# Patient Record
Sex: Female | Born: 1955
Health system: Southern US, Community
[De-identification: ages and names within clinical notes are randomized; demographics above are authoritative.]

## PROBLEM LIST (undated history)

## (undated) DIAGNOSIS — E119 Type 2 diabetes mellitus without complications: Secondary | ICD-10-CM

## (undated) DIAGNOSIS — Z992 Dependence on renal dialysis: Secondary | ICD-10-CM

## (undated) DIAGNOSIS — F329 Major depressive disorder, single episode, unspecified: Secondary | ICD-10-CM

## (undated) DIAGNOSIS — F32A Depression, unspecified: Secondary | ICD-10-CM

## (undated) DIAGNOSIS — M1990 Unspecified osteoarthritis, unspecified site: Secondary | ICD-10-CM

## (undated) DIAGNOSIS — N186 End stage renal disease: Secondary | ICD-10-CM

## (undated) DIAGNOSIS — E669 Obesity, unspecified: Secondary | ICD-10-CM

## (undated) DIAGNOSIS — N189 Chronic kidney disease, unspecified: Secondary | ICD-10-CM

## (undated) DIAGNOSIS — Z9889 Other specified postprocedural states: Secondary | ICD-10-CM

## (undated) DIAGNOSIS — Z789 Other specified health status: Secondary | ICD-10-CM

## (undated) DIAGNOSIS — F419 Anxiety disorder, unspecified: Secondary | ICD-10-CM

## (undated) DIAGNOSIS — R519 Headache, unspecified: Secondary | ICD-10-CM

## (undated) DIAGNOSIS — Z9981 Dependence on supplemental oxygen: Secondary | ICD-10-CM

## (undated) DIAGNOSIS — C50912 Malignant neoplasm of unspecified site of left female breast: Secondary | ICD-10-CM

## (undated) DIAGNOSIS — J189 Pneumonia, unspecified organism: Secondary | ICD-10-CM

## (undated) DIAGNOSIS — J449 Chronic obstructive pulmonary disease, unspecified: Secondary | ICD-10-CM

## (undated) DIAGNOSIS — I428 Other cardiomyopathies: Secondary | ICD-10-CM

## (undated) DIAGNOSIS — I10 Essential (primary) hypertension: Secondary | ICD-10-CM

## (undated) DIAGNOSIS — Z7902 Long term (current) use of antithrombotics/antiplatelets: Secondary | ICD-10-CM

## (undated) DIAGNOSIS — R0600 Dyspnea, unspecified: Secondary | ICD-10-CM

## (undated) DIAGNOSIS — Z7901 Long term (current) use of anticoagulants: Secondary | ICD-10-CM

## (undated) DIAGNOSIS — R112 Nausea with vomiting, unspecified: Secondary | ICD-10-CM

## (undated) DIAGNOSIS — I452 Bifascicular block: Secondary | ICD-10-CM

## (undated) DIAGNOSIS — D649 Anemia, unspecified: Secondary | ICD-10-CM

## (undated) DIAGNOSIS — I700 Atherosclerosis of aorta: Secondary | ICD-10-CM

## (undated) DIAGNOSIS — I509 Heart failure, unspecified: Secondary | ICD-10-CM

## (undated) DIAGNOSIS — I639 Cerebral infarction, unspecified: Secondary | ICD-10-CM

## (undated) DIAGNOSIS — Z794 Long term (current) use of insulin: Secondary | ICD-10-CM

## (undated) DIAGNOSIS — T7840XA Allergy, unspecified, initial encounter: Secondary | ICD-10-CM

## (undated) DIAGNOSIS — D631 Anemia in chronic kidney disease: Secondary | ICD-10-CM

## (undated) DIAGNOSIS — T8859XA Other complications of anesthesia, initial encounter: Secondary | ICD-10-CM

## (undated) DIAGNOSIS — M810 Age-related osteoporosis without current pathological fracture: Secondary | ICD-10-CM

## (undated) DIAGNOSIS — I5020 Unspecified systolic (congestive) heart failure: Secondary | ICD-10-CM

## (undated) DIAGNOSIS — I429 Cardiomyopathy, unspecified: Secondary | ICD-10-CM

## (undated) DIAGNOSIS — I739 Peripheral vascular disease, unspecified: Secondary | ICD-10-CM

## (undated) DIAGNOSIS — D72829 Elevated white blood cell count, unspecified: Secondary | ICD-10-CM

## (undated) HISTORY — DX: Chronic kidney disease, unspecified: N18.9

## (undated) HISTORY — DX: Depression, unspecified: F32.A

## (undated) HISTORY — DX: Cardiomyopathy, unspecified: I42.9

## (undated) HISTORY — PX: ABDOMINAL HYSTERECTOMY: SHX81

## (undated) HISTORY — DX: Essential (primary) hypertension: I10

## (undated) HISTORY — DX: Age-related osteoporosis without current pathological fracture: M81.0

## (undated) HISTORY — DX: Chronic obstructive pulmonary disease, unspecified: J44.9

## (undated) HISTORY — DX: Anemia, unspecified: D64.9

## (undated) HISTORY — DX: Heart failure, unspecified: I50.9

## (undated) HISTORY — DX: Type 2 diabetes mellitus without complications: E11.9

## (undated) HISTORY — DX: Obesity, unspecified: E66.9

## (undated) HISTORY — DX: Elevated white blood cell count, unspecified: D72.829

## (undated) HISTORY — DX: Anxiety disorder, unspecified: F41.9

## (undated) HISTORY — DX: Major depressive disorder, single episode, unspecified: F32.9

## (undated) HISTORY — PX: FRACTURE SURGERY: SHX138

## (undated) HISTORY — PX: US LT BREAST BX W LOC DEV 1ST LESION IMG BX SPEC US GUIDE: IMG5431

## (undated) HISTORY — DX: Allergy, unspecified, initial encounter: T78.40XA

## (undated) HISTORY — DX: Cerebral infarction, unspecified: I63.9

## (undated) MED FILL — Ferric Derisomaltose (One Dose) IV Sol 1000 MG/10ML (Fe Eq): INTRAVENOUS | Qty: 10 | Status: AC

---

## 1976-02-09 HISTORY — PX: TUBAL LIGATION: SHX77

## 1998-02-08 HISTORY — PX: BILATERAL SALPINGOOPHORECTOMY: SHX1223

## 2000-02-09 HISTORY — PX: ANKLE FRACTURE SURGERY: SHX122

## 2003-02-09 HISTORY — PX: KNEE ARTHROSCOPY: SUR90

## 2005-10-22 DIAGNOSIS — E785 Hyperlipidemia, unspecified: Secondary | ICD-10-CM | POA: Insufficient documentation

## 2005-10-22 DIAGNOSIS — M1990 Unspecified osteoarthritis, unspecified site: Secondary | ICD-10-CM | POA: Insufficient documentation

## 2005-10-22 DIAGNOSIS — E1169 Type 2 diabetes mellitus with other specified complication: Secondary | ICD-10-CM | POA: Insufficient documentation

## 2005-10-22 DIAGNOSIS — IMO0002 Reserved for concepts with insufficient information to code with codable children: Secondary | ICD-10-CM | POA: Insufficient documentation

## 2005-10-22 DIAGNOSIS — E1165 Type 2 diabetes mellitus with hyperglycemia: Secondary | ICD-10-CM | POA: Insufficient documentation

## 2005-10-22 DIAGNOSIS — F329 Major depressive disorder, single episode, unspecified: Secondary | ICD-10-CM | POA: Insufficient documentation

## 2005-10-22 DIAGNOSIS — F32A Depression, unspecified: Secondary | ICD-10-CM | POA: Insufficient documentation

## 2005-10-22 DIAGNOSIS — Z720 Tobacco use: Secondary | ICD-10-CM | POA: Insufficient documentation

## 2006-02-08 HISTORY — PX: ULNAR NERVE TRANSPOSITION: SHX2595

## 2006-05-27 DIAGNOSIS — R519 Headache, unspecified: Secondary | ICD-10-CM | POA: Insufficient documentation

## 2006-06-27 DIAGNOSIS — G43109 Migraine with aura, not intractable, without status migrainosus: Secondary | ICD-10-CM | POA: Insufficient documentation

## 2006-07-25 DIAGNOSIS — H9190 Unspecified hearing loss, unspecified ear: Secondary | ICD-10-CM | POA: Insufficient documentation

## 2006-08-04 ENCOUNTER — Emergency Department: Payer: Self-pay | Admitting: Unknown Physician Specialty

## 2006-09-02 DIAGNOSIS — R209 Unspecified disturbances of skin sensation: Secondary | ICD-10-CM | POA: Insufficient documentation

## 2006-11-18 ENCOUNTER — Ambulatory Visit: Payer: Self-pay | Admitting: Unknown Physician Specialty

## 2006-11-18 ENCOUNTER — Other Ambulatory Visit: Payer: Self-pay

## 2006-11-23 ENCOUNTER — Ambulatory Visit: Payer: Self-pay | Admitting: Unknown Physician Specialty

## 2007-04-18 ENCOUNTER — Emergency Department: Payer: Self-pay | Admitting: Emergency Medicine

## 2007-06-01 DIAGNOSIS — R197 Diarrhea, unspecified: Secondary | ICD-10-CM | POA: Insufficient documentation

## 2007-06-01 DIAGNOSIS — K921 Melena: Secondary | ICD-10-CM | POA: Insufficient documentation

## 2007-06-05 ENCOUNTER — Ambulatory Visit: Payer: Self-pay | Admitting: Family Medicine

## 2007-06-08 DIAGNOSIS — IMO0002 Reserved for concepts with insufficient information to code with codable children: Secondary | ICD-10-CM | POA: Insufficient documentation

## 2008-08-29 ENCOUNTER — Ambulatory Visit: Payer: Self-pay | Admitting: Family Medicine

## 2008-12-03 ENCOUNTER — Ambulatory Visit: Payer: Self-pay | Admitting: Family Medicine

## 2009-02-05 ENCOUNTER — Ambulatory Visit: Payer: Self-pay | Admitting: Family Medicine

## 2009-03-31 DIAGNOSIS — S0083XA Contusion of other part of head, initial encounter: Secondary | ICD-10-CM | POA: Insufficient documentation

## 2009-11-07 ENCOUNTER — Ambulatory Visit: Payer: Self-pay

## 2010-03-09 ENCOUNTER — Ambulatory Visit: Payer: Self-pay

## 2010-04-16 ENCOUNTER — Ambulatory Visit: Payer: Self-pay

## 2011-04-21 ENCOUNTER — Ambulatory Visit: Payer: Self-pay | Admitting: Sports Medicine

## 2011-04-28 ENCOUNTER — Ambulatory Visit: Payer: Self-pay | Admitting: Family Medicine

## 2012-03-27 ENCOUNTER — Ambulatory Visit: Payer: Self-pay | Admitting: Family Medicine

## 2012-05-23 ENCOUNTER — Emergency Department: Payer: Self-pay | Admitting: Emergency Medicine

## 2012-11-23 ENCOUNTER — Ambulatory Visit: Payer: Self-pay | Admitting: Family Medicine

## 2013-06-22 LAB — HEMOGLOBIN A1C: Hgb A1c MFr Bld: 13.3 % — AB (ref 4.0–6.0)

## 2013-10-11 DIAGNOSIS — E1165 Type 2 diabetes mellitus with hyperglycemia: Secondary | ICD-10-CM | POA: Diagnosis not present

## 2013-10-11 DIAGNOSIS — E11319 Type 2 diabetes mellitus with unspecified diabetic retinopathy without macular edema: Secondary | ICD-10-CM | POA: Diagnosis not present

## 2013-10-11 DIAGNOSIS — R809 Proteinuria, unspecified: Secondary | ICD-10-CM | POA: Diagnosis not present

## 2013-10-11 DIAGNOSIS — E1139 Type 2 diabetes mellitus with other diabetic ophthalmic complication: Secondary | ICD-10-CM | POA: Diagnosis not present

## 2013-10-11 DIAGNOSIS — F17200 Nicotine dependence, unspecified, uncomplicated: Secondary | ICD-10-CM | POA: Diagnosis not present

## 2013-12-04 DIAGNOSIS — F5109 Other insomnia not due to a substance or known physiological condition: Secondary | ICD-10-CM | POA: Diagnosis not present

## 2013-12-04 DIAGNOSIS — F331 Major depressive disorder, recurrent, moderate: Secondary | ICD-10-CM | POA: Diagnosis not present

## 2013-12-04 DIAGNOSIS — F411 Generalized anxiety disorder: Secondary | ICD-10-CM | POA: Diagnosis not present

## 2013-12-17 DIAGNOSIS — L304 Erythema intertrigo: Secondary | ICD-10-CM | POA: Diagnosis not present

## 2013-12-17 DIAGNOSIS — K130 Diseases of lips: Secondary | ICD-10-CM | POA: Diagnosis not present

## 2013-12-17 DIAGNOSIS — L400 Psoriasis vulgaris: Secondary | ICD-10-CM | POA: Diagnosis not present

## 2013-12-17 DIAGNOSIS — L4052 Psoriatic arthritis mutilans: Secondary | ICD-10-CM | POA: Diagnosis not present

## 2013-12-19 DIAGNOSIS — R809 Proteinuria, unspecified: Secondary | ICD-10-CM | POA: Diagnosis not present

## 2013-12-19 DIAGNOSIS — Z794 Long term (current) use of insulin: Secondary | ICD-10-CM | POA: Diagnosis not present

## 2013-12-19 DIAGNOSIS — E1129 Type 2 diabetes mellitus with other diabetic kidney complication: Secondary | ICD-10-CM | POA: Diagnosis not present

## 2013-12-19 DIAGNOSIS — Z23 Encounter for immunization: Secondary | ICD-10-CM | POA: Diagnosis not present

## 2013-12-19 DIAGNOSIS — F17200 Nicotine dependence, unspecified, uncomplicated: Secondary | ICD-10-CM | POA: Diagnosis not present

## 2013-12-23 DIAGNOSIS — F17200 Nicotine dependence, unspecified, uncomplicated: Secondary | ICD-10-CM | POA: Insufficient documentation

## 2013-12-23 DIAGNOSIS — IMO0002 Reserved for concepts with insufficient information to code with codable children: Secondary | ICD-10-CM | POA: Insufficient documentation

## 2013-12-23 DIAGNOSIS — R809 Proteinuria, unspecified: Secondary | ICD-10-CM | POA: Insufficient documentation

## 2013-12-23 DIAGNOSIS — E1129 Type 2 diabetes mellitus with other diabetic kidney complication: Secondary | ICD-10-CM | POA: Insufficient documentation

## 2013-12-23 DIAGNOSIS — E1165 Type 2 diabetes mellitus with hyperglycemia: Principal | ICD-10-CM

## 2013-12-23 DIAGNOSIS — Z794 Long term (current) use of insulin: Secondary | ICD-10-CM | POA: Insufficient documentation

## 2014-03-06 DIAGNOSIS — L400 Psoriasis vulgaris: Secondary | ICD-10-CM | POA: Diagnosis not present

## 2014-03-06 DIAGNOSIS — K130 Diseases of lips: Secondary | ICD-10-CM | POA: Diagnosis not present

## 2014-03-07 DIAGNOSIS — F331 Major depressive disorder, recurrent, moderate: Secondary | ICD-10-CM | POA: Diagnosis not present

## 2014-03-22 DIAGNOSIS — R197 Diarrhea, unspecified: Secondary | ICD-10-CM | POA: Diagnosis not present

## 2014-03-22 DIAGNOSIS — J0100 Acute maxillary sinusitis, unspecified: Secondary | ICD-10-CM | POA: Diagnosis not present

## 2014-03-22 LAB — CBC AND DIFFERENTIAL
HCT: 38 % (ref 36–46)
Hemoglobin: 12.4 g/dL (ref 12.0–16.0)
Neutrophils Absolute: 71 /uL
PLATELETS: 151 10*3/uL (ref 150–399)
WBC: 11.4 10*3/mL

## 2014-03-22 LAB — BASIC METABOLIC PANEL
BUN: 11 mg/dL (ref 4–21)
Creatinine: 1 mg/dL (ref 0.5–1.1)
Glucose: 348 mg/dL
Potassium: 4.2 mmol/L (ref 3.4–5.3)
SODIUM: 137 mmol/L (ref 137–147)

## 2014-04-10 DIAGNOSIS — L400 Psoriasis vulgaris: Secondary | ICD-10-CM | POA: Diagnosis not present

## 2014-04-23 DIAGNOSIS — L400 Psoriasis vulgaris: Secondary | ICD-10-CM | POA: Diagnosis not present

## 2014-04-23 DIAGNOSIS — L4052 Psoriatic arthritis mutilans: Secondary | ICD-10-CM | POA: Diagnosis not present

## 2014-04-25 DIAGNOSIS — L400 Psoriasis vulgaris: Secondary | ICD-10-CM | POA: Diagnosis not present

## 2014-05-02 DIAGNOSIS — L400 Psoriasis vulgaris: Secondary | ICD-10-CM | POA: Diagnosis not present

## 2014-05-07 DIAGNOSIS — F331 Major depressive disorder, recurrent, moderate: Secondary | ICD-10-CM | POA: Diagnosis not present

## 2014-05-09 DIAGNOSIS — L400 Psoriasis vulgaris: Secondary | ICD-10-CM | POA: Diagnosis not present

## 2014-05-14 DIAGNOSIS — L400 Psoriasis vulgaris: Secondary | ICD-10-CM | POA: Diagnosis not present

## 2014-05-16 DIAGNOSIS — L400 Psoriasis vulgaris: Secondary | ICD-10-CM | POA: Diagnosis not present

## 2014-05-21 DIAGNOSIS — L400 Psoriasis vulgaris: Secondary | ICD-10-CM | POA: Diagnosis not present

## 2014-05-23 DIAGNOSIS — L400 Psoriasis vulgaris: Secondary | ICD-10-CM | POA: Diagnosis not present

## 2014-05-28 DIAGNOSIS — L400 Psoriasis vulgaris: Secondary | ICD-10-CM | POA: Diagnosis not present

## 2014-05-30 DIAGNOSIS — L400 Psoriasis vulgaris: Secondary | ICD-10-CM | POA: Diagnosis not present

## 2014-06-05 DIAGNOSIS — Z8679 Personal history of other diseases of the circulatory system: Secondary | ICD-10-CM | POA: Insufficient documentation

## 2014-06-05 DIAGNOSIS — J449 Chronic obstructive pulmonary disease, unspecified: Secondary | ICD-10-CM | POA: Insufficient documentation

## 2014-06-05 DIAGNOSIS — L4052 Psoriatic arthritis mutilans: Secondary | ICD-10-CM | POA: Diagnosis not present

## 2014-06-05 DIAGNOSIS — G4709 Other insomnia: Secondary | ICD-10-CM | POA: Insufficient documentation

## 2014-06-05 DIAGNOSIS — Z8669 Personal history of other diseases of the nervous system and sense organs: Secondary | ICD-10-CM | POA: Insufficient documentation

## 2014-06-05 DIAGNOSIS — F331 Major depressive disorder, recurrent, moderate: Secondary | ICD-10-CM | POA: Insufficient documentation

## 2014-06-05 DIAGNOSIS — F411 Generalized anxiety disorder: Secondary | ICD-10-CM | POA: Insufficient documentation

## 2014-06-05 DIAGNOSIS — Z8739 Personal history of other diseases of the musculoskeletal system and connective tissue: Secondary | ICD-10-CM | POA: Insufficient documentation

## 2014-06-05 DIAGNOSIS — Z8639 Personal history of other endocrine, nutritional and metabolic disease: Secondary | ICD-10-CM | POA: Insufficient documentation

## 2014-06-05 DIAGNOSIS — Z79899 Other long term (current) drug therapy: Secondary | ICD-10-CM | POA: Diagnosis not present

## 2014-06-05 DIAGNOSIS — L400 Psoriasis vulgaris: Secondary | ICD-10-CM | POA: Diagnosis not present

## 2014-06-10 DIAGNOSIS — E1129 Type 2 diabetes mellitus with other diabetic kidney complication: Secondary | ICD-10-CM | POA: Diagnosis not present

## 2014-06-10 DIAGNOSIS — E1165 Type 2 diabetes mellitus with hyperglycemia: Secondary | ICD-10-CM | POA: Diagnosis not present

## 2014-06-11 DIAGNOSIS — L400 Psoriasis vulgaris: Secondary | ICD-10-CM | POA: Diagnosis not present

## 2014-06-17 DIAGNOSIS — Z794 Long term (current) use of insulin: Secondary | ICD-10-CM | POA: Diagnosis not present

## 2014-06-17 DIAGNOSIS — E1129 Type 2 diabetes mellitus with other diabetic kidney complication: Secondary | ICD-10-CM | POA: Diagnosis not present

## 2014-06-17 DIAGNOSIS — F17200 Nicotine dependence, unspecified, uncomplicated: Secondary | ICD-10-CM | POA: Diagnosis not present

## 2014-06-17 DIAGNOSIS — E11319 Type 2 diabetes mellitus with unspecified diabetic retinopathy without macular edema: Secondary | ICD-10-CM | POA: Diagnosis not present

## 2014-06-18 DIAGNOSIS — L400 Psoriasis vulgaris: Secondary | ICD-10-CM | POA: Diagnosis not present

## 2014-06-20 DIAGNOSIS — L400 Psoriasis vulgaris: Secondary | ICD-10-CM | POA: Diagnosis not present

## 2014-06-27 DIAGNOSIS — R197 Diarrhea, unspecified: Secondary | ICD-10-CM | POA: Diagnosis not present

## 2014-06-27 DIAGNOSIS — L400 Psoriasis vulgaris: Secondary | ICD-10-CM | POA: Diagnosis not present

## 2014-06-27 DIAGNOSIS — J0100 Acute maxillary sinusitis, unspecified: Secondary | ICD-10-CM | POA: Diagnosis not present

## 2014-07-02 DIAGNOSIS — L400 Psoriasis vulgaris: Secondary | ICD-10-CM | POA: Diagnosis not present

## 2014-07-05 DIAGNOSIS — I152 Hypertension secondary to endocrine disorders: Secondary | ICD-10-CM | POA: Insufficient documentation

## 2014-07-05 DIAGNOSIS — I10 Essential (primary) hypertension: Secondary | ICD-10-CM | POA: Insufficient documentation

## 2014-07-05 DIAGNOSIS — R030 Elevated blood-pressure reading, without diagnosis of hypertension: Secondary | ICD-10-CM

## 2014-07-05 DIAGNOSIS — E1159 Type 2 diabetes mellitus with other circulatory complications: Secondary | ICD-10-CM | POA: Insufficient documentation

## 2014-07-05 DIAGNOSIS — L0292 Furuncle, unspecified: Secondary | ICD-10-CM | POA: Insufficient documentation

## 2014-07-05 DIAGNOSIS — R6889 Other general symptoms and signs: Secondary | ICD-10-CM | POA: Insufficient documentation

## 2014-07-05 DIAGNOSIS — L0293 Carbuncle, unspecified: Secondary | ICD-10-CM

## 2014-07-05 DIAGNOSIS — K6289 Other specified diseases of anus and rectum: Secondary | ICD-10-CM | POA: Insufficient documentation

## 2014-07-05 DIAGNOSIS — R42 Dizziness and giddiness: Secondary | ICD-10-CM | POA: Insufficient documentation

## 2014-07-05 DIAGNOSIS — L404 Guttate psoriasis: Secondary | ICD-10-CM | POA: Insufficient documentation

## 2014-07-05 DIAGNOSIS — M25569 Pain in unspecified knee: Secondary | ICD-10-CM | POA: Insufficient documentation

## 2014-07-09 DIAGNOSIS — Z794 Long term (current) use of insulin: Secondary | ICD-10-CM | POA: Diagnosis not present

## 2014-07-09 DIAGNOSIS — R809 Proteinuria, unspecified: Secondary | ICD-10-CM | POA: Diagnosis not present

## 2014-07-09 DIAGNOSIS — E1129 Type 2 diabetes mellitus with other diabetic kidney complication: Secondary | ICD-10-CM | POA: Diagnosis not present

## 2014-07-09 DIAGNOSIS — F17200 Nicotine dependence, unspecified, uncomplicated: Secondary | ICD-10-CM | POA: Diagnosis not present

## 2014-07-10 DIAGNOSIS — L400 Psoriasis vulgaris: Secondary | ICD-10-CM | POA: Diagnosis not present

## 2014-07-10 DIAGNOSIS — L4052 Psoriatic arthritis mutilans: Secondary | ICD-10-CM | POA: Diagnosis not present

## 2014-07-16 DIAGNOSIS — L400 Psoriasis vulgaris: Secondary | ICD-10-CM | POA: Diagnosis not present

## 2014-07-23 DIAGNOSIS — L400 Psoriasis vulgaris: Secondary | ICD-10-CM | POA: Diagnosis not present

## 2014-08-02 ENCOUNTER — Other Ambulatory Visit: Payer: Self-pay | Admitting: Family Medicine

## 2014-08-03 ENCOUNTER — Other Ambulatory Visit: Payer: Self-pay | Admitting: Family Medicine

## 2014-08-06 ENCOUNTER — Other Ambulatory Visit: Payer: Self-pay

## 2014-08-06 ENCOUNTER — Encounter: Payer: Self-pay | Admitting: Psychiatry

## 2014-08-06 ENCOUNTER — Ambulatory Visit (INDEPENDENT_AMBULATORY_CARE_PROVIDER_SITE_OTHER): Payer: Medicare Other | Admitting: Psychiatry

## 2014-08-06 VITALS — BP 140/92 | HR 102 | Temp 97.4°F | Ht 66.0 in | Wt 216.8 lb

## 2014-08-06 DIAGNOSIS — F331 Major depressive disorder, recurrent, moderate: Secondary | ICD-10-CM

## 2014-08-06 DIAGNOSIS — L400 Psoriasis vulgaris: Secondary | ICD-10-CM | POA: Diagnosis not present

## 2014-08-06 MED ORDER — PAROXETINE HCL 40 MG PO TABS: 40.0000 mg | ORAL_TABLET | Freq: Every day | ORAL | Status: DC

## 2014-08-06 MED ORDER — CLONAZEPAM 0.5 MG PO TABS: 0.5000 mg | ORAL_TABLET | Freq: Every day | ORAL | Status: DC

## 2014-08-06 NOTE — Progress Notes (Signed)
BH MD/PA/NP OP Progress Note  08/06/2014 1:22 PM Jocelyn Wells  MRN:  [id number]  Subjective:   Pt is  a 59 year old female who presented for follow-up appointment.her blood pressure was slightly elevated at the time of this interview. She reported that she has been having severe headache for the past couple of days since she came back from the cruise trip. She reported that she has been trying to get some rest and has not been eating well for the past couple of days. She is compliant with her blood pressure medications. She reported that she has never been diagnosed with migraine headaches in the past. Patient reported that she is not sleeping well and takes Klonopin on a when necessary basis. She also takes Paxil to help with her depression and anxiety and is not having any adverse effects related to the medication. Patient reported that she will follow-up with her primary care physician regarding her elevated blood pressure.   Chief Complaint:  Chief Complaint    Follow-up; Depression     Visit Diagnosis:     ICD-9-CM ICD-10-CM   1. MDD (major depressive disorder), recurrent episode, moderate 296.32 F33.1     Past Medical History:  Past Medical History  Diagnosis Date  . Anxiety   . Depression   . Diabetes mellitus, type II     Past Surgical History  Procedure Laterality Date  . Abdominal hysterectomy    . Tubal ligation  1978  . Cesarean section  1978  . Knee arthroscopy Left 2005  . Ulnar nerve transposition  2008  . Bilateral salpingoophorectomy  2000  . Ankle fracture surgery Left 2002   Family History:  Family History  Problem Relation Age of Onset  . Heart disease Mother     died from CHF  . Asthma Mother   . Diabetes Mother   . Heart disease Father   . Aneurysm Father   . Diabetes Brother   . Alcohol abuse Paternal Aunt    Social History:  History   Social History  . Marital Status: Married    Spouse Name: N/A  . Number of Children: N/A  . Years of  Education: N/A   Social History Main Topics  . Smoking status: Current Every Day Smoker -- 1.00 packs/day for 35 years    Types: Cigarettes    Start date: 02/08/1974  . Smokeless tobacco: Never Used  . Alcohol Use: No  . Drug Use: No  . Sexual Activity: Yes    Birth Control/ Protection: None   Other Topics Concern  . None   Social History Narrative   Additional History:  Married x 18 years. Has 2 children and Her husband has 2 children as well.she went to the cruise trip with her grandchildren.    Assessment:   Musculoskeletal: Strength & Muscle Tone: within normal limits Gait & Station: normal Patient leans: N/A  Psychiatric Specialty Exam: HPI  Review of Systems  Constitutional: Negative for chills.  HENT: Negative for nosebleeds and tinnitus.   Eyes: Negative for double vision and discharge.  Respiratory: Positive for cough.   Cardiovascular: Negative for orthopnea.  Gastrointestinal: Positive for nausea, vomiting and diarrhea. Negative for blood in stool.  Genitourinary: Negative for urgency.  Musculoskeletal: Positive for joint pain. Negative for back pain.  Skin: Negative for rash.  Neurological: Positive for headaches. Negative for tingling, sensory change and focal weakness.  Endo/Heme/Allergies: Negative for environmental allergies.  Psychiatric/Behavioral: Positive for depression. The patient is nervous/anxious.  Blood pressure 140/92, pulse 102, temperature 97.4 F (36.3 C), temperature source Tympanic, height 5\' 6"  (1.676 m), weight 216 lb 12.8 oz (98.34 kg), SpO2 91 %.Body mass index is 35.01 kg/(m^2).  General Appearance: Casual  Eye Contact:  Fair  Speech:  Clear and Coherent  Volume:  Normal  Mood:  Depressed  Affect:  Depressed  Thought Process:  Coherent  Orientation:  Full (Time, Place, and Person)  Thought Content:  WDL  Suicidal Thoughts:  No  Homicidal Thoughts:  No  Memory:  Immediate;   Fair  Judgement:  Fair  Insight:  Fair   Psychomotor Activity:  Normal  Concentration:  Fair  Recall:  [employer] of Knowledge: Fair  Language: Fair  Akathisia:  No  Handed:  Right  AIMS (if indicated):  none  Assets:  Communication Skills Desire for Improvement Physical Health Social Support  ADL's:  Intact  Cognition: WNL  Sleep:  6-7   Is the patient at risk to self?  No. Has the patient been a risk to self in the past 6 months?  No. Has the patient been a risk to self within the distant past?  No. Is the patient a risk to others?  No. Has the patient been a risk to others in the past 6 months?  No. Has the patient been a risk to others within the distant past?  No.  Current Medications: Current Outpatient Prescriptions  Medication Sig Dispense Refill  . atorvastatin (LIPITOR) 10 MG tablet Take 1 tablet by mouth daily.    . benzonatate (TESSALON) 100 MG capsule TAKE 1 CAPSULE BY MOUTH AT BEDTIME 30 capsule 1  . clonazePAM (KLONOPIN) 0.5 MG tablet Take 1 tablet by mouth at bedtime.    . fluticasone (FLONASE) 50 MCG/ACT nasal spray Place 2 sprays into the nose daily.    Marland Kitchen glyBURIDE-metformin (GLUCOVANCE) 5-500 MG per tablet Take 1 tablet by mouth 2 (two) times daily.    . Insulin Glargine (LANTUS) 100 UNIT/ML Solostar Pen Inject 52 Units into the skin daily.     . Insulin Glargine 300 UNIT/ML SOPN Inject 300 Units into the skin daily.     Marland Kitchen ketoconazole (NIZORAL) 2 % cream     . loratadine (CLARITIN) 10 MG tablet Take 1 tablet by mouth daily.    Marland Kitchen losartan (COZAAR) 50 MG tablet Take 1 tablet by mouth daily.    . ONE TOUCH ULTRA TEST test strip     . ONETOUCH DELICA LANCETS FINE MISC     . PARoxetine (PAXIL) 40 MG tablet Take 1 tablet by mouth daily.    . simvastatin (ZOCOR) 40 MG tablet Take 1 tablet by mouth daily.    Marland Kitchen amoxicillin-clavulanate (AUGMENTIN) 875-125 MG per tablet     . Apremilast 30 MG TABS Take 2 tablets by mouth daily.    . benzonatate (TESSALON) 100 MG capsule TAKE 1 CAPSULE BY MOUTH AT BEDTIME  FOR COUGH (Patient not taking: Reported on 08/06/2014) 30 capsule 1  . benzonatate (TESSALON) 100 MG capsule TAKE ONE CAPSULE BY MOUTH AT BEDTIME (Patient not taking: Reported on 08/06/2014) 30 capsule 1  . benzonatate (TESSALON) 100 MG capsule TAKE ONE CAPSULE BY MOUTH EVERY NIGHT AT BEDTIME (Patient not taking: Reported on 08/06/2014) 30 capsule 1  . benzonatate (TESSALON) 100 MG capsule TAKE ONE CAPSULE BY MOUTH AT BEDTIME (Patient not taking: Reported on 08/06/2014) 30 capsule 1  . diclofenac (VOLTAREN) 75 MG EC tablet Take 1 tablet by mouth 2 (two) times  daily as needed.     No current facility-administered medications for this visit.    Medical Decision Making:  Established Problem, Stable/Improving (1), Review of Psycho-Social Stressors (1) and Review of Last Therapy Session (1)  Treatment Plan Summary:Medication management  Discussed with patient about her medications and advised her to follow up with the primary care physician regarding elevated blood pressure and she demonstrated understanding. She will continue on Paxil 40 mg by mouth daily Will continue on Klonopin 0.5 mg half to one pill daily Follow-up in 2 months or earlier depending on her symptoms   More than 50% of the time spent in psychoeducation, counseling and coordination of care.    This note was generated in part or whole with voice recognition software. Voice regonition is usually quite accurate but there are transcription errors that can and very often do occur. I apologize for any typographical errors that were not detected and corrected.   Rainey Pines, MD

## 2014-08-13 DIAGNOSIS — L400 Psoriasis vulgaris: Secondary | ICD-10-CM | POA: Diagnosis not present

## 2014-08-26 DIAGNOSIS — L400 Psoriasis vulgaris: Secondary | ICD-10-CM | POA: Diagnosis not present

## 2014-09-09 DIAGNOSIS — M25562 Pain in left knee: Secondary | ICD-10-CM | POA: Diagnosis not present

## 2014-09-09 DIAGNOSIS — M25561 Pain in right knee: Secondary | ICD-10-CM | POA: Diagnosis not present

## 2014-09-09 DIAGNOSIS — M25571 Pain in right ankle and joints of right foot: Secondary | ICD-10-CM | POA: Diagnosis not present

## 2014-09-09 DIAGNOSIS — M1990 Unspecified osteoarthritis, unspecified site: Secondary | ICD-10-CM | POA: Diagnosis not present

## 2014-09-09 DIAGNOSIS — L400 Psoriasis vulgaris: Secondary | ICD-10-CM | POA: Diagnosis not present

## 2014-09-18 DIAGNOSIS — E1129 Type 2 diabetes mellitus with other diabetic kidney complication: Secondary | ICD-10-CM | POA: Diagnosis not present

## 2014-09-18 DIAGNOSIS — M25571 Pain in right ankle and joints of right foot: Secondary | ICD-10-CM | POA: Diagnosis not present

## 2014-09-18 DIAGNOSIS — M1990 Unspecified osteoarthritis, unspecified site: Secondary | ICD-10-CM | POA: Diagnosis not present

## 2014-09-23 DIAGNOSIS — E11319 Type 2 diabetes mellitus with unspecified diabetic retinopathy without macular edema: Secondary | ICD-10-CM | POA: Diagnosis not present

## 2014-09-23 DIAGNOSIS — Z794 Long term (current) use of insulin: Secondary | ICD-10-CM | POA: Diagnosis not present

## 2014-09-23 DIAGNOSIS — E1129 Type 2 diabetes mellitus with other diabetic kidney complication: Secondary | ICD-10-CM | POA: Diagnosis not present

## 2014-09-23 DIAGNOSIS — R809 Proteinuria, unspecified: Secondary | ICD-10-CM | POA: Diagnosis not present

## 2014-09-24 DIAGNOSIS — L400 Psoriasis vulgaris: Secondary | ICD-10-CM | POA: Diagnosis not present

## 2014-09-25 DIAGNOSIS — G8929 Other chronic pain: Secondary | ICD-10-CM | POA: Diagnosis not present

## 2014-09-25 DIAGNOSIS — M25562 Pain in left knee: Secondary | ICD-10-CM | POA: Diagnosis not present

## 2014-09-25 DIAGNOSIS — M25561 Pain in right knee: Secondary | ICD-10-CM | POA: Diagnosis not present

## 2014-09-25 DIAGNOSIS — M25571 Pain in right ankle and joints of right foot: Secondary | ICD-10-CM | POA: Diagnosis not present

## 2014-09-27 ENCOUNTER — Telehealth: Payer: Self-pay | Admitting: Family Medicine

## 2014-09-27 NOTE — Telephone Encounter (Signed)
Jocelyn Wells with [facility] call for pt stating pt is needing a colonoscopy.  CB# for pt [phone]/MW

## 2014-09-27 NOTE — Telephone Encounter (Signed)
Is it okay to order a colonoscopy? Please advise.

## 2014-10-03 ENCOUNTER — Ambulatory Visit: Payer: Medicare Other | Admitting: Psychiatry

## 2014-10-03 ENCOUNTER — Other Ambulatory Visit: Payer: Self-pay

## 2014-10-03 MED ORDER — CLONAZEPAM 0.5 MG PO TABS: 0.5000 mg | ORAL_TABLET | Freq: Every day | ORAL | Status: DC

## 2014-10-03 MED ORDER — PAROXETINE HCL 40 MG PO TABS: 40.0000 mg | ORAL_TABLET | Freq: Every day | ORAL | Status: DC

## 2014-10-03 NOTE — Telephone Encounter (Signed)
pt needs refill on paxil and klonoplin [facility] [address]

## 2014-10-05 ENCOUNTER — Other Ambulatory Visit: Payer: Self-pay | Admitting: Family Medicine

## 2014-10-07 NOTE — Telephone Encounter (Signed)
This has been reorder, not discontinued.

## 2014-10-08 NOTE — Telephone Encounter (Signed)
Advise patient she is due for follow up appointment for diabetes and discuss need for colonoscopy.

## 2014-10-08 NOTE — Telephone Encounter (Signed)
LMTCB

## 2014-10-08 NOTE — Telephone Encounter (Signed)
Patient advised as directed below. Patient scheduled for a follow up appointment on 10/11/14. Patient states she does not know Caryl Pina with [employer] [facility] which is the person that requested a order for a colonoscopy.

## 2014-10-09 ENCOUNTER — Other Ambulatory Visit: Payer: Self-pay

## 2014-10-10 DIAGNOSIS — L400 Psoriasis vulgaris: Secondary | ICD-10-CM | POA: Diagnosis not present

## 2014-10-10 DIAGNOSIS — L4052 Psoriatic arthritis mutilans: Secondary | ICD-10-CM | POA: Diagnosis not present

## 2014-10-11 ENCOUNTER — Other Ambulatory Visit: Payer: Self-pay

## 2014-10-11 ENCOUNTER — Encounter: Payer: Self-pay | Admitting: Family Medicine

## 2014-10-11 ENCOUNTER — Ambulatory Visit (INDEPENDENT_AMBULATORY_CARE_PROVIDER_SITE_OTHER): Payer: Medicare Other | Admitting: Family Medicine

## 2014-10-11 VITALS — BP 142/90 | HR 68 | Temp 97.6°F | Resp 16 | Wt 212.2 lb

## 2014-10-11 DIAGNOSIS — Z8639 Personal history of other endocrine, nutritional and metabolic disease: Secondary | ICD-10-CM | POA: Diagnosis not present

## 2014-10-11 DIAGNOSIS — J069 Acute upper respiratory infection, unspecified: Secondary | ICD-10-CM

## 2014-10-11 DIAGNOSIS — Z9889 Other specified postprocedural states: Secondary | ICD-10-CM

## 2014-10-11 NOTE — Progress Notes (Signed)
Patient ID: Jocelyn Wells, female   DOB: 1955/12/16, 59 y.o.   MRN: [id number]   Chief Complaint  Patient presents with  . Follow-up    discuss need for colonoscopy  . URI    X 1 week   Subjective:  HPI  This 59 year old female had contact from [place] at [facility] and was told she may need a colonoscopy. States she had normal colonoscopies in 2005 in [place] and in 2007 in [place]. Declines colonoscopy now. No symptoms of GI upset, hematemesis, hematochezia or melena. No abdominal pain. Onset of ticklish cough and nasal congestion with sneezing. Slightly yellow nasal mucus and sputum in the mornings. Denies fever, earache or sore throat. Ears feel a little stopped up.   Family History  Problem Relation Age of Onset  . Heart disease Mother     died from CHF  . Asthma Mother   . Diabetes Mother   . Heart disease Father   . Aneurysm Father   . Diabetes Brother   . Alcohol abuse Paternal Aunt      Prior to Admission medications   Medication Sig Start Date End Date Taking? Authorizing Provider  APIDRA SOLOSTAR 100 UNIT/ML Solostar Pen  09/15/14  Yes Historical Provider, MD  Apremilast 30 MG TABS Take 2 tablets by mouth daily.   Yes Historical Provider, MD  atorvastatin (LIPITOR) 10 MG tablet Take 1 tablet by mouth daily.   Yes Historical Provider, MD  benzonatate (TESSALON) 100 MG capsule  09/28/14  Yes Historical Provider, MD  clonazePAM (KLONOPIN) 0.5 MG tablet Take 1 tablet (0.5 mg total) by mouth at bedtime. 10/03/14  Yes Rainey Pines, MD  diclofenac (VOLTAREN) 75 MG EC tablet TAKE 1 TABLET BY MOUTH TWICE A DAY AS NEEDED 10/08/14  Yes Antoinett Dorman E Tanush Drees, PA  fluticasone (FLONASE) 50 MCG/ACT nasal spray Place 2 sprays into the nose daily. 06/27/14  Yes Historical Provider, MD  glyBURIDE-metformin (GLUCOVANCE) 5-500 MG per tablet Take 1 tablet by mouth 2 (two) times daily.   Yes Historical Provider, MD  2       Insulin Glargine 300 UNIT/ML SOPN Inject 60 Units into the skin  daily.    Yes Historical Provider, MD  ketoconazole (NIZORAL) 2 % cream  12/17/13  Yes Historical Provider, MD  loratadine (CLARITIN) 10 MG tablet Take 1 tablet by mouth daily.   Yes Historical Provider, MD  losartan (COZAAR) 50 MG tablet Take 1 tablet by mouth daily.   Yes Historical Provider, MD  ONE TOUCH ULTRA TEST test strip  06/19/14  Yes Historical Provider, MD  [place]  06/19/14  Yes Historical Provider, MD  PARoxetine (PAXIL) 40 MG tablet Take 1 tablet (40 mg total) by mouth daily. 10/03/14  Yes Rainey Pines, MD  simvastatin (ZOCOR) 40 MG tablet Take 1 tablet by mouth daily.   Yes Historical Provider, MD    Patient Active Problem List   Diagnosis Date Noted  . Dizziness and giddiness 07/05/2014  . Blood pressure elevated 07/05/2014  . Carbuncle and furuncle 07/05/2014  . BP (high blood pressure) 07/05/2014  . Does not feel right 07/05/2014  . Pain of perianal area 07/05/2014  . Guttate psoriasis 07/05/2014  . Gonalgia 07/05/2014  . H/O diabetes mellitus 06/05/2014  . CAFL (chronic airflow limitation) 06/05/2014  . H/O visual disturbance 06/05/2014  . H/O elevated lipids 06/05/2014  . H/O: HTN (hypertension) 06/05/2014  . H/O: osteoarthritis 06/05/2014  . Initial insomnia 06/05/2014  . Anxiety, generalized  06/05/2014  . Depression, major, recurrent, moderate 06/05/2014  . Microalbuminuria 12/23/2013  . Long term current use of insulin 12/23/2013  . Compulsive tobacco user syndrome 12/23/2013  . Contusion of cheek 03/31/2009  . Change in blood platelet count 06/08/2007  . Blood in feces 06/01/2007  . D (diarrhea) 06/01/2007  . Disturbance of skin sensation 09/02/2006  . Difficulty hearing 07/25/2006  . Acute onset aura migraine 06/27/2006  . Cephalalgia 05/27/2006  . Clinical depression 10/22/2005  . Diabetes mellitus type 2, uncontrolled 10/22/2005  . HLD (hyperlipidemia) 10/22/2005  . Arthritis, degenerative 10/22/2005  . Current tobacco use  10/22/2005    Past Medical History  Diagnosis Date  . Anxiety   . Depression   . Diabetes mellitus, type II     Social History   Social History  . Marital Status: Married    Spouse Name: N/A  . Number of Children: N/A  . Years of Education: N/A   Occupational History  . Not on file.   Social History Main Topics  . Smoking status: Current Every Day Smoker -- 1.00 packs/day for 35 years    Types: Cigarettes    Start date: 02/08/1974  . Smokeless tobacco: Never Used  . Alcohol Use: No  . Drug Use: No  . Sexual Activity: Yes    Birth Control/ Protection: None   Other Topics Concern  . Not on file   Social History Narrative   Allergies  Allergen Reactions  . Codeine Nausea And Vomiting    Review of Systems  Constitutional: Negative.   HENT: Positive for congestion and sore throat.   Eyes: Negative.   Respiratory: Positive for cough.   Cardiovascular: Negative.   Gastrointestinal: Negative.   Genitourinary: Negative.   Musculoskeletal: Negative.   Skin: Negative.   Neurological: Negative.   Endo/Heme/Allergies: Negative.   Psychiatric/Behavioral: Negative.     Immunization History  Administered Date(s) Administered  . Pneumococcal Polysaccharide-23 12/23/2010   Objective:  BP 142/90 mmHg  Pulse 68  Temp(Src) 97.6 F (36.4 C) (Oral)  Resp 16  Wt 212 lb 3.2 oz (96.253 kg)  Physical Exam  Constitutional: She is oriented to person, place, and time and well-developed, well-nourished, and in no distress.  HENT:  Head: Normocephalic and atraumatic.  Right Ear: External ear normal.  Slightly swollen turbinates and cobblestoning of posterior pharynx. No exudates or purulent nasal discharge.  Eyes: Conjunctivae and EOM are normal.  Neck: Normal range of motion. Neck supple.  Cardiovascular: Normal rate.   Pulmonary/Chest: Effort normal and breath sounds normal.  Abdominal: Soft. Bowel sounds are normal.  Lymphadenopathy:    She has no cervical  adenopathy.  Neurological: She is alert and oriented to person, place, and time.  Skin: No rash noted.  Psychiatric: Memory, affect and judgment normal.    Lab Results  Component Value Date   WBC 11.4 03/22/2014   HGB 12.4 03/22/2014   HCT 38 03/22/2014   PLT 151 03/22/2014   HGBA1C 13.3* 06/22/2013    CMP     Component Value Date/Time   NA 137 03/22/2014   K 4.2 03/22/2014   BUN 11 03/22/2014   CREATININE 1.0 03/22/2014    Assessment and Plan :   1. Upper respiratory infection Onset over the past week. Denies fever or purulent sputum production. Having nasal congestion with scratchy sore throat from post nasal drip. Recommend she restart Claritin and Flonase nasal spray. Will add the Benzonatate for cough control and recheck if no better in a  week.  2. H/O diabetes mellitus Followed by Dr. Gabriel Carina and had Hgb 9.4 on 09-18-14 (was 13.3 on 06-22-13). Presently on Toujeo 60 units q pm with Glyburide-Metformin 5/500 mg BID. Has ophthalmology exam scheduled next week for retinal exam. Urinary microalbumin was 74 mg.L per Dr. Gabriel Carina on 09-18-14.  3. History of colonoscopy Has had colonoscopy in [place] in 2005 and in [place] in 2007 without malignant findings. Denies any GI symptoms or concerns. No need for another colonoscopy this year.   [facility] 10/11/2014 11:56 AM

## 2014-10-17 ENCOUNTER — Ambulatory Visit: Payer: Medicare Other | Admitting: Psychiatry

## 2014-10-19 ENCOUNTER — Other Ambulatory Visit: Payer: Self-pay | Admitting: Family Medicine

## 2014-10-22 ENCOUNTER — Ambulatory Visit: Payer: Medicare Other | Admitting: Psychiatry

## 2014-11-04 DIAGNOSIS — M542 Cervicalgia: Secondary | ICD-10-CM | POA: Diagnosis not present

## 2014-11-04 DIAGNOSIS — M25562 Pain in left knee: Secondary | ICD-10-CM | POA: Diagnosis not present

## 2014-11-04 DIAGNOSIS — M25561 Pain in right knee: Secondary | ICD-10-CM | POA: Diagnosis not present

## 2014-11-04 DIAGNOSIS — G8929 Other chronic pain: Secondary | ICD-10-CM | POA: Diagnosis not present

## 2014-11-04 DIAGNOSIS — R5383 Other fatigue: Secondary | ICD-10-CM | POA: Diagnosis not present

## 2014-11-04 DIAGNOSIS — L400 Psoriasis vulgaris: Secondary | ICD-10-CM | POA: Diagnosis not present

## 2014-11-10 ENCOUNTER — Other Ambulatory Visit: Payer: Self-pay | Admitting: Psychiatry

## 2014-11-12 ENCOUNTER — Ambulatory Visit: Payer: Medicare Other | Admitting: Psychiatry

## 2014-11-19 ENCOUNTER — Ambulatory Visit (INDEPENDENT_AMBULATORY_CARE_PROVIDER_SITE_OTHER): Payer: Medicare Other | Admitting: Psychiatry

## 2014-11-19 ENCOUNTER — Encounter: Payer: Self-pay | Admitting: Psychiatry

## 2014-11-19 VITALS — BP 142/84 | HR 104 | Temp 98.0°F | Ht 66.0 in | Wt 211.4 lb

## 2014-11-19 DIAGNOSIS — F331 Major depressive disorder, recurrent, moderate: Secondary | ICD-10-CM

## 2014-11-19 MED ORDER — CLONAZEPAM 0.5 MG PO TABS: 0.5000 mg | ORAL_TABLET | Freq: Every day | ORAL | Status: DC

## 2014-11-19 MED ORDER — PAROXETINE HCL 40 MG PO TABS: 40.0000 mg | ORAL_TABLET | Freq: Every day | ORAL | Status: DC

## 2014-11-19 NOTE — Progress Notes (Signed)
BH MD/PA/NP OP Progress Note  11/19/2014 4:10 PM Jocelyn Wells  MRN:  [id number]  Subjective:   Pt is  a 59 year old female who presented for follow-up appointment. She stated that she is currently smoking one pack of cigarettes on a daily basis but is interested in smoking cessation classes. Patient smells strongly of cigarettes at this time. She also mentioned that she drinks iced tea for the whole day and has lost some weight. She feels that she is doing well and her blood sugar is under control. Patient reported that she is improving physically and is excited about the same. Patient currently denied having any more swings anger anxiety or paranoia. Patient reported that Paxil is helping with her depressive and anxiety symptoms. She takes Klonopin on a when necessary basis. She appeared calm and collective during the interview.   Chief Complaint:  Chief Complaint    Follow-up; Medication Refill     Visit Diagnosis:     ICD-9-CM ICD-10-CM   1. MDD (major depressive disorder), recurrent episode, moderate ([place]) 296.32 F33.1     Past Medical History:  Past Medical History  Diagnosis Date  . Anxiety   . Depression   . Diabetes mellitus, type II [facility])     Past Surgical History  Procedure Laterality Date  . Abdominal hysterectomy    . Tubal ligation  1978  . Cesarean section  1978  . Knee arthroscopy Left 2005  . Ulnar nerve transposition  2008  . Bilateral salpingoophorectomy  2000  . Ankle fracture surgery Left 2002   Family History:  Family History  Problem Relation Age of Onset  . Heart disease Mother     died from CHF  . Asthma Mother   . Diabetes Mother   . Heart disease Father   . Aneurysm Father   . Diabetes Brother   . Alcohol abuse Paternal Aunt    Social History:  Social History   Social History  . Marital Status: Married    Spouse Name: N/A  . Number of Children: N/A  . Years of Education: N/A   Social History Main Topics  . Smoking status: Current  Every Day Smoker -- 1.00 packs/day for 35 years    Types: Cigarettes    Start date: 02/08/1974  . Smokeless tobacco: Never Used  . Alcohol Use: No  . Drug Use: No  . Sexual Activity: Yes    Birth Control/ Protection: None   Other Topics Concern  . None   Social History Narrative   Additional History:  Married x 18 years. Has 2 children and Her husband has 2 children as well.she went to the cruise trip with her grandchildren.    Assessment:   Musculoskeletal: Strength & Muscle Tone: within normal limits Gait & Station: normal Patient leans: N/A  Psychiatric Specialty Exam: HPI   Review of Systems  Constitutional: Negative for chills and weight loss.  HENT: Negative for nosebleeds and tinnitus.   Eyes: Negative for double vision and discharge.  Respiratory: Positive for cough.   Cardiovascular: Negative for orthopnea.  Gastrointestinal: Positive for nausea and vomiting. Negative for diarrhea and blood in stool.  Genitourinary: Negative for urgency.  Musculoskeletal: Positive for back pain and joint pain.  Skin: Negative for rash.  Neurological: Negative for tingling, sensory change, focal weakness and headaches.  Endo/Heme/Allergies: Negative for environmental allergies.  Psychiatric/Behavioral: Positive for depression. The patient has insomnia.   All other systems reviewed and are negative.   Blood pressure 142/84, pulse 104,  temperature 98 F (36.7 C), temperature source Tympanic, height 5\' 6"  (1.676 m), weight 211 lb 6.4 oz (95.89 kg), SpO2 95 %.Body mass index is 34.14 kg/(m^2).  General Appearance: Casual  Eye Contact:  Fair  Speech:  Clear and Coherent  Volume:  Normal  Mood:  Depressed  Affect:  Depressed  Thought Process:  Coherent  Orientation:  Full (Time, Place, and Person)  Thought Content:  WDL  Suicidal Thoughts:  No  Homicidal Thoughts:  No  Memory:  Immediate;   Fair  Judgement:  Fair  Insight:  Fair  Psychomotor Activity:  Normal   Concentration:  Fair  Recall:  [employer] of Knowledge: Fair  Language: Fair  Akathisia:  No  Handed:  Right  AIMS (if indicated):  none  Assets:  Communication Skills Desire for Improvement Physical Health Social Support  ADL's:  Intact  Cognition: WNL  Sleep:  6-7   Is the patient at risk to self?  No. Has the patient been a risk to self in the past 6 months?  No. Has the patient been a risk to self within the distant past?  No. Is the patient a risk to others?  No. Has the patient been a risk to others in the past 6 months?  No. Has the patient been a risk to others within the distant past?  No.  Current Medications: Current Outpatient Prescriptions  Medication Sig Dispense Refill  . APIDRA SOLOSTAR 100 UNIT/ML Solostar Pen     . Apremilast 30 MG TABS Take 2 tablets by mouth daily.    Marland Kitchen atorvastatin (LIPITOR) 10 MG tablet Take 1 tablet by mouth daily.    . benzonatate (TESSALON) 100 MG capsule     . clonazePAM (KLONOPIN) 0.5 MG tablet Take 1 tablet (0.5 mg total) by mouth at bedtime. 30 tablet 0  . diclofenac (VOLTAREN) 75 MG EC tablet TAKE 1 TABLET BY MOUTH TWICE A DAY AS NEEDED 60 tablet 3  . fluticasone (FLONASE) 50 MCG/ACT nasal spray Place 2 sprays into the nose daily.    Marland Kitchen glyBURIDE-metformin (GLUCOVANCE) 5-500 MG per tablet Take 1 tablet by mouth 2 (two) times daily.    . Insulin Glargine 300 UNIT/ML SOPN Inject 60 Units into the skin daily.    Marland Kitchen ketoconazole (NIZORAL) 2 % cream     . loratadine (CLARITIN) 10 MG tablet Take 1 tablet by mouth daily.    Marland Kitchen losartan (COZAAR) 50 MG tablet TAKE 1 TABLET BY MOUTH EVERY DAY 30 tablet 6  . ONE TOUCH ULTRA TEST test strip     . ONETOUCH DELICA LANCETS FINE MISC     . PARoxetine (PAXIL) 40 MG tablet TAKE 1 TABLET BY MOUTH DAILY 30 tablet 0  . simvastatin (ZOCOR) 40 MG tablet Take 1 tablet by mouth daily.     No current facility-administered medications for this visit.    Medical Decision Making:  Established Problem,  Stable/Improving (1), Review of Psycho-Social Stressors (1) and Review of Last Therapy Session (1)  Treatment Plan Summary:Medication management   Depression and anxiety She is currently taking Paxil 40 mg by mouth daily Patient takes Klonopin on a when necessary basis  Follow-up Follow-up in 2 months or earlier depending on her symptoms   More than 50% of the time spent in psychoeducation, counseling and coordination of care.  Time spent with the patient 25 minutes   This note was generated in part or whole with voice recognition software. Voice regonition is usually quite accurate  but there are transcription errors that can and very often do occur. I apologize for any typographical errors that were not detected and corrected.   Rainey Pines, MD

## 2014-11-30 ENCOUNTER — Other Ambulatory Visit: Payer: Self-pay | Admitting: Family Medicine

## 2014-12-02 DIAGNOSIS — L400 Psoriasis vulgaris: Secondary | ICD-10-CM | POA: Diagnosis not present

## 2014-12-23 DIAGNOSIS — L400 Psoriasis vulgaris: Secondary | ICD-10-CM | POA: Diagnosis not present

## 2014-12-26 DIAGNOSIS — E1165 Type 2 diabetes mellitus with hyperglycemia: Secondary | ICD-10-CM | POA: Diagnosis not present

## 2014-12-26 DIAGNOSIS — E1129 Type 2 diabetes mellitus with other diabetic kidney complication: Secondary | ICD-10-CM | POA: Diagnosis not present

## 2014-12-30 DIAGNOSIS — E1129 Type 2 diabetes mellitus with other diabetic kidney complication: Secondary | ICD-10-CM | POA: Diagnosis not present

## 2014-12-30 DIAGNOSIS — E1165 Type 2 diabetes mellitus with hyperglycemia: Secondary | ICD-10-CM | POA: Diagnosis not present

## 2014-12-30 DIAGNOSIS — Z794 Long term (current) use of insulin: Secondary | ICD-10-CM | POA: Diagnosis not present

## 2014-12-30 DIAGNOSIS — N183 Chronic kidney disease, stage 3 (moderate): Secondary | ICD-10-CM | POA: Diagnosis not present

## 2014-12-30 DIAGNOSIS — E1122 Type 2 diabetes mellitus with diabetic chronic kidney disease: Secondary | ICD-10-CM | POA: Diagnosis not present

## 2014-12-30 DIAGNOSIS — Z716 Tobacco abuse counseling: Secondary | ICD-10-CM | POA: Diagnosis not present

## 2014-12-30 DIAGNOSIS — B9789 Other viral agents as the cause of diseases classified elsewhere: Secondary | ICD-10-CM | POA: Diagnosis not present

## 2014-12-30 DIAGNOSIS — R809 Proteinuria, unspecified: Secondary | ICD-10-CM | POA: Diagnosis not present

## 2014-12-30 DIAGNOSIS — J069 Acute upper respiratory infection, unspecified: Secondary | ICD-10-CM | POA: Diagnosis not present

## 2014-12-30 DIAGNOSIS — F17200 Nicotine dependence, unspecified, uncomplicated: Secondary | ICD-10-CM | POA: Diagnosis not present

## 2015-01-06 DIAGNOSIS — L409 Psoriasis, unspecified: Secondary | ICD-10-CM | POA: Diagnosis not present

## 2015-01-06 DIAGNOSIS — M25561 Pain in right knee: Secondary | ICD-10-CM | POA: Diagnosis not present

## 2015-01-06 DIAGNOSIS — M1990 Unspecified osteoarthritis, unspecified site: Secondary | ICD-10-CM | POA: Diagnosis not present

## 2015-01-06 DIAGNOSIS — G8929 Other chronic pain: Secondary | ICD-10-CM | POA: Diagnosis not present

## 2015-01-13 DIAGNOSIS — L400 Psoriasis vulgaris: Secondary | ICD-10-CM | POA: Diagnosis not present

## 2015-01-16 ENCOUNTER — Ambulatory Visit: Payer: Medicare Other | Admitting: Psychiatry

## 2015-01-17 ENCOUNTER — Other Ambulatory Visit: Payer: Self-pay

## 2015-01-17 NOTE — Telephone Encounter (Signed)
Refill requested from [facility] [address] requesting a 90 day supply of Atorvastatin 10 mg

## 2015-01-19 MED ORDER — ATORVASTATIN CALCIUM 10 MG PO TABS: 10.0000 mg | ORAL_TABLET | Freq: Every day | ORAL | Status: DC

## 2015-01-19 NOTE — Telephone Encounter (Signed)
Advise patient a 90 day refill will be sent to [facility] [address] and she is due for follow up appointment with lab tests in the next 4-6 weeks before another refill is needed.

## 2015-01-20 ENCOUNTER — Other Ambulatory Visit: Payer: Self-pay

## 2015-01-20 NOTE — Telephone Encounter (Signed)
Left voicemail on home and cell number advising patient that refill has been sent to pharmacy, and to call back to schedule a follow up appointment.

## 2015-01-21 ENCOUNTER — Encounter: Payer: Self-pay | Admitting: Family Medicine

## 2015-01-21 ENCOUNTER — Ambulatory Visit (INDEPENDENT_AMBULATORY_CARE_PROVIDER_SITE_OTHER): Payer: Medicare Other | Admitting: Psychiatry

## 2015-01-21 ENCOUNTER — Ambulatory Visit (INDEPENDENT_AMBULATORY_CARE_PROVIDER_SITE_OTHER): Payer: Medicare Other | Admitting: Family Medicine

## 2015-01-21 ENCOUNTER — Encounter: Payer: Self-pay | Admitting: Psychiatry

## 2015-01-21 VITALS — BP 142/102 | HR 72 | Temp 97.5°F | Resp 16 | Wt 208.2 lb

## 2015-01-21 VITALS — BP 138/88 | HR 94 | Temp 97.6°F | Ht 66.0 in | Wt 207.0 lb

## 2015-01-21 DIAGNOSIS — F331 Major depressive disorder, recurrent, moderate: Secondary | ICD-10-CM

## 2015-01-21 DIAGNOSIS — J0100 Acute maxillary sinusitis, unspecified: Secondary | ICD-10-CM

## 2015-01-21 MED ORDER — CLONAZEPAM 0.5 MG PO TABS: 0.5000 mg | ORAL_TABLET | Freq: Every day | ORAL | Status: DC

## 2015-01-21 MED ORDER — PAROXETINE HCL 40 MG PO TABS: 40.0000 mg | ORAL_TABLET | Freq: Every day | ORAL | Status: DC

## 2015-01-21 MED ORDER — AMOXICILLIN-POT CLAVULANATE 875-125 MG PO TABS: 1.0000 | ORAL_TABLET | Freq: Two times a day (BID) | ORAL | Status: DC

## 2015-01-21 NOTE — Progress Notes (Signed)
Patient ID: Jocelyn Wells, female   DOB: [birth year], 59 y.o.   MRN: [id number]   Patient: Jocelyn Wells Female    DOB: [birth year]   59 y.o.   MRN: [id number] Visit Date: 01/21/2015  Today's Provider: Vernie Murders, PA   Chief Complaint  Patient presents with  . Sinusitis   Subjective:    Sinusitis This is a new problem. The current episode started 1 to 4 weeks ago. The problem has been gradually worsening since onset. There has been no fever. Associated symptoms include chills, congestion, coughing, headaches, sinus pressure and sneezing. Pertinent negatives include no sore throat. (Post nasal drip.) Past treatments include oral decongestants (Mucinex as suggested by Dr. Gabriel Carina). The treatment provided mild relief.   Past Surgical History  Procedure Laterality Date  . Abdominal hysterectomy    . Tubal ligation  1978  . Cesarean section  1978  . Knee arthroscopy Left 2005  . Ulnar nerve transposition  2008  . Bilateral salpingoophorectomy  2000  . Ankle fracture surgery Left 2002   Patient Active Problem List   Diagnosis Date Noted  . Dizziness and giddiness 07/05/2014  . Blood pressure elevated 07/05/2014  . Carbuncle and furuncle 07/05/2014  . BP (high blood pressure) 07/05/2014  . Does not feel right 07/05/2014  . Pain of perianal area 07/05/2014  . Guttate psoriasis 07/05/2014  . Gonalgia 07/05/2014  . H/O diabetes mellitus 06/05/2014  . CAFL (chronic airflow limitation) ([place]) 06/05/2014  . H/O visual disturbance 06/05/2014  . H/O elevated lipids 06/05/2014  . H/O: HTN (hypertension) 06/05/2014  . H/O: osteoarthritis 06/05/2014  . Initial insomnia 06/05/2014  . Anxiety, generalized 06/05/2014  . Depression, major, recurrent, moderate ([place]) 06/05/2014  . Microalbuminuria 12/23/2013  . Long term current use of insulin ([place]) 12/23/2013  . Compulsive tobacco user syndrome 12/23/2013  . Contusion of cheek 03/31/2009  . Change in blood platelet count 06/08/2007  . Blood  in feces 06/01/2007  . D (diarrhea) 06/01/2007  . Disturbance of skin sensation 09/02/2006  . Difficulty hearing 07/25/2006  . Acute onset aura migraine 06/27/2006  . Cephalalgia 05/27/2006  . Clinical depression 10/22/2005  . Diabetes mellitus type 2, uncontrolled ([place]) 10/22/2005  . HLD (hyperlipidemia) 10/22/2005  . Arthritis, degenerative 10/22/2005  . Current tobacco use 10/22/2005   Family History  Problem Relation Age of Onset  . Heart disease Mother     died from CHF  . Asthma Mother   . Diabetes Mother   . Heart disease Father   . Aneurysm Father   . Diabetes Brother   . Alcohol abuse Paternal Aunt       Allergies  Allergen Reactions  . Codeine Nausea And Vomiting    Previous Medications   APIDRA SOLOSTAR 100 UNIT/ML SOLOSTAR PEN       APREMILAST 30 MG TABS    Take 2 tablets by mouth daily.   ATORVASTATIN (LIPITOR) 10 MG TABLET    Take 1 tablet (10 mg total) by mouth daily.   CLONAZEPAM (KLONOPIN) 0.5 MG TABLET    Take 1 tablet (0.5 mg total) by mouth at bedtime.   DICLOFENAC (VOLTAREN) 75 MG EC TABLET    TAKE 1 TABLET BY MOUTH TWICE A DAY AS NEEDED   FLUTICASONE (FLONASE) 50 MCG/ACT NASAL SPRAY    Place 2 sprays into the nose daily.   GLYBURIDE-METFORMIN (GLUCOVANCE) 5-500 MG PER TABLET    Take 1 tablet by mouth 2 (two) times daily.   INSULIN GLARGINE 300 UNIT/ML SOPN  Inject 60 Units into the skin daily.   KETOCONAZOLE (NIZORAL) 2 % CREAM       LORATADINE (CLARITIN) 10 MG TABLET    Take 1 tablet by mouth daily.   LOSARTAN (COZAAR) 50 MG TABLET    TAKE 1 TABLET BY MOUTH EVERY DAY   ONE TOUCH ULTRA TEST TEST STRIP       ONETOUCH DELICA LANCETS FINE MISC       PAROXETINE (PAXIL) 40 MG TABLET    Take 1 tablet (40 mg total) by mouth daily.   SIMVASTATIN (ZOCOR) 40 MG TABLET    Take 1 tablet by mouth daily.    Review of Systems  Constitutional: Positive for chills.  HENT: Positive for congestion, sinus pressure and sneezing. Negative for sore throat.   Eyes:  Negative.   Respiratory: Positive for cough.   Cardiovascular: Negative.   Gastrointestinal: Negative.   Endocrine: Negative.   Genitourinary: Negative.   Musculoskeletal: Negative.   Skin: Negative.   Allergic/Immunologic: Negative.   Neurological: Positive for headaches.  Hematological: Negative.   Psychiatric/Behavioral: Negative.     Social History  Substance Use Topics  . Smoking status: Current Every Day Smoker -- 1.00 packs/day for 35 years    Types: Cigarettes    Start date: 02/08/1974  . Smokeless tobacco: Never Used  . Alcohol Use: No   Objective:   There were no vitals taken for this visit.  Physical Exam  Constitutional: She is oriented to person, place, and time. She appears well-developed and well-nourished.  HENT:  Head: Normocephalic.  Right Ear: External ear normal.  Left Ear: External ear normal.  Mouth/Throat: Oropharynx is clear and moist.  Slightly red nasal mucus membranes with diminished transillumination and tenderness of maxillary sinuses.  Eyes: Conjunctivae and EOM are normal.  Neck: Normal range of motion. Neck supple.  Cardiovascular: Normal rate and regular rhythm.   Pulmonary/Chest: Effort normal and breath sounds normal.  Abdominal: Soft. Bowel sounds are normal.  Neurological: She is alert and oriented to person, place, and time.  Psychiatric: She has a normal mood and affect. Her behavior is normal.      Assessment & Plan:     1. Acute maxillary sinusitis, recurrence not specified Gradual onset over the past 2 weeks. May use Claritin, Mucinex-DM and saline nasal spray prn. Add Augmentin and recheck if no better in 5-7 days. - amoxicillin-clavulanate (AUGMENTIN) 875-125 MG tablet; Take 1 tablet by mouth 2 (two) times daily.  Dispense: 20 tablet; Refill: 0

## 2015-01-21 NOTE — Progress Notes (Signed)
[place] MD/PA/NP OP Progress Note  01/21/2015 4:08 PM Jocelyn Wells  MRN:  [id number]  Subjective:   Pt is  a 59 year old female who presented for follow-up appointment. She stated that she is feeling very tired and sick and she has been going to the sinus infection. She reported that she is coming from her primary care physician's office and he has started her on the antibiotics. Patient reported that she also fell last week at [employer] [place] when she went with her grandson. She injured her knee but she has recently received the cortisone injection in her knee and she reported that it helped. She has been taking the medications to help with her sinus infection. Patient reported that she feels very tired and she just wanted to go back home and lay in the bed. She reported that she has been compliant with her medications. She reported that she is planning to spend the Christmas with her family members. She currently denied having any worsening depression or anxiety symptoms. She denied having any suicidal ideations or plans.   She appeared anxious and apprehensive during the interview due to her sinus infection    Chief Complaint:  Chief Complaint    Follow-up; Medication Refill     Visit Diagnosis:     ICD-9-CM ICD-10-CM   1. MDD (major depressive disorder), recurrent episode, moderate ([place]) 296.32 F33.1     Past Medical History:  Past Medical History  Diagnosis Date  . Anxiety   . Depression   . Diabetes mellitus, type II [facility])     Past Surgical History  Procedure Laterality Date  . Abdominal hysterectomy    . Tubal ligation  1978  . Cesarean section  1978  . Knee arthroscopy Left 2005  . Ulnar nerve transposition  2008  . Bilateral salpingoophorectomy  2000  . Ankle fracture surgery Left 2002   Family History:  Family History  Problem Relation Age of Onset  . Heart disease Mother     died from CHF  . Asthma Mother   . Diabetes Mother   . Heart disease Father   . Aneurysm Father    . Diabetes Brother   . Alcohol abuse Paternal Aunt    Social History:  Social History   Social History  . Marital Status: Married    Spouse Name: N/A  . Number of Children: N/A  . Years of Education: N/A   Social History Main Topics  . Smoking status: Current Every Day Smoker -- 1.00 packs/day for 35 years    Types: Cigarettes    Start date: 02/08/1974  . Smokeless tobacco: Never Used  . Alcohol Use: No  . Drug Use: No  . Sexual Activity: Yes    Birth Control/ Protection: None   Other Topics Concern  . None   Social History Narrative   Additional History:  Married x 18 years. Has 2 children and Her husband has 2 children as well Assessment:   Musculoskeletal: Strength & Muscle Tone: within normal limits Gait & Station: normal Patient leans: N/A  Psychiatric Specialty Exam: HPI   Review of Systems  Constitutional: Negative for chills and weight loss.  HENT: Negative for nosebleeds and tinnitus.   Eyes: Negative for double vision and discharge.  Respiratory: Positive for cough.   Cardiovascular: Negative for orthopnea.  Gastrointestinal: Positive for nausea and vomiting. Negative for diarrhea and blood in stool.  Genitourinary: Negative for urgency.  Musculoskeletal: Positive for back pain and joint pain.  Skin: Negative for  rash.  Neurological: Negative for tingling, sensory change, focal weakness and headaches.  Endo/Heme/Allergies: Negative for environmental allergies.  Psychiatric/Behavioral: Positive for depression. The patient has insomnia.   All other systems reviewed and are negative.   Blood pressure 138/88, pulse 94, temperature 97.6 F (36.4 C), temperature source Tympanic, height 5\' 6"  (1.676 m), weight 207 lb (93.895 kg), SpO2 94 %.Body mass index is 33.43 kg/(m^2).  General Appearance: Casual  Eye Contact:  Fair  Speech:  Clear and Coherent  Volume:  Normal  Mood:  Depressed  Affect:  Depressed  Thought Process:  Coherent  Orientation:   Full (Time, Place, and Person)  Thought Content:  WDL  Suicidal Thoughts:  No  Homicidal Thoughts:  No  Memory:  Immediate;   Fair  Judgement:  Fair  Insight:  Fair  Psychomotor Activity:  Normal  Concentration:  Fair  Recall:  [employer] of Knowledge: Fair  Language: Fair  Akathisia:  No  Handed:  Right  AIMS (if indicated):  none  Assets:  Communication Skills Desire for Improvement Physical Health Social Support  ADL's:  Intact  Cognition: WNL  Sleep:  6-7   Is the patient at risk to self?  No. Has the patient been a risk to self in the past 6 months?  No. Has the patient been a risk to self within the distant past?  No. Is the patient a risk to others?  No. Has the patient been a risk to others in the past 6 months?  No. Has the patient been a risk to others within the distant past?  No.  Current Medications: Current Outpatient Prescriptions  Medication Sig Dispense Refill  . amoxicillin-clavulanate (AUGMENTIN) 875-125 MG tablet Take 1 tablet by mouth 2 (two) times daily. 20 tablet 0  . APIDRA SOLOSTAR 100 UNIT/ML Solostar Pen     . Apremilast 30 MG TABS Take 2 tablets by mouth daily.    Marland Kitchen atorvastatin (LIPITOR) 10 MG tablet Take 1 tablet (10 mg total) by mouth daily. 90 tablet 0  . clonazePAM (KLONOPIN) 0.5 MG tablet Take 1 tablet (0.5 mg total) by mouth at bedtime. 30 tablet 2  . diclofenac (VOLTAREN) 75 MG EC tablet TAKE 1 TABLET BY MOUTH TWICE A DAY AS NEEDED 60 tablet 3  . fluticasone (FLONASE) 50 MCG/ACT nasal spray Place 2 sprays into the nose daily.    Marland Kitchen glyBURIDE-metformin (GLUCOVANCE) 5-500 MG per tablet Take 1 tablet by mouth 2 (two) times daily.    . Insulin Glargine 300 UNIT/ML SOPN Inject 60 Units into the skin daily.    Marland Kitchen ketoconazole (NIZORAL) 2 % cream     . loratadine (CLARITIN) 10 MG tablet Take 1 tablet by mouth daily.    Marland Kitchen losartan (COZAAR) 50 MG tablet TAKE 1 TABLET BY MOUTH EVERY DAY 30 tablet 6  . ONE TOUCH ULTRA TEST test strip     . ONETOUCH  DELICA LANCETS FINE MISC     . PARoxetine (PAXIL) 40 MG tablet Take 1 tablet (40 mg total) by mouth daily. 30 tablet 3  . simvastatin (ZOCOR) 40 MG tablet Take 1 tablet by mouth daily.     No current facility-administered medications for this visit.    Medical Decision Making:  Established Problem, Stable/Improving (1), Review of Psycho-Social Stressors (1) and Review of Last Therapy Session (1)  Treatment Plan Summary:Medication management   Depression and anxiety She is currently taking Paxil 40 mg by mouth daily- 90 day supply given  Patient takes  Klonopin on a when necessary basis- 3 month supply given  Follow-up Follow-up in 3 months or earlier depending on her symptoms   More than 50% of the time spent in psychoeducation, counseling and coordination of care.     This note was generated in part or whole with voice recognition software. Voice regonition is usually quite accurate but there are transcription errors that can and very often do occur. I apologize for any typographical errors that were not detected and corrected.   Rainey Pines, MD

## 2015-01-21 NOTE — Patient Instructions (Signed)

## 2015-01-23 ENCOUNTER — Other Ambulatory Visit: Payer: Self-pay | Admitting: Family Medicine

## 2015-01-29 ENCOUNTER — Emergency Department (HOSPITAL_COMMUNITY): Payer: Medicare Other

## 2015-01-29 ENCOUNTER — Inpatient Hospital Stay (HOSPITAL_COMMUNITY)
Admission: EM | Admit: 2015-01-29 | Discharge: 2015-01-31 | DRG: 065 | Disposition: A | Discharge status: Home / Self Care | Payer: Medicare Other | Attending: Internal Medicine | Admitting: Internal Medicine

## 2015-01-29 ENCOUNTER — Observation Stay (HOSPITAL_COMMUNITY): Payer: Medicare Other

## 2015-01-29 ENCOUNTER — Encounter (HOSPITAL_COMMUNITY): Payer: Self-pay | Admitting: Vascular Surgery

## 2015-01-29 DIAGNOSIS — M4802 Spinal stenosis, cervical region: Secondary | ICD-10-CM | POA: Diagnosis not present

## 2015-01-29 DIAGNOSIS — N179 Acute kidney failure, unspecified: Secondary | ICD-10-CM | POA: Diagnosis not present

## 2015-01-29 DIAGNOSIS — F419 Anxiety disorder, unspecified: Secondary | ICD-10-CM | POA: Diagnosis present

## 2015-01-29 DIAGNOSIS — Z8639 Personal history of other endocrine, nutritional and metabolic disease: Secondary | ICD-10-CM

## 2015-01-29 DIAGNOSIS — R1114 Bilious vomiting: Secondary | ICD-10-CM

## 2015-01-29 DIAGNOSIS — F17210 Nicotine dependence, cigarettes, uncomplicated: Secondary | ICD-10-CM | POA: Diagnosis present

## 2015-01-29 DIAGNOSIS — Z794 Long term (current) use of insulin: Secondary | ICD-10-CM

## 2015-01-29 DIAGNOSIS — Z79899 Other long term (current) drug therapy: Secondary | ICD-10-CM

## 2015-01-29 DIAGNOSIS — I63512 Cerebral infarction due to unspecified occlusion or stenosis of left middle cerebral artery: Secondary | ICD-10-CM

## 2015-01-29 DIAGNOSIS — I429 Cardiomyopathy, unspecified: Secondary | ICD-10-CM | POA: Diagnosis not present

## 2015-01-29 DIAGNOSIS — J329 Chronic sinusitis, unspecified: Secondary | ICD-10-CM | POA: Diagnosis present

## 2015-01-29 DIAGNOSIS — Z7982 Long term (current) use of aspirin: Secondary | ICD-10-CM

## 2015-01-29 DIAGNOSIS — R112 Nausea with vomiting, unspecified: Secondary | ICD-10-CM

## 2015-01-29 DIAGNOSIS — I10 Essential (primary) hypertension: Secondary | ICD-10-CM | POA: Diagnosis present

## 2015-01-29 DIAGNOSIS — R200 Anesthesia of skin: Secondary | ICD-10-CM | POA: Diagnosis not present

## 2015-01-29 DIAGNOSIS — N184 Chronic kidney disease, stage 4 (severe): Secondary | ICD-10-CM

## 2015-01-29 DIAGNOSIS — R197 Diarrhea, unspecified: Secondary | ICD-10-CM | POA: Diagnosis present

## 2015-01-29 DIAGNOSIS — G8191 Hemiplegia, unspecified affecting right dominant side: Secondary | ICD-10-CM | POA: Diagnosis present

## 2015-01-29 DIAGNOSIS — F411 Generalized anxiety disorder: Secondary | ICD-10-CM | POA: Diagnosis present

## 2015-01-29 DIAGNOSIS — E1165 Type 2 diabetes mellitus with hyperglycemia: Secondary | ICD-10-CM | POA: Diagnosis not present

## 2015-01-29 DIAGNOSIS — R4701 Aphasia: Secondary | ICD-10-CM | POA: Diagnosis not present

## 2015-01-29 DIAGNOSIS — R4781 Slurred speech: Secondary | ICD-10-CM | POA: Diagnosis not present

## 2015-01-29 DIAGNOSIS — IMO0002 Reserved for concepts with insufficient information to code with codable children: Secondary | ICD-10-CM | POA: Diagnosis present

## 2015-01-29 DIAGNOSIS — F329 Major depressive disorder, single episode, unspecified: Secondary | ICD-10-CM | POA: Diagnosis present

## 2015-01-29 DIAGNOSIS — I081 Rheumatic disorders of both mitral and tricuspid valves: Secondary | ICD-10-CM | POA: Diagnosis present

## 2015-01-29 DIAGNOSIS — Z8679 Personal history of other diseases of the circulatory system: Secondary | ICD-10-CM

## 2015-01-29 DIAGNOSIS — I6622 Occlusion and stenosis of left posterior cerebral artery: Secondary | ICD-10-CM | POA: Diagnosis not present

## 2015-01-29 DIAGNOSIS — I639 Cerebral infarction, unspecified: Secondary | ICD-10-CM | POA: Diagnosis not present

## 2015-01-29 DIAGNOSIS — E785 Hyperlipidemia, unspecified: Secondary | ICD-10-CM | POA: Diagnosis present

## 2015-01-29 HISTORY — DX: Chronic kidney disease, stage 4 (severe): N18.4

## 2015-01-29 HISTORY — DX: Nausea with vomiting, unspecified: R11.2

## 2015-01-29 HISTORY — DX: Other specified postprocedural states: Z98.890

## 2015-01-29 HISTORY — DX: Acute kidney failure, unspecified: N17.9

## 2015-01-29 HISTORY — DX: Cerebral infarction due to unspecified occlusion or stenosis of left middle cerebral artery: I63.512

## 2015-01-29 LAB — CBC
HCT: 37.3 % (ref 36.0–46.0)
Hemoglobin: 11.9 g/dL — ABNORMAL LOW (ref 12.0–15.0)
MCH: 27.9 pg (ref 26.0–34.0)
MCHC: 31.9 g/dL (ref 30.0–36.0)
MCV: 87.4 fL (ref 78.0–100.0)
PLATELETS: 191 10*3/uL (ref 150–400)
RBC: 4.27 MIL/uL (ref 3.87–5.11)
RDW: 13.7 % (ref 11.5–15.5)
WBC: 10.6 10*3/uL — AB (ref 4.0–10.5)

## 2015-01-29 LAB — GLUCOSE, CAPILLARY: GLUCOSE-CAPILLARY: 111 mg/dL — AB (ref 65–99)

## 2015-01-29 LAB — APTT: APTT: 27 s (ref 24–37)

## 2015-01-29 LAB — COMPREHENSIVE METABOLIC PANEL
ALBUMIN: 3.5 g/dL (ref 3.5–5.0)
ALT: 17 U/L (ref 14–54)
AST: 17 U/L (ref 15–41)
Alkaline Phosphatase: 82 U/L (ref 38–126)
Anion gap: 10 (ref 5–15)
BUN: 22 mg/dL — AB (ref 6–20)
CHLORIDE: 105 mmol/L (ref 101–111)
CO2: 24 mmol/L (ref 22–32)
Calcium: 9.2 mg/dL (ref 8.9–10.3)
Creatinine, Ser: 1.33 mg/dL — ABNORMAL HIGH (ref 0.44–1.00)
GFR calc Af Amer: 49 mL/min — ABNORMAL LOW (ref >60)
GFR, EST NON AFRICAN AMERICAN: 42 mL/min — AB (ref >60)
GLUCOSE: 211 mg/dL — AB (ref 65–99)
POTASSIUM: 4.8 mmol/L (ref 3.5–5.1)
SODIUM: 139 mmol/L (ref 135–145)
Total Bilirubin: 0.5 mg/dL (ref 0.3–1.2)
Total Protein: 6.2 g/dL — ABNORMAL LOW (ref 6.5–8.1)

## 2015-01-29 LAB — I-STAT CHEM 8, ED
BUN: 28 mg/dL — ABNORMAL HIGH (ref 6–20)
CHLORIDE: 105 mmol/L (ref 101–111)
CREATININE: 1.1 mg/dL — AB (ref 0.44–1.00)
Calcium, Ion: 1.08 mmol/L — ABNORMAL LOW (ref 1.13–1.30)
Glucose, Bld: 205 mg/dL — ABNORMAL HIGH (ref 65–99)
HEMATOCRIT: 37 % (ref 36.0–46.0)
HEMOGLOBIN: 12.6 g/dL (ref 12.0–15.0)
POTASSIUM: 4.8 mmol/L (ref 3.5–5.1)
Sodium: 139 mmol/L (ref 135–145)
TCO2: 23 mmol/L (ref 0–100)

## 2015-01-29 LAB — I-STAT TROPONIN, ED: TROPONIN I, POC: 0.01 ng/mL (ref 0.00–0.08)

## 2015-01-29 LAB — PROTIME-INR
INR: 1.05 (ref 0.00–1.49)
Prothrombin Time: 13.9 seconds (ref 11.6–15.2)

## 2015-01-29 LAB — DIFFERENTIAL
BASOS ABS: 0 10*3/uL (ref 0.0–0.1)
BASOS PCT: 0 %
EOS ABS: 0.1 10*3/uL (ref 0.0–0.7)
Eosinophils Relative: 1 %
Lymphocytes Relative: 24 %
Lymphs Abs: 2.6 10*3/uL (ref 0.7–4.0)
Monocytes Absolute: 0.5 10*3/uL (ref 0.1–1.0)
Monocytes Relative: 4 %
NEUTROS ABS: 7.4 10*3/uL (ref 1.7–7.7)
NEUTROS PCT: 71 %

## 2015-01-29 MED ORDER — CLONAZEPAM 0.5 MG PO TABS
0.5000 mg | ORAL_TABLET | Freq: Every day | ORAL | Status: DC
  Administered 2015-01-29 – 2015-01-30 (×2): 0.5 mg via ORAL
  Filled 2015-01-29 (×2): qty 1

## 2015-01-29 MED ORDER — ONDANSETRON HCL 4 MG/2ML IJ SOLN: 4.0000 mg | Freq: Three times a day (TID) | INTRAMUSCULAR | Status: AC | PRN

## 2015-01-29 MED ORDER — SODIUM CHLORIDE 0.9 % IV SOLN
INTRAVENOUS | Status: DC
  Administered 2015-01-29: 18:00:00 via INTRAVENOUS

## 2015-01-29 MED ORDER — ATORVASTATIN CALCIUM 10 MG PO TABS
10.0000 mg | ORAL_TABLET | Freq: Every day | ORAL | Status: DC
  Administered 2015-01-30 – 2015-01-31 (×2): 10 mg via ORAL
  Filled 2015-01-29 (×2): qty 1

## 2015-01-29 MED ORDER — PAROXETINE HCL 20 MG PO TABS
40.0000 mg | ORAL_TABLET | Freq: Every day | ORAL | Status: DC
  Administered 2015-01-29 – 2015-01-31 (×3): 40 mg via ORAL
  Filled 2015-01-29 (×3): qty 2

## 2015-01-29 MED ORDER — FLUTICASONE PROPIONATE 50 MCG/ACT NA SUSP
2.0000 | Freq: Every day | NASAL | Status: DC
Start: 2015-01-29 — End: 2015-01-29

## 2015-01-29 MED ORDER — ASPIRIN EC 325 MG PO TBEC
325.0000 mg | DELAYED_RELEASE_TABLET | ORAL | Status: AC
  Administered 2015-01-29: 325 mg via ORAL
  Filled 2015-01-29: qty 1

## 2015-01-29 MED ORDER — ENOXAPARIN SODIUM 40 MG/0.4ML ~~LOC~~ SOLN
40.0000 mg | SUBCUTANEOUS | Status: DC
  Administered 2015-01-29 – 2015-01-30 (×2): 40 mg via SUBCUTANEOUS
  Filled 2015-01-29 (×2): qty 0.4

## 2015-01-29 MED ORDER — STROKE: EARLY STAGES OF RECOVERY BOOK
Freq: Once | Status: AC
  Administered 2015-01-29: 23:00:00

## 2015-01-29 MED ORDER — IOHEXOL 350 MG/ML SOLN
80.0000 mL | Freq: Once | INTRAVENOUS | Status: AC | PRN
  Administered 2015-01-29: 80 mL via INTRAVENOUS

## 2015-01-29 MED ORDER — SIMVASTATIN 40 MG PO TABS: 40.0000 mg | ORAL_TABLET | Freq: Every day | ORAL | Status: DC

## 2015-01-29 MED ORDER — ONDANSETRON HCL 4 MG/2ML IJ SOLN
4.0000 mg | Freq: Once | INTRAMUSCULAR | Status: AC
  Administered 2015-01-29: 4 mg via INTRAVENOUS

## 2015-01-29 MED ORDER — INSULIN ASPART 100 UNIT/ML ~~LOC~~ SOLN
0.0000 [IU] | Freq: Three times a day (TID) | SUBCUTANEOUS | Status: DC
  Administered 2015-01-30: 1 [IU] via SUBCUTANEOUS
  Administered 2015-01-30 – 2015-01-31 (×2): 3 [IU] via SUBCUTANEOUS
  Administered 2015-01-31: 5 [IU] via SUBCUTANEOUS
  Administered 2015-01-31: 2 [IU] via SUBCUTANEOUS

## 2015-01-29 MED ORDER — ONDANSETRON HCL 4 MG/2ML IJ SOLN
INTRAMUSCULAR | Status: AC
  Filled 2015-01-29: qty 2

## 2015-01-29 MED ORDER — SODIUM CHLORIDE 0.9 % IV BOLUS (SEPSIS)
1000.0000 mL | INTRAVENOUS | Status: AC
  Administered 2015-01-29: 1000 mL via INTRAVENOUS

## 2015-01-29 MED ORDER — SENNOSIDES-DOCUSATE SODIUM 8.6-50 MG PO TABS: 1.0000 | ORAL_TABLET | Freq: Every evening | ORAL | Status: DC | PRN

## 2015-01-29 NOTE — H&P (Signed)
[facility] History and Physical  Jocelyn Wells [id number] DOB: 10/27/1955 DOA: 01/29/2015  Referring physician: Dr Audie Pinto PCP: Vernie Murders, PA   Chief Complaint: right side weakness, slurred speech.   HPI: Jocelyn Wells is a 59 y.o. female with PMH significant for Depression, Diabetes, HTN, MDD, who presents with sudden onset of right hand weakness, right side Numbness, and mild world finding difficulty, symptoms started at 2 PM. She presents to ED as CODE stroke. She was evaluated by neurology. TPA was not administered due to mild symptoms. She relates that her symptoms are stable, symptoms are not worse. She was able to repeat multiples words. She continue to have some difficulty finding words and completing sentences. She report mild loose stool. She is on antibiotics  For sinusitis. She denies abdominal pain. She vomited twice in the ED, food content.    Evaluation in the ED: CT head negative, Cr at 1.3, troponin 0.01, CBG 205.    Review of Systems:  Negative, except as per HPI  Past Medical History  Diagnosis Date  . Anxiety   . Depression   . Diabetes mellitus, type II [facility])    Past Surgical History  Procedure Laterality Date  . Abdominal hysterectomy    . Tubal ligation  1978  . Cesarean section  1978  . Knee arthroscopy Left 2005  . Ulnar nerve transposition  2008  . Bilateral salpingoophorectomy  2000  . Ankle fracture surgery Left 2002   Social History:  reports that she has been smoking Cigarettes.  She started smoking about 41 years ago. She has a 35 pack-year smoking history. She has never used smokeless tobacco. She reports that she does not drink alcohol or use illicit drugs.  Allergies  Allergen Reactions  . Codeine Nausea And Vomiting    Family History  Problem Relation Age of Onset  . Heart disease Mother     died from CHF  . Asthma Mother   . Diabetes Mother   . Heart disease Father   . Aneurysm Father   . Diabetes Brother   .  Alcohol abuse Paternal Aunt     Prior to Admission medications   Medication Sig Start Date End Date Taking? Authorizing Provider  amoxicillin-clavulanate (AUGMENTIN) 875-125 MG tablet Take 1 tablet by mouth 2 (two) times daily. 01/21/15  Yes Dennis E Chrismon, PA  Apremilast (OTEZLA) 30 MG TABS Take 30 mg by mouth 2 (two) times daily.   Yes Historical Provider, MD  atorvastatin (LIPITOR) 10 MG tablet Take 1 tablet (10 mg total) by mouth daily. 01/19/15  Yes Dennis E Chrismon, PA  benzonatate (TESSALON) 100 MG capsule TAKE ONE CAPSULE BY MOUTH EVERY NIGHT AT BEDTIME 01/23/15  Yes Dennis E Chrismon, PA  clonazePAM (KLONOPIN) 0.5 MG tablet Take 1 tablet (0.5 mg total) by mouth at bedtime. 01/21/15  Yes Rainey Pines, MD  diclofenac (VOLTAREN) 75 MG EC tablet TAKE 1 TABLET BY MOUTH TWICE A DAY AS NEEDED 10/08/14  Yes Dennis E Chrismon, PA  glyBURIDE-metformin (GLUCOVANCE) 5-500 MG per tablet Take 1 tablet by mouth 2 (two) times daily.   Yes Historical Provider, MD  loratadine (CLARITIN) 10 MG tablet Take 1 tablet by mouth daily.   Yes Historical Provider, MD  losartan (COZAAR) 50 MG tablet TAKE 1 TABLET BY MOUTH EVERY DAY 10/21/14  Yes Vickki Muff Chrismon, PA  ONE TOUCH ULTRA TEST test strip  06/19/14  Yes Historical Provider, MD  Jonetta Speak LANCETS FINE [place]  06/19/14  Yes Historical  Provider, MD  PARoxetine (PAXIL) 40 MG tablet Take 1 tablet (40 mg total) by mouth daily. 01/21/15  Yes Rainey Pines, MD  zolpidem (AMBIEN) 5 MG tablet Take 5 mg by mouth at bedtime.   Yes Historical Provider, MD  APIDRA SOLOSTAR 100 UNIT/ML Solostar Pen  09/15/14   Historical Provider, MD  Apremilast 30 MG TABS Take 2 tablets by mouth daily.    Historical Provider, MD  fluticasone (FLONASE) 50 MCG/ACT nasal spray Place 2 sprays into the nose daily. 06/27/14   Historical Provider, MD  Insulin Glargine 300 UNIT/ML SOPN Inject 60 Units into the skin daily.    Historical Provider, MD  ketoconazole (NIZORAL) 2 % cream  12/17/13    Historical Provider, MD  simvastatin (ZOCOR) 40 MG tablet Take 1 tablet by mouth daily.    Historical Provider, MD   Physical Exam: Filed Vitals:   01/29/15 1633 01/29/15 1700 01/29/15 1724 01/29/15 1730  BP:  115/91 154/66 141/91  Pulse:  95 88 86  Resp:  17 23 21   SpO2: 96% 100% 93% 95%    Wt Readings from Last 3 Encounters:  01/21/15 93.895 kg (207 lb)  01/21/15 94.439 kg (208 lb 3.2 oz)  11/19/14 95.89 kg (211 lb 6.4 oz)    General:  Appears calm and comfortable Eyes: PERRL, normal lids, irises & conjunctiva ENT: grossly normal hearing, lips & tongue Neck: no LAD, masses or thyromegaly Cardiovascular: RRR, no m/r/g. No LE edema. Telemetry: SR, no arrhythmias  Respiratory: CTA bilaterally, no w/r/r. Normal respiratory effort. Abdomen: soft, ntnd Skin: no rash or induration seen on limited exam Musculoskeletal: grossly normal tone BUE/BLE Neurologic: speech mildly dysarthric, mild word finding difficulty. Motor strength 5/5.           Labs on Admission:  Basic Metabolic Panel:  Recent Labs Lab 01/29/15 1635 01/29/15 1641  NA 139 139  K 4.8 4.8  CL 105 105  CO2 24  --   GLUCOSE 211* 205*  BUN 22* 28*  CREATININE 1.33* 1.10*  CALCIUM 9.2  --    Liver Function Tests:  Recent Labs Lab 01/29/15 1635  AST 17  ALT 17  ALKPHOS 82  BILITOT 0.5  PROT 6.2*  ALBUMIN 3.5   No results for input(s): LIPASE, AMYLASE in the last 168 hours. No results for input(s): AMMONIA in the last 168 hours. CBC:  Recent Labs Lab 01/29/15 1635 01/29/15 1641  WBC 10.6*  --   NEUTROABS 7.4  --   HGB 11.9* 12.6  HCT 37.3 37.0  MCV 87.4  --   PLT 191  --    Cardiac Enzymes: No results for input(s): CKTOTAL, CKMB, CKMBINDEX, TROPONINI in the last 168 hours.  BNP (last 3 results) No results for input(s): BNP in the last 8760 hours.  ProBNP (last 3 results) No results for input(s): PROBNP in the last 8760 hours.  CBG: No results for input(s): GLUCAP in the last 168  hours.  Radiological Exams on Admission: Ct Head Wo Contrast  01/29/2015  ADDENDUM REPORT: 01/29/2015 16:59 ADDENDUM: These results were called by telephone at the time of interpretation on 01/29/2015 at 4:55 pm to Dr. Leonel Ramsay, who verbally acknowledged these results. Electronically Signed   By: Nelson Chimes M.D.   On: 01/29/2015 16:59  01/29/2015  CLINICAL DATA:  Code stroke. Slurred speech. Numbness of the right fingers. Last seen normal 3 hours ago. EXAM: CT HEAD WITHOUT CONTRAST TECHNIQUE: Contiguous axial images were obtained from the base of the skull through  the vertex without intravenous contrast. COMPARISON:  None. FINDINGS: The brain has a normal appearance without evidence of malformation, atrophy, old or acute infarction, mass lesion, hemorrhage, hydrocephalus or extra-axial collection. The calvarium is unremarkable. The paranasal sinuses, middle ears and mastoids are clear. IMPRESSION: Normal head CT I am in the process of calling this report at this time. Electronically Signed: By: Nelson Chimes M.D. On: 01/29/2015 16:56    EKG: Independently reviewed. RBBB, anterior fascicular block  Assessment/Plan Active Problems:   H/O diabetes mellitus   H/O: HTN (hypertension)   Anxiety, generalized   Diabetes mellitus type 2, uncontrolled ([facility])   Nausea & vomiting   AKI (acute kidney injury) ([place])   1-Right side numbness, hand weakness, slurred speech: Probably stroke, TIA>  Patient presents with neuro deficit. Evaluated by neurology concern for stroke.  No TPA administered, due to mild symptoms.  Permissive HTN.  Stroke work up, MRI,  ECHO carotid doppler. CTA pending.   2-Diabetes; Hold oral hypoglycemic agents.  SSI. Hold toujeo.   3-Nausea, Vomiting mild Diarrhea.  IV fluids, PRN Zofran.  Hold Augmentin, Check C diff.   4-HTN; Permissive HTN in setting of possible stroke.  Hold anti hypertensive medications.   5-AKI; Mildly elevated cr at 1.3. Prior records cr at 1.0  ten months ago.  IV fluids. Hold Cozaar, Voltaren.   6-Sinusitis;  Hold Augmentin.      Code Status: suspect Full Code.  DVT Prophylaxis: lovenox.  Family Communication: Care discussed with patient and family at bedside.  Disposition Plan: Expect 2 to 3 days depending on result;ts.   Time spent: 75 minutes.   Niel Hummer A [facility] Pager [phone]

## 2015-01-29 NOTE — ED Notes (Signed)
Pt had several episodes of emesis. Large amount of food noted in emesis.

## 2015-01-29 NOTE — Progress Notes (Signed)
Arrived from Ed. Alert and oriented. Denies any pain. Oriented to room. Call light within reach. Will continue to monitor.

## 2015-01-29 NOTE — ED Notes (Signed)
[facility] notified.   

## 2015-01-29 NOTE — Code Documentation (Signed)
59 year old female presents to ED via [facility] with difficulty word finding,slurred speech.  Patient states she was out shopping and returned home around 2pm.  She had sudden onset of numbness and heaviness of right arm and noticed that her speech was slurred.  She arrived at the ED at 1638 via EMS.  EDP saw patient and code stroke was called at 1638.  See flow sheet for further times.  Her NIHHS is 3 - slurred speech, mild word finding and right arm, leg and face decreased sensation.  She feels like she is getting better.  Dr. Leonel Ramsay at bedside.  Discussed treatment options.  Patient became nauseated - large amount emesis with food particles.  Denies headache or chest pain. Zofran 4 mg IV given by [place].  She is on antibiotics for a sinus infection per her report.  Her blood sugar is 205.  Remains in tPA window until 1830 - handoff to [place].  Patient taken to CT scan for CTA head and neck.  Family updated by Dr. Leonel Ramsay.

## 2015-01-29 NOTE — Consult Note (Signed)
Neurology Consultation Reason for Consult: Stroke Referring Physician: Audie Pinto, R  CC: Stroke  History is obtained from:patient  HPI: Jocelyn Wells is a 59 y.o. female with a history of DM, HTN who presents with sudden onset right hand weakness, right sided numbness and mild word finding difficulty that started at 2pm. She states that she noticed that she was slurring her speech. She was brought into the emergency room where a code stroke was activated.  Extensive discussion was had with the patient and her son. With an NIH of 3, it is not typical to offer TPA and therefore I discussed this with the son and patient who agreed holding off on TPA at this time.   LKW: 2pm tpa given?: no, mild symptoms    ROS: A 14 point ROS was performed and is negative except as noted in the HPI.   Past Medical History  Diagnosis Date  . Anxiety   . Depression   . Diabetes mellitus, type II ([place])      Family History  Problem Relation Age of Onset  . Heart disease Mother     died from CHF  . Asthma Mother   . Diabetes Mother   . Heart disease Father   . Aneurysm Father   . Diabetes Brother   . Alcohol abuse Paternal Aunt      Social History:  reports that she has been smoking Cigarettes.  She started smoking about 41 years ago. She has a 35 pack-year smoking history. She has never used smokeless tobacco. She reports that she does not drink alcohol or use illicit drugs.   Exam: Current vital signs: BP 154/66 mmHg  Pulse 88  Resp 23  SpO2 93% Vital signs in last 24 hours: Pulse Rate:  [88-95] 88 (12/21 1724) Resp:  [17-23] 23 (12/21 1724) BP: (115-154)/(66-91) 154/66 mmHg (12/21 1724) SpO2:  [93 %-100 %] 93 % (12/21 1724)  Physical Exam  Constitutional: Appears well-developed and well-nourished.  Psych: Affect appropriate to situation Eyes: No scleral injection HENT: No OP obstrucion Head: Normocephalic.  Cardiovascular: Normal rate and regular rhythm.  Respiratory: Effort  normal and breath sounds normal to anterior ascultation GI: Soft.  No distension. There is no tenderness.  Skin: WDI  Neuro: Mental Status: Patient is awake, alert, oriented to person, place, month,  Patient is able to give a clear and coherent history. No signs of neglect, she does have mild coated with word finding but is able to repeat, name common objects, and read. She is mildly dysarthric Cranial Nerves: II: Visual Fields are full. Pupils are equal, round, and reactive to light.   III,IV, VI: EOMI without ptosis or diploplia.  V: Facial sensation is symmetric to temperature VII: Facial movement is symmetric VIII: hearing is intact to voice X: Uvula elevates symmetrically XI: Shoulder shrug is symmetric. XII: tongue is midline without atrophy or fasciculations.  Motor: Tone is normal. Bulk is normal. 5/5 strength was present in all four extremities. She does not have drift, however she does have mild hand weakness on the right. Sensory: Sensation is diminished throughout the right side Cerebellar: FNF  intact bilaterally    I have reviewed labs in epic and the results pertinent to this consultation are: CMP-relatively unremarkable  I have reviewed the images obtained: CT head-negative  Impression: 59 year old female with what I suspect be small right MCA distribution stroke. Her symptoms are mild, and therefore have not proceed IV TPA. She will need to be admitted for secondary prevention  and further evaluation.  Recommendations: 1. HgbA1c, fasting lipid panel 2. MRI of the brain without contrast 3. Frequent neuro checks 4. Echocardiogram 5. CT angiogram of the head and neck 6. Prophylactic therapy-Antiplatelet med: Aspirin - dose 325mg  PO or 300mg  PR 7. Risk factor modification 8. Telemetry monitoring 9. PT consult, OT consult, Speech consult    Roland Rack, MD [facility] [phone]  If 7pm- 7am, please page neurology on call as listed  in [place].

## 2015-01-29 NOTE — ED Notes (Signed)
Pt reports to the ED via [facility]. Pt developed sudden onset of generalized weakness and slurred speech at 1400. 12 lead en route showed RBBB. Pt denies any HA, CP, or SOB. Symptom onset witnessed by family. EDP at bedside called code stroke. Pt A&Ox4, resp e/u, and skin warm and dry.

## 2015-01-29 NOTE — ED Provider Notes (Signed)
CSN: [id number]     Arrival date & time 01/29/15  1633 History   First MD Initiated Contact with Patient 01/29/15 1640     Chief Complaint  Patient presents with  . Code Stroke     (Consider location/radiation/quality/duration/timing/severity/associated sxs/prior Treatment) Patient is a 59 y.o. female presenting with neurologic complaint. The history is provided by the patient.  Neurologic Problem This is a new problem. The current episode started today. The problem occurs constantly. The problem has been unchanged. Associated symptoms include numbness. Pertinent negatives include no abdominal pain, anorexia, arthralgias, change in bowel habit, chest pain, chills, congestion, coughing, diaphoresis, fatigue, fever, headaches, joint swelling, myalgias, nausea, neck pain, rash, sore throat, swollen glands, urinary symptoms, vertigo, visual change, vomiting or weakness. Nothing aggravates the symptoms. She has tried nothing for the symptoms. The treatment provided no relief.    Past Medical History  Diagnosis Date  . Anxiety   . Depression   . Diabetes mellitus, type II [facility])    Past Surgical History  Procedure Laterality Date  . Abdominal hysterectomy    . Tubal ligation  1978  . Cesarean section  1978  . Knee arthroscopy Left 2005  . Ulnar nerve transposition  2008  . Bilateral salpingoophorectomy  2000  . Ankle fracture surgery Left 2002   Family History  Problem Relation Age of Onset  . Heart disease Mother     died from CHF  . Asthma Mother   . Diabetes Mother   . Heart disease Father   . Aneurysm Father   . Diabetes Brother   . Alcohol abuse Paternal Aunt    Social History  Substance Use Topics  . Smoking status: Current Every Day Smoker -- 1.00 packs/day for 35 years    Types: Cigarettes    Start date: 02/08/1974  . Smokeless tobacco: Never Used  . Alcohol Use: No   OB History    No data available     Review of Systems  Constitutional: Negative for fever,  chills, diaphoresis and fatigue.  HENT: Positive for voice change. Negative for congestion, drooling, facial swelling, hearing loss, sore throat and trouble swallowing.   Eyes: Negative for pain and visual disturbance.  Respiratory: Negative for cough.   Cardiovascular: Negative for chest pain.  Gastrointestinal: Negative for nausea, vomiting, abdominal pain, anorexia and change in bowel habit.  Genitourinary: Negative for dysuria.  Musculoskeletal: Negative for myalgias, joint swelling, arthralgias and neck pain.  Skin: Negative for rash.  Neurological: Positive for speech difficulty and numbness. Negative for dizziness, vertigo, tremors, seizures, syncope, facial asymmetry, weakness, light-headedness and headaches.  Psychiatric/Behavioral: Negative for confusion.      Allergies  Codeine  Home Medications   Prior to Admission medications   Medication Sig Start Date End Date Taking? Authorizing Provider  amoxicillin-clavulanate (AUGMENTIN) 875-125 MG tablet Take 1 tablet by mouth 2 (two) times daily. 01/21/15   Vickki Muff Chrismon, PA  APIDRA SOLOSTAR 100 UNIT/ML Solostar Pen  09/15/14   Historical Provider, MD  Apremilast 30 MG TABS Take 2 tablets by mouth daily.    Historical Provider, MD  atorvastatin (LIPITOR) 10 MG tablet Take 1 tablet (10 mg total) by mouth daily. 01/19/15   Vickki Muff Chrismon, PA  benzonatate (TESSALON) 100 MG capsule TAKE ONE CAPSULE BY MOUTH EVERY NIGHT AT BEDTIME 01/23/15   Vickki Muff Chrismon, PA  clonazePAM (KLONOPIN) 0.5 MG tablet Take 1 tablet (0.5 mg total) by mouth at bedtime. 01/21/15   Rainey Pines, MD  diclofenac (VOLTAREN)  75 MG EC tablet TAKE 1 TABLET BY MOUTH TWICE A DAY AS NEEDED 10/08/14   Vickki Muff Chrismon, PA  fluticasone (FLONASE) 50 MCG/ACT nasal spray Place 2 sprays into the nose daily. 06/27/14   Historical Provider, MD  glyBURIDE-metformin (GLUCOVANCE) 5-500 MG per tablet Take 1 tablet by mouth 2 (two) times daily.    Historical Provider, MD   Insulin Glargine 300 UNIT/ML SOPN Inject 60 Units into the skin daily.    Historical Provider, MD  ketoconazole (NIZORAL) 2 % cream  12/17/13   Historical Provider, MD  loratadine (CLARITIN) 10 MG tablet Take 1 tablet by mouth daily.    Historical Provider, MD  losartan (COZAAR) 50 MG tablet TAKE 1 TABLET BY MOUTH EVERY DAY 10/21/14   Vickki Muff Chrismon, PA  ONE TOUCH ULTRA TEST test strip  06/19/14   Historical Provider, MD  [place]  06/19/14   Historical Provider, MD  PARoxetine (PAXIL) 40 MG tablet Take 1 tablet (40 mg total) by mouth daily. 01/21/15   Rainey Pines, MD  simvastatin (ZOCOR) 40 MG tablet Take 1 tablet by mouth daily.    Historical Provider, MD   SpO2 96% Physical Exam  Constitutional: She is oriented to person, place, and time. She appears well-developed and well-nourished. No distress.  HENT:  Head: Normocephalic and atraumatic.  Mouth/Throat: No oropharyngeal exudate.  Eyes: Conjunctivae and EOM are normal. Pupils are equal, round, and reactive to light. Right eye exhibits no discharge. Left eye exhibits no discharge.  Neck: Normal range of motion. Neck supple.  Cardiovascular: Normal rate and regular rhythm.  Exam reveals no gallop and no friction rub.   No murmur heard. Pulmonary/Chest: Effort normal and breath sounds normal. No respiratory distress. She has no wheezes. She has no rales. She exhibits no tenderness.  Abdominal: Soft. Bowel sounds are normal. She exhibits no distension and no mass. There is no tenderness. There is no rebound and no guarding.  Musculoskeletal: Normal range of motion.  Neurological: She is alert and oriented to person, place, and time. She has normal strength. A sensory deficit is present. No cranial nerve deficit. Coordination normal. GCS eye subscore is 4. GCS verbal subscore is 5. GCS motor subscore is 6.  Pt reports decreased sensation in LUE.  Pt has slurred speech on examination.  Normal finger to nose and heel to  shin.  Skin: Skin is warm and dry. No rash noted. She is not diaphoretic. No erythema.  Psychiatric: She has a normal mood and affect. Her behavior is normal. Her speech is slurred.    ED Course  Procedures (including critical care time) Labs Review Labs Reviewed  PROTIME-INR  APTT  CBC  DIFFERENTIAL  COMPREHENSIVE METABOLIC PANEL  I-STAT [place], ED  I-STAT CHEM 8, ED  CBG MONITORING, ED    Imaging Review No results found. I have personally reviewed and evaluated these images and lab results as part of my medical decision-making.   EKG Interpretation None      MDM   Final diagnoses:  None    59 year old Caucasian female with past medical history of hypertension, hyperlipidemia, diabetes presents in the setting of concern for stroke. Per patient approximately 2 hours prior to arrival she started having slurred speech and right upper extremity numbness. Patient reports she was doing normal daily activity and denies any fevers, headaches, vision change, trauma, chest pain, shortness of breath, drug or alcohol use. Patient reported generalized weakness at initial onset which is now resolved.  On arrival patient was hemodynamically stable and quickly assessed. Patient had slurred speech and subjective numbness and right upper extremity. Otherwise neurovascularly intact. In setting of time course and symptoms code stroke was initiated. Patient had CT head performed which revealed no acute intracranial abdomen rounded. Additionally standard code stroke laboratory analysis performed which revealed elevated glucose and mild elevation in creatinine but no significant abnormality which would explain neurologic changes.  Neurology has seen and evaluate patient. At this time they do not believe TPA is indicated. They do have significant concern for likely CVA. Considering these findings patient will be admitted to medicine for further management of possible CVA. Patient stable at time of  admission to medicine.  Attending has seen and evaluated patient and Dr. Audie Pinto is in agreement with plan.    Esaw Grandchild, MD 01/29/15 2016  Leonard Schwartz, MD 01/29/15 [phone]

## 2015-01-29 NOTE — ED Notes (Signed)
Dr. Tobias Alexander and [place] stroke team at bedside.

## 2015-01-29 NOTE — ED Notes (Signed)
Neurologist returned page and reviewed CTA results, states to give pt a bolus of NS to maintain BP and to give ASA STAT.

## 2015-01-30 ENCOUNTER — Observation Stay (HOSPITAL_BASED_OUTPATIENT_CLINIC_OR_DEPARTMENT_OTHER): Payer: Medicare Other

## 2015-01-30 ENCOUNTER — Encounter (HOSPITAL_COMMUNITY): Payer: Medicare Other

## 2015-01-30 ENCOUNTER — Other Ambulatory Visit (HOSPITAL_COMMUNITY): Payer: Medicare Other

## 2015-01-30 DIAGNOSIS — Z794 Long term (current) use of insulin: Secondary | ICD-10-CM

## 2015-01-30 DIAGNOSIS — E1165 Type 2 diabetes mellitus with hyperglycemia: Secondary | ICD-10-CM | POA: Diagnosis not present

## 2015-01-30 DIAGNOSIS — I6789 Other cerebrovascular disease: Secondary | ICD-10-CM | POA: Diagnosis not present

## 2015-01-30 DIAGNOSIS — I63132 Cerebral infarction due to embolism of left carotid artery: Secondary | ICD-10-CM | POA: Diagnosis not present

## 2015-01-30 DIAGNOSIS — I639 Cerebral infarction, unspecified: Secondary | ICD-10-CM | POA: Diagnosis not present

## 2015-01-30 LAB — GLUCOSE, CAPILLARY
GLUCOSE-CAPILLARY: 218 mg/dL — AB (ref 65–99)
Glucose-Capillary: 75 mg/dL (ref 65–99)
Glucose-Capillary: 138 mg/dL — ABNORMAL HIGH (ref 65–99)
Glucose-Capillary: 206 mg/dL — ABNORMAL HIGH (ref 65–99)

## 2015-01-30 LAB — LIPID PANEL
CHOLESTEROL: 158 mg/dL (ref 0–200)
HDL: 33 mg/dL — ABNORMAL LOW (ref >40)
LDL CALC: 89 mg/dL (ref 0–99)
TRIGLYCERIDES: 182 mg/dL — AB (ref <150)
Total CHOL/HDL Ratio: 4.8 RATIO
VLDL: 36 mg/dL (ref 0–40)

## 2015-01-30 MED ORDER — SODIUM CHLORIDE 0.9 % IV SOLN
INTRAVENOUS | Status: DC
  Administered 2015-01-31: 500 mL via INTRAVENOUS

## 2015-01-30 NOTE — Progress Notes (Signed)
STROKE TEAM PROGRESS NOTE   HISTORY Jocelyn Wells is a 59 y.o. female with a history of DM, HTN who presents with sudden onset right hand weakness, right sided numbness and mild word finding difficulty that started at 2pm 01/29/2015 (LKW). She states that she noticed that she was slurring her speech. She was brought into the emergency room where a code stroke was activated. Extensive discussion was had with the patient and her son. With an NIH of 3, it is not typical to offer TPA and therefore I discussed this with the son and patient who agreed holding off on TPA at this time. Patient was not administered TPA secondary to mild symptoms. She was admitted for further evaluation and treatment.   SUBJECTIVE (INTERVAL HISTORY) Her son Merry Proud and husband Richardson Landry are at the bedside.  Overall she feels her condition is stable. Patient recounted her history with Dr. Leonie Man.    OBJECTIVE Temp:  [97.6 F (36.4 C)-98.6 F (37 C)] 98.6 F (37 C) (12/22 0938) Pulse Rate:  [80-95] 85 (12/22 0938) Cardiac Rhythm:  [-] Normal sinus rhythm (12/22 0821) Resp:  [15-24] 15 (12/22 0938) BP: (115-157)/(66-91) 139/73 mmHg (12/22 0938) SpO2:  [91 %-100 %] 93 % (12/22 0938) Weight:  [93.985 kg (207 lb 3.2 oz)] 93.985 kg (207 lb 3.2 oz) (12/21 2010)  CBC:   Recent Labs Lab 01/29/15 1635 01/29/15 1641  WBC 10.6*  --   NEUTROABS 7.4  --   HGB 11.9* 12.6  HCT 37.3 37.0  MCV 87.4  --   PLT 191  --     Basic Metabolic Panel:   Recent Labs Lab 01/29/15 1635 01/29/15 1641  NA 139 139  K 4.8 4.8  CL 105 105  CO2 24  --   GLUCOSE 211* 205*  BUN 22* 28*  CREATININE 1.33* 1.10*  CALCIUM 9.2  --     Lipid Panel:     Component Value Date/Time   CHOL 158 01/30/2015 0355   TRIG 182* 01/30/2015 0355   HDL 33* 01/30/2015 0355   CHOLHDL 4.8 01/30/2015 0355   VLDL 36 01/30/2015 0355   LDLCALC 89 01/30/2015 0355   HgbA1c:  Lab Results  Component Value Date   HGBA1C 13.3* 06/22/2013   Urine Drug  Screen: No results found for: LABOPIA, COCAINSCRNUR, LABBENZ, AMPHETMU, THCU, LABBARB    IMAGING  Dg Chest 2 View 01/29/2015  No acute cardiopulmonary findings.  Borderline cardiac enlargement. Electronically Signed   By: Marijo Sanes M.D.   On: 01/29/2015 21:50   Ct Head Wo Contrast 01/29/2015  Normal head CT   Ct Angio Head & Neck W/cm &/or Wo/cm 01/29/2015   1. High-grade stenosis of the posterior left MCA in three-vessel potentially corresponding with the patient's symptoms. 2. No focal cortical infarct is evident. 3. Mild diffuse small vessel disease. 4. Moderate tortuosity of the cervical internal carotid arteries bilaterally without significant stenosis in the neck.   Mr Brain Wo Contrast 01/29/2015   1. Patchy small volume acute left MCA territory infarcts involving the posterior left frontal lobe and left insular cortex. No associated hemorrhage or mass effect. 2. Mild atrophy with chronic small vessel ischemic disease.    PHYSICAL EXAM Pleasant elderly Caucasian lady not in distress. . Afebrile. Head is nontraumatic. Neck is supple without bruit.    Cardiac exam no murmur or gallop. Lungs are clear to auscultation. Distal pulses are well felt. Neurological Exam :  Awake alert oriented to time place and person. Speech appears  normal with only slight disfluency but no paraphasic errors. Able to name and repeat quite well. Fundi were not visualized. Vision acuity and fields seem adequate. Face is symmetric without weakness. Tongue is midline. Motor system exam reveals no upper or lower extremity drift. Symmetric and equal strength in all 4 extremities. Diminished fine finger movements on the right. Orbits left over right upper extremity. Sensation is intact bilaterally. Deep tendon reflexes are symmetric. Plantars are downgoing. Gait was not tested. ASSESSMENT/PLAN Ms. CASSEE THORNES is a 59 y.o. female with history of diabetes and hypertension presenting with right hand weakness,  right-sided numbness and mild word finding difficulty. She did not receive IV t-PA due to mild symptoms.   Stroke:  Dominant left MCA territory infarcts embolic secondary to unknown source  Resultant  Slight word hesitancy and non-fluent speech  MRI  Left MCA territory infarcts (posterior left frontal lobe, left insular cortex). small vessel disease   CTA head and neck high-grade stenosis left MCA. No significant neck stenosis.  2D Echo  pending  TEE to look for embolic source. Arranged with [place] for tomorrow.  If positive for PFO (patent foramen ovale), check bilateral lower extremity venous dopplers to rule out DVT as possible source of stroke. (I have made patient NPO after midnight tonight). If TEE negative, a [place] electrophysiologist will consult and consider placement of an implantable loop recorder to evaluate for atrial fibrillation as etiology of stroke. This has been explained to patient/family by Dr. Leonie Man and they are agreeable.   LDL 89  HgbA1c pending  Lovenox 40 mg sq daily for VTE prophylaxis Diet heart healthy/carb modified Room service appropriate?: Yes; Fluid consistency:: Thin  No antithrombotic prior to admission, now on aspirin 325 mg daily  Patient counseled to be compliant with her antithrombotic medications  Ongoing aggressive stroke risk factor management  Therapy recommendations:  pending   Disposition:  pending   Hypertension  Stable Permissive hypertension (OK if < 220/120) but gradually normalize in 5-7 days  Hyperlipidemia  Home meds:  Lipitor 10, resumed in hospital  LDL 89, goal < 70  Continue statin at discharge  Diabetes type II  HgbA1c pending, goal < 7.0  Other Stroke Risk Factors  Cigarette smoker, advised to stop smoking  Obesity, Body mass index is 33.46 kg/(m^2).   Other Active Problems  Nausea vomiting and mild diarrhea  Acute kidney  injury  Sinusitis  Hx anxiety and depression  Hospital day # [place] for Pager information 01/30/2015 11:03 AM   I have personally examined this patient, reviewed notes, independently viewed imaging studies, participated in medical decision making and plan of care. I have made any additions or clarifications directly to the above note. Agree with note above. She presented with speech difficulties, right hand weakness and numbness secondary to a left MCA branch infarct of likely embolic etiology. She remains at risk for neurological worsening, recurrent stroke, TIA needs ongoing stroke evaluation and aggressive risk factor control. I had a long discussion with the bedside with patient's husband and son regarding her presentation, prognosis, plan for treatment and evaluation and answered questions coming TEE and loop recorder tomorrow  Antony Contras, [place] Pager: [phone] 01/30/2015 2:33 PM   To contact Stroke Continuity provider, please refer to [email or web address] After hours, contact General Neurology

## 2015-01-30 NOTE — Progress Notes (Signed)
  Echocardiogram 2D Echocardiogram has been performed.  Jocelyn Wells 01/30/2015, 5:51 PM

## 2015-01-30 NOTE — Evaluation (Signed)
Physical Therapy Evaluation and Discharge Patient Details Name: Jocelyn Wells MRN: [id number] DOB: 1955/06/05 Today's Date: 01/30/2015   History of Present Illness  Adm with Rt weakness with MRI + left MCA infarcts (posterior frontal and temporal/insular) PMHx-anxiety, DM, Lt ankle fx and ORIF, recent trip/fall at [facility] with Lt knee sore/bruised    Clinical Impression  Patient evaluated by Physical Therapy with no further acute PT needs identified. PT is signing off. Thank you for this referral.     Follow Up Recommendations No PT follow up    Equipment Recommendations  None recommended by PT    Recommendations for Other Services       Precautions / Restrictions Precautions Precautions: None      Mobility  Bed Mobility Overal bed mobility: Modified Independent                Transfers Overall transfer level: Independent Equipment used: None             General transfer comment: x 2  Ambulation/Gait Ambulation/Gait assistance: Independent Ambulation Distance (Feet): 230 Feet Assistive device: None Gait Pattern/deviations: WFL(Within Functional Limits)   Gait velocity interpretation: Below normal speed for age/gender    Stairs Stairs: Yes Stairs assistance: Modified independent (Device/Increase time) Stair Management: One rail Right;Alternating pattern;Forwards Number of Stairs: 2 General stair comments: limited by IV line  Wheelchair Mobility    Modified Rankin (Stroke Patients Only) Modified Rankin (Stroke Patients Only) Pre-Morbid Rankin Score: No symptoms Modified Rankin: No significant disability (decr communication)     Balance Overall balance assessment: Independent                           High level balance activites: Side stepping;Backward walking;Direction changes;Sudden stops;Turns;Head turns High Level Balance Comments: no imbalance             Pertinent Vitals/Pain Pain Assessment: Faces Faces Pain  Scale: Hurts little more Pain Location: Rt knee Pain Descriptors / Indicators: Sore Pain Intervention(s): Limited activity within patient's tolerance;Monitored during session    Home Living Family/patient expects to be discharged to:: Private residence Living Arrangements: Spouse/significant other Available Help at Discharge: Family Type of Home: House Home Access: Stairs to enter Entrance Stairs-Rails: Can reach both Entrance Stairs-Number of Steps: 4 Home Layout: One level Home Equipment: Cane - single point      Prior Function Level of Independence: Independent with assistive device(s)         Comments: when Rt knee hurting (due to OA) uses cane for longer distances     Hand Dominance   Dominant Hand: Right    Extremity/Trunk Assessment   Upper Extremity Assessment: Defer to OT evaluation           Lower Extremity Assessment: Overall WFL for tasks assessed (Rt DF 5/5)      Cervical / Trunk Assessment: Normal  Communication   Communication: Expressive difficulties  Cognition Arousal/Alertness: Awake/alert Behavior During Therapy: WFL for tasks assessed/performed Overall Cognitive Status: Within Functional Limits for tasks assessed                      General Comments      Exercises        Assessment/Plan    PT Assessment Patent does not need any further PT services  PT Diagnosis Hemiplegia dominant side   PT Problem List    PT Treatment Interventions     PT Goals (Current goals can be  found in the Care Plan section) Acute Rehab PT Goals PT Goal Formulation: All assessment and education complete, DC therapy    Frequency     Barriers to discharge        Co-evaluation               End of Session Equipment Utilized During Treatment: Gait belt Activity Tolerance: Patient tolerated treatment well Patient left: in bed;with call bell/phone within reach;with family/visitor present Nurse Communication: Mobility status (no  further PT needed)    Functional Assessment Tool Used: clinical judgment Functional Limitation: Mobility: Walking and moving around Mobility: Walking and Moving Around Current Status [phone]): 0 percent impaired, limited or restricted Mobility: Walking and Moving Around Goal Status [phone]): 0 percent impaired, limited or restricted Mobility: Walking and Moving Around Discharge Status [phone]): 0 percent impaired, limited or restricted    Time: [id number] PT Time Calculation (min) (ACUTE ONLY): 30 min   Charges:   PT Evaluation $Initial PT Evaluation Tier I: 1 Procedure PT Treatments $Gait Training: 8-22 mins   PT G Codes:   PT G-Codes **NOT FOR INPATIENT CLASS** Functional Assessment Tool Used: clinical judgment Functional Limitation: Mobility: Walking and moving around Mobility: Walking and Moving Around Current Status [id number]): 0 percent impaired, limited or restricted Mobility: Walking and Moving Around Goal Status [id number]): 0 percent impaired, limited or restricted Mobility: Walking and Moving Around Discharge Status [id number]): 0 percent impaired, limited or restricted    Ladasha Schnackenberg 01/30/2015, 1:37 PM Pager [phone]

## 2015-01-30 NOTE — Evaluation (Signed)
Occupational Therapy Evaluation and Discharge  Patient Details Name: Jocelyn Wells MRN: [id number] DOB: 1955/07/09 Today's Date: 01/30/2015    History of Present Illness Adm with Rt weakness with MRI + left MCA infarcts (posterior frontal and temporal/insular) PMHx-anxiety, DM, Lt ankle fx and ORIF, recent trip/fall at [facility] with Lt knee sore/bruised   Clinical Impression   Pt able to perform ADL and ADL transfers at a modified independent to independent level.  Family reports significant improvement since admission. Son concerned with pt's poor attention to her health as far as what she eats and her smoking. Educated pt and family in s/s of stroke and encouraged them to read stroke education booklet. No further OT needs.    Follow Up Recommendations  No OT follow up    Equipment Recommendations  None recommended by OT    Recommendations for Other Services       Precautions / Restrictions Precautions Precautions: None Restrictions Weight Bearing Restrictions: No      Mobility Bed Mobility Overal bed mobility: Independent                Transfers Overall transfer level: Independent Equipment used: None             General transfer comment: toilet, shower, bed    Balance Overall balance assessment: Independent                                       ADL Overall ADL's : At baseline                                       General ADL Comments: Pt demonstrated ability to perform toilet transfer and shower transfer at a modified independent level.     Vision     Perception     Praxis      Pertinent Vitals/Pain Pain Assessment: Faces Faces Pain Scale: Hurts little more Pain Location: R knee Pain Descriptors / Indicators: Sore Pain Intervention(s): Limited activity within patient's tolerance;Monitored during session;Repositioned     Hand Dominance Right   Extremity/Trunk Assessment Upper Extremity  Assessment Upper Extremity Assessment: RUE deficits/detail RUE Deficits / Details: good strength, mild drift RUE Sensation: decreased proprioception (very slight) RUE Coordination:  (handwriting appears at baseline, managing eating utensils)   Lower Extremity Assessment Lower Extremity Assessment: Defer to PT evaluation   Cervical / Trunk Assessment Cervical / Trunk Assessment: Normal   Communication Communication Communication: Expressive difficulties   Cognition Arousal/Alertness: Awake/alert Behavior During Therapy: WFL for tasks assessed/performed Overall Cognitive Status: Within Functional Limits for tasks assessed                     General Comments       Exercises       Shoulder Instructions      Home Living Family/patient expects to be discharged to:: Private residence Living Arrangements: Spouse/significant other;Other relatives (cousin) Available Help at Discharge: Family;Available 24 hours/day Type of Home: House Home Access: Stairs to enter [employer] of Steps: 4 Entrance Stairs-Rails: Can reach both Home Layout: One level     Bathroom Shower/Tub: Occupational psychologist: Handicapped height     Home Equipment: Cane - single point          Prior Functioning/Environment Level of Independence: Independent with  assistive device(s)        Comments: when Rt knee hurting (due to OA) uses cane for longer distances    OT Diagnosis: Generalized weakness   OT Problem List:     OT Treatment/Interventions:      OT Goals(Current goals can be found in the care plan section)    OT Frequency:     Barriers to D/C:            Co-evaluation              End of Session    Activity Tolerance: Patient tolerated treatment well Patient left: in bed;with call bell/phone within reach;with family/visitor present   Time: [id number] OT Time Calculation (min): 15 min Charges:  OT General Charges $OT Visit: 1  Procedure OT Evaluation $Initial OT Evaluation Tier I: 1 Procedure G-Codes: OT G-codes **NOT FOR INPATIENT CLASS** Functional Assessment Tool Used: clinical judgement Functional Limitation: Self care Self Care Current Status [id number]): 0 percent impaired, limited or restricted Self Care Goal Status [id number]): 0 percent impaired, limited or restricted Self Care Discharge Status [id number]): 0 percent impaired, limited or restricted  Malka So 01/30/2015, 3:32 PM  [phone]

## 2015-01-30 NOTE — Progress Notes (Signed)
    [facility] has been requested to perform a transesophageal echocardiogram on 01/31/15 for CVA.  After careful review of history and examination, the risks and benefits of transesophageal echocardiogram have been explained including risks of esophageal damage, perforation (1:10,000 risk), bleeding, pharyngeal hematoma as well as other potential complications associated with conscious sedation including aspiration, arrhythmia, respiratory failure and death. Alternatives to treatment were discussed, questions were answered. Patient is willing to proceed. Her oldest son and another son were with her at time of explanation.  They agreed to the procedure.  Cortlandt Capuano R,  01/30/2015 2:53 PM

## 2015-01-30 NOTE — Progress Notes (Addendum)
Patient Demographics  Jocelyn Wells, is a 59 y.o. female, DOB - 11-May-1955, [id number]  Admit date - 01/29/2015   Admitting Physician Elmarie Shiley, MD  Outpatient Primary MD for the patient is Vernie Murders, PA  LOS - 1   Chief Complaint  Patient presents with  . Code Stroke       Admission HPI/Brief narrative: 59 y.o. female with PMH significant for Depression, Diabetes, HTN, MDD, who presents with sudden onset of right hand weakness, right side Numbness, and aphasia, MRI significant for left MCA or territory infarcts.  Subjective:   Jocelyn Wells today has, No headache, No chest pain, No abdominal pain , No Cough - SOB.   Assessment & Plan    Active Problems:   H/O diabetes mellitus   H/O: HTN (hypertension)   Anxiety, generalized   Diabetes mellitus type 2, uncontrolled (HCC)   Nausea & vomiting   AKI (acute kidney injury) ([place])   CVA (cerebral infarction)   Stroke (cerebrum) (HCC)  Acute ischemic CVA - MRI brain: Left MCA territory infarct(posterior left frontal lobe, left insular cortex) - CTA head and neck: High-grade stenosis left MCA, no significant neck stenosis - 2-D echo: Pending - Most likely embolic source, neurology consult greatly appreciated, they arranged for TEE in a.m. with [facility], / possible loop recorder if no embolic source could be identified with TEE - LDL is 89, Lipitor at home - Glycohemoglobin A1c pending - Started on aspirin 325 mg oral daily - PT/OT/SLP consulted  Hypertension - Allow for permissive hypertension  Diabetes mellitus - Hold oral hypoglycemic medication - Continue with insulin sliding scale - Follow on hemoglobin A1c  Hyperlipidemia - LDL is 89, patient is on Lipitor 10 mg oral at home   Code Status: Full  Family Communication: Discussed with family at bedside  Disposition Plan: Pending further workup   Procedures    None    Consults   Neurology   Medications  Scheduled Meds: . atorvastatin  10 mg Oral q1800  . clonazePAM  0.5 mg Oral QHS  . enoxaparin (LOVENOX) injection  40 mg Subcutaneous Q24H  . insulin aspart  0-9 Units Subcutaneous TID WC  . PARoxetine  40 mg Oral Daily   Continuous Infusions: . sodium chloride 100 mL/hr at 01/30/15 0800   PRN Meds:.senna-docusate  DVT Prophylaxis  Lovenox   Lab Results  Component Value Date   PLT 191 01/29/2015    Antibiotics    Anti-infectives    None          Objective:   Filed Vitals:   01/30/15 0430 01/30/15 0630 01/30/15 0938 01/30/15 1355  BP: 157/85 146/88 139/73 137/75  Pulse: 84 85 85 77  Temp: 98 F (36.7 C) 97.8 F (36.6 C) 98.6 F (37 C) 98.3 F (36.8 C)  TempSrc: Axillary Oral Oral Oral  Resp: 18 18 15 17   Height:      Weight:      SpO2: 92% 95% 93% 93%    Wt Readings from Last 3 Encounters:  01/29/15 93.985 kg (207 lb 3.2 oz)  01/21/15 93.895 kg (207 lb)  01/21/15 94.439 kg (208 lb 3.2 oz)     Intake/Output Summary (Last 24 hours) at 01/30/15 1546 Last data filed  at 01/30/15 1200  Gross per 24 hour  Intake    960 ml  Output      0 ml  Net    960 ml     Physical Exam  Awake Alert, Oriented X 3,  Supple Neck,No JVD, No cervical lymphadenopathy appriciated.  Symmetrical Chest wall movement, Good air movement bilaterally, CTAB RRR,No Gallops,Rubs or new Murmurs, No Parasternal Heave +ve B.Sounds, Abd Soft, No tenderness, No organomegaly appriciated, No rebound - guarding or rigidity. No Cyanosis, Clubbing or edema, No new Rash or bruise     Data Review   Micro Results No results found for this or any previous visit (from the past 240 hour(s)).  Radiology Reports Ct Angio Head W/cm &/or Wo Cm  01/29/2015  CLINICAL DATA:  Slurred speech.  Right sensory deficit. EXAM: CT ANGIOGRAPHY HEAD AND NECK TECHNIQUE: Multidetector CT imaging of the head and neck was performed using the standard  protocol during bolus administration of intravenous contrast. Multiplanar CT image reconstructions and MIPs were obtained to evaluate the vascular anatomy. Carotid stenosis measurements (when applicable) are obtained utilizing NASCET criteria, using the distal internal carotid diameter as the denominator. CONTRAST:  71mL OMNIPAQUE IOHEXOL 350 MG/ML SOLN COMPARISON:  CT head without contrast 01/29/2015. FINDINGS: CT HEAD Brain: The source images demonstrate no acute infarct. The basal ganglia are intact. The insular ribbon is intact. The brainstem and cerebellum is unremarkable. The ventricles are of normal size. No significant extra-axial fluid collection is present. Calvarium and skull base: Within normal limits. Paranasal sinuses: Clear Orbits: The globes and orbits are intact. CTA NECK Aortic arch: A 3 vessel arch configuration is present. Minimal calcification is present at the origin of the left subclavian artery relative to the more distal vessel. Right carotid system: The right common carotid artery is within normal limits. Calcifications are present at the right carotid bifurcation without a significant stenosis. There is moderate tortuosity in the midcervical right ICA without a significant stenosis of greater than 50% relative to the more distal vessel. Left carotid system: The left common carotid artery is within normal limits. Atherosclerotic changes are present at the carotid bifurcation without a significant stenosis. Moderate tortuosity is present in the mid cervical left ICA without a significant stenosis of greater than 50% relative to the more distal vessel. Vertebral arteries:The vertebral arteries are codominant. Both vertebral arteries originate from the subclavian arteries without significant stenoses. There are no significant stenoses or focal injuries within the cervical vertebral arteries. Skeleton: No focal lytic or blastic lesions are present. The patient is edentulous. The cervical spine  is unremarkable. Other neck: No focal mucosal or submucosal lesions present. The vocal cords are midline and symmetric. Thyroid is within normal limits. The salivary glands are unremarkable. There is no significant adenopathy. Minimal ground-glass attenuation at the lung apices likely reflects atelectasis. CTA HEAD Anterior circulation: Minimal atherosclerotic calcifications are present in the cavernous internal carotid arteries bilaterally without a significant stenosis. The A1 and M1 segments are normal. The anterior communicating artery is patent. The MCA bifurcations are intact. There is a high-grade stenosis in the posterior inferior left M3 branch vessel. Other distal small vessel disease is present bilaterally. There is moderate attenuation of distal ACA branch vessels bilaterally as well. Posterior circulation: The vertebral arteries are codominant. The PICA origins are visualized and normal. The basilar artery is within normal limits. Both posterior cerebral arteries originate from the basilar tip. The left posterior communicating artery contributes. There is some attenuation of distal PCA  branch vessels. Venous sinuses: The dural sinuses are patent. The left transverse sinus is dominant. The straight sinus and deep cerebral veins are intact. Cortical veins are within normal limits. Anatomic variants: Non Delayed phase: The postcontrast images demonstrate no pathologic enhancement. IMPRESSION: 1. High-grade stenosis of the posterior left MCA in three-vessel potentially corresponding with the patient's symptoms. 2. No focal cortical infarct is evident. 3. Mild diffuse small vessel disease. 4. Moderate tortuosity of the cervical internal carotid arteries bilaterally without significant stenosis in the neck. Electronically Signed   By: San Morelle M.D.   On: 01/29/2015 18:28   Dg Chest 2 View  01/29/2015  CLINICAL DATA:  CVA. EXAM: CHEST  2 VIEW COMPARISON:  03/27/2012 FINDINGS: The heart  borderline enlarged but stable. The mediastinal and hilar contours are within normal limits and unchanged. The lungs are clear of acute process. No pleural effusion. The bony thorax is intact. IMPRESSION: No acute cardiopulmonary findings.  Borderline cardiac enlargement. Electronically Signed   By: Marijo Sanes M.D.   On: 01/29/2015 21:50   Ct Head Wo Contrast  01/29/2015  ADDENDUM REPORT: 01/29/2015 16:59 ADDENDUM: These results were called by telephone at the time of interpretation on 01/29/2015 at 4:55 pm to Dr. Leonel Ramsay, who verbally acknowledged these results. Electronically Signed   By: Nelson Chimes M.D.   On: 01/29/2015 16:59  01/29/2015  CLINICAL DATA:  Code stroke. Slurred speech. Numbness of the right fingers. Last seen normal 3 hours ago. EXAM: CT HEAD WITHOUT CONTRAST TECHNIQUE: Contiguous axial images were obtained from the base of the skull through the vertex without intravenous contrast. COMPARISON:  None. FINDINGS: The brain has a normal appearance without evidence of malformation, atrophy, old or acute infarction, mass lesion, hemorrhage, hydrocephalus or extra-axial collection. The calvarium is unremarkable. The paranasal sinuses, middle ears and mastoids are clear. IMPRESSION: Normal head CT I am in the process of calling this report at this time. Electronically Signed: By: Nelson Chimes M.D. On: 01/29/2015 16:56   Ct Angio Neck W/cm &/or Wo/cm  01/29/2015  CLINICAL DATA:  Slurred speech.  Right sensory deficit. EXAM: CT ANGIOGRAPHY HEAD AND NECK TECHNIQUE: Multidetector CT imaging of the head and neck was performed using the standard protocol during bolus administration of intravenous contrast. Multiplanar CT image reconstructions and MIPs were obtained to evaluate the vascular anatomy. Carotid stenosis measurements (when applicable) are obtained utilizing NASCET criteria, using the distal internal carotid diameter as the denominator. CONTRAST:  70mL OMNIPAQUE IOHEXOL 350 MG/ML SOLN  COMPARISON:  CT head without contrast 01/29/2015. FINDINGS: CT HEAD Brain: The source images demonstrate no acute infarct. The basal ganglia are intact. The insular ribbon is intact. The brainstem and cerebellum is unremarkable. The ventricles are of normal size. No significant extra-axial fluid collection is present. Calvarium and skull base: Within normal limits. Paranasal sinuses: Clear Orbits: The globes and orbits are intact. CTA NECK Aortic arch: A 3 vessel arch configuration is present. Minimal calcification is present at the origin of the left subclavian artery relative to the more distal vessel. Right carotid system: The right common carotid artery is within normal limits. Calcifications are present at the right carotid bifurcation without a significant stenosis. There is moderate tortuosity in the midcervical right ICA without a significant stenosis of greater than 50% relative to the more distal vessel. Left carotid system: The left common carotid artery is within normal limits. Atherosclerotic changes are present at the carotid bifurcation without a significant stenosis. Moderate tortuosity is present in the mid  cervical left ICA without a significant stenosis of greater than 50% relative to the more distal vessel. Vertebral arteries:The vertebral arteries are codominant. Both vertebral arteries originate from the subclavian arteries without significant stenoses. There are no significant stenoses or focal injuries within the cervical vertebral arteries. Skeleton: No focal lytic or blastic lesions are present. The patient is edentulous. The cervical spine is unremarkable. Other neck: No focal mucosal or submucosal lesions present. The vocal cords are midline and symmetric. Thyroid is within normal limits. The salivary glands are unremarkable. There is no significant adenopathy. Minimal ground-glass attenuation at the lung apices likely reflects atelectasis. CTA HEAD Anterior circulation: Minimal  atherosclerotic calcifications are present in the cavernous internal carotid arteries bilaterally without a significant stenosis. The A1 and M1 segments are normal. The anterior communicating artery is patent. The MCA bifurcations are intact. There is a high-grade stenosis in the posterior inferior left M3 branch vessel. Other distal small vessel disease is present bilaterally. There is moderate attenuation of distal ACA branch vessels bilaterally as well. Posterior circulation: The vertebral arteries are codominant. The PICA origins are visualized and normal. The basilar artery is within normal limits. Both posterior cerebral arteries originate from the basilar tip. The left posterior communicating artery contributes. There is some attenuation of distal PCA branch vessels. Venous sinuses: The dural sinuses are patent. The left transverse sinus is dominant. The straight sinus and deep cerebral veins are intact. Cortical veins are within normal limits. Anatomic variants: Non Delayed phase: The postcontrast images demonstrate no pathologic enhancement. IMPRESSION: 1. High-grade stenosis of the posterior left MCA in three-vessel potentially corresponding with the patient's symptoms. 2. No focal cortical infarct is evident. 3. Mild diffuse small vessel disease. 4. Moderate tortuosity of the cervical internal carotid arteries bilaterally without significant stenosis in the neck. Electronically Signed   By: San Morelle M.D.   On: 01/29/2015 18:28   Mr Brain Wo Contrast  01/29/2015  CLINICAL DATA:  Initial evaluation for acute right-sided weakness, slurred speech. EXAM: MRI HEAD WITHOUT CONTRAST TECHNIQUE: Multiplanar, multiecho pulse sequences of the brain and surrounding structures were obtained without intravenous contrast. COMPARISON:  Prior study from earlier the same day. FINDINGS: Study degraded by motion artifact. Patchy serpiginous areas of acute infarction involving the periventricular and deep white  matter of the posterior left frontal lobe (series 3, image 34) as well as the overlying cortical gray matter within the operculum (series 3, image 36). Corresponding signal loss seen on ADC map. Mild involvement of the posterior left insular cortex (series 3, image 30). No associated hemorrhage or mass effect. No other infarct. Intracranial vascular flow voids preserved. Mild atrophy with chronic small vessel ischemic changes. No mass lesion, midline shift, or mass effect. No hydrocephalus. No extra-axial fluid collection. Craniocervical junction within normal limits. The pituitary gland normal. No acute abnormality about the orbits. Paranasal sinuses are grossly clear. No mastoid effusion. Inner ear structures grossly normal. Bone marrow signal intensity within normal limits. No scalp soft tissue abnormality. IMPRESSION: 1. Patchy small volume acute left MCA territory infarcts involving the posterior left frontal lobe and left insular cortex. No associated hemorrhage or mass effect. 2. Mild atrophy with chronic small vessel ischemic disease. Electronically Signed   By: Jeannine Boga M.D.   On: 01/29/2015 23:05     CBC  Recent Labs Lab 01/29/15 1635 01/29/15 1641  WBC 10.6*  --   HGB 11.9* 12.6  HCT 37.3 37.0  PLT 191  --   MCV 87.4  --  MCH 27.9  --   MCHC 31.9  --   RDW 13.7  --   LYMPHSABS 2.6  --   MONOABS 0.5  --   EOSABS 0.1  --   BASOSABS 0.0  --     Chemistries   Recent Labs Lab 01/29/15 1635 01/29/15 1641  NA 139 139  K 4.8 4.8  CL 105 105  CO2 24  --   GLUCOSE 211* 205*  BUN 22* 28*  CREATININE 1.33* 1.10*  CALCIUM 9.2  --   AST 17  --   ALT 17  --   ALKPHOS 82  --   BILITOT 0.5  --    ------------------------------------------------------------------------------------------------------------------ estimated creatinine clearance is 62.8 mL/min (by C-G formula based on Cr of  1.1). ------------------------------------------------------------------------------------------------------------------ No results for input(s): HGBA1C in the last 72 hours. ------------------------------------------------------------------------------------------------------------------  Recent Labs  01/30/15 0355  CHOL 158  HDL 33*  LDLCALC 89  TRIG 182*  CHOLHDL 4.8   ------------------------------------------------------------------------------------------------------------------ No results for input(s): TSH, T4TOTAL, T3FREE, THYROIDAB in the last 72 hours.  Invalid input(s): FREET3 ------------------------------------------------------------------------------------------------------------------ No results for input(s): VITAMINB12, FOLATE, FERRITIN, TIBC, IRON, RETICCTPCT in the last 72 hours.  Coagulation profile  Recent Labs Lab 01/29/15 1635  INR 1.05    No results for input(s): DDIMER in the last 72 hours.  Cardiac Enzymes No results for input(s): CKMB, TROPONINI, MYOGLOBIN in the last 168 hours.  Invalid input(s): CK ------------------------------------------------------------------------------------------------------------------ Invalid input(s): POCBNP     Time Spent in minutes   30 minutes   Tasnia Spegal M.D on 01/30/2015 at 3:46 PM  Between 7am to 7pm - Pager - [phone]  After 7pm go to [email or web address] - password [facility]  [facility]   Office  [phone]

## 2015-01-30 NOTE — Evaluation (Signed)
Speech Language Pathology Evaluation Patient Details Name: Jocelyn Wells MRN: [id number] DOB: December 24, 1955 Today's Date: 01/30/2015 Time: [id number] SLP Time Calculation (min) (ACUTE ONLY): 22 min  Problem List:  Patient Active Problem List   Diagnosis Date Noted  . Nausea & vomiting 01/29/2015  . AKI (acute kidney injury) ([place]) 01/29/2015  . CVA (cerebral infarction) 01/29/2015  . Stroke (cerebrum) ([place]) 01/29/2015  . Dizziness and giddiness 07/05/2014  . Blood pressure elevated 07/05/2014  . Carbuncle and furuncle 07/05/2014  . BP (high blood pressure) 07/05/2014  . Does not feel right 07/05/2014  . Pain of perianal area 07/05/2014  . Guttate psoriasis 07/05/2014  . Gonalgia 07/05/2014  . H/O diabetes mellitus 06/05/2014  . CAFL (chronic airflow limitation) ([place]) 06/05/2014  . H/O visual disturbance 06/05/2014  . H/O elevated lipids 06/05/2014  . H/O: HTN (hypertension) 06/05/2014  . H/O: osteoarthritis 06/05/2014  . Initial insomnia 06/05/2014  . Anxiety, generalized 06/05/2014  . Depression, major, recurrent, moderate ([place]) 06/05/2014  . Microalbuminuria 12/23/2013  . Long term current use of insulin ([place]) 12/23/2013  . Compulsive tobacco user syndrome 12/23/2013  . Contusion of cheek 03/31/2009  . Change in blood platelet count 06/08/2007  . Blood in feces 06/01/2007  . D (diarrhea) 06/01/2007  . Disturbance of skin sensation 09/02/2006  . Difficulty hearing 07/25/2006  . Acute onset aura migraine 06/27/2006  . Cephalalgia 05/27/2006  . Clinical depression 10/22/2005  . Diabetes mellitus type 2, uncontrolled ([place]) 10/22/2005  . HLD (hyperlipidemia) 10/22/2005  . Arthritis, degenerative 10/22/2005  . Current tobacco use 10/22/2005   Past Medical History:  Past Medical History  Diagnosis Date  . Anxiety   . Depression   . Diabetes mellitus, type II [facility])    Past Surgical History:  Past Surgical History  Procedure Laterality Date  . Abdominal hysterectomy     . Tubal ligation  1978  . Cesarean section  1978  . Knee arthroscopy Left 2005  . Ulnar nerve transposition  2008  . Bilateral salpingoophorectomy  2000  . Ankle fracture surgery Left [id number]   HPI:  59 year old female admitted with Rt weakness with MRI + left MCA infarcts (posterior frontal and temporal/insular) with slight word finding difficulty reported. PMHx-anxiety, DM, Lt ankle fx and ORIF, recent trip/fall  with Lt knee sore/bruised.     Assessment / Plan / Recommendation Clinical Impression  Cognitive-linguistic evaluation complete. Cognitive skills intact. 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 No word finding deficits noted during today's exam. Recommend OP SLP f/u for dysarthria after d/c. Provided patient and family with basic speech intelligibility strategies to utilize. Verbalizes understanding.       SLP Assessment  All further Speech Lanaguage Pathology  needs can be addressed in the next venue of care    Follow Up Recommendations  Outpatient SLP          SLP Evaluation Prior Functioning  Type of Home: House Available Help at Discharge: Family;Available 24 hours/day   Cognition  Overall Cognitive Status: Within Functional Limits for tasks assessed Orientation Level: Oriented X4    Comprehension  Auditory Comprehension Overall Auditory Comprehension: Appears within functional limits for tasks assessed Visual Recognition/Discrimination Discrimination: Within Function Limits Reading Comprehension Reading Status: Within funtional limits    Expression Expression Primary Mode of Expression: Verbal Verbal Expression Overall Verbal Expression: Appears within functional limits for tasks assessed Written Expression Dominant Hand: Right   Oral / Motor Motor Speech Overall  Motor Speech: Impaired Respiration: Within functional limits Phonation: Normal Resonance: Within functional  limits Articulation: Impaired Level of Impairment: Word Intelligibility: Intelligible Motor Planning: Witnin functional limits Effective Techniques: Over-articulate   Gabriel Rainwater MA, CCC-SLP [phone]  Gabriel Rainwater Meryl 01/30/2015, 3:33 PM

## 2015-01-30 NOTE — Consult Note (Signed)
ELECTROPHYSIOLOGY CONSULT NOTE  Patient ID: Jocelyn Wells MRN: [id number], DOB/AGE: 60/18/1957   Admit date: 01/29/2015 Date of Consult: 01/30/2015  Primary Physician: Vernie Murders, PA Primary Cardiologist:  Reason for Consultation: Cryptogenic stroke - 01/29/15; recommendations regarding Implantable Loop Recorder  History of Present Illness Jocelyn Wells was admitted on 01/29/2015 with acute CVA.  They first developed symptoms of difficulty finding words, slurred speech and some degree of R sided weakness.  She reports these symptoms have essentially resolved.   Imaging demonstrated Dominant left MCA territory infarcts embolic secondary to unknown source.  she has undergone workup for stroke including echocardiogram and carotid angio.  The patient has been monitored on telemetry which has demonstrated sinus rhythm with no arrhythmias.  Inpatient stroke work-up is to be completed with a TEE. She has PMHx of HTN, DM, and COPD, she is an active smoker and counseled, she reports she will work at this.  Echocardiogram this admission demonstrated  Study Conclusions - Left ventricle: The cavity size was normal. Wall thickness was increased in a pattern of mild LVH. Systolic function was moderately to severely reduced. The estimated ejection fraction was in the range of 30% to 35%. Diffuse hypokinesis. Features are consistent with a pseudonormal left ventricular filling pattern, with concomitant abnormal relaxation and increased filling pressure (grade 2 diastolic dysfunction). - Left atrium: The atrium was mildly dilated. - Pericardium, extracardiac: A small pericardial effusion was identified. Impressions: - Moderate to severe global reduction in LV systolic function; mild LVH; grade 2 diastolic dysfunction; mild LAE; trace MR and TR; small pericardial effusion.  Lab work is reviewed.  Prior to admission, the patient denies any kind of chest pain, dizziness,  palpitations, or syncope.  She denies any known cardiac history, has never been evaluated by a cardiologist in the past or has an echo before.  She reports baseline SOB with her COPD, not escalating or changed in the last couple years.  No dizziness, near syncope or syncope history.  No symptoms of orthopnea or PND.  They are recovering from their stroke with plans to home at discharge.  EP has been asked to evaluate for placement of an implantable loop recorder to monitor for atrial fibrillation.     Past Medical History  Diagnosis Date  . Anxiety   . Depression   . Diabetes mellitus, type II [facility])      Surgical History:  Past Surgical History  Procedure Laterality Date  . Abdominal hysterectomy    . Tubal ligation  1978  . Cesarean section  1978  . Knee arthroscopy Left 2005  . Ulnar nerve transposition  2008  . Bilateral salpingoophorectomy  2000  . Ankle fracture surgery Left 2002     Prescriptions prior to admission  Medication Sig Dispense Refill Last Dose  . amoxicillin-clavulanate (AUGMENTIN) 875-125 MG tablet Take 1 tablet by mouth 2 (two) times daily. 20 tablet 0 01/29/2015 at Unknown time  . Apremilast (OTEZLA) 30 MG TABS Take 30 mg by mouth 2 (two) times daily.   01/29/2015 at Unknown time  . atorvastatin (LIPITOR) 10 MG tablet Take 1 tablet (10 mg total) by mouth daily. 90 tablet 0 01/29/2015 at Unknown time  . benzonatate (TESSALON) 100 MG capsule TAKE ONE CAPSULE BY MOUTH EVERY NIGHT AT BEDTIME 30 capsule 1 01/28/2015 at Unknown time  . clonazePAM (KLONOPIN) 0.5 MG tablet Take 1 tablet (0.5 mg total) by mouth at bedtime. 30 tablet 2 01/28/2015 at Unknown time  . diclofenac (VOLTAREN) 75  MG EC tablet TAKE 1 TABLET BY MOUTH TWICE A DAY AS NEEDED 60 tablet 3 Past Week at Unknown time  . glyBURIDE-metformin (GLUCOVANCE) 5-500 MG per tablet Take 1 tablet by mouth 2 (two) times daily.   01/29/2015 at Unknown time  . Insulin Glargine (TOUJEO SOLOSTAR) 300 UNIT/ML SOPN  Inject 60 Units into the skin every evening.   01/28/2015 at Unknown time  . loratadine (CLARITIN) 10 MG tablet Take 1 tablet by mouth daily.   Past Week at Unknown time  . losartan (COZAAR) 50 MG tablet TAKE 1 TABLET BY MOUTH EVERY DAY 30 tablet 6 01/29/2015 at Unknown time  . ONE TOUCH ULTRA TEST test strip    Taking  . [place] LANCETS FINE MISC    Taking  . PARoxetine (PAXIL) 40 MG tablet Take 1 tablet (40 mg total) by mouth daily. 90 tablet 3 01/28/2015 at Unknown time  . zolpidem (AMBIEN) 5 MG tablet Take 5 mg by mouth at bedtime.   01/28/2015 at Unknown time    Inpatient Medications:  . atorvastatin  10 mg Oral q1800  . clonazePAM  0.5 mg Oral QHS  . enoxaparin (LOVENOX) injection  40 mg Subcutaneous Q24H  . insulin aspart  0-9 Units Subcutaneous TID WC  . PARoxetine  40 mg Oral Daily    Allergies:  Allergies  Allergen Reactions  . Codeine Nausea And Vomiting    Social History   Social History  . Marital Status: Married    Spouse Name: N/A  . Number of Children: N/A  . Years of Education: N/A   Occupational History  . Not on file.   Social History Main Topics  . Smoking status: Current Every Day Smoker -- 1.00 packs/day for 35 years    Types: Cigarettes    Start date: 02/08/1974  . Smokeless tobacco: Never Used  . Alcohol Use: No  . Drug Use: No  . Sexual Activity: Yes    Birth Control/ Protection: None   Other Topics Concern  . Not on file   Social History Narrative     Family History  Problem Relation Age of Onset  . Heart disease Mother     died from CHF  . Asthma Mother   . Diabetes Mother   . Heart disease Father   . Aneurysm Father   . Diabetes Brother   . Alcohol abuse Paternal Aunt       Review of Systems: All other systems reviewed and are otherwise negative except as noted above.  Physical Exam: Filed Vitals:   01/30/15 0230 01/30/15 0430 01/30/15 0630 01/30/15 0938  BP: 151/90 157/85 146/88 139/73  Pulse: 85 84 85 85    Temp: 97.9 F (36.6 C) 98 F (36.7 C) 97.8 F (36.6 C) 98.6 F (37 C)  TempSrc: Axillary Axillary Oral Oral  Resp: 18 18 18 15   Height:      Weight:      SpO2: 95% 92% 95% 93%    GEN- The patient is well appearing, obese, alert and oriented x 3 today.   Head- normocephalic, atraumatic Eyes-  Sclera clear, conjunctiva pink Ears- hearing intact Oropharynx- clear Neck- supple Lungs- Clear to ausculation bilaterally, normal work of breathing Heart- Regular rate and rhythm, no murmurs, rubs or gallops  GI- soft, NT, ND Extremities- no clubbing, cyanosis, or edema MS- no significant deformity or atrophy Skin- no rash or lesion Psych- euthymic mood, full affect   Labs:   Lab Results  Component Value Date  WBC 10.6* 01/29/2015   HGB 12.6 01/29/2015   HCT 37.0 01/29/2015   MCV 87.4 01/29/2015   PLT 191 01/29/2015    Recent Labs Lab 01/29/15 1635 01/29/15 1641  NA 139 139  K 4.8 4.8  CL 105 105  CO2 24  --   BUN 22* 28*  CREATININE 1.33* 1.10*  CALCIUM 9.2  --   PROT 6.2*  --   BILITOT 0.5  --   ALKPHOS 82  --   ALT 17  --   AST 17  --   GLUCOSE 211* 205*   No results found for: CKTOTAL, CKMB, CKMBINDEX, TROPONINI Lab Results  Component Value Date   CHOL 158 01/30/2015   Lab Results  Component Value Date   HDL 33* 01/30/2015   Lab Results  Component Value Date   LDLCALC 89 01/30/2015   Lab Results  Component Value Date   TRIG 182* 01/30/2015   Lab Results  Component Value Date   CHOLHDL 4.8 01/30/2015    Radiology/Studies:   Dg Chest 2 View 01/29/2015  CLINICAL DATA:  CVA. EXAM: CHEST  2 VIEW COMPARISON:  03/27/2012 FINDINGS: The heart borderline enlarged but stable. The mediastinal and hilar contours are within normal limits and unchanged. The lungs are clear of acute process. No pleural effusion. The bony thorax is intact. IMPRESSION: No acute cardiopulmonary findings.  Borderline cardiac enlargement. Electronically Signed   By: Marijo Sanes  M.D.   On: 01/29/2015 21:50   Ct Head Wo Contrast 01/29/2015  ADDENDUM REPORT: 01/29/2015 16:59 ADDENDUM: These results were called by telephone at the time of interpretation on 01/29/2015 at 4:55 pm to Dr. Leonel Ramsay, who verbally acknowledged these results. Electronically Signed   By: Nelson Chimes M.D.   On: 01/29/2015 16:59  01/29/2015  CLINICAL DATA:  Code stroke. Slurred speech. Numbness of the right fingers. Last seen normal 3 hours ago. EXAM: CT HEAD WITHOUT CONTRAST TECHNIQUE: Contiguous axial images were obtained from the base of the skull through the vertex without intravenous contrast. COMPARISON:  None. FINDINGS: The brain has a normal appearance without evidence of malformation, atrophy, old or acute infarction, mass lesion, hemorrhage, hydrocephalus or extra-axial collection. The calvarium is unremarkable. The paranasal sinuses, middle ears and mastoids are clear. IMPRESSION: Normal head CT I am in the process of calling this report at this time. Electronically Signed: By: Nelson Chimes M.D. On: 01/29/2015 16:56   Ct Angio Neck W/cm &/or Wo/cm 01/29/2015  CLINICAL DATA:  Slurred speech.  Right sensory deficit. EXAM: CT ANGIOGRAPHY HEAD AND NECK TECHNIQUE: Multidetector CT imaging of the head and neck was performed using the standard protocol during bolus administration of intravenous contrast. Multiplanar CT image reconstructions and MIPs were obtained to evaluate the vascular anatomy. Carotid stenosis measurements (when applicable) are obtained utilizing NASCET criteria, using the distal internal carotid diameter as the denominator. CONTRAST:  27mL OMNIPAQUE IOHEXOL 350 MG/ML SOLN COMPARISON:  CT head without contrast 01/29/2015. FINDINGS: CT HEAD Brain: The source images demonstrate no acute infarct. The basal ganglia are intact. The insular ribbon is intact. The brainstem and cerebellum is unremarkable. The ventricles are of normal size. No significant extra-axial fluid collection is  present. Calvarium and skull base: Within normal limits. Paranasal sinuses: Clear Orbits: The globes and orbits are intact. CTA NECK Aortic arch: A 3 vessel arch configuration is present. Minimal calcification is present at the origin of the left subclavian artery relative to the more distal vessel. Right carotid system: The right common carotid  artery is within normal limits. Calcifications are present at the right carotid bifurcation without a significant stenosis. There is moderate tortuosity in the midcervical right ICA without a significant stenosis of greater than 50% relative to the more distal vessel. Left carotid system: The left common carotid artery is within normal limits. Atherosclerotic changes are present at the carotid bifurcation without a significant stenosis. Moderate tortuosity is present in the mid cervical left ICA without a significant stenosis of greater than 50% relative to the more distal vessel. Vertebral arteries:The vertebral arteries are codominant. Both vertebral arteries originate from the subclavian arteries without significant stenoses. There are no significant stenoses or focal injuries within the cervical vertebral arteries. Skeleton: No focal lytic or blastic lesions are present. The patient is edentulous. The cervical spine is unremarkable. Other neck: No focal mucosal or submucosal lesions present. The vocal cords are midline and symmetric. Thyroid is within normal limits. The salivary glands are unremarkable. There is no significant adenopathy. Minimal ground-glass attenuation at the lung apices likely reflects atelectasis. CTA HEAD Anterior circulation: Minimal atherosclerotic calcifications are present in the cavernous internal carotid arteries bilaterally without a significant stenosis. The A1 and M1 segments are normal. The anterior communicating artery is patent. The MCA bifurcations are intact. There is a high-grade stenosis in the posterior inferior left M3 branch  vessel. Other distal small vessel disease is present bilaterally. There is moderate attenuation of distal ACA branch vessels bilaterally as well. Posterior circulation: The vertebral arteries are codominant. The PICA origins are visualized and normal. The basilar artery is within normal limits. Both posterior cerebral arteries originate from the basilar tip. The left posterior communicating artery contributes. There is some attenuation of distal PCA branch vessels. Venous sinuses: The dural sinuses are patent. The left transverse sinus is dominant. The straight sinus and deep cerebral veins are intact. Cortical veins are within normal limits. Anatomic variants: Non Delayed phase: The postcontrast images demonstrate no pathologic enhancement. IMPRESSION: 1. High-grade stenosis of the posterior left MCA in three-vessel potentially corresponding with the patient's symptoms. 2. No focal cortical infarct is evident. 3. Mild diffuse small vessel disease. 4. Moderate tortuosity of the cervical internal carotid arteries bilaterally without significant stenosis in the neck. Electronically Signed   By: San Morelle M.D.   On: 01/29/2015 18:28   Mr Brain Wo Contrast 01/29/2015  CLINICAL DATA:  Initial evaluation for acute right-sided weakness, slurred speech. EXAM: MRI HEAD WITHOUT CONTRAST TECHNIQUE: Multiplanar, multiecho pulse sequences of the brain and surrounding structures were obtained without intravenous contrast. COMPARISON:  Prior study from earlier the same day. FINDINGS: Study degraded by motion artifact. Patchy serpiginous areas of acute infarction involving the periventricular and deep white matter of the posterior left frontal lobe (series 3, image 34) as well as the overlying cortical gray matter within the operculum (series 3, image 36). Corresponding signal loss seen on ADC map. Mild involvement of the posterior left insular cortex (series 3, image 30). No associated hemorrhage or mass effect. No  other infarct. Intracranial vascular flow voids preserved. Mild atrophy with chronic small vessel ischemic changes. No mass lesion, midline shift, or mass effect. No hydrocephalus. No extra-axial fluid collection. Craniocervical junction within normal limits. The pituitary gland normal. No acute abnormality about the orbits. Paranasal sinuses are grossly clear. No mastoid effusion. Inner ear structures grossly normal. Bone marrow signal intensity within normal limits. No scalp soft tissue abnormality. IMPRESSION: 1. Patchy small volume acute left MCA territory infarcts involving the posterior left frontal lobe and  left insular cortex. No associated hemorrhage or mass effect. 2. Mild atrophy with chronic small vessel ischemic disease. Electronically Signed   By: Jeannine Boga M.D.   On: 01/29/2015 23:05    12-lead ECG SR, RBBB, LAD All prior EKG's in EPIC reviewed with no documented atrial fibrillation  Telemetry SR  Assessment and Plan:  1. Cryptogenic stroke The patient presents with cryptogenic stroke.  The patient has a TEE planned for this AM.   Given the patient has cardiomyopathy we will prefer to start with a 30 day event monitor and cardiology f/u out patient for further work-up and evaluation.  Discussed with neurology, likely plan at discharge to resume her home ARB, and would be OK to see her in the office and we can get her started on BB therapy in a week or two after discharge. F/U with our service has been arranged for the patient.  Discussed with the patient as well, she is agreeable to this plan.    Renee Dyane Dustman, PA-C 01/30/2015  EP Attending  Agree with above. I did not see the patient prior to discharge. Plan will be as noted above.  Mikle Bosworth.D.

## 2015-01-31 ENCOUNTER — Inpatient Hospital Stay (HOSPITAL_COMMUNITY): Payer: Medicare Other

## 2015-01-31 ENCOUNTER — Encounter (HOSPITAL_COMMUNITY)
Admission: EM | Disposition: A | Discharge status: Home / Self Care | Payer: Self-pay | Source: Home / Self Care | Attending: Internal Medicine

## 2015-01-31 ENCOUNTER — Encounter (HOSPITAL_COMMUNITY): Payer: Self-pay

## 2015-01-31 DIAGNOSIS — M4802 Spinal stenosis, cervical region: Secondary | ICD-10-CM | POA: Diagnosis present

## 2015-01-31 DIAGNOSIS — G8191 Hemiplegia, unspecified affecting right dominant side: Secondary | ICD-10-CM | POA: Diagnosis present

## 2015-01-31 DIAGNOSIS — I081 Rheumatic disorders of both mitral and tricuspid valves: Secondary | ICD-10-CM | POA: Diagnosis present

## 2015-01-31 DIAGNOSIS — I340 Nonrheumatic mitral (valve) insufficiency: Secondary | ICD-10-CM | POA: Diagnosis not present

## 2015-01-31 DIAGNOSIS — R29898 Other symptoms and signs involving the musculoskeletal system: Secondary | ICD-10-CM | POA: Diagnosis not present

## 2015-01-31 DIAGNOSIS — E1165 Type 2 diabetes mellitus with hyperglycemia: Secondary | ICD-10-CM | POA: Diagnosis not present

## 2015-01-31 DIAGNOSIS — I313 Pericardial effusion (noninflammatory): Secondary | ICD-10-CM | POA: Diagnosis not present

## 2015-01-31 DIAGNOSIS — Z7982 Long term (current) use of aspirin: Secondary | ICD-10-CM | POA: Diagnosis not present

## 2015-01-31 DIAGNOSIS — F419 Anxiety disorder, unspecified: Secondary | ICD-10-CM | POA: Diagnosis present

## 2015-01-31 DIAGNOSIS — Z794 Long term (current) use of insulin: Secondary | ICD-10-CM | POA: Diagnosis not present

## 2015-01-31 DIAGNOSIS — F17210 Nicotine dependence, cigarettes, uncomplicated: Secondary | ICD-10-CM | POA: Diagnosis present

## 2015-01-31 DIAGNOSIS — F411 Generalized anxiety disorder: Secondary | ICD-10-CM | POA: Diagnosis present

## 2015-01-31 DIAGNOSIS — E785 Hyperlipidemia, unspecified: Secondary | ICD-10-CM | POA: Diagnosis present

## 2015-01-31 DIAGNOSIS — I639 Cerebral infarction, unspecified: Secondary | ICD-10-CM | POA: Diagnosis present

## 2015-01-31 DIAGNOSIS — R4701 Aphasia: Secondary | ICD-10-CM | POA: Diagnosis present

## 2015-01-31 DIAGNOSIS — I10 Essential (primary) hypertension: Secondary | ICD-10-CM | POA: Diagnosis present

## 2015-01-31 DIAGNOSIS — I429 Cardiomyopathy, unspecified: Secondary | ICD-10-CM | POA: Diagnosis present

## 2015-01-31 DIAGNOSIS — N179 Acute kidney failure, unspecified: Secondary | ICD-10-CM | POA: Diagnosis present

## 2015-01-31 DIAGNOSIS — F329 Major depressive disorder, single episode, unspecified: Secondary | ICD-10-CM | POA: Diagnosis present

## 2015-01-31 DIAGNOSIS — Z79899 Other long term (current) drug therapy: Secondary | ICD-10-CM | POA: Diagnosis not present

## 2015-01-31 DIAGNOSIS — R197 Diarrhea, unspecified: Secondary | ICD-10-CM | POA: Diagnosis present

## 2015-01-31 DIAGNOSIS — J329 Chronic sinusitis, unspecified: Secondary | ICD-10-CM | POA: Diagnosis present

## 2015-01-31 HISTORY — PX: TEE WITHOUT CARDIOVERSION: SHX5443

## 2015-01-31 LAB — BASIC METABOLIC PANEL
ANION GAP: 8 (ref 5–15)
BUN: 20 mg/dL (ref 6–20)
CALCIUM: 8.9 mg/dL (ref 8.9–10.3)
CHLORIDE: 105 mmol/L (ref 101–111)
CO2: 26 mmol/L (ref 22–32)
Creatinine, Ser: 1.24 mg/dL — ABNORMAL HIGH (ref 0.44–1.00)
GFR calc non Af Amer: 46 mL/min — ABNORMAL LOW (ref >60)
GFR, EST AFRICAN AMERICAN: 54 mL/min — AB (ref >60)
Glucose, Bld: 243 mg/dL — ABNORMAL HIGH (ref 65–99)
Potassium: 4.5 mmol/L (ref 3.5–5.1)
Sodium: 139 mmol/L (ref 135–145)

## 2015-01-31 LAB — GLUCOSE, CAPILLARY
GLUCOSE-CAPILLARY: 87 mg/dL (ref 65–99)
GLUCOSE-CAPILLARY: 204 mg/dL — AB (ref 65–99)
GLUCOSE-CAPILLARY: 197 mg/dL — AB (ref 65–99)

## 2015-01-31 LAB — PROTIME-INR
INR: 1.13 (ref 0.00–1.49)
PROTHROMBIN TIME: 14.7 s (ref 11.6–15.2)

## 2015-01-31 LAB — HEMOGLOBIN A1C
HEMOGLOBIN A1C: 8.4 % — AB (ref 4.8–5.6)
Mean Plasma Glucose: 194 mg/dL

## 2015-01-31 SURGERY — ECHOCARDIOGRAM, TRANSESOPHAGEAL
Anesthesia: Moderate Sedation

## 2015-01-31 MED ORDER — METOPROLOL TARTRATE 12.5 MG HALF TABLET
12.5000 mg | ORAL_TABLET | Freq: Two times a day (BID) | ORAL | Status: DC
  Administered 2015-01-31: 12.5 mg via ORAL
  Filled 2015-01-31: qty 1

## 2015-01-31 MED ORDER — METOPROLOL TARTRATE 25 MG PO TABS: 12.5000 mg | ORAL_TABLET | Freq: Two times a day (BID) | ORAL | Status: DC

## 2015-01-31 MED ORDER — ASPIRIN 325 MG PO TABS
325.0000 mg | ORAL_TABLET | Freq: Every day | ORAL | Status: DC
Start: 2015-01-31 — End: 2015-01-31
  Administered 2015-01-31: 325 mg via ORAL
  Filled 2015-01-31: qty 1

## 2015-01-31 MED ORDER — FENTANYL CITRATE (PF) 100 MCG/2ML IJ SOLN
INTRAMUSCULAR | Status: DC | PRN
  Administered 2015-01-31: 25 ug via INTRAVENOUS

## 2015-01-31 MED ORDER — BUTAMBEN-TETRACAINE-BENZOCAINE 2-2-14 % EX AERO
INHALATION_SPRAY | CUTANEOUS | Status: DC | PRN
  Administered 2015-01-31: 2 via TOPICAL

## 2015-01-31 MED ORDER — MIDAZOLAM HCL 5 MG/ML IJ SOLN
INTRAMUSCULAR | Status: AC
  Filled 2015-01-31: qty 2

## 2015-01-31 MED ORDER — MIDAZOLAM HCL 10 MG/2ML IJ SOLN
INTRAMUSCULAR | Status: DC | PRN
  Administered 2015-01-31: 2 mg via INTRAVENOUS
  Administered 2015-01-31: 1 mg via INTRAVENOUS

## 2015-01-31 MED ORDER — FENTANYL CITRATE (PF) 100 MCG/2ML IJ SOLN
INTRAMUSCULAR | Status: AC
  Filled 2015-01-31: qty 2

## 2015-01-31 MED ORDER — LISINOPRIL 2.5 MG PO TABS: 2.5000 mg | ORAL_TABLET | Freq: Every day | ORAL | Status: DC

## 2015-01-31 MED ORDER — INSULIN GLARGINE 300 UNIT/ML ~~LOC~~ SOPN: 45.0000 [IU] | PEN_INJECTOR | Freq: Every evening | SUBCUTANEOUS | Status: DC

## 2015-01-31 MED ORDER — ASPIRIN 325 MG PO TABS: 325.0000 mg | ORAL_TABLET | Freq: Every day | ORAL | Status: DC

## 2015-01-31 MED ORDER — INSULIN GLARGINE 300 UNIT/ML ~~LOC~~ SOPN: 40.0000 [IU] | PEN_INJECTOR | Freq: Every evening | SUBCUTANEOUS | Status: DC

## 2015-01-31 NOTE — CV Procedure (Signed)
See full TEE report in [employer]; severe LV dysfunction (EF 25-30); mild to moderate MR; mild TR; trace AI;  negative saline microcavitation study. Kirk Ruths

## 2015-01-31 NOTE — Progress Notes (Signed)
  Echocardiogram Echocardiogram Transesophageal has been performed.  Jocelyn Wells 01/31/2015, 10:00 AM

## 2015-01-31 NOTE — Progress Notes (Signed)
Inpatient Diabetes Program Recommendations  AACE/ADA: New Consensus Statement on Inpatient Glycemic Control (2015)  Target Ranges:  Prepandial:   less than 140 mg/dL      Peak postprandial:   less than 180 mg/dL (1-2 hours)      Critically ill patients:  140 - 180 mg/dL   Review of Glycemic Control  Outpatient Diabetes medications: Toujeo 60 units Current orders for Inpatient glycemic control: Novolog sens TID  Inpatient Diabetes Program Recommendations:  Insulin - Basal: Add Lantus 20 units  Thank you  Raoul Pitch BSN, RN,CDE Inpatient Diabetes Coordinator [phone] (team pager)

## 2015-01-31 NOTE — Progress Notes (Signed)
Discharged home via wheelchair, EMMI information given. Pt with family, to go home accompanied by RN

## 2015-01-31 NOTE — Care Management Note (Signed)
Case Management Note  Patient Details  Name: Jocelyn Wells MRN: [id number] Date of Birth: Oct 26, 1955  Subjective/Objective:                    Action/Plan: Plan at discharge is to have outpatient speech therapy. CM spoke to the patient and she is interested in outpatient speech therapy at [facility]. CM spoke with [place] and patient will need to call next week and set up an appointment. Dr Waldron [facility] to give her a prescription for outpatient speech at discharge.   Expected Discharge Date:                  Expected Discharge Plan:  Home/Self Care  In-House Referral:     Discharge planning Services  CM Consult  Post Acute Care Choice:    Choice offered to:     DME Arranged:    DME Agency:     HH Arranged:    [place] Agency:     Status of Service:  Completed, signed off  Medicare Important Message Given:    Date Medicare IM Given:    Medicare IM give by:    Date Additional Medicare IM Given:    Additional Medicare Important Message give by:     If discussed at [place] of Stay Meetings, dates discussed:    Additional Comments:  Pollie Friar, RN 01/31/2015, 3:36 PM

## 2015-01-31 NOTE — Interval H&P Note (Signed)
History and Physical Interval Note:  01/31/2015 8:48 AM  Jocelyn Wells  has presented today for surgery, with the diagnosis of STROKE  The various methods of treatment have been discussed with the patient and family. After consideration of risks, benefits and other options for treatment, the patient has consented to  Procedure(s): TRANSESOPHAGEAL ECHOCARDIOGRAM (TEE) (N/A) as a surgical intervention .  The patient's history has been reviewed, patient examined, no change in status, stable for surgery.  I have reviewed the patient's chart and labs.  Questions were answered to the patient's satisfaction.     Kirk Ruths

## 2015-01-31 NOTE — H&P (View-Only) (Signed)
Patient Demographics  Jocelyn Wells, is a 59 y.o. female, DOB - [birth year], ZC:[id number]  Admit date - 01/29/2015   Admitting Physician Elmarie Shiley, MD  Outpatient Primary MD for the patient is Vernie Murders, PA  LOS - 1   Chief Complaint  Patient presents with  . Code Stroke       Admission HPI/Brief narrative: 59 y.o. female with PMH significant for Depression, Diabetes, HTN, MDD, who presents with sudden onset of right hand weakness, right side Numbness, and aphasia, MRI significant for left MCA or territory infarcts.  Subjective:   Jocelyn Wells today has, No headache, No chest pain, No abdominal pain , No Cough - SOB.   Assessment & Plan    Active Problems:   H/O diabetes mellitus   H/O: HTN (hypertension)   Anxiety, generalized   Diabetes mellitus type 2, uncontrolled (HCC)   Nausea & vomiting   AKI (acute kidney injury) ([place])   CVA (cerebral infarction)   Stroke (cerebrum) (HCC)  Acute ischemic CVA - MRI brain: Left MCA territory infarct(posterior left frontal lobe, left insular cortex) - CTA head and neck: High-grade stenosis left MCA, no significant neck stenosis - 2-D echo: Pending - Most likely embolic source, neurology consult greatly appreciated, they arranged for TEE in a.m. with [facility], / possible loop recorder if no embolic source could be identified with TEE - LDL is 89, Lipitor at home - Glycohemoglobin A1c pending - Started on aspirin 325 mg oral daily - PT/OT/SLP consulted  Hypertension - Allow for permissive hypertension  Diabetes mellitus - Hold oral hypoglycemic medication - Continue with insulin sliding scale - Follow on hemoglobin A1c  Hyperlipidemia - LDL is 89, patient is on Lipitor 10 mg oral at home   Code Status: Full  Family Communication: Discussed with family at bedside  Disposition Plan: Pending further workup   Procedures    None    Consults   Neurology   Medications  Scheduled Meds: . atorvastatin  10 mg Oral q1800  . clonazePAM  0.5 mg Oral QHS  . enoxaparin (LOVENOX) injection  40 mg Subcutaneous Q24H  . insulin aspart  0-9 Units Subcutaneous TID WC  . PARoxetine  40 mg Oral Daily   Continuous Infusions: . sodium chloride 100 mL/hr at 01/30/15 0800   PRN Meds:.senna-docusate  DVT Prophylaxis  Lovenox   Lab Results  Component Value Date   PLT 191 01/29/2015    Antibiotics    Anti-infectives    None          Objective:   Filed Vitals:   01/30/15 0430 01/30/15 0630 01/30/15 0938 01/30/15 1355  BP: 157/85 146/88 139/73 137/75  Pulse: 84 85 85 77  Temp: 98 F (36.7 C) 97.8 F (36.6 C) 98.6 F (37 C) 98.3 F (36.8 C)  TempSrc: Axillary Oral Oral Oral  Resp: 18 18 15 17   Height:      Weight:      SpO2: 92% 95% 93% 93%    Wt Readings from Last 3 Encounters:  01/29/15 93.985 kg (207 lb 3.2 oz)  01/21/15 93.895 kg (207 lb)  01/21/15 94.439 kg (208 lb 3.2 oz)     Intake/Output Summary (Last 24 hours) at 01/30/15 1546 Last data filed  at 01/30/15 1200  Gross per 24 hour  Intake    960 ml  Output      0 ml  Net    960 ml     Physical Exam  Awake Alert, Oriented X 3,  Supple Neck,No JVD, No cervical lymphadenopathy appriciated.  Symmetrical Chest wall movement, Good air movement bilaterally, CTAB RRR,No Gallops,Rubs or new Murmurs, No Parasternal Heave +ve B.Sounds, Abd Soft, No tenderness, No organomegaly appriciated, No rebound - guarding or rigidity. No Cyanosis, Clubbing or edema, No new Rash or bruise     Data Review   Micro Results No results found for this or any previous visit (from the past 240 hour(s)).  Radiology Reports Ct Angio Head W/cm &/or Wo Cm  01/29/2015  CLINICAL DATA:  Slurred speech.  Right sensory deficit. EXAM: CT ANGIOGRAPHY HEAD AND NECK TECHNIQUE: Multidetector CT imaging of the head and neck was performed using the standard  protocol during bolus administration of intravenous contrast. Multiplanar CT image reconstructions and MIPs were obtained to evaluate the vascular anatomy. Carotid stenosis measurements (when applicable) are obtained utilizing NASCET criteria, using the distal internal carotid diameter as the denominator. CONTRAST:  27mL OMNIPAQUE IOHEXOL 350 MG/ML SOLN COMPARISON:  CT head without contrast 01/29/2015. FINDINGS: CT HEAD Brain: The source images demonstrate no acute infarct. The basal ganglia are intact. The insular ribbon is intact. The brainstem and cerebellum is unremarkable. The ventricles are of normal size. No significant extra-axial fluid collection is present. Calvarium and skull base: Within normal limits. Paranasal sinuses: Clear Orbits: The globes and orbits are intact. CTA NECK Aortic arch: A 3 vessel arch configuration is present. Minimal calcification is present at the origin of the left subclavian artery relative to the more distal vessel. Right carotid system: The right common carotid artery is within normal limits. Calcifications are present at the right carotid bifurcation without a significant stenosis. There is moderate tortuosity in the midcervical right ICA without a significant stenosis of greater than 50% relative to the more distal vessel. Left carotid system: The left common carotid artery is within normal limits. Atherosclerotic changes are present at the carotid bifurcation without a significant stenosis. Moderate tortuosity is present in the mid cervical left ICA without a significant stenosis of greater than 50% relative to the more distal vessel. Vertebral arteries:The vertebral arteries are codominant. Both vertebral arteries originate from the subclavian arteries without significant stenoses. There are no significant stenoses or focal injuries within the cervical vertebral arteries. Skeleton: No focal lytic or blastic lesions are present. The patient is edentulous. The cervical spine  is unremarkable. Other neck: No focal mucosal or submucosal lesions present. The vocal cords are midline and symmetric. Thyroid is within normal limits. The salivary glands are unremarkable. There is no significant adenopathy. Minimal ground-glass attenuation at the lung apices likely reflects atelectasis. CTA HEAD Anterior circulation: Minimal atherosclerotic calcifications are present in the cavernous internal carotid arteries bilaterally without a significant stenosis. The A1 and M1 segments are normal. The anterior communicating artery is patent. The MCA bifurcations are intact. There is a high-grade stenosis in the posterior inferior left M3 branch vessel. Other distal small vessel disease is present bilaterally. There is moderate attenuation of distal ACA branch vessels bilaterally as well. Posterior circulation: The vertebral arteries are codominant. The PICA origins are visualized and normal. The basilar artery is within normal limits. Both posterior cerebral arteries originate from the basilar tip. The left posterior communicating artery contributes. There is some attenuation of distal PCA  branch vessels. Venous sinuses: The dural sinuses are patent. The left transverse sinus is dominant. The straight sinus and deep cerebral veins are intact. Cortical veins are within normal limits. Anatomic variants: Non Delayed phase: The postcontrast images demonstrate no pathologic enhancement. IMPRESSION: 1. High-grade stenosis of the posterior left MCA in three-vessel potentially corresponding with the patient's symptoms. 2. No focal cortical infarct is evident. 3. Mild diffuse small vessel disease. 4. Moderate tortuosity of the cervical internal carotid arteries bilaterally without significant stenosis in the neck. Electronically Signed   By: San Morelle M.D.   On: 01/29/2015 18:28   Dg Chest 2 View  01/29/2015  CLINICAL DATA:  CVA. EXAM: CHEST  2 VIEW COMPARISON:  03/27/2012 FINDINGS: The heart  borderline enlarged but stable. The mediastinal and hilar contours are within normal limits and unchanged. The lungs are clear of acute process. No pleural effusion. The bony thorax is intact. IMPRESSION: No acute cardiopulmonary findings.  Borderline cardiac enlargement. Electronically Signed   By: Marijo Sanes M.D.   On: 01/29/2015 21:50   Ct Head Wo Contrast  01/29/2015  ADDENDUM REPORT: 01/29/2015 16:59 ADDENDUM: These results were called by telephone at the time of interpretation on 01/29/2015 at 4:55 pm to Dr. Leonel Ramsay, who verbally acknowledged these results. Electronically Signed   By: Nelson Chimes M.D.   On: 01/29/2015 16:59  01/29/2015  CLINICAL DATA:  Code stroke. Slurred speech. Numbness of the right fingers. Last seen normal 3 hours ago. EXAM: CT HEAD WITHOUT CONTRAST TECHNIQUE: Contiguous axial images were obtained from the base of the skull through the vertex without intravenous contrast. COMPARISON:  None. FINDINGS: The brain has a normal appearance without evidence of malformation, atrophy, old or acute infarction, mass lesion, hemorrhage, hydrocephalus or extra-axial collection. The calvarium is unremarkable. The paranasal sinuses, middle ears and mastoids are clear. IMPRESSION: Normal head CT I am in the process of calling this report at this time. Electronically Signed: By: Nelson Chimes M.D. On: 01/29/2015 16:56   Ct Angio Neck W/cm &/or Wo/cm  01/29/2015  CLINICAL DATA:  Slurred speech.  Right sensory deficit. EXAM: CT ANGIOGRAPHY HEAD AND NECK TECHNIQUE: Multidetector CT imaging of the head and neck was performed using the standard protocol during bolus administration of intravenous contrast. Multiplanar CT image reconstructions and MIPs were obtained to evaluate the vascular anatomy. Carotid stenosis measurements (when applicable) are obtained utilizing NASCET criteria, using the distal internal carotid diameter as the denominator. CONTRAST:  20mL OMNIPAQUE IOHEXOL 350 MG/ML SOLN  COMPARISON:  CT head without contrast 01/29/2015. FINDINGS: CT HEAD Brain: The source images demonstrate no acute infarct. The basal ganglia are intact. The insular ribbon is intact. The brainstem and cerebellum is unremarkable. The ventricles are of normal size. No significant extra-axial fluid collection is present. Calvarium and skull base: Within normal limits. Paranasal sinuses: Clear Orbits: The globes and orbits are intact. CTA NECK Aortic arch: A 3 vessel arch configuration is present. Minimal calcification is present at the origin of the left subclavian artery relative to the more distal vessel. Right carotid system: The right common carotid artery is within normal limits. Calcifications are present at the right carotid bifurcation without a significant stenosis. There is moderate tortuosity in the midcervical right ICA without a significant stenosis of greater than 50% relative to the more distal vessel. Left carotid system: The left common carotid artery is within normal limits. Atherosclerotic changes are present at the carotid bifurcation without a significant stenosis. Moderate tortuosity is present in the mid  cervical left ICA without a significant stenosis of greater than 50% relative to the more distal vessel. Vertebral arteries:The vertebral arteries are codominant. Both vertebral arteries originate from the subclavian arteries without significant stenoses. There are no significant stenoses or focal injuries within the cervical vertebral arteries. Skeleton: No focal lytic or blastic lesions are present. The patient is edentulous. The cervical spine is unremarkable. Other neck: No focal mucosal or submucosal lesions present. The vocal cords are midline and symmetric. Thyroid is within normal limits. The salivary glands are unremarkable. There is no significant adenopathy. Minimal ground-glass attenuation at the lung apices likely reflects atelectasis. CTA HEAD Anterior circulation: Minimal  atherosclerotic calcifications are present in the cavernous internal carotid arteries bilaterally without a significant stenosis. The A1 and M1 segments are normal. The anterior communicating artery is patent. The MCA bifurcations are intact. There is a high-grade stenosis in the posterior inferior left M3 branch vessel. Other distal small vessel disease is present bilaterally. There is moderate attenuation of distal ACA branch vessels bilaterally as well. Posterior circulation: The vertebral arteries are codominant. The PICA origins are visualized and normal. The basilar artery is within normal limits. Both posterior cerebral arteries originate from the basilar tip. The left posterior communicating artery contributes. There is some attenuation of distal PCA branch vessels. Venous sinuses: The dural sinuses are patent. The left transverse sinus is dominant. The straight sinus and deep cerebral veins are intact. Cortical veins are within normal limits. Anatomic variants: Non Delayed phase: The postcontrast images demonstrate no pathologic enhancement. IMPRESSION: 1. High-grade stenosis of the posterior left MCA in three-vessel potentially corresponding with the patient's symptoms. 2. No focal cortical infarct is evident. 3. Mild diffuse small vessel disease. 4. Moderate tortuosity of the cervical internal carotid arteries bilaterally without significant stenosis in the neck. Electronically Signed   By: San Morelle M.D.   On: 01/29/2015 18:28   Mr Brain Wo Contrast  01/29/2015  CLINICAL DATA:  Initial evaluation for acute right-sided weakness, slurred speech. EXAM: MRI HEAD WITHOUT CONTRAST TECHNIQUE: Multiplanar, multiecho pulse sequences of the brain and surrounding structures were obtained without intravenous contrast. COMPARISON:  Prior study from earlier the same day. FINDINGS: Study degraded by motion artifact. Patchy serpiginous areas of acute infarction involving the periventricular and deep white  matter of the posterior left frontal lobe (series 3, image 34) as well as the overlying cortical gray matter within the operculum (series 3, image 36). Corresponding signal loss seen on ADC map. Mild involvement of the posterior left insular cortex (series 3, image 30). No associated hemorrhage or mass effect. No other infarct. Intracranial vascular flow voids preserved. Mild atrophy with chronic small vessel ischemic changes. No mass lesion, midline shift, or mass effect. No hydrocephalus. No extra-axial fluid collection. Craniocervical junction within normal limits. The pituitary gland normal. No acute abnormality about the orbits. Paranasal sinuses are grossly clear. No mastoid effusion. Inner ear structures grossly normal. Bone marrow signal intensity within normal limits. No scalp soft tissue abnormality. IMPRESSION: 1. Patchy small volume acute left MCA territory infarcts involving the posterior left frontal lobe and left insular cortex. No associated hemorrhage or mass effect. 2. Mild atrophy with chronic small vessel ischemic disease. Electronically Signed   By: Jeannine Boga M.D.   On: 01/29/2015 23:05     CBC  Recent Labs Lab 01/29/15 1635 01/29/15 1641  WBC 10.6*  --   HGB 11.9* 12.6  HCT 37.3 37.0  PLT 191  --   MCV 87.4  --  MCH 27.9  --   MCHC 31.9  --   RDW 13.7  --   LYMPHSABS 2.6  --   MONOABS 0.5  --   EOSABS 0.1  --   BASOSABS 0.0  --     Chemistries   Recent Labs Lab 01/29/15 1635 01/29/15 1641  NA 139 139  K 4.8 4.8  CL 105 105  CO2 24  --   GLUCOSE 211* 205*  BUN 22* 28*  CREATININE 1.33* 1.10*  CALCIUM 9.2  --   AST 17  --   ALT 17  --   ALKPHOS 82  --   BILITOT 0.5  --    ------------------------------------------------------------------------------------------------------------------ estimated creatinine clearance is 62.8 mL/min (by C-G formula based on Cr of  1.1). ------------------------------------------------------------------------------------------------------------------ No results for input(s): HGBA1C in the last 72 hours. ------------------------------------------------------------------------------------------------------------------  Recent Labs  01/30/15 0355  CHOL 158  HDL 33*  LDLCALC 89  TRIG 182*  CHOLHDL 4.8   ------------------------------------------------------------------------------------------------------------------ No results for input(s): TSH, T4TOTAL, T3FREE, THYROIDAB in the last 72 hours.  Invalid input(s): FREET3 ------------------------------------------------------------------------------------------------------------------ No results for input(s): VITAMINB12, FOLATE, FERRITIN, TIBC, IRON, RETICCTPCT in the last 72 hours.  Coagulation profile  Recent Labs Lab 01/29/15 1635  INR 1.05    No results for input(s): DDIMER in the last 72 hours.  Cardiac Enzymes No results for input(s): CKMB, TROPONINI, MYOGLOBIN in the last 168 hours.  Invalid input(s): CK ------------------------------------------------------------------------------------------------------------------ Invalid input(s): POCBNP     Time Spent in minutes   30 minutes   Courtnie Brenes M.D on 01/30/2015 at 3:46 PM  Between 7am to 7pm - Pager - [phone]  After 7pm go to [email or web address] - password [email or web address]  [facility]   Office  [phone]

## 2015-01-31 NOTE — Discharge Summary (Signed)
Jocelyn Wells, is a 59 y.o. female  DOB January 11, 1956  MRN [id number].  Admission date:  01/29/2015  Admitting Physician  Elmarie Shiley, MD  Discharge Date:  01/31/2015   Primary MD  Vernie Murders, PA  Recommendations for primary care physician for things to follow:  - Patient to follow-up as an outpatient with cardiology regarding cardiomyopathy with EF 25%. - Check CBC, BMP during next visit. - Follow with neurology as an outpatient in 2 month.  Admission Diagnosis  Aphasia [R47.01] Stroke (cerebrum) ([place]) [I63.9] Cerebral infarction due to unspecified mechanism [I63.9]   Discharge Diagnosis  Aphasia [R47.01] Stroke (cerebrum) ([place]) [I63.9] Cerebral infarction due to unspecified mechanism [I63.9]   Active Problems:   H/O diabetes mellitus   H/O: HTN (hypertension)   Anxiety, generalized   Diabetes mellitus type 2, uncontrolled (H[facility])   Nausea & vomiting   AKI (acute kidney injury) ([place])   CVA (cerebral infarction)   Stroke (cerebrum) ([place])   Stroke [facility])      Past Medical History  Diagnosis Date  . Anxiety   . Depression   . Diabetes mellitus, type II ([place])   . PONV (postoperative nausea and vomiting)     Past Surgical History  Procedure Laterality Date  . Abdominal hysterectomy    . Tubal ligation  1978  . Cesarean section  1978  . Knee arthroscopy Left 2005  . Ulnar nerve transposition  2008  . Bilateral salpingoophorectomy  2000  . Ankle fracture surgery Left 2002       History of present illness and  Hospital Course:     Kindly see H&P for history of present illness and admission details, please review complete Labs, Consult reports and Test reports for all details in brief  HPI  from the history and physical done on the day of admission 01/29/2015  Jocelyn Wells is a 59 y.o. female with PMH significant for Depression, Diabetes, HTN, MDD, who presents with  sudden onset of right hand weakness, right side Numbness, and mild world finding difficulty, symptoms started at 2 PM. She presents to ED as CODE stroke. She was evaluated by neurology. TPA was not administered due to mild symptoms. She relates that her symptoms are stable, symptoms are not worse. She was able to repeat multiples words. She continue to have some difficulty finding words and completing sentences. She report mild loose stool. She is on antibiotics For sinusitis. She denies abdominal pain. She vomited twice in the ED, food content.    Evaluation in the ED: CT head negative, Cr at 1.3, troponin 0.01, CBG 205.   Hospital Course  59 y.o. female with PMH significant for Depression, Diabetes, HTN, MDD, who presents with sudden onset of right hand weakness, right side Numbness, and aphasia, MRI significant for left MCA or territory infarcts.  Acute ischemic CVA - MRI brain: Left MCA territory infarct(posterior left frontal lobe, left insular cortex) - CTA head and neck: High-grade stenosis left MCA, no significant neck stenosis - 2-D echo: - Carotid  Doppler:Acute ischemic CVA - MRI brain: Left MCA territory infarct(posterior left frontal lobe, left insular cortex) - CTA head and neck: High-grade stenosis left MCA, no significant neck stenosis - 2-D echo: Pending - High suspicion ion for embolic source, a TEE was performed no evidence of embolic source, but TEE significant for cardiac neuropathy was EF 25%, cardiology will arrange for 30 days heart monitor as an outpatient. - LDL is 89, Lipitor at home - Glycohemoglobin A1c pending - Started on aspirin 325 mg oral daily - Patient SLP  Hypertension - Resume home meds including losartan, started on low dose metoprolol giving her cardiomyopathy with EF 25%.  Cardiomyopathy with EF 25-30%. - Follow with cardiology as an outpatient, continue with losartan, started on metoprolol prior to discharge, and consider Aldactone as an  outpatient.  Diabetes mellitus - Follow on hemoglobin A1c 8.4, last year 13.3. - Resume oral hypoglycemic agents on discharge, large and dose has been decreased from 60 units to 40 units on discharge, given her CBGs were generated acceptable during hospital stay.  Hyperlipidemia - LDL is 89, patient is on Lipitor 10 mg oral at home        Discharge Condition:  stable   Follow UP  Follow-up Information    Follow up with Baldwin [place], PA-C On 02/18/2015.   Specialty:  Cardiology   Why:  9:00AM   Contact information:   [address] [place] [address] [phone]       Follow up with Antony Contras, MD.   Specialties:  Neurology, Radiology   Why:  Stroke Clinic, Office will call you with appointment date & time   Contact information:   [address] [place] [address] [phone]       Follow up with Vernie Murders, [place]. Schedule an appointment as soon as possible for a visit in 1 week.   Specialty:  Family Medicine   Why:  Posthospitalization follow-up   Contact information:   [address] [place] [address] [phone]         Discharge Instructions  and  Discharge Medications     Discharge Instructions    Ambulatory referral to Neurology    Complete by:  As directed   Please schedule post stroke follow up in 2 months.            Medication List    STOP taking these medications        amoxicillin-clavulanate 875-125 MG tablet  Commonly known as:  AUGMENTIN     diclofenac 75 MG EC tablet  Commonly known as:  VOLTAREN      TAKE these medications        aspirin 325 MG tablet  Take 1 tablet (325 mg total) by mouth daily.     atorvastatin 10 MG tablet  Commonly known as:  LIPITOR  Take 1 tablet (10 mg total) by mouth daily.     benzonatate 100 MG capsule  Commonly known as:  TESSALON  TAKE ONE CAPSULE BY MOUTH EVERY NIGHT AT BEDTIME     clonazePAM 0.5 MG tablet  Commonly known as:  KLONOPIN    Take 1 tablet (0.5 mg total) by mouth at bedtime.     glyBURIDE-metformin 5-500 MG tablet  Commonly known as:  GLUCOVANCE  Take 1 tablet by mouth 2 (two) times daily.     Insulin Glargine 300 UNIT/ML Sopn  Commonly known as:  TOUJEO SOLOSTAR  Inject 45 Units into the skin every evening.  loratadine 10 MG tablet  Commonly known as:  CLARITIN  Take 1 tablet by mouth daily.     losartan 50 MG tablet  Commonly known as:  COZAAR  TAKE 1 TABLET BY MOUTH EVERY DAY     metoprolol tartrate 25 MG tablet  Commonly known as:  LOPRESSOR  Take 0.5 tablets (12.5 mg total) by mouth 2 (two) times daily.     ONE TOUCH ULTRA TEST test strip  Generic drug:  glucose blood     ONETOUCH DELICA LANCETS FINE Misc     OTEZLA 30 MG Tabs  Generic drug:  Apremilast  Take 30 mg by mouth 2 (two) times daily.     PARoxetine 40 MG tablet  Commonly known as:  PAXIL  Take 1 tablet (40 mg total) by mouth daily.     zolpidem 5 MG tablet  Commonly known as:  AMBIEN  Take 5 mg by mouth at bedtime.          Diet and Activity recommendation: See Discharge Instructions above   Consults obtained -  Neurology Cardiology   Major procedures and Radiology Reports - PLEASE review detailed and final reports for all details, in brief -   TEE 12/23   Ct Angio Head W/cm &/or Wo Cm  01/29/2015  CLINICAL DATA:  Slurred speech.  Right sensory deficit. EXAM: CT ANGIOGRAPHY HEAD AND NECK TECHNIQUE: Multidetector CT imaging of the head and neck was performed using the standard protocol during bolus administration of intravenous contrast. Multiplanar CT image reconstructions and MIPs were obtained to evaluate the vascular anatomy. Carotid stenosis measurements (when applicable) are obtained utilizing NASCET criteria, using the distal internal carotid diameter as the denominator. CONTRAST:  36mL OMNIPAQUE IOHEXOL 350 MG/ML SOLN COMPARISON:  CT head without contrast 01/29/2015. FINDINGS: CT HEAD Brain: The source  images demonstrate no acute infarct. The basal ganglia are intact. The insular ribbon is intact. The brainstem and cerebellum is unremarkable. The ventricles are of normal size. No significant extra-axial fluid collection is present. Calvarium and skull base: Within normal limits. Paranasal sinuses: Clear Orbits: The globes and orbits are intact. CTA NECK Aortic arch: A 3 vessel arch configuration is present. Minimal calcification is present at the origin of the left subclavian artery relative to the more distal vessel. Right carotid system: The right common carotid artery is within normal limits. Calcifications are present at the right carotid bifurcation without a significant stenosis. There is moderate tortuosity in the midcervical right ICA without a significant stenosis of greater than 50% relative to the more distal vessel. Left carotid system: The left common carotid artery is within normal limits. Atherosclerotic changes are present at the carotid bifurcation without a significant stenosis. Moderate tortuosity is present in the mid cervical left ICA without a significant stenosis of greater than 50% relative to the more distal vessel. Vertebral arteries:The vertebral arteries are codominant. Both vertebral arteries originate from the subclavian arteries without significant stenoses. There are no significant stenoses or focal injuries within the cervical vertebral arteries. Skeleton: No focal lytic or blastic lesions are present. The patient is edentulous. The cervical spine is unremarkable. Other neck: No focal mucosal or submucosal lesions present. The vocal cords are midline and symmetric. Thyroid is within normal limits. The salivary glands are unremarkable. There is no significant adenopathy. Minimal ground-glass attenuation at the lung apices likely reflects atelectasis. CTA HEAD Anterior circulation: Minimal atherosclerotic calcifications are present in the cavernous internal carotid arteries  bilaterally without a significant stenosis. The A1 and  M1 segments are normal. The anterior communicating artery is patent. The MCA bifurcations are intact. There is a high-grade stenosis in the posterior inferior left M3 branch vessel. Other distal small vessel disease is present bilaterally. There is moderate attenuation of distal ACA branch vessels bilaterally as well. Posterior circulation: The vertebral arteries are codominant. The PICA origins are visualized and normal. The basilar artery is within normal limits. Both posterior cerebral arteries originate from the basilar tip. The left posterior communicating artery contributes. There is some attenuation of distal PCA branch vessels. Venous sinuses: The dural sinuses are patent. The left transverse sinus is dominant. The straight sinus and deep cerebral veins are intact. Cortical veins are within normal limits. Anatomic variants: Non Delayed phase: The postcontrast images demonstrate no pathologic enhancement. IMPRESSION: 1. High-grade stenosis of the posterior left MCA in three-vessel potentially corresponding with the patient's symptoms. 2. No focal cortical infarct is evident. 3. Mild diffuse small vessel disease. 4. Moderate tortuosity of the cervical internal carotid arteries bilaterally without significant stenosis in the neck. Electronically Signed   By: San Morelle M.D.   On: 01/29/2015 18:28   Dg Chest 2 View  01/29/2015  CLINICAL DATA:  CVA. EXAM: CHEST  2 VIEW COMPARISON:  03/27/2012 FINDINGS: The heart borderline enlarged but stable. The mediastinal and hilar contours are within normal limits and unchanged. The lungs are clear of acute process. No pleural effusion. The bony thorax is intact. IMPRESSION: No acute cardiopulmonary findings.  Borderline cardiac enlargement. Electronically Signed   By: Marijo Sanes M.D.   On: 01/29/2015 21:50   Ct Head Wo Contrast  01/29/2015  ADDENDUM REPORT: 01/29/2015 16:59 ADDENDUM: These results  were called by telephone at the time of interpretation on 01/29/2015 at 4:55 pm to Dr. Leonel Ramsay, who verbally acknowledged these results. Electronically Signed   By: Nelson Chimes M.D.   On: 01/29/2015 16:59  01/29/2015  CLINICAL DATA:  Code stroke. Slurred speech. Numbness of the right fingers. Last seen normal 3 hours ago. EXAM: CT HEAD WITHOUT CONTRAST TECHNIQUE: Contiguous axial images were obtained from the base of the skull through the vertex without intravenous contrast. COMPARISON:  None. FINDINGS: The brain has a normal appearance without evidence of malformation, atrophy, old or acute infarction, mass lesion, hemorrhage, hydrocephalus or extra-axial collection. The calvarium is unremarkable. The paranasal sinuses, middle ears and mastoids are clear. IMPRESSION: Normal head CT I am in the process of calling this report at this time. Electronically Signed: By: Nelson Chimes M.D. On: 01/29/2015 16:56   Ct Angio Neck W/cm &/or Wo/cm  01/29/2015  CLINICAL DATA:  Slurred speech.  Right sensory deficit. EXAM: CT ANGIOGRAPHY HEAD AND NECK TECHNIQUE: Multidetector CT imaging of the head and neck was performed using the standard protocol during bolus administration of intravenous contrast. Multiplanar CT image reconstructions and MIPs were obtained to evaluate the vascular anatomy. Carotid stenosis measurements (when applicable) are obtained utilizing NASCET criteria, using the distal internal carotid diameter as the denominator. CONTRAST:  29mL OMNIPAQUE IOHEXOL 350 MG/ML SOLN COMPARISON:  CT head without contrast 01/29/2015. FINDINGS: CT HEAD Brain: The source images demonstrate no acute infarct. The basal ganglia are intact. The insular ribbon is intact. The brainstem and cerebellum is unremarkable. The ventricles are of normal size. No significant extra-axial fluid collection is present. Calvarium and skull base: Within normal limits. Paranasal sinuses: Clear Orbits: The globes and orbits are intact. CTA  NECK Aortic arch: A 3 vessel arch configuration is present. Minimal calcification is present at  the origin of the left subclavian artery relative to the more distal vessel. Right carotid system: The right common carotid artery is within normal limits. Calcifications are present at the right carotid bifurcation without a significant stenosis. There is moderate tortuosity in the midcervical right ICA without a significant stenosis of greater than 50% relative to the more distal vessel. Left carotid system: The left common carotid artery is within normal limits. Atherosclerotic changes are present at the carotid bifurcation without a significant stenosis. Moderate tortuosity is present in the mid cervical left ICA without a significant stenosis of greater than 50% relative to the more distal vessel. Vertebral arteries:The vertebral arteries are codominant. Both vertebral arteries originate from the subclavian arteries without significant stenoses. There are no significant stenoses or focal injuries within the cervical vertebral arteries. Skeleton: No focal lytic or blastic lesions are present. The patient is edentulous. The cervical spine is unremarkable. Other neck: No focal mucosal or submucosal lesions present. The vocal cords are midline and symmetric. Thyroid is within normal limits. The salivary glands are unremarkable. There is no significant adenopathy. Minimal ground-glass attenuation at the lung apices likely reflects atelectasis. CTA HEAD Anterior circulation: Minimal atherosclerotic calcifications are present in the cavernous internal carotid arteries bilaterally without a significant stenosis. The A1 and M1 segments are normal. The anterior communicating artery is patent. The MCA bifurcations are intact. There is a high-grade stenosis in the posterior inferior left M3 branch vessel. Other distal small vessel disease is present bilaterally. There is moderate attenuation of distal ACA branch vessels  bilaterally as well. Posterior circulation: The vertebral arteries are codominant. The PICA origins are visualized and normal. The basilar artery is within normal limits. Both posterior cerebral arteries originate from the basilar tip. The left posterior communicating artery contributes. There is some attenuation of distal PCA branch vessels. Venous sinuses: The dural sinuses are patent. The left transverse sinus is dominant. The straight sinus and deep cerebral veins are intact. Cortical veins are within normal limits. Anatomic variants: Non Delayed phase: The postcontrast images demonstrate no pathologic enhancement. IMPRESSION: 1. High-grade stenosis of the posterior left MCA in three-vessel potentially corresponding with the patient's symptoms. 2. No focal cortical infarct is evident. 3. Mild diffuse small vessel disease. 4. Moderate tortuosity of the cervical internal carotid arteries bilaterally without significant stenosis in the neck. Electronically Signed   By: San Morelle M.D.   On: 01/29/2015 18:28   Mr Brain Wo Contrast  01/29/2015  CLINICAL DATA:  Initial evaluation for acute right-sided weakness, slurred speech. EXAM: MRI HEAD WITHOUT CONTRAST TECHNIQUE: Multiplanar, multiecho pulse sequences of the brain and surrounding structures were obtained without intravenous contrast. COMPARISON:  Prior study from earlier the same day. FINDINGS: Study degraded by motion artifact. Patchy serpiginous areas of acute infarction involving the periventricular and deep white matter of the posterior left frontal lobe (series 3, image 34) as well as the overlying cortical gray matter within the operculum (series 3, image 36). Corresponding signal loss seen on ADC map. Mild involvement of the posterior left insular cortex (series 3, image 30). No associated hemorrhage or mass effect. No other infarct. Intracranial vascular flow voids preserved. Mild atrophy with chronic small vessel ischemic changes. No mass  lesion, midline shift, or mass effect. No hydrocephalus. No extra-axial fluid collection. Craniocervical junction within normal limits. The pituitary gland normal. No acute abnormality about the orbits. Paranasal sinuses are grossly clear. No mastoid effusion. Inner ear structures grossly normal. Bone marrow signal intensity within normal limits. No  scalp soft tissue abnormality. IMPRESSION: 1. Patchy small volume acute left MCA territory infarcts involving the posterior left frontal lobe and left insular cortex. No associated hemorrhage or mass effect. 2. Mild atrophy with chronic small vessel ischemic disease. Electronically Signed   By: Jeannine Boga M.D.   On: 01/29/2015 23:05    Micro Results    No results found for this or any previous visit (from the past 240 hour(s)).     Today   Subjective:   Jocelyn Wells today has no headache,no chest abdominal pain,no new weakness tingling or numbness, feels much better wants to go home today.   Objective:   Blood pressure 152/77, pulse 71, temperature 98.4 F (36.9 C), temperature source Oral, resp. rate 16, height 5\' 6"  (1.676 m), weight 93.985 kg (207 lb 3.2 oz), SpO2 91 %.  No intake or output data in the 24 hours ending 01/31/15 1519  Exam Awake Alert, Oriented x 3,  [place].AT,PERRAL Supple Neck,No JVD, No cervical lymphadenopathy appriciated.  Symmetrical Chest wall movement, Good air movement bilaterally, CTAB RRR,No Gallops,Rubs or new Murmurs, No Parasternal Heave +ve B.Sounds, Abd Soft, Non tender, No organomegaly appriciated, No rebound -guarding or rigidity. No Cyanosis, Clubbing or edema, No new Rash or bruise  Data Review   CBC w Diff:  Lab Results  Component Value Date   WBC 10.6* 01/29/2015   WBC 11.4 03/22/2014   HGB 12.6 01/29/2015   HCT 37.0 01/29/2015   PLT 191 01/29/2015   LYMPHOPCT 24 01/29/2015   MONOPCT 4 01/29/2015   EOSPCT 1 01/29/2015   BASOPCT 0 01/29/2015    CMP:  Lab Results  Component  Value Date   NA 139 01/31/2015   NA 137 03/22/2014   K 4.5 01/31/2015   CL 105 01/31/2015   CO2 26 01/31/2015   BUN 20 01/31/2015   BUN 11 03/22/2014   CREATININE 1.24* 01/31/2015   CREATININE 1.0 03/22/2014   GLU 348 03/22/2014   PROT 6.2* 01/29/2015   ALBUMIN 3.5 01/29/2015   BILITOT 0.5 01/29/2015   ALKPHOS 82 01/29/2015   AST 17 01/29/2015   ALT 17 01/29/2015  .   Total Time in preparing paper work, data evaluation and todays exam - 35 minutes  Gleb Mcguire M.D on 01/31/2015 at 3:19 PM  [facility]   Office  [phone]

## 2015-01-31 NOTE — Progress Notes (Signed)
VASCULAR LAB PRELIMINARY  PRELIMINARY  PRELIMINARY  PRELIMINARY    Carotid duplex completed. Bilateral:  1-39% ICA stenosis.  Vertebral artery flow is antegrade.     Gilles Trimpe, RVT, RDMS 01/31/2015, 3:11 PM

## 2015-01-31 NOTE — Discharge Instructions (Signed)
Follow with Primary MD Jocelyn Murders, PA in 7 days   Get CBC, CMP,checked  by Primary MD next visit.    Activity: As tolerated with Full fall precautions use walker/cane & assistance as needed   Disposition Home    Diet: Heart Healthy, low-salt, carbohydrate modified , with feeding assistance and aspiration precautions.  For Heart failure patients - Check your Weight same time everyday, if you gain over 2 pounds, or you develop in leg swelling, experience more shortness of breath or chest pain, call your Primary MD immediately. Follow Cardiac Low Salt Diet and 1.5 lit/day fluid restriction.   On your next visit with your primary care physician please Get Medicines reviewed and adjusted.   Please request your Prim.MD to go over all Hospital Tests and Procedure/Radiological results at the follow up, please get all Hospital records sent to your Prim MD by signing hospital release before you go home.   If you experience worsening of your admission symptoms, develop shortness of breath, life threatening emergency, suicidal or homicidal thoughts you must seek medical attention immediately by calling 911 or calling your MD immediately  if symptoms less severe.  You Must read complete instructions/literature along with all the possible adverse reactions/side effects for all the Medicines you take and that have been prescribed to you. Take any new Medicines after you have completely understood and accpet all the possible adverse reactions/side effects.   Do not drive, operating heavy machinery, perform activities at heights, swimming or participation in water activities or provide baby sitting services if your were admitted for syncope or siezures until you have seen by Primary MD or a Neurologist and advised to do so again.  Do not drive when taking Pain medications.    Do not take more than prescribed Pain, Sleep and Anxiety Medications  Special Instructions: If you have smoked or chewed  Tobacco  in the last 2 yrs please stop smoking, stop any regular Alcohol  and or any Recreational drug use.  Wear Seat belts while driving.   Please note  You were cared for by a hospitalist during your hospital stay. If you have any questions about your discharge medications or the care you received while you were in the hospital after you are discharged, you can call the unit and asked to speak with the hospitalist on call if the hospitalist that took care of you is not available. Once you are discharged, your primary care physician will handle any further medical issues. Please note that NO REFILLS for any discharge medications will be authorized once you are discharged, as it is imperative that you return to your primary care physician (or establish a relationship with a primary care physician if you do not have one) for your aftercare needs so that they can reassess your need for medications and monitor your lab values.

## 2015-02-01 LAB — GLUCOSE, CAPILLARY: GLUCOSE-CAPILLARY: 192 mg/dL — AB (ref 65–99)

## 2015-02-03 ENCOUNTER — Encounter (HOSPITAL_COMMUNITY): Payer: Self-pay | Admitting: Cardiology

## 2015-02-06 ENCOUNTER — Other Ambulatory Visit: Payer: Self-pay | Admitting: Family Medicine

## 2015-02-06 NOTE — Telephone Encounter (Signed)
Jocelyn Wells patient

## 2015-02-11 ENCOUNTER — Ambulatory Visit (INDEPENDENT_AMBULATORY_CARE_PROVIDER_SITE_OTHER): Payer: Medicare Other | Admitting: Family Medicine

## 2015-02-11 ENCOUNTER — Encounter: Payer: Self-pay | Admitting: Family Medicine

## 2015-02-11 ENCOUNTER — Other Ambulatory Visit: Payer: Self-pay

## 2015-02-11 VITALS — BP 136/82 | HR 62 | Temp 97.8°F | Resp 16 | Wt 208.4 lb

## 2015-02-11 DIAGNOSIS — I639 Cerebral infarction, unspecified: Secondary | ICD-10-CM

## 2015-02-11 NOTE — Progress Notes (Signed)
Patient: Jocelyn Wells Female    DOB: 1955/08/23   60 y.o.   MRN: [id number] Visit Date: 02/11/2015  Today's Provider: Vernie Murders, PA   Chief Complaint  Patient presents with  . Hospitalization Follow-up   Subjective:    HPI  Follow up Hospitalization  Patient was admitted to [facility] on 01/29/2015 and discharged on 01/31/2015. She was treated for stroke. MRI brain: Left MCA territory infarct(posterior left frontal lobe, left insular cortex). CTA head and neck: High-grade stenosis left MCA, no significant neck stenosis.  2-D echo: Severe global reduction in LV function; mild to moderate MR; mildTR; trace AI; small pericardial effusion; negative saline microcavitation study on 01-31-15. Major symptoms were right hand weakness with numbness and aphasia but has cleared. Still feels energy level is low. Treatment for this included starting Aspirin 325 mg and Metoprolol 25 mg. She reports excellent compliance with treatment. She reports this condition is Improved. Patient Active Problem List   Diagnosis Date Noted  . Stroke ([place]) 01/31/2015  . Nausea & vomiting 01/29/2015  . AKI (acute kidney injury) ([place]) 01/29/2015  . CVA (cerebral infarction) 01/29/2015  . Stroke (cerebrum) ([place]) 01/29/2015  . Dizziness and giddiness 07/05/2014  . Blood pressure elevated 07/05/2014  . Carbuncle and furuncle 07/05/2014  . BP (high blood pressure) 07/05/2014  . Does not feel right 07/05/2014  . Pain of perianal area 07/05/2014  . Guttate psoriasis 07/05/2014  . Gonalgia 07/05/2014  . H/O diabetes mellitus 06/05/2014  . CAFL (chronic airflow limitation) ([place]) 06/05/2014  . H/O visual disturbance 06/05/2014  . H/O elevated lipids 06/05/2014  . H/O: HTN (hypertension) 06/05/2014  . H/O: osteoarthritis 06/05/2014  . Initial insomnia 06/05/2014  . Anxiety, generalized 06/05/2014  . Depression, major, recurrent, moderate ([place]) 06/05/2014  . Microalbuminuria 12/23/2013  . Long term current use  of insulin ([place]) 12/23/2013  . Compulsive tobacco user syndrome 12/23/2013  . Contusion of cheek 03/31/2009  . Change in blood platelet count 06/08/2007  . Blood in feces 06/01/2007  . D (diarrhea) 06/01/2007  . Disturbance of skin sensation 09/02/2006  . Difficulty hearing 07/25/2006  . Acute onset aura migraine 06/27/2006  . Cephalalgia 05/27/2006  . Clinical depression 10/22/2005  . Diabetes mellitus type 2, uncontrolled ([place]) 10/22/2005  . HLD (hyperlipidemia) 10/22/2005  . Arthritis, degenerative 10/22/2005  . Current tobacco use 10/22/2005   Past Surgical History  Procedure Laterality Date  . Abdominal hysterectomy    . Tubal ligation  1978  . Cesarean section  1978  . Knee arthroscopy Left 2005  . Ulnar nerve transposition  2008  . Bilateral salpingoophorectomy  2000  . Ankle fracture surgery Left 2002  . Tee without cardioversion N/A 01/31/2015    Procedure: TRANSESOPHAGEAL ECHOCARDIOGRAM (TEE);  Surgeon: Lelon Perla, MD;  Location: [facility] ENDOSCOPY;  Service: Cardiovascular;  Laterality: N/A;   Family History  Problem Relation Age of Onset  . Heart disease Mother     died from CHF  . Asthma Mother   . Diabetes Mother   . Heart disease Father   . Aneurysm Father   . Diabetes Brother   . Alcohol abuse Paternal Aunt    Allergies  Allergen Reactions  . Codeine Nausea And Vomiting     Previous Medications   APREMILAST (OTEZLA) 30 MG TABS    Take 30 mg by mouth 2 (two) times daily.   ASPIRIN 325 MG TABLET    Take 1 tablet (325 mg total) by mouth daily.   ATORVASTATIN (LIPITOR)  10 MG TABLET    Take 1 tablet (10 mg total) by mouth daily.   BENZONATATE (TESSALON) 100 MG CAPSULE    TAKE ONE CAPSULE BY MOUTH EVERY NIGHT AT BEDTIME   CLONAZEPAM (KLONOPIN) 0.5 MG TABLET    Take 1 tablet (0.5 mg total) by mouth at bedtime.   DICLOFENAC (VOLTAREN) 75 MG EC TABLET       GLYBURIDE-METFORMIN (GLUCOVANCE) 5-500 MG PER TABLET    Take 1 tablet by mouth 2 (two) times daily.    INSULIN GLARGINE (TOUJEO SOLOSTAR) 300 UNIT/ML SOPN    Inject 40 Units into the skin every evening.   LORATADINE (CLARITIN) 10 MG TABLET    Take 1 tablet by mouth daily.   LOSARTAN (COZAAR) 50 MG TABLET    TAKE 1 TABLET BY MOUTH EVERY DAY   METOPROLOL TARTRATE (LOPRESSOR) 25 MG TABLET    Take 0.5 tablets (12.5 mg total) by mouth 2 (two) times daily.   ONE TOUCH ULTRA TEST TEST STRIP       ONETOUCH DELICA LANCETS FINE MISC       PAROXETINE (PAXIL) 40 MG TABLET    Take 1 tablet (40 mg total) by mouth daily.   ZOLPIDEM (AMBIEN) 5 MG TABLET    Take 5 mg by mouth at bedtime.    Review of Systems  Constitutional: Positive for fatigue.  HENT: Negative.   Eyes: Negative.   Respiratory: Negative.   Cardiovascular: Negative.   Gastrointestinal: Negative.   Endocrine: Negative.   Genitourinary: Negative.   Musculoskeletal: Negative.   Skin: Negative.   Allergic/Immunologic: Negative.   Neurological: Negative.   Hematological: Negative.   Psychiatric/Behavioral: Negative.     Social History  Substance Use Topics  . Smoking status: Current Every Day Smoker -- 1.00 packs/day for 35 years    Types: Cigarettes    Start date: 02/08/1974  . Smokeless tobacco: Never Used  . Alcohol Use: No   Objective:   BP 136/82 mmHg  Pulse 62  Temp(Src) 97.8 F (36.6 C) (Oral)  Resp 16  Wt 208 lb 6.4 oz (94.53 kg)  Physical Exam  Constitutional: She is oriented to person, place, and time. She appears well-developed and well-nourished.  HENT:  Head: Normocephalic.  Right Ear: External ear normal.  Left Ear: External ear normal.  Nose: Nose normal.  Mouth/Throat: Oropharynx is clear and moist.  Eyes: Conjunctivae and EOM are normal. Pupils are equal, round, and reactive to light.  Neck: Neck supple. No JVD present.  Slight weakness to test right cervical muscle strength.  Cardiovascular: Normal rate, regular rhythm and normal heart sounds.   Pulmonary/Chest: Breath sounds normal.  Abdominal:  Soft. Bowel sounds are normal.  Musculoskeletal:  Slightly diminished strength to test right upper arm.   Lymphadenopathy:    She has no cervical adenopathy.  Neurological: She is alert and oriented to person, place, and time. No cranial nerve deficit.  DTR's diminished bilaterally throughout.  Skin: No rash noted.  Psychiatric: She has a normal mood and affect. Her behavior is normal. Judgment and thought content normal.      Assessment & Plan:     1. Cerebral infarction due to unspecified mechanism Recent onset and feeling better but still not full strength yet. Continue follow up with cardiologist to go over TEE. Follow up with neurologist March 2017 as scheduled. Recommend she not drive until neurology release. Continue ASA 325 mg qd and Metoprolol 25 mg BID as recommended by cardiologist. Will get follow up labs and  recheck pending reports.. Encouraged to stop all smoking and plans to contact a smoking cessation program recommended during her hospitalization. - CBC with Differential/Platelet - COMPLETE METABOLIC PANEL WITH GFR

## 2015-02-12 LAB — CBC WITH DIFFERENTIAL/PLATELET
BASOS ABS: 0.1 10*3/uL (ref 0.0–0.2)
Basos: 1 %
EOS (ABSOLUTE): 0.2 10*3/uL (ref 0.0–0.4)
Eos: 2 %
Hematocrit: 37.8 % (ref 34.0–46.6)
Hemoglobin: 12.3 g/dL (ref 11.1–15.9)
Immature Grans (Abs): 0 10*3/uL (ref 0.0–0.1)
Immature Granulocytes: 0 %
LYMPHS ABS: 3 10*3/uL (ref 0.7–3.1)
Lymphs: 28 %
MCH: 27.6 pg (ref 26.6–33.0)
MCHC: 32.5 g/dL (ref 31.5–35.7)
MCV: 85 fL (ref 79–97)
MONOS ABS: 0.6 10*3/uL (ref 0.1–0.9)
Monocytes: 6 %
NEUTROS ABS: 6.6 10*3/uL (ref 1.4–7.0)
Neutrophils: 63 %
Platelets: 229 10*3/uL (ref 150–379)
RBC: 4.45 x10E6/uL (ref 3.77–5.28)
RDW: 13.8 % (ref 12.3–15.4)
WBC: 10.5 10*3/uL (ref 3.4–10.8)

## 2015-02-12 LAB — COMPREHENSIVE METABOLIC PANEL
ALBUMIN: 4.1 g/dL (ref 3.6–4.8)
ALK PHOS: 89 IU/L (ref 39–117)
ALT: 20 IU/L (ref 0–32)
AST: 15 IU/L (ref 0–40)
Albumin/Globulin Ratio: 1.8 (ref 1.1–2.5)
BUN / CREAT RATIO: 20 (ref 11–26)
BUN: 24 mg/dL (ref 8–27)
Bilirubin Total: <0.2 mg/dL (ref 0.0–1.2)
CO2: 20 mmol/L (ref 18–29)
CREATININE: 1.19 mg/dL — AB (ref 0.57–1.00)
Calcium: 9.1 mg/dL (ref 8.7–10.3)
Chloride: 105 mmol/L (ref 96–106)
GFR calc Af Amer: 57 mL/min/{1.73_m2} — ABNORMAL LOW (ref >59)
GFR calc non Af Amer: 50 mL/min/{1.73_m2} — ABNORMAL LOW (ref >59)
GLUCOSE: 158 mg/dL — AB (ref 65–99)
Globulin, Total: 2.3 g/dL (ref 1.5–4.5)
Potassium: 4.8 mmol/L (ref 3.5–5.2)
Sodium: 142 mmol/L (ref 134–144)
Total Protein: 6.4 g/dL (ref 6.0–8.5)

## 2015-02-13 ENCOUNTER — Telehealth: Payer: Self-pay

## 2015-02-13 ENCOUNTER — Other Ambulatory Visit: Payer: Self-pay

## 2015-02-13 ENCOUNTER — Ambulatory Visit: Payer: Medicare Other

## 2015-02-13 NOTE — Telephone Encounter (Signed)
Patient is requesting a refill on Atorvastatin 10 mg and Diclofenac 75 mg be sent to [facility] [address].

## 2015-02-13 NOTE — Telephone Encounter (Signed)
Patient advised as directed below. Patient verbalized understanding.  

## 2015-02-13 NOTE — Telephone Encounter (Signed)
LMTCB

## 2015-02-13 NOTE — Telephone Encounter (Signed)
Pt returned your call.  

## 2015-02-13 NOTE — Telephone Encounter (Signed)
-----   Message from Margo Common, [place] sent at 02/13/2015  1:27 AM EST ----- Blood sugar and kidney function improving. No anemia or sign of infections. Keep appointments with neurologist and cardiologist as planned. Will recheck diabetes blood tests in 3 months.

## 2015-02-14 ENCOUNTER — Encounter: Payer: Self-pay | Admitting: *Deleted

## 2015-02-14 ENCOUNTER — Other Ambulatory Visit: Payer: Self-pay | Admitting: Family Medicine

## 2015-02-14 ENCOUNTER — Other Ambulatory Visit: Payer: Self-pay | Admitting: *Deleted

## 2015-02-14 MED ORDER — ATORVASTATIN CALCIUM 10 MG PO TABS: 10.0000 mg | ORAL_TABLET | Freq: Every day | ORAL | Status: DC

## 2015-02-14 NOTE — Patient Outreach (Signed)
[facility]) Care Management  02/14/2015  Jocelyn Wells 09/10/1955 [id number]   EMMI-Stroke referral: red on EMMI-stroke dashboard for feeling worse overall (patient answered yes) 02/13/15.  Per Epic review -inpatient hospital admission 12/21-12/23/2017 with stroke diagnosis.  Subjective: Telephone call to patient who was advised of reason for call.  HIPPA verification received. Patient stated she had consented to program while she was in hospital and  received electronic phone calls since discharge. States she had responded to question in that manner because she is not 100% back to her old self. States she has right sided weakness and is currently not as strong as she use to be since recent stroke. States she is using cane as she needs it. Currently not getting home therapy. States she can let MD know if she feels she needs speech therapy. States admitting symptoms were trouble with speaking and weakness and trouble moving hand. States has none of theses symptoms now.   Patient voices that she has support from a cousin who lives with she & her husband. States husband works and is out of town currently. Voices that she has been to primary care appointment since hospital discharge. States also has appointment set for follow up with cardiologist next week and neurologist in march. States cousin or other family member take her to doctors appointments.  Patient states she manages her own medications and has no problems getting prescriptions filled. States she is taking medications as prescribed by her doctors and voices understanding of importance of taking medications as prescribed.   Patient states she has not had any of the previous stroke symptoms since previous admission. Stroke symptoms reviewed with patient as well as action plan if symptoms occur.   Objective See health assessment as noted. See listed medications in EPIC.  Assessment: Patient is independent with personal  care. Has assistance from cousin if needed. No problems managing medications or obtaining medications. Patient interested in learning more about strokes. Consents to sending EMMI educational information to her email -[email or web address] Agrees to follow up phone calls.  Plan Care plan developed as noted. Will follow up to complete health assessments.  Patient will have list of medications and medications will be reviewed at  next telephone appointment. Patient agrees with set appointment. Patient understands that appointment will be telephonic. Sherrin Daisy, RN BSN [place] Management Coordinator [facility] Care Management  [phone]

## 2015-02-17 NOTE — Progress Notes (Signed)
Cardiology Office Note Date:  02/17/2015  Patient ID:  Amand, Baysinger 1955/08/23, MRN [id number] PCP:  Vernie Murders, PA  Electrophysilogist: Dr. Lovena Le  **refresh   Chief Complaint: hospital f/u, new CM  History of Present Illness: Jocelyn Wells is a 60 y.o. female with history of cryptogenic stroke discharged from [facility] 01/30/15, where during her stroke evaluation she was noted to have a new finding of CM.  EP was asked to evaluate for loop implant though event monitor given her CM was preferred and to f/u out patient for her CM as well.  At the time of her hospital stay, discussed with neurology service, and was OK to allow normalization of her BP post hospital stay with plan to start BB with her ARB at this time.  She has hx of DM, HTN, COPD, + smoker  **  She comes today feeling **   Past Medical History  Diagnosis Date  . Anxiety   . Depression   . Diabetes mellitus, type II ([place])   . PONV (postoperative nausea and vomiting)     Past Surgical History  Procedure Laterality Date  . Abdominal hysterectomy    . Tubal ligation  1978  . Cesarean section  1978  . Knee arthroscopy Left 2005  . Ulnar nerve transposition  2008  . Bilateral salpingoophorectomy  2000  . Ankle fracture surgery Left 2002  . Tee without cardioversion N/A 01/31/2015    Procedure: TRANSESOPHAGEAL ECHOCARDIOGRAM (TEE);  Surgeon: Lelon Perla, MD;  Location: [facility] ENDOSCOPY;  Service: Cardiovascular;  Laterality: N/A;    Current Outpatient Prescriptions  Medication Sig Dispense Refill  . Apremilast (OTEZLA) 30 MG TABS Take 30 mg by mouth 2 (two) times daily.    Marland Kitchen aspirin 325 MG tablet Take 1 tablet (325 mg total) by mouth daily. 30 tablet 0  . atorvastatin (LIPITOR) 10 MG tablet Take 1 tablet (10 mg total) by mouth daily. 90 tablet 3  . benzonatate (TESSALON) 100 MG capsule TAKE ONE CAPSULE BY MOUTH EVERY NIGHT AT BEDTIME 30 capsule 1  . clonazePAM (KLONOPIN) 0.5 MG tablet Take 1 tablet (0.5 mg  total) by mouth at bedtime. 30 tablet 2  . diclofenac (VOLTAREN) 75 MG EC tablet TAKE 1 TABLET BY MOUTH TWICE A DAY AS NEEDED 60 tablet 3  . glyBURIDE-metformin (GLUCOVANCE) 5-500 MG per tablet Take 1 tablet by mouth 2 (two) times daily.    . Insulin Glargine (TOUJEO SOLOSTAR) 300 UNIT/ML SOPN Inject 40 Units into the skin every evening.    . loratadine (CLARITIN) 10 MG tablet Take 1 tablet by mouth daily.    Marland Kitchen losartan (COZAAR) 50 MG tablet TAKE 1 TABLET BY MOUTH EVERY DAY 30 tablet 6  . metoprolol tartrate (LOPRESSOR) 25 MG tablet Take 0.5 tablets (12.5 mg total) by mouth 2 (two) times daily. 30 tablet 0  . ONE TOUCH ULTRA TEST test strip     . ONETOUCH DELICA LANCETS FINE MISC     . PARoxetine (PAXIL) 40 MG tablet Take 1 tablet (40 mg total) by mouth daily. 90 tablet 3  . zolpidem (AMBIEN) 5 MG tablet Take 5 mg by mouth at bedtime.     No current facility-administered medications for this visit.    Allergies:   Codeine   Social History:  The patient  reports that she has been smoking Cigarettes.  She started smoking about 41 years ago. She has a 35 pack-year smoking history. She has never used smokeless tobacco. She reports  that she does not drink alcohol or use illicit drugs.   Family History:  The patient's family history includes Alcohol abuse in her paternal aunt; Aneurysm in her father; Asthma in her mother; Diabetes in her brother and mother; Heart disease in her father and mother.  ROS:  Please see the history of present illness. Otherwise, review of systems is positive for  All other systems are reviewed and otherwise negative.   PHYSICAL EXAM: ** VS:  There were no vitals taken for this visit. BMI: There is no weight on file to calculate BMI. Well nourished, well developed, in no acute distress HEENT: normocephalic, atraumatic Neck: no JVD, carotid bruits or masses Cardiac:  normal S1, S2; RRR; no significant murmurs, no rubs, or gallops Lungs:  clear to auscultation  bilaterally, no wheezing, rhonchi or rales Abd: soft, nontender,  + BS MS: no deformity or atrophy Ext: no edema Skin: warm and dry, no rash Neuro:  No gross deficits appreciated Psych: euthymic mood, full affect   EKG:  Done today shows **  01/31/15: TEE Impressions: - Severe global reduction in LV function; mild to moderate MR; mild TR; trace AI; small pericardial effusion; negative saline microcavitation study.  01/30/15 Echocardiogram Study Conclusions - Left ventricle: The cavity size was normal. Wall thickness was increased in a pattern of mild LVH. Systolic function was moderately to severely reduced. The estimated ejection fraction was in the range of 30% to 35%. Diffuse hypokinesis. Features are consistent with a pseudonormal left ventricular filling pattern, with concomitant abnormal relaxation and increased filling pressure (grade 2 diastolic dysfunction). - Left atrium: The atrium was mildly dilated. - Pericardium, extracardiac: A small pericardial effusion was identified. Impressions: - Moderate to severe global reduction in LV systolic function; mild LVH; grade 2 diastolic dysfunction; mild LAE; trace MR and TR; small pericardial effusion.  Recent Labs: 01/29/2015: Hemoglobin 12.6 02/11/2015: ALT 20; BUN 24; Creatinine, Ser 1.19*; Platelets 229; Potassium 4.8; Sodium 142  01/30/2015: Cholesterol 158; HDL 33*; LDL Cholesterol 89; Total CHOL/HDL Ratio 4.8; Triglycerides 182*; VLDL 36   Estimated Creatinine Clearance: 58.3 mL/min (by C-G formula based on Cr of 1.19).   Wt Readings from Last 3 Encounters:  02/11/15 208 lb 6.4 oz (94.53 kg)  01/29/15 207 lb 3.2 oz (93.985 kg)  01/21/15 207 lb (93.895 kg)     Other studies reviewed: Additional studies/records reviewed today include: summarized above**  ASSESSMENT AND PLAN:  1. New CM No known hx of CHF On ARB **Needs stress test, add BB  2. HTN  3. CVA 01/29/15 Plan 30 day  EM  4. COPD Smoker, ** counseled  Disposition: F/u with **  Current medicines are reviewed at length with the patient today.  The patient did not have any concerns regarding medicines.Haywood Lasso, PA-C 02/17/2015 12:28 PM     [facility] [address] [place] [address] [phone] (office)  [phone] (fax)    This encounter was created in error - please disregard.

## 2015-02-18 ENCOUNTER — Encounter: Payer: Medicare Other | Admitting: Physician Assistant

## 2015-02-20 ENCOUNTER — Other Ambulatory Visit: Payer: Self-pay | Admitting: *Deleted

## 2015-02-20 NOTE — Patient Outreach (Signed)
[place] [facility]) Care Management  02/20/2015  Jocelyn Wells 1956-01-23 [id number]  EMMI-Stoke follow up call. Subjective: Telephone call to patient who advised that she has not had any stroke symptoms since discharged to home. States no emergency room visits or hospital admissions since our last telephonic contact. States she has had followup with primary care physician and has appointment scheduled for cardiology and neurology follow ups . Has transportation to doctors' appointments. States 1st appointment this week with cardiologist was cancelled due to snow.  Taking all medications as prescribed consistently and has had no difficulty getting medications.  States she has some right sided weakness and uses cane if needed. Patient advised of falls prevention strategies. Patient states she is smoking about 10 cigarettes daily and has cut down. No ready to stop completley but willing to accept materials on smoking cessation.     Objective; Medication review as noted. See updated care plan as noted. See assessments as noted.  Assessment: Patient able to state symptoms of stroke and action pan if symptoms occur. Taking medications as prescribed. Continues to smoke but cutting back. At risk for falls.  Plan:  Update care plan as noted. Send Scientist, clinical (histocompatibility and immunogenetics) on fall prevention strategies and smoking . Follow up next week with patient agreement to set appointment.     Sherrin Daisy, RN BSN [place] Management Coordinator [facility] Care Management  [phone]

## 2015-02-27 ENCOUNTER — Other Ambulatory Visit: Payer: Self-pay | Admitting: *Deleted

## 2015-02-27 NOTE — Patient Outreach (Signed)
[facility]) Care Management  02/27/2015  Jocelyn Wells March 14, 1955 [id number]  EMMI-Stroke follow up call: Telephone call to patient who advises that she is doing well but is unable to talk at this time.  Plan: Will set appointment for another time with patient agreement. Sherrin Daisy, RN BSN [place] Management Coordinator [facility] Care Management  [phone]

## 2015-03-03 ENCOUNTER — Other Ambulatory Visit: Payer: Self-pay | Admitting: *Deleted

## 2015-03-03 NOTE — Patient Outreach (Signed)
[place] [facility]) Care Management  03/03/2015  LOURDES TEACHOUT 1955-08-01 [id number]  Follow up EMMI-stroke call:  Subjective: Telephone call to patient. Patient voices that she has not been to emergency room or been admitted to hospital since our last contact.  States she has not experienced any stroke symptoms that required calling for emergency medical services. Patient was able to verbalize 3 symptoms of stroke and states she would call 911 if they occurred. States she has followed up with primary care doctor and appointment with neurologist in March.  Objective: See completed assessments as noted. Medication review done as noted.  Assessment: No readmissions or emergency room visits since recent admission to hospital. Patient able to identify stroke symptoms and action plan if symptoms occur.  Plan: Update care plan as noted. Follow up with patient next week . Patient agrees with set appointment.

## 2015-03-10 ENCOUNTER — Other Ambulatory Visit: Payer: Self-pay | Admitting: *Deleted

## 2015-03-10 NOTE — Patient Outreach (Signed)
[facility] [facility]) Care Management   03/10/2015  Jocelyn Wells [birth year] [id number]  Jocelyn Wells is an 60 y.o. female   Telephone call to patient for EMMI-stroke follow up call 5.  Subjective:  Patient voices that she has not had any stroke symptoms. Has not has any readmissions since previous admission for stroke symptoms. Voices that she has had follow up appointment with primary care and has appointment scheduled with neurologist for March 2017.   Objective: see medication list as noted; states taking medications as prescribed consistently.   ROS n/a  Physical Exam n/a  Current Medications:   Current Outpatient Prescriptions  Medication Sig Dispense Refill  . Apremilast (OTEZLA) 30 MG TABS Take 30 mg by mouth 2 (two) times daily.    Marland Kitchen aspirin 325 MG tablet Take 1 tablet (325 mg total) by mouth daily. 30 tablet 0  . atorvastatin (LIPITOR) 10 MG tablet Take 1 tablet (10 mg total) by mouth daily. 90 tablet 3  . benzonatate (TESSALON) 100 MG capsule TAKE ONE CAPSULE BY MOUTH EVERY NIGHT AT BEDTIME 30 capsule 1  . clonazePAM (KLONOPIN) 0.5 MG tablet Take 1 tablet (0.5 mg total) by mouth at bedtime. 30 tablet 2  . diclofenac (VOLTAREN) 75 MG EC tablet TAKE 1 TABLET BY MOUTH TWICE A DAY AS NEEDED 60 tablet 3  . glyBURIDE-metformin (GLUCOVANCE) 5-500 MG per tablet Take 1 tablet by mouth 2 (two) times daily.    . Insulin Glargine (TOUJEO SOLOSTAR) 300 UNIT/ML SOPN Inject 40 Units into the skin every evening.    . loratadine (CLARITIN) 10 MG tablet Take 1 tablet by mouth daily.    Marland Kitchen losartan (COZAAR) 50 MG tablet TAKE 1 TABLET BY MOUTH EVERY DAY 30 tablet 6  . metoprolol tartrate (LOPRESSOR) 25 MG tablet Take 0.5 tablets (12.5 mg total) by mouth 2 (two) times daily. 30 tablet 0  . ONE TOUCH ULTRA TEST test strip     . ONETOUCH DELICA LANCETS FINE MISC     . PARoxetine (PAXIL) 40 MG tablet Take 1 tablet (40 mg total) by mouth daily. 90 tablet 3  . zolpidem (AMBIEN) 5 MG  tablet Take 5 mg by mouth at bedtime. Reported on 02/20/2015     No current facility-administered medications for this visit.    Functional Status:   In your present state of health, do you have any difficulty performing the following activities: 02/20/2015 01/30/2015  Hearing? N N  Vision? N N  Difficulty concentrating or making decisions? N N  Walking or climbing stairs? Y N  Dressing or bathing? Y N  Doing errands, shopping? Y N  Preparing Food and eating ? N -  Using the Toilet? N -  In the past six months, have you accidently leaked urine? N -  Do you have problems with loss of bowel control? N -  Managing your Medications? Y -  Managing your Finances? (No Data) -  Housekeeping or managing your Housekeeping? Y -    Fall/Depression Screening:    PHQ 2/9 Scores 02/20/2015  PHQ - 2 Score 0       Plan: Update care plan as noted. Follow up with patient to continue health assessments.  Patient agrees with set appointment.    Sherrin Daisy, RN BSN [place] Management Coordinator [facility] Care Management  [phone]

## 2015-03-14 ENCOUNTER — Encounter: Payer: Self-pay | Admitting: *Deleted

## 2015-03-16 NOTE — Progress Notes (Signed)
Cardiology Office Note Date:  03/17/2015  Patient ID:  Jocelyn Wells, Jocelyn Wells [birth year], MRN [id number] PCP:  Vernie Murders, PA  Cardiologist: Reola Calkins, to [facility], Dr. Lovena Le in hospital   Chief Complaint: hospital f/u  History of Present Illness: Jocelyn Wells is a 60 y.o. female with history of cryptogenic stroke 01/29/15, she also has PMHx of HTN, DM, and COPD.  She was found to have a cardiomyopathy in her CVA work-up and EP was asked to see for ILR, though given the new finding of CM, felt EM was best first evaluation.  The patient comes in today feeling well, mentions generally tired, but not a new complaint, no CP, palpitations or SOB, no dizziness, near syncope or syncope.  She feels at times she has some word finding difficulty but feels she is recovering from her stroke well.  She has seen her PMD, has f/u with neurology next month.  She has never seen a cardiologist before her stroke, has never had any cardiac testing historically.  She hasn't yet worn the EM.  She is still smoking, but actively trying to quit.  Past Medical History  Diagnosis Date  . Anxiety   . Depression   . Diabetes mellitus, type II ([place])   . PONV (postoperative nausea and vomiting)     Past Surgical History  Procedure Laterality Date  . Abdominal hysterectomy    . Tubal ligation  1978  . Cesarean section  1978  . Knee arthroscopy Left 2005  . Ulnar nerve transposition  2008  . Bilateral salpingoophorectomy  2000  . Ankle fracture surgery Left 2002  . Tee without cardioversion N/A 01/31/2015    Procedure: TRANSESOPHAGEAL ECHOCARDIOGRAM (TEE);  Surgeon: Lelon Perla, MD;  Location: [facility] ENDOSCOPY;  Service: Cardiovascular;  Laterality: N/A;    Current Outpatient Prescriptions  Medication Sig Dispense Refill  . Apremilast (OTEZLA) 30 MG TABS Take 30 mg by mouth 2 (two) times daily.    Marland Kitchen aspirin 325 MG tablet Take 1 tablet (325 mg total) by mouth daily. 30 tablet 0  . atorvastatin (LIPITOR) 10 MG  tablet Take 1 tablet (10 mg total) by mouth daily. 90 tablet 3  . benzonatate (TESSALON) 100 MG capsule TAKE ONE CAPSULE BY MOUTH EVERY NIGHT AT BEDTIME 30 capsule 1  . clonazePAM (KLONOPIN) 0.5 MG tablet Take 1 tablet (0.5 mg total) by mouth at bedtime. 30 tablet 2  . diclofenac (VOLTAREN) 75 MG EC tablet TAKE 1 TABLET BY MOUTH TWICE A DAY AS NEEDED 60 tablet 3  . glyBURIDE-metformin (GLUCOVANCE) 5-500 MG per tablet Take 1 tablet by mouth 2 (two) times daily.    . Insulin Glargine (TOUJEO SOLOSTAR) 300 UNIT/ML SOPN Inject 40 Units into the skin every evening.    . loratadine (CLARITIN) 10 MG tablet Take 1 tablet by mouth daily.    Marland Kitchen losartan (COZAAR) 50 MG tablet TAKE 1 TABLET BY MOUTH EVERY DAY 30 tablet 6  . metoprolol tartrate (LOPRESSOR) 25 MG tablet Take 0.5 tablets (12.5 mg total) by mouth 2 (two) times daily. 30 tablet 0  . ONE TOUCH ULTRA TEST test strip     . ONETOUCH DELICA LANCETS FINE MISC     . PARoxetine (PAXIL) 40 MG tablet Take 1 tablet (40 mg total) by mouth daily. 90 tablet 3  . zolpidem (AMBIEN) 5 MG tablet Take 5 mg by mouth at bedtime. Reported on 02/20/2015     No current facility-administered medications for this visit.    Allergies:  Codeine   Social History:  The patient  reports that she has been smoking Cigarettes.  She started smoking about 41 years ago. She has a 35 pack-year smoking history. She has never used smokeless tobacco. She reports that she does not drink alcohol or use illicit drugs.   Family History:  The patient's family history includes Alcohol abuse in her paternal aunt; Aneurysm in her father; Asthma in her mother; Diabetes in her brother and mother; Heart disease in her father and mother.  ROS:  Please see the history of present illness.    All other systems are reviewed and otherwise negative.   PHYSICAL EXAM:  VS:  BP 118/78 mmHg  Pulse 87  Ht 5\' 6"  (1.676 m)  Wt 208 lb (94.348 kg)  BMI 33.59 kg/m2 BMI: Body mass index is 33.59  kg/(m^2). Very pleasent, obese WF in no acute distress HEENT: normocephalic, atraumatic Neck: no JVD, carotid bruits or masses Cardiac:  normal S1, S2; RRR; no significant murmurs, no rubs, or gallops Lungs:  clear to auscultation bilaterally, no wheezing, rhonchi or rales Abd: soft, nontender MS: no deformity or atrophy Ext: no edema Skin: warm and dry, no rash Neuro:  No gross deficits appreciated Psych: euthymic mood, full affect   EKG:  Done today shows SR, RBBB, LAD  01/31/15: TEE Study Conclusions - Left ventricle: Systolic function was severely reduced. The estimated ejection fraction was in the range of 25% to 30%. Diffuse hypokinesis. - Aortic valve: No evidence of vegetation. There was trivial regurgitation. - Mitral valve: No evidence of vegetation. There was mild to moderate regurgitation. - Left atrium: The atrium was mildly dilated. No evidence of thrombus in the atrial cavity or appendage. - Right atrium: No evidence of thrombus in the atrial cavity or appendage. - Atrial septum: No defect or patent foramen ovale was identified. - Tricuspid valve: No evidence of vegetation. - Pulmonic valve: No evidence of vegetation. - Pericardium, extracardiac: A small pericardial effusion was identified. Impressions: - Severe global reduction in LV function; mild to moderate MR; mild TR; trace AI; small pericardial effusion; negative saline microcavitation study.  01/30/15: Echocardiogram Study Conclusions - Left ventricle: The cavity size was normal. Wall thickness was increased in a pattern of mild LVH. Systolic function was moderately to severely reduced. The estimated ejection fraction was in the range of 30% to 35%. Diffuse hypokinesis. Features are consistent with a pseudonormal left ventricular filling pattern, with concomitant abnormal relaxation and increased filling pressure (grade 2 diastolic dysfunction). - Left atrium: The  atrium was mildly dilated. - Pericardium, extracardiac: A small pericardial effusion was identified. Impressions: - Moderate to severe global reduction in LV systolic function; mild LVH; grade 2 diastolic dysfunction; mild LAE; trace MR and TR; small pericardial effusion.  Recent Labs: 01/29/2015: Hemoglobin 12.6 02/11/2015: ALT 20; BUN 24; Creatinine, Ser 1.19*; Platelets 229; Potassium 4.8; Sodium 142  01/30/2015: Cholesterol 158; HDL 33*; LDL Cholesterol 89; Total CHOL/HDL Ratio 4.8; Triglycerides 182*; VLDL 36   CrCl cannot be calculated (Patient has no serum creatinine result on file.).   Wt Readings from Last 3 Encounters:  03/17/15 208 lb (94.348 kg)  02/20/15 207 lb (93.895 kg)  02/11/15 208 lb 6.4 oz (94.53 kg)     Other studies reviewed: Additional studies/records reviewed today include: summarized above  ASSESSMENT AND PLAN:  1. Incidental finding of CM during CVA evaluation     Appears well compensated, no exam evidence of volume OL     on BB/ARB x  <  60month     She has numerous risk factors for CAD, given her CM, discussed with Dr. Lovena Le, recommend R/L cardiac cath     Risk/benefit of cath procedure discussed with the patient and her daughter in law who is with her, she is agreeable to proceed.  2. Cryptogenic stroke     has not worn EM yet     No known hx of arrhythmias  3. HTN     Appears well controlled  4. HLD  5. COPD     Still smoking, actively trying to quit, started a stop smoking program  Disposition: Schedule for R/L heart cath, 30 day event monitor and f/u office visit in 2 months in [place] at her request.  Current medicines are reviewed at length with the patient today.  The patient did not have any concerns regarding medicines.  Haywood Lasso, PA-C 03/17/2015 11:18 AM     [facility] [address] [place]  [address] [phone] (office)  [phone] (fax)

## 2015-03-17 ENCOUNTER — Encounter: Payer: Self-pay | Admitting: *Deleted

## 2015-03-17 ENCOUNTER — Encounter: Payer: Self-pay | Admitting: Physician Assistant

## 2015-03-17 ENCOUNTER — Ambulatory Visit (INDEPENDENT_AMBULATORY_CARE_PROVIDER_SITE_OTHER): Payer: Medicare Other | Admitting: Physician Assistant

## 2015-03-17 VITALS — BP 118/78 | HR 87 | Ht 66.0 in | Wt 208.0 lb

## 2015-03-17 DIAGNOSIS — I429 Cardiomyopathy, unspecified: Secondary | ICD-10-CM | POA: Diagnosis not present

## 2015-03-17 DIAGNOSIS — I10 Essential (primary) hypertension: Secondary | ICD-10-CM

## 2015-03-17 DIAGNOSIS — I639 Cerebral infarction, unspecified: Secondary | ICD-10-CM

## 2015-03-17 MED ORDER — METOPROLOL TARTRATE 25 MG PO TABS: 12.5000 mg | ORAL_TABLET | Freq: Two times a day (BID) | ORAL | Status: DC

## 2015-03-17 NOTE — Patient Instructions (Addendum)
Medication Instructions:    Your physician recommends that you continue on your current medications as directed. Please refer to the Current Medication list given to you today.   If you need a refill on your cardiac medications before your next appointment, please call your pharmacy.  Labwork:  RETURN FOR BMET CBC AND PT/INR   Testing/Procedures:  SEE LETTER FOR CATH 03/25/15   Your physician has recommended that you wear an event monitor.IN [place] IF POSSIBLE  Event monitors are medical devices that record the heart's electrical activity. Doctors most often [place] these monitors to diagnose arrhythmias. Arrhythmias are problems with the speed or rhythm of the heartbeat. The monitor is a small, portable device. You can wear one while you do your normal daily activities. This is usually used to diagnose what is causing palpitations/syncope (passing out).    Follow-Up:  IN 2 MONTHS WITH DR Caryl Comes    Any Other Special Instructions Will Be Listed Below (If Applicable).

## 2015-03-18 ENCOUNTER — Other Ambulatory Visit: Payer: Self-pay | Admitting: Physician Assistant

## 2015-03-19 ENCOUNTER — Other Ambulatory Visit: Payer: Self-pay | Admitting: *Deleted

## 2015-03-19 NOTE — Patient Outreach (Signed)
[facility]) Care Management  03/19/2015  DALAYLA ARLINGHAUS 1955/04/29 [id number]   EMMI-Stroke follow up call. Patient verified per HIPPA verification and agrees to take call from [facility] care coordinator.  Patient states she has not had readmission since recent admission for stroke. States she has not had stroke symptoms but knows to call 911 if symptoms occur. Had recent appointment with cardiologist and plans are for cardiac cath next week 02./14- States she will have family support.   States she has also decided to quit smoking and has signed up for smoking cessation classes at [facility]  which starts in March.  Plan. Update care plan as noted. Send EMMI information on cardiac catheterization via e-mail. Patient agrees to receive information. Follow up with patient. Patient agrees with set appointment.   Sherrin Daisy, RN BSN [place] Management Coordinator [facility] Care Management  [phone]

## 2015-03-21 ENCOUNTER — Ambulatory Visit (INDEPENDENT_AMBULATORY_CARE_PROVIDER_SITE_OTHER): Payer: Medicare Other

## 2015-03-21 ENCOUNTER — Other Ambulatory Visit (INDEPENDENT_AMBULATORY_CARE_PROVIDER_SITE_OTHER): Payer: Medicare Other | Admitting: *Deleted

## 2015-03-21 DIAGNOSIS — I639 Cerebral infarction, unspecified: Secondary | ICD-10-CM | POA: Diagnosis not present

## 2015-03-21 DIAGNOSIS — I4891 Unspecified atrial fibrillation: Secondary | ICD-10-CM | POA: Diagnosis not present

## 2015-03-21 DIAGNOSIS — I429 Cardiomyopathy, unspecified: Secondary | ICD-10-CM | POA: Diagnosis not present

## 2015-03-21 DIAGNOSIS — I6350 Cerebral infarction due to unspecified occlusion or stenosis of unspecified cerebral artery: Secondary | ICD-10-CM | POA: Diagnosis not present

## 2015-03-21 DIAGNOSIS — I428 Other cardiomyopathies: Secondary | ICD-10-CM | POA: Diagnosis not present

## 2015-03-21 LAB — CBC
HCT: 37 % (ref 36.0–46.0)
Hemoglobin: 11.8 g/dL — ABNORMAL LOW (ref 12.0–15.0)
MCH: 27.3 pg (ref 26.0–34.0)
MCHC: 31.9 g/dL (ref 30.0–36.0)
MCV: 85.6 fL (ref 78.0–100.0)
PLATELETS: 177 10*3/uL (ref 150–400)
RBC: 4.32 MIL/uL (ref 3.87–5.11)
RDW: 14.4 % (ref 11.5–15.5)
WBC: 10.4 10*3/uL (ref 4.0–10.5)

## 2015-03-21 LAB — BASIC METABOLIC PANEL
BUN: 31 mg/dL — AB (ref 7–25)
CALCIUM: 8.8 mg/dL (ref 8.6–10.4)
CO2: 24 mmol/L (ref 20–31)
CREATININE: 1.37 mg/dL — AB (ref 0.50–0.99)
Chloride: 104 mmol/L (ref 98–110)
Glucose, Bld: 189 mg/dL — ABNORMAL HIGH (ref 65–99)
Potassium: 4.4 mmol/L (ref 3.5–5.3)
Sodium: 137 mmol/L (ref 135–146)

## 2015-03-22 LAB — PROTIME-INR
INR: 1.05 (ref <1.50)
Prothrombin Time: 13.8 seconds (ref 11.6–15.2)

## 2015-03-24 ENCOUNTER — Other Ambulatory Visit: Payer: Self-pay | Admitting: Physician Assistant

## 2015-03-24 ENCOUNTER — Telehealth: Payer: Self-pay | Admitting: Physician Assistant

## 2015-03-24 DIAGNOSIS — I4891 Unspecified atrial fibrillation: Secondary | ICD-10-CM

## 2015-03-24 DIAGNOSIS — I639 Cerebral infarction, unspecified: Secondary | ICD-10-CM

## 2015-03-24 DIAGNOSIS — I429 Cardiomyopathy, unspecified: Secondary | ICD-10-CM

## 2015-03-24 NOTE — Telephone Encounter (Signed)
Called the patient regarding her labs for cath tomorrow.  Discussed with Dr. Tamala Julian, she takes her losartan in the evenings, she will hold her losartan tonight secondary to her renal function.  She did not have questions regarding her other medicines, she states she was given the instructions already regarding her other medicines, including her DM medication.  She had no other questions or concerns.  Tommye Standard, PA-C

## 2015-03-25 ENCOUNTER — Encounter (HOSPITAL_COMMUNITY): Payer: Self-pay | Admitting: Interventional Cardiology

## 2015-03-25 ENCOUNTER — Ambulatory Visit (HOSPITAL_COMMUNITY)
Admission: RE | Admit: 2015-03-25 | Discharge: 2015-03-25 | Disposition: A | Discharge status: Home / Self Care | Payer: Medicare Other | Source: Ambulatory Visit | Attending: Interventional Cardiology | Admitting: Interventional Cardiology

## 2015-03-25 ENCOUNTER — Encounter (HOSPITAL_COMMUNITY)
Admission: RE | Disposition: A | Discharge status: Home / Self Care | Payer: Self-pay | Source: Ambulatory Visit | Attending: Interventional Cardiology

## 2015-03-25 ENCOUNTER — Other Ambulatory Visit: Payer: Self-pay | Admitting: Physician Assistant

## 2015-03-25 DIAGNOSIS — I110 Hypertensive heart disease with heart failure: Secondary | ICD-10-CM | POA: Diagnosis not present

## 2015-03-25 DIAGNOSIS — Z8249 Family history of ischemic heart disease and other diseases of the circulatory system: Secondary | ICD-10-CM | POA: Insufficient documentation

## 2015-03-25 DIAGNOSIS — F329 Major depressive disorder, single episode, unspecified: Secondary | ICD-10-CM | POA: Insufficient documentation

## 2015-03-25 DIAGNOSIS — Z794 Long term (current) use of insulin: Secondary | ICD-10-CM | POA: Insufficient documentation

## 2015-03-25 DIAGNOSIS — F17210 Nicotine dependence, cigarettes, uncomplicated: Secondary | ICD-10-CM | POA: Diagnosis not present

## 2015-03-25 DIAGNOSIS — I5022 Chronic systolic (congestive) heart failure: Secondary | ICD-10-CM | POA: Insufficient documentation

## 2015-03-25 DIAGNOSIS — E669 Obesity, unspecified: Secondary | ICD-10-CM | POA: Insufficient documentation

## 2015-03-25 DIAGNOSIS — Z8673 Personal history of transient ischemic attack (TIA), and cerebral infarction without residual deficits: Secondary | ICD-10-CM | POA: Diagnosis not present

## 2015-03-25 DIAGNOSIS — Z7982 Long term (current) use of aspirin: Secondary | ICD-10-CM | POA: Diagnosis not present

## 2015-03-25 DIAGNOSIS — E785 Hyperlipidemia, unspecified: Secondary | ICD-10-CM | POA: Diagnosis not present

## 2015-03-25 DIAGNOSIS — E1169 Type 2 diabetes mellitus with other specified complication: Secondary | ICD-10-CM | POA: Diagnosis present

## 2015-03-25 DIAGNOSIS — E119 Type 2 diabetes mellitus without complications: Secondary | ICD-10-CM | POA: Diagnosis not present

## 2015-03-25 DIAGNOSIS — J449 Chronic obstructive pulmonary disease, unspecified: Secondary | ICD-10-CM | POA: Insufficient documentation

## 2015-03-25 DIAGNOSIS — F419 Anxiety disorder, unspecified: Secondary | ICD-10-CM | POA: Insufficient documentation

## 2015-03-25 DIAGNOSIS — I429 Cardiomyopathy, unspecified: Secondary | ICD-10-CM | POA: Insufficient documentation

## 2015-03-25 DIAGNOSIS — I639 Cerebral infarction, unspecified: Secondary | ICD-10-CM | POA: Diagnosis present

## 2015-03-25 DIAGNOSIS — Z6833 Body mass index (BMI) 33.0-33.9, adult: Secondary | ICD-10-CM | POA: Diagnosis not present

## 2015-03-25 DIAGNOSIS — Z8639 Personal history of other endocrine, nutritional and metabolic disease: Secondary | ICD-10-CM

## 2015-03-25 DIAGNOSIS — N179 Acute kidney failure, unspecified: Secondary | ICD-10-CM | POA: Diagnosis present

## 2015-03-25 HISTORY — PX: CARDIAC CATHETERIZATION: SHX172

## 2015-03-25 LAB — POCT I-STAT 3, VENOUS BLOOD GAS (G3P V)
Acid-base deficit: 6 mmol/L — ABNORMAL HIGH (ref 0.0–2.0)
Bicarbonate: 20 mEq/L (ref 20.0–24.0)
O2 SAT: 52 %
PCO2 VEN: 41.5 mmHg — AB (ref 45.0–50.0)
PH VEN: 7.292 (ref 7.250–7.300)
PO2 VEN: 31 mmHg (ref 30.0–45.0)
TCO2: 21 mmol/L (ref 0–100)

## 2015-03-25 LAB — POCT I-STAT 3, ART BLOOD GAS (G3+)
Acid-base deficit: 5 mmol/L — ABNORMAL HIGH (ref 0.0–2.0)
BICARBONATE: 18.9 meq/L — AB (ref 20.0–24.0)
O2 Saturation: 98 %
PCO2 ART: 31.7 mmHg — AB (ref 35.0–45.0)
PH ART: 7.382 (ref 7.350–7.450)
PO2 ART: 103 mmHg — AB (ref 80.0–100.0)
TCO2: 20 mmol/L (ref 0–100)

## 2015-03-25 LAB — GLUCOSE, CAPILLARY
GLUCOSE-CAPILLARY: 82 mg/dL (ref 65–99)
Glucose-Capillary: 73 mg/dL (ref 65–99)

## 2015-03-25 SURGERY — RIGHT/LEFT HEART CATH AND CORONARY ANGIOGRAPHY

## 2015-03-25 MED ORDER — HEPARIN SODIUM (PORCINE) 1000 UNIT/ML IJ SOLN
INTRAMUSCULAR | Status: AC
  Filled 2015-03-25: qty 1

## 2015-03-25 MED ORDER — ASPIRIN 81 MG PO CHEW: 81.0000 mg | CHEWABLE_TABLET | Freq: Once | ORAL | Status: DC

## 2015-03-25 MED ORDER — SODIUM CHLORIDE 0.9 % IV SOLN
INTRAVENOUS | Status: DC
  Administered 2015-03-25: 08:00:00 via INTRAVENOUS

## 2015-03-25 MED ORDER — HEPARIN (PORCINE) IN NACL 2-0.9 UNIT/ML-% IJ SOLN
INTRAMUSCULAR | Status: AC
  Filled 2015-03-25: qty 1000

## 2015-03-25 MED ORDER — SODIUM CHLORIDE 0.9% FLUSH: 3.0000 mL | INTRAVENOUS | Status: DC | PRN

## 2015-03-25 MED ORDER — MIDAZOLAM HCL 2 MG/2ML IJ SOLN
INTRAMUSCULAR | Status: DC | PRN
  Administered 2015-03-25 (×2): 1 mg via INTRAVENOUS

## 2015-03-25 MED ORDER — VERAPAMIL HCL 2.5 MG/ML IV SOLN
INTRAVENOUS | Status: AC
  Filled 2015-03-25: qty 2

## 2015-03-25 MED ORDER — ASPIRIN 81 MG PO CHEW
CHEWABLE_TABLET | ORAL | Status: AC
  Filled 2015-03-25: qty 1

## 2015-03-25 MED ORDER — LIDOCAINE HCL (PF) 1 % IJ SOLN
INTRAMUSCULAR | Status: AC
  Filled 2015-03-25: qty 30

## 2015-03-25 MED ORDER — SODIUM CHLORIDE 0.9 % IV SOLN: 250.0000 mL | INTRAVENOUS | Status: DC | PRN

## 2015-03-25 MED ORDER — VERAPAMIL HCL 2.5 MG/ML IV SOLN
INTRAVENOUS | Status: DC | PRN
  Administered 2015-03-25: 10 mL via INTRA_ARTERIAL

## 2015-03-25 MED ORDER — SODIUM CHLORIDE 0.9% FLUSH: 3.0000 mL | Freq: Two times a day (BID) | INTRAVENOUS | Status: DC

## 2015-03-25 MED ORDER — HEPARIN SODIUM (PORCINE) 1000 UNIT/ML IJ SOLN
INTRAMUSCULAR | Status: DC | PRN
  Administered 2015-03-25: 4500 [IU] via INTRAVENOUS

## 2015-03-25 MED ORDER — IOHEXOL 350 MG/ML SOLN
INTRAVENOUS | Status: DC | PRN
  Administered 2015-03-25: 55 mL via INTRA_ARTERIAL

## 2015-03-25 MED ORDER — FENTANYL CITRATE (PF) 100 MCG/2ML IJ SOLN
INTRAMUSCULAR | Status: AC
  Filled 2015-03-25: qty 2

## 2015-03-25 MED ORDER — FENTANYL CITRATE (PF) 100 MCG/2ML IJ SOLN
INTRAMUSCULAR | Status: DC | PRN
  Administered 2015-03-25: 50 ug via INTRAVENOUS

## 2015-03-25 MED ORDER — HEPARIN (PORCINE) IN NACL 2-0.9 UNIT/ML-% IJ SOLN
INTRAMUSCULAR | Status: DC | PRN
  Administered 2015-03-25: 1500 mL

## 2015-03-25 MED ORDER — LIDOCAINE HCL (PF) 1 % IJ SOLN
INTRAMUSCULAR | Status: DC | PRN
  Administered 2015-03-25 (×2): 5 mL via INTRADERMAL

## 2015-03-25 MED ORDER — MIDAZOLAM HCL 2 MG/2ML IJ SOLN
INTRAMUSCULAR | Status: AC
  Filled 2015-03-25: qty 2

## 2015-03-25 SURGICAL SUPPLY — 13 items
CATH BALLN WEDGE 5F 110CM (CATHETERS) ×2 IMPLANT
CATH INFINITI 5 FR JL3.5 (CATHETERS) ×2 IMPLANT
CATH INFINITI JR4 5F (CATHETERS) ×2 IMPLANT
DEVICE RAD COMP TR BAND LRG (VASCULAR PRODUCTS) ×2 IMPLANT
GLIDESHEATH SLEND A-KIT 6F 22G (SHEATH) ×2 IMPLANT
KIT HEART LEFT (KITS) ×2 IMPLANT
KIT HEART RIGHT NAMIC (KITS) ×2 IMPLANT
PACK CARDIAC CATHETERIZATION (CUSTOM PROCEDURE TRAY) ×2 IMPLANT
SHEATH FAST CATH BRACH 5F 5CM (SHEATH) ×2 IMPLANT
TRANSDUCER W/STOPCOCK (MISCELLANEOUS) ×2 IMPLANT
TUBING CIL FLEX 10 FLL-RA (TUBING) ×2 IMPLANT
WIRE HI TORQ VERSACORE-J 145CM (WIRE) ×2 IMPLANT
WIRE SAFE-T 1.5MM-J .035X260CM (WIRE) ×2 IMPLANT

## 2015-03-25 NOTE — Research (Signed)
CADLAD Informed Consent   Subject Name: Jocelyn Wells  Subject met inclusion and exclusion criteria.  The informed consent form, study requirements and expectations were reviewed with the subject and questions and concerns were addressed prior to the signing of the consent form.  The subject verbalized understanding of the trail requirements.  The subject agreed to participate in the CADLAD trial and signed the informed consent.  The informed consent was obtained prior to performance of any protocol-specific procedures for the subject.  A copy of the signed informed consent was given to the subject and a copy was placed in the subject's medical record.  Hedrick,Jozeph Persing W 03/25/2015, [id number]

## 2015-03-25 NOTE — Progress Notes (Signed)
Site area: right brachial  Site Prior to Removal:  Level 0  Pressure Applied For 15 MINUTES    Minutes Beginning at 0945  Manual:   Yes.    Patient Status During Pull:  unremarkable   Post Pull brachial Site:  Level 0  Post Pull Instructions Given:  Yes.    Post Pull Pulses Present:  Yes.    Dressing Applied:  Yes.    Pt tolerated well

## 2015-03-25 NOTE — H&P (View-Only) (Signed)
Cardiology Office Note Date:  03/17/2015  Patient ID:  Jocelyn Wells, Jocelyn Wells 13-Oct-1955, MRN [id number] PCP:  Vernie Murders, PA  Cardiologist: Reola Calkins, to [facility], Dr. Lovena Le in hospital   Chief Complaint: hospital f/u  History of Present Illness: Jocelyn Wells is a 60 y.o. female with history of cryptogenic stroke 01/29/15, she also has PMHx of HTN, DM, and COPD.  She was found to have a cardiomyopathy in her CVA work-up and EP was asked to see for ILR, though given the new finding of CM, felt EM was best first evaluation.  The patient comes in today feeling well, mentions generally tired, but not a new complaint, no CP, palpitations or SOB, no dizziness, near syncope or syncope.  She feels at times she has some word finding difficulty but feels she is recovering from her stroke well.  She has seen her PMD, has f/u with neurology next month.  She has never seen a cardiologist before her stroke, has never had any cardiac testing historically.  She hasn't yet worn the EM.  She is still smoking, but actively trying to quit.  Past Medical History  Diagnosis Date  . Anxiety   . Depression   . Diabetes mellitus, type II ([place])   . PONV (postoperative nausea and vomiting)     Past Surgical History  Procedure Laterality Date  . Abdominal hysterectomy    . Tubal ligation  1978  . Cesarean section  1978  . Knee arthroscopy Left 2005  . Ulnar nerve transposition  2008  . Bilateral salpingoophorectomy  2000  . Ankle fracture surgery Left 2002  . Tee without cardioversion N/A 01/31/2015    Procedure: TRANSESOPHAGEAL ECHOCARDIOGRAM (TEE);  Surgeon: Lelon Perla, MD;  Location: [facility] ENDOSCOPY;  Service: Cardiovascular;  Laterality: N/A;    Current Outpatient Prescriptions  Medication Sig Dispense Refill  . Apremilast (OTEZLA) 30 MG TABS Take 30 mg by mouth 2 (two) times daily.    Marland Kitchen aspirin 325 MG tablet Take 1 tablet (325 mg total) by mouth daily. 30 tablet 0  . atorvastatin (LIPITOR) 10 MG  tablet Take 1 tablet (10 mg total) by mouth daily. 90 tablet 3  . benzonatate (TESSALON) 100 MG capsule TAKE ONE CAPSULE BY MOUTH EVERY NIGHT AT BEDTIME 30 capsule 1  . clonazePAM (KLONOPIN) 0.5 MG tablet Take 1 tablet (0.5 mg total) by mouth at bedtime. 30 tablet 2  . diclofenac (VOLTAREN) 75 MG EC tablet TAKE 1 TABLET BY MOUTH TWICE A DAY AS NEEDED 60 tablet 3  . glyBURIDE-metformin (GLUCOVANCE) 5-500 MG per tablet Take 1 tablet by mouth 2 (two) times daily.    . Insulin Glargine (TOUJEO SOLOSTAR) 300 UNIT/ML SOPN Inject 40 Units into the skin every evening.    . loratadine (CLARITIN) 10 MG tablet Take 1 tablet by mouth daily.    Marland Kitchen losartan (COZAAR) 50 MG tablet TAKE 1 TABLET BY MOUTH EVERY DAY 30 tablet 6  . metoprolol tartrate (LOPRESSOR) 25 MG tablet Take 0.5 tablets (12.5 mg total) by mouth 2 (two) times daily. 30 tablet 0  . ONE TOUCH ULTRA TEST test strip     . ONETOUCH DELICA LANCETS FINE MISC     . PARoxetine (PAXIL) 40 MG tablet Take 1 tablet (40 mg total) by mouth daily. 90 tablet 3  . zolpidem (AMBIEN) 5 MG tablet Take 5 mg by mouth at bedtime. Reported on 02/20/2015     No current facility-administered medications for this visit.    Allergies:  Codeine   Social History:  The patient  reports that she has been smoking Cigarettes.  She started smoking about 41 years ago. She has a 35 pack-year smoking history. She has never used smokeless tobacco. She reports that she does not drink alcohol or use illicit drugs.   Family History:  The patient's family history includes Alcohol abuse in her paternal aunt; Aneurysm in her father; Asthma in her mother; Diabetes in her brother and mother; Heart disease in her father and mother.  ROS:  Please see the history of present illness.    All other systems are reviewed and otherwise negative.   PHYSICAL EXAM:  VS:  BP 118/78 mmHg  Pulse 87  Ht 5\' 6"  (1.676 m)  Wt 208 lb (94.348 kg)  BMI 33.59 kg/m2 BMI: Body mass index is 33.59  kg/(m^2). Very pleasent, obese WF in no acute distress HEENT: normocephalic, atraumatic Neck: no JVD, carotid bruits or masses Cardiac:  normal S1, S2; RRR; no significant murmurs, no rubs, or gallops Lungs:  clear to auscultation bilaterally, no wheezing, rhonchi or rales Abd: soft, nontender MS: no deformity or atrophy Ext: no edema Skin: warm and dry, no rash Neuro:  No gross deficits appreciated Psych: euthymic mood, full affect   EKG:  Done today shows SR, RBBB, LAD  01/31/15: TEE Study Conclusions - Left ventricle: Systolic function was severely reduced. The estimated ejection fraction was in the range of 25% to 30%. Diffuse hypokinesis. - Aortic valve: No evidence of vegetation. There was trivial regurgitation. - Mitral valve: No evidence of vegetation. There was mild to moderate regurgitation. - Left atrium: The atrium was mildly dilated. No evidence of thrombus in the atrial cavity or appendage. - Right atrium: No evidence of thrombus in the atrial cavity or appendage. - Atrial septum: No defect or patent foramen ovale was identified. - Tricuspid valve: No evidence of vegetation. - Pulmonic valve: No evidence of vegetation. - Pericardium, extracardiac: A small pericardial effusion was identified. Impressions: - Severe global reduction in LV function; mild to moderate MR; mild TR; trace AI; small pericardial effusion; negative saline microcavitation study.  01/30/15: Echocardiogram Study Conclusions - Left ventricle: The cavity size was normal. Wall thickness was increased in a pattern of mild LVH. Systolic function was moderately to severely reduced. The estimated ejection fraction was in the range of 30% to 35%. Diffuse hypokinesis. Features are consistent with a pseudonormal left ventricular filling pattern, with concomitant abnormal relaxation and increased filling pressure (grade 2 diastolic dysfunction). - Left atrium: The  atrium was mildly dilated. - Pericardium, extracardiac: A small pericardial effusion was identified. Impressions: - Moderate to severe global reduction in LV systolic function; mild LVH; grade 2 diastolic dysfunction; mild LAE; trace MR and TR; small pericardial effusion.  Recent Labs: 01/29/2015: Hemoglobin 12.6 02/11/2015: ALT 20; BUN 24; Creatinine, Ser 1.19*; Platelets 229; Potassium 4.8; Sodium 142  01/30/2015: Cholesterol 158; HDL 33*; LDL Cholesterol 89; Total CHOL/HDL Ratio 4.8; Triglycerides 182*; VLDL 36   CrCl cannot be calculated (Patient has no serum creatinine result on file.).   Wt Readings from Last 3 Encounters:  03/17/15 208 lb (94.348 kg)  02/20/15 207 lb (93.895 kg)  02/11/15 208 lb 6.4 oz (94.53 kg)     Other studies reviewed: Additional studies/records reviewed today include: summarized above  ASSESSMENT AND PLAN:  1. Incidental finding of CM during CVA evaluation     Appears well compensated, no exam evidence of volume OL     on BB/ARB x  <  42month     She has numerous risk factors for CAD, given her CM, discussed with Dr. Lovena Le, recommend R/L cardiac cath     Risk/benefit of cath procedure discussed with the patient and her daughter in law who is with her, she is agreeable to proceed.  2. Cryptogenic stroke     has not worn EM yet     No known hx of arrhythmias  3. HTN     Appears well controlled  4. HLD  5. COPD     Still smoking, actively trying to quit, started a stop smoking program  Disposition: Schedule for R/L heart cath, 30 day event monitor and f/u office visit in 2 months in [place] at her request.  Current medicines are reviewed at length with the patient today.  The patient did not have any concerns regarding medicines.  Haywood Lasso, PA-C 03/17/2015 11:18 AM     [facility] [address] [place]  [address] [phone] (office)  [phone] (fax)

## 2015-03-25 NOTE — Interval H&P Note (Signed)
Cath Lab Visit (complete for each Cath Lab visit)  Clinical Evaluation Leading to the Procedure:   ACS: No.  Non-ACS:    Anginal Classification: CCS III  Anti-ischemic medical therapy: Maximal Therapy (2 or more classes of medications)  Non-Invasive Test Results: No non-invasive testing performed  Prior CABG: No previous CABG      History and Physical Interval Note:  03/25/2015 8:43 AM  Jocelyn Wells  has presented today for surgery, with the diagnosis of cm  The various methods of treatment have been discussed with the patient and family. After consideration of risks, benefits and other options for treatment, the patient has consented to  Procedure(s): Right/Left Heart Cath and Coronary Angiography (N/A) as a surgical intervention .  The patient's history has been reviewed, patient examined, no change in status, stable for surgery.  I have reviewed the patient's chart and labs.  Questions were answered to the patient's satisfaction.     Sinclair Grooms

## 2015-03-25 NOTE — Discharge Instructions (Signed)
Do not restart Glucovance until 03-28-15  Radial Site Care Refer to this sheet in the next few weeks. These instructions provide you with information about caring for yourself after your procedure. Your health care provider may also give you more specific instructions. Your treatment has been planned according to current medical practices, but problems sometimes occur. Call your health care provider if you have any problems or questions after your procedure. WHAT TO EXPECT AFTER THE PROCEDURE After your procedure, it is typical to have the following:  Bruising at the radial site that usually fades within 1-2 weeks.  Blood collecting in the tissue (hematoma) that may be painful to the touch. It should usually decrease in size and tenderness within 1-2 weeks. HOME CARE INSTRUCTIONS  Take medicines only as directed by your health care provider.  You may shower 24-48 hours after the procedure or as directed by your health care provider. Remove the bandage (dressing) and gently wash the site with plain soap and water. Pat the area dry with a clean towel. Do not rub the site, because this may cause bleeding.  Do not take baths, swim, or use a hot tub until your health care provider approves.  Check your insertion site every day for redness, swelling, or drainage.  Do not apply powder or lotion to the site.  Do not flex or bend the affected arm for 24 hours or as directed by your health care provider.  Do not push or pull heavy objects with the affected arm for 24 hours or as directed by your health care provider.  Do not lift over 10 lb (4.5 kg) for 5 days after your procedure or as directed by your health care provider.  Ask your health care provider when it is okay to:  Return to work or school.  Resume usual physical activities or sports.  Resume sexual activity.  Do not drive home if you are discharged the same day as the procedure. Have someone else drive you.  You may drive 24  hours after the procedure unless otherwise instructed by your health care provider.  Do not operate machinery or power tools for 24 hours after the procedure.  If your procedure was done as an outpatient procedure, which means that you went home the same day as your procedure, a responsible adult should be with you for the first 24 hours after you arrive home.  Keep all follow-up visits as directed by your health care provider. This is important. SEEK MEDICAL CARE IF:  You have a fever.  You have chills.  You have increased bleeding from the radial site. Hold pressure on the site. SEEK IMMEDIATE MEDICAL CARE IF:  You have unusual pain at the radial site.  You have redness, warmth, or swelling at the radial site.  You have drainage (other than a small amount of blood on the dressing) from the radial site.  The radial site is bleeding, and the bleeding does not stop after 30 minutes of holding steady pressure on the site.  Your arm or hand becomes pale, cool, tingly, or numb.   This information is not intended to replace advice given to you by your health care provider. Make sure you discuss any questions you have with your health care provider.   Document Released: 02/27/2010 Document Revised: 02/15/2014 Document Reviewed: 08/13/2013 Elsevier Interactive Patient Education [employer].

## 2015-03-27 ENCOUNTER — Other Ambulatory Visit: Payer: Self-pay | Admitting: *Deleted

## 2015-03-27 NOTE — Patient Outreach (Signed)
   [place] [facility]) Care Management  03/27/2015  Jocelyn Wells 1955/07/12 [id number]   EMMI-stroke follow up.   Sherrin Daisy, RN BSN [place] Management Coordinator [facility] Care Management  [phone]

## 2015-03-28 NOTE — Patient Outreach (Signed)
[facility]) Care Management  03/28/2015  NIKALA NGHIEM [birth year] [id number]   EMMI-Stroke follow up. Telephone call to patient who voices that she has no had hospital admission since previous admission for stroke. States has not experienced stroke symptoms.  States she had planned outpatient cardiac cath  02/14 and states no blockages were found. States her arm is sore where they entered but has had no other problems. States she has no bleeding or swelling in arm .Marland KitchenStates she knows to notify MD if any problems or signs of infection.  States her cousin has been with her and se has already taken a shower. States she has follow up appointment and has transportation.   Plan; Update care plan as noted. Follow up with patient next week. Patient agrees with set appointment.   Sherrin Daisy, RN BSN [place] Management Coordinator [facility] Care Management  [phone]

## 2015-04-01 DIAGNOSIS — N183 Chronic kidney disease, stage 3 (moderate): Secondary | ICD-10-CM | POA: Diagnosis not present

## 2015-04-01 DIAGNOSIS — Z794 Long term (current) use of insulin: Secondary | ICD-10-CM | POA: Diagnosis not present

## 2015-04-01 DIAGNOSIS — E1165 Type 2 diabetes mellitus with hyperglycemia: Secondary | ICD-10-CM | POA: Diagnosis not present

## 2015-04-01 DIAGNOSIS — E1122 Type 2 diabetes mellitus with diabetic chronic kidney disease: Secondary | ICD-10-CM | POA: Diagnosis not present

## 2015-04-04 DIAGNOSIS — E113393 Type 2 diabetes mellitus with moderate nonproliferative diabetic retinopathy without macular edema, bilateral: Secondary | ICD-10-CM | POA: Diagnosis not present

## 2015-04-04 DIAGNOSIS — Z794 Long term (current) use of insulin: Secondary | ICD-10-CM | POA: Diagnosis not present

## 2015-04-04 DIAGNOSIS — E1129 Type 2 diabetes mellitus with other diabetic kidney complication: Secondary | ICD-10-CM | POA: Diagnosis not present

## 2015-04-04 DIAGNOSIS — R809 Proteinuria, unspecified: Secondary | ICD-10-CM | POA: Diagnosis not present

## 2015-04-04 DIAGNOSIS — E1122 Type 2 diabetes mellitus with diabetic chronic kidney disease: Secondary | ICD-10-CM | POA: Diagnosis not present

## 2015-04-04 DIAGNOSIS — Z9114 Patient's other noncompliance with medication regimen: Secondary | ICD-10-CM | POA: Diagnosis not present

## 2015-04-04 DIAGNOSIS — F17200 Nicotine dependence, unspecified, uncomplicated: Secondary | ICD-10-CM | POA: Diagnosis not present

## 2015-04-04 DIAGNOSIS — N183 Chronic kidney disease, stage 3 (moderate): Secondary | ICD-10-CM | POA: Diagnosis not present

## 2015-04-07 DIAGNOSIS — E119 Type 2 diabetes mellitus without complications: Secondary | ICD-10-CM | POA: Insufficient documentation

## 2015-04-10 ENCOUNTER — Other Ambulatory Visit: Payer: Self-pay | Admitting: *Deleted

## 2015-04-10 NOTE — Patient Outreach (Signed)
[facility]) Care Management  04/10/2015  Jocelyn Wells 10-23-1955 [id number]   EMMI-Stroke follow up/case out:  Telephone call to patient who voices that she has had no problems since outpatient procedure 2 weeks ago. States she has follow up appointment scheduled with cardiologist and plans to attend appointment. States family member will provide transportation.   Patient has had no hospital admissions or emergency room visits since out last telephonic contact. Patient states she has not fallen. States no stroke symptoms experienced.  Advised patient that [name] has been completed and she would receive no more calls.  Plan: Update care plan. Goals met. Send to care management assistant to close out.   Sherrin Daisy, RN BSN [place] Management Coordinator [facility] Care Management  [phone]

## 2015-04-17 ENCOUNTER — Ambulatory Visit (INDEPENDENT_AMBULATORY_CARE_PROVIDER_SITE_OTHER): Payer: Medicare Other | Admitting: Internal Medicine

## 2015-04-17 ENCOUNTER — Encounter: Payer: Self-pay | Admitting: Internal Medicine

## 2015-04-17 VITALS — BP 140/82 | HR 68 | Ht 66.0 in | Wt 214.5 lb

## 2015-04-17 DIAGNOSIS — I639 Cerebral infarction, unspecified: Secondary | ICD-10-CM

## 2015-04-17 DIAGNOSIS — R0602 Shortness of breath: Secondary | ICD-10-CM

## 2015-04-17 DIAGNOSIS — I429 Cardiomyopathy, unspecified: Secondary | ICD-10-CM

## 2015-04-17 DIAGNOSIS — I519 Heart disease, unspecified: Secondary | ICD-10-CM

## 2015-04-17 DIAGNOSIS — I428 Other cardiomyopathies: Secondary | ICD-10-CM

## 2015-04-17 DIAGNOSIS — R079 Chest pain, unspecified: Secondary | ICD-10-CM | POA: Diagnosis not present

## 2015-04-17 MED ORDER — CARVEDILOL 6.25 MG PO TABS: 6.2500 mg | ORAL_TABLET | Freq: Two times a day (BID) | ORAL | Status: DC

## 2015-04-17 MED ORDER — SPIRONOLACTONE 25 MG PO TABS: 12.5000 mg | ORAL_TABLET | Freq: Every day | ORAL | Status: DC

## 2015-04-17 NOTE — Progress Notes (Signed)
ELECTROPHYSIOLOGY CONSULT NOTE  Patient ID: Jocelyn Wells, MRN: [id number], DOB/AGE: [birth year] 60 y.o. Admit date: (Not on file) Date of Consult: 04/17/2015  Primary Physician: Vernie Murders, PA Primary Cardiologist: new n Consulting Physician new  Chief Complaint: ICD/AFib   HPI Jocelyn Wells is a 60 y.o. female  Referred for consideration of a loop recorder implantation for cryptogenic stroke   she was admitted to [place] 12/16 with an acute ischemic stroke. It was thought to be highly likely  For an embolic event. TEE was unrevealing and she underwent a 30 day outpatient monitor. This was related  Event recorder applied and apparently preliminarily demonstrates no atrial fibrillation.   During the evaluation of her stroke, she was noted to have a cardiomyopathy. Ejection fraction of 25-30%. posthospitalization reevaluation by  RU AP with  Consultation with Dr. Elliot Cousin  Recommended catheterization. This demonstrated normal coronary arteries and confirmed ejection fraction.   She was treated with metoprolol tartrate ; she is not on Aldactone. She is on losartan.  She has moderate shortness of breath. She has peripheral edema occasionally. She has stable two-pillow orthopnea. She has not had tachypalpitations. She has not had syncope.   Past Medical History  Diagnosis Date  . Anxiety   . Depression   . Diabetes mellitus, type II ([place])   . PONV (postoperative nausea and vomiting)   . Cardiomyopathy [facility])     new to her Jan 2017  . HTN (hypertension)   . Obesity   . COPD (chronic obstructive pulmonary disease) ([place])   . Stroke [facility])     Jan 2017      Surgical History:  Past Surgical History  Procedure Laterality Date  . Abdominal hysterectomy    . Tubal ligation  1978  . Cesarean section  1978  . Knee arthroscopy Left 2005  . Ulnar nerve transposition  2008  . Bilateral salpingoophorectomy  2000  . Ankle fracture surgery Left 2002  . Tee without cardioversion  N/A 01/31/2015    Procedure: TRANSESOPHAGEAL ECHOCARDIOGRAM (TEE);  Surgeon: Lelon Perla, MD;  Location: [facility] ENDOSCOPY;  Service: Cardiovascular;  Laterality: N/A;  . Cardiac catheterization N/A 03/25/2015    Procedure: Right/Left Heart Cath and Coronary Angiography;  Surgeon: Belva Crome, MD;  Location: [place] CV LAB;  Service: Cardiovascular;  Laterality: N/A;     Home Meds: Prior to Admission medications   Medication Sig Start Date End Date Taking? Authorizing Provider  Apremilast (OTEZLA) 30 MG TABS Take 30 mg by mouth 2 (two) times daily.   Yes Historical Provider, MD  aspirin 325 MG tablet Take 1 tablet (325 mg total) by mouth daily. 01/31/15  Yes Albertine Patricia, MD  atorvastatin (LIPITOR) 10 MG tablet Take 1 tablet (10 mg total) by mouth daily. 02/14/15  Yes Dennis E Chrismon, PA  clonazePAM (KLONOPIN) 0.5 MG tablet Take 1 tablet (0.5 mg total) by mouth at bedtime. Patient taking differently: Take 0.5 mg by mouth at bedtime as needed for anxiety.  01/21/15  Yes Rainey Pines, MD  diclofenac (VOLTAREN) 75 MG EC tablet TAKE 1 TABLET BY MOUTH TWICE A DAY AS NEEDED 02/14/15  Yes Dennis E Chrismon, PA  glyBURIDE-metformin (GLUCOVANCE) 5-500 MG per tablet Take 1 tablet by mouth 2 (two) times daily.   Yes Historical Provider, MD  Insulin Glargine (TOUJEO SOLOSTAR) 300 UNIT/ML SOPN Inject 40 Units into the skin every evening. Patient taking differently: Inject 60 Units into the skin every evening.  01/31/15  Yes Silver Huguenin Elgergawy, MD  loratadine (CLARITIN) 10 MG tablet Take 1 tablet by mouth daily.   Yes Historical Provider, MD  losartan (COZAAR) 50 MG tablet TAKE 1 TABLET BY MOUTH EVERY DAY 10/21/14  Yes Dennis E Chrismon, PA  metoprolol tartrate (LOPRESSOR) 25 MG tablet Take 0.5 tablets (12.5 mg total) by mouth 2 (two) times daily. 03/17/15  Yes Renee Dyane Dustman, PA-C  ONE TOUCH ULTRA TEST test strip  06/19/14  Yes Historical Provider, MD  [place]  06/19/14  Yes  Historical Provider, MD  PARoxetine (PAXIL) 40 MG tablet Take 1 tablet (40 mg total) by mouth daily. 01/21/15  Yes Rainey Pines, MD    Allergies:  Allergies  Allergen Reactions  . Codeine Nausea And Vomiting    Social History   Social History  . Marital Status: Married    Spouse Name: N/A  . Number of Children: N/A  . Years of Education: N/A   Occupational History  . Not on file.   Social History Main Topics  . Smoking status: Current Every Day Smoker -- 1.00 packs/day for 35 years    Types: Cigarettes    Start date: 02/08/1974  . Smokeless tobacco: Never Used  . Alcohol Use: No  . Drug Use: No  . Sexual Activity: Yes    Birth Control/ Protection: None   Other Topics Concern  . Not on file   Social History Narrative     Family History  Problem Relation Age of Onset  . Heart disease Mother     died from CHF  . Asthma Mother   . Diabetes Mother   . Heart disease Father   . Aneurysm Father   . Diabetes Brother   . Alcohol abuse Paternal Aunt      ROS:  Please see the history of present illness.     All other systems reviewed and negative.    Physical Exam: Blood pressure 140/82, pulse 68, height 5\' 6"  (1.676 m), weight 214 lb 8 oz (97.297 kg). General: Well developed, well nourished female in no acute distress. Head: Normocephalic, atraumatic, sclera non-icteric, no xanthomas, nares are without discharge. EENT: normal  Lymph Nodes:  none Neck: Negative for carotid bruits. JVD not elevated. Back:without scoliosis kyphosis  Lungs: Clear bilaterally to auscultation without wheezes, rales, or rhonchi. Breathing is unlabored. Heart: RRR with S1 S2.  2/6 systolic  murmur . No rubs, or gallops appreciated. Abdomen: Soft, non-tender, non-distended with normoactive bowel sounds. No hepatomegaly. No rebound/guarding. No obvious abdominal masses. Msk:  Strength and tone appear normal for age. Extremities: No clubbing or cyanosis. No edema.  Distal pedal pulses are 2+  and equal bilaterally. Skin: Warm and Dry Neuro: Alert and oriented X 3. CN III-XII intact Grossly normal sensory and motor function . Speech is a little bit of  dysarthric Psych:  Responds to questions appropriately with a normal affect.      Labs: Cardiac Enzymes No results for input(s): CKTOTAL, CKMB, TROPONINI in the last 72 hours. CBC Lab Results  Component Value Date   WBC 10.4 03/21/2015   HGB 11.8* 03/21/2015   HCT 37.0 03/21/2015   MCV 85.6 03/21/2015   PLT 177 03/21/2015   PROTIME: No results for input(s): LABPROT, INR in the last 72 hours. Chemistry No results for input(s): NA, K, CL, CO2, BUN, CREATININE, CALCIUM, PROT, BILITOT, ALKPHOS, ALT, AST, GLUCOSE in the last 168 hours.  Invalid input(s): LABALBU Lipids Lab Results  Component Value  Date   CHOL 158 01/30/2015   HDL 33* 01/30/2015   LDLCALC 89 01/30/2015   TRIG 182* 01/30/2015   BNP No results found for: PROBNP Thyroid Function Tests: No results for input(s): TSH, T4TOTAL, T3FREE, THYROIDAB in the last 72 hours.  Invalid input(s): FREET3 Miscellaneous No results found for: DDIMER  Radiology/Studies:  No results found.  EKG:  Sinus rhythm 68 Intervals 19/14/44 RSR prime Left ventricular hypertrophy   Assessment and Plan:  Nonischemic cardiomyopathy  Sleep disordered breathing  Cryptogenic stroke   The patient has persistent left ventricular dysfunction2 months following initial evaluation. Guideline directed recommendations for medical therapy would include a beta blocker which we will do to carvedilol 6.25 twice daily. In addition we'll add L sprironolactone. I reviewed side effects we'll plan to check a metabolic profile next week  We'll anticipate review of the LV function in about 2 months. At that juncture, if LV function remains depressed would consider the implantation of a defibrillator for [employer] using the Lorentz plot algorithm for atrial fibrillation detection  wiil ever  seen with the PAs in a couple of weeks to see if any further medication adjustments can be accomplished.   We'll ask Signe Colt BFP to pursue a sleep study      Virl Axe

## 2015-04-17 NOTE — Patient Instructions (Addendum)
Medication Instructions: 1) Stop Metoprolol tartrate 2) Start Coreg (carvedilol) 6.25 mg one tablet by mouth twice daily 3) Start Aldactone (spironolactone) 25 mg 1/2 tablet (12.5 mg) by mouth once daily  Labwork: - Your physician recommends that you return for lab work in: 4 days- BMP  Procedures/Testing: - Your physician has requested that you have an echocardiogram in 2 months. Echocardiography is a painless test that uses sound waves to create images of your heart. It provides your doctor with information about the size and shape of your heart and how well your heart's chambers and valves are working. This procedure takes approximately one hour. There are no restrictions for this procedure.  Follow-Up: - Your physician recommends that you schedule a follow-up appointment in: 4 weeks with the PA  - Your physician wants you to follow-up in: 3 months with Dr. Caryl Comes. You will receive a reminder letter in the mail two months in advance. If you don't receive a letter, please call our office to schedule the follow-up appointment.  Any Additional Special Instructions Will Be Listed Below (If Applicable).     If you need a refill on your cardiac medications before your next appointment, please call your pharmacy.

## 2015-04-22 ENCOUNTER — Other Ambulatory Visit
Admission: RE | Admit: 2015-04-22 | Discharge: 2015-04-22 | Disposition: A | Discharge status: Home / Self Care | Payer: Medicare Other | Source: Ambulatory Visit | Attending: Internal Medicine | Admitting: Internal Medicine

## 2015-04-22 ENCOUNTER — Ambulatory Visit (INDEPENDENT_AMBULATORY_CARE_PROVIDER_SITE_OTHER): Payer: Medicare Other | Admitting: Psychiatry

## 2015-04-22 ENCOUNTER — Encounter: Payer: Self-pay | Admitting: Psychiatry

## 2015-04-22 VITALS — BP 142/82 | HR 82 | Temp 97.4°F | Ht 66.0 in | Wt 212.4 lb

## 2015-04-22 DIAGNOSIS — F331 Major depressive disorder, recurrent, moderate: Secondary | ICD-10-CM

## 2015-04-22 DIAGNOSIS — I639 Cerebral infarction, unspecified: Secondary | ICD-10-CM

## 2015-04-22 DIAGNOSIS — I428 Other cardiomyopathies: Secondary | ICD-10-CM

## 2015-04-22 DIAGNOSIS — I429 Cardiomyopathy, unspecified: Secondary | ICD-10-CM | POA: Insufficient documentation

## 2015-04-22 LAB — BASIC METABOLIC PANEL
Anion gap: 4 — ABNORMAL LOW (ref 5–15)
BUN: 27 mg/dL — AB (ref 6–20)
CALCIUM: 8.7 mg/dL — AB (ref 8.9–10.3)
CO2: 23 mmol/L (ref 22–32)
CREATININE: 1.3 mg/dL — AB (ref 0.44–1.00)
Chloride: 108 mmol/L (ref 101–111)
GFR calc non Af Amer: 43 mL/min — ABNORMAL LOW (ref >60)
GFR, EST AFRICAN AMERICAN: 50 mL/min — AB (ref >60)
Glucose, Bld: 273 mg/dL — ABNORMAL HIGH (ref 65–99)
Potassium: 4.2 mmol/L (ref 3.5–5.1)
SODIUM: 135 mmol/L (ref 135–145)

## 2015-04-22 MED ORDER — PAROXETINE HCL 40 MG PO TABS: 40.0000 mg | ORAL_TABLET | Freq: Every day | ORAL | Status: DC

## 2015-04-22 NOTE — Progress Notes (Signed)
BH MD/PA/NP OP Progress Note  04/22/2015 3:27 PM Jocelyn Wells  MRN:  [id number]  Subjective:   Pt is  a 60 year old female who presented for follow-up appointment. She reported that she had a stroke before Christmas and she was taking to [facility] in [place]. Patient reported that she came back after doing some shopping and was sitting in the sofa and when she experienced numbness in her hands. She reported that her family members were very supportive. She reports that she continues to have some difficulty with speech as well as the thought processing. She has a neurology appointment tomorrow. Patient reported that she also went to the cardiologist that they have noticed that she has some cardiac issues. She had the catheterization  done in February no blockage was found. Patient reported that she takes Klonopin on a when necessary basis. She is compliant with her medications. She currently denied having any acute mood symptoms anger anxiety or paranoia.  She is currently not driving and her stepson is helping her and is driving her around. Patient reported that she feels that her skills are not as strong and she feels not comfortable driving the car again after her stroke.  She is also using the walker for ambulation.    Chief Complaint:  Chief Complaint    Follow-up; Medication Refill     Visit Diagnosis:     ICD-9-CM ICD-10-CM   1. MDD (major depressive disorder), recurrent episode, moderate ([place]) 296.32 F33.1     Past Medical History:  Past Medical History  Diagnosis Date  . Anxiety   . Depression   . Diabetes mellitus, type II ([place])   . PONV (postoperative nausea and vomiting)   . Cardiomyopathy [facility])     new to her Jan 2017  . HTN (hypertension)   . Obesity   . COPD (chronic obstructive pulmonary disease) ([place])   . Stroke [facility])     Jan 2017    Past Surgical History  Procedure Laterality Date  . Abdominal hysterectomy    . Tubal ligation  1978  . Cesarean section   1978  . Knee arthroscopy Left 2005  . Ulnar nerve transposition  2008  . Bilateral salpingoophorectomy  2000  . Ankle fracture surgery Left 2002  . Tee without cardioversion N/A 01/31/2015    Procedure: TRANSESOPHAGEAL ECHOCARDIOGRAM (TEE);  Surgeon: Lelon Perla, MD;  Location: [facility] ENDOSCOPY;  Service: Cardiovascular;  Laterality: N/A;  . Cardiac catheterization N/A 03/25/2015    Procedure: Right/Left Heart Cath and Coronary Angiography;  Surgeon: Belva Crome, MD;  Location: [place] CV LAB;  Service: Cardiovascular;  Laterality: N/A;   Family History:  Family History  Problem Relation Age of Onset  . Heart disease Mother     died from CHF  . Asthma Mother   . Diabetes Mother   . Heart disease Father   . Aneurysm Father   . Diabetes Brother   . Alcohol abuse Paternal Aunt    Social History:  Social History   Social History  . Marital Status: Married    Spouse Name: N/A  . Number of Children: N/A  . Years of Education: N/A   Social History Main Topics  . Smoking status: Current Every Day Smoker -- 1.00 packs/day for 35 years    Types: Cigarettes    Start date: 02/08/1974  . Smokeless tobacco: Never Used  . Alcohol Use: No  . Drug Use: No  . Sexual Activity: Yes    Birth  Control/ Protection: None   Other Topics Concern  . None   Social History Narrative   Additional History:  Married x 18 years. Has 2 children and Her husband has 2 children as well Assessment:   Musculoskeletal: Strength & Muscle Tone: within normal limits Gait & Station: normal Patient leans: N/A  Psychiatric Specialty Exam: HPI   Review of Systems  Constitutional: Negative for chills and weight loss.  HENT: Negative for nosebleeds and tinnitus.   Eyes: Negative for double vision and discharge.  Respiratory: Positive for cough.   Cardiovascular: Negative for orthopnea.  Gastrointestinal: Positive for nausea and vomiting. Negative for diarrhea and blood in stool.  Genitourinary:  Negative for urgency.  Skin: Negative for rash.  Neurological: Positive for sensory change, speech change and focal weakness. Negative for tingling and headaches.  Endo/Heme/Allergies: Negative for environmental allergies.  Psychiatric/Behavioral: Positive for depression.  All other systems reviewed and are negative.   Blood pressure 142/82, pulse 82, temperature 97.4 F (36.3 C), temperature source Tympanic, height 5\' 6"  (1.676 m), weight 212 lb 6.4 oz (96.344 kg), SpO2 95 %.Body mass index is 34.3 kg/(m^2).  General Appearance: Casual  Eye Contact:  Fair  Speech:  Slow  Volume:  Normal  Mood:  Depressed  Affect:  Congruent  Thought Process:  Coherent  Orientation:  Full (Time, Place, and Person)  Thought Content:  WDL  Suicidal Thoughts:  No  Homicidal Thoughts:  No  Memory:  Immediate;   Fair  Judgement:  Fair  Insight:  Fair  Psychomotor Activity:  Normal  Concentration:  Fair  Recall:  [employer] of Knowledge: Fair  Language: Fair  Akathisia:  No  Handed:  Right  AIMS (if indicated):  none  Assets:  Communication Skills Desire for Improvement Physical Health Social Support  ADL's:  Intact  Cognition: WNL  Sleep:  6-7   Is the patient at risk to self?  No. Has the patient been a risk to self in the past 6 months?  No. Has the patient been a risk to self within the distant past?  No. Is the patient a risk to others?  No. Has the patient been a risk to others in the past 6 months?  No. Has the patient been a risk to others within the distant past?  No.  Current Medications: Current Outpatient Prescriptions  Medication Sig Dispense Refill  . Apremilast (OTEZLA) 30 MG TABS Take 30 mg by mouth 2 (two) times daily.    Marland Kitchen aspirin 325 MG tablet Take 1 tablet (325 mg total) by mouth daily. 30 tablet 0  . atorvastatin (LIPITOR) 10 MG tablet Take 1 tablet (10 mg total) by mouth daily. 90 tablet 3  . carvedilol (COREG) 6.25 MG tablet Take 1 tablet (6.25 mg total) by mouth 2  (two) times daily. 180 tablet 3  . clonazePAM (KLONOPIN) 0.5 MG tablet Take 1 tablet (0.5 mg total) by mouth at bedtime. (Patient taking differently: Take 0.5 mg by mouth at bedtime as needed for anxiety. ) 30 tablet 2  . diclofenac (VOLTAREN) 75 MG EC tablet TAKE 1 TABLET BY MOUTH TWICE A DAY AS NEEDED 60 tablet 3  . glyBURIDE-metformin (GLUCOVANCE) 5-500 MG per tablet Take 1 tablet by mouth 2 (two) times daily.    . Insulin Glargine (TOUJEO SOLOSTAR) 300 UNIT/ML SOPN Inject 40 Units into the skin every evening. (Patient taking differently: Inject 60 Units into the skin every evening. )    . loratadine (CLARITIN) 10 MG tablet  Take 1 tablet by mouth daily.    Marland Kitchen losartan (COZAAR) 50 MG tablet TAKE 1 TABLET BY MOUTH EVERY DAY 30 tablet 6  . ONE TOUCH ULTRA TEST test strip     . ONETOUCH DELICA LANCETS FINE MISC     . PARoxetine (PAXIL) 40 MG tablet Take 1 tablet (40 mg total) by mouth daily. 90 tablet 3  . spironolactone (ALDACTONE) 25 MG tablet Take 0.5 tablets (12.5 mg total) by mouth daily. 45 tablet 3   No current facility-administered medications for this visit.    Medical Decision Making:  Established Problem, Stable/Improving (1), Review of Psycho-Social Stressors (1) and Review of Last Therapy Session (1)  Treatment Plan Summary:Medication management   Depression and anxiety She is currently taking Paxil 40 mg by mouth daily- 90 day supply given  Patient takes Klonopin on a when necessary basis- 3 month supply given  Follow-up Follow-up in 2 months or earlier depending on her symptoms   More than 50% of the time spent in psychoeducation, counseling and coordination of care.     This note was generated in part or whole with voice recognition software. Voice regonition is usually quite accurate but there are transcription errors that can and very often do occur. I apologize for any typographical errors that were not detected and corrected.   Rainey Pines, MD

## 2015-04-23 ENCOUNTER — Encounter: Payer: Self-pay | Admitting: Neurology

## 2015-04-23 ENCOUNTER — Ambulatory Visit (INDEPENDENT_AMBULATORY_CARE_PROVIDER_SITE_OTHER): Payer: Medicare Other | Admitting: Neurology

## 2015-04-23 VITALS — BP 127/76 | HR 74 | Ht 66.0 in | Wt 212.0 lb

## 2015-04-23 DIAGNOSIS — I63132 Cerebral infarction due to embolism of left carotid artery: Secondary | ICD-10-CM | POA: Diagnosis not present

## 2015-04-23 DIAGNOSIS — G4710 Hypersomnia, unspecified: Secondary | ICD-10-CM | POA: Diagnosis not present

## 2015-04-23 DIAGNOSIS — R5383 Other fatigue: Secondary | ICD-10-CM | POA: Diagnosis not present

## 2015-04-23 DIAGNOSIS — I639 Cerebral infarction, unspecified: Secondary | ICD-10-CM

## 2015-04-23 DIAGNOSIS — Z8673 Personal history of transient ischemic attack (TIA), and cerebral infarction without residual deficits: Secondary | ICD-10-CM | POA: Insufficient documentation

## 2015-04-23 NOTE — Patient Instructions (Signed)
I had a long d/w patient and daughter in law about her recent stroke, risk for recurrent stroke/TIAs, personally independently reviewed imaging studies and stroke evaluation results and answered questions.Continue aspirin 325 mg daily  for secondary stroke prevention and maintain strict control of hypertension with blood pressure goal below 130/90, diabetes with hemoglobin A1c goal below 6.5% and lipids with LDL cholesterol goal below 70 mg/dL. I also advised the patient to eat a healthy diet with plenty of whole grains, cereals, fruits and vegetables, exercise regularly and maintain ideal body weight . Check 4 week external heart monitor results and follow-up echocardiogram with her cardiologist. Check polysomnogram for sleep apnea. Followup in the future with stroke nurse practitioner in 6 months or call earlier if necessary. Stroke Prevention Some medical conditions and behaviors are associated with an increased chance of having a stroke. You may prevent a stroke by making healthy choices and managing medical conditions. HOW CAN I REDUCE MY RISK OF HAVING A STROKE?   Stay physically active. Get at least 30 minutes of activity on most or all days.  Do not smoke. It may also be helpful to avoid exposure to secondhand smoke.  Limit alcohol use. Moderate alcohol use is considered to be:  No more than 2 drinks per day for men.  No more than 1 drink per day for nonpregnant women.  Eat healthy foods. This involves:  Eating 5 or more servings of fruits and vegetables a day.  Making dietary changes that address high blood pressure (hypertension), high cholesterol, diabetes, or obesity.  Manage your cholesterol levels.  Making food choices that are high in fiber and low in saturated fat, trans fat, and cholesterol may control cholesterol levels.  Take any prescribed medicines to control cholesterol as directed by your health care provider.  Manage your diabetes.  Controlling your carbohydrate  and sugar intake is recommended to manage diabetes.  Take any prescribed medicines to control diabetes as directed by your health care provider.  Control your hypertension.  Making food choices that are low in salt (sodium), saturated fat, trans fat, and cholesterol is recommended to manage hypertension.  Ask your health care provider if you need treatment to lower your blood pressure. Take any prescribed medicines to control hypertension as directed by your health care provider.  If you are 54-98 years of age, have your blood pressure checked every 3-5 years. If you are 5 years of age or older, have your blood pressure checked every year.  Maintain a healthy weight.  Reducing calorie intake and making food choices that are low in sodium, saturated fat, trans fat, and cholesterol are recommended to manage weight.  Stop drug abuse.  Avoid taking birth control pills.  Talk to your health care provider about the risks of taking birth control pills if you are over 44 years old, smoke, get migraines, or have ever had a blood clot.  Get evaluated for sleep disorders (sleep apnea).  Talk to your health care provider about getting a sleep evaluation if you snore a lot or have excessive sleepiness.  Take medicines only as directed by your health care provider.  For some people, aspirin or blood thinners (anticoagulants) are helpful in reducing the risk of forming abnormal blood clots that can lead to stroke. If you have the irregular heart rhythm of atrial fibrillation, you should be on a blood thinner unless there is a good reason you cannot take them.  Understand all your medicine instructions.  Make sure that other conditions (  such as anemia or atherosclerosis) are addressed. SEEK IMMEDIATE MEDICAL CARE IF:   You have sudden weakness or numbness of the face, arm, or leg, especially on one side of the body.  Your face or eyelid droops to one side.  You have sudden confusion.  You  have trouble speaking (aphasia) or understanding.  You have sudden trouble seeing in one or both eyes.  You have sudden trouble walking.  You have dizziness.  You have a loss of balance or coordination.  You have a sudden, severe headache with no known cause.  You have new chest pain or an irregular heartbeat. Any of these symptoms may represent a serious problem that is an emergency. Do not wait to see if the symptoms will go away. Get medical help at once. Call your local emergency services (911 in [place] Do not drive yourself to the hospital.   This information is not intended to replace advice given to you by your health care provider. Make sure you discuss any questions you have with your health care provider.   Document Released: 03/04/2004 Document Revised: 02/15/2014 Document Reviewed: 07/28/2012 Elsevier Interactive Patient Education [employer].

## 2015-04-23 NOTE — Progress Notes (Signed)
[facility] [address] [place]. [place] [address] [phone]       OFFICE FOLLOW-UP NOTE  [name] Date of Birth:  December 25, 1955 Medical Record Number:  [id number]   HPI: 3 year Caucasian lady seen today for first office follow-up visit following hospital admission for stroke in December 2016. Jocelyn Wells is a 60 y.o. female with a history of DM, HTN who presented with sudden onset right hand weakness, right sided numbness and mild word finding difficulty that started at 2pm 01/29/2015 ([facility]). She states that she noticed that she was slurring her speech. She was brought into the emergency room where a code stroke was activated. Extensive discussion was had with the patient and her son. With an NIH of 3, it is not typical to offer TPA and therefore I discussed this with the son and patient who agreed holding off on TPA at this time. Patient was not administered TPA secondary to mild symptoms. She was admitted for further evaluation and treatment. MRI scan of the brain showed a patchy small left MCA branch infarct involving posterior left frontal lobe and insular cortex. CT angiogram of the brain showed a high-grade stenosis of the left M3 branch of the middle cerebral artery corresponding to the patient's symptoms. Carotid ultrasound was unremarkable. Transthoracic echo showed diminished ejection fraction of 30-35% without definite clot noted. Transesophageal echocardiogram showed no definite clot but showed severe global reduction in the left ventricular function. Patient underwent outpatient cardiac catheterization on 03/25/15 which confirmed low ejection fraction but did not show significant coronary artery stenosis. Hemoglobin A1c was elevated at 8.4. LDL cholesterol was 89 mg percent. Patient for 30 day outpatient cardiac monitor but the results of it are yet elevated. She was seen by Dr. Caryl Comes for consideration for loop recorder insertion but he plans to wait 2 months  and repeat her echocardiogram. Patient also states that she snores a lot and tires easily and has excessive daytime sleepiness and polysomnogram for detection of sleep apnea is being considered but not yet ordered. She states she's done well speech has recovered quite well. She still has occasional dizzy spells and feels her mental processing skills are slightly diminished. She has no other complaints. ROS:   14 system review of systems is positive for activity and appetite change, chills, fatigue, and drooling, shortness of breath, apnea, frequent waking, daytime sleepiness, snoring, itching, walking difficulty, joint pain, memory loss, weakness, confusion, decreased concentration, depression, nervousness and anxiety. PMH:  Past Medical History  Diagnosis Date  . Anxiety   . Depression   . Diabetes mellitus, type II ([place])   . PONV (postoperative nausea and vomiting)   . Cardiomyopathy [facility])     new to her Jan 2017  . HTN (hypertension)   . Obesity   . COPD (chronic obstructive pulmonary disease) ([place])   . Stroke [facility])     Jan 2017    Social History:  Social History   Social History  . Marital Status: Married    Spouse Name: N/A  . Number of Children: N/A  . Years of Education: N/A   Occupational History  . Not on file.   Social History Main Topics  . Smoking status: Current Every Day Smoker -- 1.00 packs/day for 35 years    Types: Cigarettes    Start date: 02/08/1974  . Smokeless tobacco: Never Used  . Alcohol Use: No  . Drug Use: No  . Sexual Activity: Yes    Birth Control/  Protection: None   Other Topics Concern  . Not on file   Social History Narrative    Medications:   Current Outpatient Prescriptions on File Prior to Visit  Medication Sig Dispense Refill  . Apremilast (OTEZLA) 30 MG TABS Take 30 mg by mouth 2 (two) times daily.    Marland Kitchen aspirin 325 MG tablet Take 1 tablet (325 mg total) by mouth daily. 30 tablet 0  . atorvastatin (LIPITOR) 10 MG tablet Take 1  tablet (10 mg total) by mouth daily. 90 tablet 3  . carvedilol (COREG) 6.25 MG tablet Take 1 tablet (6.25 mg total) by mouth 2 (two) times daily. 180 tablet 3  . clonazePAM (KLONOPIN) 0.5 MG tablet Take 1 tablet (0.5 mg total) by mouth at bedtime. (Patient taking differently: Take 0.5 mg by mouth at bedtime as needed for anxiety. ) 30 tablet 2  . diclofenac (VOLTAREN) 75 MG EC tablet TAKE 1 TABLET BY MOUTH TWICE A DAY AS NEEDED 60 tablet 3  . glyBURIDE-metformin (GLUCOVANCE) 5-500 MG per tablet Take 1 tablet by mouth 2 (two) times daily.    . Insulin Glargine (TOUJEO SOLOSTAR) 300 UNIT/ML SOPN Inject 40 Units into the skin every evening. (Patient taking differently: Inject 60 Units into the skin every evening. )    . loratadine (CLARITIN) 10 MG tablet Take 1 tablet by mouth daily.    Marland Kitchen losartan (COZAAR) 50 MG tablet TAKE 1 TABLET BY MOUTH EVERY DAY 30 tablet 6  . ONE TOUCH ULTRA TEST test strip     . ONETOUCH DELICA LANCETS FINE MISC     . PARoxetine (PAXIL) 40 MG tablet Take 1 tablet (40 mg total) by mouth daily. 90 tablet 3  . spironolactone (ALDACTONE) 25 MG tablet Take 0.5 tablets (12.5 mg total) by mouth daily. 45 tablet 3   No current facility-administered medications on file prior to visit.    Allergies:   Allergies  Allergen Reactions  . Codeine Nausea And Vomiting    Physical Exam General: well developed, well nourished, seated, in no evident distress Head: head normocephalic and atraumatic.  Neck: supple with no carotid or supraclavicular bruits Cardiovascular: regular rate and rhythm, no murmurs Musculoskeletal: no deformity Skin:  no rash/petichiae Vascular:  Normal pulses all extremities Filed Vitals:   04/23/15 1301  BP: 127/76  Pulse: 74   Neurologic Exam Mental Status: Awake and fully alert. Oriented to place and time. Recent and remote memory intact. Attention span, concentration and fund of knowledge appropriate. Mood and affect appropriate.  Cranial Nerves:  Fundoscopic exam reveals sharp disc margins. Pupils equal, briskly reactive to light. Extraocular movements full without nystagmus. Visual fields full to confrontation. Hearing intact. Facial sensation intact. Face, tongue, palate moves normally and symmetrically.  Motor: Normal bulk and tone. Normal strength in all tested extremity muscles.Diminished fine finger movements on the right. Orbits left over right upper extremity. Sensory.: intact to touch ,pinprick .position and vibratory sensation.  Coordination: Rapid alternating movements normal in all extremities. Finger-to-nose and heel-to-shin performed accurately bilaterally. Gait and Station: Arises from chair without difficulty. Stance is normal. Gait demonstrates normal stride length and balance . Able to heel, toe and tandem walk with moderate difficulty.  Reflexes: 1+ and symmetric. Toes downgoing.   NIHSS 0 Modified Rankin  1   ASSESSMENT: 33 year Caucasian lady with embolic left MCA branch infarct in December 2016 who is doing white well with significant improvement. Vascular risk factors of hypertension, hyperlipidemia, diabetes , low ejection fraction and suspected sleep  apnea    PLAN: I had a long d/w patient and daughter in law about her recent stroke, risk for recurrent stroke/TIAs, personally independently reviewed imaging studies and stroke evaluation results and answered questions.Continue aspirin 325 mg daily  for secondary stroke prevention and maintain strict control of hypertension with blood pressure goal below 130/90, diabetes with hemoglobin A1c goal below 6.5% and lipids with LDL cholesterol goal below 70 mg/dL. I also advised the patient to eat a healthy diet with plenty of whole grains, cereals, fruits and vegetables, exercise regularly and maintain ideal body weight . Check 4 week external heart monitor results and follow-up echocardiogram with her cardiologist. Check polysomnogram for sleep apnea. Greater than 50% of  time during this 25 minute visit was spent on counseling,explanation of diagnosis, planning of further management, discussion with patient and family and coordination of careFollowup in the future with stroke nurse practitioner in 6 months or call earlier if necessary.  Antony Contras, MD Note: This document was prepared with digital dictation and possible smart phrase technology. Any transcriptional errors that result from this process are unintentional

## 2015-04-24 ENCOUNTER — Ambulatory Visit (INDEPENDENT_AMBULATORY_CARE_PROVIDER_SITE_OTHER): Payer: Medicare Other | Admitting: Neurology

## 2015-04-24 ENCOUNTER — Encounter: Payer: Self-pay | Admitting: Neurology

## 2015-04-24 VITALS — BP 132/82 | HR 82 | Resp 20 | Ht 66.0 in | Wt 213.0 lb

## 2015-04-24 DIAGNOSIS — I639 Cerebral infarction, unspecified: Secondary | ICD-10-CM

## 2015-04-24 DIAGNOSIS — G4730 Sleep apnea, unspecified: Secondary | ICD-10-CM | POA: Diagnosis not present

## 2015-04-24 DIAGNOSIS — G47411 Narcolepsy with cataplexy: Secondary | ICD-10-CM

## 2015-04-24 DIAGNOSIS — I63312 Cerebral infarction due to thrombosis of left middle cerebral artery: Secondary | ICD-10-CM | POA: Diagnosis not present

## 2015-04-24 DIAGNOSIS — J441 Chronic obstructive pulmonary disease with (acute) exacerbation: Secondary | ICD-10-CM | POA: Diagnosis not present

## 2015-04-24 DIAGNOSIS — G4753 Recurrent isolated sleep paralysis: Secondary | ICD-10-CM | POA: Diagnosis not present

## 2015-04-24 DIAGNOSIS — R0683 Snoring: Secondary | ICD-10-CM | POA: Diagnosis not present

## 2015-04-24 DIAGNOSIS — G4710 Hypersomnia, unspecified: Secondary | ICD-10-CM

## 2015-04-24 DIAGNOSIS — J449 Chronic obstructive pulmonary disease, unspecified: Secondary | ICD-10-CM | POA: Insufficient documentation

## 2015-04-24 DIAGNOSIS — F17203 Nicotine dependence unspecified, with withdrawal: Secondary | ICD-10-CM

## 2015-04-24 DIAGNOSIS — F17200 Nicotine dependence, unspecified, uncomplicated: Secondary | ICD-10-CM | POA: Insufficient documentation

## 2015-04-24 NOTE — Progress Notes (Signed)
SLEEP MEDICINE CLINIC   Provider:  Larey Seat, M D  Referring Provider:  Dr Leonie Man, MD  Primary Care Physician:  Vernie Murders, PA  Chief Complaint  Patient presents with  . New Patient (Initial Visit)    dr. Leonie Man referral, snores, never had sleep study    HPI:  Jocelyn Wells is a 60 y.o. female , seen here as a referral from Dr. Leonie Man ,  This 60 year old Caucasian right-handed female is seen here after she suffered a stroke. She was seen on 01/29/2015 when she arrived at the emergency room for a stroke code was activated. She has a history of diabetes mellitus, obesity, hypertension and presented with sudden onset of right hand weakness clumsiness and numbness and some word finding difficulties. The NIH score was only 3 points and her symptoms were considered to mild to risk the side effects of potential TPA therapy and MRI of the brain documented a small left-sided infarction in the middle cerebral artery branch at the M3 branch.  This corresponded well with the patient's symptoms, carotid ultrasound was unremarkable, transthoracic echo showed a diminished ejection fraction of 30% without definite clotting. She then underwent a transesophageal echocardiogram which revealed again a very restricted ejection fraction and a severe global reduction in the left ventricular pump function. A cardiac catheter on 03-25-15: Frontal ejection fraction but ruled out the presence of significant coronary artery disease. She was seen by Dr. Virl Axe and [facility] who will see her again in about 2 months. When she saw Dr. Leonie Man she had mentioned that she snores a lot and he arranged in coordination with her cardiologist that her sleep study should be performed. She has signed up for smoking cessation and is interested in a weight loss program. She already lost 40 pounds over the last 7 month!.   Chief complaint according to patient : " I snore and gained weight, and I smoke and live with a smoker"   "I am sleepy ".   Sleep habits are as follows: The patient states that a lot of nights she falls asleep already watching TV on the sofa. In the late afternoon and early evening hours she is significantly more tired than prior to lunchtime. A lot of nights she feels that she cannot get good rest.. This has changed her sleep-wake rhythm. Most evening as she is watching TV or works on her computer. She is often up until midnight sometimes longer. If she hasn't falling asleep watching TV she will usually go to the bedroom at about midnight or later. Once and that she still may have trouble falling asleep and some nights she believes the bedroom again. If she falls asleep in bed she usually feels a asleep. She has bathroom breaks at night about 3 times. The first nocturia it at 4 and later 5 and 6 AM. The bedroom is cool, quiet and dark. She sleeps elevated on 2 pillows, she does not feel that she sleeps better in a reclined position but actually in bed. She prefers to sleep on her side. She shares no longer a bedroom with her husband, and goes to the guestroom. She portals excessive daytime sleepiness alternating with nocturnal insomnia problems. She reports also vivid dreams. She has had isolated sleep paralysis, she had isolated cataplectic attacks when emotionally upset angry.  She often takes her dog into the bedroom. She tries to get 6 hours of sleep. She feels better after 8 hours. She is daytime fatigued, short of breath, some  days she feels an irresistible urge to nap. This arose with the stroke and her cardiomyopathy. She is less exercise tolerant. She noted to be a sleepy child and student and having fallen asleep in the school classroom.    Sleep medical history and family sleep history:  She has full dentures , no natural teeth preserved.  Sinusitis , bronchitis, COPD - cardiomyotpathy , still had tonsils.  Social history:  Married smoker- 1 ppd for over 40 years. : iced tea 3- 4  a day, no  coffee, no sodas. No ETOH, but all day caffeine   Review of Systems: Out of a complete 14 system review, the patient complains of only the following symptoms, and all other reviewed systems are negative.  Irresistible urge to go to sleep in daytime, alternating with poor sleep at night, vivid dreams, sleep paralysis, cataplectic attacks, Epworth sleepiness score of 19 fatigue severity score 59 and geriatric depression score of 11 out of 15 possible points. Further endorsed depression, anxiety, decreased energy fatigability decreased exercise tolerance with shortness of breath, sleepiness, snoring memory loss confusion headaches blurred vision easy bruising, aching muscles, allergies. Surgical history for hysterectomy in the year 2000 for fractures of the ankle in 2002 and 2003, and all non-nerve transposition in 2008. Positive further for diabetes, hypercholesterolemia, heart disease, cardiomyopathy, reduced ejection fraction, migraine, depression, anxiety  Epworth score 19 .   Social History   Social History  . Marital Status: Married    Spouse Name: N/A  . Number of Children: N/A  . Years of Education: N/A   Occupational History  . Not on file.   Social History Main Topics  . Smoking status: Current Every Day Smoker -- 1.00 packs/day for 35 years    Types: Cigarettes    Start date: 02/08/1974  . Smokeless tobacco: Never Used  . Alcohol Use: No  . Drug Use: No  . Sexual Activity: Yes    Birth Control/ Protection: None   Other Topics Concern  . Not on file   Social History Narrative    Family History  Problem Relation Age of Onset  . Heart disease Mother     died from CHF  . Asthma Mother   . Diabetes Mother   . Heart disease Father   . Aneurysm Father   . Diabetes Brother   . Alcohol abuse Paternal Aunt     Past Medical History  Diagnosis Date  . Anxiety   . Depression   . Diabetes mellitus, type II ([place])   . PONV (postoperative nausea and vomiting)   .  Cardiomyopathy [facility])     new to her Jan 2017  . HTN (hypertension)   . Obesity   . COPD (chronic obstructive pulmonary disease) ([place])   . Stroke [facility])     Jan 2017    Past Surgical History  Procedure Laterality Date  . Abdominal hysterectomy    . Tubal ligation  1978  . Cesarean section  1978  . Knee arthroscopy Left 2005  . Ulnar nerve transposition  2008  . Bilateral salpingoophorectomy  2000  . Ankle fracture surgery Left 2002  . Tee without cardioversion N/A 01/31/2015    Procedure: TRANSESOPHAGEAL ECHOCARDIOGRAM (TEE);  Surgeon: Lelon Perla, MD;  Location: [facility] ENDOSCOPY;  Service: Cardiovascular;  Laterality: N/A;  . Cardiac catheterization N/A 03/25/2015    Procedure: Right/Left Heart Cath and Coronary Angiography;  Surgeon: Belva Crome, MD;  Location: [place] CV LAB;  Service: Cardiovascular;  Laterality: N/A;  Current Outpatient Prescriptions  Medication Sig Dispense Refill  . Apremilast (OTEZLA) 30 MG TABS Take 30 mg by mouth 2 (two) times daily.    Marland Kitchen aspirin 325 MG tablet Take 1 tablet (325 mg total) by mouth daily. 30 tablet 0  . atorvastatin (LIPITOR) 10 MG tablet Take 1 tablet (10 mg total) by mouth daily. 90 tablet 3  . carvedilol (COREG) 6.25 MG tablet Take 1 tablet (6.25 mg total) by mouth 2 (two) times daily. 180 tablet 3  . clonazePAM (KLONOPIN) 0.5 MG tablet Take 1 tablet (0.5 mg total) by mouth at bedtime. (Patient taking differently: Take 0.5 mg by mouth at bedtime as needed for anxiety. ) 30 tablet 2  . diclofenac (VOLTAREN) 75 MG EC tablet TAKE 1 TABLET BY MOUTH TWICE A DAY AS NEEDED 60 tablet 3  . glyBURIDE-metformin (GLUCOVANCE) 5-500 MG per tablet Take 1 tablet by mouth 2 (two) times daily.    . Insulin Glargine (TOUJEO SOLOSTAR) 300 UNIT/ML SOPN Inject 40 Units into the skin every evening. (Patient taking differently: Inject 60 Units into the skin every evening. )    . loratadine (CLARITIN) 10 MG tablet Take 1 tablet by mouth daily.    Marland Kitchen  losartan (COZAAR) 50 MG tablet TAKE 1 TABLET BY MOUTH EVERY DAY 30 tablet 6  . ONE TOUCH ULTRA TEST test strip     . ONETOUCH DELICA LANCETS FINE MISC     . PARoxetine (PAXIL) 40 MG tablet Take 1 tablet (40 mg total) by mouth daily. 90 tablet 3  . spironolactone (ALDACTONE) 25 MG tablet Take 0.5 tablets (12.5 mg total) by mouth daily. 45 tablet 3   No current facility-administered medications for this visit.    Allergies as of 04/24/2015 - Review Complete 04/24/2015  Allergen Reaction Noted  . Codeine Nausea And Vomiting 07/05/2014    Vitals: BP 132/82 mmHg  Pulse 82  Resp 20  Ht 5\' 6"  (1.676 m)  Wt 213 lb (96.616 kg)  BMI 34.40 kg/m2 Last Weight:  Wt Readings from Last 1 Encounters:  04/24/15 213 lb (96.616 kg)   [id number] mass index is 34.4 kg/(m^2).     Last Height:   Ht Readings from Last 1 Encounters:  04/24/15 5\' 6"  (1.676 m)    Physical exam:  General: The patient is awake, alert and appears not in acute distress. The patient is well groomed. Head: Normocephalic, atraumatic. Neck is supple. Mallampati 4,  neck circumference: 16 . edentulous . Retrognathia is seen.  Cardiovascular:  Regular rate and rhythm, without  murmurs or carotid bruit, and without distended neck veins. Respiratory: Lungs are clear to auscultation. Skin:  Without evidence of edema, or rash Trunk: BMI is elevated Neurologic exam : The patient is awake and alert, oriented to place and time.   Memory subjective  described as intact     Attention span & concentration ability appears normal.  Speech is fluent,  without  dysarthria, dysphonia and I noted no  aphasia.  Mood and affect are appropriate.  Cranial nerves: Pupils are equal and briskly reactive to light.  Funduscopic exam without evidence of pallor or edema. Extraocular movements  in vertical and horizontal planes intact and without nystagmus. Visual fields by finger perimetry are intact. Hearing to finger rub intact.   Facial sensation  intact to fine touch.  Facial motor strength is symmetric and tongue and uvula move midline. Shoulder shrug was symmetrical.   Motor exam:  Normal tone, muscle bulk and symmetric strength in all  extremities. Arthritis in Knee, ankle and back.   Sensory:  Fine touch, pinprick and vibration were tested in all extremities. Proprioception tested in the upper extremities was normal.  Coordination: Rapid alternating movements in the fingers/hands was normal. Finger-to-nose maneuver  normal without evidence of ataxia, dysmetria or tremor.  Gait and station: Patient walks with a cane for  assistive device and is able unassisted to climb up to the exam table. Strength within normal limits.  Stance is stable and normal.  Turns with 4 Steps. Romberg testing is  negative.  Deep tendon reflexes: in the  upper and lower extremities are symmetric and intact. The patient was advised of the nature of the diagnosed sleep disorder , the treatment options and risks for general a health and wellness arising from not treating the condition.  I spent more than  55 minutes of face to face time with the patient. Greater than 50% of time was spent in counseling and coordination of care. We have discussed the diagnosis and differential and I answered the patient's questions.     Assessment:  After physical and neurologic examination, review of laboratory studies,  Personal review of imaging studies, reports of other /same  Imaging studies ,  Results of polysomnography/ neurophysiology testing and pre-existing records as far as provided in visit., my assessment is   1) Jocelyn Wells presents as a smoker, with obesity, cardiomyopathy and a low ejection fraction, hypertension and diabetes after a very recent stroke. She has high risk factors for obstructive sleep apnea including body mass index, neck circumference and Mallampati.   She suffers from excessive daytime sleepiness likely related to sleep apnea but also high  degree of fatigue that could be attributed to her cardiomyopathy.  2) JocelynS. was also described visit nocturnal dreams, sleep paralysis in the morning, and has at times suffered cataplectic attacks in response to emotional upset. Looking at her Epworth score of 19 points we will have to make sure that she may not suffer from narcolepsy with cataplexy. She herself has already wondered if she may have narcolepsy. There is irresistible urge to sleep in daytime and this fragmentation of sleep but visit dreams.  3) poor sleep hygiene with not defined bedtimes and routines. I would like for the patient to try to declare 12:00 the latest she will go to bed. That will help her to get more hours of sleep, it will also help her heart to have a routine and rhythm for daytime sleep time. If she needs to take a daytime nap but should not exceed 30 minutes and be more of a power nap.  I'm planning to invite the patient for a split night polysomnography with capnography. If the patient remains hypoxemic during the sleep study without significant apnea or at least not significant enough to be split, I would like to sleep attacks to try to apply oxygen and documented reaction. In addition to make a follow-up visit within 30 days after her sleep study. [place] copy will be sent to Dr. Virl Axe, and to Dr. Antony Contras ,   CD.        Asencion Partridge Ohn Bostic MD  04/24/2015   CC: [address], [address] [place], [place] [address]

## 2015-04-24 NOTE — Patient Instructions (Signed)
Sleep Apnea  Sleep apnea is a sleep disorder characterized by abnormal pauses in breathing while you sleep. When your breathing pauses, the level of oxygen in your blood decreases. This causes you to move out of deep sleep and into light sleep. As a result, your quality of sleep is poor, and the system that carries your blood throughout your body (cardiovascular system) experiences stress. If sleep apnea remains untreated, the following conditions can develop:  High blood pressure (hypertension).  Coronary artery disease.  Inability to achieve or maintain an erection (impotence).  Impairment of your thought process (cognitive dysfunction). There are three types of sleep apnea: 1. Obstructive sleep apnea--Pauses in breathing during sleep because of a blocked airway. 2. Central sleep apnea--Pauses in breathing during sleep because the area of the brain that controls your breathing does not send the correct signals to the muscles that control breathing. 3. Mixed sleep apnea--A combination of both obstructive and central sleep apnea. RISK FACTORS The following risk factors can increase your risk of developing sleep apnea:  Being overweight.  Smoking.  Having narrow passages in your nose and throat.  Being of older age.  Being female.  Alcohol use.  Sedative and tranquilizer use.  Ethnicity. Among individuals younger than 35 years, African Americans are at increased risk of sleep apnea. SYMPTOMS   Difficulty staying asleep.  Daytime sleepiness and fatigue.  Loss of energy.  Irritability.  Loud, heavy snoring.  Morning headaches.  Trouble concentrating.  Forgetfulness.  Decreased interest in sex.  Unexplained sleepiness. DIAGNOSIS  In order to diagnose sleep apnea, your caregiver will perform a physical examination. A sleep study done in the comfort of your own home may be appropriate if you are otherwise healthy. Your caregiver may also recommend that you spend the  night in a sleep lab. In the sleep lab, several monitors record information about your heart, lungs, and brain while you sleep. Your leg and arm movements and blood oxygen level are also recorded. TREATMENT The following actions may help to resolve mild sleep apnea:  Sleeping on your side.   Using a decongestant if you have nasal congestion.   Avoiding the use of depressants, including alcohol, sedatives, and narcotics.   Losing weight and modifying your diet if you are overweight. There also are devices and treatments to help open your airway:  Oral appliances. These are custom-made mouthpieces that shift your lower jaw forward and slightly open your bite. This opens your airway.  Devices that create positive airway pressure. This positive pressure "splints" your airway open to help you breathe better during sleep. The following devices create positive airway pressure:  Continuous positive airway pressure (CPAP) device. The CPAP device creates a continuous level of air pressure with an air pump. The air is delivered to your airway through a mask while you sleep. This continuous pressure keeps your airway open.  Nasal expiratory positive airway pressure (EPAP) device. The EPAP device creates positive air pressure as you exhale. The device consists of single-use valves, which are inserted into each nostril and held in place by adhesive. The valves create very little resistance when you inhale but create much more resistance when you exhale. That increased resistance creates the positive airway pressure. This positive pressure while you exhale keeps your airway open, making it easier to breath when you inhale again.  Bilevel positive airway pressure (BPAP) device. The BPAP device is used mainly in patients with central sleep apnea. This device is similar to the CPAP device because   it also uses an air pump to deliver continuous air pressure through a mask. However, with the BPAP machine, the  pressure is set at two different levels. The pressure when you exhale is lower than the pressure when you inhale.  Surgery. Typically, surgery is only done if you cannot comply with less invasive treatments or if the less invasive treatments do not improve your condition. Surgery involves removing excess tissue in your airway to create a wider passage way.   This information is not intended to replace advice given to you by your health care provider. Make sure you discuss any questions you have with your health care provider.   Document Released: 01/15/2002 Document Revised: 02/15/2014 Document Reviewed: 06/03/2011 Elsevier Interactive Patient Education 2016 Elsevier Inc.   

## 2015-04-29 ENCOUNTER — Encounter: Payer: Self-pay | Admitting: *Deleted

## 2015-05-08 ENCOUNTER — Ambulatory Visit (INDEPENDENT_AMBULATORY_CARE_PROVIDER_SITE_OTHER): Payer: Medicare Other | Admitting: Physician Assistant

## 2015-05-08 ENCOUNTER — Encounter: Payer: Self-pay | Admitting: Physician Assistant

## 2015-05-08 ENCOUNTER — Ambulatory Visit (INDEPENDENT_AMBULATORY_CARE_PROVIDER_SITE_OTHER): Payer: Medicare Other | Admitting: Neurology

## 2015-05-08 VITALS — BP 113/71 | HR 81 | Ht 66.0 in | Wt 207.8 lb

## 2015-05-08 DIAGNOSIS — I639 Cerebral infarction, unspecified: Secondary | ICD-10-CM

## 2015-05-08 DIAGNOSIS — G4753 Recurrent isolated sleep paralysis: Secondary | ICD-10-CM

## 2015-05-08 DIAGNOSIS — I509 Heart failure, unspecified: Secondary | ICD-10-CM | POA: Diagnosis not present

## 2015-05-08 DIAGNOSIS — I63312 Cerebral infarction due to thrombosis of left middle cerebral artery: Secondary | ICD-10-CM

## 2015-05-08 DIAGNOSIS — R0683 Snoring: Secondary | ICD-10-CM

## 2015-05-08 DIAGNOSIS — J42 Unspecified chronic bronchitis: Secondary | ICD-10-CM

## 2015-05-08 DIAGNOSIS — G4710 Hypersomnia, unspecified: Secondary | ICD-10-CM

## 2015-05-08 DIAGNOSIS — I10 Essential (primary) hypertension: Secondary | ICD-10-CM

## 2015-05-08 DIAGNOSIS — F17203 Nicotine dependence unspecified, with withdrawal: Secondary | ICD-10-CM

## 2015-05-08 DIAGNOSIS — I429 Cardiomyopathy, unspecified: Secondary | ICD-10-CM | POA: Diagnosis not present

## 2015-05-08 DIAGNOSIS — G47411 Narcolepsy with cataplexy: Secondary | ICD-10-CM | POA: Diagnosis not present

## 2015-05-08 DIAGNOSIS — I5022 Chronic systolic (congestive) heart failure: Secondary | ICD-10-CM | POA: Diagnosis not present

## 2015-05-08 DIAGNOSIS — I428 Other cardiomyopathies: Secondary | ICD-10-CM

## 2015-05-08 DIAGNOSIS — G4730 Sleep apnea, unspecified: Secondary | ICD-10-CM | POA: Diagnosis not present

## 2015-05-08 DIAGNOSIS — J441 Chronic obstructive pulmonary disease with (acute) exacerbation: Secondary | ICD-10-CM

## 2015-05-08 MED ORDER — CARVEDILOL 12.5 MG PO TABS: 12.5000 mg | ORAL_TABLET | Freq: Two times a day (BID) | ORAL | Status: DC

## 2015-05-08 NOTE — Patient Instructions (Signed)
Medication Instructions:  Your physician has recommended you make the following change in your medication:  INCREASE coreg to 12.5mg  twice daily   Labwork: none  Testing/Procedures: Your physician has requested that you have an echocardiogram. Echocardiography is a painless test that uses sound waves to create images of your heart. It provides your doctor with information about the size and shape of your heart and how well your heart's chambers and valves are working. This procedure takes approximately one hour. There are no restrictions for this procedure.    Follow-Up: Your physician recommends that you schedule a follow-up appointment with Dr. Caryl Comes  after echo in May .   Any Other Special Instructions Will Be Listed Below (If Applicable).     If you need a refill on your cardiac medications before your next appointment, please call your pharmacy.  Echocardiogram An echocardiogram, or echocardiography, uses sound waves (ultrasound) to produce an image of your heart. The echocardiogram is simple, painless, obtained within a short period of time, and offers valuable information to your health care provider. The images from an echocardiogram can provide information such as:  Evidence of coronary artery disease (CAD).  Heart size.  Heart muscle function.  Heart valve function.  Aneurysm detection.  Evidence of a past heart attack.  Fluid buildup around the heart.  Heart muscle thickening.  Assess heart valve function. LET [facility] CARE PROVIDER KNOW ABOUT:  Any allergies you have.  All medicines you are taking, including vitamins, herbs, eye drops, creams, and over-the-counter medicines.  Previous problems you or members of your family have had with the use of anesthetics.  Any blood disorders you have.  Previous surgeries you have had.  Medical conditions you have.  Possibility of pregnancy, if this applies. BEFORE THE PROCEDURE  No special preparation  is needed. Eat and drink normally.  PROCEDURE   In order to produce an image of your heart, gel will be applied to your chest and a wand-like tool (transducer) will be moved over your chest. The gel will help transmit the sound waves from the transducer. The sound waves will harmlessly bounce off your heart to allow the heart images to be captured in real-time motion. These images will then be recorded.  You may need an IV to receive a medicine that improves the quality of the pictures. AFTER THE PROCEDURE You may return to your normal schedule including diet, activities, and medicines, unless your health care provider tells you otherwise.   This information is not intended to replace advice given to you by your health care provider. Make sure you discuss any questions you have with your health care provider.   Document Released: 01/23/2000 Document Revised: 02/15/2014 Document Reviewed: 10/02/2012 Elsevier Interactive Patient Education [employer].

## 2015-05-08 NOTE — Progress Notes (Signed)
Cardiology Office Note Date:  05/08/2015  Patient ID:  Jocelyn Wells, Jocelyn Wells 12/01/1955, MRN [id number] PCP:  Vernie Murders, PA  Cardiologist:  Dr. Caryl Comes, MD    Chief Complaint: Follow up  History of Present Illness: Jocelyn Wells is a 60 y.o. female with history of nonischemic cardiomyopathy by cardiac cath 03/2015, cryptogenic stroke 01/29/2015, HTN, DM, obesity, tobacco abuse, and COPD who presents for routine follow up of her CM.   She was found to have a cardiomyopathy in her stroke work-up. EP was asked to evaluate her for possible ILR. TTE (01/30/2015) showed EF 30-35%, mild LVH, diffuse HK, GR2DD, left atrium mildly dilated, trace MR/TR, small pericardial effusion. Though, given her cardiomyopathy her new finding of cardiomyopathy event monitor was felt to be her best initial evaluation. Event monitor showed NSR, rare PVC's, noise artifact, no sustained atrial or ventricular arrhythmias. Carotid dopper showed bilateral 1-39% bilateral ICA stenosis. TEE (01/31/2015) showed EF 25-30%, no valvular vegetations, trival AI, mild MR, LA mildly dilated without evidence of thrombus in the atrial cavity or appendage, right atrium without evidence of clot in the cavity or appendage, no atrial septal defect or patent foramen ovale, small pericardial effusion. She underwent right and left cardiac catheterization on 03/25/2015 that showed normal coronary arteries, severe systolic LV dysfunction with EF 25-30%, mildly elevated LVEDP and moderate pulmonary capillary wedge elevation of 26 mm Hg. She has since started optimization of her heart failure medications in an effort in improve her EF with planned repeat echo. Sleep study is scheduled.  Today, she reports feeling well. She is SOB with ambulation, though this is improving. She is tolerating all of her medications without issues. Her weight is down 6 pounds from her last office visit. No lower extremity edema, stable 2-pillow orthopnea, no early satiety,  PND, palpitations, or chest pain. She is scheduled for her sleep study this evening.    Past Medical History  Diagnosis Date  . Anxiety   . Depression   . Diabetes mellitus, type II ([place])   . PONV (postoperative nausea and vomiting)   . Cardiomyopathy [facility])     new to her Jan 2017  . HTN (hypertension)   . Obesity   . COPD (chronic obstructive pulmonary disease) ([place])   . Stroke [facility])     Jan 2017    Past Surgical History  Procedure Laterality Date  . Abdominal hysterectomy    . Tubal ligation  1978  . Cesarean section  1978  . Knee arthroscopy Left 2005  . Ulnar nerve transposition  2008  . Bilateral salpingoophorectomy  2000  . Ankle fracture surgery Left 2002  . Tee without cardioversion N/A 01/31/2015    Procedure: TRANSESOPHAGEAL ECHOCARDIOGRAM (TEE);  Surgeon: Lelon Perla, MD;  Location: [facility] ENDOSCOPY;  Service: Cardiovascular;  Laterality: N/A;  . Cardiac catheterization N/A 03/25/2015    Procedure: Right/Left Heart Cath and Coronary Angiography;  Surgeon: Belva Crome, MD;  Location: [place] CV LAB;  Service: Cardiovascular;  Laterality: N/A;    Current Outpatient Prescriptions  Medication Sig Dispense Refill  . Apremilast (OTEZLA) 30 MG TABS Take 30 mg by mouth 2 (two) times daily.    Marland Kitchen aspirin 325 MG tablet Take 1 tablet (325 mg total) by mouth daily. 30 tablet 0  . atorvastatin (LIPITOR) 10 MG tablet Take 1 tablet (10 mg total) by mouth daily. 90 tablet 3  . carvedilol (COREG) 12.5 MG tablet Take 1 tablet (12.5 mg total) by mouth 2 (two)  times daily. 60 tablet 6  . clonazePAM (KLONOPIN) 0.5 MG tablet Take 1 tablet (0.5 mg total) by mouth at bedtime. (Patient taking differently: Take 0.5 mg by mouth at bedtime as needed for anxiety. ) 30 tablet 2  . diclofenac (VOLTAREN) 75 MG EC tablet TAKE 1 TABLET BY MOUTH TWICE A DAY AS NEEDED 60 tablet 3  . glyBURIDE-metformin (GLUCOVANCE) 5-500 MG per tablet Take 2 tablets by mouth 2 (two) times daily.     . Insulin  Glargine (TOUJEO SOLOSTAR) 300 UNIT/ML SOPN Inject 40 Units into the skin every evening. (Patient taking differently: Inject 60 Units into the skin every evening. )    . loratadine (CLARITIN) 10 MG tablet Take 1 tablet by mouth daily.    Marland Kitchen losartan (COZAAR) 50 MG tablet TAKE 1 TABLET BY MOUTH EVERY DAY 30 tablet 6  . ONE TOUCH ULTRA TEST test strip     . ONETOUCH DELICA LANCETS FINE MISC     . PARoxetine (PAXIL) 40 MG tablet Take 1 tablet (40 mg total) by mouth daily. 90 tablet 3  . spironolactone (ALDACTONE) 25 MG tablet Take 0.5 tablets (12.5 mg total) by mouth daily. 45 tablet 3   No current facility-administered medications for this visit.    Allergies:   Codeine   Social History:  The patient  reports that she has been smoking Cigarettes.  She started smoking about 41 years ago. She has a 35 pack-year smoking history. She has never used smokeless tobacco. She reports that she does not drink alcohol or use illicit drugs.   Family History:  The patient's family history includes Alcohol abuse in her paternal aunt; Aneurysm in her father; Asthma in her mother; Diabetes in her brother and mother; Heart disease in her father and mother.  ROS:   Review of Systems  Constitutional: Positive for weight loss and malaise/fatigue. Negative for fever, chills and diaphoresis.  HENT: Negative for congestion.   Eyes: Negative for discharge and redness.  Respiratory: Positive for shortness of breath. Negative for cough, hemoptysis, sputum production and wheezing.   Cardiovascular: Negative for chest pain, palpitations, orthopnea, claudication, leg swelling and PND.  Gastrointestinal: Negative for nausea, vomiting and abdominal pain.  Musculoskeletal: Negative for myalgias and falls.  Skin: Negative for rash.  Neurological: Negative for sensory change, speech change, focal weakness, loss of consciousness and weakness.  Endo/Heme/Allergies: Does not bruise/bleed easily.  Psychiatric/Behavioral: The  patient is not nervous/anxious.   All other systems reviewed and are negative.     PHYSICAL EXAM:  VS:  BP 113/71 mmHg  Pulse 81  Ht 5\' 6"  (1.676 m)  Wt 207 lb 12 oz (94.235 kg)  BMI 33.55 kg/m2 BMI: Body mass index is 33.55 kg/(m^2). Well nourished, well developed, in no acute distress HEENT: normocephalic, atraumatic Neck: no JVD, carotid bruits or masses Cardiac:  normal S1, S2; RRR; no murmurs, rubs, or gallops Lungs:  clear to auscultation bilaterally, no wheezing, rhonchi or rales Abd: soft, nontender, no hepatomegaly, + BS MS: no deformity or atrophy Ext: no edema Skin: warm and dry, no rash Neuro:  moves all extremities spontaneously, no focal abnormalities noted, follows commands Psych: euthymic mood, full affect   EKG:  Was not ordered today.  Recent Labs: 02/11/2015: ALT 20 03/21/2015: Hemoglobin 11.8*; Platelets 177 04/22/2015: BUN 27*; Creatinine, Ser 1.30*; Potassium 4.2; Sodium 135  01/30/2015: Cholesterol 158; HDL 33*; LDL Cholesterol 89; Total CHOL/HDL Ratio 4.8; Triglycerides 182*; VLDL 36   Estimated Creatinine Clearance: 52.6 mL/min (by C-G  formula based on Cr of 1.3).   Wt Readings from Last 3 Encounters:  05/08/15 207 lb 12 oz (94.235 kg)  04/24/15 213 lb (96.616 kg)  04/23/15 212 lb (96.163 kg)     Other studies reviewed: Additional studies/records reviewed today include: summarized above  ASSESSMENT AND PLAN:  1. NICM/chronic systolic CHF: Not volume overloaded on exam today. Not applying salt or eating salty foods. Limiting her PO fluid consumption. Titrate Coreg to 12.5 mg bid. Continue losartan and spironolactone. Schedule echo for first week of May to assess LV systolic function. If this remains below 35% she will need ICD.    2. Cryptogenic stroke: Followed by neurology. Event monitor without arrhythmia. Follow up with EP. On aspirin.   3. HTN: Stable. Continue current medications.   4. COPD: Stable. No acute exacerbation. Per PCP.    5. DM: Per PCP  Disposition: F/u with Dr. Caryl Comes after the above echo in May  Current medicines are reviewed at length with the patient today.  The patient did not have any concerns regarding medicines.  Melvern Banker PA-C 05/08/2015 2:48 PM     [place] [address] [place], [place] [address] [phone]

## 2015-05-09 NOTE — Sleep Study (Signed)
Please see the scanned sleep study interpretation located in the procedure tab in the chart view section.  

## 2015-05-13 ENCOUNTER — Telehealth: Payer: Self-pay

## 2015-05-13 NOTE — Telephone Encounter (Signed)
Spoke to pt regarding her sleep study results. I advised her that her sleep study revealed normal sleep and no evidence of insomnia. There were constant desaturations of oxygen below 90 %. Dr. Brett Fairy recommends HLA testing for narcolepsy. Pt asked for an office visit to discuss further treatment and work up for daytime sleepiness. An appt was made for 5/4 at 1:30. Pt verbalized understanding.

## 2015-05-13 NOTE — Telephone Encounter (Signed)
I called pt to discuss sleep study results. No answer, left a message asking her to call me back. 

## 2015-05-29 ENCOUNTER — Institutional Professional Consult (permissible substitution): Payer: Medicare Other | Admitting: Internal Medicine

## 2015-06-01 ENCOUNTER — Other Ambulatory Visit: Payer: Self-pay | Admitting: Family Medicine

## 2015-06-02 ENCOUNTER — Emergency Department: Payer: Medicare Other

## 2015-06-02 ENCOUNTER — Encounter: Payer: Self-pay | Admitting: *Deleted

## 2015-06-02 ENCOUNTER — Emergency Department
Admission: EM | Admit: 2015-06-02 | Discharge: 2015-06-02 | Disposition: A | Discharge status: Home / Self Care | Payer: Medicare Other | Attending: Emergency Medicine | Admitting: Emergency Medicine

## 2015-06-02 DIAGNOSIS — Z794 Long term (current) use of insulin: Secondary | ICD-10-CM | POA: Diagnosis not present

## 2015-06-02 DIAGNOSIS — E119 Type 2 diabetes mellitus without complications: Secondary | ICD-10-CM | POA: Diagnosis not present

## 2015-06-02 DIAGNOSIS — M1990 Unspecified osteoarthritis, unspecified site: Secondary | ICD-10-CM | POA: Insufficient documentation

## 2015-06-02 DIAGNOSIS — Z7982 Long term (current) use of aspirin: Secondary | ICD-10-CM | POA: Insufficient documentation

## 2015-06-02 DIAGNOSIS — Z7984 Long term (current) use of oral hypoglycemic drugs: Secondary | ICD-10-CM | POA: Insufficient documentation

## 2015-06-02 DIAGNOSIS — F17210 Nicotine dependence, cigarettes, uncomplicated: Secondary | ICD-10-CM | POA: Diagnosis not present

## 2015-06-02 DIAGNOSIS — I110 Hypertensive heart disease with heart failure: Secondary | ICD-10-CM | POA: Insufficient documentation

## 2015-06-02 DIAGNOSIS — I429 Cardiomyopathy, unspecified: Secondary | ICD-10-CM | POA: Diagnosis not present

## 2015-06-02 DIAGNOSIS — I5022 Chronic systolic (congestive) heart failure: Secondary | ICD-10-CM | POA: Diagnosis not present

## 2015-06-02 DIAGNOSIS — Z8673 Personal history of transient ischemic attack (TIA), and cerebral infarction without residual deficits: Secondary | ICD-10-CM | POA: Insufficient documentation

## 2015-06-02 DIAGNOSIS — R079 Chest pain, unspecified: Secondary | ICD-10-CM | POA: Diagnosis not present

## 2015-06-02 DIAGNOSIS — E785 Hyperlipidemia, unspecified: Secondary | ICD-10-CM | POA: Diagnosis not present

## 2015-06-02 DIAGNOSIS — J449 Chronic obstructive pulmonary disease, unspecified: Secondary | ICD-10-CM | POA: Insufficient documentation

## 2015-06-02 DIAGNOSIS — R0789 Other chest pain: Secondary | ICD-10-CM | POA: Diagnosis not present

## 2015-06-02 DIAGNOSIS — Z79899 Other long term (current) drug therapy: Secondary | ICD-10-CM | POA: Insufficient documentation

## 2015-06-02 DIAGNOSIS — F329 Major depressive disorder, single episode, unspecified: Secondary | ICD-10-CM | POA: Insufficient documentation

## 2015-06-02 LAB — BASIC METABOLIC PANEL
ANION GAP: 10 (ref 5–15)
BUN: 38 mg/dL — ABNORMAL HIGH (ref 6–20)
CHLORIDE: 104 mmol/L (ref 101–111)
CO2: 23 mmol/L (ref 22–32)
Calcium: 8.9 mg/dL (ref 8.9–10.3)
Creatinine, Ser: 1.68 mg/dL — ABNORMAL HIGH (ref 0.44–1.00)
GFR calc Af Amer: 37 mL/min — ABNORMAL LOW (ref >60)
GFR, EST NON AFRICAN AMERICAN: 32 mL/min — AB (ref >60)
Glucose, Bld: 273 mg/dL — ABNORMAL HIGH (ref 65–99)
POTASSIUM: 4.7 mmol/L (ref 3.5–5.1)
SODIUM: 137 mmol/L (ref 135–145)

## 2015-06-02 LAB — CBC
HEMATOCRIT: 36.4 % (ref 35.0–47.0)
HEMOGLOBIN: 12.2 g/dL (ref 12.0–16.0)
MCH: 28.5 pg (ref 26.0–34.0)
MCHC: 33.7 g/dL (ref 32.0–36.0)
MCV: 84.8 fL (ref 80.0–100.0)
Platelets: 172 10*3/uL (ref 150–440)
RBC: 4.29 MIL/uL (ref 3.80–5.20)
RDW: 14.9 % — ABNORMAL HIGH (ref 11.5–14.5)
WBC: 12.1 10*3/uL — AB (ref 3.6–11.0)

## 2015-06-02 LAB — TROPONIN I: Troponin I: <0.03 ng/mL (ref <0.031)

## 2015-06-02 LAB — BRAIN NATRIURETIC PEPTIDE: B NATRIURETIC PEPTIDE 5: 177 pg/mL — AB (ref 0.0–100.0)

## 2015-06-02 MED ORDER — ASPIRIN 81 MG PO CHEW
324.0000 mg | CHEWABLE_TABLET | Freq: Once | ORAL | Status: AC
  Administered 2015-06-02: 324 mg via ORAL
  Filled 2015-06-02: qty 4

## 2015-06-02 MED ORDER — SODIUM CHLORIDE 0.9 % IV BOLUS (SEPSIS)
500.0000 mL | Freq: Once | INTRAVENOUS | Status: AC
  Administered 2015-06-02: 250 mL via INTRAVENOUS

## 2015-06-02 MED ORDER — GI COCKTAIL ~~LOC~~
30.0000 mL | Freq: Once | ORAL | Status: AC
  Administered 2015-06-02: 30 mL via ORAL
  Filled 2015-06-02: qty 30

## 2015-06-02 NOTE — ED Notes (Signed)
Dr Burlene Arnt in with pt.

## 2015-06-02 NOTE — ED Notes (Signed)
Report to rebecca, rn.  

## 2015-06-02 NOTE — ED Notes (Signed)
Pt c/o chest pain, palpitations, dyspnea, weakness and dizziness. Pt repeatedly states that she "just didn't feel like herself". Pt has stroke hx and cardiac cath.

## 2015-06-02 NOTE — Discharge Instructions (Signed)

## 2015-06-02 NOTE — ED Provider Notes (Signed)
[facility] Emergency Department Provider Note  ____________________________________________  Time seen: Approximately [id number] AM  I have reviewed the triage vital signs and the nursing notes.   HISTORY  Chief Complaint Chest Pain    HPI Jocelyn Wells is a 60 y.o. female comes into the hospital today with chest pain. She reports that she just didn't feel right today. She felt as though her heart was racing. The patient was laying down when this pain started. It started around midnight and she reports that the pain was dull. She has been told that she has a weak heart. She did not take anything for pain as she had a ready taken her medications. The patient had some shortness of breath with dizziness but denies nausea or vomiting or radiation of the pain. She rates her pain a 5 out of 10 in intensity at this time and reports it does come and go. Nothing seems to make the pain worse or better and the patient reports that she has had this pain before. The patient sees Dr. Caryl Comes with cardiology and reports that she's had a catheter before as well. The patient is here for evaluation and treatment of her chest pain.   Past Medical History  Diagnosis Date  . Anxiety   . Depression   . Diabetes mellitus, type II ([place])   . PONV (postoperative nausea and vomiting)   . Cardiomyopathy [facility])     new to her Jan 2017  . HTN (hypertension)   . Obesity   . COPD (chronic obstructive pulmonary disease) ([place])   . Stroke [facility])     Jan 2017    Patient Active Problem List   Diagnosis Date Noted  . Nicotine dependence 04/24/2015  . Snoring 04/24/2015  . Sleep paralysis, recurrent isolated 04/24/2015  . Hypersomnia with sleep apnea 04/24/2015  . Cataplexy 04/24/2015  . Morbid obesity due to excess calories ([place]) 04/24/2015  . COPD exacerbation ([place]) 04/24/2015  . Cerebrovascular accident (CVA) due to thrombosis of left middle cerebral artery ([place]) 04/24/2015  . Embolic stroke  ([place]) 99991111  . Fatigue 04/23/2015  . Type 2 diabetes mellitus ([place]) 04/07/2015  . Chronic systolic heart failure ([place]) 03/25/2015  . Stroke ([place]) 01/31/2015  . Nausea & vomiting 01/29/2015  . AKI (acute kidney injury) ([place]) 01/29/2015  . CVA (cerebral infarction) 01/29/2015  . Stroke (cerebrum) ([place]) 01/29/2015  . Dizziness and giddiness 07/05/2014  . Blood pressure elevated 07/05/2014  . Carbuncle and furuncle 07/05/2014  . BP (high blood pressure) 07/05/2014  . Does not feel right 07/05/2014  . Pain of perianal area 07/05/2014  . Guttate psoriasis 07/05/2014  . Gonalgia 07/05/2014  . H/O diabetes mellitus 06/05/2014  . CAFL (chronic airflow limitation) ([place]) 06/05/2014  . H/O visual disturbance 06/05/2014  . H/O elevated lipids 06/05/2014  . H/O: HTN (hypertension) 06/05/2014  . H/O: osteoarthritis 06/05/2014  . Initial insomnia 06/05/2014  . Anxiety, generalized 06/05/2014  . Depression, major, recurrent, moderate ([place]) 06/05/2014  . Microalbuminuria 12/23/2013  . Long term current use of insulin ([place]) 12/23/2013  . Compulsive tobacco user syndrome 12/23/2013  . Type 2 diabetes mellitus with other diabetic kidney complication ([place]) [id number]  . Contusion of cheek 03/31/2009  . Change in blood platelet count 06/08/2007  . Blood in feces 06/01/2007  . D (diarrhea) 06/01/2007  . Disturbance of skin sensation 09/02/2006  . Difficulty hearing 07/25/2006  . Acute onset aura migraine 06/27/2006  . Cephalalgia 05/27/2006  . Clinical depression 10/22/2005  .  Diabetes mellitus type 2, uncontrolled ([place]) 10/22/2005  . HLD (hyperlipidemia) 10/22/2005  . Arthritis, degenerative 10/22/2005  . Current tobacco use 10/22/2005    Past Surgical History  Procedure Laterality Date  . Abdominal hysterectomy    . Tubal ligation  1978  . Cesarean section  1978  . Knee arthroscopy Left 2005  . Ulnar nerve transposition  2008  . Bilateral salpingoophorectomy  2000  . Ankle  fracture surgery Left 2002  . Tee without cardioversion N/A 01/31/2015    Procedure: TRANSESOPHAGEAL ECHOCARDIOGRAM (TEE);  Surgeon: Lelon Perla, MD;  Location: [facility] ENDOSCOPY;  Service: Cardiovascular;  Laterality: N/A;  . Cardiac catheterization N/A 03/25/2015    Procedure: Right/Left Heart Cath and Coronary Angiography;  Surgeon: Belva Crome, MD;  Location: [facility];  Service: Cardiovascular;  Laterality: N/A;    Current Outpatient Rx  Name  Route  Sig  Dispense  Refill  . Apremilast (OTEZLA) 30 MG TABS   Oral   Take 30 mg by mouth 2 (two) times daily.         Marland Kitchen aspirin 325 MG tablet   Oral   Take 1 tablet (325 mg total) by mouth daily.   30 tablet   0   . atorvastatin (LIPITOR) 10 MG tablet   Oral   Take 1 tablet (10 mg total) by mouth daily.   90 tablet   3   . carvedilol (COREG) 6.25 MG tablet   Oral   Take 12.5 mg by mouth 2 (two) times daily.          . clonazePAM (KLONOPIN) 0.5 MG tablet   Oral   Take 1 tablet (0.5 mg total) by mouth at bedtime. Patient taking differently: Take 0.5 mg by mouth at bedtime as needed for anxiety.    30 tablet   2   . diclofenac (VOLTAREN) 75 MG EC tablet      TAKE 1 TABLET BY MOUTH TWICE A DAY AS NEEDED   60 tablet   3   . glyBURIDE-metformin (GLUCOVANCE) 5-500 MG per tablet   Oral   Take 2 tablets by mouth 2 (two) times daily.          . Insulin Glargine (TOUJEO SOLOSTAR) 300 UNIT/ML SOPN   Subcutaneous   Inject 40 Units into the skin every evening. Patient taking differently: Inject 60 Units into the skin every evening.          . loratadine (CLARITIN) 10 MG tablet   Oral   Take 1 tablet by mouth daily.         Marland Kitchen losartan (COZAAR) 50 MG tablet      TAKE 1 TABLET BY MOUTH EVERY DAY   30 tablet   6   . ONE TOUCH ULTRA TEST test strip   Other   1 each by Other route daily.            Dispense as written.   Glory Rosebush DELICA LANCETS FINE MISC   Other   1 each by Other route daily.           Marland Kitchen PARoxetine (PAXIL) 40 MG tablet   Oral   Take 1 tablet (40 mg total) by mouth daily.   90 tablet   3   . spironolactone (ALDACTONE) 25 MG tablet   Oral   Take 0.5 tablets (12.5 mg total) by mouth daily.   45 tablet   3     Allergies Codeine  Family History  Problem  Relation Age of Onset  . Heart disease Mother     died from CHF  . Asthma Mother   . Diabetes Mother   . Heart disease Father   . Aneurysm Father   . Diabetes Brother   . Alcohol abuse Paternal Aunt     Social History Social History  Substance Use Topics  . Smoking status: Current Every Day Smoker -- 1.00 packs/day for 35 years    Types: Cigarettes    Start date: 02/08/1974  . Smokeless tobacco: Never Used  . Alcohol Use: No    Review of Systems Constitutional: No fever/chills Eyes: No visual changes. ENT: No sore throat. Cardiovascular: chest pain. Respiratory: shortness of breath. Gastrointestinal: No abdominal pain.  No nausea, no vomiting.  No diarrhea.  No constipation. Genitourinary: Negative for dysuria. Musculoskeletal: Negative for back pain. Skin: Negative for rash. Neurological: dizziness  10-point ROS otherwise negative.  ____________________________________________   PHYSICAL EXAM:  VITAL SIGNS: ED Triage Vitals  Enc Vitals Group     BP 06/02/15 0500 150/82 mmHg     Pulse Rate 06/02/15 0443 74     Resp 06/02/15 0443 19     Temp 06/02/15 0443 98 F (36.7 C)     Temp Source 06/02/15 0443 Oral     SpO2 06/02/15 0443 98 %     Weight 06/02/15 0443 206 lb (93.441 kg)     Height 06/02/15 0443 5\' 5"  (1.651 m)     Head Cir --      Peak Flow --      Pain Score 06/02/15 0445 5     Pain Loc --      Pain Edu? --      Excl. in [place]? --     Constitutional: Alert and oriented. Well appearing and in mild distress. Eyes: Conjunctivae are normal. PERRL. EOMI. Head: Atraumatic. Nose: No congestion/rhinnorhea. Mouth/Throat: Mucous membranes are moist.  Oropharynx  non-erythematous. Cardiovascular: Normal rate, regular rhythm. Grossly normal heart sounds.  Good peripheral circulation. Respiratory: Normal respiratory effort.  No retractions. Lungs CTAB. Gastrointestinal: Soft and nontender. No distention. Positive bowel sounds Musculoskeletal: No lower extremity tenderness nor edema.  Neurologic:  Normal speech and language.  Skin:  Skin is warm, dry and intact.  Psychiatric: Mood and affect are normal.   ____________________________________________   LABS (all labs ordered are listed, but only abnormal results are displayed)  Labs Reviewed  BASIC METABOLIC PANEL - Abnormal; Notable for the following:    Glucose, Bld 273 (*)    BUN 38 (*)    Creatinine, Ser 1.68 (*)    GFR calc non Af Amer 32 (*)    GFR calc Af Amer 37 (*)    All other components within normal limits  CBC - Abnormal; Notable for the following:    WBC 12.1 (*)    RDW 14.9 (*)    All other components within normal limits  BRAIN NATRIURETIC PEPTIDE - Abnormal; Notable for the following:    B Natriuretic Peptide 177.0 (*)    All other components within normal limits  TROPONIN I   ____________________________________________  EKG  ED ECG REPORT I, Loney Hering, the attending physician, personally viewed and interpreted this ECG.   Date: 06/02/2015  EKG Time: 442  Rate: 74  Rhythm: normal sinus rhythm  Axis: normal  Intervals:none  ST&T Change: none  ____________________________________________  RADIOLOGY  Chest x-ray: No evidence of active disease. ____________________________________________   PROCEDURES  Procedure(s) performed: None  Critical Care  performed: No  ____________________________________________   INITIAL IMPRESSION / ASSESSMENT AND PLAN / ED COURSE  Pertinent labs & imaging results that were available during my care of the patient were reviewed by me and considered in my medical decision making (see chart for details).  This is  a 60 year old female who comes into the hospital today with chest pain. She also has some shortness of breath and lightheaded and dizziness. She reports that she hadn't felt well today. The patient's initial blood work is unremarkable. Her chest x-ray is also negative. I did give the patient some aspirin as well as a GI cocktail. The patient will be reassessed and I will also check orthostatics on her.  The patient's care will be signed out to Dr. Burlene Arnt will follow-up the results of the troponin and reassess the patient prior to her disposition. ____________________________________________   FINAL CLINICAL IMPRESSION(S) / ED DIAGNOSES  Final diagnoses:  Chest pain, unspecified chest pain type      Loney Hering, MD 06/02/15 [phone]

## 2015-06-02 NOTE — ED Provider Notes (Signed)
-----------------------------------------   7:44 AM on 06/02/2015 -----------------------------------------  Patient with multiple medical problems who woke up this morning "not feeling quite right". She denies any focal numbness or weakness. She denies any chest pain at this time. She states she had a little bit of discomfort in her chest apparently earlier. It went completely away with a GI cocktail. Patient had a heart catheterization within the last 2 months that showed no coronary disease. She does have a slightly low EF. Her blood work is reassuring although she looks mildly dehydrated given her BUN/creatinine we'll give her gentle hydration and reassess. Patient has no symptoms at this time. She is not complaining of any focal neurologic deficit and her neurologic exam is normal at this time. Cranial nerves II through XII are grossly intact 5 out of 5 strength bilateral upper and lower extremity. Finger to nose within normal limits heel to shin within normal limits, speech is normal with no word finding difficulty or dysarthria, reflexes symmetric, pupils are equally round and reactive to light, there is no pronator drift, sensation is normal, vision is intact to confrontation, gait is deferred, there is no nystagmus, normal neurologic exam, she was breathing somewhat quickly but seemed to be somewhat anxious and at this time she is calming down now that she knows her results of this far reassuring. We will continue to observe her closely in the emergency department . At this time, I have low suspicion that she has a PE, she has no pulmonary complaints, and I think any investigation into that would much more likely harm her kidneys then find a blood clot.  Schuyler Amor, MD 06/02/15 [phone]

## 2015-06-12 ENCOUNTER — Encounter: Payer: Self-pay | Admitting: Neurology

## 2015-06-12 ENCOUNTER — Ambulatory Visit (INDEPENDENT_AMBULATORY_CARE_PROVIDER_SITE_OTHER): Payer: Medicare Other | Admitting: Neurology

## 2015-06-12 VITALS — BP 122/82 | HR 76 | Resp 20 | Ht 66.0 in | Wt 208.0 lb

## 2015-06-12 DIAGNOSIS — F329 Major depressive disorder, single episode, unspecified: Secondary | ICD-10-CM

## 2015-06-12 DIAGNOSIS — I639 Cerebral infarction, unspecified: Secondary | ICD-10-CM

## 2015-06-12 DIAGNOSIS — G4719 Other hypersomnia: Secondary | ICD-10-CM | POA: Diagnosis not present

## 2015-06-12 DIAGNOSIS — G4753 Recurrent isolated sleep paralysis: Secondary | ICD-10-CM | POA: Diagnosis not present

## 2015-06-12 DIAGNOSIS — G4710 Hypersomnia, unspecified: Secondary | ICD-10-CM | POA: Diagnosis not present

## 2015-06-12 DIAGNOSIS — I63033 Cerebral infarction due to thrombosis of bilateral carotid arteries: Secondary | ICD-10-CM | POA: Diagnosis not present

## 2015-06-12 DIAGNOSIS — G47411 Narcolepsy with cataplexy: Secondary | ICD-10-CM | POA: Diagnosis not present

## 2015-06-12 DIAGNOSIS — F32A Depression, unspecified: Secondary | ICD-10-CM | POA: Insufficient documentation

## 2015-06-12 DIAGNOSIS — G4730 Sleep apnea, unspecified: Secondary | ICD-10-CM | POA: Diagnosis not present

## 2015-06-12 NOTE — Progress Notes (Signed)
SLEEP MEDICINE CLINIC   Provider:  Larey Seat, M D  Referring Provider:  Dr Leonie Man, MD  Primary Care Physician:  Vernie Murders, PA  Chief Complaint  Patient presents with  . Follow-up    sleep study results, possible narcolepsy testing, rm 10, alone    HPI:  Jocelyn Wells is a 60 y.o. female , seen here as a referral from Dr. Antony Contras , as a  stroke clinic patient. Chief complaint according to patient : " I snore and gained weight, and I smoke and live with a smoker"  "I am sleepy ".   This 60 year old Caucasian right-handed female is seen here after she suffered a stroke. She was seen on 01/29/2015 when she arrived at the emergency room for a stroke code was activated. She has a history of diabetes mellitus, obesity, hypertension and presented with sudden onset of right hand weakness clumsiness and numbness and some word finding difficulties. The NIH score was only 3 points and her symptoms were considered to mild to risk the side effects of potential TPA therapy and MRI of the brain documented a small left-sided infarction in the middle cerebral artery branch at the M3 branch.  This corresponded well with the patient's symptoms, carotid ultrasound was unremarkable, transthoracic echo showed a diminished ejection fraction of 30% without definite clotting. She then underwent a transesophageal echocardiogram which revealed again a very restricted ejection fraction and a severe global reduction in the left ventricular pump function. A cardiac catheter on 03-25-15: Frontal ejection fraction but ruled out the presence of significant coronary artery disease. She was seen by Dr. Virl Axe and [facility] who will see her again in about 2 months. When she saw Dr. Leonie Man she had mentioned that she snores a lot and he arranged in coordination with her cardiologist that her sleep study should be performed. She has signed up for smoking cessation and is interested in a weight loss program. She  already lost 40 pounds over the last 7 month!.     Sleep habits are as follows: The patient states that a lot of nights she falls asleep already watching TV on the sofa. In the late afternoon and early evening hours she is significantly more tired than prior to lunchtime. A lot of nights she feels that she cannot get good rest.. This has changed her sleep-wake rhythm. Most evening as she is watching TV or works on her computer. She is often up until midnight sometimes longer. If she hasn't falling asleep watching TV she will usually go to the bedroom at about midnight or later. Once and that she still may have trouble falling asleep and some nights she believes the bedroom again. If she falls asleep in bed she usually feels a asleep. She has bathroom breaks at night about 3 times. The first nocturia it at 4 and later 5 and 6 AM. The bedroom is cool, quiet and dark. She sleeps elevated on 2 pillows, she does not feel that she sleeps better in a reclined position but actually in bed. She prefers to sleep on her side. She shares no longer a bedroom with her husband, and goes to the guestroom.  She portals excessive daytime sleepiness alternating with nocturnal insomnia problems. She reports also vivid dreams. She has had isolated sleep paralysis, she had isolated cataplectic attacks when emotionally upset angry.  She often takes her dog into the bedroom. She tries to get 6 hours of sleep. She feels better after 8 hours. She  is daytime fatigued, short of breath, some days she feels an irresistible urge to nap. This arose with the stroke and her cardiomyopathy. She is less exercise tolerant.  She noted to be a sleepy child and student and having fallen asleep in the school classroom!    Interval history for Dr. Clydene Fake patient, Jocelyn Wells. Dragon, after he ordered a sleep study. She underwent a sleep study, a diagnostic polysomnography on 05/08/2015. Her comorbidities at the time were a recent stroke,  excessive daytime sleepiness, cardiomyopathy, hypertension, COPD and diabetes mellitus. We also discussed her sleep hygiene routines and rituals. She endorsed the Epworth Sleepiness Scale at 19 out of 24 possible points.  The patient's AHI was 0.0 which greatly surprised me there were indeed no apneas, she had some periodic limb movements but not enough to fragment her sleep. She did have a prolonged period of hypoxia with a nadir at 78 and a total desaturation of over 35 minutes at night- could be  considered able to receive oxygen supplement ? Marland Kitchen This was ordered and not applied during her sleep study.. There was no cardiac distress noted in response. There was also no early REM onset. I recommended HLA testing for narcolepsy because her hypersomnia remained unexplained. There was no other evidence of a physiologic or organic sleep disorder.   Sleep medical history and family sleep history:  She has full dentures , no natural teeth preserved.  Sinusitis , bronchitis, COPD - cardiomyotpathy , still had tonsils.  Social history:  Married smoker- 1 ppd for over 40 years. : iced tea 3- 4  a day, no coffee, no sodas. No ETOH, but all day caffeine   Review of Systems: Out of a complete 14 system review, the patient complains of only the following symptoms, and all other reviewed systems are negative.  Irresistible urge to go to sleep in daytime, alternating with poor sleep at night, vivid dreams, sleep paralysis, cataplectic attacks, Epworth sleepiness score of 19 fatigue severity score 59 and geriatric depression score of 11 out of 15 possible points. Further endorsed depression, anxiety, decreased energy fatigability decreased exercise tolerance with shortness of breath, sleepiness, snoring memory loss confusion headaches blurred vision easy bruising, aching muscles, allergies. Surgical history for hysterectomy in the year 2000 for fractures of the ankle in 2002 and 2003, and all non-nerve transposition in  2008. Positive further for diabetes, hypercholesterolemia, heart disease, cardiomyopathy, reduced ejection fraction, migraine, depression, anxiety  Epworth score 19 .   Social History   Social History  . Marital Status: Married    Spouse Name: N/A  . Number of Children: N/A  . Years of Education: N/A   Occupational History  . Not on file.   Social History Main Topics  . Smoking status: Current Every Day Smoker -- 1.00 packs/day for 35 years    Types: Cigarettes    Start date: 02/08/1974  . Smokeless tobacco: Never Used  . Alcohol Use: No  . Drug Use: No  . Sexual Activity: Yes    Birth Control/ Protection: None   Other Topics Concern  . Not on file   Social History Narrative    Family History  Problem Relation Age of Onset  . Heart disease Mother     died from CHF  . Asthma Mother   . Diabetes Mother   . Heart disease Father   . Aneurysm Father   . Diabetes Brother   . Alcohol abuse Paternal Aunt     Past Medical History  Diagnosis Date  . Anxiety   . Depression   . Diabetes mellitus, type II ([place])   . PONV (postoperative nausea and vomiting)   . Cardiomyopathy [facility])     new to her Jan 2017  . HTN (hypertension)   . Obesity   . COPD (chronic obstructive pulmonary disease) ([place])   . Stroke [facility])     Jan 2017    Past Surgical History  Procedure Laterality Date  . Abdominal hysterectomy    . Tubal ligation  1978  . Cesarean section  1978  . Knee arthroscopy Left 2005  . Ulnar nerve transposition  2008  . Bilateral salpingoophorectomy  2000  . Ankle fracture surgery Left 2002  . Tee without cardioversion N/A 01/31/2015    Procedure: TRANSESOPHAGEAL ECHOCARDIOGRAM (TEE);  Surgeon: Lelon Perla, MD;  Location: [facility] ENDOSCOPY;  Service: Cardiovascular;  Laterality: N/A;  . Cardiac catheterization N/A 03/25/2015    Procedure: Right/Left Heart Cath and Coronary Angiography;  Surgeon: Belva Crome, MD;  Location: [place] CV LAB;  Service:  Cardiovascular;  Laterality: N/A;    Current Outpatient Prescriptions  Medication Sig Dispense Refill  . Apremilast (OTEZLA) 30 MG TABS Take 30 mg by mouth 2 (two) times daily.    Marland Kitchen aspirin 325 MG tablet Take 1 tablet (325 mg total) by mouth daily. 30 tablet 0  . atorvastatin (LIPITOR) 10 MG tablet Take 1 tablet (10 mg total) by mouth daily. 90 tablet 3  . carvedilol (COREG) 6.25 MG tablet Take 12.5 mg by mouth 2 (two) times daily.     . clonazePAM (KLONOPIN) 0.5 MG tablet Take 1 tablet (0.5 mg total) by mouth at bedtime. (Patient taking differently: Take 0.5 mg by mouth at bedtime as needed for anxiety. ) 30 tablet 2  . diclofenac (VOLTAREN) 75 MG EC tablet TAKE 1 TABLET BY MOUTH TWICE A DAY AS NEEDED 60 tablet 3  . glyBURIDE-metformin (GLUCOVANCE) 5-500 MG per tablet Take 2 tablets by mouth 2 (two) times daily.     . Insulin Glargine (TOUJEO SOLOSTAR) 300 UNIT/ML SOPN Inject 40 Units into the skin every evening. (Patient taking differently: Inject 60 Units into the skin every evening. )    . loratadine (CLARITIN) 10 MG tablet Take 1 tablet by mouth daily.    Marland Kitchen losartan (COZAAR) 50 MG tablet TAKE 1 TABLET BY MOUTH EVERY DAY 30 tablet 6  . ONE TOUCH ULTRA TEST test strip 1 each by Other route daily.     Glory Rosebush DELICA LANCETS FINE MISC 1 each by Other route daily.     Marland Kitchen PARoxetine (PAXIL) 40 MG tablet Take 1 tablet (40 mg total) by mouth daily. 90 tablet 3   No current facility-administered medications for this visit.    Allergies as of 06/12/2015 - Review Complete 06/12/2015  Allergen Reaction Noted  . Codeine Nausea And Vomiting 07/05/2014    Vitals: BP 122/82 mmHg  Pulse 76  Resp 20  Ht 5' 6"  (1.676 m)  Wt 208 lb (94.348 kg)  BMI 33.59 kg/m2 Last Weight:  Wt Readings from Last 1 Encounters:  06/12/15 208 lb (94.348 kg)   CHY:IFOY mass index is 33.59 kg/(m^2).     Last Height:   Ht Readings from Last 1 Encounters:  06/12/15 5' 6"  (1.676 m)    Physical exam:  General:  The patient is awake, alert and appears not in acute distress. The patient is well groomed. Head: Normocephalic, atraumatic. Neck is supple. Mallampati 4,  neck circumference:  16 . edentulous . Retrognathia is seen.  Cardiovascular:  Regular rate and rhythm, without  murmurs or carotid bruit, and without distended neck veins. Respiratory: Lungs are clear to auscultation. Skin:  Without evidence of edema, or rash Trunk: BMI is elevated Neurologic exam : The patient is awake and alert, oriented to place and time.   Memory subjective  described as intact   Attention span & concentration ability appears normal.  Speech is fluent,  without  dysarthria, dysphonia and I noted no  aphasia.  Mood and affect are depressed.  Cranial nerves:  Her sense of smell was reported as blunted , Pupils are equal and briskly reactive to light.  Funduscopic exam without evidence of pallor or edema. Extraocular movements  in vertical and horizontal planes intact and without nystagmus. Visual fields by finger perimetry are intact. Hearing to finger rub intact. Facial sensation intact to fine touch.Facial motor strength is symmetric and tongue and uvula move midline. Shoulder shrug was symmetrical.   Deep tendon reflexes: in the  upper and lower extremities are symmetric and intact. The patient was advised of the nature of the diagnosed sleep disorder , the treatment options and risks for general a health and wellness arising from not treating the condition.  I spent more than  55 minutes of face to face time with the patient. Greater than 50% of time was spent in counseling and coordination of care. We have discussed the diagnosis and differential and I answered the patient's questions.     Assessment:  After physical and neurologic examination, review of laboratory studies,  Personal review of imaging studies, reports of other /same  Imaging studies ,  Results of polysomnography/ neurophysiology testing and  pre-existing records as far as provided in visit., my assessment is   1) Jocelyn Wells presents as a smoker, with loss of olfactory sense , morbid obesity, cardiomyopathy and a low ejection fraction, hypertension and diabetes after a very recent stroke. She has high risk factors for obstructive sleep apnea including body mass index, neck circumference and Mallampati.  She suffers from excessive daytime sleepiness , but her polysomnography did not reveal apnea, significant retention of carbon dioxide, a moderate period of hypoxia no significant PLMS. Given that she endorsed 19 points on the Epworth score the differential includes narcolepsy which can only be tested by HLA,  And depression related fatigue and sleepiness, psychopathology.   I will send today for a blood test, should this returned negative, she will need to to retrun to Dr Leonie Man and continue with visits at her psychiatrist (Dr. Gretel Acre at [facility] ).    Asencion Partridge Ranie Chinchilla MD  06/12/2015   CC:  Antony Contras , MD    [address] [place], [place] [address]

## 2015-06-12 NOTE — Patient Instructions (Signed)
Smoking Cessation, Tips for Success If you are ready to quit smoking, congratulations! You have chosen to help yourself be healthier. Cigarettes bring nicotine, tar, carbon monoxide, and other irritants into your body. Your lungs, heart, and blood vessels will be able to work better without these poisons. There are many different ways to quit smoking. Nicotine gum, nicotine patches, a nicotine inhaler, or nicotine nasal spray can help with physical craving. Hypnosis, support groups, and medicines help break the habit of smoking. WHAT THINGS CAN I DO TO MAKE QUITTING EASIER?  Here are some tips to help you quit for good:  Pick a date when you will quit smoking completely. Tell all of your friends and family about your plan to quit on that date.  Do not try to slowly cut down on the number of cigarettes you are smoking. Pick a quit date and quit smoking completely starting on that day.  Throw away all cigarettes.   Clean and remove all ashtrays from your home, work, and car.  On a card, write down your reasons for quitting. Carry the card with you and read it when you get the urge to smoke.  Cleanse your body of nicotine. Drink enough water and fluids to keep your urine clear or pale yellow. Do this after quitting to flush the nicotine from your body.  Learn to predict your moods. Do not let a bad situation be your excuse to have a cigarette. Some situations in your life might tempt you into wanting a cigarette.  Never have "just one" cigarette. It leads to wanting another and another. Remind yourself of your decision to quit.  Change habits associated with smoking. If you smoked while driving or when feeling stressed, try other activities to replace smoking. Stand up when drinking your coffee. Brush your teeth after eating. Sit in a different chair when you read the paper. Avoid alcohol while trying to quit, and try to drink fewer caffeinated beverages. Alcohol and caffeine may urge you to  smoke.  Avoid foods and drinks that can trigger a desire to smoke, such as sugary or spicy foods and alcohol.  Ask people who smoke not to smoke around you.  Have something planned to do right after eating or having a cup of coffee. For example, plan to take a walk or exercise.  Try a relaxation exercise to calm you down and decrease your stress. Remember, you may be tense and nervous for the first 2 weeks after you quit, but this will pass.  Find new activities to keep your hands busy. Play with a pen, coin, or rubber band. Doodle or draw things on paper.  Brush your teeth right after eating. This will help cut down on the craving for the taste of tobacco after meals. You can also try mouthwash.   Use oral substitutes in place of cigarettes. Try using lemon drops, carrots, cinnamon sticks, or chewing gum. Keep them handy so they are available when you have the urge to smoke.  When you have the urge to smoke, try deep breathing.  Designate your home as a nonsmoking area.  If you are a heavy smoker, ask your health care provider about a prescription for nicotine chewing gum. It can ease your withdrawal from nicotine.  Reward yourself. Set aside the cigarette money you save and buy yourself something nice.  Look for support from others. Join a support group or smoking cessation program. Ask someone at home or at work to help you with your plan   to quit smoking.  Always ask yourself, "Do I need this cigarette or is this just a reflex?" Tell yourself, "Today, I choose not to smoke," or "I do not want to smoke." You are reminding yourself of your decision to quit.  Do not replace cigarette smoking with electronic cigarettes (commonly called e-cigarettes). The safety of e-cigarettes is unknown, and some may contain harmful chemicals.  If you relapse, do not give up! Plan ahead and think about what you will do the next time you get the urge to smoke. HOW WILL I FEEL WHEN I QUIT SMOKING? You  may have symptoms of withdrawal because your body is used to nicotine (the addictive substance in cigarettes). You may crave cigarettes, be irritable, feel very hungry, cough often, get headaches, or have difficulty concentrating. The withdrawal symptoms are only temporary. They are strongest when you first quit but will go away within 10-14 days. When withdrawal symptoms occur, stay in control. Think about your reasons for quitting. Remind yourself that these are signs that your body is healing and getting used to being without cigarettes. Remember that withdrawal symptoms are easier to treat than the major diseases that smoking can cause.  Even after the withdrawal is over, expect periodic urges to smoke. However, these cravings are generally short lived and will go away whether you smoke or not. Do not smoke! WHAT RESOURCES ARE AVAILABLE TO HELP ME QUIT SMOKING? Your health care provider can direct you to community resources or hospitals for support, which may include:  Group support.  Education.  Hypnosis.  Therapy.   This information is not intended to replace advice given to you by your health care provider. Make sure you discuss any questions you have with your health care provider.   Document Released: 10/24/2003 Document Revised: 02/15/2014 Document Reviewed: 07/13/2012 Elsevier Interactive Patient Education 2016 [employer]  

## 2015-06-13 ENCOUNTER — Ambulatory Visit (INDEPENDENT_AMBULATORY_CARE_PROVIDER_SITE_OTHER): Payer: Medicare Other

## 2015-06-13 ENCOUNTER — Other Ambulatory Visit: Payer: Self-pay

## 2015-06-13 DIAGNOSIS — I509 Heart failure, unspecified: Secondary | ICD-10-CM

## 2015-06-19 ENCOUNTER — Ambulatory Visit: Payer: Medicare Other | Admitting: Psychiatry

## 2015-06-22 ENCOUNTER — Other Ambulatory Visit: Payer: Self-pay | Admitting: Family Medicine

## 2015-06-23 ENCOUNTER — Telehealth: Payer: Self-pay

## 2015-06-23 NOTE — Telephone Encounter (Signed)
Spoke with pt and advised her that her HLA test was negative (normal) and therefore Dr. Brett Fairy recommends following up with Dr. Leonie Man and her psychiatrist. Pt verbalized understanding. Pt had no questions at this time but was encouraged to call back if questions arise.

## 2015-07-01 ENCOUNTER — Ambulatory Visit: Payer: Medicare Other | Admitting: Psychiatry

## 2015-07-08 DIAGNOSIS — L409 Psoriasis, unspecified: Secondary | ICD-10-CM | POA: Diagnosis not present

## 2015-07-08 DIAGNOSIS — M1990 Unspecified osteoarthritis, unspecified site: Secondary | ICD-10-CM | POA: Diagnosis not present

## 2015-07-08 DIAGNOSIS — G8929 Other chronic pain: Secondary | ICD-10-CM | POA: Diagnosis not present

## 2015-07-08 DIAGNOSIS — M25561 Pain in right knee: Secondary | ICD-10-CM | POA: Diagnosis not present

## 2015-07-11 ENCOUNTER — Ambulatory Visit (INDEPENDENT_AMBULATORY_CARE_PROVIDER_SITE_OTHER): Payer: Medicare Other | Admitting: Psychiatry

## 2015-07-11 ENCOUNTER — Encounter: Payer: Self-pay | Admitting: Psychiatry

## 2015-07-11 VITALS — BP 130/84 | HR 82 | Temp 97.4°F | Ht 66.0 in | Wt 214.0 lb

## 2015-07-11 DIAGNOSIS — F331 Major depressive disorder, recurrent, moderate: Secondary | ICD-10-CM | POA: Diagnosis not present

## 2015-07-11 DIAGNOSIS — I639 Cerebral infarction, unspecified: Secondary | ICD-10-CM | POA: Diagnosis not present

## 2015-07-11 MED ORDER — ARIPIPRAZOLE 2 MG PO TABS: 2.0000 mg | ORAL_TABLET | Freq: Every day | ORAL | Status: DC

## 2015-07-11 MED ORDER — PAROXETINE HCL 20 MG PO TABS: 20.0000 mg | ORAL_TABLET | Freq: Every day | ORAL | Status: DC

## 2015-07-11 NOTE — Progress Notes (Signed)
BH MD/PA/NP OP Progress Note  07/11/2015 11:17 AM Jocelyn Wells  MRN:  [id number]  Subjective:   Pt is  a 60 year old female who presented for follow-up appointment. She reported that she continues to feel depressed and has low energy. She reported that she went for sleep study and they did not diagnose her with sleep apnea or narcolepsy. Patient reported that she has been compliant with her medications. She does not take any Klonopin and has been using it only on a when necessary basis. She has been taking Paxil on a regular basis. Patient reported that she is interested in taking medications to help with her mood symptoms. She stated that her energy level is low. They are planning a wedding for her son. Patient appeared calm during the interview. She continues to have problems with her speech and has word finding difficulty. She reported that they are still looking for cardiac causes of her tiredness. She appeared receptive to medication changes at this time. She denied having any suicidal ideations or plans.     Chief Complaint:  Chief Complaint    Follow-up; Medication Refill     Visit Diagnosis:     ICD-9-CM ICD-10-CM   1. MDD (major depressive disorder), recurrent episode, moderate ([place]) 296.32 F33.1     Past Medical History:  Past Medical History  Diagnosis Date  . Anxiety   . Depression   . Diabetes mellitus, type II ([place])   . PONV (postoperative nausea and vomiting)   . Cardiomyopathy [facility])     new to her Jan 2017  . HTN (hypertension)   . Obesity   . COPD (chronic obstructive pulmonary disease) ([place])   . Stroke [facility])     Jan 2017    Past Surgical History  Procedure Laterality Date  . Abdominal hysterectomy    . Tubal ligation  1978  . Cesarean section  1978  . Knee arthroscopy Left 2005  . Ulnar nerve transposition  2008  . Bilateral salpingoophorectomy  2000  . Ankle fracture surgery Left 2002  . Tee without cardioversion N/A 01/31/2015    Procedure:  TRANSESOPHAGEAL ECHOCARDIOGRAM (TEE);  Surgeon: Lelon Perla, MD;  Location: [facility] ENDOSCOPY;  Service: Cardiovascular;  Laterality: N/A;  . Cardiac catheterization N/A 03/25/2015    Procedure: Right/Left Heart Cath and Coronary Angiography;  Surgeon: Belva Crome, MD;  Location: [place] CV LAB;  Service: Cardiovascular;  Laterality: N/A;   Family History:  Family History  Problem Relation Age of Onset  . Heart disease Mother     died from CHF  . Asthma Mother   . Diabetes Mother   . Heart disease Father   . Aneurysm Father   . Diabetes Brother   . Alcohol abuse Paternal Aunt    Social History:  Social History   Social History  . Marital Status: Married    Spouse Name: N/A  . Number of Children: N/A  . Years of Education: N/A   Social History Main Topics  . Smoking status: Current Every Day Smoker -- 1.00 packs/day for 35 years    Types: Cigarettes    Start date: 02/08/1974  . Smokeless tobacco: Never Used  . Alcohol Use: No  . Drug Use: No  . Sexual Activity: Yes    Birth Control/ Protection: None   Other Topics Concern  . None   Social History Narrative   Additional History:  Married x 18 years. Has 2 children and Her husband has 2 children as well  Assessment:   Musculoskeletal: Strength & Muscle Tone: within normal limits Gait & Station: normal Patient leans: N/A  Psychiatric Specialty Exam: HPI  Review of Systems  Constitutional: Negative for chills and weight loss.  HENT: Negative for nosebleeds and tinnitus.   Eyes: Negative for double vision and discharge.  Respiratory: Positive for cough.   Cardiovascular: Negative for orthopnea.  Gastrointestinal: Positive for nausea and vomiting. Negative for diarrhea and blood in stool.  Genitourinary: Negative for urgency.  Skin: Negative for rash.  Neurological: Positive for sensory change, speech change and focal weakness. Negative for tingling and headaches.  Endo/Heme/Allergies: Negative for  environmental allergies.  Psychiatric/Behavioral: Positive for depression.  All other systems reviewed and are negative.   Blood pressure 130/84, pulse 82, temperature 97.4 F (36.3 C), temperature source Tympanic, height 5\' 6"  (1.676 m), weight 214 lb (97.07 kg), SpO2 94 %.Body mass index is 34.56 kg/(m^2).  General Appearance: Casual  Eye Contact:  Fair  Speech:  Slow  Volume:  Normal  Mood:  Depressed  Affect:  Congruent  Thought Process:  Coherent  Orientation:  Full (Time, Place, and Person)  Thought Content:  WDL  Suicidal Thoughts:  No  Homicidal Thoughts:  No  Memory:  Immediate;   Fair  Judgement:  Fair  Insight:  Fair  Psychomotor Activity:  Normal  Concentration:  Fair  Recall:  [employer] of Knowledge: Fair  Language: Fair  Akathisia:  No  Handed:  Right  AIMS (if indicated):  none  Assets:  Communication Skills Desire for Improvement Physical Health Social Support  ADL's:  Intact  Cognition: WNL  Sleep:  6-7   Is the patient at risk to self?  No. Has the patient been a risk to self in the past 6 months?  No. Has the patient been a risk to self within the distant past?  No. Is the patient a risk to others?  No. Has the patient been a risk to others in the past 6 months?  No. Has the patient been a risk to others within the distant past?  No.  Current Medications: Current Outpatient Prescriptions  Medication Sig Dispense Refill  . Apremilast (OTEZLA) 30 MG TABS Take 30 mg by mouth 2 (two) times daily.    Marland Kitchen aspirin 325 MG tablet Take 1 tablet (325 mg total) by mouth daily. 30 tablet 0  . atorvastatin (LIPITOR) 10 MG tablet Take 1 tablet (10 mg total) by mouth daily. 90 tablet 3  . carvedilol (COREG) 6.25 MG tablet Take 12.5 mg by mouth 2 (two) times daily.     . clonazePAM (KLONOPIN) 0.5 MG tablet Take 1 tablet (0.5 mg total) by mouth at bedtime. (Patient taking differently: Take 0.5 mg by mouth at bedtime as needed for anxiety. ) 30 tablet 2  . diclofenac  (VOLTAREN) 75 MG EC tablet TAKE 1 TABLET BY MOUTH TWICE A DAY AS NEEDED 60 tablet 3  . glyBURIDE-metformin (GLUCOVANCE) 5-500 MG per tablet Take 2 tablets by mouth 2 (two) times daily.     . Insulin Glargine (TOUJEO SOLOSTAR) 300 UNIT/ML SOPN Inject 40 Units into the skin every evening. (Patient taking differently: Inject 60 Units into the skin every evening. )    . loratadine (CLARITIN) 10 MG tablet Take 1 tablet by mouth daily.    Marland Kitchen losartan (COZAAR) 50 MG tablet TAKE 1 TABLET BY MOUTH EVERY DAY 30 tablet 6  . ONE TOUCH ULTRA TEST test strip 1 each by Other route daily.     Marland Kitchen  ONETOUCH DELICA LANCETS FINE MISC 1 each by Other route daily.     Marland Kitchen PARoxetine (PAXIL) 40 MG tablet Take 1 tablet (40 mg total) by mouth daily. 90 tablet 3  . spironolactone (ALDACTONE) 25 MG tablet      No current facility-administered medications for this visit.    Medical Decision Making:  Established Problem, Stable/Improving (1), Review of Psycho-Social Stressors (1) and Review of Last Therapy Session (1)  Treatment Plan Summary:Medication management   Depression and anxiety I will start her on Paxil 20 mg by mouth daily at bedtime to help with the depression. I will start her on Abilify 2 mg by mouth daily to help with the added onto depressive symptoms. She will continue on Klonopin when necessary basis  Follow-up Follow-up in 1 months or earlier depending on her symptoms   More than 50% of the time spent in psychoeducation, counseling and coordination of care.     This note was generated in part or whole with voice recognition software. Voice regonition is usually quite accurate but there are transcription errors that can and very often do occur. I apologize for any typographical errors that were not detected and corrected.   Rainey Pines, MD

## 2015-07-14 ENCOUNTER — Telehealth: Payer: Self-pay

## 2015-07-14 DIAGNOSIS — N183 Chronic kidney disease, stage 3 (moderate): Secondary | ICD-10-CM | POA: Diagnosis not present

## 2015-07-14 DIAGNOSIS — Z9114 Patient's other noncompliance with medication regimen: Secondary | ICD-10-CM | POA: Diagnosis not present

## 2015-07-14 DIAGNOSIS — E1122 Type 2 diabetes mellitus with diabetic chronic kidney disease: Secondary | ICD-10-CM | POA: Diagnosis not present

## 2015-07-14 DIAGNOSIS — R809 Proteinuria, unspecified: Secondary | ICD-10-CM | POA: Diagnosis not present

## 2015-07-14 DIAGNOSIS — E1165 Type 2 diabetes mellitus with hyperglycemia: Secondary | ICD-10-CM | POA: Diagnosis not present

## 2015-07-14 DIAGNOSIS — E113393 Type 2 diabetes mellitus with moderate nonproliferative diabetic retinopathy without macular edema, bilateral: Secondary | ICD-10-CM | POA: Diagnosis not present

## 2015-07-14 DIAGNOSIS — Z794 Long term (current) use of insulin: Secondary | ICD-10-CM | POA: Diagnosis not present

## 2015-07-14 DIAGNOSIS — E1129 Type 2 diabetes mellitus with other diabetic kidney complication: Secondary | ICD-10-CM | POA: Diagnosis not present

## 2015-07-14 DIAGNOSIS — F17200 Nicotine dependence, unspecified, uncomplicated: Secondary | ICD-10-CM | POA: Diagnosis not present

## 2015-07-14 NOTE — Telephone Encounter (Signed)
per lea, (front desk) pt left a message on voice mail that the medication is making her sick.

## 2015-07-14 NOTE — Telephone Encounter (Signed)
called patient back to see what was going on, but pt son answered the phone and stated that she was in the shower.  left message for patient to call us back

## 2015-07-14 NOTE — Telephone Encounter (Signed)
left message to please call office back about what medication and symptons

## 2015-07-15 NOTE — Telephone Encounter (Signed)
Pt was called and given the instruction per dr. Gretel Acre.  Pt agreed

## 2015-07-15 NOTE — Telephone Encounter (Signed)
pt called stated that the abilify was making her very dizzy and out of it. please advise

## 2015-07-15 NOTE — Telephone Encounter (Signed)
Ask her to take 1/2 pill daily and follow up in 1 week.

## 2015-07-24 ENCOUNTER — Ambulatory Visit: Payer: Medicare Other | Admitting: Internal Medicine

## 2015-07-24 ENCOUNTER — Ambulatory Visit (INDEPENDENT_AMBULATORY_CARE_PROVIDER_SITE_OTHER): Payer: Medicare Other | Admitting: Internal Medicine

## 2015-07-24 ENCOUNTER — Encounter: Payer: Self-pay | Admitting: Internal Medicine

## 2015-07-24 VITALS — BP 118/78 | HR 73 | Ht 66.0 in | Wt 213.5 lb

## 2015-07-24 DIAGNOSIS — I429 Cardiomyopathy, unspecified: Secondary | ICD-10-CM | POA: Diagnosis not present

## 2015-07-24 DIAGNOSIS — I639 Cerebral infarction, unspecified: Secondary | ICD-10-CM

## 2015-07-24 DIAGNOSIS — I10 Essential (primary) hypertension: Secondary | ICD-10-CM | POA: Diagnosis not present

## 2015-07-24 DIAGNOSIS — I428 Other cardiomyopathies: Secondary | ICD-10-CM

## 2015-07-24 NOTE — Progress Notes (Signed)
Patient Care Team: Margo Common, PA as PCP - General (Physician Assistant)   HPI  Jocelyn Wells is a 60 y.o. female Seen in follow-up for was a recently diagnosed nonischemic cardiomyopathy for which guideline medical therapy had been introduced to shortly before the visit and 3/17.  She also has a history of cryptogenic stroke 12/16 She underwent repeat echocardiogram 5/17 which demonstrated ejection fraction of 40-45%  Catheterization 2/17 that demonstrated no obstructive coronary disease  Records and Results Reviewed As above   3/17   creatinine 1.30 4/17 creatinine 1.6  Potassium 4.7 6/17  creatinine 1.3 Potassium 4.7  Past Medical History  Diagnosis Date  . Anxiety   . Depression   . Diabetes mellitus, type II ([place])   . PONV (postoperative nausea and vomiting)   . Cardiomyopathy [facility])     new to her Jan 2017  . HTN (hypertension)   . Obesity   . COPD (chronic obstructive pulmonary disease) ([place])   . Stroke [facility])     Jan 2017    Past Surgical History  Procedure Laterality Date  . Abdominal hysterectomy    . Tubal ligation  1978  . Cesarean section  1978  . Knee arthroscopy Left 2005  . Ulnar nerve transposition  2008  . Bilateral salpingoophorectomy  2000  . Ankle fracture surgery Left 2002  . Tee without cardioversion N/A 01/31/2015    Procedure: TRANSESOPHAGEAL ECHOCARDIOGRAM (TEE);  Surgeon: Lelon Perla, MD;  Location: [facility] ENDOSCOPY;  Service: Cardiovascular;  Laterality: N/A;  . Cardiac catheterization N/A 03/25/2015    Procedure: Right/Left Heart Cath and Coronary Angiography;  Surgeon: Belva Crome, MD;  Location: [place] CV LAB;  Service: Cardiovascular;  Laterality: N/A;    Current Outpatient Prescriptions  Medication Sig Dispense Refill  . Apremilast (OTEZLA) 30 MG TABS Take 30 mg by mouth 2 (two) times daily.    Marland Kitchen aspirin 325 MG tablet Take 1 tablet (325 mg total) by mouth daily. 30 tablet 0  . atorvastatin (LIPITOR) 10 MG  tablet Take 1 tablet (10 mg total) by mouth daily. 90 tablet 3  . carvedilol (COREG) 6.25 MG tablet Take 12.5 mg by mouth 2 (two) times daily.     . clonazePAM (KLONOPIN) 0.5 MG tablet Take 1 tablet (0.5 mg total) by mouth at bedtime. (Patient taking differently: Take 0.5 mg by mouth at bedtime as needed for anxiety. ) 30 tablet 2  . diclofenac (VOLTAREN) 75 MG EC tablet TAKE 1 TABLET BY MOUTH TWICE A DAY AS NEEDED 60 tablet 3  . glyBURIDE-metformin (GLUCOVANCE) 5-500 MG per tablet Take 2 tablets by mouth 2 (two) times daily.     . Insulin Glargine (TOUJEO SOLOSTAR) 300 UNIT/ML SOPN Inject 40 Units into the skin every evening. (Patient taking differently: Inject 60 Units into the skin every evening. )    . loratadine (CLARITIN) 10 MG tablet Take 1 tablet by mouth daily.    Marland Kitchen losartan (COZAAR) 50 MG tablet TAKE 1 TABLET BY MOUTH EVERY DAY 30 tablet 6  . ONE TOUCH ULTRA TEST test strip 1 each by Other route daily.     Glory Rosebush DELICA LANCETS FINE MISC 1 each by Other route daily.     Marland Kitchen PARoxetine (PAXIL) 20 MG tablet Take 1 tablet (20 mg total) by mouth daily. (Patient taking differently: Take 40 mg by mouth daily. ) 90 tablet 0  . spironolactone (ALDACTONE) 25 MG tablet Take 25 mg by mouth 2 (  two) times daily.      No current facility-administered medications for this visit.    Allergies  Allergen Reactions  . Codeine Nausea And Vomiting      Review of Systems negative except from HPI and PMH  Physical Exam BP 118/78 mmHg  Pulse 73  Ht 5\' 6"  (1.676 m)  Wt 213 lb 8 oz (96.843 kg)  BMI 34.48 kg/m2 Well developed and well nourished in no acute distress HENT normal E scleral and icterus clear Neck Supple JVP flat; carotids brisk and full Clear to ausculation  *Regular rate and rhythm, no murmurs gallops or rub Soft with active bowel sounds No clubbing cyanosis  Edema Alert and oriented, grossly normal motor and sensory function Skin Warm and Dry    Assessment and  Plan     Nonischemic cardiomyopathy  Sleep disordered breathing  Cryptogenic stroke  High Risk Medication Surveillance  Grade 3 renal insufficiency   She is on guideline directed medical therapy for her nonischemic cardiomyopathy. There is been interval improvement so that her most recent ejection fraction was 40-45 %. We will continue her medications potassium levels are stable as noted above  With her history of cryptogenic stroke, we have discussed the role of the implantable loop recorder for the identification of atrial fibrillation. She is agreeable.  For now she will continue on aspirin .

## 2015-07-28 DIAGNOSIS — G8929 Other chronic pain: Secondary | ICD-10-CM | POA: Diagnosis not present

## 2015-07-28 DIAGNOSIS — M25561 Pain in right knee: Secondary | ICD-10-CM | POA: Diagnosis not present

## 2015-07-28 DIAGNOSIS — M1711 Unilateral primary osteoarthritis, right knee: Secondary | ICD-10-CM | POA: Diagnosis not present

## 2015-07-29 ENCOUNTER — Telehealth: Payer: Self-pay | Admitting: Internal Medicine

## 2015-07-29 NOTE — Telephone Encounter (Signed)
Attempted to call [facility] scheduling to set up [facility] implant. I left a message to please call today if possible, or I will call back tomorrow.

## 2015-07-30 ENCOUNTER — Encounter: Payer: Self-pay | Admitting: *Deleted

## 2015-07-30 NOTE — Telephone Encounter (Signed)
I left a message for [facility] scheduling to call me back to schedule LINQ implant for 6/27.

## 2015-07-30 NOTE — Telephone Encounter (Signed)
I spoke with Jocelyn Wells in scheduling at [facility]. The patient is scheduled for her LINQ implant on 08/05/15 at 8:00 am with Dr. Caryl Comes.  The patient has been notified of the date/ time of her procedure. She verbalizes understanding. Written copy of instructions will be mailed to the patient as well.

## 2015-07-31 ENCOUNTER — Ambulatory Visit (INDEPENDENT_AMBULATORY_CARE_PROVIDER_SITE_OTHER): Payer: Medicare Other | Admitting: Psychiatry

## 2015-07-31 ENCOUNTER — Encounter: Payer: Self-pay | Admitting: Psychiatry

## 2015-07-31 VITALS — BP 120/78 | HR 71 | Temp 97.1°F | Ht 66.0 in | Wt 214.2 lb

## 2015-07-31 DIAGNOSIS — F331 Major depressive disorder, recurrent, moderate: Secondary | ICD-10-CM | POA: Diagnosis not present

## 2015-07-31 DIAGNOSIS — I639 Cerebral infarction, unspecified: Secondary | ICD-10-CM

## 2015-07-31 MED ORDER — PAROXETINE HCL 20 MG PO TABS: 40.0000 mg | ORAL_TABLET | Freq: Every day | ORAL | Status: DC

## 2015-07-31 NOTE — Progress Notes (Signed)
BH MD/PA/NP OP Progress Note  07/31/2015 1:12 PM Jocelyn Wells  MRN:  [id number]  Subjective:   Pt is  a 60 year old female who presented for follow-up appointment. She reported that she was having withdrawal symptoms related to the decrease in dose of Paxil. She called couple of times. Patient reported that she thought it was related to Abilify. She reported that when they saw her, she was so sick that her brother and her cleaning lady has to keep an eye on her. She was having dizziness sweating shakes and was unable to stand up. Patient reported that she stopped taking the Abilify and starting the Paxil again at  40 mg. She felt better. She reported that she does not want to continue with the Abilify again. She is going for her heart and knee issues and is energetic and optimistic about the same. She currently denied having any mood symptoms anger anxiety or paranoia. She appeared calm and alert during the interview. She is walking with the help of cane.    Chief Complaint:   Visit Diagnosis:     ICD-9-CM ICD-10-CM   1. MDD (major depressive disorder), recurrent episode, moderate ([address]) 296.32 F33.1     Past Medical History:  Past Medical History  Diagnosis Date  . Anxiety   . Depression   . Diabetes mellitus, type II ([place])   . PONV (postoperative nausea and vomiting)   . Cardiomyopathy [facility])     new to her Jan 2017  . HTN (hypertension)   . Obesity   . COPD (chronic obstructive pulmonary disease) ([place])   . Stroke [facility])     Jan 2017    Past Surgical History  Procedure Laterality Date  . Abdominal hysterectomy    . Tubal ligation  1978  . Cesarean section  1978  . Knee arthroscopy Left 2005  . Ulnar nerve transposition  2008  . Bilateral salpingoophorectomy  2000  . Ankle fracture surgery Left 2002  . Tee without cardioversion N/A 01/31/2015    Procedure: TRANSESOPHAGEAL ECHOCARDIOGRAM (TEE);  Surgeon: Lelon Perla, MD;  Location: [facility] ENDOSCOPY;  Service: Cardiovascular;   Laterality: N/A;  . Cardiac catheterization N/A 03/25/2015    Procedure: Right/Left Heart Cath and Coronary Angiography;  Surgeon: Belva Crome, MD;  Location: [place] CV LAB;  Service: Cardiovascular;  Laterality: N/A;   Family History:  Family History  Problem Relation Age of Onset  . Heart disease Mother     died from CHF  . Asthma Mother   . Diabetes Mother   . Heart disease Father   . Aneurysm Father   . Diabetes Brother   . Alcohol abuse Paternal Aunt   . Anemia Neg Hx   . Arrhythmia Neg Hx   . Clotting disorder Neg Hx   . Fainting Neg Hx   . Heart attack Neg Hx   . Heart failure Neg Hx   . Hyperlipidemia Neg Hx   . Hypertension Neg Hx    Social History:  Social History   Social History  . Marital Status: Married    Spouse Name: N/A  . Number of Children: N/A  . Years of Education: N/A   Social History Main Topics  . Smoking status: Current Every Day Smoker -- 1.00 packs/day for 35 years    Types: Cigarettes    Start date: 02/08/1974  . Smokeless tobacco: Never Used  . Alcohol Use: No  . Drug Use: No  . Sexual Activity: Yes  Birth Control/ Protection: None   Other Topics Concern  . Not on file   Social History Narrative   Additional History:  Married x 18 years. Has 2 children and Her husband has 2 children as well Assessment:   Musculoskeletal: Strength & Muscle Tone: within normal limits Gait & Station: normal Patient leans: N/A  Psychiatric Specialty Exam: HPI  Review of Systems  Constitutional: Negative for chills and weight loss.  HENT: Negative for nosebleeds and tinnitus.   Eyes: Negative for double vision and discharge.  Respiratory: Positive for cough.   Cardiovascular: Negative for orthopnea.  Gastrointestinal: Positive for nausea and vomiting. Negative for diarrhea and blood in stool.  Genitourinary: Negative for urgency.  Skin: Negative for rash.  Neurological: Positive for sensory change, speech change and focal weakness.  Negative for tingling and headaches.  Endo/Heme/Allergies: Negative for environmental allergies.  Psychiatric/Behavioral: Positive for depression.  All other systems reviewed and are negative.   There were no vitals taken for this visit.There is no weight on file to calculate BMI.  General Appearance: Casual  Eye Contact:  Fair  Speech:  Slow  Volume:  Normal  Mood:  Anxious  Affect:  Congruent  Thought Process:  Coherent  Orientation:  Full (Time, Place, and Person)  Thought Content:  WDL  Suicidal Thoughts:  No  Homicidal Thoughts:  No  Memory:  Immediate;   Fair  Judgement:  Fair  Insight:  Fair  Psychomotor Activity:  Normal  Concentration:  Fair  Recall:  [employer] of Knowledge: Fair  Language: Fair  Akathisia:  No  Handed:  Right  AIMS (if indicated):  none  Assets:  Communication Skills Desire for Improvement Physical Health Social Support  ADL's:  Intact  Cognition: WNL  Sleep:  6-7   Is the patient at risk to self?  No. Has the patient been a risk to self in the past 6 months?  No. Has the patient been a risk to self within the distant past?  No. Is the patient a risk to others?  No. Has the patient been a risk to others in the past 6 months?  No. Has the patient been a risk to others within the distant past?  No.  Current Medications: Current Outpatient Prescriptions  Medication Sig Dispense Refill  . Apremilast (OTEZLA) 30 MG TABS Take 30 mg by mouth 2 (two) times daily.    Marland Kitchen aspirin 325 MG tablet Take 1 tablet (325 mg total) by mouth daily. 30 tablet 0  . atorvastatin (LIPITOR) 10 MG tablet Take 1 tablet (10 mg total) by mouth daily. 90 tablet 3  . carvedilol (COREG) 12.5 MG tablet Take 12.5 mg by mouth 2 (two) times daily.    . clonazePAM (KLONOPIN) 0.5 MG tablet Take 1 tablet (0.5 mg total) by mouth at bedtime. (Patient taking differently: Take 0.5 mg by mouth at bedtime as needed for anxiety. ) 30 tablet 2  . diclofenac (VOLTAREN) 75 MG EC tablet  TAKE 1 TABLET BY MOUTH TWICE A DAY AS NEEDED (Patient taking differently: TAKE 1 TABLET BY MOUTH TWICE A DAY AS NEEDED pain) 60 tablet 3  . glyBURIDE-metformin (GLUCOVANCE) 5-500 MG per tablet Take 2 tablets by mouth 2 (two) times daily.     . Insulin Glargine (TOUJEO SOLOSTAR) 300 UNIT/ML SOPN Inject 40 Units into the skin every evening. (Patient taking differently: Inject 60 Units into the skin every evening. )    . loratadine (CLARITIN) 10 MG tablet Take 1 tablet by  mouth daily.    Marland Kitchen losartan (COZAAR) 50 MG tablet TAKE 1 TABLET BY MOUTH EVERY DAY 30 tablet 6  . PARoxetine (PAXIL) 20 MG tablet Take 1 tablet (20 mg total) by mouth daily. (Patient taking differently: Take 40 mg by mouth daily. ) 90 tablet 0  . spironolactone (ALDACTONE) 25 MG tablet Take 12.5 mg by mouth daily.      No current facility-administered medications for this visit.    Medical Decision Making:  Established Problem, Stable/Improving (1), Review of Psycho-Social Stressors (1) and Review of Last Therapy Session (1)  Treatment Plan Summary:Medication management   Depression and anxiety I will start her on Paxil 40 mg by mouth daily at bedtime to help with the depression. D/c Abilify  She will continue on Klonopin when necessary basis  Follow-up Follow-up in 1 months or earlier depending on her symptoms   More than 50% of the time spent in psychoeducation, counseling and coordination of care.     This note was generated in part or whole with voice recognition software. Voice regonition is usually quite accurate but there are transcription errors that can and very often do occur. I apologize for any typographical errors that were not detected and corrected.   Rainey Pines, MD

## 2015-08-05 ENCOUNTER — Encounter: Payer: Self-pay | Admitting: *Deleted

## 2015-08-05 ENCOUNTER — Ambulatory Visit
Admission: RE | Admit: 2015-08-05 | Discharge: 2015-08-05 | Disposition: A | Discharge status: Home / Self Care | Payer: Medicare Other | Source: Ambulatory Visit | Attending: Internal Medicine | Admitting: Internal Medicine

## 2015-08-05 ENCOUNTER — Encounter
Admission: RE | Disposition: A | Discharge status: Home / Self Care | Payer: Self-pay | Source: Ambulatory Visit | Attending: Internal Medicine

## 2015-08-05 DIAGNOSIS — J449 Chronic obstructive pulmonary disease, unspecified: Secondary | ICD-10-CM | POA: Insufficient documentation

## 2015-08-05 DIAGNOSIS — Z885 Allergy status to narcotic agent status: Secondary | ICD-10-CM | POA: Insufficient documentation

## 2015-08-05 DIAGNOSIS — Z8673 Personal history of transient ischemic attack (TIA), and cerebral infarction without residual deficits: Secondary | ICD-10-CM | POA: Insufficient documentation

## 2015-08-05 DIAGNOSIS — E119 Type 2 diabetes mellitus without complications: Secondary | ICD-10-CM | POA: Diagnosis not present

## 2015-08-05 DIAGNOSIS — Z794 Long term (current) use of insulin: Secondary | ICD-10-CM | POA: Insufficient documentation

## 2015-08-05 DIAGNOSIS — Z7982 Long term (current) use of aspirin: Secondary | ICD-10-CM | POA: Insufficient documentation

## 2015-08-05 DIAGNOSIS — Z90710 Acquired absence of both cervix and uterus: Secondary | ICD-10-CM | POA: Insufficient documentation

## 2015-08-05 DIAGNOSIS — I639 Cerebral infarction, unspecified: Secondary | ICD-10-CM | POA: Diagnosis not present

## 2015-08-05 DIAGNOSIS — E669 Obesity, unspecified: Secondary | ICD-10-CM | POA: Insufficient documentation

## 2015-08-05 DIAGNOSIS — N289 Disorder of kidney and ureter, unspecified: Secondary | ICD-10-CM | POA: Insufficient documentation

## 2015-08-05 DIAGNOSIS — F418 Other specified anxiety disorders: Secondary | ICD-10-CM | POA: Insufficient documentation

## 2015-08-05 DIAGNOSIS — I428 Other cardiomyopathies: Secondary | ICD-10-CM | POA: Insufficient documentation

## 2015-08-05 DIAGNOSIS — I10 Essential (primary) hypertension: Secondary | ICD-10-CM | POA: Diagnosis not present

## 2015-08-05 DIAGNOSIS — Z79899 Other long term (current) drug therapy: Secondary | ICD-10-CM | POA: Insufficient documentation

## 2015-08-05 HISTORY — PX: EP IMPLANTABLE DEVICE: SHX172B

## 2015-08-05 SURGERY — LOOP RECORDER INSERTION
Anesthesia: LOCAL

## 2015-08-05 MED ORDER — LIDOCAINE-EPINEPHRINE (PF) 1 %-1:200000 IJ SOLN
INTRAMUSCULAR | Status: AC
  Filled 2015-08-05: qty 60

## 2015-08-05 SURGICAL SUPPLY — 2 items
LOOP REVEAL LINQSYS (Prosthesis & Implant Heart) ×2 IMPLANT
PACK LOOP INSERTION (CUSTOM PROCEDURE TRAY) ×2 IMPLANT

## 2015-08-07 ENCOUNTER — Encounter: Payer: Medicare Other | Admitting: Internal Medicine

## 2015-08-14 ENCOUNTER — Ambulatory Visit (INDEPENDENT_AMBULATORY_CARE_PROVIDER_SITE_OTHER): Payer: Medicare Other | Admitting: Internal Medicine

## 2015-08-14 ENCOUNTER — Encounter: Payer: Self-pay | Admitting: Internal Medicine

## 2015-08-14 VITALS — Ht 66.0 in

## 2015-08-14 DIAGNOSIS — I5022 Chronic systolic (congestive) heart failure: Secondary | ICD-10-CM

## 2015-08-14 NOTE — Progress Notes (Signed)
Wound looks good steristrips removed

## 2015-08-18 ENCOUNTER — Ambulatory Visit: Payer: Medicare Other

## 2015-08-20 ENCOUNTER — Other Ambulatory Visit: Payer: Self-pay | Admitting: Psychiatry

## 2015-08-22 ENCOUNTER — Telehealth: Payer: Self-pay | Admitting: Cardiology

## 2015-08-22 NOTE — Telephone Encounter (Signed)
LMOVM requesting that pt send manual transmission b/c home monitor has not updated in at least 14 days.    

## 2015-08-25 ENCOUNTER — Telehealth: Payer: Self-pay | Admitting: Cardiology

## 2015-08-25 NOTE — Telephone Encounter (Signed)
Pt returning my call. Spoke w/ pt and informed her that her home monitor has not updated. Instructed pt to send a remote transmission. Transmission received and pt is aware.

## 2015-08-28 ENCOUNTER — Ambulatory Visit (INDEPENDENT_AMBULATORY_CARE_PROVIDER_SITE_OTHER): Payer: Medicare Other | Admitting: Psychiatry

## 2015-08-28 ENCOUNTER — Encounter: Payer: Self-pay | Admitting: Psychiatry

## 2015-08-28 VITALS — BP 125/75 | HR 71 | Temp 97.3°F | Ht 66.0 in | Wt 212.2 lb

## 2015-08-28 DIAGNOSIS — I639 Cerebral infarction, unspecified: Secondary | ICD-10-CM

## 2015-08-28 DIAGNOSIS — F331 Major depressive disorder, recurrent, moderate: Secondary | ICD-10-CM

## 2015-08-28 MED ORDER — PAROXETINE HCL 40 MG PO TABS: 40.0000 mg | ORAL_TABLET | ORAL | Status: DC

## 2015-08-28 NOTE — Progress Notes (Signed)
BH MD/PA/NP OP Progress Note  08/28/2015 1:35 PM Jocelyn Wells  MRN:  [id number]  Subjective:   Pt is  a 60 year old female who presented for follow-up appointment. She reported that she just returned from a trip to [place] where she went with her family. She reported that she enjoyed her trip with her family on a cruise trip. She appeared calm during the interview. She reported that she has been compliant with her medications. She also has a pacemaker placement in the late June and she has been doing well. She reported that she does not have any side effects of the medication. Her mood is improving and she is feeling that her depressive symptoms are getting better. She currently denied having any suicidal homicidal ideations or plans. She reported that she takes them as prescribed.     Chief Complaint:  Chief Complaint    Follow-up; Medication Refill     Visit Diagnosis:     ICD-9-CM ICD-10-CM   1. MDD (major depressive disorder), recurrent episode, moderate ([place]) 296.32 F33.1     Past Medical History:  Past Medical History  Diagnosis Date  . Anxiety   . Depression   . Diabetes mellitus, type II ([place])   . PONV (postoperative nausea and vomiting)   . Cardiomyopathy [facility])     new to her Jan 2017  . HTN (hypertension)   . Obesity   . COPD (chronic obstructive pulmonary disease) ([place])   . Stroke [facility])     Jan 2017    Past Surgical History  Procedure Laterality Date  . Abdominal hysterectomy    . Tubal ligation  1978  . Cesarean section  1978  . Knee arthroscopy Left 2005  . Ulnar nerve transposition  2008  . Bilateral salpingoophorectomy  2000  . Ankle fracture surgery Left 2002  . Tee without cardioversion N/A 01/31/2015    Procedure: TRANSESOPHAGEAL ECHOCARDIOGRAM (TEE);  Surgeon: Lelon Perla, MD;  Location: [facility] ENDOSCOPY;  Service: Cardiovascular;  Laterality: N/A;  . Cardiac catheterization N/A 03/25/2015    Procedure: Right/Left Heart Cath and Coronary Angiography;   Surgeon: Belva Crome, MD;  Location: [place] CV LAB;  Service: Cardiovascular;  Laterality: N/A;  . Ep implantable device N/A 08/05/2015    Procedure: Loop Recorder Insertion;  Surgeon: Deboraha Sprang, MD;  Location: [place] [facility];  Service: Cardiovascular;  Laterality: N/A;   Family History:  Family History  Problem Relation Age of Onset  . Heart disease Mother     died from CHF  . Asthma Mother   . Diabetes Mother   . Heart disease Father   . Aneurysm Father   . Diabetes Brother   . Alcohol abuse Paternal Aunt   . Anemia Neg Hx   . Arrhythmia Neg Hx   . Clotting disorder Neg Hx   . Fainting Neg Hx   . Heart attack Neg Hx   . Heart failure Neg Hx   . Hyperlipidemia Neg Hx   . Hypertension Neg Hx    Social History:  Social History   Social History  . Marital Status: Married    Spouse Name: N/A  . Number of Children: N/A  . Years of Education: N/A   Social History Main Topics  . Smoking status: Current Every Day Smoker -- 1.00 packs/day for 35 years    Types: Cigarettes    Start date: 02/08/1974  . Smokeless tobacco: Never Used  . Alcohol Use: No  . Drug Use: No  .  Sexual Activity: Yes    Birth Control/ Protection: None   Other Topics Concern  . None   Social History Narrative   Additional History:  Married x 18 years. Has 2 children and Her husband has 2 children as well Assessment:   Musculoskeletal: Strength & Muscle Tone: within normal limits Gait & Station: normal Patient leans: N/A  Psychiatric Specialty Exam: HPI  Review of Systems  Constitutional: Negative for chills and weight loss.  HENT: Negative for nosebleeds and tinnitus.   Eyes: Negative for double vision and discharge.  Respiratory: Positive for cough.   Cardiovascular: Negative for orthopnea.  Gastrointestinal: Positive for nausea and vomiting. Negative for diarrhea and blood in stool.  Genitourinary: Negative for urgency.  Skin: Negative for rash.  Neurological:  Positive for sensory change, speech change and focal weakness. Negative for tingling and headaches.  Endo/Heme/Allergies: Negative for environmental allergies.  Psychiatric/Behavioral: Positive for depression.  All other systems reviewed and are negative.   Blood pressure 125/75, pulse 71, temperature 97.3 F (36.3 C), temperature source Oral, height 5\' 6"  (1.676 m), weight 212 lb 3.2 oz (96.253 kg).Body mass index is 34.27 kg/(m^2).  General Appearance: Casual  Eye Contact:  Fair  Speech:  Slow  Volume:  Normal  Mood:  Anxious  Affect:  Congruent  Thought Process:  Coherent  Orientation:  Full (Time, Place, and Person)  Thought Content:  WDL  Suicidal Thoughts:  No  Homicidal Thoughts:  No  Memory:  Immediate;   Fair  Judgement:  Fair  Insight:  Fair  Psychomotor Activity:  Normal  Concentration:  Fair  Recall:  [employer] of Knowledge: Fair  Language: Fair  Akathisia:  No  Handed:  Right  AIMS (if indicated):  none  Assets:  Communication Skills Desire for Improvement Physical Health Social Support  ADL's:  Intact  Cognition: WNL  Sleep:  6-7   Is the patient at risk to self?  No. Has the patient been a risk to self in the past 6 months?  No. Has the patient been a risk to self within the distant past?  No. Is the patient a risk to others?  No. Has the patient been a risk to others in the past 6 months?  No. Has the patient been a risk to others within the distant past?  No.  Current Medications: Current Outpatient Prescriptions  Medication Sig Dispense Refill  . Apremilast (OTEZLA) 30 MG TABS Take 30 mg by mouth 2 (two) times daily.    Marland Kitchen aspirin 325 MG tablet Take 1 tablet (325 mg total) by mouth daily. 30 tablet 0  . atorvastatin (LIPITOR) 10 MG tablet Take 1 tablet (10 mg total) by mouth daily. 90 tablet 3  . BYDUREON 2 MG SRER     . carvedilol (COREG) 12.5 MG tablet Take 12.5 mg by mouth 2 (two) times daily.    . clonazePAM (KLONOPIN) 0.5 MG tablet Take one  tablet (0.5 mg) by mouth once daily at bedtime as needed    . diclofenac (VOLTAREN) 75 MG EC tablet Take one tablet (75 mg) by mouth twice daily as needed    . glyBURIDE-metformin (GLUCOVANCE) 5-500 MG per tablet Take 2 tablets by mouth 2 (two) times daily.     Marland Kitchen loratadine (CLARITIN) 10 MG tablet Take 1 tablet by mouth daily.    Marland Kitchen losartan (COZAAR) 50 MG tablet TAKE 1 TABLET BY MOUTH EVERY DAY 30 tablet 6  . PARoxetine (PAXIL) 40 MG tablet Take 40 mg  by mouth every morning.    Marland Kitchen spironolactone (ALDACTONE) 25 MG tablet Take 1/2 tablet (12.5 mg) by mouth once daily    . TOUJEO SOLOSTAR 300 UNIT/ML SOPN   1   No current facility-administered medications for this visit.    Medical Decision Making:  Established Problem, Stable/Improving (1), Review of Psycho-Social Stressors (1) and Review of Last Therapy Session (1)  Treatment Plan Summary:Medication management   Depression and anxiety I will start her on Paxil 40 mg by mouth daily at bedtime to help with the depression.  She will continue on Klonopin when necessary basis  Follow-up Follow-up in 3 months or earlier depending on her symptoms   More than 50% of the time spent in psychoeducation, counseling and coordination of care.     This note was generated in part or whole with voice recognition software. Voice regonition is usually quite accurate but there are transcription errors that can and very often do occur. I apologize for any typographical errors that were not detected and corrected.   Rainey Pines, MD

## 2015-09-04 ENCOUNTER — Ambulatory Visit (INDEPENDENT_AMBULATORY_CARE_PROVIDER_SITE_OTHER): Payer: Medicare Other | Admitting: *Deleted

## 2015-09-04 DIAGNOSIS — I639 Cerebral infarction, unspecified: Secondary | ICD-10-CM | POA: Diagnosis not present

## 2015-09-04 NOTE — Progress Notes (Signed)
Carelink Summary Report / Loop Recorder 

## 2015-09-17 DIAGNOSIS — M1711 Unilateral primary osteoarthritis, right knee: Secondary | ICD-10-CM | POA: Diagnosis not present

## 2015-09-17 DIAGNOSIS — G8929 Other chronic pain: Secondary | ICD-10-CM | POA: Diagnosis not present

## 2015-09-17 DIAGNOSIS — M25561 Pain in right knee: Secondary | ICD-10-CM | POA: Diagnosis not present

## 2015-09-19 LAB — CUP PACEART REMOTE DEVICE CHECK: Date Time Interrogation Session: 20170727120547

## 2015-09-24 DIAGNOSIS — M25561 Pain in right knee: Secondary | ICD-10-CM | POA: Diagnosis not present

## 2015-09-24 DIAGNOSIS — G8929 Other chronic pain: Secondary | ICD-10-CM | POA: Diagnosis not present

## 2015-09-24 DIAGNOSIS — M1711 Unilateral primary osteoarthritis, right knee: Secondary | ICD-10-CM | POA: Diagnosis not present

## 2015-09-29 ENCOUNTER — Encounter: Payer: Self-pay | Admitting: *Deleted

## 2015-10-01 DIAGNOSIS — M25561 Pain in right knee: Secondary | ICD-10-CM | POA: Diagnosis not present

## 2015-10-01 DIAGNOSIS — G8929 Other chronic pain: Secondary | ICD-10-CM | POA: Diagnosis not present

## 2015-10-01 DIAGNOSIS — M1711 Unilateral primary osteoarthritis, right knee: Secondary | ICD-10-CM | POA: Diagnosis not present

## 2015-10-06 ENCOUNTER — Ambulatory Visit (INDEPENDENT_AMBULATORY_CARE_PROVIDER_SITE_OTHER): Payer: Medicare Other | Admitting: *Deleted

## 2015-10-06 DIAGNOSIS — I639 Cerebral infarction, unspecified: Secondary | ICD-10-CM

## 2015-10-07 DIAGNOSIS — E1165 Type 2 diabetes mellitus with hyperglycemia: Secondary | ICD-10-CM | POA: Diagnosis not present

## 2015-10-07 DIAGNOSIS — Z794 Long term (current) use of insulin: Secondary | ICD-10-CM | POA: Diagnosis not present

## 2015-10-07 DIAGNOSIS — E1122 Type 2 diabetes mellitus with diabetic chronic kidney disease: Secondary | ICD-10-CM | POA: Diagnosis not present

## 2015-10-07 DIAGNOSIS — N183 Chronic kidney disease, stage 3 (moderate): Secondary | ICD-10-CM | POA: Diagnosis not present

## 2015-10-07 NOTE — Progress Notes (Signed)
Carelink Summary Report / Loop Recorder 

## 2015-10-14 DIAGNOSIS — E1129 Type 2 diabetes mellitus with other diabetic kidney complication: Secondary | ICD-10-CM | POA: Diagnosis not present

## 2015-10-14 DIAGNOSIS — E1122 Type 2 diabetes mellitus with diabetic chronic kidney disease: Secondary | ICD-10-CM | POA: Diagnosis not present

## 2015-10-14 DIAGNOSIS — R809 Proteinuria, unspecified: Secondary | ICD-10-CM | POA: Diagnosis not present

## 2015-10-14 DIAGNOSIS — N183 Chronic kidney disease, stage 3 (moderate): Secondary | ICD-10-CM | POA: Diagnosis not present

## 2015-10-14 DIAGNOSIS — E113393 Type 2 diabetes mellitus with moderate nonproliferative diabetic retinopathy without macular edema, bilateral: Secondary | ICD-10-CM | POA: Diagnosis not present

## 2015-10-14 DIAGNOSIS — Z794 Long term (current) use of insulin: Secondary | ICD-10-CM | POA: Diagnosis not present

## 2015-10-14 DIAGNOSIS — F17200 Nicotine dependence, unspecified, uncomplicated: Secondary | ICD-10-CM | POA: Diagnosis not present

## 2015-10-14 DIAGNOSIS — E1165 Type 2 diabetes mellitus with hyperglycemia: Secondary | ICD-10-CM | POA: Diagnosis not present

## 2015-10-20 ENCOUNTER — Other Ambulatory Visit: Payer: Self-pay

## 2015-10-20 NOTE — Telephone Encounter (Signed)
Refill request received from [facility] requesting a 90 day supply of Diclofenac

## 2015-10-21 ENCOUNTER — Other Ambulatory Visit: Payer: Self-pay

## 2015-10-21 NOTE — Telephone Encounter (Signed)
[facility] from [facility] requesting a 90 days supply of Diclofenac 75 mg

## 2015-10-21 NOTE — Telephone Encounter (Signed)
Hospital discharge note from Dr. Caryl Comes (cardiologist) shows he stopped the Diclofenac due to CHF. Patient will need to check with him to see if another drug is safer to use.

## 2015-10-23 ENCOUNTER — Telehealth: Payer: Self-pay | Admitting: Internal Medicine

## 2015-10-23 NOTE — Telephone Encounter (Signed)
I called and spoke with the patient. She states that her PCP office won't fill her voltaren as we had taken her off of this. I advised the patient that I do not see anywhere in her chart that Dr. Caryl Comes had stopped this. She request that I call [name]'s office at [facility] [phone] and discuss this with them. I tried to call at 4:57pm, but the office recording stated they were already closed. I will call back in the morning.

## 2015-10-23 NOTE — Telephone Encounter (Signed)
Patient advised as stated below. Patient said she was not aware that the Diclofenac had been discontinued. Patient will call Dr. Olin Pia office to see if she should stop taking the medication or continue. Patient states she will call us back to let us know what Dr. Caryl Comes says.

## 2015-10-23 NOTE — Telephone Encounter (Signed)
Patient called and wants to know if she is supposed to keep taking diclofenac (VOLTAREN) 75 MG EC tablet. Please call patient.

## 2015-10-24 NOTE — Telephone Encounter (Signed)
Heather with Dr. Olin Pia office stated that pt called about the Diclofenac. Heather advised that Dr. Caryl Comes would be back in the office first part of next week and they will look into this and call back. Thanks TNP

## 2015-10-24 NOTE — Telephone Encounter (Signed)
I called and spoke with [facility] today. They state that they saw on the patient's medication list that this had been discontinued by our office- this was entered under my name on 08/14/15. After investigating this further, the patient's initial voltaren RX had multiple directions on it, therefore, when verified with the patient, directions were clarified and re-entered to reflect how the patient was taking this. I advised the staff at [facility] I would still review Voltaren with Dr. Caryl Comes and call them back.

## 2015-10-27 NOTE — Telephone Encounter (Signed)
Thanks for sorting all that out.  I have no problems with her being on voltaren

## 2015-10-28 ENCOUNTER — Encounter: Payer: Self-pay | Admitting: Adult Health

## 2015-10-28 ENCOUNTER — Ambulatory Visit (INDEPENDENT_AMBULATORY_CARE_PROVIDER_SITE_OTHER): Payer: Medicare Other | Admitting: Adult Health

## 2015-10-28 VITALS — BP 127/81 | HR 80 | Ht 66.0 in | Wt 222.2 lb

## 2015-10-28 DIAGNOSIS — I639 Cerebral infarction, unspecified: Secondary | ICD-10-CM | POA: Diagnosis not present

## 2015-10-28 DIAGNOSIS — Z8673 Personal history of transient ischemic attack (TIA), and cerebral infarction without residual deficits: Secondary | ICD-10-CM | POA: Diagnosis not present

## 2015-10-28 NOTE — Patient Instructions (Signed)
Continue aspirin 325 mg daily  for secondary stroke prevention and maintain strict control of hypertension with blood pressure goal below 130/90, diabetes with hemoglobin A1c goal below 6.5% and lipids with LDL cholesterol goal below 70 mg/dL

## 2015-10-28 NOTE — Progress Notes (Addendum)
PATIENT: Jocelyn Wells DOB: 09/05/1955  REASON FOR VISIT: follow up- history of stroke HISTORY FROM: patient  HISTORY OF PRESENT ILLNESS: Jocelyn Wells is a 60 year old female with a history of stroke. She returns today for follow-up. She is currently on aspirin and tolerating it well. Today her blood pressure is in normal range. She states that she has not had a recent follow-up with her primary care. She does have an endocrinologist who she saw in August. Her last hemoglobin A1c was 10.3. She states that since her stroke she has not had any residual symptoms. She has also not developed any new neurological symptoms. She does report being forgetful at times but she feels this is related to her age. She lives at home with her spouse and stepson. She is able to complete all ADLs independently. She prepared meals without difficulty. She states that she does not sleep well at night. She does have a psychiatrist who is managing her depression. She returns today for an evaluation.  HISTORY 04/23/15: 55 year Caucasian lady seen today for first office follow-up visit following hospital admission for stroke in December 2016. Jocelyn Wells is a 60 y.o. female with a history of DM, HTN who presented with sudden onset right hand weakness, right sided numbness and mild word finding difficulty that started at 2pm 01/29/2015 ([facility]). She states that she noticed that she was slurring her speech. She was brought into the emergency room where a code stroke was activated. Extensive discussion was had with the patient and her son. With an NIH of 3, it is not typical to offer TPA and therefore I discussed this with the son and patient who agreed holding off on TPA at this time. Patient was not administered TPA secondary to mild symptoms. She was admitted for further evaluation and treatment. MRI scan of the brain showed a patchy small left MCA branch infarct involving posterior left frontal lobe and insular cortex. CT  angiogram of the brain showed a high-grade stenosis of the left M3 branch of the middle cerebral artery corresponding to the patient's symptoms. Carotid ultrasound was unremarkable. Transthoracic echo showed diminished ejection fraction of 30-35% without definite clot noted. Transesophageal echocardiogram showed no definite clot but showed severe global reduction in the left ventricular function. Patient underwent outpatient cardiac catheterization on 03/25/15 which confirmed low ejection fraction but did not show significant coronary artery stenosis. Hemoglobin A1c was elevated at 8.4. LDL cholesterol was 89 mg percent. Patient for 30 day outpatient cardiac monitor but the results of it are yet elevated. She was seen by Dr. Caryl Comes for consideration for loop recorder insertion but he plans to wait 2 months and repeat her echocardiogram. Patient also states that she snores a lot and tires easily and has excessive daytime sleepiness and polysomnogram for detection of sleep apnea is being considered but not yet ordered. She states she's done well speech has recovered quite well. She still has occasional dizzy spells and feels her mental processing skills are slightly diminished. She has no other complaints.  REVIEW OF SYSTEMS: Out of a complete 14 system review of symptoms, the patient complains only of the following symptoms, and all other reviewed systems are negative.  Walking difficulty, joint pain, depression, nervous/anxious, daytime sleepiness, cough, runny nose  ALLERGIES: Allergies  Allergen Reactions  . Codeine Nausea And Vomiting    HOME MEDICATIONS: Outpatient Medications Prior to Visit  Medication Sig Dispense Refill  . aspirin 325 MG tablet Take 1 tablet (325 mg  total) by mouth daily. 30 tablet 0  . atorvastatin (LIPITOR) 10 MG tablet Take 1 tablet (10 mg total) by mouth daily. 90 tablet 3  . BYDUREON 2 MG SRER     . carvedilol (COREG) 12.5 MG tablet Take 12.5 mg by mouth 2 (two) times  daily.    . clonazePAM (KLONOPIN) 0.5 MG tablet Take one tablet (0.5 mg) by mouth once daily at bedtime as needed    . diclofenac (VOLTAREN) 75 MG EC tablet Take one tablet (75 mg) by mouth twice daily as needed    . glyBURIDE-metformin (GLUCOVANCE) 5-500 MG per tablet Take 2 tablets by mouth 2 (two) times daily.     Marland Kitchen loratadine (CLARITIN) 10 MG tablet Take 1 tablet by mouth daily.    Marland Kitchen losartan (COZAAR) 50 MG tablet TAKE 1 TABLET BY MOUTH EVERY DAY 30 tablet 6  . PARoxetine (PAXIL) 40 MG tablet Take 1 tablet (40 mg total) by mouth every morning. 90 tablet 1  . spironolactone (ALDACTONE) 25 MG tablet Take 1/2 tablet (12.5 mg) by mouth once daily    . Apremilast (OTEZLA) 30 MG TABS Take 30 mg by mouth 2 (two) times daily.    Nelva Nay SOLOSTAR 300 UNIT/ML SOPN   1   No facility-administered medications prior to visit.     PAST MEDICAL HISTORY: Past Medical History:  Diagnosis Date  . Anxiety   . Cardiomyopathy [facility])    new to her Jan 2017  . COPD (chronic obstructive pulmonary disease) ([place])   . Depression   . Diabetes mellitus, type II ([place])   . HTN (hypertension)   . Obesity   . PONV (postoperative nausea and vomiting)   . Stroke [facility])    Jan 2017    PAST SURGICAL HISTORY: Past Surgical History:  Procedure Laterality Date  . ABDOMINAL HYSTERECTOMY    . ANKLE FRACTURE SURGERY Left 2002  . BILATERAL SALPINGOOPHORECTOMY  2000  . CARDIAC CATHETERIZATION N/A 03/25/2015   Procedure: Right/Left Heart Cath and Coronary Angiography;  Surgeon: Belva Crome, MD;  Location: [place] CV LAB;  Service: Cardiovascular;  Laterality: N/A;  . [place]  . EP IMPLANTABLE DEVICE N/A 08/05/2015   Procedure: Loop Recorder Insertion;  Surgeon: Deboraha Sprang, MD;  Location: [place] CV LAB;  Service: Cardiovascular;  Laterality: N/A;  . KNEE ARTHROSCOPY Left 2005  . TEE WITHOUT CARDIOVERSION N/A 01/31/2015   Procedure: TRANSESOPHAGEAL ECHOCARDIOGRAM (TEE);  Surgeon: Lelon Perla, MD;  Location: [place];  Service: Cardiovascular;  Laterality: N/A;  . [place]  . ULNAR NERVE TRANSPOSITION  2008    FAMILY HISTORY: Family History  Problem Relation Age of Onset  . Heart disease Mother     died from CHF  . Asthma Mother   . Diabetes Mother   . Heart disease Father   . Aneurysm Father   . Diabetes Brother   . Alcohol abuse Paternal Aunt   . Anemia Neg Hx   . Arrhythmia Neg Hx   . Clotting disorder Neg Hx   . Fainting Neg Hx   . Heart attack Neg Hx   . Heart failure Neg Hx   . Hyperlipidemia Neg Hx   . Hypertension Neg Hx     SOCIAL HISTORY: Social History   Social History  . Marital status: Married    Spouse name: N/A  . Number of children: N/A  . Years of education: N/A   Occupational History  . Not  on file.   Social History Main Topics  . Smoking status: Current Every Day Smoker    Packs/day: 1.00    Years: 35.00    Types: Cigarettes    Start date: 02/08/1974  . Smokeless tobacco: Never Used  . Alcohol use No  . Drug use: No  . Sexual activity: Yes    Birth control/ protection: None   Other Topics Concern  . Not on file   Social History Narrative   Lives at home with stepson and husband, dogs and cats   Caffeine  Drinks sweet tea.   Right handed.       PHYSICAL EXAM  Vitals:   10/28/15 1054  BP: 127/81  Pulse: 80  Weight: 222 lb 3.2 oz (100.8 kg)  Height: 5\' 6"  (1.676 m)   Body mass index is 35.86 kg/m.  Generalized: Well developed, in no acute distress   Neurological examination  Mentation: Alert oriented to time, place, history taking. Follows all commands speech and language fluent Cranial nerve II-XII: Pupils were equal round reactive to light. Extraocular movements were full, visual field were full on confrontational test. Facial sensation and strength were normal. Uvula tongue midline. Head turning and shoulder shrug  were normal and symmetric. Motor: The motor testing reveals 5 over 5  strength of all 4 extremities. Good symmetric motor tone is noted throughout.  Sensory: Sensory testing is intact to soft touch on all 4 extremities. No evidence of extinction is noted.  Coordination: Cerebellar testing reveals good finger-nose-finger and heel-to-shin bilaterally.  Gait and station: Gait is normal. Tandem gait is unsteady. Romberg is negative. Reflexes: Deep tendon reflexes are symmetric and normal bilaterally.   DIAGNOSTIC DATA (LABS, IMAGING, TESTING) - I reviewed patient records, labs, notes, testing and imaging myself where available.  Lab Results  Component Value Date   WBC 12.1 (H) 06/02/2015   HGB 12.2 06/02/2015   HCT 36.4 06/02/2015   MCV 84.8 06/02/2015   PLT 172 06/02/2015      Component Value Date/Time   NA 137 06/02/2015 0450   NA 142 02/11/2015 1247   K 4.7 06/02/2015 0450   CL 104 06/02/2015 0450   CO2 23 06/02/2015 0450   GLUCOSE 273 (H) 06/02/2015 0450   BUN 38 (H) 06/02/2015 0450   BUN 24 02/11/2015 1247   CREATININE 1.68 (H) 06/02/2015 0450   CREATININE 1.37 (H) 03/21/2015 1451   CALCIUM 8.9 06/02/2015 0450   PROT 6.4 02/11/2015 1247   ALBUMIN 4.1 02/11/2015 1247   AST 15 02/11/2015 1247   ALT 20 02/11/2015 1247   ALKPHOS 89 02/11/2015 1247   BILITOT <0.2 02/11/2015 1247   GFRNONAA 32 (L) 06/02/2015 0450   GFRAA 37 (L) 06/02/2015 0450        ASSESSMENT AND PLAN 61 y.o. year old female  has a past medical history of Anxiety; Cardiomyopathy ([place]); COPD (chronic obstructive pulmonary disease) ([place]); Depression; Diabetes mellitus, type II ([place]); HTN (hypertension); Obesity; PONV (postoperative nausea and vomiting); and Stroke ([place]). here with:  1. History of CVA  Overall the patient has done well. Continue aspirin 325 mg daily  for secondary stroke prevention and maintain strict control of hypertension with blood pressure goal below 130/90, diabetes with hemoglobin A1c goal below 6.5% and lipids with LDL cholesterol goal below 70 mg/dL.  Patient is advised to monitor her diet and exercise daily. She is advised that if she develops any strokelike symptoms she should go to the emergency room. She will follow-up in 6  months or sooner if needed.    Ward Givens, MSN, NP-C 10/28/2015, 11:14 AM [facility] [address] [place], [place] [address] [phone]  I reviewed the above note and documentation by the Nurse Practitioner and agree with the history, physical exam, assessment and plan as outlined above. I was immediately available for face-to-face consultation. Star Age, MD, PhD [facility])

## 2015-10-29 NOTE — Telephone Encounter (Signed)
I attempted to call [facility] back today.  I was advised that Vernie Murders, PA was out of the office on Wednesday. I advised staff I would forward this information to him through [employer] with Dr. Olin Pia recommendations on her Voltaren.

## 2015-10-30 ENCOUNTER — Other Ambulatory Visit: Payer: Self-pay | Admitting: Family Medicine

## 2015-10-30 DIAGNOSIS — Z8739 Personal history of other diseases of the musculoskeletal system and connective tissue: Secondary | ICD-10-CM

## 2015-10-30 MED ORDER — DICLOFENAC SODIUM 75 MG PO TBEC: 75.0000 mg | DELAYED_RELEASE_TABLET | Freq: Two times a day (BID) | ORAL | 2 refills | Status: DC

## 2015-11-01 LAB — CUP PACEART REMOTE DEVICE CHECK: MDC IDC SESS DTM: 20170826120535

## 2015-11-01 NOTE — Progress Notes (Signed)
Carelink summary report received. Battery status OK. Normal device function. No new symptom episodes, tachy episodes, brady, or pause episodes. No new AF episodes. Monthly summary reports and ROV/PRN 

## 2015-11-03 ENCOUNTER — Ambulatory Visit (INDEPENDENT_AMBULATORY_CARE_PROVIDER_SITE_OTHER): Payer: Medicare Other | Admitting: *Deleted

## 2015-11-03 DIAGNOSIS — I639 Cerebral infarction, unspecified: Secondary | ICD-10-CM | POA: Diagnosis not present

## 2015-11-03 NOTE — Progress Notes (Signed)
Carelink Summary Report / Loop Recorder 

## 2015-11-07 ENCOUNTER — Ambulatory Visit (INDEPENDENT_AMBULATORY_CARE_PROVIDER_SITE_OTHER): Payer: Medicare Other | Admitting: Family Medicine

## 2015-11-07 ENCOUNTER — Encounter: Payer: Self-pay | Admitting: Family Medicine

## 2015-11-07 VITALS — BP 132/84 | HR 84 | Temp 97.8°F | Resp 16 | Wt 223.4 lb

## 2015-11-07 DIAGNOSIS — I639 Cerebral infarction, unspecified: Secondary | ICD-10-CM

## 2015-11-07 DIAGNOSIS — Z23 Encounter for immunization: Secondary | ICD-10-CM

## 2015-11-07 DIAGNOSIS — L03011 Cellulitis of right finger: Secondary | ICD-10-CM | POA: Diagnosis not present

## 2015-11-07 MED ORDER — DOXYCYCLINE HYCLATE 100 MG PO TABS: 100.0000 mg | ORAL_TABLET | Freq: Two times a day (BID) | ORAL | 0 refills | Status: DC

## 2015-11-07 NOTE — Patient Instructions (Signed)
Paronychia  °Paronychia is an infection of the skin. It happens near a fingernail or toenail. It may cause pain and swelling around the nail. Usually, it is not serious and it clears up with treatment. °HOME CARE °· Soak the fingers or toes in warm water as told by your doctor. You may be told to do this for 20 minutes, 2-3 times a day. °· Keep the area dry when you are not soaking it. °· Take medicines only as told by your doctor. °· If you were given an antibiotic medicine, finish all of it even if you start to feel better. °· Keep the affected area clean. °· Do not try to drain a fluid-filled bump yourself. °· Wear rubber gloves when putting your hands in water. °· Wear gloves if your hands might touch cleaners or chemicals. °· Follow your doctor's instructions about: °¨ Wound care. °¨ Bandage (dressing) changes and removal. °GET HELP IF: °· Your symptoms get worse or do not improve. °· You have a fever or chills. °· You have redness spreading from the affected area. °· You have more fluid, blood, or pus coming from the affected area. °· Your finger or knuckle is swollen or is hard to move. °  °This information is not intended to replace advice given to you by your health care provider. Make sure you discuss any questions you have with your health care provider. °  °Document Released: 01/13/2009 Document Revised: 06/11/2014 Document Reviewed: 01/02/2014 °Elsevier Interactive Patient Education ©2016 [employer] ° °

## 2015-11-07 NOTE — Progress Notes (Signed)
Patient: Jocelyn Wells Female    DOB: [birth year]   60 y.o.   MRN: [id number] Visit Date: 11/07/2015  Today's Provider: Vernie Murders, PA   Chief Complaint  Patient presents with  . Hand Pain   Subjective:    Hand Pain   The incident occurred 3 to 5 days ago. There was no injury mechanism. Pain location: right thumb. The quality of the pain is described as aching. The pain has been constant since the incident. Associated symptoms comments: Swelling, warm to the touch, and discoloration of thumb. She has tried nothing for the symptoms.   Past Medical History:  Diagnosis Date  . Anxiety   . Cardiomyopathy [facility])    new to her Jan 2017  . COPD (chronic obstructive pulmonary disease) ([place])   . Depression   . Diabetes mellitus, type II ([place])   . HTN (hypertension)   . Obesity   . PONV (postoperative nausea and vomiting)   . Stroke [facility])    Jan 2017   Past Surgical History:  Procedure Laterality Date  . ABDOMINAL HYSTERECTOMY    . ANKLE FRACTURE SURGERY Left 2002  . BILATERAL SALPINGOOPHORECTOMY  2000  . CARDIAC CATHETERIZATION N/A 03/25/2015   Procedure: Right/Left Heart Cath and Coronary Angiography;  Surgeon: Belva Crome, MD;  Location: [place] CV [facility];  Service: Cardiovascular;  Laterality: N/A;  . [place]  . EP IMPLANTABLE DEVICE N/A 08/05/2015   Procedure: Loop Recorder Insertion;  Surgeon: Deboraha Sprang, MD;  Location: [place] [facility];  Service: Cardiovascular;  Laterality: N/A;  . KNEE ARTHROSCOPY Left 2005  . TEE WITHOUT CARDIOVERSION N/A 01/31/2015   Procedure: TRANSESOPHAGEAL ECHOCARDIOGRAM (TEE);  Surgeon: Lelon Perla, MD;  Location: [place];  Service: Cardiovascular;  Laterality: N/A;  . [place]  . ULNAR NERVE TRANSPOSITION  2008   Family History  Problem Relation Age of Onset  . Heart disease Mother     died from CHF  . Asthma Mother   . Diabetes Mother   . Heart disease Father   . Aneurysm Father   .  Diabetes Brother   . Alcohol abuse Paternal Aunt   . Anemia Neg Hx   . Arrhythmia Neg Hx   . Clotting disorder Neg Hx   . Fainting Neg Hx   . Heart attack Neg Hx   . Heart failure Neg Hx   . Hyperlipidemia Neg Hx   . Hypertension Neg Hx    Allergies  Allergen Reactions  . Codeine Nausea And Vomiting     Previous Medications   APREMILAST (OTEZLA) 30 MG TABS    Take 30 mg by mouth 2 (two) times daily.   ASPIRIN 325 MG TABLET    Take 1 tablet (325 mg total) by mouth daily.   ATORVASTATIN (LIPITOR) 10 MG TABLET    Take 1 tablet (10 mg total) by mouth daily.   BYDUREON 2 MG SRER       CARVEDILOL (COREG) 12.5 MG TABLET    Take 12.5 mg by mouth 2 (two) times daily.   CLONAZEPAM (KLONOPIN) 0.5 MG TABLET    Take one tablet (0.5 mg) by mouth once daily at bedtime as needed   DICLOFENAC (VOLTAREN) 75 MG EC TABLET    Take 1 tablet (75 mg total) by mouth 2 (two) times daily. Take one tablet (75 mg) by mouth twice daily as needed   GLYBURIDE-METFORMIN (GLUCOVANCE) 5-500 MG PER TABLET    Take 2  tablets by mouth 2 (two) times daily.    LORATADINE (CLARITIN) 10 MG TABLET    Take 1 tablet by mouth daily.   LOSARTAN (COZAAR) 50 MG TABLET    TAKE 1 TABLET BY MOUTH EVERY DAY   NOVOLIN 70/30 RELION (70-30) 100 UNIT/ML INJECTION    Inject 35 Units into the skin 2 (two) times daily with a meal.    PAROXETINE (PAXIL) 40 MG TABLET    Take 1 tablet (40 mg total) by mouth every morning.   SPIRONOLACTONE (ALDACTONE) 25 MG TABLET    Take 1/2 tablet (12.5 mg) by mouth once daily    Review of Systems  Constitutional: Negative.   Respiratory: Negative.   Cardiovascular: Negative.     Social History  Substance Use Topics  . Smoking status: Current Every Day Smoker    Packs/day: 1.00    Years: 35.00    Types: Cigarettes    Start date: 02/08/1974  . Smokeless tobacco: Never Used  . Alcohol use No   Objective:   BP 132/84 (BP Location: Right Arm, Patient Position: Sitting, Cuff Size: Large)   Pulse 84    Temp 97.8 F (36.6 C) (Oral)   Resp 16   Wt 223 lb 6.4 oz (101.3 kg)   BMI 36.06 kg/m   Physical Exam  Constitutional: She is oriented to person, place, and time. She appears well-developed and well-nourished. No distress.  HENT:  Head: Normocephalic and atraumatic.  Right Ear: Hearing normal.  Left Ear: Hearing normal.  Nose: Nose normal.  Eyes: Conjunctivae and lids are normal. Right eye exhibits no discharge. Left eye exhibits no discharge. No scleral icterus.  Pulmonary/Chest: Effort normal. No respiratory distress.  Musculoskeletal: Normal range of motion.  Neurological: She is alert and oriented to person, place, and time.  Skin: Skin is intact. No lesion and no rash noted. There is erythema.  Redness ulnar corner of right thumb nail with pustule. No drainage.  Psychiatric: She has a normal mood and affect. Her speech is normal and behavior is normal. Thought content normal.      Assessment & Plan:     1. Acute paronychia of right thumb Onset over the past week with soreness and swelling with redness around right thumb nail. Will treat with hot epsom saltwater soaks and antibiotic. Recheck prn. - doxycycline (VIBRA-TABS) 100 MG tablet; Take 1 tablet (100 mg total) by mouth 2 (two) times daily.  Dispense: 14 tablet; Refill: 0  2. Need for influenza vaccination - Flu Vaccine QUAD 36+ mos PF IM (Fluarix & Fluzone Quad PF)

## 2015-12-01 ENCOUNTER — Telehealth: Payer: Self-pay | Admitting: Family Medicine

## 2015-12-01 NOTE — Telephone Encounter (Signed)
Called Pt to schedule AWV with [facility] - knb °

## 2015-12-03 ENCOUNTER — Ambulatory Visit (INDEPENDENT_AMBULATORY_CARE_PROVIDER_SITE_OTHER): Payer: Medicare Other | Admitting: *Deleted

## 2015-12-03 DIAGNOSIS — I639 Cerebral infarction, unspecified: Secondary | ICD-10-CM

## 2015-12-03 NOTE — Progress Notes (Signed)
Carelink Summary Report / Loop Recorder 

## 2015-12-06 LAB — CUP PACEART REMOTE DEVICE CHECK: MDC IDC SESS DTM: 20170925123756

## 2015-12-06 NOTE — Progress Notes (Signed)
Carelink summary report received. Battery status OK. Normal device function. No new symptom episodes, tachy episodes, brady, or pause episodes. No new AF episodes. Monthly summary reports and ROV/PRN 

## 2015-12-27 ENCOUNTER — Other Ambulatory Visit: Payer: Self-pay | Admitting: Physician Assistant

## 2015-12-27 ENCOUNTER — Other Ambulatory Visit: Payer: Self-pay | Admitting: Family Medicine

## 2016-01-03 LAB — CUP PACEART REMOTE DEVICE CHECK
Implantable Pulse Generator Implant Date: 20170627
MDC IDC SESS DTM: 20171025123650

## 2016-01-03 NOTE — Progress Notes (Signed)
Carelink summary report received. Battery status OK. Normal device function. No new symptom episodes, tachy episodes, brady, or pause episodes. No new AF episodes. Monthly summary reports and ROV/PRN 

## 2016-01-05 ENCOUNTER — Ambulatory Visit (INDEPENDENT_AMBULATORY_CARE_PROVIDER_SITE_OTHER): Payer: Medicare Other | Admitting: *Deleted

## 2016-01-05 DIAGNOSIS — I639 Cerebral infarction, unspecified: Secondary | ICD-10-CM

## 2016-01-07 NOTE — Progress Notes (Signed)
Carelink Summary Report / Loop Recorder 

## 2016-01-21 ENCOUNTER — Telehealth: Payer: Self-pay | Admitting: Family Medicine

## 2016-01-21 NOTE — Telephone Encounter (Signed)
Called Pt to schedule AWV with [facility] - knb °

## 2016-01-30 ENCOUNTER — Ambulatory Visit (INDEPENDENT_AMBULATORY_CARE_PROVIDER_SITE_OTHER): Payer: Federal, State, Local not specified - PPO | Admitting: Physician Assistant

## 2016-01-30 ENCOUNTER — Ambulatory Visit
Admission: RE | Admit: 2016-01-30 | Discharge: 2016-01-30 | Disposition: A | Discharge status: Home / Self Care | Payer: Medicare Other | Source: Ambulatory Visit | Attending: Physician Assistant | Admitting: Physician Assistant

## 2016-01-30 ENCOUNTER — Telehealth: Payer: Self-pay

## 2016-01-30 ENCOUNTER — Encounter: Payer: Self-pay | Admitting: Physician Assistant

## 2016-01-30 VITALS — BP 122/64 | HR 88 | Temp 98.0°F | Resp 16 | Wt 217.0 lb

## 2016-01-30 DIAGNOSIS — W19XXXA Unspecified fall, initial encounter: Secondary | ICD-10-CM

## 2016-01-30 DIAGNOSIS — M79605 Pain in left leg: Secondary | ICD-10-CM | POA: Diagnosis not present

## 2016-01-30 DIAGNOSIS — M8580 Other specified disorders of bone density and structure, unspecified site: Secondary | ICD-10-CM | POA: Insufficient documentation

## 2016-01-30 DIAGNOSIS — S76011S Strain of muscle, fascia and tendon of right hip, sequela: Secondary | ICD-10-CM | POA: Diagnosis not present

## 2016-01-30 DIAGNOSIS — X58XXXA Exposure to other specified factors, initial encounter: Secondary | ICD-10-CM | POA: Diagnosis not present

## 2016-01-30 DIAGNOSIS — S99912A Unspecified injury of left ankle, initial encounter: Secondary | ICD-10-CM | POA: Diagnosis not present

## 2016-01-30 DIAGNOSIS — M25552 Pain in left hip: Secondary | ICD-10-CM | POA: Diagnosis not present

## 2016-01-30 DIAGNOSIS — S79912A Unspecified injury of left hip, initial encounter: Secondary | ICD-10-CM | POA: Diagnosis not present

## 2016-01-30 DIAGNOSIS — S8992XA Unspecified injury of left lower leg, initial encounter: Secondary | ICD-10-CM | POA: Diagnosis not present

## 2016-01-30 NOTE — Progress Notes (Signed)
Patient: Jocelyn Wells Female    DOB: 05/10/1955   60 y.o.   MRN: [id number] Visit Date: 01/30/2016  Today's Provider: Trinna Post, PA-C   Chief Complaint  Patient presents with  . Fall   Subjective:    Fall  Incident onset: Pt fell yesterday. The fall occurred while walking (Pt reports she tripped over her dogs stairs. ). She landed on carpet. There was no blood loss. The pain is present in the left upper leg. The pain is at a severity of 4/10 (Has been as high as 10/10). The symptoms are aggravated by ambulation, use of injured limb and movement. Pertinent negatives include no headaches. She has tried ice for the symptoms. The treatment provided no relief.   Patient is a 60 y/o female with history of left ankle fracture in 2002 treated with surgery with subsequent removal of hardware, 35 pack year smoking history, as well as osteoarthritis and baseline gait difficulties. She fell over a set of dog stairs yesterday and landed on carpet. She says she fell and her leg experience a twisting motion. She locates her pain as located on her medial left thigh, worsened with walking. Did not hit her head, no LOC. Pt ambulate with difficulty and with use of cane at baseline, as she is doing in the office today. She is reporting she is having trouble lifting her left leg which is new for her.      Allergies  Allergen Reactions  . Codeine Nausea And Vomiting     Current Outpatient Prescriptions:  .  aspirin 325 MG tablet, Take 1 tablet (325 mg total) by mouth daily., Disp: 30 tablet, Rfl: 0 .  atorvastatin (LIPITOR) 10 MG tablet, Take 1 tablet (10 mg total) by mouth daily., Disp: 90 tablet, Rfl: 3 .  BYDUREON 2 MG SRER, , Disp: , Rfl:  .  carvedilol (COREG) 12.5 MG tablet, TAKE 1 TABLET (12.5 MG TOTAL) BY MOUTH 2 (TWO) TIMES DAILY., Disp: 60 tablet, Rfl: 3 .  clonazePAM (KLONOPIN) 0.5 MG tablet, Take one tablet (0.5 mg) by mouth once daily at bedtime as needed, Disp: , Rfl:  .   diclofenac (VOLTAREN) 75 MG EC tablet, Take 1 tablet (75 mg total) by mouth 2 (two) times daily. Take one tablet (75 mg) by mouth twice daily as needed, Disp: 60 tablet, Rfl: 2 .  glyBURIDE-metformin (GLUCOVANCE) 5-500 MG per tablet, Take 2 tablets by mouth 2 (two) times daily. , Disp: , Rfl:  .  loratadine (CLARITIN) 10 MG tablet, Take 1 tablet by mouth daily., Disp: , Rfl:  .  losartan (COZAAR) 50 MG tablet, TAKE 1 TABLET BY MOUTH EVERY DAY, Disp: 30 tablet, Rfl: 6 .  NOVOLIN 70/30 RELION (70-30) 100 UNIT/ML injection, Inject 35 Units into the skin 2 (two) times daily with a meal. , Disp: , Rfl:  .  PARoxetine (PAXIL) 40 MG tablet, Take 1 tablet (40 mg total) by mouth every morning., Disp: 90 tablet, Rfl: 1 .  spironolactone (ALDACTONE) 25 MG tablet, Take 1/2 tablet (12.5 mg) by mouth once daily, Disp: , Rfl:  .  Apremilast (OTEZLA) 30 MG TABS, Take 30 mg by mouth 2 (two) times daily., Disp: , Rfl:   Review of Systems  Constitutional: Negative.   Gastrointestinal: Negative.   Musculoskeletal: Negative.   Neurological: Negative for dizziness, light-headedness and headaches.    Social History  Substance Use Topics  . Smoking status: Current Every Day Smoker  Packs/day: 1.00    Years: 35.00    Types: Cigarettes    Start date: 02/08/1974  . Smokeless tobacco: Never Used  . Alcohol use No   Objective:   BP 122/64 (BP Location: Left Arm, Patient Position: Sitting, Cuff Size: Large)   Pulse 88   Temp 98 F (36.7 C) (Oral)   Resp 16   Wt 217 lb (98.4 kg)   BMI 35.02 kg/m   Physical Exam  Constitutional: She is oriented to person, place, and time. She appears well-developed and well-nourished.  Musculoskeletal: She exhibits edema. She exhibits no tenderness or deformity.  Right lower extremity: some crepitus of the knee and swelling, which pt reports is baseline. 5/5 strength. Sensation grossly in tact.  Left lower extremity: there is a patch of erythema, ecchymosis, and edema  across the inferior anterior portion of the tibia that is tender to palpation. Otherwise, leg appears normal with no open wounds. Some weakness, 4/5 with left hip flexion. Sensation grossly in tact. No tenderness to palpation in medial thigh where patient locates pain. No knee or ankle instability, joint line tenderness.   Neurological: She is alert and oriented to person, place, and time. She displays normal reflexes. She exhibits normal muscle tone. Gait abnormal. Coordination normal.  Patient has unsteady gait, walks with cane in right hand as mobility aid  Skin: Skin is warm and dry. There is erythema.  Psychiatric: She has a normal mood and affect. Her behavior is normal.        Assessment & Plan:     1. Fall, initial encounter  Suspect hip adductor strain. However, with patient's orthopedic surgery history in her ankle and smoking history likely lowering bone density, will assess as below. Pt may use heat/ice as tolerate. Takes diclofenac, so no more NSAIDs on top of that. May take Tylenol if still having pain. Patient asked about muscle relaxer, but I don't feel spasms on exam and worry about muscle relaxer exacerbating pt's baseline balance issues.  - DG HIP UNILAT WITH PELVIS 2-3 VIEWS LEFT; Future - DG Tibia/Fibula Left; Future - DG Ankle Complete Left; Future  Return if symptoms worsen or fail to improve.  Patient Instructions  Groin Strain A groin strain (also called a groin pull) is an injury to the muscles or tendon on the upper inner part of the thigh. These muscles are called the adductor muscles or groin muscles. They are responsible for moving the leg across the body. A muscle strain occurs when a muscle is overstretched and some muscle fibers are torn. A groin strain can range from mild to severe depending on how many muscle fibers are affected and whether the muscle fibers are partially or completely torn.  Groin strains usually occur during exercise or participation in  sports. The injury often happens when a sudden, violent force is placed on a muscle, stretching the muscle too far. A strain is more likely to occur when your muscles are not warmed up or if you are not properly conditioned. Depending on the severity of the groin strain, recovery time may vary from a few weeks to several weeks. Severe injuries often require 4-6 weeks for recovery. In these cases, complete healing can take 4-5 months.  CAUSES   Stretching the groin muscles too far or too suddenly, often during side-to-side motion with an abrupt change in direction.  Putting repeated stress on the groin muscles over a long period of time.  Performing vigorous activity without properly stretching the groin muscles  beforehand. SYMPTOMS   Pain and tenderness in the groin area. This begins as sharp pain and persists as a dull ache.  Popping or snapping feeling when the injury occurs (for severe strains).  Swelling or bruising.  Muscle spasms.  Weakness in the leg.  Stiffness in the groin area with decreased ability to move the affected muscles. DIAGNOSIS  Your caregiver will perform a physical exam to diagnose a groin strain. You will be asked about your symptoms and how the injury occurred. X-rays are sometimes needed to rule out a broken bone or cartilage problems. Your caregiver may order a CT scan or MRI if a complete muscle tear is suspected. TREATMENT  A groin strain will often heal on its own. Your caregiver may prescribe medicines to help manage pain and swelling (anti-inflammatory medicine). You may be told to use crutches for the first few days to minimize your pain. HOME CARE INSTRUCTIONS   Rest. Do not use the strained muscle if it causes pain.  Put ice on the injured area.  Put ice in a plastic bag.  Place a towel between your skin and the bag.  Leave the ice on for 15-20 minutes, every 2-3 hours. Do this for the first 2 days after the injury.  Only take over-the-counter  or prescription medicines as directed by your caregiver.  Wrap the injured area with an elastic bandage as directed by your caregiver.  Keep the injured leg raised (elevated).  Walk, stretch, and perform range-of-motion exercises to improve blood flow to the injured area. Only perform these activities if you can do so without any pain. To prevent muscle strains:  Warm up before exercise.  Develop proper conditioning and strength in the groin muscles. SEEK IMMEDIATE MEDICAL CARE IF:   You have increased pain or swelling in the affected area.   Your symptoms are not improving or are getting worse. MAKE SURE YOU:   Understand these instructions.  Will watch your condition.  Will get help right away if you are not doing well or get worse. This information is not intended to replace advice given to you by your health care provider. Make sure you discuss any questions you have with your health care provider. Document Released: 09/23/2003 Document Revised: 01/12/2012 Document Reviewed: 10/29/2014 Elsevier Interactive Patient Education  2017 [employer].    The entirety of the information documented in the History of Present Illness, Review of Systems and Physical Exam were personally obtained by me. Portions of this information were initially documented by Ashley Royalty, CMA and reviewed by me for thoroughness and accuracy.         Trinna Post, PA-C  [facility]

## 2016-01-30 NOTE — Telephone Encounter (Signed)
Pt advised.   Thanks,   -[place]  

## 2016-01-30 NOTE — Patient Instructions (Signed)
Groin Strain A groin strain (also called a groin pull) is an injury to the muscles or tendon on the upper inner part of the thigh. These muscles are called the adductor muscles or groin muscles. They are responsible for moving the leg across the body. A muscle strain occurs when a muscle is overstretched and some muscle fibers are torn. A groin strain can range from mild to severe depending on how many muscle fibers are affected and whether the muscle fibers are partially or completely torn.  Groin strains usually occur during exercise or participation in sports. The injury often happens when a sudden, violent force is placed on a muscle, stretching the muscle too far. A strain is more likely to occur when your muscles are not warmed up or if you are not properly conditioned. Depending on the severity of the groin strain, recovery time may vary from a few weeks to several weeks. Severe injuries often require 4-6 weeks for recovery. In these cases, complete healing can take 4-5 months.  CAUSES   Stretching the groin muscles too far or too suddenly, often during side-to-side motion with an abrupt change in direction.  Putting repeated stress on the groin muscles over a long period of time.  Performing vigorous activity without properly stretching the groin muscles beforehand. SYMPTOMS   Pain and tenderness in the groin area. This begins as sharp pain and persists as a dull ache.  Popping or snapping feeling when the injury occurs (for severe strains).  Swelling or bruising.  Muscle spasms.  Weakness in the leg.  Stiffness in the groin area with decreased ability to move the affected muscles. DIAGNOSIS  Your caregiver will perform a physical exam to diagnose a groin strain. You will be asked about your symptoms and how the injury occurred. X-rays are sometimes needed to rule out a broken bone or cartilage problems. Your caregiver may order a CT scan or MRI if a complete muscle tear is  suspected. TREATMENT  A groin strain will often heal on its own. Your caregiver may prescribe medicines to help manage pain and swelling (anti-inflammatory medicine). You may be told to use crutches for the first few days to minimize your pain. HOME CARE INSTRUCTIONS   Rest. Do not use the strained muscle if it causes pain.  Put ice on the injured area.  Put ice in a plastic bag.  Place a towel between your skin and the bag.  Leave the ice on for 15-20 minutes, every 2-3 hours. Do this for the first 2 days after the injury.  Only take over-the-counter or prescription medicines as directed by your caregiver.  Wrap the injured area with an elastic bandage as directed by your caregiver.  Keep the injured leg raised (elevated).  Walk, stretch, and perform range-of-motion exercises to improve blood flow to the injured area. Only perform these activities if you can do so without any pain. To prevent muscle strains:  Warm up before exercise.  Develop proper conditioning and strength in the groin muscles. SEEK IMMEDIATE MEDICAL CARE IF:   You have increased pain or swelling in the affected area.   Your symptoms are not improving or are getting worse. MAKE SURE YOU:   Understand these instructions.  Will watch your condition.  Will get help right away if you are not doing well or get worse. This information is not intended to replace advice given to you by your health care provider. Make sure you discuss any questions you have with your  health care provider. Document Released: 09/23/2003 Document Revised: 01/12/2012 Document Reviewed: 10/29/2014 Elsevier Interactive Patient Education  2017 [employer].

## 2016-01-30 NOTE — Telephone Encounter (Signed)
-----   Message from Trinna Post, [place] sent at 01/30/2016  2:58 PM EST ----- All xrays are negative for fractures. May do pain relief and activity as tolerated.

## 2016-02-03 ENCOUNTER — Ambulatory Visit (INDEPENDENT_AMBULATORY_CARE_PROVIDER_SITE_OTHER): Payer: Medicare Other | Admitting: *Deleted

## 2016-02-03 DIAGNOSIS — I639 Cerebral infarction, unspecified: Secondary | ICD-10-CM

## 2016-02-04 NOTE — Progress Notes (Signed)
Carelink Summary Report / Loop Recorder 

## 2016-02-11 ENCOUNTER — Other Ambulatory Visit: Payer: Self-pay | Admitting: Family Medicine

## 2016-02-13 DIAGNOSIS — Z794 Long term (current) use of insulin: Secondary | ICD-10-CM | POA: Diagnosis not present

## 2016-02-13 DIAGNOSIS — E119 Type 2 diabetes mellitus without complications: Secondary | ICD-10-CM | POA: Diagnosis not present

## 2016-02-13 LAB — HEMOGLOBIN A1C
HEMOGLOBIN A1C: 9.1
Hemoglobin A1C: 9.1

## 2016-02-14 LAB — CUP PACEART REMOTE DEVICE CHECK
Date Time Interrogation Session: 20171124130626
Implantable Pulse Generator Implant Date: 20170627

## 2016-02-14 NOTE — Progress Notes (Signed)
Carelink summary report received. Battery status OK. Normal device function. No new symptom episodes, tachy episodes, brady, or pause episodes. No new AF episodes. Monthly summary reports and ROV/PRN 

## 2016-02-16 DIAGNOSIS — E119 Type 2 diabetes mellitus without complications: Secondary | ICD-10-CM | POA: Diagnosis not present

## 2016-02-16 DIAGNOSIS — Z794 Long term (current) use of insulin: Secondary | ICD-10-CM | POA: Diagnosis not present

## 2016-02-19 DIAGNOSIS — N183 Chronic kidney disease, stage 3 (moderate): Secondary | ICD-10-CM | POA: Diagnosis not present

## 2016-02-19 DIAGNOSIS — E1122 Type 2 diabetes mellitus with diabetic chronic kidney disease: Secondary | ICD-10-CM | POA: Diagnosis not present

## 2016-02-19 DIAGNOSIS — E113393 Type 2 diabetes mellitus with moderate nonproliferative diabetic retinopathy without macular edema, bilateral: Secondary | ICD-10-CM | POA: Diagnosis not present

## 2016-02-19 DIAGNOSIS — B9789 Other viral agents as the cause of diseases classified elsewhere: Secondary | ICD-10-CM | POA: Diagnosis not present

## 2016-02-19 DIAGNOSIS — J069 Acute upper respiratory infection, unspecified: Secondary | ICD-10-CM | POA: Diagnosis not present

## 2016-02-19 DIAGNOSIS — F17200 Nicotine dependence, unspecified, uncomplicated: Secondary | ICD-10-CM | POA: Diagnosis not present

## 2016-02-19 DIAGNOSIS — Z794 Long term (current) use of insulin: Secondary | ICD-10-CM | POA: Diagnosis not present

## 2016-02-19 DIAGNOSIS — E1165 Type 2 diabetes mellitus with hyperglycemia: Secondary | ICD-10-CM | POA: Diagnosis not present

## 2016-02-20 NOTE — Progress Notes (Signed)
Acknowledged.

## 2016-02-25 ENCOUNTER — Other Ambulatory Visit: Payer: Self-pay | Admitting: Psychiatry

## 2016-02-27 ENCOUNTER — Ambulatory Visit: Payer: Self-pay

## 2016-02-27 ENCOUNTER — Ambulatory Visit: Payer: Medicare Other | Admitting: Psychiatry

## 2016-03-02 ENCOUNTER — Ambulatory Visit (INDEPENDENT_AMBULATORY_CARE_PROVIDER_SITE_OTHER): Payer: Medicare Other | Admitting: *Deleted

## 2016-03-02 DIAGNOSIS — I639 Cerebral infarction, unspecified: Secondary | ICD-10-CM | POA: Diagnosis not present

## 2016-03-03 NOTE — Progress Notes (Signed)
Carelink Summary Report / Loop Recorder 

## 2016-03-11 ENCOUNTER — Ambulatory Visit (INDEPENDENT_AMBULATORY_CARE_PROVIDER_SITE_OTHER): Payer: Medicare Other

## 2016-03-11 VITALS — BP 129/78 | HR 80 | Temp 97.7°F | Ht 66.0 in | Wt 223.2 lb

## 2016-03-11 DIAGNOSIS — Z114 Encounter for screening for human immunodeficiency virus [HIV]: Secondary | ICD-10-CM

## 2016-03-11 DIAGNOSIS — Z0000 Encounter for general adult medical examination without abnormal findings: Secondary | ICD-10-CM | POA: Diagnosis not present

## 2016-03-11 DIAGNOSIS — Z1211 Encounter for screening for malignant neoplasm of colon: Secondary | ICD-10-CM

## 2016-03-11 DIAGNOSIS — Z1231 Encounter for screening mammogram for malignant neoplasm of breast: Secondary | ICD-10-CM

## 2016-03-11 DIAGNOSIS — Z1239 Encounter for other screening for malignant neoplasm of breast: Secondary | ICD-10-CM

## 2016-03-11 DIAGNOSIS — Z1159 Encounter for screening for other viral diseases: Secondary | ICD-10-CM

## 2016-03-11 NOTE — Patient Instructions (Signed)
Health Maintenance, Female Introduction Adopting a healthy lifestyle and getting preventive care can go a long way to promote health and wellness. Talk with your health care provider about what schedule of regular examinations is right for you. This is a good chance for you to check in with your provider about disease prevention and staying healthy. In between checkups, there are plenty of things you can do on your own. Experts have done a lot of research about which lifestyle changes and preventive measures are most likely to keep you healthy. Ask your health care provider for more information. Weight and diet Eat a healthy diet  Be sure to include plenty of vegetables, fruits, low-fat dairy products, and lean protein.  Do not eat a lot of foods high in solid fats, added sugars, or salt.  Get regular exercise. This is one of the most important things you can do for your health.  Most adults should exercise for at least 150 minutes each week. The exercise should increase your heart rate and make you sweat (moderate-intensity exercise).  Most adults should also do strengthening exercises at least twice a week. This is in addition to the moderate-intensity exercise. Maintain a healthy weight  Body mass index (BMI) is a measurement that can be used to identify possible weight problems. It estimates body fat based on height and weight. Your health care provider can help determine your BMI and help you achieve or maintain a healthy weight.  For females 4 years of age and older:  A BMI below 18.5 is considered underweight.  A BMI of 18.5 to 24.9 is normal.  A BMI of 25 to 29.9 is considered overweight.  A BMI of 30 and above is considered obese. Watch levels of cholesterol and blood lipids  You should start having your blood tested for lipids and cholesterol at 62 years of age, then have this test every 5 years.  You may need to have your cholesterol levels checked more often  if:  Your lipid or cholesterol levels are high.  You are older than 62 years of age.  You are at high risk for heart disease. Cancer screening Lung Cancer  Lung cancer screening is recommended for adults 12-31 years old who are at high risk for lung cancer because of a history of smoking.  A yearly low-dose CT scan of the lungs is recommended for people who:  Currently smoke.  Have quit within the past 15 years.  Have at least a 30-pack-year history of smoking. A pack year is smoking an average of one pack of cigarettes a day for 1 year.  Yearly screening should continue until it has been 15 years since you quit.  Yearly screening should stop if you develop a health problem that would prevent you from having lung cancer treatment. Breast Cancer  Practice breast self-awareness. This means understanding how your breasts normally appear and feel.  It also means doing regular breast self-exams. Let your health care provider know about any changes, no matter how small.  If you are in your 20s or 30s, you should have a clinical breast exam (CBE) by a health care provider every 1-3 years as part of a regular health exam.  If you are 74 or older, have a CBE every year. Also consider having a breast X-ray (mammogram) every year.  If you have a family history of breast cancer, talk to your health care provider about genetic screening.  If you are at high risk for breast cancer,  talk to your health care provider about having an MRI and a mammogram every year.  Breast cancer gene (BRCA) assessment is recommended for women who have family members with BRCA-related cancers. BRCA-related cancers include:  Breast.  Ovarian.  Tubal.  Peritoneal cancers.  Results of the assessment will determine the need for genetic counseling and BRCA1 and BRCA2 testing. Colorectal Cancer  This type of cancer can be detected and often prevented.  Routine colorectal cancer screening usually begins  at 62 years of age and continues through 62 years of age.  Your health care provider may recommend screening at an earlier age if you have risk factors for colon cancer.  Your health care provider may also recommend using home test kits to check for hidden blood in the stool.  A small camera at the end of a tube can be used to examine your colon directly (sigmoidoscopy or colonoscopy). This is done to check for the earliest forms of colorectal cancer.  Routine screening usually begins at age 50.  Direct examination of the colon should be repeated every 5-10 years through 62 years of age. However, you may need to be screened more often if early forms of precancerous polyps or small growths are found. Skin Cancer  Check your skin from head to toe regularly.  Tell your health care provider about any new moles or changes in moles, especially if there is a change in a mole's shape or color.  Also tell your health care provider if you have a mole that is larger than the size of a pencil eraser.  Always use sunscreen. Apply sunscreen liberally and repeatedly throughout the day.  Protect yourself by wearing long sleeves, pants, a wide-brimmed hat, and sunglasses whenever you are outside. Heart disease, diabetes, and high blood pressure  High blood pressure causes heart disease and increases the risk of stroke. High blood pressure is more likely to develop in:  People who have blood pressure in the high end of the normal range (130-139/85-89 mm Hg).  People who are overweight or obese.  People who are African American.  If you are 18-39 years of age, have your blood pressure checked every 3-5 years. If you are 40 years of age or older, have your blood pressure checked every year. You should have your blood pressure measured twice-once when you are at a hospital or clinic, and once when you are not at a hospital or clinic. Record the average of the two measurements. To check your blood pressure  when you are not at a hospital or clinic, you can use:  An automated blood pressure machine at a pharmacy.  A home blood pressure monitor.  If you are between 55 years and 79 years old, ask your health care provider if you should take aspirin to prevent strokes.  Have regular diabetes screenings. This involves taking a blood sample to check your fasting blood sugar level.  If you are at a normal weight and have a low risk for diabetes, have this test once every three years after 62 years of age.  If you are overweight and have a high risk for diabetes, consider being tested at a younger age or more often. Preventing infection Hepatitis B  If you have a higher risk for hepatitis B, you should be screened for this virus. You are considered at high risk for hepatitis B if:  You were born in a country where hepatitis B is common. Ask your health care provider which countries are   considered high risk.  Your parents were born in a high-risk country, and you have not been immunized against hepatitis B (hepatitis B vaccine).  You have HIV or AIDS.  You use needles to inject street drugs.  You live with someone who has hepatitis B.  You have had sex with someone who has hepatitis B.  You get hemodialysis treatment.  You take certain medicines for conditions, including cancer, organ transplantation, and autoimmune conditions. Hepatitis C  Blood testing is recommended for:  Everyone born from 1945 through 1965.  Anyone with known risk factors for hepatitis C. Osteoporosis and menopause  Osteoporosis is a disease in which the bones lose minerals and strength with aging. This can result in serious bone fractures. Your risk for osteoporosis can be identified using a bone density scan.  If you are 65 years of age or older, or if you are at risk for osteoporosis and fractures, ask your health care provider if you should be screened.  Ask your health care provider whether you should take  a calcium or vitamin D supplement to lower your risk for osteoporosis.  Menopause may have certain physical symptoms and risks.  Hormone replacement therapy may reduce some of these symptoms and risks. Talk to your health care provider about whether hormone replacement therapy is right for you. Follow these instructions at home:  Schedule regular health, dental, and eye exams.  Stay current with your immunizations.  Do not use any tobacco products including cigarettes, chewing tobacco, or electronic cigarettes.  If you are pregnant, do not drink alcohol.  If you are breastfeeding, limit how much and how often you drink alcohol.  Limit alcohol intake to no more than 1 drink per day for nonpregnant women. One drink equals 12 ounces of beer, 5 ounces of wine, or 1 ounces of hard liquor.  Do not use street drugs.  Do not share needles.  Ask your health care provider for help if you need support or information about quitting drugs.  Tell your health care provider if you often feel depressed.  Tell your health care provider if you have ever been abused or do not feel safe at home. This information is not intended to replace advice given to you by your health care provider. Make sure you discuss any questions you have with your health care provider. Document Released: 08/10/2010 Document Revised: 07/03/2015 Document Reviewed: 10/29/2014  2017 Elsevier  

## 2016-03-11 NOTE — Progress Notes (Signed)
Subjective:   Jocelyn Wells is a 61 y.o. female who presents for an Initial Medicare Annual Wellness Visit.  Review of Systems    N/A  Cardiac Risk Factors include: advanced age (>20men, >43 women);diabetes mellitus;obesity (BMI >30kg/m2)     Objective:    Today's Vitals   03/11/16 1508 03/11/16 1516  BP: 129/78   Pulse: 80   Temp: 97.7 F (36.5 C)   TempSrc: Oral   Weight: 223 lb 3.2 oz (101.2 kg)   Height: 5\' 6"  (1.676 m)   PainSc: 0-No pain 0-No pain   Body mass index is 36.03 kg/m.   Current Medications (verified) Outpatient Encounter Prescriptions as of 03/11/2016  Medication Sig  . aspirin 325 MG tablet Take 1 tablet (325 mg total) by mouth daily.  Marland Kitchen atorvastatin (LIPITOR) 10 MG tablet Take 1 tablet (10 mg total) by mouth daily.  Marland Kitchen BYDUREON 2 MG SRER   . carvedilol (COREG) 12.5 MG tablet TAKE 1 TABLET (12.5 MG TOTAL) BY MOUTH 2 (TWO) TIMES DAILY.  . clonazePAM (KLONOPIN) 0.5 MG tablet Take one tablet (0.5 mg) by mouth once daily at bedtime as needed  . diclofenac (VOLTAREN) 75 MG EC tablet TAKE 1 TABLET BY MOUTH TWICE A DAY AS NEEDED  . glyBURIDE-metformin (GLUCOVANCE) 5-500 MG per tablet Take 2 tablets by mouth 2 (two) times daily.   Marland Kitchen loratadine (CLARITIN) 10 MG tablet Take 1 tablet by mouth daily.  Marland Kitchen losartan (COZAAR) 50 MG tablet TAKE 1 TABLET BY MOUTH EVERY DAY  . NOVOLIN 70/30 RELION (70-30) 100 UNIT/ML injection Inject 45 Units into the skin 2 (two) times daily with a meal.   . PARoxetine (PAXIL) 40 MG tablet Take 1 tablet (40 mg total) by mouth every morning.  Marland Kitchen spironolactone (ALDACTONE) 25 MG tablet Take 1/2 tablet (12.5 mg) by mouth once daily  . Apremilast (OTEZLA) 30 MG TABS Take 30 mg by mouth 2 (two) times daily.   No facility-administered encounter medications on file as of 03/11/2016.     Allergies (verified) Codeine   History: Past Medical History:  Diagnosis Date  . Anxiety   . Cardiomyopathy [facility])    new to her Jan 2017  . COPD  (chronic obstructive pulmonary disease) ([place])   . Depression   . Diabetes mellitus, type II ([place])   . HTN (hypertension)   . Obesity   . PONV (postoperative nausea and vomiting)   . Stroke [facility])    Jan 2017   Past Surgical History:  Procedure Laterality Date  . ABDOMINAL HYSTERECTOMY    . ANKLE FRACTURE SURGERY Left 2002  . BILATERAL SALPINGOOPHORECTOMY  2000  . CARDIAC CATHETERIZATION N/A 03/25/2015   Procedure: Right/Left Heart Cath and Coronary Angiography;  Surgeon: Belva Crome, MD;  Location: [place] CV LAB;  Service: Cardiovascular;  Laterality: N/A;  . [place]  . EP IMPLANTABLE DEVICE N/A 08/05/2015   Procedure: Loop Recorder Insertion;  Surgeon: Deboraha Sprang, MD;  Location: [place] CV LAB;  Service: Cardiovascular;  Laterality: N/A;  . KNEE ARTHROSCOPY Left 2005  . TEE WITHOUT CARDIOVERSION N/A 01/31/2015   Procedure: TRANSESOPHAGEAL ECHOCARDIOGRAM (TEE);  Surgeon: Lelon Perla, MD;  Location: [place];  Service: Cardiovascular;  Laterality: N/A;  . [place]  . ULNAR NERVE TRANSPOSITION  2008   Family History  Problem Relation Age of Onset  . Heart disease Mother     died from CHF  . Asthma Mother   . Diabetes  Mother   . Heart disease Father   . Aneurysm Father   . Diabetes Brother   . Alcohol abuse Paternal Aunt   . Anemia Neg Hx   . Arrhythmia Neg Hx   . Clotting disorder Neg Hx   . Fainting Neg Hx   . Heart attack Neg Hx   . Heart failure Neg Hx   . Hyperlipidemia Neg Hx   . Hypertension Neg Hx    Social History   Occupational History  . Not on file.   Social History Main Topics  . Smoking status: Current Every Day Smoker    Packs/day: 1.00    Years: 35.00    Types: Cigarettes    Start date: 02/08/1974  . Smokeless tobacco: Never Used  . Alcohol use No  . Drug use: No  . Sexual activity: Yes    Birth control/ protection: None    Tobacco Counseling Ready to quit: Not Answered Counseling given: Not  Answered   Activities of Daily Living In your present state of health, do you have any difficulty performing the following activities: 03/11/2016 03/25/2015  Hearing? Y N  Vision? N N  Difficulty concentrating or making decisions? Y N  Walking or climbing stairs? Y Y  Dressing or bathing? N N  Doing errands, shopping? N -  Preparing Food and eating ? N -  Using the Toilet? N -  In the past six months, have you accidently leaked urine? Y -  Do you have problems with loss of bowel control? - -  Managing your Medications? N -  Managing your Finances? N -  Housekeeping or managing your Housekeeping? N -  Some recent data might be hidden    Immunizations and Health Maintenance Immunization History  Administered Date(s) Administered  . Influenza,inj,Quad PF,36+ Mos 11/07/2015  . Pneumococcal Polysaccharide-23 12/23/2010   There are no preventive care reminders to display for this patient.  Patient Care Team: Margo Common, PA as PCP - General (Physician Assistant) Judi Cong, MD as Physician Assistant (Endocrinology) Deboraha Sprang, MD as Consulting Physician (Cardiology) Rainey Pines, MD as Referring Physician (Psychiatry) Lattie Corns, PA-C as Physician Assistant (Physician Assistant) Ralene Bathe, MD as Consulting Physician (Dermatology)  Indicate any recent Medical Services you may have received from other than [facility] providers in the past year (date may be approximate).     Assessment:   This is a routine wellness examination for [facility].   Hearing/Vision screen Vision Screening Comments: Pt [facility] checks [facility].   Dietary issues and exercise activities discussed: Current Exercise Habits: The patient does not participate in regular exercise at present  Goals    . Increase water intake          Starting 03/11/16, I will increase my water intake to 3 glasses a day.      Depression Screen PHQ 2/9 Scores 03/11/2016 04/24/2015  03/19/2015 02/20/2015  PHQ - 2 Score 1 1 0 0    Fall Risk Fall Risk  03/11/2016 04/24/2015 03/19/2015 02/20/2015  Falls in the past year? Yes Yes Yes Yes  Number falls in past yr: 1 - 2 or more 2 or more  Injury with Fall? No - No No  Risk Factor Category  - - High Fall Risk -  Risk for fall due to : - - Medication side effect;Impaired balance/gait Medication side effect;Impaired balance/gait  Follow up Falls prevention discussed - Education provided;Falls prevention discussed Education provided  Cognitive Function:     6CIT Screen 03/11/2016  What Year? 0 points  What month? 0 points  What time? 0 points  Count back from 20 0 points  Months in reverse 0 points  Repeat phrase 0 points  Total Score 0    Screening Tests Health Maintenance  Topic Date Due  . MAMMOGRAM  07/09/2016 (Originally 02/28/2004)  . COLONOSCOPY  07/09/2016 (Originally 02/28/2004)  . ZOSTAVAX  07/09/2016 (Originally 02/27/2014)  . TETANUS/TDAP  02/08/2017 (Originally 02/27/1973)  . PNEUMOCOCCAL POLYSACCHARIDE VACCINE (2) 02/28/2019 (Originally 12/23/2015)  . PAP SMEAR  02/08/2026 (Originally 02/28/1975)  . HEMOGLOBIN A1C  08/18/2016  . OPHTHALMOLOGY EXAM  11/08/2016  . FOOT EXAM  02/18/2017  . INFLUENZA VACCINE  Completed  . Hepatitis C Screening  Completed  . HIV Screening  Completed      Plan:  I have personally reviewed and addressed the Medicare Annual Wellness questionnaire and have noted the following in the patient's chart:  A. Medical and social history B. Use of alcohol, tobacco or illicit drugs  C. Current medications and supplements D. Functional ability and status E.  Nutritional status F.  Physical activity G. Advance directives H. List of other physicians I.  Hospitalizations, surgeries, and ER visits in previous 12 months J.  [place] such as hearing and vision if needed, cognitive and depression L. Referrals and appointments - none  In addition, I have reviewed and discussed  with patient certain preventive protocols, quality metrics, and best practice recommendations. A written personalized care plan for preventive services as well as general preventive health recommendations were provided to patient.  See attached scanned questionnaire for additional information.   Signed,  Fabio Neighbors, LPN Nurse Health Advisor   MD Recommendations: Follow up on tetanus vaccine- pt declined today.   I have reviewed the health advisor's note, was available for consultation and agree with documentation and plan.

## 2016-03-12 ENCOUNTER — Other Ambulatory Visit: Payer: Self-pay | Admitting: *Deleted

## 2016-03-12 ENCOUNTER — Telehealth: Payer: Self-pay

## 2016-03-12 LAB — HEPATITIS C ANTIBODY: Hep C Virus Ab: <0.1 s/co ratio (ref 0.0–0.9)

## 2016-03-12 LAB — HIV ANTIBODY (ROUTINE TESTING W REFLEX): HIV SCREEN 4TH GENERATION: NONREACTIVE

## 2016-03-12 MED ORDER — CARVEDILOL 12.5 MG PO TABS: 12.5000 mg | ORAL_TABLET | Freq: Two times a day (BID) | ORAL | 3 refills | Status: DC

## 2016-03-12 NOTE — Telephone Encounter (Signed)
Patient advised.

## 2016-03-12 NOTE — Telephone Encounter (Signed)
-----   Message from Margo Common, [place] sent at 03/12/2016  8:16 AM EST ----- HIV and Hep C negative.

## 2016-03-13 ENCOUNTER — Other Ambulatory Visit: Payer: Self-pay | Admitting: Psychiatry

## 2016-03-15 NOTE — Telephone Encounter (Signed)
Pt need appointment

## 2016-03-19 ENCOUNTER — Encounter: Payer: Self-pay | Admitting: Family Medicine

## 2016-03-19 NOTE — Progress Notes (Deleted)
Patient: Jocelyn Wells Female    DOB: Apr 25, 1955   61 y.o.   MRN: [id number] Visit Date: 03/19/2016  Today's Provider: Vernie Murders, PA   No chief complaint on file.  Subjective:    HPI  Patient is here to follow up from [facility] done on 03/11/2016     Diabetes Mellitus Type II, Follow-up:   Lab Results  Component Value Date   HGBA1C 8.4 (H) 01/30/2015   HGBA1C 13.3 (A) 06/22/2013   Last seen for diabetes more than 1 years ago.  Management since then includes continue medication. She reports good compliance with treatment. She is not having side effects.  Current symptoms include none and have been stable. Home blood sugar records: {diabetes glucometry results:16657}  Episodes of hypoglycemia? {yes***/no:17258}   Current Insulin Regimen: Novolin Weight trend: stable Current diet: {diet habits:16563} Current exercise: {exercise types:16438}  ------------------------------------------------------------------------   Hypertension, follow-up:  BP Readings from Last 3 Encounters:  03/11/16 129/78  01/30/16 122/64  11/07/15 132/84    She was last seen for hypertension more than 1 years ago.  Management since that visit includes continue medication.She reports good compliance with treatment. She is not having side effects.  She {is/is not:9024} exercising. She {is/is not:9024} adherent to low salt diet.   Outside blood pressures are ***. She is experiencing none.  Patient denies chest pain, chest pressure/discomfort, irregular heart beat and palpitations.   Cardiovascular risk factors include diabetes mellitus, dyslipidemia, obesity (BMI >= 30 kg/m2) and smoking/ tobacco exposure.  Use of agents associated with hypertension: none.   ------------------------------------------------------------------------    Lipid/Cholesterol, Follow-up:   Last seen for this more than  1 years ago.  Management since that visit includes continue medication.  Last Lipid Panel:   Component Value Date/Time   CHOL 158 01/30/2015 0355   TRIG 182 (H) 01/30/2015 0355   HDL 33 (L) 01/30/2015 0355   CHOLHDL 4.8 01/30/2015 0355   VLDL 36 01/30/2015 0355   LDLCALC 89 01/30/2015 0355    She reports good compliance with treatment. She is not having side effects.   Wt Readings from Last 3 Encounters:  03/11/16 223 lb 3.2 oz (101.2 kg)  01/30/16 217 lb (98.4 kg)  11/07/15 223 lb 6.4 oz (101.3 kg)    ------------------------------------------------------------------------    Previous Medications   APREMILAST (OTEZLA) 30 MG TABS    Take 30 mg by mouth 2 (two) times daily.   ASPIRIN 325 MG TABLET    Take 1 tablet (325 mg total) by mouth daily.   ATORVASTATIN (LIPITOR) 10 MG TABLET    Take 1 tablet (10 mg total) by mouth daily.   BYDUREON 2 MG SRER       CARVEDILOL (COREG) 12.5 MG TABLET    Take 1 tablet (12.5 mg total) by mouth 2 (two) times daily.   CLONAZEPAM (KLONOPIN) 0.5 MG TABLET    Take one tablet (0.5 mg) by mouth once daily at bedtime as needed   DICLOFENAC (VOLTAREN) 75 MG EC TABLET    TAKE 1 TABLET BY MOUTH TWICE A DAY AS NEEDED   GLYBURIDE-METFORMIN (GLUCOVANCE) 5-500 MG PER TABLET    Take 2 tablets by mouth 2 (two) times daily.    LORATADINE (CLARITIN) 10 MG TABLET    Take 1 tablet by mouth daily.   LOSARTAN (COZAAR) 50 MG TABLET    TAKE 1 TABLET BY MOUTH EVERY DAY   NOVOLIN 70/30 RELION (70-30) 100 UNIT/ML INJECTION    Inject 45 Units into the skin  2 (two) times daily with a meal.    PAROXETINE (PAXIL) 40 MG TABLET    Take 1 tablet (40 mg total) by mouth every morning.   SPIRONOLACTONE (ALDACTONE) 25 MG TABLET    Take 1/2 tablet (12.5 mg) by mouth once daily    Review of Systems  Social History  Substance Use Topics  . Smoking status: Current Every Day Smoker    Packs/day: 1.00    Years: 35.00    Types: Cigarettes    Start date: 02/08/1974  . Smokeless tobacco: Never Used  . Alcohol use No   Objective:   There were no vitals taken for this  visit.  Physical Exam      Assessment & Plan:       Follow up: No Follow-up on file.

## 2016-03-20 ENCOUNTER — Other Ambulatory Visit: Payer: Self-pay | Admitting: Psychiatry

## 2016-03-22 LAB — CUP PACEART REMOTE DEVICE CHECK
Implantable Pulse Generator Implant Date: 20170627
MDC IDC SESS DTM: 20171224133525

## 2016-03-23 ENCOUNTER — Other Ambulatory Visit: Payer: Self-pay

## 2016-03-23 ENCOUNTER — Telehealth: Payer: Self-pay | Admitting: Internal Medicine

## 2016-03-23 ENCOUNTER — Ambulatory Visit (INDEPENDENT_AMBULATORY_CARE_PROVIDER_SITE_OTHER): Payer: Medicare Other | Admitting: Family Medicine

## 2016-03-23 ENCOUNTER — Encounter: Payer: Self-pay | Admitting: Family Medicine

## 2016-03-23 VITALS — BP 148/100 | HR 69 | Temp 97.7°F | Resp 16 | Ht 66.0 in | Wt 227.0 lb

## 2016-03-23 DIAGNOSIS — I10 Essential (primary) hypertension: Secondary | ICD-10-CM | POA: Diagnosis not present

## 2016-03-23 DIAGNOSIS — E113319 Type 2 diabetes mellitus with moderate nonproliferative diabetic retinopathy with macular edema, unspecified eye: Secondary | ICD-10-CM | POA: Diagnosis not present

## 2016-03-23 DIAGNOSIS — Z794 Long term (current) use of insulin: Secondary | ICD-10-CM | POA: Diagnosis not present

## 2016-03-23 DIAGNOSIS — I63312 Cerebral infarction due to thrombosis of left middle cerebral artery: Secondary | ICD-10-CM

## 2016-03-23 DIAGNOSIS — E785 Hyperlipidemia, unspecified: Secondary | ICD-10-CM | POA: Diagnosis not present

## 2016-03-23 DIAGNOSIS — F17203 Nicotine dependence unspecified, with withdrawal: Secondary | ICD-10-CM | POA: Diagnosis not present

## 2016-03-23 DIAGNOSIS — I5022 Chronic systolic (congestive) heart failure: Secondary | ICD-10-CM

## 2016-03-23 DIAGNOSIS — I639 Cerebral infarction, unspecified: Secondary | ICD-10-CM

## 2016-03-23 DIAGNOSIS — F331 Major depressive disorder, recurrent, moderate: Secondary | ICD-10-CM

## 2016-03-23 NOTE — Telephone Encounter (Signed)
New message    Pt calling about how to do a home remote pacer check.

## 2016-03-23 NOTE — Progress Notes (Signed)
Patient: Jocelyn Wells Female    DOB: Nov 04, 1955   61 y.o.   MRN: [id number] Visit Date: 03/23/2016  Today's Provider: Vernie Murders, PA   Chief Complaint  Patient presents with  . Hyperlipidemia  . Hypertension  . Diabetes  . Follow-up   Subjective:    HPI  Patient is here to follow up from [facility] done on 03/11/2016.    Diabetes Mellitus Type II, Follow-up:   Lab Results  Component Value Date   HGBA1C 8.4 (H) 01/30/2015   HGBA1C 13.3 (A) 06/22/2013   Last seen for diabetes  More than 1 years ago.  Management since then includes continue medications. She reports good compliance with treatment. She is not having side effects.  Current symptoms include none and have been stable. Home blood sugar records: fasting range: 120-130's  Episodes of hypoglycemia? no   Current Insulin Regimen: Novolin- 45 Units  2 (two) times daily with a meal Weight trend: stable Current diet: in general, a "healthy" diet   Current exercise: none  ------------------------------------------------------------------------   Hypertension, follow-up:  BP Readings from Last 3 Encounters:  03/23/16 (!) 148/100  03/11/16 129/78  01/30/16 122/64    She was last seen for hypertension more than 1 years ago.  Management since that visit includes continue medication .She reports good compliance with treatment. She is not having side effects.  She is not exercising. She is adherent to low salt diet.   Outside blood pressures are not being checked. She is experiencing none.  Patient denies chest pain, chest pressure/discomfort, irregular heart beat and palpitations.   Cardiovascular risk factors include advanced age (older than 70 for men, 74 for women), diabetes mellitus, dyslipidemia, hypertension, obesity (BMI >= 30 kg/m2) and smoking/ tobacco exposure.  Use of agents associated with hypertension: none.    ------------------------------------------------------------------------    Lipid/Cholesterol, Follow-up:   Last seen for this more than 1 years ago.  Management since that visit includes continue medication.  Last Lipid Panel:    Component Value Date/Time   CHOL 158 01/30/2015 0355   TRIG 182 (H) 01/30/2015 0355   HDL 33 (L) 01/30/2015 0355   CHOLHDL 4.8 01/30/2015 0355   VLDL 36 01/30/2015 0355   LDLCALC 89 01/30/2015 0355    She reports good compliance with treatment. She is not having side effects.   Wt Readings from Last 3 Encounters:  03/23/16 227 lb (103 kg)  03/11/16 223 lb 3.2 oz (101.2 kg)  01/30/16 217 lb (98.4 kg)    Past Medical History:  Diagnosis Date  . Anxiety   . Cardiomyopathy [facility])    new to her Jan 2017  . COPD (chronic obstructive pulmonary disease) ([place])   . Depression   . Diabetes mellitus, type II ([place])   . HTN (hypertension)   . Obesity   . PONV (postoperative nausea and vomiting)   . Stroke [facility])    Jan 2017   Past Surgical History:  Procedure Laterality Date  . ABDOMINAL HYSTERECTOMY    . ANKLE FRACTURE SURGERY Left 2002  . BILATERAL SALPINGOOPHORECTOMY  2000  . CARDIAC CATHETERIZATION N/A 03/25/2015   Procedure: Right/Left Heart Cath and Coronary Angiography;  Surgeon: Belva Crome, MD;  Location: [place] CV [facility];  Service: Cardiovascular;  Laterality: N/A;  . [place]  . EP IMPLANTABLE DEVICE N/A 08/05/2015   Procedure: Loop Recorder Insertion;  Surgeon: Deboraha Sprang, MD;  Location: [place] [facility];  Service: Cardiovascular;  Laterality: N/A;  .  KNEE ARTHROSCOPY Left 2005  . TEE WITHOUT CARDIOVERSION N/A 01/31/2015   Procedure: TRANSESOPHAGEAL ECHOCARDIOGRAM (TEE);  Surgeon: Lelon Perla, MD;  Location: [place];  Service: Cardiovascular;  Laterality: N/A;  . [place]  . ULNAR NERVE TRANSPOSITION  2008     ------------------------------------------------------------------------ Cognitive Testing - 6-CIT  Correct? Score   What year is it? yes 0 0 or 4  What month is it? yes 0 0 or 3  Memorize:    [name],  41,  [address],  [place],      What time is it? (within 1 hour) yes 0 0 or 3  Count backwards from 20 yes 0 0, 2, or 4  Name the months of the year yes 0 0, 2, or 4  Repeat name & address above yes 0 0, 2, 4, 6, 8, or 10       TOTAL SCORE  0/28   Interpretation:  Normal  Normal (0-7) Abnormal (8-28)      Previous Medications   ASPIRIN 325 MG TABLET    Take 1 tablet (325 mg total) by mouth daily.   ATORVASTATIN (LIPITOR) 10 MG TABLET    Take 1 tablet (10 mg total) by mouth daily.   B-D INS SYRINGE 0.5CC/31GX5/16 31G X 5/16" 0.5 ML MISC       BYDUREON 2 MG SRER       CARVEDILOL (COREG) 12.5 MG TABLET    Take 1 tablet (12.5 mg total) by mouth 2 (two) times daily.   CLONAZEPAM (KLONOPIN) 0.5 MG TABLET    Take one tablet (0.5 mg) by mouth once daily at bedtime as needed   DICLOFENAC (VOLTAREN) 75 MG EC TABLET    TAKE 1 TABLET BY MOUTH TWICE A DAY AS NEEDED   GLYBURIDE-METFORMIN (GLUCOVANCE) 5-500 MG PER TABLET    Take 2 tablets by mouth 2 (two) times daily.    LORATADINE (CLARITIN) 10 MG TABLET    Take 1 tablet by mouth daily.   LOSARTAN (COZAAR) 50 MG TABLET    TAKE 1 TABLET BY MOUTH EVERY DAY   NOVOLIN 70/30 RELION (70-30) 100 UNIT/ML INJECTION    Inject 45 Units into the skin 2 (two) times daily with a meal.    PAROXETINE (PAXIL) 40 MG TABLET    Take 1 tablet (40 mg total) by mouth every morning.   SPIRONOLACTONE (ALDACTONE) 25 MG TABLET    Take 1/2 tablet (12.5 mg) by mouth once daily    Review of Systems  Constitutional: Positive for fatigue.  HENT: Positive for congestion, postnasal drip, rhinorrhea and sinus pressure.   Respiratory: Positive for cough and shortness of breath.   Cardiovascular: Negative.   Endocrine: Negative.   Musculoskeletal: Positive for  arthralgias and myalgias.  Neurological: Positive for dizziness and numbness.  Hematological: Bruises/bleeds easily.  Psychiatric/Behavioral: Positive for confusion, decreased concentration and sleep disturbance. The patient is nervous/anxious.     Social History  Substance Use Topics  . Smoking status: Current Every Day Smoker    Packs/day: 1.00    Years: 35.00    Types: Cigarettes    Start date: 02/08/1974  . Smokeless tobacco: Never Used  . Alcohol use No   Objective:   BP (!) 148/100 (BP Location: Right Arm, Patient Position: Sitting, Cuff Size: Normal)   Pulse 69   Temp 97.7 F (36.5 C) (Oral)   Resp 16   Ht 5\' 6"  (1.676 m)   Wt 227 lb (103 kg)   SpO2  96%   BMI 36.64 kg/m   Physical Exam  Constitutional: She is oriented to person, place, and time. She appears well-developed and well-nourished.  Obesity.  HENT:  Head: Normocephalic.  Right Ear: External ear normal.  Left Ear: External ear normal.  Nose: Nose normal.  Mouth/Throat: Oropharynx is clear and moist.  Eyes: Conjunctivae and EOM are normal.  "Night blindness" with history of diabetic retinopathy.  Neck: Neck supple. No JVD present.  Cardiovascular: Normal rate, regular rhythm and normal heart sounds.   Pulmonary/Chest: Effort normal and breath sounds normal.  Abdominal: Soft. Bowel sounds are normal.  Genitourinary: Vagina normal. Rectal exam shows guaiac negative stool.  Genitourinary Comments: Hysterectomy in 2000. No masses on bimanual exam. Some vaginal atrophy without discharge.  Musculoskeletal:  Crepitus in left knee. Decrease ROM of spine. Scar over left elbow from ulnar transposition in 2008. Good grip strength and extension.  Lymphadenopathy:    She has no cervical adenopathy.  Neurological: She is alert and oriented to person, place, and time.  Some residual dizziness from cryptogenic stroke when lying down. Resolves in a few seconds. Poor balance. More stable when using cane to ambulate.   Skin: Skin is warm and dry.  Psychiatric: Thought content normal. Her affect is blunt. Her speech is delayed. She is slowed. She exhibits a depressed mood.      Assessment & Plan:     1. Hyperlipidemia, unspecified hyperlipidemia type Tolerating Lipitor 10 mg qd and trying to restrict fats in diet. Needs to lose weight and follow up with cardiologist.  2. Essential hypertension Still taking Spironolactone 25 mg qd and Losartan 50 mg qd. BP elevated today. Will follow up with cardiologist. May need to increase Losartan.  3. Type 2 diabetes mellitus with moderate nonproliferative retinopathy and macular edema, with long-term current use of insulin, unspecified laterality (HCC) Still on Glucovance 5-500 mg 2 tablets BID and Novolin 70/30 45 units BID. Has regular follow up with Dr. Gabriel Carina (endocrinologist) - last exam 02-19-16 with Hgb A1C of 9.1%. Known stage III chronic kidney disease with bilateral retinopathy with macular edema. Continue regular follow up and recheck of labs..  4. Chronic systolic heart failure (HCC) Tolerating Coreg 12.5 mg BID. Has a loop recorder in the right chest to monitor for arrhythmias. Followed by Dr. Caryl Comes (cardiologist).  5. Cerebrovascular accident (CVA) due to thrombosis of left middle cerebral artery (HCC) Poor balance and persistent dizziness when she first lies down after stroke in Dec. 2016. Followed by Dr. Leonie Man (neurologist).  6. Nicotine dependence with withdrawal, unspecified nicotine product type Was counseled during hospitalization regarding need to stop all smoking after cryptogenic stroke Dec. 2016.   7. Depression, major, recurrent, moderate (HCC) Still on Paxil 40 mg qd and followed by Dr. Gretel Acre (psychiatrist). Has been categorized as disabled for years due to depression, poor vision and arthritis.

## 2016-03-23 NOTE — Telephone Encounter (Signed)
Spoke w/ pt and informed her that her remote appt on 04-01-16 will be done from and it is automatic. Pt verbalized understanding.

## 2016-03-26 ENCOUNTER — Encounter: Payer: Self-pay | Admitting: Family Medicine

## 2016-04-01 ENCOUNTER — Ambulatory Visit (INDEPENDENT_AMBULATORY_CARE_PROVIDER_SITE_OTHER): Payer: Medicare Other | Admitting: *Deleted

## 2016-04-01 DIAGNOSIS — I639 Cerebral infarction, unspecified: Secondary | ICD-10-CM | POA: Diagnosis not present

## 2016-04-01 NOTE — Progress Notes (Signed)
Carelink Summary Report / Loop Recorder 

## 2016-04-04 LAB — CUP PACEART REMOTE DEVICE CHECK
Date Time Interrogation Session: 20180123134252
MDC IDC PG IMPLANT DT: 20170627

## 2016-04-05 ENCOUNTER — Ambulatory Visit (INDEPENDENT_AMBULATORY_CARE_PROVIDER_SITE_OTHER): Payer: Medicare Other | Admitting: Psychiatry

## 2016-04-05 ENCOUNTER — Encounter: Payer: Self-pay | Admitting: Psychiatry

## 2016-04-05 VITALS — BP 114/68 | HR 84 | Temp 97.5°F | Wt 225.4 lb

## 2016-04-05 DIAGNOSIS — I639 Cerebral infarction, unspecified: Secondary | ICD-10-CM | POA: Diagnosis not present

## 2016-04-05 DIAGNOSIS — F331 Major depressive disorder, recurrent, moderate: Secondary | ICD-10-CM

## 2016-04-05 MED ORDER — CLONAZEPAM 0.5 MG PO TABS: 0.2500 mg | ORAL_TABLET | Freq: Every day | ORAL | 1 refills | Status: DC

## 2016-04-05 MED ORDER — PAROXETINE HCL 40 MG PO TABS: 40.0000 mg | ORAL_TABLET | ORAL | 1 refills | Status: DC

## 2016-04-05 NOTE — Progress Notes (Signed)
BH MD/PA/NP OP Progress Note  04/05/2016 1:22 PM Jocelyn Wells  MRN:  [id number]  Subjective:   Pt is  a 61 -year-old female who presented for follow-up appointment. She reported that she not been seen since July. Patient reported that she missed her appointments and was able to continue her medications. Patient reported that she continues to have memory issues due to her history of stroke. She is going to have a neurological examination . She has already made an appointment with [facility] neurology. Patient reported that she fell in October and injured her muscles. She did not have any other injuries. Patient currently lives with her husband. Patient reported that she is also planning to have a colonoscopy next month. We discussed about her medications. She reported that she takes Klonopin on a when necessary basis. We discussed about decreasing the dose of Klonopin to decrease her confusion and she agreed with the plan. She is planning to come back for a follow-up appointment after her neurology appointment so we can discuss about her medications. She appeared calm and alert during the interview. She denied having any suicidal homicidal ideations or plans.      Chief Complaint:  Chief Complaint    Follow-up; Medication Refill     Visit Diagnosis:     ICD-9-CM ICD-10-CM   1. MDD (major depressive disorder), recurrent episode, moderate (HCC) 296.32 F33.1     Past Medical History:  Past Medical History:  Diagnosis Date  . Anxiety   . Cardiomyopathy [facility])    new to her Jan 2017  . COPD (chronic obstructive pulmonary disease) ([place])   . Depression   . Diabetes mellitus, type II ([place])   . HTN (hypertension)   . Obesity   . PONV (postoperative nausea and vomiting)   . Stroke [facility])    Jan 2017    Past Surgical History:  Procedure Laterality Date  . ABDOMINAL HYSTERECTOMY    . ANKLE FRACTURE SURGERY Left 2002  . BILATERAL SALPINGOOPHORECTOMY  2000  . CARDIAC CATHETERIZATION N/A  03/25/2015   Procedure: Right/Left Heart Cath and Coronary Angiography;  Surgeon: Belva Crome, MD;  Location: [place] [facility];  Service: Cardiovascular;  Laterality: N/A;  . [place]  . EP IMPLANTABLE DEVICE N/A 08/05/2015   Procedure: Loop Recorder Insertion;  Surgeon: Deboraha Sprang, MD;  Location: [place] [facility];  Service: Cardiovascular;  Laterality: N/A;  . KNEE ARTHROSCOPY Left 2005  . TEE WITHOUT CARDIOVERSION N/A 01/31/2015   Procedure: TRANSESOPHAGEAL ECHOCARDIOGRAM (TEE);  Surgeon: Lelon Perla, MD;  Location: [place];  Service: Cardiovascular;  Laterality: N/A;  . [place]  . ULNAR NERVE TRANSPOSITION  2008   Family History:  Family History  Problem Relation Age of Onset  . Heart disease Mother     died from CHF  . Asthma Mother   . Diabetes Mother   . Heart disease Father   . Aneurysm Father   . Diabetes Brother   . Alcohol abuse Paternal Aunt   . Anemia Neg Hx   . Arrhythmia Neg Hx   . Clotting disorder Neg Hx   . Fainting Neg Hx   . Heart attack Neg Hx   . Heart failure Neg Hx   . Hyperlipidemia Neg Hx   . Hypertension Neg Hx    Social History:  Social History   Social History  . Marital status: Married    Spouse name: N/A  . Number of children: N/A  .  Years of education: N/A   Social History Main Topics  . Smoking status: Current Every Day Smoker    Packs/day: 1.00    Years: 35.00    Types: Cigarettes    Start date: 02/08/1974  . Smokeless tobacco: Never Used  . Alcohol use No  . Drug use: No  . Sexual activity: Yes    Birth control/ protection: None   Other Topics Concern  . None   Social History Narrative   Lives at home with stepson and husband, dogs and cats   Caffeine  Drinks sweet tea.   Right handed.    Additional History:  Married x 18 years. Has 2 children and Her husband has 2 children as well Assessment:   Musculoskeletal: Strength & Muscle Tone: within normal limits Gait & Station:  normal Patient leans: N/A  Psychiatric Specialty Exam: Medication Refill  Associated symptoms include coughing, nausea and vomiting. Pertinent negatives include no chills, headaches or rash.    Review of Systems  Constitutional: Negative for chills and weight loss.  HENT: Negative for nosebleeds and tinnitus.   Eyes: Negative for double vision and discharge.  Respiratory: Positive for cough.   Cardiovascular: Negative for orthopnea.  Gastrointestinal: Positive for nausea and vomiting. Negative for blood in stool and diarrhea.  Genitourinary: Negative for urgency.  Skin: Negative for rash.  Neurological: Positive for sensory change, speech change and focal weakness. Negative for tingling and headaches.  Endo/Heme/Allergies: Negative for environmental allergies.  Psychiatric/Behavioral: Positive for depression.  All other systems reviewed and are negative.   Blood pressure 114/68, pulse 84, temperature 97.5 F (36.4 C), temperature source Oral, weight 225 lb 6.4 oz (102.2 kg).Body mass index is 36.38 kg/m.  General Appearance: Casual  Eye Contact:  Fair  Speech:  Slow  Volume:  Normal  Mood:  Anxious  Affect:  Congruent  Thought Process:  Coherent  Orientation:  Full (Time, Place, and Person)  Thought Content:  WDL  Suicidal Thoughts:  No  Homicidal Thoughts:  No  Memory:  Immediate;   Fair  Judgement:  Fair  Insight:  Fair  Psychomotor Activity:  Normal  Concentration:  Fair  Recall:  [employer] of Knowledge: Fair  Language: Fair  Akathisia:  No  Handed:  Right  AIMS (if indicated):  none  Assets:  Communication Skills Desire for Improvement Physical Health Social Support  ADL's:  Intact  Cognition: WNL  Sleep:  6-7   Is the patient at risk to self?  No. Has the patient been a risk to self in the past 6 months?  No. Has the patient been a risk to self within the distant past?  No. Is the patient a risk to others?  No. Has the patient been a risk to others in  the past 6 months?  No. Has the patient been a risk to others within the distant past?  No.  Current Medications: Current Outpatient Prescriptions  Medication Sig Dispense Refill  . aspirin 325 MG tablet Take 1 tablet (325 mg total) by mouth daily. 30 tablet 0  . atorvastatin (LIPITOR) 10 MG tablet Take 1 tablet (10 mg total) by mouth daily. 90 tablet 3  . B-D INS SYRINGE 0.5CC/31GX5/16 31G X 5/16" 0.5 ML MISC     . BYDUREON 2 MG SRER     . carvedilol (COREG) 12.5 MG tablet Take 1 tablet (12.5 mg total) by mouth 2 (two) times daily. 180 tablet 3  . clonazePAM (KLONOPIN) 0.5 MG tablet Take 0.5  tablets (0.25 mg total) by mouth at bedtime. Take one tablet (0.5 mg) by mouth once daily at bedtime as needed 15 tablet 1  . diclofenac (VOLTAREN) 75 MG EC tablet TAKE 1 TABLET BY MOUTH TWICE A DAY AS NEEDED 60 tablet 2  . glyBURIDE-metformin (GLUCOVANCE) 5-500 MG per tablet Take 2 tablets by mouth 2 (two) times daily.     Marland Kitchen loratadine (CLARITIN) 10 MG tablet Take 1 tablet by mouth daily.    Marland Kitchen losartan (COZAAR) 50 MG tablet TAKE 1 TABLET BY MOUTH EVERY DAY 30 tablet 6  . NOVOLIN 70/30 RELION (70-30) 100 UNIT/ML injection Inject 45 Units into the skin 2 (two) times daily with a meal.     . PARoxetine (PAXIL) 40 MG tablet Take 1 tablet (40 mg total) by mouth every morning. 90 tablet 1  . spironolactone (ALDACTONE) 25 MG tablet Take 1/2 tablet (12.5 mg) by mouth once daily     No current facility-administered medications for this visit.     Medical Decision Making:  Established Problem, Stable/Improving (1), Review of Psycho-Social Stressors (1) and Review of Last Therapy Session (1)  Treatment Plan Summary:Medication management   Depression and anxiety I will start her on Paxil 40 mg by mouth daily at bedtime to help with the depression.  She will continue on Klonopin when necessary basis  Follow-up Follow-up in 1 months or earlier depending on her symptoms   More than 50% of the time spent in  psychoeducation, counseling and coordination of care.     This note was generated in part or whole with voice recognition software. Voice regonition is usually quite accurate but there are transcription errors that can and very often do occur. I apologize for any typographical errors that were not detected and corrected.   Rainey Pines, MD

## 2016-04-08 ENCOUNTER — Other Ambulatory Visit: Payer: Self-pay | Admitting: Internal Medicine

## 2016-04-08 NOTE — Telephone Encounter (Signed)
Ok to fill spironolactone 25 mg- take 1/2 tablet (12.5 mg) by mouth once daily daily- this was started by Dr. Caryl Comes in March- I am not sure where the BID dose came from.   She is currently PRN follow up, but since we have seen her in the last year, ok to refill through July 2018.

## 2016-04-08 NOTE — Telephone Encounter (Signed)
Please review refill request. Med list states 25mg  BID. Pt states she takes 1/2tab every morning per Dr.Klein's instructions. Last OV shows med has been D/C. Please advise. Also, please let me know when pt needs to f/u. Thanks!

## 2016-04-21 LAB — CUP PACEART REMOTE DEVICE CHECK
Date Time Interrogation Session: 20180222133806
Implantable Pulse Generator Implant Date: 20170627

## 2016-04-27 ENCOUNTER — Telehealth: Payer: Self-pay

## 2016-04-27 NOTE — Telephone Encounter (Signed)
I spoke to patient and moved her appt to a different time due to provider is in a meeting.

## 2016-04-28 ENCOUNTER — Ambulatory Visit: Payer: Self-pay | Admitting: Adult Health

## 2016-04-28 ENCOUNTER — Ambulatory Visit: Payer: Medicare Other | Admitting: Adult Health

## 2016-04-30 DIAGNOSIS — R1310 Dysphagia, unspecified: Secondary | ICD-10-CM | POA: Diagnosis not present

## 2016-04-30 DIAGNOSIS — Z86010 Personal history of colonic polyps: Secondary | ICD-10-CM | POA: Diagnosis not present

## 2016-05-03 ENCOUNTER — Ambulatory Visit (INDEPENDENT_AMBULATORY_CARE_PROVIDER_SITE_OTHER): Payer: Medicare Other | Admitting: *Deleted

## 2016-05-03 DIAGNOSIS — I639 Cerebral infarction, unspecified: Secondary | ICD-10-CM | POA: Diagnosis not present

## 2016-05-04 NOTE — Progress Notes (Signed)
Carelink Summary Report / Loop Recorder 

## 2016-05-05 ENCOUNTER — Ambulatory Visit: Payer: Medicare Other | Admitting: Psychiatry

## 2016-05-05 DIAGNOSIS — E113393 Type 2 diabetes mellitus with moderate nonproliferative diabetic retinopathy without macular edema, bilateral: Secondary | ICD-10-CM | POA: Diagnosis not present

## 2016-05-09 ENCOUNTER — Other Ambulatory Visit: Payer: Self-pay | Admitting: Family Medicine

## 2016-05-12 LAB — CUP PACEART REMOTE DEVICE CHECK
Date Time Interrogation Session: 20180324140626
MDC IDC PG IMPLANT DT: 20170627

## 2016-05-19 DIAGNOSIS — H40053 Ocular hypertension, bilateral: Secondary | ICD-10-CM | POA: Diagnosis not present

## 2016-05-19 DIAGNOSIS — Z794 Long term (current) use of insulin: Secondary | ICD-10-CM | POA: Diagnosis not present

## 2016-05-19 DIAGNOSIS — E1165 Type 2 diabetes mellitus with hyperglycemia: Secondary | ICD-10-CM | POA: Diagnosis not present

## 2016-05-19 DIAGNOSIS — E113393 Type 2 diabetes mellitus with moderate nonproliferative diabetic retinopathy without macular edema, bilateral: Secondary | ICD-10-CM | POA: Diagnosis not present

## 2016-05-19 DIAGNOSIS — E1122 Type 2 diabetes mellitus with diabetic chronic kidney disease: Secondary | ICD-10-CM | POA: Diagnosis not present

## 2016-05-19 DIAGNOSIS — N183 Chronic kidney disease, stage 3 (moderate): Secondary | ICD-10-CM | POA: Diagnosis not present

## 2016-05-19 LAB — MICROALBUMIN, URINE: Microalb, Ur: 34

## 2016-05-19 LAB — HEMOGLOBIN A1C: Hemoglobin A1C: 12.3

## 2016-05-26 DIAGNOSIS — N183 Chronic kidney disease, stage 3 (moderate): Secondary | ICD-10-CM | POA: Diagnosis not present

## 2016-05-26 DIAGNOSIS — E1165 Type 2 diabetes mellitus with hyperglycemia: Secondary | ICD-10-CM | POA: Diagnosis not present

## 2016-05-26 DIAGNOSIS — Z9114 Patient's other noncompliance with medication regimen: Secondary | ICD-10-CM | POA: Diagnosis not present

## 2016-05-26 DIAGNOSIS — E669 Obesity, unspecified: Secondary | ICD-10-CM | POA: Diagnosis not present

## 2016-05-26 DIAGNOSIS — Z794 Long term (current) use of insulin: Secondary | ICD-10-CM | POA: Diagnosis not present

## 2016-05-26 DIAGNOSIS — F17200 Nicotine dependence, unspecified, uncomplicated: Secondary | ICD-10-CM | POA: Diagnosis not present

## 2016-05-26 DIAGNOSIS — E1122 Type 2 diabetes mellitus with diabetic chronic kidney disease: Secondary | ICD-10-CM | POA: Diagnosis not present

## 2016-05-26 DIAGNOSIS — E113393 Type 2 diabetes mellitus with moderate nonproliferative diabetic retinopathy without macular edema, bilateral: Secondary | ICD-10-CM | POA: Diagnosis not present

## 2016-05-27 ENCOUNTER — Other Ambulatory Visit: Payer: Self-pay | Admitting: Family Medicine

## 2016-05-31 ENCOUNTER — Ambulatory Visit (INDEPENDENT_AMBULATORY_CARE_PROVIDER_SITE_OTHER): Payer: Medicare Other | Admitting: *Deleted

## 2016-05-31 DIAGNOSIS — I639 Cerebral infarction, unspecified: Secondary | ICD-10-CM | POA: Diagnosis not present

## 2016-06-01 NOTE — Progress Notes (Signed)
Carelink Summary Report / Loop Recorder 

## 2016-06-15 ENCOUNTER — Telehealth: Payer: Self-pay

## 2016-06-15 LAB — CUP PACEART REMOTE DEVICE CHECK
Implantable Pulse Generator Implant Date: 20170627
MDC IDC SESS DTM: 20180423150133

## 2016-06-15 NOTE — Telephone Encounter (Signed)
I called pt, informed her that Jinny Blossom, NP will be out of the office on 06/17/16 and her appt for that day will need to be r/s. Pt is agreeable to coming in on 06/16/2016 at 1:00pm. Pt verbalized understanding of new appt, date and time.

## 2016-06-16 ENCOUNTER — Encounter: Payer: Self-pay | Admitting: Adult Health

## 2016-06-16 ENCOUNTER — Ambulatory Visit (INDEPENDENT_AMBULATORY_CARE_PROVIDER_SITE_OTHER): Payer: Medicare Other | Admitting: Adult Health

## 2016-06-16 VITALS — BP 118/73 | HR 77 | Wt 230.8 lb

## 2016-06-16 DIAGNOSIS — Z8673 Personal history of transient ischemic attack (TIA), and cerebral infarction without residual deficits: Secondary | ICD-10-CM

## 2016-06-16 DIAGNOSIS — R42 Dizziness and giddiness: Secondary | ICD-10-CM | POA: Diagnosis not present

## 2016-06-16 DIAGNOSIS — I639 Cerebral infarction, unspecified: Secondary | ICD-10-CM | POA: Diagnosis not present

## 2016-06-16 NOTE — Progress Notes (Signed)
PATIENT: Jocelyn Wells DOB: 1955/07/15  REASON FOR VISIT: follow up- history of CVA HISTORY FROM: patient  HISTORY OF PRESENT ILLNESS: Jocelyn Wells is a 61 year old female with a history of stroke. She returns today for follow-up. She remains on aspirin and is tolerating it well. She denies any additional stroke like symptoms. Her primary care is managing her blood pressure and cholesterol. She sees an endocrinologist for her diabetes. She reports that her hemoglobin A1c has remained high at approximately 10. She states that she is trying to monitor her diet. She continues to notice memory deficits since her stroke however she does not feel that it's gotten worse. She is able to complete all ADLs independently. She  Operates a Teacher, music without difficulty. She is able to prepare her own meals. Her husband now handles the finances since she had her stroke. She does state that she's been having episodes of dizziness. She states this been going on for several months. She is unsure if it occurs with positional changes. Initially she said it felt as if the room was spinning but then described as an off-balance sensation. She states that during these episodes she has checked her blood sugar and sometimes it has been really low or really high. She has not followed up with her primary care for the dizziness. She returns today for an evaluation.   HISTORY Jocelyn Wells is a 61 year old female with a history of stroke. She returns today for follow-up. She is currently on aspirin and tolerating it well. Today her blood pressure is in normal range. She states that she has not had a recent follow-up with her primary care. She does have an endocrinologist who she saw in August. Her last hemoglobin A1c was 10.3. She states that since her stroke she has not had any residual symptoms. She has also not developed any new neurological symptoms. She does report being forgetful at times but she feels this is related to  her age. She lives at home with her spouse and stepson. She is able to complete all ADLs independently. She prepared meals without difficulty. She states that she does not sleep well at night. She does have a psychiatrist who is managing her depression. She returns today for an evaluation.  HISTORY 04/23/15: 34 year Caucasian lady seen today for first office follow-up visit following hospital admission for stroke in December 2016. Jocelyn Wells is a 61 y.o. female with a history of DM, HTN who presented with sudden onset right hand weakness, right sided numbness and mild word finding difficulty that started at 2pm 01/29/2015 ([facility]). She states that she noticed that she was slurring her speech. She was brought into the emergency room where a code stroke was activated. Extensive discussion was had with the patient and her son. With an NIH of 3, it is not typical to offer TPA and therefore I discussed this with the son and patient who agreed holding off on TPA at this time. Patient was not administered TPA secondary to mild symptoms. She was admitted for further evaluation and treatment. MRI scan of the brain showed a patchy small left MCA branch infarct involving posterior left frontal lobe and insular cortex. CT angiogram of the brain showed a high-grade stenosis of the left M3 branch of the middle cerebral artery corresponding to the patient's symptoms. Carotid ultrasound was unremarkable. Transthoracic echo showed diminished ejection fraction of 30-35% without definite clot noted. Transesophageal echocardiogram showed no definite clot but showed severe global reduction  in the left ventricular function. Patient underwent outpatient cardiac catheterization on 03/25/15 which confirmed low ejection fraction but did not show significant coronary artery stenosis. Hemoglobin A1c was elevated at 8.4. LDL cholesterol was 89 mg percent. Patient for 30 day outpatient cardiac monitor but the results of it are yet elevated.  She was seen by Dr. Caryl Comes for consideration for loop recorder insertion but he plans to wait 2 months and repeat her echocardiogram. Patient also states that she snores a lot and tires easily and has excessive daytime sleepiness and polysomnogram for detection of sleep apnea is being considered but not yet ordered. She states she's done well speech has recovered quite well. She still has occasional dizzy spells and feels her mental processing skills are slightly diminished. She has no other complaints.   REVIEW OF SYSTEMS: Out of a complete 14 system review of symptoms, the patient complains only of the following symptoms, and all other reviewed systems are negative.  Cough, shortness of breath, ringing in ears, runny nose, drooling, daytime sleepiness, walking difficulty, joint pain, confusion, weakness, dizziness, bruise easily  ALLERGIES: Allergies  Allergen Reactions  . Codeine Nausea And Vomiting    HOME MEDICATIONS: Outpatient Medications Prior to Visit  Medication Sig Dispense Refill  . aspirin 325 MG tablet Take 1 tablet (325 mg total) by mouth daily. 30 tablet 0  . atorvastatin (LIPITOR) 10 MG tablet TAKE 1 TABLET (10 MG TOTAL) BY MOUTH DAILY. 90 tablet 3  . B-D INS SYRINGE 0.5CC/31GX5/16 31G X 5/16" 0.5 ML MISC     . BYDUREON 2 MG SRER     . carvedilol (COREG) 12.5 MG tablet Take 1 tablet (12.5 mg total) by mouth 2 (two) times daily. 180 tablet 3  . clonazePAM (KLONOPIN) 0.5 MG tablet Take 0.5 tablets (0.25 mg total) by mouth at bedtime. Take one tablet (0.5 mg) by mouth once daily at bedtime as needed 15 tablet 1  . diclofenac (VOLTAREN) 75 MG EC tablet TAKE 1 TABLET BY MOUTH TWICE A DAY AS NEEDED 60 tablet 2  . glyBURIDE-metformin (GLUCOVANCE) 5-500 MG per tablet Take 2 tablets by mouth 2 (two) times daily.     Marland Kitchen loratadine (CLARITIN) 10 MG tablet Take 1 tablet by mouth daily.    Marland Kitchen losartan (COZAAR) 50 MG tablet TAKE 1 TABLET BY MOUTH EVERY DAY 30 tablet 6  . NOVOLIN 70/30  RELION (70-30) 100 UNIT/ML injection Inject 45 Units into the skin 2 (two) times daily with a meal.     . PARoxetine (PAXIL) 40 MG tablet Take 1 tablet (40 mg total) by mouth every morning. 90 tablet 1  . spironolactone (ALDACTONE) 25 MG tablet Take 1/2 tablet (12.5 mg) by mouth once daily    . spironolactone (ALDACTONE) 25 MG tablet TAKE 1/2 OF A TABLET BY MOUTH EVERY DAY 45 tablet 0   No facility-administered medications prior to visit.     PAST MEDICAL HISTORY: Past Medical History:  Diagnosis Date  . Anxiety   . Cardiomyopathy [facility])    new to her Jan 2017  . COPD (chronic obstructive pulmonary disease) ([place])   . Depression   . Diabetes mellitus, type II ([place])   . HTN (hypertension)   . Obesity   . PONV (postoperative nausea and vomiting)   . Stroke [facility])    Jan 2017    PAST SURGICAL HISTORY: Past Surgical History:  Procedure Laterality Date  . ABDOMINAL HYSTERECTOMY    . ANKLE FRACTURE SURGERY Left 2002  . BILATERAL SALPINGOOPHORECTOMY  2000  . CARDIAC CATHETERIZATION N/A 03/25/2015   Procedure: Right/Left Heart Cath and Coronary Angiography;  Surgeon: Belva Crome, MD;  Location: [facility];  Service: Cardiovascular;  Laterality: N/A;  . [place]  . EP IMPLANTABLE DEVICE N/A 08/05/2015   Procedure: Loop Recorder Insertion;  Surgeon: Deboraha Sprang, MD;  Location: [place] [facility];  Service: Cardiovascular;  Laterality: N/A;  . KNEE ARTHROSCOPY Left 2005  . TEE WITHOUT CARDIOVERSION N/A 01/31/2015   Procedure: TRANSESOPHAGEAL ECHOCARDIOGRAM (TEE);  Surgeon: Lelon Perla, MD;  Location: [facility];  Service: Cardiovascular;  Laterality: N/A;  . [place]  . ULNAR NERVE TRANSPOSITION  2008    FAMILY HISTORY: Family History  Problem Relation Age of Onset  . Heart disease Mother     died from CHF  . Asthma Mother   . Diabetes Mother   . Heart disease Father   . Aneurysm Father   . Diabetes Brother   . Alcohol abuse Paternal  Aunt   . Anemia Neg Hx   . Arrhythmia Neg Hx   . Clotting disorder Neg Hx   . Fainting Neg Hx   . Heart attack Neg Hx   . Heart failure Neg Hx   . Hyperlipidemia Neg Hx   . Hypertension Neg Hx     SOCIAL HISTORY: Social History   Social History  . Marital status: Married    Spouse name: N/A  . Number of children: N/A  . Years of education: N/A   Occupational History  . Not on file.   Social History Main Topics  . Smoking status: Current Every Day Smoker    Packs/day: 1.00    Years: 35.00    Types: Cigarettes    Start date: 02/08/1974  . Smokeless tobacco: Never Used  . Alcohol use No  . Drug use: No  . Sexual activity: Yes    Birth control/ protection: None   Other Topics Concern  . Not on file   Social History Narrative   Lives at home with stepson and husband, dogs and cats   Caffeine  Drinks sweet tea.   Right handed.       PHYSICAL EXAM  Vitals:   06/16/16 1248  BP: 118/73  Pulse: 77  Weight: 230 lb 12.8 oz (104.7 kg)   Body mass index is 37.25 kg/m.  Generalized: Well developed, in no acute distress   Neurological examination  Mentation: Alert oriented to time, place, history taking. Follows all commands speech and language fluent Cranial nerve II-XII: Pupils were equal round reactive to light. Extraocular movements were full, visual field were full on confrontational test. Facial sensation and strength were normal. Uvula tongue midline. Head turning and shoulder shrug  were normal and symmetric. Motor: The motor testing reveals 5 over 5 strength of all 4 extremities. Good symmetric motor tone is noted throughout.  Sensory: Sensory testing is intact to soft touch on all 4 extremities. No evidence of extinction is noted.  Coordination: Cerebellar testing reveals good finger-nose-finger and heel-to-shin bilaterally.  Gait and station: Gait is normal. Tandem gait is unsteady. Romberg is negative. No drift is seen.  Reflexes: Deep tendon reflexes are  symmetric and normal bilaterally.   DIAGNOSTIC DATA (LABS, IMAGING, TESTING) - I reviewed patient records, labs, notes, testing and imaging myself where available.  Lab Results  Component Value Date   WBC 12.1 (H) 06/02/2015   HGB 12.2 06/02/2015   HCT 36.4 06/02/2015  MCV 84.8 06/02/2015   PLT 172 06/02/2015      Component Value Date/Time   NA 137 06/02/2015 0450   NA 142 02/11/2015 1247   K 4.7 06/02/2015 0450   CL 104 06/02/2015 0450   CO2 23 06/02/2015 0450   GLUCOSE 273 (H) 06/02/2015 0450   BUN 38 (H) 06/02/2015 0450   BUN 24 02/11/2015 1247   CREATININE 1.68 (H) 06/02/2015 0450   CREATININE 1.37 (H) 03/21/2015 1451   CALCIUM 8.9 06/02/2015 0450   PROT 6.4 02/11/2015 1247   ALBUMIN 4.1 02/11/2015 1247   AST 15 02/11/2015 1247   ALT 20 02/11/2015 1247   ALKPHOS 89 02/11/2015 1247   BILITOT <0.2 02/11/2015 1247   GFRNONAA 32 (L) 06/02/2015 0450   GFRAA 37 (L) 06/02/2015 0450   Lab Results  Component Value Date   CHOL 158 01/30/2015   HDL 33 (L) 01/30/2015   LDLCALC 89 01/30/2015   TRIG 182 (H) 01/30/2015   CHOLHDL 4.8 01/30/2015   Lab Results  Component Value Date   HGBA1C 9.1 02/13/2016      ASSESSMENT AND PLAN 61 y.o. year old female  has a past medical history of Anxiety; Cardiomyopathy ([place]); COPD (chronic obstructive pulmonary disease) ([place]); Depression; Diabetes mellitus, type II ([place]); HTN (hypertension); Obesity; PONV (postoperative nausea and vomiting); and Stroke ([place]). here with :  1. History of stroke 2. Dizziness  The patient has remained stable since her stroke in 2016. She should remain on aspirin for stroke prevention. She should maintain strict control of her blood pressure goal less than 130/90, cholesterol LDL less than 70 and hemoglobin A1c less than 6.5%. The patient dizziness may be related to her blood sugar. I advised the patient that she should check her blood sugar during every episode. If she notices a correlation she should  follow-up with her endocrinologist. She is advised that if the dizziness persists she should see her primary care provider for a workup. Patient is amenable to this plan. Patient has been stable since her stroke in 2016. For that reason she will follow-up with our office on an as-needed basis.    Ward Givens, MSN, NP-C 06/16/2016, 1:02 PM [facility] [address] [place], [place] [address] [phone]

## 2016-06-16 NOTE — Patient Instructions (Signed)
Continue Aspirin  BP goal <130/90 Cholesterol LDL <70 HgbA1c <70 Monitor Blood sugar during dizzy episodes to see if it correlates. If dizziness continues then follow-up with PCP.  If you develop stroke like symptoms please go to the ED

## 2016-06-17 ENCOUNTER — Ambulatory Visit: Payer: Medicare Other | Admitting: Adult Health

## 2016-06-21 NOTE — Progress Notes (Signed)
I agree with the above plan 

## 2016-06-22 ENCOUNTER — Telehealth: Payer: Self-pay | Admitting: Internal Medicine

## 2016-06-22 NOTE — Telephone Encounter (Signed)
Patient states that she is going on a cruise for 10 days. She wanted to know if she should take her [employer] monitor with her. I told patient that she could take her monitor with her, but that she may not have cell signal while on the cruise. I assured her that if she had any episodes they would be stored on her ILR and be sent to [place] once she comes into contact with her monitor. Patient verbalized understanding and states that she will probably leave it home. Noted in [place].

## 2016-06-22 NOTE — Telephone Encounter (Signed)
Pt is going out of the country 07/03/16 until 07/16/17  She has a device in her and does her remotes check  She is wanting to know if she is to take the device reader with her or not Please advise.

## 2016-06-23 ENCOUNTER — Encounter: Payer: Self-pay | Admitting: Psychiatry

## 2016-06-23 ENCOUNTER — Ambulatory Visit (INDEPENDENT_AMBULATORY_CARE_PROVIDER_SITE_OTHER): Payer: Medicare Other | Admitting: Psychiatry

## 2016-06-23 VITALS — BP 137/82 | HR 79 | Temp 97.6°F | Wt 227.6 lb

## 2016-06-23 DIAGNOSIS — F331 Major depressive disorder, recurrent, moderate: Secondary | ICD-10-CM

## 2016-06-23 DIAGNOSIS — I639 Cerebral infarction, unspecified: Secondary | ICD-10-CM

## 2016-06-23 MED ORDER — PAROXETINE HCL 40 MG PO TABS: 40.0000 mg | ORAL_TABLET | Freq: Every day | ORAL | 1 refills | Status: DC

## 2016-06-23 MED ORDER — CLONAZEPAM 0.5 MG PO TABS: 0.2500 mg | ORAL_TABLET | Freq: Every day | ORAL | 1 refills | Status: DC

## 2016-06-23 NOTE — Progress Notes (Signed)
BH MD/PA/NP OP Progress Note  06/23/2016 9:22 AM Jocelyn Wells  MRN:  [id number]  Subjective:   Pt is  a 61 -year-old female who presented for follow-up appointment. She reported that she not been seen since July. Patient reported that she missed her appointments and was able to continue her medications. Patient reported that she continues to have memory issues due to her history of stroke. She is going to have a neurological examination . She has already made an appointment with [facility] neurology. Patient reported that she fell in October and injured her muscles. She did not have any other injuries. Patient currently lives with her husband. Patient reported that she is also planning to have a colonoscopy next month. We discussed about her medications. She reported that she takes Klonopin on a when necessary basis. We discussed about decreasing the dose of Klonopin to decrease her confusion and she agreed with the plan. She is planning to come back for a follow-up appointment after her neurology appointment so we can discuss about her medications. She appeared calm and alert during the interview. She denied having any suicidal homicidal ideations or plans.      Chief Complaint:  Chief Complaint    Follow-up; Medication Refill     Visit Diagnosis:   No diagnosis found.  Past Medical History:  Past Medical History:  Diagnosis Date  . Anxiety   . Cardiomyopathy [facility])    new to her Jan 2017  . COPD (chronic obstructive pulmonary disease) ([employer])   . Depression   . Diabetes mellitus, type II ([place])   . HTN (hypertension)   . Obesity   . PONV (postoperative nausea and vomiting)   . Stroke [facility])    Jan 2017    Past Surgical History:  Procedure Laterality Date  . ABDOMINAL HYSTERECTOMY    . ANKLE FRACTURE SURGERY Left 2002  . BILATERAL SALPINGOOPHORECTOMY  2000  . CARDIAC CATHETERIZATION N/A 03/25/2015   Procedure: Right/Left Heart Cath and Coronary Angiography;  Surgeon: Belva Crome, MD;  Location: [place] [facility];  Service: Cardiovascular;  Laterality: N/A;  . [place]  . EP IMPLANTABLE DEVICE N/A 08/05/2015   Procedure: Loop Recorder Insertion;  Surgeon: Deboraha Sprang, MD;  Location: [place] [facility];  Service: Cardiovascular;  Laterality: N/A;  . KNEE ARTHROSCOPY Left 2005  . TEE WITHOUT CARDIOVERSION N/A 01/31/2015   Procedure: TRANSESOPHAGEAL ECHOCARDIOGRAM (TEE);  Surgeon: Lelon Perla, MD;  Location: [facility];  Service: Cardiovascular;  Laterality: N/A;  . [place]  . ULNAR NERVE TRANSPOSITION  2008   Family History:  Family History  Problem Relation Age of Onset  . Heart disease Mother        died from CHF  . Asthma Mother   . Diabetes Mother   . Heart disease Father   . Aneurysm Father   . Diabetes Brother   . Alcohol abuse Paternal Aunt   . Anemia Neg Hx   . Arrhythmia Neg Hx   . Clotting disorder Neg Hx   . Fainting Neg Hx   . Heart attack Neg Hx   . Heart failure Neg Hx   . Hyperlipidemia Neg Hx   . Hypertension Neg Hx    Social History:  Social History   Social History  . Marital status: Married    Spouse name: N/A  . Number of children: N/A  . Years of education: N/A   Social History Main Topics  .  Smoking status: Current Every Day Smoker    Packs/day: 1.00    Years: 35.00    Types: Cigarettes    Start date: 02/08/1974  . Smokeless tobacco: Never Used  . Alcohol use No  . Drug use: No  . Sexual activity: Yes    Birth control/ protection: None   Other Topics Concern  . None   Social History Narrative   Lives at home with stepson and husband, dogs and cats   Caffeine  Drinks sweet tea.   Right handed.    Additional History:  Married x 18 years. Has 2 children and Her husband has 2 children as well Assessment:   Musculoskeletal: Strength & Muscle Tone: within normal limits Gait & Station: normal Patient leans: N/A  Psychiatric Specialty Exam: Medication Refill   Associated symptoms include coughing, nausea and vomiting. Pertinent negatives include no chills, headaches or rash.    Review of Systems  Constitutional: Negative for chills and weight loss.  HENT: Negative for nosebleeds and tinnitus.   Eyes: Negative for double vision and discharge.  Respiratory: Positive for cough.   Cardiovascular: Negative for orthopnea.  Gastrointestinal: Positive for nausea and vomiting. Negative for blood in stool and diarrhea.  Genitourinary: Negative for urgency.  Skin: Negative for rash.  Neurological: Positive for sensory change, speech change and focal weakness. Negative for tingling and headaches.  Endo/Heme/Allergies: Negative for environmental allergies.  Psychiatric/Behavioral: Positive for depression.  All other systems reviewed and are negative.   Blood pressure 137/82, pulse 79, temperature 97.6 F (36.4 C), temperature source Oral, weight 227 lb 9.6 oz (103.2 kg).Body mass index is 36.74 kg/m.  General Appearance: Casual  Eye Contact:  Fair  Speech:  Slow  Volume:  Normal  Mood:  Anxious  Affect:  Congruent  Thought Process:  Coherent  Orientation:  Full (Time, Place, and Person)  Thought Content:  WDL  Suicidal Thoughts:  No  Homicidal Thoughts:  No  Memory:  Immediate;   Fair  Judgement:  Fair  Insight:  Fair  Psychomotor Activity:  Normal  Concentration:  Fair  Recall:  [employer] of Knowledge: Fair  Language: Fair  Akathisia:  No  Handed:  Right  AIMS (if indicated):  none  Assets:  Communication Skills Desire for Improvement Physical Health Social Support  ADL's:  Intact  Cognition: WNL  Sleep:  6-7   Is the patient at risk to self?  No. Has the patient been a risk to self in the past 6 months?  No. Has the patient been a risk to self within the distant past?  No. Is the patient a risk to others?  No. Has the patient been a risk to others in the past 6 months?  No. Has the patient been a risk to others within the  distant past?  No.  Current Medications: Current Outpatient Prescriptions  Medication Sig Dispense Refill  . aspirin 325 MG tablet Take 1 tablet (325 mg total) by mouth daily. 30 tablet 0  . atorvastatin (LIPITOR) 10 MG tablet TAKE 1 TABLET (10 MG TOTAL) BY MOUTH DAILY. 90 tablet 3  . B-D INS SYRINGE 0.5CC/31GX5/16 31G X 5/16" 0.5 ML MISC     . BYDUREON 2 MG SRER     . carvedilol (COREG) 12.5 MG tablet Take 1 tablet (12.5 mg total) by mouth 2 (two) times daily. 180 tablet 3  . clonazePAM (KLONOPIN) 0.5 MG tablet Take 0.5 tablets (0.25 mg total) by mouth at bedtime. Take one tablet (0.5  mg) by mouth once daily at bedtime as needed 15 tablet 1  . diclofenac (VOLTAREN) 75 MG EC tablet TAKE 1 TABLET BY MOUTH TWICE A DAY AS NEEDED 60 tablet 2  . glyBURIDE-metformin (GLUCOVANCE) 5-500 MG per tablet Take 2 tablets by mouth 2 (two) times daily.     Marland Kitchen loratadine (CLARITIN) 10 MG tablet Take 1 tablet by mouth daily.    Marland Kitchen losartan (COZAAR) 50 MG tablet TAKE 1 TABLET BY MOUTH EVERY DAY 30 tablet 6  . NOVOLIN 70/30 RELION (70-30) 100 UNIT/ML injection Inject 45 Units into the skin 2 (two) times daily with a meal.     . PARoxetine (PAXIL) 40 MG tablet Take 1 tablet (40 mg total) by mouth every morning. 90 tablet 1  . spironolactone (ALDACTONE) 25 MG tablet Take 1/2 tablet (12.5 mg) by mouth once daily     No current facility-administered medications for this visit.     Medical Decision Making:  Established Problem, Stable/Improving (1), Review of Psycho-Social Stressors (1) and Review of Last Therapy Session (1)  Treatment Plan Summary:Medication management   Depression and anxiety I will start her on Paxil 40 mg by mouth daily at bedtime to help with the depression.  She will continue on Klonopin when necessary basis  Follow-up Follow-up in 1 months or earlier depending on her symptoms   More than 50% of the time spent in psychoeducation, counseling and coordination of care.     This note  was generated in part or whole with voice recognition software. Voice regonition is usually quite accurate but there are transcription errors that can and very often do occur. I apologize for any typographical errors that were not detected and corrected.   Rainey Pines, MD

## 2016-06-30 ENCOUNTER — Ambulatory Visit (INDEPENDENT_AMBULATORY_CARE_PROVIDER_SITE_OTHER): Payer: Medicare Other | Admitting: *Deleted

## 2016-06-30 DIAGNOSIS — I639 Cerebral infarction, unspecified: Secondary | ICD-10-CM | POA: Diagnosis not present

## 2016-06-30 NOTE — Progress Notes (Signed)
Carelink Summary Report / Loop Recorder 

## 2016-07-01 ENCOUNTER — Other Ambulatory Visit: Payer: Self-pay | Admitting: Family Medicine

## 2016-07-02 LAB — CUP PACEART REMOTE DEVICE CHECK
MDC IDC PG IMPLANT DT: 20170627
MDC IDC SESS DTM: 20180523153621

## 2016-07-26 DIAGNOSIS — Z794 Long term (current) use of insulin: Secondary | ICD-10-CM | POA: Diagnosis not present

## 2016-07-26 DIAGNOSIS — N183 Chronic kidney disease, stage 3 (moderate): Secondary | ICD-10-CM | POA: Diagnosis not present

## 2016-07-26 DIAGNOSIS — F17200 Nicotine dependence, unspecified, uncomplicated: Secondary | ICD-10-CM | POA: Diagnosis not present

## 2016-07-26 DIAGNOSIS — E1165 Type 2 diabetes mellitus with hyperglycemia: Secondary | ICD-10-CM | POA: Diagnosis not present

## 2016-07-26 DIAGNOSIS — E1122 Type 2 diabetes mellitus with diabetic chronic kidney disease: Secondary | ICD-10-CM | POA: Diagnosis not present

## 2016-07-26 DIAGNOSIS — J069 Acute upper respiratory infection, unspecified: Secondary | ICD-10-CM | POA: Diagnosis not present

## 2016-07-26 DIAGNOSIS — E113393 Type 2 diabetes mellitus with moderate nonproliferative diabetic retinopathy without macular edema, bilateral: Secondary | ICD-10-CM | POA: Diagnosis not present

## 2016-07-30 ENCOUNTER — Ambulatory Visit (INDEPENDENT_AMBULATORY_CARE_PROVIDER_SITE_OTHER): Payer: Medicare Other | Admitting: *Deleted

## 2016-07-30 DIAGNOSIS — I639 Cerebral infarction, unspecified: Secondary | ICD-10-CM | POA: Diagnosis not present

## 2016-08-02 NOTE — Progress Notes (Signed)
Carelink Summary Report / Loop Recorder 

## 2016-08-05 ENCOUNTER — Ambulatory Visit
Admission: RE | Admit: 2016-08-05 | Discharge: 2016-08-05 | Disposition: A | Discharge status: Home / Self Care | Payer: Medicare Other | Source: Ambulatory Visit | Attending: Physician Assistant | Admitting: Physician Assistant

## 2016-08-05 ENCOUNTER — Ambulatory Visit (INDEPENDENT_AMBULATORY_CARE_PROVIDER_SITE_OTHER): Payer: Medicare Other | Admitting: Physician Assistant

## 2016-08-05 ENCOUNTER — Encounter: Payer: Self-pay | Admitting: Physician Assistant

## 2016-08-05 VITALS — BP 118/76 | HR 80 | Temp 98.5°F | Resp 16 | Wt 225.0 lb

## 2016-08-05 DIAGNOSIS — R0602 Shortness of breath: Secondary | ICD-10-CM | POA: Diagnosis not present

## 2016-08-05 DIAGNOSIS — R062 Wheezing: Secondary | ICD-10-CM | POA: Insufficient documentation

## 2016-08-05 DIAGNOSIS — Z794 Long term (current) use of insulin: Secondary | ICD-10-CM | POA: Diagnosis not present

## 2016-08-05 DIAGNOSIS — E113319 Type 2 diabetes mellitus with moderate nonproliferative diabetic retinopathy with macular edema, unspecified eye: Secondary | ICD-10-CM

## 2016-08-05 DIAGNOSIS — R110 Nausea: Secondary | ICD-10-CM | POA: Diagnosis not present

## 2016-08-05 DIAGNOSIS — J449 Chronic obstructive pulmonary disease, unspecified: Secondary | ICD-10-CM

## 2016-08-05 DIAGNOSIS — R05 Cough: Secondary | ICD-10-CM | POA: Diagnosis not present

## 2016-08-05 DIAGNOSIS — R058 Other specified cough: Secondary | ICD-10-CM

## 2016-08-05 MED ORDER — DOXYCYCLINE HYCLATE 100 MG PO TABS: 100.0000 mg | ORAL_TABLET | Freq: Two times a day (BID) | ORAL | 0 refills | Status: AC

## 2016-08-05 MED ORDER — ONDANSETRON HCL 4 MG PO TABS: 4.0000 mg | ORAL_TABLET | Freq: Three times a day (TID) | ORAL | 0 refills | Status: DC | PRN

## 2016-08-05 NOTE — Patient Instructions (Signed)

## 2016-08-05 NOTE — Progress Notes (Signed)
[place]  No chief complaint on file.   Subjective:    Patient ID: Jocelyn Wells, female    DOB: [birth year], 61 y.o.   MRN: [id number]  Upper Respiratory Infection: Jocelyn Wells is a 61 y.o. female with a past medical history significant for current smoking, pneumonia, DM, and COPD complaining of symptoms of a URI, possible sinusitis. Symptoms include congestion, cough and sore throat. Onset of symptoms was 2 weeks ago, unchanged since that time. She also c/o congestion, cough described as productive, lightheadedness, nasal congestion and shortness of breath for the past 2 weeks . She reports mucous comes from PND rather than up from her lungs. She is SOB at baseline, no wheezing today.  She is drinking plenty of fluids. Evaluation to date: none. Treatment to date: decongestants. The treatment has provided no relief.   Review of Systems  Constitutional: Positive for chills, diaphoresis and fatigue. Negative for activity change, appetite change, fever and unexpected weight change.  HENT: Positive for congestion, postnasal drip, rhinorrhea, sinus pain, sinus pressure, sneezing, sore throat and voice change. Negative for ear discharge, ear pain, facial swelling, hearing loss, nosebleeds, tinnitus and trouble swallowing.   Eyes: Positive for discharge and itching. Negative for photophobia, pain, redness and visual disturbance.  Respiratory: Positive for cough, chest tightness, shortness of breath and wheezing. Negative for apnea, choking and stridor.   Gastrointestinal: Positive for nausea. Negative for abdominal distention, abdominal pain, anal bleeding, blood in stool, constipation, diarrhea, rectal pain and vomiting.  Neurological: Positive for dizziness, light-headedness and headaches.       Objective:   BP 118/76 (BP Location: Left Arm, Patient Position: Sitting, Cuff Size: Large)   Pulse 80   Temp 98.5 F (36.9 C) (Oral)   Resp 16   Wt 225  lb (102.1 kg)   SpO2 94%   BMI 36.32 kg/m   Patient Active Problem List   Diagnosis Date Noted  . Depression (emotion) 06/12/2015  . Cerebrovascular accident (CVA) due to bilateral thrombosis of carotid arteries ([place]) 06/12/2015  . Excessive daytime sleepiness 06/12/2015  . Nicotine dependence 04/24/2015  . Snoring 04/24/2015  . Sleep paralysis, recurrent isolated 04/24/2015  . Hypersomnia with sleep apnea 04/24/2015  . Cataplexy 04/24/2015  . Morbid obesity due to excess calories ([place]) 04/24/2015  . COPD exacerbation ([place]) 04/24/2015  . Cerebrovascular accident (CVA) due to thrombosis of left middle cerebral artery ([place]) 04/24/2015  . Embolic stroke ([place]) 12/87/8676  . Fatigue 04/23/2015  . Type 2 diabetes mellitus ([place]) 04/07/2015  . Chronic systolic heart failure ([place]) 03/25/2015  . Stroke ([place]) 01/31/2015  . Nausea & vomiting 01/29/2015  . AKI (acute kidney injury) ([place]) 01/29/2015  . CVA (cerebral infarction) 01/29/2015  . Stroke (cerebrum) ([place]) 01/29/2015  . Dizziness and giddiness 07/05/2014  . Blood pressure elevated 07/05/2014  . Carbuncle and furuncle 07/05/2014  . BP (high blood pressure) 07/05/2014  . Does not feel right 07/05/2014  . Pain of perianal area 07/05/2014  . Guttate psoriasis 07/05/2014  . Gonalgia 07/05/2014  . H/O diabetes mellitus 06/05/2014  . CAFL (chronic airflow limitation) ([place]) 06/05/2014  . H/O visual disturbance 06/05/2014  . H/O elevated lipids 06/05/2014  . H/O: HTN (hypertension) 06/05/2014  . H/O: osteoarthritis 06/05/2014  . Initial insomnia 06/05/2014  . Anxiety, generalized 06/05/2014  . Depression, major, recurrent, moderate ([place]) 06/05/2014  . Microalbuminuria 12/23/2013  . Long term current use of insulin ([place]) 12/23/2013  . Compulsive tobacco user syndrome 12/23/2013  .  Type 2 diabetes mellitus with other diabetic kidney complication ([place]) 96/05/5407  . Contusion of cheek 03/31/2009  . Change in blood platelet count  06/08/2007  . Blood in feces 06/01/2007  . D (diarrhea) 06/01/2007  . Disturbance of skin sensation 09/02/2006  . Difficulty hearing 07/25/2006  . Acute onset aura migraine 06/27/2006  . Cephalalgia 05/27/2006  . Clinical depression 10/22/2005  . Diabetes mellitus type 2, uncontrolled ([place]) 10/22/2005  . HLD (hyperlipidemia) 10/22/2005  . Arthritis, degenerative 10/22/2005  . Current tobacco use 10/22/2005    Outpatient Encounter Prescriptions as of 08/05/2016  Medication Sig Note  . aspirin 325 MG tablet Take 1 tablet (325 mg total) by mouth daily.   Marland Kitchen atorvastatin (LIPITOR) 10 MG tablet TAKE 1 TABLET (10 MG TOTAL) BY MOUTH DAILY.   Marland Kitchen B-D INS SYRINGE 0.5CC/31GX5/16 31G X 5/16" 0.5 ML MISC    . BYDUREON 2 MG SRER  08/28/2015: Received from: External Pharmacy  . carvedilol (COREG) 12.5 MG tablet Take 1 tablet (12.5 mg total) by mouth 2 (two) times daily.   . clonazePAM (KLONOPIN) 0.5 MG tablet Take 0.5 tablets (0.25 mg total) by mouth at bedtime. Take one tablet (0.5 mg) by mouth once daily at bedtime as needed   . diclofenac (VOLTAREN) 75 MG EC tablet TAKE 1 TABLET BY MOUTH TWICE A DAY AS NEEDED   . glyBURIDE-metformin (GLUCOVANCE) 5-500 MG per tablet Take 2 tablets by mouth 2 (two) times daily.    Marland Kitchen loratadine (CLARITIN) 10 MG tablet Take 1 tablet by mouth daily.   Marland Kitchen losartan (COZAAR) 50 MG tablet TAKE 1 TABLET BY MOUTH EVERY DAY   . NOVOLIN 70/30 RELION (70-30) 100 UNIT/ML injection Inject 45 Units into the skin 2 (two) times daily with a meal.  10/28/2015: Received from: External Pharmacy  . PARoxetine (PAXIL) 40 MG tablet Take 1 tablet (40 mg total) by mouth daily.   Marland Kitchen spironolactone (ALDACTONE) 25 MG tablet Take 1/2 tablet (12.5 mg) by mouth once daily   . doxycycline (VIBRA-TABS) 100 MG tablet Take 1 tablet (100 mg total) by mouth 2 (two) times daily.   . ondansetron (ZOFRAN) 4 MG tablet Take 1 tablet (4 mg total) by mouth every 8 (eight) hours as needed for nausea or vomiting.     No facility-administered encounter medications on file as of 08/05/2016.     Allergies  Allergen Reactions  . Codeine Nausea And Vomiting       Physical Exam  Constitutional: She is oriented to person, place, and time. She appears well-developed and well-nourished.  HENT:  Mouth/Throat: Posterior oropharyngeal erythema present. No oropharyngeal exudate.  Cardiovascular: Normal rate and regular rhythm.   Pulmonary/Chest: Effort normal. No respiratory distress. She has wheezes. She has no rales.  Mild expiratory wheeze in LLF.  Lymphadenopathy:    She has no cervical adenopathy.  Neurological: She is alert and oriented to person, place, and time.  Skin: Skin is warm and dry.  Psychiatric: She has a normal mood and affect. Her behavior is normal.       Assessment & Plan:  1. Respiratory tract congestion with cough  Eval and treat as below. Suspect more sinus in nature as lungs sound pretty clear with occasional expiratory wheeze and patient's SOB at baseline. Will defer prednisone right now 2/2 diabetes. CXR to r/o pneumonia.   - DG Chest 2 View; Future - doxycycline (VIBRA-TABS) 100 MG tablet; Take 1 tablet (100 mg total) by mouth 2 (two) times daily.  Dispense: 20 tablet;  Refill: 0  2. Wheeze  Mild wheezing on exam in left lower field.  - DG Chest 2 View; Future - doxycycline (VIBRA-TABS) 100 MG tablet; Take 1 tablet (100 mg total) by mouth 2 (two) times daily.  Dispense: 20 tablet; Refill: 0  3. Nausea  - ondansetron (ZOFRAN) 4 MG tablet; Take 1 tablet (4 mg total) by mouth every 8 (eight) hours as needed for nausea or vomiting.  Dispense: 20 tablet; Refill: 0  4. CAFL (chronic airflow limitation) ([facility])  Currently smoking. Trying to quit.  5. Type 2 diabetes mellitus with moderate nonproliferative retinopathy and macular edema, with long-term current use of insulin, unspecified laterality ([facility])  Uncontrolled.  Return if symptoms worsen or fail to improve.  The  entirety of the information documented in the History of Present Illness, Review of Systems and Physical Exam were personally obtained by me. Portions of this information were initially documented by Ashley Royalty, CMA and reviewed by me for thoroughness and accuracy.     Patient Instructions  Community-Acquired Pneumonia, Adult Pneumonia is an infection of the lungs. One type of pneumonia can happen while a person is in a hospital. A different type can happen when a person is not in a hospital (community-acquired pneumonia). It is easy for this kind to spread from person to person. It can spread to you if you breathe near an infected person who coughs or sneezes. Some symptoms include:  A dry cough.  A wet (productive) cough.  Fever.  Sweating.  Chest pain.  Follow these instructions at home:  Take over-the-counter and prescription medicines only as told by your doctor. ? Only take cough medicine if you are losing sleep. ? If you were prescribed an antibiotic medicine, take it as told by your doctor. Do not stop taking the antibiotic even if you start to feel better.  Sleep with your head and neck raised (elevated). You can do this by putting a few pillows under your head, or you can sleep in a recliner.  Do not use tobacco products. These include cigarettes, chewing tobacco, and e-cigarettes. If you need help quitting, ask your doctor.  Drink enough water to keep your pee (urine) clear or pale yellow. A shot (vaccine) can help prevent pneumonia. Shots are often suggested for:  People older than 62 years of age.  People older than 62 years of age: ? Who are having cancer treatment. ? Who have long-term (chronic) lung disease. ? Who have problems with their body's defense system (immune system).  You may also prevent pneumonia if you take these actions:  Get the flu (influenza) shot every year.  Go to the dentist as often as told.  Wash your hands often. If soap and water  are not available, use hand sanitizer.  Contact a doctor if:  You have a fever.  You lose sleep because your cough medicine does not help. Get help right away if:  You are short of breath and it gets worse.  You have more chest pain.  Your sickness gets worse. This is very serious if: ? You are an older adult. ? Your body's defense system is weak.  You cough up blood. This information is not intended to replace advice given to you by your health care provider. Make sure you discuss any questions you have with your health care provider. Document Released: 07/14/2007 Document Revised: 07/03/2015 Document Reviewed: 05/22/2014 Elsevier Interactive Patient Education  [employer].     The entirety of the information  documented in the History of Present Illness, Review of Systems and Physical Exam were personally obtained by me. Portions of this information were initially documented by Ashley Royalty, CMA and reviewed by me for thoroughness and accuracy.

## 2016-08-06 ENCOUNTER — Telehealth: Payer: Self-pay

## 2016-08-06 LAB — CUP PACEART REMOTE DEVICE CHECK
Implantable Pulse Generator Implant Date: 20170627
MDC IDC SESS DTM: 20180622160813

## 2016-08-06 NOTE — Telephone Encounter (Signed)
Pt advised.   Thanks,   -Laura  

## 2016-08-06 NOTE — Telephone Encounter (Signed)
-----   Message from Trinna Post, [place] sent at 08/06/2016  8:18 AM EDT ----- No evidence of pneumonia or other cardiopulmonary disease. Please complete course of antibiotics. Do have half day clinic from 9-12 on Saturday if she is worsening, thank you.

## 2016-08-23 ENCOUNTER — Ambulatory Visit: Payer: Medicare Other | Admitting: Psychiatry

## 2016-08-30 ENCOUNTER — Ambulatory Visit (INDEPENDENT_AMBULATORY_CARE_PROVIDER_SITE_OTHER): Payer: Medicare Other | Admitting: *Deleted

## 2016-08-30 DIAGNOSIS — I639 Cerebral infarction, unspecified: Secondary | ICD-10-CM | POA: Diagnosis not present

## 2016-08-30 DIAGNOSIS — E113393 Type 2 diabetes mellitus with moderate nonproliferative diabetic retinopathy without macular edema, bilateral: Secondary | ICD-10-CM | POA: Diagnosis not present

## 2016-09-01 ENCOUNTER — Telehealth: Payer: Self-pay | Admitting: Cardiology

## 2016-09-01 NOTE — Telephone Encounter (Signed)
Spoke w/ pt and requested that she send a manual transmission b/c her home monitor has not updated in at least 14 days.   

## 2016-09-01 NOTE — Progress Notes (Signed)
Carelink Summary Report / Loop Recorder 

## 2016-09-06 DIAGNOSIS — E113213 Type 2 diabetes mellitus with mild nonproliferative diabetic retinopathy with macular edema, bilateral: Secondary | ICD-10-CM | POA: Diagnosis not present

## 2016-09-06 LAB — HM DIABETES EYE EXAM

## 2016-09-08 ENCOUNTER — Encounter: Payer: Self-pay | Admitting: Family Medicine

## 2016-09-08 DIAGNOSIS — N183 Chronic kidney disease, stage 3 (moderate): Secondary | ICD-10-CM | POA: Diagnosis not present

## 2016-09-08 DIAGNOSIS — E1165 Type 2 diabetes mellitus with hyperglycemia: Secondary | ICD-10-CM | POA: Diagnosis not present

## 2016-09-08 DIAGNOSIS — Z794 Long term (current) use of insulin: Secondary | ICD-10-CM | POA: Diagnosis not present

## 2016-09-08 DIAGNOSIS — E1122 Type 2 diabetes mellitus with diabetic chronic kidney disease: Secondary | ICD-10-CM | POA: Diagnosis not present

## 2016-09-08 LAB — HEMOGLOBIN A1C: Hemoglobin A1C: 12.9

## 2016-09-10 ENCOUNTER — Encounter: Payer: Self-pay | Admitting: Cardiology

## 2016-09-12 ENCOUNTER — Other Ambulatory Visit: Payer: Self-pay | Admitting: Family Medicine

## 2016-09-13 LAB — CUP PACEART REMOTE DEVICE CHECK
Date Time Interrogation Session: 20180722163709
MDC IDC PG IMPLANT DT: 20170627

## 2016-09-13 NOTE — Progress Notes (Signed)
Carelink summary report received. Battery status OK. Normal device function. No new symptom episodes, tachy episodes, brady, or pause episodes. No new AF episodes. Monthly summary reports and ROV/PRN 

## 2016-09-15 ENCOUNTER — Other Ambulatory Visit: Payer: Self-pay | Admitting: Family Medicine

## 2016-09-15 DIAGNOSIS — E1129 Type 2 diabetes mellitus with other diabetic kidney complication: Secondary | ICD-10-CM | POA: Diagnosis not present

## 2016-09-15 DIAGNOSIS — E1165 Type 2 diabetes mellitus with hyperglycemia: Secondary | ICD-10-CM | POA: Diagnosis not present

## 2016-09-15 DIAGNOSIS — R809 Proteinuria, unspecified: Secondary | ICD-10-CM | POA: Diagnosis not present

## 2016-09-15 DIAGNOSIS — E1122 Type 2 diabetes mellitus with diabetic chronic kidney disease: Secondary | ICD-10-CM | POA: Diagnosis not present

## 2016-09-15 DIAGNOSIS — F17200 Nicotine dependence, unspecified, uncomplicated: Secondary | ICD-10-CM | POA: Diagnosis not present

## 2016-09-15 DIAGNOSIS — R6889 Other general symptoms and signs: Secondary | ICD-10-CM | POA: Diagnosis not present

## 2016-09-15 DIAGNOSIS — N183 Chronic kidney disease, stage 3 (moderate): Secondary | ICD-10-CM | POA: Diagnosis not present

## 2016-09-15 DIAGNOSIS — Z9119 Patient's noncompliance with other medical treatment and regimen: Secondary | ICD-10-CM | POA: Diagnosis not present

## 2016-09-15 DIAGNOSIS — E113393 Type 2 diabetes mellitus with moderate nonproliferative diabetic retinopathy without macular edema, bilateral: Secondary | ICD-10-CM | POA: Diagnosis not present

## 2016-09-15 DIAGNOSIS — Z794 Long term (current) use of insulin: Secondary | ICD-10-CM | POA: Diagnosis not present

## 2016-09-15 NOTE — Telephone Encounter (Signed)
Refill Request  Medication: Diclofenac 75 MG EC tablet

## 2016-09-22 ENCOUNTER — Encounter
Admission: RE | Disposition: A | Discharge status: Home / Self Care | Payer: Self-pay | Source: Ambulatory Visit | Attending: Unknown Physician Specialty

## 2016-09-22 ENCOUNTER — Ambulatory Visit: Payer: Medicare Other | Admitting: Anesthesiology

## 2016-09-22 ENCOUNTER — Encounter: Payer: Self-pay | Admitting: *Deleted

## 2016-09-22 ENCOUNTER — Ambulatory Visit
Admission: RE | Admit: 2016-09-22 | Discharge: 2016-09-22 | Disposition: A | Discharge status: Home / Self Care | Payer: Medicare Other | Source: Ambulatory Visit | Attending: Unknown Physician Specialty | Admitting: Unknown Physician Specialty

## 2016-09-22 DIAGNOSIS — Z7982 Long term (current) use of aspirin: Secondary | ICD-10-CM | POA: Insufficient documentation

## 2016-09-22 DIAGNOSIS — K2950 Unspecified chronic gastritis without bleeding: Secondary | ICD-10-CM | POA: Diagnosis not present

## 2016-09-22 DIAGNOSIS — Z8673 Personal history of transient ischemic attack (TIA), and cerebral infarction without residual deficits: Secondary | ICD-10-CM | POA: Insufficient documentation

## 2016-09-22 DIAGNOSIS — I10 Essential (primary) hypertension: Secondary | ICD-10-CM | POA: Diagnosis not present

## 2016-09-22 DIAGNOSIS — K648 Other hemorrhoids: Secondary | ICD-10-CM | POA: Diagnosis not present

## 2016-09-22 DIAGNOSIS — Z79899 Other long term (current) drug therapy: Secondary | ICD-10-CM | POA: Diagnosis not present

## 2016-09-22 DIAGNOSIS — Z86010 Personal history of colonic polyps: Secondary | ICD-10-CM | POA: Diagnosis not present

## 2016-09-22 DIAGNOSIS — F419 Anxiety disorder, unspecified: Secondary | ICD-10-CM | POA: Insufficient documentation

## 2016-09-22 DIAGNOSIS — J449 Chronic obstructive pulmonary disease, unspecified: Secondary | ICD-10-CM | POA: Diagnosis not present

## 2016-09-22 DIAGNOSIS — Z794 Long term (current) use of insulin: Secondary | ICD-10-CM | POA: Diagnosis not present

## 2016-09-22 DIAGNOSIS — Z8719 Personal history of other diseases of the digestive system: Secondary | ICD-10-CM | POA: Diagnosis not present

## 2016-09-22 DIAGNOSIS — E669 Obesity, unspecified: Secondary | ICD-10-CM | POA: Diagnosis not present

## 2016-09-22 DIAGNOSIS — I429 Cardiomyopathy, unspecified: Secondary | ICD-10-CM | POA: Diagnosis not present

## 2016-09-22 DIAGNOSIS — R1310 Dysphagia, unspecified: Secondary | ICD-10-CM | POA: Diagnosis not present

## 2016-09-22 DIAGNOSIS — F329 Major depressive disorder, single episode, unspecified: Secondary | ICD-10-CM | POA: Insufficient documentation

## 2016-09-22 DIAGNOSIS — F17210 Nicotine dependence, cigarettes, uncomplicated: Secondary | ICD-10-CM | POA: Insufficient documentation

## 2016-09-22 DIAGNOSIS — K2970 Gastritis, unspecified, without bleeding: Secondary | ICD-10-CM | POA: Diagnosis not present

## 2016-09-22 DIAGNOSIS — E119 Type 2 diabetes mellitus without complications: Secondary | ICD-10-CM | POA: Diagnosis not present

## 2016-09-22 DIAGNOSIS — K640 First degree hemorrhoids: Secondary | ICD-10-CM | POA: Diagnosis not present

## 2016-09-22 DIAGNOSIS — Z09 Encounter for follow-up examination after completed treatment for conditions other than malignant neoplasm: Secondary | ICD-10-CM | POA: Diagnosis not present

## 2016-09-22 DIAGNOSIS — K259 Gastric ulcer, unspecified as acute or chronic, without hemorrhage or perforation: Secondary | ICD-10-CM | POA: Diagnosis not present

## 2016-09-22 DIAGNOSIS — K2960 Other gastritis without bleeding: Secondary | ICD-10-CM | POA: Diagnosis not present

## 2016-09-22 HISTORY — PX: ESOPHAGOGASTRODUODENOSCOPY (EGD) WITH PROPOFOL: SHX5813

## 2016-09-22 HISTORY — PX: COLONOSCOPY WITH PROPOFOL: SHX5780

## 2016-09-22 LAB — GLUCOSE, CAPILLARY: Glucose-Capillary: 129 mg/dL — ABNORMAL HIGH (ref 65–99)

## 2016-09-22 SURGERY — ESOPHAGOGASTRODUODENOSCOPY (EGD) WITH PROPOFOL
Anesthesia: General

## 2016-09-22 MED ORDER — CARVEDILOL 12.5 MG PO TABS
12.5000 mg | ORAL_TABLET | ORAL | Status: AC
  Administered 2016-09-22: 12.5 mg via ORAL

## 2016-09-22 MED ORDER — SODIUM CHLORIDE 0.9 % IV SOLN
INTRAVENOUS | Status: DC
  Administered 2016-09-22: 08:00:00 via INTRAVENOUS

## 2016-09-22 MED ORDER — FENTANYL CITRATE (PF) 100 MCG/2ML IJ SOLN
INTRAMUSCULAR | Status: AC
  Filled 2016-09-22: qty 2

## 2016-09-22 MED ORDER — EPHEDRINE SULFATE 50 MG/ML IJ SOLN
INTRAMUSCULAR | Status: DC | PRN
  Administered 2016-09-22 (×2): 10 mg via INTRAVENOUS
  Administered 2016-09-22: 5 mg via INTRAVENOUS

## 2016-09-22 MED ORDER — PROPOFOL 10 MG/ML IV BOLUS
INTRAVENOUS | Status: DC | PRN
  Administered 2016-09-22: 30 mg via INTRAVENOUS
  Administered 2016-09-22: 20 mg via INTRAVENOUS

## 2016-09-22 MED ORDER — SODIUM CHLORIDE 0.9 % IV SOLN: INTRAVENOUS | Status: DC

## 2016-09-22 MED ORDER — PROPOFOL 500 MG/50ML IV EMUL
INTRAVENOUS | Status: AC
  Filled 2016-09-22: qty 50

## 2016-09-22 MED ORDER — MIDAZOLAM HCL 5 MG/5ML IJ SOLN
INTRAMUSCULAR | Status: DC | PRN
  Administered 2016-09-22: 2 mg via INTRAVENOUS

## 2016-09-22 MED ORDER — CARVEDILOL 12.5 MG PO TABS
ORAL_TABLET | ORAL | Status: AC
  Filled 2016-09-22: qty 1

## 2016-09-22 MED ORDER — LIDOCAINE HCL (PF) 2 % IJ SOLN
INTRAMUSCULAR | Status: DC | PRN
  Administered 2016-09-22: 50 mg

## 2016-09-22 MED ORDER — PROPOFOL 500 MG/50ML IV EMUL
INTRAVENOUS | Status: DC | PRN
  Administered 2016-09-22: 75 ug/kg/min via INTRAVENOUS

## 2016-09-22 MED ORDER — PROPOFOL 10 MG/ML IV BOLUS
INTRAVENOUS | Status: AC
  Filled 2016-09-22: qty 20

## 2016-09-22 MED ORDER — MIDAZOLAM HCL 2 MG/2ML IJ SOLN
INTRAMUSCULAR | Status: AC
  Filled 2016-09-22: qty 2

## 2016-09-22 MED ORDER — FENTANYL CITRATE (PF) 100 MCG/2ML IJ SOLN
INTRAMUSCULAR | Status: DC | PRN
  Administered 2016-09-22: 50 ug via INTRAVENOUS
  Administered 2016-09-22 (×2): 25 ug via INTRAVENOUS

## 2016-09-22 NOTE — Anesthesia Preprocedure Evaluation (Signed)
Anesthesia Evaluation  Patient identified by MRN, date of birth, ID band Patient awake    Reviewed: Allergy & Precautions, NPO status , Patient's Chart, lab work & pertinent test results  History of Anesthesia Complications (+) PONV and history of anesthetic complications  Airway Mallampati: II       Dental   Pulmonary COPD, Current Smoker,           Cardiovascular hypertension, Pt. on medications + Peripheral Vascular Disease       Neuro/Psych Anxiety Depression CVA (affected speech and R hand), No Residual Symptoms    GI/Hepatic Neg liver ROS, GERD  Medicated and Controlled,  Endo/Other  diabetes, Type 2, Oral Hypoglycemic Agents  Renal/GU Renal InsufficiencyRenal disease     Musculoskeletal   Abdominal   Peds  Hematology   Anesthesia Other Findings   Reproductive/Obstetrics                             Anesthesia Physical Anesthesia Plan  ASA: III  Anesthesia Plan: General   Post-op Pain Management:    Induction:   PONV Risk Score and Plan:   Airway Management Planned:   Additional Equipment:   Intra-op Plan:   Post-operative Plan:   Informed Consent: I have reviewed the patients History and Physical, chart, labs and discussed the procedure including the risks, benefits and alternatives for the proposed anesthesia with the patient or authorized representative who has indicated his/her understanding and acceptance.     Plan Discussed with:   Anesthesia Plan Comments:         Anesthesia Quick Evaluation

## 2016-09-22 NOTE — Anesthesia Postprocedure Evaluation (Signed)
Anesthesia Post Note  Patient: Jocelyn Wells  Procedure(s) Performed: Procedure(s) (LRB): ESOPHAGOGASTRODUODENOSCOPY (EGD) WITH PROPOFOL (N/A) COLONOSCOPY WITH PROPOFOL (N/A)  Patient location during evaluation: Endoscopy Anesthesia Type: General Level of consciousness: awake and alert Pain management: pain level controlled Vital Signs Assessment: post-procedure vital signs reviewed and stable Respiratory status: spontaneous breathing and respiratory function stable Cardiovascular status: stable Anesthetic complications: no     Last Vitals:  Vitals:   09/22/16 0823 09/22/16 0833  BP: (!) 119/55 133/65  Pulse: 84 77  Resp: 20 18  Temp: (!) 36.1 C   SpO2: 95% 100%    Last Pain:  Vitals:   09/22/16 0833  TempSrc:   PainSc: 0-No pain                 Shakeitha Umbaugh K

## 2016-09-22 NOTE — H&P (Signed)
Primary Care Physician:  Margo Common, PA Primary Gastroenterologist:  Dr. Vira Agar  Pre-Procedure History & Physical: HPI:  Jocelyn Wells is a 61 y.o. female is here for an endoscopy and colonoscopy.   Past Medical History:  Diagnosis Date  . Anxiety   . Cardiomyopathy [facility])    new to her Jan 2017  . COPD (chronic obstructive pulmonary disease) ([place])   . Depression   . Diabetes mellitus, type II ([place])   . HTN (hypertension)   . Obesity   . PONV (postoperative nausea and vomiting)   . Stroke [facility])    Jan 2017    Past Surgical History:  Procedure Laterality Date  . ABDOMINAL HYSTERECTOMY    . ANKLE FRACTURE SURGERY Left 2002  . BILATERAL SALPINGOOPHORECTOMY  2000  . CARDIAC CATHETERIZATION N/A 03/25/2015   Procedure: Right/Left Heart Cath and Coronary Angiography;  Surgeon: Belva Crome, MD;  Location: [facility] CV LAB;  Service: Cardiovascular;  Laterality: N/A;  . [place]  . EP IMPLANTABLE DEVICE N/A 08/05/2015   Procedure: Loop Recorder Insertion;  Surgeon: Deboraha Sprang, MD;  Location: [place] CV LAB;  Service: Cardiovascular;  Laterality: N/A;  . KNEE ARTHROSCOPY Left 2005  . TEE WITHOUT CARDIOVERSION N/A 01/31/2015   Procedure: TRANSESOPHAGEAL ECHOCARDIOGRAM (TEE);  Surgeon: Lelon Perla, MD;  Location: [facility];  Service: Cardiovascular;  Laterality: N/A;  . [place]  . ULNAR NERVE TRANSPOSITION  2008    Prior to Admission medications   Medication Sig Start Date End Date Taking? Authorizing Provider  aspirin 325 MG tablet Take 1 tablet (325 mg total) by mouth daily. 01/31/15  Yes Elgergawy, Silver Huguenin, MD  atorvastatin (LIPITOR) 10 MG tablet TAKE 1 TABLET (10 MG TOTAL) BY MOUTH DAILY. 05/10/16  Yes Chrismon, Vickki Muff, PA  BYDUREON 2 MG SRER  08/26/15  Yes [provider]  carvedilol (COREG) 12.5 MG tablet Take 1 tablet (12.5 mg total) by mouth 2 (two) times daily. 03/12/16  Yes Deboraha Sprang, MD  clonazePAM  (KLONOPIN) 0.5 MG tablet Take 0.5 tablets (0.25 mg total) by mouth at bedtime. Take one tablet (0.5 mg) by mouth once daily at bedtime as needed 06/23/16  Yes Rainey Pines, MD  diclofenac (VOLTAREN) 75 MG EC tablet TAKE 1 TABLET BY MOUTH TWICE A DAY AS NEEDED 09/16/16  Yes Chrismon, Vickki Muff, PA  glyBURIDE-metformin (GLUCOVANCE) 5-500 MG per tablet Take 2 tablets by mouth 2 (two) times daily.    Yes [provider]  ketoconazole (NIZORAL) 2 % cream Apply 1 application topically daily.   Yes [provider]  losartan (COZAAR) 50 MG tablet TAKE 1 TABLET BY MOUTH EVERY DAY 09/13/16  Yes Chrismon, Vickki Muff, PA  NOVOLIN 70/30 RELION (70-30) 100 UNIT/ML injection Inject 45 Units into the skin 2 (two) times daily with a meal.  10/06/15  Yes [provider]  PARoxetine (PAXIL) 40 MG tablet Take 1 tablet (40 mg total) by mouth daily. 06/23/16  Yes Rainey Pines, MD  spironolactone (ALDACTONE) 25 MG tablet Take 1/2 tablet (12.5 mg) by mouth once daily   Yes [provider]  Apremilast (OTEZLA) 30 MG TABS Take by mouth.    [provider]  B-D INS SYRINGE 0.5CC/31GX5/16 31G X 5/16" 0.5 ML MISC  02/23/16   [provider]  fluticasone (FLONASE) 50 MCG/ACT nasal spray Place into both nostrils daily.    [provider]  loratadine (CLARITIN) 10 MG tablet Take  1 tablet by mouth daily.    [provider]  ondansetron (ZOFRAN) 4 MG tablet Take 1 tablet (4 mg total) by mouth every 8 (eight) hours as needed for nausea or vomiting. Patient not taking: Reported on 09/22/2016 08/05/16   Trinna Post, PA-C    Allergies as of 05/03/2016 - Review Complete 04/05/2016  Allergen Reaction Noted  . Codeine Nausea And Vomiting 07/05/2014    Family History  Problem Relation Age of Onset  . Heart disease Mother        died from CHF  . Asthma Mother   . Diabetes Mother   . Heart disease Father   . Aneurysm Father   . Diabetes Brother   . Alcohol abuse  Paternal Aunt   . Anemia Neg Hx   . Arrhythmia Neg Hx   . Clotting disorder Neg Hx   . Fainting Neg Hx   . Heart attack Neg Hx   . Heart failure Neg Hx   . Hyperlipidemia Neg Hx   . Hypertension Neg Hx     Social History   Social History  . Marital status: Married    Spouse name: N/A  . Number of children: N/A  . Years of education: N/A   Occupational History  . Not on file.   Social History Main Topics  . Smoking status: Current Every Day Smoker    Packs/day: 1.00    Years: 35.00    Types: Cigarettes    Start date: 02/08/1974  . Smokeless tobacco: Never Used  . Alcohol use No  . Drug use: No  . Sexual activity: Yes    Birth control/ protection: None   Other Topics Concern  . Not on file   Social History Narrative   Lives at home with stepson and husband, dogs and cats   Caffeine  Drinks sweet tea.   Right handed.     Review of Systems: See HPI, otherwise negative ROS  Physical Exam: BP (!) 167/78   Pulse 67   Temp (!) 96.3 F (35.7 C) (Tympanic)   Resp 18   Ht 5\' 6"  (1.676 m)   Wt 104.3 kg (230 lb)   SpO2 100%   BMI 37.12 kg/m  General:   Alert,  pleasant and cooperative in NAD Head:  Normocephalic and atraumatic. Neck:  Supple; no masses or thyromegaly. Lungs:  Clear throughout to auscultation.    Heart:  Regular rate and rhythm. Abdomen:  Soft, nontender and nondistended. Normal bowel sounds, without guarding, and without rebound.   Neurologic:  Alert and  oriented x4;  grossly normal neurologically.  Impression/Plan: Jocelyn Wells is here for an endoscopy and colonoscopy to be performed for dysphagia and PH colon polyps.  Risks, benefits, limitations, and alternatives regarding  endoscopy and colonoscopy have been reviewed with the patient.  Questions have been answered.  All parties agreeable.   Gaylyn Cheers, MD  09/22/2016, 7:31 AM

## 2016-09-22 NOTE — Op Note (Signed)
[facility] Gastroenterology Patient Name: Jocelyn Wells Procedure Date: 09/22/2016 7:34 AM MRN: [id number] Account #: [id number] Date of Birth: [birth year] Admit Type: Outpatient Age: 61 Room: [facility] ENDO ROOM 3 Gender: Female Note Status: Finalized Procedure:            Colonoscopy Indications:          High risk colon cancer surveillance: Personal history                        of colonic polyps Providers:            Manya Silvas, MD Referring MD:         Vickki Muff. Chrismon, MD (Referring MD) Medicines:            Propofol per Anesthesia Complications:        No immediate complications. Procedure:            Pre-Anesthesia Assessment:                       - After reviewing the risks and benefits, the patient                        was deemed in satisfactory condition to undergo the                        procedure.                       After obtaining informed consent, the colonoscope was                        passed under direct vision. Throughout the procedure,                        the patient's blood pressure, pulse, and oxygen                        saturations were monitored continuously. The                        Colonoscope was introduced through the anus and                        advanced to the the cecum, identified by appendiceal                        orifice and ileocecal valve. The colonoscopy was                        somewhat difficult due to restricted mobility of the                        colon. Successful completion of the procedure was aided                        by applying abdominal pressure. The patient tolerated                        the procedure well. The quality of the bowel  preparation was good. Findings:      Internal hemorrhoids were found during endoscopy. The hemorrhoids were       small and Grade I (internal hemorrhoids that do not prolapse).      The exam was otherwise without  abnormality. Impression:           - Internal hemorrhoids.                       - The examination was otherwise normal.                       - No specimens collected. Recommendation:       - Repeat colonoscopy in 5 years for surveillance. Manya Silvas, MD 09/22/2016 8:20:43 AM This report has been signed electronically. Number of Addenda: 0 Note Initiated On: 09/22/2016 7:34 AM Scope Withdrawal Time: 0 hours 7 minutes 6 seconds  Total Procedure Duration: 0 hours 23 minutes 19 seconds       [facility]

## 2016-09-22 NOTE — Anesthesia Post-op Follow-up Note (Signed)
Anesthesia QCDR form completed.        

## 2016-09-22 NOTE — Transfer of Care (Signed)
Immediate Anesthesia Transfer of Care Note  Patient: Jocelyn Wells  Procedure(s) Performed: Procedure(s): ESOPHAGOGASTRODUODENOSCOPY (EGD) WITH PROPOFOL (N/A) COLONOSCOPY WITH PROPOFOL (N/A)  Patient Location: PACU  Anesthesia Type:General  Level of Consciousness: sedated  Airway & Oxygen Therapy: Patient Spontanous Breathing and Patient connected to nasal cannula oxygen  Post-op Assessment: Report given to RN and Post -op Vital signs reviewed and stable  Post vital signs: Reviewed and stable  Last Vitals:  Vitals:   09/22/16 0705  BP: (!) 167/78  Pulse: 67  Resp: 18  Temp: (!) 35.7 C  SpO2: 100%    Last Pain:  Vitals:   09/22/16 0705  TempSrc: Tympanic  PainSc: 0-No pain         Complications: No apparent anesthesia complications

## 2016-09-22 NOTE — Op Note (Signed)
[facility] Gastroenterology Patient Name: Jocelyn Wells Procedure Date: 09/22/2016 7:34 AM MRN: [id number] Account #: [id number] Date of Birth: 07/04/1955 Admit Type: Outpatient Age: 61 Room: [facility] ENDO ROOM 3 Gender: Female Note Status: Finalized Procedure:            Upper GI endoscopy Indications:          Dysphagia Providers:            Manya Silvas, MD Referring MD:         Vickki Muff. Chrismon, MD (Referring MD) Medicines:            Propofol per Anesthesia Complications:        No immediate complications. Procedure:            Pre-Anesthesia Assessment:                       - After reviewing the risks and benefits, the patient                        was deemed in satisfactory condition to undergo the                        procedure.                       After obtaining informed consent, the endoscope was                        passed under direct vision. Throughout the procedure,                        the patient's blood pressure, pulse, and oxygen                        saturations were monitored continuously. The Endoscope                        was introduced through the mouth, and advanced to the                        second part of duodenum. The upper GI endoscopy was                        accomplished without difficulty. The patient tolerated                        the procedure well. Findings:      The examined esophagus was normal. GEJ 42cm. At the end of the procedure       A guidewire was placed and the scope was withdrawn. Dilation was       performed with a Savary dilator with mild resistance at 17 mm.      One non-bleeding cratered gastric ulcer with no stigmata of bleeding was       found in the gastric body and on the posterior wall of the stomach. The       lesion was 5 mm in largest dimension. Biopsies were taken with a cold       forceps for histology.      Diffuse and patchy mild inflammation characterized by erythema and   granularity was found in the gastric body. Biopsies  were taken with a       cold forceps for histology. Biopsies were taken with a cold forceps for       Helicobacter pylori testing.      The examined duodenum was normal. Impression:           - Normal esophagus. Dilated.                       - Non-bleeding gastric ulcer with no stigmata of                        bleeding. Biopsied.                       - Gastritis. Biopsied.                       - Normal examined duodenum. Recommendation:       - Await pathology results. Manya Silvas, MD 09/22/2016 7:52:45 AM This report has been signed electronically. Number of Addenda: 0 Note Initiated On: 09/22/2016 7:34 AM      [facility]

## 2016-09-23 ENCOUNTER — Encounter: Payer: Self-pay | Admitting: Unknown Physician Specialty

## 2016-09-28 ENCOUNTER — Ambulatory Visit (INDEPENDENT_AMBULATORY_CARE_PROVIDER_SITE_OTHER): Payer: Medicare Other | Admitting: *Deleted

## 2016-09-28 DIAGNOSIS — I639 Cerebral infarction, unspecified: Secondary | ICD-10-CM | POA: Diagnosis not present

## 2016-09-28 LAB — SURGICAL PATHOLOGY

## 2016-09-29 NOTE — Progress Notes (Signed)
Carelink Summary Report / Loop Recorder 

## 2016-10-03 LAB — CUP PACEART REMOTE DEVICE CHECK
Implantable Pulse Generator Implant Date: 20170627
MDC IDC SESS DTM: 20180821173719

## 2016-10-13 ENCOUNTER — Telehealth: Payer: Self-pay | Admitting: Cardiology

## 2016-10-13 NOTE — Telephone Encounter (Signed)
Spoke w/ pt and requested that she send a manual transmission b/c her home monitor has not updated in at least 14 days.   

## 2016-10-14 ENCOUNTER — Encounter: Payer: Self-pay | Admitting: Family Medicine

## 2016-10-15 ENCOUNTER — Encounter: Payer: Self-pay | Admitting: Family Medicine

## 2016-10-18 ENCOUNTER — Encounter: Payer: Self-pay | Admitting: Psychiatry

## 2016-10-18 ENCOUNTER — Ambulatory Visit (INDEPENDENT_AMBULATORY_CARE_PROVIDER_SITE_OTHER): Payer: Medicare Other | Admitting: Psychiatry

## 2016-10-18 VITALS — BP 129/76 | HR 76 | Temp 98.1°F | Wt 227.6 lb

## 2016-10-18 DIAGNOSIS — I639 Cerebral infarction, unspecified: Secondary | ICD-10-CM

## 2016-10-18 DIAGNOSIS — F331 Major depressive disorder, recurrent, moderate: Secondary | ICD-10-CM

## 2016-10-18 MED ORDER — CLONAZEPAM 0.5 MG PO TABS: 0.2500 mg | ORAL_TABLET | Freq: Every day | ORAL | 1 refills | Status: DC

## 2016-10-18 MED ORDER — PAROXETINE HCL 40 MG PO TABS: 40.0000 mg | ORAL_TABLET | Freq: Every day | ORAL | 1 refills | Status: DC

## 2016-10-18 NOTE — Progress Notes (Signed)
BH MD/PA/NP OP Progress Note  10/18/2016 10:37 AM Jocelyn Wells  MRN:  [id number]  Subjective:   Pt is  a 61 -year-old female who presented for follow-up appointment. Patient reported that she has been compliant with her medications. She went to a trip with her husband and left her cousin behind to look after her pets. She reported that she has issues with her cousin who was living with her in the past. Now she is trying to move in with them. Patient was discussing in detail about the family issues. She was upset with the behavior of her cousin. She does not want her to stay with them in their house. Patient reported that she feels that the Paxil is helping her and her mood is stable. She sleeps well at night. She does not take Klonopin on a regular basis. Her memory is  improving and she has occasional episodes of memory problems. She denied having any suicidal ideations or plans. Patient reported that she is preparing for the weather coming weekend. She denied having any perceptual disturbances. She appeared calm and alert during this examination.       Chief Complaint:  Chief Complaint    Follow-up; Medication Refill     Visit Diagnosis:     ICD-10-CM   1. MDD (major depressive disorder), recurrent episode, moderate ([place]) F33.1     Past Medical History:  Past Medical History:  Diagnosis Date  . Anxiety   . Cardiomyopathy [facility])    new to her Jan 2017  . COPD (chronic obstructive pulmonary disease) ([place])   . Depression   . Diabetes mellitus, type II ([place])   . HTN (hypertension)   . Obesity   . PONV (postoperative nausea and vomiting)   . Stroke [facility])    Jan 2017    Past Surgical History:  Procedure Laterality Date  . ABDOMINAL HYSTERECTOMY    . ANKLE FRACTURE SURGERY Left 2002  . BILATERAL SALPINGOOPHORECTOMY  2000  . CARDIAC CATHETERIZATION N/A 03/25/2015   Procedure: Right/Left Heart Cath and Coronary Angiography;  Surgeon: Belva Crome, MD;  Location: [place] CV  LAB;  Service: Cardiovascular;  Laterality: N/A;  . [place]  . COLONOSCOPY WITH PROPOFOL N/A 09/22/2016   Procedure: COLONOSCOPY WITH PROPOFOL;  Surgeon: Manya Silvas, MD;  Location: [facility] ENDOSCOPY;  Service: Endoscopy;  Laterality: N/A;  . EP IMPLANTABLE DEVICE N/A 08/05/2015   Procedure: Loop Recorder Insertion;  Surgeon: Deboraha Sprang, MD;  Location: [place] CV LAB;  Service: Cardiovascular;  Laterality: N/A;  . ESOPHAGOGASTRODUODENOSCOPY (EGD) WITH PROPOFOL N/A 09/22/2016   Procedure: ESOPHAGOGASTRODUODENOSCOPY (EGD) WITH PROPOFOL;  Surgeon: Manya Silvas, MD;  Location: [facility] ENDOSCOPY;  Service: Endoscopy;  Laterality: N/A;  . KNEE ARTHROSCOPY Left 2005  . TEE WITHOUT CARDIOVERSION N/A 01/31/2015   Procedure: TRANSESOPHAGEAL ECHOCARDIOGRAM (TEE);  Surgeon: Lelon Perla, MD;  Location: [place];  Service: Cardiovascular;  Laterality: N/A;  . [place]  . ULNAR NERVE TRANSPOSITION  2008   Family History:  Family History  Problem Relation Age of Onset  . Heart disease Mother        died from CHF  . Asthma Mother   . Diabetes Mother   . Heart disease Father   . Aneurysm Father   . Diabetes Brother   . Alcohol abuse Paternal Aunt   . Anemia Neg Hx   . Arrhythmia Neg Hx   . Clotting disorder Neg Hx   . Fainting  Neg Hx   . Heart attack Neg Hx   . Heart failure Neg Hx   . Hyperlipidemia Neg Hx   . Hypertension Neg Hx    Social History:  Social History   Social History  . Marital status: Married    Spouse name: N/A  . Number of children: N/A  . Years of education: N/A   Social History Main Topics  . Smoking status: Current Every Day Smoker    Packs/day: 1.00    Years: 35.00    Types: Cigarettes    Start date: 02/08/1974  . Smokeless tobacco: Never Used  . Alcohol use No  . Drug use: No  . Sexual activity: Yes    Birth control/ protection: None   Other Topics Concern  . None   Social History Narrative   Lives at home  with stepson and husband, dogs and cats   Caffeine  Drinks sweet tea.   Right handed.    Additional History:  Married x 18 years. Has 2 children and Her husband has 2 children as well Assessment:   Musculoskeletal: Strength & Muscle Tone: within normal limits Gait & Station: normal Patient leans: N/A  Psychiatric Specialty Exam: Medication Refill  Associated symptoms include coughing, nausea and vomiting. Pertinent negatives include no chills, headaches or rash.    Review of Systems  Constitutional: Negative for chills and weight loss.  HENT: Negative for nosebleeds and tinnitus.   Eyes: Negative for double vision and discharge.  Respiratory: Positive for cough.   Cardiovascular: Negative for orthopnea.  Gastrointestinal: Positive for nausea and vomiting. Negative for blood in stool and diarrhea.  Genitourinary: Negative for urgency.  Skin: Negative for rash.  Neurological: Positive for sensory change, speech change and focal weakness. Negative for tingling and headaches.  Endo/Heme/Allergies: Negative for environmental allergies.  Psychiatric/Behavioral: Positive for depression.  All other systems reviewed and are negative.   There were no vitals taken for this visit.There is no height or weight on file to calculate BMI.  General Appearance: Casual  Eye Contact:  Fair  Speech:  Slow  Volume:  Normal  Mood:  Anxious  Affect:  Congruent  Thought Process:  Coherent  Orientation:  Full (Time, Place, and Person)  Thought Content:  WDL  Suicidal Thoughts:  No  Homicidal Thoughts:  No  Memory:  Immediate;   Fair  Judgement:  Fair  Insight:  Fair  Psychomotor Activity:  Normal  Concentration:  Fair  Recall:  [employer] of Knowledge: Fair  Language: Fair  Akathisia:  No  Handed:  Right  AIMS (if indicated):  none  Assets:  Communication Skills Desire for Improvement Physical Health Social Support  ADL's:  Intact  Cognition: WNL  Sleep:  6-7   Is the patient at  risk to self?  No. Has the patient been a risk to self in the past 6 months?  No. Has the patient been a risk to self within the distant past?  No. Is the patient a risk to others?  No. Has the patient been a risk to others in the past 6 months?  No. Has the patient been a risk to others within the distant past?  No.  Current Medications: Current Outpatient Prescriptions  Medication Sig Dispense Refill  . Apremilast (OTEZLA) 30 MG TABS Take by mouth.    Marland Kitchen aspirin 325 MG tablet Take 1 tablet (325 mg total) by mouth daily. 30 tablet 0  . atorvastatin (LIPITOR) 10 MG tablet TAKE 1 TABLET (  10 MG TOTAL) BY MOUTH DAILY. 90 tablet 3  . B-D INS SYRINGE 0.5CC/31GX5/16 31G X 5/16" 0.5 ML MISC     . BYDUREON 2 MG SRER     . carvedilol (COREG) 12.5 MG tablet Take 1 tablet (12.5 mg total) by mouth 2 (two) times daily. 180 tablet 3  . clonazePAM (KLONOPIN) 0.5 MG tablet Take 0.5 tablets (0.25 mg total) by mouth at bedtime. Take one tablet (0.5 mg) by mouth once daily at bedtime as needed 15 tablet 1  . diclofenac (VOLTAREN) 75 MG EC tablet TAKE 1 TABLET BY MOUTH TWICE A DAY AS NEEDED 60 tablet 2  . fluticasone (FLONASE) 50 MCG/ACT nasal spray Place into both nostrils daily.    Marland Kitchen glyBURIDE-metformin (GLUCOVANCE) 5-500 MG per tablet Take 2 tablets by mouth 2 (two) times daily.     Marland Kitchen ketoconazole (NIZORAL) 2 % cream Apply 1 application topically daily.    Marland Kitchen loratadine (CLARITIN) 10 MG tablet Take 1 tablet by mouth daily.    Marland Kitchen losartan (COZAAR) 50 MG tablet TAKE 1 TABLET BY MOUTH EVERY DAY 30 tablet 1  . NOVOLIN 70/30 RELION (70-30) 100 UNIT/ML injection Inject 45 Units into the skin 2 (two) times daily with a meal.     . omeprazole (PRILOSEC) 40 MG capsule     . PARoxetine (PAXIL) 40 MG tablet Take 1 tablet (40 mg total) by mouth daily. 90 tablet 1  . spironolactone (ALDACTONE) 25 MG tablet Take 1/2 tablet (12.5 mg) by mouth once daily    . sucralfate (CARAFATE) 1 g tablet      No current  facility-administered medications for this visit.     Medical Decision Making:  Established Problem, Stable/Improving (1), Review of Psycho-Social Stressors (1) and Review of Last Therapy Session (1)  Treatment Plan Summary:Medication management   Depression and anxiety  Paxil 40 mg by mouth daily at bedtime to help with the depression.  She will continue on Klonopin when necessary basis  Follow-up Follow-up in 2 months or earlier depending on her symptoms   More than 50% of the time spent in psychoeducation, counseling and coordination of care.     This note was generated in part or whole with voice recognition software. Voice regonition is usually quite accurate but there are transcription errors that can and very often do occur. I apologize for any typographical errors that were not detected and corrected.   Rainey Pines, MD

## 2016-10-28 ENCOUNTER — Encounter: Payer: Self-pay | Admitting: Family Medicine

## 2016-10-28 ENCOUNTER — Ambulatory Visit (INDEPENDENT_AMBULATORY_CARE_PROVIDER_SITE_OTHER): Payer: Medicare Other | Admitting: *Deleted

## 2016-10-28 DIAGNOSIS — I639 Cerebral infarction, unspecified: Secondary | ICD-10-CM | POA: Diagnosis not present

## 2016-10-29 LAB — CUP PACEART REMOTE DEVICE CHECK
Date Time Interrogation Session: 20180920174031
MDC IDC PG IMPLANT DT: 20170627

## 2016-10-29 NOTE — Progress Notes (Signed)
Carelink Summary Report / Loop Recorder 

## 2016-11-03 DIAGNOSIS — K259 Gastric ulcer, unspecified as acute or chronic, without hemorrhage or perforation: Secondary | ICD-10-CM | POA: Diagnosis not present

## 2016-11-15 ENCOUNTER — Other Ambulatory Visit: Payer: Self-pay | Admitting: Family Medicine

## 2016-11-29 ENCOUNTER — Ambulatory Visit (INDEPENDENT_AMBULATORY_CARE_PROVIDER_SITE_OTHER): Payer: Medicare Other | Admitting: *Deleted

## 2016-11-29 DIAGNOSIS — I639 Cerebral infarction, unspecified: Secondary | ICD-10-CM

## 2016-11-29 NOTE — Progress Notes (Signed)
Carelink Summary Report / Loop Recorder 

## 2016-12-02 LAB — CUP PACEART REMOTE DEVICE CHECK
Date Time Interrogation Session: 20181020181024
Implantable Pulse Generator Implant Date: 20170627

## 2016-12-08 ENCOUNTER — Encounter
Admission: RE | Disposition: A | Discharge status: Home / Self Care | Payer: Self-pay | Source: Ambulatory Visit | Attending: Unknown Physician Specialty

## 2016-12-08 ENCOUNTER — Ambulatory Visit: Payer: Medicare Other | Admitting: Anesthesiology

## 2016-12-08 ENCOUNTER — Ambulatory Visit
Admission: RE | Admit: 2016-12-08 | Discharge: 2016-12-08 | Disposition: A | Discharge status: Home / Self Care | Payer: Medicare Other | Source: Ambulatory Visit | Attending: Unknown Physician Specialty | Admitting: Unknown Physician Specialty

## 2016-12-08 ENCOUNTER — Encounter: Payer: Self-pay | Admitting: *Deleted

## 2016-12-08 DIAGNOSIS — Z09 Encounter for follow-up examination after completed treatment for conditions other than malignant neoplasm: Secondary | ICD-10-CM | POA: Insufficient documentation

## 2016-12-08 DIAGNOSIS — M1990 Unspecified osteoarthritis, unspecified site: Secondary | ICD-10-CM | POA: Insufficient documentation

## 2016-12-08 DIAGNOSIS — I429 Cardiomyopathy, unspecified: Secondary | ICD-10-CM | POA: Diagnosis not present

## 2016-12-08 DIAGNOSIS — K259 Gastric ulcer, unspecified as acute or chronic, without hemorrhage or perforation: Secondary | ICD-10-CM | POA: Diagnosis not present

## 2016-12-08 DIAGNOSIS — Z8249 Family history of ischemic heart disease and other diseases of the circulatory system: Secondary | ICD-10-CM | POA: Insufficient documentation

## 2016-12-08 DIAGNOSIS — J449 Chronic obstructive pulmonary disease, unspecified: Secondary | ICD-10-CM | POA: Diagnosis not present

## 2016-12-08 DIAGNOSIS — Z8719 Personal history of other diseases of the digestive system: Secondary | ICD-10-CM | POA: Diagnosis not present

## 2016-12-08 DIAGNOSIS — Z8673 Personal history of transient ischemic attack (TIA), and cerebral infarction without residual deficits: Secondary | ICD-10-CM | POA: Insufficient documentation

## 2016-12-08 DIAGNOSIS — Z7982 Long term (current) use of aspirin: Secondary | ICD-10-CM | POA: Insufficient documentation

## 2016-12-08 DIAGNOSIS — F17210 Nicotine dependence, cigarettes, uncomplicated: Secondary | ICD-10-CM | POA: Insufficient documentation

## 2016-12-08 DIAGNOSIS — I10 Essential (primary) hypertension: Secondary | ICD-10-CM | POA: Diagnosis not present

## 2016-12-08 DIAGNOSIS — Z90710 Acquired absence of both cervix and uterus: Secondary | ICD-10-CM | POA: Insufficient documentation

## 2016-12-08 DIAGNOSIS — K253 Acute gastric ulcer without hemorrhage or perforation: Secondary | ICD-10-CM | POA: Diagnosis not present

## 2016-12-08 DIAGNOSIS — Z7984 Long term (current) use of oral hypoglycemic drugs: Secondary | ICD-10-CM | POA: Insufficient documentation

## 2016-12-08 DIAGNOSIS — F329 Major depressive disorder, single episode, unspecified: Secondary | ICD-10-CM | POA: Diagnosis not present

## 2016-12-08 DIAGNOSIS — E1151 Type 2 diabetes mellitus with diabetic peripheral angiopathy without gangrene: Secondary | ICD-10-CM | POA: Diagnosis not present

## 2016-12-08 DIAGNOSIS — Z6836 Body mass index (BMI) 36.0-36.9, adult: Secondary | ICD-10-CM | POA: Insufficient documentation

## 2016-12-08 DIAGNOSIS — E119 Type 2 diabetes mellitus without complications: Secondary | ICD-10-CM | POA: Diagnosis not present

## 2016-12-08 DIAGNOSIS — Z794 Long term (current) use of insulin: Secondary | ICD-10-CM | POA: Diagnosis not present

## 2016-12-08 DIAGNOSIS — Z885 Allergy status to narcotic agent status: Secondary | ICD-10-CM | POA: Diagnosis not present

## 2016-12-08 DIAGNOSIS — E669 Obesity, unspecified: Secondary | ICD-10-CM | POA: Insufficient documentation

## 2016-12-08 DIAGNOSIS — F419 Anxiety disorder, unspecified: Secondary | ICD-10-CM | POA: Insufficient documentation

## 2016-12-08 DIAGNOSIS — I739 Peripheral vascular disease, unspecified: Secondary | ICD-10-CM | POA: Diagnosis not present

## 2016-12-08 DIAGNOSIS — Z79899 Other long term (current) drug therapy: Secondary | ICD-10-CM | POA: Insufficient documentation

## 2016-12-08 HISTORY — PX: ESOPHAGOGASTRODUODENOSCOPY (EGD) WITH PROPOFOL: SHX5813

## 2016-12-08 LAB — GLUCOSE, CAPILLARY
GLUCOSE-CAPILLARY: 387 mg/dL — AB (ref 65–99)
GLUCOSE-CAPILLARY: 364 mg/dL — AB (ref 65–99)

## 2016-12-08 SURGERY — ESOPHAGOGASTRODUODENOSCOPY (EGD) WITH PROPOFOL
Anesthesia: General

## 2016-12-08 MED ORDER — IPRATROPIUM-ALBUTEROL 0.5-2.5 (3) MG/3ML IN SOLN
RESPIRATORY_TRACT | Status: AC
  Administered 2016-12-08: 3 mL
  Filled 2016-12-08: qty 3

## 2016-12-08 MED ORDER — MIDAZOLAM HCL 2 MG/2ML IJ SOLN
INTRAMUSCULAR | Status: AC
  Filled 2016-12-08: qty 2

## 2016-12-08 MED ORDER — IPRATROPIUM-ALBUTEROL 0.5-2.5 (3) MG/3ML IN SOLN
3.0000 mL | Freq: Once | RESPIRATORY_TRACT | Status: AC
  Administered 2016-12-08: 3 mL

## 2016-12-08 MED ORDER — INSULIN REGULAR HUMAN 100 UNIT/ML IJ SOLN: 10.0000 [IU] | Freq: Once | INTRAMUSCULAR | Status: DC

## 2016-12-08 MED ORDER — ONDANSETRON HCL 4 MG/2ML IJ SOLN
INTRAMUSCULAR | Status: DC | PRN
  Administered 2016-12-08: 4 mg via INTRAVENOUS

## 2016-12-08 MED ORDER — BUTAMBEN-TETRACAINE-BENZOCAINE 2-2-14 % EX AERO
INHALATION_SPRAY | CUTANEOUS | Status: DC | PRN
  Administered 2016-12-08: 2 via TOPICAL

## 2016-12-08 MED ORDER — FENTANYL CITRATE (PF) 100 MCG/2ML IJ SOLN
INTRAMUSCULAR | Status: DC | PRN
  Administered 2016-12-08 (×2): 25 ug via INTRAVENOUS
  Administered 2016-12-08: 50 ug via INTRAVENOUS

## 2016-12-08 MED ORDER — SODIUM CHLORIDE 0.9 % IV SOLN
INTRAVENOUS | Status: DC
  Administered 2016-12-08: 15:00:00 via INTRAVENOUS

## 2016-12-08 MED ORDER — LIDOCAINE HCL (CARDIAC) 20 MG/ML IV SOLN
INTRAVENOUS | Status: DC | PRN
  Administered 2016-12-08: 30 mg via INTRAVENOUS

## 2016-12-08 MED ORDER — LIDOCAINE HCL (PF) 2 % IJ SOLN
INTRAMUSCULAR | Status: AC
  Filled 2016-12-08: qty 10

## 2016-12-08 MED ORDER — FENTANYL CITRATE (PF) 100 MCG/2ML IJ SOLN
INTRAMUSCULAR | Status: AC
  Filled 2016-12-08: qty 2

## 2016-12-08 MED ORDER — PROPOFOL 500 MG/50ML IV EMUL
INTRAVENOUS | Status: DC | PRN
  Administered 2016-12-08: 120 ug/kg/min via INTRAVENOUS

## 2016-12-08 MED ORDER — ONDANSETRON HCL 4 MG/2ML IJ SOLN
INTRAMUSCULAR | Status: AC
  Filled 2016-12-08: qty 2

## 2016-12-08 MED ORDER — PROPOFOL 500 MG/50ML IV EMUL
INTRAVENOUS | Status: AC
  Filled 2016-12-08: qty 50

## 2016-12-08 MED ORDER — MIDAZOLAM HCL 2 MG/2ML IJ SOLN
INTRAMUSCULAR | Status: DC | PRN
  Administered 2016-12-08: 2 mg via INTRAVENOUS

## 2016-12-08 MED ORDER — INSULIN ASPART 100 UNIT/ML ~~LOC~~ SOLN
10.0000 [IU] | Freq: Once | SUBCUTANEOUS | Status: AC
  Administered 2016-12-08: 10 [IU] via SUBCUTANEOUS

## 2016-12-08 MED ORDER — INSULIN ASPART 100 UNIT/ML ~~LOC~~ SOLN
SUBCUTANEOUS | Status: AC
  Administered 2016-12-08: 10 [IU] via SUBCUTANEOUS
  Filled 2016-12-08: qty 1

## 2016-12-08 NOTE — Anesthesia Post-op Follow-up Note (Signed)
Anesthesia QCDR form completed.        

## 2016-12-08 NOTE — Anesthesia Procedure Notes (Signed)
Performed by: Vaughan Sine Pre-anesthesia Checklist: Patient identified, Emergency Drugs available, Suction available and Patient being monitored Patient Re-evaluated:Patient Re-evaluated prior to induction Oxygen Delivery Method: Nasal cannula Preoxygenation: Pre-oxygenation with 100% oxygen Induction Type: IV induction Placement Confirmation: positive ETCO2 and CO2 detector

## 2016-12-08 NOTE — Anesthesia Postprocedure Evaluation (Signed)
Anesthesia Post Note  Patient: Jocelyn Wells  Procedure(s) Performed: ESOPHAGOGASTRODUODENOSCOPY (EGD) WITH PROPOFOL (N/A )  Patient location during evaluation: Endoscopy Anesthesia Type: General Level of consciousness: awake and alert Pain management: pain level controlled Vital Signs Assessment: post-procedure vital signs reviewed and stable Respiratory status: spontaneous breathing and respiratory function stable Cardiovascular status: stable Anesthetic complications: no     Last Vitals:  Vitals:   12/08/16 1632 12/08/16 1634  BP:  (!) 108/53  Pulse: 70 71  Resp: (!) 22 19  Temp:    SpO2: 92% 92%    Last Pain:  Vitals:   12/08/16 1444  TempSrc: Tympanic                 Noeh Sparacino K

## 2016-12-08 NOTE — Op Note (Signed)
[facility] Gastroenterology Patient Name: Jocelyn Wells Procedure Date: 12/08/2016 3:41 PM MRN: [id number] Account #: [id number] Date of Birth: 1955-03-20 Admit Type: Outpatient Age: 61 Room: [facility] ENDO ROOM 3 Gender: Female Note Status: Finalized Procedure:            Upper GI endoscopy Indications:          Follow-up of acute gastric ulcer Providers:            Manya Silvas, MD Referring MD:         Vickki Muff. Chrismon, MD (Referring MD) Medicines:            Propofol per Anesthesia Complications:        No immediate complications. Procedure:            Pre-Anesthesia Assessment:                       - After reviewing the risks and benefits, the patient                        was deemed in satisfactory condition to undergo the                        procedure.                       After obtaining informed consent, the endoscope was                        passed under direct vision. Throughout the procedure,                        the patient's blood pressure, pulse, and oxygen                        saturations were monitored continuously. The Endoscope                        was introduced through the mouth, and advanced to the                        second part of duodenum. The upper GI endoscopy was                        accomplished without difficulty. The patient tolerated                        the procedure well. Findings:      The examined esophagus was normal. GEJ 40cm.      The entire examined stomach showed no ulcer. Minimal erythematous       streaks in the antrum.      The examined duodenum was normal. Impression:           - Normal esophagus.                       - Normal stomach.                       - Normal examined duodenum.                       - No specimens  collected. Recommendation:       - The findings and recommendations were discussed with                        the patient's family. Take ulcer medicine once a day to                    keep ulcer from coming back. Manya Silvas, MD 12/08/2016 3:56:32 PM This report has been signed electronically. Number of Addenda: 0 Note Initiated On: 12/08/2016 3:41 PM      [facility]

## 2016-12-08 NOTE — Transfer of Care (Signed)
Immediate Anesthesia Transfer of Care Note  Patient: Jocelyn Wells  Procedure(s) Performed: ESOPHAGOGASTRODUODENOSCOPY (EGD) WITH PROPOFOL (N/A )  Patient Location: PACU  Anesthesia Type:General  Level of Consciousness: awake and sedated  Airway & Oxygen Therapy: Patient Spontanous Breathing and Patient connected to nasal cannula oxygen  Post-op Assessment: Report given to RN and Post -op Vital signs reviewed and stable  Post vital signs: Reviewed and stable  Last Vitals:  Vitals:   12/08/16 1444  BP: 108/67  Pulse: 77  Resp: 20  Temp: (!) 36.1 C    Last Pain:  Vitals:   12/08/16 1444  TempSrc: Tympanic      Patients Stated Pain Goal: 0 (20/80/22 3361)  Complications: No apparent anesthesia complications

## 2016-12-08 NOTE — Anesthesia Preprocedure Evaluation (Addendum)
Anesthesia Evaluation  Patient identified by MRN, date of birth, ID band Patient awake    Reviewed: Allergy & Precautions, H&P , NPO status , Patient's Chart, lab work & pertinent test results  History of Anesthesia Complications (+) PONV and history of anesthetic complications  Airway Mallampati: III  TM Distance: <3 FB Neck ROM: limited    Dental  (+) Chipped, Missing, Edentulous Upper, Edentulous Lower   Pulmonary shortness of breath and with exertion, COPD, Current Smoker,           Cardiovascular Exercise Tolerance: Poor hypertension, + Peripheral Vascular Disease  (-) Past MI + dysrhythmias (w loop recorder)      Neuro/Psych  Headaches, PSYCHIATRIC DISORDERS Anxiety Depression CVA    GI/Hepatic negative GI ROS, Neg liver ROS,   Endo/Other  diabetes, Type 2  Renal/GU Renal disease  negative genitourinary   Musculoskeletal  (+) Arthritis ,   Abdominal   Peds  Hematology negative hematology ROS (+)   Anesthesia Other Findings Past Medical History: No date: Anxiety No date: Cardiomyopathy [facility])     Comment:  new to her Jan 2017 No date: COPD (chronic obstructive pulmonary disease) ([facility]) No date: Depression No date: Diabetes mellitus, type II ([place]) No date: HTN (hypertension) No date: Obesity No date: PONV (postoperative nausea and vomiting) No date: Stroke [facility])     Comment:  Jan 2017  Past Surgical History: No date: ABDOMINAL HYSTERECTOMY 2002: ANKLE FRACTURE SURGERY; Left 2000: BILATERAL SALPINGOOPHORECTOMY 03/25/2015: CARDIAC CATHETERIZATION; N/A     Comment:  Procedure: Right/Left Heart Cath and Coronary               Angiography;  Surgeon: Belva Crome, MD;  Location: [place] CV LAB;  Service: Cardiovascular;  Laterality:               N/A; 1978: CESAREAN SECTION 09/22/2016: COLONOSCOPY WITH PROPOFOL; N/A     Comment:  Procedure: COLONOSCOPY WITH PROPOFOL;  Surgeon: Manya Silvas, MD;  Location: [facility] ENDOSCOPY;  Service:               Endoscopy;  Laterality: N/A; 08/05/2015: EP IMPLANTABLE DEVICE; N/A     Comment:  Procedure: Loop Recorder Insertion;  Surgeon: Deboraha Sprang, MD;  Location: [place] CV LAB;  Service:               Cardiovascular;  Laterality: N/A; 09/22/2016: ESOPHAGOGASTRODUODENOSCOPY (EGD) WITH PROPOFOL; N/A     Comment:  Procedure: ESOPHAGOGASTRODUODENOSCOPY (EGD) WITH               PROPOFOL;  Surgeon: Manya Silvas, MD;  Location:               [facility] ENDOSCOPY;  Service: Endoscopy;  Laterality: N/A; 2005: KNEE ARTHROSCOPY; Left 01/31/2015: TEE WITHOUT CARDIOVERSION; N/A     Comment:  Procedure: TRANSESOPHAGEAL ECHOCARDIOGRAM (TEE);                Surgeon: Lelon Perla, MD;  Location: [facility] ENDOSCOPY;                Service: Cardiovascular;  Laterality: N/A; 1978: TUBAL LIGATION 2008: ULNAR NERVE TRANSPOSITION  BMI    Body Mass Index:  36.32 kg/m      Reproductive/Obstetrics negative OB  ROS                            Anesthesia Physical Anesthesia Plan  ASA: III  Anesthesia Plan: General   Post-op Pain Management:    Induction: Intravenous  PONV Risk Score and Plan: Propofol infusion  Airway Management Planned: Natural Airway and Nasal Cannula  Additional Equipment:   Intra-op Plan:   Post-operative Plan:   Informed Consent: I have reviewed the patients History and Physical, chart, labs and discussed the procedure including the risks, benefits and alternatives for the proposed anesthesia with the patient or authorized representative who has indicated his/her understanding and acceptance.   Dental Advisory Given  Plan Discussed with: Anesthesiologist, CRNA and Surgeon  Anesthesia Plan Comments: (Patient consented for risks of anesthesia including but not limited to:  - adverse reactions to medications - risk of intubation if required - damage to teeth,  lips or other oral mucosa - sore throat or hoarseness - Damage to heart, brain, lungs or loss of life  Patient voiced understanding.)        Anesthesia Quick Evaluation

## 2016-12-08 NOTE — H&P (Signed)
Primary Care Physician:  Margo Common, PA Primary Gastroenterologist:  Dr. Vira Agar  Pre-Procedure History & Physical: HPI:  Jocelyn Wells is a 61 y.o. female is here for an endoscopy.   Past Medical History:  Diagnosis Date  . Anxiety   . Cardiomyopathy [facility])    new to her Jan 2017  . COPD (chronic obstructive pulmonary disease) ([place])   . Depression   . Diabetes mellitus, type II ([place])   . HTN (hypertension)   . Obesity   . PONV (postoperative nausea and vomiting)   . Stroke [facility])    Jan 2017    Past Surgical History:  Procedure Laterality Date  . ABDOMINAL HYSTERECTOMY    . ANKLE FRACTURE SURGERY Left 2002  . BILATERAL SALPINGOOPHORECTOMY  2000  . CARDIAC CATHETERIZATION N/A 03/25/2015   Procedure: Right/Left Heart Cath and Coronary Angiography;  Surgeon: Belva Crome, MD;  Location: [place] CV LAB;  Service: Cardiovascular;  Laterality: N/A;  . [place]  . COLONOSCOPY WITH PROPOFOL N/A 09/22/2016   Procedure: COLONOSCOPY WITH PROPOFOL;  Surgeon: Manya Silvas, MD;  Location: [facility] ENDOSCOPY;  Service: Endoscopy;  Laterality: N/A;  . EP IMPLANTABLE DEVICE N/A 08/05/2015   Procedure: Loop Recorder Insertion;  Surgeon: Deboraha Sprang, MD;  Location: [place] CV LAB;  Service: Cardiovascular;  Laterality: N/A;  . ESOPHAGOGASTRODUODENOSCOPY (EGD) WITH PROPOFOL N/A 09/22/2016   Procedure: ESOPHAGOGASTRODUODENOSCOPY (EGD) WITH PROPOFOL;  Surgeon: Manya Silvas, MD;  Location: [facility] ENDOSCOPY;  Service: Endoscopy;  Laterality: N/A;  . KNEE ARTHROSCOPY Left 2005  . TEE WITHOUT CARDIOVERSION N/A 01/31/2015   Procedure: TRANSESOPHAGEAL ECHOCARDIOGRAM (TEE);  Surgeon: Lelon Perla, MD;  Location: [place];  Service: Cardiovascular;  Laterality: N/A;  . [place]  . ULNAR NERVE TRANSPOSITION  2008    Prior to Admission medications   Medication Sig Start Date End Date Taking? Authorizing Provider  Apremilast (OTEZLA) 30 MG TABS  Take by mouth.   Yes [provider]  aspirin 325 MG tablet Take 1 tablet (325 mg total) by mouth daily. 01/31/15  Yes Elgergawy, Silver Huguenin, MD  atorvastatin (LIPITOR) 10 MG tablet TAKE 1 TABLET (10 MG TOTAL) BY MOUTH DAILY. 05/10/16  Yes Chrismon, Vickki Muff, PA  B-D INS SYRINGE 0.5CC/31GX5/16 31G X 5/16" 0.5 ML MISC  02/23/16  Yes [provider]  BYDUREON 2 MG SRER  08/26/15  Yes [provider]  carvedilol (COREG) 12.5 MG tablet Take 1 tablet (12.5 mg total) by mouth 2 (two) times daily. 03/12/16  Yes Deboraha Sprang, MD  clonazePAM (KLONOPIN) 0.5 MG tablet Take 0.5 tablets (0.25 mg total) by mouth at bedtime. Take one tablet (0.5 mg) by mouth once daily at bedtime as needed 10/18/16  Yes Rainey Pines, MD  diclofenac (VOLTAREN) 75 MG EC tablet TAKE 1 TABLET BY MOUTH TWICE A DAY AS NEEDED 09/16/16  Yes Chrismon, Vickki Muff, PA  glyBURIDE-metformin (GLUCOVANCE) 5-500 MG per tablet Take 2 tablets by mouth 2 (two) times daily.    Yes [provider]  loratadine (CLARITIN) 10 MG tablet Take 1 tablet by mouth daily.   Yes [provider]  losartan (COZAAR) 50 MG tablet TAKE 1 TABLET BY MOUTH EVERY DAY 11/15/16  Yes Chrismon, Vickki Muff, PA  NOVOLIN 70/30 RELION (70-30) 100 UNIT/ML injection Inject 45 Units into the skin 2 (two) times daily with a meal.  10/06/15  Yes [provider]  omeprazole (PRILOSEC) 40 MG capsule  09/23/16  Yes [provider]  PARoxetine (PAXIL) 40 MG tablet Take 1 tablet (40 mg total) by mouth daily. 10/18/16  Yes Rainey Pines, MD  spironolactone (ALDACTONE) 25 MG tablet Take 1/2 tablet (12.5 mg) by mouth once daily   Yes [provider]  sucralfate (CARAFATE) 1 g tablet  09/23/16  Yes [provider]  fluticasone (FLONASE) 50 MCG/ACT nasal spray Place into both nostrils daily.    [provider]  ketoconazole (NIZORAL) 2 % cream Apply 1 application topically daily.    [provider]    Allergies  as of 11/22/2016 - Review Complete 10/18/2016  Allergen Reaction Noted  . Codeine Nausea And Vomiting 07/05/2014    Family History  Problem Relation Age of Onset  . Heart disease Mother        died from CHF  . Asthma Mother   . Diabetes Mother   . Heart disease Father   . Aneurysm Father   . Diabetes Brother   . Alcohol abuse Paternal Aunt   . Anemia Neg Hx   . Arrhythmia Neg Hx   . Clotting disorder Neg Hx   . Fainting Neg Hx   . Heart attack Neg Hx   . Heart failure Neg Hx   . Hyperlipidemia Neg Hx   . Hypertension Neg Hx     Social History   Social History  . Marital status: Married    Spouse name: N/A  . Number of children: N/A  . Years of education: N/A   Occupational History  . Not on file.   Social History Main Topics  . Smoking status: Current Every Day Smoker    Packs/day: 1.00    Years: 35.00    Types: Cigarettes    Start date: 02/08/1974  . Smokeless tobacco: Never Used  . Alcohol use No  . Drug use: No  . Sexual activity: Yes    Birth control/ protection: None   Other Topics Concern  . Not on file   Social History Narrative   Lives at home with stepson and husband, dogs and cats   Caffeine  Drinks sweet tea.   Right handed.     Review of Systems: See HPI, otherwise negative ROS  Physical Exam: BP 108/67   Pulse 77   Temp (!) 97 F (36.1 C) (Tympanic)   Resp 20   Ht 5\' 6"  (1.676 m)   Wt 102.1 kg (225 lb)   BMI 36.32 kg/m  General:   Alert,  pleasant and cooperative in NAD Head:  Normocephalic and atraumatic. Neck:  Supple; no masses or thyromegaly. Lungs:  Clear throughout to auscultation.    Heart:  Regular rate and rhythm. Abdomen:  Soft, nontender and nondistended. Normal bowel sounds, without guarding, and without rebound.   Neurologic:  Alert and  oriented x4;  grossly normal neurologically.  Impression/Plan: Jocelyn Wells is here for an endoscopy to be performed for follow up gastric ulcer.  Risks, benefits, limitations,  and alternatives regarding  endoscopy have been reviewed with the patient.  Questions have been answered.  All parties agreeable.   Gaylyn Cheers, MD  12/08/2016, 3:38 PM

## 2016-12-10 ENCOUNTER — Encounter: Payer: Self-pay | Admitting: Unknown Physician Specialty

## 2016-12-13 ENCOUNTER — Encounter: Payer: Self-pay | Admitting: Psychiatry

## 2016-12-13 ENCOUNTER — Other Ambulatory Visit: Payer: Self-pay

## 2016-12-13 ENCOUNTER — Ambulatory Visit (INDEPENDENT_AMBULATORY_CARE_PROVIDER_SITE_OTHER): Payer: Medicare Other | Admitting: Psychiatry

## 2016-12-13 VITALS — BP 127/69 | HR 78 | Temp 97.8°F | Wt 231.8 lb

## 2016-12-13 DIAGNOSIS — F331 Major depressive disorder, recurrent, moderate: Secondary | ICD-10-CM

## 2016-12-13 DIAGNOSIS — I639 Cerebral infarction, unspecified: Secondary | ICD-10-CM | POA: Diagnosis not present

## 2016-12-13 MED ORDER — PAROXETINE HCL 40 MG PO TABS: 40.0000 mg | ORAL_TABLET | Freq: Every day | ORAL | 1 refills | Status: DC
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
## 2016-12-16 ENCOUNTER — Other Ambulatory Visit: Payer: Self-pay | Admitting: Family Medicine

## 2016-12-20 NOTE — Progress Notes (Signed)
Acknowledged.

## 2016-12-27 ENCOUNTER — Ambulatory Visit (INDEPENDENT_AMBULATORY_CARE_PROVIDER_SITE_OTHER): Payer: Medicare Other | Admitting: *Deleted

## 2016-12-27 DIAGNOSIS — I639 Cerebral infarction, unspecified: Secondary | ICD-10-CM | POA: Diagnosis not present

## 2016-12-28 NOTE — Progress Notes (Signed)
Carelink Summary Report / Loop Recorder 

## 2017-01-11 ENCOUNTER — Encounter: Payer: Self-pay | Admitting: Pediatrics

## 2017-01-13 LAB — CUP PACEART REMOTE DEVICE CHECK
Date Time Interrogation Session: 20181119184649
Implantable Pulse Generator Implant Date: 20170627

## 2017-01-22 ENCOUNTER — Other Ambulatory Visit: Payer: Self-pay | Admitting: Family Medicine

## 2017-01-26 ENCOUNTER — Ambulatory Visit (INDEPENDENT_AMBULATORY_CARE_PROVIDER_SITE_OTHER): Payer: Medicare Other | Admitting: *Deleted

## 2017-01-26 DIAGNOSIS — I639 Cerebral infarction, unspecified: Secondary | ICD-10-CM | POA: Diagnosis not present

## 2017-01-27 NOTE — Progress Notes (Signed)
Carelink Summary Report / Loop Recorder 

## 2017-02-07 DIAGNOSIS — W19XXXA Unspecified fall, initial encounter: Secondary | ICD-10-CM | POA: Diagnosis not present

## 2017-02-07 DIAGNOSIS — S2231XA Fracture of one rib, right side, initial encounter for closed fracture: Secondary | ICD-10-CM | POA: Diagnosis not present

## 2017-02-07 DIAGNOSIS — R0789 Other chest pain: Secondary | ICD-10-CM | POA: Diagnosis not present

## 2017-02-07 DIAGNOSIS — Y92009 Unspecified place in unspecified non-institutional (private) residence as the place of occurrence of the external cause: Secondary | ICD-10-CM | POA: Diagnosis not present

## 2017-02-09 LAB — CUP PACEART REMOTE DEVICE CHECK
Implantable Pulse Generator Implant Date: 20170627
MDC IDC SESS DTM: 20181219193940

## 2017-02-25 ENCOUNTER — Ambulatory Visit (INDEPENDENT_AMBULATORY_CARE_PROVIDER_SITE_OTHER): Payer: Medicare Other | Admitting: *Deleted

## 2017-02-25 DIAGNOSIS — I639 Cerebral infarction, unspecified: Secondary | ICD-10-CM | POA: Diagnosis not present

## 2017-02-28 ENCOUNTER — Ambulatory Visit: Payer: Medicare Other | Admitting: Psychiatry

## 2017-02-28 NOTE — Progress Notes (Signed)
Carelink Summary Report / Loop Recorder 

## 2017-03-01 LAB — CUP PACEART REMOTE DEVICE CHECK
Implantable Pulse Generator Implant Date: 20170627
MDC IDC SESS DTM: 20190118204109

## 2017-03-02 DIAGNOSIS — Z794 Long term (current) use of insulin: Secondary | ICD-10-CM | POA: Diagnosis not present

## 2017-03-02 DIAGNOSIS — E1165 Type 2 diabetes mellitus with hyperglycemia: Secondary | ICD-10-CM | POA: Diagnosis not present

## 2017-03-02 DIAGNOSIS — E1122 Type 2 diabetes mellitus with diabetic chronic kidney disease: Secondary | ICD-10-CM | POA: Diagnosis not present

## 2017-03-02 DIAGNOSIS — N183 Chronic kidney disease, stage 3 (moderate): Secondary | ICD-10-CM | POA: Diagnosis not present

## 2017-03-02 LAB — HEMOGLOBIN A1C: HEMOGLOBIN A1C: 15.1

## 2017-03-06 NOTE — Progress Notes (Signed)
Acknowledged. Thanks

## 2017-03-08 DIAGNOSIS — E1165 Type 2 diabetes mellitus with hyperglycemia: Secondary | ICD-10-CM | POA: Diagnosis not present

## 2017-03-08 DIAGNOSIS — N183 Chronic kidney disease, stage 3 (moderate): Secondary | ICD-10-CM | POA: Diagnosis not present

## 2017-03-08 DIAGNOSIS — E113393 Type 2 diabetes mellitus with moderate nonproliferative diabetic retinopathy without macular edema, bilateral: Secondary | ICD-10-CM | POA: Diagnosis not present

## 2017-03-08 DIAGNOSIS — R809 Proteinuria, unspecified: Secondary | ICD-10-CM | POA: Diagnosis not present

## 2017-03-08 DIAGNOSIS — E1129 Type 2 diabetes mellitus with other diabetic kidney complication: Secondary | ICD-10-CM | POA: Diagnosis not present

## 2017-03-08 DIAGNOSIS — Z794 Long term (current) use of insulin: Secondary | ICD-10-CM | POA: Diagnosis not present

## 2017-03-08 DIAGNOSIS — E1122 Type 2 diabetes mellitus with diabetic chronic kidney disease: Secondary | ICD-10-CM | POA: Diagnosis not present

## 2017-03-08 DIAGNOSIS — Z8673 Personal history of transient ischemic attack (TIA), and cerebral infarction without residual deficits: Secondary | ICD-10-CM | POA: Diagnosis not present

## 2017-03-08 DIAGNOSIS — F17200 Nicotine dependence, unspecified, uncomplicated: Secondary | ICD-10-CM | POA: Diagnosis not present

## 2017-03-14 ENCOUNTER — Ambulatory Visit: Payer: Self-pay

## 2017-03-14 DIAGNOSIS — E113213 Type 2 diabetes mellitus with mild nonproliferative diabetic retinopathy with macular edema, bilateral: Secondary | ICD-10-CM | POA: Diagnosis not present

## 2017-03-14 LAB — HM DIABETES EYE EXAM

## 2017-03-17 ENCOUNTER — Ambulatory Visit (INDEPENDENT_AMBULATORY_CARE_PROVIDER_SITE_OTHER): Payer: Medicare Other

## 2017-03-17 VITALS — BP 132/78 | HR 68 | Temp 98.7°F | Ht 66.0 in | Wt 231.2 lb

## 2017-03-17 DIAGNOSIS — Z0000 Encounter for general adult medical examination without abnormal findings: Secondary | ICD-10-CM

## 2017-03-17 NOTE — Progress Notes (Signed)
Subjective:   Jocelyn Wells is a 62 y.o. female who presents for Medicare Annual (Subsequent) preventive examination.  Review of Systems:  N/A   Cardiac Risk Factors include: advanced age (>46men, >72 women);diabetes mellitus;dyslipidemia;smoking/ tobacco exposure;hypertension;obesity (BMI >30kg/m2)     Objective:     Vitals: BP 132/78 (BP Location: Right Arm)   Pulse 68   Temp 98.7 F (37.1 C) (Oral)   Ht 5\' 6"  (1.676 m)   Wt 231 lb 3.2 oz (104.9 kg)   BMI 37.32 kg/m   Body mass index is 37.32 kg/m.  Advanced Directives 03/17/2017 12/08/2016 09/22/2016 03/11/2016 03/25/2015 01/31/2015  Does Patient Have a Medical Advance Directive? No No No No No No  Does patient want to make changes to medical advance directive? - - - Yes (ED - Information included in AVS) - -  Would patient like information on creating a medical advance directive? Yes (MAU/Ambulatory/Procedural Areas - Information given) No - Patient declined - - Yes - Educational materials given No - patient declined information  Some encounter information is confidential and restricted. Go to Review Flowsheets activity to see all data.    Tobacco Social History   Tobacco Use  Smoking Status Current Every Day Smoker  . Packs/day: 1.00  . Years: 35.00  . Pack years: 35.00  . Types: Cigarettes  . Start date: 02/08/1974  Smokeless Tobacco Never Used     Ready to quit: Yes Counseling given: Yes   Clinical Intake:  Pre-visit preparation completed: Yes  Pain : No/denies pain Pain Score: 0-No pain     Nutritional Status: BMI > 30  Obese Nutritional Risks: None Diabetes: Yes(type 2) CBG done?: No Did pt. bring in CBG monitor from home?: No  How often do you need to have someone help you when you read instructions, pamphlets, or other written materials from your doctor or pharmacy?: 3 - Sometimes  Interpreter Needed?: No  Information entered by :: [facility], LPN  Past Medical History:  Diagnosis Date  .  Anxiety   . Cardiomyopathy [facility])    new to her Jan 2017  . COPD (chronic obstructive pulmonary disease) ([place])   . Depression   . Diabetes mellitus, type II ([place])   . HTN (hypertension)   . Obesity   . PONV (postoperative nausea and vomiting)   . Stroke [facility])    Jan 2017   Past Surgical History:  Procedure Laterality Date  . ABDOMINAL HYSTERECTOMY    . ANKLE FRACTURE SURGERY Left 2002  . BILATERAL SALPINGOOPHORECTOMY  2000  . CARDIAC CATHETERIZATION N/A 03/25/2015   Procedure: Right/Left Heart Cath and Coronary Angiography;  Surgeon: Belva Crome, MD;  Location: [place] CV LAB;  Service: Cardiovascular;  Laterality: N/A;  . [place]  . COLONOSCOPY WITH PROPOFOL N/A 09/22/2016   Procedure: COLONOSCOPY WITH PROPOFOL;  Surgeon: Manya Silvas, MD;  Location[facility] ENDOSCOPY;  Service: Endoscopy;  Laterality: N/A;  . EP IMPLANTABLE DEVICE N/A 08/05/2015   Procedure: Loop Recorder Insertion;  Surgeon: Deboraha Sprang, MD;  Location: [place] CV LAB;  Service: Cardiovascular;  Laterality: N/A;  . ESOPHAGOGASTRODUODENOSCOPY (EGD) WITH PROPOFOL N/A 09/22/2016   Procedure: ESOPHAGOGASTRODUODENOSCOPY (EGD) WITH PROPOFOL;  Surgeon: Manya Silvas, MD;  Location: [facility] ENDOSCOPY;  Service: Endoscopy;  Laterality: N/A;  . ESOPHAGOGASTRODUODENOSCOPY (EGD) WITH PROPOFOL N/A 12/08/2016   Procedure: ESOPHAGOGASTRODUODENOSCOPY (EGD) WITH PROPOFOL;  Surgeon: Manya Silvas, MD;  Location: [facility] ENDOSCOPY;  Service: Endoscopy;  Laterality: N/A;  . KNEE  ARTHROSCOPY Left 2005  . TEE WITHOUT CARDIOVERSION N/A 01/31/2015   Procedure: TRANSESOPHAGEAL ECHOCARDIOGRAM (TEE);  Surgeon: Lelon Perla, MD;  Location: [place];  Service: Cardiovascular;  Laterality: N/A;  . [place]  . ULNAR NERVE TRANSPOSITION  2008   Family History  Problem Relation Age of Onset  . Heart disease Mother        died from CHF  . Asthma Mother   . Diabetes Mother   . Heart disease  Father   . Aneurysm Father   . COPD Brother   . Diabetes Brother   . Alcohol abuse Paternal Aunt   . Anemia Neg Hx   . Arrhythmia Neg Hx   . Clotting disorder Neg Hx   . Fainting Neg Hx   . Heart attack Neg Hx   . Heart failure Neg Hx   . Hyperlipidemia Neg Hx   . Hypertension Neg Hx    Social History   Socioeconomic History  . Marital status: Married    Spouse name: None  . Number of children: 2  . Years of education: None  . Highest education level: Bachelor's degree (e.g., BA, AB, BS)  Social Needs  . Financial resource strain: Not hard at all  . Food insecurity - worry: Never true  . Food insecurity - inability: Never true  . Transportation needs - medical: No  . Transportation needs - non-medical: No  Occupational History  . Occupation: disabled    Comment: retired  Tobacco Use  . Smoking status: Current Every Day Smoker    Packs/day: 1.00    Years: 35.00    Pack years: 35.00    Types: Cigarettes    Start date: 02/08/1974  . Smokeless tobacco: Never Used  Substance and Sexual Activity  . Alcohol use: No    Alcohol/week: 0.0 oz  . Drug use: No  . Sexual activity: Yes    Birth control/protection: None  Other Topics Concern  . None  Social History Narrative   Lives at home with stepson and husband, dogs and cats   Caffeine  Drinks sweet tea.   Right handed.     Outpatient Encounter Medications as of 03/17/2017  Medication Sig  . aspirin 325 MG tablet Take 1 tablet (325 mg total) by mouth daily.  Marland Kitchen atorvastatin (LIPITOR) 10 MG tablet TAKE 1 TABLET (10 MG TOTAL) BY MOUTH DAILY.  Marland Kitchen B-D INS SYRINGE 0.5CC/31GX5/16 31G X 5/16" 0.5 ML MISC   . BYDUREON 2 MG SRER   . carvedilol (COREG) 12.5 MG tablet Take 1 tablet (12.5 mg total) by mouth 2 (two) times daily.  . diclofenac (VOLTAREN) 75 MG EC tablet TAKE 1 TABLET BY MOUTH TWICE A DAY AS NEEDED  . glyBURIDE-metformin (GLUCOVANCE) 5-500 MG per tablet Take 2 tablets by mouth 2 (two) times daily.   Marland Kitchen loratadine  (CLARITIN) 10 MG tablet Take 1 tablet by mouth daily.  Marland Kitchen losartan (COZAAR) 50 MG tablet TAKE 1 TABLET BY MOUTH EVERY DAY  . NOVOLIN 70/30 RELION (70-30) 100 UNIT/ML injection Inject 45 Units into the skin 2 (two) times daily with a meal.   . omeprazole (PRILOSEC) 40 MG capsule Take 40 mg by mouth daily.   Marland Kitchen PARoxetine (PAXIL) 40 MG tablet Take 1 tablet (40 mg total) daily by mouth.  . spironolactone (ALDACTONE) 25 MG tablet Take 1/2 tablet (12.5 mg) by mouth once daily  . [DISCONTINUED] Apremilast (OTEZLA) 30 MG TABS Take by mouth.  . [DISCONTINUED] fluticasone (FLONASE) 50 MCG/ACT  nasal spray Place into both nostrils daily.  . [DISCONTINUED] ketoconazole (NIZORAL) 2 % cream Apply 1 application topically daily.   No facility-administered encounter medications on file as of 03/17/2017.     Activities of Daily Living In your present state of health, do you have any difficulty performing the following activities: 03/17/2017  Hearing? Y  Comment Occasionally, does not feel she needs to see an audiologist yet.   Vision? Y  Comment Currently seeing a retina specialist to monitor glaucoma. Unable to see at night.   Difficulty concentrating or making decisions? Y  Walking or climbing stairs? Y  Comment Due to arthritis pain.  Dressing or bathing? N  Doing errands, shopping? N  Preparing Food and eating ? N  Using the Toilet? N  In the past six months, have you accidently leaked urine? Y  Comment Occasionally with laughing or coughing. Not at the point of wearing protection.  Do you have problems with loss of bowel control? N  Managing your Medications? N  Managing your Finances? N  Housekeeping or managing your Housekeeping? N  Some recent data might be hidden    Patient Care Team: Chrismon, Vickki Muff, PA as PCP - General (Physician Assistant) Gabriel Carina Betsey Holiday, MD as Physician Assistant (Endocrinology) Deboraha Sprang, MD as Consulting Physician (Cardiology) Rainey Pines, MD as Referring  Physician (Psychiatry) Ralene Bathe, MD as Consulting Physician (Dermatology) Manya Silvas, MD as Consulting Physician (Gastroenterology)    Assessment:   This is a routine wellness examination for [facility].  Exercise Activities and Dietary recommendations Current Exercise Habits: The patient does not participate in regular exercise at present, Exercise limited by: Other - see comments(Due to arthritis pains.)  Goals    None      Fall Risk Fall Risk  03/17/2017 06/16/2016 03/11/2016 04/24/2015 03/19/2015  Falls in the past year? Yes Yes Yes Yes Yes  Number falls in past yr: 1 1 1  - 2 or more  Comment tripped over dog - - - -  Injury with Fall? Yes Yes No - No  Comment broken rib leg/knee injury - - -  Risk Factor Category  - - - - High Fall Risk  Risk for fall due to : - Other (Comment) - - Medication side effect;Impaired balance/gait  Risk for fall due to: Comment - fell over dog steps at end of her bed - - -  Follow up Falls prevention discussed - Falls prevention discussed - Education provided;Falls prevention discussed   Is the patient's home free of loose throw rugs in walkways, pet beds, electrical cords, etc?   yes      Grab bars in the bathroom? no      Handrails on the stairs?   yes      Adequate lighting?   yes  Timed Get Up and Go performed: N/A  Depression Screen PHQ 2/9 Scores 03/17/2017 03/11/2016 04/24/2015 03/19/2015  PHQ - 2 Score 2 1 1  0  PHQ- 9 Score 9 - - -     Cognitive Function     6CIT Screen 03/17/2017 03/11/2016  What Year? 0 points 0 points  What month? 0 points 0 points  What time? 0 points 0 points  Count back from 20 0 points 0 points  Months in reverse 0 points 0 points  Repeat phrase 0 points 0 points  Total Score 0 0    Immunization History  Administered Date(s) Administered  . Influenza Split 01/05/2006, 10/31/2007, 03/03/2009, 12/16/2009, 12/23/2010, 12/24/2011, 12/19/2013  .  Influenza,inj,Quad PF,6+ Mos 11/23/2012, 11/07/2015  .  Influenza-Unspecified 11/07/2015, 11/03/2016  . Pneumococcal Polysaccharide-23 12/23/2010  . Zoster 12/11/2016    Qualifies for Shingles Vaccine? No, up to date on Shingrix. Received at [facility].   Screening Tests Health Maintenance  Topic Date Due  . TETANUS/TDAP  02/27/1973  . MAMMOGRAM  02/28/2004  . PNEUMOCOCCAL POLYSACCHARIDE VACCINE (2) 02/28/2019 (Originally 12/23/2015)  . PAP SMEAR  02/08/2026 (Originally 02/28/1975)  . HEMOGLOBIN A1C  08/30/2017  . OPHTHALMOLOGY EXAM  09/06/2017  . FOOT EXAM  03/08/2018  . COLONOSCOPY  09/23/2026  . INFLUENZA VACCINE  Completed  . Hepatitis C Screening  Completed  . HIV Screening  Completed    Cancer Screenings: Lung: Low Dose CT Chest recommended if Age 59-80 years, 30 pack-year currently smoking OR have quit w/in 15years. Patient does qualify. An Epic message has been sent to Burgess Estelle, RN (Oncology Nurse Navigator) regarding the possible need for this exam. Raquel Sarna will review the patient's chart to determine if the patient truly qualifies for the exam. If the patient qualifies, Raquel Sarna will order the Low Dose CT of the chest to facilitate the scheduling of this exam.  Breast:  Up to date on Mammogram? No, order on file. Pt to have this completed this year.  Up to date of Bone Density/Dexa? N/A Colorectal: Up to date  Additional Screenings:  Hepatitis B/HIV/Syphillis: Up to date on HIV, pt declined the other labs. Hepatitis C Screening: Up to date     Plan:  I have personally reviewed and addressed the Medicare Annual Wellness questionnaire and have noted the following in the patient's chart:  A. Medical and social history B. Use of alcohol, tobacco or illicit drugs  C. Current medications and supplements D. Functional ability and status E.  Nutritional status F.  Physical activity G. Advance directives H. List of other physicians I.  Hospitalizations, surgeries, and ER visits in previous 12 months J.   [place] such as hearing and vision if needed, cognitive and depression L. Referrals and appointments - none  In addition, I have reviewed and discussed with patient certain preventive protocols, quality metrics, and best practice recommendations. A written personalized care plan for preventive services as well as general preventive health recommendations were provided to patient.  See attached scanned questionnaire for additional information.   Signed,  Fabio Neighbors, LPN Nurse Health Advisor   Nurse Recommendations: Pt declined the tetanus vaccine today. Pt advised mammogram order is still on file. Pt to set this up this year. Awaiting response from [facility] regarding order for low dose CT scan.   Reviewed Nurse Health Advisor's documentation note and recommendations. Available for consultation and agree with recommendations/plans.

## 2017-03-17 NOTE — Patient Instructions (Signed)
Jocelyn Wells , Thank you for taking time to come for your Medicare Wellness Visit. I appreciate your ongoing commitment to your health goals. Please review the following plan we discussed and let me know if I can assist you in the future.   Screening recommendations/referrals: Colonoscopy: Up to date Recommended yearly ophthalmology/optometry visit for glaucoma screening and checkup Recommended yearly dental visit for hygiene and checkup  Vaccinations: Influenza vaccine: Up to date Pneumococcal vaccine: N/A Tdap vaccine: Pt declines today.  Shingles vaccine: Up to date    Advanced directives: Advance directive discussed with you today. I have provided a copy for you to complete at home and have notarized. Once this is complete please bring a copy in to our office so we can scan it into your chart.  Conditions/risks identified: Smoking cessation; Fall risk prevention; Obesity- recommend eating 3 small meals a day with 2 healthy snack in between to aid in weight loss.   Next appointment: 03/24/17 @ 4:00 PM  Preventive Care 65 Years and Older, Female Preventive care refers to lifestyle choices and visits with your health care provider that can promote health and wellness. What does preventive care include?  A yearly physical exam. This is also called an annual well check.  Dental exams once or twice a year.  Routine eye exams. Ask your health care provider how often you should have your eyes checked.  Personal lifestyle choices, including:  Daily care of your teeth and gums.  Regular physical activity.  Eating a healthy diet.  Avoiding tobacco and drug use.  Limiting alcohol use.  Practicing safe sex.  Taking low doses of aspirin every day.  Taking vitamin and mineral supplements as recommended by your health care provider. What happens during an annual well check? The services and screenings done by your health care provider during your annual well check will depend on your  age, overall health, lifestyle risk factors, and family history of disease. Counseling  Your health care provider may ask you questions about your:  Alcohol use.  Tobacco use.  Drug use.  Emotional well-being.  Home and relationship well-being.  Sexual activity.  Eating habits.  History of falls.  Memory and ability to understand (cognition).  Work and work Statistician. Screening  You may have the following tests or measurements:  Height, weight, and BMI.  Blood pressure.  Lipid and cholesterol levels. These may be checked every 5 years, or more frequently if you are over 86 years old.  Skin check.  Lung cancer screening. You may have this screening every year starting at age 24 if you have a 30-pack-year history of smoking and currently smoke or have quit within the past 15 years.  Fecal occult blood test (FOBT) of the stool. You may have this test every year starting at age 18.  Flexible sigmoidoscopy or colonoscopy. You may have a sigmoidoscopy every 5 years or a colonoscopy every 10 years starting at age 87.  Prostate cancer screening. Recommendations will vary depending on your family history and other risks.  Hepatitis C blood test.  Hepatitis B blood test.  Sexually transmitted disease (STD) testing.  Diabetes screening. This is done by checking your blood sugar (glucose) after you have not eaten for a while (fasting). You may have this done every 1-3 years.  Abdominal aortic aneurysm (AAA) screening. You may need this if you are a current or former smoker.  Osteoporosis. You may be screened starting at age 58 if you are at high risk. Talk  with your health care provider about your test results, treatment options, and if necessary, the need for more tests. Vaccines  Your health care provider may recommend certain vaccines, such as:  Influenza vaccine. This is recommended every year.  Tetanus, diphtheria, and acellular pertussis (Tdap, Td) vaccine. You  may need a Td booster every 10 years.  Zoster vaccine. You may need this after age 28.  Pneumococcal 13-valent conjugate (PCV13) vaccine. One dose is recommended after age 63.  Pneumococcal polysaccharide (PPSV23) vaccine. One dose is recommended after age 36. Talk to your health care provider about which screenings and vaccines you need and how often you need them. This information is not intended to replace advice given to you by your health care provider. Make sure you discuss any questions you have with your health care provider. Document Released: 02/21/2015 Document Revised: 10/15/2015 Document Reviewed: 11/26/2014 Elsevier Interactive Patient Education  2017 [place] Prevention in the Home Falls can cause injuries. They can happen to people of all ages. There are many things you can do to make your home safe and to help prevent falls. What can I do on the outside of my home?  Regularly fix the edges of walkways and driveways and fix any cracks.  Remove anything that might make you trip as you walk through a door, such as a raised step or threshold.  Trim any bushes or trees on the path to your home.  Use bright outdoor lighting.  Clear any walking paths of anything that might make someone trip, such as rocks or tools.  Regularly check to see if handrails are loose or broken. Make sure that both sides of any steps have handrails.  Any raised decks and porches should have guardrails on the edges.  Have any leaves, snow, or ice cleared regularly.  Use sand or salt on walking paths during winter.  Clean up any spills in your garage right away. This includes oil or grease spills. What can I do in the bathroom?  Use night lights.  Install grab bars by the toilet and in the tub and shower. Do not use towel bars as grab bars.  Use non-skid mats or decals in the tub or shower.  If you need to sit down in the shower, use a plastic, non-slip stool.  Keep the floor  dry. Clean up any water that spills on the floor as soon as it happens.  Remove soap buildup in the tub or shower regularly.  Attach bath mats securely with double-sided non-slip rug tape.  Do not have throw rugs and other things on the floor that can make you trip. What can I do in the bedroom?  Use night lights.  Make sure that you have a light by your bed that is easy to reach.  Do not use any sheets or blankets that are too big for your bed. They should not hang down onto the floor.  Have a firm chair that has side arms. You can use this for support while you get dressed.  Do not have throw rugs and other things on the floor that can make you trip. What can I do in the kitchen?  Clean up any spills right away.  Avoid walking on wet floors.  Keep items that you use a lot in easy-to-reach places.  If you need to reach something above you, use a strong step stool that has a grab bar.  Keep electrical cords out of the way.  Do  not use floor polish or wax that makes floors slippery. If you must use wax, use non-skid floor wax.  Do not have throw rugs and other things on the floor that can make you trip. What can I do with my stairs?  Do not leave any items on the stairs.  Make sure that there are handrails on both sides of the stairs and use them. Fix handrails that are broken or loose. Make sure that handrails are as long as the stairways.  Check any carpeting to make sure that it is firmly attached to the stairs. Fix any carpet that is loose or worn.  Avoid having throw rugs at the top or bottom of the stairs. If you do have throw rugs, attach them to the floor with carpet tape.  Make sure that you have a light switch at the top of the stairs and the bottom of the stairs. If you do not have them, ask someone to add them for you. What else can I do to help prevent falls?  Wear shoes that:  Do not have high heels.  Have rubber bottoms.  Are comfortable and fit you  well.  Are closed at the toe. Do not wear sandals.  If you use a stepladder:  Make sure that it is fully opened. Do not climb a closed stepladder.  Make sure that both sides of the stepladder are locked into place.  Ask someone to hold it for you, if possible.  Clearly mark and make sure that you can see:  Any grab bars or handrails.  First and last steps.  Where the edge of each step is.  Use tools that help you move around (mobility aids) if they are needed. These include:  Canes.  Walkers.  Scooters.  Crutches.  Turn on the lights when you go into a dark area. Replace any light bulbs as soon as they burn out.  Set up your furniture so you have a clear path. Avoid moving your furniture around.  If any of your floors are uneven, fix them.  If there are any pets around you, be aware of where they are.  Review your medicines with your doctor. Some medicines can make you feel dizzy. This can increase your chance of falling. Ask your doctor what other things that you can do to help prevent falls. This information is not intended to replace advice given to you by your health care provider. Make sure you discuss any questions you have with your health care provider. Document Released: 11/21/2008 Document Revised: 07/03/2015 Document Reviewed: 03/01/2014 Elsevier Interactive Patient Education  2017 [employer].

## 2017-03-18 ENCOUNTER — Telehealth: Payer: Self-pay

## 2017-03-18 NOTE — Telephone Encounter (Signed)
l mom to call back and schedule past due follow up appt

## 2017-03-18 NOTE — Telephone Encounter (Signed)
-----   Message from Janan Ridge, [place] sent at 03/18/2017 11:00 AM EST ----- Regarding: Patient needs an appointment [facility] Carved[facility] 12.5MG  BID. Patient was last seen by Dr. Caryl Comes 08/14/2015. Can you please try to schedule an appointment with Dr. Caryl Comes, Thank you!

## 2017-03-21 ENCOUNTER — Telehealth: Payer: Self-pay | Admitting: *Deleted

## 2017-03-21 NOTE — Telephone Encounter (Signed)
Received referral for low dose lung cancer screening CT scan. Message left at phone number listed in EMR for patient to call me back to facilitate scheduling scan.  

## 2017-03-22 ENCOUNTER — Telehealth: Payer: Self-pay | Admitting: *Deleted

## 2017-03-22 ENCOUNTER — Encounter: Payer: Self-pay | Admitting: *Deleted

## 2017-03-22 DIAGNOSIS — Z87891 Personal history of nicotine dependence: Secondary | ICD-10-CM

## 2017-03-22 DIAGNOSIS — Z122 Encounter for screening for malignant neoplasm of respiratory organs: Secondary | ICD-10-CM

## 2017-03-22 NOTE — Telephone Encounter (Signed)
Received referral for initial lung cancer screening scan. Contacted patient and obtained smoking history,(current, 35 pack year) as well as answering questions related to screening process. Patient denies signs of lung cancer such as weight loss or hemoptysis. Patient denies comorbidity that would prevent curative treatment if lung cancer were found. Patient is scheduled for shared decision making visit and CT scan on 04/12/17.

## 2017-03-24 ENCOUNTER — Ambulatory Visit (INDEPENDENT_AMBULATORY_CARE_PROVIDER_SITE_OTHER): Payer: Medicare Other | Admitting: Family Medicine

## 2017-03-24 ENCOUNTER — Encounter: Payer: Self-pay | Admitting: Family Medicine

## 2017-03-24 VITALS — BP 140/88 | HR 78 | Temp 97.9°F | Wt 230.8 lb

## 2017-03-24 DIAGNOSIS — I639 Cerebral infarction, unspecified: Secondary | ICD-10-CM

## 2017-03-24 DIAGNOSIS — L404 Guttate psoriasis: Secondary | ICD-10-CM | POA: Diagnosis not present

## 2017-03-24 DIAGNOSIS — I63429 Cerebral infarction due to embolism of unspecified anterior cerebral artery: Secondary | ICD-10-CM

## 2017-03-24 DIAGNOSIS — E1129 Type 2 diabetes mellitus with other diabetic kidney complication: Secondary | ICD-10-CM | POA: Diagnosis not present

## 2017-03-24 DIAGNOSIS — E1165 Type 2 diabetes mellitus with hyperglycemia: Secondary | ICD-10-CM

## 2017-03-24 DIAGNOSIS — IMO0002 Reserved for concepts with insufficient information to code with codable children: Secondary | ICD-10-CM

## 2017-03-24 DIAGNOSIS — E785 Hyperlipidemia, unspecified: Secondary | ICD-10-CM

## 2017-03-24 DIAGNOSIS — I10 Essential (primary) hypertension: Secondary | ICD-10-CM

## 2017-03-24 NOTE — Progress Notes (Signed)
Patient: Jocelyn Wells Female    DOB: December 07, 1955   62 y.o.   MRN: [id number] Visit Date: 03/24/2017  Today's Provider: Vernie Murders, PA   Chief Complaint  Patient presents with  . Diabetes  . Hyperlipidemia  . Hypertension  . Follow-up   Subjective:    HPI  Patient is here to follow up from [facility] done on 03/17/2017.    Diabetes Mellitus Type II, Follow-up:   RecentLabs   Lab Results  Component Value Date   HGBA1C 15.1 03/02/2017   Last seen for diabetes 1 years ago.  Management since then includes continue medications. Patient is followed by Dr. Gabriel Carina She reports good compliance with treatment. She is not having side effects.  Current symptoms include none and have been stable. Home blood sugar records: 420's this morning  Episodes of hypoglycemia? no              Current Insulin Regimen: Novolin- 45 Units  2 (two) times daily with a meal Weight trend: stable Current diet: no sweets Current exercise: none  ------------------------------------------------------------------------   Hypertension, follow-up:  BP Readings from Last 3 Encounters:  03/24/17 140/88  03/17/17 132/78  12/08/16 (!) 108/53   She was last seen for hypertension  1 year ago.  Management since that visit includes continue medication, diet and lose weight. She reports fair compliance with treatment. She is not having side effects.  She is not exercising. She is adherent to low salt diet.   Outside blood pressures are not being checked. She is experiencing none.  Patient denies chest pain, chest pressure/discomfort, irregular heart beat and palpitations.   Cardiovascular risk factors include advanced age (older than 43 for men, 48 for women), diabetes mellitus, dyslipidemia, hypertension, obesity (BMI >= 30 kg/m2) and smoking/ tobacco exposure.  Use of agents associated with hypertension: none.    ------------------------------------------------------------------------   Lipid/Cholesterol, Follow-up:   Last seen for this 1 year ago.  Management since that visit includes continue medication, restrict fats in diet, and lose weight  Last Lipid Panel: Labs(Brief)   Lab Results  Component Value Date   CHOL 158 01/30/2015   HDL 33 (L) 01/30/2015   LDLCALC 89 01/30/2015   TRIG 182 (H) 01/30/2015   CHOLHDL 4.8 01/30/2015   She reports fair compliance with treatment. She is not having side effects.   Wt Readings from Last 3 Encounters:  03/24/17 230 lb 12.8 oz (104.7 kg)  03/17/17 231 lb 3.2 oz (104.9 kg)  12/08/16 225 lb (102.1 kg)    Allergies  Allergen Reactions  . Codeine Nausea And Vomiting    Current Outpatient Medications:  .  aspirin 325 MG tablet, Take 1 tablet (325 mg total) by mouth daily., Disp: 30 tablet, Rfl: 0 .  atorvastatin (LIPITOR) 10 MG tablet, TAKE 1 TABLET (10 MG TOTAL) BY MOUTH DAILY., Disp: 90 tablet, Rfl: 3 .  B-D INS SYRINGE 0.5CC/31GX5/16 31G X 5/16" 0.5 ML MISC, , Disp: , Rfl:  .  BYDUREON 2 MG SRER, , Disp: , Rfl:  .  carvedilol (COREG) 12.5 MG tablet, Take 1 tablet (12.5 mg total) by mouth 2 (two) times daily., Disp: 180 tablet, Rfl: 3 .  diclofenac (VOLTAREN) 75 MG EC tablet, TAKE 1 TABLET BY MOUTH TWICE A DAY AS NEEDED, Disp: 60 tablet, Rfl: 2 .  glyBURIDE-metformin (GLUCOVANCE) 5-500 MG per tablet, Take 2 tablets by mouth 2 (two) times daily. , Disp: , Rfl:  .  loratadine (CLARITIN) 10 MG  tablet, Take 1 tablet by mouth daily., Disp: , Rfl:  .  losartan (COZAAR) 50 MG tablet, TAKE 1 TABLET BY MOUTH EVERY DAY, Disp: 30 tablet, Rfl: 1 .  NOVOLIN 70/30 RELION (70-30) 100 UNIT/ML injection, Inject 45 Units into the skin 2 (two) times daily with a meal. , Disp: , Rfl:  .  omeprazole (PRILOSEC) 40 MG capsule, Take 40 mg by mouth daily. , Disp: , Rfl:  .  PARoxetine (PAXIL) 40 MG tablet, Take 1 tablet (40 mg total) daily by mouth., Disp: 90  tablet, Rfl: 1 .  spironolactone (ALDACTONE) 25 MG tablet, Take 1/2 tablet (12.5 mg) by mouth once daily, Disp: , Rfl:   Review of Systems  Social History   Tobacco Use  . Smoking status: Current Every Day Smoker    Packs/day: 1.00    Years: 35.00    Pack years: 35.00    Types: Cigarettes    Start date: 02/08/1974  . Smokeless tobacco: Never Used  Substance Use Topics  . Alcohol use: No    Alcohol/week: 0.0 oz   Objective:   BP 140/88 (BP Location: Right Arm, Patient Position: Sitting, Cuff Size: Normal)   Pulse 78   Temp 97.9 F (36.6 C) (Oral)   Wt 230 lb 12.8 oz (104.7 kg)   SpO2 97%   BMI 37.25 kg/m   Physical Exam  Constitutional: She is oriented to person, place, and time. She appears well-developed and well-nourished. No distress.  HENT:  Head: Normocephalic and atraumatic.  Right Ear: Hearing and external ear normal.  Left Ear: Hearing and external ear normal.  Nose: Nose normal.  Mouth/Throat: Oropharynx is clear and moist.  Eyes: Conjunctivae and lids are normal. Right eye exhibits no discharge. Left eye exhibits no discharge. No scleral icterus.  Neck: Neck supple. No thyromegaly present.  Cardiovascular: Normal rate.  Pulmonary/Chest: Effort normal and breath sounds normal. No respiratory distress.  Abdominal: Soft. Bowel sounds are normal.  Musculoskeletal: Normal range of motion.  Lymphadenopathy:    She has no cervical adenopathy.  Neurological: She is alert and oriented to person, place, and time.  Skin: Skin is intact. Rash noted. No lesion noted.  Hx guttate psoriasis with recent flare on elbows and knees.  Psychiatric: She has a normal mood and affect. Her speech is normal and behavior is normal. Thought content normal.      Assessment & Plan:     1. Type II diabetes mellitus with renal manifestations, uncontrolled ([place]) Followed by Dr. Gabriel Carina (endocrinologist) with history of Hgb A1C 15% on 03-08-17. Presently on Bydureon 2 mg IM once a week,  Glyburide-Metformin 5-500 mg 2 tablets BID with Novolin 70/30 45 units BID. FBS was 420 this morning but she was surprised the Novolin 70/30 was supposed to be taken BID (only take it in the evening). Has an appointment to follow up with Dr. Gabriel Carina on Monday 03-28-17. Encouraged to get annual eye exam and take the Novolin BID. Recheck labs to follow up CKD stage 3. - CBC with Differential/Platelet - Comprehensive metabolic panel - TSH  2. Hyperlipidemia, unspecified hyperlipidemia type Tolerating Atorvastatin 10 mg qd without side effects. Recheck lipids, CMP and TSH. Continue low fat diet and work on some weight loss. - Lipid panel - Comprehensive metabolic panel - TSH  3. Essential hypertension BP borderline with use of the Losartan 50 mg qd and Spironolactone 25 mg 1/2 tablet qd. States Dr. Caryl Comes (cardiologist) had her on Carvedilol 12.5 mg BID but she did not  realize he had refilled the prescription and has been without it for a couple weeks. Recheck labs and recommend she recheck with pharmacy regarding refill waiting for her. - CBC with Differential/Platelet - Lipid panel - Comprehensive metabolic panel - TSH  4. Cerebrovascular accident (CVA) due to embolism of anterior cerebral artery, unspecified blood vessel laterality (HCC) No headaches or dizziness. No unilateral weakness or numbness. Continues to have very poor balance but walks without a cane or walker now. Check routine labs. - CBC with Differential/Platelet - Lipid panel  5. Guttate psoriasis Having recurrence of rough, thick, silvery scale on knees and elbows. Few scattered lesions. Requests referral back to Dr. Nehemiah Massed (dermatologist) to re-establish care. - Ambulatory referral to Dermatology       Vernie Murders, [facility]

## 2017-03-25 ENCOUNTER — Other Ambulatory Visit: Payer: Self-pay

## 2017-03-25 MED ORDER — CARVEDILOL 12.5 MG PO TABS: 12.5000 mg | ORAL_TABLET | Freq: Two times a day (BID) | ORAL | 0 refills | Status: DC

## 2017-03-28 ENCOUNTER — Encounter: Payer: Self-pay | Admitting: Psychiatry

## 2017-03-28 ENCOUNTER — Ambulatory Visit (INDEPENDENT_AMBULATORY_CARE_PROVIDER_SITE_OTHER): Payer: Medicare Other | Admitting: Psychiatry

## 2017-03-28 ENCOUNTER — Other Ambulatory Visit: Payer: Self-pay

## 2017-03-28 ENCOUNTER — Ambulatory Visit (INDEPENDENT_AMBULATORY_CARE_PROVIDER_SITE_OTHER): Payer: Medicare Other | Admitting: *Deleted

## 2017-03-28 VITALS — BP 130/78 | HR 77 | Temp 97.5°F | Wt 228.0 lb

## 2017-03-28 DIAGNOSIS — N183 Chronic kidney disease, stage 3 (moderate): Secondary | ICD-10-CM | POA: Diagnosis not present

## 2017-03-28 DIAGNOSIS — E1122 Type 2 diabetes mellitus with diabetic chronic kidney disease: Secondary | ICD-10-CM | POA: Diagnosis not present

## 2017-03-28 DIAGNOSIS — F17200 Nicotine dependence, unspecified, uncomplicated: Secondary | ICD-10-CM | POA: Diagnosis not present

## 2017-03-28 DIAGNOSIS — I639 Cerebral infarction, unspecified: Secondary | ICD-10-CM

## 2017-03-28 DIAGNOSIS — E1165 Type 2 diabetes mellitus with hyperglycemia: Secondary | ICD-10-CM | POA: Diagnosis not present

## 2017-03-28 DIAGNOSIS — F331 Major depressive disorder, recurrent, moderate: Secondary | ICD-10-CM

## 2017-03-28 DIAGNOSIS — E1129 Type 2 diabetes mellitus with other diabetic kidney complication: Secondary | ICD-10-CM | POA: Diagnosis not present

## 2017-03-28 DIAGNOSIS — E113393 Type 2 diabetes mellitus with moderate nonproliferative diabetic retinopathy without macular edema, bilateral: Secondary | ICD-10-CM | POA: Diagnosis not present

## 2017-03-28 DIAGNOSIS — J069 Acute upper respiratory infection, unspecified: Secondary | ICD-10-CM | POA: Diagnosis not present

## 2017-03-28 DIAGNOSIS — Z9119 Patient's noncompliance with other medical treatment and regimen: Secondary | ICD-10-CM | POA: Diagnosis not present

## 2017-03-28 DIAGNOSIS — R809 Proteinuria, unspecified: Secondary | ICD-10-CM | POA: Diagnosis not present

## 2017-03-28 DIAGNOSIS — Z794 Long term (current) use of insulin: Secondary | ICD-10-CM | POA: Diagnosis not present

## 2017-03-28 MED ORDER — PAROXETINE HCL 40 MG PO TABS: 40.0000 mg | ORAL_TABLET | Freq: Every day | ORAL | 1 refills | Status: DC

## 2017-03-28 NOTE — Progress Notes (Signed)
[place] MD/PA/NP OP Progress Note  03/28/2017 1:10 PM Jocelyn Wells  MRN:  [id number]  Subjective:   Pt is  a 62 -year-old female who presented for follow-up appointment. Patient reported that she fell on christmas eve and broken rib. She was taken to ER on new year eve by her family as she continued to 5 pain and was started on tramadol. Patient reported that she currently lives with her husband and her son has recently moved in as he is currently separated with his wife. She is concerned about her son and his children. She reported that his wife was getting treatment for mental health but now she has to stop taking her medications. Patient reported that she was not taking her insulin regularly and her blood sugar is elevated. She is going to manage her medications in a timely fashion. She stated that she sleeps well with the help of her medications. She is compliant with her medications. She denied having any suicidal homicidal ideations or plans. We discussed about medication management as well as healthy eating and diet and exercise and she agreed with the plan. She denied having any perceptual disturbances at this time.   Chief Complaint:  Chief Complaint    Follow-up; Medication Refill     Visit Diagnosis:     ICD-10-CM   1. MDD (major depressive disorder), recurrent episode, moderate ([place]) F33.1     Past Medical History:  Past Medical History:  Diagnosis Date  . Anxiety   . Cardiomyopathy [facility])    new to her Jan 2017  . COPD (chronic obstructive pulmonary disease) ([place])   . Depression   . Diabetes mellitus, type II ([place])   . HTN (hypertension)   . Obesity   . PONV (postoperative nausea and vomiting)   . Stroke [facility])    Jan 2017    Past Surgical History:  Procedure Laterality Date  . ABDOMINAL HYSTERECTOMY    . ANKLE FRACTURE SURGERY Left 2002  . BILATERAL SALPINGOOPHORECTOMY  2000  . CARDIAC CATHETERIZATION N/A 03/25/2015   Procedure: Right/Left Heart Cath and Coronary  Angiography;  Surgeon: Belva Crome, MD;  Location: [place] CV LAB;  Service: Cardiovascular;  Laterality: N/A;  . [place]  . COLONOSCOPY WITH PROPOFOL N/A 09/22/2016   Procedure: COLONOSCOPY WITH PROPOFOL;  Surgeon: Manya Silvas, MD;  Location: [facility] ENDOSCOPY;  Service: Endoscopy;  Laterality: N/A;  . EP IMPLANTABLE DEVICE N/A 08/05/2015   Procedure: Loop Recorder Insertion;  Surgeon: Deboraha Sprang, MD;  Location: [place] CV LAB;  Service: Cardiovascular;  Laterality: N/A;  . ESOPHAGOGASTRODUODENOSCOPY (EGD) WITH PROPOFOL N/A 09/22/2016   Procedure: ESOPHAGOGASTRODUODENOSCOPY (EGD) WITH PROPOFOL;  Surgeon: Manya Silvas, MD;  Location: [facility] ENDOSCOPY;  Service: Endoscopy;  Laterality: N/A;  . ESOPHAGOGASTRODUODENOSCOPY (EGD) WITH PROPOFOL N/A 12/08/2016   Procedure: ESOPHAGOGASTRODUODENOSCOPY (EGD) WITH PROPOFOL;  Surgeon: Manya Silvas, MD;  Location: [facility] ENDOSCOPY;  Service: Endoscopy;  Laterality: N/A;  . KNEE ARTHROSCOPY Left 2005  . TEE WITHOUT CARDIOVERSION N/A 01/31/2015   Procedure: TRANSESOPHAGEAL ECHOCARDIOGRAM (TEE);  Surgeon: Lelon Perla, MD;  Location: [place];  Service: Cardiovascular;  Laterality: N/A;  . [place]  . ULNAR NERVE TRANSPOSITION  2008   Family History:  Family History  Problem Relation Age of Onset  . Heart disease Mother        died from CHF  . Asthma Mother   . Diabetes Mother   . Heart disease Father   .  Aneurysm Father   . COPD Brother   . Diabetes Brother   . Alcohol abuse Paternal Aunt   . Anemia Neg Hx   . Arrhythmia Neg Hx   . Clotting disorder Neg Hx   . Fainting Neg Hx   . Heart attack Neg Hx   . Heart failure Neg Hx   . Hyperlipidemia Neg Hx   . Hypertension Neg Hx    Social History:  Social History   Socioeconomic History  . Marital status: Married    Spouse name: None  . Number of children: 2  . Years of education: None  . Highest education level: Bachelor's degree  (e.g., BA, AB, BS)  Social Needs  . Financial resource strain: Not hard at all  . Food insecurity - worry: Never true  . Food insecurity - inability: Never true  . Transportation needs - medical: No  . Transportation needs - non-medical: No  Occupational History  . Occupation: disabled    Comment: retired  Tobacco Use  . Smoking status: Current Every Day Smoker    Packs/day: 1.00    Years: 35.00    Pack years: 35.00    Types: Cigarettes    Start date: 02/08/1974  . Smokeless tobacco: Never Used  Substance and Sexual Activity  . Alcohol use: No    Alcohol/week: 0.0 oz  . Drug use: No  . Sexual activity: Yes    Birth control/protection: None  Other Topics Concern  . None  Social History Narrative   Lives at home with stepson and husband, dogs and cats   Caffeine  Drinks sweet tea.   Right handed.    Additional History:  Married x 18 years. Has 2 children and Her husband has 2 children as well Assessment:   Musculoskeletal: Strength & Muscle Tone: within normal limits Gait & Station: normal Patient leans: N/A  Psychiatric Specialty Exam: Medication Refill  Associated symptoms include coughing, nausea and vomiting. Pertinent negatives include no chills, headaches or rash.    Review of Systems  Constitutional: Negative for chills and weight loss.  HENT: Negative for nosebleeds and tinnitus.   Eyes: Negative for double vision and discharge.  Respiratory: Positive for cough.   Cardiovascular: Negative for orthopnea.  Gastrointestinal: Positive for nausea and vomiting. Negative for blood in stool and diarrhea.  Genitourinary: Negative for urgency.  Skin: Negative for rash.  Neurological: Positive for sensory change, speech change and focal weakness. Negative for tingling and headaches.  Endo/Heme/Allergies: Negative for environmental allergies.  Psychiatric/Behavioral: Positive for depression.  All other systems reviewed and are negative.   Blood pressure 130/78, pulse  77, temperature (!) 97.5 F (36.4 C), temperature source Oral, weight 228 lb (103.4 kg).Body mass index is 36.8 kg/m.  General Appearance: Casual  Eye Contact:  Fair  Speech:  Slow  Volume:  Normal  Mood:  Anxious  Affect:  Congruent  Thought Process:  Coherent  Orientation:  Full (Time, Place, and Person)  Thought Content:  WDL  Suicidal Thoughts:  No  Homicidal Thoughts:  No  Memory:  Immediate;   Fair  Judgement:  Fair  Insight:  Fair  Psychomotor Activity:  Normal  Concentration:  Fair  Recall:  [employer] of Knowledge: Fair  Language: Fair  Akathisia:  No  Handed:  Right  AIMS (if indicated):  none  Assets:  Communication Skills Desire for Improvement Physical Health Social Support  ADL's:  Intact  Cognition: WNL  Sleep:  6-7   Is the patient  at risk to self?  No. Has the patient been a risk to self in the past 6 months?  No. Has the patient been a risk to self within the distant past?  No. Is the patient a risk to others?  No. Has the patient been a risk to others in the past 6 months?  No. Has the patient been a risk to others within the distant past?  No.  Current Medications: Current Outpatient Medications  Medication Sig Dispense Refill  . aspirin 325 MG tablet Take 1 tablet (325 mg total) by mouth daily. 30 tablet 0  . atorvastatin (LIPITOR) 10 MG tablet TAKE 1 TABLET (10 MG TOTAL) BY MOUTH DAILY. 90 tablet 3  . B-D INS SYRINGE 0.5CC/31GX5/16 31G X 5/16" 0.5 ML MISC     . BYDUREON 2 MG SRER     . carvedilol (COREG) 12.5 MG tablet Take 1 tablet (12.5 mg total) by mouth 2 (two) times daily. 60 tablet 0  . diclofenac (VOLTAREN) 75 MG EC tablet TAKE 1 TABLET BY MOUTH TWICE A DAY AS NEEDED 60 tablet 2  . glyBURIDE-metformin (GLUCOVANCE) 5-500 MG per tablet Take 2 tablets by mouth 2 (two) times daily.     Marland Kitchen loratadine (CLARITIN) 10 MG tablet Take 1 tablet by mouth daily.    Marland Kitchen losartan (COZAAR) 50 MG tablet TAKE 1 TABLET BY MOUTH EVERY DAY 30 tablet 1  .  NOVOLIN 70/30 RELION (70-30) 100 UNIT/ML injection Inject 45 Units into the skin 2 (two) times daily with a meal.     . omeprazole (PRILOSEC) 40 MG capsule Take 40 mg by mouth daily.     Marland Kitchen PARoxetine (PAXIL) 40 MG tablet Take 1 tablet (40 mg total) by mouth daily. 90 tablet 1  . spironolactone (ALDACTONE) 25 MG tablet Take 1/2 tablet (12.5 mg) by mouth once daily     No current facility-administered medications for this visit.     Medical Decision Making:  Established Problem, Stable/Improving (1), Review of Psycho-Social Stressors (1) and Review of Last Therapy Session (1)  Treatment Plan Summary:Medication management    Paxil 40 mg by mouth daily at bedtime to help with the depression.     Follow-up in 3 months or earlier depending on her symptoms   More than 50% of the time spent in psychoeducation, counseling and coordination of care.     This note was generated in part or whole with voice recognition software. Voice regonition is usually quite accurate but there are transcription errors that can and very often do occur. I apologize for any typographical errors that were not detected and corrected.   Rainey Pines, MD

## 2017-03-29 NOTE — Progress Notes (Signed)
Carelink Summary Report / Loop Recorder 

## 2017-04-01 ENCOUNTER — Other Ambulatory Visit: Payer: Self-pay | Admitting: Family Medicine

## 2017-04-12 ENCOUNTER — Inpatient Hospital Stay: Payer: Medicare Other | Admitting: Oncology

## 2017-04-12 ENCOUNTER — Ambulatory Visit: Admission: RE | Admit: 2017-04-12 | Payer: Medicare Other | Source: Ambulatory Visit

## 2017-04-12 ENCOUNTER — Telehealth: Payer: Self-pay | Admitting: *Deleted

## 2017-04-12 NOTE — Telephone Encounter (Signed)
Patient called to reschedule the lung screening scan due to being sick. Rescheduled for 04/26/17.

## 2017-04-12 NOTE — Progress Notes (Deleted)
In accordance with CMS guidelines, patient has met eligibility criteria including age, absence of signs or symptoms of lung cancer.  Social History   Tobacco Use  . Smoking status: Current Every Day Smoker    Packs/day: 1.00    Years: 35.00    Pack years: 35.00    Types: Cigarettes    Start date: 02/08/1974  . Smokeless tobacco: Never Used  Substance Use Topics  . Alcohol use: No    Alcohol/week: 0.0 oz  . Drug use: No     A shared decision-making session was conducted prior to the performance of CT scan. This includes one or more decision aids, includes benefits and harms of screening, follow-up diagnostic testing, over-diagnosis, false positive rate, and total radiation exposure.  Counseling on the importance of adherence to annual lung cancer LDCT screening, impact of co-morbidities, and ability or willingness to undergo diagnosis and treatment is imperative for compliance of the program.  Counseling on the importance of continued smoking cessation for former smokers; the importance of smoking cessation for current smokers, and information about tobacco cessation interventions have been given to patient including [place] and [facility] quit [place] programs.  Written order for lung cancer screening with LDCT has been given to the patient and any and all questions have been answered to the best of my abilities.   Yearly follow up will be coordinated by Burgess Estelle, Thoracic Navigator.  Faythe Casa, NP 04/12/2017 11:36 AM

## 2017-04-19 ENCOUNTER — Ambulatory Visit (INDEPENDENT_AMBULATORY_CARE_PROVIDER_SITE_OTHER): Payer: Medicare Other | Admitting: Family Medicine

## 2017-04-19 ENCOUNTER — Ambulatory Visit
Admission: RE | Admit: 2017-04-19 | Discharge: 2017-04-19 | Disposition: A | Discharge status: Home / Self Care | Payer: Medicare Other | Source: Ambulatory Visit | Attending: Family Medicine | Admitting: Family Medicine

## 2017-04-19 ENCOUNTER — Ambulatory Visit (INDEPENDENT_AMBULATORY_CARE_PROVIDER_SITE_OTHER): Payer: Medicare Other | Admitting: Internal Medicine

## 2017-04-19 ENCOUNTER — Encounter: Payer: Self-pay | Admitting: Internal Medicine

## 2017-04-19 ENCOUNTER — Encounter: Payer: Self-pay | Admitting: Family Medicine

## 2017-04-19 VITALS — BP 128/74 | HR 76 | Temp 97.9°F | Resp 18 | Wt 230.0 lb

## 2017-04-19 VITALS — BP 138/80 | HR 76 | Ht 66.0 in | Wt 230.2 lb

## 2017-04-19 DIAGNOSIS — R05 Cough: Secondary | ICD-10-CM | POA: Diagnosis not present

## 2017-04-19 DIAGNOSIS — I639 Cerebral infarction, unspecified: Secondary | ICD-10-CM | POA: Diagnosis not present

## 2017-04-19 DIAGNOSIS — Z959 Presence of cardiac and vascular implant and graft, unspecified: Secondary | ICD-10-CM | POA: Diagnosis not present

## 2017-04-19 DIAGNOSIS — I428 Other cardiomyopathies: Secondary | ICD-10-CM | POA: Diagnosis not present

## 2017-04-19 DIAGNOSIS — J0140 Acute pansinusitis, unspecified: Secondary | ICD-10-CM

## 2017-04-19 DIAGNOSIS — R059 Cough, unspecified: Secondary | ICD-10-CM

## 2017-04-19 DIAGNOSIS — J984 Other disorders of lung: Secondary | ICD-10-CM | POA: Insufficient documentation

## 2017-04-19 MED ORDER — AZITHROMYCIN 250 MG PO TABS: ORAL_TABLET | ORAL | 0 refills | Status: DC

## 2017-04-19 MED ORDER — BENZONATATE 200 MG PO CAPS: 200.0000 mg | ORAL_CAPSULE | Freq: Two times a day (BID) | ORAL | 0 refills | Status: DC | PRN

## 2017-04-19 NOTE — Progress Notes (Signed)
Patient: Jocelyn Wells Female    DOB: 22-Mar-1955   62 y.o.   MRN: [id number] Visit Date: 04/19/2017  Today's Provider: Vernie Murders, PA   Chief Complaint  Patient presents with  . Cough   Subjective:    Cough  This is a new problem. The current episode started in the past 7 days (4 days). The problem has been gradually worsening. The cough is productive of sputum. Associated symptoms include a fever, headaches, nasal congestion, postnasal drip and wheezing. She has tried OTC cough suppressant (tried mucinex ) for the symptoms. The treatment provided no relief. Her past medical history is significant for COPD and environmental allergies.   Past Medical History:  Diagnosis Date  . Anxiety   . Cardiomyopathy [facility])    new to her Jan 2017  . COPD (chronic obstructive pulmonary disease) ([place])   . Depression   . Diabetes mellitus, type II ([place])   . HTN (hypertension)   . Obesity   . PONV (postoperative nausea and vomiting)   . Stroke [facility])    Jan 2017   Past Surgical History:  Procedure Laterality Date  . ABDOMINAL HYSTERECTOMY    . ANKLE FRACTURE SURGERY Left 2002  . BILATERAL SALPINGOOPHORECTOMY  2000  . CARDIAC CATHETERIZATION N/A 03/25/2015   Procedure: Right/Left Heart Cath and Coronary Angiography;  Surgeon: Belva Crome, MD;  Location: [place] CV LAB;  Service: Cardiovascular;  Laterality: N/A;  . [place]  . COLONOSCOPY WITH PROPOFOL N/A 09/22/2016   Procedure: COLONOSCOPY WITH PROPOFOL;  Surgeon: Manya Silvas, MD;  Location: [facility] ENDOSCOPY;  Service: Endoscopy;  Laterality: N/A;  . EP IMPLANTABLE DEVICE N/A 08/05/2015   Procedure: Loop Recorder Insertion;  Surgeon: Deboraha Sprang, MD;  Location: [place] [facility];  Service: Cardiovascular;  Laterality: N/A;  . ESOPHAGOGASTRODUODENOSCOPY (EGD) WITH PROPOFOL N/A 09/22/2016   Procedure: ESOPHAGOGASTRODUODENOSCOPY (EGD) WITH PROPOFOL;  Surgeon: Manya Silvas, MD;  Location: [facility]  ENDOSCOPY;  Service: Endoscopy;  Laterality: N/A;  . ESOPHAGOGASTRODUODENOSCOPY (EGD) WITH PROPOFOL N/A 12/08/2016   Procedure: ESOPHAGOGASTRODUODENOSCOPY (EGD) WITH PROPOFOL;  Surgeon: Manya Silvas, MD;  Location: [facility] ENDOSCOPY;  Service: Endoscopy;  Laterality: N/A;  . KNEE ARTHROSCOPY Left 2005  . TEE WITHOUT CARDIOVERSION N/A 01/31/2015   Procedure: TRANSESOPHAGEAL ECHOCARDIOGRAM (TEE);  Surgeon: Lelon Perla, MD;  Location: [place];  Service: Cardiovascular;  Laterality: N/A;  . [place]  . ULNAR NERVE TRANSPOSITION  2008   Family History  Problem Relation Age of Onset  . Heart disease Mother        died from CHF  . Asthma Mother   . Diabetes Mother   . Heart disease Father   . Aneurysm Father   . COPD Brother   . Diabetes Brother   . Alcohol abuse Paternal Aunt   . Anemia Neg Hx   . Arrhythmia Neg Hx   . Clotting disorder Neg Hx   . Fainting Neg Hx   . Heart attack Neg Hx   . Heart failure Neg Hx   . Hyperlipidemia Neg Hx   . Hypertension Neg Hx    Allergies  Allergen Reactions  . Codeine Nausea And Vomiting    Current Outpatient Medications:  .  aspirin 325 MG tablet, Take 1 tablet (325 mg total) by mouth daily., Disp: 30 tablet, Rfl: 0 .  atorvastatin (LIPITOR) 10 MG tablet, TAKE 1 TABLET (10 MG TOTAL) BY MOUTH DAILY., Disp: 90 tablet,  Rfl: 3 .  B-D INS SYRINGE 0.5CC/31GX5/16 31G X 5/16" 0.5 ML MISC, , Disp: , Rfl:  .  BYDUREON 2 MG SRER, , Disp: , Rfl:  .  carvedilol (COREG) 12.5 MG tablet, Take 1 tablet (12.5 mg total) by mouth 2 (two) times daily., Disp: 60 tablet, Rfl: 0 .  diclofenac (VOLTAREN) 75 MG EC tablet, TAKE 1 TABLET BY MOUTH TWICE A DAY AS NEEDED, Disp: 60 tablet, Rfl: 2 .  glyBURIDE-metformin (GLUCOVANCE) 5-500 MG per tablet, Take 2 tablets by mouth 2 (two) times daily. , Disp: , Rfl:  .  loratadine (CLARITIN) 10 MG tablet, Take 1 tablet by mouth daily., Disp: , Rfl:  .  losartan (COZAAR) 50 MG tablet, TAKE 1 TABLET BY MOUTH  EVERY DAY, Disp: 30 tablet, Rfl: 6 .  NOVOLIN 70/30 RELION (70-30) 100 UNIT/ML injection, Inject 45 Units into the skin 2 (two) times daily with a meal. , Disp: , Rfl:  .  omeprazole (PRILOSEC) 40 MG capsule, Take 40 mg by mouth daily. , Disp: , Rfl:  .  PARoxetine (PAXIL) 40 MG tablet, Take 1 tablet (40 mg total) by mouth daily., Disp: 90 tablet, Rfl: 1 .  spironolactone (ALDACTONE) 25 MG tablet, Take 12.5 mg by mouth once., Disp: , Rfl:   Review of Systems  Constitutional: Positive for fever.  HENT: Positive for postnasal drip.   Respiratory: Positive for cough and wheezing.   Allergic/Immunologic: Positive for environmental allergies.  Neurological: Positive for headaches.   Social History   Tobacco Use  . Smoking status: Current Every Day Smoker    Packs/day: 1.00    Years: 35.00    Pack years: 35.00    Types: Cigarettes    Start date: 02/08/1974  . Smokeless tobacco: Never Used  . Tobacco comment: "I can not quit smoking, I have tried"  Substance Use Topics  . Alcohol use: No    Alcohol/week: 0.0 oz   Objective:   BP 128/74 (BP Location: Right Arm, Patient Position: Sitting, Cuff Size: Normal)   Pulse 76   Temp 97.9 F (36.6 C)   Resp 18   Wt 230 lb (104.3 kg) Comment: per patient  SpO2 98%   BMI 37.12 kg/m  Vitals:   04/19/17 1333  BP: 128/74  Pulse: 76  Resp: 18  Temp: 97.9 F (36.6 C)  SpO2: 98%  Weight: 230 lb (104.3 kg)   Physical Exam  Constitutional: She is oriented to person, place, and time. She appears well-developed and well-nourished. No distress.  HENT:  Head: Normocephalic and atraumatic.  Right Ear: Hearing and external ear normal.  Left Ear: Hearing and external ear normal.  Nose: Nose normal.  Slightly reddened posterior pharynx with tenderness in all sinus regions.   Eyes: Conjunctivae and lids are normal. Right eye exhibits no discharge. Left eye exhibits no discharge. No scleral icterus.  Neck: Neck supple.  Cardiovascular: Normal rate.   Pulmonary/Chest: Effort normal and breath sounds normal. No respiratory distress.  Abdominal: Soft. Bowel sounds are normal.  Musculoskeletal: Normal range of motion.  Lymphadenopathy:    She has no cervical adenopathy.  Neurological: She is alert and oriented to person, place, and time.  Skin: Skin is intact. No lesion and no rash noted.  Psychiatric: She has a normal mood and affect. Her speech is normal and behavior is normal. Thought content normal.      Assessment & Plan:     1. Subacute pansinusitis Tenderness and PND with fever and cough over the  past week. Will treat with Z-pak and Benzonatate. May use Tylenol or Advil prn aches and pains. Check CBC and follow up pending reports. - CBC with Differential/Platelet  2. Cough Onset with fever and PND over the past week. Is a chronic smoker and getting some purulent sputum out occasionally. Will start Z-pak, continue Mucinex and add Tessalon Perles for cough. Get CXR and CBC to rule out pneumonia. Recheck pending reports.  - CBC with Differential/Platelet - DG Chest 2 View - benzonatate (TESSALON) 200 MG capsule; Take 1 capsule (200 mg total) by mouth 2 (two) times daily as needed for cough.  Dispense: 20 capsule; Refill: 0 - azithromycin (ZITHROMAX) 250 MG tablet; Take 2 tablets by mouth today then one daily for 4 days.  Dispense: 6 tablet; Refill: [place], PA  [facility]

## 2017-04-19 NOTE — Patient Instructions (Addendum)
Medication Instructions:  Your physician recommends that you continue on your current medications as directed. Please refer to the Current Medication list given to you today.   Labwork: none  Testing/Procedures: none  Follow-Up: Your physician wants you to follow-up in: [place]. You will receive a reminder letter in the mail two months in advance. If you don't receive a letter, please call our office to schedule the follow-up appointment.  If you need a refill on your cardiac medications before your next appointment, please call your pharmacy.

## 2017-04-19 NOTE — Progress Notes (Signed)
Patient Care Team: Chrismon, Vickki Muff, PA as PCP - General (Physician Assistant) Gabriel Carina Betsey Holiday, MD as Physician Assistant (Endocrinology) Deboraha Sprang, MD as Consulting Physician (Cardiology) Rainey Pines, MD as Referring Physician (Psychiatry) Ralene Bathe, MD as Consulting Physician (Dermatology) Manya Silvas, MD as Consulting Physician (Gastroenterology)   HPI  Jocelyn Wells is a 62 y.o. female Seen in follow-up for [facility] loop recorder implantation (DOI- 1/17 )for cryptogenic stroke in the setting of a nonischemic cardiomyopathy  DATE TEST EF   2/17 Cath EF 20-25% CA nl  5/17 Echo   40-45 %         Date Cr K Hgb  4/17 1.68 4.7 12.2  1/19 1.4 4.1     No palpitations  Continues to smoke;  Is sob  No edema  No chest pain    Records and Results Reviewed notes from endocrine/internal medicine  Past Medical History:  Diagnosis Date  . Anxiety   . Cardiomyopathy [facility])    new to her Jan 2017  . COPD (chronic obstructive pulmonary disease) ([place])   . Depression   . Diabetes mellitus, type II ([place])   . HTN (hypertension)   . Obesity   . PONV (postoperative nausea and vomiting)   . Stroke [facility])    Jan 2017    Past Surgical History:  Procedure Laterality Date  . ABDOMINAL HYSTERECTOMY    . ANKLE FRACTURE SURGERY Left 2002  . BILATERAL SALPINGOOPHORECTOMY  2000  . CARDIAC CATHETERIZATION N/A 03/25/2015   Procedure: Right/Left Heart Cath and Coronary Angiography;  Surgeon: Belva Crome, MD;  Location: [place] [facility];  Service: Cardiovascular;  Laterality: N/A;  . [place]  . COLONOSCOPY WITH PROPOFOL N/A 09/22/2016   Procedure: COLONOSCOPY WITH PROPOFOL;  Surgeon: Manya Silvas, MD;  Location: [facility] ENDOSCOPY;  Service: Endoscopy;  Laterality: N/A;  . EP IMPLANTABLE DEVICE N/A 08/05/2015   Procedure: Loop Recorder Insertion;  Surgeon: Deboraha Sprang, MD;  Location: [place] CV LAB;  Service: Cardiovascular;  Laterality: N/A;    . ESOPHAGOGASTRODUODENOSCOPY (EGD) WITH PROPOFOL N/A 09/22/2016   Procedure: ESOPHAGOGASTRODUODENOSCOPY (EGD) WITH PROPOFOL;  Surgeon: Manya Silvas, MD;  Location: [facility] ENDOSCOPY;  Service: Endoscopy;  Laterality: N/A;  . ESOPHAGOGASTRODUODENOSCOPY (EGD) WITH PROPOFOL N/A 12/08/2016   Procedure: ESOPHAGOGASTRODUODENOSCOPY (EGD) WITH PROPOFOL;  Surgeon: Manya Silvas, MD;  Location: [facility] ENDOSCOPY;  Service: Endoscopy;  Laterality: N/A;  . KNEE ARTHROSCOPY Left 2005  . TEE WITHOUT CARDIOVERSION N/A 01/31/2015   Procedure: TRANSESOPHAGEAL ECHOCARDIOGRAM (TEE);  Surgeon: Lelon Perla, MD;  Location: [place];  Service: Cardiovascular;  Laterality: N/A;  . [place]  . ULNAR NERVE TRANSPOSITION  2008    Current Outpatient Medications  Medication Sig Dispense Refill  . aspirin 325 MG tablet Take 1 tablet (325 mg total) by mouth daily. 30 tablet 0  . atorvastatin (LIPITOR) 10 MG tablet TAKE 1 TABLET (10 MG TOTAL) BY MOUTH DAILY. 90 tablet 3  . B-D INS SYRINGE 0.5CC/31GX5/16 31G X 5/16" 0.5 ML MISC     . BYDUREON 2 MG SRER     . carvedilol (COREG) 12.5 MG tablet Take 1 tablet (12.5 mg total) by mouth 2 (two) times daily. 60 tablet 0  . diclofenac (VOLTAREN) 75 MG EC tablet TAKE 1 TABLET BY MOUTH TWICE A DAY AS NEEDED 60 tablet 2  . glyBURIDE-metformin (GLUCOVANCE) 5-500 MG per tablet Take 2 tablets by mouth 2 (two) times  daily.     . loratadine (CLARITIN) 10 MG tablet Take 1 tablet by mouth daily.    Marland Kitchen losartan (COZAAR) 50 MG tablet TAKE 1 TABLET BY MOUTH EVERY DAY 30 tablet 6  . NOVOLIN 70/30 RELION (70-30) 100 UNIT/ML injection Inject 45 Units into the skin 2 (two) times daily with a meal.     . omeprazole (PRILOSEC) 40 MG capsule Take 40 mg by mouth daily.     Marland Kitchen PARoxetine (PAXIL) 40 MG tablet Take 1 tablet (40 mg total) by mouth daily. 90 tablet 1  . spironolactone (ALDACTONE) 25 MG tablet Take 12.5 mg by mouth once.     No current facility-administered  medications for this visit.     Allergies  Allergen Reactions  . Codeine Nausea And Vomiting      Review of Systems negative except from HPI and PMH  Physical Exam BP 138/80 (BP Location: Left Arm, Patient Position: Sitting, Cuff Size: Large)   Pulse 76   Ht 5\' 6"  (1.676 m)   Wt 230 lb 4 oz (104.4 kg)   BMI 37.16 kg/m  Well developed and nourished in no acute distress HENT normal Neck supple with JVP-flat Clear Regular rate and rhythm, no murmurs or gallops Abd-soft with active BS No Clubbing cyanosis edema Skin-warm and dry A & Oriented  Grossly normal sensory and motor function     Assessment and  Plan  Nonischemic cardiomyopathy  Sleep disordered breathing  Cryptogenic stroke  High Risk Medication Surveillance  Grade 3 renal insufficiency   She continues to smoke  Euvolemic continue current meds  Device without atrial fib  Continue on ASA   Current medicines are reviewed at length with the patient today .  The patient does not  have concerns regarding medicines.

## 2017-04-20 LAB — CBC WITH DIFFERENTIAL/PLATELET
BASOS: 0 %
Basophils Absolute: 0 10*3/uL (ref 0.0–0.2)
EOS (ABSOLUTE): 0.3 10*3/uL (ref 0.0–0.4)
EOS: 3 %
HEMATOCRIT: 35.2 % (ref 34.0–46.6)
Hemoglobin: 11.2 g/dL (ref 11.1–15.9)
Immature Grans (Abs): 0 10*3/uL (ref 0.0–0.1)
Immature Granulocytes: 0 %
LYMPHS ABS: 3 10*3/uL (ref 0.7–3.1)
Lymphs: 36 %
MCH: 27.5 pg (ref 26.6–33.0)
MCHC: 31.8 g/dL (ref 31.5–35.7)
MCV: 87 fL (ref 79–97)
MONOS ABS: 0.4 10*3/uL (ref 0.1–0.9)
Monocytes: 5 %
Neutrophils Absolute: 4.6 10*3/uL (ref 1.4–7.0)
Neutrophils: 56 %
Platelets: 250 10*3/uL (ref 150–379)
RBC: 4.07 x10E6/uL (ref 3.77–5.28)
RDW: 14.5 % (ref 12.3–15.4)
WBC: 8.3 10*3/uL (ref 3.4–10.8)

## 2017-04-21 ENCOUNTER — Telehealth: Payer: Self-pay | Admitting: *Deleted

## 2017-04-21 NOTE — Telephone Encounter (Signed)
Patient advised and verbally voiced understanding.  

## 2017-04-21 NOTE — Telephone Encounter (Signed)
LMOVM for pt to return call 

## 2017-04-21 NOTE — Telephone Encounter (Signed)
-----   Message from Margo Common, [place] sent at 04/21/2017 12:34 PM EDT ----- CBC and chest x-ray negative for pneumonia. Only signs of some bronchitis on x-ray. Finish all the antibiotics and recheck prn.

## 2017-04-25 LAB — CUP PACEART REMOTE DEVICE CHECK
Implantable Pulse Generator Implant Date: 20170627
MDC IDC SESS DTM: 20190218202143

## 2017-04-26 ENCOUNTER — Inpatient Hospital Stay: Payer: Medicare Other | Attending: Oncology | Admitting: Oncology

## 2017-04-26 ENCOUNTER — Ambulatory Visit
Admission: RE | Admit: 2017-04-26 | Discharge: 2017-04-26 | Disposition: A | Discharge status: Home / Self Care | Payer: Medicare Other | Source: Ambulatory Visit | Attending: Oncology | Admitting: Oncology

## 2017-04-26 DIAGNOSIS — K8020 Calculus of gallbladder without cholecystitis without obstruction: Secondary | ICD-10-CM | POA: Diagnosis not present

## 2017-04-26 DIAGNOSIS — Z794 Long term (current) use of insulin: Secondary | ICD-10-CM | POA: Diagnosis not present

## 2017-04-26 DIAGNOSIS — Z87891 Personal history of nicotine dependence: Secondary | ICD-10-CM

## 2017-04-26 DIAGNOSIS — N183 Chronic kidney disease, stage 3 (moderate): Secondary | ICD-10-CM | POA: Diagnosis not present

## 2017-04-26 DIAGNOSIS — I700 Atherosclerosis of aorta: Secondary | ICD-10-CM | POA: Diagnosis not present

## 2017-04-26 DIAGNOSIS — Z122 Encounter for screening for malignant neoplasm of respiratory organs: Secondary | ICD-10-CM | POA: Insufficient documentation

## 2017-04-26 DIAGNOSIS — J439 Emphysema, unspecified: Secondary | ICD-10-CM | POA: Insufficient documentation

## 2017-04-26 DIAGNOSIS — F17200 Nicotine dependence, unspecified, uncomplicated: Secondary | ICD-10-CM | POA: Diagnosis not present

## 2017-04-26 DIAGNOSIS — E113393 Type 2 diabetes mellitus with moderate nonproliferative diabetic retinopathy without macular edema, bilateral: Secondary | ICD-10-CM | POA: Diagnosis not present

## 2017-04-26 DIAGNOSIS — E1165 Type 2 diabetes mellitus with hyperglycemia: Secondary | ICD-10-CM | POA: Diagnosis not present

## 2017-04-26 DIAGNOSIS — E1129 Type 2 diabetes mellitus with other diabetic kidney complication: Secondary | ICD-10-CM | POA: Diagnosis not present

## 2017-04-26 DIAGNOSIS — R809 Proteinuria, unspecified: Secondary | ICD-10-CM | POA: Diagnosis not present

## 2017-04-26 DIAGNOSIS — E1122 Type 2 diabetes mellitus with diabetic chronic kidney disease: Secondary | ICD-10-CM | POA: Diagnosis not present

## 2017-04-26 NOTE — Progress Notes (Signed)
In accordance with CMS guidelines, patient has met eligibility criteria including age, absence of signs or symptoms of lung cancer.  Social History   Tobacco Use  . Smoking status: Current Every Day Smoker    Packs/day: 1.00    Years: 35.00    Pack years: 35.00    Types: Cigarettes    Start date: 02/08/1974  . Smokeless tobacco: Never Used  . Tobacco comment: "I can not quit smoking, I have tried"  Substance Use Topics  . Alcohol use: No    Alcohol/week: 0.0 oz  . Drug use: No     A shared decision-making session was conducted prior to the performance of CT scan. This includes one or more decision aids, includes benefits and harms of screening, follow-up diagnostic testing, over-diagnosis, false positive rate, and total radiation exposure.  Counseling on the importance of adherence to annual lung cancer LDCT screening, impact of co-morbidities, and ability or willingness to undergo diagnosis and treatment is imperative for compliance of the program.  Counseling on the importance of continued smoking cessation for former smokers; the importance of smoking cessation for current smokers, and information about tobacco cessation interventions have been given to patient including [place] and [address] quit [place] programs.  Written order for lung cancer screening with LDCT has been given to the patient and any and all questions have been answered to the best of my abilities.   Yearly follow up will be coordinated by Burgess Estelle, Thoracic Navigator.  Faythe Casa, NP 04/26/2017 9:46 AM

## 2017-04-28 DIAGNOSIS — L821 Other seborrheic keratosis: Secondary | ICD-10-CM | POA: Diagnosis not present

## 2017-04-28 DIAGNOSIS — L400 Psoriasis vulgaris: Secondary | ICD-10-CM | POA: Diagnosis not present

## 2017-04-28 DIAGNOSIS — D1801 Hemangioma of skin and subcutaneous tissue: Secondary | ICD-10-CM | POA: Diagnosis not present

## 2017-04-29 ENCOUNTER — Other Ambulatory Visit: Payer: Self-pay | Admitting: Family Medicine

## 2017-05-02 ENCOUNTER — Ambulatory Visit (INDEPENDENT_AMBULATORY_CARE_PROVIDER_SITE_OTHER): Payer: Medicare Other | Admitting: *Deleted

## 2017-05-02 ENCOUNTER — Telehealth: Payer: Self-pay | Admitting: *Deleted

## 2017-05-02 DIAGNOSIS — I639 Cerebral infarction, unspecified: Secondary | ICD-10-CM

## 2017-05-02 NOTE — Progress Notes (Signed)
Carelink Summary Report / Loop Recorder 

## 2017-05-02 NOTE — Telephone Encounter (Signed)
Notified patient of LDCT lung cancer screening program results with recommendation for 12 month follow up imaging. Also notified of incidental findings noted below and is encouraged to discuss further with PCP who will receive a copy of this note and/or the CT report. Patient verbalizes understanding.   IMPRESSION: 1. Lung-RADS 2, benign appearance or behavior. Continue annual screening with low-dose chest CT without contrast in 12 months. 2.  Aortic Atherosclerois (ICD10-170.0) 3.  Emphysema. (ICD10-J43.9) 4. Cholelithiasis the

## 2017-05-02 NOTE — Telephone Encounter (Signed)
Schedule follow up appointment with fasting labs in 2-3 weeks.

## 2017-05-10 DIAGNOSIS — L400 Psoriasis vulgaris: Secondary | ICD-10-CM | POA: Diagnosis not present

## 2017-05-12 DIAGNOSIS — L400 Psoriasis vulgaris: Secondary | ICD-10-CM | POA: Diagnosis not present

## 2017-05-23 ENCOUNTER — Ambulatory Visit: Payer: Federal, State, Local not specified - PPO | Admitting: Psychiatry

## 2017-05-23 ENCOUNTER — Other Ambulatory Visit: Payer: Self-pay | Admitting: Internal Medicine

## 2017-05-24 DIAGNOSIS — L400 Psoriasis vulgaris: Secondary | ICD-10-CM | POA: Diagnosis not present

## 2017-05-26 DIAGNOSIS — L400 Psoriasis vulgaris: Secondary | ICD-10-CM | POA: Diagnosis not present

## 2017-05-31 DIAGNOSIS — L400 Psoriasis vulgaris: Secondary | ICD-10-CM | POA: Diagnosis not present

## 2017-06-02 ENCOUNTER — Ambulatory Visit (INDEPENDENT_AMBULATORY_CARE_PROVIDER_SITE_OTHER): Payer: Medicare Other | Admitting: *Deleted

## 2017-06-02 DIAGNOSIS — I639 Cerebral infarction, unspecified: Secondary | ICD-10-CM | POA: Diagnosis not present

## 2017-06-03 NOTE — Progress Notes (Signed)
Carelink Summary Report / Loop Recorder 

## 2017-06-07 DIAGNOSIS — L400 Psoriasis vulgaris: Secondary | ICD-10-CM | POA: Diagnosis not present

## 2017-06-10 LAB — CUP PACEART REMOTE DEVICE CHECK
Date Time Interrogation Session: 20190323200921
MDC IDC PG IMPLANT DT: 20170627

## 2017-06-14 DIAGNOSIS — L400 Psoriasis vulgaris: Secondary | ICD-10-CM | POA: Diagnosis not present

## 2017-06-16 DIAGNOSIS — L400 Psoriasis vulgaris: Secondary | ICD-10-CM | POA: Diagnosis not present

## 2017-06-21 DIAGNOSIS — L400 Psoriasis vulgaris: Secondary | ICD-10-CM | POA: Diagnosis not present

## 2017-06-23 DIAGNOSIS — L400 Psoriasis vulgaris: Secondary | ICD-10-CM | POA: Diagnosis not present

## 2017-06-28 LAB — CUP PACEART REMOTE DEVICE CHECK
Date Time Interrogation Session: 20190425203902
Implantable Pulse Generator Implant Date: 20170627

## 2017-07-05 ENCOUNTER — Ambulatory Visit (INDEPENDENT_AMBULATORY_CARE_PROVIDER_SITE_OTHER): Payer: Medicare Other | Admitting: *Deleted

## 2017-07-05 DIAGNOSIS — I639 Cerebral infarction, unspecified: Secondary | ICD-10-CM | POA: Diagnosis not present

## 2017-07-06 NOTE — Progress Notes (Signed)
Carelink Summary Report / Loop Recorder 

## 2017-07-11 ENCOUNTER — Encounter: Payer: Self-pay | Admitting: Psychiatry

## 2017-07-11 ENCOUNTER — Ambulatory Visit (INDEPENDENT_AMBULATORY_CARE_PROVIDER_SITE_OTHER): Payer: Medicare Other | Admitting: Psychiatry

## 2017-07-11 ENCOUNTER — Other Ambulatory Visit: Payer: Self-pay

## 2017-07-11 VITALS — BP 142/82 | HR 73 | Temp 97.8°F | Wt 229.2 lb

## 2017-07-11 DIAGNOSIS — F331 Major depressive disorder, recurrent, moderate: Secondary | ICD-10-CM

## 2017-07-11 DIAGNOSIS — Z794 Long term (current) use of insulin: Secondary | ICD-10-CM | POA: Diagnosis not present

## 2017-07-11 DIAGNOSIS — E1165 Type 2 diabetes mellitus with hyperglycemia: Secondary | ICD-10-CM | POA: Diagnosis not present

## 2017-07-11 DIAGNOSIS — E1122 Type 2 diabetes mellitus with diabetic chronic kidney disease: Secondary | ICD-10-CM | POA: Diagnosis not present

## 2017-07-11 DIAGNOSIS — I639 Cerebral infarction, unspecified: Secondary | ICD-10-CM

## 2017-07-11 DIAGNOSIS — N183 Chronic kidney disease, stage 3 (moderate): Secondary | ICD-10-CM | POA: Diagnosis not present

## 2017-07-11 MED ORDER — PAROXETINE HCL 40 MG PO TABS: 40.0000 mg | ORAL_TABLET | Freq: Every day | ORAL | 1 refills | Status: DC

## 2017-07-11 NOTE — Progress Notes (Signed)
BH MD/PA/NP OP Progress Note  07/11/2017 2:43 PM Jocelyn Wells  MRN:  [id number]  Subjective:   Pt is  a 62 -year-old female who presented for follow-up appointment. Patient reported that she went to her trip in the [place] and has been doing well.  She reported that she enjoyed her a [place] long trip to the [place] with her [place] and was talking about that in detail.  She just came back last week.  Patient reported that she did not get a chance to see the room.  She stated that her husband bought her a scooter and it was helpful as she does not have to walk long during the trip.  She stated that she has been compliant with her medications and they have been helpful.  She does not feel tired.  She has been sleeping well.  Her mood remains stable.   She denied having any suicidal or homicidal ideations or plans.  She denied having any perceptual disturbances.  She has been trying to walk on a daily basis to lose weight and to remain active.   . We discussed about medication management as well as healthy eating and diet and exercise and she agreed with the plan. She denied having any perceptual disturbances at this time.   Chief Complaint:  Chief Complaint    Follow-up; Medication Refill     Visit Diagnosis:     ICD-10-CM   1. MDD (major depressive disorder), recurrent episode, moderate ([place]) F33.1     Past Medical History:  Past Medical History:  Diagnosis Date  . Anxiety   . Cardiomyopathy  [facility])    new to her Jan 2017  . COPD (chronic obstructive pulmonary disease) ([place])   . Depression   . Diabetes mellitus, type II ([place])   . HTN (hypertension)   . Obesity   . PONV (postoperative nausea and vomiting)   . Stroke [facility])    Jan 2017    Past Surgical History:  Procedure Laterality Date  . ABDOMINAL HYSTERECTOMY    . ANKLE FRACTURE SURGERY Left 2002  . BILATERAL SALPINGOOPHORECTOMY  2000  . CARDIAC CATHETERIZATION N/A 03/25/2015   Procedure: Right/Left Heart Cath and  Coronary Angiography;  Surgeon: Belva Crome, MD;  Location: [place] CV LAB;  Service: Cardiovascular;  Laterality: N/A;  . [place]  . COLONOSCOPY WITH PROPOFOL N/A 09/22/2016   Procedure: COLONOSCOPY WITH PROPOFOL;  Surgeon: Manya Silvas, MD;  Location: [facility] ENDOSCOPY;  Service: Endoscopy;  Laterality: N/A;  . EP IMPLANTABLE DEVICE N/A 08/05/2015   Procedure: Loop Recorder Insertion;  Surgeon: Deboraha Sprang, MD;  Location: [place] CV LAB;  Service: Cardiovascular;  Laterality: N/A;  . ESOPHAGOGASTRODUODENOSCOPY (EGD) WITH PROPOFOL N/A 09/22/2016   Procedure: ESOPHAGOGASTRODUODENOSCOPY (EGD) WITH PROPOFOL;  Surgeon: Manya Silvas, MD;  Location[facility] ENDOSCOPY;  Service: Endoscopy;  Laterality: N/A;  . ESOPHAGOGASTRODUODENOSCOPY (EGD) WITH PROPOFOL N/A 12/08/2016   Procedure: ESOPHAGOGASTRODUODENOSCOPY (EGD) WITH PROPOFOL;  Surgeon: Manya Silvas, MD;  Location: [facility] ENDOSCOPY;  Service: Endoscopy;  Laterality: N/A;  . KNEE ARTHROSCOPY Left 2005  . TEE WITHOUT CARDIOVERSION N/A 01/31/2015   Procedure: TRANSESOPHAGEAL ECHOCARDIOGRAM (TEE);  Surgeon: Lelon Perla, MD;  Location: [facility];  Service: Cardiovascular;  Laterality: N/A;  . [place]  . ULNAR NERVE TRANSPOSITION  2008   Family History:  Family History  Problem Relation Age of Onset  . Heart disease Mother        died from  CHF  . Asthma Mother   . Diabetes Mother   . Heart disease Father   . Aneurysm Father   . COPD Brother   . Diabetes Brother   . Alcohol abuse Paternal Aunt   . Anemia Neg Hx   . Arrhythmia Neg Hx   . Clotting disorder Neg Hx   . Fainting Neg Hx   . Heart attack Neg Hx   . Heart failure Neg Hx   . Hyperlipidemia Neg Hx   . Hypertension Neg Hx    Social History:  Social History   Socioeconomic History  . Marital status: Married    Spouse name: Not on file  . Number of children: 2  . Years of education: Not on file  . Highest education level:  Bachelor's degree (e.g., BA, AB, BS)  Occupational History  . Occupation: disabled    Comment: retired  Scientific laboratory technician  . Financial resource strain: Not hard at all  . Food insecurity:    Worry: Never true    Inability: Never true  . Transportation needs:    Medical: No    Non-medical: No  Tobacco Use  . Smoking status: Current Every Day Smoker    Packs/day: 1.00    Years: 35.00    Pack years: 35.00    Types: Cigarettes    Start date: 02/08/1974  . Smokeless tobacco: Never Used  . Tobacco comment: "I can not quit smoking, I have tried"  Substance and Sexual Activity  . Alcohol use: No    Alcohol/week: 0.0 oz  . Drug use: No  . Sexual activity: Yes    Birth control/protection: None  Lifestyle  . Physical activity:    Days per week: 0 days    Minutes per session: 0 min  . Stress: Only a little  Relationships  . Social connections:    Talks on phone: More than three times a week    Gets together: Once a week    Attends religious service: Never    Active member of club or organization: Yes    Attends meetings of clubs or organizations: Never    Relationship status: Married  Other Topics Concern  . Not on file  Social History Narrative   Lives at home with stepson and husband, dogs and cats   Caffeine  Drinks sweet tea.   Right handed.    Additional History:  Married x 18 years. Has 2 children and Her husband has 2 children as well Assessment:   Musculoskeletal: Strength & Muscle Tone: within normal limits Gait & Station: normal Patient leans: N/A  Psychiatric Specialty Exam: Medication Refill  Pertinent negatives include no chills, coughing, headaches, nausea, rash or vomiting.    Review of Systems  Constitutional: Negative for chills and weight loss.  HENT: Negative for nosebleeds and tinnitus.   Eyes: Negative for double vision and discharge.  Respiratory: Negative for cough.   Cardiovascular: Negative for orthopnea.  Gastrointestinal: Negative for blood in  stool, diarrhea, nausea and vomiting.  Genitourinary: Negative for urgency.  Skin: Negative for rash.  Neurological: Negative for tingling, sensory change, speech change, focal weakness and headaches.  Endo/Heme/Allergies: Negative for environmental allergies.  Psychiatric/Behavioral: Negative for depression.  All other systems reviewed and are negative.   Blood pressure (!) 142/82, pulse 73, temperature 97.8 F (36.6 C), temperature source Oral, weight 229 lb 3.2 oz (104 kg).Body mass index is 36.99 kg/m.  General Appearance: Casual  Eye Contact:  Fair  Speech:  Slow  Volume:  Normal  Mood:  Anxious  Affect:  Congruent  Thought Process:  Coherent  Orientation:  Full (Time, Place, and Person)  Thought Content:  WDL  Suicidal Thoughts:  No  Homicidal Thoughts:  No  Memory:  Immediate;   Fair  Judgement:  Fair  Insight:  Fair  Psychomotor Activity:  Normal  Concentration:  Fair  Recall:  [employer] of Knowledge: Fair  Language: Fair  Akathisia:  No  Handed:  Right  AIMS (if indicated):  none  Assets:  Communication Skills Desire for Improvement Physical Health Social Support  ADL's:  Intact  Cognition: WNL  Sleep:  6-7   Is the patient at risk to self?  No. Has the patient been a risk to self in the past 6 months?  No. Has the patient been a risk to self within the distant past?  No. Is the patient a risk to others?  No. Has the patient been a risk to others in the past 6 months?  No. Has the patient been a risk to others within the distant past?  No.  Current Medications: Current Outpatient Medications  Medication Sig Dispense Refill  . aspirin 325 MG tablet Take 1 tablet (325 mg total) by mouth daily. 30 tablet 0  . atorvastatin (LIPITOR) 10 MG tablet TAKE 1 TABLET (10 MG TOTAL) BY MOUTH DAILY. 90 tablet 3  . B-D INS SYRINGE 0.5CC/31GX5/16 31G X 5/16" 0.5 ML MISC     . BYDUREON 2 MG SRER     . carvedilol (COREG) 12.5 MG tablet TAKE 1 TABLET (12.5 MG TOTAL) BY  MOUTH 2 (TWO) TIMES DAILY. 60 tablet 11  . diclofenac (VOLTAREN) 75 MG EC tablet TAKE 1 TABLET BY MOUTH TWICE A DAY AS NEEDED 60 tablet 2  . glyBURIDE-metformin (GLUCOVANCE) 5-500 MG per tablet Take 2 tablets by mouth 2 (two) times daily.     Marland Kitchen loratadine (CLARITIN) 10 MG tablet Take 1 tablet by mouth daily.    Marland Kitchen losartan (COZAAR) 50 MG tablet TAKE 1 TABLET BY MOUTH EVERY DAY 30 tablet 6  . NOVOLIN 70/30 RELION (70-30) 100 UNIT/ML injection Inject 45 Units into the skin 2 (two) times daily with a meal.     . omeprazole (PRILOSEC) 40 MG capsule Take 40 mg by mouth daily.     Marland Kitchen PARoxetine (PAXIL) 40 MG tablet Take 1 tablet (40 mg total) by mouth daily. 90 tablet 1  . spironolactone (ALDACTONE) 25 MG tablet Take 12.5 mg by mouth once.     No current facility-administered medications for this visit.     Medical Decision Making:  Established Problem, Stable/Improving (1), Review of Psycho-Social Stressors (1) and Review of Last Therapy Session (1)  Treatment Plan Summary:Medication management    Paxil 40 mg by mouth daily at bedtime to help with the depression.  Follow-up in 3 months or earlier depending on her symptoms   More than 50% of the time spent in psychoeducation, counseling and coordination of care.     This note was generated in part or whole with voice recognition software. Voice regonition is usually quite accurate but there are transcription errors that can and very often do occur. I apologize for any typographical errors that were not detected and corrected.   Rainey Pines, MD

## 2017-07-12 DIAGNOSIS — L400 Psoriasis vulgaris: Secondary | ICD-10-CM | POA: Diagnosis not present

## 2017-07-27 DIAGNOSIS — L400 Psoriasis vulgaris: Secondary | ICD-10-CM | POA: Diagnosis not present

## 2017-07-27 LAB — CUP PACEART REMOTE DEVICE CHECK
Date Time Interrogation Session: 20190528204050
Implantable Pulse Generator Implant Date: 20170627

## 2017-08-04 DIAGNOSIS — L400 Psoriasis vulgaris: Secondary | ICD-10-CM | POA: Diagnosis not present

## 2017-08-08 ENCOUNTER — Ambulatory Visit (INDEPENDENT_AMBULATORY_CARE_PROVIDER_SITE_OTHER): Payer: Medicare Other | Admitting: *Deleted

## 2017-08-08 DIAGNOSIS — I639 Cerebral infarction, unspecified: Secondary | ICD-10-CM | POA: Diagnosis not present

## 2017-08-08 NOTE — Progress Notes (Signed)
Carelink Summary Report / Loop Recorder 

## 2017-08-09 DIAGNOSIS — L400 Psoriasis vulgaris: Secondary | ICD-10-CM | POA: Diagnosis not present

## 2017-08-10 DIAGNOSIS — E1129 Type 2 diabetes mellitus with other diabetic kidney complication: Secondary | ICD-10-CM | POA: Diagnosis not present

## 2017-08-10 DIAGNOSIS — N183 Chronic kidney disease, stage 3 (moderate): Secondary | ICD-10-CM | POA: Diagnosis not present

## 2017-08-10 DIAGNOSIS — Z794 Long term (current) use of insulin: Secondary | ICD-10-CM | POA: Diagnosis not present

## 2017-08-10 DIAGNOSIS — F17200 Nicotine dependence, unspecified, uncomplicated: Secondary | ICD-10-CM | POA: Diagnosis not present

## 2017-08-10 DIAGNOSIS — E1122 Type 2 diabetes mellitus with diabetic chronic kidney disease: Secondary | ICD-10-CM | POA: Diagnosis not present

## 2017-08-10 DIAGNOSIS — E113393 Type 2 diabetes mellitus with moderate nonproliferative diabetic retinopathy without macular edema, bilateral: Secondary | ICD-10-CM | POA: Diagnosis not present

## 2017-08-10 DIAGNOSIS — E1165 Type 2 diabetes mellitus with hyperglycemia: Secondary | ICD-10-CM | POA: Diagnosis not present

## 2017-08-10 DIAGNOSIS — E1142 Type 2 diabetes mellitus with diabetic polyneuropathy: Secondary | ICD-10-CM | POA: Diagnosis not present

## 2017-08-10 DIAGNOSIS — R809 Proteinuria, unspecified: Secondary | ICD-10-CM | POA: Diagnosis not present

## 2017-08-17 DIAGNOSIS — L400 Psoriasis vulgaris: Secondary | ICD-10-CM | POA: Diagnosis not present

## 2017-08-29 DIAGNOSIS — L400 Psoriasis vulgaris: Secondary | ICD-10-CM | POA: Diagnosis not present

## 2017-09-06 LAB — CUP PACEART REMOTE DEVICE CHECK
Date Time Interrogation Session: 20190630213518
Implantable Pulse Generator Implant Date: 20170627

## 2017-09-09 ENCOUNTER — Ambulatory Visit (INDEPENDENT_AMBULATORY_CARE_PROVIDER_SITE_OTHER): Payer: Medicare Other | Admitting: *Deleted

## 2017-09-09 DIAGNOSIS — I639 Cerebral infarction, unspecified: Secondary | ICD-10-CM

## 2017-09-12 ENCOUNTER — Ambulatory Visit (INDEPENDENT_AMBULATORY_CARE_PROVIDER_SITE_OTHER): Payer: Medicare Other | Admitting: Family Medicine

## 2017-09-12 ENCOUNTER — Encounter: Payer: Self-pay | Admitting: Family Medicine

## 2017-09-12 VITALS — BP 150/78 | HR 74 | Temp 97.5°F | Resp 16 | Wt 235.0 lb

## 2017-09-12 DIAGNOSIS — R404 Transient alteration of awareness: Secondary | ICD-10-CM

## 2017-09-12 DIAGNOSIS — M109 Gout, unspecified: Secondary | ICD-10-CM

## 2017-09-12 DIAGNOSIS — Z8673 Personal history of transient ischemic attack (TIA), and cerebral infarction without residual deficits: Secondary | ICD-10-CM | POA: Diagnosis not present

## 2017-09-12 DIAGNOSIS — L400 Psoriasis vulgaris: Secondary | ICD-10-CM | POA: Diagnosis not present

## 2017-09-12 MED ORDER — PREDNISONE 20 MG PO TABS: 40.0000 mg | ORAL_TABLET | Freq: Every day | ORAL | 0 refills | Status: AC

## 2017-09-12 NOTE — Patient Instructions (Signed)

## 2017-09-12 NOTE — Progress Notes (Signed)
Patient: Jocelyn Wells Female    DOB: [birth year]   62 y.o.   MRN: [id number] Visit Date: 09/12/2017  Today's Provider: Lavon Paganini, MD   I, Martha Clan, CMA, am acting as scribe for Lavon Paganini, MD.  Chief Complaint  Patient presents with  . Foot Pain   Subjective:    Foot Pain  This is a new problem. Episode onset: sudden while on a crusie between 09/01/2017- 09/10/2017. The problem has been gradually worsening. Associated symptoms include abdominal pain, a change in bowel habit (diarrhea), chills, congestion, coughing, diaphoresis, fatigue, joint swelling (ankle), myalgias (RLE), numbness (feet and hands; taking Gabpentin for this) and weakness. Pertinent negatives include no anorexia, chest pain, fever, headaches or neck pain.  Her PMH includes gout.  She doesn't have any cuts/open sores  Pt also mentions she had altered mental status while on the cruise. This was associated with fatigue, (chronic) dizziness, weakness, and slurred speech.  She is worried that she could have had another stroke.  She has a loop recorder (per chart review).     Allergies  Allergen Reactions  . Codeine Nausea And Vomiting     Current Outpatient Medications:  .  aspirin 325 MG tablet, Take 1 tablet (325 mg total) by mouth daily., Disp: 30 tablet, Rfl: 0 .  atorvastatin (LIPITOR) 10 MG tablet, TAKE 1 TABLET (10 MG TOTAL) BY MOUTH DAILY., Disp: 90 tablet, Rfl: 3 .  B-D INS SYRINGE 0.5CC/31GX5/16 31G X 5/16" 0.5 ML MISC, , Disp: , Rfl:  .  BYDUREON 2 MG SRER, , Disp: , Rfl:  .  carvedilol (COREG) 12.5 MG tablet, TAKE 1 TABLET (12.5 MG TOTAL) BY MOUTH 2 (TWO) TIMES DAILY., Disp: 60 tablet, Rfl: 11 .  diclofenac (VOLTAREN) 75 MG EC tablet, TAKE 1 TABLET BY MOUTH TWICE A DAY AS NEEDED, Disp: 60 tablet, Rfl: 2 .  gabapentin (NEURONTIN) 300 MG capsule, Take by mouth., Disp: , Rfl:  .  glyBURIDE-metformin (GLUCOVANCE) 5-500 MG per tablet, Take 2 tablets by mouth 2 (two) times daily. ,  Disp: , Rfl:  .  loratadine (CLARITIN) 10 MG tablet, Take 1 tablet by mouth daily., Disp: , Rfl:  .  losartan (COZAAR) 50 MG tablet, TAKE 1 TABLET BY MOUTH EVERY DAY, Disp: 30 tablet, Rfl: 6 .  NOVOLIN 70/30 RELION (70-30) 100 UNIT/ML injection, Inject 45 Units into the skin 2 (two) times daily with a meal. , Disp: , Rfl:  .  PARoxetine (PAXIL) 40 MG tablet, Take 1 tablet (40 mg total) by mouth daily., Disp: 90 tablet, Rfl: 1 .  spironolactone (ALDACTONE) 25 MG tablet, Take 12.5 mg by mouth once., Disp: , Rfl:   Review of Systems  Constitutional: Positive for chills, diaphoresis and fatigue. Negative for fever.  HENT: Positive for congestion.   Respiratory: Positive for cough.   Cardiovascular: Negative for chest pain.  Gastrointestinal: Positive for abdominal pain and change in bowel habit (diarrhea). Negative for anorexia.  Musculoskeletal: Positive for joint swelling (ankle) and myalgias (RLE). Negative for neck pain.  Neurological: Positive for dizziness, speech difficulty, weakness and numbness (feet and hands; taking Gabpentin for this). Negative for tremors, syncope and headaches.    Social History   Tobacco Use  . Smoking status: Current Every Day Smoker    Packs/day: 1.00    Years: 35.00    Pack years: 35.00    Types: Cigarettes    Start date: 02/08/1974  . Smokeless tobacco: Never Used  . Tobacco  comment: "I can not quit smoking, I have tried". 1 PPD  Substance Use Topics  . Alcohol use: No    Alcohol/week: 0.0 oz   Objective:   BP (!) 150/78 (BP Location: Left Arm, Patient Position: Sitting, Cuff Size: Large)   Pulse 74   Temp (!) 97.5 F (36.4 C) (Oral)   Resp 16   Wt 235 lb (106.6 kg)   SpO2 90%   BMI 37.93 kg/m  Vitals:   09/12/17 1336  BP: (!) 150/78  Pulse: 74  Resp: 16  Temp: (!) 97.5 F (36.4 C)  TempSrc: Oral  SpO2: 90%  Weight: 235 lb (106.6 kg)     Physical Exam  Constitutional: She is oriented to person, place, and time. She appears  well-developed and well-nourished. No distress.  HENT:  Head: Normocephalic and atraumatic.  Right Ear: External ear normal.  Left Ear: External ear normal.  Nose: Nose normal.  Mouth/Throat: Oropharynx is clear and moist.  Eyes: Pupils are equal, round, and reactive to light. Conjunctivae and EOM are normal. Right eye exhibits no discharge. Left eye exhibits no discharge. No scleral icterus.  Neck: Neck supple. No thyromegaly present.  Cardiovascular: Normal rate, regular rhythm, normal heart sounds and intact distal pulses.  No murmur heard. Pulmonary/Chest: Effort normal and breath sounds normal. No respiratory distress. She has no wheezes. She has no rales.  Abdominal: Soft. She exhibits no distension. There is no tenderness.  Musculoskeletal: She exhibits edema (1+ of LLE, 2+ of RLE) and tenderness (around R ankle, especially medial malleolous).  Lymphadenopathy:    She has no cervical adenopathy.  Neurological: She is alert and oriented to person, place, and time. A sensory deficit (b/l foot neuropathy, otherwise intact to light touch) is present. No cranial nerve deficit. She exhibits normal muscle tone.  Skin: Skin is warm and dry. Capillary refill takes less than 2 seconds. No rash noted. There is erythema (around R ankle).  Psychiatric: She has a normal mood and affect. Her behavior is normal.  Vitals reviewed.      Assessment & Plan:   1. Acute gout of right ankle, unspecified cause - R ankle appears to have acute gout flare - no skin breakdown to suggest cellulitis - will check uric acid and CBC - will treat with 7d burst of prednisone - return precautions discussed - Uric acid - CBC  2. Transient alteration of awareness 3. H/O: CVA (cerebrovascular accident) - neuro exam benign today - I do not know patient's baseline, but she feels "off" still - given that this occcured >1 week ago, no acute management necessary - continue statin, aspirin and antihypertensives -  will obtain CT head (no MRI given loop recorder) - advised f/u with Neuro - CT Head Wo Contrast; Future    Meds ordered this encounter  Medications  . predniSONE (DELTASONE) 20 MG tablet    Sig: Take 2 tablets (40 mg total) by mouth daily with breakfast for 7 days.    Dispense:  14 tablet    Refill:  0     Return in about 1 month (around 10/10/2017) for PCP f/u gout.   The entirety of the information documented in the History of Present Illness, Review of Systems and Physical Exam were personally obtained by me. Portions of this information were initially documented by Raquel Sarna Ratchford, CMA and reviewed by me for thoroughness and accuracy.    Virginia Crews, MD, MPH [facility] 09/12/2017 2:15 PM

## 2017-09-12 NOTE — Progress Notes (Signed)
Carelink Summary Report / Loop Recorder 

## 2017-09-13 ENCOUNTER — Telehealth: Payer: Self-pay

## 2017-09-13 LAB — CBC
HEMATOCRIT: 34.2 % (ref 34.0–46.6)
Hemoglobin: 10.2 g/dL — ABNORMAL LOW (ref 11.1–15.9)
MCH: 26 pg — ABNORMAL LOW (ref 26.6–33.0)
MCHC: 29.8 g/dL — AB (ref 31.5–35.7)
MCV: 87 fL (ref 79–97)
Platelets: 238 10*3/uL (ref 150–450)
RBC: 3.93 x10E6/uL (ref 3.77–5.28)
RDW: 16.1 % — AB (ref 12.3–15.4)
WBC: 10.5 10*3/uL (ref 3.4–10.8)

## 2017-09-13 LAB — URIC ACID: Uric Acid: 6.2 mg/dL (ref 2.5–7.1)

## 2017-09-13 NOTE — Telephone Encounter (Signed)
Pt advised.

## 2017-09-13 NOTE — Telephone Encounter (Signed)
-----   Message from Virginia Crews, MD sent at 09/13/2017  8:04 AM EDT ----- Uric acid is slightly elevated.  Hemoglobin is low (anemia).  Has patient noticed any bleeding?  Recommend rechecking with iron panel and other anemia labs at f/u visit.  Virginia Crews, MD, MPH [facility] 09/13/2017 8:04 AM

## 2017-09-19 ENCOUNTER — Ambulatory Visit
Admission: RE | Admit: 2017-09-19 | Discharge: 2017-09-19 | Disposition: A | Discharge status: Home / Self Care | Payer: Medicare Other | Source: Ambulatory Visit | Attending: Family Medicine | Admitting: Family Medicine

## 2017-09-19 DIAGNOSIS — R404 Transient alteration of awareness: Secondary | ICD-10-CM | POA: Insufficient documentation

## 2017-09-19 DIAGNOSIS — Z8673 Personal history of transient ischemic attack (TIA), and cerebral infarction without residual deficits: Secondary | ICD-10-CM | POA: Insufficient documentation

## 2017-09-19 DIAGNOSIS — R4781 Slurred speech: Secondary | ICD-10-CM | POA: Diagnosis not present

## 2017-09-19 DIAGNOSIS — E113393 Type 2 diabetes mellitus with moderate nonproliferative diabetic retinopathy without macular edema, bilateral: Secondary | ICD-10-CM | POA: Diagnosis not present

## 2017-09-20 ENCOUNTER — Telehealth: Payer: Self-pay | Admitting: Cardiology

## 2017-09-20 ENCOUNTER — Telehealth: Payer: Self-pay

## 2017-09-20 NOTE — Telephone Encounter (Signed)
She is supposed to be having a f/u appt soon with PCP.  Sounds like she needs to be re-evaluated.  Virginia Crews, MD, MPH [facility] 09/20/2017 12:02 PM

## 2017-09-20 NOTE — Telephone Encounter (Signed)
Patient advised as below. Patient still having problems with speech being slurred and sleeping a lot. Patient would like to know if you have any other recommendations to help with her symptoms.  [phone]

## 2017-09-20 NOTE — Telephone Encounter (Signed)
Spoke w/ pt and requested that she send a manual transmission b/c her home monitor has not updated in at least 14 days.   

## 2017-09-20 NOTE — Telephone Encounter (Signed)
-----   Message from Virginia Crews, MD sent at 09/19/2017  2:13 PM EDT ----- CT head is normal.  Does not appear that you had another stroke.  Of course MRI is more accurate than CT for this, but we cannot get an MRI due to your loop recorder.  Virginia Crews, MD, MPH [facility] 09/19/2017 2:13 PM

## 2017-09-20 NOTE — Telephone Encounter (Signed)
Her FU is on 10/11/2017. Should she be reevaluated sooner?

## 2017-09-20 NOTE — Telephone Encounter (Signed)
LMTCB

## 2017-09-21 NOTE — Telephone Encounter (Signed)
Pt advised to FU with PCP on 10/11/2017. She asked if she needed to contact her neurologist. Pt advised to call them, and to FU with them if necessary.

## 2017-09-21 NOTE — Telephone Encounter (Signed)
Only if symptoms change.  Sounds like she is stable (she reported slurred speech at last visit, though I did not appreciate this when talking to her)..  Early September is ok otherwise.  She was also planning to schedule a follow-up with her Neurologist.  [facility] remind her to do that.  Virginia Crews, MD, MPH [facility] 09/21/2017 9:27 AM

## 2017-09-23 ENCOUNTER — Other Ambulatory Visit: Payer: Self-pay | Admitting: Family Medicine

## 2017-09-23 ENCOUNTER — Telehealth: Payer: Self-pay | Admitting: Family Medicine

## 2017-09-23 MED ORDER — PREDNISONE 20 MG PO TABS: 40.0000 mg | ORAL_TABLET | Freq: Every day | ORAL | 0 refills | Status: DC

## 2017-09-23 NOTE — Telephone Encounter (Signed)
Pt was in on the 5th and seen Dr. B for gout in her foot.  She was given prednisone and finished a couple days ago.  Her foot is still hurting  Please advise  [phone]  Thanks teri

## 2017-09-23 NOTE — Telephone Encounter (Signed)
Please review

## 2017-09-23 NOTE — Telephone Encounter (Signed)
She can try another course of prednisone to see if this helps.  If she does not get any improvement with this, it may be something else other than gout.  Virginia Crews, MD, MPH [facility] 09/23/2017 11:58 AM

## 2017-09-23 NOTE — Telephone Encounter (Signed)
Patient advised.

## 2017-09-26 DIAGNOSIS — L404 Guttate psoriasis: Secondary | ICD-10-CM | POA: Diagnosis not present

## 2017-09-26 DIAGNOSIS — L720 Epidermal cyst: Secondary | ICD-10-CM | POA: Diagnosis not present

## 2017-09-26 DIAGNOSIS — L400 Psoriasis vulgaris: Secondary | ICD-10-CM | POA: Diagnosis not present

## 2017-09-28 ENCOUNTER — Encounter: Payer: Self-pay | Admitting: Cardiology

## 2017-10-01 ENCOUNTER — Other Ambulatory Visit: Payer: Self-pay | Admitting: Family Medicine

## 2017-10-04 ENCOUNTER — Telehealth: Payer: Self-pay | Admitting: *Deleted

## 2017-10-04 DIAGNOSIS — L400 Psoriasis vulgaris: Secondary | ICD-10-CM | POA: Diagnosis not present

## 2017-10-04 NOTE — Telephone Encounter (Signed)
Spoke with patient to assist her with sending a manual [facility] transmission for review. Received alert for 1 "pause" episode but no ECG received. Advised patient that I will review transmission when received and call her back if any abnormalities. Patient verbalizes agreement with plan.  Transmission successful. "Pause" episode is false, ECG shows SR w/undersensing.

## 2017-10-05 ENCOUNTER — Emergency Department: Payer: Medicare Other

## 2017-10-05 ENCOUNTER — Encounter: Payer: Self-pay | Admitting: Emergency Medicine

## 2017-10-05 ENCOUNTER — Other Ambulatory Visit: Payer: Self-pay

## 2017-10-05 ENCOUNTER — Inpatient Hospital Stay
Admission: EM | Admit: 2017-10-05 | Discharge: 2017-10-06 | DRG: 069 | Disposition: A | Discharge status: Home / Self Care | Payer: Medicare Other | Attending: Internal Medicine | Admitting: Internal Medicine

## 2017-10-05 DIAGNOSIS — G3184 Mild cognitive impairment, so stated: Secondary | ICD-10-CM | POA: Diagnosis not present

## 2017-10-05 DIAGNOSIS — Z8673 Personal history of transient ischemic attack (TIA), and cerebral infarction without residual deficits: Secondary | ICD-10-CM

## 2017-10-05 DIAGNOSIS — E669 Obesity, unspecified: Secondary | ICD-10-CM | POA: Diagnosis present

## 2017-10-05 DIAGNOSIS — Z8249 Family history of ischemic heart disease and other diseases of the circulatory system: Secondary | ICD-10-CM | POA: Diagnosis not present

## 2017-10-05 DIAGNOSIS — I255 Ischemic cardiomyopathy: Secondary | ICD-10-CM | POA: Diagnosis present

## 2017-10-05 DIAGNOSIS — F329 Major depressive disorder, single episode, unspecified: Secondary | ICD-10-CM | POA: Diagnosis present

## 2017-10-05 DIAGNOSIS — Z833 Family history of diabetes mellitus: Secondary | ICD-10-CM

## 2017-10-05 DIAGNOSIS — Z885 Allergy status to narcotic agent status: Secondary | ICD-10-CM

## 2017-10-05 DIAGNOSIS — Z825 Family history of asthma and other chronic lower respiratory diseases: Secondary | ICD-10-CM | POA: Diagnosis not present

## 2017-10-05 DIAGNOSIS — R4781 Slurred speech: Secondary | ICD-10-CM | POA: Diagnosis not present

## 2017-10-05 DIAGNOSIS — Z79899 Other long term (current) drug therapy: Secondary | ICD-10-CM | POA: Diagnosis not present

## 2017-10-05 DIAGNOSIS — I6523 Occlusion and stenosis of bilateral carotid arteries: Secondary | ICD-10-CM | POA: Diagnosis not present

## 2017-10-05 DIAGNOSIS — Z811 Family history of alcohol abuse and dependence: Secondary | ICD-10-CM

## 2017-10-05 DIAGNOSIS — R42 Dizziness and giddiness: Secondary | ICD-10-CM | POA: Diagnosis not present

## 2017-10-05 DIAGNOSIS — Z7982 Long term (current) use of aspirin: Secondary | ICD-10-CM | POA: Diagnosis not present

## 2017-10-05 DIAGNOSIS — J449 Chronic obstructive pulmonary disease, unspecified: Secondary | ICD-10-CM | POA: Diagnosis present

## 2017-10-05 DIAGNOSIS — R413 Other amnesia: Secondary | ICD-10-CM

## 2017-10-05 DIAGNOSIS — I10 Essential (primary) hypertension: Secondary | ICD-10-CM | POA: Diagnosis not present

## 2017-10-05 DIAGNOSIS — E785 Hyperlipidemia, unspecified: Secondary | ICD-10-CM | POA: Diagnosis present

## 2017-10-05 DIAGNOSIS — F17210 Nicotine dependence, cigarettes, uncomplicated: Secondary | ICD-10-CM | POA: Diagnosis present

## 2017-10-05 DIAGNOSIS — Z6837 Body mass index (BMI) 37.0-37.9, adult: Secondary | ICD-10-CM

## 2017-10-05 DIAGNOSIS — I639 Cerebral infarction, unspecified: Secondary | ICD-10-CM | POA: Diagnosis not present

## 2017-10-05 DIAGNOSIS — E1165 Type 2 diabetes mellitus with hyperglycemia: Secondary | ICD-10-CM | POA: Diagnosis not present

## 2017-10-05 DIAGNOSIS — Z716 Tobacco abuse counseling: Secondary | ICD-10-CM | POA: Diagnosis not present

## 2017-10-05 DIAGNOSIS — G459 Transient cerebral ischemic attack, unspecified: Principal | ICD-10-CM | POA: Diagnosis present

## 2017-10-05 DIAGNOSIS — Z7902 Long term (current) use of antithrombotics/antiplatelets: Secondary | ICD-10-CM

## 2017-10-05 DIAGNOSIS — Z820 Family history of epilepsy and other diseases of the nervous system: Secondary | ICD-10-CM

## 2017-10-05 DIAGNOSIS — I071 Rheumatic tricuspid insufficiency: Secondary | ICD-10-CM | POA: Diagnosis present

## 2017-10-05 DIAGNOSIS — I361 Nonrheumatic tricuspid (valve) insufficiency: Secondary | ICD-10-CM | POA: Diagnosis not present

## 2017-10-05 DIAGNOSIS — M109 Gout, unspecified: Secondary | ICD-10-CM | POA: Diagnosis present

## 2017-10-05 DIAGNOSIS — Z794 Long term (current) use of insulin: Secondary | ICD-10-CM

## 2017-10-05 DIAGNOSIS — Z9119 Patient's noncompliance with other medical treatment and regimen: Secondary | ICD-10-CM

## 2017-10-05 DIAGNOSIS — F419 Anxiety disorder, unspecified: Secondary | ICD-10-CM | POA: Diagnosis present

## 2017-10-05 DIAGNOSIS — Z90710 Acquired absence of both cervix and uterus: Secondary | ICD-10-CM | POA: Diagnosis not present

## 2017-10-05 DIAGNOSIS — R739 Hyperglycemia, unspecified: Secondary | ICD-10-CM

## 2017-10-05 LAB — COMPREHENSIVE METABOLIC PANEL
ALBUMIN: 3.2 g/dL — AB (ref 3.5–5.0)
ALT: 15 U/L (ref 0–44)
ANION GAP: 9 (ref 5–15)
AST: 18 U/L (ref 15–41)
Alkaline Phosphatase: 133 U/L — ABNORMAL HIGH (ref 38–126)
BUN: 22 mg/dL (ref 8–23)
CHLORIDE: 100 mmol/L (ref 98–111)
CO2: 27 mmol/L (ref 22–32)
Calcium: 8.4 mg/dL — ABNORMAL LOW (ref 8.9–10.3)
Creatinine, Ser: 1.7 mg/dL — ABNORMAL HIGH (ref 0.44–1.00)
GFR calc Af Amer: 36 mL/min — ABNORMAL LOW (ref >60)
GFR calc non Af Amer: 31 mL/min — ABNORMAL LOW (ref >60)
GLUCOSE: 317 mg/dL — AB (ref 70–99)
POTASSIUM: 4.8 mmol/L (ref 3.5–5.1)
Sodium: 136 mmol/L (ref 135–145)
TOTAL PROTEIN: 6.5 g/dL (ref 6.5–8.1)
Total Bilirubin: 0.6 mg/dL (ref 0.3–1.2)

## 2017-10-05 LAB — TROPONIN I

## 2017-10-05 LAB — CBC
HCT: 34.4 % — ABNORMAL LOW (ref 35.0–47.0)
Hemoglobin: 11.2 g/dL — ABNORMAL LOW (ref 12.0–16.0)
MCH: 27.5 pg (ref 26.0–34.0)
MCHC: 32.6 g/dL (ref 32.0–36.0)
MCV: 84.5 fL (ref 80.0–100.0)
PLATELETS: 181 10*3/uL (ref 150–440)
RBC: 4.07 MIL/uL (ref 3.80–5.20)
RDW: 16.5 % — AB (ref 11.5–14.5)
WBC: 9.5 10*3/uL (ref 3.6–11.0)

## 2017-10-05 LAB — GLUCOSE, CAPILLARY
Glucose-Capillary: 357 mg/dL — ABNORMAL HIGH (ref 70–99)
Glucose-Capillary: 277 mg/dL — ABNORMAL HIGH (ref 70–99)

## 2017-10-05 LAB — APTT

## 2017-10-05 LAB — PROTIME-INR
INR: 0.93
Prothrombin Time: 12.4 seconds (ref 11.4–15.2)

## 2017-10-05 MED ORDER — ASPIRIN 81 MG PO CHEW
324.0000 mg | CHEWABLE_TABLET | Freq: Once | ORAL | Status: AC
  Administered 2017-10-05: 324 mg via ORAL
  Filled 2017-10-05: qty 4

## 2017-10-05 MED ORDER — PAROXETINE HCL 20 MG PO TABS
40.0000 mg | ORAL_TABLET | Freq: Every day | ORAL | Status: DC
  Administered 2017-10-06: 09:00:00 40 mg via ORAL
  Filled 2017-10-05 (×2): qty 2

## 2017-10-05 MED ORDER — INSULIN ASPART PROT & ASPART (70-30 MIX) 100 UNIT/ML ~~LOC~~ SUSP
40.0000 [IU] | Freq: Two times a day (BID) | SUBCUTANEOUS | Status: DC
  Administered 2017-10-06: 40 [IU] via SUBCUTANEOUS
  Filled 2017-10-05: qty 10

## 2017-10-05 MED ORDER — SENNOSIDES-DOCUSATE SODIUM 8.6-50 MG PO TABS: 1.0000 | ORAL_TABLET | Freq: Every evening | ORAL | Status: DC | PRN

## 2017-10-05 MED ORDER — SPIRONOLACTONE 25 MG PO TABS
12.5000 mg | ORAL_TABLET | Freq: Every day | ORAL | Status: DC
  Administered 2017-10-06: 09:00:00 12.5 mg via ORAL
  Filled 2017-10-05: qty 0.5
  Filled 2017-10-05: qty 1
  Filled 2017-10-05: qty 0.5

## 2017-10-05 MED ORDER — ATORVASTATIN CALCIUM 20 MG PO TABS
10.0000 mg | ORAL_TABLET | Freq: Every day | ORAL | Status: DC
  Administered 2017-10-06: 09:00:00 10 mg via ORAL
  Filled 2017-10-05: qty 1

## 2017-10-05 MED ORDER — ENOXAPARIN SODIUM 40 MG/0.4ML ~~LOC~~ SOLN
40.0000 mg | SUBCUTANEOUS | Status: DC
  Administered 2017-10-05: 40 mg via SUBCUTANEOUS
  Filled 2017-10-05: qty 0.4

## 2017-10-05 MED ORDER — LOSARTAN POTASSIUM 50 MG PO TABS
50.0000 mg | ORAL_TABLET | Freq: Every day | ORAL | Status: DC
  Administered 2017-10-06: 09:00:00 50 mg via ORAL
  Filled 2017-10-05: qty 1

## 2017-10-05 MED ORDER — ACETAMINOPHEN 325 MG PO TABS: 650.0000 mg | ORAL_TABLET | ORAL | Status: DC | PRN

## 2017-10-05 MED ORDER — ACETAMINOPHEN 160 MG/5ML PO SOLN
650.0000 mg | ORAL | Status: DC | PRN
  Filled 2017-10-05: qty 20.3

## 2017-10-05 MED ORDER — CLOPIDOGREL BISULFATE 75 MG PO TABS
75.0000 mg | ORAL_TABLET | Freq: Every day | ORAL | Status: DC
  Administered 2017-10-06: 75 mg via ORAL
  Filled 2017-10-05: qty 1

## 2017-10-05 MED ORDER — INSULIN ASPART 100 UNIT/ML ~~LOC~~ SOLN
10.0000 [IU] | Freq: Once | SUBCUTANEOUS | Status: AC
  Administered 2017-10-05: 10 [IU] via SUBCUTANEOUS
  Filled 2017-10-05: qty 1

## 2017-10-05 MED ORDER — INSULIN ASPART 100 UNIT/ML ~~LOC~~ SOLN
0.0000 [IU] | Freq: Every day | SUBCUTANEOUS | Status: DC
  Administered 2017-10-05: 3 [IU] via SUBCUTANEOUS
  Filled 2017-10-05: qty 1

## 2017-10-05 MED ORDER — LORATADINE 10 MG PO TABS
10.0000 mg | ORAL_TABLET | Freq: Every day | ORAL | Status: DC
  Administered 2017-10-06: 10 mg via ORAL
  Filled 2017-10-05: qty 1

## 2017-10-05 MED ORDER — INSULIN ASPART 100 UNIT/ML ~~LOC~~ SOLN
0.0000 [IU] | Freq: Three times a day (TID) | SUBCUTANEOUS | Status: DC
  Administered 2017-10-06: 12:00:00 11 [IU] via SUBCUTANEOUS
  Administered 2017-10-06: 09:00:00 5 [IU] via SUBCUTANEOUS
  Filled 2017-10-05 (×2): qty 1

## 2017-10-05 MED ORDER — ACETAMINOPHEN 650 MG RE SUPP: 650.0000 mg | RECTAL | Status: DC | PRN

## 2017-10-05 MED ORDER — SODIUM CHLORIDE 0.9 % IV BOLUS
1000.0000 mL | Freq: Once | INTRAVENOUS | Status: AC
  Administered 2017-10-05: 1000 mL via INTRAVENOUS

## 2017-10-05 MED ORDER — STROKE: EARLY STAGES OF RECOVERY BOOK
Freq: Once | Status: AC
  Administered 2017-10-05: 20:00:00

## 2017-10-05 MED ORDER — ASPIRIN 81 MG PO CHEW
81.0000 mg | CHEWABLE_TABLET | Freq: Every day | ORAL | Status: DC
  Administered 2017-10-06: 09:00:00 81 mg via ORAL
  Filled 2017-10-05: qty 1

## 2017-10-05 MED ORDER — GABAPENTIN 300 MG PO CAPS
300.0000 mg | ORAL_CAPSULE | Freq: Every day | ORAL | Status: DC
  Administered 2017-10-06: 300 mg via ORAL
  Filled 2017-10-05: qty 1

## 2017-10-05 MED ORDER — CARVEDILOL 3.125 MG PO TABS
12.5000 mg | ORAL_TABLET | Freq: Two times a day (BID) | ORAL | Status: DC
  Administered 2017-10-06: 09:00:00 12.5 mg via ORAL
  Filled 2017-10-05: qty 4

## 2017-10-05 NOTE — Progress Notes (Signed)
Family Meeting Note  Advance Directive:no  Today a meeting took place with the Patient.  The following clinical team members were present during this meeting:MD  The following were discussed:Patient's diagnosis CVA Slurred speech Uncontrolled Diabetes: , Patient's progosis: > 12 months and Goals for treatment: Full Code  Additional follow-up to be provided: chaplain consult requested by family to create advanced directives  Time spent during discussion:16 minutes  Jocelyn Wells, Ulice Bold, MD

## 2017-10-05 NOTE — ED Provider Notes (Signed)
[facility] Emergency Department Provider Note  ____________________________________________  Time seen: Approximately 4:46 PM  I have reviewed the triage vital signs and the nursing notes.   HISTORY  Chief Complaint Weakness    HPI Jocelyn Wells is a 62 y.o. female history of CVA on full dose aspirin, COPD, cardiomyopathy, DM, HTN, presenting for slurred speech, dizziness.  The patient states that 3 weeks ago she was on a cruise, and developed slurred speech and dizziness which made it difficult for her to walk.  She also reported generalized weakness.  The symptoms were improving until this morning when she woke up and her slurred speech had worsened.  She has recently been treated for gout but denies any other infectious process.  She denies any dysuria or urinary frequency, no nausea vomiting or diarrhea, no other changes in her medications.  She has not been having headaches, visual changes, speech changes, numbness tingling.  However, she has been more "forgetful."  Past Medical History:  Diagnosis Date  . Anxiety   . Cardiomyopathy [facility])    new to her Jan 2017  . COPD (chronic obstructive pulmonary disease) ([place])   . Depression   . Diabetes mellitus, type II ([place])   . HTN (hypertension)   . Obesity   . PONV (postoperative nausea and vomiting)   . Stroke [facility])    Jan 2017    Patient Active Problem List   Diagnosis Date Noted  . Depression (emotion) 06/12/2015  . Cerebrovascular accident (CVA) due to bilateral thrombosis of carotid arteries ([place]) 06/12/2015  . Excessive daytime sleepiness 06/12/2015  . Nicotine dependence 04/24/2015  . Snoring 04/24/2015  . Sleep paralysis, recurrent isolated 04/24/2015  . Hypersomnia with sleep apnea 04/24/2015  . Cataplexy 04/24/2015  . Morbid obesity due to excess calories ([place]) 04/24/2015  . COPD exacerbation ([place]) 04/24/2015  . Cerebrovascular accident (CVA) due to thrombosis of left middle cerebral  artery ([place]) 04/24/2015  . Embolic stroke ([place]) 93/23/5573  . Fatigue 04/23/2015  . Type 2 diabetes mellitus ([place]) 04/07/2015  . Chronic systolic heart failure ([place]) 03/25/2015  . Stroke ([place]) 01/31/2015  . Nausea & vomiting 01/29/2015  . AKI (acute kidney injury) ([place]) 01/29/2015  . CVA (cerebral infarction) 01/29/2015  . Stroke (cerebrum) ([place]) 01/29/2015  . Dizziness and giddiness 07/05/2014  . Blood pressure elevated 07/05/2014  . Carbuncle and furuncle 07/05/2014  . BP (high blood pressure) 07/05/2014  . Does not feel right 07/05/2014  . Pain of perianal area 07/05/2014  . Guttate psoriasis 07/05/2014  . Gonalgia 07/05/2014  . H/O diabetes mellitus 06/05/2014  . CAFL (chronic airflow limitation) ([place]) 06/05/2014  . H/O visual disturbance 06/05/2014  . H/O elevated lipids 06/05/2014  . H/O: HTN (hypertension) 06/05/2014  . H/O: osteoarthritis 06/05/2014  . Initial insomnia 06/05/2014  . Anxiety, generalized 06/05/2014  . Depression, major, recurrent, moderate ([place]) 06/05/2014  . Microalbuminuria 12/23/2013  . Long term current use of insulin ([place]) 12/23/2013  . Compulsive tobacco user syndrome 12/23/2013  . Type 2 diabetes mellitus with other diabetic kidney complication ([place]) 22/03/5425  . Type II diabetes mellitus with renal manifestations, uncontrolled ([place]) 12/23/2013  . Contusion of cheek 03/31/2009  . Change in blood platelet count 06/08/2007  . Blood in feces 06/01/2007  . D (diarrhea) 06/01/2007  . Disturbance of skin sensation 09/02/2006  . Difficulty hearing 07/25/2006  . Acute onset aura migraine 06/27/2006  . Cephalalgia 05/27/2006  . Clinical depression 10/22/2005  . Diabetes mellitus type 2, uncontrolled ([place])  10/22/2005  . HLD (hyperlipidemia) 10/22/2005  . Arthritis, degenerative 10/22/2005  . Current tobacco use 10/22/2005    Past Surgical History:  Procedure Laterality Date  . ABDOMINAL HYSTERECTOMY    . ANKLE FRACTURE SURGERY Left 2002  .  BILATERAL SALPINGOOPHORECTOMY  2000  . CARDIAC CATHETERIZATION N/A 03/25/2015   Procedure: Right/Left Heart Cath and Coronary Angiography;  Surgeon: Belva Crome, MD;  Location: [place] CV LAB;  Service: Cardiovascular;  Laterality: N/A;  . [place]  . COLONOSCOPY WITH PROPOFOL N/A 09/22/2016   Procedure: COLONOSCOPY WITH PROPOFOL;  Surgeon: Manya Silvas, MD;  Location: [facility] ENDOSCOPY;  Service: Endoscopy;  Laterality: N/A;  . EP IMPLANTABLE DEVICE N/A 08/05/2015   Procedure: Loop Recorder Insertion;  Surgeon: Deboraha Sprang, MD;  Location: [place] CV LAB;  Service: Cardiovascular;  Laterality: N/A;  . ESOPHAGOGASTRODUODENOSCOPY (EGD) WITH PROPOFOL N/A 09/22/2016   Procedure: ESOPHAGOGASTRODUODENOSCOPY (EGD) WITH PROPOFOL;  Surgeon: Manya Silvas, MD;  Location: [facility] ENDOSCOPY;  Service: Endoscopy;  Laterality: N/A;  . ESOPHAGOGASTRODUODENOSCOPY (EGD) WITH PROPOFOL N/A 12/08/2016   Procedure: ESOPHAGOGASTRODUODENOSCOPY (EGD) WITH PROPOFOL;  Surgeon: Manya Silvas, MD;  Location: [facility] ENDOSCOPY;  Service: Endoscopy;  Laterality: N/A;  . KNEE ARTHROSCOPY Left 2005  . TEE WITHOUT CARDIOVERSION N/A 01/31/2015   Procedure: TRANSESOPHAGEAL ECHOCARDIOGRAM (TEE);  Surgeon: Lelon Perla, MD;  Location: [place];  Service: Cardiovascular;  Laterality: N/A;  . [place]  . ULNAR NERVE TRANSPOSITION  2008    Current Outpatient Rx  . Order #: [id number] Class: Print  . Order #: [id number] Class: Normal  . Order #: [id number] Class: Historical Med  . Order #: [id number] Class: Historical Med  . Order #: [id number] Class: Normal  . Order #: [id number] Class: Normal  . Order #: [id number] Class: Historical Med  . Order #: [id number] Class: Historical Med  . Order #: [id number] Class: Historical Med  . Order #: [id number] Class: Normal  . Order #: [id number] Class: Historical Med  . Order #: [id number] Class: Normal  . Order #: [id number] Class: Historical Med     Allergies Codeine  Family History  Problem Relation Age of Onset  . Heart disease Mother        died from CHF  . Asthma Mother   . Diabetes Mother   . Heart disease Father   . Aneurysm Father   . COPD Brother   . Diabetes Brother   . Alcohol abuse Paternal Aunt   . Anemia Neg Hx   . Arrhythmia Neg Hx   . Clotting disorder Neg Hx   . Fainting Neg Hx   . Heart attack Neg Hx   . Heart failure Neg Hx   . Hyperlipidemia Neg Hx   . Hypertension Neg Hx     Social History Social History   Tobacco Use  . Smoking status: Current Every Day Smoker    Packs/day: 1.00    Years: 35.00    Pack years: 35.00    Types: Cigarettes    Start date: 02/08/1974  . Smokeless tobacco: Never Used  . Tobacco comment: "I can not quit smoking, I have tried". 1 PPD  Substance Use Topics  . Alcohol use: No    Alcohol/week: 0.0 standard drinks  . Drug use: No    Review of Systems Constitutional: No fever/chills.  No syncope.  Positive dizziness. Eyes: No visual changes.  No blurred or double vision. ENT: No sore throat. No congestion or rhinorrhea. Cardiovascular: Denies chest pain. Denies palpitations. Respiratory: Denies shortness of breath.  No cough.  Gastrointestinal: No abdominal pain.  No nausea, no vomiting.  No diarrhea.  No constipation. Genitourinary: Negative for dysuria.  No urinary frequency. Musculoskeletal: Negative for back pain. Skin: Negative for rash. Neurological: Negative for headaches. No focal numbness, tingling.  Positive dizziness.  Positive generalized weakness.  No changes in mental status.  Positive slurred speech.  No visual changes.    ____________________________________________   PHYSICAL EXAM:  VITAL SIGNS: ED Triage Vitals  Enc Vitals Group     BP 10/05/17 1312 115/69     Pulse Rate 10/05/17 1312 75     Resp 10/05/17 1312 18     Temp 10/05/17 1312 97.9 F (36.6 C)     Temp Source 10/05/17 1312 Oral     SpO2 10/05/17 1312 95 %     Weight  10/05/17 1313 240 lb (108.9 kg)     Height 10/05/17 1313 5\' 6"  (1.676 m)     Head Circumference --      Peak Flow --      Pain Score 10/05/17 1312 0     Pain Loc --      Pain Edu? --      Excl. in [place]? --     Constitutional: Alert and oriented.  Answers questions appropriately.  Tonically ill-appearing. Eyes: Conjunctivae are normal.  EOMI. PERRLA no scleral icterus. Head: Atraumatic. Nose: No congestion/rhinnorhea. Mouth/Throat: Mucous membranes are moist.  Neck: No stridor.  Supple.  No JVD.  No meningismus. Cardiovascular: Normal rate, regular rhythm. No murmurs, rubs or gallops.  Respiratory: Normal respiratory effort.  No accessory muscle use or retractions. Lungs CTAB.  No wheezes, rales or ronchi. Gastrointestinal: Obese.  Soft, nontender and nondistended.  No guarding or rebound.  No peritoneal signs. Musculoskeletal: No LE edema. No ttp in the calves or palpable cords.  Negative Homan's sign. Neurologic: Alert and oriented 3. Speech is hard. Naming and repetition are intact. Face and smile symmetric. Tongue is midline.  EOMI.  PERRLA.  No horizontal or vertical nystagmus.  No pronator drift. 5 out of 5 grip, biceps, triceps, hip flexors, plantar flexion and dorsiflexion. Normal sensation to light touch in the bilateral upper and lower extremities, and face. Skin:  Skin is warm, dry and intact.  There is erythema noted to the right great toe.  No rash noted. Psychiatric: Mood and affect are normal.   ____________________________________________   LABS (all labs ordered are listed, but only abnormal results are displayed)  Labs Reviewed  CBC  COMPREHENSIVE METABOLIC PANEL  PROTIME-INR  APTT  URINALYSIS, COMPLETE (UACMP) WITH MICROSCOPIC  TROPONIN I   ____________________________________________  EKG  ED ECG REPORT I, Anne-Caroline Mariea Clonts, the attending physician, personally viewed and interpreted this ECG.   Date: 10/05/2017  EKG Time: 1318  Rate: 76  Rhythm:  normal sinus rhythm  Axis: leftward  Intervals:none and right bundle branch block  ST&T Change: Specific T wave inversion in V1.  No STEMI.  ____________________________________________  RADIOLOGY  Ct Head Wo Contrast  Result Date: 10/05/2017 CLINICAL DATA:  Concern for TIA. Slurred speech. Increased weakness and confusion. History of stroke. EXAM: CT HEAD WITHOUT CONTRAST TECHNIQUE: Contiguous axial images were obtained from the base of the skull through the vertex without intravenous contrast. COMPARISON:  09/19/2017; 01/29/2015; brain MRI-01/29/2015 FINDINGS: Brain: Similar findings of atrophy with sulcal prominence. Minimal periventricular hypodensities compatible with microvascular ischemic disease. No CT evidence of acute large territory infarct. No intraparenchymal or extra-axial mass or hemorrhage. Unchanged size and configuration of the ventricles and the  basilar cisterns. No midline shift. Vascular: No hyperdense vessel or unexpected calcification. Skull: No displaced calvarial fracture. Sinuses/Orbits: Limited visualization of the paranasal sinuses and mastoid air cells is normal. No air-fluid levels. Other: Regional soft tissues appear normal. IMPRESSION: Similar findings of atrophy and mild microvascular ischemic disease without superimposed acute intracranial process. Electronically Signed   By: Sandi Mariscal M.D.   On: 10/05/2017 13:38    ____________________________________________   PROCEDURES  Procedure(s) performed: None  Procedures  Critical Care performed: No ____________________________________________   INITIAL IMPRESSION / ASSESSMENT AND PLAN / ED COURSE  Pertinent labs & imaging results that were available during my care of the patient were reviewed by me and considered in my medical decision making (see chart for details).  62 y.o. female with a history of prior CVA presenting with 3 weeks of intermittent and now worsening slurred speech, dizziness, and  forgetfulness.  Overall, the patient is hemodynamically stable.  On my examination, she does have slurred speech without any other focal findings.  Her CT scan shows her old prior stroke but no evidence of new intracranial abnormality.  Fortunately, the patient is a loop monitor and is unable to undergo MRI imaging at this time.  I have spoken with Dr. Doy Mince, the neurologist on-call, and we will plan to admit the patient for stroke evaluation and continued monitoring of symptoms.  Laboratory studies are pending.  Other possible etiologies include UTI, or electrolyte abnormality, medication causes for exacerbation of her baseline stroke symptoms.  Prednisone could be a culprit however she did start this medication after the onset of slurred speech.  Infection or inflammation, including gout, may have exacerbated her symptoms. Plan admission.  ____________________________________________  FINAL CLINICAL IMPRESSION(S) / ED DIAGNOSES  Final diagnoses:  Slurred speech  Dizziness  Memory impairment         NEW MEDICATIONS STARTED DURING THIS VISIT:  New Prescriptions   No medications on file      Eula Listen, MD 10/05/17 1701

## 2017-10-05 NOTE — ED Triage Notes (Signed)
PT arrives with family with concerns over "a mini stroke." Family consulted PCP who wanted pt to be evaluated in the ED first. Pt arrives ambulatory and is alert and oriented x 4. Pt's voice appears clear in triage but per family is slurred. Pt reports increased weakness and confusion. Symptoms started the end of July and has progressively worsened. Face symmetrical, grips strong and equal. Pt denies any acute pain

## 2017-10-05 NOTE — ED Notes (Signed)
Spoke with MD Archie Balboa, verbal order for CT HEAD

## 2017-10-05 NOTE — H&P (Signed)
[place] [facility] [place] NAME: Jocelyn Wells    MR#:  [id number]  DATE OF BIRTH:  [birth year]  DATE OF ADMISSION:  10/05/2017  PRIMARY CARE PHYSICIAN: Chrismon, Vickki Muff, PA   REQUESTING/REFERRING PHYSICIAN: dr Mariea Clonts  CHIEF COMPLAINT:   Slurred speech HISTORY OF PRESENT ILLNESS:  Jocelyn Wells  is a 62 y.o. female with a known history of diabetes, COPD, cryptogenic stroke and anxiety and depression who presents to the emergency room due to slurred speech and dizziness.   Patient reports that approximately 3 weeks ago while she was increased she developed sudden onset of slurred speech and right-sided weakness.  Since that time she continues to have slurred speech, confusion and right-sided weakness. She saw her primary care physician after she came back from her cruise and they ordered a CT of the head which essentially was unremarkable.   She was brought to the hospital today because her symptoms have progressively worsened over the course of the past 2 days.  In the emergency room she had a repeat CT scan of the head which was essentially unremarkable.  She continues to have right-sided weakness and mild slurred speech. Her thought process is very slow which the husband reports is unusual for her.  PAST MEDICAL HISTORY:   Past Medical History:  Diagnosis Date  . Anxiety   . Cardiomyopathy [facility])    new to her Jan 2017  . COPD (chronic obstructive pulmonary disease) ([place])   . Depression   . Diabetes mellitus, type II ([place])   . HTN (hypertension)   . Obesity   . PONV (postoperative nausea and vomiting)   . Stroke [facility])    Jan 2017    PAST SURGICAL HISTORY:   Past Surgical History:  Procedure Laterality Date  . ABDOMINAL HYSTERECTOMY    . ANKLE FRACTURE SURGERY Left 2002  . BILATERAL SALPINGOOPHORECTOMY  2000  . CARDIAC CATHETERIZATION N/A 03/25/2015   Procedure: Right/Left Heart Cath and Coronary Angiography;  Surgeon: Belva Crome,  MD;  Location: [place] [facility];  Service: Cardiovascular;  Laterality: N/A;  . [place]  . COLONOSCOPY WITH PROPOFOL N/A 09/22/2016   Procedure: COLONOSCOPY WITH PROPOFOL;  Surgeon: Manya Silvas, MD;  Location: [facility] ENDOSCOPY;  Service: Endoscopy;  Laterality: N/A;  . EP IMPLANTABLE DEVICE N/A 08/05/2015   Procedure: Loop Recorder Insertion;  Surgeon: Deboraha Sprang, MD;  Location: [place] [facility];  Service: Cardiovascular;  Laterality: N/A;  . ESOPHAGOGASTRODUODENOSCOPY (EGD) WITH PROPOFOL N/A 09/22/2016   Procedure: ESOPHAGOGASTRODUODENOSCOPY (EGD) WITH PROPOFOL;  Surgeon: Manya Silvas, MD;  Location: [facility] ENDOSCOPY;  Service: Endoscopy;  Laterality: N/A;  . ESOPHAGOGASTRODUODENOSCOPY (EGD) WITH PROPOFOL N/A 12/08/2016   Procedure: ESOPHAGOGASTRODUODENOSCOPY (EGD) WITH PROPOFOL;  Surgeon: Manya Silvas, MD;  Location: [facility] ENDOSCOPY;  Service: Endoscopy;  Laterality: N/A;  . KNEE ARTHROSCOPY Left 2005  . TEE WITHOUT CARDIOVERSION N/A 01/31/2015   Procedure: TRANSESOPHAGEAL ECHOCARDIOGRAM (TEE);  Surgeon: Lelon Perla, MD;  Location: [place];  Service: Cardiovascular;  Laterality: N/A;  . [place]  . ULNAR NERVE TRANSPOSITION  2008    SOCIAL HISTORY:   Social History   Tobacco Use  . Smoking status: Current Every Day Smoker    Packs/day: 1.00    Years: 35.00    Pack years: 35.00    Types: Cigarettes    Start date: 02/08/1974  . Smokeless tobacco: Never Used  . Tobacco comment: "I can not quit  smoking, I have tried". 1 PPD  Substance Use Topics  . Alcohol use: No    Alcohol/week: 0.0 standard drinks    FAMILY HISTORY:   Family History  Problem Relation Age of Onset  . Heart disease Mother        died from CHF  . Asthma Mother   . Diabetes Mother   . Heart disease Father   . Aneurysm Father   . COPD Brother   . Diabetes Brother   . Alcohol abuse Paternal Aunt   . Anemia Neg Hx   . Arrhythmia Neg Hx   . Clotting  disorder Neg Hx   . Fainting Neg Hx   . Heart attack Neg Hx   . Heart failure Neg Hx   . Hyperlipidemia Neg Hx   . Hypertension Neg Hx     DRUG ALLERGIES:   Allergies  Allergen Reactions  . Codeine Nausea And Vomiting    REVIEW OF SYSTEMS:   Review of Systems  Constitutional: Negative for chills, fever and malaise/fatigue.  HENT: Negative.  Negative for ear discharge, ear pain, hearing loss, nosebleeds and sore throat.   Eyes: Negative.  Negative for blurred vision and pain.  Respiratory: Negative.  Negative for cough, hemoptysis, shortness of breath and wheezing.   Cardiovascular: Negative.  Negative for chest pain, palpitations and leg swelling.  Gastrointestinal: Negative.  Negative for abdominal pain, blood in stool, diarrhea, nausea and vomiting.  Genitourinary: Negative.  Negative for dysuria.  Musculoskeletal: Negative.  Negative for back pain.  Skin: Negative.   Neurological: Positive for dizziness, speech change and weakness. Negative for tremors, focal weakness, seizures, loss of consciousness and headaches.       Increased confusion  Endo/Heme/Allergies: Negative.  Does not bruise/bleed easily.  Psychiatric/Behavioral: Negative.  Negative for depression, hallucinations and suicidal ideas.    MEDICATIONS AT HOME:   Prior to Admission medications   Medication Sig Start Date End Date Taking? Authorizing Provider  aspirin 325 MG tablet Take 1 tablet (325 mg total) by mouth daily. 01/31/15  Yes Elgergawy, Silver Huguenin, MD  atorvastatin (LIPITOR) 10 MG tablet TAKE 1 TABLET (10 MG TOTAL) BY MOUTH DAILY. 04/29/17  Yes Chrismon, Vickki Muff, PA  BYDUREON 2 MG PEN Inject 2 mg into the skin once a week. SATURDAY 08/26/15  Yes [provider]  carvedilol (COREG) 12.5 MG tablet TAKE 1 TABLET (12.5 MG TOTAL) BY MOUTH 2 (TWO) TIMES DAILY. 05/23/17  Yes Deboraha Sprang, MD  diclofenac (VOLTAREN) 75 MG EC tablet TAKE 1 TABLET BY MOUTH TWICE A DAY AS NEEDED 04/01/17  Yes Chrismon,  Vickki Muff, PA  gabapentin (NEURONTIN) 300 MG capsule Take 300 mg by mouth daily.  08/10/17 08/10/18 Yes [provider]  glyBURIDE-metformin (GLUCOVANCE) 5-500 MG per tablet Take 2 tablets by mouth 2 (two) times daily.    Yes [provider]  loratadine (CLARITIN) 10 MG tablet Take 1 tablet by mouth daily.   Yes [provider]  losartan (COZAAR) 50 MG tablet TAKE 1 TABLET BY MOUTH EVERY DAY 10/03/17  Yes Chrismon, Vickki Muff, PA  NOVOLIN 70/30 RELION (70-30) 100 UNIT/ML injection Inject 40-70 Units into the skin 2 (two) times daily with a meal. 40units-AM,  70UNITS-PM 10/06/15  Yes [provider]  PARoxetine (PAXIL) 40 MG tablet Take 1 tablet (40 mg total) by mouth daily. 07/11/17  Yes Rainey Pines, MD  spironolactone (ALDACTONE) 25 MG tablet Take 12.5 mg by mouth daily.    Yes [provider]  B-D INS SYRINGE 0.5CC/31GX5/16 31G X 5/16" 0.5 ML MISC  02/23/16   [provider]      VITAL SIGNS:  Blood pressure 115/69, pulse 75, temperature 97.9 F (36.6 C), temperature source Oral, resp. rate 18, height 5\' 6"  (1.676 m), weight 108.9 kg, SpO2 95 %.  PHYSICAL EXAMINATION:   Physical Exam  Constitutional: She is oriented to person, place, and time. No distress.  HENT:  Head: Normocephalic.  Eyes: No scleral icterus.  Neck: Normal range of motion. Neck supple. No JVD present. No tracheal deviation present.  Cardiovascular: Normal rate, regular rhythm and normal heart sounds. Exam reveals no gallop and no friction rub.  No murmur heard. Pulmonary/Chest: Effort normal and breath sounds normal. No respiratory distress. She has no wheezes. She has no rales. She exhibits no tenderness.  Abdominal: Soft. Bowel sounds are normal. She exhibits no distension and no mass. There is no tenderness. There is no rebound and no guarding.  Musculoskeletal: Normal range of motion. She exhibits no edema.  Neurological: She is alert and oriented to person, place, and  time. She displays normal reflexes. No cranial nerve deficit or sensory deficit. She exhibits normal muscle tone. Coordination normal.  Some mild slurred speech   Skin: Skin is warm. No rash noted. No erythema.  Psychiatric: Judgment normal.      LABORATORY PANEL:   CBC Recent Labs  Lab 10/05/17 1738  WBC 9.5  HGB 11.2*  HCT 34.4*  PLT 181   ------------------------------------------------------------------------------------------------------------------  Chemistries  Recent Labs  Lab 10/05/17 1738  NA 136  K 4.8  CL 100  CO2 27  GLUCOSE 317*  BUN 22  CREATININE 1.70*  CALCIUM 8.4*  AST 18  ALT 15  ALKPHOS 133*  BILITOT 0.6   ------------------------------------------------------------------------------------------------------------------  Cardiac Enzymes Recent Labs  Lab 10/05/17 1738  TROPONINI <0.03   ------------------------------------------------------------------------------------------------------------------  RADIOLOGY:  Ct Head Wo Contrast  Result Date: 10/05/2017 CLINICAL DATA:  Concern for TIA. Slurred speech. Increased weakness and confusion. History of stroke. EXAM: CT HEAD WITHOUT CONTRAST TECHNIQUE: Contiguous axial images were obtained from the base of the skull through the vertex without intravenous contrast. COMPARISON:  09/19/2017; 01/29/2015; brain MRI-01/29/2015 FINDINGS: Brain: Similar findings of atrophy with sulcal prominence. Minimal periventricular hypodensities compatible with microvascular ischemic disease. No CT evidence of acute large territory infarct. No intraparenchymal or extra-axial mass or hemorrhage. Unchanged size and configuration of the ventricles and the basilar cisterns. No midline shift. Vascular: No hyperdense vessel or unexpected calcification. Skull: No displaced calvarial fracture. Sinuses/Orbits: Limited visualization of the paranasal sinuses and mastoid air cells is normal. No air-fluid levels. Other: Regional soft  tissues appear normal. IMPRESSION: Similar findings of atrophy and mild microvascular ischemic disease without superimposed acute intracranial process. Electronically Signed   By: Sandi Mariscal M.D.   On: 10/05/2017 13:38    EKG:   Normal sinus rhythm no ST elevation or depression IMPRESSION AND PLAN:   62 year old female with history of uncontrolled diabetes and CVA in the past who presents with slurred speech  1.  Acute CVA with slurred speech which is resolving: Continue telemetry monitoring CVA work-up including MRI and MRA of the head, carotid ultrasound and echocardiogram Start aspirin 81 mg and Plavix 75 mg daily Continue statin Check lipid panel and A1c Neurology consultation requested PT, OT and speech consultation requested  2.  Uncontrolled diabetes: Sliding scale and ADA diet Continue long-acting insulin Hold oral medications for now Diabetes nurse consultation requested  3.  Hyperlipidemia: Continue  statin and check lipid panel  4.  Essential hypertension: Continue Cozaar, Coreg and Aldactone since patient symptoms are resolving  5.  Depression/anxiety: Continue Paxil  6. Tobacco dependence: Patient is encouraged to quit smoking. Counseling was provided for 4 minutes.   All the records are reviewed and case discussed with ED provider. Management plans discussed with the patient and she is in agreement  CODE STATUS: FULL  TOTAL TIME TAKING CARE OF THIS PATIENT: 45 minutes.    Anglia Blakley M.D on 10/05/2017 at 6:32 PM  Between 7am to 6pm - Pager - [phone]  After 6pm go to [email or web address] - password [email or web address] [facility]  Office  [phone]  CC: Primary care physician; Chrismon, Vickki Muff, PA

## 2017-10-06 ENCOUNTER — Inpatient Hospital Stay: Payer: Medicare Other

## 2017-10-06 ENCOUNTER — Inpatient Hospital Stay (HOSPITAL_BASED_OUTPATIENT_CLINIC_OR_DEPARTMENT_OTHER)
Admission: EM | Admit: 2017-10-06 | Discharge: 2017-10-06 | Disposition: A | Discharge status: Home / Self Care | Payer: Medicare Other | Source: Home / Self Care | Attending: Internal Medicine | Admitting: Internal Medicine

## 2017-10-06 DIAGNOSIS — F419 Anxiety disorder, unspecified: Secondary | ICD-10-CM | POA: Diagnosis not present

## 2017-10-06 DIAGNOSIS — I6523 Occlusion and stenosis of bilateral carotid arteries: Secondary | ICD-10-CM | POA: Diagnosis not present

## 2017-10-06 DIAGNOSIS — F329 Major depressive disorder, single episode, unspecified: Secondary | ICD-10-CM | POA: Diagnosis not present

## 2017-10-06 DIAGNOSIS — J449 Chronic obstructive pulmonary disease, unspecified: Secondary | ICD-10-CM | POA: Diagnosis not present

## 2017-10-06 DIAGNOSIS — G459 Transient cerebral ischemic attack, unspecified: Secondary | ICD-10-CM | POA: Diagnosis not present

## 2017-10-06 DIAGNOSIS — E1165 Type 2 diabetes mellitus with hyperglycemia: Secondary | ICD-10-CM | POA: Diagnosis not present

## 2017-10-06 DIAGNOSIS — Z716 Tobacco abuse counseling: Secondary | ICD-10-CM | POA: Diagnosis not present

## 2017-10-06 DIAGNOSIS — I10 Essential (primary) hypertension: Secondary | ICD-10-CM | POA: Diagnosis not present

## 2017-10-06 DIAGNOSIS — I361 Nonrheumatic tricuspid (valve) insufficiency: Secondary | ICD-10-CM | POA: Diagnosis not present

## 2017-10-06 DIAGNOSIS — R4781 Slurred speech: Secondary | ICD-10-CM | POA: Diagnosis not present

## 2017-10-06 DIAGNOSIS — R42 Dizziness and giddiness: Secondary | ICD-10-CM | POA: Diagnosis not present

## 2017-10-06 DIAGNOSIS — Z8673 Personal history of transient ischemic attack (TIA), and cerebral infarction without residual deficits: Secondary | ICD-10-CM | POA: Diagnosis not present

## 2017-10-06 LAB — URINALYSIS, COMPLETE (UACMP) WITH MICROSCOPIC
BACTERIA UA: NONE SEEN
BILIRUBIN URINE: NEGATIVE
Glucose, UA: >=500 mg/dL — AB
Hgb urine dipstick: NEGATIVE
Ketones, ur: NEGATIVE mg/dL
Nitrite: NEGATIVE
Protein, ur: 30 mg/dL — AB
Specific Gravity, Urine: 1.014 (ref 1.005–1.030)
pH: 5 (ref 5.0–8.0)

## 2017-10-06 LAB — HEMOGLOBIN A1C
HEMOGLOBIN A1C: 12.2 % — AB (ref 4.8–5.6)
MEAN PLASMA GLUCOSE: 303.44 mg/dL

## 2017-10-06 LAB — GLUCOSE, CAPILLARY
GLUCOSE-CAPILLARY: 239 mg/dL — AB (ref 70–99)
Glucose-Capillary: 305 mg/dL — ABNORMAL HIGH (ref 70–99)

## 2017-10-06 LAB — LIPID PANEL
CHOL/HDL RATIO: 5 ratio
Cholesterol: 190 mg/dL (ref 0–200)
HDL: 38 mg/dL — AB (ref >40)
LDL CALC: 88 mg/dL (ref 0–99)
Triglycerides: 322 mg/dL — ABNORMAL HIGH (ref <150)
VLDL: 64 mg/dL — ABNORMAL HIGH (ref 0–40)

## 2017-10-06 MED ORDER — ATORVASTATIN CALCIUM 40 MG PO TABS: 40.0000 mg | ORAL_TABLET | Freq: Every day | ORAL | 2 refills | Status: DC

## 2017-10-06 MED ORDER — CLOPIDOGREL BISULFATE 75 MG PO TABS: 75.0000 mg | ORAL_TABLET | Freq: Every day | ORAL | 2 refills | Status: DC

## 2017-10-06 MED ORDER — ENOXAPARIN SODIUM 40 MG/0.4ML ~~LOC~~ SOLN: 40.0000 mg | SUBCUTANEOUS | Status: DC

## 2017-10-06 NOTE — Consult Note (Signed)
Referring Physician: Bettey Costa, MD    Chief Complaint: Dizziness, slurred speech, and gait difficulties  HPI: Jocelyn Wells is an 62 y.o. female with pertinent history of uncontrolled DM, strokes, post stroke cognitive difficulties, HTN, major depressive disorder, and  HLD who present to the ED with worsening symptoms of slurred speech, dizziness and generalized weakness causing gait difficulty. She report that they went on a cruise end of July to beginning of August and that is when her husband noticed that she was not acting her normal self. Per husband, her speech was slurred and she was having word finding difficulty and right-sided weakness which has persisted. Husband brought her to the ED yesterday because her symptoms seem to have worsened especially in the last 3 weeks. She was also complaining of dizziness without associated symptoms of altered sensorium, cranial nerve deficit, seizures, focal motor or sensory deficits, diplopia, syncope or LOC. She endorses paresthesia (numbness, tingling, pins-and-needles sensation) in her bilateral lower extremities at baseline likely diabetes induced peripheral neuropathy. She has had similar presentation with right side weakness and numbness in 2016 and was found to have small left MCA branch infarct involving posterior left frontal lobe and insular cortex on MRI brain. Due to her prior history and significant stroke risk factors, she was admitted for further stroke work up and management. Patient state she has seen a Neurologist in the past for her symptoms. She also follows with her cardiologist Dr. Caryl Comes who has implanted a loop recorder device. She has gait difficulty at baseline and uses a cane. She report that husband bought a rolling scooter as well. She denies  Past Medical History:  Diagnosis Date  . Anxiety   . Cardiomyopathy [facility])    new to her Jan 2017  . COPD (chronic obstructive pulmonary disease) ([place])   . Depression   . Diabetes  mellitus, type II ([place])   . HTN (hypertension)   . Obesity   . PONV (postoperative nausea and vomiting)   . Stroke [facility])    Jan 2017    Past Surgical History:  Procedure Laterality Date  . ABDOMINAL HYSTERECTOMY    . ANKLE FRACTURE SURGERY Left 2002  . BILATERAL SALPINGOOPHORECTOMY  2000  . CARDIAC CATHETERIZATION N/A 03/25/2015   Procedure: Right/Left Heart Cath and Coronary Angiography;  Surgeon: Belva Crome, MD;  Location: [place] CV LAB;  Service: Cardiovascular;  Laterality: N/A;  . [place]  . COLONOSCOPY WITH PROPOFOL N/A 09/22/2016   Procedure: COLONOSCOPY WITH PROPOFOL;  Surgeon: Manya Silvas, MD;  Location: [facility] ENDOSCOPY;  Service: Endoscopy;  Laterality: N/A;  . EP IMPLANTABLE DEVICE N/A 08/05/2015   Procedure: Loop Recorder Insertion;  Surgeon: Deboraha Sprang, MD;  Location: [place] CV LAB;  Service: Cardiovascular;  Laterality: N/A;  . ESOPHAGOGASTRODUODENOSCOPY (EGD) WITH PROPOFOL N/A 09/22/2016   Procedure: ESOPHAGOGASTRODUODENOSCOPY (EGD) WITH PROPOFOL;  Surgeon: Manya Silvas, MD;  Location: [facility] ENDOSCOPY;  Service: Endoscopy;  Laterality: N/A;  . ESOPHAGOGASTRODUODENOSCOPY (EGD) WITH PROPOFOL N/A 12/08/2016   Procedure: ESOPHAGOGASTRODUODENOSCOPY (EGD) WITH PROPOFOL;  Surgeon: Manya Silvas, MD;  Location: [facility] ENDOSCOPY;  Service: Endoscopy;  Laterality: N/A;  . KNEE ARTHROSCOPY Left 2005  . TEE WITHOUT CARDIOVERSION N/A 01/31/2015   Procedure: TRANSESOPHAGEAL ECHOCARDIOGRAM (TEE);  Surgeon: Lelon Perla, MD;  Location: [place];  Service: Cardiovascular;  Laterality: N/A;  . [place]  . ULNAR NERVE TRANSPOSITION  2008    Family History  Problem Relation Age of Onset  .  Heart disease Mother        died from CHF  . Asthma Mother   . Diabetes Mother   . Heart disease Father   . Aneurysm Father   . COPD Brother   . Diabetes Brother   . Alcohol abuse Paternal Aunt   . Anemia Neg Hx   . Arrhythmia Neg Hx    . Clotting disorder Neg Hx   . Fainting Neg Hx   . Heart attack Neg Hx   . Heart failure Neg Hx   . Hyperlipidemia Neg Hx   . Hypertension Neg Hx    Social History:  reports that she has been smoking cigarettes. She started smoking about 43 years ago. She has a 35.00 pack-year smoking history. She has never used smokeless tobacco. She reports that she does not drink alcohol or use drugs.  Allergies:  Allergies  Allergen Reactions  . Codeine Nausea And Vomiting    Medications:  I have reviewed the patient's current medications. Scheduled: . aspirin  81 mg Oral Daily  . atorvastatin  10 mg Oral Daily  . carvedilol  12.5 mg Oral BID WC  . clopidogrel  75 mg Oral Daily  . enoxaparin (LOVENOX) injection  40 mg Subcutaneous Q24H  . gabapentin  300 mg Oral Daily  . insulin aspart  0-15 Units Subcutaneous TID WC  . insulin aspart  0-5 Units Subcutaneous QHS  . insulin aspart protamine- aspart  40 Units Subcutaneous BID WC  . loratadine  10 mg Oral Daily  . losartan  50 mg Oral Daily  . PARoxetine  40 mg Oral Daily  . spironolactone  12.5 mg Oral Daily   Prior to Admission medications   Medication Sig Start Date End Date Taking? Authorizing Provider  aspirin 325 MG tablet Take 1 tablet (325 mg total) by mouth daily. 01/31/15  Yes Elgergawy, Silver Huguenin, MD  BYDUREON 2 MG PEN Inject 2 mg into the skin once a week. SATURDAY 08/26/15  Yes [provider]  carvedilol (COREG) 12.5 MG tablet TAKE 1 TABLET (12.5 MG TOTAL) BY MOUTH 2 (TWO) TIMES DAILY. 05/23/17  Yes Deboraha Sprang, MD  diclofenac (VOLTAREN) 75 MG EC tablet TAKE 1 TABLET BY MOUTH TWICE A DAY AS NEEDED 04/01/17  Yes Chrismon, Vickki Muff, PA  gabapentin (NEURONTIN) 300 MG capsule Take 300 mg by mouth daily.  08/10/17 08/10/18 Yes [provider]  glyBURIDE-metformin (GLUCOVANCE) 5-500 MG per tablet Take 2 tablets by mouth 2 (two) times daily.    Yes [provider]  loratadine (CLARITIN) 10 MG tablet Take 1  tablet by mouth daily.   Yes [provider]  losartan (COZAAR) 50 MG tablet TAKE 1 TABLET BY MOUTH EVERY DAY 10/03/17  Yes Chrismon, Vickki Muff, PA  NOVOLIN 70/30 RELION (70-30) 100 UNIT/ML injection Inject 40-70 Units into the skin 2 (two) times daily with a meal. 40units-AM,  70UNITS-PM 10/06/15  Yes [provider]  PARoxetine (PAXIL) 40 MG tablet Take 1 tablet (40 mg total) by mouth daily. 07/11/17  Yes Rainey Pines, MD  spironolactone (ALDACTONE) 25 MG tablet Take 12.5 mg by mouth daily.    Yes [provider]  atorvastatin (LIPITOR) 40 MG tablet Take 1 tablet (40 mg total) by mouth daily. 10/06/17   Gladstone Lighter, MD  B-D INS SYRINGE 0.5CC/31GX5/16 31G X 5/16" 0.5 ML MISC  02/23/16   [provider]  clopidogrel (PLAVIX) 75 MG tablet Take 1 tablet (75 mg total) by mouth daily. 10/07/17  Gladstone Lighter, MD    ROS: History obtained from the patient   General ROS: negative for - chills, fatigue, fever, night sweats, weight gain or weight loss Psychological ROS: negative for - behavioral disorder, hallucinations, or suicidal ideation. Positive for memory difficulties, mood disorder, anxiety Ophthalmic ROS: negative for - blurry vision, double vision, eye pain or loss of vision ENT ROS: negative for - epistaxis, nasal discharge, oral lesions, sore throat, tinnitus or vertigo Allergy and Immunology ROS: negative for - hives or itchy/watery eyes Hematological and Lymphatic ROS: negative for - bleeding problems, bruising or swollen lymph nodes Endocrine ROS: negative for - galactorrhea, hair pattern changes, polydipsia/polyuria or temperature intolerance Respiratory ROS: negative for - cough, hemoptysis, shortness of breath or wheezing Cardiovascular ROS: negative for - chest pain, dyspnea on exertion, edema or irregular heartbeat Gastrointestinal ROS: negative for - abdominal pain, diarrhea, hematemesis, nausea/vomiting or stool incontinence Genito-Urinary  ROS: negative for - dysuria, hematuria, incontinence or urinary frequency/urgency Musculoskeletal ROS: negative for - joint swelling or muscular weakness Neurological ROS: as noted in HPI Dermatological ROS: negative for rash and skin lesion changes  Physical Examination: Blood pressure (!) 141/69, pulse 67, temperature 98 F (36.7 C), temperature source Oral, resp. rate 18, height 5\' 6"  (1.676 m), weight 106.1 kg, SpO2 92 %.  ? Neurological Exam  Mental Status: Alert, Oriented to time, place, person and situation Attention span and concentration seemed appropriate Memory seemed OK. Intact naming, repetition, comprehension.  Followed 2 step commands - mild dysarthria Fund of knowledge seemed appropriate for age and health status.  Cranial Nerves: I. Olfactory not examined II: Visual fields were full. Pupils were equal, round and reactive to light and accommodation III,IV, VI: ptosis not present, extra-ocular motions intact bilaterally V,VII: smile symmetric, facial light touch sensation normal bilaterally VIII: Finger rub was heard symmetric in both ears IX, X: Palate and uvular movements are normal and oral sensations are OK, gag reflex deffered XI: Neck muscle strength and shoulder shrug is normal XII: midline tongue extension  Motor Exam: Tone is normal in all extremities Muscle strength in all extremities is 5/5. No abnormal movements, fasciculations or atrophy seen  Deep Tendon Reflexes: Right Biceps is 2+, Left Biceps is 2+ Right Triceps is 2+, Left Triceps is 2+ Right Brachioradialis is 2+, Left Brachioradialis is 2+ Right Knee Jerk is 2+, Left Knee Jerk is 2+ Right Ankle Jerk is 2+, Left Ankle Jerk is 2+ Right Toes are down going, Left Toes are down going  Sensory Exam: Sensations were decreased to light touch in all extremities lowers>uppers Vibration and proprioception are also decreased   Co-ordination: Finger to nose is abnormal on the left  Gait: Gait  and station are slow and unsteady when examined in small exam room, using a walker Romberg's test not performed, she is very unsteady on her feet.   Data Reviewed  Laboratory Studies:  Basic Metabolic Panel: Recent Labs  Lab 10/05/17 1738  NA 136  K 4.8  CL 100  CO2 27  GLUCOSE 317*  BUN 22  CREATININE 1.70*  CALCIUM 8.4*    Liver Function Tests: Recent Labs  Lab 10/05/17 1738  AST 18  ALT 15  ALKPHOS 133*  BILITOT 0.6  PROT 6.5  ALBUMIN 3.2*   No results for input(s): LIPASE, AMYLASE in the last 168 hours. No results for input(s): AMMONIA in the last 168 hours.  CBC: Recent Labs  Lab 10/05/17 1738  WBC 9.5  HGB 11.2*  HCT 34.4*  MCV  84.5  PLT 181    Cardiac Enzymes: Recent Labs  Lab 10/05/17 1738  TROPONINI <0.03    BNP: Invalid input(s): POCBNP  CBG: Recent Labs  Lab 10/05/17 1916 10/05/17 2134 10/06/17 0749  GLUCAP 357* 277* 239*    Microbiology: No results found for this or any previous visit.  Coagulation Studies: Recent Labs    10/05/17 1828  LABPROT 12.4  INR 0.93    Urinalysis:  Recent Labs  Lab 10/06/17 0425  COLORURINE YELLOW*  LABSPEC 1.014  PHURINE 5.0  GLUCOSEU >=500*  HGBUR NEGATIVE  BILIRUBINUR NEGATIVE  KETONESUR NEGATIVE  PROTEINUR 30*  NITRITE NEGATIVE  LEUKOCYTESUR TRACE*    Lipid Panel:    Component Value Date/Time   CHOL 190 10/06/2017 0330   TRIG 322 (H) 10/06/2017 0330   HDL 38 (L) 10/06/2017 0330   CHOLHDL 5.0 10/06/2017 0330   VLDL 64 (H) 10/06/2017 0330   LDLCALC 88 10/06/2017 0330    HgbA1C:  Lab Results  Component Value Date   HGBA1C 15.1 03/02/2017    Urine Drug Screen:  No results found for: LABOPIA, COCAINSCRNUR, LABBENZ, AMPHETMU, THCU, LABBARB  Alcohol Level: No results for input(s): ETH in the last 168 hours.  Other results: EKG: normal EKG, normal sinus rhythm, unchanged from previous tracings.  Imaging: Ct Head Wo Contrast  Result Date: 10/05/2017 CLINICAL DATA:   Concern for TIA. Slurred speech. Increased weakness and confusion. History of stroke. EXAM: CT HEAD WITHOUT CONTRAST TECHNIQUE: Contiguous axial images were obtained from the base of the skull through the vertex without intravenous contrast. COMPARISON:  09/19/2017; 01/29/2015; brain MRI-01/29/2015 FINDINGS: Brain: Similar findings of atrophy with sulcal prominence. Minimal periventricular hypodensities compatible with microvascular ischemic disease. No CT evidence of acute large territory infarct. No intraparenchymal or extra-axial mass or hemorrhage. Unchanged size and configuration of the ventricles and the basilar cisterns. No midline shift. Vascular: No hyperdense vessel or unexpected calcification. Skull: No displaced calvarial fracture. Sinuses/Orbits: Limited visualization of the paranasal sinuses and mastoid air cells is normal. No air-fluid levels. Other: Regional soft tissues appear normal. IMPRESSION: Similar findings of atrophy and mild microvascular ischemic disease without superimposed acute intracranial process. Electronically Signed   By: Sandi Mariscal M.D.   On: 10/05/2017 13:38   Mr Brain Wo Contrast  Result Date: 10/06/2017 CLINICAL DATA:  62 y/o F; slurred speech, dizziness, right-sided weakness. EXAM: MRI HEAD WITHOUT CONTRAST MRA HEAD WITHOUT CONTRAST TECHNIQUE: Multiplanar, multiecho pulse sequences of the brain and surrounding structures were obtained without intravenous contrast. Angiographic images of the head were obtained using MRA technique without contrast. COMPARISON:  01/28/2018 CTA, CT, and MRI of the head. 10/05/2017 CT head. FINDINGS: MRI HEAD FINDINGS Brain: No acute infarction, hemorrhage, hydrocephalus, extra-axial collection or mass lesion. Several nonspecific T2 FLAIR hyperintensities in subcortical and periventricular white matter are compatible with mild chronic microvascular ischemic progressed from 2016. Mild volume loss of the brain. Vascular: As below. Skull and upper  cervical spine: Normal marrow signal. Sinuses/Orbits: Moderate left maxillary and anterior ethmoid air cell mucosal thickening. No significant abnormal signal of the mastoid air cells. Orbits are unremarkable. Other: None. MRA HEAD FINDINGS Internal carotid arteries:  Patent. Anterior cerebral arteries:  Patent. Middle cerebral arteries: Patent. Anterior communicating artery: Patent. Posterior communicating arteries:  Patent. Posterior cerebral arteries:  Patent. Basilar artery:  Patent. Vertebral arteries:  Patent. No evidence of high-grade stenosis, large vessel occlusion, or aneurysm. IMPRESSION: 1. No acute intracranial abnormality. 2. Patent anterior and posterior intracranial circulation. No large vessel  occlusion, aneurysm, or significant stenosis is identified. 3. Mild chronic microvascular ischemic changes and volume loss of the brain is progressed from 2016. 4. Moderate left maxillary and left anterior ethmoid mucosal thickening. Electronically Signed   By: Kristine Garbe M.D.   On: 10/06/2017 06:07   Mr Jodene Nam Head/brain WN Cm  Result Date: 10/06/2017 CLINICAL DATA:  62 y/o F; slurred speech, dizziness, right-sided weakness. EXAM: MRI HEAD WITHOUT CONTRAST MRA HEAD WITHOUT CONTRAST TECHNIQUE: Multiplanar, multiecho pulse sequences of the brain and surrounding structures were obtained without intravenous contrast. Angiographic images of the head were obtained using MRA technique without contrast. COMPARISON:  01/28/2018 CTA, CT, and MRI of the head. 10/05/2017 CT head. FINDINGS: MRI HEAD FINDINGS Brain: No acute infarction, hemorrhage, hydrocephalus, extra-axial collection or mass lesion. Several nonspecific T2 FLAIR hyperintensities in subcortical and periventricular white matter are compatible with mild chronic microvascular ischemic progressed from 2016. Mild volume loss of the brain. Vascular: As below. Skull and upper cervical spine: Normal marrow signal. Sinuses/Orbits: Moderate left  maxillary and anterior ethmoid air cell mucosal thickening. No significant abnormal signal of the mastoid air cells. Orbits are unremarkable. Other: None. MRA HEAD FINDINGS Internal carotid arteries:  Patent. Anterior cerebral arteries:  Patent. Middle cerebral arteries: Patent. Anterior communicating artery: Patent. Posterior communicating arteries:  Patent. Posterior cerebral arteries:  Patent. Basilar artery:  Patent. Vertebral arteries:  Patent. No evidence of high-grade stenosis, large vessel occlusion, or aneurysm. IMPRESSION: 1. No acute intracranial abnormality. 2. Patent anterior and posterior intracranial circulation. No large vessel occlusion, aneurysm, or significant stenosis is identified. 3. Mild chronic microvascular ischemic changes and volume loss of the brain is progressed from 2016. 4. Moderate left maxillary and left anterior ethmoid mucosal thickening. Electronically Signed   By: Kristine Garbe M.D.   On: 10/06/2017 06:07    Patient seen and examined.  Clinical course and management discussed.  Necessary edits performed.  I agree with the above.  Assessment and plan of care developed and discussed below.     Assessment: 62 y.o. female presenting with worsening slurred speech, dizziness, right side weakness and gait difficulty.  Patient may very well have had a TIA leading to presentation but has had a progressive decline over the past 6 weeks or so that may be due to her poor risk factor management. CT head, MRI/MRA head personally reviewed and shows no acute intracranial abnormality or large vessel occlusion/stenosis.  Prior transesophageal echocardiogram done on 01/31/2015 showed no definite thrombosis, PFO but showed severe global reduction in the left ventricular function.  She has a loop recorder interrogated last on 08/07/2017  and did not show occult arrhthymias or new AF. Hemoglobin A1c 12.2.  LDL cholesterol 88 mg/dl. She was taking Aspirin 325 mg and Atorvastatin 10 mg  prior to the event.  Stroke Risk Factors - diabetes mellitus, hyperlipidemia, hypertension and smoking  Plan: 1. PT consult, OT consult, Speech consult 2. Echocardiogram with bubble study pending, will need her loop recorder interrogated for AF 3.Change medical management to Plavix 75 mg /day with intensive management of vascular risk factor to keep systolic BP (SBP) <462 mm Hg (130 mm Hg if diabetic) and low density lipoprotein (LDL) <70 mg/dl, and lifestyle modification. Continue high potency statin. 4. NPO until RN stroke swallow screen 5. Telemetry monitoring 6. Frequent neuro checks   This patient was staffed with Dr. Magda Paganini, Doy Mince who personally evaluated patient, reviewed documentation and agreed with assessment and plan of care as above.  Rufina Falco, DNP, FNP-BC  Board certified Nurse Practitioner Neurology Department  10/06/2017, 8:20 AM   Alexis Goodell, MD Neurology [phone]  10/06/2017  1:42 PM

## 2017-10-06 NOTE — Plan of Care (Signed)
  Problem: Education: Goal: Knowledge of disease or condition will improve Outcome: Progressing Goal: Knowledge of secondary prevention will improve Outcome: Progressing Goal: Knowledge of patient specific risk factors addressed and post discharge goals established will improve Outcome: Progressing   Problem: Coping: Goal: Will verbalize positive feelings about self Outcome: Progressing   Problem: Health Behavior/Discharge Planning: Goal: Ability to manage health-related needs will improve Outcome: Progressing   Problem: Self-Care: Goal: Ability to participate in self-care as condition permits will improve Outcome: Progressing   Problem: Nutrition: Goal: Risk of aspiration will decrease Outcome: Progressing   Problem: Ischemic Stroke/TIA Tissue Perfusion: Goal: Complications of ischemic stroke/TIA will be minimized Outcome: Progressing   Problem: Health Behavior/Discharge Planning: Goal: Ability to manage health-related needs will improve Outcome: Progressing   Problem: Clinical Measurements: Goal: Ability to maintain clinical measurements within normal limits will improve Outcome: Progressing Goal: Will remain free from infection Outcome: Progressing Goal: Respiratory complications will improve Outcome: Progressing   Problem: Safety: Goal: Ability to remain free from injury will improve Outcome: Progressing

## 2017-10-06 NOTE — Progress Notes (Signed)
OT Cancellation Note  Patient Details Name: Jocelyn Wells MRN: [id number] DOB: 1955/11/06   Cancelled Treatment:    Reason Eval/Treat Not Completed: Patient at procedure or test/ unavailable. Order received, chart reviewed. Pt out of room for testing. Will re-attempt OT evaluation at later date/time as pt is available and medically appropriate.  Jeni Salles, MPH, MS, OTR/L ascom [phone] 10/06/17, 10:55 AM

## 2017-10-06 NOTE — Progress Notes (Signed)
Discharge instructions given and went over with patient at bedside. All questions answered. Patient discharged home with family via wheelchair by volunteer services. Madlyn Frankel, RN

## 2017-10-06 NOTE — Progress Notes (Signed)
*  PRELIMINARY RESULTS* Echocardiogram 2D Echocardiogram has been performed.  Jocelyn Wells 10/06/2017, 1:43 PM

## 2017-10-06 NOTE — Evaluation (Signed)
Physical Therapy Evaluation Patient Details Name: Jocelyn Wells MRN: [id number] DOB: 10-08-1955 Today's Date: 10/06/2017   History of Present Illness  Jocelyn Wells  is a 62 y.o. female with a known history of diabetes, COPD, cryptogenic stroke and anxiety and depression who presents to the emergency room due to slurred speech and dizziness. Patient reports that approximately 3 weeks ago while she was increased she developed sudden onset of slurred speech and right-sided weakness. Has had slow cognition and continued slurred speech during hospital stay. CT scan negative for any acute infarcts  Clinical Impression  Pt responded very well to therapy. Pt demonstrated some slurred speech and difficulty with recall of recent life events. Pt family says slurred speech and memory issues have been going on for last few weeks but have intensified over last 3 days. Pt eyes tracked normally, and pt reported no visual disturbances. Pt did have some R sided weakness, UE> LE, and decreased coordination/proprioception in RUE. No sensation loss noted.  At baseline pt has been using cane outside of home. Pt reports 5+falls in last 6 mo at home, all while not using cane. Pt husband reports falls happen at night typically. Pt balance was severely limited when therapist tested rhomberg eyes closed, indicating high reliance on visual system for balance. Pt dynamic balance is good when using SPC but very limited w/o AD. Pt HR and SpO2 remained WNL during all activities and pt had not complaints of dizziness or lightheadedness. Pt had no facial droop or cognition changes during session. Pt ould benefit from skilled PT to address above deficits and promote optimal return to PLOF. Recommend transition to outpatient PT upon discharge from acute hospitalization, for continued improvements with pt balance, gait, and R sided weakness.     Follow Up Recommendations Outpatient PT    Equipment Recommendations       Recommendations  for Other Services OT consult     Precautions / Restrictions Precautions Precautions: Fall Precaution Comments: Loop recorder      Mobility  Bed Mobility Overal bed mobility: Modified Independent Bed Mobility: Supine to Sit     Supine to sit: Modified independent (Device/Increase time)     General bed mobility comments: Pt moved supine to sit with increased effort but in normal time and with no assist from therapist.   Transfers Overall transfer level: Needs assistance Equipment used: Straight cane Transfers: Sit to/from Stand Sit to Stand: Min guard;Min assist         General transfer comment: Pt reports sit to stands are hard for her at home. On first attempt therapist gave min assist, but pt reported she did not need help, and activity was repeated and coompleted CGA.    Ambulation/Gait   Gait Distance (Feet): 200 Feet Assistive device: Straight cane Gait Pattern/deviations: Step-through pattern;Decreased step length - left;Decreased stance time - right;Decreased weight shift to right Gait velocity: 1.66 ft/sec Gait velocity interpretation: <1.8 ft/sec, indicate of risk for recurrent falls General Gait Details: Pt has decreased shift to RLE, and less push off w/ RLE. Pt reports she feels weak in her RLE, and has difficulty with shifting to her LLE because of previous patella fx/TKA  Stairs            Wheelchair Mobility    Modified Rankin (Stroke Patients Only)       Balance Overall balance assessment: Needs assistance Sitting-balance support: Feet supported;No upper extremity supported Sitting balance-Leahy Scale: Good     Standing balance support: No upper extremity  supported Standing balance-Leahy Scale: Fair Standing balance comment: Pt has LOB with min to mod perturbations at hips/shoulders in static standing.  Single Leg Stance - Right Leg: 0 Single Leg Stance - Left Leg: 2     Rhomberg - Eyes Opened: 30 Rhomberg - Eyes Closed: 12 High  level balance activites: Turns;Sudden stops;Head turns;Direction changes;Other (comment)(pick up item from floor.) High Level Balance Comments: Pt did not lose balance with Direction changes, turns, sudden stops, head turns, or pickig up an item off of the floor as long as she had SPC. Pt balance without SPC was poor. Side to side weight shifting with no SPC led to multiple LOB requiring therapist assistance.              Pertinent Vitals/Pain Pain Assessment: No/denies pain    Home Living Family/patient expects to be discharged to:: Private residence Living Arrangements: Spouse/significant other;Children   Type of Home: House Home Access: Stairs to enter Entrance Stairs-Rails: Can reach both Entrance Stairs-Number of Steps: 4 Home Layout: One level Home Equipment: Cane - single point;Walker - 2 wheels      Prior Function Level of Independence: Needs assistance   Gait / Transfers Assistance Needed: Ind w/ AD- SPC in home, power scooter in community.   ADL's / Homemaking Assistance Needed: Does some cooking. Husband does all other house activities. Able to bathe and dress ind.  Reports 5 or more falls in last 6 mo.        Hand Dominance   Dominant Hand: Right    Extremity/Trunk Assessment   Upper Extremity Assessment Upper Extremity Assessment: Generalized weakness;RUE deficits/detail RUE Deficits / Details: Pt is slightly weaker tin RUE than LUE. 4-/5 MMT in allmajor muscle groups. Has decreased cooridnation with fine motor tests, as well as with finger to nose test--requires increased time and focus, able to complete activity accurately however  RUE Sensation: decreased proprioception RUE Coordination: decreased fine motor;decreased gross motor    Lower Extremity Assessment Lower Extremity Assessment: Generalized weakness;RLE deficits/detail RLE Deficits / Details: Per chart review and pt rperots R side is weaker than L. Hoewever if this is the case very minimally.  BLE are 4-/5 MMT in all major muscle groups.        Communication   Communication: Expressive difficulties(Slurred speech--family reprots worse in AM)  Cognition Arousal/Alertness: Awake/alert Behavior During Therapy: WFL for tasks assessed/performed Overall Cognitive Status: Impaired/Different from baseline Area of Impairment: Memory                     Memory: (Pt and pt fmaily reprots he is having memery issues. )         General Comments: Pt unable to properly recall locations she went to during cruise 3 weeks ago. Pt reports to PT she is aware of her memory difficulties and knows when she is remembering things wrong.       General Comments      Exercises Other Exercises Other Exercises: Bed mobility: Supine to sit mod ind; Transfers: Sit to stand CGA w/SPC; Ambulation: 200 ft w/ SPC; High level balance activities.    Assessment/Plan    PT Assessment Patient needs continued PT services  PT Problem List Decreased strength;Decreased mobility;Decreased safety awareness;Decreased coordination;Decreased balance       PT Treatment Interventions DME instruction;Therapeutic activities;Gait training;Therapeutic exercise;Stair training;Balance training;Functional mobility training;Patient/family education;Neuromuscular re-education    PT Goals (Current goals can be found in the Care Plan section)  Acute Rehab PT Goals Patient Stated  Goal: To home and get stronger PT Goal Formulation: With patient/family Time For Goal Achievement: 10/13/17 Potential to Achieve Goals: Good    Frequency Min 2X/week   Barriers to discharge        Co-evaluation               AM-PAC PT "6 Clicks" Daily Activity  Outcome Measure Difficulty turning over in bed (including adjusting bedclothes, sheets and blankets)?: None Difficulty moving from lying on back to sitting on the side of the bed? : A Little Difficulty sitting down on and standing up from a chair with arms (e.g.,  wheelchair, bedside commode, etc,.)?: A Little Help needed moving to and from a bed to chair (including a wheelchair)?: A Little Help needed walking in hospital room?: A Little Help needed climbing 3-5 steps with a railing? : A Lot 6 Click Score: 18    End of Session Equipment Utilized During Treatment: Gait belt Activity Tolerance: Patient tolerated treatment well Patient left: in chair;with chair alarm set;with SCD's reapplied;with call bell/phone within reach;with family/visitor present Nurse Communication: Mobility status PT Visit Diagnosis: History of falling (Z91.81);Muscle weakness (generalized) (M62.81);Hemiplegia and hemiparesis Hemiplegia - Right/Left: Right Hemiplegia - dominant/non-dominant: Dominant Hemiplegia - caused by: Cerebral infarction    Time: 0930-1010 PT Time Calculation (min) (ACUTE ONLY): 40 min   Charges:              Hortencia Conradi, SPT 10/06/17,12:28 PM

## 2017-10-06 NOTE — Care Management Note (Addendum)
Case Management Note  Patient Details  Name: Jocelyn Wells MRN: [id number] Date of Birth: 03-05-1955  Subjective/Objective:  Admitted with a CVA.   PT recommending outpatient PT/OT. Spoke with patient and she is agreeable. Offered locations and she chose [facility] outpatient PT/OT. [place] faxed to [facility] OP PT/OT dept at [phone]. Patient lives at home with her spouse who will drive her. She uses no DME but has a walker if needed.                 Action/Plan: OP PT/OT  Expected Discharge Date:  10/06/17               Expected Discharge Plan:  OP Rehab  In-House Referral:     Discharge planning Services  CM Consult  Post Acute Care Choice:    Choice offered to:  Patient  DME Arranged:    DME Agency:     HH Arranged:    [place] Agency:     Status of Service:  Completed, signed off  If discussed at [employer] of Stay Meetings, dates discussed:    Additional Comments:  Jolly Mango, RN 10/06/2017, 1:51 PM

## 2017-10-06 NOTE — Evaluation (Signed)
Occupational Therapy Evaluation Patient Details Name: Jocelyn Wells MRN: [id number] DOB: 12/15/1955 Today's Date: 10/06/2017    History of Present Illness Jocelyn Wells  is a 62 y.o. female with a known history of diabetes, COPD, cryptogenic stroke and anxiety and depression who presents to the emergency room due to slurred speech and dizziness. Patient reports that approximately 3 weeks ago while she was increased she developed sudden onset of slurred speech and right-sided weakness. Has had slow cognition and continued slurred speech during hospital stay. CT scan negative for any acute infarcts   Clinical Impression   Pt seen for OT evaluation this date. Prior to hospital admission, pt was using a SPC outside and "furniture walking" inside the home, with a history of >5 falls in past 6 months, most occurring overnight. Pt lives in the home with her spouse (separate bedrooms 2/2 pt's very different sleep schedule and routines). Pt reports staying awake until about 6am daily and sleeping during the day. Pt also endorses "night blindness" which is why she no longer drives. Currently pt demonstrates impairments in balance, weakness R>L, mild impaired R side coordination requiring PRN assist for LB ADL and CGA for mobility with SPC. Strongly encouraged pt to use SPC in the home and not rely on furniture or walls for support (to help reinforce recommendations made by PT). Pt/family educated in energy conservation strategies, falls prevention strategies including lighting and home/routines modifications.  Pt would benefit from skilled OT to address noted impairments and functional limitations (see below for any additional details) in order to maximize safety and independence while minimizing falls risk and caregiver burden.  Upon hospital discharge, recommend pt discharge home with OP OT to address vision/lighting, home safety, and energy conservation strategies. RNCM notified of OT's recommendations.      Follow Up Recommendations  Outpatient OT    Equipment Recommendations  Other (comment)(reacher)    Recommendations for Other Services       Precautions / Restrictions Precautions Precautions: Fall Precaution Comments: Loop recorder Restrictions Weight Bearing Restrictions: No      Mobility Bed Mobility Overal bed mobility: Modified Independent Bed Mobility: Supine to Sit     Supine to sit: Modified independent (Device/Increase time)     General bed mobility comments: Pt moved supine to sit with increased effort but in normal time and with no assist from therapist.   Transfers Overall transfer level: Needs assistance Equipment used: Straight cane Transfers: Sit to/from Stand Sit to Stand: Min guard;Min assist         General transfer comment: Pt reports sit to stands are hard for her at home. On first attempt therapist gave min assist, but pt reported she did not need help, and activity was repeated and coompleted CGA.      Balance Overall balance assessment: Needs assistance Sitting-balance support: Feet supported;No upper extremity supported Sitting balance-Leahy Scale: Good     Standing balance support: No upper extremity supported Standing balance-Leahy Scale: Fair Standing balance comment: Pt has LOB with min to mod perturbations at hips/shoulders in static standing.            ADL either performed or assessed with clinical judgement   ADL Overall ADL's : Needs assistance/impaired                                       General ADL Comments: Generally supervision level to PRN min assist level  for LB ADL tasks and CGA for mobility with SPC.     Vision Baseline Vision/History: Wears glasses Wears Glasses: Distance only Patient Visual Report: No change from baseline Vision Assessment?: No apparent visual deficits     Perception     Praxis      Pertinent Vitals/Pain Pain Assessment: No/denies pain     Hand Dominance Right    Extremity/Trunk Assessment Upper Extremity Assessment Upper Extremity Assessment: Generalized weakness;RUE deficits/detail RUE Deficits / Details: Pt is slightly weaker tin RUE than LUE. 4-/5 MMT in all major muscle groups. Has decreased coordination with fine motor tests, as well as with finger to nose test and thumb opposition testing --requires increased time and focus, able to complete activity accurately however  RUE Sensation: decreased proprioception RUE Coordination: decreased fine motor;decreased gross motor   Lower Extremity Assessment Lower Extremity Assessment: Defer to PT evaluation;Generalized weakness;RLE deficits/detail RLE Deficits / Details: Per chart review and pt reports R side is weaker than L. However if this is the case very minimally. BLE are 4-/5 MMT in all major muscle groups.        Communication Communication Communication: Expressive difficulties(mild slurred speech--family reports worse in AM)   Cognition Arousal/Alertness: Awake/alert Behavior During Therapy: WFL for tasks assessed/performed Overall Cognitive Status: Impaired/Different from baseline Area of Impairment: Memory                     Memory: (mild memory issues per PT evaluation)       Problem Solving: Slow processing General Comments: alert and oriented x4, but endorses memory deficits recently   General Comments       Exercises Other Exercises Other Exercises: Pt/family educated in energy conservation strategies including pursed lip breathing and activity pacing and home/routines modifications Other Exercises: Pt/family educated in environmental modifications and lighting to minimize falls risk, given pt's history of falls overnight  Other Exercises: Pt/family educated in AE to minimize need to bend over and pick up items off the floor which has led pt to fall before   Shoulder Instructions      Home Living Family/patient expects to be discharged to:: Private  residence Living Arrangements: Spouse/significant other;Children Available Help at Discharge: Family;Available 24 hours/day Type of Home: House Home Access: Stairs to enter [employer] of Steps: 4 Entrance Stairs-Rails: Can reach both Home Layout: One level     Bathroom Shower/Tub: Occupational psychologist: Handicapped height     Home Equipment: [place] - single point;Walker - 2 wheels          Prior Functioning/Environment Level of Independence: Needs assistance  Gait / Transfers Assistance Needed: Ind w/ AD- SPC in home, power scooter in community.  ADL's / Homemaking Assistance Needed: Does some cooking. Husband does all other house activities. Able to bathe and dress ind. Endorses 5+ falls in past 6 months, primarily at night.             OT Problem List: Decreased strength;Decreased knowledge of use of DME or AE;Decreased coordination;Impaired balance (sitting and/or standing)      OT Treatment/Interventions: Self-care/ADL training;Balance training;Therapeutic exercise;Therapeutic activities;Energy conservation;DME and/or AE instruction;Patient/family education    OT Goals(Current goals can be found in the care plan section) Acute Rehab OT Goals Patient Stated Goal: To home and get stronger OT Goal Formulation: With patient/family Time For Goal Achievement: 10/20/17 Potential to Achieve Goals: Good  OT Frequency: Min 1X/week   Barriers to D/C:  Co-evaluation              AM-PAC PT "6 Clicks" Daily Activity     Outcome Measure Help from another person eating meals?: None Help from another person taking care of personal grooming?: None Help from another person toileting, which includes using toliet, bedpan, or urinal?: A Little Help from another person bathing (including washing, rinsing, drying)?: A Little Help from another person to put on and taking off regular upper body clothing?: None Help from another person to put on  and taking off regular lower body clothing?: A Little 6 Click Score: 21   End of Session    Activity Tolerance: Patient tolerated treatment well Patient left: in bed;with call bell/phone within reach;with bed alarm set;with family/visitor present  OT Visit Diagnosis: Unsteadiness on feet (R26.81);Other symptoms and signs involving cognitive function;Repeated falls (R29.6);Muscle weakness (generalized) (M62.81)                Time: 2330-0762 OT Time Calculation (min): 27 min Charges:  OT General Charges $OT Visit: 1 Visit OT Evaluation $OT Eval Low Complexity: 1 Low OT Treatments $Self Care/Home Management : 8-22 mins  Jeni Salles, MPH, MS, OTR/L ascom [phone] 10/06/17, 2:36 PM

## 2017-10-06 NOTE — Discharge Summary (Signed)
[facility] NAME: Tecia Cinnamon    MR#:  [id number]  DATE OF BIRTH:  1955-12-17  DATE OF ADMISSION:  10/05/2017   ADMITTING PHYSICIAN: Bettey Costa, MD  DATE OF DISCHARGE:  10/06/17  PRIMARY CARE PHYSICIAN: Chrismon, Vickki Muff, PA   ADMISSION DIAGNOSIS:   Dizziness [R42] Slurred speech [R47.81] Memory impairment [R41.3] Hyperglycemia [R73.9]  DISCHARGE DIAGNOSIS:   Active Problems:   CVA (cerebral vascular accident) ([place])   SECONDARY DIAGNOSIS:   Past Medical History:  Diagnosis Date  . Anxiety   . Cardiomyopathy [facility])    new to her Jan 2017  . COPD (chronic obstructive pulmonary disease) ([place])   . Depression   . Diabetes mellitus, type II ([place])   . HTN (hypertension)   . Obesity   . PONV (postoperative nausea and vomiting)   . Stroke [facility])    Jan 2017    HOSPITAL COURSE:   62 year old female with past medical history significant for nonischemic cardiomyopathy, uncontrolled diabetes, hypertension, obesity, COPD, history of cryptogenic strokes resulting in cognitive difficulties presents to hospital secondary to worsening speech and dizziness and generalized weakness.  1.  Possible TIA-MRI of the brain negative for any new infarcts.  MRA with no significant occlusions. -Carotid Dopplers with no hemodynamically significant stenosis. -Echocardiogram done but pending -Patient has been noncompliant with her aspirin at home.  Appreciate neurology consult. -Change to Plavix.  Increase the dose of statin at discharge -Physical therapy recommended outpatient PT.  2.  Diabetes mellitus-continue exenatide, glyburide/metformin and Novolin 70/30  3.  Ischemic cardiomyopathy-follow-up with cardiology as outpatient.  Patient has a loop recorder.  Continue Coreg, losartan and Aldactone  4.  Depression-on Paxil  Patient work with PT and they have recommended outpatient services.  Outpatient follow-up with neurology  recommended  DISCHARGE CONDITIONS:   Guarded  CONSULTS OBTAINED:   Treatment Team:  Alexis Goodell, MD  DRUG ALLERGIES:   Allergies  Allergen Reactions  . Codeine Nausea And Vomiting   DISCHARGE MEDICATIONS:   Allergies as of 10/06/2017      Reactions   Codeine Nausea And Vomiting      Medication List    STOP taking these medications   aspirin 325 MG tablet     TAKE these medications   atorvastatin 40 MG tablet Commonly known as:  LIPITOR Take 1 tablet (40 mg total) by mouth daily. What changed:    medication strength  how much to take   B-D INS SYRINGE 0.5CC/31GX5/16 31G X 5/16" 0.5 ML Misc Generic drug:  Insulin Syringe-Needle U-100   BYDUREON 2 MG Pen Generic drug:  Exenatide ER Inject 2 mg into the skin once a week. SATURDAY   carvedilol 12.5 MG tablet Commonly known as:  COREG TAKE 1 TABLET (12.5 MG TOTAL) BY MOUTH 2 (TWO) TIMES DAILY.   clopidogrel 75 MG tablet Commonly known as:  PLAVIX Take 1 tablet (75 mg total) by mouth daily. Start taking on:  10/07/2017   diclofenac 75 MG EC tablet Commonly known as:  VOLTAREN TAKE 1 TABLET BY MOUTH TWICE A DAY AS NEEDED   gabapentin 300 MG capsule Commonly known as:  NEURONTIN Take 300 mg by mouth daily.   glyBURIDE-metformin 5-500 MG tablet Commonly known as:  GLUCOVANCE Take 2 tablets by mouth 2 (two) times daily.   loratadine 10 MG tablet Commonly known as:  CLARITIN Take 1 tablet by mouth daily.   losartan 50 MG tablet Commonly known as:  COZAAR TAKE 1 TABLET BY MOUTH EVERY DAY   NOVOLIN 70/30 RELION (70-30) 100 UNIT/ML injection Generic drug:  insulin NPH-regular Human Inject 40-70 Units into the skin 2 (two) times daily with a meal. 40units-AM,  70UNITS-PM   PARoxetine 40 MG tablet Commonly known as:  PAXIL Take 1 tablet (40 mg total) by mouth daily.   spironolactone 25 MG tablet Commonly known as:  ALDACTONE Take 12.5 mg by mouth daily.        DISCHARGE INSTRUCTIONS:    1. PCP f/u in 1-2 weeks 2. Neurology f/u in 2 weeks  DIET:   Cardiac diet  ACTIVITY:   Activity as tolerated  OXYGEN:   Home Oxygen: No.  Oxygen Delivery: room air  DISCHARGE LOCATION:   home   If you experience worsening of your admission symptoms, develop shortness of breath, life threatening emergency, suicidal or homicidal thoughts you must seek medical attention immediately by calling 911 or calling your MD immediately  if symptoms less severe.  You Must read complete instructions/literature along with all the possible adverse reactions/side effects for all the Medicines you take and that have been prescribed to you. Take any new Medicines after you have completely understood and accpet all the possible adverse reactions/side effects.   Please note  You were cared for by a hospitalist during your hospital stay. If you have any questions about your discharge medications or the care you received while you were in the hospital after you are discharged, you can call the unit and asked to speak with the hospitalist on call if the hospitalist that took care of you is not available. Once you are discharged, your primary care physician will handle any further medical issues. Please note that NO REFILLS for any discharge medications will be authorized once you are discharged, as it is imperative that you return to your primary care physician (or establish a relationship with a primary care physician if you do not have one) for your aftercare needs so that they can reassess your need for medications and monitor your lab values.    On the day of Discharge:  VITAL SIGNS:   Blood pressure 118/60, pulse 66, temperature 98.6 F (37 C), temperature source Oral, resp. rate 16, height 5\' 6"  (1.676 m), weight 106.1 kg, SpO2 93 %.  PHYSICAL EXAMINATION:    GENERAL:  62 y.o.-year-old obese patient lying in the bed with no acute distress.  EYES: Pupils equal, round, reactive to light and  accommodation. No scleral icterus. Extraocular muscles intact.  HEENT: Head atraumatic, normocephalic. Oropharynx and nasopharynx clear.  NECK:  Supple, no jugular venous distention. No thyroid enlargement, no tenderness.  LUNGS: Normal breath sounds bilaterally, no wheezing, rales,rhonchi or crepitation. No use of accessory muscles of respiration. Decreased bibasilar breath sounds CARDIOVASCULAR: S1, S2 normal. No  rubs, or gallops. 2/6 systolic murmur present ABDOMEN: Soft, non-tender, non-distended. Bowel sounds present. No organomegaly or mass.  EXTREMITIES: No pedal edema, cyanosis, or clubbing.  NEUROLOGIC: Cranial nerves II through XII are intact. Muscle strength 5/5 in all extremities. Sensation slightly decreased to light touch in both legs. Gait not checked. Global weakness noted. PSYCHIATRIC: The patient is alert and oriented x 3.  SKIN: No obvious rash, lesion, or ulcer.   DATA REVIEW:   CBC Recent Labs  Lab 10/05/17 1738  WBC 9.5  HGB 11.2*  HCT 34.4*  PLT 181    Chemistries  Recent Labs  Lab 10/05/17 1738  NA 136  K 4.8  CL 100  CO2 27  GLUCOSE 317*  BUN 22  CREATININE 1.70*  CALCIUM 8.4*  AST 18  ALT 15  ALKPHOS 133*  BILITOT 0.6     Microbiology Results  No results found for this or any previous visit.  RADIOLOGY:  Mr Brain 23 Contrast  Result Date: 10/06/2017 CLINICAL DATA:  62 y/o F; slurred speech, dizziness, right-sided weakness. EXAM: MRI HEAD WITHOUT CONTRAST MRA HEAD WITHOUT CONTRAST TECHNIQUE: Multiplanar, multiecho pulse sequences of the brain and surrounding structures were obtained without intravenous contrast. Angiographic images of the head were obtained using MRA technique without contrast. COMPARISON:  01/28/2018 CTA, CT, and MRI of the head. 10/05/2017 CT head. FINDINGS: MRI HEAD FINDINGS Brain: No acute infarction, hemorrhage, hydrocephalus, extra-axial collection or mass lesion. Several nonspecific T2 FLAIR hyperintensities in  subcortical and periventricular white matter are compatible with mild chronic microvascular ischemic progressed from 2016. Mild volume loss of the brain. Vascular: As below. Skull and upper cervical spine: Normal marrow signal. Sinuses/Orbits: Moderate left maxillary and anterior ethmoid air cell mucosal thickening. No significant abnormal signal of the mastoid air cells. Orbits are unremarkable. Other: None. MRA HEAD FINDINGS Internal carotid arteries:  Patent. Anterior cerebral arteries:  Patent. Middle cerebral arteries: Patent. Anterior communicating artery: Patent. Posterior communicating arteries:  Patent. Posterior cerebral arteries:  Patent. Basilar artery:  Patent. Vertebral arteries:  Patent. No evidence of high-grade stenosis, large vessel occlusion, or aneurysm. IMPRESSION: 1. No acute intracranial abnormality. 2. Patent anterior and posterior intracranial circulation. No large vessel occlusion, aneurysm, or significant stenosis is identified. 3. Mild chronic microvascular ischemic changes and volume loss of the brain is progressed from 2016. 4. Moderate left maxillary and left anterior ethmoid mucosal thickening. Electronically Signed   By: Kristine Garbe M.D.   On: 10/06/2017 06:07   US Carotid Bilateral (at [facility])  Result Date: 10/06/2017 CLINICAL DATA:  Hypertension, stroke, hyperlipidemia and diabetes EXAM: BILATERAL CAROTID DUPLEX ULTRASOUND TECHNIQUE: Pearline Cables scale imaging, color Doppler and duplex ultrasound were performed of bilateral carotid and vertebral arteries in the neck. COMPARISON:  10/06/2017 FINDINGS: Criteria: Quantification of carotid stenosis is based on velocity parameters that correlate the residual internal carotid diameter with NASCET-based stenosis levels, using the diameter of the distal internal carotid lumen as the denominator for stenosis measurement. The following velocity measurements were obtained: RIGHT ICA: 56/22 cm/sec CCA: 01/02 cm/sec SYSTOLIC  ICA/CCA RATIO:  0.6 ECA: 127 cm/sec LEFT ICA: 70/22 cm/sec CCA: 72/53 cm/sec SYSTOLIC ICA/CCA RATIO:  1.0 ECA: 167 cm/sec RIGHT CAROTID ARTERY: Minor echogenic shadowing plaque formation. No hemodynamically significant right ICA stenosis, velocity elevation, or turbulent flow. Degree of narrowing less than 50%. RIGHT VERTEBRAL ARTERY:  Antegrade LEFT CAROTID ARTERY: Similar scattered minor echogenic plaque formation. No hemodynamically significant left ICA stenosis, velocity elevation, or turbulent flow. LEFT VERTEBRAL ARTERY:  Antegrade IMPRESSION: Minor carotid atherosclerosis. No hemodynamically significant ICA stenosis. Degree of narrowing less than 50% bilaterally by ultrasound criteria. Patent antegrade vertebral flow bilaterally Electronically Signed   By: Jerilynn Mages.  Shick M.D.   On: 10/06/2017 11:06   Mr Jodene Nam Head/brain GU Cm  Result Date: 10/06/2017 CLINICAL DATA:  62 y/o F; slurred speech, dizziness, right-sided weakness. EXAM: MRI HEAD WITHOUT CONTRAST MRA HEAD WITHOUT CONTRAST TECHNIQUE: Multiplanar, multiecho pulse sequences of the brain and surrounding structures were obtained without intravenous contrast. Angiographic images of the head were obtained using MRA technique without contrast. COMPARISON:  01/28/2018 CTA, CT, and MRI of the head. 10/05/2017 CT head. FINDINGS: MRI HEAD FINDINGS Brain: No  acute infarction, hemorrhage, hydrocephalus, extra-axial collection or mass lesion. Several nonspecific T2 FLAIR hyperintensities in subcortical and periventricular white matter are compatible with mild chronic microvascular ischemic progressed from 2016. Mild volume loss of the brain. Vascular: As below. Skull and upper cervical spine: Normal marrow signal. Sinuses/Orbits: Moderate left maxillary and anterior ethmoid air cell mucosal thickening. No significant abnormal signal of the mastoid air cells. Orbits are unremarkable. Other: None. MRA HEAD FINDINGS Internal carotid arteries:  Patent. Anterior cerebral  arteries:  Patent. Middle cerebral arteries: Patent. Anterior communicating artery: Patent. Posterior communicating arteries:  Patent. Posterior cerebral arteries:  Patent. Basilar artery:  Patent. Vertebral arteries:  Patent. No evidence of high-grade stenosis, large vessel occlusion, or aneurysm. IMPRESSION: 1. No acute intracranial abnormality. 2. Patent anterior and posterior intracranial circulation. No large vessel occlusion, aneurysm, or significant stenosis is identified. 3. Mild chronic microvascular ischemic changes and volume loss of the brain is progressed from 2016. 4. Moderate left maxillary and left anterior ethmoid mucosal thickening. Electronically Signed   By: Kristine Garbe M.D.   On: 10/06/2017 06:07     Management plans discussed with the patient, family and they are in agreement.  CODE STATUS:     Code Status Orders  (From admission, onward)         Start     Ordered   10/05/17 2001  Full code  Continuous     10/05/17 2001        Code Status History    Date Active Date Inactive Code Status Order ID Comments User Context   01/29/2015 2011 01/31/2015 2117 Full Code [id number]  Elmarie Shiley, MD Inpatient      TOTAL TIME TAKING CARE OF THIS PATIENT: 38 minutes.    Tremon Sainvil M.D on 10/06/2017 at 3:00 PM  Between 7am to 6pm - Pager - [phone]  After 6pm go to [email or web address] - Proofreader  [facility]  Office  [phone]  CC: Primary care physician; Margo Common, PA   Note: This dictation was prepared with Dragon dictation along with smaller phrase technology. Any transcriptional errors that result from this process are unintentional.

## 2017-10-07 LAB — HIV ANTIBODY (ROUTINE TESTING W REFLEX): HIV SCREEN 4TH GENERATION: NONREACTIVE

## 2017-10-11 ENCOUNTER — Ambulatory Visit (INDEPENDENT_AMBULATORY_CARE_PROVIDER_SITE_OTHER): Payer: Medicare Other | Admitting: Family Medicine

## 2017-10-11 ENCOUNTER — Encounter: Payer: Self-pay | Admitting: Family Medicine

## 2017-10-11 VITALS — BP 126/64 | HR 88 | Temp 97.6°F | Wt 225.0 lb

## 2017-10-11 DIAGNOSIS — I63429 Cerebral infarction due to embolism of unspecified anterior cerebral artery: Secondary | ICD-10-CM | POA: Diagnosis not present

## 2017-10-11 DIAGNOSIS — E1143 Type 2 diabetes mellitus with diabetic autonomic (poly)neuropathy: Secondary | ICD-10-CM | POA: Diagnosis not present

## 2017-10-11 DIAGNOSIS — Z794 Long term (current) use of insulin: Secondary | ICD-10-CM

## 2017-10-11 DIAGNOSIS — I69393 Ataxia following cerebral infarction: Secondary | ICD-10-CM | POA: Diagnosis not present

## 2017-10-11 DIAGNOSIS — Z8739 Personal history of other diseases of the musculoskeletal system and connective tissue: Secondary | ICD-10-CM

## 2017-10-11 NOTE — Progress Notes (Signed)
Patient: Jocelyn Wells Female    DOB: 1955/07/10   62 y.o.   MRN: [id number] Visit Date: 10/11/2017  Today's Provider: Vernie Murders, PA   Chief Complaint  Patient presents with  . Hospitalization Follow-up   Subjective:    HPI  This 62 year old female with a history of uncontrolled diabetes, cryptogenic stroke in 2016 resulting in cognitive difficulties went to the ER on 10-06-17 with symptoms of generalized weakness, dizziness and worsening speech (in the mornings). MRI of the brain was negative for any new infarcts, MRA negative for occlusions, carotid dopplers negative for stenosis and blood glucose 317. She is scheduled to see her endocrinologist (Dr. Gabriel Carina) on 10-12-17, psychiatrist (Dr. Gretel Acre) on 10-17-17 and neurologist Chipper Herb, NP) on 10-24-17.  Was treated for gout with  prednisone 09-12-17 and another round through 10-03-17. Diabetes was out of control during this time and symptoms of dizziness with slurring of speech worsened.    Past Medical History:  Diagnosis Date  . Anxiety   . Cardiomyopathy [facility])    new to her Jan 2017  . COPD (chronic obstructive pulmonary disease) ([place])   . Depression   . Diabetes mellitus, type II ([place])   . HTN (hypertension)   . Obesity   . PONV (postoperative nausea and vomiting)   . Stroke [facility])    Jan 2017   Past Surgical History:  Procedure Laterality Date  . ABDOMINAL HYSTERECTOMY    . ANKLE FRACTURE SURGERY Left 2002  . BILATERAL SALPINGOOPHORECTOMY  2000  . CARDIAC CATHETERIZATION N/A 03/25/2015   Procedure: Right/Left Heart Cath and Coronary Angiography;  Surgeon: Belva Crome, MD;  Location: [place] CV LAB;  Service: Cardiovascular;  Laterality: N/A;  . [place]  . COLONOSCOPY WITH PROPOFOL N/A 09/22/2016   Procedure: COLONOSCOPY WITH PROPOFOL;  Surgeon: Manya Silvas, MD;  Location: [facility] ENDOSCOPY;  Service: Endoscopy;  Laterality: N/A;  . EP IMPLANTABLE DEVICE N/A 08/05/2015   Procedure: Loop  Recorder Insertion;  Surgeon: Deboraha Sprang, MD;  Location: [place] CV LAB;  Service: Cardiovascular;  Laterality: N/A;  . ESOPHAGOGASTRODUODENOSCOPY (EGD) WITH PROPOFOL N/A 09/22/2016   Procedure: ESOPHAGOGASTRODUODENOSCOPY (EGD) WITH PROPOFOL;  Surgeon: Manya Silvas, MD;  Location: [facility] ENDOSCOPY;  Service: Endoscopy;  Laterality: N/A;  . ESOPHAGOGASTRODUODENOSCOPY (EGD) WITH PROPOFOL N/A 12/08/2016   Procedure: ESOPHAGOGASTRODUODENOSCOPY (EGD) WITH PROPOFOL;  Surgeon: Manya Silvas, MD;  Location: [facility] ENDOSCOPY;  Service: Endoscopy;  Laterality: N/A;  . KNEE ARTHROSCOPY Left 2005  . TEE WITHOUT CARDIOVERSION N/A 01/31/2015   Procedure: TRANSESOPHAGEAL ECHOCARDIOGRAM (TEE);  Surgeon: Lelon Perla, MD;  Location: [place];  Service: Cardiovascular;  Laterality: N/A;  . [place]  . ULNAR NERVE TRANSPOSITION  2008   Family History  Problem Relation Age of Onset  . Heart disease Mother        died from CHF  . Asthma Mother   . Diabetes Mother   . Heart disease Father   . Aneurysm Father   . COPD Brother   . Diabetes Brother   . Alcohol abuse Paternal Aunt   . Anemia Neg Hx   . Arrhythmia Neg Hx   . Clotting disorder Neg Hx   . Fainting Neg Hx   . Heart attack Neg Hx   . Heart failure Neg Hx   . Hyperlipidemia Neg Hx   . Hypertension Neg Hx    Allergies  Allergen Reactions  . Codeine Nausea  And Vomiting    Current Outpatient Medications:  .  atorvastatin (LIPITOR) 40 MG tablet, Take 1 tablet (40 mg total) by mouth daily., Disp: 30 tablet, Rfl: 2 .  B-D INS SYRINGE 0.5CC/31GX5/16 31G X 5/16" 0.5 ML MISC, , Disp: , Rfl:  .  BYDUREON 2 MG PEN, Inject 2 mg into the skin once a week. SATURDAY, Disp: , Rfl:  .  carvedilol (COREG) 12.5 MG tablet, TAKE 1 TABLET (12.5 MG TOTAL) BY MOUTH 2 (TWO) TIMES DAILY., Disp: 60 tablet, Rfl: 11 .  clopidogrel (PLAVIX) 75 MG tablet, Take 1 tablet (75 mg total) by mouth daily., Disp: 30 tablet, Rfl: 2 .  diclofenac  (VOLTAREN) 75 MG EC tablet, TAKE 1 TABLET BY MOUTH TWICE A DAY AS NEEDED, Disp: 60 tablet, Rfl: 2 .  gabapentin (NEURONTIN) 300 MG capsule, Take 300 mg by mouth daily. , Disp: , Rfl:  .  glyBURIDE-metformin (GLUCOVANCE) 5-500 MG per tablet, Take 2 tablets by mouth 2 (two) times daily. , Disp: , Rfl:  .  loratadine (CLARITIN) 10 MG tablet, Take 1 tablet by mouth daily., Disp: , Rfl:  .  losartan (COZAAR) 50 MG tablet, TAKE 1 TABLET BY MOUTH EVERY DAY, Disp: 90 tablet, Rfl: 2 .  NOVOLIN 70/30 RELION (70-30) 100 UNIT/ML injection, Inject 40-70 Units into the skin 2 (two) times daily with a meal. 40units-AM,  70UNITS-PM, Disp: , Rfl:  .  PARoxetine (PAXIL) 40 MG tablet, Take 1 tablet (40 mg total) by mouth daily., Disp: 90 tablet, Rfl: 1 .  spironolactone (ALDACTONE) 25 MG tablet, Take 12.5 mg by mouth daily. , Disp: , Rfl:   Review of Systems  Constitutional: Negative.   Respiratory: Negative.   Cardiovascular: Negative.   Gastrointestinal: Negative.   Musculoskeletal: Negative.   Neurological: Positive for dizziness. Negative for light-headedness and headaches.    Social History   Tobacco Use  . Smoking status: Current Every Day Smoker    Packs/day: 1.00    Years: 35.00    Pack years: 35.00    Types: Cigarettes    Start date: 02/08/1974  . Smokeless tobacco: Never Used  . Tobacco comment: "I can not quit smoking, I have tried". 1 PPD  Substance Use Topics  . Alcohol use: No    Alcohol/week: 0.0 standard drinks   Objective:   BP 126/64 (BP Location: Right Arm, Patient Position: Sitting, Cuff Size: Normal)   Pulse 88   Temp 97.6 F (36.4 C) (Oral)   Wt 225 lb (102.1 kg)   SpO2 94%   BMI 36.32 kg/m  Vitals:   10/11/17 1450  BP: 126/64  Pulse: 88  Temp: 97.6 F (36.4 C)  TempSrc: Oral  SpO2: 94%  Weight: 225 lb (102.1 kg)   Physical Exam  Constitutional: She appears well-developed and well-nourished. No distress.  HENT:  Head: Normocephalic and atraumatic.  Right Ear:  Hearing normal.  Left Ear: Hearing normal.  Nose: Nose normal.  Eyes: Conjunctivae and lids are normal. Right eye exhibits no discharge. Left eye exhibits no discharge. No scleral icterus.  Neck: Neck supple.  Cardiovascular: Normal rate and regular rhythm.  Pulmonary/Chest: Effort normal and breath sounds normal. No respiratory distress.  Abdominal: Soft. Bowel sounds are normal.  Musculoskeletal: Normal range of motion.  Some stiffness and crepitus both knees limits ability to walk without cane or motorized chair.  Neurological: Coordination abnormal.  Slight ataxia to stand and close eyes today. Peripheral neuropathy bottom both feet and pin prick sensation.  Skin: Skin  is intact. No lesion and no rash noted.  Psychiatric: She has a normal mood and affect. Her speech is normal and behavior is normal. Thought content normal.      Assessment & Plan:     1. Cerebrovascular accident (CVA) due to embolism of anterior cerebral artery, unspecified blood vessel laterality (HCC) Possible residual dizziness, ataxia, slurring of speech from past CVA versus diabetes out of control due to recent prolonged use of steroids for gout attack. No speech or dizziness issues today. Arthritis causes difficulty with ambulation, also. Usually walks with a cane or rides her motorized chair if going any significant distance (I.e. - grocery shopping). Proceed with neurology evaluation/follow up.  2. Ataxia due to old stroke Recent evaluation with MRI and MRA did not show evidence of a new stroke. Has an appointment to follow up with neurologist on 10-24-17 for further evaluation of poor balance and dizziness.  3. Type 2 diabetes mellitus with diabetic autonomic neuropathy, with long-term current use of insulin ([place]) Followed by Dr. Gabriel Carina (endocrinologist). States she has some troubles maintaining regular schedule of taking the Glucovance 5-500 mg 2 tablets BID and Novolin 70/30 40-70 units BID. States son has helped  with preparing medication organizer once a week. Use of the prednisone for gout caused blood sugar control to be very difficult. Will follow up with Dr. Gabriel Carina tomorrow. Uncontrolled diabetes may have been part of the reason for slurred speech, ataxia and dizziness.  4. History of acute gouty arthritis Treated 09-12-17 and 09-23-17 with prednisone tapers. Pain and redness in the right ankle cleared and uric acid level 6.2 on 09-12-17. Encouraged to restrict purines in diet and recheck as needed.       Vernie Murders, PA  [facility]

## 2017-10-12 ENCOUNTER — Ambulatory Visit (INDEPENDENT_AMBULATORY_CARE_PROVIDER_SITE_OTHER): Payer: Medicare Other | Admitting: *Deleted

## 2017-10-12 DIAGNOSIS — F17200 Nicotine dependence, unspecified, uncomplicated: Secondary | ICD-10-CM | POA: Diagnosis not present

## 2017-10-12 DIAGNOSIS — I639 Cerebral infarction, unspecified: Secondary | ICD-10-CM | POA: Diagnosis not present

## 2017-10-12 DIAGNOSIS — E1122 Type 2 diabetes mellitus with diabetic chronic kidney disease: Secondary | ICD-10-CM | POA: Diagnosis not present

## 2017-10-12 DIAGNOSIS — N183 Chronic kidney disease, stage 3 (moderate): Secondary | ICD-10-CM | POA: Diagnosis not present

## 2017-10-12 DIAGNOSIS — Z9289 Personal history of other medical treatment: Secondary | ICD-10-CM | POA: Diagnosis not present

## 2017-10-12 DIAGNOSIS — E113393 Type 2 diabetes mellitus with moderate nonproliferative diabetic retinopathy without macular edema, bilateral: Secondary | ICD-10-CM | POA: Diagnosis not present

## 2017-10-12 DIAGNOSIS — E1142 Type 2 diabetes mellitus with diabetic polyneuropathy: Secondary | ICD-10-CM | POA: Diagnosis not present

## 2017-10-12 DIAGNOSIS — L400 Psoriasis vulgaris: Secondary | ICD-10-CM | POA: Diagnosis not present

## 2017-10-12 DIAGNOSIS — R809 Proteinuria, unspecified: Secondary | ICD-10-CM | POA: Diagnosis not present

## 2017-10-12 DIAGNOSIS — Z794 Long term (current) use of insulin: Secondary | ICD-10-CM | POA: Diagnosis not present

## 2017-10-12 DIAGNOSIS — E1165 Type 2 diabetes mellitus with hyperglycemia: Secondary | ICD-10-CM | POA: Diagnosis not present

## 2017-10-12 DIAGNOSIS — E1129 Type 2 diabetes mellitus with other diabetic kidney complication: Secondary | ICD-10-CM | POA: Diagnosis not present

## 2017-10-13 NOTE — Progress Notes (Signed)
Carelink Summary Report / Loop Recorder 

## 2017-10-17 ENCOUNTER — Ambulatory Visit (INDEPENDENT_AMBULATORY_CARE_PROVIDER_SITE_OTHER): Payer: Medicare Other | Admitting: Psychiatry

## 2017-10-17 ENCOUNTER — Other Ambulatory Visit: Payer: Self-pay

## 2017-10-17 ENCOUNTER — Encounter: Payer: Self-pay | Admitting: Psychiatry

## 2017-10-17 VITALS — BP 103/69 | HR 83 | Temp 97.4°F | Wt 228.4 lb

## 2017-10-17 DIAGNOSIS — F0630 Mood disorder due to known physiological condition, unspecified: Secondary | ICD-10-CM | POA: Diagnosis not present

## 2017-10-17 DIAGNOSIS — L400 Psoriasis vulgaris: Secondary | ICD-10-CM | POA: Diagnosis not present

## 2017-10-17 DIAGNOSIS — I639 Cerebral infarction, unspecified: Secondary | ICD-10-CM | POA: Diagnosis not present

## 2017-10-17 MED ORDER — GABAPENTIN 300 MG PO CAPS: 300.0000 mg | ORAL_CAPSULE | Freq: Every day | ORAL | 11 refills | Status: DC

## 2017-10-17 MED ORDER — PAROXETINE HCL 40 MG PO TABS: 40.0000 mg | ORAL_TABLET | Freq: Every day | ORAL | 1 refills | Status: DC

## 2017-10-17 NOTE — Progress Notes (Signed)
[facility] MD/PA/NP OP Progress Note  10/17/2017 1:58 PM Jocelyn Wells  MRN:  [id number]  Subjective:   Pt is  a 62 -year-old female who presented for follow-up appointment. Patient reported that feeling tired and was diagnosed with residual from the previous stroke.  Patient reported that she is having difficulty speaking.  She was noted to be talking with some scanning speech.  Patient reported that she has been compliant with her medication and her husband and son are helpful.  He is also trying to manage her diabetes as her blood sugar is not under control as she was giving 2 rounds of prednisone related to her gout.  Patient reported that she has been trying to eat healthy now.  Patient stated that she lives with her family and they are planning trips around the holidays.  She was receptive to her medications.  She sleeps well with the help of gabapentin.  She was asking for Klonopin.  We discussed about the medications and the side effects in detail.  She demonstrated understanding.  She denied having any suicidal homicidal ideations or plans at this time.     Chief Complaint:  Chief Complaint    Follow-up; Medication Refill     Visit Diagnosis:     ICD-10-CM   1. MDD (major depressive disorder), recurrent episode, moderate ([place]) F33.1     Past Medical History:  Past Medical History:  Diagnosis Date  . Anxiety   . Cardiomyopathy [facility])    new to her Jan 2017  . COPD (chronic obstructive pulmonary disease) ([place])   . Depression   . Diabetes mellitus, type II ([place])   . HTN (hypertension)   . Obesity   . PONV (postoperative nausea and vomiting)   . Stroke [facility])    Jan 2017    Past Surgical History:  Procedure Laterality Date  . ABDOMINAL HYSTERECTOMY    . ANKLE FRACTURE SURGERY Left 2002  . BILATERAL SALPINGOOPHORECTOMY  2000  . CARDIAC CATHETERIZATION N/A 03/25/2015   Procedure: Right/Left Heart Cath and Coronary Angiography;  Surgeon: Belva Crome, MD;  Location: [place] CV  LAB;  Service: Cardiovascular;  Laterality: N/A;  . [place]  . COLONOSCOPY WITH PROPOFOL N/A 09/22/2016   Procedure: COLONOSCOPY WITH PROPOFOL;  Surgeon: Manya Silvas, MD;  Location: [facility] ENDOSCOPY;  Service: Endoscopy;  Laterality: N/A;  . EP IMPLANTABLE DEVICE N/A 08/05/2015   Procedure: Loop Recorder Insertion;  Surgeon: Deboraha Sprang, MD;  Location: [place] CV LAB;  Service: Cardiovascular;  Laterality: N/A;  . ESOPHAGOGASTRODUODENOSCOPY (EGD) WITH PROPOFOL N/A 09/22/2016   Procedure: ESOPHAGOGASTRODUODENOSCOPY (EGD) WITH PROPOFOL;  Surgeon: Manya Silvas, MD;  Location: [facility] ENDOSCOPY;  Service: Endoscopy;  Laterality: N/A;  . ESOPHAGOGASTRODUODENOSCOPY (EGD) WITH PROPOFOL N/A 12/08/2016   Procedure: ESOPHAGOGASTRODUODENOSCOPY (EGD) WITH PROPOFOL;  Surgeon: Manya Silvas, MD;  Location: [facility] ENDOSCOPY;  Service: Endoscopy;  Laterality: N/A;  . KNEE ARTHROSCOPY Left 2005  . TEE WITHOUT CARDIOVERSION N/A 01/31/2015   Procedure: TRANSESOPHAGEAL ECHOCARDIOGRAM (TEE);  Surgeon: Lelon Perla, MD;  Location: [place];  Service: Cardiovascular;  Laterality: N/A;  . [place]  . ULNAR NERVE TRANSPOSITION  2008   Family History:  Family History  Problem Relation Age of Onset  . Heart disease Mother        died from CHF  . Asthma Mother   . Diabetes Mother   . Heart disease Father   . Aneurysm Father   . COPD Brother   .  Diabetes Brother   . Alcohol abuse Paternal Aunt   . Anemia Neg Hx   . Arrhythmia Neg Hx   . Clotting disorder Neg Hx   . Fainting Neg Hx   . Heart attack Neg Hx   . Heart failure Neg Hx   . Hyperlipidemia Neg Hx   . Hypertension Neg Hx    Social History:  Social History   Socioeconomic History  . Marital status: Married    Spouse name: Not on file  . Number of children: 2  . Years of education: Not on file  . Highest education level: Bachelor's degree (e.g., BA, AB, BS)  Occupational History  . Occupation:  disabled    Comment: retired  Scientific laboratory technician  . Financial resource strain: Not hard at all  . Food insecurity:    Worry: Never true    Inability: Never true  . Transportation needs:    Medical: No    Non-medical: No  Tobacco Use  . Smoking status: Current Every Day Smoker    Packs/day: 1.00    Years: 35.00    Pack years: 35.00    Types: Cigarettes    Start date: 02/08/1974  . Smokeless tobacco: Never Used  . Tobacco comment: "I can not quit smoking, I have tried". 1 PPD  Substance and Sexual Activity  . Alcohol use: No    Alcohol/week: 0.0 standard drinks  . Drug use: No  . Sexual activity: Yes    Birth control/protection: None  Lifestyle  . Physical activity:    Days per week: 0 days    Minutes per session: 0 min  . Stress: Only a little  Relationships  . Social connections:    Talks on phone: More than three times a week    Gets together: Once a week    Attends religious service: Never    Active member of club or organization: Yes    Attends meetings of clubs or organizations: Never    Relationship status: Married  Other Topics Concern  . Not on file  Social History Narrative   Lives at home with stepson and husband, dogs and cats   Caffeine  Drinks sweet tea.   Right handed.    Additional History:  Married x 18 years. Has 2 children and Her husband has 2 children as well Assessment:   Musculoskeletal: Strength & Muscle Tone: within normal limits Gait & Station: normal Patient leans: N/A  Psychiatric Specialty Exam: Medication Refill  Associated symptoms include weakness. Pertinent negatives include no chills, coughing, headaches, nausea, rash or vomiting.    Review of Systems  Constitutional: Negative for chills and weight loss.  HENT: Negative for nosebleeds and tinnitus.   Eyes: Negative for double vision and discharge.  Respiratory: Negative for cough.   Cardiovascular: Negative for orthopnea.  Gastrointestinal: Negative for blood in stool, diarrhea,  nausea and vomiting.  Genitourinary: Negative for urgency.  Skin: Negative for rash.  Neurological: Positive for dizziness, speech change and weakness. Negative for tingling, sensory change and headaches.  Endo/Heme/Allergies: Negative for environmental allergies.  Psychiatric/Behavioral: Negative for depression.  All other systems reviewed and are negative.   Blood pressure 103/69, pulse 83, temperature (!) 97.4 F (36.3 C), temperature source Oral, weight 228 lb 6.4 oz (103.6 kg).Body mass index is 36.86 kg/m.  General Appearance: Casual  Eye Contact:  Fair  Speech:  Slow  Volume:  Normal  Mood:  Anxious  Affect:  Congruent  Thought Process:  Coherent  Orientation:  Full (  Time, Place, and Person)  Thought Content:  WDL  Suicidal Thoughts:  No  Homicidal Thoughts:  No  Memory:  Immediate;   Fair  Judgement:  Fair  Insight:  Fair  Psychomotor Activity:  Normal  Concentration:  Fair  Recall:  [employer] of Knowledge: Fair  Language: Fair  Akathisia:  No  Handed:  Right  AIMS (if indicated):  none  Assets:  Communication Skills Desire for Improvement Physical Health Social Support  ADL's:  Intact  Cognition: WNL  Sleep:  6-7   Is the patient at risk to self?  No. Has the patient been a risk to self in the past 6 months?  No. Has the patient been a risk to self within the distant past?  No. Is the patient a risk to others?  No. Has the patient been a risk to others in the past 6 months?  No. Has the patient been a risk to others within the distant past?  No.  Current Medications: Current Outpatient Medications  Medication Sig Dispense Refill  . atorvastatin (LIPITOR) 40 MG tablet Take 1 tablet (40 mg total) by mouth daily. 30 tablet 2  . B-D INS SYRINGE 0.5CC/31GX5/16 31G X 5/16" 0.5 ML MISC     . BYDUREON 2 MG PEN Inject 2 mg into the skin once a week. SATURDAY    . carvedilol (COREG) 12.5 MG tablet TAKE 1 TABLET (12.5 MG TOTAL) BY MOUTH 2 (TWO) TIMES DAILY. 60  tablet 11  . clopidogrel (PLAVIX) 75 MG tablet Take 1 tablet (75 mg total) by mouth daily. 30 tablet 2  . diclofenac (VOLTAREN) 75 MG EC tablet TAKE 1 TABLET BY MOUTH TWICE A DAY AS NEEDED 60 tablet 2  . gabapentin (NEURONTIN) 300 MG capsule Take 300 mg by mouth daily.     Marland Kitchen glyBURIDE-metformin (GLUCOVANCE) 5-500 MG per tablet Take 2 tablets by mouth 2 (two) times daily.     Marland Kitchen loratadine (CLARITIN) 10 MG tablet Take 1 tablet by mouth daily.    Marland Kitchen losartan (COZAAR) 50 MG tablet TAKE 1 TABLET BY MOUTH EVERY DAY 90 tablet 2  . NOVOLIN 70/30 RELION (70-30) 100 UNIT/ML injection Inject 40-70 Units into the skin 2 (two) times daily with a meal. 40units-AM,  70UNITS-PM    . PARoxetine (PAXIL) 40 MG tablet Take 1 tablet (40 mg total) by mouth daily. 90 tablet 1  . spironolactone (ALDACTONE) 25 MG tablet Take 12.5 mg by mouth daily.      No current facility-administered medications for this visit.     Medical Decision Making:  Established Problem, Stable/Improving (1), Review of Psycho-Social Stressors (1) and Review of Last Therapy Session (1)  Treatment Plan Summary:Medication management    Paxil 40 mg by mouth daily at bedtime to help with the depression. Continue gabapentin. Follow-up in 2 months or earlier depending on her symptoms   More than 50% of the time spent in psychoeducation, counseling and coordination of care.     This note was generated in part or whole with voice recognition software. Voice regonition is usually quite accurate but there are transcription errors that can and very often do occur. I apologize for any typographical errors that were not detected and corrected.   Rainey Pines, MD

## 2017-10-20 LAB — CUP PACEART REMOTE DEVICE CHECK
Implantable Pulse Generator Implant Date: 20170627
MDC IDC SESS DTM: 20190802213842

## 2017-10-24 DIAGNOSIS — I639 Cerebral infarction, unspecified: Secondary | ICD-10-CM | POA: Diagnosis not present

## 2017-10-24 DIAGNOSIS — Z8673 Personal history of transient ischemic attack (TIA), and cerebral infarction without residual deficits: Secondary | ICD-10-CM | POA: Diagnosis not present

## 2017-11-04 LAB — CUP PACEART REMOTE DEVICE CHECK
Date Time Interrogation Session: 20190904220744
MDC IDC PG IMPLANT DT: 20170627

## 2017-11-08 DIAGNOSIS — I700 Atherosclerosis of aorta: Secondary | ICD-10-CM | POA: Insufficient documentation

## 2017-11-14 ENCOUNTER — Ambulatory Visit (INDEPENDENT_AMBULATORY_CARE_PROVIDER_SITE_OTHER): Payer: Medicare Other | Admitting: *Deleted

## 2017-11-14 DIAGNOSIS — I639 Cerebral infarction, unspecified: Secondary | ICD-10-CM | POA: Diagnosis not present

## 2017-11-15 ENCOUNTER — Ambulatory Visit (INDEPENDENT_AMBULATORY_CARE_PROVIDER_SITE_OTHER): Payer: Medicare Other | Admitting: Family Medicine

## 2017-11-15 ENCOUNTER — Encounter: Payer: Self-pay | Admitting: Family Medicine

## 2017-11-15 ENCOUNTER — Ambulatory Visit
Admission: RE | Admit: 2017-11-15 | Discharge: 2017-11-15 | Disposition: A | Discharge status: Home / Self Care | Payer: Medicare Other | Source: Ambulatory Visit | Attending: Family Medicine | Admitting: Family Medicine

## 2017-11-15 ENCOUNTER — Other Ambulatory Visit: Payer: Self-pay

## 2017-11-15 VITALS — BP 142/70 | HR 72 | Temp 97.8°F | Ht 66.0 in | Wt 231.6 lb

## 2017-11-15 DIAGNOSIS — I69398 Other sequelae of cerebral infarction: Secondary | ICD-10-CM | POA: Diagnosis not present

## 2017-11-15 DIAGNOSIS — K8020 Calculus of gallbladder without cholecystitis without obstruction: Secondary | ICD-10-CM | POA: Insufficient documentation

## 2017-11-15 DIAGNOSIS — I639 Cerebral infarction, unspecified: Secondary | ICD-10-CM

## 2017-11-15 DIAGNOSIS — M5146 Schmorl's nodes, lumbar region: Secondary | ICD-10-CM | POA: Diagnosis not present

## 2017-11-15 DIAGNOSIS — Z23 Encounter for immunization: Secondary | ICD-10-CM | POA: Diagnosis not present

## 2017-11-15 DIAGNOSIS — I700 Atherosclerosis of aorta: Secondary | ICD-10-CM | POA: Insufficient documentation

## 2017-11-15 DIAGNOSIS — M545 Low back pain, unspecified: Secondary | ICD-10-CM

## 2017-11-15 DIAGNOSIS — S3992XA Unspecified injury of lower back, initial encounter: Secondary | ICD-10-CM | POA: Diagnosis not present

## 2017-11-15 DIAGNOSIS — R29890 Loss of height: Secondary | ICD-10-CM | POA: Insufficient documentation

## 2017-11-15 DIAGNOSIS — R2689 Other abnormalities of gait and mobility: Secondary | ICD-10-CM | POA: Diagnosis not present

## 2017-11-15 MED ORDER — TIZANIDINE HCL 4 MG PO TABS: 4.0000 mg | ORAL_TABLET | Freq: Four times a day (QID) | ORAL | 0 refills | Status: DC | PRN

## 2017-11-15 NOTE — Progress Notes (Signed)
Patient: Jocelyn Wells Female    DOB: [birth year]   62 y.o.   MRN: [id number] Visit Date: 11/15/2017  Today's Provider: Vernie Murders, PA   Chief Complaint  Patient presents with  . Back Pain    from a fall on 10/26/17  . Leg Pain   Subjective:    HPI pt reports falling on 10/26/17 while outside and went to get up and she lost her balance and fell on cement on right side and she has pain in right hip area and around lower back with muscle spasms.  Pt reports she has had trouble walking, sleeping , standing since the fall.     Past Medical History:  Diagnosis Date  . Anxiety   . Cardiomyopathy [facility])    new to her Jan 2017  . COPD (chronic obstructive pulmonary disease) ([place])   . Depression   . Diabetes mellitus, type II ([place])   . HTN (hypertension)   . Obesity   . PONV (postoperative nausea and vomiting)   . Stroke [facility])    Jan 2017   Past Surgical History:  Procedure Laterality Date  . ABDOMINAL HYSTERECTOMY    . ANKLE FRACTURE SURGERY Left 2002  . BILATERAL SALPINGOOPHORECTOMY  2000  . CARDIAC CATHETERIZATION N/A 03/25/2015   Procedure: Right/Left Heart Cath and Coronary Angiography;  Surgeon: Belva Crome, MD;  Location: [place] CV LAB;  Service: Cardiovascular;  Laterality: N/A;  . [place]  . COLONOSCOPY WITH PROPOFOL N/A 09/22/2016   Procedure: COLONOSCOPY WITH PROPOFOL;  Surgeon: Manya Silvas, MD;  Location: [facility] ENDOSCOPY;  Service: Endoscopy;  Laterality: N/A;  . EP IMPLANTABLE DEVICE N/A 08/05/2015   Procedure: Loop Recorder Insertion;  Surgeon: Deboraha Sprang, MD;  Location: [place] CV LAB;  Service: Cardiovascular;  Laterality: N/A;  . ESOPHAGOGASTRODUODENOSCOPY (EGD) WITH PROPOFOL N/A 09/22/2016   Procedure: ESOPHAGOGASTRODUODENOSCOPY (EGD) WITH PROPOFOL;  Surgeon: Manya Silvas, MD;  Location: [facility] ENDOSCOPY;  Service: Endoscopy;  Laterality: N/A;  . ESOPHAGOGASTRODUODENOSCOPY (EGD) WITH PROPOFOL N/A 12/08/2016   Procedure: ESOPHAGOGASTRODUODENOSCOPY (EGD) WITH PROPOFOL;  Surgeon: Manya Silvas, MD;  Location: [facility] ENDOSCOPY;  Service: Endoscopy;  Laterality: N/A;  . KNEE ARTHROSCOPY Left 2005  . TEE WITHOUT CARDIOVERSION N/A 01/31/2015   Procedure: TRANSESOPHAGEAL ECHOCARDIOGRAM (TEE);  Surgeon: Lelon Perla, MD;  Location: [place];  Service: Cardiovascular;  Laterality: N/A;  . [place]  . ULNAR NERVE TRANSPOSITION  2008   Family History  Problem Relation Age of Onset  . Heart disease Mother        died from CHF  . Asthma Mother   . Diabetes Mother   . Heart disease Father   . Aneurysm Father   . COPD Brother   . Diabetes Brother   . Alcohol abuse Paternal Aunt   . Anemia Neg Hx   . Arrhythmia Neg Hx   . Clotting disorder Neg Hx   . Fainting Neg Hx   . Heart attack Neg Hx   . Heart failure Neg Hx   . Hyperlipidemia Neg Hx   . Hypertension Neg Hx     Allergies  Allergen Reactions  . Codeine Nausea And Vomiting    Current Outpatient Medications:  .  atorvastatin (LIPITOR) 40 MG tablet, Take 1 tablet (40 mg total) by mouth daily., Disp: 30 tablet, Rfl: 2 .  B-D INS SYRINGE 0.5CC/31GX5/16 31G X 5/16" 0.5 ML MISC, , Disp: , Rfl:  .  BYDUREON 2 MG PEN, Inject 2 mg into the skin once a week. SATURDAY, Disp: , Rfl:  .  carvedilol (COREG) 12.5 MG tablet, TAKE 1 TABLET (12.5 MG TOTAL) BY MOUTH 2 (TWO) TIMES DAILY., Disp: 60 tablet, Rfl: 11 .  clopidogrel (PLAVIX) 75 MG tablet, Take 1 tablet (75 mg total) by mouth daily., Disp: 30 tablet, Rfl: 2 .  diclofenac (VOLTAREN) 75 MG EC tablet, TAKE 1 TABLET BY MOUTH TWICE A DAY AS NEEDED, Disp: 60 tablet, Rfl: 2 .  gabapentin (NEURONTIN) 300 MG capsule, Take 1 capsule (300 mg total) by mouth daily., Disp: 30 capsule, Rfl: 11 .  glyBURIDE-metformin (GLUCOVANCE) 5-500 MG per tablet, Take 2 tablets by mouth 2 (two) times daily. , Disp: , Rfl:  .  loratadine (CLARITIN) 10 MG tablet, Take 1 tablet by mouth daily., Disp: , Rfl:    .  losartan (COZAAR) 50 MG tablet, TAKE 1 TABLET BY MOUTH EVERY DAY, Disp: 90 tablet, Rfl: 2 .  NOVOLIN 70/30 RELION (70-30) 100 UNIT/ML injection, Inject 40-70 Units into the skin 2 (two) times daily with a meal. 40units-AM,  70UNITS-PM, Disp: , Rfl:  .  PARoxetine (PAXIL) 40 MG tablet, Take 1 tablet (40 mg total) by mouth daily., Disp: 90 tablet, Rfl: 1 .  spironolactone (ALDACTONE) 25 MG tablet, Take 12.5 mg by mouth daily. , Disp: , Rfl:   Review of Systems  Constitutional: Negative.   HENT: Negative.   Eyes: Negative.   Respiratory: Negative.   Cardiovascular: Negative.   Genitourinary: Negative.   Musculoskeletal: Positive for back pain.  Skin: Negative.   Neurological: Positive for dizziness and light-headedness. Negative for weakness.       Associated with hypoglycemic episodes.    Social History   Tobacco Use  . Smoking status: Current Every Day Smoker    Packs/day: 1.00    Years: 35.00    Pack years: 35.00    Types: Cigarettes    Start date: 02/08/1974  . Smokeless tobacco: Never Used  . Tobacco comment: "I can not quit smoking, I have tried". 1 PPD  Substance Use Topics  . Alcohol use: No    Alcohol/week: 0.0 standard drinks   Objective:   BP (!) 142/70 (BP Location: Right Arm, Patient Position: Sitting, Cuff Size: Large)   Pulse 72   Temp 97.8 F (36.6 C) (Oral)   Ht 5\' 6"  (1.676 m)   Wt 231 lb 9.6 oz (105.1 kg)   SpO2 96%   BMI 37.38 kg/m  Vitals:   11/15/17 1042  BP: (!) 142/70  Pulse: 72  Temp: 97.8 F (36.6 C)  TempSrc: Oral  SpO2: 96%  Weight: 231 lb 9.6 oz (105.1 kg)  Height: 5\' 6"  (1.676 m)   Physical Exam  Constitutional: She is oriented to person, place, and time. She appears well-developed and well-nourished. No distress.  HENT:  Head: Normocephalic and atraumatic.  Right Ear: Hearing normal.  Left Ear: Hearing normal.  Nose: Nose normal.  Eyes: Conjunctivae and lids are normal. Right eye exhibits no discharge. Left eye exhibits no  discharge. No scleral icterus.  Cardiovascular: Normal rate and regular rhythm.  Pulmonary/Chest: Effort normal and breath sounds normal. No respiratory distress.  Abdominal: Soft. Bowel sounds are normal.  Musculoskeletal: She exhibits tenderness.  Low back pain with muscle tenderness to palpation. Spasms with certain movements or lying on the right side. Poor balance since CVA that affected the right arm and leg.  Neurological: She is alert and oriented to person, place,  and time.  Skin: Skin is intact. No lesion and no rash noted.  Psychiatric: She has a normal mood and affect. Her speech is normal and behavior is normal. Thought content normal.      Assessment & Plan:     1. Acute bilateral low back pain without sciatica Went outside to let her cat out and did not take her cane. Stood up after sitting for a while, got off balance and fell onto the right thigh. No dizziness or LOC. Balance has not been good since her CVA 10-06-17. Was not able to get up without a couple people helping her. She decided against going to the ER for evaluation. Having spasms in low back and around pelvis/hips. Recommend ice packs and given Zanaflex for spasms. Will get x-ray evaluation to rule out any vertebral fracture/compression. Recheck pending reports. - DG Lumbar Spine Complete - tiZANidine (ZANAFLEX) 4 MG tablet; Take 1 tablet (4 mg total) by mouth every 6 (six) hours as needed for muscle spasms.  Dispense: 30 tablet; Refill: 0  2. Need for influenza vaccination - Flu Vaccine QUAD 6+ mos PF IM (Fluarix Quad PF)  3. Impaired balance as late effect of cerebrovascular accident Balance has not been good since CVA on 10-06-17. Speech has been improving and no significant unilateral weakness. Usually uses a cane for support and balance, but, did not use it the day she fell. Encouraged to use cane every time she gets up to walk.       Vernie Murders, PA  [facility]

## 2017-11-16 ENCOUNTER — Telehealth: Payer: Self-pay

## 2017-11-16 NOTE — Telephone Encounter (Signed)
Pt returned missed call. Needing Xray results asap.  Thanks, [employer]

## 2017-11-16 NOTE — Progress Notes (Signed)
Carelink Summary Report / Loop Recorder 

## 2017-11-17 ENCOUNTER — Other Ambulatory Visit: Payer: Self-pay | Admitting: Family Medicine

## 2017-11-17 ENCOUNTER — Telehealth: Payer: Self-pay

## 2017-11-17 DIAGNOSIS — S22000A Wedge compression fracture of unspecified thoracic vertebra, initial encounter for closed fracture: Secondary | ICD-10-CM | POA: Diagnosis not present

## 2017-11-17 DIAGNOSIS — E1165 Type 2 diabetes mellitus with hyperglycemia: Secondary | ICD-10-CM | POA: Diagnosis not present

## 2017-11-17 DIAGNOSIS — R809 Proteinuria, unspecified: Secondary | ICD-10-CM | POA: Diagnosis not present

## 2017-11-17 DIAGNOSIS — E1142 Type 2 diabetes mellitus with diabetic polyneuropathy: Secondary | ICD-10-CM | POA: Diagnosis not present

## 2017-11-17 DIAGNOSIS — M810 Age-related osteoporosis without current pathological fracture: Secondary | ICD-10-CM | POA: Diagnosis not present

## 2017-11-17 DIAGNOSIS — F17200 Nicotine dependence, unspecified, uncomplicated: Secondary | ICD-10-CM | POA: Diagnosis not present

## 2017-11-17 DIAGNOSIS — E1159 Type 2 diabetes mellitus with other circulatory complications: Secondary | ICD-10-CM | POA: Diagnosis not present

## 2017-11-17 DIAGNOSIS — E1129 Type 2 diabetes mellitus with other diabetic kidney complication: Secondary | ICD-10-CM | POA: Diagnosis not present

## 2017-11-17 DIAGNOSIS — E113393 Type 2 diabetes mellitus with moderate nonproliferative diabetic retinopathy without macular edema, bilateral: Secondary | ICD-10-CM | POA: Diagnosis not present

## 2017-11-17 DIAGNOSIS — Z794 Long term (current) use of insulin: Secondary | ICD-10-CM | POA: Diagnosis not present

## 2017-11-17 DIAGNOSIS — Z780 Asymptomatic menopausal state: Secondary | ICD-10-CM | POA: Diagnosis not present

## 2017-11-17 DIAGNOSIS — I700 Atherosclerosis of aorta: Secondary | ICD-10-CM | POA: Diagnosis not present

## 2017-11-17 DIAGNOSIS — M545 Low back pain, unspecified: Secondary | ICD-10-CM

## 2017-11-17 DIAGNOSIS — E1122 Type 2 diabetes mellitus with diabetic chronic kidney disease: Secondary | ICD-10-CM | POA: Diagnosis not present

## 2017-11-17 NOTE — Telephone Encounter (Signed)
Pt states she is wanting a referral and states a message was left for her to call [facility] back.

## 2017-11-17 NOTE — Telephone Encounter (Signed)
LVMTRC.,PC 

## 2017-11-17 NOTE — Telephone Encounter (Signed)
-----   Message from Margo Common, [place] sent at 11/15/2017  6:31 PM EDT ----- Radiologist feels the T12 vertebra has a mild compression. If no better with use of new medication, will need referral to a spine specialist. Use ice pack or heating pad prn with medication for the next 14 days.

## 2017-11-17 NOTE — Telephone Encounter (Signed)
Patient was advised and would like a referral to a spinal specialist.

## 2017-11-17 NOTE — Telephone Encounter (Signed)
Patient agree with referral and please order.

## 2017-11-17 NOTE — Telephone Encounter (Signed)
Done

## 2017-11-18 NOTE — Telephone Encounter (Signed)
Patient returned call and was advised.PC

## 2017-11-21 ENCOUNTER — Other Ambulatory Visit: Payer: Self-pay | Admitting: Family Medicine

## 2017-11-21 ENCOUNTER — Telehealth: Payer: Self-pay | Admitting: Family Medicine

## 2017-11-21 DIAGNOSIS — M545 Low back pain, unspecified: Secondary | ICD-10-CM

## 2017-11-21 MED ORDER — TIZANIDINE HCL 4 MG PO TABS: 4.0000 mg | ORAL_TABLET | Freq: Four times a day (QID) | ORAL | 0 refills | Status: DC | PRN

## 2017-11-21 NOTE — Telephone Encounter (Signed)
Pt needs refill on tizanidine [facility]

## 2017-11-21 NOTE — Telephone Encounter (Signed)
Patient is requesting a refill on the following medication  tiZANidine (ZANAFLEX) 4 MG tablet   She states that she will be out of this sometime tomorrow and she hurts really bad without it.  She did try not to take it and the pain was too bad without it.   [facility] [place].

## 2017-11-21 NOTE — Telephone Encounter (Signed)
Done

## 2017-11-28 DIAGNOSIS — M5416 Radiculopathy, lumbar region: Secondary | ICD-10-CM | POA: Diagnosis not present

## 2017-11-28 LAB — CUP PACEART REMOTE DEVICE CHECK
Date Time Interrogation Session: 20191007223940
MDC IDC PG IMPLANT DT: 20170627

## 2017-12-01 ENCOUNTER — Other Ambulatory Visit: Payer: Self-pay | Admitting: Orthopedic Surgery

## 2017-12-01 ENCOUNTER — Ambulatory Visit: Payer: Medicare Other

## 2017-12-01 DIAGNOSIS — M5416 Radiculopathy, lumbar region: Secondary | ICD-10-CM

## 2017-12-12 ENCOUNTER — Ambulatory Visit (INDEPENDENT_AMBULATORY_CARE_PROVIDER_SITE_OTHER): Payer: Medicare Other | Admitting: Psychiatry

## 2017-12-12 ENCOUNTER — Other Ambulatory Visit: Payer: Self-pay

## 2017-12-12 ENCOUNTER — Encounter: Payer: Self-pay | Admitting: Psychiatry

## 2017-12-12 VITALS — BP 135/79 | HR 79 | Temp 97.6°F | Wt 227.1 lb

## 2017-12-12 DIAGNOSIS — I639 Cerebral infarction, unspecified: Secondary | ICD-10-CM

## 2017-12-12 DIAGNOSIS — F0630 Mood disorder due to known physiological condition, unspecified: Secondary | ICD-10-CM | POA: Diagnosis not present

## 2017-12-12 NOTE — Progress Notes (Signed)
BH MD/PA/NP OP Progress Note  12/12/2017 1:09 PM Jocelyn Wells  MRN:  [id number]  Subjective:   Pt is  a 62 -year-old female who presented for follow-up appointment. Patient reported that she fell recently hit her back.  Patient reported that she has been having back problems since then.  She has an appointment next week as she is having difficulty walking.  She reported that she might have a fracture in her back.  Patient reported that she did not trip and fell.  She stated that she is going to have the evaluation done with the orthopedics and they will decide about the treatment at that time.  Patient reported that she is taking muscle relaxant at this time.  She appeared tired during the interview.  She denied having any side effects of her current medication.  She has been taking them as prescribed.  Discussed about her medications in detail.    She is sleeping well at night.  No acute symptoms noted.  Denied having any suicidal homicidal ideations or plans.       Chief Complaint:   Visit Diagnosis:     ICD-10-CM   1. Mood disorder in conditions classified elsewhere F06.30     Past Medical History:  Past Medical History:  Diagnosis Date  . Anxiety   . Cardiomyopathy [facility])    new to her Jan 2017  . COPD (chronic obstructive pulmonary disease) ([place])   . Depression   . Diabetes mellitus, type II ([place])   . HTN (hypertension)   . Obesity   . PONV (postoperative nausea and vomiting)   . Stroke [facility])    Jan 2017    Past Surgical History:  Procedure Laterality Date  . ABDOMINAL HYSTERECTOMY    . ANKLE FRACTURE SURGERY Left 2002  . BILATERAL SALPINGOOPHORECTOMY  2000  . CARDIAC CATHETERIZATION N/A 03/25/2015   Procedure: Right/Left Heart Cath and Coronary Angiography;  Surgeon: Belva Crome, MD;  Location: [place] CV LAB;  Service: Cardiovascular;  Laterality: N/A;  . [place]  . COLONOSCOPY WITH PROPOFOL N/A 09/22/2016   Procedure: COLONOSCOPY WITH PROPOFOL;   Surgeon: Manya Silvas, MD;  Location: [facility] ENDOSCOPY;  Service: Endoscopy;  Laterality: N/A;  . EP IMPLANTABLE DEVICE N/A 08/05/2015   Procedure: Loop Recorder Insertion;  Surgeon: Deboraha Sprang, MD;  Location: [place] CV LAB;  Service: Cardiovascular;  Laterality: N/A;  . ESOPHAGOGASTRODUODENOSCOPY (EGD) WITH PROPOFOL N/A 09/22/2016   Procedure: ESOPHAGOGASTRODUODENOSCOPY (EGD) WITH PROPOFOL;  Surgeon: Manya Silvas, MD;  Location: [facility] ENDOSCOPY;  Service: Endoscopy;  Laterality: N/A;  . ESOPHAGOGASTRODUODENOSCOPY (EGD) WITH PROPOFOL N/A 12/08/2016   Procedure: ESOPHAGOGASTRODUODENOSCOPY (EGD) WITH PROPOFOL;  Surgeon: Manya Silvas, MD;  Location: [facility] ENDOSCOPY;  Service: Endoscopy;  Laterality: N/A;  . KNEE ARTHROSCOPY Left 2005  . TEE WITHOUT CARDIOVERSION N/A 01/31/2015   Procedure: TRANSESOPHAGEAL ECHOCARDIOGRAM (TEE);  Surgeon: Lelon Perla, MD;  Location: [place];  Service: Cardiovascular;  Laterality: N/A;  . [place]  . ULNAR NERVE TRANSPOSITION  2008   Family History:  Family History  Problem Relation Age of Onset  . Heart disease Mother        died from CHF  . Asthma Mother   . Diabetes Mother   . Heart disease Father   . Aneurysm Father   . COPD Brother   . Diabetes Brother   . Alcohol abuse Paternal Aunt   . Anemia Neg Hx   . Arrhythmia Neg  Hx   . Clotting disorder Neg Hx   . Fainting Neg Hx   . Heart attack Neg Hx   . Heart failure Neg Hx   . Hyperlipidemia Neg Hx   . Hypertension Neg Hx    Social History:  Social History   Socioeconomic History  . Marital status: Married    Spouse name: Not on file  . Number of children: 2  . Years of education: Not on file  . Highest education level: Bachelor's degree (e.g., BA, AB, BS)  Occupational History  . Occupation: disabled    Comment: retired  Scientific laboratory technician  . Financial resource strain: Not hard at all  . Food insecurity:    Worry: Never true    Inability: Never true   . Transportation needs:    Medical: No    Non-medical: No  Tobacco Use  . Smoking status: Current Every Day Smoker    Packs/day: 1.00    Years: 35.00    Pack years: 35.00    Types: Cigarettes    Start date: 02/08/1974  . Smokeless tobacco: Never Used  . Tobacco comment: "I can not quit smoking, I have tried". 1 PPD  Substance and Sexual Activity  . Alcohol use: No    Alcohol/week: 0.0 standard drinks  . Drug use: No  . Sexual activity: Yes    Birth control/protection: None  Lifestyle  . Physical activity:    Days per week: 0 days    Minutes per session: 0 min  . Stress: Only a little  Relationships  . Social connections:    Talks on phone: More than three times a week    Gets together: Once a week    Attends religious service: Never    Active member of club or organization: Yes    Attends meetings of clubs or organizations: Never    Relationship status: Married  Other Topics Concern  . Not on file  Social History Narrative   Lives at home with stepson and husband, dogs and cats   Caffeine  Drinks sweet tea.   Right handed.    Additional History:  Married x 18 years. Has 2 children and Her husband has 2 children as well Assessment:   Musculoskeletal: Strength & Muscle Tone: within normal limits Gait & Station: normal Patient leans: N/A  Psychiatric Specialty Exam: Medication Refill  Associated symptoms include weakness. Pertinent negatives include no chills, coughing, headaches, nausea, rash or vomiting.    Review of Systems  Constitutional: Positive for malaise/fatigue. Negative for chills and weight loss.  HENT: Negative for nosebleeds and tinnitus.   Eyes: Negative for double vision and discharge.  Respiratory: Negative for cough.   Cardiovascular: Negative for orthopnea.  Gastrointestinal: Negative for blood in stool, diarrhea, nausea and vomiting.  Genitourinary: Negative for urgency.  Musculoskeletal: Positive for back pain.  Skin: Negative for rash.   Neurological: Positive for dizziness and weakness. Negative for tingling, sensory change and headaches.  Endo/Heme/Allergies: Negative for environmental allergies.  Psychiatric/Behavioral: Negative for depression.  All other systems reviewed and are negative.   There were no vitals taken for this visit.There is no height or weight on file to calculate BMI.  General Appearance: Casual  Eye Contact:  Fair  Speech:  Slow  Volume:  Normal  Mood:  Anxious  Affect:  Congruent  Thought Process:  Coherent  Orientation:  Full (Time, Place, and Person)  Thought Content:  WDL  Suicidal Thoughts:  No  Homicidal Thoughts:  No  Memory:  Immediate;   Fair  Judgement:  Fair  Insight:  Fair  Psychomotor Activity:  Normal  Concentration:  Fair  Recall:  [employer] of Knowledge: Fair  Language: Fair  Akathisia:  No  Handed:  Right  AIMS (if indicated):  none  Assets:  Communication Skills Desire for Improvement Physical Health Social Support  ADL's:  Intact  Cognition: WNL  Sleep:  6-7   Is the patient at risk to self?  No. Has the patient been a risk to self in the past 6 months?  No. Has the patient been a risk to self within the distant past?  No. Is the patient a risk to others?  No. Has the patient been a risk to others in the past 6 months?  No. Has the patient been a risk to others within the distant past?  No.  Current Medications: Current Outpatient Medications  Medication Sig Dispense Refill  . atorvastatin (LIPITOR) 40 MG tablet Take 1 tablet (40 mg total) by mouth daily. 30 tablet 2  . B-D INS SYRINGE 0.5CC/31GX5/16 31G X 5/16" 0.5 ML MISC     . BYDUREON 2 MG PEN Inject 2 mg into the skin once a week. SATURDAY    . carvedilol (COREG) 12.5 MG tablet TAKE 1 TABLET (12.5 MG TOTAL) BY MOUTH 2 (TWO) TIMES DAILY. 60 tablet 11  . clopidogrel (PLAVIX) 75 MG tablet Take 1 tablet (75 mg total) by mouth daily. 30 tablet 2  . diclofenac (VOLTAREN) 75 MG EC tablet TAKE 1 TABLET BY  MOUTH TWICE A DAY AS NEEDED 60 tablet 2  . gabapentin (NEURONTIN) 300 MG capsule Take 1 capsule (300 mg total) by mouth daily. 30 capsule 11  . glyBURIDE-metformin (GLUCOVANCE) 5-500 MG per tablet Take 2 tablets by mouth 2 (two) times daily.     Marland Kitchen loratadine (CLARITIN) 10 MG tablet Take 1 tablet by mouth daily.    Marland Kitchen losartan (COZAAR) 50 MG tablet TAKE 1 TABLET BY MOUTH EVERY DAY 90 tablet 2  . NOVOLIN 70/30 RELION (70-30) 100 UNIT/ML injection Inject 40-70 Units into the skin 2 (two) times daily with a meal. 40units-AM,  70UNITS-PM    . PARoxetine (PAXIL) 40 MG tablet Take 1 tablet (40 mg total) by mouth daily. 90 tablet 1  . spironolactone (ALDACTONE) 25 MG tablet Take 12.5 mg by mouth daily.     Marland Kitchen tiZANidine (ZANAFLEX) 4 MG tablet Take 1 tablet (4 mg total) by mouth every 6 (six) hours as needed for muscle spasms. 30 tablet 0   No current facility-administered medications for this visit.     Medical Decision Making:  Established Problem, Stable/Improving (1), Review of Psycho-Social Stressors (1) and Review of Last Therapy Session (1)  Treatment Plan Summary:Medication management    Paxil 40 mg by mouth daily at bedtime to help with the depression. Continue gabapentin. Follow-up in 2 months or earlier depending on her symptoms   More than 50% of the time spent in psychoeducation, counseling and coordination of care.     This note was generated in part or whole with voice recognition software. Voice regonition is usually quite accurate but there are transcription errors that can and very often do occur. I apologize for any typographical errors that were not detected and corrected.   Rainey Pines, MD

## 2017-12-19 ENCOUNTER — Ambulatory Visit (INDEPENDENT_AMBULATORY_CARE_PROVIDER_SITE_OTHER): Payer: Medicare Other | Admitting: *Deleted

## 2017-12-19 DIAGNOSIS — I428 Other cardiomyopathies: Secondary | ICD-10-CM

## 2017-12-19 DIAGNOSIS — I639 Cerebral infarction, unspecified: Secondary | ICD-10-CM | POA: Diagnosis not present

## 2017-12-19 NOTE — Progress Notes (Signed)
Carelink Summary Report / Loop Recorder 

## 2017-12-20 ENCOUNTER — Ambulatory Visit
Admission: RE | Admit: 2017-12-20 | Discharge: 2017-12-20 | Disposition: A | Discharge status: Home / Self Care | Payer: Medicare Other | Source: Ambulatory Visit | Attending: Orthopedic Surgery | Admitting: Orthopedic Surgery

## 2017-12-20 DIAGNOSIS — M5416 Radiculopathy, lumbar region: Secondary | ICD-10-CM

## 2017-12-20 DIAGNOSIS — M48061 Spinal stenosis, lumbar region without neurogenic claudication: Secondary | ICD-10-CM | POA: Insufficient documentation

## 2017-12-20 DIAGNOSIS — M545 Low back pain: Secondary | ICD-10-CM | POA: Diagnosis not present

## 2017-12-20 DIAGNOSIS — X58XXXA Exposure to other specified factors, initial encounter: Secondary | ICD-10-CM | POA: Diagnosis not present

## 2017-12-20 DIAGNOSIS — M5116 Intervertebral disc disorders with radiculopathy, lumbar region: Secondary | ICD-10-CM | POA: Insufficient documentation

## 2017-12-20 DIAGNOSIS — S3992XA Unspecified injury of lower back, initial encounter: Secondary | ICD-10-CM | POA: Diagnosis not present

## 2017-12-20 DIAGNOSIS — S32019A Unspecified fracture of first lumbar vertebra, initial encounter for closed fracture: Secondary | ICD-10-CM | POA: Diagnosis not present

## 2017-12-20 DIAGNOSIS — S22089A Unspecified fracture of T11-T12 vertebra, initial encounter for closed fracture: Secondary | ICD-10-CM | POA: Insufficient documentation

## 2017-12-22 DIAGNOSIS — M5136 Other intervertebral disc degeneration, lumbar region: Secondary | ICD-10-CM | POA: Diagnosis not present

## 2017-12-26 ENCOUNTER — Encounter: Payer: Self-pay | Admitting: Dietician

## 2017-12-26 ENCOUNTER — Encounter: Payer: Medicare Other | Attending: Family Medicine | Admitting: Dietician

## 2017-12-26 VITALS — Ht 66.0 in | Wt 217.1 lb

## 2017-12-26 DIAGNOSIS — E119 Type 2 diabetes mellitus without complications: Secondary | ICD-10-CM | POA: Diagnosis not present

## 2017-12-26 DIAGNOSIS — Z713 Dietary counseling and surveillance: Secondary | ICD-10-CM | POA: Diagnosis not present

## 2017-12-26 DIAGNOSIS — Z794 Long term (current) use of insulin: Secondary | ICD-10-CM

## 2017-12-26 DIAGNOSIS — I10 Essential (primary) hypertension: Secondary | ICD-10-CM

## 2017-12-26 NOTE — Patient Instructions (Signed)
   Drink a protein drink mid- to late-morning and try to stay awake for 3-4 hours.   Gradually stay awake for longer periods of time during the day, which will let you sleep better during the night.   Eat a snack when awake for several hours during the night.   Take insulin doses about 12 hours apart to avoid risk for low blood sugar.   Aim to eat something, a snack or light meal, about every 4 hours when awake. Don't eat a large meal right before going to sleep.

## 2017-12-26 NOTE — Progress Notes (Signed)
Medical Nutrition Therapy: Visit start time: 1550  end time: 1710  Assessment:  Diagnosis: Type 2 diabetes, hypertension Past medical history: hyperlipidemia, gout, compression fracture in vertebra from recent fall, CVA Psychosocial issues/ stress concerns: patient reports some short-term memory loss after stroke several years ago.  Preferred learning method:  . Hands-on   Current weight: 217.1lbs Height: 5'6" Medications, supplements: reconciled list in medical record  Progress and evaluation: Patient and husband report history of hypoglycemic reactions when she was on a cruise and was not drinking sweet tea. She had been drinking sweet tea at home throughout each day. Insulin dose has since been reduced and she no longer has BG drops. Husband states patient generally sleeps during the day and is awake most of the night, watching TV. Patient states she does stay awake often during the day, when husband is at work. Supper is sometimes as late as 9pm, as patient sometimes sleeps in the evening.  She feels her pain medication is making her sleepy during the day. She is not eating breakfast, so is not taking her am insulin dose.   Physical activity: none due to vertebral fracture  Dietary Intake:  Usual eating pattern includes 1-2 meals and 1-2 snacks per day. Dining out frequency: 2 meals per week.  Breakfast: none; takes meds with small portion milk; sugar free tea Snack: usually none; no appetite Lunch: sometimes skips if sleeping (doesn't sleep well at night); soup, sandwich Snack: usually none Supper: 11/17 grilled pork chop, mashed potatoes, pinto beans; husband or son cook Snack: crackers, soup, peanut butter, chips and dip Beverages: sugar free iced tea  Nutrition Care Education: Topics covered: diabetes, hypertension, weight control Basic nutrition: basic food groups, appropriate nutrient balance, appropriate meal and snack schedule, general nutrition guidelines    Weight  control: benefits of weight control, plate method for planning balanced meals and controlling carbohydrate itnake Diabetes: appropriate meal and snack schedule, appropriate carb intake and balance, action and duration of insulin and importance of eating meals when taking insulin; benefits of consistent meal schedule; role of physical activity; strategies for achieving better consistency with schedule and staying awake during the day Hypertension: identifying high sodium foods, identifying food sources of potassium, magnesium   Nutritional Diagnosis:  [place]-2.2 Altered nutrition-related laboratory As related to type 2 diabetes.  As evidenced by recent HbA1C of 12.2%. -3.3 Overweight/obesity As related to inactivity and excess calories.  As evidenced by patient with current BMI of 35, and patient report of diet history and activity.  Intervention: Instruction as noted above.   Patient agrees to gradually work on shifting to daytime schedule as able.   Set goals with input from patient.    Patient and husband declined scheduling follow-up, but will schedule later if needed.   Education Materials given:  . General diet guidelines for Diabetes . Plate Planner with food lists . Sample meal pattern/ menus . Goals/ instructions  Learner/ who was taught:  . Patient  . Spouse/ partner  Level of understanding: Marland Kitchen Verbalizes/ demonstrates competency   Demonstrated degree of understanding via:   Teach back Learning barriers: . Short term memory loss per patient  Willingness to learn/ readiness for change: . Acceptance, ready for change  Monitoring and Evaluation:  Dietary intake, exercise, BG control, and body weight      follow up: prn

## 2018-01-10 DIAGNOSIS — L739 Follicular disorder, unspecified: Secondary | ICD-10-CM | POA: Diagnosis not present

## 2018-01-10 DIAGNOSIS — L720 Epidermal cyst: Secondary | ICD-10-CM | POA: Diagnosis not present

## 2018-01-17 DIAGNOSIS — L720 Epidermal cyst: Secondary | ICD-10-CM | POA: Diagnosis not present

## 2018-01-19 ENCOUNTER — Ambulatory Visit (INDEPENDENT_AMBULATORY_CARE_PROVIDER_SITE_OTHER): Payer: Medicare Other

## 2018-01-19 DIAGNOSIS — I639 Cerebral infarction, unspecified: Secondary | ICD-10-CM

## 2018-01-19 DIAGNOSIS — M4854XA Collapsed vertebra, not elsewhere classified, thoracic region, initial encounter for fracture: Secondary | ICD-10-CM | POA: Diagnosis not present

## 2018-01-19 DIAGNOSIS — M4856XA Collapsed vertebra, not elsewhere classified, lumbar region, initial encounter for fracture: Secondary | ICD-10-CM | POA: Diagnosis not present

## 2018-01-20 NOTE — Progress Notes (Signed)
Carelink Summary Report / Loop Recorder 

## 2018-01-27 DIAGNOSIS — Z8673 Personal history of transient ischemic attack (TIA), and cerebral infarction without residual deficits: Secondary | ICD-10-CM | POA: Diagnosis not present

## 2018-01-27 DIAGNOSIS — E785 Hyperlipidemia, unspecified: Secondary | ICD-10-CM | POA: Diagnosis not present

## 2018-01-27 DIAGNOSIS — Z794 Long term (current) use of insulin: Secondary | ICD-10-CM | POA: Diagnosis not present

## 2018-01-27 DIAGNOSIS — M4856XA Collapsed vertebra, not elsewhere classified, lumbar region, initial encounter for fracture: Secondary | ICD-10-CM | POA: Diagnosis not present

## 2018-01-27 DIAGNOSIS — M4854XA Collapsed vertebra, not elsewhere classified, thoracic region, initial encounter for fracture: Secondary | ICD-10-CM | POA: Diagnosis not present

## 2018-01-27 DIAGNOSIS — I82401 Acute embolism and thrombosis of unspecified deep veins of right lower extremity: Secondary | ICD-10-CM | POA: Diagnosis not present

## 2018-01-27 DIAGNOSIS — I82411 Acute embolism and thrombosis of right femoral vein: Secondary | ICD-10-CM | POA: Diagnosis not present

## 2018-01-27 DIAGNOSIS — E119 Type 2 diabetes mellitus without complications: Secondary | ICD-10-CM | POA: Diagnosis not present

## 2018-01-27 DIAGNOSIS — I10 Essential (primary) hypertension: Secondary | ICD-10-CM | POA: Diagnosis not present

## 2018-01-27 DIAGNOSIS — N183 Chronic kidney disease, stage 3 (moderate): Secondary | ICD-10-CM | POA: Diagnosis not present

## 2018-01-27 DIAGNOSIS — J449 Chronic obstructive pulmonary disease, unspecified: Secondary | ICD-10-CM | POA: Diagnosis not present

## 2018-02-02 ENCOUNTER — Ambulatory Visit (INDEPENDENT_AMBULATORY_CARE_PROVIDER_SITE_OTHER): Payer: Medicare Other | Admitting: Vascular Surgery

## 2018-02-02 ENCOUNTER — Encounter (INDEPENDENT_AMBULATORY_CARE_PROVIDER_SITE_OTHER): Payer: Self-pay | Admitting: Vascular Surgery

## 2018-02-02 VITALS — BP 152/84 | HR 84 | Resp 16 | Ht 66.0 in | Wt 217.6 lb

## 2018-02-02 DIAGNOSIS — I82411 Acute embolism and thrombosis of right femoral vein: Secondary | ICD-10-CM

## 2018-02-02 DIAGNOSIS — E113319 Type 2 diabetes mellitus with moderate nonproliferative diabetic retinopathy with macular edema, unspecified eye: Secondary | ICD-10-CM

## 2018-02-02 DIAGNOSIS — Z794 Long term (current) use of insulin: Secondary | ICD-10-CM

## 2018-02-02 DIAGNOSIS — E785 Hyperlipidemia, unspecified: Secondary | ICD-10-CM | POA: Diagnosis not present

## 2018-02-02 DIAGNOSIS — F17210 Nicotine dependence, cigarettes, uncomplicated: Secondary | ICD-10-CM | POA: Diagnosis not present

## 2018-02-02 DIAGNOSIS — I10 Essential (primary) hypertension: Secondary | ICD-10-CM

## 2018-02-02 NOTE — Progress Notes (Signed)
MRN : [id number]  Jocelyn Wells is a 62 y.o. (07/16/1955) female who presents with chief complaint of  Chief Complaint  Patient presents with  . New Patient (Initial Visit)  .  History of Present Illness:   The patient presents to the office for evaluation of DVT.  DVT was identified at [facility] by Duplex ultrasound.  The initial symptoms were pain and swelling in the lower extremity.  The patient notes the leg continues to be very painful with dependency and swells quite a bite.  Symptoms are much better with elevation.  The patient notes minimal edema in the morning which steadily worsens throughout the day.    She was evaluated at [facility] on 01/27/2018 and found to need a T12 and L1 kyphoplasty  The patient has not been using compression therapy at this point.  No SOB or pleuritic chest pains.  No cough or hemoptysis.  No blood per rectum or blood in any sputum.  No excessive bruising per the patient.   Current Meds  Medication Sig  . atorvastatin (LIPITOR) 40 MG tablet Take 1 tablet (40 mg total) by mouth daily.  . B-D INS SYRINGE 0.5CC/31GX5/16 31G X 5/16" 0.5 ML MISC   . BYDUREON 2 MG PEN Inject 2 mg into the skin once a week. SATURDAY  . calcipotriene (DOVONOX) 0.005 % cream calcipotriene 0.005 % topical cream  . carvedilol (COREG) 12.5 MG tablet TAKE 1 TABLET (12.5 MG TOTAL) BY MOUTH 2 (TWO) TIMES DAILY.  Marland Kitchen gabapentin (NEURONTIN) 300 MG capsule Take 1 capsule (300 mg total) by mouth daily.  Marland Kitchen glucose blood test strip OneTouch Verio strips  . glyBURIDE-metformin (GLUCOVANCE) 5-500 MG per tablet Take 2 tablets by mouth 2 (two) times daily.   Marland Kitchen loratadine (CLARITIN) 10 MG tablet Take 1 tablet by mouth daily.  Marland Kitchen losartan (COZAAR) 50 MG tablet TAKE 1 TABLET BY MOUTH EVERY DAY  . NOVOLIN 70/30 RELION (70-30) 100 UNIT/ML injection Inject 40-70 Units into the skin 2 (two) times daily with a meal. 40units-AM,  70UNITS-PM  . PARoxetine (PAXIL) 40 MG tablet Take  1 tablet (40 mg total) by mouth daily.  . rivaroxaban (XARELTO) 2.5 MG TABS tablet Take 2.5 mg by mouth daily.  . traMADol (ULTRAM) 50 MG tablet   . triamcinolone cream (KENALOG) 0.1 % triamcinolone acetonide 0.1 % topical cream  . [DISCONTINUED] tiZANidine (ZANAFLEX) 4 MG tablet Take 1 tablet (4 mg total) by mouth every 6 (six) hours as needed for muscle spasms.  . [DISCONTINUED] tiZANidine (ZANAFLEX) 4 MG tablet tizanidine 4 mg tablet  Take 1 tablet every 6 hours by oral route.    Past Medical History:  Diagnosis Date  . Anxiety   . Cardiomyopathy [facility])    new to her Jan 2017  . COPD (chronic obstructive pulmonary disease) ([place])   . Depression   . Diabetes mellitus, type II ([place])   . HTN (hypertension)   . Obesity   . PONV (postoperative nausea and vomiting)   . Stroke [facility])    Jan 2017    Past Surgical History:  Procedure Laterality Date  . ABDOMINAL HYSTERECTOMY    . ANKLE FRACTURE SURGERY Left 2002  . BILATERAL SALPINGOOPHORECTOMY  2000  . CARDIAC CATHETERIZATION N/A 03/25/2015   Procedure: Right/Left Heart Cath and Coronary Angiography;  Surgeon: Belva Crome, MD;  Location: [place] [facility];  Service: Cardiovascular;  Laterality: N/A;  . [place]  . COLONOSCOPY WITH PROPOFOL N/A  09/22/2016   Procedure: COLONOSCOPY WITH PROPOFOL;  Surgeon: Manya Silvas, MD;  Location: [facility] ENDOSCOPY;  Service: Endoscopy;  Laterality: N/A;  . EP IMPLANTABLE DEVICE N/A 08/05/2015   Procedure: Loop Recorder Insertion;  Surgeon: Deboraha Sprang, MD;  Location: [place] [facility];  Service: Cardiovascular;  Laterality: N/A;  . ESOPHAGOGASTRODUODENOSCOPY (EGD) WITH PROPOFOL N/A 09/22/2016   Procedure: ESOPHAGOGASTRODUODENOSCOPY (EGD) WITH PROPOFOL;  Surgeon: Manya Silvas, MD;  Location: [facility] ENDOSCOPY;  Service: Endoscopy;  Laterality: N/A;  . ESOPHAGOGASTRODUODENOSCOPY (EGD) WITH PROPOFOL N/A 12/08/2016   Procedure: ESOPHAGOGASTRODUODENOSCOPY (EGD) WITH PROPOFOL;   Surgeon: Manya Silvas, MD;  Location: [facility] ENDOSCOPY;  Service: Endoscopy;  Laterality: N/A;  . KNEE ARTHROSCOPY Left 2005  . TEE WITHOUT CARDIOVERSION N/A 01/31/2015   Procedure: TRANSESOPHAGEAL ECHOCARDIOGRAM (TEE);  Surgeon: Lelon Perla, MD;  Location: [place];  Service: Cardiovascular;  Laterality: N/A;  . [place]  . ULNAR NERVE TRANSPOSITION  2008    Social History Social History   Tobacco Use  . Smoking status: Current Every Day Smoker    Packs/day: 1.00    Years: 35.00    Pack years: 35.00    Types: Cigarettes    Start date: 02/08/1974  . Smokeless tobacco: Never Used  . Tobacco comment: "I can not quit smoking, I have tried". 1 PPD  Substance Use Topics  . Alcohol use: No    Alcohol/week: 0.0 standard drinks  . Drug use: No    Family History Family History  Problem Relation Age of Onset  . Heart disease Mother        died from CHF  . Asthma Mother   . Diabetes Mother   . Heart disease Father   . Aneurysm Father   . COPD Brother   . Diabetes Brother   . Alcohol abuse Paternal Aunt   . Anemia Neg Hx   . Arrhythmia Neg Hx   . Clotting disorder Neg Hx   . Fainting Neg Hx   . Heart attack Neg Hx   . Heart failure Neg Hx   . Hyperlipidemia Neg Hx   . Hypertension Neg Hx   No family history of bleeding/clotting disorders, porphyria or autoimmune disease   Allergies  Allergen Reactions  . Codeine Nausea And Vomiting     REVIEW OF SYSTEMS (Negative unless checked)  Constitutional: [] Weight loss  [] Fever  [] Chills Cardiac: [] Chest pain   [] Chest pressure   [] Palpitations   [] Shortness of breath when laying flat   [] Shortness of breath with exertion. Vascular:  [] Pain in legs with walking   [] Pain in legs at rest  [x] History of DVT   [] Phlebitis   [x] Swelling in legs   [] Varicose veins   [] Non-healing ulcers Pulmonary:   [] Uses home oxygen   [] Productive cough   [] Hemoptysis   [] Wheeze  [] COPD   [] Asthma Neurologic:  [] Dizziness    [] Seizures   [] History of stroke   [] History of TIA  [] Aphasia   [] Vissual changes   [] Weakness or numbness in arm   [] Weakness or numbness in leg Musculoskeletal:   [] Joint swelling   [x] Joint pain   [x] Low back pain Hematologic:  [] Easy bruising  [] Easy bleeding   [] Hypercoagulable state   [] Anemic Gastrointestinal:  [] Diarrhea   [] Vomiting  [] Gastroesophageal reflux/heartburn   [] Difficulty swallowing. Genitourinary:  [] Chronic kidney disease   [] Difficult urination  [] Frequent urination   [] Blood in urine Skin:  [] Rashes   [] Ulcers  Psychological:  [] History of anxiety   []   History of major depression.  Physical Examination  Vitals:   02/02/18 1503  BP: (!) 152/84  Pulse: 84  Resp: 16  Weight: 217 lb 9.6 oz (98.7 kg)  Height: 5\' 6"  (1.676 m)   Body mass index is 35.12 kg/m. Gen: WD/WN, NAD Head: [place]/AT, No temporalis wasting.  Ear/Nose/Throat: Hearing grossly intact, nares w/o erythema or drainage, poor dentition Eyes: PER, EOMI, sclera nonicteric.  Neck: Supple, no masses.  No bruit or JVD.  Pulmonary:  Good air movement, clear to auscultation bilaterally, no use of accessory muscles.  Cardiac: RRR, normal S1, S2, no Murmurs. Vascular: scattered varicosities present bilaterally.  Mild venous stasis changes to the legs bilaterally.  2-3+ hard pitting edema right leg Vessel Right Left  Radial Palpable Palpable  PT Palpable Palpable  DP Palpable Palpable  Gastrointestinal: soft, non-distended. No guarding/no peritoneal signs.  Musculoskeletal: M/S 5/5 throughout.  No deformity or atrophy.  Neurologic: CN 2-12 intact. Pain and light touch intact in extremities.  Symmetrical.  Speech is fluent. Motor exam as listed above. Psychiatric: Judgment intact, Mood & affect appropriate for pt's clinical situation. Dermatologic: Venous rashes no ulcers noted.  No changes consistent with cellulitis. Lymph : No Cervical lymphadenopathy, no lichenification or skin changes of chronic  lymphedema.  CBC Lab Results  Component Value Date   WBC 9.5 10/05/2017   HGB 11.2 (L) 10/05/2017   HCT 34.4 (L) 10/05/2017   MCV 84.5 10/05/2017   PLT 181 10/05/2017    BMET    Component Value Date/Time   NA 136 10/05/2017 1738   NA 142 02/11/2015 1247   K 4.8 10/05/2017 1738   CL 100 10/05/2017 1738   CO2 27 10/05/2017 1738   GLUCOSE 317 (H) 10/05/2017 1738   BUN 22 10/05/2017 1738   BUN 24 02/11/2015 1247   CREATININE 1.70 (H) 10/05/2017 1738   CREATININE 1.37 (H) 03/21/2015 1451   CALCIUM 8.4 (L) 10/05/2017 1738   GFRNONAA 31 (L) 10/05/2017 1738   GFRAA 36 (L) 10/05/2017 1738   CrCl cannot be calculated (Patient's most recent lab result is older than the maximum 21 days allowed.).  COAG Lab Results  Component Value Date   INR 0.93 10/05/2017   INR 1.05 03/21/2015   INR 1.13 01/31/2015    Radiology No results found.    Assessment/Plan 1. Deep vein thrombosis (DVT) of femoral vein of right lower extremity, unspecified chronicity (HCC) Recommend:   No surgery or intervention at this point in time.  IVC filter is not indicated at present.  Patient's duplex ultrasound of the venous system shows DVT from the popliteal to the femoral veins.  The patient is initiated on anticoagulation   Elevation was stressed, use of a recliner was discussed.  I have had a long discussion with the patient regarding DVT and post phlebitic changes such as swelling and why it  causes symptoms such as pain.  The patient will wear graduated compression stockings class 1 (20-30 mmHg), beginning after three full days of anticoagulation, on a daily basis a prescription was given. The patient will  beginning wearing the stockings first thing in the morning and removing them in the evening. The patient is instructed specifically not to sleep in the stockings.  In addition, behavioral modification including elevation during the day and avoidance of prolonged dependency will be initiated.      The patient will continue anticoagulation for now as there have not been any problems or complications at this point.   Timing of  the Kyphoplasties will be discussed with Dr Sharyne Richters   A total of 65 minutes was spent with this patient and greater than 50% was spent in counseling and coordination of care with the patient.  Discussion included the treatment options for vascular disease including indications for surgery and intervention.  Also discussed is the appropriate timing of treatment.  In addition medical therapy was discussed.  - VAS [place] LOWER EXTREMITY VENOUS (DVT); Future  2. Essential hypertension Continue antihypertensive medications as already ordered, these medications have been reviewed and there are no changes at this time.   3. Type 2 diabetes mellitus with moderate nonproliferative retinopathy and macular edema, with long-term current use of insulin, unspecified laterality ([place]) Continue hypoglycemic medications as already ordered, these medications have been reviewed and there are no changes at this time.  Hgb A1C to be monitored as already arranged by primary service   4. Hyperlipidemia, unspecified hyperlipidemia type Continue statin as ordered and reviewed, no changes at this time   [name], MD  02/02/2018 3:08 PM

## 2018-02-07 LAB — CUP PACEART REMOTE DEVICE CHECK
MDC IDC PG IMPLANT DT: 20170627
MDC IDC SESS DTM: 20191110010840

## 2018-02-09 ENCOUNTER — Encounter (INDEPENDENT_AMBULATORY_CARE_PROVIDER_SITE_OTHER): Payer: Self-pay | Admitting: Vascular Surgery

## 2018-02-09 DIAGNOSIS — I82509 Chronic embolism and thrombosis of unspecified deep veins of unspecified lower extremity: Secondary | ICD-10-CM | POA: Insufficient documentation

## 2018-02-09 DIAGNOSIS — I82409 Acute embolism and thrombosis of unspecified deep veins of unspecified lower extremity: Secondary | ICD-10-CM | POA: Insufficient documentation

## 2018-02-13 ENCOUNTER — Encounter: Payer: Self-pay | Admitting: Psychiatry

## 2018-02-13 ENCOUNTER — Ambulatory Visit (INDEPENDENT_AMBULATORY_CARE_PROVIDER_SITE_OTHER): Payer: Medicare Other | Admitting: Psychiatry

## 2018-02-13 DIAGNOSIS — F0630 Mood disorder due to known physiological condition, unspecified: Secondary | ICD-10-CM | POA: Diagnosis not present

## 2018-02-13 MED ORDER — PAROXETINE HCL 40 MG PO TABS: 40.0000 mg | ORAL_TABLET | Freq: Every day | ORAL | 1 refills | Status: DC

## 2018-02-13 NOTE — Progress Notes (Signed)
BH MD/PA/NP OP Progress Note  02/13/2018 12:25 PM Jocelyn Wells  MRN:  [id number]  Subjective:   Pt is  a 63 -year-old female who presented for follow-up appointment. Patient reported that she was recently diagnosed with embolism in her legs while she was going through evaluation for her back surgery.  She reported that her 1 of her legs were swollen and now she is taking Xarelto.  She reported that she is currently cold.  She reported that she is taking tramadol for the pain.  Her family is supportive and helping her.  She is feeling sick and tired results of the weather.  She appeared calm and alert during the interview.  She reported that she is compliant with her psychotropic medications.  No changes noted.  Patient denied having any suicidal homicidal ideations or plans.  She patient reported that she takes medications as prescribed and has been trying to keep herself warm and has been using the space heaters.  She stated that her husband and her son lives with her.  She does not have any other medical problems.  Discussed with her about her medications in detail and she agreed with the plan.    Patient denied having any suicidal homicidal ideations or plans.  She denied having any perceptual disturbances.       Chief Complaint:   Visit Diagnosis:     ICD-10-CM   1. Mood disorder in conditions classified elsewhere F06.30     Past Medical History:  Past Medical History:  Diagnosis Date  . Anxiety   . Cardiomyopathy [facility])    new to her Jan 2017  . COPD (chronic obstructive pulmonary disease) ([place])   . Depression   . Diabetes mellitus, type II ([place])   . HTN (hypertension)   . Obesity   . PONV (postoperative nausea and vomiting)   . Stroke [facility])    Jan 2017    Past Surgical History:  Procedure Laterality Date  . ABDOMINAL HYSTERECTOMY    . ANKLE FRACTURE SURGERY Left 2002  . BILATERAL SALPINGOOPHORECTOMY  2000  . CARDIAC CATHETERIZATION N/A 03/25/2015   Procedure: Right/Left  Heart Cath and Coronary Angiography;  Surgeon: Belva Crome, MD;  Location: [place] [facility] LAB;  Service: Cardiovascular;  Laterality: N/A;  . [place]  . COLONOSCOPY WITH PROPOFOL N/A 09/22/2016   Procedure: COLONOSCOPY WITH PROPOFOL;  Surgeon: Manya Silvas, MD;  Location: [facility] ENDOSCOPY;  Service: Endoscopy;  Laterality: N/A;  . EP IMPLANTABLE DEVICE N/A 08/05/2015   Procedure: Loop Recorder Insertion;  Surgeon: Deboraha Sprang, MD;  Location: [place] [facility] LAB;  Service: Cardiovascular;  Laterality: N/A;  . ESOPHAGOGASTRODUODENOSCOPY (EGD) WITH PROPOFOL N/A 09/22/2016   Procedure: ESOPHAGOGASTRODUODENOSCOPY (EGD) WITH PROPOFOL;  Surgeon: Manya Silvas, MD;  Location: [facility] ENDOSCOPY;  Service: Endoscopy;  Laterality: N/A;  . ESOPHAGOGASTRODUODENOSCOPY (EGD) WITH PROPOFOL N/A 12/08/2016   Procedure: ESOPHAGOGASTRODUODENOSCOPY (EGD) WITH PROPOFOL;  Surgeon: Manya Silvas, MD;  Location: [facility] ENDOSCOPY;  Service: Endoscopy;  Laterality: N/A;  . KNEE ARTHROSCOPY Left 2005  . TEE WITHOUT CARDIOVERSION N/A 01/31/2015   Procedure: TRANSESOPHAGEAL ECHOCARDIOGRAM (TEE);  Surgeon: Lelon Perla, MD;  Location: [place];  Service: Cardiovascular;  Laterality: N/A;  . [place]  . ULNAR NERVE TRANSPOSITION  2008   Family History:  Family History  Problem Relation Age of Onset  . Heart disease Mother        died from CHF  . Asthma Mother   .  Diabetes Mother   . Heart disease Father   . Aneurysm Father   . COPD Brother   . Diabetes Brother   . Alcohol abuse Paternal Aunt   . Anemia Neg Hx   . Arrhythmia Neg Hx   . Clotting disorder Neg Hx   . Fainting Neg Hx   . Heart attack Neg Hx   . Heart failure Neg Hx   . Hyperlipidemia Neg Hx   . Hypertension Neg Hx    Social History:  Social History   Socioeconomic History  . Marital status: Married    Spouse name: Not on file  . Number of children: 2  . Years of education: Not on file  . Highest  education level: Bachelor's degree (e.g., BA, AB, BS)  Occupational History  . Occupation: disabled    Comment: retired  Scientific laboratory technician  . Financial resource strain: Not hard at all  . Food insecurity:    Worry: Never true    Inability: Never true  . Transportation needs:    Medical: No    Non-medical: No  Tobacco Use  . Smoking status: Current Every Day Smoker    Packs/day: 1.00    Years: 35.00    Pack years: 35.00    Types: Cigarettes    Start date: 02/08/1974  . Smokeless tobacco: Never Used  . Tobacco comment: "I can not quit smoking, I have tried". 1 PPD  Substance and Sexual Activity  . Alcohol use: No    Alcohol/week: 0.0 standard drinks  . Drug use: No  . Sexual activity: Yes    Birth control/protection: None  Lifestyle  . Physical activity:    Days per week: 0 days    Minutes per session: 0 min  . Stress: Only a little  Relationships  . Social connections:    Talks on phone: More than three times a week    Gets together: Once a week    Attends religious service: Never    Active member of club or organization: Yes    Attends meetings of clubs or organizations: Never    Relationship status: Married  Other Topics Concern  . Not on file  Social History Narrative   Lives at home with stepson and husband, dogs and cats   Caffeine  Drinks sweet tea.   Right handed.    Additional History:  Married x 18 years. Has 2 children and Her husband has 2 children as well Assessment:   Musculoskeletal: Strength & Muscle Tone: within normal limits Gait & Station: normal Patient leans: N/A  Psychiatric Specialty Exam: Medication Refill  Associated symptoms include weakness. Pertinent negatives include no chills, coughing, headaches, nausea, rash or vomiting.    Review of Systems  Constitutional: Positive for malaise/fatigue. Negative for chills and weight loss.  HENT: Negative for nosebleeds and tinnitus.   Eyes: Negative for double vision and discharge.  Respiratory:  Negative for cough.   Cardiovascular: Negative for orthopnea.  Gastrointestinal: Negative for blood in stool, diarrhea, nausea and vomiting.  Genitourinary: Negative for urgency.  Musculoskeletal: Positive for back pain.  Skin: Negative for rash.  Neurological: Positive for dizziness and weakness. Negative for tingling, sensory change and headaches.  Endo/Heme/Allergies: Negative for environmental allergies.  Psychiatric/Behavioral: Negative for depression.  All other systems reviewed and are negative.   There were no vitals taken for this visit.There is no height or weight on file to calculate BMI.  General Appearance: Casual  Eye Contact:  Fair  Speech:  Slow  Volume:  Normal  Mood:  Anxious  Affect:  Congruent  Thought Process:  Coherent  Orientation:  Full (Time, Place, and Person)  Thought Content:  WDL  Suicidal Thoughts:  No  Homicidal Thoughts:  No  Memory:  Immediate;   Fair  Judgement:  Fair  Insight:  Fair  Psychomotor Activity:  Normal  Concentration:  Fair  Recall:  [employer] of Knowledge: Fair  Language: Fair  Akathisia:  No  Handed:  Right  AIMS (if indicated):  none  Assets:  Communication Skills Desire for Improvement Physical Health Social Support  ADL's:  Intact  Cognition: WNL  Sleep:  6-7   Is the patient at risk to self?  No. Has the patient been a risk to self in the past 6 months?  No. Has the patient been a risk to self within the distant past?  No. Is the patient a risk to others?  No. Has the patient been a risk to others in the past 6 months?  No. Has the patient been a risk to others within the distant past?  No.  Current Medications: Current Outpatient Medications  Medication Sig Dispense Refill  . atorvastatin (LIPITOR) 40 MG tablet Take 1 tablet (40 mg total) by mouth daily. 30 tablet 2  . B-D INS SYRINGE 0.5CC/31GX5/16 31G X 5/16" 0.5 ML MISC     . BYDUREON 2 MG PEN Inject 2 mg into the skin once a week. SATURDAY    .  calcipotriene (DOVONOX) 0.005 % cream calcipotriene 0.005 % topical cream    . carvedilol (COREG) 12.5 MG tablet TAKE 1 TABLET (12.5 MG TOTAL) BY MOUTH 2 (TWO) TIMES DAILY. 60 tablet 11  . gabapentin (NEURONTIN) 300 MG capsule Take 1 capsule (300 mg total) by mouth daily. 30 capsule 11  . glucose blood test strip OneTouch Verio strips    . glyBURIDE-metformin (GLUCOVANCE) 5-500 MG per tablet Take 2 tablets by mouth 2 (two) times daily.     Marland Kitchen loratadine (CLARITIN) 10 MG tablet Take 1 tablet by mouth daily.    Marland Kitchen losartan (COZAAR) 50 MG tablet TAKE 1 TABLET BY MOUTH EVERY DAY 90 tablet 2  . NOVOLIN 70/30 RELION (70-30) 100 UNIT/ML injection Inject 40-70 Units into the skin 2 (two) times daily with a meal. 40units-AM,  70UNITS-PM    . PARoxetine (PAXIL) 40 MG tablet Take 1 tablet (40 mg total) by mouth daily. 90 tablet 1  . rivaroxaban (XARELTO) 2.5 MG TABS tablet Take 2.5 mg by mouth daily.    . traMADol (ULTRAM) 50 MG tablet     . triamcinolone cream (KENALOG) 0.1 % triamcinolone acetonide 0.1 % topical cream     No current facility-administered medications for this visit.     Medical Decision Making:  Established Problem, Stable/Improving (1), Review of Psycho-Social Stressors (1) and Review of Last Therapy Session (1)  Treatment Plan Summary:Medication management    Paxil 40 mg by mouth daily at bedtime to help with the depression. Continue gabapentin. Follow-up in 2 months or earlier depending on her symptoms   More than 50% of the time spent in psychoeducation, counseling and coordination of care.     This note was generated in part or whole with voice recognition software. Voice regonition is usually quite accurate but there are transcription errors that can and very often do occur. I apologize for any typographical errors that were not detected and corrected.   Rainey Pines, MD

## 2018-02-17 ENCOUNTER — Other Ambulatory Visit: Payer: Self-pay

## 2018-02-17 DIAGNOSIS — E1142 Type 2 diabetes mellitus with diabetic polyneuropathy: Secondary | ICD-10-CM | POA: Diagnosis not present

## 2018-02-17 DIAGNOSIS — S22000A Wedge compression fracture of unspecified thoracic vertebra, initial encounter for closed fracture: Secondary | ICD-10-CM | POA: Diagnosis not present

## 2018-02-17 DIAGNOSIS — E559 Vitamin D deficiency, unspecified: Secondary | ICD-10-CM | POA: Diagnosis not present

## 2018-02-17 DIAGNOSIS — Z780 Asymptomatic menopausal state: Secondary | ICD-10-CM | POA: Diagnosis not present

## 2018-02-17 MED ORDER — ATORVASTATIN CALCIUM 40 MG PO TABS: 40.0000 mg | ORAL_TABLET | Freq: Every day | ORAL | 3 refills | Status: DC

## 2018-02-17 NOTE — Progress Notes (Signed)
Normal loop recorder report from Dr. Caryl Comes (cardiologist).

## 2018-02-17 NOTE — Telephone Encounter (Signed)
Patient advised as directed below. Patient requesting refill on her Atorvastatin to [facility] [place]

## 2018-02-17 NOTE — Telephone Encounter (Signed)
-----   Message from Margo Common, [place] sent at 02/17/2018  8:11 AM EST ----- Normal loop recorder report from Dr. Caryl Comes (cardiologist).

## 2018-02-21 ENCOUNTER — Ambulatory Visit (INDEPENDENT_AMBULATORY_CARE_PROVIDER_SITE_OTHER): Payer: Medicare Other

## 2018-02-21 DIAGNOSIS — Z9119 Patient's noncompliance with other medical treatment and regimen: Secondary | ICD-10-CM | POA: Diagnosis not present

## 2018-02-21 DIAGNOSIS — Z794 Long term (current) use of insulin: Secondary | ICD-10-CM | POA: Diagnosis not present

## 2018-02-21 DIAGNOSIS — E559 Vitamin D deficiency, unspecified: Secondary | ICD-10-CM | POA: Diagnosis not present

## 2018-02-21 DIAGNOSIS — E1129 Type 2 diabetes mellitus with other diabetic kidney complication: Secondary | ICD-10-CM | POA: Diagnosis not present

## 2018-02-21 DIAGNOSIS — F17200 Nicotine dependence, unspecified, uncomplicated: Secondary | ICD-10-CM | POA: Diagnosis not present

## 2018-02-21 DIAGNOSIS — I824Y1 Acute embolism and thrombosis of unspecified deep veins of right proximal lower extremity: Secondary | ICD-10-CM | POA: Diagnosis not present

## 2018-02-21 DIAGNOSIS — E1159 Type 2 diabetes mellitus with other circulatory complications: Secondary | ICD-10-CM | POA: Diagnosis not present

## 2018-02-21 DIAGNOSIS — M810 Age-related osteoporosis without current pathological fracture: Secondary | ICD-10-CM | POA: Diagnosis not present

## 2018-02-21 DIAGNOSIS — E1122 Type 2 diabetes mellitus with diabetic chronic kidney disease: Secondary | ICD-10-CM | POA: Diagnosis not present

## 2018-02-21 DIAGNOSIS — E113393 Type 2 diabetes mellitus with moderate nonproliferative diabetic retinopathy without macular edema, bilateral: Secondary | ICD-10-CM | POA: Diagnosis not present

## 2018-02-21 DIAGNOSIS — E1165 Type 2 diabetes mellitus with hyperglycemia: Secondary | ICD-10-CM | POA: Diagnosis not present

## 2018-02-21 DIAGNOSIS — E1142 Type 2 diabetes mellitus with diabetic polyneuropathy: Secondary | ICD-10-CM | POA: Diagnosis not present

## 2018-02-21 DIAGNOSIS — I639 Cerebral infarction, unspecified: Secondary | ICD-10-CM | POA: Diagnosis not present

## 2018-02-22 ENCOUNTER — Other Ambulatory Visit: Payer: Self-pay | Admitting: Family Medicine

## 2018-02-22 NOTE — Progress Notes (Signed)
Carelink Summary Report / Loop Recorder 

## 2018-02-24 LAB — CUP PACEART REMOTE DEVICE CHECK
Date Time Interrogation Session: 20200115011102
MDC IDC PG IMPLANT DT: 20170627

## 2018-02-28 DIAGNOSIS — Z8673 Personal history of transient ischemic attack (TIA), and cerebral infarction without residual deficits: Secondary | ICD-10-CM | POA: Diagnosis not present

## 2018-03-02 LAB — CUP PACEART REMOTE DEVICE CHECK
Date Time Interrogation Session: 20191213011002
MDC IDC PG IMPLANT DT: 20170627

## 2018-03-10 ENCOUNTER — Telehealth (INDEPENDENT_AMBULATORY_CARE_PROVIDER_SITE_OTHER): Payer: Self-pay

## 2018-03-10 NOTE — Telephone Encounter (Signed)
Patient called and left a message on the triage line and stated that she went to get her Xarelto refilled and the coupon card no longer works. Would like to know if she needs to be switched to another medication that she can afford. Please advise

## 2018-03-10 NOTE — Telephone Encounter (Signed)
[id number] [place], [place] - [address] [place] [address] Phone: [phone] Fax: [phone]  Patient agrees to try Eliquis first but asked for samples of it or Xarelto. Please advise

## 2018-03-10 NOTE — Telephone Encounter (Signed)
We can attempt to switch her to Eliquis however I am unsure if it will be any cheaper.  If it is not, she will have to be placed on Coumadin which requires frequent blood draws for monitoring.

## 2018-03-13 ENCOUNTER — Other Ambulatory Visit (INDEPENDENT_AMBULATORY_CARE_PROVIDER_SITE_OTHER): Payer: Self-pay | Admitting: Nurse Practitioner

## 2018-03-13 DIAGNOSIS — I82411 Acute embolism and thrombosis of right femoral vein: Secondary | ICD-10-CM

## 2018-03-13 MED ORDER — APIXABAN 5 MG PO TABS: 5.0000 mg | ORAL_TABLET | Freq: Two times a day (BID) | ORAL | 0 refills | Status: DC

## 2018-03-13 NOTE — Telephone Encounter (Signed)
Spoke with pt and informed her of Fallon's message. Pt understood and states if she is unable to come her husband will come and pick them up for her. She had no additional questions at this time. Nothing further is needed

## 2018-03-13 NOTE — Telephone Encounter (Signed)
She can pick up some samples of xarelto while she compares the price of eliquis with her pharmacy.  It is a 14 day supply and she can pick it up at check out. If xarelto and eliquis are both too expensive, we will need to discuss coumadin.

## 2018-03-23 ENCOUNTER — Ambulatory Visit: Payer: Medicare Other

## 2018-03-27 ENCOUNTER — Ambulatory Visit (INDEPENDENT_AMBULATORY_CARE_PROVIDER_SITE_OTHER): Payer: Medicare Other

## 2018-03-27 DIAGNOSIS — I639 Cerebral infarction, unspecified: Secondary | ICD-10-CM | POA: Diagnosis not present

## 2018-03-28 LAB — CUP PACEART REMOTE DEVICE CHECK
Date Time Interrogation Session: 20200217010821
Implantable Pulse Generator Implant Date: 20170627

## 2018-04-05 NOTE — Progress Notes (Signed)
Carelink Summary Report / Loop Recorder 

## 2018-04-07 ENCOUNTER — Other Ambulatory Visit: Payer: Self-pay | Admitting: Family Medicine

## 2018-04-07 ENCOUNTER — Telehealth: Payer: Self-pay | Admitting: Family Medicine

## 2018-04-07 NOTE — Telephone Encounter (Signed)
We [facility] requesting that you resend a prescription for Losartan 25mg  because the losartan (COZAAR) 50 MG tablet is on indefinite backorder.

## 2018-04-09 ENCOUNTER — Other Ambulatory Visit (INDEPENDENT_AMBULATORY_CARE_PROVIDER_SITE_OTHER): Payer: Self-pay | Admitting: Vascular Surgery

## 2018-04-09 DIAGNOSIS — I82411 Acute embolism and thrombosis of right femoral vein: Secondary | ICD-10-CM

## 2018-04-10 ENCOUNTER — Other Ambulatory Visit: Payer: Self-pay | Admitting: Family Medicine

## 2018-04-10 ENCOUNTER — Ambulatory Visit (INDEPENDENT_AMBULATORY_CARE_PROVIDER_SITE_OTHER): Payer: Medicare Other

## 2018-04-10 VITALS — BP 118/58 | HR 84 | Temp 97.7°F | Ht 66.0 in | Wt 218.8 lb

## 2018-04-10 DIAGNOSIS — Z0000 Encounter for general adult medical examination without abnormal findings: Secondary | ICD-10-CM

## 2018-04-10 DIAGNOSIS — Z1231 Encounter for screening mammogram for malignant neoplasm of breast: Secondary | ICD-10-CM

## 2018-04-10 MED ORDER — LOSARTAN POTASSIUM 25 MG PO TABS: 50.0000 mg | ORAL_TABLET | Freq: Every day | ORAL | 1 refills | Status: DC

## 2018-04-10 NOTE — Progress Notes (Addendum)
Subjective:   Jocelyn Wells is a 63 y.o. female who presents for Medicare Annual (Subsequent) preventive examination.  Review of Systems:  N/A  Cardiac Risk Factors include: diabetes mellitus;dyslipidemia;hypertension;obesity (BMI >30kg/m2);smoking/ tobacco exposure     Objective:     Vitals: BP (!) 118/58 (BP Location: Right Arm)   Pulse 84   Temp 97.7 F (36.5 C) (Oral)   Ht 5\' 6"  (1.676 m)   Wt 218 lb 12.8 oz (99.2 kg)   BMI 35.32 kg/m   Body mass index is 35.32 kg/m.  Advanced Directives 04/10/2018 12/26/2017 10/05/2017 10/05/2017 03/17/2017 12/08/2016 09/22/2016  Does Patient Have a Medical Advance Directive? No No No No No No No  Does patient want to make changes to medical advance directive? - - - - - - -  Would patient like information on creating a medical advance directive? No - Patient declined No - Patient declined Yes (ED - Information included in AVS) No - Patient declined Yes (MAU/Ambulatory/Procedural Areas - Information given) No - Patient declined -  Some encounter information is confidential and restricted. Go to Review Flowsheets activity to see all data.    Tobacco Social History   Tobacco Use  Smoking Status Current Every Day Smoker  . Packs/day: 1.00  . Years: 35.00  . Pack years: 35.00  . Types: Cigarettes  . Start date: 02/08/1974  Smokeless Tobacco Never Used  Tobacco Comment   "I can not quit smoking, I have tried". 1 PPD     Ready to quit: No Counseling given: No Comment: "I can not quit smoking, I have tried". 1 PPD   Clinical Intake:  Pre-visit preparation completed: Yes  Pain : No/denies pain Pain Score: 0-No pain(Has back pain with movement. )    Diabetes:  Is the patient diabetic?  Yes  If diabetic, was a CBG obtained today?  No  Did the patient bring in their glucometer from home?  No  How often do you monitor your CBG's? Once or twice daily.   Financial Strains and Diabetes Management:  Are you having any financial strains  with the device, your supplies or your medication? No .  Does the patient want to be seen by Chronic Care Management for management of their diabetes?  No  Would the patient like to be referred to a Nutritionist or for Diabetic Management?  No   Diabetic Exams:  Diabetic Eye Exam: Completed 03/14/17. Overdue for diabetic eye exam. Pt has been advised about the importance in completing this exam. Pt has an apt scheduled for next week (unsure of date).  Diabetic Foot Exam: Completed 02/11/18.   Nutritional Status: BMI > 30  Obese Nutritional Risks: None   How often do you need to have someone help you when you read instructions, pamphlets, or other written materials from your doctor or pharmacy?: 1 - Never  Interpreter Needed?: No  Information entered by :: [facility], LPN  Past Medical History:  Diagnosis Date  . Anxiety   . Cardiomyopathy [facility])    new to her Jan 2017  . COPD (chronic obstructive pulmonary disease) ([place])   . Depression   . Diabetes mellitus, type II ([place])   . HTN (hypertension)   . Obesity   . Osteoporosis   . PONV (postoperative nausea and vomiting)   . Stroke [facility])    Jan 2017   Past Surgical History:  Procedure Laterality Date  . ABDOMINAL HYSTERECTOMY    . ANKLE FRACTURE SURGERY Left 2002  . BILATERAL  SALPINGOOPHORECTOMY  2000  . CARDIAC CATHETERIZATION N/A 03/25/2015   Procedure: Right/Left Heart Cath and Coronary Angiography;  Surgeon: Belva Crome, MD;  Location: [place] CV LAB;  Service: Cardiovascular;  Laterality: N/A;  . [place]  . COLONOSCOPY WITH PROPOFOL N/A 09/22/2016   Procedure: COLONOSCOPY WITH PROPOFOL;  Surgeon: Manya Silvas, MD;  Location: [facility] ENDOSCOPY;  Service: Endoscopy;  Laterality: N/A;  . EP IMPLANTABLE DEVICE N/A 08/05/2015   Procedure: Loop Recorder Insertion;  Surgeon: Deboraha Sprang, MD;  Location: [place] CV LAB;  Service: Cardiovascular;  Laterality: N/A;  . ESOPHAGOGASTRODUODENOSCOPY (EGD)  WITH PROPOFOL N/A 09/22/2016   Procedure: ESOPHAGOGASTRODUODENOSCOPY (EGD) WITH PROPOFOL;  Surgeon: Manya Silvas, MD;  Location: [facility] ENDOSCOPY;  Service: Endoscopy;  Laterality: N/A;  . ESOPHAGOGASTRODUODENOSCOPY (EGD) WITH PROPOFOL N/A 12/08/2016   Procedure: ESOPHAGOGASTRODUODENOSCOPY (EGD) WITH PROPOFOL;  Surgeon: Manya Silvas, MD;  Location: [facility] ENDOSCOPY;  Service: Endoscopy;  Laterality: N/A;  . KNEE ARTHROSCOPY Left 2005  . TEE WITHOUT CARDIOVERSION N/A 01/31/2015   Procedure: TRANSESOPHAGEAL ECHOCARDIOGRAM (TEE);  Surgeon: Lelon Perla, MD;  Location: [place];  Service: Cardiovascular;  Laterality: N/A;  . [place]  . ULNAR NERVE TRANSPOSITION  2008   Family History  Problem Relation Age of Onset  . Heart disease Mother        died from CHF  . Asthma Mother   . Diabetes Mother   . Heart disease Father   . Aneurysm Father   . COPD Brother   . Diabetes Brother   . Alcohol abuse Paternal Aunt   . Anemia Neg Hx   . Arrhythmia Neg Hx   . Clotting disorder Neg Hx   . Fainting Neg Hx   . Heart attack Neg Hx   . Heart failure Neg Hx   . Hyperlipidemia Neg Hx   . Hypertension Neg Hx    Social History   Socioeconomic History  . Marital status: Married    Spouse name: Not on file  . Number of children: 2  . Years of education: Not on file  . Highest education level: Bachelor's degree (e.g., BA, AB, BS)  Occupational History  . Occupation: disabled    Comment: retired  Scientific laboratory technician  . Financial resource strain: Not hard at all  . Food insecurity:    Worry: Never true    Inability: Never true  . Transportation needs:    Medical: No    Non-medical: No  Tobacco Use  . Smoking status: Current Every Day Smoker    Packs/day: 1.00    Years: 35.00    Pack years: 35.00    Types: Cigarettes    Start date: 02/08/1974  . Smokeless tobacco: Never Used  . Tobacco comment: "I can not quit smoking, I have tried". 1 PPD  Substance and Sexual Activity   . Alcohol use: No    Alcohol/week: 0.0 standard drinks  . Drug use: No  . Sexual activity: Yes    Birth control/protection: None  Lifestyle  . Physical activity:    Days per week: 0 days    Minutes per session: 0 min  . Stress: Not at all  Relationships  . Social connections:    Talks on phone: Patient refused    Gets together: Patient refused    Attends religious service: Patient refused    Active member of club or organization: Patient refused    Attends meetings of clubs or organizations: Patient refused  Relationship status: Patient refused  Other Topics Concern  . Not on file  Social History Narrative   Lives at home with stepson and husband, dogs and cats   Caffeine  Drinks sweet tea.   Right handed.     Outpatient Encounter Medications as of 04/10/2018  Medication Sig  . atorvastatin (LIPITOR) 40 MG tablet Take 1 tablet (40 mg total) by mouth daily.  . B-D INS SYRINGE 0.5CC/31GX5/16 31G X 5/16" 0.5 ML MISC   . BYDUREON 2 MG PEN Inject 2 mg into the skin once a week. SATURDAY  . carvedilol (COREG) 12.5 MG tablet TAKE 1 TABLET (12.5 MG TOTAL) BY MOUTH 2 (TWO) TIMES DAILY.  Marland Kitchen denosumab (PROLIA) 60 MG/ML SOSY injection Inject 60 mg into the skin every 6 (six) months.  Marland Kitchen ELIQUIS 5 MG TABS tablet TAKE 1 TABLET BY MOUTH TWICE A DAY  . gabapentin (NEURONTIN) 300 MG capsule Take 1 capsule (300 mg total) by mouth daily.  Marland Kitchen glucose blood test strip OneTouch Verio strips  . glyBURIDE-metformin (GLUCOVANCE) 5-500 MG per tablet Take 2 tablets by mouth 2 (two) times daily.   Marland Kitchen loratadine (CLARITIN) 10 MG tablet Take 1 tablet by mouth daily.  Marland Kitchen losartan (COZAAR) 25 MG tablet Take 2 tablets (50 mg total) by mouth daily.  Marland Kitchen NOVOLIN 70/30 RELION (70-30) 100 UNIT/ML injection Inject 10-50 Units into the skin 2 (two) times daily with a meal. 10units-AM,  40-50units-PM  . PARoxetine (PAXIL) 40 MG tablet Take 1 tablet (40 mg total) by mouth daily.  . traMADol (ULTRAM) 50 MG tablet Take 50  mg by mouth every 6 (six) hours as needed.   . Vitamin D, Ergocalciferol, (DRISDOL) 1.25 MG (50000 UT) CAPS capsule Take 50,000 Units by mouth once a week.  . calcipotriene (DOVONOX) 0.005 % cream Apply topically as needed. For psoriasis flares  . rivaroxaban (XARELTO) 2.5 MG TABS tablet Take 2.5 mg by mouth daily.  Marland Kitchen triamcinolone cream (KENALOG) 0.1 % Apply 1 application topically as needed (for psoriasis flares).   . [DISCONTINUED] losartan (COZAAR) 50 MG tablet TAKE 1 TABLET BY MOUTH EVERY DAY   No facility-administered encounter medications on file as of 04/10/2018.     Activities of Daily Living In your present state of health, do you have any difficulty performing the following activities: 04/10/2018 10/05/2017  Hearing? Y N  Comment Occasionally, does not wear hearing aids.  -  Vision? N N  Difficulty concentrating or making decisions? Y N  Walking or climbing stairs? Y Y  Comment Due to back pain.  -  Dressing or bathing? N N  Doing errands, shopping? Y N  Comment Does not drive.  -  Preparing Food and eating ? N -  Using the Toilet? N -  In the past six months, have you accidently leaked urine? Y -  Comment With pressure, does not wear protection.  -  Do you have problems with loss of bowel control? N -  Managing your Medications? N -  Managing your Finances? Y -  Comment Husband manages finances.  -  Housekeeping or managing your Housekeeping? Y -  Comment Has a maid to assist with cleaning.  -  Some recent data might be hidden    Patient Care Team: Chrismon, Vickki Muff, PA as PCP - General (Physician Assistant) Gabriel Carina, Betsey Holiday, MD as Consulting Physician (Endocrinology) Deboraha Sprang, MD as Consulting Physician (Cardiology) Rainey Pines, MD as Referring Physician (Psychiatry) Ralene Bathe, MD as Consulting Physician (Dermatology)  Manya Silvas, MD as Consulting Physician (Gastroenterology) Dimmig, Marcello Moores, MD as Referring Physician (Orthopedic Surgery) Schnier,  Dolores Lory, MD (Vascular Surgery) Lynnell Dike, OD (Optometry) Jannifer Franklin, NP as Nurse Practitioner (Neurology)    Assessment:   This is a routine wellness examination for [facility].  Exercise Activities and Dietary recommendations Current Exercise Habits: The patient does not participate in regular exercise at present, Exercise limited by: orthopedic condition(s)  Goals    . Have 3 meals a day     Recommend eating 3 small meals a day with 2 healthy snack in between to aid in weight loss.     . Increase water intake     Starting 03/11/16, I will increase my water intake to 3 glasses a day.    . Quit Smoking     Recommend to continue efforts to reduce smoking habits until no longer smoking (Smoking Cessation literature attached to AVS).         Fall Risk Fall Risk  04/10/2018 12/26/2017 11/15/2017 03/17/2017 06/16/2016  Falls in the past year? 1 1 Yes Yes Yes  Number falls in past yr: 1 0 2 or more 1 1  Comment - - - tripped over dog -  Injury with Fall? 1 1 Yes Yes Yes  Comment compression fracture to vertebrae compression fracture in back - broken rib leg/knee injury  Risk Factor Category  - - - - -  Risk for fall due to : Impaired balance/gait History of fall(s);Impaired balance/gait - - Other (Comment)  Risk for fall due to: Comment - uses cane when walking - - fell over dog steps at end of her bed  Follow up Falls prevention discussed - - Falls prevention discussed -   FALL RISK PREVENTION PERTAINING TO THE HOME: Any stairs in or around the home? Yes  If so, do they handrails? Yes   Home free of loose throw rugs in walkways, pet beds, electrical cords, etc? Yes  Adequate lighting in your home to reduce risk of falls? Yes   ASSISTIVE DEVICES UTILIZED TO PREVENT FALLS:  Life alert? No  Use of a cane, walker or w/c? Yes  Grab bars in the bathroom? Yes  Shower chair or bench in shower? Yes  Elevated toilet seat or a handicapped toilet? Yes    TIMED UP AND GO:  Was the  test performed? No .    Depression Screen PHQ 2/9 Scores 04/10/2018 12/26/2017 11/15/2017 03/17/2017  PHQ - 2 Score 2 0 0 2  PHQ- 9 Score 6 - - 9     Cognitive Function:      6CIT Screen 04/10/2018 03/17/2017 03/11/2016  What Year? 0 points 0 points 0 points  What month? 0 points 0 points 0 points  What time? 0 points 0 points 0 points  Count back from 20 0 points 0 points 0 points  Months in reverse 0 points 0 points 0 points  Repeat phrase 0 points 0 points 0 points  Total Score 0 0 0    Immunization History  Administered Date(s) Administered  . Influenza Split 01/05/2006, 10/31/2007, 03/03/2009, 12/16/2009, 12/23/2010, 12/24/2011, 12/19/2013  . Influenza,inj,Quad PF,6+ Mos 11/23/2012, 11/07/2015, 11/15/2017  . Influenza-Unspecified 11/07/2015, 11/03/2016  . Pneumococcal Polysaccharide-23 12/23/2010  . Zoster Recombinat (Shingrix) 06/03/2016, 12/11/2016    Qualifies for Shingles Vaccine? Up to date.   Tdap: Although this vaccine is not a covered service during a Wellness Exam, does the patient still wish to receive this vaccine today?  No .  Education has been provided regarding the importance of this vaccine. Advised may receive this vaccine at local pharmacy or Health Dept. Aware to provide a copy of the vaccination record if obtained from local pharmacy or Health Dept. Verbalized acceptance and understanding.  Flu Vaccine: Up to date   Screening Tests Health Maintenance  Topic Date Due  . TETANUS/TDAP  02/27/1973  . MAMMOGRAM  02/28/2004  . OPHTHALMOLOGY EXAM  03/14/2018  . HEMOGLOBIN A1C  08/18/2018  . FOOT EXAM  02/22/2019  . DEXA SCAN  11/18/2019  . COLONOSCOPY  09/22/2021  . INFLUENZA VACCINE  Completed  . Hepatitis C Screening  Completed  . HIV Screening  Completed    Cancer Screenings:  Colorectal Screening: Completed 09/22/16. Repeat every 5 years.  Mammogram: Completed 04/28/11. Repeat every year; Ordered today. Pt provided with contact info and advised to  call to schedule appt.   Lung Cancer Screening: (Low Dose CT Chest recommended if Age 65-80 years, 30 pack-year currently smoking OR have quit w/in 15years.) does qualify however is not due until 04/27/18.    Additional Screening:  Hepatitis C Screening: Up to date  Vision Screening: Recommended annual ophthalmology exams for early detection of glaucoma and other disorders of the eye.  Dental Screening: Recommended annual dental exams for proper oral hygiene  Community Resource Referral:  CRR required this visit?  No  :      Plan:  I have personally reviewed and addressed the Medicare Annual Wellness questionnaire and have noted the following in the patient's chart:  A. Medical and social history B. Use of alcohol, tobacco or illicit drugs  C. Current medications and supplements D. Functional ability and status E.  Nutritional status F.  Physical activity G. Advance directives H. List of other physicians I.  Hospitalizations, surgeries, and ER visits in previous 12 months J.  [place] such as hearing and vision if needed, cognitive and depression L. Referrals and appointments - none  In addition, I have reviewed and discussed with patient certain preventive protocols, quality metrics, and best practice recommendations. A written personalized care plan for preventive services as well as general preventive health recommendations were provided to patient.  See attached scanned questionnaire for additional information.   Signed,  Fabio Neighbors, LPN Nurse Health Advisor   Nurse Recommendations: Eye exam scheduled for next week. Mammogram ordered today. Pt declined tetanus vaccine.  Reviewed note and plan from [place] screening. Was available for consultation. Agree with documentation and recommendations.

## 2018-04-10 NOTE — Telephone Encounter (Signed)
Please advise 

## 2018-04-10 NOTE — Patient Instructions (Signed)
[name] , Thank you for taking time to come for your Medicare Wellness Visit. I appreciate your ongoing commitment to your health goals. Please review the following plan we discussed and let me know if I can assist you in the future.   Screening recommendations/referrals: Colonoscopy: Up to date, due 09/22/2021 Mammogram: Ordered today. Pt provided with contact info and advised to call to schedule appt. Bone Density: Not required until age 64. Recommended yearly ophthalmology/optometry visit for glaucoma screening and checkup Recommended yearly dental visit for hygiene and checkup  Vaccinations: Influenza vaccine: Up to date Pneumococcal vaccine: Not required until age 82.  Tdap vaccine: Pt declines today.  Shingles vaccine: Up to date    Advanced directives: Advance directive discussed with you today. Even though you declined this today please call our office should you change your mind and we can give you the proper paperwork for you to fill out.  Conditions/risks identified: Obesity; Smoking cessation; Diet change.  Next appointment: 04/24/18 @ 1:20 PM with [name]. Pt declined scheduling AWV for 2021 at this time.   Preventive Care 40-64 Years, Female Preventive care refers to lifestyle choices and visits with your health care provider that can promote health and wellness. What does preventive care include?  A yearly physical exam. This is also called an annual well check.  Dental exams once or twice a year.  Routine eye exams. Ask your health care provider how often you should have your eyes checked.  Personal lifestyle choices, including:  Daily care of your teeth and gums.  Regular physical activity.  Eating a healthy diet.  Avoiding tobacco and drug use.  Limiting alcohol use.  Practicing safe sex.  Taking low-dose aspirin daily starting at age 28.  Taking vitamin and mineral supplements as recommended by your health care provider. What happens  during an annual well check? The services and screenings done by your health care provider during your annual well check will depend on your age, overall health, lifestyle risk factors, and family history of disease. Counseling  Your health care provider may ask you questions about your:  Alcohol use.  Tobacco use.  Drug use.  Emotional well-being.  Home and relationship well-being.  Sexual activity.  Eating habits.  Work and work Statistician.  Method of birth control.  Menstrual cycle.  Pregnancy history. Screening  You may have the following tests or measurements:  Height, weight, and BMI.  Blood pressure.  Lipid and cholesterol levels. These may be checked every 5 years, or more frequently if you are over 66 years old.  Skin check.  Lung cancer screening. You may have this screening every year starting at age 86 if you have a 30-pack-year history of smoking and currently smoke or have quit within the past 15 years.  Fecal occult blood test (FOBT) of the stool. You may have this test every year starting at age 3.  Flexible sigmoidoscopy or colonoscopy. You may have a sigmoidoscopy every 5 years or a colonoscopy every 10 years starting at age 12.  Hepatitis C blood test.  Hepatitis B blood test.  Sexually transmitted disease (STD) testing.  Diabetes screening. This is done by checking your blood sugar (glucose) after you have not eaten for a while (fasting). You may have this done every 1-3 years.  Mammogram. This may be done every 1-2 years. Talk to your health care provider about when you should start having regular mammograms. This may depend on whether you have a family history of breast  cancer.  BRCA-related cancer screening. This may be done if you have a family history of breast, ovarian, tubal, or peritoneal cancers.  Pelvic exam and Pap test. This may be done every 3 years starting at age 28. Starting at age 89, this may be done every 5 years if you  have a Pap test in combination with an HPV test.  Bone density scan. This is done to screen for osteoporosis. You may have this scan if you are at high risk for osteoporosis. Discuss your test results, treatment options, and if necessary, the need for more tests with your health care provider. Vaccines  Your health care provider may recommend certain vaccines, such as:  Influenza vaccine. This is recommended every year.  Tetanus, diphtheria, and acellular pertussis (Tdap, Td) vaccine. You may need a Td booster every 10 years.  Zoster vaccine. You may need this after age 53.  Pneumococcal 13-valent conjugate (PCV13) vaccine. You may need this if you have certain conditions and were not previously vaccinated.  Pneumococcal polysaccharide (PPSV23) vaccine. You may need one or two doses if you smoke cigarettes or if you have certain conditions. Talk to your health care provider about which screenings and vaccines you need and how often you need them. This information is not intended to replace advice given to you by your health care provider. Make sure you discuss any questions you have with your health care provider. Document Released: 02/21/2015 Document Revised: 10/15/2015 Document Reviewed: 11/26/2014 Elsevier Interactive Patient Education  2017 [employer] Prevention in the Home Falls can cause injuries. They can happen to people of all ages. There are many things you can do to make your home safe and to help prevent falls. What can I do on the outside of my home?  Regularly fix the edges of walkways and driveways and fix any cracks.  Remove anything that might make you trip as you walk through a door, such as a raised step or threshold.  Trim any bushes or trees on the path to your home.  Use bright outdoor lighting.  Clear any walking paths of anything that might make someone trip, such as rocks or tools.  Regularly check to see if handrails are loose or broken.  Make sure that both sides of any steps have handrails.  Any raised decks and porches should have guardrails on the edges.  Have any leaves, snow, or ice cleared regularly.  Use sand or salt on walking paths during winter.  Clean up any spills in your garage right away. This includes oil or grease spills. What can I do in the bathroom?  Use night lights.  Install grab bars by the toilet and in the tub and shower. Do not use towel bars as grab bars.  Use non-skid mats or decals in the tub or shower.  If you need to sit down in the shower, use a plastic, non-slip stool.  Keep the floor dry. Clean up any water that spills on the floor as soon as it happens.  Remove soap buildup in the tub or shower regularly.  Attach bath mats securely with double-sided non-slip rug tape.  Do not have throw rugs and other things on the floor that can make you trip. What can I do in the bedroom?  Use night lights.  Make sure that you have a light by your bed that is easy to reach.  Do not use any sheets or blankets that are too big for your  bed. They should not hang down onto the floor.  Have a firm chair that has side arms. You can use this for support while you get dressed.  Do not have throw rugs and other things on the floor that can make you trip. What can I do in the kitchen?  Clean up any spills right away.  Avoid walking on wet floors.  Keep items that you use a lot in easy-to-reach places.  If you need to reach something above you, use a strong step stool that has a grab bar.  Keep electrical cords out of the way.  Do not use floor polish or wax that makes floors slippery. If you must use wax, use non-skid floor wax.  Do not have throw rugs and other things on the floor that can make you trip. What can I do with my stairs?  Do not leave any items on the stairs.  Make sure that there are handrails on both sides of the stairs and use them. Fix handrails that are broken or  loose. Make sure that handrails are as long as the stairways.  Check any carpeting to make sure that it is firmly attached to the stairs. Fix any carpet that is loose or worn.  Avoid having throw rugs at the top or bottom of the stairs. If you do have throw rugs, attach them to the floor with carpet tape.  Make sure that you have a light switch at the top of the stairs and the bottom of the stairs. If you do not have them, ask someone to add them for you. What else can I do to help prevent falls?  Wear shoes that:  Do not have high heels.  Have rubber bottoms.  Are comfortable and fit you well.  Are closed at the toe. Do not wear sandals.  If you use a stepladder:  Make sure that it is fully opened. Do not climb a closed stepladder.  Make sure that both sides of the stepladder are locked into place.  Ask someone to hold it for you, if possible.  Clearly mark and make sure that you can see:  Any grab bars or handrails.  First and last steps.  Where the edge of each step is.  Use tools that help you move around (mobility aids) if they are needed. These include:  Canes.  Walkers.  Scooters.  Crutches.  Turn on the lights when you go into a dark area. Replace any light bulbs as soon as they burn out.  Set up your furniture so you have a clear path. Avoid moving your furniture around.  If any of your floors are uneven, fix them.  If there are any pets around you, be aware of where they are.  Review your medicines with your doctor. Some medicines can make you feel dizzy. This can increase your chance of falling. Ask your doctor what other things that you can do to help prevent falls. This information is not intended to replace advice given to you by your health care provider. Make sure you discuss any questions you have with your health care provider. Document Released: 11/21/2008 Document Revised: 07/03/2015 Document Reviewed: 03/01/2014 Elsevier Interactive  Patient Education  2017 [employer].

## 2018-04-10 NOTE — Telephone Encounter (Signed)
Done

## 2018-04-10 NOTE — Progress Notes (Signed)
Pharmacy requested change of Losartan 50 mg qd to 25 mg 2 tablets daily because the 50 mg tablet is not available.

## 2018-04-13 ENCOUNTER — Encounter: Payer: Self-pay | Admitting: *Deleted

## 2018-04-14 ENCOUNTER — Telehealth: Payer: Self-pay | Admitting: *Deleted

## 2018-04-14 ENCOUNTER — Telehealth: Payer: Self-pay

## 2018-04-14 ENCOUNTER — Encounter: Payer: Self-pay | Admitting: *Deleted

## 2018-04-14 DIAGNOSIS — E1159 Type 2 diabetes mellitus with other circulatory complications: Secondary | ICD-10-CM | POA: Diagnosis not present

## 2018-04-14 DIAGNOSIS — Z794 Long term (current) use of insulin: Secondary | ICD-10-CM | POA: Diagnosis not present

## 2018-04-14 DIAGNOSIS — R809 Proteinuria, unspecified: Secondary | ICD-10-CM | POA: Diagnosis not present

## 2018-04-14 DIAGNOSIS — F17200 Nicotine dependence, unspecified, uncomplicated: Secondary | ICD-10-CM | POA: Diagnosis not present

## 2018-04-14 DIAGNOSIS — E559 Vitamin D deficiency, unspecified: Secondary | ICD-10-CM | POA: Insufficient documentation

## 2018-04-14 DIAGNOSIS — E1129 Type 2 diabetes mellitus with other diabetic kidney complication: Secondary | ICD-10-CM | POA: Diagnosis not present

## 2018-04-14 DIAGNOSIS — Z122 Encounter for screening for malignant neoplasm of respiratory organs: Secondary | ICD-10-CM

## 2018-04-14 DIAGNOSIS — E113393 Type 2 diabetes mellitus with moderate nonproliferative diabetic retinopathy without macular edema, bilateral: Secondary | ICD-10-CM | POA: Diagnosis not present

## 2018-04-14 DIAGNOSIS — M810 Age-related osteoporosis without current pathological fracture: Secondary | ICD-10-CM | POA: Insufficient documentation

## 2018-04-14 DIAGNOSIS — I10 Essential (primary) hypertension: Secondary | ICD-10-CM | POA: Diagnosis not present

## 2018-04-14 DIAGNOSIS — E1122 Type 2 diabetes mellitus with diabetic chronic kidney disease: Secondary | ICD-10-CM | POA: Diagnosis not present

## 2018-04-14 DIAGNOSIS — E1165 Type 2 diabetes mellitus with hyperglycemia: Secondary | ICD-10-CM | POA: Diagnosis not present

## 2018-04-14 DIAGNOSIS — E1142 Type 2 diabetes mellitus with diabetic polyneuropathy: Secondary | ICD-10-CM | POA: Diagnosis not present

## 2018-04-14 NOTE — Telephone Encounter (Signed)
Patient has been notified that the annual lung cancer screening low dose CT scan is due currently or will be in the near future.  Confirmed that the patient is within the age range of 46-80, and asymptomatic, and currently exhibits no signs or symptoms of lung cancer.  Patient denies illness that would prevent curative treatment for lung cancer if found.  Verified smoking history, current smoker 1 ppd with 36pkyr hx .  The shared decision making visit was completed on 04-26-17.  Patient is agreeable for the CT scan to be scheduled.  Will call patient back with date and time of appointment.

## 2018-04-14 NOTE — Telephone Encounter (Signed)
Call pt regarding lung screening. Pt is a current smoker, smoking about 1 pack per day. Pt would like scan any day in the afternoon. Pt has a blood clot in right leg and is on blood thinner at this time.

## 2018-04-19 ENCOUNTER — Telehealth: Payer: Self-pay | Admitting: *Deleted

## 2018-04-19 DIAGNOSIS — E113393 Type 2 diabetes mellitus with moderate nonproliferative diabetic retinopathy without macular edema, bilateral: Secondary | ICD-10-CM | POA: Diagnosis not present

## 2018-04-19 DIAGNOSIS — H40053 Ocular hypertension, bilateral: Secondary | ICD-10-CM | POA: Diagnosis not present

## 2018-04-19 NOTE — Telephone Encounter (Signed)
Called pt to inform her of her appt for ldct screening on Friday 04/28/18 @ 1:45pm here @ [facility], voiced understanding.

## 2018-04-24 ENCOUNTER — Encounter: Payer: Self-pay | Admitting: Family Medicine

## 2018-04-24 NOTE — Progress Notes (Deleted)
Patient: Jocelyn Wells, Female    DOB: 07/12/1955, 63 y.o.   MRN: [id number] Visit Date: 04/24/2018  Today's Provider: Vernie Murders, PA   No chief complaint on file.  Subjective:     Annual physical exam Jocelyn Wells is a 63 y.o. female who presents today for health maintenance and complete physical. She feels {DESC; WELL/FAIRLY WELL/POORLY:18703}. She reports exercising ***. She reports she is sleeping {DESC; WELL/FAIRLY WELL/POORLY:18703}.  -----------------------------------------------------------------   Review of Systems  Social History      She  reports that she has been smoking cigarettes. She started smoking about 44 years ago. She has a 36.00 pack-year smoking history. She has never used smokeless tobacco. She reports that she does not drink alcohol or use drugs.       Social History   Socioeconomic History  . Marital status: Married    Spouse name: Not on file  . Number of children: 2  . Years of education: Not on file  . Highest education level: Bachelor's degree (e.g., BA, AB, BS)  Occupational History  . Occupation: disabled    Comment: retired  Scientific laboratory technician  . Financial resource strain: Not hard at all  . Food insecurity:    Worry: Never true    Inability: Never true  . Transportation needs:    Medical: No    Non-medical: No  Tobacco Use  . Smoking status: Current Every Day Smoker    Packs/day: 1.00    Years: 36.00    Pack years: 36.00    Types: Cigarettes    Start date: 02/08/1974  . Smokeless tobacco: Never Used  . Tobacco comment: "I can not quit smoking, I have tried". 1 PPD  Substance and Sexual Activity  . Alcohol use: No    Alcohol/week: 0.0 standard drinks  . Drug use: No  . Sexual activity: Yes    Birth control/protection: None  Lifestyle  . Physical activity:    Days per week: 0 days    Minutes per session: 0 min  . Stress: Not at all  Relationships  . Social connections:    Talks on phone: Patient refused    Gets  together: Patient refused    Attends religious service: Patient refused    Active member of club or organization: Patient refused    Attends meetings of clubs or organizations: Patient refused    Relationship status: Patient refused  Other Topics Concern  . Not on file  Social History Narrative   Lives at home with stepson and husband, dogs and cats   Caffeine  Drinks sweet tea.   Right handed.     Past Medical History:  Diagnosis Date  . Anxiety   . Cardiomyopathy [facility])    new to her Jan 2017  . COPD (chronic obstructive pulmonary disease) ([place])   . Depression   . Diabetes mellitus, type II ([place])   . HTN (hypertension)   . Obesity   . Osteoporosis   . PONV (postoperative nausea and vomiting)   . Stroke [facility])    Jan 2017     Patient Active Problem List   Diagnosis Date Noted  . DVT (deep venous thrombosis) ([place]) 02/09/2018  . CVA (cerebral vascular accident) ([place]) 10/05/2017  . Depression (emotion) 06/12/2015  . Cerebrovascular accident (CVA) due to bilateral thrombosis of carotid arteries ([employer]) 06/12/2015  . Excessive daytime sleepiness 06/12/2015  . Nicotine dependence 04/24/2015  . Snoring 04/24/2015  . Sleep paralysis, recurrent isolated 04/24/2015  .  Hypersomnia with sleep apnea 04/24/2015  . Cataplexy 04/24/2015  . Morbid obesity due to excess calories ([place]) 04/24/2015  . COPD exacerbation ([place]) 04/24/2015  . Cerebrovascular accident (CVA) due to thrombosis of left middle cerebral artery ([place]) 04/24/2015  . Embolic stroke ([place]) 50/04/7046  . Fatigue 04/23/2015  . Type 2 diabetes mellitus ([place]) 04/07/2015  . Chronic systolic heart failure ([place]) 03/25/2015  . Stroke ([place]) 01/31/2015  . Nausea & vomiting 01/29/2015  . AKI (acute kidney injury) ([place]) 01/29/2015  . CVA (cerebral infarction) 01/29/2015  . Stroke (cerebrum) ([place]) 01/29/2015  . Dizziness and giddiness 07/05/2014  . Blood pressure elevated 07/05/2014  . Carbuncle and furuncle 07/05/2014  . BP  (high blood pressure) 07/05/2014  . Does not feel right 07/05/2014  . Pain of perianal area 07/05/2014  . Guttate psoriasis 07/05/2014  . Gonalgia 07/05/2014  . H/O diabetes mellitus 06/05/2014  . CAFL (chronic airflow limitation) ([place]) 06/05/2014  . H/O visual disturbance 06/05/2014  . H/O elevated lipids 06/05/2014  . H/O: HTN (hypertension) 06/05/2014  . H/O: osteoarthritis 06/05/2014  . Initial insomnia 06/05/2014  . Anxiety, generalized 06/05/2014  . Depression, major, recurrent, moderate ([place]) 06/05/2014  . Microalbuminuria 12/23/2013  . Long term current use of insulin ([place]) 12/23/2013  . Compulsive tobacco user syndrome 12/23/2013  . Type 2 diabetes mellitus with other diabetic kidney complication ([place]) 88/91/6945  . Type II diabetes mellitus with renal manifestations, uncontrolled ([place]) 12/23/2013  . Contusion of cheek 03/31/2009  . Change in blood platelet count 06/08/2007  . Blood in feces 06/01/2007  . D (diarrhea) 06/01/2007  . Disturbance of skin sensation 09/02/2006  . Difficulty hearing 07/25/2006  . Acute onset aura migraine 06/27/2006  . Cephalalgia 05/27/2006  . Clinical depression 10/22/2005  . Diabetes mellitus type 2, uncontrolled ([place]) 10/22/2005  . HLD (hyperlipidemia) 10/22/2005  . Arthritis, degenerative 10/22/2005  . Current tobacco use 10/22/2005    Past Surgical History:  Procedure Laterality Date  . ABDOMINAL HYSTERECTOMY    . ANKLE FRACTURE SURGERY Left 2002  . BILATERAL SALPINGOOPHORECTOMY  2000  . CARDIAC CATHETERIZATION N/A 03/25/2015   Procedure: Right/Left Heart Cath and Coronary Angiography;  Surgeon: Belva Crome, MD;  Location: [facility];  Service: Cardiovascular;  Laterality: N/A;  . [place]  . COLONOSCOPY WITH PROPOFOL N/A 09/22/2016   Procedure: COLONOSCOPY WITH PROPOFOL;  Surgeon: Manya Silvas, MD;  Location[facility] ENDOSCOPY;  Service: Endoscopy;  Laterality: N/A;  . EP IMPLANTABLE DEVICE N/A 08/05/2015    Procedure: Loop Recorder Insertion;  Surgeon: Deboraha Sprang, MD;  Location: [place];  Service: Cardiovascular;  Laterality: N/A;  . ESOPHAGOGASTRODUODENOSCOPY (EGD) WITH PROPOFOL N/A 09/22/2016   Procedure: ESOPHAGOGASTRODUODENOSCOPY (EGD) WITH PROPOFOL;  Surgeon: Manya Silvas, MD;  Location[facility] ENDOSCOPY;  Service: Endoscopy;  Laterality: N/A;  . ESOPHAGOGASTRODUODENOSCOPY (EGD) WITH PROPOFOL N/A 12/08/2016   Procedure: ESOPHAGOGASTRODUODENOSCOPY (EGD) WITH PROPOFOL;  Surgeon: Manya Silvas, MD;  Location: [facility] ENDOSCOPY;  Service: Endoscopy;  Laterality: N/A;  . KNEE ARTHROSCOPY Left 2005  . TEE WITHOUT CARDIOVERSION N/A 01/31/2015   Procedure: TRANSESOPHAGEAL ECHOCARDIOGRAM (TEE);  Surgeon: Lelon Perla, MD;  Location: [place];  Service: Cardiovascular;  Laterality: N/A;  . [place]  . ULNAR NERVE TRANSPOSITION  2008    Family History        Family Status  Relation Name Status  . Mother  Deceased  . Father  Deceased  . Brother 1 Alive  . MGF  Deceased  . Brother 2 Deceased  . Ethlyn Daniels  (Not Specified)  . Neg Hx  (Not Specified)        Her family history includes Alcohol abuse in her paternal aunt; Aneurysm in her father; Asthma in her mother; COPD in her brother; Diabetes in her brother and mother; Heart disease in her father and mother. There is no history of Anemia, Arrhythmia, Clotting disorder, Fainting, Heart attack, Heart failure, Hyperlipidemia, or Hypertension.      Allergies  Allergen Reactions  . Codeine Nausea And Vomiting     Current Outpatient Medications:  .  atorvastatin (LIPITOR) 40 MG tablet, Take 1 tablet (40 mg total) by mouth daily., Disp: 90 tablet, Rfl: 3 .  B-D INS SYRINGE 0.5CC/31GX5/16 31G X 5/16" 0.5 ML MISC, , Disp: , Rfl:  .  BYDUREON 2 MG PEN, Inject 2 mg into the skin once a week. SATURDAY, Disp: , Rfl:  .  calcipotriene (DOVONOX) 0.005 % cream, Apply topically as needed. For psoriasis flares, Disp: ,  Rfl:  .  carvedilol (COREG) 12.5 MG tablet, TAKE 1 TABLET (12.5 MG TOTAL) BY MOUTH 2 (TWO) TIMES DAILY., Disp: 60 tablet, Rfl: 11 .  denosumab (PROLIA) 60 MG/ML SOSY injection, Inject 60 mg into the skin every 6 (six) months., Disp: , Rfl:  .  ELIQUIS 5 MG TABS tablet, TAKE 1 TABLET BY MOUTH TWICE A DAY, Disp: 60 tablet, Rfl: 0 .  gabapentin (NEURONTIN) 300 MG capsule, Take 1 capsule (300 mg total) by mouth daily., Disp: 30 capsule, Rfl: 11 .  glucose blood test strip, OneTouch Verio strips, Disp: , Rfl:  .  glyBURIDE-metformin (GLUCOVANCE) 5-500 MG per tablet, Take 2 tablets by mouth 2 (two) times daily. , Disp: , Rfl:  .  loratadine (CLARITIN) 10 MG tablet, Take 1 tablet by mouth daily., Disp: , Rfl:  .  losartan (COZAAR) 25 MG tablet, Take 2 tablets (50 mg total) by mouth daily., Disp: 180 tablet, Rfl: 1 .  NOVOLIN 70/30 RELION (70-30) 100 UNIT/ML injection, Inject 10-50 Units into the skin 2 (two) times daily with a meal. 10units-AM,  40-50units-PM, Disp: , Rfl:  .  PARoxetine (PAXIL) 40 MG tablet, Take 1 tablet (40 mg total) by mouth daily., Disp: 90 tablet, Rfl: 1 .  rivaroxaban (XARELTO) 2.5 MG TABS tablet, Take 2.5 mg by mouth daily., Disp: , Rfl:  .  traMADol (ULTRAM) 50 MG tablet, Take 50 mg by mouth every 6 (six) hours as needed. , Disp: , Rfl:  .  triamcinolone cream (KENALOG) 0.1 %, Apply 1 application topically as needed (for psoriasis flares). , Disp: , Rfl:  .  Vitamin D, Ergocalciferol, (DRISDOL) 1.25 MG (50000 UT) CAPS capsule, Take 50,000 Units by mouth once a week., Disp: , Rfl:    Patient Care Team: Chrismon, Vickki Muff, PA as PCP - General (Physician Assistant) Gabriel Carina, Betsey Holiday, MD as Physician Assistant (Endocrinology) Deboraha Sprang, MD as Consulting Physician (Cardiology) Rainey Pines, MD as Referring Physician (Psychiatry) Ralene Bathe, MD as Consulting Physician (Dermatology) Manya Silvas, MD as Consulting Physician (Gastroenterology) Dimmig, Marcello Moores, MD as  Referring Physician (Orthopedic Surgery) Schnier, Dolores Lory, MD (Vascular Surgery) Lynnell Dike, OD (Optometry) Jannifer Franklin, NP as Nurse Practitioner (Neurology)    Objective:    Vitals: There were no vitals taken for this visit.  There were no vitals filed for this visit.   Physical Exam   Depression Screen PHQ 2/9 Scores 04/10/2018 12/26/2017 11/15/2017 03/17/2017  PHQ -  2 Score 2 0 0 2  PHQ- 9 Score 6 - - 9       Assessment & Plan:     Routine Health Maintenance and Physical Exam  Exercise Activities and Dietary recommendations Goals    . Have 3 meals a day     Recommend eating 3 small meals a day with 2 healthy snack in between to aid in weight loss.     . Increase water intake     Starting 03/11/16, I will increase my water intake to 3 glasses a day.    . Quit Smoking     Recommend to continue efforts to reduce smoking habits until no longer smoking (Smoking Cessation literature attached to AVS).         Immunization History  Administered Date(s) Administered  . Influenza Split 01/05/2006, 10/31/2007, 03/03/2009, 12/16/2009, 12/23/2010, 12/24/2011, 12/19/2013  . Influenza,inj,Quad PF,6+ Mos 11/23/2012, 11/07/2015, 11/15/2017  . Influenza-Unspecified 11/07/2015, 11/03/2016  . Pneumococcal Polysaccharide-23 12/23/2010  . Zoster Recombinat (Shingrix) 06/03/2016, 12/11/2016    Health Maintenance  Topic Date Due  . TETANUS/TDAP  02/27/1973  . MAMMOGRAM  02/28/2004  . OPHTHALMOLOGY EXAM  03/14/2018  . HEMOGLOBIN A1C  08/18/2018  . FOOT EXAM  02/22/2019  . DEXA SCAN  11/18/2019  . COLONOSCOPY  09/22/2021  . INFLUENZA VACCINE  Completed  . Hepatitis C Screening  Completed  . HIV Screening  Completed     Discussed health benefits of physical activity, and encouraged her to engage in regular exercise appropriate for her age and condition.    --------------------------------------------------------------------    Vernie Murders, [place]

## 2018-04-28 ENCOUNTER — Ambulatory Visit (INDEPENDENT_AMBULATORY_CARE_PROVIDER_SITE_OTHER): Payer: Medicare Other | Admitting: *Deleted

## 2018-04-28 ENCOUNTER — Ambulatory Visit: Admission: RE | Admit: 2018-04-28 | Payer: Medicare Other | Source: Ambulatory Visit

## 2018-04-28 ENCOUNTER — Other Ambulatory Visit: Payer: Self-pay

## 2018-04-28 DIAGNOSIS — I639 Cerebral infarction, unspecified: Secondary | ICD-10-CM | POA: Diagnosis not present

## 2018-04-29 LAB — CUP PACEART REMOTE DEVICE CHECK
Date Time Interrogation Session: 20200321020748
Implantable Pulse Generator Implant Date: 20170627

## 2018-05-05 ENCOUNTER — Other Ambulatory Visit (INDEPENDENT_AMBULATORY_CARE_PROVIDER_SITE_OTHER): Payer: Self-pay | Admitting: Vascular Surgery

## 2018-05-05 DIAGNOSIS — I82411 Acute embolism and thrombosis of right femoral vein: Secondary | ICD-10-CM

## 2018-05-05 NOTE — Progress Notes (Signed)
Carelink Summary Report / Loop Recorder 

## 2018-05-15 ENCOUNTER — Other Ambulatory Visit: Payer: Self-pay

## 2018-05-15 ENCOUNTER — Encounter: Payer: Self-pay | Admitting: Psychiatry

## 2018-05-15 ENCOUNTER — Ambulatory Visit (INDEPENDENT_AMBULATORY_CARE_PROVIDER_SITE_OTHER): Payer: Medicare Other | Admitting: Psychiatry

## 2018-05-15 DIAGNOSIS — F0630 Mood disorder due to known physiological condition, unspecified: Secondary | ICD-10-CM | POA: Diagnosis not present

## 2018-05-15 DIAGNOSIS — I639 Cerebral infarction, unspecified: Secondary | ICD-10-CM

## 2018-05-15 MED ORDER — GABAPENTIN 300 MG PO CAPS: 300.0000 mg | ORAL_CAPSULE | Freq: Every day | ORAL | 3 refills | Status: DC

## 2018-05-15 MED ORDER — PAROXETINE HCL 40 MG PO TABS: 40.0000 mg | ORAL_TABLET | Freq: Every day | ORAL | 1 refills | Status: DC

## 2018-05-15 NOTE — Progress Notes (Cosign Needed)
Tc on  05-15-18 @ 1:089 viewed and updated the patient medications,no changes with patient medical and surgery hx or allergies. Pt vitals were not done due to this was a phone consult.

## 2018-05-15 NOTE — Progress Notes (Signed)
Patient is a 63 year old female with history of mood  disorderwho was followed up for her medications. She reported that she has been doing well and staying at home. She reported that she is trying to keep to herself. Patient reported that she has been she has been staying indoors and trying to stabilize her diabetes.she has no other acute issues. Her family remain supportive.  Plan. Refill her medications.. Patient will follow up in three months or earlier.  I have discussed the assessment and treatment plan with the patient. The patient was provided an opportunity to ask questions and all were answered. The patient agreed with the plan and demonstrated an understanding of the instructions.   The patient was advised to call back or seek an in-person evaluation if the symptoms worsen or if the condition fails to improve as anticipated.   I provided 10 minutes of non-face-to-face time during this encounter.

## 2018-05-22 ENCOUNTER — Telehealth: Payer: Self-pay | Admitting: *Deleted

## 2018-05-22 NOTE — Telephone Encounter (Signed)
Patient notified/message left to notify patient that due to current restrictions lung screening appointments are cancelled and patient will be contacted regarding rescheduling.  

## 2018-05-29 ENCOUNTER — Ambulatory Visit: Payer: Medicare Other

## 2018-05-31 ENCOUNTER — Ambulatory Visit (INDEPENDENT_AMBULATORY_CARE_PROVIDER_SITE_OTHER): Payer: Medicare Other | Admitting: *Deleted

## 2018-05-31 ENCOUNTER — Other Ambulatory Visit: Payer: Self-pay

## 2018-05-31 DIAGNOSIS — I639 Cerebral infarction, unspecified: Secondary | ICD-10-CM | POA: Diagnosis not present

## 2018-06-01 LAB — CUP PACEART REMOTE DEVICE CHECK
Date Time Interrogation Session: 20200423023717
Implantable Pulse Generator Implant Date: 20170627

## 2018-06-08 ENCOUNTER — Other Ambulatory Visit (INDEPENDENT_AMBULATORY_CARE_PROVIDER_SITE_OTHER): Payer: Self-pay | Admitting: Nurse Practitioner

## 2018-06-08 DIAGNOSIS — I82411 Acute embolism and thrombosis of right femoral vein: Secondary | ICD-10-CM

## 2018-06-08 NOTE — Progress Notes (Signed)
Carelink Summary Report / Loop Recorder 

## 2018-07-04 ENCOUNTER — Ambulatory Visit (INDEPENDENT_AMBULATORY_CARE_PROVIDER_SITE_OTHER): Payer: Medicare Other | Admitting: *Deleted

## 2018-07-04 DIAGNOSIS — I639 Cerebral infarction, unspecified: Secondary | ICD-10-CM

## 2018-07-04 DIAGNOSIS — I428 Other cardiomyopathies: Secondary | ICD-10-CM

## 2018-07-05 LAB — CUP PACEART REMOTE DEVICE CHECK
Date Time Interrogation Session: 20200526023713
Implantable Pulse Generator Implant Date: 20170627

## 2018-07-11 ENCOUNTER — Telehealth: Payer: Self-pay | Admitting: *Deleted

## 2018-07-11 NOTE — Telephone Encounter (Signed)
Left message for patient to notify them that it is time to schedule annual low dose lung cancer screening CT scan. Instructed patient to call back to verify information prior to the scan being scheduled.  

## 2018-07-14 ENCOUNTER — Other Ambulatory Visit (INDEPENDENT_AMBULATORY_CARE_PROVIDER_SITE_OTHER): Payer: Self-pay | Admitting: Nurse Practitioner

## 2018-07-14 DIAGNOSIS — I82411 Acute embolism and thrombosis of right femoral vein: Secondary | ICD-10-CM

## 2018-07-17 NOTE — Progress Notes (Signed)
Carelink Summary Report / Loop Recorder 

## 2018-07-28 ENCOUNTER — Telehealth: Payer: Self-pay

## 2018-07-28 NOTE — Telephone Encounter (Signed)
Call pt regarding lung screening. Left message for pt to return call.  

## 2018-08-06 LAB — CUP PACEART REMOTE DEVICE CHECK
Date Time Interrogation Session: 20200628034010
Implantable Pulse Generator Implant Date: 20170627

## 2018-08-07 ENCOUNTER — Ambulatory Visit (INDEPENDENT_AMBULATORY_CARE_PROVIDER_SITE_OTHER): Payer: Medicare Other | Admitting: Vascular Surgery

## 2018-08-07 ENCOUNTER — Ambulatory Visit (INDEPENDENT_AMBULATORY_CARE_PROVIDER_SITE_OTHER): Payer: Medicare Other | Admitting: *Deleted

## 2018-08-07 ENCOUNTER — Ambulatory Visit (INDEPENDENT_AMBULATORY_CARE_PROVIDER_SITE_OTHER): Payer: Medicare Other

## 2018-08-07 ENCOUNTER — Other Ambulatory Visit: Payer: Self-pay

## 2018-08-07 ENCOUNTER — Encounter (INDEPENDENT_AMBULATORY_CARE_PROVIDER_SITE_OTHER): Payer: Self-pay | Admitting: Vascular Surgery

## 2018-08-07 VITALS — BP 132/79 | HR 76 | Resp 16 | Wt 219.0 lb

## 2018-08-07 DIAGNOSIS — I82411 Acute embolism and thrombosis of right femoral vein: Secondary | ICD-10-CM | POA: Diagnosis not present

## 2018-08-07 DIAGNOSIS — Z7901 Long term (current) use of anticoagulants: Secondary | ICD-10-CM | POA: Diagnosis not present

## 2018-08-07 DIAGNOSIS — I10 Essential (primary) hypertension: Secondary | ICD-10-CM | POA: Diagnosis not present

## 2018-08-07 DIAGNOSIS — I63429 Cerebral infarction due to embolism of unspecified anterior cerebral artery: Secondary | ICD-10-CM | POA: Diagnosis not present

## 2018-08-07 DIAGNOSIS — Z79899 Other long term (current) drug therapy: Secondary | ICD-10-CM | POA: Diagnosis not present

## 2018-08-07 DIAGNOSIS — I639 Cerebral infarction, unspecified: Secondary | ICD-10-CM

## 2018-08-07 DIAGNOSIS — E113319 Type 2 diabetes mellitus with moderate nonproliferative diabetic retinopathy with macular edema, unspecified eye: Secondary | ICD-10-CM

## 2018-08-07 DIAGNOSIS — F17210 Nicotine dependence, cigarettes, uncomplicated: Secondary | ICD-10-CM

## 2018-08-07 DIAGNOSIS — Z794 Long term (current) use of insulin: Secondary | ICD-10-CM | POA: Diagnosis not present

## 2018-08-07 DIAGNOSIS — I5022 Chronic systolic (congestive) heart failure: Secondary | ICD-10-CM

## 2018-08-07 NOTE — Progress Notes (Signed)
MRN : [id number]  Jocelyn Wells is a 63 y.o. (1955/03/21) female who presents with chief complaint of No chief complaint on file. Marland Kitchen  History of Present Illness:   The patient presents to the office for evaluation of DVT.  DVT was identified at [facility] by Duplex ultrasound.  The initial symptoms were pain and swelling in the lower extremity.  She was initiated on anticoagulation at that time.  She is continued her oral anticoagulation without interruption since December and has now completed 6 months without complication.  The patient notes the leg is much less painful with dependency but still swells quite a bite.  Symptoms are much better with elevation.  The patient notes minimal edema in the morning which steadily worsens throughout the day.    She was evaluated at [facility] on 01/27/2018 and found to need a T12 and L1 kyphoplasty but this has not yet been scheduled  The patient has not been using compression therapy at this point.  No SOB or pleuritic chest pains.  No cough or hemoptysis.  No blood per rectum or blood in any sputum.  No excessive bruising per the patient.   No outpatient medications have been marked as taking for the 08/07/18 encounter (Appointment) with Delana Meyer, Dolores Lory, MD.    Past Medical History:  Diagnosis Date  . Anxiety   . Cardiomyopathy [facility])    new to her Jan 2017  . COPD (chronic obstructive pulmonary disease) ([place])   . Depression   . Diabetes mellitus, type II ([place])   . HTN (hypertension)   . Obesity   . Osteoporosis   . PONV (postoperative nausea and vomiting)   . Stroke [facility])    Jan 2017    Past Surgical History:  Procedure Laterality Date  . ABDOMINAL HYSTERECTOMY    . ANKLE FRACTURE SURGERY Left 2002  . BILATERAL SALPINGOOPHORECTOMY  2000  . CARDIAC CATHETERIZATION N/A 03/25/2015   Procedure: Right/Left Heart Cath and Coronary Angiography;  Surgeon: Belva Crome, MD;  Location: [place] CV LAB;  Service:  Cardiovascular;  Laterality: N/A;  . [place]  . COLONOSCOPY WITH PROPOFOL N/A 09/22/2016   Procedure: COLONOSCOPY WITH PROPOFOL;  Surgeon: Manya Silvas, MD;  Location: [facility] ENDOSCOPY;  Service: Endoscopy;  Laterality: N/A;  . EP IMPLANTABLE DEVICE N/A 08/05/2015   Procedure: Loop Recorder Insertion;  Surgeon: Deboraha Sprang, MD;  Location: [place] CV LAB;  Service: Cardiovascular;  Laterality: N/A;  . ESOPHAGOGASTRODUODENOSCOPY (EGD) WITH PROPOFOL N/A 09/22/2016   Procedure: ESOPHAGOGASTRODUODENOSCOPY (EGD) WITH PROPOFOL;  Surgeon: Manya Silvas, MD;  Location: [facility] ENDOSCOPY;  Service: Endoscopy;  Laterality: N/A;  . ESOPHAGOGASTRODUODENOSCOPY (EGD) WITH PROPOFOL N/A 12/08/2016   Procedure: ESOPHAGOGASTRODUODENOSCOPY (EGD) WITH PROPOFOL;  Surgeon: Manya Silvas, MD;  Location: [facility] ENDOSCOPY;  Service: Endoscopy;  Laterality: N/A;  . KNEE ARTHROSCOPY Left 2005  . TEE WITHOUT CARDIOVERSION N/A 01/31/2015   Procedure: TRANSESOPHAGEAL ECHOCARDIOGRAM (TEE);  Surgeon: Lelon Perla, MD;  Location: [place];  Service: Cardiovascular;  Laterality: N/A;  . [place]  . ULNAR NERVE TRANSPOSITION  2008    Social History Social History   Tobacco Use  . Smoking status: Current Every Day Smoker    Packs/day: 1.00    Years: 36.00    Pack years: 36.00    Types: Cigarettes    Start date: 02/08/1974  . Smokeless tobacco: Never Used  . Tobacco comment: "I can not quit smoking, I  have tried". 1 PPD  Substance Use Topics  . Alcohol use: No    Alcohol/week: 0.0 standard drinks  . Drug use: No    Family History Family History  Problem Relation Age of Onset  . Heart disease Mother        died from CHF  . Asthma Mother   . Diabetes Mother   . Heart disease Father   . Aneurysm Father   . COPD Brother   . Diabetes Brother   . Alcohol abuse Paternal Aunt   . Anemia Neg Hx   . Arrhythmia Neg Hx   . Clotting disorder Neg Hx   . Fainting Neg Hx   .  Heart attack Neg Hx   . Heart failure Neg Hx   . Hyperlipidemia Neg Hx   . Hypertension Neg Hx     Allergies  Allergen Reactions  . Codeine Nausea And Vomiting     REVIEW OF SYSTEMS (Negative unless checked)  Constitutional: [] Weight loss  [] Fever  [] Chills Cardiac: [] Chest pain   [] Chest pressure   [] Palpitations   [] Shortness of breath when laying flat   [] Shortness of breath with exertion. Vascular:  [] Pain in legs with walking   [] Pain in legs at rest  [] History of DVT   [] Phlebitis   [] Swelling in legs   [] Varicose veins   [] Non-healing ulcers Pulmonary:   [] Uses home oxygen   [] Productive cough   [] Hemoptysis   [] Wheeze  [] COPD   [] Asthma Neurologic:  [] Dizziness   [] Seizures   [] History of stroke   [] History of TIA  [] Aphasia   [] Vissual changes   [] Weakness or numbness in arm   [] Weakness or numbness in leg Musculoskeletal:   [] Joint swelling   [] Joint pain   [] Low back pain Hematologic:  [] Easy bruising  [] Easy bleeding   [] Hypercoagulable state   [] Anemic Gastrointestinal:  [] Diarrhea   [] Vomiting  [] Gastroesophageal reflux/heartburn   [] Difficulty swallowing. Genitourinary:  [] Chronic kidney disease   [] Difficult urination  [] Frequent urination   [] Blood in urine Skin:  [] Rashes   [] Ulcers  Psychological:  [] History of anxiety   []  History of major depression.  Physical Examination  There were no vitals filed for this visit. There is no height or weight on file to calculate BMI. Gen: WD/WN, NAD Head: [place]/AT, No temporalis wasting.  Ear/Nose/Throat: Hearing grossly intact, nares w/o erythema or drainage Eyes: PER, EOMI, sclera nonicteric.  Neck: Supple, no large masses.   Pulmonary:  Good air movement, no audible wheezing bilaterally, no use of accessory muscles.  Cardiac: RRR, no JVD Vascular: scattered varicosities present bilaterally.  Mild venous stasis changes to the legs bilaterally.  2-3+ soft pitting edema left > right Vessel Right Left  Radial Palpable  Palpable  PT Palpable Palpable  DP Palpable Palpable  Gastrointestinal: Non-distended. No guarding/no peritoneal signs.  Musculoskeletal: M/S 5/5 throughout.  No deformity or atrophy.  Neurologic: CN 2-12 intact. Symmetrical.  Speech is fluent. Motor exam as listed above. Psychiatric: Judgment intact, Mood & affect appropriate for pt's clinical situation. Dermatologic: Mild venous rashes no ulcers noted.  No changes consistent with cellulitis. Lymph : No lichenification or skin changes of chronic lymphedema.  CBC Lab Results  Component Value Date   WBC 9.5 10/05/2017   HGB 11.2 (L) 10/05/2017   HCT 34.4 (L) 10/05/2017   MCV 84.5 10/05/2017   PLT 181 10/05/2017    BMET    Component Value Date/Time   NA 136 10/05/2017 1738   NA 142 02/11/2015  1247   K 4.8 10/05/2017 1738   CL 100 10/05/2017 1738   CO2 27 10/05/2017 1738   GLUCOSE 317 (H) 10/05/2017 1738   BUN 22 10/05/2017 1738   BUN 24 02/11/2015 1247   CREATININE 1.70 (H) 10/05/2017 1738   CREATININE 1.37 (H) 03/21/2015 1451   CALCIUM 8.4 (L) 10/05/2017 1738   GFRNONAA 31 (L) 10/05/2017 1738   GFRAA 36 (L) 10/05/2017 1738   CrCl cannot be calculated (Patient's most recent lab result is older than the maximum 21 days allowed.).  COAG Lab Results  Component Value Date   INR 0.93 10/05/2017   INR 1.05 03/21/2015   INR 1.13 01/31/2015    Radiology No results found.   Assessment/Plan 1. Deep vein thrombosis (DVT) of femoral vein of right lower extremity, unspecified chronicity (HCC) Recommend:   No surgery or intervention at this point in time.  IVC filter is not indicated at present.  Patient's duplex ultrasound of the venous system shows DVT from the popliteal to the femoral veins.  The patient is on anticoagulation   Elevation was stressed, use of a recliner was discussed.  I have had a long discussion with the patient regarding DVT and post phlebitic changes such as swelling and why it  causes  symptoms such as pain.  The patient will wear graduated compression stockings class 1 (20-30 mmHg), beginning after three full days of anticoagulation, on a daily basis a prescription was given. The patient will  beginning wearing the stockings first thing in the morning and removing them in the evening. The patient is instructed specifically not to sleep in the stockings.  In addition, behavioral modification including elevation during the day and avoidance of prolonged dependency will be initiated.    The patient will continue anticoagulation for now as there have not been any problems or complications at this point.  I have asked her to continue her anticoagulation up to 5 days prior to her surgery.  She can then begin a half dose Eliquis 2.5 twice daily after the surgery for 30 days and then she can stop her anticoagulation completely.   2. Essential hypertension Continue antihypertensive medications as already ordered, these medications have been reviewed and there are no changes at this time.   3. Chronic systolic heart failure (HCC) Continue cardiac and antihypertensive medications as already ordered and reviewed, no changes at this time.  Continue statin as ordered and reviewed, no changes at this time  Nitrates PRN for chest pain   4. Type 2 diabetes mellitus with moderate nonproliferative retinopathy and macular edema, with long-term current use of insulin, unspecified laterality ([place]) Continue hypoglycemic medications as already ordered, these medications have been reviewed and there are no changes at this time.  Hgb A1C to be monitored as already arranged by primary service     [name], MD  08/07/2018 1:02 PM

## 2018-08-08 ENCOUNTER — Encounter (INDEPENDENT_AMBULATORY_CARE_PROVIDER_SITE_OTHER): Payer: Self-pay | Admitting: Vascular Surgery

## 2018-08-10 ENCOUNTER — Other Ambulatory Visit (INDEPENDENT_AMBULATORY_CARE_PROVIDER_SITE_OTHER): Payer: Self-pay | Admitting: Nurse Practitioner

## 2018-08-10 DIAGNOSIS — I82411 Acute embolism and thrombosis of right femoral vein: Secondary | ICD-10-CM

## 2018-08-17 NOTE — Progress Notes (Signed)
Carelink Summary Report / Loop Recorder 

## 2018-08-25 ENCOUNTER — Telehealth: Payer: Self-pay

## 2018-08-25 NOTE — Telephone Encounter (Signed)
Left message for patient regarding disconnected monitor.  

## 2018-08-28 ENCOUNTER — Ambulatory Visit (INDEPENDENT_AMBULATORY_CARE_PROVIDER_SITE_OTHER): Payer: Medicare Other | Admitting: Psychiatry

## 2018-08-28 ENCOUNTER — Other Ambulatory Visit: Payer: Self-pay

## 2018-08-28 ENCOUNTER — Encounter: Payer: Self-pay | Admitting: Psychiatry

## 2018-08-28 DIAGNOSIS — F0630 Mood disorder due to known physiological condition, unspecified: Secondary | ICD-10-CM | POA: Diagnosis not present

## 2018-08-28 DIAGNOSIS — I639 Cerebral infarction, unspecified: Secondary | ICD-10-CM | POA: Diagnosis not present

## 2018-08-28 MED ORDER — GABAPENTIN 300 MG PO CAPS: 300.0000 mg | ORAL_CAPSULE | Freq: Every day | ORAL | 3 refills | Status: DC

## 2018-08-28 MED ORDER — PAROXETINE HCL 40 MG PO TABS: 40.0000 mg | ORAL_TABLET | Freq: Every day | ORAL | 1 refills | Status: DC

## 2018-08-28 NOTE — Progress Notes (Signed)
Patient ID: Jocelyn Wells, female   DOB: [birth year], 63 y.o.   MRN: [id number]

## 2018-08-28 NOTE — Progress Notes (Signed)
Patient ID: Jocelyn Wells, female   DOB: [birth year], 63 y.o.   MRN: [id number]   Patient is a 63 year old female who was followed up for medication management. She reported that she is currently doing fine and has been babysitting her grandchildren. She reported that she enjoys time with them. She is anxious when they will return back for their schoolwork. She stated that she has been monitoring her medications including her diabetes. She does not have any other acute symptoms. She is sleeping well at home. She denied having any suicidal or homicidal ideations or plans. Her mood remains stable.  Plan I will refill her medications. She will follow-up in two months earlier depending on her symptoms   I connected with patient via telemedicine application and verified that I am speaking with the correct person using two identifiers.  I discussed the limitations of evaluation and management by telemedicine and the availability of in person appointments. The patient expressed understanding and agreed to proceed.   I discussed the assessment and treatment plan with the patient. The patient was provided an opportunity to ask questions and all were answered. The patient agreed with the plan and demonstrated an understanding of the instructions.   The patient was advised to call back or seek an in-person evaluation if the symptoms worsen or if the condition fails to improve as anticipated.   I provided 15 minutes of non-face-to-face time during this encounter.

## 2018-09-08 ENCOUNTER — Ambulatory Visit (INDEPENDENT_AMBULATORY_CARE_PROVIDER_SITE_OTHER): Payer: Medicare Other | Admitting: *Deleted

## 2018-09-08 DIAGNOSIS — M810 Age-related osteoporosis without current pathological fracture: Secondary | ICD-10-CM | POA: Diagnosis not present

## 2018-09-08 DIAGNOSIS — I639 Cerebral infarction, unspecified: Secondary | ICD-10-CM | POA: Diagnosis not present

## 2018-09-08 DIAGNOSIS — I10 Essential (primary) hypertension: Secondary | ICD-10-CM | POA: Diagnosis not present

## 2018-09-08 DIAGNOSIS — E1142 Type 2 diabetes mellitus with diabetic polyneuropathy: Secondary | ICD-10-CM | POA: Diagnosis not present

## 2018-09-08 DIAGNOSIS — Z794 Long term (current) use of insulin: Secondary | ICD-10-CM | POA: Diagnosis not present

## 2018-09-08 DIAGNOSIS — E559 Vitamin D deficiency, unspecified: Secondary | ICD-10-CM | POA: Diagnosis not present

## 2018-09-08 DIAGNOSIS — E113393 Type 2 diabetes mellitus with moderate nonproliferative diabetic retinopathy without macular edema, bilateral: Secondary | ICD-10-CM | POA: Diagnosis not present

## 2018-09-08 DIAGNOSIS — E1159 Type 2 diabetes mellitus with other circulatory complications: Secondary | ICD-10-CM | POA: Diagnosis not present

## 2018-09-08 LAB — CUP PACEART REMOTE DEVICE CHECK
Date Time Interrogation Session: 20200731041403
Implantable Pulse Generator Implant Date: 20170627

## 2018-09-13 DIAGNOSIS — E1122 Type 2 diabetes mellitus with diabetic chronic kidney disease: Secondary | ICD-10-CM | POA: Diagnosis not present

## 2018-09-13 DIAGNOSIS — M810 Age-related osteoporosis without current pathological fracture: Secondary | ICD-10-CM | POA: Diagnosis not present

## 2018-09-13 DIAGNOSIS — E1129 Type 2 diabetes mellitus with other diabetic kidney complication: Secondary | ICD-10-CM | POA: Diagnosis not present

## 2018-09-13 DIAGNOSIS — F17200 Nicotine dependence, unspecified, uncomplicated: Secondary | ICD-10-CM | POA: Diagnosis not present

## 2018-09-13 DIAGNOSIS — E113393 Type 2 diabetes mellitus with moderate nonproliferative diabetic retinopathy without macular edema, bilateral: Secondary | ICD-10-CM | POA: Diagnosis not present

## 2018-09-13 DIAGNOSIS — I10 Essential (primary) hypertension: Secondary | ICD-10-CM | POA: Diagnosis not present

## 2018-09-13 DIAGNOSIS — E1159 Type 2 diabetes mellitus with other circulatory complications: Secondary | ICD-10-CM | POA: Diagnosis not present

## 2018-09-13 DIAGNOSIS — Z794 Long term (current) use of insulin: Secondary | ICD-10-CM | POA: Diagnosis not present

## 2018-09-13 DIAGNOSIS — Z9119 Patient's noncompliance with other medical treatment and regimen: Secondary | ICD-10-CM | POA: Diagnosis not present

## 2018-09-13 DIAGNOSIS — E1142 Type 2 diabetes mellitus with diabetic polyneuropathy: Secondary | ICD-10-CM | POA: Diagnosis not present

## 2018-09-13 DIAGNOSIS — R809 Proteinuria, unspecified: Secondary | ICD-10-CM | POA: Diagnosis not present

## 2018-09-13 DIAGNOSIS — E1165 Type 2 diabetes mellitus with hyperglycemia: Secondary | ICD-10-CM | POA: Diagnosis not present

## 2018-09-14 NOTE — Progress Notes (Signed)
Carelink Summary Report / Loop Recorder 

## 2018-10-06 ENCOUNTER — Other Ambulatory Visit: Payer: Self-pay | Admitting: Family Medicine

## 2018-10-06 NOTE — Telephone Encounter (Signed)
Pharmacy requesting refills. Thanks!  

## 2018-10-11 ENCOUNTER — Ambulatory Visit (INDEPENDENT_AMBULATORY_CARE_PROVIDER_SITE_OTHER): Payer: Medicare Other | Admitting: *Deleted

## 2018-10-11 DIAGNOSIS — I639 Cerebral infarction, unspecified: Secondary | ICD-10-CM

## 2018-10-12 DIAGNOSIS — M4856XD Collapsed vertebra, not elsewhere classified, lumbar region, subsequent encounter for fracture with routine healing: Secondary | ICD-10-CM | POA: Diagnosis not present

## 2018-10-12 DIAGNOSIS — M4854XD Collapsed vertebra, not elsewhere classified, thoracic region, subsequent encounter for fracture with routine healing: Secondary | ICD-10-CM | POA: Diagnosis not present

## 2018-10-12 LAB — CUP PACEART REMOTE DEVICE CHECK
Date Time Interrogation Session: 20200902101029
Implantable Pulse Generator Implant Date: 20170627

## 2018-10-18 ENCOUNTER — Telehealth: Payer: Self-pay | Admitting: *Deleted

## 2018-10-18 DIAGNOSIS — Z122 Encounter for screening for malignant neoplasm of respiratory organs: Secondary | ICD-10-CM

## 2018-10-18 DIAGNOSIS — Z87891 Personal history of nicotine dependence: Secondary | ICD-10-CM

## 2018-10-18 NOTE — Telephone Encounter (Signed)
Contacted and rescheduled for lung screening scan. See prior note for eligibility information.

## 2018-10-19 ENCOUNTER — Other Ambulatory Visit: Payer: Self-pay | Admitting: Physician Assistant

## 2018-10-19 DIAGNOSIS — M4854XD Collapsed vertebra, not elsewhere classified, thoracic region, subsequent encounter for fracture with routine healing: Secondary | ICD-10-CM

## 2018-10-23 ENCOUNTER — Encounter: Payer: Self-pay | Admitting: Psychiatry

## 2018-10-23 ENCOUNTER — Ambulatory Visit (INDEPENDENT_AMBULATORY_CARE_PROVIDER_SITE_OTHER): Payer: Medicare Other | Admitting: Psychiatry

## 2018-10-23 ENCOUNTER — Other Ambulatory Visit: Payer: Self-pay

## 2018-10-23 DIAGNOSIS — F0630 Mood disorder due to known physiological condition, unspecified: Secondary | ICD-10-CM

## 2018-10-23 DIAGNOSIS — I639 Cerebral infarction, unspecified: Secondary | ICD-10-CM | POA: Diagnosis not present

## 2018-10-23 MED ORDER — PAROXETINE HCL 40 MG PO TABS: 40.0000 mg | ORAL_TABLET | Freq: Every day | ORAL | 1 refills | Status: DC

## 2018-10-23 MED ORDER — GABAPENTIN 300 MG PO CAPS: 300.0000 mg | ORAL_CAPSULE | Freq: Every day | ORAL | 3 refills | Status: DC

## 2018-10-23 NOTE — Progress Notes (Signed)
Patient ID: Jocelyn Wells, female   DOB: April 13, 1955, 63 y.o.   MRN: [id number]   Patient is a 63 year old female with history of mood disorder who was followed up for medication management. She reported that she has been doing well and returned after a long trip as she went with her husband to [place] and [place]. She reported that her husband drove and they enjoyed the trip. She stated that they stopped over at several places. Patient reported that she has been compliant with her medications and has been doing well. She stated that she does not have any side effects the medication. She has been currently stable and no other acute symptoms noted.  Plan Continue medications as prescribed. Follow-up in one to two months depending on her symptoms

## 2018-10-25 ENCOUNTER — Ambulatory Visit: Payer: Medicare Other | Attending: Nurse Practitioner

## 2018-10-26 NOTE — Progress Notes (Signed)
Carelink Summary Report / Loop Recorder 

## 2018-10-30 DIAGNOSIS — E1122 Type 2 diabetes mellitus with diabetic chronic kidney disease: Secondary | ICD-10-CM | POA: Diagnosis not present

## 2018-10-30 DIAGNOSIS — E1129 Type 2 diabetes mellitus with other diabetic kidney complication: Secondary | ICD-10-CM | POA: Diagnosis not present

## 2018-10-30 DIAGNOSIS — M810 Age-related osteoporosis without current pathological fracture: Secondary | ICD-10-CM | POA: Diagnosis not present

## 2018-10-30 DIAGNOSIS — F17200 Nicotine dependence, unspecified, uncomplicated: Secondary | ICD-10-CM | POA: Diagnosis not present

## 2018-10-30 DIAGNOSIS — Z794 Long term (current) use of insulin: Secondary | ICD-10-CM | POA: Diagnosis not present

## 2018-10-30 DIAGNOSIS — E559 Vitamin D deficiency, unspecified: Secondary | ICD-10-CM | POA: Diagnosis not present

## 2018-10-30 DIAGNOSIS — E1165 Type 2 diabetes mellitus with hyperglycemia: Secondary | ICD-10-CM | POA: Diagnosis not present

## 2018-10-30 DIAGNOSIS — R809 Proteinuria, unspecified: Secondary | ICD-10-CM | POA: Diagnosis not present

## 2018-10-30 DIAGNOSIS — E1159 Type 2 diabetes mellitus with other circulatory complications: Secondary | ICD-10-CM | POA: Diagnosis not present

## 2018-10-30 DIAGNOSIS — I10 Essential (primary) hypertension: Secondary | ICD-10-CM | POA: Diagnosis not present

## 2018-10-30 DIAGNOSIS — E1142 Type 2 diabetes mellitus with diabetic polyneuropathy: Secondary | ICD-10-CM | POA: Diagnosis not present

## 2018-10-30 DIAGNOSIS — E113393 Type 2 diabetes mellitus with moderate nonproliferative diabetic retinopathy without macular edema, bilateral: Secondary | ICD-10-CM | POA: Diagnosis not present

## 2018-11-01 ENCOUNTER — Other Ambulatory Visit: Payer: Self-pay

## 2018-11-01 ENCOUNTER — Ambulatory Visit
Admission: RE | Admit: 2018-11-01 | Discharge: 2018-11-01 | Disposition: A | Discharge status: Home / Self Care | Payer: Medicare Other | Source: Ambulatory Visit | Attending: Physician Assistant | Admitting: Physician Assistant

## 2018-11-01 DIAGNOSIS — M5104 Intervertebral disc disorders with myelopathy, thoracic region: Secondary | ICD-10-CM | POA: Diagnosis not present

## 2018-11-01 DIAGNOSIS — M4854XD Collapsed vertebra, not elsewhere classified, thoracic region, subsequent encounter for fracture with routine healing: Secondary | ICD-10-CM | POA: Diagnosis not present

## 2018-11-01 DIAGNOSIS — M5124 Other intervertebral disc displacement, thoracic region: Secondary | ICD-10-CM | POA: Diagnosis not present

## 2018-11-09 DIAGNOSIS — M4856XD Collapsed vertebra, not elsewhere classified, lumbar region, subsequent encounter for fracture with routine healing: Secondary | ICD-10-CM | POA: Diagnosis not present

## 2018-11-09 DIAGNOSIS — M4854XD Collapsed vertebra, not elsewhere classified, thoracic region, subsequent encounter for fracture with routine healing: Secondary | ICD-10-CM | POA: Diagnosis not present

## 2018-11-13 ENCOUNTER — Ambulatory Visit (INDEPENDENT_AMBULATORY_CARE_PROVIDER_SITE_OTHER): Payer: Medicare Other | Admitting: *Deleted

## 2018-11-13 DIAGNOSIS — I5022 Chronic systolic (congestive) heart failure: Secondary | ICD-10-CM

## 2018-11-13 DIAGNOSIS — I63429 Cerebral infarction due to embolism of unspecified anterior cerebral artery: Secondary | ICD-10-CM

## 2018-11-14 LAB — CUP PACEART REMOTE DEVICE CHECK
Date Time Interrogation Session: 20201005100716
Implantable Pulse Generator Implant Date: 20170627

## 2018-11-16 DIAGNOSIS — M5124 Other intervertebral disc displacement, thoracic region: Secondary | ICD-10-CM | POA: Diagnosis not present

## 2018-11-16 DIAGNOSIS — M8000XD Age-related osteoporosis with current pathological fracture, unspecified site, subsequent encounter for fracture with routine healing: Secondary | ICD-10-CM | POA: Diagnosis not present

## 2018-11-16 DIAGNOSIS — G894 Chronic pain syndrome: Secondary | ICD-10-CM | POA: Diagnosis not present

## 2018-11-21 NOTE — Progress Notes (Signed)
Carelink Summary Report / Loop Recorder 

## 2018-12-16 LAB — CUP PACEART REMOTE DEVICE CHECK
Date Time Interrogation Session: 20201107101117
Implantable Pulse Generator Implant Date: 20170627

## 2018-12-18 ENCOUNTER — Ambulatory Visit (INDEPENDENT_AMBULATORY_CARE_PROVIDER_SITE_OTHER): Payer: Medicare Other | Admitting: *Deleted

## 2018-12-18 DIAGNOSIS — I639 Cerebral infarction, unspecified: Secondary | ICD-10-CM

## 2018-12-18 DIAGNOSIS — Z794 Long term (current) use of insulin: Secondary | ICD-10-CM | POA: Diagnosis not present

## 2018-12-18 DIAGNOSIS — E1122 Type 2 diabetes mellitus with diabetic chronic kidney disease: Secondary | ICD-10-CM | POA: Diagnosis not present

## 2018-12-18 DIAGNOSIS — E559 Vitamin D deficiency, unspecified: Secondary | ICD-10-CM | POA: Diagnosis not present

## 2018-12-18 DIAGNOSIS — E1165 Type 2 diabetes mellitus with hyperglycemia: Secondary | ICD-10-CM | POA: Diagnosis not present

## 2018-12-18 DIAGNOSIS — N183 Chronic kidney disease, stage 3 unspecified: Secondary | ICD-10-CM | POA: Diagnosis not present

## 2018-12-20 DIAGNOSIS — E1122 Type 2 diabetes mellitus with diabetic chronic kidney disease: Secondary | ICD-10-CM | POA: Diagnosis not present

## 2018-12-20 DIAGNOSIS — E1165 Type 2 diabetes mellitus with hyperglycemia: Secondary | ICD-10-CM | POA: Diagnosis not present

## 2018-12-20 DIAGNOSIS — M810 Age-related osteoporosis without current pathological fracture: Secondary | ICD-10-CM | POA: Diagnosis not present

## 2018-12-20 DIAGNOSIS — E113393 Type 2 diabetes mellitus with moderate nonproliferative diabetic retinopathy without macular edema, bilateral: Secondary | ICD-10-CM | POA: Diagnosis not present

## 2018-12-20 DIAGNOSIS — E1129 Type 2 diabetes mellitus with other diabetic kidney complication: Secondary | ICD-10-CM | POA: Diagnosis not present

## 2018-12-20 DIAGNOSIS — F17200 Nicotine dependence, unspecified, uncomplicated: Secondary | ICD-10-CM | POA: Diagnosis not present

## 2018-12-20 DIAGNOSIS — Z23 Encounter for immunization: Secondary | ICD-10-CM | POA: Diagnosis not present

## 2018-12-20 DIAGNOSIS — I10 Essential (primary) hypertension: Secondary | ICD-10-CM | POA: Diagnosis not present

## 2018-12-20 DIAGNOSIS — E1142 Type 2 diabetes mellitus with diabetic polyneuropathy: Secondary | ICD-10-CM | POA: Diagnosis not present

## 2018-12-20 DIAGNOSIS — E1159 Type 2 diabetes mellitus with other circulatory complications: Secondary | ICD-10-CM | POA: Diagnosis not present

## 2018-12-20 DIAGNOSIS — Z794 Long term (current) use of insulin: Secondary | ICD-10-CM | POA: Diagnosis not present

## 2018-12-20 DIAGNOSIS — E1121 Type 2 diabetes mellitus with diabetic nephropathy: Secondary | ICD-10-CM | POA: Diagnosis not present

## 2019-01-17 NOTE — Progress Notes (Signed)
Carelink Summary Report / Loop Recorder 

## 2019-01-18 ENCOUNTER — Ambulatory Visit (INDEPENDENT_AMBULATORY_CARE_PROVIDER_SITE_OTHER): Payer: Medicare Other | Admitting: *Deleted

## 2019-01-18 DIAGNOSIS — M5124 Other intervertebral disc displacement, thoracic region: Secondary | ICD-10-CM | POA: Diagnosis not present

## 2019-01-18 DIAGNOSIS — G894 Chronic pain syndrome: Secondary | ICD-10-CM | POA: Diagnosis not present

## 2019-01-18 DIAGNOSIS — I63312 Cerebral infarction due to thrombosis of left middle cerebral artery: Secondary | ICD-10-CM

## 2019-01-18 LAB — CUP PACEART REMOTE DEVICE CHECK
Date Time Interrogation Session: 20201210121813
Implantable Pulse Generator Implant Date: 20170627

## 2019-01-29 ENCOUNTER — Encounter: Payer: Self-pay | Admitting: Student in an Organized Health Care Education/Training Program

## 2019-01-29 NOTE — Progress Notes (Signed)
Patient's Name: Jocelyn Wells  MRN: [id number]  Referring Provider: Meade Maw, MD  DOB: [birth year]  PCP: Margo Common, PA  DOS: 01/30/2019  Note by: Gillis Santa, MD  Service setting: Ambulatory outpatient  Specialty: Interventional Pain Management  Location: [facility] Management Virtual Visit  Visit type: Initial Patient Evaluation  Patient type: New Patient   Virtual Encounter - Pain Management PROVIDER NOTE: Information contained herein reflects review and annotations entered in association with encounter. Patient information is provided elsewhere in the medical record. Interpretation of information and data should be left to medically trained personnel. Document created using STT technology, any transcriptional errors that may result from process are unintentional.  Contact & Pharmacy Preferred: [phone] Home: [phone] (home) Mobile: [phone] (mobile) E-mail: [email or web address]  [id number] [place] [id number]: [phone]: [phone]  Pre-screening note:  Our staff contacted Jocelyn Wells and offered her an "in person", "face-to-face" appointment versus a telephone encounter. She indicated preferring the telephone encounter, at this time.  Primary Reason(s) for Visit: Tele-Encounter for initial evaluation of one or more chronic problems (new to examiner) potentially causing chronic pain, and posing a threat to normal musculoskeletal function. (Level of risk: High) CC: Back Pain (low)  I contacted Jocelyn Abbeon 01/30/2019 via video conference.      I clearly identified myself as BGillis Santa MD. I verified that I was speaking with the correct person using two identifiers (Name: Jocelyn Wells and date of birth: [birth year].  Advanced Informed Consent I sought verbal advanced consent from Jocelyn Wells virtual visit interactions. I informed Jocelyn Wells of possible security and  privacy concerns, risks, and limitations associated with providing "not-in-person" medical evaluation and management services. I also informed Jocelyn Wells of the availability of "in-person" appointments. Finally, I informed her that there would be a charge for the virtual visit and that she could be  personally, fully or partially, financially responsible for it. Jocelyn Wells understanding and agreed to proceed.   HPI  Ms. SIvieis a 63y.o. year old, female patient, contacted today for an initial evaluation of her chronic pain. She has H/O diabetes mellitus; CAFL (chronic airflow limitation) ([place]; H/O visual disturbance; H/O elevated lipids; H/O: HTN (hypertension); H/O: osteoarthritis; Initial insomnia; Anxiety, generalized; Depression, major, recurrent, moderate ([place]; Blood in feces; Contusion of cheek; Clinical depression; Diabetes mellitus type 2, uncontrolled ([place]; D (diarrhea); Disturbance of skin sensation; Dizziness and giddiness; Blood pressure elevated; Carbuncle and furuncle; Cephalalgia; HLD (hyperlipidemia); BP (high blood pressure); Difficulty hearing; Does not feel right; Acute onset aura migraine; Arthritis, degenerative; Pain of perianal area; Guttate psoriasis; Gonalgia; Change in blood platelet count; Current tobacco use; Microalbuminuria; Long term current use of insulin (HW[place]; Compulsive tobacco user syndrome; Nausea & vomiting; AKI (acute kidney injury) ([place]; CVA (cerebral infarction); Stroke (cerebrum) ([place]; Stroke ([facility]; Chronic systolic heart failure ([place]; Type 2 diabetes mellitus (HM[place]; Type 2 diabetes mellitus with other diabetic kidney complication (HFort [place]; Embolic stroke (HS[place]; Fatigue; Nicotine dependence; Snoring; Sleep paralysis, recurrent isolated; Hypersomnia with sleep apnea; Cataplexy; Morbid obesity due to excess calories (HTrent[place]; COPD exacerbation (HL[place]; Cerebrovascular accident (CVA) due to thrombosis of left middle cerebral artery (HH[place]; Depression (emotion);  Cerebrovascular accident (CVA) due to bilateral thrombosis of carotid arteries (HChippewa; Excessive daytime sleepiness; Type II diabetes mellitus with renal manifestations, uncontrolled (HP[place]; CVA (cerebral vascular accident) (HLinn; DVT (deep venous thrombosis) (HJay; Compression fracture of L1 lumbar vertebra (  [place]); Compression fracture of body of thoracic vertebra ([place]); Lumbar foraminal stenosis (RIGHT L1); and Spinal stenosis of lumbar region with neurogenic claudication (L4-L5) on their problem list.  Pain Assessment: Location: Lower Backmid and low back Radiating: radiates down both of legs in the back to knee Onset: More than a month agoAfter a fall last year Duration: Chronic painPresent throughout the day Quality: Shooting, StabbingAchy, throbbing, debilitating, sharp, severe Severity: 2 /10 (subjective, self-reported pain score)  Effect on ADL:  Limits ability to do household chores.  She is unable to walk up 2 flights of stairs given her pain.  She can walk approximately 100 to 200 feet she states.  Pain is worse with standing. Timing: IntermittentNot affected by time of day Modifying factors:  Sitting, laying down, resting, tramadol  Onset and Duration: Gradual and Present longer than 3 months Cause of pain: fall Severity: Getting worse, NAS-11 at its worse: 9/10, NAS-11 at its best: 2/10, NAS-11 now: 3/10 and NAS-11 on the average: 5/10 Timing: Night Aggravating Factors: Prolonged standing, Walking, Walking uphill and Walking downhill Alleviating Factors: Hot packs, Lying down, Medications and Resting Associated Problems: Pain that does not allow patient to sleep Quality of Pain: Intermittent, Shooting and Stabbing Previous Examinations or Tests: MRI scan, X-rays, Neurological evaluation, Neurosurgical evaluation, Orthopedic evaluation and Psychiatric evaluation Previous Treatments: Narcotic medications   Patient is a pleasant 64 year old female with multiple medical comorbidities  including history of CVA, type 2 diabetes, who presents with a chief complaint of mid and low back pain.  Patient states that she sustained a fall approximately a year ago when she sustained a compression fracture at T12 and L1.  She was scheduled to have kyphoplasty but unfortunately developed a venous thrombosis and was on anticoagulation.  Her subsequent management was impacted by coronavirus.  She had a thoracic MRI done on 11/01/2018 which showed T7-T8 disc protrusion on the right compressing the right side of the spinal cord and creating slight myelopathy based upon MRI read.  She recently saw neurosurgery on 01/18/2019.  She is having difficulty walking for an extended period of time.  Given that she is not having any symptoms consistent with thoracic myelopathy that would warrant urgent surgery, Dr. Cari Caraway has recommended pain management.  In regards to previous medications, patient has tried prednisone, tizanidine, tramadol, gabapentin, diclofenac.  She states that she takes tramadol 50 mg daily which does help her pain.  Of note she was previously seeing Dr. Gretel Acre in the past.  She would like to reestablish with psychiatry at [facility].  Patient does smoke.  She denies having tried epidural steroid injection.  Historic Controlled Substance Pharmacotherapy Review  PMP and historical list of controlled substances: Tramadol 50 mg BID prn (usually just takes at bedtime )  Medications: Bottles not available for inspection. Pharmacodynamics: Desired effects: Analgesia: The patient reports 50% benefit. Reported improvement in function: The patient reports medication allows her to accomplish basic ADLs. Clinically meaningful improvement in function (CMIF): Sustained CMIF goals met Perceived effectiveness: Described as relatively effective but with some room for improvement  Undesirable effects: Side-effects or Adverse reactions: None reported Historical Monitoring: The patient  reports no  history of drug use. List of all UDS Test(s): No results found for: MDMA, COCAINSCRNUR, [place], [place], CANNABQUANT, THCU, Progress List of other Serum/Urine Drug Screening Test(s):  No results found for: AMPHSCRSER, BARBSCRSER, BENZOSCRSER, COCAINSCRSER, COCAINSCRNUR, PCPSCRSER, PCPQUANT, THCSCRSER, THCU, CANNABQUANT, OPIATESCRSER, OXYSCRSER, PROPOXSCRSER, ETH Historical Background Evaluation: [place] PMP: PDMP reviewed during this encounter. Six (6)  year initial data search conducted.             [place] Department of public safety, offender search: Editor, commissioning Information) Non-contributory Risk Assessment Profile: Aberrant behavior: None observed or detected today Risk factors for fatal opioid overdose: nicotine dependence Fatal overdose hazard ratio (HR): Calculation deferred Non-fatal overdose hazard ratio (HR): Calculation deferred Risk of opioid abuse or dependence: 0.7-3.0% with doses ? 36 MME/day and 6.1-26% with doses ? 120 MME/day. Substance use disorder (SUD) risk level: Pending results of Medical Psychology Evaluation for SUD  Pharmacologic Plan: As per protocol, I have not taken over any controlled substance management, pending the results of ordered tests and/or consults.            Initial impression: Pending review of available data and ordered tests.  Meds   Current Outpatient Medications:  .  atorvastatin (LIPITOR) 40 MG tablet, Take 1 tablet (40 mg total) by mouth daily., Disp: 90 tablet, Rfl: 3 .  B-D INS SYRINGE 0.5CC/31GX5/16 31G X 5/16" 0.5 ML MISC, , Disp: , Rfl:  .  calcipotriene (DOVONOX) 0.005 % cream, Apply topically as needed. For psoriasis flares, Disp: , Rfl:  .  carvedilol (COREG) 12.5 MG tablet, TAKE 1 TABLET (12.5 MG TOTAL) BY MOUTH 2 (TWO) TIMES DAILY., Disp: 60 tablet, Rfl: 11 .  denosumab (PROLIA) 60 MG/ML SOSY injection, Inject 60 mg into the skin every 6 (six) months., Disp: , Rfl:  .  gabapentin (NEURONTIN) 300 MG capsule, Take 1 capsule (300 mg total) by mouth  daily., Disp: 90 capsule, Rfl: 3 .  glucose blood test strip, OneTouch Verio strips, Disp: , Rfl:  .  glyBURIDE-metformin (GLUCOVANCE) 5-500 MG per tablet, Take 2 tablets by mouth 2 (two) times daily. , Disp: , Rfl:  .  hydrochlorothiazide (HYDRODIURIL) 25 MG tablet, Take 25 mg by mouth daily., Disp: , Rfl:  .  loratadine (CLARITIN) 10 MG tablet, Take 1 tablet by mouth daily., Disp: , Rfl:  .  losartan (COZAAR) 25 MG tablet, TAKE 2 TABLETS BY MOUTH EVERY DAY, Disp: 180 tablet, Rfl: 1 .  NOVOLIN 70/30 RELION (70-30) 100 UNIT/ML injection, Inject 10-50 Units into the skin 2 (two) times daily with a meal. 10units-AM,  40-50units-PM, Disp: , Rfl:  .  PARoxetine (PAXIL) 40 MG tablet, Take 1 tablet (40 mg total) by mouth daily., Disp: 90 tablet, Rfl: 1 .  traMADol (ULTRAM) 50 MG tablet, Take 50 mg by mouth every 6 (six) hours as needed. , Disp: , Rfl:  .  triamcinolone cream (KENALOG) 0.1 %, Apply 1 application topically as needed (for psoriasis flares). , Disp: , Rfl:  .  Vitamin D, Ergocalciferol, (DRISDOL) 1.25 MG (50000 UT) CAPS capsule, Take 50,000 Units by mouth once a week., Disp: , Rfl:  .  BYDUREON 2 MG PEN, Inject 2 mg into the skin once a week. SATURDAY, Disp: , Rfl:   ROS  Cardiovascular: High blood pressure, history of blood clot, Implanted Loop Recorder Pulmonary or Respiratory: Lung problems, COPD Neurological: Stroke (Residual deficits or weakness: 4 yrs ago) Review of Past Neurological Studies:  Results for orders placed or performed during the hospital encounter of 10/05/17  MR BRAIN WO CONTRAST   Narrative   CLINICAL DATA:  63 y/o F; slurred speech, dizziness, right-sided weakness.  EXAM: MRI HEAD WITHOUT CONTRAST  MRA HEAD WITHOUT CONTRAST  TECHNIQUE: Multiplanar, multiecho pulse sequences of the brain and surrounding structures were obtained without intravenous contrast. Angiographic images of the head were obtained using MRA technique  without contrast.  COMPARISON:   01/28/2018 CTA, CT, and MRI of the head. 10/05/2017 CT head.  FINDINGS: MRI HEAD FINDINGS  Brain: No acute infarction, hemorrhage, hydrocephalus, extra-axial collection or mass lesion. Several nonspecific T2 FLAIR hyperintensities in subcortical and periventricular white matter are compatible with mild chronic microvascular ischemic progressed from 2016. Mild volume loss of the brain.  Vascular: As below.  Skull and upper cervical spine: Normal marrow signal.  Sinuses/Orbits: Moderate left maxillary and anterior ethmoid air cell mucosal thickening. No significant abnormal signal of the mastoid air cells. Orbits are unremarkable.  Other: None.  MRA HEAD FINDINGS  Internal carotid arteries:  Patent.  Anterior cerebral arteries:  Patent.  Middle cerebral arteries: Patent.  Anterior communicating artery: Patent.  Posterior communicating arteries:  Patent.  Posterior cerebral arteries:  Patent.  Basilar artery:  Patent.  Vertebral arteries:  Patent.  No evidence of high-grade stenosis, large vessel occlusion, or aneurysm.  IMPRESSION: 1. No acute intracranial abnormality. 2. Patent anterior and posterior intracranial circulation. No large vessel occlusion, aneurysm, or significant stenosis is identified. 3. Mild chronic microvascular ischemic changes and volume loss of the brain is progressed from 2016. 4. Moderate left maxillary and left anterior ethmoid mucosal thickening.   Electronically Signed   By: Kristine Garbe M.D.   On: 10/06/2017 06:07   CT Head Wo Contrast   Narrative   CLINICAL DATA:  Concern for TIA. Slurred speech. Increased weakness and confusion. History of stroke.  EXAM: CT HEAD WITHOUT CONTRAST  TECHNIQUE: Contiguous axial images were obtained from the base of the skull through the vertex without intravenous contrast.  COMPARISON:  09/19/2017; 01/29/2015; brain MRI-01/29/2015  FINDINGS: Brain: Similar findings of atrophy  with sulcal prominence. Minimal periventricular hypodensities compatible with microvascular ischemic disease. No CT evidence of acute large territory infarct. No intraparenchymal or extra-axial mass or hemorrhage. Unchanged size and configuration of the ventricles and the basilar cisterns. No midline shift.  Vascular: No hyperdense vessel or unexpected calcification.  Skull: No displaced calvarial fracture.  Sinuses/Orbits: Limited visualization of the paranasal sinuses and mastoid air cells is normal. No air-fluid levels.  Other: Regional soft tissues appear normal.  IMPRESSION: Similar findings of atrophy and mild microvascular ischemic disease without superimposed acute intracranial process.   Electronically Signed   By: Sandi Mariscal M.D.   On: 10/05/2017 13:38    Psychological-Psychiatric: Anxiousness and Depressed Gastrointestinal: Vomiting blood (Ulcers) Genitourinary: No reported renal or genitourinary signs or symptoms such as difficulty voiding or producing urine, peeing blood, non-functioning kidney, kidney stones, difficulty emptying the bladder, difficulty controlling the flow of urine, or chronic kidney disease Hematological: No reported hematological signs or symptoms such as prolonged bleeding, low or poor functioning platelets, bruising or bleeding easily, hereditary bleeding problems, low energy levels due to low hemoglobin or being anemic Endocrine: High blood sugar requiring insulin (IDDM) Rheumatologic: Joint aches and or swelling due to excess weight (Osteoarthritis) Musculoskeletal: Negative for myasthenia gravis, muscular dystrophy, multiple sclerosis or malignant hyperthermia Work History: Retired  Allergies  Jocelyn Wells is allergic to codeine.  Laboratory Chemistry Profile   Screening Lab Results  Component Value Date   HIV Non Reactive 10/06/2017    Inflammation (CRP: Acute Phase) (ESR: Chronic Phase) No results found for: CRP, ESRSEDRATE,  LATICACIDVEN                       Rheumatology Lab Results  Component Value Date   LABURIC 6.2 09/12/2017  Renal Lab Results  Component Value Date   BUN 22 10/05/2017   CREATININE 1.70 (H) 10/05/2017   BCR 20 02/11/2015   GFRAA 36 (L) 10/05/2017   GFRNONAA 31 (L) 10/05/2017                             Hepatic Lab Results  Component Value Date   AST 18 10/05/2017   ALT 15 10/05/2017   ALBUMIN 3.2 (L) 10/05/2017   ALKPHOS 133 (H) 10/05/2017                        Electrolytes Lab Results  Component Value Date   NA 136 10/05/2017   K 4.8 10/05/2017   CL 100 10/05/2017   CALCIUM 8.4 (L) 10/05/2017                        Neuropathy Lab Results  Component Value Date   HGBA1C 12.2 (H) 10/06/2017   HIV Non Reactive 10/06/2017                         Coagulation Lab Results  Component Value Date   INR 0.93 10/05/2017   LABPROT 12.4 10/05/2017   APTT <24 (L) 10/05/2017   PLT 181 10/05/2017                        Cardiovascular Lab Results  Component Value Date   BNP 177.0 (H) 06/02/2015   TROPONINI <0.03 10/05/2017   HGB 11.2 (L) 10/05/2017   HCT 34.4 (L) 10/05/2017                         ID Lab Results  Component Value Date   HIV Non Reactive 10/06/2017     Note: Lab results reviewed.  Imaging Review    Thoracic Imaging: Thoracic MR wo contrast:  Results for orders placed during the hospital encounter of 11/01/18  MR THORACIC SPINE WO CONTRAST   Narrative CLINICAL DATA:  Upper, mid, and low back pain for 1 year. The patient was injured in a fall at that time.  EXAM: MRI THORACIC SPINE WITHOUT CONTRAST  TECHNIQUE: Multiplanar, multisequence MR imaging of the thoracic spine was performed. No intravenous contrast was administered.  COMPARISON:  CT scan of the chest dated 04/26/2017 and lumbar MRI dated December 20, 2017 and lumbar radiographs dated 11/15/2017  FINDINGS: Alignment:  Physiologic.  Vertebrae: There  is an old mild compression fracture of the right side of the T12 vertebral body. There is also an old mild compression fracture of the left side of the inferior endplate of L1. No acute fractures or bone destruction.  Cord: There is compression of the right side of the spinal cord at T7-8 by a prominent partially calcified disc protrusion. There is subtle increased signal from the spinal cord on images 23 through 24 of series 21. There is minimal indentation upon the ventral aspect of the spinal cord by a small central disc bulge at T3-4. tip of the conus is at L1-2.  Paraspinal and other soft tissues: Negative.  Disc levels:  C7-T1 and T1-2: Negative.  T2-3: Tiny disc bulge asymmetric to the right with no neural impingement. Otherwise negative.  T3-4: Tiny central disc bulge slightly indenting the ventral aspect of the thecal sac without significant neural impingement.  T4-5  through T6-7: Negative.  T7-8: Prominent partially calcified disc protrusion to the right of midline compressing the right side of the spinal cord and creating slight myelopathy.  T8-9: Tiny disc protrusion to the left of midline slightly distorting the thecal sac. Widely patent neural foramina.  T9-10: Disc protrusion extending into the left lateral recess. Widely patent neural foramina.  T10-11 and T11-12: Negative.  T12-L1: Old healed compression fracture of the right side of the T12 vertebral body. Small broad-based disc bulge extending into the right neural foramen. There appears to be severe right foraminal stenosis at the lateral aspect of the neural foramen on the axial images best seen on image 38 series 20.  IMPRESSION: 1. Severe right foraminal stenosis at T12-L1 which could affect the right T12 nerve. 2. Prominent partially calcified disc protrusion at T7-8 to the right compressing the right side of the spinal cord with slight myelopathy. 3. Old healed compression fracture of the  right side of the T12 vertebral body.   Electronically Signed   By: Lorriane Shire M.D.   On: 11/01/2018 16:58    Lumbosacral Imaging: Lumbar MR wo contrast:  Results for orders placed during the hospital encounter of 12/20/17  MR LUMBAR SPINE WO CONTRAST   Narrative CLINICAL DATA:  Fall with back pain and compression fracture.  EXAM: MRI LUMBAR SPINE WITHOUT CONTRAST  TECHNIQUE: Multiplanar, multisequence MR imaging of the lumbar spine was performed. No intravenous contrast was administered.  COMPARISON:  Lumbar radiography 11/15/2017  FINDINGS: Segmentation:  5 lumbar type vertebral bodies  Alignment:  Slight anterolisthesis at L4-5.  Vertebrae: There is a band of marrow edema across the T12 body with depression of the inferior endplate. Height loss is nearly 50%. There is also a band of marrow edema below the mildly depressed L1 inferior endplate, left-sided. Marrow edema within the L1 right transverse process with nondisplaced hypointense fracture line.  Conus medullaris and cauda equina: Conus extends to the L1-2 level. Conus and cauda equina appear normal.  Paraspinal and other soft tissues: Edema within right intrinsic back muscles below the levels of fracturing, consistent with strain.  Disc levels:  T12- L1: Disc narrowing and bulging with mild facet spurring. The canal and foramina are patent based on sagittal acquisition  L1-L2: Right foraminal protrusion with L1 impingement, see sagittal acquisitions.  L2-L3: Disc narrowing with biforaminal bulging and mild facet spurring. No impingement  L3-L4: Annular fissure with broad right paracentral to foraminal protrusion contacting the right L4 and L3 nerve roots.  L4-L5: Degenerative posterior element hypertrophy with mild anterolisthesis. Moderate spinal stenosis. Either L5 nerve root could be affected in the subarticular recesses. The foramina are patent  L5-S1:Facet spurring and disc bulging without  impingement  IMPRESSION: 1. Subacute T12 and L1 compression fractures. The inferior endplate of E42 is depressed, causing nearly 50% height loss at this level. L1 depression is minor. No retropulsion. 2. L1 right transverse process fracture with subjacent strain. 3. L1-2 right foraminal herniation with L1 impingement. 4. L3-4 right paracentral to foraminal protrusion which could affect the L3 and L4 nerve roots. 5. L4-5 moderate degenerative spinal stenosis.   Electronically Signed   By: Monte Fantasia M.D.   On: 12/21/2017 09:02    Lumbar DG (Complete) 4+V:  Results for orders placed in visit on 11/15/17  DG Lumbar Spine Complete   Narrative CLINICAL DATA:  63 year old female fell 3 weeks ago. Pain across lower back. Initial encounter.  EXAM: LUMBAR SPINE - COMPLETE 4+ VIEW  COMPARISON:  04/26/2017 chest CT.  FINDINGS: Mild loss of height T12 vertebra new from prior chest CT and may represent result of compression fracture without retropulsion. No other compression fracture noted.  Minimal Schmorl's node deformities inferior endplate L3, L4 and L5. Facet degenerative changes L2-3 through L5-S1.  Vascular calcifications.  Gallstones.  IMPRESSION: 1. Mild loss of height T12 vertebra is new compared to prior chest CT suggesting mild compression fracture without retropulsion. MR or CT can be obtained for further delineation. 2. Small Schmorl's node deformities inferior endplate L3, L4 and L5. 3.  Aortic Atherosclerosis (ICD10-I70.0). 4. Gallstones.  These results will be called to the ordering clinician or representative by the Radiologist Assistant, and communication documented in the PACS or zVision Dashboard.   Electronically Signed   By: Genia Del M.D.   On: 11/15/2017 18:09            Hip-L DG 2-3 views:  Results for orders placed during the hospital encounter of 01/30/16  DG HIP UNILAT WITH PELVIS 2-3 VIEWS LEFT   Narrative CLINICAL DATA:  Left  hip pain status post fall.  EXAM: DG HIP (WITH OR WITHOUT PELVIS) 2-3V LEFT  COMPARISON:  None.  FINDINGS: There is no evidence of hip fracture or dislocation. There is no evidence of arthropathy or other focal bone abnormality.  IMPRESSION: Negative.   Electronically Signed   By: Fidela Salisbury M.D.   On: 01/30/2016 14:51     Ankle-L DG Complete:  Results for orders placed during the hospital encounter of 01/30/16  DG Ankle Complete Left   Narrative CLINICAL DATA:  Fall.  EXAM: LEFT ANKLE COMPLETE - 3+ VIEW  COMPARISON:  MRI 04/21/2011 .  FINDINGS: Diffuse osteopenia degenerative change. No evidence of fracture or dislocation. No acute abnormality .  IMPRESSION: Diffuse osteopenia and degenerative change.  No acute abnormality.   Electronically Signed   By: Marcello Moores  Register   On: 01/30/2016 14:51      Complexity Note: Imaging results reviewed. Results shared with Jocelyn Wells, using Layman's terms.                         PFSH  Drug: Ms. Hazell  reports no history of drug use. Alcohol:  reports no history of alcohol use. Tobacco:  reports that she has been smoking cigarettes. She started smoking about 45 years ago. She has a 36.00 pack-year smoking history. She has never used smokeless tobacco. Medical:  has a past medical history of Anxiety, Cardiomyopathy ([place]), COPD (chronic obstructive pulmonary disease) ([place]), Depression, Diabetes mellitus, type II ([place]), HTN (hypertension), Obesity, Osteoporosis, PONV (postoperative nausea and vomiting), and Stroke ([place]). Family: family history includes Alcohol abuse in her paternal aunt; Aneurysm in her father; Asthma in her mother; COPD in her brother; Diabetes in her brother and mother; Heart disease in her father and mother.  Past Surgical History:  Procedure Laterality Date  . ABDOMINAL HYSTERECTOMY    . ANKLE FRACTURE SURGERY Left 2002  . BILATERAL SALPINGOOPHORECTOMY  2000  . CARDIAC CATHETERIZATION N/A  03/25/2015   Procedure: Right/Left Heart Cath and Coronary Angiography;  Surgeon: Belva Crome, MD;  Location: [facility];  Service: Cardiovascular;  Laterality: N/A;  . [place]  . COLONOSCOPY WITH PROPOFOL N/A 09/22/2016   Procedure: COLONOSCOPY WITH PROPOFOL;  Surgeon: Manya Silvas, MD;  Location: [facility] ENDOSCOPY;  Service: Endoscopy;  Laterality: N/A;  . EP IMPLANTABLE DEVICE N/A 08/05/2015   Procedure: Loop Recorder  Insertion;  Surgeon: Deboraha Sprang, MD;  Location: [place];  Service: Cardiovascular;  Laterality: N/A;  . ESOPHAGOGASTRODUODENOSCOPY (EGD) WITH PROPOFOL N/A 09/22/2016   Procedure: ESOPHAGOGASTRODUODENOSCOPY (EGD) WITH PROPOFOL;  Surgeon: Manya Silvas, MD;  Location: [facility] ENDOSCOPY;  Service: Endoscopy;  Laterality: N/A;  . ESOPHAGOGASTRODUODENOSCOPY (EGD) WITH PROPOFOL N/A 12/08/2016   Procedure: ESOPHAGOGASTRODUODENOSCOPY (EGD) WITH PROPOFOL;  Surgeon: Manya Silvas, MD;  Location: [facility] ENDOSCOPY;  Service: Endoscopy;  Laterality: N/A;  . KNEE ARTHROSCOPY Left 2005  . TEE WITHOUT CARDIOVERSION N/A 01/31/2015   Procedure: TRANSESOPHAGEAL ECHOCARDIOGRAM (TEE);  Surgeon: Lelon Perla, MD;  Location: [facility];  Service: Cardiovascular;  Laterality: N/A;  . [place]  . ULNAR NERVE TRANSPOSITION  2008   Active Ambulatory Problems    Diagnosis Date Noted  . H/O diabetes mellitus 06/05/2014  . CAFL (chronic airflow limitation) ([place]) 06/05/2014  . H/O visual disturbance 06/05/2014  . H/O elevated lipids 06/05/2014  . H/O: HTN (hypertension) 06/05/2014  . H/O: osteoarthritis 06/05/2014  . Initial insomnia 06/05/2014  . Anxiety, generalized 06/05/2014  . Depression, major, recurrent, moderate ([place]) 06/05/2014  . Blood in feces 06/01/2007  . Contusion of cheek 03/31/2009  . Clinical depression 10/22/2005  . Diabetes mellitus type 2, uncontrolled ([place]) 10/22/2005  . D (diarrhea) 06/01/2007  . Disturbance of skin  sensation 09/02/2006  . Dizziness and giddiness 07/05/2014  . Blood pressure elevated 07/05/2014  . Carbuncle and furuncle 07/05/2014  . Cephalalgia 05/27/2006  . HLD (hyperlipidemia) 10/22/2005  . BP (high blood pressure) 07/05/2014  . Difficulty hearing 07/25/2006  . Does not feel right 07/05/2014  . Acute onset aura migraine 06/27/2006  . Arthritis, degenerative 10/22/2005  . Pain of perianal area 07/05/2014  . Guttate psoriasis 07/05/2014  . Gonalgia 07/05/2014  . Change in blood platelet count 06/08/2007  . Current tobacco use 10/22/2005  . Microalbuminuria 12/23/2013  . Long term current use of insulin ([place]) 12/23/2013  . Compulsive tobacco user syndrome 12/23/2013  . Nausea & vomiting 01/29/2015  . AKI (acute kidney injury) ([place]) 01/29/2015  . CVA (cerebral infarction) 01/29/2015  . Stroke (cerebrum) ([place]) 01/29/2015  . Stroke ([place]) 01/31/2015  . Chronic systolic heart failure ([place]) 03/25/2015  . Type 2 diabetes mellitus ([place]) 04/07/2015  . Type 2 diabetes mellitus with other diabetic kidney complication ([place]) 84/53/6468  . Embolic stroke ([place]) 04/29/2246  . Fatigue 04/23/2015  . Nicotine dependence 04/24/2015  . Snoring 04/24/2015  . Sleep paralysis, recurrent isolated 04/24/2015  . Hypersomnia with sleep apnea 04/24/2015  . Cataplexy 04/24/2015  . Morbid obesity due to excess calories ([place]) 04/24/2015  . COPD exacerbation ([place]) 04/24/2015  . Cerebrovascular accident (CVA) due to thrombosis of left middle cerebral artery ([place]) 04/24/2015  . Depression (emotion) 06/12/2015  . Cerebrovascular accident (CVA) due to bilateral thrombosis of carotid arteries ([place]) 06/12/2015  . Excessive daytime sleepiness 06/12/2015  . Type II diabetes mellitus with renal manifestations, uncontrolled ([place]) 12/23/2013  . CVA (cerebral vascular accident) ([place]) 10/05/2017  . DVT (deep venous thrombosis) ([place]) 02/09/2018  . Compression fracture of L1 lumbar vertebra ([place]) 01/30/2019  .  Compression fracture of body of thoracic vertebra ([place]) 01/30/2019  . Lumbar foraminal stenosis (RIGHT L1) 01/30/2019  . Spinal stenosis of lumbar region with neurogenic claudication (L4-L5) 01/30/2019   Resolved Ambulatory Problems    Diagnosis Date Noted  . No Resolved Ambulatory Problems   Past Medical History:  Diagnosis Date  . Anxiety   . Cardiomyopathy ([place])   .  COPD (chronic obstructive pulmonary disease) ([place])   . Depression   . Diabetes mellitus, type II ([place])   . HTN (hypertension)   . Obesity   . Osteoporosis   . PONV (postoperative nausea and vomiting)    Assessment  Primary Diagnosis & Pertinent Problem List: The primary encounter diagnosis was Compression fracture of L1 vertebra, sequela. Diagnoses of Compression fracture of body of thoracic vertebra (HCC), Lumbar foraminal stenosis (RIGHT L1), Spinal stenosis of lumbar region with neurogenic claudication (L4-L5), H/O diabetes mellitus, Cerebrovascular accident (CVA) due to thrombosis of left middle cerebral artery ([place]), Cerebrovascular accident (CVA) due to bilateral thrombosis of carotid arteries ([place]), Type 2 diabetes mellitus with other diabetic kidney complication ([place]), Nicotine dependence with withdrawal, unspecified nicotine product type, and Chronic pain syndrome were also pertinent to this visit.  Visit Diagnosis (New problems to examiner): 1. Compression fracture of L1 vertebra, sequela   2. Compression fracture of body of thoracic vertebra (HCC)   3. Lumbar foraminal stenosis (RIGHT L1)   4. Spinal stenosis of lumbar region with neurogenic claudication (L4-L5)   5. H/O diabetes mellitus   6. Cerebrovascular accident (CVA) due to thrombosis of left middle cerebral artery ([place])   7. Cerebrovascular accident (CVA) due to bilateral thrombosis of carotid arteries ([place])   8. Type 2 diabetes mellitus with other diabetic kidney complication (HCC)   9. Nicotine dependence with withdrawal, unspecified nicotine  product type   10. Chronic pain syndrome    General Recommendations: The pain condition that the patient suffers from is best treated with a multidisciplinary approach that involves an increase in physical activity to prevent de-conditioning and worsening of the pain cycle, as well as psychological counseling (formal and/or informal) to address the co-morbid psychological affects of pain. Treatment will often involve judicious use of pain medications and interventional procedures to decrease the pain, allowing the patient to participate in the physical activity that will ultimately produce long-lasting pain reductions. The goal of the multidisciplinary approach is to return the patient to a higher level of overall function and to restore their ability to perform activities of daily living.  Plan of Care (Initial workup plan)  Note: Jocelyn Wells was reminded that as per protocol, today's visit has been an evaluation only. We have not taken over the patient's controlled substance management.  1.  Compression fracture at T12 and L1.  Patient was previously scheduled for kyphoplasty but did not occur due to patient going on anticoagulation for DVT and COVID-19 delay.  She is not on any sort of anticoagulation at this time.  Of note patient's thoracic MRI shows severe right foraminal stenosis at T12-L1 potentially affecting the T12 nerve root.  She also has T7-T8 disc protrusion compressing the right side of her spinal cord with slight myelopathy which she has seen neurosurgery for.  Dr. Cari Caraway did not recommend surgical intervention at this time and recommended pain management.  2.  We will have patient complete urine toxicology screen and psych assessment which is customary for new patients.  She previously used to see Dr. Gretel Acre at the [facility] pain clinic but given her departure, I will send her to Dr. Shea Evans to reestablish her care and also to complete pain psych assessment.  3.  Does find benefit  with tramadol 50 mg twice daily as needed, this can be an appropriate regimen for her so long as she completes and passes her urine tox screen and psych assessment.  4.  We also discussed interventional pain therapies including  epidural steroid injection at the T12-L1 region or in her lumbar lumbar region as she has nerve root compression in both areas.  Patient states that she will think about this and we can continue conversation in the future.  5.  History of CVA.  This could also be contributing to her centralized pain and balance issues.  Avoid NSAIDs and other sedative medications.  She has tried and failed gabapentin, tizanidine.  Orders Placed This Encounter  Procedures  . UDS (Comprehensive-24) (ToxAssure) ([facility]) (New Pt.)    Volume: 30 ml(s). Minimum 3 ml of urine is needed. Document temperature of fresh sample. Indications: Long term (current) use of opiate analgesic (Z79.891) Test#: [id number] (Comprehensive Profile)  . SUD Evaluation (Med.Psych.)    Referral Priority:   Routine    Referral Type:   Psychiatric    Referral Reason:   Specialty Services Required    Referred to Provider:   Ursula Alert, MD    Requested Specialty:   Psychiatry    Number of Visits Requested:   1    Pharmacological management options:  Opioid Analgesics: The patient was informed that there is no guarantee that she would be a candidate for opioid analgesics. The decision will be made following CDC guidelines. This decision will be based on the results of diagnostic studies, as well as Jocelyn Wells's risk profile.  Okay for tramadol 50 mg twice daily as needed  Membrane stabilizer: Tried and failed gabapentin.  Can consider low-dose Lyrica and/or Cymbalta.  Will discuss with psych if Cymbalta is being considered  Muscle relaxant: Avoid if possible given central sedative side effects which could increase her risk for fall  NSAID: Avoid given history of CVA  Other analgesic(s): To be determined at a  later time   Interventional management options: Jocelyn Wells was informed that there is no guarantee that she would be a candidate for interventional therapies. The decision will be based on the results of diagnostic studies, as well as Jocelyn Wells's risk profile.  Procedure(s) under consideration:  T12-L1 epidural steroid injection (reviewed thoracic MRI) Right L1-L2 epidural steroid injection (view lumbar MRI) L4-L5 ESI     Provider-requested follow-up: Return in about 3 weeks (around 02/20/2019) for Medication Management, virtual.  Future Appointments  Date Time Provider [place]  02/20/2019  8:05 AM [facility] DEVICE REMOTES [facility]  03/26/2019  8:05 AM [facility] DEVICE REMOTES [facility]  04/30/2019  8:05 AM [facility] DEVICE REMOTES [facility]  06/04/2019  8:05 AM [facility] DEVICE REMOTES [facility]    Total duration of non-face-to-face encounter: 45 minutes.  Primary Care Physician: Margo Common, PA Location: [facility] Note by: Gillis Santa, MD Date: 01/30/2019; Time: 11:18 AM  Note: This dictation was prepared with Dragon dictation. Any transcriptional errors that may result from this process are unintentional.

## 2019-01-30 ENCOUNTER — Ambulatory Visit
Payer: Medicare Other | Attending: Student in an Organized Health Care Education/Training Program | Admitting: Student in an Organized Health Care Education/Training Program

## 2019-01-30 ENCOUNTER — Other Ambulatory Visit: Payer: Self-pay

## 2019-01-30 ENCOUNTER — Encounter: Payer: Self-pay | Admitting: Student in an Organized Health Care Education/Training Program

## 2019-01-30 VITALS — Ht 66.0 in | Wt 223.0 lb

## 2019-01-30 DIAGNOSIS — M48061 Spinal stenosis, lumbar region without neurogenic claudication: Secondary | ICD-10-CM | POA: Insufficient documentation

## 2019-01-30 DIAGNOSIS — S32010S Wedge compression fracture of first lumbar vertebra, sequela: Secondary | ICD-10-CM

## 2019-01-30 DIAGNOSIS — S32010A Wedge compression fracture of first lumbar vertebra, initial encounter for closed fracture: Secondary | ICD-10-CM | POA: Insufficient documentation

## 2019-01-30 DIAGNOSIS — S22000A Wedge compression fracture of unspecified thoracic vertebra, initial encounter for closed fracture: Secondary | ICD-10-CM

## 2019-01-30 DIAGNOSIS — M48062 Spinal stenosis, lumbar region with neurogenic claudication: Secondary | ICD-10-CM

## 2019-01-30 DIAGNOSIS — G894 Chronic pain syndrome: Secondary | ICD-10-CM

## 2019-01-30 DIAGNOSIS — I63312 Cerebral infarction due to thrombosis of left middle cerebral artery: Secondary | ICD-10-CM

## 2019-01-30 DIAGNOSIS — I63033 Cerebral infarction due to thrombosis of bilateral carotid arteries: Secondary | ICD-10-CM

## 2019-01-30 DIAGNOSIS — E1129 Type 2 diabetes mellitus with other diabetic kidney complication: Secondary | ICD-10-CM

## 2019-01-30 DIAGNOSIS — Z8639 Personal history of other endocrine, nutritional and metabolic disease: Secondary | ICD-10-CM

## 2019-01-30 DIAGNOSIS — F17203 Nicotine dependence unspecified, with withdrawal: Secondary | ICD-10-CM

## 2019-02-19 ENCOUNTER — Encounter: Payer: Self-pay | Admitting: Student in an Organized Health Care Education/Training Program

## 2019-02-19 ENCOUNTER — Telehealth: Payer: Self-pay

## 2019-02-19 ENCOUNTER — Telehealth: Payer: Self-pay | Admitting: *Deleted

## 2019-02-19 NOTE — Telephone Encounter (Signed)
Attempted to call patient at both numbers listed to review allergies/meds prior to tomorrow's appointment.

## 2019-02-19 NOTE — Telephone Encounter (Signed)
She returned call re appt tomorrow

## 2019-02-20 ENCOUNTER — Other Ambulatory Visit: Payer: Self-pay

## 2019-02-20 ENCOUNTER — Ambulatory Visit
Payer: Medicare Other | Attending: Student in an Organized Health Care Education/Training Program | Admitting: Student in an Organized Health Care Education/Training Program

## 2019-02-20 ENCOUNTER — Ambulatory Visit (INDEPENDENT_AMBULATORY_CARE_PROVIDER_SITE_OTHER): Payer: Medicare Other | Admitting: *Deleted

## 2019-02-20 DIAGNOSIS — I63312 Cerebral infarction due to thrombosis of left middle cerebral artery: Secondary | ICD-10-CM

## 2019-02-20 DIAGNOSIS — Z8639 Personal history of other endocrine, nutritional and metabolic disease: Secondary | ICD-10-CM

## 2019-02-20 DIAGNOSIS — M48061 Spinal stenosis, lumbar region without neurogenic claudication: Secondary | ICD-10-CM

## 2019-02-20 DIAGNOSIS — S32010S Wedge compression fracture of first lumbar vertebra, sequela: Secondary | ICD-10-CM

## 2019-02-20 DIAGNOSIS — S22000A Wedge compression fracture of unspecified thoracic vertebra, initial encounter for closed fracture: Secondary | ICD-10-CM

## 2019-02-20 DIAGNOSIS — G894 Chronic pain syndrome: Secondary | ICD-10-CM

## 2019-02-20 DIAGNOSIS — M48062 Spinal stenosis, lumbar region with neurogenic claudication: Secondary | ICD-10-CM

## 2019-02-20 LAB — CUP PACEART REMOTE DEVICE CHECK
Date Time Interrogation Session: 20210112121755
Implantable Pulse Generator Implant Date: 20170627

## 2019-02-20 NOTE — Progress Notes (Signed)
Patient: Jocelyn Wells  Service Category: E/M  Provider: Gillis Santa, MD  DOB: 11/08/1955  DOS: 02/20/2019  Location: Office  MRN: [id number]  Setting: Ambulatory outpatient  Referring Provider: Margo Common, PA  Type: Established Patient  Specialty: Interventional Pain Management  PCP: Margo Common, PA  Location: Home  Delivery: TeleHealth     Virtual Encounter - Pain Management PROVIDER NOTE: Information contained herein reflects review and annotations entered in association with encounter. Interpretation of such information and data should be left to medically-trained personnel. Information provided to patient can be located elsewhere in the medical record under "Patient Instructions". Document created using STT-dictation technology, any transcriptional errors that may result from process are unintentional.    Contact & Pharmacy Preferred: [phone] Home: [phone] (home) Mobile: [phone] (mobile) E-mail: [email or web address]  [id number] [address], [address] [place] [address] Phone: [phone] Fax: [phone]   Pre-screening  Jocelyn Wells offered "in-person" vs "virtual" encounter. She indicated preferring virtual for this encounter.   Reason COVID-19*  Social distancing based on CDC and AMA recommendations.   I contacted Jocelyn Wells on 02/20/2019 via telephone.      I clearly identified myself as Gillis Santa, MD. I verified that I was speaking with the correct person using two identifiers ([name], and date of birth: 1955-08-29).  Consent I sought verbal advanced consent from Jocelyn Wells for virtual visit interactions. I informed Jocelyn Wells of possible security and privacy concerns, risks, and limitations associated with providing "not-in-person" medical evaluation and management services. I also informed Jocelyn Wells of the availability of "in-person" appointments. Finally, I informed her that there would  be a charge for the virtual visit and that she could be  personally, fully or partially, financially responsible for it. Jocelyn Wells expressed understanding and agreed to proceed.   Historic Elements   Jocelyn Wells is a 64 y.o. year old, female patient evaluated today after her last encounter by our practice on 02/19/2019. Jocelyn Wells  has a past medical history of Anxiety, Cardiomyopathy ([place]), COPD (chronic obstructive pulmonary disease) ([place]), Depression, Diabetes mellitus, type II ([place]), HTN (hypertension), Obesity, Osteoporosis, PONV (postoperative nausea and vomiting), and Stroke ([place]). She also  has a past surgical history that includes Abdominal hysterectomy; Tubal ligation (1978); Cesarean section (1978); Knee arthroscopy (Left, 2005); Ulnar nerve transposition (2008); Bilateral salpingoophorectomy (2000); Ankle fracture surgery (Left, 2002); TEE without cardioversion (N/A, 01/31/2015); Cardiac catheterization (N/A, 03/25/2015); Cardiac catheterization (N/A, 08/05/2015); Esophagogastroduodenoscopy (egd) with propofol (N/A, 09/22/2016); Colonoscopy with propofol (N/A, 09/22/2016); and Esophagogastroduodenoscopy (egd) with propofol (N/A, 12/08/2016). Jocelyn Wells has a current medication list which includes the following prescription(s): atorvastatin, b-d ins syringe 0.5cc/31gx5/16, bydureon, calcipotriene, carvedilol, denosumab, gabapentin, glucose blood, glyburide-metformin, hydrochlorothiazide, loratadine, losartan, mupirocin ointment, novolin 70/30 relion, ozempic (1 mg/dose), paroxetine, semaglutide (1 mg/dose), tramadol, triamcinolone cream, and vitamin d (ergocalciferol). She  reports that she has been smoking cigarettes. She started smoking about 45 years ago. She has a 36.00 pack-year smoking history. She has never used smokeless tobacco. She reports that she does not drink alcohol or use drugs. Jocelyn Wells is allergic to codeine.   HPI  Today, she is being contacted for medication  management.  Hasn't completed UDS or pain psych assessment (see first note) Reminded patient that she has to do this before our 2nd visit No charge for this visit  Laboratory Chemistry Profile (12 mo)  Renal: No results found for requested labs  within last 8760 hours.  Lab Results  Component Value Date   GFRAA 36 (L) 10/05/2017   GFRNONAA 31 (L) 10/05/2017   Hepatic: No results found for requested labs within last 8760 hours. Lab Results  Component Value Date   AST 18 10/05/2017   ALT 15 10/05/2017   Other: No results found for requested labs within last 8760 hours. Note: Above Lab results reviewed.  Imaging  CUP PACEART REMOTE DEVICE CHECK Carelink summary report received. Battery status OK. Normal device function. No new symptom episodes, tachy episodes, brady, or pause episodes. No new AF episodes. Monthly summary reports and ROV/PRN  Kathy Breach, RN, CCDS, CV [employer]   Assessment  The primary encounter diagnosis was Compression fracture of L1 vertebra, sequela. Diagnoses of Compression fracture of body of thoracic vertebra (HCC), Lumbar foraminal stenosis (RIGHT L1), Spinal stenosis of lumbar region with neurogenic claudication (L4-L5), H/O diabetes mellitus, Cerebrovascular accident (CVA) due to thrombosis of left middle cerebral artery ([place]), and Chronic pain syndrome were also pertinent to this visit.  Plan of Care   Follow up for 2nd pt visit after baseline UDS and pain psych assessment   Follow-up plan:   Return for After Psychological evaluation.     Recent Visits Date Type Provider Dept  01/30/19 Office Visit Gillis Santa, MD [facility]  Showing recent visits within past 90 days and meeting all other requirements   Today's Visits Date Type Provider Dept  02/20/19 Office Visit Gillis Santa, MD [facility]  Showing today's visits and meeting all other requirements   Future Appointments No visits were found meeting these  conditions.  Showing future appointments within next 90 days and meeting all other requirements   I discussed the assessment and treatment plan with the patient. The patient was provided an opportunity to ask questions and all were answered. The patient agreed with the plan and demonstrated an understanding of the instructions.  Patient advised to call back or seek an in-person evaluation if the symptoms or condition worsens.  Total duration of non-face-to-face encounter: 10 minutes.  Note by: Gillis Santa, MD Date: 02/20/2019; Time: 1:01 PM

## 2019-02-20 NOTE — Progress Notes (Signed)
ILR remote 

## 2019-02-23 ENCOUNTER — Telehealth: Payer: Self-pay | Admitting: *Deleted

## 2019-02-23 NOTE — Telephone Encounter (Signed)
Received alert for LINQ at RRT as of 02/19/19. No true AF documented since implant.   Spoke with patient to make her aware of options. She is not interested in explant at this time, but does appear to be overdue for f/u with Dr. Caryl Comes. Pt agreeable to scheduling appointment on 04/17/19 at 10:40am in [place] office. Agrees to unplug home monitor, will order return kit. No further questions at this time.  Unenrolled from [employer], return kit ordered to confirmed home address.

## 2019-02-23 NOTE — Telephone Encounter (Signed)
Thx

## 2019-02-24 ENCOUNTER — Other Ambulatory Visit: Payer: Self-pay | Admitting: Family Medicine

## 2019-02-25 NOTE — Telephone Encounter (Signed)
Requested Prescriptions  Pending Prescriptions Disp Refills  . atorvastatin (LIPITOR) 40 MG tablet [Pharmacy Med Name: ATORVASTATIN 40 MG TABLET] 30 tablet 0    Sig: TAKE 1 TABLET BY MOUTH EVERY DAY     Cardiovascular:  Antilipid - Statins Failed - 02/24/2019 12:39 AM      Failed - Total Cholesterol in normal range and within 360 days    Cholesterol  Date Value Ref Range Status  10/06/2017 190 0 - 200 mg/dL Final         Failed - LDL in normal range and within 360 days    LDL Cholesterol  Date Value Ref Range Status  10/06/2017 88 0 - 99 mg/dL Final    Comment:           Total Cholesterol/HDL:CHD Risk Coronary Heart Disease Risk Table                     Men   Women  1/2 Average Risk   3.4   3.3  Average Risk       5.0   4.4  2 X Average Risk   9.6   7.1  3 X Average Risk  23.4   11.0        Use the calculated Patient Ratio above and the CHD Risk Table to determine the patient's CHD Risk.        ATP III CLASSIFICATION (LDL):  <100     mg/dL   Optimal  100-129  mg/dL   Near or Above                    Optimal  130-159  mg/dL   Borderline  160-189  mg/dL   High  >190     mg/dL   Very High Performed at [facility], [address] [place], [place] [address]          Failed - HDL in normal range and within 360 days    HDL  Date Value Ref Range Status  10/06/2017 38 (L) >40 mg/dL Final         Failed - Triglycerides in normal range and within 360 days    Triglycerides  Date Value Ref Range Status  10/06/2017 322 (H) <150 mg/dL Final         Passed - Patient is not pregnant      Passed - Valid encounter within last 12 months    Recent Outpatient Visits          1 year ago Impaired balance as late effect of cerebrovascular accident   [place], Vickki Muff, [place]   1 year ago Cerebrovascular accident (CVA) due to embolism of anterior cerebral artery, unspecified blood vessel laterality ([place])   [place],  [place], [place]   1 year ago Acute gout of right ankle, unspecified cause   [facility] [place], Dionne Bucy, [place]   1 year ago Subacute pansinusitis   [place], [place], [place]   1 year ago Type II diabetes mellitus with renal manifestations, uncontrolled ([place])   [place], [place], [place]              Courtesy refill given of #30; no refills; needs OV.

## 2019-02-27 ENCOUNTER — Encounter: Payer: Self-pay | Admitting: *Deleted

## 2019-03-01 ENCOUNTER — Ambulatory Visit: Payer: Medicare Other | Attending: Internal Medicine

## 2019-03-01 DIAGNOSIS — Z23 Encounter for immunization: Secondary | ICD-10-CM | POA: Insufficient documentation

## 2019-03-01 NOTE — Progress Notes (Signed)
   Covid-19 Vaccination Clinic  Name:  Jocelyn Wells    MRN: [id number] DOB: [birth year]  03/01/2019  Jocelyn Wells was observed post Covid-19 immunization for 15 minutes without incidence. She was provided with Vaccine Information Sheet and instruction to access the V-Safe system.   Jocelyn Wells was instructed to call 911 with any severe reactions post vaccine: Marland Kitchen Difficulty breathing  . Swelling of your face and throat  . A fast heartbeat  . A bad rash all over your body  . Dizziness and weakness    Immunizations Administered    Name Date Dose VIS Date Route   Pfizer COVID-19 Vaccine 03/01/2019 12:36 PM 0.3 mL 01/19/2019 Intramuscular   Manufacturer: [place]   Lot: [id number]   [place]: [id number]

## 2019-03-05 ENCOUNTER — Other Ambulatory Visit: Payer: Self-pay | Admitting: Psychiatry

## 2019-03-20 ENCOUNTER — Other Ambulatory Visit: Payer: Self-pay | Admitting: Family Medicine

## 2019-03-20 NOTE — Telephone Encounter (Signed)
pt called states she needed a medication refill. pt had last seen dr. Marcelino Duster in [place]. pt was transfered to front desk where she was set up an appt for 03-30-19 with dr. Toy Care. pt needs enough medication to get to that appt.

## 2019-03-20 NOTE — Telephone Encounter (Signed)
Copied from [place] [id number]. Topic: Quick Communication - Rx Refill/Question >> Mar 20, 2019  2:34 PM Leward Quan A wrote: Medication: PARoxetine (PAXIL) 40 MG tablet   Has the patient contacted their pharmacy? Yes.   (Agent: If no, request that the patient contact the pharmacy for the refill.) (Agent: If yes, when and what did the pharmacy advise?)  Preferred Pharmacy (with phone number or street name): [id number] [place], [place] - [address]  Phone:  [phone] Fax:  [phone]     Agent: Please be advised that RX refills may take up to 3 business days. We ask that you follow-up with your pharmacy.

## 2019-03-22 ENCOUNTER — Ambulatory Visit: Payer: Medicare Other | Attending: Internal Medicine

## 2019-03-22 DIAGNOSIS — Z23 Encounter for immunization: Secondary | ICD-10-CM | POA: Insufficient documentation

## 2019-03-22 NOTE — Progress Notes (Signed)
   Covid-19 Vaccination Clinic  Name:  Jocelyn Wells    MRN: [id number] DOB: Nov 21, 1955  03/22/2019  Ms. Och was observed post Covid-19 immunization for 15 minutes without incidence. She was provided with Vaccine Information Sheet and instruction to access the V-Safe system.   Ms. Johnsen was instructed to call 911 with any severe reactions post vaccine: Marland Kitchen Difficulty breathing  . Swelling of your face and throat  . A fast heartbeat  . A bad rash all over your body  . Dizziness and weakness    Immunizations Administered    Name Date Dose VIS Date Route   Pfizer COVID-19 Vaccine 03/22/2019  1:00 PM 0.3 mL 01/19/2019 Intramuscular   Manufacturer: [place]   Lot: [id number]   [place]: [id number]

## 2019-03-23 ENCOUNTER — Other Ambulatory Visit: Payer: Self-pay | Admitting: Family Medicine

## 2019-03-23 NOTE — Telephone Encounter (Signed)
Patient advised as below.  

## 2019-03-23 NOTE — Telephone Encounter (Signed)
Must get check of fasting lipid panel to maintain safe refill of the Atorvastatin. Will give 30 day refill. Cardiology follow up scheduled on 04-17-19 and they may get lipid panel at that time. If not, we should get it here.

## 2019-03-23 NOTE — Telephone Encounter (Signed)
Requested medication (s) are due for refill today: yes  Requested medication (s) are on the active medication list: yes  Last refill:  02/25/2019  Future visit scheduled: no  Notes to clinic:  Patient due for labs   Requested Prescriptions  Pending Prescriptions Disp Refills   atorvastatin (LIPITOR) 40 MG tablet [Pharmacy Med Name: ATORVASTATIN 40 MG TABLET] 30 tablet 0    Sig: TAKE 1 TABLET BY MOUTH EVERY DAY      Cardiovascular:  Antilipid - Statins Failed - 03/23/2019  2:32 PM      Failed - Total Cholesterol in normal range and within 360 days    Cholesterol  Date Value Ref Range Status  10/06/2017 190 0 - 200 mg/dL Final          Failed - LDL in normal range and within 360 days    LDL Cholesterol  Date Value Ref Range Status  10/06/2017 88 0 - 99 mg/dL Final    Comment:           Total Cholesterol/HDL:CHD Risk Coronary Heart Disease Risk Table                     Men   Women  1/2 Average Risk   3.4   3.3  Average Risk       5.0   4.4  2 X Average Risk   9.6   7.1  3 X Average Risk  23.4   11.0        Use the calculated Patient Ratio above and the CHD Risk Table to determine the patient's CHD Risk.        ATP III CLASSIFICATION (LDL):  <100     mg/dL   Optimal  100-129  mg/dL   Near or Above                    Optimal  130-159  mg/dL   Borderline  160-189  mg/dL   High  >190     mg/dL   Very High Performed at [facility], [address] [place], [place] [address]           Failed - HDL in normal range and within 360 days    HDL  Date Value Ref Range Status  10/06/2017 38 (L) >40 mg/dL Final          Failed - Triglycerides in normal range and within 360 days    Triglycerides  Date Value Ref Range Status  10/06/2017 322 (H) <150 mg/dL Final          Passed - Patient is not pregnant      Passed - Valid encounter within last 12 months    Recent Outpatient Visits           1 year ago Impaired balance as late effect of cerebrovascular  accident   [place], Vickki Muff, [place]   1 year ago Cerebrovascular accident (CVA) due to embolism of anterior cerebral artery, unspecified blood vessel laterality ([place])   [place], [place], [place]   1 year ago Acute gout of right ankle, unspecified cause   [facility] [place], Dionne Bucy, [place]   1 year ago Subacute pansinusitis   [place], [place], [place]   1 year ago Type II diabetes mellitus with renal manifestations, uncontrolled [facility])   [place], Vickki Muff, [place]

## 2019-03-28 DIAGNOSIS — E113393 Type 2 diabetes mellitus with moderate nonproliferative diabetic retinopathy without macular edema, bilateral: Secondary | ICD-10-CM | POA: Diagnosis not present

## 2019-03-28 DIAGNOSIS — F17200 Nicotine dependence, unspecified, uncomplicated: Secondary | ICD-10-CM | POA: Diagnosis not present

## 2019-03-28 DIAGNOSIS — E1122 Type 2 diabetes mellitus with diabetic chronic kidney disease: Secondary | ICD-10-CM | POA: Diagnosis not present

## 2019-03-28 DIAGNOSIS — E1121 Type 2 diabetes mellitus with diabetic nephropathy: Secondary | ICD-10-CM | POA: Diagnosis not present

## 2019-03-28 DIAGNOSIS — E1165 Type 2 diabetes mellitus with hyperglycemia: Secondary | ICD-10-CM | POA: Diagnosis not present

## 2019-03-28 DIAGNOSIS — Z794 Long term (current) use of insulin: Secondary | ICD-10-CM | POA: Diagnosis not present

## 2019-03-28 DIAGNOSIS — N183 Chronic kidney disease, stage 3 unspecified: Secondary | ICD-10-CM | POA: Diagnosis not present

## 2019-03-28 DIAGNOSIS — R809 Proteinuria, unspecified: Secondary | ICD-10-CM | POA: Diagnosis not present

## 2019-03-28 DIAGNOSIS — E1159 Type 2 diabetes mellitus with other circulatory complications: Secondary | ICD-10-CM | POA: Diagnosis not present

## 2019-03-28 DIAGNOSIS — E1129 Type 2 diabetes mellitus with other diabetic kidney complication: Secondary | ICD-10-CM | POA: Diagnosis not present

## 2019-03-28 DIAGNOSIS — E1142 Type 2 diabetes mellitus with diabetic polyneuropathy: Secondary | ICD-10-CM | POA: Diagnosis not present

## 2019-03-28 DIAGNOSIS — M810 Age-related osteoporosis without current pathological fracture: Secondary | ICD-10-CM | POA: Diagnosis not present

## 2019-03-28 LAB — HEMOGLOBIN A1C: Hemoglobin A1C: 11.4

## 2019-03-29 ENCOUNTER — Ambulatory Visit: Payer: Medicare Other | Admitting: Psychiatry

## 2019-03-30 ENCOUNTER — Ambulatory Visit: Payer: Medicare Other | Admitting: Psychiatry

## 2019-04-07 IMAGING — CT CT HEAD W/O CM
3 series · 15 of 47 positions shown, 18 images · non-contrast
Comparison: 09/19/2017; 01/29/2015; brain MRI-01/29/2015

CLINICAL DATA: Concern for TIA. Slurred speech. Increased weakness
and confusion. History of stroke.

EXAM:
CT HEAD WITHOUT CONTRAST
TECHNIQUE: Contiguous axial images were obtained from the base of the skull
through the vertex without intravenous contrast.

[Series 2: head wo · axial · 0.47mm/px · z∈[-92,+38]mm · 9 of 32 slices shown, 12 images]
[im 3/32  brain]
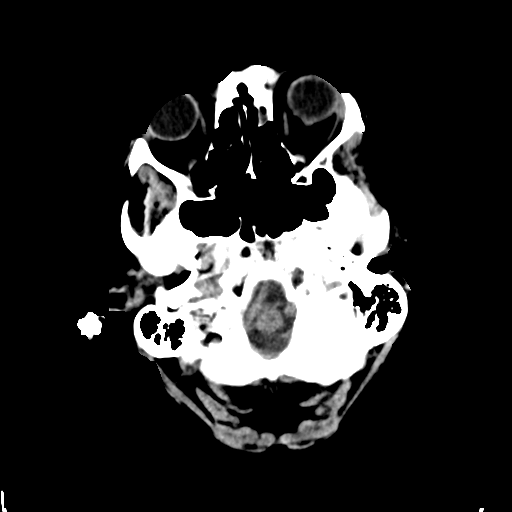
[im 3/32  bone]
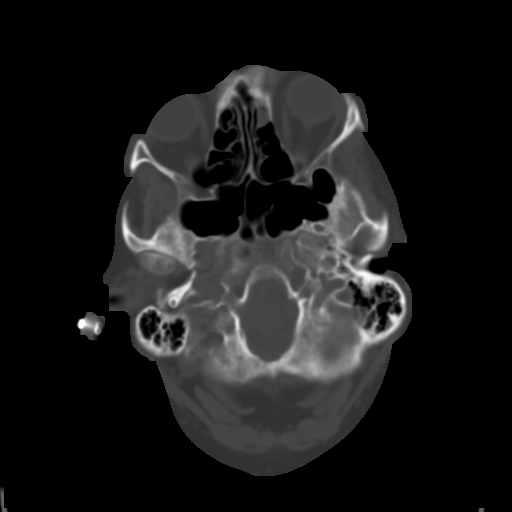
[im 6/32  brain]
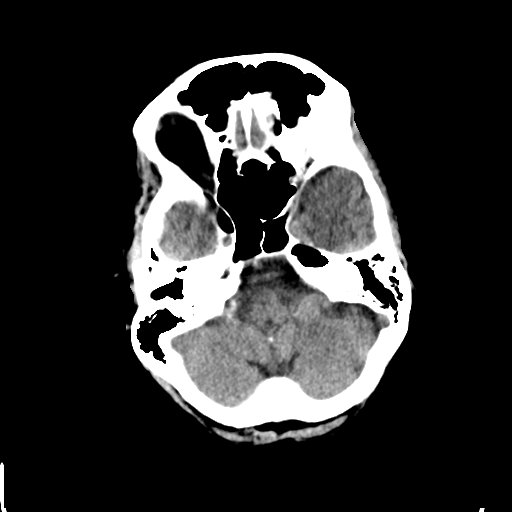
[im 9/32  brain]
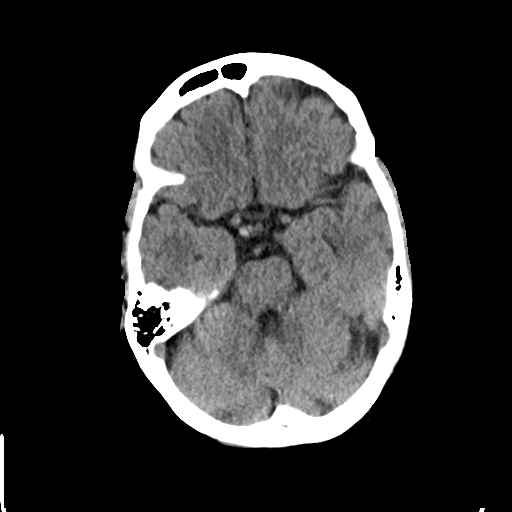
[im 12/32  brain]
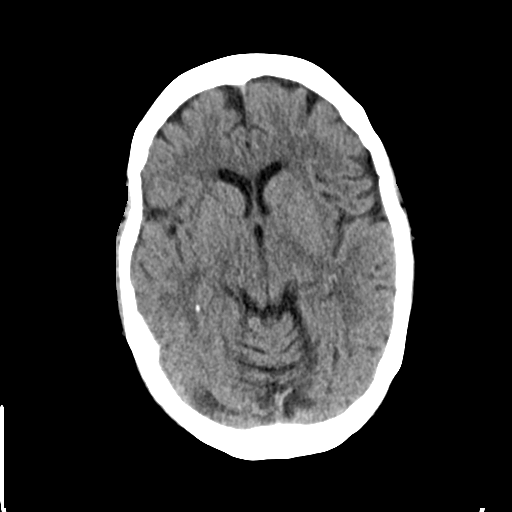
[im 17/32  brain]
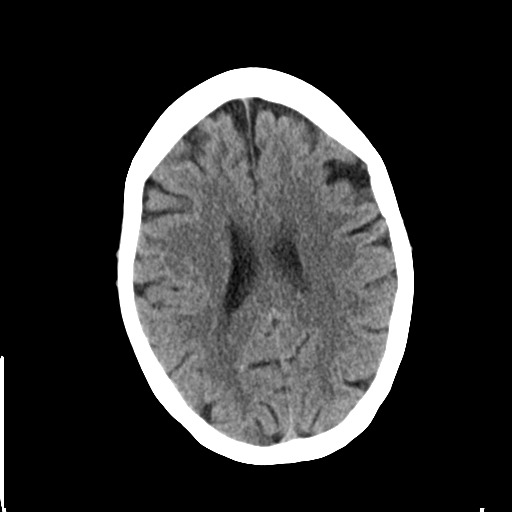
[im 17/32  bone]
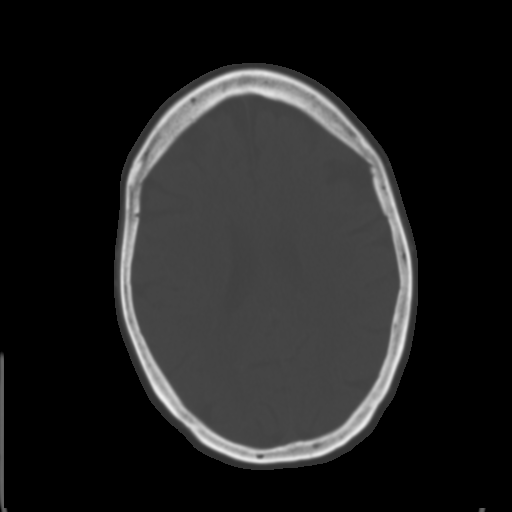
[im 20/32  brain]
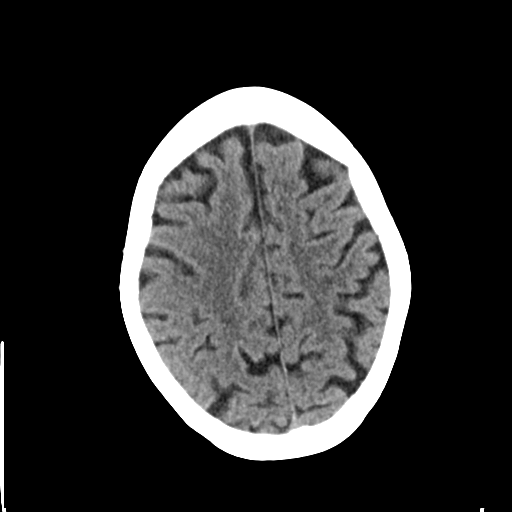
[im 23/32  brain]
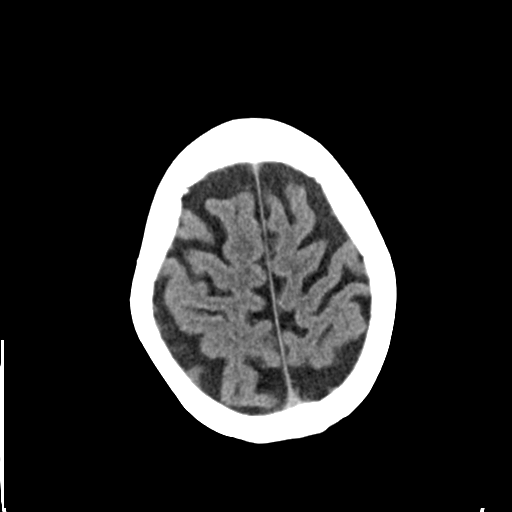
[im 26/32  brain]
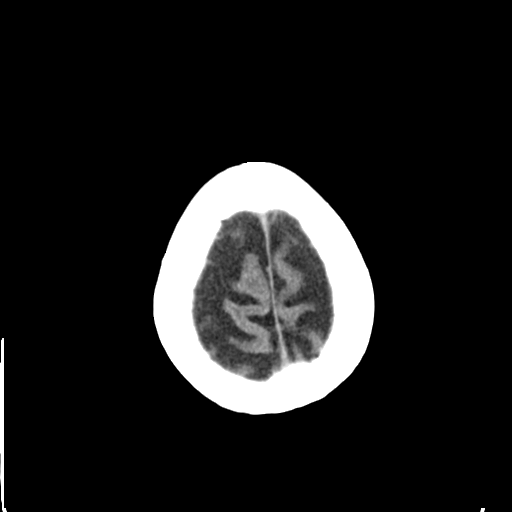
[im 29/32  brain]
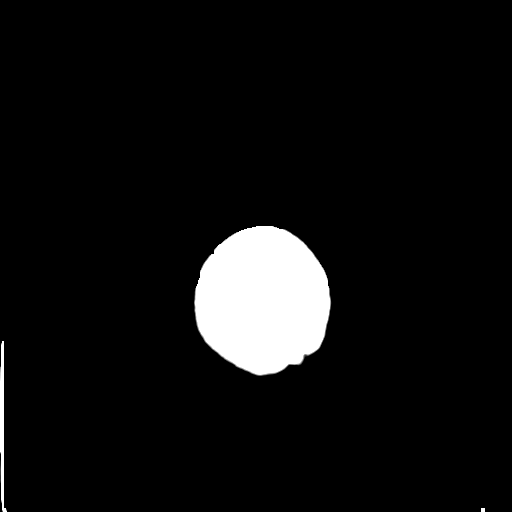
[im 29/32  bone]
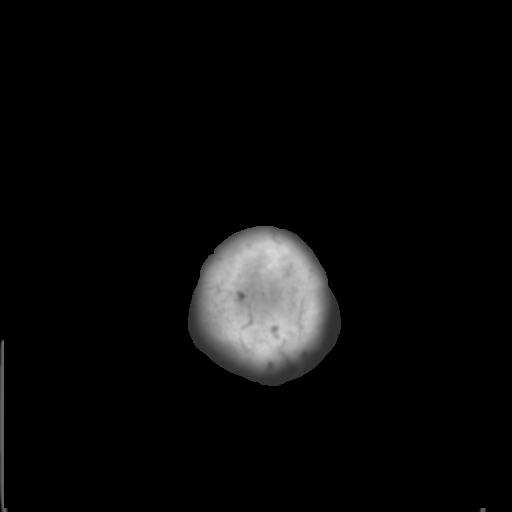

[Series 4: coronal soft tissue · coronal · 0.31mm/px · 3 of 64 slices shown]
[im 22/64  brain]
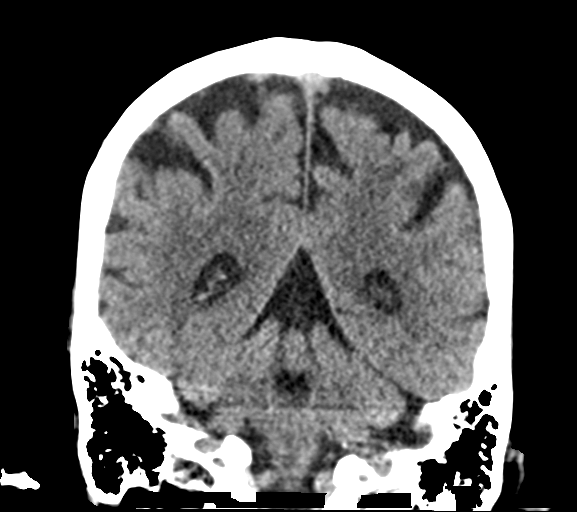
[im 29/64  brain]
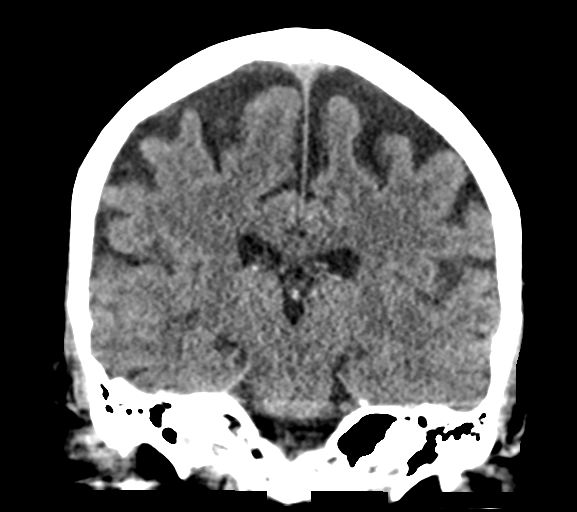
[im 36/64  brain]
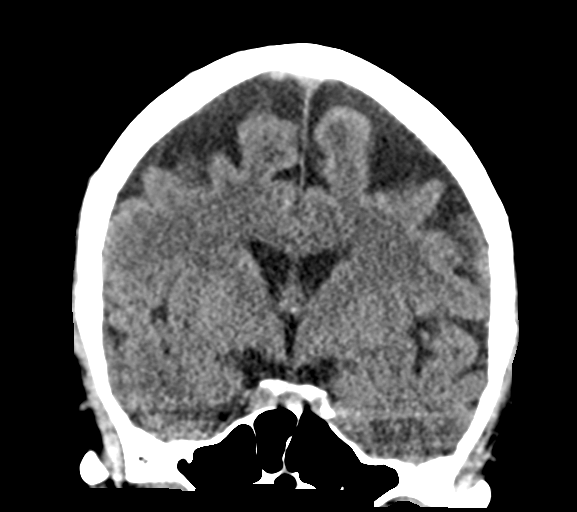

[Series 5: sagittal soft tissue · sagittal · 0.31mm/px · 3 of 50 slices shown]
[im 17/50  brain]
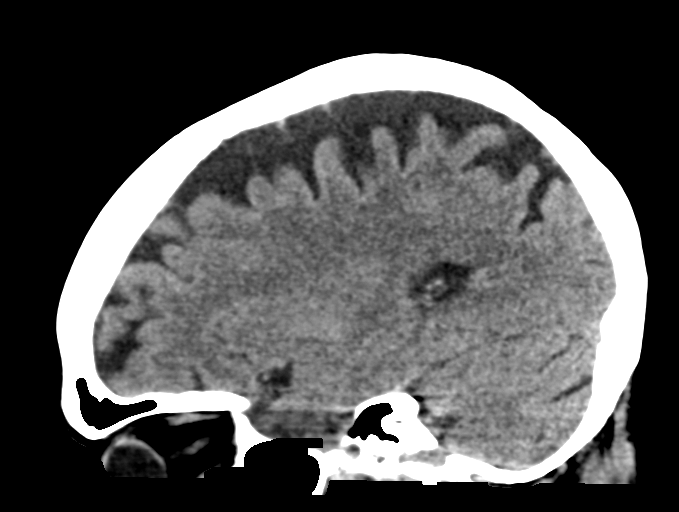
[im 25/50  brain]
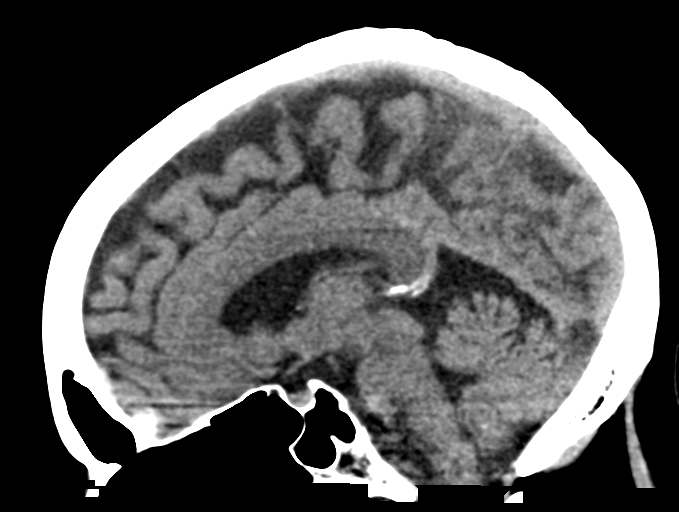
[im 33/50  brain]
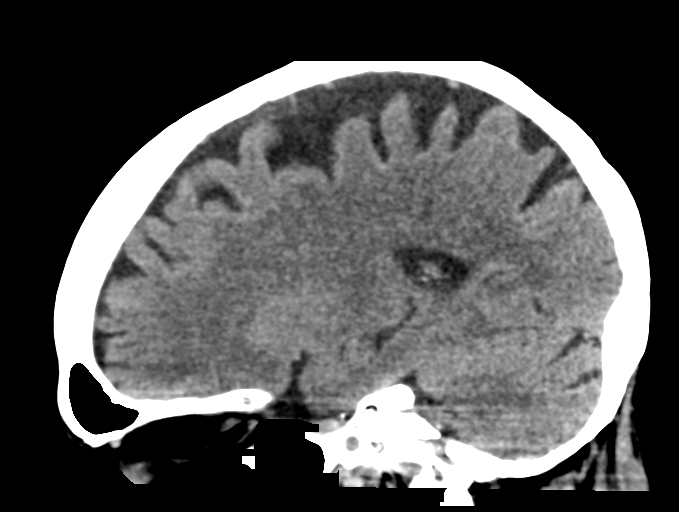

[15 of 47 positions shown; findings below may reference images not displayed]

FINDINGS: Brain: Similar findings of atrophy with sulcal prominence. Minimal
periventricular hypodensities compatible with microvascular ischemic
disease. No CT evidence of acute large territory infarct. No
intraparenchymal or extra-axial mass or hemorrhage. Unchanged size
and configuration of the ventricles and the basilar cisterns. No
midline shift.

Vascular: No hyperdense vessel or unexpected calcification.

Skull: No displaced calvarial fracture.

Sinuses/Orbits: Limited visualization of the paranasal sinuses and
mastoid air cells is normal. No air-fluid levels.

Other: Regional soft tissues appear normal.
IMPRESSION: Similar findings of atrophy and mild microvascular ischemic disease
without superimposed acute intracranial process.

## 2019-04-09 ENCOUNTER — Ambulatory Visit (INDEPENDENT_AMBULATORY_CARE_PROVIDER_SITE_OTHER): Payer: Medicare Other | Admitting: Psychiatry

## 2019-04-09 ENCOUNTER — Encounter: Payer: Self-pay | Admitting: Psychiatry

## 2019-04-09 ENCOUNTER — Other Ambulatory Visit: Payer: Self-pay

## 2019-04-09 DIAGNOSIS — F3342 Major depressive disorder, recurrent, in full remission: Secondary | ICD-10-CM

## 2019-04-09 NOTE — Progress Notes (Signed)
[place] MD OP Progress Note  Virtual Visit via Telephone Note  I connected with Jocelyn Wells on 04/09/19 at  1:30 PM EST by telephone and verified that I am speaking with the correct person using two identifiers.  I discussed the limitations, risks, security and privacy concerns of performing an evaluation and management service by telephone and the availability of in person appointments. I also discussed with the patient that there may be a patient responsible charge related to this service. The patient expressed understanding and agreed to proceed.    04/09/2019 1:38 PM Jocelyn Wells  MRN:  [id number]  Chief Complaint: " I am doing fine."  HPI: Patient reported that she has been doing well.  She informed that she takes Paxil 40 mg in the evening.  She stated that her mood has been stable for the most part.  She said her sleep is not as good on some nights however she thinks it is manageable.  She denied any acute issues or concerns pertaining to her mood at this time.  She stated that she already is on several different medications for medical conditions so does not want to add any more pills for sleeping.  Visit Diagnosis:    ICD-10-CM   1. MDD (major depressive disorder), recurrent, in full remission ([place])  F33.42     Past Psychiatric History: MDD  Past Medical History:  Past Medical History:  Diagnosis Date  . Anxiety   . Cardiomyopathy [facility])    new to her Jan 2017  . COPD (chronic obstructive pulmonary disease) ([place])   . Depression   . Diabetes mellitus, type II ([place])   . HTN (hypertension)   . Obesity   . Osteoporosis   . PONV (postoperative nausea and vomiting)   . Stroke [facility])    Jan 2017    Past Surgical History:  Procedure Laterality Date  . ABDOMINAL HYSTERECTOMY    . ANKLE FRACTURE SURGERY Left 2002  . BILATERAL SALPINGOOPHORECTOMY  2000  . CARDIAC CATHETERIZATION N/A 03/25/2015   Procedure: Right/Left Heart Cath and Coronary Angiography;  Surgeon: Belva Crome,  MD;  Location: [place] CV LAB;  Service: Cardiovascular;  Laterality: N/A;  . [place]  . COLONOSCOPY WITH PROPOFOL N/A 09/22/2016   Procedure: COLONOSCOPY WITH PROPOFOL;  Surgeon: Manya Silvas, MD;  Location: [facility] ENDOSCOPY;  Service: Endoscopy;  Laterality: N/A;  . EP IMPLANTABLE DEVICE N/A 08/05/2015   Procedure: Loop Recorder Insertion;  Surgeon: Deboraha Sprang, MD;  Location: [place] CV LAB;  Service: Cardiovascular;  Laterality: N/A;  . ESOPHAGOGASTRODUODENOSCOPY (EGD) WITH PROPOFOL N/A 09/22/2016   Procedure: ESOPHAGOGASTRODUODENOSCOPY (EGD) WITH PROPOFOL;  Surgeon: Manya Silvas, MD;  Location: [facility] ENDOSCOPY;  Service: Endoscopy;  Laterality: N/A;  . ESOPHAGOGASTRODUODENOSCOPY (EGD) WITH PROPOFOL N/A 12/08/2016   Procedure: ESOPHAGOGASTRODUODENOSCOPY (EGD) WITH PROPOFOL;  Surgeon: Manya Silvas, MD;  Location: [facility] ENDOSCOPY;  Service: Endoscopy;  Laterality: N/A;  . KNEE ARTHROSCOPY Left 2005  . TEE WITHOUT CARDIOVERSION N/A 01/31/2015   Procedure: TRANSESOPHAGEAL ECHOCARDIOGRAM (TEE);  Surgeon: Lelon Perla, MD;  Location: [place];  Service: Cardiovascular;  Laterality: N/A;  . [place]  . ULNAR NERVE TRANSPOSITION  2008    Family Psychiatric History: see below  Family History:  Family History  Problem Relation Age of Onset  . Heart disease Mother        died from CHF  . Asthma Mother   . Diabetes Mother   . Heart  disease Father   . Aneurysm Father   . COPD Brother   . Diabetes Brother   . Alcohol abuse Paternal Aunt   . Anemia Neg Hx   . Arrhythmia Neg Hx   . Clotting disorder Neg Hx   . Fainting Neg Hx   . Heart attack Neg Hx   . Heart failure Neg Hx   . Hyperlipidemia Neg Hx   . Hypertension Neg Hx     Social History:  Social History   Socioeconomic History  . Marital status: Married    Spouse name: Not on file  . Number of children: 2  . Years of education: Not on file  . Highest education level:  Bachelor's degree (e.g., BA, AB, BS)  Occupational History  . Occupation: disabled    Comment: retired  Tobacco Use  . Smoking status: Current Every Day Smoker    Packs/day: 1.00    Years: 36.00    Pack years: 36.00    Types: Cigarettes    Start date: 02/08/1974  . Smokeless tobacco: Never Used  . Tobacco comment: "I can not quit smoking, I have tried". 1 PPD  Substance and Sexual Activity  . Alcohol use: No    Alcohol/week: 0.0 standard drinks  . Drug use: No  . Sexual activity: Yes    Birth control/protection: None  Other Topics Concern  . Not on file  Social History Narrative   Lives at home with stepson and husband, dogs and cats   Caffeine  Drinks sweet tea.   Right handed.    Social Determinants of Health   Financial Resource Strain:   . Difficulty of Paying Living Expenses: Not on file  Food Insecurity:   . Worried About Charity fundraiser in the Last Year: Not on file  . Ran Out of Food in the Last Year: Not on file  Transportation Needs:   . Lack of Transportation (Medical): Not on file  . Lack of Transportation (Non-Medical): Not on file  Physical Activity:   . Days of Exercise per Week: Not on file  . Minutes of Exercise per Session: Not on file  Stress: No Stress Concern Present  . Feeling of Stress : Not at all  Social Connections: Unknown  . Frequency of Communication with Friends and Family: Patient refused  . Frequency of Social Gatherings with Friends and Family: Patient refused  . Attends Religious Services: Patient refused  . Active Member of Clubs or Organizations: Patient refused  . Attends Archivist Meetings: Patient refused  . Marital Status: Patient refused    Allergies:  Allergies  Allergen Reactions  . Codeine Nausea And Vomiting    Metabolic Disorder Labs: Lab Results  Component Value Date   HGBA1C 12.2 (H) 10/06/2017   MPG 303.44 10/06/2017   MPG 194 01/30/2015   No results found for: PROLACTIN Lab Results   Component Value Date   CHOL 190 10/06/2017   TRIG 322 (H) 10/06/2017   HDL 38 (L) 10/06/2017   CHOLHDL 5.0 10/06/2017   VLDL 64 (H) 10/06/2017   LDLCALC 88 10/06/2017   LDLCALC 89 01/30/2015   No results found for: TSH  Therapeutic Level Labs: No results found for: LITHIUM No results found for: VALPROATE No components found for:  CBMZ  Current Medications: Current Outpatient Medications  Medication Sig Dispense Refill  . atorvastatin (LIPITOR) 40 MG tablet TAKE 1 TABLET BY MOUTH EVERY DAY 30 tablet 0  . B-D INS SYRINGE 0.5CC/31GX5/16 31G X 5/16"  0.5 ML MISC     . BYDUREON 2 MG PEN Inject 2 mg into the skin once a week. SATURDAY    . calcipotriene (DOVONOX) 0.005 % cream Apply topically as needed. For psoriasis flares    . carvedilol (COREG) 12.5 MG tablet TAKE 1 TABLET (12.5 MG TOTAL) BY MOUTH 2 (TWO) TIMES DAILY. 60 tablet 11  . denosumab (PROLIA) 60 MG/ML SOSY injection Inject 60 mg into the skin every 6 (six) months.    . gabapentin (NEURONTIN) 300 MG capsule Take 1 capsule (300 mg total) by mouth daily. 90 capsule 3  . glucose blood test strip OneTouch Verio strips    . glyBURIDE-metformin (GLUCOVANCE) 5-500 MG per tablet Take 2 tablets by mouth 2 (two) times daily.     . hydrochlorothiazide (HYDRODIURIL) 25 MG tablet Take 25 mg by mouth daily.    Marland Kitchen loratadine (CLARITIN) 10 MG tablet Take 1 tablet by mouth daily.    Marland Kitchen losartan (COZAAR) 25 MG tablet TAKE 2 TABLETS BY MOUTH EVERY DAY 180 tablet 1  . mupirocin ointment (BACTROBAN) 2 % mupirocin 2 % topical ointment    . NOVOLIN 70/30 RELION (70-30) 100 UNIT/ML injection Inject 10-50 Units into the skin 2 (two) times daily with a meal. 10units-AM,  40-50units-PM    . OZEMPIC, 1 MG/DOSE, 2 MG/1.5ML SOPN     . PARoxetine (PAXIL) 40 MG tablet TAKE 1 TABLET BY MOUTH EVERY DAY 90 tablet 1  . Semaglutide, 1 MG/DOSE, 2 MG/1.5ML SOPN Inject into the skin.    Marland Kitchen traMADol (ULTRAM) 50 MG tablet Take 50 mg by mouth every 6 (six) hours as  needed.     . triamcinolone cream (KENALOG) 0.1 % Apply 1 application topically as needed (for psoriasis flares).     . Vitamin D, Ergocalciferol, (DRISDOL) 1.25 MG (50000 UT) CAPS capsule Take 50,000 Units by mouth once a week.     No current facility-administered medications for this visit.    Musculoskeletal: Strength & Muscle Tone: unable to assess due to telemed visit Gait & Station: unable to assess due to telemed visit Patient leans: unable to assess due to telemed visit   Psychiatric Specialty Exam: Review of Systems  There were no vitals taken for this visit.There is no height or weight on file to calculate BMI.  General Appearance: unable to assess due to phone visit  Eye Contact:  unable to assess due to phone visit  Speech:  Clear and Coherent and Normal Rate  Volume:  Normal  Mood:  Euthymic  Affect:  Congruent  Thought Process:  Goal Directed, Linear and Descriptions of Associations: Intact  Orientation:  Full (Time, Place, and Person)  Thought Content: Logical   Suicidal Thoughts:  No  Homicidal Thoughts:  No  Memory:  Recent;   Good Remote;   Good  Judgement:  Fair  Insight:  Fair  Psychomotor Activity:  Normal  Concentration:  Concentration: Good and Attention Span: Good  Recall:  Good  Fund of Knowledge: Good  Language: Good  Akathisia:  Negative  Handed:  Right  AIMS (if indicated): not done  Assets:  Communication Skills Desire for Improvement Financial Resources/Insurance Housing  ADL's:  Intact  Cognition: WNL  Sleep:  Fair     Screenings: PHQ2-9     Clinical Support from 04/10/2018 in [place] from 12/26/2017 in [place] Office Visit from 11/15/2017 in [place] from 03/17/2017 in [place] from 03/11/2016 in  [place]  PHQ-2 Total Score  2  0  0  2  1  PHQ-9 Total Score  6  --  --  9  --       Assessment and  Plan: This is a 64 year old female with history of MDD and other medical comorbidities.  Patient seems to be stable on her current regimen.  1. MDD (major depressive disorder), recurrent, in full remission ([place]) - Continue Paxil 40 mg daily (Refills sent a few weeks ago.)  F/up in 3 months.    [place] Crane, MD 04/09/2019, 1:38 PM

## 2019-04-17 ENCOUNTER — Encounter: Payer: Self-pay | Admitting: Internal Medicine

## 2019-04-17 ENCOUNTER — Ambulatory Visit (INDEPENDENT_AMBULATORY_CARE_PROVIDER_SITE_OTHER): Payer: Medicare Other | Admitting: Internal Medicine

## 2019-04-17 ENCOUNTER — Other Ambulatory Visit: Payer: Self-pay

## 2019-04-17 VITALS — BP 120/86 | HR 82 | Ht 66.0 in | Wt 221.5 lb

## 2019-04-17 DIAGNOSIS — I639 Cerebral infarction, unspecified: Secondary | ICD-10-CM | POA: Diagnosis not present

## 2019-04-17 DIAGNOSIS — I428 Other cardiomyopathies: Secondary | ICD-10-CM

## 2019-04-17 DIAGNOSIS — Z959 Presence of cardiac and vascular implant and graft, unspecified: Secondary | ICD-10-CM

## 2019-04-17 DIAGNOSIS — G894 Chronic pain syndrome: Secondary | ICD-10-CM | POA: Diagnosis not present

## 2019-04-17 LAB — CUP PACEART INCLINIC DEVICE CHECK
Date Time Interrogation Session: 20210309115549
Implantable Pulse Generator Implant Date: 20170627

## 2019-04-17 MED ORDER — ASPIRIN EC 81 MG PO TBEC: 81.0000 mg | DELAYED_RELEASE_TABLET | Freq: Every day | ORAL | Status: DC

## 2019-04-17 NOTE — Progress Notes (Signed)
Patient Care Team: Chrism[place], Vickki Muff, PA as PCP - General (Physician Assistant) Gabriel Carina Betsey Holiday, MD as Physician Assistant (Endocrinology) Deboraha Sprang, MD as Consulting Physician (Cardiology) Rainey Pines, MD as Referring Physician (Psychiatry) Ralene Bathe, MD as Consulting Physician (Dermatology) Manya Silvas, MD (Inactive) as Consulting Physician (Gastroenterology) Dimmig, Marcello Moores, MD as Referring Physician (Orthopedic Surgery) Schnier, Dolores Lory, MD (Vascular Surgery) Lynnell Dike, OD (Optometry) Jannifer [place], NP as Nurse Practitioner (Neurology)   HPI  Jocelyn Wells is a 64 y.o. female Seen in follow-up for [facility] loop recorder implantation (DOI- 1/17 )for cryptogenic stroke in the setting of a nonischemic cardiomyopathy  No intercurrent atrial fibrillation or flutter  dyspnea and edema; no chest pain   DATE TEST EF   2/17 Cath 20-25% CA nl  5/17 Echo   40-45 %   8/19 Echo  60-65%    Date Cr K Hgb  4/17 1.68 4.7 12.2  1/19 1.4 4.1    2/21 1.7 4.4       Records and Results Reviewed notes from endocrine/internal medicine  Past Medical History:  Diagnosis Date  . Anxiety   . Cardiomyopathy [facility])    new to her Jan 2017  . COPD (chronic obstructive pulmonary disease) ([place])   . Depression   . Diabetes mellitus, type II ([place])   . HTN (hypertension)   . Obesity   . Osteoporosis   . PONV (postoperative nausea and vomiting)   . Stroke [facility])    Jan 2017    Past Surgical History:  Procedure Laterality Date  . ABDOMINAL HYSTERECTOMY    . ANKLE FRACTURE SURGERY Left 2002  . BILATERAL SALPINGOOPHORECTOMY  2000  . CARDIAC CATHETERIZATION N/A 03/25/2015   Procedure: Right/Left Heart Cath and Coronary Angiography;  Surgeon: Belva Crome, MD;  Location: [place] CV LAB;  Service: Cardiovascular;  Laterality: N/A;  . [place]  . COLONOSCOPY WITH PROPOFOL N/A 09/22/2016   Procedure: COLONOSCOPY WITH PROPOFOL;  Surgeon: Manya Silvas, MD;  Location[facility] ENDOSCOPY;  Service: Endoscopy;  Laterality: N/A;  . EP IMPLANTABLE DEVICE N/A 08/05/2015   Procedure: Loop Recorder Insertion;  Surgeon: Deboraha Sprang, MD;  Location: [place] CV LAB;  Service: Cardiovascular;  Laterality: N/A;  . ESOPHAGOGASTRODUODENOSCOPY (EGD) WITH PROPOFOL N/A 09/22/2016   Procedure: ESOPHAGOGASTRODUODENOSCOPY (EGD) WITH PROPOFOL;  Surgeon: Manya Silvas, MD;  Location: [facility] ENDOSCOPY;  Service: Endoscopy;  Laterality: N/A;  . ESOPHAGOGASTRODUODENOSCOPY (EGD) WITH PROPOFOL N/A 12/08/2016   Procedure: ESOPHAGOGASTRODUODENOSCOPY (EGD) WITH PROPOFOL;  Surgeon: Manya Silvas, MD;  Location: [facility] ENDOSCOPY;  Service: Endoscopy;  Laterality: N/A;  . KNEE ARTHROSCOPY Left 2005  . TEE WITHOUT CARDIOVERSION N/A 01/31/2015   Procedure: TRANSESOPHAGEAL ECHOCARDIOGRAM (TEE);  Surgeon: Lelon Perla, MD;  Location: [place];  Service: Cardiovascular;  Laterality: N/A;  . [place]  . ULNAR NERVE TRANSPOSITION  2008    Current Outpatient Medications  Medication Sig Dispense Refill  . atorvastatin (LIPITOR) 40 MG tablet TAKE 1 TABLET BY MOUTH EVERY DAY 30 tablet 0  . B-D INS SYRINGE 0.5CC/31GX5/16 31G X 5/16" 0.5 ML MISC     . calcipotriene (DOVONOX) 0.005 % cream Apply topically as needed. For psoriasis flares    . carvedilol (COREG) 12.5 MG tablet TAKE 1 TABLET (12.5 MG TOTAL) BY MOUTH 2 (TWO) TIMES DAILY. 60 tablet 11  . denosumab (PROLIA) 60 MG/ML SOSY injection Inject 60 mg into the skin every  6 (six) months.    . gabapentin (NEURONTIN) 300 MG capsule Take 1 capsule (300 mg total) by mouth daily. 90 capsule 3  . glucose blood test strip OneTouch Verio strips    . glyBURIDE-metformin (GLUCOVANCE) 5-500 MG per tablet Take 2 tablets by mouth 2 (two) times daily.     . hydrochlorothiazide (HYDRODIURIL) 25 MG tablet Take 25 mg by mouth daily.    Marland Kitchen loratadine (CLARITIN) 10 MG tablet Take 1 tablet by mouth daily.    Marland Kitchen losartan  (COZAAR) 25 MG tablet TAKE 2 TABLETS BY MOUTH EVERY DAY 180 tablet 1  . mupirocin ointment (BACTROBAN) 2 % mupirocin 2 % topical ointment    . NOVOLIN 70/30 RELION (70-30) 100 UNIT/ML injection Inject 10-50 Units into the skin 2 (two) times daily with a meal. 10units-AM,  40-50units-PM    . OZEMPIC, 1 MG/DOSE, 2 MG/1.5ML SOPN     . PARoxetine (PAXIL) 40 MG tablet TAKE 1 TABLET BY MOUTH EVERY DAY 90 tablet 1  . Semaglutide, 1 MG/DOSE, 2 MG/1.5ML SOPN Inject into the skin.    Marland Kitchen traMADol (ULTRAM) 50 MG tablet Take 50 mg by mouth every 6 (six) hours as needed.     . triamcinolone cream (KENALOG) 0.1 % Apply 1 application topically as needed (for psoriasis flares).     . Vitamin D, Ergocalciferol, (DRISDOL) 1.25 MG (50000 UT) CAPS capsule Take 50,000 Units by mouth once a week.     No current facility-administered medications for this visit.    Allergies  Allergen Reactions  . Codeine Nausea And Vomiting      Review of Systems negative except from HPI and PMH  Physical Exam BP 120/86 (BP Location: Left Arm, Patient Position: Sitting, Cuff Size: Normal)   Pulse 82   Ht 5\' 6"  (1.676 m)   Wt 221 lb 8 oz (100.5 kg)   SpO2 96%   BMI 35.75 kg/m  Well developed and nourished in no acute distress HENT normal Neck supple with JVP-flat Clear Regular rate and rhythm, no murmurs or gallops Abd-soft with active BS No Clubbing cyanosis edema Skin-warm and dry A & Oriented  Grossly normal sensory and motor function   Sinus at 82 intervals 19/14/42 Axis left -77  Rght bundle branch block Left axis deviation  Assessment and  Plan  Nonischemic cardiomyopathy  Sleep disordered breathing  Cryptogenic stroke  Grade 3 renal insufficiency  DVT  Unprovoked  Bifascicular block chronic   The patient has had no interval atrial fibrillation.  She should be on aspirin for her cryptogenic stroke; however, in the context of her history of an unprovoked DVT I am wondering whether the  better long-term strategy is not anticoagulation.  I will talk about this with some colleagues.  Interval normalization of LV systolic function.  We will continue carvedilol and losartan.

## 2019-04-17 NOTE — Patient Instructions (Signed)
Medication Instructions:  Your physician has recommended you make the following change in your medication:  1. START: Aspirin 81 mg take one tablet by mouth daily. *If you need a refill on your cardiac medications before your next appointment, please call your pharmacy*   Lab Work: None If you have labs (blood work) drawn today and your tests are completely normal, you will receive your results only by: Marland Kitchen MyChart Message (if you have MyChart) OR . A paper copy in the mail If you have any lab test that is abnormal or we need to change your treatment, we will call you to review the results.   Testing/Procedures: None   Follow-Up: At [facility], you and your health needs are our priority.  As part of our continuing mission to provide you with exceptional heart care, we have created designated Provider Care Teams.  These Care Teams include your primary Cardiologist (physician) and Advanced Practice Providers (APPs -  Physician Assistants and Nurse Practitioners) who all work together to provide you with the care you need, when you need it.  We recommend signing up for the patient portal called "MyChart".  Sign up information is provided on this After Visit Summary.  MyChart is used to connect with patients for Virtual Visits (Telemedicine).  Patients are able to view lab/test results, encounter notes, upcoming appointments, etc.  Non-urgent messages can be sent to your provider as well.   To learn more about what you can do with MyChart, go to [email or web address].    Your next appointment:   As needed   The format for your next appointment:   In Person  Provider:   Virl Axe, MD   Other Instructions We will call you in regards to if Dr Caryl Comes feels the need for you to be started on a blood thinner.

## 2019-04-19 ENCOUNTER — Other Ambulatory Visit: Payer: Self-pay | Admitting: Family Medicine

## 2019-04-21 LAB — COMPLIANCE DRUG ANALYSIS, UR

## 2019-04-23 ENCOUNTER — Encounter: Payer: Self-pay | Admitting: Family Medicine

## 2019-04-23 ENCOUNTER — Other Ambulatory Visit: Payer: Self-pay

## 2019-04-23 ENCOUNTER — Ambulatory Visit (INDEPENDENT_AMBULATORY_CARE_PROVIDER_SITE_OTHER): Payer: Medicare Other | Admitting: Family Medicine

## 2019-04-23 VITALS — BP 158/82 | HR 91 | Temp 96.5°F | Wt 223.0 lb

## 2019-04-23 DIAGNOSIS — I69398 Other sequelae of cerebral infarction: Secondary | ICD-10-CM | POA: Diagnosis not present

## 2019-04-23 DIAGNOSIS — S32010S Wedge compression fracture of first lumbar vertebra, sequela: Secondary | ICD-10-CM | POA: Diagnosis not present

## 2019-04-23 DIAGNOSIS — I10 Essential (primary) hypertension: Secondary | ICD-10-CM | POA: Diagnosis not present

## 2019-04-23 DIAGNOSIS — M810 Age-related osteoporosis without current pathological fracture: Secondary | ICD-10-CM

## 2019-04-23 DIAGNOSIS — R2689 Other abnormalities of gait and mobility: Secondary | ICD-10-CM | POA: Diagnosis not present

## 2019-04-23 DIAGNOSIS — Z794 Long term (current) use of insulin: Secondary | ICD-10-CM

## 2019-04-23 DIAGNOSIS — E1143 Type 2 diabetes mellitus with diabetic autonomic (poly)neuropathy: Secondary | ICD-10-CM | POA: Diagnosis not present

## 2019-04-23 DIAGNOSIS — I639 Cerebral infarction, unspecified: Secondary | ICD-10-CM

## 2019-04-23 DIAGNOSIS — E785 Hyperlipidemia, unspecified: Secondary | ICD-10-CM | POA: Diagnosis not present

## 2019-04-23 MED ORDER — ATORVASTATIN CALCIUM 40 MG PO TABS: 40.0000 mg | ORAL_TABLET | Freq: Every day | ORAL | 3 refills | Status: DC

## 2019-04-23 MED ORDER — LOSARTAN POTASSIUM 25 MG PO TABS: 50.0000 mg | ORAL_TABLET | Freq: Every day | ORAL | 3 refills | Status: DC

## 2019-04-23 NOTE — Progress Notes (Signed)
Jocelyn Wells  MRN: [id number] DOB: [birth year]  Subjective:  HPI   The patient is a 64 year old female who presents for refills on her medications.  The patient has not been seen since 11/2017. She does have specialists that follow her and maintain their appropriate medications.     Hypertension, follow-up:  BP Readings from Last 3 Encounters:  04/23/19 (!) 158/82  04/17/19 120/86  08/07/18 132/79    She was last seen for hypertension 17 months ago.  BP at that visit was 142/70  Management since that visit includes no changes. She reports good compliance with treatment. She is not having side effects.    Weight trend: stable Wt Readings from Last 3 Encounters:  04/23/19 223 lb (101.2 kg)  04/17/19 221 lb 8 oz (100.5 kg)  01/29/19 223 lb (101.2 kg)     Lipid/Cholesterol, Follow-up:   Last seen for this17 months ago.  Management changes since that visit include no changes. . Last Lipid Panel:    Component Value Date/Time   CHOL 190 10/06/2017 0330   TRIG 322 (H) 10/06/2017 0330   HDL 38 (L) 10/06/2017 0330   CHOLHDL 5.0 10/06/2017 0330   VLDL 64 (H) 10/06/2017 0330   LDLCALC 88 10/06/2017 0330    Risk factors for vascular disease include diabetes mellitus, hypertension and smoking  She reports good compliance with treatment. She is not having side effects.   Wt Readings from Last 3 Encounters:  04/23/19 223 lb (101.2 kg)  04/17/19 221 lb 8 oz (100.5 kg)  01/29/19 223 lb (101.2 kg)    Patient Active Problem List   Diagnosis Date Noted  . MDD (major depressive disorder), recurrent, in full remission ([place]) 04/09/2019  . Compression fracture of L1 lumbar vertebra ([place]) 01/30/2019  . Compression fracture of body of thoracic vertebra ([place]) 01/30/2019  . Lumbar foraminal stenosis (RIGHT L1) 01/30/2019  . Spinal stenosis of lumbar region with neurogenic claudication (L4-L5) 01/30/2019  . DVT (deep venous thrombosis) ([place]) 02/09/2018  . CVA (cerebral  vascular accident) ([place]) 10/05/2017  . Depression (emotion) 06/12/2015  . Cerebrovascular accident (CVA) due to bilateral thrombosis of carotid arteries ([place]) 06/12/2015  . Excessive daytime sleepiness 06/12/2015  . Nicotine dependence 04/24/2015  . Snoring 04/24/2015  . Sleep paralysis, recurrent isolated 04/24/2015  . Hypersomnia with sleep apnea 04/24/2015  . Cataplexy 04/24/2015  . Morbid obesity due to excess calories ([place]) 04/24/2015  . COPD exacerbation ([place]) 04/24/2015  . Cerebrovascular accident (CVA) due to thrombosis of left middle cerebral artery ([place]) 04/24/2015  . Embolic stroke ([place]) 65/46/5035  . Fatigue 04/23/2015  . Type 2 diabetes mellitus ([place]) 04/07/2015  . Chronic systolic heart failure ([place]) 03/25/2015  . Stroke ([place]) 01/31/2015  . Nausea & vomiting 01/29/2015  . AKI (acute kidney injury) ([place]) 01/29/2015  . CVA (cerebral infarction) 01/29/2015  . Stroke (cerebrum) ([place]) 01/29/2015  . Dizziness and giddiness 07/05/2014  . Blood pressure elevated 07/05/2014  . Carbuncle and furuncle 07/05/2014  . BP (high blood pressure) 07/05/2014  . Does not feel right 07/05/2014  . Pain of perianal area 07/05/2014  . Guttate psoriasis 07/05/2014  . Gonalgia 07/05/2014  . H/O diabetes mellitus 06/05/2014  . CAFL (chronic airflow limitation) ([place]) 06/05/2014  . H/O visual disturbance 06/05/2014  . H/O elevated lipids 06/05/2014  . H/O: HTN (hypertension) 06/05/2014  . H/O: osteoarthritis 06/05/2014  . Initial insomnia 06/05/2014  . Anxiety, generalized 06/05/2014  . Depression, major, recurrent, moderate ([place]) 06/05/2014  . Microalbuminuria  12/23/2013  . Long term current use of insulin ([place]) 12/23/2013  . Compulsive tobacco user syndrome 12/23/2013  . Type 2 diabetes mellitus with other diabetic kidney complication ([place]) 16/11/9602  . Type II diabetes mellitus with renal manifestations, uncontrolled ([place]) 12/23/2013  . Contusion of cheek 03/31/2009  . Change in  blood platelet count 06/08/2007  . Blood in feces 06/01/2007  . D (diarrhea) 06/01/2007  . Disturbance of skin sensation 09/02/2006  . Difficulty hearing 07/25/2006  . Acute onset aura migraine 06/27/2006  . Cephalalgia 05/27/2006  . Clinical depression 10/22/2005  . Diabetes mellitus type 2, uncontrolled ([place]) 10/22/2005  . HLD (hyperlipidemia) 10/22/2005  . Arthritis, degenerative 10/22/2005  . Current tobacco use 10/22/2005   Past Surgical History:  Procedure Laterality Date  . ABDOMINAL HYSTERECTOMY    . ANKLE FRACTURE SURGERY Left 2002  . BILATERAL SALPINGOOPHORECTOMY  2000  . CARDIAC CATHETERIZATION N/A 03/25/2015   Procedure: Right/Left Heart Cath and Coronary Angiography;  Surgeon: Belva Crome, MD;  Location: [facility];  Service: Cardiovascular;  Laterality: N/A;  . [place]  . COLONOSCOPY WITH PROPOFOL N/A 09/22/2016   Procedure: COLONOSCOPY WITH PROPOFOL;  Surgeon: Manya Silvas, MD;  Location: [facility] ENDOSCOPY;  Service: Endoscopy;  Laterality: N/A;  . EP IMPLANTABLE DEVICE N/A 08/05/2015   Procedure: Loop Recorder Insertion;  Surgeon: Deboraha Sprang, MD;  Location: [facility];  Service: Cardiovascular;  Laterality: N/A;  . ESOPHAGOGASTRODUODENOSCOPY (EGD) WITH PROPOFOL N/A 09/22/2016   Procedure: ESOPHAGOGASTRODUODENOSCOPY (EGD) WITH PROPOFOL;  Surgeon: Manya Silvas, MD;  Location: [facility] ENDOSCOPY;  Service: Endoscopy;  Laterality: N/A;  . ESOPHAGOGASTRODUODENOSCOPY (EGD) WITH PROPOFOL N/A 12/08/2016   Procedure: ESOPHAGOGASTRODUODENOSCOPY (EGD) WITH PROPOFOL;  Surgeon: Manya Silvas, MD;  Location: [facility] ENDOSCOPY;  Service: Endoscopy;  Laterality: N/A;  . KNEE ARTHROSCOPY Left 2005  . TEE WITHOUT CARDIOVERSION N/A 01/31/2015   Procedure: TRANSESOPHAGEAL ECHOCARDIOGRAM (TEE);  Surgeon: Lelon Perla, MD;  Location: [facility];  Service: Cardiovascular;  Laterality: N/A;  . [place]  . ULNAR NERVE TRANSPOSITION   2008    Past Medical History:  Diagnosis Date  . Anxiety   . Cardiomyopathy [facility])    new to her Jan 2017  . COPD (chronic obstructive pulmonary disease) ([place])   . Depression   . Diabetes mellitus, type II ([place])   . HTN (hypertension)   . Obesity   . Osteoporosis   . PONV (postoperative nausea and vomiting)   . Stroke [facility])    Jan 2017    Social History   Socioeconomic History  . Marital status: Married    Spouse name: Not on file  . Number of children: 2  . Years of education: Not on file  . Highest education level: Bachelor's degree (e.g., BA, AB, BS)  Occupational History  . Occupation: disabled    Comment: retired  Tobacco Use  . Smoking status: Current Every Day Smoker    Packs/day: 1.00    Years: 36.00    Pack years: 36.00    Types: Cigarettes    Start date: 02/08/1974  . Smokeless tobacco: Never Used  . Tobacco comment: "I can not quit smoking, I have tried". 1 PPD  Substance and Sexual Activity  . Alcohol use: No    Alcohol/week: 0.0 standard drinks  . Drug use: No  . Sexual activity: Yes    Birth control/protection: None  Other Topics Concern  . Not on file  Social History Narrative  Lives at home with stepson and husband, dogs and cats   Caffeine  Drinks sweet tea.   Right handed.    Social Determinants of Health   Financial Resource Strain:   . Difficulty of Paying Living Expenses:   Food Insecurity:   . Worried About Charity fundraiser in the Last Year:   . Arboriculturist in the Last Year:   Transportation Needs:   . Film/video editor (Medical):   Marland Kitchen Lack of Transportation (Non-Medical):   Physical Activity:   . Days of Exercise per Week:   . Minutes of Exercise per Session:   Stress:   . Feeling of Stress :   Social Connections:   . Frequency of Communication with Friends and Family:   . Frequency of Social Gatherings with Friends and Family:   . Attends Religious Services:   . Active Member of Clubs or Organizations:   .  Attends Archivist Meetings:   Marland Kitchen Marital Status:   Intimate Partner Violence:   . Fear of Current or Ex-Partner:   . Emotionally Abused:   Marland Kitchen Physically Abused:   . Sexually Abused:     Outpatient Encounter Medications as of 04/23/2019  Medication Sig  . aspirin EC 81 MG tablet Take 1 tablet (81 mg total) by mouth daily.  Marland Kitchen atorvastatin (LIPITOR) 40 MG tablet TAKE 1 TABLET BY MOUTH EVERY DAY  . calcipotriene (DOVONOX) 0.005 % cream Apply topically as needed. For psoriasis flares  . denosumab (PROLIA) 60 MG/ML SOSY injection Inject 60 mg into the skin every 6 (six) months.  . gabapentin (NEURONTIN) 300 MG capsule Take 1 capsule (300 mg total) by mouth daily.  Marland Kitchen glucose blood test strip OneTouch Verio strips  . glyBURIDE-metformin (GLUCOVANCE) 5-500 MG per tablet Take 2 tablets by mouth 2 (two) times daily.   . hydrochlorothiazide (HYDRODIURIL) 25 MG tablet Take 25 mg by mouth daily.  Marland Kitchen loratadine (CLARITIN) 10 MG tablet Take 1 tablet by mouth daily.  Marland Kitchen losartan (COZAAR) 25 MG tablet TAKE 2 TABLETS BY MOUTH EVERY DAY  . mupirocin ointment (BACTROBAN) 2 % mupirocin 2 % topical ointment  . NOVOLIN 70/30 RELION (70-30) 100 UNIT/ML injection Inject 10-50 Units into the skin 2 (two) times daily with a meal. 10units-AM,  40-50units-PM  . OZEMPIC, 1 MG/DOSE, 2 MG/1.5ML SOPN   . PARoxetine (PAXIL) 40 MG tablet TAKE 1 TABLET BY MOUTH EVERY DAY  . traMADol (ULTRAM) 50 MG tablet Take 50 mg by mouth every 6 (six) hours as needed.   . triamcinolone cream (KENALOG) 0.1 % Apply 1 application topically as needed (for psoriasis flares).   . Vitamin D, Ergocalciferol, (DRISDOL) 1.25 MG (50000 UT) CAPS capsule Take 50,000 Units by mouth once a week.  . B-D INS SYRINGE 0.5CC/31GX5/16 31G X 5/16" 0.5 ML MISC   . carvedilol (COREG) 12.5 MG tablet TAKE 1 TABLET (12.5 MG TOTAL) BY MOUTH 2 (TWO) TIMES DAILY. (Patient not taking: Reported on 04/23/2019)  . Semaglutide, 1 MG/DOSE, 2 MG/1.5ML SOPN Inject into  the skin.   No facility-administered encounter medications on file as of 04/23/2019.    Allergies  Allergen Reactions  . Codeine Nausea And Vomiting    Review of Systems  Constitutional: Positive for malaise/fatigue. Negative for chills, diaphoresis and fever.  HENT: Negative for ear pain, sinus pain and sore throat. Congestion: sinus.   Respiratory: Negative for cough, shortness of breath and wheezing.   Cardiovascular: Negative for chest pain, palpitations and leg swelling.  Musculoskeletal: Negative for myalgias.  Neurological: Positive for dizziness. Negative for headaches.    Objective:  BP (!) 158/82 (BP Location: Right Arm, Patient Position: Sitting, Cuff Size: Normal)   Pulse 91   Temp (!) 96.5 F (35.8 C) (Skin)   Wt 223 lb (101.2 kg)   SpO2 97%   BMI 35.99 kg/m   Physical Exam  Constitutional: She is well-developed, well-nourished, and in no distress.  HENT:  Head: Normocephalic.  Eyes: Conjunctivae are normal.  Cardiovascular: Normal rate.  Pulmonary/Chest: Effort normal and breath sounds normal.  Abdominal: Soft. Bowel sounds are normal.  Musculoskeletal:        General: Normal range of motion.     Cervical back: Neck supple.  Neurological: She is alert.  Poor balance and some dizziness since cryptogenic stroke. Unable to elicit DTR's in all extremities.  Skin: No rash noted.  Psychiatric: Mood, affect and judgment normal.   Diabetic Foot Form - Detailed   Diabetic Foot Exam - detailed Diabetic Foot exam was performed with the following findings: Yes 04/23/2019  2:24 PM  Can the patient see the bottom of their feet?: Yes Are the shoes appropriate in style and fit?: Yes Is there swelling or and abnormal foot shape?: No Is there a claw toe deformity?: No Is there elevated skin temparature?: No Is there foot or ankle muscle weakness?: No Normal Range of Motion: Yes Pulse Foot Exam completed.: Yes  Right posterior Tibialias: Present Left posterior  Tibialias: Present  Right Dorsalis Pedis: Present Left Dorsalis Pedis: Present  Semmes-Weinstein Monofilament Test R Site 1-Great Toe: Pos L Site 1-Great Toe: Pos    Comments: Peripheral diabetic neuropathy. No diabetic ulcers.      Assessment and Plan :  1. Type 2 diabetes mellitus with diabetic autonomic neuropathy, with long-term current use of insulin (HCC) Last Hgb A1C was over 11 in Feb. 2021 and scheduled for follow up with Dr. Gabriel Carina (endocrinologist) in a couple weeks. Presently on Ozempic injection q week and Novolin qd. Still taking a statin and ARB daily. Foot exam shows neuropathy on plantar surface of feet. No ulcerations or sores. Recheck CBC, Lipid Panel and CMP. History of CKD stage 3. Should continue regular follow up with Dr. Gabriel Carina - atorvastatin (LIPITOR) 40 MG tablet; Take 1 tablet (40 mg total) by mouth daily.  Dispense: 90 tablet; Refill: 3 - losartan (COZAAR) 25 MG tablet; Take 2 tablets (50 mg total) by mouth daily.  Dispense: 180 tablet; Refill: 3 - CBC with Differential/Platelet - Comprehensive metabolic panel - Lipid panel  2. Impaired balance as late effect of cerebrovascular accident Cardiologist recommended ASA 81 mg qd for antiplatelet therapy. Uses a walker for support with balance. No recent fall.   3. Essential hypertension Tolerating Losartan 25 mg 2 tablets qd. No palpitations, peripheral edema, chest pains or significant dyspnea. Refill meds and recheck labs. - losartan (COZAAR) 25 MG tablet; Take 2 tablets (50 mg total) by mouth daily.  Dispense: 180 tablet; Refill: 3 - CBC with Differential/Platelet - Comprehensive metabolic panel - Lipid panel  4. Compression fracture of L1 vertebra, sequela Stable with chronic pain. Followed by Rosanne Gutting, Dr. Conan Bowens (neurosurgeon) and pain management (Dr. Holley Raring).  5. Osteoporosis, unspecified osteoporosis type, unspecified pathological fracture presence Gets Prolia injection every 6 months by Dr. Gabriel Carina  (endocrinologist).  6. Hyperlipidemia, unspecified hyperlipidemia type Last follow up with Dr. Caryl Comes (cardologist) with history of cryptogenic stroke, nonischemic cardiomyopathy and bifascicular block on 04-17-19. Will recheck CMP and Lipid Panel and  refill Atorvastatin. - atorvastatin (LIPITOR) 40 MG tablet; Take 1 tablet (40 mg total) by mouth daily.  Dispense: 90 tablet; Refill: 3 - Comprehensive metabolic panel - Lipid panel

## 2019-04-24 LAB — LIPID PANEL
Chol/HDL Ratio: 3.1 ratio (ref 0.0–4.4)
Cholesterol, Total: 132 mg/dL (ref 100–199)
HDL: 43 mg/dL (ref >39)
LDL Chol Calc (NIH): 60 mg/dL (ref 0–99)
Triglycerides: 175 mg/dL — ABNORMAL HIGH (ref 0–149)
VLDL Cholesterol Cal: 29 mg/dL (ref 5–40)

## 2019-04-24 LAB — CBC WITH DIFFERENTIAL/PLATELET
Basophils Absolute: 0.1 10*3/uL (ref 0.0–0.2)
Basos: 1 %
EOS (ABSOLUTE): 0.4 10*3/uL (ref 0.0–0.4)
Eos: 4 %
Hematocrit: 42.4 % (ref 34.0–46.6)
Hemoglobin: 13.5 g/dL (ref 11.1–15.9)
Immature Grans (Abs): 0 10*3/uL (ref 0.0–0.1)
Immature Granulocytes: 0 %
Lymphocytes Absolute: 2.8 10*3/uL (ref 0.7–3.1)
Lymphs: 22 %
MCH: 28.2 pg (ref 26.6–33.0)
MCHC: 31.8 g/dL (ref 31.5–35.7)
MCV: 89 fL (ref 79–97)
Monocytes Absolute: 0.6 10*3/uL (ref 0.1–0.9)
Monocytes: 5 %
Neutrophils Absolute: 8.7 10*3/uL — ABNORMAL HIGH (ref 1.4–7.0)
Neutrophils: 68 %
Platelets: 322 10*3/uL (ref 150–450)
RBC: 4.78 x10E6/uL (ref 3.77–5.28)
RDW: 15.1 % (ref 11.7–15.4)
WBC: 12.7 10*3/uL — ABNORMAL HIGH (ref 3.4–10.8)

## 2019-04-24 LAB — COMPREHENSIVE METABOLIC PANEL
ALT: 13 IU/L (ref 0–32)
AST: 11 IU/L (ref 0–40)
Albumin/Globulin Ratio: 1.5 (ref 1.2–2.2)
Albumin: 3.8 g/dL (ref 3.8–4.8)
Alkaline Phosphatase: 89 IU/L (ref 39–117)
BUN/Creatinine Ratio: 18 (ref 12–28)
BUN: 32 mg/dL — ABNORMAL HIGH (ref 8–27)
Bilirubin Total: <0.2 mg/dL (ref 0.0–1.2)
CO2: 22 mmol/L (ref 20–29)
Calcium: 8.7 mg/dL (ref 8.7–10.3)
Chloride: 107 mmol/L — ABNORMAL HIGH (ref 96–106)
Creatinine, Ser: 1.82 mg/dL — ABNORMAL HIGH (ref 0.57–1.00)
GFR calc Af Amer: 33 mL/min/{1.73_m2} — ABNORMAL LOW (ref >59)
GFR calc non Af Amer: 29 mL/min/{1.73_m2} — ABNORMAL LOW (ref >59)
Globulin, Total: 2.5 g/dL (ref 1.5–4.5)
Glucose: 160 mg/dL — ABNORMAL HIGH (ref 65–99)
Potassium: 5.2 mmol/L (ref 3.5–5.2)
Sodium: 143 mmol/L (ref 134–144)
Total Protein: 6.3 g/dL (ref 6.0–8.5)

## 2019-04-25 ENCOUNTER — Telehealth: Payer: Self-pay

## 2019-04-25 NOTE — Telephone Encounter (Signed)
-----   Message from Margo Common, [place] sent at 04/24/2019  8:11 AM EDT ----- Blood sugar and triglycerides improving with present medications and diet. Kidney function worsening. Continue present medications and drink plenty of water daily. WBC count above normal. Monitor for any signs of infections and let this office know if present. Continue to check FBS daily and follow up with Dr. Gabriel Carina to maintain best control of diabetes.

## 2019-04-25 NOTE — Telephone Encounter (Signed)
LMTCB, PEC Triage Nurse may give patient results  

## 2019-04-26 ENCOUNTER — Telehealth: Payer: Self-pay | Admitting: *Deleted

## 2019-04-26 NOTE — Telephone Encounter (Signed)
Pt returned call and notified of lab results message. She voiced understanding.

## 2019-05-02 DIAGNOSIS — F17200 Nicotine dependence, unspecified, uncomplicated: Secondary | ICD-10-CM | POA: Diagnosis not present

## 2019-05-02 DIAGNOSIS — E1129 Type 2 diabetes mellitus with other diabetic kidney complication: Secondary | ICD-10-CM | POA: Diagnosis not present

## 2019-05-02 DIAGNOSIS — M810 Age-related osteoporosis without current pathological fracture: Secondary | ICD-10-CM | POA: Diagnosis not present

## 2019-05-02 DIAGNOSIS — E1121 Type 2 diabetes mellitus with diabetic nephropathy: Secondary | ICD-10-CM | POA: Diagnosis not present

## 2019-05-02 DIAGNOSIS — I700 Atherosclerosis of aorta: Secondary | ICD-10-CM | POA: Diagnosis not present

## 2019-05-02 DIAGNOSIS — E113393 Type 2 diabetes mellitus with moderate nonproliferative diabetic retinopathy without macular edema, bilateral: Secondary | ICD-10-CM | POA: Diagnosis not present

## 2019-05-02 DIAGNOSIS — E1122 Type 2 diabetes mellitus with diabetic chronic kidney disease: Secondary | ICD-10-CM | POA: Diagnosis not present

## 2019-05-02 DIAGNOSIS — E1159 Type 2 diabetes mellitus with other circulatory complications: Secondary | ICD-10-CM | POA: Diagnosis not present

## 2019-05-02 DIAGNOSIS — E1165 Type 2 diabetes mellitus with hyperglycemia: Secondary | ICD-10-CM | POA: Diagnosis not present

## 2019-05-02 DIAGNOSIS — Z794 Long term (current) use of insulin: Secondary | ICD-10-CM | POA: Diagnosis not present

## 2019-05-02 DIAGNOSIS — R809 Proteinuria, unspecified: Secondary | ICD-10-CM | POA: Diagnosis not present

## 2019-05-02 DIAGNOSIS — E1142 Type 2 diabetes mellitus with diabetic polyneuropathy: Secondary | ICD-10-CM | POA: Diagnosis not present

## 2019-05-10 ENCOUNTER — Telehealth: Payer: Self-pay | Admitting: *Deleted

## 2019-05-10 ENCOUNTER — Telehealth: Payer: Self-pay | Admitting: Student in an Organized Health Care Education/Training Program

## 2019-05-10 MED ORDER — APIXABAN 2.5 MG PO TABS: 2.5000 mg | ORAL_TABLET | Freq: Two times a day (BID) | ORAL | 11 refills | Status: DC

## 2019-05-10 NOTE — Telephone Encounter (Signed)
Talked with patient. She will need a VV with Dr Holley Raring . He has never prescribed this med for her.

## 2019-05-10 NOTE — Telephone Encounter (Signed)
I spoke with the patient. I have advised her of Dr. Olin Pia recommendations to follow up with her PCP and we will see her back on an as needed basis.  The patient voices understanding and is agreeable.

## 2019-05-10 NOTE — Telephone Encounter (Signed)
I called and spoke with the patient. I advised her of Dr. Olin Pia recommendations to: 1) Start eliquis 2.5 mg BID 2) Stop Aspirin  The patient inquired about a follow up. At her visit on 04/17/19, Dr. Caryl Comes had recommended seeing her PRN. I advised the patient based on resumption of eliquis, I will discuss with Dr. Caryl Comes to see what her follow up should be going forward. In reviewing her chart further, her ILR was implanted on 2017 and reached RRT 02/19/19.   The patient is aware she will receive a call back.

## 2019-05-10 NOTE — Telephone Encounter (Signed)
-----   Message from Deboraha Sprang, MD sent at 05/10/2019 10:27 AM EDT ----- Asking around seeing that anticoagulation is appropriate for unprovoked DVT going forward to reduce the risk of recurrent DVT/PE.  Hence, we will probably stop the aspirin and put her on low-dose Eliquis at 2.5 twice daily.  We will continue to monitor her Linq recorder for atrial fibrillation.  ----- Message ----- From: Emily Filbert, RN Sent: 04/19/2019   3:08 PM EDT To: Deboraha Sprang, MD, Emily Filbert, RN  Any word on whether she needs to be on a blood thinner due to her history of unprovoked DVT?

## 2019-05-10 NOTE — Telephone Encounter (Signed)
I reviewed the patient's follow up with Dr. Caryl Comes and reminded him the patient's Cruz Condon is at RRT. Per Dr. Caryl Comes, the patient still does not need to see him back at this time, but should continue to follow up with her PCP.   I attempted to call the patient. No answer- I left a message to please call back.

## 2019-05-10 NOTE — Telephone Encounter (Signed)
Patient lvmail stating she needs refill on Tramadol, she is almost out.

## 2019-05-10 NOTE — Telephone Encounter (Signed)
Patient returning call.

## 2019-05-16 ENCOUNTER — Encounter: Payer: Self-pay | Admitting: Student in an Organized Health Care Education/Training Program

## 2019-05-17 ENCOUNTER — Ambulatory Visit
Payer: Medicare Other | Attending: Student in an Organized Health Care Education/Training Program | Admitting: Student in an Organized Health Care Education/Training Program

## 2019-05-17 ENCOUNTER — Encounter: Payer: Self-pay | Admitting: Student in an Organized Health Care Education/Training Program

## 2019-05-17 ENCOUNTER — Other Ambulatory Visit: Payer: Self-pay

## 2019-05-17 VITALS — Wt 216.0 lb

## 2019-05-17 DIAGNOSIS — M48062 Spinal stenosis, lumbar region with neurogenic claudication: Secondary | ICD-10-CM | POA: Diagnosis not present

## 2019-05-17 DIAGNOSIS — Z8639 Personal history of other endocrine, nutritional and metabolic disease: Secondary | ICD-10-CM

## 2019-05-17 DIAGNOSIS — G894 Chronic pain syndrome: Secondary | ICD-10-CM | POA: Diagnosis not present

## 2019-05-17 DIAGNOSIS — M48061 Spinal stenosis, lumbar region without neurogenic claudication: Secondary | ICD-10-CM

## 2019-05-17 DIAGNOSIS — F17203 Nicotine dependence unspecified, with withdrawal: Secondary | ICD-10-CM | POA: Diagnosis not present

## 2019-05-17 DIAGNOSIS — S32010S Wedge compression fracture of first lumbar vertebra, sequela: Secondary | ICD-10-CM

## 2019-05-17 DIAGNOSIS — S22000A Wedge compression fracture of unspecified thoracic vertebra, initial encounter for closed fracture: Secondary | ICD-10-CM | POA: Diagnosis not present

## 2019-05-17 DIAGNOSIS — I63312 Cerebral infarction due to thrombosis of left middle cerebral artery: Secondary | ICD-10-CM | POA: Diagnosis not present

## 2019-05-17 DIAGNOSIS — E1129 Type 2 diabetes mellitus with other diabetic kidney complication: Secondary | ICD-10-CM

## 2019-05-17 MED ORDER — TRAMADOL HCL 50 MG PO TABS: 50.0000 mg | ORAL_TABLET | Freq: Three times a day (TID) | ORAL | 0 refills | Status: DC | PRN

## 2019-05-17 NOTE — Progress Notes (Signed)
Patient: Jocelyn Wells  Service Category: E/M  Provider: Gillis Santa, MD  DOB: 1955-04-03  DOS: 05/17/2019  Location: Office  MRN: [id number]  Setting: Ambulatory outpatient  Referring Provider: Margo Common, PA  Type: Established Patient  Specialty: Interventional Pain Management  PCP: Jocelyn Common, PA  Location: Home  Delivery: TeleHealth     Virtual Encounter - Pain Management PROVIDER NOTE: Information contained herein reflects review and annotations entered in association with encounter. Interpretation of such information and data should be left to medically-trained personnel. Information provided to patient can be located elsewhere in the medical record under "Patient Instructions". Document created using STT-dictation technology, any transcriptional errors that may result from process are unintentional.    Contact & Pharmacy Preferred: [phone] Home: [phone] (home) Mobile: [phone] (mobile) E-mail: [email or web address]  [id number] [id number]: [phone]: [phone]  Pre-screening  [name] offered "in-person" vs "virtual" encounter. She indicated preferring virtual for this encounter.   Reason COVID-19*  Social distancing based on CDC and AMA recommendations.   I contacted Jocelyn Abbeon 05/17/2019 via telephone.      I clearly identified myself as BGillis Santa MD. I verified that I was speaking with the correct person using two identifiers (Name: Jocelyn Wells and date of birth: [birth year].  This visit was completed via telephone due to the restrictions of the COVID-19 pandemic. All issues as above were discussed and addressed but no physical exam was performed. If it was felt that the patient should be evaluated in the office, they were directed there. The patient verbally consented to this visit. Patient was unable to complete an audio/visual visit due to Technical  difficulties and/or Lack of internet. Due to the catastrophic nature of the COVID-19 pandemic, this visit was done through audio contact only.  Location of the patient: home address (see Epic for details)  Location of the provider: office  Consent I sought verbal advanced consent from Jocelyn Abbefor virtual visit interactions. I informed Jocelyn Wells of possible security and privacy concerns, risks, and limitations associated with providing "not-in-person" medical evaluation and management services. I also informed Jocelyn Wells of the availability of "in-person" appointments. Finally, I informed her that there would be a charge for the virtual visit and that she could be  personally, fully or partially, financially responsible for it. Jocelyn Wells understanding and agreed to proceed.   Historic Elements   Ms. Jocelyn FIGGis a 64y.o. year old, female patient evaluated today after her last contact with our practice on 05/10/2019. [name] has a past medical history of Anxiety, Cardiomyopathy ([place], COPD (chronic obstructive pulmonary disease) ([place], Depression, Diabetes mellitus, type II ([place], HTN (hypertension), Obesity, Osteoporosis, PONV (postoperative nausea and vomiting), and Stroke ([place]. She also  has a past surgical history that includes Abdominal hysterectomy; Tubal ligation (1978); Cesarean section (1978); Knee arthroscopy (Left, 2005); Ulnar nerve transposition (2008); Bilateral salpingoophorectomy (2000); Ankle fracture surgery (Left, 2002); TEE without cardioversion (N/A, 01/31/2015); Cardiac catheterization (N/A, 03/25/2015); Cardiac catheterization (N/A, 08/05/2015); Esophagogastroduodenoscopy (egd) with propofol (N/A, 09/22/2016); Colonoscopy with propofol (N/A, 09/22/2016); and Esophagogastroduodenoscopy (egd) with propofol (N/A, 12/08/2016). Ms. SVillalonhas a current medication list which includes the following prescription(s): apixaban, atorvastatin, b-d ins syringe  0.5cc/31gx5/16, denosumab, gabapentin, glucose blood, glyburide-metformin, hydrochlorothiazide, loratadine, losartan, mupirocin ointment, novolin 70/30 relion, ozempic (1 mg/dose), paroxetine, tramadol, vitamin d (ergocalciferol), calcipotriene, semaglutide (1 mg/dose), and  triamcinolone cream. She  reports that she has been smoking cigarettes. She started smoking about 45 years ago. She has a 36.00 pack-year smoking history. She has never used smokeless tobacco. She reports that she does not drink alcohol or use drugs. Jocelyn Wells is allergic to codeine.   HPI  Today, she is being contacted for medication management.  Pharmacotherapy Assessment  Analgesic:  Tramadol 50 mg BID prn (usually qday PRN)  Monitoring: [place] PMP: PDMP reviewed during this encounter.       Pharmacotherapy: No side-effects or adverse reactions reported. Compliance: No problems identified. Effectiveness: Clinically acceptable. Plan: Refer to "POC".  UDS:  Summary  Date Value Ref Range Status  04/17/2019 Note  Final    Comment:    ==================================================================== Compliance Drug Analysis, Ur ==================================================================== Test                             Result       Flag       Units Drug Present   Tramadol                       >6410                   ng/mg creat   O-Desmethyltramadol            3904                    ng/mg creat   N-Desmethyltramadol            3140                    ng/mg creat    Source of tramadol is a prescription medication. O-desmethyltramadol    and N-desmethyltramadol are expected metabolites of tramadol.   Gabapentin                     PRESENT   Paroxetine                     PRESENT ==================================================================== Test                      Result    Flag   Units      Ref Range   Creatinine              78               mg/dL       >=20 ==================================================================== Declared Medications:  Medication list was not provided. ==================================================================== For clinical consultation, please call [phone]. ====================================================================    Laboratory Chemistry Profile   Renal Lab Results  Component Value Date   BUN 32 (H) 04/23/2019   CREATININE 1.82 (H) 04/23/2019   BCR 18 04/23/2019   GFRAA 33 (L) 04/23/2019   GFRNONAA 29 (L) 04/23/2019     Hepatic Lab Results  Component Value Date   AST 11 04/23/2019   ALT 13 04/23/2019   ALBUMIN 3.8 04/23/2019   ALKPHOS 89 04/23/2019     Electrolytes Lab Results  Component Value Date   NA 143 04/23/2019   K 5.2 04/23/2019   CL 107 (H) 04/23/2019   CALCIUM 8.7 04/23/2019     Bone No results found for: VD25OH, VD125OH2TOT, VX7939QZ0, SP2330QT6, 25OHVITD1, 25OHVITD2, 25OHVITD3, TESTOFREE, TESTOSTERONE   Inflammation (CRP: Acute Phase) (ESR: Chronic Phase) No results found  for: CRP, ESRSEDRATE, LATICACIDVEN     Note: Above Lab results reviewed.  Imaging  CUP PACEART INCLINIC DEVICE CHECK Loop check in clinic. Battery status: EOS as of 03/14/19. No symptom, tachy, or brady episodes. 4 pause episodes--false, undersensing. 1 AF episode--false, intermittent undersensing of SR. Patient declines ILR explant. ROV with SK PRN.Levander Campion BSN,  RN, CCDS  Assessment  The primary encounter diagnosis was Compression fracture of L1 vertebra, sequela. Diagnoses of Compression fracture of body of thoracic vertebra (HCC), Lumbar foraminal stenosis (RIGHT L1), Spinal stenosis of lumbar region with neurogenic claudication (L4-L5), H/O diabetes mellitus, Type 2 diabetes mellitus with other diabetic kidney complication ([place]), Cerebrovascular accident (CVA) due to thrombosis of left middle cerebral artery ([place]), Nicotine dependence with withdrawal, unspecified  nicotine product type, and Chronic pain syndrome were also pertinent to this visit.  Plan of Care  [name] has a current medication list which includes the following long-term medication(s): apixaban, atorvastatin, gabapentin, glyburide-metformin, hydrochlorothiazide, loratadine, losartan, novolin 70/30 relion, and paroxetine.  Pharmacotherapy (Medications Ordered): Meds ordered this encounter  Medications  . traMADol (ULTRAM) 50 MG tablet    Sig: Take 1 tablet (50 mg total) by mouth every 8 (eight) hours as needed.    Dispense:  90 tablet    Refill:  0   Follow-up plan:   Return in about 3 months (around 08/21/2019) for Medication Management, in person.    Recent Visits Date Type Provider Dept  02/20/19 Office Visit Jocelyn Santa, MD [facility]  Showing recent visits within past 90 days and meeting all other requirements   Today's Visits Date Type Provider Dept  05/17/19 Office Visit Jocelyn Santa, MD [facility]  Showing today's visits and meeting all other requirements   Future Appointments No visits were found meeting these conditions.  Showing future appointments within next 90 days and meeting all other requirements   I discussed the assessment and treatment plan with the patient. The patient was provided an opportunity to ask questions and all were answered. The patient agreed with the plan and demonstrated an understanding of the instructions.  Patient advised to call back or seek an in-person evaluation if the symptoms or condition worsens.  Duration of encounter: 25 minutes.  Note by: Jocelyn Santa, MD Date: 05/17/2019; Time: 2:50 PM

## 2019-06-29 ENCOUNTER — Encounter: Payer: Self-pay | Admitting: Psychiatry

## 2019-06-29 ENCOUNTER — Other Ambulatory Visit: Payer: Self-pay

## 2019-06-29 ENCOUNTER — Telehealth (INDEPENDENT_AMBULATORY_CARE_PROVIDER_SITE_OTHER): Payer: Medicare Other | Admitting: Psychiatry

## 2019-06-29 DIAGNOSIS — F3342 Major depressive disorder, recurrent, in full remission: Secondary | ICD-10-CM | POA: Diagnosis not present

## 2019-06-29 MED ORDER — PAROXETINE HCL 40 MG PO TABS: 40.0000 mg | ORAL_TABLET | Freq: Every day | ORAL | 1 refills | Status: DC

## 2019-06-29 NOTE — Progress Notes (Signed)
[place] MD OP Progress Note  Virtual Visit via Telephone Note  I connected with Jocelyn Wells on 06/29/19 at 11:30 AM EDT by telephone and verified that I am speaking with the correct person using two identifiers.  Location: Patient: home Provider: Clinic   I discussed the limitations, risks, security and privacy concerns of performing an evaluation and management service by telephone and the availability of in person appointments. I also discussed with the patient that there may be a patient responsible charge related to this service. The patient expressed understanding and agreed to proceed.   I provided 8 minutes of non-face-to-face time during this encounter.   06/29/2019 11:28 AM Jocelyn Wells  MRN:  [id number]  Chief Complaint: " Everything is good."  HPI: Patient reported that she has been doing well.  She recently got her COVID 19 vaccination. Her mood has been stable. She is sleeping well for the most part. She denied any acute issues or concerns at this time.  Visit Diagnosis:    ICD-10-CM   1. MDD (major depressive disorder), recurrent, in full remission ([place])  F33.42     Past Psychiatric History: MDD  Past Medical History:  Past Medical History:  Diagnosis Date  . Anxiety   . Cardiomyopathy [facility])    new to her Jan 2017  . COPD (chronic obstructive pulmonary disease) ([place])   . Depression   . Diabetes mellitus, type II ([place])   . HTN (hypertension)   . Obesity   . Osteoporosis   . PONV (postoperative nausea and vomiting)   . Stroke [facility])    Jan 2017    Past Surgical History:  Procedure Laterality Date  . ABDOMINAL HYSTERECTOMY    . ANKLE FRACTURE SURGERY Left 2002  . BILATERAL SALPINGOOPHORECTOMY  2000  . CARDIAC CATHETERIZATION N/A 03/25/2015   Procedure: Right/Left Heart Cath and Coronary Angiography;  Surgeon: Belva Crome, MD;  Location: [place] CV LAB;  Service: Cardiovascular;  Laterality: N/A;  . [place]  . COLONOSCOPY WITH  PROPOFOL N/A 09/22/2016   Procedure: COLONOSCOPY WITH PROPOFOL;  Surgeon: Manya Silvas, MD;  Location: [facility] ENDOSCOPY;  Service: Endoscopy;  Laterality: N/A;  . EP IMPLANTABLE DEVICE N/A 08/05/2015   Procedure: Loop Recorder Insertion;  Surgeon: Deboraha Sprang, MD;  Location: [place] CV LAB;  Service: Cardiovascular;  Laterality: N/A;  . ESOPHAGOGASTRODUODENOSCOPY (EGD) WITH PROPOFOL N/A 09/22/2016   Procedure: ESOPHAGOGASTRODUODENOSCOPY (EGD) WITH PROPOFOL;  Surgeon: Manya Silvas, MD;  Location: [facility] ENDOSCOPY;  Service: Endoscopy;  Laterality: N/A;  . ESOPHAGOGASTRODUODENOSCOPY (EGD) WITH PROPOFOL N/A 12/08/2016   Procedure: ESOPHAGOGASTRODUODENOSCOPY (EGD) WITH PROPOFOL;  Surgeon: Manya Silvas, MD;  Location: [facility] ENDOSCOPY;  Service: Endoscopy;  Laterality: N/A;  . KNEE ARTHROSCOPY Left 2005  . TEE WITHOUT CARDIOVERSION N/A 01/31/2015   Procedure: TRANSESOPHAGEAL ECHOCARDIOGRAM (TEE);  Surgeon: Lelon Perla, MD;  Location: [place];  Service: Cardiovascular;  Laterality: N/A;  . [place]  . ULNAR NERVE TRANSPOSITION  2008    Family Psychiatric History: see below  Family History:  Family History  Problem Relation Age of Onset  . Heart disease Mother        died from CHF  . Asthma Mother   . Diabetes Mother   . Heart disease Father   . Aneurysm Father   . COPD Brother   . Diabetes Brother   . Alcohol abuse Paternal Aunt   . Anemia Neg Hx   . Arrhythmia Neg Hx   .  Clotting disorder Neg Hx   . Fainting Neg Hx   . Heart attack Neg Hx   . Heart failure Neg Hx   . Hyperlipidemia Neg Hx   . Hypertension Neg Hx     Social History:  Social History   Socioeconomic History  . Marital status: Married    Spouse name: Not on file  . Number of children: 2  . Years of education: Not on file  . Highest education level: Bachelor's degree (e.g., BA, AB, BS)  Occupational History  . Occupation: disabled    Comment: retired  Tobacco Use  .  Smoking status: Current Every Day Smoker    Packs/day: 1.00    Years: 36.00    Pack years: 36.00    Types: Cigarettes    Start date: 02/08/1974  . Smokeless tobacco: Never Used  . Tobacco comment: "I can not quit smoking, I have tried". 1 PPD  Substance and Sexual Activity  . Alcohol use: No    Alcohol/week: 0.0 standard drinks  . Drug use: No  . Sexual activity: Yes    Birth control/protection: None  Other Topics Concern  . Not on file  Social History Narrative   Lives at home with stepson and husband, dogs and cats   Caffeine  Drinks sweet tea.   Right handed.    Social Determinants of Health   Financial Resource Strain:   . Difficulty of Paying Living Expenses:   Food Insecurity:   . Worried About Charity fundraiser in the Last Year:   . Arboriculturist in the Last Year:   Transportation Needs:   . Film/video editor (Medical):   Marland Kitchen Lack of Transportation (Non-Medical):   Physical Activity:   . Days of Exercise per Week:   . Minutes of Exercise per Session:   Stress:   . Feeling of Stress :   Social Connections:   . Frequency of Communication with Friends and Family:   . Frequency of Social Gatherings with Friends and Family:   . Attends Religious Services:   . Active Member of Clubs or Organizations:   . Attends Archivist Meetings:   Marland Kitchen Marital Status:     Allergies:  Allergies  Allergen Reactions  . Codeine Nausea And Vomiting    Metabolic Disorder Labs: Lab Results  Component Value Date   HGBA1C 11.4 03/28/2019   MPG 303.44 10/06/2017   MPG 194 01/30/2015   No results found for: PROLACTIN Lab Results  Component Value Date   CHOL 132 04/23/2019   TRIG 175 (H) 04/23/2019   HDL 43 04/23/2019   CHOLHDL 3.1 04/23/2019   VLDL 64 (H) 10/06/2017   LDLCALC 60 04/23/2019   LDLCALC 88 10/06/2017   No results found for: TSH  Therapeutic Level Labs: No results found for: LITHIUM No results found for: VALPROATE No components found for:   CBMZ  Current Medications: Current Outpatient Medications  Medication Sig Dispense Refill  . apixaban (ELIQUIS) 2.5 MG TABS tablet Take 1 tablet (2.5 mg total) by mouth 2 (two) times daily. 60 tablet 11  . atorvastatin (LIPITOR) 40 MG tablet Take 1 tablet (40 mg total) by mouth daily. 90 tablet 3  . B-D INS SYRINGE 0.5CC/31GX5/16 31G X 5/16" 0.5 ML MISC     . calcipotriene (DOVONOX) 0.005 % cream Apply topically as needed. For psoriasis flares    . denosumab (PROLIA) 60 MG/ML SOSY injection Inject 60 mg into the skin every 6 (six) months.    Marland Kitchen  gabapentin (NEURONTIN) 300 MG capsule Take 1 capsule (300 mg total) by mouth daily. (Patient taking differently: Take 300 mg by mouth 2 (two) times daily. ) 90 capsule 3  . glucose blood test strip OneTouch Verio strips    . glyBURIDE-metformin (GLUCOVANCE) 5-500 MG per tablet Take 2 tablets by mouth 2 (two) times daily.     . hydrochlorothiazide (HYDRODIURIL) 25 MG tablet Take 25 mg by mouth daily.    Marland Kitchen loratadine (CLARITIN) 10 MG tablet Take 1 tablet by mouth daily.    Marland Kitchen losartan (COZAAR) 25 MG tablet Take 2 tablets (50 mg total) by mouth daily. 180 tablet 3  . mupirocin ointment (BACTROBAN) 2 % mupirocin 2 % topical ointment    . NOVOLIN 70/30 RELION (70-30) 100 UNIT/ML injection Inject 10-50 Units into the skin 2 (two) times daily with a meal. 10units-AM,  40-50units-PM    . OZEMPIC, 1 MG/DOSE, 2 MG/1.5ML SOPN     . PARoxetine (PAXIL) 40 MG tablet TAKE 1 TABLET BY MOUTH EVERY DAY 90 tablet 1  . Semaglutide, 1 MG/DOSE, 2 MG/1.5ML SOPN Inject into the skin.    Marland Kitchen traMADol (ULTRAM) 50 MG tablet Take 1 tablet (50 mg total) by mouth every 8 (eight) hours as needed. 90 tablet 0  . triamcinolone cream (KENALOG) 0.1 % Apply 1 application topically as needed (for psoriasis flares).     . Vitamin D, Ergocalciferol, (DRISDOL) 1.25 MG (50000 UT) CAPS capsule Take 50,000 Units by mouth once a week.     No current facility-administered medications for this visit.     Musculoskeletal: Strength & Muscle Tone: unable to assess due to telemed visit Gait & Station: unable to assess due to telemed visit Patient leans: unable to assess due to telemed visit   Psychiatric Specialty Exam: Review of Systems  There were no vitals taken for this visit.There is no height or weight on file to calculate BMI.  General Appearance: unable to assess due to phone visit  Eye Contact:  unable to assess due to phone visit  Speech:  Clear and Coherent and Normal Rate  Volume:  Normal  Mood:  Euthymic  Affect:  Congruent  Thought Process:  Goal Directed, Linear and Descriptions of Associations: Intact  Orientation:  Full (Time, Place, and Person)  Thought Content: Logical   Suicidal Thoughts:  No  Homicidal Thoughts:  No  Memory:  Recent;   Good Remote;   Good  Judgement:  Fair  Insight:  Fair  Psychomotor Activity:  Normal  Concentration:  Concentration: Good and Attention Span: Good  Recall:  Good  Fund of Knowledge: Good  Language: Good  Akathisia:  Negative  Handed:  Right  AIMS (if indicated): not done  Assets:  Communication Skills Desire for Improvement Financial Resources/Insurance Housing  ADL's:  Intact  Cognition: WNL  Sleep:  Fair     Screenings: PHQ2-9     Clinical Support from 04/10/2018 in [place] from 12/26/2017 in [place] Office Visit from 11/15/2017 in [place] from 03/17/2017 in [place] from 03/11/2016 in [place]  PHQ-2 Total Score  2  0  0  2  1  PHQ-9 Total Score  6  --  --  9  --       Assessment and Plan: This is a 64 year old female with history of MDD and other medical comorbidities.  Patient seems to be stable on her current regimen.  1. MDD (  major depressive disorder), recurrent, in full remission ([place]) - Continue Paxil 40 mg daily.  F/up in 4 months.   [place] Crane,  MD 06/29/2019, 11:28 AM

## 2019-07-06 DIAGNOSIS — I10 Essential (primary) hypertension: Secondary | ICD-10-CM | POA: Diagnosis not present

## 2019-07-06 DIAGNOSIS — Z9119 Patient's noncompliance with other medical treatment and regimen: Secondary | ICD-10-CM | POA: Diagnosis not present

## 2019-07-06 DIAGNOSIS — E1165 Type 2 diabetes mellitus with hyperglycemia: Secondary | ICD-10-CM | POA: Diagnosis not present

## 2019-07-06 DIAGNOSIS — E1122 Type 2 diabetes mellitus with diabetic chronic kidney disease: Secondary | ICD-10-CM | POA: Diagnosis not present

## 2019-07-06 DIAGNOSIS — F17200 Nicotine dependence, unspecified, uncomplicated: Secondary | ICD-10-CM | POA: Diagnosis not present

## 2019-07-06 DIAGNOSIS — E1121 Type 2 diabetes mellitus with diabetic nephropathy: Secondary | ICD-10-CM | POA: Diagnosis not present

## 2019-07-06 DIAGNOSIS — N183 Chronic kidney disease, stage 3 unspecified: Secondary | ICD-10-CM | POA: Diagnosis not present

## 2019-07-06 DIAGNOSIS — E1129 Type 2 diabetes mellitus with other diabetic kidney complication: Secondary | ICD-10-CM | POA: Diagnosis not present

## 2019-07-06 DIAGNOSIS — E113393 Type 2 diabetes mellitus with moderate nonproliferative diabetic retinopathy without macular edema, bilateral: Secondary | ICD-10-CM | POA: Diagnosis not present

## 2019-07-06 DIAGNOSIS — M810 Age-related osteoporosis without current pathological fracture: Secondary | ICD-10-CM | POA: Diagnosis not present

## 2019-07-06 DIAGNOSIS — E1142 Type 2 diabetes mellitus with diabetic polyneuropathy: Secondary | ICD-10-CM | POA: Diagnosis not present

## 2019-07-06 DIAGNOSIS — Z794 Long term (current) use of insulin: Secondary | ICD-10-CM | POA: Diagnosis not present

## 2019-07-06 DIAGNOSIS — E1159 Type 2 diabetes mellitus with other circulatory complications: Secondary | ICD-10-CM | POA: Diagnosis not present

## 2019-07-19 DIAGNOSIS — M5124 Other intervertebral disc displacement, thoracic region: Secondary | ICD-10-CM | POA: Diagnosis not present

## 2019-07-20 DIAGNOSIS — Z794 Long term (current) use of insulin: Secondary | ICD-10-CM | POA: Diagnosis not present

## 2019-07-20 DIAGNOSIS — F17200 Nicotine dependence, unspecified, uncomplicated: Secondary | ICD-10-CM | POA: Diagnosis not present

## 2019-07-20 DIAGNOSIS — E1121 Type 2 diabetes mellitus with diabetic nephropathy: Secondary | ICD-10-CM | POA: Diagnosis not present

## 2019-07-20 DIAGNOSIS — E1165 Type 2 diabetes mellitus with hyperglycemia: Secondary | ICD-10-CM | POA: Diagnosis not present

## 2019-07-20 DIAGNOSIS — E1122 Type 2 diabetes mellitus with diabetic chronic kidney disease: Secondary | ICD-10-CM | POA: Diagnosis not present

## 2019-07-20 DIAGNOSIS — I10 Essential (primary) hypertension: Secondary | ICD-10-CM | POA: Diagnosis not present

## 2019-07-20 DIAGNOSIS — E113393 Type 2 diabetes mellitus with moderate nonproliferative diabetic retinopathy without macular edema, bilateral: Secondary | ICD-10-CM | POA: Diagnosis not present

## 2019-07-20 DIAGNOSIS — I700 Atherosclerosis of aorta: Secondary | ICD-10-CM | POA: Diagnosis not present

## 2019-07-20 DIAGNOSIS — E1129 Type 2 diabetes mellitus with other diabetic kidney complication: Secondary | ICD-10-CM | POA: Diagnosis not present

## 2019-07-20 DIAGNOSIS — E1142 Type 2 diabetes mellitus with diabetic polyneuropathy: Secondary | ICD-10-CM | POA: Diagnosis not present

## 2019-07-20 DIAGNOSIS — M810 Age-related osteoporosis without current pathological fracture: Secondary | ICD-10-CM | POA: Diagnosis not present

## 2019-07-20 DIAGNOSIS — E1159 Type 2 diabetes mellitus with other circulatory complications: Secondary | ICD-10-CM | POA: Diagnosis not present

## 2019-08-14 ENCOUNTER — Encounter: Payer: Medicare Other | Admitting: Student in an Organized Health Care Education/Training Program

## 2019-08-15 ENCOUNTER — Telehealth: Payer: Self-pay | Admitting: *Deleted

## 2019-08-15 NOTE — Telephone Encounter (Signed)
Left voicemail with patient that DR Holley Raring is not in the office this week.  She has appt on August 21, 2019.  Requested that she keep that appt and he may refill Rx at that time.

## 2019-08-21 ENCOUNTER — Ambulatory Visit
Payer: Medicare Other | Attending: Student in an Organized Health Care Education/Training Program | Admitting: Student in an Organized Health Care Education/Training Program

## 2019-08-21 ENCOUNTER — Encounter: Payer: Self-pay | Admitting: Student in an Organized Health Care Education/Training Program

## 2019-08-21 ENCOUNTER — Other Ambulatory Visit: Payer: Self-pay

## 2019-08-21 VITALS — BP 124/81 | HR 83 | Temp 97.8°F | Resp 18 | Ht 66.0 in | Wt 216.0 lb

## 2019-08-21 DIAGNOSIS — E1165 Type 2 diabetes mellitus with hyperglycemia: Secondary | ICD-10-CM

## 2019-08-21 DIAGNOSIS — G894 Chronic pain syndrome: Secondary | ICD-10-CM | POA: Diagnosis not present

## 2019-08-21 DIAGNOSIS — IMO0002 Reserved for concepts with insufficient information to code with codable children: Secondary | ICD-10-CM

## 2019-08-21 DIAGNOSIS — I63033 Cerebral infarction due to thrombosis of bilateral carotid arteries: Secondary | ICD-10-CM | POA: Diagnosis not present

## 2019-08-21 DIAGNOSIS — E1129 Type 2 diabetes mellitus with other diabetic kidney complication: Secondary | ICD-10-CM | POA: Insufficient documentation

## 2019-08-21 DIAGNOSIS — S22000A Wedge compression fracture of unspecified thoracic vertebra, initial encounter for closed fracture: Secondary | ICD-10-CM | POA: Diagnosis not present

## 2019-08-21 DIAGNOSIS — M48062 Spinal stenosis, lumbar region with neurogenic claudication: Secondary | ICD-10-CM | POA: Insufficient documentation

## 2019-08-21 DIAGNOSIS — M48061 Spinal stenosis, lumbar region without neurogenic claudication: Secondary | ICD-10-CM | POA: Diagnosis not present

## 2019-08-21 DIAGNOSIS — S32010S Wedge compression fracture of first lumbar vertebra, sequela: Secondary | ICD-10-CM | POA: Diagnosis not present

## 2019-08-21 DIAGNOSIS — I63312 Cerebral infarction due to thrombosis of left middle cerebral artery: Secondary | ICD-10-CM | POA: Diagnosis not present

## 2019-08-21 MED ORDER — TRAMADOL HCL 50 MG PO TABS: 50.0000 mg | ORAL_TABLET | Freq: Two times a day (BID) | ORAL | 2 refills | Status: AC | PRN

## 2019-08-21 NOTE — Progress Notes (Signed)
Nursing Pain Medication Assessment:  Safety precautions to be maintained throughout the outpatient stay will include: orient to surroundings, keep bed in low position, maintain call bell within reach at all times, provide assistance with transfer out of bed and ambulation.  Medication Inspection Compliance: Jocelyn Wells did not comply with our request to bring her pills to be counted. She was reminded that bringing the medication bottles, even when empty, is a requirement.  Medication: None brought in. Pill/Patch Count: None available to be counted. Bottle Appearance: No container available. Did not bring bottle(s) to appointment. Filled Date: N/A Last Medication intake:  Ran out of medicine more than 48 hours ago on 08/17/19.   Inform patient to bring bottle on next visit. Pt verbalized understanding.   Safety precautions to be maintained throughout the outpatient stay will include: orient to surroundings, keep bed in low position, maintain call bell within reach at all times, provide assistance with transfer out of bed and ambulation.

## 2019-08-21 NOTE — Progress Notes (Signed)
PROVIDER NOTE: Information contained herein reflects review and annotations entered in association with encounter. Interpretation of such information and data should be left to medically-trained personnel. Information provided to patient can be located elsewhere in the medical record under "Patient Instructions". Document created using STT-dictation technology, any transcriptional errors that may result from process are unintentional.    Patient: Jocelyn Wells  Service Category: E/M  Provider: Gillis Santa, MD  DOB: 12-30-1955  DOS: 08/21/2019  Specialty: Interventional Pain Management  MRN: [id number]  Setting: Ambulatory outpatient  PCP: Margo Common, PA  Type: Established Patient    Referring Provider: Margo Common, PA  Location: Office  Delivery: Face-to-face     HPI  Reason for encounter: [name], a 64 y.o. year old female, is here today for evaluation and management of her Compression fracture of body of thoracic vertebra ([place]) [S22.000A]. [name] primary complain today is Medication Refill Last encounter: Practice (08/15/2019). My last encounter with her was on 05/17/2019. Pertinent problems: [name] has H/O: osteoarthritis; Anxiety, generalized; Depression, major, recurrent, moderate ([place]); Diabetes mellitus type 2, uncontrolled ([place]); Arthritis, degenerative; Morbid obesity due to excess calories ([place]); Cerebrovascular accident (CVA) due to thrombosis of left middle cerebral artery ([place]); Cerebrovascular accident (CVA) due to bilateral thrombosis of carotid arteries ([place]); Type II diabetes mellitus with renal manifestations, uncontrolled ([place]); Compression fracture of L1 lumbar vertebra ([place]); Compression fracture of body of thoracic vertebra ([place]); Lumbar foraminal stenosis (RIGHT L1); Spinal stenosis of lumbar region with neurogenic claudication (L4-L5); and MDD (major depressive disorder), recurrent, in full remission ([place]) on their pertinent problem list. Pain  Assessment: Severity of Chronic pain is reported as a 7 /10. Location: Back Lower/When walking and standing radiates down to back of thight and knees. Onset: More than a month ago. Quality: Constant (constant when walking or standing). Timing: Intermittent. Modifying factor(s): Sitting. Vitals:  height is _0  (1.676 m) and weight is 216 lb (98 kg). Her oral temperature is 97.8 F (36.6 C). Her blood pressure is 124/81 and her pulse is 83. Her respiration is 18 and oxygen saturation is 96%.   No change in medical history since last visit.  Patient's pain is at baseline.  Patient continues multimodal pain regimen as prescribed.  States that it provides pain relief and improvement in functional status.  Pharmacotherapy Assessment   05/17/2019  1   05/17/2019  Tramadol Hcl 50 MG Tablet  90.00  30 Bi Lat   [id number]   Nor (4575)   0/0  15.00 MME  Medicaid   Jocelyn Wells      Monitoring: [place] PMP: PDMP reviewed during this encounter.       Pharmacotherapy: No side-effects or adverse reactions reported. Compliance: No problems identified. Effectiveness: Clinically acceptable.  Janne Napoleon, RN  08/21/2019 10:18 AM  Sign when Signing Visit Nursing Pain Medication Assessment:  Safety precautions to be maintained throughout the outpatient stay will include: orient to surroundings, keep bed in low position, maintain call bell within reach at all times, provide assistance with transfer out of bed and ambulation.  Medication Inspection Compliance: [name] did not comply with our request to bring her pills to be counted. She was reminded that bringing the medication bottles, even when empty, is a requirement.  Medication: None brought in. Pill/Patch Count: None available to be counted. Bottle Appearance: No container available. Did not bring bottle(s) to appointment. Filled Date: N/A Last Medication intake:  Ran out of medicine more than 48 hours  ago on 08/17/19.   Inform patient to bring bottle on next  visit. Pt verbalized understanding.   Safety precautions to be maintained throughout the outpatient stay will include: orient to surroundings, keep bed in low position, maintain call bell within reach at all times, provide assistance with transfer out of bed and ambulation.     UDS:  Summary  Date Value Ref Range Status  04/17/2019 Note  Final    Comment:    ==================================================================== Compliance Drug Analysis, Ur ==================================================================== Test                             Result       Flag       Units Drug Present   Tramadol                       >6410                   ng/mg creat   O-Desmethyltramadol            3904                    ng/mg creat   N-Desmethyltramadol            3140                    ng/mg creat    Source of tramadol is a prescription medication. O-desmethyltramadol    and N-desmethyltramadol are expected metabolites of tramadol.   Gabapentin                     PRESENT   Paroxetine                     PRESENT ==================================================================== Test                      Result    Flag   Units      Ref Range   Creatinine              78               mg/dL      >=20 ==================================================================== Declared Medications:  Medication list was not provided. ==================================================================== For clinical consultation, please call [phone]. ====================================================================      ROS  Constitutional: Denies any fever or chills Gastrointestinal: No reported hemesis, hematochezia, vomiting, or acute GI distress Musculoskeletal: Denies any acute onset joint swelling, redness, loss of ROM, or weakness Neurological: No reported episodes of acute onset apraxia, aphasia, dysarthria, agnosia, amnesia, paralysis, loss of coordination, or loss of  consciousness  Medication Review  Insulin Syringe-Needle U-100, PARoxetine, Semaglutide (1 MG/DOSE), Vitamin D (Ergocalciferol), apixaban, atorvastatin, calcipotriene, denosumab, gabapentin, glucose blood, glyBURIDE-metformin, hydrochlorothiazide, insulin NPH-regular Human, loratadine, losartan, mupirocin ointment, traMADol, and triamcinolone cream  History Review  Allergy: Ms. Imes is allergic to codeine. Drug: Ms. Matsushima  reports no history of drug use. Alcohol:  reports no history of alcohol use. Tobacco:  reports that she has been smoking cigarettes. She started smoking about 45 years ago. She has a 36.00 pack-year smoking history. She has never used smokeless tobacco. Social: Ms. Blasco  reports that she has been smoking cigarettes. She started smoking about 45 years ago. She has a 36.00 pack-year smoking history. She has never used smokeless tobacco. She reports that she does not drink  alcohol and does not use drugs. Medical:  has a past medical history of Anxiety, Cardiomyopathy ([place]), COPD (chronic obstructive pulmonary disease) ([place]), Depression, Diabetes mellitus, type II ([place]), HTN (hypertension), Obesity, Osteoporosis, PONV (postoperative nausea and vomiting), and Stroke ([place]). Surgical: Ms. Malicki  has a past surgical history that includes Abdominal hysterectomy; Tubal ligation (1978); Cesarean section (1978); Knee arthroscopy (Left, 2005); Ulnar nerve transposition (2008); Bilateral salpingoophorectomy (2000); Ankle fracture surgery (Left, 2002); TEE without cardioversion (N/A, 01/31/2015); Cardiac catheterization (N/A, 03/25/2015); Cardiac catheterization (N/A, 08/05/2015); Esophagogastroduodenoscopy (egd) with propofol (N/A, 09/22/2016); Colonoscopy with propofol (N/A, 09/22/2016); and Esophagogastroduodenoscopy (egd) with propofol (N/A, 12/08/2016). Family: family history includes Alcohol abuse in her paternal aunt; Aneurysm in her father; Asthma in her mother; COPD in her brother;  Diabetes in her brother and mother; Heart disease in her father and mother.  Laboratory Chemistry Profile   Renal Lab Results  Component Value Date   BUN 32 (H) 04/23/2019   CREATININE 1.82 (H) 04/23/2019   BCR 18 04/23/2019   GFRAA 33 (L) 04/23/2019   GFRNONAA 29 (L) 04/23/2019     Hepatic Lab Results  Component Value Date   AST 11 04/23/2019   ALT 13 04/23/2019   ALBUMIN 3.8 04/23/2019   ALKPHOS 89 04/23/2019     Electrolytes Lab Results  Component Value Date   NA 143 04/23/2019   K 5.2 04/23/2019   CL 107 (H) 04/23/2019   CALCIUM 8.7 04/23/2019     Bone No results found for: VD25OH, VD125OH2TOT, CL2751ZG0, FV4944HQ7, 25OHVITD1, 25OHVITD2, 25OHVITD3, TESTOFREE, TESTOSTERONE   Inflammation (CRP: Acute Phase) (ESR: Chronic Phase) No results found for: CRP, ESRSEDRATE, LATICACIDVEN     Note: Above Lab results reviewed.  Recent Imaging Review  CUP PACEART INCLINIC DEVICE CHECK Loop check in clinic. Battery status: EOS as of 03/14/19. No symptom, tachy, or brady episodes. 4 pause episodes--false, undersensing. 1 AF episode--false, intermittent undersensing of SR. Patient declines ILR explant. ROV with SK PRN.Levander Campion BSN,  RN, CCDS Note: Reviewed        Physical Exam  General appearance: Well nourished, well developed, and well hydrated. In no apparent acute distress Mental status: Alert, oriented x 3 (person, place, & time)       Respiratory: No evidence of acute respiratory distress Eyes: PERLA Vitals: BP 124/81    Pulse 83    Temp 97.8 F (36.6 C) (Oral)    Resp 18    Ht _0  (1.676 m)    Wt 216 lb (98 kg)    SpO2 96%    BMI 34.86 kg/m  BMI: Estimated body mass index is 34.86 kg/m as calculated from the following:   Height as of this encounter: _1  (1.676 m).   Weight as of this encounter: 216 lb (98 kg). Ideal: Ideal body weight: 59.3 kg (130 lb 11.7 oz) Adjusted ideal body weight: 74.8 kg (164 lb 13.4 oz)  Lumbar Spine Area Exam  Skin & Axial  Inspection: No masses, redness, or swelling Alignment: Symmetrical Functional ROM: Pain restricted ROM affecting both sides Stability: No instability detected Muscle Tone/Strength: Functionally intact. No obvious neuro-muscular anomalies detected. Sensory (Neurological): Dermatomal pain pattern and neurogenic Palpation: No palpable anomalies       Provocative Tests: Hyperextension/rotation test: (+) bilaterally for facet joint pain. Lumbar quadrant test (Kemp's test): (+) bilateral for foraminal stenosis  Gait & Posture Assessment  Ambulation: Patient ambulates using a cane Gait: Significantly limited. Dependent on assistive device to ambulate Posture: Difficulty standing up straight, due  to pain  Lower Extremity Exam    Side: Right lower extremity  Side: Left lower extremity  Stability: No instability observed          Stability: No instability observed          Skin & Extremity Inspection: Skin color, temperature, and hair growth are WNL. No peripheral edema or cyanosis. No masses, redness, swelling, asymmetry, or associated skin lesions. No contractures.  Skin & Extremity Inspection: Skin color, temperature, and hair growth are WNL. No peripheral edema or cyanosis. No masses, redness, swelling, asymmetry, or associated skin lesions. No contractures.  Functional ROM: Pain restricted ROM for hip and knee joints          Functional ROM: Pain restricted ROM for hip and knee joints          Muscle Tone/Strength: Functionally intact. No obvious neuro-muscular anomalies detected.  Muscle Tone/Strength: Functionally intact. No obvious neuro-muscular anomalies detected.  Sensory (Neurological): Musculoskeletal pain pattern        Sensory (Neurological): Musculoskeletal pain pattern        DTR: Patellar: deferred today Achilles: deferred today Plantar: deferred today  DTR: Patellar: deferred today Achilles: deferred today Plantar: deferred today  Palpation: No palpable anomalies  Palpation:  No palpable anomalies    Assessment   Status Diagnosis  Persistent Persistent Persistent 1. Compression fracture of body of thoracic vertebra (HCC)   2. Lumbar foraminal stenosis (RIGHT L1)   3. Spinal stenosis of lumbar region with neurogenic claudication (L4-L5)   4. Chronic pain syndrome   5. Compression fracture of L1 vertebra, sequela   6. Type II diabetes mellitus with renal manifestations, uncontrolled ([place])   7. Cerebrovascular accident (CVA) due to bilateral thrombosis of carotid arteries ([place])   8. Cerebrovascular accident (CVA) due to thrombosis of left middle cerebral artery ([place])   9. Morbid obesity due to excess calories ([place])      Updated Problems: Problem  Mdd (Major Depressive Disorder), Recurrent, in Full Remission (Hcc)  Compression Fracture of L1 Lumbar Vertebra (Hcc)  Compression Fracture of Body of Thoracic Vertebra (Hcc)  Lumbar foraminal stenosis (RIGHT L1)  Spinal stenosis of lumbar region with neurogenic claudication (L4-L5)  Cerebrovascular Accident (Cva) Due to Bilateral Thrombosis of Carotid Arteries (Hcc)  Morbid Obesity Due to Excess Calories (Hcc)  Cerebrovascular Accident (Cva) Due to Thrombosis of Left Middle Cerebral Artery (Hcc)  H/O: Osteoarthritis  Anxiety, Generalized  Depression, major, recurrent, moderate (HCC)  Type II Diabetes Mellitus With Renal Manifestations, Uncontrolled (Hcc)  Diabetes Mellitus Type 2, Uncontrolled (Hcc)  Arthritis, Degenerative    Plan of Care  [name] has a current medication list which includes the following long-term medication(s): apixaban, atorvastatin, gabapentin, glyburide-metformin, hydrochlorothiazide, loratadine, losartan, novolin 70/30 relion, and paroxetine.  Pharmacotherapy (Medications Ordered): Meds ordered this encounter  Medications   traMADol (ULTRAM) 50 MG tablet    Sig: Take 1 tablet (50 mg total) by mouth every 12 (twelve) hours as needed.    Dispense:  60 tablet    Refill:   2   Follow-up plan:   Return in about 3 months (around 11/21/2019) for Medication Management, in person.   Recent Visits No visits were found meeting these conditions. Showing recent visits within past 90 days and meeting all other requirements Today's Visits Date Type Provider Dept  08/21/19 Office Visit Gillis Santa, MD [facility]  Showing today's visits and meeting all other requirements Future Appointments Date Type Provider Dept  11/15/19 Appointment Gillis Santa,  MD [facility]  Showing future appointments within next 90 days and meeting all other requirements  I discussed the assessment and treatment plan with the patient. The patient was provided an opportunity to ask questions and all were answered. The patient agreed with the plan and demonstrated an understanding of the instructions.  Patient advised to call back or seek an in-person evaluation if the symptoms or condition worsens.  Duration of encounter: 30 minutes.  Note by: Gillis Santa, MD Date: 08/21/2019; Time: 11:23 AM

## 2019-08-28 DIAGNOSIS — Z794 Long term (current) use of insulin: Secondary | ICD-10-CM | POA: Diagnosis not present

## 2019-08-28 DIAGNOSIS — R809 Proteinuria, unspecified: Secondary | ICD-10-CM | POA: Diagnosis not present

## 2019-08-28 DIAGNOSIS — E1122 Type 2 diabetes mellitus with diabetic chronic kidney disease: Secondary | ICD-10-CM | POA: Diagnosis not present

## 2019-08-28 DIAGNOSIS — M810 Age-related osteoporosis without current pathological fracture: Secondary | ICD-10-CM | POA: Diagnosis not present

## 2019-08-28 DIAGNOSIS — E1121 Type 2 diabetes mellitus with diabetic nephropathy: Secondary | ICD-10-CM | POA: Diagnosis not present

## 2019-08-28 DIAGNOSIS — E113393 Type 2 diabetes mellitus with moderate nonproliferative diabetic retinopathy without macular edema, bilateral: Secondary | ICD-10-CM | POA: Diagnosis not present

## 2019-08-28 DIAGNOSIS — N183 Chronic kidney disease, stage 3 unspecified: Secondary | ICD-10-CM | POA: Diagnosis not present

## 2019-08-28 DIAGNOSIS — E1129 Type 2 diabetes mellitus with other diabetic kidney complication: Secondary | ICD-10-CM | POA: Diagnosis not present

## 2019-08-28 DIAGNOSIS — E1142 Type 2 diabetes mellitus with diabetic polyneuropathy: Secondary | ICD-10-CM | POA: Diagnosis not present

## 2019-08-28 DIAGNOSIS — E1159 Type 2 diabetes mellitus with other circulatory complications: Secondary | ICD-10-CM | POA: Diagnosis not present

## 2019-08-28 DIAGNOSIS — F17200 Nicotine dependence, unspecified, uncomplicated: Secondary | ICD-10-CM | POA: Diagnosis not present

## 2019-08-28 DIAGNOSIS — E1165 Type 2 diabetes mellitus with hyperglycemia: Secondary | ICD-10-CM | POA: Diagnosis not present

## 2019-09-11 DIAGNOSIS — N183 Chronic kidney disease, stage 3 unspecified: Secondary | ICD-10-CM | POA: Diagnosis not present

## 2019-09-11 DIAGNOSIS — R809 Proteinuria, unspecified: Secondary | ICD-10-CM | POA: Diagnosis not present

## 2019-09-11 DIAGNOSIS — F17200 Nicotine dependence, unspecified, uncomplicated: Secondary | ICD-10-CM | POA: Diagnosis not present

## 2019-09-11 DIAGNOSIS — Z794 Long term (current) use of insulin: Secondary | ICD-10-CM | POA: Diagnosis not present

## 2019-09-11 DIAGNOSIS — E1159 Type 2 diabetes mellitus with other circulatory complications: Secondary | ICD-10-CM | POA: Diagnosis not present

## 2019-09-11 DIAGNOSIS — E1129 Type 2 diabetes mellitus with other diabetic kidney complication: Secondary | ICD-10-CM | POA: Diagnosis not present

## 2019-09-11 DIAGNOSIS — E113393 Type 2 diabetes mellitus with moderate nonproliferative diabetic retinopathy without macular edema, bilateral: Secondary | ICD-10-CM | POA: Diagnosis not present

## 2019-09-11 DIAGNOSIS — I10 Essential (primary) hypertension: Secondary | ICD-10-CM | POA: Diagnosis not present

## 2019-09-11 DIAGNOSIS — E1121 Type 2 diabetes mellitus with diabetic nephropathy: Secondary | ICD-10-CM | POA: Diagnosis not present

## 2019-09-11 DIAGNOSIS — E1142 Type 2 diabetes mellitus with diabetic polyneuropathy: Secondary | ICD-10-CM | POA: Diagnosis not present

## 2019-09-11 DIAGNOSIS — E1122 Type 2 diabetes mellitus with diabetic chronic kidney disease: Secondary | ICD-10-CM | POA: Diagnosis not present

## 2019-09-11 DIAGNOSIS — M810 Age-related osteoporosis without current pathological fracture: Secondary | ICD-10-CM | POA: Diagnosis not present

## 2019-09-11 DIAGNOSIS — E1165 Type 2 diabetes mellitus with hyperglycemia: Secondary | ICD-10-CM | POA: Diagnosis not present

## 2019-09-18 ENCOUNTER — Telehealth (HOSPITAL_COMMUNITY): Payer: Self-pay | Admitting: *Deleted

## 2019-09-18 DIAGNOSIS — F3342 Major depressive disorder, recurrent, in full remission: Secondary | ICD-10-CM

## 2019-09-18 MED ORDER — PAROXETINE HCL 40 MG PO TABS: 40.0000 mg | ORAL_TABLET | Freq: Every day | ORAL | 0 refills | Status: DC

## 2019-09-18 NOTE — Telephone Encounter (Signed)
30 day prescription sent to pharmacy.

## 2019-09-18 NOTE — Addendum Note (Signed)
Addended by: [place] Crane on: 09/18/2019 02:37 PM   Modules accepted: Orders

## 2019-09-18 NOTE — Telephone Encounter (Signed)
Patient called requested refill on  PARoxetine (PAXIL) 40 MG tablet. Patient states that she has 2 pills left. L..O.V. 06/29/19  Patient has been remind of the No Show Policy  After seeing quite a few cancelled appointments  w/out rescheduled & that no further refills will be honored until appointment has been met. Appt scheduled 09/11/19

## 2019-10-02 DIAGNOSIS — Z9119 Patient's noncompliance with other medical treatment and regimen: Secondary | ICD-10-CM | POA: Diagnosis not present

## 2019-10-02 DIAGNOSIS — E1159 Type 2 diabetes mellitus with other circulatory complications: Secondary | ICD-10-CM | POA: Diagnosis not present

## 2019-10-02 DIAGNOSIS — E1129 Type 2 diabetes mellitus with other diabetic kidney complication: Secondary | ICD-10-CM | POA: Diagnosis not present

## 2019-10-02 DIAGNOSIS — R809 Proteinuria, unspecified: Secondary | ICD-10-CM | POA: Diagnosis not present

## 2019-10-02 DIAGNOSIS — Z794 Long term (current) use of insulin: Secondary | ICD-10-CM | POA: Diagnosis not present

## 2019-10-02 DIAGNOSIS — E1121 Type 2 diabetes mellitus with diabetic nephropathy: Secondary | ICD-10-CM | POA: Diagnosis not present

## 2019-10-02 DIAGNOSIS — N183 Chronic kidney disease, stage 3 unspecified: Secondary | ICD-10-CM | POA: Diagnosis not present

## 2019-10-02 DIAGNOSIS — E1142 Type 2 diabetes mellitus with diabetic polyneuropathy: Secondary | ICD-10-CM | POA: Diagnosis not present

## 2019-10-02 DIAGNOSIS — M810 Age-related osteoporosis without current pathological fracture: Secondary | ICD-10-CM | POA: Diagnosis not present

## 2019-10-02 DIAGNOSIS — E1165 Type 2 diabetes mellitus with hyperglycemia: Secondary | ICD-10-CM | POA: Diagnosis not present

## 2019-10-02 DIAGNOSIS — E1122 Type 2 diabetes mellitus with diabetic chronic kidney disease: Secondary | ICD-10-CM | POA: Diagnosis not present

## 2019-10-02 DIAGNOSIS — E113393 Type 2 diabetes mellitus with moderate nonproliferative diabetic retinopathy without macular edema, bilateral: Secondary | ICD-10-CM | POA: Diagnosis not present

## 2019-10-09 ENCOUNTER — Other Ambulatory Visit: Payer: Self-pay

## 2019-10-09 ENCOUNTER — Telehealth (INDEPENDENT_AMBULATORY_CARE_PROVIDER_SITE_OTHER): Payer: Medicare Other | Admitting: Psychiatry

## 2019-10-09 ENCOUNTER — Encounter (HOSPITAL_COMMUNITY): Payer: Self-pay | Admitting: Psychiatry

## 2019-10-09 DIAGNOSIS — F3342 Major depressive disorder, recurrent, in full remission: Secondary | ICD-10-CM | POA: Diagnosis not present

## 2019-10-09 MED ORDER — PAROXETINE HCL 40 MG PO TABS: 40.0000 mg | ORAL_TABLET | Freq: Every day | ORAL | 1 refills | Status: DC

## 2019-10-09 NOTE — Progress Notes (Signed)
[place] MD OP Progress Note  Virtual Visit via Telephone Note  I connected with [name] on 10/09/19 at  3:20 PM EDT by telephone and verified that I am speaking with the correct person using two identifiers.  Location: Patient: home Provider: Clinic   I discussed the limitations, risks, security and privacy concerns of performing an evaluation and management service by telephone and the availability of in person appointments. I also discussed with the patient that there may be a patient responsible charge related to this service. The patient expressed understanding and agreed to proceed.   I provided 12 minutes of non-face-to-face time during this encounter.   10/09/2019 3:27 PM [name]  MRN:  [id number]  Chief Complaint: " Everything is fine."  HPI: Pt informed that she continues to do well.  She stated her mood has been stable.  She stated that if she has to miss a dose here and there she can tell a big difference.  She informed that her family is doing well too and everything is stable.  She denied any recent events or incidents in her life.  She denied any other complaints.  Visit Diagnosis:    ICD-10-CM   1. MDD (major depressive disorder), recurrent, in full remission ([place])  F33.42     Past Psychiatric History: MDD  Past Medical History:  Past Medical History:  Diagnosis Date  . Anxiety   . Cardiomyopathy [facility])    new to her Jan 2017  . COPD (chronic obstructive pulmonary disease) ([place])   . Depression   . Diabetes mellitus, type II ([place])   . HTN (hypertension)   . Obesity   . Osteoporosis   . PONV (postoperative nausea and vomiting)   . Stroke [facility])    Jan 2017    Past Surgical History:  Procedure Laterality Date  . ABDOMINAL HYSTERECTOMY    . ANKLE FRACTURE SURGERY Left 2002  . BILATERAL SALPINGOOPHORECTOMY  2000  . CARDIAC CATHETERIZATION N/A 03/25/2015   Procedure: Right/Left Heart Cath and Coronary Angiography;  Surgeon: Belva Crome, MD;   Location: [facility] CV LAB;  Service: Cardiovascular;  Laterality: N/A;  . [place]  . COLONOSCOPY WITH PROPOFOL N/A 09/22/2016   Procedure: COLONOSCOPY WITH PROPOFOL;  Surgeon: Manya Silvas, MD;  Location: [facility] ENDOSCOPY;  Service: Endoscopy;  Laterality: N/A;  . EP IMPLANTABLE DEVICE N/A 08/05/2015   Procedure: Loop Recorder Insertion;  Surgeon: Deboraha Sprang, MD;  Location: [place] CV LAB;  Service: Cardiovascular;  Laterality: N/A;  . ESOPHAGOGASTRODUODENOSCOPY (EGD) WITH PROPOFOL N/A 09/22/2016   Procedure: ESOPHAGOGASTRODUODENOSCOPY (EGD) WITH PROPOFOL;  Surgeon: Manya Silvas, MD;  Location: [facility] ENDOSCOPY;  Service: Endoscopy;  Laterality: N/A;  . ESOPHAGOGASTRODUODENOSCOPY (EGD) WITH PROPOFOL N/A 12/08/2016   Procedure: ESOPHAGOGASTRODUODENOSCOPY (EGD) WITH PROPOFOL;  Surgeon: Manya Silvas, MD;  Location: [facility] ENDOSCOPY;  Service: Endoscopy;  Laterality: N/A;  . KNEE ARTHROSCOPY Left 2005  . TEE WITHOUT CARDIOVERSION N/A 01/31/2015   Procedure: TRANSESOPHAGEAL ECHOCARDIOGRAM (TEE);  Surgeon: Lelon Perla, MD;  Location: [place];  Service: Cardiovascular;  Laterality: N/A;  . [place]  . ULNAR NERVE TRANSPOSITION  2008    Family Psychiatric History: see below  Family History:  Family History  Problem Relation Age of Onset  . Heart disease Mother        died from CHF  . Asthma Mother   . Diabetes Mother   . Heart disease Father   . Aneurysm Father   .  COPD Brother   . Diabetes Brother   . Alcohol abuse Paternal Aunt   . Anemia Neg Hx   . Arrhythmia Neg Hx   . Clotting disorder Neg Hx   . Fainting Neg Hx   . Heart attack Neg Hx   . Heart failure Neg Hx   . Hyperlipidemia Neg Hx   . Hypertension Neg Hx     Social History:  Social History   Socioeconomic History  . Marital status: Married    Spouse name: Not on file  . Number of children: 2  . Years of education: Not on file  . Highest education level:  Bachelor's degree (e.g., BA, AB, BS)  Occupational History  . Occupation: disabled    Comment: retired  Tobacco Use  . Smoking status: Current Every Day Smoker    Packs/day: 1.00    Years: 36.00    Pack years: 36.00    Types: Cigarettes    Start date: 02/08/1974  . Smokeless tobacco: Never Used  . Tobacco comment: "I can not quit smoking, I have tried". 1 PPD  Vaping Use  . Vaping Use: Never used  Substance and Sexual Activity  . Alcohol use: No    Alcohol/week: 0.0 standard drinks  . Drug use: No  . Sexual activity: Yes    Birth control/protection: None  Other Topics Concern  . Not on file  Social History Narrative   Lives at home with stepson and husband, dogs and cats   Caffeine  Drinks sweet tea.   Right handed.    Social Determinants of Health   Financial Resource Strain:   . Difficulty of Paying Living Expenses: Not on file  Food Insecurity:   . Worried About Charity fundraiser in the Last Year: Not on file  . Ran Out of Food in the Last Year: Not on file  Transportation Needs:   . Lack of Transportation (Medical): Not on file  . Lack of Transportation (Non-Medical): Not on file  Physical Activity:   . Days of Exercise per Week: Not on file  . Minutes of Exercise per Session: Not on file  Stress:   . Feeling of Stress : Not on file  Social Connections:   . Frequency of Communication with Friends and Family: Not on file  . Frequency of Social Gatherings with Friends and Family: Not on file  . Attends Religious Services: Not on file  . Active Member of Clubs or Organizations: Not on file  . Attends Archivist Meetings: Not on file  . Marital Status: Not on file    Allergies:  Allergies  Allergen Reactions  . Codeine Nausea And Vomiting    Metabolic Disorder Labs: Lab Results  Component Value Date   HGBA1C 11.4 03/28/2019   MPG 303.44 10/06/2017   MPG 194 01/30/2015   No results found for: PROLACTIN Lab Results  Component Value Date    CHOL 132 04/23/2019   TRIG 175 (H) 04/23/2019   HDL 43 04/23/2019   CHOLHDL 3.1 04/23/2019   VLDL 64 (H) 10/06/2017   LDLCALC 60 04/23/2019   LDLCALC 88 10/06/2017   No results found for: TSH  Therapeutic Level Labs: No results found for: LITHIUM No results found for: VALPROATE No components found for:  CBMZ  Current Medications: Current Outpatient Medications  Medication Sig Dispense Refill  . apixaban (ELIQUIS) 2.5 MG TABS tablet Take 1 tablet (2.5 mg total) by mouth 2 (two) times daily. 60 tablet 11  . atorvastatin (  LIPITOR) 40 MG tablet Take 1 tablet (40 mg total) by mouth daily. 90 tablet 3  . B-D INS SYRINGE 0.5CC/31GX5/16 31G X 5/16" 0.5 ML MISC     . calcipotriene (DOVONOX) 0.005 % cream Apply topically as needed. For psoriasis flares    . denosumab (PROLIA) 60 MG/ML SOSY injection Inject 60 mg into the skin every 6 (six) months.    . gabapentin (NEURONTIN) 300 MG capsule Take 1 capsule (300 mg total) by mouth daily. (Patient taking differently: Take 300 mg by mouth 2 (two) times daily. ) 90 capsule 3  . glucose blood test strip OneTouch Verio strips    . glyBURIDE-metformin (GLUCOVANCE) 5-500 MG per tablet Take 2 tablets by mouth 2 (two) times daily.     . hydrochlorothiazide (HYDRODIURIL) 25 MG tablet Take 25 mg by mouth daily.    Marland Kitchen loratadine (CLARITIN) 10 MG tablet Take 1 tablet by mouth daily.    Marland Kitchen losartan (COZAAR) 25 MG tablet Take 2 tablets (50 mg total) by mouth daily. 180 tablet 3  . mupirocin ointment (BACTROBAN) 2 % mupirocin 2 % topical ointment    . NOVOLIN 70/30 RELION (70-30) 100 UNIT/ML injection Inject 10-50 Units into the skin 2 (two) times daily with a meal. 10units-AM,  40-50units-PM    . OZEMPIC, 1 MG/DOSE, 2 MG/1.5ML SOPN     . PARoxetine (PAXIL) 40 MG tablet Take 1 tablet (40 mg total) by mouth daily. 30 tablet 0  . Semaglutide, 1 MG/DOSE, 2 MG/1.5ML SOPN Inject into the skin.    Marland Kitchen traMADol (ULTRAM) 50 MG tablet Take 1 tablet (50 mg total) by mouth  every 12 (twelve) hours as needed. 60 tablet 2  . triamcinolone cream (KENALOG) 0.1 % Apply 1 application topically as needed (for psoriasis flares).     . Vitamin D, Ergocalciferol, (DRISDOL) 1.25 MG (50000 UT) CAPS capsule Take 50,000 Units by mouth once a week.     No current facility-administered medications for this visit.    Musculoskeletal: Strength & Muscle Tone: unable to assess due to telemed visit Gait & Station: unable to assess due to telemed visit Patient leans: unable to assess due to telemed visit   Psychiatric Specialty Exam: Review of Systems  There were no vitals taken for this visit.There is no height or weight on file to calculate BMI.  General Appearance: unable to assess due to phone visit  Eye Contact:  unable to assess due to phone visit  Speech:  Clear and Coherent and Normal Rate  Volume:  Normal  Mood:  Euthymic  Affect:  Congruent  Thought Process:  Goal Directed, Linear and Descriptions of Associations: Intact  Orientation:  Full (Time, Place, and Person)  Thought Content: Logical   Suicidal Thoughts:  No  Homicidal Thoughts:  No  Memory:  Recent;   Good Remote;   Good  Judgement:  Fair  Insight:  Fair  Psychomotor Activity:  Normal  Concentration:  Concentration: Good and Attention Span: Good  Recall:  Good  Fund of Knowledge: Good  Language: Good  Akathisia:  Negative  Handed:  Right  AIMS (if indicated): not done  Assets:  Communication Skills Desire for Improvement Financial Resources/Insurance Housing  ADL's:  Intact  Cognition: WNL  Sleep:  Fair     Screenings: PHQ2-9     Office Visit from 08/21/2019 in [place] from 04/10/2018 in [place] from 12/26/2017 in [facility] Office Visit from 11/15/2017  in [place] from 03/17/2017 in [facility]  PHQ-2 Total Score 0 2 0 0 2   PHQ-9 Total Score -- 6 -- -- 9       Assessment and Plan: This is a 64 year old female with history of MDD and other medical comorbidities.  Patient seems to be stable on her current regimen.  1. MDD (major depressive disorder), recurrent, in full remission ([place]) - Continue Paxil 40 mg daily.  F/up in 4 months.   [place] Crane, MD 10/09/2019, 3:27 PM

## 2019-10-22 DIAGNOSIS — E113393 Type 2 diabetes mellitus with moderate nonproliferative diabetic retinopathy without macular edema, bilateral: Secondary | ICD-10-CM | POA: Diagnosis not present

## 2019-10-22 LAB — HM DIABETES EYE EXAM

## 2019-11-13 DIAGNOSIS — Z794 Long term (current) use of insulin: Secondary | ICD-10-CM | POA: Diagnosis not present

## 2019-11-13 DIAGNOSIS — E1142 Type 2 diabetes mellitus with diabetic polyneuropathy: Secondary | ICD-10-CM | POA: Diagnosis not present

## 2019-11-13 DIAGNOSIS — I10 Essential (primary) hypertension: Secondary | ICD-10-CM | POA: Diagnosis not present

## 2019-11-13 DIAGNOSIS — F17200 Nicotine dependence, unspecified, uncomplicated: Secondary | ICD-10-CM | POA: Diagnosis not present

## 2019-11-13 DIAGNOSIS — R809 Proteinuria, unspecified: Secondary | ICD-10-CM | POA: Diagnosis not present

## 2019-11-13 DIAGNOSIS — E1129 Type 2 diabetes mellitus with other diabetic kidney complication: Secondary | ICD-10-CM | POA: Diagnosis not present

## 2019-11-13 DIAGNOSIS — M810 Age-related osteoporosis without current pathological fracture: Secondary | ICD-10-CM | POA: Diagnosis not present

## 2019-11-13 DIAGNOSIS — E1121 Type 2 diabetes mellitus with diabetic nephropathy: Secondary | ICD-10-CM | POA: Diagnosis not present

## 2019-11-13 DIAGNOSIS — E113393 Type 2 diabetes mellitus with moderate nonproliferative diabetic retinopathy without macular edema, bilateral: Secondary | ICD-10-CM | POA: Diagnosis not present

## 2019-11-13 DIAGNOSIS — E1122 Type 2 diabetes mellitus with diabetic chronic kidney disease: Secondary | ICD-10-CM | POA: Diagnosis not present

## 2019-11-13 DIAGNOSIS — E1159 Type 2 diabetes mellitus with other circulatory complications: Secondary | ICD-10-CM | POA: Diagnosis not present

## 2019-11-13 DIAGNOSIS — N183 Chronic kidney disease, stage 3 unspecified: Secondary | ICD-10-CM | POA: Diagnosis not present

## 2019-11-13 DIAGNOSIS — E1165 Type 2 diabetes mellitus with hyperglycemia: Secondary | ICD-10-CM | POA: Diagnosis not present

## 2019-11-15 ENCOUNTER — Encounter: Payer: Medicare Other | Admitting: Student in an Organized Health Care Education/Training Program

## 2019-11-28 ENCOUNTER — Telehealth: Payer: Self-pay | Admitting: Family Medicine

## 2019-11-28 NOTE — Telephone Encounter (Signed)
Copied from [place] [phone] [id number]. Topic: Medicare AWV >> Nov 28, 2019 12:13 PM Cher Nakai R wrote: Reason for CRM:  Left message for patient to call back and schedule Medicare Annual Wellness Visit (AWV) either virtually or in office.  Last AWV 04/10/2018  Please schedule at anytime with [facility] Health Advisor.  If any questions, please contact me at [phone]

## 2019-11-30 ENCOUNTER — Telehealth (HOSPITAL_COMMUNITY): Payer: Self-pay

## 2019-12-01 ENCOUNTER — Other Ambulatory Visit (HOSPITAL_COMMUNITY): Payer: Self-pay | Admitting: Family

## 2019-12-01 ENCOUNTER — Encounter: Payer: Self-pay | Admitting: Oncology

## 2019-12-01 ENCOUNTER — Telehealth: Payer: Self-pay | Admitting: Oncology

## 2019-12-01 DIAGNOSIS — Z20822 Contact with and (suspected) exposure to covid-19: Secondary | ICD-10-CM

## 2019-12-01 NOTE — Progress Notes (Signed)
I connected by phone with Jocelyn Wells on 12/01/2019 at 8:25 PM to discuss the potential use of a new treatment for mild to moderate COVID-19 viral infection in non-hospitalized patients.  This patient is a 64 y.o. female that meets the FDA criteria for Emergency Use Authorization of COVID monoclonal antibody casirivimab/imdevimab or bamlanivimab/eteseviamb.  Is high risk and lives with person with  a (+) direct SARS-CoV-2 viral test result  Has mild or moderate COVID-19 symptoms   Is NOT hospitalized due to COVID-19  Is within 10 days of symptom onset  Has at least one of the high risk factor(s) for progression to severe COVID-19 and/or hospitalization as defined in EUA.  Specific high risk criteria : Older age (>/= 65 yo), BMI > 25, Diabetes, Immunosuppressive Disease or Treatment and Chronic Lung Disease   Symptoms of cough, weakness, loss of smell began 10/20.    I have spoken and communicated the following to the patient or parent/caregiver regarding COVID monoclonal antibody treatment:  1. FDA has authorized the emergency use for the treatment of mild to moderate COVID-19 in adults and pediatric patients with positive results of direct SARS-CoV-2 viral testing who are 70 years of age and older weighing at least 40 kg, and who are at high risk for progressing to severe COVID-19 and/or hospitalization.  2. The significant known and potential risks and benefits of COVID monoclonal antibody, and the extent to which such potential risks and benefits are unknown.  3. Information on available alternative treatments and the risks and benefits of those alternatives, including clinical trials.  4. Patients treated with COVID monoclonal antibody should continue to self-isolate and use infection control measures (e.g., wear mask, isolate, social distance, avoid sharing personal items, clean and disinfect "high touch" surfaces, and frequent handwashing) according to CDC guidelines.   5. The  patient or parent/caregiver has the option to accept or refuse COVID monoclonal antibody treatment.  After reviewing this information with the patient, the patient has agreed to receive one of the available covid 19 monoclonal antibodies and will be provided an appropriate fact sheet prior to infusion. Asencion Gowda, NP 12/01/2019 8:25 PM

## 2019-12-01 NOTE — Telephone Encounter (Signed)
Re: [facility]fusion  Called to Discuss with patient about Covid symptoms and the use of regeneron, a monoclonal antibody infusion for those with mild to moderate Covid symptoms and at a high risk of hospitalization.     Pt is qualified for this infusion at the [facility] infusion center due to co-morbid conditions and/or a member of an at-risk group.    Past Medical History:  Diagnosis Date  . Anxiety   . Cardiomyopathy [facility])    new to her Jan 2017  . COPD (chronic obstructive pulmonary disease) ([place])   . Depression   . Diabetes mellitus, type II ([place])   . HTN (hypertension)   . Obesity   . Osteoporosis   . PONV (postoperative nausea and vomiting)   . Stroke [facility])    Jan 2017    Specific risk condition-age, htn, dm   Unable to reach pt. Left VM and MCM.  Rulon Abide, AGNP-C [phone] ([place])

## 2019-12-02 ENCOUNTER — Ambulatory Visit (HOSPITAL_COMMUNITY)
Admission: RE | Admit: 2019-12-02 | Discharge: 2019-12-02 | Disposition: A | Discharge status: Home / Self Care | Payer: Medicare Other | Source: Ambulatory Visit | Attending: Pulmonary Disease | Admitting: Pulmonary Disease

## 2019-12-02 DIAGNOSIS — Z23 Encounter for immunization: Secondary | ICD-10-CM | POA: Diagnosis not present

## 2019-12-02 DIAGNOSIS — U071 COVID-19: Secondary | ICD-10-CM | POA: Diagnosis not present

## 2019-12-02 DIAGNOSIS — E119 Type 2 diabetes mellitus without complications: Secondary | ICD-10-CM | POA: Insufficient documentation

## 2019-12-02 DIAGNOSIS — J989 Respiratory disorder, unspecified: Secondary | ICD-10-CM | POA: Diagnosis not present

## 2019-12-02 DIAGNOSIS — R54 Age-related physical debility: Secondary | ICD-10-CM | POA: Diagnosis not present

## 2019-12-02 DIAGNOSIS — I10 Essential (primary) hypertension: Secondary | ICD-10-CM | POA: Diagnosis not present

## 2019-12-02 MED ORDER — ALBUTEROL SULFATE HFA 108 (90 BASE) MCG/ACT IN AERS: 2.0000 | INHALATION_SPRAY | Freq: Once | RESPIRATORY_TRACT | Status: DC | PRN

## 2019-12-02 MED ORDER — METHYLPREDNISOLONE SODIUM SUCC 125 MG IJ SOLR: 125.0000 mg | Freq: Once | INTRAMUSCULAR | Status: DC | PRN

## 2019-12-02 MED ORDER — SODIUM CHLORIDE 0.9 % IV SOLN: Freq: Once | INTRAVENOUS | Status: AC

## 2019-12-02 MED ORDER — DIPHENHYDRAMINE HCL 50 MG/ML IJ SOLN: 50.0000 mg | Freq: Once | INTRAMUSCULAR | Status: DC | PRN

## 2019-12-02 MED ORDER — SODIUM CHLORIDE 0.9 % IV SOLN: INTRAVENOUS | Status: DC | PRN

## 2019-12-02 MED ORDER — FAMOTIDINE IN NACL 20-0.9 MG/50ML-% IV SOLN: 20.0000 mg | Freq: Once | INTRAVENOUS | Status: DC | PRN

## 2019-12-02 MED ORDER — EPINEPHRINE 0.3 MG/0.3ML IJ SOAJ: 0.3000 mg | Freq: Once | INTRAMUSCULAR | Status: DC | PRN

## 2019-12-02 NOTE — Discharge Instructions (Signed)

## 2019-12-02 NOTE — Progress Notes (Signed)
  Diagnosis: COVID-19  Physician: Dr Wright Procedure: Covid Infusion Clinic Med: bamlanivimab\etesevimab infusion - Provided patient with bamlanimivab\etesevimab fact sheet for patients, parents and caregivers prior to infusion.  Complications: No immediate complications noted.  Discharge: Discharged home   Tracker Mance L 12/02/2019  

## 2019-12-06 ENCOUNTER — Encounter: Payer: Self-pay | Admitting: Student in an Organized Health Care Education/Training Program

## 2019-12-06 ENCOUNTER — Other Ambulatory Visit: Payer: Self-pay

## 2019-12-06 ENCOUNTER — Ambulatory Visit
Payer: Medicare Other | Attending: Student in an Organized Health Care Education/Training Program | Admitting: Student in an Organized Health Care Education/Training Program

## 2019-12-06 VITALS — BP 130/76 | HR 77 | Temp 97.2°F | Resp 16 | Ht 66.0 in | Wt 216.0 lb

## 2019-12-06 DIAGNOSIS — S22000A Wedge compression fracture of unspecified thoracic vertebra, initial encounter for closed fracture: Secondary | ICD-10-CM | POA: Insufficient documentation

## 2019-12-06 DIAGNOSIS — G894 Chronic pain syndrome: Secondary | ICD-10-CM | POA: Insufficient documentation

## 2019-12-06 DIAGNOSIS — I63033 Cerebral infarction due to thrombosis of bilateral carotid arteries: Secondary | ICD-10-CM | POA: Diagnosis not present

## 2019-12-06 DIAGNOSIS — M48062 Spinal stenosis, lumbar region with neurogenic claudication: Secondary | ICD-10-CM | POA: Insufficient documentation

## 2019-12-06 DIAGNOSIS — E1165 Type 2 diabetes mellitus with hyperglycemia: Secondary | ICD-10-CM | POA: Diagnosis not present

## 2019-12-06 DIAGNOSIS — IMO0002 Reserved for concepts with insufficient information to code with codable children: Secondary | ICD-10-CM

## 2019-12-06 DIAGNOSIS — M48061 Spinal stenosis, lumbar region without neurogenic claudication: Secondary | ICD-10-CM | POA: Diagnosis not present

## 2019-12-06 DIAGNOSIS — I63312 Cerebral infarction due to thrombosis of left middle cerebral artery: Secondary | ICD-10-CM | POA: Insufficient documentation

## 2019-12-06 DIAGNOSIS — E1129 Type 2 diabetes mellitus with other diabetic kidney complication: Secondary | ICD-10-CM | POA: Insufficient documentation

## 2019-12-06 DIAGNOSIS — S32010S Wedge compression fracture of first lumbar vertebra, sequela: Secondary | ICD-10-CM

## 2019-12-06 MED ORDER — TRAMADOL HCL 50 MG PO TABS: 50.0000 mg | ORAL_TABLET | Freq: Two times a day (BID) | ORAL | 2 refills | Status: DC | PRN

## 2019-12-06 NOTE — Progress Notes (Signed)
PROVIDER NOTE: Information contained herein reflects review and annotations entered in association with encounter. Interpretation of such information and data should be left to medically-trained personnel. Information provided to patient can be located elsewhere in the medical record under "Patient Instructions". Document created using STT-dictation technology, any transcriptional errors that may result from process are unintentional.    Patient: Jocelyn Wells  Service Category: E/M  Provider: Gillis Santa, MD  DOB: 20-Nov-1955  DOS: 12/06/2019  Specialty: Interventional Pain Management  MRN: [id number]  Setting: Ambulatory outpatient  PCP: Margo Common, PA  Type: Established Patient    Referring Provider: Margo Common, PA  Location: Office  Delivery: Face-to-face     HPI  [name], a 64 y.o. year old female, is here today because of her Lumbar foraminal stenosis [[name]]. Jocelyn Wells primary complain today is Back Pain (lower) Last encounter: My last encounter with her was on 08/21/2019. Pertinent problems: Jocelyn Wells has H/O: osteoarthritis; Anxiety, generalized; Depression, major, recurrent, moderate ([place]); Diabetes mellitus type 2, uncontrolled ([place]); Arthritis, degenerative; Morbid obesity due to excess calories ([place]); Cerebrovascular accident (CVA) due to thrombosis of left middle cerebral artery ([place]); Cerebrovascular accident (CVA) due to bilateral thrombosis of carotid arteries ([place]); Type II diabetes mellitus with renal manifestations, uncontrolled ([place]); Compression fracture of L1 lumbar vertebra ([place]); Compression fracture of body of thoracic vertebra ([place]); Lumbar foraminal stenosis (RIGHT L1); Spinal stenosis of lumbar region with neurogenic claudication (L4-L5); and MDD (major depressive disorder), recurrent, in full remission ([place]) on their pertinent problem list. Pain Assessment: Severity of Chronic pain is reported as a 5 /10. Location: Back Lower/both legs to the  feet. Onset: More than a month ago. Quality: Dull, Aching. Timing: Intermittent. Modifying factor(s): sitting, Tramadol. Vitals:  height is 5' 6"  (1.676 m) and weight is 216 lb (98 kg). Her temporal temperature is 97.2 F (36.2 C) (abnormal). Her blood pressure is 130/76 and her pulse is 77. Her respiration is 16 and oxygen saturation is 96%.   Reason for encounter: medication management.   No change in medical history since last visit.  Patient's pain is at baseline.  Patient continues multimodal pain regimen as prescribed.  States that it provides pain relief and improvement in functional status. States that she did have 1 fall in the interim where she tripped.  No other significant changes in her medical history.  Pharmacotherapy Assessment   Analgesic: Tramadol 50 mg twice daily as needed, quantity 60/month    Monitoring: [place] PMP: PDMP reviewed during this encounter.       Pharmacotherapy: No side-effects or adverse reactions reported. Compliance: No problems identified. Effectiveness: Clinically acceptable.  Landis Martins, RN  12/06/2019  2:44 PM  Sign when Signing Visit Nursing Pain Medication Assessment:  Safety precautions to be maintained throughout the outpatient stay will include: orient to surroundings, keep bed in low position, maintain call bell within reach at all times, provide assistance with transfer out of bed and ambulation.  Medication Inspection Compliance: Jocelyn Wells did not comply with our request to bring her pills to be counted. She was reminded that bringing the medication bottles, even when empty, is a requirement.  Medication: None brought in. Pill/Patch Count: None available to be counted. Bottle Appearance: No container available. Did not bring bottle(s) to appointment. Filled Date: N/A Last Medication intake:  Today    UDS:  Summary  Date Value Ref Range Status  04/17/2019 Note  Final    Comment:     ====================================================================  Compliance Drug Analysis, Ur ==================================================================== Test                             Result       Flag       Units Drug Present   Tramadol                       >6410                   ng/mg creat   O-Desmethyltramadol            3904                    ng/mg creat   N-Desmethyltramadol            3140                    ng/mg creat    Source of tramadol is a prescription medication. O-desmethyltramadol    and N-desmethyltramadol are expected metabolites of tramadol.   Gabapentin                     PRESENT   Paroxetine                     PRESENT ==================================================================== Test                      Result    Flag   Units      Ref Range   Creatinine              78               mg/dL      >=20 ==================================================================== Declared Medications:  Medication list was not provided. ==================================================================== For clinical consultation, please call [phone]. ====================================================================      ROS  Constitutional: Denies any fever or chills Gastrointestinal: No reported hemesis, hematochezia, vomiting, or acute GI distress Musculoskeletal: Low back pain Neurological: No reported episodes of acute onset apraxia, aphasia, dysarthria, agnosia, amnesia, paralysis, loss of coordination, or loss of consciousness  Medication Review  Insulin Syringe-Needle U-100, PARoxetine, Semaglutide (1 MG/DOSE), Vitamin D (Ergocalciferol), apixaban, atorvastatin, calcipotriene, denosumab, gabapentin, glucose blood, glyBURIDE-metformin, hydrochlorothiazide, insulin NPH-regular Human, insulin glargine, loratadine, losartan, mupirocin ointment, traMADol, and triamcinolone cream  History Review  Allergy: Jocelyn Wells is allergic to  codeine. Drug: Jocelyn Wells  reports no history of drug use. Alcohol:  reports no history of alcohol use. Tobacco:  reports that she has been smoking cigarettes. She started smoking about 45 years ago. She has a 36.00 pack-year smoking history. She has never used smokeless tobacco. Social: Ms. Troupe  reports that she has been smoking cigarettes. She started smoking about 45 years ago. She has a 36.00 pack-year smoking history. She has never used smokeless tobacco. She reports that she does not drink alcohol and does not use drugs. Medical:  has a past medical history of Anxiety, Cardiomyopathy ([place]), COPD (chronic obstructive pulmonary disease) ([place]), Depression, Diabetes mellitus, type II ([place]), HTN (hypertension), Obesity, Osteoporosis, PONV (postoperative nausea and vomiting), and Stroke ([place]). Surgical: Ms. Standiford  has a past surgical history that includes Abdominal hysterectomy; Tubal ligation (1978); Cesarean section (1978); Knee arthroscopy (Left, 2005); Ulnar nerve transposition (2008); Bilateral salpingoophorectomy (2000); Ankle fracture surgery (Left, 2002); TEE without cardioversion (N/A, 01/31/2015); Cardiac catheterization (N/A, 03/25/2015); Cardiac catheterization (N/A,  08/05/2015); Esophagogastroduodenoscopy (egd) with propofol (N/A, 09/22/2016); Colonoscopy with propofol (N/A, 09/22/2016); and Esophagogastroduodenoscopy (egd) with propofol (N/A, 12/08/2016). Family: family history includes Alcohol abuse in her paternal aunt; Aneurysm in her father; Asthma in her mother; COPD in her brother; Diabetes in her brother and mother; Heart disease in her father and mother.  Laboratory Chemistry Profile   Renal Lab Results  Component Value Date   BUN 32 (H) 04/23/2019   CREATININE 1.82 (H) 04/23/2019   BCR 18 04/23/2019   GFRAA 33 (L) 04/23/2019   GFRNONAA 29 (L) 04/23/2019     Hepatic Lab Results  Component Value Date   AST 11 04/23/2019   ALT 13 04/23/2019   ALBUMIN 3.8 04/23/2019    ALKPHOS 89 04/23/2019     Electrolytes Lab Results  Component Value Date   NA 143 04/23/2019   K 5.2 04/23/2019   CL 107 (H) 04/23/2019   CALCIUM 8.7 04/23/2019     Bone No results found for: VD25OH, VD125OH2TOT, XI3382NK5, LZ7673AL9, 25OHVITD1, 25OHVITD2, 25OHVITD3, TESTOFREE, TESTOSTERONE   Inflammation (CRP: Acute Phase) (ESR: Chronic Phase) No results found for: CRP, ESRSEDRATE, LATICACIDVEN     Note: Above Lab results reviewed.  Recent Imaging Review  CUP PACEART INCLINIC DEVICE CHECK Loop check in clinic. Battery status: EOS as of 03/14/19. No symptom, tachy, or brady episodes. 4 pause episodes--false, undersensing. 1 AF episode--false, intermittent undersensing of SR. Patient declines ILR explant. ROV with SK PRN.Levander Campion BSN,  RN, CCDS Note: Reviewed        Physical Exam  General appearance: Well nourished, well developed, and well hydrated. In no apparent acute distress Mental status: Alert, oriented x 3 (person, place, & time)       Respiratory: No evidence of acute respiratory distress Eyes: PERLA Vitals: BP 130/76   Pulse 77   Temp (!) 97.2 F (36.2 C) (Temporal)   Resp 16   Ht 5' 6"  (1.676 m)   Wt 216 lb (98 kg)   SpO2 96%   BMI 34.86 kg/m  BMI: Estimated body mass index is 34.86 kg/m as calculated from the following:   Height as of this encounter: 5' 6"  (1.676 m).   Weight as of this encounter: 216 lb (98 kg). Ideal: Ideal body weight: 59.3 kg (130 lb 11.7 oz) Adjusted ideal body weight: 74.8 kg (164 lb 13.4 oz)  Lumbar Spine Area Exam  Skin & Axial Inspection: No masses, redness, or swelling Alignment: Symmetrical Functional ROM: Pain restricted ROM affecting both sides Stability: No instability detected Muscle Tone/Strength: Functionally intact. No obvious neuro-muscular anomalies detected. Sensory (Neurological): Dermatomal pain pattern and neurogenic Palpation: No palpable anomalies       Provocative Tests: Hyperextension/rotation test: (+)  bilaterally for facet joint pain. Lumbar quadrant test (Kemp's test): (+) bilateral for foraminal stenosis  Gait & Posture Assessment  Ambulation: Patient ambulates using a cane Gait: Significantly limited. Dependent on assistive device to ambulate Posture: Difficulty standing up straight, due to pain  Lower Extremity Exam    Side: Right lower extremity  Side: Left lower extremity  Stability: No instability observed          Stability: No instability observed          Skin & Extremity Inspection: Skin color, temperature, and hair growth are WNL. No peripheral edema or cyanosis. No masses, redness, swelling, asymmetry, or associated skin lesions. No contractures.  Skin & Extremity Inspection: Skin color, temperature, and hair growth are WNL. No peripheral edema or cyanosis. No masses, redness,  swelling, asymmetry, or associated skin lesions. No contractures.  Functional ROM: Pain restricted ROM for hip and knee joints          Functional ROM: Pain restricted ROM for hip and knee joints          Muscle Tone/Strength: Functionally intact. No obvious neuro-muscular anomalies detected.  Muscle Tone/Strength: Functionally intact. No obvious neuro-muscular anomalies detected.  Sensory (Neurological): Musculoskeletal pain pattern        Sensory (Neurological): Musculoskeletal pain pattern        DTR: Patellar: deferred today Achilles: deferred today Plantar: deferred today  DTR: Patellar: deferred today Achilles: deferred today Plantar: deferred today  Palpation: No palpable anomalies  Palpation: No palpable anomalies     Assessment   Status Diagnosis  Controlled Controlled Controlled 1. Lumbar foraminal stenosis (RIGHT L1)   2. Spinal stenosis of lumbar region with neurogenic claudication (L4-L5)   3. Chronic pain syndrome   4. Compression fracture of body of thoracic vertebra (HCC)   5. Compression fracture of L1 vertebra, sequela   6. Type II diabetes mellitus with renal  manifestations, uncontrolled ([place])   7. Cerebrovascular accident (CVA) due to bilateral thrombosis of carotid arteries ([place])   8. Cerebrovascular accident (CVA) due to thrombosis of left middle cerebral artery ([place])        Plan of Care  [name] has a current medication list which includes the following long-term medication(s): apixaban, atorvastatin, gabapentin, glyburide-metformin, hydrochlorothiazide, loratadine, losartan, novolin 70/30 relion, and paroxetine.  Pharmacotherapy (Medications Ordered): Meds ordered this encounter  Medications  . traMADol (ULTRAM) 50 MG tablet    Sig: Take 1 tablet (50 mg total) by mouth 2 (two) times daily as needed for severe pain. Month last 30 days.    Dispense:  60 tablet    Refill:  2    [place] STOP ACT - Not applicable. Fill one day early if pharmacy is closed on scheduled refill date.   Follow-up plan:   Return in about 4 months (around 04/07/2020) for Medication Management, in person.   Recent Visits No visits were found meeting these conditions. Showing recent visits within past 90 days and meeting all other requirements Today's Visits Date Type Provider Dept  12/06/19 Office Visit Gillis Santa, MD [facility]  Showing today's visits and meeting all other requirements Future Appointments No visits were found meeting these conditions. Showing future appointments within next 90 days and meeting all other requirements  I discussed the assessment and treatment plan with the patient. The patient was provided an opportunity to ask questions and all were answered. The patient agreed with the plan and demonstrated an understanding of the instructions.  Patient advised to call back or seek an in-person evaluation if the symptoms or condition worsens.  Duration of encounter: 30 minutes.  Note by: Gillis Santa, MD Date: 12/06/2019; Time: 3:20 PM

## 2019-12-06 NOTE — Progress Notes (Signed)
Nursing Pain Medication Assessment:  Safety precautions to be maintained throughout the outpatient stay will include: orient to surroundings, keep bed in low position, maintain call bell within reach at all times, provide assistance with transfer out of bed and ambulation.  Medication Inspection Compliance: Jocelyn Wells did not comply with our request to bring her pills to be counted. She was reminded that bringing the medication bottles, even when empty, is a requirement.  Medication: None brought in. Pill/Patch Count: None available to be counted. Bottle Appearance: No container available. Did not bring bottle(s) to appointment. Filled Date: N/A Last Medication intake:  Today

## 2019-12-18 DIAGNOSIS — I10 Essential (primary) hypertension: Secondary | ICD-10-CM | POA: Diagnosis not present

## 2019-12-18 DIAGNOSIS — Z9119 Patient's noncompliance with other medical treatment and regimen: Secondary | ICD-10-CM | POA: Diagnosis not present

## 2019-12-18 DIAGNOSIS — E113393 Type 2 diabetes mellitus with moderate nonproliferative diabetic retinopathy without macular edema, bilateral: Secondary | ICD-10-CM | POA: Diagnosis not present

## 2019-12-18 DIAGNOSIS — M8588 Other specified disorders of bone density and structure, other site: Secondary | ICD-10-CM | POA: Diagnosis not present

## 2019-12-18 DIAGNOSIS — E1165 Type 2 diabetes mellitus with hyperglycemia: Secondary | ICD-10-CM | POA: Diagnosis not present

## 2019-12-18 DIAGNOSIS — E1159 Type 2 diabetes mellitus with other circulatory complications: Secondary | ICD-10-CM | POA: Diagnosis not present

## 2019-12-18 DIAGNOSIS — E1122 Type 2 diabetes mellitus with diabetic chronic kidney disease: Secondary | ICD-10-CM | POA: Diagnosis not present

## 2019-12-18 DIAGNOSIS — M810 Age-related osteoporosis without current pathological fracture: Secondary | ICD-10-CM | POA: Diagnosis not present

## 2019-12-18 DIAGNOSIS — E1129 Type 2 diabetes mellitus with other diabetic kidney complication: Secondary | ICD-10-CM | POA: Diagnosis not present

## 2019-12-18 DIAGNOSIS — E1121 Type 2 diabetes mellitus with diabetic nephropathy: Secondary | ICD-10-CM | POA: Diagnosis not present

## 2019-12-18 DIAGNOSIS — F17200 Nicotine dependence, unspecified, uncomplicated: Secondary | ICD-10-CM | POA: Diagnosis not present

## 2019-12-18 DIAGNOSIS — E1142 Type 2 diabetes mellitus with diabetic polyneuropathy: Secondary | ICD-10-CM | POA: Diagnosis not present

## 2019-12-19 ENCOUNTER — Telehealth: Payer: Self-pay | Admitting: *Deleted

## 2019-12-19 NOTE — Chronic Care Management (AMB) (Signed)
  Chronic Care Management   Outreach Note  12/19/2019 Name: [name] MRN: [id number] DOB: [birth year]  Jocelyn Wells is a 64 y.o. year old female who is a primary care patient of [place]ism[place], Vickki Muff, [place]. I reached out to Jocelyn Wells by phone today in response to a referral sent by Ms. [employer] health plan.     An unsuccessful telephone outreach was attempted today. The patient was referred to the case management team for assistance with care management and care coordination.   Follow Up Plan: A HIPAA compliant phone message was left for the patient providing contact information and requesting a return call. The care management team will reach out to the patient again over the next 7 days. If patient returns call to provider office, please advise to call [place] at [phone].  [place] [facility]  Direct Dial: [phone]

## 2019-12-26 ENCOUNTER — Other Ambulatory Visit: Payer: Self-pay | Admitting: Neurosurgery

## 2019-12-26 DIAGNOSIS — M5124 Other intervertebral disc displacement, thoracic region: Secondary | ICD-10-CM

## 2020-01-02 NOTE — Chronic Care Management (AMB) (Signed)
  Chronic Care Management   Note  01/02/2020 Name: [name] MRN: [id number] DOB: May 10, 1955  Jocelyn Wells is a 64 y.o. year old female who is a primary care patient of Chrismon, Vickki Muff, [place]. I reached out to Jocelyn Wells by phone today in response to a referral sent by Ms. [employer] health plan.     Ms. Poster was given information about Chronic Care Management services today including:  1. CCM service includes personalized support from designated clinical staff supervised by her physician, including individualized plan of care and coordination with other care providers 2. 24/7 contact phone numbers for assistance for urgent and routine care needs. 3. Service will only be billed when office clinical staff spend 20 minutes or more in a month to coordinate care. 4. Only one practitioner may furnish and bill the service in a calendar month. 5. The patient may stop CCM services at any time (effective at the end of the month) by phone call to the office staff. 6. The patient will be responsible for cost sharing (co-pay) of up to 20% of the service fee (after annual deductible is met).  Patient agreed to services and verbal consent obtained.   Follow up plan: Telephone appointment with care management team member scheduled for:01/31/2020  [place] [facility]  Direct Dial: [phone]

## 2020-01-08 NOTE — Telephone Encounter (Signed)
Done

## 2020-01-10 ENCOUNTER — Ambulatory Visit
Admission: RE | Admit: 2020-01-10 | Discharge: 2020-01-10 | Disposition: A | Discharge status: Home / Self Care | Payer: Medicare Other | Source: Ambulatory Visit | Attending: Neurosurgery | Admitting: Neurosurgery

## 2020-01-10 ENCOUNTER — Other Ambulatory Visit: Payer: Self-pay

## 2020-01-10 DIAGNOSIS — M5124 Other intervertebral disc displacement, thoracic region: Secondary | ICD-10-CM

## 2020-01-10 DIAGNOSIS — M48061 Spinal stenosis, lumbar region without neurogenic claudication: Secondary | ICD-10-CM | POA: Diagnosis not present

## 2020-01-10 DIAGNOSIS — M4804 Spinal stenosis, thoracic region: Secondary | ICD-10-CM | POA: Diagnosis not present

## 2020-01-10 DIAGNOSIS — S22080A Wedge compression fracture of T11-T12 vertebra, initial encounter for closed fracture: Secondary | ICD-10-CM | POA: Diagnosis not present

## 2020-01-17 DIAGNOSIS — M5124 Other intervertebral disc displacement, thoracic region: Secondary | ICD-10-CM | POA: Diagnosis not present

## 2020-01-31 ENCOUNTER — Telehealth: Payer: Medicare Other

## 2020-02-13 ENCOUNTER — Telehealth (INDEPENDENT_AMBULATORY_CARE_PROVIDER_SITE_OTHER): Payer: Medicare Other | Admitting: Psychiatry

## 2020-02-13 ENCOUNTER — Encounter (HOSPITAL_COMMUNITY): Payer: Self-pay | Admitting: Psychiatry

## 2020-02-13 ENCOUNTER — Other Ambulatory Visit: Payer: Self-pay

## 2020-02-13 DIAGNOSIS — F3342 Major depressive disorder, recurrent, in full remission: Secondary | ICD-10-CM

## 2020-02-13 MED ORDER — PAROXETINE HCL 40 MG PO TABS: 40.0000 mg | ORAL_TABLET | Freq: Every day | ORAL | 1 refills | Status: DC

## 2020-02-13 NOTE — Progress Notes (Signed)
[place] MD OP Progress Note  Virtual Visit via Telephone Note  I connected with Jocelyn Wells on 02/13/20 at 11:00 AM EST by telephone and verified that I am speaking with the correct person using two identifiers.  Location: Patient: home Provider: Clinic   I discussed the limitations, risks, security and privacy concerns of performing an evaluation and management service by telephone and the availability of in person appointments. I also discussed with the patient that there may be a patient responsible charge related to this service. The patient expressed understanding and agreed to proceed.   I provided 12 minutes of non-face-to-face time during this encounter.   02/13/2020 10:50 AM Jocelyn Wells  MRN:  [id number]  Chief Complaint: "Everything is the same old same old"  HPI: Patient informed that everything is going well.  She denied any significant issues or concerns in the recent past few months.  She stated that she takes her Paxil every day as usual and denied any issues pertaining to her mood.  She informed that she sleeps well.   She stated there is nothing exciting that she has to report.  Visit Diagnosis:    ICD-10-CM   1. MDD (major depressive disorder), recurrent, in full remission ([place])  F33.42 PARoxetine (PAXIL) 40 MG tablet    Past Psychiatric History: MDD  Past Medical History:  Past Medical History:  Diagnosis Date  . Anxiety   . Cardiomyopathy [facility])    new to her Jan 2017  . COPD (chronic obstructive pulmonary disease) ([place])   . Depression   . Diabetes mellitus, type II ([place])   . HTN (hypertension)   . Obesity   . Osteoporosis   . PONV (postoperative nausea and vomiting)   . Stroke [facility])    Jan 2017    Past Surgical History:  Procedure Laterality Date  . ABDOMINAL HYSTERECTOMY    . ANKLE FRACTURE SURGERY Left 2002  . BILATERAL SALPINGOOPHORECTOMY  2000  . CARDIAC CATHETERIZATION N/A 03/25/2015   Procedure: Right/Left Heart Cath and Coronary  Angiography;  Surgeon: Belva Crome, MD;  Location: [place] CV LAB;  Service: Cardiovascular;  Laterality: N/A;  . [place]  . COLONOSCOPY WITH PROPOFOL N/A 09/22/2016   Procedure: COLONOSCOPY WITH PROPOFOL;  Surgeon: Manya Silvas, MD;  Location: [facility] ENDOSCOPY;  Service: Endoscopy;  Laterality: N/A;  . EP IMPLANTABLE DEVICE N/A 08/05/2015   Procedure: Loop Recorder Insertion;  Surgeon: Deboraha Sprang, MD;  Location: [place] CV LAB;  Service: Cardiovascular;  Laterality: N/A;  . ESOPHAGOGASTRODUODENOSCOPY (EGD) WITH PROPOFOL N/A 09/22/2016   Procedure: ESOPHAGOGASTRODUODENOSCOPY (EGD) WITH PROPOFOL;  Surgeon: Manya Silvas, MD;  Location: [facility] ENDOSCOPY;  Service: Endoscopy;  Laterality: N/A;  . ESOPHAGOGASTRODUODENOSCOPY (EGD) WITH PROPOFOL N/A 12/08/2016   Procedure: ESOPHAGOGASTRODUODENOSCOPY (EGD) WITH PROPOFOL;  Surgeon: Manya Silvas, MD;  Location: [facility] ENDOSCOPY;  Service: Endoscopy;  Laterality: N/A;  . KNEE ARTHROSCOPY Left 2005  . TEE WITHOUT CARDIOVERSION N/A 01/31/2015   Procedure: TRANSESOPHAGEAL ECHOCARDIOGRAM (TEE);  Surgeon: Lelon Perla, MD;  Location: [place];  Service: Cardiovascular;  Laterality: N/A;  . [place]  . ULNAR NERVE TRANSPOSITION  2008    Family Psychiatric History: see below  Family History:  Family History  Problem Relation Age of Onset  . Heart disease Mother        died from CHF  . Asthma Mother   . Diabetes Mother   . Heart disease Father   . Aneurysm Father   .  COPD Brother   . Diabetes Brother   . Alcohol abuse Paternal Aunt   . Anemia Neg Hx   . Arrhythmia Neg Hx   . Clotting disorder Neg Hx   . Fainting Neg Hx   . Heart attack Neg Hx   . Heart failure Neg Hx   . Hyperlipidemia Neg Hx   . Hypertension Neg Hx     Social History:  Social History   Socioeconomic History  . Marital status: Married    Spouse name: Not on file  . Number of children: 2  . Years of education: Not  on file  . Highest education level: Bachelor's degree (e.g., BA, AB, BS)  Occupational History  . Occupation: disabled    Comment: retired  Tobacco Use  . Smoking status: Current Every Day Smoker    Packs/day: 1.00    Years: 36.00    Pack years: 36.00    Types: Cigarettes    Start date: 02/08/1974  . Smokeless tobacco: Never Used  . Tobacco comment: "I can not quit smoking, I have tried". 1 PPD  Vaping Use  . Vaping Use: Never used  Substance and Sexual Activity  . Alcohol use: No    Alcohol/week: 0.0 standard drinks  . Drug use: No  . Sexual activity: Yes    Birth control/protection: None  Other Topics Concern  . Not on file  Social History Narrative   Lives at home with stepson and husband, dogs and cats   Caffeine  Drinks sweet tea.   Right handed.    Social Determinants of Health   Financial Resource Strain: Not on file  Food Insecurity: Not on file  Transportation Needs: Not on file  Physical Activity: Not on file  Stress: Not on file  Social Connections: Not on file    Allergies:  Allergies  Allergen Reactions  . Codeine Nausea And Vomiting    Metabolic Disorder Labs: Lab Results  Component Value Date   HGBA1C 11.4 03/28/2019   MPG 303.44 10/06/2017   MPG 194 01/30/2015   No results found for: PROLACTIN Lab Results  Component Value Date   CHOL 132 04/23/2019   TRIG 175 (H) 04/23/2019   HDL 43 04/23/2019   CHOLHDL 3.1 04/23/2019   VLDL 64 (H) 10/06/2017   LDLCALC 60 04/23/2019   LDLCALC 88 10/06/2017   No results found for: TSH  Therapeutic Level Labs: No results found for: LITHIUM No results found for: VALPROATE No components found for:  CBMZ  Current Medications: Current Outpatient Medications  Medication Sig Dispense Refill  . apixaban (ELIQUIS) 2.5 MG TABS tablet Take 1 tablet (2.5 mg total) by mouth 2 (two) times daily. 60 tablet 11  . atorvastatin (LIPITOR) 40 MG tablet Take 1 tablet (40 mg total) by mouth daily. 90 tablet 3  . B-D  INS SYRINGE 0.5CC/31GX5/16 31G X 5/16" 0.5 ML MISC     . calcipotriene (DOVONOX) 0.005 % cream Apply topically as needed. For psoriasis flares    . denosumab (PROLIA) 60 MG/ML SOSY injection Inject 60 mg into the skin every 6 (six) months.    . gabapentin (NEURONTIN) 300 MG capsule Take 1 capsule (300 mg total) by mouth daily. (Patient taking differently: Take 300 mg by mouth 2 (two) times daily. ) 90 capsule 3  . glucose blood test strip OneTouch Verio strips    . glyBURIDE-metformin (GLUCOVANCE) 5-500 MG per tablet Take 2 tablets by mouth 2 (two) times daily.     . hydrochlorothiazide (HYDRODIURIL)  25 MG tablet Take 25 mg by mouth daily.    . insulin glargine (LANTUS) 100 UNIT/ML injection Inject 40 Units into the skin daily.    Marland Kitchen loratadine (CLARITIN) 10 MG tablet Take 1 tablet by mouth daily.    Marland Kitchen losartan (COZAAR) 25 MG tablet Take 2 tablets (50 mg total) by mouth daily. 180 tablet 3  . mupirocin ointment (BACTROBAN) 2 % mupirocin 2 % topical ointment    . NOVOLIN 70/30 RELION (70-30) 100 UNIT/ML injection Inject 10-50 Units into the skin 2 (two) times daily with a meal. 10units-AM,  40-50units-PM    . OZEMPIC, 1 MG/DOSE, 2 MG/1.5ML SOPN     . PARoxetine (PAXIL) 40 MG tablet Take 1 tablet (40 mg total) by mouth daily. 90 tablet 1  . Semaglutide, 1 MG/DOSE, 2 MG/1.5ML SOPN Inject into the skin. (Patient not taking: Reported on 12/06/2019)    . traMADol (ULTRAM) 50 MG tablet Take 1 tablet (50 mg total) by mouth 2 (two) times daily as needed for severe pain. Month last 30 days. 60 tablet 2  . triamcinolone cream (KENALOG) 0.1 % Apply 1 application topically as needed (for psoriasis flares).  (Patient not taking: Reported on 12/06/2019)    . Vitamin D, Ergocalciferol, (DRISDOL) 1.25 MG (50000 UT) CAPS capsule Take 50,000 Units by mouth once a week.     No current facility-administered medications for this visit.    Musculoskeletal: Strength & Muscle Tone: unable to assess due to telemed  visit Gait & Station: unable to assess due to telemed visit Patient leans: unable to assess due to telemed visit   Psychiatric Specialty Exam: Review of Systems  There were no vitals taken for this visit.There is no height or weight on file to calculate BMI.  General Appearance: unable to assess due to phone visit  Eye Contact:  unable to assess due to phone visit  Speech:  Clear and Coherent and Normal Rate  Volume:  Normal  Mood:  Euthymic  Affect:  Congruent  Thought Process:  Goal Directed, Linear and Descriptions of Associations: Intact  Orientation:  Full (Time, Place, and Person)  Thought Content: Logical   Suicidal Thoughts:  No  Homicidal Thoughts:  No  Memory:  Recent;   Good Remote;   Good  Judgement:  Fair  Insight:  Fair  Psychomotor Activity:  Normal  Concentration:  Concentration: Good and Attention Span: Good  Recall:  Good  Fund of Knowledge: Good  Language: Good  Akathisia:  Negative  Handed:  Right  AIMS (if indicated): not done  Assets:  Communication Skills Desire for Improvement Financial Resources/Insurance Housing  ADL's:  Intact  Cognition: WNL  Sleep:  Fair     Screenings: PHQ2-9   [place] Office Visit from 12/06/2019 in [place] Office Visit from 08/21/2019 in [place] from 04/10/2018 in [place] from 12/26/2017 in [place] Office Visit from 11/15/2017 in [place]  PHQ-2 Total Score 0 0 2 0 0  PHQ-9 Total Score -- -- 6 -- --       Assessment and Plan: Patient appears to be doing well on her current regimen.  1. MDD (major depressive disorder), recurrent, in full remission ([place]) - Continue Paxil 40 mg daily.  Continue same medication regimen. Follow up in 3 months.    [place] Crane, MD 02/13/2020, 10:50 AM

## 2020-02-14 ENCOUNTER — Ambulatory Visit (INDEPENDENT_AMBULATORY_CARE_PROVIDER_SITE_OTHER): Payer: Medicare Other

## 2020-02-14 DIAGNOSIS — I5022 Chronic systolic (congestive) heart failure: Secondary | ICD-10-CM

## 2020-02-14 DIAGNOSIS — J449 Chronic obstructive pulmonary disease, unspecified: Secondary | ICD-10-CM

## 2020-02-14 DIAGNOSIS — Z794 Long term (current) use of insulin: Secondary | ICD-10-CM

## 2020-02-14 DIAGNOSIS — E1143 Type 2 diabetes mellitus with diabetic autonomic (poly)neuropathy: Secondary | ICD-10-CM | POA: Diagnosis not present

## 2020-02-18 DIAGNOSIS — E1129 Type 2 diabetes mellitus with other diabetic kidney complication: Secondary | ICD-10-CM | POA: Diagnosis not present

## 2020-02-18 DIAGNOSIS — M810 Age-related osteoporosis without current pathological fracture: Secondary | ICD-10-CM | POA: Diagnosis not present

## 2020-02-18 DIAGNOSIS — Z794 Long term (current) use of insulin: Secondary | ICD-10-CM | POA: Diagnosis not present

## 2020-02-18 DIAGNOSIS — I10 Essential (primary) hypertension: Secondary | ICD-10-CM | POA: Diagnosis not present

## 2020-02-18 DIAGNOSIS — E1142 Type 2 diabetes mellitus with diabetic polyneuropathy: Secondary | ICD-10-CM | POA: Diagnosis not present

## 2020-02-18 DIAGNOSIS — N183 Chronic kidney disease, stage 3 unspecified: Secondary | ICD-10-CM | POA: Diagnosis not present

## 2020-02-18 DIAGNOSIS — E113393 Type 2 diabetes mellitus with moderate nonproliferative diabetic retinopathy without macular edema, bilateral: Secondary | ICD-10-CM | POA: Diagnosis not present

## 2020-02-18 DIAGNOSIS — E1165 Type 2 diabetes mellitus with hyperglycemia: Secondary | ICD-10-CM | POA: Diagnosis not present

## 2020-02-18 DIAGNOSIS — E1159 Type 2 diabetes mellitus with other circulatory complications: Secondary | ICD-10-CM | POA: Diagnosis not present

## 2020-02-18 DIAGNOSIS — E1121 Type 2 diabetes mellitus with diabetic nephropathy: Secondary | ICD-10-CM | POA: Diagnosis not present

## 2020-02-18 DIAGNOSIS — F17200 Nicotine dependence, unspecified, uncomplicated: Secondary | ICD-10-CM | POA: Diagnosis not present

## 2020-02-18 DIAGNOSIS — R809 Proteinuria, unspecified: Secondary | ICD-10-CM | POA: Diagnosis not present

## 2020-02-18 DIAGNOSIS — E1122 Type 2 diabetes mellitus with diabetic chronic kidney disease: Secondary | ICD-10-CM | POA: Diagnosis not present

## 2020-03-06 ENCOUNTER — Telehealth: Payer: Self-pay | Admitting: Family Medicine

## 2020-03-06 NOTE — Telephone Encounter (Signed)
Copied from [place] [id number]. Topic: Medicare AWV >> Mar 06, 2020  4:17 PM Cher Nakai R wrote: Reason for CRM:   Left message for patient to call back and schedule Medicare Annual Wellness Visit (AWV) in office.   If not able to come in office, please offer to do virtually or by telephone.   Last AWV 04/10/2018  Please schedule at anytime with [facility] Health Advisor.  If any questions, please contact me at [phone]

## 2020-03-14 DIAGNOSIS — E1121 Type 2 diabetes mellitus with diabetic nephropathy: Secondary | ICD-10-CM | POA: Diagnosis not present

## 2020-03-14 DIAGNOSIS — E1142 Type 2 diabetes mellitus with diabetic polyneuropathy: Secondary | ICD-10-CM | POA: Diagnosis not present

## 2020-03-14 DIAGNOSIS — I10 Essential (primary) hypertension: Secondary | ICD-10-CM | POA: Diagnosis not present

## 2020-03-14 DIAGNOSIS — E1159 Type 2 diabetes mellitus with other circulatory complications: Secondary | ICD-10-CM | POA: Diagnosis not present

## 2020-03-14 DIAGNOSIS — F17200 Nicotine dependence, unspecified, uncomplicated: Secondary | ICD-10-CM | POA: Diagnosis not present

## 2020-03-14 DIAGNOSIS — E113393 Type 2 diabetes mellitus with moderate nonproliferative diabetic retinopathy without macular edema, bilateral: Secondary | ICD-10-CM | POA: Diagnosis not present

## 2020-03-14 DIAGNOSIS — E1129 Type 2 diabetes mellitus with other diabetic kidney complication: Secondary | ICD-10-CM | POA: Diagnosis not present

## 2020-03-14 DIAGNOSIS — E1122 Type 2 diabetes mellitus with diabetic chronic kidney disease: Secondary | ICD-10-CM | POA: Diagnosis not present

## 2020-03-14 DIAGNOSIS — Z794 Long term (current) use of insulin: Secondary | ICD-10-CM | POA: Diagnosis not present

## 2020-03-14 DIAGNOSIS — M810 Age-related osteoporosis without current pathological fracture: Secondary | ICD-10-CM | POA: Diagnosis not present

## 2020-03-14 DIAGNOSIS — E1165 Type 2 diabetes mellitus with hyperglycemia: Secondary | ICD-10-CM | POA: Diagnosis not present

## 2020-04-01 ENCOUNTER — Encounter: Payer: Self-pay | Admitting: Student in an Organized Health Care Education/Training Program

## 2020-04-01 ENCOUNTER — Ambulatory Visit
Payer: Medicare Other | Attending: Student in an Organized Health Care Education/Training Program | Admitting: Student in an Organized Health Care Education/Training Program

## 2020-04-01 ENCOUNTER — Other Ambulatory Visit: Payer: Self-pay

## 2020-04-01 VITALS — BP 134/75 | HR 79 | Temp 97.1°F | Resp 16 | Ht 66.0 in | Wt 220.0 lb

## 2020-04-01 DIAGNOSIS — E1165 Type 2 diabetes mellitus with hyperglycemia: Secondary | ICD-10-CM | POA: Diagnosis not present

## 2020-04-01 DIAGNOSIS — M48062 Spinal stenosis, lumbar region with neurogenic claudication: Secondary | ICD-10-CM

## 2020-04-01 DIAGNOSIS — M48061 Spinal stenosis, lumbar region without neurogenic claudication: Secondary | ICD-10-CM | POA: Diagnosis not present

## 2020-04-01 DIAGNOSIS — E1129 Type 2 diabetes mellitus with other diabetic kidney complication: Secondary | ICD-10-CM

## 2020-04-01 DIAGNOSIS — I63033 Cerebral infarction due to thrombosis of bilateral carotid arteries: Secondary | ICD-10-CM | POA: Diagnosis not present

## 2020-04-01 DIAGNOSIS — G894 Chronic pain syndrome: Secondary | ICD-10-CM

## 2020-04-01 DIAGNOSIS — IMO0002 Reserved for concepts with insufficient information to code with codable children: Secondary | ICD-10-CM

## 2020-04-01 DIAGNOSIS — I63312 Cerebral infarction due to thrombosis of left middle cerebral artery: Secondary | ICD-10-CM

## 2020-04-01 DIAGNOSIS — S32010S Wedge compression fracture of first lumbar vertebra, sequela: Secondary | ICD-10-CM | POA: Diagnosis not present

## 2020-04-01 DIAGNOSIS — S22000A Wedge compression fracture of unspecified thoracic vertebra, initial encounter for closed fracture: Secondary | ICD-10-CM

## 2020-04-01 MED ORDER — TRAMADOL HCL 50 MG PO TABS: 50.0000 mg | ORAL_TABLET | Freq: Two times a day (BID) | ORAL | 2 refills | Status: AC | PRN

## 2020-04-01 NOTE — Progress Notes (Signed)
PROVIDER NOTE: Information contained herein reflects review and annotations entered in association with encounter. Interpretation of such information and data should be left to medically-trained personnel. Information provided to patient can be located elsewhere in the medical record under "Patient Instructions". Document created using STT-dictation technology, any transcriptional errors that may result from process are unintentional.    Patient: Jocelyn Wells  Service Category: E/M  Provider: Gillis Santa, MD  DOB: 29-Jul-1955  DOS: 04/01/2020  Specialty: Interventional Pain Management  MRN: [id number]  Setting: Ambulatory outpatient  PCP: Jocelyn Common, PA-C  Type: Established Patient    Referring Provider: Margo Common, PA-C  Location: Office  Delivery: Face-to-face     HPI  [name], a 65 y.o. year old female, is here today because of her Lumbar foraminal stenosis [[name]]. [name] primary complain today is Back Pain Last encounter: My last encounter with her was on 12/06/2019. Pertinent problems: [name] has H/O: osteoarthritis; Anxiety, generalized; Depression, major, recurrent, moderate ([place]); Diabetes mellitus type 2, uncontrolled ([place]); Arthritis, degenerative; Morbid obesity due to excess calories ([place]); Cerebrovascular accident (CVA) due to thrombosis of left middle cerebral artery ([place]); Cerebrovascular accident (CVA) due to bilateral thrombosis of carotid arteries ([place]); Type II diabetes mellitus with renal manifestations, uncontrolled ([place]); Compression fracture of L1 lumbar vertebra ([place]); Compression fracture of body of thoracic vertebra ([place]); Lumbar foraminal stenosis (RIGHT L1); Spinal stenosis of lumbar region with neurogenic claudication (L4-L5); and MDD (major depressive disorder), recurrent, in full remission ([place]) on their pertinent problem list. Pain Assessment: Severity of Chronic pain is reported as a 8 /10. Location: Back Lower/pain radiaties down  her legs at times. Onset: More than a month ago. Quality: Aching,Burning,Throbbing. Timing: Constant. Modifying factor(s): sit down, meds. Vitals:  height is 5' 6"  (1.676 m) and weight is 220 lb (99.8 kg). Her temporal temperature is 97.1 F (36.2 C) (abnormal). Her blood pressure is 134/75 and her pulse is 79. Her respiration is 16 and oxygen saturation is 96%.   Reason for encounter: medication management.    He presents today for medication management.  Since her last visit, she has transition to a new diabetic regimen which she states has helped with her blood glucose control.  She has also seen Dr. Cari Wells with neurosurgery.  He has recommended a T7-T8 decompression however not until her A1c is less than 7.5 and she is quit smoking.  She continues to smoke a pack per day.  We discussed how smoking can impact and amplify her chronic pain as well as decreased perfusion to various muscle beds that are important for movement.  Otherwise she continues her tramadol as prescribed, 50 mg twice daily.  She also has a history of CVA and a T12-L1 compression fracture with a T7-T8 disc herniation with some spinal cord compression but no clinical myelopathy.  Pharmacotherapy Assessment   Analgesic: Tramadol 50 mg twice daily as needed, quantity 60/month    Monitoring: [place] PMP: PDMP reviewed during this encounter.       Pharmacotherapy: No side-effects or adverse reactions reported. Compliance: No problems identified. Effectiveness: Clinically acceptable.  Jocelyn Fischer, RN  04/01/2020  1:58 PM  Sign when Signing Visit Nursing Pain Medication Assessment:  Safety precautions to be maintained throughout the outpatient stay will include: orient to surroundings, keep bed in low position, maintain call bell within reach at all times, provide assistance with transfer out of bed and ambulation.  Medication Inspection Compliance: Pill count conducted under aseptic conditions, in  front of the patient. Neither  the pills nor the bottle was removed from the patient's sight at any time. Once count was completed pills were immediately returned to the patient in their original bottle.  Medication: Tramadol (Ultram) Pill/Patch Count: 21 of 60 pills remain Pill/Patch Appearance: Markings consistent with prescribed medication Bottle Appearance: Standard pharmacy container. Clearly labeled. Filled Date: 1 / 10 / 22 Last Medication intake:  TodaySafety precautions to be maintained throughout the outpatient stay will include: orient to surroundings, keep bed in low position, maintain call bell within reach at all times, provide assistance with transfer out of bed and ambulation.     UDS:  Summary  Date Value Ref Range Status  04/17/2019 Note  Final    Comment:    ==================================================================== Compliance Drug Analysis, Ur ==================================================================== Test                             Result       Flag       Units Drug Present   Tramadol                       >6410                   ng/mg creat   O-Desmethyltramadol            3904                    ng/mg creat   N-Desmethyltramadol            3140                    ng/mg creat    Source of tramadol is a prescription medication. O-desmethyltramadol    and N-desmethyltramadol are expected metabolites of tramadol.   Gabapentin                     PRESENT   Paroxetine                     PRESENT ==================================================================== Test                      Result    Flag   Units      Ref Range   Creatinine              78               mg/dL      >=20 ==================================================================== Declared Medications:  Medication list was not provided. ==================================================================== For clinical consultation, please call [phone]. ====================================================================      ROS  Constitutional: Denies any fever or chills Gastrointestinal: No reported hemesis, hematochezia, vomiting, or acute GI distress Musculoskeletal: Mid back, low back pain Neurological: No reported episodes of acute onset apraxia, aphasia, dysarthria, agnosia, amnesia, paralysis, loss of coordination, or loss of consciousness  Medication Review  Dulaglutide, Insulin Syringe-Needle U-100, PARoxetine, Vitamin D (Ergocalciferol), apixaban, atorvastatin, denosumab, gabapentin, glucose blood, glyBURIDE-metformin, hydrochlorothiazide, insulin aspart, insulin glargine, loratadine, losartan, mupirocin ointment, and traMADol  History Review  Allergy: Ms. Flanigan is allergic to codeine. Drug: Ms. Molner  reports no history of drug use. Alcohol:  reports no history of alcohol use. Tobacco:  reports that she has been smoking cigarettes. She started smoking about 46 years ago. She has a 36.00 pack-year smoking history. She has never  used smokeless tobacco. Social: Ms. Sanagustin  reports that she has been smoking cigarettes. She started smoking about 46 years ago. She has a 36.00 pack-year smoking history. She has never used smokeless tobacco. She reports that she does not drink alcohol and does not use drugs. Medical:  has a past medical history of Anxiety, Cardiomyopathy ([place]), COPD (chronic obstructive pulmonary disease) ([place]), Depression, Diabetes mellitus, type II ([place]), HTN (hypertension), Obesity, Osteoporosis, PONV (postoperative nausea and vomiting), and Stroke ([place]). Surgical: Ms. Simmering  has a past surgical history that includes Abdominal hysterectomy; Tubal ligation (1978); Cesarean section (1978); Knee arthroscopy (Left, 2005); Ulnar nerve transposition (2008); Bilateral salpingoophorectomy (2000); Ankle fracture surgery (Left, 2002); TEE without cardioversion (N/A, 01/31/2015); Cardiac catheterization (N/A,  03/25/2015); Cardiac catheterization (N/A, 08/05/2015); Esophagogastroduodenoscopy (egd) with propofol (N/A, 09/22/2016); Colonoscopy with propofol (N/A, 09/22/2016); and Esophagogastroduodenoscopy (egd) with propofol (N/A, 12/08/2016). Family: family history includes Alcohol abuse in her paternal aunt; Aneurysm in her father; Asthma in her mother; COPD in her brother; Diabetes in her brother and mother; Heart disease in her father and mother.  Laboratory Chemistry Profile   Renal Lab Results  Component Value Date   BUN 32 (H) 04/23/2019   CREATININE 1.82 (H) 04/23/2019   BCR 18 04/23/2019   GFRAA 33 (L) 04/23/2019   GFRNONAA 29 (L) 04/23/2019     Hepatic Lab Results  Component Value Date   AST 11 04/23/2019   ALT 13 04/23/2019   ALBUMIN 3.8 04/23/2019   ALKPHOS 89 04/23/2019     Electrolytes Lab Results  Component Value Date   NA 143 04/23/2019   K 5.2 04/23/2019   CL 107 (H) 04/23/2019   CALCIUM 8.7 04/23/2019     Bone No results found for: VD25OH, VD125OH2TOT, KZ9935TS1, XB9390ZE0, 25OHVITD1, 25OHVITD2, 25OHVITD3, TESTOFREE, TESTOSTERONE   Inflammation (CRP: Acute Phase) (ESR: Chronic Phase) No results found for: CRP, ESRSEDRATE, LATICACIDVEN     Note: Above Lab results reviewed.  Recent Imaging Review  MR THORACIC SPINE WO CONTRAST CLINICAL DATA:  Herniated thoracic disc without myelopathy. Additional history provided by scanning technologist: Patient reports history of compression fracture, chronic lower and mid back pain since fall a few years ago. Pain radiates down legs with weakness in legs bilaterally.  EXAM: MRI THORACIC SPINE WITHOUT CONTRAST  TECHNIQUE: Multiplanar, multisequence MR imaging of the thoracic spine was performed. No intravenous contrast was administered.  COMPARISON:  Thoracic spine MRI 11/01/2018.  FINDINGS: Alignment: 3 mm T12-L1 grade 1 retrolisthesis, unchanged.  Vertebrae: Unchanged moderate chronic T12 compression  fracture predominantly involving the inferior endplate. Unchanged mild chronic L1 compression fracture predominantly involving the inferior endplate. Vertebral body height is otherwise maintained. New from the prior exam, there is a 3 mm T2/STIR hyperintense focus within the inferior T8 vertebral body, abutting the endplate, likely reflecting an acute/subacute Schmorl node (series 19, image 11).  Cord: Unchanged flattening of the right aspect of the spinal cord at the T7-T8 level due to a prominent right center disc protrusion. Probable subtle T2 hyperintense signal abnormality within the spinal cord at this level compatible with edema or myelomalacia (series 17, image 10) (series 20, image 23).  Paraspinal and other soft tissues: No abnormality identified within included portions of the thorax or upper abdomen/retroperitoneum.  Disc levels:  Unless otherwise stated, the level by level findings below have not significantly changed since prior MRI 11/01/2018.  Mild-to-moderate disc degeneration throughout the thoracic spine. Moderate disc degeneration at T12-L1.  There are shallow multilevel disc bulges. Mild multilevel facet arthrosis,  predominantly within the lower thoracic spine. Additional notable level by level findings as below.  T3-T4: Small central disc protrusion. The disc protrusion partially effaces the ventral thecal sac, but there is no significant spinal cord mass effect or central canal stenosis.  T7-T8: Prominent right center disc protrusion flattening the right aspect of the spinal cord. Overall moderate central canal stenosis.  T8-T9: Left center disc protrusion minimally effacing the left ventral thecal sac. No significant spinal cord mass effect or neural foraminal narrowing.  T9-T10: Left center disc protrusion slightly extending into the left neural foramen. Mild partial effacement of the left ventral thecal sac without cord mass effect. No significant  foraminal stenosis.  T11-T12: Disc bulge asymmetric to the right. Prominent right ventrolateral osteophyte. Mild facet arthrosis/ligamentum flavum hypertrophy. No significant spinal canal stenosis. Moderate right neural foraminal narrowing.  T12-L1: Disc bulge asymmetric to the right. Mild facet arthrosis. No significant spinal canal stenosis. Moderate right neural foraminal narrowing.  L1-L2: This level is imaged sagittally. Disc bulge with minimal endplate spurring. Mild facet arthrosis. Mild bilateral neural foraminal narrowing.  IMPRESSION: Unchanged chronic T12 and L1 vertebral compression fractures.  Thoracic spondylosis as outlined and having not significantly changed since the MRI of 11/01/2018. Findings are most notably as follows.  At T7-T8, a prominent right center disc protrusion significantly flattens the right aspect of the spinal cord. As before, there is probable subtle spinal cord signal abnormality at this level, which may reflect edema or myelomalacia.  No more than mild spinal canal narrowing at the remaining levels. Multifactorial moderate right neural foraminal narrowing at T11-T12 and T12-L1.  3 mm acute/subacute Schmorl node within the T8 inferior endplate.  Electronically Signed   By: Kellie Simmering DO   On: 01/10/2020 13:50 Note: Reviewed        Physical Exam  General appearance: Well nourished, well developed, and well hydrated. In no apparent acute distress Mental status: Alert, oriented x 3 (person, place, & time)       Respiratory: No evidence of acute respiratory distress Eyes: PERLA Vitals: BP 134/75 (BP Location: Left Arm, Patient Position: Sitting, Cuff Size: Large)   Pulse 79   Temp (!) 97.1 F (36.2 C) (Temporal)   Resp 16   Ht 5' 6"  (1.676 m)   Wt 220 lb (99.8 kg)   SpO2 96%   BMI 35.51 kg/m  BMI: Estimated body mass index is 35.51 kg/m as calculated from the following:   Height as of this encounter: 5' 6"  (1.676 m).   Weight  as of this encounter: 220 lb (99.8 kg). Ideal: Ideal body weight: 59.3 kg (130 lb 11.7 oz) Adjusted ideal body weight: 75.5 kg (166 lb 7 oz)   Lumbar Spine Area Exam  Skin & Axial Inspection:No masses, redness, or swelling Alignment:Symmetrical Functional FXT:KWIO restricted ROMaffecting both sides Stability:No instability detected Muscle Tone/Strength:Functionally intact. No obvious neuro-muscular anomalies detected. Sensory (Neurological):Dermatomal pain patternand neurogenic Palpation:No palpable anomalies Provocative Tests: Hyperextension/rotation test:(+)bilaterally for facet joint pain. Lumbar quadrant test (Kemp's test):(+)bilateral for foraminal stenosis  Gait & Posture Assessment  Ambulation:Patient ambulates using a cane Gait:Significantly limited. Dependent on assistive device to ambulate Posture:Difficulty standing up straight, due to pain Lower Extremity Exam    Side:Right lower extremity  Side:Left lower extremity  Stability:No instability observed  Stability:No instability observed  Skin & Extremity Inspection:Skin color, temperature, and hair growth are WNL. No peripheral edema or cyanosis. No masses, redness, swelling, asymmetry, or associated skin lesions. No contractures.  Skin & Extremity Inspection:Skin color,  temperature, and hair growth are WNL. No peripheral edema or cyanosis. No masses, redness, swelling, asymmetry, or associated skin lesions. No contractures.  Functional MNO:TRRN restricted ROMfor hip and knee joints   Functional HAF:BXUX restricted ROMfor hip and knee joints   Muscle Tone/Strength:Functionally intact. No obvious neuro-muscular anomalies detected.  Muscle Tone/Strength:Functionally intact. No obvious neuro-muscular anomalies detected.  Sensory (Neurological):Musculoskeletal pain pattern  Sensory (Neurological):Musculoskeletal pain pattern   DTR: Patellar:deferred today Achilles:deferred today Plantar:deferred today  DTR: Patellar:deferred today Achilles:deferred today Plantar:deferred today  Palpation:No palpable anomalies  Palpation:No palpable anomalies    Assessment   Status Diagnosis  Persistent Persistent Persistent 1. Lumbar foraminal stenosis (RIGHT L1)   2. Spinal stenosis of lumbar region with neurogenic claudication (L4-L5)   3. Chronic pain syndrome   4. Compression fracture of body of thoracic vertebra (HCC)   5. Compression fracture of L1 vertebra, sequela   6. Type II diabetes mellitus with renal manifestations, uncontrolled ([place])   7. Cerebrovascular accident (CVA) due to bilateral thrombosis of carotid arteries ([place])   8. Cerebrovascular accident (CVA) due to thrombosis of left middle cerebral artery ([place])   9. Morbid obesity due to excess calories [facility])        Plan of Care   [name] has a current medication list which includes the following long-term medication(s): apixaban, atorvastatin, glyburide-metformin, hydrochlorothiazide, loratadine, losartan, paroxetine, and gabapentin.  Discussed the importance of optimizing blood glucose management.  Also discussed the importance of smoking cessation in the context of chronic pain. Smoking cessation instruction/counseling given:  counseled patient on the dangers of tobacco use, advised patient to stop smoking, and reviewed strategies to maximize success  Pharmacotherapy (Medications Ordered): Meds ordered this encounter  Medications  . traMADol (ULTRAM) 50 MG tablet    Sig: Take 1 tablet (50 mg total) by mouth 2 (two) times daily as needed for severe pain. Month last 30 days.    Dispense:  60 tablet    Refill:  2    [place] STOP ACT - Not applicable. Fill one day early if pharmacy is closed on scheduled refill date.    Follow-up plan:   Return if symptoms worsen or fail to improve.   Recent Visits No visits were found meeting  these conditions. Showing recent visits within past 90 days and meeting all other requirements Today's Visits Date Type Provider Dept  04/01/20 Office Visit Jocelyn Santa, MD [facility]  Showing today's visits and meeting all other requirements Future Appointments No visits were found meeting these conditions. Showing future appointments within next 90 days and meeting all other requirements  I discussed the assessment and treatment plan with the patient. The patient was provided an opportunity to ask questions and all were answered. The patient agreed with the plan and demonstrated an understanding of the instructions.  Patient advised to call back or seek an in-person evaluation if the symptoms or condition worsens.  Duration of encounter: 53mnutes.  Note by: BGillis Santa MD Date: 04/01/2020; Time: 3:06 PM

## 2020-04-01 NOTE — Chronic Care Management (AMB) (Signed)
Chronic Care Management   Initial Visit Note   Name: Jocelyn Wells MRN: [id number] DOB: 1955-12-22  Primary Care Provider: Margo Common, PA-C Reason for referral : Chronic Care Management  Jocelyn Wells is a 65 y.o. year old female who is a primary care patient of Chrismon, Vickki Muff, PA-C. The CCM team was consulted for assistance with chronic disease management and care coordination.  Review of Jocelyn Wells' status, including review of consultants reports, relevant labs and test results was conducted today. Collaboration with appropriate care team members was performed as part of the comprehensive evaluation and provision of chronic care management services.     SDOH (Social Determinants of Health) assessments performed: Yes See Care Plan activities for detailed interventions related to SDOH  SDOH Interventions   Flowsheet Row Most Recent Value  SDOH Interventions   Food Insecurity Interventions Intervention Not Indicated  Transportation Interventions Intervention Not Indicated       Medications: Outpatient Encounter Medications as of 02/14/2020  Medication Sig  . apixaban (ELIQUIS) 2.5 MG TABS tablet Take 1 tablet (2.5 mg total) by mouth 2 (two) times daily.  Marland Kitchen atorvastatin (LIPITOR) 40 MG tablet Take 1 tablet (40 mg total) by mouth daily.  Marland Kitchen glyBURIDE-metformin (GLUCOVANCE) 5-500 MG per tablet Take 2 tablets by mouth 2 (two) times daily.   . hydrochlorothiazide (HYDRODIURIL) 25 MG tablet Take 25 mg by mouth daily.  . insulin glargine (LANTUS) 100 UNIT/ML injection Inject 40 Units into the skin daily.  Marland Kitchen loratadine (CLARITIN) 10 MG tablet Take 1 tablet by mouth daily.  Marland Kitchen losartan (COZAAR) 25 MG tablet Take 2 tablets (50 mg total) by mouth daily.  Marland Kitchen OZEMPIC, 1 MG/DOSE, 2 MG/1.5ML SOPN   . PARoxetine (PAXIL) 40 MG tablet Take 1 tablet (40 mg total) by mouth daily.  . traMADol (ULTRAM) 50 MG tablet Take 1 tablet (50 mg total) by mouth 2 (two) times daily as needed for severe  pain. Month last 30 days.  . Vitamin D, Ergocalciferol, (DRISDOL) 1.25 MG (50000 UT) CAPS capsule Take 50,000 Units by mouth once a week.  . B-D INS SYRINGE 0.5CC/31GX5/16 31G X 5/16" 0.5 ML MISC   . calcipotriene (DOVONOX) 0.005 % cream Apply topically as needed. For psoriasis flares (Patient not taking: Reported on 02/14/2020)  . denosumab (PROLIA) 60 MG/ML SOSY injection Inject 60 mg into the skin every 6 (six) months.  . gabapentin (NEURONTIN) 300 MG capsule Take 1 capsule (300 mg total) by mouth daily. (Patient taking differently: Take 300 mg by mouth 2 (two) times daily. )  . glucose blood test strip OneTouch Verio strips  . mupirocin ointment (BACTROBAN) 2 % mupirocin 2 % topical ointment  . NOVOLIN 70/30 RELION (70-30) 100 UNIT/ML injection Inject 10-50 Units into the skin 2 (two) times daily with a meal. 10units-AM,  40-50units-PM  . Semaglutide, 1 MG/DOSE, 2 MG/1.5ML SOPN Inject into the skin. (Patient not taking: Reported on 12/06/2019)  . triamcinolone cream (KENALOG) 0.1 % Apply 1 application topically as needed (for psoriasis flares).  (Patient not taking: Reported on 12/06/2019)   No facility-administered encounter medications on file as of 02/14/2020.     Objective:  Patient Care Plan: COPD (Adult)    Problem Identified: Symptom Exacerbation (COPD)     Long-Range Goal: Symptom Exacerbation Prevented or Minimized   Start Date: 02/14/2020  Expected End Date: 06/13/2020  Priority: High  Note:   Current Barriers:  . Chronic Disease Management needs and support r/t COPD self management . Tobacco  Use (1 pack of cigarettes/day)  Case Manager Clinical Goal(s):  Over the next 120 days, patient will not require hospitalization or emergent care d/t complications r/t COPD exacerbation.  Interventions:   Collaboration with Chrismon, Vickki Muff, PA-C regarding development and update of comprehensive plan of care as evidenced by provider attestation and co-signature  Inter-disciplinary  care team collaboration (see longitudinal plan of care)  Advised to self assesses COPD action plan zone. Advised to be evaluated by provider if in the yellow zone for 48 hours without improvement.  Discussed COPD exacerbation triggers. Reviewed worsening s/sx that require immediate medical attention.  Discussed activity tolerance and importance of refraining from prolonged or over strenuous activities.   Discussed increased risk for complications d/t current tobacco use. Provided information regarding available counseling and smoking cessation resources.   Provided information regarding infection prevention strategies and measure to reduce risk of respiratory infection   Patient Goals/Self-Care Activities:  Over the next 120 days, patient will: - Take medications as prescribed - Consider plan for smoking cessation - Follow plan for COPD self management - Contact provider or care management team with new concerns or questions  Follow Up Plan:  Will follow-up next month   Patient Care Plan: Heart Failure (Adult)    Problem Identified: Symptom Exacerbation (Heart Failure)     Long-Range Goal: Symptom Exacerbation Prevented or Minimized   Start Date: 02/14/2020  Expected End Date: 06/13/2020  Priority: High  Note:   Current Barriers:  . Chronic Disease Management needs and support r/t Heart Failure   Nurse Case Manager Clinical Goal(s):   Over the next 120 patient will not require hospitalization for complications r/t Heart Failure exacerbation  Interventions:  . Collaboration with Chrismon, Vickki Muff, PA-C regarding development and update of comprehensive plan of care as evidenced by provider attestation and co-signature . Inter-disciplinary care team collaboration (see longitudinal plan of care) . Reviewed medications. Encouraged to take as prescribed. Advised to notify provider if unable to tolerate prescribed regimen . Discussed current plan for Heart Failure self management.  Discussed importance of weighing daily and recording readings. Advised to notify provider for weight gain greater than 3 lbs overnight or 5 lbs within a week. . Reviewed s/sx of fluid overload. Discussed indications for seeking immediate medical attention . Discussed dietary habits and importance of monitoring sodium intake. Encouraged to read nutrition labels and adhere to recommended diet.  Patient Goals/Self-Care Activities Over the next 120 days, patient will:  - Take medications as prescribed - Follow recommended plan for CHF management - Monitor and record weights - Notify provider with weight gain greater than 3 lbs overnight or 5 lbs in a week - Contact provider or care management team with new questions or concerns  Follow Up Plan:  Will follow up next month   Patient Care Plan: Diabetes Type 2 (Adult)    Problem Identified: Glycemic Management (Diabetes, Type 2)     Long-Range Goal: Glycemic Management Optimized   Start Date: 02/14/2020  Expected End Date: 06/13/2020  Priority: High  Note:    Current Barriers:  Marland Kitchen Knowledge Deficits related to basic Diabetes pathophysiology and self care/management  Case Manager Clinical Goal(s):  Over the next 120 days, patient will: Marland Kitchen Demonstrate improved adherence to prescribed treatment plan for diabetes self care/management as evidenced by daily monitoring and recording of CBG, adhering to recommended ADA/ carb modified diet, and adhering to prescribed medication regimen. . Lower A1C by at least 1 point.  Interventions:  . Collaboration with  Chrismon, Vickki Muff, PA-C regarding development and update of comprehensive plan of care as evidenced by provider attestation and co-signature . Inter-disciplinary care team collaboration (see longitudinal plan of care) . Reviewed medications. Encouraged to take medications and administer insulin as prescribed.  . Provided information regarding importance of consistent blood glucose monitoring.  Advised to monitor as recommended and maintain a log.  . Reviewed s/sx of hypoglycemia and hyperglycemia along with recommended interventions. Discussed importance of monitoring readings prior to administering insulin. Reports fasting reading today of 187 mg/dl. . Discussed nutritional intake and importance of complying with the recommended diabetic diet. Discussed importance of reading nutrition labels and avoiding heavily processed foods and concentrated sugars. . Discussed and offered referrals for available Diabetes education classes. Reports completing diabetes and nutritional classes in the past. Declined need for additional resources.  . Discussed importance of completing recommended DM preventive care. Reports completing annual eye exam approximately two months ago. Reports performing foot care as recommended.  Patient Goals/Self-Care Activities Over the next 120 days, patient will:  - Self administer oral medications and insulin as prescribed - Attend all scheduled provider appointments - Monitor blood glucose levels consistently and utilize recommended interventions - Adhere to prescribed ADA/carb modified - Notify provider or care management team with health related questions and concern as needed  Follow Up Plan:  -Will follow up next month   Patient Care Plan: Fall Risk (Adult)    Problem Identified: Fall Risk     Long-Range Goal: Absence of Fall and Fall-Related Injury   Start Date: 02/14/2020  Expected End Date: 06/13/2020  Priority: High  Note:   Current Barriers:  Marland Kitchen Moderate Risk for Falls  Clinical Goal(s):  Marland Kitchen Over the next 120 days patient will not require hospitalization or emergent care for complications r/t falls.  Interventions:  . Collaboration with Chrismon, Vickki Muff, PA-C regarding development and update of comprehensive plan of care as evidenced by provider attestation and co-signature . Inter-disciplinary care team collaboration (see longitudinal plan of  care) . Assessed for falls . Reviewed medications and discussed potential side effects of medications such as dizziness, drowsiness and frequent urination . Provided information regarding safety and fall prevention measures . Discussed activity tolerance and importance of utilizing the appropriate assistive device when ambulating. Encouraged to continue using cane and consider obtaining a walker. Encouraged to have electric scooter available when traveling away from the home. . Discussed ability to perform ADL's and small tasks in the home. Encouraged to avoid attempting to lift heavy objects or engaging in chores that may cause overexertion. Reports family is currently available to assist as needed. Encouraged to keep the care management team updated of in-home needs.  Patient Goals/Self-Care Activities Over the next 120 days, patient will:   - Utilize appropriate assistive device with all ambulation - Change positions slowly and allow time to adjust when standing - Wear secure non skid footwear at all times when ambulating - Keep pathways clear and well lit  Follow Up Plan:  Will follow up next month     Ms. Paradiso was given information about Chronic Care Management services including:  1. CCM service includes personalized support from designated clinical staff supervised by her physician, including individualized plan of care and coordination with other care providers 2. 24/7 contact phone numbers for assistance for urgent and routine care needs. 3. Service will only be billed when office clinical staff spend 20 minutes or more in a month to coordinate care. 4. Only one practitioner may  furnish and bill the service in a calendar month. 5. The patient may stop CCM services at any time (effective at the end of the month) by phone call to the office staff. 6. The patient will be responsible for cost sharing (co-pay) of up to 20% of the service fee (after annual deductible is  met).  Patient agreed to services and verbal consent obtained.      PLAN A member of the care management team will follow up with Mrs. Roye next month.   [facility] Care Management [facility] [phone]

## 2020-04-01 NOTE — Progress Notes (Signed)
Nursing Pain Medication Assessment:  Safety precautions to be maintained throughout the outpatient stay will include: orient to surroundings, keep bed in low position, maintain call bell within reach at all times, provide assistance with transfer out of bed and ambulation.  Medication Inspection Compliance: Pill count conducted under aseptic conditions, in front of the patient. Neither the pills nor the bottle was removed from the patient's sight at any time. Once count was completed pills were immediately returned to the patient in their original bottle.  Medication: Tramadol (Ultram) Pill/Patch Count: 21 of 60 pills remain Pill/Patch Appearance: Markings consistent with prescribed medication Bottle Appearance: Standard pharmacy container. Clearly labeled. Filled Date: 1 / 10 / 22 Last Medication intake:  TodaySafety precautions to be maintained throughout the outpatient stay will include: orient to surroundings, keep bed in low position, maintain call bell within reach at all times, provide assistance with transfer out of bed and ambulation.

## 2020-04-01 NOTE — Patient Instructions (Signed)
Thank you for allowing the Chronic Care Management team to participate in your care.    Patient Care Plan: COPD (Adult)    Problem Identified: Symptom Exacerbation (COPD)     Long-Range Goal: Symptom Exacerbation Prevented or Minimized   Start Date: 02/14/2020  Expected End Date: 06/13/2020  Priority: High  Note:   Current Barriers:  . Chronic Disease Management needs and support r/t COPD self management . Tobacco Use (1 pack of cigarettes/day)  Case Manager Clinical Goal(s):  Over the next 120 days, patient will not require hospitalization or emergent care d/t complications r/t COPD exacerbation.  Interventions:   Collaboration with Chrismon, Vickki Muff, PA-C regarding development and update of comprehensive plan of care as evidenced by provider attestation and co-signature  Inter-disciplinary care team collaboration (see longitudinal plan of care)  Advised to self assesses COPD action plan zone. Advised to be evaluated by provider if in the yellow zone for 48 hours without improvement.  Discussed COPD exacerbation triggers. Reviewed worsening s/sx that require immediate medical attention.  Discussed activity tolerance and importance of refraining from prolonged or over strenuous activities.   Discussed increased risk for complications d/t current tobacco use. Provided information regarding available counseling and smoking cessation resources.   Provided information regarding infection prevention strategies and measure to reduce risk of respiratory infection   Patient Goals/Self-Care Activities:  Over the next 120 days, patient will: - Take medications as prescribed - Consider plan for smoking cessation - Follow plan for COPD self management - Contact provider or care management team with new concerns or questions  Follow Up Plan:  Will follow-up next month   Patient Care Plan: Heart Failure (Adult)    Problem Identified: Symptom Exacerbation (Heart Failure)      Long-Range Goal: Symptom Exacerbation Prevented or Minimized   Start Date: 02/14/2020  Expected End Date: 06/13/2020  Priority: High  Note:   Current Barriers:  . Chronic Disease Management needs and support r/t Heart Failure   Nurse Case Manager Clinical Goal(s):   Over the next 120 patient will not require hospitalization for complications r/t Heart Failure exacerbation  Interventions:  . Collaboration with Chrismon, Vickki Muff, PA-C regarding development and update of comprehensive plan of care as evidenced by provider attestation and co-signature . Inter-disciplinary care team collaboration (see longitudinal plan of care) . Reviewed medications. Encouraged to take as prescribed. Advised to notify provider if unable to tolerate prescribed regimen . Discussed current plan for Heart Failure self management. Discussed importance of weighing daily and recording readings. Advised to notify provider for weight gain greater than 3 lbs overnight or 5 lbs within a week. . Reviewed s/sx of fluid overload. Discussed indications for seeking immediate medical attention . Discussed dietary habits and importance of monitoring sodium intake. Encouraged to read nutrition labels and adhere to recommended diet.  Patient Goals/Self-Care Activities Over the next 120 days, patient will:  - Take medications as prescribed - Follow recommended plan for CHF management - Monitor and record weights - Notify provider with weight gain greater than 3 lbs overnight or 5 lbs in a week - Contact provider or care management team with new questions or concerns  Follow Up Plan:  Will follow up next month   Patient Care Plan: Diabetes Type 2 (Adult)    Problem Identified: Glycemic Management (Diabetes, Type 2)     Long-Range Goal: Glycemic Management Optimized   Start Date: 02/14/2020  Expected End Date: 06/13/2020  Priority: High  Note:    Current Barriers:  .  Knowledge Deficits related to basic Diabetes  pathophysiology and self care/management  Case Manager Clinical Goal(s):  Over the next 120 days, patient will: Marland Kitchen Demonstrate improved adherence to prescribed treatment plan for diabetes self care/management as evidenced by daily monitoring and recording of CBG, adhering to recommended ADA/ carb modified diet, and adhering to prescribed medication regimen. . Lower A1C by at least 1 point.  Interventions:  . Collaboration with Chrismon, Vickki Muff, PA-C regarding development and update of comprehensive plan of care as evidenced by provider attestation and co-signature . Inter-disciplinary care team collaboration (see longitudinal plan of care) . Reviewed medications. Encouraged to take medications and administer insulin as prescribed.  . Provided information regarding importance of consistent blood glucose monitoring. Advised to monitor as recommended and maintain a log.  . Reviewed s/sx of hypoglycemia and hyperglycemia along with recommended interventions. Discussed importance of monitoring readings prior to administering insulin. Reports fasting reading today of 187 mg/dl. . Discussed nutritional intake and importance of complying with the recommended diabetic diet. Discussed importance of reading nutrition labels and avoiding heavily processed foods and concentrated sugars. . Discussed and offered referrals for available Diabetes education classes. Reports completing diabetes and nutritional classes in the past. Declined need for additional resources.  . Discussed importance of completing recommended DM preventive care. Reports completing annual eye exam approximately two months ago. Reports performing foot care as recommended.  Patient Goals/Self-Care Activities Over the next 120 days, patient will:  - Self administer oral medications and insulin as prescribed - Attend all scheduled provider appointments - Monitor blood glucose levels consistently and utilize recommended interventions -  Adhere to prescribed ADA/carb modified - Notify provider or care management team with health related questions and concern as needed  Follow Up Plan:  -Will follow up next month   Patient Care Plan: Fall Risk (Adult)    Problem Identified: Fall Risk     Long-Range Goal: Absence of Fall and Fall-Related Injury   Start Date: 02/14/2020  Expected End Date: 06/13/2020  Priority: High  Note:   Current Barriers:  Marland Kitchen Moderate Risk for Falls  Clinical Goal(s):  Marland Kitchen Over the next 120 days patient will not require hospitalization or emergent care for complications r/t falls.  Interventions:  . Collaboration with Chrismon, Vickki Muff, PA-C regarding development and update of comprehensive plan of care as evidenced by provider attestation and co-signature . Inter-disciplinary care team collaboration (see longitudinal plan of care) . Assessed for falls . Reviewed medications and discussed potential side effects of medications such as dizziness, drowsiness and frequent urination . Provided information regarding safety and fall prevention measures . Discussed activity tolerance and importance of utilizing the appropriate assistive device when ambulating. Encouraged to continue using cane and consider obtaining a walker. Encouraged to have electric scooter available when traveling away from the home. . Discussed ability to perform ADL's and small tasks in the home. Encouraged to avoid attempting to lift heavy objects or engaging in chores that may cause overexertion. Reports family is currently available to assist as needed. Encouraged to keep the care management team updated of in-home needs.  Patient Goals/Self-Care Activities Over the next 120 days, patient will:   - Utilize appropriate assistive device with all ambulation - Change positions slowly and allow time to adjust when standing - Wear secure non skid footwear at all times when ambulating - Keep pathways clear and well lit  Follow Up Plan:   Will follow up next month     Ms. Vanvoorhis was given  information about Chronic Care Management services including:  1. CCM service includes personalized support from designated clinical staff supervised by her physician, including individualized plan of care and coordination with other care providers 2. 24/7 contact phone numbers for assistance for urgent and routine care needs. 3. Service will only be billed when office clinical staff spend 20 minutes or more in a month to coordinate care. 4. Only one practitioner may furnish and bill the service in a calendar month. 5. The patient may stop CCM services at any time (effective at the end of the month) by phone call to the office staff. 6. The patient will be responsible for cost sharing (co-pay) of up to 20% of the service fee (after annual deductible is met).  Patient agreed to services and verbal consent obtained.     Mrs. Simien verbalized understanding of the information discussed during the telephonic outreach today. Declined need for mailed/printed resources. A member of the care management team will follow up with Mrs. Lockridge next month.   [facility] Care Management [facility] [phone]

## 2020-04-10 ENCOUNTER — Telehealth: Payer: Self-pay

## 2020-04-10 NOTE — Telephone Encounter (Signed)
  Chronic Care Management   Outreach Note  04/10/2020 Name: Jocelyn Wells MRN: [id number] DOB: [birth year]  Primary Care Provider: Margo Common, PA-C Reason for referral : Chronic Care Management   An unsuccessful telephone outreach was attempted today. She is currently enrolled in the Chronic Care Management program.    Follow Up Plan:  A HIPAA compliant voice message was left today requesting a return call.    [facility] Care Management [facility] [phone]

## 2020-04-14 DIAGNOSIS — E113313 Type 2 diabetes mellitus with moderate nonproliferative diabetic retinopathy with macular edema, bilateral: Secondary | ICD-10-CM | POA: Diagnosis not present

## 2020-04-14 DIAGNOSIS — H40023 Open angle with borderline findings, high risk, bilateral: Secondary | ICD-10-CM | POA: Diagnosis not present

## 2020-04-16 DIAGNOSIS — M810 Age-related osteoporosis without current pathological fracture: Secondary | ICD-10-CM | POA: Diagnosis not present

## 2020-04-28 DIAGNOSIS — E1142 Type 2 diabetes mellitus with diabetic polyneuropathy: Secondary | ICD-10-CM | POA: Diagnosis not present

## 2020-04-28 DIAGNOSIS — F17200 Nicotine dependence, unspecified, uncomplicated: Secondary | ICD-10-CM | POA: Diagnosis not present

## 2020-04-28 DIAGNOSIS — I10 Essential (primary) hypertension: Secondary | ICD-10-CM | POA: Diagnosis not present

## 2020-04-28 DIAGNOSIS — E1159 Type 2 diabetes mellitus with other circulatory complications: Secondary | ICD-10-CM | POA: Diagnosis not present

## 2020-04-28 DIAGNOSIS — E1122 Type 2 diabetes mellitus with diabetic chronic kidney disease: Secondary | ICD-10-CM | POA: Diagnosis not present

## 2020-04-28 DIAGNOSIS — M810 Age-related osteoporosis without current pathological fracture: Secondary | ICD-10-CM | POA: Diagnosis not present

## 2020-04-28 DIAGNOSIS — N183 Chronic kidney disease, stage 3 unspecified: Secondary | ICD-10-CM | POA: Diagnosis not present

## 2020-04-28 DIAGNOSIS — E1165 Type 2 diabetes mellitus with hyperglycemia: Secondary | ICD-10-CM | POA: Diagnosis not present

## 2020-04-28 DIAGNOSIS — E1129 Type 2 diabetes mellitus with other diabetic kidney complication: Secondary | ICD-10-CM | POA: Diagnosis not present

## 2020-04-28 DIAGNOSIS — Z794 Long term (current) use of insulin: Secondary | ICD-10-CM | POA: Diagnosis not present

## 2020-04-28 DIAGNOSIS — E113393 Type 2 diabetes mellitus with moderate nonproliferative diabetic retinopathy without macular edema, bilateral: Secondary | ICD-10-CM | POA: Diagnosis not present

## 2020-04-28 DIAGNOSIS — E1121 Type 2 diabetes mellitus with diabetic nephropathy: Secondary | ICD-10-CM | POA: Diagnosis not present

## 2020-05-04 ENCOUNTER — Other Ambulatory Visit: Payer: Self-pay | Admitting: Family Medicine

## 2020-05-04 DIAGNOSIS — E1143 Type 2 diabetes mellitus with diabetic autonomic (poly)neuropathy: Secondary | ICD-10-CM

## 2020-05-04 DIAGNOSIS — Z794 Long term (current) use of insulin: Secondary | ICD-10-CM

## 2020-05-04 DIAGNOSIS — I10 Essential (primary) hypertension: Secondary | ICD-10-CM

## 2020-05-05 NOTE — Telephone Encounter (Signed)
Requested medication (s) are due for refill today: yes  Requested medication (s) are on the active medication list: yes  Last refill:  02/06/2020  Future visit scheduled: no  Notes to clinic:  overdue for follow up and labs   Requested Prescriptions  Pending Prescriptions Disp Refills   losartan (COZAAR) 25 MG tablet [Pharmacy Med Name: LOSARTAN POTASSIUM 25 MG TAB] 180 tablet 3    Sig: TAKE 2 TABLETS BY MOUTH EVERY DAY      Cardiovascular:  Angiotensin Receptor Blockers Failed - 05/04/2020  8:09 PM      Failed - Cr in normal range and within 180 days    Creat  Date Value Ref Range Status  03/21/2015 1.37 (H) 0.50 - 0.99 mg/dL Final   Creatinine, Ser  Date Value Ref Range Status  04/23/2019 1.82 (H) 0.57 - 1.00 mg/dL Final          Failed - K in normal range and within 180 days    Potassium  Date Value Ref Range Status  04/23/2019 5.2 3.5 - 5.2 mmol/L Final          Failed - Valid encounter within last 6 months    Recent Outpatient Visits           1 year ago Type 2 diabetes mellitus with diabetic autonomic neuropathy, with long-term current use of insulin ([place])   [facility], Vickki Muff, PA-C   2 years ago Impaired balance as late effect of cerebrovascular accident   [employer], Vickki Muff, PA-C   2 years ago Cerebrovascular accident (CVA) due to embolism of anterior cerebral artery, unspecified blood vessel laterality ([place])   [facility], Vickki Muff, PA-C   2 years ago Acute gout of right ankle, unspecified cause   [facility], Dionne Bucy, MD   3 years ago Subacute pansinusitis   [facility], Vickki Muff, [place] - Patient is not pregnant      Passed - Last BP in normal range    BP Readings from Last 1 Encounters:  04/01/20 134/75

## 2020-05-09 ENCOUNTER — Telehealth (INDEPENDENT_AMBULATORY_CARE_PROVIDER_SITE_OTHER): Payer: Medicare Other | Admitting: Psychiatry

## 2020-05-09 ENCOUNTER — Other Ambulatory Visit: Payer: Self-pay

## 2020-05-09 ENCOUNTER — Other Ambulatory Visit: Payer: Self-pay | Admitting: Family Medicine

## 2020-05-09 ENCOUNTER — Encounter (HOSPITAL_COMMUNITY): Payer: Self-pay | Admitting: Psychiatry

## 2020-05-09 DIAGNOSIS — E785 Hyperlipidemia, unspecified: Secondary | ICD-10-CM

## 2020-05-09 DIAGNOSIS — Z794 Long term (current) use of insulin: Secondary | ICD-10-CM

## 2020-05-09 DIAGNOSIS — F3342 Major depressive disorder, recurrent, in full remission: Secondary | ICD-10-CM | POA: Diagnosis not present

## 2020-05-09 MED ORDER — PAROXETINE HCL 40 MG PO TABS: 40.0000 mg | ORAL_TABLET | Freq: Every day | ORAL | 1 refills | Status: DC

## 2020-05-09 NOTE — Progress Notes (Signed)
[facility] MD OP Progress Note  Virtual Visit via Telephone Note  I connected with Jocelyn Wells on 05/09/20 at 11:00 AM EDT by telephone and verified that I am speaking with the correct person using two identifiers.  Location: Patient: home Provider: Clinic   I discussed the limitations, risks, security and privacy concerns of performing an evaluation and management service by telephone and the availability of in person appointments. I also discussed with the patient that there may be a patient responsible charge related to this service. The patient expressed understanding and agreed to proceed.   I provided 9 minutes of non-face-to-face time during this encounter.   05/09/2020 10:01 AM Jocelyn Wells  MRN:  [id number]  Chief Complaint: "I am doing fine."  HPI: Patient reported that things going well for her.  She reported that her symptoms of depression are in remission.  She is able to sleep well at night.  She is eating well.  She denied any upcoming exciting events. She reported that she does not see any reason to change anything and would like to continue taking the Paxil the way it is.    Visit Diagnosis:    ICD-10-CM   1. MDD (major depressive disorder), recurrent, in full remission ([place])  F33.42     Past Psychiatric History: MDD  Past Medical History:  Past Medical History:  Diagnosis Date  . Anxiety   . Cardiomyopathy [facility])    new to her Jan 2017  . COPD (chronic obstructive pulmonary disease) ([place])   . Depression   . Diabetes mellitus, type II ([place])   . HTN (hypertension)   . Obesity   . Osteoporosis   . PONV (postoperative nausea and vomiting)   . Stroke [facility])    Jan 2017    Past Surgical History:  Procedure Laterality Date  . ABDOMINAL HYSTERECTOMY    . ANKLE FRACTURE SURGERY Left 2002  . BILATERAL SALPINGOOPHORECTOMY  2000  . CARDIAC CATHETERIZATION N/A 03/25/2015   Procedure: Right/Left Heart Cath and Coronary Angiography;  Surgeon: Belva Crome, MD;   Location: [place] CV LAB;  Service: Cardiovascular;  Laterality: N/A;  . [place]  . COLONOSCOPY WITH PROPOFOL N/A 09/22/2016   Procedure: COLONOSCOPY WITH PROPOFOL;  Surgeon: Manya Silvas, MD;  Location: [facility] ENDOSCOPY;  Service: Endoscopy;  Laterality: N/A;  . EP IMPLANTABLE DEVICE N/A 08/05/2015   Procedure: Loop Recorder Insertion;  Surgeon: Deboraha Sprang, MD;  Location: [place] CV LAB;  Service: Cardiovascular;  Laterality: N/A;  . ESOPHAGOGASTRODUODENOSCOPY (EGD) WITH PROPOFOL N/A 09/22/2016   Procedure: ESOPHAGOGASTRODUODENOSCOPY (EGD) WITH PROPOFOL;  Surgeon: Manya Silvas, MD;  Location: [facility] ENDOSCOPY;  Service: Endoscopy;  Laterality: N/A;  . ESOPHAGOGASTRODUODENOSCOPY (EGD) WITH PROPOFOL N/A 12/08/2016   Procedure: ESOPHAGOGASTRODUODENOSCOPY (EGD) WITH PROPOFOL;  Surgeon: Manya Silvas, MD;  Location: [facility] ENDOSCOPY;  Service: Endoscopy;  Laterality: N/A;  . KNEE ARTHROSCOPY Left 2005  . TEE WITHOUT CARDIOVERSION N/A 01/31/2015   Procedure: TRANSESOPHAGEAL ECHOCARDIOGRAM (TEE);  Surgeon: Lelon Perla, MD;  Location: [facility];  Service: Cardiovascular;  Laterality: N/A;  . [place]  . ULNAR NERVE TRANSPOSITION  2008    Family Psychiatric History: see below  Family History:  Family History  Problem Relation Age of Onset  . Heart disease Mother        died from CHF  . Asthma Mother   . Diabetes Mother   . Heart disease Father   . Aneurysm Father   .  COPD Brother   . Diabetes Brother   . Alcohol abuse Paternal Aunt   . Anemia Neg Hx   . Arrhythmia Neg Hx   . Clotting disorder Neg Hx   . Fainting Neg Hx   . Heart attack Neg Hx   . Heart failure Neg Hx   . Hyperlipidemia Neg Hx   . Hypertension Neg Hx     Social History:  Social History   Socioeconomic History  . Marital status: Married    Spouse name: Not on file  . Number of children: 2  . Years of education: Not on file  . Highest education level:  Bachelor's degree (e.g., BA, AB, BS)  Occupational History  . Occupation: disabled    Comment: retired  Tobacco Use  . Smoking status: Current Every Day Smoker    Packs/day: 1.00    Years: 36.00    Pack years: 36.00    Types: Cigarettes    Start date: 02/08/1974  . Smokeless tobacco: Never Used  . Tobacco comment: "I can not quit smoking, I have tried". 1 PPD  Vaping Use  . Vaping Use: Never used  Substance and Sexual Activity  . Alcohol use: No    Alcohol/week: 0.0 standard drinks  . Drug use: No  . Sexual activity: Yes    Birth control/protection: None  Other Topics Concern  . Not on file  Social History Narrative   Lives at home with stepson and husband, dogs and cats   Caffeine  Drinks sweet tea.   Right handed.    Social Determinants of Health   Financial Resource Strain: Not on file  Food Insecurity: No Food Insecurity  . Worried About Charity fundraiser in the Last Year: Never true  . Ran Out of Food in the Last Year: Never true  Transportation Needs: No Transportation Needs  . Lack of Transportation (Medical): No  . Lack of Transportation (Non-Medical): No  Physical Activity: Not on file  Stress: Not on file  Social Connections: Not on file    Allergies:  Allergies  Allergen Reactions  . Codeine Nausea And Vomiting    Metabolic Disorder Labs: Lab Results  Component Value Date   HGBA1C 11.4 03/28/2019   MPG 303.44 10/06/2017   MPG 194 01/30/2015   No results found for: PROLACTIN Lab Results  Component Value Date   CHOL 132 04/23/2019   TRIG 175 (H) 04/23/2019   HDL 43 04/23/2019   CHOLHDL 3.1 04/23/2019   VLDL 64 (H) 10/06/2017   LDLCALC 60 04/23/2019   LDLCALC 88 10/06/2017   No results found for: TSH  Therapeutic Level Labs: No results found for: LITHIUM No results found for: VALPROATE No components found for:  CBMZ  Current Medications: Current Outpatient Medications  Medication Sig Dispense Refill  . apixaban (ELIQUIS) 2.5 MG TABS  tablet Take 1 tablet (2.5 mg total) by mouth 2 (two) times daily. 60 tablet 11  . atorvastatin (LIPITOR) 40 MG tablet TAKE 1 TABLET BY MOUTH EVERY DAY 90 tablet 0  . B-D INS SYRINGE 0.5CC/31GX5/16 31G X 5/16" 0.5 ML MISC     . denosumab (PROLIA) 60 MG/ML SOSY injection Inject 60 mg into the skin every 6 (six) months.    . Dulaglutide (TRULICITY) 1.5 LE/7.5TZ SOPN Inject into the skin.    Marland Kitchen gabapentin (NEURONTIN) 300 MG capsule Take 1 capsule (300 mg total) by mouth daily. (Patient taking differently: Take 300 mg by mouth 2 (two) times daily. ) 90 capsule 3  .  glucose blood test strip OneTouch Verio strips    . glyBURIDE-metformin (GLUCOVANCE) 5-500 MG per tablet Take 2 tablets by mouth 2 (two) times daily.     . hydrochlorothiazide (HYDRODIURIL) 25 MG tablet Take 25 mg by mouth daily.    . insulin aspart (NOVOLOG) 100 UNIT/ML FlexPen Inject into the skin 3 (three) times daily with meals.    . insulin glargine (LANTUS) 100 UNIT/ML injection Inject 40 Units into the skin daily.    Marland Kitchen loratadine (CLARITIN) 10 MG tablet Take 1 tablet by mouth daily.    Marland Kitchen losartan (COZAAR) 25 MG tablet TAKE 2 TABLETS BY MOUTH EVERY DAY 30 tablet 0  . mupirocin ointment (BACTROBAN) 2 % mupirocin 2 % topical ointment    . PARoxetine (PAXIL) 40 MG tablet Take 1 tablet (40 mg total) by mouth daily. 90 tablet 1  . traMADol (ULTRAM) 50 MG tablet Take 1 tablet (50 mg total) by mouth 2 (two) times daily as needed for severe pain. Month last 30 days. 60 tablet 2  . Vitamin D, Ergocalciferol, (DRISDOL) 1.25 MG (50000 UT) CAPS capsule Take 50,000 Units by mouth once a week.     No current facility-administered medications for this visit.    Musculoskeletal: Strength & Muscle Tone: unable to assess due to telemed visit Gait & Station: unable to assess due to telemed visit Patient leans: unable to assess due to telemed visit   Psychiatric Specialty Exam: Review of Systems  There were no vitals taken for this visit.There is  no height or weight on file to calculate BMI.  General Appearance: unable to assess due to phone visit  Eye Contact:  unable to assess due to phone visit  Speech:  Clear and Coherent and Normal Rate  Volume:  Normal  Mood:  Euthymic  Affect:  Congruent  Thought Process:  Goal Directed, Linear and Descriptions of Associations: Intact  Orientation:  Full (Time, Place, and Person)  Thought Content: Logical   Suicidal Thoughts:  No  Homicidal Thoughts:  No  Memory:  Recent;   Good Remote;   Good  Judgement:  Fair  Insight:  Fair  Psychomotor Activity:  Normal  Concentration:  Concentration: Good and Attention Span: Good  Recall:  Good  Fund of Knowledge: Good  Language: Good  Akathisia:  Negative  Handed:  Right  AIMS (if indicated): not done  Assets:  Communication Skills Desire for Improvement Financial Resources/Insurance Housing  ADL's:  Intact  Cognition: WNL  Sleep:  Fair     Screenings: PHQ2-9   [place] Office Visit from 12/06/2019 in [place] Office Visit from 08/21/2019 in [place] from 04/10/2018 in [place] from 12/26/2017 in [place] Office Visit from 11/15/2017 in [place]  PHQ-2 Total Score 0 0 2 0 0  PHQ-9 Total Score -- -- 6 -- --       Assessment and Plan: Pt is stable and in remission of her depression symptoms.   1. MDD (major depressive disorder), recurrent, in full remission ([place])  - PARoxetine (PAXIL) 40 MG tablet; Take 1 tablet (40 mg total) by mouth daily.  Dispense: 90 tablet; Refill: 1   Continue same medication regimen. Follow up in 3 months. Provider informed the patient that her care is being transferred to a different provider in the [facility] psychiatry clinic.  Patient verbalized her understanding.   [place] Crane, MD 05/09/2020,  10:01 AM

## 2020-05-13 ENCOUNTER — Other Ambulatory Visit: Payer: Self-pay | Admitting: Family Medicine

## 2020-05-13 DIAGNOSIS — Z794 Long term (current) use of insulin: Secondary | ICD-10-CM

## 2020-05-13 DIAGNOSIS — I10 Essential (primary) hypertension: Secondary | ICD-10-CM

## 2020-05-22 DIAGNOSIS — E113393 Type 2 diabetes mellitus with moderate nonproliferative diabetic retinopathy without macular edema, bilateral: Secondary | ICD-10-CM | POA: Diagnosis not present

## 2020-05-22 DIAGNOSIS — E1159 Type 2 diabetes mellitus with other circulatory complications: Secondary | ICD-10-CM | POA: Diagnosis not present

## 2020-05-22 DIAGNOSIS — E1129 Type 2 diabetes mellitus with other diabetic kidney complication: Secondary | ICD-10-CM | POA: Diagnosis not present

## 2020-05-22 DIAGNOSIS — R809 Proteinuria, unspecified: Secondary | ICD-10-CM | POA: Diagnosis not present

## 2020-05-22 DIAGNOSIS — F17210 Nicotine dependence, cigarettes, uncomplicated: Secondary | ICD-10-CM | POA: Diagnosis not present

## 2020-05-22 DIAGNOSIS — Z9641 Presence of insulin pump (external) (internal): Secondary | ICD-10-CM | POA: Diagnosis not present

## 2020-05-22 DIAGNOSIS — I10 Essential (primary) hypertension: Secondary | ICD-10-CM | POA: Diagnosis not present

## 2020-05-22 DIAGNOSIS — Z794 Long term (current) use of insulin: Secondary | ICD-10-CM | POA: Diagnosis not present

## 2020-05-22 DIAGNOSIS — E1142 Type 2 diabetes mellitus with diabetic polyneuropathy: Secondary | ICD-10-CM | POA: Diagnosis not present

## 2020-05-22 DIAGNOSIS — E1121 Type 2 diabetes mellitus with diabetic nephropathy: Secondary | ICD-10-CM | POA: Diagnosis not present

## 2020-05-31 ENCOUNTER — Emergency Department: Payer: Medicare Other

## 2020-05-31 ENCOUNTER — Observation Stay
Admission: EM | Admit: 2020-05-31 | Discharge: 2020-06-01 | Disposition: A | Discharge status: Home / Self Care | Payer: Medicare Other | Attending: Internal Medicine | Admitting: Internal Medicine

## 2020-05-31 ENCOUNTER — Other Ambulatory Visit: Payer: Self-pay

## 2020-05-31 ENCOUNTER — Encounter: Payer: Self-pay | Admitting: Internal Medicine

## 2020-05-31 DIAGNOSIS — E1159 Type 2 diabetes mellitus with other circulatory complications: Secondary | ICD-10-CM | POA: Diagnosis present

## 2020-05-31 DIAGNOSIS — I5022 Chronic systolic (congestive) heart failure: Secondary | ICD-10-CM | POA: Diagnosis not present

## 2020-05-31 DIAGNOSIS — Z794 Long term (current) use of insulin: Secondary | ICD-10-CM | POA: Diagnosis not present

## 2020-05-31 DIAGNOSIS — R739 Hyperglycemia, unspecified: Secondary | ICD-10-CM | POA: Diagnosis present

## 2020-05-31 DIAGNOSIS — I130 Hypertensive heart and chronic kidney disease with heart failure and stage 1 through stage 4 chronic kidney disease, or unspecified chronic kidney disease: Secondary | ICD-10-CM | POA: Diagnosis not present

## 2020-05-31 DIAGNOSIS — Z7984 Long term (current) use of oral hypoglycemic drugs: Secondary | ICD-10-CM | POA: Diagnosis not present

## 2020-05-31 DIAGNOSIS — I152 Hypertension secondary to endocrine disorders: Secondary | ICD-10-CM | POA: Diagnosis present

## 2020-05-31 DIAGNOSIS — Z79899 Other long term (current) drug therapy: Secondary | ICD-10-CM | POA: Diagnosis not present

## 2020-05-31 DIAGNOSIS — F17210 Nicotine dependence, cigarettes, uncomplicated: Secondary | ICD-10-CM | POA: Diagnosis not present

## 2020-05-31 DIAGNOSIS — E1165 Type 2 diabetes mellitus with hyperglycemia: Secondary | ICD-10-CM | POA: Diagnosis not present

## 2020-05-31 DIAGNOSIS — N179 Acute kidney failure, unspecified: Secondary | ICD-10-CM | POA: Insufficient documentation

## 2020-05-31 DIAGNOSIS — Z20822 Contact with and (suspected) exposure to covid-19: Secondary | ICD-10-CM | POA: Insufficient documentation

## 2020-05-31 DIAGNOSIS — E1100 Type 2 diabetes mellitus with hyperosmolarity without nonketotic hyperglycemic-hyperosmolar coma (NKHHC): Secondary | ICD-10-CM | POA: Diagnosis not present

## 2020-05-31 DIAGNOSIS — R0902 Hypoxemia: Secondary | ICD-10-CM | POA: Diagnosis not present

## 2020-05-31 DIAGNOSIS — E1122 Type 2 diabetes mellitus with diabetic chronic kidney disease: Secondary | ICD-10-CM | POA: Insufficient documentation

## 2020-05-31 DIAGNOSIS — J449 Chronic obstructive pulmonary disease, unspecified: Secondary | ICD-10-CM | POA: Insufficient documentation

## 2020-05-31 DIAGNOSIS — F32A Depression, unspecified: Secondary | ICD-10-CM

## 2020-05-31 DIAGNOSIS — I10 Essential (primary) hypertension: Secondary | ICD-10-CM | POA: Diagnosis present

## 2020-05-31 DIAGNOSIS — R110 Nausea: Secondary | ICD-10-CM | POA: Diagnosis not present

## 2020-05-31 DIAGNOSIS — Z7901 Long term (current) use of anticoagulants: Secondary | ICD-10-CM | POA: Diagnosis not present

## 2020-05-31 DIAGNOSIS — Z8673 Personal history of transient ischemic attack (TIA), and cerebral infarction without residual deficits: Secondary | ICD-10-CM

## 2020-05-31 DIAGNOSIS — N184 Chronic kidney disease, stage 4 (severe): Secondary | ICD-10-CM

## 2020-05-31 DIAGNOSIS — R1110 Vomiting, unspecified: Secondary | ICD-10-CM | POA: Diagnosis not present

## 2020-05-31 DIAGNOSIS — R112 Nausea with vomiting, unspecified: Secondary | ICD-10-CM | POA: Diagnosis not present

## 2020-05-31 LAB — COMPREHENSIVE METABOLIC PANEL
ALT: 18 U/L (ref 0–44)
AST: 21 U/L (ref 15–41)
Albumin: 3.7 g/dL (ref 3.5–5.0)
Alkaline Phosphatase: 71 U/L (ref 38–126)
Anion gap: 15 (ref 5–15)
BUN: 44 mg/dL — ABNORMAL HIGH (ref 8–23)
CO2: 24 mmol/L (ref 22–32)
Calcium: 9.2 mg/dL (ref 8.9–10.3)
Chloride: 88 mmol/L — ABNORMAL LOW (ref 98–111)
Creatinine, Ser: 2.49 mg/dL — ABNORMAL HIGH (ref 0.44–1.00)
GFR, Estimated: 21 mL/min — ABNORMAL LOW (ref >60)
Glucose, Bld: 778 mg/dL (ref 70–99)
Potassium: 4.3 mmol/L (ref 3.5–5.1)
Sodium: 127 mmol/L — ABNORMAL LOW (ref 135–145)
Total Bilirubin: 0.6 mg/dL (ref 0.3–1.2)
Total Protein: 7.1 g/dL (ref 6.5–8.1)

## 2020-05-31 LAB — CBC WITH DIFFERENTIAL/PLATELET
Abs Immature Granulocytes: 0.03 10*3/uL (ref 0.00–0.07)
Basophils Absolute: 0.1 10*3/uL (ref 0.0–0.1)
Basophils Relative: 1 %
Eosinophils Absolute: 0.3 10*3/uL (ref 0.0–0.5)
Eosinophils Relative: 2 %
HCT: 43.1 % (ref 36.0–46.0)
Hemoglobin: 13.8 g/dL (ref 12.0–15.0)
Immature Granulocytes: 0 %
Lymphocytes Relative: 17 %
Lymphs Abs: 2 10*3/uL (ref 0.7–4.0)
MCH: 28.4 pg (ref 26.0–34.0)
MCHC: 32 g/dL (ref 30.0–36.0)
MCV: 88.7 fL (ref 80.0–100.0)
Monocytes Absolute: 0.7 10*3/uL (ref 0.1–1.0)
Monocytes Relative: 7 %
Neutro Abs: 8.4 10*3/uL — ABNORMAL HIGH (ref 1.7–7.7)
Neutrophils Relative %: 73 %
Platelets: 406 10*3/uL — ABNORMAL HIGH (ref 150–400)
RBC: 4.86 MIL/uL (ref 3.87–5.11)
RDW: 15.1 % (ref 11.5–15.5)
WBC: 11.5 10*3/uL — ABNORMAL HIGH (ref 4.0–10.5)
nRBC: 0 % (ref 0.0–0.2)

## 2020-05-31 LAB — GLUCOSE, CAPILLARY
Glucose-Capillary: 530 mg/dL (ref 70–99)
Glucose-Capillary: 469 mg/dL — ABNORMAL HIGH (ref 70–99)
Glucose-Capillary: 266 mg/dL — ABNORMAL HIGH (ref 70–99)
Glucose-Capillary: 403 mg/dL — ABNORMAL HIGH (ref 70–99)
Glucose-Capillary: 257 mg/dL — ABNORMAL HIGH (ref 70–99)
Glucose-Capillary: 58 mg/dL — ABNORMAL LOW (ref 70–99)
Glucose-Capillary: 104 mg/dL — ABNORMAL HIGH (ref 70–99)

## 2020-05-31 LAB — BASIC METABOLIC PANEL
Anion gap: 11 (ref 5–15)
Anion gap: 12 (ref 5–15)
BUN: 40 mg/dL — ABNORMAL HIGH (ref 8–23)
BUN: 40 mg/dL — ABNORMAL HIGH (ref 8–23)
CO2: 25 mmol/L (ref 22–32)
CO2: 26 mmol/L (ref 22–32)
Calcium: 9.2 mg/dL (ref 8.9–10.3)
Calcium: 9 mg/dL (ref 8.9–10.3)
Chloride: 97 mmol/L — ABNORMAL LOW (ref 98–111)
Chloride: 94 mmol/L — ABNORMAL LOW (ref 98–111)
Creatinine, Ser: 2.02 mg/dL — ABNORMAL HIGH (ref 0.44–1.00)
Creatinine, Ser: 1.88 mg/dL — ABNORMAL HIGH (ref 0.44–1.00)
GFR, Estimated: 27 mL/min — ABNORMAL LOW (ref >60)
GFR, Estimated: 29 mL/min — ABNORMAL LOW (ref >60)
Glucose, Bld: 113 mg/dL — ABNORMAL HIGH (ref 70–99)
Glucose, Bld: 383 mg/dL — ABNORMAL HIGH (ref 70–99)
Potassium: 4 mmol/L (ref 3.5–5.1)
Potassium: 4.5 mmol/L (ref 3.5–5.1)
Sodium: 133 mmol/L — ABNORMAL LOW (ref 135–145)
Sodium: 132 mmol/L — ABNORMAL LOW (ref 135–145)

## 2020-05-31 LAB — URINALYSIS, ROUTINE W REFLEX MICROSCOPIC
Bilirubin Urine: NEGATIVE
Glucose, UA: >=500 mg/dL — AB
Ketones, ur: NEGATIVE mg/dL
Leukocytes,Ua: NEGATIVE
Nitrite: NEGATIVE
Protein, ur: 100 mg/dL — AB
Specific Gravity, Urine: 1.017 (ref 1.005–1.030)
pH: 5 (ref 5.0–8.0)

## 2020-05-31 LAB — BETA-HYDROXYBUTYRIC ACID: Beta-Hydroxybutyric Acid: 0.08 mmol/L (ref 0.05–0.27)

## 2020-05-31 LAB — BLOOD GAS, VENOUS
Acid-Base Excess: 1.4 mmol/L (ref 0.0–2.0)
Bicarbonate: 28.1 mmol/L — ABNORMAL HIGH (ref 20.0–28.0)
O2 Saturation: 68.2 %
Patient temperature: 37
pCO2, Ven: 52 mmHg (ref 44.0–60.0)
pH, Ven: 7.34 (ref 7.250–7.430)
pO2, Ven: 38 mmHg (ref 32.0–45.0)

## 2020-05-31 LAB — OSMOLALITY: Osmolality: 304 mOsm/kg — ABNORMAL HIGH (ref 275–295)

## 2020-05-31 LAB — LACTIC ACID, PLASMA
Lactic Acid, Venous: 1.8 mmol/L (ref 0.5–1.9)
Lactic Acid, Venous: 1.4 mmol/L (ref 0.5–1.9)

## 2020-05-31 LAB — CBG MONITORING, ED
Glucose-Capillary: >600 mg/dL (ref 70–99)
Glucose-Capillary: 263 mg/dL — ABNORMAL HIGH (ref 70–99)
Glucose-Capillary: 132 mg/dL — ABNORMAL HIGH (ref 70–99)
Glucose-Capillary: 115 mg/dL — ABNORMAL HIGH (ref 70–99)

## 2020-05-31 LAB — HIV ANTIBODY (ROUTINE TESTING W REFLEX): HIV Screen 4th Generation wRfx: NONREACTIVE

## 2020-05-31 LAB — RESP PANEL BY RT-PCR (FLU A&B, COVID) ARPGX2
Influenza A by PCR: NEGATIVE
Influenza B by PCR: NEGATIVE
SARS Coronavirus 2 by RT PCR: NEGATIVE

## 2020-05-31 MED ORDER — ENOXAPARIN SODIUM 40 MG/0.4ML ~~LOC~~ SOLN
40.0000 mg | SUBCUTANEOUS | Status: DC
  Administered 2020-05-31: 40 mg via SUBCUTANEOUS
  Filled 2020-05-31: qty 0.4

## 2020-05-31 MED ORDER — LOSARTAN POTASSIUM 50 MG PO TABS
50.0000 mg | ORAL_TABLET | Freq: Every day | ORAL | Status: DC
  Administered 2020-05-31 – 2020-06-01 (×2): 50 mg via ORAL
  Filled 2020-05-31 (×2): qty 1

## 2020-05-31 MED ORDER — INSULIN ASPART 100 UNIT/ML ~~LOC~~ SOLN: 0.0000 [IU] | Freq: Every day | SUBCUTANEOUS | Status: DC

## 2020-05-31 MED ORDER — INSULIN REGULAR(HUMAN) IN NACL 100-0.9 UT/100ML-% IV SOLN
INTRAVENOUS | Status: DC
  Administered 2020-05-31: 11.5 [IU]/h via INTRAVENOUS
  Filled 2020-05-31: qty 100

## 2020-05-31 MED ORDER — HYDROCHLOROTHIAZIDE 25 MG PO TABS
25.0000 mg | ORAL_TABLET | Freq: Every day | ORAL | Status: DC
  Administered 2020-05-31 – 2020-06-01 (×2): 25 mg via ORAL
  Filled 2020-05-31 (×2): qty 1

## 2020-05-31 MED ORDER — LACTATED RINGERS IV SOLN: INTRAVENOUS | Status: DC

## 2020-05-31 MED ORDER — ATORVASTATIN CALCIUM 20 MG PO TABS
40.0000 mg | ORAL_TABLET | Freq: Every day | ORAL | Status: DC
  Administered 2020-05-31 – 2020-06-01 (×2): 40 mg via ORAL
  Filled 2020-05-31 (×2): qty 2

## 2020-05-31 MED ORDER — DEXTROSE IN LACTATED RINGERS 5 % IV SOLN: INTRAVENOUS | Status: DC

## 2020-05-31 MED ORDER — LACTATED RINGERS IV BOLUS
1000.0000 mL | INTRAVENOUS | Status: AC
  Administered 2020-05-31: 1000 mL via INTRAVENOUS

## 2020-05-31 MED ORDER — TRAMADOL HCL 50 MG PO TABS: 50.0000 mg | ORAL_TABLET | Freq: Two times a day (BID) | ORAL | Status: DC | PRN

## 2020-05-31 MED ORDER — DEXTROSE 50 % IV SOLN: 0.0000 mL | INTRAVENOUS | Status: DC | PRN

## 2020-05-31 MED ORDER — INSULIN PUMP
Freq: Three times a day (TID) | SUBCUTANEOUS | Status: DC
  Filled 2020-05-31: qty 1

## 2020-05-31 MED ORDER — LACTATED RINGERS IV BOLUS
1000.0000 mL | Freq: Once | INTRAVENOUS | Status: AC
  Administered 2020-05-31: 1000 mL via INTRAVENOUS

## 2020-05-31 MED ORDER — ASPIRIN EC 81 MG PO TBEC
81.0000 mg | DELAYED_RELEASE_TABLET | Freq: Every day | ORAL | Status: DC
  Administered 2020-05-31 – 2020-06-01 (×2): 81 mg via ORAL
  Filled 2020-05-31 (×2): qty 1

## 2020-05-31 MED ORDER — VITAMIN D (ERGOCALCIFEROL) 1.25 MG (50000 UNIT) PO CAPS
50000.0000 [IU] | ORAL_CAPSULE | ORAL | Status: DC
  Administered 2020-05-31: 17:00:00 50000 [IU] via ORAL
  Filled 2020-05-31: qty 1

## 2020-05-31 MED ORDER — PAROXETINE HCL 20 MG PO TABS
40.0000 mg | ORAL_TABLET | Freq: Every day | ORAL | Status: DC
  Administered 2020-05-31 – 2020-06-01 (×2): 40 mg via ORAL
  Filled 2020-05-31 (×2): qty 2

## 2020-05-31 MED ORDER — INSULIN ASPART 100 UNIT/ML ~~LOC~~ SOLN: 0.0000 [IU] | Freq: Three times a day (TID) | SUBCUTANEOUS | Status: DC

## 2020-05-31 MED ORDER — POTASSIUM CHLORIDE 10 MEQ/100ML IV SOLN
10.0000 meq | INTRAVENOUS | Status: AC
  Administered 2020-05-31: 10 meq via INTRAVENOUS
  Filled 2020-05-31: qty 100

## 2020-05-31 NOTE — Progress Notes (Signed)
PHARMACIST - PHYSICIAN COMMUNICATION  CONCERNING:  Enoxaparin (Lovenox) for DVT Prophylaxis    RECOMMENDATION: Patient was prescribed enoxaprin 40mg  q24 hours for VTE prophylaxis.   Filed Weights   05/31/20 0152  Weight: 99.8 kg (220 lb)    Body mass index is 35.51 kg/m.  Estimated Creatinine Clearance: 26.5 mL/min (A) (by C-G formula based on SCr of 2.49 mg/dL (H)).   Based on [place] patient is candidate for enoxaparin 0.5mg /kg TBW SQ every 24 hours based on BMI being >30.  Patient is candidate for enoxaparin 30mg  every 24 hours based on CrCl <6ml/min or Weight <45kg  DESCRIPTION: Pharmacy has adjusted enoxaparin dose per [facility] policy.  With patient SCr = 26.5 & BMI = 35.5 now receiving enoxaparin 40 mg every 24 hours   Renda Rolls, PharmD, [facility] 05/31/2020 4:14 AM

## 2020-05-31 NOTE — Progress Notes (Signed)
Dr Waldron Labs and Tama Headings RN aware that BS 403, pt applying her pump and should given bolus

## 2020-05-31 NOTE — Progress Notes (Signed)
PROGRESS NOTE                                                                             PROGRESS NOTE                                                                                                                                                                                                             Patient Demographics:    Jocelyn Wells, is a 65 y.o. female, DOB - [birth year], KGY:[id number]  Outpatient Primary MD for the patient is [facility]ism[place], Vickki Muff, PA-C    LOS - 0  Admit date - 05/31/2020    Chief Complaint  Patient presents with  . Hyperglycemia       Brief Narrative   This is a no charge note as patient was seen and admitted earlier today by Dr. Damita Dunnings, patient was seen and examined, lab, imaging and chart was reviewed  65 year old female with type 2 diabetes on insulin pump followed by endocrinology, HTN, COPD, depression, stage IV CKD, history of CVA and chronic systolic heart failure, who presents to the emergency room with generalized malaise and elevated blood sugar readings greater than 600.    Subjective:    [name] today reports she is feeling better, no nausea, no vomiting, no abdominal pain.     Assessment  & Plan :    Principal Problem:   Type 2 diabetes mellitus with hyperosmolar hyperglycemic state (HHS) ([place]) Active Problems:   Essential hypertension   Chronic systolic heart failure (HCC)   COPD (chronic obstructive pulmonary disease) (HCC)   Depression   CKD (chronic kidney disease), stage IV (HCC)   History of CVA (cerebrovascular accident)   Hyperglycemia    Type 2 diabetes mellitus with hyperosmolar hyperglycemic state - Patient presents with malaise with blood sugar over 600 -  Was treated with insulin infusion, normal bicarb, no anion gap . -He is off insulin drip, discussed with patient, was recently started on an insulin pump, bradycardia knitter was consulted, she spoke with the patient  regarding  applying her insulin pump, has been on insulin pump for last 2 weeks, this is the first time she did apply to her legs, . -We will discontinue her NovoLog sliding scale, and resume her with insulin pump, will continue to monitor her CBG to be sure they are well controlled. -Continue with IV fluids     Essential hypertension - Continue home meds    Chronic systolic heart failure (HCC)   COPD (chronic obstructive pulmonary disease) ([place]) - Not acutely exacerbated - Continue home meds    Depression -continue home meds    CKD (chronic kidney disease), stage IV (HCC) - Creatinine 2.49, slightly above baseline of 2.0 in January 2022 - Continue to monitor    History of CVA (cerebrovascular accident) -continue antiplatelets and statins pending med rec     SpO2: 92 % O2 Flow Rate (L/min): 2 L/min  Recent Labs  Lab 05/31/20 0219 05/31/20 0337 05/31/20 0532 05/31/20 1046  WBC 11.5*  --   --   --   PLT 406*  --   --   --   AST 21  --   --   --   ALT 18  --   --   --   ALKPHOS 71  --   --   --   BILITOT 0.6  --   --   --   ALBUMIN 3.7  --   --   --   LATICACIDVEN  --   --  1.8 1.4  SARSCOV2NAA  --  NEGATIVE  --   --        ABG     Component Value Date/Time   PHART 7.382 03/25/2015 0907   PCO2ART 31.7 (L) 03/25/2015 0907   PO2ART 103.0 (H) 03/25/2015 0907   HCO3 28.1 (H) 05/31/2020 0219   TCO2 20 03/25/2015 0907   ACIDBASEDEF 5.0 (H) 03/25/2015 0907   O2SAT 68.2 05/31/2020 0219          Condition - Extremely Guarded  Family Communication  :  D/W patient  Code Status :  Full  Consults  :  None   Disposition Plan  :    Status is: Observation  The patient remains OBS appropriate and will d/c before 2 midnights.  Dispo: The patient is from: Home              Anticipated d/c is to: Home              Patient currently is not medically stable to d/c.   Difficult to place patient No      DVT Prophylaxis  :  Lovenox   Lab Results  Component  Value Date   PLT 406 (H) 05/31/2020    Diet :  Diet Order            Diet Carb Modified Fluid consistency: Thin; Room service appropriate? Yes  Diet effective now                  Inpatient Medications  Scheduled Meds: . enoxaparin (LOVENOX) injection  40 mg Subcutaneous Q24H  . insulin pump   Subcutaneous TID WC, HS, 0200   Continuous Infusions: . lactated ringers 125 mL/hr at 05/31/20 0815   PRN Meds:.  Antibiotics  :    Anti-infectives (From admission, onward)   None       Phillips Climes M.D on 05/31/2020 at 2:35 PM  To page go to [email or web address]   [facility] -  Office  [phone]  Objective:   Vitals:   05/31/20 0800 05/31/20 0831 05/31/20 1041 05/31/20 1235  BP: 120/72 (!) 108/56 (!) 148/83 (!) 178/94  Pulse: 86 88 88 80  Resp: 15 14 20 20   Temp:   98.1 F (36.7 C) 98.2 F (36.8 C)  TempSrc:   Oral Oral  SpO2: 96% 96% 92% 92%  Weight:      Height:        Wt Readings from Last 3 Encounters:  05/31/20 99.8 kg  04/01/20 99.8 kg  12/06/19 98 kg     Intake/Output Summary (Last 24 hours) at 05/31/2020 1435 Last data filed at 05/31/2020 0816 Gross per 24 hour  Intake 2434.17 ml  Output --  Net 2434.17 ml     Physical Exam  Awake Alert, No new F.N deficits, Normal affect Symmetrical Chest wall movement, Good air movement bilaterally, CTAB RRR,No Gallops,Rubs or new Murmurs, No Parasternal Heave +ve B.Sounds, Abd Soft, No tenderness, No Cyanosis, Clubbing or edema, No new Rash or bruise      Data Review:    CBC Recent Labs  Lab 05/31/20 0219  WBC 11.5*  HGB 13.8  HCT 43.1  PLT 406*  MCV 88.7  MCH 28.4  MCHC 32.0  RDW 15.1  LYMPHSABS 2.0  MONOABS 0.7  EOSABS 0.3  BASOSABS 0.1    Recent Labs  Lab 05/31/20 0219 05/31/20 0532 05/31/20 0714 05/31/20 1046  NA 127*  --  133* 132*  K 4.3  --  4.0 4.5  CL 88*  --  97* 94*  CO2 24  --  25 26  GLUCOSE 778*  --  113* 383*  BUN 44*  --  40* 40*  CREATININE  2.49*  --  2.02* 1.88*  CALCIUM 9.2  --  9.2 9.0  AST 21  --   --   --   ALT 18  --   --   --   ALKPHOS 71  --   --   --   BILITOT 0.6  --   --   --   ALBUMIN 3.7  --   --   --   LATICACIDVEN  --  1.8  --  1.4    ------------------------------------------------------------------------------------------------------------------ No results for input(s): CHOL, HDL, LDLCALC, TRIG, CHOLHDL, LDLDIRECT in the last 72 hours.  Lab Results  Component Value Date   HGBA1C 11.4 03/28/2019   ------------------------------------------------------------------------------------------------------------------ No results for input(s): TSH, T4TOTAL, T3FREE, THYROIDAB in the last 72 hours.  Invalid input(s): FREET3  Cardiac Enzymes No results for input(s): CKMB, TROPONINI, MYOGLOBIN in the last 168 hours.  Invalid input(s): CK ------------------------------------------------------------------------------------------------------------------    Component Value Date/Time   BNP 177.0 (H) 06/02/2015 0450    Micro Results Recent Results (from the past 240 hour(s))  Resp Panel by RT-PCR (Flu A&B, Covid) Nasopharyngeal Swab     Status: None   Collection Time: 05/31/20  3:37 AM   Specimen: Nasopharyngeal Swab; Nasopharyngeal(NP) swabs in vial transport medium  Result Value Ref Range Status   SARS Coronavirus 2 by RT PCR NEGATIVE NEGATIVE Final    Comment: (NOTE) SARS-CoV-2 target nucleic acids are NOT DETECTED.  The SARS-CoV-2 RNA is generally detectable in upper respiratory specimens during the acute phase of infection. The lowest concentration of SARS-CoV-2 viral copies this assay can detect is 138 copies/mL. A negative result does not preclude SARS-Cov-2 infection and should not be used as the sole basis for treatment or other patient management decisions. A negative result may occur with  improper specimen  collection/handling, submission of specimen other than nasopharyngeal swab, presence of  viral mutation(s) within the areas targeted by this assay, and inadequate number of viral copies(<138 copies/mL). A negative result must be combined with clinical observations, patient history, and epidemiological information. The expected result is Negative.  Fact Sheet for Patients:  EntrepreneurPulse.com.au  Fact Sheet for Healthcare Providers:  IncredibleEmployment.be  This test is no t yet approved or cleared by the [place] FDA and  has been authorized for detection and/or diagnosis of SARS-CoV-2 by FDA under an Emergency Use Authorization (EUA). This EUA will remain  in effect (meaning this test can be used) for the duration of the COVID-19 declaration under Section 564(b)(1) of the Act, 21 U.S.C.section 360bbb-3(b)(1), unless the authorization is terminated  or revoked sooner.       Influenza A by PCR NEGATIVE NEGATIVE Final   Influenza B by PCR NEGATIVE NEGATIVE Final    Comment: (NOTE) The Xpert Xpress SARS-CoV-2/FLU/RSV plus assay is intended as an aid in the diagnosis of influenza from Nasopharyngeal swab specimens and should not be used as a sole basis for treatment. Nasal washings and aspirates are unacceptable for Xpert Xpress SARS-CoV-2/FLU/RSV testing.  Fact Sheet for Patients: EntrepreneurPulse.com.au  Fact Sheet for Healthcare Providers: IncredibleEmployment.be  This test is not yet approved or cleared by the [place] FDA and has been authorized for detection and/or diagnosis of SARS-CoV-2 by FDA under an Emergency Use Authorization (EUA). This EUA will remain in effect (meaning this test can be used) for the duration of the COVID-19 declaration under Section 564(b)(1) of the Act, 21 U.S.C. section 360bbb-3(b)(1), unless the authorization is terminated or revoked.  Performed at [facility], [address], [place],  [address]     Radiology Reports DG  Chest Portable 1 View  Result Date: 05/31/2020 CLINICAL DATA:  Hypoxemia. High blood sugar. History of COPD. Nausea and vomiting. EXAM: PORTABLE CHEST 1 VIEW COMPARISON:  04/19/2017 FINDINGS: Shallow inspiration. Heart size and pulmonary vascularity are normal for inspiratory effort. No airspace disease or consolidation in the lungs. No pleural effusions. No pneumothorax. Mediastinal contours appear intact. A loop recorder is present. Calcified and tortuous aorta. IMPRESSION: No active disease. Electronically Signed   By: Lucienne Capers M.D.   On: 05/31/2020 03:17

## 2020-05-31 NOTE — H&P (Signed)
History and Physical    [name] YYT:[id number] DOB: 20-Aug-1955 DOA: 05/31/2020  PCP: Margo Common, PA-C   Patient coming from: Home  I have personally briefly reviewed patient's old medical records in [place]  Chief Complaint: Malaise, elevated blood sugar over 600  HPI: Jocelyn Wells is a 65 y.o. female with medical history significant for Type 2 diabetes on insulin pump for past 2 weeks followed by endocrinology, HTN, COPD, depression, stage IV CKD, history of CVA and chronic systolic heart failure, who presents to the emergency room with generalized malaise and elevated blood sugar readings greater than 600.  She has associated nausea and vomited x1.  She denies cough, fever or chills, denies chest pain or shortness of breath, abdominal pain, change in bowel habit or dysuria.  Denies sore throat runny nose or wheezing. ED course: On arrival BP 159/93, pulse 94, respirations 22 with O2 sat 93% on room air blood work significant for blood sugar of 778 with normal anion gap of 15.  Creatinine 2.49, ALT of baseline of 2.0 in January 2022.  Blood work otherwise unremarkable except for mild elevation in WBC at 11,500 : EKG, reviewed and interpreted myself: Normal sinus rhythm at 88 with right bundle branch block and no acute ST-T wave changes Imaging: Chest x-ray no active disease  Patient given IV fluid boluses and started on insulin infusion per Endo tool.  Hospitalist consulted for admission.  Review of Systems: As per HPI otherwise all other systems on review of systems negative.    Past Medical History:  Diagnosis Date  . Anxiety   . Cardiomyopathy [facility])    new to her Jan 2017  . COPD (chronic obstructive pulmonary disease) ([place])   . Depression   . Diabetes mellitus, type II ([place])   . HTN (hypertension)   . Obesity   . Osteoporosis   . PONV (postoperative nausea and vomiting)   . Stroke [facility])    Jan 2017    Past Surgical History:  Procedure Laterality  Date  . ABDOMINAL HYSTERECTOMY    . ANKLE FRACTURE SURGERY Left 2002  . BILATERAL SALPINGOOPHORECTOMY  2000  . CARDIAC CATHETERIZATION N/A 03/25/2015   Procedure: Right/Left Heart Cath and Coronary Angiography;  Surgeon: Belva Crome, MD;  Location: [place] [facility] LAB;  Service: Cardiovascular;  Laterality: N/A;  . [place]  . COLONOSCOPY WITH PROPOFOL N/A 09/22/2016   Procedure: COLONOSCOPY WITH PROPOFOL;  Surgeon: Manya Silvas, MD;  Location: [facility] ENDOSCOPY;  Service: Endoscopy;  Laterality: N/A;  . EP IMPLANTABLE DEVICE N/A 08/05/2015   Procedure: Loop Recorder Insertion;  Surgeon: Deboraha Sprang, MD;  Location: [place] [facility];  Service: Cardiovascular;  Laterality: N/A;  . ESOPHAGOGASTRODUODENOSCOPY (EGD) WITH PROPOFOL N/A 09/22/2016   Procedure: ESOPHAGOGASTRODUODENOSCOPY (EGD) WITH PROPOFOL;  Surgeon: Manya Silvas, MD;  Location: [facility] ENDOSCOPY;  Service: Endoscopy;  Laterality: N/A;  . ESOPHAGOGASTRODUODENOSCOPY (EGD) WITH PROPOFOL N/A 12/08/2016   Procedure: ESOPHAGOGASTRODUODENOSCOPY (EGD) WITH PROPOFOL;  Surgeon: Manya Silvas, MD;  Location: [facility] ENDOSCOPY;  Service: Endoscopy;  Laterality: N/A;  . KNEE ARTHROSCOPY Left 2005  . TEE WITHOUT CARDIOVERSION N/A 01/31/2015   Procedure: TRANSESOPHAGEAL ECHOCARDIOGRAM (TEE);  Surgeon: Lelon Perla, MD;  Location: [facility];  Service: Cardiovascular;  Laterality: N/A;  . [place]  . ULNAR NERVE TRANSPOSITION  2008     reports that she has been smoking cigarettes. She started smoking about 46 years ago. She has a  36.00 pack-year smoking history. She has never used smokeless tobacco. She reports that she does not drink alcohol and does not use drugs.  Allergies  Allergen Reactions  . Codeine Nausea And Vomiting    Family History  Problem Relation Age of Onset  . Heart disease Mother        died from CHF  . Asthma Mother   . Diabetes Mother   . Heart disease Father   . Aneurysm  Father   . COPD Brother   . Diabetes Brother   . Alcohol abuse Paternal Aunt   . Anemia Neg Hx   . Arrhythmia Neg Hx   . Clotting disorder Neg Hx   . Fainting Neg Hx   . Heart attack Neg Hx   . Heart failure Neg Hx   . Hyperlipidemia Neg Hx   . Hypertension Neg Hx       Prior to Admission medications   Medication Sig Start Date End Date Taking? Authorizing Provider  apixaban (ELIQUIS) 2.5 MG TABS tablet Take 1 tablet (2.5 mg total) by mouth 2 (two) times daily. 05/10/19   Deboraha Sprang, MD  atorvastatin (LIPITOR) 40 MG tablet TAKE 1 TABLET BY MOUTH EVERY DAY 05/09/20   Chrismon, Vickki Muff, PA-C  B-D INS SYRINGE 0.5CC/31GX5/16 31G X 5/16" 0.5 ML MISC  02/23/16   [provider]  denosumab (PROLIA) 60 MG/ML SOSY injection Inject 60 mg into the skin every 6 (six) months.    [provider]  Dulaglutide (TRULICITY) 1.5 OY/7.7AJ SOPN Inject into the skin.    [provider]  gabapentin (NEURONTIN) 300 MG capsule Take 1 capsule (300 mg total) by mouth daily. Patient taking differently: Take 300 mg by mouth 2 (two) times daily.  10/23/18 10/23/19  Rainey Pines, MD  glucose blood test strip OneTouch Verio strips    [provider]  glyBURIDE-metformin (GLUCOVANCE) 5-500 MG per tablet Take 2 tablets by mouth 2 (two) times daily.     [provider]  hydrochlorothiazide (HYDRODIURIL) 25 MG tablet Take 25 mg by mouth daily.    [provider]  insulin aspart (NOVOLOG) 100 UNIT/ML FlexPen Inject into the skin 3 (three) times daily with meals.    [provider]  insulin glargine (LANTUS) 100 UNIT/ML injection Inject 40 Units into the skin daily.    [provider]  loratadine (CLARITIN) 10 MG tablet Take 1 tablet by mouth daily.    [provider]  losartan (COZAAR) 25 MG tablet TAKE 2 TABLETS BY MOUTH EVERY DAY 05/05/20   Chrismon, Vickki Muff, PA-C  mupirocin ointment (BACTROBAN) 2 % mupirocin 2 % topical ointment     [provider]  PARoxetine (PAXIL) 40 MG tablet Take 1 tablet (40 mg total) by mouth daily. 05/09/20   [place] Crane, MD  traMADol (ULTRAM) 50 MG tablet Take 1 tablet (50 mg total) by mouth 2 (two) times daily as needed for severe pain. Month last 30 days. 05/09/20 08/07/20  Gillis Santa, MD  Vitamin D, Ergocalciferol, (DRISDOL) 1.25 MG (50000 UT) CAPS capsule Take 50,000 Units by mouth once a week. 02/21/18   [provider]    Physical Exam: Vitals:   05/31/20 0151 05/31/20 0152 05/31/20 0355  BP: (!) 159/93  (!) 153/74  Pulse: 94  85  Resp: (!) 22  (!) 22  Temp: 98 F (36.7 C)    TempSrc: Oral    SpO2: 93%  95%  Weight:  99.8 kg   Height:  5\' 6"  (1.676 m)      Vitals:   05/31/20 0151 05/31/20 0152 05/31/20 0355  BP: (!) 159/93  (!) 153/74  Pulse: 94  85  Resp: (!) 22  (!) 22  Temp: 98 F (36.7 C)    TempSrc: Oral    SpO2: 93%  95%  Weight:  99.8 kg   Height:  5\' 6"  (1.676 m)       Constitutional: Alert and oriented x 3 . Not in any apparent distress HEENT:      Head: Normocephalic and atraumatic.         Eyes: PERLA, EOMI, Conjunctivae are normal. Sclera is non-icteric.       Mouth/Throat: Mucous membranes are moist.       Neck: Supple with no signs of meningismus. Cardiovascular: Regular rate and rhythm. No murmurs, gallops, or rubs. 2+ symmetrical distal pulses are present . No JVD. No LE edema Respiratory: Respiratory effort normal .Lungs sounds clear bilaterally. No wheezes, crackles, or rhonchi.  Gastrointestinal: Soft, non tender, and non distended with positive bowel sounds.  Genitourinary: No CVA tenderness. Musculoskeletal: Nontender with normal range of motion in all extremities. No cyanosis, or erythema of extremities. Neurologic:  Face is symmetric. Moving all extremities. No gross focal neurologic deficits . Skin: Skin is warm, dry.  No rash or ulcers Psychiatric: Mood and affect are normal    Labs on Admission: I have personally  reviewed following labs and imaging studies  CBC: Recent Labs  Lab 05/31/20 0219  WBC 11.5*  NEUTROABS 8.4*  HGB 13.8  HCT 43.1  MCV 88.7  PLT 850*   Basic Metabolic Panel: Recent Labs  Lab 05/31/20 0219  NA 127*  K 4.3  CL 88*  CO2 24  GLUCOSE 778*  BUN 44*  CREATININE 2.49*  CALCIUM 9.2   GFR: Estimated Creatinine Clearance: 26.5 mL/min (A) (by C-G formula based on SCr of 2.49 mg/dL (H)). Liver Function Tests: Recent Labs  Lab 05/31/20 0219  AST 21  ALT 18  ALKPHOS 71  BILITOT 0.6  PROT 7.1  ALBUMIN 3.7   No results for input(s): LIPASE, AMYLASE in the last 168 hours. No results for input(s): AMMONIA in the last 168 hours. Coagulation Profile: No results for input(s): INR, PROTIME in the last 168 hours. Cardiac Enzymes: No results for input(s): CKTOTAL, CKMB, CKMBINDEX, TROPONINI in the last 168 hours. BNP (last 3 results) No results for input(s): PROBNP in the last 8760 hours. HbA1C: No results for input(s): HGBA1C in the last 72 hours. CBG: Recent Labs  Lab 05/31/20 0154  GLUCAP >600*   Lipid Profile: No results for input(s): CHOL, HDL, LDLCALC, TRIG, CHOLHDL, LDLDIRECT in the last 72 hours. Thyroid Function Tests: No results for input(s): TSH, T4TOTAL, FREET4, T3FREE, THYROIDAB in the last 72 hours. Anemia Panel: No results for input(s): VITAMINB12, FOLATE, FERRITIN, TIBC, IRON, RETICCTPCT in the last 72 hours. Urine analysis:    Component Value Date/Time   COLORURINE YELLOW (A) 10/06/2017 0425   APPEARANCEUR HAZY (A) 10/06/2017 0425   LABSPEC 1.014 10/06/2017 0425   PHURINE 5.0 10/06/2017 0425   GLUCOSEU >=500 (A) 10/06/2017 0425   HGBUR NEGATIVE 10/06/2017 0425   BILIRUBINUR NEGATIVE 10/06/2017 0425   KETONESUR NEGATIVE 10/06/2017 0425   PROTEINUR 30 (A) 10/06/2017 0425   NITRITE NEGATIVE 10/06/2017 0425   LEUKOCYTESUR TRACE (A) 10/06/2017 0425    Radiological Exams on Admission: DG Chest Portable 1 View  Result Date:  05/31/2020 CLINICAL DATA:  Hypoxemia. High blood sugar.  History of COPD. Nausea and vomiting. EXAM: PORTABLE CHEST 1 VIEW COMPARISON:  04/19/2017 FINDINGS: Shallow inspiration. Heart size and pulmonary vascularity are normal for inspiratory effort. No airspace disease or consolidation in the lungs. No pleural effusions. No pneumothorax. Mediastinal contours appear intact. A loop recorder is present. Calcified and tortuous aorta. IMPRESSION: No active disease. Electronically Signed   By: Lucienne Capers M.D.   On: 05/31/2020 03:17     Assessment/Plan 65 year old female with type 2 diabetes on insulin pump followed by endocrinology, HTN, COPD, depression, stage IV CKD, history of CVA and chronic systolic heart failure, who presents to the emergency room with generalized malaise and elevated blood sugar readings greater than 600.     Type 2 diabetes mellitus with hyperosmolar hyperglycemic state - Patient presents with malaise with blood sugar over 600 - Continue insulin infusion to transition to insulin pump when appropriate - Patient states blood sugars became uncontrolled after she changed the cartridge.  Patient new to insulin pump so might need some assistance when transitioned to pump - Continue IV fluids and potassium repletion per Endo tool    Essential hypertension - Continue home meds    Chronic systolic heart failure (HCC)   COPD (chronic obstructive pulmonary disease) ([place]) - Not acutely exacerbated - Continue home meds    Depression -continue home meds    CKD (chronic kidney disease), stage IV (H[facility]) - Creatinine 2.49, slightly above baseline of 2.0 in January 2022 - Continue to monitor    History of CVA (cerebrovascular accident) -continue antiplatelets and statins pending med rec    DVT prophylaxis: Lovenox  Code Status: full code  Family Communication:  none  Disposition Plan: Back to previous home environment Consults called: none  Status: Observation    Athena Masse MD [facility]     05/31/2020, 4:14 AM

## 2020-05-31 NOTE — ED Notes (Signed)
D5LR stopped and LR infusion initiated by this RN per Admitting MD. Pt repositioned in bed, lights dimmed for comfort. Pt denies further needs. Call bell remains within reach of patient at this time.

## 2020-05-31 NOTE — ED Notes (Signed)
Lab called with abnomal BMP results with very high potassium. Plan to hold second administration until second specimen results.

## 2020-05-31 NOTE — ED Provider Notes (Signed)
[facility] Emergency Department Provider Note  ____________________________________________   Event Date/Time   First MD Initiated Contact with Patient 05/31/20 [phone]     (approximate)  I have reviewed the triage vital signs and the nursing notes.   HISTORY  Chief Complaint Hyperglycemia    HPI Jocelyn Wells is a 65 y.o. female with medical history as listed below which notably includes type 2 diabetes, degree of cardiomyopathy, and some COPD for which she does not use oxygen.  She presents for evaluation of "not feeling well" and high blood glucose.  She got an insulin pump a few weeks ago and does not think it is working adequately.  Her blood sugars have been steadily trending up.  She has been feeling worse over the last few days including feeling tired, nauseated with at least one episode of vomiting, generally weak, increased urinary frequency, and increased thirst.  Nothing particular makes her better or worse.  Tonight her blood glucose measurement was greater than 600 so she came to the ED.  She denies fever/chills, sore throat, chest pain, shortness of breath.  She said that even though she has COPD she does not use breathing treatments at home and she has not felt short of breath.  She describes her symptoms overall as severe.  She denies any pain at this time.         Past Medical History:  Diagnosis Date  . Anxiety   . Cardiomyopathy [facility])    new to her Jan 2017  . COPD (chronic obstructive pulmonary disease) ([place])   . Depression   . Diabetes mellitus, type II ([place])   . HTN (hypertension)   . Obesity   . Osteoporosis   . PONV (postoperative nausea and vomiting)   . Stroke [facility])    Jan 2017    Patient Active Problem List   Diagnosis Date Noted  . Type 2 diabetes mellitus with hyperosmolar hyperglycemic state (HHS) ([place]) 05/31/2020  . CKD (chronic kidney disease), stage IV ([place]) 05/31/2020  . History of CVA (cerebrovascular  accident) 05/31/2020  . MDD (major depressive disorder), recurrent, in full remission ([place]) 04/09/2019  . Compression fracture of L1 lumbar vertebra ([place]) 01/30/2019  . Compression fracture of body of thoracic vertebra ([place]) 01/30/2019  . Lumbar foraminal stenosis (RIGHT L1) 01/30/2019  . Spinal stenosis of lumbar region with neurogenic claudication (L4-L5) 01/30/2019  . DVT (deep venous thrombosis) ([place]) 02/09/2018  . CVA (cerebral vascular accident) ([place]) 10/05/2017  . Depression 06/12/2015  . Cerebrovascular accident (CVA) due to bilateral thrombosis of carotid arteries ([place]) 06/12/2015  . Excessive daytime sleepiness 06/12/2015  . Nicotine dependence 04/24/2015  . Snoring 04/24/2015  . Sleep paralysis, recurrent isolated 04/24/2015  . Hypersomnia with sleep apnea 04/24/2015  . Cataplexy 04/24/2015  . Morbid obesity due to excess calories ([place]) 04/24/2015  . COPD (chronic obstructive pulmonary disease) ([place]) 04/24/2015  . Cerebrovascular accident (CVA) due to thrombosis of left middle cerebral artery ([place]) 04/24/2015  . Embolic stroke ([place]) 54/65/6812  . Fatigue 04/23/2015  . Type 2 diabetes mellitus ([place]) 04/07/2015  . Chronic systolic heart failure ([place]) 03/25/2015  . Stroke ([place]) 01/31/2015  . Nausea & vomiting 01/29/2015  . AKI (acute kidney injury) ([place]) 01/29/2015  . CVA (cerebral infarction) 01/29/2015  . Stroke (cerebrum) ([place]) 01/29/2015  . Dizziness and giddiness 07/05/2014  . Essential hypertension 07/05/2014  . Carbuncle and furuncle 07/05/2014  . BP (high blood pressure) 07/05/2014  . Does not feel right 07/05/2014  .  Pain of perianal area 07/05/2014  . Guttate psoriasis 07/05/2014  . Gonalgia 07/05/2014  . H/O diabetes mellitus 06/05/2014  . CAFL (chronic airflow limitation) ([place]) 06/05/2014  . H/O visual disturbance 06/05/2014  . H/O elevated lipids 06/05/2014  . H/O: HTN (hypertension) 06/05/2014  . H/O: osteoarthritis 06/05/2014  . Initial insomnia  06/05/2014  . Anxiety, generalized 06/05/2014  . Depression, major, recurrent, moderate ([place]) 06/05/2014  . Microalbuminuria 12/23/2013  . Long term current use of insulin ([place]) 12/23/2013  . Compulsive tobacco user syndrome 12/23/2013  . Type 2 diabetes mellitus with other diabetic kidney complication ([place]) 34/74/2595  . Type II diabetes mellitus with renal manifestations, uncontrolled ([place]) 12/23/2013  . Contusion of cheek 03/31/2009  . Change in blood platelet count 06/08/2007  . Blood in feces 06/01/2007  . D (diarrhea) 06/01/2007  . Disturbance of skin sensation 09/02/2006  . Difficulty hearing 07/25/2006  . Acute onset aura migraine 06/27/2006  . Cephalalgia 05/27/2006  . Clinical depression 10/22/2005  . Diabetes mellitus type 2, uncontrolled ([place]) 10/22/2005  . HLD (hyperlipidemia) 10/22/2005  . Arthritis, degenerative 10/22/2005  . Current tobacco use 10/22/2005    Past Surgical History:  Procedure Laterality Date  . ABDOMINAL HYSTERECTOMY    . ANKLE FRACTURE SURGERY Left 2002  . BILATERAL SALPINGOOPHORECTOMY  2000  . CARDIAC CATHETERIZATION N/A 03/25/2015   Procedure: Right/Left Heart Cath and Coronary Angiography;  Surgeon: Belva Crome, MD;  Location: [place] CV LAB;  Service: Cardiovascular;  Laterality: N/A;  . [place]  . COLONOSCOPY WITH PROPOFOL N/A 09/22/2016   Procedure: COLONOSCOPY WITH PROPOFOL;  Surgeon: Manya Silvas, MD;  Location: [facility] ENDOSCOPY;  Service: Endoscopy;  Laterality: N/A;  . EP IMPLANTABLE DEVICE N/A 08/05/2015   Procedure: Loop Recorder Insertion;  Surgeon: Deboraha Sprang, MD;  Location: [place] CV LAB;  Service: Cardiovascular;  Laterality: N/A;  . ESOPHAGOGASTRODUODENOSCOPY (EGD) WITH PROPOFOL N/A 09/22/2016   Procedure: ESOPHAGOGASTRODUODENOSCOPY (EGD) WITH PROPOFOL;  Surgeon: Manya Silvas, MD;  Location: [facility] ENDOSCOPY;  Service: Endoscopy;  Laterality: N/A;  . ESOPHAGOGASTRODUODENOSCOPY (EGD) WITH PROPOFOL  N/A 12/08/2016   Procedure: ESOPHAGOGASTRODUODENOSCOPY (EGD) WITH PROPOFOL;  Surgeon: Manya Silvas, MD;  Location: [facility] ENDOSCOPY;  Service: Endoscopy;  Laterality: N/A;  . KNEE ARTHROSCOPY Left 2005  . TEE WITHOUT CARDIOVERSION N/A 01/31/2015   Procedure: TRANSESOPHAGEAL ECHOCARDIOGRAM (TEE);  Surgeon: Lelon Perla, MD;  Location: [place];  Service: Cardiovascular;  Laterality: N/A;  . [place]  . ULNAR NERVE TRANSPOSITION  2008    Prior to Admission medications   Medication Sig Start Date End Date Taking? Authorizing Provider  apixaban (ELIQUIS) 2.5 MG TABS tablet Take 1 tablet (2.5 mg total) by mouth 2 (two) times daily. 05/10/19   Deboraha Sprang, MD  atorvastatin (LIPITOR) 40 MG tablet TAKE 1 TABLET BY MOUTH EVERY DAY 05/09/20   Chrismon, Vickki Muff, PA-C  B-D INS SYRINGE 0.5CC/31GX5/16 31G X 5/16" 0.5 ML MISC  02/23/16   [provider]  denosumab (PROLIA) 60 MG/ML SOSY injection Inject 60 mg into the skin every 6 (six) months.    [provider]  Dulaglutide (TRULICITY) 1.5 GL/8.7FI SOPN Inject into the skin.    [provider]  gabapentin (NEURONTIN) 300 MG capsule Take 1 capsule (300 mg total) by mouth daily. Patient taking differently: Take 300 mg by mouth 2 (two) times daily.  10/23/18 10/23/19  Rainey Pines, MD  glucose blood test strip OneTouch Verio strips    [provider]  glyBURIDE-metformin (GLUCOVANCE) 5-500 MG per tablet Take 2 tablets by mouth 2 (two) times daily.     [provider]  hydrochlorothiazide (HYDRODIURIL) 25 MG tablet Take 25 mg by mouth daily.    [provider]  insulin aspart (NOVOLOG) 100 UNIT/ML FlexPen Inject into the skin 3 (three) times daily with meals.    [provider]  insulin glargine (LANTUS) 100 UNIT/ML injection Inject 40 Units into the skin daily.    [provider]  loratadine (CLARITIN) 10 MG tablet Take 1 tablet by mouth daily.    [provider]  losartan (COZAAR) 25 MG tablet TAKE 2 TABLETS BY MOUTH EVERY DAY 05/05/20   Chrismon, Vickki Muff, PA-C  mupirocin ointment (BACTROBAN) 2 % mupirocin 2 % topical ointment    [provider]  PARoxetine (PAXIL) 40 MG tablet Take 1 tablet (40 mg total) by mouth daily. 05/09/20   [place] Crane, MD  traMADol (ULTRAM) 50 MG tablet Take 1 tablet (50 mg total) by mouth 2 (two) times daily as needed for severe pain. Month last 30 days. 05/09/20 08/07/20  Gillis [place], MD  Vitamin D, Ergocalciferol, (DRISDOL) 1.25 MG (50000 UT) CAPS capsule Take 50,000 Units by mouth once a week. 02/21/18   [provider]    Allergies Codeine  Family History  Problem Relation Age of Onset  . Heart disease Mother        died from CHF  . Asthma Mother   . Diabetes Mother   . Heart disease Father   . Aneurysm Father   . COPD Brother   . Diabetes Brother   . Alcohol abuse Paternal Aunt   . Anemia Neg Hx   . Arrhythmia Neg Hx   . Clotting disorder Neg Hx   . Fainting Neg Hx   . Heart attack Neg Hx   . Heart failure Neg Hx   . Hyperlipidemia Neg Hx   . Hypertension Neg Hx     Social History Social History   Tobacco Use  . Smoking status: Current Every Day Smoker    Packs/day: 1.00    Years: 36.00    Pack years: 36.00    Types: Cigarettes    Start date: 02/08/1974  . Smokeless tobacco: Never Used  . Tobacco comment: "I can not quit smoking, I have tried". 1 PPD  Vaping Use  . Vaping Use: Never used  Substance Use Topics  . Alcohol use: No    Alcohol/week: 0.0 standard drinks  . Drug use: No    Review of Systems Constitutional: No fever/chills.  General malaise and fatigue.  Positive for polydipsia. Eyes: No visual changes. ENT: No sore throat. Cardiovascular: Denies chest pain. Respiratory: Denies shortness of breath. Gastrointestinal: Recent nausea and at least one episode of vomiting.  Denies abdominal pain. Genitourinary: Positive for polyuria.  Negative for  dysuria. Musculoskeletal: Negative for neck pain.  Negative for back pain. Integumentary: Negative for rash. Neurological: Negative for headaches, focal weakness or numbness.   ____________________________________________   PHYSICAL EXAM:  VITAL SIGNS: ED Triage Vitals  Enc Vitals Group     BP 05/31/20 0151 (!) 159/93     Pulse Rate 05/31/20 0151 94     Resp 05/31/20 0151 (!) 22     Temp 05/31/20 0151 98 F (36.7 C)     Temp Source 05/31/20 0151 Oral     SpO2 05/31/20 0151 93 %     Weight 05/31/20 0152 99.8 kg (220 lb)  Height 05/31/20 0152 1.676 m (5\' 6" )     Head Circumference --      Peak Flow --      Pain Score 05/31/20 0152 0     Pain Loc --      Pain Edu? --      Excl. in [place]? --     Constitutional: Alert and oriented.  Eyes: Conjunctivae are normal.  Head: Atraumatic. Nose: No congestion/rhinnorhea. Mouth/Throat: Patient is wearing a mask. Neck: No stridor.  No meningeal signs.   Cardiovascular: Normal rate, regular rhythm. Good peripheral circulation. Respiratory: Normal respiratory effort.  No retractions.  Lung sounds are clear to auscultation bilaterally with no wheezing, rales, or rhonchi. Gastrointestinal: Morbid obesity.  Soft and nontender. No distention.  Musculoskeletal: No lower extremity tenderness nor edema. No gross deformities of extremities. Neurologic:  Normal speech and language. No gross focal neurologic deficits are appreciated.  Skin:  Skin is warm, dry and intact. Psychiatric: Mood and affect are normal. Speech and behavior are normal.  ____________________________________________   LABS (all labs ordered are listed, but only abnormal results are displayed)  Labs Reviewed  COMPREHENSIVE METABOLIC PANEL - Abnormal; Notable for the following components:      Result Value   Sodium 127 (*)    Chloride 88 (*)    Glucose, Bld 778 (*)    BUN 44 (*)    Creatinine, Ser 2.49 (*)    GFR, Estimated 21 (*)    All other components within  normal limits  CBC WITH DIFFERENTIAL/PLATELET - Abnormal; Notable for the following components:   WBC 11.5 (*)    Platelets 406 (*)    Neutro Abs 8.4 (*)    All other components within normal limits  BLOOD GAS, VENOUS - Abnormal; Notable for the following components:   Bicarbonate 28.1 (*)    All other components within normal limits  CBG MONITORING, ED - Abnormal; Notable for the following components:   Glucose-Capillary >600 (*)    All other components within normal limits  RESP PANEL BY RT-PCR (FLU A&B, COVID) ARPGX2  BETA-HYDROXYBUTYRIC ACID  URINALYSIS, ROUTINE W REFLEX MICROSCOPIC  OSMOLALITY  LACTIC ACID, PLASMA  LACTIC ACID, PLASMA  HIV ANTIBODY (ROUTINE TESTING W REFLEX)  BASIC METABOLIC PANEL  BASIC METABOLIC PANEL  BASIC METABOLIC PANEL  BASIC METABOLIC PANEL  BASIC METABOLIC PANEL  HEMOGLOBIN A1C  CBG MONITORING, ED   ____________________________________________  EKG  ED ECG REPORT I, Hinda Kehr, the attending physician, personally viewed and interpreted this ECG.  Date: 05/31/2020 EKG Time: 2:39 Rate: 88 Rhythm: normal sinus rhythm QRS Axis: normal Intervals: Right bundle branch block and left anterior fascicular block with left ventricular hypertrophy ST/T Wave abnormalities: Non-specific ST segment / T-wave changes, but no clear evidence of acute ischemia. Narrative Interpretation: no definitive evidence of acute ischemia; does not meet STEMI criteria.   ____________________________________________  RADIOLOGY I, Hinda Kehr, personally viewed and evaluated these images (plain radiographs) as part of my medical decision making, as well as reviewing the written report by the radiologist.  ED MD interpretation: No acute abnormalities on chest x-ray  Official radiology report(s): DG Chest Portable 1 View  Result Date: 05/31/2020 CLINICAL DATA:  Hypoxemia. High blood sugar. History of COPD. Nausea and vomiting. EXAM: PORTABLE CHEST 1 VIEW COMPARISON:   04/19/2017 FINDINGS: Shallow inspiration. Heart size and pulmonary vascularity are normal for inspiratory effort. No airspace disease or consolidation in the lungs. No pleural effusions. No pneumothorax. Mediastinal contours appear intact. A loop recorder is  present. Calcified and tortuous aorta. IMPRESSION: No active disease. Electronically Signed   By: Lucienne Capers M.D.   On: 05/31/2020 03:17    ____________________________________________   PROCEDURES   Procedure(s) performed (including Critical Care):  .Critical Care Performed by: Hinda Kehr, MD Authorized by: Hinda Kehr, MD   Critical care provider statement:    Critical care time (minutes):  30   Critical care time was exclusive of:  Separately billable procedures and treating other patients   Critical care was necessary to treat or prevent imminent or life-threatening deterioration of the following conditions:  Metabolic crisis   Critical care was time spent personally by me on the following activities:  Development of treatment plan with patient or surrogate, discussions with consultants, evaluation of patient's response to treatment, examination of patient, obtaining history from patient or surrogate, ordering and performing treatments and interventions, ordering and review of laboratory studies, ordering and review of radiographic studies, pulse oximetry, re-evaluation of patient's condition and review of old charts .1-3 Lead EKG Interpretation Performed by: Hinda Kehr, MD Authorized by: Hinda Kehr, MD     Interpretation: normal     ECG rate:  88   ECG rate assessment: normal     Rhythm: sinus rhythm     Ectopy: none     Conduction: normal       ____________________________________________   INITIAL IMPRESSION / MDM / ASSESSMENT AND PLAN / ED COURSE  As part of my medical decision making, I reviewed the following data within the [place] notes reviewed and incorporated, Labs  reviewed , EKG interpreted , Old chart reviewed, Discussed with admitting physician  and Notes from prior ED visits   Differential diagnosis includes, but is not limited to, hyperglycemia, DKA, HHS, acute infection.  The patient is on the cardiac monitor to evaluate for evidence of arrhythmia and/or significant heart rate changes.  Of note, the patient has been slightly hypoxemic at 87 to 88%.  This could be due to her body habitus (pickwickian syndrome) or her COPD although her lungs are clear to auscultation and she is not in respiratory distress.  We put her on 2 L of oxygen and her oxygen saturation is staying in the upper 90s.  Chest x-ray is pending as are the rest of her labs but I anticipate that she will require admission for IV insulin for treatment of DKA versus HHS.  Her CBG in the emergency department was greater than 600.     Clinical Course as of 05/31/20 0408  Sat May 31, 2020  0310 Beta-Hydroxybutyric Acid: 0.08 [CF]  0341 DG Chest Portable 1 View I personally reviewed the patient's imaging and agree with the radiologist's interpretation that there is no evidence of acute abnormality on chest x-ray. [CF]  0407 Lab work is notable for hyperglycemia at 778 with acute on chronic kidney injury with creatinine of 2.49 and a BUN of 44.  CBC is essentially normal other than a mild leukocytosis of 11.5.  Osmolality is pending as is lactic acid but her VBG is within normal limits with no evidence of acidosis.  She is getting 2 L of lactated Ringer's and I started the Endo tool IV insulin HHS protocol.  I contacted the hospitalist, Dr. Damita Dunnings, who will admit.  The patient understands and agrees with the plan as well. [CF]    Clinical Course User Index [CF] Hinda Kehr, MD     ____________________________________________  FINAL CLINICAL IMPRESSION(S) / ED DIAGNOSES  Final diagnoses:  Hyperosmolar hyperglycemic state (HHS) ([place])  Acute kidney injury ([place])  Hypoxemia      MEDICATIONS GIVEN DURING THIS VISIT:  Medications  insulin regular, human (MYXREDLIN) 100 units/ 100 mL infusion (has no administration in time range)  lactated ringers infusion (has no administration in time range)  dextrose 5 % in lactated ringers infusion (has no administration in time range)  dextrose 50 % solution 0-50 mL (has no administration in time range)  potassium chloride 10 mEq in 100 mL IVPB (has no administration in time range)  enoxaparin (LOVENOX) injection 30 mg (has no administration in time range)  lactated ringers bolus 1,000 mL (0 mLs Intravenous Stopped 05/31/20 0341)  lactated ringers bolus 1,000 mL (1,000 mLs Intravenous New Bag/Given 05/31/20 0351)     ED Discharge Orders    None      *Please note:  Jocelyn Wells was evaluated in Emergency Department on 05/31/2020 for the symptoms described in the history of present illness. She was evaluated in the context of the global COVID-19 pandemic, which necessitated consideration that the patient might be at risk for infection with the SARS-CoV-2 virus that causes COVID-19. Institutional protocols and algorithms that pertain to the evaluation of patients at risk for COVID-19 are in a state of rapid change based on information released by regulatory bodies including the CDC and federal and state organizations. These policies and algorithms were followed during the patient's care in the ED.  Some ED evaluations and interventions may be delayed as a result of limited staffing during and after the pandemic.*  Note:  This document was prepared using Dragon voice recognition software and may include unintentional dictation errors.   Hinda Kehr, MD 05/31/20 [phone]

## 2020-05-31 NOTE — ED Notes (Signed)
Pt sitting up in bed eating breakfast at this time, provided with milk per her request.

## 2020-05-31 NOTE — Progress Notes (Signed)
Inpatient Diabetes Program Recommendations  AACE/ADA: New Consensus Statement on Inpatient Glycemic Control (2015)  Target Ranges:  Prepandial:   less than 140 mg/dL      Peak postprandial:   less than 180 mg/dL (1-2 hours)      Critically ill patients:  140 - 180 mg/dL   Lab Results  Component Value Date   GLUCAP 115 (H) 05/31/2020   HGBA1C 11.4 03/28/2019    Review of Glycemic Control  Diabetes history: DM type 2, Sees Dr. Gabriel Carina last visit on 14th Outpatient Diabetes medications: Omnipod insulin pump with Novolog  Current orders for Inpatient glycemic control: Novolog 0-20 units + hs  778 on presentation  Inpatient Diabetes Program Recommendations:    Spoke with pt over the phone regarding applying her insulin pump and inquiring about her knowledge of how to place pod on skin. It was the first time pt had placed insulin pump on her leg. She has been on the insulin pump for 2 weeks. She changes the pod out every 2-3 days. She says from now on she will use her arms or her abdomen.  Will d/c Novolog correction and order insulin pump order set. Messaged Dr. [facility] for orders. Pt placing insulin pump on around 1230.  Thanks,  Tama Headings RN, MSN, BC-ADM Inpatient Diabetes Coordinator Team Pager [phone] (8a-5p)

## 2020-05-31 NOTE — ED Notes (Signed)
Admitting MD Elgergawy messaged regarding insulin drip being stopped by previous shift per Endotool recommendations, also made aware that 2nd run of potassium held by previous shift and by this RN pending recollected BMP resulting, BMP recollected at request of lab on previous shift.

## 2020-05-31 NOTE — ED Notes (Signed)
Admitting MD made aware BMP resulted at this time. Awaiting verification of 2nd run of potassium due to patient no longer being on inulin drip.

## 2020-05-31 NOTE — ED Notes (Signed)
CBG obtained by this RN. Order parameters not met for sliding scale insulin. Medication administered per order. Pt tolerated well. Pt delivered breakfast tray at this time.

## 2020-05-31 NOTE — ED Triage Notes (Signed)
Pt states she feels "sick" and her blood sugar is greater than 600. Pt recently started on a insulin pump and believes that her pump is not giving her enough insulin. Pt states has felt nauseated and had one episode of emesis.

## 2020-06-01 DIAGNOSIS — N184 Chronic kidney disease, stage 4 (severe): Secondary | ICD-10-CM | POA: Diagnosis not present

## 2020-06-01 DIAGNOSIS — N179 Acute kidney failure, unspecified: Secondary | ICD-10-CM | POA: Diagnosis not present

## 2020-06-01 DIAGNOSIS — E1100 Type 2 diabetes mellitus with hyperosmolarity without nonketotic hyperglycemic-hyperosmolar coma (NKHHC): Secondary | ICD-10-CM | POA: Diagnosis not present

## 2020-06-01 DIAGNOSIS — E1165 Type 2 diabetes mellitus with hyperglycemia: Secondary | ICD-10-CM | POA: Diagnosis not present

## 2020-06-01 LAB — CBC
HCT: 38.9 % (ref 36.0–46.0)
Hemoglobin: 12.7 g/dL (ref 12.0–15.0)
MCH: 28.3 pg (ref 26.0–34.0)
MCHC: 32.6 g/dL (ref 30.0–36.0)
MCV: 86.8 fL (ref 80.0–100.0)
Platelets: 384 10*3/uL (ref 150–400)
RBC: 4.48 MIL/uL (ref 3.87–5.11)
RDW: 14.9 % (ref 11.5–15.5)
WBC: 13.6 10*3/uL — ABNORMAL HIGH (ref 4.0–10.5)
nRBC: 0 % (ref 0.0–0.2)

## 2020-06-01 LAB — BASIC METABOLIC PANEL
Anion gap: 8 (ref 5–15)
BUN: 38 mg/dL — ABNORMAL HIGH (ref 8–23)
CO2: 28 mmol/L (ref 22–32)
Calcium: 9.4 mg/dL (ref 8.9–10.3)
Chloride: 103 mmol/L (ref 98–111)
Creatinine, Ser: 1.77 mg/dL — ABNORMAL HIGH (ref 0.44–1.00)
GFR, Estimated: 31 mL/min — ABNORMAL LOW (ref >60)
Glucose, Bld: 141 mg/dL — ABNORMAL HIGH (ref 70–99)
Potassium: 4.4 mmol/L (ref 3.5–5.1)
Sodium: 139 mmol/L (ref 135–145)

## 2020-06-01 LAB — GLUCOSE, CAPILLARY
Glucose-Capillary: 151 mg/dL — ABNORMAL HIGH (ref 70–99)
Glucose-Capillary: 114 mg/dL — ABNORMAL HIGH (ref 70–99)
Glucose-Capillary: 91 mg/dL (ref 70–99)

## 2020-06-01 MED ORDER — INSULIN PUMP: SUBCUTANEOUS | Status: AC

## 2020-06-01 MED ORDER — HYDRALAZINE HCL 20 MG/ML IJ SOLN
10.0000 mg | Freq: Four times a day (QID) | INTRAMUSCULAR | Status: DC | PRN
  Administered 2020-06-01: 03:00:00 10 mg via INTRAVENOUS
  Filled 2020-06-01: qty 1

## 2020-06-01 NOTE — Discharge Summary (Signed)
Physician Discharge Summary  Jocelyn Wells XBD:[id number] DOB: December 18, 1955 DOA: 05/31/2020  PCP: Margo Common, PA-C  Admit date: 05/31/2020 Discharge date: 06/01/2020  Admitted From: Home Disposition:  Home  Recommendations for Outpatient Follow-up:  1. Follow up with PCP in 1-2 weeks 2. Please obtain BMP/CBC in one week  Home Health: NO Equipment/Devices:None  Discharge Condition:Stable CODE STATUS:FULL Diet recommendation: Heart Healthy / Carb Modified   Brief/Interim Summary: You may copy/paste interim summary or write brief hospital course depending on length of stay  Discharge Diagnoses:  Principal Problem:   Type 2 diabetes mellitus with hyperosmolar hyperglycemic state (HHS) ([place]) Active Problems:   Essential hypertension   Chronic systolic heart failure (HCC)   COPD (chronic obstructive pulmonary disease) (HCC)   Depression   CKD (chronic kidney disease), stage IV (HCC)   History of CVA (cerebrovascular accident)   Hyperglycemia   65 year old female with type 2 diabetes on insulin pump followed by endocrinology, HTN, COPD, depression, stage IV CKD, history of CVA and chronic systolic heart failure, who presents to the emergency room with generalized malaise and elevated blood sugar readings greater than 600.    Type 2 diabetes mellitus with hyperosmolar hyperglycemic state -Patient presents with malaise with blood sugar over 600 - Was treated with insulin infusion, she has NO dka ,normal bicarb, no anion gap . -Discussed with patient, was recently started on an insulin pump, bradycardia knitter was consulted, she spoke with the patient regarding applying her insulin pump, has been on insulin pump for last 2 weeks, this is the first time she did apply to her legs, she required insulin infusion for first couple hours, but then her BG has normalized, she was seen by diabetic coordinator, she was started back on her insulin pump regimen, was monitored overnight,  no issues with insulin pump, she will be discharged today on her insulin pump, and home regimen including Trulicity and Glucovance.   Essential hypertension -Continue home meds  Chronic systolic heart failure (HCC) COPD (chronic obstructive pulmonary disease) ([place]) -Not acutely exacerbated -Continue home meds  Depression -continue home meds  CKD (chronic kidney disease), stage IV (HCC) -Creatinine 2.49, slightly above baseline of 2.0 in January 2022, improved, it is 1.77 today.  History of CVA (cerebrovascular accident) -continue aspirin and statin    Discharge Instructions  Discharge Instructions    Discharge instructions   Complete by: As directed    Follow with Primary MD Chrismon, Vickki Muff, PA-C in 7 days   Get CBC, CMP, 2 view Chest X ray checked  by Primary MD next visit.    Activity: As tolerated with Full fall precautions use walker/cane & assistance as needed   Disposition Home    Diet: Heart Healthy /carb modified.  On your next visit with your primary care physician please Get Medicines reviewed and adjusted.   Please request your Prim.MD to go over all Hospital Tests and Procedure/Radiological results at the follow up, please get all Hospital records sent to your Prim MD by signing hospital release before you go home.   If you experience worsening of your admission symptoms, develop shortness of breath, life threatening emergency, suicidal or homicidal thoughts you must seek medical attention immediately by calling 911 or calling your MD immediately  if symptoms less severe.  You Must read complete instructions/literature along with all the possible adverse reactions/side effects for all the Medicines you take and that have been prescribed to you. Take any new Medicines after you have completely understood and  accpet all the possible adverse reactions/side effects.   Do not drive, operating heavy machinery, perform activities at  heights, swimming or participation in water activities or provide baby sitting services if your were admitted for syncope or siezures until you have seen by Primary MD or a Neurologist and advised to do so again.  Do not drive when taking Pain medications.    Do not take more than prescribed Pain, Sleep and Anxiety Medications  Special Instructions: If you have smoked or chewed Tobacco  in the last 2 yrs please stop smoking, stop any regular Alcohol  and or any Recreational drug use.  Wear Seat belts while driving.   Please note  You were cared for by a hospitalist during your hospital stay. If you have any questions about your discharge medications or the care you received while you were in the hospital after you are discharged, you can call the unit and asked to speak with the hospitalist on call if the hospitalist that took care of you is not available. Once you are discharged, your primary care physician will handle any further medical issues. Please note that NO REFILLS for any discharge medications will be authorized once you are discharged, as it is imperative that you return to your primary care physician (or establish a relationship with a primary care physician if you do not have one) for your aftercare needs so that they can reassess your need for medications and monitor your lab values.   Increase activity slowly   Complete by: As directed      Allergies as of 06/01/2020      Reactions   Codeine Nausea And Vomiting      Medication List    STOP taking these medications   insulin aspart 100 UNIT/ML injection Commonly known as: novoLOG   insulin glargine 100 UNIT/ML injection Commonly known as: LANTUS     TAKE these medications   apixaban 2.5 MG Tabs tablet Commonly known as: Eliquis Take 1 tablet (2.5 mg total) by mouth 2 (two) times daily.   aspirin EC 81 MG tablet Take 81 mg by mouth daily. Swallow whole.   atorvastatin 40 MG tablet Commonly known as:  LIPITOR TAKE 1 TABLET BY MOUTH EVERY DAY   B-D INS SYRINGE 0.5CC/31GX5/16 31G X 5/16" 0.5 ML Misc Generic drug: Insulin Syringe-Needle U-100   denosumab 60 MG/ML Sosy injection Commonly known as: PROLIA Inject 60 mg into the skin every 6 (six) months.   gabapentin 300 MG capsule Commonly known as: NEURONTIN Take 1 capsule (300 mg total) by mouth daily. What changed: when to take this   glucose blood test strip OneTouch Verio strips   glyBURIDE-metformin 5-500 MG tablet Commonly known as: GLUCOVANCE Take 2 tablets by mouth 2 (two) times daily.   hydrochlorothiazide 25 MG tablet Commonly known as: HYDRODIURIL Take 25 mg by mouth daily.   insulin pump Soln Per endocrinoloy   loratadine 10 MG tablet Commonly known as: CLARITIN Take 1 tablet by mouth daily.   losartan 25 MG tablet Commonly known as: COZAAR TAKE 2 TABLETS BY MOUTH EVERY DAY   mupirocin ointment 2 % Commonly known as: BACTROBAN mupirocin 2 % topical ointment   PARoxetine 40 MG tablet Commonly known as: PAXIL Take 1 tablet (40 mg total) by mouth daily.   traMADol 50 MG tablet Commonly known as: ULTRAM Take 1 tablet (50 mg total) by mouth 2 (two) times daily as needed for severe pain. Month last 30 days.   Trulicity 1.5  MG/0.5ML Sopn Generic drug: Dulaglutide Inject 1.5 mg into the skin every 7 (seven) days. (Monday Nights)   Vitamin D (Ergocalciferol) 1.25 MG (50000 UNIT) Caps capsule Commonly known as: DRISDOL Take 50,000 Units by mouth once a week. (Sundays)       Allergies  Allergen Reactions  . Codeine Nausea And Vomiting    Consultations: none  Procedures/Studies: DG Chest Portable 1 View  Result Date: 05/31/2020 CLINICAL DATA:  Hypoxemia. High blood sugar. History of COPD. Nausea and vomiting. EXAM: PORTABLE CHEST 1 VIEW COMPARISON:  04/19/2017 FINDINGS: Shallow inspiration. Heart size and pulmonary vascularity are normal for inspiratory effort. No airspace disease or consolidation  in the lungs. No pleural effusions. No pneumothorax. Mediastinal contours appear intact. A loop recorder is present. Calcified and tortuous aorta. IMPRESSION: No active disease. Electronically Signed   By: Lucienne Capers M.D.   On: 05/31/2020 03:17       Subjective:  She denies any complaints today, no nausea, no vomiting, good appetite. Discharge Exam: Vitals:   06/01/20 0621 06/01/20 0807  BP: (!) 169/88 (!) 157/76  Pulse: 75 78  Resp:  18  Temp:  97.7 F (36.5 C)  SpO2: 94% 94%   Vitals:   06/01/20 0058 06/01/20 0541 06/01/20 0621 06/01/20 0807  BP: (!) 171/85 (!) 188/81 (!) 169/88 (!) 157/76  Pulse: 75 79 75 78  Resp: 18 18  18   Temp: 98.4 F (36.9 C) (!) 97.5 F (36.4 C)  97.7 F (36.5 C)  TempSrc: Oral Oral    SpO2: 92% 94% 94% 94%  Weight:      Height:        General: Pt is alert, awake, not in acute distress Cardiovascular: RRR, S1/S2 +, no rubs, no gallops Respiratory: CTA bilaterally, no wheezing, no rhonchi Abdominal: Soft, NT, ND, bowel sounds + Extremities: no edema, no cyanosis    The results of significant diagnostics from this hospitalization (including imaging, microbiology, ancillary and laboratory) are listed below for reference.     Microbiology: Recent Results (from the past 240 hour(s))  Resp Panel by RT-PCR (Flu A&B, Covid) Nasopharyngeal Swab     Status: None   Collection Time: 05/31/20  3:37 AM   Specimen: Nasopharyngeal Swab; Nasopharyngeal(NP) swabs in vial transport medium  Result Value Ref Range Status   SARS Coronavirus 2 by RT PCR NEGATIVE NEGATIVE Final    Comment: (NOTE) SARS-CoV-2 target nucleic acids are NOT DETECTED.  The SARS-CoV-2 RNA is generally detectable in upper respiratory specimens during the acute phase of infection. The lowest concentration of SARS-CoV-2 viral copies this assay can detect is 138 copies/mL. A negative result does not preclude SARS-Cov-2 infection and should not be used as the sole basis for  treatment or other patient management decisions. A negative result may occur with  improper specimen collection/handling, submission of specimen other than nasopharyngeal swab, presence of viral mutation(s) within the areas targeted by this assay, and inadequate number of viral copies(<138 copies/mL). A negative result must be combined with clinical observations, patient history, and epidemiological information. The expected result is Negative.  Fact Sheet for Patients:  EntrepreneurPulse.com.au  Fact Sheet for Healthcare Providers:  IncredibleEmployment.be  This test is no t yet approved or cleared by the [place] FDA and  has been authorized for detection and/or diagnosis of SARS-CoV-2 by FDA under an Emergency Use Authorization (EUA). This EUA will remain  in effect (meaning this test can be used) for the duration of the COVID-19 declaration under Section 564(b)(1) of  the Act, 21 U.S.C.section 360bbb-3(b)(1), unless the authorization is terminated  or revoked sooner.       Influenza A by PCR NEGATIVE NEGATIVE Final   Influenza B by PCR NEGATIVE NEGATIVE Final    Comment: (NOTE) The Xpert Xpress SARS-CoV-2/FLU/RSV plus assay is intended as an aid in the diagnosis of influenza from Nasopharyngeal swab specimens and should not be used as a sole basis for treatment. Nasal washings and aspirates are unacceptable for Xpert Xpress SARS-CoV-2/FLU/RSV testing.  Fact Sheet for Patients: EntrepreneurPulse.com.au  Fact Sheet for Healthcare Providers: IncredibleEmployment.be  This test is not yet approved or cleared by the [place] FDA and has been authorized for detection and/or diagnosis of SARS-CoV-2 by FDA under an Emergency Use Authorization (EUA). This EUA will remain in effect (meaning this test can be used) for the duration of the COVID-19 declaration under Section 564(b)(1) of the Act, 21  U.S.C. section 360bbb-3(b)(1), unless the authorization is terminated or revoked.  Performed at [facility], [address] [place], [place] [address]      Labs: BNP (last 3 results) No results for input(s): BNP in the last 8760 hours. Basic Metabolic Panel: Recent Labs  Lab 05/31/20 0219 05/31/20 0714 05/31/20 1046 06/01/20 0440  NA 127* 133* 132* 139  K 4.3 4.0 4.5 4.4  CL 88* 97* 94* 103  CO2 24 25 26 28   GLUCOSE 778* 113* 383* 141*  BUN 44* 40* 40* 38*  CREATININE 2.49* 2.02* 1.88* 1.77*  CALCIUM 9.2 9.2 9.0 9.4   Liver Function Tests: Recent Labs  Lab 05/31/20 0219  AST 21  ALT 18  ALKPHOS 71  BILITOT 0.6  PROT 7.1  ALBUMIN 3.7   No results for input(s): LIPASE, AMYLASE in the last 168 hours. No results for input(s): AMMONIA in the last 168 hours. CBC: Recent Labs  Lab 05/31/20 0219 06/01/20 0440  WBC 11.5* 13.6*  NEUTROABS 8.4*  --   HGB 13.8 12.7  HCT 43.1 38.9  MCV 88.7 86.8  PLT 406* 384   Cardiac Enzymes: No results for input(s): CKTOTAL, CKMB, CKMBINDEX, TROPONINI in the last 168 hours. BNP: Invalid input(s): POCBNP CBG: Recent Labs  Lab 05/31/20 2114 05/31/20 2200 06/01/20 0212 06/01/20 0544 06/01/20 0806  GLUCAP 58* 104* 151* 114* 91   D-Dimer No results for input(s): DDIMER in the last 72 hours. Hgb A1c No results for input(s): HGBA1C in the last 72 hours. Lipid Profile No results for input(s): CHOL, HDL, LDLCALC, TRIG, CHOLHDL, LDLDIRECT in the last 72 hours. Thyroid function studies No results for input(s): TSH, T4TOTAL, T3FREE, THYROIDAB in the last 72 hours.  Invalid input(s): FREET3 Anemia work up No results for input(s): VITAMINB12, FOLATE, FERRITIN, TIBC, IRON, RETICCTPCT in the last 72 hours. Urinalysis    Component Value Date/Time   COLORURINE STRAW (A) 05/31/2020 0707   APPEARANCEUR CLEAR (A) 05/31/2020 0707   LABSPEC 1.017 05/31/2020 0707   PHURINE 5.0 05/31/2020 0707   GLUCOSEU >=500 (A)  05/31/2020 0707   HGBUR MODERATE (A) 05/31/2020 0707   BILIRUBINUR NEGATIVE 05/31/2020 0707   KETONESUR NEGATIVE 05/31/2020 0707   PROTEINUR 100 (A) 05/31/2020 0707   NITRITE NEGATIVE 05/31/2020 0707   LEUKOCYTESUR NEGATIVE 05/31/2020 0707   Sepsis Labs Invalid input(s): PROCALCITONIN,  WBC,  LACTICIDVEN Microbiology Recent Results (from the past 240 hour(s))  Resp Panel by RT-PCR (Flu A&B, Covid) Nasopharyngeal Swab     Status: None   Collection Time: 05/31/20  3:37 AM   Specimen: Nasopharyngeal Swab; Nasopharyngeal(NP) swabs  in vial transport medium  Result Value Ref Range Status   SARS Coronavirus 2 by RT PCR NEGATIVE NEGATIVE Final    Comment: (NOTE) SARS-CoV-2 target nucleic acids are NOT DETECTED.  The SARS-CoV-2 RNA is generally detectable in upper respiratory specimens during the acute phase of infection. The lowest concentration of SARS-CoV-2 viral copies this assay can detect is 138 copies/mL. A negative result does not preclude SARS-Cov-2 infection and should not be used as the sole basis for treatment or other patient management decisions. A negative result may occur with  improper specimen collection/handling, submission of specimen other than nasopharyngeal swab, presence of viral mutation(s) within the areas targeted by this assay, and inadequate number of viral copies(<138 copies/mL). A negative result must be combined with clinical observations, patient history, and epidemiological information. The expected result is Negative.  Fact Sheet for Patients:  EntrepreneurPulse.com.au  Fact Sheet for Healthcare Providers:  IncredibleEmployment.be  This test is no t yet approved or cleared by the [place] FDA and  has been authorized for detection and/or diagnosis of SARS-CoV-2 by FDA under an Emergency Use Authorization (EUA). This EUA will remain  in effect (meaning this test can be used) for the duration of the COVID-19  declaration under Section 564(b)(1) of the Act, 21 U.S.C.section 360bbb-3(b)(1), unless the authorization is terminated  or revoked sooner.       Influenza A by PCR NEGATIVE NEGATIVE Final   Influenza B by PCR NEGATIVE NEGATIVE Final    Comment: (NOTE) The Xpert Xpress SARS-CoV-2/FLU/RSV plus assay is intended as an aid in the diagnosis of influenza from Nasopharyngeal swab specimens and should not be used as a sole basis for treatment. Nasal washings and aspirates are unacceptable for Xpert Xpress SARS-CoV-2/FLU/RSV testing.  Fact Sheet for Patients: EntrepreneurPulse.com.au  Fact Sheet for Healthcare Providers: IncredibleEmployment.be  This test is not yet approved or cleared by the [place] FDA and has been authorized for detection and/or diagnosis of SARS-CoV-2 by FDA under an Emergency Use Authorization (EUA). This EUA will remain in effect (meaning this test can be used) for the duration of the COVID-19 declaration under Section 564(b)(1) of the Act, 21 U.S.C. section 360bbb-3(b)(1), unless the authorization is terminated or revoked.  Performed at [facility], [address], [place], [place] [address]      Time coordinating discharge: Over 30 minutes  SIGNED:   Phillips Climes, MD  [facility] 06/01/2020, 10:48 AM Pager   If 7PM-7AM, please contact night-coverage [email or web address] Password [email or web address]

## 2020-06-01 NOTE — Progress Notes (Signed)
Inpatient Diabetes Program Recommendations  AACE/ADA: New Consensus Statement on Inpatient Glycemic Control (2015)  Target Ranges:  Prepandial:   less than 140 mg/dL      Peak postprandial:   less than 180 mg/dL (1-2 hours)      Critically ill patients:  140 - 180 mg/dL   Lab Results  Component Value Date   GLUCAP 91 06/01/2020   HGBA1C 11.4 03/28/2019    Review of Glycemic Control Results for KENZY, CAMPOVERDE (MRN [id number]) as of 06/01/2020 08:46  Ref. Range 05/31/2020 12:31 05/31/2020 17:00 05/31/2020 21:14 05/31/2020 22:00 06/01/2020 02:12 06/01/2020 05:44 06/01/2020 08:06  Glucose-Capillary Latest Ref Range: 70 - 99 mg/dL 403 (H) 257 (H) 58 (L) 104 (H) 151 (H) 114 (H) 91   Diabetes history: DM type 2, Sees Dr. Gabriel Carina last visit on 14th Outpatient Diabetes medications: Omnipod insulin pump with Novolog  Current orders for Inpatient glycemic control: Novolog 0-20 units + hs  778 on presentation  Inpatient Diabetes Program Recommendations:    Glucose 58 last night after pt corrected hyperglycemia via insulin pump. Glucose trends stable since that time. Renal function was also more elevated yesterday and pt may have held onto insulin longer. Pt to monitor at home replace insulin pump site if trends become elevated and not responding to boluses when at home.   Thanks,  Tama Headings RN, MSN, BC-ADM Inpatient Diabetes Coordinator Team Pager [phone] (8a-5p)

## 2020-06-01 NOTE — Discharge Instructions (Signed)
Follow with Primary MD Chrismon, Vickki Muff, PA-C in 7 days   Get CBC, CMP, 2 view Chest X ray checked  by Primary MD next visit.    Activity: As tolerated with Full fall precautions use walker/cane & assistance as needed   Disposition Home    Diet: Heart Healthy /carb modified.  On your next visit with your primary care physician please Get Medicines reviewed and adjusted.   Please request your Prim.MD to go over all Hospital Tests and Procedure/Radiological results at the follow up, please get all Hospital records sent to your Prim MD by signing hospital release before you go home.   If you experience worsening of your admission symptoms, develop shortness of breath, life threatening emergency, suicidal or homicidal thoughts you must seek medical attention immediately by calling 911 or calling your MD immediately  if symptoms less severe.  You Must read complete instructions/literature along with all the possible adverse reactions/side effects for all the Medicines you take and that have been prescribed to you. Take any new Medicines after you have completely understood and accpet all the possible adverse reactions/side effects.   Do not drive, operating heavy machinery, perform activities at heights, swimming or participation in water activities or provide baby sitting services if your were admitted for syncope or siezures until you have seen by Primary MD or a Neurologist and advised to do so again.  Do not drive when taking Pain medications.    Do not take more than prescribed Pain, Sleep and Anxiety Medications  Special Instructions: If you have smoked or chewed Tobacco  in the last 2 yrs please stop smoking, stop any regular Alcohol  and or any Recreational drug use.  Wear Seat belts while driving.   Please note  You were cared for by a hospitalist during your hospital stay. If you have any questions about your discharge medications or the care you received while you were in  the hospital after you are discharged, you can call the unit and asked to speak with the hospitalist on call if the hospitalist that took care of you is not available. Once you are discharged, your primary care physician will handle any further medical issues. Please note that NO REFILLS for any discharge medications will be authorized once you are discharged, as it is imperative that you return to your primary care physician (or establish a relationship with a primary care physician if you do not have one) for your aftercare needs so that they can reassess your need for medications and monitor your lab values.

## 2020-06-02 LAB — HEMOGLOBIN A1C
Hgb A1c MFr Bld: 10.2 % — ABNORMAL HIGH (ref 4.8–5.6)
Mean Plasma Glucose: 246 mg/dL

## 2020-06-10 ENCOUNTER — Ambulatory Visit (INDEPENDENT_AMBULATORY_CARE_PROVIDER_SITE_OTHER): Payer: Medicare Other

## 2020-06-10 DIAGNOSIS — Z794 Long term (current) use of insulin: Secondary | ICD-10-CM | POA: Diagnosis not present

## 2020-06-10 DIAGNOSIS — E1143 Type 2 diabetes mellitus with diabetic autonomic (poly)neuropathy: Secondary | ICD-10-CM | POA: Diagnosis not present

## 2020-06-10 NOTE — Chronic Care Management (AMB) (Signed)
Chronic Care Management   Follow Up Note   06/10/2020 Name: Jocelyn Wells MRN: [id number] DOB: 1956-01-29  Primary Care Provider: Margo Common, PA-C Reason for referral : Chronic Care Management   Jocelyn Wells is a 65 y.o. year old female who is a primary care patient of Chrismon, Vickki Muff, PA-C. She is currently enrolled in the Chronic Care Management program. A routine telephonic outreach was conducted today.  Review of Jocelyn Wells' status, including review of consultants reports, relevant labs and test results was conducted today. Collaboration with appropriate care team members was performed as part of the comprehensive evaluation and provision of chronic care management services.    SDOH (Social Determinants of Health) assessments performed: No    Outpatient Encounter Medications as of 06/10/2020  Medication Sig  . apixaban (ELIQUIS) 2.5 MG TABS tablet Take 1 tablet (2.5 mg total) by mouth 2 (two) times daily. (Patient not taking: No sig reported)  . aspirin EC 81 MG tablet Take 81 mg by mouth daily. Swallow whole.  Marland Kitchen atorvastatin (LIPITOR) 40 MG tablet TAKE 1 TABLET BY MOUTH EVERY DAY  . B-D INS SYRINGE 0.5CC/31GX5/16 31G X 5/16" 0.5 ML MISC   . denosumab (PROLIA) 60 MG/ML SOSY injection Inject 60 mg into the skin every 6 (six) months.  . Dulaglutide (TRULICITY) 1.5 WU/9.8JX SOPN Inject 1.5 mg into the skin every 7 (seven) days. (Monday Nights)  . gabapentin (NEURONTIN) 300 MG capsule Take 1 capsule (300 mg total) by mouth daily. (Patient taking differently: Take 300 mg by mouth 2 (two) times daily.)  . glucose blood test strip OneTouch Verio strips  . glyBURIDE-metformin (GLUCOVANCE) 5-500 MG per tablet Take 2 tablets by mouth 2 (two) times daily.   . hydrochlorothiazide (HYDRODIURIL) 25 MG tablet Take 25 mg by mouth daily.  . Insulin Human (INSULIN PUMP) SOLN Per endocrinoloy  . loratadine (CLARITIN) 10 MG tablet Take 1 tablet by mouth daily.  Marland Kitchen losartan (COZAAR) 25 MG  tablet TAKE 2 TABLETS BY MOUTH EVERY DAY  . mupirocin ointment (BACTROBAN) 2 % mupirocin 2 % topical ointment (Patient not taking: No sig reported)  . PARoxetine (PAXIL) 40 MG tablet Take 1 tablet (40 mg total) by mouth daily.  . traMADol (ULTRAM) 50 MG tablet Take 1 tablet (50 mg total) by mouth 2 (two) times daily as needed for severe pain. Month last 30 days.  . Vitamin D, Ergocalciferol, (DRISDOL) 1.25 MG (50000 UT) CAPS capsule Take 50,000 Units by mouth once a week. (Sundays)   No facility-administered encounter medications on file as of 06/10/2020.       Objective:  Patient Care Plan: Diabetes Type 2 (Adult)  Problem Identified: Glycemic Management (Diabetes, Type 2)     Long-Range Goal: Glycemic Management Optimized   Start Date: 06/10/2020  Expected End Date: 10/08/2020  Priority: High  Note:    Lab Results  Component Value Date   HGBA1C 10.2 (H) 05/31/2020     Current Barriers:  Marland Kitchen Knowledge Deficits related to basic Diabetes pathophysiology and self care/management  Case Manager Clinical Goal(s):  Over the next 120 days, patient will: Marland Kitchen Demonstrate improved adherence to prescribed treatment plan for diabetes self care/management as evidenced by daily monitoring and recording of CBG, adhering to recommended ADA/ carb modified diet, and adhering to prescribed medication regimen. . Lower A1C by at least 1 point.  Interventions:  . Collaboration with Chrismon, Vickki Muff, PA-C regarding development and update of comprehensive plan of care as evidenced by provider attestation  and co-signature . Inter-disciplinary care team collaboration (see longitudinal plan of care) . Reviewed medications and compliance with current treatment plan. Reports taking medications as prescribed. She started insulin pump therapy in April. Reports initially managing well. She experienced a mishap after changing the site from her abdomen to her leg. The pump was not delivering insulin properly d/t to the  location. She was hospitalized for hyperosmolar hyperglycemic state. We thoroughly discussed alternate sites for insulin pump placement. She is using a [employer] for glucose monitoring. Reports checking her readings at least three times a day. Recalls two elevated readings greater than 200 since her hospital discharge. She verbalized understanding of the s/sx of hypoglycemia and hyperglycemia along with recommended interventions.  . Discussed available Diabetes education and nutrition classes. Declined need for additional resources. Reports a pending telephonic outreach with the Diabetic Coordinator. She agreed to call if additional resources are needed. . Discussed need for a hospital follow-up. She will complete a clinic visit with her PCP on 06/26/20. . Thoroughly reviewed s/sx and indications for seeking immediate medical attention.    Patient Goals/Self-Care Activities -Self administer oral medications and insulin as prescribed -Monitor blood glucose levels consistently and utilize recommended interventions -Rotate insulin pump site and use areas with a reasonable "fat pad" as recommended -Adhere to prescribed ADA/carb modified -Complete follow up with the Diabetic Coordinator as scheduled -Complete PCP follow up as scheduled -Notify provider or care management team with health related questions and concern as needed   Follow Up Plan:  Will follow up next month      PLAN A member of the care management team will follow up with Jocelyn Wells next month.    [facility] Care Management [facility] [phone]

## 2020-06-21 ENCOUNTER — Other Ambulatory Visit: Payer: Self-pay | Admitting: Family Medicine

## 2020-06-21 DIAGNOSIS — E1143 Type 2 diabetes mellitus with diabetic autonomic (poly)neuropathy: Secondary | ICD-10-CM

## 2020-06-21 DIAGNOSIS — I10 Essential (primary) hypertension: Secondary | ICD-10-CM

## 2020-06-21 DIAGNOSIS — Z794 Long term (current) use of insulin: Secondary | ICD-10-CM

## 2020-06-22 NOTE — Telephone Encounter (Signed)
Requested medication (s) are due for refill today: yes  Requested medication (s) are on the active medication list: yes  Last refill:  05/05/20 was a 30 day courtesy refill  Future visit scheduled: yes (06/26/20)  Notes to clinic:  overdue lab work, courtesy refill already given   Requested Prescriptions  Pending Prescriptions Disp Refills   losartan (COZAAR) 25 MG tablet [Pharmacy Med Name: LOSARTAN POTASSIUM 25 MG TAB] 30 tablet 0    Sig: TAKE 2 TABLETS BY MOUTH EVERY DAY      Cardiovascular:  Angiotensin Receptor Blockers Failed - 06/21/2020  7:28 PM      Failed - Cr in normal range and within 180 days    Creat  Date Value Ref Range Status  03/21/2015 1.37 (H) 0.50 - 0.99 mg/dL Final   Creatinine, Ser  Date Value Ref Range Status  06/01/2020 1.77 (H) 0.44 - 1.00 mg/dL Final          Failed - Last BP in normal range    BP Readings from Last 1 Encounters:  06/01/20 (!) 157/76          Failed - Valid encounter within last 6 months    Recent Outpatient Visits           1 year ago Type 2 diabetes mellitus with diabetic autonomic neuropathy, with long-term current use of insulin ([place])   [employer], Vickki Muff, PA-C   2 years ago Impaired balance as late effect of cerebrovascular accident   [employer], Vickki Muff, PA-C   2 years ago Cerebrovascular accident (CVA) due to embolism of anterior cerebral artery, unspecified blood vessel laterality ([place])   [facility], Vickki Muff, PA-C   2 years ago Acute gout of right ankle, unspecified cause   [facility], Dionne Bucy, MD   3 years ago Subacute pansinusitis   [employer], Vickki Muff, PA-C       Future Appointments             In 4 days Murphy, PA-C [employer], [place] - K in normal range and within 180 days    Potassium  Date Value Ref Range Status  06/01/2020  4.4 3.5 - 5.1 mmol/L Final          Passed - Patient is not pregnant

## 2020-06-23 NOTE — Patient Instructions (Signed)
Thank you for allowing the Chronic Care Management team to participate in your care. It was a pleasure speaking with you. Please feel free to contact me with questions.   Goals Addressed: Patient Care Plan: Diabetes Type 2 (Adult)  Problem Identified: Glycemic Management (Diabetes, Type 2)     Long-Range Goal: Glycemic Management Optimized   Start Date: 06/10/2020  Expected End Date: 10/08/2020  Priority: High  Note:    Lab Results  Component Value Date   HGBA1C 10.2 (H) 05/31/2020     Current Barriers:  Marland Kitchen Knowledge Deficits related to basic Diabetes pathophysiology and self care/management  Case Manager Clinical Goal(s):  Over the next 120 days, patient will: Marland Kitchen Demonstrate improved adherence to prescribed treatment plan for diabetes self care/management as evidenced by daily monitoring and recording of CBG, adhering to recommended ADA/ carb modified diet, and adhering to prescribed medication regimen. . Lower A1C by at least 1 point.  Interventions:  . Collaboration with Chrismon, Vickki Muff, PA-C regarding development and update of comprehensive plan of care as evidenced by provider attestation and co-signature . Inter-disciplinary care team collaboration (see longitudinal plan of care) . Reviewed medications and compliance with current treatment plan. Reports taking medications as prescribed. She started insulin pump therapy in April. Reports initially managing well. She experienced a mishap after changing the site from her abdomen to her leg. The pump was not delivering insulin properly d/t to the location. She was hospitalized for hyperosmolar hyperglycemic state. We thoroughly discussed alternate sites for insulin pump placement. She is using a [employer] for glucose monitoring. Reports checking her readings at least three times a day. Recalls two elevated readings greater than 200 since her hospital discharge. She verbalized understanding of the s/sx of hypoglycemia and  hyperglycemia along with recommended interventions.  . Discussed available Diabetes education and nutrition classes. Declined need for additional resources. Reports a pending telephonic outreach with the Diabetic Coordinator. She agreed to call if additional resources are needed. . Discussed need for a hospital follow-up. She will complete a clinic visit with her PCP on 06/26/20. . Thoroughly reviewed s/sx and indications for seeking immediate medical attention.    Patient Goals/Self-Care Activities -Self administer oral medications and insulin as prescribed -Monitor blood glucose levels consistently and utilize recommended interventions -Rotate insulin pump site and use areas with a reasonable "fat pad" as recommended -Adhere to prescribed ADA/carb modified -Complete follow up with the Diabetic Coordinator as scheduled -Complete PCP follow up as scheduled -Notify provider or care management team with health related questions and concern as needed   Follow Up Plan:  Will follow up next month       Ms. Maclin verbalized understanding of the information discussed during the telephonic outreach today. Declined need for mailed/printed instructions. A member of the care management team will follow up next month.    [facility] Care Management [facility] [phone]

## 2020-06-26 ENCOUNTER — Inpatient Hospital Stay: Payer: Medicare Other | Admitting: Family Medicine

## 2020-06-26 ENCOUNTER — Other Ambulatory Visit: Payer: Self-pay

## 2020-06-26 DIAGNOSIS — Z20822 Contact with and (suspected) exposure to covid-19: Secondary | ICD-10-CM | POA: Diagnosis not present

## 2020-06-26 NOTE — Progress Notes (Deleted)
  {  This patient's chart is due for periodic physician review. Please check 'Cosign Required' and forward to your supervising physician.:1}  Established patient visit   Patient: Jocelyn Wells   DOB: 12-29-1955   65 y.o. Female  MRN: [id number] Visit Date: 06/26/2020  Today's healthcare provider: Vernie Murders, PA-C   No chief complaint on file.  Subjective    HPI  Follow up Hospitalization  Patient was admitted to [facility] on 05/31/2020 and discharged on 06/01/2020. She was treated for generalized malaise and elevated blood sugar readings greater than 600. Treatment for this included insulin infusion. Telephone follow up was done on 06/10/2020. She reports {excellent/good/fair:19992} compliance with treatment. She reports this condition is {resolved/improved/worsened:23923}.  ----------------------------------------------------------------------------------------- -    {Show patient history (optional):23778::" "}   Medications: Outpatient Medications Prior to Visit  Medication Sig  . apixaban (ELIQUIS) 2.5 MG TABS tablet Take 1 tablet (2.5 mg total) by mouth 2 (two) times daily. (Patient not taking: No sig reported)  . aspirin EC 81 MG tablet Take 81 mg by mouth daily. Swallow whole.  Marland Kitchen atorvastatin (LIPITOR) 40 MG tablet TAKE 1 TABLET BY MOUTH EVERY DAY  . B-D INS SYRINGE 0.5CC/31GX5/16 31G X 5/16" 0.5 ML MISC   . denosumab (PROLIA) 60 MG/ML SOSY injection Inject 60 mg into the skin every 6 (six) months.  . Dulaglutide (TRULICITY) 1.5 JA/2.5KN SOPN Inject 1.5 mg into the skin every 7 (seven) days. (Monday Nights)  . gabapentin (NEURONTIN) 300 MG capsule Take 1 capsule (300 mg total) by mouth daily. (Patient taking differently: Take 300 mg by mouth 2 (two) times daily.)  . glucose blood test strip OneTouch Verio strips  . glyBURIDE-metformin (GLUCOVANCE) 5-500 MG per tablet Take 2 tablets by mouth 2 (two) times daily.   . hydrochlorothiazide (HYDRODIURIL) 25 MG tablet Take 25 mg  by mouth daily.  . Insulin Human (INSULIN PUMP) SOLN Per endocrinoloy  . loratadine (CLARITIN) 10 MG tablet Take 1 tablet by mouth daily.  Marland Kitchen losartan (COZAAR) 25 MG tablet TAKE 2 TABLETS BY MOUTH EVERY DAY  . mupirocin ointment (BACTROBAN) 2 % mupirocin 2 % topical ointment (Patient not taking: No sig reported)  . PARoxetine (PAXIL) 40 MG tablet Take 1 tablet (40 mg total) by mouth daily.  . traMADol (ULTRAM) 50 MG tablet Take 1 tablet (50 mg total) by mouth 2 (two) times daily as needed for severe pain. Month last 30 days.  . Vitamin D, Ergocalciferol, (DRISDOL) 1.25 MG (50000 UT) CAPS capsule Take 50,000 Units by mouth once a week. (Sundays)   No facility-administered medications prior to visit.    Review of Systems  {Labs  Heme  Chem  Endocrine  Serology  Results Review (optional):23779::" "}   Objective    There were no vitals taken for this visit. {Show previous vital signs (optional):23777::" "}   Physical Exam  ***  No results found for any visits on 06/26/20.  Assessment & Plan     ***  No follow-ups on file.      {provider attestation***:1}   Vernie Murders, Hershal Coria  [facility] [phone] (phone) [phone] (fax)  [place]

## 2020-07-10 ENCOUNTER — Ambulatory Visit (INDEPENDENT_AMBULATORY_CARE_PROVIDER_SITE_OTHER): Payer: Medicare Other | Admitting: Family Medicine

## 2020-07-10 ENCOUNTER — Other Ambulatory Visit: Payer: Self-pay

## 2020-07-10 ENCOUNTER — Encounter: Payer: Self-pay | Admitting: Family Medicine

## 2020-07-10 VITALS — BP 140/85 | HR 84 | Temp 97.8°F | Wt 233.0 lb

## 2020-07-10 DIAGNOSIS — E785 Hyperlipidemia, unspecified: Secondary | ICD-10-CM

## 2020-07-10 DIAGNOSIS — E1165 Type 2 diabetes mellitus with hyperglycemia: Secondary | ICD-10-CM | POA: Diagnosis not present

## 2020-07-10 DIAGNOSIS — E1100 Type 2 diabetes mellitus with hyperosmolarity without nonketotic hyperglycemic-hyperosmolar coma (NKHHC): Secondary | ICD-10-CM | POA: Diagnosis not present

## 2020-07-10 DIAGNOSIS — I10 Essential (primary) hypertension: Secondary | ICD-10-CM

## 2020-07-10 DIAGNOSIS — I63312 Cerebral infarction due to thrombosis of left middle cerebral artery: Secondary | ICD-10-CM | POA: Diagnosis not present

## 2020-07-10 DIAGNOSIS — N184 Chronic kidney disease, stage 4 (severe): Secondary | ICD-10-CM | POA: Diagnosis not present

## 2020-07-10 NOTE — Progress Notes (Signed)
Established patient visit   Patient: Jocelyn Wells   DOB: 1955-10-08   65 y.o. Female  MRN: [id number] Visit Date: 07/10/2020  Today's healthcare provider: Vernie Murders, PA-C   Chief Complaint  Patient presents with   Hospitalization Follow-up   Subjective    HPI  Follow up Hospitalization  Patient was admitted to [facility] on 05/31/2020 and discharged on 06/01/2020. She was treated for Type 2 diabetes with hyperosmolar hyperglycemic state. Treatment for this included Glucovance, Trulicity and Insulin pump.. Telephone follow up was done on N/A She reports excellent compliance with treatment. She reports this condition is improved.   ----------------------------------------------------------------------------------------   Patient Active Problem List   Diagnosis Date Noted   Type 2 diabetes mellitus with hyperosmolar hyperglycemic state (HHS) ([place]) 05/31/2020   CKD (chronic kidney disease), stage IV ([place]) 05/31/2020   History of CVA (cerebrovascular accident) 05/31/2020   Hyperglycemia 05/31/2020   MDD (major depressive disorder), recurrent, in full remission ([place]) 04/09/2019   Compression fracture of L1 lumbar vertebra ([place]) 01/30/2019   Compression fracture of body of thoracic vertebra ([place]) 01/30/2019   Lumbar foraminal stenosis (RIGHT L1) 01/30/2019   Spinal stenosis of lumbar region with neurogenic claudication (L4-L5) 01/30/2019   DVT (deep venous thrombosis) ([place]) 02/09/2018   CVA (cerebral vascular accident) ([place]) 10/05/2017   Depression 06/12/2015   Cerebrovascular accident (CVA) due to bilateral thrombosis of carotid arteries ([place]) 06/12/2015   Excessive daytime sleepiness 06/12/2015   Nicotine dependence 04/24/2015   Snoring 04/24/2015   Sleep paralysis, recurrent isolated 04/24/2015   Hypersomnia with sleep apnea 04/24/2015   Cataplexy 04/24/2015   Morbid obesity due to excess calories ([place]) 04/24/2015   COPD (chronic obstructive pulmonary disease)  ([place]) 04/24/2015   Cerebrovascular accident (CVA) due to thrombosis of left middle cerebral artery ([place]) 40/09/6759   Embolic stroke ([place]) 95/10/3265   Fatigue 04/23/2015   Type 2 diabetes mellitus ([place]) 12/45/8099   Chronic systolic heart failure ([place]) 03/25/2015   Stroke ([place]) 01/31/2015   Nausea & vomiting 01/29/2015   AKI (acute kidney injury) ([place]) 01/29/2015   CVA (cerebral infarction) 01/29/2015   Stroke (cerebrum) ([place]) 01/29/2015   Dizziness and giddiness 07/05/2014   Essential hypertension 07/05/2014   Carbuncle and furuncle 07/05/2014   BP (high blood pressure) 07/05/2014   Does not feel right 07/05/2014   Pain of perianal area 07/05/2014   Guttate psoriasis 07/05/2014   Gonalgia 07/05/2014   H/O diabetes mellitus 06/05/2014   CAFL (chronic airflow limitation) ([place]) 06/05/2014   H/O visual disturbance 06/05/2014   H/O elevated lipids 06/05/2014   H/O: HTN (hypertension) 06/05/2014   H/O: osteoarthritis 06/05/2014   Initial insomnia 06/05/2014   Anxiety, generalized 06/05/2014   Depression, major, recurrent, moderate ([place]) 06/05/2014   Microalbuminuria 12/23/2013   Long term current use of insulin ([place]) 12/23/2013   Compulsive tobacco user syndrome 12/23/2013   Type 2 diabetes mellitus with other diabetic kidney complication ([place]) 83/38/2505   Type II diabetes mellitus with renal manifestations, uncontrolled ([place]) 12/23/2013   Contusion of cheek 03/31/2009   Change in blood platelet count 06/08/2007   Blood in feces 06/01/2007   D (diarrhea) 06/01/2007   Disturbance of skin sensation 09/02/2006   Difficulty hearing 07/25/2006   Acute onset aura migraine 06/27/2006   Cephalalgia 05/27/2006   Clinical depression 10/22/2005   Diabetes mellitus type 2, uncontrolled ([place]) 10/22/2005   HLD (hyperlipidemia) 10/22/2005   Arthritis, degenerative 10/22/2005   Current tobacco use 10/22/2005   Past Medical  History:  Diagnosis Date   Anxiety    Cardiomyopathy [facility])     new to her Jan 2017   COPD (chronic obstructive pulmonary disease) ([place])    Depression    Diabetes mellitus, type II ([place])    HTN (hypertension)    Obesity    Osteoporosis    PONV (postoperative nausea and vomiting)    Stroke [facility])    Jan 2017   Family History  Problem Relation Age of Onset   Heart disease Mother        died from CHF   Asthma Mother    Diabetes Mother    Heart disease Father    Aneurysm Father    COPD Brother    Diabetes Brother    Alcohol abuse Paternal Aunt    Anemia Neg Hx    Arrhythmia Neg Hx    Clotting disorder Neg Hx    Fainting Neg Hx    Heart attack Neg Hx    Heart failure Neg Hx    Hyperlipidemia Neg Hx    Hypertension Neg Hx     Social History   Tobacco Use   Smoking status: Current Every Day Smoker    Packs/day: 1.00    Years: 36.00    Pack years: 36.00    Types: Cigarettes    Start date: 02/08/1974   Smokeless tobacco: Never Used   Tobacco comment: "I can not quit smoking, I have tried". 1 PPD  Vaping Use   Vaping Use: Never used  Substance Use Topics   Alcohol use: No    Alcohol/week: 0.0 standard drinks   Drug use: No   Allergies  Allergen Reactions   Codeine Nausea And Vomiting     Medications: Outpatient Medications Prior to Visit  Medication Sig   aspirin EC 81 MG tablet Take 81 mg by mouth daily. Swallow whole.   atorvastatin (LIPITOR) 40 MG tablet TAKE 1 TABLET BY MOUTH EVERY DAY   B-D INS SYRINGE 0.5CC/31GX5/16 31G X 5/16" 0.5 ML MISC    denosumab (PROLIA) 60 MG/ML SOSY injection Inject 60 mg into the skin every 6 (six) months.   Dulaglutide (TRULICITY) 1.5 BJ/6.2GB SOPN Inject 1.5 mg into the skin every 7 (seven) days. (Monday Nights)   glucose blood test strip OneTouch Verio strips   glyBURIDE-metformin (GLUCOVANCE) 5-500 MG per tablet Take 2 tablets by mouth 2 (two) times daily.    hydrochlorothiazide (HYDRODIURIL) 25 MG tablet Take 25 mg by mouth daily.   Insulin Human (INSULIN PUMP) SOLN Per endocrinoloy    loratadine (CLARITIN) 10 MG tablet Take 1 tablet by mouth daily.   losartan (COZAAR) 25 MG tablet TAKE 2 TABLETS BY MOUTH EVERY DAY   PARoxetine (PAXIL) 40 MG tablet Take 1 tablet (40 mg total) by mouth daily.   traMADol (ULTRAM) 50 MG tablet Take 1 tablet (50 mg total) by mouth 2 (two) times daily as needed for severe pain. Month last 30 days.   Vitamin D, Ergocalciferol, (DRISDOL) 1.25 MG (50000 UT) CAPS capsule Take 50,000 Units by mouth once a week. (Sundays)   apixaban (ELIQUIS) 2.5 MG TABS tablet Take 1 tablet (2.5 mg total) by mouth 2 (two) times daily.   gabapentin (NEURONTIN) 300 MG capsule Take 1 capsule (300 mg total) by mouth daily. (Patient taking differently: Take 300 mg by mouth 2 (two) times daily.)   mupirocin ointment (BACTROBAN) 2 % mupirocin 2 % topical ointment   No facility-administered medications prior to visit.    Review of Systems  Constitutional: Negative.   Respiratory: Negative.   Cardiovascular: Negative.   Gastrointestinal: Negative.   Allergic/Immunologic: Positive for environmental allergies.  Neurological: Negative for light-headedness and headaches. Dizziness: Chronic issue since she had a stroke.      Objective    BP 140/85 (BP Location: Left Arm, Patient Position: Sitting, Cuff Size: Large)   Pulse 84   Temp 97.8 F (36.6 C) (Oral)   Wt 233 lb (105.7 kg)   BMI 37.61 kg/m     Physical Exam Constitutional:      General: She is not in acute distress.    Appearance: She is well-developed.  HENT:     Head: Normocephalic and atraumatic.     Right Ear: Hearing normal.     Left Ear: Hearing normal.     Nose: Nose normal.  Eyes:     General: Lids are normal. No scleral icterus.       Right eye: No discharge.        Left eye: No discharge.     Conjunctiva/sclera: Conjunctivae normal.  Cardiovascular:     Rate and Rhythm: Normal rate and regular rhythm.     Pulses: Normal pulses.     Heart sounds: Normal heart sounds.  Pulmonary:     Effort:  Pulmonary effort is normal. No respiratory distress.     Breath sounds: Normal breath sounds.  Abdominal:     General: Bowel sounds are normal.     Palpations: Abdomen is soft.  Musculoskeletal:        General: Normal range of motion.     Cervical back: Neck supple.  Skin:    Findings: No lesion or rash.  Neurological:     Mental Status: She is alert and oriented to person, place, and time.  Psychiatric:        Speech: Speech normal.        Behavior: Behavior normal.        Thought Content: Thought content normal.      No results found for any visits on 07/10/20.  Assessment & Plan     1. Type 2 diabetes mellitus with hyperosmolar hyperglycemic state (HHS) ([place]) Hospitalized 05-31-20 through 06-01-20 with blood sugar over 600 on admission. Had been on an insulin pump per endocrinologist (Dr. Gabriel Carina) with glucovance and Trulicity. Recheck labs and follow up with endocrinologist. - CBC with Differential/Platelet - Comprehensive metabolic panel - Lipid panel  2. CKD (chronic kidney disease), stage IV (HCC) Creatinine 2.49 with BUN 44 on admission. After stabilization, creatinine 1.77 and BUN 38. Feeling better and will recheck labs. - CBC with Differential/Platelet - Comprehensive metabolic panel  3. Essential hypertension BP better now. Back on home medications of Losartan 50 mg qd and HCTZ 25 mg qd. Recheck labs and follow up pending reports. - CBC with Differential/Platelet - Comprehensive metabolic panel - Lipid panel  4. Hyperlipidemia, unspecified hyperlipidemia type Tolerating Atorvastatin 40 mg qd without side effects. Continue to follow low fat diabetic diet and recheck labs. - CBC with Differential/Platelet - Comprehensive metabolic panel - Lipid panel   No follow-ups on file.      I, Shanah Guimaraes, PA-C, have reviewed all documentation for this visit. The documentation on 08/13/20 for the exam, diagnosis, procedures, and orders are all accurate and  complete.    Vernie Murders, PA-C  [employer] [phone] (phone) [phone] (fax)  [place]

## 2020-07-11 LAB — CBC WITH DIFFERENTIAL/PLATELET
Basophils Absolute: 0.1 10*3/uL (ref 0.0–0.2)
Basos: 1 %
EOS (ABSOLUTE): 0.5 10*3/uL — ABNORMAL HIGH (ref 0.0–0.4)
Eos: 4 %
Hematocrit: 42.7 % (ref 34.0–46.6)
Hemoglobin: 13.3 g/dL (ref 11.1–15.9)
Immature Grans (Abs): 0 10*3/uL (ref 0.0–0.1)
Immature Granulocytes: 0 %
Lymphocytes Absolute: 1.6 10*3/uL (ref 0.7–3.1)
Lymphs: 12 %
MCH: 27.8 pg (ref 26.6–33.0)
MCHC: 31.1 g/dL — ABNORMAL LOW (ref 31.5–35.7)
MCV: 89 fL (ref 79–97)
Monocytes Absolute: 0.6 10*3/uL (ref 0.1–0.9)
Monocytes: 5 %
Neutrophils Absolute: 10.2 10*3/uL — ABNORMAL HIGH (ref 1.4–7.0)
Neutrophils: 78 %
Platelets: 435 10*3/uL (ref 150–450)
RBC: 4.79 x10E6/uL (ref 3.77–5.28)
RDW: 13.8 % (ref 11.7–15.4)
WBC: 13 10*3/uL — ABNORMAL HIGH (ref 3.4–10.8)

## 2020-07-11 LAB — LIPID PANEL
Chol/HDL Ratio: 3.6 ratio (ref 0.0–4.4)
Cholesterol, Total: 142 mg/dL (ref 100–199)
HDL: 39 mg/dL — ABNORMAL LOW (ref >39)
LDL Chol Calc (NIH): 70 mg/dL (ref 0–99)
Triglycerides: 198 mg/dL — ABNORMAL HIGH (ref 0–149)
VLDL Cholesterol Cal: 33 mg/dL (ref 5–40)

## 2020-07-11 LAB — COMPREHENSIVE METABOLIC PANEL
ALT: 13 IU/L (ref 0–32)
AST: 12 IU/L (ref 0–40)
Albumin/Globulin Ratio: 1.5 (ref 1.2–2.2)
Albumin: 3.9 g/dL (ref 3.8–4.8)
Alkaline Phosphatase: 83 IU/L (ref 44–121)
BUN/Creatinine Ratio: 15 (ref 12–28)
BUN: 31 mg/dL — ABNORMAL HIGH (ref 8–27)
Bilirubin Total: 0.3 mg/dL (ref 0.0–1.2)
CO2: 22 mmol/L (ref 20–29)
Calcium: 8.6 mg/dL — ABNORMAL LOW (ref 8.7–10.3)
Chloride: 99 mmol/L (ref 96–106)
Creatinine, Ser: 2.02 mg/dL — ABNORMAL HIGH (ref 0.57–1.00)
Globulin, Total: 2.6 g/dL (ref 1.5–4.5)
Glucose: 380 mg/dL — ABNORMAL HIGH (ref 65–99)
Potassium: 4.8 mmol/L (ref 3.5–5.2)
Sodium: 137 mmol/L (ref 134–144)
Total Protein: 6.5 g/dL (ref 6.0–8.5)
eGFR: 27 mL/min/{1.73_m2} — ABNORMAL LOW (ref >59)

## 2020-07-14 ENCOUNTER — Other Ambulatory Visit: Payer: Self-pay | Admitting: Family Medicine

## 2020-07-14 ENCOUNTER — Other Ambulatory Visit: Payer: Self-pay

## 2020-07-14 DIAGNOSIS — I10 Essential (primary) hypertension: Secondary | ICD-10-CM

## 2020-07-14 DIAGNOSIS — Z794 Long term (current) use of insulin: Secondary | ICD-10-CM

## 2020-07-14 DIAGNOSIS — D72829 Elevated white blood cell count, unspecified: Secondary | ICD-10-CM

## 2020-07-14 DIAGNOSIS — E785 Hyperlipidemia, unspecified: Secondary | ICD-10-CM

## 2020-07-14 DIAGNOSIS — E1143 Type 2 diabetes mellitus with diabetic autonomic (poly)neuropathy: Secondary | ICD-10-CM

## 2020-07-14 NOTE — Telephone Encounter (Signed)
Requested Prescriptions  Pending Prescriptions Disp Refills  . losartan (COZAAR) 25 MG tablet [Pharmacy Med Name: LOSARTAN POTASSIUM 25 MG TAB] 30 tablet 0    Sig: TAKE 2 TABLETS BY MOUTH EVERY DAY     Cardiovascular:  Angiotensin Receptor Blockers Failed - 07/14/2020  2:13 PM      Failed - Cr in normal range and within 180 days    Creat  Date Value Ref Range Status  03/21/2015 1.37 (H) 0.50 - 0.99 mg/dL Final   Creatinine, Ser  Date Value Ref Range Status  07/10/2020 2.02 (H) 0.57 - 1.00 mg/dL Final         Failed - Last BP in normal range    BP Readings from Last 1 Encounters:  07/10/20 140/85         Passed - K in normal range and within 180 days    Potassium  Date Value Ref Range Status  07/10/2020 4.8 3.5 - 5.2 mmol/L Final         Passed - Patient is not pregnant      Passed - Valid encounter within last 6 months    Recent Outpatient Visits          4 days ago Type 2 diabetes mellitus with hyperosmolar hyperglycemic state (HHS) [facility])   [place], Vickki Muff, PA-C   1 year ago Type 2 diabetes mellitus with diabetic autonomic neuropathy, with long-term current use of insulin ([place])   [employer], Vickki Muff, PA-C   2 years ago Impaired balance as late effect of cerebrovascular accident   [employer], Vickki Muff, PA-C   2 years ago Cerebrovascular accident (CVA) due to embolism of anterior cerebral artery, unspecified blood vessel laterality [facility])   [place], Vickki Muff, PA-C   2 years ago Acute gout of right ankle, unspecified cause   [facility], Dionne Bucy, MD

## 2020-07-14 NOTE — Telephone Encounter (Signed)
Requested Prescriptions  Pending Prescriptions Disp Refills  . atorvastatin (LIPITOR) 40 MG tablet [Pharmacy Med Name: ATORVASTATIN 40 MG TABLET] 90 tablet 2    Sig: TAKE 1 TABLET BY MOUTH EVERY DAY     Cardiovascular:  Antilipid - Statins Failed - 07/14/2020  5:18 PM      Failed - HDL in normal range and within 360 days    HDL  Date Value Ref Range Status  07/10/2020 39 (L) >39 mg/dL Final         Failed - Triglycerides in normal range and within 360 days    Triglycerides  Date Value Ref Range Status  07/10/2020 198 (H) 0 - 149 mg/dL Final         Passed - Total Cholesterol in normal range and within 360 days    Cholesterol, Total  Date Value Ref Range Status  07/10/2020 142 100 - 199 mg/dL Final         Passed - LDL in normal range and within 360 days    LDL Chol Calc (NIH)  Date Value Ref Range Status  07/10/2020 70 0 - 99 mg/dL Final         Passed - Patient is not pregnant      Passed - Valid encounter within last 12 months    Recent Outpatient Visits          4 days ago Type 2 diabetes mellitus with hyperosmolar hyperglycemic state (HHS) [facility])   [place], Vickki Muff, PA-C   1 year ago Type 2 diabetes mellitus with diabetic autonomic neuropathy, with long-term current use of insulin ([place])   [employer], Vickki Muff, PA-C   2 years ago Impaired balance as late effect of cerebrovascular accident   [employer] [facility], Vickki Muff, PA-C   2 years ago Cerebrovascular accident (CVA) due to embolism of anterior cerebral artery, unspecified blood vessel laterality [facility])   [place], Vickki Muff, PA-C   2 years ago Acute gout of right ankle, unspecified cause   [facility], Dionne Bucy, MD

## 2020-07-15 DIAGNOSIS — H43811 Vitreous degeneration, right eye: Secondary | ICD-10-CM | POA: Diagnosis not present

## 2020-07-15 DIAGNOSIS — E113513 Type 2 diabetes mellitus with proliferative diabetic retinopathy with macular edema, bilateral: Secondary | ICD-10-CM | POA: Diagnosis not present

## 2020-07-15 DIAGNOSIS — H4311 Vitreous hemorrhage, right eye: Secondary | ICD-10-CM | POA: Diagnosis not present

## 2020-07-15 DIAGNOSIS — H43823 Vitreomacular adhesion, bilateral: Secondary | ICD-10-CM | POA: Diagnosis not present

## 2020-07-15 LAB — HM DIABETES EYE EXAM

## 2020-07-22 ENCOUNTER — Other Ambulatory Visit: Payer: Self-pay | Admitting: Family Medicine

## 2020-07-22 DIAGNOSIS — Z794 Long term (current) use of insulin: Secondary | ICD-10-CM

## 2020-07-22 DIAGNOSIS — I10 Essential (primary) hypertension: Secondary | ICD-10-CM

## 2020-07-22 DIAGNOSIS — E1143 Type 2 diabetes mellitus with diabetic autonomic (poly)neuropathy: Secondary | ICD-10-CM

## 2020-07-31 ENCOUNTER — Ambulatory Visit (INDEPENDENT_AMBULATORY_CARE_PROVIDER_SITE_OTHER): Payer: Medicare Other

## 2020-07-31 DIAGNOSIS — Z794 Long term (current) use of insulin: Secondary | ICD-10-CM | POA: Diagnosis not present

## 2020-07-31 DIAGNOSIS — E1143 Type 2 diabetes mellitus with diabetic autonomic (poly)neuropathy: Secondary | ICD-10-CM

## 2020-07-31 NOTE — Chronic Care Management (AMB) (Signed)
  Chronic Care Management   Note  07/31/2020 Name: Jocelyn Wells MRN: [id number] DOB: 06-01-1955   Jocelyn Wells is currently enrolled in the Chronic Care Management program. Reports feeling tired today and prefers to complete telephonic outreach on a later date. Denies urgent concerns or care management needs. Agreeable to follow up in two weeks.    Follow up plan: Will follow up in two weeks.    [place] [facility] [phone]

## 2020-08-08 ENCOUNTER — Telehealth: Payer: Medicare Other | Admitting: Psychiatry

## 2020-08-12 DIAGNOSIS — E113511 Type 2 diabetes mellitus with proliferative diabetic retinopathy with macular edema, right eye: Secondary | ICD-10-CM | POA: Diagnosis not present

## 2020-08-15 ENCOUNTER — Ambulatory Visit (INDEPENDENT_AMBULATORY_CARE_PROVIDER_SITE_OTHER): Payer: Medicare Other

## 2020-08-15 DIAGNOSIS — I5022 Chronic systolic (congestive) heart failure: Secondary | ICD-10-CM

## 2020-08-15 DIAGNOSIS — E1143 Type 2 diabetes mellitus with diabetic autonomic (poly)neuropathy: Secondary | ICD-10-CM | POA: Diagnosis not present

## 2020-08-15 DIAGNOSIS — Z9181 History of falling: Secondary | ICD-10-CM

## 2020-08-15 DIAGNOSIS — Z794 Long term (current) use of insulin: Secondary | ICD-10-CM

## 2020-08-15 DIAGNOSIS — J449 Chronic obstructive pulmonary disease, unspecified: Secondary | ICD-10-CM

## 2020-08-15 NOTE — Chronic Care Management (AMB) (Signed)
Chronic Care Management   Follow Up Note   08/15/2020 Name: Jocelyn Wells MRN: [id number] DOB: April 10, 1955  Primary Care Provider: Margo Common, PA-C Reason for referral : Chronic Care Management   Jocelyn Wells is a 65 y.o. year old female who is a primary care patient of [place], Vickki Muff, PA-C. A telephonic outreach was conducted today.  Review of Jocelyn Wells' status, including review of consultants reports, relevant labs and test results was conducted today. Collaboration with appropriate care team members was performed as part of the comprehensive evaluation and provision of chronic care management services.     SDOH (Social Determinants of Health) assessments performed: No     Outpatient Encounter Medications as of 08/15/2020  Medication Sig Note   apixaban (ELIQUIS) 2.5 MG TABS tablet Take 1 tablet (2.5 mg total) by mouth 2 (two) times daily.    aspirin EC 81 MG tablet Take 81 mg by mouth daily. Swallow whole.    atorvastatin (LIPITOR) 40 MG tablet TAKE 1 TABLET BY MOUTH EVERY DAY    B-D INS SYRINGE 0.5CC/31GX5/16 31G X 5/16" 0.5 ML MISC     denosumab (PROLIA) 60 MG/ML SOSY injection Inject 60 mg into the skin every 6 (six) months.    Dulaglutide (TRULICITY) 1.5 WU/9.8JX SOPN Inject 1.5 mg into the skin every 7 (seven) days. (Monday Nights)    gabapentin (NEURONTIN) 300 MG capsule Take 1 capsule (300 mg total) by mouth daily. (Patient taking differently: Take 300 mg by mouth 2 (two) times daily.) 08/15/2020: Reports taking one capsule every night and being informed to take an additional capsule in the morning if needed.   glucose blood test strip OneTouch Verio strips    glyBURIDE-metformin (GLUCOVANCE) 5-500 MG per tablet Take 2 tablets by mouth 2 (two) times daily.     hydrochlorothiazide (HYDRODIURIL) 25 MG tablet Take 25 mg by mouth daily.    Insulin Human (INSULIN PUMP) SOLN Per endocrinoloy    loratadine (CLARITIN) 10 MG tablet Take 1 tablet by mouth daily.     losartan (COZAAR) 25 MG tablet TAKE 2 TABLETS BY MOUTH EVERY DAY    mupirocin ointment (BACTROBAN) 2 % mupirocin 2 % topical ointment    PARoxetine (PAXIL) 40 MG tablet Take 1 tablet (40 mg total) by mouth daily.    Vitamin D, Ergocalciferol, (DRISDOL) 1.25 MG (50000 UT) CAPS capsule Take 50,000 Units by mouth once a week. (Sundays)    No facility-administered encounter medications on file as of 08/15/2020.     Objective:  Patient Care Plan: COPD (Adult)     Problem Identified: Symptom Exacerbation (COPD)      Long-Range Goal: Symptom Exacerbation Prevented or Minimized Completed 08/15/2020  Start Date: 02/14/2020  Expected End Date: 06/13/2020  Priority: High  Note:   Current Barriers:  Chronic Disease Management needs and support r/t COPD self management   Case Manager Clinical Goal(s): Over the next 120 days, patient will not require hospitalization or emergent care d/t complications r/t COPD exacerbation.  Interventions:  Collaboration with Chrismon, Vickki Muff, PA-C regarding development and update of comprehensive plan of care as evidenced by provider attestation and co-signature Inter-disciplinary care team collaboration (see longitudinal plan of care) Discussed compliance with treatment plan. Reports symptoms have been well controlled. Denies episodes of shortness of breath. Reports following plan as advised and avoiding triggers related to heat and over exertion. Denies changes/decline in activity tolerance. Reviewed worsening s/sx that require immediate medical attention.   Patient Goals/Self-Care Activities:  Take  medications as prescribed Attend medical appointments as scheduled Follow plan for COPD self management and complete daily assessment Contact provider or care management team with new concerns or questions  Goal Met     Patient Care Plan: Diabetes Type 2 (Adult)     Problem Identified: Glycemic Management (Diabetes, Type 2)      Long-Range Goal: Glycemic  Management Optimized   Start Date: 06/10/2020  Expected End Date: 10/08/2020  Priority: High  Note:    Lab Results  Component Value Date   HGBA1C 10.2 (H) 05/31/2020      Current Barriers:  Knowledge Deficits related to basic Diabetes pathophysiology and self care/management  Case Manager Clinical Goal(s):  Over the next 120 days, patient will demonstrate improved adherence to prescribed treatment plan for diabetes self care/management as evidenced by daily monitoring and recording of CBG, adhering to recommended ADA/ carb modified diet, and adhering to prescribed medication regimen. Lower A1C by at least 1 point.  Interventions:  Collaboration with Chrism[place], Vickki Muff, PA-C regarding development and update of comprehensive plan of care as evidenced by provider attestation and co-signature Inter-disciplinary care team collaboration (see longitudinal plan of care) Reviewed medications and compliance with current treatment plan. Reports taking medications as prescribed. Reviewed s/sx of hyperglycemia and hypoglycemia along with appropriate interventions. Reports most glucose readings have been within range. She recalls an elevated reading over 300 following a meal within the last few weeks. Recalls one low reading in the mid 50's last week. Reports using glucose tablets and level returned to normal range. Denies concerns or complications with her insulin pump. Fasting level today was 94 mg/dl.  Reviewed available Diabetes education and nutrition classes. Reports the Diabetic Coordinator remains available if needed. Reviewed s/sx and indications for seeking immediate medical attention.    Patient Goals/Self-Care Activities -Self administer oral medications and insulin as prescribed -Complete provider appointments as scheduled -Monitor blood glucose levels consistently and utilize recommended interventions -Rotate insulin pump site and use areas with a reasonable "fat pad" as  recommended -Adhere to prescribed ADA/carb modified -Notify provider or care management team with health related questions and concern as needed   Follow Up Plan:  Will follow up next month     Patient Care Plan: Fall Risk (Adult)     Problem Identified: Fall Risk      Long-Range Goal: Absence of Fall and Fall-Related Injury Completed 08/15/2020  Start Date: 02/14/2020  Expected End Date: 06/13/2020  Priority: High  Note:   Current Barriers:  Moderate Risk for Falls  Clinical Goal(s):  Over the next 120 days patient will not require hospitalization or emergent care for complications r/t falls.  Interventions:  Collaboration with Chrism[place], Vickki Muff, PA-C regarding development and update of comprehensive plan of care as evidenced by provider attestation and co-signature Inter-disciplinary care team collaboration (see longitudinal plan of care) Reviewed safety and fall prevention measures. Denies recent falls. Encouraged to continue using an assistive device when ambulating. Continue to use her electric scooter when away from the home. Family remains available to assist with in-home needs. Encouraged to update the care management team if her needs change and additional assistance is required.   Patient Goals/Self-Care Activities -Utilize appropriate assistive device with all ambulation -Change positions slowly and allow time to adjust when standing -Wear secure non skid footwear at all times when ambulating -Keep pathways clear and well lit -Call if needs change and additional assistance is required  Goal Met       PLAN  A member of the care management team will follow up next month.   [facility] [phone]

## 2020-08-20 NOTE — Patient Instructions (Addendum)
Thank you for allowing the Chronic Care Management team to participate in your care.  It was a pleasure speaking with you. Please feel free to contact me with questions.  Goals Addressed:  Patient Care Plan: COPD (Adult)     Problem Identified: Symptom Exacerbation (COPD)      Long-Range Goal: Symptom Exacerbation Prevented or Minimized Completed 08/15/2020  Start Date: 02/14/2020  Expected End Date: 06/13/2020  Priority: High  Note:   Current Barriers:  Chronic Disease Management needs and support r/t COPD self management   Case Manager Clinical Goal(s): Over the next 120 days, patient will not require hospitalization or emergent care d/t complications r/t COPD exacerbation.  Interventions:  Collaboration with Chrism[place], Vickki Muff, PA-C regarding development and update of comprehensive plan of care as evidenced by provider attestation and co-signature Inter-disciplinary care team collaboration (see longitudinal plan of care) Discussed compliance with treatment plan. Reports symptoms have been well controlled. Denies episodes of shortness of breath. Reports following plan as advised and avoiding triggers related to heat and over exertion. Denies changes/decline in activity tolerance. Reviewed worsening s/sx that require immediate medical attention.   Patient Goals/Self-Care Activities:  Take medications as prescribed Attend medical appointments as scheduled Follow plan for COPD self management and complete daily assessment Contact provider or care management team with new concerns or questions  Goal Met     Patient Care Plan: Diabetes Type 2 (Adult)     Problem Identified: Glycemic Management (Diabetes, Type 2)      Long-Range Goal: Glycemic Management Optimized   Start Date: 06/10/2020  Expected End Date: 10/08/2020  Priority: High  Note:    Lab Results  Component Value Date   HGBA1C 10.2 (H) 05/31/2020      Current Barriers:  Knowledge Deficits related to basic  Diabetes pathophysiology and self care/management  Case Manager Clinical Goal(s):  Over the next 120 days, patient will demonstrate improved adherence to prescribed treatment plan for diabetes self care/management as evidenced by daily monitoring and recording of CBG, adhering to recommended ADA/ carb modified diet, and adhering to prescribed medication regimen. Lower A1C by at least 1 point.  Interventions:  Collaboration with Chrism[place], Vickki Muff, PA-C regarding development and update of comprehensive plan of care as evidenced by provider attestation and co-signature Inter-disciplinary care team collaboration (see longitudinal plan of care) Reviewed medications and compliance with current treatment plan. Reports taking medications as prescribed. Reviewed s/sx of hyperglycemia and hypoglycemia along with appropriate interventions. Reports most glucose readings have been within range. She recalls an elevated reading over 300 following a meal within the last few weeks. Recalls one low reading in the mid 50's last week. Reports using glucose tablets and level returned to normal range. Denies concerns or complications with her insulin pump. Fasting level today was 94 mg/dl.  Reviewed available Diabetes education and nutrition classes. Reports the Diabetic Coordinator remains available if needed. Reviewed s/sx and indications for seeking immediate medical attention.    Patient Goals/Self-Care Activities -Self administer oral medications and insulin as prescribed -Complete provider appointments as scheduled -Monitor blood glucose levels consistently and utilize recommended interventions -Rotate insulin pump site and use areas with a reasonable "fat pad" as recommended -Adhere to prescribed ADA/carb modified -Notify provider or care management team with health related questions and concern as needed   Follow Up Plan:  Will follow up next month     Patient Care Plan: Fall Risk (Adult)      Problem Identified: Fall Risk  Long-Range Goal: Absence of Fall and Fall-Related Injury Completed 08/15/2020  Start Date: 02/14/2020  Expected End Date: 06/13/2020  Priority: High  Note:   Current Barriers:  Moderate Risk for Falls  Clinical Goal(s):  Over the next 120 days patient will not require hospitalization or emergent care for complications r/t falls.  Interventions:  Collaboration with Chrism[place], Vickki Muff, PA-C regarding development and update of comprehensive plan of care as evidenced by provider attestation and co-signature Inter-disciplinary care team collaboration (see longitudinal plan of care) Reviewed safety and fall prevention measures. Denies recent falls. Encouraged to continue using an assistive device when ambulating. Continue to use her electric scooter when away from the home. Family remains available to assist with in-home needs. Encouraged to update the care management team if her needs change and additional assistance is required.   Patient Goals/Self-Care Activities -Utilize appropriate assistive device with all ambulation -Change positions slowly and allow time to adjust when standing -Wear secure non skid footwear at all times when ambulating -Keep pathways clear and well lit -Call if needs change and additional assistance is required  Goal Met       Mrs. Smaltz verbalized understanding of the information discussed during the telephonic outreach. Declined need for mailed/printed instructions. A member of the care management team will follow up next month.   [facility] [phone]
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
## 2020-08-25 ENCOUNTER — Other Ambulatory Visit: Payer: Self-pay

## 2020-08-25 ENCOUNTER — Encounter: Payer: Self-pay | Admitting: Psychiatry

## 2020-08-25 ENCOUNTER — Other Ambulatory Visit: Payer: Self-pay | Admitting: Family Medicine

## 2020-08-25 ENCOUNTER — Ambulatory Visit (INDEPENDENT_AMBULATORY_CARE_PROVIDER_SITE_OTHER): Payer: Medicare Other | Admitting: Psychiatry

## 2020-08-25 VITALS — BP 160/87 | HR 99 | Temp 97.6°F | Wt 236.0 lb

## 2020-08-25 DIAGNOSIS — F4310 Post-traumatic stress disorder, unspecified: Secondary | ICD-10-CM | POA: Diagnosis not present

## 2020-08-25 DIAGNOSIS — F330 Major depressive disorder, recurrent, mild: Secondary | ICD-10-CM

## 2020-08-25 DIAGNOSIS — Z794 Long term (current) use of insulin: Secondary | ICD-10-CM

## 2020-08-25 DIAGNOSIS — I10 Essential (primary) hypertension: Secondary | ICD-10-CM

## 2020-08-25 NOTE — Telephone Encounter (Signed)
Requested medication (s) are due for refill today:  Yes  Requested medication (s) are on the active medication list:   Yes  Future visit scheduled:   No    Last ordered: 07/22/2020 #30, 0 refills  Returned because last seen by [employer].   Her refills are only for #30 at a time.   She was seen a month ago without a follow up appt scheduled so wasn't how sure to refill this for.      Requested Prescriptions  Pending Prescriptions Disp Refills   losartan (COZAAR) 25 MG tablet [Pharmacy Med Name: LOSARTAN POTASSIUM 25 MG TAB] 30 tablet 0    Sig: TAKE 2 TABLETS BY MOUTH EVERY DAY      Cardiovascular:  Angiotensin Receptor Blockers Failed - 08/25/2020 12:22 PM      Failed - Cr in normal range and within 180 days    Creat  Date Value Ref Range Status  03/21/2015 1.37 (H) 0.50 - 0.99 mg/dL Final   Creatinine, Ser  Date Value Ref Range Status  07/10/2020 2.02 (H) 0.57 - 1.00 mg/dL Final          Failed - Last BP in normal range    BP Readings from Last 1 Encounters:  07/10/20 140/85          Passed - K in normal range and within 180 days    Potassium  Date Value Ref Range Status  07/10/2020 4.8 3.5 - 5.2 mmol/L Final          Passed - Patient is not pregnant      Passed - Valid encounter within last 6 months    Recent Outpatient Visits           1 month ago Type 2 diabetes mellitus with hyperosmolar hyperglycemic state (HHS) [facility])   [place], Vickki Muff, PA-C   1 year ago Type 2 diabetes mellitus with diabetic autonomic neuropathy, with long-term current use of insulin ([place])   [employer] [facility], Vickki Muff, PA-C   2 years ago Impaired balance as late effect of cerebrovascular accident   [employer], Vickki Muff, PA-C   2 years ago Cerebrovascular accident (CVA) due to embolism of anterior cerebral artery, unspecified blood vessel laterality [facility])   [place], Vickki Muff, PA-C   2  years ago Acute gout of right ankle, unspecified cause   [facility], Dionne Bucy, MD

## 2020-08-25 NOTE — Patient Instructions (Signed)
Continue Paxil 40 mg daily Next appointment- 10/18 at 9 AM

## 2020-09-02 DIAGNOSIS — Z794 Long term (current) use of insulin: Secondary | ICD-10-CM | POA: Diagnosis not present

## 2020-09-02 DIAGNOSIS — E113393 Type 2 diabetes mellitus with moderate nonproliferative diabetic retinopathy without macular edema, bilateral: Secondary | ICD-10-CM | POA: Diagnosis not present

## 2020-09-02 DIAGNOSIS — E1129 Type 2 diabetes mellitus with other diabetic kidney complication: Secondary | ICD-10-CM | POA: Diagnosis not present

## 2020-09-02 DIAGNOSIS — F17200 Nicotine dependence, unspecified, uncomplicated: Secondary | ICD-10-CM | POA: Diagnosis not present

## 2020-09-02 DIAGNOSIS — E1121 Type 2 diabetes mellitus with diabetic nephropathy: Secondary | ICD-10-CM | POA: Diagnosis not present

## 2020-09-02 DIAGNOSIS — I10 Essential (primary) hypertension: Secondary | ICD-10-CM | POA: Diagnosis not present

## 2020-09-02 DIAGNOSIS — R809 Proteinuria, unspecified: Secondary | ICD-10-CM | POA: Diagnosis not present

## 2020-09-02 DIAGNOSIS — E1159 Type 2 diabetes mellitus with other circulatory complications: Secondary | ICD-10-CM | POA: Diagnosis not present

## 2020-09-02 DIAGNOSIS — Z9641 Presence of insulin pump (external) (internal): Secondary | ICD-10-CM | POA: Diagnosis not present

## 2020-09-02 DIAGNOSIS — E1142 Type 2 diabetes mellitus with diabetic polyneuropathy: Secondary | ICD-10-CM | POA: Diagnosis not present

## 2020-09-02 LAB — HEMOGLOBIN A1C: Hemoglobin A1C: 10.3

## 2020-09-09 ENCOUNTER — Other Ambulatory Visit: Payer: Self-pay | Admitting: Family Medicine

## 2020-09-09 ENCOUNTER — Telehealth: Payer: Self-pay

## 2020-09-09 DIAGNOSIS — E1143 Type 2 diabetes mellitus with diabetic autonomic (poly)neuropathy: Secondary | ICD-10-CM

## 2020-09-09 DIAGNOSIS — I10 Essential (primary) hypertension: Secondary | ICD-10-CM

## 2020-09-09 DIAGNOSIS — Z794 Long term (current) use of insulin: Secondary | ICD-10-CM

## 2020-09-09 NOTE — Telephone Encounter (Signed)
Requested medication (s) are due for refill today: yes  Requested medication (s) are on the active medication list: yes  Last refill:  08/25/20 #30  Future visit scheduled: no  Notes to clinic:  last RF for 15 days. Please review and needs advice if this should be filled. Thank you!   Requested Prescriptions  Pending Prescriptions Disp Refills   losartan (COZAAR) 25 MG tablet [Pharmacy Med Name: LOSARTAN POTASSIUM 25 MG TAB] 30 tablet 0    Sig: TAKE 2 TABLETS BY MOUTH EVERY DAY      Cardiovascular:  Angiotensin Receptor Blockers Failed - 09/09/2020  1:30 PM      Failed - Cr in normal range and within 180 days    Creat  Date Value Ref Range Status  03/21/2015 1.37 (H) 0.50 - 0.99 mg/dL Final   Creatinine, Ser  Date Value Ref Range Status  07/10/2020 2.02 (H) 0.57 - 1.00 mg/dL Final          Failed - Last BP in normal range    BP Readings from Last 1 Encounters:  07/10/20 140/85          Passed - K in normal range and within 180 days    Potassium  Date Value Ref Range Status  07/10/2020 4.8 3.5 - 5.2 mmol/L Final          Passed - Patient is not pregnant      Passed - Valid encounter within last 6 months    Recent Outpatient Visits           2 months ago Type 2 diabetes mellitus with hyperosmolar hyperglycemic state (HHS) [facility])   [place], Vickki Muff, PA-C   1 year ago Type 2 diabetes mellitus with diabetic autonomic neuropathy, with long-term current use of insulin ([place])   [employer], Vickki Muff, PA-C   2 years ago Impaired balance as late effect of cerebrovascular accident   [employer], Vickki Muff, PA-C   2 years ago Cerebrovascular accident (CVA) due to embolism of anterior cerebral artery, unspecified blood vessel laterality [facility])   [place], Vickki Muff, PA-C   2 years ago Acute gout of right ankle, unspecified cause   [facility], Dionne Bucy,  MD

## 2020-09-09 NOTE — Telephone Encounter (Signed)
She will need an appt. She has not been here since 03/2020, and the takes an opioid, so appt is required.

## 2020-09-09 NOTE — Telephone Encounter (Signed)
Pt. States she needs a refill on her medicine.

## 2020-09-24 ENCOUNTER — Telehealth: Payer: Self-pay

## 2020-09-24 NOTE — Telephone Encounter (Signed)
Pt is requesting Refill on Tramadol

## 2020-10-02 ENCOUNTER — Ambulatory Visit
Payer: Medicare Other | Attending: Student in an Organized Health Care Education/Training Program | Admitting: Student in an Organized Health Care Education/Training Program

## 2020-10-02 ENCOUNTER — Other Ambulatory Visit: Payer: Self-pay

## 2020-10-02 ENCOUNTER — Encounter: Payer: Self-pay | Admitting: Student in an Organized Health Care Education/Training Program

## 2020-10-02 VITALS — BP 150/86 | HR 95 | Temp 96.8°F | Resp 16 | Ht 66.0 in | Wt 236.0 lb

## 2020-10-02 DIAGNOSIS — S22000A Wedge compression fracture of unspecified thoracic vertebra, initial encounter for closed fracture: Secondary | ICD-10-CM | POA: Diagnosis not present

## 2020-10-02 DIAGNOSIS — E1165 Type 2 diabetes mellitus with hyperglycemia: Secondary | ICD-10-CM

## 2020-10-02 DIAGNOSIS — M48061 Spinal stenosis, lumbar region without neurogenic claudication: Secondary | ICD-10-CM

## 2020-10-02 DIAGNOSIS — G894 Chronic pain syndrome: Secondary | ICD-10-CM | POA: Diagnosis not present

## 2020-10-02 DIAGNOSIS — IMO0002 Reserved for concepts with insufficient information to code with codable children: Secondary | ICD-10-CM

## 2020-10-02 DIAGNOSIS — S32010S Wedge compression fracture of first lumbar vertebra, sequela: Secondary | ICD-10-CM

## 2020-10-02 DIAGNOSIS — I63033 Cerebral infarction due to thrombosis of bilateral carotid arteries: Secondary | ICD-10-CM | POA: Diagnosis not present

## 2020-10-02 DIAGNOSIS — F17200 Nicotine dependence, unspecified, uncomplicated: Secondary | ICD-10-CM | POA: Diagnosis not present

## 2020-10-02 DIAGNOSIS — E1129 Type 2 diabetes mellitus with other diabetic kidney complication: Secondary | ICD-10-CM

## 2020-10-02 DIAGNOSIS — M48062 Spinal stenosis, lumbar region with neurogenic claudication: Secondary | ICD-10-CM | POA: Diagnosis not present

## 2020-10-02 DIAGNOSIS — F17203 Nicotine dependence unspecified, with withdrawal: Secondary | ICD-10-CM

## 2020-10-02 MED ORDER — TRAMADOL HCL 50 MG PO TABS: 50.0000 mg | ORAL_TABLET | Freq: Two times a day (BID) | ORAL | 2 refills | Status: AC | PRN

## 2020-10-02 NOTE — Progress Notes (Signed)
PROVIDER NOTE: Information contained herein reflects review and annotations entered in association with encounter. Interpretation of such information and data should be left to medically-trained personnel. Information provided to patient can be located elsewhere in the medical record under "Patient Instructions". Document created using STT-dictation technology, any transcriptional errors that may result from process are unintentional.    Patient: Jocelyn Wells  Service Category: E/M  Provider: Gillis Santa, MD  DOB: Jun 23, 1955  DOS: 10/02/2020  Specialty: Interventional Pain Management  MRN: [id number]  Setting: Ambulatory outpatient  PCP: Jocelyn Common, PA-C  Type: Established Patient    Referring Provider: Margo Common, PA-C  Location: Office  Delivery: Face-to-face     HPI  [name], a 65 y.o. year old female, is here today because of her Lumbar foraminal stenosis [[name]]. [name] primary complain today is Back Pain (low) Last encounter: My last encounter with her was on 01/29/21 Pertinent problems: [name] has H/O: osteoarthritis; Anxiety, generalized; Depression, major, recurrent, moderate ([place]); Diabetes mellitus type 2, uncontrolled ([place]); Arthritis, degenerative; Morbid obesity due to excess calories ([place]); Cerebrovascular accident (CVA) due to thrombosis of left middle cerebral artery ([place]); Cerebrovascular accident (CVA) due to bilateral thrombosis of carotid arteries ([place]); Type II diabetes mellitus with renal manifestations, uncontrolled ([place]); Compression fracture of L1 lumbar vertebra ([place]); Compression fracture of body of thoracic vertebra ([place]); Lumbar foraminal stenosis (RIGHT L1); Spinal stenosis of lumbar region with neurogenic claudication (L4-L5); and MDD (major depressive disorder), recurrent, in full remission ([place]) on their pertinent problem list. Pain Assessment: Severity of Chronic pain is reported as a 7 /10. Location: Back Lower/radiates down  both legs to ankles. Onset: More than a month ago. Quality: Aching, Sharp, Stabbing, Shooting, Throbbing, Pins and needles, Spasm (the pain is different). Timing: Constant. Modifying factor(s): sitting, medications. Vitals:  height is 5' 6"  (1.676 m) and weight is 236 lb (107 kg). Her temporal temperature is 96.8 F (36 C) (abnormal). Her blood pressure is 150/86 (abnormal) and her pulse is 95. Her respiration is 16 and oxygen saturation is 92%.   Reason for encounter: medication management.    He presents today for medication management.  No major change in her history. Chronic pain at baseline.  She continues to smoke a pack per day.  She has discontinued her apixaban as her insurance would not cover it.  She is currently on 81 mg of aspirin.  We discussed how smoking can impact and amplify her chronic pain as well as decreased perfusion to various muscle beds that are important for movement. She is fairly sedentary so we discussed how treatment is stationary foot pedal can be helpful with perfusion to lower extremities.   Otherwise she continues her tramadol as prescribed, 50 mg twice daily.  She has a history of CVA and a T12-L1 compression fracture with a T7-T8 disc herniation with some spinal cord compression but no clinical myelopathy.  Will renew annual urine toxicology screen today  Pharmacotherapy Assessment   Analgesic: Tramadol 50 mg twice daily as needed, quantity 60/month    Monitoring: [place] PMP: PDMP reviewed during this encounter.       Pharmacotherapy: No side-effects or adverse reactions reported. Compliance: No problems identified. Effectiveness: Clinically acceptable.  Dewayne Shorter, RN  10/02/2020  9:34 AM  Sign when Signing Visit Nursing Pain Medication Assessment:  Safety precautions to be maintained throughout the outpatient stay will include: orient to surroundings, keep bed in low position, maintain call bell within reach at all  times, provide assistance with transfer out  of bed and ambulation.  Medication Inspection Compliance: Pill count conducted under aseptic conditions, in front of the patient. Neither the pills nor the bottle was removed from the patient's sight at any time. Once count was completed pills were immediately returned to the patient in their original bottle.  Medication: Tramadol (Ultram) Pill/Patch Count:  13 of 60 pills remain Pill/Patch Appearance: Markings consistent with prescribed medication Bottle Appearance: Standard pharmacy container. Clearly labeled. Filled Date: 07 / 07 / 2022 Last Medication intake:  Yesterday   UDS:  Summary  Date Value Ref Range Status  04/17/2019 Note  Final    Comment:    ==================================================================== Compliance Drug Analysis, Ur ==================================================================== Test                             Result       Flag       Units Drug Present   Tramadol                       >6410                   ng/mg creat   O-Desmethyltramadol            3904                    ng/mg creat   N-Desmethyltramadol            3140                    ng/mg creat    Source of tramadol is a prescription medication. O-desmethyltramadol    and N-desmethyltramadol are expected metabolites of tramadol.   Gabapentin                     PRESENT   Paroxetine                     PRESENT ==================================================================== Test                      Result    Flag   Units      Ref Range   Creatinine              78               mg/dL      >=20 ==================================================================== Declared Medications:  Medication list was not provided. ==================================================================== For clinical consultation, please call [phone]. ====================================================================      ROS  Constitutional: Denies any fever or  chills Gastrointestinal: No reported hemesis, hematochezia, vomiting, or acute GI distress Musculoskeletal:  Mid back, low back pain Neurological: No reported episodes of acute onset apraxia, aphasia, dysarthria, agnosia, amnesia, paralysis, loss of coordination, or loss of consciousness  Medication Review  Dulaglutide, Insulin Syringe-Needle U-100, PARoxetine, Vitamin D (Ergocalciferol), aspirin EC, atorvastatin, denosumab, gabapentin, glucose blood, glyBURIDE-metformin, hydrochlorothiazide, insulin pump, loratadine, losartan, mupirocin ointment, and traMADol  History Review  Allergy: Ms. Perrault is allergic to codeine. Drug: Ms. Fullam  reports no history of drug use. Alcohol:  reports no history of alcohol use. Tobacco:  reports that she has been smoking cigarettes. She started smoking about 46 years ago. She has a 36.00 pack-year smoking history. She has never used smokeless tobacco. Social: Ms. Dechellis  reports that she has been smoking cigarettes. She  started smoking about 46 years ago. She has a 36.00 pack-year smoking history. She has never used smokeless tobacco. She reports that she does not drink alcohol and does not use drugs. Medical:  has a past medical history of Anxiety, Cardiomyopathy ([place]), COPD (chronic obstructive pulmonary disease) ([place]), Depression, Diabetes mellitus, type II ([place]), HTN (hypertension), Obesity, Osteoporosis, PONV (postoperative nausea and vomiting), and Stroke ([place]). Surgical: Ms. Cuadras  has a past surgical history that includes Abdominal hysterectomy; Tubal ligation (1978); Cesarean section (1978); Knee arthroscopy (Left, 2005); Ulnar nerve transposition (2008); Bilateral salpingoophorectomy (2000); Ankle fracture surgery (Left, 2002); TEE without cardioversion (N/A, 01/31/2015); Cardiac catheterization (N/A, 03/25/2015); Cardiac catheterization (N/A, 08/05/2015); Esophagogastroduodenoscopy (egd) with propofol (N/A, 09/22/2016); Colonoscopy with propofol (N/A,  09/22/2016); and Esophagogastroduodenoscopy (egd) with propofol (N/A, 12/08/2016). Family: family history includes Alcohol abuse in her paternal aunt; Aneurysm in her father; Asthma in her mother; COPD in her brother; Diabetes in her brother and mother; Heart disease in her father and mother.  Laboratory Chemistry Profile   Renal Lab Results  Component Value Date   BUN 31 (H) 07/10/2020   CREATININE 2.02 (H) 07/10/2020   BCR 15 07/10/2020   GFRAA 33 (L) 04/23/2019   GFRNONAA 31 (L) 06/01/2020     Hepatic Lab Results  Component Value Date   AST 12 07/10/2020   ALT 13 07/10/2020   ALBUMIN 3.9 07/10/2020   ALKPHOS 83 07/10/2020     Electrolytes Lab Results  Component Value Date   NA 137 07/10/2020   K 4.8 07/10/2020   CL 99 07/10/2020   CALCIUM 8.6 (L) 07/10/2020     Bone No results found for: VD25OH, UX833XO3ANV, BT6606YO4, HT9774FS2, 25OHVITD1, 25OHVITD2, 25OHVITD3, TESTOFREE, TESTOSTERONE   Inflammation (CRP: Acute Phase) (ESR: Chronic Phase) Lab Results  Component Value Date   LATICACIDVEN 1.4 05/31/2020       Note: Above Lab results reviewed.  Recent Imaging Review  DG Chest Portable 1 View CLINICAL DATA:  Hypoxemia. High blood sugar. History of COPD. Nausea and vomiting.  EXAM: PORTABLE CHEST 1 VIEW  COMPARISON:  04/19/2017  FINDINGS: Shallow inspiration. Heart size and pulmonary vascularity are normal for inspiratory effort. No airspace disease or consolidation in the lungs. No pleural effusions. No pneumothorax. Mediastinal contours appear intact. A loop recorder is present. Calcified and tortuous aorta.  IMPRESSION: No active disease.  Electronically Signed   By: Lucienne Capers M.D.   On: 05/31/2020 03:17 Note: Reviewed        Physical Exam  General appearance: Well nourished, well developed, and well hydrated. In no apparent acute distress Mental status: Alert, oriented x 3 (person, place, & time)       Respiratory: No evidence of acute  respiratory distress Eyes: PERLA Vitals: BP (!) 150/86 (BP Location: Right Arm, Patient Position: Sitting, Cuff Size: Large)   Pulse 95   Temp (!) 96.8 F (36 C) (Temporal)   Resp 16   Ht 5' 6"  (1.676 m)   Wt 236 lb (107 kg)   SpO2 92%   BMI 38.09 kg/m  BMI: Estimated body mass index is 38.09 kg/m as calculated from the following:   Height as of this encounter: 5' 6"  (1.676 m).   Weight as of this encounter: 236 lb (107 kg). Ideal: Ideal body weight: 59.3 kg (130 lb 11.7 oz) Adjusted ideal body weight: 78.4 kg (172 lb 13.4 oz)   Lumbar Spine Area Exam  Skin & Axial Inspection: No masses, redness, or swelling Alignment: Symmetrical Functional ROM: Pain restricted  ROM affecting both sides Stability: No instability detected Muscle Tone/Strength: Functionally intact. No obvious neuro-muscular anomalies detected. Sensory (Neurological): Dermatomal pain pattern and neurogenic Palpation: No palpable anomalies       Provocative Tests: Hyperextension/rotation test: (+) bilaterally for facet joint pain. Lumbar quadrant test (Kemp's test): (+) bilateral for foraminal stenosis   Gait & Posture Assessment  Ambulation: Patient ambulates using a cane Gait: Significantly limited. Dependent on assistive device to ambulate Posture: Difficulty standing up straight, due to pain  Lower Extremity Exam      Side: Right lower extremity   Side: Left lower extremity  Stability: No instability observed           Stability: No instability observed          Skin & Extremity Inspection: Skin color, temperature, and hair growth are WNL. No peripheral edema or cyanosis. No masses, redness, swelling, asymmetry, or associated skin lesions. No contractures.   Skin & Extremity Inspection: Skin color, temperature, and hair growth are WNL. No peripheral edema or cyanosis. No masses, redness, swelling, asymmetry, or associated skin lesions. No contractures.  Functional ROM: Pain restricted ROM for hip and knee  joints           Functional ROM: Pain restricted ROM for hip and knee joints          Muscle Tone/Strength: Functionally intact. No obvious neuro-muscular anomalies detected.   Muscle Tone/Strength: Functionally intact. No obvious neuro-muscular anomalies detected.  Sensory (Neurological): Musculoskeletal pain pattern         Sensory (Neurological): Musculoskeletal pain pattern        DTR: Patellar: deferred today Achilles: deferred today Plantar: deferred today   DTR: Patellar: deferred today Achilles: deferred today Plantar: deferred today  Palpation: No palpable anomalies   Palpation: No palpable anomalies     Assessment   Status Diagnosis  Persistent Persistent Persistent 1. Lumbar foraminal stenosis (RIGHT L1)   2. Spinal stenosis of lumbar region with neurogenic claudication (L4-L5)   3. Compression fracture of body of thoracic vertebra (HCC)   4. Compression fracture of L1 vertebra, sequela   5. Type II diabetes mellitus with renal manifestations, uncontrolled ([place])   6. Chronic pain syndrome   7. Nicotine dependence with withdrawal, unspecified nicotine product type   8. Cerebrovascular accident (CVA) due to bilateral thrombosis of carotid arteries ([place])        Plan of Care   [name] has a current medication list which includes the following long-term medication(s): atorvastatin, gabapentin, glyburide-metformin, hydrochlorothiazide, insulin pump, loratadine, losartan, and paroxetine.  Discussed the importance of optimizing blood glucose management.  Also discussed the importance of smoking cessation in the context of chronic pain. Smoking cessation instruction/counseling given:  counseled patient on the dangers of tobacco use, advised patient to stop smoking, and reviewed strategies to maximize success  Pharmacotherapy (Medications Ordered): Meds ordered this encounter  Medications   traMADol (ULTRAM) 50 MG tablet    Sig: Take 1 tablet (50 mg total) by  mouth every 12 (twelve) hours as needed for severe pain.    Dispense:  60 tablet    Refill:  2    Follow-up plan:   Return for patient will call to schedule her F2F med refill.   Recent Visits No visits were found meeting these conditions. Showing recent visits within past 90 days and meeting all other requirements Today's Visits Date Type Provider Dept  10/02/20 Office Visit Gillis Santa, MD [facility]  Showing today's visits  and meeting all other requirements Future Appointments No visits were found meeting these conditions. Showing future appointments within next 90 days and meeting all other requirements I discussed the assessment and treatment plan with the patient. The patient was provided an opportunity to ask questions and all were answered. The patient agreed with the plan and demonstrated an understanding of the instructions.  Patient advised to call back or seek an in-person evaluation if the symptoms or condition worsens.  Duration of encounter: 102mnutes.  Note by: BGillis Santa MD Date: 10/02/2020; Time: 9:52 AM

## 2020-10-02 NOTE — Progress Notes (Signed)
Nursing Pain Medication Assessment:  Safety precautions to be maintained throughout the outpatient stay will include: orient to surroundings, keep bed in low position, maintain call bell within reach at all times, provide assistance with transfer out of bed and ambulation.  Medication Inspection Compliance: Pill count conducted under aseptic conditions, in front of the patient. Neither the pills nor the bottle was removed from the patient's sight at any time. Once count was completed pills were immediately returned to the patient in their original bottle.  Medication: Tramadol (Ultram) Pill/Patch Count:  13 of 60 pills remain Pill/Patch Appearance: Markings consistent with prescribed medication Bottle Appearance: Standard pharmacy container. Clearly labeled. Filled Date: 07 / 07 / 2022 Last Medication intake:  Yesterday

## 2020-10-06 DIAGNOSIS — Z794 Long term (current) use of insulin: Secondary | ICD-10-CM | POA: Diagnosis not present

## 2020-10-06 DIAGNOSIS — E1121 Type 2 diabetes mellitus with diabetic nephropathy: Secondary | ICD-10-CM | POA: Diagnosis not present

## 2020-10-06 DIAGNOSIS — Z9641 Presence of insulin pump (external) (internal): Secondary | ICD-10-CM | POA: Diagnosis not present

## 2020-10-06 DIAGNOSIS — E1159 Type 2 diabetes mellitus with other circulatory complications: Secondary | ICD-10-CM | POA: Diagnosis not present

## 2020-10-06 DIAGNOSIS — F17200 Nicotine dependence, unspecified, uncomplicated: Secondary | ICD-10-CM | POA: Diagnosis not present

## 2020-10-06 DIAGNOSIS — E1142 Type 2 diabetes mellitus with diabetic polyneuropathy: Secondary | ICD-10-CM | POA: Diagnosis not present

## 2020-10-06 DIAGNOSIS — E113393 Type 2 diabetes mellitus with moderate nonproliferative diabetic retinopathy without macular edema, bilateral: Secondary | ICD-10-CM | POA: Diagnosis not present

## 2020-10-06 DIAGNOSIS — E1129 Type 2 diabetes mellitus with other diabetic kidney complication: Secondary | ICD-10-CM | POA: Diagnosis not present

## 2020-10-06 DIAGNOSIS — I10 Essential (primary) hypertension: Secondary | ICD-10-CM | POA: Diagnosis not present

## 2020-10-06 DIAGNOSIS — R809 Proteinuria, unspecified: Secondary | ICD-10-CM | POA: Diagnosis not present

## 2020-10-06 DIAGNOSIS — E559 Vitamin D deficiency, unspecified: Secondary | ICD-10-CM | POA: Diagnosis not present

## 2020-10-14 DIAGNOSIS — I129 Hypertensive chronic kidney disease with stage 1 through stage 4 chronic kidney disease, or unspecified chronic kidney disease: Secondary | ICD-10-CM | POA: Insufficient documentation

## 2020-10-16 DIAGNOSIS — M810 Age-related osteoporosis without current pathological fracture: Secondary | ICD-10-CM | POA: Diagnosis not present

## 2020-10-16 LAB — TOXASSURE SELECT 13 (MW), URINE

## 2020-10-20 ENCOUNTER — Ambulatory Visit: Payer: Self-pay | Admitting: *Deleted

## 2020-10-20 ENCOUNTER — Encounter: Payer: Self-pay | Admitting: Family Medicine

## 2020-10-20 ENCOUNTER — Ambulatory Visit (INDEPENDENT_AMBULATORY_CARE_PROVIDER_SITE_OTHER): Payer: Medicare Other | Admitting: Family Medicine

## 2020-10-20 ENCOUNTER — Other Ambulatory Visit: Payer: Self-pay

## 2020-10-20 VITALS — BP 115/72 | HR 90 | Temp 97.9°F | Ht 66.0 in | Wt 241.7 lb

## 2020-10-20 DIAGNOSIS — E1100 Type 2 diabetes mellitus with hyperosmolarity without nonketotic hyperglycemic-hyperosmolar coma (NKHHC): Secondary | ICD-10-CM | POA: Diagnosis not present

## 2020-10-20 DIAGNOSIS — M79662 Pain in left lower leg: Secondary | ICD-10-CM

## 2020-10-20 DIAGNOSIS — I10 Essential (primary) hypertension: Secondary | ICD-10-CM | POA: Diagnosis not present

## 2020-10-20 DIAGNOSIS — E1165 Type 2 diabetes mellitus with hyperglycemia: Secondary | ICD-10-CM | POA: Diagnosis not present

## 2020-10-20 DIAGNOSIS — E785 Hyperlipidemia, unspecified: Secondary | ICD-10-CM

## 2020-10-20 DIAGNOSIS — I63312 Cerebral infarction due to thrombosis of left middle cerebral artery: Secondary | ICD-10-CM | POA: Diagnosis not present

## 2020-10-20 NOTE — Progress Notes (Signed)
Established patient visit   Patient: Jocelyn Wells   DOB: January 05, 1956   65 y.o. Female  MRN: [id number] Visit Date: 10/20/2020  Today's healthcare provider: Vernie Murders, PA-C   No chief complaint on file.  Subjective    HPI  Patient reports pain on the top of her right foot. Patient had a corn on the outer left area of her foot that she has removed. Patient took it off on Friday. Was okay for the rest of that day. Woke up Saturday with leg hurting and foot in just as much pain "could hardly walk". Only relief was staying off of the foot. Only took prescribed medications nothing further. Foot swollen now with shoes on.  Diabetes Mellitus Type II, follow-up  Lab Results  Component Value Date   HGBA1C 10.2 (H) 05/31/2020   HGBA1C 11.4 03/28/2019   HGBA1C 12.2 (H) 10/06/2017   Last seen for diabetes 3 months ago. (07/10/20)  Management since then includes recheck labs and f/u with endocrinologist She reports good compliance with treatment. She is not having side effects.   Home blood sugar records:  226 before patient came to visit  Episodes of hypoglycemia? No tremble and can't walk, dizziness. Light headedness   Current insulin regiment: insulin human  Most Recent Eye Exam: scheduled for one next week  --------------------------------------------------------------------------------------------------- Hypertension, follow-up  BP Readings from Last 3 Encounters:  10/02/20 (!) 150/86  07/10/20 140/85  06/01/20 (!) 157/76   Wt Readings from Last 3 Encounters:  10/02/20 236 lb (107 kg)  07/10/20 233 lb (105.7 kg)  05/31/20 220 lb (99.8 kg)     She was last seen for hypertension 3 months ago. (07/10/20) BP at that visit was 140/85. Management since that visit includes continue medications (losartan 50 mg qd and HCTZ 25 mg qd). She reports good compliance with treatment. She is not having side effects.  She is not exercising. She is adherent to low salt diet.    Outside blood pressures are .  She does smoke.  Use of agents associated with hypertension: none.   --------------------------------------------------------------------------------------------------- Lipid/Cholesterol, follow-up  Last Lipid Panel: Lab Results  Component Value Date   CHOL 142 07/10/2020   LDLCALC 70 07/10/2020   HDL 39 (L) 07/10/2020   TRIG 198 (H) 07/10/2020    She was last seen for this 3 months ago.  Management since that visit includes continue medication (atorvastatin 40 mg qd) and follow low fat diabetic diet.  She reports good compliance with treatment. She is not having side effects.   Symptoms: No appetite changes No foot ulcerations  No chest pain No chest pressure/discomfort  No dyspnea No orthopnea  No fatigue No lower extremity edema  No palpitations No paroxysmal nocturnal dyspnea  No nausea No numbness or tingling of extremity  No polydipsia No polyuria  No speech difficulty No syncope   She is following a Low Sodium, Diabetic diet. Current exercise: none  Last metabolic panel Lab Results  Component Value Date   GLUCOSE 380 (H) 07/10/2020   NA 137 07/10/2020   K 4.8 07/10/2020   BUN 31 (H) 07/10/2020   CREATININE 2.02 (H) 07/10/2020   GFRNONAA 31 (L) 06/01/2020   GFRAA 33 (L) 04/23/2019   CALCIUM 8.6 (L) 07/10/2020   AST 12 07/10/2020   ALT 13 07/10/2020   The ASCVD Risk score (Arnett DK, et al., 2019) failed to calculate for the following reasons:   The patient has a prior MI or stroke  diagnosis  ---------------------------------------------------------------------------------------------------   Past Medical History:  Diagnosis Date   Anxiety    Cardiomyopathy [facility])    new to her Jan 2017   COPD (chronic obstructive pulmonary disease) ([place])    Depression    Diabetes mellitus, type II ([place])    HTN (hypertension)    Obesity    Osteoporosis    PONV (postoperative nausea and vomiting)    Stroke [facility])    Jan 2017    Past Surgical History:  Procedure Laterality Date   ABDOMINAL HYSTERECTOMY     ANKLE FRACTURE SURGERY Left 2002   BILATERAL SALPINGOOPHORECTOMY  2000   CARDIAC CATHETERIZATION N/A 03/25/2015   Procedure: Right/Left Heart Cath and Coronary Angiography;  Surgeon: Belva Crome, MD;  Location: [place] CV LAB;  Service: Cardiovascular;  Laterality: N/A;   CESAREAN SECTION  1978   COLONOSCOPY WITH PROPOFOL N/A 09/22/2016   Procedure: COLONOSCOPY WITH PROPOFOL;  Surgeon: Manya Silvas, MD;  Location: [facility] ENDOSCOPY;  Service: Endoscopy;  Laterality: N/A;   EP IMPLANTABLE DEVICE N/A 08/05/2015   Procedure: Loop Recorder Insertion;  Surgeon: Deboraha Sprang, MD;  Location: [place] [facility];  Service: Cardiovascular;  Laterality: N/A;   ESOPHAGOGASTRODUODENOSCOPY (EGD) WITH PROPOFOL N/A 09/22/2016   Procedure: ESOPHAGOGASTRODUODENOSCOPY (EGD) WITH PROPOFOL;  Surgeon: Manya Silvas, MD;  Location: [facility] ENDOSCOPY;  Service: Endoscopy;  Laterality: N/A;   ESOPHAGOGASTRODUODENOSCOPY (EGD) WITH PROPOFOL N/A 12/08/2016   Procedure: ESOPHAGOGASTRODUODENOSCOPY (EGD) WITH PROPOFOL;  Surgeon: Manya Silvas, MD;  Location: [facility] ENDOSCOPY;  Service: Endoscopy;  Laterality: N/A;   KNEE ARTHROSCOPY Left 2005   TEE WITHOUT CARDIOVERSION N/A 01/31/2015   Procedure: TRANSESOPHAGEAL ECHOCARDIOGRAM (TEE);  Surgeon: Lelon Perla, MD;  Location: [facility] ENDOSCOPY;  Service: Cardiovascular;  Laterality: N/A;   [place]  2008   Family History  Problem Relation Age of Onset   Heart disease Mother        died from CHF   Asthma Mother    Diabetes Mother    Heart disease Father    Aneurysm Father    COPD Brother    Diabetes Brother    Alcohol abuse Paternal Aunt    Anemia Neg Hx    Arrhythmia Neg Hx    Clotting disorder Neg Hx    Fainting Neg Hx    Heart attack Neg Hx    Heart failure Neg Hx    Hyperlipidemia Neg Hx    Hypertension Neg Hx    Social  History   Tobacco Use   Smoking status: Every Day    Packs/day: 1.00    Years: 36.00    Pack years: 36.00    Types: Cigarettes    Start date: 02/08/1974   Smokeless tobacco: Never   Tobacco comments:    "I can not quit smoking, I have tried". 1 PPD  Vaping Use   Vaping Use: Never used  Substance Use Topics   Alcohol use: No    Alcohol/week: 0.0 standard drinks   Drug use: No   Allergies  Allergen Reactions   Codeine Nausea And Vomiting   Medications: Outpatient Medications Prior to Visit  Medication Sig   aspirin EC 81 MG tablet Take 81 mg by mouth daily. Swallow whole.   atorvastatin (LIPITOR) 40 MG tablet TAKE 1 TABLET BY MOUTH EVERY DAY   B-D INS SYRINGE 0.5CC/31GX5/16 31G X 5/16" 0.5 ML MISC    denosumab (PROLIA) 60 MG/ML SOSY injection Inject 60 mg  into the skin every 6 (six) months.   Dulaglutide (TRULICITY) 1.5 GO/1.1XB SOPN Inject 1.5 mg into the skin every 7 (seven) days. (Monday Nights)   gabapentin (NEURONTIN) 300 MG capsule Take 1 capsule (300 mg total) by mouth daily. (Patient taking differently: Take 300 mg by mouth 2 (two) times daily.)   glucose blood test strip OneTouch Verio strips   glyBURIDE-metformin (GLUCOVANCE) 5-500 MG per tablet Take 2 tablets by mouth 2 (two) times daily.    hydrochlorothiazide (HYDRODIURIL) 25 MG tablet Take 25 mg by mouth daily.   Insulin Human (INSULIN PUMP) SOLN Per endocrinoloy   loratadine (CLARITIN) 10 MG tablet Take 1 tablet by mouth daily.   losartan (COZAAR) 25 MG tablet Take 2 tablets (50 mg total) by mouth daily.   mupirocin ointment (BACTROBAN) 2 % mupirocin 2 % topical ointment   PARoxetine (PAXIL) 40 MG tablet Take 1 tablet (40 mg total) by mouth daily.   traMADol (ULTRAM) 50 MG tablet Take 1 tablet (50 mg total) by mouth every 12 (twelve) hours as needed for severe pain.   Vitamin D, Ergocalciferol, (DRISDOL) 1.25 MG (50000 UT) CAPS capsule Take 50,000 Units by mouth once a week. (Sundays)   No facility-administered  medications prior to visit.    Review of Systems  Constitutional: Negative.   HENT: Negative.    Respiratory: Negative.    Cardiovascular: Negative.   Musculoskeletal:        Pain and slight redness in the left calf.      Objective    BP 115/72 (BP Location: Left Arm, Patient Position: Sitting, Cuff Size: Large)   Pulse 90   Temp 97.9 F (36.6 C) (Oral)   Ht 5\' 6"  (1.676 m)   Wt 241 lb 11.2 oz (109.6 kg)   SpO2 94%   BMI 39.01 kg/m  BP Readings from Last 3 Encounters:  10/20/20 115/72  10/02/20 (!) 150/86  07/10/20 140/85   Wt Readings from Last 3 Encounters:  10/20/20 241 lb 11.2 oz (109.6 kg)  10/02/20 236 lb (107 kg)  07/10/20 233 lb (105.7 kg)    Physical Exam Constitutional:      General: She is not in acute distress.    Appearance: She is well-developed.  HENT:     Head: Normocephalic and atraumatic.     Right Ear: Hearing normal.     Left Ear: Hearing normal.     Nose: Nose normal.  Eyes:     General: Lids are normal. No scleral icterus.       Right eye: No discharge.        Left eye: No discharge.     Conjunctiva/sclera: Conjunctivae normal.  Cardiovascular:     Rate and Rhythm: Normal rate and regular rhythm.     Heart sounds: Normal heart sounds.  Pulmonary:     Effort: Pulmonary effort is normal. No respiratory distress.     Breath sounds: Normal breath sounds.  Abdominal:     General: Bowel sounds are normal.     Palpations: Abdomen is soft.  Musculoskeletal:     Comments: Pain in the right calf with some redness and ropey vein. Dry skin and erythema of the foot. Good pulses palpable. No open sores. Numbness in the plantar surface of the left foot to test with nylon string.  Skin:    Findings: No lesion or rash.  Neurological:     Mental Status: She is alert and oriented to person, place, and time.  Psychiatric:  Speech: Speech normal.        Behavior: Behavior normal.        Thought Content: Thought content normal.      No  results found for any visits on 10/20/20.  Assessment & Plan     1. Pain of left calf Onset over the past couple days. Worried about possible gout versus infection. No heat to palpation and good pulse in dorsum of left foot. Tender to palpate the left upper calf with some redness. Will check labs for gout. Suspect DVT. Requests referral to Dr. Delana Meyer (vascular surgeon) because he took care of the last DVT she had in the right leg years ago. Schedule urgent referral. - Sedimentation rate - Uric acid - D-Dimer, Quantitative - Ambulatory referral to Vascular Surgery  2. Hyperlipidemia, unspecified hyperlipidemia type Still taking Atorvastatin 40 mg qd. Recheck labs. - Lipid panel - Comprehensive metabolic panel - CBC with Differential/Platelet  3. Type 2 diabetes mellitus with hyperosmolar hyperglycemic state (HHS) ([place]) Presently using an insulin pump with Trulicity and glucovance per Dr. Gabriel Carina (endocrinologist).Hgb A1C was 10.3 on 09-02-20. Recheck labs. - Lipid panel - Comprehensive metabolic panel - CBC with Differential/Platelet  4. Essential hypertension Well controlled BP. Tolerating the HCTZ 25 mg qd. Check follow up labs. - Lipid panel - Comprehensive metabolic panel - CBC with Differential/Platelet    No follow-ups on file.      I, Esme Durkin, PA-C, have reviewed all documentation for this visit. The documentation on 10/20/20 for the exam, diagnosis, procedures, and orders are all accurate and complete.    Vernie Murders, PA-C  [employer] [phone] (phone) [phone] (fax)  [place]

## 2020-10-20 NOTE — Telephone Encounter (Signed)
Patient is calling to report new onset foot/leg pain- appointment scheduled

## 2020-10-20 NOTE — Telephone Encounter (Signed)
Reason for Disposition  [1] SEVERE pain (e.g., excruciating, unable to do any normal activities) AND [2] not improved after 2 hours of pain medicine  Answer Assessment - Initial Assessment Questions 1. ONSET: "When did the pain start?"      yesterday 2. LOCATION: "Where is the pain located?"      Below knee- back of knee- left- and travels down to top of foot- tender 3. PAIN: "How bad is the pain?"    (Scale 1-10; or mild, moderate, severe)  - MILD (1-3): doesn't interfere with normal activities.   - MODERATE (4-7): interferes with normal activities (e.g., work or school) or awakens from sleep, limping.   - SEVERE (8-10): excruciating pain, unable to do any normal activities, unable to walk.      severe 4. WORK OR EXERCISE: "Has there been any recent work or exercise that involved this part of the body?"      no 5. CAUSE: "What do you think is causing the foot pain?"     Unsure- hx gout 6. OTHER SYMPTOMS: "Do you have any other symptoms?" (e.g., leg pain, rash, fever, numbness)     Leg pain 7. PREGNANCY: "Is there any chance you are pregnant?" "When was your last menstrual period?"     N/a  Protocols used: Foot Pain-A-AH

## 2020-10-21 ENCOUNTER — Other Ambulatory Visit: Payer: Self-pay | Admitting: Family Medicine

## 2020-10-21 DIAGNOSIS — E1365 Other specified diabetes mellitus with hyperglycemia: Secondary | ICD-10-CM

## 2020-10-21 DIAGNOSIS — IMO0002 Reserved for concepts with insufficient information to code with codable children: Secondary | ICD-10-CM

## 2020-10-21 LAB — COMPREHENSIVE METABOLIC PANEL
ALT: 9 IU/L (ref 0–32)
AST: 8 IU/L (ref 0–40)
Albumin/Globulin Ratio: 1.3 (ref 1.2–2.2)
Albumin: 3.6 g/dL — ABNORMAL LOW (ref 3.8–4.8)
Alkaline Phosphatase: 98 IU/L (ref 44–121)
BUN/Creatinine Ratio: 14 (ref 12–28)
BUN: 37 mg/dL — ABNORMAL HIGH (ref 8–27)
Bilirubin Total: <0.2 mg/dL (ref 0.0–1.2)
CO2: 23 mmol/L (ref 20–29)
Calcium: 8.6 mg/dL — ABNORMAL LOW (ref 8.7–10.3)
Chloride: 103 mmol/L (ref 96–106)
Creatinine, Ser: 2.62 mg/dL — ABNORMAL HIGH (ref 0.57–1.00)
Globulin, Total: 2.7 g/dL (ref 1.5–4.5)
Glucose: 205 mg/dL — ABNORMAL HIGH (ref 65–99)
Potassium: 4.6 mmol/L (ref 3.5–5.2)
Sodium: 141 mmol/L (ref 134–144)
Total Protein: 6.3 g/dL (ref 6.0–8.5)
eGFR: 20 mL/min/{1.73_m2} — ABNORMAL LOW (ref >59)

## 2020-10-21 LAB — CBC WITH DIFFERENTIAL/PLATELET
Basophils Absolute: 0.1 10*3/uL (ref 0.0–0.2)
Basos: 1 %
EOS (ABSOLUTE): 0.7 10*3/uL — ABNORMAL HIGH (ref 0.0–0.4)
Eos: 5 %
Hematocrit: 41.2 % (ref 34.0–46.6)
Hemoglobin: 12.8 g/dL (ref 11.1–15.9)
Immature Grans (Abs): 0.1 10*3/uL (ref 0.0–0.1)
Immature Granulocytes: 1 %
Lymphocytes Absolute: 2.2 10*3/uL (ref 0.7–3.1)
Lymphs: 17 %
MCH: 26.6 pg (ref 26.6–33.0)
MCHC: 31.1 g/dL — ABNORMAL LOW (ref 31.5–35.7)
MCV: 86 fL (ref 79–97)
Monocytes Absolute: 0.8 10*3/uL (ref 0.1–0.9)
Monocytes: 6 %
Neutrophils Absolute: 9.3 10*3/uL — ABNORMAL HIGH (ref 1.4–7.0)
Neutrophils: 70 %
Platelets: 415 10*3/uL (ref 150–450)
RBC: 4.81 x10E6/uL (ref 3.77–5.28)
RDW: 14.7 % (ref 11.7–15.4)
WBC: 13.2 10*3/uL — ABNORMAL HIGH (ref 3.4–10.8)

## 2020-10-21 LAB — D-DIMER, QUANTITATIVE: D-DIMER: 2.98 mg/L FEU — ABNORMAL HIGH (ref 0.00–0.49)

## 2020-10-21 LAB — URIC ACID: Uric Acid: 8.6 mg/dL — ABNORMAL HIGH (ref 3.0–7.2)

## 2020-10-21 LAB — LIPID PANEL
Chol/HDL Ratio: 6.6 ratio — ABNORMAL HIGH (ref 0.0–4.4)
Cholesterol, Total: 271 mg/dL — ABNORMAL HIGH (ref 100–199)
HDL: 41 mg/dL (ref >39)
LDL Chol Calc (NIH): 186 mg/dL — ABNORMAL HIGH (ref 0–99)
Triglycerides: 231 mg/dL — ABNORMAL HIGH (ref 0–149)
VLDL Cholesterol Cal: 44 mg/dL — ABNORMAL HIGH (ref 5–40)

## 2020-10-21 LAB — SEDIMENTATION RATE: Sed Rate: 23 mm/hr (ref 0–40)

## 2020-10-22 ENCOUNTER — Telehealth: Payer: Self-pay

## 2020-10-22 MED ORDER — APIXABAN 5 MG PO TABS: 5.0000 mg | ORAL_TABLET | Freq: Two times a day (BID) | ORAL | 1 refills | Status: DC

## 2020-10-22 NOTE — Telephone Encounter (Signed)
Copied from [place] [id number]. Topic: General - Other >> Oct 22, 2020 11:45 AM Valere Dross wrote: Reason for CRM: Pt called wanting to get her lab results and requested if someone could give her a call back, please advise.

## 2020-10-22 NOTE — Telephone Encounter (Signed)
[place], PA-C  10/21/2020  8:33 AM EDT Back to Top    Cholesterol and triglycerides are high and should get back on the Atorvastatin 40 mg qd. D-dimer elevated and need to be seen by vascular surgeon as planned for suspected clot in leg. Eliquis 5 mg BID #60 can help thin blood a little in advance. Uric acid level is high as would be expected with gout. The sedimentation rate is normal which usually is elevated with inflammation or infection. Blood sugar was over 200 and kidney function worse. Need evaluation by a nephrologist and have Dr. Gabriel Carina review these labs to see if allopurinol appropriate to lower uric acid level.

## 2020-10-22 NOTE — Telephone Encounter (Signed)
Patient advised of lab results. Copy of labs faxed to Dr. Juleen China and Dr. Gabriel Carina. Patient should have remaining refills left on Atorvatstain. Prescription for Eliquis sent into pharmacy. Patient says her insurance may not cover Eliquis.  I advised patient that we have samples available and she can have them until she finds out if insurance will cover it. 3 week supply  of Eliquis samples placed up front.  She wants to know if you have any discount cards? Patient wants to know if she needs to continue taking Aspirin while taking Eliquis? Patient also wants to know when she needs to schedule a follow up appointment here at [facility]? Please advise.

## 2020-10-23 NOTE — Telephone Encounter (Signed)
Advised patient as below.  

## 2020-10-23 NOTE — Telephone Encounter (Signed)
The Baby ASA can be stopped while on the Eliquis and recheck will depend on the vascular specialist evaluation. Probably will need follow up in a couple weeks here.

## 2020-11-20 ENCOUNTER — Ambulatory Visit (INDEPENDENT_AMBULATORY_CARE_PROVIDER_SITE_OTHER): Payer: Medicare Other | Admitting: Vascular Surgery

## 2020-11-20 ENCOUNTER — Encounter (INDEPENDENT_AMBULATORY_CARE_PROVIDER_SITE_OTHER): Payer: Medicare Other

## 2020-11-28 ENCOUNTER — Ambulatory Visit (INDEPENDENT_AMBULATORY_CARE_PROVIDER_SITE_OTHER): Payer: Medicare Other

## 2020-11-28 DIAGNOSIS — E1100 Type 2 diabetes mellitus with hyperosmolarity without nonketotic hyperglycemic-hyperosmolar coma (NKHHC): Secondary | ICD-10-CM

## 2020-11-28 NOTE — Patient Instructions (Addendum)
Thank you for allowing the Chronic Care Management team to participate in your care.    Patient Care Plan: Diabetes Type 2 (Adult)     Problem Identified: Glycemic Management (Diabetes, Type 2)      Long-Range Goal: Glycemic Management Optimized   Start Date: 11/28/2020  Expected End Date: 02/26/2021  Priority: High  Note:   Lab Results  Component Value Date   HGBA1C 10.2 (H) 05/31/2020      Current Barriers:  Knowledge Deficits related to basic Diabetes pathophysiology and self care/management  Case Manager Clinical Goal(s):  Over the next 90 days, patient will demonstrate improved adherence to prescribed treatment plan for diabetes self care/management as evidenced by daily monitoring and recording of CBG, adhering to recommended ADA/ carb modified diet, and adhering to prescribed medication regimen. Lower A1C by at least 1 point.  Interventions:  Collaboration with provider regarding development and update of comprehensive plan of care as evidenced by provider attestation and co-signature Inter-disciplinary care team collaboration (see longitudinal plan of care) Reviewed medications and compliance with current treatment plan. Reports taking medications and utilizing insulin pump as prescribed. Denies concerns regarding medication management.  Reviewed s/sx of hyperglycemia and hypoglycemia along with appropriate interventions. Recalls some fluctuations with readings over the past week. Reports highest reading was in the 200's. Thoroughly reviewed s/sx of hypoglycemia and hyperglycemia. She verbalized awareness of appropriate interventions for readings outside of the established range. Advised to continue monitoring readings  as advised. Reports completing appointments with the Endocrinology clinic as scheduled.  Discussed pending appointments. Her Endocrinology appt was rescheduled d/t pending evaluation with a retina specialist. Advised to complete outreach as scheduled. Denies  concerns regarding transportation. Reviewed s/sx and indications for seeking immediate medical attention.    Patient Goals/Self-Care Activities Self administer oral medications and insulin as prescribed Complete provider appointments as scheduled Monitor blood glucose levels consistently and utilize recommended interventions Adhere to prescribed ADA/carb modified Notify provider or care management team with health related questions and concern as needed   Follow Up Plan:  Will follow up within the next month     Mrs. Venne verbalized understanding of the information discussed during the telephonic outreach. Declined need for mailed/printed instructions. A member of the care management team will follow up within the next month.   [facility] Care Management [facility] [phone]

## 2020-11-28 NOTE — Chronic Care Management (AMB) (Signed)
Chronic Care Management   CCM RN Visit Note  11/28/2020 Name: Jocelyn Wells MRN: [id number] DOB: 1955/06/11  Subjective: Jocelyn Wells is a 65 y.o. year old female who is a primary care patient of Chrismon, Vickki Muff, PA-C (Inactive). The care management team was consulted for assistance with disease management and care coordination needs.    Engaged with patient by telephone for follow up visit in response to provider referral for case management and care coordination services.   Consent to Services:  The patient was given information about Chronic Care Management services, agreed to services, and gave verbal consent prior to initiation of services.  Please see initial visit note for detailed documentation.   Assessment: Review of patient past medical history, allergies, medications, health status, including review of consultants reports, laboratory and other test data, was performed as part of comprehensive evaluation and provision of chronic care management services.   SDOH (Social Determinants of Health) assessments and interventions performed:  No  CCM Care Plan  Allergies  Allergen Reactions   Codeine Nausea And Vomiting    Outpatient Encounter Medications as of 11/28/2020  Medication Sig Note   apixaban (ELIQUIS) 5 MG TABS tablet Take 1 tablet (5 mg total) by mouth 2 (two) times daily.    aspirin EC 81 MG tablet Take 81 mg by mouth daily. Swallow whole.    atorvastatin (LIPITOR) 40 MG tablet TAKE 1 TABLET BY MOUTH EVERY DAY    B-D INS SYRINGE 0.5CC/31GX5/16 31G X 5/16" 0.5 ML MISC     denosumab (PROLIA) 60 MG/ML SOSY injection Inject 60 mg into the skin every 6 (six) months.    Dulaglutide (TRULICITY) 1.5 HC/6.2BJ SOPN Inject 1.5 mg into the skin every 7 (seven) days. (Monday Nights)    gabapentin (NEURONTIN) 300 MG capsule Take 1 capsule (300 mg total) by mouth daily. (Patient taking differently: Take 300 mg by mouth 2 (two) times daily.) 08/15/2020: Reports taking one  capsule every night and being informed to take an additional capsule in the morning if needed.   glucose blood test strip OneTouch Verio strips    glyBURIDE-metformin (GLUCOVANCE) 5-500 MG per tablet Take 2 tablets by mouth 2 (two) times daily.     hydrochlorothiazide (HYDRODIURIL) 25 MG tablet Take 25 mg by mouth daily.    Insulin Human (INSULIN PUMP) SOLN Per endocrinoloy    loratadine (CLARITIN) 10 MG tablet Take 1 tablet by mouth daily.    losartan (COZAAR) 25 MG tablet Take 2 tablets (50 mg total) by mouth daily.    mupirocin ointment (BACTROBAN) 2 % mupirocin 2 % topical ointment    PARoxetine (PAXIL) 40 MG tablet Take 1 tablet (40 mg total) by mouth daily.    traMADol (ULTRAM) 50 MG tablet Take 1 tablet (50 mg total) by mouth every 12 (twelve) hours as needed for severe pain.    Vitamin D, Ergocalciferol, (DRISDOL) 1.25 MG (50000 UT) CAPS capsule Take 50,000 Units by mouth once a week. (Sundays)    No facility-administered encounter medications on file as of 11/28/2020.    Patient Active Problem List   Diagnosis Date Noted   Type 2 diabetes mellitus with hyperosmolar hyperglycemic state (HHS) ([place]) 05/31/2020   CKD (chronic kidney disease), stage IV ([place]) 05/31/2020   History of CVA (cerebrovascular accident) 05/31/2020   Hyperglycemia 05/31/2020   MDD (major depressive disorder), recurrent, in full remission ([place]) 04/09/2019   Compression fracture of L1 lumbar vertebra ([place]) 01/30/2019   Compression fracture of body of thoracic  vertebra ([place]) 01/30/2019   Lumbar foraminal stenosis (RIGHT L1) 01/30/2019   Spinal stenosis of lumbar region with neurogenic claudication (L4-L5) 01/30/2019   DVT (deep venous thrombosis) ([place]) 02/09/2018   CVA (cerebral vascular accident) ([place]) 10/05/2017   Depression 06/12/2015   Cerebrovascular accident (CVA) due to bilateral thrombosis of carotid arteries ([place]) 06/12/2015   Excessive daytime sleepiness 06/12/2015   Nicotine dependence 04/24/2015    Snoring 04/24/2015   Sleep paralysis, recurrent isolated 04/24/2015   Hypersomnia with sleep apnea 04/24/2015   Cataplexy 04/24/2015   Morbid obesity due to excess calories ([place]) 04/24/2015   COPD (chronic obstructive pulmonary disease) ([place]) 04/24/2015   Cerebrovascular accident (CVA) due to thrombosis of left middle cerebral artery ([place]) 24/40/1027   Embolic stroke ([place]) 25/36/6440   Fatigue 04/23/2015   Type 2 diabetes mellitus ([place]) 34/74/2595   Chronic systolic heart failure ([place]) 03/25/2015   Stroke ([place]) 01/31/2015   Nausea & vomiting 01/29/2015   AKI (acute kidney injury) ([place]) 01/29/2015   CVA (cerebral infarction) 01/29/2015   Stroke (cerebrum) ([place]) 01/29/2015   Dizziness and giddiness 07/05/2014   Essential hypertension 07/05/2014   Carbuncle and furuncle 07/05/2014   BP (high blood pressure) 07/05/2014   Does not feel right 07/05/2014   Pain of perianal area 07/05/2014   Guttate psoriasis 07/05/2014   Gonalgia 07/05/2014   H/O diabetes mellitus 06/05/2014   CAFL (chronic airflow limitation) ([place]) 06/05/2014   H/O visual disturbance 06/05/2014   H/O elevated lipids 06/05/2014   H/O: HTN (hypertension) 06/05/2014   H/O: osteoarthritis 06/05/2014   Initial insomnia 06/05/2014   Anxiety, generalized 06/05/2014   Depression, major, recurrent, moderate ([place]) 06/05/2014   Microalbuminuria 12/23/2013   Long term current use of insulin ([place]) 12/23/2013   Compulsive tobacco user syndrome 12/23/2013   Type 2 diabetes mellitus with other diabetic kidney complication ([place]) 63/87/5643   Type II diabetes mellitus with renal manifestations, uncontrolled 12/23/2013   Contusion of cheek 03/31/2009   Change in blood platelet count 06/08/2007   Blood in feces 06/01/2007   D (diarrhea) 06/01/2007   Disturbance of skin sensation 09/02/2006   Difficulty hearing 07/25/2006   Acute onset aura migraine 06/27/2006   Cephalalgia 05/27/2006   Clinical depression 10/22/2005   Diabetes  mellitus type 2, uncontrolled 10/22/2005   HLD (hyperlipidemia) 10/22/2005   Arthritis, degenerative 10/22/2005   Current tobacco use 10/22/2005    Conditions to be addressed/monitored:DMII Patient Care Plan: Diabetes Type 2 (Adult)     Problem Identified: Glycemic Management (Diabetes, Type 2)      Long-Range Goal: Glycemic Management Optimized   Start Date: 11/28/2020  Expected End Date: 02/26/2021  Priority: High  Note:   Lab Results  Component Value Date   HGBA1C 10.2 (H) 05/31/2020      Current Barriers:  Knowledge Deficits related to basic Diabetes pathophysiology and self care/management  Case Manager Clinical Goal(s):  Over the next 90 days, patient will demonstrate improved adherence to prescribed treatment plan for diabetes self care/management as evidenced by daily monitoring and recording of CBG, adhering to recommended ADA/ carb modified diet, and adhering to prescribed medication regimen. Lower A1C by at least 1 point.  Interventions:  Collaboration with provider regarding development and update of comprehensive plan of care as evidenced by provider attestation and co-signature Inter-disciplinary care team collaboration (see longitudinal plan of care) Reviewed medications and compliance with current treatment plan. Reports taking medications and utilizing insulin pump as prescribed. Denies concerns regarding medication management.  Reviewed s/sx of hyperglycemia  and hypoglycemia along with appropriate interventions. Recalls some fluctuations with readings over the past week. Reports highest reading was in the 200's. Thoroughly reviewed s/sx of hypoglycemia and hyperglycemia. She verbalized awareness of appropriate interventions for readings outside of the established range. Advised to continue monitoring readings  as advised. Reports completing appointments with the Endocrinology clinic as scheduled.  Discussed pending appointments. Her Endocrinology appt was  rescheduled d/t pending evaluation with a retina specialist. Advised to complete outreach as scheduled. Denies concerns regarding transportation. Reviewed s/sx and indications for seeking immediate medical attention.    Patient Goals/Self-Care Activities Self administer oral medications and insulin as prescribed Complete provider appointments as scheduled Monitor blood glucose levels consistently and utilize recommended interventions Adhere to prescribed ADA/carb modified Notify provider or care management team with health related questions and concern as needed   Follow Up Plan:  Will follow up within the next month     PLAN A member of the care management team will follow up within the next month.   Cristy Friedlander [facility] Care Management [facility] [phone]

## 2020-12-02 DIAGNOSIS — E113513 Type 2 diabetes mellitus with proliferative diabetic retinopathy with macular edema, bilateral: Secondary | ICD-10-CM | POA: Diagnosis not present

## 2020-12-02 DIAGNOSIS — H43823 Vitreomacular adhesion, bilateral: Secondary | ICD-10-CM | POA: Diagnosis not present

## 2020-12-02 DIAGNOSIS — H4311 Vitreous hemorrhage, right eye: Secondary | ICD-10-CM | POA: Diagnosis not present

## 2020-12-02 DIAGNOSIS — H43811 Vitreous degeneration, right eye: Secondary | ICD-10-CM | POA: Diagnosis not present

## 2020-12-05 ENCOUNTER — Telehealth: Payer: Self-pay | Admitting: Family Medicine

## 2020-12-05 ENCOUNTER — Ambulatory Visit: Payer: Self-pay

## 2020-12-05 ENCOUNTER — Other Ambulatory Visit (INDEPENDENT_AMBULATORY_CARE_PROVIDER_SITE_OTHER): Payer: Self-pay | Admitting: Vascular Surgery

## 2020-12-05 DIAGNOSIS — M79662 Pain in left lower leg: Secondary | ICD-10-CM

## 2020-12-05 DIAGNOSIS — I5022 Chronic systolic (congestive) heart failure: Secondary | ICD-10-CM

## 2020-12-05 DIAGNOSIS — E1100 Type 2 diabetes mellitus with hyperosmolarity without nonketotic hyperglycemic-hyperosmolar coma (NKHHC): Secondary | ICD-10-CM

## 2020-12-05 MED ORDER — APIXABAN 5 MG PO TABS: 5.0000 mg | ORAL_TABLET | Freq: Two times a day (BID) | ORAL | 1 refills | Status: DC

## 2020-12-05 NOTE — Telephone Encounter (Signed)
Rx was sent to pharmacy. 

## 2020-12-05 NOTE — Telephone Encounter (Signed)
[facility] faxed refill request for the following medications:   apixaban (ELIQUIS) 5 MG TABS tablet   Please advise.

## 2020-12-05 NOTE — Chronic Care Management (AMB) (Signed)
Chronic Care Management   CCM RN Visit Note  12/05/2020 Name: Jocelyn Wells MRN: [id number] DOB: January 11, 1956  Subjective: Jocelyn Wells is a 65 y.o. year old female who is a primary care patient of Bacigalupo, Dionne Bucy, MD. The care management team was consulted for assistance with disease management and care coordination needs.    Engaged with patient by telephone for follow up visit in response to provider referral for case management and care coordination services.   Consent to Services:  The patient was given information about Chronic Care Management services, agreed to services, and gave verbal consent prior to initiation of services.  Please see initial visit note for detailed documentation.   Assessment: Review of patient past medical history, allergies, medications, health status, including review of consultants reports, laboratory and other test data, was performed as part of comprehensive evaluation and provision of chronic care management services.   SDOH (Social Determinants of Health) assessments and interventions performed:  No  CCM Care Plan  Allergies  Allergen Reactions   Codeine Nausea And Vomiting    Outpatient Encounter Medications as of 12/05/2020  Medication Sig Note   apixaban (ELIQUIS) 5 MG TABS tablet Take 1 tablet (5 mg total) by mouth 2 (two) times daily.    aspirin EC 81 MG tablet Take 81 mg by mouth daily. Swallow whole.    atorvastatin (LIPITOR) 40 MG tablet TAKE 1 TABLET BY MOUTH EVERY DAY    B-D INS SYRINGE 0.5CC/31GX5/16 31G X 5/16" 0.5 ML MISC     denosumab (PROLIA) 60 MG/ML SOSY injection Inject 60 mg into the skin every 6 (six) months.    Dulaglutide (TRULICITY) 1.5 RK/2.7CW SOPN Inject 1.5 mg into the skin every 7 (seven) days. (Monday Nights)    gabapentin (NEURONTIN) 300 MG capsule Take 1 capsule (300 mg total) by mouth daily. (Patient taking differently: Take 300 mg by mouth 2 (two) times daily.) 08/15/2020: Reports taking one capsule every  night and being informed to take an additional capsule in the morning if needed.   glucose blood test strip OneTouch Verio strips    glyBURIDE-metformin (GLUCOVANCE) 5-500 MG per tablet Take 2 tablets by mouth 2 (two) times daily.     hydrochlorothiazide (HYDRODIURIL) 25 MG tablet Take 25 mg by mouth daily.    Insulin Human (INSULIN PUMP) SOLN Per endocrinoloy    loratadine (CLARITIN) 10 MG tablet Take 1 tablet by mouth daily.    losartan (COZAAR) 25 MG tablet Take 2 tablets (50 mg total) by mouth daily.    mupirocin ointment (BACTROBAN) 2 % mupirocin 2 % topical ointment    PARoxetine (PAXIL) 40 MG tablet Take 1 tablet (40 mg total) by mouth daily.    traMADol (ULTRAM) 50 MG tablet Take 1 tablet (50 mg total) by mouth every 12 (twelve) hours as needed for severe pain.    Vitamin D, Ergocalciferol, (DRISDOL) 1.25 MG (50000 UT) CAPS capsule Take 50,000 Units by mouth once a week. (Sundays)    No facility-administered encounter medications on file as of 12/05/2020.    Patient Active Problem List   Diagnosis Date Noted   Type 2 diabetes mellitus with hyperosmolar hyperglycemic state (HHS) ([place]) 05/31/2020   CKD (chronic kidney disease), stage IV ([place]) 05/31/2020   History of CVA (cerebrovascular accident) 05/31/2020   Hyperglycemia 05/31/2020   MDD (major depressive disorder), recurrent, in full remission ([place]) 04/09/2019   Compression fracture of L1 lumbar vertebra ([place]) 01/30/2019   Compression fracture of body of thoracic vertebra (  [place]) 01/30/2019   Lumbar foraminal stenosis (RIGHT L1) 01/30/2019   Spinal stenosis of lumbar region with neurogenic claudication (L4-L5) 01/30/2019   DVT (deep venous thrombosis) ([place]) 02/09/2018   CVA (cerebral vascular accident) ([place]) 10/05/2017   Depression 06/12/2015   Cerebrovascular accident (CVA) due to bilateral thrombosis of carotid arteries ([place]) 06/12/2015   Excessive daytime sleepiness 06/12/2015   Nicotine dependence 04/24/2015   Snoring  04/24/2015   Sleep paralysis, recurrent isolated 04/24/2015   Hypersomnia with sleep apnea 04/24/2015   Cataplexy 04/24/2015   Morbid obesity due to excess calories ([place]) 04/24/2015   COPD (chronic obstructive pulmonary disease) ([place]) 04/24/2015   Cerebrovascular accident (CVA) due to thrombosis of left middle cerebral artery ([place]) 90/30/0923   Embolic stroke ([place]) 30/08/6224   Fatigue 04/23/2015   Type 2 diabetes mellitus ([place]) 33/35/4562   Chronic systolic heart failure ([place]) 03/25/2015   Stroke ([place]) 01/31/2015   Nausea & vomiting 01/29/2015   AKI (acute kidney injury) ([place]) 01/29/2015   CVA (cerebral infarction) 01/29/2015   Stroke (cerebrum) ([place]) 01/29/2015   Dizziness and giddiness 07/05/2014   Essential hypertension 07/05/2014   Carbuncle and furuncle 07/05/2014   BP (high blood pressure) 07/05/2014   Does not feel right 07/05/2014   Pain of perianal area 07/05/2014   Guttate psoriasis 07/05/2014   Gonalgia 07/05/2014   H/O diabetes mellitus 06/05/2014   CAFL (chronic airflow limitation) ([place]) 06/05/2014   H/O visual disturbance 06/05/2014   H/O elevated lipids 06/05/2014   H/O: HTN (hypertension) 06/05/2014   H/O: osteoarthritis 06/05/2014   Initial insomnia 06/05/2014   Anxiety, generalized 06/05/2014   Depression, major, recurrent, moderate ([place]) 06/05/2014   Microalbuminuria 12/23/2013   Long term current use of insulin ([place]) 12/23/2013   Compulsive tobacco user syndrome 12/23/2013   Type 2 diabetes mellitus with other diabetic kidney complication ([place]) 56/38/9373   Type II diabetes mellitus with renal manifestations, uncontrolled 12/23/2013   Contusion of cheek 03/31/2009   Change in blood platelet count 06/08/2007   Blood in feces 06/01/2007   D (diarrhea) 06/01/2007   Disturbance of skin sensation 09/02/2006   Difficulty hearing 07/25/2006   Acute onset aura migraine 06/27/2006   Cephalalgia 05/27/2006   Clinical depression 10/22/2005   Diabetes mellitus  type 2, uncontrolled 10/22/2005   HLD (hyperlipidemia) 10/22/2005   Arthritis, degenerative 10/22/2005   Current tobacco use 10/22/2005    Conditions to be addressed/monitored:CHF  Patient Care Plan: Heart Failure (Adult)     Problem Identified: Symptom Exacerbation (Heart Failure)      Long-Range Goal: Symptom Exacerbation Prevented or Minimized   Start Date: 11/28/2020  Expected End Date: 02/26/2021  Priority: High  Note:   Current Barriers:  Chronic Disease Management needs and support r/t Heart Failure   Nurse Case Manager Clinical Goal(s):  Over the next 120 patient will not require hospitalization for complications r/t Heart Failure exacerbation  Interventions:  Collaboration with Chrismon, Vickki Muff, PA-C regarding development and update of comprehensive plan of care as evidenced by provider attestation and co-signature Inter-disciplinary care team collaboration (see longitudinal plan of care) Reviewed medications. Encouraged to take as prescribed. Advised to notify provider if unable to tolerate prescribed regimen Discussed current plan for Heart Failure self management. Discussed importance of weighing daily and recording readings. Advised to notify provider for weight gain greater than 3 lbs overnight or 5 lbs within a week. Reviewed s/sx of fluid overload. Discussed indications for seeking immediate medical attention Discussed dietary habits and importance of monitoring sodium intake.  Encouraged to read nutrition labels and adhere to recommended diet. Update on 12/05/20: Patient requires assistance with appointment scheduling. Her previous PCP is no longer at the clinic. Contacted [employer]. Patient will due for a follow up in approximately two months. First available with new provider will be in January. Appointment scheduled for 03/02/21. Patient advised to update the care management team if assistance is required with transportation.  Patient Goals/Self-Care  Activities Take medications as prescribed Follow recommended plan for CHF management Monitor and record weights Notify provider with weight gain greater than 3 lbs overnight or 5 lbs in a week Attend clinic appointments as scheduled Contact provider or care management team with new questions or concerns       PLAN: A member of the care management team will follow up next month.   Cristy Friedlander [facility] Care Management [facility] [phone]

## 2020-12-08 ENCOUNTER — Ambulatory Visit (INDEPENDENT_AMBULATORY_CARE_PROVIDER_SITE_OTHER): Payer: Medicare Other | Admitting: Vascular Surgery

## 2020-12-08 ENCOUNTER — Ambulatory Visit (INDEPENDENT_AMBULATORY_CARE_PROVIDER_SITE_OTHER): Payer: Medicare Other

## 2020-12-08 ENCOUNTER — Encounter (INDEPENDENT_AMBULATORY_CARE_PROVIDER_SITE_OTHER): Payer: Self-pay | Admitting: Vascular Surgery

## 2020-12-08 ENCOUNTER — Other Ambulatory Visit: Payer: Self-pay

## 2020-12-08 VITALS — BP 169/100 | HR 93 | Ht 66.0 in | Wt 230.0 lb

## 2020-12-08 DIAGNOSIS — E785 Hyperlipidemia, unspecified: Secondary | ICD-10-CM | POA: Diagnosis not present

## 2020-12-08 DIAGNOSIS — I5022 Chronic systolic (congestive) heart failure: Secondary | ICD-10-CM | POA: Diagnosis not present

## 2020-12-08 DIAGNOSIS — M79662 Pain in left lower leg: Secondary | ICD-10-CM

## 2020-12-08 DIAGNOSIS — E1100 Type 2 diabetes mellitus with hyperosmolarity without nonketotic hyperglycemic-hyperosmolar coma (NKHHC): Secondary | ICD-10-CM

## 2020-12-08 DIAGNOSIS — I10 Essential (primary) hypertension: Secondary | ICD-10-CM

## 2020-12-08 DIAGNOSIS — I63312 Cerebral infarction due to thrombosis of left middle cerebral artery: Secondary | ICD-10-CM

## 2020-12-08 DIAGNOSIS — I82511 Chronic embolism and thrombosis of right femoral vein: Secondary | ICD-10-CM | POA: Diagnosis not present

## 2020-12-08 DIAGNOSIS — E1129 Type 2 diabetes mellitus with other diabetic kidney complication: Secondary | ICD-10-CM | POA: Diagnosis not present

## 2020-12-08 NOTE — Progress Notes (Signed)
MRN : [id number]  Jocelyn Wells is a 65 y.o. (July 14, 1955) female who presents with chief complaint of check my leg.  History of Present Illness:   The patient presents to the office for follow-up evaluation of DVT.  DVT was identified remotely at [facility] by Duplex ultrasound.  The initial symptoms were pain and swelling in the lower extremity.  She was initiated on anticoagulation at that time.  She is continued her oral anticoagulation and there have been no complications.   The patient notes the leg is much less painful with dependency but still swells a bite.  Symptoms are much better with elevation.  The patient notes minimal edema in the morning which steadily worsens throughout the day.     The patient has been using compression therapy.   No SOB or pleuritic chest pains.  No cough or hemoptysis.   No blood per rectum or blood in any sputum.  No excessive bruising per the patient.   Current Meds  Medication Sig   apixaban (ELIQUIS) 5 MG TABS tablet Take 1 tablet (5 mg total) by mouth 2 (two) times daily.   aspirin EC 81 MG tablet Take 81 mg by mouth daily. Swallow whole.   atorvastatin (LIPITOR) 40 MG tablet TAKE 1 TABLET BY MOUTH EVERY DAY   B-D INS SYRINGE 0.5CC/31GX5/16 31G X 5/16" 0.5 ML MISC    denosumab (PROLIA) 60 MG/ML SOSY injection Inject 60 mg into the skin every 6 (six) months.   Dulaglutide (TRULICITY) 1.5 DX/4.1OI SOPN Inject 1.5 mg into the skin every 7 (seven) days. (Monday Nights)   glucose blood test strip OneTouch Verio strips   glyBURIDE-metformin (GLUCOVANCE) 5-500 MG per tablet Take 2 tablets by mouth 2 (two) times daily.    hydrochlorothiazide (HYDRODIURIL) 25 MG tablet Take 25 mg by mouth daily.   insulin aspart (NOVOLOG) 100 UNIT/ML injection Inject up to 100 units daily in insulin pump   Insulin Disposable Pump (OMNIPOD DASH PODS, GEN 4,) MISC USE 1 POD EACH EVERY 72    HOURS   Insulin Disposable Pump (OMNIPOD DASH PODS, GEN 4,) MISC  Inject into the skin.   Insulin Human (INSULIN PUMP) SOLN Per endocrinoloy   loratadine (CLARITIN) 10 MG tablet Take 1 tablet by mouth daily.   losartan (COZAAR) 25 MG tablet Take 2 tablets (50 mg total) by mouth daily.   mupirocin ointment (BACTROBAN) 2 % mupirocin 2 % topical ointment   PARoxetine (PAXIL) 40 MG tablet Take 1 tablet (40 mg total) by mouth daily.   traMADol (ULTRAM) 50 MG tablet Take 1 tablet (50 mg total) by mouth every 12 (twelve) hours as needed for severe pain.   Vitamin D, Ergocalciferol, (DRISDOL) 1.25 MG (50000 UT) CAPS capsule Take 50,000 Units by mouth once a week. (Sundays)   [DISCONTINUED] gabapentin (NEURONTIN) 300 MG capsule Take 1 capsule by mouth 2 (two) times daily.   [DISCONTINUED] losartan (COZAAR) 100 MG tablet Take 1 tablet (100 mg total) by mouth once daily    Past Medical History:  Diagnosis Date   Anxiety    Cardiomyopathy [facility])    new to her Jan 2017   COPD (chronic obstructive pulmonary disease) ([place])    Depression    Diabetes mellitus, type II ([place])    HTN (hypertension)    Obesity    Osteoporosis    PONV (postoperative nausea and vomiting)    Stroke [facility])    Jan 2017    Past Surgical History:  Procedure  Laterality Date   ABDOMINAL HYSTERECTOMY     ANKLE FRACTURE SURGERY Left 2002   BILATERAL SALPINGOOPHORECTOMY  2000   CARDIAC CATHETERIZATION N/A 03/25/2015   Procedure: Right/Left Heart Cath and Coronary Angiography;  Surgeon: Belva Crome, MD;  Location: [place] CV LAB;  Service: Cardiovascular;  Laterality: N/A;   CESAREAN SECTION  1978   COLONOSCOPY WITH PROPOFOL N/A 09/22/2016   Procedure: COLONOSCOPY WITH PROPOFOL;  Surgeon: Manya Silvas, MD;  Location: [facility] ENDOSCOPY;  Service: Endoscopy;  Laterality: N/A;   EP IMPLANTABLE DEVICE N/A 08/05/2015   Procedure: Loop Recorder Insertion;  Surgeon: Deboraha Sprang, MD;  Location: [place] CV LAB;  Service: Cardiovascular;  Laterality: N/A;   ESOPHAGOGASTRODUODENOSCOPY (EGD)  WITH PROPOFOL N/A 09/22/2016   Procedure: ESOPHAGOGASTRODUODENOSCOPY (EGD) WITH PROPOFOL;  Surgeon: Manya Silvas, MD;  Location: [facility] ENDOSCOPY;  Service: Endoscopy;  Laterality: N/A;   ESOPHAGOGASTRODUODENOSCOPY (EGD) WITH PROPOFOL N/A 12/08/2016   Procedure: ESOPHAGOGASTRODUODENOSCOPY (EGD) WITH PROPOFOL;  Surgeon: Manya Silvas, MD;  Location: [facility] ENDOSCOPY;  Service: Endoscopy;  Laterality: N/A;   KNEE ARTHROSCOPY Left 2005   TEE WITHOUT CARDIOVERSION N/A 01/31/2015   Procedure: TRANSESOPHAGEAL ECHOCARDIOGRAM (TEE);  Surgeon: Lelon Perla, MD;  Location: [facility] ENDOSCOPY;  Service: Cardiovascular;  Laterality: N/A;   [place]  2008    Social History Social History   Tobacco Use   Smoking status: Every Day    Packs/day: 1.00    Years: 36.00    Pack years: 36.00    Types: Cigarettes    Start date: 02/08/1974   Smokeless tobacco: Never   Tobacco comments:    "I can not quit smoking, I have tried". 1 PPD  Vaping Use   Vaping Use: Never used  Substance Use Topics   Alcohol use: No    Alcohol/week: 0.0 standard drinks   Drug use: No    Family History Family History  Problem Relation Age of Onset   Heart disease Mother        died from CHF   Asthma Mother    Diabetes Mother    Heart disease Father    Aneurysm Father    COPD Brother    Diabetes Brother    Alcohol abuse Paternal Aunt    Anemia Neg Hx    Arrhythmia Neg Hx    Clotting disorder Neg Hx    Fainting Neg Hx    Heart attack Neg Hx    Heart failure Neg Hx    Hyperlipidemia Neg Hx    Hypertension Neg Hx     Allergies  Allergen Reactions   Codeine Nausea And Vomiting     REVIEW OF SYSTEMS (Negative unless checked)  Constitutional: [] Weight loss  [] Fever  [] Chills Cardiac: [] Chest pain   [] Chest pressure   [] Palpitations   [] Shortness of breath when laying flat   [] Shortness of breath with exertion. Vascular:  [] Pain in legs with walking   [] Pain in legs at  rest  [x] History of DVT   [] Phlebitis   [x] Swelling in legs   [] Varicose veins   [] Non-healing ulcers Pulmonary:   [] Uses home oxygen   [] Productive cough   [] Hemoptysis   [] Wheeze  [x] COPD   [] Asthma Neurologic:  [] Dizziness   [] Seizures   [] History of stroke   [] History of TIA  [] Aphasia   [] Vissual changes   [] Weakness or numbness in arm   [] Weakness or numbness in leg Musculoskeletal:   [] Joint swelling   [] Joint  pain   [x] Back pain Hematologic:  [] Easy bruising  [] Easy bleeding   [] Hypercoagulable state   [] Anemic Gastrointestinal:  [] Diarrhea   [] Vomiting  [] Gastroesophageal reflux/heartburn   [] Difficulty swallowing. Genitourinary:  [] Chronic kidney disease   [] Difficult urination  [] Frequent urination   [] Blood in urine Skin:  [] Rashes   [] Ulcers  Psychological:  [] History of anxiety   []  History of major depression.  Physical Examination  Vitals:   12/08/20 1505  BP: (!) 169/100  Pulse: 93  Weight: 230 lb (104.3 kg)  Height: 5\' 6"  (1.676 m)   Body mass index is 37.12 kg/m. Gen: WD/WN, NAD Head: [place]/AT, No temporalis wasting.  Ear/Nose/Throat: Hearing grossly intact, nares w/o erythema or drainage, pinna without lesions Eyes: PER, EOMI, sclera nonicteric.  Neck: Supple, no gross masses.  No JVD.  Pulmonary:  Good air movement, no audible wheezing, no use of accessory muscles.  Cardiac: RRR, precordium not hyperdynamic. Vascular:  scattered varicosities present bilaterally.  Mild venous stasis changes to the legs bilaterally.  2+ soft pitting edema  Vessel Right Left  Radial Palpable Palpable  Gastrointestinal: soft, non-distended. No guarding/no peritoneal signs.  Musculoskeletal: M/S 5/5 throughout.  No deformity.  Neurologic: CN 2-12 intact. Pain and light touch intact in extremities.  Symmetrical.  Speech is fluent. Motor exam as listed above. Psychiatric: Judgment intact, Mood & affect appropriate for pt's clinical situation. Dermatologic: Venous rashes no ulcers noted.   No changes consistent with cellulitis. Lymph : No lichenification or skin changes of chronic lymphedema.  CBC Lab Results  Component Value Date   WBC 13.2 (H) 10/20/2020   HGB 12.8 10/20/2020   HCT 41.2 10/20/2020   MCV 86 10/20/2020   PLT 415 10/20/2020    BMET    Component Value Date/Time   NA 141 10/20/2020 1554   K 4.6 10/20/2020 1554   CL 103 10/20/2020 1554   CO2 23 10/20/2020 1554   GLUCOSE 205 (H) 10/20/2020 1554   GLUCOSE 141 (H) 06/01/2020 0440   BUN 37 (H) 10/20/2020 1554   CREATININE 2.62 (H) 10/20/2020 1554   CREATININE 1.37 (H) 03/21/2015 1451   CALCIUM 8.6 (L) 10/20/2020 1554   GFRNONAA 31 (L) 06/01/2020 0440   GFRAA 33 (L) 04/23/2019 1446   CrCl cannot be calculated (Patient's most recent lab result is older than the maximum 21 days allowed.).  COAG Lab Results  Component Value Date   INR 0.93 10/05/2017   INR 1.05 03/21/2015   INR 1.13 01/31/2015    Radiology No results found.   Assessment/Plan 1. Chronic deep vein thrombosis (DVT) of femoral vein of right lower extremity (HCC) Recommend:    No surgery or intervention at this point in time.  IVC filter is not indicated at present.   Patient's duplex ultrasound of the venous system shows DVT from the popliteal to the femoral veins.   The patient is on anticoagulation    Elevation was stressed, use of a recliner was discussed.   I have reviewed with the patient DVT and post phlebitic changes such as swelling and why it  causes symptoms such as pain.  The patient will wear graduated compression stockings, on a daily basis a prescription was given. The patient will  beginning wearing the stockings first thing in the morning and removing them in the evening. The patient is instructed specifically not to sleep in the stockings.  In addition, behavioral modification including elevation during the day and avoidance of prolonged dependency will be initiated.  2. Essential hypertension Continue  antihypertensive medications as already ordered, these medications have been reviewed and there are no changes at this time.   3. Type 2 diabetes mellitus with other diabetic kidney complication (HCC) Continue hypoglycemic medications as already ordered, these medications have been reviewed and there are no changes at this time.  Hgb A1C to be monitored as already arranged by primary service   4. Hyperlipidemia, unspecified hyperlipidemia type Continue statin as ordered and reviewed, no changes at this time     [name], MD  12/08/2020 3:10 PM

## 2020-12-10 ENCOUNTER — Telehealth: Payer: Self-pay | Admitting: Family Medicine

## 2020-12-10 DIAGNOSIS — Z794 Long term (current) use of insulin: Secondary | ICD-10-CM

## 2020-12-10 DIAGNOSIS — E1143 Type 2 diabetes mellitus with diabetic autonomic (poly)neuropathy: Secondary | ICD-10-CM

## 2020-12-10 DIAGNOSIS — I10 Essential (primary) hypertension: Secondary | ICD-10-CM

## 2020-12-10 MED ORDER — LOSARTAN POTASSIUM 25 MG PO TABS: 50.0000 mg | ORAL_TABLET | Freq: Every day | ORAL | 1 refills | Status: DC

## 2020-12-10 NOTE — Telephone Encounter (Signed)
[facility] faxed refill request for the following medications:   losartan (COZAAR) 25 MG tablet   Please advise.

## 2020-12-12 DIAGNOSIS — E113513 Type 2 diabetes mellitus with proliferative diabetic retinopathy with macular edema, bilateral: Secondary | ICD-10-CM | POA: Diagnosis not present

## 2020-12-16 ENCOUNTER — Encounter (INDEPENDENT_AMBULATORY_CARE_PROVIDER_SITE_OTHER): Payer: Self-pay | Admitting: Vascular Surgery

## 2020-12-16 MED ORDER — APIXABAN 5 MG PO TABS: 5.0000 mg | ORAL_TABLET | Freq: Two times a day (BID) | ORAL | 3 refills | Status: DC

## 2020-12-22 ENCOUNTER — Telehealth: Payer: Self-pay | Admitting: Family Medicine

## 2020-12-22 NOTE — Telephone Encounter (Signed)
Copied from [place] [phone]. Topic: Medicare AWV >> Dec 22, 2020 12:19 PM Cher Nakai R wrote: Reason for CRM:  Left message for patient to call back and schedule Medicare Annual Wellness Visit (AWV) in office.   If not able to come in office, please offer to do virtually or by telephone.   Last AWV: 04/10/2018  Please schedule at anytime with [facility] Health Advisor.  If any questions, please contact me at [phone]

## 2021-01-28 DIAGNOSIS — Z23 Encounter for immunization: Secondary | ICD-10-CM | POA: Diagnosis not present

## 2021-01-30 ENCOUNTER — Ambulatory Visit (INDEPENDENT_AMBULATORY_CARE_PROVIDER_SITE_OTHER): Payer: Medicare Other

## 2021-01-30 DIAGNOSIS — E1143 Type 2 diabetes mellitus with diabetic autonomic (poly)neuropathy: Secondary | ICD-10-CM

## 2021-01-30 DIAGNOSIS — I5022 Chronic systolic (congestive) heart failure: Secondary | ICD-10-CM

## 2021-01-30 NOTE — Chronic Care Management (AMB) (Signed)
Chronic Care Management   CCM RN Visit Note  01/30/2021 Name: Jocelyn Wells MRN: [id number] DOB: [birth year]  Subjective: Jocelyn Wells is a 65 y.o. year old female who is a primary care patient of Bacigalupo, Dionne Bucy, MD. The care management team was consulted for assistance with disease management and care coordination needs.    Engaged with patient by telephone for follow up visit in response to provider referral for case management and care coordination services.   Consent to Services:  The patient was given information about Chronic Care Management services, agreed to services, and gave verbal consent prior to initiation of services.  Please see initial visit note for detailed documentation.   Assessment: Review of patient past medical history, allergies, medications, health status, including review of consultants reports, laboratory and other test data, was performed as part of comprehensive evaluation and provision of chronic care management services.   SDOH (Social Determinants of Health) assessments and interventions performed: No  CCM Care Plan  Allergies  Allergen Reactions   Codeine Nausea And Vomiting    Outpatient Encounter Medications as of 01/30/2021  Medication Sig Note   apixaban (ELIQUIS) 5 MG TABS tablet Take 1 tablet (5 mg total) by mouth 2 (two) times daily.    aspirin EC 81 MG tablet Take 81 mg by mouth daily. Swallow whole.    atorvastatin (LIPITOR) 40 MG tablet TAKE 1 TABLET BY MOUTH EVERY DAY    B-D INS SYRINGE 0.5CC/31GX5/16 31G X 5/16" 0.5 ML MISC     denosumab (PROLIA) 60 MG/ML SOSY injection Inject 60 mg into the skin every 6 (six) months.    Dulaglutide (TRULICITY) 1.5 EG/3.1DV SOPN Inject 1.5 mg into the skin every 7 (seven) days. (Monday Nights)    gabapentin (NEURONTIN) 300 MG capsule Take 1 capsule (300 mg total) by mouth daily. (Patient taking differently: Take 300 mg by mouth 2 (two) times daily.) 08/15/2020: Reports taking one capsule every  night and being informed to take an additional capsule in the morning if needed.   glucose blood test strip OneTouch Verio strips    glyBURIDE-metformin (GLUCOVANCE) 5-500 MG per tablet Take 2 tablets by mouth 2 (two) times daily.     hydrochlorothiazide (HYDRODIURIL) 25 MG tablet Take 25 mg by mouth daily.    insulin aspart (NOVOLOG) 100 UNIT/ML injection Inject up to 100 units daily in insulin pump    Insulin Disposable Pump (OMNIPOD DASH PODS, GEN 4,) MISC USE 1 POD EACH EVERY 72    HOURS    Insulin Disposable Pump (OMNIPOD DASH PODS, GEN 4,) MISC Inject into the skin.    Insulin Human (INSULIN PUMP) SOLN Per endocrinoloy    loratadine (CLARITIN) 10 MG tablet Take 1 tablet by mouth daily.    losartan (COZAAR) 25 MG tablet Take 2 tablets (50 mg total) by mouth daily.    mupirocin ointment (BACTROBAN) 2 % mupirocin 2 % topical ointment    PARoxetine (PAXIL) 40 MG tablet Take 1 tablet (40 mg total) by mouth daily.    Vitamin D, Ergocalciferol, (DRISDOL) 1.25 MG (50000 UT) CAPS capsule Take 50,000 Units by mouth once a week. (Sundays)    No facility-administered encounter medications on file as of 01/30/2021.    Patient Active Problem List   Diagnosis Date Noted   Benign hypertensive kidney disease with chronic kidney disease 10/14/2020   Type 2 diabetes mellitus with hyperosmolar hyperglycemic state (HHS) ([place]) 05/31/2020   CKD (chronic kidney disease), stage IV ([place]) 05/31/2020  History of CVA (cerebrovascular accident) 05/31/2020   Hyperglycemia 05/31/2020   MDD (major depressive disorder), recurrent, in full remission ([place]) 04/09/2019   Compression fracture of L1 lumbar vertebra ([place]) 01/30/2019   Compression fracture of body of thoracic vertebra ([place]) 01/30/2019   Lumbar foraminal stenosis (RIGHT L1) 01/30/2019   Spinal stenosis of lumbar region with neurogenic claudication (L4-L5) 01/30/2019   Osteoporosis, post-menopausal 04/14/2018   Vitamin D deficiency 04/14/2018   Chronic  deep vein thrombosis (DVT) ([place]) 02/09/2018   Post-menopausal 11/17/2017   Aortic atherosclerosis ([place]) 11/08/2017   CVA (cerebral vascular accident) ([place]) 10/05/2017   Depression 06/12/2015   Cerebrovascular accident (CVA) due to bilateral thrombosis of carotid arteries ([place]) 06/12/2015   Excessive daytime sleepiness 06/12/2015   Nicotine dependence 04/24/2015   Snoring 04/24/2015   Sleep paralysis, recurrent isolated 04/24/2015   Hypersomnia with sleep apnea 04/24/2015   Cataplexy 04/24/2015   Morbid obesity due to excess calories ([place]) 04/24/2015   COPD (chronic obstructive pulmonary disease) ([place]) 04/24/2015   Cerebrovascular accident (CVA) due to thrombosis of left middle cerebral artery ([place]) 59/56/3875   Embolic stroke ([place]) 64/33/2951   Fatigue 04/23/2015   Type 2 diabetes mellitus ([place]) 88/41/6606   Chronic systolic heart failure ([place]) 03/25/2015   Stroke ([place]) 01/31/2015   Nausea & vomiting 01/29/2015   AKI (acute kidney injury) ([place]) 01/29/2015   CVA (cerebral infarction) 01/29/2015   Stroke (cerebrum) ([place]) 01/29/2015   Dizziness and giddiness 07/05/2014   Essential hypertension 07/05/2014   Carbuncle and furuncle 07/05/2014   BP (high blood pressure) 07/05/2014   Does not feel right 07/05/2014   Pain of perianal area 07/05/2014   Guttate psoriasis 07/05/2014   Gonalgia 07/05/2014   H/O diabetes mellitus 06/05/2014   CAFL (chronic airflow limitation) ([place]) 06/05/2014   H/O visual disturbance 06/05/2014   H/O elevated lipids 06/05/2014   H/O: HTN (hypertension) 06/05/2014   H/O: osteoarthritis 06/05/2014   Initial insomnia 06/05/2014   Anxiety, generalized 06/05/2014   Depression, major, recurrent, moderate ([place]) 06/05/2014   Microalbuminuria 12/23/2013   Long term current use of insulin ([place]) 12/23/2013   Compulsive tobacco user syndrome 12/23/2013   Type 2 diabetes mellitus with other diabetic kidney complication ([place]) 30/16/0109   Type II diabetes mellitus  with renal manifestations, uncontrolled 12/23/2013   Contusion of cheek 03/31/2009   Change in blood platelet count 06/08/2007   Blood in feces 06/01/2007   D (diarrhea) 06/01/2007   Disturbance of skin sensation 09/02/2006   Difficulty hearing 07/25/2006   Acute onset aura migraine 06/27/2006   Cephalalgia 05/27/2006   Clinical depression 10/22/2005   Diabetes mellitus type 2, uncontrolled 10/22/2005   HLD (hyperlipidemia) 10/22/2005   Arthritis, degenerative 10/22/2005   Current tobacco use 10/22/2005    Patient Care Plan: RN Care Management Plan of Care     Problem Identified: CHF, COPD, DM, HTN and Fall Risk      Long-Range Goal: Disease Progression Prevented or Minimized   Start Date: 01/30/2021  Expected End Date: 04/30/2021  Priority: High  Note:   Current Barriers:  Chronic Disease Management support and education needs related to CHF, HTN, COPD, DMII, and Fall Risk  RNCM Clinical Goal(s):  Patient will demonstrate Ongoing adherence to prescribed treatment plan for CHF, HTN, COPD, DMII, and Fall Risk through collaboration with the provider, RN Care Manager and the care team.   Interventions: 1:1 collaboration with primary care provider regarding development and update of comprehensive plan of care as evidenced by provider attestation and  co-signature Inter-disciplinary care team collaboration (see longitudinal plan of care) Evaluation of current treatment plan related to  self management and patient's adherence to plan as established by provider   Heart Failure Interventions:   Wt Readings from Last 3 Encounters:  12/08/20 230 lb (104.3 kg)  10/20/20 241 lb 11.2 oz (109.6 kg)  10/02/20 236 lb (107 kg)    Reviewed medications. Advised to continue taking medications as prescribed. Reviewed current plan for Heart Failure self management. Discussed importance of weighing daily and recording readings. Advised to notify provider for weight gain greater than 3 lbs  overnight or 5 lbs within a week. Reviewed s/sx of fluid overload.  Reviewed nutritional intake. Advised to continue monitoring sodium intake.  Reviewed worsening s/sx that require immediate medical attention   Diabetes Interventions:  Lab Results  Component Value Date   HGBA1C 10.2 (H) 05/31/2020  Reviewed medications and compliance with current treatment plan. Reports taking medications and utilizing insulin pump as prescribed.  Reviewed s/sx of hyperglycemia and hypoglycemia along with appropriate interventions. Readings continue to fluctuate. Reports taking recommended precautions and using recommended interventions when readings are out of range.  Reviewed nutritional intake and importance of complying with the recommended diet.      Patient Goals/Self-Care Activities: Take all medications as prescribed Attend all scheduled provider appointments Call pharmacy for medication refills 3-7 days in advance of running out of medications Call provider office for new concerns or questions    Follow Up Plan:   Will follow up next month      PLAN A member of the care management team will follow up next month.   Cristy Friedlander Health/[facility] Care Management [facility] [phone]

## 2021-02-11 ENCOUNTER — Ambulatory Visit: Payer: Self-pay

## 2021-02-11 NOTE — Telephone Encounter (Signed)
Chief Complaint: COVID Symptoms: congestion, runny nose, cough, fatigue, headache Frequency: 3 days Pertinent Negatives: Patient denies SOB Disposition: [] ED /[] Urgent Care (no appt availability in office) / [x] Appointment(In office/virtual)/ []  [facility]/ [] Home Care/ [] Refused Recommended Disposition /[]  Mobile Bus/ []  Follow-up with PCP Additional Notes: Pt advised to continue taking OTC meds and appt schedule via telephone on 02/13/21 at 0900. Care advice given and pt verbalized understanding. No other questions/concerns noted.      Summary: covid positive feeling awful   Pt is covid positive.  Has congestion, runny nose, cough, fatigue, headache, feels awful like she was hit by [employer] [facility] truck, vomited yesterday, no appetite.  Would like anti viral  [id number] [place], [place] - [address]      Reason for Disposition  [1] DIYME-15 diagnosed by positive lab test (e.g., PCR, rapid self-test kit) AND [2] mild symptoms (e.g., cough, fever, others) AND [8] no complications or SOB  Answer Assessment - Initial Assessment Questions 1. COVID-19 DIAGNOSIS: "Who made your COVID-19 diagnosis?" "Was it confirmed by a positive lab test or self-test?" If not diagnosed by a doctor (or NP/PA), ask "Are there lots of cases (community spread) where you live?" Note: See public health department website, if unsure.     Home test Monday  3. ONSET: "When did the COVID-19 symptoms start?"      Sunday  5. COUGH: "Do you have a cough?" If Yes, ask: "How bad is the cough?"       yes 6. FEVER: "Do you have a fever?" If Yes, ask: "What is your temperature, how was it measured, and when did it start?"     no 10. VACCINE: "Have you had the COVID-19 vaccine?" If Yes, ask: "Which one, how many shots, when did you get it?"       yes 11. BOOSTER: "Have you received your COVID-19 booster?" If Yes, ask: "Which one and when did you get it?"       yes  13. OTHER SYMPTOMS: "Do you  have any other symptoms?"  (e.g., chills, fatigue, headache, loss of smell or taste, muscle pain, sore throat)       Headache, fatigue, runny nose, congestion  Protocols used: Coronavirus (COVID-19) Diagnosed or Suspected-A-AH

## 2021-02-13 ENCOUNTER — Encounter: Payer: Self-pay | Admitting: Family Medicine

## 2021-02-13 ENCOUNTER — Other Ambulatory Visit: Payer: Self-pay

## 2021-02-13 ENCOUNTER — Ambulatory Visit (INDEPENDENT_AMBULATORY_CARE_PROVIDER_SITE_OTHER): Payer: Medicare Other | Admitting: Family Medicine

## 2021-02-13 ENCOUNTER — Ambulatory Visit (INDEPENDENT_AMBULATORY_CARE_PROVIDER_SITE_OTHER): Payer: Medicare Other

## 2021-02-13 DIAGNOSIS — B9789 Other viral agents as the cause of diseases classified elsewhere: Secondary | ICD-10-CM

## 2021-02-13 DIAGNOSIS — J019 Acute sinusitis, unspecified: Secondary | ICD-10-CM | POA: Diagnosis not present

## 2021-02-13 DIAGNOSIS — U071 COVID-19: Secondary | ICD-10-CM

## 2021-02-13 DIAGNOSIS — R051 Acute cough: Secondary | ICD-10-CM

## 2021-02-13 DIAGNOSIS — F330 Major depressive disorder, recurrent, mild: Secondary | ICD-10-CM | POA: Diagnosis not present

## 2021-02-13 DIAGNOSIS — N184 Chronic kidney disease, stage 4 (severe): Secondary | ICD-10-CM

## 2021-02-13 DIAGNOSIS — R110 Nausea: Secondary | ICD-10-CM

## 2021-02-13 DIAGNOSIS — J449 Chronic obstructive pulmonary disease, unspecified: Secondary | ICD-10-CM | POA: Diagnosis not present

## 2021-02-13 DIAGNOSIS — R062 Wheezing: Secondary | ICD-10-CM

## 2021-02-13 DIAGNOSIS — J398 Other specified diseases of upper respiratory tract: Secondary | ICD-10-CM

## 2021-02-13 DIAGNOSIS — R630 Anorexia: Secondary | ICD-10-CM

## 2021-02-13 DIAGNOSIS — I82511 Chronic embolism and thrombosis of right femoral vein: Secondary | ICD-10-CM

## 2021-02-13 DIAGNOSIS — I5022 Chronic systolic (congestive) heart failure: Secondary | ICD-10-CM

## 2021-02-13 DIAGNOSIS — E1129 Type 2 diabetes mellitus with other diabetic kidney complication: Secondary | ICD-10-CM

## 2021-02-13 MED ORDER — OPTICHAMBER DIAMOND MISC: 0 refills | Status: AC

## 2021-02-13 MED ORDER — GUAIFENESIN-DM 100-10 MG/5ML PO SYRP: 10.0000 mL | ORAL_SOLUTION | ORAL | 1 refills | Status: DC | PRN

## 2021-02-13 MED ORDER — FLUTICASONE PROPIONATE 50 MCG/ACT NA SUSP: 2.0000 | Freq: Every day | NASAL | 6 refills | Status: AC

## 2021-02-13 MED ORDER — ALBUTEROL SULFATE HFA 108 (90 BASE) MCG/ACT IN AERS: 2.0000 | INHALATION_SPRAY | Freq: Four times a day (QID) | RESPIRATORY_TRACT | 2 refills | Status: DC | PRN

## 2021-02-13 MED ORDER — ONDANSETRON HCL 4 MG PO TABS: 4.0000 mg | ORAL_TABLET | Freq: Three times a day (TID) | ORAL | 0 refills | Status: DC | PRN

## 2021-02-13 NOTE — Assessment & Plan Note (Signed)
Encourage use of anti histamines and nasal steroids to assist with covid symptoms

## 2021-02-13 NOTE — Assessment & Plan Note (Signed)
D/t congestion from COVID Cough syrup provided to assist

## 2021-02-13 NOTE — Chronic Care Management (AMB) (Signed)
Chronic Care Management   CCM RN Visit Note  02/13/2021 Name: Jocelyn Wells MRN: [id number] DOB: 1955/11/03  Subjective: Jocelyn Wells is a 66 y.o. year old female who is a primary care patient of Bacigalupo, Dionne Bucy, MD. The care management team was consulted for assistance with disease management and care coordination needs.    Engaged with patient by telephone for follow up visit in response to provider referral for case management and care coordination services.   Consent to Services:  The patient was given information about Chronic Care Management services, agreed to services, and gave verbal consent prior to initiation of services.  Please see initial visit note for detailed documentation.    Assessment: Review of patient past medical history, allergies, medications, health status, including review of consultants reports, laboratory and other test data, was performed as part of comprehensive evaluation and provision of chronic care management services.   SDOH (Social Determinants of Health) assessments and interventions performed: No   CCM Care Plan  Allergies  Allergen Reactions   Codeine Nausea And Vomiting    Outpatient Encounter Medications as of 02/13/2021  Medication Sig Note   albuterol (VENTOLIN HFA) 108 (90 Base) MCG/ACT inhaler Inhale 2 puffs into the lungs every 6 (six) hours as needed for wheezing or shortness of breath.    apixaban (ELIQUIS) 5 MG TABS tablet Take 1 tablet (5 mg total) by mouth 2 (two) times daily.    aspirin EC 81 MG tablet Take 81 mg by mouth daily. Swallow whole.    atorvastatin (LIPITOR) 40 MG tablet TAKE 1 TABLET BY MOUTH EVERY DAY    B-D INS SYRINGE 0.5CC/31GX5/16 31G X 5/16" 0.5 ML MISC     denosumab (PROLIA) 60 MG/ML SOSY injection Inject 60 mg into the skin every 6 (six) months.    Dulaglutide (TRULICITY) 1.5 LF/8.1OF SOPN Inject 1.5 mg into the skin every 7 (seven) days. (Monday Nights)    fluticasone (FLONASE) 50 MCG/ACT nasal spray  Place 2 sprays into both nostrils daily.    gabapentin (NEURONTIN) 300 MG capsule Take 1 capsule (300 mg total) by mouth daily. (Patient taking differently: Take 300 mg by mouth 2 (two) times daily.) 08/15/2020: Reports taking one capsule every night and being informed to take an additional capsule in the morning if needed.   glucose blood test strip OneTouch Verio strips    glyBURIDE-metformin (GLUCOVANCE) 5-500 MG per tablet Take 2 tablets by mouth 2 (two) times daily.     guaiFENesin-dextromethorphan (ROBITUSSIN DM) 100-10 MG/5ML syrup Take 10 mLs by mouth every 4 (four) hours as needed for cough.    hydrochlorothiazide (HYDRODIURIL) 25 MG tablet Take 25 mg by mouth daily.    insulin aspart (NOVOLOG) 100 UNIT/ML injection Inject up to 100 units daily in insulin pump    Insulin Disposable Pump (OMNIPOD DASH PODS, GEN 4,) MISC USE 1 POD EACH EVERY 72    HOURS    Insulin Disposable Pump (OMNIPOD DASH PODS, GEN 4,) MISC Inject into the skin.    Insulin Human (INSULIN PUMP) SOLN Per endocrinoloy    loratadine (CLARITIN) 10 MG tablet Take 1 tablet by mouth daily.    losartan (COZAAR) 25 MG tablet Take 2 tablets (50 mg total) by mouth daily.    mupirocin ointment (BACTROBAN) 2 % mupirocin 2 % topical ointment    ondansetron (ZOFRAN) 4 MG tablet Take 1 tablet (4 mg total) by mouth every 8 (eight) hours as needed for nausea or vomiting.    PARoxetine (  PAXIL) 40 MG tablet Take 1 tablet (40 mg total) by mouth daily.    Spacer/Aero-Holding Chambers [facility] DIAMOND) MISC Use with inhaler to ensure medication is delivered throughout lungs    traMADol (ULTRAM) 50 MG tablet Take 50 mg by mouth 2 (two) times daily as needed.    Vitamin D, Ergocalciferol, (DRISDOL) 1.25 MG (50000 UT) CAPS capsule Take 50,000 Units by mouth once a week. (Sundays)    No facility-administered encounter medications on file as of 02/13/2021.    Patient Active Problem List   Diagnosis Date Noted   Acute cough 02/13/2021    COVID-19 02/13/2021   Acute viral sinusitis 02/13/2021   Poor appetite 02/13/2021   Nausea 02/13/2021   Wheezing 02/13/2021   Congestion of upper respiratory tract 02/13/2021   Benign hypertensive kidney disease with chronic kidney disease 10/14/2020   Type 2 diabetes mellitus with hyperosmolar hyperglycemic state (HHS) ([place]) 05/31/2020   CKD (chronic kidney disease), stage IV ([place]) 05/31/2020   History of CVA (cerebrovascular accident) 05/31/2020   Hyperglycemia 05/31/2020   MDD (major depressive disorder), recurrent, in full remission ([place]) 04/09/2019   Compression fracture of L1 lumbar vertebra ([place]) 01/30/2019   Compression fracture of body of thoracic vertebra ([place]) 01/30/2019   Lumbar foraminal stenosis (RIGHT L1) 01/30/2019   Spinal stenosis of lumbar region with neurogenic claudication (L4-L5) 01/30/2019   Osteoporosis, post-menopausal 04/14/2018   Vitamin D deficiency 04/14/2018   Chronic deep vein thrombosis (DVT) ([place]) 02/09/2018   Post-menopausal 11/17/2017   Aortic atherosclerosis ([place]) 11/08/2017   CVA (cerebral vascular accident) ([place]) 10/05/2017   Depression 06/12/2015   Cerebrovascular accident (CVA) due to bilateral thrombosis of carotid arteries ([place]) 06/12/2015   Excessive daytime sleepiness 06/12/2015   Nicotine dependence 04/24/2015   Snoring 04/24/2015   Sleep paralysis, recurrent isolated 04/24/2015   Hypersomnia with sleep apnea 04/24/2015   Cataplexy 04/24/2015   Morbid obesity due to excess calories ([place]) 04/24/2015   COPD (chronic obstructive pulmonary disease) ([place]) 04/24/2015   Cerebrovascular accident (CVA) due to thrombosis of left middle cerebral artery ([place]) 35/32/9924   Embolic stroke ([place]) 26/83/4196   Fatigue 04/23/2015   Type 2 diabetes mellitus ([place]) 22/29/7989   Chronic systolic heart failure ([place]) 03/25/2015   Stroke ([place]) 01/31/2015   Nausea & vomiting 01/29/2015   AKI (acute kidney injury) ([place]) 01/29/2015   CVA (cerebral infarction)  01/29/2015   Stroke (cerebrum) ([place]) 01/29/2015   Dizziness and giddiness 07/05/2014   Essential hypertension 07/05/2014   Carbuncle and furuncle 07/05/2014   BP (high blood pressure) 07/05/2014   Does not feel right 07/05/2014   Pain of perianal area 07/05/2014   Guttate psoriasis 07/05/2014   Gonalgia 07/05/2014   H/O diabetes mellitus 06/05/2014   CAFL (chronic airflow limitation) ([place]) 06/05/2014   H/O visual disturbance 06/05/2014   H/O elevated lipids 06/05/2014   H/O: HTN (hypertension) 06/05/2014   H/O: osteoarthritis 06/05/2014   Initial insomnia 06/05/2014   Anxiety, generalized 06/05/2014   Depression, major, recurrent, moderate ([place]) 06/05/2014   Microalbuminuria 12/23/2013   Long term current use of insulin ([place]) 12/23/2013   Compulsive tobacco user syndrome 12/23/2013   Type 2 diabetes mellitus with other diabetic kidney complication ([place]) 21/19/4174   Type II diabetes mellitus with renal manifestations, uncontrolled 12/23/2013   Contusion of cheek 03/31/2009   Change in blood platelet count 06/08/2007   Blood in feces 06/01/2007   D (diarrhea) 06/01/2007   Disturbance of skin sensation 09/02/2006   Difficulty hearing 07/25/2006   Acute  onset aura migraine 06/27/2006   Cephalalgia 05/27/2006   Clinical depression 10/22/2005   Diabetes mellitus type 2, uncontrolled 10/22/2005   HLD (hyperlipidemia) 10/22/2005   Arthritis, degenerative 10/22/2005   Current tobacco use 10/22/2005     Patient Care Plan: RN Care Management Plan of Care     Problem Identified: CHF, COPD, DM, HTN and Fall Risk      Long-Range Goal: Disease Progression Prevented or Minimized   Start Date: 01/30/2021  Expected End Date: 04/30/2021  Priority: High  Note:   Current Barriers:  Chronic Disease Management support and education needs related to CHF, HTN, COPD, DMII, and Fall Risk  RNCM Clinical Goal(s):  Patient will demonstrate Ongoing adherence to prescribed treatment plan for  CHF, HTN, COPD, DMII, and Fall Risk through collaboration with the provider, RN Care Manager and the care team.   Interventions: 1:1 collaboration with primary care provider regarding development and update of comprehensive plan of care as evidenced by provider attestation and co-signature Inter-disciplinary care team collaboration (see longitudinal plan of care) Evaluation of current treatment plan related to  self management and patient's adherence to plan as established by provider   Heart Failure Interventions:   Reviewed medications. Advised to continue taking medications as prescribed. Reviewed current plan for Heart Failure self management. Discussed importance of weighing daily and recording readings. Advised to notify provider for weight gain greater than 3 lbs overnight or 5 lbs within a week. Reviewed s/sx of fluid overload.  Reviewed nutritional intake. Advised to continue monitoring sodium intake.  Reviewed worsening s/sx that require immediate medical attention   Diabetes Interventions:  Reviewed medications and compliance with current treatment plan. Reports taking medications and utilizing insulin pump as prescribed.  Reviewed s/sx of hyperglycemia and hypoglycemia along with appropriate interventions. Readings continue to fluctuate. Reports taking recommended precautions and using recommended interventions when readings are out of range.  Reviewed nutritional intake and importance of complying with the recommended diet.    COPD Interventions: Reviewed medications and COPD management.  Discussed recent COVID diagnosis. Completed acute outreach with a primary provider today. Confirmed receipt of her new prescriptions. Reviewed medications and indications for use. Advised to take as prescribed. Discussed symptoms. Reports improvement in symptoms since yesterday. She has a productive cough. She recalls thick yellow and green mucous when symptoms started. She remains fatigued.  Denies shortness of breath at time of call. Advised to continue plan for COPD and Covid management. Discussed importance of monitoring symptoms daily. Discussed increased risk for complications d/t COPD. Thoroughly reviewed worsening symptoms that require immediate medical attention.  Patient Goals/Self-Care Activities: Take all medications as prescribed Attend all scheduled provider appointments Call pharmacy for medication refills 3-7 days in advance of running out of medications Call provider office for new concerns or questions    Follow Up Plan:   Will follow up next month      PLAN: A member of the care management team will follow up next month.   Cristy [facility] Care Management [facility] [phone]

## 2021-02-13 NOTE — Assessment & Plan Note (Signed)
Chronic, stable Wheezing heard on phone PRN inhaler provider with spacer

## 2021-02-13 NOTE — Assessment & Plan Note (Signed)
Chronic, stable Report in decreased appetite d/t congestion  Continue to recommend balanced, lower carb meals. Smaller meal size, adding snacks. Choosing water as drink of choice and increasing purposeful exercise.

## 2021-02-13 NOTE — Assessment & Plan Note (Signed)
Trial of zofran to assist with symptoms

## 2021-02-13 NOTE — Patient Instructions (Signed)
Thank you for allowing the Chronic Care Management team to participate in your care. It was great speaking with you today! °

## 2021-02-13 NOTE — Assessment & Plan Note (Signed)
PRN inhaler and spacer provided

## 2021-02-13 NOTE — Assessment & Plan Note (Signed)
Discussed increased risk for clots d/t previous hx with known increased risk for clots r/t COVID encouraged ambulation to best of ability

## 2021-02-13 NOTE — Assessment & Plan Note (Signed)
Denies self harm; does not feel that she needs ER/inpatient stay. Advised to call 911 over weekend and call PCP Monday 1/9 for f/u

## 2021-02-13 NOTE — Assessment & Plan Note (Signed)
D/t covid; encouraged to not skip meals d/t risk of further increasing blood sugars

## 2021-02-13 NOTE — Assessment & Plan Note (Signed)
Chronic, stable Wheezing heard over phone Pt denies edema

## 2021-02-13 NOTE — Assessment & Plan Note (Signed)
Push water and overall fluid intake to prevent dehydration and mucus plug

## 2021-02-13 NOTE — Assessment & Plan Note (Signed)
Unable to decipher BG levels as sensor has fallen off; has contacted company to get another sensor Encourage increase water intake to help with lowering blood glucose levels during this time of illness

## 2021-02-13 NOTE — Assessment & Plan Note (Signed)
Unable to take antiviral therapy with known CKG stage 4 encourage PO intake to prevent AKI

## 2021-02-13 NOTE — Assessment & Plan Note (Signed)
Has had for 5 days Unable to take antiviral Encouraged to go to ER given high risk for complications given chronic conditions and risk for complications- pt refused; "I'm not that sick." Pt later noted in conversation that her husband is going out of town Friday PM through Sunday- however, she has other family members that are close by as well as friends. I advised her if anything was to happen and she couldn't get hold of someone on the first time she needed to call 9-1-1. Pt agreed and contracted to given her previous health hx.  Unsure of DM numbers given lack of sensor- encourage PO intake; known CKD- encourage PO intake. Active wheezing- provide inhaler with spacer. Add flonase to assist with upp resp concerns. Cough syrup with decongestant provided for cough and congestion. Non drowsy anti nausea medication provided; likely nausea r/t excessive congestion and comorbid conditions. Unable to check SPO2. Active Covid Home monitoring.

## 2021-02-13 NOTE — Progress Notes (Signed)
Virtual telephone visit    Virtual Visit via Telephone Note   This visit type was conducted due to national recommendations for restrictions regarding the COVID-19 Pandemic (e.g. social distancing) in an effort to limit this patient's exposure and mitigate transmission in our community. Due to her co-morbid illnesses, this patient is at least at moderate risk for complications without adequate follow up. This format is felt to be most appropriate for this patient at this time. The patient did not have access to video technology or had technical difficulties with video requiring transitioning to audio format only (telephone). Physical exam was limited to content and character of the telephone converstion.    Patient location: home Provider location: [facility]  I discussed the limitations of evaluation and management by telemedicine and the availability of in person appointments. The patient expressed understanding and agreed to proceed.   Visit Date: 02/13/2021  Today's healthcare provider: Gwyneth Sprout, FNP   Chief Complaint  Patient presents with   Covid Positive   Subjective    HPI HPI   Patient presents in office today after testing positive at home for covid on 02/09/21, patient states that symptoms began on 02/08/21. Patient complains of headache, cough, dyspnea, wheezing, nausea/vomiting, loss of appetite, myalgia and body chills. Patient has tried taking otc [employer] bran Tylenol Cold & flu, cough suppressant and tylenol arthritis relief.  Last edited by Minette Headland, CMA on 02/13/2021  9:23 AM.       Medications: Outpatient Medications Prior to Visit  Medication Sig   apixaban (ELIQUIS) 5 MG TABS tablet Take 1 tablet (5 mg total) by mouth 2 (two) times daily.   aspirin EC 81 MG tablet Take 81 mg by mouth daily. Swallow whole.   atorvastatin (LIPITOR) 40 MG tablet TAKE 1 TABLET BY MOUTH EVERY DAY   B-D INS SYRINGE 0.5CC/31GX5/16 31G X 5/16" 0.5 ML  MISC    denosumab (PROLIA) 60 MG/ML SOSY injection Inject 60 mg into the skin every 6 (six) months.   Dulaglutide (TRULICITY) 1.5 FU/9.3AT SOPN Inject 1.5 mg into the skin every 7 (seven) days. (Monday Nights)   glucose blood test strip OneTouch Verio strips   glyBURIDE-metformin (GLUCOVANCE) 5-500 MG per tablet Take 2 tablets by mouth 2 (two) times daily.    hydrochlorothiazide (HYDRODIURIL) 25 MG tablet Take 25 mg by mouth daily.   insulin aspart (NOVOLOG) 100 UNIT/ML injection Inject up to 100 units daily in insulin pump   Insulin Disposable Pump (OMNIPOD DASH PODS, GEN 4,) MISC USE 1 POD EACH EVERY 72    HOURS   Insulin Disposable Pump (OMNIPOD DASH PODS, GEN 4,) MISC Inject into the skin.   Insulin Human (INSULIN PUMP) SOLN Per endocrinoloy   loratadine (CLARITIN) 10 MG tablet Take 1 tablet by mouth daily.   losartan (COZAAR) 25 MG tablet Take 2 tablets (50 mg total) by mouth daily.   mupirocin ointment (BACTROBAN) 2 % mupirocin 2 % topical ointment   PARoxetine (PAXIL) 40 MG tablet Take 1 tablet (40 mg total) by mouth daily.   traMADol (ULTRAM) 50 MG tablet Take 50 mg by mouth 2 (two) times daily as needed.   Vitamin D, Ergocalciferol, (DRISDOL) 1.25 MG (50000 UT) CAPS capsule Take 50,000 Units by mouth once a week. (Sundays)   gabapentin (NEURONTIN) 300 MG capsule Take 1 capsule (300 mg total) by mouth daily. (Patient taking differently: Take 300 mg by mouth 2 (two) times daily.)   No facility-administered medications prior to  visit.    Review of Systems    Objective    There were no vitals taken for this visit.     Assessment & Plan     Problem List Items Addressed This Visit       Cardiovascular and Mediastinum   Chronic systolic heart failure (HCC)    Chronic, stable Wheezing heard over phone Pt denies edema       Chronic deep vein thrombosis (DVT) (HCC)    Discussed increased risk for clots d/t previous hx with known increased risk for clots r/t  COVID encouraged ambulation to best of ability         Respiratory   COPD (chronic obstructive pulmonary disease) (HCC)    Chronic, stable Wheezing heard on phone PRN inhaler provider with spacer      Relevant Medications   albuterol (VENTOLIN HFA) 108 (90 Base) MCG/ACT inhaler   guaiFENesin-dextromethorphan (ROBITUSSIN DM) 100-10 MG/5ML syrup   fluticasone (FLONASE) 50 MCG/ACT nasal spray   Acute viral sinusitis    Encourage use of anti histamines and nasal steroids to assist with covid symptoms       Relevant Medications   guaiFENesin-dextromethorphan (ROBITUSSIN DM) 100-10 MG/5ML syrup   fluticasone (FLONASE) 50 MCG/ACT nasal spray   Congestion of upper respiratory tract    Push water and overall fluid intake to prevent dehydration and mucus plug      Relevant Medications   fluticasone (FLONASE) 50 MCG/ACT nasal spray     Endocrine   Type 2 diabetes mellitus with other diabetic kidney complication (HCC)    Unable to decipher BG levels as sensor has fallen off; has contacted company to get another sensor Encourage increase water intake to help with lowering blood glucose levels during this time of illness        Genitourinary   CKD (chronic kidney disease), stage IV (HCC)    Unable to take antiviral therapy with known CKG stage 4 encourage PO intake to prevent AKI        Other   Morbid obesity due to excess calories (HCC)    Chronic, stable Report in decreased appetite d/t congestion  Continue to recommend balanced, lower carb meals. Smaller meal size, adding snacks. Choosing water as drink of choice and increasing purposeful exercise.       Acute cough    D/t congestion from COVID Cough syrup provided to assist       Relevant Medications   guaiFENesin-dextromethorphan (ROBITUSSIN DM) 100-10 MG/5ML syrup   COVID-19 - Primary    Has had for 5 days Unable to take antiviral Encouraged to go to ER given high risk for complications given chronic conditions  and risk for complications- pt refused; "I'm not that sick." Pt later noted in conversation that her husband is going out of town Friday PM through Sunday- however, she has other family members that are close by as well as friends. I advised her if anything was to happen and she couldn't get hold of someone on the first time she needed to call 9-1-1. Pt agreed and contracted to given her previous health hx.  Unsure of DM numbers given lack of sensor- encourage PO intake; known CKD- encourage PO intake. Active wheezing- provide inhaler with spacer. Add flonase to assist with upp resp concerns. Cough syrup with decongestant provided for cough and congestion. Non drowsy anti nausea medication provided; likely nausea r/t excessive congestion and comorbid conditions. Unable to check SPO2. Active Covid Home monitoring.  Relevant Orders   MyChart COVID-19 home monitoring program   Poor appetite    D/t covid; encouraged to not skip meals d/t risk of further increasing blood sugars      Nausea    Trial of zofran to assist with symptoms      Relevant Medications   ondansetron (ZOFRAN) 4 MG tablet   Wheezing    PRN inhaler and spacer provided      Relevant Medications   albuterol (VENTOLIN HFA) 108 (90 Base) MCG/ACT inhaler   Spacer/Aero-Holding Chambers (OPTICHAMBER DIAMOND) MISC   MDD (major depressive disorder), recurrent episode, mild ([place])    Denies self harm; does not feel that she needs ER/inpatient stay. Advised to call 911 over weekend and call PCP Monday 1/9 for f/u        Return in about 1 week (around 02/20/2021), or if symptoms worsen or fail to improve- call 911.    I discussed the assessment and treatment plan with the patient. The patient was provided an opportunity to ask questions and all were answered. The patient agreed with the plan and demonstrated an understanding of the instructions.   The patient was advised to call back or seek an in-person evaluation if the  symptoms worsen or if the condition fails to improve as anticipated.  I provided 13 minutes of non-face-to-face time during this encounter.  Vonna Kotyk, FNP, have reviewed all documentation for this visit. The documentation on 02/13/21 for the exam, diagnosis, procedures, and orders are all accurate and complete.   Gwyneth Sprout, [place] [phone] (phone) [phone] (fax)  [place]

## 2021-02-17 ENCOUNTER — Telehealth: Payer: Self-pay | Admitting: Family Medicine

## 2021-02-17 ENCOUNTER — Telehealth: Payer: Self-pay | Admitting: *Deleted

## 2021-02-17 NOTE — Telephone Encounter (Signed)
See update below. KW

## 2021-02-17 NOTE — Telephone Encounter (Signed)
Patient was told to call with report of how she is feeling with her COVID  Patient is calling to report the fluticasone (FLONASE) 50 MCG/ACT nasal spray is helping symptoms. Patient states she has started wheezing today- she has not had to use the prescribed inhaler so far- but will try that. Patient was taking cough syrup and Tylenol-Cold and Sinus- but has not needed for couple days. Cough- better Breathing- today feels more SOB- no tightness/pain in chest, hears wheezing in am after sleeping- clears up during the day as mores congestion out, sometimes SOB with exertion Fever- none Body aches- not bad- does have chills at times Fatigue-feels sleepy all the time-slept all day Saturday Appetite- not hungry, food not appealing- patient is drinking fluids, small snacks Patient advised will send message to provider and will call back with further instructions. Reviewed diet, rest, isolation, treatment of symptoms. Patient also advised to call back with any changes

## 2021-02-17 NOTE — Telephone Encounter (Signed)
Patient called in stes she is doing well with nose drops, but hasnt used the albuterol yet,also says she had wheezing today so I got her over to NT for that,

## 2021-02-18 NOTE — Telephone Encounter (Signed)
LMTCB

## 2021-02-18 NOTE — Telephone Encounter (Signed)
Patient aware.

## 2021-03-02 ENCOUNTER — Ambulatory Visit: Payer: Medicare Other | Admitting: Family Medicine

## 2021-03-05 ENCOUNTER — Telehealth: Payer: Self-pay | Admitting: *Deleted

## 2021-03-05 NOTE — Chronic Care Management (AMB) (Signed)
°  Care Management   Note  03/05/2021 Name: [name] MRN: [id number] DOB: 1955-11-21  Jocelyn Wells is a 66 y.o. year old female who is a primary care patient of Brita Romp, Dionne Bucy, MD and is actively engaged with the care management team. I reached out to Jocelyn Wells by phone today to assist with re-scheduling a follow up visit with the RN Case Manager  Follow up plan: Unsuccessful telephone outreach attempt made. A HIPAA compliant phone message was left for the patient providing contact information and requesting a return call.   Julian Hy, [place] Management  Direct Dial: [phone]

## 2021-03-09 ENCOUNTER — Telehealth: Payer: Medicare Other

## 2021-03-09 NOTE — Chronic Care Management (AMB) (Signed)
°  Care Management   Note  03/09/2021 Name: EDDYE BROXTERMAN MRN: [id number] DOB: 21-Jan-1956  Jocelyn Wells is a 66 y.o. year old female who is a primary care patient of Brita Romp, Dionne Bucy, MD and is actively engaged with the care management team. I reached out to Jocelyn Wells by phone today to assist with re-scheduling a follow up visit with the RN Case Manager  Follow up plan: Telephone appointment with care management team member scheduled for: 03/16/2021  Julian Hy, [place], [place] Management  Direct Dial: [phone]

## 2021-03-10 DIAGNOSIS — J449 Chronic obstructive pulmonary disease, unspecified: Secondary | ICD-10-CM | POA: Diagnosis not present

## 2021-03-16 ENCOUNTER — Ambulatory Visit (INDEPENDENT_AMBULATORY_CARE_PROVIDER_SITE_OTHER): Payer: Medicare Other

## 2021-03-16 DIAGNOSIS — E1129 Type 2 diabetes mellitus with other diabetic kidney complication: Secondary | ICD-10-CM

## 2021-03-16 DIAGNOSIS — I5022 Chronic systolic (congestive) heart failure: Secondary | ICD-10-CM

## 2021-03-16 DIAGNOSIS — J449 Chronic obstructive pulmonary disease, unspecified: Secondary | ICD-10-CM

## 2021-03-16 NOTE — Patient Instructions (Signed)
Thank you for allowing the Chronic Care Management team to participate in your care. It was great speaking with you today! °

## 2021-03-16 NOTE — Chronic Care Management (AMB) (Signed)
Chronic Care Management   CCM RN Visit Note  03/16/2021 Name: Jocelyn Wells MRN: [id number] DOB: November 19, 1955  Subjective: Jocelyn Wells is a 66 y.o. year old female who is a primary care patient of Bacigalupo, Dionne Bucy, MD. The care management team was consulted for assistance with disease management and care coordination needs.    Engaged with patient by telephone for follow up visit in response to provider referral for case management and care coordination services.   Consent to Services:  The patient was given information about Chronic Care Management services, agreed to services, and gave verbal consent prior to initiation of services.  Please see initial visit note for detailed documentation.   Assessment: Review of patient past medical history, allergies, medications, health status, including review of consultants reports, laboratory and other test data, was performed as part of comprehensive evaluation and provision of chronic care management services.   SDOH (Social Determinants of Health) assessments and interventions performed: No  CCM Care Plan  Allergies  Allergen Reactions   Codeine Nausea And Vomiting    Outpatient Encounter Medications as of 03/16/2021  Medication Sig Note   albuterol (VENTOLIN HFA) 108 (90 Base) MCG/ACT inhaler Inhale 2 puffs into the lungs every 6 (six) hours as needed for wheezing or shortness of breath.    apixaban (ELIQUIS) 5 MG TABS tablet Take 1 tablet (5 mg total) by mouth 2 (two) times daily.    aspirin EC 81 MG tablet Take 81 mg by mouth daily. Swallow whole.    atorvastatin (LIPITOR) 40 MG tablet TAKE 1 TABLET BY MOUTH EVERY DAY    B-D INS SYRINGE 0.5CC/31GX5/16 31G X 5/16" 0.5 ML MISC     denosumab (PROLIA) 60 MG/ML SOSY injection Inject 60 mg into the skin every 6 (six) months.    Dulaglutide (TRULICITY) 1.5 NG/2.9BM SOPN Inject 1.5 mg into the skin every 7 (seven) days. (Monday Nights)    fluticasone (FLONASE) 50 MCG/ACT nasal spray Place  2 sprays into both nostrils daily.    gabapentin (NEURONTIN) 300 MG capsule Take 1 capsule (300 mg total) by mouth daily. (Patient taking differently: Take 300 mg by mouth 2 (two) times daily.) 08/15/2020: Reports taking one capsule every night and being informed to take an additional capsule in the morning if needed.   glucose blood test strip OneTouch Verio strips    glyBURIDE-metformin (GLUCOVANCE) 5-500 MG per tablet Take 2 tablets by mouth 2 (two) times daily.     guaiFENesin-dextromethorphan (ROBITUSSIN DM) 100-10 MG/5ML syrup Take 10 mLs by mouth every 4 (four) hours as needed for cough.    hydrochlorothiazide (HYDRODIURIL) 25 MG tablet Take 25 mg by mouth daily.    insulin aspart (NOVOLOG) 100 UNIT/ML injection Inject up to 100 units daily in insulin pump    Insulin Disposable Pump (OMNIPOD DASH PODS, GEN 4,) MISC USE 1 POD EACH EVERY 72    HOURS    Insulin Disposable Pump (OMNIPOD DASH PODS, GEN 4,) MISC Inject into the skin.    Insulin Human (INSULIN PUMP) SOLN Per endocrinoloy    loratadine (CLARITIN) 10 MG tablet Take 1 tablet by mouth daily.    losartan (COZAAR) 25 MG tablet Take 2 tablets (50 mg total) by mouth daily.    mupirocin ointment (BACTROBAN) 2 % mupirocin 2 % topical ointment    ondansetron (ZOFRAN) 4 MG tablet Take 1 tablet (4 mg total) by mouth every 8 (eight) hours as needed for nausea or vomiting.    PARoxetine (PAXIL) 40  MG tablet Take 1 tablet (40 mg total) by mouth daily.    Spacer/Aero-Holding Chambers [facility] DIAMOND) MISC Use with inhaler to ensure medication is delivered throughout lungs    traMADol (ULTRAM) 50 MG tablet Take 50 mg by mouth 2 (two) times daily as needed.    Vitamin D, Ergocalciferol, (DRISDOL) 1.25 MG (50000 UT) CAPS capsule Take 50,000 Units by mouth once a week. (Sundays)    No facility-administered encounter medications on file as of 03/16/2021.    Patient Active Problem List   Diagnosis Date Noted   Acute cough 02/13/2021   COVID-19  02/13/2021   Acute viral sinusitis 02/13/2021   Poor appetite 02/13/2021   Nausea 02/13/2021   Wheezing 02/13/2021   Congestion of upper respiratory tract 02/13/2021   MDD (major depressive disorder), recurrent episode, mild ([place]) 02/13/2021   Benign hypertensive kidney disease with chronic kidney disease 10/14/2020   Type 2 diabetes mellitus with hyperosmolar hyperglycemic state (HHS) ([place]) 05/31/2020   CKD (chronic kidney disease), stage IV ([place]) 05/31/2020   History of CVA (cerebrovascular accident) 05/31/2020   Hyperglycemia 05/31/2020   MDD (major depressive disorder), recurrent, in full remission ([place]) 04/09/2019   Compression fracture of L1 lumbar vertebra ([place]) 01/30/2019   Compression fracture of body of thoracic vertebra ([place]) 01/30/2019   Lumbar foraminal stenosis (RIGHT L1) 01/30/2019   Spinal stenosis of lumbar region with neurogenic claudication (L4-L5) 01/30/2019   Osteoporosis, post-menopausal 04/14/2018   Vitamin D deficiency 04/14/2018   Chronic deep vein thrombosis (DVT) ([place]) 02/09/2018   Post-menopausal 11/17/2017   Aortic atherosclerosis ([place]) 11/08/2017   CVA (cerebral vascular accident) ([place]) 10/05/2017   Depression 06/12/2015   Cerebrovascular accident (CVA) due to bilateral thrombosis of carotid arteries ([place]) 06/12/2015   Excessive daytime sleepiness 06/12/2015   Nicotine dependence 04/24/2015   Snoring 04/24/2015   Sleep paralysis, recurrent isolated 04/24/2015   Hypersomnia with sleep apnea 04/24/2015   Cataplexy 04/24/2015   Morbid obesity due to excess calories ([place]) 04/24/2015   COPD (chronic obstructive pulmonary disease) ([place]) 04/24/2015   Cerebrovascular accident (CVA) due to thrombosis of left middle cerebral artery ([place]) 44/62/8638   Embolic stroke ([place]) 17/71/1657   Fatigue 04/23/2015   Type 2 diabetes mellitus ([place]) 90/38/3338   Chronic systolic heart failure ([place]) 03/25/2015   Stroke ([place]) 01/31/2015   Nausea & vomiting 01/29/2015   AKI  (acute kidney injury) ([place]) 01/29/2015   CVA (cerebral infarction) 01/29/2015   Stroke (cerebrum) ([place]) 01/29/2015   Dizziness and giddiness 07/05/2014   Essential hypertension 07/05/2014   Carbuncle and furuncle 07/05/2014   BP (high blood pressure) 07/05/2014   Does not feel right 07/05/2014   Pain of perianal area 07/05/2014   Guttate psoriasis 07/05/2014   Gonalgia 07/05/2014   H/O diabetes mellitus 06/05/2014   CAFL (chronic airflow limitation) ([place]) 06/05/2014   H/O visual disturbance 06/05/2014   H/O elevated lipids 06/05/2014   H/O: HTN (hypertension) 06/05/2014   H/O: osteoarthritis 06/05/2014   Initial insomnia 06/05/2014   Anxiety, generalized 06/05/2014   Depression, major, recurrent, moderate ([place]) 06/05/2014   Microalbuminuria 12/23/2013   Long term current use of insulin ([place]) 12/23/2013   Compulsive tobacco user syndrome 12/23/2013   Type 2 diabetes mellitus with other diabetic kidney complication ([place]) 32/91/9166   Type II diabetes mellitus with renal manifestations, uncontrolled 12/23/2013   Contusion of cheek 03/31/2009   Change in blood platelet count 06/08/2007   Blood in feces 06/01/2007   D (diarrhea) 06/01/2007   Disturbance of skin sensation  09/02/2006   Difficulty hearing 07/25/2006   Acute onset aura migraine 06/27/2006   Cephalalgia 05/27/2006   Clinical depression 10/22/2005   Diabetes mellitus type 2, uncontrolled 10/22/2005   HLD (hyperlipidemia) 10/22/2005   Arthritis, degenerative 10/22/2005   Current tobacco use 10/22/2005    Patient Care Plan: RN Care Management Plan of Care     Problem Identified: CHF, COPD, DM, HTN and Fall Risk      Long-Range Goal: Disease Progression Prevented or Minimized   Start Date: 01/30/2021  Expected End Date: 04/30/2021  Priority: High  Note:   Current Barriers:  Chronic Disease Management support and education needs related to CHF, HTN, COPD, DMII, and Fall Risk  RNCM Clinical Goal(s):  Patient  will demonstrate Ongoing adherence to prescribed treatment plan for CHF, HTN, COPD, DMII, and Fall Risk through collaboration with the provider, RN Care Manager and the care team.   Interventions: 1:1 collaboration with primary care provider regarding development and update of comprehensive plan of care as evidenced by provider attestation and co-signature Inter-disciplinary care team collaboration (see longitudinal plan of care) Evaluation of current treatment plan related to  self management and patient's adherence to plan as established by provider   Heart Failure Interventions:   Reviewed plan for CHF management. Reports taking medications as prescribed. Reviewed weight parameters. Notes edema to lower extremities but reports weights have remained stable. Denies tightness for increased swelling to abdominal area. Denies episodes of chest discomfort or shortness of breath. Advised to monitor weights consistently and notify provider for weights outside of established parameters. Reviewed s/sx of fluid overload.  Advised to monitor sodium intake and adhere to recommended diet. Reviewed worsening s/sx that require immediate medical attention.   Diabetes Interventions:  Reviewed medications and plan for diabetes management. Reports taking oral medications utilizing insulin pump as prescribed.  Discussed concerns related to [employer]. Reports the meter recently stopped working despite attempting to troubleshoot and replacing batteries. Reports not having a back-up glucometer. Reports contacted the Endocrinology team as well as the device manufacturer/[employer] and being advised that a new device would be mailed. Pending follow-up from [employer].  Reviewed s/sx of hyperglycemia and hypoglycemia. Thoroughly discussed importance of closely monitoring symptoms due to insulin dependence and tendency for blood glucose fluctuations. Verbalized appropriate interventions for correcting readings outside of  established range. Strongly advised to obtain a back-up glucometer if she does not receive the new device within the week.    COPD Interventions: Reviewed medications and COPD management. Reports Covid related symptoms have resolved. Reports occasional episodes of mild sinus congestion and drainage relieved with nasal drops.  Reviewed importance of daily symptom assessment. Reminded to contact the clinic or the Pulmonology team if experiencing moderate symptoms for greater than 48 hrs without improvement. Discussed importance of maintaining distance from sick contacts to reduce risk of infection. Reports daily nicotine/tobacco use has decreased. She is attempting to quit. Discussed resources available via [place] [facility]. Reviewed worsening symptoms that require immediate medical attention.   Chronic Kidney Disease: Discussed diagnosis and plan for evaluation r/t chronic kidney disease. Reviewed pending appointments. Encouraged to follow up with the Nephrology team/Dr. Lanora Manis as scheduled on 03/18/21.    Patient Goals/Self-Care Activities: Take all medications as prescribed Attend all scheduled provider appointments Call pharmacy for medication refills 3-7 days in advance of running out of medications Call provider office for new concerns or questions         PLAN A member of the care management team will follow up  within the next week.   Cristy Friedlander Health/[facility] Care Management [facility] [phone]

## 2021-03-18 DIAGNOSIS — N2581 Secondary hyperparathyroidism of renal origin: Secondary | ICD-10-CM | POA: Insufficient documentation

## 2021-03-18 DIAGNOSIS — N184 Chronic kidney disease, stage 4 (severe): Secondary | ICD-10-CM | POA: Diagnosis not present

## 2021-03-18 DIAGNOSIS — D631 Anemia in chronic kidney disease: Secondary | ICD-10-CM | POA: Insufficient documentation

## 2021-03-18 DIAGNOSIS — F32A Depression, unspecified: Secondary | ICD-10-CM | POA: Diagnosis not present

## 2021-03-18 DIAGNOSIS — I509 Heart failure, unspecified: Secondary | ICD-10-CM | POA: Diagnosis not present

## 2021-03-18 DIAGNOSIS — E1122 Type 2 diabetes mellitus with diabetic chronic kidney disease: Secondary | ICD-10-CM | POA: Diagnosis not present

## 2021-03-18 DIAGNOSIS — I639 Cerebral infarction, unspecified: Secondary | ICD-10-CM | POA: Diagnosis not present

## 2021-03-18 DIAGNOSIS — U071 COVID-19: Secondary | ICD-10-CM | POA: Diagnosis not present

## 2021-03-18 DIAGNOSIS — R809 Proteinuria, unspecified: Secondary | ICD-10-CM | POA: Diagnosis not present

## 2021-03-18 DIAGNOSIS — E663 Overweight: Secondary | ICD-10-CM | POA: Diagnosis not present

## 2021-03-18 DIAGNOSIS — I10 Essential (primary) hypertension: Secondary | ICD-10-CM | POA: Diagnosis not present

## 2021-03-18 DIAGNOSIS — I129 Hypertensive chronic kidney disease with stage 1 through stage 4 chronic kidney disease, or unspecified chronic kidney disease: Secondary | ICD-10-CM | POA: Diagnosis not present

## 2021-03-18 DIAGNOSIS — I5030 Unspecified diastolic (congestive) heart failure: Secondary | ICD-10-CM | POA: Insufficient documentation

## 2021-03-20 ENCOUNTER — Ambulatory Visit: Payer: Medicare Other

## 2021-03-20 DIAGNOSIS — E1129 Type 2 diabetes mellitus with other diabetic kidney complication: Secondary | ICD-10-CM

## 2021-03-20 DIAGNOSIS — J449 Chronic obstructive pulmonary disease, unspecified: Secondary | ICD-10-CM

## 2021-03-20 DIAGNOSIS — N184 Chronic kidney disease, stage 4 (severe): Secondary | ICD-10-CM

## 2021-03-20 NOTE — Patient Instructions (Signed)
Thank you for allowing the Chronic Care Management team to participate in your care.  

## 2021-03-20 NOTE — Chronic Care Management (AMB) (Signed)
Chronic Care Management   CCM RN Visit Note  03/20/2021 Name: Jocelyn Wells MRN: [id number] DOB: 1955-03-16  Subjective: Jocelyn Wells is a 66 y.o. year old female who is a primary care patient of Bacigalupo, Dionne Bucy, MD. The care management team was consulted for assistance with disease management and care coordination needs.    Engaged with patient by telephone for follow up visit in response to provider referral for case management and care coordination services.   Consent to Services:  The patient was given information about Chronic Care Management services, agreed to services, and gave verbal consent prior to initiation of services.  Please see initial visit note for detailed documentation.   Assessment: Review of patient past medical history, allergies, medications, health status, including review of consultants reports, laboratory and other test data, was performed as part of comprehensive evaluation and provision of chronic care management services.   SDOH (Social Determinants of Health) assessments and interventions performed:  No  CCM Care Plan  Allergies  Allergen Reactions   Codeine Nausea And Vomiting    Outpatient Encounter Medications as of 03/20/2021  Medication Sig Note   albuterol (VENTOLIN HFA) 108 (90 Base) MCG/ACT inhaler Inhale 2 puffs into the lungs every 6 (six) hours as needed for wheezing or shortness of breath.    apixaban (ELIQUIS) 5 MG TABS tablet Take 1 tablet (5 mg total) by mouth 2 (two) times daily.    aspirin EC 81 MG tablet Take 81 mg by mouth daily. Swallow whole.    atorvastatin (LIPITOR) 40 MG tablet TAKE 1 TABLET BY MOUTH EVERY DAY    B-D INS SYRINGE 0.5CC/31GX5/16 31G X 5/16" 0.5 ML MISC     denosumab (PROLIA) 60 MG/ML SOSY injection Inject 60 mg into the skin every 6 (six) months.    Dulaglutide (TRULICITY) 1.5 SU/1.1SR SOPN Inject 1.5 mg into the skin every 7 (seven) days. (Monday Nights)    fluticasone (FLONASE) 50 MCG/ACT nasal spray  Place 2 sprays into both nostrils daily.    gabapentin (NEURONTIN) 300 MG capsule Take 1 capsule (300 mg total) by mouth daily. (Patient taking differently: Take 300 mg by mouth 2 (two) times daily.) 08/15/2020: Reports taking one capsule every night and being informed to take an additional capsule in the morning if needed.   glucose blood test strip OneTouch Verio strips    glyBURIDE-metformin (GLUCOVANCE) 5-500 MG per tablet Take 2 tablets by mouth 2 (two) times daily.     guaiFENesin-dextromethorphan (ROBITUSSIN DM) 100-10 MG/5ML syrup Take 10 mLs by mouth every 4 (four) hours as needed for cough.    hydrochlorothiazide (HYDRODIURIL) 25 MG tablet Take 25 mg by mouth daily.    insulin aspart (NOVOLOG) 100 UNIT/ML injection Inject up to 100 units daily in insulin pump    Insulin Disposable Pump (OMNIPOD DASH PODS, GEN 4,) MISC USE 1 POD EACH EVERY 72    HOURS    Insulin Disposable Pump (OMNIPOD DASH PODS, GEN 4,) MISC Inject into the skin.    Insulin Human (INSULIN PUMP) SOLN Per endocrinoloy    loratadine (CLARITIN) 10 MG tablet Take 1 tablet by mouth daily.    losartan (COZAAR) 25 MG tablet Take 2 tablets (50 mg total) by mouth daily.    mupirocin ointment (BACTROBAN) 2 % mupirocin 2 % topical ointment    ondansetron (ZOFRAN) 4 MG tablet Take 1 tablet (4 mg total) by mouth every 8 (eight) hours as needed for nausea or vomiting.    PARoxetine (PAXIL)  40 MG tablet Take 1 tablet (40 mg total) by mouth daily.    Spacer/Aero-Holding Chambers [facility] DIAMOND) MISC Use with inhaler to ensure medication is delivered throughout lungs    traMADol (ULTRAM) 50 MG tablet Take 50 mg by mouth 2 (two) times daily as needed.    Vitamin D, Ergocalciferol, (DRISDOL) 1.25 MG (50000 UT) CAPS capsule Take 50,000 Units by mouth once a week. (Sundays)    No facility-administered encounter medications on file as of 03/20/2021.    Patient Active Problem List   Diagnosis Date Noted   Acute cough 02/13/2021    COVID-19 02/13/2021   Acute viral sinusitis 02/13/2021   Poor appetite 02/13/2021   Nausea 02/13/2021   Wheezing 02/13/2021   Congestion of upper respiratory tract 02/13/2021   MDD (major depressive disorder), recurrent episode, mild ([place]) 02/13/2021   Benign hypertensive kidney disease with chronic kidney disease 10/14/2020   Type 2 diabetes mellitus with hyperosmolar hyperglycemic state (HHS) ([place]) 05/31/2020   CKD (chronic kidney disease), stage IV ([place]) 05/31/2020   History of CVA (cerebrovascular accident) 05/31/2020   Hyperglycemia 05/31/2020   MDD (major depressive disorder), recurrent, in full remission ([place]) 04/09/2019   Compression fracture of L1 lumbar vertebra ([place]) 01/30/2019   Compression fracture of body of thoracic vertebra ([place]) 01/30/2019   Lumbar foraminal stenosis (RIGHT L1) 01/30/2019   Spinal stenosis of lumbar region with neurogenic claudication (L4-L5) 01/30/2019   Osteoporosis, post-menopausal 04/14/2018   Vitamin D deficiency 04/14/2018   Chronic deep vein thrombosis (DVT) ([place]) 02/09/2018   Post-menopausal 11/17/2017   Aortic atherosclerosis ([place]) 11/08/2017   CVA (cerebral vascular accident) ([place]) 10/05/2017   Depression 06/12/2015   Cerebrovascular accident (CVA) due to bilateral thrombosis of carotid arteries ([place]) 06/12/2015   Excessive daytime sleepiness 06/12/2015   Nicotine dependence 04/24/2015   Snoring 04/24/2015   Sleep paralysis, recurrent isolated 04/24/2015   Hypersomnia with sleep apnea 04/24/2015   Cataplexy 04/24/2015   Morbid obesity due to excess calories ([place]) 04/24/2015   COPD (chronic obstructive pulmonary disease) ([place]) 04/24/2015   Cerebrovascular accident (CVA) due to thrombosis of left middle cerebral artery ([place]) 09/32/6712   Embolic stroke ([place]) 45/80/9983   Fatigue 04/23/2015   Type 2 diabetes mellitus ([place]) 38/25/0539   Chronic systolic heart failure ([place]) 03/25/2015   Stroke ([place]) 01/31/2015   Nausea & vomiting  01/29/2015   AKI (acute kidney injury) ([place]) 01/29/2015   CVA (cerebral infarction) 01/29/2015   Stroke (cerebrum) ([place]) 01/29/2015   Dizziness and giddiness 07/05/2014   Essential hypertension 07/05/2014   Carbuncle and furuncle 07/05/2014   BP (high blood pressure) 07/05/2014   Does not feel right 07/05/2014   Pain of perianal area 07/05/2014   Guttate psoriasis 07/05/2014   Gonalgia 07/05/2014   H/O diabetes mellitus 06/05/2014   CAFL (chronic airflow limitation) ([place]) 06/05/2014   H/O visual disturbance 06/05/2014   H/O elevated lipids 06/05/2014   H/O: HTN (hypertension) 06/05/2014   H/O: osteoarthritis 06/05/2014   Initial insomnia 06/05/2014   Anxiety, generalized 06/05/2014   Depression, major, recurrent, moderate ([place]) 06/05/2014   Microalbuminuria 12/23/2013   Long term current use of insulin ([place]) 12/23/2013   Compulsive tobacco user syndrome 12/23/2013   Type 2 diabetes mellitus with other diabetic kidney complication ([place]) 76/73/4193   Type II diabetes mellitus with renal manifestations, uncontrolled 12/23/2013   Contusion of cheek 03/31/2009   Change in blood platelet count 06/08/2007   Blood in feces 06/01/2007   D (diarrhea) 06/01/2007   Disturbance of skin  sensation 09/02/2006   Difficulty hearing 07/25/2006   Acute onset aura migraine 06/27/2006   Cephalalgia 05/27/2006   Clinical depression 10/22/2005   Diabetes mellitus type 2, uncontrolled 10/22/2005   HLD (hyperlipidemia) 10/22/2005   Arthritis, degenerative 10/22/2005   Current tobacco use 10/22/2005    Patient Care Plan: RN Care Management Plan of Care     Problem Identified: CHF, COPD, DM, HTN and Fall Risk      Long-Range Goal: Disease Progression Prevented or Minimized   Start Date: 01/30/2021  Expected End Date: 04/30/2021  Priority: High  Note:   Current Barriers:  Chronic Disease Management support and education needs related to CHF, HTN, COPD, DMII, and Fall Risk  RNCM Clinical  Goal(s):  Patient will demonstrate Ongoing adherence to prescribed treatment plan for CHF, HTN, COPD, DMII, and Fall Risk through collaboration with the provider, RN Care Manager and the care team.   Interventions: 1:1 collaboration with primary care provider regarding development and update of comprehensive plan of care as evidenced by provider attestation and co-signature Inter-disciplinary care team collaboration (see longitudinal plan of care) Evaluation of current treatment plan related to  self management and patient's adherence to plan as established by provider   Heart Failure Interventions:  (No Interventions during this Encounter) Reviewed plan for CHF management. Reports taking medications as prescribed. Reviewed weight parameters. Notes edema to lower extremities but reports weights have remained stable. Denies tightness for increased swelling to abdominal area. Denies episodes of chest discomfort or shortness of breath. Advised to monitor weights consistently and notify provider for weights outside of established parameters. Reviewed s/sx of fluid overload.  Advised to monitor sodium intake and adhere to recommended diet. Reviewed worsening s/sx that require immediate medical attention.   Diabetes Interventions:  Reviewed plan for diabetes management. Previously her [place] was not functioning properly. The manufacturer was contacted. Reports the device has been working properly since troubleshooting.  Reviewed blood glucose readings. Report most readings have been within range but notes a few low readings. Attributes it to dietary habits. Verbalized appropriate interventions to correct low readings. Thoroughly discussed importance of consuming balanced meals during the day. Advised to avoid skipping meals and follow recommendations for meal timeframes as advised by the Endocrinology and Nephrology teams. Strongly advised to closely monitor blood levels in case the continuous  monitoring device malfunctions and does not alarm. She will follow up with [employer] if a new device is required.   COPD Interventions: Reviewed plan for COPD management. Reports symptoms have been well controlled with prescribed inhalers. She continues to experience sinus congestion and using nasal drops as advised. Denies cough or episodes of shortness of breath. Discussed plan for smoking cessation. Previously expressed interest in [place] [facility]. Today requesting to explore options/clinics that provide hypnotherapy. She will consider nicotine patches if the service is not available locally  Reviewed importance of daily symptom assessment. Reminded to contact the clinic or the Pulmonology team if experiencing moderate symptoms for greater than 48 hrs without improvement. Discussed importance of maintaining distance from sick contacts to reduce risk of infection. Reviewed worsening symptoms that require immediate medical attention.   Patient Goals/Self-Care Activities: Take all medications as prescribed Attend all scheduled provider appointments Call pharmacy for medication refills 3-7 days in advance of running out of medications Call provider office for new concerns or questions    PLAN Will follow up next week.        PLAN A member of the care management team will follow up  next week.   Cristy Friedlander Health/[facility] Care Management [facility] [phone]

## 2021-03-23 ENCOUNTER — Other Ambulatory Visit: Payer: Self-pay | Admitting: Nephrology

## 2021-03-23 ENCOUNTER — Other Ambulatory Visit (HOSPITAL_COMMUNITY): Payer: Self-pay | Admitting: Nephrology

## 2021-03-23 DIAGNOSIS — E1122 Type 2 diabetes mellitus with diabetic chronic kidney disease: Secondary | ICD-10-CM

## 2021-03-23 DIAGNOSIS — N2581 Secondary hyperparathyroidism of renal origin: Secondary | ICD-10-CM

## 2021-03-23 DIAGNOSIS — N184 Chronic kidney disease, stage 4 (severe): Secondary | ICD-10-CM

## 2021-03-23 DIAGNOSIS — I129 Hypertensive chronic kidney disease with stage 1 through stage 4 chronic kidney disease, or unspecified chronic kidney disease: Secondary | ICD-10-CM

## 2021-03-23 DIAGNOSIS — I509 Heart failure, unspecified: Secondary | ICD-10-CM

## 2021-03-23 DIAGNOSIS — I10 Essential (primary) hypertension: Secondary | ICD-10-CM

## 2021-03-23 DIAGNOSIS — D631 Anemia in chronic kidney disease: Secondary | ICD-10-CM

## 2021-03-23 DIAGNOSIS — R809 Proteinuria, unspecified: Secondary | ICD-10-CM

## 2021-03-25 DIAGNOSIS — H2513 Age-related nuclear cataract, bilateral: Secondary | ICD-10-CM | POA: Diagnosis not present

## 2021-03-25 DIAGNOSIS — H43811 Vitreous degeneration, right eye: Secondary | ICD-10-CM | POA: Diagnosis not present

## 2021-03-25 DIAGNOSIS — E113513 Type 2 diabetes mellitus with proliferative diabetic retinopathy with macular edema, bilateral: Secondary | ICD-10-CM | POA: Diagnosis not present

## 2021-03-25 DIAGNOSIS — H4311 Vitreous hemorrhage, right eye: Secondary | ICD-10-CM | POA: Diagnosis not present

## 2021-03-26 ENCOUNTER — Ambulatory Visit: Payer: Self-pay

## 2021-03-26 DIAGNOSIS — N184 Chronic kidney disease, stage 4 (severe): Secondary | ICD-10-CM

## 2021-03-26 NOTE — Chronic Care Management (AMB) (Signed)
Chronic Care Management   CCM RN Visit Note  03/26/2021 Name: Jocelyn Wells MRN: [id number] DOB: 1955/05/02  Subjective: Jocelyn Wells is a 66 y.o. year old female who is a primary care patient of Bacigalupo, Dionne Bucy, MD. The care management team was consulted for assistance with disease management and care coordination needs.    Engaged with patient by telephone for follow up visit in response to provider referral for case management and care coordination services.   Consent to Services:  The patient was given information about Chronic Care Management services, agreed to services, and gave verbal consent prior to initiation of services.  Please see initial visit note for detailed documentation.   Assessment: Review of patient past medical history, allergies, medications, health status, including review of consultants reports, laboratory and other test data, was performed as part of comprehensive evaluation and provision of chronic care management services.   SDOH (Social Determinants of Health) assessments and interventions performed: No  CCM Care Plan  Allergies  Allergen Reactions   Codeine Nausea And Vomiting    Outpatient Encounter Medications as of 03/26/2021  Medication Sig Note   albuterol (VENTOLIN HFA) 108 (90 Base) MCG/ACT inhaler Inhale 2 puffs into the lungs every 6 (six) hours as needed for wheezing or shortness of breath.    apixaban (ELIQUIS) 5 MG TABS tablet Take 1 tablet (5 mg total) by mouth 2 (two) times daily.    aspirin EC 81 MG tablet Take 81 mg by mouth daily. Swallow whole.    atorvastatin (LIPITOR) 40 MG tablet TAKE 1 TABLET BY MOUTH EVERY DAY    B-D INS SYRINGE 0.5CC/31GX5/16 31G X 5/16" 0.5 ML MISC     denosumab (PROLIA) 60 MG/ML SOSY injection Inject 60 mg into the skin every 6 (six) months.    Dulaglutide (TRULICITY) 1.5 AY/3.0ZS SOPN Inject 1.5 mg into the skin every 7 (seven) days. (Monday Nights)    fluticasone (FLONASE) 50 MCG/ACT nasal spray  Place 2 sprays into both nostrils daily.    gabapentin (NEURONTIN) 300 MG capsule Take 1 capsule (300 mg total) by mouth daily. (Patient taking differently: Take 300 mg by mouth 2 (two) times daily.) 08/15/2020: Reports taking one capsule every night and being informed to take an additional capsule in the morning if needed.   glucose blood test strip OneTouch Verio strips    glyBURIDE-metformin (GLUCOVANCE) 5-500 MG per tablet Take 2 tablets by mouth 2 (two) times daily.     guaiFENesin-dextromethorphan (ROBITUSSIN DM) 100-10 MG/5ML syrup Take 10 mLs by mouth every 4 (four) hours as needed for cough.    hydrochlorothiazide (HYDRODIURIL) 25 MG tablet Take 25 mg by mouth daily.    insulin aspart (NOVOLOG) 100 UNIT/ML injection Inject up to 100 units daily in insulin pump    Insulin Disposable Pump (OMNIPOD DASH PODS, GEN 4,) MISC USE 1 POD EACH EVERY 72    HOURS    Insulin Disposable Pump (OMNIPOD DASH PODS, GEN 4,) MISC Inject into the skin.    Insulin Human (INSULIN PUMP) SOLN Per endocrinoloy    loratadine (CLARITIN) 10 MG tablet Take 1 tablet by mouth daily.    losartan (COZAAR) 25 MG tablet Take 2 tablets (50 mg total) by mouth daily.    mupirocin ointment (BACTROBAN) 2 % mupirocin 2 % topical ointment    ondansetron (ZOFRAN) 4 MG tablet Take 1 tablet (4 mg total) by mouth every 8 (eight) hours as needed for nausea or vomiting.    PARoxetine (PAXIL) 40  MG tablet Take 1 tablet (40 mg total) by mouth daily.    Spacer/Aero-Holding Chambers [facility] DIAMOND) MISC Use with inhaler to ensure medication is delivered throughout lungs    traMADol (ULTRAM) 50 MG tablet Take 50 mg by mouth 2 (two) times daily as needed.    Vitamin D, Ergocalciferol, (DRISDOL) 1.25 MG (50000 UT) CAPS capsule Take 50,000 Units by mouth once a week. (Sundays)    No facility-administered encounter medications on file as of 03/26/2021.    Patient Active Problem List   Diagnosis Date Noted   Acute cough 02/13/2021    COVID-19 02/13/2021   Acute viral sinusitis 02/13/2021   Poor appetite 02/13/2021   Nausea 02/13/2021   Wheezing 02/13/2021   Congestion of upper respiratory tract 02/13/2021   MDD (major depressive disorder), recurrent episode, mild ([place]) 02/13/2021   Benign hypertensive kidney disease with chronic kidney disease 10/14/2020   Type 2 diabetes mellitus with hyperosmolar hyperglycemic state (HHS) ([place]) 05/31/2020   CKD (chronic kidney disease), stage IV ([place]) 05/31/2020   History of CVA (cerebrovascular accident) 05/31/2020   Hyperglycemia 05/31/2020   MDD (major depressive disorder), recurrent, in full remission ([place]) 04/09/2019   Compression fracture of L1 lumbar vertebra ([place]) 01/30/2019   Compression fracture of body of thoracic vertebra ([place]) 01/30/2019   Lumbar foraminal stenosis (RIGHT L1) 01/30/2019   Spinal stenosis of lumbar region with neurogenic claudication (L4-L5) 01/30/2019   Osteoporosis, post-menopausal 04/14/2018   Vitamin D deficiency 04/14/2018   Chronic deep vein thrombosis (DVT) ([place]) 02/09/2018   Post-menopausal 11/17/2017   Aortic atherosclerosis ([place]) 11/08/2017   CVA (cerebral vascular accident) ([place]) 10/05/2017   Depression 06/12/2015   Cerebrovascular accident (CVA) due to bilateral thrombosis of carotid arteries ([place]) 06/12/2015   Excessive daytime sleepiness 06/12/2015   Nicotine dependence 04/24/2015   Snoring 04/24/2015   Sleep paralysis, recurrent isolated 04/24/2015   Hypersomnia with sleep apnea 04/24/2015   Cataplexy 04/24/2015   Morbid obesity due to excess calories ([place]) 04/24/2015   COPD (chronic obstructive pulmonary disease) ([place]) 04/24/2015   Cerebrovascular accident (CVA) due to thrombosis of left middle cerebral artery ([place]) 14/48/1856   Embolic stroke ([place]) 31/49/7026   Fatigue 04/23/2015   Type 2 diabetes mellitus ([place]) 37/85/8850   Chronic systolic heart failure ([place]) 03/25/2015   Stroke ([place]) 01/31/2015   Nausea & vomiting  01/29/2015   AKI (acute kidney injury) ([place]) 01/29/2015   CVA (cerebral infarction) 01/29/2015   Stroke (cerebrum) ([place]) 01/29/2015   Dizziness and giddiness 07/05/2014   Essential hypertension 07/05/2014   Carbuncle and furuncle 07/05/2014   BP (high blood pressure) 07/05/2014   Does not feel right 07/05/2014   Pain of perianal area 07/05/2014   Guttate psoriasis 07/05/2014   Gonalgia 07/05/2014   H/O diabetes mellitus 06/05/2014   CAFL (chronic airflow limitation) ([place]) 06/05/2014   H/O visual disturbance 06/05/2014   H/O elevated lipids 06/05/2014   H/O: HTN (hypertension) 06/05/2014   H/O: osteoarthritis 06/05/2014   Initial insomnia 06/05/2014   Anxiety, generalized 06/05/2014   Depression, major, recurrent, moderate ([place]) 06/05/2014   Microalbuminuria 12/23/2013   Long term current use of insulin ([place]) 12/23/2013   Compulsive tobacco user syndrome 12/23/2013   Type 2 diabetes mellitus with other diabetic kidney complication ([place]) 27/74/1287   Type II diabetes mellitus with renal manifestations, uncontrolled 12/23/2013   Contusion of cheek 03/31/2009   Change in blood platelet count 06/08/2007   Blood in feces 06/01/2007   D (diarrhea) 06/01/2007   Disturbance of skin sensation  09/02/2006   Difficulty hearing 07/25/2006   Acute onset aura migraine 06/27/2006   Cephalalgia 05/27/2006   Clinical depression 10/22/2005   Diabetes mellitus type 2, uncontrolled 10/22/2005   HLD (hyperlipidemia) 10/22/2005   Arthritis, degenerative 10/22/2005   Current tobacco use 10/22/2005     Patient Care Plan: RN Care Management Plan of Care     Problem Identified: CHF, COPD, DM, HTN and Fall Risk      Long-Range Goal: Disease Progression Prevented or Minimized   Start Date: 01/30/2021  Expected End Date: 04/30/2021  Priority: High  Note:   Current Barriers:  Chronic Disease Management support and education needs related to CHF, HTN, COPD, DMII, and Fall Risk  RNCM Clinical  Goal(s):  Patient will demonstrate Ongoing adherence to prescribed treatment plan for CHF, HTN, COPD, DMII, and Fall Risk through collaboration with the provider, RN Care Manager and the care team.   Interventions: 1:1 collaboration with primary care provider regarding development and update of comprehensive plan of care as evidenced by provider attestation and co-signature Inter-disciplinary care team collaboration (see longitudinal plan of care) Evaluation of current treatment plan related to  self management and patient's adherence to plan as established by provider   Heart Failure Interventions:  (No Interventions during this Encounter) Reviewed plan for CHF management. Reports taking medications as prescribed. Reviewed weight parameters. Notes edema to lower extremities but reports weights have remained stable. Denies tightness for increased swelling to abdominal area. Denies episodes of chest discomfort or shortness of breath. Advised to monitor weights consistently and notify provider for weights outside of established parameters. Reviewed s/sx of fluid overload.  Advised to monitor sodium intake and adhere to recommended diet. Reviewed worsening s/sx that require immediate medical attention.   Diabetes Interventions:  Reviewed plan for diabetes management. Previously her [place] was not functioning properly. The manufacturer was contacted. Reports the device has been working properly since troubleshooting.  Reviewed blood glucose readings. Report most readings have been within range but notes a few low readings. Attributes it to dietary habits. Verbalized appropriate interventions to correct low readings. Thoroughly discussed importance of consuming balanced meals during the day. Advised to avoid skipping meals and follow recommendations for meal timeframes as advised by the Endocrinology and Nephrology teams. Strongly advised to closely monitor blood levels in case the continuous  monitoring device malfunctions and does not alarm. She will follow up with [employer] if a new device is required.   COPD Interventions: Reviewed plan for COPD management. Reports symptoms have been well controlled with prescribed inhalers. She continues to experience sinus congestion and using nasal drops as advised. Denies cough or episodes of shortness of breath. Discussed plan for smoking cessation. Previously expressed interest in [place] [facility]. Today requesting to explore options/clinics that provide hypnotherapy. She will consider nicotine patches if the service is not available locally  Reviewed importance of daily symptom assessment. Reminded to contact the clinic or the Pulmonology team if experiencing moderate symptoms for greater than 48 hrs without improvement. Discussed importance of maintaining distance from sick contacts to reduce risk of infection. Reviewed worsening symptoms that require immediate medical attention.   Chronic Kidney Disease: Patient requesting to discuss treatment plan r/t recent diagnosis of Chronic Kidney Disease Stage IV.  Reports completing clinic visits and labs with Dr. Korrapati/Nephrologist as scheduled.  Reports being advised to consider 12 or 16 hr intermittent fasting. Discussed options for meal schedule. Thorough discussion regarding nutritional intake and meals that will comply with recommended cardiac/renal/diabetic diet. Declines  current need for a dietician but will update the care management team if a referral is needed. Patient aware of need to adjust medications as instructed by the Nephrology team. Reviewed pending appointments. Requested assistance with rescheduling Clinic Wellness Visit. Appointment scheduled for 04/16/2021.    Patient Goals/Self-Care Activities: Take all medications as prescribed Attend all scheduled provider appointments Call pharmacy for medication refills 3-7 days in advance of running out of medications Call provider office for  new concerns or questions    PLAN Will follow up next month      PLAN A member of the care management team will follow up next month.   Cristy [facility] Care Management [facility] [phone]

## 2021-04-02 ENCOUNTER — Ambulatory Visit: Payer: Medicare Other

## 2021-04-07 ENCOUNTER — Ambulatory Visit
Admission: RE | Admit: 2021-04-07 | Discharge: 2021-04-07 | Disposition: A | Discharge status: Home / Self Care | Payer: Medicare Other | Source: Ambulatory Visit | Attending: Nephrology | Admitting: Nephrology

## 2021-04-07 ENCOUNTER — Other Ambulatory Visit: Payer: Self-pay

## 2021-04-07 DIAGNOSIS — I509 Heart failure, unspecified: Secondary | ICD-10-CM | POA: Diagnosis not present

## 2021-04-07 DIAGNOSIS — I5022 Chronic systolic (congestive) heart failure: Secondary | ICD-10-CM

## 2021-04-07 DIAGNOSIS — N2581 Secondary hyperparathyroidism of renal origin: Secondary | ICD-10-CM | POA: Insufficient documentation

## 2021-04-07 DIAGNOSIS — D631 Anemia in chronic kidney disease: Secondary | ICD-10-CM | POA: Diagnosis not present

## 2021-04-07 DIAGNOSIS — N184 Chronic kidney disease, stage 4 (severe): Secondary | ICD-10-CM

## 2021-04-07 DIAGNOSIS — E1122 Type 2 diabetes mellitus with diabetic chronic kidney disease: Secondary | ICD-10-CM | POA: Insufficient documentation

## 2021-04-07 DIAGNOSIS — J449 Chronic obstructive pulmonary disease, unspecified: Secondary | ICD-10-CM | POA: Diagnosis not present

## 2021-04-07 DIAGNOSIS — R809 Proteinuria, unspecified: Secondary | ICD-10-CM | POA: Diagnosis not present

## 2021-04-07 DIAGNOSIS — E1129 Type 2 diabetes mellitus with other diabetic kidney complication: Secondary | ICD-10-CM

## 2021-04-07 DIAGNOSIS — I10 Essential (primary) hypertension: Secondary | ICD-10-CM | POA: Diagnosis not present

## 2021-04-07 DIAGNOSIS — I129 Hypertensive chronic kidney disease with stage 1 through stage 4 chronic kidney disease, or unspecified chronic kidney disease: Secondary | ICD-10-CM | POA: Insufficient documentation

## 2021-04-09 ENCOUNTER — Ambulatory Visit (INDEPENDENT_AMBULATORY_CARE_PROVIDER_SITE_OTHER): Payer: Medicare Other

## 2021-04-09 DIAGNOSIS — J449 Chronic obstructive pulmonary disease, unspecified: Secondary | ICD-10-CM

## 2021-04-09 NOTE — Chronic Care Management (AMB) (Unsigned)
Chronic Care Management   CCM RN Visit Note  04/09/2021 Name: Jocelyn Wells MRN: [id number] DOB: 1955/03/16  Subjective: Jocelyn Wells is a 66 y.o. year old female who is a primary care patient of Bacigalupo, Dionne Bucy, MD. The care management team was consulted for assistance with disease management and care coordination needs.    {CCMTELEPHONEFACETOFACE:21091510} for {CCMINITIALFOLLOWUPCHOICE:21091511} in response to provider referral for case management and/or care coordination services.   Consent to Services:  {CCMCONSENTOPTIONS:25074}  Patient agreed to services and verbal consent obtained.   Assessment: Review of patient past medical history, allergies, medications, health status, including review of consultants reports, laboratory and other test data, was performed as part of comprehensive evaluation and provision of chronic care management services.   SDOH (Social Determinants of Health) assessments and interventions performed:    CCM Care Plan  Allergies  Allergen Reactions   Codeine Nausea And Vomiting    Outpatient Encounter Medications as of 04/09/2021  Medication Sig Note   albuterol (VENTOLIN HFA) 108 (90 Base) MCG/ACT inhaler Inhale 2 puffs into the lungs every 6 (six) hours as needed for wheezing or shortness of breath.    apixaban (ELIQUIS) 5 MG TABS tablet Take 1 tablet (5 mg total) by mouth 2 (two) times daily.    aspirin EC 81 MG tablet Take 81 mg by mouth daily. Swallow whole.    atorvastatin (LIPITOR) 40 MG tablet TAKE 1 TABLET BY MOUTH EVERY DAY    B-D INS SYRINGE 0.5CC/31GX5/16 31G X 5/16" 0.5 ML MISC     denosumab (PROLIA) 60 MG/ML SOSY injection Inject 60 mg into the skin every 6 (six) months.    Dulaglutide (TRULICITY) 1.5 TG/6.2IR SOPN Inject 1.5 mg into the skin every 7 (seven) days. (Monday Nights)    fluticasone (FLONASE) 50 MCG/ACT nasal spray Place 2 sprays into both nostrils daily.    gabapentin (NEURONTIN) 300 MG capsule Take 1 capsule (300 mg  total) by mouth daily. (Patient taking differently: Take 300 mg by mouth 2 (two) times daily.) 08/15/2020: Reports taking one capsule every night and being informed to take an additional capsule in the morning if needed.   glucose blood test strip OneTouch Verio strips    glyBURIDE-metformin (GLUCOVANCE) 5-500 MG per tablet Take 2 tablets by mouth 2 (two) times daily.     guaiFENesin-dextromethorphan (ROBITUSSIN DM) 100-10 MG/5ML syrup Take 10 mLs by mouth every 4 (four) hours as needed for cough.    hydrochlorothiazide (HYDRODIURIL) 25 MG tablet Take 25 mg by mouth daily.    insulin aspart (NOVOLOG) 100 UNIT/ML injection Inject up to 100 units daily in insulin pump    Insulin Disposable Pump (OMNIPOD DASH PODS, GEN 4,) MISC USE 1 POD EACH EVERY 72    HOURS    Insulin Disposable Pump (OMNIPOD DASH PODS, GEN 4,) MISC Inject into the skin.    Insulin Human (INSULIN PUMP) SOLN Per endocrinoloy    loratadine (CLARITIN) 10 MG tablet Take 1 tablet by mouth daily.    losartan (COZAAR) 25 MG tablet Take 2 tablets (50 mg total) by mouth daily.    mupirocin ointment (BACTROBAN) 2 % mupirocin 2 % topical ointment    ondansetron (ZOFRAN) 4 MG tablet Take 1 tablet (4 mg total) by mouth every 8 (eight) hours as needed for nausea or vomiting.    PARoxetine (PAXIL) 40 MG tablet Take 1 tablet (40 mg total) by mouth daily.    Spacer/Aero-Holding Chambers [facility] DIAMOND) MISC Use with inhaler to ensure medication is  delivered throughout lungs    traMADol (ULTRAM) 50 MG tablet Take 50 mg by mouth 2 (two) times daily as needed.    Vitamin D, Ergocalciferol, (DRISDOL) 1.25 MG (50000 UT) CAPS capsule Take 50,000 Units by mouth once a week. (Sundays)    No facility-administered encounter medications on file as of 04/09/2021.    Patient Active Problem List   Diagnosis Date Noted   Acute cough 02/13/2021   COVID-19 02/13/2021   Acute viral sinusitis 02/13/2021   Poor appetite 02/13/2021   Nausea 02/13/2021    Wheezing 02/13/2021   Congestion of upper respiratory tract 02/13/2021   MDD (major depressive disorder), recurrent episode, mild ([place]) 02/13/2021   Benign hypertensive kidney disease with chronic kidney disease 10/14/2020   Type 2 diabetes mellitus with hyperosmolar hyperglycemic state (HHS) ([place]) 05/31/2020   CKD (chronic kidney disease), stage IV ([place]) 05/31/2020   History of CVA (cerebrovascular accident) 05/31/2020   Hyperglycemia 05/31/2020   MDD (major depressive disorder), recurrent, in full remission ([place]) 04/09/2019   Compression fracture of L1 lumbar vertebra ([place]) 01/30/2019   Compression fracture of body of thoracic vertebra ([place]) 01/30/2019   Lumbar foraminal stenosis (RIGHT L1) 01/30/2019   Spinal stenosis of lumbar region with neurogenic claudication (L4-L5) 01/30/2019   Osteoporosis, post-menopausal 04/14/2018   Vitamin D deficiency 04/14/2018   Chronic deep vein thrombosis (DVT) ([place]) 02/09/2018   Post-menopausal 11/17/2017   Aortic atherosclerosis ([place]) 11/08/2017   CVA (cerebral vascular accident) ([place]) 10/05/2017   Depression 06/12/2015   Cerebrovascular accident (CVA) due to bilateral thrombosis of carotid arteries ([place]) 06/12/2015   Excessive daytime sleepiness 06/12/2015   Nicotine dependence 04/24/2015   Snoring 04/24/2015   Sleep paralysis, recurrent isolated 04/24/2015   Hypersomnia with sleep apnea 04/24/2015   Cataplexy 04/24/2015   Morbid obesity due to excess calories ([place]) 04/24/2015   COPD (chronic obstructive pulmonary disease) ([place]) 04/24/2015   Cerebrovascular accident (CVA) due to thrombosis of left middle cerebral artery ([place]) 13/24/4010   Embolic stroke ([place]) 27/25/3664   Fatigue 04/23/2015   Type 2 diabetes mellitus ([place]) 40/34/7425   Chronic systolic heart failure ([place]) 03/25/2015   Stroke ([place]) 01/31/2015   Nausea & vomiting 01/29/2015   AKI (acute kidney injury) ([place]) 01/29/2015   CVA (cerebral infarction) 01/29/2015   Stroke (cerebrum)  ([place]) 01/29/2015   Dizziness and giddiness 07/05/2014   Essential hypertension 07/05/2014   Carbuncle and furuncle 07/05/2014   BP (high blood pressure) 07/05/2014   Does not feel right 07/05/2014   Pain of perianal area 07/05/2014   Guttate psoriasis 07/05/2014   Gonalgia 07/05/2014   H/O diabetes mellitus 06/05/2014   CAFL (chronic airflow limitation) ([place]) 06/05/2014   H/O visual disturbance 06/05/2014   H/O elevated lipids 06/05/2014   H/O: HTN (hypertension) 06/05/2014   H/O: osteoarthritis 06/05/2014   Initial insomnia 06/05/2014   Anxiety, generalized 06/05/2014   Depression, major, recurrent, moderate ([place]) 06/05/2014   Microalbuminuria 12/23/2013   Long term current use of insulin ([place]) 12/23/2013   Compulsive tobacco user syndrome 12/23/2013   Type 2 diabetes mellitus with other diabetic kidney complication ([place]) 95/63/8756   Type II diabetes mellitus with renal manifestations, uncontrolled 12/23/2013   Contusion of cheek 03/31/2009   Change in blood platelet count 06/08/2007   Blood in feces 06/01/2007   D (diarrhea) 06/01/2007   Disturbance of skin sensation 09/02/2006   Difficulty hearing 07/25/2006   Acute onset aura migraine 06/27/2006   Cephalalgia 05/27/2006   Clinical depression 10/22/2005   Diabetes mellitus  type 2, uncontrolled 10/22/2005   HLD (hyperlipidemia) 10/22/2005   Arthritis, degenerative 10/22/2005   Current tobacco use 10/22/2005   PLAN

## 2021-04-10 ENCOUNTER — Ambulatory Visit: Payer: Self-pay

## 2021-04-10 DIAGNOSIS — J449 Chronic obstructive pulmonary disease, unspecified: Secondary | ICD-10-CM

## 2021-04-10 DIAGNOSIS — Z9181 History of falling: Secondary | ICD-10-CM

## 2021-04-10 NOTE — Chronic Care Management (AMB) (Unsigned)
Chronic Care Management   CCM RN Visit Note  04/10/2021 Name: Jocelyn Wells MRN: [id number] DOB: [birth year]  Subjective: Jocelyn Wells is a 66 y.o. year old female who is a primary care patient of Bacigalupo, Dionne Bucy, MD. The care management team was consulted for assistance with disease management and care coordination needs.    {CCMTELEPHONEFACETOFACE:21091510} for {CCMINITIALFOLLOWUPCHOICE:21091511} in response to provider referral for case management and/or care coordination services.   Consent to Services:  {CCMCONSENTOPTIONS:25074}  Patient agreed to services and verbal consent obtained.   Assessment: Review of patient past medical history, allergies, medications, health status, including review of consultants reports, laboratory and other test data, was performed as part of comprehensive evaluation and provision of chronic care management services.   SDOH (Social Determinants of Health) assessments and interventions performed:    CCM Care Plan  Allergies  Allergen Reactions   Codeine Nausea And Vomiting    Outpatient Encounter Medications as of 04/10/2021  Medication Sig Note   albuterol (VENTOLIN HFA) 108 (90 Base) MCG/ACT inhaler Inhale 2 puffs into the lungs every 6 (six) hours as needed for wheezing or shortness of breath.    apixaban (ELIQUIS) 5 MG TABS tablet Take 1 tablet (5 mg total) by mouth 2 (two) times daily.    aspirin EC 81 MG tablet Take 81 mg by mouth daily. Swallow whole.    atorvastatin (LIPITOR) 40 MG tablet TAKE 1 TABLET BY MOUTH EVERY DAY    B-D INS SYRINGE 0.5CC/31GX5/16 31G X 5/16" 0.5 ML MISC     denosumab (PROLIA) 60 MG/ML SOSY injection Inject 60 mg into the skin every 6 (six) months.    Dulaglutide (TRULICITY) 1.5 IW/5.8KD SOPN Inject 1.5 mg into the skin every 7 (seven) days. (Monday Nights)    fluticasone (FLONASE) 50 MCG/ACT nasal spray Place 2 sprays into both nostrils daily.    gabapentin (NEURONTIN) 300 MG capsule Take 1 capsule (300 mg  total) by mouth daily. (Patient taking differently: Take 300 mg by mouth 2 (two) times daily.) 08/15/2020: Reports taking one capsule every night and being informed to take an additional capsule in the morning if needed.   glucose blood test strip OneTouch Verio strips    glyBURIDE-metformin (GLUCOVANCE) 5-500 MG per tablet Take 2 tablets by mouth 2 (two) times daily.     guaiFENesin-dextromethorphan (ROBITUSSIN DM) 100-10 MG/5ML syrup Take 10 mLs by mouth every 4 (four) hours as needed for cough.    hydrochlorothiazide (HYDRODIURIL) 25 MG tablet Take 25 mg by mouth daily.    insulin aspart (NOVOLOG) 100 UNIT/ML injection Inject up to 100 units daily in insulin pump    Insulin Disposable Pump (OMNIPOD DASH PODS, GEN 4,) MISC USE 1 POD EACH EVERY 72    HOURS    Insulin Disposable Pump (OMNIPOD DASH PODS, GEN 4,) MISC Inject into the skin.    Insulin Human (INSULIN PUMP) SOLN Per endocrinoloy    loratadine (CLARITIN) 10 MG tablet Take 1 tablet by mouth daily.    losartan (COZAAR) 25 MG tablet Take 2 tablets (50 mg total) by mouth daily.    mupirocin ointment (BACTROBAN) 2 % mupirocin 2 % topical ointment    ondansetron (ZOFRAN) 4 MG tablet Take 1 tablet (4 mg total) by mouth every 8 (eight) hours as needed for nausea or vomiting.    PARoxetine (PAXIL) 40 MG tablet Take 1 tablet (40 mg total) by mouth daily.    Spacer/Aero-Holding Chambers [facility] DIAMOND) MISC Use with inhaler to ensure medication is  delivered throughout lungs    traMADol (ULTRAM) 50 MG tablet Take 50 mg by mouth 2 (two) times daily as needed.    Vitamin D, Ergocalciferol, (DRISDOL) 1.25 MG (50000 UT) CAPS capsule Take 50,000 Units by mouth once a week. (Sundays)    No facility-administered encounter medications on file as of 04/10/2021.    Patient Active Problem List   Diagnosis Date Noted   Acute cough 02/13/2021   COVID-19 02/13/2021   Acute viral sinusitis 02/13/2021   Poor appetite 02/13/2021   Nausea 02/13/2021    Wheezing 02/13/2021   Congestion of upper respiratory tract 02/13/2021   MDD (major depressive disorder), recurrent episode, mild ([place]) 02/13/2021   Benign hypertensive kidney disease with chronic kidney disease 10/14/2020   Type 2 diabetes mellitus with hyperosmolar hyperglycemic state (HHS) ([place]) 05/31/2020   CKD (chronic kidney disease), stage IV ([place]) 05/31/2020   History of CVA (cerebrovascular accident) 05/31/2020   Hyperglycemia 05/31/2020   MDD (major depressive disorder), recurrent, in full remission ([place]) 04/09/2019   Compression fracture of L1 lumbar vertebra ([place]) 01/30/2019   Compression fracture of body of thoracic vertebra ([place]) 01/30/2019   Lumbar foraminal stenosis (RIGHT L1) 01/30/2019   Spinal stenosis of lumbar region with neurogenic claudication (L4-L5) 01/30/2019   Osteoporosis, post-menopausal 04/14/2018   Vitamin D deficiency 04/14/2018   Chronic deep vein thrombosis (DVT) ([place]) 02/09/2018   Post-menopausal 11/17/2017   Aortic atherosclerosis ([place]) 11/08/2017   CVA (cerebral vascular accident) ([place]) 10/05/2017   Depression 06/12/2015   Cerebrovascular accident (CVA) due to bilateral thrombosis of carotid arteries ([place]) 06/12/2015   Excessive daytime sleepiness 06/12/2015   Nicotine dependence 04/24/2015   Snoring 04/24/2015   Sleep paralysis, recurrent isolated 04/24/2015   Hypersomnia with sleep apnea 04/24/2015   Cataplexy 04/24/2015   Morbid obesity due to excess calories ([place]) 04/24/2015   COPD (chronic obstructive pulmonary disease) ([place]) 04/24/2015   Cerebrovascular accident (CVA) due to thrombosis of left middle cerebral artery ([place]) 03/50/0938   Embolic stroke ([place]) 18/29/9371   Fatigue 04/23/2015   Type 2 diabetes mellitus ([place]) 69/67/8938   Chronic systolic heart failure ([place]) 03/25/2015   Stroke ([place]) 01/31/2015   Nausea & vomiting 01/29/2015   AKI (acute kidney injury) ([place]) 01/29/2015   CVA (cerebral infarction) 01/29/2015   Stroke (cerebrum)  ([place]) 01/29/2015   Dizziness and giddiness 07/05/2014   Essential hypertension 07/05/2014   Carbuncle and furuncle 07/05/2014   BP (high blood pressure) 07/05/2014   Does not feel right 07/05/2014   Pain of perianal area 07/05/2014   Guttate psoriasis 07/05/2014   Gonalgia 07/05/2014   H/O diabetes mellitus 06/05/2014   CAFL (chronic airflow limitation) ([place]) 06/05/2014   H/O visual disturbance 06/05/2014   H/O elevated lipids 06/05/2014   H/O: HTN (hypertension) 06/05/2014   H/O: osteoarthritis 06/05/2014   Initial insomnia 06/05/2014   Anxiety, generalized 06/05/2014   Depression, major, recurrent, moderate ([place]) 06/05/2014   Microalbuminuria 12/23/2013   Long term current use of insulin ([place]) 12/23/2013   Compulsive tobacco user syndrome 12/23/2013   Type 2 diabetes mellitus with other diabetic kidney complication ([place]) 11/24/5100   Type II diabetes mellitus with renal manifestations, uncontrolled 12/23/2013   Contusion of cheek 03/31/2009   Change in blood platelet count 06/08/2007   Blood in feces 06/01/2007   D (diarrhea) 06/01/2007   Disturbance of skin sensation 09/02/2006   Difficulty hearing 07/25/2006   Acute onset aura migraine 06/27/2006   Cephalalgia 05/27/2006   Clinical depression 10/22/2005   Diabetes mellitus  type 2, uncontrolled 10/22/2005   HLD (hyperlipidemia) 10/22/2005   Arthritis, degenerative 10/22/2005   Current tobacco use 10/22/2005    Conditions to be addressed/monitored:{CCM ASSESSMENT DZ OPTIONS:25047}  There are no care plans that you recently modified to display for this patient.   Plan:{CM FOLLOW UP PLAN:25073} SIG***

## 2021-04-10 NOTE — Patient Instructions (Signed)
Thank you for allowing the Chronic Care Management team to participate in your care.  

## 2021-04-10 NOTE — Progress Notes (Signed)
[facility] MD/PA/NP OP Progress Note  04/13/2021 9:01 AM Jocelyn Wells  MRN:  [id number]  Chief Complaint:  Chief Complaint  Patient presents with   Follow-up   HPI:  This is a follow-up appointment for depression and PTSD.  She was last seen in July 2022.  She states that she has been doing the same.  She enjoys take care of her dog.  She stays in the house most of the time except her husband may bring her to places.  She had a COVID early in the year.  She has occasional struggle with fatigue.  She also states that she sometimes have dysarthria, which she attributes to her history of stroke.  She quit smoking for a month.  She has started to do this as her husband decided to do this after his surgery.  Although she smoked a few times since then, she has been doing good otherwise.  She reports occasional fluctuation in her appetite.  She has depressive symptoms as in PHQ-9.  She denies SI.  She feels anxious at times, although she denies panic attacks.  She feels comfortable to stay on the current dose of Paxil.   Daily routine: she does not have any routine. 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 She lost job as she was there "so long." She got disability for depression, followed by stroke.  Support: best friend, husband Household: husband Marital status: married for 24 years, married four times Number of children: 2 and 2 step children. She has 3 grandchildren (age 31, 53, 32) Her parents were separated, and she lived with her mother, brother. Both of her parents passed away    Wt Readings from Last 3 Encounters:  04/13/21 232 lb 6.4 oz (105.4 kg)  12/08/20 230 lb (104.3 kg)  10/20/20 241 lb 11.2 oz (109.6 kg)    Visit Diagnosis:    ICD-10-CM   1. PTSD (post-traumatic stress disorder)  F43.10     2. MDD (major depressive disorder), recurrent episode, mild (HCC)  F33.0 PARoxetine (PAXIL) 40 MG tablet      Past  Psychiatric History: Please see initial evaluation for full details. I have reviewed the history. No updates at this time.     Past Medical History:  Past Medical History:  Diagnosis Date   Anxiety    Cardiomyopathy [facility])    new to her Jan 2017   COPD (chronic obstructive pulmonary disease) ([place])    Depression    Diabetes mellitus, type II ([place])    HTN (hypertension)    Obesity    Osteoporosis    PONV (postoperative nausea and vomiting)    Stroke [facility])    Jan 2017    Past Surgical History:  Procedure Laterality Date   ABDOMINAL HYSTERECTOMY     ANKLE FRACTURE SURGERY Left 2002   BILATERAL SALPINGOOPHORECTOMY  2000   CARDIAC CATHETERIZATION N/A 03/25/2015   Procedure: Right/Left Heart Cath and Coronary Angiography;  Surgeon: Belva Crome, MD;  Location: [facility] CV LAB;  Service: Cardiovascular;  Laterality: N/A;   CESAREAN SECTION  1978   COLONOSCOPY WITH PROPOFOL N/A 09/22/2016   Procedure: COLONOSCOPY WITH PROPOFOL;  Surgeon: Manya Silvas, MD;  Location: [facility] ENDOSCOPY;  Service: Endoscopy;  Laterality: N/A;   EP IMPLANTABLE DEVICE N/A 08/05/2015   Procedure: Loop Recorder Insertion;  Surgeon: Deboraha Sprang, MD;  Location: [facility] CV LAB;  Service: Cardiovascular;  Laterality: N/A;  ESOPHAGOGASTRODUODENOSCOPY (EGD) WITH PROPOFOL N/A 09/22/2016   Procedure: ESOPHAGOGASTRODUODENOSCOPY (EGD) WITH PROPOFOL;  Surgeon: Manya Silvas, MD;  Location[facility] END[facility];  Service: Endoscopy;  Laterality: N/A;   ESOPHAGOGASTRODUODENOSCOPY (EGD) WITH PROPOFOL N/A 12/08/2016   Procedure: ESOPHAGOGASTRODUODENOSCOPY (EGD) WITH PROPOFOL;  Surgeon: Manya Silvas, MD;  Location: [facility] ENDOSCOPY;  Service: Endoscopy;  Laterality: N/A;   KNEE ARTHROSCOPY Left 2005   TEE WITHOUT CARDIOVERSION N/A 01/31/2015   Procedure: TRANSESOPHAGEAL ECHOCARDIOGRAM (TEE);  Surgeon: Lelon Perla, MD;  Location: [facility] ENDOSCOPY;  Service: Cardiovascular;  Laterality: N/A;   [place]  2008    Family Psychiatric History: Please see initial evaluation for full details. I have reviewed the history. No updates at this time.     Family History:  Family History  Problem Relation Age of Onset   Heart disease Mother        died from CHF   Asthma Mother    Diabetes Mother    Heart disease Father    Aneurysm Father    COPD Brother    Diabetes Brother    Alcohol abuse Paternal Aunt    Anemia Neg Hx    Arrhythmia Neg Hx    Clotting disorder Neg Hx    Fainting Neg Hx    Heart attack Neg Hx    Heart failure Neg Hx    Hyperlipidemia Neg Hx    Hypertension Neg Hx     Social History:  Social History   Socioeconomic History   Marital status: Married    Spouse name: Not on file   Number of children: 2   Years of education: Not on file   Highest education level: Bachelor's degree (e.g., BA, AB, BS)  Occupational History   Occupation: disabled    Comment: retired  Tobacco Use   Smoking status: Every Day    Packs/day: 1.00    Years: 36.00    Pack years: 36.00    Types: Cigarettes    Start date: 02/08/1974   Smokeless tobacco: Never   Tobacco comments:    "I can not quit smoking, I have tried". 1 PPD  Vaping Use   Vaping Use: Never used  Substance and Sexual Activity   Alcohol use: No    Alcohol/week: 0.0 standard drinks   Drug use: No   Sexual activity: Yes    Birth control/protection: None  Other Topics Concern   Not on file  Social History Narrative   Lives at home with stepson and husband, dogs and cats   Caffeine  Drinks sweet tea.   Right handed.    Social Determinants of Health   Financial Resource Strain: Not on file  Food Insecurity: Not on file  Transportation Needs: Not on file  Physical Activity: Not on file  Stress: Not on file  Social Connections: Not on file    Allergies:  Allergies  Allergen Reactions   Codeine Nausea And Vomiting    Metabolic Disorder Labs: Lab Results  Component Value Date    HGBA1C 10.2 (H) 05/31/2020   MPG 246 05/31/2020   MPG 303.44 10/06/2017   No results found for: PROLACTIN Lab Results  Component Value Date   CHOL 271 (H) 10/20/2020   TRIG 231 (H) 10/20/2020   HDL 41 10/20/2020   CHOLHDL 6.6 (H) 10/20/2020   VLDL 64 (H) 10/06/2017   LDLCALC 186 (H) 10/20/2020   LDLCALC 70 07/10/2020   No results found for: TSH  Therapeutic Level  Labs: No results found for: LITHIUM No results found for: VALPROATE No components found for:  CBMZ  Current Medications: Current Outpatient Medications  Medication Sig Dispense Refill   albuterol (VENTOLIN HFA) 108 (90 Base) MCG/ACT inhaler Inhale 2 puffs into the lungs every 6 (six) hours as needed for wheezing or shortness of breath. 8 g 2   apixaban (ELIQUIS) 5 MG TABS tablet Take 1 tablet (5 mg total) by mouth 2 (two) times daily. 180 tablet 3   aspirin EC 81 MG tablet Take 81 mg by mouth daily. Swallow whole.     atorvastatin (LIPITOR) 40 MG tablet TAKE 1 TABLET BY MOUTH EVERY DAY 90 tablet 2   B-D INS SYRINGE 0.5CC/31GX5/16 31G X 5/16" 0.5 ML MISC      denosumab (PROLIA) 60 MG/ML SOSY injection Inject 60 mg into the skin every 6 (six) months.     Dulaglutide (TRULICITY) 1.5 GG/8.3MO SOPN Inject 1.5 mg into the skin every 7 (seven) days. (Monday Nights)     fluticasone (FLONASE) 50 MCG/ACT nasal spray Place 2 sprays into both nostrils daily. 16 g 6   glucose blood test strip OneTouch Verio strips     glyBURIDE-metformin (GLUCOVANCE) 5-500 MG per tablet Take 2 tablets by mouth 2 (two) times daily.      guaiFENesin-dextromethorphan (ROBITUSSIN DM) 100-10 MG/5ML syrup Take 10 mLs by mouth every 4 (four) hours as needed for cough. 118 mL 1   hydrochlorothiazide (HYDRODIURIL) 25 MG tablet Take 25 mg by mouth daily.     insulin aspart (NOVOLOG) 100 UNIT/ML injection Inject up to 100 units daily in insulin pump     Insulin Disposable Pump (OMNIPOD DASH PODS, GEN 4,) MISC USE 1 POD EACH EVERY 72    HOURS     Insulin  Disposable Pump (OMNIPOD DASH PODS, GEN 4,) MISC Inject into the skin.     Insulin Human (INSULIN PUMP) SOLN Per endocrinoloy     loratadine (CLARITIN) 10 MG tablet Take 1 tablet by mouth daily.     losartan (COZAAR) 25 MG tablet Take 2 tablets (50 mg total) by mouth daily. 180 tablet 1   mupirocin ointment (BACTROBAN) 2 % mupirocin 2 % topical ointment     ondansetron (ZOFRAN) 4 MG tablet Take 1 tablet (4 mg total) by mouth every 8 (eight) hours as needed for nausea or vomiting. 20 tablet 0   Spacer/Aero-Holding Chambers (OPTICHAMBER DIAMOND) MISC Use with inhaler to ensure medication is delivered throughout lungs 1 each 0   traMADol (ULTRAM) 50 MG tablet Take 50 mg by mouth 2 (two) times daily as needed.     Vitamin D, Ergocalciferol, (DRISDOL) 1.25 MG (50000 UT) CAPS capsule Take 50,000 Units by mouth once a week. (Sundays)     gabapentin (NEURONTIN) 300 MG capsule Take 1 capsule (300 mg total) by mouth daily. (Patient taking differently: Take 300 mg by mouth 2 (two) times daily.) 90 capsule 3   PARoxetine (PAXIL) 40 MG tablet Take 1 tablet (40 mg total) by mouth daily. 90 tablet 0   No current facility-administered medications for this visit.     Musculoskeletal: Strength & Muscle Tone: normal Gait & Station: uses a cane Patient leans: N/A  Psychiatric Specialty Exam: Review of Systems  Psychiatric/Behavioral:  Positive for decreased concentration, dysphoric mood and sleep disturbance. Negative for agitation, behavioral problems, confusion, hallucinations, self-injury and suicidal ideas. The patient is nervous/anxious. The patient is not hyperactive.   All other systems reviewed and are negative.  Blood pressure Marland Kitchen)  173/91, pulse 87, temperature 98.5 F (36.9 C), temperature source Temporal, weight 232 lb 6.4 oz (105.4 kg), SpO2 90 %.Body mass index is 37.51 kg/m.  General Appearance: Fairly Groomed  Eye Contact:  Good  Speech:  Clear and Coherent  Volume:  Normal  Mood:   good   Affect:  Appropriate, Congruent, and euthymic  Thought Process:  Coherent  Orientation:  Full (Time, Place, and Person)  Thought Content: Logical   Suicidal Thoughts:  No  Homicidal Thoughts:  No  Memory:  Immediate;   Good  Judgement:  Good  Insight:  Good  Psychomotor Activity:  Normal  Concentration:  Concentration: Good and Attention Span: Good  Recall:  Good  Fund of Knowledge: Good  Language: Good  Akathisia:  No  Handed:  Right  AIMS (if indicated): not done  Assets:  Communication Skills Desire for Improvement  ADL's:  Intact  Cognition: WNL  Sleep:  Fair   Screenings: PHQ2-9    [facility] Office Visit from 04/13/2021 in [place] Office Visit from 10/02/2020 in [facility] Office Visit from 08/25/2020 in [facility] Office Visit from 07/10/2020 in [facility] Visit from 12/06/2019 in [facility]  PHQ-2 Total Score 2 0 2 2 0  PHQ-9 Total Score 6 -- 6 6 --      [place] Office Visit from 04/13/2021 in [facility] ED to Hosp-Admission (Discharged) from 05/31/2020 in [facility] No Risk No Risk        Assessment and Plan:  LADIAMOND GALLINA is a 66 y.o. year old female with a history of depression, type II DM, hypertension, hyperlipidemia, non ischemic cardiomyopathy,  CKD stage IV, cryptogenic stroke, COPD, sleep apnea, back pain with T12-L1 compression fracture, T7-8 disc herniation with some spinal cord compression with no clinical myelopathy, who presents for follow up appointment for below.     1. PTSD (post-traumatic stress disorder) 2. MDD (major depressive disorder), recurrent episode, mild ([place]) Although she reports occasional depressive symptoms, it has been at her baseline, and she denies concern  about this. Psychosocial stressors includes unemployment, medical history of chronic pain and stroke.  Will continue current dose of Paxil to target depression.  Noted that she has been on this medication for many years without significant side effect.    # Insomnia She reports history of snoring, daytime fatigue and insomnia.  Although she was strongly recommended to do a sleep evaluation, she declined this, referring to the evaluation she had few years ago.  Will continue to monitor.    # Hypertension She has had marked hypertension on today's visit.  She has an appointment with her PCP later today; she was advised to discuss this.   Plan Continue Paxil 40 mg daily Next appointment- 5/30 at 1:20 for 20 mins, video (if not able, try phone) -on tramadol. Will monitor for serotonin syndrome   The patient demonstrates the following risk factors for suicide: Chronic risk factors for suicide include: psychiatric disorder of depression and chronic pain. Acute risk factors for suicide include: unemployment. Protective factors for this patient include: positive social support, coping skills, and hope for the future. Considering these factors, the overall suicide risk at this point appears to be low. Patient is appropriate for outpatient follow up.   Collaboration of Care: Collaboration of Care: Other N/A  Consent: Patient/Guardian  gives verbal consent for treatment and assignment of benefits for services provided during this visit. Patient/Guardian expressed understanding and agreed to proceed.    Norman Clay, MD 04/13/2021, 9:01 AM

## 2021-04-13 ENCOUNTER — Encounter: Payer: Self-pay | Admitting: Psychiatry

## 2021-04-13 ENCOUNTER — Ambulatory Visit (INDEPENDENT_AMBULATORY_CARE_PROVIDER_SITE_OTHER): Payer: Medicare Other | Admitting: Psychiatry

## 2021-04-13 ENCOUNTER — Other Ambulatory Visit: Payer: Self-pay

## 2021-04-13 ENCOUNTER — Encounter: Payer: Self-pay | Admitting: Family Medicine

## 2021-04-13 ENCOUNTER — Ambulatory Visit
Admission: RE | Admit: 2021-04-13 | Discharge: 2021-04-13 | Disposition: A | Discharge status: Home / Self Care | Payer: Medicare Other | Source: Ambulatory Visit | Attending: Family Medicine | Admitting: Family Medicine

## 2021-04-13 ENCOUNTER — Ambulatory Visit
Admission: RE | Admit: 2021-04-13 | Discharge: 2021-04-13 | Disposition: A | Discharge status: Home / Self Care | Payer: Medicare Other | Attending: Family Medicine | Admitting: Family Medicine

## 2021-04-13 ENCOUNTER — Ambulatory Visit (INDEPENDENT_AMBULATORY_CARE_PROVIDER_SITE_OTHER): Payer: Medicare Other | Admitting: Family Medicine

## 2021-04-13 VITALS — BP 128/70 | HR 80 | Temp 97.8°F | Resp 16 | Wt 233.2 lb

## 2021-04-13 VITALS — BP 173/91 | HR 87 | Temp 98.5°F | Wt 232.4 lb

## 2021-04-13 DIAGNOSIS — M47814 Spondylosis without myelopathy or radiculopathy, thoracic region: Secondary | ICD-10-CM | POA: Insufficient documentation

## 2021-04-13 DIAGNOSIS — Z794 Long term (current) use of insulin: Secondary | ICD-10-CM

## 2021-04-13 DIAGNOSIS — E1169 Type 2 diabetes mellitus with other specified complication: Secondary | ICD-10-CM

## 2021-04-13 DIAGNOSIS — W19XXXA Unspecified fall, initial encounter: Secondary | ICD-10-CM

## 2021-04-13 DIAGNOSIS — F4310 Post-traumatic stress disorder, unspecified: Secondary | ICD-10-CM

## 2021-04-13 DIAGNOSIS — M25551 Pain in right hip: Secondary | ICD-10-CM | POA: Diagnosis not present

## 2021-04-13 DIAGNOSIS — S22080A Wedge compression fracture of T11-T12 vertebra, initial encounter for closed fracture: Secondary | ICD-10-CM | POA: Diagnosis not present

## 2021-04-13 DIAGNOSIS — I700 Atherosclerosis of aorta: Secondary | ICD-10-CM | POA: Diagnosis not present

## 2021-04-13 DIAGNOSIS — F330 Major depressive disorder, recurrent, mild: Secondary | ICD-10-CM | POA: Diagnosis not present

## 2021-04-13 DIAGNOSIS — M25552 Pain in left hip: Secondary | ICD-10-CM | POA: Insufficient documentation

## 2021-04-13 DIAGNOSIS — E785 Hyperlipidemia, unspecified: Secondary | ICD-10-CM

## 2021-04-13 DIAGNOSIS — E1129 Type 2 diabetes mellitus with other diabetic kidney complication: Secondary | ICD-10-CM | POA: Diagnosis not present

## 2021-04-13 DIAGNOSIS — R296 Repeated falls: Secondary | ICD-10-CM | POA: Insufficient documentation

## 2021-04-13 DIAGNOSIS — M47816 Spondylosis without myelopathy or radiculopathy, lumbar region: Secondary | ICD-10-CM | POA: Insufficient documentation

## 2021-04-13 DIAGNOSIS — E1159 Type 2 diabetes mellitus with other circulatory complications: Secondary | ICD-10-CM | POA: Diagnosis not present

## 2021-04-13 DIAGNOSIS — N184 Chronic kidney disease, stage 4 (severe): Secondary | ICD-10-CM

## 2021-04-13 DIAGNOSIS — K8020 Calculus of gallbladder without cholecystitis without obstruction: Secondary | ICD-10-CM | POA: Insufficient documentation

## 2021-04-13 DIAGNOSIS — I152 Hypertension secondary to endocrine disorders: Secondary | ICD-10-CM | POA: Diagnosis not present

## 2021-04-13 DIAGNOSIS — S79919A Unspecified injury of unspecified hip, initial encounter: Secondary | ICD-10-CM | POA: Diagnosis present

## 2021-04-13 DIAGNOSIS — M545 Low back pain, unspecified: Secondary | ICD-10-CM | POA: Diagnosis not present

## 2021-04-13 MED ORDER — PAROXETINE HCL 40 MG PO TABS: 40.0000 mg | ORAL_TABLET | Freq: Every day | ORAL | 0 refills | Status: DC

## 2021-04-13 NOTE — Progress Notes (Signed)
I,Sulibeya S Dimas,acting as a Neurosurgeon for Shirlee Latch, MD.,have documented all relevant documentation on the behalf of Shirlee Latch, MD,as directed by  Shirlee Latch, MD while in the presence of Shirlee Latch, MD.   Established patient visit   Patient: Jocelyn Wells   DOB: 08-Jul-1955   66 y.o. Female  MRN: [id number] Visit Date: 04/13/2021  Today's healthcare provider: Shirlee Latch, MD   Chief Complaint  Patient presents with   Hypertension   Hyperlipidemia   Fall   Subjective    HPI  Patient reports she fell at home last month 03/20/21. Lost balance and landed on one side/hip, not entirely sure.  Husband and son helped her out of the floor.  Patient reports she did not seek medical treatment. Patient is c/o lower back (sharp and shooting) pain radiating to legs (electric and burning), reports pain is worsening. Patient has not taken any OTC medications.  Taking Tramadol without much relief. Sees pain management for chronic pain - he Rx's tramadol.  H/o compression fractures of thoracic and lumbar vertebrae. Known lumbar formainal stenosis of R L1. Not a good candidate for surgery.  Has quit smoking.  Hypertension, follow-up  BP Readings from Last 3 Encounters:  04/13/21 128/70  12/08/20 (!) 169/100  10/20/20 115/72   Wt Readings from Last 3 Encounters:  04/13/21 233 lb 3.2 oz (105.8 kg)  12/08/20 230 lb (104.3 kg)  10/20/20 241 lb 11.2 oz (109.6 kg)     She was last seen for hypertension 6 months ago.  BP at that visit was 115/72. Management since that visit includes no changes. She reports excellent compliance with treatment. She is not having side effects.  She is not exercising. She is adherent to low salt diet.   Outside blood pressures are not being checked.  Use of agents associated with hypertension: none.   --------------------------------------------------------------------------------------------------- Lipid/Cholesterol,  follow-up  Last Lipid Panel: Lab Results  Component Value Date   CHOL 271 (H) 10/20/2020   LDLCALC 186 (H) 10/20/2020   HDL 41 10/20/2020   TRIG 231 (H) 10/20/2020    She was last seen for this 6 months ago.  Management since that visit includes no changes.  She reports excellent compliance with treatment. She is not having side effects.   Symptoms: Yes appetite changes No foot ulcerations  No chest pain No chest pressure/discomfort  Yes dyspnea No orthopnea  Yes fatigue Yes lower extremity edema  No palpitations No paroxysmal nocturnal dyspnea  Yes nausea Yes numbness or tingling of extremity  No polydipsia No polyuria  No speech difficulty No syncope   She is following a Regular diet. Current exercise: none  Last metabolic panel Lab Results  Component Value Date   GLUCOSE 205 (H) 10/20/2020   NA 141 10/20/2020   K 4.6 10/20/2020   BUN 37 (H) 10/20/2020   CREATININE 2.62 (H) 10/20/2020   EGFR 20 (L) 10/20/2020   GFRNONAA 31 (L) 06/01/2020   CALCIUM 8.6 (L) 10/20/2020   AST 8 10/20/2020   ALT 9 10/20/2020   The ASCVD Risk score (Arnett DK, et al., 2019) failed to calculate for the following reasons:   The patient has a prior MI or stroke diagnosis  ---------------------------------------------------------------------------------------------------   Medications: Outpatient Medications Prior to Visit  Medication Sig   albuterol (VENTOLIN HFA) 108 (90 Base) MCG/ACT inhaler Inhale 2 puffs into the lungs every 6 (six) hours as needed for wheezing or shortness of breath.   apixaban (ELIQUIS) 5 MG  TABS tablet Take 1 tablet (5 mg total) by mouth 2 (two) times daily.   atorvastatin (LIPITOR) 40 MG tablet TAKE 1 TABLET BY MOUTH EVERY DAY   B-D INS SYRINGE 0.5CC/31GX5/16 31G X 5/16" 0.5 ML MISC    denosumab (PROLIA) 60 MG/ML SOSY injection Inject 60 mg into the skin every 6 (six) months.   Dulaglutide (TRULICITY) 1.5 MG/0.5ML SOPN Inject 1.5 mg into the skin every 7  (seven) days. (Monday Nights)   fluticasone (FLONASE) 50 MCG/ACT nasal spray Place 2 sprays into both nostrils daily.   gabapentin (NEURONTIN) 300 MG capsule Take 1 capsule (300 mg total) by mouth daily. (Patient taking differently: Take 300 mg by mouth 2 (two) times daily.)   glucose blood test strip OneTouch Verio strips   glyBURIDE-metformin (GLUCOVANCE) 5-500 MG per tablet Take 2 tablets by mouth 2 (two) times daily.    hydrochlorothiazide (HYDRODIURIL) 25 MG tablet Take 25 mg by mouth daily.   insulin aspart (NOVOLOG) 100 UNIT/ML injection Inject up to 100 units daily in insulin pump   Insulin Disposable Pump (OMNIPOD DASH PODS, GEN 4,) MISC USE 1 POD EACH EVERY 72    HOURS   Insulin Human (INSULIN PUMP) SOLN Per endocrinoloy   loratadine (CLARITIN) 10 MG tablet Take 1 tablet by mouth daily.   losartan (COZAAR) 25 MG tablet Take 2 tablets (50 mg total) by mouth daily.   mupirocin ointment (BACTROBAN) 2 % mupirocin 2 % topical ointment   ondansetron (ZOFRAN) 4 MG tablet Take 1 tablet (4 mg total) by mouth every 8 (eight) hours as needed for nausea or vomiting.   PARoxetine (PAXIL) 40 MG tablet Take 1 tablet (40 mg total) by mouth daily.   Spacer/Aero-Holding Chambers [facility] DIAMOND) MISC Use with inhaler to ensure medication is delivered throughout lungs   traMADol (ULTRAM) 50 MG tablet Take 50 mg by mouth 2 (two) times daily as needed.   Vitamin D, Ergocalciferol, (DRISDOL) 1.25 MG (50000 UT) CAPS capsule Take 50,000 Units by mouth once a week. (Sundays)   insulin NPH-regular Human (NOVOLIN 70/30) (70-30) 100 UNIT/ML injection Novolin 70/30 U-100 Insulin 100 unit/mL subcutaneous suspension  INJECT 20 UNITS BEFORE MORNING MEAL AND INJECT 60 UNITS BEFORE EVENING MEAL   [DISCONTINUED] aspirin EC 81 MG tablet Take 81 mg by mouth daily. Swallow whole. (Patient not taking: Reported on 04/13/2021)   [DISCONTINUED] guaiFENesin-dextromethorphan (ROBITUSSIN DM) 100-10 MG/5ML syrup Take 10 mLs by  mouth every 4 (four) hours as needed for cough. (Patient not taking: Reported on 04/13/2021)   [DISCONTINUED] Insulin Disposable Pump (OMNIPOD DASH PODS, GEN 4,) MISC Inject into the skin. (Patient not taking: Reported on 04/13/2021)   No facility-administered medications prior to visit.    Review of Systems  Constitutional:  Positive for fatigue.  Respiratory:  Positive for shortness of breath.   Cardiovascular:  Positive for leg swelling.  Musculoskeletal:  Positive for back pain, gait problem and myalgias.       Objective    BP 128/70 (BP Location: Left Arm, Cuff Size: Large)   Pulse 80   Temp 97.8 F (36.6 C) (Temporal)   Resp 16   Wt 233 lb 3.2 oz (105.8 kg)   SpO2 90%   BMI 37.64 kg/m  BP Readings from Last 3 Encounters:  04/13/21 128/70  12/08/20 (!) 169/100  10/20/20 115/72   Wt Readings from Last 3 Encounters:  04/13/21 233 lb 3.2 oz (105.8 kg)  12/08/20 230 lb (104.3 kg)  10/20/20 241 lb 11.2 oz (109.6 kg)  Physical Exam Vitals reviewed.  Constitutional:      General: She is not in acute distress.    Appearance: Normal appearance. She is well-developed. She is not diaphoretic.  HENT:     Head: Normocephalic and atraumatic.  Eyes:     General: No scleral icterus.    Conjunctiva/sclera: Conjunctivae normal.  Neck:     Thyroid: No thyromegaly.  Cardiovascular:     Rate and Rhythm: Normal rate and regular rhythm.     Pulses: Normal pulses.     Heart sounds: Normal heart sounds. No murmur heard. Pulmonary:     Effort: Pulmonary effort is normal. No respiratory distress.     Breath sounds: Normal breath sounds. No wheezing, rhonchi or rales.  Musculoskeletal:     Cervical back: Neck supple.     Right lower leg: No edema.     Left lower leg: No edema.     Comments: TTP over Lumbar and thoracic vertebrae and b/l hips, gait intact, but antalgic  Lymphadenopathy:     Cervical: No cervical adenopathy.  Skin:    General: Skin is warm and dry.      Findings: No rash.  Neurological:     Mental Status: She is alert and oriented to person, place, and time. Mental status is at baseline.  Psychiatric:        Mood and Affect: Mood normal.        Behavior: Behavior normal.      No results found for any visits on 04/13/21.  Assessment & Plan     Problem List Items Addressed This Visit       Cardiovascular and Mediastinum   Hypertension associated with diabetes (HCC) - Primary    Well controlled Continue current medications Reviewed recent metabolic panel      Relevant Medications   insulin NPH-regular Human (NOVOLIN 70/30) (70-30) 100 UNIT/ML injection     Endocrine   Hyperlipidemia associated with type 2 diabetes mellitus (HCC)    Previously well controlled Continue statin Repeat FLP and CMP annually      Relevant Medications   insulin NPH-regular Human (NOVOLIN 70/30) (70-30) 100 UNIT/ML injection   Type 2 diabetes mellitus with other diabetic kidney complication (HCC)    F/b Endocrinology Due for A1c, but upcoming appt with Endo Will defer further management to Endo      Relevant Medications   insulin NPH-regular Human (NOVOLIN 70/30) (70-30) 100 UNIT/ML injection     Genitourinary   CKD (chronic kidney disease), stage IV (HCC)    F/b nephrology Avoid nephotoxic meds        Other   Long term current use of insulin (HCC)    F/b endo      Morbid obesity due to excess calories (HCC)    Discussed importance of healthy weight management Discussed diet and exercise       Relevant Medications   insulin NPH-regular Human (NOVOLIN 70/30) (70-30) 100 UNIT/ML injection   Fall    Fall about 1 month ago Now with point tenderness over T and L spine and b/l hips H/o compression fractures Will get XRays to r/o acute injuries Pain management per Pain clinic - no changes to chronic tramadol      Relevant Orders   DG Hip Unilat W OR W/O Pelvis 2-3 Views Left   DG Hip Unilat W OR W/O Pelvis 2-3 Views Right   DG  Thoracic Spine W/Swimmers   DG Lumbar Spine Complete     Return in  about 2 months (around 06/13/2021) for BP f/u, With new PCP.      I, Shirlee Latch, MD, have reviewed all documentation for this visit. The documentation on 04/13/21 for the exam, diagnosis, procedures, and orders are all accurate and complete.   Cornelious Diven, Marzella Schlein, MD, MPH [facility]

## 2021-04-13 NOTE — Assessment & Plan Note (Signed)
Fall about 1 month ago ?Now with point tenderness over T and L spine and b/l hips ?H/o compression fractures ?Will get XRays to r/o acute injuries ?Pain management per Pain clinic - no changes to chronic tramadol ?

## 2021-04-13 NOTE — Assessment & Plan Note (Signed)
F/b Endocrinology ?Due for A1c, but upcoming appt with Endo ?Will defer further management to Endo ?

## 2021-04-13 NOTE — Assessment & Plan Note (Signed)
Well controlled Continue current medications Reviewed recent metabolic panel 

## 2021-04-13 NOTE — Patient Instructions (Signed)
Continue Paxil 40 mg daily ?Next appointment- 5/30 at 1:20 ?

## 2021-04-13 NOTE — Assessment & Plan Note (Signed)
F/b nephrology ?Avoid nephotoxic meds ?

## 2021-04-13 NOTE — Assessment & Plan Note (Signed)
Previously well controlled Continue statin Repeat FLP and CMP annually 

## 2021-04-13 NOTE — Assessment & Plan Note (Signed)
F/b endo ?

## 2021-04-13 NOTE — Assessment & Plan Note (Signed)
Discussed importance of healthy weight management Discussed diet and exercise  

## 2021-04-15 ENCOUNTER — Encounter: Payer: Self-pay | Admitting: Student in an Organized Health Care Education/Training Program

## 2021-04-15 ENCOUNTER — Ambulatory Visit
Payer: Medicare Other | Attending: Student in an Organized Health Care Education/Training Program | Admitting: Student in an Organized Health Care Education/Training Program

## 2021-04-15 ENCOUNTER — Other Ambulatory Visit: Payer: Self-pay

## 2021-04-15 VITALS — BP 147/84 | HR 80 | Temp 96.9°F | Resp 18 | Ht 66.0 in | Wt 230.0 lb

## 2021-04-15 DIAGNOSIS — M48061 Spinal stenosis, lumbar region without neurogenic claudication: Secondary | ICD-10-CM

## 2021-04-15 DIAGNOSIS — M48062 Spinal stenosis, lumbar region with neurogenic claudication: Secondary | ICD-10-CM

## 2021-04-15 DIAGNOSIS — S32010S Wedge compression fracture of first lumbar vertebra, sequela: Secondary | ICD-10-CM | POA: Diagnosis not present

## 2021-04-15 DIAGNOSIS — I63033 Cerebral infarction due to thrombosis of bilateral carotid arteries: Secondary | ICD-10-CM

## 2021-04-15 DIAGNOSIS — F17200 Nicotine dependence, unspecified, uncomplicated: Secondary | ICD-10-CM

## 2021-04-15 DIAGNOSIS — S22000A Wedge compression fracture of unspecified thoracic vertebra, initial encounter for closed fracture: Secondary | ICD-10-CM

## 2021-04-15 DIAGNOSIS — G894 Chronic pain syndrome: Secondary | ICD-10-CM | POA: Diagnosis not present

## 2021-04-15 MED ORDER — TRAMADOL HCL 50 MG PO TABS: 50.0000 mg | ORAL_TABLET | Freq: Two times a day (BID) | ORAL | 2 refills | Status: AC | PRN

## 2021-04-15 NOTE — Progress Notes (Signed)
Nursing Pain Medication Assessment:  ?Safety precautions to be maintained throughout the outpatient stay will include: orient to surroundings, keep bed in low position, maintain call bell within reach at all times, provide assistance with transfer out of bed and ambulation.  ?Medication Inspection Compliance: Pill count conducted under aseptic conditions, in front of the patient. Neither the pills nor the bottle was removed from the patient's sight at any time. Once count was completed pills were immediately returned to the patient in their original bottle. ? ?Medication: Tramadol (Ultram) ?Pill/Patch Count:  7 of 60 pills remain ?Pill/Patch Appearance: Markings consistent with prescribed medication ?Bottle Appearance: Standard pharmacy container. Clearly labeled. ?Filled Date: 30 / 28 / 2022 ?Last Medication intake:  Yesterday ?

## 2021-04-15 NOTE — Progress Notes (Signed)
PROVIDER NOTE: Information contained herein reflects review and annotations entered in association with encounter. Interpretation of such information and data should be left to medically-trained personnel. Information provided to patient can be located elsewhere in the medical record under "Patient Instructions". Document created using STT-dictation technology, any transcriptional errors that may result from process are unintentional.    Patient: Jocelyn Wells  Service Category: E/M  Provider: Gillis Santa, MD  DOB: [birth year]  DOS: 04/15/2021  Specialty: Interventional Pain Management  MRN: [id number]  Setting: Ambulatory outpatient  PCP: Jocelyn Sprout, FNP  Type: Established Patient    Referring Provider: Virginia Crews, MD  Location: Office  Delivery: Face-to-face     HPI  [name], a 66 y.o. year old female, is here today because of her Lumbar foraminal stenosis [[name]]. Jocelyn Wells primary complain today is Back Pain (low)  Last encounter: My last encounter with her was on 01/29/21 Pertinent problems: Jocelyn Wells has H/O: osteoarthritis; Anxiety, generalized; Arthritis, degenerative; Morbid obesity due to excess calories ([place]); Cerebrovascular accident (CVA) due to thrombosis of left middle cerebral artery ([name]); Cerebrovascular accident (CVA) due to bilateral thrombosis of carotid arteries ([name]); Compression fracture of L1 lumbar vertebra ([name]); Compression fracture of body of thoracic vertebra ([name]); Lumbar foraminal stenosis (RIGHT L1); and Spinal stenosis of lumbar region with neurogenic claudication (L4-L5) on their pertinent problem list. Pain Assessment: Severity of Chronic pain is reported as a 10-Worst pain ever/10. Location: Back Lower/radiates down both legs to knees. Onset: More than a month ago. Quality: Sharp, Stabbing. Timing: Intermittent. Modifying factor(s): sitting. Vitals:  height is 5' 6" (1.676 m) and weight is 230 lb (104.3 kg). Her temperature is 96.9 F  (36.1 C) (abnormal). Her blood pressure is 147/84 (abnormal) and her pulse is 80. Her respiration is 18 and oxygen saturation is 93%.   Reason for encounter: medication management.    Jocelyn Wells presents today for medication management.  Unfortunately she sustained a fall last week.  She did have thoracic lumbar and pelvic x-rays performed without any evidence of acute fractures.  She does have a history of chronic thoracic compression fractures. She also followed up with nephrology and had a kidney ultrasound done.  She states that she is still awaiting results. We will refill her tramadol as below.  No change in dose.  UDS up-to-date and appropriate.   Pharmacotherapy Assessment  Analgesic: Tramadol 50 mg twice daily as needed, quantity 60/month    Monitoring: Jocelyn Wells PMP: PDMP reviewed during this encounter.       Pharmacotherapy: No side-effects or adverse reactions reported. Compliance: No problems identified. Effectiveness: Clinically acceptable.  UDS:  Summary  Date Value Ref Range Status  10/02/2020 Note  Final    Comment:    ==================================================================== [employer] Select 13 (MW) ==================================================================== Test                             Result       Flag       Units  Drug Present and Declared for Prescription Verification   Tramadol                       >8065        EXPECTED   ng/mg creat   O-Desmethyltramadol            6076         EXPECTED   ng/mg creat   N-Desmethyltramadol  2955         EXPECTED   ng/mg creat    Source of tramadol is a prescription medication. O-desmethyltramadol    and N-desmethyltramadol are expected metabolites of tramadol.  ==================================================================== Test                      Result    Flag   Units      Ref Range   Creatinine              62               mg/dL       >=20 ==================================================================== Declared Medications:  The flagging and interpretation on this report are based on the  following declared medications.  Unexpected results may arise from  inaccuracies in the declared medications.   **Note: The testing scope of this panel includes these medications:   Tramadol (Ultram)   **Note: The testing scope of this panel does not include the  following reported medications:   Aspirin  Atorvastatin (Lipitor)  Denosumab (Prolia)  Dulaglutide (Trulicity)  Gabapentin (Neurontin)  Glyburide (Glucovance)  Hydrochlorothiazide (Hydrodiuril)  Insulin  Losartan (Cozaar)  Metformin (Glucovance)  Mupirocin (Bactroban)  Paroxetine (Paxil)  Vitamin D2 (Drisdol) ==================================================================== For clinical consultation, please call [phone]. ====================================================================       ROS  Constitutional: Denies any fever or chills Gastrointestinal: No reported hemesis, hematochezia, vomiting, or acute GI distress Musculoskeletal:  Mid back, low back pain Neurological: No reported episodes of acute onset apraxia, aphasia, dysarthria, agnosia, amnesia, paralysis, loss of coordination, or loss of consciousness  Medication Review  Dulaglutide, Insulin Syringe-Needle U-100, Omnipod DASH Pods (Gen 4), PARoxetine, Vitamin D (Ergocalciferol), albuterol, apixaban, atorvastatin, denosumab, fluticasone, gabapentin, glucose blood, glyBURIDE-metformin, hydrochlorothiazide, insulin NPH-regular Human, insulin aspart, insulin pump, loratadine, losartan, mupirocin ointment, ondansetron, optichamber diamond, and traMADol  History Review  Allergy: Ms. [name] is allergic to codeine. Drug: Ms. [name]  reports no history of drug use. Alcohol:  reports no history of alcohol use. Tobacco:  reports that she has been smoking cigarettes. She started  smoking about 47 years ago. She has a 36.00 pack-year smoking history. She has never used smokeless tobacco. Social: Ms. Ganesh  reports that she has been smoking cigarettes. She started smoking about 47 years ago. She has a 36.00 pack-year smoking history. She has never used smokeless tobacco. She reports that she does not drink alcohol and does not use drugs. Medical:  has a past medical history of Anxiety, Cardiomyopathy ([place]), Chronic kidney disease, COPD (chronic obstructive pulmonary disease) ([place]), Depression, Diabetes mellitus, type II ([place]), HTN (hypertension), Obesity, Osteoporosis, PONV (postoperative nausea and vomiting), and Stroke ([place]). Surgical: Ms. Graefe  has a past surgical history that includes Abdominal hysterectomy; Tubal ligation (1978); Cesarean section (1978); Knee arthroscopy (Left, 2005); Ulnar nerve transposition (2008); Bilateral salpingoophorectomy (2000); Ankle fracture surgery (Left, 2002); TEE without cardioversion (N/A, 01/31/2015); Cardiac catheterization (N/A, 03/25/2015); Cardiac catheterization (N/A, 08/05/2015); Esophagogastroduodenoscopy (egd) with propofol (N/A, 09/22/2016); Colonoscopy with propofol (N/A, 09/22/2016); and Esophagogastroduodenoscopy (egd) with propofol (N/A, 12/08/2016). Family: family history includes Alcohol abuse in her paternal aunt; Aneurysm in her father; Asthma in her mother; COPD in her brother; Diabetes in her brother and mother; Heart disease in her father and mother.  Laboratory Chemistry Profile   Renal Lab Results  Component Value Date   BUN 37 (H) 10/20/2020   CREATININE 2.62 (H) 10/20/2020   BCR 14 10/20/2020   GFRAA 33 (L) 04/23/2019   GFRNONAA  31 (L) 06/01/2020     Hepatic Lab Results  Component Value Date   AST 8 10/20/2020   ALT 9 10/20/2020   ALBUMIN 3.6 (L) 10/20/2020   ALKPHOS 98 10/20/2020     Electrolytes Lab Results  Component Value Date   NA 141 10/20/2020   K 4.6 10/20/2020   CL 103 10/20/2020   CALCIUM  8.6 (L) 10/20/2020     Bone No results found for: VD25OH, [id number], [id number], [id number], 25OHVITD1, 25OHVITD2, 25OHVITD3, TESTOFREE, TESTOSTERONE   Inflammation (CRP: Acute Phase) (ESR: Chronic Phase) Lab Results  Component Value Date   ESRSEDRATE 23 10/20/2020   LATICACIDVEN 1.4 05/31/2020       Note: Above Lab results reviewed.  Recent Imaging Review  DG Thoracic Spine W/Swimmers CLINICAL DATA:  Fall 1 month ago.  Pain.  EXAM: THORACIC SPINE - 3 VIEWS  COMPARISON:  None.  FINDINGS: Remote compression fracture of T12, unchanged. No acute fractures. Multilevel degenerative changes. No malalignment. No other acute abnormalities.  IMPRESSION: Degenerative changes as above. Remote compression fracture of T12, stable. No other interval changes.  Electronically Signed   By: Dorise Bullion III M.D.   On: 04/13/2021 19:58 DG Lumbar Spine Complete CLINICAL DATA:  Fall 1 month ago.  Pain.  EXAM: LUMBAR SPINE - COMPLETE 4+ VIEW  COMPARISON:  November 15, 2017  FINDINGS: Calcifications project in the right upper quadrant were noted to represent gallstones on the prior study. No definite renal stones. Soft tissues otherwise unremarkable.  No malalignment. Loss of height of T12 anteriorly, unchanged since October 2019 consistent with previous trauma. No acute fractures are noted. Mild multilevel degenerative disc disease. Mild lower lumbar facet degenerative changes. Calcified atherosclerosis in the abdominal aorta.  IMPRESSION: 1. Gallstones. 2. Remote compression fracture of T12 anteriorly. 3. Degenerative changes as above. 4. Calcified atherosclerosis in the abdominal aorta.  Electronically Signed   By: Dorise Bullion III M.D.   On: 04/13/2021 19:57 DG Hip Unilat W OR W/O Pelvis 2-3 Views Right CLINICAL DATA:  [place] 1 month ago.  Bilateral hip pain.  EXAM: DG HIP (WITH OR WITHOUT PELVIS) 2-3V LEFT; DG HIP (WITH OR WITHOUT PELVIS) 2-3V RIGHT  COMPARISON:   None.  FINDINGS: The bilateral sacroiliac common bilateral femoroacetabular and pubic symphysis joint spaces are maintained. No acute fracture is seen. No dislocation.  IMPRESSION: Normal pelvis and bilateral hip radiographs.  Electronically Signed   By: Yvonne Kendall M.D.   On: 04/13/2021 16:15 DG Hip Unilat W OR W/O Pelvis 2-3 Views Left CLINICAL DATA:  [place] 1 month ago.  Bilateral hip pain.  EXAM: DG HIP (WITH OR WITHOUT PELVIS) 2-3V LEFT; DG HIP (WITH OR WITHOUT PELVIS) 2-3V RIGHT  COMPARISON:  None.  FINDINGS: The bilateral sacroiliac common bilateral femoroacetabular and pubic symphysis joint spaces are maintained. No acute fracture is seen. No dislocation.  IMPRESSION: Normal pelvis and bilateral hip radiographs.  Electronically Signed   By: Yvonne Kendall M.D.   On: 04/13/2021 16:15  Note: Reviewed        Physical Exam  General appearance: Well nourished, well developed, and well hydrated. In no apparent acute distress Mental status: Alert, oriented x 3 (person, place, & time)       Respiratory: No evidence of acute respiratory distress Eyes: PERLA Vitals: BP (!) 147/84    Pulse 80    Temp (!) 96.9 F (36.1 C)    Resp 18    Ht 5' 6" (1.676 m)    Wt 230 lb (  104.3 kg)    SpO2 93%    BMI 37.12 kg/m  BMI: Estimated body mass index is 37.12 kg/m as calculated from the following:   Height as of this encounter: 5' 6" (1.676 m).   Weight as of this encounter: 230 lb (104.3 kg). Ideal: Ideal body weight: 59.3 kg (130 lb 11.7 oz) Adjusted ideal body weight: 77.3 kg (170 lb 7 oz)   Lumbar Spine Area Exam  Skin & Axial Inspection: No masses, redness, or swelling Alignment: Symmetrical Functional ROM: Pain restricted ROM affecting both sides Stability: No instability detected Muscle Tone/Strength: Functionally intact. No obvious neuro-muscular anomalies detected. Sensory (Neurological): Dermatomal pain pattern and neurogenic Palpation: No palpable anomalies        Provocative Tests: Hyperextension/rotation test: (+) bilaterally for facet joint pain. Lumbar quadrant test (Kemp's test): (+) bilateral for foraminal stenosis   Gait & Posture Assessment  Ambulation: Patient ambulates using a cane Gait: Significantly limited. Dependent on assistive device to ambulate Posture: Difficulty standing up straight, due to pain  Lower Extremity Exam      Side: Right lower extremity   Side: Left lower extremity  Stability: No instability observed           Stability: No instability observed          Skin & Extremity Inspection: Skin color, temperature, and hair growth are WNL. No peripheral edema or cyanosis. No masses, redness, swelling, asymmetry, or associated skin lesions. No contractures.   Skin & Extremity Inspection: Skin color, temperature, and hair growth are WNL. No peripheral edema or cyanosis. No masses, redness, swelling, asymmetry, or associated skin lesions. No contractures.  Functional ROM: Pain restricted ROM for hip and knee joints           Functional ROM: Pain restricted ROM for hip and knee joints          Muscle Tone/Strength: Functionally intact. No obvious neuro-muscular anomalies detected.   Muscle Tone/Strength: Functionally intact. No obvious neuro-muscular anomalies detected.  Sensory (Neurological): Musculoskeletal pain pattern         Sensory (Neurological): Musculoskeletal pain pattern        DTR: Patellar: deferred today Achilles: deferred today Plantar: deferred today   DTR: Patellar: deferred today Achilles: deferred today Plantar: deferred today  Palpation: No palpable anomalies   Palpation: No palpable anomalies     Assessment   Status Diagnosis  Persistent Persistent Persistent 1. Lumbar foraminal stenosis (RIGHT L1)   2. Spinal stenosis of lumbar region with neurogenic claudication (L4-L5)   3. Compression fracture of body of thoracic vertebra (HCC)   4. Compression fracture of L1 vertebra, sequela   5.  Cerebrovascular accident (CVA) due to bilateral thrombosis of carotid arteries ([place])   6. Tobacco use disorder   7. Chronic pain syndrome         Plan of Care   [name] has a current medication list which includes the following long-term medication(s): albuterol, apixaban, atorvastatin, fluticasone, gabapentin, glyburide-metformin, hydrochlorothiazide, insulin pump, loratadine, losartan, paroxetine, and optichamber diamond.   Pharmacotherapy (Medications Ordered): Meds ordered this encounter  Medications   traMADol (ULTRAM) 50 MG tablet    Sig: Take 1 tablet (50 mg total) by mouth 2 (two) times daily as needed.    Dispense:  60 tablet    Refill:  2    Follow-up plan:   Return for patient will call to schedule her F2F med refill.   Recent Visits No visits were found meeting these conditions. Showing  recent visits within past 90 days and meeting all other requirements Today's Visits Date Type Provider Dept  04/15/21 Office Visit Jocelyn Santa, MD [facility]  Showing today's visits and meeting all other requirements Future Appointments No visits were found meeting these conditions. Showing future appointments within next 90 days and meeting all other requirements  I discussed the assessment and treatment plan with the patient. The patient was provided an opportunity to ask questions and all were answered. The patient agreed with the plan and demonstrated an understanding of the instructions.  Patient advised to call back or seek an in-person evaluation if the symptoms or condition worsens.  Duration of encounter: 38mnutes.  Note by: BGillis Santa MD Date: 04/15/2021; Time: 2:29 PM

## 2021-04-16 ENCOUNTER — Ambulatory Visit: Payer: Medicare Other | Admitting: Family Medicine

## 2021-04-17 DIAGNOSIS — Z9641 Presence of insulin pump (external) (internal): Secondary | ICD-10-CM | POA: Diagnosis not present

## 2021-04-17 DIAGNOSIS — Z794 Long term (current) use of insulin: Secondary | ICD-10-CM | POA: Diagnosis not present

## 2021-04-17 DIAGNOSIS — E113393 Type 2 diabetes mellitus with moderate nonproliferative diabetic retinopathy without macular edema, bilateral: Secondary | ICD-10-CM | POA: Diagnosis not present

## 2021-04-17 DIAGNOSIS — E1159 Type 2 diabetes mellitus with other circulatory complications: Secondary | ICD-10-CM | POA: Diagnosis not present

## 2021-04-17 DIAGNOSIS — R809 Proteinuria, unspecified: Secondary | ICD-10-CM | POA: Diagnosis not present

## 2021-04-17 DIAGNOSIS — E1129 Type 2 diabetes mellitus with other diabetic kidney complication: Secondary | ICD-10-CM | POA: Diagnosis not present

## 2021-04-17 DIAGNOSIS — M810 Age-related osteoporosis without current pathological fracture: Secondary | ICD-10-CM | POA: Diagnosis not present

## 2021-04-17 DIAGNOSIS — E1142 Type 2 diabetes mellitus with diabetic polyneuropathy: Secondary | ICD-10-CM | POA: Diagnosis not present

## 2021-04-17 DIAGNOSIS — E1121 Type 2 diabetes mellitus with diabetic nephropathy: Secondary | ICD-10-CM | POA: Diagnosis not present

## 2021-04-29 DIAGNOSIS — I639 Cerebral infarction, unspecified: Secondary | ICD-10-CM | POA: Diagnosis not present

## 2021-04-29 DIAGNOSIS — I129 Hypertensive chronic kidney disease with stage 1 through stage 4 chronic kidney disease, or unspecified chronic kidney disease: Secondary | ICD-10-CM | POA: Diagnosis not present

## 2021-04-29 DIAGNOSIS — F32A Depression, unspecified: Secondary | ICD-10-CM | POA: Diagnosis not present

## 2021-04-29 DIAGNOSIS — N2581 Secondary hyperparathyroidism of renal origin: Secondary | ICD-10-CM | POA: Diagnosis not present

## 2021-04-29 DIAGNOSIS — R809 Proteinuria, unspecified: Secondary | ICD-10-CM | POA: Diagnosis not present

## 2021-04-29 DIAGNOSIS — I10 Essential (primary) hypertension: Secondary | ICD-10-CM | POA: Diagnosis not present

## 2021-04-29 DIAGNOSIS — N184 Chronic kidney disease, stage 4 (severe): Secondary | ICD-10-CM | POA: Diagnosis not present

## 2021-04-29 DIAGNOSIS — D631 Anemia in chronic kidney disease: Secondary | ICD-10-CM | POA: Diagnosis not present

## 2021-04-29 DIAGNOSIS — E1122 Type 2 diabetes mellitus with diabetic chronic kidney disease: Secondary | ICD-10-CM | POA: Diagnosis not present

## 2021-04-29 DIAGNOSIS — U071 COVID-19: Secondary | ICD-10-CM | POA: Diagnosis not present

## 2021-04-29 DIAGNOSIS — I509 Heart failure, unspecified: Secondary | ICD-10-CM | POA: Diagnosis not present

## 2021-04-29 DIAGNOSIS — E663 Overweight: Secondary | ICD-10-CM | POA: Diagnosis not present

## 2021-05-04 ENCOUNTER — Ambulatory Visit: Payer: Medicare Other

## 2021-05-04 DIAGNOSIS — L600 Ingrowing nail: Secondary | ICD-10-CM | POA: Diagnosis not present

## 2021-05-04 DIAGNOSIS — Z794 Long term (current) use of insulin: Secondary | ICD-10-CM | POA: Diagnosis not present

## 2021-05-04 DIAGNOSIS — B351 Tinea unguium: Secondary | ICD-10-CM | POA: Diagnosis not present

## 2021-05-04 DIAGNOSIS — E1129 Type 2 diabetes mellitus with other diabetic kidney complication: Secondary | ICD-10-CM

## 2021-05-04 DIAGNOSIS — J449 Chronic obstructive pulmonary disease, unspecified: Secondary | ICD-10-CM

## 2021-05-04 DIAGNOSIS — Z9181 History of falling: Secondary | ICD-10-CM

## 2021-05-04 DIAGNOSIS — L851 Acquired keratosis [keratoderma] palmaris et plantaris: Secondary | ICD-10-CM | POA: Diagnosis not present

## 2021-05-04 DIAGNOSIS — E1140 Type 2 diabetes mellitus with diabetic neuropathy, unspecified: Secondary | ICD-10-CM | POA: Diagnosis not present

## 2021-05-04 NOTE — Chronic Care Management (AMB) (Addendum)
Chronic Care Management   CCM RN Visit Note  05/04/2021 Name: Jocelyn Wells MRN: [id number] DOB: 1955-03-08  Subjective: Jocelyn Wells is a 66 y.o. year old female who is a primary care patient of Gwyneth Sprout, FNP. The care management team was consulted for assistance with disease management and care coordination needs.    Engaged with patient by telephone for follow up visit in response to provider referral for case management and care coordination services.   Consent to Services:  The patient was given information about Chronic Care Management services, agreed to services, and gave verbal consent prior to initiation of services.  Please see initial visit note for detailed documentation.   Assessment: Review of patient past medical history, allergies, medications, health status, including review of consultants reports, laboratory and other test data, was performed as part of comprehensive evaluation and provision of chronic care management services.   SDOH (Social Determinants of Health) assessments and interventions performed: No  CCM Care Plan  Allergies  Allergen Reactions   Codeine Nausea And Vomiting    Outpatient Encounter Medications as of 05/04/2021  Medication Sig Note   albuterol (VENTOLIN HFA) 108 (90 Base) MCG/ACT inhaler Inhale 2 puffs into the lungs every 6 (six) hours as needed for wheezing or shortness of breath.    apixaban (ELIQUIS) 5 MG TABS tablet Take 1 tablet (5 mg total) by mouth 2 (two) times daily.    atorvastatin (LIPITOR) 40 MG tablet TAKE 1 TABLET BY MOUTH EVERY DAY    B-D INS SYRINGE 0.5CC/31GX5/16 31G X 5/16" 0.5 ML MISC     denosumab (PROLIA) 60 MG/ML SOSY injection Inject 60 mg into the skin every 6 (six) months.    Dulaglutide (TRULICITY) 1.5 OQ/9.4TM SOPN Inject 1.5 mg into the skin every 7 (seven) days. (Monday Nights)    fluticasone (FLONASE) 50 MCG/ACT nasal spray Place 2 sprays into both nostrils daily.    gabapentin (NEURONTIN) 300 MG  capsule Take 1 capsule (300 mg total) by mouth daily. (Patient taking differently: Take 300 mg by mouth 2 (two) times daily.) 08/15/2020: Reports taking one capsule every night and being informed to take an additional capsule in the morning if needed.   glucose blood test strip OneTouch Verio strips    glyBURIDE-metformin (GLUCOVANCE) 5-500 MG per tablet Take 2 tablets by mouth 2 (two) times daily.     hydrochlorothiazide (HYDRODIURIL) 25 MG tablet Take 25 mg by mouth daily.    insulin aspart (NOVOLOG) 100 UNIT/ML injection Inject up to 100 units daily in insulin pump    Insulin Disposable Pump (OMNIPOD DASH PODS, GEN 4,) MISC USE 1 POD EACH EVERY 72    HOURS    Insulin Human (INSULIN PUMP) SOLN Per endocrinoloy    insulin NPH-regular Human (NOVOLIN 70/30) (70-30) 100 UNIT/ML injection Novolin 70/30 U-100 Insulin 100 unit/mL subcutaneous suspension  INJECT 20 UNITS BEFORE MORNING MEAL AND INJECT 60 UNITS BEFORE EVENING MEAL    loratadine (CLARITIN) 10 MG tablet Take 1 tablet by mouth daily.    losartan (COZAAR) 25 MG tablet Take 2 tablets (50 mg total) by mouth daily.    mupirocin ointment (BACTROBAN) 2 % mupirocin 2 % topical ointment    ondansetron (ZOFRAN) 4 MG tablet Take 1 tablet (4 mg total) by mouth every 8 (eight) hours as needed for nausea or vomiting.    PARoxetine (PAXIL) 40 MG tablet Take 1 tablet (40 mg total) by mouth daily.    Spacer/Aero-Holding [facility] DIAMOND) MISC Use  with inhaler to ensure medication is delivered throughout lungs    traMADol (ULTRAM) 50 MG tablet Take 1 tablet (50 mg total) by mouth 2 (two) times daily as needed.    Vitamin D, Ergocalciferol, (DRISDOL) 1.25 MG (50000 UT) CAPS capsule Take 50,000 Units by mouth once a week. (Sundays)    No facility-administered encounter medications on file as of 05/04/2021.    Patient Active Problem List   Diagnosis Date Noted   Fall 04/13/2021   Poor appetite 02/13/2021   MDD (major depressive disorder),  recurrent episode, mild ([place]) 02/13/2021   Benign hypertensive kidney disease with chronic kidney disease 10/14/2020   CKD (chronic kidney disease), stage IV ([place]) 05/31/2020   Compression fracture of L1 lumbar vertebra ([place]) 01/30/2019   Compression fracture of body of thoracic vertebra (HCC) 01/30/2019   Lumbar foraminal stenosis (RIGHT L1) 01/30/2019   Spinal stenosis of lumbar region with neurogenic claudication (L4-L5) 01/30/2019   Osteoporosis, post-menopausal 04/14/2018   Vitamin D deficiency 04/14/2018   Chronic deep vein thrombosis (DVT) ([place]) 02/09/2018   Post-menopausal 11/17/2017   Aortic atherosclerosis ([place]) 11/08/2017   Cerebrovascular accident (CVA) due to bilateral thrombosis of carotid arteries ([place]) 06/12/2015   Snoring 04/24/2015   Sleep paralysis, recurrent isolated 04/24/2015   Hypersomnia with sleep apnea 04/24/2015   Cataplexy 04/24/2015   Morbid obesity due to excess calories ([place]) 04/24/2015   COPD (chronic obstructive pulmonary disease) ([place]) 04/24/2015   Cerebrovascular accident (CVA) due to thrombosis of left middle cerebral artery ([place]) 22/29/7989   Embolic stroke ([place]) 21/19/4174   Chronic systolic heart failure ([place]) 03/25/2015   Dizziness and giddiness 07/05/2014   Hypertension associated with diabetes ([place]) 07/05/2014   Carbuncle and furuncle 07/05/2014   Pain of perianal area 07/05/2014   Guttate psoriasis 07/05/2014   CAFL (chronic airflow limitation) ([place]) 06/05/2014   H/O: osteoarthritis 06/05/2014   Anxiety, generalized 06/05/2014   Microalbuminuria 12/23/2013   Long term current use of insulin ([place]) 12/23/2013   Compulsive tobacco user syndrome 12/23/2013   Type 2 diabetes mellitus with other diabetic kidney complication ([place]) 09/21/4816   Disturbance of skin sensation 09/02/2006   Difficulty hearing 07/25/2006   Acute onset aura migraine 06/27/2006   Cephalalgia 05/27/2006   Hyperlipidemia associated with type 2 diabetes mellitus ([place])  10/22/2005   Arthritis, degenerative 10/22/2005     Patient Care Plan: RN Care Management Plan of Care     Problem Identified: CHF, COPD, DM, HTN and Fall Risk      Long-Range Goal: Disease Progression Prevented or Minimized   Start Date: 01/30/2021  Expected End Date: 04/30/2021  Priority: High  Note:   Current Barriers:  Chronic Disease Management support and education needs related to CHF, HTN, COPD, DMII, and Fall Risk  RNCM Clinical Goal(s):  Patient will demonstrate Ongoing adherence to prescribed treatment plan for CHF, HTN, COPD, DMII, and Fall Risk through collaboration with the provider, RN Care Manager and the care team.   Interventions: 1:1 collaboration with primary care provider regarding development and update of comprehensive plan of care as evidenced by provider attestation and co-signature Inter-disciplinary care team collaboration (see longitudinal plan of care) Evaluation of current treatment plan related to  self management and patient's adherence to plan as established by provider   Heart Failure Interventions:   Reviewed plan for CHF management.  Reviewed weight parameters. Reports not weighing daily d/t difficulty standing and maintaining balance r/t joint pain. Reports minimal edema to her lower extremities. Denies abdominal tightness of edema.  Reports changes in activity tolerance are d/t joint discomfort. Continues to experience shortness of breath with exertion. Denies complaints of shortness of breath at rest.  Reviewed s/sx of symptom overload. She is aware of indications for notifying a provider. Reviewed worsening s/sx that require immediate medical attention.  Diabetes Interventions:  Reviewed plan for diabetes management. Reports compliance with the treatment plan. Reviewed blood glucose readings. Recalls her device alarming d/t a few low readings bur overall reports readings have improved. Continues to monitor intake and follow recommendations as  outlined by the Endocrinology and Nephrology team.   Fall Risk Interventions  Reviewed safety and fall prevention measures. Denies fall since the last outreach. She continues to experience discomfort pain in her lower extremities. Reports difficulty standing and bearing weight for prolonged periods. Reports being evaluated by the [facility] team.  Reviewed availability of safety devices in the home. Reports having a cane and rollator walker. Confirmed having a shower seat to avoid prolonged standing during showers. Discussed ability to perform ADLs. Reports using caution with all activity. Declines current need for additional assistance. Agreed to update the care team if this changes. Discussed plan for follow up with the Ortho team. Reports being advised to continue Tramadol for pain management. Reports pending feedback from the team regarding need for additional imaging.   Patient Goals/Self-Care Activities: Take all medications as prescribed Attend all scheduled provider appointments Call pharmacy for medication refills 3-7 days in advance of running out of medications Call provider office for new concerns or questions         PLAN A member of the care management team will follow up within the next two weeks.   Crist[facility] Care Management [facility] [phone]

## 2021-05-08 DIAGNOSIS — E1129 Type 2 diabetes mellitus with other diabetic kidney complication: Secondary | ICD-10-CM

## 2021-05-08 DIAGNOSIS — J449 Chronic obstructive pulmonary disease, unspecified: Secondary | ICD-10-CM

## 2021-05-11 ENCOUNTER — Telehealth: Payer: Self-pay

## 2021-05-11 ENCOUNTER — Telehealth: Payer: Medicare Other

## 2021-05-11 NOTE — Telephone Encounter (Signed)
?  Care Management  ? ?Follow Up Note ? ? ?05/11/2021 ?Name: Jocelyn Wells MRN: [id number] DOB: 01-05-1956 ? ? ?Primary Care Provider: Gwyneth Sprout, FNP ?Reason for referral : Chronic Care Management ? ? ?An unsuccessful telephone outreach was attempted today. The patient was referred to the case management team for assistance with care management and care coordination.  ? ?Follow Up Plan:  ?A HIPAA compliant voice message was left today requesting a return call. ? ? ?Marlin Brys,RN ?[place]/[facility] Care Management ?[place] ?[phone] ? ?

## 2021-05-20 ENCOUNTER — Telehealth: Payer: Self-pay | Admitting: Family Medicine

## 2021-05-20 NOTE — Telephone Encounter (Signed)
Copied from [place] [id number]. Topic: Medicare AWV ?>> May 20, 2021  1:32 PM Cher Nakai R wrote: ?Reason for CRM:  ?Left message for patient to call back and schedule Medicare Annual Wellness Visit (AWV) in office.  ? ?If unable to come into the office for AWV,  please offer to do virtually or by telephone. ? ?Last AWV: 04/10/2018 ? ?Please schedule at anytime with [facility] Health Advisor. ? ?30 minute appointment for Virtual or phone ?45 minute appointment for in office or Initial virtual/phone ? ?Any questions, please contact me at [phone] ?

## 2021-05-27 ENCOUNTER — Ambulatory Visit (INDEPENDENT_AMBULATORY_CARE_PROVIDER_SITE_OTHER): Payer: Medicare Other

## 2021-05-27 ENCOUNTER — Telehealth: Payer: Self-pay

## 2021-05-27 VITALS — Wt 230.0 lb

## 2021-05-27 DIAGNOSIS — Z780 Asymptomatic menopausal state: Secondary | ICD-10-CM | POA: Diagnosis not present

## 2021-05-27 DIAGNOSIS — Z0000 Encounter for general adult medical examination without abnormal findings: Secondary | ICD-10-CM | POA: Diagnosis not present

## 2021-05-27 DIAGNOSIS — Z1211 Encounter for screening for malignant neoplasm of colon: Secondary | ICD-10-CM

## 2021-05-27 DIAGNOSIS — Z1231 Encounter for screening mammogram for malignant neoplasm of breast: Secondary | ICD-10-CM

## 2021-05-27 NOTE — Telephone Encounter (Signed)
Closed referral on accident reopened it  ?

## 2021-05-27 NOTE — Progress Notes (Signed)
?Virtual Visit via Telephone Note ? ?I connected with  Jocelyn Wells on 05/27/21 at  1:45 PM EDT by telephone and verified that I am speaking with the correct person using two identifiers. ? ?Location: ?Patient: home ?Provider: [facility] ?Persons participating in the virtual visit: patient/Nurse Health Advisor ?  ?I discussed the limitations, risks, security and privacy concerns of performing an evaluation and management service by telephone and the availability of in person appointments. The patient expressed understanding and agreed to proceed. ? ?Interactive audio and video telecommunications were attempted between this nurse and patient, however failed, due to patient having technical difficulties OR patient did not have access to video capability.  We continued and completed visit with audio only. ? ?Some vital signs may be absent or patient reported.  ? ?Dionisio David, LPN ? ?Subjective:  ? Jocelyn Wells is a 66 y.o. female who presents for Medicare Annual (Subsequent) preventive examination. ? ?Review of Systems    ? ?  ? ?   ?Objective:  ?  ?There were no vitals filed for this visit. ?There is no height or weight on file to calculate BMI. ? ? ?  04/15/2021  ?  2:00 PM 10/02/2020  ?  9:34 AM 05/31/2020  ? 10:55 AM 05/31/2020  ?  1:52 AM 12/06/2019  ?  2:43 PM 08/21/2019  ? 10:24 AM 01/29/2019  ?  3:45 PM  ?Advanced Directives  ?Does Patient Have a Medical Advance Directive? No No No No No No No  ?Would patient like information on creating a medical advance directive? No - Patient declined No - Patient declined No - Patient declined   No - Patient declined No - Patient declined  ? ? ?Current Medications (verified) ?Outpatient Encounter Medications as of 05/27/2021  ?Medication Sig  ? apixaban (ELIQUIS) 5 MG TABS tablet Take by mouth.  ? Dulaglutide 3 MG/0.5ML SOPN Inject into the skin.  ? fluticasone (FLONASE) 50 MCG/ACT nasal spray Place into the nose.  ? albuterol (VENTOLIN HFA) 108 (90 Base) MCG/ACT inhaler Inhale  2 puffs into the lungs every 6 (six) hours as needed for wheezing or shortness of breath.  ? apixaban (ELIQUIS) 5 MG TABS tablet Take 1 tablet (5 mg total) by mouth 2 (two) times daily.  ? atorvastatin (LIPITOR) 40 MG tablet TAKE 1 TABLET BY MOUTH EVERY DAY  ? B-D INS SYRINGE 0.5CC/31GX5/16 31G X 5/16" 0.5 ML MISC   ? calcipotriene (DOVONOX) 0.005 % cream calcipotriene 0.005 % topical cream  ? carvedilol (COREG) 12.5 MG tablet carvedilol 12.5 mg tablet  ? denosumab (PROLIA) 60 MG/ML SOSY injection Inject 60 mg into the skin every 6 (six) months.  ? diclofenac (VOLTAREN) 75 MG EC tablet diclofenac sodium 75 mg tablet,delayed release  ? Dulaglutide (TRULICITY) 1.5 IF/0.2DX SOPN Inject 1.5 mg into the skin every 7 (seven) days. (Monday Nights)  ? Exenatide ER (BYDUREON BCISE) 2 MG/0.85ML AUIJ Bydureon BCise 2 mg/0.85 mL subcutaneous auto-injector  ? fluticasone (FLONASE) 50 MCG/ACT nasal spray Place 2 sprays into both nostrils daily.  ? gabapentin (NEURONTIN) 300 MG capsule Take 1 capsule (300 mg total) by mouth daily. (Patient taking differently: Take 300 mg by mouth 2 (two) times daily.)  ? glucose blood test strip OneTouch Verio strips  ? glyBURIDE-metformin (GLUCOVANCE) 5-500 MG per tablet Take 2 tablets by mouth 2 (two) times daily.   ? hydrochlorothiazide (HYDRODIURIL) 25 MG tablet Take 25 mg by mouth daily.  ? influenza vac split quadrivalent PF (FLUARIX) 0.5 ML injection  Fluzone Quad 2018-19(PF) 60 mcg(15 mcgx4)/0.5 mL intramuscular syringe  ? insulin aspart (NOVOLOG) 100 UNIT/ML injection Inject up to 100 units daily in insulin pump  ? Insulin Disposable Pump (OMNIPOD DASH PODS, GEN 4,) MISC USE 1 POD EACH EVERY 72    HOURS  ? Insulin Human (INSULIN PUMP) SOLN Per endocrinoloy  ? insulin NPH-regular Human (NOVOLIN 70/30) (70-30) 100 UNIT/ML injection Novolin 70/30 U-100 Insulin 100 unit/mL subcutaneous suspension ? INJECT 20 UNITS BEFORE MORNING MEAL AND INJECT 60 UNITS BEFORE EVENING MEAL  ? loratadine  (CLARITIN) 10 MG tablet Take 1 tablet by mouth daily.  ? losartan (COZAAR) 25 MG tablet Take 2 tablets (50 mg total) by mouth daily.  ? mupirocin ointment (BACTROBAN) 2 % mupirocin 2 % topical ointment  ? ondansetron (ZOFRAN) 4 MG tablet Take 1 tablet (4 mg total) by mouth every 8 (eight) hours as needed for nausea or vomiting.  ? PARoxetine (PAXIL) 40 MG tablet Take 1 tablet (40 mg total) by mouth daily.  ? Semaglutide, 1 MG/DOSE, (OZEMPIC, 1 MG/DOSE,) 2 MG/1.5ML SOPN Ozempic 1 mg/dose (2 mg/1.5 mL) subcutaneous pen injector  ? Spacer/Aero-Holding [facility] DIAMOND) MISC Use with inhaler to ensure medication is delivered throughout lungs  ? traMADol (ULTRAM) 50 MG tablet Take 1 tablet (50 mg total) by mouth 2 (two) times daily as needed.  ? TRULICITY 3 WU/9.8JX SOPN Inject into the skin.  ? Vitamin D, Ergocalciferol, (DRISDOL) 1.25 MG (50000 UT) CAPS capsule Take 50,000 Units by mouth once a week. (Sundays)  ? ?No facility-administered encounter medications on file as of 05/27/2021.  ? ? ?Allergies (verified) ?Codeine  ? ?History: ?Past Medical History:  ?Diagnosis Date  ? Anxiety   ? Cardiomyopathy ([place])   ? new to her Jan 2017  ? Chronic kidney disease   ? COPD (chronic obstructive pulmonary disease) ([place])   ? Depression   ? Diabetes mellitus, type II ([place])   ? HTN (hypertension)   ? Obesity   ? Osteoporosis   ? PONV (postoperative nausea and vomiting)   ? Stroke [facility])   ? Jan 2017  ? ?Past Surgical History:  ?Procedure Laterality Date  ? ABDOMINAL HYSTERECTOMY    ? ANKLE FRACTURE SURGERY Left 2002  ? BILATERAL SALPINGOOPHORECTOMY  2000  ? CARDIAC CATHETERIZATION N/A 03/25/2015  ? Procedure: Right/Left Heart Cath and Coronary Angiography;  Surgeon: Belva Crome, MD;  Location: [facility] CV LAB;  Service: Cardiovascular;  Laterality: N/A;  ? [place]  ? COLONOSCOPY WITH PROPOFOL N/A 09/22/2016  ? Procedure: COLONOSCOPY WITH PROPOFOL;  Surgeon: Manya Silvas, MD;  Location: [facility]  ENDOSCOPY;  Service: Endoscopy;  Laterality: N/A;  ? EP IMPLANTABLE DEVICE N/A 08/05/2015  ? Procedure: Loop Recorder Insertion;  Surgeon: Deboraha Sprang, MD;  Location: [facility] CV LAB;  Service: Cardiovascular;  Laterality: N/A;  ? ESOPHAGOGASTRODUODENOSCOPY (EGD) WITH PROPOFOL N/A 09/22/2016  ? Procedure: ESOPHAGOGASTRODUODENOSCOPY (EGD) WITH PROPOFOL;  Surgeon: Manya Silvas, MD;  Location: [facility] ENDOSCOPY;  Service: Endoscopy;  Laterality: N/A;  ? ESOPHAGOGASTRODUODENOSCOPY (EGD) WITH PROPOFOL N/A 12/08/2016  ? Procedure: ESOPHAGOGASTRODUODENOSCOPY (EGD) WITH PROPOFOL;  Surgeon: Manya Silvas, MD;  Location: [facility] ENDOSCOPY;  Service: Endoscopy;  Laterality: N/A;  ? KNEE ARTHROSCOPY Left 2005  ? TEE WITHOUT CARDIOVERSION N/A 01/31/2015  ? Procedure: TRANSESOPHAGEAL ECHOCARDIOGRAM (TEE);  Surgeon: Lelon Perla, MD;  Location: [facility];  Service: Cardiovascular;  Laterality: N/A;  ? [place]  ? ULNAR NERVE TRANSPOSITION  2008  ? ?  Family History  ?Problem Relation Age of Onset  ? Heart disease Mother   ?     died from CHF  ? Asthma Mother   ? Diabetes Mother   ? Heart disease Father   ? Aneurysm Father   ? COPD Brother   ? Diabetes Brother   ? Alcohol abuse Paternal Aunt   ? Anemia Neg Hx   ? Arrhythmia Neg Hx   ? Clotting disorder Neg Hx   ? Fainting Neg Hx   ? Heart attack Neg Hx   ? Heart failure Neg Hx   ? Hyperlipidemia Neg Hx   ? Hypertension Neg Hx   ? ?Social History  ? ?Socioeconomic History  ? Marital status: Married  ?  Spouse name: Not on file  ? Number of children: 2  ? Years of education: Not on file  ? Highest education level: Bachelor's degree (e.g., BA, AB, BS)  ?Occupational History  ? Occupation: disabled  ?  Comment: retired  ?Tobacco Use  ? Smoking status: Some Days  ?  Packs/day: 1.00  ?  Years: 36.00  ?  Pack years: 36.00  ?  Types: Cigarettes  ?  Start date: 02/08/1974  ? Smokeless tobacco: Never  ? Tobacco comments:  ?  "I can not quit smoking, I have tried". 1  PPD  ?Vaping Use  ? Vaping Use: Never used  ?Substance and Sexual Activity  ? Alcohol use: No  ?  Alcohol/week: 0.0 standard drinks  ? Drug use: No  ? Sexual activity: Yes  ?  Birth control/protection: None  ?[place]

## 2021-05-27 NOTE — Telephone Encounter (Signed)
CALLED PATIENT NO ANSWER LEFT VOICEMAIL FOR A CALL BACK ? ?

## 2021-05-27 NOTE — Patient Instructions (Signed)
Ms. Joy , ?Thank you for taking time to come for your Medicare Wellness Visit. I appreciate your ongoing commitment to your health goals. Please review the following plan we discussed and let me know if I can assist you in the future.  ? ?Screening recommendations/referrals: ?Colonoscopy: 10/28/16, referral sent ?Mammogram: referral sent ?Bone Density: referral sent ?Recommended yearly ophthalmology/optometry visit for glaucoma screening and checkup ?Recommended yearly dental visit for hygiene and checkup ? ?Vaccinations: ?Influenza vaccine: 05/27/21 ?Pneumococcal vaccine: 12/23/10 ?Tdap vaccine: n/d ?Shingles vaccine: Shingrix 06/03/16, 12/11/16   ?Covid-19:1/21/2, 03/22/19 ? ?Advanced directives: no ? ?Conditions/risks identified: none ? ?Next appointment: Follow up in one year for your annual wellness visit 05/31/22 @ 2:30 by phone ? ? ?Preventive Care 27 Years and Older, Female ?Preventive care refers to lifestyle choices and visits with your health care provider that can promote health and wellness. ?What does preventive care include? ?A yearly physical exam. This is also called an annual well check. ?Dental exams once or twice a year. ?Routine eye exams. Ask your health care provider how often you should have your eyes checked. ?Personal lifestyle choices, including: ?Daily care of your teeth and gums. ?Regular physical activity. ?Eating a healthy diet. ?Avoiding tobacco and drug use. ?Limiting alcohol use. ?Practicing safe sex. ?Taking low-dose aspirin every day. ?Taking vitamin and mineral supplements as recommended by your health care provider. ?What happens during an annual well check? ?The services and screenings done by your health care provider during your annual well check will depend on your age, overall health, lifestyle risk factors, and family history of disease. ?Counseling  ?Your health care provider may ask you questions about your: ?Alcohol use. ?Tobacco use. ?Drug use. ?Emotional  well-being. ?Home and relationship well-being. ?Sexual activity. ?Eating habits. ?History of falls. ?Memory and ability to understand (cognition). ?Work and work Statistician. ?Reproductive health. ?Screening  ?You may have the following tests or measurements: ?Height, weight, and BMI. ?Blood pressure. ?Lipid and cholesterol levels. These may be checked every 5 years, or more frequently if you are over 20 years old. ?Skin check. ?Lung cancer screening. You may have this screening every year starting at age 27 if you have a 30-pack-year history of smoking and currently smoke or have quit within the past 15 years. ?Fecal occult blood test (FOBT) of the stool. You may have this test every year starting at age 33. ?Flexible sigmoidoscopy or colonoscopy. You may have a sigmoidoscopy every 5 years or a colonoscopy every 10 years starting at age 27. ?Hepatitis C blood test. ?Hepatitis B blood test. ?Sexually transmitted disease (STD) testing. ?Diabetes screening. This is done by checking your blood sugar (glucose) after you have not eaten for a while (fasting). You may have this done every 1-3 years. ?Bone density scan. This is done to screen for osteoporosis. You may have this done starting at age 54. ?Mammogram. This may be done every 1-2 years. Talk to your health care provider about how often you should have regular mammograms. ?Talk with your health care provider about your test results, treatment options, and if necessary, the need for more tests. ?Vaccines  ?Your health care provider may recommend certain vaccines, such as: ?Influenza vaccine. This is recommended every year. ?Tetanus, diphtheria, and acellular pertussis (Tdap, Td) vaccine. You may need a Td booster every 10 years. ?Zoster vaccine. You may need this after age 109. ?Pneumococcal 13-valent conjugate (PCV13) vaccine. One dose is recommended after age 81. ?Pneumococcal polysaccharide (PPSV23) vaccine. One dose is recommended after age 59. ?Talk  to your  health care provider about which screenings and vaccines you need and how often you need them. ?This information is not intended to replace advice given to you by your health care provider. Make sure you discuss any questions you have with your health care provider. ?Document Released: 02/21/2015 Document Revised: 10/15/2015 Document Reviewed: 11/26/2014 ?Elsevier Interactive Patient Education ? 2017 [place]. ? ?Fall Prevention in the Home ?Falls can cause injuries. They can happen to people of all ages. There are many things you can do to make your home safe and to help prevent falls. ?What can I do on the outside of my home? ?Regularly fix the edges of walkways and driveways and fix any cracks. ?Remove anything that might make you trip as you walk through a door, such as a raised step or threshold. ?Trim any bushes or trees on the path to your home. ?Use bright outdoor lighting. ?Clear any walking paths of anything that might make someone trip, such as rocks or tools. ?Regularly check to see if handrails are loose or broken. Make sure that both sides of any steps have handrails. ?Any raised decks and porches should have guardrails on the edges. ?Have any leaves, snow, or ice cleared regularly. ?Use sand or salt on walking paths during winter. ?Clean up any spills in your garage right away. This includes oil or grease spills. ?What can I do in the bathroom? ?Use night lights. ?Install grab bars by the toilet and in the tub and shower. Do not use towel bars as grab bars. ?Use non-skid mats or decals in the tub or shower. ?If you need to sit down in the shower, use a plastic, non-slip stool. ?Keep the floor dry. Clean up any water that spills on the floor as soon as it happens. ?Remove soap buildup in the tub or shower regularly. ?Attach bath mats securely with double-sided non-slip rug tape. ?Do not have throw rugs and other things on the floor that can make you trip. ?What can I do in the bedroom? ?Use night  lights. ?Make sure that you have a light by your bed that is easy to reach. ?Do not use any sheets or blankets that are too big for your bed. They should not hang down onto the floor. ?Have a firm chair that has side arms. You can use this for support while you get dressed. ?Do not have throw rugs and other things on the floor that can make you trip. ?What can I do in the kitchen? ?Clean up any spills right away. ?Avoid walking on wet floors. ?Keep items that you use a lot in easy-to-reach places. ?If you need to reach something above you, use a strong step stool that has a grab bar. ?Keep electrical cords out of the way. ?Do not use floor polish or wax that makes floors slippery. If you must use wax, use non-skid floor wax. ?Do not have throw rugs and other things on the floor that can make you trip. ?What can I do with my stairs? ?Do not leave any items on the stairs. ?Make sure that there are handrails on both sides of the stairs and use them. Fix handrails that are broken or loose. Make sure that handrails are as long as the stairways. ?Check any carpeting to make sure that it is firmly attached to the stairs. Fix any carpet that is loose or worn. ?Avoid having throw rugs at the top or bottom of the stairs. If you do have throw rugs,  attach them to the floor with carpet tape. ?Make sure that you have a light switch at the top of the stairs and the bottom of the stairs. If you do not have them, ask someone to add them for you. ?What else can I do to help prevent falls? ?Wear shoes that: ?Do not have high heels. ?Have rubber bottoms. ?Are comfortable and fit you well. ?Are closed at the toe. Do not wear sandals. ?If you use a stepladder: ?Make sure that it is fully opened. Do not climb a closed stepladder. ?Make sure that both sides of the stepladder are locked into place. ?Ask someone to hold it for you, if possible. ?Clearly mark and make sure that you can see: ?Any grab bars or handrails. ?First and last  steps. ?Where the edge of each step is. ?Use tools that help you move around (mobility aids) if they are needed. These include: ?Canes. ?Walkers. ?Scooters. ?Crutches. ?Turn on the lights when you go into a dark area. Replace

## 2021-05-28 ENCOUNTER — Telehealth: Payer: Self-pay

## 2021-05-28 DIAGNOSIS — I639 Cerebral infarction, unspecified: Secondary | ICD-10-CM | POA: Diagnosis not present

## 2021-05-28 DIAGNOSIS — F32A Depression, unspecified: Secondary | ICD-10-CM | POA: Diagnosis not present

## 2021-05-28 DIAGNOSIS — I509 Heart failure, unspecified: Secondary | ICD-10-CM | POA: Diagnosis not present

## 2021-05-28 DIAGNOSIS — N184 Chronic kidney disease, stage 4 (severe): Secondary | ICD-10-CM | POA: Diagnosis not present

## 2021-05-28 DIAGNOSIS — E663 Overweight: Secondary | ICD-10-CM | POA: Diagnosis not present

## 2021-05-28 DIAGNOSIS — I129 Hypertensive chronic kidney disease with stage 1 through stage 4 chronic kidney disease, or unspecified chronic kidney disease: Secondary | ICD-10-CM | POA: Diagnosis not present

## 2021-05-28 DIAGNOSIS — I10 Essential (primary) hypertension: Secondary | ICD-10-CM | POA: Diagnosis not present

## 2021-05-28 DIAGNOSIS — Z1211 Encounter for screening for malignant neoplasm of colon: Secondary | ICD-10-CM

## 2021-05-28 DIAGNOSIS — R809 Proteinuria, unspecified: Secondary | ICD-10-CM | POA: Diagnosis not present

## 2021-05-28 DIAGNOSIS — N2581 Secondary hyperparathyroidism of renal origin: Secondary | ICD-10-CM | POA: Diagnosis not present

## 2021-05-28 DIAGNOSIS — D631 Anemia in chronic kidney disease: Secondary | ICD-10-CM | POA: Diagnosis not present

## 2021-05-28 DIAGNOSIS — E1122 Type 2 diabetes mellitus with diabetic chronic kidney disease: Secondary | ICD-10-CM | POA: Diagnosis not present

## 2021-05-28 DIAGNOSIS — U071 COVID-19: Secondary | ICD-10-CM | POA: Diagnosis not present

## 2021-05-28 NOTE — Telephone Encounter (Signed)
LVM for pt to return my call.

## 2021-05-29 ENCOUNTER — Telehealth: Payer: Self-pay

## 2021-05-29 NOTE — Telephone Encounter (Signed)
CALLED PATIENT NO ANSWER LEFT VOICEMAIL FOR A CALL BACK °Letter sent °

## 2021-06-01 ENCOUNTER — Other Ambulatory Visit: Payer: Self-pay | Admitting: Internal Medicine

## 2021-06-01 DIAGNOSIS — E1122 Type 2 diabetes mellitus with diabetic chronic kidney disease: Secondary | ICD-10-CM | POA: Diagnosis not present

## 2021-06-01 DIAGNOSIS — E113393 Type 2 diabetes mellitus with moderate nonproliferative diabetic retinopathy without macular edema, bilateral: Secondary | ICD-10-CM | POA: Diagnosis not present

## 2021-06-01 DIAGNOSIS — E1159 Type 2 diabetes mellitus with other circulatory complications: Secondary | ICD-10-CM | POA: Diagnosis not present

## 2021-06-01 DIAGNOSIS — E1142 Type 2 diabetes mellitus with diabetic polyneuropathy: Secondary | ICD-10-CM | POA: Diagnosis not present

## 2021-06-01 DIAGNOSIS — M7989 Other specified soft tissue disorders: Secondary | ICD-10-CM | POA: Diagnosis not present

## 2021-06-01 DIAGNOSIS — R809 Proteinuria, unspecified: Secondary | ICD-10-CM | POA: Diagnosis not present

## 2021-06-01 DIAGNOSIS — E1129 Type 2 diabetes mellitus with other diabetic kidney complication: Secondary | ICD-10-CM | POA: Diagnosis not present

## 2021-06-01 DIAGNOSIS — R2241 Localized swelling, mass and lump, right lower limb: Secondary | ICD-10-CM

## 2021-06-01 DIAGNOSIS — E1121 Type 2 diabetes mellitus with diabetic nephropathy: Secondary | ICD-10-CM | POA: Diagnosis not present

## 2021-06-01 DIAGNOSIS — Z794 Long term (current) use of insulin: Secondary | ICD-10-CM | POA: Diagnosis not present

## 2021-06-01 DIAGNOSIS — N184 Chronic kidney disease, stage 4 (severe): Secondary | ICD-10-CM | POA: Diagnosis not present

## 2021-06-02 ENCOUNTER — Ambulatory Visit
Admission: RE | Admit: 2021-06-02 | Discharge: 2021-06-02 | Disposition: A | Discharge status: Home / Self Care | Payer: Medicare Other | Source: Ambulatory Visit | Attending: Internal Medicine | Admitting: Internal Medicine

## 2021-06-02 DIAGNOSIS — M7989 Other specified soft tissue disorders: Secondary | ICD-10-CM | POA: Diagnosis not present

## 2021-06-02 DIAGNOSIS — R2241 Localized swelling, mass and lump, right lower limb: Secondary | ICD-10-CM | POA: Insufficient documentation

## 2021-06-05 ENCOUNTER — Other Ambulatory Visit: Payer: Self-pay | Admitting: Family Medicine

## 2021-06-05 DIAGNOSIS — E1143 Type 2 diabetes mellitus with diabetic autonomic (poly)neuropathy: Secondary | ICD-10-CM

## 2021-06-05 DIAGNOSIS — I10 Essential (primary) hypertension: Secondary | ICD-10-CM

## 2021-06-19 ENCOUNTER — Other Ambulatory Visit: Payer: Self-pay | Admitting: Family Medicine

## 2021-06-19 DIAGNOSIS — R062 Wheezing: Secondary | ICD-10-CM

## 2021-06-19 NOTE — Telephone Encounter (Signed)
Requested Prescriptions  ?Pending Prescriptions Disp Refills  ?? albuterol (VENTOLIN HFA) 108 (90 Base) MCG/ACT inhaler [Pharmacy Med Name: ALBUTEROL HFA (PROAIR) INHALER] 8.5 each 2  ?  Sig: TAKE 2 PUFFS BY MOUTH EVERY 6 HOURS AS NEEDED FOR WHEEZE OR SHORTNESS OF BREATH  ?  ? Pulmonology:  Beta Agonists 2 Failed - 06/19/2021  2:31 AM  ?  ?  Failed - Last BP in normal range  ?  BP Readings from Last 1 Encounters:  ?04/15/21 (!) 147/84  ?   ?  ?  Passed - Last Heart Rate in normal range  ?  Pulse Readings from Last 1 Encounters:  ?04/15/21 80  ?   ?  ?  Passed - Valid encounter within last 12 months  ?  Recent Outpatient Visits   ?      ? 2 months ago Hypertension associated with diabetes ([place])  ? [facility], Dionne Bucy, MD  ? 4 months ago COVID-19  ? [facility] Gwyneth Sprout, FNP  ? 8 months ago Pain of left calf  ? [place], PA-C  ? 11 months ago Type 2 diabetes mellitus with hyperosmolar hyperglycemic state (HHS) ([place])  ? [facility], PA-C  ? 2 years ago Type 2 diabetes mellitus with diabetic autonomic neuropathy, with long-term current use of insulin ([place])  ? [place], PA-C  ?  ?  ?Future Appointments   ?        ? In 3 weeks Gwyneth Sprout, [place], P[facility]  ?  ? ?  ?  ?  ? ? ?

## 2021-06-22 ENCOUNTER — Other Ambulatory Visit (INDEPENDENT_AMBULATORY_CARE_PROVIDER_SITE_OTHER): Payer: Self-pay | Admitting: Nurse Practitioner

## 2021-06-22 DIAGNOSIS — M79606 Pain in leg, unspecified: Secondary | ICD-10-CM

## 2021-06-24 ENCOUNTER — Encounter (INDEPENDENT_AMBULATORY_CARE_PROVIDER_SITE_OTHER): Payer: Medicare Other

## 2021-06-24 ENCOUNTER — Ambulatory Visit (INDEPENDENT_AMBULATORY_CARE_PROVIDER_SITE_OTHER): Payer: Medicare Other | Admitting: Nurse Practitioner

## 2021-07-01 ENCOUNTER — Ambulatory Visit (INDEPENDENT_AMBULATORY_CARE_PROVIDER_SITE_OTHER): Payer: Medicare Other | Admitting: Nurse Practitioner

## 2021-07-01 ENCOUNTER — Encounter (INDEPENDENT_AMBULATORY_CARE_PROVIDER_SITE_OTHER): Payer: Self-pay | Admitting: Nurse Practitioner

## 2021-07-01 ENCOUNTER — Ambulatory Visit (INDEPENDENT_AMBULATORY_CARE_PROVIDER_SITE_OTHER): Payer: Medicare Other

## 2021-07-01 VITALS — BP 155/81 | HR 87 | Resp 17 | Ht 66.0 in | Wt 241.0 lb

## 2021-07-01 DIAGNOSIS — M79606 Pain in leg, unspecified: Secondary | ICD-10-CM

## 2021-07-01 DIAGNOSIS — E1129 Type 2 diabetes mellitus with other diabetic kidney complication: Secondary | ICD-10-CM | POA: Diagnosis not present

## 2021-07-01 MED ORDER — CEFDINIR 300 MG PO CAPS: 300.0000 mg | ORAL_CAPSULE | Freq: Two times a day (BID) | ORAL | 0 refills | Status: DC

## 2021-07-02 NOTE — Progress Notes (Signed)
Virtual Visit via Video Note  I connected with Jocelyn Wells on 07/09/21 at  1:20 PM EDT by a video enabled telemedicine application and verified that I am speaking with the correct person using two identifiers.  Location: Patient: home Provider: office Persons participated in the visit- patient, provider    I discussed the limitations of evaluation and management by telemedicine and the availability of in person appointments. The patient expressed understanding and agreed to proceed.   I discussed the assessment and treatment plan with the patient. The patient was provided an opportunity to ask questions and all were answered. The patient agreed with the plan and demonstrated an understanding of the instructions.   The patient was advised to call back or seek an in-person evaluation if the symptoms worsen or if the condition fails to improve as anticipated.  I provided 10 minutes of non-face-to-face time during this encounter.   Jocelyn Clay, MD    [facility] MD/PA/NP OP Progress Note  07/09/2021 1:41 PM Jocelyn Wells  MRN:  [id number]  Chief Complaint:  Chief Complaint  Patient presents with   Follow-up   Depression   HPI:  This is a follow-up appointment for depression.  She states that she fell today.  She occasionally feels dizziness in the context of low blood sugar (occasionally in 60's).  Her endocrinologist is aware of the condition.  She also reports struggling with her leg edema, which she is currently in the treatment.  She reports fair relationship with her husband.  She had a really good Mother's Day.  Her son visited her and cooked for dinner.  She also reports another son, who brought his children.  She feels happy that her stepdaughter will be having a baby.  Although she feels down from time to time, it has been manageable.  She has occasional insomnia.  She denies change in appetite.  She denies anxiety or panic attacks.  She denies SI.  She feels comfortable to stay  on the current medication regimen.   Daily routine: she does not have any routine. 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 She lost job as she was there "so long." She got disability for depression, followed by stroke.  Support: best friend, husband Household: husband Marital status: married for 24 years, married four times Number of children: 2 and 2 step children. She has 3 grandchildren (age 70, 46, 36) Her parents were separated, and she lived with her mother, brother. Both of her parents passed away  Visit Diagnosis:    ICD-10-CM   1. PTSD (post-traumatic stress disorder)  F43.10     2. MDD (major depressive disorder), recurrent, in partial remission (HCC)  F33.41 PARoxetine (PAXIL) 40 MG tablet      Past Psychiatric History: Please see initial evaluation for full details. I have reviewed the history. No updates at this time.     Past Medical History:  Past Medical History:  Diagnosis Date   Anxiety    Cardiomyopathy [facility])    new to her Jan 2017   Chronic kidney disease    COPD (chronic obstructive pulmonary disease) ([place])    Depression    Diabetes mellitus, type II ([place])    HTN (hypertension)    Obesity    Osteoporosis    PONV (postoperative nausea and vomiting)    Stroke [facility])    Jan 2017    Past Surgical History:  Procedure Laterality Date  ABDOMINAL HYSTERECTOMY     ANKLE FRACTURE SURGERY Left 2002   BILATERAL SALPINGOOPHORECTOMY  2000   CARDIAC CATHETERIZATION N/A 03/25/2015   Procedure: Right/Left Heart Cath and Coronary Angiography;  Surgeon: Belva Crome, MD;  Location: [place] CV LAB;  Service: Cardiovascular;  Laterality: N/A;   CESAREAN SECTION  1978   COLONOSCOPY WITH PROPOFOL N/A 09/22/2016   Procedure: COLONOSCOPY WITH PROPOFOL;  Surgeon: Manya Silvas, MD;  Location: [facility] ENDOSCOPY;  Service: Endoscopy;  Laterality: N/A;   EP IMPLANTABLE DEVICE N/A 08/05/2015    Procedure: Loop Recorder Insertion;  Surgeon: Deboraha Sprang, MD;  Location: [place] CV LAB;  Service: Cardiovascular;  Laterality: N/A;   ESOPHAGOGASTRODUODENOSCOPY (EGD) WITH PROPOFOL N/A 09/22/2016   Procedure: ESOPHAGOGASTRODUODENOSCOPY (EGD) WITH PROPOFOL;  Surgeon: Manya Silvas, MD;  Location: [facility] ENDOSCOPY;  Service: Endoscopy;  Laterality: N/A;   ESOPHAGOGASTRODUODENOSCOPY (EGD) WITH PROPOFOL N/A 12/08/2016   Procedure: ESOPHAGOGASTRODUODENOSCOPY (EGD) WITH PROPOFOL;  Surgeon: Manya Silvas, MD;  Location: [facility] ENDOSCOPY;  Service: Endoscopy;  Laterality: N/A;   KNEE ARTHROSCOPY Left 2005   TEE WITHOUT CARDIOVERSION N/A 01/31/2015   Procedure: TRANSESOPHAGEAL ECHOCARDIOGRAM (TEE);  Surgeon: Lelon Perla, MD;  Location: [facility] ENDOSCOPY;  Service: Cardiovascular;  Laterality: N/A;   [place]  2008    Family Psychiatric History: Please see initial evaluation for full details. I have reviewed the history. No updates at this time.     Family History:  Family History  Problem Relation Age of Onset   Heart disease Mother        died from CHF   Asthma Mother    Diabetes Mother    Heart disease Father    Aneurysm Father    COPD Brother    Diabetes Brother    Alcohol abuse Paternal Aunt    Anemia Neg Hx    Arrhythmia Neg Hx    Clotting disorder Neg Hx    Fainting Neg Hx    Heart attack Neg Hx    Heart failure Neg Hx    Hyperlipidemia Neg Hx    Hypertension Neg Hx     Social History:  Social History   Socioeconomic History   Marital status: Married    Spouse name: Not on file   Number of children: 2   Years of education: Not on file   Highest education level: Bachelor's degree (e.g., BA, AB, BS)  Occupational History   Occupation: disabled    Comment: retired  Tobacco Use   Smoking status: Some Days    Packs/day: 1.00    Years: 36.00    Pack years: 36.00    Types: Cigarettes    Start date: 02/08/1974    Smokeless tobacco: Never   Tobacco comments:    "I can not quit smoking, I have tried". 1 PPD  Vaping Use   Vaping Use: Never used  Substance and Sexual Activity   Alcohol use: No    Alcohol/week: 0.0 standard drinks   Drug use: No   Sexual activity: Yes    Birth control/protection: None  Other Topics Concern   Not on file  Social History Narrative   Lives at home with stepson and husband, dogs and cats   Caffeine  Drinks sweet tea.   Right handed.    Social Determinants of Health   Financial Resource Strain: Low Risk    Difficulty of Paying Living Expenses: Not hard at all  Food Insecurity: No Food Insecurity  Worried About Charity fundraiser in the Last Year: Never true   [place] in the Last Year: Never true  Transportation Needs: No Transportation Needs   Lack of Transportation (Medical): No   Lack of Transportation (Non-Medical): No  Physical Activity: Inactive   Days of Exercise per Week: 0 days   Minutes of Exercise per Session: 0 min  Stress: No Stress Concern Present   Feeling of Stress : Not at all  Social Connections: Moderately Isolated   Frequency of Communication with Friends and Family: More than three times a week   Frequency of Social Gatherings with Friends and Family: Once a week   Attends Religious Services: Never   Marine scientist or Organizations: No   Attends Archivist Meetings: Never   Marital Status: Married    Allergies:  Allergies  Allergen Reactions   Codeine Nausea And Vomiting    Metabolic Disorder Labs: Lab Results  Component Value Date   HGBA1C 10.2 (H) 05/31/2020   MPG 246 05/31/2020   MPG 303.44 10/06/2017   No results found for: PROLACTIN Lab Results  Component Value Date   CHOL 271 (H) 10/20/2020   TRIG 231 (H) 10/20/2020   HDL 41 10/20/2020   CHOLHDL 6.6 (H) 10/20/2020   VLDL 64 (H) 10/06/2017   LDLCALC 186 (H) 10/20/2020   LDLCALC 70 07/10/2020   No results found for: TSH  Therapeutic  Level Labs: No results found for: LITHIUM No results found for: VALPROATE No components found for:  CBMZ  Current Medications: Current Outpatient Medications  Medication Sig Dispense Refill   albuterol (VENTOLIN HFA) 108 (90 Base) MCG/ACT inhaler TAKE 2 PUFFS BY MOUTH EVERY 6 HOURS AS NEEDED FOR WHEEZE OR SHORTNESS OF BREATH 8.5 each 2   apixaban (ELIQUIS) 5 MG TABS tablet Take 1 tablet (5 mg total) by mouth 2 (two) times daily. 180 tablet 3   apixaban (ELIQUIS) 5 MG TABS tablet Take by mouth.     atorvastatin (LIPITOR) 40 MG tablet TAKE 1 TABLET BY MOUTH EVERY DAY 90 tablet 2   B-D INS SYRINGE 0.5CC/31GX5/16 31G X 5/16" 0.5 ML MISC      calcipotriene (DOVONOX) 0.005 % cream calcipotriene 0.005 % topical cream (Patient not taking: Reported on 07/01/2021)     carvedilol (COREG) 12.5 MG tablet carvedilol 12.5 mg tablet     cefdinir (OMNICEF) 300 MG capsule Take 1 capsule (300 mg total) by mouth 2 (two) times daily. 20 capsule 0   denosumab (PROLIA) 60 MG/ML SOSY injection Inject 60 mg into the skin every 6 (six) months.     diclofenac (VOLTAREN) 75 MG EC tablet diclofenac sodium 75 mg tablet,delayed release     Dulaglutide (TRULICITY) 1.5 GY/6.5LD SOPN Inject 1.5 mg into the skin every 7 (seven) days. (Monday Nights)     Dulaglutide 3 MG/0.5ML SOPN Inject into the skin.     Exenatide ER (BYDUREON BCISE) 2 MG/0.85ML AUIJ      fluticasone (FLONASE) 50 MCG/ACT nasal spray Place 2 sprays into both nostrils daily. 16 g 6   fluticasone (FLONASE) 50 MCG/ACT nasal spray Place into the nose.     gabapentin (NEURONTIN) 300 MG capsule Take 1 capsule (300 mg total) by mouth daily. (Patient taking differently: Take 300 mg by mouth 2 (two) times daily.) 90 capsule 3   glucose blood test strip OneTouch Verio strips     glyBURIDE-metformin (GLUCOVANCE) 5-500 MG per tablet Take 2 tablets by mouth 2 (two) times  daily.  (Patient not taking: Reported on 07/01/2021)     hydrochlorothiazide (HYDRODIURIL) 25 MG  tablet Take 25 mg by mouth daily.     influenza vac split quadrivalent PF (FLUARIX) 0.5 ML injection Fluzone Quad 2018-19(PF) 60 mcg(15 mcgx4)/0.5 mL intramuscular syringe (Patient not taking: Reported on 07/01/2021)     insulin aspart (NOVOLOG) 100 UNIT/ML injection Inject up to 100 units daily in insulin pump     Insulin Disposable Pump (OMNIPOD DASH PODS, GEN 4,) MISC USE 1 POD EACH EVERY 72    HOURS     Insulin Human (INSULIN PUMP) SOLN Per endocrinoloy     insulin NPH-regular Human (NOVOLIN 70/30) (70-30) 100 UNIT/ML injection Novolin 70/30 U-100 Insulin 100 unit/mL subcutaneous suspension  INJECT 20 UNITS BEFORE MORNING MEAL AND INJECT 60 UNITS BEFORE EVENING MEAL     loratadine (CLARITIN) 10 MG tablet Take 1 tablet by mouth daily.     losartan (COZAAR) 25 MG tablet TAKE 2 TABLETS BY MOUTH EVERY DAY 180 tablet 1   mupirocin ointment (BACTROBAN) 2 % mupirocin 2 % topical ointment (Patient not taking: Reported on 07/01/2021)     ondansetron (ZOFRAN) 4 MG tablet Take 1 tablet (4 mg total) by mouth every 8 (eight) hours as needed for nausea or vomiting. (Patient not taking: Reported on 05/27/2021) 20 tablet 0   [START ON 07/13/2021] PARoxetine (PAXIL) 40 MG tablet Take 1 tablet (40 mg total) by mouth daily. 90 tablet 0   Semaglutide, 1 MG/DOSE, (OZEMPIC, 1 MG/DOSE,) 2 MG/1.5ML SOPN Ozempic 1 mg/dose (2 mg/1.5 mL) subcutaneous pen injector (Patient not taking: Reported on 05/27/2021)     Spacer/Aero-Holding Chambers [facility] DIAMOND) MISC Use with inhaler to ensure medication is delivered throughout lungs 1 each 0   traMADol (ULTRAM) 50 MG tablet Take 1 tablet (50 mg total) by mouth 2 (two) times daily as needed. 60 tablet 2   TRULICITY 3 ZD/6.6YQ SOPN Inject into the skin.     Vitamin D, Ergocalciferol, (DRISDOL) 1.25 MG (50000 UT) CAPS capsule Take 50,000 Units by mouth once a week. (Sundays)     No current facility-administered medications for this visit.     Musculoskeletal: Strength &  Muscle Tone:  N/A Gait & Station:  N/A Patient leans: N/A  Psychiatric Specialty Exam: Review of Systems  Psychiatric/Behavioral:  Positive for dysphoric mood and sleep disturbance. Negative for agitation, behavioral problems, confusion, decreased concentration, hallucinations, self-injury and suicidal ideas. The patient is not nervous/anxious and is not hyperactive.   All other systems reviewed and are negative.  There were no vitals taken for this visit.There is no height or weight on file to calculate BMI.  General Appearance: Fairly Groomed  Eye Contact:  Good  Speech:  Clear and Coherent  Volume:  Normal  Mood:   good  Affect:  Appropriate, Congruent, and calm  Thought Process:  Coherent  Orientation:  Full (Time, Place, and Person)  Thought Content: Logical   Suicidal Thoughts:  No  Homicidal Thoughts:  No  Memory:  Immediate;   Good  Judgement:  Good  Insight:  Good  Psychomotor Activity:  Normal  Concentration:  Concentration: Good and Attention Span: Good  Recall:  Good  Fund of Knowledge: Good  Language: Good  Akathisia:  No  Handed:  Right  AIMS (if indicated): not done  Assets:  Communication Skills Desire for Improvement  ADL's:  Intact  Cognition: WNL  Sleep:  Fair   Screenings: PHQ2-9    Flowsheet Row Clinical Support from 05/27/2021  in [facility] Most recent reading at 05/27/2021  2:03 PM Office Visit from 04/15/2021 in [place] Most recent reading at 04/15/2021  1:59 PM Office Visit from 04/13/2021 in [facility] Most recent reading at 04/13/2021 10:37 AM Office Visit from 04/13/2021 in [place] Most recent reading at 04/13/2021  8:38 AM Office Visit from 10/02/2020 in [place] Most recent reading at 10/02/2020  9:33 AM  PHQ-2 Total Score 0 0 2 2 0  PHQ-9 Total Score 3 -- 6 6 --      [place] Office Visit  from 04/13/2021 in [facility] to Hosp-Admission (Discharged) from 05/31/2020 in [place] No Risk No Risk        Assessment and Plan:  TECKLA CHRISTIANSEN is a 66 y.o. year old female with a history of depression, type II DM, hypertension, hyperlipidemia, non ischemic cardiomyopathy,  CKD stage IV, cryptogenic stroke, COPD, sleep apnea, back pain with T12-L1 compression fracture, T7-8 disc herniation with some spinal cord compression with no clinical myelopathy, who presents for follow up appointment for below.   1. MDD (major depressive disorder), recurrent, in partial remission ([place]) 2. PTSD (post-traumatic stress disorder) She denies significant mood symptoms except occasional down mood since the last visit.  Psychosocial stressors includes unemployment, medical history of chronic pain and stroke.  Will continue Paxil as maintenance treatment for depression. Noted that she has been on this medication for many years without significant side effect.     # Insomnia Unchanged. She reports history of snoring, daytime fatigue and insomnia.  Although she was strongly recommended to do a sleep evaluation, she declined this, referring to the evaluation she had few years ago.  Will continue to monitor.     Plan Continue Paxil 40 mg daily Next appointment- 8/30 at 1 PM for 20 mins, video  -on tramadol. Will monitor for serotonin syndrome   The patient demonstrates the following risk factors for suicide: Chronic risk factors for suicide include: psychiatric disorder of depression and chronic pain. Acute risk factors for suicide include: unemployment. Protective factors for this patient include: positive social support, coping skills, and hope for the future. Considering these factors, the overall suicide risk at this point appears to be low. Patient is appropriate for outpatient follow up.        Collaboration of Care: Collaboration of Care: Other N/A  Patient/Guardian was advised Release of Information must be obtained prior to any record release in order to collaborate their care with an outside provider. Patient/Guardian was advised if they have not already done so to contact the registration department to sign all necessary forms in order for [place] to release information regarding their care.   Consent: Patient/Guardian gives verbal consent for treatment and assignment of benefits for services provided during this visit. Patient/Guardian expressed understanding and agreed to proceed.    Jocelyn Clay, MD 07/09/2021, 1:41 PM

## 2021-07-07 ENCOUNTER — Encounter (INDEPENDENT_AMBULATORY_CARE_PROVIDER_SITE_OTHER): Payer: Self-pay | Admitting: Nurse Practitioner

## 2021-07-07 NOTE — Progress Notes (Signed)
Subjective:    Patient ID: Jocelyn Wells, female    DOB: Jul 16, 1955, 66 y.o.   MRN: [id number] No chief complaint on file.   Jocelyn Wells is a 66 year old female who presents today for evaluation of pain in her lower extremities as well as swelling.  Patient had a fall several weeks ago and has been noticing increasing swelling and discomfort.  Currently there is no open wounds or ulcerations.  There are no blisters.  Patient notes that since the swelling began her leg has become much more painful.  There was concern that the patient had a DVT as she has had a previous history of DVT and none was detected by ultrasound on 06/02/2021.  Today noninvasive studies show an ABI of 1.23 on the right and 0.76 on the left.  The patient has biphasic/triphasic waveforms on the right with biphasic waveforms on the left normal toe waveforms noted bilaterally   Review of Systems  Cardiovascular:  Positive for leg swelling.  All other systems reviewed and are negative.     Objective:   Physical Exam Vitals reviewed.  HENT:     Head: Normocephalic.  Cardiovascular:     Rate and Rhythm: Normal rate.     Pulses:          Dorsalis pedis pulses are detected w/ Doppler on the right side and detected w/ Doppler on the left side.       Posterior tibial pulses are detected w/ Doppler on the right side and detected w/ Doppler on the left side.  Pulmonary:     Effort: Pulmonary effort is normal.  Skin:    General: Skin is warm and dry.  Neurological:     Mental Status: She is alert and oriented to person, place, and time.  Psychiatric:        Mood and Affect: Mood normal.        Behavior: Behavior normal.        Thought Content: Thought content normal.        Judgment: Judgment normal.    BP (!) 155/81 (BP Location: Right Arm)   Pulse 87   Resp 17   Ht '5\' 6"'$  (1.676 m)   Wt 241 lb (109.3 kg)   BMI 38.90 kg/m   Past Medical History:  Diagnosis Date   Anxiety    Cardiomyopathy [facility])    new  to her Jan 2017   Chronic kidney disease    COPD (chronic obstructive pulmonary disease) ([place])    Depression    Diabetes mellitus, type II ([place])    HTN (hypertension)    Obesity    Osteoporosis    PONV (postoperative nausea and vomiting)    Stroke [facility])    Jan 2017    Social History   Socioeconomic History   Marital status: Married    Spouse name: Not on file   Number of children: 2   Years of education: Not on file   Highest education level: Bachelor's degree (e.g., BA, AB, BS)  Occupational History   Occupation: disabled    Comment: retired  Tobacco Use   Smoking status: Some Days    Packs/day: 1.00    Years: 36.00    Pack years: 36.00    Types: Cigarettes    Start date: 02/08/1974   Smokeless tobacco: Never   Tobacco comments:    "I can not quit smoking, I have tried". 1 PPD  Vaping Use   Vaping Use: Never used  Substance  and Sexual Activity   Alcohol use: No    Alcohol/week: 0.0 standard drinks   Drug use: No   Sexual activity: Yes    Birth control/protection: None  Other Topics Concern   Not on file  Social History Narrative   Lives at home with stepson and husband, dogs and cats   Caffeine  Drinks sweet tea.   Right handed.    Social Determinants of Health   Financial Resource Strain: Low Risk    Difficulty of Paying Living Expenses: Not hard at all  Food Insecurity: No Food Insecurity   Worried About Charity fundraiser in the Last Year: Never true   [place] in the Last Year: Never true  Transportation Needs: No Transportation Needs   Lack of Transportation (Medical): No   Lack of Transportation (Non-Medical): No  Physical Activity: Inactive   Days of Exercise per Week: 0 days   Minutes of Exercise per Session: 0 min  Stress: No Stress Concern Present   Feeling of Stress : Not at all  Social Connections: Moderately Isolated   Frequency of Communication with Friends and Family: More than three times a week   Frequency of Social Gatherings  with Friends and Family: Once a week   Attends Religious Services: Never   Marine scientist or Organizations: No   Attends Archivist Meetings: Never   Marital Status: Married  Human resources officer Violence: Not At Risk   Fear of Current or Ex-Partner: No   Emotionally Abused: No   Physically Abused: No   Sexually Abused: No    Past Surgical History:  Procedure Laterality Date   ABDOMINAL HYSTERECTOMY     ANKLE FRACTURE SURGERY Left 2002   BILATERAL SALPINGOOPHORECTOMY  2000   CARDIAC CATHETERIZATION N/A 03/25/2015   Procedure: Right/Left Heart Cath and Coronary Angiography;  Surgeon: Belva Crome, MD;  Location: [facility];  Service: Cardiovascular;  Laterality: N/A;   CESAREAN SECTION  1978   COLONOSCOPY WITH PROPOFOL N/A 09/22/2016   Procedure: COLONOSCOPY WITH PROPOFOL;  Surgeon: Manya Silvas, MD;  Location: [facility] ENDOSCOPY;  Service: Endoscopy;  Laterality: N/A;   EP IMPLANTABLE DEVICE N/A 08/05/2015   Procedure: Loop Recorder Insertion;  Surgeon: Deboraha Sprang, MD;  Location: [place] [facility];  Service: Cardiovascular;  Laterality: N/A;   ESOPHAGOGASTRODUODENOSCOPY (EGD) WITH PROPOFOL N/A 09/22/2016   Procedure: ESOPHAGOGASTRODUODENOSCOPY (EGD) WITH PROPOFOL;  Surgeon: Manya Silvas, MD;  Location: [facility] ENDOSCOPY;  Service: Endoscopy;  Laterality: N/A;   ESOPHAGOGASTRODUODENOSCOPY (EGD) WITH PROPOFOL N/A 12/08/2016   Procedure: ESOPHAGOGASTRODUODENOSCOPY (EGD) WITH PROPOFOL;  Surgeon: Manya Silvas, MD;  Location: [facility] ENDOSCOPY;  Service: Endoscopy;  Laterality: N/A;   KNEE ARTHROSCOPY Left 2005   TEE WITHOUT CARDIOVERSION N/A 01/31/2015   Procedure: TRANSESOPHAGEAL ECHOCARDIOGRAM (TEE);  Surgeon: Lelon Perla, MD;  Location: [facility] ENDOSCOPY;  Service: Cardiovascular;  Laterality: N/A;   [place]  2008    Family History  Problem Relation Age of Onset   Heart disease Mother        died from CHF    Asthma Mother    Diabetes Mother    Heart disease Father    Aneurysm Father    COPD Brother    Diabetes Brother    Alcohol abuse Paternal Aunt    Anemia Neg Hx    Arrhythmia Neg Hx    Clotting disorder Neg Hx    Fainting Neg  Hx    Heart attack Neg Hx    Heart failure Neg Hx    Hyperlipidemia Neg Hx    Hypertension Neg Hx     Allergies  Allergen Reactions   Codeine Nausea And Vomiting       Latest Ref Rng & Units 10/20/2020    3:54 PM 07/10/2020    3:56 PM 06/01/2020    4:40 AM  CBC  WBC 3.4 - 10.8 x10E3/uL 13.2   13.0   13.6    Hemoglobin 11.1 - 15.9 g/dL 12.8   13.3   12.7    Hematocrit 34.0 - 46.6 % 41.2   42.7   38.9    Platelets 150 - 450 x10E3/uL 415   435   384        CMP     Component Value Date/Time   NA 141 10/20/2020 1554   K 4.6 10/20/2020 1554   CL 103 10/20/2020 1554   CO2 23 10/20/2020 1554   GLUCOSE 205 (H) 10/20/2020 1554   GLUCOSE 141 (H) 06/01/2020 0440   BUN 37 (H) 10/20/2020 1554   CREATININE 2.62 (H) 10/20/2020 1554   CREATININE 1.37 (H) 03/21/2015 1451   CALCIUM 8.6 (L) 10/20/2020 1554   PROT 6.3 10/20/2020 1554   ALBUMIN 3.6 (L) 10/20/2020 1554   AST 8 10/20/2020 1554   ALT 9 10/20/2020 1554   ALKPHOS 98 10/20/2020 1554   BILITOT <0.2 10/20/2020 1554   GFRNONAA 31 (L) 06/01/2020 0440   GFRAA 33 (L) 04/23/2019 1446     VAS [place] ABI WITH/WO TBI  Result Date: 07/02/2021  LOWER EXTREMITY DOPPLER STUDY Patient Name:  Jocelyn Wells  Date of Exam:   07/01/2021 Medical Rec #: [id number]       Accession #:    [id number] Date of Birth: 1955-04-13       Patient Gender: F Patient Age:   64 years Exam Location:  [facility] Procedure:      VAS [place] ABI WITH/WO TBI Referring Phys: [name] --------------------------------------------------------------------------------  Indications: Rest pain.  Performing Technologist: Almira Coaster RVS  Examination Guidelines: A complete evaluation includes at minimum, Doppler waveform signals and  systolic blood pressure reading at the level of bilateral brachial, anterior tibial, and posterior tibial arteries, when vessel segments are accessible. Bilateral testing is considered an integral part of a complete examination. Photoelectric Plethysmograph (PPG) waveforms and toe systolic pressure readings are included as required and additional duplex testing as needed. Limited examinations for reoccurring indications may be performed as noted.  ABI Findings: +---------+------------------+-----+---------+--------+ Right    Rt Pressure (mmHg)IndexWaveform Comment  +---------+------------------+-----+---------+--------+ Brachial 179                                      +---------+------------------+-----+---------+--------+ ATA      164               0.92 biphasic          +---------+------------------+-----+---------+--------+ PTA      220               1.23 triphasic         +---------+------------------+-----+---------+--------+ Great Toe160               0.89 Normal            +---------+------------------+-----+---------+--------+ +---------+------------------+-----+--------+-------+ Left     Lt Pressure (mmHg)IndexWaveformComment +---------+------------------+-----+--------+-------+ Brachial 174                                    +---------+------------------+-----+--------+-------+  ATA      136               0.76 biphasic        +---------+------------------+-----+--------+-------+ PTA      121               0.68 biphasic        +---------+------------------+-----+--------+-------+ Great Toe107               0.60 Abnormal        +---------+------------------+-----+--------+-------+ +-------+-----------+-----------+------------+------------+ ABI/TBIToday's ABIToday's TBIPrevious ABIPrevious TBI +-------+-----------+-----------+------------+------------+ Right  1.23       .89                                  +-------+-----------+-----------+------------+------------+ Left   .76        .60                                 +-------+-----------+-----------+------------+------------+  Summary: Right: Resting right ankle-brachial index is within normal range. No evidence of significant right lower extremity arterial disease. The right toe-brachial index is normal. Left: Resting left ankle-brachial index indicates moderate left lower extremity arterial disease. The left toe-brachial index is abnormal.  *See table(s) above for measurements and observations.  Electronically signed by [name] MD on 07/02/2021 at 7:12:17 PM.    Final        Assessment & Plan:   1. Pain of lower extremity, unspecified laterality Noninvasive studies today did not reveal significant arterial disease but the patient does have continued issues with swelling.  Previous DVT studies were negative.  We will have the patient placed in Unna boots to decrease the swelling to see if this greatly decreases the pain as well as the swelling.  It is also possible the pain is related to her fall that happened several weeks ago.  We will have the patient return with reflux studies at her follow-up evaluation.  2. Type 2 diabetes mellitus with other diabetic kidney complication (HCC) Continue hypoglycemic medications as already ordered, these medications have been reviewed and there are no changes at this time.  Hgb A1C to be monitored as already arranged by primary service    Current Outpatient Medications on File Prior to Visit  Medication Sig Dispense Refill   albuterol (VENTOLIN HFA) 108 (90 Base) MCG/ACT inhaler TAKE 2 PUFFS BY MOUTH EVERY 6 HOURS AS NEEDED FOR WHEEZE OR SHORTNESS OF BREATH 8.5 each 2   apixaban (ELIQUIS) 5 MG TABS tablet Take 1 tablet (5 mg total) by mouth 2 (two) times daily. 180 tablet 3   apixaban (ELIQUIS) 5 MG TABS tablet Take by mouth.     atorvastatin (LIPITOR) 40 MG tablet TAKE 1 TABLET BY MOUTH EVERY  DAY 90 tablet 2   B-D INS SYRINGE 0.5CC/31GX5/16 31G X 5/16" 0.5 ML MISC      carvedilol (COREG) 12.5 MG tablet carvedilol 12.5 mg tablet     denosumab (PROLIA) 60 MG/ML SOSY injection Inject 60 mg into the skin every 6 (six) months.     diclofenac (VOLTAREN) 75 MG EC tablet diclofenac sodium 75 mg tablet,delayed release     Dulaglutide (TRULICITY) 1.5 BW/4.6KZ SOPN Inject 1.5 mg into the skin every 7 (seven) days. (Monday Nights)     Dulaglutide 3 MG/0.5ML SOPN Inject into the skin.     Exenatide ER (BYDUREON BCISE) 2 MG/0.85ML  AUIJ      fluticasone (FLONASE) 50 MCG/ACT nasal spray Place 2 sprays into both nostrils daily. 16 g 6   fluticasone (FLONASE) 50 MCG/ACT nasal spray Place into the nose.     glucose blood test strip OneTouch Verio strips     hydrochlorothiazide (HYDRODIURIL) 25 MG tablet Take 25 mg by mouth daily.     insulin aspart (NOVOLOG) 100 UNIT/ML injection Inject up to 100 units daily in insulin pump     Insulin Disposable Pump (OMNIPOD DASH PODS, GEN 4,) MISC USE 1 POD EACH EVERY 72    HOURS     Insulin Human (INSULIN PUMP) SOLN Per endocrinoloy     insulin NPH-regular Human (NOVOLIN 70/30) (70-30) 100 UNIT/ML injection Novolin 70/30 U-100 Insulin 100 unit/mL subcutaneous suspension  INJECT 20 UNITS BEFORE MORNING MEAL AND INJECT 60 UNITS BEFORE EVENING MEAL     loratadine (CLARITIN) 10 MG tablet Take 1 tablet by mouth daily.     losartan (COZAAR) 25 MG tablet TAKE 2 TABLETS BY MOUTH EVERY DAY 180 tablet 1   PARoxetine (PAXIL) 40 MG tablet Take 1 tablet (40 mg total) by mouth daily. 90 tablet 0   Spacer/Aero-Holding Chambers (OPTICHAMBER DIAMOND) MISC Use with inhaler to ensure medication is delivered throughout lungs 1 each 0   traMADol (ULTRAM) 50 MG tablet Take 1 tablet (50 mg total) by mouth 2 (two) times daily as needed. 60 tablet 2   TRULICITY 3 WN/0.2VO SOPN Inject into the skin.     Vitamin D, Ergocalciferol, (DRISDOL) 1.25 MG (50000 UT) CAPS capsule Take 50,000 Units  by mouth once a week. (Sundays)     calcipotriene (DOVONOX) 0.005 % cream calcipotriene 0.005 % topical cream (Patient not taking: Reported on 07/01/2021)     gabapentin (NEURONTIN) 300 MG capsule Take 1 capsule (300 mg total) by mouth daily. (Patient taking differently: Take 300 mg by mouth 2 (two) times daily.) 90 capsule 3   glyBURIDE-metformin (GLUCOVANCE) 5-500 MG per tablet Take 2 tablets by mouth 2 (two) times daily.  (Patient not taking: Reported on 07/01/2021)     influenza vac split quadrivalent PF (FLUARIX) 0.5 ML injection Fluzone Quad 2018-19(PF) 60 mcg(15 mcgx4)/0.5 mL intramuscular syringe (Patient not taking: Reported on 07/01/2021)     mupirocin ointment (BACTROBAN) 2 % mupirocin 2 % topical ointment (Patient not taking: Reported on 07/01/2021)     ondansetron (ZOFRAN) 4 MG tablet Take 1 tablet (4 mg total) by mouth every 8 (eight) hours as needed for nausea or vomiting. (Patient not taking: Reported on 05/27/2021) 20 tablet 0   Semaglutide, 1 MG/DOSE, (OZEMPIC, 1 MG/DOSE,) 2 MG/1.5ML SOPN Ozempic 1 mg/dose (2 mg/1.5 mL) subcutaneous pen injector (Patient not taking: Reported on 05/27/2021)     No current facility-administered medications on file prior to visit.    There are no Patient Instructions on file for this visit. No follow-ups on file.   Kris Hartmann, NP

## 2021-07-08 ENCOUNTER — Ambulatory Visit (INDEPENDENT_AMBULATORY_CARE_PROVIDER_SITE_OTHER): Payer: Medicare Other | Admitting: Nurse Practitioner

## 2021-07-08 ENCOUNTER — Encounter (INDEPENDENT_AMBULATORY_CARE_PROVIDER_SITE_OTHER): Payer: Self-pay

## 2021-07-08 VITALS — BP 162/93 | HR 88 | Resp 17 | Ht 66.0 in | Wt 247.0 lb

## 2021-07-08 DIAGNOSIS — M7989 Other specified soft tissue disorders: Secondary | ICD-10-CM

## 2021-07-08 NOTE — Progress Notes (Signed)
History of Present Illness  There is no documented history at this time  Assessments & Plan   There are no diagnoses linked to this encounter.    Additional instructions  Subjective:  Patient presents with venous ulcer of the Bilateral lower extremity.    Procedure:  3 layer unna wrap was placed Bilateral lower extremity.   Plan:   Follow up in one week.  

## 2021-07-09 ENCOUNTER — Encounter: Payer: Self-pay | Admitting: Psychiatry

## 2021-07-09 ENCOUNTER — Telehealth (INDEPENDENT_AMBULATORY_CARE_PROVIDER_SITE_OTHER): Payer: Medicare Other | Admitting: Psychiatry

## 2021-07-09 DIAGNOSIS — F4310 Post-traumatic stress disorder, unspecified: Secondary | ICD-10-CM

## 2021-07-09 DIAGNOSIS — F3341 Major depressive disorder, recurrent, in partial remission: Secondary | ICD-10-CM | POA: Diagnosis not present

## 2021-07-09 MED ORDER — PAROXETINE HCL 40 MG PO TABS: 40.0000 mg | ORAL_TABLET | Freq: Every day | ORAL | 0 refills | Status: DC

## 2021-07-10 NOTE — Progress Notes (Signed)
Established patient visit  I,Jocelyn Wells,acting as a scribe for Gwyneth Sprout, FNP.,have documented all relevant documentation on the behalf of Gwyneth Sprout, FNP,as directed by  Gwyneth Sprout, FNP while in the presence of Gwyneth Sprout, FNP.   Patient: Jocelyn Wells   DOB: 1955-02-11   66 y.o. Female  MRN: [id number] Visit Date: 07/14/2021  Today's healthcare provider: Gwyneth Sprout, FNP  Patient presents for new patient visit to establish care.  Introduced to Designer, jewellery role and practice setting.  All questions answered.  Discussed provider/patient relationship and expectations.   Chief Complaint  Patient presents with   Follow-up   Subjective    HPI Had AWV done 05/27/2021 Diabetes Mellitus Type II, follow-up  Lab Results  Component Value Date   HGBA1C 7.9 (A) 07/14/2021   HGBA1C 10.3 09/02/2020   HGBA1C 10.2 (H) 05/31/2020   Last seen for diabetes 3 months ago. She was seen by Endocrine 06/25/2021. Reports no a1C was done. Last completed 11 months ago.  Hypertension, follow-up  BP Readings from Last 3 Encounters:  07/14/21 132/73  07/08/21 (!) 162/93  07/01/21 (!) 155/81   Wt Readings from Last 3 Encounters:  07/14/21 241 lb 4.8 oz (109.5 kg)  07/08/21 247 lb (112 kg)  07/01/21 241 lb (109.3 kg)     She was last seen for hypertension 3 months ago.  BP at that visit was 162/93. Management since that visit includes continue medications. She reports excellent compliance with treatment. She is not having side effects.  She is not exercising. She is adherent to low salt diet.   Outside blood pressures are not being checked.  ---------------------------------------------------------------------------------------------------   Medications: Outpatient Medications Prior to Visit  Medication Sig   albuterol (VENTOLIN HFA) 108 (90 Base) MCG/ACT inhaler TAKE 2 PUFFS BY MOUTH EVERY 6 HOURS AS NEEDED FOR WHEEZE OR SHORTNESS OF BREATH   apixaban  (ELIQUIS) 5 MG TABS tablet Take 1 tablet (5 mg total) by mouth 2 (two) times daily.   apixaban (ELIQUIS) 5 MG TABS tablet Take by mouth.   atorvastatin (LIPITOR) 40 MG tablet TAKE 1 TABLET BY MOUTH EVERY DAY   B-D INS SYRINGE 0.5CC/31GX5/16 31G X 5/16" 0.5 ML MISC    carvedilol (COREG) 12.5 MG tablet carvedilol 12.5 mg tablet   cefdinir (OMNICEF) 300 MG capsule Take 1 capsule (300 mg total) by mouth 2 (two) times daily.   denosumab (PROLIA) 60 MG/ML SOSY injection Inject 60 mg into the skin every 6 (six) months.   diclofenac (VOLTAREN) 75 MG EC tablet diclofenac sodium 75 mg tablet,delayed release   Dulaglutide (TRULICITY) 1.5 RC/7.8LF SOPN Inject 1.5 mg into the skin every 7 (seven) days. (Monday Nights)   Dulaglutide 3 MG/0.5ML SOPN Inject into the skin.   fluticasone (FLONASE) 50 MCG/ACT nasal spray Place 2 sprays into both nostrils daily.   fluticasone (FLONASE) 50 MCG/ACT nasal spray Place into the nose.   glucose blood test strip OneTouch Verio strips   hydrochlorothiazide (HYDRODIURIL) 25 MG tablet Take 25 mg by mouth daily.   influenza vac split quadrivalent PF (FLUARIX) 0.5 ML injection    insulin aspart (NOVOLOG) 100 UNIT/ML injection Inject up to 100 units daily in insulin pump   Insulin Disposable Pump (OMNIPOD DASH PODS, GEN 4,) MISC USE 1 POD EACH EVERY 72    HOURS   Insulin Human (INSULIN PUMP) SOLN Per endocrinoloy   insulin NPH-regular Human (NOVOLIN 70/30) (70-30) 100 UNIT/ML injection Novolin 70/30  U-100 Insulin 100 unit/mL subcutaneous suspension  INJECT 20 UNITS BEFORE MORNING MEAL AND INJECT 60 UNITS BEFORE EVENING MEAL   loratadine (CLARITIN) 10 MG tablet Take 1 tablet by mouth daily.   losartan (COZAAR) 25 MG tablet TAKE 2 TABLETS BY MOUTH EVERY DAY   ondansetron (ZOFRAN) 4 MG tablet Take 1 tablet (4 mg total) by mouth every 8 (eight) hours as needed for nausea or vomiting.   PARoxetine (PAXIL) 40 MG tablet Take 1 tablet (40 mg total) by mouth daily.    Spacer/Aero-Holding Chambers [facility] DIAMOND) MISC Use with inhaler to ensure medication is delivered throughout lungs   traMADol (ULTRAM) 50 MG tablet Take 1 tablet (50 mg total) by mouth 2 (two) times daily as needed.   TRULICITY 3 HU/3.1SH SOPN Inject into the skin.   Vitamin D, Ergocalciferol, (DRISDOL) 1.25 MG (50000 UT) CAPS capsule Take 50,000 Units by mouth once a week. (Sundays)   gabapentin (NEURONTIN) 300 MG capsule Take 1 capsule (300 mg total) by mouth daily. (Patient taking differently: Take 300 mg by mouth 2 (two) times daily.)   [DISCONTINUED] calcipotriene (DOVONOX) 0.005 % cream    [DISCONTINUED] Exenatide ER (BYDUREON BCISE) 2 MG/0.85ML AUIJ    [DISCONTINUED] glyBURIDE-metformin (GLUCOVANCE) 5-500 MG per tablet Take 2 tablets by mouth 2 (two) times daily.   [DISCONTINUED] mupirocin ointment (BACTROBAN) 2 %    [DISCONTINUED] Semaglutide, 1 MG/DOSE, (OZEMPIC, 1 MG/DOSE,) 2 MG/1.5ML SOPN    No facility-administered medications prior to visit.    Review of Systems  Constitutional:  Positive for fatigue.  Cardiovascular:  Positive for leg swelling. Negative for chest pain and palpitations.      Objective    BP 132/73 (BP Location: Left Arm, Patient Position: Sitting, Cuff Size: Large)   Pulse 82   Resp 16   Wt 241 lb 4.8 oz (109.5 kg)   BMI 38.95 kg/m    Physical Exam Vitals and nursing note reviewed.  Constitutional:      General: She is not in acute distress.    Appearance: Normal appearance. She is obese. She is not ill-appearing, toxic-appearing or diaphoretic.  HENT:     Head: Normocephalic and atraumatic.  Cardiovascular:     Rate and Rhythm: Normal rate and regular rhythm.     Pulses: Normal pulses.     Heart sounds: Murmur heard.    No friction rub. No gallop.  Pulmonary:     Effort: Pulmonary effort is normal. No respiratory distress.     Breath sounds: Decreased air movement present. No stridor. Examination of the right-lower field reveals  decreased breath sounds. Examination of the left-lower field reveals decreased breath sounds. Decreased breath sounds present. No wheezing, rhonchi or rales.  Chest:     Chest wall: No tenderness.  Abdominal:     General: Bowel sounds are normal.     Palpations: Abdomen is soft.  Musculoskeletal:        General: No swelling, tenderness, deformity or signs of injury. Normal range of motion.     Right lower leg: No edema.     Left lower leg: No edema.  Skin:    General: Skin is warm and dry.     Capillary Refill: Capillary refill takes less than 2 seconds.     Coloration: Skin is not jaundiced or pale.     Findings: Lesion and rash present. No bruising or erythema.       Neurological:     General: No focal deficit present.  Mental Status: She is alert and oriented to person, place, and time. Mental status is at baseline.     Cranial Nerves: No cranial nerve deficit.     Sensory: No sensory deficit.     Motor: No weakness.     Coordination: Coordination normal.  Psychiatric:        Mood and Affect: Mood normal.        Behavior: Behavior normal.        Thought Content: Thought content normal.        Judgment: Judgment normal.      Results for orders placed or performed in visit on 07/14/21  Hemoglobin A1c  Result Value Ref Range   Hemoglobin A1C 10.3   POCT glycosylated hemoglobin (Hb A1C)  Result Value Ref Range   Hemoglobin A1C 7.9 (A) 4.0 - 5.6 %   Est. average glucose Bld gHb Est-mCnc 180     Assessment & Plan     Problem List Items Addressed This Visit       Cardiovascular and Mediastinum   Heart failure, unspecified ([place])    Chronic, likely exacerbated give acute  Lower edema worsened, R > L SOB/DOE with minimal effort, ex lifting leg for shoe removal and unable to complete 2 sentences. Will refer to reestablish routine care       Hypertension associated with diabetes ([place])    Chronic, stable. Goal <130/<80. Denies CP Denies SOB/ DOE Denies low blood  pressure/hypotension Denies vision changes No LE Edema noted on exam Continue medication, Coreg 12.5 mg QD, HCTZ, 25 mg daily, Losartan 50 mg daily,  RTC 3 months Seek emergent care if you develop chest pain or chest pressure         Respiratory   COPD (chronic obstructive pulmonary disease) (HCC)    Chronic, stable Recommend referral to pulmonary for PFTs         Endocrine   Type 2 diabetes mellitus with other diabetic kidney complication (HCC)    Chronic, improved With insulin pump Some lows Continue to recommend balanced, lower carb meals. Smaller meal size, adding snacks. Choosing water as drink of choice and increasing purposeful exercise. A1c now <8% However, CKD stage IV Is followed by endocrine and nephrology Needs to have a solid family idea of what is going on given some concern of lack of understanding from hx of CVA       Relevant Orders   POCT glycosylated hemoglobin (Hb A1C) (Completed)     Genitourinary   CKD (chronic kidney disease), stage IV (HCC)    Chronic, worsening Medications adjusted A1c has improved Continue to monitor fluid intake Monitor phosphorus and potassium in diet          Other   Bilateral leg edema    Chronic, variable Dressings removed from bilateral legs Unna boots with calamine  Fluid blister over L dressing; intact Encouraged not to pop       Relevant Orders   Ambulatory referral to Cardiology   DOE (dyspnea on exertion)    Acute on chronic presentation Would benefit from additional nebs for presumed COPD       Relevant Orders   Ambulatory referral to Cardiology   Ambulatory referral to Pulmonology   Fall - Primary    Recent fall 6 days ago, landing on L knee and R ankle Will send for imaging R lateral ankle sore L knee swollen        Relevant Orders   DG Foot Complete Right  DG Ankle Complete Right   DG Knee Complete 4 Views Left   Positive depression screening    Chronic, exacerbated Has psych  team Is on paxil at 40 mg Denies need for changes at this time         Return in about 3 months (around 10/14/2021) for chonic disease management.      Vonna Kotyk, FNP, have reviewed all documentation for this visit. The documentation on 07/14/21 for the exam, diagnosis, procedures, and orders are all accurate and complete.  I spent 50 minutes discussing, reviewing, and answering questions for Jocelyn Wells and her spouse. Billing reflects this time.   Gwyneth Sprout, [place] [phone] (phone) [phone] (fax)  [place]

## 2021-07-13 ENCOUNTER — Encounter (INDEPENDENT_AMBULATORY_CARE_PROVIDER_SITE_OTHER): Payer: Self-pay | Admitting: Nurse Practitioner

## 2021-07-14 ENCOUNTER — Ambulatory Visit
Admission: RE | Admit: 2021-07-14 | Discharge: 2021-07-14 | Disposition: A | Discharge status: Home / Self Care | Payer: Medicare Other | Attending: Family Medicine | Admitting: Family Medicine

## 2021-07-14 ENCOUNTER — Encounter: Payer: Self-pay | Admitting: Family Medicine

## 2021-07-14 ENCOUNTER — Ambulatory Visit (INDEPENDENT_AMBULATORY_CARE_PROVIDER_SITE_OTHER): Payer: Medicare Other | Admitting: Family Medicine

## 2021-07-14 ENCOUNTER — Ambulatory Visit
Admission: RE | Admit: 2021-07-14 | Discharge: 2021-07-14 | Disposition: A | Discharge status: Home / Self Care | Payer: Medicare Other | Source: Ambulatory Visit | Attending: Family Medicine | Admitting: Family Medicine

## 2021-07-14 VITALS — BP 132/73 | HR 82 | Resp 16 | Wt 241.3 lb

## 2021-07-14 DIAGNOSIS — W19XXXD Unspecified fall, subsequent encounter: Secondary | ICD-10-CM

## 2021-07-14 DIAGNOSIS — I509 Heart failure, unspecified: Secondary | ICD-10-CM

## 2021-07-14 DIAGNOSIS — J449 Chronic obstructive pulmonary disease, unspecified: Secondary | ICD-10-CM

## 2021-07-14 DIAGNOSIS — D72829 Elevated white blood cell count, unspecified: Secondary | ICD-10-CM | POA: Insufficient documentation

## 2021-07-14 DIAGNOSIS — E1159 Type 2 diabetes mellitus with other circulatory complications: Secondary | ICD-10-CM

## 2021-07-14 DIAGNOSIS — Z1331 Encounter for screening for depression: Secondary | ICD-10-CM

## 2021-07-14 DIAGNOSIS — I152 Hypertension secondary to endocrine disorders: Secondary | ICD-10-CM

## 2021-07-14 DIAGNOSIS — E1129 Type 2 diabetes mellitus with other diabetic kidney complication: Secondary | ICD-10-CM

## 2021-07-14 DIAGNOSIS — R0609 Other forms of dyspnea: Secondary | ICD-10-CM

## 2021-07-14 DIAGNOSIS — Y939 Activity, unspecified: Secondary | ICD-10-CM | POA: Diagnosis not present

## 2021-07-14 DIAGNOSIS — R600 Localized edema: Secondary | ICD-10-CM

## 2021-07-14 DIAGNOSIS — N184 Chronic kidney disease, stage 4 (severe): Secondary | ICD-10-CM | POA: Diagnosis not present

## 2021-07-14 LAB — POCT GLYCOSYLATED HEMOGLOBIN (HGB A1C)
Est. average glucose Bld gHb Est-mCnc: 180
Hemoglobin A1C: 7.9 % — AB (ref 4.0–5.6)

## 2021-07-14 NOTE — Assessment & Plan Note (Signed)
Chronic, likely exacerbated give acute  Lower edema worsened, R > L SOB/DOE with minimal effort, ex lifting leg for shoe removal and unable to complete 2 sentences. Will refer to reestablish routine care

## 2021-07-14 NOTE — Assessment & Plan Note (Signed)
Chronic, exacerbated Has psych team Is on paxil at 40 mg Denies need for changes at this time

## 2021-07-14 NOTE — Assessment & Plan Note (Signed)
Acute on chronic presentation Would benefit from additional nebs for presumed COPD

## 2021-07-14 NOTE — Assessment & Plan Note (Signed)
Chronic, stable Recommend referral to pulmonary for PFTs

## 2021-07-14 NOTE — Assessment & Plan Note (Signed)
Chronic, stable. Goal <130/<80. Denies CP Denies SOB/ DOE Denies low blood pressure/hypotension Denies vision changes No LE Edema noted on exam Continue medication, Coreg 12.5 mg QD, HCTZ, 25 mg daily, Losartan 50 mg daily,  RTC 3 months Seek emergent care if you develop chest pain or chest pressure

## 2021-07-14 NOTE — Assessment & Plan Note (Signed)
Chronic, improved With insulin pump Some lows Continue to recommend balanced, lower carb meals. Smaller meal size, adding snacks. Choosing water as drink of choice and increasing purposeful exercise. A1c now <8% However, CKD stage IV Is followed by endocrine and nephrology Needs to have a solid family idea of what is going on given some concern of lack of understanding from hx of CVA

## 2021-07-14 NOTE — Assessment & Plan Note (Signed)
Chronic, variable Dressings removed from bilateral legs Unna boots with calamine  Fluid blister over L dressing; intact Encouraged not to pop

## 2021-07-14 NOTE — Assessment & Plan Note (Signed)
Recent fall 6 days ago, landing on L knee and R ankle Will send for imaging R lateral ankle sore L knee swollen

## 2021-07-14 NOTE — Assessment & Plan Note (Signed)
Chronic, worsening Medications adjusted A1c has improved Continue to monitor fluid intake Monitor phosphorus and potassium in diet

## 2021-07-15 ENCOUNTER — Encounter (INDEPENDENT_AMBULATORY_CARE_PROVIDER_SITE_OTHER): Payer: Self-pay

## 2021-07-15 ENCOUNTER — Ambulatory Visit (INDEPENDENT_AMBULATORY_CARE_PROVIDER_SITE_OTHER): Payer: Medicare Other | Admitting: Nurse Practitioner

## 2021-07-15 VITALS — BP 127/86 | HR 89 | Resp 17 | Ht 67.0 in | Wt 240.0 lb

## 2021-07-15 DIAGNOSIS — M7989 Other specified soft tissue disorders: Secondary | ICD-10-CM | POA: Diagnosis not present

## 2021-07-15 NOTE — Progress Notes (Signed)
No signs of acute fracture or knee dislocation. Knee does show decreased bone mineralization and joint space narrowing consistent with arthritis.  Please let us know if you have any questions.  Thank you, Gwyneth Sprout, [id number] [place], [place] [address] [phone] (phone) [phone] (fax) [place]

## 2021-07-15 NOTE — Progress Notes (Signed)
History of Present Illness  There is no documented history at this time  Assessments & Plan   There are no diagnoses linked to this encounter.    Additional instructions  Subjective:  Patient presents with venous ulcer of the Bilateral lower extremity.    Procedure:  3 layer unna wrap was placed Bilateral lower extremity.   Plan:   Follow up in one week.  

## 2021-07-17 ENCOUNTER — Ambulatory Visit (INDEPENDENT_AMBULATORY_CARE_PROVIDER_SITE_OTHER): Payer: Medicare Other | Admitting: Cardiology

## 2021-07-17 ENCOUNTER — Encounter: Payer: Self-pay | Admitting: Cardiology

## 2021-07-17 VITALS — BP 140/90 | HR 78 | Ht 66.0 in | Wt 242.0 lb

## 2021-07-17 DIAGNOSIS — E782 Mixed hyperlipidemia: Secondary | ICD-10-CM

## 2021-07-17 DIAGNOSIS — Z6839 Body mass index (BMI) 39.0-39.9, adult: Secondary | ICD-10-CM | POA: Diagnosis not present

## 2021-07-17 DIAGNOSIS — I428 Other cardiomyopathies: Secondary | ICD-10-CM | POA: Diagnosis not present

## 2021-07-17 DIAGNOSIS — I10 Essential (primary) hypertension: Secondary | ICD-10-CM

## 2021-07-17 MED ORDER — TORSEMIDE 20 MG PO TABS: 20.0000 mg | ORAL_TABLET | Freq: Every day | ORAL | 3 refills | Status: DC

## 2021-07-17 MED ORDER — LOSARTAN POTASSIUM 50 MG PO TABS: 50.0000 mg | ORAL_TABLET | Freq: Every day | ORAL | 3 refills | Status: DC

## 2021-07-17 MED ORDER — CARVEDILOL 12.5 MG PO TABS: 12.5000 mg | ORAL_TABLET | Freq: Two times a day (BID) | ORAL | 3 refills | Status: DC

## 2021-07-17 NOTE — Progress Notes (Signed)
Cardiology Office Note:    Date:  07/17/2021   ID:  Jocelyn Wells, DOB 09-05-1955, MRN [id number]  PCP:  Gwyneth Sprout, FNP   [facility] Providers Cardiologist:  None     Referring MD: Gwyneth Sprout, FNP   Chief Complaint  Patient presents with   NEW patient-Referred by PCP for DOE/BLLE edema    Patient is not familiar with the names of most of the medications she is taking.    History of Present Illness:    Jocelyn Wells is a 66 y.o. female with a hx of anxiety, CHF (initial EF 40 to 45%-2017, normalized to 60 to 65%-2019) paroxysmal atrial fibrillation, hypertension, diabetes, former smoker x40+ years, COPD, CVA due to thrombosis of left middle cerebral artery s/p ILR 2017 , DVT on Eliquis, CKD, lumbar spinal stenosis causing back pain presenting with shortness of breath and leg edema.  Patient states having worsening leg edema over the past month.  Was seen at the [facility] vascular clinic where lower extremity was wrapped with dressing.  Patient denies chest pain but endorses shortness of breath with exertion.  Quit smoking 2 months ago.  Has some forgetfulness since incidence of stroke.  Cardiac loop recorder placed likely after CVA.  ILR check with no evidence for A-fib.  Echocardiogram 2019 EF 60 to 65% Echocardiogram 2017 EF 40 to 45% Left heart cath 2017 normal coronary arteries.  Past Medical History:  Diagnosis Date   Anxiety    Cardiomyopathy [facility])    new to her Jan 2017   Chronic kidney disease    COPD (chronic obstructive pulmonary disease) ([place])    Depression    Diabetes mellitus, type II ([place])    HTN (hypertension)    Obesity    Osteoporosis    PONV (postoperative nausea and vomiting)    Stroke [facility])    Jan 2017    Past Surgical History:  Procedure Laterality Date   ABDOMINAL HYSTERECTOMY     ANKLE FRACTURE SURGERY Left 2002   BILATERAL SALPINGOOPHORECTOMY  2000   CARDIAC CATHETERIZATION N/A 03/25/2015   Procedure: Right/Left Heart Cath and  Coronary Angiography;  Surgeon: Belva Crome, MD;  Location: [place] CV LAB;  Service: Cardiovascular;  Laterality: N/A;   CESAREAN SECTION  1978   COLONOSCOPY WITH PROPOFOL N/A 09/22/2016   Procedure: COLONOSCOPY WITH PROPOFOL;  Surgeon: Manya Silvas, MD;  Location: [facility] ENDOSCOPY;  Service: Endoscopy;  Laterality: N/A;   EP IMPLANTABLE DEVICE N/A 08/05/2015   Procedure: Loop Recorder Insertion;  Surgeon: Deboraha Sprang, MD;  Location: [place] CV LAB;  Service: Cardiovascular;  Laterality: N/A;   ESOPHAGOGASTRODUODENOSCOPY (EGD) WITH PROPOFOL N/A 09/22/2016   Procedure: ESOPHAGOGASTRODUODENOSCOPY (EGD) WITH PROPOFOL;  Surgeon: Manya Silvas, MD;  Location: [facility] ENDOSCOPY;  Service: Endoscopy;  Laterality: N/A;   ESOPHAGOGASTRODUODENOSCOPY (EGD) WITH PROPOFOL N/A 12/08/2016   Procedure: ESOPHAGOGASTRODUODENOSCOPY (EGD) WITH PROPOFOL;  Surgeon: Manya Silvas, MD;  Location: [facility] ENDOSCOPY;  Service: Endoscopy;  Laterality: N/A;   KNEE ARTHROSCOPY Left 2005   TEE WITHOUT CARDIOVERSION N/A 01/31/2015   Procedure: TRANSESOPHAGEAL ECHOCARDIOGRAM (TEE);  Surgeon: Lelon Perla, MD;  Location: [facility] ENDOSCOPY;  Service: Cardiovascular;  Laterality: N/A;   [place]  2008    Current Medications: Current Meds  Medication Sig   albuterol (VENTOLIN HFA) 108 (90 Base) MCG/ACT inhaler TAKE 2 PUFFS BY MOUTH EVERY 6 HOURS AS NEEDED FOR WHEEZE OR SHORTNESS OF BREATH  apixaban (ELIQUIS) 5 MG TABS tablet Take 1 tablet (5 mg total) by mouth 2 (two) times daily.   atorvastatin (LIPITOR) 40 MG tablet TAKE 1 TABLET BY MOUTH EVERY DAY   B-D INS SYRINGE 0.5CC/31GX5/16 31G X 5/16" 0.5 ML MISC    denosumab (PROLIA) 60 MG/ML SOSY injection Inject 60 mg into the skin every 6 (six) months.   diclofenac (VOLTAREN) 75 MG EC tablet Take 75 mg by mouth daily.   Dulaglutide 3 MG/0.5ML SOPN Inject into the skin once a week.   fluticasone (FLONASE) 50 MCG/ACT nasal  spray Place 2 sprays into both nostrils daily.   gabapentin (NEURONTIN) 300 MG capsule Take 300 mg by mouth 3 (three) times daily.   glucose blood test strip OneTouch Verio strips   insulin aspart (NOVOLOG) 100 UNIT/ML injection Inject up to 100 units daily in insulin pump   Insulin Disposable Pump (OMNIPOD DASH PODS, GEN 4,) MISC USE 1 POD EACH EVERY 72    HOURS   Insulin Human (INSULIN PUMP) SOLN Per endocrinoloy   loratadine (CLARITIN) 10 MG tablet Take 1 tablet by mouth daily.   ondansetron (ZOFRAN) 4 MG tablet Take 1 tablet (4 mg total) by mouth every 8 (eight) hours as needed for nausea or vomiting.   PARoxetine (PAXIL) 40 MG tablet Take 1 tablet (40 mg total) by mouth daily.   Spacer/Aero-Holding Chambers [facility] DIAMOND) MISC Use with inhaler to ensure medication is delivered throughout lungs   torsemide (DEMADEX) 20 MG tablet Take 1 tablet (20 mg total) by mouth daily.   TRULICITY 3 PN/3.6RW SOPN Inject into the skin.   Vitamin D, Ergocalciferol, (DRISDOL) 1.25 MG (50000 UT) CAPS capsule Take 50,000 Units by mouth once a week. (Sundays)   [DISCONTINUED] carvedilol (COREG) 12.5 MG tablet Take 12.5 mg by mouth 2 (two) times daily with a meal.   [DISCONTINUED] hydrochlorothiazide (HYDRODIURIL) 25 MG tablet Take 25 mg by mouth daily.   [DISCONTINUED] losartan (COZAAR) 25 MG tablet TAKE 2 TABLETS BY MOUTH EVERY DAY     Allergies:   Codeine   Social History   Socioeconomic History   Marital status: Married    Spouse name: Not on file   Number of children: 2   Years of education: Not on file   Highest education level: Bachelor's degree (e.g., BA, AB, BS)  Occupational History   Occupation: disabled    Comment: retired  Tobacco Use   Smoking status: Former    Packs/day: 1.00    Years: 36.00    Total pack years: 36.00    Types: Cigarettes    Start date: 02/08/1974   Smokeless tobacco: Never   Tobacco comments:    Quit 03/2021  Vaping Use   Vaping Use: Never used   Substance and Sexual Activity   Alcohol use: No    Alcohol/week: 0.0 standard drinks of alcohol   Drug use: No   Sexual activity: Yes    Birth control/protection: None  Other Topics Concern   Not on file  Social History Narrative   Lives at home with stepson and husband, dogs and cats   Caffeine  Drinks sweet tea.   Right handed.    Social Determinants of Health   Financial Resource Strain: Low Risk  (05/27/2021)   Overall Financial Resource Strain (CARDIA)    Difficulty of Paying Living Expenses: Not hard at all  Food Insecurity: No Food Insecurity (05/27/2021)   Hunger Vital Sign    Worried About Running Out of Food in the  Last Year: Never true    [place] in the Last Year: Never true  Transportation Needs: No Transportation Needs (05/27/2021)   PRAPARE - Hydrologist (Medical): No    Lack of Transportation (Non-Medical): No  Physical Activity: Inactive (05/27/2021)   Exercise Vital Sign    Days of Exercise per Week: 0 days    Minutes of Exercise per Session: 0 min  Stress: No Stress Concern Present (05/27/2021)   [place]    Feeling of Stress : Not at all  Social Connections: Moderately Isolated (05/27/2021)   Social Connection and Isolation Panel [NHANES]    Frequency of Communication with Friends and Family: More than three times a week    Frequency of Social Gatherings with Friends and Family: Once a week    Attends Religious Services: Never    Marine scientist or Organizations: No    Attends Music therapist: Never    Marital Status: Married     Family History: The patient's family history includes Alcohol abuse in her paternal aunt; Aneurysm in her father; Asthma in her mother; COPD in her brother; Diabetes in her brother and mother; Heart disease in her father and mother. There is no history of Anemia, Arrhythmia, Clotting disorder, Fainting, Heart  attack, Heart failure, Hyperlipidemia, or Hypertension.  ROS:   Please see the history of present illness.     All other systems reviewed and are negative.  EKGs/Labs/Other Studies Reviewed:    The following studies were reviewed today:   EKG:  EKG is  ordered today.  The ekg ordered today demonstrates sinus rhythm, right bundle branch block  Recent Labs: 10/20/2020: ALT 9; BUN 37; Creatinine, Ser 2.62; Hemoglobin 12.8; Platelets 415; Potassium 4.6; Sodium 141  Recent Lipid Panel    Component Value Date/Time   CHOL 271 (H) 10/20/2020 1554   TRIG 231 (H) 10/20/2020 1554   HDL 41 10/20/2020 1554   CHOLHDL 6.6 (H) 10/20/2020 1554   CHOLHDL 5.0 10/06/2017 0330   VLDL 64 (H) 10/06/2017 0330   LDLCALC 186 (H) 10/20/2020 1554     Risk Assessment/Calculations:         Physical Exam:    VS:  BP 140/90 (BP Location: Left Arm, Patient Position: Sitting, Cuff Size: Large)   Pulse 78   Ht '5\' 6"'$  (1.676 m)   Wt 242 lb (109.8 kg)   SpO2 96%   BMI 39.06 kg/m     Wt Readings from Last 3 Encounters:  07/17/21 242 lb (109.8 kg)  07/15/21 240 lb (108.9 kg)  07/14/21 241 lb 4.8 oz (109.5 kg)     GEN:  Well nourished, well developed in no acute distress HEENT: Normal NECK: No JVD; No carotid bruits LYMPHATICS: No lymphadenopathy CARDIAC: RRR, no murmurs, rubs, gallops RESPIRATORY: Diminished breath sounds, no wheezing ABDOMEN: Soft, non-tender, non-distended MUSCULOSKELETAL:  2+ edema; lower extremity wrapped in dressing SKIN: Warm and dry NEUROLOGIC:  Alert and oriented x 3 PSYCHIATRIC:  Normal affect   ASSESSMENT:    1. NICM (nonischemic cardiomyopathy) ([place])   2. Mixed hyperlipidemia   3. Primary hypertension   4. BMI 39.0-39.9,adult    PLAN:    In order of problems listed above:  History of nonischemic cardiomyopathy, EF normalized in 2019.Shortness of breath, leg edema.  Get echocardiogram.  Coreg 12.5 mg twice daily, consolidate losartan to 50 mg daily.  Start  torsemide 20 mg daily.  BMP in 1 week. Hyperlipidemia, continue Lipitor 40 mg daily, increased to 80 at follow-up visit. Hypertension, Coreg, losartan, Toprol-XL as above.  Stop HCTZ. obesity, weight loss advised.  Follow-up after echo.      Medication Adjustments/Labs and Tests Ordered: Current medicines are reviewed at length with the patient today.  Concerns regarding medicines are outlined above.  Orders Placed This Encounter  Procedures   Basic metabolic panel   EKG 86-VHQI   ECHOCARDIOGRAM COMPLETE   Meds ordered this encounter  Medications   carvedilol (COREG) 12.5 MG tablet    Sig: Take 1 tablet (12.5 mg total) by mouth 2 (two) times daily with a meal.    Dispense:  60 tablet    Refill:  3   losartan (COZAAR) 50 MG tablet    Sig: Take 1 tablet (50 mg total) by mouth daily.    Dispense:  30 tablet    Refill:  3   torsemide (DEMADEX) 20 MG tablet    Sig: Take 1 tablet (20 mg total) by mouth daily.    Dispense:  30 tablet    Refill:  3    Patient Instructions  Medication Instructions:   Your physician has recommended you make the following change in your medication:    STOP taking your Hydrochlorothiazide.  2.   TAKE Losartan 50 MG once a day.  2.   TAKE Carvedilol 12.5 MG twice a day.  3.   START Torsemide 20 MG once a day.  4.  Remember to take your Eliquis as 5 MG twice a day  *If you need a refill on your cardiac medications before your next appointment, please call your pharmacy*   Lab Work:  Your physician recommends that you return for lab work (BMP) in: 1 week  - Please go to the [facility]. You will check in at the front desk to the right as you walk into the atrium. Valet Parking is offered if needed. - No appointment needed. You may go any day between 7 am and 6 pm.   Testing/Procedures:  Your physician has requested that you have an echocardiogram. Echocardiography is a painless test that uses sound waves to create images of your  heart. It provides your doctor with information about the size and shape of your heart and how well your heart's chambers and valves are working. This procedure takes approximately one hour. There are no restrictions for this procedure.    Follow-Up: At [facility], you and your health needs are our priority.  As part of our continuing mission to provide you with exceptional heart care, we have created designated Provider Care Teams.  These Care Teams include your primary Cardiologist (physician) and Advanced Practice Providers (APPs -  Physician Assistants and Nurse Practitioners) who all work together to provide you with the care you need, when you need it.  We recommend signing up for the patient portal called "MyChart".  Sign up information is provided on this After Visit Summary.  MyChart is used to connect with patients for Virtual Visits (Telemedicine).  Patients are able to view lab/test results, encounter notes, upcoming appointments, etc.  Non-urgent messages can be sent to your provider as well.   To learn more about what you can do with MyChart, go to [email or web address].    Your next appointment:   6 week(s)  The format for your next appointment:   In Person  Provider:    Kate Sable, MD ONLY  Other Instructions   Important  Information About Sugar         Signed, Kate Sable, MD  07/17/2021 12:39 PM    [facility]

## 2021-07-17 NOTE — Patient Instructions (Addendum)
Medication Instructions:   Your physician has recommended you make the following change in your medication:    STOP taking your Hydrochlorothiazide.  2.   TAKE Losartan 50 MG once a day.  2.   TAKE Carvedilol 12.5 MG twice a day.  3.   START Torsemide 20 MG once a day.  4.  Remember to take your Eliquis as 5 MG twice a day  *If you need a refill on your cardiac medications before your next appointment, please call your pharmacy*   Lab Work:  Your physician recommends that you return for lab work (BMP) in: 1 week  - Please go to the [facility]. You will check in at the front desk to the right as you walk into the atrium. Valet Parking is offered if needed. - No appointment needed. You may go any day between 7 am and 6 pm.   Testing/Procedures:  Your physician has requested that you have an echocardiogram. Echocardiography is a painless test that uses sound waves to create images of your heart. It provides your doctor with information about the size and shape of your heart and how well your heart's chambers and valves are working. This procedure takes approximately one hour. There are no restrictions for this procedure.    Follow-Up: At [facility], you and your health needs are our priority.  As part of our continuing mission to provide you with exceptional heart care, we have created designated Provider Care Teams.  These Care Teams include your primary Cardiologist (physician) and Advanced Practice Providers (APPs -  Physician Assistants and Nurse Practitioners) who all work together to provide you with the care you need, when you need it.  We recommend signing up for the patient portal called "MyChart".  Sign up information is provided on this After Visit Summary.  MyChart is used to connect with patients for Virtual Visits (Telemedicine).  Patients are able to view lab/test results, encounter notes, upcoming appointments, etc.  Non-urgent messages can be sent to your  provider as well.   To learn more about what you can do with MyChart, go to [email or web address].    Your next appointment:   6 week(s)  The format for your next appointment:   In Person  Provider:    Kate Sable, MD ONLY  Other Instructions   Important Information About Sugar

## 2021-07-20 ENCOUNTER — Encounter (INDEPENDENT_AMBULATORY_CARE_PROVIDER_SITE_OTHER): Payer: Self-pay | Admitting: Nurse Practitioner

## 2021-07-22 ENCOUNTER — Ambulatory Visit (INDEPENDENT_AMBULATORY_CARE_PROVIDER_SITE_OTHER): Payer: Medicare Other | Admitting: Nurse Practitioner

## 2021-07-22 VITALS — BP 123/68 | HR 82 | Resp 16 | Wt 243.0 lb

## 2021-07-22 DIAGNOSIS — M7989 Other specified soft tissue disorders: Secondary | ICD-10-CM

## 2021-07-22 NOTE — Progress Notes (Unsigned)
History of Present Illness  There is no documented history at this time  Assessments & Plan   There are no diagnoses linked to this encounter.    Additional instructions  Subjective:  Patient presents with venous ulcer of the Bilateral lower extremity.    Procedure:  3 layer unna wrap was placed Bilateral lower extremity.   Plan:   Follow up in one week.  

## 2021-07-29 ENCOUNTER — Ambulatory Visit (INDEPENDENT_AMBULATORY_CARE_PROVIDER_SITE_OTHER): Payer: Medicare Other | Admitting: Nurse Practitioner

## 2021-07-29 ENCOUNTER — Other Ambulatory Visit (INDEPENDENT_AMBULATORY_CARE_PROVIDER_SITE_OTHER): Payer: Self-pay | Admitting: Nurse Practitioner

## 2021-07-29 ENCOUNTER — Encounter (INDEPENDENT_AMBULATORY_CARE_PROVIDER_SITE_OTHER): Payer: Self-pay

## 2021-07-29 VITALS — BP 144/74 | HR 81 | Resp 21 | Ht 66.0 in | Wt 243.0 lb

## 2021-07-29 DIAGNOSIS — M7989 Other specified soft tissue disorders: Secondary | ICD-10-CM | POA: Diagnosis not present

## 2021-07-29 DIAGNOSIS — T8149XA Infection following a procedure, other surgical site, initial encounter: Secondary | ICD-10-CM | POA: Diagnosis not present

## 2021-07-29 NOTE — Progress Notes (Unsigned)
History of Present Illness  There is no documented history at this time  Assessments & Plan   There are no diagnoses linked to this encounter.    Additional instructions  Subjective:  Patient presents with venous ulcer of the Bilateral lower extremity.    Procedure:  3 layer unna wrap was placed Bilateral lower extremity.   Plan:   Follow up in one week.  

## 2021-07-30 DIAGNOSIS — U071 COVID-19: Secondary | ICD-10-CM | POA: Diagnosis not present

## 2021-07-30 DIAGNOSIS — I509 Heart failure, unspecified: Secondary | ICD-10-CM | POA: Diagnosis not present

## 2021-07-30 DIAGNOSIS — I10 Essential (primary) hypertension: Secondary | ICD-10-CM | POA: Diagnosis not present

## 2021-07-30 DIAGNOSIS — I639 Cerebral infarction, unspecified: Secondary | ICD-10-CM | POA: Diagnosis not present

## 2021-07-30 DIAGNOSIS — N184 Chronic kidney disease, stage 4 (severe): Secondary | ICD-10-CM | POA: Diagnosis not present

## 2021-07-30 DIAGNOSIS — R809 Proteinuria, unspecified: Secondary | ICD-10-CM | POA: Diagnosis not present

## 2021-07-30 DIAGNOSIS — D631 Anemia in chronic kidney disease: Secondary | ICD-10-CM | POA: Diagnosis not present

## 2021-07-30 DIAGNOSIS — N2581 Secondary hyperparathyroidism of renal origin: Secondary | ICD-10-CM | POA: Diagnosis not present

## 2021-07-30 DIAGNOSIS — I129 Hypertensive chronic kidney disease with stage 1 through stage 4 chronic kidney disease, or unspecified chronic kidney disease: Secondary | ICD-10-CM | POA: Diagnosis not present

## 2021-07-30 DIAGNOSIS — E1122 Type 2 diabetes mellitus with diabetic chronic kidney disease: Secondary | ICD-10-CM | POA: Diagnosis not present

## 2021-07-30 DIAGNOSIS — F32A Depression, unspecified: Secondary | ICD-10-CM | POA: Diagnosis not present

## 2021-07-30 DIAGNOSIS — E663 Overweight: Secondary | ICD-10-CM | POA: Diagnosis not present

## 2021-08-02 ENCOUNTER — Encounter (INDEPENDENT_AMBULATORY_CARE_PROVIDER_SITE_OTHER): Payer: Self-pay | Admitting: Nurse Practitioner

## 2021-08-03 LAB — AEROBIC CULTURE

## 2021-08-04 ENCOUNTER — Other Ambulatory Visit (INDEPENDENT_AMBULATORY_CARE_PROVIDER_SITE_OTHER): Payer: Self-pay | Admitting: Nurse Practitioner

## 2021-08-04 DIAGNOSIS — M79606 Pain in leg, unspecified: Secondary | ICD-10-CM

## 2021-08-05 ENCOUNTER — Encounter (INDEPENDENT_AMBULATORY_CARE_PROVIDER_SITE_OTHER): Payer: Self-pay | Admitting: Nurse Practitioner

## 2021-08-05 ENCOUNTER — Ambulatory Visit (INDEPENDENT_AMBULATORY_CARE_PROVIDER_SITE_OTHER): Payer: Medicare Other | Admitting: Nurse Practitioner

## 2021-08-05 ENCOUNTER — Ambulatory Visit (INDEPENDENT_AMBULATORY_CARE_PROVIDER_SITE_OTHER): Payer: Medicare Other

## 2021-08-05 VITALS — BP 158/77 | HR 72 | Resp 16 | Wt 237.0 lb

## 2021-08-05 DIAGNOSIS — I872 Venous insufficiency (chronic) (peripheral): Secondary | ICD-10-CM

## 2021-08-05 DIAGNOSIS — I7025 Atherosclerosis of native arteries of other extremities with ulceration: Secondary | ICD-10-CM

## 2021-08-05 DIAGNOSIS — M79606 Pain in leg, unspecified: Secondary | ICD-10-CM | POA: Diagnosis not present

## 2021-08-07 ENCOUNTER — Telehealth (INDEPENDENT_AMBULATORY_CARE_PROVIDER_SITE_OTHER): Payer: Self-pay

## 2021-08-07 DIAGNOSIS — N186 End stage renal disease: Secondary | ICD-10-CM

## 2021-08-07 MED ORDER — SULFAMETHOXAZOLE-TRIMETHOPRIM 800-160 MG PO TABS: 1.0000 | ORAL_TABLET | Freq: Two times a day (BID) | ORAL | 0 refills | Status: DC

## 2021-08-07 NOTE — Telephone Encounter (Signed)
Spoke with the patient and she is scheduled with Dr. Delana Meyer on 08/12/21 with a 10:00 am arrival time to the [facility] for her LLE angio. Pre-procedure instructions were discussed and patient understood.

## 2021-08-11 NOTE — Progress Notes (Signed)
Subjective:    Patient ID: Jocelyn Wells, female    DOB: 1955-08-30, 66 y.o.   MRN: [id number] Chief Complaint  Patient presents with   Follow-up    Ultrasound follow up    Jocelyn Wells is a 66 year old female who presents today for interim evaluation.  Previously the patient fell and developed a small wound on her left shin area just below her knee.  Today the area is much larger and appears worse with a area concerning for necrosis in the center.  Previously the patient had ABIs done with an ABI of 0.76 on the left.  Today, the patient had noninvasive studies regarding her veins.  It shows deep venous insufficiency in her right lower extremity with bilateral great saphenous vein reflux although not significant.  The wound itself would not be a component of venous ulceration due to the location and how it occurred.    Review of Systems  Cardiovascular:  Positive for leg swelling.  Skin:  Negative for wound.  All other systems reviewed and are negative.      Objective:   Physical Exam Vitals reviewed.  HENT:     Head: Normocephalic.  Cardiovascular:     Rate and Rhythm: Normal rate.  Pulmonary:     Effort: Pulmonary effort is normal.  Musculoskeletal:     Right lower leg: Edema present.     Left lower leg: Edema present.  Skin:    General: Skin is warm and dry.  Neurological:     Mental Status: She is alert and oriented to person, place, and time.  Psychiatric:        Mood and Affect: Mood normal.        Behavior: Behavior normal.        Thought Content: Thought content normal.        Judgment: Judgment normal.     BP (!) 158/77 (BP Location: Right Arm)   Pulse 72   Resp 16   Wt 237 lb (107.5 kg)   BMI 38.25 kg/m   Past Medical History:  Diagnosis Date   Anxiety    Cardiomyopathy [facility])    new to her Jan 2017   Chronic kidney disease    COPD (chronic obstructive pulmonary disease) ([place])    Depression    Diabetes mellitus, type II ([place])    HTN  (hypertension)    Obesity    Osteoporosis    PONV (postoperative nausea and vomiting)    Stroke [facility])    Jan 2017    Social History   Socioeconomic History   Marital status: Married    Spouse name: Not on file   Number of children: 2   Years of education: Not on file   Highest education level: Bachelor's degree (e.g., BA, AB, BS)  Occupational History   Occupation: disabled    Comment: retired  Tobacco Use   Smoking status: Former    Packs/day: 1.00    Years: 36.00    Total pack years: 36.00    Types: Cigarettes    Start date: 02/08/1974   Smokeless tobacco: Never   Tobacco comments:    Quit 03/2021  Vaping Use   Vaping Use: Never used  Substance and Sexual Activity   Alcohol use: No    Alcohol/week: 0.0 standard drinks of alcohol   Drug use: No   Sexual activity: Yes    Birth control/protection: None  Other Topics Concern   Not on file  Social History Narrative  Lives at home with stepson and husband, dogs and cats   Caffeine  Drinks sweet tea.   Right handed.    Social Determinants of Health   Financial Resource Strain: Low Risk  (05/27/2021)   Overall Financial Resource Strain (CARDIA)    Difficulty of Paying Living Expenses: Not hard at all  Food Insecurity: No Food Insecurity (05/27/2021)   Hunger Vital Sign    Worried About Running Out of Food in the Last Year: Never true    Ran Out of Food in the Last Year: Never true  Transportation Needs: No Transportation Needs (05/27/2021)   PRAPARE - Hydrologist (Medical): No    Lack of Transportation (Non-Medical): No  Physical Activity: Inactive (05/27/2021)   Exercise Vital Sign    Days of Exercise per Week: 0 days    Minutes of Exercise per Session: 0 min  Stress: No Stress Concern Present (05/27/2021)   [place]    Feeling of Stress : Not at all  Social Connections: Moderately Isolated (05/27/2021)   Social  Connection and Isolation Panel [NHANES]    Frequency of Communication with Friends and Family: More than three times a week    Frequency of Social Gatherings with Friends and Family: Once a week    Attends Religious Services: Never    Marine scientist or Organizations: No    Attends Archivist Meetings: Never    Marital Status: Married  Human resources officer Violence: Not At Risk (05/27/2021)   Humiliation, Afraid, Rape, and Kick questionnaire    Fear of Current or Ex-Partner: No    Emotionally Abused: No    Physically Abused: No    Sexually Abused: No    Past Surgical History:  Procedure Laterality Date   ABDOMINAL HYSTERECTOMY     ANKLE FRACTURE SURGERY Left 2002   BILATERAL SALPINGOOPHORECTOMY  2000   CARDIAC CATHETERIZATION N/A 03/25/2015   Procedure: Right/Left Heart Cath and Coronary Angiography;  Surgeon: Belva Crome, MD;  Location: [place] [facility];  Service: Cardiovascular;  Laterality: N/A;   CESAREAN SECTION  1978   COLONOSCOPY WITH PROPOFOL N/A 09/22/2016   Procedure: COLONOSCOPY WITH PROPOFOL;  Surgeon: Manya Silvas, MD;  Location: [facility] ENDOSCOPY;  Service: Endoscopy;  Laterality: N/A;   EP IMPLANTABLE DEVICE N/A 08/05/2015   Procedure: Loop Recorder Insertion;  Surgeon: Deboraha Sprang, MD;  Location: [place] [facility];  Service: Cardiovascular;  Laterality: N/A;   ESOPHAGOGASTRODUODENOSCOPY (EGD) WITH PROPOFOL N/A 09/22/2016   Procedure: ESOPHAGOGASTRODUODENOSCOPY (EGD) WITH PROPOFOL;  Surgeon: Manya Silvas, MD;  Location: [facility] ENDOSCOPY;  Service: Endoscopy;  Laterality: N/A;   ESOPHAGOGASTRODUODENOSCOPY (EGD) WITH PROPOFOL N/A 12/08/2016   Procedure: ESOPHAGOGASTRODUODENOSCOPY (EGD) WITH PROPOFOL;  Surgeon: Manya Silvas, MD;  Location: [facility] ENDOSCOPY;  Service: Endoscopy;  Laterality: N/A;   KNEE ARTHROSCOPY Left 2005   TEE WITHOUT CARDIOVERSION N/A 01/31/2015   Procedure: TRANSESOPHAGEAL ECHOCARDIOGRAM (TEE);  Surgeon: Lelon Perla,  MD;  Location: [facility] ENDOSCOPY;  Service: Cardiovascular;  Laterality: N/A;   [place]  2008    Family History  Problem Relation Age of Onset   Heart disease Mother        died from CHF   Asthma Mother    Diabetes Mother    Heart disease Father    Aneurysm Father    COPD Brother    Diabetes Brother  Alcohol abuse Paternal Aunt    Anemia Neg Hx    Arrhythmia Neg Hx    Clotting disorder Neg Hx    Fainting Neg Hx    Heart attack Neg Hx    Heart failure Neg Hx    Hyperlipidemia Neg Hx    Hypertension Neg Hx     Allergies  Allergen Reactions   Codeine Nausea And Vomiting       Latest Ref Rng & Units 10/20/2020    3:54 PM 07/10/2020    3:56 PM 06/01/2020    4:40 AM  CBC  WBC 3.4 - 10.8 x10E3/uL 13.2  13.0  13.6   Hemoglobin 11.1 - 15.9 g/dL 12.8  13.3  12.7   Hematocrit 34.0 - 46.6 % 41.2  42.7  38.9   Platelets 150 - 450 x10E3/uL 415  435  384       CMP     Component Value Date/Time   NA 141 10/20/2020 1554   K 4.6 10/20/2020 1554   CL 103 10/20/2020 1554   CO2 23 10/20/2020 1554   GLUCOSE 205 (H) 10/20/2020 1554   GLUCOSE 141 (H) 06/01/2020 0440   BUN 37 (H) 10/20/2020 1554   CREATININE 2.62 (H) 10/20/2020 1554   CREATININE 1.37 (H) 03/21/2015 1451   CALCIUM 8.6 (L) 10/20/2020 1554   PROT 6.3 10/20/2020 1554   ALBUMIN 3.6 (L) 10/20/2020 1554   AST 8 10/20/2020 1554   ALT 9 10/20/2020 1554   ALKPHOS 98 10/20/2020 1554   BILITOT <0.2 10/20/2020 1554   GFRNONAA 31 (L) 06/01/2020 0440   GFRAA 33 (L) 04/23/2019 1446     VAS [place] ABI WITH/WO TBI  Result Date: 07/02/2021  LOWER EXTREMITY DOPPLER STUDY Patient Name:  Jocelyn Wells  Date of Exam:   07/01/2021 Medical Rec #: [id number]       Accession #:    [id number] Date of Birth: [birth year]       Patient Gender: F Patient Age:   72 years Exam Location:  [facility] Procedure:      VAS [place] ABI WITH/WO TBI Referring Phys: [name]  --------------------------------------------------------------------------------  Indications: Rest pain.  Performing Technologist: Almira Coaster RVS  Examination Guidelines: A complete evaluation includes at minimum, Doppler waveform signals and systolic blood pressure reading at the level of bilateral brachial, anterior tibial, and posterior tibial arteries, when vessel segments are accessible. Bilateral testing is considered an integral part of a complete examination. Photoelectric Plethysmograph (PPG) waveforms and toe systolic pressure readings are included as required and additional duplex testing as needed. Limited examinations for reoccurring indications may be performed as noted.  ABI Findings: +---------+------------------+-----+---------+--------+ Right    Rt Pressure (mmHg)IndexWaveform Comment  +---------+------------------+-----+---------+--------+ Brachial 179                                      +---------+------------------+-----+---------+--------+ ATA      164               0.92 biphasic          +---------+------------------+-----+---------+--------+ PTA      220               1.23 triphasic         +---------+------------------+-----+---------+--------+ Great Toe160               0.89 Normal            +---------+------------------+-----+---------+--------+ +---------+------------------+-----+--------+-------+  Left     Lt Pressure (mmHg)IndexWaveformComment +---------+------------------+-----+--------+-------+ Brachial 174                                    +---------+------------------+-----+--------+-------+ ATA      136               0.76 biphasic        +---------+------------------+-----+--------+-------+ PTA      121               0.68 biphasic        +---------+------------------+-----+--------+-------+ Great Toe107               0.60 Abnormal        +---------+------------------+-----+--------+-------+  +-------+-----------+-----------+------------+------------+ ABI/TBIToday's ABIToday's TBIPrevious ABIPrevious TBI +-------+-----------+-----------+------------+------------+ Right  1.23       .89                                 +-------+-----------+-----------+------------+------------+ Left   .76        .60                                 +-------+-----------+-----------+------------+------------+  Summary: Right: Resting right ankle-brachial index is within normal range. No evidence of significant right lower extremity arterial disease. The right toe-brachial index is normal. Left: Resting left ankle-brachial index indicates moderate left lower extremity arterial disease. The left toe-brachial index is abnormal.  *See table(s) above for measurements and observations.  Electronically signed by [name] MD on 07/02/2021 at 7:12:17 PM.    Final        Assessment & Plan:   1. Atherosclerosis of native arteries of the extremities with ulceration ([place])  Recommend:  The patient has evidence of severe atherosclerotic changes of both lower extremities associated with ulceration and tissue loss of the left foot.  This represents a limb threatening ischemia and places the patient at the risk for left limb loss.  Patient should undergo angiography of the left lower extremity with the hope for intervention for limb salvage.  The risks and benefits as well as the alternative therapies was discussed in detail with the patient.  All questions were answered.  Patient agrees to proceed with left angiography.  The patient will follow up with me in the office after the procedure.    2. Venous (peripheral) insufficiency We will remove the patient out of the wraps today.  The swelling is much better than previous.  She is advised to transition into medical grade compression stockings.  She is also advised to elevate her lower extremities and we will reevaluate her lower extremity edema when she  returns for postoperative follow-up.   Current Outpatient Medications on File Prior to Visit  Medication Sig Dispense Refill   albuterol (VENTOLIN HFA) 108 (90 Base) MCG/ACT inhaler TAKE 2 PUFFS BY MOUTH EVERY 6 HOURS AS NEEDED FOR WHEEZE OR SHORTNESS OF BREATH 8.5 each 2   apixaban (ELIQUIS) 5 MG TABS tablet Take 1 tablet (5 mg total) by mouth 2 (two) times daily. 180 tablet 3   atorvastatin (LIPITOR) 40 MG tablet TAKE 1 TABLET BY MOUTH EVERY DAY 90 tablet 2   B-D INS SYRINGE 0.5CC/31GX5/16 31G X 5/16" 0.5 ML MISC      carvedilol (COREG) 12.5 MG tablet Take 1 tablet (12.5 mg  total) by mouth 2 (two) times daily with a meal. 60 tablet 3   denosumab (PROLIA) 60 MG/ML SOSY injection Inject 60 mg into the skin every 6 (six) months.     diclofenac (VOLTAREN) 75 MG EC tablet Take 75 mg by mouth daily.     Dulaglutide 3 MG/0.5ML SOPN Inject into the skin once a week.     fluticasone (FLONASE) 50 MCG/ACT nasal spray Place 2 sprays into both nostrils daily. 16 g 6   gabapentin (NEURONTIN) 300 MG capsule Take 300 mg by mouth 3 (three) times daily.     glucose blood test strip OneTouch Verio strips     insulin aspart (NOVOLOG) 100 UNIT/ML injection Inject up to 100 units daily in insulin pump     Insulin Disposable Pump (OMNIPOD DASH PODS, GEN 4,) MISC USE 1 POD EACH EVERY 72    HOURS     Insulin Human (INSULIN PUMP) SOLN Per endocrinoloy     loratadine (CLARITIN) 10 MG tablet Take 1 tablet by mouth daily.     losartan (COZAAR) 50 MG tablet Take 1 tablet (50 mg total) by mouth daily. 30 tablet 3   ondansetron (ZOFRAN) 4 MG tablet Take 1 tablet (4 mg total) by mouth every 8 (eight) hours as needed for nausea or vomiting. 20 tablet 0   PARoxetine (PAXIL) 40 MG tablet Take 1 tablet (40 mg total) by mouth daily. 90 tablet 0   Spacer/Aero-Holding Chambers (OPTICHAMBER DIAMOND) MISC Use with inhaler to ensure medication is delivered throughout lungs 1 each 0   torsemide (DEMADEX) 20 MG tablet Take 1 tablet  (20 mg total) by mouth daily. 30 tablet 3   TRULICITY 3 YT/0.1SW SOPN Inject into the skin.     Vitamin D, Ergocalciferol, (DRISDOL) 1.25 MG (50000 UT) CAPS capsule Take 50,000 Units by mouth once a week. (Sundays)     hydrochlorothiazide (HYDRODIURIL) 25 MG tablet Take 25 mg by mouth daily. (Patient not taking: Reported on 08/05/2021)     No current facility-administered medications on file prior to visit.    There are no Patient Instructions on file for this visit. No follow-ups on file.   Kris Hartmann, NP

## 2021-08-11 NOTE — Progress Notes (Incomplete)
Subjective:    Patient ID: Jocelyn Wells, female    DOB: November 28, 1955, 66 y.o.   MRN: [id number] Chief Complaint  Patient presents with  . Follow-up    Ultrasound follow up    HPI  Review of Systems     Objective:   Physical Exam  BP (!) 158/77 (BP Location: Right Arm)   Pulse 72   Resp 16   Wt 237 lb (107.5 kg)   BMI 38.25 kg/m   Past Medical History:  Diagnosis Date  . Anxiety   . Cardiomyopathy [facility])    new to her Jan 2017  . Chronic kidney disease   . COPD (chronic obstructive pulmonary disease) ([place])   . Depression   . Diabetes mellitus, type II ([place])   . HTN (hypertension)   . Obesity   . Osteoporosis   . PONV (postoperative nausea and vomiting)   . Stroke [facility])    Jan 2017    Social History   Socioeconomic History  . Marital status: Married    Spouse name: Not on file  . Number of children: 2  . Years of education: Not on file  . Highest education level: Bachelor's degree (e.g., BA, AB, BS)  Occupational History  . Occupation: disabled    Comment: retired  Tobacco Use  . Smoking status: Former    Packs/day: 1.00    Years: 36.00    Total pack years: 36.00    Types: Cigarettes    Start date: 02/08/1974  . Smokeless tobacco: Never  . Tobacco comments:    Quit 03/2021  Vaping Use  . Vaping Use: Never used  Substance and Sexual Activity  . Alcohol use: No    Alcohol/week: 0.0 standard drinks of alcohol  . Drug use: No  . Sexual activity: Yes    Birth control/protection: None  Other Topics Concern  . Not on file  Social History Narrative   Lives at home with stepson and husband, dogs and cats   Caffeine  Drinks sweet tea.   Right handed.    Social Determinants of Health   Financial Resource Strain: Low Risk  (05/27/2021)   Overall Financial Resource Strain (CARDIA)   . Difficulty of Paying Living Expenses: Not hard at all  Food Insecurity: No Food Insecurity (05/27/2021)   Hunger Vital Sign   . Worried About Charity fundraiser in the  Last Year: Never true   . Ran Out of Food in the Last Year: Never true  Transportation Needs: No Transportation Needs (05/27/2021)   PRAPARE - Transportation   . Lack of Transportation (Medical): No   . Lack of Transportation (Non-Medical): No  Physical Activity: Inactive (05/27/2021)   Exercise Vital Sign   . Days of Exercise per Week: 0 days   . Minutes of Exercise per Session: 0 min  Stress: No Stress Concern Present (05/27/2021)   [place]   . Feeling of Stress : Not at all  Social Connections: Moderately Isolated (05/27/2021)   Social Connection and Isolation Panel [NHANES]   . Frequency of Communication with Friends and Family: More than three times a week   . Frequency of Social Gatherings with Friends and Family: Once a week   . Attends Religious Services: Never   . Active Member of Clubs or Organizations: No   . Attends Archivist Meetings: Never   . Marital Status: Married  Human resources officer Violence: Not At Risk (05/27/2021)  Humiliation, Afraid, Rape, and Kick questionnaire   . Fear of Current or Ex-Partner: No   . Emotionally Abused: No   . Physically Abused: No   . Sexually Abused: No    Past Surgical History:  Procedure Laterality Date  . ABDOMINAL HYSTERECTOMY    . ANKLE FRACTURE SURGERY Left 2002  . BILATERAL SALPINGOOPHORECTOMY  2000  . CARDIAC CATHETERIZATION N/A 03/25/2015   Procedure: Right/Left Heart Cath and Coronary Angiography;  Surgeon: Belva Crome, MD;  Location: [place] [facility];  Service: Cardiovascular;  Laterality: N/A;  . [place]  . COLONOSCOPY WITH PROPOFOL N/A 09/22/2016   Procedure: COLONOSCOPY WITH PROPOFOL;  Surgeon: Manya Silvas, MD;  Location: [facility] ENDOSCOPY;  Service: Endoscopy;  Laterality: N/A;  . EP IMPLANTABLE DEVICE N/A 08/05/2015   Procedure: Loop Recorder Insertion;  Surgeon: Deboraha Sprang, MD;  Location: [place] [facility];  Service:  Cardiovascular;  Laterality: N/A;  . ESOPHAGOGASTRODUODENOSCOPY (EGD) WITH PROPOFOL N/A 09/22/2016   Procedure: ESOPHAGOGASTRODUODENOSCOPY (EGD) WITH PROPOFOL;  Surgeon: Manya Silvas, MD;  Location: [facility] ENDOSCOPY;  Service: Endoscopy;  Laterality: N/A;  . ESOPHAGOGASTRODUODENOSCOPY (EGD) WITH PROPOFOL N/A 12/08/2016   Procedure: ESOPHAGOGASTRODUODENOSCOPY (EGD) WITH PROPOFOL;  Surgeon: Manya Silvas, MD;  Location: [facility] ENDOSCOPY;  Service: Endoscopy;  Laterality: N/A;  . KNEE ARTHROSCOPY Left 2005  . TEE WITHOUT CARDIOVERSION N/A 01/31/2015   Procedure: TRANSESOPHAGEAL ECHOCARDIOGRAM (TEE);  Surgeon: Lelon Perla, MD;  Location: [place];  Service: Cardiovascular;  Laterality: N/A;  . [place]  . ULNAR NERVE TRANSPOSITION  2008    Family History  Problem Relation Age of Onset  . Heart disease Mother        died from CHF  . Asthma Mother   . Diabetes Mother   . Heart disease Father   . Aneurysm Father   . COPD Brother   . Diabetes Brother   . Alcohol abuse Paternal Aunt   . Anemia Neg Hx   . Arrhythmia Neg Hx   . Clotting disorder Neg Hx   . Fainting Neg Hx   . Heart attack Neg Hx   . Heart failure Neg Hx   . Hyperlipidemia Neg Hx   . Hypertension Neg Hx     Allergies  Allergen Reactions  . Codeine Nausea And Vomiting       Latest Ref Rng & Units 10/20/2020    3:54 PM 07/10/2020    3:56 PM 06/01/2020    4:40 AM  CBC  WBC 3.4 - 10.8 x10E3/uL 13.2  13.0  13.6   Hemoglobin 11.1 - 15.9 g/dL 12.8  13.3  12.7   Hematocrit 34.0 - 46.6 % 41.2  42.7  38.9   Platelets 150 - 450 x10E3/uL 415  435  384       CMP     Component Value Date/Time   NA 141 10/20/2020 1554   K 4.6 10/20/2020 1554   CL 103 10/20/2020 1554   CO2 23 10/20/2020 1554   GLUCOSE 205 (H) 10/20/2020 1554   GLUCOSE 141 (H) 06/01/2020 0440   BUN 37 (H) 10/20/2020 1554   CREATININE 2.62 (H) 10/20/2020 1554   CREATININE 1.37 (H) 03/21/2015 1451   CALCIUM 8.6 (L) 10/20/2020  1554   PROT 6.3 10/20/2020 1554   ALBUMIN 3.6 (L) 10/20/2020 1554   AST 8 10/20/2020 1554   ALT 9 10/20/2020 1554   ALKPHOS 98 10/20/2020 1554   BILITOT <0.2 10/20/2020 [place] (  L) 06/01/2020 0440   GFRAA 33 (L) 04/23/2019 1446     VAS [place] ABI WITH/WO TBI  Result Date: 07/02/2021  LOWER EXTREMITY DOPPLER STUDY Patient Name:  CARO BRUNDIDGE  Date of Exam:   07/01/2021 Medical Rec #: [id number]       Accession #:    [id number] Date of Birth: Sep 03, 1955       Patient Gender: F Patient Age:   81 years Exam Location:  [facility] Procedure:      VAS [place] ABI WITH/WO TBI Referring Phys: [name] --------------------------------------------------------------------------------  Indications: Rest pain.  Performing Technologist: Almira Coaster RVS  Examination Guidelines: A complete evaluation includes at minimum, Doppler waveform signals and systolic blood pressure reading at the level of bilateral brachial, anterior tibial, and posterior tibial arteries, when vessel segments are accessible. Bilateral testing is considered an integral part of a complete examination. Photoelectric Plethysmograph (PPG) waveforms and toe systolic pressure readings are included as required and additional duplex testing as needed. Limited examinations for reoccurring indications may be performed as noted.  ABI Findings: +---------+------------------+-----+---------+--------+ Right    Rt Pressure (mmHg)IndexWaveform Comment  +---------+------------------+-----+---------+--------+ Brachial 179                                      +---------+------------------+-----+---------+--------+ ATA      164               0.92 biphasic          +---------+------------------+-----+---------+--------+ PTA      220               1.23 triphasic         +---------+------------------+-----+---------+--------+ Great Toe160               0.89 Normal             +---------+------------------+-----+---------+--------+ +---------+------------------+-----+--------+-------+ Left     Lt Pressure (mmHg)IndexWaveformComment +---------+------------------+-----+--------+-------+ Brachial 174                                    +---------+------------------+-----+--------+-------+ ATA      136               0.76 biphasic        +---------+------------------+-----+--------+-------+ PTA      121               0.68 biphasic        +---------+------------------+-----+--------+-------+ Great Toe107               0.60 Abnormal        +---------+------------------+-----+--------+-------+ +-------+-----------+-----------+------------+------------+ ABI/TBIToday's ABIToday's TBIPrevious ABIPrevious TBI +-------+-----------+-----------+------------+------------+ Right  1.23       .89                                 +-------+-----------+-----------+------------+------------+ Left   .76        .60                                 +-------+-----------+-----------+------------+------------+  Summary: Right: Resting right ankle-brachial index is within normal range. No evidence of significant right lower extremity arterial disease. The right toe-brachial index is normal. Left: Resting left ankle-brachial index indicates moderate left lower extremity  arterial disease. The left toe-brachial index is abnormal.  *See table(s) above for measurements and observations.  Electronically signed by [name] MD on 07/02/2021 at 7:12:17 PM.    Final        Assessment & Plan:   1. Atherosclerosis of native arteries of the extremities with ulceration ([place])  Recommend:  The patient has evidence of severe atherosclerotic changes of both lower extremities associated with ulceration and tissue loss of the left foot.  This represents a limb threatening ischemia and places the patient at the risk for left limb loss.  Patient should undergo angiography of the  left lower extremity with the hope for intervention for limb salvage.  The risks and benefits as well as the alternative therapies was discussed in detail with the patient.  All questions were answered.  Patient agrees to proceed with left angiography.  The patient will follow up with me in the office after the procedure.    2. Venous (peripheral) insufficiency We will remove the patient out of the wraps today.  The swelling is much better than previous.  She is advised to transition into medical grade compression stockings.  She is also advised to elevate her lower extremities and we will reevaluate her lower extremity edema when she returns for postoperative follow-up.   Current Outpatient Medications on File Prior to Visit  Medication Sig Dispense Refill  . albuterol (VENTOLIN HFA) 108 (90 Base) MCG/ACT inhaler TAKE 2 PUFFS BY MOUTH EVERY 6 HOURS AS NEEDED FOR WHEEZE OR SHORTNESS OF BREATH 8.5 each 2  . apixaban (ELIQUIS) 5 MG TABS tablet Take 1 tablet (5 mg total) by mouth 2 (two) times daily. 180 tablet 3  . atorvastatin (LIPITOR) 40 MG tablet TAKE 1 TABLET BY MOUTH EVERY DAY 90 tablet 2  . B-D INS SYRINGE 0.5CC/31GX5/16 31G X 5/16" 0.5 ML MISC     . carvedilol (COREG) 12.5 MG tablet Take 1 tablet (12.5 mg total) by mouth 2 (two) times daily with a meal. 60 tablet 3  . denosumab (PROLIA) 60 MG/ML SOSY injection Inject 60 mg into the skin every 6 (six) months.    . diclofenac (VOLTAREN) 75 MG EC tablet Take 75 mg by mouth daily.    . Dulaglutide 3 MG/0.5ML SOPN Inject into the skin once a week.    . fluticasone (FLONASE) 50 MCG/ACT nasal spray Place 2 sprays into both nostrils daily. 16 g 6  . gabapentin (NEURONTIN) 300 MG capsule Take 300 mg by mouth 3 (three) times daily.    Marland Kitchen glucose blood test strip OneTouch Verio strips    . insulin aspart (NOVOLOG) 100 UNIT/ML injection Inject up to 100 units daily in insulin pump    . Insulin Disposable Pump (OMNIPOD DASH PODS, GEN 4,) MISC USE 1 POD  EACH EVERY 72    HOURS    . Insulin Human (INSULIN PUMP) SOLN Per endocrinoloy    . loratadine (CLARITIN) 10 MG tablet Take 1 tablet by mouth daily.    Marland Kitchen losartan (COZAAR) 50 MG tablet Take 1 tablet (50 mg total) by mouth daily. 30 tablet 3  . ondansetron (ZOFRAN) 4 MG tablet Take 1 tablet (4 mg total) by mouth every 8 (eight) hours as needed for nausea or vomiting. 20 tablet 0  . PARoxetine (PAXIL) 40 MG tablet Take 1 tablet (40 mg total) by mouth daily. 90 tablet 0  . Spacer/Aero-Holding [facility] DIAMOND) MISC Use with inhaler to ensure medication is delivered throughout lungs 1 each 0  .  torsemide (DEMADEX) 20 MG tablet Take 1 tablet (20 mg total) by mouth daily. 30 tablet 3  . TRULICITY 3 GY/6.9SW SOPN Inject into the skin.    . Vitamin D, Ergocalciferol, (DRISDOL) 1.25 MG (50000 UT) CAPS capsule Take 50,000 Units by mouth once a week. (Sundays)    . hydrochlorothiazide (HYDRODIURIL) 25 MG tablet Take 25 mg by mouth daily. (Patient not taking: Reported on 08/05/2021)     No current facility-administered medications on file prior to visit.    There are no Patient Instructions on file for this visit. No follow-ups on file.   Kris Hartmann, NP

## 2021-08-11 NOTE — H&P (View-Only) (Signed)
Subjective:    Patient ID: Jocelyn Wells, female    DOB: 03-21-1955, 66 y.o.   MRN: [id number] Chief Complaint  Patient presents with   Follow-up    Ultrasound follow up    Jocelyn Wells is a 66 year old female who presents today for interim evaluation.  Previously the patient fell and developed a small wound on her left shin area just below her knee.  Today the area is much larger and appears worse with a area concerning for necrosis in the center.  Previously the patient had ABIs done with an ABI of 0.76 on the left.  Today, the patient had noninvasive studies regarding her veins.  It shows deep venous insufficiency in her right lower extremity with bilateral great saphenous vein reflux although not significant.  The wound itself would not be a component of venous ulceration due to the location and how it occurred.    Review of Systems  Cardiovascular:  Positive for leg swelling.  Skin:  Negative for wound.  All other systems reviewed and are negative.      Objective:   Physical Exam Vitals reviewed.  HENT:     Head: Normocephalic.  Cardiovascular:     Rate and Rhythm: Normal rate.  Pulmonary:     Effort: Pulmonary effort is normal.  Musculoskeletal:     Right lower leg: Edema present.     Left lower leg: Edema present.  Skin:    General: Skin is warm and dry.  Neurological:     Mental Status: She is alert and oriented to person, place, and time.  Psychiatric:        Mood and Affect: Mood normal.        Behavior: Behavior normal.        Thought Content: Thought content normal.        Judgment: Judgment normal.     BP (!) 158/77 (BP Location: Right Arm)   Pulse 72   Resp 16   Wt 237 lb (107.5 kg)   BMI 38.25 kg/m   Past Medical History:  Diagnosis Date   Anxiety    Cardiomyopathy [facility])    new to her Jan 2017   Chronic kidney disease    COPD (chronic obstructive pulmonary disease) ([place])    Depression    Diabetes mellitus, type II ([place])    HTN  (hypertension)    Obesity    Osteoporosis    PONV (postoperative nausea and vomiting)    Stroke [facility])    Jan 2017    Social History   Socioeconomic History   Marital status: Married    Spouse name: Not on file   Number of children: 2   Years of education: Not on file   Highest education level: Bachelor's degree (e.g., BA, AB, BS)  Occupational History   Occupation: disabled    Comment: retired  Tobacco Use   Smoking status: Former    Packs/day: 1.00    Years: 36.00    Total pack years: 36.00    Types: Cigarettes    Start date: 02/08/1974   Smokeless tobacco: Never   Tobacco comments:    Quit 03/2021  Vaping Use   Vaping Use: Never used  Substance and Sexual Activity   Alcohol use: No    Alcohol/week: 0.0 standard drinks of alcohol   Drug use: No   Sexual activity: Yes    Birth control/protection: None  Other Topics Concern   Not on file  Social History Narrative  Lives at home with stepson and husband, dogs and cats   Caffeine  Drinks sweet tea.   Right handed.    Social Determinants of Health   Financial Resource Strain: Low Risk  (05/27/2021)   Overall Financial Resource Strain (CARDIA)    Difficulty of Paying Living Expenses: Not hard at all  Food Insecurity: No Food Insecurity (05/27/2021)   Hunger Vital Sign    Worried About Running Out of Food in the Last Year: Never true    Ran Out of Food in the Last Year: Never true  Transportation Needs: No Transportation Needs (05/27/2021)   PRAPARE - Hydrologist (Medical): No    Lack of Transportation (Non-Medical): No  Physical Activity: Inactive (05/27/2021)   Exercise Vital Sign    Days of Exercise per Week: 0 days    Minutes of Exercise per Session: 0 min  Stress: No Stress Concern Present (05/27/2021)   [place]    Feeling of Stress : Not at all  Social Connections: Moderately Isolated (05/27/2021)   Social  Connection and Isolation Panel [NHANES]    Frequency of Communication with Friends and Family: More than three times a week    Frequency of Social Gatherings with Friends and Family: Once a week    Attends Religious Services: Never    Marine scientist or Organizations: No    Attends Archivist Meetings: Never    Marital Status: Married  Human resources officer Violence: Not At Risk (05/27/2021)   Humiliation, Afraid, Rape, and Kick questionnaire    Fear of Current or Ex-Partner: No    Emotionally Abused: No    Physically Abused: No    Sexually Abused: No    Past Surgical History:  Procedure Laterality Date   ABDOMINAL HYSTERECTOMY     ANKLE FRACTURE SURGERY Left 2002   BILATERAL SALPINGOOPHORECTOMY  2000   CARDIAC CATHETERIZATION N/A 03/25/2015   Procedure: Right/Left Heart Cath and Coronary Angiography;  Surgeon: Belva Crome, MD;  Location: [place] [facility];  Service: Cardiovascular;  Laterality: N/A;   CESAREAN SECTION  1978   COLONOSCOPY WITH PROPOFOL N/A 09/22/2016   Procedure: COLONOSCOPY WITH PROPOFOL;  Surgeon: Manya Silvas, MD;  Location: [facility] ENDOSCOPY;  Service: Endoscopy;  Laterality: N/A;   EP IMPLANTABLE DEVICE N/A 08/05/2015   Procedure: Loop Recorder Insertion;  Surgeon: Deboraha Sprang, MD;  Location: [place] [facility];  Service: Cardiovascular;  Laterality: N/A;   ESOPHAGOGASTRODUODENOSCOPY (EGD) WITH PROPOFOL N/A 09/22/2016   Procedure: ESOPHAGOGASTRODUODENOSCOPY (EGD) WITH PROPOFOL;  Surgeon: Manya Silvas, MD;  Location: [facility] ENDOSCOPY;  Service: Endoscopy;  Laterality: N/A;   ESOPHAGOGASTRODUODENOSCOPY (EGD) WITH PROPOFOL N/A 12/08/2016   Procedure: ESOPHAGOGASTRODUODENOSCOPY (EGD) WITH PROPOFOL;  Surgeon: Manya Silvas, MD;  Location: [facility] ENDOSCOPY;  Service: Endoscopy;  Laterality: N/A;   KNEE ARTHROSCOPY Left 2005   TEE WITHOUT CARDIOVERSION N/A 01/31/2015   Procedure: TRANSESOPHAGEAL ECHOCARDIOGRAM (TEE);  Surgeon: Lelon Perla,  MD;  Location: [facility] ENDOSCOPY;  Service: Cardiovascular;  Laterality: N/A;   [place]  2008    Family History  Problem Relation Age of Onset   Heart disease Mother        died from CHF   Asthma Mother    Diabetes Mother    Heart disease Father    Aneurysm Father    COPD Brother    Diabetes Brother  Alcohol abuse Paternal Aunt    Anemia Neg Hx    Arrhythmia Neg Hx    Clotting disorder Neg Hx    Fainting Neg Hx    Heart attack Neg Hx    Heart failure Neg Hx    Hyperlipidemia Neg Hx    Hypertension Neg Hx     Allergies  Allergen Reactions   Codeine Nausea And Vomiting       Latest Ref Rng & Units 10/20/2020    3:54 PM 07/10/2020    3:56 PM 06/01/2020    4:40 AM  CBC  WBC 3.4 - 10.8 x10E3/uL 13.2  13.0  13.6   Hemoglobin 11.1 - 15.9 g/dL 12.8  13.3  12.7   Hematocrit 34.0 - 46.6 % 41.2  42.7  38.9   Platelets 150 - 450 x10E3/uL 415  435  384       CMP     Component Value Date/Time   NA 141 10/20/2020 1554   K 4.6 10/20/2020 1554   CL 103 10/20/2020 1554   CO2 23 10/20/2020 1554   GLUCOSE 205 (H) 10/20/2020 1554   GLUCOSE 141 (H) 06/01/2020 0440   BUN 37 (H) 10/20/2020 1554   CREATININE 2.62 (H) 10/20/2020 1554   CREATININE 1.37 (H) 03/21/2015 1451   CALCIUM 8.6 (L) 10/20/2020 1554   PROT 6.3 10/20/2020 1554   ALBUMIN 3.6 (L) 10/20/2020 1554   AST 8 10/20/2020 1554   ALT 9 10/20/2020 1554   ALKPHOS 98 10/20/2020 1554   BILITOT <0.2 10/20/2020 1554   GFRNONAA 31 (L) 06/01/2020 0440   GFRAA 33 (L) 04/23/2019 1446     VAS [place] ABI WITH/WO TBI  Result Date: 07/02/2021  LOWER EXTREMITY DOPPLER STUDY Patient Name:  Jocelyn Wells  Date of Exam:   07/01/2021 Medical Rec #: [id number]       Accession #:    [id number] Date of Birth: May 29, 1955       Patient Gender: F Patient Age:   26 years Exam Location:  [facility] Procedure:      VAS [place] ABI WITH/WO TBI Referring Phys: [name]  --------------------------------------------------------------------------------  Indications: Rest pain.  Performing Technologist: Almira Coaster RVS  Examination Guidelines: A complete evaluation includes at minimum, Doppler waveform signals and systolic blood pressure reading at the level of bilateral brachial, anterior tibial, and posterior tibial arteries, when vessel segments are accessible. Bilateral testing is considered an integral part of a complete examination. Photoelectric Plethysmograph (PPG) waveforms and toe systolic pressure readings are included as required and additional duplex testing as needed. Limited examinations for reoccurring indications may be performed as noted.  ABI Findings: +---------+------------------+-----+---------+--------+ Right    Rt Pressure (mmHg)IndexWaveform Comment  +---------+------------------+-----+---------+--------+ Brachial 179                                      +---------+------------------+-----+---------+--------+ ATA      164               0.92 biphasic          +---------+------------------+-----+---------+--------+ PTA      220               1.23 triphasic         +---------+------------------+-----+---------+--------+ Great Toe160               0.89 Normal            +---------+------------------+-----+---------+--------+ +---------+------------------+-----+--------+-------+  Left     Lt Pressure (mmHg)IndexWaveformComment +---------+------------------+-----+--------+-------+ Brachial 174                                    +---------+------------------+-----+--------+-------+ ATA      136               0.76 biphasic        +---------+------------------+-----+--------+-------+ PTA      121               0.68 biphasic        +---------+------------------+-----+--------+-------+ Great Toe107               0.60 Abnormal        +---------+------------------+-----+--------+-------+  +-------+-----------+-----------+------------+------------+ ABI/TBIToday's ABIToday's TBIPrevious ABIPrevious TBI +-------+-----------+-----------+------------+------------+ Right  1.23       .89                                 +-------+-----------+-----------+------------+------------+ Left   .76        .60                                 +-------+-----------+-----------+------------+------------+  Summary: Right: Resting right ankle-brachial index is within normal range. No evidence of significant right lower extremity arterial disease. The right toe-brachial index is normal. Left: Resting left ankle-brachial index indicates moderate left lower extremity arterial disease. The left toe-brachial index is abnormal.  *See table(s) above for measurements and observations.  Electronically signed by [name] MD on 07/02/2021 at 7:12:17 PM.    Final        Assessment & Plan:   1. Atherosclerosis of native arteries of the extremities with ulceration ([place])  Recommend:  The patient has evidence of severe atherosclerotic changes of both lower extremities associated with ulceration and tissue loss of the left foot.  This represents a limb threatening ischemia and places the patient at the risk for left limb loss.  Patient should undergo angiography of the left lower extremity with the hope for intervention for limb salvage.  The risks and benefits as well as the alternative therapies was discussed in detail with the patient.  All questions were answered.  Patient agrees to proceed with left angiography.  The patient will follow up with me in the office after the procedure.    2. Venous (peripheral) insufficiency We will remove the patient out of the wraps today.  The swelling is much better than previous.  She is advised to transition into medical grade compression stockings.  She is also advised to elevate her lower extremities and we will reevaluate her lower extremity edema when she  returns for postoperative follow-up.   Current Outpatient Medications on File Prior to Visit  Medication Sig Dispense Refill   albuterol (VENTOLIN HFA) 108 (90 Base) MCG/ACT inhaler TAKE 2 PUFFS BY MOUTH EVERY 6 HOURS AS NEEDED FOR WHEEZE OR SHORTNESS OF BREATH 8.5 each 2   apixaban (ELIQUIS) 5 MG TABS tablet Take 1 tablet (5 mg total) by mouth 2 (two) times daily. 180 tablet 3   atorvastatin (LIPITOR) 40 MG tablet TAKE 1 TABLET BY MOUTH EVERY DAY 90 tablet 2   B-D INS SYRINGE 0.5CC/31GX5/16 31G X 5/16" 0.5 ML MISC      carvedilol (COREG) 12.5 MG tablet Take 1 tablet (12.5 mg  total) by mouth 2 (two) times daily with a meal. 60 tablet 3   denosumab (PROLIA) 60 MG/ML SOSY injection Inject 60 mg into the skin every 6 (six) months.     diclofenac (VOLTAREN) 75 MG EC tablet Take 75 mg by mouth daily.     Dulaglutide 3 MG/0.5ML SOPN Inject into the skin once a week.     fluticasone (FLONASE) 50 MCG/ACT nasal spray Place 2 sprays into both nostrils daily. 16 g 6   gabapentin (NEURONTIN) 300 MG capsule Take 300 mg by mouth 3 (three) times daily.     glucose blood test strip OneTouch Verio strips     insulin aspart (NOVOLOG) 100 UNIT/ML injection Inject up to 100 units daily in insulin pump     Insulin Disposable Pump (OMNIPOD DASH PODS, GEN 4,) MISC USE 1 POD EACH EVERY 72    HOURS     Insulin Human (INSULIN PUMP) SOLN Per endocrinoloy     loratadine (CLARITIN) 10 MG tablet Take 1 tablet by mouth daily.     losartan (COZAAR) 50 MG tablet Take 1 tablet (50 mg total) by mouth daily. 30 tablet 3   ondansetron (ZOFRAN) 4 MG tablet Take 1 tablet (4 mg total) by mouth every 8 (eight) hours as needed for nausea or vomiting. 20 tablet 0   PARoxetine (PAXIL) 40 MG tablet Take 1 tablet (40 mg total) by mouth daily. 90 tablet 0   Spacer/Aero-Holding Chambers (OPTICHAMBER DIAMOND) MISC Use with inhaler to ensure medication is delivered throughout lungs 1 each 0   torsemide (DEMADEX) 20 MG tablet Take 1 tablet  (20 mg total) by mouth daily. 30 tablet 3   TRULICITY 3 SJ/6.2EZ SOPN Inject into the skin.     Vitamin D, Ergocalciferol, (DRISDOL) 1.25 MG (50000 UT) CAPS capsule Take 50,000 Units by mouth once a week. (Sundays)     hydrochlorothiazide (HYDRODIURIL) 25 MG tablet Take 25 mg by mouth daily. (Patient not taking: Reported on 08/05/2021)     No current facility-administered medications on file prior to visit.    There are no Patient Instructions on file for this visit. No follow-ups on file.   Kris Hartmann, NP

## 2021-08-12 ENCOUNTER — Inpatient Hospital Stay
Admission: RE | Admit: 2021-08-12 | Discharge: 2021-08-23 | DRG: 270 | Disposition: A | Discharge status: Home Health | Payer: Medicare Other | Attending: Internal Medicine | Admitting: Internal Medicine

## 2021-08-12 ENCOUNTER — Encounter: Payer: Self-pay | Admitting: Vascular Surgery

## 2021-08-12 ENCOUNTER — Encounter
Admission: RE | Disposition: A | Discharge status: Home Health | Payer: Self-pay | Source: Home / Self Care | Attending: Vascular Surgery

## 2021-08-12 ENCOUNTER — Other Ambulatory Visit: Payer: Self-pay

## 2021-08-12 DIAGNOSIS — E8770 Fluid overload, unspecified: Secondary | ICD-10-CM | POA: Diagnosis not present

## 2021-08-12 DIAGNOSIS — I5033 Acute on chronic diastolic (congestive) heart failure: Secondary | ICD-10-CM | POA: Diagnosis not present

## 2021-08-12 DIAGNOSIS — N184 Chronic kidney disease, stage 4 (severe): Secondary | ICD-10-CM | POA: Diagnosis present

## 2021-08-12 DIAGNOSIS — E11621 Type 2 diabetes mellitus with foot ulcer: Secondary | ICD-10-CM | POA: Diagnosis present

## 2021-08-12 DIAGNOSIS — F32A Depression, unspecified: Secondary | ICD-10-CM | POA: Diagnosis present

## 2021-08-12 DIAGNOSIS — E1122 Type 2 diabetes mellitus with diabetic chronic kidney disease: Secondary | ICD-10-CM | POA: Diagnosis present

## 2021-08-12 DIAGNOSIS — Z86718 Personal history of other venous thrombosis and embolism: Secondary | ICD-10-CM | POA: Diagnosis not present

## 2021-08-12 DIAGNOSIS — N186 End stage renal disease: Secondary | ICD-10-CM

## 2021-08-12 DIAGNOSIS — Z8249 Family history of ischemic heart disease and other diseases of the circulatory system: Secondary | ICD-10-CM

## 2021-08-12 DIAGNOSIS — I480 Paroxysmal atrial fibrillation: Secondary | ICD-10-CM | POA: Diagnosis present

## 2021-08-12 DIAGNOSIS — E1151 Type 2 diabetes mellitus with diabetic peripheral angiopathy without gangrene: Principal | ICD-10-CM | POA: Diagnosis present

## 2021-08-12 DIAGNOSIS — I70299 Other atherosclerosis of native arteries of extremities, unspecified extremity: Secondary | ICD-10-CM

## 2021-08-12 DIAGNOSIS — I70245 Atherosclerosis of native arteries of left leg with ulceration of other part of foot: Secondary | ICD-10-CM | POA: Diagnosis present

## 2021-08-12 DIAGNOSIS — L97929 Non-pressure chronic ulcer of unspecified part of left lower leg with unspecified severity: Secondary | ICD-10-CM | POA: Diagnosis not present

## 2021-08-12 DIAGNOSIS — D631 Anemia in chronic kidney disease: Secondary | ICD-10-CM | POA: Diagnosis present

## 2021-08-12 DIAGNOSIS — R1110 Vomiting, unspecified: Secondary | ICD-10-CM | POA: Diagnosis not present

## 2021-08-12 DIAGNOSIS — Z87891 Personal history of nicotine dependence: Secondary | ICD-10-CM

## 2021-08-12 DIAGNOSIS — N179 Acute kidney failure, unspecified: Secondary | ICD-10-CM

## 2021-08-12 DIAGNOSIS — Z6838 Body mass index (BMI) 38.0-38.9, adult: Secondary | ICD-10-CM

## 2021-08-12 DIAGNOSIS — Z833 Family history of diabetes mellitus: Secondary | ICD-10-CM

## 2021-08-12 DIAGNOSIS — Z885 Allergy status to narcotic agent status: Secondary | ICD-10-CM | POA: Diagnosis not present

## 2021-08-12 DIAGNOSIS — J449 Chronic obstructive pulmonary disease, unspecified: Secondary | ICD-10-CM | POA: Diagnosis present

## 2021-08-12 DIAGNOSIS — I872 Venous insufficiency (chronic) (peripheral): Secondary | ICD-10-CM | POA: Diagnosis not present

## 2021-08-12 DIAGNOSIS — Z79899 Other long term (current) drug therapy: Secondary | ICD-10-CM

## 2021-08-12 DIAGNOSIS — I130 Hypertensive heart and chronic kidney disease with heart failure and stage 1 through stage 4 chronic kidney disease, or unspecified chronic kidney disease: Secondary | ICD-10-CM | POA: Diagnosis present

## 2021-08-12 DIAGNOSIS — R0602 Shortness of breath: Secondary | ICD-10-CM | POA: Diagnosis not present

## 2021-08-12 DIAGNOSIS — I7025 Atherosclerosis of native arteries of other extremities with ulceration: Secondary | ICD-10-CM | POA: Diagnosis not present

## 2021-08-12 DIAGNOSIS — I429 Cardiomyopathy, unspecified: Secondary | ICD-10-CM | POA: Diagnosis present

## 2021-08-12 DIAGNOSIS — N2581 Secondary hyperparathyroidism of renal origin: Secondary | ICD-10-CM | POA: Diagnosis present

## 2021-08-12 DIAGNOSIS — Z7985 Long-term (current) use of injectable non-insulin antidiabetic drugs: Secondary | ICD-10-CM

## 2021-08-12 DIAGNOSIS — Z794 Long term (current) use of insulin: Secondary | ICD-10-CM

## 2021-08-12 DIAGNOSIS — J9601 Acute respiratory failure with hypoxia: Secondary | ICD-10-CM | POA: Diagnosis not present

## 2021-08-12 DIAGNOSIS — E1129 Type 2 diabetes mellitus with other diabetic kidney complication: Secondary | ICD-10-CM | POA: Diagnosis present

## 2021-08-12 DIAGNOSIS — K920 Hematemesis: Secondary | ICD-10-CM | POA: Diagnosis not present

## 2021-08-12 DIAGNOSIS — J9621 Acute and chronic respiratory failure with hypoxia: Secondary | ICD-10-CM | POA: Diagnosis present

## 2021-08-12 DIAGNOSIS — N189 Chronic kidney disease, unspecified: Secondary | ICD-10-CM | POA: Diagnosis not present

## 2021-08-12 DIAGNOSIS — E875 Hyperkalemia: Secondary | ICD-10-CM

## 2021-08-12 DIAGNOSIS — I70249 Atherosclerosis of native arteries of left leg with ulceration of unspecified site: Secondary | ICD-10-CM | POA: Diagnosis not present

## 2021-08-12 DIAGNOSIS — K625 Hemorrhage of anus and rectum: Secondary | ICD-10-CM | POA: Diagnosis not present

## 2021-08-12 DIAGNOSIS — N170 Acute kidney failure with tubular necrosis: Secondary | ICD-10-CM | POA: Diagnosis not present

## 2021-08-12 DIAGNOSIS — I743 Embolism and thrombosis of arteries of the lower extremities: Secondary | ICD-10-CM | POA: Diagnosis not present

## 2021-08-12 DIAGNOSIS — Z992 Dependence on renal dialysis: Secondary | ICD-10-CM | POA: Diagnosis not present

## 2021-08-12 DIAGNOSIS — I129 Hypertensive chronic kidney disease with stage 1 through stage 4 chronic kidney disease, or unspecified chronic kidney disease: Secondary | ICD-10-CM | POA: Diagnosis not present

## 2021-08-12 DIAGNOSIS — M810 Age-related osteoporosis without current pathological fracture: Secondary | ICD-10-CM | POA: Diagnosis present

## 2021-08-12 DIAGNOSIS — I701 Atherosclerosis of renal artery: Secondary | ICD-10-CM | POA: Diagnosis not present

## 2021-08-12 DIAGNOSIS — Z825 Family history of asthma and other chronic lower respiratory diseases: Secondary | ICD-10-CM

## 2021-08-12 DIAGNOSIS — Z8673 Personal history of transient ischemic attack (TIA), and cerebral infarction without residual deficits: Secondary | ICD-10-CM | POA: Diagnosis not present

## 2021-08-12 DIAGNOSIS — K922 Gastrointestinal hemorrhage, unspecified: Secondary | ICD-10-CM | POA: Diagnosis present

## 2021-08-12 DIAGNOSIS — I4510 Unspecified right bundle-branch block: Secondary | ICD-10-CM | POA: Diagnosis not present

## 2021-08-12 DIAGNOSIS — Z7901 Long term (current) use of anticoagulants: Secondary | ICD-10-CM

## 2021-08-12 DIAGNOSIS — D649 Anemia, unspecified: Secondary | ICD-10-CM | POA: Diagnosis not present

## 2021-08-12 HISTORY — PX: LOWER EXTREMITY ANGIOGRAPHY: CATH118251

## 2021-08-12 LAB — GLUCOSE, CAPILLARY
Glucose-Capillary: 118 mg/dL — ABNORMAL HIGH (ref 70–99)
Glucose-Capillary: 180 mg/dL — ABNORMAL HIGH (ref 70–99)
Glucose-Capillary: 202 mg/dL — ABNORMAL HIGH (ref 70–99)
Glucose-Capillary: 174 mg/dL — ABNORMAL HIGH (ref 70–99)

## 2021-08-12 LAB — CREATININE, SERUM
Creatinine, Ser: 3.85 mg/dL — ABNORMAL HIGH (ref 0.44–1.00)
GFR, Estimated: 12 mL/min — ABNORMAL LOW (ref >60)

## 2021-08-12 LAB — BUN: BUN: 53 mg/dL — ABNORMAL HIGH (ref 8–23)

## 2021-08-12 SURGERY — LOWER EXTREMITY ANGIOGRAPHY
Anesthesia: Moderate Sedation | Site: Leg Lower | Laterality: Left

## 2021-08-12 MED ORDER — FENTANYL CITRATE (PF) 100 MCG/2ML IJ SOLN
INTRAMUSCULAR | Status: DC | PRN
  Administered 2021-08-12: 50 ug via INTRAVENOUS
  Administered 2021-08-12: 25 ug via INTRAVENOUS

## 2021-08-12 MED ORDER — HYDROCHLOROTHIAZIDE 25 MG PO TABS
25.0000 mg | ORAL_TABLET | Freq: Every day | ORAL | Status: DC
  Administered 2021-08-13: 25 mg via ORAL
  Filled 2021-08-12: qty 1

## 2021-08-12 MED ORDER — LOSARTAN POTASSIUM 50 MG PO TABS
50.0000 mg | ORAL_TABLET | Freq: Every day | ORAL | Status: DC
  Administered 2021-08-12 – 2021-08-13 (×2): 50 mg via ORAL
  Filled 2021-08-12 (×2): qty 1

## 2021-08-12 MED ORDER — SODIUM CHLORIDE 0.9% FLUSH
3.0000 mL | Freq: Two times a day (BID) | INTRAVENOUS | Status: DC
  Administered 2021-08-13 – 2021-08-23 (×20): 3 mL via INTRAVENOUS

## 2021-08-12 MED ORDER — HEPARIN (PORCINE) 25000 UT/250ML-% IV SOLN
1400.0000 [IU]/h | INTRAVENOUS | Status: DC
  Administered 2021-08-13: 1400 [IU]/h via INTRAVENOUS
  Filled 2021-08-12: qty 250

## 2021-08-12 MED ORDER — SODIUM CHLORIDE 0.9% FLUSH: 3.0000 mL | INTRAVENOUS | Status: DC | PRN

## 2021-08-12 MED ORDER — SODIUM CHLORIDE 0.9 % IV SOLN: 250.0000 mL | INTRAVENOUS | Status: DC | PRN

## 2021-08-12 MED ORDER — HEPARIN SODIUM (PORCINE) 1000 UNIT/ML IJ SOLN
INTRAMUSCULAR | Status: AC
  Filled 2021-08-12: qty 10

## 2021-08-12 MED ORDER — FENTANYL CITRATE PF 50 MCG/ML IJ SOSY
PREFILLED_SYRINGE | INTRAMUSCULAR | Status: AC
  Filled 2021-08-12: qty 2

## 2021-08-12 MED ORDER — TIROFIBAN HCL IV 12.5 MG/250 ML
INTRAVENOUS | Status: AC
  Administered 2021-08-12: 2687.5 ug via INTRAVENOUS
  Filled 2021-08-12: qty 250

## 2021-08-12 MED ORDER — DIPHENHYDRAMINE HCL 50 MG/ML IJ SOLN: 50.0000 mg | Freq: Once | INTRAMUSCULAR | Status: DC | PRN

## 2021-08-12 MED ORDER — INSULIN ASPART 100 UNIT/ML IJ SOLN
0.0000 [IU] | Freq: Three times a day (TID) | INTRAMUSCULAR | Status: DC
  Administered 2021-08-12 – 2021-08-13 (×4): 5 [IU] via SUBCUTANEOUS
  Filled 2021-08-12 (×4): qty 1

## 2021-08-12 MED ORDER — MIDAZOLAM HCL 5 MG/5ML IJ SOLN
INTRAMUSCULAR | Status: AC
  Filled 2021-08-12: qty 5

## 2021-08-12 MED ORDER — TIROFIBAN (AGGRASTAT) BOLUS VIA INFUSION
25.0000 ug/kg | Freq: Once | INTRAVENOUS | Status: AC
  Filled 2021-08-12: qty 54

## 2021-08-12 MED ORDER — ACETAMINOPHEN 325 MG PO TABS: 650.0000 mg | ORAL_TABLET | ORAL | Status: DC | PRN

## 2021-08-12 MED ORDER — MIDAZOLAM HCL 2 MG/ML PO SYRP: 8.0000 mg | ORAL_SOLUTION | Freq: Once | ORAL | Status: DC | PRN

## 2021-08-12 MED ORDER — CARVEDILOL 12.5 MG PO TABS
12.5000 mg | ORAL_TABLET | Freq: Two times a day (BID) | ORAL | Status: DC
  Administered 2021-08-12 – 2021-08-13 (×3): 12.5 mg via ORAL
  Filled 2021-08-12 (×3): qty 1

## 2021-08-12 MED ORDER — MIDAZOLAM HCL 2 MG/2ML IJ SOLN
INTRAMUSCULAR | Status: DC | PRN
  Administered 2021-08-12 (×3): 1 mg via INTRAVENOUS

## 2021-08-12 MED ORDER — OXYCODONE HCL 5 MG PO TABS
ORAL_TABLET | ORAL | Status: AC
  Administered 2021-08-12: 10 mg via ORAL
  Filled 2021-08-12: qty 2

## 2021-08-12 MED ORDER — METHYLPREDNISOLONE SODIUM SUCC 125 MG IJ SOLR: 125.0000 mg | Freq: Once | INTRAMUSCULAR | Status: DC | PRN

## 2021-08-12 MED ORDER — SODIUM CHLORIDE 0.9 % IV SOLN
INTRAVENOUS | Status: DC
Start: 2021-08-12 — End: 2021-08-12

## 2021-08-12 MED ORDER — SODIUM CHLORIDE 0.9 % IV SOLN: INTRAVENOUS | Status: AC

## 2021-08-12 MED ORDER — FAMOTIDINE 20 MG PO TABS: 40.0000 mg | ORAL_TABLET | Freq: Once | ORAL | Status: DC | PRN

## 2021-08-12 MED ORDER — OXYCODONE HCL 5 MG PO TABS
5.0000 mg | ORAL_TABLET | ORAL | Status: DC | PRN
  Administered 2021-08-14: 5 mg via ORAL
  Filled 2021-08-12: qty 1

## 2021-08-12 MED ORDER — HEPARIN SODIUM (PORCINE) 1000 UNIT/ML IJ SOLN
INTRAMUSCULAR | Status: DC | PRN
  Administered 2021-08-12: 5000 [IU] via INTRAVENOUS

## 2021-08-12 MED ORDER — ONDANSETRON HCL 4 MG/2ML IJ SOLN: 4.0000 mg | Freq: Four times a day (QID) | INTRAMUSCULAR | Status: DC | PRN

## 2021-08-12 MED ORDER — PAROXETINE HCL 20 MG PO TABS
40.0000 mg | ORAL_TABLET | Freq: Every day | ORAL | Status: DC
  Administered 2021-08-12 – 2021-08-23 (×11): 40 mg via ORAL
  Filled 2021-08-12 (×12): qty 2

## 2021-08-12 MED ORDER — MORPHINE SULFATE (PF) 4 MG/ML IV SOLN: 2.0000 mg | INTRAVENOUS | Status: DC | PRN

## 2021-08-12 MED ORDER — ONDANSETRON HCL 4 MG/2ML IJ SOLN
4.0000 mg | Freq: Four times a day (QID) | INTRAMUSCULAR | Status: DC | PRN
  Administered 2021-08-12 – 2021-08-14 (×4): 4 mg via INTRAVENOUS
  Filled 2021-08-12 (×5): qty 2

## 2021-08-12 MED ORDER — NITROGLYCERIN 1 MG/10 ML FOR IR/CATH LAB
INTRA_ARTERIAL | Status: DC | PRN
  Administered 2021-08-12 (×2): 500 ug

## 2021-08-12 MED ORDER — HYDROMORPHONE HCL 1 MG/ML IJ SOLN: 1.0000 mg | Freq: Once | INTRAMUSCULAR | Status: DC | PRN

## 2021-08-12 MED ORDER — IODIXANOL 320 MG/ML IV SOLN
INTRAVENOUS | Status: DC | PRN
  Administered 2021-08-12: 90 mL

## 2021-08-12 MED ORDER — ATORVASTATIN CALCIUM 20 MG PO TABS
40.0000 mg | ORAL_TABLET | Freq: Every day | ORAL | Status: DC
  Administered 2021-08-12 – 2021-08-23 (×11): 40 mg via ORAL
  Filled 2021-08-12 (×12): qty 2

## 2021-08-12 MED ORDER — CEFAZOLIN SODIUM-DEXTROSE 2-4 GM/100ML-% IV SOLN
2.0000 g | INTRAVENOUS | Status: AC
  Administered 2021-08-12: 2 g via INTRAVENOUS

## 2021-08-12 MED ORDER — TIROFIBAN HCL IN NACL 5-0.9 MG/100ML-% IV SOLN
0.0750 ug/kg/min | INTRAVENOUS | Status: AC
  Administered 2021-08-12: 0.075 ug/kg/min via INTRAVENOUS
  Filled 2021-08-12 (×2): qty 100

## 2021-08-12 SURGICAL SUPPLY — 41 items
BALLN LUTONIX 018 4X220X130 (BALLOONS) ×2
BALLN LUTONIX 018 4X300X130 (BALLOONS) ×2
BALLN LUTONIX 018 4X40X130 (BALLOONS) ×2
BALLN LUTONIX 4X120X130 (BALLOONS) ×2
BALLN LUTONIX 5X220X130 (BALLOONS) ×2
BALLN LUTONIX AV 8X40X75 (BALLOONS) ×2
BALLN ULTRASCORE 014 3X40X150 (BALLOONS) ×2
BALLN ULTRVRSE 3X40X150 (BALLOONS)
BALLOON LUTONIX 018 4X220X130 (BALLOONS) IMPLANT
BALLOON LUTONIX 018 4X300X130 (BALLOONS) IMPLANT
BALLOON LUTONIX 018 4X40X130 (BALLOONS) IMPLANT
BALLOON LUTONIX 4X120X130 (BALLOONS) IMPLANT
BALLOON LUTONIX 5X220X130 (BALLOONS) IMPLANT
BALLOON LUTONIX AV 8X40X75 (BALLOONS) IMPLANT
BALLOON ULTRSCRE 014 3X40X150 (BALLOONS) IMPLANT
BALLOON ULTRVRSE 3X40X150 (BALLOONS) IMPLANT
CATH ANGIO 5F PIGTAIL 65CM (CATHETERS) ×1 IMPLANT
CATH BEACON 5 .035 65 KMP TIP (CATHETERS) ×1 IMPLANT
CATH BEACON 5 .038 100 VERT TP (CATHETERS) ×1 IMPLANT
CATH EXTRAC PRONTO LP 6F RND (CATHETERS) ×1 IMPLANT
COVER PROBE U/S 5X48 (MISCELLANEOUS) ×1 IMPLANT
DEVICE STARCLOSE SE CLOSURE (Vascular Products) ×1 IMPLANT
DEVICE TORQUE .025-.038 (MISCELLANEOUS) ×1 IMPLANT
GLIDEWIRE ADV .035X260CM (WIRE) ×1 IMPLANT
GLIDEWIRE ANGLED SS 035X260CM (WIRE) ×1 IMPLANT
GUIDEWIRE PFTE-COATED .018X300 (WIRE) ×1 IMPLANT
KIT ENCORE 26 ADVANTAGE (KITS) ×1 IMPLANT
LIFESTENT SOLO 6X200X135 (Permanent Stent) ×1 IMPLANT
NDL ENTRY 21GA 7CM ECHOTIP (NEEDLE) IMPLANT
NEEDLE ENTRY 21GA 7CM ECHOTIP (NEEDLE) ×2 IMPLANT
PACK ANGIOGRAPHY (CUSTOM PROCEDURE TRAY) ×2 IMPLANT
SET INTRO CAPELLA COAXIAL (SET/KITS/TRAYS/PACK) ×1 IMPLANT
SHEATH ANL2 6FRX45 HC (SHEATH) ×1 IMPLANT
SHEATH BRITE TIP 5FRX11 (SHEATH) ×1 IMPLANT
SHEATH RAABE 6FRX70 (SHEATH) ×1 IMPLANT
STENT LIFESTENT 6X170X130 (Permanent Stent) ×1 IMPLANT
SYR MEDRAD MARK 7 150ML (SYRINGE) ×1 IMPLANT
TUBING CONTRAST HIGH PRESS 72 (TUBING) ×1 IMPLANT
WIRE GUIDERIGHT .035X150 (WIRE) ×1 IMPLANT
WIRE HI TORQ VERSACORE 300 (WIRE) ×1 IMPLANT
WIRE RUNTHROUGH .014X300CM (WIRE) ×1 IMPLANT

## 2021-08-12 NOTE — Progress Notes (Signed)
Noted small amount of blood oozing from right femoral site when she arrived on unit.   Cleaned area surrounding the PAD dressing.  Will continue to monitor

## 2021-08-12 NOTE — Op Note (Signed)
[facility]  Percutaneous Study/Intervention Procedural Note   Date of Surgery: 08/12/2021  Surgeon:  Katha Cabal, MD.  Pre-operative Diagnosis: Atherosclerotic occlusive disease bilateral lower extremities with ulceration of the left lower extremity  Post-operative diagnosis:  Same  Procedure(s) Performed:             1.  Introduction catheter into left lower extremity 3rd order catheter placement              2.    Contrast injection left lower extremity for distal runoff             3.  Percutaneous transluminal angioplasty and stent placement left superficial femoral artery and popliteal to 5 mm proximally and 4 mm distally.             4.  Percutaneous transluminal angioplasty of the left common iliac artery to 9 mm.               5.  Percutaneous transluminal angioplasty of the left peroneal artery to 3 mm.               6.  Mechanical thrombectomy left tibioperoneal trunk and peroneal.              7.   Star close closure right common femoral arteriotomy  Anesthesia: Conscious sedation was administered under my direct supervision by the interventional radiology RN. IV Versed plus fentanyl were utilized. Continuous ECG, pulse oximetry and blood pressure was monitored throughout the entire procedure.  Conscious sedation was for a total of 103 minutes.  Sheath: 6 [place] Rabie 70 cm sheath right common femoral retrograde  Contrast: 90 cc  Fluoroscopy Time: 18.5 minutes  Indications:  Jocelyn Wells presents with worsening of her ulceration of the left lower extremity.  Noninvasive studies as well as physical examination are consistent with moderate to severe atherosclerotic occlusive disease in association with venous stasis disease.  Given this finding and the worsening of her wounds and spite optimal wound management angiography with hope for intervention has been recommended.  This clearly places the patient at risk for limb loss.  The risks and  benefits are reviewed all questions answered patient agrees to proceed.  Procedure:  Jocelyn Wells is a 66 y.o. y.o. female who was identified and appropriate procedural time out was performed.  The patient was then placed supine on the table and prepped and draped in the usual sterile fashion.    Ultrasound was placed in the sterile sleeve and the right groin was evaluated the right common femoral artery was echolucent and pulsatile indicating patency.  Image was recorded for the permanent record and under real-time visualization a microneedle was inserted into the common femoral artery microwire followed by a micro-sheath.  A J-wire was then advanced through the micro-sheath and a  5 [place] sheath was then inserted over a J-wire. J-wire was then advanced and a 5 French pigtail catheter was positioned at the level of T12. AP projection of the aorta was then obtained. Pigtail catheter was repositioned to above the bifurcation and a bilateral oblique views of the pelvis was obtained.  Subsequently a pigtail catheter with the stiff angle Glidewire was used to cross the aortic bifurcation the catheter wire were advanced down into the left distal external iliac artery. Oblique view of the femoral bifurcation was then obtained and subsequently the wire was reintroduced and the pigtail catheter negotiated into the profunda femoris representing third order catheter placement.  Distal runoff was then performed.  5000 units of heparin was then given and allowed to circulate and a 6 [place] Ansell sheath was advanced up and over the bifurcation and positioned in the mid left common femoral artery.  Later in the case the sheath was exchanged for a 70 cm Rabie sheath.  With the [facility] sheath positioned at the aortic bifurcation magnified bilateral oblique imaging of the aortic bifurcation and proximal common iliac arteries was obtained.  This confirmed the 70% stenosis in the proximal left common iliac artery.  After  measurements were made a 9 mm x 40 mm Lutonix drug-eluting eluding balloon was advanced across this lesion inflated to 10 atm for 1 minute.  Follow-up imaging demonstrated less than 10% residual stenosis.  The dilator was then re-inserted and the sheath advanced up and over as described above and positioned in the mid common femoral.  KMP  catheter and a combination of both stiff angle Glidewire and advantage wires were then negotiated through the occlusion down into the distal popliteal.  Distal imaging was then completed by hand injection through the catheter verifying intraluminal placement. The wire was then reintroduced and a 4 mm x 300 mm Lutonix drug-eluting balloon was used to angioplasty the superficial femoral and popliteal arteries.  2 inflations were needed.  Inflations were to 10 atmospheres for 1 minute.  Follow-up imaging demonstrated greater than 60% residual stenosis and a 6 x 200 life stent was deployed beginning distally and moving proximally.  A second life stent which was a 6 x 170 mm life stent was deployed.  Distally the stent was postdilated with a 4 mm Lutonix drug-eluting balloon and proximally the stent was postdilated with a 5 mm Lutonix drug-eluting balloon.  Follow-up imaging demonstrated that the SFA and popliteal were now widely patent but there was very sluggish flow suggestive of an outflow problem.  A 500 mcg dose of nitroglycerin was then given and later in the case a second 500 mcg dose of nitroglycerin was given intra-arterially.  This provided some improvement but there was still abnormally sluggish flow.  Imaging more distally demonstrated the previously noted 90% lesion at the origin of the peroneal and an 014 wire was negotiated through this lesion.  A 3 mm x 40 mm ultra score balloon was then advanced across this lesion inflated to 10 atm for 1 minute.  Follow-up imaging demonstrated less than 10% residual stenosis but now clearly identified was a lesion in the distal  popliteal.  I advanced a 4 mm x 40 mm balloon across this lesion inflated to 6 atm for 1 minute.  Follow-up imaging now demonstrated total resolution of this lesion but thrombus within the proximal peroneal.  The fetch device was then prepped on the field and used to treat this thrombus in the peroneal.  A total of 3 passes were made.  Follow-up imaging now demonstrated the peroneal was widely patent.  There is also now near normal flow by injection from the sheath in the proximal SFA down to the ankle.  Anterior tibial remains the dominant runoff to the foot  After review of these images the sheath is pulled into the right external iliac oblique of the common femoral is obtained and a Star close device deployed.  Given the formation of the thrombus during the procedure I will place the patient on Aggrastat overnight.  She will begin dual antiplatelet therapy tomorrow morning  Findings:  The abdominal aorta is opacified with a bolus injection contrast. Renal  arteries are noted to have greater than 60% stenosis bilaterally although there is significant motion artifact. The aorta itself has diffuse disease but no hemodynamically significant lesions. The common and external iliac arteries are widely patent on the right, on the left there appears to be a greater than 70% stenosis in the proximal common iliac.  There is moderate poststenotic dilatation and then the distal common iliac is widely patent.  The internal and external iliac arteries on the left are widely patent although diffusely diseased..  The left common femoral is widely patent as is the profunda femoris.  The SFA occludes several millimeters distal to its origin.  The above-knee popliteal is reconstituted by large profunda collaterals but demonstrates increasing disease with 2 distinct hemodynamically significant lesions of greater than 70% in the mid and more distal segments.  The trifurcation is heavily diseased with occlusion of the posterior  tibial artery and a greater than 90% stenosis of the proximal peroneal artery.  The anterior tibial artery is widely patent filling the dorsalis pedis and the pedal arch.  Following angioplasty of the common iliac artery lesion with a 9 mm Lutonix drug-eluting balloon there is less than 10% residual stenosis.  After achieving successful crossing of the SFA and popliteal angioplasty yields a result with greater than 60% residual stenosis literally throughout the entire length of the previous occlusion.  Following stent placement and then post stent dilatation with a 5 mm balloon proximally and a 4 mm balloon distally there is now wide patency of the SFA and popliteal with less than 10% residual stenosis.  Following angioplasty of the distal tibioperoneal trunk and the peroneal artery there is now less than 10% residual stenosis.  Following treatment of the tibioperoneal trunk lesion a greater than 80% lesion is present in the distal popliteal this was treated with a 4 mm balloon inflated to 4 atm for 1 minute.  Follow-up imaging demonstrates thrombus within the peroneal and this is extracted using the fetch device with elimination of this material and wide patency of the peroneal.   Summary: Successful recanalization left lower extremity for limb salvage                        Disposition: Patient was taken to the recovery room in stable condition having tolerated the procedure well.  Jocelyn Wells, Dolores Lory 08/12/2021,1:58 PM

## 2021-08-12 NOTE — Progress Notes (Signed)
Patient called out, vomited once after eating.   Assessed femoral site and half the dressing was saturated with blood.  Called down to special, Cath lab RN came up removed old dressing, applied pressure to site while changing bedding/gown and applied a new PAD dressing.     Notified Dr. Delana Meyer to make him aware.

## 2021-08-12 NOTE — Consult Note (Signed)
ANTICOAGULATION CONSULT NOTE - Initial Consult  Pharmacy Consult for heparin infusion Indication:  BLLE peripheral occlusive   Allergies  Allergen Reactions   Codeine Nausea And Vomiting    Patient Measurements:   Heparin Dosing Weight: 101 kg   Vital Signs: Temp: 98.2 F (36.8 C) (07/05 1045) Temp Source: Oral (07/05 1045) BP: 156/74 (07/05 1530) Pulse Rate: 74 (07/05 1530)  Labs: Recent Labs    08/12/21 1059  CREATININE 3.85*    Estimated Creatinine Clearance: 17.6 mL/min (A) (by C-G formula based on SCr of 3.85 mg/dL (H)).   Medical History: Past Medical History:  Diagnosis Date   Anxiety    Cardiomyopathy [facility])    new to her Jan 2017   Chronic kidney disease    COPD (chronic obstructive pulmonary disease) ([place])    Depression    Diabetes mellitus, type II ([place])    HTN (hypertension)    Obesity    Osteoporosis    PONV (postoperative nausea and vomiting)    Stroke [facility])    Jan 2017    Medications:  Tirofiban @ 0.075 mcg/kg/min - to end 08/13/21 @ 0600  Assessment: 66 year old female presented to [facility] 08/12/21 for LLE angiography for occlusive PAD. S/p stent placement on 08/12/21. Pharmacy has been consulted to transition to heparin infusion from Tirofiban  Goal of Therapy:  Heparin level 0.3-0.7 units/ml Monitor platelets by anticoagulation protocol: Yes   Plan:  Tirofiban scheduled to end 08/13/21 at 0600 Initiate heparin infusion at 1400 units/hr 08/13/21 following Tirofiban stoppage Heparin level 8 hours following heparin initiation Check CBC tomorrow AM  Dorothe Pea, PharmD, BCPS Clinical Pharmacist   08/12/2021,4:01 PM

## 2021-08-12 NOTE — Interval H&P Note (Signed)
History and Physical Interval Note:  08/12/2021 10:59 AM  Jocelyn Wells  has presented today for surgery, with the diagnosis of LLE Angio   BARD    ASO w ulceration.  The various methods of treatment have been discussed with the patient and family. After consideration of risks, benefits and other options for treatment, the patient has consented to  Procedure(s): Lower Extremity Angiography (Left) as a surgical intervention.  The patient's history has been reviewed, patient examined, no change in status, stable for surgery.  I have reviewed the patient's chart and labs.  Questions were answered to the patient's satisfaction.     [name]

## 2021-08-13 ENCOUNTER — Encounter: Payer: Self-pay | Admitting: Vascular Surgery

## 2021-08-13 ENCOUNTER — Inpatient Hospital Stay: Payer: Medicare Other

## 2021-08-13 LAB — CBC
HCT: 41 % (ref 36.0–46.0)
Hemoglobin: 11.8 g/dL — ABNORMAL LOW (ref 12.0–15.0)
MCH: 24.7 pg — ABNORMAL LOW (ref 26.0–34.0)
MCHC: 28.8 g/dL — ABNORMAL LOW (ref 30.0–36.0)
MCV: 85.8 fL (ref 80.0–100.0)
Platelets: 477 10*3/uL — ABNORMAL HIGH (ref 150–400)
RBC: 4.78 MIL/uL (ref 3.87–5.11)
RDW: 15.9 % — ABNORMAL HIGH (ref 11.5–15.5)
WBC: 14.9 10*3/uL — ABNORMAL HIGH (ref 4.0–10.5)
nRBC: 0 % (ref 0.0–0.2)

## 2021-08-13 LAB — BASIC METABOLIC PANEL
Anion gap: 8 (ref 5–15)
BUN: 57 mg/dL — ABNORMAL HIGH (ref 8–23)
CO2: 29 mmol/L (ref 22–32)
Calcium: 8.4 mg/dL — ABNORMAL LOW (ref 8.9–10.3)
Chloride: 100 mmol/L (ref 98–111)
Creatinine, Ser: 4.07 mg/dL — ABNORMAL HIGH (ref 0.44–1.00)
GFR, Estimated: 11 mL/min — ABNORMAL LOW (ref >60)
Glucose, Bld: 202 mg/dL — ABNORMAL HIGH (ref 70–99)
Potassium: 5.5 mmol/L — ABNORMAL HIGH (ref 3.5–5.1)
Sodium: 137 mmol/L (ref 135–145)

## 2021-08-13 LAB — HEPARIN LEVEL (UNFRACTIONATED): Heparin Unfractionated: 0.35 IU/mL (ref 0.30–0.70)

## 2021-08-13 LAB — GLUCOSE, CAPILLARY
Glucose-Capillary: 236 mg/dL — ABNORMAL HIGH (ref 70–99)
Glucose-Capillary: 243 mg/dL — ABNORMAL HIGH (ref 70–99)
Glucose-Capillary: 208 mg/dL — ABNORMAL HIGH (ref 70–99)
Glucose-Capillary: 240 mg/dL — ABNORMAL HIGH (ref 70–99)

## 2021-08-13 MED ORDER — APIXABAN 5 MG PO TABS
5.0000 mg | ORAL_TABLET | Freq: Two times a day (BID) | ORAL | Status: DC
  Administered 2021-08-13 (×2): 5 mg via ORAL
  Filled 2021-08-13 (×2): qty 1

## 2021-08-13 MED ORDER — HEPARIN (PORCINE) 25000 UT/250ML-% IV SOLN: 1400.0000 [IU]/h | INTRAVENOUS | Status: DC

## 2021-08-13 MED ORDER — ASPIRIN 325 MG PO TBEC
325.0000 mg | DELAYED_RELEASE_TABLET | Freq: Once | ORAL | Status: AC
Start: 2021-08-13 — End: 2021-08-13
  Administered 2021-08-13: 325 mg via ORAL
  Filled 2021-08-13: qty 1

## 2021-08-13 MED ORDER — ALBUTEROL SULFATE (2.5 MG/3ML) 0.083% IN NEBU: 2.5000 mg | INHALATION_SOLUTION | RESPIRATORY_TRACT | Status: DC | PRN

## 2021-08-13 MED ORDER — POTASSIUM CHLORIDE CRYS ER 20 MEQ PO TBCR
40.0000 meq | EXTENDED_RELEASE_TABLET | Freq: Two times a day (BID) | ORAL | Status: DC
  Filled 2021-08-13: qty 2

## 2021-08-13 MED ORDER — ASPIRIN 81 MG PO TBEC: 81.0000 mg | DELAYED_RELEASE_TABLET | Freq: Every day | ORAL | Status: DC

## 2021-08-13 MED ORDER — SODIUM CHLORIDE 0.9 % IV SOLN: INTRAVENOUS | Status: DC

## 2021-08-13 MED ORDER — FUROSEMIDE 10 MG/ML IJ SOLN
20.0000 mg | Freq: Once | INTRAMUSCULAR | Status: AC
  Administered 2021-08-13: 20 mg via INTRAVENOUS
  Filled 2021-08-13: qty 2

## 2021-08-13 NOTE — Progress Notes (Signed)
[facility]  Daily Progress Note   Subjective  -   Patient has a very decreased appetite today.  Has had several times and is forgetful and repeats self multiple times.  She has had issues with O2 saturation despite being on 2 L nasal cannula.  Patient denies any post angiogram related issues. Objective Vitals:   08/13/21 1115 08/13/21 1158 08/13/21 1650 08/13/21 1947  BP: (!) 127/50 130/67 130/67 (!) 136/55  Pulse: 73 75 73 76  Resp: '18  18 18  '$ Temp: (!) 97.5 F (36.4 C) 98.4 F (36.9 C) 98.1 F (36.7 C) 97.8 F (36.6 C)  TempSrc:  Oral Oral   SpO2: 93% 93% 91% 90%    Intake/Output Summary (Last 24 hours) at 08/13/2021 2249 Last data filed at 08/13/2021 1848 Gross per 24 hour  Intake 553.17 ml  Output 1100 ml  Net -546.83 ml    PULM  CTAB CV  RRR VASC  1+ DP left lower extremity, groin clean dry intact  Laboratory CBC    Component Value Date/Time   WBC 14.9 (H) 08/13/2021 0525   HGB 11.8 (L) 08/13/2021 0525   HGB 12.8 10/20/2020 1554   HCT 41.0 08/13/2021 0525   HCT 41.2 10/20/2020 1554   PLT 477 (H) 08/13/2021 0525   PLT 415 10/20/2020 1554    BMET    Component Value Date/Time   NA 137 08/13/2021 1715   NA 141 10/20/2020 1554   K 5.5 (H) 08/13/2021 1715   CL 100 08/13/2021 1715   CO2 29 08/13/2021 1715   GLUCOSE 202 (H) 08/13/2021 1715   BUN 57 (H) 08/13/2021 1715   BUN 37 (H) 10/20/2020 1554   CREATININE 4.07 (H) 08/13/2021 1715   CREATININE 1.37 (H) 03/21/2015 1451   CALCIUM 8.4 (L) 08/13/2021 1715   GFRNONAA 11 (L) 08/13/2021 1715   GFRAA 33 (L) 04/23/2019 1446    Assessment/Planning: POD #1 s/p left lower extremity angiogram  Patient's groin site is doing well without bruising in the left foot is warm with a palpable DP pulse. The patient has had nausea with vomiting and is currently having issues with desaturation requiring O2 despite not needing any oxygen prior to hospitalization.  Her mentation also seems somewhat  clouded. Chest x-ray ordered and negative for any evidence of effusion.  KUB ordered no evidence of ileus Prior to entering into the patient's creatinine was 3.85.  Today the patient's creatinine is 4.07.  9 months ago the patient's creatinine was 2.62.  Her potassium is also 5.5.  Based on this I suspect the patient has an acute kidney injury.  We will have the patient with strict intake and output.  We will place a consult to nephrology.  Kris Hartmann  08/13/2021, 10:49 PM

## 2021-08-13 NOTE — Progress Notes (Signed)
Patient reports not as SOB.  Tried to wean off oxygen, however patient's O2 saturation dropped to 87% on 2 L.   Placed patient back on 3 L and saturation improved to 91-93%  Patient vomited again.  MD made aware.

## 2021-08-13 NOTE — Progress Notes (Signed)
Patient vomited multiple times this morning.  Also complained of SOB.  Made MD aware. MD ordered dose of lasix IV  Gave PRN zofran and IV lasix  Will continue to monitor

## 2021-08-13 NOTE — H&P (View-Only) (Signed)
[facility]  Daily Progress Note   Subjective  -   Patient has a very decreased appetite today.  Has had several times and is forgetful and repeats self multiple times.  She has had issues with O2 saturation despite being on 2 L nasal cannula.  Patient denies any post angiogram related issues. Objective Vitals:   08/13/21 1115 08/13/21 1158 08/13/21 1650 08/13/21 1947  BP: (!) 127/50 130/67 130/67 (!) 136/55  Pulse: 73 75 73 76  Resp: '18  18 18  '$ Temp: (!) 97.5 F (36.4 C) 98.4 F (36.9 C) 98.1 F (36.7 C) 97.8 F (36.6 C)  TempSrc:  Oral Oral   SpO2: 93% 93% 91% 90%    Intake/Output Summary (Last 24 hours) at 08/13/2021 2249 Last data filed at 08/13/2021 1848 Gross per 24 hour  Intake 553.17 ml  Output 1100 ml  Net -546.83 ml    PULM  CTAB CV  RRR VASC  1+ DP left lower extremity, groin clean dry intact  Laboratory CBC    Component Value Date/Time   WBC 14.9 (H) 08/13/2021 0525   HGB 11.8 (L) 08/13/2021 0525   HGB 12.8 10/20/2020 1554   HCT 41.0 08/13/2021 0525   HCT 41.2 10/20/2020 1554   PLT 477 (H) 08/13/2021 0525   PLT 415 10/20/2020 1554    BMET    Component Value Date/Time   NA 137 08/13/2021 1715   NA 141 10/20/2020 1554   K 5.5 (H) 08/13/2021 1715   CL 100 08/13/2021 1715   CO2 29 08/13/2021 1715   GLUCOSE 202 (H) 08/13/2021 1715   BUN 57 (H) 08/13/2021 1715   BUN 37 (H) 10/20/2020 1554   CREATININE 4.07 (H) 08/13/2021 1715   CREATININE 1.37 (H) 03/21/2015 1451   CALCIUM 8.4 (L) 08/13/2021 1715   GFRNONAA 11 (L) 08/13/2021 1715   GFRAA 33 (L) 04/23/2019 1446    Assessment/Planning: POD #1 s/p left lower extremity angiogram  Patient's groin site is doing well without bruising in the left foot is warm with a palpable DP pulse. The patient has had nausea with vomiting and is currently having issues with desaturation requiring O2 despite not needing any oxygen prior to hospitalization.  Her mentation also seems somewhat  clouded. Chest x-ray ordered and negative for any evidence of effusion.  KUB ordered no evidence of ileus Prior to entering into the patient's creatinine was 3.85.  Today the patient's creatinine is 4.07.  9 months ago the patient's creatinine was 2.62.  Her potassium is also 5.5.  Based on this I suspect the patient has an acute kidney injury.  We will have the patient with strict intake and output.  We will place a consult to nephrology.  Kris Hartmann  08/13/2021, 10:49 PM

## 2021-08-14 ENCOUNTER — Inpatient Hospital Stay: Payer: Medicare Other

## 2021-08-14 ENCOUNTER — Encounter
Admission: RE | Disposition: A | Discharge status: Home Health | Payer: Self-pay | Source: Home / Self Care | Attending: Vascular Surgery

## 2021-08-14 DIAGNOSIS — N179 Acute kidney failure, unspecified: Secondary | ICD-10-CM | POA: Diagnosis not present

## 2021-08-14 DIAGNOSIS — E875 Hyperkalemia: Secondary | ICD-10-CM

## 2021-08-14 DIAGNOSIS — I4510 Unspecified right bundle-branch block: Secondary | ICD-10-CM

## 2021-08-14 DIAGNOSIS — N189 Chronic kidney disease, unspecified: Secondary | ICD-10-CM

## 2021-08-14 DIAGNOSIS — I5033 Acute on chronic diastolic (congestive) heart failure: Secondary | ICD-10-CM | POA: Diagnosis present

## 2021-08-14 DIAGNOSIS — K922 Gastrointestinal hemorrhage, unspecified: Secondary | ICD-10-CM | POA: Diagnosis present

## 2021-08-14 DIAGNOSIS — J9601 Acute respiratory failure with hypoxia: Secondary | ICD-10-CM | POA: Diagnosis not present

## 2021-08-14 HISTORY — PX: TEMPORARY DIALYSIS CATHETER: CATH118312

## 2021-08-14 LAB — CBC
HCT: 38.4 % (ref 36.0–46.0)
Hemoglobin: 11.1 g/dL — ABNORMAL LOW (ref 12.0–15.0)
MCH: 24.4 pg — ABNORMAL LOW (ref 26.0–34.0)
MCHC: 28.9 g/dL — ABNORMAL LOW (ref 30.0–36.0)
MCV: 84.6 fL (ref 80.0–100.0)
Platelets: 449 10*3/uL — ABNORMAL HIGH (ref 150–400)
RBC: 4.54 MIL/uL (ref 3.87–5.11)
RDW: 15.9 % — ABNORMAL HIGH (ref 11.5–15.5)
WBC: 14.4 10*3/uL — ABNORMAL HIGH (ref 4.0–10.5)
nRBC: 0 % (ref 0.0–0.2)

## 2021-08-14 LAB — BASIC METABOLIC PANEL
Anion gap: 7 (ref 5–15)
BUN: 60 mg/dL — ABNORMAL HIGH (ref 8–23)
CO2: 27 mmol/L (ref 22–32)
Calcium: 8.4 mg/dL — ABNORMAL LOW (ref 8.9–10.3)
Chloride: 102 mmol/L (ref 98–111)
Creatinine, Ser: 4.13 mg/dL — ABNORMAL HIGH (ref 0.44–1.00)
GFR, Estimated: 11 mL/min — ABNORMAL LOW (ref >60)
Glucose, Bld: 243 mg/dL — ABNORMAL HIGH (ref 70–99)
Potassium: 5.8 mmol/L — ABNORMAL HIGH (ref 3.5–5.1)
Sodium: 136 mmol/L (ref 135–145)

## 2021-08-14 LAB — TYPE AND SCREEN
ABO/RH(D): A POS
Antibody Screen: NEGATIVE

## 2021-08-14 LAB — PROCALCITONIN: Procalcitonin: <0.1 ng/mL

## 2021-08-14 LAB — GLUCOSE, CAPILLARY
Glucose-Capillary: 236 mg/dL — ABNORMAL HIGH (ref 70–99)
Glucose-Capillary: 236 mg/dL — ABNORMAL HIGH (ref 70–99)
Glucose-Capillary: 239 mg/dL — ABNORMAL HIGH (ref 70–99)
Glucose-Capillary: 240 mg/dL — ABNORMAL HIGH (ref 70–99)
Glucose-Capillary: 301 mg/dL — ABNORMAL HIGH (ref 70–99)
Glucose-Capillary: 316 mg/dL — ABNORMAL HIGH (ref 70–99)

## 2021-08-14 LAB — POTASSIUM
Potassium: 6 mmol/L — ABNORMAL HIGH (ref 3.5–5.1)
Potassium: 5.5 mmol/L — ABNORMAL HIGH (ref 3.5–5.1)
Potassium: 5.5 mmol/L — ABNORMAL HIGH (ref 3.5–5.1)
Potassium: 5.9 mmol/L — ABNORMAL HIGH (ref 3.5–5.1)
Potassium: 5.3 mmol/L — ABNORMAL HIGH (ref 3.5–5.1)

## 2021-08-14 LAB — HEMOGLOBIN AND HEMATOCRIT, BLOOD
HCT: 36.5 % (ref 36.0–46.0)
HCT: 35.3 % — ABNORMAL LOW (ref 36.0–46.0)
Hemoglobin: 10.7 g/dL — ABNORMAL LOW (ref 12.0–15.0)
Hemoglobin: 10.5 g/dL — ABNORMAL LOW (ref 12.0–15.0)

## 2021-08-14 LAB — PROTIME-INR
INR: 1.5 — ABNORMAL HIGH (ref 0.8–1.2)
Prothrombin Time: 18.3 seconds — ABNORMAL HIGH (ref 11.4–15.2)

## 2021-08-14 LAB — HEPATITIS B SURFACE ANTIGEN: Hepatitis B Surface Ag: NONREACTIVE

## 2021-08-14 LAB — HEPATITIS B CORE ANTIBODY, TOTAL: Hep B Core Total Ab: NONREACTIVE

## 2021-08-14 LAB — APTT: aPTT: 35 seconds (ref 24–36)

## 2021-08-14 SURGERY — TEMPORARY DIALYSIS CATHETER
Anesthesia: Moderate Sedation

## 2021-08-14 MED ORDER — METOCLOPRAMIDE HCL 5 MG/ML IJ SOLN: 10.0000 mg | Freq: Four times a day (QID) | INTRAMUSCULAR | Status: DC | PRN

## 2021-08-14 MED ORDER — HEPARIN SODIUM (PORCINE) 1000 UNIT/ML DIALYSIS
1000.0000 [IU] | INTRAMUSCULAR | Status: DC | PRN
  Administered 2021-08-15: 1000 [IU] via INTRAVENOUS_CENTRAL
  Filled 2021-08-14 (×2): qty 1

## 2021-08-14 MED ORDER — ORAL CARE MOUTH RINSE
15.0000 mL | OROMUCOSAL | Status: DC | PRN
Start: 2021-08-14 — End: 2021-08-23

## 2021-08-14 MED ORDER — INSULIN ASPART 100 UNIT/ML IV SOLN
5.0000 [IU] | Freq: Once | INTRAVENOUS | Status: AC
  Administered 2021-08-14: 5 [IU] via INTRAVENOUS
  Filled 2021-08-14: qty 0.05

## 2021-08-14 MED ORDER — INSULIN DETEMIR 100 UNIT/ML ~~LOC~~ SOLN
10.0000 [IU] | Freq: Every day | SUBCUTANEOUS | Status: DC
  Administered 2021-08-14 – 2021-08-17 (×4): 10 [IU] via SUBCUTANEOUS
  Filled 2021-08-14 (×4): qty 0.1

## 2021-08-14 MED ORDER — CEFAZOLIN SODIUM-DEXTROSE 2-4 GM/100ML-% IV SOLN
2.0000 g | INTRAVENOUS | Status: DC
  Filled 2021-08-14: qty 100

## 2021-08-14 MED ORDER — FAMOTIDINE 20 MG PO TABS: 40.0000 mg | ORAL_TABLET | Freq: Once | ORAL | Status: DC | PRN

## 2021-08-14 MED ORDER — DEXTROSE 50 % IV SOLN
1.0000 | Freq: Once | INTRAVENOUS | Status: AC
Start: 2021-08-14 — End: 2021-08-14
  Administered 2021-08-14: 50 mL via INTRAVENOUS
  Filled 2021-08-14: qty 50

## 2021-08-14 MED ORDER — DIPHENHYDRAMINE HCL 50 MG/ML IJ SOLN: 50.0000 mg | Freq: Once | INTRAMUSCULAR | Status: DC | PRN

## 2021-08-14 MED ORDER — ORAL CARE MOUTH RINSE
15.0000 mL | OROMUCOSAL | Status: DC
  Administered 2021-08-14 – 2021-08-23 (×22): 15 mL via OROMUCOSAL

## 2021-08-14 MED ORDER — MIDAZOLAM HCL 2 MG/ML PO SYRP: 8.0000 mg | ORAL_SOLUTION | Freq: Once | ORAL | Status: DC | PRN

## 2021-08-14 MED ORDER — ALTEPLASE 2 MG IJ SOLR: 2.0000 mg | Freq: Once | INTRAMUSCULAR | Status: DC | PRN

## 2021-08-14 MED ORDER — HYDROMORPHONE HCL 1 MG/ML IJ SOLN: 1.0000 mg | Freq: Once | INTRAMUSCULAR | Status: DC | PRN

## 2021-08-14 MED ORDER — ALBUTEROL SULFATE (2.5 MG/3ML) 0.083% IN NEBU
10.0000 mg | INHALATION_SOLUTION | Freq: Once | RESPIRATORY_TRACT | Status: AC
Start: 2021-08-14 — End: 2021-08-14
  Administered 2021-08-14: 10 mg via RESPIRATORY_TRACT
  Filled 2021-08-14: qty 12

## 2021-08-14 MED ORDER — CHLORHEXIDINE GLUCONATE CLOTH 2 % EX PADS
6.0000 | MEDICATED_PAD | Freq: Every day | CUTANEOUS | Status: DC
  Administered 2021-08-15 – 2021-08-23 (×9): 6 via TOPICAL

## 2021-08-14 MED ORDER — SODIUM CHLORIDE 0.9 % IV SOLN: INTRAVENOUS | Status: DC

## 2021-08-14 MED ORDER — ONDANSETRON HCL 4 MG/2ML IJ SOLN: 4.0000 mg | Freq: Four times a day (QID) | INTRAMUSCULAR | Status: DC | PRN

## 2021-08-14 MED ORDER — FUROSEMIDE 10 MG/ML IJ SOLN
20.0000 mg | Freq: Once | INTRAMUSCULAR | Status: AC
  Administered 2021-08-14: 20 mg via INTRAVENOUS
  Filled 2021-08-14: qty 2

## 2021-08-14 MED ORDER — METHYLPREDNISOLONE SODIUM SUCC 125 MG IJ SOLR: 125.0000 mg | Freq: Once | INTRAMUSCULAR | Status: DC | PRN

## 2021-08-14 MED ORDER — PANTOPRAZOLE SODIUM 40 MG IV SOLR
40.0000 mg | Freq: Two times a day (BID) | INTRAVENOUS | Status: DC
  Administered 2021-08-14 – 2021-08-16 (×5): 40 mg via INTRAVENOUS
  Filled 2021-08-14 (×5): qty 10

## 2021-08-14 MED ORDER — LIDOCAINE-PRILOCAINE 2.5-2.5 % EX CREA: 1.0000 | TOPICAL_CREAM | CUTANEOUS | Status: DC | PRN

## 2021-08-14 MED ORDER — CLOPIDOGREL BISULFATE 75 MG PO TABS
75.0000 mg | ORAL_TABLET | Freq: Every day | ORAL | Status: DC
Start: 2021-08-14 — End: 2021-08-23
  Administered 2021-08-14 – 2021-08-23 (×10): 75 mg via ORAL
  Filled 2021-08-14 (×10): qty 1

## 2021-08-14 MED ORDER — INSULIN ASPART 100 UNIT/ML IJ SOLN
0.0000 [IU] | INTRAMUSCULAR | Status: DC
  Administered 2021-08-14 (×2): 5 [IU] via SUBCUTANEOUS
  Administered 2021-08-14: 11 [IU] via SUBCUTANEOUS
  Administered 2021-08-15 (×2): 5 [IU] via SUBCUTANEOUS
  Filled 2021-08-14 (×6): qty 1

## 2021-08-14 MED ORDER — FUROSEMIDE 10 MG/ML IJ SOLN
60.0000 mg | Freq: Once | INTRAMUSCULAR | Status: AC
  Administered 2021-08-14: 60 mg via INTRAVENOUS
  Filled 2021-08-14: qty 6

## 2021-08-14 MED ORDER — PENTAFLUOROPROP-TETRAFLUOROETH EX AERO: 1.0000 | INHALATION_SPRAY | CUTANEOUS | Status: DC | PRN

## 2021-08-14 MED ORDER — LIDOCAINE HCL (PF) 1 % IJ SOLN: 5.0000 mL | INTRAMUSCULAR | Status: DC | PRN

## 2021-08-14 MED ORDER — LIDOCAINE HCL (PF) 1 % IJ SOLN
INTRAMUSCULAR | Status: DC | PRN
  Administered 2021-08-14: 10 mL

## 2021-08-14 SURGICAL SUPPLY — 2 items
COVER PROBE U/S 5X48 (MISCELLANEOUS) ×1 IMPLANT
KIT DIALYSIS CATH TRI 30X13 (CATHETERS) ×1 IMPLANT

## 2021-08-14 NOTE — Op Note (Signed)
  OPERATIVE NOTE   PROCEDURE: Insertion of temporary dialysis catheter catheter right femoral approach.  PRE-OPERATIVE DIAGNOSIS: Acute on chronic renal insufficiency  POST-OPERATIVE DIAGNOSIS: Same  SURGEON: Katha Cabal M.D.  ANESTHESIA: 1% lidocaine local infiltration  ESTIMATED BLOOD LOSS: Minimal cc  INDICATIONS:   Jocelyn Wells is a 66 y.o. female who presents with worsening of her renal status.  This has been associated with increased uremic symptoms as well as volume overload.  At this point the plan is to initiate dialysis with the hope that it is temporary.  Risks and benefits for temporary catheter reviewed all questions answered patient has agreed to proceed.  DESCRIPTION: After obtaining full informed written consent, the patient was positioned supine. The right groin was prepped and draped in a sterile fashion. Ultrasound was placed in a sterile sleeve. Ultrasound was utilized to identify the right common femoral vein which is noted to be echolucent and compressible indicating patency. Images recorded for the permanent record. Under real-time visualization a Seldinger needle is inserted into the vein and the guidewires advanced without difficulty. Small counterincision was made at the wire insertion site. Dilator is passed over the wire and the temporary dialysis catheter catheter is fed over the wire without difficulty.  All lumens aspirate and flush easily and are packed with heparin saline. Catheter secured to the skin of the right thigh with 2-0 silk. A sterile dressing is applied with Biopatch.  COMPLICATIONS: None  CONDITION: Unchanged  [name] Office:  [phone] 08/14/2021, 4:58 PM

## 2021-08-14 NOTE — Assessment & Plan Note (Signed)
Complicates overall prognosis and care Lifestyle modification and exercise has been discussed with patient 

## 2021-08-14 NOTE — Interval H&P Note (Signed)
History and Physical Interval Note:  08/14/2021 4:57 PM  Jocelyn Wells  has presented today for surgery, with the diagnosis of End stage renal disease.  The various methods of treatment have been discussed with the patient and family. After consideration of risks, benefits and other options for treatment, the patient has consented to  Procedure(s): TEMPORARY DIALYSIS CATHETER (N/A) as a surgical intervention.  The patient's history has been reviewed, patient examined, no change in status, stable for surgery.  I have reviewed the patient's chart and labs.  Questions were answered to the patient's satisfaction.     [name]

## 2021-08-14 NOTE — Significant Event (Signed)
Rapid Response Event Note   Reason for Call :  SOB  Vomiting Blood tinge material  Initial Focused Assessment:  Patient short of breath but able to make conversation with nurses in the room. Patient Alert and oriented. Patients Vitals on arrival to room BP 111/100 O2 91 on 6L RR 30 HR 87. Patient vomited red blood like material on gown and bed pad. Patients Lung sounds on auscultation  rhonchi bilaterally.     Interventions:  -Chest xray -'80mg'$  of lasix -Labs BMP,CBC, APTT, INR -Hospitalist consulted per Dr Delana Meyer -Zofran  -Bipap  Zofran given and patient started on bipap. Patient's vitals on bipap BP 126/55 HR 76 RR 27 O2 99.  Patients breathing starting to improve respiratory at bedside.  Bedside staff informed to reach out to rapid response nurse if anything changes. Plan of Care:  Review labs  Monitor Vitals and output Monitor and Maintain patients airway.    Event Summary:   MD Notified:  Carroll Kinds MD Call [place]  Gonzella Lex, RN

## 2021-08-14 NOTE — Consult Note (Addendum)
Initial Consultation Note   Patient: Jocelyn Wells WFU:[id number] DOB: February 22, 1955 PCP: Gwyneth Sprout, FNP DOA: 08/12/2021 DOS: the patient was seen and examined on 08/14/2021 Primary service: Katha Cabal, MD  Referring physician: Dr Delana Meyer Reason for consult: AKI, respiratory failure  Assessment/Plan: Assessment and Plan: * AKI (acute kidney injury) ([place]) At baseline patient has stage IV chronic kidney disease with serum creatinine of 2.56 She is noted to have worsening of her renal function from baseline and today serum creatinine is 4.13 with hyperkalemia and signs of fluid overload Acute kidney injury appears to be multifactorial and related to recent contrast administration as well as medication induced Hold losartan and hydrochlorothiazide Hold further doses of Lasix Treat hyperkalemia with bronchodilators, insulin and glucose Continue BiPAP for now Consult nephrology for consideration for renal replacement therapy Obtain renal ultrasound  GI bleed Patient had an episode of emesis containing blood Concern for possible acute gastritis ??  Uremic due to worsening renal failure Place patient on IV Protonix Check serial H&H Transfuse as needed Consult GI Keep patient n.p.o. for now  Acute respiratory failure with hypoxia ([place]) Most likely secondary to fluid overload Patient developed worsening shortness of breath and was initially on nasal cannula but is now on BiPAP due to increased work of breathing and hypoxia. We will attempt to wean patient off BiPAP once respiratory status improved  Acute on chronic diastolic CHF (congestive heart failure) ([place]) Patient with worsening shortness of breath and hypoxia currently on BiPAP to reduce work of breathing and improve oxygenation. Received a dose of Lasix 60 mg IV Last known LVEF was 60 to 65% from a 2D echocardiogram done in 2019  Follow-up results of repeat 2D echocardiogram Hold IV fluid Hold losartan and Lasix due to  worsening renal Function We will also hold carvedilol since patient is normotensive  Atherosclerosis of native arteries of the extremities with ulceration ([place]) Patient has a history of atherosclerotic occlusive disease involving bilateral lower extremities with ulceration of the left lower extremity. She is status post successful recanalization of the left lower extremity for limb salvage Continue atorvastatin Hold aspirin due to concerns of upper GI bleed  Type 2 diabetes mellitus with other diabetic kidney complication ([place]) Patient has a history of type 2 diabetes mellitus with complications of stage IV chronic kidney disease. Baseline serum creatinine is 2.56 Keep patient n.p.o. for now Check blood sugars every 4 hours Place patient on Levemir 10 units daily and sliding scale insulin for glycemic control  Morbid obesity due to excess calories ([place]) Complicates overall prognosis and care Lifestyle modification and exercise has been discussed with patient       TR[facility] will continue to follow the patient.  HPI: Jocelyn Wells is a 66 y.o. 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 Patient is status post peripheral angioplasty with successful recannulization of the left lower extremity for limb salvage. Medical consult is requested for worsening renal function and respiratory failure Patient has a baseline serum creatinine 2.56 from (06/23) and it is 4.13 today with hyperkalemia.  She has  no evidence of metabolic acidosis. She has had worsening hypoxia and was initially on 3 L of oxygen which was bumped to 6 L overnight due to worsening shortness of breath and is currently on a BiPAP. She was receiving IV fluids due to worsening renal function which have since been discontinued.  She received Lasix 60 mg IV. Rapid response was called early this morning because patient had emesis containing some blood. She complains of abdominal pain mostly in the left lower quadrant but denies having any changes in her bowel habits.  She denies having any fever, no chills, no cough, no chest pain, no headache, no blurred vision, no diaphoresis or focal deficits.   Review of Systems: As mentioned in the history of present illness. All other systems reviewed and are negative. Past Medical History:  Diagnosis Date   Anxiety    Cardiomyopathy [facility])    new to her Jan 2017   Chronic kidney disease    COPD (chronic obstructive pulmonary disease) ([place])    Depression    Diabetes mellitus, type II ([place])    HTN (hypertension)    Obesity    Osteoporosis    PONV (postoperative nausea and vomiting)    Stroke [facility])    Jan 2017   Past Surgical History:  Procedure Laterality Date   ABDOMINAL HYSTERECTOMY     ANKLE FRACTURE SURGERY Left 2002   BILATERAL SALPINGOOPHORECTOMY  2000   CARDIAC CATHETERIZATION N/A 03/25/2015   Procedure: Right/Left Heart Cath and Coronary Angiography;  Surgeon: Belva Crome, MD;  Location: [place] CV LAB;  Service: Cardiovascular;  Laterality: N/A;   CESAREAN SECTION  1978   COLONOSCOPY WITH PROPOFOL N/A 09/22/2016   Procedure: COLONOSCOPY WITH PROPOFOL;  Surgeon: Manya Silvas, MD;  Location[facility] ENDOSCOPY;  Service: Endoscopy;  Laterality: N/A;   EP IMPLANTABLE DEVICE N/A 08/05/2015   Procedure: Loop Recorder Insertion;  Surgeon: Deboraha Sprang, MD;  Location: [place] [facility];  Service: Cardiovascular;  Laterality: N/A;   ESOPHAGOGASTRODUODENOSCOPY (EGD)  WITH PROPOFOL N/A 09/22/2016   Procedure: ESOPHAGOGASTRODUODENOSCOPY (EGD) WITH PROPOFOL;  Surgeon: Manya Silvas, MD;  Location: [facility] ENDOSCOPY;  Service: Endoscopy;  Laterality: N/A;   ESOPHAGOGASTRODUODENOSCOPY (EGD) WITH PROPOFOL N/A 12/08/2016   Procedure: ESOPHAGOGASTRODUODENOSCOPY (EGD) WITH PROPOFOL;  Surgeon: Manya Silvas, MD;  Location: [facility] ENDOSCOPY;  Service: Endoscopy;  Laterality: N/A;   KNEE ARTHROSCOPY Left 2005   LOWER EXTREMITY ANGIOGRAPHY Left 08/12/2021   Procedure: Lower Extremity Angiography;  Surgeon: Katha Cabal, MD;  Location: [place] [facility];  Service: Cardiovascular;  Laterality: Left;   TEE WITHOUT CARDIOVERSION N/A 01/31/2015   Procedure: TRANSESOPHAGEAL ECHOCARDIOGRAM (TEE);  Surgeon: Lelon Perla, MD;  Location: [facility] ENDOSCOPY;  Service: Cardiovascular;  Laterality: N/A;   [place]  2008   Social History:  reports that she has quit smoking. Her smoking use included cigarettes. She started smoking about 47 years ago. She has a 36.00 pack-year smoking history. She has never used smokeless tobacco. She reports that she does not drink alcohol and does not use drugs.  Allergies  Allergen Reactions   Codeine Nausea And Vomiting    Family History  Problem Relation Age of Onset   Heart disease Mother        died from CHF   Asthma Mother    Diabetes Mother    Heart disease Father    Aneurysm Father    COPD Brother    Diabetes Brother  Alcohol abuse Paternal Aunt    Anemia Neg Hx    Arrhythmia Neg Hx    Clotting disorder Neg Hx    Fainting Neg Hx    Heart attack Neg Hx    Heart failure Neg Hx    Hyperlipidemia Neg Hx    Hypertension Neg Hx     Prior to Admission medications   Medication Sig Start Date End Date Taking? Authorizing Provider  albuterol (VENTOLIN HFA) 108 (90 Base) MCG/ACT inhaler TAKE 2 PUFFS BY MOUTH EVERY 6 HOURS AS NEEDED FOR WHEEZE OR SHORTNESS OF BREATH 06/19/21  Yes Gwyneth Sprout, FNP  atorvastatin (LIPITOR) 40 MG tablet TAKE 1 TABLET BY MOUTH EVERY DAY 07/14/20  Yes Chrismon, Vickki Muff, PA-C  carvedilol (COREG) 12.5 MG tablet Take 1 tablet (12.5 mg total) by mouth 2 (two) times daily with a meal. 07/17/21  Yes Agbor-Etang, Aaron Edelman, MD  denosumab (PROLIA) 60 MG/ML SOSY injection Inject 60 mg into the skin every 6 (six) months.   Yes [provider]  Dulaglutide 3 MG/0.5ML SOPN Inject into the skin once a week. 04/17/21  Yes [provider]  fluticasone (FLONASE) 50 MCG/ACT nasal spray Place 2 sprays into both nostrils daily. 02/13/21  Yes Gwyneth Sprout, FNP  gabapentin (NEURONTIN) 300 MG capsule Take 300 mg by mouth 3 (three) times daily.   Yes [provider]  insulin aspart (NOVOLOG) 100 UNIT/ML injection Inject up to 100 units daily in insulin pump 05/26/20  Yes [provider]  Insulin Disposable Pump (OMNIPOD DASH PODS, GEN 4,) MISC USE 1 POD EACH EVERY 72    HOURS 10/29/20  Yes [provider]  Insulin Human (INSULIN PUMP) SOLN Per endocrinoloy 06/01/20  Yes Elgergawy, Silver Huguenin, MD  loratadine (CLARITIN) 10 MG tablet Take 1 tablet by mouth daily.   Yes [provider]  losartan (COZAAR) 50 MG tablet Take 1 tablet (50 mg total) by mouth daily. 07/17/21  Yes Agbor-Etang, Aaron Edelman, MD  ondansetron (ZOFRAN) 4 MG tablet Take 1 tablet (4 mg total) by mouth every 8 (eight) hours as needed for nausea or vomiting. 02/13/21  Yes Gwyneth Sprout, FNP  PARoxetine (PAXIL) 40 MG tablet Take 1 tablet (40 mg total) by mouth daily. 07/13/21 10/11/21 Yes Hisada, Elie Goody, MD  sulfamethoxazole-trimethoprim (BACTRIM DS) 800-160 MG tablet Take 1 tablet by mouth 2 (two) times daily. 08/07/21  Yes Kris Hartmann, NP  torsemide (DEMADEX) 20 MG tablet Take 1 tablet (20 mg total) by mouth daily. 07/17/21 10/16/22 Yes Agbor-Etang, Aaron Edelman, MD  TRULICITY 3 OJ/5.0KX SOPN Inject into the skin. 04/18/21  Yes [provider]  Vitamin D, Ergocalciferol, (DRISDOL)  1.25 MG (50000 UT) CAPS capsule Take 50,000 Units by mouth once a week. (Sundays) 02/21/18  Yes [provider]  apixaban (ELIQUIS) 5 MG TABS tablet Take 1 tablet (5 mg total) by mouth 2 (two) times daily. 12/16/20   Schnier, Dolores Lory, MD  B-D INS SYRINGE 0.5CC/31GX5/16 31G X 5/16" 0.5 ML MISC  02/23/16   [provider]  diclofenac (VOLTAREN) 75 MG EC tablet Take 75 mg by mouth daily. Patient not taking: Reported on 08/12/2021    [provider]  glucose blood test strip OneTouch Verio strips    [provider]  hydrochlorothiazide (HYDRODIURIL) 25 MG tablet Take 25 mg by mouth daily. Patient not taking: Reported on 08/05/2021 07/27/21   [provider]  Spacer/Aero-Holding Chambers ([place]) MISC Use with inhaler to ensure medication is delivered throughout lungs  02/13/21   Gwyneth Sprout, FNP    Physical Exam: Vitals:   08/14/21 9381 08/14/21 0836 08/14/21 1143 08/14/21 1144  BP:    120/77  Pulse: 74   82  Resp: (!) 39   20  Temp:    98 F (36.7 C)  TempSrc:      SpO2: 95% 98% 95% 92%   Physical Exam Vitals and nursing note reviewed.  Constitutional:      Appearance: She is obese.     Comments: On BiPAP.  Appears comfortable  HENT:     Head: Normocephalic and atraumatic.     Mouth/Throat:     Mouth: Mucous membranes are moist.  Eyes:     Comments: Pale conjunctiva  Cardiovascular:     Rate and Rhythm: Normal rate and regular rhythm.  Pulmonary:     Breath sounds: Rhonchi present.  Abdominal:     General: Bowel sounds are normal.     Palpations: Abdomen is soft.     Tenderness: There is abdominal tenderness.     Comments: Left lower quadrant tenderness  Musculoskeletal:        General: Normal range of motion.     Cervical back: Normal range of motion and neck supple.     Comments: Ulceration over left anterior tibia  Skin:    General: Skin is warm and dry.  Neurological:     Mental Status: She is alert.     Motor:  Weakness present.  Psychiatric:        Mood and Affect: Mood normal.        Behavior: Behavior normal.     Data Reviewed:  Relevant notes from primary care and specialist visits, past discharge summaries as available in EHR, including Care Everywhere. Prior diagnostic testing as pertinent to current admission diagnoses Updated medications and problem lists for reconciliation ED course, including vitals, labs, imaging, treatment and response to treatment Triage notes, nursing and pharmacy notes and ED provider's notes Notable results as noted in HPI Labs reviewed.  Sodium 136, potassium 5.8, chloride 102, bicarb 27, glucose 243, BUN 60, creatinine 4.13, calcium 8.4, PT 18.3, INR 1.5, white count 14.4, hemoglobin 11.1, hematocrit 38.4 RDW 15.9, platelet count 449 Chest x-ray reviewed by me shows lower lobe infiltrates, greater on the right. Chronic cardiomegaly and airway thickening.  There are no new results to review at this time.    Family Communication: Greater than 50% of time was spent discussing patient's condition and plan of care with her at the bedside.  All questions and concerns have been addressed. Primary team communication:  Thank you very much for involving [place] in the care of your patient.  Author: Collier Bullock, MD 08/14/2021 12:03 PM  For on call review [email or web address].

## 2021-08-14 NOTE — Progress Notes (Addendum)
Pt is having more SOB and desating to 85-90 % on 4 liters. Pt was bumped to 5 liters still sating at 90 %. Placed on 6 liters and sating 91 %. Pt vomited a dark colored liquid. Rapid response page. MD Schnier made aware and ordered to stopped fluids, ordered 80 mg of IV lasix, chest x-ray, and BIPAP at this time. Fluids stopped. Lasix and zofran was administered.  Pt placed on BIPAP. Will continue to monitor.

## 2021-08-14 NOTE — Assessment & Plan Note (Addendum)
At baseline patient has stage IV chronic kidney disease with serum creatinine of 2.56 She is noted to have worsening of her renal function from baseline and today serum creatinine is 4.13 with hyperkalemia and signs of fluid overload Acute kidney injury appears to be multifactorial and related to recent contrast administration as well as medication induced Hold losartan and hydrochlorothiazide Hold further doses of Lasix Treat hyperkalemia with bronchodilators, insulin and glucose Continue BiPAP for now Consult nephrology for consideration for renal replacement therapy Obtain renal ultrasound

## 2021-08-14 NOTE — Inpatient Diabetes Management (Signed)
Inpatient Diabetes Program Recommendations  AACE/ADA: New Consensus Statement on Inpatient Glycemic Control   Target Ranges:  Prepandial:   less than 140 mg/dL      Peak postprandial:   less than 180 mg/dL (1-2 hours)      Critically ill patients:  140 - 180 mg/dL    Latest Reference Range & Units 08/14/21 05:42 08/14/21 07:27  Glucose-Capillary 70 - 99 mg/dL 236 (H) 239 (H)    Latest Reference Range & Units 08/13/21 07:56 08/13/21 11:52 08/13/21 16:48 08/13/21 20:14  Glucose-Capillary 70 - 99 mg/dL 236 (H) 243 (H) 208 (H) 240 (H)    Latest Reference Range & Units 08/14/21 05:52  Glucose 70 - 99 mg/dL 243 (H)   Review of Glycemic Control  Diabetes history: DM2 Outpatient Diabetes medications: Omnipod insulin pump with Novolog, FreeStyle Libre CGM, Trulicity 3 mg Qweek Current orders for Inpatient glycemic control: Novolog 0-15 units Q4H  Inpatient Diabetes Program Recommendations:    Insulin: Please consider ordering Semglee 20 units Q24H (starting now).  NOTE: Noted patient admitted following vascular procedure on 08/12/21. Per chart, patient has DM2 hx and uses an insulin pump outpatient. In reviewing chart, noted patient sees Dr. Gabriel Carina (Endocrinologist) and was last seen 06/01/21 and per office note, patient uses OmniPod Insulin Pump with FreeStyle Libre and the following should be insulin pump settings based on note:  Basal 1.5 units/hour  Total Basal: 36 units/24 hours  Insulin to Carb ratio: 1:5 grams (1 unit per 5 grams of carbs) Insulin Sensitivity: 1:20 mg/dl (1 unit drops glucose 20 mg/dl) Target Glucose 120 mg/dl  Called patient's room to inquire about insulin pump and patient's son answered the phone and stated patient was getting an EKG at this time. Patient's son stated that patient removed her insulin pump prior to coming to the hospital on 08/12/21 and her insulin pump is at home. Therefore, while inpatient, will need to use SQ insulin while  inpatient.  Thanks, Barnie Alderman, RN, MSN, [place] Diabetes Coordinator Inpatient Diabetes Program [phone] (Team Pager from 8am to [place])

## 2021-08-14 NOTE — Consult Note (Addendum)
GI Inpatient Consult Note  Reason for Consult: Hematemesis   Attending Requesting Consult: Dr. Francine Graven  History of Present Illness: Jocelyn Wells is a 66 y.o. female seen for evaluation of hematemesis at the request of Dr. Francine Graven. Patient has a PMH of anxiety, CHF with known LVEF 60 to 65%, paroxysmal atrial fibrillation on [place], PVD, hypertension, diabetes mellitus with stage IV chronic kidney disease, former tobacco smoking (quit March 2023), COPD, history of CVA due to thrombus left MCA, DVT on chronic anticoagulation therapy, lumbar spinal stenosis, and non bleeding gastric ulcer (2018).    Patient admitted 2 days ago to the vascular surgery service for evaluation of atherosclerotic occlusive disease involving bilateral lower extremities and ulceration with left lower extremity.  She is status post peripheral angioplasty and stent palcement with successful recannulation of the left lower extremity for limb salvage (08/12/21).  She is maintained on Eliquis and aspirin.  Baseline hemoglobin on 10/2020 at 12.8, BUN 37, creatinine 2.62, estimated GFR 20 to 27%. Per staff, she developed nausea, and 3 episodes of vomiting yesterday and one episode of vomiting last night, with reports of dark emesis and possible blood with the emesis. Today, repeat hemoglobin is 11.8 to 11.1. BUN is 60 , Cr 4.13, e GFR 11%, K 5.8.  She had worsening hypoxia, tachypnea, and is now on BiPAP at time of eval. CXR demonstrates R>L infiltrate.  She has received IV Lasix.    Vital signs were 126/55, 79, 27, SPO2 99% on BiPAP.    Imaging studies:  revealed KUB with no evidence of bowel obstruction, moderate volume of stool throughout the colon, cholelithiasis.  Chest x-ray on admission with stable enlarged cardiac silhouette, no acute airspace disease. Now shows lower lobe infiltrates, greater on the right and chronic cardiomegaly and airway thickening. GI is consulted for evaluation and management of nausea, vomiting,  hematemesis.   The patient was interviewed today with her son Jocelyn Wells and daughter in law and husband present to give additional information.  The patient reports that she has had some mild random episodes of nausea prior to admission.  There is been no vomiting, heartburn, reflux or dysphagia.  She does not take a PPI medication.  Remote ulcer history noted 2018 (no intervention needed at that time- clean based).  She has a history of taking diclofenac, patient avoids all NSAIDs now with exception of ASA.  She denies any tobacco -quit in March, and no alcohol use.  She has had normal appetite, diet, and weight. No further nausea. She is currently NPO.  She has reported left lower quadrant abdominal pain to the staff, but denies any abdominal pain now. She reports regular formed stool. Last bowel movement was Tuesday.  She has a tendency to have mild constipation.  She has noted no blood or melena in the stool. She is receiving Protonix 40 mg IV twice daily, ondansetron  4 mg IV as needed, metoclopramide 10 mg IV as needed, Eliquis 5 mg twice daily, oxycodone 5-10 mg every 4 hours as needed for pain.  Last Colonoscopy: 09/22/2016 for follow-up personal history of colon polyps: Impression internal hemorrhoids, exam was otherwise normal.  Repeat colonoscopy 5 years.  Endoscopy: 09/22/2016 EGD for dysphagia: Impression normal esophagus - empirically dilated.  Nonbleeding gastric ulcer with no stigmata of bleeding.  Gastritis.  Normal examined duodenum.  Pathology: Stomach with mild chronic inactive gastritis.  Negative H. pylori, intestinal metaplasia dysplasia or malignancy.  Last EGD performed 12/08/2016 for follow-up acute gastric ulcer:  Impression normal esophagus, stomach, examined duodenum no specimens collected.   Past Medical History:  Past Medical History:  Diagnosis Date   Anxiety    Cardiomyopathy [facility])    new to her Jan 2017   Chronic kidney disease    COPD (chronic obstructive pulmonary  disease) ([place])    Depression    Diabetes mellitus, type II ([place])    HTN (hypertension)    Obesity    Osteoporosis    PONV (postoperative nausea and vomiting)    Stroke [facility])    Jan 2017    Problem List: Patient Active Problem List   Diagnosis Date Noted   GI bleed 08/14/2021   Acute respiratory failure with hypoxia ([place]) 08/14/2021   Atherosclerosis of native arteries of the extremities with ulceration ([place]) 08/12/2021   Positive depression screening 07/14/2021   DOE (dyspnea on exertion) 07/14/2021   Bilateral leg edema 07/14/2021   Fall 04/13/2021   Anemia in chronic kidney disease 03/18/2021   Heart failure, unspecified ([place]) 03/18/2021   Hyperparathyroidism due to renal insufficiency ([place]) 03/18/2021   Chronic depression 03/18/2021   Overweight 03/18/2021   Poor appetite 02/13/2021   MDD (major depressive disorder), recurrent episode, mild ([place]) 02/13/2021   Benign hypertensive kidney disease with chronic kidney disease 10/14/2020   CKD (chronic kidney disease), stage IV ([place]) 05/31/2020   Compression fracture of L1 lumbar vertebra ([place]) 01/30/2019   Compression fracture of body of thoracic vertebra ([place]) 01/30/2019   Lumbar foraminal stenosis (RIGHT L1) 01/30/2019   Spinal stenosis of lumbar region with neurogenic claudication (L4-L5) 01/30/2019   Osteoporosis, post-menopausal 04/14/2018   Vitamin D deficiency 04/14/2018   Chronic deep vein thrombosis (DVT) ([place]) 02/09/2018   Post-menopausal 11/17/2017   Aortic atherosclerosis ([place]) 11/08/2017   Snoring 04/24/2015   Sleep paralysis, recurrent isolated 04/24/2015   Hypersomnia with sleep apnea 04/24/2015   Cataplexy 04/24/2015   Morbid obesity due to excess calories ([place]) 04/24/2015   COPD (chronic obstructive pulmonary disease) ([place]) 51/88/4166   Embolic stroke ([place]) 08/08/1599   AKI (acute kidney injury) ([place]) 01/29/2015   Dizziness and giddiness 07/05/2014   Hypertension associated with diabetes ([place]) 07/05/2014    Carbuncle and furuncle 07/05/2014   Pain of perianal area 07/05/2014   Guttate psoriasis 07/05/2014   H/O: osteoarthritis 06/05/2014   Anxiety, generalized 06/05/2014   Microalbuminuria 12/23/2013   Long term current use of insulin ([place]) 12/23/2013   Compulsive tobacco user syndrome 12/23/2013   Type 2 diabetes mellitus with other diabetic kidney complication ([place]) 09/32/3557   Disturbance of skin sensation 09/02/2006   Difficulty hearing 07/25/2006   Cephalalgia 05/27/2006   Hyperlipidemia associated with type 2 diabetes mellitus ([place]) 10/22/2005   Arthritis, degenerative 10/22/2005    Past Surgical History: Past Surgical History:  Procedure Laterality Date   ABDOMINAL HYSTERECTOMY     ANKLE FRACTURE SURGERY Left 2002   BILATERAL SALPINGOOPHORECTOMY  2000   CARDIAC CATHETERIZATION N/A 03/25/2015   Procedure: Right/Left Heart Cath and Coronary Angiography;  Surgeon: Belva Crome, MD;  Location: [place] [facility];  Service: Cardiovascular;  Laterality: N/A;   CESAREAN SECTION  1978   COLONOSCOPY WITH PROPOFOL N/A 09/22/2016   Procedure: COLONOSCOPY WITH PROPOFOL;  Surgeon: Manya Silvas, MD;  Location: [facility] ENDOSCOPY;  Service: Endoscopy;  Laterality: N/A;   EP IMPLANTABLE DEVICE N/A 08/05/2015   Procedure: Loop Recorder Insertion;  Surgeon: Deboraha Sprang, MD;  Location: [place] [facility];  Service: Cardiovascular;  Laterality: N/A;   ESOPHAGOGASTRODUODENOSCOPY (EGD) WITH  PROPOFOL N/A 09/22/2016   Procedure: ESOPHAGOGASTRODUODENOSCOPY (EGD) WITH PROPOFOL;  Surgeon: Manya Silvas, MD;  Location: [facility] ENDOSCOPY;  Service: Endoscopy;  Laterality: N/A;   ESOPHAGOGASTRODUODENOSCOPY (EGD) WITH PROPOFOL N/A 12/08/2016   Procedure: ESOPHAGOGASTRODUODENOSCOPY (EGD) WITH PROPOFOL;  Surgeon: Manya Silvas, MD;  Location: [facility] ENDOSCOPY;  Service: Endoscopy;  Laterality: N/A;   KNEE ARTHROSCOPY Left 2005   LOWER EXTREMITY ANGIOGRAPHY Left 08/12/2021   Procedure: Lower Extremity  Angiography;  Surgeon: Katha Cabal, MD;  Location: [place] [facility];  Service: Cardiovascular;  Laterality: Left;   TEE WITHOUT CARDIOVERSION N/A 01/31/2015   Procedure: TRANSESOPHAGEAL ECHOCARDIOGRAM (TEE);  Surgeon: Lelon Perla, MD;  Location: [facility] ENDOSCOPY;  Service: Cardiovascular;  Laterality: N/A;   [place]  2008    Allergies: Allergies  Allergen Reactions   Codeine Nausea And Vomiting    Home Medications: Medications Prior to Admission  Medication Sig Dispense Refill Last Dose   albuterol (VENTOLIN HFA) 108 (90 Base) MCG/ACT inhaler TAKE 2 PUFFS BY MOUTH EVERY 6 HOURS AS NEEDED FOR WHEEZE OR SHORTNESS OF BREATH 8.5 each 2 Past Week   atorvastatin (LIPITOR) 40 MG tablet TAKE 1 TABLET BY MOUTH EVERY DAY 90 tablet 2 08/11/2021   carvedilol (COREG) 12.5 MG tablet Take 1 tablet (12.5 mg total) by mouth 2 (two) times daily with a meal. 60 tablet 3 08/11/2021   denosumab (PROLIA) 60 MG/ML SOSY injection Inject 60 mg into the skin every 6 (six) months.   Past Month   Dulaglutide 3 MG/0.5ML SOPN Inject into the skin once a week.   Past Week   fluticasone (FLONASE) 50 MCG/ACT nasal spray Place 2 sprays into both nostrils daily. 16 g 6 08/11/2021   gabapentin (NEURONTIN) 300 MG capsule Take 300 mg by mouth 3 (three) times daily.   08/11/2021   insulin aspart (NOVOLOG) 100 UNIT/ML injection Inject up to 100 units daily in insulin pump   08/11/2021   Insulin Disposable Pump (OMNIPOD DASH PODS, GEN 4,) MISC USE 1 POD EACH EVERY 72    HOURS   08/12/2021   Insulin Human (INSULIN PUMP) SOLN Per endocrinoloy   Past Week   loratadine (CLARITIN) 10 MG tablet Take 1 tablet by mouth daily.   08/11/2021   losartan (COZAAR) 50 MG tablet Take 1 tablet (50 mg total) by mouth daily. 30 tablet 3 08/11/2021   ondansetron (ZOFRAN) 4 MG tablet Take 1 tablet (4 mg total) by mouth every 8 (eight) hours as needed for nausea or vomiting. 20 tablet 0 Past Month   PARoxetine  (PAXIL) 40 MG tablet Take 1 tablet (40 mg total) by mouth daily. 90 tablet 0 08/11/2021   sulfamethoxazole-trimethoprim (BACTRIM DS) 800-160 MG tablet Take 1 tablet by mouth 2 (two) times daily. 14 tablet 0 08/11/2021   torsemide (DEMADEX) 20 MG tablet Take 1 tablet (20 mg total) by mouth daily. 30 tablet 3 10/15/6732   TRULICITY 3 LP/3.7TK SOPN Inject into the skin.   08/11/2021   Vitamin D, Ergocalciferol, (DRISDOL) 1.25 MG (50000 UT) CAPS capsule Take 50,000 Units by mouth once a week. (Sundays)   08/11/2021   apixaban (ELIQUIS) 5 MG TABS tablet Take 1 tablet (5 mg total) by mouth 2 (two) times daily. 180 tablet 3 08/08/2021 at 1900   B-D INS SYRINGE 0.5CC/31GX5/16 31G X 5/16" 0.5 ML MISC       diclofenac (VOLTAREN) 75 MG EC tablet Take 75 mg by mouth daily. (Patient not taking:  Reported on 08/12/2021)   Not Taking   glucose blood test strip OneTouch Verio strips      hydrochlorothiazide (HYDRODIURIL) 25 MG tablet Take 25 mg by mouth daily. (Patient not taking: Reported on 08/05/2021)   Completed Course   Spacer/Aero-Holding [facility] DIAMOND) MISC Use with inhaler to ensure medication is delivered throughout lungs 1 each 0    Home medication reconciliation was completed with the patient.   Scheduled Inpatient Medications:    apixaban  5 mg Oral BID   atorvastatin  40 mg Oral Daily   insulin aspart  0-15 Units Subcutaneous Q4H   mouth rinse  15 mL Mouth Rinse 4 times per day   pantoprazole (PROTONIX) IV  40 mg Intravenous Q12H   PARoxetine  40 mg Oral Daily   sodium chloride flush  3 mL Intravenous Q12H    Continuous Inpatient Infusions:    sodium chloride     sodium chloride Stopped (08/14/21 0545)    PRN Inpatient Medications:  sodium chloride, acetaminophen, albuterol, metoCLOPramide (REGLAN) injection, morphine injection, ondansetron (ZOFRAN) IV, mouth rinse, oxyCODONE, sodium chloride flush  Family History: family history includes Alcohol abuse in her paternal aunt; Aneurysm  in her father; Asthma in her mother; COPD in her brother; Diabetes in her brother and mother; Heart disease in her father and mother.    Social History:   reports that she has quit smoking. Her smoking use included cigarettes. She started smoking about 47 years ago. She has a 36.00 pack-year smoking history. She has never used smokeless tobacco. She reports that she does not drink alcohol and does not use drugs. The patient denies ETOH, tobacco, or drug use.   Review of Systems: Constitutional: Weight is stable.  Eyes: No changes in vision. ENT: No oral lesions, sore throat.  GI: see HPI.  Heme/Lymph: No easy bruising.  CV: No chest pain.  GU: No hematuria.  Integumentary: No rashes.  Neuro: No headaches.  Psych: No depression/anxiety.  Endocrine: No heat/cold intolerance.  Allergic/Immunologic: No urticaria.  Resp: Pos  SOB.  Musculoskeletal: Left leg feeling better, ulcer scabbed   Physical Examination: BP 125/90 (BP Location: Right Arm)   Pulse 74   Temp 98 F (36.7 C)   Resp (!) 39   SpO2 98%  Gen: Tachypnea, NAD, alert and oriented x 3 HEENT: EOMI Neck: supple, no JVD or thyromegaly Chest: CTA bilaterally, no wheezes, crackles, or other adventitious sounds, tachypnea  CV: RRR Abd: soft, mild bilateral lower abdominal  tenderness, ND, +BS in all four quadrants; no HSM, guarding, rigidity, or rebound tenderness Ext: pos edema Skin: L Lower extremity skin ulcer-scabbed; no purulence  Data: Lab Results  Component Value Date   WBC 14.4 (H) 08/14/2021   HGB 11.1 (L) 08/14/2021   HCT 38.4 08/14/2021   MCV 84.6 08/14/2021   PLT 449 (H) 08/14/2021   Recent Labs  Lab 08/13/21 0525 08/14/21 0552  HGB 11.8* 11.1*   Lab Results  Component Value Date   NA 136 08/14/2021   K 6.0 (H) 08/14/2021   CL 102 08/14/2021   CO2 27 08/14/2021   BUN 60 (H) 08/14/2021   CREATININE 4.13 (H) 08/14/2021   GLU 348 03/22/2014   Lab Results  Component Value Date   ALT 9 10/20/2020    AST 8 10/20/2020   ALKPHOS 98 10/20/2020   BILITOT <0.2 10/20/2020   Recent Labs  Lab 08/14/21 0552  APTT 35  INR 1.5*   Assessment/Plan: Ms. Cowie is a 66  y.o. female Patient has a PMH of anxiety, CHF, paroxysmal atrial fibrillation, hypertension, diabetes mellitus with stage IV chronic kidney disease, former tobacco smoking (quit March 2023), COPD, history of CVA , DVT on chronic anticoagulation therapy, lumbar spinal stenosis, and non bleeding gastric ulcer (2018).  Patient admitted 2 days ago to the vascular surgery service for evaluation of atherosclerotic occlusive disease involving bilateral lower extremities and ulceration with left lower extremity.  She is status post peripheral angioplasty and stent placement with successful recannulation of the left lower extremity for limb salvage.    Coffee ground emesis with possible blood Last dose of eliquis on morning of 7/7. She developed nausea, and 3 episodes of vomiting yesterday (7/6) and one episode of vomiting last night, with reports of dark emesis and possible red emesis. No melena reported.  It is possible that she has developed post anesthesia nausea vomiting.  She reports mild nausea intermittent prior to admission but no vomiting or serious heartburn symptoms.  Cholelithiasis seen on KUB study.  She has had no biliary colic post prandial type complaints of pain, and no right upper quadrant pain or isolated tenderness.  She presents on chronic aspirin therapy and Eliquis. DDX inclues gastritis vs gastric erosions/ulcer, MWT, AVM.  Her baseline hemoglobin is 12.8 and she is currently 11.1.  She has remained hemodynamically stable with heart rate and blood pressure.  She has had decompensation in respiratory and kidney status requiring bipap and [place] and is being set up for hemodialysis  Recommendation:  - Supportive care at this time - Continue Use Protonix (pantoprazole) 40 mg IV q 12 hours.  - Antiemetics as needed - NPO today -  IV hydration - Continue Serial CBCs and transfuse PRN  - Eliquis as required by vascular surgery. INR 1.5  - Supportive care at this time.  - No recommendation currently for EGD given decompensated pulmonary status.   - Treatment for clean-based ulcer if present is Protonix twice daily therapy.  2. Lower abdominal tenderness with mild constipation: She denies any abdominal pain on interview, but did have very mild tenderness to palpation generalized lower abdomen. Soft, no guarding.  Suspect secondary to mild constipation.  Hx of narcotic pain medication for back pain and leg pain which can contribute to constipation. Last BM was approx Tues. KUB revealed no evidence of bowel obstruction, moderate volume of stool throughout the colon.  Recommendation: - Laxative of choice per medical team - Monitor response and abdominal pain for resolution.  - She has a history of colon polyps and is due this year for 5-year colonoscopy surveillance. This can be obtained on outpatient basis when she recovers from current illness.  3. Acute respiratory distress:  4. Acute kidney injury: She had worsening hypoxia, tachypnea, and is now on BiPAP.  She has received IV Lasix.  Vital signs were 126/55, 79, 27, SPO2 99% on BiPAP. Chest x-ray shows stable enlarged cardiac silhouette, no acute airspace disease. She has had worsening of renal function with hyperkalemia.   Recommendation: - No recommendation for luminal evaluation at this time. -We will follow treatment response provided by primary team, Hospitalist and Nephrologist already consulted.

## 2021-08-14 NOTE — Assessment & Plan Note (Addendum)
Patient has a history of type 2 diabetes mellitus with complications of stage IV chronic kidney disease. Baseline serum creatinine is 2.56 Keep patient n.p.o. for now Check blood sugars every 4 hours Place patient on Levemir 10 units daily and sliding scale insulin for glycemic control

## 2021-08-14 NOTE — Assessment & Plan Note (Signed)
Patient had an episode of emesis containing blood Concern for possible acute gastritis ??  [place] due to worsening renal failure Place patient on IV Protonix Check serial H&H Transfuse as needed Consult GI Keep patient n.p.o. for now

## 2021-08-14 NOTE — Assessment & Plan Note (Signed)
Patient has a history of atherosclerotic occlusive disease involving bilateral lower extremities with ulceration of the left lower extremity. She is status post successful recanalization of the left lower extremity for limb salvage Continue atorvastatin Hold aspirin due to concerns of upper GI bleed

## 2021-08-14 NOTE — Assessment & Plan Note (Signed)
Patient with worsening shortness of breath and hypoxia currently on BiPAP to reduce work of breathing and improve oxygenation. Received a dose of Lasix 60 mg IV Last known LVEF was 60 to 65% from a 2D echocardiogram done in 2019  Follow-up results of repeat 2D echocardiogram Hold IV fluid Hold losartan and Lasix due to worsening renal Function We will also hold carvedilol since patient is normotensive

## 2021-08-14 NOTE — Progress Notes (Signed)
Patient is status post recent peripheral angioplasty with stent placement and has been on aspirin and apixaban.  Had an episode of emesis containing blood this morning. Discussed with vascular surgeon who agrees with holding aspirin and apixaban for now but recommend starting patient on Plavix if H&H is stable. I have ordered Plavix 75 mg to start at 6 PM if H&H is stable.

## 2021-08-14 NOTE — Progress Notes (Signed)
   08/14/21 0608  Assess: MEWS Score  BP (!) 126/55  MAP (mmHg) 68  Pulse Rate 79  ECG Heart Rate 74  Resp (!) 27  SpO2 99 %  O2 Device Bi-PAP  Assess: MEWS Score  MEWS Temp 0  MEWS Systolic 0  MEWS Pulse 0  MEWS RR 2  MEWS LOC 0  MEWS Score 2  MEWS Score Color Yellow  Assess: if the MEWS score is Yellow or Red  Were vital signs taken at a resting state? Yes  Focused Assessment Change from prior assessment (see assessment flowsheet)  Does the patient meet 2 or more of the SIRS criteria? No  MEWS guidelines implemented *See Row Information* Yes  Treat  MEWS Interventions Consulted Respiratory Therapy;Administered scheduled meds/treatments;Administered prn meds/treatments  Pain Scale 0-10  Pain Score 0  Notify: Charge Nurse/RN  Name of Charge Nurse/RN Notified Ernestina Penna  Date Charge Nurse/RN Notified 08/14/21  Time Charge Nurse/RN Notified [id number]  Notify: Provider  Provider Name/Title Carolee Rota  Date Provider Notified 08/14/21  Time Provider Notified [phone]  Method of Notification Call  Notification Reason Change in status  Provider response See new orders  Date of Provider Response 08/14/21  Time of Provider Response [id number]  Notify: Rapid Response  Name of Rapid Response RN Notified [facility] (ICU charge nurse)  Date Rapid Response Notified 08/14/21  Time Rapid Response Notified [id number]  Assess: SIRS CRITERIA  SIRS Temperature  0  SIRS Pulse 0  SIRS Respirations  1  SIRS WBC 1  SIRS Score Sum  2

## 2021-08-14 NOTE — Assessment & Plan Note (Signed)
Most likely secondary to fluid overload Patient developed worsening shortness of breath and was initially on nasal cannula but is now on BiPAP due to increased work of breathing and hypoxia. We will attempt to wean patient off BiPAP once respiratory status improved

## 2021-08-14 NOTE — Progress Notes (Signed)
[facility]  ROUNDING NOTE   Subjective:   Jocelyn Wells is a 66 year old female with past medical conditions including COPD, depression, diabetes, hypertension, stroke, and chronic kidney disease stage IV.  Patient presents to the hospital for scheduled procedure in vascular lab.  Patient was admitted for Atherosclerosis of native arteries of the extremities with ulceration [facility]) [I70.25]  Patient is known to our practice and is followed by Dr.Korrapati outpatient.  Patient was last seen in office on 07/30/2021.  Patient is seen resting in bed, husband and other family at bedside.  Patient currently on BiPAP.  Chart review reveals patient has shortness of breath overnight that persisted after IV Lasix.  Patient remains on BiPAP awaiting ABG.  According to family, patient has had poor appetite since admission.  Denies NSAID use.  Denies nausea or vomiting.  Denies shortness of breath prior to admission.  Baseline creatinine appears to be 2.5 with GFR 20 on 07/30/2021.Labs taken on arrival indicate creatinine 3.85 with GFR 12.  Patient underwent angiogram with contrast.  Creatinine has continued to rise.  Echo from 06/13/2015 shows EF 40 to 45% with a grade 1 diastolic dysfunction.  Family also states patient was a smoker for multiple years and has some underlying COPD.  Chest x-ray shows some lower lobe infiltrates.  We have been consulted to evaluate and manage acute kidney injury.  Objective:  Vital signs in last 24 hours:  Temp:  [97.8 F (36.6 C)-98.4 F (36.9 C)] 98 F (36.7 C) (07/07 1144) Pulse Rate:  [72-87] 82 (07/07 1254) Resp:  [18-39] 20 (07/07 1254) BP: (111-153)/(54-100) 120/77 (07/07 1144) SpO2:  [84 %-99 %] 92 % (07/07 1254) FiO2 (%):  [40 %] 40 % (07/07 0836)  Weight change:  There were no vitals filed for this visit.  Intake/Output: I/O last 3 completed shifts: In: 1204.6 [P.O.:659; I.V.:545.6] Out: 1450 [Urine:1250; Other:200]   Intake/Output this  shift:  No intake/output data recorded.  Physical Exam: General: Ill-appearing  Head: Normocephalic, atraumatic.  Dry oral mucosal membranes  Eyes: Anicteric  Lungs:  Clear to auscultation, BiPAP  Heart: Regular rate and rhythm  Abdomen:  Soft, nontender, obese  Extremities: Trace peripheral edema.  Neurologic: Nonfocal, moving all four extremities  Skin: No lesions  Access: None    Basic Metabolic Panel: Recent Labs  Lab 08/12/21 1059 08/13/21 1715 08/14/21 0552 08/14/21 0818 08/14/21 1247  NA  --  137 136  --   --   K  --  5.5* 5.8* 6.0* 5.5*  CL  --  100 102  --   --   CO2  --  29 27  --   --   GLUCOSE  --  202* 243*  --   --   BUN 53* 57* 60*  --   --   CREATININE 3.85* 4.07* 4.13*  --   --   CALCIUM  --  8.4* 8.4*  --   --     Liver Function Tests: No results for input(s): "AST", "ALT", "ALKPHOS", "BILITOT", "PROT", "ALBUMIN" in the last 168 hours. No results for input(s): "LIPASE", "AMYLASE" in the last 168 hours. No results for input(s): "AMMONIA" in the last 168 hours.  CBC: Recent Labs  Lab 08/13/21 0525 08/14/21 0552  WBC 14.9* 14.4*  HGB 11.8* 11.1*  HCT 41.0 38.4  MCV 85.8 84.6  PLT 477* 449*    Cardiac Enzymes: No results for input(s): "CKTOTAL", "CKMB", "CKMBINDEX", "TROPONINI" in the last 168 hours.  BNP: Invalid input(s): "  POCBNP"  CBG: Recent Labs  Lab 08/13/21 2014 08/14/21 0542 08/14/21 0727 08/14/21 0933 08/14/21 1144  GLUCAP 240* 236* 239* 301* 236*    Microbiology: Results for orders placed or performed in visit on 07/29/21  Aerobic culture     Status: None   Collection Time: 07/29/21 10:45 AM  Result Value Ref Range Status   Aerobic Bacterial Culture Final report  Final   Organism ID, Bacteria Routine flora  Final    Comment: Scant growth    Coagulation Studies: Recent Labs    08/14/21 0552  LABPROT 18.3*  INR 1.5*    Urinalysis: No results for input(s): "COLORURINE", "LABSPEC", "PHURINE", "GLUCOSEU",  "HGBUR", "BILIRUBINUR", "KETONESUR", "PROTEINUR", "UROBILINOGEN", "NITRITE", "LEUKOCYTESUR" in the last 72 hours.  Invalid input(s): "APPERANCEUR"    Imaging: DG Chest Port 1 View  Result Date: 08/14/2021 CLINICAL DATA:  Shortness of breath EXAM: PORTABLE CHEST 1 VIEW COMPARISON:  08/13/2021 FINDINGS: Chronic cardiomegaly. Chronic interstitial coarsening/airway thickening. Streaky density in the right lower lung. Milder opacity at the level of the lingula. No effusion or pneumothorax. IMPRESSION: 1. Lower lobe infiltrates, greater on the right. 2. Chronic cardiomegaly and airway thickening. Electronically Signed   By: Jorje Guild M.D.   On: 08/14/2021 06:22   DG Chest 2 View  Result Date: 08/13/2021 CLINICAL DATA:  Vomiting EXAM: CHEST - 2 VIEW COMPARISON:  07/14/2021 FINDINGS: Frontal and lateral views of the chest demonstrates stable enlargement the cardiac silhouette. Loop recorder left anterior chest unchanged. No acute airspace disease, effusion, or pneumothorax. No acute bony abnormalities. IMPRESSION: 1. Stable enlarged cardiac silhouette. 2. No acute airspace disease. Electronically Signed   By: Randa Ngo M.D.   On: 08/13/2021 16:06   DG Abd 1 View  Result Date: 08/13/2021 CLINICAL DATA:  Vomiting EXAM: ABDOMEN - 1 VIEW COMPARISON:  None Available. FINDINGS: No dilated large or small bowel. Moderate volume stool throughout the colon. Gas the rectum. Several small gallstones noted. Degenerative changes of the spine. IMPRESSION: 1. No evidence of bowel obstruction. 2. Moderate volume stool throughout the colon. 3. Cholelithiasis. Electronically Signed   By: Suzy Bouchard M.D.   On: 08/13/2021 16:06     Medications:    sodium chloride     sodium chloride Stopped (08/14/21 0545)    atorvastatin  40 mg Oral Daily   clopidogrel  75 mg Oral Daily   insulin aspart  0-15 Units Subcutaneous Q4H   insulin detemir  10 Units Subcutaneous Daily   mouth rinse  15 mL Mouth Rinse 4 times  per day   pantoprazole (PROTONIX) IV  40 mg Intravenous Q12H   PARoxetine  40 mg Oral Daily   sodium chloride flush  3 mL Intravenous Q12H   sodium chloride, acetaminophen, albuterol, metoCLOPramide (REGLAN) injection, morphine injection, ondansetron (ZOFRAN) IV, mouth rinse, oxyCODONE, sodium chloride flush  Assessment/ Plan:  Jocelyn Wells is a 66 y.o.  female with past medical conditions including COPD, depression, diabetes, hypertension, stroke, and chronic kidney disease stage IV.  Patient presents to the hospital for scheduled procedure in vascular lab.  Patient was admitted for Atherosclerosis of native arteries of the extremities with ulceration (HCC) [I70.25]   Acute Kidney Injury on chronic kidney disease stage IV with baseline creatinine 2.5 and GFR of 20 on 07/30/21.  Acute kidney injury secondary to multiple factors including IV contrast and cardiorenal Chronic kidney disease is secondary to diabetes and hypertension Losartan currently held along with diuretics.  IV contrast given during angiogram.  Renal  ultrasound ordered.  Patient has multiple factors at play resulting in current kidney failure.  Patient has been given multiple doses of IV furosemide without consistent relief of symptoms.  An IV furosemide drip could be effective however will worsen current kidney failure.  At this time, we feel initiating renal replacement therapy would offer the most benefit.  Discussed this with patient and family, and patient is agreeable.  We will consult vascular for placement of PermCath and initiate treatment tomorrow.  Discussed with family that the need for ongoing dialysis is unknown at this time.  Lab Results  Component Value Date   CREATININE 4.13 (H) 08/14/2021   CREATININE 4.07 (H) 08/13/2021   CREATININE 3.85 (H) 08/12/2021    Intake/Output Summary (Last 24 hours) at 08/14/2021 1433 Last data filed at 08/14/2021 0728 Gross per 24 hour  Intake 738.11 ml  Output 800 ml  Net  -61.89 ml   2. Anemia of chronic kidney disease Lab Results  Component Value Date   HGB 11.1 (L) 08/14/2021    Hemoglobin remains acceptable.  We will continue to monitor  3. Secondary Hyperparathyroidism: with outpatient labs: PTH 187 on 07/30/21.   Lab Results  Component Value Date   CALCIUM 8.4 (L) 08/14/2021   CAION 1.08 (L) 01/29/2015    Calcium remains within acceptable range.  We will continue to monitor  4.  Hypertension with chronic kidney disease.  Home regimen includes torsemide, carvedilol, hydrochlorothiazide, and losartan.All currently held at this time  5. Diabetes mellitus type II with chronic kidney disease: insulin dependent. Home regimen includes NovoLog with an insulin pump. Most recent hemoglobin A1c is 7.9 on 07/14/21.     LOS: 2   7/7/20232:33 PM

## 2021-08-14 NOTE — Progress Notes (Signed)
Pt started on sodium chloride 0.9 % at 75 ml/ hr .Notice pt increase RR from 22-25. Pt oxygen sat is at 90 % on 3 liters bumped it up to 4 liters oxygen now at 92 % on 4 liters. Pt have clear diminished lung sounds and + 1 edema on bilateral legs. MD Schnier notified and ordered to continue the same rate at this time. Will continue to monitor.

## 2021-08-15 DIAGNOSIS — E875 Hyperkalemia: Secondary | ICD-10-CM

## 2021-08-15 DIAGNOSIS — Z992 Dependence on renal dialysis: Secondary | ICD-10-CM

## 2021-08-15 DIAGNOSIS — N186 End stage renal disease: Secondary | ICD-10-CM

## 2021-08-15 DIAGNOSIS — I5033 Acute on chronic diastolic (congestive) heart failure: Secondary | ICD-10-CM | POA: Diagnosis not present

## 2021-08-15 DIAGNOSIS — J9601 Acute respiratory failure with hypoxia: Secondary | ICD-10-CM

## 2021-08-15 LAB — POTASSIUM
Potassium: 5.4 mmol/L — ABNORMAL HIGH (ref 3.5–5.1)
Potassium: 5.3 mmol/L — ABNORMAL HIGH (ref 3.5–5.1)
Potassium: 4.5 mmol/L (ref 3.5–5.1)

## 2021-08-15 LAB — GLUCOSE, CAPILLARY
Glucose-Capillary: 102 mg/dL — ABNORMAL HIGH (ref 70–99)
Glucose-Capillary: 178 mg/dL — ABNORMAL HIGH (ref 70–99)
Glucose-Capillary: 211 mg/dL — ABNORMAL HIGH (ref 70–99)
Glucose-Capillary: 274 mg/dL — ABNORMAL HIGH (ref 70–99)
Glucose-Capillary: 195 mg/dL — ABNORMAL HIGH (ref 70–99)

## 2021-08-15 LAB — HEMOGLOBIN AND HEMATOCRIT, BLOOD
HCT: 35.3 % — ABNORMAL LOW (ref 36.0–46.0)
HCT: 35.8 % — ABNORMAL LOW (ref 36.0–46.0)
HCT: 36.3 % (ref 36.0–46.0)
Hemoglobin: 10.6 g/dL — ABNORMAL LOW (ref 12.0–15.0)
Hemoglobin: 10.8 g/dL — ABNORMAL LOW (ref 12.0–15.0)
Hemoglobin: 10.8 g/dL — ABNORMAL LOW (ref 12.0–15.0)

## 2021-08-15 LAB — HEPATITIS B SURFACE ANTIBODY, QUANTITATIVE: Hep B S AB Quant (Post): 30.6 m[IU]/mL (ref >9.9)

## 2021-08-15 MED ORDER — INSULIN ASPART 100 UNIT/ML IJ SOLN
0.0000 [IU] | Freq: Three times a day (TID) | INTRAMUSCULAR | Status: DC
  Administered 2021-08-15: 5 [IU] via SUBCUTANEOUS
  Administered 2021-08-16 – 2021-08-18 (×7): 3 [IU] via SUBCUTANEOUS
  Administered 2021-08-18: 2 [IU] via SUBCUTANEOUS
  Administered 2021-08-18 – 2021-08-19 (×2): 5 [IU] via SUBCUTANEOUS
  Administered 2021-08-19 – 2021-08-20 (×3): 3 [IU] via SUBCUTANEOUS
  Administered 2021-08-20: 5 [IU] via SUBCUTANEOUS
  Administered 2021-08-20: 7 [IU] via SUBCUTANEOUS
  Administered 2021-08-21: 5 [IU] via SUBCUTANEOUS
  Administered 2021-08-21: 9 [IU] via SUBCUTANEOUS
  Administered 2021-08-21: 3 [IU] via SUBCUTANEOUS
  Administered 2021-08-22: 9 [IU] via SUBCUTANEOUS
  Administered 2021-08-22: 7 [IU] via SUBCUTANEOUS
  Administered 2021-08-22 – 2021-08-23 (×2): 3 [IU] via SUBCUTANEOUS
  Filled 2021-08-15 (×23): qty 1

## 2021-08-15 NOTE — Progress Notes (Signed)
Treatment Date: 08/15/2021   Treatment Time: 2 hours    Access: Right femoral CVC   UF Removed: 1 ml   Next Treatment: 08/17/2021   Note:   HD treatment completed. Tolerated well, no adverse effects noted or reported. Patient hemodynamically stable. Report given to floor nurse Christmas, Clinton Sawyer, RN.

## 2021-08-15 NOTE — Progress Notes (Addendum)
[facility]  ROUNDING NOTE   Subjective:   Jocelyn Wells is a 66 year old female with past medical conditions including COPD, depression, diabetes, hypertension, stroke, and chronic kidney disease stage IV.  Patient presents to the hospital for scheduled procedure in vascular lab.  Patient was admitted for Atherosclerosis of native arteries of the extremities with ulceration [facility]) [I70.25]  Patient is known to our practice and is followed by Dr.Korrapati outpatient.  Patient was last seen in office on 07/30/2021.  Patient is seen resting in bed, husband and other family at bedside.  Patient currently on BiPAP.  Chart review reveals patient has shortness of breath overnight that persisted after IV Lasix.  Patient remains on BiPAP awaiting ABG.  According to family, patient has had poor appetite since admission.  Denies NSAID use.  Denies nausea or vomiting.  Denies shortness of breath prior to admission.   Patient was seen today on second floor. I discussed with the patient about need for renal replacement therapy. Patient was understanding   Objective:  Vital signs in last 24 hours:  Temp:  [98 F (36.7 C)-99.7 F (37.6 C)] 99.5 F (37.5 C) (07/08 0400) Pulse Rate:  [74-87] 80 (07/08 0400) Resp:  [18-39] 20 (07/08 0400) BP: (120-154)/(55-90) 136/70 (07/08 0400) SpO2:  [90 %-99 %] 94 % (07/08 0400) FiO2 (%):  [40 %] 40 % (07/07 0836)  Weight change:  There were no vitals filed for this visit.  Intake/Output: I/O last 3 completed shifts: In: 1610 [P.O.:1019; I.V.:555] Out: 1250 [Urine:1250]   Intake/Output this shift:  No intake/output data recorded.  Physical Exam: General: Ill-appearing  Head: Normocephalic, atraumatic.  Dry oral mucosal membranes  Eyes: Anicteric  Lungs:  Clear to auscultation, BiPAP  Heart: Regular rate and rhythm  Abdomen:  Soft, nontender, obese  Extremities: Trace peripheral edema.  Neurologic: Nonfocal, moving all four extremities   Skin: No lesions  Access: Temporary catheter in situ    Basic Metabolic Panel: Recent Labs  Lab 08/12/21 1059 08/13/21 1715 08/13/21 1715 08/14/21 0552 08/14/21 0818 08/14/21 1247 08/14/21 1819 08/14/21 2027 08/14/21 2325 08/15/21 0412  NA  --  137  --  136  --   --   --   --   --   --   K  --  5.5*   < > 5.8*   < > 5.5* 5.5* 5.9* 5.3* 5.4*  CL  --  100  --  102  --   --   --   --   --   --   CO2  --  29  --  27  --   --   --   --   --   --   GLUCOSE  --  202*  --  243*  --   --   --   --   --   --   BUN 53* 57*  --  60*  --   --   --   --   --   --   CREATININE 3.85* 4.07*  --  4.13*  --   --   --   --   --   --   CALCIUM  --  8.4*  --  8.4*  --   --   --   --   --   --    < > = values in this interval not displayed.    Liver Function Tests: No results for input(s): "AST", "ALT", "ALKPHOS", "BILITOT", "PROT", "  ALBUMIN" in the last 168 hours. No results for input(s): "LIPASE", "AMYLASE" in the last 168 hours. No results for input(s): "AMMONIA" in the last 168 hours.  CBC: Recent Labs  Lab 08/13/21 0525 08/14/21 0552 08/14/21 1819 08/14/21 2325  WBC 14.9* 14.4*  --   --   HGB 11.8* 11.1* 10.7* 10.5*  HCT 41.0 38.4 36.5 35.3*  MCV 85.8 84.6  --   --   PLT 477* 449*  --   --     Cardiac Enzymes: No results for input(s): "CKTOTAL", "CKMB", "CKMBINDEX", "TROPONINI" in the last 168 hours.  BNP: Invalid input(s): "POCBNP"  CBG: Recent Labs  Lab 08/14/21 0933 08/14/21 1144 08/14/21 2138 08/14/21 2345 08/15/21 0318  GLUCAP 301* 236* 316* 240* 102*    Microbiology: Results for orders placed or performed in visit on 07/29/21  Aerobic culture     Status: None   Collection Time: 07/29/21 10:45 AM  Result Value Ref Range Status   Aerobic Bacterial Culture Final report  Final   Organism ID, Bacteria Routine flora  Final    Comment: Scant growth    Coagulation Studies: Recent Labs    08/14/21 0552  LABPROT 18.3*  INR 1.5*    Urinalysis: No results  for input(s): "COLORURINE", "LABSPEC", "PHURINE", "GLUCOSEU", "HGBUR", "BILIRUBINUR", "KETONESUR", "PROTEINUR", "UROBILINOGEN", "NITRITE", "LEUKOCYTESUR" in the last 72 hours.  Invalid input(s): "APPERANCEUR"    Imaging: US RENAL  Result Date: 08/14/2021 CLINICAL DATA:  Acute kidney injury EXAM: RENAL / URINARY TRACT ULTRASOUND COMPLETE COMPARISON:  04/07/2021 ultrasound FINDINGS: Right Kidney: Renal measurements: 11.4 x 5.5 x 4.9 cm = volume: 159 mL. Cortex is echogenic. No hydronephrosis. Subcentimeter 7 mm hypoechoic area at the upper pole. Left Kidney: Renal measurements: 11.3 x 5.5 x 5.3 cm = volume: 171 mL. Cortex is echogenic. No mass or hydronephrosis Bladder: Appears normal for degree of bladder distention. Other: None. IMPRESSION: 1. Slightly echogenic cortex consistent with medical renal disease. No hydronephrosis 2. Subcentimeter hypoechoic focus at the upper pole right kidney, probably a tiny cyst but small size limits further assessment. Suggest six-month ultrasound follow-up Electronically Signed   By: Donavan Foil M.D.   On: 08/14/2021 17:52   PERIPHERAL VASCULAR CATHETERIZATION  Result Date: 08/14/2021 See surgical note for result.  DG Chest Port 1 View  Result Date: 08/14/2021 CLINICAL DATA:  Shortness of breath EXAM: PORTABLE CHEST 1 VIEW COMPARISON:  08/13/2021 FINDINGS: Chronic cardiomegaly. Chronic interstitial coarsening/airway thickening. Streaky density in the right lower lung. Milder opacity at the level of the lingula. No effusion or pneumothorax. IMPRESSION: 1. Lower lobe infiltrates, greater on the right. 2. Chronic cardiomegaly and airway thickening. Electronically Signed   By: Jorje Guild M.D.   On: 08/14/2021 06:22   DG Chest 2 View  Result Date: 08/13/2021 CLINICAL DATA:  Vomiting EXAM: CHEST - 2 VIEW COMPARISON:  07/14/2021 FINDINGS: Frontal and lateral views of the chest demonstrates stable enlargement the cardiac silhouette. Loop recorder left anterior chest  unchanged. No acute airspace disease, effusion, or pneumothorax. No acute bony abnormalities. IMPRESSION: 1. Stable enlarged cardiac silhouette. 2. No acute airspace disease. Electronically Signed   By: Randa Ngo M.D.   On: 08/13/2021 16:06   DG Abd 1 View  Result Date: 08/13/2021 CLINICAL DATA:  Vomiting EXAM: ABDOMEN - 1 VIEW COMPARISON:  None Available. FINDINGS: No dilated large or small bowel. Moderate volume stool throughout the colon. Gas the rectum. Several small gallstones noted. Degenerative changes of the spine. IMPRESSION: 1. No evidence of bowel obstruction.  2. Moderate volume stool throughout the colon. 3. Cholelithiasis. Electronically Signed   By: Suzy Bouchard M.D.   On: 08/13/2021 16:06     Medications:    sodium chloride     sodium chloride Stopped (08/14/21 0545)   sodium chloride      ceFAZolin (ANCEF) IV      atorvastatin  40 mg Oral Daily   Chlorhexidine Gluconate Cloth  6 each Topical Q0600   clopidogrel  75 mg Oral Daily   insulin aspart  0-15 Units Subcutaneous Q4H   insulin detemir  10 Units Subcutaneous Daily   mouth rinse  15 mL Mouth Rinse 4 times per day   pantoprazole (PROTONIX) IV  40 mg Intravenous Q12H   PARoxetine  40 mg Oral Daily   sodium chloride flush  3 mL Intravenous Q12H   sodium chloride, acetaminophen, albuterol, alteplase, diphenhydrAMINE, famotidine, heparin, HYDROmorphone (DILAUDID) injection, lidocaine (PF), lidocaine-prilocaine, methylPREDNISolone (SOLU-MEDROL) injection, metoCLOPramide (REGLAN) injection, midazolam, morphine injection, ondansetron (ZOFRAN) IV, ondansetron (ZOFRAN) IV, mouth rinse, oxyCODONE, pentafluoroprop-tetrafluoroeth, sodium chloride flush  Assessment/ Plan:  Jocelyn Wells is a 66 y.o.  female with past medical conditions including COPD, depression, diabetes, hypertension, stroke, and chronic kidney disease stage IV.  Patient presents to the hospital for scheduled procedure in vascular lab.  Patient was  admitted for Atherosclerosis of native arteries of the extremities with ulceration (HCC) [I70.25]   Acute Kidney Injury on chronic kidney disease stage IV with baseline creatinine 2.5 and GFR of 20 on 07/30/21.  Acute kidney injury secondary to multiple factors including IV contrast and cardiorenal Chronic kidney disease is secondary to diabetes and hypertension Losartan currently held along with diuretics.  IV contrast given during angiogram.  Renal ultrasound ordered.  Patient has multiple factors at play resulting in current kidney failure.  Patient has been given multiple doses of IV furosemide without consistent relief of symptoms.  An IV furosemide drip could be effective however will worsen current kidney failure.  At this time, we feel initiating renal replacement therapy would offer the most benefit.  Discussed this with patient and family, and patient is agreeable.  We will consult vascular for placement of PermCath and initiate treatment tomorrow.  Discussed with family that the need for ongoing dialysis is unknown at this time.  Patient AKI was worsening secondary to multiple etiologies Patient did receive IV contrast/patient did receive IV diuretics Patient does have a baseline CKD stage IV Patient remained on BiPAP Patient was having moderate uremic symptoms of decreased appetite. Patient creatinine had increased to 4.13/GFR was at 11 mL/min. Patient then had temporary catheter placed yesterday Patient was initiated on renal replacement therapy On August 15, 2021 We did discuss with the patient's family about not sure whether this is AKI/or now ESRD because patient's low kidney reserve to begin with  Patient remains hyperkalemic We will dialyze patient  today    Lab Results  Component Value Date   CREATININE 4.13 (H) 08/14/2021   CREATININE 4.07 (H) 08/13/2021   CREATININE 3.85 (H) 08/12/2021    Intake/Output Summary (Last 24 hours) at 08/15/2021 0607 Last data filed at  08/14/2021 1849 Gross per 24 hour  Intake 595.91 ml  Output 0 ml  Net 595.91 ml   2. Anemia of chronic kidney disease Lab Results  Component Value Date   HGB 10.5 (L) 08/14/2021    Hemoglobin remains acceptable.  We will continue to monitor  3. Secondary Hyperparathyroidism: with outpatient labs: PTH 187 on 07/30/21.  Lab Results  Component Value Date   CALCIUM 8.4 (L) 08/14/2021   CAION 1.08 (L) 01/29/2015    Calcium remains within acceptable range.  We will continue to monitor  4.  Hypertension with chronic kidney disease.  Home regimen includes torsemide, carvedilol, hydrochlorothiazide, and losartan.All currently held at this time  5. Diabetes mellitus type II with chronic kidney disease: insulin dependent. Home regimen includes NovoLog with an insulin pump. Most recent hemoglobin A1c is 7.9 on 07/14/21.    6. Hyperkalemia We will dialyze patient today with low K bath that should help.    Addendum Patient was seen on dialysis Patient tolerating treatment well    LOS: 3 Juel Ripley s [facility] 7/8/20236:07 AM

## 2021-08-15 NOTE — Progress Notes (Signed)
Inpatient Follow-up/Progress Note   Patient ID: Jocelyn Wells is a 66 y.o. female.  Overnight Events / Subjective Findings Hgb stable. Underwent dialysis today. Family at bedside. Pt much more alert today. Feeling better. Tolerating some po. No signs of gib No other acute gi complaints.  Review of Systems  Constitutional:  Negative for unexpected weight change.  HENT:  Negative for trouble swallowing.   Cardiovascular:  Negative for chest pain.  Gastrointestinal:  Negative for abdominal pain, blood in stool, constipation, diarrhea, nausea and vomiting.  Skin:  Negative for color change and pallor.    Medications  Current Facility-Administered Medications:    0.9 %  sodium chloride infusion, 250 mL, Intravenous, PRN, Schnier, Dolores Lory, MD   0.9 %  sodium chloride infusion, , Intravenous, Continuous, Eulogio Ditch E, NP   acetaminophen (TYLENOL) tablet 650 mg, 650 mg, Oral, Q4H PRN, Schnier, Dolores Lory, MD   albuterol (PROVENTIL) (2.5 MG/3ML) 0.083% nebulizer solution 2.5 mg, 2.5 mg, Nebulization, Q4H PRN, Schnier, Dolores Lory, MD   atorvastatin (LIPITOR) tablet 40 mg, 40 mg, Oral, Daily, Schnier, Dolores Lory, MD, 40 mg at 08/15/21 1244   ceFAZolin (ANCEF) IVPB 2g/100 mL premix, 2 g, Intravenous, 30 min Pre-Op, Eulogio Ditch E, NP   Chlorhexidine Gluconate Cloth 2 % PADS 6 each, 6 each, Topical, Q0600, Colon Flattery, NP, 6 each at 08/15/21 0536   clopidogrel (PLAVIX) tablet 75 mg, 75 mg, Oral, Daily, Agbata, Tochukwu, MD, 75 mg at 08/15/21 1244   diphenhydrAMINE (BENADRYL) injection 50 mg, 50 mg, Intravenous, Once PRN, Kris Hartmann, NP   famotidine (PEPCID) tablet 40 mg, 40 mg, Oral, Once PRN, Kris Hartmann, NP   HYDROmorphone (DILAUDID) injection 1 mg, 1 mg, Intravenous, Once PRN, Eulogio Ditch E, NP   insulin aspart (novoLOG) injection 0-9 Units, 0-9 Units, Subcutaneous, TID WC, Zhang, Dekui, MD   insulin detemir (LEVEMIR) injection 10 Units, 10 Units, Subcutaneous, Daily,  Agbata, Tochukwu, MD, 10 Units at 08/15/21 1244   metoCLOPramide (REGLAN) injection 10 mg, 10 mg, Intravenous, Q6H PRN, Agbata, Tochukwu, MD   midazolam (VERSED) 2 MG/ML syrup 8 mg, 8 mg, Oral, Once PRN, Eulogio Ditch E, NP   morphine (PF) 4 MG/ML injection 2 mg, 2 mg, Intravenous, Q1H PRN, Schnier, Dolores Lory, MD   ondansetron [facility]) injection 4 mg, 4 mg, Intravenous, Q6H PRN, Schnier, Dolores Lory, MD, 4 mg at 08/14/21 0555   ondansetron (ZOFRAN) injection 4 mg, 4 mg, Intravenous, Q6H PRN, Kris Hartmann, NP   Oral care mouth rinse, 15 mL, Mouth Rinse, 4 times per day, Schnier, Dolores Lory, MD, 15 mL at 08/14/21 2201   Oral care mouth rinse, 15 mL, Mouth Rinse, PRN, Schnier, Dolores Lory, MD   oxyCODONE (Oxy IR/ROXICODONE) immediate release tablet 5-10 mg, 5-10 mg, Oral, Q4H PRN, Schnier, Dolores Lory, MD, 5 mg at 08/14/21 0249   pantoprazole (PROTONIX) injection 40 mg, 40 mg, Intravenous, Q12H, Agbata, Tochukwu, MD, 40 mg at 08/15/21 1243   PARoxetine (PAXIL) tablet 40 mg, 40 mg, Oral, Daily, Schnier, Dolores Lory, MD, 40 mg at 08/15/21 1244   sodium chloride flush (NS) 0.9 % injection 3 mL, 3 mL, Intravenous, Q12H, Schnier, Dolores Lory, MD, 3 mL at 08/14/21 2158   sodium chloride flush (NS) 0.9 % injection 3 mL, 3 mL, Intravenous, PRN, Schnier, Dolores Lory, MD  sodium chloride     sodium chloride      ceFAZolin (ANCEF) IV      sodium chloride, acetaminophen, albuterol, diphenhydrAMINE, famotidine,  HYDROmorphone (DILAUDID) injection, metoCLOPramide (REGLAN) injection, midazolam, morphine injection, ondansetron (ZOFRAN) IV, ondansetron (ZOFRAN) IV, mouth rinse, oxyCODONE, sodium chloride flush   Objective    Vitals:   08/15/21 1149 08/15/21 1157 08/15/21 1222 08/15/21 1557  BP:   138/82 (!) 153/80  Pulse: 84  93 87  Resp: (!) 33  20 16  Temp: 99.1 F (37.3 C)  98.4 F (36.9 C) 98 F (36.7 C)  TempSrc: Oral     SpO2:   96% 94%  Weight:  108.5 kg       Physical Exam Vitals and nursing note  reviewed.  Constitutional:      General: She is not in acute distress.    Appearance: She is ill-appearing. She is not toxic-appearing or diaphoretic.  HENT:     Head: Normocephalic and atraumatic.     Nose: Nose normal.     Mouth/Throat:     Mouth: Mucous membranes are moist.     Pharynx: Oropharynx is clear.  Eyes:     General: No scleral icterus.    Extraocular Movements: Extraocular movements intact.  Cardiovascular:     Rate and Rhythm: Normal rate and regular rhythm.  Pulmonary:     Effort: No respiratory distress.     Breath sounds: No wheezing, rhonchi or rales.     Comments: diminished Abdominal:     General: There is no distension.     Palpations: Abdomen is soft.     Tenderness: There is no abdominal tenderness. There is no guarding or rebound.  Musculoskeletal:     Cervical back: Neck supple.     Right lower leg: Edema present.     Left lower leg: Edema present.  Skin:    General: Skin is warm and dry.     Coloration: Skin is not jaundiced or pale.  Neurological:     General: No focal deficit present.     Mental Status: She is alert and oriented to person, place, and time. Mental status is at baseline.      Laboratory Data Recent Labs  Lab 08/13/21 0525 08/14/21 0552 08/14/21 1819 08/14/21 2325 08/15/21 0732 08/15/21 1530  WBC 14.9* 14.4*  --   --   --   --   HGB 11.8* 11.1*   < > 10.5* 10.6* 10.8*  HCT 41.0 38.4   < > 35.3* 35.3* 35.8*  PLT 477* 449*  --   --   --   --    < > = values in this interval not displayed.   Recent Labs  Lab 08/12/21 1059 08/13/21 1715 08/13/21 1715 08/14/21 0552 08/14/21 0818 08/15/21 0412 08/15/21 0732 08/15/21 1243  NA  --  137  --  136  --   --   --   --   K  --  5.5*   < > 5.8*   < > 5.4* 5.3* 4.5  CL  --  100  --  102  --   --   --   --   CO2  --  29  --  27  --   --   --   --   BUN 53* 57*  --  60*  --   --   --   --   CREATININE 3.85* 4.07*  --  4.13*  --   --   --   --   CALCIUM  --  8.4*  --  8.4*   --   --   --   --  GLUCOSE  --  202*  --  243*  --   --   --   --    < > = values in this interval not displayed.   Recent Labs  Lab 08/14/21 0552  INR 1.5*      Imaging Studies: US RENAL  Result Date: 08/14/2021 CLINICAL DATA:  Acute kidney injury EXAM: RENAL / URINARY TRACT ULTRASOUND COMPLETE COMPARISON:  04/07/2021 ultrasound FINDINGS: Right Kidney: Renal measurements: 11.4 x 5.5 x 4.9 cm = volume: 159 mL. Cortex is echogenic. No hydronephrosis. Subcentimeter 7 mm hypoechoic area at the upper pole. Left Kidney: Renal measurements: 11.3 x 5.5 x 5.3 cm = volume: 171 mL. Cortex is echogenic. No mass or hydronephrosis Bladder: Appears normal for degree of bladder distention. Other: None. IMPRESSION: 1. Slightly echogenic cortex consistent with medical renal disease. No hydronephrosis 2. Subcentimeter hypoechoic focus at the upper pole right kidney, probably a tiny cyst but small size limits further assessment. Suggest six-month ultrasound follow-up Electronically Signed   By: Donavan Foil M.D.   On: 08/14/2021 17:52   PERIPHERAL VASCULAR CATHETERIZATION  Result Date: 08/14/2021 See surgical note for result.  DG Chest Port 1 View  Result Date: 08/14/2021 CLINICAL DATA:  Shortness of breath EXAM: PORTABLE CHEST 1 VIEW COMPARISON:  08/13/2021 FINDINGS: Chronic cardiomegaly. Chronic interstitial coarsening/airway thickening. Streaky density in the right lower lung. Milder opacity at the level of the lingula. No effusion or pneumothorax. IMPRESSION: 1. Lower lobe infiltrates, greater on the right. 2. Chronic cardiomegaly and airway thickening. Electronically Signed   By: Jorje Guild M.D.   On: 08/14/2021 06:22    Assessment:   # coffee ground emesis - resolved  # anemia-  - 11.1 from 12.8. stable at 10.8 today - no signs of gib outside of vomiting episode - ddx includes gastritis, ulcer, MWT, avm.   # constipation  # AKI on CKD- started on HD  # uremia- resolving with HD  #  acute on chronic hypoxic respiratory failure- stable on 4LNC  # afib- on oac- currently being held  # atherosclerotic occlusive disease involving bilateral lower extremities and ulceration  left lower extremity s/p antioplasty, stent placement and revascularization  # h/o cva  # h/o dvt  # 2018- non bleeding gastric ulcer- h pylori negative  Plan:  Advance diet as tolerated Hgb stable. Continue to monitor; transfusion and resuscitation as per primary team Protonix 40 mg by mouth twice daily for 8 weeks  No endoscopic eval recommended at this time. Recommend outpatient follow up Anti plt and anti coag as per primary team and vascular surgery Recommend continued bowel regimen  Supportive care at this time including anti emetics as per primary team  GI to sign off available as needed  I personally performed the service.  Management of other medical comorbidities as per primary team  Thank you for allowing [place] to participate in this patient's care. Please don't hesitate to call if any questions or concerns arise.   Annamaria Helling, DO [facility]  Portions of the record may have been created with voice recognition software. Occasional wrong-word or 'sound-a-like' substitutions may have occurred due to the inherent limitations of voice recognition software.  Read the chart carefully and recognize, using context, where substitutions may have occurred.

## 2021-08-15 NOTE — Progress Notes (Signed)
  Progress Note   Patient: Jocelyn Wells KCL:[id number] DOB: 06-22-1955 DOA: 08/12/2021     3 DOS: the patient was seen and examined on 08/15/2021   Brief hospital course: TIFFONY KITE is a 66 y.o. 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 Patient is status post peripheral angioplasty with successful recannulization of the left lower extremity for limb salvage.  Patient also had a worsening renal function, developed volume overload, hyperkalemia, acute respiratory failure. Temporary HD catheter was placed in the right femoral artery, dialyzed on 7/8.  Assessment and Plan: Acute respiratory failure with hypoxemia secondary to volume overload. End-stage renal disease with volume overload. Hyperkalemia Acute on chronic diastolic congestive heart failure. Patient now has a temporary dialysis catheter placed, received the first treatment with HD. Patient already feeling better today. Continue wean oxygen.  Peripheral arterial disease status postintervention. Patient currently on atorvastatin.  Coffee-ground emesis. Seen by GI, continue IV Protonix.  No recurrence.  Type 2 diabetes. Continue Levemir and sliding scale insulin      Subjective:  Patient still requiring 2 L oxygen, otherwise has improved.  No short of breath.  Still making urine  Physical Exam: Vitals:   08/15/21 1144 08/15/21 1149 08/15/21 1157 08/15/21 1222  BP: (!) 147/67   138/82  Pulse: 83 84  93  Resp: (!) 23 (!)  33  20  Temp:  99.1 F (37.3 C)  98.4 F (36.9 C)  TempSrc:  Oral    SpO2:    96%  Weight:   108.5 kg    General exam: Appears calm and comfortable  Respiratory system: Crackles in the base. Respiratory effort normal. Cardiovascular system: S1 & S2 heard, RRR. No JVD, murmurs, rubs, gallops or clicks. No pedal edema. Gastrointestinal system: Abdomen is nondistended, soft and nontender. No organomegaly or masses felt. Normal bowel sounds heard. Central nervous system: Alert and oriented. No focal neurological deficits. Extremities: Symmetric 5 x 5 power. Skin: No rashes, lesions or ulcers Psychiatry: Judgement and insight appear normal. Mood & affect appropriate.  ' Data Reviewed:  Lab results reviewed  Family Communication: Daughter at the bedside updated.  Disposition: Status is: Inpatient Remains inpatient appropriate because: Severity of disease.  Planned Discharge Destination: Home with Home Health    Time spent: 35 minutes  Author: Sharen Hones, MD 08/15/2021 3:02 PM  For on call review [email or web address].

## 2021-08-15 NOTE — Progress Notes (Signed)
[facility]  Daily Progress Note   Subjective  -   Patient resting with family at bedside.  No significant shortness of breath currently  Objective Vitals:   08/14/21 1641 08/14/21 1754 08/14/21 1944 08/15/21 0000  BP: 133/61 (!) 151/67 (!) 146/59 (!) 154/63  Pulse: 78 84 87 84  Resp: (!) '21 18 20 '$ (!) 24  Temp:  98 F (36.7 C) 99.7 F (37.6 C) 99 F (37.2 C)  TempSrc:   Oral Oral  SpO2: 92% 94% 93% 94%    Intake/Output Summary (Last 24 hours) at 08/15/2021 0134 Last data filed at 08/14/2021 1849 Gross per 24 hour  Intake 938.65 ml  Output 0 ml  Net 938.65 ml    PULM  CTAB CV  RRR VASC  1+ edema bilaterally  Laboratory CBC    Component Value Date/Time   WBC 14.4 (H) 08/14/2021 0552   HGB 10.5 (L) 08/14/2021 2325   HGB 12.8 10/20/2020 1554   HCT 35.3 (L) 08/14/2021 2325   HCT 41.2 10/20/2020 1554   PLT 449 (H) 08/14/2021 0552   PLT 415 10/20/2020 1554    BMET    Component Value Date/Time   NA 136 08/14/2021 0552   NA 141 10/20/2020 1554   K 5.3 (H) 08/14/2021 2325   CL 102 08/14/2021 0552   CO2 27 08/14/2021 0552   GLUCOSE 243 (H) 08/14/2021 0552   BUN 60 (H) 08/14/2021 0552   BUN 37 (H) 10/20/2020 1554   CREATININE 4.13 (H) 08/14/2021 0552   CREATININE 1.37 (H) 03/21/2015 1451   CALCIUM 8.4 (L) 08/14/2021 0552   GFRNONAA 11 (L) 08/14/2021 0552   GFRAA 33 (L) 04/23/2019 1446    Assessment/Planning: POD #2 s/p left lower extremity angiogram  Patient currently appears to be breathing better following Lasix and BiPAP initiation earlier today.  In consultation with nephrology it was felt that it would be be in the patient's best interest to have a temp cath placed today for dialysis.  We discussed the procedure as well as risk benefits and alternatives.  Patient is agreeable to proceed.  We also discussed with the family that the likelihood of continued dialysis is unknown at this time.   Kris Hartmann  08/15/2021, 1:34 AM

## 2021-08-15 NOTE — Progress Notes (Signed)
BIPAP on standby. Was initiated during 08/14/21 AM Rapid Response at 0555 for fluid overload. PT currently stable and in NARD or SOB at this time while on 4L [place].

## 2021-08-15 NOTE — Hospital Course (Addendum)
Jocelyn Wells is a 66 y.o. 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 Patient is status post peripheral angioplasty with successful recannulization of the left lower extremity for limb salvage.  Patient also had a worsening renal function, developed volume overload, hyperkalemia, acute respiratory failure. Temporary HD catheter was placed in the right femoral artery, dialyzed on 7/8. 7/12.  Volume status much better, temporary dialysis catheter removed. 7/15.  Patient renal function finally getting better, restarted diuretics for congestive heart failure.

## 2021-08-16 DIAGNOSIS — I5033 Acute on chronic diastolic (congestive) heart failure: Secondary | ICD-10-CM | POA: Diagnosis not present

## 2021-08-16 DIAGNOSIS — N179 Acute kidney failure, unspecified: Secondary | ICD-10-CM | POA: Diagnosis not present

## 2021-08-16 DIAGNOSIS — J9601 Acute respiratory failure with hypoxia: Secondary | ICD-10-CM | POA: Diagnosis not present

## 2021-08-16 LAB — CBC
HCT: 37.3 % (ref 36.0–46.0)
Hemoglobin: 11.2 g/dL — ABNORMAL LOW (ref 12.0–15.0)
MCH: 25.2 pg — ABNORMAL LOW (ref 26.0–34.0)
MCHC: 30 g/dL (ref 30.0–36.0)
MCV: 84 fL (ref 80.0–100.0)
Platelets: 438 10*3/uL — ABNORMAL HIGH (ref 150–400)
RBC: 4.44 MIL/uL (ref 3.87–5.11)
RDW: 15.7 % — ABNORMAL HIGH (ref 11.5–15.5)
WBC: 13.6 10*3/uL — ABNORMAL HIGH (ref 4.0–10.5)
nRBC: 0 % (ref 0.0–0.2)

## 2021-08-16 LAB — BASIC METABOLIC PANEL
Anion gap: 7 (ref 5–15)
BUN: 44 mg/dL — ABNORMAL HIGH (ref 8–23)
CO2: 28 mmol/L (ref 22–32)
Calcium: 8.4 mg/dL — ABNORMAL LOW (ref 8.9–10.3)
Chloride: 102 mmol/L (ref 98–111)
Creatinine, Ser: 2.8 mg/dL — ABNORMAL HIGH (ref 0.44–1.00)
GFR, Estimated: 18 mL/min — ABNORMAL LOW (ref >60)
Glucose, Bld: 199 mg/dL — ABNORMAL HIGH (ref 70–99)
Potassium: 4.9 mmol/L (ref 3.5–5.1)
Sodium: 137 mmol/L (ref 135–145)

## 2021-08-16 LAB — HEMOGLOBIN AND HEMATOCRIT, BLOOD
HCT: 37.7 % (ref 36.0–46.0)
Hemoglobin: 11 g/dL — ABNORMAL LOW (ref 12.0–15.0)

## 2021-08-16 LAB — GLUCOSE, CAPILLARY
Glucose-Capillary: 179 mg/dL — ABNORMAL HIGH (ref 70–99)
Glucose-Capillary: 206 mg/dL — ABNORMAL HIGH (ref 70–99)
Glucose-Capillary: 223 mg/dL — ABNORMAL HIGH (ref 70–99)
Glucose-Capillary: 229 mg/dL — ABNORMAL HIGH (ref 70–99)
Glucose-Capillary: 235 mg/dL — ABNORMAL HIGH (ref 70–99)

## 2021-08-16 LAB — MAGNESIUM: Magnesium: 1.9 mg/dL (ref 1.7–2.4)

## 2021-08-16 MED ORDER — PANTOPRAZOLE SODIUM 40 MG PO TBEC
40.0000 mg | DELAYED_RELEASE_TABLET | Freq: Two times a day (BID) | ORAL | Status: DC
Start: 2021-08-16 — End: 2021-08-23
  Administered 2021-08-16 – 2021-08-23 (×14): 40 mg via ORAL
  Filled 2021-08-16 (×14): qty 1

## 2021-08-16 NOTE — Progress Notes (Signed)
[facility]  ROUNDING NOTE   Subjective:   Jocelyn Wells is a 66 year old female with past medical conditions including COPD, depression, diabetes, hypertension, stroke, and chronic kidney disease stage IV.  Patient presents to the hospital for scheduled procedure in vascular lab.  Patient was admitted for Atherosclerosis of native arteries of the extremities with ulceration [facility]) [I70.25]  Patient is known to our practice and is followed by Dr.Korrapati outpatient.  Patient was last seen in office on 07/30/2021.  Patient is seen resting in bed, husband and other family at bedside.  Patient currently on BiPAP.  Chart review reveals patient has shortness of breath overnight that persisted after IV Lasix.  Patient remains on BiPAP awaiting ABG.  According to family, patient has had poor appetite since admission.  Denies NSAID use.  Denies nausea or vomiting.  Denies shortness of breath prior to admission.   Patient family was present in the room today Patient family asked me relevant question about dialysis. I then educated patient and her family about the kidney related issues Patient was last dialyzed yesterday.  I discussed the patient and the family no need for dialysis today   Objective:  Vital signs in last 24 hours:  Temp:  [98 F (36.7 C)-99.1 F (37.3 C)] 98 F (36.7 C) (07/09 0449) Pulse Rate:  [78-93] 82 (07/09 0449) Resp:  [16-35] 18 (07/08 2023) BP: (127-161)/(46-127) 158/77 (07/09 0449) SpO2:  [94 %-96 %] 94 % (07/08 2023) Weight:  [108.5 kg-108.8 kg] 108.5 kg (07/08 1157)  Weight change:  Filed Weights   08/15/21 0937 08/15/21 1157  Weight: 108.8 kg 108.5 kg    Intake/Output: I/O last 3 completed shifts: In: 715.9 [P.O.:600; I.V.:115.9] Out: 401 [Urine:400; Other:1]   Intake/Output this shift:  Total I/O In: -  Out: 900 [Urine:900]  Physical Exam: General: Ill-appearing  Head: Normocephalic, atraumatic.  Dry oral mucosal membranes  Eyes:  Anicteric  Lungs:  Clear to auscultation, BiPAP  Heart: Regular rate and rhythm  Abdomen:  Soft, nontender, obese  Extremities: Trace peripheral edema.  Neurologic: Nonfocal, moving all four extremities  Skin: No lesions  Access: Temporary catheter in situ    Basic Metabolic Panel: Recent Labs  Lab 08/12/21 1059 08/13/21 1715 08/13/21 1715 08/14/21 0552 08/14/21 0818 08/14/21 2325 08/15/21 0412 08/15/21 0732 08/15/21 1243 08/16/21 0432  NA  --  137  --  136  --   --   --   --   --  137  K  --  5.5*   < > 5.8*   < > 5.3* 5.4* 5.3* 4.5 4.9  CL  --  100  --  102  --   --   --   --   --  102  CO2  --  29  --  27  --   --   --   --   --  28  GLUCOSE  --  202*  --  243*  --   --   --   --   --  199*  BUN 53* 57*  --  60*  --   --   --   --   --  44*  CREATININE 3.85* 4.07*  --  4.13*  --   --   --   --   --  2.80*  CALCIUM  --  8.4*  --  8.4*  --   --   --   --   --  8.4*  MG  --   --   --   --   --   --   --   --   --  1.9   < > = values in this interval not displayed.    Liver Function Tests: No results for input(s): "AST", "ALT", "ALKPHOS", "BILITOT", "PROT", "ALBUMIN" in the last 168 hours. No results for input(s): "LIPASE", "AMYLASE" in the last 168 hours. No results for input(s): "AMMONIA" in the last 168 hours.  CBC: Recent Labs  Lab 08/13/21 0525 08/14/21 0552 08/14/21 1819 08/14/21 2325 08/15/21 0732 08/15/21 1530 08/15/21 2246 08/16/21 0432  WBC 14.9* 14.4*  --   --   --   --   --  13.6*  HGB 11.8* 11.1*   < > 10.5* 10.6* 10.8* 10.8* 11.2*  HCT 41.0 38.4   < > 35.3* 35.3* 35.8* 36.3 37.3  MCV 85.8 84.6  --   --   --   --   --  84.0  PLT 477* 449*  --   --   --   --   --  438*   < > = values in this interval not displayed.    Cardiac Enzymes: No results for input(s): "CKTOTAL", "CKMB", "CKMBINDEX", "TROPONINI" in the last 168 hours.  BNP: Invalid input(s): "POCBNP"  CBG: Recent Labs  Lab 08/15/21 0838 08/15/21 1227 08/15/21 1558 08/15/21 2128  08/16/21 0458  GLUCAP 178* 211* 274* 195* 179*    Microbiology: Results for orders placed or performed in visit on 07/29/21  Aerobic culture     Status: None   Collection Time: 07/29/21 10:45 AM  Result Value Ref Range Status   Aerobic Bacterial Culture Final report  Final   Organism ID, Bacteria Routine flora  Final    Comment: Scant growth    Coagulation Studies: Recent Labs    08/14/21 0552  LABPROT 18.3*  INR 1.5*    Urinalysis: No results for input(s): "COLORURINE", "LABSPEC", "PHURINE", "GLUCOSEU", "HGBUR", "BILIRUBINUR", "KETONESUR", "PROTEINUR", "UROBILINOGEN", "NITRITE", "LEUKOCYTESUR" in the last 72 hours.  Invalid input(s): "APPERANCEUR"    Imaging: US RENAL  Result Date: 08/14/2021 CLINICAL DATA:  Acute kidney injury EXAM: RENAL / URINARY TRACT ULTRASOUND COMPLETE COMPARISON:  04/07/2021 ultrasound FINDINGS: Right Kidney: Renal measurements: 11.4 x 5.5 x 4.9 cm = volume: 159 mL. Cortex is echogenic. No hydronephrosis. Subcentimeter 7 mm hypoechoic area at the upper pole. Left Kidney: Renal measurements: 11.3 x 5.5 x 5.3 cm = volume: 171 mL. Cortex is echogenic. No mass or hydronephrosis Bladder: Appears normal for degree of bladder distention. Other: None. IMPRESSION: 1. Slightly echogenic cortex consistent with medical renal disease. No hydronephrosis 2. Subcentimeter hypoechoic focus at the upper pole right kidney, probably a tiny cyst but small size limits further assessment. Suggest six-month ultrasound follow-up Electronically Signed   By: Donavan Foil M.D.   On: 08/14/2021 17:52   PERIPHERAL VASCULAR CATHETERIZATION  Result Date: 08/14/2021 See surgical note for result.    Medications:    sodium chloride     sodium chloride      ceFAZolin (ANCEF) IV      atorvastatin  40 mg Oral Daily   Chlorhexidine Gluconate Cloth  6 each Topical Q0600   clopidogrel  75 mg Oral Daily   insulin aspart  0-9 Units Subcutaneous TID WC   insulin detemir  10 Units  Subcutaneous Daily   mouth rinse  15 mL Mouth Rinse 4 times per day   pantoprazole (PROTONIX) IV  40 mg Intravenous Q12H   PARoxetine  40 mg Oral Daily   sodium chloride flush  3 mL Intravenous Q12H   sodium chloride, acetaminophen, albuterol, diphenhydrAMINE, famotidine, HYDROmorphone (DILAUDID)  injection, metoCLOPramide (REGLAN) injection, midazolam, morphine injection, ondansetron (ZOFRAN) IV, ondansetron (ZOFRAN) IV, mouth rinse, oxyCODONE, sodium chloride flush  Assessment/ Plan:  Ms. JAHNIAH PALLAS is a 66 y.o.  female with past medical conditions including COPD, depression, diabetes, hypertension, stroke, and chronic kidney disease stage IV.  Patient presents to the hospital for scheduled procedure in vascular lab.  Patient was admitted for Atherosclerosis of native arteries of the extremities with ulceration (HCC) [I70.25]   Acute Kidney Injury on chronic kidney disease stage IV with baseline creatinine 2.5 and GFR of 20 on 07/30/21.  Acute kidney injury secondary to multiple factors including IV contrast and cardiorenal Chronic kidney disease is secondary to diabetes and hypertension Losartan currently held along with diuretics.  IV contrast given during angiogram.  Renal ultrasound ordered.  Patient has multiple factors at play resulting in current kidney failure.  Patient has been given multiple doses of IV furosemide without consistent relief of symptoms.  An IV furosemide drip could be effective however will worsen current kidney failure.  At this time, we feel initiating renal replacement therapy would offer the most benefit.  Discussed this with patient and family, and patient is agreeable.  We will consult vascular for placement of PermCath and initiate treatment tomorrow.  Discussed with family that the need for ongoing dialysis is unknown at this time.  Patient AKI was worsening secondary to multiple etiologies Patient did receive IV contrast/patient did receive IV  diuretics Patient does have a baseline CKD stage IV Patient remained on BiPAP Patient was having moderate uremic symptoms of decreased appetite. Patient creatinine had increased to 4.13/GFR was at 11 mL/min. Patient then had temporary catheter placed yesterday Patient was initiated on renal replacement therapy On August 15, 2021 We did discuss with the patient's family about not sure  whether this is AKI/or now ESRD because patient's low kidney resereve to begin with  Patient was hyperkalemic We dialyzed patient  yesterday   Patient creatinine is better. Patient potassium is better Patient urine output is adequate No need for renal placement therapy today   We will check patient Chem-7 tomorrow and then decide if patient needs dialysis or we can remove the temporary catheter   Lab Results  Component Value Date   CREATININE 2.80 (H) 08/16/2021   CREATININE 4.13 (H) 08/14/2021   CREATININE 4.07 (H) 08/13/2021    Intake/Output Summary (Last 24 hours) at 08/16/2021 0625 Last data filed at 08/16/2021 0452 Gross per 24 hour  Intake 120 ml  Output 1301 ml  Net -1181 ml   2. Anemia of chronic kidney disease Lab Results  Component Value Date   HGB 11.2 (L) 08/16/2021    Hemoglobin remains acceptable.  We will continue to monitor  3. Secondary Hyperparathyroidism: with outpatient labs: PTH 187 on 07/30/21.   Lab Results  Component Value Date   CALCIUM 8.4 (L) 08/16/2021   CAION 1.08 (L) 01/29/2015    Calcium remains within acceptable range.  We will continue to monitor  4.  Hypertension with chronic kidney disease.  Blood pressure stable   5. Diabetes mellitus type II with chronic kidney disease: insulin dependent. Home regimen includes NovoLog with an insulin pump. Most recent hemoglobin A1c is 7.9 on 07/14/21.    6. Hyperkalemia We was dialyzed patient yesterday with low K bath  Now better       LOS: 4 Varsha Knock s [facility] 7/9/20236:25 AM

## 2021-08-16 NOTE — Progress Notes (Addendum)
  Progress Note   Patient: Jocelyn Wells KNL:[id number] DOB: 1955/09/02 DOA: 08/12/2021     4 DOS: the patient was seen and examined on 08/16/2021   Brief hospital course: SOPHIAMARIE NEASE is a 66 y.o. 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 Patient is status post peripheral angioplasty with successful recannulization of the left lower extremity for limb salvage.  Patient also had a worsening renal function, developed volume overload, hyperkalemia, acute respiratory failure. Temporary HD catheter was placed in the right femoral artery, dialyzed on 7/8.  Assessment and Plan: Acute respiratory failure with hypoxemia secondary to volume overload. End-stage renal disease with volume overload. Hyperkalemia Acute on chronic diastolic congestive heart failure. Patient doing better after starting dialysis.  But still has short of breath with exertion. Continue wean oxygen.   Peripheral arterial disease status post intervention. Continue atorvastatin.   Coffee-ground emesis. No recurrence, continue PPI.  Hemoglobin stable.   Type 2 diabetes. Continue Levemir and sliding scale insulin        Subjective:  Patient doing well, no nausea vomiting.  No black stool. Still short of breath with exertion, no cough.  Physical Exam: Vitals:   08/15/21 2023 08/16/21 0449 08/16/21 0848 08/16/21 1142  BP: (!) 159/70 (!) 158/77 (!) 151/75 140/67  Pulse: 83 82 81 80  Resp: 18  (!) 24 20  Temp: 98.2 F  (36.8 C) 98 F (36.7 C) 97.8 F (36.6 C) 98.1 F (36.7 C)  TempSrc:  Oral Oral   SpO2: 94%  95% 95%  Weight:       General exam: Appears calm and comfortable  Respiratory system: A few crackles in left base. Respiratory effort normal. Cardiovascular system: S1 & S2 heard, RRR. No JVD, murmurs, rubs, gallops or clicks. No pedal edema. Gastrointestinal system: Abdomen is nondistended, soft and nontender. No organomegaly or masses felt. Normal bowel sounds heard. Central nervous system: Alert and oriented x3. No focal neurological deficits. Extremities: Symmetric 5 x 5 power. Skin: No rashes, lesions or ulcers Psychiatry: Judgement and insight appear normal. Mood & affect appropriate.   Data Reviewed:  Lab results.  Family Communication: Son updated at bedside at 49  Disposition: Status is: Inpatient Remains inpatient appropriate because: Severity of disease, inpatient dialysis.  Planned Discharge Destination: Home with Home Health    Time spent: 35 minutes  Author: Sharen Hones, MD 08/16/2021 1:10 PM  For on call review [email or web address].

## 2021-08-16 NOTE — Evaluation (Signed)
Occupational Therapy Evaluation Patient Details Name: Jocelyn Wells MRN: [id number] DOB: [birth year] Today's Date: 08/16/2021   History of Present Illness Jocelyn Wells is a 66 y.o. 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  Patient is status post peripheral angioplasty with successful recannulization of the left lower extremity for limb salvage. Patient also had a worsening renal function, developed volume overload, hyperkalemia, acute respiratory failure.  Temporary HD catheter was placed in the right femoral artery, dialyzed on 7/8.   Clinical Impression   Jocelyn Wells presents with generalized weakness, limited endurance, dyspnea, and fatigue. Prior to admission, she lives with her husband, uses a [facility] or rollator for ambulation, reports she is able to perform ADLs INDly. During today's evaluation, pt denies pain, is A&O x 4 but with flat affect and delayed speech. She is able to perform bed mobility, grooming, LB dressing with CGA/SUPV for safety and with greatly increased time. She becomes increasingly SOB with activity and requires rest breaks, although O2 sats remain at 93% or higher, on 4 L O2 (Pt does not use O2 at baseline). Pt reports having at least 4 falls in previous 6 months; she believes all falls occurred when she was using cane as opposed to rollator. Provided educ re: falls prevention, possibility of moving to using 2-handed UE support for all ambulation. As pt's ability to engage in [place]  mobility increases, will want to trial use of RW, gauging if this provides greater stability than SPC (which is available in room). Recommend HHOT after DC, to assist pt in regaining strength, endurance, and improved balance.     Recommendations for follow up therapy are one component of a multi-disciplinary discharge planning process, led by the attending physician.  Recommendations may be updated based on patient status, additional functional criteria and insurance authorization.   Follow Up Recommendations  Home health OT    Assistance Recommended at Discharge Intermittent Supervision/Assistance  Patient can return home with the following A little help with walking and/or transfers;A little help with bathing/dressing/bathroom;Assistance with cooking/housework;Help with stairs or ramp for entrance    Functional Status Assessment  Patient has had a recent decline in their functional status and demonstrates the ability to make significant improvements in function in a reasonable and predictable amount of time.  Equipment Recommendations  Other (comment) (RW)    Recommendations for Other Services       Precautions / Restrictions Precautions Precautions: Fall Precaution Comments: temporary R femoral catheter Restrictions Weight Bearing Restrictions: No      Mobility Bed Mobility Overal bed mobility: Needs Assistance Bed Mobility: Supine to Sit, Sit to Supine     Supine to sit: Min guard Sit to supine: Min guard   General bed mobility comments: requires greatly increased time + rest breaks. Performs sit<supine more easily than supine<sit    Transfers                   General transfer comment: not attempted, per RN request      Balance Overall balance assessment: Needs assistance Sitting-balance support: Bilateral upper extremity supported  Sitting balance-Leahy Scale: Fair         Standing balance comment: unable to observe                            ADL either performed or assessed with clinical judgement   ADL Overall ADL's : Needs assistance/impaired     Grooming: Wash/dry hands;Wash/dry face;Sitting;Set up;Supervision/safety Grooming Details (indicate cue type and reason): increased time             Lower Body Dressing: Min guard Lower Body Dressing Details (indicate cue type and reason): Requires greatly increased time for donning socks but is eventually able to complete                     Vision         Perception     Praxis      Pertinent Vitals/Pain Pain Assessment Pain Assessment: No/denies pain     Hand Dominance Right   Extremity/Trunk Assessment Upper Extremity Assessment Upper Extremity Assessment: Generalized weakness   Lower Extremity Assessment Lower Extremity Assessment: Generalized weakness       Communication Communication Communication: Expressive difficulties   Cognition Arousal/Alertness: Awake/alert Behavior During Therapy: WFL for tasks assessed/performed, Flat affect Overall Cognitive Status: Within Functional Limits for tasks assessed                                 General Comments: son is in room and answers most questions directed to his mother, making it difficult to get an accurate assessment of her cognition     General Comments  large wound on L LE from fall > 1 month ago. Pt and son reports is has been healing well since angioplasty procedure    Exercises Other Exercises Other Exercises: Educ re: falls prevention, home safety, PoC, DC recs   Shoulder Instructions      Home Living Family/patient expects to be discharged to:: Private residence Living Arrangements: Spouse/significant other Available Help at Discharge: Family;Available 24 hours/day Type of Home: House Home Access: Stairs to enter [employer] of Steps: 4 Entrance Stairs-Rails: Can reach both;Right;Left Home Layout: One level     Bathroom Shower/Tub: Emergency planning/management officer: Handicapped height     Home Equipment: Rollator (4 wheels);Cane - single point;Shower seat;Electric scooter          Prior Functioning/Environment Prior Level of Function : Needs assist             Mobility Comments: Uses SPC or rollator, scooter for community distances. Reports at least 4 falls in previous 6 months. ADLs Comments: Reports Mod I in ADLs        OT Problem List: Decreased strength;Decreased activity tolerance;Impaired balance (sitting and/or standing);Decreased knowledge of use of DME or AE      OT Treatment/Interventions: Self-care/ADL training;Therapeutic exercise;Patient/family education;Balance training;Energy conservation;Therapeutic activities;DME and/or AE instruction    OT Goals(Current goals can be found in the care plan section) Acute Rehab OT Goals Patient Stated Goal: to get home OT Goal Formulation: With patient Time For Goal Achievement: 08/30/21 Potential to Achieve Goals: Good ADL Goals Pt Will Perform Lower Body Dressing: with modified independence;sitting/lateral leans Pt Will Transfer to Toilet: with modified independence;regular height toilet;stand pivot transfer (using LRAD) Pt/caregiver will Perform Home Exercise Program: Increased strength;Increased ROM;Independently  OT Frequency: Min 2X/week    Co-evaluation  AM-PAC OT "6 Clicks" Daily Activity     Outcome Measure Help from another person eating meals?: None Help from another person taking care of personal grooming?: A Little Help from another person toileting, which includes using toliet, bedpan, or urinal?: A Lot Help from another person bathing (including washing, rinsing, drying)?: A Little Help from another person to put on and taking off regular upper body clothing?: A Little Help from another person to put on and taking off regular lower body clothing?: A Little 6 Click Score: 18   End of Session Nurse Communication: Mobility  status  Activity Tolerance: Patient tolerated treatment well Patient left: in bed;with call bell/phone within reach;with bed alarm set;with family/visitor present  OT Visit Diagnosis: Unsteadiness on feet (R26.81);Muscle weakness (generalized) (M62.81);Repeated falls (R29.6)                Time: 5366-4403 OT Time Calculation (min): 20 min Charges:  OT General Charges $OT Visit: 1 Visit OT Evaluation $OT Eval Low Complexity: 1 Low OT Treatments $Self Care/Home Management : 8-22 mins Josiah Lobo, PhD, MS, OTR/L 08/16/21, 4:57 PM

## 2021-08-17 ENCOUNTER — Encounter: Payer: Self-pay | Admitting: Vascular Surgery

## 2021-08-17 DIAGNOSIS — Z992 Dependence on renal dialysis: Secondary | ICD-10-CM | POA: Diagnosis not present

## 2021-08-17 DIAGNOSIS — I5033 Acute on chronic diastolic (congestive) heart failure: Secondary | ICD-10-CM | POA: Diagnosis not present

## 2021-08-17 DIAGNOSIS — J9601 Acute respiratory failure with hypoxia: Secondary | ICD-10-CM | POA: Diagnosis not present

## 2021-08-17 DIAGNOSIS — N186 End stage renal disease: Secondary | ICD-10-CM | POA: Diagnosis not present

## 2021-08-17 LAB — RENAL FUNCTION PANEL
Albumin: 3 g/dL — ABNORMAL LOW (ref 3.5–5.0)
Anion gap: 6 (ref 5–15)
BUN: 48 mg/dL — ABNORMAL HIGH (ref 8–23)
CO2: 29 mmol/L (ref 22–32)
Calcium: 8.7 mg/dL — ABNORMAL LOW (ref 8.9–10.3)
Chloride: 103 mmol/L (ref 98–111)
Creatinine, Ser: 2.74 mg/dL — ABNORMAL HIGH (ref 0.44–1.00)
GFR, Estimated: 18 mL/min — ABNORMAL LOW (ref >60)
Glucose, Bld: 213 mg/dL — ABNORMAL HIGH (ref 70–99)
Phosphorus: 3.8 mg/dL (ref 2.5–4.6)
Potassium: 5.1 mmol/L (ref 3.5–5.1)
Sodium: 138 mmol/L (ref 135–145)

## 2021-08-17 LAB — CBC
HCT: 38.8 % (ref 36.0–46.0)
Hemoglobin: 11.4 g/dL — ABNORMAL LOW (ref 12.0–15.0)
MCH: 24.7 pg — ABNORMAL LOW (ref 26.0–34.0)
MCHC: 29.4 g/dL — ABNORMAL LOW (ref 30.0–36.0)
MCV: 84 fL (ref 80.0–100.0)
Platelets: 444 10*3/uL — ABNORMAL HIGH (ref 150–400)
RBC: 4.62 MIL/uL (ref 3.87–5.11)
RDW: 15.8 % — ABNORMAL HIGH (ref 11.5–15.5)
WBC: 13.3 10*3/uL — ABNORMAL HIGH (ref 4.0–10.5)
nRBC: 0 % (ref 0.0–0.2)

## 2021-08-17 LAB — GLUCOSE, CAPILLARY
Glucose-Capillary: 205 mg/dL — ABNORMAL HIGH (ref 70–99)
Glucose-Capillary: 207 mg/dL — ABNORMAL HIGH (ref 70–99)
Glucose-Capillary: 215 mg/dL — ABNORMAL HIGH (ref 70–99)
Glucose-Capillary: 223 mg/dL — ABNORMAL HIGH (ref 70–99)
Glucose-Capillary: 232 mg/dL — ABNORMAL HIGH (ref 70–99)
Glucose-Capillary: 238 mg/dL — ABNORMAL HIGH (ref 70–99)

## 2021-08-17 MED ORDER — INSULIN DETEMIR 100 UNIT/ML ~~LOC~~ SOLN
20.0000 [IU] | Freq: Every day | SUBCUTANEOUS | Status: DC
Start: 2021-08-18 — End: 2021-08-21
  Administered 2021-08-18 – 2021-08-21 (×4): 20 [IU] via SUBCUTANEOUS
  Filled 2021-08-17 (×5): qty 0.2

## 2021-08-17 MED ORDER — INSULIN DETEMIR 100 UNIT/ML ~~LOC~~ SOLN
10.0000 [IU] | Freq: Once | SUBCUTANEOUS | Status: DC
  Filled 2021-08-17: qty 0.1

## 2021-08-17 MED ORDER — HEPARIN SODIUM (PORCINE) 1000 UNIT/ML IJ SOLN
INTRAMUSCULAR | Status: AC
  Administered 2021-08-17: 3600 [IU]
  Filled 2021-08-17: qty 10

## 2021-08-17 MED ORDER — APIXABAN 5 MG PO TABS
5.0000 mg | ORAL_TABLET | Freq: Two times a day (BID) | ORAL | Status: DC
  Administered 2021-08-17 – 2021-08-20 (×6): 5 mg via ORAL
  Filled 2021-08-17 (×6): qty 1

## 2021-08-17 NOTE — Progress Notes (Signed)
  Progress Note   Patient: Jocelyn Wells HQP:[id number] DOB: 04-03-1955 DOA: 08/12/2021     5 DOS: the patient was seen and examined on 08/17/2021   Brief hospital course: KALEISHA BHARGAVA is a 66 y.o. 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 Patient is status post peripheral angioplasty with successful recannulization of the left lower extremity for limb salvage.  Patient also had a worsening renal function, developed volume overload, hyperkalemia, acute respiratory failure. Temporary HD catheter was placed in the right femoral artery, dialyzed on 7/8.  Assessment and Plan: Acute respiratory failure with hypoxemia secondary to volume overload. End-stage renal disease with volume overload. Hyperkalemia Acute on chronic diastolic congestive heart failure. Patient still appears to have volume overload, discussed with nephrology, patient will be dialyzed again today.   Peripheral arterial disease status post intervention. Continue atorvastatin.   Coffee-ground emesis. Condition stable, no additional nausea vomiting   Type 2 diabetes. Continue Levemir and sliding scale insulin        Subjective:  Patient still has short of breath with exertion.  Physical Exam: Vitals:   08/16/21 2356 08/17/21 0504 08/17/21 0828 08/17/21 1229  BP: (!) 165/72 (!) 168/92 (!) 159/79 (!) 161/74  Pulse: 80 86 79 78  Resp: 20  20 (!) 22  Temp: 98 F (36.7 C) 98.1 F (36.7 C) 97.8 F (36.6 C) 97.9 F  (36.6 C)  TempSrc: Oral  Oral   SpO2: 97% 95% 94% 96%  Weight:       General exam: Appears calm and comfortable  Respiratory system: Crackles in the bases.Marland Kitchen Respiratory effort normal. Cardiovascular system: S1 & S2 heard, RRR. No JVD, murmurs, rubs, gallops or clicks. No pedal edema. Gastrointestinal system: Abdomen is nondistended, soft and nontender. No organomegaly or masses felt. Normal bowel sounds heard. Central nervous system: Alert and oriented. No focal neurological deficits. Extremities: Symmetric 5 x 5 power. Skin: No rashes, lesions or ulcers Psychiatry: Judgement and insight appear normal. Mood & affect appropriate. ' Data Reviewed:  Lab results reviewed  Family Communication: Son updated  Disposition: Status is: Inpatient Remains inpatient appropriate because: Severity of disease, inpatient dialysis  Planned Discharge Destination: Home with Home Health    Time spent: 35 minutes  Author: Sharen Hones, MD 08/17/2021 12:36 PM  For on call review [email or web address].

## 2021-08-17 NOTE — Care Management Important Message (Signed)
Important Message  Patient Details  Name: Jocelyn Wells MRN: [id number] Date of Birth: [birth year]   Medicare Important Message Given:  Yes     Dannette Barbara 08/17/2021, 11:56 AM

## 2021-08-17 NOTE — Inpatient Diabetes Management (Signed)
Inpatient Diabetes Program Recommendations  AACE/ADA: New Consensus Statement on Inpatient Glycemic Control   Target Ranges:  Prepandial:   less than 140 mg/dL      Peak postprandial:   less than 180 mg/dL (1-2 hours)      Critically ill patients:  140 - 180 mg/dL    Latest Reference Range & Units 08/16/21 08:19 08/16/21 11:45 08/16/21 16:42 08/16/21 21:05 08/17/21 05:01 08/17/21 08:31  Glucose-Capillary 70 - 99 mg/dL 206 (H) 223 (H) 229 (H) 235 (H) 205 (H) 223 (H)   Review of Glycemic Control  Diabetes history: DM2 Outpatient Diabetes medications: Omnipod insulin pump with Novolog, FreeStyle Libre CGM, Trulicity 3 mg Qweek Current orders for Inpatient glycemic control: Levemir 10 units daily, Novolog 0-9 units TID with meals   Inpatient Diabetes Program Recommendations:     Insulin: Please consider increasing Levemir to 20 units daily.   Thanks, Barnie Alderman, RN, MSN, [place] Diabetes Coordinator Inpatient Diabetes Program [phone] (Team Pager from 8am to [place])

## 2021-08-17 NOTE — Progress Notes (Signed)
[facility]  ROUNDING NOTE   Subjective:   Jocelyn Wells is a 65 year old female with past medical conditions including COPD, depression, diabetes, hypertension, stroke, and chronic kidney disease stage IV.  Patient presents to the hospital for scheduled procedure in vascular lab.  Patient was admitted for Atherosclerosis of native arteries of the extremities with ulceration [facility]) [I70.25]  Patient is known to our practice and is followed by Dr.Korrapati outpatient.  Patient was last seen in office on 07/30/2021.   Patient seen sitting up in bed, alert and oriented Family at bedside, husband and son. Patient remains on nasal cannula, 2 L appetite remains poor, denies nausea and vomiting   Objective:  Vital signs in last 24 hours:  Temp:  [97.8 F (36.6 C)-98.1 F (36.7 C)] 97.9 F (36.6 C) (07/10 1300) Pulse Rate:  [73-86] 83 (07/10 1330) Resp:  [18-27] 26 (07/10 1330) BP: (147-169)/(68-92) 166/82 (07/10 1330) SpO2:  [94 %-100 %] 100 % (07/10 1330) Weight:  [103.4 kg] 103.4 kg (07/10 1300)  Weight change:  Filed Weights   08/15/21 0937 08/15/21 1157 08/17/21 1300  Weight: 108.8 kg 108.5 kg 103.4 kg    Intake/Output: I/O last 3 completed shifts: In: -  Out: 1700 [Urine:1700]   Intake/Output this shift:  Total I/O In: 240 [P.O.:240] Out: -   Physical Exam: General: NAD  Head: Normocephalic, atraumatic.  Dry oral mucosal membranes  Eyes: Anicteric  Lungs:  Basilar crackles, [place] O2  Heart: Regular rate and rhythm  Abdomen:  Soft, nontender, obese  Extremities: No peripheral edema.  Neurologic: Nonfocal, moving all four extremities  Skin: No lesions  Access: Temporary catheter in situ    Basic Metabolic Panel: Recent Labs  Lab 08/12/21 1059 08/13/21 1715 08/13/21 1715 08/14/21 0552 08/14/21 0818 08/15/21 0412 08/15/21 0732 08/15/21 1243 08/16/21 0432 08/17/21 0217  NA  --  137  --  136  --   --   --   --  137 138  K  --  5.5*   < > 5.8*   <  > 5.4* 5.3* 4.5 4.9 5.1  CL  --  100  --  102  --   --   --   --  102 103  CO2  --  29  --  27  --   --   --   --  28 29  GLUCOSE  --  202*  --  243*  --   --   --   --  199* 213*  BUN 53* 57*  --  60*  --   --   --   --  44* 48*  CREATININE 3.85* 4.07*  --  4.13*  --   --   --   --  2.80* 2.74*  CALCIUM  --  8.4*   < > 8.4*  --   --   --   --  8.4* 8.7*  MG  --   --   --   --   --   --   --   --  1.9  --   PHOS  --   --   --   --   --   --   --   --   --  3.8   < > = values in this interval not displayed.     Liver Function Tests: Recent Labs  Lab 08/17/21 0217  ALBUMIN 3.0*   No results for input(s): "LIPASE", "AMYLASE" in the last 168  hours. No results for input(s): "AMMONIA" in the last 168 hours.  CBC: Recent Labs  Lab 08/13/21 0525 08/14/21 0552 08/14/21 1819 08/15/21 1530 08/15/21 2246 08/16/21 0432 08/16/21 0647 08/17/21 0217  WBC 14.9* 14.4*  --   --   --  13.6*  --  13.3*  HGB 11.8* 11.1*   < > 10.8* 10.8* 11.2* 11.0* 11.4*  HCT 41.0 38.4   < > 35.8* 36.3 37.3 37.7 38.8  MCV 85.8 84.6  --   --   --  84.0  --  84.0  PLT 477* 449*  --   --   --  438*  --  444*   < > = values in this interval not displayed.     Cardiac Enzymes: No results for input(s): "CKTOTAL", "CKMB", "CKMBINDEX", "TROPONINI" in the last 168 hours.  BNP: Invalid input(s): "POCBNP"  CBG: Recent Labs  Lab 08/16/21 1642 08/16/21 2105 08/17/21 0501 08/17/21 0831 08/17/21 1232  GLUCAP 229* 235* 205* 223* 238*     Microbiology: Results for orders placed or performed in visit on 07/29/21  Aerobic culture     Status: None   Collection Time: 07/29/21 10:45 AM  Result Value Ref Range Status   Aerobic Bacterial Culture Final report  Final   Organism ID, Bacteria Routine flora  Final    Comment: Scant growth    Coagulation Studies: No results for input(s): "LABPROT", "INR" in the last 72 hours.   Urinalysis: No results for input(s): "COLORURINE", "LABSPEC", "PHURINE",  "GLUCOSEU", "HGBUR", "BILIRUBINUR", "KETONESUR", "PROTEINUR", "UROBILINOGEN", "NITRITE", "LEUKOCYTESUR" in the last 72 hours.  Invalid input(s): "APPERANCEUR"    Imaging: No results found.   Medications:    sodium chloride     sodium chloride      ceFAZolin (ANCEF) IV      atorvastatin  40 mg Oral Daily   Chlorhexidine Gluconate Cloth  6 each Topical Q0600   clopidogrel  75 mg Oral Daily   insulin aspart  0-9 Units Subcutaneous TID WC   insulin detemir  10 Units Subcutaneous Daily   mouth rinse  15 mL Mouth Rinse 4 times per day   pantoprazole  40 mg Oral BID   PARoxetine  40 mg Oral Daily   sodium chloride flush  3 mL Intravenous Q12H   sodium chloride, acetaminophen, albuterol, diphenhydrAMINE, famotidine, HYDROmorphone (DILAUDID) injection, metoCLOPramide (REGLAN) injection, midazolam, ondansetron (ZOFRAN) IV, ondansetron (ZOFRAN) IV, mouth rinse, oxyCODONE, sodium chloride flush  Assessment/ Plan:  [name] is a 66 y.o.  female with past medical conditions including COPD, depression, diabetes, hypertension, stroke, and chronic kidney disease stage IV.  Patient presents to the hospital for scheduled procedure in vascular lab.  Patient was admitted for Atherosclerosis of native arteries of the extremities with ulceration (HCC) [I70.25]   Acute Kidney Injury on chronic kidney disease stage IV with baseline creatinine 2.5 and GFR of 20 on 07/30/21.  Acute kidney injury secondary to multiple factors including IV contrast and cardiorenal Chronic kidney disease is secondary to diabetes and hypertension Losartan currently held along with diuretics.  IV contrast given during angiogram.  Renal ultrasound ordered.  Patient has multiple factors at play resulting in current kidney failure.  Patient has been given multiple doses of IV furosemide without consistent relief of symptoms.  An IV furosemide drip could be effective however will worsen current kidney failure.   Patient  received initial dialysis treatment on Saturday, tolerated well.  It was originally believed patient may not require treatment today  however respirations still appear increased and patient appears uncomfortable.  We will provide dialysis treatment today with UF goal 1 to 1.5 L to improve respiratory status.  We will determine need for dialysis tomorrow.  Lab Results  Component Value Date   CREATININE 2.74 (H) 08/17/2021   CREATININE 2.80 (H) 08/16/2021   CREATININE 4.13 (H) 08/14/2021    Intake/Output Summary (Last 24 hours) at 08/17/2021 1343 Last data filed at 08/17/2021 1027 Gross per 24 hour  Intake 240 ml  Output 800 ml  Net -560 ml    2. Anemia of chronic kidney disease Lab Results  Component Value Date   HGB 11.4 (L) 08/17/2021    Hemoglobin at goal  3. Secondary Hyperparathyroidism: with outpatient labs: PTH 187 on 07/30/21.   Lab Results  Component Value Date   CALCIUM 8.7 (L) 08/17/2021   CAION 1.08 (L) 01/29/2015   PHOS 3.8 08/17/2021    Calcium remains within acceptable range.  We will continue to monitor  4.  Hypertension with chronic kidney disease.  Blood pressure stable, 166/82   5. Diabetes mellitus type II with chronic kidney disease: insulin dependent. Home regimen includes NovoLog with an insulin pump. Most recent hemoglobin A1c is 7.9 on 07/14/21.    6. Hyperkalemia Potassium corrected with dialysis    LOS: 5   7/10/20231:43 PM

## 2021-08-17 NOTE — Progress Notes (Signed)
PT Cancellation Note  Patient Details Name: SHARETA FISHBAUGH MRN: [id number] DOB: [birth year]   Cancelled Treatment:    Reason Eval/Treat Not Completed: Other (comment) PT orders received, chart reviewed. Held PT evaluation this AM until MD could f/u on mobility restrictions as pt with R temporary femoral cath. F/u after lunch & pt now noted to be at dialysis. Will f/u as able.  Lavone Nian, PT, DPT 08/17/21, 1:11 PM   Waunita Schooner 08/17/2021, 1:11 PM

## 2021-08-17 NOTE — Progress Notes (Signed)
Spoke to the patient and her husband She lives at home with her husband She uses a rollator and a cane at home and stated she does not want or need additional DME unless she needs Oxygen and then they will accept that [facility] will continue to monitor for Oxygen neds She would like to have [facility] for [facility] Her husband provides transportation No additional needs at this time

## 2021-08-17 NOTE — Plan of Care (Signed)
  Problem: Education: Goal: Ability to describe self-care measures that may prevent or decrease complications (Diabetes Survival Skills Education) will improve Outcome: Progressing Goal: Individualized Educational Video(s) Outcome: Progressing   Problem: Coping: Goal: Ability to adjust to condition or change in health will improve Outcome: Progressing   

## 2021-08-17 NOTE — Progress Notes (Signed)
Hemodialysis Post Treatment Note:  Tx date:08/17/2021 Tx time: 1 hour and 55 minutes  Access: right femoral catheter UF Removed: 599 ml  Note:  13:09 HD started.   15:12  HD treatment terminated due to high  venous pressure and clot on venous chamber. Blood unable to return due to constant high venous pressure. NP Shantelle aware. About 200 ml of blood loss. Patient is asymptomatic. Post HD BP  168/69

## 2021-08-18 DIAGNOSIS — J9601 Acute respiratory failure with hypoxia: Secondary | ICD-10-CM | POA: Diagnosis not present

## 2021-08-18 DIAGNOSIS — I5033 Acute on chronic diastolic (congestive) heart failure: Secondary | ICD-10-CM | POA: Diagnosis not present

## 2021-08-18 DIAGNOSIS — Z992 Dependence on renal dialysis: Secondary | ICD-10-CM | POA: Diagnosis not present

## 2021-08-18 DIAGNOSIS — N186 End stage renal disease: Secondary | ICD-10-CM | POA: Diagnosis not present

## 2021-08-18 LAB — BASIC METABOLIC PANEL
Anion gap: 8 (ref 5–15)
BUN: 39 mg/dL — ABNORMAL HIGH (ref 8–23)
CO2: 27 mmol/L (ref 22–32)
Calcium: 8.6 mg/dL — ABNORMAL LOW (ref 8.9–10.3)
Chloride: 102 mmol/L (ref 98–111)
Creatinine, Ser: 2.2 mg/dL — ABNORMAL HIGH (ref 0.44–1.00)
GFR, Estimated: 24 mL/min — ABNORMAL LOW (ref >60)
Glucose, Bld: 224 mg/dL — ABNORMAL HIGH (ref 70–99)
Potassium: 4.7 mmol/L (ref 3.5–5.1)
Sodium: 137 mmol/L (ref 135–145)

## 2021-08-18 LAB — CBC
HCT: 37.9 % (ref 36.0–46.0)
Hemoglobin: 11.4 g/dL — ABNORMAL LOW (ref 12.0–15.0)
MCH: 25.2 pg — ABNORMAL LOW (ref 26.0–34.0)
MCHC: 30.1 g/dL (ref 30.0–36.0)
MCV: 83.7 fL (ref 80.0–100.0)
Platelets: 516 10*3/uL — ABNORMAL HIGH (ref 150–400)
RBC: 4.53 MIL/uL (ref 3.87–5.11)
RDW: 15.8 % — ABNORMAL HIGH (ref 11.5–15.5)
WBC: 13.8 10*3/uL — ABNORMAL HIGH (ref 4.0–10.5)
nRBC: 0 % (ref 0.0–0.2)

## 2021-08-18 LAB — GLUCOSE, CAPILLARY
Glucose-Capillary: 196 mg/dL — ABNORMAL HIGH (ref 70–99)
Glucose-Capillary: 216 mg/dL — ABNORMAL HIGH (ref 70–99)
Glucose-Capillary: 217 mg/dL — ABNORMAL HIGH (ref 70–99)
Glucose-Capillary: 229 mg/dL — ABNORMAL HIGH (ref 70–99)
Glucose-Capillary: 248 mg/dL — ABNORMAL HIGH (ref 70–99)
Glucose-Capillary: 288 mg/dL — ABNORMAL HIGH (ref 70–99)

## 2021-08-18 MED ORDER — LIDOCAINE HCL (PF) 1 % IJ SOLN: 5.0000 mL | INTRAMUSCULAR | Status: DC | PRN

## 2021-08-18 MED ORDER — HEPARIN SODIUM (PORCINE) 1000 UNIT/ML DIALYSIS
1000.0000 [IU] | INTRAMUSCULAR | Status: DC | PRN
  Administered 2021-08-18: 3600 [IU] via INTRAVENOUS_CENTRAL

## 2021-08-18 MED ORDER — LIDOCAINE-PRILOCAINE 2.5-2.5 % EX CREA: 1.0000 | TOPICAL_CREAM | CUTANEOUS | Status: DC | PRN

## 2021-08-18 MED ORDER — HEPARIN SODIUM (PORCINE) 1000 UNIT/ML IJ SOLN
INTRAMUSCULAR | Status: AC
  Administered 2021-08-18: 2000 [IU] via INTRAVENOUS_CENTRAL
  Filled 2021-08-18: qty 10

## 2021-08-18 MED ORDER — ALTEPLASE 2 MG IJ SOLR: 2.0000 mg | Freq: Once | INTRAMUSCULAR | Status: DC | PRN

## 2021-08-18 MED ORDER — PENTAFLUOROPROP-TETRAFLUOROETH EX AERO: 1.0000 | INHALATION_SPRAY | CUTANEOUS | Status: DC | PRN

## 2021-08-18 MED ORDER — SENNOSIDES-DOCUSATE SODIUM 8.6-50 MG PO TABS
2.0000 | ORAL_TABLET | Freq: Two times a day (BID) | ORAL | Status: DC
  Administered 2021-08-18 – 2021-08-20 (×5): 2 via ORAL
  Filled 2021-08-18 (×5): qty 2

## 2021-08-18 NOTE — Plan of Care (Signed)
  Problem: Skin Integrity: Goal: Risk for impaired skin integrity will decrease Outcome: Progressing   Problem: Clinical Measurements: Goal: Ability to maintain clinical measurements within normal limits will improve Outcome: Progressing Goal: Will remain free from infection Outcome: Progressing Goal: Diagnostic test results will improve Outcome: Progressing Goal: Respiratory complications will improve Outcome: Progressing Goal: Cardiovascular complication will be avoided Outcome: Progressing   Problem: Activity: Goal: Risk for activity intolerance will decrease Outcome: Progressing

## 2021-08-18 NOTE — Progress Notes (Signed)
OT Cancellation Note  Patient Details Name: NADIAH CORBIT MRN: [id number] DOB: 1955/10/17   Cancelled Treatment:    Reason Eval/Treat Not Completed: Patient at procedure or test/ unavailable. Pt currently off unit at HD. OT to re-attempt when pt is next available.   Darleen Crocker, MS, OTR/L , CBIS ascom [phone]  08/18/21, 11:20 AM

## 2021-08-18 NOTE — Progress Notes (Signed)
Occupational Therapy Treatment Patient Details Name: Jocelyn Wells MRN: [id number] DOB: [birth year] Today's Date: 08/18/2021   History of present illness Jocelyn Wells is a 66 y.o. 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  Patient is status post peripheral angioplasty with successful recannulization of the left lower extremity for limb salvage. Patient also had a worsening renal function, developed volume overload, hyperkalemia, acute respiratory failure.  Temporary HD catheter was placed in the right femoral artery, dialyzed on 7/8.   OT comments  Upon entering the room, pt supine in bed with son present in the room. Pt having recently returned to room from HD and reports fatigue. Pt is agreeable to bed level exercises to strengthen B UEs with use of red theraband. OT demonstrates and pt performs 10 reps of shoulder elevation, shoulder diagonals, bicep curls, and alternating punches with min cuing for proper technique. Pt does fatigue quickly and needs cuing for pursed lip breathing during session. She is on 4Ls O2 this session. Pt is very pleasant and motivated throughout. OT left theraband in pt's room her her to attempt strengthening exercises on her own.   Recommendations for follow up therapy are one component of a multi-disciplinary discharge planning process, led by the attending physician.  Recommendations may be updated based on patient status, additional functional criteria and insurance  authorization.    Follow Up Recommendations  Home health OT    Assistance Recommended at Discharge Intermittent Supervision/Assistance  Patient can return home with the following  A little help with walking and/or transfers;A little help with bathing/dressing/bathroom;Assistance with cooking/housework;Help with stairs or ramp for entrance   Equipment Recommendations  Other (comment) (RW)       Precautions / Restrictions Precautions Precautions: Fall Precaution Comments: temporary R femoral catheter Restrictions Weight Bearing Restrictions: No              ADL either performed or assessed with clinical judgement    Extremity/Trunk Assessment Upper Extremity Assessment Upper Extremity Assessment: Generalized weakness   Lower Extremity Assessment Lower Extremity Assessment: Generalized weakness        Vision Patient Visual Report: No change from baseline            Cognition Arousal/Alertness: Awake/alert Behavior During Therapy: WFL for tasks assessed/performed, Flat affect Overall Cognitive Status: Within Functional Limits for tasks assessed                                 General Comments: Son is in room and offers encourgament to mother. Pt is slow to process but very pleasant and cooperative throughout.                   Pertinent Vitals/ Pain       Pain Assessment Pain Assessment: No/denies pain  Home Living Family/patient expects to be discharged to:: Private residence Living Arrangements: Spouse/significant other  Frequency  Min 2X/week        Progress Toward Goals  OT Goals(current goals can now be found in the care plan section)  Progress towards OT goals: Progressing toward goals  Acute Rehab OT Goals Patient Stated Goal: to go home OT Goal Formulation: With patient/family Time For Goal Achievement: 08/30/21 Potential to Achieve Goals: Good  Plan Discharge  plan remains appropriate;Frequency remains appropriate       AM-PAC OT "6 Clicks" Daily Activity     Outcome Measure   Help from another person eating meals?: None Help from another person taking care of personal grooming?: A Little Help from another person toileting, which includes using toliet, bedpan, or urinal?: A Lot Help from another person bathing (including washing, rinsing, drying)?: A Little Help from another person to put on and taking off regular upper body clothing?: A Little Help from another person to put on and taking off regular lower body clothing?: A Little 6 Click Score: 18    End of Session    OT Visit Diagnosis: Unsteadiness on feet (R26.81);Muscle weakness (generalized) (M62.81);Repeated falls (R29.6)   Activity Tolerance Patient tolerated treatment well   Patient Left in bed;with call bell/phone within reach;with bed alarm set;with family/visitor present   Nurse Communication Mobility status        Time: 2706-2376 OT Time Calculation (min): 23 min  Charges: OT General Charges $OT Visit: 1 Visit OT Treatments $Therapeutic Exercise: 23-37 mins  Darleen Crocker, MS, OTR/L , CBIS ascom [phone]  08/18/21, 2:53 PM

## 2021-08-18 NOTE — Evaluation (Signed)
Physical Therapy Evaluation Patient Details Name: Jocelyn Wells MRN: [id number] DOB: [birth year] Today's Date: 08/18/2021  History of Present Illness  Jocelyn Wells is a 66 y.o. 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  Patient is status post peripheral angioplasty with successful recannulization of the left lower extremity for limb salvage. Patient also had a worsening renal function, developed volume overload, hyperkalemia, acute respiratory failure.  Temporary HD catheter was placed in the right femoral artery, dialyzed on 7/8.    Clinical Impression  Pt received in Semi-Fowler's position and agreeable to therapy.  Pt currently still with temporary femoral catheter placed in the R LE, so any mobility of the R LE was deferred during evaluation and treatment session.  Pt performed L LE exercises with good technique and no concerns at this time.  Pt is somewhat fatigued from HD and had to be re-directed at times, but puts forth good effort.  Pt will continue to benefit from skilled therapy to address any concerns going forward.  Anticipate pt to return home with HHPT barring any complications after femoral catheter is removed.         Recommendations for follow up therapy are one component of a multi-disciplinary discharge planning process, led by the attending physician.  Recommendations may be updated based on patient status, additional functional criteria and insurance authorization.  Follow Up  Recommendations Home health PT Can patient physically be transported by private vehicle: No    Assistance Recommended at Discharge Frequent or constant Supervision/Assistance  Patient can return home with the following  A lot of help with walking and/or transfers;A lot of help with bathing/dressing/bathroom    Equipment Recommendations Rolling walker (2 wheels);BSC/3in1  Recommendations for Other Services       Functional Status Assessment Patient has had a recent decline in their functional status and demonstrates the ability to make significant improvements in function in a reasonable and predictable amount of time.     Precautions / Restrictions Precautions Precautions: Fall Precaution Comments: temporary R femoral catheter Restrictions Weight Bearing Restrictions: No      Mobility  Bed Mobility               General bed mobility comments: unable to perform due to temporary femoral catheter.    Transfers                        Ambulation/Gait                  Stairs            Wheelchair Mobility    Modified Rankin (Stroke Patients Only)       Balance       Sitting balance - Comments: unable to perform due to restrictions of temporary femoral catheter.       Standing balance comment: unable to observe                             Pertinent Vitals/Pain Pain Assessment Pain Assessment:  No/denies pain    Home Living Family/patient expects to be discharged to:: Private residence Living Arrangements: Spouse/significant other Available Help at Discharge: Family;Available 24 hours/day Type of Home: House Home Access: Stairs to enter Entrance Stairs-Rails: Can reach both;Right;Left Entrance Stairs-Number of Steps: 4   Home Layout: One level Home Equipment: Rollator (4 wheels);Cane - single point;Shower seat;Electric scooter      Prior Function Prior Level of Function : Needs assist             Mobility  Comments: Uses SPC or rollator, scooter for community distances. Reports at least 4 falls in previous 6 months. ADLs Comments: Reports Mod I in ADLs     Hand Dominance   Dominant Hand: Right    Extremity/Trunk Assessment   Upper Extremity Assessment Upper Extremity Assessment: Generalized weakness    Lower Extremity Assessment Lower Extremity Assessment: Generalized weakness       Communication   Communication: Expressive difficulties  Cognition Arousal/Alertness: Awake/alert Behavior During Therapy: WFL for tasks assessed/performed, Flat affect Overall Cognitive Status: Within Functional Limits for tasks assessed                                 General Comments: Son is in room and offers encourgament to mother. Pt is slow to process but very pleasant and cooperative throughout.        General Comments      Exercises Total Joint Exercises Ankle Circles/Pumps: AROM, Strengthening, Both, 10 reps, Supine Quad Sets: AROM, Strengthening, Both, 10 reps, Supine Gluteal Sets: AROM, Strengthening, Both, 10 reps, Supine Hip ABduction/ADduction: AROM, Strengthening, Left, 10 reps, Supine Straight Leg Raises: AROM, Strengthening, Left, 10 reps, Supine Other Exercises Other Exercises: Education regarding performance of exercises without using the R LE due to temporary femoral catheter.   Assessment/Plan    PT Assessment Patient needs continued PT services  PT Problem List Decreased strength;Decreased activity tolerance;Decreased balance;Decreased mobility       PT Treatment Interventions DME instruction;Gait training;Functional mobility training;Therapeutic activities;Therapeutic exercise;Balance training;Neuromuscular re-education    PT Goals (Current goals can be found in the Care Plan section)  Acute Rehab PT Goals Patient Stated Goal: to get stronger. PT Goal Formulation: With patient Time For Goal Achievement: 09/01/21 Potential to Achieve Goals:  Good    Frequency Min 2X/week     Co-evaluation               AM-PAC PT "6 Clicks" Mobility  Outcome Measure Help needed turning from your back to your side while in a flat bed without using bedrails?: A Lot Help needed moving from lying on your back to sitting on the side of a flat bed without using bedrails?: A Lot Help needed moving to and from a bed to a chair (including a wheelchair)?: A Lot Help needed standing up from a chair using your arms (e.g., wheelchair or bedside chair)?: A Lot Help needed to walk in hospital room?: A Lot Help needed climbing 3-5 steps with a railing? : A Lot 6 Click Score: 12    End of Session   Activity Tolerance: Patient tolerated treatment well Patient left: in bed;with call bell/phone within reach;with bed alarm set;with family/visitor present Nurse Communication: Mobility status PT Visit Diagnosis: Unsteadiness on feet (R26.81);Other abnormalities of gait and mobility (R26.89);Repeated falls (R29.6);Muscle weakness (generalized) (M62.81);History of falling (Z91.81);Difficulty in walking, not elsewhere classified (R26.2)    Time: 0814-4818 PT Time Calculation (min) (ACUTE ONLY):  12 min   Charges:   PT Evaluation $PT Eval Low Complexity: 1 Low PT Treatments $Therapeutic Exercise: 8-22 mins        Gwenlyn Saran, PT, DPT 08/18/21, 5:02 PM   Christie Nottingham 08/18/2021, 4:58 PM

## 2021-08-18 NOTE — Progress Notes (Signed)
  Progress Note   Patient: Jocelyn Wells YSA:[id number] DOB: [birth year] DOA: 08/12/2021     6 DOS: the patient was seen and examined on 08/18/2021   Brief hospital course: Jocelyn Wells is a 66 y.o. 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 Patient is status post peripheral angioplasty with successful recannulization of the left lower extremity for limb salvage.  Patient also had a worsening renal function, developed volume overload, hyperkalemia, acute respiratory failure. Temporary HD catheter was placed in the right femoral artery, dialyzed on 7/8.  Assessment and Plan: Acute respiratory failure with hypoxemia secondary to volume overload. End-stage renal disease with volume overload. Hyperkalemia Acute on chronic diastolic congestive heart failure. Patient will receive hemodialysis again today, condition improving. Patient still makes significant amount of urine, she may not have end-stage renal disease, she may have chronic kidney disease stage 4 with acute kidney injury.  We will make a final determination before discharge.    Peripheral arterial disease status post intervention. Continue atorvastatin.   Coffee-ground emesis. Hemoglobin stable, continue PPI.  May transition to oral tomorrow.   Type 2 diabetes. Continue Levemir and sliding scale insulin          Subjective:  Patient doing well today, still on oxygen, short of breath is better.  No cough.  Physical  Exam: Vitals:   08/18/21 1115 08/18/21 1130 08/18/21 1145 08/18/21 1151  BP: (!) 162/81 134/80 (!) 151/74   Pulse: 87 89 85   Resp: (!) 22 (!) 29 (!) 23   Temp:      TempSrc:      SpO2: 96% 96% 96%   Weight:      Height:    '5\' 6"'$  (1.676 m)   General exam: Appears calm and comfortable  Respiratory system: Decreased breathing sounds with a few crackles. Respiratory effort normal. Cardiovascular system: S1 & S2 heard, RRR. No JVD, murmurs, rubs, gallops or clicks. No pedal edema. Gastrointestinal system: Abdomen is nondistended, soft and nontender. No organomegaly or masses felt. Normal bowel sounds heard. Central nervous system: Alert and oriented. No focal neurological deficits. Extremities: Symmetric 5 x 5 power. Skin: No rashes, lesions or ulcers Psychiatry: Judgement and insight appear normal. Mood & affect appropriate.   Data Reviewed:  Lab results reviewed.  Family Communication: Son updated at bedside  Disposition: Status is: Inpatient Remains inpatient appropriate because: Severity of disease, inpatient procedures.  Planned Discharge Destination: Home with Home Health    Time spent: 35 minutes  Author: Sharen Hones, MD 08/18/2021 12:01 PM  For on call review [email or web address].

## 2021-08-18 NOTE — Progress Notes (Signed)
[facility]  Daily Progress Note   Subjective  -   Patient resting with family, eating dinner although decreased appetite. She is not able to do a full dialysis session today due to issues with her PermCath.  Objective Vitals:   08/17/21 1638 08/17/21 2003 08/17/21 2020 08/17/21 2345  BP: 134/66 (!) 164/84 (!) 145/63 (!) 172/88  Pulse: 81 81 77 84  Resp: (!) 22 (!) '30 18 19  '$ Temp: 98 F (36.7 C) 98.4 F (36.9 C) 98 F (36.7 C) 98.7 F (37.1 C)  TempSrc:  Oral  Oral  SpO2: 95% 97% 93% 96%  Weight:        Intake/Output Summary (Last 24 hours) at 08/18/2021 0137 Last data filed at 08/17/2021 2345 Gross per 24 hour  Intake 240 ml  Output 999 ml  Net -759 ml    PULM  CTAB CV  RRR VASC  2+ DP/PT left lower extremity  Laboratory CBC    Component Value Date/Time   WBC 13.3 (H) 08/17/2021 0217   HGB 11.4 (L) 08/17/2021 0217   HGB 12.8 10/20/2020 1554   HCT 38.8 08/17/2021 0217   HCT 41.2 10/20/2020 1554   PLT 444 (H) 08/17/2021 0217   PLT 415 10/20/2020 1554    BMET    Component Value Date/Time   NA 138 08/17/2021 0217   NA 141 10/20/2020 1554   K 5.1 08/17/2021 0217   CL 103 08/17/2021 0217   CO2 29 08/17/2021 0217   GLUCOSE 213 (H) 08/17/2021 0217   BUN 48 (H) 08/17/2021 0217   BUN 37 (H) 10/20/2020 1554   CREATININE 2.74 (H) 08/17/2021 0217   CREATININE 1.37 (H) 03/21/2015 1451   CALCIUM 8.7 (L) 08/17/2021 0217   GFRNONAA 18 (L) 08/17/2021 0217   GFRAA 33 (L) 04/23/2019 1446    Assessment/Planning: POD #6 s/p left lower extremity angiogram  The patient's left lower extremity wound is beginning to show improvement, patient has warm feet with palpable pulses bilaterally. Patient was not able to undergo full dialysis session today.  Patient reports issues with her catheter during dialysis.  Patient has ongoing issues may consider exchange pending feedback from nephrology.  Kris Hartmann  08/18/2021, 1:37 AM

## 2021-08-18 NOTE — Progress Notes (Signed)
[facility]  ROUNDING NOTE   Subjective:   Jocelyn Wells is a 66 year old female with past medical conditions including COPD, depression, diabetes, hypertension, stroke, and chronic kidney disease stage IV.  Patient presents to the hospital for scheduled procedure in vascular lab.  Patient was admitted for Atherosclerosis of native arteries of the extremities with ulceration [facility]) [I70.25]  Patient is known to our practice and is followed by Dr.Korrapati outpatient.  Patient was last seen in office on 07/30/2021.   Patient seen sitting up in bed Completed breakfast tray at bedside Family at bedside Respiratory status appears improved, less effort Improved lower extremity edema.  UOP 477m overnight  Patient evaluated later during dialysis   HEMODIALYSIS FLOWSHEET:  Blood Flow Rate (mL/min): 300 mL/min Arterial Pressure (mmHg): -180 mmHg Venous Pressure (mmHg): 160 mmHg Transmembrane Pressure (mmHg): 40 mmHg Ultrafiltration Rate (mL/min): 500 mL/min Dialysate Flow Rate (mL/min): 500 ml/min Conductivity: 13.8 Conductivity: 13.8 Dialysis Fluid Bolus: Normal Saline Bolus Amount (mL): 250 mL  Tolerating treatment well  Objective:  Vital signs in last 24 hours:  Temp:  [97.9 F (36.6 C)-98.7 F (37.1 C)] 97.9 F (36.6 C) (07/11 1259) Pulse Rate:  [73-89] 79 (07/11 1301) Resp:  [8-39] 19 (07/11 1301) BP: (129-175)/(56-118) 131/70 (07/11 1301) SpO2:  [93 %-100 %] 99 % (07/11 1301) Weight:  [100 kg-102.5 kg] 100 kg (07/11 1259)  Weight change:  Filed Weights   08/18/21 0928 08/18/21 0934 08/18/21 1259  Weight: 101.3 kg 101.3 kg 100 kg    Intake/Output: I/O last 3 completed shifts: In: 240 [P.O.:240] Out: 15009[Urine:1200; Other:599]   Intake/Output this shift:  Total I/O In: -  Out: 1000 [Other:1000]  Physical Exam: General: NAD  Head: Normocephalic, atraumatic.  Dry oral mucosal membranes  Eyes: Anicteric  Lungs:  Diminished breath sounds, [place] O2   Heart: Regular rate and rhythm  Abdomen:  Soft, nontender, obese  Extremities: trace peripheral edema.  Neurologic: Nonfocal, moving all four extremities  Skin: No lesions  Access: Temporary catheter in situ    Basic Metabolic Panel: Recent Labs  Lab 08/13/21 1715 08/14/21 0552 08/14/21 0818 08/15/21 0732 08/15/21 1243 08/16/21 0432 08/17/21 0217 08/18/21 0338  NA 137 136  --   --   --  137 138 137  K 5.5* 5.8*   < > 5.3* 4.5 4.9 5.1 4.7  CL 100 102  --   --   --  102 103 102  CO2 29 27  --   --   --  '28 29 27  '$ GLUCOSE 202* 243*  --   --   --  199* 213* 224*  BUN 57* 60*  --   --   --  44* 48* 39*  CREATININE 4.07* 4.13*  --   --   --  2.80* 2.74* 2.20*  CALCIUM 8.4* 8.4*  --   --   --  8.4* 8.7* 8.6*  MG  --   --   --   --   --  1.9  --   --   PHOS  --   --   --   --   --   --  3.8  --    < > = values in this interval not displayed.     Liver Function Tests: Recent Labs  Lab 08/17/21 0217  ALBUMIN 3.0*    No results for input(s): "LIPASE", "AMYLASE" in the last 168 hours. No results for input(s): "AMMONIA" in the last 168 hours.  CBC:  Recent Labs  Lab 08/13/21 0525 08/14/21 0552 08/14/21 1819 08/15/21 2246 08/16/21 0432 08/16/21 0647 08/17/21 0217 08/18/21 0338  WBC 14.9* 14.4*  --   --  13.6*  --  13.3* 13.8*  HGB 11.8* 11.1*   < > 10.8* 11.2* 11.0* 11.4* 11.4*  HCT 41.0 38.4   < > 36.3 37.3 37.7 38.8 37.9  MCV 85.8 84.6  --   --  84.0  --  84.0 83.7  PLT 477* 449*  --   --  438*  --  444* 516*   < > = values in this interval not displayed.     Cardiac Enzymes: No results for input(s): "CKTOTAL", "CKMB", "CKMBINDEX", "TROPONINI" in the last 168 hours.  BNP: Invalid input(s): "POCBNP"  CBG: Recent Labs  Lab 08/17/21 1640 08/17/21 2013 08/17/21 2347 08/18/21 0423 08/18/21 0753  GLUCAP 207* 215* 232* 217* 229*     Microbiology: Results for orders placed or performed in visit on 07/29/21  Aerobic culture     Status: None   Collection  Time: 07/29/21 10:45 AM  Result Value Ref Range Status   Aerobic Bacterial Culture Final report  Final   Organism ID, Bacteria Routine flora  Final    Comment: Scant growth    Coagulation Studies: No results for input(s): "LABPROT", "INR" in the last 72 hours.   Urinalysis: No results for input(s): "COLORURINE", "LABSPEC", "PHURINE", "GLUCOSEU", "HGBUR", "BILIRUBINUR", "KETONESUR", "PROTEINUR", "UROBILINOGEN", "NITRITE", "LEUKOCYTESUR" in the last 72 hours.  Invalid input(s): "APPERANCEUR"    Imaging: No results found.   Medications:    sodium chloride     sodium chloride      apixaban  5 mg Oral BID   atorvastatin  40 mg Oral Daily   Chlorhexidine Gluconate Cloth  6 each Topical Q0600   clopidogrel  75 mg Oral Daily   insulin aspart  0-9 Units Subcutaneous TID WC   insulin detemir  20 Units Subcutaneous Daily   mouth rinse  15 mL Mouth Rinse 4 times per day   pantoprazole  40 mg Oral BID   PARoxetine  40 mg Oral Daily   senna-docusate  2 tablet Oral BID   sodium chloride flush  3 mL Intravenous Q12H   sodium chloride, acetaminophen, albuterol, alteplase, diphenhydrAMINE, famotidine, heparin, HYDROmorphone (DILAUDID) injection, lidocaine (PF), lidocaine-prilocaine, metoCLOPramide (REGLAN) injection, midazolam, ondansetron (ZOFRAN) IV, ondansetron (ZOFRAN) IV, mouth rinse, oxyCODONE, pentafluoroprop-tetrafluoroeth, sodium chloride flush  Assessment/ Plan:  [name] is a 66 y.o.  female with past medical conditions including COPD, depression, diabetes, hypertension, stroke, and chronic kidney disease stage IV.  Patient presents to the hospital for scheduled procedure in vascular lab.  Patient was admitted for Atherosclerosis of native arteries of the extremities with ulceration (HCC) [I70.25]   Acute Kidney Injury on chronic kidney disease stage IV with baseline creatinine 2.5 and GFR of 20 on 07/30/21.  Acute kidney injury secondary to multiple factors including  IV contrast and cardiorenal Chronic kidney disease is secondary to diabetes and hypertension Losartan currently held along with diuretics.  IV contrast given during angiogram.  Renal ultrasound ordered.  Patient has multiple factors at play resulting in current kidney failure.    Received third dialysis treatment today, UF goal 1 L as tolerated.  Respiratory status has improved.  We will place order for nursing to remove HD temp cath today and monitor for renal recovery.  We will monitor renal function and urine output closely.   Lab Results  Component Value Date  CREATININE 2.20 (H) 08/18/2021   CREATININE 2.74 (H) 08/17/2021   CREATININE 2.80 (H) 08/16/2021    Intake/Output Summary (Last 24 hours) at 08/18/2021 1305 Last data filed at 08/18/2021 1249 Gross per 24 hour  Intake --  Output 1999 ml  Net -1999 ml    2. Anemia of chronic kidney disease Lab Results  Component Value Date   HGB 11.4 (L) 08/18/2021    Hemoglobin remains within acceptable range  3. Secondary Hyperparathyroidism: with outpatient labs: PTH 187 on 07/30/21.   Lab Results  Component Value Date   CALCIUM 8.6 (L) 08/18/2021   CAION 1.08 (L) 01/29/2015   PHOS 3.8 08/17/2021    Calcium and phosphorus remain within acceptable range.  We will continue to monitor  4.  Hypertension with chronic kidney disease.  Blood pressure stable, 131/70   5. Diabetes mellitus type II with chronic kidney disease: insulin dependent. Home regimen includes NovoLog with an insulin pump. Most recent hemoglobin A1c is 7.9 on 07/14/21.    6. Hyperkalemia Potassium corrected with dialysis    LOS: 6   7/11/20231:05 PM

## 2021-08-18 NOTE — Progress Notes (Signed)
Hemodialysis Post Treatment Note:  Tx date:08/18/2021 Tx time: 3 hours Access: right femoral catheter UF Removed: 1 L  Note: HD treatment completed. Tolerated well. No HD complications noted. Patient asymptomatic.

## 2021-08-19 ENCOUNTER — Other Ambulatory Visit: Payer: Medicare Other

## 2021-08-19 DIAGNOSIS — N186 End stage renal disease: Secondary | ICD-10-CM | POA: Diagnosis not present

## 2021-08-19 DIAGNOSIS — I5033 Acute on chronic diastolic (congestive) heart failure: Secondary | ICD-10-CM | POA: Diagnosis not present

## 2021-08-19 DIAGNOSIS — J9601 Acute respiratory failure with hypoxia: Secondary | ICD-10-CM | POA: Diagnosis not present

## 2021-08-19 DIAGNOSIS — Z992 Dependence on renal dialysis: Secondary | ICD-10-CM | POA: Diagnosis not present

## 2021-08-19 LAB — RENAL FUNCTION PANEL
Albumin: 3 g/dL — ABNORMAL LOW (ref 3.5–5.0)
Anion gap: 8 (ref 5–15)
BUN: 32 mg/dL — ABNORMAL HIGH (ref 8–23)
CO2: 25 mmol/L (ref 22–32)
Calcium: 8.7 mg/dL — ABNORMAL LOW (ref 8.9–10.3)
Chloride: 102 mmol/L (ref 98–111)
Creatinine, Ser: 2.27 mg/dL — ABNORMAL HIGH (ref 0.44–1.00)
GFR, Estimated: 23 mL/min — ABNORMAL LOW (ref >60)
Glucose, Bld: 203 mg/dL — ABNORMAL HIGH (ref 70–99)
Phosphorus: 3.1 mg/dL (ref 2.5–4.6)
Potassium: 4.1 mmol/L (ref 3.5–5.1)
Sodium: 135 mmol/L (ref 135–145)

## 2021-08-19 LAB — GLUCOSE, CAPILLARY
Glucose-Capillary: 188 mg/dL — ABNORMAL HIGH (ref 70–99)
Glucose-Capillary: 209 mg/dL — ABNORMAL HIGH (ref 70–99)
Glucose-Capillary: 247 mg/dL — ABNORMAL HIGH (ref 70–99)
Glucose-Capillary: 264 mg/dL — ABNORMAL HIGH (ref 70–99)
Glucose-Capillary: 308 mg/dL — ABNORMAL HIGH (ref 70–99)

## 2021-08-19 MED ORDER — CARVEDILOL 12.5 MG PO TABS
12.5000 mg | ORAL_TABLET | Freq: Two times a day (BID) | ORAL | Status: DC
  Administered 2021-08-19 – 2021-08-23 (×8): 12.5 mg via ORAL
  Filled 2021-08-19 (×8): qty 1

## 2021-08-19 NOTE — Progress Notes (Signed)
Occupational Therapy Treatment Patient Details Name: Jocelyn Jocelyn MRN: [id number] DOB: [birth year] Today's Date: 08/19/2021   History of present illness Jocelyn Jocelyn is a 66 y.o. 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  Patient is status post peripheral angioplasty with successful recannulization of the left lower extremity for limb salvage. Patient also had a worsening renal function, developed volume overload, hyperkalemia, acute respiratory failure.  Temporary HD catheter was placed in the right femoral artery, dialyzed on 7/8.   OT comments  Upon entering the room, pt supine in bed and agreeable to OT intervention. Pt's son present in room and pt reports feeling much better today. Pt processing information faster today and still needs increased time but more engaged in task. Min guard for supine >sit with HOB elevated. Pt sitting on EOB with supervision for balance. Pt needing min cuing for hand placement and technique to stand from standard height bed with min A and take several steps to the L into recliner chair. Pt on 3Ls via [place] and needing cuing for pursed lip breathing with activity. Pt's O2 saturation remained at or above 92% this session. OT discussing plan with pt to stay up for ~ 2 hours to eat dinner in chair before returning to bed. All needs within reach and bed alarm activated with son present in room.    Recommendations for follow up therapy are one component of a multi-disciplinary  discharge planning process, led by the attending physician.  Recommendations may be updated based on patient status, additional functional criteria and insurance authorization.    Follow Up Recommendations  Home health OT    Assistance Recommended at Discharge Intermittent Supervision/Assistance  Patient can return home with the following  A little help with walking and/or transfers;A little help with bathing/dressing/bathroom;Assistance with cooking/housework;Help with stairs or ramp for entrance   Equipment Recommendations  Other (comment) (RW)       Precautions / Restrictions Precautions Precautions: Fall Precaution Comments: catheter removed Restrictions Weight Bearing Restrictions: No       Mobility Bed Mobility Overal bed mobility: Needs Assistance Bed Mobility: Supine to Sit     Supine to sit: Min guard, HOB elevated     General bed mobility comments: pt able to perform with HOB elevated.    Transfers Overall transfer level: Needs assistance Equipment used: Rolling walker (2 wheels) Transfers: Sit to/from Stand Sit to Stand: Min assist           General transfer comment: min A from standard height bed     Balance Overall balance assessment: Needs assistance Sitting-balance support: Bilateral upper extremity supported, Feet supported Sitting balance-Leahy Scale: Good     Standing balance support: Bilateral upper extremity supported, Reliant on assistive device for balance Standing balance-Leahy Scale: Fair Standing balance comment: pt needing constant UE support for balance.                           ADL either performed or assessed with clinical judgement    Extremity/Trunk Assessment Upper Extremity Assessment Upper Extremity  Assessment: Generalized weakness   Lower Extremity Assessment Lower Extremity Assessment: Generalized weakness        Vision Patient Visual Report: No change from baseline            Cognition  Arousal/Alertness: Awake/alert Behavior During Therapy: WFL for tasks assessed/performed Overall Cognitive Status: Within Functional Limits for tasks assessed                                 General Comments: Son remains in room and pt is much more responsive and appears to be feeling better.                   Pertinent Vitals/ Pain       Pain Assessment Pain Assessment: No/denies pain         Frequency  Min 2X/week        Progress Toward Goals  OT Goals(current goals can now be found in the care plan section)  Progress towards OT goals: Progressing toward goals  Acute Rehab OT Goals Patient Stated Goal: to go home OT Goal Formulation: With patient/family Time For Goal Achievement: 08/30/21 Potential to Achieve Goals: Good  Plan Discharge plan remains appropriate;Frequency remains appropriate       AM-PAC OT "6 Clicks" Daily Activity     Outcome Measure   Help from another person eating meals?: None Help from another person taking care of personal grooming?: A Little Help from another person toileting, which includes using toliet, bedpan, or urinal?: A Little Help from another person bathing (including washing, rinsing, drying)?: A Little Help from another person to put on and taking off regular upper body clothing?: None Help from another person to put on and taking off regular lower body clothing?: A Little 6 Click Score: 20    End of Session Equipment Utilized During Treatment: Rolling walker (2 wheels)  OT Visit Diagnosis: Unsteadiness on feet (R26.81);Muscle weakness (generalized) (M62.81);Repeated falls (R29.6)   Activity Tolerance Patient tolerated treatment well   Patient Left with call bell/phone within reach;with family/visitor present;in chair;with chair alarm set   Nurse Communication Mobility status        Time: 9476-5465 OT Time Calculation (min): 13 min  Charges: OT General Charges $OT Visit: 1 Visit OT  Treatments $Therapeutic Activity: 8-22 mins  Darleen Crocker, MS, OTR/L , CBIS ascom [phone]  08/19/21, 4:07 PM

## 2021-08-19 NOTE — Progress Notes (Signed)
Physical Therapy Treatment Patient Details Name: Jocelyn Wells MRN: [id number] DOB: Nov 07, 1955 Today's Date: 08/19/2021   History of Present Illness Jocelyn Wells is a 66 y.o. 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  Patient is status post peripheral angioplasty with successful recannulization of the left lower extremity for limb salvage. Patient also had a worsening renal function, developed volume overload, hyperkalemia, acute respiratory failure.  Temporary HD catheter was placed in the right femoral artery, dialyzed on 7/8.    PT Comments    Pt received in supine position and agreeable to therapy.  Pt very lethargic throughout beginning of session, however attempts to participate in therapy with good effort.  Pt performed transfer to sitting with good technique and heavy use of the handrail.  Pt safe throughout session with slow, steady movements.  Pt did have a BM that was noted prior to therapist arrival.  Pt participate in transfer to standing with elevated bed in order to assist with standing.  Pt unable to perform with lowered bed during initial attempt, then performed x5 with elevated.  While in standing, pt was assisted with peri-care due to having BM in bed prior to therapist arrival.  New sheets and pads were placed.  Pt stood ~3 minutes for peri-care, braced on sink with UE support.  Pt took seated rest break, then performed standing marches and side steps at bed, along with 2  forward/backwards steps.  Pt then assisted in bed, when pt noted to have another BM and voiding upon end of therapy session.  Pt assisted with log roll for peri-care with new chuck pad, gown, and purewick placed.  Current discharge plans updated to SNF due to instability and LE weakness at this moment in time.  Pt could progress to d/c home during hospital stay, but updated recommendation to SNF is most appropriate at this time.  Pt will continue to benefit from skilled therapy in order to address deficits listed below.     Recommendations for follow up therapy are one component of a multi-disciplinary discharge planning process, led by the attending physician.  Recommendations may be updated based on patient status, additional functional criteria and insurance authorization.  Follow Up Recommendations  Skilled nursing-short term rehab (<3 hours/day) Can patient physically be transported by private vehicle: No   Assistance Recommended at Discharge Frequent or constant Supervision/Assistance  Patient can return home with the following A lot of help with walking and/or transfers;A lot of help with bathing/dressing/bathroom   Equipment Recommendations  Rolling walker (2 wheels);BSC/3in1    Recommendations for Other Services       Precautions / Restrictions Precautions Precautions: Fall Precaution Comments: catheter removed Restrictions Weight Bearing Restrictions: No     Mobility  Bed Mobility Overal bed mobility: Needs Assistance Bed Mobility: Supine to Sit, Sit to Supine     Supine to sit: Min guard, HOB elevated Sit to supine: Min guard, HOB elevated   General bed mobility comments: pt able to perform transfers with [facility] elevated. Patient Response: Flat affect  Transfers Overall transfer level: Needs assistance Equipment used:  Rolling walker (2 wheels) Transfers: Sit to/from Stand Sit to Stand: Min guard, From elevated surface           General transfer comment: Pt able  to perform transfer with elevated bed and verbal cuing for hand placement.    Ambulation/Gait         Gait velocity: decreased     General Gait Details: deferred formal gait, but is able to take 2 steps forward and backwards and also side steps to the [facility].   Stairs             Wheelchair Mobility    Modified Rankin (Stroke Patients Only)       Balance Overall balance assessment: Needs assistance Sitting-balance support: Bilateral upper extremity supported Sitting balance-Leahy Scale: Fair     Standing balance support: Bilateral upper extremity supported, Reliant on assistive device for balance Standing balance-Leahy Scale: Fair Standing balance comment: pt needing constant UE support for balance.                            Cognition Arousal/Alertness: Awake/alert Behavior During Therapy: WFL for tasks assessed/performed, Flat affect Overall Cognitive Status: Within Functional Limits for tasks assessed                                 General Comments: Son in room upon arrival.  Pt slow with response and seems lethargic throughout session.        Exercises      General Comments        Pertinent Vitals/Pain Pain Assessment Pain Assessment: No/denies pain    Home Living                          Prior Function            PT Goals (current goals can now be found in the care plan section) Acute Rehab PT Goals Patient Stated Goal: to get stronger. PT Goal Formulation: With patient Time For Goal Achievement: 09/01/21 Potential to Achieve Goals: Good Progress towards PT goals: Not progressing toward goals - comment (pt is much weaker than when originally evaluated by OT and has functional deficits that pt would benefit from transferring to SNF.)    Frequency    Min 2X/week      PT Plan Discharge plan needs to be updated    Co-evaluation              AM-PAC PT "6 Clicks" Mobility   Outcome  Measure  Help needed turning from your back to your side while in a flat bed without using bedrails?: A Lot Help needed moving from lying on your back to sitting on the side of a flat bed without using bedrails?: A Lot Help needed moving to and from a bed to a chair (including a wheelchair)?: A Lot Help needed standing up from a chair using your arms (e.g., wheelchair or bedside chair)?: A Lot Help needed to walk in hospital room?: A Lot Help needed climbing 3-5 steps with a railing? : A Lot 6 Click Score: 12    End of Session   Activity Tolerance: Patient tolerated treatment well Patient left: in bed;with call bell/phone within reach;with bed alarm set;with family/visitor present Nurse Communication: Mobility status PT Visit Diagnosis: Unsteadiness on feet (R26.81);Other abnormalities of gait and mobility (R26.89);Repeated falls (R29.6);Muscle weakness (generalized) (  M62.81);History of falling (Z91.81);Difficulty in walking, not elsewhere classified (R26.2)     Time: 4982-6415 PT Time Calculation (min) (ACUTE ONLY): 41 min  Charges:  $Therapeutic Activity: 38-52 mins                     Gwenlyn Saran, PT, DPT 08/19/21, 2:56 PM    Jocelyn Wells 08/19/2021, 2:37 PM

## 2021-08-19 NOTE — Progress Notes (Signed)
Non-tunneled HD cath removal with RN at bedside. Educated on proper removal procedure and holding pressure, pressure gauze and Vaseline gauze to site. Assisted to decrease any potential complications and maintain homeostasis. Pt tolerated well with no issues.

## 2021-08-19 NOTE — Progress Notes (Signed)
  Progress Note   Patient: Jocelyn Wells ENI:[id number] DOB: 1955-03-13 DOA: 08/12/2021     7 DOS: the patient was seen and examined on 08/19/2021   Brief hospital course: ALYSSIA HEESE is a 66 y.o. 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 Patient is status post peripheral angioplasty with successful recannulization of the left lower extremity for limb salvage.  Patient also had a worsening renal function, developed volume overload, hyperkalemia, acute respiratory failure. Temporary HD catheter was placed in the right femoral artery, dialyzed on 7/8. 7/12.  Volume status much better, temporary dialysis catheter removed.  Assessment and Plan: Acute respiratory failure with hypoxemia secondary to volume overload. End-stage renal disease with volume overload. Hyperkalemia Acute on chronic diastolic congestive heart failure. Patient still makes significant amount of urine, she may not have end-stage renal disease, she may have chronic kidney disease stage 4 with acute kidney injury.  We will make a final determination before discharge. Patient volume status continues to improve, still making large amount of urine.  Kidney functions better.  Nephrology has ordered to remove temporary HD catheter, will continue monitor renal function.  Wean oxygen.   Peripheral arterial disease status post intervention. Continue atorvastatin.   Coffee-ground emesis. Condition resolved.  Hemoglobin stable   Type  2 diabetes. Continue Levemir and sliding scale insulin          Subjective:  Patient feels well today, still requiring 4 L oxygen, I dropped down to 3 L.  She slept well last night, no short of breath today.  Physical Exam: Vitals:   08/18/21 1958 08/18/21 2346 08/19/21 0446 08/19/21 0907  BP: 130/87 (!) 146/81 (!) 165/78 (!) 169/75  Pulse: 81 82 81 82  Resp: '17 20 20 18  '$ Temp: 97.6 F (36.4 C) 98.1 F (36.7 C) 98.5 F (36.9 C) 97.8 F (36.6 C)  TempSrc: Oral Oral    SpO2: 98% 94% 95% 95%  Weight:      Height:       General exam: Appears calm and comfortable  Respiratory system: Clear to auscultation. Respiratory effort normal. Cardiovascular system: S1 & S2 heard, RRR. No JVD, murmurs, rubs, gallops or clicks. No pedal edema. Gastrointestinal system: Abdomen is nondistended, soft and nontender. No organomegaly or masses felt. Normal bowel sounds heard. Central nervous system: Alert and oriented. No focal neurological deficits. Extremities: Symmetric 5 x 5 power. Skin: No rashes, lesions or ulcers Psychiatry: Judgement and insight appear normal. Mood & affect appropriate.   Data Reviewed:  Lab results reviewed.  Family Communication: Son updated at bedside.  Disposition: Status is: Inpatient Due to severity of disease.  Planned Discharge Destination: Home with Home Health    Time spent: 35 minutes  Author: Sharen Hones, MD 08/19/2021 11:42 AM  For on call review [email or web address].

## 2021-08-19 NOTE — Inpatient Diabetes Management (Signed)
Inpatient Diabetes Program Recommendations  AACE/ADA: New Consensus Statement on Inpatient Glycemic Control   Target Ranges:  Prepandial:   less than 140 mg/dL      Peak postprandial:   less than 180 mg/dL (1-2 hours)      Critically ill patients:  140 - 180 mg/dL    Latest Reference Range & Units 08/19/21 03:56 08/19/21 09:02  Glucose-Capillary 70 - 99 mg/dL 188 (H) 209 (H)    Latest Reference Range & Units 08/18/21 07:53 08/18/21 13:34 08/18/21 17:06 08/18/21 20:31 08/18/21 23:56  Glucose-Capillary 70 - 99 mg/dL 229 (H) 196 (H) 288 (H) 248 (H) 216 (H)   Review of Glycemic Control  Diabetes history: DM2 Outpatient Diabetes medications: Omnipod insulin pump with Novolog, FreeStyle Libre CGM, Trulicity 3 mg Qweek Current orders for Inpatient glycemic control: Levemir 20 units daily, Novolog 0-9 units TID with meals   Inpatient Diabetes Program Recommendations:     Insulin: Please consider increasing Levemir to 22 units daily, adding Novolog 0-5 units QHS, and ordering Novolog 3 units TID with meals for meal coverage if patient eats at least 50% of meals.   Thanks, Barnie Alderman, RN, MSN, [place] [facility] Coordinator Inpatient Diabetes Program [phone] (Team Pager from 8am to [place])

## 2021-08-19 NOTE — Progress Notes (Signed)
[facility]  ROUNDING NOTE   Subjective:   Jocelyn Wells is a 66 year old female with past medical conditions including COPD, depression, diabetes, hypertension, stroke, and chronic kidney disease stage IV.  Patient presents to the hospital for scheduled procedure in vascular lab.  Patient was admitted for Atherosclerosis of native arteries of the extremities with ulceration [facility]) [I70.25]  Patient is known to our practice and is followed by Dr.Korrapati outpatient.  Patient was last seen in office on 07/30/2021.   Patient seen sitting up in bed Alert and oriented Family at bedside Tolerating meals, appetite improving Breathing feels back to baseline, however patient states she has anxiety and panic attacks.  Creatinine 2.27   Objective:  Vital signs in last 24 hours:  Temp:  [97.6 F (36.4 C)-98.5 F (36.9 C)] 98 F (36.7 C) (07/12 1217) Pulse Rate:  [81-88] 82 (07/12 1217) Resp:  [16-20] 16 (07/12 1217) BP: (130-169)/(72-88) 166/88 (07/12 1217) SpO2:  [94 %-98 %] 96 % (07/12 1217)  Weight change: -2.1 kg Filed Weights   08/18/21 0928 08/18/21 0934 08/18/21 1259  Weight: 101.3 kg 101.3 kg 100 kg    Intake/Output: I/O last 3 completed shifts: In: 180 [P.O.:180] Out: 1800 [Urine:800; Other:1000]   Intake/Output this shift:  No intake/output data recorded.  Physical Exam: General: NAD  Head: Normocephalic, atraumatic.  Dry oral mucosal membranes  Eyes: Anicteric  Lungs:  Diminished breath sounds, [place] O2  Heart: Regular rate and rhythm  Abdomen:  Soft, nontender, obese  Extremities: trace peripheral edema.  Neurologic: Nonfocal, moving all four extremities  Skin: No lesions  Access: Temporary catheter in situ    Basic Metabolic Panel: Recent Labs  Lab 08/14/21 0552 08/14/21 0818 08/15/21 1243 08/16/21 0432 08/17/21 0217 08/18/21 0338 08/19/21 0849  NA 136  --   --  137 138 137 135  K 5.8*   < > 4.5 4.9 5.1 4.7 4.1  CL 102  --   --  102 103  102 102  CO2 27  --   --  '28 29 27 25  '$ GLUCOSE 243*  --   --  199* 213* 224* 203*  BUN 60*  --   --  44* 48* 39* 32*  CREATININE 4.13*  --   --  2.80* 2.74* 2.20* 2.27*  CALCIUM 8.4*  --   --  8.4* 8.7* 8.6* 8.7*  MG  --   --   --  1.9  --   --   --   PHOS  --   --   --   --  3.8  --  3.1   < > = values in this interval not displayed.     Liver Function Tests: Recent Labs  Lab 08/17/21 0217 08/19/21 0849  ALBUMIN 3.0* 3.0*    No results for input(s): "LIPASE", "AMYLASE" in the last 168 hours. No results for input(s): "AMMONIA" in the last 168 hours.  CBC: Recent Labs  Lab 08/13/21 0525 08/14/21 0552 08/14/21 1819 08/15/21 2246 08/16/21 0432 08/16/21 0647 08/17/21 0217 08/18/21 0338  WBC 14.9* 14.4*  --   --  13.6*  --  13.3* 13.8*  HGB 11.8* 11.1*   < > 10.8* 11.2* 11.0* 11.4* 11.4*  HCT 41.0 38.4   < > 36.3 37.3 37.7 38.8 37.9  MCV 85.8 84.6  --   --  84.0  --  84.0 83.7  PLT 477* 449*  --   --  438*  --  444* 516*   < > =  values in this interval not displayed.     Cardiac Enzymes: No results for input(s): "CKTOTAL", "CKMB", "CKMBINDEX", "TROPONINI" in the last 168 hours.  BNP: Invalid input(s): "POCBNP"  CBG: Recent Labs  Lab 08/18/21 2031 08/18/21 2356 08/19/21 0356 08/19/21 0902 08/19/21 1211  GLUCAP 248* 216* 188* 209* 247*     Microbiology: Results for orders placed or performed in visit on 07/29/21  Aerobic culture     Status: None   Collection Time: 07/29/21 10:45 AM  Result Value Ref Range Status   Aerobic Bacterial Culture Final report  Final   Organism ID, Bacteria Routine flora  Final    Comment: Scant growth    Coagulation Studies: No results for input(s): "LABPROT", "INR" in the last 72 hours.   Urinalysis: No results for input(s): "COLORURINE", "LABSPEC", "PHURINE", "GLUCOSEU", "HGBUR", "BILIRUBINUR", "KETONESUR", "PROTEINUR", "UROBILINOGEN", "NITRITE", "LEUKOCYTESUR" in the last 72 hours.  Invalid input(s): "APPERANCEUR"     Imaging: No results found.   Medications:    sodium chloride     sodium chloride      apixaban  5 mg Oral BID   atorvastatin  40 mg Oral Daily   carvedilol  12.5 mg Oral BID WC   Chlorhexidine Gluconate Cloth  6 each Topical Q0600   clopidogrel  75 mg Oral Daily   insulin aspart  0-9 Units Subcutaneous TID WC   insulin detemir  20 Units Subcutaneous Daily   mouth rinse  15 mL Mouth Rinse 4 times per day   pantoprazole  40 mg Oral BID   PARoxetine  40 mg Oral Daily   senna-docusate  2 tablet Oral BID   sodium chloride flush  3 mL Intravenous Q12H   sodium chloride, acetaminophen, albuterol, diphenhydrAMINE, famotidine, HYDROmorphone (DILAUDID) injection, metoCLOPramide (REGLAN) injection, midazolam, ondansetron (ZOFRAN) IV, ondansetron (ZOFRAN) IV, mouth rinse, oxyCODONE, sodium chloride flush  Assessment/ Plan:  [name] is a 66 y.o.  female with past medical conditions including COPD, depression, diabetes, hypertension, stroke, and chronic kidney disease stage IV.  Patient presents to the hospital for scheduled procedure in vascular lab.  Patient was admitted for Atherosclerosis of native arteries of the extremities with ulceration (HCC) [I70.25]   Acute Kidney Injury on chronic kidney disease stage IV with baseline creatinine 2.5 and GFR of 20 on 07/30/21.  Acute kidney injury secondary to multiple factors including IV contrast and cardiorenal Chronic kidney disease is secondary to diabetes and hypertension Losartan currently held along with diuretics.  IV contrast given during angiogram.  Renal ultrasound ordered.  Patient has multiple factors at play resulting in current kidney failure.    Patient received dialysis yesterday, UF goal 1 L achieved.  Nursing staff to remove temporary catheter today.  Patient encouraged to work with therapy, mobilize.  We will continue to monitor renal function and determine recovery.  If recovery not achieved, patient will require  PermCath placement and possible outpatient clinic.   Lab Results  Component Value Date   CREATININE 2.27 (H) 08/19/2021   CREATININE 2.20 (H) 08/18/2021   CREATININE 2.74 (H) 08/17/2021    Intake/Output Summary (Last 24 hours) at 08/19/2021 1400 Last data filed at 08/19/2021 0523 Gross per 24 hour  Intake 180 ml  Output 400 ml  Net -220 ml    2. Anemia of chronic kidney disease Lab Results  Component Value Date   HGB 11.4 (L) 08/18/2021    Hemoglobin at goal. Continue to monitor  3. Secondary Hyperparathyroidism: with outpatient labs: PTH 187 on  07/30/21.   Lab Results  Component Value Date   CALCIUM 8.7 (L) 08/19/2021   CAION 1.08 (L) 01/29/2015   PHOS 3.1 08/19/2021     We will continue to monitor bone minerals during this admission.  4.  Hypertension with chronic kidney disease.  Blood pressure stable, 166/88   5. Diabetes mellitus type II with chronic kidney disease: insulin dependent. Home regimen includes NovoLog with an insulin pump. Most recent hemoglobin A1c is 7.9 on 07/14/21.    6. Hyperkalemia Potassium corrected with dialysis    LOS: 7   7/12/20232:00 PM

## 2021-08-20 DIAGNOSIS — Z992 Dependence on renal dialysis: Secondary | ICD-10-CM | POA: Diagnosis not present

## 2021-08-20 DIAGNOSIS — J9601 Acute respiratory failure with hypoxia: Secondary | ICD-10-CM | POA: Diagnosis not present

## 2021-08-20 DIAGNOSIS — I5033 Acute on chronic diastolic (congestive) heart failure: Secondary | ICD-10-CM | POA: Diagnosis not present

## 2021-08-20 DIAGNOSIS — N186 End stage renal disease: Secondary | ICD-10-CM | POA: Diagnosis not present

## 2021-08-20 LAB — GLUCOSE, CAPILLARY
Glucose-Capillary: 228 mg/dL — ABNORMAL HIGH (ref 70–99)
Glucose-Capillary: 298 mg/dL — ABNORMAL HIGH (ref 70–99)
Glucose-Capillary: 319 mg/dL — ABNORMAL HIGH (ref 70–99)
Glucose-Capillary: 312 mg/dL — ABNORMAL HIGH (ref 70–99)

## 2021-08-20 LAB — RENAL FUNCTION PANEL
Albumin: 3.1 g/dL — ABNORMAL LOW (ref 3.5–5.0)
Anion gap: 7 (ref 5–15)
BUN: 37 mg/dL — ABNORMAL HIGH (ref 8–23)
CO2: 27 mmol/L (ref 22–32)
Calcium: 8.9 mg/dL (ref 8.9–10.3)
Chloride: 103 mmol/L (ref 98–111)
Creatinine, Ser: 2.35 mg/dL — ABNORMAL HIGH (ref 0.44–1.00)
GFR, Estimated: 22 mL/min — ABNORMAL LOW (ref >60)
Glucose, Bld: 216 mg/dL — ABNORMAL HIGH (ref 70–99)
Phosphorus: 3.8 mg/dL (ref 2.5–4.6)
Potassium: 4.1 mmol/L (ref 3.5–5.1)
Sodium: 137 mmol/L (ref 135–145)

## 2021-08-20 LAB — CBC
HCT: 40.4 % (ref 36.0–46.0)
Hemoglobin: 11.8 g/dL — ABNORMAL LOW (ref 12.0–15.0)
MCH: 24.4 pg — ABNORMAL LOW (ref 26.0–34.0)
MCHC: 29.2 g/dL — ABNORMAL LOW (ref 30.0–36.0)
MCV: 83.5 fL (ref 80.0–100.0)
Platelets: 520 10*3/uL — ABNORMAL HIGH (ref 150–400)
RBC: 4.84 MIL/uL (ref 3.87–5.11)
RDW: 16.1 % — ABNORMAL HIGH (ref 11.5–15.5)
WBC: 12.8 10*3/uL — ABNORMAL HIGH (ref 4.0–10.5)
nRBC: 0 % (ref 0.0–0.2)

## 2021-08-20 LAB — HEMOGLOBIN: Hemoglobin: 11.7 g/dL — ABNORMAL LOW (ref 12.0–15.0)

## 2021-08-20 MED ORDER — INSULIN ASPART 100 UNIT/ML IJ SOLN
3.0000 [IU] | Freq: Three times a day (TID) | INTRAMUSCULAR | Status: DC
  Administered 2021-08-20 – 2021-08-21 (×3): 3 [IU] via SUBCUTANEOUS
  Filled 2021-08-20 (×3): qty 1

## 2021-08-20 MED ORDER — SENNOSIDES-DOCUSATE SODIUM 8.6-50 MG PO TABS
2.0000 | ORAL_TABLET | Freq: Every day | ORAL | Status: DC
  Filled 2021-08-20: qty 2

## 2021-08-20 NOTE — Progress Notes (Signed)
[facility]  ROUNDING NOTE   Subjective:   Jocelyn Wells is a 66 year old female with past medical conditions including COPD, depression, diabetes, hypertension, stroke, and chronic kidney disease stage IV.  Patient presents to the hospital for scheduled procedure in vascular lab.  Patient was admitted for Atherosclerosis of native arteries of the extremities with ulceration [facility]) [I70.25]  Patient is known to our practice and is followed by Dr.Korrapati outpatient.  Patient was last seen in office on 07/30/2021.   Patient seen sitting up in bed Alert and oriented Family at bedside Tolerating meals, appetite improving Does not like renal diet.  Requesting liberalizing diet.   Objective:  Vital signs in last 24 hours:  Temp:  [97.7 F (36.5 C)-98.8 F (37.1 C)] 98.2 F (36.8 C) (07/13 0822) Pulse Rate:  [74-84] 77 (07/13 0822) Resp:  [16-20] 17 (07/13 0822) BP: (126-188)/(69-88) 170/77 (07/13 0822) SpO2:  [92 %-96 %] 92 % (07/13 0822)  Weight change:  Filed Weights   08/18/21 0928 08/18/21 0934 08/18/21 1259  Weight: 101.3 kg 101.3 kg 100 kg    Intake/Output: I/O last 3 completed shifts: In: 180 [P.O.:180] Out: 600 [Urine:600]   Intake/Output this shift:  No intake/output data recorded.  Physical Exam: General: NAD  Head: Normocephalic, atraumatic.  Dry oral mucosal membranes  Eyes: Anicteric  Lungs:  Diminished breath sounds, [place] O2  Heart: Regular rate and rhythm  Abdomen:  Soft, nontender, obese  Extremities: trace peripheral edema.  Neurologic: Nonfocal, moving all four extremities  Skin: No lesions  Access:     Basic Metabolic Panel: Recent Labs  Lab 08/16/21 0432 08/17/21 0217 08/18/21 0338 08/19/21 0849 08/20/21 0417  NA 137 138 137 135 137  K 4.9 5.1 4.7 4.1 4.1  CL 102 103 102 102 103  CO2 '28 29 27 25 27  '$ GLUCOSE 199* 213* 224* 203* 216*  BUN 44* 48* 39* 32* 37*  CREATININE 2.80* 2.74* 2.20* 2.27* 2.35*  CALCIUM 8.4* 8.7* 8.6*  8.7* 8.9  MG 1.9  --   --   --   --   PHOS  --  3.8  --  3.1 3.8     Liver Function Tests: Recent Labs  Lab 08/17/21 0217 08/19/21 0849 08/20/21 0417  ALBUMIN 3.0* 3.0* 3.1*    No results for input(s): "LIPASE", "AMYLASE" in the last 168 hours. No results for input(s): "AMMONIA" in the last 168 hours.  CBC: Recent Labs  Lab 08/14/21 0552 08/14/21 1819 08/16/21 0432 08/16/21 0647 08/17/21 0217 08/18/21 0338 08/20/21 0417  WBC 14.4*  --  13.6*  --  13.3* 13.8* 12.8*  HGB 11.1*   < > 11.2* 11.0* 11.4* 11.4* 11.8*  HCT 38.4   < > 37.3 37.7 38.8 37.9 40.4  MCV 84.6  --  84.0  --  84.0 83.7 83.5  PLT 449*  --  438*  --  444* 516* 520*   < > = values in this interval not displayed.     Cardiac Enzymes: No results for input(s): "CKTOTAL", "CKMB", "CKMBINDEX", "TROPONINI" in the last 168 hours.  BNP: Invalid input(s): "POCBNP"  CBG: Recent Labs  Lab 08/19/21 0902 08/19/21 1211 08/19/21 1738 08/19/21 1951 08/20/21 0810  GLUCAP 209* 247* 264* 64* 93*     Microbiology: Results for orders placed or performed in visit on 07/29/21  Aerobic culture     Status: None   Collection Time: 07/29/21 10:45 AM  Result Value Ref Range Status   Aerobic Bacterial Culture Final  report  Final   Organism ID, Bacteria Routine flora  Final    Comment: Scant growth    Coagulation Studies: No results for input(s): "LABPROT", "INR" in the last 72 hours.   Urinalysis: No results for input(s): "COLORURINE", "LABSPEC", "PHURINE", "GLUCOSEU", "HGBUR", "BILIRUBINUR", "KETONESUR", "PROTEINUR", "UROBILINOGEN", "NITRITE", "LEUKOCYTESUR" in the last 72 hours.  Invalid input(s): "APPERANCEUR"    Imaging: No results found.   Medications:    sodium chloride     sodium chloride      apixaban  5 mg Oral BID   atorvastatin  40 mg Oral Daily   carvedilol  12.5 mg Oral BID WC   Chlorhexidine Gluconate Cloth  6 each Topical Q0600   clopidogrel  75 mg Oral Daily   insulin aspart   0-9 Units Subcutaneous TID WC   insulin aspart  3 Units Subcutaneous TID WC   insulin detemir  20 Units Subcutaneous Daily   mouth rinse  15 mL Mouth Rinse 4 times per day   pantoprazole  40 mg Oral BID   PARoxetine  40 mg Oral Daily   senna-docusate  2 tablet Oral BID   sodium chloride flush  3 mL Intravenous Q12H   sodium chloride, acetaminophen, albuterol, diphenhydrAMINE, famotidine, HYDROmorphone (DILAUDID) injection, metoCLOPramide (REGLAN) injection, midazolam, ondansetron (ZOFRAN) IV, ondansetron (ZOFRAN) IV, mouth rinse, oxyCODONE, sodium chloride flush  Assessment/ Plan:  [name] is a 66 y.o.  female with past medical conditions including COPD, depression, diabetes, hypertension, stroke, and chronic kidney disease stage IV.  Patient presents to the hospital for scheduled procedure in vascular lab.  Patient was admitted for Atherosclerosis of native arteries of the extremities with ulceration (HCC) [I70.25]   Acute Kidney Injury on chronic kidney disease stage IV with baseline creatinine 2.5 and GFR of 20 on 07/30/21.  Acute kidney injury secondary to multiple factors including IV contrast and cardiorenal Chronic kidney disease is secondary to diabetes and hypertension Losartan currently held along with diuretics.  IV contrast given during angiogram.  Renal ultrasound ordered.  Patient has multiple factors at play resulting in current kidney failure.    Patient received dialysis yesterday, UF goal 1 L achieved.  Patient encouraged to work with therapy, mobilize.  We will continue to monitor renal function and determine recovery.  If recovery not achieved, patient will require PermCath placement and possible outpatient clinic. Electrolytes and volume status are acceptable today.  No acute indication for dialysis.   Lab Results  Component Value Date   CREATININE 2.35 (H) 08/20/2021   CREATININE 2.27 (H) 08/19/2021   CREATININE 2.20 (H) 08/18/2021    Intake/Output  Summary (Last 24 hours) at 08/20/2021 1006 Last data filed at 08/19/2021 1900 Gross per 24 hour  Intake --  Output 600 ml  Net -600 ml    2. Anemia of chronic kidney disease Lab Results  Component Value Date   HGB 11.8 (L) 08/20/2021    Hemoglobin at goal. Continue to monitor  3. Secondary Hyperparathyroidism: with outpatient labs: PTH 187 on 07/30/21.   Lab Results  Component Value Date   CALCIUM 8.9 08/20/2021   CAION 1.08 (L) 01/29/2015   PHOS 3.8 08/20/2021     We will continue to monitor bone minerals during this admission.  4.  Diabetes mellitus type II with chronic kidney disease: insulin dependent. Home regimen includes NovoLog with an insulin pump. Most recent hemoglobin A1c is 7.9 on 07/14/21.      LOS: [place] 7/13/202310:06 AM

## 2021-08-20 NOTE — Progress Notes (Signed)
Inpatient Follow-up/Progress Note   Patient ID: Jocelyn Wells is a 66 y.o. female.  Overnight Events / Subjective Findings Called back to evaluate patient after 1 episode of blood with her stool. Nursing and family notes stool was brown but there was blood and mucus in the commode. Pt denies any abdominal pain, n/v. No hypotensive episodes. No further episodes of hematemesis or coffee ground emesis. No melena. Pt on 4LNC which is improvement. Catheter was pulled and no longer doing HD at this time. Eliquis dose and plavix dose this morning. Hgb has been stable and rising (11.8 today with 12 being her baseline). She is tolerating PO Sons and grandchildren at bedside. No other acute gi complaints.   Pt had another bowel movement during my evaluation which was mostly light brown with scant blood. Repeat hgb 11.7 which is unchanged from earlier today prior to episode.  Review of Systems  Constitutional:  Negative for unexpected weight change.  HENT:  Negative for trouble swallowing.   Respiratory:  Negative for shortness of breath and wheezing.   Cardiovascular:  Negative for chest pain.  Gastrointestinal:  Positive for blood in stool. Negative for abdominal distention, abdominal pain, constipation, diarrhea, nausea and vomiting.  Skin:  Negative for color change and pallor.    Medications  Current Facility-Administered Medications:    0.9 %  sodium chloride infusion, 250 mL, Intravenous, PRN, Schnier, Dolores Lory, MD   0.9 %  sodium chloride infusion, , Intravenous, Continuous, Eulogio Ditch E, NP   acetaminophen (TYLENOL) tablet 650 mg, 650 mg, Oral, Q4H PRN, Schnier, Dolores Lory, MD   albuterol (PROVENTIL) (2.5 MG/3ML) 0.083% nebulizer solution 2.5 mg, 2.5 mg, Nebulization, Q4H PRN, Schnier, Dolores Lory, MD   atorvastatin (LIPITOR) tablet 40 mg, 40 mg, Oral, Daily, Schnier, Dolores Lory, MD, 40 mg at 08/20/21 0847   carvedilol (COREG) tablet 12.5 mg, 12.5 mg, Oral, BID WC, Sharen Hones,  MD, 12.5 mg at 08/20/21 0847   Chlorhexidine Gluconate Cloth 2 % PADS 6 each, 6 each, Topical, Q0600, Colon Flattery, NP, 6 each at 08/20/21 5784   clopidogrel (PLAVIX) tablet 75 mg, 75 mg, Oral, Daily, Agbata, Tochukwu, MD, 75 mg at 08/20/21 0847   diphenhydrAMINE (BENADRYL) injection 50 mg, 50 mg, Intravenous, Once PRN, Kris Hartmann, NP   famotidine (PEPCID) tablet 40 mg, 40 mg, Oral, Once PRN, Eulogio Ditch E, NP   HYDROmorphone (DILAUDID) injection 1 mg, 1 mg, Intravenous, Once PRN, Eulogio Ditch E, NP   insulin aspart (novoLOG) injection 0-9 Units, 0-9 Units, Subcutaneous, TID WC, Sharen Hones, MD, 5 Units at 08/20/21 1257   insulin aspart (novoLOG) injection 3 Units, 3 Units, Subcutaneous, TID WC, Sharen Hones, MD, 3 Units at 08/20/21 1257   insulin detemir (LEVEMIR) injection 20 Units, 20 Units, Subcutaneous, Daily, Oswald Hillock, RPH, 20 Units at 08/20/21 0846   metoCLOPramide (REGLAN) injection 10 mg, 10 mg, Intravenous, Q6H PRN, Agbata, Tochukwu, MD   midazolam (VERSED) 2 MG/ML syrup 8 mg, 8 mg, Oral, Once PRN, Kris Hartmann, NP   ondansetron [facility]) injection 4 mg, 4 mg, Intravenous, Q6H PRN, Schnier, Dolores Lory, MD, 4 mg at 08/14/21 0555   ondansetron (ZOFRAN) injection 4 mg, 4 mg, Intravenous, Q6H PRN, Kris Hartmann, NP   Oral care mouth rinse, 15 mL, Mouth Rinse, 4 times per day, Schnier, Dolores Lory, MD, 15 mL at 08/20/21 1258   Oral care mouth rinse, 15 mL, Mouth Rinse, PRN, Schnier, Dolores Lory, MD   oxyCODONE (Oxy IR/ROXICODONE)  immediate release tablet 5-10 mg, 5-10 mg, Oral, Q4H PRN, Schnier, Dolores Lory, MD, 5 mg at 08/14/21 0249   pantoprazole (PROTONIX) EC tablet 40 mg, 40 mg, Oral, BID, Dorothe Pea, RPH, 40 mg at 08/20/21 0847   PARoxetine (PAXIL) tablet 40 mg, 40 mg, Oral, Daily, Schnier, Dolores Lory, MD, 40 mg at 08/20/21 0847   senna-docusate (Senokot-S) tablet 2 tablet, 2 tablet, Oral, BID, Sharen Hones, MD, 2 tablet at 08/20/21 0847   sodium chloride flush  (NS) 0.9 % injection 3 mL, 3 mL, Intravenous, Q12H, Schnier, Dolores Lory, MD, 3 mL at 08/20/21 0848   sodium chloride flush (NS) 0.9 % injection 3 mL, 3 mL, Intravenous, PRN, Schnier, Dolores Lory, MD  sodium chloride     sodium chloride      sodium chloride, acetaminophen, albuterol, diphenhydrAMINE, famotidine, HYDROmorphone (DILAUDID) injection, metoCLOPramide (REGLAN) injection, midazolam, ondansetron (ZOFRAN) IV, ondansetron (ZOFRAN) IV, mouth rinse, oxyCODONE, sodium chloride flush   Objective    Vitals:   08/19/21 2348 08/20/21 0332 08/20/21 0822 08/20/21 1202  BP: (!) 188/86 (!) 162/87 (!) 170/77 123/65  Pulse: 79 84 77 75  Resp: '18 20 17 16  '$ Temp: 98.8 F (37.1 C) 98.1 F (36.7 C) 98.2 F (36.8 C) 97.7 F (36.5 C)  TempSrc:   Oral Oral  SpO2: 95% 94% 92% 94%  Weight:      Height:         Physical Exam Vitals and nursing note reviewed.  Constitutional:      General: She is not in acute distress.    Appearance: She is obese. She is ill-appearing. She is not toxic-appearing or diaphoretic.  HENT:     Head: Normocephalic and atraumatic.     Nose: Nose normal.     Mouth/Throat:     Mouth: Mucous membranes are moist.     Pharynx: Oropharynx is clear.  Eyes:     General: No scleral icterus.    Extraocular Movements: Extraocular movements intact.  Cardiovascular:     Rate and Rhythm: Normal rate and regular rhythm.  Pulmonary:     Effort: No respiratory distress.     Breath sounds: No wheezing, rhonchi or rales.     Comments: Improved aeration bilaterally. On 4LNC Abdominal:     General: There is no distension.     Palpations: Abdomen is soft.     Tenderness: There is no abdominal tenderness. There is no guarding or rebound.  Genitourinary:    Comments: Light brown bm liquid with scant blood Musculoskeletal:     Cervical back: Neck supple.  Skin:    General: Skin is warm and dry.     Coloration: Skin is not jaundiced or pale.  Neurological:     General: No focal  deficit present.     Mental Status: She is alert and oriented to person, place, and time. Mental status is at baseline.  Psychiatric:        Mood and Affect: Mood normal.        Behavior: Behavior normal.        Thought Content: Thought content normal.        Judgment: Judgment normal.      Laboratory Data Recent Labs  Lab 08/17/21 0217 08/18/21 0338 08/20/21 0417  WBC 13.3* 13.8* 12.8*  HGB 11.4* 11.4* 11.8*  HCT 38.8 37.9 40.4  PLT 444* 516* 520*    Recent Labs  Lab 08/18/21 0338 08/19/21 0849 08/20/21 0417  NA 137 135 137  K 4.7  4.1 4.1  CL 102 102 103  CO2 '27 25 27  '$ BUN 39* 32* 37*  CREATININE 2.20* 2.27* 2.35*  CALCIUM 8.6* 8.7* 8.9  GLUCOSE 224* 203* 216*    Recent Labs  Lab 08/14/21 0552  INR 1.5*       Imaging Studies: No results found.  Assessment:   # BRBPR - csy 2018- IH - on eliquis and plavix each received morning of 08/20/21 - suspect hemorrhoidal; differential includes avm, diverticular, polyps/mass - repeat hgb 11.7 same as pre-episode hgb. - pt with BM while I was in room- light liquid brown-   # coffee ground emesis - resolved  # anemia-  improving - 11.1 on presentation from 12.8 at baseline. - back up to 11.8 (08/20/21) - no signs of gib outside of vomiting episode - ddx includes gastritis, ulcer, MWT, avm.   # constipation- resolved  # AKI on CKD- started on HD- now off and catheter has been pulled  # uremia- resolved with HD  # acute on chronic hypoxic respiratory failure- stable on 4LNC  # afib- on oac-  # atherosclerotic occlusive disease involving bilateral lower extremities and ulceration  left lower extremity s/p antioplasty, stent placement and revascularization  # h/o cva  # h/o dvt  # 2018- non bleeding gastric ulcer- h pylori negative  Plan:  Tolerating po Hgb increasing; repeat check after BRBPR episode pending Clinical status is improving. Vital signs stable. Hgb stable from earlier. Repeat bm with  scant blood- mostly light brown and liquid  Monitor h&H; transfusion and resuscitation as per primary team Protonix 40 mg by mouth twice daily for 8 weeks  Anti plt and anti coag as per primary team and vascular surgery Recommend continued bowel regimen- hold dose in setting of diarrhea  In discussion with vascular surgery given recent re-vascularization of when would be safe/appropriate time for antiplt/oac interruption to perform colonoscopy.  Awaiting answer back. Will have my office call to arrange outpatient appt and colonoscopy.  Given vss, no change in hgb-  colonoscopy can be performed on outpatient basis. This was d/w patient and family members at bedside and this is their preference  If bleeding continues and hemodynamically stable, can consider tagged rbc study. If hemodynamic instability occurs, recommend CTA  D/w hospitalist  Supportive care at this time including anti emetics as per primary team  GI available as needed.  I personally performed the service.  Management of other medical comorbidities as per primary team  Thank you for allowing [place] to participate in this patient's care. Please don't hesitate to call if any questions or concerns arise.   Annamaria Helling, DO [facility]  Portions of the record may have been created with voice recognition software. Occasional wrong-word or 'sound-a-like' substitutions may have occurred due to the inherent limitations of voice recognition software.  Read the chart carefully and recognize, using context, where substitutions may have occurred.

## 2021-08-20 NOTE — Progress Notes (Signed)
Physical Therapy Treatment Patient Details Name: Jocelyn Wells MRN: [id number] DOB: [birth year] Today's Date: 08/20/2021   History of Present Illness Jocelyn Wells is a 66 y.o. 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  Patient is status post peripheral angioplasty with successful recannulization of the left lower extremity for limb salvage. Patient also had a worsening renal function, developed volume overload, hyperkalemia, acute respiratory failure.  Temporary HD catheter was placed in the right femoral artery, dialyzed on 7/8.    PT Comments    OOB  with min guard. Steady in sitting.  Stands to RW with min a x 1 and is able to march in place then transfer to recliner with min guard.  She is taken to hallway and is able to stand and walk 68' with RW and min guard with sons assisting for O2 and recliner follow.  She does have some loose incontinent BM and is taken to bathroom to void and for cleaning.  She is able to walk from bathroom to chair with min guard and RW.  Discussed discharge with 2 sons in room.  She wishes to return home and son that lives here is able to assist.  Husband is home but cannot provide much physical assist.  They do wish for RW and BSC and will be installing handrails.     Recommendations for follow up therapy are one component of a multi-disciplinary discharge planning process, led by the attending physician.  Recommendations may be updated based on patient status,  additional functional criteria and insurance authorization.  Follow Up Recommendations  Home health PT     Assistance Recommended at Discharge Frequent or constant Supervision/Assistance  Patient can return home with the following A little help with walking and/or transfers;A little help with bathing/dressing/bathroom   Equipment Recommendations  Rolling walker (2 wheels);BSC/3in1    Recommendations for Other Services       Precautions / Restrictions Precautions Precautions: Fall Precaution Comments: catheter removed Restrictions Weight Bearing Restrictions: No     Mobility  Bed Mobility Overal bed mobility: Needs Assistance Bed Mobility: Supine to Sit     Supine to sit: Min guard, HOB elevated          Transfers Overall transfer level: Needs assistance Equipment used: Rolling walker (2 wheels) Transfers: Sit to/from Stand Sit to Stand: Min assist, Min guard                Ambulation/Gait Ambulation/Gait assistance: Min guard, Min assist Gait Distance (Feet): 50 Feet Assistive device: Rolling walker (2 wheels) Gait Pattern/deviations: Step-to pattern Gait velocity: decreased     General Gait Details: 21' then 20'   Stairs             Wheelchair Mobility    Modified Rankin (Stroke Patients Only)       Balance Overall balance assessment: Needs assistance Sitting-balance support: Bilateral upper extremity supported, Feet supported Sitting balance-Leahy Scale: Good     Standing balance support: Bilateral upper extremity supported, Reliant on assistive device for balance Standing balance-Leahy Scale: Fair Standing balance comment: pt needing  constant UE support for balance.                            Cognition Arousal/Alertness: Awake/alert Behavior During Therapy: WFL for tasks assessed/performed Overall Cognitive Status: Within Functional Limits for tasks assessed                                           Exercises Other Exercises Other Exercises: to bathroom to void/BM    General Comments        Pertinent Vitals/Pain Pain Assessment Pain Assessment: No/denies pain    Home Living                          Prior Function            PT Goals (current goals can now be found in the care plan section) Progress towards PT goals: Progressing toward goals    Frequency    Min 2X/week      PT Plan Discharge plan needs to be updated    Co-evaluation              AM-PAC PT "6 Clicks" Mobility   Outcome Measure  Help needed turning from your back to your side while in a flat bed without using bedrails?: None Help needed moving from lying on your back to sitting on the side of a flat bed without using bedrails?: A Little Help needed moving to and from a bed to a chair (including a wheelchair)?: A Little Help needed standing up from a chair using your arms (e.g., wheelchair or bedside chair)?: A Little Help needed to walk in hospital room?: A Little Help needed climbing 3-5 steps with a railing? : A Lot 6 Click Score: 18    End of Session Equipment Utilized During Treatment: Gait belt;Oxygen Activity Tolerance: Patient tolerated treatment well Patient left: in chair;with call bell/phone within reach;with chair alarm set;with family/visitor present Nurse Communication: Mobility status PT Visit Diagnosis: Unsteadiness on feet (R26.81);Other abnormalities of gait and mobility (R26.89);Repeated falls (R29.6);Muscle weakness (generalized) (M62.81);History of falling (Z91.81);Difficulty in walking, not elsewhere classified (R26.2)     Time: 3329-5188 PT Time Calculation (min) (ACUTE ONLY): 34 min  Charges:  $Gait Training: 23-37 mins                    Chesley Noon, PTA 08/20/21, 12:38 PM

## 2021-08-20 NOTE — Progress Notes (Addendum)
  Progress Note   Patient: Jocelyn Wells DOB: [birth year] DOA: 08/12/2021     8 DOS: the patient was seen and examined on 08/20/2021   Brief hospital course: Jocelyn Wells is a 66 y.o. 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 Patient is status post peripheral angioplasty with successful recannulization of the left lower extremity for limb salvage.  Patient also had a worsening renal function, developed volume overload, hyperkalemia, acute respiratory failure. Temporary HD catheter was placed in the right femoral artery, dialyzed on 7/8. 7/12.  Volume status much better, temporary dialysis catheter removed.  Assessment and Plan: Acute respiratory failure with hypoxemia secondary to volume overload. End-stage renal disease with volume overload. Hyperkalemia Acute on chronic diastolic congestive heart failure. Patient still makes significant amount of urine, she may not have end-stage renal disease, she may have chronic kidney disease stage 4 with acute kidney injury.  We will make a final determination before discharge. Condition continues to improve, weaned down to 2 L oxygen. Renal function slightly worse today, will continue monitor.  No need for dialysis today.   Peripheral arterial disease status post intervention. Continue atorvastatin.   Coffee-ground emesis. Continue Protonix orally.   Type 2 diabetes. Continue Levemir and sliding scale insulin, add a scheduled  NovoLog.  Glucose running high   Addendum: 1500 Rectal bleeding. Patient had fresh red blood per rectum today, will notify GI.  I will hold anticoagulation for now.     Subjective:  Patient doing better, no significant short of breath today.  Physical Exam: Vitals:   08/19/21 2348 08/20/21 0332 08/20/21 0822 08/20/21 1202  BP: (!) 188/86 (!) 162/87 (!) 170/77 123/65  Pulse: 79 84 77 75  Resp: '18 20 17 16  '$ Temp: 98.8 F (37.1 C) 98.1 F (36.7 C) 98.2 F (36.8 C) 97.7 F (36.5 C)  TempSrc:   Oral Oral  SpO2: 95% 94% 92% 94%  Weight:      Height:       General exam: Appears calm and comfortable  Respiratory system: Clear to auscultation. Respiratory effort normal. Cardiovascular system: S1 & S2 heard, RRR. No JVD, murmurs, rubs, gallops or clicks. No pedal edema. Gastrointestinal system: Abdomen is nondistended, soft and nontender. No organomegaly or masses felt. Normal bowel sounds heard. Central nervous system: Alert and oriented. No focal neurological deficits. Extremities: Symmetric 5 x 5 power. Skin: No rashes, lesions or ulcers Psychiatry: Judgement and insight appear normal. Mood & affect appropriate.   Data Reviewed:  Lab results reviewed.  Family Communication: Son updated at bedside  Disposition: Status is: Inpatient Remains inpatient appropriate because: Severity of disease, need close monitoring of renal function before discharge  Planned Discharge Destination: Home with Home Health    Time spent: 35 minutes  Author: Sharen Hones, MD 08/20/2021 1:23 PM  For on call review [email or web address].

## 2021-08-20 NOTE — Inpatient Diabetes Management (Signed)
Inpatient Diabetes Program Recommendations  AACE/ADA: New Consensus Statement on Inpatient Glycemic Control (2015)  Target Ranges:  Prepandial:   less than 140 mg/dL      Peak postprandial:   less than 180 mg/dL (1-2 hours)      Critically ill patients:  140 - 180 mg/dL    Latest Reference Range & Units 08/19/21 09:02 08/19/21 12:11 08/19/21 17:38 08/19/21 19:51  Glucose-Capillary 70 - 99 mg/dL 209 (H)  3 units Novolog  20 units Levemir  247 (H)  3 units Novolog  264 (H)  5 units Novolog  308 (H)  (H): Data is abnormally high  Latest Reference Range & Units 08/20/21 08:10  Glucose-Capillary 70 - 99 mg/dL 228 (H)  3 units Novolog  20 units Levemir  (H): Data is abnormally high    Home DM Meds: Omnipod insulin pump with Novolog      FreeStyle Libre CGM      Trulicity 3 mg Qweek  Current Orders: Levemir 20 units daily   Novolog 0-9 units TID with meals   Novolog 3 units TID with meals    MD- Note Novolog 3 units TID with meals (meal coverage) to start at 12pm today with Lunch  AM CBGs remain >200  Please consider also increasing the Levemir slightly to 22 units Daily  Since 20 unit dose already given this AM, please also order Levemir 2 units X 1 dose to be given today as well    --Will follow patient during hospitalization--  Wyn Quaker RN, MSN, CDE Diabetes Coordinator Inpatient Glycemic Control Team Team Pager: [phone] (8a-5p)

## 2021-08-21 ENCOUNTER — Ambulatory Visit: Payer: Medicare Other | Admitting: Cardiology

## 2021-08-21 DIAGNOSIS — J9601 Acute respiratory failure with hypoxia: Secondary | ICD-10-CM | POA: Diagnosis not present

## 2021-08-21 DIAGNOSIS — I5033 Acute on chronic diastolic (congestive) heart failure: Secondary | ICD-10-CM | POA: Diagnosis not present

## 2021-08-21 DIAGNOSIS — N186 End stage renal disease: Secondary | ICD-10-CM | POA: Diagnosis not present

## 2021-08-21 DIAGNOSIS — Z992 Dependence on renal dialysis: Secondary | ICD-10-CM | POA: Diagnosis not present

## 2021-08-21 LAB — BASIC METABOLIC PANEL
Anion gap: 10 (ref 5–15)
BUN: 43 mg/dL — ABNORMAL HIGH (ref 8–23)
CO2: 25 mmol/L (ref 22–32)
Calcium: 8.8 mg/dL — ABNORMAL LOW (ref 8.9–10.3)
Chloride: 102 mmol/L (ref 98–111)
Creatinine, Ser: 2.63 mg/dL — ABNORMAL HIGH (ref 0.44–1.00)
GFR, Estimated: 19 mL/min — ABNORMAL LOW (ref >60)
Glucose, Bld: 242 mg/dL — ABNORMAL HIGH (ref 70–99)
Potassium: 4.1 mmol/L (ref 3.5–5.1)
Sodium: 137 mmol/L (ref 135–145)

## 2021-08-21 LAB — CBC
HCT: 37.5 % (ref 36.0–46.0)
Hemoglobin: 11.2 g/dL — ABNORMAL LOW (ref 12.0–15.0)
MCH: 24.6 pg — ABNORMAL LOW (ref 26.0–34.0)
MCHC: 29.9 g/dL — ABNORMAL LOW (ref 30.0–36.0)
MCV: 82.4 fL (ref 80.0–100.0)
Platelets: 432 10*3/uL — ABNORMAL HIGH (ref 150–400)
RBC: 4.55 MIL/uL (ref 3.87–5.11)
RDW: 16.3 % — ABNORMAL HIGH (ref 11.5–15.5)
WBC: 13.4 10*3/uL — ABNORMAL HIGH (ref 4.0–10.5)
nRBC: 0 % (ref 0.0–0.2)

## 2021-08-21 LAB — GLUCOSE, CAPILLARY
Glucose-Capillary: 237 mg/dL — ABNORMAL HIGH (ref 70–99)
Glucose-Capillary: 241 mg/dL — ABNORMAL HIGH (ref 70–99)
Glucose-Capillary: 290 mg/dL — ABNORMAL HIGH (ref 70–99)
Glucose-Capillary: 306 mg/dL — ABNORMAL HIGH (ref 70–99)
Glucose-Capillary: 309 mg/dL — ABNORMAL HIGH (ref 70–99)
Glucose-Capillary: 376 mg/dL — ABNORMAL HIGH (ref 70–99)

## 2021-08-21 LAB — MAGNESIUM: Magnesium: 1.7 mg/dL (ref 1.7–2.4)

## 2021-08-21 MED ORDER — INSULIN ASPART 100 UNIT/ML IJ SOLN
6.0000 [IU] | Freq: Three times a day (TID) | INTRAMUSCULAR | Status: DC
  Administered 2021-08-21 – 2021-08-23 (×6): 6 [IU] via SUBCUTANEOUS
  Filled 2021-08-21 (×6): qty 1

## 2021-08-21 MED ORDER — INSULIN DETEMIR 100 UNIT/ML ~~LOC~~ SOLN
24.0000 [IU] | Freq: Every day | SUBCUTANEOUS | Status: DC
  Administered 2021-08-22 – 2021-08-23 (×2): 24 [IU] via SUBCUTANEOUS
  Filled 2021-08-21 (×2): qty 0.24

## 2021-08-21 NOTE — Inpatient Diabetes Management (Signed)
Inpatient Diabetes Program Recommendations  AACE/ADA: New Consensus Statement on Inpatient Glycemic Control   Target Ranges:  Prepandial:   less than 140 mg/dL      Peak postprandial:   less than 180 mg/dL (1-2 hours)      Critically ill patients:  140 - 180 mg/dL    Latest Reference Range & Units 08/21/21 00:02 08/21/21 04:31 08/21/21 07:24  Glucose-Capillary 70 - 99 mg/dL 309 (H) 241 (H) 237 (H)    Latest Reference Range & Units 08/20/21 08:10 08/20/21 12:04 08/20/21 16:31 08/20/21 20:13  Glucose-Capillary 70 - 99 mg/dL 228 (H) 298 (H) 319 (H) 312 (H)   Review of Glycemic Control  Diabetes history: DM2 Outpatient Diabetes medications: Omnipod insulin pump with Novolog, FreeStyle Libre CGM, Trulicity 3 mg Qweek Current orders for Inpatient glycemic control: Levemir 20 units daily, Novolog 0-9 units TID with meals, Novolog 3 units TID with meals   Inpatient Diabetes Program Recommendations:     Insulin: Please consider increasing Levemir to 23 units daily, adding Novolog 0-5 units QHS, and increasing meal coverage to Novolog 6 units TID with meals if patient eats at least 50% of meals.  Thanks, Barnie Alderman, RN, MSN, [place] Diabetes Coordinator Inpatient Diabetes Program [phone] (Team Pager from 8am to [place])

## 2021-08-21 NOTE — Care Management Important Message (Signed)
Important Message  Patient Details  Name: Jocelyn Wells MRN: [id number] Date of Birth: October 27, 1955   Medicare Important Message Given:  Yes     Dannette Barbara 08/21/2021, 12:57 PM

## 2021-08-21 NOTE — Progress Notes (Signed)
[facility]  ROUNDING NOTE   Subjective:   Jocelyn Wells is a 66 year old female with past medical conditions including COPD, depression, diabetes, hypertension, stroke, and chronic kidney disease stage IV.  Patient presents to the hospital for scheduled procedure in vascular lab.  Patient was admitted for Atherosclerosis of native arteries of the extremities with ulceration [facility]) [I70.25]  Patient is known to our practice and is followed by Dr.Korrapati outpatient.  Patient was last seen in office on 07/30/2021.   Patient seen sitting up in bed, later walked in the hallway with assistance from physical therapy. Alert and oriented-asking to go home to see her dog. Family at bedside Tolerating meals, appetite improving Unfortunately, serum creatinine is higher today at 2.6.   Objective:  Vital signs in last 24 hours:  Temp:  [97.7 F (36.5 C)-98.6 F (37 C)] 98.2 F (36.8 C) (07/14 0807) Pulse Rate:  [75-78] 78 (07/14 0807) Resp:  [16-19] 19 (07/14 0432) BP: (123-162)/(65-81) 162/79 (07/14 0807) SpO2:  [90 %-99 %] 99 % (07/14 0807)  Weight change:  Filed Weights   08/18/21 0928 08/18/21 0934 08/18/21 1259  Weight: 101.3 kg 101.3 kg 100 kg    Intake/Output: I/O last 3 completed shifts: In: 2 [P.O.:960] Out: -    Intake/Output this shift:  Total I/O In: 240 [P.O.:240] Out: -   Physical Exam: General: NAD  Head: Normocephalic, atraumatic.  Dry oral mucosal membranes  Eyes: Anicteric  Lungs:  Diminished breath sounds, [place] O2, mild basilar crackles  Heart: Regular rate and rhythm  Abdomen:  Soft, nontender, obese  Extremities: trace peripheral edema.  Neurologic: Nonfocal, moving all four extremities  Skin: No lesions  Access:     Basic Metabolic Panel: Recent Labs  Lab 08/16/21 0432 08/17/21 0217 08/18/21 0338 08/19/21 0849 08/20/21 0417 08/21/21 0426  NA 137 138 137 135 137 137  K 4.9 5.1 4.7 4.1 4.1 4.1  CL 102 103 102 102 103 102  CO2  '28 29 27 25 27 25  '$ GLUCOSE 199* 213* 224* 203* 216* 242*  BUN 44* 48* 39* 32* 37* 43*  CREATININE 2.80* 2.74* 2.20* 2.27* 2.35* 2.63*  CALCIUM 8.4* 8.7* 8.6* 8.7* 8.9 8.8*  MG 1.9  --   --   --   --  1.7  PHOS  --  3.8  --  3.1 3.8  --      Liver Function Tests: Recent Labs  Lab 08/17/21 0217 08/19/21 0849 08/20/21 0417  ALBUMIN 3.0* 3.0* 3.1*    No results for input(s): "LIPASE", "AMYLASE" in the last 168 hours. No results for input(s): "AMMONIA" in the last 168 hours.  CBC: Recent Labs  Lab 08/16/21 0432 08/16/21 0647 08/17/21 0217 08/18/21 0338 08/20/21 0417 08/20/21 1626 08/21/21 0426  WBC 13.6*  --  13.3* 13.8* 12.8*  --  13.4*  HGB 11.2* 11.0* 11.4* 11.4* 11.8* 11.7* 11.2*  HCT 37.3 37.7 38.8 37.9 40.4  --  37.5  MCV 84.0  --  84.0 83.7 83.5  --  82.4  PLT 438*  --  444* 516* 520*  --  432*     Cardiac Enzymes: No results for input(s): "CKTOTAL", "CKMB", "CKMBINDEX", "TROPONINI" in the last 168 hours.  BNP: Invalid input(s): "POCBNP"  CBG: Recent Labs  Lab 08/20/21 1631 08/20/21 2013 08/21/21 0002 08/21/21 0431 08/21/21 0724  GLUCAP 319* 312* 309* 241* 237*     Microbiology: Results for orders placed or performed in visit on 07/29/21  Aerobic culture  Status: None   Collection Time: 07/29/21 10:45 AM  Result Value Ref Range Status   Aerobic Bacterial Culture Final report  Final   Organism ID, Bacteria Routine flora  Final    Comment: Scant growth    Coagulation Studies: No results for input(s): "LABPROT", "INR" in the last 72 hours.   Urinalysis: No results for input(s): "COLORURINE", "LABSPEC", "PHURINE", "GLUCOSEU", "HGBUR", "BILIRUBINUR", "KETONESUR", "PROTEINUR", "UROBILINOGEN", "NITRITE", "LEUKOCYTESUR" in the last 72 hours.  Invalid input(s): "APPERANCEUR"    Imaging: No results found.   Medications:    sodium chloride     sodium chloride      atorvastatin  40 mg Oral Daily   carvedilol  12.5 mg Oral BID WC    Chlorhexidine Gluconate Cloth  6 each Topical Q0600   clopidogrel  75 mg Oral Daily   insulin aspart  0-9 Units Subcutaneous TID WC   insulin aspart  3 Units Subcutaneous TID WC   insulin detemir  20 Units Subcutaneous Daily   mouth rinse  15 mL Mouth Rinse 4 times per day   pantoprazole  40 mg Oral BID   PARoxetine  40 mg Oral Daily   senna-docusate  2 tablet Oral QHS   sodium chloride flush  3 mL Intravenous Q12H   sodium chloride, acetaminophen, albuterol, diphenhydrAMINE, famotidine, HYDROmorphone (DILAUDID) injection, metoCLOPramide (REGLAN) injection, midazolam, ondansetron (ZOFRAN) IV, ondansetron (ZOFRAN) IV, mouth rinse, oxyCODONE, sodium chloride flush  Assessment/ Plan:  [name] is a 66 y.o.  female with past medical conditions including COPD, depression, diabetes, hypertension, stroke, and chronic kidney disease stage IV.  Patient presents to the hospital for scheduled procedure in vascular lab.  Patient was admitted for Atherosclerosis of native arteries of the extremities with ulceration (HCC) [I70.25]   Acute Kidney Injury on chronic kidney disease stage IV with baseline creatinine 2.5 and GFR of 20 on 07/30/21.  Acute kidney injury secondary to multiple factors including IV contrast and cardiorenal Chronic kidney disease is secondary to diabetes and hypertension Losartan currently held along with diuretics.  IV contrast given during angiogram.  Renal ultrasound-echogenic kidneys.  No hydronephrosis.  Patient has multiple factors at play resulting in current kidney failure.    We will continue to monitor renal function and determine recovery.  If recovery not achieved, patient will require PermCath placement and possible outpatient clinic. Electrolytes and volume status are acceptable today.  No acute indication for dialysis.  However, increasing serum creatinine trend is noted.   Lab Results  Component Value Date   CREATININE 2.63 (H) 08/21/2021   CREATININE 2.35  (H) 08/20/2021   CREATININE 2.27 (H) 08/19/2021    Intake/Output Summary (Last 24 hours) at 08/21/2021 1147 Last data filed at 08/21/2021 0900 Gross per 24 hour  Intake 720 ml  Output --  Net 720 ml    2. Anemia of chronic kidney disease Lab Results  Component Value Date   HGB 11.2 (L) 08/21/2021    Hemoglobin at goal. Continue to monitor  3. Secondary Hyperparathyroidism: with outpatient labs: PTH 187 on 07/30/21.   Lab Results  Component Value Date   CALCIUM 8.8 (L) 08/21/2021   CAION 1.08 (L) 01/29/2015   PHOS 3.8 08/20/2021     We will continue to monitor bone minerals during this admission.  4.  Diabetes mellitus type II with chronic kidney disease: insulin dependent. Home regimen includes NovoLog with an insulin pump. Most recent hemoglobin A1c is 7.9 on 07/14/21.      LOS: 9  Hennessy Bartel Candiss Norse 7/14/202311:47 AM

## 2021-08-21 NOTE — Progress Notes (Signed)
Progress Note   Patient: Jocelyn Wells OIZ:[id number] DOB: Dec 10, 1955 DOA: 08/12/2021     9 DOS: the patient was seen and examined on 08/21/2021   Brief hospital course: YARETZI ERNANDEZ is a 66 y.o. 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 Patient is status post peripheral angioplasty with successful recannulization of the left lower extremity for limb salvage.  Patient also had a worsening renal function, developed volume overload, hyperkalemia, acute respiratory failure. Temporary HD catheter was placed in the right femoral artery, dialyzed on 7/8. 7/12.  Volume status much better, temporary dialysis catheter removed.  Assessment and Plan: Acute respiratory failure with hypoxemia secondary to volume overload. End-stage renal disease with volume overload. Hyperkalemia Acute on chronic diastolic congestive heart failure. Patient still makes significant amount of urine, she may not have end-stage renal disease, she may have chronic kidney disease stage 4 with acute kidney injury.  We will make a final determination before discharge. Patient still requiring 4 L oxygen overnight, does not feel short of breath.  Renal function is getting worse.  Discussed with Dr. Candiss Norse, patient may need a permacath.  But we will continue to monitor over the weekend, can give Lasix if volume status is worse.  Will consider permacath if renal function does not get him better.   Peripheral arterial disease status  post intervention. Continue atorvastatin.   Coffee-ground emesis. Rectal bleeding. Patient is on Protonix, patient also had episode of rectal bleeding yesterday.  Anticoagulation was discontinued. Patient is seen by GI again, looks like rectal bleeding is slowing down.  Not planning for any work-up at this time.   Type 2 diabetes. Increase dose of Levemir and scheduled NovoLog.   History of DVT. History of CVA  Coagulation on hold for today, may consider restart tomorrow if no additional bleeding and hemoglobin stable.        Subjective:  Patient had episode of rectal bleeding yesterday, but better afterwards.  No nausea vomiting. Try to increase walking requirement overnight, but does not feel short of breath  Physical Exam: Vitals:   08/20/21 2012 08/21/21 0000 08/21/21 0432 08/21/21 0807  BP: (!) 145/81 (!) 159/66 (!) 155/67 (!) 162/79  Pulse: 75 78 78 78  Resp: '18 18 19   '$ Temp: 97.7 F (36.5 C) 98.6 F (37 C) 98.2 F (36.8 C) 98.2 F (36.8 C)  TempSrc:  Oral    SpO2: 90% 92% 94% 99%  Weight:      Height:       General exam: Appears calm and comfortable  Respiratory system: Decreased breathing sounds. Respiratory effort normal. Cardiovascular system: S1 & S2 heard, RRR. No JVD, murmurs, rubs, gallops or clicks. No pedal edema. Gastrointestinal system: Abdomen is nondistended, soft and nontender. No organomegaly or masses felt. Normal bowel sounds heard. Central nervous system: Alert and oriented. No focal neurological deficits. Extremities: Symmetric 5 x 5 power. Skin: No rashes, lesions or ulcers Psychiatry: Judgement and insight appear normal. Mood & affect appropriate.   Data Reviewed:  Lab results reviewed.  Family Communication: Son updated at bedside.  Disposition:  Status is: Inpatient Remains inpatient appropriate because: Severity of disease.  Planned Discharge Destination: Home with Home Health    Time spent: 35 minutes  Author: Sharen Hones, MD 08/21/2021 12:22 PM  For on call review [email or web address].

## 2021-08-21 NOTE — Progress Notes (Signed)
Physical Therapy Treatment Patient Details Name: Jocelyn Wells MRN: [id number] DOB: 1955/06/24 Today's Date: 08/21/2021   History of Present Illness Jocelyn Wells is a 65 y.o. 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  Patient is status post peripheral angioplasty with successful recannulization of the left lower extremity for limb salvage. Patient also had a worsening renal function, developed volume overload, hyperkalemia, acute respiratory failure.  Temporary HD catheter was placed in the right femoral artery, dialyzed on 7/8.    PT Comments    Pt seen for PT tx with son Legrand Como) present for session. Pt appearing down, reporting "I'm depressed" as she wishes to go home; PT & son provide encouragement throughout session. Pt is able to complete bed mobility with supervision & hospital bed features, STS with CGA & ambulates increased gait distances with RW & CGA. Pt negotiates 2 steps with B rails to simulate home set up & pt requires min assist with pt becoming anxious with task. Will continue to follow pt acutely to address balance, endurance, gait with LRAD, and stair negotiation.     Recommendations for follow up therapy are one component of a multi-disciplinary discharge planning process, led by the attending physician.  Recommendations may be updated based on patient status, additional functional criteria and insurance authorization.  Follow Up Recommendations  Home health PT Can patient  physically be transported by private vehicle: Yes   Assistance Recommended at Discharge Frequent or constant Supervision/Assistance  Patient can return home with the following A little help with walking and/or transfers;A little help with bathing/dressing/bathroom;Assistance with cooking/housework;Assist for transportation;Help with stairs or ramp for entrance   Equipment Recommendations  Rolling walker (2 wheels);BSC/3in1    Recommendations for Other Services       Precautions / Restrictions Precautions Precautions: Fall Restrictions Weight Bearing Restrictions: No     Mobility  Bed Mobility Overal bed mobility: Needs Assistance Bed Mobility: Supine to Sit     Supine to sit: Supervision, HOB elevated (extra time)          Transfers Overall transfer level: Needs assistance Equipment used: Rolling walker (2 wheels) Transfers: Sit to/from Stand Sit to Stand: Min guard, Supervision           General transfer comment: uses momentum, able to stand on 3rd rock, min cuing for safe hand placement to push to standing    Ambulation/Gait Ambulation/Gait assistance: Min guard Gait Distance (Feet): 125 Feet Assistive device: Rolling walker (2 wheels) Gait Pattern/deviations: Decreased step length - right, Decreased step length - left, Decreased stride length Gait velocity: decreased     General Gait Details: pushes RW slightly out in front of her   Stairs Stairs: Yes Stairs assistance: Min assist Stair Management: Two rails, Step to pattern Number of Stairs: 2 General stair comments: pt becomes anxious with stair negotiation but does well with min assist   Wheelchair Mobility    Modified Rankin (Stroke Patients Only)       Balance Overall balance assessment: Needs assistance Sitting-balance support: Bilateral upper extremity supported, Feet supported Sitting balance-Leahy Scale: Good  Standing balance support: During functional activity, Bilateral  upper extremity supported Standing balance-Leahy Scale: Fair                              Cognition Arousal/Alertness: Awake/alert Behavior During Therapy: Flat affect Overall Cognitive Status: Within Functional Limits for tasks assessed                                 General Comments: Pt appears down & reports "I'm depressed" as pt wishes to go home soon, PT provides encouragement throughout. Son Legrand Como) present throughout session & assisting PRN.        Exercises      General Comments General comments (skin integrity, edema, etc.): Pt on 3L/min via nasal cannula with SpO2 >90%      Pertinent Vitals/Pain Pain Assessment Pain Assessment: No/denies pain    Home Living                          Prior Function            PT Goals (current goals can now be found in the care plan section) Acute Rehab PT Goals Patient Stated Goal: to get stronger. PT Goal Formulation: With patient Time For Goal Achievement: 09/01/21 Potential to Achieve Goals: Good Progress towards PT goals: Progressing toward goals    Frequency    Min 2X/week      PT Plan Current plan remains appropriate    Co-evaluation              AM-PAC PT "6 Clicks" Mobility   Outcome Measure  Help needed turning from your back to your side while in a flat bed without using bedrails?: None Help needed moving from lying on your back to sitting on the side of a flat bed without using bedrails?: A Little Help needed moving to and from a bed to a chair (including a wheelchair)?: A Little Help needed standing up from a chair using your arms (e.g., wheelchair or bedside chair)?: A Little Help needed to walk in hospital room?: A Little Help needed climbing 3-5 steps with a railing? : A Little 6 Click Score: 19    End of Session Equipment Utilized During Treatment: Oxygen Activity Tolerance: Patient tolerated treatment well Patient left: with chair alarm set;with  call bell/phone within reach;with family/visitor present   PT Visit Diagnosis: Unsteadiness on feet (R26.81);Other abnormalities of gait and mobility (R26.89);Repeated falls (R29.6);Muscle weakness (generalized) (M62.81);History of falling (Z91.81);Difficulty in walking, not elsewhere classified (R26.2)     Time: 2409-7353 PT Time Calculation (min) (ACUTE ONLY): 28 min  Charges:  $Therapeutic Activity: 23-37 mins                     Lavone Nian, PT, DPT 08/21/21, 1:41 PM   Waunita Schooner 08/21/2021, 1:39 PM

## 2021-08-22 DIAGNOSIS — I5033 Acute on chronic diastolic (congestive) heart failure: Secondary | ICD-10-CM | POA: Diagnosis not present

## 2021-08-22 DIAGNOSIS — N170 Acute kidney failure with tubular necrosis: Secondary | ICD-10-CM | POA: Diagnosis not present

## 2021-08-22 DIAGNOSIS — N184 Chronic kidney disease, stage 4 (severe): Secondary | ICD-10-CM | POA: Diagnosis not present

## 2021-08-22 DIAGNOSIS — J9601 Acute respiratory failure with hypoxia: Secondary | ICD-10-CM | POA: Diagnosis not present

## 2021-08-22 LAB — BASIC METABOLIC PANEL
Anion gap: 7 (ref 5–15)
BUN: 53 mg/dL — ABNORMAL HIGH (ref 8–23)
CO2: 24 mmol/L (ref 22–32)
Calcium: 8.9 mg/dL (ref 8.9–10.3)
Chloride: 105 mmol/L (ref 98–111)
Creatinine, Ser: 2.47 mg/dL — ABNORMAL HIGH (ref 0.44–1.00)
GFR, Estimated: 21 mL/min — ABNORMAL LOW (ref >60)
Glucose, Bld: 230 mg/dL — ABNORMAL HIGH (ref 70–99)
Potassium: 4 mmol/L (ref 3.5–5.1)
Sodium: 136 mmol/L (ref 135–145)

## 2021-08-22 LAB — CBC
HCT: 38.1 % (ref 36.0–46.0)
Hemoglobin: 11.3 g/dL — ABNORMAL LOW (ref 12.0–15.0)
MCH: 24.5 pg — ABNORMAL LOW (ref 26.0–34.0)
MCHC: 29.7 g/dL — ABNORMAL LOW (ref 30.0–36.0)
MCV: 82.5 fL (ref 80.0–100.0)
Platelets: 494 10*3/uL — ABNORMAL HIGH (ref 150–400)
RBC: 4.62 MIL/uL (ref 3.87–5.11)
RDW: 16.4 % — ABNORMAL HIGH (ref 11.5–15.5)
WBC: 13.5 10*3/uL — ABNORMAL HIGH (ref 4.0–10.5)
nRBC: 0 % (ref 0.0–0.2)

## 2021-08-22 LAB — GLUCOSE, CAPILLARY
Glucose-Capillary: 233 mg/dL — ABNORMAL HIGH (ref 70–99)
Glucose-Capillary: 391 mg/dL — ABNORMAL HIGH (ref 70–99)
Glucose-Capillary: 311 mg/dL — ABNORMAL HIGH (ref 70–99)
Glucose-Capillary: 329 mg/dL — ABNORMAL HIGH (ref 70–99)

## 2021-08-22 MED ORDER — APIXABAN 5 MG PO TABS
5.0000 mg | ORAL_TABLET | Freq: Two times a day (BID) | ORAL | Status: DC
  Administered 2021-08-22 – 2021-08-23 (×3): 5 mg via ORAL
  Filled 2021-08-22 (×3): qty 1

## 2021-08-22 MED ORDER — TORSEMIDE 20 MG PO TABS
20.0000 mg | ORAL_TABLET | Freq: Every day | ORAL | Status: DC
  Administered 2021-08-22 – 2021-08-23 (×2): 20 mg via ORAL
  Filled 2021-08-22 (×2): qty 1

## 2021-08-22 NOTE — Progress Notes (Signed)
[facility]  ROUNDING NOTE   Subjective:   Jocelyn Wells is a 66 year old female with past medical conditions including COPD, depression, diabetes, hypertension, stroke, and chronic kidney disease stage IV.  Patient presents to the hospital for scheduled procedure in vascular lab.  Patient was admitted for Atherosclerosis of native arteries of the extremities with ulceration [facility]) [I70.25]  Patient is known to our practice and is followed by Dr.Korrapati outpatient.  Patient was last seen in office on 07/30/2021.   Patient seen sitting up in bed, multiple family members at bedside. Yesterday she walked in the hallway with assistance from physical therapy. Alert and oriented-asking to go home.  Tolerating meals, appetite improving   Objective:  Vital signs in last 24 hours:  Temp:  [97.8 F (36.6 C)-98.3 F (36.8 C)] 97.8 F (36.6 C) (07/15 0752) Pulse Rate:  [70-79] 79 (07/15 0752) Resp:  [17-19] 18 (07/15 0752) BP: (150-176)/(70-89) 176/89 (07/15 0752) SpO2:  [95 %-98 %] 98 % (07/15 0752)  Weight change:  Filed Weights   08/18/21 0928 08/18/21 0934 08/18/21 1259  Weight: 101.3 kg 101.3 kg 100 kg    Intake/Output: I/O last 3 completed shifts: In: 1080 [P.O.:1080] Out: 900 [Urine:900]   Intake/Output this shift:  No intake/output data recorded.  Physical Exam: General: NAD  Head: Normocephalic, atraumatic.  Dry oral mucosal membranes  Eyes: Anicteric  Lungs:  Diminished breath sounds, [place] O2, mild basilar crackles  Heart: Regular rate and rhythm  Abdomen:  Soft, nontender, obese  Extremities: trace peripheral edema.  Neurologic: Nonfocal, moving all four extremities  Skin: No lesions  Access:     Basic Metabolic Panel: Recent Labs  Lab 08/16/21 0432 08/17/21 0217 08/18/21 0338 08/19/21 0849 08/20/21 0417 08/21/21 0426 08/22/21 0440  NA 137 138 137 135 137 137 136  K 4.9 5.1 4.7 4.1 4.1 4.1 4.0  CL 102 103 102 102 103 102 105  CO2 '28 29 27  25 27 25 24  '$ GLUCOSE 199* 213* 224* 203* 216* 242* 230*  BUN 44* 48* 39* 32* 37* 43* 53*  CREATININE 2.80* 2.74* 2.20* 2.27* 2.35* 2.63* 2.47*  CALCIUM 8.4* 8.7* 8.6* 8.7* 8.9 8.8* 8.9  MG 1.9  --   --   --   --  1.7  --   PHOS  --  3.8  --  3.1 3.8  --   --      Liver Function Tests: Recent Labs  Lab 08/17/21 0217 08/19/21 0849 08/20/21 0417  ALBUMIN 3.0* 3.0* 3.1*    No results for input(s): "LIPASE", "AMYLASE" in the last 168 hours. No results for input(s): "AMMONIA" in the last 168 hours.  CBC: Recent Labs  Lab 08/17/21 0217 08/18/21 0338 08/20/21 0417 08/20/21 1626 08/21/21 0426 08/22/21 0440  WBC 13.3* 13.8* 12.8*  --  13.4* 13.5*  HGB 11.4* 11.4* 11.8* 11.7* 11.2* 11.3*  HCT 38.8 37.9 40.4  --  37.5 38.1  MCV 84.0 83.7 83.5  --  82.4 82.5  PLT 444* 516* 520*  --  432* 494*     Cardiac Enzymes: No results for input(s): "CKTOTAL", "CKMB", "CKMBINDEX", "TROPONINI" in the last 168 hours.  BNP: Invalid input(s): "POCBNP"  CBG: Recent Labs  Lab 08/21/21 0724 08/21/21 1211 08/21/21 1634 08/21/21 2117 08/22/21 0751  GLUCAP 237* 290* 376* 306* 233*     Microbiology: Results for orders placed or performed in visit on 07/29/21  Aerobic culture     Status: None   Collection Time: 07/29/21 10:45  AM  Result Value Ref Range Status   Aerobic Bacterial Culture Final report  Final   Organism ID, Bacteria Routine flora  Final    Comment: Scant growth    Coagulation Studies: No results for input(s): "LABPROT", "INR" in the last 72 hours.   Urinalysis: No results for input(s): "COLORURINE", "LABSPEC", "PHURINE", "GLUCOSEU", "HGBUR", "BILIRUBINUR", "KETONESUR", "PROTEINUR", "UROBILINOGEN", "NITRITE", "LEUKOCYTESUR" in the last 72 hours.  Invalid input(s): "APPERANCEUR"    Imaging: No results found.   Medications:    sodium chloride     sodium chloride      apixaban  5 mg Oral BID   atorvastatin  40 mg Oral Daily   carvedilol  12.5 mg Oral BID  WC   Chlorhexidine Gluconate Cloth  6 each Topical Q0600   clopidogrel  75 mg Oral Daily   insulin aspart  0-9 Units Subcutaneous TID WC   insulin aspart  6 Units Subcutaneous TID WC   insulin detemir  24 Units Subcutaneous Daily   mouth rinse  15 mL Mouth Rinse 4 times per day   pantoprazole  40 mg Oral BID   PARoxetine  40 mg Oral Daily   senna-docusate  2 tablet Oral QHS   sodium chloride flush  3 mL Intravenous Q12H   torsemide  20 mg Oral Daily   sodium chloride, acetaminophen, albuterol, diphenhydrAMINE, famotidine, metoCLOPramide (REGLAN) injection, midazolam, ondansetron (ZOFRAN) IV, ondansetron (ZOFRAN) IV, mouth rinse, oxyCODONE, sodium chloride flush  Assessment/ Plan:  [name] is a 66 y.o.  female with past medical conditions including COPD, depression, diabetes, hypertension, stroke, and chronic kidney disease stage IV.  Patient presents to the hospital for scheduled procedure in vascular lab.  Patient was admitted for Atherosclerosis of native arteries of the extremities with ulceration (HCC) [I70.25]   Acute Kidney Injury on chronic kidney disease stage IV with baseline creatinine 2.5 and GFR of 20 on 07/30/21.  Acute kidney injury secondary to multiple factors including IV contrast and cardiorenal Chronic kidney disease is secondary to diabetes and hypertension Losartan currently held along with diuretics.  IV contrast given during angiogram.  Renal ultrasound-echogenic kidneys.  No hydronephrosis.  Patient has multiple factors at play resulting in current kidney failure.    Serum creatinine has improved spontaneously today compared to yesterday.  Volume status is acceptable.  She is now requiring 3 L oxygen supplementation.  (No oxygen at home previously).  We will start torsemide 20 mg daily for volume control which is her home dose.  Monitor closely, hopefully possibility of discharge tomorrow morning.   Lab Results  Component Value Date   CREATININE 2.47 (H)  08/22/2021   CREATININE 2.63 (H) 08/21/2021   CREATININE 2.35 (H) 08/20/2021    Intake/Output Summary (Last 24 hours) at 08/22/2021 1128 Last data filed at 08/22/2021 0449 Gross per 24 hour  Intake 600 ml  Output 900 ml  Net -300 ml    2. Anemia of chronic kidney disease Lab Results  Component Value Date   HGB 11.3 (L) 08/22/2021    Hemoglobin at goal. Continue to monitor  3. Secondary Hyperparathyroidism: with outpatient labs: PTH 187 on 07/30/21.   Lab Results  Component Value Date   CALCIUM 8.9 08/22/2021   CAION 1.08 (L) 01/29/2015   PHOS 3.8 08/20/2021     We will continue to monitor bone minerals during this admission.  4.  Diabetes mellitus type II with chronic kidney disease: insulin dependent. Home regimen includes NovoLog with an insulin pump. Most  recent hemoglobin A1c is 7.9 on 07/14/21.      LOS: [place] 7/15/202311:28 AM

## 2021-08-22 NOTE — Progress Notes (Signed)
  Progress Note   Patient: Jocelyn Wells YBO:[id number] DOB: 11-Jun-1955 DOA: 08/12/2021     10 DOS: the patient was seen and examined on 08/22/2021   Brief hospital course: Jocelyn Wells is a 66 y.o. 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 Patient is status post peripheral angioplasty with successful recannulization of the left lower extremity for limb salvage.  Patient also had a worsening renal function, developed volume overload, hyperkalemia, acute respiratory failure. Temporary HD catheter was placed in the right femoral artery, dialyzed on 7/8. 7/12.  Volume status much better, temporary dialysis catheter removed. 7/15.  Patient renal function finally getting better, restarted diuretics for congestive heart failure.  Assessment and Plan: Acute respiratory failure with hypoxemia secondary to volume overload. Acute renal failure on chronic kidney disease stage IV End-stage renal disease ruled out Hyperkalemia Acute on chronic diastolic congestive heart failure. Patient renal function starting getting better, patient does not have end-stage renal disease. Patient still has some volume overload, restart torsemide after discussion with nephrology. Recheck renal function tomorrow.    Peripheral arterial disease status post intervention. Continue atorvastatin.   Coffee-ground emesis. Rectal bleeding. Condition had improved, on PPI.  Not planning for work-up by GI. Patient does not have  any additional bleeding, I am able to restart anticoagulation   Type 2 diabetes. Levemir dose was increased to 24 units today, continue NovoLog 6 units 3 times a day and sliding scale insulin.   History of DVT. History of CVA  Restarted Eliquis.        Subjective:  Patient feels better, but still on 3 L oxygen. Does not feel short of breath, slept well last night.  Physical Exam: Vitals:   08/21/21 2116 08/21/21 2316 08/22/21 0449 08/22/21 0752  BP: (!) 150/77 (!) 159/70 (!) 155/86 (!) 176/89  Pulse: 70 70 73 79  Resp: '19 17 19 18  '$ Temp: 98.2 F (36.8 C) 98.3 F (36.8 C) 97.9 F (36.6 C) 97.8 F (36.6 C)  TempSrc: Oral Oral Oral Oral  SpO2: 95% 96% 97% 98%  Weight:      Height:       General exam: Appears calm and comfortable  Respiratory system: Still crackles in the base. Respiratory effort normal. Cardiovascular system: S1 & S2 heard, RRR. No JVD, murmurs, rubs, gallops or clicks. No pedal edema. Gastrointestinal system: Abdomen is nondistended, soft and nontender. No organomegaly or masses felt. Normal bowel sounds heard. Central nervous system: Alert and oriented. No focal neurological deficits. Extremities: Symmetric 5 x 5 power. Skin: No rashes, lesions or ulcers Psychiatry: Judgement and insight appear normal. Mood & affect appropriate.   Data Reviewed:  Lab results reviewed  Family Communication: Husband and son updated at the bedside.  Disposition: Status is: Inpatient Remains inpatient appropriate because: Severity of disease, need close monitoring renal function.  Planned Discharge Destination: Home with Home Health    Time spent: 35 minutes  Author: Sharen Hones, MD 08/22/2021 10:29 AM  For on call review [email or web address].

## 2021-08-23 DIAGNOSIS — I5033 Acute on chronic diastolic (congestive) heart failure: Secondary | ICD-10-CM | POA: Diagnosis not present

## 2021-08-23 DIAGNOSIS — N184 Chronic kidney disease, stage 4 (severe): Secondary | ICD-10-CM | POA: Diagnosis not present

## 2021-08-23 DIAGNOSIS — J9601 Acute respiratory failure with hypoxia: Secondary | ICD-10-CM | POA: Diagnosis not present

## 2021-08-23 DIAGNOSIS — N170 Acute kidney failure with tubular necrosis: Secondary | ICD-10-CM | POA: Diagnosis not present

## 2021-08-23 LAB — CBC
HCT: 39.3 % (ref 36.0–46.0)
Hemoglobin: 11.6 g/dL — ABNORMAL LOW (ref 12.0–15.0)
MCH: 24.5 pg — ABNORMAL LOW (ref 26.0–34.0)
MCHC: 29.5 g/dL — ABNORMAL LOW (ref 30.0–36.0)
MCV: 83.1 fL (ref 80.0–100.0)
Platelets: 534 10*3/uL — ABNORMAL HIGH (ref 150–400)
RBC: 4.73 MIL/uL (ref 3.87–5.11)
RDW: 16.4 % — ABNORMAL HIGH (ref 11.5–15.5)
WBC: 13 10*3/uL — ABNORMAL HIGH (ref 4.0–10.5)
nRBC: 0 % (ref 0.0–0.2)

## 2021-08-23 LAB — GLUCOSE, CAPILLARY: Glucose-Capillary: 222 mg/dL — ABNORMAL HIGH (ref 70–99)

## 2021-08-23 LAB — BASIC METABOLIC PANEL
Anion gap: 7 (ref 5–15)
BUN: 55 mg/dL — ABNORMAL HIGH (ref 8–23)
CO2: 27 mmol/L (ref 22–32)
Calcium: 8.9 mg/dL (ref 8.9–10.3)
Chloride: 106 mmol/L (ref 98–111)
Creatinine, Ser: 2.56 mg/dL — ABNORMAL HIGH (ref 0.44–1.00)
GFR, Estimated: 20 mL/min — ABNORMAL LOW (ref >60)
Glucose, Bld: 165 mg/dL — ABNORMAL HIGH (ref 70–99)
Potassium: 4.4 mmol/L (ref 3.5–5.1)
Sodium: 140 mmol/L (ref 135–145)

## 2021-08-23 LAB — MAGNESIUM: Magnesium: 1.5 mg/dL — ABNORMAL LOW (ref 1.7–2.4)

## 2021-08-23 MED ORDER — TORSEMIDE 20 MG PO TABS: 20.0000 mg | ORAL_TABLET | ORAL | 0 refills | Status: DC

## 2021-08-23 MED ORDER — TORSEMIDE 20 MG PO TABS: 20.0000 mg | ORAL_TABLET | ORAL | Status: DC

## 2021-08-23 MED ORDER — PANTOPRAZOLE SODIUM 40 MG PO TBEC: 40.0000 mg | DELAYED_RELEASE_TABLET | Freq: Two times a day (BID) | ORAL | 0 refills | Status: DC

## 2021-08-23 MED ORDER — CLOPIDOGREL BISULFATE 75 MG PO TABS: 75.0000 mg | ORAL_TABLET | Freq: Every day | ORAL | 0 refills | Status: DC

## 2021-08-23 NOTE — TOC Transition Note (Addendum)
Transition of Care [facility]) - CM/SW Discharge Note   Patient Details  Name: Jocelyn Wells MRN: [id number] Date of Birth: August 19, 1955  [facility] CM/SW Contact:  Harriet Masson, RN Phone [phone] 08/23/2021, 11:38 AM   Clinical Narrative:    Spoke with family in the pt's room today concerning the requested DME for RW. Ordered DME and requested home delivery as requested by the family. Spoke with [employer] for DME requested Mardene Celeste) and confirmed the address on file via Epic. No additional request or recommendations at this time. Also contracted Corene Cornea with update on pt's discharge today for HHPT/OT with [facility].  Note: declined 3-1 DME at this time.   TOC will remain available to assist with any additional needs.   Final next level of care: [place] Barriers to Discharge: Barriers Resolved   Patient Goals and CMS Choice        Discharge Placement                  Name of family member notified: spoke with family Patient and family notified of of transfer: 08/23/21  Discharge Plan and Services   Discharge Planning Services: CM Consult            DME Arranged: Gilford Rile rolling DME Agency: [employer] Date DME Agency Contacted: 08/23/21 Time DME Agency Contacted: [id number] Representative spoke with at DME Agency: Mardene Celeste HH Arranged: PT, OT [facility] Agency: [place] ([place]) Date [place]: 08/17/21 Time [place]: [phone] Representative spoke with at [place]: [place] ([place]) Interventions     Readmission Risk Interventions     No data to display

## 2021-08-23 NOTE — Care Management Obs Status (Signed)
SATURATION QUALIFICATIONS: (This note is used to comply with regulatory documentation for home oxygen)  Patient Saturations on Room Air at Rest = 97%  Patient Saturations on Room Air while standing = 94%  Patient Saturations on 0 Liters of oxygen while Ambulating = 90%  Please briefly explain why patient needs home oxygen:

## 2021-08-23 NOTE — Progress Notes (Signed)
Pt discharged as per MD order. Pt tele and IV d/c. Pt IV cathter was intact. IV site clean, dry and intact. Pt and family given discharge teaching. They verbalized understanding. No concerns voiced at this time. Pt escorted to her transport via wheelchair.

## 2021-08-23 NOTE — Discharge Summary (Signed)
Physician Discharge Summary   Patient: Jocelyn Wells MRN: [id number] DOB: 07/01/1955  Admit date:     08/12/2021  Discharge date: 08/23/21  Discharge Physician: Sharen Hones   PCP: Gwyneth Sprout, FNP   Recommendations at discharge:   Follow-up with PCP in 1 week. Follow-up  GI in 2 weeks. Follow-up with nephrology in 1 week.  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 [facility] Course: Jocelyn Wells is a 66 y.o. 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 Patient is status post peripheral angioplasty with successful recannulization of the left lower extremity for limb salvage.  Patient also had a worsening renal function, developed volume overload, hyperkalemia, acute respiratory failure. Temporary HD catheter was placed in the right femoral artery, dialyzed on 7/8. 7/12.  Volume status much better, temporary dialysis catheter removed. 7/15.  Patient renal function finally getting better, restarted diuretics  for congestive heart failure.  Assessment and Plan: Acute respiratory failure with hypoxemia secondary to volume overload. Acute renal failure on chronic kidney disease stage IV End-stage renal disease ruled out Hyperkalemia Acute on chronic diastolic congestive heart failure. Patient renal function starting getting better, patient does not have end-stage renal disease. Restarted torsemide, patient renal function started worse today.  Discussed with nephrology, okay to discharge, but change torsemide to Monday Wednesday and Friday.  Patient be followed with nephrology in the near future to recheck renal function.  Patient also be followed by PCP.     Peripheral arterial disease status post intervention. Continue atorvastatin, Plavix   Coffee-ground emesis. Rectal bleeding. Condition had improved, on PPI.  Not planning for work-up by GI. Patient does not have any additional bleeding, I am able to restart anticoagulation.  Globin has been stable.  Patient be followed with GI in 2 weeks   Type 2 diabetes. Resume home regimen.   History of DVT. History of CVA  Restarted Eliquis.         Consultants: Nephrology, GI. Procedures performed: Peripheral artery angioplasty Disposition: Home health Diet recommendation:  Discharge Diet Orders (From admission, onward)     Start     Ordered   08/23/21 0000  Diet general       Comments: Renal diet, carb controlled   08/23/21 0938           Renal diet DISCHARGE MEDICATION: Allergies as of 08/23/2021       Reactions   Codeine Nausea And Vomiting        Medication List     STOP taking these medications    B-D INS SYRINGE 0.5CC/31GX5/16 31G X 5/16" 0.5 ML  Misc Generic drug: Insulin Syringe-Needle U-100   denosumab 60 MG/ML Sosy injection Commonly known as: PROLIA   diclofenac 75 MG EC tablet Commonly known as: VOLTAREN   hydrochlorothiazide 25 MG tablet Commonly known as: HYDRODIURIL   losartan 50 MG  tablet Commonly known as: COZAAR   ondansetron 4 MG tablet Commonly known as: Zofran   sulfamethoxazole-trimethoprim 800-160 MG tablet Commonly known as: BACTRIM DS       TAKE these medications    albuterol 108 (90 Base) MCG/ACT inhaler Commonly known as: VENTOLIN HFA TAKE 2 PUFFS BY MOUTH EVERY 6 HOURS AS NEEDED FOR WHEEZE OR SHORTNESS OF BREATH   apixaban 5 MG Tabs tablet Commonly known as: Eliquis Take 1 tablet (5 mg total) by mouth 2 (two) times daily.   atorvastatin 40 MG tablet Commonly known as: LIPITOR TAKE 1 TABLET BY MOUTH EVERY DAY   carvedilol 12.5 MG tablet Commonly known as: COREG Take 1 tablet (12.5 mg total) by mouth 2 (two) times daily with a meal.   clopidogrel 75 MG tablet Commonly known as: PLAVIX Take 1 tablet (75 mg total) by mouth daily. Start taking on: August 24, 2021   Dulaglutide 3 MG/0.5ML Sopn Inject into the skin once a week.   fluticasone 50 MCG/ACT nasal spray Commonly known as: FLONASE Place 2 sprays into both nostrils daily.   gabapentin 300 MG capsule Commonly known as: NEURONTIN Take 300 mg by mouth 2 (two) times daily.   glucose blood test strip OneTouch Verio strips   insulin aspart 100 UNIT/ML injection Commonly known as: novoLOG Inject up to 100 units daily in insulin pump   insulin pump Soln Per endocrinoloy   loratadine 10 MG tablet Commonly known as: CLARITIN Take 1 tablet by mouth daily.   Omnipod DASH Pods (Gen 4) Misc USE 1 POD EACH EVERY 72    HOURS   optichamber diamond Misc Use with inhaler to ensure medication is delivered throughout lungs   pantoprazole 40 MG tablet Commonly known as: PROTONIX Take 1 tablet (40 mg total) by mouth 2 (two) times daily.   PARoxetine 40 MG tablet Commonly known as: PAXIL Take 1 tablet (40 mg total) by mouth daily.   torsemide 20 MG tablet Commonly known as: DEMADEX Take 1 tablet (20 mg total) by mouth every Monday, Wednesday, and Friday. Start taking on: August 24, 2021 What changed: when to take this   Vitamin D (Ergocalciferol) 1.25 MG (50000 UNIT) Caps capsule Commonly known as: DRISDOL Take 50,000 Units by mouth once a week. ('Sundays)               Durable Medical Equipment  (From admission, onward)           Start     Ordered   08/23/21 0935  For home use only DME oxygen  Once       Question Answer Comment  Length of Need 6 Months   Mode or (Route) Nasal cannula   Liters per Minute 3   Frequency Continuous (stationary and portable oxygen unit needed)   Oxygen conserving device Yes   Oxygen delivery system Gas      08/23/21 0934   08/23/21 0934  For home use only DME Walker rolling  Once       Question Answer Comment  Walker: With 5 Inch Wheels   Patient needs a walker to treat with the following condition Acute on chronic diastolic (congestive) heart failure (HCC)      07'$ /16/23 3846  08/21/21 0912  For home use only DME Walker rolling  Once       Question Answer Comment  Walker: With [place]   Patient needs a walker to treat with the following condition Generalized weakness      08/21/21 0911   08/21/21 0912  For home use only DME 3 n 1  Once        08/21/21 5732            Follow-up Information     Tally Joe T, FNP Follow up in 1 week(s).   Specialty: Family Medicine Contact information: [place] [address] [phone]         Annamaria Helling, DO Follow up in 2 week(s).   Specialty: Gastroenterology Contact information: [place] [address] [phone]         Murlean Iba, MD Follow up in 1 week(s).   Specialty: Nephrology Contact information: [place] [address] [phone]                Discharge Exam: Danley Danker Weights   08/18/21 9381 08/18/21 0934 08/18/21 1259  Weight: 101.3 kg 101.3 kg 100 kg   General exam: Appears calm and comfortable  Respiratory system:  Clear to auscultation. Respiratory effort normal. Cardiovascular system: S1 & S2 heard, RRR. No JVD, murmurs, rubs, gallops or clicks. No pedal edema. Gastrointestinal system: Abdomen is nondistended, soft and nontender. No organomegaly or masses felt. Normal bowel sounds heard. Central nervous system: Alert and oriented. No focal neurological deficits. Extremities: Symmetric 5 x 5 power. Skin: No rashes, lesions or ulcers Psychiatry: Judgement and insight appear normal. Mood & affect appropriate.    Condition at discharge: fair  The results of significant diagnostics from this hospitalization (including imaging, microbiology, ancillary and laboratory) are listed below for reference.   Imaging Studies: VAS [place] LOWER EXTREMITY VENOUS REFLUX  Result Date: 08/17/2021  Lower Venous Reflux Study Patient Name:  JANAL HAAK Castilleja  Date of Exam:   08/05/2021 Medical Rec #: [id number]       Accession #:    [id number] Date of Birth: [birth year]       Patient Gender: F Patient Age:   41 years Exam [facility] Procedure:      VAS [place] LOWER EXTREMITY VENOUS REFLUX Referring Phys: Eulogio Ditch --------------------------------------------------------------------------------  Indications: Swelling.  Risk Factors: DVT Hx of rt dvt. Performing Technologist: Concha Norway RVT  Examination Guidelines: A complete evaluation includes B-mode imaging, spectral Doppler, color Doppler, and power Doppler as needed of all accessible portions of each vessel. Bilateral testing is considered an integral part of a complete examination. Limited examinations for reoccurring indications may be performed as noted. The reflux portion of the exam is performed with the patient in reverse Trendelenburg. Significant venous reflux is defined as >500 ms in the superficial venous system, and >1 second in the deep venous system.  Venous Reflux Times +----------+---------+------+-----------+------------+--------+ RIGHT     Reflux  NoRefluxReflux TimeDiameter cmsComments                     Yes                                  +----------+---------+------+-----------+------------+--------+ CFV                 yes                                  +----------+---------+------+-----------+------------+--------+  FV mid              yes   >1 second                      +----------+---------+------+-----------+------------+--------+ Popliteal           yes   >1 second                      +----------+---------+------+-----------+------------+--------+ GSV at SFJ          yes    >500 ms      1.01             +----------+---------+------+-----------+------------+--------+  +--------------+---------+------+-----------+------------+--------+ LEFT          Reflux NoRefluxReflux TimeDiameter cmsComments                         Yes                                  +--------------+---------+------+-----------+------------+--------+ GSV dist thigh          yes    >500 ms      .37              +--------------+---------+------+-----------+------------+--------+ GSV prox calf           yes    >500 ms      .31              +--------------+---------+------+-----------+------------+--------+   Summary: Bilateral: - No evidence of deep vein thrombosis seen in the lower extremities, bilaterally, from the common femoral through the popliteal veins. - No evidence of superficial venous thrombosis in the lower extremities, bilaterally.  Right: - Venous reflux is noted in the right common femoral vein. - Venous reflux is noted in the right sapheno-femoral junction. - Venous reflux is noted in the right femoral vein. - Venous reflux is noted in the right popliteal vein.  Left: - Venous reflux is noted in the left greater saphenous vein in the thigh. - Venous reflux is noted in the left greater saphenous vein in the calf.  *See table(s) above for measurements and observations. Electronically signed by  [name] MD on 08/17/2021 at 1:01:09 PM.    Final    US RENAL  Result Date: 08/14/2021 CLINICAL DATA:  Acute kidney injury EXAM: RENAL / URINARY TRACT ULTRASOUND COMPLETE COMPARISON:  04/07/2021 ultrasound FINDINGS: Right Kidney: Renal measurements: 11.4 x 5.5 x 4.9 cm = volume: 159 mL. Cortex is echogenic. No hydronephrosis. Subcentimeter 7 mm hypoechoic area at the upper pole. Left Kidney: Renal measurements: 11.3 x 5.5 x 5.3 cm = volume: 171 mL. Cortex is echogenic. No mass or hydronephrosis Bladder: Appears normal for degree of bladder distention. Other: None. IMPRESSION: 1. Slightly echogenic cortex consistent with medical renal disease. No hydronephrosis 2. Subcentimeter hypoechoic focus at the upper pole right kidney, probably a tiny cyst but small size limits further assessment. Suggest six-month ultrasound follow-up Electronically Signed   By: Donavan Foil M.D.   On: 08/14/2021 17:52   PERIPHERAL VASCULAR CATHETERIZATION  Result Date: 08/14/2021 See surgical note for result.  DG Chest Port 1 View  Result Date: 08/14/2021 CLINICAL DATA:  Shortness of breath EXAM: PORTABLE CHEST 1 VIEW COMPARISON:  08/13/2021 FINDINGS: Chronic cardiomegaly. Chronic interstitial coarsening/airway thickening. Streaky density in the right lower lung. Milder opacity at the level of the lingula. No  effusion or pneumothorax. IMPRESSION: 1. Lower lobe infiltrates, greater on the right. 2. Chronic cardiomegaly and airway thickening. Electronically Signed   By: Jorje Guild M.D.   On: 08/14/2021 06:22   DG Chest 2 View  Result Date: 08/13/2021 CLINICAL DATA:  Vomiting EXAM: CHEST - 2 VIEW COMPARISON:  07/14/2021 FINDINGS: Frontal and lateral views of the chest demonstrates stable enlargement the cardiac silhouette. Loop recorder left anterior chest unchanged. No acute airspace disease, effusion, or pneumothorax. No acute bony abnormalities. IMPRESSION: 1. Stable enlarged cardiac silhouette. 2. No acute airspace  disease. Electronically Signed   By: Randa Ngo M.D.   On: 08/13/2021 16:06   DG Abd 1 View  Result Date: 08/13/2021 CLINICAL DATA:  Vomiting EXAM: ABDOMEN - 1 VIEW COMPARISON:  None Available. FINDINGS: No dilated large or small bowel. Moderate volume stool throughout the colon. Gas the rectum. Several small gallstones noted. Degenerative changes of the spine. IMPRESSION: 1. No evidence of bowel obstruction. 2. Moderate volume stool throughout the colon. 3. Cholelithiasis. Electronically Signed   By: Suzy Bouchard M.D.   On: 08/13/2021 16:06   PERIPHERAL VASCULAR CATHETERIZATION  Result Date: 08/12/2021 See surgical note for result.   Microbiology: Results for orders placed or performed in visit on 07/29/21  Aerobic culture     Status: None   Collection Time: 07/29/21 10:45 AM  Result Value Ref Range Status   Aerobic Bacterial Culture Final report  Final   Organism ID, Bacteria Routine flora  Final    Comment: Scant growth    Labs: CBC: Recent Labs  Lab 08/18/21 0338 08/20/21 0417 08/20/21 1626 08/21/21 0426 08/22/21 0440 08/23/21 0408  WBC 13.8* 12.8*  --  13.4* 13.5* 13.0*  HGB 11.4* 11.8* 11.7* 11.2* 11.3* 11.6*  HCT 37.9 40.4  --  37.5 38.1 39.3  MCV 83.7 83.5  --  82.4 82.5 83.1  PLT 516* 520*  --  432* 494* 707*   Basic Metabolic Panel: Recent Labs  Lab 08/17/21 0217 08/18/21 0338 08/19/21 0849 08/20/21 0417 08/21/21 0426 08/22/21 0440 08/23/21 0408  NA 138   < > 135 137 137 136 140  K 5.1   < > 4.1 4.1 4.1 4.0 4.4  CL 103   < > 102 103 102 105 106  CO2 29   < > '25 27 25 24 27  '$ GLUCOSE 213*   < > 203* 216* 242* 230* 165*  BUN 48*   < > 32* 37* 43* 53* 55*  CREATININE 2.74*   < > 2.27* 2.35* 2.63* 2.47* 2.56*  CALCIUM 8.7*   < > 8.7* 8.9 8.8* 8.9 8.9  MG  --   --   --   --  1.7  --  1.5*  PHOS 3.8  --  3.1 3.8  --   --   --    < > = values in this interval not displayed.   Liver Function Tests: Recent Labs  Lab 08/17/21 0217 08/19/21 0849  08/20/21 0417  ALBUMIN 3.0* 3.0* 3.1*   CBG: Recent Labs  Lab 08/22/21 0751 08/22/21 1133 08/22/21 1654 08/22/21 1944 08/23/21 0754  GLUCAP 233* 391* 311* 329* 222*    Discharge time spent: greater than 30 minutes.  Signed: Sharen Hones, MD [facility] 08/23/2021

## 2021-08-23 NOTE — Progress Notes (Signed)
[facility]  ROUNDING NOTE   Subjective:   Jocelyn Wells is a 66 year old female with past medical conditions including COPD, depression, diabetes, hypertension, stroke, and chronic kidney disease stage IV.  Patient presents to the hospital for scheduled procedure in vascular lab.  Patient was admitted for Atherosclerosis of native arteries of the extremities with ulceration [facility]) [I70.25]  Patient is known to our practice and is followed by Dr.Korrapati outpatient.  Patient was last seen in office on 07/30/2021.   Patient seen sitting up in bed, multiple family members at bedside. Patient is excited to go home today. Reports she saw her pets yesterday afternoon and is ready to go home to them.    Tolerating meals, appetite improving    Objective:  Vital signs in last 24 hours:  Temp:  [97.4 F (36.3 C)-98.4 F (36.9 C)] 97.4 F (36.3 C) (07/16 0758) Pulse Rate:  [68-70] 69 (07/16 0010) Resp:  [15-29] 17 (07/16 0758) BP: (152-180)/(70-81) 172/81 (07/16 0758) SpO2:  [97 %-99 %] 97 % (07/16 0758)  Weight change:  Filed Weights   08/18/21 0928 08/18/21 0934 08/18/21 1259  Weight: 101.3 kg 101.3 kg 100 kg    Intake/Output: I/O last 3 completed shifts: In: 840 [P.O.:840] Out: 1750 [Urine:1750]   Intake/Output this shift:  Total I/O In: 240 [P.O.:240] Out: 275 [Urine:275]  Physical Exam: General: NAD  Head: Normocephalic, atraumatic.  Dry oral mucosal membranes  Eyes: Anicteric  Lungs:  Diminished breath sounds,  O2  Heart: Regular rate and rhythm  Abdomen:  Soft, nontender, obese  Extremities: No peripheral edema.  Neurologic: Nonfocal, moving all four extremities  Skin: No lesions  Access:     Basic Metabolic Panel: Recent Labs  Lab 08/17/21 0217 08/18/21 0338 08/19/21 0849 08/20/21 0417 08/21/21 0426 08/22/21 0440 08/23/21 0408  NA 138   < > 135 137 137 136 140  K 5.1   < > 4.1 4.1 4.1 4.0 4.4  CL 103   < > 102 103 102 105 106  CO2 29    < > '25 27 25 24 27  '$ GLUCOSE 213*   < > 203* 216* 242* 230* 165*  BUN 48*   < > 32* 37* 43* 53* 55*  CREATININE 2.74*   < > 2.27* 2.35* 2.63* 2.47* 2.56*  CALCIUM 8.7*   < > 8.7* 8.9 8.8* 8.9 8.9  MG  --   --   --   --  1.7  --  1.5*  PHOS 3.8  --  3.1 3.8  --   --   --    < > = values in this interval not displayed.     Liver Function Tests: Recent Labs  Lab 08/17/21 0217 08/19/21 0849 08/20/21 0417  ALBUMIN 3.0* 3.0* 3.1*    No results for input(s): "LIPASE", "AMYLASE" in the last 168 hours. No results for input(s): "AMMONIA" in the last 168 hours.  CBC: Recent Labs  Lab 08/18/21 0338 08/20/21 0417 08/20/21 1626 08/21/21 0426 08/22/21 0440 08/23/21 0408  WBC 13.8* 12.8*  --  13.4* 13.5* 13.0*  HGB 11.4* 11.8* 11.7* 11.2* 11.3* 11.6*  HCT 37.9 40.4  --  37.5 38.1 39.3  MCV 83.7 83.5  --  82.4 82.5 83.1  PLT 516* 520*  --  432* 494* 534*     Cardiac Enzymes: No results for input(s): "CKTOTAL", "CKMB", "CKMBINDEX", "TROPONINI" in the last 168 hours.  BNP: Invalid input(s): "POCBNP"  CBG: Recent Labs  Lab 08/22/21 0751 08/22/21  1133 08/22/21 1654 08/22/21 1944 08/23/21 0754  GLUCAP 233* 391* 311* 329* 222*     Microbiology: Results for orders placed or performed in visit on 07/29/21  Aerobic culture     Status: None   Collection Time: 07/29/21 10:45 AM  Result Value Ref Range Status   Aerobic Bacterial Culture Final report  Final   Organism ID, Bacteria Routine flora  Final    Comment: Scant growth    Coagulation Studies: No results for input(s): "LABPROT", "INR" in the last 72 hours.   Urinalysis: No results for input(s): "COLORURINE", "LABSPEC", "PHURINE", "GLUCOSEU", "HGBUR", "BILIRUBINUR", "KETONESUR", "PROTEINUR", "UROBILINOGEN", "NITRITE", "LEUKOCYTESUR" in the last 72 hours.  Invalid input(s): "APPERANCEUR"    Imaging: No results found.   Medications:    sodium chloride     sodium chloride      apixaban  5 mg Oral BID    atorvastatin  40 mg Oral Daily   carvedilol  12.5 mg Oral BID WC   Chlorhexidine Gluconate Cloth  6 each Topical Q0600   clopidogrel  75 mg Oral Daily   insulin aspart  0-9 Units Subcutaneous TID WC   insulin aspart  6 Units Subcutaneous TID WC   insulin detemir  24 Units Subcutaneous Daily   mouth rinse  15 mL Mouth Rinse 4 times per day   pantoprazole  40 mg Oral BID   PARoxetine  40 mg Oral Daily   senna-docusate  2 tablet Oral QHS   sodium chloride flush  3 mL Intravenous Q12H   [START ON 08/24/2021] torsemide  20 mg Oral Q48H   sodium chloride, acetaminophen, albuterol, diphenhydrAMINE, famotidine, metoCLOPramide (REGLAN) injection, midazolam, ondansetron (ZOFRAN) IV, ondansetron (ZOFRAN) IV, mouth rinse, oxyCODONE, sodium chloride flush  Assessment/ Plan:  [name] is a 66 y.o.  female with past medical conditions including COPD, depression, diabetes, hypertension, stroke, and chronic kidney disease stage IV.  Patient presents to the hospital for scheduled procedure in vascular lab.  Patient was admitted for Atherosclerosis of native arteries of the extremities with ulceration (HCC) [I70.25]   Acute Kidney Injury on chronic kidney disease stage IV with baseline creatinine 2.5 and GFR of 20 on 07/30/21.  Acute kidney injury secondary to multiple factors including IV contrast and cardiorenal Chronic kidney disease is secondary to diabetes and hypertension Losartan currently held along, restarted torsemide.  IV contrast given during angiogram.  Renal ultrasound-echogenic kidneys.  No hydronephrosis.  Patient has multiple factors at play resulting in current kidney failure.    Serum creatinine 2.56, Volume status is acceptable.  She is now requiring 3 L oxygen supplementation.  (No oxygen at home previously).  Restarted torsemide 20 mg MWF for volume control. We discussed that she may take an additional dose if she feels like she is retaining fluid or experiencing shortness of  breath or edema   Patient will be scheduled for a follow up appointment outpatient with our office.   Lab Results  Component Value Date   CREATININE 2.56 (H) 08/23/2021   CREATININE 2.47 (H) 08/22/2021   CREATININE 2.63 (H) 08/21/2021    Intake/Output Summary (Last 24 hours) at 08/23/2021 1035 Last data filed at 08/23/2021 1006 Gross per 24 hour  Intake 720 ml  Output 1125 ml  Net -405 ml    2. Anemia of chronic kidney disease Lab Results  Component Value Date   HGB 11.6 (L) 08/23/2021    Hemoglobin at goal. Continue to monitor  3. Secondary Hyperparathyroidism: with outpatient labs: PTH  187 on 07/30/21.   Lab Results  Component Value Date   CALCIUM 8.9 08/23/2021   CAION 1.08 (L) 01/29/2015   PHOS 3.8 08/20/2021     We will continue to monitor bone minerals during this admission.  4.  Diabetes mellitus type II with chronic kidney disease: insulin dependent. Home regimen includes NovoLog with an insulin pump. Most recent hemoglobin A1c is 7.9 on 07/14/21.      LOS: [place] 7/16/202310:35 AM

## 2021-08-24 ENCOUNTER — Telehealth (INDEPENDENT_AMBULATORY_CARE_PROVIDER_SITE_OTHER): Payer: Self-pay

## 2021-08-24 ENCOUNTER — Telehealth: Payer: Self-pay | Admitting: Cardiology

## 2021-08-24 ENCOUNTER — Telehealth: Payer: Self-pay

## 2021-08-24 DIAGNOSIS — I70248 Atherosclerosis of native arteries of left leg with ulceration of other part of lower left leg: Secondary | ICD-10-CM | POA: Diagnosis not present

## 2021-08-24 DIAGNOSIS — Z79899 Other long term (current) drug therapy: Secondary | ICD-10-CM | POA: Diagnosis not present

## 2021-08-24 DIAGNOSIS — Z95820 Peripheral vascular angioplasty status with implants and grafts: Secondary | ICD-10-CM | POA: Diagnosis not present

## 2021-08-24 DIAGNOSIS — I110 Hypertensive heart disease with heart failure: Secondary | ICD-10-CM | POA: Diagnosis not present

## 2021-08-24 DIAGNOSIS — Z7901 Long term (current) use of anticoagulants: Secondary | ICD-10-CM | POA: Diagnosis not present

## 2021-08-24 DIAGNOSIS — M48061 Spinal stenosis, lumbar region without neurogenic claudication: Secondary | ICD-10-CM | POA: Diagnosis not present

## 2021-08-24 DIAGNOSIS — Z86718 Personal history of other venous thrombosis and embolism: Secondary | ICD-10-CM | POA: Diagnosis not present

## 2021-08-24 DIAGNOSIS — N184 Chronic kidney disease, stage 4 (severe): Secondary | ICD-10-CM | POA: Diagnosis not present

## 2021-08-24 DIAGNOSIS — I480 Paroxysmal atrial fibrillation: Secondary | ICD-10-CM | POA: Diagnosis not present

## 2021-08-24 DIAGNOSIS — Z79891 Long term (current) use of opiate analgesic: Secondary | ICD-10-CM | POA: Diagnosis not present

## 2021-08-24 DIAGNOSIS — Z7985 Long-term (current) use of injectable non-insulin antidiabetic drugs: Secondary | ICD-10-CM | POA: Diagnosis not present

## 2021-08-24 DIAGNOSIS — Z6835 Body mass index (BMI) 35.0-35.9, adult: Secondary | ICD-10-CM | POA: Diagnosis not present

## 2021-08-24 DIAGNOSIS — E1122 Type 2 diabetes mellitus with diabetic chronic kidney disease: Secondary | ICD-10-CM | POA: Diagnosis not present

## 2021-08-24 DIAGNOSIS — Z87891 Personal history of nicotine dependence: Secondary | ICD-10-CM | POA: Diagnosis not present

## 2021-08-24 DIAGNOSIS — F419 Anxiety disorder, unspecified: Secondary | ICD-10-CM | POA: Diagnosis not present

## 2021-08-24 DIAGNOSIS — I70201 Unspecified atherosclerosis of native arteries of extremities, right leg: Secondary | ICD-10-CM | POA: Diagnosis not present

## 2021-08-24 DIAGNOSIS — Z8673 Personal history of transient ischemic attack (TIA), and cerebral infarction without residual deficits: Secondary | ICD-10-CM | POA: Diagnosis not present

## 2021-08-24 DIAGNOSIS — E1151 Type 2 diabetes mellitus with diabetic peripheral angiopathy without gangrene: Secondary | ICD-10-CM | POA: Diagnosis not present

## 2021-08-24 DIAGNOSIS — Z9181 History of falling: Secondary | ICD-10-CM | POA: Diagnosis not present

## 2021-08-24 DIAGNOSIS — Z794 Long term (current) use of insulin: Secondary | ICD-10-CM | POA: Diagnosis not present

## 2021-08-24 DIAGNOSIS — J449 Chronic obstructive pulmonary disease, unspecified: Secondary | ICD-10-CM | POA: Diagnosis not present

## 2021-08-24 DIAGNOSIS — L97829 Non-pressure chronic ulcer of other part of left lower leg with unspecified severity: Secondary | ICD-10-CM | POA: Diagnosis not present

## 2021-08-24 DIAGNOSIS — I5033 Acute on chronic diastolic (congestive) heart failure: Secondary | ICD-10-CM | POA: Diagnosis not present

## 2021-08-24 DIAGNOSIS — Z48812 Encounter for surgical aftercare following surgery on the circulatory system: Secondary | ICD-10-CM | POA: Diagnosis not present

## 2021-08-24 NOTE — Telephone Encounter (Signed)
Spoke with Malachy Mood about pt's left lower leg wound.  She states it is necrotic and black.  Per Eulogio Ditch NP Mal[facility] Mood will be sending pictures of this wound.  Arna Medici will look at the pictures and will decide what should be done at that time.

## 2021-08-24 NOTE — Telephone Encounter (Signed)
Pt states she was released from the hospital yesterday and they did an echo during her stay. She states she received a call today about scheduling an echo with [place]. Pt wants to know does she still needs to schedule an echo or not.

## 2021-08-24 NOTE — Telephone Encounter (Signed)
Called patient and informed her that they did not do an Echo while she was in the hospital, they did Lower extremity and renal studies, so we do still need to have her get the Echocardiogram.  Patient verbalized understanding and agreed with plan. Will route to [employer] to call patient and get her scheduled.

## 2021-08-24 NOTE — Telephone Encounter (Signed)
Copied from [place] [id number]. Topic: Quick Communication - Home Health Verbal Orders >> Aug 24, 2021 11:06 AM Leilani Able wrote: Caller/Agency: Malachy Mood / [place] Number: [phone] Requesting/Skilled Nursing Frequency:  For education for 1 time a wk for 9 wks

## 2021-08-25 NOTE — Telephone Encounter (Signed)
LMOV  

## 2021-08-25 NOTE — Telephone Encounter (Signed)
Spoke with Malachy Mood and gave verbal orders.

## 2021-08-26 ENCOUNTER — Telehealth: Payer: Self-pay | Admitting: Family Medicine

## 2021-08-26 DIAGNOSIS — I110 Hypertensive heart disease with heart failure: Secondary | ICD-10-CM | POA: Diagnosis not present

## 2021-08-26 DIAGNOSIS — E1122 Type 2 diabetes mellitus with diabetic chronic kidney disease: Secondary | ICD-10-CM | POA: Diagnosis not present

## 2021-08-26 DIAGNOSIS — N184 Chronic kidney disease, stage 4 (severe): Secondary | ICD-10-CM | POA: Diagnosis not present

## 2021-08-26 DIAGNOSIS — I5033 Acute on chronic diastolic (congestive) heart failure: Secondary | ICD-10-CM | POA: Diagnosis not present

## 2021-08-26 DIAGNOSIS — Z48812 Encounter for surgical aftercare following surgery on the circulatory system: Secondary | ICD-10-CM | POA: Diagnosis not present

## 2021-08-26 DIAGNOSIS — E1151 Type 2 diabetes mellitus with diabetic peripheral angiopathy without gangrene: Secondary | ICD-10-CM | POA: Diagnosis not present

## 2021-08-26 NOTE — Telephone Encounter (Signed)
Mrs. Jocelyn Wells advised of approved orders.

## 2021-08-26 NOTE — Telephone Encounter (Signed)
Home Health Verbal Orders - Caller/Agency: [facility] Number: [phone] Requesting /PT Frequency: 1 wk 1, 2 wk 3, 1 wk 5

## 2021-08-27 DIAGNOSIS — I129 Hypertensive chronic kidney disease with stage 1 through stage 4 chronic kidney disease, or unspecified chronic kidney disease: Secondary | ICD-10-CM | POA: Diagnosis not present

## 2021-08-27 DIAGNOSIS — I509 Heart failure, unspecified: Secondary | ICD-10-CM | POA: Diagnosis not present

## 2021-08-27 DIAGNOSIS — I639 Cerebral infarction, unspecified: Secondary | ICD-10-CM | POA: Diagnosis not present

## 2021-08-27 DIAGNOSIS — E663 Overweight: Secondary | ICD-10-CM | POA: Diagnosis not present

## 2021-08-27 DIAGNOSIS — F32A Depression, unspecified: Secondary | ICD-10-CM | POA: Diagnosis not present

## 2021-08-27 DIAGNOSIS — I5033 Acute on chronic diastolic (congestive) heart failure: Secondary | ICD-10-CM | POA: Diagnosis not present

## 2021-08-27 DIAGNOSIS — N184 Chronic kidney disease, stage 4 (severe): Secondary | ICD-10-CM | POA: Diagnosis not present

## 2021-08-27 DIAGNOSIS — U071 COVID-19: Secondary | ICD-10-CM | POA: Diagnosis not present

## 2021-08-27 DIAGNOSIS — R809 Proteinuria, unspecified: Secondary | ICD-10-CM | POA: Diagnosis not present

## 2021-08-27 DIAGNOSIS — D631 Anemia in chronic kidney disease: Secondary | ICD-10-CM | POA: Diagnosis not present

## 2021-08-27 DIAGNOSIS — N2581 Secondary hyperparathyroidism of renal origin: Secondary | ICD-10-CM | POA: Diagnosis not present

## 2021-08-27 DIAGNOSIS — I110 Hypertensive heart disease with heart failure: Secondary | ICD-10-CM | POA: Diagnosis not present

## 2021-08-27 DIAGNOSIS — I10 Essential (primary) hypertension: Secondary | ICD-10-CM | POA: Diagnosis not present

## 2021-08-27 DIAGNOSIS — E1122 Type 2 diabetes mellitus with diabetic chronic kidney disease: Secondary | ICD-10-CM | POA: Diagnosis not present

## 2021-08-27 DIAGNOSIS — Z48812 Encounter for surgical aftercare following surgery on the circulatory system: Secondary | ICD-10-CM | POA: Diagnosis not present

## 2021-08-27 DIAGNOSIS — E1151 Type 2 diabetes mellitus with diabetic peripheral angiopathy without gangrene: Secondary | ICD-10-CM | POA: Diagnosis not present

## 2021-08-27 NOTE — Telephone Encounter (Signed)
Patient will also need her follow up appointment scheduled for after the Echo.

## 2021-08-28 ENCOUNTER — Other Ambulatory Visit (INDEPENDENT_AMBULATORY_CARE_PROVIDER_SITE_OTHER): Payer: Self-pay | Admitting: Nurse Practitioner

## 2021-08-28 ENCOUNTER — Telehealth: Payer: Self-pay | Admitting: Family Medicine

## 2021-08-28 DIAGNOSIS — Z48812 Encounter for surgical aftercare following surgery on the circulatory system: Secondary | ICD-10-CM | POA: Diagnosis not present

## 2021-08-28 DIAGNOSIS — I5033 Acute on chronic diastolic (congestive) heart failure: Secondary | ICD-10-CM | POA: Diagnosis not present

## 2021-08-28 DIAGNOSIS — I110 Hypertensive heart disease with heart failure: Secondary | ICD-10-CM | POA: Diagnosis not present

## 2021-08-28 DIAGNOSIS — N184 Chronic kidney disease, stage 4 (severe): Secondary | ICD-10-CM | POA: Diagnosis not present

## 2021-08-28 DIAGNOSIS — E1151 Type 2 diabetes mellitus with diabetic peripheral angiopathy without gangrene: Secondary | ICD-10-CM | POA: Diagnosis not present

## 2021-08-28 DIAGNOSIS — E1122 Type 2 diabetes mellitus with diabetic chronic kidney disease: Secondary | ICD-10-CM | POA: Diagnosis not present

## 2021-08-28 MED ORDER — SILVER SULFADIAZINE 1 % EX CREA: 1.0000 | TOPICAL_CREAM | Freq: Every day | CUTANEOUS | 0 refills | Status: DC

## 2021-08-28 NOTE — Telephone Encounter (Signed)
Jocelyn Wells preformed the OT Eval as requested but the pt is not in need of OT services / please advise

## 2021-08-28 NOTE — Telephone Encounter (Signed)
Spoke with Arna Medici and she said to apply sulfa silverdene cream daily.  I called and gave verbal orders to [facility] and she stated understanding.

## 2021-08-28 NOTE — Telephone Encounter (Signed)
[place] sent rx for silverdene cream to pharmacy

## 2021-08-28 NOTE — Telephone Encounter (Signed)
LMOV to schedule follow up 

## 2021-08-31 DIAGNOSIS — N184 Chronic kidney disease, stage 4 (severe): Secondary | ICD-10-CM | POA: Diagnosis not present

## 2021-08-31 DIAGNOSIS — Z48812 Encounter for surgical aftercare following surgery on the circulatory system: Secondary | ICD-10-CM | POA: Diagnosis not present

## 2021-08-31 DIAGNOSIS — E1151 Type 2 diabetes mellitus with diabetic peripheral angiopathy without gangrene: Secondary | ICD-10-CM | POA: Diagnosis not present

## 2021-08-31 DIAGNOSIS — I110 Hypertensive heart disease with heart failure: Secondary | ICD-10-CM | POA: Diagnosis not present

## 2021-08-31 DIAGNOSIS — I5033 Acute on chronic diastolic (congestive) heart failure: Secondary | ICD-10-CM | POA: Diagnosis not present

## 2021-08-31 DIAGNOSIS — E1122 Type 2 diabetes mellitus with diabetic chronic kidney disease: Secondary | ICD-10-CM | POA: Diagnosis not present

## 2021-08-31 NOTE — Telephone Encounter (Signed)
Left message for Sherlynn Stalls asking if patient qualifies for PT. [place] for [facility] Nurse to [employer].

## 2021-09-01 DIAGNOSIS — E1122 Type 2 diabetes mellitus with diabetic chronic kidney disease: Secondary | ICD-10-CM | POA: Diagnosis not present

## 2021-09-01 DIAGNOSIS — I5033 Acute on chronic diastolic (congestive) heart failure: Secondary | ICD-10-CM | POA: Diagnosis not present

## 2021-09-01 DIAGNOSIS — I110 Hypertensive heart disease with heart failure: Secondary | ICD-10-CM | POA: Diagnosis not present

## 2021-09-01 DIAGNOSIS — N184 Chronic kidney disease, stage 4 (severe): Secondary | ICD-10-CM | POA: Diagnosis not present

## 2021-09-01 DIAGNOSIS — E1151 Type 2 diabetes mellitus with diabetic peripheral angiopathy without gangrene: Secondary | ICD-10-CM | POA: Diagnosis not present

## 2021-09-01 DIAGNOSIS — Z48812 Encounter for surgical aftercare following surgery on the circulatory system: Secondary | ICD-10-CM | POA: Diagnosis not present

## 2021-09-01 NOTE — Telephone Encounter (Signed)
[place] called, left VM to return the call to the office for a message from the provider on this patient and PT services.

## 2021-09-01 NOTE — Telephone Encounter (Signed)
LMOV to schedule ECHO sooner if wanted and to schedule follow up

## 2021-09-01 NOTE — Telephone Encounter (Addendum)
[place] called and spoke to [place], Publishing copy about the message below. She says the patient started PT and Nursing last week. Noted it in an encounter on 08/24/21.

## 2021-09-02 ENCOUNTER — Ambulatory Visit (INDEPENDENT_AMBULATORY_CARE_PROVIDER_SITE_OTHER): Payer: Medicare Other | Admitting: Family Medicine

## 2021-09-02 ENCOUNTER — Encounter: Payer: Self-pay | Admitting: Family Medicine

## 2021-09-02 VITALS — BP 139/65 | HR 66 | Temp 97.5°F | Resp 16 | Wt 226.0 lb

## 2021-09-02 DIAGNOSIS — Z48812 Encounter for surgical aftercare following surgery on the circulatory system: Secondary | ICD-10-CM | POA: Diagnosis not present

## 2021-09-02 DIAGNOSIS — E1151 Type 2 diabetes mellitus with diabetic peripheral angiopathy without gangrene: Secondary | ICD-10-CM | POA: Diagnosis not present

## 2021-09-02 DIAGNOSIS — Z09 Encounter for follow-up examination after completed treatment for conditions other than malignant neoplasm: Secondary | ICD-10-CM | POA: Diagnosis not present

## 2021-09-02 DIAGNOSIS — I509 Heart failure, unspecified: Secondary | ICD-10-CM | POA: Insufficient documentation

## 2021-09-02 DIAGNOSIS — I152 Hypertension secondary to endocrine disorders: Secondary | ICD-10-CM

## 2021-09-02 DIAGNOSIS — E1129 Type 2 diabetes mellitus with other diabetic kidney complication: Secondary | ICD-10-CM

## 2021-09-02 DIAGNOSIS — I110 Hypertensive heart disease with heart failure: Secondary | ICD-10-CM | POA: Diagnosis not present

## 2021-09-02 DIAGNOSIS — N184 Chronic kidney disease, stage 4 (severe): Secondary | ICD-10-CM | POA: Diagnosis not present

## 2021-09-02 DIAGNOSIS — I5033 Acute on chronic diastolic (congestive) heart failure: Secondary | ICD-10-CM | POA: Diagnosis not present

## 2021-09-02 DIAGNOSIS — E1159 Type 2 diabetes mellitus with other circulatory complications: Secondary | ICD-10-CM | POA: Diagnosis not present

## 2021-09-02 DIAGNOSIS — E1122 Type 2 diabetes mellitus with diabetic chronic kidney disease: Secondary | ICD-10-CM | POA: Diagnosis not present

## 2021-09-02 NOTE — Assessment & Plan Note (Signed)
Chronic, stable Repeat CBC

## 2021-09-02 NOTE — Assessment & Plan Note (Signed)
Chronic, stable Repeats 140s FBG with use of CGM HH to assist Review of orders/notes scanned Still drinking zero tea; encouraged less tea more water

## 2021-09-02 NOTE — Progress Notes (Signed)
I,Roshena L Chambers,acting as a scribe for Gwyneth Sprout, FNP.,have documented all relevant documentation on the behalf of Gwyneth Sprout, FNP,as directed by  Gwyneth Sprout, FNP while in the presence of Gwyneth Sprout, FNP.    Established patient visit   Patient: Jocelyn Wells   DOB: Apr 19, 1955   66 y.o. Female  MRN: [id number] Visit Date: 09/02/2021  Today's healthcare provider: Gwyneth Sprout, FNP  Re Introduced to nurse practitioner role and practice setting.  All questions answered.  Discussed provider/patient relationship and expectations.   Chief Complaint  Patient presents with   Hospitalization Follow-up   Subjective    HPI  Follow up Hospitalization  Patient was admitted to [facility] on 08/12/2021 and discharged on 08/23/2021. She was treated for Acute renal failure superimposed on stage 4 chronic kidney disease . Recommendations at discharge:    Follow-up with PCP in 1 week. Follow-up  GI in 2 weeks. Follow-up with nephrology in 1 week.  Telephone follow up was not done. She reports good compliance with treatment. She reports this condition is improved.  -----------------------------------------------------------------------------------------   Medications: Outpatient Medications Prior to Visit  Medication Sig   albuterol (VENTOLIN HFA) 108 (90 Base) MCG/ACT inhaler TAKE 2 PUFFS BY MOUTH EVERY 6 HOURS AS NEEDED FOR WHEEZE OR SHORTNESS OF BREATH   apixaban (ELIQUIS) 5 MG TABS tablet Take 1 tablet (5 mg total) by mouth 2 (two) times daily.   atorvastatin (LIPITOR) 40 MG tablet TAKE 1 TABLET BY MOUTH EVERY DAY   carvedilol (COREG) 12.5 MG tablet Take 1 tablet (12.5 mg total) by mouth 2 (two) times daily with a meal.   clopidogrel (PLAVIX) 75 MG tablet Take 1 tablet (75 mg total) by mouth daily.   Dulaglutide 3 MG/0.5ML SOPN Inject into the skin once a week.   fluticasone (FLONASE) 50 MCG/ACT nasal spray Place 2 sprays into both nostrils daily.   gabapentin  (NEURONTIN) 300 MG capsule Take 300 mg by mouth 2 (two) times daily.   glucose blood test strip OneTouch Verio strips   insulin aspart (NOVOLOG) 100 UNIT/ML injection Inject up to 100 units daily in insulin pump   Insulin Disposable Pump (OMNIPOD DASH PODS, GEN 4,) MISC USE 1 POD EACH EVERY 72    HOURS   Insulin Human (INSULIN PUMP) SOLN Per endocrinoloy   loratadine (CLARITIN) 10 MG tablet Take 1 tablet by mouth daily.   pantoprazole (PROTONIX) 40 MG tablet Take 1 tablet (40 mg total) by mouth 2 (two) times daily.   PARoxetine (PAXIL) 40 MG tablet Take 1 tablet (40 mg total) by mouth daily.   silver sulfADIAZINE (SILVADENE) 1 % cream Apply 1 Application topically daily.   Spacer/Aero-Holding [facility] DIAMOND) MISC Use with inhaler to ensure medication is delivered throughout lungs   torsemide (DEMADEX) 20 MG tablet Take 1 tablet (20 mg total) by mouth every Monday, Wednesday, and Friday.   Vitamin D, Ergocalciferol, (DRISDOL) 1.25 MG (50000 UT) CAPS capsule Take 50,000 Units by mouth once a week. (Sundays)   No facility-administered medications prior to visit.    Review of Systems  Constitutional:  Negative for appetite change, chills, fatigue and fever.  Respiratory:  Negative for chest tightness and shortness of breath.   Cardiovascular:  Negative for chest pain and palpitations.  Gastrointestinal:  Negative for abdominal pain, nausea and vomiting.  Neurological:  Negative for dizziness and weakness.     Objective    BP 139/65 (BP Location: Right Arm, Patient  Position: Sitting, Cuff Size: Large)   Pulse 66   Temp (!) 97.5 F (36.4 C) (Oral)   Resp 16   Wt 226 lb (102.5 kg)   SpO2 96% Comment: room air  BMI 36.48 kg/m    Physical Exam Vitals and nursing note reviewed.  Constitutional:      General: She is not in acute distress.    Appearance: Normal appearance. She is obese. She is not ill-appearing, toxic-appearing or diaphoretic.  HENT:     Head:  Normocephalic and atraumatic.     Ears:     Comments: HOH bilateral  Cardiovascular:     Rate and Rhythm: Normal rate and regular rhythm.     Pulses: Normal pulses.     Heart sounds: Normal heart sounds. No murmur heard.    No friction rub. No gallop.  Pulmonary:     Effort: Pulmonary effort is normal. No respiratory distress.     Breath sounds: Normal breath sounds. No stridor. No wheezing, rhonchi or rales.  Chest:     Chest wall: No tenderness.  Abdominal:     General: Bowel sounds are normal.     Palpations: Abdomen is soft.  Musculoskeletal:        General: Swelling present. No tenderness, deformity or signs of injury. Normal range of motion.     Right lower leg: 3+ Pitting Edema present.     Left lower leg: 2+ Pitting Edema present.  Skin:    General: Skin is warm and dry.     Capillary Refill: Capillary refill takes less than 2 seconds.     Coloration: Skin is not jaundiced or pale.     Findings: Erythema, lesion and rash present. No bruising. Rash is scaling.          Comments: Wrapped DM ulcer to mid L lower leg by [facility]  Neurological:     General: No focal deficit present.     Mental Status: She is alert and oriented to person, place, and time. Mental status is at baseline.     Cranial Nerves: No cranial nerve deficit.     Sensory: No sensory deficit.     Motor: No weakness.     Coordination: Coordination normal.  Psychiatric:        Mood and Affect: Mood normal.        Behavior: Behavior normal.        Thought Content: Thought content normal.        Judgment: Judgment normal.       No results found for any visits on 09/02/21.  Assessment & Plan     Problem List Items Addressed This Visit       Cardiovascular and Mediastinum   Heart failure (HCC)    Chronic, stable Repeat CBC      Relevant Orders   Comprehensive Metabolic Panel (CMET)   CBC with Differential/Platelet   Hypertension associated with diabetes (HCC)    Chronic, improved Repeat  CMP Continue 12.5 mg BID coreg      Relevant Orders   Comprehensive Metabolic Panel (CMET)   CBC with Differential/Platelet     Endocrine   Type 2 diabetes mellitus with other diabetic kidney complication (HCC)    Chronic, stable Repeats 140s FBG with use of CGM HH to assist Review of orders/notes scanned Still drinking zero tea; encouraged less tea more water      Relevant Orders   Comprehensive Metabolic Panel (CMET)   CBC with Differential/Platelet  Other   Hospital discharge follow-up - Primary   Relevant Orders   Comprehensive Metabolic Panel (CMET)   CBC with Differential/Platelet     Return in about 3 months (around 12/03/2021) for chonic disease management.      Vonna Kotyk, FNP, have reviewed all documentation for this visit. The documentation on 09/02/21 for the exam, diagnosis, procedures, and orders are all accurate and complete.    Gwyneth Sprout, [place] [phone] (phone) [phone] (fax)  [place]

## 2021-09-02 NOTE — Assessment & Plan Note (Signed)
Chronic, improved Repeat CMP Continue 12.5 mg BID coreg

## 2021-09-03 ENCOUNTER — Telehealth: Payer: Self-pay | Admitting: Family Medicine

## 2021-09-03 ENCOUNTER — Telehealth: Payer: Self-pay | Admitting: *Deleted

## 2021-09-03 ENCOUNTER — Telehealth: Payer: Medicare Other

## 2021-09-03 ENCOUNTER — Other Ambulatory Visit: Payer: Self-pay | Admitting: Family Medicine

## 2021-09-03 DIAGNOSIS — N184 Chronic kidney disease, stage 4 (severe): Secondary | ICD-10-CM

## 2021-09-03 DIAGNOSIS — I5033 Acute on chronic diastolic (congestive) heart failure: Secondary | ICD-10-CM | POA: Diagnosis not present

## 2021-09-03 DIAGNOSIS — E1122 Type 2 diabetes mellitus with diabetic chronic kidney disease: Secondary | ICD-10-CM | POA: Diagnosis not present

## 2021-09-03 DIAGNOSIS — Z48812 Encounter for surgical aftercare following surgery on the circulatory system: Secondary | ICD-10-CM | POA: Diagnosis not present

## 2021-09-03 DIAGNOSIS — I110 Hypertensive heart disease with heart failure: Secondary | ICD-10-CM | POA: Diagnosis not present

## 2021-09-03 DIAGNOSIS — E1151 Type 2 diabetes mellitus with diabetic peripheral angiopathy without gangrene: Secondary | ICD-10-CM | POA: Diagnosis not present

## 2021-09-03 LAB — CBC WITH DIFFERENTIAL/PLATELET
Basophils Absolute: 0.1 10*3/uL (ref 0.0–0.2)
Basos: 1 %
EOS (ABSOLUTE): 1.1 10*3/uL — ABNORMAL HIGH (ref 0.0–0.4)
Eos: 8 %
Hematocrit: 35.8 % (ref 34.0–46.6)
Hemoglobin: 10.8 g/dL — ABNORMAL LOW (ref 11.1–15.9)
Immature Grans (Abs): 0 10*3/uL (ref 0.0–0.1)
Immature Granulocytes: 0 %
Lymphocytes Absolute: 1.4 10*3/uL (ref 0.7–3.1)
Lymphs: 11 %
MCH: 25.4 pg — ABNORMAL LOW (ref 26.6–33.0)
MCHC: 30.2 g/dL — ABNORMAL LOW (ref 31.5–35.7)
MCV: 84 fL (ref 79–97)
Monocytes Absolute: 0.8 10*3/uL (ref 0.1–0.9)
Monocytes: 6 %
Neutrophils Absolute: 9.4 10*3/uL — ABNORMAL HIGH (ref 1.4–7.0)
Neutrophils: 74 %
Platelets: 499 10*3/uL — ABNORMAL HIGH (ref 150–450)
RBC: 4.25 x10E6/uL (ref 3.77–5.28)
RDW: 15.8 % — ABNORMAL HIGH (ref 11.7–15.4)
WBC: 12.8 10*3/uL — ABNORMAL HIGH (ref 3.4–10.8)

## 2021-09-03 LAB — COMPREHENSIVE METABOLIC PANEL
ALT: 12 IU/L (ref 0–32)
AST: 11 IU/L (ref 0–40)
Albumin/Globulin Ratio: 1.4 (ref 1.2–2.2)
Albumin: 3.8 g/dL — ABNORMAL LOW (ref 3.9–4.9)
Alkaline Phosphatase: 62 IU/L (ref 44–121)
BUN/Creatinine Ratio: 31 — ABNORMAL HIGH (ref 12–28)
BUN: 104 mg/dL (ref 8–27)
Bilirubin Total: 0.2 mg/dL (ref 0.0–1.2)
CO2: 18 mmol/L — ABNORMAL LOW (ref 20–29)
Calcium: 9.1 mg/dL (ref 8.7–10.3)
Chloride: 105 mmol/L (ref 96–106)
Creatinine, Ser: 3.35 mg/dL — ABNORMAL HIGH (ref 0.57–1.00)
Globulin, Total: 2.8 g/dL (ref 1.5–4.5)
Glucose: 248 mg/dL — ABNORMAL HIGH (ref 70–99)
Potassium: 4.7 mmol/L (ref 3.5–5.2)
Sodium: 141 mmol/L (ref 134–144)
Total Protein: 6.6 g/dL (ref 6.0–8.5)
eGFR: 14 mL/min/{1.73_m2} — ABNORMAL LOW (ref >59)

## 2021-09-03 NOTE — Chronic Care Management (AMB) (Signed)
  Chronic Care Management   Note  09/03/2021 Name: Jocelyn Wells MRN: [id number] DOB: 1955-04-23  Jocelyn Wells is a 66 y.o. year old female who is a primary care patient of Gwyneth Sprout, FNP. I reached out to Jocelyn Wells by phone today in response to a referral sent by Jocelyn Wells PCP.  Jocelyn Wells was given information about Chronic Care Management services today including:  CCM service includes personalized support from designated clinical staff supervised by her physician, including individualized plan of care and coordination with other care providers 24/7 contact phone numbers for assistance for urgent and routine care needs. Service will only be billed when office clinical staff spend 20 minutes or more in a month to coordinate care. Only one practitioner may furnish and bill the service in a calendar month. The patient may stop CCM services at any time (effective at the end of the month) by phone call to the office staff. The patient is responsible for co-pay (up to 20% after annual deductible is met) if co-pay is required by the individual health plan.   Patient agreed to services and verbal consent obtained.   Follow up plan: Telephone appointment with care management team member scheduled for: 09/08/2021  Julian Hy, [place] Direct Dial: [phone]

## 2021-09-03 NOTE — Telephone Encounter (Signed)
Jocelyn Wells from [employer]: [phone] Blood Sugar: 276 she has just taken her insulin, about 20 minutes ago

## 2021-09-03 NOTE — Progress Notes (Signed)
Referral placed for additional CCM, Pharmacist, assistance with CKD, Chronic Kidney Disease.  Kidney function has increased since d/c from hospital. Creatinine is now elevated from 2.56-3.35. eGFR back at 14. BUN is also very elevated.  White count elevation and anemia remain. Likely healing leg wound as source of infection. Continue to follow up with home health.

## 2021-09-04 DIAGNOSIS — I110 Hypertensive heart disease with heart failure: Secondary | ICD-10-CM | POA: Diagnosis not present

## 2021-09-04 DIAGNOSIS — I5033 Acute on chronic diastolic (congestive) heart failure: Secondary | ICD-10-CM | POA: Diagnosis not present

## 2021-09-04 DIAGNOSIS — E1151 Type 2 diabetes mellitus with diabetic peripheral angiopathy without gangrene: Secondary | ICD-10-CM | POA: Diagnosis not present

## 2021-09-04 DIAGNOSIS — E1122 Type 2 diabetes mellitus with diabetic chronic kidney disease: Secondary | ICD-10-CM | POA: Diagnosis not present

## 2021-09-04 DIAGNOSIS — Z48812 Encounter for surgical aftercare following surgery on the circulatory system: Secondary | ICD-10-CM | POA: Diagnosis not present

## 2021-09-04 DIAGNOSIS — N184 Chronic kidney disease, stage 4 (severe): Secondary | ICD-10-CM | POA: Diagnosis not present

## 2021-09-04 NOTE — Telephone Encounter (Signed)
I returned Jocelyn Wells's call. Jocelyn Wells states that she didn't need a call back. She was calling us yesterday because her job requires that they notify the patient's provider if the blood sugar is high. Per Jocelyn Wells, patients blood sugar was high because she didn't take her insulin. Jocelyn Wells states that yesterday while she was with the patient, she administered her insulin and remained with the patient for a while. Her her blood sugar levels started to come down after insulin administration.

## 2021-09-07 ENCOUNTER — Telehealth: Payer: Self-pay

## 2021-09-07 DIAGNOSIS — Z48812 Encounter for surgical aftercare following surgery on the circulatory system: Secondary | ICD-10-CM | POA: Diagnosis not present

## 2021-09-07 DIAGNOSIS — E1151 Type 2 diabetes mellitus with diabetic peripheral angiopathy without gangrene: Secondary | ICD-10-CM | POA: Diagnosis not present

## 2021-09-07 DIAGNOSIS — I110 Hypertensive heart disease with heart failure: Secondary | ICD-10-CM | POA: Diagnosis not present

## 2021-09-07 DIAGNOSIS — I5033 Acute on chronic diastolic (congestive) heart failure: Secondary | ICD-10-CM | POA: Diagnosis not present

## 2021-09-07 DIAGNOSIS — E1122 Type 2 diabetes mellitus with diabetic chronic kidney disease: Secondary | ICD-10-CM | POA: Diagnosis not present

## 2021-09-07 DIAGNOSIS — N184 Chronic kidney disease, stage 4 (severe): Secondary | ICD-10-CM | POA: Diagnosis not present

## 2021-09-07 NOTE — Progress Notes (Signed)
Chronic Care Management Pharmacy Assistant   Name: Jocelyn Wells  MRN: [id number] DOB: 05-Jan-1956  Chart review for the clinical pharmacist on 09/08/2021 at 9:00 am.   Conditions to be addressed/monitored: CHF, HTN, HLD, COPD, DMII, CKD Stage 4, Anxiety, Depression, Osteoporosis, and Embolic Stroke, DVT, Aortic atherosclerosis , Arthritis, Anemia in Chronic kidney disease, Cephalagia, Vitamin D deficiency  Primary concerns for visit include: None ID   Recent office visits:  09/02/2021 Tally Joe FNP (PCP) No Medication Changes noted, Return in about 3 months 07/14/2021 Tally Joe FNP (PCP) No Medication Changes noted,  Ambulatory referral to Cardiology, Ambulatory referral to Pulmonology, Return in about 3 months  05/27/2021 Kirke Shaggy LPN (PCP Office) Medicare wellness completed 05/04/2021 Neldon Labella RN (CCM) No medication Changes noted 04/13/2021 Dr. Brita Romp MD (PCP) No medication Changes noted,   Return in about 2 months  04/10/2021 Neldon Labella RN (CCM) No medication Changes noted 04/09/2021 Neldon Labella RN (CCM) No medication Changes noted 03/26/2021 Neldon Labella RN (CCM) No medication Changes noted 03/20/2021 Neldon Labella RN (CCM) No medication Changes noted 03/16/2021 Neldon Labella RN (CCM) No medication Changes noted  Recent consult visits:  08/27/2021 Dr. Lanora Manis MD (Nephrology) No Medication Changes noted 08/05/2021 Eulogio Ditch NP (Vascular Surgery) Start sulfamethoxazole- trimethoprim 800-160 MG tablet 07/30/2021 Dr. Lanora Manis MD (Nephrology) No Medication Changes noted 07/17/2021 Dr. Garen Lah MD (Cardiology) Start torsemide 20 mg daily, Stop HCTZ, change Losartan to 50 mg daily, Change Carvedilol to 12.5 mg twice daily 07/09/2021 Dr. Modesta Messing MD ([place]) Unable to see note 07/01/2021 Eulogio Ditch NP (Vascular Surgery) Start Cefdinir 300 mg 2 times daily 06/01/2021 Dr. Gabriel Carina MD (Endocrinology) Adjusted her pump settings Basal  rate 1.5 units/hr 24-hr basal is 36 units, Follow up in 3 months 05/28/2021 Dr. Lanora Manis MD (Nephrology)No medication Changes noted, Return in 2 months 05/04/2021 Sharlotte Alamo DPM (Podiatry) No Medication Changes noted, return in 3 months 04/29/2021 Dr. Lanora Manis MD (Nephrology) No medication Changes noted, Return in 4 weeks 04/17/2021 Dr. Gabriel Carina MD (Endocrinology) Increase Trulicity to 3 mg weekly, follow up in 6 weeks  04/15/2021 Dr. Holley Raring MD (Pain Medicine) No Medication Changes noted 04/13/2021 Dr. Modesta Messing MD ([place]) Unable to see note 03/25/2021 Sherlynn Stalls (Ophthalmology) Unable to see note 03/18/2021 Dr. Lanora Manis MD (Nephrology)No Medication Changes noted, Return in about 4 weeks   Hospital visits:  Medication Reconciliation was completed by comparing discharge summary, patient's EMR and Pharmacy list, and upon discussion with patient.  Admitted to the hospital on 08/12/2021 due to Acute renal failure. Discharge date was 08/23/2021. Discharged from [place]?Medications Started at [facility] Discharge:?? -started Clopidogrel 75 mg daily  Medication Changes at Hospital Discharge: -Changed torsemide to 20 mg Monday Wednesday and Friday  Medications Discontinued at Hospital Discharge: - Stopped denosumab - Stopped  hydrochlorothiazide - Stopped losartan  - Stopped ondansetron - Stopped sulfamethoxazole-trimethoprim  Medications that remain the same after Hospital Discharge:??  -All other medications will remain the same.    Questions for Clinical Pharmacist:   Left Voice message to do initial question prior to patient appointment on 09/08/2021 for CCM at 9:00 am with Junius Argyle the Clinical pharmacist.    Medications: Outpatient Encounter Medications as of 09/07/2021  Medication Sig   albuterol (VENTOLIN HFA) 108 (90 Base) MCG/ACT inhaler TAKE 2 PUFFS BY MOUTH EVERY 6 HOURS AS NEEDED FOR WHEEZE OR SHORTNESS OF BREATH   apixaban  (ELIQUIS) 5 MG TABS tablet Take 1 tablet (5 mg total) by  mouth 2 (two) times daily.   atorvastatin (LIPITOR) 40 MG tablet TAKE 1 TABLET BY MOUTH EVERY DAY   carvedilol (COREG) 12.5 MG tablet Take 1 tablet (12.5 mg total) by mouth 2 (two) times daily with a meal.   clopidogrel (PLAVIX) 75 MG tablet Take 1 tablet (75 mg total) by mouth daily.   Dulaglutide 3 MG/0.5ML SOPN Inject into the skin once a week.   fluticasone (FLONASE) 50 MCG/ACT nasal spray Place 2 sprays into both nostrils daily.   gabapentin (NEURONTIN) 300 MG capsule Take 300 mg by mouth 2 (two) times daily.   glucose blood test strip OneTouch Verio strips   insulin aspart (NOVOLOG) 100 UNIT/ML injection Inject up to 100 units daily in insulin pump   Insulin Disposable Pump (OMNIPOD DASH PODS, GEN 4,) MISC USE 1 POD EACH EVERY 72    HOURS   Insulin Human (INSULIN PUMP) SOLN Per endocrinoloy   loratadine (CLARITIN) 10 MG tablet Take 1 tablet by mouth daily.   pantoprazole (PROTONIX) 40 MG tablet Take 1 tablet (40 mg total) by mouth 2 (two) times daily.   PARoxetine (PAXIL) 40 MG tablet Take 1 tablet (40 mg total) by mouth daily.   silver sulfADIAZINE (SILVADENE) 1 % cream Apply 1 Application topically daily.   Spacer/Aero-Holding Chambers [facility] DIAMOND) MISC Use with inhaler to ensure medication is delivered throughout lungs   torsemide (DEMADEX) 20 MG tablet Take 1 tablet (20 mg total) by mouth every Monday, Wednesday, and Friday.   Vitamin D, Ergocalciferol, (DRISDOL) 1.25 MG (50000 UT) CAPS capsule Take 50,000 Units by mouth once a week. (Sundays)   No facility-administered encounter medications on file as of 09/07/2021.    Care Gaps: Tetanus Vaccine Pneumonia Vaccine Urine Microalbumin COVID-19 Vaccine Foot Exam  Ophthalmology Exam A1C 11.7% on 08/20/2021  Star Rating Drugs: Atorvastatin 40 mg last filled 05/09/2020 for [facility] day supply at [facility]. Trulicity 3 mg last filled 04/18/2021 for 84 day supply at [facility].  Medication Fill Gaps: None ID  Anderson [place] Clinical Pharmacist Assistant [phone]

## 2021-09-08 ENCOUNTER — Ambulatory Visit (INDEPENDENT_AMBULATORY_CARE_PROVIDER_SITE_OTHER): Payer: Medicare Other

## 2021-09-08 DIAGNOSIS — I152 Hypertension secondary to endocrine disorders: Secondary | ICD-10-CM

## 2021-09-08 DIAGNOSIS — E785 Hyperlipidemia, unspecified: Secondary | ICD-10-CM

## 2021-09-08 DIAGNOSIS — I700 Atherosclerosis of aorta: Secondary | ICD-10-CM

## 2021-09-08 DIAGNOSIS — J449 Chronic obstructive pulmonary disease, unspecified: Secondary | ICD-10-CM

## 2021-09-08 DIAGNOSIS — I509 Heart failure, unspecified: Secondary | ICD-10-CM

## 2021-09-08 DIAGNOSIS — N184 Chronic kidney disease, stage 4 (severe): Secondary | ICD-10-CM

## 2021-09-08 DIAGNOSIS — Z794 Long term (current) use of insulin: Secondary | ICD-10-CM

## 2021-09-08 DIAGNOSIS — E1129 Type 2 diabetes mellitus with other diabetic kidney complication: Secondary | ICD-10-CM

## 2021-09-08 MED ORDER — ATORVASTATIN CALCIUM 40 MG PO TABS: 40.0000 mg | ORAL_TABLET | Freq: Every day | ORAL | 3 refills | Status: DC

## 2021-09-08 NOTE — Progress Notes (Signed)
Chronic Care Management Pharmacy Note  10/07/2021 Name:  TATYM SCHERMER MRN:  [id number] DOB:  1955-09-29  Summary: Patient presents for initial CCM consult.  Recommendations/Changes made from today's visit: Continue current medications  Plan: CPP follow-up 1 month   Subjective: ZEFFIE BICKERT is an 66 y.o. year old female who is a primary patient of Gwyneth Sprout, FNP.  The CCM team was consulted for assistance with disease management and care coordination needs.    Engaged with patient by telephone for initial visit in response to provider referral for pharmacy case management and/or care coordination services.   Consent to Services:  The patient was given the following information about Chronic Care Management services today, agreed to services, and gave verbal consent: 1. CCM service includes personalized support from designated clinical staff supervised by the primary care provider, including individualized plan of care and coordination with other care providers 2. 24/7 contact phone numbers for assistance for urgent and routine care needs. 3. Service will only be billed when office clinical staff spend 20 minutes or more in a month to coordinate care. 4. Only one practitioner may furnish and bill the service in a calendar month. 5.The patient may stop CCM services at any time (effective at the end of the month) by phone call to the office staff. 6. The patient will be responsible for cost sharing (co-pay) of up to 20% of the service fee (after annual deductible is met). Patient agreed to services and consent obtained.  Patient Care Team: Gwyneth Sprout, FNP as PCP - General (Family Medicine) Gabriel Carina, Betsey Holiday, MD as Physician Assistant (Endocrinology) Deboraha Sprang, MD as Consulting Physician (Cardiology) Rainey Pines, MD as Referring Physician (Psychiatry) Ralene Bathe, MD as Consulting Physician (Dermatology) Manya Silvas, MD (Inactive) as Consulting Physician  (Gastroenterology) Dimmig, Marcello Moores, MD as Referring Physician (Orthopedic Surgery) Schnier, Dolores Lory, MD (Vascular Surgery) Lynnell Dike, OD (Optometry) Jannifer Franklin, NP as Nurse Practitioner (Neurology) Germaine Pomfret, [facility] (Pharmacist)  Recent office visits: 09/02/2021 Tally Joe FNP (PCP) No Medication Changes noted, Return in about 3 months 07/14/2021 Tally Joe FNP (PCP) No Medication Changes noted,  Ambulatory referral to Cardiology, Ambulatory referral to Pulmonology, Return in about 3 months  05/27/2021 Kirke Shaggy LPN (PCP Office) Medicare wellness completed 04/13/2021 Dr. Brita Romp MD (PCP) No medication Changes noted, Return in about 2 months   Recent consult visits: 08/27/2021 Dr. Lanora Manis MD (Nephrology) No Medication Changes noted 08/05/2021 Eulogio Ditch NP (Vascular Surgery) Start sulfamethoxazole- trimethoprim 800-160 MG tablet 07/30/2021 Dr. Lanora Manis MD (Nephrology) No Medication Changes noted 07/17/2021 Dr. Garen Lah MD (Cardiology) Start torsemide 20 mg daily, Stop HCTZ, change Losartan to 50 mg daily, Change Carvedilol to 12.5 mg twice daily 07/09/2021 Dr. Modesta Messing MD ([place]) Unable to see note 07/01/2021 Eulogio Ditch NP (Vascular Surgery) Start Cefdinir 300 mg 2 times daily 06/01/2021 Dr. Gabriel Carina MD (Endocrinology) Adjusted her pump settings Basal rate 1.5 units/hr 24-hr basal is 36 units, Follow up in 3 months 05/28/2021 Dr. Lanora Manis MD (Nephrology)No medication Changes noted, Return in 2 months 04/29/2021 Dr. Lanora Manis MD (Nephrology) No medication Changes noted, Return in 4 weeks 04/17/2021 Dr. Gabriel Carina MD (Endocrinology) Increase Trulicity to 3 mg weekly, follow up in 6 weeks  04/15/2021 Dr. Holley Raring MD (Pain Medicine) No Medication Changes noted 04/13/2021 Dr. Modesta Messing MD ([place]) Unable to see note 03/25/2021 Sherlynn Stalls (Ophthalmology) Unable to see note 03/18/2021 Dr. Lanora Manis MD (Nephrology)No Medication Changes noted, Return  in about 4 weeks  Hospital visits: Admitted to the hospital on 08/12/2021 due to Acute renal failure. Discharge date was 08/23/2021. Discharged from [facility].    Objective:  Lab Results  Component Value Date   CREATININE 3.35 (H) 09/02/2021   BUN 104 (HH) 09/02/2021   EGFR 14 (L) 09/02/2021   GFRNONAA 20 (L) 08/23/2021   GFRAA 33 (L) 04/23/2019   NA 141 09/02/2021   K 4.7 09/02/2021   CALCIUM 9.1 09/02/2021   CO2 18 (L) 09/02/2021   GLUCOSE 248 (H) 09/02/2021    Lab Results  Component Value Date/Time   HGBA1C 7.9 (A) 07/14/2021 03:44 PM   HGBA1C 10.3 09/02/2020 12:00 AM   HGBA1C 10.2 (H) 05/31/2020 05:32 AM   HGBA1C 11.4 03/28/2019 12:00 AM   MICROALBUR 34 05/19/2016 12:00 AM    Last diabetic Eye exam:  Lab Results  Component Value Date/Time   HMDIABEYEEXA Retinopathy (A) 07/15/2020 02:18 PM    Last diabetic Foot exam: No results found for: "HMDIABFOOTEX"   Lab Results  Component Value Date   CHOL 271 (H) 10/20/2020   HDL 41 10/20/2020   LDLCALC 186 (H) 10/20/2020   TRIG 231 (H) 10/20/2020   CHOLHDL 6.6 (H) 10/20/2020       Latest Ref Rng & Units 09/02/2021    2:08 PM 08/20/2021    4:17 AM 08/19/2021    8:49 AM  Hepatic Function  Total Protein 6.0 - 8.5 g/dL 6.6     Albumin 3.9 - 4.9 g/dL 3.8  3.1  3.0   AST 0 - 40 IU/L 11     ALT 0 - 32 IU/L 12     Alk Phosphatase 44 - 121 IU/L 62     Total Bilirubin 0.0 - 1.2 mg/dL 0.2       Lab Results  Component Value Date/Time   TSH 2.977 10/06/2021 12:38 PM       Latest Ref Rng & Units 09/02/2021    2:08 PM 08/23/2021    4:08 AM 08/22/2021    4:40 AM  CBC  WBC 3.4 - 10.8 x10E3/uL 12.8  13.0  13.5   Hemoglobin 11.1 - 15.9 g/dL 10.8  11.6  11.3   Hematocrit 34.0 - 46.6 % 35.8  39.3  38.1   Platelets 150 - 450 x10E3/uL 499  534  494     No results found for: "VD25OH"  Clinical ASCVD: Yes  The ASCVD Risk score (Arnett DK, et al., 2019) failed to calculate for the following  reasons:   The patient has a prior MI or stroke diagnosis       09/02/2021    1:49 PM 07/14/2021    2:04 PM 05/27/2021    2:03 PM  Depression screen PHQ 2/9  Decreased Interest 0 0 0  Down, Depressed, Hopeless 0 1 0  PHQ - 2 Score 0 1 0  Altered sleeping 0 1 1  Tired, decreased energy _0 Change in appetite 0 3 1  Feeling bad or failure about yourself  0 1   Trouble concentrating 0 0 0  Moving slowly or fidgety/restless 0 3 0  Suicidal thoughts 0 0 0  PHQ-9 Score _1 Difficult doing work/chores Not difficult at all Extremely dIfficult Not difficult at all    Social History   Tobacco Use  Smoking Status Former   Packs/day: 2.00   Years: 36.00   Total pack years: 72.00   Types: Cigarettes   Start date: 02/08/1974  Quit date: 03/11/2021   Years since quitting: 0.5  Smokeless Tobacco Never  Tobacco Comments   Quit 03/2021   BP Readings from Last 3 Encounters:  10/06/21 124/80  09/10/21 (!) 143/77  09/02/21 139/65   Pulse Readings from Last 3 Encounters:  10/06/21 74  09/10/21 77  09/02/21 66   Wt Readings from Last 3 Encounters:  10/06/21 230 lb 9.6 oz (104.6 kg)  09/10/21 224 lb (101.6 kg)  09/02/21 226 lb (102.5 kg)   BMI Readings from Last 3 Encounters:  10/06/21 37.22 kg/m  09/10/21 36.15 kg/m  09/02/21 36.48 kg/m    Assessment/Interventions: Review of patient past medical history, allergies, medications, health status, including review of consultants reports, laboratory and other test data, was performed as part of comprehensive evaluation and provision of chronic care management services.   SDOH:  (Social Determinants of Health) assessments and interventions performed: Yes SDOH Interventions    Flowsheet Row Most Recent Value  SDOH Interventions   Financial Strain Interventions Intervention Not Indicated  Transportation Interventions Intervention Not Indicated      SDOH Screenings   Alcohol Screen: Low Risk  (05/27/2021)   Alcohol Screen     Last Alcohol Screening Score (AUDIT): 0  Depression (PHQ2-9): Low Risk  (09/02/2021)   Depression (PHQ2-9)    PHQ-2 Score: 1  Recent Concern: Depression (PHQ2-9) - Medium Risk (07/14/2021)   Depression (PHQ2-9)    PHQ-2 Score: 12  Financial Resource Strain: Low Risk  (10/07/2021)   Overall Financial Resource Strain (CARDIA)    Difficulty of Paying Living Expenses: Not hard at all  Food Insecurity: No Food Insecurity (05/27/2021)   Hunger Vital Sign    Worried About Running Out of Food in the Last Year: Never true    Ran Out of Food in the Last Year: Never true  Housing: Low Risk  (05/27/2021)   Housing    Last Housing Risk Score: 0  Physical Activity: Inactive (05/27/2021)   Exercise Vital Sign    Days of Exercise per Week: 0 days    Minutes of Exercise per Session: 0 min  Social Connections: Moderately Isolated (05/27/2021)   Social Connection and Isolation Panel [NHANES]    Frequency of Communication with Friends and Family: More than three times a week    Frequency of Social Gatherings with Friends and Family: Once a week    Attends Religious Services: Never    Marine scientist or Organizations: No    Attends Archivist Meetings: Never    Marital Status: Married  Stress: No Stress Concern Present (05/27/2021)   [employer]    Feeling of Stress : Not at all  Tobacco Use: Medium Risk (10/06/2021)   Patient History    Smoking Tobacco Use: Former    Smokeless Tobacco Use: Never    Passive Exposure: Not on file  Transportation Needs: No Transportation Needs (10/07/2021)   PRAPARE - Hydrologist (Medical): No    Lack of Transportation (Non-Medical): No    CCM Care Plan  Allergies  Allergen Reactions   Codeine Nausea And Vomiting    Medications Reviewed Today     Reviewed by Nolon Stalls, Kimber Relic, RN (Registered Nurse) on 10/06/21 at 1129  Med List Status: <None>    Medication Order Taking? Sig Documenting Provider Last Dose Status Informant  albuterol (VENTOLIN HFA) 108 (90 Base) MCG/ACT inhaler [id number] Yes TAKE 2 PUFFS BY MOUTH EVERY 6  HOURS AS NEEDED FOR WHEEZE OR SHORTNESS OF BREATH Payne, Elise T, FNP Taking Active Self  apixaban (ELIQUIS) 5 MG TABS tablet [id number] Yes Take 1 tablet (5 mg total) by mouth 2 (two) times daily. Schnier, Gregory G, MD Taking Active Self  atorvastatin (LIPITOR) 40 MG tablet [id number] Yes Take 1 tablet (40 mg total) by mouth daily. Payne, Elise T, FNP Taking Active   carvedilol (COREG) 12.5 MG tablet [id number] Yes Take 1 tablet (12.5 mg total) by mouth 2 (two) times daily with a meal. Agbor-Etang, Brian, MD Taking Active Self  clopidogrel (PLAVIX) 75 MG tablet [id number] Yes Take 1 tablet (75 mg total) by mouth daily. Zhang, Dekui, MD Taking Active   Dulaglutide 3 MG/0.5ML SOPN [id number] Yes Inject into the skin once a week. [provider] Taking Active Self  fluticasone (FLONASE) 50 MCG/ACT nasal spray [id number] Yes Place 2 sprays into both nostrils daily. Payne, Elise T, FNP Taking Active Self  gabapentin (NEURONTIN) 300 MG capsule [id number] Yes Take 300 mg by mouth 2 (two) times daily. [provider] Taking Active Self  glucose blood test strip [id number] Yes OneTouch Verio strips [provider] Taking Active Self  insulin aspart (NOVOLOG) 100 UNIT/ML injection [id number] Yes Insulin pump [provider] Taking Active Self  Insulin Disposable Pump (OMNIPOD DASH PODS, GEN 4,) MISC [id number] Yes USE 1 POD EACH EVERY 72    HOURS [provider] Taking Active Self  Insulin Human (INSULIN PUMP) SOLN [id number] Yes Per endocrinoloy Elgergawy, Dawood S, MD Taking Active Self  loratadine (CLARITIN) 10 MG tablet [id number] Yes Take 1 tablet by mouth daily. [provider] Taking Active Self           Med Note (SAM, RACHEL A   Fri Mar 21, 2015 10:46 AM)    losartan (COZAAR) 25  MG tablet [id number] Yes Take 25 mg by mouth 2 (two) times daily. [provider] Taking Active   oxyCODONE (OXY IR/ROXICODONE) 5 MG immediate release tablet [id number] Yes Take 1 tablet (5 mg total) by mouth every 8 (eight) hours as needed for severe pain. Brown, Fallon E, NP Taking Active   pantoprazole (PROTONIX) 40 MG tablet [id number] Yes Take 1 tablet (40 mg total) by mouth 2 (two) times daily. Zhang, Dekui, MD Taking Active   PARoxetine (PAXIL) 40 MG tablet [id number] Yes Take 1 tablet (40 mg total) by mouth daily. Hisada, Reina, MD Taking Active Self  silver sulfADIAZINE (SILVADENE) 1 % cream [id number] Yes Apply 1 Application topically daily. Brown, Fallon E, NP Taking Active   Spacer/Aero-Holding Chambers (OPTICHAMBER DIAMOND) MISC [id number] Yes Use with inhaler to ensure medication is delivered throughout lungs Payne, Elise T, FNP Taking Active Self  torsemide (DEMADEX) 20 MG tablet [id number] Yes Take 1 tablet (20 mg total) by mouth every Monday, Wednesday, and Friday. Zhang, Dekui, MD Taking Active   traMADol (ULTRAM) 50 MG tablet [id number] Yes Take 50 mg by mouth 2 (two) times daily as needed. [provider] Taking Active   Vitamin D, Ergocalciferol, (DRISDOL) 1.25 MG (50000 UT) CAPS capsule [id number] Yes Take 50,000 Units by mouth once a week. (Sundays) [provider] Taking Active Self  Med List Note (Garner, Cynthia F, RN 10/02/20 0954): MR 12/31/20 UDS 10/02/20            Patient Active Problem List   Diagnosis Date Noted   Hospital discharge follow-up 09/02/2021   Heart failure (HCC) 09/02/2021   Hyperkalemia 08/15/2021   GI bleed 08/14/2021     Acute respiratory failure with hypoxia (HCC) 08/14/2021   Acute on chronic diastolic CHF (congestive heart failure) (HCC) 08/14/2021   Atherosclerosis of native arteries of the extremities with ulceration (HCC) 08/12/2021   Positive depression screening 07/14/2021   DOE (dyspnea on exertion) 07/14/2021    Bilateral leg edema 07/14/2021   Fall 04/13/2021   Anemia in chronic kidney disease 03/18/2021   Heart failure, unspecified (HCC) 03/18/2021   Hyperparathyroidism due to renal insufficiency (HCC) 03/18/2021   Chronic depression 03/18/2021   Overweight 03/18/2021   Poor appetite 02/13/2021   MDD (major depressive disorder), recurrent episode, mild (HCC) 02/13/2021   Benign hypertensive kidney disease with chronic kidney disease 10/14/2020   CKD (chronic kidney disease), stage IV (HCC) 05/31/2020   Compression fracture of L1 lumbar vertebra (HCC) 01/30/2019   Compression fracture of body of thoracic vertebra (HCC) 01/30/2019   Lumbar foraminal stenosis (RIGHT L1) 01/30/2019   Spinal stenosis of lumbar region with neurogenic claudication (L4-L5) 01/30/2019   Osteoporosis, post-menopausal 04/14/2018   Vitamin D deficiency 04/14/2018   Chronic deep vein thrombosis (DVT) (HCC) 02/09/2018   Post-menopausal 11/17/2017   Aortic atherosclerosis (HCC) 11/08/2017   Snoring 04/24/2015   Sleep paralysis, recurrent isolated 04/24/2015   Hypersomnia with sleep apnea 04/24/2015   Cataplexy 04/24/2015   Morbid obesity due to excess calories (HCC) 04/24/2015   COPD (chronic obstructive pulmonary disease) (HCC) 04/24/2015   Embolic stroke (HCC) 04/23/2015   Acute renal failure superimposed on stage 4 chronic kidney disease (HCC) 01/29/2015   Dizziness and giddiness 07/05/2014   Hypertension associated with diabetes (HCC) 07/05/2014   Carbuncle and furuncle 07/05/2014   Pain of perianal area 07/05/2014   Guttate psoriasis 07/05/2014   H/O: osteoarthritis 06/05/2014   Anxiety, generalized 06/05/2014   Microalbuminuria 12/23/2013   Long term current use of insulin (HCC) 12/23/2013   Compulsive tobacco user syndrome 12/23/2013   Type 2 diabetes mellitus with other diabetic kidney complication (HCC) 12/23/2013   Disturbance of skin sensation 09/02/2006   Difficulty hearing 07/25/2006   Cephalalgia  05/27/2006   Hyperlipidemia associated with type 2 diabetes mellitus (HCC) 10/22/2005   Arthritis, degenerative 10/22/2005    Immunization History  Administered Date(s) Administered   Fluad Quad(high Dose 65+) 01/07/2021   Influenza Inj Mdck Quad Pf 12/20/2018   Influenza Split 01/05/2006, 10/31/2007, 03/03/2009, 12/16/2009, 12/23/2010, 12/24/2011, 12/19/2013   Influenza,inj,Quad PF,6+ Mos 11/23/2012, 11/07/2015, 11/15/2017   Influenza-Unspecified 11/07/2015, 11/03/2016   PFIZER(Purple Top)SARS-COV-2 Vaccination 03/01/2019, 03/22/2019   Pneumococcal Polysaccharide-23 12/23/2010   Zoster Recombinat (Shingrix) 06/03/2016, 12/11/2016    Conditions to be addressed/monitored:  Hypertension, Hyperlipidemia, Diabetes, Heart Failure, Coronary Artery Disease, COPD, Chronic Kidney Disease, Depression, Anxiety, Osteoporosis, Tobacco use, and History of stroke   Care Plan : General Pharmacy (Adult)  Updates made by ,  A, RPH since 10/07/2021 12:00 AM     Problem: Hypertension, Hyperlipidemia, Diabetes, Heart Failure, Coronary Artery Disease, COPD, Chronic Kidney Disease, Depression, Anxiety, Osteoporosis, Tobacco use, and History of stroke   Priority: High     Long-Range Goal: Patient-Specific Goal   Start Date: 10/07/2021  Expected End Date: 10/08/2022  This Visit's Progress: On track  Priority: High  Note:   Current Barriers:  No barriers noted  Pharmacist Clinical Goal(s):  Patient will maintain control of heart failure as evidenced by lack of exacerbations  through collaboration with PharmD and provider.   Interventions: 1:1 collaboration with Payne, Elise T, FNP regarding development and update of comprehensive plan of care as evidenced by provider attestation and   co-signature Inter-disciplinary care team collaboration (see longitudinal plan of care) Comprehensive medication review performed; medication list updated in electronic medical record  Heart Failure (Goal:  manage symptoms and prevent exacerbations) -Controlled -Last ejection fraction: 60-65% (Date: 2019) -HF type: HF w/recovered EF  -NYHA Class: II (slight limitation of activity) -Current treatment: Carvedilol 12.5 mg twice daily  Torsemide 20 mg every Mon/Wed/Fri  -Medications previously tried: HCTZ, Losartan (AKI), Metoprolol, Spironolactone  -Recommended to continue current medication  Hyperlipidemia: (LDL goal < 55) -Controlled -Current treatment: Atorvastatin 40 mg daily  -Current treatment: Clopidogrel 75 mg daily  -Medications previously tried: NA  -Recommended to continue current medication  Diabetes (A1c goal <8%) -Uncontrolled -Managed by Dr. Solum  -Current medications: Trulicity 3 mg weekly on Mondays  Novolog via insulin pump  -Medications previously tried: Ozempic (nausea), glyburide/metformin (CKD),   -Current home glucose readings (uses freestyle Libre)  -Denies hypoglycemic/hyperglycemic symptoms -Recommended to continue current medication  COPD (Goal: control symptoms and prevent exacerbations) -Controlled -Current treatment  Albuterol 2 puffs every 6 hours as needed  Flonase  Loratadine 10 mg daily  -Medications previously tried: NA  -Frequency of rescue inhaler use: <1x in past month.  -Recommended to continue current medication  Depression/Anxiety (Goal: Maintain symptom remission) -Controlled -Current treatment: Paroxetine 40 mg daily  -Medications previously tried/failed: Aripiprazole  -Recommended to continue current medication  Chronic DVTs (Goal: Prevent clots) -Controlled -Current treatment  Eliquis 5 mg twice daily  -Medications previously tried: NA  -Recommended to continue current medication  Chronic Kidney Disease Stage 4  -All medications assessed for renal dosing and appropriateness in chronic kidney disease. -told by Dr. Korrapati 4 glasses of water daily.  -Drinks unsweetened tea 4-5 glasses. 2-3 of water.   -Recommended to  continue current medication  Patient Goals/Self-Care Activities Patient will:  - weigh daily, and contact provider if weight gain of greater than 2 pounds in 24 hours  Follow Up Plan: Telephone follow up appointment with care management team member scheduled for:  10/16/2021 at 1:00 PM      Medication Assistance: None required.  Patient affirms current coverage meets needs.  Compliance/Adherence/Medication fill history: Care Gaps: Tetanus Vaccine Pneumonia Vaccine Urine Microalbumin COVID-19 Vaccine Foot Exam  Ophthalmology Exam A1C 11.7% on 08/20/2021  Star-Rating Drugs: Atorvastatin 40 mg last filled 05/09/2020 [facility] day supply [facility]. Trulicity 3 mg last filled 04/18/2021 for 84 day supply at [facility].  Patient's preferred pharmacy is:  [id number] - [place], [place] - [address] [place]  [address] Phone: [phone] Fax: [phone]  Uses pill box? Yes Pt endorses 100% compliance  We discussed: Current pharmacy is preferred with insurance plan and patient is satisfied with pharmacy services Patient decided to: Continue current medication management strategy  Care Plan and Follow Up Patient Decision:  Patient agrees to Care Plan and Follow-up.  Plan: Telephone follow up appointment with care management team member scheduled for:  10/16/2021 at 1:00 PM  Alex , PharmD, BCACP, CPP  Clinical Pharmacist Practitioner  [facility] [phone] 

## 2021-09-09 DIAGNOSIS — Z48812 Encounter for surgical aftercare following surgery on the circulatory system: Secondary | ICD-10-CM | POA: Diagnosis not present

## 2021-09-09 DIAGNOSIS — N184 Chronic kidney disease, stage 4 (severe): Secondary | ICD-10-CM | POA: Diagnosis not present

## 2021-09-09 DIAGNOSIS — I110 Hypertensive heart disease with heart failure: Secondary | ICD-10-CM | POA: Diagnosis not present

## 2021-09-09 DIAGNOSIS — E1151 Type 2 diabetes mellitus with diabetic peripheral angiopathy without gangrene: Secondary | ICD-10-CM | POA: Diagnosis not present

## 2021-09-09 DIAGNOSIS — E1122 Type 2 diabetes mellitus with diabetic chronic kidney disease: Secondary | ICD-10-CM | POA: Diagnosis not present

## 2021-09-09 DIAGNOSIS — I5033 Acute on chronic diastolic (congestive) heart failure: Secondary | ICD-10-CM | POA: Diagnosis not present

## 2021-09-10 ENCOUNTER — Ambulatory Visit (INDEPENDENT_AMBULATORY_CARE_PROVIDER_SITE_OTHER): Payer: Medicare Other | Admitting: Vascular Surgery

## 2021-09-10 ENCOUNTER — Encounter (INDEPENDENT_AMBULATORY_CARE_PROVIDER_SITE_OTHER): Payer: Self-pay | Admitting: Vascular Surgery

## 2021-09-10 VITALS — BP 143/77 | HR 77 | Resp 17 | Ht 66.0 in | Wt 224.0 lb

## 2021-09-10 DIAGNOSIS — I7025 Atherosclerosis of native arteries of other extremities with ulceration: Secondary | ICD-10-CM | POA: Diagnosis not present

## 2021-09-10 DIAGNOSIS — N184 Chronic kidney disease, stage 4 (severe): Secondary | ICD-10-CM

## 2021-09-10 DIAGNOSIS — J449 Chronic obstructive pulmonary disease, unspecified: Secondary | ICD-10-CM

## 2021-09-10 DIAGNOSIS — I82511 Chronic embolism and thrombosis of right femoral vein: Secondary | ICD-10-CM

## 2021-09-10 NOTE — Progress Notes (Signed)
MRN : [id number]  Jocelyn Wells is a 66 y.o. (09/06/1955) female who presents with chief complaint of check circulation.  History of Present Illness:   The patient returns to the office for followup and review status post angiogram with intervention on 08/12/2021.   Procedure:  Percutaneous transluminal angioplasty and stent placement left superficial femoral artery and popliteal to 5 mm proximally and 4 mm distally. 2.    Percutaneous transluminal angioplasty of the left common iliac artery to 9 mm.   3.    Percutaneous transluminal angioplasty of the left peroneal artery to 3 mm.   4.    Mechanical thrombectomy left tibioperoneal trunk and peroneal.   The patient notes improvement in the lower extremity symptoms. No interval shortening of the patient's claudication distance or rest pain symptoms. No new ulcers or wounds have occurred since the last visit.  The wound that prompted the angiogram on the left upper lateral shin is followed by visiting nurse.  It is not painful.  She experienced contrast nephropathy after the angiogram and require a brief period of dialysis.  She has remained off HD since DC  No documented history of amaurosis fugax or recent TIA symptoms. There are no recent neurological changes noted. No documented history of DVT, PE or superficial thrombophlebitis. The patient denies recent episodes of angina or shortness of breath.    Current Meds  Medication Sig   albuterol (VENTOLIN HFA) 108 (90 Base) MCG/ACT inhaler TAKE 2 PUFFS BY MOUTH EVERY 6 HOURS AS NEEDED FOR WHEEZE OR SHORTNESS OF BREATH   apixaban (ELIQUIS) 5 MG TABS tablet Take 1 tablet (5 mg total) by mouth 2 (two) times daily.   atorvastatin (LIPITOR) 40 MG tablet Take 1 tablet (40 mg total) by mouth daily.   carvedilol (COREG) 12.5 MG tablet Take 1 tablet (12.5 mg total) by mouth 2 (two) times daily with a meal.   clopidogrel (PLAVIX) 75 MG tablet Take 1 tablet (75 mg total) by mouth  daily.   Dulaglutide 3 MG/0.5ML SOPN Inject into the skin once a week.   fluticasone (FLONASE) 50 MCG/ACT nasal spray Place 2 sprays into both nostrils daily.   gabapentin (NEURONTIN) 300 MG capsule Take 300 mg by mouth 2 (two) times daily.   glucose blood test strip OneTouch Verio strips   insulin aspart (NOVOLOG) 100 UNIT/ML injection Inject up to 100 units daily in insulin pump   Insulin Disposable Pump (OMNIPOD DASH PODS, GEN 4,) MISC USE 1 POD EACH EVERY 72    HOURS   Insulin Human (INSULIN PUMP) SOLN Per endocrinoloy   loratadine (CLARITIN) 10 MG tablet Take 1 tablet by mouth daily.   pantoprazole (PROTONIX) 40 MG tablet Take 1 tablet (40 mg total) by mouth 2 (two) times daily.   PARoxetine (PAXIL) 40 MG tablet Take 1 tablet (40 mg total) by mouth daily.   silver sulfADIAZINE (SILVADENE) 1 % cream Apply 1 Application topically daily.   Spacer/Aero-Holding Chambers [facility] DIAMOND) MISC Use with inhaler to ensure medication is delivered throughout lungs   torsemide (DEMADEX) 20 MG tablet Take 1 tablet (20 mg total) by mouth every Monday, Wednesday, and Friday.   Vitamin D, Ergocalciferol, (DRISDOL) 1.25 MG (50000 UT) CAPS capsule Take 50,000 Units by mouth once a week. (Sundays)    Past Medical History:  Diagnosis Date   Anxiety    Cardiomyopathy [facility])    new to her Jan  2017   Chronic kidney disease    COPD (chronic obstructive pulmonary disease) (HCC)    Depression    Diabetes mellitus, type II (HCC)    HTN (hypertension)    Obesity    Osteoporosis    PONV (postoperative nausea and vomiting)    Stroke [facility])    Jan 2017    Past Surgical History:  Procedure Laterality Date   ABDOMINAL HYSTERECTOMY     ANKLE FRACTURE SURGERY Left 2002   BILATERAL SALPINGOOPHORECTOMY  2000   CARDIAC CATHETERIZATION N/A 03/25/2015   Procedure: Right/Left Heart Cath and Coronary Angiography;  Surgeon: Belva Crome, MD;  Location: [place] [facility] LAB;  Service: Cardiovascular;  Laterality:  N/A;   CESAREAN SECTION  1978   COLONOSCOPY WITH PROPOFOL N/A 09/22/2016   Procedure: COLONOSCOPY WITH PROPOFOL;  Surgeon: Manya Silvas, MD;  Location: [facility] ENDOSCOPY;  Service: Endoscopy;  Laterality: N/A;   EP IMPLANTABLE DEVICE N/A 08/05/2015   Procedure: Loop Recorder Insertion;  Surgeon: Deboraha Sprang, MD;  Location: [place] [facility] LAB;  Service: Cardiovascular;  Laterality: N/A;   ESOPHAGOGASTRODUODENOSCOPY (EGD) WITH PROPOFOL N/A 09/22/2016   Procedure: ESOPHAGOGASTRODUODENOSCOPY (EGD) WITH PROPOFOL;  Surgeon: Manya Silvas, MD;  Location: [facility] ENDOSCOPY;  Service: Endoscopy;  Laterality: N/A;   ESOPHAGOGASTRODUODENOSCOPY (EGD) WITH PROPOFOL N/A 12/08/2016   Procedure: ESOPHAGOGASTRODUODENOSCOPY (EGD) WITH PROPOFOL;  Surgeon: Manya Silvas, MD;  Location: [facility] ENDOSCOPY;  Service: Endoscopy;  Laterality: N/A;   KNEE ARTHROSCOPY Left 2005   LOWER EXTREMITY ANGIOGRAPHY Left 08/12/2021   Procedure: Lower Extremity Angiography;  Surgeon: Katha Cabal, MD;  Location: [place] [facility];  Service: Cardiovascular;  Laterality: Left;   TEE WITHOUT CARDIOVERSION N/A 01/31/2015   Procedure: TRANSESOPHAGEAL ECHOCARDIOGRAM (TEE);  Surgeon: Lelon Perla, MD;  Location: [facility] ENDOSCOPY;  Service: Cardiovascular;  Laterality: N/A;   TEMPORARY DIALYSIS CATHETER N/A 08/14/2021   Procedure: TEMPORARY DIALYSIS CATHETER;  Surgeon: Katha Cabal, MD;  Location: [place] [facility];  Service: Cardiovascular;  Laterality: N/A;   TUBAL LIGATION  1978   ULNAR NERVE TRANSPOSITION  2008    Social History Social History   Tobacco Use   Smoking status: Former    Packs/day: 1.00    Years: 36.00    Total pack years: 36.00    Types: Cigarettes    Start date: 02/08/1974   Smokeless tobacco: Never   Tobacco comments:    Quit 03/2021  Vaping Use   Vaping Use: Never used  Substance Use Topics   Alcohol use: No    Alcohol/week: 0.0 standard drinks of alcohol   Drug use: No    Family  History Family History  Problem Relation Age of Onset   Heart disease Mother        died from CHF   Asthma Mother    Diabetes Mother    Heart disease Father    Aneurysm Father    COPD Brother    Diabetes Brother    Alcohol abuse Paternal Aunt    Anemia Neg Hx    Arrhythmia Neg Hx    Clotting disorder Neg Hx    Fainting Neg Hx    Heart attack Neg Hx    Heart failure Neg Hx    Hyperlipidemia Neg Hx    Hypertension Neg Hx     Allergies  Allergen Reactions   Codeine Nausea And Vomiting     REVIEW OF SYSTEMS (Negative unless checked)  Constitutional: '[]'$ Weight loss  '[]'$ Fever  '[]'$ Chills Cardiac: '[]'$ Chest  pain   '[]'$ Chest pressure   '[]'$ Palpitations   '[]'$ Shortness of breath when laying flat   '[]'$ Shortness of breath with exertion. Vascular:  '[x]'$ Pain in legs with walking   '[]'$ Pain in legs at rest  '[]'$ History of DVT   '[]'$ Phlebitis   '[]'$ Swelling in legs   '[]'$ Varicose veins   '[x]'$ Healing ulcers Pulmonary:   '[]'$ Uses home oxygen   '[]'$ Productive cough   '[]'$ Hemoptysis   '[]'$ Wheeze  '[]'$ COPD   '[]'$ Asthma Neurologic:  '[]'$ Dizziness   '[]'$ Seizures   '[]'$ History of stroke   '[]'$ History of TIA  '[]'$ Aphasia   '[]'$ Vissual changes   '[]'$ Weakness or numbness in arm   '[]'$ Weakness or numbness in leg Musculoskeletal:   '[]'$ Joint swelling   '[]'$ Joint pain   '[]'$ Low back pain Hematologic:  '[]'$ Easy bruising  '[]'$ Easy bleeding   '[]'$ Hypercoagulable state   '[]'$ Anemic Gastrointestinal:  '[]'$ Diarrhea   '[]'$ Vomiting  '[]'$ Gastroesophageal reflux/heartburn   '[]'$ Difficulty swallowing. Genitourinary:  '[]'$ Chronic kidney disease   '[]'$ Difficult urination  '[]'$ Frequent urination   '[]'$ Blood in urine Skin:  '[]'$ Rashes   '[x]'$ Ulcers  Psychological:  '[]'$ History of anxiety   '[]'$  History of major depression.  Physical Examination  Vitals:   09/10/21 0841  BP: (!) 143/77  Pulse: 77  Resp: 17  Weight: 224 lb (101.6 kg)  Height: '5\' 6"'$  (1.676 m)   Body mass index is 36.15 kg/m. Gen: WD/WN, NAD Head: [place]/AT, No temporalis wasting.  Ear/Nose/Throat: Hearing grossly intact, nares w/o  erythema or drainage Eyes: PER, EOMI, sclera nonicteric.  Neck: Supple, no masses.  No bruit or JVD.  Pulmonary:  Good air movement, no audible wheezing, no use of accessory muscles.  Cardiac: RRR, normal S1, S2, no Murmurs. Vascular:  mild trophic changes, no open wounds Vessel Right Left  Radial Palpable Palpable  PT Not Palpable Not Palpable  DP Not Palpable Not Palpable  Gastrointestinal: soft, non-distended. No guarding/no peritoneal signs.  Musculoskeletal: M/S 5/5 throughout.  No visible deformity.  Neurologic: CN 2-12 intact. Pain and light touch intact in extremities.  Symmetrical.  Speech is fluent. Motor exam as listed above. Psychiatric: Judgment intact, Mood & affect appropriate for pt's clinical situation. Dermatologic: No rashes or ulcers noted.  No changes consistent with cellulitis.   CBC Lab Results  Component Value Date   WBC 12.8 (H) 09/02/2021   HGB 10.8 (L) 09/02/2021   HCT 35.8 09/02/2021   MCV 84 09/02/2021   PLT 499 (H) 09/02/2021    BMET    Component Value Date/Time   NA 141 09/02/2021 1408   K 4.7 09/02/2021 1408   CL 105 09/02/2021 1408   CO2 18 (L) 09/02/2021 1408   GLUCOSE 248 (H) 09/02/2021 1408   GLUCOSE 165 (H) 08/23/2021 0408   BUN 104 (HH) 09/02/2021 1408   CREATININE 3.35 (H) 09/02/2021 1408   CREATININE 1.37 (H) 03/21/2015 1451   CALCIUM 9.1 09/02/2021 1408   GFRNONAA 20 (L) 08/23/2021 0408   GFRAA 33 (L) 04/23/2019 1446   Estimated Creatinine Clearance: 19.6 mL/min (A) (by C-G formula based on SCr of 3.35 mg/dL (H)).  COAG Lab Results  Component Value Date   INR 1.5 (H) 08/14/2021   INR 0.93 10/05/2017   INR 1.05 03/21/2015    Radiology US RENAL  Result Date: 08/14/2021 CLINICAL DATA:  Acute kidney injury EXAM: RENAL / URINARY TRACT ULTRASOUND COMPLETE COMPARISON:  04/07/2021 ultrasound FINDINGS: Right Kidney: Renal measurements: 11.4 x 5.5 x 4.9 cm = volume: 159 mL. Cortex is echogenic. No hydronephrosis. Subcentimeter 7 mm  hypoechoic area at the  upper pole. Left Kidney: Renal measurements: 11.3 x 5.5 x 5.3 cm = volume: 171 mL. Cortex is echogenic. No mass or hydronephrosis Bladder: Appears normal for degree of bladder distention. Other: None. IMPRESSION: 1. Slightly echogenic cortex consistent with medical renal disease. No hydronephrosis 2. Subcentimeter hypoechoic focus at the upper pole right kidney, probably a tiny cyst but small size limits further assessment. Suggest six-month ultrasound follow-up Electronically Signed   By: Donavan Foil M.D.   On: 08/14/2021 17:52   PERIPHERAL VASCULAR CATHETERIZATION  Result Date: 08/14/2021 See surgical note for result.  DG Chest Port 1 View  Result Date: 08/14/2021 CLINICAL DATA:  Shortness of breath EXAM: PORTABLE CHEST 1 VIEW COMPARISON:  08/13/2021 FINDINGS: Chronic cardiomegaly. Chronic interstitial coarsening/airway thickening. Streaky density in the right lower lung. Milder opacity at the level of the lingula. No effusion or pneumothorax. IMPRESSION: 1. Lower lobe infiltrates, greater on the right. 2. Chronic cardiomegaly and airway thickening. Electronically Signed   By: Jorje Guild M.D.   On: 08/14/2021 06:22   DG Chest 2 View  Result Date: 08/13/2021 CLINICAL DATA:  Vomiting EXAM: CHEST - 2 VIEW COMPARISON:  07/14/2021 FINDINGS: Frontal and lateral views of the chest demonstrates stable enlargement the cardiac silhouette. Loop recorder left anterior chest unchanged. No acute airspace disease, effusion, or pneumothorax. No acute bony abnormalities. IMPRESSION: 1. Stable enlarged cardiac silhouette. 2. No acute airspace disease. Electronically Signed   By: Randa Ngo M.D.   On: 08/13/2021 16:06   DG Abd 1 View  Result Date: 08/13/2021 CLINICAL DATA:  Vomiting EXAM: ABDOMEN - 1 VIEW COMPARISON:  None Available. FINDINGS: No dilated large or small bowel. Moderate volume stool throughout the colon. Gas the rectum. Several small gallstones noted. Degenerative changes  of the spine. IMPRESSION: 1. No evidence of bowel obstruction. 2. Moderate volume stool throughout the colon. 3. Cholelithiasis. Electronically Signed   By: Suzy Bouchard M.D.   On: 08/13/2021 16:06   PERIPHERAL VASCULAR CATHETERIZATION  Result Date: 08/12/2021 See surgical note for result.    Assessment/Plan 1. Atherosclerosis of native arteries of the extremities with ulceration ([place]) Recommend:  The patient is status post successful angiogram with intervention.  The patient reports that the claudication symptoms and leg pain has improved.   The patient denies lifestyle limiting changes at this point in time.  No further invasive studies, angiography or surgery at this time The patient should continue walking and begin a more formal exercise program.  The patient should continue antiplatelet therapy and aggressive treatment of the lipid abnormalities  Continued surveillance is indicated as atherosclerosis is likely to progress with time.    Patient should undergo noninvasive studies as ordered. The patient will follow up with me to review the studies.   - VAS [place] ABI WITH/WO TBI; Future  2. Chronic deep vein thrombosis (DVT) of femoral vein of right lower extremity (HCC) Recommend:   No surgery or intervention at this point in time.  IVC filter is not indicated at present.  Patient's duplex ultrasound of the venous system shows DVT from the popliteal to the femoral veins.  The patient is on anticoagulation, Eliquis   Elevation was stressed, such as the use of a recliner.  I have discussed  DVT and post phlebitic changes such as swelling and why it  causes symptoms such as pain.  The patient should wear graduated compression stockings beginning after three full days of anticoagulation.  The compression should be worn on a daily basis. The patient should  wearing the stockings first thing in the morning and removing them in the evening. The patient should not to sleep in the  stockings.  In addition, behavioral modification including elevation during the day and avoidance of prolonged dependency will be initiated.    The patient will continue anticoagulation for now as there have not been any problems or complications at this point.    3. CKD (chronic kidney disease), stage IV ([place]) The patient has advanced renal disease.  However, at the present time the patient is not yet on dialysis.  Avoid nephrotoxic medications and dehydration.  Given her contrast nephropathy with a minimal exposure to contrast, every effort should be made to avoid future exposures.  This was discussed with the patient  Further plans per nephrology.  She follows with Dr Candiss Norse  4. Chronic obstructive pulmonary disease, unspecified COPD type ([place]) Continue pulmonary medications and aerosols as already ordered, these medications have been reviewed and there are no changes at this time.      [name], MD  09/10/2021 8:43 AM

## 2021-09-11 DIAGNOSIS — N184 Chronic kidney disease, stage 4 (severe): Secondary | ICD-10-CM | POA: Diagnosis not present

## 2021-09-11 DIAGNOSIS — Z48812 Encounter for surgical aftercare following surgery on the circulatory system: Secondary | ICD-10-CM | POA: Diagnosis not present

## 2021-09-11 DIAGNOSIS — I110 Hypertensive heart disease with heart failure: Secondary | ICD-10-CM | POA: Diagnosis not present

## 2021-09-11 DIAGNOSIS — E1122 Type 2 diabetes mellitus with diabetic chronic kidney disease: Secondary | ICD-10-CM | POA: Diagnosis not present

## 2021-09-11 DIAGNOSIS — I5033 Acute on chronic diastolic (congestive) heart failure: Secondary | ICD-10-CM | POA: Diagnosis not present

## 2021-09-11 DIAGNOSIS — E1151 Type 2 diabetes mellitus with diabetic peripheral angiopathy without gangrene: Secondary | ICD-10-CM | POA: Diagnosis not present

## 2021-09-11 NOTE — Telephone Encounter (Signed)
Patient scheduled for Echo and follow up. Closing encounter.

## 2021-09-14 DIAGNOSIS — E1121 Type 2 diabetes mellitus with diabetic nephropathy: Secondary | ICD-10-CM | POA: Diagnosis not present

## 2021-09-14 DIAGNOSIS — I5033 Acute on chronic diastolic (congestive) heart failure: Secondary | ICD-10-CM | POA: Diagnosis not present

## 2021-09-14 DIAGNOSIS — E1142 Type 2 diabetes mellitus with diabetic polyneuropathy: Secondary | ICD-10-CM | POA: Diagnosis not present

## 2021-09-14 DIAGNOSIS — Z9641 Presence of insulin pump (external) (internal): Secondary | ICD-10-CM | POA: Diagnosis not present

## 2021-09-14 DIAGNOSIS — E1165 Type 2 diabetes mellitus with hyperglycemia: Secondary | ICD-10-CM | POA: Diagnosis not present

## 2021-09-14 DIAGNOSIS — E113393 Type 2 diabetes mellitus with moderate nonproliferative diabetic retinopathy without macular edema, bilateral: Secondary | ICD-10-CM | POA: Diagnosis not present

## 2021-09-14 DIAGNOSIS — R809 Proteinuria, unspecified: Secondary | ICD-10-CM | POA: Diagnosis not present

## 2021-09-14 DIAGNOSIS — I110 Hypertensive heart disease with heart failure: Secondary | ICD-10-CM | POA: Diagnosis not present

## 2021-09-14 DIAGNOSIS — N184 Chronic kidney disease, stage 4 (severe): Secondary | ICD-10-CM | POA: Diagnosis not present

## 2021-09-14 DIAGNOSIS — E1159 Type 2 diabetes mellitus with other circulatory complications: Secondary | ICD-10-CM | POA: Diagnosis not present

## 2021-09-14 DIAGNOSIS — E1122 Type 2 diabetes mellitus with diabetic chronic kidney disease: Secondary | ICD-10-CM | POA: Diagnosis not present

## 2021-09-14 DIAGNOSIS — Z48812 Encounter for surgical aftercare following surgery on the circulatory system: Secondary | ICD-10-CM | POA: Diagnosis not present

## 2021-09-14 DIAGNOSIS — E1151 Type 2 diabetes mellitus with diabetic peripheral angiopathy without gangrene: Secondary | ICD-10-CM | POA: Diagnosis not present

## 2021-09-14 DIAGNOSIS — M810 Age-related osteoporosis without current pathological fracture: Secondary | ICD-10-CM | POA: Diagnosis not present

## 2021-09-14 DIAGNOSIS — E1129 Type 2 diabetes mellitus with other diabetic kidney complication: Secondary | ICD-10-CM | POA: Diagnosis not present

## 2021-09-14 DIAGNOSIS — Z794 Long term (current) use of insulin: Secondary | ICD-10-CM | POA: Diagnosis not present

## 2021-09-15 ENCOUNTER — Telehealth (INDEPENDENT_AMBULATORY_CARE_PROVIDER_SITE_OTHER): Payer: Self-pay | Admitting: Vascular Surgery

## 2021-09-15 ENCOUNTER — Other Ambulatory Visit (INDEPENDENT_AMBULATORY_CARE_PROVIDER_SITE_OTHER): Payer: Self-pay | Admitting: Nurse Practitioner

## 2021-09-15 MED ORDER — OXYCODONE HCL 5 MG PO TABS: 5.0000 mg | ORAL_TABLET | Freq: Three times a day (TID) | ORAL | 0 refills | Status: DC | PRN

## 2021-09-15 NOTE — Telephone Encounter (Signed)
Patient called in stating that when she was here in her last visit, she lost the scab on her leg and was told that was a good thing and its the healing process. But is saying her leg is in extreme pain. She said at night her pain level is at a 9 and during the day its at a 7. She has taking tramadol and its not working. She has taking oxcy before and its helped. She is wanting to know if Dr. Delana Meyer would send her in some pain medication    Perferred pharmacy :[id number]- [place], [place]- [address]  Please call and advise

## 2021-09-15 NOTE — Telephone Encounter (Signed)
I'm more concerned that something has happened to her stent since she's in that much pain.  I'll send her in a one time small rx of pain medication but we are going to need to see her w/in the next week with an arterial duplex

## 2021-09-16 ENCOUNTER — Telehealth (INDEPENDENT_AMBULATORY_CARE_PROVIDER_SITE_OTHER): Payer: Self-pay | Admitting: Vascular Surgery

## 2021-09-16 DIAGNOSIS — Z48812 Encounter for surgical aftercare following surgery on the circulatory system: Secondary | ICD-10-CM | POA: Diagnosis not present

## 2021-09-16 DIAGNOSIS — E1151 Type 2 diabetes mellitus with diabetic peripheral angiopathy without gangrene: Secondary | ICD-10-CM | POA: Diagnosis not present

## 2021-09-16 DIAGNOSIS — E1122 Type 2 diabetes mellitus with diabetic chronic kidney disease: Secondary | ICD-10-CM | POA: Diagnosis not present

## 2021-09-16 DIAGNOSIS — N184 Chronic kidney disease, stage 4 (severe): Secondary | ICD-10-CM | POA: Diagnosis not present

## 2021-09-16 DIAGNOSIS — I110 Hypertensive heart disease with heart failure: Secondary | ICD-10-CM | POA: Diagnosis not present

## 2021-09-16 DIAGNOSIS — I5033 Acute on chronic diastolic (congestive) heart failure: Secondary | ICD-10-CM | POA: Diagnosis not present

## 2021-09-16 NOTE — Telephone Encounter (Signed)
Patient has been scheduled for a [place] only on 09/22/2021. Because of scheduling we had nothing on the calendar to get her in with a provider. Patient was ok with this as well as she was ok with prescription going to pharmacy.   Please advise if that is ok or if patient needs to be overbooked on provider schedule for same day as [place]

## 2021-09-16 NOTE — Telephone Encounter (Signed)
Spoke with pt and let her know she has called the wrong office.  I let her know her appt her is on 10/15/2021 at 10.  She stated verbal understanding

## 2021-09-16 NOTE — Telephone Encounter (Signed)
Patient called and stated at her last appointment she got really sick after the ultra sound and think it is the dye from the ultra sound.  She wants to know if it in fact could be the dye that is making her sick.  Please advise before her next appointment Tuesday.

## 2021-09-17 ENCOUNTER — Ambulatory Visit: Payer: Self-pay

## 2021-09-17 DIAGNOSIS — J449 Chronic obstructive pulmonary disease, unspecified: Secondary | ICD-10-CM

## 2021-09-17 NOTE — Chronic Care Management (AMB) (Signed)
   09/17/2021  Jocelyn Wells August 27, 1955 [id number]  Documentation encounter created to complete case transition.  The care management team will continue to follow for care coordination.  [place] [facility] [phone]

## 2021-09-18 DIAGNOSIS — N184 Chronic kidney disease, stage 4 (severe): Secondary | ICD-10-CM | POA: Diagnosis not present

## 2021-09-18 DIAGNOSIS — Z48812 Encounter for surgical aftercare following surgery on the circulatory system: Secondary | ICD-10-CM | POA: Diagnosis not present

## 2021-09-18 DIAGNOSIS — I5033 Acute on chronic diastolic (congestive) heart failure: Secondary | ICD-10-CM | POA: Diagnosis not present

## 2021-09-18 DIAGNOSIS — E1151 Type 2 diabetes mellitus with diabetic peripheral angiopathy without gangrene: Secondary | ICD-10-CM | POA: Diagnosis not present

## 2021-09-18 DIAGNOSIS — E1122 Type 2 diabetes mellitus with diabetic chronic kidney disease: Secondary | ICD-10-CM | POA: Diagnosis not present

## 2021-09-18 DIAGNOSIS — I110 Hypertensive heart disease with heart failure: Secondary | ICD-10-CM | POA: Diagnosis not present

## 2021-09-21 ENCOUNTER — Other Ambulatory Visit (INDEPENDENT_AMBULATORY_CARE_PROVIDER_SITE_OTHER): Payer: Self-pay | Admitting: Nurse Practitioner

## 2021-09-21 DIAGNOSIS — E1122 Type 2 diabetes mellitus with diabetic chronic kidney disease: Secondary | ICD-10-CM | POA: Diagnosis not present

## 2021-09-21 DIAGNOSIS — N184 Chronic kidney disease, stage 4 (severe): Secondary | ICD-10-CM | POA: Diagnosis not present

## 2021-09-21 DIAGNOSIS — Z48812 Encounter for surgical aftercare following surgery on the circulatory system: Secondary | ICD-10-CM | POA: Diagnosis not present

## 2021-09-21 DIAGNOSIS — E1151 Type 2 diabetes mellitus with diabetic peripheral angiopathy without gangrene: Secondary | ICD-10-CM | POA: Diagnosis not present

## 2021-09-21 DIAGNOSIS — I5033 Acute on chronic diastolic (congestive) heart failure: Secondary | ICD-10-CM | POA: Diagnosis not present

## 2021-09-21 DIAGNOSIS — I110 Hypertensive heart disease with heart failure: Secondary | ICD-10-CM | POA: Diagnosis not present

## 2021-09-21 DIAGNOSIS — Z9889 Other specified postprocedural states: Secondary | ICD-10-CM

## 2021-09-21 DIAGNOSIS — M79606 Pain in leg, unspecified: Secondary | ICD-10-CM

## 2021-09-21 NOTE — Telephone Encounter (Signed)
We will work her in if there is an abnormality on the ultrasound

## 2021-09-22 ENCOUNTER — Ambulatory Visit (INDEPENDENT_AMBULATORY_CARE_PROVIDER_SITE_OTHER): Payer: Medicare Other

## 2021-09-22 DIAGNOSIS — R809 Proteinuria, unspecified: Secondary | ICD-10-CM | POA: Diagnosis not present

## 2021-09-22 DIAGNOSIS — I509 Heart failure, unspecified: Secondary | ICD-10-CM | POA: Diagnosis not present

## 2021-09-22 DIAGNOSIS — I129 Hypertensive chronic kidney disease with stage 1 through stage 4 chronic kidney disease, or unspecified chronic kidney disease: Secondary | ICD-10-CM | POA: Diagnosis not present

## 2021-09-22 DIAGNOSIS — I110 Hypertensive heart disease with heart failure: Secondary | ICD-10-CM | POA: Diagnosis not present

## 2021-09-22 DIAGNOSIS — M79606 Pain in leg, unspecified: Secondary | ICD-10-CM | POA: Diagnosis not present

## 2021-09-22 DIAGNOSIS — U071 COVID-19: Secondary | ICD-10-CM | POA: Diagnosis not present

## 2021-09-22 DIAGNOSIS — E1151 Type 2 diabetes mellitus with diabetic peripheral angiopathy without gangrene: Secondary | ICD-10-CM | POA: Diagnosis not present

## 2021-09-22 DIAGNOSIS — Z9889 Other specified postprocedural states: Secondary | ICD-10-CM | POA: Diagnosis not present

## 2021-09-22 DIAGNOSIS — E1122 Type 2 diabetes mellitus with diabetic chronic kidney disease: Secondary | ICD-10-CM | POA: Diagnosis not present

## 2021-09-22 DIAGNOSIS — F32A Depression, unspecified: Secondary | ICD-10-CM | POA: Diagnosis not present

## 2021-09-22 DIAGNOSIS — I639 Cerebral infarction, unspecified: Secondary | ICD-10-CM | POA: Diagnosis not present

## 2021-09-22 DIAGNOSIS — D631 Anemia in chronic kidney disease: Secondary | ICD-10-CM | POA: Diagnosis not present

## 2021-09-22 DIAGNOSIS — E663 Overweight: Secondary | ICD-10-CM | POA: Diagnosis not present

## 2021-09-22 DIAGNOSIS — N179 Acute kidney failure, unspecified: Secondary | ICD-10-CM | POA: Diagnosis not present

## 2021-09-22 DIAGNOSIS — N184 Chronic kidney disease, stage 4 (severe): Secondary | ICD-10-CM | POA: Diagnosis not present

## 2021-09-22 DIAGNOSIS — I10 Essential (primary) hypertension: Secondary | ICD-10-CM | POA: Diagnosis not present

## 2021-09-22 DIAGNOSIS — I5033 Acute on chronic diastolic (congestive) heart failure: Secondary | ICD-10-CM | POA: Diagnosis not present

## 2021-09-22 DIAGNOSIS — Z48812 Encounter for surgical aftercare following surgery on the circulatory system: Secondary | ICD-10-CM | POA: Diagnosis not present

## 2021-09-22 DIAGNOSIS — I739 Peripheral vascular disease, unspecified: Secondary | ICD-10-CM

## 2021-09-22 DIAGNOSIS — N2581 Secondary hyperparathyroidism of renal origin: Secondary | ICD-10-CM | POA: Diagnosis not present

## 2021-09-23 DIAGNOSIS — I110 Hypertensive heart disease with heart failure: Secondary | ICD-10-CM | POA: Diagnosis not present

## 2021-09-23 DIAGNOSIS — J449 Chronic obstructive pulmonary disease, unspecified: Secondary | ICD-10-CM | POA: Diagnosis not present

## 2021-09-23 DIAGNOSIS — N184 Chronic kidney disease, stage 4 (severe): Secondary | ICD-10-CM | POA: Diagnosis not present

## 2021-09-23 DIAGNOSIS — E1151 Type 2 diabetes mellitus with diabetic peripheral angiopathy without gangrene: Secondary | ICD-10-CM | POA: Diagnosis not present

## 2021-09-23 DIAGNOSIS — M48061 Spinal stenosis, lumbar region without neurogenic claudication: Secondary | ICD-10-CM | POA: Diagnosis not present

## 2021-09-23 DIAGNOSIS — I5033 Acute on chronic diastolic (congestive) heart failure: Secondary | ICD-10-CM | POA: Diagnosis not present

## 2021-09-23 DIAGNOSIS — Z794 Long term (current) use of insulin: Secondary | ICD-10-CM | POA: Diagnosis not present

## 2021-09-23 DIAGNOSIS — Z7901 Long term (current) use of anticoagulants: Secondary | ICD-10-CM | POA: Diagnosis not present

## 2021-09-23 DIAGNOSIS — Z86718 Personal history of other venous thrombosis and embolism: Secondary | ICD-10-CM | POA: Diagnosis not present

## 2021-09-23 DIAGNOSIS — Z95820 Peripheral vascular angioplasty status with implants and grafts: Secondary | ICD-10-CM | POA: Diagnosis not present

## 2021-09-23 DIAGNOSIS — Z87891 Personal history of nicotine dependence: Secondary | ICD-10-CM | POA: Diagnosis not present

## 2021-09-23 DIAGNOSIS — Z7985 Long-term (current) use of injectable non-insulin antidiabetic drugs: Secondary | ICD-10-CM | POA: Diagnosis not present

## 2021-09-23 DIAGNOSIS — E1122 Type 2 diabetes mellitus with diabetic chronic kidney disease: Secondary | ICD-10-CM | POA: Diagnosis not present

## 2021-09-23 DIAGNOSIS — I70248 Atherosclerosis of native arteries of left leg with ulceration of other part of lower left leg: Secondary | ICD-10-CM | POA: Diagnosis not present

## 2021-09-23 DIAGNOSIS — Z79899 Other long term (current) drug therapy: Secondary | ICD-10-CM | POA: Diagnosis not present

## 2021-09-23 DIAGNOSIS — I480 Paroxysmal atrial fibrillation: Secondary | ICD-10-CM | POA: Diagnosis not present

## 2021-09-23 DIAGNOSIS — L97829 Non-pressure chronic ulcer of other part of left lower leg with unspecified severity: Secondary | ICD-10-CM | POA: Diagnosis not present

## 2021-09-23 DIAGNOSIS — Z79891 Long term (current) use of opiate analgesic: Secondary | ICD-10-CM | POA: Diagnosis not present

## 2021-09-23 DIAGNOSIS — Z9181 History of falling: Secondary | ICD-10-CM | POA: Diagnosis not present

## 2021-09-23 DIAGNOSIS — F419 Anxiety disorder, unspecified: Secondary | ICD-10-CM | POA: Diagnosis not present

## 2021-09-23 DIAGNOSIS — I70201 Unspecified atherosclerosis of native arteries of extremities, right leg: Secondary | ICD-10-CM | POA: Diagnosis not present

## 2021-09-23 DIAGNOSIS — Z8673 Personal history of transient ischemic attack (TIA), and cerebral infarction without residual deficits: Secondary | ICD-10-CM | POA: Diagnosis not present

## 2021-09-23 DIAGNOSIS — Z6835 Body mass index (BMI) 35.0-35.9, adult: Secondary | ICD-10-CM | POA: Diagnosis not present

## 2021-09-23 DIAGNOSIS — Z48812 Encounter for surgical aftercare following surgery on the circulatory system: Secondary | ICD-10-CM | POA: Diagnosis not present

## 2021-09-25 DIAGNOSIS — E1151 Type 2 diabetes mellitus with diabetic peripheral angiopathy without gangrene: Secondary | ICD-10-CM | POA: Diagnosis not present

## 2021-09-25 DIAGNOSIS — I5033 Acute on chronic diastolic (congestive) heart failure: Secondary | ICD-10-CM | POA: Diagnosis not present

## 2021-09-25 DIAGNOSIS — E1122 Type 2 diabetes mellitus with diabetic chronic kidney disease: Secondary | ICD-10-CM | POA: Diagnosis not present

## 2021-09-25 DIAGNOSIS — Z48812 Encounter for surgical aftercare following surgery on the circulatory system: Secondary | ICD-10-CM | POA: Diagnosis not present

## 2021-09-25 DIAGNOSIS — N184 Chronic kidney disease, stage 4 (severe): Secondary | ICD-10-CM | POA: Diagnosis not present

## 2021-09-25 DIAGNOSIS — I110 Hypertensive heart disease with heart failure: Secondary | ICD-10-CM | POA: Diagnosis not present

## 2021-09-28 DIAGNOSIS — Z48812 Encounter for surgical aftercare following surgery on the circulatory system: Secondary | ICD-10-CM | POA: Diagnosis not present

## 2021-09-28 DIAGNOSIS — E1151 Type 2 diabetes mellitus with diabetic peripheral angiopathy without gangrene: Secondary | ICD-10-CM | POA: Diagnosis not present

## 2021-09-28 DIAGNOSIS — E1122 Type 2 diabetes mellitus with diabetic chronic kidney disease: Secondary | ICD-10-CM | POA: Diagnosis not present

## 2021-09-28 DIAGNOSIS — N184 Chronic kidney disease, stage 4 (severe): Secondary | ICD-10-CM | POA: Diagnosis not present

## 2021-09-28 DIAGNOSIS — I110 Hypertensive heart disease with heart failure: Secondary | ICD-10-CM | POA: Diagnosis not present

## 2021-09-28 DIAGNOSIS — I5033 Acute on chronic diastolic (congestive) heart failure: Secondary | ICD-10-CM | POA: Diagnosis not present

## 2021-09-29 ENCOUNTER — Ambulatory Visit (INDEPENDENT_AMBULATORY_CARE_PROVIDER_SITE_OTHER): Payer: Medicare Other

## 2021-09-29 ENCOUNTER — Other Ambulatory Visit (INDEPENDENT_AMBULATORY_CARE_PROVIDER_SITE_OTHER): Payer: Self-pay | Admitting: Nurse Practitioner

## 2021-09-29 DIAGNOSIS — I428 Other cardiomyopathies: Secondary | ICD-10-CM

## 2021-09-29 DIAGNOSIS — N179 Acute kidney failure, unspecified: Secondary | ICD-10-CM

## 2021-09-29 LAB — ECHOCARDIOGRAM COMPLETE
AR max vel: 2.8 cm2
AV Area VTI: 2.67 cm2
AV Area mean vel: 2.65 cm2
AV Mean grad: 3 mmHg
AV Peak grad: 5.5 mmHg
Ao pk vel: 1.17 m/s
Area-P 1/2: 2.69 cm2
Calc EF: 46.7 %
S' Lateral: 3.6 cm
Single Plane A2C EF: 46.9 %
Single Plane A4C EF: 48.7 %

## 2021-09-30 DIAGNOSIS — E113513 Type 2 diabetes mellitus with proliferative diabetic retinopathy with macular edema, bilateral: Secondary | ICD-10-CM | POA: Diagnosis not present

## 2021-09-30 DIAGNOSIS — E1151 Type 2 diabetes mellitus with diabetic peripheral angiopathy without gangrene: Secondary | ICD-10-CM | POA: Diagnosis not present

## 2021-09-30 DIAGNOSIS — H4311 Vitreous hemorrhage, right eye: Secondary | ICD-10-CM | POA: Diagnosis not present

## 2021-09-30 DIAGNOSIS — H43823 Vitreomacular adhesion, bilateral: Secondary | ICD-10-CM | POA: Diagnosis not present

## 2021-09-30 DIAGNOSIS — H43811 Vitreous degeneration, right eye: Secondary | ICD-10-CM | POA: Diagnosis not present

## 2021-09-30 DIAGNOSIS — E1122 Type 2 diabetes mellitus with diabetic chronic kidney disease: Secondary | ICD-10-CM | POA: Diagnosis not present

## 2021-09-30 DIAGNOSIS — H31093 Other chorioretinal scars, bilateral: Secondary | ICD-10-CM | POA: Diagnosis not present

## 2021-09-30 DIAGNOSIS — N184 Chronic kidney disease, stage 4 (severe): Secondary | ICD-10-CM | POA: Diagnosis not present

## 2021-09-30 DIAGNOSIS — Z48812 Encounter for surgical aftercare following surgery on the circulatory system: Secondary | ICD-10-CM | POA: Diagnosis not present

## 2021-09-30 DIAGNOSIS — I5033 Acute on chronic diastolic (congestive) heart failure: Secondary | ICD-10-CM | POA: Diagnosis not present

## 2021-09-30 DIAGNOSIS — I110 Hypertensive heart disease with heart failure: Secondary | ICD-10-CM | POA: Diagnosis not present

## 2021-10-01 ENCOUNTER — Ambulatory Visit (INDEPENDENT_AMBULATORY_CARE_PROVIDER_SITE_OTHER): Payer: Medicare Other

## 2021-10-01 DIAGNOSIS — N179 Acute kidney failure, unspecified: Secondary | ICD-10-CM

## 2021-10-02 DIAGNOSIS — N184 Chronic kidney disease, stage 4 (severe): Secondary | ICD-10-CM | POA: Diagnosis not present

## 2021-10-02 DIAGNOSIS — I110 Hypertensive heart disease with heart failure: Secondary | ICD-10-CM | POA: Diagnosis not present

## 2021-10-02 DIAGNOSIS — Z48812 Encounter for surgical aftercare following surgery on the circulatory system: Secondary | ICD-10-CM | POA: Diagnosis not present

## 2021-10-02 DIAGNOSIS — E1122 Type 2 diabetes mellitus with diabetic chronic kidney disease: Secondary | ICD-10-CM | POA: Diagnosis not present

## 2021-10-02 DIAGNOSIS — E1151 Type 2 diabetes mellitus with diabetic peripheral angiopathy without gangrene: Secondary | ICD-10-CM | POA: Diagnosis not present

## 2021-10-02 DIAGNOSIS — I5033 Acute on chronic diastolic (congestive) heart failure: Secondary | ICD-10-CM | POA: Diagnosis not present

## 2021-10-05 DIAGNOSIS — I110 Hypertensive heart disease with heart failure: Secondary | ICD-10-CM | POA: Diagnosis not present

## 2021-10-05 DIAGNOSIS — N179 Acute kidney failure, unspecified: Secondary | ICD-10-CM | POA: Diagnosis not present

## 2021-10-05 DIAGNOSIS — N2581 Secondary hyperparathyroidism of renal origin: Secondary | ICD-10-CM | POA: Diagnosis not present

## 2021-10-05 DIAGNOSIS — I639 Cerebral infarction, unspecified: Secondary | ICD-10-CM | POA: Diagnosis not present

## 2021-10-05 DIAGNOSIS — Z48812 Encounter for surgical aftercare following surgery on the circulatory system: Secondary | ICD-10-CM | POA: Diagnosis not present

## 2021-10-05 DIAGNOSIS — N184 Chronic kidney disease, stage 4 (severe): Secondary | ICD-10-CM | POA: Diagnosis not present

## 2021-10-05 DIAGNOSIS — E1122 Type 2 diabetes mellitus with diabetic chronic kidney disease: Secondary | ICD-10-CM | POA: Diagnosis not present

## 2021-10-05 DIAGNOSIS — I129 Hypertensive chronic kidney disease with stage 1 through stage 4 chronic kidney disease, or unspecified chronic kidney disease: Secondary | ICD-10-CM | POA: Diagnosis not present

## 2021-10-05 DIAGNOSIS — I10 Essential (primary) hypertension: Secondary | ICD-10-CM | POA: Diagnosis not present

## 2021-10-05 DIAGNOSIS — D631 Anemia in chronic kidney disease: Secondary | ICD-10-CM | POA: Diagnosis not present

## 2021-10-05 DIAGNOSIS — U071 COVID-19: Secondary | ICD-10-CM | POA: Diagnosis not present

## 2021-10-05 DIAGNOSIS — R809 Proteinuria, unspecified: Secondary | ICD-10-CM | POA: Diagnosis not present

## 2021-10-05 DIAGNOSIS — I5033 Acute on chronic diastolic (congestive) heart failure: Secondary | ICD-10-CM | POA: Diagnosis not present

## 2021-10-05 DIAGNOSIS — E1151 Type 2 diabetes mellitus with diabetic peripheral angiopathy without gangrene: Secondary | ICD-10-CM | POA: Diagnosis not present

## 2021-10-05 DIAGNOSIS — E663 Overweight: Secondary | ICD-10-CM | POA: Diagnosis not present

## 2021-10-05 DIAGNOSIS — F32A Depression, unspecified: Secondary | ICD-10-CM | POA: Diagnosis not present

## 2021-10-05 DIAGNOSIS — I509 Heart failure, unspecified: Secondary | ICD-10-CM | POA: Diagnosis not present

## 2021-10-06 ENCOUNTER — Other Ambulatory Visit
Admission: RE | Admit: 2021-10-06 | Discharge: 2021-10-06 | Disposition: A | Discharge status: Home / Self Care | Payer: Medicare Other | Attending: Pulmonary Disease | Admitting: Pulmonary Disease

## 2021-10-06 ENCOUNTER — Ambulatory Visit (INDEPENDENT_AMBULATORY_CARE_PROVIDER_SITE_OTHER): Payer: Medicare Other | Admitting: Pulmonary Disease

## 2021-10-06 ENCOUNTER — Encounter: Payer: Self-pay | Admitting: Pulmonary Disease

## 2021-10-06 ENCOUNTER — Telehealth: Payer: Self-pay

## 2021-10-06 VITALS — BP 124/80 | HR 74 | Temp 97.7°F | Ht 66.0 in | Wt 230.6 lb

## 2021-10-06 DIAGNOSIS — R0602 Shortness of breath: Secondary | ICD-10-CM

## 2021-10-06 DIAGNOSIS — J449 Chronic obstructive pulmonary disease, unspecified: Secondary | ICD-10-CM | POA: Diagnosis not present

## 2021-10-06 DIAGNOSIS — R5383 Other fatigue: Secondary | ICD-10-CM

## 2021-10-06 LAB — TSH: TSH: 2.977 u[IU]/mL (ref 0.350–4.500)

## 2021-10-06 NOTE — Progress Notes (Unsigned)
Virtual Visit via Video Note  I connected with Jocelyn Wells on 10/07/21 at  1:00 PM EDT by a video enabled telemedicine application and verified that I am speaking with the correct person using two identifiers.  Location: Patient: home Provider: office Persons participated in the visit- patient, provider    I discussed the limitations of evaluation and management by telemedicine and the availability of in person appointments. The patient expressed understanding and agreed to proceed.    I discussed the assessment and treatment plan with the patient. The patient was provided an opportunity to ask questions and all were answered. The patient agreed with the plan and demonstrated an understanding of the instructions.   The patient was advised to call back or seek an in-person evaluation if the symptoms worsen or if the condition fails to improve as anticipated.  I provided 11 minutes of non-face-to-face time during this encounter.   Norman Clay, MD    [facility] MD/PA/NP OP Progress Note  10/07/2021 1:26 PM Jocelyn Wells  MRN:  [id number]  Chief Complaint:  Chief Complaint  Patient presents with   Follow-up   Depression   HPI:  - she had recannulization of the left lower extremity for limb salvage. Hospital course is complicated by acute renal failure superimposed on stage 4 chronic kidney disease.  This is a follow-up appointment for depression and PTSD.  She states that she was in the hospital.  She was having significant anxiety and a feeling depressed, thinking that she would not be able to come up from the hospital.  Both of her sons have been very helpful.  She thinks she has been better since being discharged from the hospital.  She states that she might need to be on dialysis she will have a short trip to [place] with her husband.  She sleeps better since being discharged.  She has better appetite.  Although she had a panic attack when she was in the hospital, it has been  getting better.  She denies SI.  She feels comfortable to stay on the medication as it is for now.    Daily routine: she does not have any routine. 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 She lost job as she was there "so long." She got disability for depression, followed by stroke.  Support: best friend, husband Household: husband Marital status: married for 24 years, married four times Number of children: 2 and 2 step children. She has 3 grandchildren (age 59, 74, 56) Her parents were separated, and she lived with her mother, brother. Both of her parents passed away  Visit Diagnosis:    ICD-10-CM   1. PTSD (post-traumatic stress disorder)  F43.10     2. MDD (major depressive disorder), recurrent, in partial remission (HCC)  F33.41 PARoxetine (PAXIL) 40 MG tablet      Past Psychiatric History: Please see initial evaluation for full details. I have reviewed the history. No updates at this time.     Past Medical History:  Past Medical History:  Diagnosis Date   Anxiety    Cardiomyopathy [facility])    new to her Jan 2017   Chronic kidney disease    COPD (chronic obstructive pulmonary disease) (HCC)    Depression    Diabetes mellitus, type II ([place])    HTN (hypertension)    Obesity    Osteoporosis    PONV (postoperative nausea and vomiting)    Stroke ([place])  Jan 2017    Past Surgical History:  Procedure Laterality Date   ABDOMINAL HYSTERECTOMY     ANKLE FRACTURE SURGERY Left 2002   BILATERAL SALPINGOOPHORECTOMY  2000   CARDIAC CATHETERIZATION N/A 03/25/2015   Procedure: Right/Left Heart Cath and Coronary Angiography;  Surgeon: Belva Crome, MD;  Location: [place] [facility];  Service: Cardiovascular;  Laterality: N/A;   CESAREAN SECTION  1978   COLONOSCOPY WITH PROPOFOL N/A 09/22/2016   Procedure: COLONOSCOPY WITH PROPOFOL;  Surgeon: Manya Silvas, MD;  Location: [facility] ENDOSCOPY;  Service: Endoscopy;   Laterality: N/A;   EP IMPLANTABLE DEVICE N/A 08/05/2015   Procedure: Loop Recorder Insertion;  Surgeon: Deboraha Sprang, MD;  Location: [place] [facility];  Service: Cardiovascular;  Laterality: N/A;   ESOPHAGOGASTRODUODENOSCOPY (EGD) WITH PROPOFOL N/A 09/22/2016   Procedure: ESOPHAGOGASTRODUODENOSCOPY (EGD) WITH PROPOFOL;  Surgeon: Manya Silvas, MD;  Location[facility] ENDOSCOPY;  Service: Endoscopy;  Laterality: N/A;   ESOPHAGOGASTRODUODENOSCOPY (EGD) WITH PROPOFOL N/A 12/08/2016   Procedure: ESOPHAGOGASTRODUODENOSCOPY (EGD) WITH PROPOFOL;  Surgeon: Manya Silvas, MD;  Location: [facility] ENDOSCOPY;  Service: Endoscopy;  Laterality: N/A;   KNEE ARTHROSCOPY Left 2005   LOWER EXTREMITY ANGIOGRAPHY Left 08/12/2021   Procedure: Lower Extremity Angiography;  Surgeon: Katha Cabal, MD;  Location: [place] [facility];  Service: Cardiovascular;  Laterality: Left;   TEE WITHOUT CARDIOVERSION N/A 01/31/2015   Procedure: TRANSESOPHAGEAL ECHOCARDIOGRAM (TEE);  Surgeon: Lelon Perla, MD;  Location: [facility]COPY;  Service: Cardiovascular;  Laterality: N/A;   TEMPORARY DIALYSIS CATHETER N/A 08/14/2021   Procedure: TEMPORARY DIALYSIS CATHETER;  Surgeon: Katha Cabal, MD;  Location: [place] [facility];  Service: Cardiovascular;  Laterality: N/A;   TUBAL LIGATION  1978   ULNAR NERVE TRANSPOSITION  2008    Family Psychiatric History: Please see initial evaluation for full details. I have reviewed the history. No updates at this time.     Family History:  Family History  Problem Relation Age of Onset   Heart disease Mother        died from CHF   Asthma Mother    Diabetes Mother    Heart disease Father    Aneurysm Father    COPD Brother    Diabetes Brother    Alcohol abuse Paternal Aunt    Anemia Neg Hx    Arrhythmia Neg Hx    Clotting disorder Neg Hx    Fainting Neg Hx    Heart attack Neg Hx    Heart failure Neg Hx    Hyperlipidemia Neg Hx    Hypertension Neg Hx     Social  History:  Social History   Socioeconomic History   Marital status: Married    Spouse name: Not on file   Number of children: 2   Years of education: Not on file   Highest education level: Bachelor's degree (e.g., BA, AB, BS)  Occupational History   Occupation: disabled    Comment: retired  Tobacco Use   Smoking status: Former    Packs/day: 2.00    Years: 36.00    Total pack years: 72.00    Types: Cigarettes    Start date: 02/08/1974    Quit date: 03/11/2021    Years since quitting: 0.5   Smokeless tobacco: Never   Tobacco comments:    Quit 03/2021  Vaping Use   Vaping Use: Never used  Substance and Sexual Activity   Alcohol use: No    Alcohol/week: 0.0 standard drinks of alcohol  Drug use: No   Sexual activity: Yes    Birth control/protection: None  Other Topics Concern   Not on file  Social History Narrative   Lives at home with stepson and husband, dogs and cats   Caffeine  Drinks sweet tea.   Right handed.    Social Determinants of Health   Financial Resource Strain: Low Risk  (10/07/2021)   Overall Financial Resource Strain (CARDIA)    Difficulty of Paying Living Expenses: Not hard at all  Food Insecurity: No Food Insecurity (05/27/2021)   Hunger Vital Sign    Worried About Running Out of Food in the Last Year: Never true    Ran Out of Food in the Last Year: Never true  Transportation Needs: No Transportation Needs (10/07/2021)   PRAPARE - Hydrologist (Medical): No    Lack of Transportation (Non-Medical): No  Physical Activity: Inactive (05/27/2021)   Exercise Vital Sign    Days of Exercise per Week: 0 days    Minutes of Exercise per Session: 0 min  Stress: No Stress Concern Present (05/27/2021)   [place]    Feeling of Stress : Not at all  Social Connections: Moderately Isolated (05/27/2021)   Social Connection and Isolation Panel [NHANES]    Frequency of  Communication with Friends and Family: More than three times a week    Frequency of Social Gatherings with Friends and Family: Once a week    Attends Religious Services: Never    Marine scientist or Organizations: No    Attends Archivist Meetings: Never    Marital Status: Married    Allergies:  Allergies  Allergen Reactions   Codeine Nausea And Vomiting    Metabolic Disorder Labs: Lab Results  Component Value Date   HGBA1C 7.9 (A) 07/14/2021   MPG 246 05/31/2020   MPG 303.44 10/06/2017   No results found for: "PROLACTIN" Lab Results  Component Value Date   CHOL 271 (H) 10/20/2020   TRIG 231 (H) 10/20/2020   HDL 41 10/20/2020   CHOLHDL 6.6 (H) 10/20/2020   VLDL 64 (H) 10/06/2017   LDLCALC 186 (H) 10/20/2020   LDLCALC 70 07/10/2020   Lab Results  Component Value Date   TSH 2.977 10/06/2021    Therapeutic Level Labs: No results found for: "LITHIUM" No results found for: "VALPROATE" No results found for: "CBMZ"  Current Medications: Current Outpatient Medications  Medication Sig Dispense Refill   albuterol (VENTOLIN HFA) 108 (90 Base) MCG/ACT inhaler TAKE 2 PUFFS BY MOUTH EVERY 6 HOURS AS NEEDED FOR WHEEZE OR SHORTNESS OF BREATH 8.5 each 2   apixaban (ELIQUIS) 5 MG TABS tablet Take 1 tablet (5 mg total) by mouth 2 (two) times daily. 180 tablet 3   atorvastatin (LIPITOR) 40 MG tablet Take 1 tablet (40 mg total) by mouth daily. 90 tablet 3   carvedilol (COREG) 12.5 MG tablet Take 1 tablet (12.5 mg total) by mouth 2 (two) times daily with a meal. 60 tablet 3   clopidogrel (PLAVIX) 75 MG tablet Take 1 tablet (75 mg total) by mouth daily. 30 tablet 0   Dulaglutide 3 MG/0.5ML SOPN Inject into the skin once a week.     fluticasone (FLONASE) 50 MCG/ACT nasal spray Place 2 sprays into both nostrils daily. 16 g 6   gabapentin (NEURONTIN) 300 MG capsule Take 300 mg by mouth 2 (two) times daily.     glucose blood test  strip OneTouch Verio strips     insulin  aspart (NOVOLOG) 100 UNIT/ML injection Insulin pump     Insulin Disposable Pump (OMNIPOD DASH PODS, GEN 4,) MISC USE 1 POD EACH EVERY 72    HOURS     Insulin Human (INSULIN PUMP) SOLN Per endocrinoloy     loratadine (CLARITIN) 10 MG tablet Take 1 tablet by mouth daily.     losartan (COZAAR) 25 MG tablet Take 25 mg by mouth 2 (two) times daily.     oxyCODONE (OXY IR/ROXICODONE) 5 MG immediate release tablet Take 1 tablet (5 mg total) by mouth every 8 (eight) hours as needed for severe pain. 15 tablet 0   pantoprazole (PROTONIX) 40 MG tablet Take 1 tablet (40 mg total) by mouth 2 (two) times daily. 60 tablet 0   [START ON 10/12/2021] PARoxetine (PAXIL) 40 MG tablet Take 1 tablet (40 mg total) by mouth daily. 90 tablet 1   silver sulfADIAZINE (SILVADENE) 1 % cream Apply 1 Application topically daily. 400 g 0   Spacer/Aero-Holding Chambers [facility] DIAMOND) MISC Use with inhaler to ensure medication is delivered throughout lungs 1 each 0   torsemide (DEMADEX) 20 MG tablet Take 1 tablet (20 mg total) by mouth every Monday, Wednesday, and Friday. 30 tablet 0   traMADol (ULTRAM) 50 MG tablet Take 50 mg by mouth 2 (two) times daily as needed.     Vitamin D, Ergocalciferol, (DRISDOL) 1.25 MG (50000 UT) CAPS capsule Take 50,000 Units by mouth once a week. (Sundays)     No current facility-administered medications for this visit.     Musculoskeletal: Strength & Muscle Tone:  N/A Gait & Station:  N/A Patient leans: N/A  Psychiatric Specialty Exam: Review of Systems  Psychiatric/Behavioral:  Positive for dysphoric mood and sleep disturbance. Negative for agitation, behavioral problems, confusion, decreased concentration, hallucinations, self-injury and suicidal ideas. The patient is nervous/anxious. The patient is not hyperactive.   All other systems reviewed and are negative.   There were no vitals taken for this visit.There is no height or weight on file to calculate BMI.  General Appearance:  Fairly Groomed  Eye Contact:  Good  Speech:  Clear and Coherent  Volume:  Normal  Mood:   better  Affect:  Appropriate, Congruent, and calm  Thought Process:  Coherent  Orientation:  Full (Time, Place, and Person)  Thought Content: Logical   Suicidal Thoughts:  No  Homicidal Thoughts:  No  Memory:  Immediate;   Good  Judgement:  Good  Insight:  Good  Psychomotor Activity:  Normal  Concentration:  Concentration: Good and Attention Span: Good  Recall:  Good  Fund of Knowledge: Good  Language: Good  Akathisia:  No  Handed:  Right  AIMS (if indicated): not done  Assets:  Communication Skills Desire for Improvement  ADL's:  Intact  Cognition: WNL  Sleep:  Fair   Screenings: PHQ2-9    [place] Office Visit from 09/02/2021 in [place] Visit from 07/14/2021 in [place] from 05/27/2021 in [place] Visit from 04/15/2021 in [place] Office Visit from 04/13/2021 in [place]  PHQ-2 Total Score 0 1 0 0 2  PHQ-9 Total Score '1 12 3 '$ -- 6      Flowsheet Row Admission (Discharged) from 08/12/2021 in [place] [facility] Office Visit from 04/13/2021 in [place] ED to Hosp-Admission (Discharged) from 05/31/2020 in [place]  CENTER 1C MEDICAL TELEMETRY  C-SSRS RISK CATEGORY No Risk No Risk No Risk        Assessment and Plan:  Jocelyn Wells is a 66 y.o. year old female with a history of depression, type II DM, hypertension, hyperlipidemia, non ischemic cardiomyopathy,  CKD stage IV, cryptogenic stroke, COPD, sleep apnea, back pain with T12-L1 compression fracture, T7-8 disc herniation with some spinal cord compression with no clinical myelopathy , who presents for follow up appointment for below.   1. MDD (major depressive disorder), recurrent, in partial remission ([place]) 2. PTSD  (post-traumatic stress disorder) Although she reports worsening in depressive symptoms and anxiety in the context of recent admission, it has been improving since discharge.  Other psychosocial stressors includes unemployment, medical history of chronic pain and stroke.  Will continue Paxil at the current dose to target depression and PTSD.  Noted that she has been on this medication for many years without significant side effect.    # Insomnia Worsening. She reports history of snoring, daytime fatigue and insomnia.  Although she was strongly recommended to do a sleep evaluation, she declined this, referring to the evaluation she had few years ago.  Will continue to monitor.      Plan Continue Paxil 40 mg daily Next appointment- 812/6 at 8 AM, video -on tramadol. Will monitor for serotonin syndrome   The patient demonstrates the following risk factors for suicide: Chronic risk factors for suicide include: psychiatric disorder of depression and chronic pain. Acute risk factors for suicide include: unemployment. Protective factors for this patient include: positive social support, coping skills, and hope for the future. Considering these factors, the overall suicide risk at this point appears to be low. Patient is appropriate for outpatient follow up.      This clinician has discussed the side effect associated with medication prescribed during this encounter. Please refer to notes in the previous encounters for more details.          Collaboration of Care: Collaboration of Care: Other reviewed notes in Epic  Patient/Guardian was advised Release of Information must be obtained prior to any record release in order to collaborate their care with an outside provider. Patient/Guardian was advised if they have not already done so to contact the registration department to sign all necessary forms in order for [place] to release information regarding their care.   Consent: Patient/Guardian gives verbal  consent for treatment and assignment of benefits for services provided during this visit. Patient/Guardian expressed understanding and agreed to proceed.    Norman Clay, MD 10/07/2021, 1:26 PM

## 2021-10-06 NOTE — Telephone Encounter (Signed)
pt called left message that she had ? about appt.

## 2021-10-06 NOTE — Telephone Encounter (Signed)
left message that her appt was for tomorrow 10-07-21 @ 1:00 with dr. Modesta Messing. and if she had anymore question to pleae call our office back.

## 2021-10-06 NOTE — Patient Instructions (Signed)
We are going to get oxygen level during sleep.  We are going to order some breathing tests.  We have ordered some blood work to check on your thyroid gland.  We will see you in follow-up in 2 to 3 months time call sooner should any new problems arise.

## 2021-10-06 NOTE — Progress Notes (Addendum)
Subjective:    Patient ID: Jocelyn Wells, female    DOB: [birth year], 66 y.o.   MRN: [id number] Patient Care Team: Gwyneth Sprout, FNP as PCP - General (Family Medicine) Gabriel Carina Betsey Holiday, MD as Physician Assistant (Endocrinology) Deboraha Sprang, MD as Consulting Physician (Cardiology) Rainey Pines, MD as Referring Physician (Psychiatry) Ralene Bathe, MD as Consulting Physician (Dermatology) Manya Silvas, MD (Inactive) as Consulting Physician (Gastroenterology) Dimmig, Marcello Moores, MD as Referring Physician (Orthopedic Surgery) Schnier, Dolores Lory, MD (Vascular Surgery) Lynnell Dike, OD (Optometry) Jannifer Franklin, NP as Nurse Practitioner (Neurology) Germaine Pomfret, [facility] (Pharmacist)  Chief Complaint  Patient presents with   Consult   HPI This is a 66 year old former smoker (quit 03/2020, 44 PY) with a history as noted below, presents for evaluation of potential COPD.  She is kindly referred by Tally Joe, [place].  The patient states that she was admitted on July 5 through July 16 at [facility] for issues with peripheral vascular disease and requiring stents in her lower extremities.  During that admission she was noted to have worsening of her tenuous renal function.  She developed an episode of acute renal failure superimposed on stage IV chronic kidney disease likely triggered by contrast dye.  Her hospital course was further complicated by an acute upper GI bleed and acute on chronic diastolic heart failure.  She also developed issues with volume overload and had transient respiratory failure with hypoxia.  She states that during that hospitalization she was told she had COPD but she notes that she has never been treated for it.  She is only on albuterol as needed and very rarely has to use it.  Currently does not endorse any shortness of breath at rest though she has experienced dyspnea on heavy exertion for a number of years.  She does experience fatigue.  She does note that she had COVID  in January of this year but did not require hospitalization.  She does not endorse any cough or sputum production.  She does get lower extremity edema.  She also has chronic wounds due to stasis changes.  Most recently has had a lower extremity wound on the left for which she is going to wound therapy for.  This is currently wrapped and bandaged.  She does not use any inhalers, was provided an albuterol inhaler after hospitalization but has not needed it.  She has had no fevers, chills or sweats.  She has had no cough or sputum production.  No hemoptysis.  No orthopnea or paroxysmal nocturnal dyspnea.  Of note a low-dose CT chest for lung cancer screening performed in 2019 did not show any nodules of concern.  The patient does have centrilobular emphysema.  These changes were noted to be "subtle".  Patient had a prior split-night sleep study in March 2017 in [place] which was negative for obstructive sleep apnea however the patient did exhibit significant nocturnal hypoxemia during the study.  Review of Systems A 10 point review of systems was performed and it is as noted above otherwise negative.  Past Medical History:  Diagnosis Date   Anxiety    Cardiomyopathy [facility])    new to her Jan 2017   Chronic kidney disease    COPD (chronic obstructive pulmonary disease) (HCC)    Depression    Diabetes mellitus, type II (HCC)    HTN (hypertension)    Obesity    Osteoporosis    PONV (postoperative nausea and vomiting)  Stroke [facility])    Jan 2017   Past Surgical History:  Procedure Laterality Date   ABDOMINAL HYSTERECTOMY     ANKLE FRACTURE SURGERY Left 2002   BILATERAL SALPINGOOPHORECTOMY  2000   CARDIAC CATHETERIZATION N/A 03/25/2015   Procedure: Right/Left Heart Cath and Coronary Angiography;  Surgeon: Belva Crome, MD;  Location: [place] CV LAB;  Service: Cardiovascular;  Laterality: N/A;   CESAREAN SECTION  1978   COLONOSCOPY WITH PROPOFOL N/A 09/22/2016   Procedure: COLONOSCOPY  WITH PROPOFOL;  Surgeon: Manya Silvas, MD;  Location: [facility] ENDOSCOPY;  Service: Endoscopy;  Laterality: N/A;   EP IMPLANTABLE DEVICE N/A 08/05/2015   Procedure: Loop Recorder Insertion;  Surgeon: Deboraha Sprang, MD;  Location: [place] CV LAB;  Service: Cardiovascular;  Laterality: N/A;   ESOPHAGOGASTRODUODENOSCOPY (EGD) WITH PROPOFOL N/A 09/22/2016   Procedure: ESOPHAGOGASTRODUODENOSCOPY (EGD) WITH PROPOFOL;  Surgeon: Manya Silvas, MD;  Location: [facility] ENDOSCOPY;  Service: Endoscopy;  Laterality: N/A;   ESOPHAGOGASTRODUODENOSCOPY (EGD) WITH PROPOFOL N/A 12/08/2016   Procedure: ESOPHAGOGASTRODUODENOSCOPY (EGD) WITH PROPOFOL;  Surgeon: Manya Silvas, MD;  Location: [facility] ENDOSCOPY;  Service: Endoscopy;  Laterality: N/A;   KNEE ARTHROSCOPY Left 2005   LOWER EXTREMITY ANGIOGRAPHY Left 08/12/2021   Procedure: Lower Extremity Angiography;  Surgeon: Katha Cabal, MD;  Location: [place] CV LAB;  Service: Cardiovascular;  Laterality: Left;   TEE WITHOUT CARDIOVERSION N/A 01/31/2015   Procedure: TRANSESOPHAGEAL ECHOCARDIOGRAM (TEE);  Surgeon: Lelon Perla, MD;  Location: [facility] ENDOSCOPY;  Service: Cardiovascular;  Laterality: N/A;   TEMPORARY DIALYSIS CATHETER N/A 08/14/2021   Procedure: TEMPORARY DIALYSIS CATHETER;  Surgeon: Katha Cabal, MD;  Location: [place] CV LAB;  Service: Cardiovascular;  Laterality: N/A;   TUBAL LIGATION  1978   ULNAR NERVE TRANSPOSITION  2008   Patient Active Problem List   Diagnosis Date Noted   Hospital discharge follow-up 09/02/2021   Heart failure ([place]) 09/02/2021   Hyperkalemia 08/15/2021   GI bleed 08/14/2021   Acute respiratory failure with hypoxia ([place]) 08/14/2021   Acute on chronic diastolic CHF (congestive heart failure) ([place]) 08/14/2021   Atherosclerosis of native arteries of the extremities with ulceration ([place]) 08/12/2021   Positive depression screening 07/14/2021   DOE (dyspnea on exertion) 07/14/2021   Bilateral leg  edema 07/14/2021   Fall 04/13/2021   Anemia in chronic kidney disease 03/18/2021   Heart failure, unspecified ([place]) 03/18/2021   Hyperparathyroidism due to renal insufficiency ([place]) 03/18/2021   Chronic depression 03/18/2021   Overweight 03/18/2021   Poor appetite 02/13/2021   MDD (major depressive disorder), recurrent episode, mild ([place]) 02/13/2021   Benign hypertensive kidney disease with chronic kidney disease 10/14/2020   CKD (chronic kidney disease), stage IV ([place]) 05/31/2020   Compression fracture of L1 lumbar vertebra ([place]) 01/30/2019   Compression fracture of body of thoracic vertebra ([place]) 01/30/2019   Lumbar foraminal stenosis (RIGHT L1) 01/30/2019   Spinal stenosis of lumbar region with neurogenic claudication (L4-L5) 01/30/2019   Osteoporosis, post-menopausal 04/14/2018   Vitamin D deficiency 04/14/2018   Chronic deep vein thrombosis (DVT) ([place]) 02/09/2018   Post-menopausal 11/17/2017   Aortic atherosclerosis ([place]) 11/08/2017   Snoring 04/24/2015   Sleep paralysis, recurrent isolated 04/24/2015   Hypersomnia with sleep apnea 04/24/2015   Cataplexy 04/24/2015   Morbid obesity due to excess calories ([place]) 04/24/2015   COPD (chronic obstructive pulmonary disease) ([place]) 85/03/7739   Embolic stroke ([place]) 28/78/6767   Acute renal failure superimposed on stage 4 chronic kidney disease ([place]) 01/29/2015  Dizziness and giddiness 07/05/2014   Hypertension associated with diabetes ([place]) 07/05/2014   Carbuncle and furuncle 07/05/2014   Pain of perianal area 07/05/2014   Guttate psoriasis 07/05/2014   H/O: osteoarthritis 06/05/2014   Anxiety, generalized 06/05/2014   Microalbuminuria 12/23/2013   Long term current use of insulin ([place]) 12/23/2013   Compulsive tobacco user syndrome 12/23/2013   Type 2 diabetes mellitus with other diabetic kidney complication ([place]) 24/23/5361   Disturbance of skin sensation 09/02/2006   Difficulty hearing 07/25/2006   Cephalalgia 05/27/2006    Hyperlipidemia associated with type 2 diabetes mellitus ([place]) 10/22/2005   Arthritis, degenerative 10/22/2005   Family History  Problem Relation Age of Onset   Heart disease Mother        died from CHF   Asthma Mother    Diabetes Mother    Heart disease Father    Aneurysm Father    COPD Brother    Diabetes Brother    Alcohol abuse Paternal Aunt    Anemia Neg Hx    Arrhythmia Neg Hx    Clotting disorder Neg Hx    Fainting Neg Hx    Heart attack Neg Hx    Heart failure Neg Hx    Hyperlipidemia Neg Hx    Hypertension Neg Hx    Social History   Tobacco Use   Smoking status: Former    Packs/day: 2.00    Years: 36.00    Total pack years: 72.00    Types: Cigarettes    Start date: 02/08/1974    Quit date: 03/11/2020    Years since quitting: 1.5   Smokeless tobacco: Never   Tobacco comments:    Quit 03/2020  Substance Use Topics   Alcohol use: No    Alcohol/week: 0.0 standard drinks of alcohol   Allergies  Allergen Reactions   Codeine Nausea And Vomiting   Current Meds  Medication Sig   albuterol (VENTOLIN HFA) 108 (90 Base) MCG/ACT inhaler TAKE 2 PUFFS BY MOUTH EVERY 6 HOURS AS NEEDED FOR WHEEZE OR SHORTNESS OF BREATH   apixaban (ELIQUIS) 5 MG TABS tablet Take 1 tablet (5 mg total) by mouth 2 (two) times daily.   atorvastatin (LIPITOR) 40 MG tablet Take 1 tablet (40 mg total) by mouth daily.   carvedilol (COREG) 12.5 MG tablet Take 1 tablet (12.5 mg total) by mouth 2 (two) times daily with a meal.   Dulaglutide 3 MG/0.5ML SOPN Inject into the skin once a week.   fluticasone (FLONASE) 50 MCG/ACT nasal spray Place 2 sprays into both nostrils daily.   gabapentin (NEURONTIN) 300 MG capsule Take 300 mg by mouth 2 (two) times daily.   glucose blood test strip OneTouch Verio strips   insulin aspart (NOVOLOG) 100 UNIT/ML injection Insulin pump   Insulin Disposable Pump (OMNIPOD DASH PODS, GEN 4,) MISC USE 1 POD EACH EVERY 72    HOURS   Insulin Human (INSULIN PUMP) SOLN Per  endocrinoloy   loratadine (CLARITIN) 10 MG tablet Take 1 tablet by mouth daily.   losartan (COZAAR) 25 MG tablet Take 25 mg by mouth 2 (two) times daily.   oxyCODONE (OXY IR/ROXICODONE) 5 MG immediate release tablet Take 1 tablet (5 mg total) by mouth every 8 (eight) hours as needed for severe pain. (Patient not taking: Reported on 10/23/2021)   pantoprazole (PROTONIX) 40 MG tablet Take 1 tablet (40 mg total) by mouth 2 (two) times daily.   silver sulfADIAZINE (SILVADENE) 1 % cream Apply 1 Application topically daily.  Spacer/Aero-Holding Chambers [facility] DIAMOND) MISC Use with inhaler to ensure medication is delivered throughout lungs   traMADol (ULTRAM) 50 MG tablet Take 50 mg by mouth 2 (two) times daily as needed.   Vitamin D, Ergocalciferol, (DRISDOL) 1.25 MG (50000 UT) CAPS capsule Take 50,000 Units by mouth once a week. (Sundays) (Patient not taking: Reported on 10/23/2021)   [DISCONTINUED] clopidogrel (PLAVIX) 75 MG tablet Take 1 tablet (75 mg total) by mouth daily.   [DISCONTINUED] PARoxetine (PAXIL) 40 MG tablet Take 1 tablet (40 mg total) by mouth daily.   [DISCONTINUED] torsemide (DEMADEX) 20 MG tablet Take 1 tablet (20 mg total) by mouth every Monday, Wednesday, and Friday. (Patient not taking: Reported on 10/23/2021)   Immunization History  Administered Date(s) Administered   Influenza Inj Mdck Quad Pf 12/20/2018   Influenza Split 01/05/2006, 10/31/2007, 03/03/2009, 12/16/2009, 12/23/2010, 12/24/2011, 12/19/2013   Influenza,inj,Quad PF,6+ Mos 11/23/2012, 11/07/2015, 11/15/2017   Influenza-Unspecified 11/07/2015, 11/03/2016   PFIZER(Purple Top)SARS-COV-2 Vaccination 03/01/2019, 03/22/2019   Pneumococcal Polysaccharide-23 12/23/2010   Zoster Recombinat (Shingrix) 06/03/2016, 12/11/2016      Objective:   Physical Exam BP 124/80 (BP Location: Left Arm, Patient Position: Sitting, Cuff Size: Large)   Pulse 74   Temp 97.7 F (36.5 C) (Oral)   Ht '5\' 6"'$  (1.676 m)   Wt 230 lb  9.6 oz (104.6 kg)   SpO2 (!) 89%   BMI 37.22 kg/m  GENERAL: Obese woman, sallow appearance, presents in transport chair.  No respiratory distress.  Initial oxygen saturations at 89%, 97% after settling in room.   HEAD: Normocephalic, atraumatic.  EYES: Pupils equal, round, reactive to light.  No scleral icterus.  MOUTH: Edentulous, oral mucosa moist.  No thrush. NECK: Supple. No thyromegaly. Trachea midline. No JVD.  No adenopathy. PULMONARY: Good air entry bilaterally.  No adventitious sounds. CARDIOVASCULAR: S1 and S2. Regular rate and rhythm.  ABDOMEN: Obese otherwise benign. MUSCULOSKELETAL: No joint deformity, no clubbing, 2+ edema of the lower extremities.  NEUROLOGIC: Grossly nonfocal. SKIN: Intact,warm,dry.  Chronic stasis changes lower extremities wound on the shin of left leg currently wrapped. PSYCH: Flat affect.  Ambulatory oximetry was performed today: Study was limited due to patient complaining of back pain and left lower extremity pain (wound).  At baseline the patient had a resting heart rate of 66 bpm resting O2 sat was 97%, after 250 feet ambulation heart rate was 80 bpm oxygen saturation 91%.     Assessment & Plan:     ICD-10-CM   1. SOB (shortness of breath) on exertion  R06.02 Pulmonary Function Test [facility] Only    Pulse oximetry, overnight   Will obtain PFTs to better clarify however suspect this is multifactorial Cardiac, renal issues and obesity with deconditioning add to issue ONOX    2. COPD suggested by initial evaluation ([place])  J44.9    Obtain PFTs From the COPD standpoint she has low symptom burden    3. Other fatigue  R53.83 T4    TSH   Check thyroid panel     Orders Placed This Encounter  Procedures   T4    Standing Status:   Future    Number of Occurrences:   1    Standing Expiration Date:   10/07/2022   TSH    Standing Status:   Future    Number of Occurrences:   1    Standing Expiration Date:   10/07/2022   Pulmonary Function Test [facility]  Only    Standing Status:   Future  Standing Expiration Date:   10/07/2022    Order Specific Question:   Full PFT: includes the following: basic spirometry, spirometry pre & post bronchodilator, diffusion capacity (DLCO), lung volumes    Answer:   Full PFT   Pulse oximetry, overnight    Standing Status:   Future    Standing Expiration Date:   10/07/2022    Scheduling Instructions:     On RA   We will see the patient in follow-up in 2 to 3 months time she is to contact us prior to that time should any new difficulties arise.  Renold Don, MD Advanced Bronchoscopy PCCM [facility]    *This note was dictated using voice recognition software/Dragon.  Despite best efforts to proofread, errors can occur which can change the meaning. Any transcriptional errors that result from this process are unintentional and may not be fully corrected at the time of dictation.

## 2021-10-07 ENCOUNTER — Telehealth (INDEPENDENT_AMBULATORY_CARE_PROVIDER_SITE_OTHER): Payer: Medicare Other | Admitting: Psychiatry

## 2021-10-07 ENCOUNTER — Encounter: Payer: Self-pay | Admitting: Psychiatry

## 2021-10-07 DIAGNOSIS — I110 Hypertensive heart disease with heart failure: Secondary | ICD-10-CM | POA: Diagnosis not present

## 2021-10-07 DIAGNOSIS — E1122 Type 2 diabetes mellitus with diabetic chronic kidney disease: Secondary | ICD-10-CM | POA: Diagnosis not present

## 2021-10-07 DIAGNOSIS — Z48812 Encounter for surgical aftercare following surgery on the circulatory system: Secondary | ICD-10-CM | POA: Diagnosis not present

## 2021-10-07 DIAGNOSIS — F4310 Post-traumatic stress disorder, unspecified: Secondary | ICD-10-CM

## 2021-10-07 DIAGNOSIS — F3341 Major depressive disorder, recurrent, in partial remission: Secondary | ICD-10-CM

## 2021-10-07 DIAGNOSIS — G4700 Insomnia, unspecified: Secondary | ICD-10-CM | POA: Diagnosis not present

## 2021-10-07 DIAGNOSIS — I5033 Acute on chronic diastolic (congestive) heart failure: Secondary | ICD-10-CM | POA: Diagnosis not present

## 2021-10-07 DIAGNOSIS — E1151 Type 2 diabetes mellitus with diabetic peripheral angiopathy without gangrene: Secondary | ICD-10-CM | POA: Diagnosis not present

## 2021-10-07 DIAGNOSIS — N184 Chronic kidney disease, stage 4 (severe): Secondary | ICD-10-CM | POA: Diagnosis not present

## 2021-10-07 LAB — T4: T4, Total: 7.2 ug/dL (ref 4.5–12.0)

## 2021-10-07 MED ORDER — PAROXETINE HCL 40 MG PO TABS: 40.0000 mg | ORAL_TABLET | Freq: Every day | ORAL | 1 refills | Status: DC

## 2021-10-07 NOTE — Patient Instructions (Signed)
Visit Information It was great speaking with you today!  Please let me know if you have any questions about our visit.  Patient Care Plan: General Pharmacy (Adult)     Problem Identified: Hypertension, Hyperlipidemia, Diabetes, Heart Failure, Coronary Artery Disease, COPD, Chronic Kidney Disease, Depression, Anxiety, Osteoporosis, Tobacco use, and History of stroke   Priority: High     Long-Range Goal: Patient-Specific Goal   Start Date: 10/07/2021  Expected End Date: 10/08/2022  This Visit's Progress: On track  Priority: High  Note:   Current Barriers:  No barriers noted  Pharmacist Clinical Goal(s):  Patient will maintain control of heart failure as evidenced by lack of exacerbations  through collaboration with PharmD and provider.   Interventions: 1:1 collaboration with Gwyneth Sprout, FNP regarding development and update of comprehensive plan of care as evidenced by provider attestation and co-signature Inter-disciplinary care team collaboration (see longitudinal plan of care) Comprehensive medication review performed; medication list updated in electronic medical record  Heart Failure (Goal: manage symptoms and prevent exacerbations) -Controlled -Last ejection fraction: 60-65% (Date: 2019) -HF type: HF w/recovered EF  -NYHA Class: II (slight limitation of activity) -Current treatment: Carvedilol 12.5 mg twice daily  Torsemide 20 mg every Mon/Wed/Fri  -Medications previously tried: HCTZ, Losartan (AKI), Metoprolol, Spironolactone  -Recommended to continue current medication  Hyperlipidemia: (LDL goal < 55) -Controlled -Current treatment: Atorvastatin 40 mg daily  -Current treatment: Clopidogrel 75 mg daily  -Medications previously tried: NA  -Recommended to continue current medication  Diabetes (A1c goal <8%) -Uncontrolled -Managed by Dr. Gabriel Carina  -Current medications: Trulicity 3 mg weekly on Mondays  Novolog via insulin pump  -Medications previously tried:  Ozempic (nausea), glyburide/metformin (CKD),   -Current home glucose readings (uses freestyle [place])  -Denies hypoglycemic/hyperglycemic symptoms -Recommended to continue current medication  COPD (Goal: control symptoms and prevent exacerbations) -Controlled -Current treatment  Albuterol 2 puffs every 6 hours as needed  Flonase  Loratadine 10 mg daily  -Medications previously tried: NA  -Frequency of rescue inhaler use: <1x in past month.  -Recommended to continue current medication  Depression/Anxiety (Goal: Maintain symptom remission) -Controlled -Current treatment: Paroxetine 40 mg daily  -Medications previously tried/failed: Aripiprazole  -Recommended to continue current medication  Chronic DVTs (Goal: Prevent clots) -Controlled -Current treatment  Eliquis 5 mg twice daily  -Medications previously tried: NA  -Recommended to continue current medication  Chronic Kidney Disease Stage 4  -All medications assessed for renal dosing and appropriateness in chronic kidney disease. -told by Dr. Lanora Manis 4 glasses of water daily.  -Drinks unsweetened tea 4-5 glasses. 2-3 of water.   -Recommended to continue current medication  Patient Goals/Self-Care Activities Patient will:  - weigh daily, and contact provider if weight gain of greater than 2 pounds in 24 hours  Follow Up Plan: Telephone follow up appointment with care management team member scheduled for:  10/16/2021 at 1:00 PM    Ms. Milson was given information about Chronic Care Management services today including:  CCM service includes personalized support from designated clinical staff supervised by her physician, including individualized plan of care and coordination with other care providers 24/7 contact phone numbers for assistance for urgent and routine care needs. Standard insurance, coinsurance, copays and deductibles apply for chronic care management only during months in which we provide at least 20 minutes of  these services. Most insurances cover these services at 100%, however patients may be responsible for any copay, coinsurance and/or deductible if applicable. This service may help you avoid the need for  more expensive face-to-face services. Only one practitioner may furnish and bill the service in a calendar month. The patient may stop CCM services at any time (effective at the end of the month) by phone call to the office staff.  Patient agreed to services and verbal consent obtained.   Patient verbalizes understanding of instructions and care plan provided today and agrees to view in [place]. Active MyChart status and patient understanding of how to access instructions and care plan via MyChart confirmed with patient.     Junius Argyle, PharmD, [place], CPP  Clinical Pharmacist Practitioner  [facility] [phone]

## 2021-10-08 DIAGNOSIS — N184 Chronic kidney disease, stage 4 (severe): Secondary | ICD-10-CM

## 2021-10-08 DIAGNOSIS — J449 Chronic obstructive pulmonary disease, unspecified: Secondary | ICD-10-CM | POA: Diagnosis not present

## 2021-10-08 DIAGNOSIS — E1151 Type 2 diabetes mellitus with diabetic peripheral angiopathy without gangrene: Secondary | ICD-10-CM | POA: Diagnosis not present

## 2021-10-08 DIAGNOSIS — I5033 Acute on chronic diastolic (congestive) heart failure: Secondary | ICD-10-CM | POA: Diagnosis not present

## 2021-10-08 DIAGNOSIS — E1159 Type 2 diabetes mellitus with other circulatory complications: Secondary | ICD-10-CM | POA: Diagnosis not present

## 2021-10-08 DIAGNOSIS — I509 Heart failure, unspecified: Secondary | ICD-10-CM

## 2021-10-08 DIAGNOSIS — I110 Hypertensive heart disease with heart failure: Secondary | ICD-10-CM | POA: Diagnosis not present

## 2021-10-08 DIAGNOSIS — E1122 Type 2 diabetes mellitus with diabetic chronic kidney disease: Secondary | ICD-10-CM | POA: Diagnosis not present

## 2021-10-08 DIAGNOSIS — Z794 Long term (current) use of insulin: Secondary | ICD-10-CM

## 2021-10-08 DIAGNOSIS — Z48812 Encounter for surgical aftercare following surgery on the circulatory system: Secondary | ICD-10-CM | POA: Diagnosis not present

## 2021-10-08 DIAGNOSIS — E785 Hyperlipidemia, unspecified: Secondary | ICD-10-CM

## 2021-10-09 ENCOUNTER — Encounter: Payer: Self-pay | Admitting: Pulmonary Disease

## 2021-10-09 DIAGNOSIS — E1122 Type 2 diabetes mellitus with diabetic chronic kidney disease: Secondary | ICD-10-CM | POA: Diagnosis not present

## 2021-10-09 DIAGNOSIS — N184 Chronic kidney disease, stage 4 (severe): Secondary | ICD-10-CM | POA: Diagnosis not present

## 2021-10-09 DIAGNOSIS — I110 Hypertensive heart disease with heart failure: Secondary | ICD-10-CM | POA: Diagnosis not present

## 2021-10-09 DIAGNOSIS — I5033 Acute on chronic diastolic (congestive) heart failure: Secondary | ICD-10-CM | POA: Diagnosis not present

## 2021-10-09 DIAGNOSIS — E1151 Type 2 diabetes mellitus with diabetic peripheral angiopathy without gangrene: Secondary | ICD-10-CM | POA: Diagnosis not present

## 2021-10-09 DIAGNOSIS — Z48812 Encounter for surgical aftercare following surgery on the circulatory system: Secondary | ICD-10-CM | POA: Diagnosis not present

## 2021-10-13 DIAGNOSIS — N184 Chronic kidney disease, stage 4 (severe): Secondary | ICD-10-CM | POA: Diagnosis not present

## 2021-10-13 DIAGNOSIS — I5033 Acute on chronic diastolic (congestive) heart failure: Secondary | ICD-10-CM | POA: Diagnosis not present

## 2021-10-13 DIAGNOSIS — I110 Hypertensive heart disease with heart failure: Secondary | ICD-10-CM | POA: Diagnosis not present

## 2021-10-13 DIAGNOSIS — Z48812 Encounter for surgical aftercare following surgery on the circulatory system: Secondary | ICD-10-CM | POA: Diagnosis not present

## 2021-10-13 DIAGNOSIS — E1151 Type 2 diabetes mellitus with diabetic peripheral angiopathy without gangrene: Secondary | ICD-10-CM | POA: Diagnosis not present

## 2021-10-13 DIAGNOSIS — E1122 Type 2 diabetes mellitus with diabetic chronic kidney disease: Secondary | ICD-10-CM | POA: Diagnosis not present

## 2021-10-14 ENCOUNTER — Telehealth: Payer: Self-pay

## 2021-10-14 DIAGNOSIS — I10 Essential (primary) hypertension: Secondary | ICD-10-CM | POA: Diagnosis not present

## 2021-10-14 DIAGNOSIS — N184 Chronic kidney disease, stage 4 (severe): Secondary | ICD-10-CM | POA: Diagnosis not present

## 2021-10-14 DIAGNOSIS — F32A Depression, unspecified: Secondary | ICD-10-CM | POA: Diagnosis not present

## 2021-10-14 DIAGNOSIS — I509 Heart failure, unspecified: Secondary | ICD-10-CM | POA: Diagnosis not present

## 2021-10-14 DIAGNOSIS — I129 Hypertensive chronic kidney disease with stage 1 through stage 4 chronic kidney disease, or unspecified chronic kidney disease: Secondary | ICD-10-CM | POA: Diagnosis not present

## 2021-10-14 DIAGNOSIS — I639 Cerebral infarction, unspecified: Secondary | ICD-10-CM | POA: Diagnosis not present

## 2021-10-14 DIAGNOSIS — E1122 Type 2 diabetes mellitus with diabetic chronic kidney disease: Secondary | ICD-10-CM | POA: Diagnosis not present

## 2021-10-14 DIAGNOSIS — I110 Hypertensive heart disease with heart failure: Secondary | ICD-10-CM | POA: Diagnosis not present

## 2021-10-14 DIAGNOSIS — D631 Anemia in chronic kidney disease: Secondary | ICD-10-CM | POA: Diagnosis not present

## 2021-10-14 DIAGNOSIS — E1151 Type 2 diabetes mellitus with diabetic peripheral angiopathy without gangrene: Secondary | ICD-10-CM | POA: Diagnosis not present

## 2021-10-14 DIAGNOSIS — N2581 Secondary hyperparathyroidism of renal origin: Secondary | ICD-10-CM | POA: Diagnosis not present

## 2021-10-14 DIAGNOSIS — U071 COVID-19: Secondary | ICD-10-CM | POA: Diagnosis not present

## 2021-10-14 DIAGNOSIS — I5033 Acute on chronic diastolic (congestive) heart failure: Secondary | ICD-10-CM | POA: Diagnosis not present

## 2021-10-14 DIAGNOSIS — R809 Proteinuria, unspecified: Secondary | ICD-10-CM | POA: Diagnosis not present

## 2021-10-14 DIAGNOSIS — Z48812 Encounter for surgical aftercare following surgery on the circulatory system: Secondary | ICD-10-CM | POA: Diagnosis not present

## 2021-10-14 DIAGNOSIS — E663 Overweight: Secondary | ICD-10-CM | POA: Diagnosis not present

## 2021-10-14 NOTE — Progress Notes (Signed)
Chronic Care Management APPOINTMENT REMINDER   Jocelyn Wells was reminded to have all medications, supplements and any blood glucose and blood pressure readings available for review with Junius Argyle, Pharm. D, at her telephone visit on 10/16/2021 at 1:00 pm.  Patient confirm appointment.  [place] Pharmacist Assistant [phone]

## 2021-10-15 ENCOUNTER — Ambulatory Visit (INDEPENDENT_AMBULATORY_CARE_PROVIDER_SITE_OTHER): Payer: Medicare Other | Admitting: Vascular Surgery

## 2021-10-15 ENCOUNTER — Encounter (INDEPENDENT_AMBULATORY_CARE_PROVIDER_SITE_OTHER): Payer: Self-pay | Admitting: Vascular Surgery

## 2021-10-15 ENCOUNTER — Ambulatory Visit (INDEPENDENT_AMBULATORY_CARE_PROVIDER_SITE_OTHER): Payer: Medicare Other

## 2021-10-15 VITALS — BP 120/79 | HR 76 | Resp 17 | Ht 66.0 in | Wt 230.0 lb

## 2021-10-15 DIAGNOSIS — I152 Hypertension secondary to endocrine disorders: Secondary | ICD-10-CM | POA: Diagnosis not present

## 2021-10-15 DIAGNOSIS — N184 Chronic kidney disease, stage 4 (severe): Secondary | ICD-10-CM

## 2021-10-15 DIAGNOSIS — J449 Chronic obstructive pulmonary disease, unspecified: Secondary | ICD-10-CM

## 2021-10-15 DIAGNOSIS — I7025 Atherosclerosis of native arteries of other extremities with ulceration: Secondary | ICD-10-CM | POA: Diagnosis not present

## 2021-10-15 DIAGNOSIS — I509 Heart failure, unspecified: Secondary | ICD-10-CM

## 2021-10-15 DIAGNOSIS — I82511 Chronic embolism and thrombosis of right femoral vein: Secondary | ICD-10-CM | POA: Diagnosis not present

## 2021-10-15 DIAGNOSIS — E1159 Type 2 diabetes mellitus with other circulatory complications: Secondary | ICD-10-CM | POA: Diagnosis not present

## 2021-10-15 NOTE — Progress Notes (Signed)
MRN : [id number]  Jocelyn Wells is a 66 y.o. (16-Aug-1955) female who presents with chief complaint of check circulation.  History of Present Illness:  The patient returns to the office for followup and review status post angiogram with intervention on 08/12/2021.   Procedure:  Percutaneous transluminal angioplasty and stent placement left superficial       femoral artery and popliteal to 5 mm proximally and 4 mm distally. 2.    Percutaneous transluminal angioplasty of the left common iliac artery to 9 mm.   3.    Percutaneous transluminal angioplasty of the left peroneal artery to 3 mm.   4.    Mechanical thrombectomy left tibioperoneal trunk and peroneal.   The patient notes improvement in the lower extremity symptoms. No interval shortening of the patient's claudication distance or rest pain symptoms. She reports her left shin ulcer is improving.  No new ulcers or wounds have occurred since the last visit.  Her post angio course was complicated by acute renal failure ans she did require dialysis.  She has now returned to her baseline stage 4 renal dysfunction.  No documented history of amaurosis fugax or recent TIA symptoms. There are no recent neurological changes noted. No documented history of DVT, PE or superficial thrombophlebitis. The patient denies recent episodes of angina or shortness of breath.   ABI's Rt=0.79 and Lt=0.96  (previous ABI's Rt=1.23 and Lt=0.76) Duplex US of the upper extremities for vein mapping shows an excellent cephalic vein at the antecubital fossa measuring 5 mm in diameter.   Current Meds  Medication Sig   albuterol (VENTOLIN HFA) 108 (90 Base) MCG/ACT inhaler TAKE 2 PUFFS BY MOUTH EVERY 6 HOURS AS NEEDED FOR WHEEZE OR SHORTNESS OF BREATH   apixaban (ELIQUIS) 5 MG TABS tablet Take 1 tablet (5 mg total) by mouth 2 (two) times daily.   atorvastatin (LIPITOR) 40 MG tablet Take 1 tablet (40 mg total) by mouth daily.   carvedilol (COREG)  12.5 MG tablet Take 1 tablet (12.5 mg total) by mouth 2 (two) times daily with a meal.   clopidogrel (PLAVIX) 75 MG tablet Take 1 tablet (75 mg total) by mouth daily.   Dulaglutide 3 MG/0.5ML SOPN Inject into the skin once a week.   fluticasone (FLONASE) 50 MCG/ACT nasal spray Place 2 sprays into both nostrils daily.   gabapentin (NEURONTIN) 300 MG capsule Take 300 mg by mouth 2 (two) times daily.   glucose blood test strip OneTouch Verio strips   insulin aspart (NOVOLOG) 100 UNIT/ML injection Insulin pump   Insulin Disposable Pump (OMNIPOD DASH PODS, GEN 4,) MISC USE 1 POD EACH EVERY 72    HOURS   Insulin Human (INSULIN PUMP) SOLN Per endocrinoloy   loratadine (CLARITIN) 10 MG tablet Take 1 tablet by mouth daily.   losartan (COZAAR) 25 MG tablet Take 25 mg by mouth 2 (two) times daily.   PARoxetine (PAXIL) 40 MG tablet Take 1 tablet (40 mg total) by mouth daily.   silver sulfADIAZINE (SILVADENE) 1 % cream Apply 1 Application topically daily.   Spacer/Aero-Holding [facility] DIAMOND) MISC Use with inhaler to ensure medication is delivered throughout lungs   torsemide (DEMADEX) 20 MG tablet Take 1 tablet (20 mg total) by mouth every Monday, Wednesday, and Friday.   traMADol (ULTRAM) 50 MG tablet Take 50 mg by mouth 2 (two) times daily as needed.   Vitamin D, Ergocalciferol, (DRISDOL) 1.25  MG (50000 UT) CAPS capsule Take 50,000 Units by mouth once a week. (Sundays)    Past Medical History:  Diagnosis Date   Anxiety    Cardiomyopathy [facility])    new to her Jan 2017   Chronic kidney disease    COPD (chronic obstructive pulmonary disease) ([place])    Depression    Diabetes mellitus, type II ([place])    HTN (hypertension)    Obesity    Osteoporosis    PONV (postoperative nausea and vomiting)    Stroke [facility])    Jan 2017    Past Surgical History:  Procedure Laterality Date   ABDOMINAL HYSTERECTOMY     ANKLE FRACTURE SURGERY Left 2002   BILATERAL SALPINGOOPHORECTOMY  2000   CARDIAC  CATHETERIZATION N/A 03/25/2015   Procedure: Right/Left Heart Cath and Coronary Angiography;  Surgeon: Belva Crome, MD;  Location: [place] [facility];  Service: Cardiovascular;  Laterality: N/A;   CESAREAN SECTION  1978   COLONOSCOPY WITH PROPOFOL N/A 09/22/2016   Procedure: COLONOSCOPY WITH PROPOFOL;  Surgeon: Manya Silvas, MD;  Location: [facility] ENDOSCOPY;  Service: Endoscopy;  Laterality: N/A;   EP IMPLANTABLE DEVICE N/A 08/05/2015   Procedure: Loop Recorder Insertion;  Surgeon: Deboraha Sprang, MD;  Location: [place] [facility];  Service: Cardiovascular;  Laterality: N/A;   ESOPHAGOGASTRODUODENOSCOPY (EGD) WITH PROPOFOL N/A 09/22/2016   Procedure: ESOPHAGOGASTRODUODENOSCOPY (EGD) WITH PROPOFOL;  Surgeon: Manya Silvas, MD;  Location: [facility] ENDOSCOPY;  Service: Endoscopy;  Laterality: N/A;   ESOPHAGOGASTRODUODENOSCOPY (EGD) WITH PROPOFOL N/A 12/08/2016   Procedure: ESOPHAGOGASTRODUODENOSCOPY (EGD) WITH PROPOFOL;  Surgeon: Manya Silvas, MD;  Location: [facility] ENDOSCOPY;  Service: Endoscopy;  Laterality: N/A;   KNEE ARTHROSCOPY Left 2005   LOWER EXTREMITY ANGIOGRAPHY Left 08/12/2021   Procedure: Lower Extremity Angiography;  Surgeon: Katha Cabal, MD;  Location: [place] [facility];  Service: Cardiovascular;  Laterality: Left;   TEE WITHOUT CARDIOVERSION N/A 01/31/2015   Procedure: TRANSESOPHAGEAL ECHOCARDIOGRAM (TEE);  Surgeon: Lelon Perla, MD;  Location: [facility] ENDOSCOPY;  Service: Cardiovascular;  Laterality: N/A;   TEMPORARY DIALYSIS CATHETER N/A 08/14/2021   Procedure: TEMPORARY DIALYSIS CATHETER;  Surgeon: Katha Cabal, MD;  Location: [place] [facility];  Service: Cardiovascular;  Laterality: N/A;   TUBAL LIGATION  1978   ULNAR NERVE TRANSPOSITION  2008    Social History Social History   Tobacco Use   Smoking status: Former    Packs/day: 2.00    Years: 36.00    Total pack years: 72.00    Types: Cigarettes    Start date: 02/08/1974    Quit date: 03/11/2020    Years  since quitting: 1.5   Smokeless tobacco: Never   Tobacco comments:    Quit 03/2020  Vaping Use   Vaping Use: Never used  Substance Use Topics   Alcohol use: No    Alcohol/week: 0.0 standard drinks of alcohol   Drug use: No    Family History Family History  Problem Relation Age of Onset   Heart disease Mother        died from CHF   Asthma Mother    Diabetes Mother    Heart disease Father    Aneurysm Father    COPD Brother    Diabetes Brother    Alcohol abuse Paternal Aunt    Anemia Neg Hx    Arrhythmia Neg Hx    Clotting disorder Neg Hx    Fainting Neg Hx    Heart attack Neg Hx  Heart failure Neg Hx    Hyperlipidemia Neg Hx    Hypertension Neg Hx     Allergies  Allergen Reactions   Codeine Nausea And Vomiting     REVIEW OF SYSTEMS (Negative unless checked)  Constitutional: '[]'$ Weight loss  '[]'$ Fever  '[]'$ Chills Cardiac: '[]'$ Chest pain   '[]'$ Chest pressure   '[]'$ Palpitations   '[]'$ Shortness of breath when laying flat   '[]'$ Shortness of breath with exertion. Vascular:  '[x]'$ Pain in legs with walking   '[]'$ Pain in legs at rest  '[]'$ History of DVT   '[]'$ Phlebitis   '[]'$ Swelling in legs   '[]'$ Varicose veins   '[]'$ Non-healing ulcers Pulmonary:   '[]'$ Uses home oxygen   '[]'$ Productive cough   '[]'$ Hemoptysis   '[]'$ Wheeze  '[]'$ COPD   '[]'$ Asthma Neurologic:  '[]'$ Dizziness   '[]'$ Seizures   '[]'$ History of stroke   '[]'$ History of TIA  '[]'$ Aphasia   '[]'$ Vissual changes   '[]'$ Weakness or numbness in arm   '[]'$ Weakness or numbness in leg Musculoskeletal:   '[]'$ Joint swelling   '[]'$ Joint pain   '[]'$ Low back pain Hematologic:  '[]'$ Easy bruising  '[]'$ Easy bleeding   '[]'$ Hypercoagulable state   '[]'$ Anemic Gastrointestinal:  '[]'$ Diarrhea   '[]'$ Vomiting  '[]'$ Gastroesophageal reflux/heartburn   '[]'$ Difficulty swallowing. Genitourinary:  '[]'$ Chronic kidney disease   '[]'$ Difficult urination  '[]'$ Frequent urination   '[]'$ Blood in urine Skin:  '[]'$ Rashes   '[]'$ Ulcers  Psychological:  '[]'$ History of anxiety   '[]'$  History of major depression.  Physical Examination  Vitals:    10/15/21 1050  BP: 120/79  Pulse: 76  Resp: 17  Weight: 230 lb (104.3 kg)  Height: '5\' 6"'$  (1.676 m)   Body mass index is 37.12 kg/m. Gen: WD/WN, NAD Head: [place]/AT, No temporalis wasting.  Ear/Nose/Throat: Hearing grossly intact, nares w/o erythema or drainage Eyes: PER, EOMI, sclera nonicteric.  Neck: Supple, no masses.  No bruit or JVD.  Pulmonary:  Good air movement, no audible wheezing, no use of accessory muscles.  Cardiac: RRR, normal S1, S2, no Murmurs. Vascular:  mild trophic changes,  open wound left shin Vessel Right Left  Radial Palpable Palpable  PT Not Palpable Not Palpable  DP Palpable Palpable  Gastrointestinal: soft, non-distended. No guarding/no peritoneal signs.  Musculoskeletal: M/S 5/5 throughout.  No visible deformity.  Neurologic: CN 2-12 intact. Pain and light touch intact in extremities.  Symmetrical.  Speech is fluent. Motor exam as listed above. Psychiatric: Judgment intact, Mood & affect appropriate for pt's clinical situation. Dermatologic: No rashes or ulcers noted.  No changes consistent with cellulitis.   CBC Lab Results  Component Value Date   WBC 12.8 (H) 09/02/2021   HGB 10.8 (L) 09/02/2021   HCT 35.8 09/02/2021   MCV 84 09/02/2021   PLT 499 (H) 09/02/2021    BMET    Component Value Date/Time   NA 141 09/02/2021 1408   K 4.7 09/02/2021 1408   CL 105 09/02/2021 1408   CO2 18 (L) 09/02/2021 1408   GLUCOSE 248 (H) 09/02/2021 1408   GLUCOSE 165 (H) 08/23/2021 0408   BUN 104 (HH) 09/02/2021 1408   CREATININE 3.35 (H) 09/02/2021 1408   CREATININE 1.37 (H) 03/21/2015 1451   CALCIUM 9.1 09/02/2021 1408   GFRNONAA 20 (L) 08/23/2021 0408   GFRAA 33 (L) 04/23/2019 1446   CrCl cannot be calculated (Patient's most recent lab result is older than the maximum 21 days allowed.).  COAG Lab Results  Component Value Date   INR 1.5 (H) 08/14/2021   INR 0.93 10/05/2017   INR 1.05 03/21/2015    Radiology VAS  [place] UPPER EXT VEIN MAPPING (PRE-OP  AVF)  Result Date: 10/05/2021 UPPER EXTREMITY VEIN MAPPING Patient Name:  Jocelyn Wells  Date of Exam:   10/01/2021 Medical Rec #: [id number]       Accession #:    [id number] Date of Birth: 1955-08-04       Patient Gender: F Patient Age:   51 years Exam Location:  [facility] Procedure:      VAS [place] UPPER EXT VEIN MAPPING (PRE-OP AVF) Referring Phys: Eulogio Ditch --------------------------------------------------------------------------------  Indications: Pre-access. Performing Technologist: Delorise Shiner RVT  Examination Guidelines: A complete evaluation includes B-mode imaging, spectral Doppler, color Doppler, and power Doppler as needed of all accessible portions of each vessel. Bilateral testing is considered an integral part of a complete examination. Limited examinations for reoccurring indications may be performed as noted. +-----------------+-------------+----------+--------+ Right Cephalic   Diameter (cm)Depth (cm)Findings +-----------------+-------------+----------+--------+ Shoulder             0.39                        +-----------------+-------------+----------+--------+ Prox upper arm       0.46                        +-----------------+-------------+----------+--------+ Mid upper arm        0.49                        +-----------------+-------------+----------+--------+ Dist upper arm       0.48                        +-----------------+-------------+----------+--------+ Antecubital fossa    0.33                        +-----------------+-------------+----------+--------+ Prox forearm         0.20                        +-----------------+-------------+----------+--------+ Mid forearm          0.13                        +-----------------+-------------+----------+--------+ Dist forearm         0.11                        +-----------------+-------------+----------+--------+ +-----------------+-------------+----------+--------+ Right  Basilic    Diameter (cm)Depth (cm)Findings +-----------------+-------------+----------+--------+ Prox upper arm       0.75                        +-----------------+-------------+----------+--------+ Mid upper arm        0.49                        +-----------------+-------------+----------+--------+ Dist upper arm       0.44                        +-----------------+-------------+----------+--------+ Antecubital fossa    0.50                        +-----------------+-------------+----------+--------+ Prox forearm         0.41                        +-----------------+-------------+----------+--------+ +-----------------+-------------+----------+--------+  Left Cephalic    Diameter (cm)Depth (cm)Findings +-----------------+-------------+----------+--------+ Shoulder             0.39                        +-----------------+-------------+----------+--------+ Prox upper arm       0.29                        +-----------------+-------------+----------+--------+ Mid upper arm        0.27                        +-----------------+-------------+----------+--------+ Dist upper arm       0.32                        +-----------------+-------------+----------+--------+ Antecubital fossa    0.55                        +-----------------+-------------+----------+--------+ Prox forearm         0.38                        +-----------------+-------------+----------+--------+ Mid forearm          0.37                        +-----------------+-------------+----------+--------+ Dist forearm         0.41                        +-----------------+-------------+----------+--------+ +-----------------+-------------+----------+--------+ Left Basilic     Diameter (cm)Depth (cm)Findings +-----------------+-------------+----------+--------+ Prox upper arm       0.80                        +-----------------+-------------+----------+--------+ Mid upper  arm        0.61                        +-----------------+-------------+----------+--------+ Dist upper arm       0.49                        +-----------------+-------------+----------+--------+ Antecubital fossa    0.49                        +-----------------+-------------+----------+--------+ Prox forearm         0.42                        +-----------------+-------------+----------+--------+ Summary: Right: Cephalic and basilic vein identified with diameters as        described above. Left: Cephalic and basilic vein identified with diameters as       described above. *See table(s) above for measurements and observations.  Diagnosing physician: [name] MD Electronically signed by [name] MD on 10/05/2021 at 5:19:32 PM.    Final    VAS [place] UPPER EXTREMITY ARTERIAL DUPLEX  Result Date: 10/05/2021  UPPER EXTREMITY DUPLEX STUDY Patient Name:  Jocelyn Wells  Date of Exam:   10/01/2021 Medical Rec #: [id number]       Accession #:    [id number] Date of Birth: 10/05/1955       Patient Gender: F Patient Age:   80 years Exam Location:  [facility] Procedure:      VAS [place] UPPER EXTREMITY ARTERIAL DUPLEX Referring Phys: Eulogio Ditch --------------------------------------------------------------------------------  Indications: Pre-access.  Performing Technologist: Delorise Shiner RVT  Examination Guidelines: A complete evaluation includes B-mode imaging, spectral Doppler, color Doppler, and power Doppler as needed of all accessible portions of each vessel. Bilateral testing is considered an integral part of a complete examination. Limited examinations for reoccurring indications may be performed as noted.  Right Doppler Findings:  +-----------------------+----------+--------------------+---------+--------+ Location               PSV (cm/s)Intralum. Diam. (cm)Waveform Comments +-----------------------+----------+--------------------+---------+--------+ Brachial Antecub.  [id number]       0.41                triphasic         +-----------------------+----------+--------------------+---------+--------+ Radial Art at Wrist    107       0.26                triphasic         +-----------------------+----------+--------------------+---------+--------+ Ulnar Art at Wrist     8         0.19                triphasic         +-----------------------+----------+--------------------+---------+--------+  Left Pre-Dialysis Findings: +-----------------------+----------+--------------------+---------+--------+ Location               PSV (cm/s)Intralum. Diam. (cm)Waveform Comments +-----------------------+----------+--------------------+---------+--------+ Brachial Antecub. [id number]       0.41                triphasic         +-----------------------+----------+--------------------+---------+--------+ Radial Art at Wrist    89        0.18                triphasic         +-----------------------+----------+--------------------+---------+--------+ Ulnar Art at Wrist     86        0.24                triphasic         +-----------------------+----------+--------------------+---------+--------+  Summary:  Right: No obstruction visualized in the right upper extremity. Left: No obstruction visualized in the left upper extremity. *See table(s) above for measurements and observations. Electronically signed by [name] MD on 10/05/2021 at 5:19:06 PM.    Final    ECHOCARDIOGRAM COMPLETE  Result Date: 09/29/2021    ECHOCARDIOGRAM REPORT   Patient Name:   Jocelyn Wells Date of Exam: 09/29/2021 Medical Rec #:  [id number]      Height:       66.0 in Accession #:    [id number]     Weight:       224.0 lb Date of Birth:  1955-07-10      BSA:          2.098 m Patient Age:    71 years       BP:           140/80 mmHg Patient Gender: F              HR:           68 bpm. Exam Location:  [facility] Procedure: 2D Echo, 3D Echo, Cardiac Doppler, Color Doppler and Strain  Analysis Indications:    I25.5 Ischemic cardiomyopathy  History:        Patient has prior history of Echocardiogram examinations, most  recent 10/06/2017. Cardiomyopathy, Stroke, Pulmonary HTN and                 COPD, Signs/Symptoms:Dizziness/Lightheadedness and Edema; Risk                 Factors:Diabetes, Hypertension, Dyslipidemia and Former Smoker.  Sonographer:    Pilar Jarvis RDMS, RVT, RDCS Referring Phys: [id number] BRIAN AGBOR-ETANG IMPRESSIONS  1. Left ventricular ejection fraction, by estimation, is 45 to 50%. The left ventricle has mildly decreased function. The left ventricle has no regional wall motion abnormalities. There is moderate left ventricular hypertrophy. Left ventricular diastolic parameters are consistent with Grade I diastolic dysfunction (impaired relaxation).  2. Right ventricular systolic function is normal. The right ventricular size is normal.  3. Left atrial size was mildly dilated.  4. The mitral valve is normal in structure. No evidence of mitral valve regurgitation. No evidence of mitral stenosis.  5. The aortic valve is normal in structure. Aortic valve regurgitation is not visualized. Aortic valve sclerosis/calcification is present, without any evidence of aortic stenosis.  6. The inferior vena cava is normal in size with greater than 50% respiratory variability, suggesting right atrial pressure of 3 mmHg. FINDINGS  Left Ventricle: Left ventricular ejection fraction, by estimation, is 45 to 50%. The left ventricle has mildly decreased function. The left ventricle has no regional wall motion abnormalities. The left ventricular internal cavity size was normal in size. There is moderate left ventricular hypertrophy. Left ventricular diastolic parameters are consistent with Grade I diastolic dysfunction (impaired relaxation). Right Ventricle: The right ventricular size is normal. No increase in right ventricular wall thickness. Right ventricular systolic function is  normal. Left Atrium: Left atrial size was mildly dilated. Right Atrium: Right atrial size was normal in size. Pericardium: There is no evidence of pericardial effusion. Mitral Valve: The mitral valve is normal in structure. There is mild calcification of the mitral valve leaflet(s). No evidence of mitral valve regurgitation. No evidence of mitral valve stenosis. Tricuspid Valve: The tricuspid valve is normal in structure. Tricuspid valve regurgitation is not demonstrated. No evidence of tricuspid stenosis. Aortic Valve: The aortic valve is normal in structure. Aortic valve regurgitation is not visualized. Aortic valve sclerosis/calcification is present, without any evidence of aortic stenosis. Aortic valve mean gradient measures 3.0 mmHg. Aortic valve peak  gradient measures 5.5 mmHg. Aortic valve area, by VTI measures 2.67 cm. Pulmonic Valve: The pulmonic valve was normal in structure. Pulmonic valve regurgitation is not visualized. No evidence of pulmonic stenosis. Aorta: The aortic root is normal in size and structure. Venous: The inferior vena cava is normal in size with greater than 50% respiratory variability, suggesting right atrial pressure of 3 mmHg. IAS/Shunts: No atrial level shunt detected by color flow Doppler.  LEFT VENTRICLE PLAX 2D LVIDd:         5.00 cm      Diastology LVIDs:         3.60 cm      LV e' medial:    3.15 cm/s LV PW:         1.50 cm      LV E/e' medial:  21.9 LV IVS:        1.60 cm      LV e' lateral:   4.24 cm/s LVOT diam:     2.10 cm      LV E/e' lateral: 16.3 LV SV:         65 LV SV Index:   31 LVOT Area:  3.46 cm                              3D Volume EF: LV Volumes (MOD)            3D EF:        45 % LV vol d, MOD A2C: 127.0 ml LV EDV:       270 ml LV vol d, MOD A4C: 152.0 ml LV ESV:       149 ml LV vol s, MOD A2C: 67.5 ml  LV SV:        121 ml LV vol s, MOD A4C: 78.0 ml LV SV MOD A2C:     59.5 ml LV SV MOD A4C:     152.0 ml LV SV MOD BP:      65.2 ml RIGHT VENTRICLE              IVC RV Basal diam:  3.70 cm     IVC diam: 2.30 cm RV S prime:     13.10 cm/s TAPSE (M-mode): 2.8 cm LEFT ATRIUM             Index        RIGHT ATRIUM           Index LA diam:        4.70 cm 2.24 cm/m   RA Area:     16.10 cm LA Vol (A2C):   71.8 ml 34.22 ml/m  RA Volume:   43.80 ml  20.87 ml/m LA Vol (A4C):   43.6 ml 20.78 ml/m LA Biplane Vol: 57.9 ml 27.59 ml/m  AORTIC VALVE                    PULMONIC VALVE AV Area (Vmax):    2.80 cm     PV Vmax:       1.22 m/s AV Area (Vmean):   2.65 cm     PV Peak grad:  6.0 mmHg AV Area (VTI):     2.67 cm AV Vmax:           117.00 cm/s AV Vmean:          86.200 cm/s AV VTI:            0.245 m AV Peak Grad:      5.5 mmHg AV Mean Grad:      3.0 mmHg LVOT Vmax:         94.60 cm/s LVOT Vmean:        65.900 cm/s LVOT VTI:          0.189 m LVOT/AV VTI ratio: 0.77  AORTA Ao Root diam: 3.30 cm Ao Asc diam:  3.30 cm Ao Arch diam: 2.8 cm MITRAL VALVE MV Area (PHT): 2.69 cm    SHUNTS MV Decel Time: 282 msec    Systemic VTI:  0.19 m MV E velocity: 69.00 cm/s  Systemic Diam: 2.10 cm MV A velocity: 80.10 cm/s MV E/A ratio:  0.86 Ida Rogue MD Electronically signed by Ida Rogue MD Signature Date/Time: 09/29/2021/7:05:17 PM    Final    VAS [place] LOWER EXTREMITY ARTERIAL DUPLEX  Result Date: 09/22/2021 LOWER EXTREMITY ARTERIAL DUPLEX STUDY Patient Name:  Jocelyn Wells  Date of Exam:   09/22/2021 Medical Rec #: [id number]       Accession #:    [id number] Date of Birth: [birth year]       Patient Gender: F Patient  Age:   41 years Exam Location:  [facility] Procedure:      VAS [place] LOWER EXTREMITY ARTERIAL DUPLEX Referring Phys: Eulogio Ditch --------------------------------------------------------------------------------  Indications: Increased pain in left calf wound.  Vascular Interventions: 7/2023Left SFA Pop and TP trunk thrombectomy. Current ABI:            n/a Performing Technologist: Concha Norway RVT  Examination Guidelines: A complete evaluation includes B-mode  imaging, spectral Doppler, color Doppler, and power Doppler as needed of all accessible portions of each vessel. Bilateral testing is considered an integral part of a complete examination. Limited examinations for reoccurring indications may be performed as noted.   +----------+--------+-----+--------+---------+--------+ LEFT      PSV cm/sRatioStenosisWaveform Comments +----------+--------+-----+--------+---------+--------+ CFA Mid   77                   triphasic         +----------+--------+-----+--------+---------+--------+ DFA       57                   triphasic         +----------+--------+-----+--------+---------+--------+ SFA Prox  113                  triphasic         +----------+--------+-----+--------+---------+--------+ SFA Mid   82                   triphasic         +----------+--------+-----+--------+---------+--------+ SFA Distal82                   triphasic         +----------+--------+-----+--------+---------+--------+ POP Distal158                  triphasic         +----------+--------+-----+--------+---------+--------+ ATA Distal75                   biphasic          +----------+--------+-----+--------+---------+--------+ PTA Distal41                   triphasic         +----------+--------+-----+--------+---------+--------+  Summary: Left: Patent LEA system and SFA stents throughout.  See table(s) above for measurements and observations. Electronically signed by Leotis Pain MD on 09/22/2021 at 4:42:19 PM.    Final      Assessment/Plan 1. Atherosclerosis of native arteries of the extremities with ulceration ([place]) Recommend:  The patient is status post successful angiogram with intervention.  The patient reports that the claudication symptoms and leg pain has improved.   The patient denies lifestyle limiting changes at this point in time.  No further invasive studies, angiography or surgery at this time The patient should  continue walking and begin a more formal exercise program.  The patient should continue antiplatelet therapy and aggressive treatment of the lipid abnormalities  Continued surveillance is indicated as atherosclerosis is likely to progress with time.    Patient should undergo noninvasive studies as ordered. The patient will follow up with me to review the studies.   - VAS [place] ABI WITH/WO TBI - VAS [place] ABI WITH/WO TBI; Future  2. CKD (chronic kidney disease), stage IV (HCC) Recommend:  At this time the patient does not have appropriate extremity access for dialysis  Patient should have a left brachial cephalic fistula created.  The risks, benefits and alternative therapies were reviewed in detail with the  patient.  All questions were answered.  The patient is considering PD and wishes to talk with nephrology before she proceeds with surgery.   The patient will follow up with me in the office after the surgery.   3. Hypertension associated with diabetes ([place]) Continue antihypertensive medications as already ordered, these medications have been reviewed and there are no changes at this time.   4. Chronic deep vein thrombosis (DVT) of femoral vein of right lower extremity (HCC) Recommend:   No surgery or intervention at this point in time.  IVC filter is not indicated at present.  The patient is on anticoagulation, she is taking Eliquis 5 mg bid   Elevation was stressed, such as the use of a recliner.  I have discussed  DVT and post phlebitic changes such as swelling and why it  causes symptoms such as pain.  The patient should wear graduated compression stockings beginning after three full days of anticoagulation.  The compression should be worn on a daily basis. The patient should wearing the stockings first thing in the morning and removing them in the evening. The patient should not to sleep in the stockings.  In addition, behavioral modification including elevation during the day and  avoidance of prolonged dependency will be initiated.    The patient will continue anticoagulation for now as there have not been any problems or complications at this point.    5. Heart failure, unspecified HF chronicity, unspecified heart failure type ([place]) Continue cardiac and antihypertensive medications as already ordered and reviewed, no changes at this time.  Continue statin as ordered and reviewed, no changes at this time   6. Chronic obstructive pulmonary disease, unspecified COPD type ([place]) Continue pulmonary medications and aerosols as already ordered, these medications have been reviewed and there are no changes at this time.      [name], MD  10/15/2021 11:00 AM

## 2021-10-16 ENCOUNTER — Ambulatory Visit (INDEPENDENT_AMBULATORY_CARE_PROVIDER_SITE_OTHER): Payer: Medicare Other

## 2021-10-16 DIAGNOSIS — I5033 Acute on chronic diastolic (congestive) heart failure: Secondary | ICD-10-CM | POA: Diagnosis not present

## 2021-10-16 DIAGNOSIS — I110 Hypertensive heart disease with heart failure: Secondary | ICD-10-CM | POA: Diagnosis not present

## 2021-10-16 DIAGNOSIS — Z48812 Encounter for surgical aftercare following surgery on the circulatory system: Secondary | ICD-10-CM | POA: Diagnosis not present

## 2021-10-16 DIAGNOSIS — E1122 Type 2 diabetes mellitus with diabetic chronic kidney disease: Secondary | ICD-10-CM | POA: Diagnosis not present

## 2021-10-16 DIAGNOSIS — E1129 Type 2 diabetes mellitus with other diabetic kidney complication: Secondary | ICD-10-CM

## 2021-10-16 DIAGNOSIS — N184 Chronic kidney disease, stage 4 (severe): Secondary | ICD-10-CM | POA: Diagnosis not present

## 2021-10-16 DIAGNOSIS — E1151 Type 2 diabetes mellitus with diabetic peripheral angiopathy without gangrene: Secondary | ICD-10-CM | POA: Diagnosis not present

## 2021-10-16 DIAGNOSIS — J449 Chronic obstructive pulmonary disease, unspecified: Secondary | ICD-10-CM

## 2021-10-16 MED ORDER — CLOPIDOGREL BISULFATE 75 MG PO TABS: 75.0000 mg | ORAL_TABLET | Freq: Every day | ORAL | 3 refills | Status: DC

## 2021-10-16 NOTE — Progress Notes (Signed)
Chronic Care Management Pharmacy Note  10/16/2021 Name:  Jocelyn Wells MRN:  [id number] DOB:  11-26-1955  Summary: Patient presents for CCM follow-up.  Recommendations/Changes made from today's visit: Continue current medications  Plan: CPP follow-up 1 month   Subjective: Jocelyn Wells is an 66 y.o. year old female who is a primary patient of Gwyneth Sprout, FNP.  The CCM team was consulted for assistance with disease management and care coordination needs.    Engaged with patient by telephone for follow up visit in response to provider referral for pharmacy case management and/or care coordination services.   Consent to Services:  The patient was given information about Chronic Care Management services, agreed to services, and gave verbal consent prior to initiation of services.  Please see initial visit note for detailed documentation.   Patient Care Team: Gwyneth Sprout, FNP as PCP - General (Family Medicine) Gabriel Carina, Betsey Holiday, MD as Physician Assistant (Endocrinology) Deboraha Sprang, MD as Consulting Physician (Cardiology) Rainey Pines, MD as Referring Physician (Psychiatry) Ralene Bathe, MD as Consulting Physician (Dermatology) Manya Silvas, MD (Inactive) as Consulting Physician (Gastroenterology) Dimmig, Marcello Moores, MD as Referring Physician (Orthopedic Surgery) Schnier, Dolores Lory, MD (Vascular Surgery) Lynnell Dike, OD (Optometry) Jannifer Franklin, NP as Nurse Practitioner (Neurology) Germaine Pomfret, [facility] (Pharmacist)  Recent office visits: 09/02/2021 Tally Joe FNP (PCP) No Medication Changes noted, Return in about 3 months 07/14/2021 Tally Joe FNP (PCP) No Medication Changes noted,  Ambulatory referral to Cardiology, Ambulatory referral to Pulmonology, Return in about 3 months  05/27/2021 Kirke Shaggy LPN (PCP Office) Medicare wellness completed 04/13/2021 Dr. Brita Romp MD (PCP) No medication Changes noted, Return in about 2 months   Recent consult  visits: 10/15/21: Patient presented to Dr. Ronalee Belts (vascular).  10/14/2021 Dr. Lanora Manis MD (Nephrology) No Medication Changes noted 10/07/2021 Dr. Modesta Messing MD ([place]) Unable to see note 10/06/21: Patient presented to Dr. Patsey Berthold (pulmonology)  08/27/2021 Dr. Lanora Manis MD (Nephrology) No Medication Changes noted 08/05/2021 Eulogio Ditch NP (Vascular Surgery) Start sulfamethoxazole- trimethoprim 800-160 MG tablet 07/30/2021 Dr. Lanora Manis MD (Nephrology) No Medication Changes noted 07/17/2021 Dr. Garen Lah MD (Cardiology) Start torsemide 20 mg daily, Stop HCTZ, change Losartan to 50 mg daily, Change Carvedilol to 12.5 mg twice daily 07/09/2021 Dr. Modesta Messing MD ([place]) Unable to see note 07/01/2021 Eulogio Ditch NP (Vascular Surgery) Start Cefdinir 300 mg 2 times daily 06/01/2021 Dr. Gabriel Carina MD (Endocrinology) Adjusted her pump settings Basal rate 1.5 units/hr 24-hr basal is 36 units, Follow up in 3 months 05/28/2021 Dr. Lanora Manis MD (Nephrology)No medication Changes noted, Return in 2 months 04/29/2021 Dr. Lanora Manis MD (Nephrology) No medication Changes noted, Return in 4 weeks 04/17/2021 Dr. Gabriel Carina MD (Endocrinology) Increase Trulicity to 3 mg weekly, follow up in 6 weeks  04/15/2021 Dr. Holley Raring MD (Pain Medicine) No Medication Changes noted   Hospital visits: Admitted to the hospital on 08/12/2021 due to Acute renal failure. Discharge date was 08/23/2021. Discharged from [facility].    Objective:  Lab Results  Component Value Date   CREATININE 3.35 (H) 09/02/2021   BUN 104 (HH) 09/02/2021   EGFR 14 (L) 09/02/2021   GFRNONAA 20 (L) 08/23/2021   GFRAA 33 (L) 04/23/2019   NA 141 09/02/2021   K 4.7 09/02/2021   CALCIUM 9.1 09/02/2021   CO2 18 (L) 09/02/2021   GLUCOSE 248 (H) 09/02/2021    Lab Results  Component Value Date/Time   HGBA1C 7.9 (A) 07/14/2021 03:44 PM   HGBA1C  10.3 09/02/2020 12:00 AM   HGBA1C 10.2 (H) 05/31/2020 05:32 AM   HGBA1C  11.4 03/28/2019 12:00 AM   MICROALBUR 34 05/19/2016 12:00 AM    Last diabetic Eye exam:  Lab Results  Component Value Date/Time   HMDIABEYEEXA Retinopathy (A) 07/15/2020 02:18 PM    Last diabetic Foot exam: No results found for: "HMDIABFOOTEX"   Lab Results  Component Value Date   CHOL 271 (H) 10/20/2020   HDL 41 10/20/2020   LDLCALC 186 (H) 10/20/2020   TRIG 231 (H) 10/20/2020   CHOLHDL 6.6 (H) 10/20/2020       Latest Ref Rng & Units 09/02/2021    2:08 PM 08/20/2021    4:17 AM 08/19/2021    8:49 AM  Hepatic Function  Total Protein 6.0 - 8.5 g/dL 6.6     Albumin 3.9 - 4.9 g/dL 3.8  3.1  3.0   AST 0 - 40 IU/L 11     ALT 0 - 32 IU/L 12     Alk Phosphatase 44 - 121 IU/L 62     Total Bilirubin 0.0 - 1.2 mg/dL 0.2       Lab Results  Component Value Date/Time   TSH 2.977 10/06/2021 12:38 PM       Latest Ref Rng & Units 09/02/2021    2:08 PM 08/23/2021    4:08 AM 08/22/2021    4:40 AM  CBC  WBC 3.4 - 10.8 x10E3/uL 12.8  13.0  13.5   Hemoglobin 11.1 - 15.9 g/dL 10.8  11.6  11.3   Hematocrit 34.0 - 46.6 % 35.8  39.3  38.1   Platelets 150 - 450 x10E3/uL 499  534  494     No results found for: "VD25OH"  Clinical ASCVD: Yes  The ASCVD Risk score (Arnett DK, et al., 2019) failed to calculate for the following reasons:   The patient has a prior MI or stroke diagnosis       09/02/2021    1:49 PM 07/14/2021    2:04 PM 05/27/2021    2:03 PM  Depression screen PHQ 2/9  Decreased Interest 0 0 0  Down, Depressed, Hopeless 0 1 0  PHQ - 2 Score 0 1 0  Altered sleeping 0 1 1  Tired, decreased energy 1 3 1   Change in appetite 0 3 1  Feeling bad or failure about yourself  0 1   Trouble concentrating 0 0 0  Moving slowly or fidgety/restless 0 3 0  Suicidal thoughts 0 0 0  PHQ-9 Score 1 12 3   Difficult doing work/chores Not difficult at all Extremely dIfficult Not difficult at all    Social History   Tobacco Use  Smoking Status Former   Packs/day: 2.00   Years: 36.00    Total pack years: 72.00   Types: Cigarettes   Start date: 02/08/1974   Quit date: 03/11/2020   Years since quitting: 1.6  Smokeless Tobacco Never  Tobacco Comments   Quit 03/2020   BP Readings from Last 3 Encounters:  10/15/21 120/79  10/06/21 124/80  09/10/21 (!) 143/77   Pulse Readings from Last 3 Encounters:  10/15/21 76  10/06/21 74  09/10/21 77   Wt Readings from Last 3 Encounters:  10/15/21 230 lb (104.3 kg)  10/06/21 230 lb 9.6 oz (104.6 kg)  09/10/21 224 lb (101.6 kg)   BMI Readings from Last 3 Encounters:  10/15/21 37.12 kg/m  10/06/21 37.22 kg/m  09/10/21 36.15 kg/m    Assessment/Interventions: Review of patient  past medical history, allergies, medications, health status, including review of consultants reports, laboratory and other test data, was performed as part of comprehensive evaluation and provision of chronic care management services.   SDOH:  (Social Determinants of Health) assessments and interventions performed: Yes SDOH Interventions    Flowsheet Row Chronic Care Management from 09/08/2021 in [place] Visit from 09/02/2021 in [place] Visit from 07/14/2021 in [place] from 05/27/2021 in [place] Visit from 04/13/2021 in confidential department Chronic Care Management from 02/14/2020 in [place] Interventions        Food Insecurity Interventions -- -- -- Intervention Not Indicated -- Intervention Not Indicated  Housing Interventions -- -- -- Intervention Not Indicated -- --  Transportation Interventions Intervention Not Indicated -- -- Intervention Not Indicated -- Intervention Not Indicated  Depression Interventions/Treatment  -- PHQ2-9 Score <4 Follow-up Not Indicated Patient refuses Treatment -- Currently on Treatment --  Financial Strain Interventions Intervention Not Indicated -- -- Intervention Not Indicated -- --  Physical  Activity Interventions -- -- -- Patient Refused -- --  Stress Interventions -- -- -- Intervention Not Indicated -- --  Social Connections Interventions -- -- -- Intervention Not Indicated -- --      SDOH Screenings   Food Insecurity: No Food Insecurity (05/27/2021)  Housing: Low Risk  (05/27/2021)  Transportation Needs: No Transportation Needs (10/07/2021)  Alcohol Screen: Low Risk  (05/27/2021)  Depression (PHQ2-9): Low Risk  (09/02/2021)  Recent Concern: Depression (PHQ2-9) - Medium Risk (07/14/2021)  Financial Resource Strain: Low Risk  (10/07/2021)  Physical Activity: Inactive (05/27/2021)  Social Connections: Moderately Isolated (05/27/2021)  Stress: No Stress Concern Present (05/27/2021)  Tobacco Use: Medium Risk (10/15/2021)    CCM Care Plan  Allergies  Allergen Reactions   Codeine Nausea And Vomiting    Medications Reviewed Today     Reviewed by Katha Cabal, MD (Physician) on 10/15/21 at 1143  Med List Status: <None>   Medication Order Taking? Sig Documenting Provider Last Dose Status Informant  albuterol (VENTOLIN HFA) 108 (90 Base) MCG/ACT inhaler [id number] Yes TAKE 2 PUFFS BY MOUTH EVERY 6 HOURS AS NEEDED FOR WHEEZE OR SHORTNESS OF Mellody Drown, FNP Taking Active Self  apixaban (ELIQUIS) 5 MG TABS tablet [id number] Yes Take 1 tablet (5 mg total) by mouth 2 (two) times daily. Schnier, Dolores Lory, MD Taking Active Self  atorvastatin (LIPITOR) 40 MG tablet [id number] Yes Take 1 tablet (40 mg total) by mouth daily. Gwyneth Sprout, FNP Taking Active   carvedilol (COREG) 12.5 MG tablet [id number] Yes Take 1 tablet (12.5 mg total) by mouth 2 (two) times daily with a meal. Kate Sable, MD Taking Active Self  clopidogrel (PLAVIX) 75 MG tablet [id number] Yes Take 1 tablet (75 mg total) by mouth daily. Sharen Hones, MD Taking Active   Dulaglutide 3 MG/0.5ML SOPN [id number] Yes Inject into the skin once a week. [provider] Taking Active Self  fluticasone  (FLONASE) 50 MCG/ACT nasal spray [id number] Yes Place 2 sprays into both nostrils daily. Gwyneth Sprout, FNP Taking Active Self  gabapentin (NEURONTIN) 300 MG capsule [id number] Yes Take 300 mg by mouth 2 (two) times daily. [provider] Taking Active Self  glucose blood test strip [id number] Yes OneTouch Verio strips [provider] Taking Active Self  insulin aspart (NOVOLOG) 100 UNIT/ML injection [id number] Yes Insulin pump [provider] Taking Active Self  Insulin Disposable Pump (OMNIPOD DASH PODS,  GEN 4,) MISC [id number] Yes USE 1 POD EACH EVERY 72    HOURS [provider] Taking Active Self  Insulin Human (INSULIN PUMP) SOLN [id number] Yes Per endocrinoloy Elgergawy, Silver Huguenin, MD Taking Active Self  loratadine (CLARITIN) 10 MG tablet [id number] Yes Take 1 tablet by mouth daily. [provider] Taking Active Self           Med Note Inocente Salles, RACHEL A   Fri Mar 21, 2015 10:46 AM)    losartan (COZAAR) 25 MG tablet [id number] Yes Take 25 mg by mouth 2 (two) times daily. [provider] Taking Active   oxyCODONE (OXY IR/ROXICODONE) 5 MG immediate release tablet [id number] No Take 1 tablet (5 mg total) by mouth every 8 (eight) hours as needed for severe pain.  Patient not taking: Reported on 10/15/2021   Kris Hartmann, NP Not Taking Active   pantoprazole (PROTONIX) 40 MG tablet [id number] No Take 1 tablet (40 mg total) by mouth 2 (two) times daily.  Patient not taking: Reported on 10/15/2021   Sharen Hones, MD Not Taking Active   PARoxetine (PAXIL) 40 MG tablet [id number] Yes Take 1 tablet (40 mg total) by mouth daily. Norman Clay, MD Taking Active   silver sulfADIAZINE (SILVADENE) 1 % cream [id number] Yes Apply 1 Application topically daily. Kris Hartmann, NP Taking Active   Spacer/Aero-Holding Josiah Lobo ([place]) Lloyd [id number] Yes Use with inhaler to ensure medication is delivered throughout lungs Gwyneth Sprout, FNP Taking Active Self   torsemide (DEMADEX) 20 MG tablet [id number] Yes Take 1 tablet (20 mg total) by mouth every Monday, Wednesday, and Friday. Sharen Hones, MD Taking Active   traMADol Veatrice [place]) 50 MG tablet [id number] Yes Take 50 mg by mouth 2 (two) times daily as needed. [provider] Taking Active   Vitamin D, Ergocalciferol, (DRISDOL) 1.25 MG (50000 UT) CAPS capsule [id number] Yes Take 50,000 Units by mouth once a week. (Sundays) [provider] Taking Active Self  Med List Note Ignatius Specking, RN 10/02/20 [id number]): MR 12/31/20 UDS 10/02/20            Patient Active Problem List   Diagnosis Date Noted   Hospital discharge follow-up 09/02/2021   Heart failure ([place]) 09/02/2021   Hyperkalemia 08/15/2021   GI bleed 08/14/2021   Acute respiratory failure with hypoxia (HCC) 08/14/2021   Acute on chronic diastolic CHF (congestive heart failure) ([place]) 08/14/2021   Atherosclerosis of native arteries of the extremities with ulceration ([place]) 08/12/2021   Positive depression screening 07/14/2021   DOE (dyspnea on exertion) 07/14/2021   Bilateral leg edema 07/14/2021   Fall 04/13/2021   Anemia in chronic kidney disease 03/18/2021   Heart failure, unspecified ([place]) 03/18/2021   Hyperparathyroidism due to renal insufficiency ([place]) 03/18/2021   Chronic depression 03/18/2021   Overweight 03/18/2021   Poor appetite 02/13/2021   MDD (major depressive disorder), recurrent episode, mild ([place]) 02/13/2021   Benign hypertensive kidney disease with chronic kidney disease 10/14/2020   CKD (chronic kidney disease), stage IV ([place]) 05/31/2020   Compression fracture of L1 lumbar vertebra ([place]) 01/30/2019   Compression fracture of body of thoracic vertebra ([place]) 01/30/2019   Lumbar foraminal stenosis (RIGHT L1) 01/30/2019   Spinal stenosis of lumbar region with neurogenic claudication (L4-L5) 01/30/2019   Osteoporosis, post-menopausal 04/14/2018   Vitamin D deficiency 04/14/2018   Chronic deep vein  thrombosis (DVT) ([place]) 02/09/2018   Post-menopausal 11/17/2017   Aortic atherosclerosis ([place]) 11/08/2017   Snoring 04/24/2015   Sleep paralysis, recurrent  isolated 04/24/2015   Hypersomnia with sleep apnea 04/24/2015   Cataplexy 04/24/2015   Morbid obesity due to excess calories ([place]) 04/24/2015   COPD (chronic obstructive pulmonary disease) ([place]) 63/84/5364   Embolic stroke ([place]) 68/04/2120   Acute renal failure superimposed on stage 4 chronic kidney disease ([place]) 01/29/2015   Dizziness and giddiness 07/05/2014   Hypertension associated with diabetes ([place]) 07/05/2014   Carbuncle and furuncle 07/05/2014   Pain of perianal area 07/05/2014   Guttate psoriasis 07/05/2014   H/O: osteoarthritis 06/05/2014   Anxiety, generalized 06/05/2014   Microalbuminuria 12/23/2013   Long term current use of insulin ([place]) 12/23/2013   Compulsive tobacco user syndrome 12/23/2013   Type 2 diabetes mellitus with other diabetic kidney complication ([place]) 48/25/0037   Disturbance of skin sensation 09/02/2006   Difficulty hearing 07/25/2006   Cephalalgia 05/27/2006   Hyperlipidemia associated with type 2 diabetes mellitus ([place]) 10/22/2005   Arthritis, degenerative 10/22/2005    Immunization History  Administered Date(s) Administered   Fluad Quad(high Dose 65+) 01/07/2021   Influenza Inj Mdck Quad Pf 12/20/2018   Influenza Split 01/05/2006, 10/31/2007, 03/03/2009, 12/16/2009, 12/23/2010, 12/24/2011, 12/19/2013   Influenza,inj,Quad PF,6+ Mos 11/23/2012, 11/07/2015, 11/15/2017   Influenza-Unspecified 11/07/2015, 11/03/2016   PFIZER(Purple Top)SARS-COV-2 Vaccination 03/01/2019, 03/22/2019   Pneumococcal Polysaccharide-23 12/23/2010   Zoster Recombinat (Shingrix) 06/03/2016, 12/11/2016    Conditions to be addressed/monitored:  Hypertension, Hyperlipidemia, Diabetes, Heart Failure, Coronary Artery Disease, COPD, Chronic Kidney Disease, Depression, Anxiety, Osteoporosis, Tobacco use, and History of stroke    There are no care plans that you recently modified to display for this patient.     Medication Assistance: None required.  Patient affirms current coverage meets needs.  Compliance/Adherence/Medication fill history: Care Gaps: Tetanus Vaccine Pneumonia Vaccine Urine Microalbumin COVID-19 Vaccine Foot Exam  Ophthalmology Exam A1C 11.7% on 08/20/2021  Star-Rating Drugs: Atorvastatin 40 mg last filled 05/09/2020 [facility]. Trulicity 3 mg last filled 04/18/2021 for 84 day supply at [facility].  Patient's preferred pharmacy is:  [id number] [place] [id number]: [phone]: [phone] Uses pill box? Yes Pt endorses 100% compliance  We discussed: Current pharmacy is preferred with insurance plan and patient is satisfied with pharmacy services Patient decided to: Continue current medication management strategy  Care Plan and Follow Up Patient Decision:  Patient agrees to Care Plan and Follow-up.  Plan: Telephone follow up appointment with care management team member scheduled for:  10/16/2021 at 1:00 PM  AJunius Argyle PharmD, BPara [place] CPP  Clinical Pharmacist Practitioner  [phone] Current Barriers:  No barriers noted  Pharmacist Clinical Goal(s):  Patient will maintain control of heart failure as evidenced by lack of exacerbations  through collaboration with PharmD and provider.   Interventions: 1:1 collaboration with PGwyneth Sprout FNP regarding development and update of comprehensive plan of care as evidenced by provider attestation and co-signature Inter-disciplinary care team collaboration (see longitudinal plan of care) Comprehensive medication review performed; medication list updated in electronic medical record  Heart Failure (Goal: manage symptoms and prevent exacerbations) -Controlled -Last ejection fraction: 60-65% (Date:  2019) -HF type: HF w/recovered EF  -NYHA Class: II (slight limitation of activity) -Current treatment: Carvedilol 12.5 mg twice daily  Torsemide 20 mg every Mon/Wed/Fri  -Medications previously tried: HCTZ, Losartan (AKI), Metoprolol, Spironolactone  -Recommended to continue current medication  Hyperlipidemia: (LDL goal < 55) -Controlled -Stenting July 5th  -Current treatment: Atorvastatin 40  mg daily  -Current treatment: Clopidogrel 75 mg daily  -Medications previously tried: NA  -Recommended to continue current medication  Diabetes (A1c goal <8%) -Uncontrolled -Managed by Dr. Gabriel Carina  -Current medications: Trulicity 3 mg weekly on Mondays  Novolog via insulin pump  -Medications previously tried: Ozempic (nausea), glyburide/metformin (CKD),   -Current home glucose readings (uses freestyle [place])  -Denies hypoglycemic/hyperglycemic symptoms -Recommended to continue current medication  COPD (Goal: control symptoms and prevent exacerbations) -Controlled -Current treatment  Albuterol 2 puffs every 6 hours as needed  Flonase  Loratadine 10 mg daily  -Medications previously tried: NA  -Frequency of rescue inhaler use: <1x in past month.  -Recommended to continue current medication  Depression/Anxiety (Goal: Maintain symptom remission) -Controlled -Current treatment: Paroxetine 40 mg daily  -Medications previously tried/failed: Aripiprazole  -Recommended to continue current medication  Chronic DVTs (Goal: Prevent clots) -Controlled -Current treatment  Eliquis 5 mg twice daily  -Medications previously tried: NA  -Recommended to continue current medication  Chronic Kidney Disease Stage 4  -All medications assessed for renal dosing and appropriateness in chronic kidney disease. -told by Dr. Lanora Manis 4 glasses of water daily.  -Drinks unsweetened tea 4-5 glasses. 2-3 of water.   -Recommended to continue current medication  Patient Goals/Self-Care Activities Patient  will:  - weigh daily, and contact provider if weight gain of greater than 2 pounds in 24 hours  Follow Up Plan: ***

## 2021-10-19 ENCOUNTER — Ambulatory Visit: Payer: Medicare Other | Admitting: Cardiology

## 2021-10-19 NOTE — Patient Instructions (Signed)
Visit Information It was great speaking with you today!  Please let me know if you have any questions about our visit.   Goals Addressed   None     Patient Care Plan: General Pharmacy (Adult)     Problem Identified: Hypertension, Hyperlipidemia, Diabetes, Heart Failure, Coronary Artery Disease, COPD, Chronic Kidney Disease, Depression, Anxiety, Osteoporosis, Tobacco use, and History of stroke   Priority: High     Long-Range Goal: Patient-Specific Goal   Start Date: 10/07/2021  Expected End Date: 10/08/2022  This Visit's Progress: On track  Recent Progress: On track  Priority: High  Note:   Current Barriers:  No barriers noted  Pharmacist Clinical Goal(s):  Patient will maintain control of heart failure as evidenced by lack of exacerbations  through collaboration with PharmD and provider.   Interventions: 1:1 collaboration with Gwyneth Sprout, FNP regarding development and update of comprehensive plan of care as evidenced by provider attestation and co-signature Inter-disciplinary care team collaboration (see longitudinal plan of care) Comprehensive medication review performed; medication list updated in electronic medical record  Heart Failure (Goal: manage symptoms and prevent exacerbations) -Controlled -Last ejection fraction: 60-65% (Date: 2019) -HF type: HF w/recovered EF  -NYHA Class: II (slight limitation of activity) -Current treatment: Carvedilol 12.5 mg twice daily  Torsemide 20 mg daily -Medications previously tried: HCTZ, Losartan (AKI), Metoprolol, Spironolactone  -Recommended to continue current medication  Hyperlipidemia: (LDL goal < 55) -Controlled -Stenting July 5th  -Current treatment: Atorvastatin 40 mg daily  -Current treatment: Clopidogrel 75 mg daily  -Medications previously tried: NA  -Recommended to continue current medication  Diabetes (A1c goal <8%) -Uncontrolled -Managed by Dr. Gabriel Carina  -Current medications: Trulicity 3 mg weekly on  Mondays  Novolog via insulin pump  -Medications previously tried: Ozempic (nausea), glyburide/metformin (CKD),   -Current home glucose readings (uses freestyle [place])  -Denies hypoglycemic/hyperglycemic symptoms -Recommended to continue current medication  COPD (Goal: control symptoms and prevent exacerbations) -Controlled -Current treatment  Albuterol 2 puffs every 6 hours as needed  Flonase  Loratadine 10 mg daily  -Medications previously tried: NA  -Frequency of rescue inhaler use: <1x in past month.  -Recommended to continue current medication  Depression/Anxiety (Goal: Maintain symptom remission) -Controlled -Current treatment: Paroxetine 40 mg daily  -Medications previously tried/failed: Aripiprazole  -Recommended to continue current medication  Chronic DVTs (Goal: Prevent clots) -Controlled -Current treatment  Eliquis 5 mg twice daily  -Medications previously tried: NA  -Recommended to continue current medication  Chronic Kidney Disease Stage 4  -All medications assessed for renal dosing and appropriateness in chronic kidney disease. -told by Dr. Lanora Manis 4 glasses of water daily.  -Drinks unsweetened tea 4-5 glasses. 2-3 of water.   -Recommended to continue current medication  Patient Goals/Self-Care Activities Patient will:  - weigh daily, and contact provider if weight gain of greater than 2 pounds in 24 hours  Follow Up Plan: Telephone follow up appointment with care management team member scheduled for:  01/18/2022 at 3:45 PM      Patient agreed to services and verbal consent obtained.   Patient verbalizes understanding of instructions and care plan provided today and agrees to view in [place]. Active MyChart status and patient understanding of how to access instructions and care plan via MyChart confirmed with patient.     Junius Argyle, PharmD, Para [place], CPP  Clinical Pharmacist Practitioner  [facility] [phone]

## 2021-10-21 NOTE — Progress Notes (Signed)
Cardiology Clinic Note   Patient Name: Jocelyn Wells Date of Encounter: 10/23/2021  Primary Care Provider:  Gwyneth Sprout, FNP Primary Cardiologist:  None  Patient Profile    66 year old female with a history of anxiety, congestive heart failure, paroxysmal atrial fibrillation, hypertension, diabetes, former smoker, COPD, CVA, DVT, CKD, lumbar spinal stenosis causing back pain, who is here today to follow-up on shortness of breath and peripheral edema.  Past Medical History    Past Medical History:  Diagnosis Date   Anxiety    Cardiomyopathy [facility])    new to her Jan 2017   Chronic kidney disease    COPD (chronic obstructive pulmonary disease) ([place])    Depression    Diabetes mellitus, type II ([place])    HTN (hypertension)    Obesity    Osteoporosis    PONV (postoperative nausea and vomiting)    Stroke [facility])    Jan 2017   Past Surgical History:  Procedure Laterality Date   ABDOMINAL HYSTERECTOMY     ANKLE FRACTURE SURGERY Left 2002   BILATERAL SALPINGOOPHORECTOMY  2000   CARDIAC CATHETERIZATION N/A 03/25/2015   Procedure: Right/Left Heart Cath and Coronary Angiography;  Surgeon: Belva Crome, MD;  Location: [place] CV LAB;  Service: Cardiovascular;  Laterality: N/A;   CESAREAN SECTION  1978   COLONOSCOPY WITH PROPOFOL N/A 09/22/2016   Procedure: COLONOSCOPY WITH PROPOFOL;  Surgeon: Manya Silvas, MD;  Location: [facility] ENDOSCOPY;  Service: Endoscopy;  Laterality: N/A;   EP IMPLANTABLE DEVICE N/A 08/05/2015   Procedure: Loop Recorder Insertion;  Surgeon: Deboraha Sprang, MD;  Location: [place] CV LAB;  Service: Cardiovascular;  Laterality: N/A;   ESOPHAGOGASTRODUODENOSCOPY (EGD) WITH PROPOFOL N/A 09/22/2016   Procedure: ESOPHAGOGASTRODUODENOSCOPY (EGD) WITH PROPOFOL;  Surgeon: Manya Silvas, MD;  Location: [facility] ENDOSCOPY;  Service: Endoscopy;  Laterality: N/A;   ESOPHAGOGASTRODUODENOSCOPY (EGD) WITH PROPOFOL N/A 12/08/2016   Procedure:  ESOPHAGOGASTRODUODENOSCOPY (EGD) WITH PROPOFOL;  Surgeon: Manya Silvas, MD;  Location: [facility] ENDOSCOPY;  Service: Endoscopy;  Laterality: N/A;   KNEE ARTHROSCOPY Left 2005   LOWER EXTREMITY ANGIOGRAPHY Left 08/12/2021   Procedure: Lower Extremity Angiography;  Surgeon: Katha Cabal, MD;  Location: [place] [facility] LAB;  Service: Cardiovascular;  Laterality: Left;   TEE WITHOUT CARDIOVERSION N/A 01/31/2015   Procedure: TRANSESOPHAGEAL ECHOCARDIOGRAM (TEE);  Surgeon: Lelon Perla, MD;  Location: [facility] ENDOSCOPY;  Service: Cardiovascular;  Laterality: N/A;   TEMPORARY DIALYSIS CATHETER N/A 08/14/2021   Procedure: TEMPORARY DIALYSIS CATHETER;  Surgeon: Katha Cabal, MD;  Location: [place] [facility] LAB;  Service: Cardiovascular;  Laterality: N/A;   TUBAL LIGATION  1978   ULNAR NERVE TRANSPOSITION  2008    Allergies  Allergies  Allergen Reactions   Codeine Nausea And Vomiting    History of Present Illness    66 year old female with history anxiety, CHF (EF 25 to 45% in 2017 that normalized to 60 to 65% in 2019) paroxysmal atrial fibrillation on apaxiban, hypertension, diabetes, former smoker x40+ years, COPD, CVA due to thrombosis of the left middle cerebral artery status post ILR in 2017, DVT apixaban, CKD, lumbar spinal stenosis with back pain.  Patient was last seen in clinic 07/17/2021 where she has been having worsening leg edema over the last month.  She had been followed by vein and vascular clinic for lower extremity was wrapped with dressings.  Stated she had quit smoking approximately 2 months prior.  Denied any chest discomfort but endorsed worsening shortness of  breath.  She had a loop recorder that was placed potentially after her CVA.  ILR check with no evidence for atrial fibrillation.  Left heart catheterization in 2017 revealed normal coronary arteries, echocardiogram in 2017 revealed LVEF of 40-45%, repeat echocardiogram in 2019 revealed EF of 60-65%.  Last  echocardiogram revealed LVEF of 45 to 50%, left ventricle is mildly decreased function, no regional wall motion abnormalities, moderate left ventricular hypertrophy, G1 DD (impaired relaxation), aortic valve sclerosis/calcification is present.  08/12/2021 patient did undergo a percutaneous transluminal angioplasty and stent placement to the left superficial femoral artery and popliteal to 5 mm proximally and 4 mm distally, percutaneous transluminal angioplasty of the left common iliac artery, percutaneous transluminal angioplasty of the left peroneal artery, mechanical thrombectomy of the left tibioperoneal trunk and peroneal, and Star closure of the right common femoral arteriotomy.  Her postoperative.  Was complicated by worsening renal function which required the initiation of dialysis.  Temporary dialysis catheter was placed in the right femoral.  She was able to revert back to his stage IV chronic kidney disease with outpatient follow-up with nephrology to determine if she would like to pursue hemodialysis versus peritoneal dialysis.  Subsequently she was discharged from the hospital on 08/23/2021  She returns to clinic today stating that she has been doing OK. They have just returned from a trip to [place]. She is concerned over the ulcer she has on the left lower extremity. She has a home health nurse that comes to the home to dress the wound several times a week but they are worried she may have to start going to the wound clinic for treatment because even after having her arterial procedures by Vascular the area is still not healing. She has had the wound since May after a fall. She denies any chest pain, worsening shortness of breath, or palpitations, but does continue to have peripheral edema. She denies any recent hospitalizations or visits to the emergency department.   Home Medications    Current Outpatient Medications  Medication Sig Dispense Refill   albuterol (VENTOLIN HFA) 108 (90 Base)  MCG/ACT inhaler TAKE 2 PUFFS BY MOUTH EVERY 6 HOURS AS NEEDED FOR WHEEZE OR SHORTNESS OF BREATH 8.5 each 2   apixaban (ELIQUIS) 5 MG TABS tablet Take 1 tablet (5 mg total) by mouth 2 (two) times daily. 180 tablet 3   atorvastatin (LIPITOR) 40 MG tablet Take 1 tablet (40 mg total) by mouth daily. 90 tablet 3   carvedilol (COREG) 12.5 MG tablet Take 1 tablet (12.5 mg total) by mouth 2 (two) times daily with a meal. 60 tablet 3   Dulaglutide 3 MG/0.5ML SOPN Inject into the skin once a week.     fluticasone (FLONASE) 50 MCG/ACT nasal spray Place 2 sprays into both nostrils daily. 16 g 6   gabapentin (NEURONTIN) 300 MG capsule Take 300 mg by mouth 2 (two) times daily.     glucose blood test strip OneTouch Verio strips     insulin aspart (NOVOLOG) 100 UNIT/ML injection Insulin pump     Insulin Disposable Pump (OMNIPOD DASH PODS, GEN 4,) MISC USE 1 POD EACH EVERY 72    HOURS     Insulin Human (INSULIN PUMP) SOLN Per endocrinoloy     loratadine (CLARITIN) 10 MG tablet Take 1 tablet by mouth daily.     losartan (COZAAR) 25 MG tablet Take 25 mg by mouth 2 (two) times daily.     pantoprazole (PROTONIX) 40 MG tablet Take 1 tablet (40  mg total) by mouth 2 (two) times daily. 60 tablet 0   PARoxetine (PAXIL) 40 MG tablet Take 1 tablet (40 mg total) by mouth daily. 90 tablet 1   silver sulfADIAZINE (SILVADENE) 1 % cream Apply 1 Application topically daily. 400 g 0   Spacer/Aero-Holding Chambers (OPTICHAMBER DIAMOND) MISC Use with inhaler to ensure medication is delivered throughout lungs 1 each 0   torsemide (DEMADEX) 20 MG tablet Take 20 mg by mouth daily.     traMADol (ULTRAM) 50 MG tablet Take 50 mg by mouth 2 (two) times daily as needed.     clopidogrel (PLAVIX) 75 MG tablet Take 1 tablet (75 mg total) by mouth daily. 90 tablet 3   oxyCODONE (OXY IR/ROXICODONE) 5 MG immediate release tablet Take 1 tablet (5 mg total) by mouth every 8 (eight) hours as needed for severe pain. (Patient not taking: Reported on  10/23/2021) 15 tablet 0   Vitamin D, Ergocalciferol, (DRISDOL) 1.25 MG (50000 UT) CAPS capsule Take 50,000 Units by mouth once a week. (Sundays) (Patient not taking: Reported on 10/23/2021)     No current facility-administered medications for this visit.     Family History    Family History  Problem Relation Age of Onset   Heart disease Mother        died from CHF   Asthma Mother    Diabetes Mother    Heart disease Father    Aneurysm Father    COPD Brother    Diabetes Brother    Alcohol abuse Paternal Aunt    Anemia Neg Hx    Arrhythmia Neg Hx    Clotting disorder Neg Hx    Fainting Neg Hx    Heart attack Neg Hx    Heart failure Neg Hx    Hyperlipidemia Neg Hx    Hypertension Neg Hx    She indicated that her mother is deceased. She indicated that her father is deceased. She indicated that both of her brothers are deceased. She indicated that her maternal grandfather is deceased. She indicated that the status of her paternal aunt is unknown. She indicated that the status of her neg hx is unknown.  Social History    Social History   Socioeconomic History   Marital status: Married    Spouse name: Not on file   Number of children: 2   Years of education: Not on file   Highest education level: Bachelor's degree (e.g., BA, AB, BS)  Occupational History   Occupation: disabled    Comment: retired  Tobacco Use   Smoking status: Former    Packs/day: 2.00    Years: 36.00    Total pack years: 72.00    Types: Cigarettes    Start date: 02/08/1974    Quit date: 03/11/2020    Years since quitting: 1.6   Smokeless tobacco: Never   Tobacco comments:    Quit 03/2020  Vaping Use   Vaping Use: Never used  Substance and Sexual Activity   Alcohol use: No    Alcohol/week: 0.0 standard drinks of alcohol   Drug use: No   Sexual activity: Yes    Birth control/protection: None  Other Topics Concern   Not on file  Social History Narrative   Lives at home with stepson and husband, dogs  and cats   Caffeine  Drinks sweet tea.   Right handed.    Social Determinants of Health   Financial Resource Strain: Low Risk  (10/07/2021)   Overall Financial Resource Strain (CARDIA)  Difficulty of Paying Living Expenses: Not hard at all  Food Insecurity: No Food Insecurity (05/27/2021)   Hunger Vital Sign    Worried About Running Out of Food in the Last Year: Never true    Ran Out of Food in the Last Year: Never true  Transportation Needs: No Transportation Needs (10/07/2021)   PRAPARE - Hydrologist (Medical): No    Lack of Transportation (Non-Medical): No  Physical Activity: Inactive (05/27/2021)   Exercise Vital Sign    Days of Exercise per Week: 0 days    Minutes of Exercise per Session: 0 min  Stress: No Stress Concern Present (05/27/2021)   [place]    Feeling of Stress : Not at all  Social Connections: Moderately Isolated (05/27/2021)   Social Connection and Isolation Panel [NHANES]    Frequency of Communication with Friends and Family: More than three times a week    Frequency of Social Gatherings with Friends and Family: Once a week    Attends Religious Services: Never    Marine scientist or Organizations: No    Attends Archivist Meetings: Never    Marital Status: Married  Human resources officer Violence: Not At Risk (05/27/2021)   Humiliation, Afraid, Rape, and Kick questionnaire    Fear of Current or Ex-Partner: No    Emotionally Abused: No    Physically Abused: No    Sexually Abused: No     Review of Systems    General:  No chills, fever, night sweats or weight changes. Endorses fatigue Cardiovascular:  No chest pain, endorses chronic dyspnea on exertion, edema, orthopnea, palpitations, paroxysmal nocturnal dyspnea. Dermatological: No rash, lesions/masses, endorses long standing ulceration of the left lower extremity Respiratory: No cough, endorses chronic  dyspnea Urologic: No hematuria, dysuria Abdominal:   No nausea, vomiting, diarrhea, bright red blood per rectum, melena, or hematemesis Neurologic:  No visual changes, wkns, changes in mental status. All other systems reviewed and are otherwise negative except as noted above.     Physical Exam    VS:  BP 124/70 (BP Location: Left Arm, Patient Position: Sitting, Cuff Size: Normal)   Pulse 66   Ht '5\' 6"'$  (1.676 m)   Wt 228 lb (103.4 kg)   SpO2 94%   BMI 36.80 kg/m  , BMI Body mass index is 36.8 kg/m.     GEN: Well nourished, well developed, in no acute distress. HEENT: normal. Glasses on. Neck: Supple, no JVD, carotid bruits, or masses. Cardiac: RRR, no murmurs, rubs, or gallops. No clubbing, cyanosis, edema.  Radials/DP/PT 2+ and equal bilaterally.  Respiratory:  Respirations regular and unlabored, diminished to auscultation bilaterally. GI: Soft, nontender, nondistended, obese,  BS + x 4. MS: no deformity or atrophy. Skin: warm and dry, no rash. Bandage  to the LLE not removed to assess site C/D/I Neuro:  Strength and sensation are intact. Psych: Normal affect.  Accessory Clinical Findings    ECG personally reviewed by me today-sinus rhythm rate of 66, right bundle branch block, left anterior fascicular block for bilateral fascicular block, LVH, T wave inversion V1 V2- No acute changes  Lab Results  Component Value Date   WBC 12.8 (H) 09/02/2021   HGB 10.8 (L) 09/02/2021   HCT 35.8 09/02/2021   MCV 84 09/02/2021   PLT 499 (H) 09/02/2021   Lab Results  Component Value Date   CREATININE 3.35 (H) 09/02/2021   BUN 104 (HH) 09/02/2021  NA 141 09/02/2021   K 4.7 09/02/2021   CL 105 09/02/2021   CO2 18 (L) 09/02/2021   Lab Results  Component Value Date   ALT 12 09/02/2021   AST 11 09/02/2021   ALKPHOS 62 09/02/2021   BILITOT 0.2 09/02/2021   Lab Results  Component Value Date   CHOL 271 (H) 10/20/2020   HDL 41 10/20/2020   LDLCALC 186 (H) 10/20/2020   TRIG 231  (H) 10/20/2020   CHOLHDL 6.6 (H) 10/20/2020    Lab Results  Component Value Date   HGBA1C 7.9 (A) 07/14/2021    Assessment & Plan   1.  Nonischemic cardiomyopathy with last LVEF 45-50% on 09/29/2021.  She continues to have chronic shortness of breath and peripheral edema.  She states that they are unchanged today.  She does state since starting torsemide that the swelling has improved to her bilateral lower extremities overall. If breathing were to worsen, at that time may benefit from [place], which requires no IVP dye and would not impact her kidney function. She has been continued on carvedilol 12.5 mg twice daily, losartan 25 mg daily, and torsemide 20 mg daily.  2.  Hypertension with blood pressure today 120/70 she has been continued on her carvedilol and losartan.  She is continue to monitor blood pressure at home.  3.  Hyperlipidemia with previous LDL of 186 10/20/2020.  She is continued on atorvastatin 40 mg daily.  This continues to be followed by her PCP we will await updated results.  4.  Atherosclerosis of the native arteries of the extremities with ulcerations she has just underwent percutaneous transluminal angioplasty and stent placement on the left.  Unfortunately was complicated by acute renal failure and required dialysis during her hospitalization.  She continues to have ulcer wrapped to the left lower extremity.  She remains on clopidogrel which she has added today and is requesting a refill for 90 days sent to the pharmacy of choice . She continues to be followed by vascular  5.  CKD stage IV with a last serum creatinine of 3.25 on 10/05/2021. This continues to be followed closely by nephrology. Since her discharge from the hospital she has not required any further dialysis treatments.  6.  Chronic DVT of the femoral vein of the right lower extremity which she has been continued on chronic anticoagulation of Eliquis 5 mg twice daily.  7.  Chronic obstructive pulmonary disease  which is longstanding she has been continued on her pulmonary medications as ordered.  This continues to be followed by pulmonary.  8.  Disposition: Patient return to clinic to see MD/APP in 3 months or sooner if needed.  Tracker Mance, NP 10/23/2021, 1:18 PM

## 2021-10-22 ENCOUNTER — Telehealth: Payer: Self-pay | Admitting: Family Medicine

## 2021-10-22 DIAGNOSIS — I110 Hypertensive heart disease with heart failure: Secondary | ICD-10-CM | POA: Diagnosis not present

## 2021-10-22 DIAGNOSIS — Z48812 Encounter for surgical aftercare following surgery on the circulatory system: Secondary | ICD-10-CM | POA: Diagnosis not present

## 2021-10-22 DIAGNOSIS — I5033 Acute on chronic diastolic (congestive) heart failure: Secondary | ICD-10-CM | POA: Diagnosis not present

## 2021-10-22 DIAGNOSIS — N184 Chronic kidney disease, stage 4 (severe): Secondary | ICD-10-CM | POA: Diagnosis not present

## 2021-10-22 DIAGNOSIS — E1122 Type 2 diabetes mellitus with diabetic chronic kidney disease: Secondary | ICD-10-CM | POA: Diagnosis not present

## 2021-10-22 DIAGNOSIS — E1151 Type 2 diabetes mellitus with diabetic peripheral angiopathy without gangrene: Secondary | ICD-10-CM | POA: Diagnosis not present

## 2021-10-22 NOTE — Telephone Encounter (Signed)
Leigh Aurora calling from [facility] is calling for verbal orders for wound care. Frequency- 2w9 for education & Diabetes.  Cb- [phone] Verbal on VM

## 2021-10-23 ENCOUNTER — Encounter: Payer: Self-pay | Admitting: Cardiology

## 2021-10-23 ENCOUNTER — Ambulatory Visit: Payer: Medicare Other | Attending: Cardiology | Admitting: Cardiology

## 2021-10-23 VITALS — BP 124/70 | HR 66 | Ht 66.0 in | Wt 228.0 lb

## 2021-10-23 DIAGNOSIS — I7025 Atherosclerosis of native arteries of other extremities with ulceration: Secondary | ICD-10-CM | POA: Diagnosis not present

## 2021-10-23 DIAGNOSIS — I10 Essential (primary) hypertension: Secondary | ICD-10-CM | POA: Diagnosis not present

## 2021-10-23 DIAGNOSIS — Z7985 Long-term (current) use of injectable non-insulin antidiabetic drugs: Secondary | ICD-10-CM | POA: Diagnosis not present

## 2021-10-23 DIAGNOSIS — Z79899 Other long term (current) drug therapy: Secondary | ICD-10-CM | POA: Diagnosis not present

## 2021-10-23 DIAGNOSIS — F419 Anxiety disorder, unspecified: Secondary | ICD-10-CM | POA: Diagnosis not present

## 2021-10-23 DIAGNOSIS — N184 Chronic kidney disease, stage 4 (severe): Secondary | ICD-10-CM

## 2021-10-23 DIAGNOSIS — J449 Chronic obstructive pulmonary disease, unspecified: Secondary | ICD-10-CM | POA: Diagnosis not present

## 2021-10-23 DIAGNOSIS — I70248 Atherosclerosis of native arteries of left leg with ulceration of other part of lower left leg: Secondary | ICD-10-CM | POA: Diagnosis not present

## 2021-10-23 DIAGNOSIS — I428 Other cardiomyopathies: Secondary | ICD-10-CM | POA: Diagnosis not present

## 2021-10-23 DIAGNOSIS — Z79891 Long term (current) use of opiate analgesic: Secondary | ICD-10-CM | POA: Diagnosis not present

## 2021-10-23 DIAGNOSIS — E782 Mixed hyperlipidemia: Secondary | ICD-10-CM

## 2021-10-23 DIAGNOSIS — I82511 Chronic embolism and thrombosis of right femoral vein: Secondary | ICD-10-CM | POA: Insufficient documentation

## 2021-10-23 DIAGNOSIS — I480 Paroxysmal atrial fibrillation: Secondary | ICD-10-CM | POA: Diagnosis not present

## 2021-10-23 DIAGNOSIS — Z7902 Long term (current) use of antithrombotics/antiplatelets: Secondary | ICD-10-CM | POA: Diagnosis not present

## 2021-10-23 DIAGNOSIS — I110 Hypertensive heart disease with heart failure: Secondary | ICD-10-CM | POA: Diagnosis not present

## 2021-10-23 DIAGNOSIS — Z87891 Personal history of nicotine dependence: Secondary | ICD-10-CM | POA: Diagnosis not present

## 2021-10-23 DIAGNOSIS — Z8673 Personal history of transient ischemic attack (TIA), and cerebral infarction without residual deficits: Secondary | ICD-10-CM | POA: Diagnosis not present

## 2021-10-23 DIAGNOSIS — Z95820 Peripheral vascular angioplasty status with implants and grafts: Secondary | ICD-10-CM | POA: Diagnosis not present

## 2021-10-23 DIAGNOSIS — Z86718 Personal history of other venous thrombosis and embolism: Secondary | ICD-10-CM | POA: Diagnosis not present

## 2021-10-23 DIAGNOSIS — Z9181 History of falling: Secondary | ICD-10-CM | POA: Diagnosis not present

## 2021-10-23 DIAGNOSIS — L97828 Non-pressure chronic ulcer of other part of left lower leg with other specified severity: Secondary | ICD-10-CM | POA: Diagnosis not present

## 2021-10-23 DIAGNOSIS — Z7901 Long term (current) use of anticoagulants: Secondary | ICD-10-CM | POA: Diagnosis not present

## 2021-10-23 DIAGNOSIS — E1122 Type 2 diabetes mellitus with diabetic chronic kidney disease: Secondary | ICD-10-CM | POA: Diagnosis not present

## 2021-10-23 DIAGNOSIS — Z794 Long term (current) use of insulin: Secondary | ICD-10-CM | POA: Diagnosis not present

## 2021-10-23 DIAGNOSIS — M48061 Spinal stenosis, lumbar region without neurogenic claudication: Secondary | ICD-10-CM | POA: Diagnosis not present

## 2021-10-23 DIAGNOSIS — E1151 Type 2 diabetes mellitus with diabetic peripheral angiopathy without gangrene: Secondary | ICD-10-CM | POA: Diagnosis not present

## 2021-10-23 DIAGNOSIS — I70201 Unspecified atherosclerosis of native arteries of extremities, right leg: Secondary | ICD-10-CM | POA: Diagnosis not present

## 2021-10-23 DIAGNOSIS — I5032 Chronic diastolic (congestive) heart failure: Secondary | ICD-10-CM | POA: Diagnosis not present

## 2021-10-23 DIAGNOSIS — Z6835 Body mass index (BMI) 35.0-35.9, adult: Secondary | ICD-10-CM | POA: Diagnosis not present

## 2021-10-23 MED ORDER — CLOPIDOGREL BISULFATE 75 MG PO TABS: 75.0000 mg | ORAL_TABLET | Freq: Every day | ORAL | 3 refills | Status: DC

## 2021-10-23 NOTE — Telephone Encounter (Signed)
Verbal orders given  

## 2021-10-23 NOTE — Patient Instructions (Signed)
Medication Instructions:  Your physician recommends that you continue on your current medications as directed. Please refer to the Current Medication list given to you today.  *If you need a refill on your cardiac medications before your next appointment, please call your pharmacy*   Follow-Up: At [facility], you and your health needs are our priority.  As part of our continuing mission to provide you with exceptional heart care, we have created designated Provider Care Teams.  These Care Teams include your primary Cardiologist (physician) and Advanced Practice Providers (APPs -  Physician Assistants and Nurse Practitioners) who all work together to provide you with the care you need, when you need it.  We recommend signing up for the patient portal called "MyChart".  Sign up information is provided on this After Visit Summary.  MyChart is used to connect with patients for Virtual Visits (Telemedicine).  Patients are able to view lab/test results, encounter notes, upcoming appointments, etc.  Non-urgent messages can be sent to your provider as well.   To learn more about what you can do with MyChart, go to [email or web address].    Your next appointment:   3 month(s)  The format for your next appointment:   In Person  Provider:   Gerrie Nordmann, NP    Other Instructions   Important Information About Sugar

## 2021-10-26 DIAGNOSIS — E1122 Type 2 diabetes mellitus with diabetic chronic kidney disease: Secondary | ICD-10-CM | POA: Diagnosis not present

## 2021-10-26 DIAGNOSIS — E1151 Type 2 diabetes mellitus with diabetic peripheral angiopathy without gangrene: Secondary | ICD-10-CM | POA: Diagnosis not present

## 2021-10-26 DIAGNOSIS — I70248 Atherosclerosis of native arteries of left leg with ulceration of other part of lower left leg: Secondary | ICD-10-CM | POA: Diagnosis not present

## 2021-10-26 DIAGNOSIS — I5032 Chronic diastolic (congestive) heart failure: Secondary | ICD-10-CM | POA: Diagnosis not present

## 2021-10-26 DIAGNOSIS — N184 Chronic kidney disease, stage 4 (severe): Secondary | ICD-10-CM | POA: Diagnosis not present

## 2021-10-26 DIAGNOSIS — I110 Hypertensive heart disease with heart failure: Secondary | ICD-10-CM | POA: Diagnosis not present

## 2021-10-28 DIAGNOSIS — I110 Hypertensive heart disease with heart failure: Secondary | ICD-10-CM | POA: Diagnosis not present

## 2021-10-28 DIAGNOSIS — E1151 Type 2 diabetes mellitus with diabetic peripheral angiopathy without gangrene: Secondary | ICD-10-CM | POA: Diagnosis not present

## 2021-10-28 DIAGNOSIS — I5032 Chronic diastolic (congestive) heart failure: Secondary | ICD-10-CM | POA: Diagnosis not present

## 2021-10-28 DIAGNOSIS — N184 Chronic kidney disease, stage 4 (severe): Secondary | ICD-10-CM | POA: Diagnosis not present

## 2021-10-28 DIAGNOSIS — E1122 Type 2 diabetes mellitus with diabetic chronic kidney disease: Secondary | ICD-10-CM | POA: Diagnosis not present

## 2021-10-28 DIAGNOSIS — I70248 Atherosclerosis of native arteries of left leg with ulceration of other part of lower left leg: Secondary | ICD-10-CM | POA: Diagnosis not present

## 2021-10-29 DIAGNOSIS — R0602 Shortness of breath: Secondary | ICD-10-CM | POA: Diagnosis not present

## 2021-10-30 ENCOUNTER — Telehealth: Payer: Self-pay

## 2021-10-30 DIAGNOSIS — I5032 Chronic diastolic (congestive) heart failure: Secondary | ICD-10-CM | POA: Diagnosis not present

## 2021-10-30 DIAGNOSIS — I110 Hypertensive heart disease with heart failure: Secondary | ICD-10-CM | POA: Diagnosis not present

## 2021-10-30 DIAGNOSIS — J449 Chronic obstructive pulmonary disease, unspecified: Secondary | ICD-10-CM

## 2021-10-30 DIAGNOSIS — E1122 Type 2 diabetes mellitus with diabetic chronic kidney disease: Secondary | ICD-10-CM | POA: Diagnosis not present

## 2021-10-30 DIAGNOSIS — N184 Chronic kidney disease, stage 4 (severe): Secondary | ICD-10-CM | POA: Diagnosis not present

## 2021-10-30 DIAGNOSIS — I70248 Atherosclerosis of native arteries of left leg with ulceration of other part of lower left leg: Secondary | ICD-10-CM | POA: Diagnosis not present

## 2021-10-30 DIAGNOSIS — E1151 Type 2 diabetes mellitus with diabetic peripheral angiopathy without gangrene: Secondary | ICD-10-CM | POA: Diagnosis not present

## 2021-10-30 NOTE — Telephone Encounter (Signed)
ONO reviewed by Dr. Patsey Berthold- No OSA by split night study March 2017. She did have nocturnal hypoxia. Pt qualifies for 2L QHS.  Repeat ONO on 2L once started.   Patient is aware of results and voiced her understanding. She agrees with treatment plan.  ONO and O2 has been ordered.  Nothing further needed.

## 2021-11-02 DIAGNOSIS — I5032 Chronic diastolic (congestive) heart failure: Secondary | ICD-10-CM | POA: Diagnosis not present

## 2021-11-02 DIAGNOSIS — I70248 Atherosclerosis of native arteries of left leg with ulceration of other part of lower left leg: Secondary | ICD-10-CM | POA: Diagnosis not present

## 2021-11-02 DIAGNOSIS — E1151 Type 2 diabetes mellitus with diabetic peripheral angiopathy without gangrene: Secondary | ICD-10-CM | POA: Diagnosis not present

## 2021-11-02 DIAGNOSIS — N184 Chronic kidney disease, stage 4 (severe): Secondary | ICD-10-CM | POA: Diagnosis not present

## 2021-11-02 DIAGNOSIS — E1122 Type 2 diabetes mellitus with diabetic chronic kidney disease: Secondary | ICD-10-CM | POA: Diagnosis not present

## 2021-11-02 DIAGNOSIS — I110 Hypertensive heart disease with heart failure: Secondary | ICD-10-CM | POA: Diagnosis not present

## 2021-11-03 ENCOUNTER — Encounter: Payer: Self-pay | Admitting: Student in an Organized Health Care Education/Training Program

## 2021-11-03 ENCOUNTER — Ambulatory Visit
Payer: Medicare Other | Attending: Student in an Organized Health Care Education/Training Program | Admitting: Student in an Organized Health Care Education/Training Program

## 2021-11-03 VITALS — BP 147/80 | HR 72 | Temp 97.0°F | Ht 66.0 in | Wt 223.0 lb

## 2021-11-03 DIAGNOSIS — S32010S Wedge compression fracture of first lumbar vertebra, sequela: Secondary | ICD-10-CM | POA: Diagnosis not present

## 2021-11-03 DIAGNOSIS — I63033 Cerebral infarction due to thrombosis of bilateral carotid arteries: Secondary | ICD-10-CM | POA: Insufficient documentation

## 2021-11-03 DIAGNOSIS — S22000A Wedge compression fracture of unspecified thoracic vertebra, initial encounter for closed fracture: Secondary | ICD-10-CM | POA: Insufficient documentation

## 2021-11-03 DIAGNOSIS — M48061 Spinal stenosis, lumbar region without neurogenic claudication: Secondary | ICD-10-CM | POA: Insufficient documentation

## 2021-11-03 DIAGNOSIS — F17200 Nicotine dependence, unspecified, uncomplicated: Secondary | ICD-10-CM | POA: Insufficient documentation

## 2021-11-03 DIAGNOSIS — M48062 Spinal stenosis, lumbar region with neurogenic claudication: Secondary | ICD-10-CM | POA: Insufficient documentation

## 2021-11-03 DIAGNOSIS — G894 Chronic pain syndrome: Secondary | ICD-10-CM | POA: Insufficient documentation

## 2021-11-03 MED ORDER — TRAMADOL HCL 50 MG PO TABS: 50.0000 mg | ORAL_TABLET | Freq: Two times a day (BID) | ORAL | 3 refills | Status: AC | PRN

## 2021-11-03 NOTE — Progress Notes (Signed)
PROVIDER NOTE: Information contained herein reflects review and annotations entered in association with encounter. Interpretation of such information and data should be left to medically-trained personnel. Information provided to patient can be located elsewhere in the medical record under "Patient Instructions". Document created using STT-dictation technology, any transcriptional errors that may result from process are unintentional.    Patient: Jocelyn Wells  Service Category: E/M  Provider: Gillis Santa, MD  DOB: [birth year]  DOS: 11/03/2021  Specialty: Interventional Pain Management  MRN: [id number]  Setting: Ambulatory outpatient  PCP: Gwyneth Sprout, FNP  Type: Established Patient    Referring Provider: Gwyneth Sprout, FNP  Location: Office  Delivery: Face-to-face     HPI  [name], a 66 y.o. year old female, is here today because of her Lumbar foraminal stenosis [[name]]. Jocelyn Wells primary complain today is Back Pain (low)  Last encounter: My last encounter with her was on 04/15/2021 Pertinent problems: Jocelyn Wells has H/O: osteoarthritis; Anxiety, generalized; Arthritis, degenerative; Morbid obesity due to excess calories ([place]); Compression fracture of L1 lumbar vertebra ([place]); Compression fracture of body of thoracic vertebra ([place]); Lumbar foraminal stenosis (RIGHT L1); and Spinal stenosis of lumbar region with neurogenic claudication (L4-L5) on their pertinent problem list. Pain Assessment: Severity of Chronic pain is reported as a 5 /10. Location: Back Lower/raidates down legs, patient has a wound dressing on left lower leg. Onset: More than a month ago. Quality: Stabbing. Timing: Constant. Modifying factor(s): meds. Vitals:  height is 5' 6"  (1.676 m) and weight is 223 lb (101.2 kg). Her temperature is 97 F (36.1 C) (abnormal). Her blood pressure is 147/80 (abnormal) and her pulse is 72. Her oxygen saturation is 95%.   Reason for encounter: medication management.    Wells  presents today for medication management.  Unfortunately she sustained a fall last week.  She has a bandage/wrap on her left leg She also had a hospitalization for acute kidney injury that developed after contrast exposure for a lower extremity duplex study   Pharmacotherapy Assessment  Analgesic: Tramadol 50 mg twice daily as needed, quantity 60/month    Monitoring: [place] PMP: PDMP reviewed during this encounter.       Pharmacotherapy: No side-effects or adverse reactions reported. Compliance: No problems identified. Effectiveness: Clinically acceptable.  UDS:  Summary  Date Value Ref Range Status  10/02/2020 Note  Final    Comment:    ==================================================================== ToxASSU[employer] Select 13 (MW) ==================================================================== Test                             Result       Flag       Units  Drug Present and Declared for Prescription Verification   Tramadol                       >8065        EXPECTED   ng/mg creat   O-Desmethyltramadol            6076         EXPECTED   ng/mg creat   N-Desmethyltramadol            2955         EXPECTED   ng/mg creat    Source of tramadol is a prescription medication. O-desmethyltramadol    and N-desmethyltramadol are expected metabolites of tramadol.  ==================================================================== Test  Result    Flag   Units      Ref Range   Creatinine              62               mg/dL      >=20 ==================================================================== Declared Medications:  The flagging and interpretation on this report are based on the  following declared medications.  Unexpected results may arise from  inaccuracies in the declared medications.   **Note: The testing scope of this panel includes these medications:   Tramadol (Ultram)   **Note: The testing scope of this panel does not include the  following reported  medications:   Aspirin  Atorvastatin (Lipitor)  Denosumab (Prolia)  Dulaglutide (Trulicity)  Gabapentin (Neurontin)  Glyburide (Glucovance)  Hydrochlorothiazide (Hydrodiuril)  Insulin  Losartan (Cozaar)  Metformin (Glucovance)  Mupirocin (Bactroban)  Paroxetine (Paxil)  Vitamin D2 (Drisdol) ==================================================================== For clinical consultation, please call [phone]. ====================================================================       ROS  Constitutional: Denies any fever or chills Gastrointestinal: No reported hemesis, hematochezia, vomiting, or acute GI distress Musculoskeletal:  Mid back, low back pain, left leg pain Neurological: No reported episodes of acute onset apraxia, aphasia, dysarthria, agnosia, amnesia, paralysis, loss of coordination, or loss of consciousness  Medication Review  Dulaglutide, Omnipod DASH Pods (Gen 4), PARoxetine, albuterol, apixaban, atorvastatin, carvedilol, clopidogrel, fluticasone, gabapentin, glucose blood, insulin aspart, insulin pump, loratadine, losartan, optichamber diamond, pantoprazole, silver sulfADIAZINE, torsemide, and traMADol  History Review  Allergy: Jocelyn Wells is allergic to codeine. Drug: Jocelyn Wells  reports no history of drug use. Alcohol:  reports no history of alcohol use. Tobacco:  reports that she quit smoking about 19 months ago. Her smoking use included cigarettes. She started smoking about 47 years ago. She has a 72.00 pack-year smoking history. She has never used smokeless tobacco. Social: Jocelyn Wells  reports that she quit smoking about 19 months ago. Her smoking use included cigarettes. She started smoking about 47 years ago. She has a 72.00 pack-year smoking history. She has never used smokeless tobacco. She reports that she does not drink alcohol and does not use drugs. Medical:  has a past medical history of Anxiety, Cardiomyopathy ([place]), Chronic kidney disease,  COPD (chronic obstructive pulmonary disease) ([place]), Depression, Diabetes mellitus, type II ([place]), HTN (hypertension), Obesity, Osteoporosis, PONV (postoperative nausea and vomiting), and Stroke ([place]). Surgical: Ms. Cravey  has a past surgical history that includes Abdominal hysterectomy; Tubal ligation (1978); Cesarean section (1978); Knee arthroscopy (Left, 2005); Ulnar nerve transposition (2008); Bilateral salpingoophorectomy (2000); Ankle fracture surgery (Left, 2002); TEE without cardioversion (N/A, 01/31/2015); Cardiac catheterization (N/A, 03/25/2015); Cardiac catheterization (N/A, 08/05/2015); Esophagogastroduodenoscopy (egd) with propofol (N/A, 09/22/2016); Colonoscopy with propofol (N/A, 09/22/2016); Esophagogastroduodenoscopy (egd) with propofol (N/A, 12/08/2016); Lower Extremity Angiography (Left, 08/12/2021); and TEMPORARY DIALYSIS CATHETER (N/A, 08/14/2021). Family: family history includes Alcohol abuse in her paternal aunt; Aneurysm in her father; Asthma in her mother; COPD in her brother; Diabetes in her brother and mother; Heart disease in her father and mother.  Laboratory Chemistry Profile   Renal Lab Results  Component Value Date   BUN 104 (HH) 09/02/2021   CREATININE 3.35 (H) 09/02/2021   BCR 31 (H) 09/02/2021   GFRAA 33 (L) 04/23/2019   GFRNONAA 20 (L) 08/23/2021     Hepatic Lab Results  Component Value Date   AST 11 09/02/2021   ALT 12 09/02/2021   ALBUMIN 3.8 (L) 09/02/2021   ALKPHOS 62 09/02/2021     Electrolytes  Lab Results  Component Value Date   NA 141 09/02/2021   K 4.7 09/02/2021   CL 105 09/02/2021   CALCIUM 9.1 09/02/2021   MG 1.5 (L) 08/23/2021   PHOS 3.8 08/20/2021     Bone No results found for: "VD25OH", "VD125OH2TOT", "WV3710GY6", "RS8546EV0", "25OHVITD1", "25OHVITD2", "25OHVITD3", "TESTOFREE", "TESTOSTERONE"   Inflammation (CRP: Acute Phase) (ESR: Chronic Phase) Lab Results  Component Value Date   ESRSEDRATE 23 10/20/2020   LATICACIDVEN 1.4  05/31/2020       Note: Above Lab results reviewed.  Recent Imaging Review  VAS [place] ABI WITH/WO TBI  LOWER EXTREMITY DOPPLER STUDY  Patient Name:  Jocelyn Wells  Date of Exam:   10/15/2021 Medical Rec #: [id number]       Accession #:    [id number] Date of Birth: Nov 03, 1955       Patient Gender: F Patient Age:   87 years Exam Location:  [facility] Procedure:      VAS [place] ABI WITH/WO TBI Referring Phys: [name]  --------------------------------------------------------------------------------   Indications: Rest pain, and ulceration.   Vascular Interventions: 08/14/2021: Insertion of Temporary dialysis catheter                         Right Femoral Approach.  Comparison Study: 07/01/2021  Performing Technologist: Almira Coaster RVS    Examination Guidelines: A complete evaluation includes at minimum, Doppler waveform signals and systolic blood pressure reading at the level of bilateral brachial, anterior tibial, and posterior tibial arteries, when vessel segments are accessible. Bilateral testing is considered an integral part of a complete examination. Photoelectric Plethysmograph (PPG) waveforms and toe systolic pressure readings are included as required and additional duplex testing as needed. Limited examinations for reoccurring indications may be performed as noted.    ABI Findings: +---------+------------------+-----+--------+--------+ Right    Rt Pressure (mmHg)IndexWaveformComment  +---------+------------------+-----+--------+--------+ Brachial 164                                     +---------+------------------+-----+--------+--------+ ATA      97                0.59 biphasic         +---------+------------------+-----+--------+--------+ PTA      130               0.79 biphasic         +---------+------------------+-----+--------+--------+ Great Toe103               0.63 Abnormal          +---------+------------------+-----+--------+--------+  +---------+------------------+-----+---------+-------+ Left     Lt Pressure (mmHg)IndexWaveform Comment +---------+------------------+-----+---------+-------+ ATA      158               0.96 triphasic        +---------+------------------+-----+---------+-------+ PTA      81                0.49 biphasic         +---------+------------------+-----+---------+-------+ Great Toe124               0.76 Normal           +---------+------------------+-----+---------+-------+  +-------+-----------+-----------+------------+------------+ ABI/TBIToday's ABIToday's TBIPrevious ABIPrevious TBI +-------+-----------+-----------+------------+------------+ Right  .79        .63        1.23        .89          +-------+-----------+-----------+------------+------------+  Left   .96        .76        .76         .60          +-------+-----------+-----------+------------+------------+    Bilateral ABIs and TBIs appear increased compared to prior study on 07/01/2021. Right ABIs and TBIs appear decreased compared to prior study on 07/01/2021.   Summary: Right: Resting right ankle-brachial index indicates moderate right lower extremity arterial disease. The right toe-brachial index is abnormal.  Left: Resting left ankle-brachial index is within normal range. The left toe-brachial index is normal.  *See table(s) above for measurements and observations.      Electronically signed by [name] MD on 10/15/2021 at 4:31:23 PM.      Final    Note: Reviewed        Physical Exam  General appearance: Well nourished, well developed, and well hydrated. In no apparent acute distress Mental status: Alert, oriented x 3 (person, place, & time)       Respiratory: No evidence of acute respiratory distress Eyes: PERLA Vitals: BP (!) 147/80   Pulse 72   Temp (!) 97 F (36.1 C)   Ht 5' 6"  (1.676 m)   Wt 223 lb  (101.2 kg)   SpO2 95%   BMI 35.99 kg/m  BMI: Estimated body mass index is 35.99 kg/m as calculated from the following:   Height as of this encounter: 5' 6"  (1.676 m).   Weight as of this encounter: 223 lb (101.2 kg). Ideal: Ideal body weight: 59.3 kg (130 lb 11.7 oz) Adjusted ideal body weight: 76 kg (167 lb 10.2 oz)   Lumbar Spine Area Exam  Skin & Axial Inspection: No masses, redness, or swelling Alignment: Symmetrical Functional ROM: Pain restricted ROM affecting both sides Stability: No instability detected Muscle Tone/Strength: Functionally intact. No obvious neuro-muscular anomalies detected. Sensory (Neurological): Dermatomal pain pattern and neurogenic Palpation: No palpable anomalies       Provocative Tests: Hyperextension/rotation test: (+) bilaterally for facet joint pain. Lumbar quadrant test (Kemp's test): (+) bilateral for foraminal stenosis   Gait & Posture Assessment  Ambulation: Patient ambulates using a cane Gait: Significantly limited. Dependent on assistive device to ambulate Posture: Difficulty standing up straight, due to pain  Lower Extremity Exam      Side: Right lower extremity   Side: Left lower extremity  Stability: No instability observed           Stability: No instability observed          Skin & Extremity Inspection: Skin color, temperature, and hair growth are WNL. No peripheral edema or cyanosis. No masses, redness, swelling, asymmetry, or associated skin lesions. No contractures.   Skin & Extremity Inspection: Bandage, wrap in place  Functional ROM: Pain restricted ROM for hip and knee joints           Functional ROM: Pain restricted ROM for hip and knee joints          Muscle Tone/Strength: Functionally intact. No obvious neuro-muscular anomalies detected.   Muscle Tone/Strength: Functionally intact. No obvious neuro-muscular anomalies detected.  Sensory (Neurological): Musculoskeletal pain pattern         Sensory (Neurological): Musculoskeletal  pain pattern        DTR: Patellar: deferred today Achilles: deferred today Plantar: deferred today   DTR: Patellar: deferred today Achilles: deferred today Plantar: deferred today  Palpation: No palpable anomalies   Palpation: No palpable anomalies     Assessment  Status Diagnosis  Persistent Persistent Persistent 1. Lumbar foraminal stenosis (RIGHT L1)   2. Spinal stenosis of lumbar region with neurogenic claudication (L4-L5)   3. Compression fracture of body of thoracic vertebra (HCC)   4. Compression fracture of L1 vertebra, sequela   5. Cerebrovascular accident (CVA) due to bilateral thrombosis of carotid arteries ([place])   6. Tobacco use disorder   7. Chronic pain syndrome         Plan of Care   [name] has a current medication list which includes the following long-term medication(s): albuterol, apixaban, atorvastatin, carvedilol, fluticasone, loratadine, pantoprazole, paroxetine, optichamber diamond, and insulin pump.   Pharmacotherapy (Medications Ordered): Meds ordered this encounter  Medications   traMADol (ULTRAM) 50 MG tablet    Sig: Take 1 tablet (50 mg total) by mouth every 12 (twelve) hours as needed.    Dispense:  60 tablet    Refill:  3   Orders Placed This Encounter  Procedures   ToxASSURE Select 13 (MW), Urine    Volume: 30 ml(s). Minimum 3 ml of urine is needed. Document temperature of fresh sample. Indications: Long term (current) use of opiate analgesic [phone])    Order Specific Question:   Release to patient    Answer:   Immediate     Follow-up plan:   Return in about 4 months (around 02/25/2022) for Medication Management, in person.   Recent Visits No visits were found meeting these conditions. Showing recent visits within past 90 days and meeting all other requirements Today's Visits Date Type Provider Dept  11/03/21 Office Visit Gillis Santa, MD [facility]  Showing today's visits and meeting all other  requirements Future Appointments No visits were found meeting these conditions. Showing future appointments within next 90 days and meeting all other requirements  I discussed the assessment and treatment plan with the patient. The patient was provided an opportunity to ask questions and all were answered. The patient agreed with the plan and demonstrated an understanding of the instructions.  Patient advised to call back or seek an in-person evaluation if the symptoms or condition worsens.  Duration of encounter: 70mnutes.  Note by: BGillis Santa MD Date: 11/03/2021; Time: 2:22 PM

## 2021-11-03 NOTE — Progress Notes (Signed)
Nursing Pain Medication Assessment:  Safety precautions to be maintained throughout the outpatient stay will include: orient to surroundings, keep bed in low position, maintain call bell within reach at all times, provide assistance with transfer out of bed and ambulation.  Medication Inspection Compliance: Pill count conducted under aseptic conditions, in front of the patient. Neither the pills nor the bottle was removed from the patient's sight at any time. Once count was completed pills were immediately returned to the patient in their original bottle.  Medication: Tramadol (Ultram) Pill/Patch Count:  0 of 60 pills remain Pill/Patch Appearance: Markings consistent with prescribed medication Bottle Appearance: Standard pharmacy container. Clearly labeled. Filled Date: 07 / 18 / 2023 Last Medication intake:  Yesterday

## 2021-11-04 ENCOUNTER — Telehealth (INDEPENDENT_AMBULATORY_CARE_PROVIDER_SITE_OTHER): Payer: Self-pay | Admitting: Vascular Surgery

## 2021-11-04 DIAGNOSIS — N2581 Secondary hyperparathyroidism of renal origin: Secondary | ICD-10-CM | POA: Diagnosis not present

## 2021-11-04 DIAGNOSIS — E1151 Type 2 diabetes mellitus with diabetic peripheral angiopathy without gangrene: Secondary | ICD-10-CM | POA: Diagnosis not present

## 2021-11-04 DIAGNOSIS — R809 Proteinuria, unspecified: Secondary | ICD-10-CM | POA: Diagnosis not present

## 2021-11-04 DIAGNOSIS — I10 Essential (primary) hypertension: Secondary | ICD-10-CM | POA: Diagnosis not present

## 2021-11-04 DIAGNOSIS — I70248 Atherosclerosis of native arteries of left leg with ulceration of other part of lower left leg: Secondary | ICD-10-CM | POA: Diagnosis not present

## 2021-11-04 DIAGNOSIS — U071 COVID-19: Secondary | ICD-10-CM | POA: Diagnosis not present

## 2021-11-04 DIAGNOSIS — F32A Depression, unspecified: Secondary | ICD-10-CM | POA: Diagnosis not present

## 2021-11-04 DIAGNOSIS — N184 Chronic kidney disease, stage 4 (severe): Secondary | ICD-10-CM | POA: Diagnosis not present

## 2021-11-04 DIAGNOSIS — I129 Hypertensive chronic kidney disease with stage 1 through stage 4 chronic kidney disease, or unspecified chronic kidney disease: Secondary | ICD-10-CM | POA: Diagnosis not present

## 2021-11-04 DIAGNOSIS — I5032 Chronic diastolic (congestive) heart failure: Secondary | ICD-10-CM | POA: Diagnosis not present

## 2021-11-04 DIAGNOSIS — E663 Overweight: Secondary | ICD-10-CM | POA: Diagnosis not present

## 2021-11-04 DIAGNOSIS — E1122 Type 2 diabetes mellitus with diabetic chronic kidney disease: Secondary | ICD-10-CM | POA: Diagnosis not present

## 2021-11-04 DIAGNOSIS — I110 Hypertensive heart disease with heart failure: Secondary | ICD-10-CM | POA: Diagnosis not present

## 2021-11-04 DIAGNOSIS — I639 Cerebral infarction, unspecified: Secondary | ICD-10-CM | POA: Diagnosis not present

## 2021-11-04 DIAGNOSIS — I509 Heart failure, unspecified: Secondary | ICD-10-CM | POA: Diagnosis not present

## 2021-11-04 DIAGNOSIS — D631 Anemia in chronic kidney disease: Secondary | ICD-10-CM | POA: Diagnosis not present

## 2021-11-04 NOTE — Telephone Encounter (Signed)
Jocelyn Wells called and LVM stating she needed information on her 07/29/21 appointment. When calling back refer to specimen #[id number] and her number is [phone].

## 2021-11-04 NOTE — Telephone Encounter (Signed)
Called back and gave Dx code [phone]

## 2021-11-06 LAB — TOXASSURE SELECT 13 (MW), URINE

## 2021-11-07 DIAGNOSIS — E1122 Type 2 diabetes mellitus with diabetic chronic kidney disease: Secondary | ICD-10-CM

## 2021-11-07 DIAGNOSIS — N184 Chronic kidney disease, stage 4 (severe): Secondary | ICD-10-CM

## 2021-11-07 DIAGNOSIS — Z794 Long term (current) use of insulin: Secondary | ICD-10-CM

## 2021-11-07 DIAGNOSIS — E1159 Type 2 diabetes mellitus with other circulatory complications: Secondary | ICD-10-CM

## 2021-11-07 DIAGNOSIS — F32A Depression, unspecified: Secondary | ICD-10-CM

## 2021-11-07 DIAGNOSIS — E785 Hyperlipidemia, unspecified: Secondary | ICD-10-CM

## 2021-11-07 DIAGNOSIS — I509 Heart failure, unspecified: Secondary | ICD-10-CM

## 2021-11-09 DIAGNOSIS — E1151 Type 2 diabetes mellitus with diabetic peripheral angiopathy without gangrene: Secondary | ICD-10-CM | POA: Diagnosis not present

## 2021-11-09 DIAGNOSIS — U071 COVID-19: Secondary | ICD-10-CM | POA: Diagnosis not present

## 2021-11-09 DIAGNOSIS — I70248 Atherosclerosis of native arteries of left leg with ulceration of other part of lower left leg: Secondary | ICD-10-CM | POA: Diagnosis not present

## 2021-11-09 DIAGNOSIS — E1122 Type 2 diabetes mellitus with diabetic chronic kidney disease: Secondary | ICD-10-CM | POA: Diagnosis not present

## 2021-11-09 DIAGNOSIS — N184 Chronic kidney disease, stage 4 (severe): Secondary | ICD-10-CM | POA: Diagnosis not present

## 2021-11-09 DIAGNOSIS — I5032 Chronic diastolic (congestive) heart failure: Secondary | ICD-10-CM | POA: Diagnosis not present

## 2021-11-09 DIAGNOSIS — I10 Essential (primary) hypertension: Secondary | ICD-10-CM | POA: Diagnosis not present

## 2021-11-09 DIAGNOSIS — R809 Proteinuria, unspecified: Secondary | ICD-10-CM | POA: Diagnosis not present

## 2021-11-09 DIAGNOSIS — I129 Hypertensive chronic kidney disease with stage 1 through stage 4 chronic kidney disease, or unspecified chronic kidney disease: Secondary | ICD-10-CM | POA: Diagnosis not present

## 2021-11-09 DIAGNOSIS — E663 Overweight: Secondary | ICD-10-CM | POA: Diagnosis not present

## 2021-11-09 DIAGNOSIS — I509 Heart failure, unspecified: Secondary | ICD-10-CM | POA: Diagnosis not present

## 2021-11-09 DIAGNOSIS — I639 Cerebral infarction, unspecified: Secondary | ICD-10-CM | POA: Diagnosis not present

## 2021-11-09 DIAGNOSIS — F32A Depression, unspecified: Secondary | ICD-10-CM | POA: Diagnosis not present

## 2021-11-09 DIAGNOSIS — D631 Anemia in chronic kidney disease: Secondary | ICD-10-CM | POA: Diagnosis not present

## 2021-11-09 DIAGNOSIS — I110 Hypertensive heart disease with heart failure: Secondary | ICD-10-CM | POA: Diagnosis not present

## 2021-11-09 DIAGNOSIS — N2581 Secondary hyperparathyroidism of renal origin: Secondary | ICD-10-CM | POA: Diagnosis not present

## 2021-11-12 DIAGNOSIS — I70248 Atherosclerosis of native arteries of left leg with ulceration of other part of lower left leg: Secondary | ICD-10-CM | POA: Diagnosis not present

## 2021-11-12 DIAGNOSIS — I110 Hypertensive heart disease with heart failure: Secondary | ICD-10-CM | POA: Diagnosis not present

## 2021-11-12 DIAGNOSIS — I5032 Chronic diastolic (congestive) heart failure: Secondary | ICD-10-CM | POA: Diagnosis not present

## 2021-11-12 DIAGNOSIS — N184 Chronic kidney disease, stage 4 (severe): Secondary | ICD-10-CM | POA: Diagnosis not present

## 2021-11-12 DIAGNOSIS — E1122 Type 2 diabetes mellitus with diabetic chronic kidney disease: Secondary | ICD-10-CM | POA: Diagnosis not present

## 2021-11-12 DIAGNOSIS — E1151 Type 2 diabetes mellitus with diabetic peripheral angiopathy without gangrene: Secondary | ICD-10-CM | POA: Diagnosis not present

## 2021-11-16 DIAGNOSIS — I110 Hypertensive heart disease with heart failure: Secondary | ICD-10-CM | POA: Diagnosis not present

## 2021-11-16 DIAGNOSIS — N184 Chronic kidney disease, stage 4 (severe): Secondary | ICD-10-CM | POA: Diagnosis not present

## 2021-11-16 DIAGNOSIS — E1122 Type 2 diabetes mellitus with diabetic chronic kidney disease: Secondary | ICD-10-CM | POA: Diagnosis not present

## 2021-11-16 DIAGNOSIS — I70248 Atherosclerosis of native arteries of left leg with ulceration of other part of lower left leg: Secondary | ICD-10-CM | POA: Diagnosis not present

## 2021-11-16 DIAGNOSIS — I5032 Chronic diastolic (congestive) heart failure: Secondary | ICD-10-CM | POA: Diagnosis not present

## 2021-11-16 DIAGNOSIS — E1151 Type 2 diabetes mellitus with diabetic peripheral angiopathy without gangrene: Secondary | ICD-10-CM | POA: Diagnosis not present

## 2021-11-17 DIAGNOSIS — M810 Age-related osteoporosis without current pathological fracture: Secondary | ICD-10-CM | POA: Diagnosis not present

## 2021-11-19 DIAGNOSIS — I70248 Atherosclerosis of native arteries of left leg with ulceration of other part of lower left leg: Secondary | ICD-10-CM | POA: Diagnosis not present

## 2021-11-19 DIAGNOSIS — N184 Chronic kidney disease, stage 4 (severe): Secondary | ICD-10-CM | POA: Diagnosis not present

## 2021-11-19 DIAGNOSIS — I5032 Chronic diastolic (congestive) heart failure: Secondary | ICD-10-CM | POA: Diagnosis not present

## 2021-11-19 DIAGNOSIS — I110 Hypertensive heart disease with heart failure: Secondary | ICD-10-CM | POA: Diagnosis not present

## 2021-11-19 DIAGNOSIS — E1151 Type 2 diabetes mellitus with diabetic peripheral angiopathy without gangrene: Secondary | ICD-10-CM | POA: Diagnosis not present

## 2021-11-19 DIAGNOSIS — E1122 Type 2 diabetes mellitus with diabetic chronic kidney disease: Secondary | ICD-10-CM | POA: Diagnosis not present

## 2021-11-22 DIAGNOSIS — L97828 Non-pressure chronic ulcer of other part of left lower leg with other specified severity: Secondary | ICD-10-CM | POA: Diagnosis not present

## 2021-11-22 DIAGNOSIS — Z7901 Long term (current) use of anticoagulants: Secondary | ICD-10-CM | POA: Diagnosis not present

## 2021-11-22 DIAGNOSIS — Z86718 Personal history of other venous thrombosis and embolism: Secondary | ICD-10-CM | POA: Diagnosis not present

## 2021-11-22 DIAGNOSIS — Z79891 Long term (current) use of opiate analgesic: Secondary | ICD-10-CM | POA: Diagnosis not present

## 2021-11-22 DIAGNOSIS — Z7902 Long term (current) use of antithrombotics/antiplatelets: Secondary | ICD-10-CM | POA: Diagnosis not present

## 2021-11-22 DIAGNOSIS — Z79899 Other long term (current) drug therapy: Secondary | ICD-10-CM | POA: Diagnosis not present

## 2021-11-22 DIAGNOSIS — Z95820 Peripheral vascular angioplasty status with implants and grafts: Secondary | ICD-10-CM | POA: Diagnosis not present

## 2021-11-22 DIAGNOSIS — E1122 Type 2 diabetes mellitus with diabetic chronic kidney disease: Secondary | ICD-10-CM | POA: Diagnosis not present

## 2021-11-22 DIAGNOSIS — I5032 Chronic diastolic (congestive) heart failure: Secondary | ICD-10-CM | POA: Diagnosis not present

## 2021-11-22 DIAGNOSIS — M48061 Spinal stenosis, lumbar region without neurogenic claudication: Secondary | ICD-10-CM | POA: Diagnosis not present

## 2021-11-22 DIAGNOSIS — I70201 Unspecified atherosclerosis of native arteries of extremities, right leg: Secondary | ICD-10-CM | POA: Diagnosis not present

## 2021-11-22 DIAGNOSIS — F419 Anxiety disorder, unspecified: Secondary | ICD-10-CM | POA: Diagnosis not present

## 2021-11-22 DIAGNOSIS — N184 Chronic kidney disease, stage 4 (severe): Secondary | ICD-10-CM | POA: Diagnosis not present

## 2021-11-22 DIAGNOSIS — J449 Chronic obstructive pulmonary disease, unspecified: Secondary | ICD-10-CM | POA: Diagnosis not present

## 2021-11-22 DIAGNOSIS — I480 Paroxysmal atrial fibrillation: Secondary | ICD-10-CM | POA: Diagnosis not present

## 2021-11-22 DIAGNOSIS — Z9181 History of falling: Secondary | ICD-10-CM | POA: Diagnosis not present

## 2021-11-22 DIAGNOSIS — Z6835 Body mass index (BMI) 35.0-35.9, adult: Secondary | ICD-10-CM | POA: Diagnosis not present

## 2021-11-22 DIAGNOSIS — Z87891 Personal history of nicotine dependence: Secondary | ICD-10-CM | POA: Diagnosis not present

## 2021-11-22 DIAGNOSIS — Z794 Long term (current) use of insulin: Secondary | ICD-10-CM | POA: Diagnosis not present

## 2021-11-22 DIAGNOSIS — Z8673 Personal history of transient ischemic attack (TIA), and cerebral infarction without residual deficits: Secondary | ICD-10-CM | POA: Diagnosis not present

## 2021-11-22 DIAGNOSIS — I70248 Atherosclerosis of native arteries of left leg with ulceration of other part of lower left leg: Secondary | ICD-10-CM | POA: Diagnosis not present

## 2021-11-22 DIAGNOSIS — Z7985 Long-term (current) use of injectable non-insulin antidiabetic drugs: Secondary | ICD-10-CM | POA: Diagnosis not present

## 2021-11-22 DIAGNOSIS — E1151 Type 2 diabetes mellitus with diabetic peripheral angiopathy without gangrene: Secondary | ICD-10-CM | POA: Diagnosis not present

## 2021-11-22 DIAGNOSIS — I110 Hypertensive heart disease with heart failure: Secondary | ICD-10-CM | POA: Diagnosis not present

## 2021-11-23 ENCOUNTER — Ambulatory Visit: Payer: Self-pay | Admitting: *Deleted

## 2021-11-23 ENCOUNTER — Ambulatory Visit
Admission: RE | Admit: 2021-11-23 | Discharge: 2021-11-23 | Disposition: A | Discharge status: Home / Self Care | Payer: Medicare Other | Source: Ambulatory Visit | Attending: Family Medicine | Admitting: Family Medicine

## 2021-11-23 ENCOUNTER — Ambulatory Visit
Admission: RE | Admit: 2021-11-23 | Discharge: 2021-11-23 | Disposition: A | Discharge status: Home / Self Care | Payer: Medicare Other | Attending: Family Medicine | Admitting: Family Medicine

## 2021-11-23 ENCOUNTER — Encounter: Payer: Self-pay | Admitting: Family Medicine

## 2021-11-23 ENCOUNTER — Ambulatory Visit (INDEPENDENT_AMBULATORY_CARE_PROVIDER_SITE_OTHER): Payer: Medicare Other | Admitting: Family Medicine

## 2021-11-23 VITALS — BP 120/75 | HR 79 | Resp 20 | Wt 234.0 lb

## 2021-11-23 DIAGNOSIS — I70248 Atherosclerosis of native arteries of left leg with ulceration of other part of lower left leg: Secondary | ICD-10-CM | POA: Diagnosis not present

## 2021-11-23 DIAGNOSIS — M79671 Pain in right foot: Secondary | ICD-10-CM | POA: Diagnosis not present

## 2021-11-23 DIAGNOSIS — N184 Chronic kidney disease, stage 4 (severe): Secondary | ICD-10-CM

## 2021-11-23 DIAGNOSIS — I5032 Chronic diastolic (congestive) heart failure: Secondary | ICD-10-CM | POA: Diagnosis not present

## 2021-11-23 DIAGNOSIS — R42 Dizziness and giddiness: Secondary | ICD-10-CM

## 2021-11-23 DIAGNOSIS — E1129 Type 2 diabetes mellitus with other diabetic kidney complication: Secondary | ICD-10-CM

## 2021-11-23 DIAGNOSIS — E1151 Type 2 diabetes mellitus with diabetic peripheral angiopathy without gangrene: Secondary | ICD-10-CM | POA: Diagnosis not present

## 2021-11-23 DIAGNOSIS — W19XXXA Unspecified fall, initial encounter: Secondary | ICD-10-CM | POA: Diagnosis not present

## 2021-11-23 DIAGNOSIS — I110 Hypertensive heart disease with heart failure: Secondary | ICD-10-CM | POA: Diagnosis not present

## 2021-11-23 DIAGNOSIS — Z23 Encounter for immunization: Secondary | ICD-10-CM | POA: Diagnosis not present

## 2021-11-23 DIAGNOSIS — E1122 Type 2 diabetes mellitus with diabetic chronic kidney disease: Secondary | ICD-10-CM | POA: Diagnosis not present

## 2021-11-23 MED ORDER — MECLIZINE HCL 12.5 MG PO TABS: 12.5000 mg | ORAL_TABLET | Freq: Three times a day (TID) | ORAL | 0 refills | Status: AC | PRN

## 2021-11-23 NOTE — Assessment & Plan Note (Signed)
Recent fall landing on R foot. Pain on palpation today, no marked swelling, redness, bruising. Will obtain imaging to assess for fracture given osteoporosis.

## 2021-11-23 NOTE — Assessment & Plan Note (Signed)
Unclear etiology though likely multifactorial. Neurological exam at baseline and symmetric, minimal concern for new stroke. Has h/o dizziness with lower CBGs, reported CBGs in adequate range. No nystagmus on exam though unable to tolerate Marye Round due to reproduction of symptoms. With room spinning and triggered by head and generalized movement, vertigo likely contributing. Also with significant deconditioning likely contributing. Orthostatics negative today but with lower normal BP, may be also contributing. Will obtain labs to assess glucose control, electrolytes, renal function. Will provide short course of meclizine. Has f/u with PCP next week. If BP remains low normal, consider antihypertensive dose reduction.

## 2021-11-23 NOTE — Progress Notes (Signed)
   SUBJECTIVE:   CHIEF COMPLAINT / HPI:   DIZZINESS - has happened with blood sugar low. - fell Saturday, Sunday. Rolled over in bed, fell out.  - able to bear weight - CBG 102-200s  Duration: 1 week Description of symptoms: ill-defined Dizziness frequency:  intermittent Provoking factors: movement, but also can come on without movement. Aggravating factors:  none Triggered by rolling over in bed: yes Triggered by bending over:  sometimes Aggravated by head movement: sometimes Aggravated by exertion, coughing, loud noises: no Recent head injury: no Recent or current viral symptoms: no Nausea: no Vomiting: no Tinnitus: no Headache: no Photophobia/phonophobia: sometimes, not new Unsteady gait: yes Diplopia, dysarthria, dysphagia or weakness: not new Related to exertion: no Pallor: no Diaphoresis: no Dyspnea: no Chest pain: no   OBJECTIVE:   BP 120/75 (BP Location: Left Arm, Patient Position: Sitting, Cuff Size: Large)   Pulse 79   Resp 20   Wt 234 lb (106.1 kg)   SpO2 95%   BMI 37.77 kg/m   Gen: well appearing, in NAD Card: RRR Lungs: CTAB Ext: WWP, no edema MSK: Full ROM, strength 3/5 to U/LE bilaterally, symmetric. No edema.  Neuro: Alert and oriented, speech normal.  Extraocular movements intact.  Intact symmetric sensation to light touch of face and extremities bilaterally.  Hearing grossly intact bilaterally.  Tongue protrudes normally with no deviation.  Shoulder shrug, smile symmetric. Unable to tolerate [employer].   ASSESSMENT/PLAN:   Fall Recent fall landing on R foot. Pain on palpation today, no marked swelling, redness, bruising. Will obtain imaging to assess for fracture given osteoporosis.  Dizziness Unclear etiology though likely multifactorial. Neurological exam at baseline and symmetric, minimal concern for new stroke. Has h/o dizziness with lower CBGs, reported CBGs in adequate range. No nystagmus on exam though unable to tolerate Marye Round due to reproduction of symptoms. With room spinning and triggered by head and generalized movement, vertigo likely contributing. Also with significant deconditioning likely contributing. Orthostatics negative today but with lower normal BP, may be also contributing. Will obtain labs to assess glucose control, electrolytes, renal function. Will provide short course of meclizine. Has f/u with PCP next week. If BP remains low normal, consider antihypertensive dose reduction.      Myles Gip, DO

## 2021-11-23 NOTE — Telephone Encounter (Signed)
  Chief Complaint: dizziness Symptoms: vertigo and lightheadedness Frequency: one week, fell twice Pertinent Negatives: Patient denies fever Disposition: '[]'$ ED /'[]'$ Urgent Care (no appt availability in office) / '[x]'$ Appointment(In office/virtual)/ '[]'$  [place] [facility]/ '[]'$ Home Care/ '[]'$ Refused Recommended Disposition /'[]'$ [place] [facility]/ '[]'$  Follow-up with PCP Additional Notes: Appt this afternoon, dizzy, fell twice, hurt ankle, home care discussed, she has a Therapist, sports that comes to do her wound care and she called to notify of  two falls.   Reason for Disposition  [1] MODERATE dizziness (e.g., vertigo; feels very unsteady, interferes with normal activities) AND [2] has NOT been evaluated by doctor (or NP/PA) for this  Answer Assessment - Initial Assessment Questions 1. DESCRIPTION: "Describe your dizziness."     Spinning and tilting room and light headed 2. VERTIGO: "Do you feel like either you or the room is spinning or tilting?"      yes 3. LIGHTHEADED: "Do you feel lightheaded?" (e.g., somewhat faint, woozy, weak upon standing)     yes 4. SEVERITY: "How bad is it?"  "Can you walk?"   - MILD: Feels slightly dizzy and unsteady, but is walking normally.   - MODERATE: Feels unsteady when walking, but not falling; interferes with normal activities (e.g., school, work).   - SEVERE: Unable to walk without falling, or requires assistance to walk without falling.     A little worse when first stands up 5. ONSET:  "When did the dizziness begin?"     A week 6. AGGRAVATING FACTORS: "Does anything make it worse?" (e.g., standing, change in head position)     Unknown  7. CAUSE: "What do you think is causing the dizziness?"     unknown 8. RECURRENT SYMPTOM: "Have you had dizziness before?" If Yes, ask: "When was the last time?" "What happened that time?"     yes 9. OTHER SYMPTOMS: "Do you have any other symptoms?" (e.g., headache, weakness, numbness, vomiting, earache)     Fell twice over weekend  hit wound on leg, the other fall twisted ankle and foot 10. PREGNANCY: "Is there any chance you are pregnant?" "When was your last menstrual period?"       no  Protocols used: Dizziness - Vertigo-A-AH

## 2021-11-25 ENCOUNTER — Other Ambulatory Visit: Payer: Self-pay | Admitting: *Deleted

## 2021-11-25 LAB — HEMOGLOBIN A1C
Est. average glucose Bld gHb Est-mCnc: 246 mg/dL
Hgb A1c MFr Bld: 10.2 % — ABNORMAL HIGH (ref 4.8–5.6)

## 2021-11-25 LAB — COMPREHENSIVE METABOLIC PANEL
ALT: 11 IU/L (ref 0–32)
AST: 18 IU/L (ref 0–40)
Albumin/Globulin Ratio: 1.4 (ref 1.2–2.2)
Albumin: 3.6 g/dL — ABNORMAL LOW (ref 3.9–4.9)
Alkaline Phosphatase: 104 IU/L (ref 44–121)
BUN/Creatinine Ratio: 17 (ref 12–28)
BUN: 56 mg/dL — ABNORMAL HIGH (ref 8–27)
Bilirubin Total: <0.2 mg/dL (ref 0.0–1.2)
CO2: 23 mmol/L (ref 20–29)
Calcium: 6.1 mg/dL — CL (ref 8.7–10.3)
Chloride: 102 mmol/L (ref 96–106)
Creatinine, Ser: 3.38 mg/dL — ABNORMAL HIGH (ref 0.57–1.00)
Globulin, Total: 2.5 g/dL (ref 1.5–4.5)
Glucose: 148 mg/dL — ABNORMAL HIGH (ref 70–99)
Potassium: 5.6 mmol/L — ABNORMAL HIGH (ref 3.5–5.2)
Sodium: 140 mmol/L (ref 134–144)
Total Protein: 6.1 g/dL (ref 6.0–8.5)
eGFR: 14 mL/min/{1.73_m2} — ABNORMAL LOW (ref >59)

## 2021-11-25 LAB — CBC WITH DIFFERENTIAL/PLATELET
Basophils Absolute: 0.1 10*3/uL (ref 0.0–0.2)
Basos: 1 %
EOS (ABSOLUTE): 0.6 10*3/uL — ABNORMAL HIGH (ref 0.0–0.4)
Eos: 4 %
Hematocrit: 27.5 % — ABNORMAL LOW (ref 34.0–46.6)
Hemoglobin: 8.2 g/dL — ABNORMAL LOW (ref 11.1–15.9)
Immature Grans (Abs): 0 10*3/uL (ref 0.0–0.1)
Immature Granulocytes: 0 %
Lymphocytes Absolute: 2.1 10*3/uL (ref 0.7–3.1)
Lymphs: 14 %
MCH: 25.1 pg — ABNORMAL LOW (ref 26.6–33.0)
MCHC: 29.8 g/dL — ABNORMAL LOW (ref 31.5–35.7)
MCV: 84 fL (ref 79–97)
Monocytes Absolute: 1 10*3/uL — ABNORMAL HIGH (ref 0.1–0.9)
Monocytes: 7 %
Neutrophils Absolute: 11.1 10*3/uL — ABNORMAL HIGH (ref 1.4–7.0)
Neutrophils: 74 %
Platelets: 464 10*3/uL — ABNORMAL HIGH (ref 150–450)
RBC: 3.27 x10E6/uL — ABNORMAL LOW (ref 3.77–5.28)
RDW: 15.7 % — ABNORMAL HIGH (ref 11.7–15.4)
WBC: 14.9 10*3/uL — ABNORMAL HIGH (ref 3.4–10.8)

## 2021-11-25 NOTE — Addendum Note (Signed)
Addended by: Myles Gip on: 11/25/2021 03:56 PM   Modules accepted: Orders

## 2021-11-25 NOTE — Addendum Note (Signed)
Addended by: Myles Gip on: 11/25/2021 09:54 AM   Modules accepted: Orders

## 2021-11-25 NOTE — Addendum Note (Signed)
Addended by: Julieta Bellini on: 11/25/2021 04:08 PM   Modules accepted: Orders

## 2021-11-26 DIAGNOSIS — I110 Hypertensive heart disease with heart failure: Secondary | ICD-10-CM | POA: Diagnosis not present

## 2021-11-26 DIAGNOSIS — E1122 Type 2 diabetes mellitus with diabetic chronic kidney disease: Secondary | ICD-10-CM | POA: Diagnosis not present

## 2021-11-26 DIAGNOSIS — N184 Chronic kidney disease, stage 4 (severe): Secondary | ICD-10-CM | POA: Diagnosis not present

## 2021-11-26 DIAGNOSIS — I70248 Atherosclerosis of native arteries of left leg with ulceration of other part of lower left leg: Secondary | ICD-10-CM | POA: Diagnosis not present

## 2021-11-26 DIAGNOSIS — E1151 Type 2 diabetes mellitus with diabetic peripheral angiopathy without gangrene: Secondary | ICD-10-CM | POA: Diagnosis not present

## 2021-11-26 DIAGNOSIS — I5032 Chronic diastolic (congestive) heart failure: Secondary | ICD-10-CM | POA: Diagnosis not present

## 2021-11-27 ENCOUNTER — Encounter: Payer: Self-pay | Admitting: Pulmonary Disease

## 2021-11-27 NOTE — Progress Notes (Signed)
Established patient visit   Patient: Jocelyn Wells   DOB: 1955-12-31   66 y.o. Female  MRN: [id number] Visit Date: 12/04/2021  Today's healthcare provider: Gwyneth Sprout, FNP  Re Introduced to nurse practitioner role and practice setting.  All questions answered.  Discussed provider/patient relationship and expectations.  I,[name] J Bragg,acting as a scribe for Gwyneth Sprout, FNP.,have documented all relevant documentation on the behalf of Gwyneth Sprout, FNP,as directed by  Gwyneth Sprout, FNP while in the presence of Gwyneth Sprout, FNP.   Chief Complaint  Patient presents with   Diabetes   Subjective    HPI  Diabetes Mellitus Type II, Follow-up  Lab Results  Component Value Date   HGBA1C 10.2 (H) 11/23/2021   HGBA1C 7.9 (A) 07/14/2021   HGBA1C 10.3 09/02/2020   Wt Readings from Last 3 Encounters:  12/04/21 245 lb (111.1 kg)  11/23/21 234 lb (106.1 kg)  11/03/21 223 lb (101.2 kg)   Last seen for diabetes 3 months ago.  Management since then includes continue medication. She reports excellent compliance with treatment. She is having side effects. dizziness Symptoms: Yes fatigue No foot ulcerations  Yes appetite changes No nausea  Yes paresthesia of the feet  Yes polydipsia  Yes polyuria Yes visual disturbances   No vomiting     Home blood sugar records: fasting range: around 170's  Episodes of hypoglycemia? Yes feels shaky   Current insulin regiment: insulin pump Most Recent Eye Exam: appt is next month Current exercise: none Current diet habits: low salt  Pertinent Labs: Lab Results  Component Value Date   CHOL 271 (H) 10/20/2020   HDL 41 10/20/2020   LDLCALC 186 (H) 10/20/2020   TRIG 231 (H) 10/20/2020   CHOLHDL 6.6 (H) 10/20/2020   Lab Results  Component Value Date   NA 140 11/23/2021   K 5.6 (H) 11/23/2021   CREATININE 3.38 (H) 11/23/2021   EGFR 14 (L) 11/23/2021   MICROALBUR 34 05/19/2016      ---------------------------------------------------------------------------------------------------   Medications: Outpatient Medications Prior to Visit  Medication Sig   albuterol (VENTOLIN HFA) 108 (90 Base) MCG/ACT inhaler TAKE 2 PUFFS BY MOUTH EVERY 6 HOURS AS NEEDED FOR WHEEZE OR SHORTNESS OF BREATH   apixaban (ELIQUIS) 5 MG TABS tablet Take 1 tablet (5 mg total) by mouth 2 (two) times daily.   atorvastatin (LIPITOR) 40 MG tablet Take 1 tablet (40 mg total) by mouth daily.   carvedilol (COREG) 12.5 MG tablet Take 1 tablet (12.5 mg total) by mouth 2 (two) times daily with a meal.   clopidogrel (PLAVIX) 75 MG tablet Take 1 tablet (75 mg total) by mouth daily.   Dulaglutide 3 MG/0.5ML SOPN Inject into the skin once a week.   fluticasone (FLONASE) 50 MCG/ACT nasal spray Place 2 sprays into both nostrils daily.   gabapentin (NEURONTIN) 300 MG capsule Take 300 mg by mouth 2 (two) times daily.   glucose blood test strip OneTouch Verio strips   insulin aspart (NOVOLOG) 100 UNIT/ML injection Insulin pump   Insulin Disposable Pump (OMNIPOD DASH PODS, GEN 4,) MISC USE 1 POD EACH EVERY 72    HOURS   Insulin Human (INSULIN PUMP) SOLN Per endocrinoloy   loratadine (CLARITIN) 10 MG tablet Take 1 tablet by mouth daily.   losartan (COZAAR) 50 MG tablet Take 1 tablet (50 mg total) by mouth daily.   meclizine (ANTIVERT) 12.5 MG tablet Take 1 tablet (12.5 mg total) by mouth 3 (  three) times daily as needed for dizziness.   pantoprazole (PROTONIX) 40 MG tablet Take 1 tablet (40 mg total) by mouth 2 (two) times daily.   PARoxetine (PAXIL) 40 MG tablet Take 1 tablet (40 mg total) by mouth daily.   silver sulfADIAZINE (SILVADENE) 1 % cream Apply 1 Application topically daily.   Spacer/Aero-Holding [facility] DIAMOND) MISC Use with inhaler to ensure medication is delivered throughout lungs   torsemide (DEMADEX) 20 MG tablet Take 20 mg by mouth daily.   traMADol (ULTRAM) 50 MG tablet Take 1 tablet  (50 mg total) by mouth every 12 (twelve) hours as needed.   No facility-administered medications prior to visit.    Review of Systems   Objective    BP 134/63 (BP Location: Right Arm, Patient Position: Sitting, Cuff Size: Large)   Pulse 78   Resp 16   Ht _0  (1.651 m)   Wt 245 lb (111.1 kg)   SpO2 92%   BMI 40.77 kg/m   Physical Exam Vitals and nursing note reviewed.  Constitutional:      General: She is not in acute distress.    Appearance: Normal appearance. She is obese. She is not ill-appearing, toxic-appearing or diaphoretic.  HENT:     Head: Normocephalic and atraumatic.  Cardiovascular:     Rate and Rhythm: Normal rate and regular rhythm.     Pulses: Normal pulses.     Heart sounds: Normal heart sounds. No murmur heard.    No friction rub. No gallop.     Comments: Slow healing wound to LLE  Pulmonary:     Effort: Pulmonary effort is normal. Tachypnea present. No respiratory distress.     Breath sounds: No stridor. Examination of the right-upper field reveals wheezing. Wheezing present. No rhonchi or rales.     Comments: RUL wheezing cleared with forced cough  Chest:     Chest wall: No tenderness.  Abdominal:     General: Bowel sounds are normal.     Palpations: Abdomen is soft.  Musculoskeletal:        General: No swelling, tenderness, deformity or signs of injury. Normal range of motion.     Right lower leg: 2+ Pitting Edema present.     Left lower leg: 2+ Pitting Edema present.  Skin:    General: Skin is warm and dry.     Capillary Refill: Capillary refill takes less than 2 seconds.     Coloration: Skin is not jaundiced or pale.     Findings: No bruising, erythema, lesion or rash.  Neurological:     General: No focal deficit present.     Mental Status: She is alert and oriented to person, place, and time. Mental status is at baseline.     Cranial Nerves: No cranial nerve deficit.     Sensory: No sensory deficit.     Motor: No weakness.      Coordination: Coordination normal.  Psychiatric:        Mood and Affect: Mood normal.        Behavior: Behavior normal.        Thought Content: Thought content normal.        Judgment: Judgment normal.      No results found for any visits on 12/04/21.  Assessment & Plan     Problem List Items Addressed This Visit       Cardiovascular and Mediastinum   Heart failure, unspecified ([place])    Chronic, worsening Weight up 20# in 1  month Pt with borderline SPO2 at 92% Has restarted smoking Referral back to HF clinic for fluid management       Relevant Orders   AMB referral to CHF clinic   Hypertension associated with diabetes ([place])    Chronic, borderline However, recent complaints of dizziness and recent fall Goal <130/<80 Coreg 12.5, Losartan 50, torsemide 20       Relevant Orders   Comprehensive Metabolic Panel (CMET)   CBC with Differential/Platelet     Endocrine   Hyperlipidemia associated with type 2 diabetes mellitus (HCC)    Chronic, hx of atherosclerosis as well Not on statin Unable to perform regular exercise d/t COPD/CHF      Relevant Orders   Lipid panel   Type 2 diabetes mellitus with other diabetic kidney complication (HCC)    Chronic, uncontrolled Unclear if hypoglycemia is the cause of dizziness given no records today from insulin pump/CGM Has upcoming appt with endocrine for f/u Non healing wound to L leg remains Unclear of dosage with current pump      Relevant Orders   Ambulatory referral to Podiatry     Genitourinary   CKD (chronic kidney disease), stage IV ([place])    Followed by nephro; has started smoking again Repeat CMP      Relevant Orders   Comprehensive Metabolic Panel (CMET)     Other   RESOLVED: Compulsive tobacco user syndrome   Depression, recurrent (HCC)    Chronic, worsening Addition of wellbutrin to assist hx of paxil 40       Relevant Medications   buPROPion (WELLBUTRIN SR) 150 MG 12 hr tablet   Leukocytosis     Chronic x2 years; refer to hem onc for further differentiation       Relevant Orders   Ambulatory referral to Hematology / Oncology   Long toenail    Pt request to be seen by podiatry to assist with DM nail trimming       Relevant Orders   Ambulatory referral to Podiatry   Personal history of nicotine dependence    Due for low dose CT for best lung prevention given chronic nicotine use      Relevant Orders   CT CHEST LUNG CA SCREEN LOW DOSE W/O CM   Screening for colon cancer - Primary    Due for repeat colonoscopy for 5 year results       Relevant Orders   Ambulatory referral to Gastroenterology     Return in about 6 weeks (around 01/15/2022) for chonic disease management.      Vonna Kotyk, FNP, have reviewed all documentation for this visit. The documentation on 12/04/21 for the exam, diagnosis, procedures, and orders are all accurate and complete.    Gwyneth Sprout, [place] [phone] (phone) [phone] (fax)  [place]

## 2021-11-30 DIAGNOSIS — N184 Chronic kidney disease, stage 4 (severe): Secondary | ICD-10-CM | POA: Diagnosis not present

## 2021-11-30 DIAGNOSIS — E1122 Type 2 diabetes mellitus with diabetic chronic kidney disease: Secondary | ICD-10-CM | POA: Diagnosis not present

## 2021-11-30 DIAGNOSIS — E1151 Type 2 diabetes mellitus with diabetic peripheral angiopathy without gangrene: Secondary | ICD-10-CM | POA: Diagnosis not present

## 2021-11-30 DIAGNOSIS — I110 Hypertensive heart disease with heart failure: Secondary | ICD-10-CM | POA: Diagnosis not present

## 2021-11-30 DIAGNOSIS — I5032 Chronic diastolic (congestive) heart failure: Secondary | ICD-10-CM | POA: Diagnosis not present

## 2021-11-30 DIAGNOSIS — I70248 Atherosclerosis of native arteries of left leg with ulceration of other part of lower left leg: Secondary | ICD-10-CM | POA: Diagnosis not present

## 2021-12-01 ENCOUNTER — Other Ambulatory Visit: Payer: Self-pay | Admitting: *Deleted

## 2021-12-01 MED ORDER — CARVEDILOL 12.5 MG PO TABS: 12.5000 mg | ORAL_TABLET | Freq: Two times a day (BID) | ORAL | 0 refills | Status: DC

## 2021-12-01 MED ORDER — LOSARTAN POTASSIUM 50 MG PO TABS: 50.0000 mg | ORAL_TABLET | Freq: Every day | ORAL | 0 refills | Status: DC

## 2021-12-03 DIAGNOSIS — I110 Hypertensive heart disease with heart failure: Secondary | ICD-10-CM | POA: Diagnosis not present

## 2021-12-03 DIAGNOSIS — N184 Chronic kidney disease, stage 4 (severe): Secondary | ICD-10-CM | POA: Diagnosis not present

## 2021-12-03 DIAGNOSIS — I5032 Chronic diastolic (congestive) heart failure: Secondary | ICD-10-CM | POA: Diagnosis not present

## 2021-12-03 DIAGNOSIS — E1122 Type 2 diabetes mellitus with diabetic chronic kidney disease: Secondary | ICD-10-CM | POA: Diagnosis not present

## 2021-12-03 DIAGNOSIS — E1151 Type 2 diabetes mellitus with diabetic peripheral angiopathy without gangrene: Secondary | ICD-10-CM | POA: Diagnosis not present

## 2021-12-03 DIAGNOSIS — I70248 Atherosclerosis of native arteries of left leg with ulceration of other part of lower left leg: Secondary | ICD-10-CM | POA: Diagnosis not present

## 2021-12-04 ENCOUNTER — Encounter: Payer: Self-pay | Admitting: Family Medicine

## 2021-12-04 ENCOUNTER — Ambulatory Visit (INDEPENDENT_AMBULATORY_CARE_PROVIDER_SITE_OTHER): Payer: Medicare Other | Admitting: Family Medicine

## 2021-12-04 VITALS — BP 134/63 | HR 78 | Resp 16 | Ht 65.0 in | Wt 245.0 lb

## 2021-12-04 DIAGNOSIS — F17200 Nicotine dependence, unspecified, uncomplicated: Secondary | ICD-10-CM

## 2021-12-04 DIAGNOSIS — N184 Chronic kidney disease, stage 4 (severe): Secondary | ICD-10-CM | POA: Diagnosis not present

## 2021-12-04 DIAGNOSIS — Z1211 Encounter for screening for malignant neoplasm of colon: Secondary | ICD-10-CM | POA: Diagnosis not present

## 2021-12-04 DIAGNOSIS — D72829 Elevated white blood cell count, unspecified: Secondary | ICD-10-CM

## 2021-12-04 DIAGNOSIS — Z87891 Personal history of nicotine dependence: Secondary | ICD-10-CM

## 2021-12-04 DIAGNOSIS — I152 Hypertension secondary to endocrine disorders: Secondary | ICD-10-CM

## 2021-12-04 DIAGNOSIS — L602 Onychogryphosis: Secondary | ICD-10-CM

## 2021-12-04 DIAGNOSIS — E1169 Type 2 diabetes mellitus with other specified complication: Secondary | ICD-10-CM | POA: Diagnosis not present

## 2021-12-04 DIAGNOSIS — E1129 Type 2 diabetes mellitus with other diabetic kidney complication: Secondary | ICD-10-CM

## 2021-12-04 DIAGNOSIS — I5022 Chronic systolic (congestive) heart failure: Secondary | ICD-10-CM | POA: Diagnosis not present

## 2021-12-04 DIAGNOSIS — F339 Major depressive disorder, recurrent, unspecified: Secondary | ICD-10-CM | POA: Diagnosis not present

## 2021-12-04 DIAGNOSIS — E785 Hyperlipidemia, unspecified: Secondary | ICD-10-CM

## 2021-12-04 DIAGNOSIS — E1159 Type 2 diabetes mellitus with other circulatory complications: Secondary | ICD-10-CM | POA: Diagnosis not present

## 2021-12-04 MED ORDER — BUPROPION HCL ER (SR) 150 MG PO TB12
150.0000 mg | ORAL_TABLET | Freq: Two times a day (BID) | ORAL | 1 refills | Status: DC
Start: 2021-12-04 — End: 2022-01-18

## 2021-12-04 NOTE — Assessment & Plan Note (Signed)
Chronic, hx of atherosclerosis as well Not on statin Unable to perform regular exercise d/t COPD/CHF

## 2021-12-04 NOTE — Assessment & Plan Note (Signed)
Chronic, worsening Weight up 20# in 1 month Pt with borderline SPO2 at 92% Has restarted smoking Referral back to HF clinic for fluid management

## 2021-12-04 NOTE — Assessment & Plan Note (Signed)
Due for repeat colonoscopy for 5 year results

## 2021-12-04 NOTE — Assessment & Plan Note (Signed)
Pt request to be seen by podiatry to assist with DM nail trimming

## 2021-12-04 NOTE — Assessment & Plan Note (Signed)
Chronic, uncontrolled Unclear if hypoglycemia is the cause of dizziness given no records today from insulin pump/CGM Has upcoming appt with endocrine for f/u Non healing wound to L leg remains Unclear of dosage with current pump

## 2021-12-04 NOTE — Assessment & Plan Note (Signed)
Chronic, worsening Addition of wellbutrin to assist hx of paxil 40

## 2021-12-04 NOTE — Assessment & Plan Note (Signed)
Due for low dose CT for best lung prevention given chronic nicotine use

## 2021-12-04 NOTE — Assessment & Plan Note (Signed)
Chronic, borderline However, recent complaints of dizziness and recent fall Goal <130/<80 Coreg 12.5, Losartan 50, torsemide 20

## 2021-12-04 NOTE — Assessment & Plan Note (Signed)
Chronic x2 years; refer to hem onc for further differentiation

## 2021-12-04 NOTE — Patient Instructions (Signed)
Please call and schedule your mammogram:  [facility]  [address], [address] [facility] [place],  [place]  [address] Get Driving Directions Main: [phone]  Sunday:Closed Monday:7:20 AM - 5:00 PM Tuesday:7:20 AM - 5:00 PM Wednesday:7:20 AM - 5:00 PM Thursday:7:20 AM - 5:00 PM Friday:7:20 AM - 4:30 PM Saturday:Closed  

## 2021-12-04 NOTE — Assessment & Plan Note (Signed)
Followed by nephro; has started smoking again Repeat CMP

## 2021-12-06 ENCOUNTER — Telehealth: Payer: Self-pay | Admitting: Family Medicine

## 2021-12-06 NOTE — Telephone Encounter (Signed)
Labs reviewed; recommend patient seek emergency care. VM left for both patient and spouse.

## 2021-12-06 NOTE — Progress Notes (Signed)
Calcium and Potassium are concerning; following consult with another provider I have called and left VM to [facility] and to spouse, Annie Main, to seek emergent care at this time.

## 2021-12-06 NOTE — Progress Notes (Signed)
I need assistance on management of this patient for hyperkalemia in setting of CKD and CHF. My recommendation is for patient to seek emergent care to prevent end organ damage.

## 2021-12-07 ENCOUNTER — Encounter (INDEPENDENT_AMBULATORY_CARE_PROVIDER_SITE_OTHER): Payer: Self-pay

## 2021-12-07 ENCOUNTER — Other Ambulatory Visit: Payer: Self-pay

## 2021-12-07 ENCOUNTER — Emergency Department
Admission: EM | Admit: 2021-12-07 | Discharge: 2021-12-07 | Discharge status: Against Medical Advice | Payer: Medicare Other | Attending: Emergency Medicine | Admitting: Emergency Medicine

## 2021-12-07 DIAGNOSIS — N184 Chronic kidney disease, stage 4 (severe): Secondary | ICD-10-CM | POA: Diagnosis not present

## 2021-12-07 DIAGNOSIS — R799 Abnormal finding of blood chemistry, unspecified: Secondary | ICD-10-CM | POA: Insufficient documentation

## 2021-12-07 DIAGNOSIS — R42 Dizziness and giddiness: Secondary | ICD-10-CM | POA: Insufficient documentation

## 2021-12-07 DIAGNOSIS — H538 Other visual disturbances: Secondary | ICD-10-CM | POA: Insufficient documentation

## 2021-12-07 DIAGNOSIS — I5032 Chronic diastolic (congestive) heart failure: Secondary | ICD-10-CM | POA: Diagnosis not present

## 2021-12-07 DIAGNOSIS — I110 Hypertensive heart disease with heart failure: Secondary | ICD-10-CM | POA: Diagnosis not present

## 2021-12-07 DIAGNOSIS — Z5321 Procedure and treatment not carried out due to patient leaving prior to being seen by health care provider: Secondary | ICD-10-CM | POA: Diagnosis not present

## 2021-12-07 DIAGNOSIS — E1122 Type 2 diabetes mellitus with diabetic chronic kidney disease: Secondary | ICD-10-CM | POA: Diagnosis not present

## 2021-12-07 DIAGNOSIS — I70248 Atherosclerosis of native arteries of left leg with ulceration of other part of lower left leg: Secondary | ICD-10-CM | POA: Diagnosis not present

## 2021-12-07 DIAGNOSIS — E1151 Type 2 diabetes mellitus with diabetic peripheral angiopathy without gangrene: Secondary | ICD-10-CM | POA: Diagnosis not present

## 2021-12-07 LAB — LIPID PANEL
Chol/HDL Ratio: 3 ratio (ref 0.0–4.4)
Cholesterol, Total: 107 mg/dL (ref 100–199)
HDL: 36 mg/dL — ABNORMAL LOW (ref >39)
LDL Chol Calc (NIH): 50 mg/dL (ref 0–99)
Triglycerides: 112 mg/dL (ref 0–149)
VLDL Cholesterol Cal: 21 mg/dL (ref 5–40)

## 2021-12-07 LAB — CBC WITH DIFFERENTIAL/PLATELET
Abs Immature Granulocytes: 0.06 10*3/uL (ref 0.00–0.07)
Basophils Absolute: 0.1 10*3/uL (ref 0.0–0.2)
Basophils Absolute: 0.1 10*3/uL (ref 0.0–0.1)
Basophils Relative: 1 %
Basos: 1 %
EOS (ABSOLUTE): 0.5 10*3/uL — ABNORMAL HIGH (ref 0.0–0.4)
Eos: 4 %
Eosinophils Absolute: 0.6 10*3/uL — ABNORMAL HIGH (ref 0.0–0.5)
Eosinophils Relative: 4 %
HCT: 24.6 % — ABNORMAL LOW (ref 36.0–46.0)
Hematocrit: 24.7 % — ABNORMAL LOW (ref 34.0–46.6)
Hemoglobin: 7.2 g/dL — ABNORMAL LOW (ref 11.1–15.9)
Hemoglobin: 7.2 g/dL — ABNORMAL LOW (ref 12.0–15.0)
Immature Grans (Abs): 0 10*3/uL (ref 0.0–0.1)
Immature Granulocytes: 0 %
Immature Granulocytes: 0 %
Lymphocytes Absolute: 1.6 10*3/uL (ref 0.7–3.1)
Lymphocytes Relative: 14 %
Lymphs Abs: 2 10*3/uL (ref 0.7–4.0)
Lymphs: 13 %
MCH: 24.1 pg — ABNORMAL LOW (ref 26.6–33.0)
MCH: 23.7 pg — ABNORMAL LOW (ref 26.0–34.0)
MCHC: 29.1 g/dL — ABNORMAL LOW (ref 31.5–35.7)
MCHC: 29.3 g/dL — ABNORMAL LOW (ref 30.0–36.0)
MCV: 83 fL (ref 79–97)
MCV: 80.9 fL (ref 80.0–100.0)
Monocytes Absolute: 0.9 10*3/uL (ref 0.1–0.9)
Monocytes Absolute: 1.1 10*3/uL — ABNORMAL HIGH (ref 0.1–1.0)
Monocytes Relative: 8 %
Monocytes: 8 %
Neutro Abs: 10.1 10*3/uL — ABNORMAL HIGH (ref 1.7–7.7)
Neutrophils Absolute: 8.7 10*3/uL — ABNORMAL HIGH (ref 1.4–7.0)
Neutrophils Relative %: 73 %
Neutrophils: 74 %
Platelets: 484 10*3/uL — ABNORMAL HIGH (ref 150–450)
Platelets: 535 10*3/uL — ABNORMAL HIGH (ref 150–400)
RBC: 2.99 x10E6/uL — ABNORMAL LOW (ref 3.77–5.28)
RBC: 3.04 MIL/uL — ABNORMAL LOW (ref 3.87–5.11)
RDW: 15.4 % (ref 11.7–15.4)
RDW: 17.5 % — ABNORMAL HIGH (ref 11.5–15.5)
WBC: 11.8 10*3/uL — ABNORMAL HIGH (ref 3.4–10.8)
WBC: 13.9 10*3/uL — ABNORMAL HIGH (ref 4.0–10.5)
nRBC: 0 % (ref 0.0–0.2)

## 2021-12-07 LAB — COMPREHENSIVE METABOLIC PANEL
ALT: 11 IU/L (ref 0–32)
ALT: 10 U/L (ref 0–44)
AST: 13 IU/L (ref 0–40)
AST: 12 U/L — ABNORMAL LOW (ref 15–41)
Albumin/Globulin Ratio: 1.5 (ref 1.2–2.2)
Albumin: 3.6 g/dL — ABNORMAL LOW (ref 3.9–4.9)
Albumin: 3.2 g/dL — ABNORMAL LOW (ref 3.5–5.0)
Alkaline Phosphatase: 106 IU/L (ref 44–121)
Alkaline Phosphatase: 92 U/L (ref 38–126)
Anion gap: 7 (ref 5–15)
BUN/Creatinine Ratio: 15 (ref 12–28)
BUN: 38 mg/dL — ABNORMAL HIGH (ref 8–27)
BUN: 41 mg/dL — ABNORMAL HIGH (ref 8–23)
Bilirubin Total: <0.2 mg/dL (ref 0.0–1.2)
CO2: 18 mmol/L — ABNORMAL LOW (ref 20–29)
CO2: 21 mmol/L — ABNORMAL LOW (ref 22–32)
Calcium: 6 mg/dL — CL (ref 8.7–10.3)
Calcium: 6.6 mg/dL — ABNORMAL LOW (ref 8.9–10.3)
Chloride: 107 mmol/L — ABNORMAL HIGH (ref 96–106)
Chloride: 111 mmol/L (ref 98–111)
Creatinine, Ser: 2.51 mg/dL — ABNORMAL HIGH (ref 0.57–1.00)
Creatinine, Ser: 2.25 mg/dL — ABNORMAL HIGH (ref 0.44–1.00)
GFR, Estimated: 23 mL/min — ABNORMAL LOW (ref >60)
Globulin, Total: 2.4 g/dL (ref 1.5–4.5)
Glucose, Bld: 136 mg/dL — ABNORMAL HIGH (ref 70–99)
Glucose: 327 mg/dL — ABNORMAL HIGH (ref 70–99)
Potassium: 6.3 mmol/L — ABNORMAL HIGH (ref 3.5–5.2)
Potassium: 5.1 mmol/L (ref 3.5–5.1)
Sodium: 140 mmol/L (ref 134–144)
Sodium: 139 mmol/L (ref 135–145)
Total Bilirubin: 0.5 mg/dL (ref 0.3–1.2)
Total Protein: 6 g/dL (ref 6.0–8.5)
Total Protein: 6.8 g/dL (ref 6.5–8.1)
eGFR: 20 mL/min/{1.73_m2} — ABNORMAL LOW (ref >59)

## 2021-12-07 NOTE — Telephone Encounter (Addendum)
Pt's husband called back asking did pt "really need to go to ED, it's after 4 o'clock, are they really going to do anything?" Advised pt's husband, that yes she needs to go  to ED. Her potassium level was high 3 days ago and could potentially be higher now. Advised husband a high potassium could cause cardiac arrthymia and heart attack. Pt advised to take her to nearest ED now. Called [facility] and advised [name] PA-C of spouse's reluctance to take pt to ED.

## 2021-12-07 NOTE — Telephone Encounter (Signed)
This encounter was created in error - please disregard.

## 2021-12-07 NOTE — Telephone Encounter (Signed)
Pt stated she is unable to review her lab work in [facility]. Advised pt what her K level is and the normal range. [facility]) to advise pt to go to ED now.

## 2021-12-07 NOTE — ED Triage Notes (Signed)
Pt arrives due to abnormal labs. Per pt, her doctors office called and said she had an elevated potassium and calcium level. Pt endorses dizziness and blurry vision that started 2 weeks ago.

## 2021-12-08 ENCOUNTER — Telehealth: Payer: Self-pay

## 2021-12-08 NOTE — Telephone Encounter (Signed)
Copied from [place] [phone]. Topic: General - Other >> Dec 08, 2021 11:30 AM Everette C wrote: Reason for CRM: The patient's husband has called to request contact with a member of clinical staff when possible regarding the patient's recent labs   Please contact further when possible

## 2021-12-09 ENCOUNTER — Ambulatory Visit (INDEPENDENT_AMBULATORY_CARE_PROVIDER_SITE_OTHER): Payer: Medicare Other | Admitting: Pulmonary Disease

## 2021-12-09 ENCOUNTER — Encounter: Payer: Self-pay | Admitting: Pulmonary Disease

## 2021-12-09 ENCOUNTER — Telehealth: Payer: Self-pay

## 2021-12-09 VITALS — BP 164/84 | HR 76 | Temp 97.0°F | Ht 65.0 in | Wt 243.6 lb

## 2021-12-09 DIAGNOSIS — D631 Anemia in chronic kidney disease: Secondary | ICD-10-CM | POA: Diagnosis not present

## 2021-12-09 DIAGNOSIS — J449 Chronic obstructive pulmonary disease, unspecified: Secondary | ICD-10-CM

## 2021-12-09 DIAGNOSIS — E669 Obesity, unspecified: Secondary | ICD-10-CM | POA: Diagnosis not present

## 2021-12-09 DIAGNOSIS — I5032 Chronic diastolic (congestive) heart failure: Secondary | ICD-10-CM

## 2021-12-09 DIAGNOSIS — G4736 Sleep related hypoventilation in conditions classified elsewhere: Secondary | ICD-10-CM

## 2021-12-09 DIAGNOSIS — R0602 Shortness of breath: Secondary | ICD-10-CM | POA: Diagnosis not present

## 2021-12-09 DIAGNOSIS — N184 Chronic kidney disease, stage 4 (severe): Secondary | ICD-10-CM | POA: Diagnosis not present

## 2021-12-09 NOTE — Progress Notes (Signed)
Subjective:    Patient ID: Jocelyn Wells, female    DOB: Sep 22, 1955, 66 y.o.   MRN: [id number] Patient Care Team: Gwyneth Sprout, FNP as PCP - General (Family Medicine) Gabriel Carina Betsey Holiday, MD as Physician Assistant (Endocrinology) Deboraha Sprang, MD as Consulting Physician (Cardiology) Rainey Pines, MD as Referring Physician (Psychiatry) Ralene Bathe, MD as Consulting Physician (Dermatology) Dimmig, Marcello Moores, MD as Referring Physician (Orthopedic Surgery) Schnier, Dolores Lory, MD (Vascular Surgery) Lynnell Dike, OD (Optometry) Germaine Pomfret, [facility] (Pharmacist) Tyler Pita, MD as Consulting Physician (Pulmonary Disease)  Chief Complaint  Patient presents with   Follow-up    COPD. SOB with exertion. Wheezing. ER on 12/07/2021.    HPI Jocelyn Wells is a 66 year old smoker (resumed habit, 37 PY) who presents for follow-up on the issue of dyspnea on exertion, presumed COPD and obesity with obesity hypoventilation.  She was initially evaluated on 06 October 2021.  For the details of that consultation please refer to that note.  With regards to dyspnea on exertion she refers to this mostly as fatigue.  She was in the emergency room on 30 October after it was noted that she had a high potassium level on her outpatient laboratory data.  The patient went to the ED however did not stay for formal evaluation and management.  She inquires as to the results of those tests today.  She was supposed to have her PFTs yesterday however she canceled and rescheduled because she did not feel well.  She stated she was nauseated and felt "poorly".  Inexplicably she has started smoking again but limiting herself to 3 cigarettes/day.  She had an overnight oximetry performed on 19 September which showed that she had desaturations to as low as 74%.  Oxygen was ordered for the patient however [employer] has never contacted the patient for set up.  Previously she had had a split-night sleep study in March 2017 that did not  show central nor obstructive sleep apnea however the patient did have significant oxygen desaturations likely related to obesity with alveolar hypoventilation.  Today the patient presents with no significant respiratory complaint.  She does not endorse any cough or sputum production.  No hemoptysis.  No chest pain.  Overall she just feels "poorly" and gets fatigued easily.  No chest pain, no calf tenderness.  She does have chronic lower extremity edema.  She does not endorse any other symptomatology.  Use of as needed albuterol this perhaps once to twice a week.    Review of Systems A 10 point review of systems was performed and it is as noted above otherwise negative.  Patient Active Problem List   Diagnosis Date Noted   Personal history of nicotine dependence 12/04/2021   Leukocytosis 12/04/2021   Long toenail 12/04/2021   Screening for colon cancer 12/04/2021   Depression, recurrent ([place]) 12/04/2021   Hospital discharge follow-up 09/02/2021   Hyperkalemia 08/15/2021   Acute on chronic diastolic CHF (congestive heart failure) ([place]) 08/14/2021   Atherosclerosis of native arteries of the extremities with ulceration ([place]) 08/12/2021   Positive depression screening 07/14/2021   DOE (dyspnea on exertion) 07/14/2021   Bilateral leg edema 07/14/2021   Fall 04/13/2021   Anemia in chronic kidney disease 03/18/2021   Heart failure, unspecified ([place]) 03/18/2021   Hyperparathyroidism due to renal insufficiency ([place]) 03/18/2021   Chronic depression 03/18/2021   Overweight 03/18/2021   Poor appetite 02/13/2021   MDD (major depressive disorder), recurrent episode, mild ([place]) 02/13/2021  Benign hypertensive kidney disease with chronic kidney disease 10/14/2020   CKD (chronic kidney disease), stage IV ([place]) 05/31/2020   Compression fracture of L1 lumbar vertebra ([place]) 01/30/2019   Compression fracture of body of thoracic vertebra (HCC) 01/30/2019   Lumbar foraminal stenosis (RIGHT L1)  01/30/2019   Spinal stenosis of lumbar region with neurogenic claudication (L4-L5) 01/30/2019   Osteoporosis, post-menopausal 04/14/2018   Vitamin D deficiency 04/14/2018   Chronic deep vein thrombosis (DVT) ([place]) 02/09/2018   Post-menopausal 11/17/2017   Aortic atherosclerosis ([place]) 11/08/2017   Snoring 04/24/2015   Sleep paralysis, recurrent isolated 04/24/2015   Hypersomnia with sleep apnea 04/24/2015   Cataplexy 04/24/2015   Morbid obesity due to excess calories ([place]) 04/24/2015   COPD (chronic obstructive pulmonary disease) ([place]) 84/16/6063   Embolic stroke ([place]) 01/60/1093   Acute renal failure superimposed on stage 4 chronic kidney disease ([place]) 01/29/2015   Dizziness 07/05/2014   Hypertension associated with diabetes ([place]) 07/05/2014   Carbuncle and furuncle 07/05/2014   Pain of perianal area 07/05/2014   Guttate psoriasis 07/05/2014   H/O: osteoarthritis 06/05/2014   Anxiety, generalized 06/05/2014   Microalbuminuria 12/23/2013   Long term current use of insulin ([place]) 12/23/2013   Type 2 diabetes mellitus with other diabetic kidney complication ([place]) 23/55/7322   Disturbance of skin sensation 09/02/2006   Difficulty hearing 07/25/2006   Cephalalgia 05/27/2006   Hyperlipidemia associated with type 2 diabetes mellitus ([place]) 10/22/2005   Arthritis, degenerative 10/22/2005   Social History   Tobacco Use   Smoking status: Every Day    Packs/day: 0.50    Years: 47.00    Total pack years: 23.50    Types: Cigarettes    Start date: 02/08/1974   Smokeless tobacco: Never   Tobacco comments:    1-3 cigarettes a day. 12/09/2021 khj  Substance Use Topics   Alcohol use: No    Alcohol/week: 0.0 standard drinks of alcohol   Allergies  Allergen Reactions   Codeine Nausea And Vomiting   Current Meds  Medication Sig   albuterol (VENTOLIN HFA) 108 (90 Base) MCG/ACT inhaler TAKE 2 PUFFS BY MOUTH EVERY 6 HOURS AS NEEDED FOR WHEEZE OR SHORTNESS OF BREATH   apixaban (ELIQUIS) 5 MG  TABS tablet Take 1 tablet (5 mg total) by mouth 2 (two) times daily.   atorvastatin (LIPITOR) 40 MG tablet Take 1 tablet (40 mg total) by mouth daily.   buPROPion (WELLBUTRIN SR) 150 MG 12 hr tablet Take 1 tablet (150 mg total) by mouth 2 (two) times daily.   carvedilol (COREG) 12.5 MG tablet Take 1 tablet (12.5 mg total) by mouth 2 (two) times daily with a meal.   clopidogrel (PLAVIX) 75 MG tablet Take 1 tablet (75 mg total) by mouth daily.   Dulaglutide 3 MG/0.5ML SOPN Inject into the skin once a week.   fluticasone (FLONASE) 50 MCG/ACT nasal spray Place 2 sprays into both nostrils daily.   gabapentin (NEURONTIN) 300 MG capsule Take 300 mg by mouth 2 (two) times daily.   glucose blood test strip OneTouch Verio strips   insulin aspart (NOVOLOG) 100 UNIT/ML injection Insulin pump   Insulin Disposable Pump (OMNIPOD DASH PODS, GEN 4,) MISC USE 1 POD EACH EVERY 72    HOURS   Insulin Human (INSULIN PUMP) SOLN Per endocrinoloy   loratadine (CLARITIN) 10 MG tablet Take 1 tablet by mouth daily.   losartan (COZAAR) 50 MG tablet Take 1 tablet (50 mg total) by mouth daily.   meclizine (ANTIVERT) 12.5 MG tablet  Take 1 tablet (12.5 mg total) by mouth 3 (three) times daily as needed for dizziness.   pantoprazole (PROTONIX) 40 MG tablet Take 1 tablet (40 mg total) by mouth 2 (two) times daily.   PARoxetine (PAXIL) 40 MG tablet Take 1 tablet (40 mg total) by mouth daily.   silver sulfADIAZINE (SILVADENE) 1 % cream Apply 1 Application topically daily.   Spacer/Aero-Holding Chambers [facility] DIAMOND) MISC Use with inhaler to ensure medication is delivered throughout lungs   torsemide (DEMADEX) 20 MG tablet Take 20 mg by mouth daily.   traMADol (ULTRAM) 50 MG tablet Take 1 tablet (50 mg total) by mouth every 12 (twelve) hours as needed.   Immunization History  Administered Date(s) Administered   Fluad Quad(high Dose 65+) 01/07/2021, 11/23/2021   Influenza Inj Mdck Quad Pf 12/20/2018   Influenza Split  01/05/2006, 10/31/2007, 03/03/2009, 12/16/2009, 12/23/2010, 12/24/2011, 12/19/2013   Influenza,inj,Quad PF,6+ Mos 11/23/2012, 11/07/2015, 11/15/2017   Influenza-Unspecified 11/07/2015, 11/03/2016   PFIZER(Purple Top)SARS-COV-2 Vaccination 03/01/2019, 03/22/2019   Pneumococcal Polysaccharide-23 12/23/2010   Zoster Recombinat (Shingrix) 06/03/2016, 12/11/2016       Objective:   Physical Exam BP (!) 164/84 (BP Location: Left Arm, Cuff Size: Large)   Pulse 76   Temp (!) 97 F (36.1 C)   Ht _0  (1.651 m)   Wt 243 lb 9.6 oz (110.5 kg)   SpO2 (!) 89%   BMI 40.54 kg/m  GENERAL: Obese woman, sallow appearance, presents in transport chair.  No respiratory distress.  Initial oxygen saturations at 89%, 97% after settling in room.   HEAD: Normocephalic, atraumatic.  EYES: Pupils equal, round, reactive to light.  No scleral icterus.  MOUTH: Edentulous, oral mucosa moist.  No thrush. NECK: Supple. No thyromegaly. Trachea midline. No JVD.  No adenopathy. PULMONARY: Good air entry bilaterally.  No adventitious sounds. CARDIOVASCULAR: S1 and S2. Regular rate and rhythm.  ABDOMEN: Obese otherwise benign. MUSCULOSKELETAL: No joint deformity, no clubbing, 2+ edema of the lower extremities.  NEUROLOGIC: Grossly nonfocal. SKIN: Intact,warm,dry.  Chronic stasis changes lower extremities wound on the shin of left leg healing. PSYCH: Flat affect.   Recent Results (from the past 2160 hour(s))  ECHOCARDIOGRAM COMPLETE     Status: None   Collection Time: 09/29/21  1:33 PM  Result Value Ref Range   AR max vel 2.80 cm2   AV Peak grad 5.5 mmHg   Ao pk vel 1.17 m/s   S' Lateral 3.60 cm   Area-P 1/2 2.69 cm2   AV Area VTI 2.67 cm2   AV Mean grad 3.0 mmHg   Single Plane A4C EF 48.7 %   Single Plane A2C EF 46.9 %   Calc EF 46.7 %   AV Area mean vel 2.65 cm2  T4     Status: None   Collection Time: 10/06/21 12:38 PM  Result Value Ref Range   T4, Total 7.2 4.5 - 12.0 ug/dL    Comment:  (NOTE) Performed At: [facility] [place], [place] [id number] Rush Farmer MD VO:[id number]   TSH     Status: None   Collection Time: 10/06/21 12:38 PM  Result Value Ref Range   TSH 2.977 0.350 - 4.500 uIU/mL    Comment: Performed by a 3rd Generation assay with a functional sensitivity of <=0.01 uIU/mL. Performed at [facility], [address] [place], [place] [address]   ToxASSURE Select 13 (MW), Urine     Status: None   Collection Time: 11/03/21  3:14 PM  Result  Value Ref Range   Summary Note     Comment: ==================================================================== ToxASSURE Select 13 (MW) ==================================================================== Test                             Result       Flag       Units  Drug Present and Declared for Prescription Verification   Tramadol                       8985         EXPECTED   ng/mg creat   O-Desmethyltramadol            5164         EXPECTED   ng/mg creat   N-Desmethyltramadol            2718         EXPECTED   ng/mg creat    Source of tramadol is a prescription medication. O-desmethyltramadol    and N-desmethyltramadol are expected metabolites of tramadol.  ==================================================================== Test                      Result    Flag   Units      Ref Range   Creatinine              33               mg/dL      >=20 ==================================================================== Declared Medications:  The flagging and interpretation on this report are based on the  follo wing declared medications.  Unexpected results may arise from  inaccuracies in the declared medications.   **Note: The testing scope of this panel includes these medications:   Tramadol (Ultram)   **Note: The testing scope of this panel does not include the  following reported medications:   Albuterol (Ventolin HFA)  Apixaban (Eliquis)  Atorvastatin (Lipitor)   Carvedilol (Coreg)  Clopidogrel (Plavix)  Dulaglutide  Fluticasone (Flonase)  Gabapentin (Neurontin)  Insulin (NovoLog)  Loratadine (Claritin)  Losartan (Cozaar)  Pantoprazole (Protonix)  Paroxetine (Paxil)  Sulfadiazine (Silvadene)  Torsemide (Demadex) ==================================================================== For clinical consultation, please call [phone]. ====================================================================   Hemoglobin A1c     Status: Abnormal   Collection Time: 11/23/21  3:53 PM  Result Value Ref Range   Hgb A1c MFr Bld 10.2 (H) 4.8 - 5.6 %    Comment:          Prediabetes: 5.7 - 6.4          Diabetes: >6.4          Glycemic control for adults with diabetes: <7.0    Est. average glucose Bld gHb Est-mCnc 246 mg/dL  Comprehensive metabolic panel     Status: Abnormal   Collection Time: 11/23/21  3:53 PM  Result Value Ref Range   Glucose 148 (H) 70 - 99 mg/dL   BUN 56 (H) 8 - 27 mg/dL   Creatinine, Ser 3.38 (H) 0.57 - 1.00 mg/dL   eGFR 14 (L) >59 mL/min/1.73   BUN/Creatinine Ratio 17 12 - 28   Sodium 140 134 - 144 mmol/L   Potassium 5.6 (H) 3.5 - 5.2 mmol/L   Chloride 102 96 - 106 mmol/L   CO2 23 20 - 29 mmol/L   Calcium 6.1 (LL) 8.7 - 10.3 mg/dL    Comment: **Verified by repeat analysis**   Total Protein 6.1 6.0 - 8.5 g/dL  Albumin 3.6 (L) 3.9 - 4.9 g/dL   Globulin, Total 2.5 1.5 - 4.5 g/dL   Albumin/Globulin Ratio 1.4 1.2 - 2.2   Bilirubin Total <0.2 0.0 - 1.2 mg/dL   Alkaline Phosphatase 104 44 - 121 IU/L   AST 18 0 - 40 IU/L   ALT 11 0 - 32 IU/L  CBC with Differential/Platelet     Status: Abnormal   Collection Time: 11/23/21  3:53 PM  Result Value Ref Range   WBC 14.9 (H) 3.4 - 10.8 x10E3/uL   RBC 3.27 (L) 3.77 - 5.28 x10E6/uL   Hemoglobin 8.2 (L) 11.1 - 15.9 g/dL   Hematocrit 27.5 (L) 34.0 - 46.6 %   MCV 84 79 - 97 fL   MCH 25.1 (L) 26.6 - 33.0 pg   MCHC 29.8 (L) 31.5 - 35.7 g/dL   RDW 15.7 (H) 11.7 - 15.4 %   Platelets  464 (H) 150 - 450 x10E3/uL   Neutrophils 74 Not Estab. %   Lymphs 14 Not Estab. %   Monocytes 7 Not Estab. %   Eos 4 Not Estab. %   Basos 1 Not Estab. %   Neutrophils Absolute 11.1 (H) 1.4 - 7.0 x10E3/uL   Lymphocytes Absolute 2.1 0.7 - 3.1 x10E3/uL   Monocytes Absolute 1.0 (H) 0.1 - 0.9 x10E3/uL   EOS (ABSOLUTE) 0.6 (H) 0.0 - 0.4 x10E3/uL   Basophils Absolute 0.1 0.0 - 0.2 x10E3/uL   Immature Granulocytes 0 Not Estab. %   Immature Grans (Abs) 0.0 0.0 - 0.1 x10E3/uL  Comprehensive Metabolic Panel (CMET)     Status: Abnormal   Collection Time: 12/04/21  3:42 PM  Result Value Ref Range   Glucose 327 (H) 70 - 99 mg/dL   BUN 38 (H) 8 - 27 mg/dL   Creatinine, Ser 2.51 (H) 0.57 - 1.00 mg/dL   eGFR 20 (L) >59 mL/min/1.73   BUN/Creatinine Ratio 15 12 - 28   Sodium 140 134 - 144 mmol/L   Potassium 6.3 (H) 3.5 - 5.2 mmol/L   Chloride 107 (H) 96 - 106 mmol/L   CO2 18 (L) 20 - 29 mmol/L   Calcium 6.0 (LL) 8.7 - 10.3 mg/dL    Comment: **Verified by repeat analysis**   Total Protein 6.0 6.0 - 8.5 g/dL   Albumin 3.6 (L) 3.9 - 4.9 g/dL   Globulin, Total 2.4 1.5 - 4.5 g/dL   Albumin/Globulin Ratio 1.5 1.2 - 2.2   Bilirubin Total <0.2 0.0 - 1.2 mg/dL   Alkaline Phosphatase 106 44 - 121 IU/L   AST 13 0 - 40 IU/L   ALT 11 0 - 32 IU/L  CBC with Differential/Platelet     Status: Abnormal   Collection Time: 12/04/21  3:42 PM  Result Value Ref Range   WBC 11.8 (H) 3.4 - 10.8 x10E3/uL   RBC 2.99 (L) 3.77 - 5.28 x10E6/uL   Hemoglobin 7.2 (L) 11.1 - 15.9 g/dL   Hematocrit 24.7 (L) 34.0 - 46.6 %   MCV 83 79 - 97 fL   MCH 24.1 (L) 26.6 - 33.0 pg   MCHC 29.1 (L) 31.5 - 35.7 g/dL   RDW 15.4 11.7 - 15.4 %   Platelets 484 (H) 150 - 450 x10E3/uL   Neutrophils 74 Not Estab. %   Lymphs 13 Not Estab. %   Monocytes 8 Not Estab. %   Eos 4 Not Estab. %   Basos 1 Not Estab. %   Neutrophils Absolute 8.7 (H) 1.4 - 7.0 x10E3/uL  Lymphocytes Absolute 1.6 0.7 - 3.1 x10E3/uL   Monocytes Absolute 0.9 0.1 -  0.9 x10E3/uL   EOS (ABSOLUTE) 0.5 (H) 0.0 - 0.4 x10E3/uL   Basophils Absolute 0.1 0.0 - 0.2 x10E3/uL   Immature Granulocytes 0 Not Estab. %   Immature Grans (Abs) 0.0 0.0 - 0.1 x10E3/uL  Lipid panel     Status: Abnormal   Collection Time: 12/04/21  3:42 PM  Result Value Ref Range   Cholesterol, Total 107 100 - 199 mg/dL   Triglycerides 112 0 - 149 mg/dL   HDL 36 (L) >39 mg/dL   VLDL Cholesterol Cal 21 5 - 40 mg/dL   LDL Chol Calc (NIH) 50 0 - 99 mg/dL   Chol/HDL Ratio 3.0 0.0 - 4.4 ratio    Comment:                                   T. Chol/HDL Ratio                                             Men  Women                               1/2 Avg.Risk  3.4    3.3                                   Avg.Risk  5.0    4.4                                2X Avg.Risk  9.6    7.1                                3X Avg.Risk 23.4   11.0   CBC with Differential     Status: Abnormal   Collection Time: 12/07/21  5:44 PM  Result Value Ref Range   WBC 13.9 (H) 4.0 - 10.5 K/uL   RBC 3.04 (L) 3.87 - 5.11 MIL/uL   Hemoglobin 7.2 (L) 12.0 - 15.0 g/dL   HCT 24.6 (L) 36.0 - 46.0 %   MCV 80.9 80.0 - 100.0 fL   MCH 23.7 (L) 26.0 - 34.0 pg   MCHC 29.3 (L) 30.0 - 36.0 g/dL   RDW 17.5 (H) 11.5 - 15.5 %   Platelets 535 (H) 150 - 400 K/uL   nRBC 0.0 0.0 - 0.2 %   Neutrophils Relative % 73 %   Neutro Abs 10.1 (H) 1.7 - 7.7 K/uL   Lymphocytes Relative 14 %   Lymphs Abs 2.0 0.7 - 4.0 K/uL   Monocytes Relative 8 %   Monocytes Absolute 1.1 (H) 0.1 - 1.0 K/uL   Eosinophils Relative 4 %   Eosinophils Absolute 0.6 (H) 0.0 - 0.5 K/uL   Basophils Relative 1 %   Basophils Absolute 0.1 0.0 - 0.1 K/uL   WBC Morphology MORPHOLOGY UNREMARKABLE    Immature Granulocytes 0 %   Abs Immature Granulocytes 0.06 0.00 - 0.07 K/uL   Tear Drop Cells PRESENT    Target Cells PRESENT  Ovalocytes PRESENT    Giant PLTs PRESENT     Comment: Performed at [facility], [address] [place], [place] [address]   Comprehensive metabolic panel     Status: Abnormal   Collection Time: 12/07/21  5:44 PM  Result Value Ref Range   Sodium 139 135 - 145 mmol/L   Potassium 5.1 3.5 - 5.1 mmol/L   Chloride 111 98 - 111 mmol/L   CO2 21 (L) 22 - 32 mmol/L   Glucose, Bld 136 (H) 70 - 99 mg/dL    Comment: Glucose reference range applies only to samples taken after fasting for at least 8 hours.   BUN 41 (H) 8 - 23 mg/dL   Creatinine, Ser 2.25 (H) 0.44 - 1.00 mg/dL   Calcium 6.6 (L) 8.9 - 10.3 mg/dL   Total Protein 6.8 6.5 - 8.1 g/dL   Albumin 3.2 (L) 3.5 - 5.0 g/dL   AST 12 (L) 15 - 41 U/L   ALT 10 0 - 44 U/L   Alkaline Phosphatase 92 38 - 126 U/L   Total Bilirubin 0.5 0.3 - 1.2 mg/dL   GFR, Estimated 23 (L) >60 mL/min    Comment: (NOTE) Calculated using the CKD-EPI Creatinine Equation (2021)    Anion gap 7 5 - 15    Comment: Performed at [facility], [address], [place], [place] [address]      Assessment & Plan:     ICD-10-CM   1. COPD suggested by initial evaluation [facility])  J44.9    Patient has rescheduled PFTs to 12/29/2021 Continue use of as needed albuterol    2. SOB (shortness of breath) on exertion  R06.02    Describes mostly as fatigue Suspect multifactorial Obesity, deconditioning, diastolic dysfunction COPD, anemia all likely add to this symptom    3. Nocturnal hypoxemia due to obesity  E66.9    G47.36    Qualified for nocturnal oxygen Prior sleep study did not show sleep apnea only hypoxemia We will resubmit request to [employer]    4. Chronic diastolic heart failure (HCC)  I50.32    This issue adds complexity to her management Follows with cardiology    5. Anemia of chronic kidney failure, stage 4 (severe) (HCC)  N18.4    D63.1    This issue adds complexity to her management Adds to her sensation of dyspnea/fatigue    6. CKD (chronic kidney disease), stage IV (HCC)  N18.4    This issue adds complexity to her management Continue follow-up with renal    7.  Obesity, Class III, BMI 40-49.9 (morbid obesity) (HCC)  E66.01    This issue adds complexity to her management Suspect element of obesity with alveolar hypoventilation     We will resubmit the request for oxygen that was placed in September for the patient.  The patient will contact primary care and renal for follow-ups with regards to her current laboratory data.  She is to keep her PFT appointment on 21 November.  We will see her in follow-up in 6 to 8 weeks time she is to contact us prior to that time should any new difficulties arise.  Renold Don, MD [facility] P[facility]  [facility]    *This note was dictated using voice recognition software/Dragon.  Despite best efforts to proofread, errors can occur which can change the meaning. Any transcriptional errors that result from this process are unintentional and may not be fully corrected at the time of dictation.

## 2021-12-09 NOTE — Telephone Encounter (Signed)
I sent urgent message to [employer] asking them to check on this issue

## 2021-12-09 NOTE — Telephone Encounter (Signed)
A referral was sent to [employer] for Oxygen for this patient on 10/30/2021. The patient has not heard from them. Can you check on this for her? Thank you!

## 2021-12-09 NOTE — Patient Instructions (Signed)
We are going to check what happens with your oxygen that was ordered in September.  You will need oxygen at nighttime.  We will see you in follow-up in 6 to 8 weeks time.  I will check on your breathing test once these are available.  I have sent a message to your primary provider so they can follow-up on the blood work that you had done yesterday.

## 2021-12-10 ENCOUNTER — Ambulatory Visit (INDEPENDENT_AMBULATORY_CARE_PROVIDER_SITE_OTHER): Payer: Medicare Other | Admitting: Vascular Surgery

## 2021-12-10 ENCOUNTER — Encounter (INDEPENDENT_AMBULATORY_CARE_PROVIDER_SITE_OTHER): Payer: Self-pay

## 2021-12-10 ENCOUNTER — Other Ambulatory Visit: Payer: Self-pay

## 2021-12-10 ENCOUNTER — Other Ambulatory Visit: Payer: Self-pay | Admitting: Family Medicine

## 2021-12-10 ENCOUNTER — Telehealth: Payer: Self-pay

## 2021-12-10 ENCOUNTER — Telehealth: Payer: Self-pay | Admitting: Pulmonary Disease

## 2021-12-10 ENCOUNTER — Encounter (INDEPENDENT_AMBULATORY_CARE_PROVIDER_SITE_OTHER): Payer: Medicare Other

## 2021-12-10 ENCOUNTER — Telehealth: Payer: Self-pay | Admitting: Cardiology

## 2021-12-10 DIAGNOSIS — E1169 Type 2 diabetes mellitus with other specified complication: Secondary | ICD-10-CM

## 2021-12-10 DIAGNOSIS — I70248 Atherosclerosis of native arteries of left leg with ulceration of other part of lower left leg: Secondary | ICD-10-CM | POA: Diagnosis not present

## 2021-12-10 DIAGNOSIS — E1151 Type 2 diabetes mellitus with diabetic peripheral angiopathy without gangrene: Secondary | ICD-10-CM | POA: Diagnosis not present

## 2021-12-10 DIAGNOSIS — I152 Hypertension secondary to endocrine disorders: Secondary | ICD-10-CM

## 2021-12-10 DIAGNOSIS — Z86010 Personal history of colonic polyps: Secondary | ICD-10-CM

## 2021-12-10 DIAGNOSIS — E1129 Type 2 diabetes mellitus with other diabetic kidney complication: Secondary | ICD-10-CM

## 2021-12-10 DIAGNOSIS — I110 Hypertensive heart disease with heart failure: Secondary | ICD-10-CM | POA: Diagnosis not present

## 2021-12-10 DIAGNOSIS — F339 Major depressive disorder, recurrent, unspecified: Secondary | ICD-10-CM

## 2021-12-10 DIAGNOSIS — N184 Chronic kidney disease, stage 4 (severe): Secondary | ICD-10-CM | POA: Diagnosis not present

## 2021-12-10 DIAGNOSIS — E1122 Type 2 diabetes mellitus with diabetic chronic kidney disease: Secondary | ICD-10-CM | POA: Diagnosis not present

## 2021-12-10 DIAGNOSIS — I5032 Chronic diastolic (congestive) heart failure: Secondary | ICD-10-CM | POA: Diagnosis not present

## 2021-12-10 DIAGNOSIS — I5022 Chronic systolic (congestive) heart failure: Secondary | ICD-10-CM

## 2021-12-10 MED ORDER — GOLYTELY 236 G PO SOLR: 4000.0000 mL | Freq: Once | ORAL | 0 refills | Status: AC

## 2021-12-10 NOTE — Telephone Encounter (Signed)
Pt returned our call shared note from provider to follow up with specialists. No questions at this time.

## 2021-12-10 NOTE — Addendum Note (Signed)
Addended by: Claudette Head A on: 12/10/2021 08:36 AM   Modules accepted: Orders

## 2021-12-10 NOTE — Telephone Encounter (Signed)
   Patient Name: Jocelyn Wells  DOB: 17-Apr-1955 MRN: [id number]  Primary Cardiologist: None  Chart reviewed as part of pre-operative protocol coverage.   From a cardiac standpoint, patient okay to hold Plavix for 5 days prior to colonoscopy.  However, patient takes Plavix also for history of PVD, recent peripheral vascular intervention, managed per vascular surgery.  She is on Eliquis for history of DVT.  Cardiology does not manage patient's Eliquis.  Therefore, final recommendations for holding Plavix and prior to procedure should come from additional managing providers.  I will route this recommendation to the requesting provider and remove this request from the preop pool.  Lenna Sciara, NP 12/10/2021, 1:27 PM

## 2021-12-10 NOTE — Telephone Encounter (Signed)
Returned call from patient's husband. Daneil Dan said to refer to ED notes and f/u with pulm and nephro. Okay for P[facility] to advise.

## 2021-12-10 NOTE — Telephone Encounter (Signed)
Gastroenterology Pre-Procedure Review  Request Date: 01/04/22 Requesting Physician: Dr. Marius Ditch  PATIENT REVIEW QUESTIONS: The patient responded to the following health history questions as indicated:    1. Are you having any GI issues? yes (nausea an vomiting) 2. Do you have a personal history of Polyps? yes (09/22/16 colonoscopy performed by Dr. Vira Agar) 3. Do you have a family history of Colon Cancer or Polyps? no 4. Diabetes Mellitus? yes (patient takes Trulicity has been advised to hold it 7 days prior to colonoscopy on 12/28/21) 5. Joint replacements in the past 12 months?yes (July 5th 2023 leg stents) 6. Major health problems in the past 3 months?no 7. Any artificial heart valves, MVP, or defibrillator?no    MEDICATIONS & ALLERGIES:    Patient reports the following regarding taking any anticoagulation/antiplatelet therapy:   Plavix, Coumadin, Eliquis, Xarelto, Lovenox, Pradaxa, Brilinta, or Effient? yes (Patient takes Eliquis and Plavix Dr. Mylo Red) Aspirin? no  Patient confirms/reports the following medications:  Current Outpatient Medications  Medication Sig Dispense Refill   albuterol (VENTOLIN HFA) 108 (90 Base) MCG/ACT inhaler TAKE 2 PUFFS BY MOUTH EVERY 6 HOURS AS NEEDED FOR WHEEZE OR SHORTNESS OF BREATH 8.5 each 2   apixaban (ELIQUIS) 5 MG TABS tablet Take 1 tablet (5 mg total) by mouth 2 (two) times daily. 180 tablet 3   atorvastatin (LIPITOR) 40 MG tablet Take 1 tablet (40 mg total) by mouth daily. 90 tablet 3   buPROPion (WELLBUTRIN SR) 150 MG 12 hr tablet Take 1 tablet (150 mg total) by mouth 2 (two) times daily. 60 tablet 1   carvedilol (COREG) 12.5 MG tablet Take 1 tablet (12.5 mg total) by mouth 2 (two) times daily with a meal. 180 tablet 0   clopidogrel (PLAVIX) 75 MG tablet Take 1 tablet (75 mg total) by mouth daily. 90 tablet 3   Dulaglutide 3 MG/0.5ML SOPN Inject into the skin once a week.     fluticasone (FLONASE) 50 MCG/ACT nasal spray Place 2 sprays into both  nostrils daily. 16 g 6   gabapentin (NEURONTIN) 300 MG capsule Take 300 mg by mouth 2 (two) times daily.     glucose blood test strip OneTouch Verio strips     insulin aspart (NOVOLOG) 100 UNIT/ML injection Insulin pump     Insulin Disposable Pump (OMNIPOD DASH PODS, GEN 4,) MISC USE 1 POD EACH EVERY 72    HOURS     Insulin Human (INSULIN PUMP) SOLN Per endocrinoloy     loratadine (CLARITIN) 10 MG tablet Take 1 tablet by mouth daily.     losartan (COZAAR) 50 MG tablet Take 1 tablet (50 mg total) by mouth daily. 90 tablet 0   meclizine (ANTIVERT) 12.5 MG tablet Take 1 tablet (12.5 mg total) by mouth 3 (three) times daily as needed for dizziness. 30 tablet 0   pantoprazole (PROTONIX) 40 MG tablet Take 1 tablet (40 mg total) by mouth 2 (two) times daily. 60 tablet 0   PARoxetine (PAXIL) 40 MG tablet Take 1 tablet (40 mg total) by mouth daily. 90 tablet 1   silver sulfADIAZINE (SILVADENE) 1 % cream Apply 1 Application topically daily. 400 g 0   Spacer/Aero-Holding Chambers (OPTICHAMBER DIAMOND) MISC Use with inhaler to ensure medication is delivered throughout lungs 1 each 0   torsemide (DEMADEX) 20 MG tablet Take 20 mg by mouth daily.     traMADol (ULTRAM) 50 MG tablet Take 1 tablet (50 mg total) by mouth every 12 (twelve) hours as needed. 60 tablet 3  No current facility-administered medications for this visit.    Patient confirms/reports the following allergies:  Allergies  Allergen Reactions   Codeine Nausea And Vomiting    No orders of the defined types were placed in this encounter.   AUTHORIZATION INFORMATION Primary Insurance: 1D#: Group #:  Secondary Insurance: 1D#: Group #:  SCHEDULE INFORMATION: Date: 01/04/22 Time: Location: [facility]

## 2021-12-10 NOTE — Telephone Encounter (Signed)
   Pre-operative Risk Assessment    Patient Name: Jocelyn Wells  DOB: 10/05/1955 MRN: [id number]      Request for Surgical Clearance    Procedure:   COLONOSCOPY  Date of Surgery:  Clearance 01/04/22                                 Surgeon:  NOT INDICATED Surgeon's Group or Practice Name:  [facility] Phone number:  [phone] Fax number:  [phone]  Type of Clearance Requested:   PHARMACY-PLAVIX/ELIQUIS   Type of Anesthesia:  General    Additional requests/questions:    SignedEli Phillips   12/10/2021, 12:49 PM

## 2021-12-11 ENCOUNTER — Ambulatory Visit (INDEPENDENT_AMBULATORY_CARE_PROVIDER_SITE_OTHER): Payer: Medicare Other

## 2021-12-11 ENCOUNTER — Telehealth: Payer: Self-pay

## 2021-12-11 DIAGNOSIS — I5022 Chronic systolic (congestive) heart failure: Secondary | ICD-10-CM

## 2021-12-11 DIAGNOSIS — I152 Hypertension secondary to endocrine disorders: Secondary | ICD-10-CM

## 2021-12-11 DIAGNOSIS — E1129 Type 2 diabetes mellitus with other diabetic kidney complication: Secondary | ICD-10-CM

## 2021-12-11 NOTE — Chronic Care Management (AMB) (Signed)
Chronic Care Management Provider Comprehensive Care Plan    12/11/2021 Name: Jocelyn Wells MRN: [id number] DOB: 01-08-1956  Referral to Chronic Care Management (CCM) services was placed by Provider:  Gwyneth Sprout, FNP on 12/10/21.  Chronic Condition 1: DM Provider Assessment and Plan  Type 2 diabetes mellitus with other diabetic kidney complication (HCC)       Chronic, uncontrolled Unclear if hypoglycemia is the cause of dizziness given no records today from insulin pump/CGM Has upcoming appt with endocrine for f/u Non healing wound to L leg remains Unclear of dosage with current pump       Expected Outcome/Goals Addressed This Visit (Provider CCM goals/Provider Assessment and plan  Goal: CCM (Diabetes) Expected Outcome: Monitor, Self-Manage and Reduce Symptoms of Diabetes   Symptom Management Condition 1: Take all medications as prescribed Attend all scheduled provider appointments Call pharmacy for medication refills 3-7 days in advance of running out of medications Call provider office for new concerns or questions  Keep appointment with eye doctor Continue monitoring CGM device  Check feet daily for cuts, sores or redness Wash and dry feet carefully every day Wear comfortable, well-fitting shoes Monitor carbohydrate intake and avoid foods with added sugars when possible    Chronic Condition 2: HTN Provider Assessment and Plan  Hypertension associated with diabetes ([place])       Chronic, borderline However, recent complaints of dizziness and recent fall Goal <130/<80 Coreg 12.5, Losartan 50, torsemide 20          Expected Outcome/Goals Addressed This Visit (Provider CCM goals/Provider Assessment and plan  Goal: CCM (HTN) Expected Outcome: Monitor, Self-Manage and Reduce Symptoms of Hypertension   Symptom Management Condition 2: Take all medications as prescribed Attend all scheduled provider appointments Call pharmacy for medication refills 3-7 days in  advance of running out of medications Call provider office for new concerns or questions  Check blood pressure daily Keep a blood pressure log Call doctor for signs and symptoms of high blood pressure Report new symptoms to your doctor   Chronic Condition 3: HF Provider Assessment and Plan: Heart failure, unspecified ([place])       Chronic, worsening Weight up 20# in 1 month Pt with borderline SPO2 at 92% Has restarted smoking Referral back to HF clinic for fluid management       Expected Outcome/Goals Addressed This Visit (Provider CCM goals/Provider Assessment and plan  Goal: CCM (CHF) Expected Outcome: Monitor, Self-Manage and Reduce Symptoms of Congestive Heart Failure  Symptom Management Condition 3: Take medications as prescribed   Attend all scheduled provider appointments Call pharmacy for medication refills 3-7 days in advance of running out of medications Call provider office for new concerns or questions  Call office for weight gain more than 2 pounds in one day or 5 pounds in one week Weigh myself daily Track weight in diary Watch for swelling in feet, ankles and legs every day Keep legs up while sitting Track symptoms  Seek immediate medical attention for worsening symptoms   Problem List Patient Active Problem List   Diagnosis Date Noted   Personal history of nicotine dependence 12/04/2021   Leukocytosis 12/04/2021   Long toenail 12/04/2021   Screening for colon cancer 12/04/2021   Depression, recurrent ([place]) 12/04/2021   Hospital discharge follow-up 09/02/2021   Hyperkalemia 08/15/2021   Acute on chronic diastolic CHF (congestive heart failure) ([place]) 08/14/2021   Atherosclerosis of native arteries of the extremities with ulceration ([place]) 08/12/2021   Positive depression screening 07/14/2021  DOE (dyspnea on exertion) 07/14/2021   Bilateral leg edema 07/14/2021   Fall 04/13/2021   Anemia in chronic kidney disease 03/18/2021   Heart failure, unspecified  ([place]) 03/18/2021   Hyperparathyroidism due to renal insufficiency ([place]) 03/18/2021   Chronic depression 03/18/2021   Overweight 03/18/2021   Poor appetite 02/13/2021   MDD (major depressive disorder), recurrent episode, mild ([place]) 02/13/2021   Benign hypertensive kidney disease with chronic kidney disease 10/14/2020   CKD (chronic kidney disease), stage IV ([place]) 05/31/2020   Compression fracture of L1 lumbar vertebra ([place]) 01/30/2019   Compression fracture of body of thoracic vertebra ([place]) 01/30/2019   Lumbar foraminal stenosis (RIGHT L1) 01/30/2019   Spinal stenosis of lumbar region with neurogenic claudication (L4-L5) 01/30/2019   Osteoporosis, post-menopausal 04/14/2018   Vitamin D deficiency 04/14/2018   Chronic deep vein thrombosis (DVT) ([place]) 02/09/2018   Post-menopausal 11/17/2017   Aortic atherosclerosis ([place]) 11/08/2017   Snoring 04/24/2015   Sleep paralysis, recurrent isolated 04/24/2015   Hypersomnia with sleep apnea 04/24/2015   Cataplexy 04/24/2015   Morbid obesity due to excess calories ([place]) 04/24/2015   COPD (chronic obstructive pulmonary disease) ([place]) 50/53/9767   Embolic stroke ([place]) 34/19/3790   Acute renal failure superimposed on stage 4 chronic kidney disease ([place]) 01/29/2015   Dizziness 07/05/2014   Hypertension associated with diabetes ([place]) 07/05/2014   Carbuncle and furuncle 07/05/2014   Pain of perianal area 07/05/2014   Guttate psoriasis 07/05/2014   H/O: osteoarthritis 06/05/2014   Anxiety, generalized 06/05/2014   Microalbuminuria 12/23/2013   Long term current use of insulin ([place]) 12/23/2013   Type 2 diabetes mellitus with other diabetic kidney complication ([place]) 24/10/7351   Disturbance of skin sensation 09/02/2006   Difficulty hearing 07/25/2006   Cephalalgia 05/27/2006   Hyperlipidemia associated with type 2 diabetes mellitus ([place]) 10/22/2005   Arthritis, degenerative 10/22/2005    Medication Management  Current Outpatient Medications:     albuterol (VENTOLIN HFA) 108 (90 Base) MCG/ACT inhaler, TAKE 2 PUFFS BY MOUTH EVERY 6 HOURS AS NEEDED FOR WHEEZE OR SHORTNESS OF BREATH, Disp: 8.5 each, Rfl: 2   apixaban (ELIQUIS) 5 MG TABS tablet, Take 1 tablet (5 mg total) by mouth 2 (two) times daily., Disp: 180 tablet, Rfl: 3   atorvastatin (LIPITOR) 40 MG tablet, Take 1 tablet (40 mg total) by mouth daily., Disp: 90 tablet, Rfl: 3   buPROPion (WELLBUTRIN SR) 150 MG 12 hr tablet, Take 1 tablet (150 mg total) by mouth 2 (two) times daily., Disp: 60 tablet, Rfl: 1   carvedilol (COREG) 12.5 MG tablet, Take 1 tablet (12.5 mg total) by mouth 2 (two) times daily with a meal., Disp: 180 tablet, Rfl: 0   clopidogrel (PLAVIX) 75 MG tablet, Take 1 tablet (75 mg total) by mouth daily., Disp: 90 tablet, Rfl: 3   Dulaglutide 3 MG/0.5ML SOPN, Inject into the skin once a week., Disp: , Rfl:    fluticasone (FLONASE) 50 MCG/ACT nasal spray, Place 2 sprays into both nostrils daily., Disp: 16 g, Rfl: 6   gabapentin (NEURONTIN) 300 MG capsule, Take 300 mg by mouth 2 (two) times daily., Disp: , Rfl:    glucose blood test strip, OneTouch Verio strips, Disp: , Rfl:    insulin aspart (NOVOLOG) 100 UNIT/ML injection, Insulin pump, Disp: , Rfl:    Insulin Disposable Pump (OMNIPOD DASH PODS, GEN 4,) MISC, USE 1 POD EACH EVERY 72    HOURS, Disp: , Rfl:    Insulin Human (INSULIN PUMP) SOLN, Per endocrinoloy, Disp: ,  Rfl:    loratadine (CLARITIN) 10 MG tablet, Take 1 tablet by mouth daily., Disp: , Rfl:    losartan (COZAAR) 50 MG tablet, Take 1 tablet (50 mg total) by mouth daily., Disp: 90 tablet, Rfl: 0   meclizine (ANTIVERT) 12.5 MG tablet, Take 1 tablet (12.5 mg total) by mouth 3 (three) times daily as needed for dizziness., Disp: 30 tablet, Rfl: 0   pantoprazole (PROTONIX) 40 MG tablet, Take 1 tablet (40 mg total) by mouth 2 (two) times daily., Disp: 60 tablet, Rfl: 0   PARoxetine (PAXIL) 40 MG tablet, Take 1 tablet (40 mg total) by mouth daily., Disp: 90 tablet, Rfl:  1   silver sulfADIAZINE (SILVADENE) 1 % cream, Apply 1 Application topically daily., Disp: 400 g, Rfl: 0   Spacer/Aero-Holding Chambers [facility] DIAMOND) MISC, Use with inhaler to ensure medication is delivered throughout lungs, Disp: 1 each, Rfl: 0   torsemide (DEMADEX) 20 MG tablet, Take 20 mg by mouth daily., Disp: , Rfl:    traMADol (ULTRAM) 50 MG tablet, Take 1 tablet (50 mg total) by mouth every 12 (twelve) hours as needed., Disp: 60 tablet, Rfl: 3  Cognitive Assessment Identity Confirmed: : Name; DOB Cognitive Status: Normal   Functional Assessment Hearing Difficulty or Deaf: yes Wear Glasses or Blind: yes Vision Management: Wears Printmaker, Remembering or Making Decisions Difficulty (CP): no Difficulty Communicating: no Difficulty Eating/Swallowing: no Walking or Climbing Stairs Difficulty: yes Walking or Climbing Stairs: ambulation difficulty, requires equipment Dressing/Bathing Difficulty: no Doing Errands Independently Difficulty (such as shopping) (CP): yes Errands Management: Family Assist Change in Functional Status Since Onset of Current Illness/Injury: no (Reports currenty receiving home health)   Caregiver Assessment  Primary Source of Support/Comfort: spouse Name of Support/Comfort Primary Source: Araina Butrick People in Home: spouse   Planned Interventions  DM Discussed current plan for diabetes management. Reviewed medications and discussed importance of medication adherence.  Advised to continue taking all medications as prescribed. Reports son is currently assisting with medication preparation. Discussed glucose monitoring and recent readings. Reports using Dexcom continuous monitoring device. Reports most readings have been within range but notes a low reading of 53 last night. Reports decreased intake on yesterday d/t nausea. Denies episodes of vomiting. Reports reading increased to 97 after eating a snack. Provided information  regarding option for snacks and small meals. Advised to contact provider if nausea persist. Through discussion regarding s/sx of hypoglycemia and hyperglycemia along with recommended interventions.  Discussed nutritional intake and importance of complying with a diabetic diet. Discussed importance of completing recommended DM preventive care. Recalls completing foot exam. Reports eye exam is up to date. Discussed importance of completing ordered labs as prescribed.  Assessed social determinant of health barriers.   HTN Reviewed plan for hypertension management. Reviewed medications and indications for use. Advised to continue taking as prescribed. Advised to notify a provider if unable to tolerate regimen. Provided information regarding established blood pressure parameters along with indications for notifying a provider. Reports BP is currently being monitored twice a week by the Home Health team. Reports reading have been within range. Discussed activity tolerance. Reports limited mobility due to joint pain and shortness of breath with prolonged movement. Reports using walker when ambulating. Discussed compliance with recommended cardiac prudent diet. Encouraged to read nutrition labels, monitor sodium intake and avoid highly processed foods when possible. Discussed complications of uncontrolled blood pressure.  Reviewed s/sx of heart attack, stroke and worsening symptoms that require immediate medical attention.   CHF Discussed current  plan for CHF management.  Provided information regarding recommended weight parameters. Reports weighing routinely. Encouraged to weigh daily and record readings.  Encouraged to notify provider for weight gain greater than 3 lbs overnight or weight gain greater than 5 lbs within a week.  Discussed s/sx of fluid overload and indications for notifying a provider. Denies abdominal or lower extremity edema. Experiences shortness of breath with minimal activity. She  will require supplemental oxygen. The home health team is scheduled to deliver oxygen supplies later today. Encouraged to assess symptoms daily and contact clinic with changes. Discussed nutritional intake. Advised to closely monitor sodium consumption and avoid highly processed foods when possible. Advised to limit fluid intake as advised by the Cardiology team. Reviewed worsening s/sx related to CHF exacerbation and indications for seeking immediate medical attention. Assessed social determinant of health barriers. Reviewed pending appointments. Advised to complete evaluation with the Heart Failure Clinic as scheduled on 01/12/22.     Interaction and coordination with outside resources, practitioners, and providers See CCM Referral  Care Plan: Patient declined

## 2021-12-11 NOTE — Telephone Encounter (Signed)
Jocelyn Wells with [employer] responded "we are getting oxygen delivered out to her as soon as possible"

## 2021-12-11 NOTE — Chronic Care Management (AMB) (Signed)
  Chronic Care Management   CCM RN Visit Note  12/11/2021 Name: Jocelyn Wells MRN: [id number] DOB: May 30, 1955  Subjective: Jocelyn Wells is a 66 y.o. year old female who is a primary care patient of Gwyneth Sprout, FNP. The patient was referred to the Chronic Care Management team for assistance with care management needs subsequent to provider initiation of CCM services and plan of care.    Today's Visit:  Engaged with patient by telephone for initial visit.     SDOH Interventions Today    Flowsheet Row Most Recent Value  SDOH Interventions   Food Insecurity Interventions Intervention Not Indicated  Housing Interventions Intervention Not Indicated  Transportation Interventions Intervention Not Indicated  Utilities Interventions Intervention Not Indicated  Depression Interventions/Treatment  PHQ2-9 Score <4 Follow-up Not Indicated  Financial Strain Interventions Intervention Not Indicated  Physical Activity Interventions Other (Comments)  [Currently receiving in home therapy twice a week.]  Stress Interventions Other (Comment)  [Reports currently managing well/Declined current need for counseing]  Social Connections Interventions Intervention Not Indicated        PLAN: Follow up scheduled for 01/10/22.    Horris Latino RN Care Manager/Chronic Care Management [phone]

## 2021-12-11 NOTE — Telephone Encounter (Signed)
Dr. Mylo Red has granted clearance for patient to have her colonoscopy.  He has advised her to stop Plavix 5 days prior to her colonoscopy.  This would be on 12/30/21.  Left message on patients voice mail to make her aware and asked her to call me back if she has any questions.  Thanks,  Paac [place], [place]

## 2021-12-11 NOTE — Patient Instructions (Signed)
Visit Information  Thank you for allowing the Chronic Care Management team to participate in your care.  Please don't hesitate to contact me if I can be of assistance to you before our next scheduled telephone appointment.   Our next outreach is scheduled for January 14, 2022 at 3 pm. Please call the care guide team at [phone] if you need to cancel or reschedule your appointment.   Following is a copy of your full provider care plan:   Goals Addressed             This Visit's Progress    Goal: CCM (CHF) Expected Outcome: Monitor, Self-Manage and Reduce Symptoms of Congestive Heart Failure       Current Barriers:  Chronic Disease Management support and education needs related to CHF  Planned Interventions: Discussed current plan for CHF management.  Provided information regarding recommended weight parameters. Reports weighing routinely. Encouraged to weigh daily and record readings.  Encouraged to notify provider for weight gain greater than 3 lbs overnight or weight gain greater than 5 lbs within a week.  Discussed s/sx of fluid overload and indications for notifying a provider. Denies abdominal or lower extremity edema. Experiences shortness of breath with minimal activity. She will require supplemental oxygen. The home health team is scheduled to deliver oxygen supplies later today. Encouraged to assess symptoms daily and contact clinic with changes. Discussed nutritional intake. Advised to closely monitor sodium consumption and avoid highly processed foods when possible. Advised to limit fluid intake as advised by the Cardiology team. Reviewed worsening s/sx related to CHF exacerbation and indications for seeking immediate medical attention. Assessed social determinant of health barriers. Reviewed pending appointments. Advised to complete evaluation with the [facility] as scheduled on 01/12/22.  Wt Readings from Last 3 Encounters:  12/16/21 248 lb 4 oz (112.6 kg)   12/15/21 245 lb 9.6 oz (111.4 kg)  12/09/21 243 lb 9.6 oz (110.5 kg)     Symptom Management: Take medications as prescribed   Attend all scheduled provider appointments Call pharmacy for medication refills 3-7 days in advance of running out of medications Call provider office for new concerns or questions  Call office for weight gain more than 2 pounds in one day or 5 pounds in one week Weigh myself daily Track weight in diary Watch for swelling in feet, ankles and legs every day Keep legs up while sitting Track symptoms  Seek immediate medical attention for worsening symptoms   Follow Up Plan:  Will follow up next month       Goal: CCM (Diabetes) Expected Outcome: Monitor, Self-Manage and Reduce Symptoms of Diabetes       Current Barriers:  Chronic Disease Management support and education needs related to Diabetes Management  Planned Interventions: Discussed current plan for diabetes management. Reviewed medications and discussed importance of medication adherence.  Advised to continue taking all medications as prescribed. Reports son is currently assisting with medication preparation. Discussed glucose monitoring and recent readings. Reports using Dexcom continuous monitoring device. Reports most readings have been within range but notes a low reading of 53 last night. Reports decreased intake on yesterday d/t nausea. Denies episodes of vomiting. Reports reading increased to 97 after eating a snack. Provided information regarding option for snacks and small meals. Advised to contact provider if nausea persist. Through discussion regarding s/sx of hypoglycemia and hyperglycemia along with recommended interventions.  Discussed nutritional intake and importance of complying with a diabetic diet. Discussed importance of completing recommended DM preventive  care. Recalls completing foot exam. Reports eye exam is up to date. Discussed importance of completing ordered labs as prescribed.   Assessed social determinant of health barriers.   Lab Results  Component Value Date   HGBA1C 10.2 (H) 11/23/2021    Symptom Management: Take all medications as prescribed Attend all scheduled provider appointments Call pharmacy for medication refills 3-7 days in advance of running out of medications Call provider office for new concerns or questions  Keep appointment with eye doctor Continue monitoring CGM device  Check feet daily for cuts, sores or redness Wash and dry feet carefully every day Wear comfortable, well-fitting shoes Monitor carbohydrate intake and avoid foods with added sugars when possible  Follow Up Plan:  Will follow up next month       Goal: CCM (HTN) Expected Outcome: Monitor, Self-Manage and Reduce Symptoms of Hypertension       Current Barriers:  Chronic Disease Management support and education needs related to Hypertension  Planned Interventions: Reviewed plan for hypertension management. Reviewed medications and indications for use. Advised to continue taking as prescribed. Advised to notify a provider if unable to tolerate regimen. Provided information regarding established blood pressure parameters along with indications for notifying a provider. Reports BP is currently being monitored twice a week by the Home Health team. Reports reading have been within range. Discussed activity tolerance. Reports limited mobility due to joint pain and shortness of breath with prolonged movement. Reports using walker when ambulating. Discussed compliance with recommended cardiac prudent diet. Encouraged to read nutrition labels, monitor sodium intake and avoid highly processed foods when possible. Discussed complications of uncontrolled blood pressure.  Reviewed s/sx of heart attack, stroke and worsening symptoms that require immediate medical attention.  Symptom Management: Take all medications as prescribed Attend all scheduled provider appointments Call pharmacy  for medication refills 3-7 days in advance of running out of medications Call provider office for new concerns or questions  Check blood pressure daily Keep a blood pressure log Call doctor for signs and symptoms of high blood pressure Report new symptoms to your doctor  Follow Up Plan:  Will follow up next month.          Ms. Downie verbalized understanding of instructions, educational materials, and care plan provided today. Declined offer to receive copy of patient instructions, educational materials, and care plan.   Follow up scheduled for next month.     Horris Latino RN Care Manager/Chronic Care Management [phone]

## 2021-12-14 DIAGNOSIS — I5032 Chronic diastolic (congestive) heart failure: Secondary | ICD-10-CM | POA: Diagnosis not present

## 2021-12-14 DIAGNOSIS — I110 Hypertensive heart disease with heart failure: Secondary | ICD-10-CM | POA: Diagnosis not present

## 2021-12-14 DIAGNOSIS — I70248 Atherosclerosis of native arteries of left leg with ulceration of other part of lower left leg: Secondary | ICD-10-CM | POA: Diagnosis not present

## 2021-12-14 DIAGNOSIS — E1122 Type 2 diabetes mellitus with diabetic chronic kidney disease: Secondary | ICD-10-CM | POA: Diagnosis not present

## 2021-12-14 DIAGNOSIS — N184 Chronic kidney disease, stage 4 (severe): Secondary | ICD-10-CM | POA: Diagnosis not present

## 2021-12-14 DIAGNOSIS — E1151 Type 2 diabetes mellitus with diabetic peripheral angiopathy without gangrene: Secondary | ICD-10-CM | POA: Diagnosis not present

## 2021-12-15 ENCOUNTER — Inpatient Hospital Stay: Payer: Medicare Other | Attending: Oncology | Admitting: Oncology

## 2021-12-15 ENCOUNTER — Inpatient Hospital Stay: Payer: Medicare Other

## 2021-12-15 ENCOUNTER — Encounter: Payer: Self-pay | Admitting: Oncology

## 2021-12-15 VITALS — BP 143/77 | HR 71 | Temp 96.9°F | Resp 18 | Wt 245.6 lb

## 2021-12-15 DIAGNOSIS — I129 Hypertensive chronic kidney disease with stage 1 through stage 4 chronic kidney disease, or unspecified chronic kidney disease: Secondary | ICD-10-CM | POA: Diagnosis not present

## 2021-12-15 DIAGNOSIS — D72829 Elevated white blood cell count, unspecified: Secondary | ICD-10-CM | POA: Diagnosis not present

## 2021-12-15 DIAGNOSIS — N189 Chronic kidney disease, unspecified: Secondary | ICD-10-CM | POA: Insufficient documentation

## 2021-12-15 DIAGNOSIS — D649 Anemia, unspecified: Secondary | ICD-10-CM | POA: Diagnosis not present

## 2021-12-15 DIAGNOSIS — Z79899 Other long term (current) drug therapy: Secondary | ICD-10-CM | POA: Diagnosis not present

## 2021-12-15 DIAGNOSIS — F17210 Nicotine dependence, cigarettes, uncomplicated: Secondary | ICD-10-CM | POA: Diagnosis not present

## 2021-12-15 DIAGNOSIS — J449 Chronic obstructive pulmonary disease, unspecified: Secondary | ICD-10-CM | POA: Insufficient documentation

## 2021-12-15 DIAGNOSIS — D509 Iron deficiency anemia, unspecified: Secondary | ICD-10-CM

## 2021-12-15 DIAGNOSIS — D75839 Thrombocytosis, unspecified: Secondary | ICD-10-CM | POA: Insufficient documentation

## 2021-12-15 LAB — CBC WITH DIFFERENTIAL/PLATELET
Abs Immature Granulocytes: 0.05 10*3/uL (ref 0.00–0.07)
Basophils Absolute: 0.1 10*3/uL (ref 0.0–0.1)
Basophils Relative: 1 %
Eosinophils Absolute: 0.2 10*3/uL (ref 0.0–0.5)
Eosinophils Relative: 2 %
HCT: 23.4 % — ABNORMAL LOW (ref 36.0–46.0)
Hemoglobin: 6.7 g/dL — ABNORMAL LOW (ref 12.0–15.0)
Immature Granulocytes: 0 %
Lymphocytes Relative: 13 %
Lymphs Abs: 1.6 10*3/uL (ref 0.7–4.0)
MCH: 22.9 pg — ABNORMAL LOW (ref 26.0–34.0)
MCHC: 28.6 g/dL — ABNORMAL LOW (ref 30.0–36.0)
MCV: 80.1 fL (ref 80.0–100.0)
Monocytes Absolute: 1 10*3/uL (ref 0.1–1.0)
Monocytes Relative: 8 %
Neutro Abs: 9.4 10*3/uL — ABNORMAL HIGH (ref 1.7–7.7)
Neutrophils Relative %: 76 %
Platelets: 496 10*3/uL — ABNORMAL HIGH (ref 150–400)
RBC: 2.92 MIL/uL — ABNORMAL LOW (ref 3.87–5.11)
RDW: 17.6 % — ABNORMAL HIGH (ref 11.5–15.5)
WBC: 12.4 10*3/uL — ABNORMAL HIGH (ref 4.0–10.5)
nRBC: 0 % (ref 0.0–0.2)

## 2021-12-15 LAB — COMPREHENSIVE METABOLIC PANEL
ALT: 10 U/L (ref 0–44)
AST: 13 U/L — ABNORMAL LOW (ref 15–41)
Albumin: 3.1 g/dL — ABNORMAL LOW (ref 3.5–5.0)
Alkaline Phosphatase: 81 U/L (ref 38–126)
Anion gap: 6 (ref 5–15)
BUN: 38 mg/dL — ABNORMAL HIGH (ref 8–23)
CO2: 22 mmol/L (ref 22–32)
Calcium: 6.8 mg/dL — ABNORMAL LOW (ref 8.9–10.3)
Chloride: 109 mmol/L (ref 98–111)
Creatinine, Ser: 2.68 mg/dL — ABNORMAL HIGH (ref 0.44–1.00)
GFR, Estimated: 19 mL/min — ABNORMAL LOW (ref >60)
Glucose, Bld: 133 mg/dL — ABNORMAL HIGH (ref 70–99)
Potassium: 5.4 mmol/L — ABNORMAL HIGH (ref 3.5–5.1)
Sodium: 137 mmol/L (ref 135–145)
Total Bilirubin: 0.4 mg/dL (ref 0.3–1.2)
Total Protein: 6.4 g/dL — ABNORMAL LOW (ref 6.5–8.1)

## 2021-12-15 LAB — FOLATE: Folate: 7.1 ng/mL (ref >5.9)

## 2021-12-15 LAB — LACTATE DEHYDROGENASE: LDH: 217 U/L — ABNORMAL HIGH (ref 98–192)

## 2021-12-15 LAB — RETICULOCYTES
Immature Retic Fract: 6.4 % (ref 2.3–15.9)
RBC.: 2.6 MIL/uL — ABNORMAL LOW (ref 3.87–5.11)
Retic Count, Absolute: 113 10*3/uL (ref 19.0–186.0)
Retic Ct Pct: 4.4 % — ABNORMAL HIGH (ref 0.4–3.1)

## 2021-12-15 LAB — FERRITIN: Ferritin: 4 ng/mL — ABNORMAL LOW (ref 11–307)

## 2021-12-15 LAB — VITAMIN B12: Vitamin B-12: 310 pg/mL (ref 180–914)

## 2021-12-15 NOTE — Progress Notes (Unsigned)
   Patient ID: Jocelyn Wells, female    DOB: 1955-03-19, 66 y.o.   MRN: [id number]  HPI  Jocelyn Wells is a 66 y/o female with a history of  Echo report from 09/29/21 reviewed and showed an EF of 45-50% along with moderate LVH & mild LAE.   RHC/LHC done 03/25/15 and showed: Severe left ventricular systolic dysfunction with EF 25-30% Elevated pulmonary Order wedge pressure and upper normal to mildly elevated LVEDP  Normal coronary arteries.  Severe systolic left ventricular dysfunction with EF 25-30%. Mildly elevated LVEDP and moderate pulmonary capillary wedge elevation.  Was in the ED 12/07/21 due to abnormal labs from PCP office but LWBS.   She presents today for her initial visit with a chief complaint of   Review of Systems    Physical Exam    Assessment & Plan:  1: Chronic heart failure with mildly reduced ejection fraction- - NYHA class - on GDMT of  - saw cardiology (Jocelyn Wells) 10/23/21 - BNP 06/02/15 was 177.0  2: HTN with CKD- - BP - saw PCP Jocelyn Wells) 12/04/21 - BMP 12/15/21 reviewed and showed sodium 137, potassium 5.4, creatinine 2.68 & GFR 19  3: COPD- - saw pulmonology Jocelyn Wells) 12/09/21  4: Leukocytosis- - saw hematology Jocelyn Wells) 12/15/21 - hemoglobin 12/15/21 was 6.7 with a WBC of 12.4

## 2021-12-15 NOTE — Progress Notes (Signed)
New patient evaluation.   

## 2021-12-15 NOTE — Progress Notes (Signed)
Hematology/Oncology Consult note [facility] [phone] [phone]  Patient Care Team: Gwyneth Sprout, FNP as PCP - General (Family Medicine) Gabriel Carina Betsey Holiday, MD as Physician Assistant (Endocrinology) Deboraha Sprang, MD as Consulting Physician (Cardiology) Rainey Pines, MD as Referring Physician (Psychiatry) Ralene Bathe, MD as Consulting Physician (Dermatology) Dimmig, Marcello Moores, MD as Referring Physician (Orthopedic Surgery) Schnier, Dolores Lory, MD (Vascular Surgery) Lynnell Dike, [place] (Optometry) Germaine Pomfret, [facility] (Pharmacist) Tyler Pita, MD as Consulting Physician (Pulmonary Disease)   Name of the patient: Jocelyn Wells  [id number]  Aug 30, 1955    Reason for referral-leukocytosis   Referring physician-Elise payne FNP  Date of visit: 12/15/21   History of presenting illness-patient is a 66 year old female with a past medical history significant for smoking, CKD, hypertension hyperlipidemia, COPD among other medical problems.She has been referred for leukocytosis.  Most recent CBC from 12/07/2021 showed white cell count of 13.9, H&H of 7.2/24.6 with an MCV of 80.9 and a platelet count of 535.  Differential is mainly shown neutrophilia with occasional monocytosis her white cell count at baseline up until July 2023 was around 11 and it dropped down to 8.2 in August 2023.  She has had some borderline microcytosis over the years.  No recent iron studies have been checked.  She has not seen GI recently.  Is any vaginal bleeding.  Denies any blood loss in her stool or urine.  Denies any dark melanotic stools.  Denies any consistent use of NSAIDs  ECOG PS- 2  Pain scale- 3   Review of systems- Review of Systems  Constitutional:  Positive for malaise/fatigue. Negative for chills, fever and weight loss.  HENT:  Negative for congestion, ear discharge and nosebleeds.   Eyes:  Negative for blurred vision.  Respiratory:  Negative  for cough, hemoptysis, sputum production, shortness of breath and wheezing.   Cardiovascular:  Negative for chest pain, palpitations, orthopnea and claudication.  Gastrointestinal:  Negative for abdominal pain, blood in stool, constipation, diarrhea, heartburn, melena, nausea and vomiting.  Genitourinary:  Negative for dysuria, flank pain, frequency, hematuria and urgency.  Musculoskeletal:  Negative for back pain, joint pain and myalgias.  Skin:  Negative for rash.  Neurological:  Negative for dizziness, tingling, focal weakness, seizures, weakness and headaches.  Endo/Heme/Allergies:  Does not bruise/bleed easily.  Psychiatric/Behavioral:  Negative for depression and suicidal ideas. The patient does not have insomnia.     Allergies  Allergen Reactions   Codeine Nausea And Vomiting   Ivp Dye [Iodinated Contrast Media]     Patients states the IV Dye shuts her kidneys down    Patient Active Problem List   Diagnosis Date Noted   Personal history of nicotine dependence 12/04/2021   Leukocytosis 12/04/2021   Long toenail 12/04/2021   Screening for colon cancer 12/04/2021   Depression, recurrent ([place]) 12/04/2021   Hospital discharge follow-up 09/02/2021   Hyperkalemia 08/15/2021   Acute on chronic diastolic CHF (congestive heart failure) ([place]) 08/14/2021   Atherosclerosis of native arteries of the extremities with ulceration ([place]) 08/12/2021   Positive depression screening 07/14/2021   DOE (dyspnea on exertion) 07/14/2021   Bilateral leg edema 07/14/2021   Fall 04/13/2021   Anemia in chronic kidney disease 03/18/2021   Heart failure, unspecified ([place]) 03/18/2021   Hyperparathyroidism due to renal insufficiency ([place]) 03/18/2021   Chronic depression 03/18/2021   Overweight 03/18/2021   Poor appetite 02/13/2021   MDD (major depressive disorder), recurrent episode, mild ([place]) 02/13/2021  Benign hypertensive kidney disease with chronic kidney disease 10/14/2020   CKD (chronic kidney  disease), stage IV ([place]) 05/31/2020   Compression fracture of L1 lumbar vertebra ([place]) 01/30/2019   Compression fracture of body of thoracic vertebra (HCC) 01/30/2019   Lumbar foraminal stenosis (RIGHT L1) 01/30/2019   Spinal stenosis of lumbar region with neurogenic claudication (L4-L5) 01/30/2019   Osteoporosis, post-menopausal 04/14/2018   Vitamin D deficiency 04/14/2018   Chronic deep vein thrombosis (DVT) ([place]) 02/09/2018   Post-menopausal 11/17/2017   Aortic atherosclerosis ([place]) 11/08/2017   Snoring 04/24/2015   Sleep paralysis, recurrent isolated 04/24/2015   Hypersomnia with sleep apnea 04/24/2015   Cataplexy 04/24/2015   Morbid obesity due to excess calories ([place]) 04/24/2015   COPD (chronic obstructive pulmonary disease) ([place]) 25/85/2778   Embolic stroke ([place]) 24/23/5361   Acute renal failure superimposed on stage 4 chronic kidney disease ([place]) 01/29/2015   Dizziness 07/05/2014   Hypertension associated with diabetes ([place]) 07/05/2014   Carbuncle and furuncle 07/05/2014   Pain of perianal area 07/05/2014   Guttate psoriasis 07/05/2014   H/O: osteoarthritis 06/05/2014   Anxiety, generalized 06/05/2014   Microalbuminuria 12/23/2013   Long term current use of insulin ([place]) 12/23/2013   Type 2 diabetes mellitus with other diabetic kidney complication ([place]) 44/31/5400   Disturbance of skin sensation 09/02/2006   Difficulty hearing 07/25/2006   Cephalalgia 05/27/2006   Hyperlipidemia associated with type 2 diabetes mellitus ([place]) 10/22/2005   Arthritis, degenerative 10/22/2005     Past Medical History:  Diagnosis Date   Anxiety    Cardiomyopathy [facility])    new to her Jan 2017   Chronic kidney disease    COPD (chronic obstructive pulmonary disease) ([place])    Depression    Diabetes mellitus, type II ([place])    HTN (hypertension)    Obesity    Osteoporosis    PONV (postoperative nausea and vomiting)    Stroke [facility])    Jan 2017     Past Surgical History:  Procedure  Laterality Date   ABDOMINAL HYSTERECTOMY     ANKLE FRACTURE SURGERY Left 2002   BILATERAL SALPINGOOPHORECTOMY  2000   CARDIAC CATHETERIZATION N/A 03/25/2015   Procedure: Right/Left Heart Cath and Coronary Angiography;  Surgeon: Belva Crome, MD;  Location: [place] CV LAB;  Service: Cardiovascular;  Laterality: N/A;   CESAREAN SECTION  1978   COLONOSCOPY WITH PROPOFOL N/A 09/22/2016   Procedure: COLONOSCOPY WITH PROPOFOL;  Surgeon: Manya Silvas, MD;  Location: [facility] ENDOSCOPY;  Service: Endoscopy;  Laterality: N/A;   EP IMPLANTABLE DEVICE N/A 08/05/2015   Procedure: Loop Recorder Insertion;  Surgeon: Deboraha Sprang, MD;  Location: [place] CV LAB;  Service: Cardiovascular;  Laterality: N/A;   ESOPHAGOGASTRODUODENOSCOPY (EGD) WITH PROPOFOL N/A 09/22/2016   Procedure: ESOPHAGOGASTRODUODENOSCOPY (EGD) WITH PROPOFOL;  Surgeon: Manya Silvas, MD;  Location: [facility] ENDOSCOPY;  Service: Endoscopy;  Laterality: N/A;   ESOPHAGOGASTRODUODENOSCOPY (EGD) WITH PROPOFOL N/A 12/08/2016   Procedure: ESOPHAGOGASTRODUODENOSCOPY (EGD) WITH PROPOFOL;  Surgeon: Manya Silvas, MD;  Location: [facility] ENDOSCOPY;  Service: Endoscopy;  Laterality: N/A;   KNEE ARTHROSCOPY Left 2005   LOWER EXTREMITY ANGIOGRAPHY Left 08/12/2021   Procedure: Lower Extremity Angiography;  Surgeon: Katha Cabal, MD;  Location: [place] CV LAB;  Service: Cardiovascular;  Laterality: Left;   TEE WITHOUT CARDIOVERSION N/A 01/31/2015   Procedure: TRANSESOPHAGEAL ECHOCARDIOGRAM (TEE);  Surgeon: Lelon Perla, MD;  Location: [place];  Service: Cardiovascular;  Laterality: N/A;   TEMPORARY DIALYSIS CATHETER N/A 08/14/2021  Procedure: TEMPORARY DIALYSIS CATHETER;  Surgeon: Katha Cabal, MD;  Location: [place] CV LAB;  Service: Cardiovascular;  Laterality: N/A;   TUBAL LIGATION  1978   ULNAR NERVE TRANSPOSITION  2008    Social History   Socioeconomic History   Marital status: Married    Spouse name: Not  on file   Number of children: 2   Years of education: Not on file   Highest education level: Bachelor's degree (e.g., BA, AB, BS)  Occupational History   Occupation: disabled    Comment: retired  Tobacco Use   Smoking status: Every Day    Packs/day: 0.50    Years: 47.00    Total pack years: 23.50    Types: Cigarettes    Start date: 02/08/1974   Smokeless tobacco: Never   Tobacco comments:    1-3 cigarettes a day. 12/09/2021 khj  Vaping Use   Vaping Use: Never used  Substance and Sexual Activity   Alcohol use: No    Alcohol/week: 0.0 standard drinks of alcohol   Drug use: No   Sexual activity: Yes    Birth control/protection: None  Other Topics Concern   Not on file  Social History Narrative   Lives at home with stepson and husband, dogs and cats   Caffeine  Drinks sweet tea.   Right handed.    Social Determinants of Health   Financial Resource Strain: Low Risk  (12/11/2021)   Overall Financial Resource Strain (CARDIA)    Difficulty of Paying Living Expenses: Not very hard  Food Insecurity: No Food Insecurity (12/11/2021)   Hunger Vital Sign    Worried About Running Out of Food in the Last Year: Never true    Ran Out of Food in the Last Year: Never true  Transportation Needs: No Transportation Needs (12/15/2021)   PRAPARE - Hydrologist (Medical): No    Lack of Transportation (Non-Medical): No  Physical Activity: Inactive (12/11/2021)   Exercise Vital Sign    Days of Exercise per Week: 0 days    Minutes of Exercise per Session: 0 min  Stress: Stress Concern Present (12/11/2021)   [place]    Feeling of Stress : To some extent  Social Connections: Moderately Isolated (12/11/2021)   Social Connection and Isolation Panel [NHANES]    Frequency of Communication with Friends and Family: More than three times a week    Frequency of Social Gatherings with Friends and Family: Once a  week    Attends Religious Services: Never    Marine scientist or Organizations: No    Attends Archivist Meetings: Never    Marital Status: Married  Human resources officer Violence: Not At Risk (05/27/2021)   Humiliation, Afraid, Rape, and Kick questionnaire    Fear of Current or Ex-Partner: No    Emotionally Abused: No    Physically Abused: No    Sexually Abused: No     Family History  Problem Relation Age of Onset   Heart disease Mother        died from CHF   Asthma Mother    Diabetes Mother    Heart disease Father    Aneurysm Father    COPD Brother    Diabetes Brother    Alcohol abuse Paternal Aunt    Cancer Maternal Grandmother        unknownorigin   Anemia Neg Hx    Arrhythmia Neg Hx  Clotting disorder Neg Hx    Fainting Neg Hx    Heart attack Neg Hx    Heart failure Neg Hx    Hyperlipidemia Neg Hx    Hypertension Neg Hx      Current Outpatient Medications:    albuterol (VENTOLIN HFA) 108 (90 Base) MCG/ACT inhaler, TAKE 2 PUFFS BY MOUTH EVERY 6 HOURS AS NEEDED FOR WHEEZE OR SHORTNESS OF BREATH, Disp: 8.5 each, Rfl: 2   apixaban (ELIQUIS) 5 MG TABS tablet, Take 1 tablet (5 mg total) by mouth 2 (two) times daily., Disp: 180 tablet, Rfl: 3   atorvastatin (LIPITOR) 40 MG tablet, Take 1 tablet (40 mg total) by mouth daily., Disp: 90 tablet, Rfl: 3   buPROPion (WELLBUTRIN SR) 150 MG 12 hr tablet, Take 1 tablet (150 mg total) by mouth 2 (two) times daily., Disp: 60 tablet, Rfl: 1   carvedilol (COREG) 12.5 MG tablet, Take 1 tablet (12.5 mg total) by mouth 2 (two) times daily with a meal., Disp: 180 tablet, Rfl: 0   clopidogrel (PLAVIX) 75 MG tablet, Take 1 tablet (75 mg total) by mouth daily., Disp: 90 tablet, Rfl: 3   Dulaglutide 3 MG/0.5ML SOPN, Inject into the skin once a week., Disp: , Rfl:    fluticasone (FLONASE) 50 MCG/ACT nasal spray, Place 2 sprays into both nostrils daily., Disp: 16 g, Rfl: 6   gabapentin (NEURONTIN) 300 MG capsule, Take 300 mg by mouth  2 (two) times daily., Disp: , Rfl:    glucose blood test strip, OneTouch Verio strips, Disp: , Rfl:    insulin aspart (NOVOLOG) 100 UNIT/ML injection, Insulin pump, Disp: , Rfl:    Insulin Disposable Pump (OMNIPOD DASH PODS, GEN 4,) MISC, USE 1 POD EACH EVERY 72    HOURS, Disp: , Rfl:    Insulin Human (INSULIN PUMP) SOLN, Per endocrinoloy, Disp: , Rfl:    loratadine (CLARITIN) 10 MG tablet, Take 1 tablet by mouth daily., Disp: , Rfl:    losartan (COZAAR) 50 MG tablet, Take 1 tablet (50 mg total) by mouth daily., Disp: 90 tablet, Rfl: 0   meclizine (ANTIVERT) 12.5 MG tablet, Take 1 tablet (12.5 mg total) by mouth 3 (three) times daily as needed for dizziness., Disp: 30 tablet, Rfl: 0   pantoprazole (PROTONIX) 40 MG tablet, Take 1 tablet (40 mg total) by mouth 2 (two) times daily., Disp: 60 tablet, Rfl: 0   PARoxetine (PAXIL) 40 MG tablet, Take 1 tablet (40 mg total) by mouth daily., Disp: 90 tablet, Rfl: 1   silver sulfADIAZINE (SILVADENE) 1 % cream, Apply 1 Application topically daily., Disp: 400 g, Rfl: 0   Spacer/Aero-Holding Chambers [facility] DIAMOND) MISC, Use with inhaler to ensure medication is delivered throughout lungs, Disp: 1 each, Rfl: 0   torsemide (DEMADEX) 20 MG tablet, Take 20 mg by mouth daily., Disp: , Rfl:    traMADol (ULTRAM) 50 MG tablet, Take 1 tablet (50 mg total) by mouth every 12 (twelve) hours as needed., Disp: 60 tablet, Rfl: 3   Physical exam:  Vitals:   12/15/21 1500  BP: (!) 143/77  Pulse: 71  Resp: 18  Temp: (!) 96.9 F (36.1 C)  TempSrc: Tympanic  SpO2: 95%  Weight: 245 lb 9.6 oz (111.4 kg)   Physical Exam Constitutional:      General: She is not in acute distress. Cardiovascular:     Rate and Rhythm: Normal rate and regular rhythm.     Heart sounds: Normal heart sounds.  Pulmonary:  Effort: Pulmonary effort is normal.     Breath sounds: Normal breath sounds.  Abdominal:     General: Bowel sounds are normal.     Palpations: Abdomen is soft.   Skin:    General: Skin is warm and dry.  Neurological:     Mental Status: She is alert and oriented to person, place, and time.           Latest Ref Rng & Units 12/07/2021    5:44 PM  CMP  Glucose 70 - 99 mg/dL 136   BUN 8 - 23 mg/dL 41   Creatinine 0.44 - 1.00 mg/dL 2.25   Sodium 135 - 145 mmol/L 139   Potassium 3.5 - 5.1 mmol/L 5.1   Chloride 98 - 111 mmol/L 111   CO2 22 - 32 mmol/L 21   Calcium 8.9 - 10.3 mg/dL 6.6   Total Protein 6.5 - 8.1 g/dL 6.8   Total Bilirubin 0.3 - 1.2 mg/dL 0.5   Alkaline Phos 38 - 126 U/L 92   AST 15 - 41 U/L 12   ALT 0 - 44 U/L 10       Latest Ref Rng & Units 12/07/2021    5:44 PM  CBC  WBC 4.0 - 10.5 K/uL 13.9   Hemoglobin 12.0 - 15.0 g/dL 7.2   Hematocrit 36.0 - 46.0 % 24.6   Platelets 150 - 400 K/uL 535     No images are attached to the encounter.  DG Foot Complete Right  Result Date: 11/24/2021 CLINICAL DATA:  Fall with foot pain EXAM: RIGHT FOOT COMPLETE - 3+ VIEW COMPARISON:  None Available. FINDINGS: No malalignment. Possible nondisplaced fracture at the right fifth metatarsal neck. Mild degenerative change at the first MTP joint. Small plantar calcaneal spur IMPRESSION: Possible nondisplaced cortical fracture at the fifth metatarsal neck, correlate for point tenderness. Electronically Signed   By: Donavan Foil M.D.   On: 11/24/2021 19:17    Assessment and plan- Patient is a 66 y.o. female referred for leukocytosis  Patient has leukocytosis and thrombocytosis which is likely reactive.  Differential mainly shows neutrophilia.  However what is most striking is her anemia and her most recent hemoglobin was 7.2 with an MCV of 80.9 suggestive of iron deficiency.  I will check CBC with differential ferritin and iron studies B12 folate reticulocyte count haptoglobin myeloma panel LDH and serum free light chains today.  I will see her back in 2 to 3 weeks time for in person or video visit.  If she has evidence of iron deficiency she  will need to see GI ASAP.  We will also clarify with vascular surgery if she needs to continue Eliquis given her concerning findings of anemia she is on Eliquis for her peripheral vascular disease.  Patient also has chronic kidneyDisease which may be contributing to her anemia to some extent.  However until her iron stores are normalized there would be no role for EPO at this time.  If she has evidence of iron deficiency patient will also benefit from IV iron.  Discussed risks and benefits of IV iron including all but not limited to possible risk of infusion and anaphylactic reaction.  Patient understands and agrees to proceed as planned   Thank you for this kind referral and the opportunity to participate in the care of this  Patient   Visit Diagnosis No diagnosis found.  Dr. Randa Evens, MD, MPH [facility] [phone] 12/15/2021

## 2021-12-16 ENCOUNTER — Telehealth: Payer: Self-pay | Admitting: Oncology

## 2021-12-16 ENCOUNTER — Ambulatory Visit: Payer: Medicare Other | Attending: Family | Admitting: Family

## 2021-12-16 ENCOUNTER — Encounter: Payer: Self-pay | Admitting: Pharmacist

## 2021-12-16 ENCOUNTER — Other Ambulatory Visit: Payer: Self-pay | Admitting: *Deleted

## 2021-12-16 ENCOUNTER — Telehealth: Payer: Self-pay

## 2021-12-16 ENCOUNTER — Encounter: Payer: Self-pay | Admitting: Family

## 2021-12-16 VITALS — BP 107/39 | HR 77 | Resp 18 | Ht 66.0 in | Wt 248.2 lb

## 2021-12-16 DIAGNOSIS — I5022 Chronic systolic (congestive) heart failure: Secondary | ICD-10-CM | POA: Diagnosis not present

## 2021-12-16 DIAGNOSIS — D72829 Elevated white blood cell count, unspecified: Secondary | ICD-10-CM | POA: Diagnosis not present

## 2021-12-16 DIAGNOSIS — N184 Chronic kidney disease, stage 4 (severe): Secondary | ICD-10-CM

## 2021-12-16 DIAGNOSIS — Z8673 Personal history of transient ischemic attack (TIA), and cerebral infarction without residual deficits: Secondary | ICD-10-CM | POA: Insufficient documentation

## 2021-12-16 DIAGNOSIS — J449 Chronic obstructive pulmonary disease, unspecified: Secondary | ICD-10-CM

## 2021-12-16 DIAGNOSIS — I7025 Atherosclerosis of native arteries of other extremities with ulceration: Secondary | ICD-10-CM | POA: Diagnosis not present

## 2021-12-16 DIAGNOSIS — E1165 Type 2 diabetes mellitus with hyperglycemia: Secondary | ICD-10-CM | POA: Diagnosis not present

## 2021-12-16 DIAGNOSIS — I509 Heart failure, unspecified: Secondary | ICD-10-CM | POA: Insufficient documentation

## 2021-12-16 DIAGNOSIS — I130 Hypertensive heart and chronic kidney disease with heart failure and stage 1 through stage 4 chronic kidney disease, or unspecified chronic kidney disease: Secondary | ICD-10-CM | POA: Diagnosis not present

## 2021-12-16 DIAGNOSIS — I10 Essential (primary) hypertension: Secondary | ICD-10-CM

## 2021-12-16 DIAGNOSIS — E1122 Type 2 diabetes mellitus with diabetic chronic kidney disease: Secondary | ICD-10-CM | POA: Diagnosis not present

## 2021-12-16 DIAGNOSIS — D631 Anemia in chronic kidney disease: Secondary | ICD-10-CM

## 2021-12-16 DIAGNOSIS — F32A Depression, unspecified: Secondary | ICD-10-CM | POA: Diagnosis not present

## 2021-12-16 DIAGNOSIS — M810 Age-related osteoporosis without current pathological fracture: Secondary | ICD-10-CM | POA: Diagnosis not present

## 2021-12-16 DIAGNOSIS — F419 Anxiety disorder, unspecified: Secondary | ICD-10-CM | POA: Diagnosis not present

## 2021-12-16 DIAGNOSIS — D649 Anemia, unspecified: Secondary | ICD-10-CM | POA: Diagnosis not present

## 2021-12-16 DIAGNOSIS — Z720 Tobacco use: Secondary | ICD-10-CM | POA: Diagnosis not present

## 2021-12-16 DIAGNOSIS — D509 Iron deficiency anemia, unspecified: Secondary | ICD-10-CM

## 2021-12-16 DIAGNOSIS — N189 Chronic kidney disease, unspecified: Secondary | ICD-10-CM | POA: Diagnosis not present

## 2021-12-16 LAB — KAPPA/LAMBDA LIGHT CHAINS
Kappa free light chain: 72.2 mg/L — ABNORMAL HIGH (ref 3.3–19.4)
Kappa, lambda light chain ratio: 1.49 (ref 0.26–1.65)
Lambda free light chains: 48.3 mg/L — ABNORMAL HIGH (ref 5.7–26.3)

## 2021-12-16 NOTE — Patient Instructions (Addendum)
Continue weighing daily and call for an overnight weight gain of 3 pounds or more or a weekly weight gain of more than 5 pounds.   If you have voicemail, please make sure your mailbox is cleaned out so that we may leave a message and please make sure to listen to any voicemails.    Decrease your carvedilol to 1/2 tablet in the morning and 1/2 tablet in the evening.    Elevate your legs when sitting for long periods of time

## 2021-12-16 NOTE — Telephone Encounter (Signed)
Called Trish and added EGD to colonoscopy for Iron def anemia. Send procedure clearance and blood thinner clearance to vascular

## 2021-12-16 NOTE — Telephone Encounter (Signed)
-----   Message from Sindy Guadeloupe, MD sent at 12/15/2021  3:48 PM EST ----- We need to give her 1 unit of prbc sometime this week  DR. Vanga- you are doing colonoscopy in 3 weeks. Not sure if it can be moved up. She will also likely need upper. FYI

## 2021-12-16 NOTE — Telephone Encounter (Signed)
Left VM with patient and requested she call back in order to get her set up for labs + 1 unit of prbc.

## 2021-12-16 NOTE — Telephone Encounter (Signed)
-----   Message from Lin Landsman, MD sent at 12/15/2021  4:50 PM EST ----- Looks like she was a direct referral for surveillance colonoscopy Can do an EGD along with colonoscopy for iron deficiency anemia  Caryl Pina, I see that she had clearance to stop Plavix from cardiac standpoint but I do not see the clearance from vascular surgeon to hold Eliquis as well.  Please see the preop clearance note from cardiology.  They did not comment on Eliquis  We have to reach out to Dr. Ella Jubilee about Eliquis   Thanks  Rohini Vanga ----- Message ----- From: Sindy Guadeloupe, MD Sent: 12/15/2021   3:48 PM EST To: Lin Landsman, MD; Rochele Raring; #  We need to give her 1 unit of prbc sometime this week  DR. Vanga- you are doing colonoscopy in 3 weeks. Not sure if it can be moved up. She will also likely need upper. FYI

## 2021-12-17 ENCOUNTER — Inpatient Hospital Stay: Payer: Medicare Other

## 2021-12-17 ENCOUNTER — Other Ambulatory Visit: Payer: Self-pay | Admitting: *Deleted

## 2021-12-17 ENCOUNTER — Telehealth: Payer: Self-pay

## 2021-12-17 ENCOUNTER — Other Ambulatory Visit: Payer: Self-pay | Admitting: Oncology

## 2021-12-17 DIAGNOSIS — I129 Hypertensive chronic kidney disease with stage 1 through stage 4 chronic kidney disease, or unspecified chronic kidney disease: Secondary | ICD-10-CM | POA: Diagnosis not present

## 2021-12-17 DIAGNOSIS — E1151 Type 2 diabetes mellitus with diabetic peripheral angiopathy without gangrene: Secondary | ICD-10-CM | POA: Diagnosis not present

## 2021-12-17 DIAGNOSIS — D631 Anemia in chronic kidney disease: Secondary | ICD-10-CM | POA: Diagnosis not present

## 2021-12-17 DIAGNOSIS — I509 Heart failure, unspecified: Secondary | ICD-10-CM | POA: Diagnosis not present

## 2021-12-17 DIAGNOSIS — D649 Anemia, unspecified: Secondary | ICD-10-CM | POA: Diagnosis not present

## 2021-12-17 DIAGNOSIS — N2581 Secondary hyperparathyroidism of renal origin: Secondary | ICD-10-CM | POA: Diagnosis not present

## 2021-12-17 DIAGNOSIS — F32A Depression, unspecified: Secondary | ICD-10-CM | POA: Diagnosis not present

## 2021-12-17 DIAGNOSIS — E663 Overweight: Secondary | ICD-10-CM | POA: Diagnosis not present

## 2021-12-17 DIAGNOSIS — I639 Cerebral infarction, unspecified: Secondary | ICD-10-CM | POA: Diagnosis not present

## 2021-12-17 DIAGNOSIS — D75839 Thrombocytosis, unspecified: Secondary | ICD-10-CM | POA: Diagnosis not present

## 2021-12-17 DIAGNOSIS — D508 Other iron deficiency anemias: Secondary | ICD-10-CM

## 2021-12-17 DIAGNOSIS — D509 Iron deficiency anemia, unspecified: Secondary | ICD-10-CM

## 2021-12-17 DIAGNOSIS — R809 Proteinuria, unspecified: Secondary | ICD-10-CM | POA: Diagnosis not present

## 2021-12-17 DIAGNOSIS — D72829 Elevated white blood cell count, unspecified: Secondary | ICD-10-CM | POA: Diagnosis not present

## 2021-12-17 DIAGNOSIS — E1122 Type 2 diabetes mellitus with diabetic chronic kidney disease: Secondary | ICD-10-CM | POA: Diagnosis not present

## 2021-12-17 DIAGNOSIS — I70248 Atherosclerosis of native arteries of left leg with ulceration of other part of lower left leg: Secondary | ICD-10-CM | POA: Diagnosis not present

## 2021-12-17 DIAGNOSIS — I110 Hypertensive heart disease with heart failure: Secondary | ICD-10-CM | POA: Diagnosis not present

## 2021-12-17 DIAGNOSIS — I10 Essential (primary) hypertension: Secondary | ICD-10-CM | POA: Diagnosis not present

## 2021-12-17 DIAGNOSIS — J449 Chronic obstructive pulmonary disease, unspecified: Secondary | ICD-10-CM | POA: Diagnosis not present

## 2021-12-17 DIAGNOSIS — U071 COVID-19: Secondary | ICD-10-CM | POA: Diagnosis not present

## 2021-12-17 DIAGNOSIS — N184 Chronic kidney disease, stage 4 (severe): Secondary | ICD-10-CM | POA: Diagnosis not present

## 2021-12-17 DIAGNOSIS — I5032 Chronic diastolic (congestive) heart failure: Secondary | ICD-10-CM | POA: Diagnosis not present

## 2021-12-17 DIAGNOSIS — N189 Chronic kidney disease, unspecified: Secondary | ICD-10-CM | POA: Diagnosis not present

## 2021-12-17 LAB — ABO/RH: ABO/RH(D): A POS

## 2021-12-17 LAB — PREPARE RBC (CROSSMATCH)

## 2021-12-17 LAB — HAPTOGLOBIN: Haptoglobin: 192 mg/dL (ref 37–355)

## 2021-12-17 MED FILL — Iron Sucrose Inj 20 MG/ML (Fe Equiv): INTRAVENOUS | Qty: 10 | Status: AC

## 2021-12-17 NOTE — Telephone Encounter (Signed)
Contacted pt and made her aware of her lab appt today at 1pm in order to have a hold tube for her blood transfussion tomorrow. Pt states she will be here for appt and has never recieved blood in the past.

## 2021-12-17 NOTE — Telephone Encounter (Signed)
Copied from [place] [id number]. Topic: General - Other >> Dec 17, 2021  3:55 PM [name] B wrote: Requesting verbal orders for 2x for 9 weeks for wound care for left leg.

## 2021-12-17 NOTE — Progress Notes (Signed)
Patient ID: Jocelyn Wells, female   DOB: Mar 22, 1955, 66 y.o.   MRN: [id number] [place] COUNSELING NOTE  Guideline-Directed Medical Therapy/Evidence Based Medicine  ACE/ARB/ARNI: Losartan 50 mg daily Beta Blocker: Carvedilol 12.5 mg twice daily Aldosterone Antagonist:  none Diuretic: Torsemide 20 mg daily SGLT2i:  none  GLP1: Dulaglutide every Monday  Adherence Assessment  Do you ever forget to take your medication? '[]'$ Yes '[x]'$ No  Do you ever skip doses due to side effects? '[]'$ Yes '[x]'$ No  Do you have trouble affording your medicines? '[]'$ Yes '[x]'$ No  Are you ever unable to pick up your medication due to transportation difficulties? '[]'$ Yes '[x]'$ No  Do you ever stop taking your medications because you don't believe they are helping? '[]'$ Yes '[x]'$ No  Do you check your weight daily? '[x]'$ Yes '[]'$ No   Adherence strategy: none  Barriers to obtaining medications: none  Vital signs: HR 77, BP 107/39, weight (pounds) 248 lbs ECHO: Date 09/29/21, EF 45-50%, notes  moderate LVH & mild LAE  Cath: Date 03/25/15, EF 25-30%     Latest Ref Rng & Units 12/15/2021    3:28 PM 12/07/2021    5:44 PM 12/04/2021    3:42 PM  BMP  Glucose 70 - 99 mg/dL 133  136  327   BUN 8 - 23 mg/dL 38  41  38   Creatinine 0.44 - 1.00 mg/dL 2.68  2.25  2.51   BUN/Creat Ratio 12 - 28   15   Sodium 135 - 145 mmol/L 137  139  140   Potassium 3.5 - 5.1 mmol/L 5.4  5.1  6.3   Chloride 98 - 111 mmol/L 109  111  107   CO2 22 - 32 mmol/L '22  21  18   '$ Calcium 8.9 - 10.3 mg/dL 6.8  6.6  6.0     Past Medical History:  Diagnosis Date   Anemia    Anxiety    Cardiomyopathy ([place])    new to her Jan 2017   CHF (congestive heart failure) (HCC)    Chronic kidney disease    COPD (chronic obstructive pulmonary disease) (HCC)    Depression    Diabetes mellitus, type II (HCC)    HTN (hypertension)    Leukocytosis    Obesity    Osteoporosis    PONV (postoperative nausea and  vomiting)    Stroke [facility])    Jan 2017    ASSESSMENT 66 year old female who presents to the HF clinic for initial visit. PMH includes  DM, HTN, CKD, stroke, anemia, anxiety, COPD, depression, leukocytosis, tobacco use and HFimpEF.  Noted acute care admission on 08/2021 for AKI.   Patient sick in appearance and using walker. Reports feeling "okay". Denies increased swelling, but report increased SOB. Noted Hgb < 7, currently on Eliquis and following with hematology.   PLAN Decrease carvedilol dose to 6.'25mg'$  BID for now (low BP) Continue all other medication as prescribed Noted hematology trying to contact patient to discuss results and for PRBCs transfusion orders. Patient notified by NP during appointment. Follow up as directed by provider   Time spent: 15 minutes  Cheril Slattery Rodriguez-Guzman PharmD, BCPS 12/17/2021 3:05 PM   Current Outpatient Medications:    albuterol (VENTOLIN HFA) 108 (90 Base) MCG/ACT inhaler, TAKE 2 PUFFS BY MOUTH EVERY 6 HOURS AS NEEDED FOR WHEEZE OR SHORTNESS OF BREATH, Disp: 8.5 each, Rfl: 2   apixaban (ELIQUIS) 5 MG TABS tablet, Take 1 tablet (5 mg total) by mouth  2 (two) times daily., Disp: 180 tablet, Rfl: 3   atorvastatin (LIPITOR) 40 MG tablet, Take 1 tablet (40 mg total) by mouth daily., Disp: 90 tablet, Rfl: 3   buPROPion (WELLBUTRIN SR) 150 MG 12 hr tablet, Take 1 tablet (150 mg total) by mouth 2 (two) times daily., Disp: 60 tablet, Rfl: 1   carvedilol (COREG) 12.5 MG tablet, Take 1 tablet (12.5 mg total) by mouth 2 (two) times daily with a meal. (Patient taking differently: Take 6.25 mg by mouth 2 (two) times daily with a meal.), Disp: 180 tablet, Rfl: 0   clopidogrel (PLAVIX) 75 MG tablet, Take 1 tablet (75 mg total) by mouth daily., Disp: 90 tablet, Rfl: 3   Dulaglutide 3 MG/0.5ML SOPN, Inject into the skin once a week., Disp: , Rfl:    fluticasone (FLONASE) 50 MCG/ACT nasal spray, Place 2 sprays into both nostrils daily., Disp: 16 g, Rfl: 6   gabapentin  (NEURONTIN) 300 MG capsule, Take 300 mg by mouth 2 (two) times daily., Disp: , Rfl:    glucose blood test strip, OneTouch Verio strips, Disp: , Rfl:    insulin aspart (NOVOLOG) 100 UNIT/ML injection, Insulin pump, Disp: , Rfl:    Insulin Disposable Pump (OMNIPOD DASH PODS, GEN 4,) MISC, USE 1 POD EACH EVERY 72    HOURS, Disp: , Rfl:    Insulin Human (INSULIN PUMP) SOLN, Per endocrinoloy, Disp: , Rfl:    loratadine (CLARITIN) 10 MG tablet, Take 1 tablet by mouth daily., Disp: , Rfl:    losartan (COZAAR) 50 MG tablet, Take 1 tablet (50 mg total) by mouth daily., Disp: 90 tablet, Rfl: 0   meclizine (ANTIVERT) 12.5 MG tablet, Take 1 tablet (12.5 mg total) by mouth 3 (three) times daily as needed for dizziness. (Patient not taking: Reported on 12/16/2021), Disp: 30 tablet, Rfl: 0   pantoprazole (PROTONIX) 40 MG tablet, Take 1 tablet (40 mg total) by mouth 2 (two) times daily., Disp: 60 tablet, Rfl: 0   PARoxetine (PAXIL) 40 MG tablet, Take 1 tablet (40 mg total) by mouth daily., Disp: 90 tablet, Rfl: 1   silver sulfADIAZINE (SILVADENE) 1 % cream, Apply 1 Application topically daily., Disp: 400 g, Rfl: 0   Spacer/Aero-Holding Chambers [facility] DIAMOND) MISC, Use with inhaler to ensure medication is delivered throughout lungs, Disp: 1 each, Rfl: 0   torsemide (DEMADEX) 20 MG tablet, Take 20 mg by mouth daily., Disp: , Rfl:    traMADol (ULTRAM) 50 MG tablet, Take 1 tablet (50 mg total) by mouth every 12 (twelve) hours as needed., Disp: 60 tablet, Rfl: 3   MEDICATION ADHERENCES TIPS AND STRATEGIES Taking medication as prescribed improves patient outcomes in heart failure (reduces hospitalizations, improves symptoms, increases survival) Side effects of medications can be managed by decreasing doses, switching agents, stopping drugs, or adding additional therapy. Please let someone in the [facility] know if you have having bothersome side effects so we can modify your regimen. Do not alter your  medication regimen without talking to [place].  Medication reminders can help patients remember to take drugs on time. If you are missing or forgetting doses you can try linking behaviors, using pill boxes, or an electronic reminder like an alarm on your phone or an app. Some people can also get automated phone calls as medication reminders.

## 2021-12-18 ENCOUNTER — Ambulatory Visit: Payer: Medicare Other | Admitting: Podiatry

## 2021-12-18 ENCOUNTER — Inpatient Hospital Stay: Payer: Medicare Other

## 2021-12-18 DIAGNOSIS — E1165 Type 2 diabetes mellitus with hyperglycemia: Secondary | ICD-10-CM | POA: Diagnosis not present

## 2021-12-18 DIAGNOSIS — D72829 Elevated white blood cell count, unspecified: Secondary | ICD-10-CM | POA: Diagnosis not present

## 2021-12-18 DIAGNOSIS — N189 Chronic kidney disease, unspecified: Secondary | ICD-10-CM | POA: Diagnosis not present

## 2021-12-18 DIAGNOSIS — I129 Hypertensive chronic kidney disease with stage 1 through stage 4 chronic kidney disease, or unspecified chronic kidney disease: Secondary | ICD-10-CM | POA: Diagnosis not present

## 2021-12-18 DIAGNOSIS — D508 Other iron deficiency anemias: Secondary | ICD-10-CM

## 2021-12-18 DIAGNOSIS — D649 Anemia, unspecified: Secondary | ICD-10-CM | POA: Diagnosis not present

## 2021-12-18 DIAGNOSIS — J449 Chronic obstructive pulmonary disease, unspecified: Secondary | ICD-10-CM | POA: Diagnosis not present

## 2021-12-18 DIAGNOSIS — D75839 Thrombocytosis, unspecified: Secondary | ICD-10-CM | POA: Diagnosis not present

## 2021-12-18 MED ORDER — SODIUM CHLORIDE 0.9% IV SOLUTION
250.0000 mL | Freq: Once | INTRAVENOUS | Status: AC
  Administered 2021-12-18: 250 mL via INTRAVENOUS
  Filled 2021-12-18: qty 250

## 2021-12-18 NOTE — Patient Instructions (Signed)

## 2021-12-19 LAB — BPAM RBC
Blood Product Expiration Date: 202312062359
ISSUE DATE / TIME: 202311100926
Unit Type and Rh: 6200

## 2021-12-19 LAB — TYPE AND SCREEN
ABO/RH(D): A POS
Antibody Screen: NEGATIVE
Unit division: 0

## 2021-12-21 ENCOUNTER — Ambulatory Visit: Payer: Self-pay

## 2021-12-21 DIAGNOSIS — N184 Chronic kidney disease, stage 4 (severe): Secondary | ICD-10-CM | POA: Diagnosis not present

## 2021-12-21 DIAGNOSIS — I70248 Atherosclerosis of native arteries of left leg with ulceration of other part of lower left leg: Secondary | ICD-10-CM | POA: Diagnosis not present

## 2021-12-21 DIAGNOSIS — E1122 Type 2 diabetes mellitus with diabetic chronic kidney disease: Secondary | ICD-10-CM | POA: Diagnosis not present

## 2021-12-21 DIAGNOSIS — I110 Hypertensive heart disease with heart failure: Secondary | ICD-10-CM | POA: Diagnosis not present

## 2021-12-21 DIAGNOSIS — E1151 Type 2 diabetes mellitus with diabetic peripheral angiopathy without gangrene: Secondary | ICD-10-CM | POA: Diagnosis not present

## 2021-12-21 DIAGNOSIS — I5032 Chronic diastolic (congestive) heart failure: Secondary | ICD-10-CM | POA: Diagnosis not present

## 2021-12-21 LAB — MULTIPLE MYELOMA PANEL, SERUM
Albumin SerPl Elph-Mcnc: 3.1 g/dL (ref 2.9–4.4)
Albumin/Glob SerPl: 1.2 (ref 0.7–1.7)
Alpha 1: 0.2 g/dL (ref 0.0–0.4)
Alpha2 Glob SerPl Elph-Mcnc: 0.7 g/dL (ref 0.4–1.0)
B-Globulin SerPl Elph-Mcnc: 0.8 g/dL (ref 0.7–1.3)
Gamma Glob SerPl Elph-Mcnc: 0.8 g/dL (ref 0.4–1.8)
Globulin, Total: 2.6 g/dL (ref 2.2–3.9)
IgA: 293 mg/dL (ref 87–352)
IgG (Immunoglobin G), Serum: 822 mg/dL (ref 586–1602)
IgM (Immunoglobulin M), Srm: 21 mg/dL — ABNORMAL LOW (ref 26–217)
Total Protein ELP: 5.7 g/dL — ABNORMAL LOW (ref 6.0–8.5)

## 2021-12-21 NOTE — Telephone Encounter (Signed)
  Chief Complaint: knee injury Symptoms: R knee swelling, pain 10/10, bruising Frequency: Friday Pertinent Negatives: Patient states unable to apply full weight to RLE Disposition: '[]'$ ED /'[]'$ Urgent Care (no appt availability in office) / '[x]'$ Appointment(In office/virtual)/ '[]'$  [facility]/ '[]'$ Home Care/ '[]'$ Refused Recommended Disposition /'[]'$  Mobile Bus/ '[]'$  Follow-up with PCP Additional Notes: Malachy Mood, pt's [facility] aide calling to report pt falls x2 on 12/18/21. Pt unsure how she fell but hurt R knee. Scheduled appt for tomorrow at [place] no appts today. Malachy Mood asking if pt can be re-eval for PT again d/t frequent falls. Advised would send to provider for review.   Reason for Disposition  Large swelling or bruise (> 2 inches or 5 cm)  Answer Assessment - Initial Assessment Questions 2. ONSET: "When did the injury happen?" (Minutes or hours ago)      12/18/21 3. LOCATION: "Where is the injury located?"      R knee  5. SEVERITY: "Can you put weight on that leg?" "Can you walk?"      no 6. SIZE: For cuts, bruises, or swelling, ask: "How large is it?" (e.g., inches or centimeters;  entire joint)      Swelling and purple 7. PAIN: "Is there pain?" If Yes, ask: "How bad is the pain?"  "What does it keep you from doing?" (e.g., Scale 1-10; or mild, moderate, severe)   -  NONE: (0): no pain   -  MILD (1-3): doesn't interfere with normal activities    -  MODERATE (4-7): interferes with normal activities (e.g., work or school) or awakens from sleep, limping    -  SEVERE (8-10): excruciating pain, unable to do any normal activities, unable to walk     10  Protocols used: Knee Injury-A-AH

## 2021-12-22 ENCOUNTER — Telehealth: Payer: Self-pay | Admitting: Pulmonary Disease

## 2021-12-22 ENCOUNTER — Ambulatory Visit
Admission: RE | Admit: 2021-12-22 | Discharge: 2021-12-22 | Disposition: A | Discharge status: Home / Self Care | Payer: Medicare Other | Attending: Family Medicine | Admitting: Family Medicine

## 2021-12-22 ENCOUNTER — Ambulatory Visit (INDEPENDENT_AMBULATORY_CARE_PROVIDER_SITE_OTHER): Payer: Medicare Other | Admitting: Family Medicine

## 2021-12-22 ENCOUNTER — Ambulatory Visit: Payer: Medicare Other | Admitting: Podiatry

## 2021-12-22 ENCOUNTER — Ambulatory Visit
Admission: RE | Admit: 2021-12-22 | Discharge: 2021-12-22 | Disposition: A | Discharge status: Home / Self Care | Payer: Medicare Other | Source: Ambulatory Visit | Attending: Family Medicine | Admitting: Family Medicine

## 2021-12-22 ENCOUNTER — Encounter: Payer: Self-pay | Admitting: Family Medicine

## 2021-12-22 VITALS — BP 126/65 | HR 77 | Temp 97.6°F | Resp 19 | Wt 249.0 lb

## 2021-12-22 DIAGNOSIS — M810 Age-related osteoporosis without current pathological fracture: Secondary | ICD-10-CM | POA: Diagnosis not present

## 2021-12-22 DIAGNOSIS — M79671 Pain in right foot: Secondary | ICD-10-CM

## 2021-12-22 DIAGNOSIS — Z79891 Long term (current) use of opiate analgesic: Secondary | ICD-10-CM | POA: Diagnosis not present

## 2021-12-22 DIAGNOSIS — M1711 Unilateral primary osteoarthritis, right knee: Secondary | ICD-10-CM | POA: Insufficient documentation

## 2021-12-22 DIAGNOSIS — Z7985 Long-term (current) use of injectable non-insulin antidiabetic drugs: Secondary | ICD-10-CM | POA: Diagnosis not present

## 2021-12-22 DIAGNOSIS — E1122 Type 2 diabetes mellitus with diabetic chronic kidney disease: Secondary | ICD-10-CM | POA: Diagnosis not present

## 2021-12-22 DIAGNOSIS — Z9181 History of falling: Secondary | ICD-10-CM | POA: Diagnosis not present

## 2021-12-22 DIAGNOSIS — Z86718 Personal history of other venous thrombosis and embolism: Secondary | ICD-10-CM | POA: Diagnosis not present

## 2021-12-22 DIAGNOSIS — M25461 Effusion, right knee: Secondary | ICD-10-CM | POA: Insufficient documentation

## 2021-12-22 DIAGNOSIS — W19XXXA Unspecified fall, initial encounter: Secondary | ICD-10-CM | POA: Diagnosis not present

## 2021-12-22 DIAGNOSIS — M7989 Other specified soft tissue disorders: Secondary | ICD-10-CM | POA: Diagnosis not present

## 2021-12-22 DIAGNOSIS — I5032 Chronic diastolic (congestive) heart failure: Secondary | ICD-10-CM | POA: Diagnosis not present

## 2021-12-22 DIAGNOSIS — S8001XA Contusion of right knee, initial encounter: Secondary | ICD-10-CM | POA: Insufficient documentation

## 2021-12-22 DIAGNOSIS — Z8673 Personal history of transient ischemic attack (TIA), and cerebral infarction without residual deficits: Secondary | ICD-10-CM | POA: Diagnosis not present

## 2021-12-22 DIAGNOSIS — I70201 Unspecified atherosclerosis of native arteries of extremities, right leg: Secondary | ICD-10-CM | POA: Diagnosis not present

## 2021-12-22 DIAGNOSIS — Z87891 Personal history of nicotine dependence: Secondary | ICD-10-CM | POA: Diagnosis not present

## 2021-12-22 DIAGNOSIS — I480 Paroxysmal atrial fibrillation: Secondary | ICD-10-CM | POA: Diagnosis not present

## 2021-12-22 DIAGNOSIS — M79604 Pain in right leg: Secondary | ICD-10-CM | POA: Insufficient documentation

## 2021-12-22 DIAGNOSIS — Z6835 Body mass index (BMI) 35.0-35.9, adult: Secondary | ICD-10-CM | POA: Diagnosis not present

## 2021-12-22 DIAGNOSIS — M79661 Pain in right lower leg: Secondary | ICD-10-CM | POA: Diagnosis not present

## 2021-12-22 DIAGNOSIS — I110 Hypertensive heart disease with heart failure: Secondary | ICD-10-CM | POA: Diagnosis not present

## 2021-12-22 DIAGNOSIS — R296 Repeated falls: Secondary | ICD-10-CM | POA: Diagnosis not present

## 2021-12-22 DIAGNOSIS — L97828 Non-pressure chronic ulcer of other part of left lower leg with other specified severity: Secondary | ICD-10-CM | POA: Diagnosis not present

## 2021-12-22 DIAGNOSIS — D500 Iron deficiency anemia secondary to blood loss (chronic): Secondary | ICD-10-CM

## 2021-12-22 DIAGNOSIS — M48061 Spinal stenosis, lumbar region without neurogenic claudication: Secondary | ICD-10-CM | POA: Diagnosis not present

## 2021-12-22 DIAGNOSIS — Z794 Long term (current) use of insulin: Secondary | ICD-10-CM | POA: Diagnosis not present

## 2021-12-22 DIAGNOSIS — Z95820 Peripheral vascular angioplasty status with implants and grafts: Secondary | ICD-10-CM | POA: Diagnosis not present

## 2021-12-22 DIAGNOSIS — Z7902 Long term (current) use of antithrombotics/antiplatelets: Secondary | ICD-10-CM | POA: Diagnosis not present

## 2021-12-22 DIAGNOSIS — E1151 Type 2 diabetes mellitus with diabetic peripheral angiopathy without gangrene: Secondary | ICD-10-CM | POA: Diagnosis not present

## 2021-12-22 DIAGNOSIS — Z7901 Long term (current) use of anticoagulants: Secondary | ICD-10-CM | POA: Diagnosis not present

## 2021-12-22 DIAGNOSIS — N184 Chronic kidney disease, stage 4 (severe): Secondary | ICD-10-CM | POA: Diagnosis not present

## 2021-12-22 DIAGNOSIS — Z79899 Other long term (current) drug therapy: Secondary | ICD-10-CM | POA: Diagnosis not present

## 2021-12-22 DIAGNOSIS — M25561 Pain in right knee: Secondary | ICD-10-CM | POA: Diagnosis not present

## 2021-12-22 DIAGNOSIS — I70248 Atherosclerosis of native arteries of left leg with ulceration of other part of lower left leg: Secondary | ICD-10-CM | POA: Diagnosis not present

## 2021-12-22 DIAGNOSIS — F419 Anxiety disorder, unspecified: Secondary | ICD-10-CM | POA: Diagnosis not present

## 2021-12-22 DIAGNOSIS — J449 Chronic obstructive pulmonary disease, unspecified: Secondary | ICD-10-CM | POA: Diagnosis not present

## 2021-12-22 NOTE — Telephone Encounter (Signed)
CMN has been placed in Dr. Domingo Dimes folder for signature.   Dr. Patsey Berthold, please advise. thanks

## 2021-12-22 NOTE — Progress Notes (Signed)
    SUBJECTIVE:   CHIEF COMPLAINT / HPI:   KNEE PAIN Duration: days Involved knee: right Mechanism of injury: fall x2 on 11/10. Fell while getting into car.  Location:anterior Onset: sudden Severity: 10/10  Quality:  throbbing Frequency: constant Radiation: no Aggravating factors: weight bearing, walking, and bending  Alleviating factors: rest  Status: stable Treatments attempted: rest, tramadol  Relief with NSAIDs?:  No NSAIDs Taken Weakness with weight bearing or walking: yes Sensation of giving way:  sometimes Locking: no Popping: no Bruising: yes Swelling: yes Redness: yes Paresthesias/decreased sensation: no Fevers: no  Did not get boot.  Is not currently weighing at home.   Frequent falls - 2 falls at home 11/10. One in living room, doesn't remember inciting event but denies any preceding symptoms or LOC. Second fall was when getting into car and handing husband her walker. Unsure if she lost her balance.  - h/o DM, fasting CBG 93 this morning.  - undergoing anemia w/u with Heme/Onc, GI. Last Hb 6.7, WBC 12.4. Had 1 pRBC infusion on 11/10. Has colonoscopy/EGD scheduled 11/27. Has Endo f/u in 2 days.  - some blood in stool few months ago, BRBPR. Denies current blood in stool, dark tarry stools or vaginal bleeding.   OBJECTIVE:   BP 126/65 (BP Location: Right Arm, Patient Position: Sitting, Cuff Size: Large)   Pulse 77   Temp 97.6 F (36.4 C) (Temporal)   Resp 19   Wt 249 lb (112.9 kg)   SpO2 93%   BMI 40.19 kg/m   Gen: well appearing, in NAD MSK: 4/5 LLE strength, 3/5 RLE strength 2/2 pain. R knee with edema to distal thigh and diffuse bruising. Exquisitely tender to mild palpation of R knee and shin. 2+ pitting edema to shins b/l. Minimal TTP to R 5th MT. Antalgic gait. Able to bear weight with walker. Neurovascularly intact.   ASSESSMENT/PLAN:   Foot pain, right S/p fall 1 month ago. XR at that time with questionable fracture in area of pain.  Recommended post-op shoe to aid in healing with evaluation the week following however did not obtain boot and lost to follow up. Will obtain follow up imaging today.  Pain of right lower extremity Knee and lower leg pain s/p fall 3 days ago with extensive swelling and bruising on exam and exquisite TTP. Certainly concern for fracture given h/o osteoporosis and exam. Obtain knee and tib/fib imaging. Continue prolia for osteoporosis. Tylenol prn for pain relief.   Frequent falls Multiple falls over the last month or so. Previously seen 10/16 for dizziness resulting in fall with concern for metabolic and blood sugar derangements and low normal BP. CBC at that time with anemia likely main contributor with some electrolyte derangements. Planned to f/u the following week with repeat labs but lost to follow up. Now following with Heme/Onc and s/p 1u pRBC with improvement, obtaining iron infusion next week. Has upcoming EGD/colonoscopy with GI. BP today at goal and dizziness improved. Overall deconditioning likely also contributing, also recommend PT pending today's XR results and acute healing. Will continue to follow Heme, GI, Endo w/u.      Myles Gip, DO

## 2021-12-22 NOTE — Assessment & Plan Note (Signed)
S/p fall 1 month ago. XR at that time with questionable fracture in area of pain. Recommended post-op shoe to aid in healing with evaluation the week following however did not obtain boot and lost to follow up. Will obtain follow up imaging today.

## 2021-12-22 NOTE — Assessment & Plan Note (Addendum)
Multiple falls over the last month or so. Previously seen 10/16 for dizziness resulting in fall with concern for metabolic and blood sugar derangements and low normal BP. CBC at that time with anemia likely main contributor with some electrolyte derangements. Planned to f/u the following week with repeat labs but lost to follow up. Now following with Heme/Onc and s/p 1u pRBC with improvement, obtaining iron infusion next week. Has upcoming EGD/colonoscopy with GI. BP today at goal and dizziness improved. Overall deconditioning likely also contributing, also recommend PT pending today's XR results and acute healing. Will continue to follow Heme, GI, Endo w/u.

## 2021-12-22 NOTE — Telephone Encounter (Signed)
CMN faxed back. Received successful fax confirmations.  No number or contact info documented for aero care.

## 2021-12-22 NOTE — Assessment & Plan Note (Addendum)
Knee and lower leg pain s/p fall 3 days ago with extensive swelling and bruising on exam and exquisite TTP. Certainly concern for fracture given h/o osteoporosis and exam. Obtain knee and tib/fib imaging. Continue prolia for osteoporosis. Tylenol prn for pain relief.

## 2021-12-22 NOTE — Telephone Encounter (Signed)
Agree with in person appt.

## 2021-12-22 NOTE — Patient Instructions (Signed)
It was great to see you!  Our plans for today:  - We are getting xrays of your knee and leg. We will call you with these results.  - Weigh your self daily. Wear compression socks and elevate your legs when you are sitting.  - Continue to follow with Oncology, GI, Endocrinology.  Take care and seek immediate care sooner if you develop any concerns.   Dr. Ky Barban

## 2021-12-22 NOTE — Telephone Encounter (Signed)
Done

## 2021-12-23 NOTE — Telephone Encounter (Signed)
Copied from [place] [id number]. Topic: General - Other >> Dec 23, 2021  4:39 PM Cyndi Bender wrote: Reason for CRM: Pt called for x-ray results. Pt requests call back

## 2021-12-24 DIAGNOSIS — E1151 Type 2 diabetes mellitus with diabetic peripheral angiopathy without gangrene: Secondary | ICD-10-CM | POA: Diagnosis not present

## 2021-12-24 DIAGNOSIS — I70248 Atherosclerosis of native arteries of left leg with ulceration of other part of lower left leg: Secondary | ICD-10-CM | POA: Diagnosis not present

## 2021-12-24 DIAGNOSIS — E1142 Type 2 diabetes mellitus with diabetic polyneuropathy: Secondary | ICD-10-CM | POA: Diagnosis not present

## 2021-12-24 DIAGNOSIS — E1129 Type 2 diabetes mellitus with other diabetic kidney complication: Secondary | ICD-10-CM | POA: Diagnosis not present

## 2021-12-24 DIAGNOSIS — E1121 Type 2 diabetes mellitus with diabetic nephropathy: Secondary | ICD-10-CM | POA: Diagnosis not present

## 2021-12-24 DIAGNOSIS — Z9641 Presence of insulin pump (external) (internal): Secondary | ICD-10-CM | POA: Diagnosis not present

## 2021-12-24 DIAGNOSIS — E113393 Type 2 diabetes mellitus with moderate nonproliferative diabetic retinopathy without macular edema, bilateral: Secondary | ICD-10-CM | POA: Diagnosis not present

## 2021-12-24 DIAGNOSIS — E1165 Type 2 diabetes mellitus with hyperglycemia: Secondary | ICD-10-CM | POA: Diagnosis not present

## 2021-12-24 DIAGNOSIS — I5032 Chronic diastolic (congestive) heart failure: Secondary | ICD-10-CM | POA: Diagnosis not present

## 2021-12-24 DIAGNOSIS — E559 Vitamin D deficiency, unspecified: Secondary | ICD-10-CM | POA: Diagnosis not present

## 2021-12-24 DIAGNOSIS — I110 Hypertensive heart disease with heart failure: Secondary | ICD-10-CM | POA: Diagnosis not present

## 2021-12-24 DIAGNOSIS — E1159 Type 2 diabetes mellitus with other circulatory complications: Secondary | ICD-10-CM | POA: Diagnosis not present

## 2021-12-24 DIAGNOSIS — E1122 Type 2 diabetes mellitus with diabetic chronic kidney disease: Secondary | ICD-10-CM | POA: Diagnosis not present

## 2021-12-24 DIAGNOSIS — M810 Age-related osteoporosis without current pathological fracture: Secondary | ICD-10-CM | POA: Diagnosis not present

## 2021-12-24 DIAGNOSIS — N2581 Secondary hyperparathyroidism of renal origin: Secondary | ICD-10-CM | POA: Diagnosis not present

## 2021-12-24 DIAGNOSIS — I10 Essential (primary) hypertension: Secondary | ICD-10-CM | POA: Diagnosis not present

## 2021-12-24 DIAGNOSIS — N184 Chronic kidney disease, stage 4 (severe): Secondary | ICD-10-CM | POA: Diagnosis not present

## 2021-12-25 NOTE — Telephone Encounter (Signed)
LMOVM for pt to return call. Okay for pec triage to advise patient of results. Thanks.

## 2021-12-27 ENCOUNTER — Encounter: Payer: Self-pay | Admitting: Oncology

## 2021-12-28 ENCOUNTER — Other Ambulatory Visit: Payer: Self-pay | Admitting: Family Medicine

## 2021-12-28 ENCOUNTER — Inpatient Hospital Stay: Payer: Medicare Other

## 2021-12-28 ENCOUNTER — Telehealth: Payer: Self-pay

## 2021-12-28 VITALS — BP 128/70 | HR 72 | Temp 97.9°F | Resp 18

## 2021-12-28 DIAGNOSIS — D72829 Elevated white blood cell count, unspecified: Secondary | ICD-10-CM | POA: Diagnosis not present

## 2021-12-28 DIAGNOSIS — N184 Chronic kidney disease, stage 4 (severe): Secondary | ICD-10-CM | POA: Diagnosis not present

## 2021-12-28 DIAGNOSIS — E1122 Type 2 diabetes mellitus with diabetic chronic kidney disease: Secondary | ICD-10-CM | POA: Diagnosis not present

## 2021-12-28 DIAGNOSIS — I129 Hypertensive chronic kidney disease with stage 1 through stage 4 chronic kidney disease, or unspecified chronic kidney disease: Secondary | ICD-10-CM | POA: Diagnosis not present

## 2021-12-28 DIAGNOSIS — J449 Chronic obstructive pulmonary disease, unspecified: Secondary | ICD-10-CM | POA: Diagnosis not present

## 2021-12-28 DIAGNOSIS — D75839 Thrombocytosis, unspecified: Secondary | ICD-10-CM | POA: Diagnosis not present

## 2021-12-28 DIAGNOSIS — I5032 Chronic diastolic (congestive) heart failure: Secondary | ICD-10-CM | POA: Diagnosis not present

## 2021-12-28 DIAGNOSIS — D649 Anemia, unspecified: Secondary | ICD-10-CM | POA: Diagnosis not present

## 2021-12-28 DIAGNOSIS — E1151 Type 2 diabetes mellitus with diabetic peripheral angiopathy without gangrene: Secondary | ICD-10-CM | POA: Diagnosis not present

## 2021-12-28 DIAGNOSIS — I110 Hypertensive heart disease with heart failure: Secondary | ICD-10-CM | POA: Diagnosis not present

## 2021-12-28 DIAGNOSIS — I70248 Atherosclerosis of native arteries of left leg with ulceration of other part of lower left leg: Secondary | ICD-10-CM | POA: Diagnosis not present

## 2021-12-28 DIAGNOSIS — D508 Other iron deficiency anemias: Secondary | ICD-10-CM

## 2021-12-28 DIAGNOSIS — N189 Chronic kidney disease, unspecified: Secondary | ICD-10-CM | POA: Diagnosis not present

## 2021-12-28 MED ORDER — SODIUM CHLORIDE 0.9 % IV SOLN
Freq: Once | INTRAVENOUS | Status: AC
  Filled 2021-12-28: qty 250

## 2021-12-28 MED ORDER — SODIUM CHLORIDE 0.9 % IV SOLN
200.0000 mg | INTRAVENOUS | Status: DC
  Administered 2021-12-28: 200 mg via INTRAVENOUS
  Filled 2021-12-28: qty 200

## 2021-12-28 NOTE — Telephone Encounter (Signed)
Copied from [place] [id number]. Topic: Quick Communication - Home Health Verbal Orders >> Dec 28, 2021 12:13 PM Marcellus Scott wrote: Caller/Agency: [place] Number: [phone]   [place] stated that the patient has a blister on her right lower leg that has ruptured. Malachy Mood stated that she would like to do woundcare twice a week with Xeroform

## 2021-12-28 NOTE — Progress Notes (Signed)
Pt had stated she felt weird but she couldn't explain. VS are below. Pt is stable. She was already anxious and she felt like that is what it was. Education given and AVS.  12/28/21 1421  Vitals  BP 128/70  BP Location Right Wrist  Patient Position Sitting  Pulse Rate 72  Pulse Oximetry  SpO2 94 %

## 2021-12-28 NOTE — Telephone Encounter (Signed)
Refilled 12/04/2021 #60 1 rf Requested Prescriptions  Pending Prescriptions Disp Refills   buPROPion (WELLBUTRIN SR) 150 MG 12 hr tablet [Pharmacy Med Name: BUPROPION HCL SR 150 MG TABLET] 180 tablet 1    Sig: TAKE 1 TABLET BY MOUTH TWICE A DAY     Psychiatry: Antidepressants - bupropion Failed - 12/28/2021 11:31 AM      Failed - Cr in normal range and within 360 days    Creat  Date Value Ref Range Status  03/21/2015 1.37 (H) 0.50 - 0.99 mg/dL Final   Creatinine, Ser  Date Value Ref Range Status  12/15/2021 2.68 (H) 0.44 - 1.00 mg/dL Final         Failed - AST in normal range and within 360 days    AST  Date Value Ref Range Status  12/15/2021 13 (L) 15 - 41 U/L Final         Passed - ALT in normal range and within 360 days    ALT  Date Value Ref Range Status  12/15/2021 10 0 - 44 U/L Final         Passed - Completed PHQ-2 or PHQ-9 in the last 360 days      Passed - Last BP in normal range    BP Readings from Last 1 Encounters:  12/28/21 128/70         Passed - Valid encounter within last 6 months    Recent Outpatient Visits           6 days ago Pain of right lower extremity   [facility] Myles Gip, DO   3 weeks ago Screening for colon cancer   [facility] Gwyneth Sprout, FNP   1 month ago Foot pain, right   [facility] Myles Gip, DO   3 months ago Hospital discharge follow-up   [facility] Gwyneth Sprout, FNP   5 months ago Fall, subsequent encounter   [facility] Gwyneth Sprout, FNP       Future Appointments             Tomorrow Sindy Guadeloupe, MD [facility] [place]   In 1 week Amalia Hailey, Dorathy Daft, DPM [facility] [place], [place]   In 3 weeks Gerrie Nordmann, NP [place]. [place]

## 2021-12-29 ENCOUNTER — Ambulatory Visit: Payer: Medicare Other

## 2021-12-29 ENCOUNTER — Encounter: Payer: Self-pay | Admitting: Oncology

## 2021-12-29 ENCOUNTER — Ambulatory Visit: Payer: Medicare Other | Admitting: Oncology

## 2021-12-29 ENCOUNTER — Inpatient Hospital Stay (HOSPITAL_BASED_OUTPATIENT_CLINIC_OR_DEPARTMENT_OTHER): Payer: Medicare Other | Admitting: Oncology

## 2021-12-29 DIAGNOSIS — D509 Iron deficiency anemia, unspecified: Secondary | ICD-10-CM

## 2021-12-29 DIAGNOSIS — D508 Other iron deficiency anemias: Secondary | ICD-10-CM | POA: Diagnosis not present

## 2021-12-29 NOTE — Telephone Encounter (Signed)
Spoke with [employer].

## 2021-12-29 NOTE — Telephone Encounter (Signed)
Spoke with patient.

## 2021-12-30 ENCOUNTER — Telehealth: Payer: Self-pay

## 2021-12-30 ENCOUNTER — Encounter: Payer: Self-pay | Admitting: Oncology

## 2021-12-30 ENCOUNTER — Encounter: Payer: Self-pay | Admitting: Gastroenterology

## 2021-12-30 DIAGNOSIS — Z86010 Personal history of colonic polyps: Secondary | ICD-10-CM

## 2021-12-30 MED FILL — Iron Sucrose Inj 20 MG/ML (Fe Equiv): INTRAVENOUS | Qty: 10 | Status: AC

## 2021-12-30 NOTE — Progress Notes (Signed)
I connected with Jocelyn Wells on 12/30/21 at  2:45 PM EST by video enabled telemedicine visit and verified that I am speaking with the correct person using two identifiers.   I discussed the limitations, risks, security and privacy concerns of performing an evaluation and management service by telemedicine and the availability of in-person appointments. I also discussed with the patient that there may be a patient responsible charge related to this service. The patient expressed understanding and agreed to proceed.  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  Most recent CBC from 12/07/2021 showed white cell count of 13.9, H&H of 7.2/24.6 with an MCV of 80.9 and a platelet count of 535.  Differential is mainly shown neutrophilia with occasional monocytosis her white cell count at baseline up until July 2023 was around 11 and it dropped down to 8.2 in August 2023.  She has had some borderline microcytosis over the years.  No recent iron studies have been checked.  She has not seen GI recently.  Is any vaginal bleeding.  Denies any blood loss in her stool or urine.  Denies any dark melanotic stools.  Denies any consistent use of NSAIDs   Interval history patient denies any blood loss in her stool or urine.  She reports some improvement in her energy levels after receiving blood and 1 dose of iron.   Review of Systems  Constitutional:  Positive for malaise/fatigue. Negative for chills, fever and weight loss.  HENT:  Negative for congestion, ear discharge and nosebleeds.   Eyes:  Negative for blurred vision.  Respiratory:  Negative for cough,  hemoptysis, sputum production, shortness of breath and wheezing.   Cardiovascular:  Negative for chest pain, palpitations, orthopnea and claudication.  Gastrointestinal:  Negative for abdominal pain, blood in stool, constipation, diarrhea, heartburn, melena, nausea and vomiting.  Genitourinary:  Negative for dysuria, flank pain, frequency, hematuria and urgency.  Musculoskeletal:  Negative for back pain, joint pain and myalgias.  Skin:  Negative for rash.  Neurological:  Negative for dizziness, tingling, focal weakness, seizures, weakness and headaches.  Endo/Heme/Allergies:  Does not bruise/bleed easily.  Psychiatric/Behavioral:  Negative for depression and suicidal ideas. The patient does not have insomnia.     Allergies  Allergen Reactions   Codeine Nausea And Vomiting   Ivp Dye [Iodinated Contrast Media]     Patients states the IV Dye shuts her kidneys down    Past Medical History:  Diagnosis Date   Anemia    Anxiety    Cardiomyopathy ([place])    new to her Jan 2017   CHF (congestive heart failure) (HCC)    Chronic kidney disease    COPD (chronic obstructive pulmonary disease) (HCC)    Depression    Diabetes mellitus, type II ([place])    HTN (hypertension)    Leukocytosis    Obesity    Osteoporosis    PONV (postoperative nausea and vomiting)    Stroke [facility])    Jan 2017    Past Surgical History:  Procedure Laterality Date   ABDOMINAL HYSTERECTOMY     ANKLE FRACTURE SURGERY Left 2002   BILATERAL SALPINGOOPHORECTOMY  2000   CARDIAC CATHETERIZATION N/A 03/25/2015   Procedure:  Right/Left Heart Cath and Coronary Angiography;  Surgeon: Belva Crome, MD;  Location: [place] [facility];  Service: Cardiovascular;  Laterality: N/A;   CESAREAN SECTION  1978   COLONOSCOPY WITH PROPOFOL N/A 09/22/2016   Procedure: COLONOSCOPY WITH PROPOFOL;  Surgeon: Manya Silvas, MD;  Location[facility] ENDOSCOPY;  Service: Endoscopy;  Laterality: N/A;   EP IMPLANTABLE DEVICE N/A 08/05/2015    Procedure: Loop Recorder Insertion;  Surgeon: Deboraha Sprang, MD;  Location: [place] [facility];  Service: Cardiovascular;  Laterality: N/A;   ESOPHAGOGASTRODUODENOSCOPY (EGD) WITH PROPOFOL N/A 09/22/2016   Procedure: ESOPHAGOGASTRODUODENOSCOPY (EGD) WITH PROPOFOL;  Surgeon: Manya Silvas, MD;  Location: [facility] ENDOSCOPY;  Service: Endoscopy;  Laterality: N/A;   ESOPHAGOGASTRODUODENOSCOPY (EGD) WITH PROPOFOL N/A 12/08/2016   Procedure: ESOPHAGOGASTRODUODENOSCOPY (EGD) WITH PROPOFOL;  Surgeon: Manya Silvas, MD;  Location: [facility] ENDOSCOPY;  Service: Endoscopy;  Laterality: N/A;   KNEE ARTHROSCOPY Left 2005   LOWER EXTREMITY ANGIOGRAPHY Left 08/12/2021   Procedure: Lower Extremity Angiography;  Surgeon: Katha Cabal, MD;  Location: [place] [facility];  Service: Cardiovascular;  Laterality: Left;   TEE WITHOUT CARDIOVERSION N/A 01/31/2015   Procedure: TRANSESOPHAGEAL ECHOCARDIOGRAM (TEE);  Surgeon: Lelon Perla, MD;  Location: [facility] ENDOSCOPY;  Service: Cardiovascular;  Laterality: N/A;   TEMPORARY DIALYSIS CATHETER N/A 08/14/2021   Procedure: TEMPORARY DIALYSIS CATHETER;  Surgeon: Katha Cabal, MD;  Location: [place] [facility];  Service: Cardiovascular;  Laterality: N/A;   TUBAL LIGATION  1978   ULNAR NERVE TRANSPOSITION  2008    Social History   Socioeconomic History   Marital status: Married    Spouse name: Not on file   Number of children: 2   Years of education: Not on file   Highest education level: Bachelor's degree (e.g., BA, AB, BS)  Occupational History   Occupation: disabled    Comment: retired  Tobacco Use   Smoking status: Every Day    Packs/day: 0.50    Years: 47.00    Total pack years: 23.50    Types: Cigarettes    Start date: 02/08/1974   Smokeless tobacco: Never   Tobacco comments:    1-3 cigarettes a day. 12/09/2021 khj  Vaping Use   Vaping Use: Never used  Substance and Sexual Activity   Alcohol use: No    Alcohol/week: 0.0 standard drinks of  alcohol   Drug use: No   Sexual activity: Yes    Birth control/protection: None  Other Topics Concern   Not on file  Social History Narrative   Lives at home with stepson and husband, dogs and cats   Caffeine  Drinks sweet tea.   Right handed.    Social Determinants of Health   Financial Resource Strain: Low Risk  (12/11/2021)   Overall Financial Resource Strain (CARDIA)    Difficulty of Paying Living Expenses: Not very hard  Food Insecurity: No Food Insecurity (12/11/2021)   Hunger Vital Sign    Worried About Running Out of Food in the Last Year: Never true    Ran Out of Food in the Last Year: Never true  Transportation Needs: No Transportation Needs (12/15/2021)   PRAPARE - Hydrologist (Medical): No    Lack of Transportation (Non-Medical): No  Physical Activity: Inactive (12/11/2021)   Exercise Vital Sign    Days of Exercise per Week: 0 days    Minutes of Exercise per Session: 0 min  Stress: Stress Concern Present (12/11/2021)   [employer]  of Occupational Health - Occupational Stress Questionnaire    Feeling of Stress : To some extent  Social Connections: Moderately Isolated (12/11/2021)   Social Connection and Isolation Panel [NHANES]    Frequency of Communication with Friends and Family: More than three times a week    Frequency of Social Gatherings with Friends and Family: Once a week    Attends Religious Services: Never    Marine scientist or Organizations: No    Attends Archivist Meetings: Never    Marital Status: Married  Human resources officer Violence: Not At Risk (05/27/2021)   Humiliation, Afraid, Rape, and Kick questionnaire    Fear of Current or Ex-Partner: No    Emotionally Abused: No    Physically Abused: No    Sexually Abused: No    Family History  Problem Relation Age of Onset   Heart disease Mother        died from CHF   Asthma Mother    Diabetes Mother    Heart disease Father    Aneurysm Father    COPD  Brother    Diabetes Brother    Alcohol abuse Paternal Aunt    Cancer Maternal Grandmother        unknownorigin   Anemia Neg Hx    Arrhythmia Neg Hx    Clotting disorder Neg Hx    Fainting Neg Hx    Heart attack Neg Hx    Heart failure Neg Hx    Hyperlipidemia Neg Hx    Hypertension Neg Hx      Current Outpatient Medications:    albuterol (VENTOLIN HFA) 108 (90 Base) MCG/ACT inhaler, TAKE 2 PUFFS BY MOUTH EVERY 6 HOURS AS NEEDED FOR WHEEZE OR SHORTNESS OF BREATH, Disp: 8.5 each, Rfl: 2   apixaban (ELIQUIS) 5 MG TABS tablet, Take 1 tablet (5 mg total) by mouth 2 (two) times daily., Disp: 180 tablet, Rfl: 3   atorvastatin (LIPITOR) 40 MG tablet, Take 1 tablet (40 mg total) by mouth daily., Disp: 90 tablet, Rfl: 3   buPROPion (WELLBUTRIN SR) 150 MG 12 hr tablet, Take 1 tablet (150 mg total) by mouth 2 (two) times daily., Disp: 60 tablet, Rfl: 1   carvedilol (COREG) 12.5 MG tablet, Take 1 tablet (12.5 mg total) by mouth 2 (two) times daily with a meal. (Patient taking differently: Take 6.25 mg by mouth 2 (two) times daily with a meal.), Disp: 180 tablet, Rfl: 0   clopidogrel (PLAVIX) 75 MG tablet, Take 1 tablet (75 mg total) by mouth daily., Disp: 90 tablet, Rfl: 3   Dulaglutide 3 MG/0.5ML SOPN, Inject into the skin once a week., Disp: , Rfl:    fluticasone (FLONASE) 50 MCG/ACT nasal spray, Place 2 sprays into both nostrils daily., Disp: 16 g, Rfl: 6   gabapentin (NEURONTIN) 300 MG capsule, Take 300 mg by mouth 2 (two) times daily., Disp: , Rfl:    glucose blood test strip, OneTouch Verio strips, Disp: , Rfl:    insulin aspart (NOVOLOG) 100 UNIT/ML injection, Insulin pump, Disp: , Rfl:    Insulin Disposable Pump (OMNIPOD DASH PODS, GEN 4,) MISC, USE 1 POD EACH EVERY 72    HOURS, Disp: , Rfl:    Insulin Human (INSULIN PUMP) SOLN, Per endocrinoloy, Disp: , Rfl:    loratadine (CLARITIN) 10 MG tablet, Take 1 tablet by mouth daily., Disp: , Rfl:    losartan (COZAAR) 50 MG tablet, Take 1 tablet  (50 mg total) by mouth daily., Disp: 90 tablet, Rfl:  0   pantoprazole (PROTONIX) 40 MG tablet, Take 1 tablet (40 mg total) by mouth 2 (two) times daily., Disp: 60 tablet, Rfl: 0   PARoxetine (PAXIL) 40 MG tablet, Take 1 tablet (40 mg total) by mouth daily., Disp: 90 tablet, Rfl: 1   silver sulfADIAZINE (SILVADENE) 1 % cream, Apply 1 Application topically daily., Disp: 400 g, Rfl: 0   Spacer/Aero-Holding Chambers [facility] DIAMOND) MISC, Use with inhaler to ensure medication is delivered throughout lungs, Disp: 1 each, Rfl: 0   torsemide (DEMADEX) 20 MG tablet, Take 20 mg by mouth daily., Disp: , Rfl:    traMADol (ULTRAM) 50 MG tablet, Take 1 tablet (50 mg total) by mouth every 12 (twelve) hours as needed., Disp: 60 tablet, Rfl: 3   meclizine (ANTIVERT) 12.5 MG tablet, Take 1 tablet (12.5 mg total) by mouth 3 (three) times daily as needed for dizziness. (Patient not taking: Reported on 12/16/2021), Disp: 30 tablet, Rfl: 0  DG Foot Complete Right  Result Date: 12/23/2021 CLINICAL DATA:  Foot pain status post fall. EXAM: RIGHT FOOT COMPLETE - 3+ VIEW COMPARISON:  Right ankle and foot radiographs 07/14/2021 FINDINGS: Moderate to severe lateral great toe metatarsophalangeal joint space narrowing with mild-to-moderate peripheral lateral degenerative spurring. Tiny plantar calcaneal heel spur. Mild-to-moderate dorsal forefoot soft tissue swelling. No acute fracture is seen.  No dislocation. IMPRESSION: 1. No acute fracture. 2. Moderate to severe great toe metatarsophalangeal joint osteoarthritis. Electronically Signed   By: Yvonne Kendall M.D.   On: 12/23/2021 15:19   DG Tibia/Fibula Right  Result Date: 12/23/2021 CLINICAL DATA:  Lower leg pain status post fall. EXAM: RIGHT TIBIA AND FIBULA - 2 VIEW COMPARISON:  Right ankle radiographs 07/14/2021, right knee radiographs 11/23/2012 FINDINGS: Moderate medial compartment of the knee joint space narrowing. The ankle mortise is symmetric and intact. Mild  superior patellar degenerative osteophytosis. Small knee joint effusion. No acute fracture is seen. No dislocation. IMPRESSION: 1. No acute fracture. 2. Moderate medial compartment of the knee osteoarthritis. Electronically Signed   By: Yvonne Kendall M.D.   On: 12/23/2021 15:17   DG Knee Complete 4 Views Right  Result Date: 12/23/2021 CLINICAL DATA:  Right knee pain status post fall. Fell twice 4 days prior. Medial anterior knee pain, bruising, and swelling. EXAM: RIGHT KNEE - COMPLETE 4+ VIEW COMPARISON:  Right knee radiographs 11/23/2012 FINDINGS: Moderate medial compartment joint space narrowing and mild peripheral osteophytosis. Moderate patellofemoral joint space narrowing and mild peripheral osteophytosis. Small joint effusion, new from prior. New swelling and increased density within the soft tissues anterior to the patella and proximal tibia, likely a posttraumatic soft tissue hematoma measuring up to approximately 13.5 cm in craniocaudal dimension and 2.5 cm in AP dimension. 6 mm age-indeterminate ossicle just anterior to the patella. No acute fracture is seen. No dislocation. IMPRESSION: 1. New swelling and increased density within the soft tissues anterior to the patella and proximal tibia, likely a posttraumatic soft tissue hematoma. 2. Moderate medial and patellofemoral compartment osteoarthritis. 3. No acute fracture is seen. Electronically Signed   By: Yvonne Kendall M.D.   On: 12/23/2021 15:15    No images are attached to the encounter.      Latest Ref Rng & Units 12/15/2021    3:28 PM  CMP  Glucose 70 - 99 mg/dL 133   BUN 8 - 23 mg/dL 38   Creatinine 0.44 - 1.00 mg/dL 2.68   Sodium 135 - 145 mmol/L 137   Potassium 3.5 - 5.1 mmol/L 5.4  Chloride 98 - 111 mmol/L 109   CO2 22 - 32 mmol/L 22   Calcium 8.9 - 10.3 mg/dL 6.8   Total Protein 6.5 - 8.1 g/dL 6.4   Total Bilirubin 0.3 - 1.2 mg/dL 0.4   Alkaline Phos 38 - 126 U/L 81   AST 15 - 41 U/L 13   ALT 0 - 44 U/L 10        Latest Ref Rng & Units 12/15/2021    3:28 PM  CBC  WBC 4.0 - 10.5 K/uL 12.4   Hemoglobin 12.0 - 15.0 g/dL 6.7   Hematocrit 36.0 - 46.0 % 23.4   Platelets 150 - 400 K/uL 496      Observation/objective: Appears in no acute distress over video visit today.  Breathing is nonlabored  Assessment and plan: Patient is a 66 year old female referred for leukocytosis  Suspect leukocytosis and thrombocytosis is reactive and I will continue to monitor those.  I am not concerned about her iron deficiency anemia as she is significantly anemic and recently received a blood transfusion.  She has received 1 dose of IV iron and will receive 4 more doses.Results of anemia workup otherwise showed a normal B12 level.  No evidence of hemolysis.  Myeloma panel was otherwise unremarkable.  Both kappa and lambda light chains were elevated but there free light chain ratio was normal.  Her anemia presently seems to be driven by her iron deficiency although there may be still a component of chronic kidney disease as well.  Patient will be getting EGD and colonoscopy with Dr. Marius Ditch next week.  I have also reached out to vascular surgery as the patient is on both Plavix and Eliquis which will need to be held for her colonoscopy.  Follow-up instructions: I will continue to monitor her CBC closely every month and see her back in 3 months with CBC ferritin and iron studies  I discussed the assessment and treatment plan with the patient. The patient was provided an opportunity to ask questions and all were answered. The patient agreed with the plan and demonstrated an understanding of the instructions.   The patient was advised to call back or seek an in-person evaluation if the symptoms worsen or if the condition fails to improve as anticipated.  I provided 12 minutes of face-to-face video visit time during this encounter, and > 50% was spent counseling as documented under my assessment & plan.  Visit Diagnosis: 1. Other  iron deficiency anemia     Dr. Randa Evens, MD, MPH [facility] at [facility] Tel- [phone] 12/30/2021 8:28 AM

## 2021-12-30 NOTE — Telephone Encounter (Signed)
Patient contacted office stated that she had taken her Trulicity forgot to stop it.  She had not received stop dates for her Plavix Gerrie Nordmann, NP) and Eliquis (Dr. Delana Meyer).  Blood thinner request have been faxed again and patient has been rescheduled for her colonoscopy on 02/15/22.  Thanks,  [place], [place]

## 2022-01-01 ENCOUNTER — Inpatient Hospital Stay
Admission: EM | Admit: 2022-01-01 | Discharge: 2022-01-04 | DRG: 291 | Disposition: A | Discharge status: Home Health | Payer: Medicare Other | Attending: Internal Medicine | Admitting: Internal Medicine

## 2022-01-01 ENCOUNTER — Other Ambulatory Visit: Payer: Self-pay

## 2022-01-01 ENCOUNTER — Emergency Department: Payer: Medicare Other

## 2022-01-01 DIAGNOSIS — J811 Chronic pulmonary edema: Secondary | ICD-10-CM | POA: Diagnosis not present

## 2022-01-01 DIAGNOSIS — E7800 Pure hypercholesterolemia, unspecified: Secondary | ICD-10-CM | POA: Diagnosis present

## 2022-01-01 DIAGNOSIS — R0689 Other abnormalities of breathing: Secondary | ICD-10-CM | POA: Diagnosis not present

## 2022-01-01 DIAGNOSIS — Z9981 Dependence on supplemental oxygen: Secondary | ICD-10-CM

## 2022-01-01 DIAGNOSIS — E1129 Type 2 diabetes mellitus with other diabetic kidney complication: Secondary | ICD-10-CM | POA: Diagnosis present

## 2022-01-01 DIAGNOSIS — Z91041 Radiographic dye allergy status: Secondary | ICD-10-CM

## 2022-01-01 DIAGNOSIS — I152 Hypertension secondary to endocrine disorders: Secondary | ICD-10-CM | POA: Diagnosis present

## 2022-01-01 DIAGNOSIS — E1140 Type 2 diabetes mellitus with diabetic neuropathy, unspecified: Secondary | ICD-10-CM | POA: Diagnosis present

## 2022-01-01 DIAGNOSIS — N2581 Secondary hyperparathyroidism of renal origin: Secondary | ICD-10-CM | POA: Diagnosis present

## 2022-01-01 DIAGNOSIS — E1165 Type 2 diabetes mellitus with hyperglycemia: Secondary | ICD-10-CM | POA: Diagnosis present

## 2022-01-01 DIAGNOSIS — M810 Age-related osteoporosis without current pathological fracture: Secondary | ICD-10-CM | POA: Diagnosis present

## 2022-01-01 DIAGNOSIS — Z95828 Presence of other vascular implants and grafts: Secondary | ICD-10-CM

## 2022-01-01 DIAGNOSIS — I10 Essential (primary) hypertension: Secondary | ICD-10-CM | POA: Diagnosis present

## 2022-01-01 DIAGNOSIS — Z79899 Other long term (current) drug therapy: Secondary | ICD-10-CM

## 2022-01-01 DIAGNOSIS — I5033 Acute on chronic diastolic (congestive) heart failure: Principal | ICD-10-CM | POA: Diagnosis present

## 2022-01-01 DIAGNOSIS — Z8673 Personal history of transient ischemic attack (TIA), and cerebral infarction without residual deficits: Secondary | ICD-10-CM | POA: Diagnosis not present

## 2022-01-01 DIAGNOSIS — F17210 Nicotine dependence, cigarettes, uncomplicated: Secondary | ICD-10-CM | POA: Diagnosis present

## 2022-01-01 DIAGNOSIS — D509 Iron deficiency anemia, unspecified: Secondary | ICD-10-CM | POA: Diagnosis present

## 2022-01-01 DIAGNOSIS — F330 Major depressive disorder, recurrent, mild: Secondary | ICD-10-CM | POA: Diagnosis present

## 2022-01-01 DIAGNOSIS — E1151 Type 2 diabetes mellitus with diabetic peripheral angiopathy without gangrene: Secondary | ICD-10-CM | POA: Diagnosis not present

## 2022-01-01 DIAGNOSIS — I452 Bifascicular block: Secondary | ICD-10-CM | POA: Diagnosis present

## 2022-01-01 DIAGNOSIS — I129 Hypertensive chronic kidney disease with stage 1 through stage 4 chronic kidney disease, or unspecified chronic kidney disease: Secondary | ICD-10-CM | POA: Diagnosis not present

## 2022-01-01 DIAGNOSIS — Z9641 Presence of insulin pump (external) (internal): Secondary | ICD-10-CM | POA: Diagnosis present

## 2022-01-01 DIAGNOSIS — Z7985 Long-term (current) use of injectable non-insulin antidiabetic drugs: Secondary | ICD-10-CM

## 2022-01-01 DIAGNOSIS — Z86718 Personal history of other venous thrombosis and embolism: Secondary | ICD-10-CM

## 2022-01-01 DIAGNOSIS — R0602 Shortness of breath: Secondary | ICD-10-CM | POA: Diagnosis not present

## 2022-01-01 DIAGNOSIS — J449 Chronic obstructive pulmonary disease, unspecified: Secondary | ICD-10-CM | POA: Diagnosis present

## 2022-01-01 DIAGNOSIS — L989 Disorder of the skin and subcutaneous tissue, unspecified: Secondary | ICD-10-CM | POA: Diagnosis present

## 2022-01-01 DIAGNOSIS — J9621 Acute and chronic respiratory failure with hypoxia: Secondary | ICD-10-CM | POA: Diagnosis present

## 2022-01-01 DIAGNOSIS — R609 Edema, unspecified: Secondary | ICD-10-CM | POA: Diagnosis not present

## 2022-01-01 DIAGNOSIS — N179 Acute kidney failure, unspecified: Secondary | ICD-10-CM | POA: Diagnosis not present

## 2022-01-01 DIAGNOSIS — N184 Chronic kidney disease, stage 4 (severe): Secondary | ICD-10-CM | POA: Diagnosis present

## 2022-01-01 DIAGNOSIS — Z825 Family history of asthma and other chronic lower respiratory diseases: Secondary | ICD-10-CM

## 2022-01-01 DIAGNOSIS — I429 Cardiomyopathy, unspecified: Secondary | ICD-10-CM | POA: Diagnosis present

## 2022-01-01 DIAGNOSIS — Z833 Family history of diabetes mellitus: Secondary | ICD-10-CM

## 2022-01-01 DIAGNOSIS — R809 Proteinuria, unspecified: Secondary | ICD-10-CM | POA: Diagnosis not present

## 2022-01-01 DIAGNOSIS — Z7901 Long term (current) use of anticoagulants: Secondary | ICD-10-CM

## 2022-01-01 DIAGNOSIS — R0902 Hypoxemia: Secondary | ICD-10-CM | POA: Diagnosis not present

## 2022-01-01 DIAGNOSIS — I5032 Chronic diastolic (congestive) heart failure: Secondary | ICD-10-CM | POA: Diagnosis not present

## 2022-01-01 DIAGNOSIS — Z794 Long term (current) use of insulin: Secondary | ICD-10-CM

## 2022-01-01 DIAGNOSIS — D631 Anemia in chronic kidney disease: Secondary | ICD-10-CM | POA: Diagnosis not present

## 2022-01-01 DIAGNOSIS — Z7902 Long term (current) use of antithrombotics/antiplatelets: Secondary | ICD-10-CM

## 2022-01-01 DIAGNOSIS — D649 Anemia, unspecified: Principal | ICD-10-CM | POA: Diagnosis present

## 2022-01-01 DIAGNOSIS — E1159 Type 2 diabetes mellitus with other circulatory complications: Secondary | ICD-10-CM | POA: Diagnosis present

## 2022-01-01 DIAGNOSIS — Z885 Allergy status to narcotic agent status: Secondary | ICD-10-CM

## 2022-01-01 DIAGNOSIS — E1122 Type 2 diabetes mellitus with diabetic chronic kidney disease: Secondary | ICD-10-CM | POA: Diagnosis not present

## 2022-01-01 DIAGNOSIS — I70248 Atherosclerosis of native arteries of left leg with ulceration of other part of lower left leg: Secondary | ICD-10-CM | POA: Diagnosis not present

## 2022-01-01 DIAGNOSIS — R079 Chest pain, unspecified: Secondary | ICD-10-CM | POA: Diagnosis not present

## 2022-01-01 DIAGNOSIS — I110 Hypertensive heart disease with heart failure: Secondary | ICD-10-CM | POA: Diagnosis not present

## 2022-01-01 DIAGNOSIS — Z8249 Family history of ischemic heart disease and other diseases of the circulatory system: Secondary | ICD-10-CM

## 2022-01-01 LAB — CBC
HCT: 24.1 % — ABNORMAL LOW (ref 36.0–46.0)
Hemoglobin: 6.7 g/dL — ABNORMAL LOW (ref 12.0–15.0)
MCH: 22.9 pg — ABNORMAL LOW (ref 26.0–34.0)
MCHC: 27.8 g/dL — ABNORMAL LOW (ref 30.0–36.0)
MCV: 82.3 fL (ref 80.0–100.0)
Platelets: 389 10*3/uL (ref 150–400)
RBC: 2.93 MIL/uL — ABNORMAL LOW (ref 3.87–5.11)
RDW: 19.9 % — ABNORMAL HIGH (ref 11.5–15.5)
WBC: 12.9 10*3/uL — ABNORMAL HIGH (ref 4.0–10.5)
nRBC: 0 % (ref 0.0–0.2)

## 2022-01-01 LAB — URINALYSIS, ROUTINE W REFLEX MICROSCOPIC
Bilirubin Urine: NEGATIVE
Glucose, UA: 50 mg/dL — AB
Ketones, ur: NEGATIVE mg/dL
Leukocytes,Ua: NEGATIVE
Nitrite: NEGATIVE
Protein, ur: >=300 mg/dL — AB
Specific Gravity, Urine: 1.017 (ref 1.005–1.030)
pH: 5 (ref 5.0–8.0)

## 2022-01-01 LAB — TROPONIN I (HIGH SENSITIVITY)
Troponin I (High Sensitivity): 18 ng/L — ABNORMAL HIGH
Troponin I (High Sensitivity): 18 ng/L — ABNORMAL HIGH (ref <18)

## 2022-01-01 LAB — BASIC METABOLIC PANEL
Anion gap: 7 (ref 5–15)
BUN: 29 mg/dL — ABNORMAL HIGH (ref 8–23)
CO2: 23 mmol/L (ref 22–32)
Calcium: 8.1 mg/dL — ABNORMAL LOW (ref 8.9–10.3)
Chloride: 114 mmol/L — ABNORMAL HIGH (ref 98–111)
Creatinine, Ser: 2.63 mg/dL — ABNORMAL HIGH (ref 0.44–1.00)
GFR, Estimated: 19 mL/min — ABNORMAL LOW (ref >60)
Glucose, Bld: 79 mg/dL (ref 70–99)
Potassium: 4.5 mmol/L (ref 3.5–5.1)
Sodium: 144 mmol/L (ref 135–145)

## 2022-01-01 LAB — CBG MONITORING, ED: Glucose-Capillary: 161 mg/dL — ABNORMAL HIGH (ref 70–99)

## 2022-01-01 LAB — BRAIN NATRIURETIC PEPTIDE: B Natriuretic Peptide: 2508.4 pg/mL — ABNORMAL HIGH (ref 0.0–100.0)

## 2022-01-01 LAB — PREPARE RBC (CROSSMATCH)

## 2022-01-01 MED ORDER — LOSARTAN POTASSIUM 50 MG PO TABS
50.0000 mg | ORAL_TABLET | Freq: Every day | ORAL | Status: DC
  Administered 2022-01-01 – 2022-01-04 (×4): 50 mg via ORAL
  Filled 2022-01-01 (×4): qty 1

## 2022-01-01 MED ORDER — INSULIN PUMP
Freq: Three times a day (TID) | SUBCUTANEOUS | Status: DC
  Administered 2022-01-04: 5.5 via SUBCUTANEOUS
  Administered 2022-01-04: 15.25 via SUBCUTANEOUS
  Filled 2022-01-01: qty 1

## 2022-01-01 MED ORDER — SODIUM CHLORIDE 0.9 % IV SOLN
10.0000 mL/h | Freq: Once | INTRAVENOUS | Status: AC
  Administered 2022-01-01: 10 mL/h via INTRAVENOUS

## 2022-01-01 MED ORDER — CLOPIDOGREL BISULFATE 75 MG PO TABS
75.0000 mg | ORAL_TABLET | Freq: Every day | ORAL | Status: DC
  Administered 2022-01-01 – 2022-01-04 (×4): 75 mg via ORAL
  Filled 2022-01-01 (×4): qty 1

## 2022-01-01 MED ORDER — ACETAMINOPHEN 650 MG RE SUPP: 650.0000 mg | Freq: Four times a day (QID) | RECTAL | Status: DC | PRN

## 2022-01-01 MED ORDER — ACETAMINOPHEN 325 MG PO TABS
650.0000 mg | ORAL_TABLET | Freq: Four times a day (QID) | ORAL | Status: DC | PRN
  Filled 2022-01-01: qty 2

## 2022-01-01 MED ORDER — SENNOSIDES-DOCUSATE SODIUM 8.6-50 MG PO TABS: 1.0000 | ORAL_TABLET | Freq: Every evening | ORAL | Status: DC | PRN

## 2022-01-01 MED ORDER — ATORVASTATIN CALCIUM 20 MG PO TABS
40.0000 mg | ORAL_TABLET | Freq: Every day | ORAL | Status: DC
  Administered 2022-01-01 – 2022-01-04 (×4): 40 mg via ORAL
  Filled 2022-01-01 (×4): qty 2

## 2022-01-01 MED ORDER — APIXABAN 5 MG PO TABS
5.0000 mg | ORAL_TABLET | Freq: Two times a day (BID) | ORAL | Status: DC
  Administered 2022-01-01 – 2022-01-04 (×6): 5 mg via ORAL
  Filled 2022-01-01 (×6): qty 1

## 2022-01-01 MED ORDER — ALBUTEROL SULFATE (2.5 MG/3ML) 0.083% IN NEBU: 2.5000 mg | INHALATION_SOLUTION | Freq: Four times a day (QID) | RESPIRATORY_TRACT | Status: DC | PRN

## 2022-01-01 MED ORDER — FUROSEMIDE 10 MG/ML IJ SOLN: 40.0000 mg | Freq: Two times a day (BID) | INTRAMUSCULAR | Status: DC

## 2022-01-01 MED ORDER — MECLIZINE HCL 25 MG PO TABS: 12.5000 mg | ORAL_TABLET | Freq: Three times a day (TID) | ORAL | Status: DC | PRN

## 2022-01-01 MED ORDER — PANTOPRAZOLE SODIUM 40 MG PO TBEC
40.0000 mg | DELAYED_RELEASE_TABLET | Freq: Two times a day (BID) | ORAL | Status: DC
  Administered 2022-01-01 – 2022-01-04 (×6): 40 mg via ORAL
  Filled 2022-01-01 (×6): qty 1

## 2022-01-01 MED ORDER — CARVEDILOL 6.25 MG PO TABS: 12.5000 mg | ORAL_TABLET | Freq: Two times a day (BID) | ORAL | Status: DC

## 2022-01-01 MED ORDER — TRAMADOL HCL 50 MG PO TABS
50.0000 mg | ORAL_TABLET | Freq: Two times a day (BID) | ORAL | Status: DC | PRN
  Administered 2022-01-02 – 2022-01-04 (×3): 50 mg via ORAL
  Filled 2022-01-01 (×3): qty 1

## 2022-01-01 MED ORDER — TORSEMIDE 20 MG PO TABS: 20.0000 mg | ORAL_TABLET | Freq: Every day | ORAL | Status: DC

## 2022-01-01 MED ORDER — SILVER SULFADIAZINE 1 % EX CREA
1.0000 | TOPICAL_CREAM | Freq: Every day | CUTANEOUS | Status: DC
  Administered 2022-01-03 – 2022-01-04 (×2): 1 via TOPICAL
  Filled 2022-01-01 (×3): qty 85

## 2022-01-01 MED ORDER — PAROXETINE HCL 20 MG PO TABS
40.0000 mg | ORAL_TABLET | Freq: Every day | ORAL | Status: DC
  Administered 2022-01-02 – 2022-01-04 (×4): 40 mg via ORAL
  Filled 2022-01-01 (×4): qty 2

## 2022-01-01 MED ORDER — GABAPENTIN 300 MG PO CAPS
300.0000 mg | ORAL_CAPSULE | Freq: Two times a day (BID) | ORAL | Status: DC
  Administered 2022-01-01 – 2022-01-04 (×6): 300 mg via ORAL
  Filled 2022-01-01 (×6): qty 1

## 2022-01-01 MED ORDER — BUPROPION HCL ER (SR) 150 MG PO TB12
150.0000 mg | ORAL_TABLET | Freq: Two times a day (BID) | ORAL | Status: DC
  Administered 2022-01-02 – 2022-01-04 (×6): 150 mg via ORAL
  Filled 2022-01-01 (×6): qty 1

## 2022-01-01 MED ORDER — FUROSEMIDE 10 MG/ML IJ SOLN
40.0000 mg | Freq: Once | INTRAMUSCULAR | Status: AC
  Administered 2022-01-01: 40 mg via INTRAVENOUS
  Filled 2022-01-01: qty 4

## 2022-01-01 NOTE — H&P (Signed)
History and Physical    BERNICE MCAULIFFE XBJ:[id number] DOB: Jan 13, 1956 DOA: 01/01/2022  PCP: Gwyneth Sprout, FNP   Patient coming from: home   Chief Complaint: Fatigue, dyspnea  HPI: Jocelyn Wells is a 66 y.o. female with medical history significant for insulin-dependent diabetes mellitus, nocturnal supplemental oxygen use, chronic CHF, CKD stage IV, COPD, depression, and anemia who presents to the emergency department with fatigue and dyspnea.  Patient reports that she has been progressively more fatigued in recent days to weeks and her home health aide was concerned that she appeared to be having more difficulty breathing today.  She denies any melena, hematochezia, hematemesis, or other overt bleeding.  She denies cough and does not feel particularly short of breath.  She does report recent increase in her chronic bilateral leg swelling.    She follows with hematology and GI for her chronic anemia and states that she had red blood cell transfusion on 12/18/2021 and iron infusion on 12/28/2021.  She had been planned for endoscopy 01/04/2022 but had not stopped her Plavix or Eliquis, and so this has been rescheduled for January.  ED Course: Upon arrival to the ED, patient is found to be afebrile, requiring 2 L/min of supplemental oxygen to maintain saturations in the 90s, and with normal heart rate and stable blood pressure.  EKG demonstrates sinus rhythm with RBBB and LAFB.  Chest x-ray with stable cardiomegaly, mild vascular congestion, and question of bibasilar edema or atelectasis.  Blood work notable for hemoglobin 6.7 and creatinine 2.63.  RBC transfusion was ordered from the ED.  Review of Systems:  All other systems reviewed and apart from HPI, are negative.  Past Medical History:  Diagnosis Date   Anemia    Anxiety    Cardiomyopathy [facility])    new to her Jan 2017   CHF (congestive heart failure) (HCC)    Chronic kidney disease    COPD (chronic obstructive pulmonary disease)  (HCC)    Depression    Diabetes mellitus, type II ([place])    HTN (hypertension)    Leukocytosis    Obesity    Osteoporosis    PONV (postoperative nausea and vomiting)    Stroke [facility])    Jan 2017    Past Surgical History:  Procedure Laterality Date   ABDOMINAL HYSTERECTOMY     ANKLE FRACTURE SURGERY Left 02/09/2000   BILATERAL SALPINGOOPHORECTOMY  02/08/1998   CARDIAC CATHETERIZATION N/A 03/25/2015   Procedure: Right/Left Heart Cath and Coronary Angiography;  Surgeon: Belva Crome, MD;  Location: [place] CV LAB;  Service: Cardiovascular;  Laterality: N/A;   CESAREAN SECTION  02/09/1976   COLONOSCOPY WITH PROPOFOL N/A 09/22/2016   Procedure: COLONOSCOPY WITH PROPOFOL;  Surgeon: Manya Silvas, MD;  Location: [facility] ENDOSCOPY;  Service: Endoscopy;  Laterality: N/A;   EP IMPLANTABLE DEVICE N/A 08/05/2015   Procedure: Loop Recorder Insertion;  Surgeon: Deboraha Sprang, MD;  Location: [place] CV LAB;  Service: Cardiovascular;  Laterality: N/A;   ESOPHAGOGASTRODUODENOSCOPY (EGD) WITH PROPOFOL N/A 09/22/2016   Procedure: ESOPHAGOGASTRODUODENOSCOPY (EGD) WITH PROPOFOL;  Surgeon: Manya Silvas, MD;  Location: [facility] ENDOSCOPY;  Service: Endoscopy;  Laterality: N/A;   ESOPHAGOGASTRODUODENOSCOPY (EGD) WITH PROPOFOL N/A 12/08/2016   Procedure: ESOPHAGOGASTRODUODENOSCOPY (EGD) WITH PROPOFOL;  Surgeon: Manya Silvas, MD;  Location: [facility] ENDOSCOPY;  Service: Endoscopy;  Laterality: N/A;   FRACTURE SURGERY     KNEE ARTHROSCOPY Left 02/09/2003   LOWER EXTREMITY ANGIOGRAPHY Left 08/12/2021   Procedure: Lower Extremity Angiography;  Surgeon:  Schnier, Dolores Lory, MD;  Location: [place] CV LAB;  Service: Cardiovascular;  Laterality: Left;   TEE WITHOUT CARDIOVERSION N/A 01/31/2015   Procedure: TRANSESOPHAGEAL ECHOCARDIOGRAM (TEE);  Surgeon: Lelon Perla, MD;  Location: [facility] ENDOSCOPY;  Service: Cardiovascular;  Laterality: N/A;   TEMPORARY DIALYSIS CATHETER N/A 08/14/2021    Procedure: TEMPORARY DIALYSIS CATHETER;  Surgeon: Katha Cabal, MD;  Location: [place] [facility] LAB;  Service: Cardiovascular;  Laterality: N/A;   TUBAL LIGATION  02/09/1976   ULNAR NERVE TRANSPOSITION  02/08/2006    Social History:   reports that she has been smoking cigarettes. She started smoking about 47 years ago. She has a 23.50 pack-year smoking history. She has never used smokeless tobacco. She reports that she does not drink alcohol and does not use drugs.  Allergies  Allergen Reactions   Codeine Nausea And Vomiting   Ivp Dye [Iodinated Contrast Media]     Patients states the IV Dye shuts her kidneys down    Family History  Problem Relation Age of Onset   Heart disease Mother        died from CHF   Asthma Mother    Diabetes Mother    Heart disease Father    Aneurysm Father    COPD Brother    Diabetes Brother    Alcohol abuse Paternal Aunt    Cancer Maternal Grandmother        unknownorigin   Anemia Neg Hx    Arrhythmia Neg Hx    Clotting disorder Neg Hx    Fainting Neg Hx    Heart attack Neg Hx    Heart failure Neg Hx    Hyperlipidemia Neg Hx    Hypertension Neg Hx      Prior to Admission medications   Medication Sig Start Date End Date Taking? Authorizing Provider  apixaban (ELIQUIS) 5 MG TABS tablet Take 1 tablet (5 mg total) by mouth 2 (two) times daily. 12/16/20  Yes Schnier, Dolores Lory, MD  atorvastatin (LIPITOR) 40 MG tablet Take 1 tablet (40 mg total) by mouth daily. 09/08/21  Yes Gwyneth Sprout, FNP  buPROPion (WELLBUTRIN SR) 150 MG 12 hr tablet Take 1 tablet (150 mg total) by mouth 2 (two) times daily. 12/04/21  Yes Gwyneth Sprout, FNP  carvedilol (COREG) 12.5 MG tablet Take 1 tablet (12.5 mg total) by mouth 2 (two) times daily with a meal. 12/01/21  Yes Agbor-Etang, Aaron Edelman, MD  clopidogrel (PLAVIX) 75 MG tablet Take 1 tablet (75 mg total) by mouth daily. 10/23/21  Yes Hammock, Sheri, NP  fluticasone (FLONASE) 50 MCG/ACT nasal spray Place 2 sprays into  both nostrils daily. 02/13/21  Yes Gwyneth Sprout, FNP  gabapentin (NEURONTIN) 300 MG capsule Take 300 mg by mouth 2 (two) times daily.   Yes [provider]  insulin aspart (NOVOLOG) 100 UNIT/ML injection Insulin pump 05/26/20  Yes [provider]  loratadine (CLARITIN) 10 MG tablet Take 1 tablet by mouth daily.   Yes [provider]  losartan (COZAAR) 50 MG tablet Take 1 tablet (50 mg total) by mouth daily. 12/01/21  Yes Kate Sable, MD  meclizine (ANTIVERT) 12.5 MG tablet Take 1 tablet (12.5 mg total) by mouth 3 (three) times daily as needed for dizziness. 11/23/21  Yes Myles Gip, DO  pantoprazole (PROTONIX) 40 MG tablet Take 1 tablet (40 mg total) by mouth 2 (two) times daily. 08/23/21  Yes Sharen Hones, MD  PARoxetine (PAXIL) 40 MG tablet Take 1 tablet (40 mg  total) by mouth daily. 10/12/21 04/10/22 Yes Hisada, Elie Goody, MD  silver sulfADIAZINE (SILVADENE) 1 % cream Apply 1 Application topically daily. 08/28/21  Yes Kris Hartmann, NP  torsemide (DEMADEX) 20 MG tablet Take 20 mg by mouth daily.   Yes [provider]  traMADol (ULTRAM) 50 MG tablet Take 1 tablet (50 mg total) by mouth every 12 (twelve) hours as needed. 11/03/21 03/03/22 Yes Gillis Santa, MD  albuterol (VENTOLIN HFA) 108 (90 Base) MCG/ACT inhaler TAKE 2 PUFFS BY MOUTH EVERY 6 HOURS AS NEEDED FOR WHEEZE OR SHORTNESS OF BREATH Patient taking differently: Inhale 2 puffs into the lungs every 6 (six) hours as needed for wheezing or shortness of breath. 06/19/21   Gwyneth Sprout, FNP  Dulaglutide 3 MG/0.5ML SOPN Inject into the skin once a week. 04/17/21   [provider]  glucose blood test strip OneTouch Verio strips    [provider]  Insulin Disposable Pump (OMNIPOD DASH PODS, GEN 4,) MISC USE 1 POD EACH EVERY 72    HOURS 10/29/20   [provider]  Insulin Human (INSULIN PUMP) SOLN Per endocrinoloy 06/01/20   Elgergawy, Silver Huguenin, MD  Spacer/Aero-Holding Chambers  [facility] DIAMOND) MISC Use with inhaler to ensure medication is delivered throughout lungs 02/13/21   Gwyneth Sprout, FNP    Physical Exam: Vitals:   01/01/22 2007 01/01/22 2030 01/01/22 2045 01/01/22 2100  BP: (!) 171/85 (!) 157/78  (!) 165/74  Pulse: 81 77  78  Resp: (!) 22 (!) 32  (!) 27  Temp: 97.7 F (36.5 C)     TempSrc: Oral     SpO2:   96% 96%    Constitutional: NAD, no pallor or diaphoresis   Eyes: PERTLA, lids and conjunctivae normal ENMT: Mucous membranes are moist. Posterior pharynx clear of any exudate or lesions.   Neck: supple, no masses  Respiratory: no wheezing, no crackles. No accessory muscle use.  Cardiovascular: S1 & S2 heard, regular rate and rhythm. B/l LE edema. Abdomen: No distension, no tenderness, soft. Bowel sounds active.  Musculoskeletal: no clubbing / cyanosis. No joint deformity upper and lower extremities.   Skin: no significant rashes, lesions, ulcers. Warm, dry, well-perfused. Neurologic: CN 2-12 grossly intact. Moving all extremities. Alert and oriented.  Psychiatric: Calm. Cooperative.    Labs and Imaging on Admission: I have personally reviewed following labs and imaging studies  CBC: Recent Labs  Lab 01/01/22 1509  WBC 12.9*  HGB 6.7*  HCT 24.1*  MCV 82.3  PLT 720   Basic Metabolic Panel: Recent Labs  Lab 01/01/22 1509  NA 144  K 4.5  CL 114*  CO2 23  GLUCOSE 79  BUN 29*  CREATININE 2.63*  CALCIUM 8.1*   GFR: Estimated Creatinine Clearance: 26.4 mL/min (A) (by C-G formula based on SCr of 2.63 mg/dL (H)). Liver Function Tests: No results for input(s): "AST", "ALT", "ALKPHOS", "BILITOT", "PROT", "ALBUMIN" in the last 168 hours. No results for input(s): "LIPASE", "AMYLASE" in the last 168 hours. No results for input(s): "AMMONIA" in the last 168 hours. Coagulation Profile: No results for input(s): "INR", "PROTIME" in the last 168 hours. Cardiac Enzymes: No results for input(s): "CKTOTAL", "CKMB", "CKMBINDEX",  "TROPONINI" in the last 168 hours. BNP (last 3 results) No results for input(s): "PROBNP" in the last 8760 hours. HbA1C: No results for input(s): "HGBA1C" in the last 72 hours. CBG: No results for input(s): "GLUCAP" in the last 168 hours. Lipid Profile: No results for input(s): "CHOL", "HDL", "LDLCALC", "TRIG", "CHOLHDL", "  LDLDIRECT" in the last 72 hours. Thyroid Function Tests: No results for input(s): "TSH", "T4TOTAL", "FREET4", "T3FREE", "THYROIDAB" in the last 72 hours. Anemia Panel: No results for input(s): "VITAMINB12", "FOLATE", "FERRITIN", "TIBC", "IRON", "RETICCTPCT" in the last 72 hours. Urine analysis:    Component Value Date/Time   COLORURINE STRAW (A) 05/31/2020 0707   APPEARANCEUR CLEAR (A) 05/31/2020 0707   LABSPEC 1.017 05/31/2020 0707   PHURINE 5.0 05/31/2020 0707   GLUCOSEU >=500 (A) 05/31/2020 0707   HGBUR MODERATE (A) 05/31/2020 0707   BILIRUBINUR NEGATIVE 05/31/2020 0707   KETONESUR NEGATIVE 05/31/2020 0707   PROTEINUR 100 (A) 05/31/2020 0707   NITRITE NEGATIVE 05/31/2020 0707   LEUKOCYTESUR NEGATIVE 05/31/2020 0707   Sepsis Labs: '@LABRCNTIP'$ (procalcitonin:4,lacticidven:4) )No results found for this or any previous visit (from the past 240 hour(s)).   Radiological Exams on Admission: DG Chest 2 View  Result Date: 01/01/2022 CLINICAL DATA:  Shortness of breath, chest pain. EXAM: CHEST - 2 VIEW COMPARISON:  August 14, 2021. FINDINGS: Stable cardiomegaly. Mild central pulmonary vascular congestion is noted. Possible bibasilar pulmonary edema or atelectasis is noted. Bony thorax is unremarkable. IMPRESSION: Stable cardiomegaly with mild central pulmonary vascular congestion and possible bibasilar pulmonary edema or atelectasis. Electronically Signed   By: Marijo Conception M.D.   On: 01/01/2022 15:28    EKG: Independently reviewed. Sinus rhythm, RBBB, LAFB.   Assessment/Plan   1. Symptomatic anemia  - Hgb 6.7 without overt bleeding  - She had recent workup with  hematology suspects her anemia is primarily d/t iron-deficiency with component from CKD; she is scheduled for outpatient endoscopy  - RBC transfusion started in ED  - Reassess after 1 unit RBC and consider transfusing a second, continue GI and hematology follow-up as planned after discharge    2. Acute on chronic HFmrEF  - Reports recent increase in bilateral LE edema and found to have CHF findings on CXR and increased BNP  - EF was 45-50% on TTE from August  - Diurese with 40 mg IV Lasix q12h, monitor wt and I/Os, continue Coreg and losartan    3. Insulin-dependent DM  - A1c was 10.2% in October 2023  - Continue insulin pump    4. CKD IV  - SCr is 2.63 on admission, appears close to baseline  - Renally-dose medications, monitor    5. Depression  - Continue Wellbutrin and Paxil    DVT prophylaxis: Eliquis  Code Status: Full  Level of Care: Level of care: Med-Surg Family Communication: none present  Disposition Plan:  Patient is from: home  Anticipated d/c is to: home  Anticipated d/c date is: 11/25 or 01/03/22  Patient currently: Pending post-transfusion CBC, stable/improved respiratory status  Consults called: none  Admission status: Observation     Vianne Bulls, MD [facility]  01/01/2022, 9:44 PM

## 2022-01-01 NOTE — ED Provider Triage Note (Signed)
Emergency Medicine Provider Triage Evaluation Note  Jocelyn Wells , a 66 y.o. female  was evaluated in triage.  Pt complains of increased respiratory effort and bilateral lower leg edema worse than usual per home health nurse. Patient denies chest pain or worsening shortness of breath. 88% on room air per EMS, over 90% on 2 liters. Typically uses oxygen only at night.  Physical Exam  There were no vitals taken for this visit. Gen:   Awake, no distress   Resp:  Normal effort  MSK:   Moves extremities without difficulty  Other:  Bilateral LE pitting edema with dressing over wounds on both legs-- appears clean.  Medical Decision Making  Medically screening exam initiated at 3:01 PM.  Appropriate orders placed.  Jocelyn Wells was informed that the remainder of the evaluation will be completed by another provider, this initial triage assessment does not replace that evaluation, and the importance of remaining in the ED until their evaluation is complete.   Victorino Dike, FNP 01/01/22 1506

## 2022-01-01 NOTE — ED Triage Notes (Signed)
Pt to ED via [facility] from home. Pt reports her home aide came to check on her and thought her breathing was more labored than usual. Pt RA sats 88%. Pt placed on 2L [place]. Pt with hx COPD, CHF, afib and DM. Pt reports she only uses oxygen at night. Pt denies CP. Pt reports increased swelling in legs.

## 2022-01-01 NOTE — ED Provider Notes (Signed)
[facility] Provider Note    Event Date/Time   First MD Initiated Contact with Patient 01/01/22 1712     (approximate)   History   Shortness of Breath   HPI  Jocelyn Wells is a 66 y.o. female with a history of anemia, CKD, hypertension, hyperlipidemia, and COPD presents with generalized weakness over the last few days associated with increased shortness of breath and fatigue.  The patient's home nurse felt that she did not look well today and instructed her to come for evaluation.  The patient denies any abnormal bleeding or bruising.  She has no cough or fever.  She denies any chest pain.  I reviewed the past medical records.  The patient was just evaluated via telemedicine by Dr. Janese Banks from hematology for leukocytosis and anemia that was noted on recent blood work.  Hemoglobin from 1 month ago was 8 with a normal MCV.  The patient is on Plavix and Eliquis.  She is scheduled for an EGD and colonoscopy.  The patient reports that she actually was transfused and got an iron infusion last week.  Physical Exam   Triage Vital Signs: ED Triage Vitals  Enc Vitals Group     BP 01/01/22 1507 (!) 164/86     Pulse Rate 01/01/22 1507 80     Resp 01/01/22 1507 18     Temp 01/01/22 1507 98.1 F (36.7 C)     Temp Source 01/01/22 1507 Oral     SpO2 01/01/22 1507 93 %     Weight --      Height --      Head Circumference --      Peak Flow --      Pain Score 01/01/22 1508 4     Pain Loc --      Pain Edu? --      Excl. in [place]? --     Most recent vital signs: Vitals:   01/01/22 2100 01/01/22 2200  BP: (!) 165/74 (!) 145/60  Pulse: 78 88  Resp: (!) 27 (!) 26  Temp:    SpO2: 96% 97%     General: Alert and oriented, weak appearing but in no acute distress. CV:  Good peripheral perfusion.  Resp:  Normal effort.  Lungs CTAB. Abd:  No distention.  Other:  [place].   ED Results / Procedures / Treatments   Labs (all labs ordered are listed, but only abnormal  results are displayed) Labs Reviewed  BASIC METABOLIC PANEL - Abnormal; Notable for the following components:      Result Value   Chloride 114 (*)    BUN 29 (*)    Creatinine, Ser 2.63 (*)    Calcium 8.1 (*)    GFR, Estimated 19 (*)    All other components within normal limits  CBC - Abnormal; Notable for the following components:   WBC 12.9 (*)    RBC 2.93 (*)    Hemoglobin 6.7 (*)    HCT 24.1 (*)    MCH 22.9 (*)    MCHC 27.8 (*)    RDW 19.9 (*)    All other components within normal limits  URINALYSIS, ROUTINE W REFLEX MICROSCOPIC - Abnormal; Notable for the following components:   Color, Urine YELLOW (*)    APPearance HAZY (*)    Glucose, UA 50 (*)    Hgb urine dipstick SMALL (*)    Protein, ur >=300 (*)    Bacteria, UA RARE (*)    All  other components within normal limits  BRAIN NATRIURETIC PEPTIDE - Abnormal; Notable for the following components:   B Natriuretic Peptide 2,508.4 (*)    All other components within normal limits  TROPONIN I (HIGH SENSITIVITY) - Abnormal; Notable for the following components:   Troponin I (High Sensitivity) 18 (*)    All other components within normal limits  TROPONIN I (HIGH SENSITIVITY) - Abnormal; Notable for the following components:   Troponin I (High Sensitivity) 18 (*)    All other components within normal limits  HIV ANTIBODY (ROUTINE TESTING W REFLEX)  BASIC METABOLIC PANEL  MAGNESIUM  TYPE AND SCREEN  PREPARE RBC (CROSSMATCH)     EKG  ED ECG REPORT I, Arta Silence, the attending physician, personally viewed and interpreted this ECG.  Date: 01/01/2022 EKG Time: 1500 Rate: 76 Rhythm: normal sinus rhythm QRS Axis: normal Intervals: RBBB, LAFB ST/T Wave abnormalities: Nonspecific T wave abnormalities Narrative Interpretation: no evidence of acute ischemia; no significant change when compared to EKG of 12/07/2021    RADIOLOGY  Chest x-ray: I independently viewed and interpreted the images; there is some  vascular congestion with no focal consolidation or edema  PROCEDURES:  Critical Care performed: No  Procedures   MEDICATIONS ORDERED IN ED: Medications  traMADol (ULTRAM) tablet 50 mg (has no administration in time range)  atorvastatin (LIPITOR) tablet 40 mg (40 mg Oral Given 01/01/22 2207)  carvedilol (COREG) tablet 12.5 mg (has no administration in time range)  losartan (COZAAR) tablet 50 mg (50 mg Oral Given 01/01/22 2207)  buPROPion (WELLBUTRIN SR) 12 hr tablet 150 mg (has no administration in time range)  PARoxetine (PAXIL) tablet 40 mg (has no administration in time range)  meclizine (ANTIVERT) tablet 12.5 mg (has no administration in time range)  pantoprazole (PROTONIX) EC tablet 40 mg (40 mg Oral Given 01/01/22 2207)  apixaban (ELIQUIS) tablet 5 mg (5 mg Oral Given 01/01/22 2207)  clopidogrel (PLAVIX) tablet 75 mg (75 mg Oral Given 01/01/22 2206)  gabapentin (NEURONTIN) capsule 300 mg (300 mg Oral Given 01/01/22 2207)  albuterol (PROVENTIL) (2.5 MG/3ML) 0.083% nebulizer solution 2.5 mg (has no administration in time range)  silver sulfADIAZINE (SILVADENE) 1 % cream 1 Application (has no administration in time range)  acetaminophen (TYLENOL) tablet 650 mg (has no administration in time range)    Or  acetaminophen (TYLENOL) suppository 650 mg (has no administration in time range)  senna-docusate (Senokot-S) tablet 1 tablet (has no administration in time range)  furosemide (LASIX) injection 40 mg (has no administration in time range)  insulin pump (has no administration in time range)  furosemide (LASIX) injection 40 mg (has no administration in time range)  0.9 %  sodium chloride infusion (10 mL/hr Intravenous New Bag/Given 01/01/22 1952)     IMPRESSION / MDM / [place] / ED COURSE  I reviewed the triage vital signs and the nursing notes.  66 year old female with PMH as noted above presents with increased generalized weakness and shortness of breath over the  last several days.  On physical exam her vital signs are normal.  She is pale appearing.  Initial lab work-up reveals hemoglobin of 6.7 with mild leukocytosis.  Creatinine is elevated but is at her baseline.  Differential diagnosis includes, but is not limited to, iron deficiency anemia, anemia of chronic disease, less likely blood loss given the patient denies any symptoms of bleeding, versus other etiology of fatigue and weakness such as UTI or other infection.  We will obtain additional labs, transfuse 2  units of blood, and reassess.  Patient's presentation is most consistent with acute presentation with potential threat to life or bodily function.  The patient is on the cardiac monitor to evaluate for evidence of arrhythmia and/or significant heart rate changes.  ----------------------------------------- 8:19 PM on 01/01/2022 -----------------------------------------  Due to the patient's low hemoglobin and need for likely multiple units of blood with reassessment and monitoring, I consulted Dr. Myna Hidalgo from the hospitalist service; based on our discussion he agrees to admit the patient.   FINAL CLINICAL IMPRESSION(S) / ED DIAGNOSES   Final diagnoses:  Symptomatic anemia     Rx / DC Orders   ED Discharge Orders     None        Note:  This document was prepared using Dragon voice recognition software and may include unintentional dictation errors.    Arta Silence, MD 01/01/22 2253

## 2022-01-01 NOTE — ED Triage Notes (Signed)
First Nurse: Pt here via [facility] from home with SOB. Pt has a hx of COPD, CHF, a fib, and type 2 DM. Pt had 1 albuterol.  94% 2L 78 45-co2 154/76 RBB-new 130-cbg

## 2022-01-02 DIAGNOSIS — I452 Bifascicular block: Secondary | ICD-10-CM | POA: Diagnosis present

## 2022-01-02 DIAGNOSIS — I129 Hypertensive chronic kidney disease with stage 1 through stage 4 chronic kidney disease, or unspecified chronic kidney disease: Secondary | ICD-10-CM | POA: Diagnosis not present

## 2022-01-02 DIAGNOSIS — E1165 Type 2 diabetes mellitus with hyperglycemia: Secondary | ICD-10-CM | POA: Diagnosis present

## 2022-01-02 DIAGNOSIS — Z9981 Dependence on supplemental oxygen: Secondary | ICD-10-CM | POA: Diagnosis not present

## 2022-01-02 DIAGNOSIS — I152 Hypertension secondary to endocrine disorders: Secondary | ICD-10-CM | POA: Diagnosis present

## 2022-01-02 DIAGNOSIS — Z9641 Presence of insulin pump (external) (internal): Secondary | ICD-10-CM | POA: Diagnosis present

## 2022-01-02 DIAGNOSIS — E1140 Type 2 diabetes mellitus with diabetic neuropathy, unspecified: Secondary | ICD-10-CM | POA: Diagnosis present

## 2022-01-02 DIAGNOSIS — N184 Chronic kidney disease, stage 4 (severe): Secondary | ICD-10-CM | POA: Diagnosis present

## 2022-01-02 DIAGNOSIS — N179 Acute kidney failure, unspecified: Secondary | ICD-10-CM | POA: Diagnosis present

## 2022-01-02 DIAGNOSIS — D649 Anemia, unspecified: Secondary | ICD-10-CM | POA: Diagnosis present

## 2022-01-02 DIAGNOSIS — F17210 Nicotine dependence, cigarettes, uncomplicated: Secondary | ICD-10-CM | POA: Diagnosis present

## 2022-01-02 DIAGNOSIS — F330 Major depressive disorder, recurrent, mild: Secondary | ICD-10-CM | POA: Diagnosis present

## 2022-01-02 DIAGNOSIS — D631 Anemia in chronic kidney disease: Secondary | ICD-10-CM | POA: Diagnosis present

## 2022-01-02 DIAGNOSIS — Z8673 Personal history of transient ischemic attack (TIA), and cerebral infarction without residual deficits: Secondary | ICD-10-CM | POA: Diagnosis not present

## 2022-01-02 DIAGNOSIS — Z794 Long term (current) use of insulin: Secondary | ICD-10-CM | POA: Diagnosis not present

## 2022-01-02 DIAGNOSIS — J9621 Acute and chronic respiratory failure with hypoxia: Secondary | ICD-10-CM | POA: Diagnosis present

## 2022-01-02 DIAGNOSIS — D509 Iron deficiency anemia, unspecified: Secondary | ICD-10-CM | POA: Diagnosis present

## 2022-01-02 DIAGNOSIS — E7800 Pure hypercholesterolemia, unspecified: Secondary | ICD-10-CM | POA: Diagnosis present

## 2022-01-02 DIAGNOSIS — R809 Proteinuria, unspecified: Secondary | ICD-10-CM | POA: Diagnosis not present

## 2022-01-02 DIAGNOSIS — J449 Chronic obstructive pulmonary disease, unspecified: Secondary | ICD-10-CM | POA: Diagnosis present

## 2022-01-02 DIAGNOSIS — E1122 Type 2 diabetes mellitus with diabetic chronic kidney disease: Secondary | ICD-10-CM | POA: Diagnosis present

## 2022-01-02 DIAGNOSIS — N2581 Secondary hyperparathyroidism of renal origin: Secondary | ICD-10-CM | POA: Diagnosis present

## 2022-01-02 DIAGNOSIS — Z95828 Presence of other vascular implants and grafts: Secondary | ICD-10-CM | POA: Diagnosis not present

## 2022-01-02 DIAGNOSIS — I5033 Acute on chronic diastolic (congestive) heart failure: Secondary | ICD-10-CM | POA: Diagnosis present

## 2022-01-02 DIAGNOSIS — Z79899 Other long term (current) drug therapy: Secondary | ICD-10-CM | POA: Diagnosis not present

## 2022-01-02 DIAGNOSIS — I429 Cardiomyopathy, unspecified: Secondary | ICD-10-CM | POA: Diagnosis present

## 2022-01-02 LAB — BASIC METABOLIC PANEL
Anion gap: 8 (ref 5–15)
BUN: 29 mg/dL — ABNORMAL HIGH (ref 8–23)
CO2: 23 mmol/L (ref 22–32)
Calcium: 8 mg/dL — ABNORMAL LOW (ref 8.9–10.3)
Chloride: 111 mmol/L (ref 98–111)
Creatinine, Ser: 2.62 mg/dL — ABNORMAL HIGH (ref 0.44–1.00)
GFR, Estimated: 19 mL/min — ABNORMAL LOW (ref >60)
Glucose, Bld: 157 mg/dL — ABNORMAL HIGH (ref 70–99)
Potassium: 4.6 mmol/L (ref 3.5–5.1)
Sodium: 142 mmol/L (ref 135–145)

## 2022-01-02 LAB — CBC
HCT: 29.6 % — ABNORMAL LOW (ref 36.0–46.0)
Hemoglobin: 8.5 g/dL — ABNORMAL LOW (ref 12.0–15.0)
MCH: 23.1 pg — ABNORMAL LOW (ref 26.0–34.0)
MCHC: 28.7 g/dL — ABNORMAL LOW (ref 30.0–36.0)
MCV: 80.4 fL (ref 80.0–100.0)
Platelets: 435 10*3/uL — ABNORMAL HIGH (ref 150–400)
RBC: 3.68 MIL/uL — ABNORMAL LOW (ref 3.87–5.11)
RDW: 19.5 % — ABNORMAL HIGH (ref 11.5–15.5)
WBC: 15.7 10*3/uL — ABNORMAL HIGH (ref 4.0–10.5)
nRBC: 0 % (ref 0.0–0.2)

## 2022-01-02 LAB — BPAM RBC
Blood Product Expiration Date: 202312222359
ISSUE DATE / TIME: 202311241939
Unit Type and Rh: 6200

## 2022-01-02 LAB — TYPE AND SCREEN
ABO/RH(D): A POS
Antibody Screen: NEGATIVE
Unit division: 0

## 2022-01-02 LAB — GLUCOSE, CAPILLARY
Glucose-Capillary: 206 mg/dL — ABNORMAL HIGH (ref 70–99)
Glucose-Capillary: 139 mg/dL — ABNORMAL HIGH (ref 70–99)
Glucose-Capillary: 241 mg/dL — ABNORMAL HIGH (ref 70–99)
Glucose-Capillary: 121 mg/dL — ABNORMAL HIGH (ref 70–99)

## 2022-01-02 LAB — MAGNESIUM: Magnesium: 1.5 mg/dL — ABNORMAL LOW (ref 1.7–2.4)

## 2022-01-02 LAB — HIV ANTIBODY (ROUTINE TESTING W REFLEX): HIV Screen 4th Generation wRfx: NONREACTIVE

## 2022-01-02 MED ORDER — FUROSEMIDE 10 MG/ML IJ SOLN
60.0000 mg | Freq: Two times a day (BID) | INTRAMUSCULAR | Status: DC
  Administered 2022-01-02 (×2): 60 mg via INTRAVENOUS
  Filled 2022-01-02 (×2): qty 6

## 2022-01-02 MED ORDER — MAGNESIUM SULFATE 2 GM/50ML IV SOLN
2.0000 g | Freq: Once | INTRAVENOUS | Status: AC
  Administered 2022-01-02: 2 g via INTRAVENOUS
  Filled 2022-01-02: qty 50

## 2022-01-02 MED ORDER — CARVEDILOL 25 MG PO TABS
25.0000 mg | ORAL_TABLET | Freq: Two times a day (BID) | ORAL | Status: DC
  Administered 2022-01-02 – 2022-01-04 (×5): 25 mg via ORAL
  Filled 2022-01-02 (×5): qty 1

## 2022-01-02 NOTE — ED Notes (Signed)
Request made for transport to the floor ?

## 2022-01-02 NOTE — Progress Notes (Signed)
PROGRESS NOTE    Jocelyn Wells  XQJ:[id number] DOB: [birth year] DOA: 01/01/2022 PCP: Gwyneth Sprout, FNP   Brief Narrative:  Jocelyn Wells is a 66 y.o. female with medical history significant for insulin-dependent diabetes mellitus, nocturnal supplemental oxygen use, chronic CHF, CKD stage IV, COPD, depression, and anemia who presents to the emergency department with fatigue and dyspnea.   Patient reports that she has been progressively more fatigued in recent days to weeks and her home health aide was concerned that she appeared to be having more difficulty breathing today.  She denies any melena, hematochezia, hematemesis, or other overt bleeding.  She denies cough and does not feel particularly short of breath.  She does report recent increase in her chronic bilateral leg swelling.     She follows with hematology and GI for her chronic anemia and states that she had red blood cell transfusion on 12/18/2021 and iron infusion on 12/28/2021.  She had been planned for endoscopy 01/04/2022 but had not stopped her Plavix or Eliquis, and so this has been rescheduled for January.   ED Course: Upon arrival to the ED, patient is found to be afebrile, requiring 2 L/min of supplemental oxygen to maintain saturations in the 90s, and with normal heart rate and stable blood pressure.  EKG demonstrates sinus rhythm with RBBB and LAFB.  Chest x-ray with stable cardiomegaly, mild vascular congestion, and question of bibasilar edema or atelectasis.  Blood work notable for hemoglobin 6.7 and creatinine 2.63.   RBC transfusion was ordered from the ED.  Assessment & Plan:   Symptomatic anemia  - Hgb 6.7 without overt bleeding upon admission. - She had recent workup with hematology suspects her anemia is primarily d/t iron-deficiency with component from CKD; she is scheduled for outpatient endoscopy  -S/p 1 unit PRBC transfusion.  H&H improved to 8.5/29.6. -Recommend to continue GI and hematology follow-up as  planned after discharge   -Continue to monitor H&H closely.   Acute on chronic diastolic heart failure: - Reports recent increase in bilateral LE edema and found to have CHF findings on CXR and increased BNP  - EF was 45-50% on TTE from August  -Continue Lasix 60 IV twice daily.  Strict INO's and daily weight.  Monitor kidney function closely.  Continue losartan and Coreg.  Uncontrolled hypertension: -Will continue current dose of losartan.  Will go up on Coreg dose from 12.5 to 25 mg twice daily.  Continue to monitor blood pressure closely.   Uncontrolled insulin-dependent DM  - A1c was 10.2% in October 2023  - Continue insulin pump   -Monitor blood sugar closely.   CKD IV  - SCr is 2.63 on admission, appears close to baseline  - Renally-dose medications, monitor in the setting of diuresis.   Major depression  - Continue Wellbutrin and Paxil   Diabetic neuropathy: Continue gabapentin.  Chronic hypoxic respiratory failure History of COPD: -Patient uses oxygen at bedtime 2 L via nasal cannula. -No wheezing noted on exam.  Continue DuoNebs as needed  Hypercholesterolemia: Continue atorvastatin  History of CVA: Continue statin and Plavix.  RLE chronic wound: -seen by wound care -has wound care nurse visit at home   Hx of chronic Right lower extremity DVT: on eliquis  Right knee pain and swelling: -X-ray of knee from 12/23/2021 shows hematoma.  She denies any worsening of pain at this time. -Continue tramadol as needed.  Hypomagnesemia: Replenished.  Repeat magnesium level tomorrow a.m.  DVT prophylaxis: Eliquis Code Status: Full code  Family Communication:  None present at bedside.  Plan of care discussed with patient in length and she verbalized understanding and agreed with it. Disposition Plan: To be determined  Consultants:  None  Procedures:  None  Antimicrobials:  None  Status is: Observation   Subjective: Patient seen and examined.  On nasal cannula.   Lying comfortably on the bed.  Reports improvement in breathing.  Continues to have leg swelling.  Denies chest pain, fever, chills, sputum production.  Uses oxygen at bedtime due to history of COPD at home.  Denies any wheezing today.  Objective: Vitals:   01/02/22 0229 01/02/22 0330 01/02/22 0440 01/02/22 0725  BP: (!) 175/79 (!) 185/93 (!) 183/87 (!) 182/83  Pulse: 87  85 84  Resp: (!) '21  18 17  '$ Temp: 98.7 F (37.1 C)  98.6 F (37 C) 98.9 F (37.2 C)  TempSrc:      SpO2: 93%  94% 95%    Intake/Output Summary (Last 24 hours) at 01/02/2022 1031 Last data filed at 01/02/2022 3716 Gross per 24 hour  Intake 340 ml  Output 1600 ml  Net -1260 ml   There were no vitals filed for this visit.  Examination:  General exam: Appears calm and comfortable, slightly tachypneic.  On nasal cannula. Respiratory system: Clear to auscultation.  Decree breath sounds noted on the bases.   Cardiovascular system: S1 & S2 heard, RRR. No JVD, murmurs, rubs, gallops or clicks.  Bilateral pitting edema positive.  Right leg dressing dry and intact.   Gastrointestinal system: Abdomen is nondistended, soft and nontender. No organomegaly or masses felt. Normal bowel sounds heard. Central nervous system: Alert and oriented. No focal neurological deficits. Extremities: Right knee: Swollen and bruises noted on anterior aspect.  Some tenderness noted.  Right lower extremity: Dressing dry and intact-tender on palpation. Skin: No rashes, lesions or ulcers Psychiatry: Judgement and insight appear normal. Mood & affect appropriate.    Data Reviewed: I have personally reviewed following labs and imaging studies  CBC: Recent Labs  Lab 01/01/22 1509 01/02/22 0459  WBC 12.9* 15.7*  HGB 6.7* 8.5*  HCT 24.1* 29.6*  MCV 82.3 80.4  PLT 389 967*   Basic Metabolic Panel: Recent Labs  Lab 01/01/22 1509 01/02/22 0500  NA 144 142  K 4.5 4.6  CL 114* 111  CO2 23 23  GLUCOSE 79 157*  BUN 29* 29*  CREATININE  2.63* 2.62*  CALCIUM 8.1* 8.0*  MG  --  1.5*   GFR: Estimated Creatinine Clearance: 26.5 mL/min (A) (by C-G formula based on SCr of 2.62 mg/dL (H)). Liver Function Tests: No results for input(s): "AST", "ALT", "ALKPHOS", "BILITOT", "PROT", "ALBUMIN" in the last 168 hours. No results for input(s): "LIPASE", "AMYLASE" in the last 168 hours. No results for input(s): "AMMONIA" in the last 168 hours. Coagulation Profile: No results for input(s): "INR", "PROTIME" in the last 168 hours. Cardiac Enzymes: No results for input(s): "CKTOTAL", "CKMB", "CKMBINDEX", "TROPONINI" in the last 168 hours. BNP (last 3 results) No results for input(s): "PROBNP" in the last 8760 hours. HbA1C: No results for input(s): "HGBA1C" in the last 72 hours. CBG: Recent Labs  Lab 01/01/22 2303 01/02/22 0222 01/02/22 0726  GLUCAP 161* 206* 139*   Lipid Profile: No results for input(s): "CHOL", "HDL", "LDLCALC", "TRIG", "CHOLHDL", "LDLDIRECT" in the last 72 hours. Thyroid Function Tests: No results for input(s): "TSH", "T4TOTAL", "FREET4", "T3FREE", "THYROIDAB" in the last 72 hours. Anemia Panel: No results for input(s): "VITAMINB12", "FOLATE", "FERRITIN", "TIBC", "IRON", "  RETICCTPCT" in the last 72 hours. Sepsis Labs: No results for input(s): "PROCALCITON", "LATICACIDVEN" in the last 168 hours.  No results found for this or any previous visit (from the past 240 hour(s)).    Radiology Studies: DG Chest 2 View  Result Date: 01/01/2022 CLINICAL DATA:  Shortness of breath, chest pain. EXAM: CHEST - 2 VIEW COMPARISON:  August 14, 2021. FINDINGS: Stable cardiomegaly. Mild central pulmonary vascular congestion is noted. Possible bibasilar pulmonary edema or atelectasis is noted. Bony thorax is unremarkable. IMPRESSION: Stable cardiomegaly with mild central pulmonary vascular congestion and possible bibasilar pulmonary edema or atelectasis. Electronically Signed   By: Marijo Conception M.D.   On: 01/01/2022 15:28     Scheduled Meds:  apixaban  5 mg Oral BID   atorvastatin  40 mg Oral Daily   buPROPion  150 mg Oral BID   carvedilol  25 mg Oral BID WC   clopidogrel  75 mg Oral Daily   furosemide  60 mg Intravenous BID   gabapentin  300 mg Oral BID   insulin pump   Subcutaneous TID WC, HS, 0200   losartan  50 mg Oral Daily   pantoprazole  40 mg Oral BID   PARoxetine  40 mg Oral Daily   silver sulfADIAZINE  1 Application Topical Daily   Continuous Infusions:   LOS: 0 days   Time spent: 38 minutes   Delylah Stanczyk Loann Quill, MD [facility]  If 7PM-7AM, please contact night-coverage [email or web address] 01/02/2022, 10:31 AM

## 2022-01-02 NOTE — Progress Notes (Signed)
pt refused dressing changes with silvadene stating that nurse lastnight changed them

## 2022-01-02 NOTE — Consult Note (Signed)
WOC Nurse Consult Note: Reason for Consult:Bilateral anterior LE (pretibial) wounds Wound type:partial thickness Pressure Injury POA: N/A Measurement:To be obtained by Bedside RN and documented on  Nursing Flow Sheet with application of first dressing today Wound bed:red, dry Drainage (amount, consistency, odor) none Periwound:intact, dry Dressing procedure/placement/frequency:I have provided Nursing with guidance for the care of these lesions using a daily cleanse with NS, and pat dry followed by covering with folded layers of size appropriate piece of xeroform gauze (antimicrobial nonadherent). This is to be covered with dry gauze and secured with silicone foam dressings.  [place] nursing team will not follow, but will remain available to this patient, the nursing and medical teams.  Please re-consult if needed.  Thank you for inviting [place] to participate in this patient's Plan of Care.  Maudie Flakes, MSN, RN, CNS, [place], Serita Grammes, [employer], [employer] phone:  [phone]

## 2022-01-03 DIAGNOSIS — D649 Anemia, unspecified: Secondary | ICD-10-CM | POA: Diagnosis not present

## 2022-01-03 LAB — BASIC METABOLIC PANEL
Anion gap: 8 (ref 5–15)
BUN: 33 mg/dL — ABNORMAL HIGH (ref 8–23)
CO2: 25 mmol/L (ref 22–32)
Calcium: 8 mg/dL — ABNORMAL LOW (ref 8.9–10.3)
Chloride: 109 mmol/L (ref 98–111)
Creatinine, Ser: 2.84 mg/dL — ABNORMAL HIGH (ref 0.44–1.00)
GFR, Estimated: 18 mL/min — ABNORMAL LOW (ref >60)
Glucose, Bld: 150 mg/dL — ABNORMAL HIGH (ref 70–99)
Potassium: 4.5 mmol/L (ref 3.5–5.1)
Sodium: 142 mmol/L (ref 135–145)

## 2022-01-03 LAB — CBC
HCT: 29 % — ABNORMAL LOW (ref 36.0–46.0)
Hemoglobin: 8.2 g/dL — ABNORMAL LOW (ref 12.0–15.0)
MCH: 22.9 pg — ABNORMAL LOW (ref 26.0–34.0)
MCHC: 28.3 g/dL — ABNORMAL LOW (ref 30.0–36.0)
MCV: 81 fL (ref 80.0–100.0)
Platelets: 383 10*3/uL (ref 150–400)
RBC: 3.58 MIL/uL — ABNORMAL LOW (ref 3.87–5.11)
RDW: 19.7 % — ABNORMAL HIGH (ref 11.5–15.5)
WBC: 13.2 10*3/uL — ABNORMAL HIGH (ref 4.0–10.5)
nRBC: 0 % (ref 0.0–0.2)

## 2022-01-03 LAB — MAGNESIUM: Magnesium: 1.8 mg/dL (ref 1.7–2.4)

## 2022-01-03 LAB — GLUCOSE, CAPILLARY
Glucose-Capillary: 148 mg/dL — ABNORMAL HIGH (ref 70–99)
Glucose-Capillary: 141 mg/dL — ABNORMAL HIGH (ref 70–99)
Glucose-Capillary: 151 mg/dL — ABNORMAL HIGH (ref 70–99)
Glucose-Capillary: 176 mg/dL — ABNORMAL HIGH (ref 70–99)
Glucose-Capillary: 227 mg/dL — ABNORMAL HIGH (ref 70–99)

## 2022-01-03 MED ORDER — MAGNESIUM SULFATE 2 GM/50ML IV SOLN
2.0000 g | Freq: Once | INTRAVENOUS | Status: AC
  Administered 2022-01-03: 2 g via INTRAVENOUS
  Filled 2022-01-03: qty 50

## 2022-01-03 MED ORDER — FUROSEMIDE 10 MG/ML IJ SOLN
40.0000 mg | Freq: Two times a day (BID) | INTRAMUSCULAR | Status: DC
  Administered 2022-01-03 – 2022-01-04 (×3): 40 mg via INTRAVENOUS
  Filled 2022-01-03 (×3): qty 4

## 2022-01-03 NOTE — Progress Notes (Addendum)
PROGRESS NOTE    [name]  FTD:[id number] DOB: Jun 11, 1955 DOA: 01/01/2022 PCP: Gwyneth Sprout, FNP   Brief Narrative:  Jocelyn Wells is a 66 y.o. female with medical history significant for insulin-dependent diabetes mellitus, nocturnal supplemental oxygen use, chronic CHF, CKD stage IV, COPD, depression, and anemia who presents to the emergency department with fatigue and dyspnea.   Patient reports that she has been progressively more fatigued in recent days to weeks and her home health aide was concerned that she appeared to be having more difficulty breathing today.  She denies any melena, hematochezia, hematemesis, or other overt bleeding.  She denies cough and does not feel particularly short of breath.  She does report recent increase in her chronic bilateral leg swelling.     She follows with hematology and GI for her chronic anemia and states that she had red blood cell transfusion on 12/18/2021 and iron infusion on 12/28/2021.  She had been planned for endoscopy 01/04/2022 but had not stopped her Plavix or Eliquis, and so this has been rescheduled for January.   ED Course: Upon arrival to the ED, patient is found to be afebrile, requiring 2 L/min of supplemental oxygen to maintain saturations in the 90s, and with normal heart rate and stable blood pressure.  EKG demonstrates sinus rhythm with RBBB and LAFB.  Chest x-ray with stable cardiomegaly, mild vascular congestion, and question of bibasilar edema or atelectasis.  Blood work notable for hemoglobin 6.7 and creatinine 2.63.   RBC transfusion was ordered from the ED.  Assessment & Plan:   Symptomatic anemia  - Hgb 6.7 without overt bleeding upon admission. - She had recent workup with hematology suspects her anemia is primarily d/t iron-deficiency with component from CKD; she is scheduled for outpatient endoscopy in January -S/p 1 unit PRBC transfusion.  H&H improved to 8. -Recommend to continue GI and hematology follow-up  as planned after discharge   -H&H remained stable.  Continue to monitor H&H closely.   Acute on chronic diastolic heart failure: - Reports recent increase in bilateral LE edema and found to have CHF findings on CXR and increased BNP  - EF was 45-50% on TTE from August  -Continue Lasix  IV twice daily.  Strict INO's and daily weight.  Monitor kidney function closely.  Continue losartan and Coreg. -She is -3.5 L since admission -Keep potassium above 4, magnesium above 2.  Replenish electrolytes  Uncontrolled hypertension: -Blood pressure improved.  Will continue current dose of Coreg and losartan.  Monitor blood pressure closely  Uncontrolled insulin-dependent DM  - A1c was 10.2% in October 2023  - Continue insulin pump   -Monitor blood sugar closely.   CKD IV  - SCr is 2.63 on admission, kidney function worsening.   -Consulted nephrology for further management.  Major depression  - Continue Wellbutrin and Paxil   Diabetic neuropathy: Continue gabapentin.  Chronic hypoxic respiratory failure History of COPD: -Patient uses oxygen at bedtime 2 L via nasal cannula. -No wheezing noted on exam.  Continue DuoNebs as needed  Hypercholesterolemia: Continue atorvastatin  History of CVA: Continue statin and Plavix.  RLE chronic wound: -consulted wound care -has wound care nurse visit at home   Hx of chronic Right lower extremity DVT: on eliquis  Right knee pain and swelling: -X-ray of knee from 12/23/2021 shows hematoma.  She denies any worsening of pain at this time. -Continue tramadol as needed.  Hypomagnesemia: Replenished.    DVT prophylaxis: Eliquis Code Status: Full code  Family Communication:  None present at bedside.  Plan of care discussed with patient in length and she verbalized understanding and agreed with it. Disposition Plan: To be determined  Consultants:  None  Procedures:  None  Antimicrobials:  None  Status is: Inpatient   Subjective: Patient  seen and examined.  Sitting comfortably on the bed eating breakfast.  Family at the bedside.  Patient reports improvement in symptoms, denies chest pain however continues to have leg edema.  She wishes to go home.  Family is concerned about her low hemoglobin and worsening kidney function.  Patient was scheduled for EGD and colonoscopy this month however now it is rescheduled in January.  She is followed by nephrology Dr. Candiss Norse outpatient.  She also sees cardiology as well.  She is on torsemide at home.  Objective: Vitals:   01/02/22 2009 01/03/22 0500 01/03/22 0508 01/03/22 0726  BP: (!) 141/64  113/73 (!) 140/72  Pulse: 71  68 66  Resp: '19  18 16  '$ Temp: 98.2 F (36.8 C)  97.7 F (36.5 C) 98 F (36.7 C)  TempSrc:      SpO2: 95%  94% 94%  Weight:  109.3 kg      Intake/Output Summary (Last 24 hours) at 01/03/2022 1021 Last data filed at 01/03/2022 0057 Gross per 24 hour  Intake 240 ml  Output 2500 ml  Net -2260 ml    Filed Weights   01/03/22 0500  Weight: 109.3 kg    Examination:  General exam: Appears calm and comfortable.  On nasal cannula.  Sitting comfortably and eating breakfast. Respiratory system: Clear to auscultation.  Decree breath sounds noted on the bases.   Cardiovascular system: S1 & S2 heard, RRR. No JVD, murmurs, rubs, gallops or clicks.  Bilateral pitting edema positive.  Right leg dressing dry and intact.   Gastrointestinal system: Abdomen is nondistended, soft and nontender. No organomegaly or masses felt. Normal bowel sounds heard. Central nervous system: Alert and oriented. No focal neurological deficits. Extremities: Right knee: Swollen and bruises noted on anterior aspect.  Some tenderness noted.  Right lower extremity: Dressing dry and intact-tender on palpation. Skin: No rashes, lesions or ulcers Psychiatry: Judgement and insight appear normal. Mood & affect appropriate.    Data Reviewed: I have personally reviewed following labs and imaging  studies  CBC: Recent Labs  Lab 01/01/22 1509 01/02/22 0459 01/03/22 0435  WBC 12.9* 15.7* 13.2*  HGB 6.7* 8.5* 8.2*  HCT 24.1* 29.6* 29.0*  MCV 82.3 80.4 81.0  PLT 389 435* 976    Basic Metabolic Panel: Recent Labs  Lab 01/01/22 1509 01/02/22 0500 01/03/22 0435  NA 144 142 142  K 4.5 4.6 4.5  CL 114* 111 109  CO2 '23 23 25  '$ GLUCOSE 79 157* 150*  BUN 29* 29* 33*  CREATININE 2.63* 2.62* 2.84*  CALCIUM 8.1* 8.0* 8.0*  MG  --  1.5* 1.8    GFR: Estimated Creatinine Clearance: 24.1 mL/min (A) (by C-G formula based on SCr of 2.84 mg/dL (H)). Liver Function Tests: No results for input(s): "AST", "ALT", "ALKPHOS", "BILITOT", "PROT", "ALBUMIN" in the last 168 hours. No results for input(s): "LIPASE", "AMYLASE" in the last 168 hours. No results for input(s): "AMMONIA" in the last 168 hours. Coagulation Profile: No results for input(s): "INR", "PROTIME" in the last 168 hours. Cardiac Enzymes: No results for input(s): "CKTOTAL", "CKMB", "CKMBINDEX", "TROPONINI" in the last 168 hours. BNP (last 3 results) No results for input(s): "PROBNP" in the last 8760 hours. HbA1C:  No results for input(s): "HGBA1C" in the last 72 hours. CBG: Recent Labs  Lab 01/02/22 0726 01/02/22 1147 01/02/22 2010 01/03/22 0259 01/03/22 0730  GLUCAP 139* 241* 121* 148* 141*    Lipid Profile: No results for input(s): "CHOL", "HDL", "LDLCALC", "TRIG", "CHOLHDL", "LDLDIRECT" in the last 72 hours. Thyroid Function Tests: No results for input(s): "TSH", "T4TOTAL", "FREET4", "T3FREE", "THYROIDAB" in the last 72 hours. Anemia Panel: No results for input(s): "VITAMINB12", "FOLATE", "FERRITIN", "TIBC", "IRON", "RETICCTPCT" in the last 72 hours. Sepsis Labs: No results for input(s): "PROCALCITON", "LATICACIDVEN" in the last 168 hours.  No results found for this or any previous visit (from the past 240 hour(s)).    Radiology Studies: DG Chest 2 View  Result Date: 01/01/2022 CLINICAL DATA:   Shortness of breath, chest pain. EXAM: CHEST - 2 VIEW COMPARISON:  August 14, 2021. FINDINGS: Stable cardiomegaly. Mild central pulmonary vascular congestion is noted. Possible bibasilar pulmonary edema or atelectasis is noted. Bony thorax is unremarkable. IMPRESSION: Stable cardiomegaly with mild central pulmonary vascular congestion and possible bibasilar pulmonary edema or atelectasis. Electronically Signed   By: Marijo Conception M.D.   On: 01/01/2022 15:28    Scheduled Meds:  apixaban  5 mg Oral BID   atorvastatin  40 mg Oral Daily   buPROPion  150 mg Oral BID   carvedilol  25 mg Oral BID WC   clopidogrel  75 mg Oral Daily   furosemide  40 mg Intravenous BID   gabapentin  300 mg Oral BID   insulin pump   Subcutaneous TID WC, HS, 0200   losartan  50 mg Oral Daily   pantoprazole  40 mg Oral BID   PARoxetine  40 mg Oral Daily   silver sulfADIAZINE  1 Application Topical Daily   Continuous Infusions:  magnesium sulfate bolus IVPB 2 g (01/03/22 0930)     LOS: 1 day   Time spent: 38 minutes   Brayah Urquilla Loann Quill, MD [facility]  If 7PM-7AM, please contact night-coverage [email or web address] 01/03/2022, 10:21 AM

## 2022-01-03 NOTE — Evaluation (Signed)
Physical Therapy Evaluation Patient Details Name: Jocelyn Wells MRN: [id number] DOB: [birth year] Today's Date: 01/03/2022  History of Present Illness  Patient is a 66 y.o. female with medical history of diabetes mellitus, nocturnal supplemental oxygen use, chronic CHF, CKD stage IV, COPD, depression, RLE DVT, anemia. She presents to the emergency department with fatigue and dyspnea. Found to have symptomatic anemia with hemoglobin 6.7 on arrival, acute on chronic diastolic heart failure. R knee pain and swelling with X-ray from 11/15 showing hematoma   Clinical Impression  Patient is agreeable to PT evaluation. Family member at the bedside. The patient reports she wears 2 L02 at night and is ambulatory with rolling walker in the home and rollator outside the home. She has had home health PT in the past and is in agreement to have home health again if recommended.  Today, the patient required supervision- Min guard for mobility. Increased time and effort required with all activity with intermittent rest breaks needed. Dyspnea with exertion with cues for breathing techniques provided. Supplemental oxygen removed during mobility with Sp02 91% at rest and 86% after walking on room air. Increase back to the mid 90's with replacement of the 2 L02. Recommend PT follow up to maximize independence and facilitate return to prior level of function with HHPT recommended at discharge.       Recommendations for follow up therapy are one component of a multi-disciplinary discharge planning process, led by the attending physician.  Recommendations may be updated based on patient status, additional functional criteria and insurance authorization.  Follow Up Recommendations Home health PT      Assistance Recommended at Discharge Set up Supervision/Assistance  Patient can return home with the following  Assist for transportation;Help with stairs or ramp for entrance;Assistance with cooking/housework     Equipment Recommendations None recommended by PT  Recommendations for Other Services       Functional Status Assessment Patient has had a recent decline in their functional status and demonstrates the ability to make significant improvements in function in a reasonable and predictable amount of time.     Precautions / Restrictions Precautions Precautions: Fall Restrictions Weight Bearing Restrictions: No      Mobility  Bed Mobility Overal bed mobility: Needs Assistance Bed Mobility: Supine to Sit, Sit to Supine     Supine to sit: Supervision, HOB elevated Sit to supine: Supervision, HOB elevated   General bed mobility comments: increased time and effort required with fatigue with minimal activity. rest breaks required between activity bouts    Transfers Overall transfer level: Needs assistance Equipment used: Rolling walker (2 wheels) Transfers: Sit to/from Stand Sit to Stand: Min guard           General transfer comment: patient able to stand with Min guard assistance on the third try. verbal cues for safety. no dizziness reported with upright activity    Ambulation/Gait Ambulation/Gait assistance: Min guard Gait Distance (Feet): 50 Feet Assistive device: Rolling walker (2 wheels) Gait Pattern/deviations: Decreased stride length, Decreased stance time - right, Antalgic Gait velocity: decreased     General Gait Details: Patient ambulated into hallway with pain reported in right leg with ambulation. No dizziness reported, however patient is fatigued with activity. Sp02 86% on room air after walking with increase to 93% on 2 L02 with rest break and cues for breathing techniques.  Stairs            Wheelchair Mobility    Modified Rankin (Stroke Patients Only)  Balance Overall balance assessment: Needs assistance Sitting-balance support: Feet supported Sitting balance-Leahy Scale: Good     Standing balance support: Bilateral upper extremity  supported, Reliant on assistive device for balance Standing balance-Leahy Scale: Fair Standing balance comment: heavy reliance on rolling walker for UE support in standing                             Pertinent Vitals/Pain Pain Assessment Pain Assessment: Faces Faces Pain Scale: Hurts little more Pain Location: distal RLE with movement Pain Descriptors / Indicators: Sore Pain Intervention(s): Limited activity within patient's tolerance    Home Living Family/patient expects to be discharged to:: Private residence Living Arrangements: Spouse/significant other Available Help at Discharge: Family Type of Home: House Home Access: Stairs to enter Entrance Stairs-Rails: Can reach both;Right;Left Entrance Stairs-Number of Steps: 4   Home Layout: One level Home Equipment: Rollator (4 wheels);Cane - single point;Shower seat;Electric Landscape architect (2 wheels) Additional Comments: 2 L02 at night    Prior Function Prior Level of Function : History of Falls (last six months);Independent/Modified Independent             Mobility Comments: patient reports using her rollator for outside the home and the rolling walker inside the home. falls reported ADLs Comments: Reports Mod I in ADLs     Hand Dominance        Extremity/Trunk Assessment   Upper Extremity Assessment Upper Extremity Assessment: Overall WFL for tasks assessed    Lower Extremity Assessment Lower Extremity Assessment: Generalized weakness (with pain distally in RLE reported with weight bearing/mobility)       Communication      Cognition Arousal/Alertness: Awake/alert Behavior During Therapy: WFL for tasks assessed/performed Overall Cognitive Status: Within Functional Limits for tasks assessed                                 General Comments: patient reports she feels some anxiety but has anxiety at baseline.        General Comments General comments (skin integrity, edema,  etc.): Patient is fatigued with minimal activity and dyspna with exertion. Cues provided for slow, deep breathing with pursed lip technique encouraged. Supplemental 02 removed for mobility. Sp02 91% on room air before ambulating and 86 % on room air after ambulating. Replaced the 2 L02 at end of session with Sp02 93%.    Exercises     Assessment/Plan    PT Assessment Patient needs continued PT services  PT Problem List Decreased strength;Decreased range of motion;Decreased activity tolerance;Decreased balance;Decreased mobility;Cardiopulmonary status limiting activity;Pain       PT Treatment Interventions DME instruction;Gait training;Stair training;Functional mobility training;Therapeutic activities;Therapeutic exercise;Balance training;Neuromuscular re-education;Patient/family education    PT Goals (Current goals can be found in the Care Plan section)  Acute Rehab PT Goals Patient Stated Goal: to go home PT Goal Formulation: With patient Time For Goal Achievement: 01/17/22 Potential to Achieve Goals: Good    Frequency Min 2X/week     Co-evaluation               AM-PAC PT "6 Clicks" Mobility  Outcome Measure Help needed turning from your back to your side while in a flat bed without using bedrails?: A Little Help needed moving from lying on your back to sitting on the side of a flat bed without using bedrails?: A Little Help needed moving to and from a bed  to a chair (including a wheelchair)?: A Little Help needed standing up from a chair using your arms (e.g., wheelchair or bedside chair)?: A Little Help needed to walk in hospital room?: A Little Help needed climbing 3-5 steps with a railing? : A Lot 6 Click Score: 17    End of Session Equipment Utilized During Treatment: Gait belt Activity Tolerance: Patient limited by fatigue Patient left: in bed;with call bell/phone within reach;with family/visitor present Nurse Communication: Mobility status PT Visit Diagnosis:  Unsteadiness on feet (R26.81);Muscle weakness (generalized) (M62.81)    Time: 4383-8184 PT Time Calculation (min) (ACUTE ONLY): 24 min   Charges:   PT Evaluation $PT Eval Low Complexity: 1 Low PT Treatments $Therapeutic Activity: 8-22 mins        Minna Merritts, PT, MPT   Percell Locus 01/03/2022, 2:25 PM

## 2022-01-03 NOTE — Progress Notes (Signed)
Jocelyn Wells  MRN: [id number]  DOB/AGE: Aug 04, 1955 66 y.o.  Primary Care Physician:Jocelyn Wells Standard, FNP  Admit date: 01/01/2022  Chief Complaint:  Chief Complaint  Patient presents with   Shortness of Breath    S-Pt presented on  01/01/2022 with  Chief Complaint  Patient presents with   Shortness of Breath  . Patient is a 66 year old Caucasian female with a past medical history of diabetes mellitus type 2, nocturnal oxygen use, chronic diastolic CHF, chronic kidney disease stage IV, COPD, depression, anemia who came to the ER with chief complaint of tiredness and shortness of breath.  History of present illness date backs to past few days when patient was getting short of breath.  It was progressive in nature. Patient also complained of feeling tired. Patient home health aide was concerned about her symptoms as well.  Patient also complained of increasing lower extremity edema. Upon evaluation in the ER patient was found to have hemoglobin of 6.7 and patient was admitted for further care. Patient is well-known to our practice. Nephrology was consulted for comanagement of chronic kidney disease. Patient as an outpatient follows up with Dr. Lanora Wells.  Patient last visit was on December 17, 2021.  Patient was seen today on first floor.Patient's son was present in the room. Patient or the son offers no new specific complaint. Patient's son informed me that patient was feeling better than before. Patient herself was resting comfortably   Medications    apixaban  5 mg Oral BID   atorvastatin  40 mg Oral Daily   buPROPion  150 mg Oral BID   carvedilol  25 mg Oral BID WC   clopidogrel  75 mg Oral Daily   furosemide  40 mg Intravenous BID   gabapentin  300 mg Oral BID   insulin pump   Subcutaneous TID WC, HS, 0200   losartan  50 mg Oral Daily   pantoprazole  40 mg Oral BID   PARoxetine  40 mg Oral Daily   silver sulfADIAZINE  1 Application Topical Daily          DJM:EQAST from the symptoms mentioned above,there are no other symptoms referable to all systems reviewed.  Physical Exam: Vital signs in last 24 hours: Temp:  [97.7 F (36.5 C)-98.4 F (36.9 C)] 98 F (36.7 C) (11/26 0726) Pulse Rate:  [66-71] 66 (11/26 0726) Resp:  [16-19] 16 (11/26 0726) BP: (113-141)/(64-73) 140/72 (11/26 0726) SpO2:  [94 %-95 %] 94 % (11/26 0726) Weight:  [109.3 kg] 109.3 kg (11/26 0500) Weight change:     Intake/Output from previous day: 11/25 0701 - 11/26 0700 In: 240 [P.O.:240] Out: 3500 [Urine:3500] No intake/output data recorded.   Physical Exam:  General- pt is awake,alert, oriented to time place and person  HEENT-Head is atraumatic normocephalic  Resp- No acute REsp distress, Decreased breath sounds at bases  CVS- S1S2 regular in rate and rhythm  GIT- BS+, soft, Non tender , Non distended  EXT- Bilateral extremities in bandages ,  No Cyanosis    Lab Results:  CBC  Recent Labs    01/02/22 0459 01/03/22 0435  WBC 15.7* 13.2*  HGB 8.5* 8.2*  HCT 29.6* 29.0*  PLT 435* 383    BMET  Recent Labs    01/02/22 0500 01/03/22 0435  NA 142 142  K 4.6 4.5  CL 111 109  CO2 23 25  GLUCOSE 157* 150*  BUN 29* 33*  CREATININE 2.62* 2.84*  CALCIUM 8.0* 8.0*  Most recent Creatinine trend  Lab Results  Component Value Date   CREATININE 2.84 (H) 01/03/2022   CREATININE 2.62 (H) 01/02/2022   CREATININE 2.63 (H) 01/01/2022      MICRO   No results found for this or any previous visit (from the past 240 hour(s)).       Impression:   1)Renal CKD stage IV secondary to diabetes mellitus with proteinuria  CKD- chronic kidney disease. Patient has CKD stage IV Patient has CKD secondary to diabetes mellitus. Patient has CKD going back to 2016. Patient peak creatinine was at around 4.1 . Patient creatinine is currently admitted to her baseline Patient CKD has been marked with multiple episodes of AKI.   Patient had a severe AKI in July of this year when patient required renal placement therapy as an inpatient.  Proteinuria  Patient does have a history of nephrotic range proteinuria secondary to diabetic disease. Will continue patient on ARB-losartan   2)HTN    Blood pressure is stable    3)Anemia of chronic disease And iron deficiency anemia     Latest Ref Rng & Units 01/03/2022    4:35 AM 01/02/2022    4:59 AM 01/01/2022    3:09 PM  CBC  WBC 4.0 - 10.5 K/uL 13.2  15.7  12.9   Hemoglobin 12.0 - 15.0 g/dL 8.2  8.5  6.7   Hematocrit 36.0 - 46.0 % 29.0  29.6  24.1   Platelets 150 - 400 K/uL 383  435  389        HGb Is not at goal (9--11) Patient did receive PRBC yesterday Patient is scheduled for outpatient GI and hematology workup   4) Secondary hyperparathyroidism -CKD Mineral-Bone Disorder    Lab Results  Component Value Date   CALCIUM 8.0 (L) 01/03/2022   CAION 1.08 (L) 01/29/2015   PHOS 3.8 08/20/2021   Secondary hyperparathyroidism Patient does have a history of secondary hyperparathyroidism with patient intact PTH being elevated to 335 as an outpatient Will follow  Phosphorus at goal.   5)Acute on chronic diastolic CHF Patient is currently nearly 4 L negative.  6) Electrolytes      Latest Ref Rng & Units 01/03/2022    4:35 AM 01/02/2022    5:00 AM 01/01/2022    3:09 PM  BMP  Glucose 70 - 99 mg/dL 150  157  79   BUN 8 - 23 mg/dL 33  29  29   Creatinine 0.44 - 1.00 mg/dL 2.84  2.62  2.63   Sodium 135 - 145 mmol/L 142  142  144   Potassium 3.5 - 5.1 mmol/L 4.5  4.6  4.5   Chloride 98 - 111 mmol/L 109  111  114   CO2 22 - 32 mmol/L '25  23  23   '$ Calcium 8.9 - 10.3 mg/dL 8.0  8.0  8.1      Sodium Normonatremic   Potassium Normokalemic    7)Acid base    Co2 at goal     Plan:   Will continue patient on current Diuresis Will continue patient on ARB for now as patient GFR/creatinine is near to baseline.     Jocelyn Wells s  Jocelyn Wells 01/03/2022, 9:14 AM

## 2022-01-04 ENCOUNTER — Inpatient Hospital Stay: Payer: Medicare Other

## 2022-01-04 DIAGNOSIS — D649 Anemia, unspecified: Secondary | ICD-10-CM | POA: Diagnosis not present

## 2022-01-04 LAB — GLUCOSE, CAPILLARY
Glucose-Capillary: 194 mg/dL — ABNORMAL HIGH (ref 70–99)
Glucose-Capillary: 125 mg/dL — ABNORMAL HIGH (ref 70–99)
Glucose-Capillary: 112 mg/dL — ABNORMAL HIGH (ref 70–99)

## 2022-01-04 LAB — BASIC METABOLIC PANEL
Anion gap: 8 (ref 5–15)
BUN: 41 mg/dL — ABNORMAL HIGH (ref 8–23)
CO2: 27 mmol/L (ref 22–32)
Calcium: 8.1 mg/dL — ABNORMAL LOW (ref 8.9–10.3)
Chloride: 106 mmol/L (ref 98–111)
Creatinine, Ser: 2.96 mg/dL — ABNORMAL HIGH (ref 0.44–1.00)
GFR, Estimated: 17 mL/min — ABNORMAL LOW (ref >60)
Glucose, Bld: 208 mg/dL — ABNORMAL HIGH (ref 70–99)
Potassium: 5.1 mmol/L (ref 3.5–5.1)
Sodium: 141 mmol/L (ref 135–145)

## 2022-01-04 LAB — CBC
HCT: 29.7 % — ABNORMAL LOW (ref 36.0–46.0)
Hemoglobin: 8.6 g/dL — ABNORMAL LOW (ref 12.0–15.0)
MCH: 23.2 pg — ABNORMAL LOW (ref 26.0–34.0)
MCHC: 29 g/dL — ABNORMAL LOW (ref 30.0–36.0)
MCV: 80.1 fL (ref 80.0–100.0)
Platelets: 398 10*3/uL (ref 150–400)
RBC: 3.71 MIL/uL — ABNORMAL LOW (ref 3.87–5.11)
RDW: 20.1 % — ABNORMAL HIGH (ref 11.5–15.5)
WBC: 13.3 10*3/uL — ABNORMAL HIGH (ref 4.0–10.5)
nRBC: 0 % (ref 0.0–0.2)

## 2022-01-04 LAB — MAGNESIUM: Magnesium: 2 mg/dL (ref 1.7–2.4)

## 2022-01-04 MED ORDER — CARVEDILOL 25 MG PO TABS: 25.0000 mg | ORAL_TABLET | Freq: Two times a day (BID) | ORAL | 0 refills | Status: DC

## 2022-01-04 NOTE — TOC Transition Note (Signed)
Transition of Care [facility]) - CM/SW Discharge Note   Patient Details  Name: Jocelyn Wells MRN: [id number] Date of Birth: 1955/07/23  Transition of Care [facility] CM/SW Contact:  Colen Darling, [place] Phone Number: 01/04/2022, 1:36 PM   Clinical Narrative:     TOC met with the patient at bedside. Family will provide a ride home. This patient has home oxygen at home.  Final next level of care: [place] Barriers to Discharge: Barriers Resolved   Patient Goals and CMS Choice Patient states their goals for this hospitalization and ongoing recovery are:: start home health CMS Medicare.gov Compare Post Acute Care list provided to:: Patient    Discharge Placement                Patient to be transferred to facility by: Family Name of family member notified: Aquilla Hacker Patient and family notified of of transfer: 01/04/22  Discharge Plan and Services                DME Arranged: Oxygen         HH Arranged: RN, PT, OT Lupus Agency:  ([place]) Date [place]: 01/04/22 Time [place]: [place] Representative spoke with at [place]: [place] ([place]) Interventions     Readmission Risk Interventions     No data to display

## 2022-01-04 NOTE — Discharge Summary (Signed)
Physician Discharge Summary  Jocelyn Wells:[id number] DOB: 14-Jul-1955 DOA: 01/01/2022  PCP: Gwyneth Sprout, FNP  Admit date: 01/01/2022 Discharge date: 01/04/2022  Admitted From: Home Disposition:  Home  Discharge Condition:Stable CODE STATUS:FULL Diet recommendation: Heart Healthy  Brief/Interim Summary: Patient is a 66 year old female with history of insulin-dependent diabetes mellitus, nocturnal supplemental use, chronic diastolic CHF, CKD stage IV, COPD, depression, chronic anemia who presented to the ED with fatigue and dyspnea, also increasing bilateral lower extremity swelling.  On presentation she was requiring 2 L of oxygen to maintain oxygen saturation.  Chest x-ray showed cardiomegaly, mild vascular congestion, bibasilar edema.  Hemoglobin was 6.7 so patient was ordered blood transfusion.  Hospital course remarkable for fluid overload needing IV diuresis with Lasix.  Nephrology was following for AKI.  Patient is currently near euvolemic status, nephrology cleared for discharge from nephrology perspective.  Patient is very eager to go home, plan for discharge today, suggested follow-up with PCP in a week for checking kidney function.  Following problems were addressed during her hospitalization:  Symptomatic chronic anemia: Hemoglobin was in the range of 6 on presentation.  She follows with hematology, her anemia is attributed to her CKD and also iron deficiency.  She has been scheduled for outpatient endoscopy in January.  She was given a unit of blood transfusion on presentation.  Currently hemoglobin in the range of 8.  Continue monitoring.  She will follow-up with hematology as an outpatient.   Acute on chronic diastolic CHF: Presented with dyspnea, increased bilateral lower extremity swelling, chest x-ray with features of fluid overload.  Last echo showed EF of 45 to 50% from August.She was  on Lasix 40 mg twice daily.  Also on losartan and Coreg. She takes torsemide at  home.   CKD stage IV: Nephrology following.  Her CKD is attributed to diabetes mellitus.  Her baseline creatinine is around 2.5.  Follow-up BMP in a week   Uncontrolled insulin-dependent diabetes mellitus: Last A1c of 10.2 as per October 2,2023.  On insulin pump.   Hypertension: Continue Coreg, losartan, monitor blood pressure at home   Depression: On Wellbutrin, Paxil   Diabetic neuropathy: Continue gabapentin   Acute on chronic hypoxic respiratory failure/COPD: On 2 L of oxygen at bedtime via nasal cannula.  Currently requiring 2 L of oxygen at day ime  Hyperlipidemia: Continue Lipitor   History of CVA: Continue statin, Plavix   Leukocytosis: On reviewing her previous labs, she has chronic mild leukocytosis.  She follows with hematology as an outpatient   Right lower extremity wound: Wound care consulted.  She follows with wound care nurse at home   History of right lower extremity DVT: On Eliquis   Right knee pain and swelling:continue supportive care.  X-ray on the right knee on 11/15 showed possible hematoma.denied any pain today   Deconditioning/weakness: PT /OT recommended home health on discharge   Discharge Diagnoses:  Principal Problem:   Symptomatic anemia Active Problems:   Type 2 diabetes mellitus with other diabetic kidney complication (HCC)   Acute on chronic diastolic CHF (congestive heart failure) (HCC)   Hypertension associated with diabetes (HCC)   COPD (chronic obstructive pulmonary disease) (HCC)   CKD (chronic kidney disease), stage IV (HCC)   MDD (major depressive disorder), recurrent episode, mild ([place])    Discharge Instructions  Discharge Instructions     Diet - low sodium heart healthy   Complete by: As directed    Discharge instructions   Complete by: As directed  1)Please take your medications as instructed 2)Follow up with your PCP in a week.  Do a BMP test to check your kidney function during the follow-up 3)Monitor your weight at  home 4) follow-up with your nephrologist as an outpatient.   Discharge wound care:   Complete by: As directed    As per wound care nurse   Increase activity slowly   Complete by: As directed       Allergies as of 01/04/2022       Reactions   Codeine Nausea And Vomiting   Ivp Dye [iodinated Contrast Media]    Patients states the IV Dye shuts her kidneys down        Medication List     TAKE these medications    albuterol 108 (90 Base) MCG/ACT inhaler Commonly known as: VENTOLIN HFA TAKE 2 PUFFS BY MOUTH EVERY 6 HOURS AS NEEDED FOR WHEEZE OR SHORTNESS OF BREATH What changed: See the new instructions.   apixaban 5 MG Tabs tablet Commonly known as: Eliquis Take 1 tablet (5 mg total) by mouth 2 (two) times daily.   atorvastatin 40 MG tablet Commonly known as: LIPITOR Take 1 tablet (40 mg total) by mouth daily.   buPROPion 150 MG 12 hr tablet Commonly known as: WELLBUTRIN SR Take 1 tablet (150 mg total) by mouth 2 (two) times daily.   carvedilol 25 MG tablet Commonly known as: COREG Take 1 tablet (25 mg total) by mouth 2 (two) times daily with a meal. What changed:  medication strength how much to take   clopidogrel 75 MG tablet Commonly known as: PLAVIX Take 1 tablet (75 mg total) by mouth daily.   Dulaglutide 3 MG/0.5ML Sopn Inject into the skin once a week.   fluticasone 50 MCG/ACT nasal spray Commonly known as: FLONASE Place 2 sprays into both nostrils daily.   gabapentin 300 MG capsule Commonly known as: NEURONTIN Take 300 mg by mouth 2 (two) times daily.   glucose blood test strip OneTouch Verio strips   insulin aspart 100 UNIT/ML injection Commonly known as: novoLOG Insulin pump   insulin pump Soln Per endocrinoloy   loratadine 10 MG tablet Commonly known as: CLARITIN Take 1 tablet by mouth daily.   losartan 50 MG tablet Commonly known as: COZAAR Take 1 tablet (50 mg total) by mouth daily.   meclizine 12.5 MG tablet Commonly known as:  ANTIVERT Take 1 tablet (12.5 mg total) by mouth 3 (three) times daily as needed for dizziness.   Omnipod DASH Pods (Gen 4) Misc USE 1 POD EACH EVERY 72    HOURS   optichamber diamond Misc Use with inhaler to ensure medication is delivered throughout lungs   pantoprazole 40 MG tablet Commonly known as: PROTONIX Take 1 tablet (40 mg total) by mouth 2 (two) times daily.   PARoxetine 40 MG tablet Commonly known as: PAXIL Take 1 tablet (40 mg total) by mouth daily.   silver sulfADIAZINE 1 % cream Commonly known as: SILVADENE Apply 1 Application topically daily.   torsemide 20 MG tablet Commonly known as: DEMADEX Take 20 mg by mouth daily.   traMADol 50 MG tablet Commonly known as: ULTRAM Take 1 tablet (50 mg total) by mouth every 12 (twelve) hours as needed.               Durable Medical Equipment  (From admission, onward)           Start     Ordered   01/04/22 1256  For home  use only DME oxygen  Once       Question Answer Comment  Length of Need Lifetime   Mode or (Route) Nasal cannula   Liters per Minute 2   Frequency Continuous (stationary and portable oxygen unit needed)   Oxygen delivery system Gas      01/04/22 1255              Discharge Care Instructions  (From admission, onward)           Start     Ordered   01/04/22 0000  Discharge wound care:       Comments: As per wound care nurse   01/04/22 1258            Follow-up Information     Gwyneth Sprout, FNP. Schedule an appointment as soon as possible for a visit in 1 week(s).   Specialty: Family Medicine Contact information: [place] [address] [phone]                Allergies  Allergen Reactions   Codeine Nausea And Vomiting   Ivp Dye [Iodinated Contrast Media]     Patients states the IV Dye shuts her kidneys down    Consultations: Nephrology   Procedures/Studies: DG Chest 2 View  Result Date: 01/01/2022 CLINICAL DATA:   Shortness of breath, chest pain. EXAM: CHEST - 2 VIEW COMPARISON:  August 14, 2021. FINDINGS: Stable cardiomegaly. Mild central pulmonary vascular congestion is noted. Possible bibasilar pulmonary edema or atelectasis is noted. Bony thorax is unremarkable. IMPRESSION: Stable cardiomegaly with mild central pulmonary vascular congestion and possible bibasilar pulmonary edema or atelectasis. Electronically Signed   By: Marijo Conception M.D.   On: 01/01/2022 15:28   DG Foot Complete Right  Result Date: 12/23/2021 CLINICAL DATA:  Foot pain status post fall. EXAM: RIGHT FOOT COMPLETE - 3+ VIEW COMPARISON:  Right ankle and foot radiographs 07/14/2021 FINDINGS: Moderate to severe lateral great toe metatarsophalangeal joint space narrowing with mild-to-moderate peripheral lateral degenerative spurring. Tiny plantar calcaneal heel spur. Mild-to-moderate dorsal forefoot soft tissue swelling. No acute fracture is seen.  No dislocation. IMPRESSION: 1. No acute fracture. 2. Moderate to severe great toe metatarsophalangeal joint osteoarthritis. Electronically Signed   By: Yvonne Kendall M.D.   On: 12/23/2021 15:19   DG Tibia/Fibula Right  Result Date: 12/23/2021 CLINICAL DATA:  Lower leg pain status post fall. EXAM: RIGHT TIBIA AND FIBULA - 2 VIEW COMPARISON:  Right ankle radiographs 07/14/2021, right knee radiographs 11/23/2012 FINDINGS: Moderate medial compartment of the knee joint space narrowing. The ankle mortise is symmetric and intact. Mild superior patellar degenerative osteophytosis. Small knee joint effusion. No acute fracture is seen. No dislocation. IMPRESSION: 1. No acute fracture. 2. Moderate medial compartment of the knee osteoarthritis. Electronically Signed   By: Yvonne Kendall M.D.   On: 12/23/2021 15:17   DG Knee Complete 4 Views Right  Result Date: 12/23/2021 CLINICAL DATA:  Right knee pain status post fall. Fell twice 4 days prior. Medial anterior knee pain, bruising, and swelling. EXAM: RIGHT KNEE -  COMPLETE 4+ VIEW COMPARISON:  Right knee radiographs 11/23/2012 FINDINGS: Moderate medial compartment joint space narrowing and mild peripheral osteophytosis. Moderate patellofemoral joint space narrowing and mild peripheral osteophytosis. Small joint effusion, new from prior. New swelling and increased density within the soft tissues anterior to the patella and proximal tibia, likely a posttraumatic soft tissue hematoma measuring up to approximately 13.5 cm in craniocaudal dimension and 2.5 cm in AP dimension.  6 mm age-indeterminate ossicle just anterior to the patella. No acute fracture is seen. No dislocation. IMPRESSION: 1. New swelling and increased density within the soft tissues anterior to the patella and proximal tibia, likely a posttraumatic soft tissue hematoma. 2. Moderate medial and patellofemoral compartment osteoarthritis. 3. No acute fracture is seen. Electronically Signed   By: Yvonne Kendall M.D.   On: 12/23/2021 15:15      Subjective: Patient seen and examined at bedside today.  Hemodynamically stable.  Significant improvement of lower extremity edema.  On 2 L of oxygen throughout.  Husband at bedside.  Patient and her husband are very eager to go home today.  Discharge Exam: Vitals:   01/04/22 0413 01/04/22 0700  BP: (!) 138/59 123/61  Pulse: 72 73  Resp: 20 20  Temp: 98.1 F (36.7 C) 98 F (36.7 C)  SpO2: 96% 95%   Vitals:   01/03/22 2134 01/04/22 0343 01/04/22 0413 01/04/22 0700  BP: (!) 148/65  (!) 138/59 123/61  Pulse: 73  72 73  Resp: '18  20 20  '$ Temp: 98.1 F (36.7 C)  98.1 F (36.7 C) 98 F (36.7 C)  TempSrc:   Oral Oral  SpO2: 92%  96% 95%  Weight:  111.6 kg      General: Pt is alert, awake, not in acute distress,obese Cardiovascular: RRR, S1/S2 +, no rubs, no gallops Respiratory: faint crackles on bilateral bases Abdominal: Soft, NT, ND, bowel sounds + Extremities: trace lower extremity edema, no cyanosis    The results of significant diagnostics  from this hospitalization (including imaging, microbiology, ancillary and laboratory) are listed below for reference.     Microbiology: No results found for this or any previous visit (from the past 240 hour(s)).   Labs: BNP (last 3 results) Recent Labs    01/01/22 1730  BNP 5,027.7*   Basic Metabolic Panel: Recent Labs  Lab 01/01/22 1509 01/02/22 0500 01/03/22 0435 01/04/22 0434  NA 144 142 142 141  K 4.5 4.6 4.5 5.1  CL 114* 111 109 106  CO2 '23 23 25 27  '$ GLUCOSE 79 157* 150* 208*  BUN 29* 29* 33* 41*  CREATININE 2.63* 2.62* 2.84* 2.96*  CALCIUM 8.1* 8.0* 8.0* 8.1*  MG  --  1.5* 1.8 2.0   Liver Function Tests: No results for input(s): "AST", "ALT", "ALKPHOS", "BILITOT", "PROT", "ALBUMIN" in the last 168 hours. No results for input(s): "LIPASE", "AMYLASE" in the last 168 hours. No results for input(s): "AMMONIA" in the last 168 hours. CBC: Recent Labs  Lab 01/01/22 1509 01/02/22 0459 01/03/22 0435 01/04/22 0434  WBC 12.9* 15.7* 13.2* 13.3*  HGB 6.7* 8.5* 8.2* 8.6*  HCT 24.1* 29.6* 29.0* 29.7*  MCV 82.3 80.4 81.0 80.1  PLT 389 435* 383 398   Cardiac Enzymes: No results for input(s): "CKTOTAL", "CKMB", "CKMBINDEX", "TROPONINI" in the last 168 hours. BNP: Invalid input(s): "POCBNP" CBG: Recent Labs  Lab 01/03/22 1545 01/03/22 2035 01/04/22 0407 01/04/22 0837 01/04/22 1211  GLUCAP 176* 227* 194* 125* 112*   D-Dimer No results for input(s): "DDIMER" in the last 72 hours. Hgb A1c No results for input(s): "HGBA1C" in the last 72 hours. Lipid Profile No results for input(s): "CHOL", "HDL", "LDLCALC", "TRIG", "CHOLHDL", "LDLDIRECT" in the last 72 hours. Thyroid function studies No results for input(s): "TSH", "T4TOTAL", "T3FREE", "THYROIDAB" in the last 72 hours.  Invalid input(s): "FREET3" Anemia work up No results for input(s): "VITAMINB12", "FOLATE", "FERRITIN", "TIBC", "IRON", "RETICCTPCT" in the last 72 hours. Urinalysis  Component Value  Date/Time   COLORURINE YELLOW (A) 01/01/2022 2105   APPEARANCEUR HAZY (A) 01/01/2022 2105   LABSPEC 1.017 01/01/2022 2105   PHURINE 5.0 01/01/2022 2105   GLUCOSEU 50 (A) 01/01/2022 2105   HGBUR SMALL (A) 01/01/2022 2105   BILIRUBINUR NEGATIVE 01/01/2022 2105   KETONESUR NEGATIVE 01/01/2022 2105   PROTEINUR >=300 (A) 01/01/2022 2105   NITRITE NEGATIVE 01/01/2022 2105   LEUKOCYTESUR NEGATIVE 01/01/2022 2105   Sepsis Labs Recent Labs  Lab 01/01/22 1509 01/02/22 0459 01/03/22 0435 01/04/22 0434  WBC 12.9* 15.7* 13.2* 13.3*   Microbiology No results found for this or any previous visit (from the past 240 hour(s)).  Please note: You were cared for by a hospitalist during your hospital stay. Once you are discharged, your primary care physician will handle any further medical issues. Please note that NO REFILLS for any discharge medications will be authorized once you are discharged, as it is imperative that you return to your primary care physician (or establish a relationship with a primary care physician if you do not have one) for your post hospital discharge needs so that they can reassess your need for medications and monitor your lab values.    Time coordinating discharge: 40 minutes  SIGNED:   Shelly Coss, MD  [facility] 01/04/2022, 12:58 PM Pager [phone]  If 7PM-7AM, please contact night-coverage [email or web address] Password [email or web address]

## 2022-01-04 NOTE — Progress Notes (Signed)
[facility]  ROUNDING NOTE   Subjective:   Patient seen sitting up in bed, preparing to eat breakfast Alert and oriented Remains on 2L, baseline States she is at baseline respiratory status  Objective:  Vital signs in last 24 hours:  Temp:  [98 F (36.7 C)-98.1 F (36.7 C)] 98 F (36.7 C) (11/27 0700) Pulse Rate:  [72-74] 73 (11/27 0700) Resp:  [18-20] 20 (11/27 0700) BP: (116-148)/(59-72) 123/61 (11/27 0700) SpO2:  [92 %-96 %] 95 % (11/27 0700) Weight:  [111.6 kg] 111.6 kg (11/27 0343)  Weight change: 2.3 kg Filed Weights   01/03/22 0500 01/04/22 0343  Weight: 109.3 kg 111.6 kg    Intake/Output: I/O last 3 completed shifts: In: 840 [P.O.:840] Out: 3250 [Urine:3250]   Intake/Output this shift:  No intake/output data recorded.  Physical Exam: General: NAD  Head: Normocephalic, atraumatic. Moist oral mucosal membranes  Eyes: Anicteric  Lungs:  Clear to auscultation, normal effort, Jocelyn Wells  Heart: Regular rate and rhythm  Abdomen:  Soft, nontender, obese  Extremities:  trace peripheral edema.  Neurologic: Nonfocal, moving all four extremities  Skin: No lesions  Access: None    Basic Metabolic Panel: Recent Labs  Lab 01/01/22 1509 01/02/22 0500 01/03/22 0435 01/04/22 0434  NA 144 142 142 141  K 4.5 4.6 4.5 5.1  CL 114* 111 109 106  CO2 '23 23 25 27  '$ GLUCOSE 79 157* 150* 208*  BUN 29* 29* 33* 41*  CREATININE 2.63* 2.62* 2.84* 2.96*  CALCIUM 8.1* 8.0* 8.0* 8.1*  MG  --  1.5* 1.8 2.0    Liver Function Tests: No results for input(s): "AST", "ALT", "ALKPHOS", "BILITOT", "PROT", "ALBUMIN" in the last 168 hours. No results for input(s): "LIPASE", "AMYLASE" in the last 168 hours. No results for input(s): "AMMONIA" in the last 168 hours.  CBC: Recent Labs  Lab 01/01/22 1509 01/02/22 0459 01/03/22 0435 01/04/22 0434  WBC 12.9* 15.7* 13.2* 13.3*  HGB 6.7* 8.5* 8.2* 8.6*  HCT 24.1* 29.6* 29.0* 29.7*  MCV 82.3 80.4 81.0 80.1  PLT 389 435*  383 398    Cardiac Enzymes: No results for input(s): "CKTOTAL", "CKMB", "CKMBINDEX", "TROPONINI" in the last 168 hours.  BNP: Invalid input(s): "POCBNP"  CBG: Recent Labs  Lab 01/03/22 1545 01/03/22 2035 01/04/22 0407 01/04/22 0837 01/04/22 1211  GLUCAP 176* 227* 194* 125* 112*    Microbiology: Results for orders placed or performed in visit on 07/29/21  Aerobic culture     Status: None   Collection Time: 07/29/21 10:45 AM  Result Value Ref Range Status   Aerobic Bacterial Culture Final report  Final   Organism ID, Bacteria Routine flora  Final    Comment: Scant growth    Coagulation Studies: No results for input(s): "LABPROT", "INR" in the last 72 hours.  Urinalysis: Recent Labs    01/01/22 2105  COLORURINE YELLOW*  LABSPEC 1.017  PHURINE 5.0  GLUCOSEU 50*  HGBUR SMALL*  BILIRUBINUR NEGATIVE  KETONESUR NEGATIVE  PROTEINUR >=300*  NITRITE NEGATIVE  LEUKOCYTESUR NEGATIVE      Imaging: No results found.   Medications:     apixaban  5 mg Oral BID   atorvastatin  40 mg Oral Daily   buPROPion  150 mg Oral BID   carvedilol  25 mg Oral BID WC   clopidogrel  75 mg Oral Daily   gabapentin  300 mg Oral BID   insulin pump   Subcutaneous TID WC, HS, 0200   losartan  50 mg Oral  Daily   pantoprazole  40 mg Oral BID   PARoxetine  40 mg Oral Daily   acetaminophen **OR** acetaminophen, albuterol, meclizine, senna-docusate, traMADol  Assessment/ Plan:  Jocelyn Wells is a 66 y.o.  female  with a past medical history of diabetes mellitus type 2, nocturnal oxygen use, chronic diastolic CHF, chronic kidney disease stage IV, COPD, depression, anemia who came to the ER with chief complaint of tiredness and shortness of breath.    Acute kidney injury with chronic kidney disease stage IV with baseline creatinine 2.5 and GFR of 23 on 12/07/21. Acute kidney injury likely secondary to increased fluid status and diuretic use. Chronic kidney disease secondary to  diabetes. Received IV Furosemide with desired results. Patient has returned to baseline function. Recommend discharging on Torsemide 20 mg daily. Will follow up in office in 1-2 weeks.   Lab Results  Component Value Date   CREATININE 2.96 (H) 01/04/2022   CREATININE 2.84 (H) 01/03/2022   CREATININE 2.62 (H) 01/02/2022    Intake/Output Summary (Last 24 hours) at 01/04/2022 1407 Last data filed at 01/04/2022 0330 Gross per 24 hour  Intake 600 ml  Output 1400 ml  Net -800 ml   2.  Hypertension with chronic kidney disease.  Home regimen includes carvedilol and torsemide.  Patient currently prescribed carvedilol and losartan.  Recommend restarting torsemide 20 mg daily at discharge.  3. Anemia of chronic kidney disease Lab Results  Component Value Date   HGB 8.6 (L) 01/04/2022   Hemoglobin below desired target..  Known history of iron deficiency anemia  4. Secondary Hyperparathyroidism: presumed  Lab Results  Component Value Date   CALCIUM 8.1 (L) 01/04/2022   CAION 1.08 (L) 01/29/2015   PHOS 3.8 08/20/2021    Calcium and phosphorus within range.  We will continue to monitor bone minerals during this admission.   LOS: 2   11/27/20232:07 PM

## 2022-01-04 NOTE — Consult Note (Addendum)
WOC Nurse Consult Note: Reason for Consult:Consult requested for bilat legs; both have mod amt edema. Left anterior calf with chronic full thickness wound, 100% red and moist, .8X1X.3cm, small amt tan drainage. Pt has been using Silver alginate dressings 3 times a week prior to admission.  Right anterior calf with full thickness wound where a previous blister ruptured. 12X12X.2cm, red and moist with mod amt yellow drainage Dressing procedure/placement/frequency: Topical treatment orders provided for bedside nurses to perform as follows: 1. Wound care to left leg wound: apply a piece of Aquacel to left leg Q M/W/F, then cover with foam dressing. Moisten with NS each time to assist with removal. (Change foam dressing Q 3 days or PRN soiling.)  2. Wound care to right leg: apply double-folded xeroform gauze to right leg Q M/W/F and cover with ABD pad and kerlex and ace wrap. Please re-consult if further assistance is needed.  Thank-you,  Julien Girt MSN, [place], [place], [place], [place]

## 2022-01-04 NOTE — Inpatient Diabetes Management (Signed)
Inpatient Diabetes Program Recommendations  AACE/ADA: New Consensus Statement on Inpatient Glycemic Control (2015)  Target Ranges:  Prepandial:   less than 140 mg/dL      Peak postprandial:   less than 180 mg/dL (1-2 hours)      Critically ill patients:  140 - 180 mg/dL   Lab Results  Component Value Date   GLUCAP 112 (H) 01/04/2022   HGBA1C 10.2 (H) 11/23/2021    Latest Reference Range & Units 01/03/22 07:30 01/03/22 11:13 01/03/22 15:45 01/03/22 20:35 01/04/22 04:07 01/04/22 08:37 01/04/22 12:11  Glucose-Capillary 70 - 99 mg/dL 141 (H) 151 (H) 176 (H) 227 (H) 194 (H) 125 (H) 112 (H)  (H): Data is abnormally high  Diabetes history: DM2 Outpatient Diabetes medications: Omnipod insulin pump with Novolog, FreeStyle Libre CGM, Trulicity 3 mg Qweek Current orders for Inpatient glycemic control: Insulin pump  Inpatient Diabetes Program Recommendations:   Sees Dr. Gabriel Carina for endocrinology with last office visit 12/24/21 Basal 1.35 units/hour  Total Basal: 32.4units/24 hours   Insulin to Carb ratio: 1:5 grams (1 unit per 5 grams of carbs) Insulin Sensitivity: 1:20 mg/dl (1 unit drops glucose 20 mg/dl) Target Glucose 120 mg/dl  Will continue to follow while in the hospital.  Thank you, Nani Gasser. Izabellah Dadisman, RN, MSN, CDE  Diabetes Coordinator Inpatient Glycemic Control Team Team Pager [phone] (8am-5pm) 01/04/2022 12:58 PM

## 2022-01-04 NOTE — Progress Notes (Signed)
PT refuses oxygen for transport haome. States her oxygen is at home and she will be fine until she gets home.

## 2022-01-04 NOTE — Consult Note (Signed)
   Heart Failure Nurse Navigator Note  HFmrEF 45 to 50%.  Grade 2 diastolic dysfunction.  Mild left atrial enlargement.  She presented to the emergency room with complaints of fatigue, dyspnea and lower extremity edema.  Chest x-ray revealed cardiomegaly with mild central pulmonary vascular congestion.  BNP 2508.  Comorbidities:  Nocturnal use of O2 Chronic kidney disease stage IV COPD Depression Anemia Hypertension  Medications:  Icthaband 5 mg 2 times a day Atorvastatin 40 mg daily Carvedilol 25 mg 2 times a day Plavix 75 mg daily Furosemide 40 mg IV twice a day Losartan 50 mg   Labs:  Sodium 141, potassium 5.1, chloride 106, CO2 27, BUN 41, creatinine 2.96, estimated GFR 17. Weight is 111.6 kg Intake 840 mL Output 1950 mL Blood pressure 123/61  Initial meeting with patient and her husband Richardson Landry who was at the bedside.  Discussed how she takes care of herself at home.  She does not weigh herself on a daily basis, stressed the importance of daily weights and reporting 2-3 3 pound weight gain overnight or 5 pounds within the week.  They do not use salt or add it when cooking.  They admit to eating out once a week, she states they usually like to go to a [employer].  Discussed making wiser choices when eating at the [employer] and foods to avoid.  Also discussed fluid restriction of no more than 64 ounces daily and what constitutes a liquid.   She is compliant with her medications, she states that her son sets up her weekly medication box.  Discussed the ventricle health program with the patient and she is interested, was given printed information and per her request application was sent to the company.  No further questions.  She has follow-up in the outpatient heart failure clinic on December 5 at 2:30 in the afternoon.  Pricilla Riffle RN [facility]

## 2022-01-04 NOTE — Discharge Instructions (Signed)

## 2022-01-04 NOTE — Progress Notes (Addendum)
Physical Therapy Treatment Patient Details Name: TWANIA BUJAK MRN: [id number] DOB: [birth year] Today's Date: 01/04/2022   History of Present Illness Patient is a 66 y.o. female with medical history of diabetes mellitus, nocturnal supplemental oxygen use, chronic CHF, CKD stage IV, COPD, depression, RLE DVT, anemia. She presents to the emergency department with fatigue and dyspnea. Found to have symptomatic anemia with hemoglobin 6.7 on arrival, acute on chronic diastolic heart failure. R knee pain and swelling with X-ray from 11/15 showing hematoma    PT Comments    Patient on room air on arrival to the room. She reports taking her oxygen off to put on her shirt and forgetting to put oxygen back in place. Sp02 84% initially and increased to the low 90's with replacement of 2 L and cues for breathing techniques. Oxygen removed initially during mobility efforts and monitored with functional activity. Cues for breathing techniques and cues to stand closer to rolling walker with ambulation. Overall, activity tolerance has improved since yesterday with increased gait distance. Recommend to continue PT to maximize independence and facilitate return to prior level of function.    Patient Saturations on Room Air at Rest = 90%  Patient Saturations on [employer] while Ambulating = 84-85%  Patient Saturations on 2 Liters of oxygen while Ambulating = 91%   Recommendations for follow up therapy are one component of a multi-disciplinary discharge planning process, led by the attending physician.  Recommendations may be updated based on patient status, additional functional criteria and insurance authorization.  Follow Up Recommendations  Home health PT     Assistance Recommended at Discharge Set up Supervision/Assistance  Patient can return home with the following Assist for transportation;Help with stairs or ramp for entrance;Assistance with cooking/housework   Equipment Recommendations  None  recommended by PT    Recommendations for Other Services       Precautions / Restrictions Precautions Precautions: Fall Restrictions Weight Bearing Restrictions: No     Mobility  Bed Mobility Overal bed mobility: Needs Assistance Bed Mobility: Supine to Sit, Sit to Supine     Supine to sit: Supervision, HOB elevated Sit to supine: Supervision, HOB elevated   General bed mobility comments: increased time and effort required with mild fatigue noted    Transfers Overall transfer level: Needs assistance Equipment used: Rolling walker (2 wheels) Transfers: Sit to/from Stand Sit to Stand: Supervision           General transfer comment: cues for safety. no physical assistance required for standing    Ambulation/Gait Ambulation/Gait assistance: Supervision Gait Distance (Feet): 100 Feet Assistive device: Rolling walker (2 wheels) Gait Pattern/deviations: Step-through pattern, Decreased stride length, Trunk flexed Gait velocity: decreased     General Gait Details: cues to stand closer to rolling walker for support. Sp02 84-85% on room air and 91% on 2 L02. reviewed pursed lip breathing techniques and energy conservation strategies to use in home setting   Stairs             Wheelchair Mobility    Modified Rankin (Stroke Patients Only)       Balance Overall balance assessment: Needs assistance Sitting-balance support: Feet supported Sitting balance-Leahy Scale: Good     Standing balance support: Bilateral upper extremity supported, Reliant on assistive device for balance Standing balance-Leahy Scale: Fair                              Cognition Arousal/Alertness: Awake/alert Behavior During  Therapy: WFL for tasks assessed/performed Overall Cognitive Status: Within Functional Limits for tasks assessed                                          Exercises      General Comments General comments (skin integrity, edema,  etc.): on arrival to room, patient was on room air (had taken off her oxygen to put on her shirt and forgot to put the 02 back in place) with Sp02 84%. placed patient back on the 2 L with cues for breathing techniques with Sp02 up to 91%. removed 02 for part of ambulation to get walking Sp02 on room air vs with supplemental 02      Pertinent Vitals/Pain Pain Assessment Pain Assessment: Faces Faces Pain Scale: Hurts little more Pain Location: distal RLE with movement Pain Descriptors / Indicators: Sore Pain Intervention(s): Premedicated before session, Monitored during session    Home Living                          Prior Function            PT Goals (current goals can now be found in the care plan section) Acute Rehab PT Goals Patient Stated Goal: to go home PT Goal Formulation: With patient Time For Goal Achievement: 01/17/22 Potential to Achieve Goals: Good Progress towards PT goals: Progressing toward goals    Frequency    Min 2X/week      PT Plan Current plan remains appropriate    Co-evaluation              AM-PAC PT "6 Clicks" Mobility   Outcome Measure  Help needed turning from your back to your side while in a flat bed without using bedrails?: A Little Help needed moving from lying on your back to sitting on the side of a flat bed without using bedrails?: A Little Help needed moving to and from a bed to a chair (including a wheelchair)?: A Little Help needed standing up from a chair using your arms (e.g., wheelchair or bedside chair)?: A Little Help needed to walk in hospital room?: A Little Help needed climbing 3-5 steps with a railing? : A Lot 6 Click Score: 17    End of Session Equipment Utilized During Treatment: Gait belt;Oxygen Activity Tolerance: Patient tolerated treatment well Patient left: in bed;with call bell/phone within reach Nurse Communication: Mobility status PT Visit Diagnosis: Unsteadiness on feet (R26.81);Muscle  weakness (generalized) (M62.81)     Time: 5465-0354 PT Time Calculation (min) (ACUTE ONLY): 27 min  Charges:  $Gait Training: 8-22 mins $Therapeutic Activity: 8-22 mins                    Minna Merritts, PT, MPT    Percell Locus 01/04/2022, 12:44 PM

## 2022-01-05 ENCOUNTER — Telehealth: Payer: Self-pay | Admitting: *Deleted

## 2022-01-05 ENCOUNTER — Ambulatory Visit: Payer: Medicare Other | Admitting: Podiatry

## 2022-01-05 ENCOUNTER — Telehealth: Payer: Self-pay | Admitting: Cardiology

## 2022-01-05 DIAGNOSIS — I110 Hypertensive heart disease with heart failure: Secondary | ICD-10-CM | POA: Diagnosis not present

## 2022-01-05 DIAGNOSIS — N184 Chronic kidney disease, stage 4 (severe): Secondary | ICD-10-CM | POA: Diagnosis not present

## 2022-01-05 DIAGNOSIS — E1151 Type 2 diabetes mellitus with diabetic peripheral angiopathy without gangrene: Secondary | ICD-10-CM | POA: Diagnosis not present

## 2022-01-05 DIAGNOSIS — I70248 Atherosclerosis of native arteries of left leg with ulceration of other part of lower left leg: Secondary | ICD-10-CM | POA: Diagnosis not present

## 2022-01-05 DIAGNOSIS — I5032 Chronic diastolic (congestive) heart failure: Secondary | ICD-10-CM | POA: Diagnosis not present

## 2022-01-05 DIAGNOSIS — E1122 Type 2 diabetes mellitus with diabetic chronic kidney disease: Secondary | ICD-10-CM | POA: Diagnosis not present

## 2022-01-05 NOTE — Patient Outreach (Signed)
  Care Coordination [facility] Note Transition Care Management Follow-up Telephone Call Date of discharge and from where: [facility] 01/04/2022 How have you been since you were released from the hospital? I feel a little tired Any questions or concerns? No  Items Reviewed: Did the pt receive and understand the discharge instructions provided? Yes  Medications obtained and verified? Yes  Other? N  Any new allergies since your discharge? No  Dietary orders reviewed? Yes Heart Healthy Diet Do you have support at home? Yes   Home Care and Equipment/Supplies: Were home health services ordered? yes If so, what is the name of the agency? [facility]  Has the agency set up a time to come to the patient's home? No They had been coming before patient was admitted to hospital. She will follow up with them if not heard from them today Were any new equipment or medical supplies ordered?  No What is the name of the medical supply agency? n Were you able to get the supplies/equipment? not applicable Do you have any questions related to the use of the equipment or supplies? No  Functional Questionnaire: (I = Independent and D = Dependent) ADLs: I  Bathing/Dressing- I  Meal Prep- D  Eating- I  Maintaining continence- I  Transferring/Ambulation- I  Managing Meds- I  Follow up appointments reviewed:  PCP Hospital f/u appt confirmed? Yes  Tally Joe FNP [phone] 2:00 PM [facility] f/u appt confirmed? No   Are transportation arrangements needed? No  If their condition worsens, is the pt aware to call PCP or go to the Emergency Dept.? Yes Was the patient provided with contact information for the PCP's office or ED? Yes Was to pt encouraged to call back with questions or concerns? Yes  SDOH assessments and interventions completed:   Yes SDOH Screenings   Food Insecurity: No Food Insecurity (01/05/2022)  Housing: Low Risk  (01/05/2022)  Transportation Needs: No Transportation Needs  (01/05/2022)  Utilities: Not At Risk (01/02/2022)  Alcohol Screen: Low Risk  (12/04/2021)  Depression (PHQ2-9): Low Risk  (12/11/2021)  Recent Concern: Depression (PHQ2-9) - High Risk (12/04/2021)  Financial Resource Strain: Low Risk  (12/11/2021)  Physical Activity: Inactive (12/11/2021)  Social Connections: Moderately Isolated (12/11/2021)  Stress: Stress Concern Present (12/11/2021)  Tobacco Use: High Risk (01/01/2022)     Care Coordination Interventions Activated:  Yes   Care Coordination Interventions:  No Care Coordination interventions needed at this time. Patient is being follow by [facility] for CCM   Encounter Outcome:  Pt. Visit Completed    [facility] [phone]

## 2022-01-05 NOTE — Telephone Encounter (Signed)
   Pre-operative Risk Assessment    Patient Name: Jocelyn Wells  DOB: 09/05/1955 MRN: [id number]      Request for Surgical Clearance    Procedure:   Colonoscopy   Date of Surgery:  Clearance 02/15/22                                 Surgeon:  Dr Marius Ditch Surgeon's Group or Practice Name:  [facility] Phone number:  [phone] Fax number:  [phone]   Type of Clearance Requested:   - Pharmacy:  Hold Clopidogrel (Plavix) instructions   Type of Anesthesia:  Not Indicated   Additional requests/questions:    [name]   01/05/2022, 9:26 AM

## 2022-01-06 ENCOUNTER — Telehealth: Payer: Self-pay

## 2022-01-06 DIAGNOSIS — I5032 Chronic diastolic (congestive) heart failure: Secondary | ICD-10-CM | POA: Diagnosis not present

## 2022-01-06 DIAGNOSIS — E113513 Type 2 diabetes mellitus with proliferative diabetic retinopathy with macular edema, bilateral: Secondary | ICD-10-CM | POA: Diagnosis not present

## 2022-01-06 DIAGNOSIS — E1122 Type 2 diabetes mellitus with diabetic chronic kidney disease: Secondary | ICD-10-CM | POA: Diagnosis not present

## 2022-01-06 DIAGNOSIS — N184 Chronic kidney disease, stage 4 (severe): Secondary | ICD-10-CM | POA: Diagnosis not present

## 2022-01-06 DIAGNOSIS — I110 Hypertensive heart disease with heart failure: Secondary | ICD-10-CM | POA: Diagnosis not present

## 2022-01-06 DIAGNOSIS — I70248 Atherosclerosis of native arteries of left leg with ulceration of other part of lower left leg: Secondary | ICD-10-CM | POA: Diagnosis not present

## 2022-01-06 DIAGNOSIS — E1151 Type 2 diabetes mellitus with diabetic peripheral angiopathy without gangrene: Secondary | ICD-10-CM | POA: Diagnosis not present

## 2022-01-06 NOTE — Telephone Encounter (Signed)
Ok for verbal orders.    Jaimere Feutz Simmons-Robinson, MD  [facility]  

## 2022-01-06 NOTE — Telephone Encounter (Signed)
Verbal orders given  

## 2022-01-06 NOTE — Telephone Encounter (Signed)
   Patient Name: Jocelyn Wells  DOB: 01-08-1956 MRN: [id number]  Primary Cardiologist: None  Chart reviewed as part of pre-operative protocol coverage. Hx of NICM, CVA, PAD, HTN. Pharmacy clearance only.  Shahrzad A Woolman is on Plavix due to peripheral arterial disease. She follows with [facility] Dr. Delana Meyer. Clearance to hold will need to be addressed by their office  Of note, she is on Eliquis '5mg'$  BID due to chronic DVT of RLE which will need to be addressed by Dr. Delana Meyer as well.   I will route this recommendation to the requesting party via Epic fax function and remove from pre-op pool.  Please call with questions.  Loel Dubonnet, NP 01/06/2022, 7:56 AM

## 2022-01-06 NOTE — Telephone Encounter (Signed)
Copied from [place] [id number]. Topic: Quick Communication - Home Health Verbal Orders >> Jan 06, 2022  3:10 PM Everette C wrote: Caller/Agency: [facility] / [facility]ie [facility]aker Number: [phone] Requesting OT/PT/Skilled Nursing/Social Work/Speech Therapy: PT Frequency: 1w1 2w2 1w4

## 2022-01-07 ENCOUNTER — Inpatient Hospital Stay: Payer: Medicare Other

## 2022-01-07 ENCOUNTER — Telehealth: Payer: Self-pay

## 2022-01-07 VITALS — BP 124/55 | HR 75 | Temp 96.3°F | Resp 18

## 2022-01-07 DIAGNOSIS — I5020 Unspecified systolic (congestive) heart failure: Secondary | ICD-10-CM | POA: Diagnosis not present

## 2022-01-07 DIAGNOSIS — I129 Hypertensive chronic kidney disease with stage 1 through stage 4 chronic kidney disease, or unspecified chronic kidney disease: Secondary | ICD-10-CM | POA: Diagnosis not present

## 2022-01-07 DIAGNOSIS — N184 Chronic kidney disease, stage 4 (severe): Secondary | ICD-10-CM | POA: Diagnosis not present

## 2022-01-07 DIAGNOSIS — J449 Chronic obstructive pulmonary disease, unspecified: Secondary | ICD-10-CM | POA: Diagnosis not present

## 2022-01-07 DIAGNOSIS — E1122 Type 2 diabetes mellitus with diabetic chronic kidney disease: Secondary | ICD-10-CM | POA: Diagnosis not present

## 2022-01-07 DIAGNOSIS — I110 Hypertensive heart disease with heart failure: Secondary | ICD-10-CM | POA: Diagnosis not present

## 2022-01-07 DIAGNOSIS — Z794 Long term (current) use of insulin: Secondary | ICD-10-CM

## 2022-01-07 DIAGNOSIS — E1151 Type 2 diabetes mellitus with diabetic peripheral angiopathy without gangrene: Secondary | ICD-10-CM | POA: Diagnosis not present

## 2022-01-07 DIAGNOSIS — E1159 Type 2 diabetes mellitus with other circulatory complications: Secondary | ICD-10-CM | POA: Diagnosis not present

## 2022-01-07 DIAGNOSIS — N189 Chronic kidney disease, unspecified: Secondary | ICD-10-CM | POA: Diagnosis not present

## 2022-01-07 DIAGNOSIS — D75839 Thrombocytosis, unspecified: Secondary | ICD-10-CM | POA: Diagnosis not present

## 2022-01-07 DIAGNOSIS — I5032 Chronic diastolic (congestive) heart failure: Secondary | ICD-10-CM | POA: Diagnosis not present

## 2022-01-07 DIAGNOSIS — I70248 Atherosclerosis of native arteries of left leg with ulceration of other part of lower left leg: Secondary | ICD-10-CM | POA: Diagnosis not present

## 2022-01-07 DIAGNOSIS — D508 Other iron deficiency anemias: Secondary | ICD-10-CM

## 2022-01-07 DIAGNOSIS — D72829 Elevated white blood cell count, unspecified: Secondary | ICD-10-CM | POA: Diagnosis not present

## 2022-01-07 DIAGNOSIS — D649 Anemia, unspecified: Secondary | ICD-10-CM | POA: Diagnosis not present

## 2022-01-07 MED ORDER — SODIUM CHLORIDE 0.9 % IV SOLN
200.0000 mg | INTRAVENOUS | Status: DC
  Administered 2022-01-07: 200 mg via INTRAVENOUS
  Filled 2022-01-07: qty 200

## 2022-01-07 MED ORDER — SODIUM CHLORIDE 0.9 % IV SOLN
Freq: Once | INTRAVENOUS | Status: AC
  Filled 2022-01-07: qty 250

## 2022-01-07 NOTE — Telephone Encounter (Signed)
Per secure chat with Dr. Delana Meyer in regard to patient stopping Eliquis and Plavix-"she is OK to stop both 5 days before and restart as soon as possible after the procedure".  Patient has been contacted to advise her stop dates for Eliquis and Plavix.  Her procedure is scheduled for 02/15/22 and she will need to be advised to stop Eliquis and Plavix on 02/10/22.  Thanks,  [place], [place]

## 2022-01-08 NOTE — Progress Notes (Deleted)
      Established patient visit   Patient: Jocelyn Wells   DOB: 1955-05-30   66 y.o. Female  MRN: [id number] Visit Date: 01/12/2022  Today's healthcare provider: Gwyneth Sprout, FNP   No chief complaint on file.  Subjective    HPI  Follow up Hospitalization  Patient was admitted to [facility] on 11/24 and discharged on 11/27. She was treated for anemia. Treatment for this included chest xray, IV lasix, blood transfusion, 2L O2. She reports {excellent/good/fair:19992} compliance with treatment. She reports this condition is {resolved/improved/worsened:23923}.  ----------------------------------------------------------------------------------------- -   Medications: Outpatient Medications Prior to Visit  Medication Sig   albuterol (VENTOLIN HFA) 108 (90 Base) MCG/ACT inhaler TAKE 2 PUFFS BY MOUTH EVERY 6 HOURS AS NEEDED FOR WHEEZE OR SHORTNESS OF BREATH (Patient taking differently: Inhale 2 puffs into the lungs every 6 (six) hours as needed for wheezing or shortness of breath.)   apixaban (ELIQUIS) 5 MG TABS tablet Take 1 tablet (5 mg total) by mouth 2 (two) times daily.   atorvastatin (LIPITOR) 40 MG tablet Take 1 tablet (40 mg total) by mouth daily.   buPROPion (WELLBUTRIN SR) 150 MG 12 hr tablet Take 1 tablet (150 mg total) by mouth 2 (two) times daily.   carvedilol (COREG) 25 MG tablet Take 1 tablet (25 mg total) by mouth 2 (two) times daily with a meal.   clopidogrel (PLAVIX) 75 MG tablet Take 1 tablet (75 mg total) by mouth daily.   Dulaglutide 3 MG/0.5ML SOPN Inject into the skin once a week.   fluticasone (FLONASE) 50 MCG/ACT nasal spray Place 2 sprays into both nostrils daily.   gabapentin (NEURONTIN) 300 MG capsule Take 300 mg by mouth 2 (two) times daily.   glucose blood test strip OneTouch Verio strips   insulin aspart (NOVOLOG) 100 UNIT/ML injection Insulin pump   Insulin Disposable Pump (OMNIPOD DASH PODS, GEN 4,) MISC USE 1 POD EACH EVERY 72    HOURS   Insulin Human  (INSULIN PUMP) SOLN Per endocrinoloy   loratadine (CLARITIN) 10 MG tablet Take 1 tablet by mouth daily.   losartan (COZAAR) 50 MG tablet Take 1 tablet (50 mg total) by mouth daily.   meclizine (ANTIVERT) 12.5 MG tablet Take 1 tablet (12.5 mg total) by mouth 3 (three) times daily as needed for dizziness.   pantoprazole (PROTONIX) 40 MG tablet Take 1 tablet (40 mg total) by mouth 2 (two) times daily.   PARoxetine (PAXIL) 40 MG tablet Take 1 tablet (40 mg total) by mouth daily.   silver sulfADIAZINE (SILVADENE) 1 % cream Apply 1 Application topically daily.   Spacer/Aero-Holding [facility] DIAMOND) MISC Use with inhaler to ensure medication is delivered throughout lungs   torsemide (DEMADEX) 20 MG tablet Take 20 mg by mouth daily.   traMADol (ULTRAM) 50 MG tablet Take 1 tablet (50 mg total) by mouth every 12 (twelve) hours as needed.   No facility-administered medications prior to visit.    Review of Systems  {Labs  Heme  Chem  Endocrine  Serology  Results Review (optional):23779}   Objective    There were no vitals taken for this visit. {Show previous vital signs (optional):23777}  Physical Exam  ***  No results found for any visits on 01/12/22.  Assessment & Plan     ***  No follow-ups on file.      {provider attestation***:1}   Gwyneth Sprout, [place] [phone] (phone) [phone] (fax)  [place]

## 2022-01-11 DIAGNOSIS — E1122 Type 2 diabetes mellitus with diabetic chronic kidney disease: Secondary | ICD-10-CM | POA: Diagnosis not present

## 2022-01-11 DIAGNOSIS — E1151 Type 2 diabetes mellitus with diabetic peripheral angiopathy without gangrene: Secondary | ICD-10-CM | POA: Diagnosis not present

## 2022-01-11 DIAGNOSIS — I110 Hypertensive heart disease with heart failure: Secondary | ICD-10-CM | POA: Diagnosis not present

## 2022-01-11 DIAGNOSIS — I70248 Atherosclerosis of native arteries of left leg with ulceration of other part of lower left leg: Secondary | ICD-10-CM | POA: Diagnosis not present

## 2022-01-11 DIAGNOSIS — I5032 Chronic diastolic (congestive) heart failure: Secondary | ICD-10-CM | POA: Diagnosis not present

## 2022-01-11 DIAGNOSIS — N184 Chronic kidney disease, stage 4 (severe): Secondary | ICD-10-CM | POA: Diagnosis not present

## 2022-01-11 NOTE — Progress Notes (Unsigned)
Virtual Visit via Video Note  I connected with Jocelyn Wells on 01/13/22 at  8:00 AM EST by a video enabled telemedicine application and verified that I am speaking with the correct person using two identifiers.  Location: Patient: home Provider: office Persons participated in the visit- patient, provider    I discussed the limitations of evaluation and management by telemedicine and the availability of in person appointments. The patient expressed understanding and agreed to proceed.    I discussed the assessment and treatment plan with the patient. The patient was provided an opportunity to ask questions and all were answered. The patient agreed with the plan and demonstrated an understanding of the instructions.   The patient was advised to call back or seek an in-person evaluation if the symptoms worsen or if the condition fails to improve as anticipated.  I provided 10 minutes of non-face-to-face time during this encounter.   Norman Clay, MD    [facility] MD/PA/NP OP Progress Note  01/13/2022 8:21 AM Jocelyn Wells  MRN:  [id number]  Chief Complaint:  Chief Complaint  Patient presents with   Depression   HPI:  - She as admitted for shortness of breath in the setting of symptomatic chronic anemia, secondary to CKD, iron deficiency, and acute on chronic diastolic CHF. Blood transfusion was given.   This is a follow-up appointment for depression.  She states that she was admitted since the last visit.  She has been doing better since then.  She feels depressed and anxious at times when she feels sick physically.  She does not like to go to the hospital as she does not like to leave her dogs.  She enjoys taking care of them.  She had a good Thanksgiving with her husband, children and her grandchildren.  She sleeps well.  She denies change in appetite.  She denies SI.  She feels comfortable to stay at the current medication regimen.  She is willing to see a therapist.   Daily routine:  she does not have any routine. 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 She lost job as she was there "so long." She got disability for depression, followed by stroke.  Support: best friend, husband Household: husband, two dogs Marital status: married for 24 years, married four times Number of children: 2 and 2 step children. She has 3 grandchildren (age 33, 33, 67) Her parents were separated, and she lived with her mother, brother. Both of her parents passed away   Visit Diagnosis:    ICD-10-CM   1. MDD (major depressive disorder), recurrent, in partial remission ([place])  F33.41     2. PTSD (post-traumatic stress disorder)  F43.10       Past Psychiatric History: Please see initial evaluation for full details. I have reviewed the history. No updates at this time.     Past Medical History:  Past Medical History:  Diagnosis Date   Anemia    Anxiety    Cardiomyopathy ([place])    new to her Jan 2017   CHF (congestive heart failure) (HCC)    Chronic kidney disease    COPD (chronic obstructive pulmonary disease) ([place])    Depression    Diabetes mellitus, type II ([place])    HTN (hypertension)    Leukocytosis    Obesity    Osteoporosis    PONV (postoperative nausea and vomiting)    Stroke [facility])    Jan 2017  Past Surgical History:  Procedure Laterality Date   ABDOMINAL HYSTERECTOMY     ANKLE FRACTURE SURGERY Left 02/09/2000   BILATERAL SALPINGOOPHORECTOMY  02/08/1998   CARDIAC CATHETERIZATION N/A 03/25/2015   Procedure: Right/Left Heart Cath and Coronary Angiography;  Surgeon: Belva Crome, MD;  Location: [place] CV LAB;  Service: Cardiovascular;  Laterality: N/A;   CESAREAN SECTION  02/09/1976   COLONOSCOPY WITH PROPOFOL N/A 09/22/2016   Procedure: COLONOSCOPY WITH PROPOFOL;  Surgeon: Manya Silvas, MD;  Location: [facility] ENDOSCOPY;  Service: Endoscopy;  Laterality: N/A;   EP IMPLANTABLE DEVICE N/A  08/05/2015   Procedure: Loop Recorder Insertion;  Surgeon: Deboraha Sprang, MD;  Location: [place] [facility] LAB;  Service: Cardiovascular;  Laterality: N/A;   ESOPHAGOGASTRODUODENOSCOPY (EGD) WITH PROPOFOL N/A 09/22/2016   Procedure: ESOPHAGOGASTRODUODENOSCOPY (EGD) WITH PROPOFOL;  Surgeon: Manya Silvas, MD;  Location: [facility] ENDOSCOPY;  Service: Endoscopy;  Laterality: N/A;   ESOPHAGOGASTRODUODENOSCOPY (EGD) WITH PROPOFOL N/A 12/08/2016   Procedure: ESOPHAGOGASTRODUODENOSCOPY (EGD) WITH PROPOFOL;  Surgeon: Manya Silvas, MD;  Location: [facility] ENDOSCOPY;  Service: Endoscopy;  Laterality: N/A;   FRACTURE SURGERY     KNEE ARTHROSCOPY Left 02/09/2003   LOWER EXTREMITY ANGIOGRAPHY Left 08/12/2021   Procedure: Lower Extremity Angiography;  Surgeon: Katha Cabal, MD;  Location: [place] [facility];  Service: Cardiovascular;  Laterality: Left;   TEE WITHOUT CARDIOVERSION N/A 01/31/2015   Procedure: TRANSESOPHAGEAL ECHOCARDIOGRAM (TEE);  Surgeon: Lelon Perla, MD;  Location: [facility] ENDOSCOPY;  Service: Cardiovascular;  Laterality: N/A;   TEMPORARY DIALYSIS CATHETER N/A 08/14/2021   Procedure: TEMPORARY DIALYSIS CATHETER;  Surgeon: Katha Cabal, MD;  Location: [place] [facility];  Service: Cardiovascular;  Laterality: N/A;   TUBAL LIGATION  02/09/1976   ULNAR NERVE TRANSPOSITION  02/08/2006    Family Psychiatric History: Please see initial evaluation for full details. I have reviewed the history. No updates at this time.     Family History:  Family History  Problem Relation Age of Onset   Heart disease Mother        died from CHF   Asthma Mother    Diabetes Mother    Heart disease Father    Aneurysm Father    COPD Brother    Diabetes Brother    Alcohol abuse Paternal Aunt    Cancer Maternal Grandmother        unknownorigin   Anemia Neg Hx    Arrhythmia Neg Hx    Clotting disorder Neg Hx    Fainting Neg Hx    Heart attack Neg Hx    Heart failure Neg Hx     Hyperlipidemia Neg Hx    Hypertension Neg Hx     Social History:  Social History   Socioeconomic History   Marital status: Married    Spouse name: Not on file   Number of children: 2   Years of education: Not on file   Highest education level: Bachelor's degree (e.g., BA, AB, BS)  Occupational History   Occupation: disabled    Comment: retired  Tobacco Use   Smoking status: Every Day    Packs/day: 0.50    Years: 47.00    Total pack years: 23.50    Types: Cigarettes    Start date: 02/08/1974   Smokeless tobacco: Never   Tobacco comments:    1-3 cigarettes a day. 12/09/2021 khj  Vaping Use   Vaping Use: Never used  Substance and Sexual Activity   Alcohol use: No    Alcohol/week:  0.0 standard drinks of alcohol   Drug use: No   Sexual activity: Yes    Birth control/protection: None  Other Topics Concern   Not on file  Social History Narrative   Lives at home with stepson and husband, dogs and cats   Caffeine  Drinks sweet tea.   Right handed.    Social Determinants of Health   Financial Resource Strain: Low Risk  (12/11/2021)   Overall Financial Resource Strain (CARDIA)    Difficulty of Paying Living Expenses: Not very hard  Food Insecurity: No Food Insecurity (01/05/2022)   Hunger Vital Sign    Worried About Running Out of Food in the Last Year: Never true    Ran Out of Food in the Last Year: Never true  Transportation Needs: No Transportation Needs (01/05/2022)   PRAPARE - Hydrologist (Medical): No    Lack of Transportation (Non-Medical): No  Physical Activity: Inactive (12/11/2021)   Exercise Vital Sign    Days of Exercise per Week: 0 days    Minutes of Exercise per Session: 0 min  Stress: Stress Concern Present (12/11/2021)   [place]    Feeling of Stress : To some extent  Social Connections: Moderately Isolated (12/11/2021)   Social Connection and Isolation Panel  [NHANES]    Frequency of Communication with Friends and Family: More than three times a week    Frequency of Social Gatherings with Friends and Family: Once a week    Attends Religious Services: Never    Marine scientist or Organizations: No    Attends Archivist Meetings: Never    Marital Status: Married    Allergies:  Allergies  Allergen Reactions   Codeine Nausea And Vomiting   Ivp Dye [Iodinated Contrast Media]     Patients states the IV Dye shuts her kidneys down    Metabolic Disorder Labs: Lab Results  Component Value Date   HGBA1C 10.2 (H) 11/23/2021   MPG 246 05/31/2020   MPG 303.44 10/06/2017   No results found for: "PROLACTIN" Lab Results  Component Value Date   CHOL 107 12/04/2021   TRIG 112 12/04/2021   HDL 36 (L) 12/04/2021   CHOLHDL 3.0 12/04/2021   VLDL 64 (H) 10/06/2017   LDLCALC 50 12/04/2021   LDLCALC 186 (H) 10/20/2020   Lab Results  Component Value Date   TSH 2.977 10/06/2021    Therapeutic Level Labs: No results found for: "LITHIUM" No results found for: "VALPROATE" No results found for: "CBMZ"  Current Medications: Current Outpatient Medications  Medication Sig Dispense Refill   albuterol (VENTOLIN HFA) 108 (90 Base) MCG/ACT inhaler TAKE 2 PUFFS BY MOUTH EVERY 6 HOURS AS NEEDED FOR WHEEZE OR SHORTNESS OF BREATH (Patient taking differently: Inhale 2 puffs into the lungs every 6 (six) hours as needed for wheezing or shortness of breath.) 8.5 each 2   apixaban (ELIQUIS) 5 MG TABS tablet Take 1 tablet (5 mg total) by mouth 2 (two) times daily. 180 tablet 3   atorvastatin (LIPITOR) 40 MG tablet Take 1 tablet (40 mg total) by mouth daily. 90 tablet 3   buPROPion (WELLBUTRIN SR) 150 MG 12 hr tablet Take 1 tablet (150 mg total) by mouth 2 (two) times daily. 60 tablet 1   carvedilol (COREG) 25 MG tablet Take 1 tablet (25 mg total) by mouth 2 (two) times daily with a meal. 60 tablet 0   clopidogrel (PLAVIX) 75 MG  tablet Take 1 tablet  (75 mg total) by mouth daily. 90 tablet 3   Dulaglutide 3 MG/0.5ML SOPN Inject into the skin once a week.     fluticasone (FLONASE) 50 MCG/ACT nasal spray Place 2 sprays into both nostrils daily. (Patient not taking: Reported on 01/12/2022) 16 g 6   gabapentin (NEURONTIN) 300 MG capsule Take 300 mg by mouth 2 (two) times daily.     glucose blood test strip OneTouch Verio strips     insulin aspart (NOVOLOG) 100 UNIT/ML injection Insulin pump     Insulin Disposable Pump (OMNIPOD DASH PODS, GEN 4,) MISC USE 1 POD EACH EVERY 72    HOURS     Insulin Human (INSULIN PUMP) SOLN Per endocrinoloy     loratadine (CLARITIN) 10 MG tablet Take 1 tablet by mouth daily.     losartan (COZAAR) 50 MG tablet Take 1 tablet (50 mg total) by mouth daily. 90 tablet 0   meclizine (ANTIVERT) 12.5 MG tablet Take 1 tablet (12.5 mg total) by mouth 3 (three) times daily as needed for dizziness. (Patient not taking: Reported on 01/12/2022) 30 tablet 0   pantoprazole (PROTONIX) 40 MG tablet Take 1 tablet (40 mg total) by mouth 2 (two) times daily. 60 tablet 0   PARoxetine (PAXIL) 40 MG tablet Take 1 tablet (40 mg total) by mouth daily. 90 tablet 1   silver sulfADIAZINE (SILVADENE) 1 % cream Apply 1 Application topically daily. 400 g 0   Spacer/Aero-Holding Chambers (OPTICHAMBER DIAMOND) MISC Use with inhaler to ensure medication is delivered throughout lungs 1 each 0   torsemide (DEMADEX) 20 MG tablet Take 20 mg by mouth daily.     traMADol (ULTRAM) 50 MG tablet Take 1 tablet (50 mg total) by mouth every 12 (twelve) hours as needed. 60 tablet 3   No current facility-administered medications for this visit.     Musculoskeletal: Strength & Muscle Tone:  N/A Gait & Station:  N/A Patient leans: N/A  Psychiatric Specialty Exam: Review of Systems  Psychiatric/Behavioral:  Positive for dysphoric mood. Negative for agitation, behavioral problems, confusion, decreased concentration, hallucinations, self-injury, sleep disturbance  and suicidal ideas. The patient is nervous/anxious. The patient is not hyperactive.   All other systems reviewed and are negative.   There were no vitals taken for this visit.There is no height or weight on file to calculate BMI.  General Appearance: Fairly Groomed  Eye Contact:  Good  Speech:  Clear and Coherent  Volume:  Normal  Mood:   good  Affect:  Appropriate, Congruent, and calm  Thought Process:  Coherent  Orientation:  Full (Time, Place, and Person)  Thought Content: Logical   Suicidal Thoughts:  No  Homicidal Thoughts:  No  Memory:  Immediate;   Good  Judgement:  Good  Insight:  Good  Psychomotor Activity:  Normal  Concentration:  Concentration: Good and Attention Span: Good  Recall:  Good  Fund of Knowledge: Good  Language: Good  Akathisia:  No  Handed:  Right  AIMS (if indicated): not done  Assets:  Communication Skills Desire for Improvement  ADL's:  Intact  Cognition: WNL  Sleep:  Good   Screenings: PHQ2-9    Flowsheet Row Chronic Care Management from 12/11/2021 in [place] Visit from 12/04/2021 in [place] Visit from 11/03/2021 in [place] Office Visit from 09/02/2021 in [place] Visit from 07/14/2021 in [place]  PHQ-2 Total Score 2 2  0 0 1  PHQ-9 Total Score -- 14 -- 1 12      Flowsheet Row ED to Hosp-Admission (Discharged) from 01/01/2022 in [place] ED from 12/07/2021 in [place] Admission (Discharged) from 08/12/2021 in [place] [facility] PCU  C-SSRS RISK CATEGORY No Risk No Risk No Risk        Assessment and Plan:  SAMAIYA AWADALLAH is a 66 y.o. year old female with a history of depression, type II DM, hypertension, hyperlipidemia, non ischemic cardiomyopathy,  CKD stage IV, cryptogenic stroke, COPD, sleep apnea, back  pain with T12-L1 compression fracture, T7-8 disc herniation with some spinal cord compression with no clinical myelopathy, who presents for follow up appointment for below.   1. MDD (major depressive disorder), recurrent, in partial remission ([place]) 2. PTSD (post-traumatic stress disorder) Although she reports occasional down mood and anxiety in the context of her physical health/admissions, she has been managing things well since the last visit.  Other psychosocial stressors includes unemployment, medical history of chronic pain and stroke.  Will continue Paxil at the current dose to target depression and PTSD. Noted that she has been on this medication for many years without significant side effect.  She will greatly benefit from CBT; will make referral.    # Insomnia Improving. She reports history of snoring, daytime fatigue and insomnia.  Although she was strongly recommended to do a sleep evaluation, she declined this, referring to the evaluation she had few years ago.  Will continue to monitor.      Plan Continue Paxil 40 mg daily Referral to therapy Next appointment- 2/28 at 8:40 for 20 mins, video  -on tramadol. Will monitor for serotonin syndrome   The patient demonstrates the following risk factors for suicide: Chronic risk factors for suicide include: psychiatric disorder of depression and chronic pain. Acute risk factors for suicide include: unemployment. Protective factors for this patient include: positive social support, coping skills, and hope for the future. Considering these factors, the overall suicide risk at this point appears to be low. Patient is appropriate for outpatient follow up.  Collaboration of Care: Collaboration of Care: Other reviewed notes in Epic  Patient/Guardian was advised Release of Information must be obtained prior to any record release in order to collaborate their care with an outside provider. Patient/Guardian was advised if they have not already done so  to contact the registration department to sign all necessary forms in order for [place] to release information regarding their care.   Consent: Patient/Guardian gives verbal consent for treatment and assignment of benefits for services provided during this visit. Patient/Guardian expressed understanding and agreed to proceed.    Norman Clay, MD 01/13/2022, 8:21 AM

## 2022-01-12 ENCOUNTER — Encounter: Payer: Self-pay | Admitting: Family

## 2022-01-12 ENCOUNTER — Ambulatory Visit: Payer: Medicare Other | Attending: Family | Admitting: Family

## 2022-01-12 ENCOUNTER — Inpatient Hospital Stay: Payer: Medicare Other | Admitting: Family Medicine

## 2022-01-12 VITALS — BP 134/55 | HR 69 | Resp 18 | Wt 240.0 lb

## 2022-01-12 DIAGNOSIS — I5022 Chronic systolic (congestive) heart failure: Secondary | ICD-10-CM | POA: Diagnosis not present

## 2022-01-12 DIAGNOSIS — I10 Essential (primary) hypertension: Secondary | ICD-10-CM | POA: Diagnosis not present

## 2022-01-12 DIAGNOSIS — N184 Chronic kidney disease, stage 4 (severe): Secondary | ICD-10-CM

## 2022-01-12 DIAGNOSIS — D631 Anemia in chronic kidney disease: Secondary | ICD-10-CM | POA: Diagnosis not present

## 2022-01-12 DIAGNOSIS — Z90722 Acquired absence of ovaries, bilateral: Secondary | ICD-10-CM | POA: Diagnosis not present

## 2022-01-12 DIAGNOSIS — E1129 Type 2 diabetes mellitus with other diabetic kidney complication: Secondary | ICD-10-CM | POA: Diagnosis not present

## 2022-01-12 DIAGNOSIS — J449 Chronic obstructive pulmonary disease, unspecified: Secondary | ICD-10-CM | POA: Diagnosis not present

## 2022-01-12 DIAGNOSIS — S81802D Unspecified open wound, left lower leg, subsequent encounter: Secondary | ICD-10-CM | POA: Insufficient documentation

## 2022-01-12 DIAGNOSIS — Z8673 Personal history of transient ischemic attack (TIA), and cerebral infarction without residual deficits: Secondary | ICD-10-CM | POA: Insufficient documentation

## 2022-01-12 DIAGNOSIS — F419 Anxiety disorder, unspecified: Secondary | ICD-10-CM | POA: Insufficient documentation

## 2022-01-12 DIAGNOSIS — E1165 Type 2 diabetes mellitus with hyperglycemia: Secondary | ICD-10-CM | POA: Diagnosis not present

## 2022-01-12 DIAGNOSIS — D649 Anemia, unspecified: Secondary | ICD-10-CM | POA: Insufficient documentation

## 2022-01-12 DIAGNOSIS — Z7901 Long term (current) use of anticoagulants: Secondary | ICD-10-CM | POA: Diagnosis not present

## 2022-01-12 DIAGNOSIS — Z6838 Body mass index (BMI) 38.0-38.9, adult: Secondary | ICD-10-CM | POA: Insufficient documentation

## 2022-01-12 DIAGNOSIS — Z7902 Long term (current) use of antithrombotics/antiplatelets: Secondary | ICD-10-CM | POA: Insufficient documentation

## 2022-01-12 DIAGNOSIS — Z7951 Long term (current) use of inhaled steroids: Secondary | ICD-10-CM | POA: Insufficient documentation

## 2022-01-12 DIAGNOSIS — F17210 Nicotine dependence, cigarettes, uncomplicated: Secondary | ICD-10-CM | POA: Diagnosis not present

## 2022-01-12 DIAGNOSIS — R600 Localized edema: Secondary | ICD-10-CM | POA: Insufficient documentation

## 2022-01-12 DIAGNOSIS — I429 Cardiomyopathy, unspecified: Secondary | ICD-10-CM | POA: Diagnosis not present

## 2022-01-12 DIAGNOSIS — D72829 Elevated white blood cell count, unspecified: Secondary | ICD-10-CM | POA: Insufficient documentation

## 2022-01-12 DIAGNOSIS — M549 Dorsalgia, unspecified: Secondary | ICD-10-CM | POA: Insufficient documentation

## 2022-01-12 DIAGNOSIS — Z79899 Other long term (current) drug therapy: Secondary | ICD-10-CM | POA: Diagnosis not present

## 2022-01-12 DIAGNOSIS — I130 Hypertensive heart and chronic kidney disease with heart failure and stage 1 through stage 4 chronic kidney disease, or unspecified chronic kidney disease: Secondary | ICD-10-CM | POA: Insufficient documentation

## 2022-01-12 DIAGNOSIS — Z794 Long term (current) use of insulin: Secondary | ICD-10-CM | POA: Diagnosis not present

## 2022-01-12 DIAGNOSIS — M7989 Other specified soft tissue disorders: Secondary | ICD-10-CM | POA: Diagnosis not present

## 2022-01-12 DIAGNOSIS — F32A Depression, unspecified: Secondary | ICD-10-CM | POA: Insufficient documentation

## 2022-01-12 DIAGNOSIS — N189 Chronic kidney disease, unspecified: Secondary | ICD-10-CM | POA: Insufficient documentation

## 2022-01-12 DIAGNOSIS — I7025 Atherosclerosis of native arteries of other extremities with ulceration: Secondary | ICD-10-CM | POA: Diagnosis not present

## 2022-01-12 DIAGNOSIS — E1122 Type 2 diabetes mellitus with diabetic chronic kidney disease: Secondary | ICD-10-CM | POA: Diagnosis not present

## 2022-01-12 DIAGNOSIS — E669 Obesity, unspecified: Secondary | ICD-10-CM | POA: Insufficient documentation

## 2022-01-12 DIAGNOSIS — S81801D Unspecified open wound, right lower leg, subsequent encounter: Secondary | ICD-10-CM | POA: Insufficient documentation

## 2022-01-12 DIAGNOSIS — G8929 Other chronic pain: Secondary | ICD-10-CM | POA: Diagnosis not present

## 2022-01-12 NOTE — Patient Instructions (Addendum)
Continue weighing daily and call for an overnight weight gain of 3 pounds or more or a weekly weight gain of more than 5 pounds.   If you have voicemail, please make sure your mailbox is cleaned out so that we may leave a message and please make sure to listen to any voicemails.    Get compression socks and put them on every morning with removal at bedtime. Ask home health nurse first about if you can wear them over your wound

## 2022-01-12 NOTE — Progress Notes (Signed)
Patient ID: Jocelyn Wells, female    DOB: 1955/10/28, 66 y.o.   MRN: [id number]  HPI  Ms Gains is a 66 y/o female with a history of DM, HTN, CKD, stroke, anemia, anxiety, COPD, depression, leukocytosis, tobacco use and chronic heart failure.   Echo report from 09/29/21 reviewed and showed an EF of 45-50% along with moderate LVH & mild LAE.   RHC/LHC done 03/25/15 and showed: Severe left ventricular systolic dysfunction with EF 25-30% Elevated pulmonary Order wedge pressure and upper normal to mildly elevated LVEDP  Normal coronary arteries.  Severe systolic left ventricular dysfunction with EF 25-30%. Mildly elevated LVEDP and moderate pulmonary capillary wedge elevation.  Admitted 01/01/22 due to fatigue and dyspnea, also increasing bilateral lower extremity swelling.  On presentation she was requiring 2 L of oxygen to maintain oxygen saturation.  Chest x-ray showed cardiomegaly, mild vascular congestion, bibasilar edema. Needed blood transfusion due to anemia. Initially given IV lasix. Transitioned to oral diuretics. Wound consult done. Discharged after 3 days. Was in the ED 12/07/21 due to abnormal labs from PCP office but LWBS.   She presents today for a follow-up visit with a chief complaint of moderate fatigue with minimal exertion. Describes this as chronic in nature. She has associated blurry vision, shortness of breath, depression, chronic pain and dizziness along with this. She denies any difficulty sleeping, abdominal distention, palpitations, pedal edema, chest pain, wheezing, cough or weight gain.   Weighing daily and she says that she's had a weight loss. She has had some recently falls lately. Denies being dizzy, just falls.   Has a healing wound on her left lower leg and now a wound on her right lower leg. She says that home health nurse comes 3 times/ week to change the bandage.   Unclear of fluid intake and she says that she doesn't know how much she drinks.   Past  Medical History:  Diagnosis Date   Anemia    Anxiety    Cardiomyopathy [facility])    new to her Jan 2017   CHF (congestive heart failure) (HCC)    Chronic kidney disease    COPD (chronic obstructive pulmonary disease) (HCC)    Depression    Diabetes mellitus, type II ([facility])    HTN (hypertension)    Leukocytosis    Obesity    Osteoporosis    PONV (postoperative nausea and vomiting)    Stroke [facility])    Jan 2017   Past Surgical History:  Procedure Laterality Date   ABDOMINAL HYSTERECTOMY     ANKLE FRACTURE SURGERY Left 02/09/2000   BILATERAL SALPINGOOPHORECTOMY  02/08/1998   CARDIAC CATHETERIZATION N/A 03/25/2015   Procedure: Right/Left Heart Cath and Coronary Angiography;  Surgeon: Belva Crome, MD;  Location: [place] [facility];  Service: Cardiovascular;  Laterality: N/A;   CESAREAN SECTION  02/09/1976   COLONOSCOPY WITH PROPOFOL N/A 09/22/2016   Procedure: COLONOSCOPY WITH PROPOFOL;  Surgeon: Manya Silvas, MD;  Location: [facility] ENDOSCOPY;  Service: Endoscopy;  Laterality: N/A;   EP IMPLANTABLE DEVICE N/A 08/05/2015   Procedure: Loop Recorder Insertion;  Surgeon: Deboraha Sprang, MD;  Location: [place] [facility];  Service: Cardiovascular;  Laterality: N/A;   ESOPHAGOGASTRODUODENOSCOPY (EGD) WITH PROPOFOL N/A 09/22/2016   Procedure: ESOPHAGOGASTRODUODENOSCOPY (EGD) WITH PROPOFOL;  Surgeon: Manya Silvas, MD;  Location: [facility] ENDOSCOPY;  Service: Endoscopy;  Laterality: N/A;   ESOPHAGOGASTRODUODENOSCOPY (EGD) WITH PROPOFOL N/A 12/08/2016   Procedure: ESOPHAGOGASTRODUODENOSCOPY (EGD) WITH PROPOFOL;  Surgeon: Manya Silvas, MD;  Location: [facility] ENDOSCOPY;  Service: Endoscopy;  Laterality: N/A;   FRACTURE SURGERY     KNEE ARTHROSCOPY Left 02/09/2003   LOWER EXTREMITY ANGIOGRAPHY Left 08/12/2021   Procedure: Lower Extremity Angiography;  Surgeon: Katha Cabal, MD;  Location: [place] [facility];  Service: Cardiovascular;  Laterality: Left;   TEE WITHOUT CARDIOVERSION N/A  01/31/2015   Procedure: TRANSESOPHAGEAL ECHOCARDIOGRAM (TEE);  Surgeon: Lelon Perla, MD;  Location: [facility] ENDOSCOPY;  Service: Cardiovascular;  Laterality: N/A;   TEMPORARY DIALYSIS CATHETER N/A 08/14/2021   Procedure: TEMPORARY DIALYSIS CATHETER;  Surgeon: Katha Cabal, MD;  Location: [place] [facility];  Service: Cardiovascular;  Laterality: N/A;   TUBAL LIGATION  02/09/1976   ULNAR NERVE TRANSPOSITION  02/08/2006   Family History  Problem Relation Age of Onset   Heart disease Mother        died from CHF   Asthma Mother    Diabetes Mother    Heart disease Father    Aneurysm Father    COPD Brother    Diabetes Brother    Alcohol abuse Paternal Aunt    Cancer Maternal Grandmother        unknownorigin   Anemia Neg Hx    Arrhythmia Neg Hx    Clotting disorder Neg Hx    Fainting Neg Hx    Heart attack Neg Hx    Heart failure Neg Hx    Hyperlipidemia Neg Hx    Hypertension Neg Hx    Social History   Tobacco Use   Smoking status: Every Day    Packs/day: 0.50    Years: 47.00    Total pack years: 23.50    Types: Cigarettes    Start date: 02/08/1974   Smokeless tobacco: Never   Tobacco comments:    1-3 cigarettes a day. 12/09/2021 khj  Substance Use Topics   Alcohol use: No    Alcohol/week: 0.0 standard drinks of alcohol   Allergies  Allergen Reactions   Codeine Nausea And Vomiting   Ivp Dye [Iodinated Contrast Media]     Patients states the IV Dye shuts her kidneys down   Prior to Admission medications   Medication Sig Start Date End Date Taking? Authorizing Provider  albuterol (VENTOLIN HFA) 108 (90 Base) MCG/ACT inhaler TAKE 2 PUFFS BY MOUTH EVERY 6 HOURS AS NEEDED FOR WHEEZE OR SHORTNESS OF BREATH Patient taking differently: Inhale 2 puffs into the lungs every 6 (six) hours as needed for wheezing or shortness of breath. 06/19/21  Yes Gwyneth Sprout, FNP  apixaban (ELIQUIS) 5 MG TABS tablet Take 1 tablet (5 mg total) by mouth 2 (two) times daily. 12/16/20  Yes  Schnier, Dolores Lory, MD  atorvastatin (LIPITOR) 40 MG tablet Take 1 tablet (40 mg total) by mouth daily. 09/08/21  Yes Gwyneth Sprout, FNP  buPROPion (WELLBUTRIN SR) 150 MG 12 hr tablet Take 1 tablet (150 mg total) by mouth 2 (two) times daily. 12/04/21  Yes Gwyneth Sprout, FNP  carvedilol (COREG) 25 MG tablet Take 1 tablet (25 mg total) by mouth 2 (two) times daily with a meal. 01/04/22  Yes Shelly Coss, MD  clopidogrel (PLAVIX) 75 MG tablet Take 1 tablet (75 mg total) by mouth daily. 10/23/21  Yes Hammock, Barbera Setters, NP  Dulaglutide 3 MG/0.5ML SOPN Inject into the skin once a week. 04/17/21  Yes [provider]  gabapentin (NEURONTIN) 300 MG capsule Take 300 mg by mouth 2 (two) times daily.   Yes [provider]  glucose blood  test strip OneTouch Verio strips   Yes [provider]  insulin aspart (NOVOLOG) 100 UNIT/ML injection Insulin pump 05/26/20  Yes [provider]  Insulin Disposable Pump (OMNIPOD DASH PODS, GEN 4,) MISC USE 1 POD EACH EVERY 72    HOURS 10/29/20  Yes [provider]  Insulin Human (INSULIN PUMP) SOLN Per endocrinoloy 06/01/20  Yes Elgergawy, Silver Huguenin, MD  loratadine (CLARITIN) 10 MG tablet Take 1 tablet by mouth daily.   Yes [provider]  losartan (COZAAR) 50 MG tablet Take 1 tablet (50 mg total) by mouth daily. 12/01/21  Yes Agbor-Etang, Aaron Edelman, MD  pantoprazole (PROTONIX) 40 MG tablet Take 1 tablet (40 mg total) by mouth 2 (two) times daily. 08/23/21  Yes Sharen Hones, MD  PARoxetine (PAXIL) 40 MG tablet Take 1 tablet (40 mg total) by mouth daily. 10/12/21 04/10/22 Yes Hisada, Elie Goody, MD  silver sulfADIAZINE (SILVADENE) 1 % cream Apply 1 Application topically daily. 08/28/21  Yes Kris Hartmann, NP  Spacer/Aero-Holding Josiah Lobo (Kirvin) MISC Use with inhaler to ensure medication is delivered throughout lungs 02/13/21  Yes Tally Joe T, FNP  torsemide (DEMADEX) 20 MG tablet Take 20 mg by mouth daily.   Yes [provider]  traMADol (ULTRAM) 50 MG tablet Take 1 tablet (50 mg total) by mouth every 12 (twelve) hours as needed. 11/03/21 03/03/22 Yes Gillis Santa, MD  fluticasone (FLONASE) 50 MCG/ACT nasal spray Place 2 sprays into both nostrils daily. Patient not taking: Reported on 01/12/2022 02/13/21   Gwyneth Sprout, FNP  meclizine (ANTIVERT) 12.5 MG tablet Take 1 tablet (12.5 mg total) by mouth 3 (three) times daily as needed for dizziness. Patient not taking: Reported on 01/12/2022 11/23/21   Myles Gip, DO    Review of Systems  Constitutional:  Positive for fatigue (easily). Negative for appetite change.  HENT:  Negative for congestion, postnasal drip and sore throat.   Eyes:  Positive for visual disturbance (blurry vision).  Respiratory:  Positive for shortness of breath. Negative for cough, chest tightness and wheezing.   Cardiovascular:  Negative for chest pain, palpitations and leg swelling.  Gastrointestinal:  Negative for abdominal distention and abdominal pain.  Endocrine: Negative.   Genitourinary: Negative.   Musculoskeletal:  Positive for arthralgias (knees) and back pain.  Skin:  Positive for wound (right leg).  Allergic/Immunologic: Negative.   Neurological:  Positive for dizziness and light-headedness.  Hematological:  Negative for adenopathy. Does not bruise/bleed easily.  Psychiatric/Behavioral:  Positive for dysphoric mood. Negative for sleep disturbance (sleeping on 1 pillow with oxygen at 2L). The patient is not nervous/anxious.    Vitals:   01/12/22 1438  BP: (!) 134/55  Pulse: 69  Resp: 18  SpO2: 94%  Weight: 240 lb (108.9 kg)   Wt Readings from Last 3 Encounters:  01/12/22 240 lb (108.9 kg)  01/04/22 246 lb 0.5 oz (111.6 kg)  12/22/21 249 lb (112.9 kg)   Lab Results  Component Value Date   CREATININE 2.96 (H) 01/04/2022   CREATININE 2.84 (H) 01/03/2022   CREATININE 2.62 (H) 01/02/2022   Physical Exam Vitals and nursing note reviewed.  Constitutional:       Appearance: She is ill-appearing.  HENT:     Head: Normocephalic and atraumatic.  Eyes:     Comments: Eyelids are puffy  Cardiovascular:     Rate and Rhythm: Normal rate and regular rhythm.  Pulmonary:     Effort: Pulmonary effort is normal. No respiratory distress.  Breath sounds: No wheezing or rales.  Abdominal:     General: There is no distension.     Palpations: Abdomen is soft.     Tenderness: There is no abdominal tenderness.  Musculoskeletal:        General: No tenderness.     Cervical back: Normal range of motion and neck supple.     Right lower leg: Edema (1+ pitting; currently wrapped) present.     Left lower leg: Edema (trace pitting) present.  Skin:    General: Skin is warm and dry.  Neurological:     General: No focal deficit present.     Mental Status: She is alert and oriented to person, place, and time.  Psychiatric:        Mood and Affect: Mood normal.        Behavior: Behavior normal.   Assessment & Plan:  1: Chronic heart failure with mildly reduced ejection fraction- - NYHA class III - euvolemic today - weighing daily; instructed to call for an overnight weight gain of > 2 pounds or a weekly weight gain of > 5 pounds - weight down 8 pounds from last visit 1 month ago - not adding salt to her food - she is unsure of fluid intake and can't even give an estimate; reviewed the importance of knowing how much fluid she drinks and keeping it around 60 ounces - on GDMT of carvedilol & losartan - due to renal function, not a candidate for spironolactone or SGLT2 - will refer to Dr. Haroldine Laws for further evaluation - has had some frequent falls recently and she's unsure of what's causing these - saw cardiology (Hammock) 10/23/21 - could consider compression socks depending on wounds - BNP 01/01/22 was 2508.4 - has received her flu vaccine for this season  2: HTN with CKD- - BP looks good (134/55) - saw PCP (Rumball) 12/22/21 - BMP 01/04/22 reviewed  and showed sodium 141, potassium 5.1, creatinine 2.96 & GFR 17 - saw nephrology Lanora Manis) 12/17/21; returns early Jan 2024  3: COPD- - saw pulmonology Patsey Berthold) 12/09/21 - wears oxygen at 2L at bedtime  4: Anemia- - had video visit with hematology Janese Banks) 12/29/21 - hemoglobin 01/04/22 was 8.6 - has colonoscopy/ EGD scheduled for 02/2022 - received iron infusion yesterday  5: Uncontrolled DM- - saw endocrinology (Solum) 12/24/21 - A1c 12/16/21 was 10.1% - home glucose 152   Patient did not bring her medications nor a list. Each medication was verbally reviewed with the patient and she was encouraged to bring the bottles to every visit to confirm accuracy of list.  Return in 3 weeks, sooner if needed.

## 2022-01-13 ENCOUNTER — Encounter: Payer: Self-pay | Admitting: Psychiatry

## 2022-01-13 ENCOUNTER — Inpatient Hospital Stay: Payer: Medicare Other | Attending: Oncology

## 2022-01-13 ENCOUNTER — Telehealth (INDEPENDENT_AMBULATORY_CARE_PROVIDER_SITE_OTHER): Payer: Medicare Other | Admitting: Psychiatry

## 2022-01-13 VITALS — BP 147/61 | HR 71 | Temp 96.7°F | Resp 18

## 2022-01-13 DIAGNOSIS — I5032 Chronic diastolic (congestive) heart failure: Secondary | ICD-10-CM | POA: Diagnosis not present

## 2022-01-13 DIAGNOSIS — E1151 Type 2 diabetes mellitus with diabetic peripheral angiopathy without gangrene: Secondary | ICD-10-CM | POA: Diagnosis not present

## 2022-01-13 DIAGNOSIS — F4310 Post-traumatic stress disorder, unspecified: Secondary | ICD-10-CM

## 2022-01-13 DIAGNOSIS — N184 Chronic kidney disease, stage 4 (severe): Secondary | ICD-10-CM | POA: Diagnosis not present

## 2022-01-13 DIAGNOSIS — D508 Other iron deficiency anemias: Secondary | ICD-10-CM | POA: Insufficient documentation

## 2022-01-13 DIAGNOSIS — I70248 Atherosclerosis of native arteries of left leg with ulceration of other part of lower left leg: Secondary | ICD-10-CM | POA: Diagnosis not present

## 2022-01-13 DIAGNOSIS — F3341 Major depressive disorder, recurrent, in partial remission: Secondary | ICD-10-CM | POA: Diagnosis not present

## 2022-01-13 DIAGNOSIS — I110 Hypertensive heart disease with heart failure: Secondary | ICD-10-CM | POA: Diagnosis not present

## 2022-01-13 DIAGNOSIS — E1122 Type 2 diabetes mellitus with diabetic chronic kidney disease: Secondary | ICD-10-CM | POA: Diagnosis not present

## 2022-01-13 MED ORDER — SODIUM CHLORIDE 0.9 % IV SOLN
Freq: Once | INTRAVENOUS | Status: AC
  Filled 2022-01-13: qty 250

## 2022-01-13 MED ORDER — SODIUM CHLORIDE 0.9 % IV SOLN
200.0000 mg | INTRAVENOUS | Status: DC
  Administered 2022-01-13: 200 mg via INTRAVENOUS
  Filled 2022-01-13: qty 200

## 2022-01-13 NOTE — Progress Notes (Signed)
MRN : [id number]  Jocelyn Wells is a 66 y.o. (Dec 14, 1955) female who presents with chief complaint of check circulation.  History of Present Illness:   The patient returns to the office for followup and review status post angiogram with intervention on 08/12/2021.    Procedure:  Percutaneous transluminal angioplasty and stent placement left superficial       femoral artery and popliteal to 5 mm proximally and 4 mm distally. 2.    Percutaneous transluminal angioplasty of the left common iliac artery to 9 mm.   3.    Percutaneous transluminal angioplasty of the left peroneal artery to 3 mm.   4.    Mechanical thrombectomy left tibioperoneal trunk and peroneal.    The patient notes she fell a couple weeks ago and now has an ulcer on the right shin/ankle. She reports her left shin ulcer is improving.  No new ulcers or wounds have occurred on the left since the last visit.   Her post angio course was complicated by acute renal failure ans she did require dialysis.  She subsequently has returned to her baseline stage 4 renal dysfunction as noted by her renal function studies dated December 07, 2021.  The patient is seen for evaluation for dialysis access. The patient has chronic renal insufficiency stage IV secondary.  The patient's most recent creatinine clearance is 17 (obtained 01/04/2022). The patient volume status has not yet become an issue. Patient's blood pressures been relatively well controlled. There are minimal uremic symptoms which appear to be relatively well tolerated at this time.  The patient is also followed for DVT.  DVT was identified at [facility] by Duplex ultrasound in June 2020.  The initial symptoms were pain and swelling in the lower extremity.  She was initiated on anticoagulation at that time    No documented history of amaurosis fugax or recent TIA symptoms. There are no recent neurological changes noted. No documented history of DVT, PE or  superficial thrombophlebitis. The patient denies recent episodes of angina or shortness of breath.    ABI's today Rt=0.74  (monophasic) and Lt=1.11 (triphasic)  (previous ABI's Rt=0.79 and Lt=0.96)  Previous duplex US of the upper extremities for vein mapping shows an excellent cephalic vein at the antecubital fossa measuring 5 mm in diameter.   No outpatient medications have been marked as taking for the 01/14/22 encounter (Appointment) with Delana Meyer, Dolores Lory, MD.    Past Medical History:  Diagnosis Date   Anemia    Anxiety    Cardiomyopathy [facility])    new to her Jan 2017   CHF (congestive heart failure) (HCC)    Chronic kidney disease    COPD (chronic obstructive pulmonary disease) ([place])    Depression    Diabetes mellitus, type II ([place])    HTN (hypertension)    Leukocytosis    Obesity    Osteoporosis    PONV (postoperative nausea and vomiting)    Stroke [facility])    Jan 2017    Past Surgical History:  Procedure Laterality Date   ABDOMINAL HYSTERECTOMY     ANKLE FRACTURE SURGERY Left 02/09/2000   BILATERAL SALPINGOOPHORECTOMY  02/08/1998   CARDIAC CATHETERIZATION N/A 03/25/2015   Procedure: Right/Left Heart Cath and Coronary Angiography;  Surgeon: Belva Crome, MD;  Location: [place] [facility];  Service: Cardiovascular;  Laterality: N/A;   CESAREAN SECTION  02/09/1976   COLONOSCOPY WITH PROPOFOL  N/A 09/22/2016   Procedure: COLONOSCOPY WITH PROPOFOL;  Surgeon: Manya Silvas, MD;  Location: [facility] ENDOSCOPY;  Service: Endoscopy;  Laterality: N/A;   EP IMPLANTABLE DEVICE N/A 08/05/2015   Procedure: Loop Recorder Insertion;  Surgeon: Deboraha Sprang, MD;  Location: [place] [facility];  Service: Cardiovascular;  Laterality: N/A;   ESOPHAGOGASTRODUODENOSCOPY (EGD) WITH PROPOFOL N/A 09/22/2016   Procedure: ESOPHAGOGASTRODUODENOSCOPY (EGD) WITH PROPOFOL;  Surgeon: Manya Silvas, MD;  Location: [facility] ENDOSCOPY;  Service: Endoscopy;  Laterality: N/A;   ESOPHAGOGASTRODUODENOSCOPY  (EGD) WITH PROPOFOL N/A 12/08/2016   Procedure: ESOPHAGOGASTRODUODENOSCOPY (EGD) WITH PROPOFOL;  Surgeon: Manya Silvas, MD;  Location: [facility] ENDOSCOPY;  Service: Endoscopy;  Laterality: N/A;   FRACTURE SURGERY     KNEE ARTHROSCOPY Left 02/09/2003   LOWER EXTREMITY ANGIOGRAPHY Left 08/12/2021   Procedure: Lower Extremity Angiography;  Surgeon: Katha Cabal, MD;  Location: [place] [facility];  Service: Cardiovascular;  Laterality: Left;   TEE WITHOUT CARDIOVERSION N/A 01/31/2015   Procedure: TRANSESOPHAGEAL ECHOCARDIOGRAM (TEE);  Surgeon: Lelon Perla, MD;  Location: [facility] ENDOSCOPY;  Service: Cardiovascular;  Laterality: N/A;   TEMPORARY DIALYSIS CATHETER N/A 08/14/2021   Procedure: TEMPORARY DIALYSIS CATHETER;  Surgeon: Katha Cabal, MD;  Location: [place] [facility];  Service: Cardiovascular;  Laterality: N/A;   TUBAL LIGATION  02/09/1976   ULNAR NERVE TRANSPOSITION  02/08/2006    Social History Social History   Tobacco Use   Smoking status: Every Day    Packs/day: 0.50    Years: 47.00    Total pack years: 23.50    Types: Cigarettes    Start date: 02/08/1974   Smokeless tobacco: Never   Tobacco comments:    1-3 cigarettes a day. 12/09/2021 khj  Vaping Use   Vaping Use: Never used  Substance Use Topics   Alcohol use: No    Alcohol/week: 0.0 standard drinks of alcohol   Drug use: No    Family History Family History  Problem Relation Age of Onset   Heart disease Mother        died from CHF   Asthma Mother    Diabetes Mother    Heart disease Father    Aneurysm Father    COPD Brother    Diabetes Brother    Alcohol abuse Paternal Aunt    Cancer Maternal Grandmother        unknownorigin   Anemia Neg Hx    Arrhythmia Neg Hx    Clotting disorder Neg Hx    Fainting Neg Hx    Heart attack Neg Hx    Heart failure Neg Hx    Hyperlipidemia Neg Hx    Hypertension Neg Hx     Allergies  Allergen Reactions   Codeine Nausea And Vomiting   Ivp Dye  [Iodinated Contrast Media]     Patients states the IV Dye shuts her kidneys down     REVIEW OF SYSTEMS (Negative unless checked)  Constitutional: '[]'$ Weight loss  '[]'$ Fever  '[]'$ Chills Cardiac: '[]'$ Chest pain   '[]'$ Chest pressure   '[]'$ Palpitations   '[]'$ Shortness of breath when laying flat   '[]'$ Shortness of breath with exertion. Vascular:  '[x]'$ Pain in legs with walking   '[]'$ Pain in legs at rest  '[]'$ History of DVT   '[]'$ Phlebitis   '[]'$ Swelling in legs   '[]'$ Varicose veins   '[]'$ Non-healing ulcers Pulmonary:   '[]'$ Uses home oxygen   '[]'$ Productive cough   '[]'$ Hemoptysis   '[]'$ Wheeze  '[]'$ COPD   '[]'$ Asthma Neurologic:  '[]'$ Dizziness   '[]'$ Seizures   '[]'$ History  of stroke   '[]'$ History of TIA  '[]'$ Aphasia   '[]'$ Vissual changes   '[]'$ Weakness or numbness in arm   '[]'$ Weakness or numbness in leg Musculoskeletal:   '[]'$ Joint swelling   '[]'$ Joint pain   '[]'$ Low back pain Hematologic:  '[]'$ Easy bruising  '[]'$ Easy bleeding   '[]'$ Hypercoagulable state   '[]'$ Anemic Gastrointestinal:  '[]'$ Diarrhea   '[]'$ Vomiting  '[]'$ Gastroesophageal reflux/heartburn   '[]'$ Difficulty swallowing. Genitourinary:  '[]'$ Chronic kidney disease   '[]'$ Difficult urination  '[]'$ Frequent urination   '[]'$ Blood in urine Skin:  '[]'$ Rashes   '[]'$ Ulcers  Psychological:  '[]'$ History of anxiety   '[]'$  History of major depression.  Physical Examination  There were no vitals filed for this visit. There is no height or weight on file to calculate BMI. Gen: WD/WN, NAD Head: [place]/AT, No temporalis wasting.  Ear/Nose/Throat: Hearing grossly intact, nares w/o erythema or drainage Eyes: PER, EOMI, sclera nonicteric.  Neck: Supple, no masses.  No bruit or JVD.  Pulmonary:  Good air movement, no audible wheezing, no use of accessory muscles.  Cardiac: RRR, normal S1, S2, no Murmurs. Vascular:   open wounds on right  left is essentially healed. Vessel Right Left  Radial Palpable Palpable  PT Not Palpable Trace Palpable  DP Not Palpable Trace Palpable  Gastrointestinal: soft, non-distended. No guarding/no peritoneal signs.   Musculoskeletal: M/S 5/5 throughout.  No visible deformity.  Neurologic: CN 2-12 intact. Pain and light touch intact in extremities.  Symmetrical.  Speech is fluent. Motor exam as listed above. Psychiatric: Judgment intact, Mood & affect appropriate for pt's clinical situation. Dermatologic: No rashes or ulcers noted.  No changes consistent with cellulitis.   CBC Lab Results  Component Value Date   WBC 13.3 (H) 01/04/2022   HGB 8.6 (L) 01/04/2022   HCT 29.7 (L) 01/04/2022   MCV 80.1 01/04/2022   PLT 398 01/04/2022    BMET    Component Value Date/Time   NA 141 01/04/2022 0434   NA 140 12/04/2021 1542   K 5.1 01/04/2022 0434   CL 106 01/04/2022 0434   CO2 27 01/04/2022 0434   GLUCOSE 208 (H) 01/04/2022 0434   BUN 41 (H) 01/04/2022 0434   BUN 38 (H) 12/04/2021 1542   CREATININE 2.96 (H) 01/04/2022 0434   CREATININE 1.37 (H) 03/21/2015 1451   CALCIUM 8.1 (L) 01/04/2022 0434   GFRNONAA 17 (L) 01/04/2022 0434   GFRAA 33 (L) 04/23/2019 1446   Estimated Creatinine Clearance: 23 mL/min (A) (by C-G formula based on SCr of 2.96 mg/dL (H)).  COAG Lab Results  Component Value Date   INR 1.5 (H) 08/14/2021   INR 0.93 10/05/2017   INR 1.05 03/21/2015    Radiology DG Chest 2 View  Result Date: 01/01/2022 CLINICAL DATA:  Shortness of breath, chest pain. EXAM: CHEST - 2 VIEW COMPARISON:  August 14, 2021. FINDINGS: Stable cardiomegaly. Mild central pulmonary vascular congestion is noted. Possible bibasilar pulmonary edema or atelectasis is noted. Bony thorax is unremarkable. IMPRESSION: Stable cardiomegaly with mild central pulmonary vascular congestion and possible bibasilar pulmonary edema or atelectasis. Electronically Signed   By: Marijo Conception M.D.   On: 01/01/2022 15:28   DG Foot Complete Right  Result Date: 12/23/2021 CLINICAL DATA:  Foot pain status post fall. EXAM: RIGHT FOOT COMPLETE - 3+ VIEW COMPARISON:  Right ankle and foot radiographs 07/14/2021 FINDINGS: Moderate to  severe lateral great toe metatarsophalangeal joint space narrowing with mild-to-moderate peripheral lateral degenerative spurring. Tiny plantar calcaneal heel spur. Mild-to-moderate dorsal forefoot soft tissue swelling. No acute fracture  is seen.  No dislocation. IMPRESSION: 1. No acute fracture. 2. Moderate to severe great toe metatarsophalangeal joint osteoarthritis. Electronically Signed   By: Yvonne Kendall M.D.   On: 12/23/2021 15:19   DG Tibia/Fibula Right  Result Date: 12/23/2021 CLINICAL DATA:  Lower leg pain status post fall. EXAM: RIGHT TIBIA AND FIBULA - 2 VIEW COMPARISON:  Right ankle radiographs 07/14/2021, right knee radiographs 11/23/2012 FINDINGS: Moderate medial compartment of the knee joint space narrowing. The ankle mortise is symmetric and intact. Mild superior patellar degenerative osteophytosis. Small knee joint effusion. No acute fracture is seen. No dislocation. IMPRESSION: 1. No acute fracture. 2. Moderate medial compartment of the knee osteoarthritis. Electronically Signed   By: Yvonne Kendall M.D.   On: 12/23/2021 15:17   DG Knee Complete 4 Views Right  Result Date: 12/23/2021 CLINICAL DATA:  Right knee pain status post fall. Fell twice 4 days prior. Medial anterior knee pain, bruising, and swelling. EXAM: RIGHT KNEE - COMPLETE 4+ VIEW COMPARISON:  Right knee radiographs 11/23/2012 FINDINGS: Moderate medial compartment joint space narrowing and mild peripheral osteophytosis. Moderate patellofemoral joint space narrowing and mild peripheral osteophytosis. Small joint effusion, new from prior. New swelling and increased density within the soft tissues anterior to the patella and proximal tibia, likely a posttraumatic soft tissue hematoma measuring up to approximately 13.5 cm in craniocaudal dimension and 2.5 cm in AP dimension. 6 mm age-indeterminate ossicle just anterior to the patella. No acute fracture is seen. No dislocation. IMPRESSION: 1. New swelling and increased density  within the soft tissues anterior to the patella and proximal tibia, likely a posttraumatic soft tissue hematoma. 2. Moderate medial and patellofemoral compartment osteoarthritis. 3. No acute fracture is seen. Electronically Signed   By: Yvonne Kendall M.D.   On: 12/23/2021 15:15     Assessment/Plan 1. Atherosclerosis of native arteries of the extremities with ulceration ([place])  Recommend:  The patient has evidence of severe atherosclerotic changes of both lower extremities associated with ulceration and tissue loss of the right foot.  This represents a limb threatening ischemia and places the patient at the risk for right limb loss.  Patient should undergo angiography of the right lower extremity with the hope for intervention for limb salvage.  The risks and benefits as well as the alternative therapies was discussed in detail with the patient.  All questions were answered.    However, given her renal failure after the last angiogram with a relatively small amount of contrast I have told her I need to discuss this with Dr Candiss Norse.    The patient will follow up with me in the office in 2-3 weeks.  This will give a chance to access for wound improvement (although I think it unlikely)   - VAS [place] ABI WITH/WO TBI  2. CKD (chronic kidney disease), stage IV ([place]) Recommend:  At this time the patient does not have appropriate extremity access for dialysis but previous vein mapping shows a good cephalic vein in the antecubital fossa.  Patient should have a left brachial cephalic fistula created when she is ready.  The risks, benefits and alternative therapies were reviewed in detail with the patient.  All questions were answered.  The patient agrees to proceed with surgery.   The patient will follow up with me in the office.  3. Chronic deep vein thrombosis (DVT) of femoral vein of right lower extremity (HCC) Recommend:   No surgery or intervention at this point in time.  IVC filter is not  indicated at present.  Patient's previous duplex ultrasound of the venous system shows chronic changes in the popliteal and the femoral veins.  The patient continues on anticoagulation   Elevation was stressed, such as the use of a recliner.  I have discussed  DVT and post phlebitic changes such as swelling and why it  causes symptoms such as pain.  The patient should wear graduated compression stockings beginning after three full days of anticoagulation.  The compression should be worn on a daily basis. The patient should wearing the stockings first thing in the morning and removing them in the evening. The patient should not to sleep in the stockings.  In addition, behavioral modification including elevation during the day and avoidance of prolonged dependency will be initiated.    The patient will continue anticoagulation for now as there have not been any problems or complications at this point.   4. Essential hypertension Continue antihypertensive medications as already ordered, these medications have been reviewed and there are no changes at this time.  5. Type 2 diabetes mellitus with other diabetic kidney complication (HCC) Continue hypoglycemic medications as already ordered, these medications have been reviewed and there are no changes at this time.  Hgb A1C to be monitored as already arranged by primary service  6. Chronic obstructive pulmonary disease, unspecified COPD type ([place]) Continue pulmonary medications and aerosols as already ordered, these medications have been reviewed and there are no changes at this time.     [name], MD  01/13/2022 4:50 PM

## 2022-01-14 ENCOUNTER — Ambulatory Visit (INDEPENDENT_AMBULATORY_CARE_PROVIDER_SITE_OTHER): Payer: Medicare Other | Admitting: Vascular Surgery

## 2022-01-14 ENCOUNTER — Ambulatory Visit (INDEPENDENT_AMBULATORY_CARE_PROVIDER_SITE_OTHER): Payer: Medicare Other

## 2022-01-14 ENCOUNTER — Encounter (INDEPENDENT_AMBULATORY_CARE_PROVIDER_SITE_OTHER): Payer: Self-pay | Admitting: Vascular Surgery

## 2022-01-14 ENCOUNTER — Telehealth: Payer: Medicare Other

## 2022-01-14 VITALS — BP 123/66 | HR 70 | Resp 17 | Ht 66.0 in | Wt 241.0 lb

## 2022-01-14 DIAGNOSIS — I7025 Atherosclerosis of native arteries of other extremities with ulceration: Secondary | ICD-10-CM

## 2022-01-14 DIAGNOSIS — N184 Chronic kidney disease, stage 4 (severe): Secondary | ICD-10-CM

## 2022-01-14 DIAGNOSIS — I10 Essential (primary) hypertension: Secondary | ICD-10-CM

## 2022-01-14 DIAGNOSIS — I82511 Chronic embolism and thrombosis of right femoral vein: Secondary | ICD-10-CM

## 2022-01-14 DIAGNOSIS — E1129 Type 2 diabetes mellitus with other diabetic kidney complication: Secondary | ICD-10-CM

## 2022-01-14 DIAGNOSIS — J449 Chronic obstructive pulmonary disease, unspecified: Secondary | ICD-10-CM

## 2022-01-14 MED FILL — Iron Sucrose Inj 20 MG/ML (Fe Equiv): INTRAVENOUS | Qty: 10 | Status: AC

## 2022-01-15 ENCOUNTER — Inpatient Hospital Stay: Payer: Medicare Other

## 2022-01-15 VITALS — BP 179/86 | HR 78 | Temp 97.8°F | Resp 16

## 2022-01-15 DIAGNOSIS — D508 Other iron deficiency anemias: Secondary | ICD-10-CM | POA: Diagnosis not present

## 2022-01-15 DIAGNOSIS — E1122 Type 2 diabetes mellitus with diabetic chronic kidney disease: Secondary | ICD-10-CM | POA: Diagnosis not present

## 2022-01-15 DIAGNOSIS — I70248 Atherosclerosis of native arteries of left leg with ulceration of other part of lower left leg: Secondary | ICD-10-CM | POA: Diagnosis not present

## 2022-01-15 DIAGNOSIS — I5032 Chronic diastolic (congestive) heart failure: Secondary | ICD-10-CM | POA: Diagnosis not present

## 2022-01-15 DIAGNOSIS — E1151 Type 2 diabetes mellitus with diabetic peripheral angiopathy without gangrene: Secondary | ICD-10-CM | POA: Diagnosis not present

## 2022-01-15 DIAGNOSIS — I110 Hypertensive heart disease with heart failure: Secondary | ICD-10-CM | POA: Diagnosis not present

## 2022-01-15 DIAGNOSIS — N184 Chronic kidney disease, stage 4 (severe): Secondary | ICD-10-CM | POA: Diagnosis not present

## 2022-01-15 MED ORDER — SODIUM CHLORIDE 0.9 % IV SOLN
Freq: Once | INTRAVENOUS | Status: AC
  Filled 2022-01-15: qty 250

## 2022-01-15 MED ORDER — SODIUM CHLORIDE 0.9 % IV SOLN
200.0000 mg | INTRAVENOUS | Status: DC
  Administered 2022-01-15: 200 mg via INTRAVENOUS
  Filled 2022-01-15: qty 200

## 2022-01-15 NOTE — Progress Notes (Unsigned)
Virtual telephone visit    Virtual Visit via Telephone Note   This format is felt to be most appropriate for this patient at this time. The patient did not have access to video technology or had technical difficulties with video requiring transitioning to audio format only (telephone). Physical exam was limited to content and character of the telephone converstion.    Patient location: home Provider location: [facility]   I discussed the limitations of evaluation and management by telemedicine and the availability of in person appointments. The patient expressed understanding and agreed to proceed.   Visit Date: 01/18/2022  Today's healthcare provider: Gwyneth Sprout, FNP  Re Introduced to nurse practitioner role and practice setting.  All questions answered.  Discussed provider/patient relationship and expectations.  Subjective    HPI  Depression, Follow-up  She  was last seen for this 2 months ago. Changes made at last visit include Addition of wellbutrin to assist hx of paxil 40    She reports excellent compliance with treatment. She is not having side effects.   She reports excellent tolerance of treatment. Current symptoms include:  none She feels she is Improved since last visit.     01/18/2022    3:11 PM 12/11/2021    5:19 PM 12/04/2021    3:07 PM  Depression screen PHQ 2/9  Decreased Interest 0 1 1  Down, Depressed, Hopeless 0 1 1  PHQ - 2 Score 0 2 2  Altered sleeping 3  3  Tired, decreased energy 3  3  Change in appetite 0  3  Feeling bad or failure about yourself  0  0  Trouble concentrating 0  3  Moving slowly or fidgety/restless 0  0  Suicidal thoughts 0  0  PHQ-9 Score 6  14  Difficult doing work/chores Not difficult at all  Not difficult at all    -----------------------------------------------------------------------------------------     Medications: Outpatient Medications Prior to Visit  Medication Sig   albuterol  (VENTOLIN HFA) 108 (90 Base) MCG/ACT inhaler TAKE 2 PUFFS BY MOUTH EVERY 6 HOURS AS NEEDED FOR WHEEZE OR SHORTNESS OF BREATH (Patient taking differently: Inhale 2 puffs into the lungs every 6 (six) hours as needed for wheezing or shortness of breath.)   apixaban (ELIQUIS) 5 MG TABS tablet Take 1 tablet (5 mg total) by mouth 2 (two) times daily.   atorvastatin (LIPITOR) 40 MG tablet Take 1 tablet (40 mg total) by mouth daily.   carvedilol (COREG) 25 MG tablet Take 1 tablet (25 mg total) by mouth 2 (two) times daily with a meal.   clopidogrel (PLAVIX) 75 MG tablet Take 1 tablet (75 mg total) by mouth daily.   Dulaglutide 3 MG/0.5ML SOPN Inject into the skin once a week.   fluticasone (FLONASE) 50 MCG/ACT nasal spray Place 2 sprays into both nostrils daily.   gabapentin (NEURONTIN) 300 MG capsule Take 300 mg by mouth 2 (two) times daily.   glucose blood test strip OneTouch Verio strips   insulin aspart (NOVOLOG) 100 UNIT/ML injection Insulin pump   Insulin Disposable Pump (OMNIPOD DASH PODS, GEN 4,) MISC USE 1 POD EACH EVERY 72    HOURS   Insulin Human (INSULIN PUMP) SOLN Per endocrinoloy   loratadine (CLARITIN) 10 MG tablet Take 1 tablet by mouth daily.   losartan (COZAAR) 50 MG tablet Take 1 tablet (50 mg total) by mouth daily.   meclizine (ANTIVERT) 12.5 MG tablet Take 1 tablet (12.5 mg total) by mouth  3 (three) times daily as needed for dizziness.   pantoprazole (PROTONIX) 40 MG tablet Take 1 tablet (40 mg total) by mouth 2 (two) times daily.   PARoxetine (PAXIL) 40 MG tablet Take 1 tablet (40 mg total) by mouth daily.   silver sulfADIAZINE (SILVADENE) 1 % cream Apply 1 Application topically daily.   Spacer/Aero-Holding Chambers [facility] DIAMOND) MISC Use with inhaler to ensure medication is delivered throughout lungs   torsemide (DEMADEX) 20 MG tablet Take 20 mg by mouth daily.   traMADol (ULTRAM) 50 MG tablet Take 1 tablet (50 mg total) by mouth every 12 (twelve) hours as needed.    [DISCONTINUED] buPROPion (WELLBUTRIN SR) 150 MG 12 hr tablet Take 1 tablet (150 mg total) by mouth 2 (two) times daily.   No facility-administered medications prior to visit.    Review of Systems  Last CBC Lab Results  Component Value Date   WBC 13.3 (H) 01/04/2022   HGB 8.6 (L) 01/04/2022   HCT 29.7 (L) 01/04/2022   MCV 80.1 01/04/2022   MCH 23.2 (L) 01/04/2022   RDW 20.1 (H) 01/04/2022   PLT 398 40/98/1191   Last metabolic panel Lab Results  Component Value Date   GLUCOSE 208 (H) 01/04/2022   NA 141 01/04/2022   K 5.1 01/04/2022   CL 106 01/04/2022   CO2 27 01/04/2022   BUN 41 (H) 01/04/2022   CREATININE 2.96 (H) 01/04/2022   GFRNONAA 17 (L) 01/04/2022   CALCIUM 8.1 (L) 01/04/2022   PHOS 3.8 08/20/2021   PROT 6.4 (L) 12/15/2021   ALBUMIN 3.1 (L) 12/15/2021   LABGLOB 2.6 12/15/2021   AGRATIO 1.5 12/04/2021   BILITOT 0.4 12/15/2021   ALKPHOS 81 12/15/2021   AST 13 (L) 12/15/2021   ALT 10 12/15/2021   ANIONGAP 8 01/04/2022   Last lipids Lab Results  Component Value Date   CHOL 107 12/04/2021   HDL 36 (L) 12/04/2021   LDLCALC 50 12/04/2021   TRIG 112 12/04/2021   CHOLHDL 3.0 12/04/2021   Last hemoglobin A1c Lab Results  Component Value Date   HGBA1C 10.2 (H) 11/23/2021   Last thyroid functions Lab Results  Component Value Date   TSH 2.977 10/06/2021   T4TOTAL 7.2 10/06/2021   Last vitamin B12 and Folate Lab Results  Component Value Date   VITAMINB12 310 12/15/2021   FOLATE 7.1 12/15/2021    Objective    BP 129/80   Wt 239 lb (108.4 kg)   BMI 38.58 kg/m   BP Readings from Last 3 Encounters:  01/18/22 129/80  01/15/22 (!) 179/86  01/14/22 123/66   Wt Readings from Last 3 Encounters:  01/18/22 239 lb (108.4 kg)  01/14/22 241 lb (109.3 kg)  01/12/22 240 lb (108.9 kg)   SpO2 Readings from Last 3 Encounters:  01/15/22 94%  01/13/22 98%  01/12/22 94%    Assessment & Plan     Problem List Items Addressed This Visit        Cardiovascular and Mediastinum   Atherosclerosis of native arteries of the extremities with ulceration ([place])    Acute on chronic, has recently injured other leg and may need stenting  Patient remains not interested given previous complications in light of CKD Conversation with Schnier via secure chat to inform him of possible GOC planning         Respiratory   COPD (chronic obstructive pulmonary disease) ([place])    Presumably worse given pt report and need for continuous O2; pt does not sound SOB  and does not have a way to check. O2 was checked this am with HHA Advised I would reach out to Dr Patsey Berthold at [facility] to have pt seen for f/u Pt reports that she has quit tobacco again within last month         Other   Depression, recurrent ([place]) - Primary    Chronic, improved per pt report  Denies SI or HI Contracted for safety Refills sent       Relevant Medications   buPROPion (WELLBUTRIN SR) 150 MG 12 hr tablet   Frequent falls    Recent fall with injury HHA is assisting with care and wrapping Has f/u appt in 3 weeks with vascular        Return in about 3 months (around 04/19/2022), or if symptoms worsen or fail to improve, for annual examination.    I discussed the assessment and treatment plan with the patient. The patient was provided an opportunity to ask questions and all were answered. The patient agreed with the plan and demonstrated an understanding of the instructions.   The patient was advised to call back or seek an in-person evaluation if the symptoms worsen or if the condition fails to improve as anticipated.  I provided 15 minutes of non-face-to-face time during this encounter discussing acute and chronic complaints and upcoming follow ups needed with cards, pulm, and vascular.  Vonna Kotyk, FNP, have reviewed all documentation for this visit. The documentation on 01/18/22 for the exam, diagnosis, procedures, and orders are all accurate and complete.  Gwyneth Sprout, [place] [phone] (phone) [phone] (fax)  [place]

## 2022-01-15 NOTE — Patient Instructions (Signed)

## 2022-01-17 ENCOUNTER — Encounter (INDEPENDENT_AMBULATORY_CARE_PROVIDER_SITE_OTHER): Payer: Self-pay | Admitting: Vascular Surgery

## 2022-01-18 ENCOUNTER — Encounter: Payer: Self-pay | Admitting: Family Medicine

## 2022-01-18 ENCOUNTER — Telehealth: Payer: Medicare Other

## 2022-01-18 ENCOUNTER — Ambulatory Visit (INDEPENDENT_AMBULATORY_CARE_PROVIDER_SITE_OTHER): Payer: Medicare Other | Admitting: Family Medicine

## 2022-01-18 VITALS — BP 129/80 | Wt 239.0 lb

## 2022-01-18 DIAGNOSIS — E1122 Type 2 diabetes mellitus with diabetic chronic kidney disease: Secondary | ICD-10-CM | POA: Diagnosis not present

## 2022-01-18 DIAGNOSIS — J449 Chronic obstructive pulmonary disease, unspecified: Secondary | ICD-10-CM

## 2022-01-18 DIAGNOSIS — F339 Major depressive disorder, recurrent, unspecified: Secondary | ICD-10-CM

## 2022-01-18 DIAGNOSIS — I110 Hypertensive heart disease with heart failure: Secondary | ICD-10-CM | POA: Diagnosis not present

## 2022-01-18 DIAGNOSIS — R296 Repeated falls: Secondary | ICD-10-CM | POA: Diagnosis not present

## 2022-01-18 DIAGNOSIS — I7025 Atherosclerosis of native arteries of other extremities with ulceration: Secondary | ICD-10-CM | POA: Diagnosis not present

## 2022-01-18 DIAGNOSIS — N184 Chronic kidney disease, stage 4 (severe): Secondary | ICD-10-CM | POA: Diagnosis not present

## 2022-01-18 DIAGNOSIS — E1151 Type 2 diabetes mellitus with diabetic peripheral angiopathy without gangrene: Secondary | ICD-10-CM | POA: Diagnosis not present

## 2022-01-18 DIAGNOSIS — I70248 Atherosclerosis of native arteries of left leg with ulceration of other part of lower left leg: Secondary | ICD-10-CM | POA: Diagnosis not present

## 2022-01-18 DIAGNOSIS — I5032 Chronic diastolic (congestive) heart failure: Secondary | ICD-10-CM | POA: Diagnosis not present

## 2022-01-18 MED ORDER — BUPROPION HCL ER (SR) 150 MG PO TB12: 150.0000 mg | ORAL_TABLET | Freq: Two times a day (BID) | ORAL | 1 refills | Status: DC

## 2022-01-18 NOTE — Assessment & Plan Note (Signed)
Presumably worse given Jocelyn Wells report and need for continuous O2; Jocelyn Wells does not sound SOB and does not have a way to check. O2 was checked this am with HHA Advised I would reach out to Dr Patsey Berthold at [facility] to have Jocelyn Wells seen for f/u Jocelyn Wells reports that she has quit tobacco again within last month

## 2022-01-18 NOTE — Assessment & Plan Note (Signed)
Recent fall with injury HHA is assisting with care and wrapping Has f/u appt in 3 weeks with vascular

## 2022-01-18 NOTE — Assessment & Plan Note (Signed)
Acute on chronic, has recently injured other leg and may need stenting  Patient remains not interested given previous complications in light of CKD Conversation with Schnier via secure chat to inform him of possible GOC planning

## 2022-01-18 NOTE — Assessment & Plan Note (Signed)
Chronic, improved per pt report  Denies SI or HI Contracted for safety Refills sent

## 2022-01-19 ENCOUNTER — Ambulatory Visit (INDEPENDENT_AMBULATORY_CARE_PROVIDER_SITE_OTHER): Payer: Medicare Other | Admitting: Podiatry

## 2022-01-19 DIAGNOSIS — B351 Tinea unguium: Secondary | ICD-10-CM | POA: Diagnosis not present

## 2022-01-19 DIAGNOSIS — M79675 Pain in left toe(s): Secondary | ICD-10-CM | POA: Diagnosis not present

## 2022-01-19 DIAGNOSIS — E119 Type 2 diabetes mellitus without complications: Secondary | ICD-10-CM | POA: Diagnosis not present

## 2022-01-19 DIAGNOSIS — M79674 Pain in right toe(s): Secondary | ICD-10-CM | POA: Diagnosis not present

## 2022-01-19 NOTE — Progress Notes (Signed)
   Chief Complaint  Patient presents with   foot care    Patient is here for diabetic foot care.    SUBJECTIVE Patient with a history of diabetes mellitus presents to office today complaining of elongated, thickened nails that cause pain while ambulating in shoes.  Patient is unable to trim their own nails. Patient is here for further evaluation and treatment.  Past Medical History:  Diagnosis Date   Anemia    Anxiety    Cardiomyopathy ([place])    new to her Jan 2017   CHF (congestive heart failure) (HCC)    Chronic kidney disease    COPD (chronic obstructive pulmonary disease) (HCC)    Depression    Diabetes mellitus, type II ([place])    HTN (hypertension)    Leukocytosis    Obesity    Osteoporosis    PONV (postoperative nausea and vomiting)    Stroke [facility])    Jan 2017    Allergies  Allergen Reactions   Codeine Nausea And Vomiting   Ivp Dye [Iodinated Contrast Media]     Patients states the IV Dye shuts her kidneys down     OBJECTIVE General Patient is awake, alert, and oriented x 3 and in no acute distress. Derm Skin is dry and supple bilateral. Negative open lesions or macerations. Remaining integument unremarkable. Nails are tender, long, thickened and dystrophic with subungual debris, consistent with onychomycosis, 1-5 bilateral. No signs of infection noted. Vasc  DP and PT pedal pulses palpable bilaterally. Temperature gradient within normal limits.  Neuro Epicritic and protective threshold sensation diminished bilaterally.  Musculoskeletal Exam No symptomatic pedal deformities noted bilateral. Muscular strength within normal limits.  ASSESSMENT 1. Diabetes Mellitus w/ peripheral neuropathy 2.  Pain due to onychomycosis of toenails bilateral  PLAN OF CARE 1. Patient evaluated today.  Comprehensive diabetic foot exam performed today 2. Instructed to maintain good pedal hygiene and foot care. Stressed importance of controlling blood sugar.  3. Mechanical debridement  of nails 1-5 bilaterally performed using a nail nipper. Filed with dremel without incident.  4. Return to clinic in 3 mos.     Edrick Kins, DPM [facility]  Dr. Edrick Kins, DPM    [address],  [address]                Office [phone]  Fax [phone]

## 2022-01-20 ENCOUNTER — Other Ambulatory Visit: Payer: Self-pay

## 2022-01-20 DIAGNOSIS — E1122 Type 2 diabetes mellitus with diabetic chronic kidney disease: Secondary | ICD-10-CM | POA: Diagnosis not present

## 2022-01-20 DIAGNOSIS — I70248 Atherosclerosis of native arteries of left leg with ulceration of other part of lower left leg: Secondary | ICD-10-CM | POA: Diagnosis not present

## 2022-01-20 DIAGNOSIS — I5032 Chronic diastolic (congestive) heart failure: Secondary | ICD-10-CM | POA: Diagnosis not present

## 2022-01-20 DIAGNOSIS — I110 Hypertensive heart disease with heart failure: Secondary | ICD-10-CM | POA: Diagnosis not present

## 2022-01-20 DIAGNOSIS — D500 Iron deficiency anemia secondary to blood loss (chronic): Secondary | ICD-10-CM

## 2022-01-20 DIAGNOSIS — N184 Chronic kidney disease, stage 4 (severe): Secondary | ICD-10-CM | POA: Diagnosis not present

## 2022-01-20 DIAGNOSIS — E1151 Type 2 diabetes mellitus with diabetic peripheral angiopathy without gangrene: Secondary | ICD-10-CM | POA: Diagnosis not present

## 2022-01-21 ENCOUNTER — Inpatient Hospital Stay: Payer: Medicare Other

## 2022-01-21 VITALS — BP 143/65 | HR 69

## 2022-01-21 DIAGNOSIS — I5033 Acute on chronic diastolic (congestive) heart failure: Secondary | ICD-10-CM | POA: Diagnosis not present

## 2022-01-21 DIAGNOSIS — M1711 Unilateral primary osteoarthritis, right knee: Secondary | ICD-10-CM | POA: Diagnosis not present

## 2022-01-21 DIAGNOSIS — J9621 Acute and chronic respiratory failure with hypoxia: Secondary | ICD-10-CM | POA: Diagnosis not present

## 2022-01-21 DIAGNOSIS — E1140 Type 2 diabetes mellitus with diabetic neuropathy, unspecified: Secondary | ICD-10-CM | POA: Diagnosis not present

## 2022-01-21 DIAGNOSIS — E1165 Type 2 diabetes mellitus with hyperglycemia: Secondary | ICD-10-CM | POA: Diagnosis not present

## 2022-01-21 DIAGNOSIS — I480 Paroxysmal atrial fibrillation: Secondary | ICD-10-CM | POA: Diagnosis not present

## 2022-01-21 DIAGNOSIS — I452 Bifascicular block: Secondary | ICD-10-CM | POA: Diagnosis not present

## 2022-01-21 DIAGNOSIS — E1159 Type 2 diabetes mellitus with other circulatory complications: Secondary | ICD-10-CM | POA: Diagnosis not present

## 2022-01-21 DIAGNOSIS — D508 Other iron deficiency anemias: Secondary | ICD-10-CM | POA: Diagnosis not present

## 2022-01-21 DIAGNOSIS — D631 Anemia in chronic kidney disease: Secondary | ICD-10-CM | POA: Diagnosis not present

## 2022-01-21 DIAGNOSIS — N184 Chronic kidney disease, stage 4 (severe): Secondary | ICD-10-CM | POA: Diagnosis not present

## 2022-01-21 DIAGNOSIS — E1151 Type 2 diabetes mellitus with diabetic peripheral angiopathy without gangrene: Secondary | ICD-10-CM | POA: Diagnosis not present

## 2022-01-21 DIAGNOSIS — I70248 Atherosclerosis of native arteries of left leg with ulceration of other part of lower left leg: Secondary | ICD-10-CM | POA: Diagnosis not present

## 2022-01-21 DIAGNOSIS — F330 Major depressive disorder, recurrent, mild: Secondary | ICD-10-CM | POA: Diagnosis not present

## 2022-01-21 DIAGNOSIS — E1122 Type 2 diabetes mellitus with diabetic chronic kidney disease: Secondary | ICD-10-CM | POA: Diagnosis not present

## 2022-01-21 DIAGNOSIS — F419 Anxiety disorder, unspecified: Secondary | ICD-10-CM | POA: Diagnosis not present

## 2022-01-21 DIAGNOSIS — D509 Iron deficiency anemia, unspecified: Secondary | ICD-10-CM | POA: Diagnosis not present

## 2022-01-21 DIAGNOSIS — I152 Hypertension secondary to endocrine disorders: Secondary | ICD-10-CM | POA: Diagnosis not present

## 2022-01-21 DIAGNOSIS — M810 Age-related osteoporosis without current pathological fracture: Secondary | ICD-10-CM | POA: Diagnosis not present

## 2022-01-21 DIAGNOSIS — D72829 Elevated white blood cell count, unspecified: Secondary | ICD-10-CM | POA: Diagnosis not present

## 2022-01-21 DIAGNOSIS — J449 Chronic obstructive pulmonary disease, unspecified: Secondary | ICD-10-CM | POA: Diagnosis not present

## 2022-01-21 DIAGNOSIS — M48061 Spinal stenosis, lumbar region without neurogenic claudication: Secondary | ICD-10-CM | POA: Diagnosis not present

## 2022-01-21 DIAGNOSIS — L97828 Non-pressure chronic ulcer of other part of left lower leg with other specified severity: Secondary | ICD-10-CM | POA: Diagnosis not present

## 2022-01-21 DIAGNOSIS — I70201 Unspecified atherosclerosis of native arteries of extremities, right leg: Secondary | ICD-10-CM | POA: Diagnosis not present

## 2022-01-21 DIAGNOSIS — D500 Iron deficiency anemia secondary to blood loss (chronic): Secondary | ICD-10-CM

## 2022-01-21 DIAGNOSIS — S81801D Unspecified open wound, right lower leg, subsequent encounter: Secondary | ICD-10-CM | POA: Diagnosis not present

## 2022-01-21 DIAGNOSIS — I429 Cardiomyopathy, unspecified: Secondary | ICD-10-CM | POA: Diagnosis not present

## 2022-01-21 LAB — CBC WITH DIFFERENTIAL/PLATELET
Abs Immature Granulocytes: 0.05 10*3/uL (ref 0.00–0.07)
Basophils Absolute: 0.1 10*3/uL (ref 0.0–0.1)
Basophils Relative: 1 %
Eosinophils Absolute: 0.4 10*3/uL (ref 0.0–0.5)
Eosinophils Relative: 3 %
HCT: 33.1 % — ABNORMAL LOW (ref 36.0–46.0)
Hemoglobin: 9.1 g/dL — ABNORMAL LOW (ref 12.0–15.0)
Immature Granulocytes: 0 %
Lymphocytes Relative: 7 %
Lymphs Abs: 0.9 10*3/uL (ref 0.7–4.0)
MCH: 23.5 pg — ABNORMAL LOW (ref 26.0–34.0)
MCHC: 27.5 g/dL — ABNORMAL LOW (ref 30.0–36.0)
MCV: 85.5 fL (ref 80.0–100.0)
Monocytes Absolute: 1 10*3/uL (ref 0.1–1.0)
Monocytes Relative: 8 %
Neutro Abs: 10.7 10*3/uL — ABNORMAL HIGH (ref 1.7–7.7)
Neutrophils Relative %: 81 %
Platelets: 570 10*3/uL — ABNORMAL HIGH (ref 150–400)
RBC: 3.87 MIL/uL (ref 3.87–5.11)
RDW: 22.1 % — ABNORMAL HIGH (ref 11.5–15.5)
WBC: 13.2 10*3/uL — ABNORMAL HIGH (ref 4.0–10.5)
nRBC: 0 % (ref 0.0–0.2)

## 2022-01-21 LAB — IRON AND TIBC
Iron: 41 ug/dL (ref 28–170)
Saturation Ratios: 12 % (ref 10.4–31.8)
TIBC: 343 ug/dL (ref 250–450)
UIBC: 302 ug/dL

## 2022-01-21 LAB — FERRITIN: Ferritin: 67 ng/mL (ref 11–307)

## 2022-01-21 MED ORDER — SODIUM CHLORIDE 0.9 % IV SOLN
Freq: Once | INTRAVENOUS | Status: AC
  Filled 2022-01-21: qty 250

## 2022-01-21 MED ORDER — SODIUM CHLORIDE 0.9 % IV SOLN
200.0000 mg | INTRAVENOUS | Status: DC
  Administered 2022-01-21: 200 mg via INTRAVENOUS
  Filled 2022-01-21: qty 200

## 2022-01-21 NOTE — Progress Notes (Deleted)
Cardiology Clinic Note   Patient Name: Jocelyn Wells Date of Encounter: 01/21/2022  Primary Care Provider:  Gwyneth Sprout, FNP Primary Cardiologist:  None  Patient Profile    66 year old female with a history of anxiety, congestive heart failure, paroxysmal atrial fibrillation, hypertension, diabetes, former smoker, COPD, CVA, DVT, CKD, lumbar spinal stenosis causing severe back pain, who is here today to follow-up.  Past Medical History    Past Medical History:  Diagnosis Date   Anemia    Anxiety    Cardiomyopathy ([place])    new to her Jan 2017   CHF (congestive heart failure) (HCC)    Chronic kidney disease    COPD (chronic obstructive pulmonary disease) (HCC)    Depression    Diabetes mellitus, type II ([place])    HTN (hypertension)    Leukocytosis    Obesity    Osteoporosis    PONV (postoperative nausea and vomiting)    Stroke [facility])    Jan 2017   Past Surgical History:  Procedure Laterality Date   ABDOMINAL HYSTERECTOMY     ANKLE FRACTURE SURGERY Left 02/09/2000   BILATERAL SALPINGOOPHORECTOMY  02/08/1998   CARDIAC CATHETERIZATION N/A 03/25/2015   Procedure: Right/Left Heart Cath and Coronary Angiography;  Surgeon: Belva Crome, MD;  Location: [place] CV LAB;  Service: Cardiovascular;  Laterality: N/A;   CESAREAN SECTION  02/09/1976   COLONOSCOPY WITH PROPOFOL N/A 09/22/2016   Procedure: COLONOSCOPY WITH PROPOFOL;  Surgeon: Manya Silvas, MD;  Location: [facility] ENDOSCOPY;  Service: Endoscopy;  Laterality: N/A;   EP IMPLANTABLE DEVICE N/A 08/05/2015   Procedure: Loop Recorder Insertion;  Surgeon: Deboraha Sprang, MD;  Location: [place] CV LAB;  Service: Cardiovascular;  Laterality: N/A;   ESOPHAGOGASTRODUODENOSCOPY (EGD) WITH PROPOFOL N/A 09/22/2016   Procedure: ESOPHAGOGASTRODUODENOSCOPY (EGD) WITH PROPOFOL;  Surgeon: Manya Silvas, MD;  Location[facility] ENDOSCOPY;  Service: Endoscopy;  Laterality: N/A;   ESOPHAGOGASTRODUODENOSCOPY (EGD) WITH  PROPOFOL N/A 12/08/2016   Procedure: ESOPHAGOGASTRODUODENOSCOPY (EGD) WITH PROPOFOL;  Surgeon: Manya Silvas, MD;  Location: [facility] ENDOSCOPY;  Service: Endoscopy;  Laterality: N/A;   FRACTURE SURGERY     KNEE ARTHROSCOPY Left 02/09/2003   LOWER EXTREMITY ANGIOGRAPHY Left 08/12/2021   Procedure: Lower Extremity Angiography;  Surgeon: Katha Cabal, MD;  Location: [place] CV LAB;  Service: Cardiovascular;  Laterality: Left;   TEE WITHOUT CARDIOVERSION N/A 01/31/2015   Procedure: TRANSESOPHAGEAL ECHOCARDIOGRAM (TEE);  Surgeon: Lelon Perla, MD;  Location: [facility] ENDOSCOPY;  Service: Cardiovascular;  Laterality: N/A;   TEMPORARY DIALYSIS CATHETER N/A 08/14/2021   Procedure: TEMPORARY DIALYSIS CATHETER;  Surgeon: Katha Cabal, MD;  Location: [place] CV LAB;  Service: Cardiovascular;  Laterality: N/A;   TUBAL LIGATION  02/09/1976   ULNAR NERVE TRANSPOSITION  02/08/2006    Allergies  Allergies  Allergen Reactions   Codeine Nausea And Vomiting   Ivp Dye [Iodinated Contrast Media]     Patients states the IV Dye shuts her kidneys down    History of Present Illness    Jocelyn Wells is a 66 year old female with a history of anxiety,  congestive heart failure with improved EF 45% in 2017 that normalized to 60-65% 2019, paroxysmal atrial fibrillation on chronic apixaban, hypertension, diabetes, former smoker x 40+ years, COPD, CVA due to thrombosis of the left middle cerebral artery status post ILR in 2017, DVT with continued apixaban, CKD, and lumbar spinal stenosis with back pain.  Loop recorder placed after CVA.  ILR check  with no evidence of atrial fibrillation.  Left heart catheterization in 2017 revealed normal coronary arteries.  Echocardiogram in 2017 revealed LVEF of 40-45%, repeat echocardiogram in 2019 revealed EF of 60-65%.  Last echocardiogram revealed LVEF of 45-50%, left ventricle is mildly decreased function, no regional wall motion abnormalities, moderate  left ventricular hypertrophy, G1 DD, aortic valve sclerosis without stenosis.  7/523 she underwent percutaneous transluminal angioplasty and stent placement to the left superficial femoral artery and popliteal to 5 mm approximately 4 mm distally.  Percutaneous transluminal angioplasty of the left common iliac artery, percutaneous transluminal angioplasty of the left peroneal artery and mechanical thrombectomy of the left tibioperoneal trunk and peroneal with*closure of the right common femoral artery anatomy.  Postoperative period was complicated due to worsening renal function which required initiation of dialysis.  Temporary dialysis catheter was placed in the right femoral.  She was able to revert back to stage IV chronic kidney disease with outpatient follow-up with nephrology to determine if she would like to pursue hemodialysis versus peritoneal dialysis she was then subsequently discharged from the facility 08/23/2021.  Was last seen in clinic 10/23/2021 stating that she was doing okay she just returned from a trip.  Home health nurse continues to come to the home and dressed the wounds several times a week and they were concerned that she may have to start going into the wound clinic even after her arterial procedures by vascular the wounds were still not healing.  There was a discussion that if her breathing continue to worsen then she may benefit from a right heart catheterization.  There were no other changes made to her medications or tests ordered.  She was admitted to the hospital 01/01/2022 for shortness of breath.  And home health nurse felt that she did not look well today and instructed her to come in for evaluation.  Initial vitals reveal blood pressure 164/86, pulse of 80, respirations of 18, temperature 98.1.  Pertinent labs were chloride 114, BUN 29, serum creatinine 2.63, with estimated GFR 19, WBCs of 12.9, hemoglobin of 6.7, hematocrit 24.1, BNP of 2508.4, and high-sensitivity troponins  were flat at 18 x 2.  She was admitted for symptomatic anemia transfused with recommendation to keep hemoglobin in range of 8.  She was also diuresed with Lasix.  She was followed by nephrology following her AKI was thought to be near euvolemic status and nephrology cleared her for discharge from the nephrology perspective.  She was also advised that she would need to follow-up with hematology as outpatient as well for her anemia likely a combination of her CKD and iron deficiency anemia.  She was discharged on 01/04/2022.  She does have outpatient colonoscopy and EGD scheduled for 02/2022 she is also continued on iron infusions.  She returns to clinic today Home Medications    Current Outpatient Medications  Medication Sig Dispense Refill   albuterol (VENTOLIN HFA) 108 (90 Base) MCG/ACT inhaler TAKE 2 PUFFS BY MOUTH EVERY 6 HOURS AS NEEDED FOR WHEEZE OR SHORTNESS OF BREATH (Patient taking differently: Inhale 2 puffs into the lungs every 6 (six) hours as needed for wheezing or shortness of breath.) 8.5 each 2   apixaban (ELIQUIS) 5 MG TABS tablet Take 1 tablet (5 mg total) by mouth 2 (two) times daily. 180 tablet 3   atorvastatin (LIPITOR) 40 MG tablet Take 1 tablet (40 mg total) by mouth daily. 90 tablet 3   buPROPion (WELLBUTRIN SR) 150 MG 12 hr tablet Take 1 tablet (150 mg  total) by mouth 2 (two) times daily. 180 tablet 1   carvedilol (COREG) 25 MG tablet Take 1 tablet (25 mg total) by mouth 2 (two) times daily with a meal. 60 tablet 0   clopidogrel (PLAVIX) 75 MG tablet Take 1 tablet (75 mg total) by mouth daily. 90 tablet 3   Dulaglutide 3 MG/0.5ML SOPN Inject into the skin once a week.     fluticasone (FLONASE) 50 MCG/ACT nasal spray Place 2 sprays into both nostrils daily. 16 g 6   gabapentin (NEURONTIN) 300 MG capsule Take 300 mg by mouth 2 (two) times daily.     glucose blood test strip OneTouch Verio strips     insulin aspart (NOVOLOG) 100 UNIT/ML injection Insulin pump     Insulin  Disposable Pump (OMNIPOD DASH PODS, GEN 4,) MISC USE 1 POD EACH EVERY 72    HOURS     Insulin Human (INSULIN PUMP) SOLN Per endocrinoloy     loratadine (CLARITIN) 10 MG tablet Take 1 tablet by mouth daily.     losartan (COZAAR) 50 MG tablet Take 1 tablet (50 mg total) by mouth daily. 90 tablet 0   meclizine (ANTIVERT) 12.5 MG tablet Take 1 tablet (12.5 mg total) by mouth 3 (three) times daily as needed for dizziness. 30 tablet 0   pantoprazole (PROTONIX) 40 MG tablet Take 1 tablet (40 mg total) by mouth 2 (two) times daily. 60 tablet 0   PARoxetine (PAXIL) 40 MG tablet Take 1 tablet (40 mg total) by mouth daily. 90 tablet 1   silver sulfADIAZINE (SILVADENE) 1 % cream Apply 1 Application topically daily. 400 g 0   Spacer/Aero-Holding Chambers (OPTICHAMBER DIAMOND) MISC Use with inhaler to ensure medication is delivered throughout lungs 1 each 0   torsemide (DEMADEX) 20 MG tablet Take 20 mg by mouth daily.     traMADol (ULTRAM) 50 MG tablet Take 1 tablet (50 mg total) by mouth every 12 (twelve) hours as needed. 60 tablet 3   No current facility-administered medications for this visit.     Family History    Family History  Problem Relation Age of Onset   Heart disease Mother        died from CHF   Asthma Mother    Diabetes Mother    Heart disease Father    Aneurysm Father    COPD Brother    Diabetes Brother    Alcohol abuse Paternal Aunt    Cancer Maternal Grandmother        unknownorigin   Anemia Neg Hx    Arrhythmia Neg Hx    Clotting disorder Neg Hx    Fainting Neg Hx    Heart attack Neg Hx    Heart failure Neg Hx    Hyperlipidemia Neg Hx    Hypertension Neg Hx    She indicated that her mother is deceased. She indicated that her father is deceased. She indicated that both of her brothers are deceased. She indicated that the status of her maternal grandmother is unknown. She indicated that her maternal grandfather is deceased. She indicated that the status of her paternal aunt  is unknown. She indicated that the status of her neg hx is unknown.  Social History    Social History   Socioeconomic History   Marital status: Married    Spouse name: Not on file   Number of children: 2   Years of education: Not on file   Highest education level: Bachelor's degree (e.g., BA, AB, BS)  Occupational History   Occupation: disabled    Comment: retired  Tobacco Use   Smoking status: Every Day    Packs/day: 0.50    Years: 47.00    Total pack years: 23.50    Types: Cigarettes    Start date: 02/08/1974   Smokeless tobacco: Never   Tobacco comments:    1-3 cigarettes a day. 12/09/2021 khj; reported to have quit 01/18/2022  Vaping Use   Vaping Use: Never used  Substance and Sexual Activity   Alcohol use: No    Alcohol/week: 0.0 standard drinks of alcohol   Drug use: No   Sexual activity: Yes    Birth control/protection: None  Other Topics Concern   Not on file  Social History Narrative   Lives at home with stepson and husband, dogs and cats   Caffeine  Drinks sweet tea.   Right handed.    Social Determinants of Health   Financial Resource Strain: Low Risk  (12/11/2021)   Overall Financial Resource Strain (CARDIA)    Difficulty of Paying Living Expenses: Not very hard  Food Insecurity: No Food Insecurity (01/05/2022)   Hunger Vital Sign    Worried About Running Out of Food in the Last Year: Never true    Ran Out of Food in the Last Year: Never true  Transportation Needs: No Transportation Needs (01/05/2022)   PRAPARE - Hydrologist (Medical): No    Lack of Transportation (Non-Medical): No  Physical Activity: Inactive (12/11/2021)   Exercise Vital Sign    Days of Exercise per Week: 0 days    Minutes of Exercise per Session: 0 min  Stress: Stress Concern Present (12/11/2021)   [place]    Feeling of Stress : To some extent  Social Connections: Moderately Isolated  (12/11/2021)   Social Connection and Isolation Panel [NHANES]    Frequency of Communication with Friends and Family: More than three times a week    Frequency of Social Gatherings with Friends and Family: Once a week    Attends Religious Services: Never    Marine scientist or Organizations: No    Attends Archivist Meetings: Never    Marital Status: Married  Human resources officer Violence: Not At Risk (01/02/2022)   Humiliation, Afraid, Rape, and Kick questionnaire    Fear of Current or Ex-Partner: No    Emotionally Abused: No    Physically Abused: No    Sexually Abused: No     Review of Systems    General:  No chills, fever, night sweats or weight changes.  Cardiovascular:  No chest pain, dyspnea on exertion, edema, orthopnea, palpitations, paroxysmal nocturnal dyspnea. Dermatological: No rash, lesions/masses Respiratory: No cough, dyspnea Urologic: No hematuria, dysuria Abdominal:   No nausea, vomiting, diarrhea, bright red blood per rectum, melena, or hematemesis Neurologic:  No visual changes, wkns, changes in mental status. All other systems reviewed and are otherwise negative except as noted above.     Physical Exam    VS:  There were no vitals taken for this visit. , BMI There is no height or weight on file to calculate BMI.     GEN: Well nourished, well developed, in no acute distress. HEENT: normal. Neck: Supple, no JVD, carotid bruits, or masses. Cardiac: RRR, no murmurs, rubs, or gallops. No clubbing, cyanosis, edema.  Radials 2+/PT 2+ and equal bilaterally.  Respiratory:  Respirations regular and unlabored, clear to auscultation bilaterally. GI: Soft,  nontender, nondistended, BS + x 4. MS: no deformity or atrophy. Skin: warm and dry, no rash. Neuro:  Strength and sensation are intact. Psych: Normal affect.  Accessory Clinical Findings    ECG personally reviewed by me today- *** - No acute changes  Lab Results  Component Value Date   WBC 13.3 (H)  01/04/2022   HGB 8.6 (L) 01/04/2022   HCT 29.7 (L) 01/04/2022   MCV 80.1 01/04/2022   PLT 398 01/04/2022   Lab Results  Component Value Date   CREATININE 2.96 (H) 01/04/2022   BUN 41 (H) 01/04/2022   NA 141 01/04/2022   K 5.1 01/04/2022   CL 106 01/04/2022   CO2 27 01/04/2022   Lab Results  Component Value Date   ALT 10 12/15/2021   AST 13 (L) 12/15/2021   ALKPHOS 81 12/15/2021   BILITOT 0.4 12/15/2021   Lab Results  Component Value Date   CHOL 107 12/04/2021   HDL 36 (L) 12/04/2021   LDLCALC 50 12/04/2021   TRIG 112 12/04/2021   CHOLHDL 3.0 12/04/2021    Lab Results  Component Value Date   HGBA1C 10.2 (H) 11/23/2021    Assessment & Plan   1.  ***  Arabel Barcenas, NP 01/21/2022, 9:09 AM

## 2022-01-22 ENCOUNTER — Ambulatory Visit: Payer: Medicare Other | Admitting: Cardiology

## 2022-01-22 ENCOUNTER — Other Ambulatory Visit (HOSPITAL_COMMUNITY): Payer: Self-pay

## 2022-01-22 ENCOUNTER — Encounter: Payer: Self-pay | Admitting: Oncology

## 2022-01-22 DIAGNOSIS — D631 Anemia in chronic kidney disease: Secondary | ICD-10-CM | POA: Diagnosis not present

## 2022-01-22 DIAGNOSIS — E1159 Type 2 diabetes mellitus with other circulatory complications: Secondary | ICD-10-CM | POA: Diagnosis not present

## 2022-01-22 DIAGNOSIS — E1122 Type 2 diabetes mellitus with diabetic chronic kidney disease: Secondary | ICD-10-CM | POA: Diagnosis not present

## 2022-01-22 DIAGNOSIS — N184 Chronic kidney disease, stage 4 (severe): Secondary | ICD-10-CM | POA: Diagnosis not present

## 2022-01-22 DIAGNOSIS — I152 Hypertension secondary to endocrine disorders: Secondary | ICD-10-CM | POA: Diagnosis not present

## 2022-01-22 DIAGNOSIS — I5033 Acute on chronic diastolic (congestive) heart failure: Secondary | ICD-10-CM | POA: Diagnosis not present

## 2022-01-25 ENCOUNTER — Other Ambulatory Visit (HOSPITAL_COMMUNITY): Payer: Self-pay

## 2022-01-25 DIAGNOSIS — E1122 Type 2 diabetes mellitus with diabetic chronic kidney disease: Secondary | ICD-10-CM | POA: Diagnosis not present

## 2022-01-25 DIAGNOSIS — D631 Anemia in chronic kidney disease: Secondary | ICD-10-CM | POA: Diagnosis not present

## 2022-01-25 DIAGNOSIS — I5033 Acute on chronic diastolic (congestive) heart failure: Secondary | ICD-10-CM | POA: Diagnosis not present

## 2022-01-25 DIAGNOSIS — N184 Chronic kidney disease, stage 4 (severe): Secondary | ICD-10-CM | POA: Diagnosis not present

## 2022-01-25 DIAGNOSIS — I152 Hypertension secondary to endocrine disorders: Secondary | ICD-10-CM | POA: Diagnosis not present

## 2022-01-25 DIAGNOSIS — E1159 Type 2 diabetes mellitus with other circulatory complications: Secondary | ICD-10-CM | POA: Diagnosis not present

## 2022-01-26 ENCOUNTER — Telehealth: Payer: Self-pay | Admitting: Oncology

## 2022-01-26 ENCOUNTER — Ambulatory Visit (HOSPITAL_BASED_OUTPATIENT_CLINIC_OR_DEPARTMENT_OTHER): Payer: Medicare Other | Admitting: Internal Medicine

## 2022-01-26 ENCOUNTER — Other Ambulatory Visit
Admission: RE | Admit: 2022-01-26 | Discharge: 2022-01-26 | Disposition: A | Discharge status: Home / Self Care | Payer: Medicare Other | Source: Ambulatory Visit | Attending: Internal Medicine | Admitting: Internal Medicine

## 2022-01-26 ENCOUNTER — Telehealth: Payer: Self-pay

## 2022-01-26 ENCOUNTER — Encounter: Payer: Self-pay | Admitting: Internal Medicine

## 2022-01-26 ENCOUNTER — Ambulatory Visit (INDEPENDENT_AMBULATORY_CARE_PROVIDER_SITE_OTHER): Payer: Medicare Other

## 2022-01-26 VITALS — BP 152/76 | HR 70 | Resp 18 | Ht 66.0 in | Wt 241.5 lb

## 2022-01-26 DIAGNOSIS — I10 Essential (primary) hypertension: Secondary | ICD-10-CM

## 2022-01-26 DIAGNOSIS — R0602 Shortness of breath: Secondary | ICD-10-CM | POA: Insufficient documentation

## 2022-01-26 DIAGNOSIS — N184 Chronic kidney disease, stage 4 (severe): Secondary | ICD-10-CM | POA: Diagnosis not present

## 2022-01-26 DIAGNOSIS — J449 Chronic obstructive pulmonary disease, unspecified: Secondary | ICD-10-CM | POA: Diagnosis not present

## 2022-01-26 DIAGNOSIS — I5022 Chronic systolic (congestive) heart failure: Secondary | ICD-10-CM

## 2022-01-26 DIAGNOSIS — Z6839 Body mass index (BMI) 39.0-39.9, adult: Secondary | ICD-10-CM

## 2022-01-26 LAB — PULMONARY FUNCTION TEST ARMC ONLY
DL/VA % pred: 61 %
DL/VA: 2.51 ml/min/mmHg/L
DLCO unc % pred: 42 %
DLCO unc: 8.99 ml/min/mmHg
FEF 25-75 Post: 0.83 L/sec
FEF 25-75 Pre: 0.83 L/sec
FEF2575-%Change-Post: -1 %
FEF2575-%Pred-Post: 38 %
FEF2575-%Pred-Pre: 38 %
FEV1-%Change-Post: 2 %
FEV1-%Pred-Post: 38 %
FEV1-%Pred-Pre: 37 %
FEV1-Post: 0.97 L
FEV1-Pre: 0.95 L
FEV1FVC-%Change-Post: -13 %
FEV1FVC-%Pred-Pre: 105 %
FEV6-%Change-Post: 17 %
FEV6-%Pred-Post: 43 %
FEV6-%Pred-Pre: 36 %
FEV6-Post: 1.38 L
FEV6-Pre: 1.17 L
FEV6FVC-%Pred-Post: 104 %
FEV6FVC-%Pred-Pre: 104 %
FVC-%Change-Post: 17 %
FVC-%Pred-Post: 41 %
FVC-%Pred-Pre: 35 %
FVC-Post: 1.38 L
FVC-Pre: 1.17 L
Post FEV1/FVC ratio: 70 %
Post FEV6/FVC ratio: 100 %
Pre FEV1/FVC ratio: 81 %
Pre FEV6/FVC Ratio: 100 %
RV % pred: 98 %
RV: 2.19 L
TLC % pred: 70 %
TLC: 3.75 L

## 2022-01-26 LAB — BASIC METABOLIC PANEL
Anion gap: 7 (ref 5–15)
BUN: 32 mg/dL — ABNORMAL HIGH (ref 8–23)
CO2: 23 mmol/L (ref 22–32)
Calcium: 8.2 mg/dL — ABNORMAL LOW (ref 8.9–10.3)
Chloride: 115 mmol/L — ABNORMAL HIGH (ref 98–111)
Creatinine, Ser: 2.75 mg/dL — ABNORMAL HIGH (ref 0.44–1.00)
GFR, Estimated: 18 mL/min — ABNORMAL LOW (ref >60)
Glucose, Bld: 41 mg/dL — CL (ref 70–99)
Potassium: 4.3 mmol/L (ref 3.5–5.1)
Sodium: 145 mmol/L (ref 135–145)

## 2022-01-26 LAB — CBC
HCT: 32.7 % — ABNORMAL LOW (ref 36.0–46.0)
Hemoglobin: 9.2 g/dL — ABNORMAL LOW (ref 12.0–15.0)
MCH: 23.9 pg — ABNORMAL LOW (ref 26.0–34.0)
MCHC: 28.1 g/dL — ABNORMAL LOW (ref 30.0–36.0)
MCV: 84.9 fL (ref 80.0–100.0)
Platelets: 485 10*3/uL — ABNORMAL HIGH (ref 150–400)
RBC: 3.85 MIL/uL — ABNORMAL LOW (ref 3.87–5.11)
RDW: 22.4 % — ABNORMAL HIGH (ref 11.5–15.5)
WBC: 9.2 10*3/uL (ref 4.0–10.5)
nRBC: 0 % (ref 0.0–0.2)

## 2022-01-26 LAB — BRAIN NATRIURETIC PEPTIDE: B Natriuretic Peptide: 2195.7 pg/mL — ABNORMAL HIGH (ref 0.0–100.0)

## 2022-01-26 MED ORDER — ALBUTEROL SULFATE (2.5 MG/3ML) 0.083% IN NEBU
2.5000 mg | INHALATION_SOLUTION | Freq: Once | RESPIRATORY_TRACT | Status: AC
  Administered 2022-01-26: 2.5 mg via RESPIRATORY_TRACT

## 2022-01-26 MED ORDER — TORSEMIDE 20 MG PO TABS: 60.0000 mg | ORAL_TABLET | Freq: Every day | ORAL | 3 refills | Status: DC

## 2022-01-26 MED ORDER — SACUBITRIL-VALSARTAN 49-51 MG PO TABS: 1.0000 | ORAL_TABLET | Freq: Two times a day (BID) | ORAL | 3 refills | Status: DC

## 2022-01-26 NOTE — Progress Notes (Signed)
ADVANCED HF CLINIC CONSULT NOTE  Referring Physician: Darylene Price, NP Primary Care: Gwyneth Sprout, FNP Primary Cardiologist: None Nephrology: Dr. Candiss Norse   HPI:  Ms Dibbern is a 67 y/o female with a history of obesity, DM, HTN, CKD IV, stroke, anemia, anxiety, COPD, depression, tobacco use and chronic diastolic heart failure.   Developed HF in 2/17. Echo EF 25-30%. Normal coronaries.   Echo in 5/17. EF 40-45%  Echo  09/29/21 EF of 45-50% along with moderate LVH & mild LAE.   Admitted 01/01/22 due to fatigue and dyspnea, also increasing bilateral lower extremity swelling.  Hgb was low. Was transfused. Seen by hematology. GIven IV iron. Treated with IV lasix. Also treated for LE wound.  Scr 2.5 -> 3.0. SPEP negative.    Post d/c she followed up with Darylene Price in the [facility] and has been referred here for further evalaution of her HF. Also following with [facility]. Had initial call losartan switched to [facility] and torsemide increased from 20 daily to 20/10. Feels better. SOB with mild activity. Uses a walker as needed. Still smoking occasionally No CP, orthopnea or PND. Weighing daily. Weights have been stable    Review of Systems: [y] = yes, '[ ]'$  = no   General: Weight gain '[ ]'$ ; Weight loss '[ ]'$ ; Anorexia '[ ]'$ ; Fatigue Blue.Reese ]; Fever '[ ]'$ ; Chills '[ ]'$ ; Weakness '[ ]'$   Cardiac: Chest pain/pressure '[ ]'$ ; Resting SOB '[ ]'$ ; Exertional SOB [ y]; Orthopnea '[ ]'$ ; Pedal Edema '[ ]'$ ; Palpitations '[ ]'$ ; Syncope '[ ]'$ ; Presyncope '[ ]'$ ; Paroxysmal nocturnal dyspnea'[ ]'$   Pulmonary: Cough '[ ]'$ ; Wheezing'[ ]'$ ; Hemoptysis'[ ]'$ ; Sputum '[ ]'$ ; Snoring '[ ]'$   GI: Vomiting'[ ]'$ ; Dysphagia'[ ]'$ ; Melena'[ ]'$ ; Hematochezia '[ ]'$ ; Heartburn'[ ]'$ ; Abdominal pain '[ ]'$ ; Constipation '[ ]'$ ; Diarrhea '[ ]'$ ; BRBPR '[ ]'$   GU: Hematuria'[ ]'$ ; Dysuria '[ ]'$ ; Nocturia'[ ]'$   Vascular: Pain in legs with walking '[ ]'$ ; Pain in feet with lying flat '[ ]'$ ; Non-healing sores '[ ]'$ ; Stroke '[ ]'$ ; TIA '[ ]'$ ; Slurred speech '[ ]'$ ;  Neuro: Headaches'[ ]'$ ; Vertigo'[ ]'$ ; Seizures[  ]; Paresthesias'[ ]'$ ;Blurred vision '[ ]'$ ; Diplopia '[ ]'$ ; Vision changes '[ ]'$   Ortho/Skin: Arthritis [ y]; Joint pain Blue.Reese ]; Muscle pain '[ ]'$ ; Joint swelling '[ ]'$ ; Back Pain '[ ]'$ ; Rash '[ ]'$   Psych: Depression[y ]; Anxiety'[ ]'$   Heme: Bleeding problems '[ ]'$ ; Clotting disorders '[ ]'$ ; Anemia '[ ]'$   Endocrine: Diabetes [ y]; Thyroid dysfunction'[ ]'$    Past Medical History:  Diagnosis Date   Anemia    Anxiety    Cardiomyopathy ([place])    new to her Jan 2017   CHF (congestive heart failure) (HCC)    Chronic kidney disease    COPD (chronic obstructive pulmonary disease) (HCC)    Depression    Diabetes mellitus, type II (HCC)    HTN (hypertension)    Leukocytosis    Obesity    Osteoporosis    PONV (postoperative nausea and vomiting)    Stroke [facility])    Jan 2017    Current Outpatient Medications  Medication Sig Dispense Refill   albuterol (VENTOLIN HFA) 108 (90 Base) MCG/ACT inhaler TAKE 2 PUFFS BY MOUTH EVERY 6 HOURS AS NEEDED FOR WHEEZE OR SHORTNESS OF BREATH (Patient taking differently: Inhale 2 puffs into the lungs every 6 (six) hours as needed for wheezing or shortness of breath.) 8.5 each 2   apixaban (ELIQUIS) 5 MG TABS tablet Take 1 tablet (5 mg total)  by mouth 2 (two) times daily. 180 tablet 3   atorvastatin (LIPITOR) 40 MG tablet Take 1 tablet (40 mg total) by mouth daily. 90 tablet 3   buPROPion (WELLBUTRIN SR) 150 MG 12 hr tablet Take 1 tablet (150 mg total) by mouth 2 (two) times daily. 180 tablet 1   carvedilol (COREG) 25 MG tablet Take 1 tablet (25 mg total) by mouth 2 (two) times daily with a meal. 60 tablet 0   clopidogrel (PLAVIX) 75 MG tablet Take 1 tablet (75 mg total) by mouth daily. 90 tablet 3   Dulaglutide 3 MG/0.5ML SOPN Inject into the skin once a week.     fluticasone (FLONASE) 50 MCG/ACT nasal spray Place 2 sprays into both nostrils daily. 16 g 6   gabapentin (NEURONTIN) 300 MG capsule Take 300 mg by mouth 2 (two) times daily.     glucose blood test strip OneTouch Verio strips      insulin aspart (NOVOLOG) 100 UNIT/ML injection Insulin pump     Insulin Disposable Pump (OMNIPOD DASH PODS, GEN 4,) MISC USE 1 POD EACH EVERY 72    HOURS     Insulin Human (INSULIN PUMP) SOLN Per endocrinoloy     loratadine (CLARITIN) 10 MG tablet Take 1 tablet by mouth daily.     losartan (COZAAR) 50 MG tablet Take 1 tablet (50 mg total) by mouth daily. 90 tablet 0   meclizine (ANTIVERT) 12.5 MG tablet Take 1 tablet (12.5 mg total) by mouth 3 (three) times daily as needed for dizziness. 30 tablet 0   pantoprazole (PROTONIX) 40 MG tablet Take 1 tablet (40 mg total) by mouth 2 (two) times daily. 60 tablet 0   PARoxetine (PAXIL) 40 MG tablet Take 1 tablet (40 mg total) by mouth daily. 90 tablet 1   Spacer/Aero-Holding Chambers (OPTICHAMBER DIAMOND) MISC Use with inhaler to ensure medication is delivered throughout lungs 1 each 0   torsemide (DEMADEX) 20 MG tablet Take 20 mg by mouth daily.     traMADol (ULTRAM) 50 MG tablet Take 1 tablet (50 mg total) by mouth every 12 (twelve) hours as needed. 60 tablet 3   Vitamin D, Ergocalciferol, (DRISDOL) 1.25 MG (50000 UNIT) CAPS capsule Take 50,000 Units by mouth every 7 (seven) days.     No current facility-administered medications for this visit.    Allergies  Allergen Reactions   Codeine Nausea And Vomiting   Ivp Dye [Iodinated Contrast Media]     Patients states the IV Dye shuts her kidneys down      Social History   Socioeconomic History   Marital status: Married    Spouse name: Not on file   Number of children: 2   Years of education: Not on file   Highest education level: Bachelor's degree (e.g., BA, AB, BS)  Occupational History   Occupation: disabled    Comment: retired  Tobacco Use   Smoking status: Some Days    Packs/day: 0.50    Years: 47.00    Total pack years: 23.50    Types: Cigarettes    Start date: 02/08/1974   Smokeless tobacco: Never   Tobacco comments:    1-3 cigarettes a day. 12/09/2021 khj; reported to have quit  01/18/2022  Vaping Use   Vaping Use: Never used  Substance and Sexual Activity   Alcohol use: No    Alcohol/week: 0.0 standard drinks of alcohol   Drug use: No   Sexual activity: Yes    Birth control/protection: None  Other Topics  Concern   Not on file  Social History Narrative   Lives at home with stepson and husband, dogs and cats   Caffeine  Drinks sweet tea.   Right handed.    Social Determinants of Health   Financial Resource Strain: Low Risk  (12/11/2021)   Overall Financial Resource Strain (CARDIA)    Difficulty of Paying Living Expenses: Not very hard  Food Insecurity: No Food Insecurity (01/05/2022)   Hunger Vital Sign    Worried About Running Out of Food in the Last Year: Never true    Ran Out of Food in the Last Year: Never true  Transportation Needs: No Transportation Needs (01/05/2022)   PRAPARE - Hydrologist (Medical): No    Lack of Transportation (Non-Medical): No  Physical Activity: Inactive (12/11/2021)   Exercise Vital Sign    Days of Exercise per Week: 0 days    Minutes of Exercise per Session: 0 min  Stress: Stress Concern Present (12/11/2021)   [place]    Feeling of Stress : To some extent  Social Connections: Moderately Isolated (12/11/2021)   Social Connection and Isolation Panel [NHANES]    Frequency of Communication with Friends and Family: More than three times a week    Frequency of Social Gatherings with Friends and Family: Once a week    Attends Religious Services: Never    Marine scientist or Organizations: No    Attends Archivist Meetings: Never    Marital Status: Married  Human resources officer Violence: Not At Risk (01/02/2022)   Humiliation, Afraid, Rape, and Kick questionnaire    Fear of Current or Ex-Partner: No    Emotionally Abused: No    Physically Abused: No    Sexually Abused: No      Family History  Problem Relation  Age of Onset   Heart disease Mother        died from CHF   Asthma Mother    Diabetes Mother    Heart disease Father    Aneurysm Father    COPD Brother    Diabetes Brother    Alcohol abuse Paternal Aunt    Cancer Maternal Grandmother        unknownorigin   Anemia Neg Hx    Arrhythmia Neg Hx    Clotting disorder Neg Hx    Fainting Neg Hx    Heart attack Neg Hx    Heart failure Neg Hx    Hyperlipidemia Neg Hx    Hypertension Neg Hx     Vitals:   01/26/22 0944  BP: (!) 152/76  Pulse: 70  Resp: 18  SpO2: (!) 86%  Weight: 241 lb 8 oz (109.5 kg)  Height: '5\' 6"'$  (1.676 m)   Wt Readings from Last 3 Encounters:  01/26/22 241 lb 8 oz (109.5 kg)  01/18/22 239 lb (108.4 kg)  01/14/22 241 lb (109.3 kg)    PHYSICAL EXAM: General:  Chronically-ill appearing. No respiratory difficulty. Drinking a bottle of water in clinic HEENT: normal Neck: supple. JVP to ear with prominent v-waves Carotids 2+ bilat; no bruits. No lymphadenopathy or thryomegaly appreciated. Cor: PMI nondisplaced. Regular rate & rhythm. No rubs, gallops or murmurs. Lungs: decreased throughout Abdomen: obese soft, nontender, nondistended. No hepatosplenomegaly. No bruits or masses. Good bowel sounds. Extremities: no cyanosis, clubbing, rash, 3+ edema with chronic venous stasis changes Wound on LLE Neuro: alert & oriented x 3, cranial nerves grossly intact. moves  all 4 extremities w/o difficulty. Affect pleasant.  ECG:   ASSESSMENT & PLAN:   1. Chronic HF with mildly decreased EF - Developed HF in 2/17. Echo EF 25-30%. Cath with normal coronaries.  - Echo in 5/17. EF 40-45% - Echo  09/29/21 EF of 45-50% along with moderate LVH & mild LAE. RV normal. No TR to estimate RVSP - Chronic NYHA III - Markedly volume overloaded with R>L HF. Increase torsemide from 20/10 to 60 daily. Will see back next week. If nor t responding will need Furoscix. Will ask HHRN to wrap legs. Will need close f/u of renal function with  diuresis  - GDMT limited by CKD IV but has been tolerating AR. Recently switched from losartan to [facility] 49/51. Agree with this for now. As above, will have to watch renal function closely - No spiro with CKD IV - Continue carvedilol 25 bid for now - Can consider SGLT2i in future if GFR > 20  - labs today - F/u next week to reassess volume status and renal function - Instructed to watch fluid intake closely - Can repeat echo in near future   2. CKD IV - Recent Scr 2.5 -> 3.0. SPEP negative.  - Recheck today - Following with Nephrology  3. Essential HTN - BP up.  - Agree with Entresto for now - Can add amlodipine as needed  4. Chronic hypoxic respiratory  due to COPD with ongoing tobacco use - has home O2 but not wearing regularly - encouraged her to track sats and if < 90% wear O2 - discussed need for smoking cessation  5. LLE wound - continue HHRN - Wrap legs - consider ABIs  6. Morbid obesity - per PCP - can consider GLP-1RA  Glori Bickers, MD  10:46 AM

## 2022-01-26 NOTE — Patient Instructions (Signed)
Medication Changes:  STOP Losartan  START Entresto 49/51 mg Twice daily   INCREASE Torsemide to 60 mg (3 tabs) Daily  We have started the process to get you approved for Furoscix (SQ lasix infusion at home), Your provider has order Furoscix for you. This is an on-body infuser that gives you a dose of Furosemide.   It will be shipped to your home   [employer] [facility] will call you to discuss before shipping so, PLEASE answer unknown calls  For questions regarding the device call [employer] at [phone]  Ensure you write down the time you start your infusion so that if there is a problem you will know how long the infusion lasted  Use Furoscix only AS DIRECTED by our office  When you receive the kits DO NOT USE THEM UNLESS ADVISED TO DO SO BY OUR OFFICE    Lab Work:  Labs done today, your results will be available in MyChart, we will contact you for abnormal readings.   Testing/Procedures:  none  Referrals:  none  Special Instructions // Education:  Do the following things EVERYDAY: Weigh yourself in the morning before breakfast. Write it down and keep it in a log. Take your medicines as prescribed Eat low salt foods--Limit salt (sodium) to 2000 mg per day.  Stay as active as you can everyday Limit all fluids for the day to less than 2 liters   Follow-Up in: 1 week with Otila Kluver and 6 weeks with Dr Haroldine Laws    If you have any questions or concerns before your next appointment please send [place] a message through [place] or call our office at [phone] Monday-Friday 8 am-5 pm.   If you have an urgent need after hours on the weekend please call your Primary Cardiologist or the [facility] in [place] at [phone].

## 2022-01-26 NOTE — Progress Notes (Signed)
Order for bilat unna boots faxed to [facility] at [phone]

## 2022-01-26 NOTE — Progress Notes (Unsigned)
MRN : [id number]  Jocelyn Wells is a 66 y.o. (02/24/1955) female who presents with chief complaint of check circulation.  History of Present Illness:   he patient returns to the office for followup and review status post angiogram with intervention on 08/12/2021.    Procedure:  Percutaneous transluminal angioplasty and stent placement left superficial       femoral artery and popliteal to 5 mm proximally and 4 mm distally. 2.    Percutaneous transluminal angioplasty of the left common iliac artery to 9 mm.   3.    Percutaneous transluminal angioplasty of the left peroneal artery to 3 mm.   4.    Mechanical thrombectomy left tibioperoneal trunk and peroneal.    The patient notes she fell a couple weeks ago and now has an ulcer on the right shin/ankle. She reports her left shin ulcer is improving.  No new ulcers or wounds have occurred on the left since the last visit.   Her post angio course was complicated by acute renal failure ans she did require dialysis.  She subsequently has returned to her baseline stage 4 renal dysfunction as noted by her renal function studies dated December 07, 2021.   The patient is seen for evaluation for dialysis access. The patient has chronic renal insufficiency stage IV secondary.  The patient's most recent creatinine clearance is 17 (obtained 01/04/2022). The patient volume status has not yet become an issue. Patient's blood pressures been relatively well controlled. There are minimal uremic symptoms which appear to be relatively well tolerated at this time.   The patient is also followed for DVT.  DVT was identified at [facility] by Duplex ultrasound in June 2020.  The initial symptoms were pain and swelling in the lower extremity.  She was initiated on anticoagulation at that time    No documented history of amaurosis fugax or recent TIA symptoms. There are no recent neurological changes noted. No documented history of DVT, PE or  superficial thrombophlebitis. The patient denies recent episodes of angina or shortness of breath.    ABI's today Rt=0.74  (monophasic) and Lt=1.11 (triphasic)  (previous ABI's Rt=0.79 and Lt=0.96)   Previous duplex US of the upper extremities for vein mapping shows an excellent cephalic vein at the antecubital fossa measuring 5 mm in diameter.   No outpatient medications have been marked as taking for the 01/28/22 encounter (Appointment) with Delana Meyer, Dolores Lory, MD.    Past Medical History:  Diagnosis Date   Anemia    Anxiety    Cardiomyopathy [facility])    new to her Jan 2017   CHF (congestive heart failure) (HCC)    Chronic kidney disease    COPD (chronic obstructive pulmonary disease) ([place])    Depression    Diabetes mellitus, type II ([place])    HTN (hypertension)    Leukocytosis    Obesity    Osteoporosis    PONV (postoperative nausea and vomiting)    Stroke [facility])    Jan 2017    Past Surgical History:  Procedure Laterality Date   ABDOMINAL HYSTERECTOMY     ANKLE FRACTURE SURGERY Left 02/09/2000   BILATERAL SALPINGOOPHORECTOMY  02/08/1998   CARDIAC CATHETERIZATION N/A 03/25/2015   Procedure: Right/Left Heart Cath and Coronary Angiography;  Surgeon: Belva Crome, MD;  Location: [place] [facility];  Service: Cardiovascular;  Laterality: N/A;   CESAREAN SECTION  02/09/1976  COLONOSCOPY WITH PROPOFOL N/A 09/22/2016   Procedure: COLONOSCOPY WITH PROPOFOL;  Surgeon: Manya Silvas, MD;  Location: [facility] ENDOSCOPY;  Service: Endoscopy;  Laterality: N/A;   EP IMPLANTABLE DEVICE N/A 08/05/2015   Procedure: Loop Recorder Insertion;  Surgeon: Deboraha Sprang, MD;  Location: [place] [facility];  Service: Cardiovascular;  Laterality: N/A;   ESOPHAGOGASTRODUODENOSCOPY (EGD) WITH PROPOFOL N/A 09/22/2016   Procedure: ESOPHAGOGASTRODUODENOSCOPY (EGD) WITH PROPOFOL;  Surgeon: Manya Silvas, MD;  Location: [facility] ENDOSCOPY;  Service: Endoscopy;  Laterality: N/A;    ESOPHAGOGASTRODUODENOSCOPY (EGD) WITH PROPOFOL N/A 12/08/2016   Procedure: ESOPHAGOGASTRODUODENOSCOPY (EGD) WITH PROPOFOL;  Surgeon: Manya Silvas, MD;  Location: [facility] ENDOSCOPY;  Service: Endoscopy;  Laterality: N/A;   FRACTURE SURGERY     KNEE ARTHROSCOPY Left 02/09/2003   LOWER EXTREMITY ANGIOGRAPHY Left 08/12/2021   Procedure: Lower Extremity Angiography;  Surgeon: Katha Cabal, MD;  Location: [place] [facility];  Service: Cardiovascular;  Laterality: Left;   TEE WITHOUT CARDIOVERSION N/A 01/31/2015   Procedure: TRANSESOPHAGEAL ECHOCARDIOGRAM (TEE);  Surgeon: Lelon Perla, MD;  Location: [facility] ENDOSCOPY;  Service: Cardiovascular;  Laterality: N/A;   TEMPORARY DIALYSIS CATHETER N/A 08/14/2021   Procedure: TEMPORARY DIALYSIS CATHETER;  Surgeon: Katha Cabal, MD;  Location: [place] [facility];  Service: Cardiovascular;  Laterality: N/A;   TUBAL LIGATION  02/09/1976   ULNAR NERVE TRANSPOSITION  02/08/2006    Social History Social History   Tobacco Use   Smoking status: Some Days    Packs/day: 0.50    Years: 47.00    Total pack years: 23.50    Types: Cigarettes    Start date: 02/08/1974   Smokeless tobacco: Never   Tobacco comments:    1-3 cigarettes a day. 12/09/2021 khj; reported to have quit 01/18/2022  Vaping Use   Vaping Use: Never used  Substance Use Topics   Alcohol use: No    Alcohol/week: 0.0 standard drinks of alcohol   Drug use: No    Family History Family History  Problem Relation Age of Onset   Heart disease Mother        died from CHF   Asthma Mother    Diabetes Mother    Heart disease Father    Aneurysm Father    COPD Brother    Diabetes Brother    Alcohol abuse Paternal Aunt    Cancer Maternal Grandmother        unknownorigin   Anemia Neg Hx    Arrhythmia Neg Hx    Clotting disorder Neg Hx    Fainting Neg Hx    Heart attack Neg Hx    Heart failure Neg Hx    Hyperlipidemia Neg Hx    Hypertension Neg Hx     Allergies  Allergen  Reactions   Codeine Nausea And Vomiting   Ivp Dye [Iodinated Contrast Media]     Patients states the IV Dye shuts her kidneys down     REVIEW OF SYSTEMS (Negative unless checked)  Constitutional: '[]'$ Weight loss  '[]'$ Fever  '[]'$ Chills Cardiac: '[]'$ Chest pain   '[]'$ Chest pressure   '[]'$ Palpitations   '[]'$ Shortness of breath when laying flat   '[]'$ Shortness of breath with exertion. Vascular:  '[x]'$ Pain in legs with walking   '[]'$ Pain in legs at rest  '[]'$ History of DVT   '[]'$ Phlebitis   '[]'$ Swelling in legs   '[]'$ Varicose veins   '[]'$ Non-healing ulcers Pulmonary:   '[]'$ Uses home oxygen   '[]'$ Productive cough   '[]'$ Hemoptysis   '[]'$ Wheeze  '[]'$ COPD   '[]'$ Asthma Neurologic:  '[]'$   Dizziness   '[]'$ Seizures   '[]'$ History of stroke   '[]'$ History of TIA  '[]'$ Aphasia   '[]'$ Vissual changes   '[]'$ Weakness or numbness in arm   '[]'$ Weakness or numbness in leg Musculoskeletal:   '[]'$ Joint swelling   '[]'$ Joint pain   '[]'$ Low back pain Hematologic:  '[]'$ Easy bruising  '[]'$ Easy bleeding   '[]'$ Hypercoagulable state   '[]'$ Anemic Gastrointestinal:  '[]'$ Diarrhea   '[]'$ Vomiting  '[]'$ Gastroesophageal reflux/heartburn   '[]'$ Difficulty swallowing. Genitourinary:  '[]'$ Chronic kidney disease   '[]'$ Difficult urination  '[]'$ Frequent urination   '[]'$ Blood in urine Skin:  '[]'$ Rashes   '[]'$ Ulcers  Psychological:  '[]'$ History of anxiety   '[]'$  History of major depression.  Physical Examination  There were no vitals filed for this visit. There is no height or weight on file to calculate BMI. Gen: WD/WN, NAD Head: [place]/AT, No temporalis wasting.  Ear/Nose/Throat: Hearing grossly intact, nares w/o erythema or drainage Eyes: PER, EOMI, sclera nonicteric.  Neck: Supple, no masses.  No bruit or JVD.  Pulmonary:  Good air movement, no audible wheezing, no use of accessory muscles.  Cardiac: RRR, normal S1, S2, no Murmurs. Vascular:  mild trophic changes, no open wounds Vessel Right Left  Radial Palpable Palpable  PT Not Palpable Not Palpable  DP Not Palpable Not Palpable  Gastrointestinal: soft, non-distended. No  guarding/no peritoneal signs.  Musculoskeletal: M/S 5/5 throughout.  No visible deformity.  Neurologic: CN 2-12 intact. Pain and light touch intact in extremities.  Symmetrical.  Speech is fluent. Motor exam as listed above. Psychiatric: Judgment intact, Mood & affect appropriate for pt's clinical situation. Dermatologic: No rashes or ulcers noted.  No changes consistent with cellulitis.   CBC Lab Results  Component Value Date   WBC 9.2 01/26/2022   HGB 9.2 (L) 01/26/2022   HCT 32.7 (L) 01/26/2022   MCV 84.9 01/26/2022   PLT 485 (H) 01/26/2022    BMET    Component Value Date/Time   NA 145 01/26/2022 1106   NA 140 12/04/2021 1542   K 4.3 01/26/2022 1106   CL 115 (H) 01/26/2022 1106   CO2 23 01/26/2022 1106   GLUCOSE 41 (LL) 01/26/2022 1106   BUN 32 (H) 01/26/2022 1106   BUN 38 (H) 12/04/2021 1542   CREATININE 2.75 (H) 01/26/2022 1106   CREATININE 1.37 (H) 03/21/2015 1451   CALCIUM 8.2 (L) 01/26/2022 1106   GFRNONAA 18 (L) 01/26/2022 1106   GFRAA 33 (L) 04/23/2019 1446   Estimated Creatinine Clearance: 24.9 mL/min (A) (by C-G formula based on SCr of 2.75 mg/dL (H)).  COAG Lab Results  Component Value Date   INR 1.5 (H) 08/14/2021   INR 0.93 10/05/2017   INR 1.05 03/21/2015    Radiology VAS [place] ABI WITH/WO TBI  Result Date: 01/25/2022  LOWER EXTREMITY DOPPLER STUDY Patient Name:  Jocelyn Wells  Date of Exam:   01/14/2022 Medical Rec #: [id number]       Accession #:    [id number] Date of Birth: 31-Jan-1956       Patient Gender: F Patient Age:   22 years Exam Location:  [facility] Procedure:      VAS [place] ABI WITH/WO TBI Referring Phys: [facility] --------------------------------------------------------------------------------  Indications: Rest pain, and ulceration. High Risk Factors: Hypertension, hyperlipidemia, Diabetes, current smoker.  Vascular Interventions: 08/14/2021: Insertion of Temporary dialysis catheter                         Right Femoral  Approach. Performing Technologist: Delorise Shiner  RVT  Examination Guidelines: A complete evaluation includes at minimum, Doppler waveform signals and systolic blood pressure reading at the level of bilateral brachial, anterior tibial, and posterior tibial arteries, when vessel segments are accessible. Bilateral testing is considered an integral part of a complete examination. Photoelectric Plethysmograph (PPG) waveforms and toe systolic pressure readings are included as required and additional duplex testing as needed. Limited examinations for reoccurring indications may be performed as noted.  ABI Findings: +---------+------------------+-----+----------+--------+ Right    Rt Pressure (mmHg)IndexWaveform  Comment  +---------+------------------+-----+----------+--------+ Brachial 163                                       +---------+------------------+-----+----------+--------+ PTA      121               0.74 monophasic         +---------+------------------+-----+----------+--------+ DP       99                0.61 monophasic         +---------+------------------+-----+----------+--------+ Great Toe64                0.39                    +---------+------------------+-----+----------+--------+ +---------+------------------+-----+---------+-------+ Left     Lt Pressure (mmHg)IndexWaveform Comment +---------+------------------+-----+---------+-------+ Brachial 160                                     +---------+------------------+-----+---------+-------+ PTA      136               0.83 biphasic         +---------+------------------+-----+---------+-------+ PERO     179               1.10 triphasic        +---------+------------------+-----+---------+-------+ DP       181               1.11 triphasic        +---------+------------------+-----+---------+-------+ Great Toe173               1.06                   +---------+------------------+-----+---------+-------+ +-------+-----------+-----------+------------+------------+ ABI/TBIToday's ABIToday's TBIPrevious ABIPrevious TBI +-------+-----------+-----------+------------+------------+ Right  0.74       0.39       0.79        0.63         +-------+-----------+-----------+------------+------------+ Left   1.11       1.06       0.96        0.76         +-------+-----------+-----------+------------+------------+  Bilateral ABIs appear essentially unchanged compared to prior study on 10/15/2021.  Summary: Right: Resting right ankle-brachial index indicates moderate right lower extremity arterial disease. The right toe-brachial index is abnormal. Left: Resting left ankle-brachial index is within normal range. The left toe-brachial index is normal. *See table(s) above for measurements and observations.  Electronically signed by [name] MD on 01/25/2022 at 4:31:09 PM.    Final    DG Chest 2 View  Result Date: 01/01/2022 CLINICAL DATA:  Shortness of breath, chest pain. EXAM: CHEST - 2 VIEW COMPARISON:  August 14, 2021. FINDINGS: Stable cardiomegaly. Mild central pulmonary vascular congestion is noted.  Possible bibasilar pulmonary edema or atelectasis is noted. Bony thorax is unremarkable. IMPRESSION: Stable cardiomegaly with mild central pulmonary vascular congestion and possible bibasilar pulmonary edema or atelectasis. Electronically Signed   By: Marijo Conception M.D.   On: 01/01/2022 15:28     Assessment/Plan There are no diagnoses linked to this encounter.   [name], MD  01/26/2022 3:28 PM

## 2022-01-26 NOTE — Telephone Encounter (Signed)
Left VM with pt requesting she call back to schedule Venofer x 5.

## 2022-01-26 NOTE — Telephone Encounter (Signed)
Spoke w/pt, she states she was feeling bad after leaving our office and got a snack, she feels fine now, will check glucose later and f/u with PCP if has low readings.

## 2022-01-26 NOTE — Telephone Encounter (Signed)
Pt and patient's spouse called to report critical lab results: Glucose of 41. Pt was in office for an appointment earlier this morning. Left message with both numbers informing patient of lab result, and instructed patient to eat something as needed, check home glucometer blood sugar level and adjust insulin pump as needed.  Georg Ruddle, RN

## 2022-01-27 ENCOUNTER — Ambulatory Visit: Payer: Self-pay

## 2022-01-27 ENCOUNTER — Telehealth: Payer: Self-pay | Admitting: Family Medicine

## 2022-01-27 DIAGNOSIS — I5033 Acute on chronic diastolic (congestive) heart failure: Secondary | ICD-10-CM | POA: Diagnosis not present

## 2022-01-27 DIAGNOSIS — N184 Chronic kidney disease, stage 4 (severe): Secondary | ICD-10-CM | POA: Diagnosis not present

## 2022-01-27 DIAGNOSIS — E1159 Type 2 diabetes mellitus with other circulatory complications: Secondary | ICD-10-CM | POA: Diagnosis not present

## 2022-01-27 DIAGNOSIS — I152 Hypertension secondary to endocrine disorders: Secondary | ICD-10-CM | POA: Diagnosis not present

## 2022-01-27 DIAGNOSIS — E1122 Type 2 diabetes mellitus with diabetic chronic kidney disease: Secondary | ICD-10-CM | POA: Diagnosis not present

## 2022-01-27 DIAGNOSIS — D631 Anemia in chronic kidney disease: Secondary | ICD-10-CM | POA: Diagnosis not present

## 2022-01-27 NOTE — Telephone Encounter (Signed)
Joya Gaskins, PT, reports pt. Jocelyn Wells yesterday getting in the car. No injury. She saw pt. Today at her home.

## 2022-01-27 NOTE — Progress Notes (Signed)
Discussed [employer] with patient, advised we will start PA process, she is aware the company may contact her but she will not use any kits unless advised to do so by our office. [employer] order form completed and signed by Dr Haroldine Laws. Order form, ins info, & OV note all faxed into [employer].

## 2022-01-27 NOTE — Telephone Encounter (Signed)
PT Jocelyn Wells from [facility] called to let Daneil Dan know that the pt reported a fall / with no injury or problems

## 2022-01-28 ENCOUNTER — Ambulatory Visit (INDEPENDENT_AMBULATORY_CARE_PROVIDER_SITE_OTHER): Payer: Medicare Other | Admitting: Vascular Surgery

## 2022-01-28 ENCOUNTER — Encounter (INDEPENDENT_AMBULATORY_CARE_PROVIDER_SITE_OTHER): Payer: Self-pay | Admitting: Vascular Surgery

## 2022-01-28 ENCOUNTER — Other Ambulatory Visit: Payer: Self-pay | Admitting: Family Medicine

## 2022-01-28 VITALS — BP 130/67 | HR 71 | Resp 16 | Wt 236.6 lb

## 2022-01-28 DIAGNOSIS — I82511 Chronic embolism and thrombosis of right femoral vein: Secondary | ICD-10-CM | POA: Diagnosis not present

## 2022-01-28 DIAGNOSIS — I152 Hypertension secondary to endocrine disorders: Secondary | ICD-10-CM | POA: Diagnosis not present

## 2022-01-28 DIAGNOSIS — E1159 Type 2 diabetes mellitus with other circulatory complications: Secondary | ICD-10-CM | POA: Diagnosis not present

## 2022-01-28 DIAGNOSIS — E1122 Type 2 diabetes mellitus with diabetic chronic kidney disease: Secondary | ICD-10-CM | POA: Diagnosis not present

## 2022-01-28 DIAGNOSIS — I7025 Atherosclerosis of native arteries of other extremities with ulceration: Secondary | ICD-10-CM | POA: Diagnosis not present

## 2022-01-28 DIAGNOSIS — I10 Essential (primary) hypertension: Secondary | ICD-10-CM

## 2022-01-28 DIAGNOSIS — D631 Anemia in chronic kidney disease: Secondary | ICD-10-CM | POA: Diagnosis not present

## 2022-01-28 DIAGNOSIS — E1129 Type 2 diabetes mellitus with other diabetic kidney complication: Secondary | ICD-10-CM

## 2022-01-28 DIAGNOSIS — N184 Chronic kidney disease, stage 4 (severe): Secondary | ICD-10-CM | POA: Diagnosis not present

## 2022-01-28 DIAGNOSIS — I5033 Acute on chronic diastolic (congestive) heart failure: Secondary | ICD-10-CM | POA: Diagnosis not present

## 2022-01-28 DIAGNOSIS — J449 Chronic obstructive pulmonary disease, unspecified: Secondary | ICD-10-CM

## 2022-01-29 ENCOUNTER — Encounter: Payer: Self-pay | Admitting: Pulmonary Disease

## 2022-01-29 ENCOUNTER — Ambulatory Visit (INDEPENDENT_AMBULATORY_CARE_PROVIDER_SITE_OTHER): Payer: Medicare Other | Admitting: Pulmonary Disease

## 2022-01-29 VITALS — BP 138/78 | HR 67 | Temp 97.8°F | Ht 66.0 in | Wt 238.0 lb

## 2022-01-29 DIAGNOSIS — E66812 Obesity, class 2: Secondary | ICD-10-CM

## 2022-01-29 DIAGNOSIS — Z6838 Body mass index (BMI) 38.0-38.9, adult: Secondary | ICD-10-CM | POA: Diagnosis not present

## 2022-01-29 DIAGNOSIS — E1159 Type 2 diabetes mellitus with other circulatory complications: Secondary | ICD-10-CM | POA: Diagnosis not present

## 2022-01-29 DIAGNOSIS — D631 Anemia in chronic kidney disease: Secondary | ICD-10-CM

## 2022-01-29 DIAGNOSIS — E662 Morbid (severe) obesity with alveolar hypoventilation: Secondary | ICD-10-CM | POA: Diagnosis not present

## 2022-01-29 DIAGNOSIS — N184 Chronic kidney disease, stage 4 (severe): Secondary | ICD-10-CM | POA: Diagnosis not present

## 2022-01-29 DIAGNOSIS — I5032 Chronic diastolic (congestive) heart failure: Secondary | ICD-10-CM | POA: Diagnosis not present

## 2022-01-29 DIAGNOSIS — I5033 Acute on chronic diastolic (congestive) heart failure: Secondary | ICD-10-CM | POA: Diagnosis not present

## 2022-01-29 DIAGNOSIS — I152 Hypertension secondary to endocrine disorders: Secondary | ICD-10-CM | POA: Diagnosis not present

## 2022-01-29 DIAGNOSIS — J9611 Chronic respiratory failure with hypoxia: Secondary | ICD-10-CM | POA: Diagnosis not present

## 2022-01-29 DIAGNOSIS — J449 Chronic obstructive pulmonary disease, unspecified: Secondary | ICD-10-CM | POA: Diagnosis not present

## 2022-01-29 DIAGNOSIS — E1122 Type 2 diabetes mellitus with diabetic chronic kidney disease: Secondary | ICD-10-CM | POA: Diagnosis not present

## 2022-01-29 DIAGNOSIS — R0600 Dyspnea, unspecified: Secondary | ICD-10-CM

## 2022-01-29 MED ORDER — TRELEGY ELLIPTA 100-62.5-25 MCG/ACT IN AEPB: 1.0000 | INHALATION_SPRAY | Freq: Every day | RESPIRATORY_TRACT | 11 refills | Status: DC

## 2022-01-29 MED ORDER — TRELEGY ELLIPTA 100-62.5-25 MCG/ACT IN AEPB: 1.0000 | INHALATION_SPRAY | Freq: Every day | RESPIRATORY_TRACT | 0 refills | Status: DC

## 2022-01-29 NOTE — Patient Instructions (Signed)
You need to wear your oxygen when you are walking and during sleep.  You require 3 L/min when you are walking and 2 L/min during sleep.  Have sent a new order to your DME company (oxygen supplier)  We are giving you a trial of an inhaler called Trelegy this is 1 puff daily.  You have been given a coupon to get your first prescription free but you need to use it before 31 December.  We will see you in follow-up in 3 months time call sooner should any new problems arise.

## 2022-01-29 NOTE — Progress Notes (Signed)
Subjective:    Patient ID: Jocelyn Wells, female    DOB: [birth year], 66 y.o.   MRN: [id number] Patient Care Team: Jacky Kindle, FNP as PCP - General (Family Medicine) Tedd Sias Marlana Salvage, MD as Physician Assistant (Endocrinology) Duke Salvia, MD as Consulting Physician (Cardiology) Brandy Hale, MD as Referring Physician (Psychiatry) Deirdre Evener, MD as Consulting Physician (Dermatology) Dimmig, Maisie Fus, MD as Referring Physician (Orthopedic Surgery) Schnier, Latina Craver, MD (Vascular Surgery) Albin Felling, OD (Optometry) Gaspar Cola, [facility] (Pharmacist) Salena Saner, MD as Consulting Physician (Pulmonary Disease) Juanell Fairly, RN as Case Manager  Chief Complaint  Patient presents with   Follow-up    SOB with exertion. Little wheezing. No cough.   HPI Jocelyn Wells is a 66 year old smoker (resumed habit, 36 PY) who presents for follow-up on the issue of dyspnea on exertion, presumed COPD and obesity with obesity hypoventilation.  She was initially evaluated on 06 October 2021.  We last saw her on 09 December 2021 and at that time she was to reschedule PFTs that she had never realized. With regards to dyspnea on exertion she refers to this mostly as fatigue. Inexplicably she has started smoking again but limiting herself to 3 cigarettes/day.  She had an overnight oximetry performed on 19 September which showed that she had desaturations to as low as 74%.  Oxygen was ordered for the patient however [employer] has never contacted the patient for set up.  Previously she had had a split-night sleep study in March 2017 that did not show central nor obstructive sleep apnea however the patient did have significant oxygen desaturations likely related to obesity with alveolar hypoventilation.   Today the patient presents with no significant respiratory complaint.  She does not endorse any cough or sputum production.  No hemoptysis.  No chest pain.  Overall she just feels "poorly" and gets fatigued  easily.  No chest pain, no calf tenderness.  She does have chronic lower extremity edema.   She does not endorse any other symptomatology.  Use of as needed albuterol this perhaps once to twice a week.     Review of Systems A 10 point review of systems was performed and it is as noted above otherwise negative.  Patient Active Problem List   Diagnosis Date Noted   Symptomatic anemia 01/01/2022   Foot pain, right 12/22/2021   Pain of right lower extremity 12/22/2021   Iron deficiency anemia 12/17/2021   Personal history of nicotine dependence 12/04/2021   Leukocytosis 12/04/2021   Long toenail 12/04/2021   Screening for colon cancer 12/04/2021   Depression, recurrent (HCC) 12/04/2021   Hospital discharge follow-up 09/02/2021   Hyperkalemia 08/15/2021   Acute on chronic diastolic CHF (congestive heart failure) (HCC) 08/14/2021   Atherosclerosis of native arteries of the extremities with ulceration (HCC) 08/12/2021   Positive depression screening 07/14/2021   DOE (dyspnea on exertion) 07/14/2021   Bilateral leg edema 07/14/2021   Frequent falls 04/13/2021   Anemia in chronic kidney disease 03/18/2021   Heart failure, unspecified (HCC) 03/18/2021   Hyperparathyroidism due to renal insufficiency (HCC) 03/18/2021   Chronic depression 03/18/2021   Overweight 03/18/2021   Poor appetite 02/13/2021   MDD (major depressive disorder), recurrent episode, mild (HCC) 02/13/2021   Benign hypertensive kidney disease with chronic kidney disease 10/14/2020   CKD (chronic kidney disease), stage IV (HCC) 05/31/2020   Compression fracture of L1 lumbar vertebra (HCC) 01/30/2019   Compression fracture of body of thoracic vertebra (HCC) 01/30/2019  Lumbar foraminal stenosis (RIGHT L1) 01/30/2019   Spinal stenosis of lumbar region with neurogenic claudication (L4-L5) 01/30/2019   Osteoporosis, post-menopausal 04/14/2018   Vitamin D deficiency 04/14/2018   Chronic deep vein thrombosis (DVT) (HCC)  02/09/2018   Post-menopausal 11/17/2017   Aortic atherosclerosis (HCC) 11/08/2017   Snoring 04/24/2015   Sleep paralysis, recurrent isolated 04/24/2015   Hypersomnia with sleep apnea 04/24/2015   Cataplexy 04/24/2015   Morbid obesity due to excess calories (HCC) 04/24/2015   COPD (chronic obstructive pulmonary disease) (HCC) 04/24/2015   Embolic stroke (HCC) 04/23/2015   Acute renal failure superimposed on stage 4 chronic kidney disease (HCC) 01/29/2015   Dizziness 07/05/2014   Essential hypertension 07/05/2014   Carbuncle and furuncle 07/05/2014   Pain of perianal area 07/05/2014   Guttate psoriasis 07/05/2014   H/O: osteoarthritis 06/05/2014   Anxiety, generalized 06/05/2014   Microalbuminuria 12/23/2013   Long term current use of insulin (HCC) 12/23/2013   Type 2 diabetes mellitus with other diabetic kidney complication (HCC) 12/23/2013   Disturbance of skin sensation 09/02/2006   Difficulty hearing 07/25/2006   Cephalalgia 05/27/2006   Hyperlipidemia associated with type 2 diabetes mellitus (HCC) 10/22/2005   Arthritis, degenerative 10/22/2005   Social History   Tobacco Use   Smoking status: Some Days    Packs/day: 0.50    Years: 47.00    Total pack years: 23.50    Types: Cigarettes    Start date: 02/08/1974   Smokeless tobacco: Never   Tobacco comments:    Quit 01/18/2022  Substance Use Topics   Alcohol use: No    Alcohol/week: 0.0 standard drinks of alcohol   Allergies  Allergen Reactions   Codeine Nausea And Vomiting   Ivp Dye [Iodinated Contrast Media]     Patients states the IV Dye shuts her kidneys down   Current Meds  Medication Sig   albuterol (VENTOLIN HFA) 108 (90 Base) MCG/ACT inhaler TAKE 2 PUFFS BY MOUTH EVERY 6 HOURS AS NEEDED FOR WHEEZE OR SHORTNESS OF BREATH (Patient taking differently: Inhale 2 puffs into the lungs every 6 (six) hours as needed for wheezing or shortness of breath.)   apixaban (ELIQUIS) 5 MG TABS tablet Take 1 tablet (5 mg total)  by mouth 2 (two) times daily.   atorvastatin (LIPITOR) 40 MG tablet Take 1 tablet (40 mg total) by mouth daily.   buPROPion (WELLBUTRIN SR) 150 MG 12 hr tablet Take 1 tablet (150 mg total) by mouth 2 (two) times daily.   carvedilol (COREG) 25 MG tablet Take 1 tablet (25 mg total) by mouth 2 (two) times daily with a meal.   clopidogrel (PLAVIX) 75 MG tablet Take 1 tablet (75 mg total) by mouth daily.   Dulaglutide 3 MG/0.5ML SOPN Inject into the skin once a week.   fluticasone (FLONASE) 50 MCG/ACT nasal spray Place 2 sprays into both nostrils daily.   gabapentin (NEURONTIN) 300 MG capsule Take 300 mg by mouth 2 (two) times daily.   glucose blood test strip OneTouch Verio strips   insulin aspart (NOVOLOG) 100 UNIT/ML injection Insulin pump   Insulin Disposable Pump (OMNIPOD DASH PODS, GEN 4,) MISC USE 1 POD EACH EVERY 72    HOURS   Insulin Human (INSULIN PUMP) SOLN Per endocrinoloy   loratadine (CLARITIN) 10 MG tablet Take 1 tablet by mouth daily.   meclizine (ANTIVERT) 12.5 MG tablet Take 1 tablet (12.5 mg total) by mouth 3 (three) times daily as needed for dizziness.   pantoprazole (PROTONIX) 40 MG tablet Take  1 tablet (40 mg total) by mouth 2 (two) times daily.   PARoxetine (PAXIL) 40 MG tablet Take 1 tablet (40 mg total) by mouth daily.   sacubitril-valsartan (ENTRESTO) 49-51 MG Take 1 tablet by mouth 2 (two) times daily.   Spacer/Aero-Holding Chambers [facility] DIAMOND) MISC Use with inhaler to ensure medication is delivered throughout lungs   torsemide (DEMADEX) 20 MG tablet Take 3 tablets (60 mg total) by mouth daily.   traMADol (ULTRAM) 50 MG tablet Take 1 tablet (50 mg total) by mouth every 12 (twelve) hours as needed.   Vitamin D, Ergocalciferol, (DRISDOL) 1.25 MG (50000 UNIT) CAPS capsule Take 50,000 Units by mouth every 7 (seven) days.   Immunization History  Administered Date(s) Administered   Fluad Quad(high Dose 65+) 01/07/2021, 11/23/2021   Influenza Inj Mdck Quad Pf  12/20/2018   Influenza Split 01/05/2006, 10/31/2007, 03/03/2009, 12/16/2009, 12/23/2010, 12/24/2011, 12/19/2013   Influenza,inj,Quad PF,6+ Mos 11/23/2012, 11/07/2015, 11/15/2017   Influenza-Unspecified 11/07/2015, 11/03/2016   PFIZER(Purple Top)SARS-COV-2 Vaccination 03/01/2019, 03/22/2019   Pneumococcal Polysaccharide-23 12/23/2010   Zoster Recombinat (Shingrix) 06/03/2016, 12/11/2016         Objective:   Physical Exam BP 138/78 (BP Location: Left Arm, Cuff Size: Large)   Pulse 67   Temp 97.8 F (36.6 C)   Ht 5\' 6"  (1.676 m)   Wt 238 lb (108 kg) Comment: per patient. she is in a wheelchair  SpO2 96%   BMI 38.41 kg/m   SpO2: 96 % O2 Device: None (Room air)  GENERAL: Obese woman, sallow appearance, presents in transport chair.  No respiratory distress.  Initial oxygen saturations at 89%, 97% after settling in room.   HEAD: Normocephalic, atraumatic.  EYES: Pupils equal, round, reactive to light.  No scleral icterus.  MOUTH: Edentulous, oral mucosa moist.  No thrush. NECK: Supple. No thyromegaly. Trachea midline. No JVD.  No adenopathy. PULMONARY: Good air entry bilaterally.  No adventitious sounds. CARDIOVASCULAR: S1 and S2. Regular rate and rhythm.  ABDOMEN: Obese otherwise benign. MUSCULOSKELETAL: No joint deformity, no clubbing, 2+ edema of the lower extremities.  NEUROLOGIC: Grossly nonfocal. SKIN: Intact,warm,dry.  Chronic stasis changes lower extremities wound on the shin of left leg healing. PSYCH: Flat affect.  Pulmonary Function Test [facility] Only     Status: None   Collection Time: 01/26/22  1:45 PM  Result Value Ref Range   FVC-Pre 1.17 L   FVC-%Pred-Pre 35 %   FVC-Post 1.38 L   FVC-%Pred-Post 41 %   FVC-%Change-Post 17 %   FEV1-Pre 0.95 L   FEV1-%Pred-Pre 37 %   FEV1-Post 0.97 L   FEV1-%Pred-Post 38 %   FEV1-%Change-Post 2 %   FEV6-Pre 1.17 L   FEV6-%Pred-Pre 36 %   FEV6-Post 1.38 L   FEV6-%Pred-Post 43 %   FEV6-%Change-Post 17 %   Pre FEV1/FVC ratio  81 %   FEV1FVC-%Pred-Pre 105 %   Post FEV1/FVC ratio 70 %   FEV1FVC-%Change-Post -13 %   Pre FEV6/FVC Ratio 100 %   FEV6FVC-%Pred-Pre 104 %   Post FEV6/FVC ratio 100 %   FEV6FVC-%Pred-Post 104 %   FEF 25-75 Pre 0.83 L/sec   FEF2575-%Pred-Pre 38 %   FEF 25-75 Post 0.83 L/sec   FEF2575-%Pred-Post 38 %   FEF2575-%Change-Post -1 %   RV 2.19 L   RV % pred 98 %   TLC 3.75 L   TLC % pred 70 %   DLCO unc 8.99 ml/min/mmHg   DLCO unc % pred 42 %   DL/VA 4.09 ml/min/mmHg/L  DL/VA % pred 61 %  Consistent with obstructive airways disease severity which may be underestimated due to concomitant restriction. Absence of interstitial findings on imaging suspect restrictive physiology is due to obesity.  Likely has obesity with alveolar hypoventilation.   Ambulatory oximetry was performed today: At rest oxygen saturation was 85%.  Patient required 3 L/min to maintain oxygen saturations at 90% or better during ambulation.  Was able to complete 3 laps around the office with supplemental oxygen.    Assessment & Plan:     ICD-10-CM   1. Stage 2 moderate COPD by GOLD classification (HCC)  J44.9 AMB REFERRAL FOR DME   Trial of Trelegy Ellipta 100 As needed albuterol Stop smoking    2. Chronic respiratory failure with hypoxia (HCC)  J96.11 AMB REFERRAL FOR DME   Query PFO, check echocardiogram Will need 3 L O2 during exertion Continue nocturnal oxygen supplementation at 2 LPM    3. Chronic diastolic heart failure (HCC)  Z61.09 ECHOCARDIOGRAM LIMITED BUBBLE STUDY   This issue adds complexity to her management Check echocardiogram    4. Class 2 obesity with alveolar hypoventilation, serious comorbidity, and body mass index (BMI) of 38.0 to 38.9 in adult (HCC)  E66.2 AMB REFERRAL FOR DME   Z68.38    This issue adds complexity to her management She would benefit from weight loss    5. Anemia of chronic kidney failure, stage 4 (severe) (HCC)  N18.4    D63.1    Issue adds complexity to her  management Adds to the hypoxia    6. Dyspnea and respiratory abnormalities  R06.00 ECHOCARDIOGRAM LIMITED BUBBLE STUDY   R06.89    Multifactorial COPD/obesity with alveolar hypoventilation/deconditioning Cardiac (diastolic dysfunction)     Orders Placed This Encounter  Procedures   AMB REFERRAL FOR DME    Referral Priority:   Routine    Referral Type:   Durable Medical Equipment Purchase    Number of Visits Requested:   1   ECHOCARDIOGRAM LIMITED BUBBLE STUDY    Standing Status:   Future    Number of Occurrences:   1    Standing Expiration Date:   01/30/2023    Order Specific Question:   Where should this test be performed    Answer:   [facility]    Order Specific Question:   Perflutren DEFINITY (image enhancing agent) should be administered unless hypersensitivity or allergy exist    Answer:   Administer Perflutren    Order Specific Question:   Reason for exam    Answer:   PFO (patent formamen ovale) 745.5 / Q21.1   Meds ordered this encounter  Medications   Fluticasone-Umeclidin-Vilant (TRELEGY ELLIPTA) 100-62.5-25 MCG/ACT AEPB    Sig: Inhale 1 puff into the lungs daily.    Dispense:  28 each    Refill:  11   Fluticasone-Umeclidin-Vilant (TRELEGY ELLIPTA) 100-62.5-25 MCG/ACT AEPB    Sig: Inhale 1 puff into the lungs daily.    Dispense:  14 each    Refill:  0    Order Specific Question:   Lot Number?    Answer:   [id number]    Order Specific Question:   Expiration Date?    Answer:   05/10/2023    Order Specific Question:   Quantity    Answer:   1    Will see the patient in follow-up in 3 months time she is to contact us prior to that time should any new problems arise.  Visit time 50 minutes.  Gailen Shelter, MD Advanced Bronchoscopy [facility]    *This note was dictated using voice recognition software/Dragon.  Despite best efforts to proofread, errors can occur which can change the meaning. Any transcriptional errors that result  from this process are unintentional and may not be fully corrected at the time of dictation.

## 2022-02-02 ENCOUNTER — Telehealth: Payer: Self-pay | Admitting: Oncology

## 2022-02-02 NOTE — Telephone Encounter (Signed)
:  Left Vm with patient and requesting she call back so that we can schedule her for Venofer x 5 per Dr. Janese Banks.

## 2022-02-02 NOTE — Progress Notes (Unsigned)
Patient ID: Jocelyn Wells, female    DOB: December 31, 1955, 66 y.o.   MRN: [id number]  HPI  Jocelyn Wells is a 66 y/o female with a history of DM, HTN, CKD, stroke, anemia, anxiety, COPD, depression, leukocytosis, tobacco use and chronic heart failure.   Echo report from 09/29/21 reviewed and showed an EF of 45-50% along with moderate LVH & mild LAE.   RHC/LHC done 03/25/15 and showed: Severe left ventricular systolic dysfunction with EF 25-30% Elevated pulmonary Order wedge pressure and upper normal to mildly elevated LVEDP  Normal coronary arteries.  Severe systolic left ventricular dysfunction with EF 25-30%. Mildly elevated LVEDP and moderate pulmonary capillary wedge elevation.  Admitted 01/01/22 due to fatigue and dyspnea, also increasing bilateral lower extremity swelling. On presentation she was requiring 2 L of oxygen to maintain oxygen saturation.  Chest x-ray showed cardiomegaly, mild vascular congestion, bibasilar edema. Needed blood transfusion due to anemia. Initially given IV lasix. Transitioned to oral diuretics. Wound consult done. Discharged after 3 days. Was in the ED 12/07/21 due to abnormal labs from PCP office but LWBS.   She presents today for a follow-up visit with a chief complaint of moderate fatigue with minimal exertion. Describes this as chronic in nature. She has associated visual difficulties, shortness of breath, wheezing, pedal edema (much improved), dizziness, chronic pain and weight loss along with this. She denies any difficulty sleeping, abdominal distention, palpitations, chest pain or cough.   Feels better since her torsemide was increased & entresto started by Dr. Haroldine Laws. Getting both lower legs wrapped in UNNA boots by home health nurse.   Supposed to be wearing oxygen around the clock but she currently only wears it at bedtime as she's waiting on a portable oxygen tank.   Past Medical History:  Diagnosis Date   Anemia    Anxiety    Cardiomyopathy [facility])     new to her Jan 2017   CHF (congestive heart failure) (HCC)    Chronic kidney disease    COPD (chronic obstructive pulmonary disease) (HCC)    Depression    Diabetes mellitus, type II ([place])    HTN (hypertension)    Leukocytosis    Obesity    Osteoporosis    PONV (postoperative nausea and vomiting)    Stroke [facility])    Jan 2017   Past Surgical History:  Procedure Laterality Date   ABDOMINAL HYSTERECTOMY     ANKLE FRACTURE SURGERY Left 02/09/2000   BILATERAL SALPINGOOPHORECTOMY  02/08/1998   CARDIAC CATHETERIZATION N/A 03/25/2015   Procedure: Right/Left Heart Cath and Coronary Angiography;  Surgeon: Belva Crome, MD;  Location: [place] CV LAB;  Service: Cardiovascular;  Laterality: N/A;   CESAREAN SECTION  02/09/1976   COLONOSCOPY WITH PROPOFOL N/A 09/22/2016   Procedure: COLONOSCOPY WITH PROPOFOL;  Surgeon: Manya Silvas, MD;  Location: [facility] ENDOSCOPY;  Service: Endoscopy;  Laterality: N/A;   EP IMPLANTABLE DEVICE N/A 08/05/2015   Procedure: Loop Recorder Insertion;  Surgeon: Deboraha Sprang, MD;  Location: [place] [facility];  Service: Cardiovascular;  Laterality: N/A;   ESOPHAGOGASTRODUODENOSCOPY (EGD) WITH PROPOFOL N/A 09/22/2016   Procedure: ESOPHAGOGASTRODUODENOSCOPY (EGD) WITH PROPOFOL;  Surgeon: Manya Silvas, MD;  Location: [facility] ENDOSCOPY;  Service: Endoscopy;  Laterality: N/A;   ESOPHAGOGASTRODUODENOSCOPY (EGD) WITH PROPOFOL N/A 12/08/2016   Procedure: ESOPHAGOGASTRODUODENOSCOPY (EGD) WITH PROPOFOL;  Surgeon: Manya Silvas, MD;  Location: [facility] ENDOSCOPY;  Service: Endoscopy;  Laterality: N/A;   FRACTURE SURGERY     KNEE ARTHROSCOPY Left 02/09/2003  LOWER EXTREMITY ANGIOGRAPHY Left 08/12/2021   Procedure: Lower Extremity Angiography;  Surgeon: Katha Cabal, MD;  Location: [place] [facility];  Service: Cardiovascular;  Laterality: Left;   TEE WITHOUT CARDIOVERSION N/A 01/31/2015   Procedure: TRANSESOPHAGEAL ECHOCARDIOGRAM (TEE);  Surgeon: Lelon Perla, MD;  Location: [facility] ENDOSCOPY;  Service: Cardiovascular;  Laterality: N/A;   TEMPORARY DIALYSIS CATHETER N/A 08/14/2021   Procedure: TEMPORARY DIALYSIS CATHETER;  Surgeon: Katha Cabal, MD;  Location: [place] [facility];  Service: Cardiovascular;  Laterality: N/A;   TUBAL LIGATION  02/09/1976   ULNAR NERVE TRANSPOSITION  02/08/2006   Family History  Problem Relation Age of Onset   Heart disease Mother        died from CHF   Asthma Mother    Diabetes Mother    Heart disease Father    Aneurysm Father    COPD Brother    Diabetes Brother    Alcohol abuse Paternal Aunt    Cancer Maternal Grandmother        unknownorigin   Anemia Neg Hx    Arrhythmia Neg Hx    Clotting disorder Neg Hx    Fainting Neg Hx    Heart attack Neg Hx    Heart failure Neg Hx    Hyperlipidemia Neg Hx    Hypertension Neg Hx    Social History   Tobacco Use   Smoking status: Some Days    Packs/day: 0.50    Years: 47.00    Total pack years: 23.50    Types: Cigarettes    Start date: 02/08/1974   Smokeless tobacco: Never   Tobacco comments:    Quit 01/18/2022  Substance Use Topics   Alcohol use: No    Alcohol/week: 0.0 standard drinks of alcohol   Allergies  Allergen Reactions   Codeine Nausea And Vomiting   Ivp Dye [Iodinated Contrast Media]     Patients states the IV Dye shuts her kidneys down   Prior to Admission medications   Medication Sig Start Date End Date Taking? Authorizing Provider  albuterol (VENTOLIN HFA) 108 (90 Base) MCG/ACT inhaler TAKE 2 PUFFS BY MOUTH EVERY 6 HOURS AS NEEDED FOR WHEEZE OR SHORTNESS OF BREATH Patient taking differently: Inhale 2 puffs into the lungs every 6 (six) hours as needed for wheezing or shortness of breath. 06/19/21  Yes Gwyneth Sprout, FNP  apixaban (ELIQUIS) 5 MG TABS tablet Take 1 tablet (5 mg total) by mouth 2 (two) times daily. 12/16/20  Yes Schnier, Dolores Lory, MD  atorvastatin (LIPITOR) 40 MG tablet Take 1 tablet (40 mg total) by mouth  daily. 09/08/21  Yes Gwyneth Sprout, FNP  buPROPion (WELLBUTRIN SR) 150 MG 12 hr tablet Take 1 tablet (150 mg total) by mouth 2 (two) times daily. 01/18/22  Yes Gwyneth Sprout, FNP  carvedilol (COREG) 25 MG tablet Take 1 tablet (25 mg total) by mouth 2 (two) times daily with a meal. 01/04/22  Yes Shelly Coss, MD  clopidogrel (PLAVIX) 75 MG tablet Take 1 tablet (75 mg total) by mouth daily. 10/23/21  Yes Hammock, Barbera Setters, NP  Dulaglutide 3 MG/0.5ML SOPN Inject into the skin once a week. 04/17/21  Yes [provider]  Fluticasone-Umeclidin-Vilant (TRELEGY ELLIPTA) 100-62.5-25 MCG/ACT AEPB Inhale 1 puff into the lungs daily. 01/29/22  Yes Tyler Pita, MD  gabapentin (NEURONTIN) 300 MG capsule Take 300 mg by mouth 2 (two) times daily.   Yes [provider]  glucose blood test strip OneTouch Verio strips  Yes [provider]  insulin aspart (NOVOLOG) 100 UNIT/ML injection Insulin pump 05/26/20  Yes [provider]  Insulin Disposable Pump (OMNIPOD DASH PODS, GEN 4,) MISC USE 1 POD EACH EVERY 72    HOURS 10/29/20  Yes [provider]  Insulin Human (INSULIN PUMP) SOLN Per endocrinoloy 06/01/20  Yes Elgergawy, Silver Huguenin, MD  loratadine (CLARITIN) 10 MG tablet Take 1 tablet by mouth daily.   Yes [provider]  meclizine (ANTIVERT) 12.5 MG tablet Take 1 tablet (12.5 mg total) by mouth 3 (three) times daily as needed for dizziness. 11/23/21  Yes Myles Gip, DO  pantoprazole (PROTONIX) 40 MG tablet Take 1 tablet (40 mg total) by mouth 2 (two) times daily. 08/23/21  Yes Sharen Hones, MD  PARoxetine (PAXIL) 40 MG tablet Take 1 tablet (40 mg total) by mouth daily. 10/12/21 04/10/22 Yes Hisada, Elie Goody, MD  sacubitril-valsartan (ENTRESTO) 49-51 MG Take 1 tablet by mouth 2 (two) times daily. 01/26/22  Yes Bensimhon, Shaune Pascal, MD  Spacer/Aero-Holding [employer] DIAMOND) MISC Use with inhaler to ensure medication is delivered throughout lungs 02/13/21   Yes Gwyneth Sprout, FNP  traMADol (ULTRAM) 50 MG tablet Take 1 tablet (50 mg total) by mouth every 12 (twelve) hours as needed. 11/03/21 03/03/22 Yes Gillis Santa, MD  Vitamin D, Ergocalciferol, (DRISDOL) 1.25 MG (50000 UNIT) CAPS capsule Take 50,000 Units by mouth every 7 (seven) days. 01/24/22  Yes [provider]  fluticasone (FLONASE) 50 MCG/ACT nasal spray Place 2 sprays into both nostrils daily. 02/13/21   Gwyneth Sprout, FNP  Fluticasone-Umeclidin-Vilant (TRELEGY ELLIPTA) 100-62.5-25 MCG/ACT AEPB Inhale 1 puff into the lungs daily. Patient not taking: Reported on 02/03/2022 01/29/22   Tyler Pita, MD  torsemide (DEMADEX) 20 MG tablet Take 3 tablets (60 mg total) by mouth daily. 02/03/22   Alisa Graff, FNP   Review of Systems  Constitutional:  Positive for fatigue (easily). Negative for appetite change.  HENT:  Negative for congestion, postnasal drip and sore throat.   Eyes:  Positive for visual disturbance (blurry vision).  Respiratory:  Positive for shortness of breath and wheezing (at times). Negative for cough and chest tightness.   Cardiovascular:  Positive for leg swelling. Negative for chest pain and palpitations.  Gastrointestinal:  Negative for abdominal distention and abdominal pain.  Endocrine: Negative.   Genitourinary: Negative.   Musculoskeletal:  Positive for arthralgias (knees) and back pain.  Skin:  Positive for wound (right leg).  Allergic/Immunologic: Negative.   Neurological:  Positive for dizziness and light-headedness.  Hematological:  Negative for adenopathy. Does not bruise/bleed easily.  Psychiatric/Behavioral:  Positive for dysphoric mood. Negative for sleep disturbance (sleeping on 1 pillow with oxygen at 2L). The patient is not nervous/anxious.    Vitals:   02/03/22 1103  BP: (!) 127/56  Pulse: 71  Resp: 16  SpO2: 91%  Weight: 223 lb (101.2 kg)   Wt Readings from Last 3 Encounters:  02/03/22 223 lb (101.2 kg)  01/29/22 238 lb (108  kg)  01/28/22 236 lb 9.6 oz (107.3 kg)   Lab Results  Component Value Date   CREATININE 3.41 (H) 02/03/2022   CREATININE 2.75 (H) 01/26/2022   CREATININE 2.96 (H) 01/04/2022   Physical Exam Vitals and nursing note reviewed. Exam conducted with a chaperone present (son).  Constitutional:      Appearance: She is ill-appearing.  HENT:     Head: Normocephalic and atraumatic.  Eyes:     Comments: Eyelids are puffy  Cardiovascular:     Rate and Rhythm: Normal rate and regular rhythm.  Pulmonary:     Effort: Pulmonary effort is normal. No respiratory distress.     Breath sounds: No wheezing or rales.  Abdominal:     General: There is no distension.     Palpations: Abdomen is soft.     Tenderness: There is no abdominal tenderness.  Musculoskeletal:        General: No tenderness.     Cervical back: Normal range of motion and neck supple.     Right lower leg: No tenderness. Edema (wrapped in [place] boot) present.     Left lower leg: No tenderness. Edema (wrapped in [place] boot) present.  Skin:    General: Skin is warm and dry.  Neurological:     General: No focal deficit present.     Mental Status: She is alert and oriented to person, place, and time.  Psychiatric:        Mood and Affect: Mood normal.        Behavior: Behavior normal.   Assessment & Plan:  1: Chronic heart failure with mildly reduced ejection fraction- - NYHA class III - euvolemic today - weighing daily; reminded to call for an overnight weight gain of > 2 pounds or a weekly weight gain of > 5 pounds - weight down 17 pounds from last visit here 3 weeks ago; down 18.8 pounds from [facility] provider visit 8 days ago - not adding salt to her food - on GDMT of carvedilol & entresto - due to renal function, not a candidate for spironolactone or SGLT2 - saw cardiology Tera Mater) 10/23/21 - saw [facility] provider (Bensimhon) 01/26/22 & had torsemide dose increased and entresto added - will check BMP today - currently has lower  legs wrapped in [place] boots and being changed by home health nurse - BNP 01/26/22 was 2195.7 - participating in [facility] program - has received her flu vaccine for this season  2: HTN with CKD- - BP looks good (127/56) - saw PCP Rollene Rotunda) 01/18/22 - BMP 01/26/22 reviewed and showed sodium 145, potassium 4.3, creatinine 2.75 & GFR 18 - saw nephrology Lanora Manis) 12/17/21; returns early Jan 2024  3: COPD- - saw pulmonology Patsey Berthold) 01/29/22 - wears oxygen at 2L at bedtime - waiting on portable tank so that she can wear oxygen 24/7  4: Anemia- - had video visit with hematology Janese Banks) 12/29/21 - hemoglobin 01/04/22 was 8.6 - has colonoscopy/ EGD scheduled for 02/2022 - received iron infusion 01/15/22  5: Uncontrolled DM- - saw endocrinology (Solum) 12/24/21 - A1c 12/16/21 was 10.1%   Patient did not bring her medications nor a list. Each medication was verbally reviewed with the patient and she was encouraged to bring the bottles to every visit to confirm accuracy of list.  Return in 3 weeks, sooner if needed.

## 2022-02-03 ENCOUNTER — Encounter: Payer: Self-pay | Admitting: Family

## 2022-02-03 ENCOUNTER — Ambulatory Visit: Payer: Medicare Other | Attending: Family | Admitting: Family

## 2022-02-03 ENCOUNTER — Ambulatory Visit (INDEPENDENT_AMBULATORY_CARE_PROVIDER_SITE_OTHER): Payer: Medicare Other

## 2022-02-03 ENCOUNTER — Telehealth: Payer: Self-pay | Admitting: Family

## 2022-02-03 VITALS — BP 127/56 | HR 71 | Resp 16 | Wt 223.0 lb

## 2022-02-03 DIAGNOSIS — N189 Chronic kidney disease, unspecified: Secondary | ICD-10-CM | POA: Insufficient documentation

## 2022-02-03 DIAGNOSIS — D631 Anemia in chronic kidney disease: Secondary | ICD-10-CM

## 2022-02-03 DIAGNOSIS — E1122 Type 2 diabetes mellitus with diabetic chronic kidney disease: Secondary | ICD-10-CM | POA: Insufficient documentation

## 2022-02-03 DIAGNOSIS — D649 Anemia, unspecified: Secondary | ICD-10-CM | POA: Diagnosis not present

## 2022-02-03 DIAGNOSIS — I5022 Chronic systolic (congestive) heart failure: Secondary | ICD-10-CM | POA: Diagnosis not present

## 2022-02-03 DIAGNOSIS — G8929 Other chronic pain: Secondary | ICD-10-CM | POA: Diagnosis not present

## 2022-02-03 DIAGNOSIS — E1165 Type 2 diabetes mellitus with hyperglycemia: Secondary | ICD-10-CM | POA: Insufficient documentation

## 2022-02-03 DIAGNOSIS — Z8673 Personal history of transient ischemic attack (TIA), and cerebral infarction without residual deficits: Secondary | ICD-10-CM | POA: Insufficient documentation

## 2022-02-03 DIAGNOSIS — I130 Hypertensive heart and chronic kidney disease with heart failure and stage 1 through stage 4 chronic kidney disease, or unspecified chronic kidney disease: Secondary | ICD-10-CM | POA: Insufficient documentation

## 2022-02-03 DIAGNOSIS — E1129 Type 2 diabetes mellitus with other diabetic kidney complication: Secondary | ICD-10-CM

## 2022-02-03 DIAGNOSIS — I152 Hypertension secondary to endocrine disorders: Secondary | ICD-10-CM | POA: Diagnosis not present

## 2022-02-03 DIAGNOSIS — Z794 Long term (current) use of insulin: Secondary | ICD-10-CM | POA: Diagnosis not present

## 2022-02-03 DIAGNOSIS — I10 Essential (primary) hypertension: Secondary | ICD-10-CM | POA: Diagnosis not present

## 2022-02-03 DIAGNOSIS — J449 Chronic obstructive pulmonary disease, unspecified: Secondary | ICD-10-CM | POA: Diagnosis not present

## 2022-02-03 DIAGNOSIS — E1159 Type 2 diabetes mellitus with other circulatory complications: Secondary | ICD-10-CM

## 2022-02-03 DIAGNOSIS — I7025 Atherosclerosis of native arteries of other extremities with ulceration: Secondary | ICD-10-CM | POA: Diagnosis not present

## 2022-02-03 DIAGNOSIS — Z7901 Long term (current) use of anticoagulants: Secondary | ICD-10-CM | POA: Diagnosis not present

## 2022-02-03 DIAGNOSIS — I5033 Acute on chronic diastolic (congestive) heart failure: Secondary | ICD-10-CM | POA: Diagnosis not present

## 2022-02-03 DIAGNOSIS — Z79899 Other long term (current) drug therapy: Secondary | ICD-10-CM | POA: Insufficient documentation

## 2022-02-03 DIAGNOSIS — F17210 Nicotine dependence, cigarettes, uncomplicated: Secondary | ICD-10-CM | POA: Insufficient documentation

## 2022-02-03 DIAGNOSIS — N184 Chronic kidney disease, stage 4 (severe): Secondary | ICD-10-CM

## 2022-02-03 LAB — BASIC METABOLIC PANEL
Anion gap: 11 (ref 5–15)
BUN: 58 mg/dL — ABNORMAL HIGH (ref 8–23)
CO2: 30 mmol/L (ref 22–32)
Calcium: 8 mg/dL — ABNORMAL LOW (ref 8.9–10.3)
Chloride: 102 mmol/L (ref 98–111)
Creatinine, Ser: 3.41 mg/dL — ABNORMAL HIGH (ref 0.44–1.00)
GFR, Estimated: 14 mL/min — ABNORMAL LOW (ref >60)
Glucose, Bld: 257 mg/dL — ABNORMAL HIGH (ref 70–99)
Potassium: 4.1 mmol/L (ref 3.5–5.1)
Sodium: 143 mmol/L (ref 135–145)

## 2022-02-03 MED ORDER — TORSEMIDE 20 MG PO TABS: 40.0000 mg | ORAL_TABLET | Freq: Every day | ORAL | 3 refills | Status: DC

## 2022-02-03 NOTE — Chronic Care Management (AMB) (Signed)
Chronic Care Management   CCM RN Visit Note  02/03/2022 Name: Jocelyn Wells MRN: [id number] DOB: 11-Nov-1955  Subjective: Jocelyn Wells is a 65 y.o. year old female who is a primary care patient of Gwyneth Sprout, FNP. The patient was referred to the Chronic Care Management team for assistance with care management needs subsequent to provider initiation of CCM services and plan of care.    Today's Visit:  Engaged with patient by telephone for follow up visit.       Goals Addressed             This Visit's Progress    Goal: CCM (CHF) Expected Outcome: Monitor, Self-Manage and Reduce Symptoms of Congestive Heart Failure       Current Barriers:  Chronic Disease Management support and education needs related to CHF  Planned Interventions: Reviewed plan for CHF management. Reports compliance with medications. Completed follow up with the Heart Failure clinic earlier today. Medication changes include start of Entresto. Torsemide has been increased to '40mg'$ . Reviewed weight parameters. Reports weighing daily. Reports receiving scale for virtual weight monitoring. Reports readings are being electronically submitted to [facility].  Reviewed s/sx of fluid overload and indications for notifying a provider. Denies abdominal or lower extremity edema. Continues to experience shortness of breath with exertion. Currently requires supplemental oxygen. Using 2-3 L/min. Reports all oxygen saturations have ranged >90%. Pending evaluation for portable oxygen eligibility. Agency plans to complete this test in the home. Reviewed worsening s/sx related to CHF exacerbation and indications for seeking immediate medical attention.   Wt Readings from Last 3 Encounters:  02/03/22 223 lb (101.2 kg)  01/29/22 238 lb (108 kg)  01/28/22 236 lb 9.6 oz (107.3 kg)    Wt Readings from Last 3 Encounters:  12/16/21 248 lb 4 oz (112.6 kg)  12/15/21 245 lb 9.6 oz (111.4 kg)  12/09/21 243 lb 9.6 oz (110.5 kg)      Symptom Management: Take medications as prescribed   Attend all scheduled provider appointments Call pharmacy for medication refills 3-7 days in advance of running out of medications Call provider office for new concerns or questions  Call office for weight gain more than 2 pounds in one day or 5 pounds in one week Weigh myself daily Track weight in diary Watch for swelling in feet, ankles and legs every day Keep legs up while sitting Track symptoms  Seek immediate medical attention for worsening symptoms   Follow Up Plan:  Will follow up next month       Goal: CCM (Diabetes) Expected Outcome: Monitor, Self-Manage and Reduce Symptoms of Diabetes       Current Barriers:  Chronic Disease Management support and education needs related to Diabetes Management  Planned Interventions: Reviewed plan for diabetes management. Reports compliance with medications. Reports insulin is being administered via Omnipod. Reports using FreeStyle Libre for continuous glucose monitoring. Diabetes is not currently controlled.  Reports recent fasting readings have been within range. Denies recent hypoglycemic or hyperglycemic episodes. Reports attempting to adhere to nutritional recommendations. Advised to increase intake of fruits, vegetables, fiber, and lean proteins. Advised to closely monitor carbs and avoid foods with added sugars when possible. Reviewed preventive exams. Eye and foot exams are up to date. Denies changes or open areas to skin. Currently requiring bilateral Unna boot for edema. Reports current Home Health plan is to change wraps twice a week. Will continue follow up with the Vascular team. Will follow up with the Nephrology team on  02/10/22.   Lab Results  Component Value Date   HGBA1C 10.2 (H) 11/23/2021    Symptom Management: Take all medications as prescribed Attend all scheduled provider appointments Call pharmacy for medication refills 3-7 days in advance of running out of  medications Call provider office for new concerns or questions  Keep appointment with eye doctor Continue monitoring CGM device  Keep appointments for biweekly Unna boot change Monitor carbohydrate intake and avoid foods with added sugars when possible  Follow Up Plan:  Will follow up next month       Goal: CCM (HTN) Expected Outcome: Monitor, Self-Manage and Reduce Symptoms of Hypertension       Current Barriers:  Chronic Disease Management support and education needs related to Hypertension  Planned Interventions: Reviewed plan for hypertension management. Remains compliant with medications and treatment plan. Reviewed blood pressure parameters. Reports currently having electronic monitoring device for BP and EKG. Reports readings are being monitored by [facility]. Ability to engage in activity remains limited d/t chronic joint pain. Reviewed importance of complying with recommended cardiac prudent diet. Encouraged to continue reading nutrition labels, monitor sodium intake and avoid highly processed foods when possible. Encouraged to keep the care management team updated of care management needs.  Reviewed pending appointments. Pending follow up with the Cardiology team in January. Reviewed s/sx of heart attack, stroke and worsening symptoms that require immediate medical attention.  BP Readings from Last 3 Encounters:  02/03/22 (!) 127/56  01/29/22 138/78  01/28/22 130/67    Symptom Management: Take all medications as prescribed Attend all scheduled provider appointments Call pharmacy for medication refills 3-7 days in advance of running out of medications Call provider office for new concerns or questions  Check blood pressure daily Keep a blood pressure log Call doctor for signs and symptoms of high blood pressure Report new symptoms to your doctor  Follow Up Plan:   Will follow up next month.           PLAN: Will follow up next month   [place] Manager/Chronic Care Management [phone]

## 2022-02-03 NOTE — Patient Instructions (Signed)
Continue weighing daily and call for an overnight weight gain of 3 pounds or more or a weekly weight gain of more than 5 pounds.   If you have voicemail, please make sure your mailbox is cleaned out so that we may leave a message and please make sure to listen to any voicemails.     

## 2022-02-03 NOTE — Telephone Encounter (Signed)
Spoke with patient regarding BMP results obtained earlier today. Glucose elevated at 257 and she says that she ate sausage wrapped in crescent rolls for breakfast. Kidney function has worsened since diuretic increased and entresto started. Advised patient to decrease her torsemide to '40mg'$  daily. She sees cardiology next week so will message them and ask to recheck these labs.   She verbalized understanding.

## 2022-02-04 ENCOUNTER — Encounter: Payer: Medicare Other | Admitting: Family

## 2022-02-04 ENCOUNTER — Other Ambulatory Visit
Admission: RE | Admit: 2022-02-04 | Discharge: 2022-02-04 | Disposition: A | Discharge status: Home / Self Care | Payer: Medicare Other | Source: Ambulatory Visit | Attending: Family | Admitting: Family

## 2022-02-04 DIAGNOSIS — D631 Anemia in chronic kidney disease: Secondary | ICD-10-CM | POA: Diagnosis not present

## 2022-02-04 DIAGNOSIS — I152 Hypertension secondary to endocrine disorders: Secondary | ICD-10-CM | POA: Diagnosis not present

## 2022-02-04 DIAGNOSIS — I5033 Acute on chronic diastolic (congestive) heart failure: Secondary | ICD-10-CM | POA: Diagnosis not present

## 2022-02-04 DIAGNOSIS — I5022 Chronic systolic (congestive) heart failure: Secondary | ICD-10-CM | POA: Insufficient documentation

## 2022-02-04 DIAGNOSIS — E1122 Type 2 diabetes mellitus with diabetic chronic kidney disease: Secondary | ICD-10-CM | POA: Diagnosis not present

## 2022-02-04 DIAGNOSIS — N184 Chronic kidney disease, stage 4 (severe): Secondary | ICD-10-CM | POA: Diagnosis not present

## 2022-02-04 DIAGNOSIS — E1159 Type 2 diabetes mellitus with other circulatory complications: Secondary | ICD-10-CM | POA: Diagnosis not present

## 2022-02-05 DIAGNOSIS — D631 Anemia in chronic kidney disease: Secondary | ICD-10-CM | POA: Diagnosis not present

## 2022-02-05 DIAGNOSIS — I152 Hypertension secondary to endocrine disorders: Secondary | ICD-10-CM | POA: Diagnosis not present

## 2022-02-05 DIAGNOSIS — E1122 Type 2 diabetes mellitus with diabetic chronic kidney disease: Secondary | ICD-10-CM | POA: Diagnosis not present

## 2022-02-05 DIAGNOSIS — I5033 Acute on chronic diastolic (congestive) heart failure: Secondary | ICD-10-CM | POA: Diagnosis not present

## 2022-02-05 DIAGNOSIS — N184 Chronic kidney disease, stage 4 (severe): Secondary | ICD-10-CM | POA: Diagnosis not present

## 2022-02-05 DIAGNOSIS — E1159 Type 2 diabetes mellitus with other circulatory complications: Secondary | ICD-10-CM | POA: Diagnosis not present

## 2022-02-07 ENCOUNTER — Other Ambulatory Visit (INDEPENDENT_AMBULATORY_CARE_PROVIDER_SITE_OTHER): Payer: Self-pay | Admitting: Vascular Surgery

## 2022-02-07 DIAGNOSIS — I5020 Unspecified systolic (congestive) heart failure: Secondary | ICD-10-CM | POA: Diagnosis not present

## 2022-02-07 DIAGNOSIS — I110 Hypertensive heart disease with heart failure: Secondary | ICD-10-CM

## 2022-02-09 DIAGNOSIS — E1159 Type 2 diabetes mellitus with other circulatory complications: Secondary | ICD-10-CM | POA: Diagnosis not present

## 2022-02-09 DIAGNOSIS — E1122 Type 2 diabetes mellitus with diabetic chronic kidney disease: Secondary | ICD-10-CM | POA: Diagnosis not present

## 2022-02-09 DIAGNOSIS — I5033 Acute on chronic diastolic (congestive) heart failure: Secondary | ICD-10-CM | POA: Diagnosis not present

## 2022-02-09 DIAGNOSIS — I152 Hypertension secondary to endocrine disorders: Secondary | ICD-10-CM | POA: Diagnosis not present

## 2022-02-09 DIAGNOSIS — D631 Anemia in chronic kidney disease: Secondary | ICD-10-CM | POA: Diagnosis not present

## 2022-02-09 DIAGNOSIS — N184 Chronic kidney disease, stage 4 (severe): Secondary | ICD-10-CM | POA: Diagnosis not present

## 2022-02-10 ENCOUNTER — Other Ambulatory Visit: Payer: Self-pay | Admitting: Family

## 2022-02-10 DIAGNOSIS — E1122 Type 2 diabetes mellitus with diabetic chronic kidney disease: Secondary | ICD-10-CM | POA: Diagnosis not present

## 2022-02-10 DIAGNOSIS — E875 Hyperkalemia: Secondary | ICD-10-CM | POA: Diagnosis not present

## 2022-02-10 DIAGNOSIS — N185 Chronic kidney disease, stage 5: Secondary | ICD-10-CM | POA: Diagnosis not present

## 2022-02-10 DIAGNOSIS — I10 Essential (primary) hypertension: Secondary | ICD-10-CM | POA: Diagnosis not present

## 2022-02-10 MED ORDER — TORSEMIDE 20 MG PO TABS: 20.0000 mg | ORAL_TABLET | Freq: Every day | ORAL | 3 refills | Status: DC

## 2022-02-10 NOTE — Progress Notes (Signed)
Torsemide decreased to '20mg'$  daily per Ventricle HF provider Abigail Butts)

## 2022-02-10 NOTE — Progress Notes (Unsigned)
Cardiology Clinic Note   Patient Name: Jocelyn Wells Date of Encounter: 02/11/2022  Primary Care Provider:  Gwyneth Sprout, FNP Primary Cardiologist:  None  Patient Profile    67 year old female with a history of anxiety, congestive heart failure, paroxysmal atrial fibrillation, hypertension, diabetes, former smoker, COPD, CVA, DVT, CKD, lumbar spinal stenosis causing severe back pain, who is here today to follow-up.   Past Medical History    Past Medical History:  Diagnosis Date   Anemia    Anxiety    Cardiomyopathy ([place])    new to her Jan 2017   CHF (congestive heart failure) (HCC)    Chronic kidney disease    COPD (chronic obstructive pulmonary disease) (HCC)    Depression    Diabetes mellitus, type II ([place])    HTN (hypertension)    Leukocytosis    Obesity    Osteoporosis    PONV (postoperative nausea and vomiting)    Stroke [facility])    Jan 2017   Past Surgical History:  Procedure Laterality Date   ABDOMINAL HYSTERECTOMY     ANKLE FRACTURE SURGERY Left 02/09/2000   BILATERAL SALPINGOOPHORECTOMY  02/08/1998   CARDIAC CATHETERIZATION N/A 03/25/2015   Procedure: Right/Left Heart Cath and Coronary Angiography;  Surgeon: Belva Crome, MD;  Location: [place] [facility];  Service: Cardiovascular;  Laterality: N/A;   CESAREAN SECTION  02/09/1976   COLONOSCOPY WITH PROPOFOL N/A 09/22/2016   Procedure: COLONOSCOPY WITH PROPOFOL;  Surgeon: Manya Silvas, MD;  Location: [facility] ENDOSCOPY;  Service: Endoscopy;  Laterality: N/A;   EP IMPLANTABLE DEVICE N/A 08/05/2015   Procedure: Loop Recorder Insertion;  Surgeon: Deboraha Sprang, MD;  Location: [place] [facility];  Service: Cardiovascular;  Laterality: N/A;   ESOPHAGOGASTRODUODENOSCOPY (EGD) WITH PROPOFOL N/A 09/22/2016   Procedure: ESOPHAGOGASTRODUODENOSCOPY (EGD) WITH PROPOFOL;  Surgeon: Manya Silvas, MD;  Location: [facility] ENDOSCOPY;  Service: Endoscopy;  Laterality: N/A;   ESOPHAGOGASTRODUODENOSCOPY (EGD) WITH  PROPOFOL N/A 12/08/2016   Procedure: ESOPHAGOGASTRODUODENOSCOPY (EGD) WITH PROPOFOL;  Surgeon: Manya Silvas, MD;  Location: [facility] ENDOSCOPY;  Service: Endoscopy;  Laterality: N/A;   FRACTURE SURGERY     KNEE ARTHROSCOPY Left 02/09/2003   LOWER EXTREMITY ANGIOGRAPHY Left 08/12/2021   Procedure: Lower Extremity Angiography;  Surgeon: Katha Cabal, MD;  Location: [place] [facility];  Service: Cardiovascular;  Laterality: Left;   TEE WITHOUT CARDIOVERSION N/A 01/31/2015   Procedure: TRANSESOPHAGEAL ECHOCARDIOGRAM (TEE);  Surgeon: Lelon Perla, MD;  Location: [facility] ENDOSCOPY;  Service: Cardiovascular;  Laterality: N/A;   TEMPORARY DIALYSIS CATHETER N/A 08/14/2021   Procedure: TEMPORARY DIALYSIS CATHETER;  Surgeon: Katha Cabal, MD;  Location: [place] [facility];  Service: Cardiovascular;  Laterality: N/A;   TUBAL LIGATION  02/09/1976   ULNAR NERVE TRANSPOSITION  02/08/2006    Allergies  Allergies  Allergen Reactions   Codeine Nausea And Vomiting   Ivp Dye [Iodinated Contrast Media]     Patients states the IV Dye shuts her kidneys down    History of Present Illness    Kameren A. Altemose is a 67 year old female with a history of anxiety,  congestive heart failure with improved EF 45% in 2017 that normalized to 60-65% 2019, paroxysmal atrial fibrillation on chronic apixaban, hypertension, diabetes, former smoker x 40+ years, COPD, CVA due to thrombosis of the left middle cerebral artery status post ILR in 2017, DVT with continued apixaban, CKD, and lumbar spinal stenosis with back pain.   Loop recorder placed after CVA.  ILR check with no evidence of atrial fibrillation.  Left heart catheterization in 2017 revealed normal coronary arteries.  Echocardiogram in 2017 revealed LVEF of 40-45%, repeat echocardiogram in 2019 revealed EF of 60-65%.  Last echocardiogram revealed LVEF of 45-50%, left ventricle is mildly decreased function, no regional wall motion abnormalities, moderate  left ventricular hypertrophy, G1 DD, aortic valve sclerosis without stenosis.  7/523 she underwent percutaneous transluminal angioplasty and stent placement to the left superficial femoral artery and popliteal to 5 mm approximately 4 mm distally.  Percutaneous transluminal angioplasty of the left common iliac artery, percutaneous transluminal angioplasty of the left peroneal artery and mechanical thrombectomy of the left tibioperoneal trunk and peroneal with*closure of the right common femoral artery anatomy.  Postoperative period was complicated due to worsening renal function which required initiation of dialysis.  Temporary dialysis catheter was placed in the right femoral.  She was able to revert back to stage IV chronic kidney disease with outpatient follow-up with nephrology to determine if she would like to pursue hemodialysis versus peritoneal dialysis she was then subsequently discharged from the facility 08/23/2021.   Was last seen in clinic 10/23/2021 stating that she was doing okay she just returned from a trip.  Home health nurse continues to come to the home and dressed the wounds several times a week and they were concerned that she may have to start going into the wound clinic even after her arterial procedures by vascular the wounds were still not healing.  There was a discussion that if her breathing continue to worsen then she may benefit from a right heart catheterization.  There were no other changes made to her medications or tests ordered.   She was admitted to the hospital 01/01/2022 for shortness of breath.  And home health nurse felt that she did not look well today and instructed her to come in for evaluation.  Initial vitals reveal blood pressure 164/86, pulse of 80, respirations of 18, temperature 98.1.  Pertinent labs were chloride 114, BUN 29, serum creatinine 2.63, with estimated GFR 19, WBCs of 12.9, hemoglobin of 6.7, hematocrit 24.1, BNP of 2508.4, and high-sensitivity troponins  were flat at 18 x 2.  She was admitted for symptomatic anemia transfused with recommendation to keep hemoglobin in range of 8.  She was also diuresed with Lasix.  She was followed by nephrology following her AKI was thought to be near euvolemic status and nephrology cleared her for discharge from the nephrology perspective.  She was also advised that she would need to follow-up with hematology as outpatient as well for her anemia likely a combination of her CKD and iron deficiency anemia.  She was discharged on 01/04/2022.  She does have outpatient colonoscopy and EGD scheduled for 02/2022 she is also continued on iron infusions.   She returns to clinic today stating that overall she has been doing okay.  She states that she has not had a little bit of dizziness and fatigue but that is gradually improving.  She is accompanied by her son today stating that she has been advised that she needs to wear oxygen not only in the evening but throughout the day and she is waiting on her portable oxygen so she can start wearing it when she is out and about.  She states that she also has home health nursing that comes in and she had Unna boots to her bilateral lower extremities that were removed earlier this week.  When the nurse comes back on Friday if  her legs continue to look like they do today that she will eat them unwrapped if she starts with swelling then they will reevaluate her legs at that time.  She denies any chest pain, palpitations, worsening shortness of breath, or changes in weight.  Home Medications    Current Outpatient Medications  Medication Sig Dispense Refill   albuterol (VENTOLIN HFA) 108 (90 Base) MCG/ACT inhaler TAKE 2 PUFFS BY MOUTH EVERY 6 HOURS AS NEEDED FOR WHEEZE OR SHORTNESS OF BREATH (Patient taking differently: Inhale 2 puffs into the lungs every 6 (six) hours as needed for wheezing or shortness of breath.) 8.5 each 2   atorvastatin (LIPITOR) 40 MG tablet Take 1 tablet (40 mg total) by  mouth daily. 90 tablet 3   buPROPion (WELLBUTRIN SR) 150 MG 12 hr tablet Take 1 tablet (150 mg total) by mouth 2 (two) times daily. 180 tablet 1   carvedilol (COREG) 25 MG tablet Take 1 tablet (25 mg total) by mouth 2 (two) times daily with a meal. 60 tablet 0   clopidogrel (PLAVIX) 75 MG tablet Take 1 tablet (75 mg total) by mouth daily. 90 tablet 3   Dulaglutide 3 MG/0.5ML SOPN Inject into the skin once a week.     fluticasone (FLONASE) 50 MCG/ACT nasal spray Place 2 sprays into both nostrils daily. 16 g 6   Fluticasone-Umeclidin-Vilant (TRELEGY ELLIPTA) 100-62.5-25 MCG/ACT AEPB Inhale 1 puff into the lungs daily. 28 each 11   gabapentin (NEURONTIN) 300 MG capsule Take 300 mg by mouth 2 (two) times daily.     glucose blood test strip OneTouch Verio strips     insulin aspart (NOVOLOG) 100 UNIT/ML injection Insulin pump     Insulin Disposable Pump (OMNIPOD DASH PODS, GEN 4,) MISC USE 1 POD EACH EVERY 72    HOURS     Insulin Human (INSULIN PUMP) SOLN Per endocrinoloy     loratadine (CLARITIN) 10 MG tablet Take 1 tablet by mouth daily.     meclizine (ANTIVERT) 12.5 MG tablet Take 1 tablet (12.5 mg total) by mouth 3 (three) times daily as needed for dizziness. 30 tablet 0   OXYGEN Place 2 L into the nose continuous.     pantoprazole (PROTONIX) 40 MG tablet Take 1 tablet (40 mg total) by mouth 2 (two) times daily. 60 tablet 0   PARoxetine (PAXIL) 40 MG tablet Take 1 tablet (40 mg total) by mouth daily. 90 tablet 1   sacubitril-valsartan (ENTRESTO) 49-51 MG Take 1 tablet by mouth 2 (two) times daily. 60 tablet 3   Spacer/Aero-Holding Chambers (OPTICHAMBER DIAMOND) MISC Use with inhaler to ensure medication is delivered throughout lungs 1 each 0   torsemide (DEMADEX) 20 MG tablet Take 1 tablet (20 mg total) by mouth daily. 90 tablet 3   traMADol (ULTRAM) 50 MG tablet Take 1 tablet (50 mg total) by mouth every 12 (twelve) hours as needed. 60 tablet 3   Vitamin D, Ergocalciferol, (DRISDOL) 1.25 MG  (50000 UNIT) CAPS capsule Take 50,000 Units by mouth every 7 (seven) days.     apixaban (ELIQUIS) 5 MG TABS tablet Take 1 tablet (5 mg total) by mouth 2 (two) times daily. 180 tablet 2   Fluticasone-Umeclidin-Vilant (TRELEGY ELLIPTA) 100-62.5-25 MCG/ACT AEPB Inhale 1 puff into the lungs daily. (Patient not taking: Reported on 02/03/2022) 14 each 0   polyethylene glycol-electrolytes (NULYTELY) 420 g solution Take 4,000 mLs by mouth once for 1 dose. 4000 mL 0   No current facility-administered medications for this visit.  Family History    Family History  Problem Relation Age of Onset   Heart disease Mother        died from CHF   Asthma Mother    Diabetes Mother    Heart disease Father    Aneurysm Father    COPD Brother    Diabetes Brother    Alcohol abuse Paternal Aunt    Cancer Maternal Grandmother        unknownorigin   Anemia Neg Hx    Arrhythmia Neg Hx    Clotting disorder Neg Hx    Fainting Neg Hx    Heart attack Neg Hx    Heart failure Neg Hx    Hyperlipidemia Neg Hx    Hypertension Neg Hx    She indicated that her mother is deceased. She indicated that her father is deceased. She indicated that both of her brothers are deceased. She indicated that the status of her maternal grandmother is unknown. She indicated that her maternal grandfather is deceased. She indicated that the status of her paternal aunt is unknown. She indicated that the status of her neg hx is unknown.  Social History    Social History   Socioeconomic History   Marital status: Married    Spouse name: Not on file   Number of children: 2   Years of education: Not on file   Highest education level: Bachelor's degree (e.g., BA, AB, BS)  Occupational History   Occupation: disabled    Comment: retired  Tobacco Use   Smoking status: Former    Packs/day: 0.50    Years: 47.00    Total pack years: 23.50    Types: Cigarettes    Start date: 02/08/1974   Smokeless tobacco: Never   Tobacco comments:     Quit 01/18/2022  Vaping Use   Vaping Use: Never used  Substance and Sexual Activity   Alcohol use: No    Alcohol/week: 0.0 standard drinks of alcohol   Drug use: No   Sexual activity: Yes    Birth control/protection: None  Other Topics Concern   Not on file  Social History Narrative   Lives at home with stepson and husband, dogs and cats   Caffeine  Drinks sweet tea.   Right handed.    Social Determinants of Health   Financial Resource Strain: Low Risk  (12/11/2021)   Overall Financial Resource Strain (CARDIA)    Difficulty of Paying Living Expenses: Not very hard  Food Insecurity: No Food Insecurity (01/05/2022)   Hunger Vital Sign    Worried About Running Out of Food in the Last Year: Never true    Ran Out of Food in the Last Year: Never true  Transportation Needs: No Transportation Needs (01/05/2022)   PRAPARE - Hydrologist (Medical): No    Lack of Transportation (Non-Medical): No  Physical Activity: Inactive (12/11/2021)   Exercise Vital Sign    Days of Exercise per Week: 0 days    Minutes of Exercise per Session: 0 min  Stress: Stress Concern Present (12/11/2021)   [place]    Feeling of Stress : To some extent  Social Connections: Moderately Isolated (12/11/2021)   Social Connection and Isolation Panel [NHANES]    Frequency of Communication with Friends and Family: More than three times a week    Frequency of Social Gatherings with Friends and Family: Once a week    Attends Religious Services: Never  Active Member of Clubs or Organizations: No    Attends Archivist Meetings: Never    Marital Status: Married  Human resources officer Violence: Not At Risk (01/02/2022)   Humiliation, Afraid, Rape, and Kick questionnaire    Fear of Current or Ex-Partner: No    Emotionally Abused: No    Physically Abused: No    Sexually Abused: No     Review of Systems     General:  No chills, fever, night sweats or endorses weight changes with weight loss, endorses fatigue Cardiovascular:  No chest pain, endorses chronic dyspnea on exertion, endorses bilateral peripheral edema, orthopnea, palpitations, paroxysmal nocturnal dyspnea. Dermatological: No rash, lesions/masses Respiratory: No cough, endorses chronic dyspnea Urologic: No hematuria, dysuria Abdominal:   No nausea, vomiting, diarrhea, bright red blood per rectum, melena, or hematemesis Neurologic:  No visual changes, wkns, changes in mental status. All other systems reviewed and are otherwise negative except as noted above.   Physical Exam    VS:  BP 96/61 (BP Location: Left Arm, Patient Position: Sitting, Cuff Size: Large)   Pulse 63   Ht '5\' 6"'$  (1.676 m)   Wt 218 lb (98.9 kg)   SpO2 91%   BMI 35.19 kg/m  , BMI Body mass index is 35.19 kg/m.     GEN: Well nourished, well developed, in no acute distress. HEENT: normal.  Glasses on Neck: Supple, no JVD, carotid bruits, or masses. Cardiac: RRR, no murmurs, rubs, or gallops. No clubbing, cyanosis, edema.  Radials 2+/PT 2+ and equal bilaterally.  Respiratory:  Respirations regular and unlabored, clear to auscultation bilaterally. GI: Soft, nontender, nondistended, BS + x 4. MS: no deformity or atrophy. Skin: warm and dry, no rash. Neuro:  Strength and sensation are intact. Psych: Normal affect.  Accessory Clinical Findings    ECG personally reviewed by me today-no new tracings were completed today  Lab Results  Component Value Date   WBC 9.2 01/26/2022   HGB 9.2 (L) 01/26/2022   HCT 32.7 (L) 01/26/2022   MCV 84.9 01/26/2022   PLT 485 (H) 01/26/2022   Lab Results  Component Value Date   CREATININE 3.41 (H) 02/03/2022   BUN 58 (H) 02/03/2022   NA 143 02/03/2022   K 4.1 02/03/2022   CL 102 02/03/2022   CO2 30 02/03/2022   Lab Results  Component Value Date   ALT 10 12/15/2021   AST 13 (L) 12/15/2021   ALKPHOS 81 12/15/2021    BILITOT 0.4 12/15/2021   Lab Results  Component Value Date   CHOL 107 12/04/2021   HDL 36 (L) 12/04/2021   LDLCALC 50 12/04/2021   TRIG 112 12/04/2021   CHOLHDL 3.0 12/04/2021    Lab Results  Component Value Date   HGBA1C 10.2 (H) 11/23/2021    Assessment & Plan   1.  Nonischemic cardiomyopathy/ HFrEF with last LVEF 45-50% on 09/29/2021.  With her chronic shortness of breath and peripheral edema has slowly been improving since discharge from the hospital.  At last visit with heart failure clinic and torsemide was decreased due to elevated kidney function after BMP.  She is currently continued on carvedilol 25 mg twice daily, Entresto 49/51 mg twice daily and torsemide 20 mg daily.  GDMT limited by CKD 4.  No spironolactone due to kidney function.  Repeat BMP ordered today.  Can consider SGLT2 inhibitor later on if GFR remains greater than 20 Unna boots have also been removed from her bilateral lower extremities with home health following to  determine if she needs them replaced by this Friday.  She is advised to continue with daily weights, decreasing her sodium intake, and watching her fluid intake.  2.  Atherosclerosis of the native arteries of the extremities with ulcerations that she has underwent percutaneous transluminal angioplasty and stent placement on the left.  Unfortunately this was complicated by acute renal failure required dialysis during her hospitalization.  She continues to be followed by vascular.  She is continued on Plavix 75 mg daily and apixaban and lieu of aspirin.  3.  Hypertension with blood pressure today of 96/61.  Currently she is continued on carvedilol, Entresto, reduced torsemide dosing.  If she becomes symptomatic to lower blood pressures may need to reduce dosages.  Encouraged to monitor her pressure at home as well.  4.  Hyperlipidemia with previous LDL of 50.  She is continued on atorvastatin 40 mg daily.  This continues to be followed by her PCP.  5.   Chronic DVT of the femoral vein on the right lower extremity she has been maintained on chronic anticoagulation of apixaban 5 mg twice daily.  Requesting refill of apixaban today to be sent to [facility] per the patient's request.  6.  CKD stage IV with last serum creatinine of 3.41 on 02/03/2022.  At that time torsemide was decreased from 40 mg daily to 20 mg daily.  She will have repeat BMP completed today to reevaluate kidney function.  She also continues to be followed by nephrology.  7.  Chronic obstructive pulmonary disease which is longstanding.  She is continued on her regular pulmonary medications as ordered.  She is also continues to be followed by pulmonary where Dr. Domingo Dimes recently ordered a repeat limited echocardiogram with bubble study for patent PFO.  She is also been encouraged and advised that she needs to wear oxygen 24/7.  She has had 91% on room air today.  She continues to wait for her portable oxygen to be delivered so that she is able to comply with 24/7 therapy.  8.  Disposition patient return to clinic to see MD/APP in the next 4 to 5 months that she has a several upcoming appointments advanced heart failure clinic and various providers.  She is also been advised to notify us if she needs to be seen sooner.  Garvin Ellena, NP 02/11/2022, 11:14 AM

## 2022-02-11 ENCOUNTER — Encounter: Payer: Self-pay | Admitting: Cardiology

## 2022-02-11 ENCOUNTER — Ambulatory Visit: Payer: Medicare Other | Attending: Cardiology | Admitting: Cardiology

## 2022-02-11 ENCOUNTER — Other Ambulatory Visit
Admission: RE | Admit: 2022-02-11 | Discharge: 2022-02-11 | Disposition: A | Discharge status: Home / Self Care | Payer: Medicare Other | Source: Ambulatory Visit | Attending: Cardiology | Admitting: Cardiology

## 2022-02-11 VITALS — BP 96/61 | HR 63 | Ht 66.0 in | Wt 218.0 lb

## 2022-02-11 DIAGNOSIS — E785 Hyperlipidemia, unspecified: Secondary | ICD-10-CM | POA: Insufficient documentation

## 2022-02-11 DIAGNOSIS — I700 Atherosclerosis of aorta: Secondary | ICD-10-CM

## 2022-02-11 DIAGNOSIS — E1122 Type 2 diabetes mellitus with diabetic chronic kidney disease: Secondary | ICD-10-CM | POA: Diagnosis not present

## 2022-02-11 DIAGNOSIS — N184 Chronic kidney disease, stage 4 (severe): Secondary | ICD-10-CM | POA: Diagnosis not present

## 2022-02-11 DIAGNOSIS — I10 Essential (primary) hypertension: Secondary | ICD-10-CM | POA: Insufficient documentation

## 2022-02-11 DIAGNOSIS — D631 Anemia in chronic kidney disease: Secondary | ICD-10-CM | POA: Diagnosis not present

## 2022-02-11 DIAGNOSIS — J449 Chronic obstructive pulmonary disease, unspecified: Secondary | ICD-10-CM | POA: Diagnosis not present

## 2022-02-11 DIAGNOSIS — I152 Hypertension secondary to endocrine disorders: Secondary | ICD-10-CM | POA: Diagnosis not present

## 2022-02-11 DIAGNOSIS — I5033 Acute on chronic diastolic (congestive) heart failure: Secondary | ICD-10-CM | POA: Diagnosis not present

## 2022-02-11 DIAGNOSIS — I428 Other cardiomyopathies: Secondary | ICD-10-CM | POA: Diagnosis not present

## 2022-02-11 DIAGNOSIS — I5022 Chronic systolic (congestive) heart failure: Secondary | ICD-10-CM | POA: Diagnosis not present

## 2022-02-11 DIAGNOSIS — I82511 Chronic embolism and thrombosis of right femoral vein: Secondary | ICD-10-CM | POA: Diagnosis not present

## 2022-02-11 DIAGNOSIS — E1169 Type 2 diabetes mellitus with other specified complication: Secondary | ICD-10-CM

## 2022-02-11 DIAGNOSIS — E1159 Type 2 diabetes mellitus with other circulatory complications: Secondary | ICD-10-CM | POA: Diagnosis not present

## 2022-02-11 LAB — BASIC METABOLIC PANEL
Anion gap: 11 (ref 5–15)
BUN: 52 mg/dL — ABNORMAL HIGH (ref 8–23)
CO2: 29 mmol/L (ref 22–32)
Calcium: 8.6 mg/dL — ABNORMAL LOW (ref 8.9–10.3)
Chloride: 99 mmol/L (ref 98–111)
Creatinine, Ser: 3.4 mg/dL — ABNORMAL HIGH (ref 0.44–1.00)
GFR, Estimated: 14 mL/min — ABNORMAL LOW (ref >60)
Glucose, Bld: 193 mg/dL — ABNORMAL HIGH (ref 70–99)
Potassium: 4 mmol/L (ref 3.5–5.1)
Sodium: 139 mmol/L (ref 135–145)

## 2022-02-11 LAB — BRAIN NATRIURETIC PEPTIDE: B Natriuretic Peptide: 735.3 pg/mL — ABNORMAL HIGH (ref 0.0–100.0)

## 2022-02-11 MED ORDER — PEG 3350-KCL-NA BICARB-NACL 420 G PO SOLR: 4000.0000 mL | Freq: Once | ORAL | 0 refills | Status: AC

## 2022-02-11 MED ORDER — APIXABAN 5 MG PO TABS: 5.0000 mg | ORAL_TABLET | Freq: Two times a day (BID) | ORAL | 2 refills | Status: DC

## 2022-02-11 NOTE — Addendum Note (Signed)
Addended by: Britt Bottom on: 02/11/2022 11:29 AM   Modules accepted: Orders

## 2022-02-11 NOTE — Patient Instructions (Signed)
Medication Instructions:   Your physician recommends that you continue on your current medications as directed. Please refer to the Current Medication list given to you today.  *If you need a refill on your cardiac medications before your next appointment, please call your pharmacy*   Lab Work:  Your physician recommends that you have lab work TODAY at [facility]. Please check in at registration desk and they will direct you to lab.  BNP/BMP  If you have labs (blood work) drawn today and your tests are completely normal, you will receive your results only by: [place] (if you have MyChart) OR A paper copy in the mail If you have any lab test that is abnormal or we need to change your treatment, we will call you to review the results.   Testing/Procedures:  NONE   Follow-Up: At [facility], you and your health needs are our priority.  As part of our continuing mission to provide you with exceptional heart care, we have created designated Provider Care Teams.  These Care Teams include your primary Cardiologist (physician) and Advanced Practice Providers (APPs -  Physician Assistants and Nurse Practitioners) who all work together to provide you with the care you need, when you need it.  We recommend signing up for the patient portal called "MyChart".  Sign up information is provided on this After Visit Summary.  MyChart is used to connect with patients for Virtual Visits (Telemedicine).  Patients are able to view lab/test results, encounter notes, upcoming appointments, etc.  Non-urgent messages can be sent to your provider as well.   To learn more about what you can do with MyChart, go to [email or web address].    Your next appointment:   4-5  month(s)  The format for your next appointment:   In Person  Provider:   You may see or one of the following Advanced Practice Providers on your designated Care Team:   Murray Hodgkins, NP Christell Faith,  PA-C Cadence Kathlen Mody, PA-C Gerrie Nordmann, NP     Important Information About Sugar

## 2022-02-11 NOTE — Addendum Note (Signed)
Addended byGerrie Nordmann on: 02/11/2022 11:28 AM   Modules accepted: Orders

## 2022-02-11 NOTE — Telephone Encounter (Signed)
Called patient on 02/10/2022 but not answer. Called patient again 02/11/2022 and this time she answer, I reminded patient regarding Eliquis and she stated that she already stop the medication.  [facility] but was hold 2 times so I just went a head and resent the Rx for NULYTELY as it was first send by Sharyn Lull.

## 2022-02-11 NOTE — Progress Notes (Signed)
Kidney function continues to remain elevated. Would recommend decreasing her torsemide to three times weekly M-W-F, continue the entresto at current dose, and repeat bmp again in 2 weeks

## 2022-02-11 NOTE — Addendum Note (Signed)
Addended by: Jacqualin Combes on: 02/11/2022 10:42 AM   Modules accepted: Orders

## 2022-02-12 ENCOUNTER — Other Ambulatory Visit: Payer: Self-pay | Admitting: *Deleted

## 2022-02-12 DIAGNOSIS — E1122 Type 2 diabetes mellitus with diabetic chronic kidney disease: Secondary | ICD-10-CM | POA: Diagnosis not present

## 2022-02-12 DIAGNOSIS — D631 Anemia in chronic kidney disease: Secondary | ICD-10-CM | POA: Diagnosis not present

## 2022-02-12 DIAGNOSIS — I5022 Chronic systolic (congestive) heart failure: Secondary | ICD-10-CM

## 2022-02-12 DIAGNOSIS — I7025 Atherosclerosis of native arteries of other extremities with ulceration: Secondary | ICD-10-CM

## 2022-02-12 DIAGNOSIS — N184 Chronic kidney disease, stage 4 (severe): Secondary | ICD-10-CM

## 2022-02-12 DIAGNOSIS — E1159 Type 2 diabetes mellitus with other circulatory complications: Secondary | ICD-10-CM | POA: Diagnosis not present

## 2022-02-12 DIAGNOSIS — I428 Other cardiomyopathies: Secondary | ICD-10-CM

## 2022-02-12 DIAGNOSIS — I5033 Acute on chronic diastolic (congestive) heart failure: Secondary | ICD-10-CM | POA: Diagnosis not present

## 2022-02-12 DIAGNOSIS — I152 Hypertension secondary to endocrine disorders: Secondary | ICD-10-CM | POA: Diagnosis not present

## 2022-02-12 MED ORDER — TORSEMIDE 20 MG PO TABS: 20.0000 mg | ORAL_TABLET | ORAL | 3 refills | Status: DC

## 2022-02-12 NOTE — Addendum Note (Signed)
Addended by: Valora Corporal on: 02/12/2022 10:44 AM   Modules accepted: Orders

## 2022-02-15 ENCOUNTER — Encounter: Payer: Self-pay | Admitting: Gastroenterology

## 2022-02-15 ENCOUNTER — Ambulatory Visit: Payer: Medicare Other | Admitting: Certified Registered"

## 2022-02-15 ENCOUNTER — Encounter
Admission: RE | Disposition: A | Discharge status: Home / Self Care | Payer: Self-pay | Source: Home / Self Care | Attending: Gastroenterology

## 2022-02-15 ENCOUNTER — Ambulatory Visit
Admission: RE | Admit: 2022-02-15 | Discharge: 2022-02-15 | Disposition: A | Discharge status: Home / Self Care | Payer: Medicare Other | Attending: Gastroenterology | Admitting: Gastroenterology

## 2022-02-15 DIAGNOSIS — F17200 Nicotine dependence, unspecified, uncomplicated: Secondary | ICD-10-CM | POA: Insufficient documentation

## 2022-02-15 DIAGNOSIS — F419 Anxiety disorder, unspecified: Secondary | ICD-10-CM | POA: Insufficient documentation

## 2022-02-15 DIAGNOSIS — E669 Obesity, unspecified: Secondary | ICD-10-CM | POA: Insufficient documentation

## 2022-02-15 DIAGNOSIS — Z9981 Dependence on supplemental oxygen: Secondary | ICD-10-CM | POA: Insufficient documentation

## 2022-02-15 DIAGNOSIS — N189 Chronic kidney disease, unspecified: Secondary | ICD-10-CM | POA: Insufficient documentation

## 2022-02-15 DIAGNOSIS — I130 Hypertensive heart and chronic kidney disease with heart failure and stage 1 through stage 4 chronic kidney disease, or unspecified chronic kidney disease: Secondary | ICD-10-CM | POA: Diagnosis not present

## 2022-02-15 DIAGNOSIS — D122 Benign neoplasm of ascending colon: Secondary | ICD-10-CM | POA: Diagnosis not present

## 2022-02-15 DIAGNOSIS — K635 Polyp of colon: Secondary | ICD-10-CM | POA: Insufficient documentation

## 2022-02-15 DIAGNOSIS — F32A Depression, unspecified: Secondary | ICD-10-CM | POA: Diagnosis not present

## 2022-02-15 DIAGNOSIS — Z86010 Personal history of colonic polyps: Secondary | ICD-10-CM

## 2022-02-15 DIAGNOSIS — J449 Chronic obstructive pulmonary disease, unspecified: Secondary | ICD-10-CM | POA: Diagnosis not present

## 2022-02-15 DIAGNOSIS — I509 Heart failure, unspecified: Secondary | ICD-10-CM | POA: Insufficient documentation

## 2022-02-15 DIAGNOSIS — F418 Other specified anxiety disorders: Secondary | ICD-10-CM | POA: Diagnosis not present

## 2022-02-15 DIAGNOSIS — D509 Iron deficiency anemia, unspecified: Secondary | ICD-10-CM

## 2022-02-15 DIAGNOSIS — I429 Cardiomyopathy, unspecified: Secondary | ICD-10-CM | POA: Insufficient documentation

## 2022-02-15 DIAGNOSIS — K3189 Other diseases of stomach and duodenum: Secondary | ICD-10-CM | POA: Insufficient documentation

## 2022-02-15 DIAGNOSIS — E1122 Type 2 diabetes mellitus with diabetic chronic kidney disease: Secondary | ICD-10-CM | POA: Insufficient documentation

## 2022-02-15 DIAGNOSIS — Z6835 Body mass index (BMI) 35.0-35.9, adult: Secondary | ICD-10-CM | POA: Insufficient documentation

## 2022-02-15 HISTORY — PX: COLONOSCOPY WITH PROPOFOL: SHX5780

## 2022-02-15 HISTORY — PX: ESOPHAGOGASTRODUODENOSCOPY: SHX5428

## 2022-02-15 LAB — GLUCOSE, CAPILLARY
Glucose-Capillary: 56 mg/dL — ABNORMAL LOW (ref 70–99)
Glucose-Capillary: 88 mg/dL (ref 70–99)
Glucose-Capillary: 77 mg/dL (ref 70–99)

## 2022-02-15 SURGERY — COLONOSCOPY WITH PROPOFOL
Anesthesia: General

## 2022-02-15 MED ORDER — PROPOFOL 10 MG/ML IV BOLUS
INTRAVENOUS | Status: DC | PRN
  Administered 2022-02-15: 80 mg via INTRAVENOUS
  Administered 2022-02-15 (×2): 10 mg via INTRAVENOUS

## 2022-02-15 MED ORDER — PROPOFOL 500 MG/50ML IV EMUL
INTRAVENOUS | Status: DC | PRN
  Administered 2022-02-15: 150 ug/kg/min via INTRAVENOUS

## 2022-02-15 MED ORDER — SODIUM CHLORIDE 0.9 % IV SOLN: INTRAVENOUS | Status: DC

## 2022-02-15 MED ORDER — LIDOCAINE HCL (CARDIAC) PF 100 MG/5ML IV SOSY
PREFILLED_SYRINGE | INTRAVENOUS | Status: DC | PRN
  Administered 2022-02-15: 100 mg via INTRAVENOUS

## 2022-02-15 MED ORDER — DEXTROSE 50 % IV SOLN
INTRAVENOUS | Status: AC
  Filled 2022-02-15: qty 50

## 2022-02-15 MED ORDER — DEXMEDETOMIDINE HCL IN NACL 80 MCG/20ML IV SOLN
INTRAVENOUS | Status: DC | PRN
  Administered 2022-02-15: 8 ug via BUCCAL
  Administered 2022-02-15: 12 ug via BUCCAL

## 2022-02-15 MED ORDER — DEXTROSE 50 % IV SOLN
25.0000 mL | Freq: Once | INTRAVENOUS | Status: AC
  Administered 2022-02-15: 25 mL via INTRAVENOUS

## 2022-02-15 NOTE — H&P (Signed)
Jocelyn Darby, MD [address]  [place]  [place], [place] [address]  Main: [phone]  Fax: [phone] Pager: [phone]  Primary Care Physician:  Gwyneth Sprout, FNP Primary Gastroenterologist:  Dr. Cephas Wells  Pre-Procedure History & Physical: HPI:  STARLINA LAPRE is a 67 y.o. female is here for an endoscopy and colonoscopy.   Past Medical History:  Diagnosis Date   Anemia    Anxiety    Cardiomyopathy [facility])    new to her Jan 2017   CHF (congestive heart failure) (HCC)    Chronic kidney disease    COPD (chronic obstructive pulmonary disease) (HCC)    Depression    Diabetes mellitus, type II ([place])    HTN (hypertension)    Leukocytosis    Obesity    Osteoporosis    PONV (postoperative nausea and vomiting)    Stroke [facility])    Jan 2017    Past Surgical History:  Procedure Laterality Date   ABDOMINAL HYSTERECTOMY     ANKLE FRACTURE SURGERY Left 02/09/2000   BILATERAL SALPINGOOPHORECTOMY  02/08/1998   CARDIAC CATHETERIZATION N/A 03/25/2015   Procedure: Right/Left Heart Cath and Coronary Angiography;  Surgeon: Belva Crome, MD;  Location: [place] [facility];  Service: Cardiovascular;  Laterality: N/A;   CESAREAN SECTION  02/09/1976   COLONOSCOPY WITH PROPOFOL N/A 09/22/2016   Procedure: COLONOSCOPY WITH PROPOFOL;  Surgeon: Manya Silvas, MD;  Location: [facility] ENDOSCOPY;  Service: Endoscopy;  Laterality: N/A;   EP IMPLANTABLE DEVICE N/A 08/05/2015   Procedure: Loop Recorder Insertion;  Surgeon: Deboraha Sprang, MD;  Location: [place] [facility];  Service: Cardiovascular;  Laterality: N/A;   ESOPHAGOGASTRODUODENOSCOPY (EGD) WITH PROPOFOL N/A 09/22/2016   Procedure: ESOPHAGOGASTRODUODENOSCOPY (EGD) WITH PROPOFOL;  Surgeon: Manya Silvas, MD;  Location[facility] ENDOSCOPY;  Service: Endoscopy;  Laterality: N/A;   ESOPHAGOGASTRODUODENOSCOPY (EGD) WITH PROPOFOL N/A 12/08/2016   Procedure: ESOPHAGOGASTRODUODENOSCOPY (EGD) WITH PROPOFOL;  Surgeon: Manya Silvas, MD;  Location: [facility] ENDOSCOPY;  Service: Endoscopy;  Laterality: N/A;   FRACTURE SURGERY     KNEE ARTHROSCOPY Left 02/09/2003   LOWER EXTREMITY ANGIOGRAPHY Left 08/12/2021   Procedure: Lower Extremity Angiography;  Surgeon: Katha Cabal, MD;  Location: [place] [facility];  Service: Cardiovascular;  Laterality: Left;   TEE WITHOUT CARDIOVERSION N/A 01/31/2015   Procedure: TRANSESOPHAGEAL ECHOCARDIOGRAM (TEE);  Surgeon: Lelon Perla, MD;  Location: [facility] ENDOSCOPY;  Service: Cardiovascular;  Laterality: N/A;   TEMPORARY DIALYSIS CATHETER N/A 08/14/2021   Procedure: TEMPORARY DIALYSIS CATHETER;  Surgeon: Katha Cabal, MD;  Location: [place] [facility];  Service: Cardiovascular;  Laterality: N/A;   TUBAL LIGATION  02/09/1976   ULNAR NERVE TRANSPOSITION  02/08/2006    Prior to Admission medications   Medication Sig Start Date End Date Taking? Authorizing Provider  albuterol (VENTOLIN HFA) 108 (90 Base) MCG/ACT inhaler TAKE 2 PUFFS BY MOUTH EVERY 6 HOURS AS NEEDED FOR WHEEZE OR SHORTNESS OF BREATH Patient taking differently: Inhale 2 puffs into the lungs every 6 (six) hours as needed for wheezing or shortness of breath. 06/19/21   Gwyneth Sprout, FNP  apixaban (ELIQUIS) 5 MG TABS tablet Take 1 tablet (5 mg total) by mouth 2 (two) times daily. 02/11/22   Gerrie Nordmann, NP  atorvastatin (LIPITOR) 40 MG tablet Take 1 tablet (40 mg total) by mouth daily. 09/08/21   Gwyneth Sprout, FNP  buPROPion (WELLBUTRIN SR) 150 MG 12 hr tablet Take 1 tablet (150 mg total) by mouth  2 (two) times daily. 01/18/22   Gwyneth Sprout, FNP  carvedilol (COREG) 25 MG tablet Take 1 tablet (25 mg total) by mouth 2 (two) times daily with a meal. 01/04/22   Shelly Coss, MD  clopidogrel (PLAVIX) 75 MG tablet Take 1 tablet (75 mg total) by mouth daily. 10/23/21   Gerrie Nordmann, NP  Dulaglutide 3 MG/0.5ML SOPN Inject into the skin once a week. 04/17/21   [provider]  fluticasone (FLONASE) 50  MCG/ACT nasal spray Place 2 sprays into both nostrils daily. 02/13/21   Gwyneth Sprout, FNP  Fluticasone-Umeclidin-Vilant (TRELEGY ELLIPTA) 100-62.5-25 MCG/ACT AEPB Inhale 1 puff into the lungs daily. 01/29/22   Tyler Pita, MD  Fluticasone-Umeclidin-Vilant (TRELEGY ELLIPTA) 100-62.5-25 MCG/ACT AEPB Inhale 1 puff into the lungs daily. Patient not taking: Reported on 02/03/2022 01/29/22   Tyler Pita, MD  gabapentin (NEURONTIN) 300 MG capsule Take 300 mg by mouth 2 (two) times daily.    [provider]  glucose blood test strip OneTouch Verio strips    [provider]  insulin aspart (NOVOLOG) 100 UNIT/ML injection Insulin pump 05/26/20   [provider]  Insulin Disposable Pump (OMNIPOD DASH PODS, GEN 4,) MISC USE 1 POD EACH EVERY 72    HOURS 10/29/20   [provider]  Insulin Human (INSULIN PUMP) SOLN Per endocrinoloy 06/01/20   Elgergawy, Silver Huguenin, MD  loratadine (CLARITIN) 10 MG tablet Take 1 tablet by mouth daily.    [provider]  meclizine (ANTIVERT) 12.5 MG tablet Take 1 tablet (12.5 mg total) by mouth 3 (three) times daily as needed for dizziness. 11/23/21   Myles Gip, DO  OXYGEN Place 2 L into the nose continuous.    [provider]  pantoprazole (PROTONIX) 40 MG tablet Take 1 tablet (40 mg total) by mouth 2 (two) times daily. 08/23/21   Sharen Hones, MD  PARoxetine (PAXIL) 40 MG tablet Take 1 tablet (40 mg total) by mouth daily. 10/12/21 04/10/22  Norman Clay, MD  sacubitril-valsartan (ENTRESTO) 49-51 MG Take 1 tablet by mouth 2 (two) times daily. 01/26/22   Bensimhon, Shaune Pascal, MD  Spacer/Aero-Holding [facility] DIAMOND) MISC Use with inhaler to ensure medication is delivered throughout lungs 02/13/21   Tally Joe T, FNP  torsemide [facility]) 20 MG tablet Take 1 tablet (20 mg total) by mouth every Monday, Wednesday, and Friday. 02/12/22 05/13/22  Gerrie Nordmann, NP  traMADol (ULTRAM) 50 MG tablet Take 1 tablet  (50 mg total) by mouth every 12 (twelve) hours as needed. 11/03/21 03/03/22  Gillis Santa, MD  Vitamin D, Ergocalciferol, (DRISDOL) 1.25 MG (50000 UNIT) CAPS capsule Take 50,000 Units by mouth every 7 (seven) days. 01/24/22   [provider]    Allergies as of 12/10/2021 - Review Complete 12/10/2021  Allergen Reaction Noted   Codeine Nausea And Vomiting 07/05/2014   Ivp dye [iodinated contrast media]  12/10/2021    Family History  Problem Relation Age of Onset   Heart disease Mother        died from CHF   Asthma Mother    Diabetes Mother    Heart disease Father    Aneurysm Father    COPD Brother    Diabetes Brother    Alcohol abuse Paternal Aunt    Cancer Maternal Grandmother        unknownorigin   Anemia Neg Hx    Arrhythmia Neg Hx    Clotting disorder Neg Hx    Fainting Neg Hx  Heart attack Neg Hx    Heart failure Neg Hx    Hyperlipidemia Neg Hx    Hypertension Neg Hx     Social History   Socioeconomic History   Marital status: Married    Spouse name: Not on file   Number of children: 2   Years of education: Not on file   Highest education level: Bachelor's degree (e.g., BA, AB, BS)  Occupational History   Occupation: disabled    Comment: retired  Tobacco Use   Smoking status: Some Days    Packs/day: 0.50    Years: 47.00    Total pack years: 23.50    Types: Cigarettes    Start date: 02/08/1974   Smokeless tobacco: Never   Tobacco comments:    Quit 01/18/2022  Vaping Use   Vaping Use: Never used  Substance and Sexual Activity   Alcohol use: No    Alcohol/week: 0.0 standard drinks of alcohol   Drug use: No   Sexual activity: Yes    Birth control/protection: None  Other Topics Concern   Not on file  Social History Narrative   Lives at home with stepson and husband, dogs and cats   Caffeine  Drinks sweet tea.   Right handed.    Social Determinants of Health   Financial Resource Strain: Low Risk  (12/11/2021)   Overall Financial Resource  Strain (CARDIA)    Difficulty of Paying Living Expenses: Not very hard  Food Insecurity: No Food Insecurity (01/05/2022)   Hunger Vital Sign    Worried About Running Out of Food in the Last Year: Never true    Ran Out of Food in the Last Year: Never true  Transportation Needs: No Transportation Needs (01/05/2022)   PRAPARE - Hydrologist (Medical): No    Lack of Transportation (Non-Medical): No  Physical Activity: Inactive (12/11/2021)   Exercise Vital Sign    Days of Exercise per Week: 0 days    Minutes of Exercise per Session: 0 min  Stress: Stress Concern Present (12/11/2021)   [place]    Feeling of Stress : To some extent  Social Connections: Moderately Isolated (12/11/2021)   Social Connection and Isolation Panel [NHANES]    Frequency of Communication with Friends and Family: More than three times a week    Frequency of Social Gatherings with Friends and Family: Once a week    Attends Religious Services: Never    Marine scientist or Organizations: No    Attends Archivist Meetings: Never    Marital Status: Married  Human resources officer Violence: Not At Risk (01/02/2022)   Humiliation, Afraid, Rape, and Kick questionnaire    Fear of Current or Ex-Partner: No    Emotionally Abused: No    Physically Abused: No    Sexually Abused: No    Review of Systems: See HPI, otherwise negative ROS  Physical Exam: BP (!) 106/57   Pulse 62   Temp (!) 96 F (35.6 C) (Temporal)   Resp 18   Ht '5\' 6"'$  (1.676 m)   Wt 218 lb (98.9 kg)   SpO2 96%   BMI 35.19 kg/m  General:   Alert,  pleasant and cooperative in NAD Head:  Normocephalic and atraumatic. Neck:  Supple; no masses or thyromegaly. Lungs:  Clear throughout to auscultation.    Heart:  Regular rate and rhythm. Abdomen:  Soft, nontender and nondistended. Normal bowel sounds, without guarding, and  without rebound.    Neurologic:  Alert and  oriented x4;  grossly normal neurologically.  Impression/Plan: [name] is here for an endoscopy and colonoscopy to be performed for iron deficiency anemia  Risks, benefits, limitations, and alternatives regarding  endoscopy and colonoscopy have been reviewed with the patient.  Questions have been answered.  All parties agreeable.   Sherri Sear, MD  02/15/2022, 11:04 AM

## 2022-02-15 NOTE — Telephone Encounter (Unsigned)
Copied from [place] [id number]. Topic: Quick Communication - Home Health Verbal Orders >> Feb 12, 2022  3:25 PM Everette C wrote: Caller/Agency: [facility] [place] / [facility]  Callback Number: [phone] Requesting OT/PT/Skilled Nursing/Social Work/Speech Therapy: OT Frequency: 1w5

## 2022-02-15 NOTE — Transfer of Care (Signed)
Immediate Anesthesia Transfer of Care Note  Patient: Jocelyn Wells  Procedure(s) Performed: COLONOSCOPY WITH PROPOFOL ESOPHAGOGASTRODUODENOSCOPY (EGD)  Patient Location: PACU and Endoscopy Unit  Anesthesia Type:General  Level of Consciousness: sedated  Airway & Oxygen Therapy: Patient Spontanous Breathing and Patient connected to face mask oxygen  Post-op Assessment: Report given to RN and Post -op Vital signs reviewed and stable  Post vital signs: Reviewed and stable  Last Vitals:  Vitals Value Taken Time  BP 106/57 02/15/22 1051  Temp 35.6 C 02/15/22 1050  Pulse 55 02/15/22 1051  Resp 14 02/15/22 1051  SpO2 98 % 02/15/22 1051  Vitals shown include unvalidated device data.  Last Pain:  Vitals:   02/15/22 1050  TempSrc: Temporal  PainSc: Asleep         Complications: No notable events documented.

## 2022-02-15 NOTE — Op Note (Signed)
[facility] Gastroenterology Patient Name: Jocelyn Wells Procedure Date: 02/15/2022 9:48 AM MRN: [id number] Account #: [id number] Date of Birth: 14-Oct-1955 Admit Type: Outpatient Age: 67 Room: [facility] ENDO ROOM 4 Gender: Female Note Status: Finalized Instrument Name: Altamese Cabal Endoscope [id number] Procedure:             Upper GI endoscopy Indications:           Unexplained iron deficiency anemia Providers:             Lin Landsman MD, MD Referring MD:          Tally Joe Medicines:             General Anesthesia Complications:         No immediate complications. Estimated blood loss: None. Procedure:             Pre-Anesthesia Assessment:                        - Prior to the procedure, a History and Physical was                         performed, and patient medications and allergies were                         reviewed. The patient is competent. The risks and                         benefits of the procedure and the sedation options and                         risks were discussed with the patient. All questions                         were answered and informed consent was obtained.                         Patient identification and proposed procedure were                         verified by the physician, the nurse, the                         anesthesiologist, the anesthetist and the technician                         in the pre-procedure area in the procedure room in the                         endoscopy suite. Mental Status Examination: alert and                         oriented. Airway Examination: normal oropharyngeal                         airway and neck mobility. Respiratory Examination:                         clear to auscultation. CV Examination: normal.  Prophylactic Antibiotics: The patient does not require                         prophylactic antibiotics. Prior Anticoagulants: The                         patient has taken  Plavix (clopidogrel), last dose was                         7 days prior to procedure. ASA Grade Assessment: III -                         A patient with severe systemic disease. After                         reviewing the risks and benefits, the patient was                         deemed in satisfactory condition to undergo the                         procedure. The anesthesia plan was to use general                         anesthesia. Immediately prior to administration of                         medications, the patient was re-assessed for adequacy                         to receive sedatives. The heart rate, respiratory                         rate, oxygen saturations, blood pressure, adequacy of                         pulmonary ventilation, and response to care were                         monitored throughout the procedure. The physical                         status of the patient was re-assessed after the                         procedure.                        After obtaining informed consent, the endoscope was                         passed under direct vision. Throughout the procedure,                         the patient's blood pressure, pulse, and oxygen                         saturations were monitored continuously. The Endoscope  was introduced through the mouth, and advanced to the                         second part of duodenum. The upper GI endoscopy was                         accomplished without difficulty. The patient tolerated                         the procedure well. Findings:      The duodenal bulb, second portion of the duodenum and third portion of       the duodenum were normal. Biopsies were taken with a cold forceps for       histology.      The entire examined stomach was normal. Biopsies were taken with a cold       forceps for Helicobacter pylori testing.      The cardia and gastric fundus were normal on retroflexion.      The  gastroesophageal junction and examined esophagus were normal. Impression:            - Normal duodenal bulb, second portion of the duodenum                         and third portion of the duodenum. Biopsied.                        - Normal stomach. Biopsied.                        - Normal gastroesophageal junction and esophagus. Recommendation:        - Await pathology results.                        - Proceed with colonoscopy as scheduled                        See colonoscopy report Procedure Code(s):     --- Professional ---                        [phone], Esophagogastroduodenoscopy, flexible,                         transoral; with biopsy, single or multiple Diagnosis Code(s):     --- Professional ---                        D50.9, Iron deficiency anemia, unspecified CPT copyright 2022 American Medical Association. All rights reserved. The codes documented in this report are preliminary and upon coder review may  be revised to meet current compliance requirements. Dr. Ulyess Mort Lin Landsman MD, MD 02/15/2022 10:19:27 AM This report has been signed electronically. Number of Addenda: 0 Note Initiated On: 02/15/2022 9:48 AM Estimated Blood Loss:  Estimated blood loss: none.      [facility]

## 2022-02-15 NOTE — Anesthesia Procedure Notes (Signed)
Procedure Name: MAC Date/Time: 02/15/2022 9:57 AM  Performed by: Biagio Borg, CRNAPre-anesthesia Checklist: Patient identified, Emergency Drugs available, Suction available, Patient being monitored and Timeout performed Patient Re-evaluated:Patient Re-evaluated prior to induction Oxygen Delivery Method: Nasal cannula and Simple face mask Induction Type: IV induction Placement Confirmation: positive ETCO2 and CO2 detector

## 2022-02-15 NOTE — Anesthesia Postprocedure Evaluation (Signed)
Anesthesia Post Note  Patient: Denym A Dorn  Procedure(s) Performed: COLONOSCOPY WITH PROPOFOL ESOPHAGOGASTRODUODENOSCOPY (EGD)  Patient location during evaluation: Endoscopy Anesthesia Type: General Level of consciousness: awake and alert Pain management: pain level controlled Vital Signs Assessment: post-procedure vital signs reviewed and stable Respiratory status: spontaneous breathing, nonlabored ventilation, respiratory function stable and patient connected to nasal cannula oxygen Cardiovascular status: blood pressure returned to baseline and stable Postop Assessment: no apparent nausea or vomiting Anesthetic complications: no  No notable events documented.   Last Vitals:  Vitals:   02/15/22 1100 02/15/22 1120  BP: 120/60   Pulse:    Resp:  (!) 21  Temp:    SpO2:      Last Pain:  Vitals:   02/15/22 1120  TempSrc:   PainSc: 0-No pain                 Dimas Millin

## 2022-02-15 NOTE — Anesthesia Preprocedure Evaluation (Signed)
Anesthesia Evaluation  Patient identified by MRN, date of birth, ID band Patient awake    Reviewed: Allergy & Precautions, NPO status , Patient's Chart, lab work & pertinent test results  History of Anesthesia Complications (+) AWARENESS UNDER ANESTHESIA and history of anesthetic complications  Airway Mallampati: III  TM Distance: >3 FB Neck ROM: full    Dental  (+) Chipped, Dental Advidsory Given   Pulmonary neg pulmonary ROS, shortness of breath and with exertion, COPD,  COPD inhaler and oxygen dependent, Current Smoker and Patient abstained from smoking.   Pulmonary exam normal        Cardiovascular hypertension, +CHF and + DOE  (-) Past MI and (-) CABG negative cardio ROS Normal cardiovascular exam     Neuro/Psych  PSYCHIATRIC DISORDERS Anxiety Depression    negative neurological ROS  negative psych ROS   GI/Hepatic negative GI ROS, Neg liver ROS,,,  Endo/Other  negative endocrine ROSdiabetes    Renal/GU negative Renal ROS  negative genitourinary   Musculoskeletal   Abdominal   Peds  Hematology negative hematology ROS (+) Blood dyscrasia, anemia   Anesthesia Other Findings Past Medical History: No date: Anemia No date: Anxiety No date: Cardiomyopathy [facility])     Comment:  new to her Jan 2017 No date: CHF (congestive heart failure) (HCC) No date: Chronic kidney disease No date: COPD (chronic obstructive pulmonary disease) (HCC) No date: Depression No date: Diabetes mellitus, type II ([place]) No date: HTN (hypertension) No date: Leukocytosis No date: Obesity No date: Osteoporosis No date: PONV (postoperative nausea and vomiting) No date: Stroke [facility])     Comment:  Jan 2017  Past Surgical History: No date: ABDOMINAL HYSTERECTOMY 02/09/2000: ANKLE FRACTURE SURGERY; Left 02/08/1998: BILATERAL SALPINGOOPHORECTOMY 03/25/2015: CARDIAC CATHETERIZATION; N/A     Comment:  Procedure: Right/Left Heart Cath and  Coronary               Angiography;  Surgeon: Belva Crome, MD;  Location: [place] [facility];  Service: Cardiovascular;  Laterality:               N/A; 02/09/1976: CESAREAN SECTION 09/22/2016: COLONOSCOPY WITH PROPOFOL; N/A     Comment:  Procedure: COLONOSCOPY WITH PROPOFOL;  Surgeon: Manya Silvas, MD;  Location: [facility] ENDOSCOPY;  Service:               Endoscopy;  Laterality: N/A; 08/05/2015: EP IMPLANTABLE DEVICE; N/A     Comment:  Procedure: Loop Recorder Insertion;  Surgeon: Deboraha Sprang, MD;  Location: [place] [facility];  Service:               Cardiovascular;  Laterality: N/A; 09/22/2016: ESOPHAGOGASTRODUODENOSCOPY (EGD) WITH PROPOFOL; N/A     Comment:  Procedure: ESOPHAGOGASTRODUODENOSCOPY (EGD) WITH               PROPOFOL;  Surgeon: Manya Silvas, MD;  Location:               [facility] ENDOSCOPY;  Service: Endoscopy;  Laterality: N/A; 12/08/2016: ESOPHAGOGASTRODUODENOSCOPY (EGD) WITH PROPOFOL; N/A     Comment:  Procedure: ESOPHAGOGASTRODUODENOSCOPY (EGD) WITH               PROPOFOL;  Surgeon: Manya Silvas, MD;  Location:  [place] ENDOSCOPY;  Service: Endoscopy;  Laterality: N/A; No date: FRACTURE SURGERY 02/09/2003: KNEE ARTHROSCOPY; Left 08/12/2021: LOWER EXTREMITY ANGIOGRAPHY; Left     Comment:  Procedure: Lower Extremity Angiography;  Surgeon:               Katha Cabal, MD;  Location: [place] [facility];               Service: Cardiovascular;  Laterality: Left; 01/31/2015: TEE WITHOUT CARDIOVERSION; N/A     Comment:  Procedure: TRANSESOPHAGEAL ECHOCARDIOGRAM (TEE);                Surgeon: Lelon Perla, MD;  Location: [facility] ENDOSCOPY;                Service: Cardiovascular;  Laterality: N/A; 08/14/2021: TEMPORARY DIALYSIS CATHETER; N/A     Comment:  Procedure: TEMPORARY DIALYSIS CATHETER;  Surgeon:               Katha Cabal, MD;  Location: [place] [facility] LAB;               Service:  Cardiovascular;  Laterality: N/A; 02/09/1976: TUBAL LIGATION 02/08/2006: ULNAR NERVE TRANSPOSITION  BMI    Body Mass Index: 35.19 kg/m      Reproductive/Obstetrics negative OB ROS                             Anesthesia Physical Anesthesia Plan  ASA: 3  Anesthesia Plan: General   Post-op Pain Management: Minimal or no pain anticipated   Induction: Intravenous  PONV Risk Score and Plan: 3 and Propofol infusion, TIVA and Ondansetron  Airway Management Planned: Nasal Cannula  Additional Equipment: None  Intra-op Plan:   Post-operative Plan:   Informed Consent: I have reviewed the patients History and Physical, chart, labs and discussed the procedure including the risks, benefits and alternatives for the proposed anesthesia with the patient or authorized representative who has indicated his/her understanding and acceptance.     Dental advisory given  Plan Discussed with: CRNA and Surgeon  Anesthesia Plan Comments: (Discussed risks of anesthesia with patient, including possibility of difficulty with spontaneous ventilation under anesthesia necessitating airway intervention, PONV, and rare risks such as cardiac or respiratory or neurological events, and allergic reactions. Discussed the role of CRNA in patient's perioperative care. Patient understands.)       Anesthesia Quick Evaluation

## 2022-02-15 NOTE — Op Note (Signed)
[facility] Patient Name: Jocelyn Wells Procedure Date: 02/15/2022 9:47 AM MRN: [id number] Account #: [id number] Date of Birth: [birth year] Admit Type: Outpatient Age: 67 Room: [facility] ENDO ROOM 4 Gender: Female Note Status: Finalized Instrument Name: Colonscope [id number] Procedure:             Colonoscopy Indications:           Last colonoscopy: August 2018, Unexplained iron                         deficiency anemia Providers:             Lin Landsman MD, MD Referring MD:          Tally Joe Medicines:             General Anesthesia Complications:         No immediate complications. Estimated blood loss: None. Procedure:             Pre-Anesthesia Assessment:                        - Prior to the procedure, a History and Physical was                         performed, and patient medications and allergies were                         reviewed. The patient is competent. The risks and                         benefits of the procedure and the sedation options and                         risks were discussed with the patient. All questions                         were answered and informed consent was obtained.                         Patient identification and proposed procedure were                         verified by the physician, the nurse, the                         anesthesiologist, the anesthetist and the technician                         in the pre-procedure area in the procedure room in the                         endoscopy suite. Mental Status Examination: alert and                         oriented. Airway Examination: normal oropharyngeal                         airway and neck mobility. Respiratory Examination:  clear to auscultation. CV Examination: normal.                         Prophylactic Antibiotics: The patient does not require                         prophylactic antibiotics. Prior Anticoagulants: The                          patient has taken Plavix (clopidogrel), last dose was                         7 days prior to procedure. ASA Grade Assessment: III -                         A patient with severe systemic disease. After                         reviewing the risks and benefits, the patient was                         deemed in satisfactory condition to undergo the                         procedure. The anesthesia plan was to use general                         anesthesia. Immediately prior to administration of                         medications, the patient was re-assessed for adequacy                         to receive sedatives. The heart rate, respiratory                         rate, oxygen saturations, blood pressure, adequacy of                         pulmonary ventilation, and response to care were                         monitored throughout the procedure. The physical                         status of the patient was re-assessed after the                         procedure.                        After obtaining informed consent, the colonoscope was                         passed under direct vision. Throughout the procedure,                         the patient's blood pressure, pulse, and oxygen  saturations were monitored continuously. The                         Colonoscope was introduced through the anus and                         advanced to the the cecum, identified by appendiceal                         orifice and ileocecal valve. The colonoscopy was                         unusually difficult due to significant looping and the                         patient's body habitus. Successful completion of the                         procedure was aided by applying abdominal pressure.                         The patient tolerated the procedure well. The quality                         of the bowel preparation was good. The ileocecal                          valve, appendiceal orifice, and rectum were                         photographed. Findings:      The perianal and digital rectal examinations were normal. Pertinent       negatives include normal sphincter tone and no palpable rectal lesions.      A 5 mm polyp was found in the ascending colon. The polyp was sessile.       The polyp was removed with a cold snare. Resection and retrieval were       complete. Estimated blood loss: none.      The retroflexed view of the distal rectum and anal verge was normal and       showed no anal or rectal abnormalities.      The exam was otherwise without abnormality. Impression:            - One 5 mm polyp in the ascending colon, removed with                         a cold snare. Resected and retrieved.                        - The distal rectum and anal verge are normal on                         retroflexion view.                        - The examination was otherwise normal. Recommendation:        - Discharge patient to home (with escort).                        -  Diabetic (ADA) diet today.                        - Continue present medications.                        - Resume Eliquis (apixaban) today and Plavix                         (clopidogrel) today at prior doses. Refer to managing                         physician for further adjustment of therapy.                        - Await pathology results.                        - To visualize the small bowel, perform video capsule                         endoscopy at appointment to be scheduled. Procedure Code(s):     --- Professional ---                        [phone], Colonoscopy, flexible; with removal of                         tumor(s), polyp(s), or other lesion(s) by snare                         technique Diagnosis Code(s):     --- Professional ---                        D12.2, Benign neoplasm of ascending colon                        D50.9, Iron deficiency anemia, unspecified CPT  copyright 2022 [employer]. All rights reserved. The codes documented in this report are preliminary and upon coder review may  be revised to meet current compliance requirements. Dr. Ulyess Mort Lin Landsman MD, MD 02/15/2022 10:49:04 AM This report has been signed electronically. Number of Addenda: 0 Note Initiated On: 02/15/2022 9:47 AM Scope Withdrawal Time: 0 hours 11 minutes 14 seconds  Total Procedure Duration: 0 hours 24 minutes 28 seconds  Estimated Blood Loss:  Estimated blood loss: none.      [facility]

## 2022-02-16 ENCOUNTER — Encounter: Payer: Medicare Other | Admitting: Student in an Organized Health Care Education/Training Program

## 2022-02-16 ENCOUNTER — Encounter: Payer: Self-pay | Admitting: Gastroenterology

## 2022-02-16 DIAGNOSIS — I152 Hypertension secondary to endocrine disorders: Secondary | ICD-10-CM | POA: Diagnosis not present

## 2022-02-16 DIAGNOSIS — E1159 Type 2 diabetes mellitus with other circulatory complications: Secondary | ICD-10-CM | POA: Diagnosis not present

## 2022-02-16 DIAGNOSIS — N184 Chronic kidney disease, stage 4 (severe): Secondary | ICD-10-CM | POA: Diagnosis not present

## 2022-02-16 DIAGNOSIS — I5033 Acute on chronic diastolic (congestive) heart failure: Secondary | ICD-10-CM | POA: Diagnosis not present

## 2022-02-16 DIAGNOSIS — D631 Anemia in chronic kidney disease: Secondary | ICD-10-CM | POA: Diagnosis not present

## 2022-02-16 DIAGNOSIS — E1122 Type 2 diabetes mellitus with diabetic chronic kidney disease: Secondary | ICD-10-CM | POA: Diagnosis not present

## 2022-02-16 LAB — SURGICAL PATHOLOGY

## 2022-02-17 ENCOUNTER — Telehealth: Payer: Self-pay

## 2022-02-17 ENCOUNTER — Other Ambulatory Visit: Payer: Self-pay | Admitting: Family

## 2022-02-17 ENCOUNTER — Ambulatory Visit (INDEPENDENT_AMBULATORY_CARE_PROVIDER_SITE_OTHER): Payer: Medicare Other | Admitting: Licensed Clinical Social Worker

## 2022-02-17 DIAGNOSIS — F3341 Major depressive disorder, recurrent, in partial remission: Secondary | ICD-10-CM

## 2022-02-17 DIAGNOSIS — N184 Chronic kidney disease, stage 4 (severe): Secondary | ICD-10-CM | POA: Diagnosis not present

## 2022-02-17 DIAGNOSIS — D631 Anemia in chronic kidney disease: Secondary | ICD-10-CM | POA: Diagnosis not present

## 2022-02-17 DIAGNOSIS — F4310 Post-traumatic stress disorder, unspecified: Secondary | ICD-10-CM

## 2022-02-17 DIAGNOSIS — I5033 Acute on chronic diastolic (congestive) heart failure: Secondary | ICD-10-CM | POA: Diagnosis not present

## 2022-02-17 DIAGNOSIS — E1122 Type 2 diabetes mellitus with diabetic chronic kidney disease: Secondary | ICD-10-CM | POA: Diagnosis not present

## 2022-02-17 DIAGNOSIS — E1159 Type 2 diabetes mellitus with other circulatory complications: Secondary | ICD-10-CM | POA: Diagnosis not present

## 2022-02-17 DIAGNOSIS — I152 Hypertension secondary to endocrine disorders: Secondary | ICD-10-CM | POA: Diagnosis not present

## 2022-02-17 MED ORDER — HYDRALAZINE HCL 25 MG PO TABS: 25.0000 mg | ORAL_TABLET | Freq: Three times a day (TID) | ORAL | 3 refills | Status: DC
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
## 2022-02-17 NOTE — Telephone Encounter (Signed)
Called and left a message for call back  

## 2022-02-17 NOTE — Telephone Encounter (Signed)
-----   Message from Lin Landsman, MD sent at 02/16/2022  2:48 PM EST ----- Please inform patient that the pathology results from upper endoscopy and colonoscopy came back normal.  Recommend video capsule endoscopy as the next step to evaluate for history of iron deficiency anemia  Rohini Vanga

## 2022-02-17 NOTE — Progress Notes (Signed)
Hydralazine added to med list per Ventricle HF MD Abigail Butts)

## 2022-02-18 NOTE — Telephone Encounter (Signed)
Sent mychart message

## 2022-02-18 NOTE — Telephone Encounter (Signed)
Called and left a message for call back. Unable to reach message please advise. Have sent a mychart message

## 2022-02-18 NOTE — Telephone Encounter (Signed)
Called and left a message for call back  

## 2022-02-19 ENCOUNTER — Telehealth: Payer: Self-pay | Admitting: Family Medicine

## 2022-02-19 ENCOUNTER — Other Ambulatory Visit: Payer: Self-pay

## 2022-02-19 ENCOUNTER — Telehealth: Payer: Self-pay

## 2022-02-19 DIAGNOSIS — E1159 Type 2 diabetes mellitus with other circulatory complications: Secondary | ICD-10-CM | POA: Diagnosis not present

## 2022-02-19 DIAGNOSIS — D509 Iron deficiency anemia, unspecified: Secondary | ICD-10-CM

## 2022-02-19 DIAGNOSIS — I152 Hypertension secondary to endocrine disorders: Secondary | ICD-10-CM | POA: Diagnosis not present

## 2022-02-19 DIAGNOSIS — D631 Anemia in chronic kidney disease: Secondary | ICD-10-CM | POA: Diagnosis not present

## 2022-02-19 DIAGNOSIS — I5033 Acute on chronic diastolic (congestive) heart failure: Secondary | ICD-10-CM | POA: Diagnosis not present

## 2022-02-19 DIAGNOSIS — E1122 Type 2 diabetes mellitus with diabetic chronic kidney disease: Secondary | ICD-10-CM | POA: Diagnosis not present

## 2022-02-19 DIAGNOSIS — N184 Chronic kidney disease, stage 4 (severe): Secondary | ICD-10-CM | POA: Diagnosis not present

## 2022-02-19 MED FILL — Iron Sucrose Inj 20 MG/ML (Fe Equiv): INTRAVENOUS | Qty: 10 | Status: AC

## 2022-02-19 NOTE — Telephone Encounter (Signed)
Copied from [place] [id number]. Topic: Quick Communication - Home Health Verbal Orders >> Feb 18, 2022  3:28 PM Ludger Nutting wrote: Home Health Verbal Orders - Caller/Agency: [facility] Number: [phone] Requesting OT/PT/Skilled Nursing/Social Work/Speech Therapy:OT  Frequency: 1x5

## 2022-02-19 NOTE — Telephone Encounter (Signed)
Home Health Verbal Orders - Caller/Agency: [facility] Number: [phone] Requesting OT/PT/Skilled Nursing/Social Work/Speech Therapy: Skilled Nursing  Frequency:   2w9  Wound care of left leg and education of CHF

## 2022-02-19 NOTE — Telephone Encounter (Signed)
Called and left a message for call back  

## 2022-02-19 NOTE — Telephone Encounter (Signed)
Patient called back and left a voicemail on my phone for a call back

## 2022-02-19 NOTE — Telephone Encounter (Signed)
Care Management & Coordination Services Outreach Note  02/19/2022 Name: ANARI EVITT MRN: [id number] DOB: 07-28-1955  Referred by: Gwyneth Sprout, FNP  Patient had a phone appointment scheduled with clinical pharmacist today.  An unsuccessful telephone outreach was attempted today. The patient was referred to the pharmacist for assistance with medications, care management and care coordination.   Patient will NOT be penalized in any way for missing a Care Management & Coordination Services appointment. The no-show fee does not apply.  Junius Argyle, PharmD, Para [place], CPP  Clinical Pharmacist Practitioner  [facility] [phone]

## 2022-02-19 NOTE — Progress Notes (Unsigned)
Care Management & Coordination Services Pharmacy Note  02/19/2022 Name:  Jocelyn Wells MRN:  [id number] DOB:  August 06, 1955  Summary: ***  Recommendations/Changes made from today's visit: ***  Follow up plan: ***   Subjective: Jocelyn Wells is an 67 y.o. year old female who is a primary patient of Gwyneth Sprout, FNP.  The care coordination team was consulted for assistance with disease management and care coordination needs.    Engaged with patient by telephone for follow up visit.  Recent office visits: 01/18/22: Patient presented to Tally Joe, Fnp for follow-up.  09/02/2021 Tally Joe FNP (PCP) No Medication Changes noted, Return in about 3 months   Recent consult visits: 02/11/22: Patient presented to Gerrie Nordmann, NP (Cardiology)  02/10/22: Patient presented to Dr. Candiss Norse (Nephrology) for follow-up.  02/03/22: Patient presented to Darylene Price, [place] (Cardiology)  01/29/22: Patient presented to Dr. Patsey Berthold (Pulmonology)  01/28/22: Patient presented to Dr. Ronalee Belts (Vascular)   10/15/21: Patient presented to Dr. Ronalee Belts (vascular).  10/14/2021 Dr. Lanora Manis MD (Nephrology) No Medication Changes noted 10/07/2021 Dr. Modesta Messing MD ([place]) Unable to see note 10/06/21: Patient presented to Dr. Patsey Berthold (pulmonology)  Hospital visits: {Hospital DC Yes/No:25215}   Objective:  Lab Results  Component Value Date   CREATININE 3.40 (H) 02/11/2022   BUN 52 (H) 02/11/2022   EGFR 20 (L) 12/04/2021   GFRNONAA 14 (L) 02/11/2022   GFRAA 33 (L) 04/23/2019   NA 139 02/11/2022   K 4.0 02/11/2022   CALCIUM 8.6 (L) 02/11/2022   CO2 29 02/11/2022   GLUCOSE 193 (H) 02/11/2022    Lab Results  Component Value Date/Time   HGBA1C 10.2 (H) 11/23/2021 03:53 PM   HGBA1C 7.9 (A) 07/14/2021 03:44 PM   HGBA1C 10.3 09/02/2020 12:00 AM   HGBA1C 10.2 (H) 05/31/2020 05:32 AM   HGBA1C 11.4 03/28/2019 12:00 AM   MICROALBUR 34 05/19/2016 12:00 AM    Last diabetic Eye exam:  Lab Results   Component Value Date/Time   HMDIABEYEEXA Retinopathy (A) 07/15/2020 02:18 PM    Last diabetic Foot exam: No results found for: "HMDIABFOOTEX"   Lab Results  Component Value Date   CHOL 107 12/04/2021   HDL 36 (L) 12/04/2021   LDLCALC 50 12/04/2021   TRIG 112 12/04/2021   CHOLHDL 3.0 12/04/2021       Latest Ref Rng & Units 12/15/2021    3:28 PM 12/07/2021    5:44 PM 12/04/2021    3:42 PM  Hepatic Function  Total Protein 6.5 - 8.1 g/dL 6.4  6.8  6.0   Albumin 3.5 - 5.0 g/dL 3.1  3.2  3.6   AST 15 - 41 U/L '13  12  13   '$ ALT 0 - 44 U/L '10  10  11   '$ Alk Phosphatase 38 - 126 U/L 81  92  106   Total Bilirubin 0.3 - 1.2 mg/dL 0.4  0.5  <0.2     Lab Results  Component Value Date/Time   TSH 2.977 10/06/2021 12:38 PM       Latest Ref Rng & Units 01/26/2022   11:06 AM 01/21/2022   12:48 PM 01/04/2022    4:34 AM  CBC  WBC 4.0 - 10.5 K/uL 9.2  13.2  13.3   Hemoglobin 12.0 - 15.0 g/dL 9.2  9.1  8.6   Hematocrit 36.0 - 46.0 % 32.7  33.1  29.7   Platelets 150 - 400 K/uL 485  570  398     Lab Results  Component Value Date/Time   FYBOFBPZ02 310 12/15/2021 03:28 PM    Clinical ASCVD: {YES/NO:21197} The ASCVD Risk score (Arnett DK, et al., 2019) failed to calculate for the following reasons:   The patient has a prior MI or stroke diagnosis    ***Other: (CHADS2VASc if Afib, MMRC or CAT for COPD, ACT, DEXA)     02/17/2022    2:53 PM 01/18/2022    3:11 PM 12/11/2021    5:19 PM  Depression screen PHQ 2/9  Decreased Interest 1 0 1  Down, Depressed, Hopeless 0 0 1  PHQ - 2 Score 1 0 2  Altered sleeping 1 3   Tired, decreased energy 1 3   Change in appetite 1 0   Feeling bad or failure about yourself  0 0   Trouble concentrating 0 0   Moving slowly or fidgety/restless 0 0   Suicidal thoughts 0 0   PHQ-9 Score 4 6   Difficult doing work/chores Somewhat difficult Not difficult at all      Social History   Tobacco Use  Smoking Status Some Days   Packs/day: 0.50    Years: 47.00   Total pack years: 23.50   Types: Cigarettes   Start date: 02/08/1974  Smokeless Tobacco Never  Tobacco Comments   Quit 01/18/2022   BP Readings from Last 3 Encounters:  02/15/22 120/60  02/11/22 96/61  02/03/22 (!) 127/56   Pulse Readings from Last 3 Encounters:  02/15/22 62  02/11/22 63  02/03/22 71   Wt Readings from Last 3 Encounters:  02/15/22 218 lb (98.9 kg)  02/11/22 218 lb (98.9 kg)  02/03/22 223 lb (101.2 kg)   BMI Readings from Last 3 Encounters:  02/15/22 35.19 kg/m  02/11/22 35.19 kg/m  02/03/22 35.99 kg/m    Allergies  Allergen Reactions   Codeine Nausea And Vomiting   Ivp Dye [Iodinated Contrast Media]     Patients states the IV Dye shuts her kidneys down    Medications Reviewed Today     Reviewed by Debe Coder, RN (Registered Nurse) on 02/15/22 at [place] List Status: <None>   Medication Order Taking? Sig Documenting Provider Last Dose Status Informant  albuterol (VENTOLIN HFA) 108 (90 Base) MCG/ACT inhaler [id number]  TAKE 2 PUFFS BY MOUTH EVERY 6 HOURS AS NEEDED FOR WHEEZE OR SHORTNESS OF BREATH  Patient taking differently: Inhale 2 puffs into the lungs every 6 (six) hours as needed for wheezing or shortness of breath.   Gwyneth Sprout, FNP  Active Spouse/Significant Other  apixaban (ELIQUIS) 5 MG TABS tablet [id number] No Take 1 tablet (5 mg total) by mouth 2 (two) times daily. Gerrie Nordmann, NP 02/07/2022 Active   atorvastatin (LIPITOR) 40 MG tablet [id number] Yes Take 1 tablet (40 mg total) by mouth daily. Gwyneth Sprout, FNP 02/14/2022 Active Spouse/Significant Other  buPROPion (WELLBUTRIN SR) 150 MG 12 hr tablet [id number] Yes Take 1 tablet (150 mg total) by mouth 2 (two) times daily. Tally Joe T, FNP 02/14/2022 Active   carvedilol (COREG) 25 MG tablet [id number] Yes Take 1 tablet (25 mg total) by mouth 2 (two) times daily with a meal. Shelly Coss, MD 02/14/2022 Active   clopidogrel (PLAVIX) 75 MG tablet [id number] No Take 1  tablet (75 mg total) by mouth daily. Gerrie Nordmann, NP 02/07/2022 Active Spouse/Significant Other  Dulaglutide 3 MG/0.5ML SOPN [id number] No Inject into the skin once a week. [provider] 02/01/2022 Active Spouse/Significant Other  Med Note [facility], RAQUEL   Wed Dec 16, 2021  2:47 PM) On Mondays  fluticasone (FLONASE) 50 MCG/ACT nasal spray [id number]  Place 2 sprays into both nostrils daily. Gwyneth Sprout, FNP  Active Spouse/Significant Other  Fluticasone-Umeclidin-Vilant (TRELEGY ELLIPTA) 100-62.5-25 MCG/ACT AEPB [id number]  Inhale 1 puff into the lungs daily. Tyler Pita, MD  Active   Fluticasone-Umeclidin-Vilant [facility] ELLIPTA) 100-62.5-25 MCG/ACT AEPB [id number]  Inhale 1 puff into the lungs daily.  Patient not taking: Reported on 02/03/2022   Tyler Pita, MD  Active   gabapentin (NEURONTIN) 300 MG capsule [id number] Yes Take 300 mg by mouth 2 (two) times daily. [provider] 02/14/2022 Active Spouse/Significant Other  glucose blood test strip [id number]  OneTouch Verio strips [provider]  Active Spouse/Significant Other  insulin aspart (NOVOLOG) 100 UNIT/ML injection [id number]  Insulin pump [provider]  Active Spouse/Significant Other  Insulin Disposable Pump (OMNIPOD DASH PODS, GEN 4,) MISC [id number] Yes USE 1 POD EACH EVERY 77    HOURS [provider] 02/14/2022 Active Spouse/Significant Other  Insulin Human (INSULIN PUMP) SOLN [id number]  Per endocrinoloy Elgergawy, Silver Huguenin, MD  Active Spouse/Significant Other  loratadine (CLARITIN) 10 MG tablet [id number] Yes Take 1 tablet by mouth daily. [provider] 02/14/2022 Active Spouse/Significant Other           Med Note [facility], RACHEL A   Fri Mar 21, 2015 10:46 AM)    meclizine (ANTIVERT) 12.5 MG tablet [id number]  Take 1 tablet (12.5 mg total) by mouth 3 (three) times daily as needed for dizziness. Myles Gip, DO  Active Spouse/Significant Other   OXYGEN [id number]  Place 2 L into the nose continuous. [provider]  Active Self  pantoprazole (PROTONIX) 40 MG tablet [id number] Yes Take 1 tablet (40 mg total) by mouth 2 (two) times daily. Sharen Hones, MD 02/14/2022 Active Spouse/Significant Other  PARoxetine (PAXIL) 40 MG tablet [id number] Yes Take 1 tablet (40 mg total) by mouth daily. Norman Clay, MD 02/14/2022 Active Spouse/Significant Other  sacubitril-valsartan (ENTRESTO) 49-51 MG [id number] Yes Take 1 tablet by mouth 2 (two) times daily. Bensimhon, Shaune Pascal, MD 02/14/2022 Active   Spacer/Aero-Holding Josiah Lobo ([place]) MISC [id number]  Use with inhaler to ensure medication is delivered throughout lungs Tally Joe T, FNP  Active Spouse/Significant Other  torsemide (DEMADEX) 20 MG tablet [id number]  Take 1 tablet (20 mg total) by mouth every Monday, Wednesday, and Friday. Gerrie Nordmann, NP  Active   traMADol (ULTRAM) 50 MG tablet [id number] Yes Take 1 tablet (50 mg total) by mouth every 12 (twelve) hours as needed. Gillis Santa, MD 02/14/2022 Active Spouse/Significant Other  Vitamin D, Ergocalciferol, (DRISDOL) 1.25 MG (50000 UNIT) CAPS capsule [id number]  Take 50,000 Units by mouth every 7 (seven) days. [provider]  Active   Med List Note Janett Billow, RN 11/03/21 1349): MR 03/03/22 UDS 11/03/21            SDOH:  (Social Determinants of Health) assessments and interventions performed: {yes/no:20286} SDOH Interventions    Flowsheet Row Counselor from 02/17/2022 in confidential department Office Visit from 01/18/2022 in [facility] Telephone from 01/05/2022 in [place] Coordination Office Visit from 12/15/2021 in [place] [facility] [place] Management from 12/11/2021 in [place] Management from 09/08/2021 in [place] Interventions        Food Insecurity Interventions -- --  Intervention Not Indicated -- Intervention Not Indicated --  Housing Interventions -- -- Intervention Not Indicated -- Intervention Not Indicated --  Transportation Interventions -- -- Intervention Not Indicated Intervention Not Indicated Intervention Not Indicated Intervention Not Indicated  Utilities Interventions -- -- -- -- Intervention Not Indicated --  Depression Interventions/Treatment  Counseling Medication -- -- PHQ2-9 Score <4 Follow-up Not Indicated --  Financial Strain Interventions -- -- -- -- Intervention Not Indicated Intervention Not Indicated  Physical Activity Interventions -- -- -- -- Other (Comments)  [Currently receiving in home therapy twice a week.] --  Stress Interventions -- -- -- -- Other (Comment)  [Reports currently managing well/Declined current need for counseing] --  Social Connections Interventions -- -- -- -- Intervention Not Indicated --      SDOH Screenings   Food Insecurity: No Food Insecurity (01/05/2022)  Housing: Low Risk  (01/05/2022)  Transportation Needs: No Transportation Needs (01/05/2022)  Utilities: Not At Risk (01/02/2022)  Alcohol Screen: Low Risk  (12/04/2021)  Depression (PHQ2-9): Low Risk  (02/17/2022)  Recent Concern: Depression (PHQ2-9) - Medium Risk (01/18/2022)  Financial Resource Strain: Low Risk  (12/11/2021)  Physical Activity: Inactive (12/11/2021)  Social Connections: Moderately Isolated (12/11/2021)  Stress: Stress Concern Present (12/11/2021)  Tobacco Use: High Risk (02/16/2022)    Medication Assistance: {MEDASSISTANCEINFO:25044}  Medication Access: Within the past 30 days, how often has patient missed a dose of medication? *** Is a pillbox or other method used to improve adherence? {YES/NO:21197} Factors that may affect medication adherence? {CHL DESC; BARRIERS:21522} Are meds synced by current pharmacy? {YES/NO:21197} Are meds delivered by current pharmacy? {YES/NO:21197} Does patient experience delays in picking up  medications due to transportation concerns? {YES/NO:21197}  [facility] Services Reviewed: Is patient disadvantaged to use [facility]?: {YES/NO:21197} Current Rx insurance plan: *** Name and location of Current pharmacy:  [id number] [address] [facility] [place]Phone: [phone]: [phone] [facility] [address]Phone: [phone]: [phone] [facility] services reviewed with patient today?: {YES/NO:21197} Patient requests to transfer care to [facility]?: {YES/NO:21197} Reason patient declined to change pharmacies: {US patient preference:27474}  Compliance/Adherence/Medication fill history: Care Gaps: ***  Star-Rating Drugs: ***   Assessment/Plan  Heart Failure (Goal: manage symptoms and prevent exacerbations) -Controlled -Last ejection fraction: 45-50% (Date: 2019) -HF type: HF w/recovered EF  -NYHA Class: II (slight limitation of activity) -Current treatment: Carvedilol 25 mg twice daily  Entresto 49-51 mg twice daily  Hydralazine 25 mg three times daily  Torsemide 20 mg Mon/Wed/Fri -Medications previously tried: HCTZ, Losartan (AKI), Metoprolol, Spironolactone  -Current home BP/HR readings: *** -Current home daily weights: *** -Recommended to continue current medication    Hyperlipidemia: (LDL goal < 55) -Controlled -Stenting July 5th  -Current treatment: Atorvastatin 40 mg daily  -Current treatment: Clopidogrel 75 mg daily  -Medications previously tried: NA  -Recommended to continue current medication  Diabetes (A1c goal <8%) -Uncontrolled -Managed by Dr. SGabriel Carina -Current medications: Trulicity 3 mg weekly on Mondays  Novolog via insulin pump  -Medications previously tried: Ozempic (nausea), glyburide/metformin (CKD),   -Current  home glucose readings (uses freestyle [place]  -Denies hypoglycemic/hyperglycemic symptoms -Recommended to continue current medication  COPD (Goal: control symptoms and prevent exacerbations) -Controlled -Current treatment  Albuterol 2 puffs every 6 hours as needed  Trelegy 1 puff daily  Flonase  Loratadine 10 mg daily  -Medications previously tried: NA  -  Frequency of rescue inhaler use: <1x in past month.  -Recommended to continue current medication  Depression/Anxiety (Goal: Maintain symptom remission) -Controlled -Current treatment: Bupropion SR 150 mg twice daily  Paroxetine 40 mg daily  -Medications previously tried/failed: Aripiprazole  -Recommended to continue current medication  Chronic DVTs (Goal: Prevent clots) -Controlled -Current treatment  Eliquis 5 mg twice daily  -Medications previously tried: NA  -Recommended to continue current medication  Chronic Kidney Disease Stage 4  -All medications assessed for renal dosing and appropriateness in chronic kidney disease. -told by Dr. Lanora Manis 4 glasses of water daily.  -Drinks unsweetened tea 4-5 glasses. 2-3 of water.   -Recommended to continue current medication  ***

## 2022-02-19 NOTE — Telephone Encounter (Signed)
Spoke to [place] gave verbal orders. She verbalized understanding.  KP

## 2022-02-19 NOTE — Telephone Encounter (Signed)
Patient verbalized understanding of results. She is okay with scheduling capsule study. Got patient schedule for capsule study on 03/09/2022. Went over instructions, mailed them and sent to [facility] account

## 2022-02-20 DIAGNOSIS — I452 Bifascicular block: Secondary | ICD-10-CM | POA: Diagnosis not present

## 2022-02-20 DIAGNOSIS — I70248 Atherosclerosis of native arteries of left leg with ulceration of other part of lower left leg: Secondary | ICD-10-CM | POA: Diagnosis not present

## 2022-02-20 DIAGNOSIS — I70201 Unspecified atherosclerosis of native arteries of extremities, right leg: Secondary | ICD-10-CM | POA: Diagnosis not present

## 2022-02-20 DIAGNOSIS — J449 Chronic obstructive pulmonary disease, unspecified: Secondary | ICD-10-CM | POA: Diagnosis not present

## 2022-02-20 DIAGNOSIS — D631 Anemia in chronic kidney disease: Secondary | ICD-10-CM | POA: Diagnosis not present

## 2022-02-20 DIAGNOSIS — M1711 Unilateral primary osteoarthritis, right knee: Secondary | ICD-10-CM | POA: Diagnosis not present

## 2022-02-20 DIAGNOSIS — E1159 Type 2 diabetes mellitus with other circulatory complications: Secondary | ICD-10-CM | POA: Diagnosis not present

## 2022-02-20 DIAGNOSIS — E1122 Type 2 diabetes mellitus with diabetic chronic kidney disease: Secondary | ICD-10-CM | POA: Diagnosis not present

## 2022-02-20 DIAGNOSIS — F419 Anxiety disorder, unspecified: Secondary | ICD-10-CM | POA: Diagnosis not present

## 2022-02-20 DIAGNOSIS — I152 Hypertension secondary to endocrine disorders: Secondary | ICD-10-CM | POA: Diagnosis not present

## 2022-02-20 DIAGNOSIS — D72829 Elevated white blood cell count, unspecified: Secondary | ICD-10-CM | POA: Diagnosis not present

## 2022-02-20 DIAGNOSIS — E1165 Type 2 diabetes mellitus with hyperglycemia: Secondary | ICD-10-CM | POA: Diagnosis not present

## 2022-02-20 DIAGNOSIS — J9621 Acute and chronic respiratory failure with hypoxia: Secondary | ICD-10-CM | POA: Diagnosis not present

## 2022-02-20 DIAGNOSIS — M810 Age-related osteoporosis without current pathological fracture: Secondary | ICD-10-CM | POA: Diagnosis not present

## 2022-02-20 DIAGNOSIS — E1140 Type 2 diabetes mellitus with diabetic neuropathy, unspecified: Secondary | ICD-10-CM | POA: Diagnosis not present

## 2022-02-20 DIAGNOSIS — N184 Chronic kidney disease, stage 4 (severe): Secondary | ICD-10-CM | POA: Diagnosis not present

## 2022-02-20 DIAGNOSIS — I429 Cardiomyopathy, unspecified: Secondary | ICD-10-CM | POA: Diagnosis not present

## 2022-02-20 DIAGNOSIS — M48061 Spinal stenosis, lumbar region without neurogenic claudication: Secondary | ICD-10-CM | POA: Diagnosis not present

## 2022-02-20 DIAGNOSIS — E1151 Type 2 diabetes mellitus with diabetic peripheral angiopathy without gangrene: Secondary | ICD-10-CM | POA: Diagnosis not present

## 2022-02-20 DIAGNOSIS — I480 Paroxysmal atrial fibrillation: Secondary | ICD-10-CM | POA: Diagnosis not present

## 2022-02-20 DIAGNOSIS — D509 Iron deficiency anemia, unspecified: Secondary | ICD-10-CM | POA: Diagnosis not present

## 2022-02-20 DIAGNOSIS — I5032 Chronic diastolic (congestive) heart failure: Secondary | ICD-10-CM | POA: Diagnosis not present

## 2022-02-20 DIAGNOSIS — F330 Major depressive disorder, recurrent, mild: Secondary | ICD-10-CM | POA: Diagnosis not present

## 2022-02-20 DIAGNOSIS — L97828 Non-pressure chronic ulcer of other part of left lower leg with other specified severity: Secondary | ICD-10-CM | POA: Diagnosis not present

## 2022-02-22 ENCOUNTER — Inpatient Hospital Stay: Payer: Medicare Other

## 2022-02-22 ENCOUNTER — Encounter: Payer: Self-pay | Admitting: Oncology

## 2022-02-22 NOTE — Telephone Encounter (Signed)
Patient advised.

## 2022-02-23 DIAGNOSIS — E1159 Type 2 diabetes mellitus with other circulatory complications: Secondary | ICD-10-CM | POA: Diagnosis not present

## 2022-02-23 DIAGNOSIS — N184 Chronic kidney disease, stage 4 (severe): Secondary | ICD-10-CM | POA: Diagnosis not present

## 2022-02-23 DIAGNOSIS — I152 Hypertension secondary to endocrine disorders: Secondary | ICD-10-CM | POA: Diagnosis not present

## 2022-02-23 DIAGNOSIS — E1122 Type 2 diabetes mellitus with diabetic chronic kidney disease: Secondary | ICD-10-CM | POA: Diagnosis not present

## 2022-02-23 DIAGNOSIS — I5032 Chronic diastolic (congestive) heart failure: Secondary | ICD-10-CM | POA: Diagnosis not present

## 2022-02-23 DIAGNOSIS — D631 Anemia in chronic kidney disease: Secondary | ICD-10-CM | POA: Diagnosis not present

## 2022-02-24 ENCOUNTER — Inpatient Hospital Stay: Payer: Medicare Other | Attending: Oncology

## 2022-02-24 ENCOUNTER — Ambulatory Visit
Admission: RE | Admit: 2022-02-24 | Discharge: 2022-02-24 | Disposition: A | Discharge status: Home / Self Care | Payer: Medicare Other | Source: Ambulatory Visit | Attending: Pulmonary Disease | Admitting: Pulmonary Disease

## 2022-02-24 DIAGNOSIS — I5032 Chronic diastolic (congestive) heart failure: Secondary | ICD-10-CM

## 2022-02-24 DIAGNOSIS — R0600 Dyspnea, unspecified: Secondary | ICD-10-CM | POA: Diagnosis not present

## 2022-02-24 DIAGNOSIS — I509 Heart failure, unspecified: Secondary | ICD-10-CM | POA: Diagnosis not present

## 2022-02-24 DIAGNOSIS — D508 Other iron deficiency anemias: Secondary | ICD-10-CM

## 2022-02-24 DIAGNOSIS — R0689 Other abnormalities of breathing: Secondary | ICD-10-CM

## 2022-02-24 DIAGNOSIS — I110 Hypertensive heart disease with heart failure: Secondary | ICD-10-CM | POA: Insufficient documentation

## 2022-02-24 DIAGNOSIS — E119 Type 2 diabetes mellitus without complications: Secondary | ICD-10-CM | POA: Diagnosis not present

## 2022-02-24 DIAGNOSIS — D509 Iron deficiency anemia, unspecified: Secondary | ICD-10-CM | POA: Diagnosis not present

## 2022-02-24 DIAGNOSIS — I10 Essential (primary) hypertension: Secondary | ICD-10-CM | POA: Diagnosis not present

## 2022-02-24 DIAGNOSIS — E785 Hyperlipidemia, unspecified: Secondary | ICD-10-CM | POA: Diagnosis not present

## 2022-02-24 DIAGNOSIS — N189 Chronic kidney disease, unspecified: Secondary | ICD-10-CM | POA: Diagnosis not present

## 2022-02-24 DIAGNOSIS — J449 Chronic obstructive pulmonary disease, unspecified: Secondary | ICD-10-CM | POA: Insufficient documentation

## 2022-02-24 LAB — ECHOCARDIOGRAM LIMITED BUBBLE STUDY: S' Lateral: 3.3 cm

## 2022-02-24 LAB — IRON AND TIBC
Iron: 40 ug/dL (ref 28–170)
Saturation Ratios: 12 % (ref 10.4–31.8)
TIBC: 330 ug/dL (ref 250–450)
UIBC: 290 ug/dL

## 2022-02-24 LAB — CBC WITH DIFFERENTIAL/PLATELET
Abs Immature Granulocytes: 0.03 10*3/uL (ref 0.00–0.07)
Basophils Absolute: 0.1 10*3/uL (ref 0.0–0.1)
Basophils Relative: 1 %
Eosinophils Absolute: 0.7 10*3/uL — ABNORMAL HIGH (ref 0.0–0.5)
Eosinophils Relative: 7 %
HCT: 41.6 % (ref 36.0–46.0)
Hemoglobin: 11.9 g/dL — ABNORMAL LOW (ref 12.0–15.0)
Immature Granulocytes: 0 %
Lymphocytes Relative: 14 %
Lymphs Abs: 1.5 10*3/uL (ref 0.7–4.0)
MCH: 24.5 pg — ABNORMAL LOW (ref 26.0–34.0)
MCHC: 28.6 g/dL — ABNORMAL LOW (ref 30.0–36.0)
MCV: 85.6 fL (ref 80.0–100.0)
Monocytes Absolute: 0.7 10*3/uL (ref 0.1–1.0)
Monocytes Relative: 7 %
Neutro Abs: 7.7 10*3/uL (ref 1.7–7.7)
Neutrophils Relative %: 71 %
Platelets: 589 10*3/uL — ABNORMAL HIGH (ref 150–400)
RBC: 4.86 MIL/uL (ref 3.87–5.11)
RDW: 21.8 % — ABNORMAL HIGH (ref 11.5–15.5)
WBC: 10.8 10*3/uL — ABNORMAL HIGH (ref 4.0–10.5)
nRBC: 0 % (ref 0.0–0.2)

## 2022-02-24 LAB — FERRITIN: Ferritin: 38 ng/mL (ref 11–307)

## 2022-02-24 NOTE — Progress Notes (Signed)
*  PRELIMINARY RESULTS* Echocardiogram 2D Echocardiogram has been performed.  Jocelyn Wells 02/24/2022, 11:27 AM

## 2022-02-26 ENCOUNTER — Emergency Department
Admission: EM | Admit: 2022-02-26 | Discharge: 2022-02-26 | Disposition: A | Discharge status: Home / Self Care | Payer: Medicare Other | Attending: Emergency Medicine | Admitting: Emergency Medicine

## 2022-02-26 DIAGNOSIS — N189 Chronic kidney disease, unspecified: Secondary | ICD-10-CM

## 2022-02-26 DIAGNOSIS — E1122 Type 2 diabetes mellitus with diabetic chronic kidney disease: Secondary | ICD-10-CM | POA: Insufficient documentation

## 2022-02-26 DIAGNOSIS — I130 Hypertensive heart and chronic kidney disease with heart failure and stage 1 through stage 4 chronic kidney disease, or unspecified chronic kidney disease: Secondary | ICD-10-CM | POA: Insufficient documentation

## 2022-02-26 DIAGNOSIS — I5032 Chronic diastolic (congestive) heart failure: Secondary | ICD-10-CM | POA: Insufficient documentation

## 2022-02-26 DIAGNOSIS — I129 Hypertensive chronic kidney disease with stage 1 through stage 4 chronic kidney disease, or unspecified chronic kidney disease: Secondary | ICD-10-CM | POA: Diagnosis not present

## 2022-02-26 DIAGNOSIS — J449 Chronic obstructive pulmonary disease, unspecified: Secondary | ICD-10-CM | POA: Diagnosis not present

## 2022-02-26 DIAGNOSIS — I152 Hypertension secondary to endocrine disorders: Secondary | ICD-10-CM | POA: Diagnosis not present

## 2022-02-26 DIAGNOSIS — E875 Hyperkalemia: Secondary | ICD-10-CM | POA: Diagnosis not present

## 2022-02-26 DIAGNOSIS — R799 Abnormal finding of blood chemistry, unspecified: Secondary | ICD-10-CM | POA: Diagnosis present

## 2022-02-26 DIAGNOSIS — N184 Chronic kidney disease, stage 4 (severe): Secondary | ICD-10-CM | POA: Diagnosis not present

## 2022-02-26 DIAGNOSIS — E1159 Type 2 diabetes mellitus with other circulatory complications: Secondary | ICD-10-CM | POA: Diagnosis not present

## 2022-02-26 DIAGNOSIS — D631 Anemia in chronic kidney disease: Secondary | ICD-10-CM | POA: Diagnosis not present

## 2022-02-26 LAB — BASIC METABOLIC PANEL
Anion gap: 7 (ref 5–15)
BUN: 52 mg/dL — ABNORMAL HIGH (ref 8–23)
CO2: 26 mmol/L (ref 22–32)
Calcium: 9 mg/dL (ref 8.9–10.3)
Chloride: 102 mmol/L (ref 98–111)
Creatinine, Ser: 3.27 mg/dL — ABNORMAL HIGH (ref 0.44–1.00)
GFR, Estimated: 15 mL/min — ABNORMAL LOW (ref >60)
Glucose, Bld: 299 mg/dL — ABNORMAL HIGH (ref 70–99)
Potassium: 5 mmol/L (ref 3.5–5.1)
Sodium: 135 mmol/L (ref 135–145)

## 2022-02-26 LAB — CBC
HCT: 41.9 % (ref 36.0–46.0)
Hemoglobin: 12.2 g/dL (ref 12.0–15.0)
MCH: 24.6 pg — ABNORMAL LOW (ref 26.0–34.0)
MCHC: 29.1 g/dL — ABNORMAL LOW (ref 30.0–36.0)
MCV: 84.6 fL (ref 80.0–100.0)
Platelets: 613 10*3/uL — ABNORMAL HIGH (ref 150–400)
RBC: 4.95 MIL/uL (ref 3.87–5.11)
RDW: 21.2 % — ABNORMAL HIGH (ref 11.5–15.5)
WBC: 12.1 10*3/uL — ABNORMAL HIGH (ref 4.0–10.5)
nRBC: 0 % (ref 0.0–0.2)

## 2022-02-26 NOTE — Discharge Instructions (Addendum)
Your potassium was at the high end of normal today.  Please review the information about a low potassium diet.  Please follow-up with either your primary doctor or your kidney doctor to have this repeated in about 1 week.

## 2022-02-26 NOTE — ED Provider Notes (Signed)
[facility] Provider Note    Event Date/Time   First MD Initiated Contact with Patient 02/26/22 1808     (approximate)   History   Abnormal Labs   HPI  Jocelyn Wells is a 67 y.o. female past medical history of diastolic heart failure, CKD hypertension diabetes who presents because of elevated potassium on outpatient labs.  Patient had routine labs done and was called and told her potassium was 6.9 she should come to the emergency department.  She has no symptoms denies chest pain dyspnea nausea vomiting diarrhea.  Otherwise feels well.  Patient follows with Dr. Candiss Norse with nephrology.     Past Medical History:  Diagnosis Date   Anemia    Anxiety    Cardiomyopathy [facility])    new to her Jan 2017   CHF (congestive heart failure) ([place])    Chronic kidney disease    COPD (chronic obstructive pulmonary disease) (HCC)    Depression    Diabetes mellitus, type II ([place])    HTN (hypertension)    Leukocytosis    Obesity    Osteoporosis    PONV (postoperative nausea and vomiting)    Stroke [facility])    Jan 2017    Patient Active Problem List   Diagnosis Date Noted   Polyp of ascending colon 02/15/2022   Symptomatic anemia 01/01/2022   Foot pain, right 12/22/2021   Pain of right lower extremity 12/22/2021   Iron deficiency anemia 12/17/2021   Personal history of nicotine dependence 12/04/2021   Leukocytosis 12/04/2021   Long toenail 12/04/2021   Screening for colon cancer 12/04/2021   Depression, recurrent ([place]) 12/04/2021   Hospital discharge follow-up 09/02/2021   Hyperkalemia 08/15/2021   Acute on chronic diastolic CHF (congestive heart failure) ([place]) 08/14/2021   Atherosclerosis of native arteries of the extremities with ulceration ([place]) 08/12/2021   Positive depression screening 07/14/2021   DOE (dyspnea on exertion) 07/14/2021   Bilateral leg edema 07/14/2021   Frequent falls 04/13/2021   Anemia in chronic kidney disease 03/18/2021   Heart  failure, unspecified ([place]) 03/18/2021   Hyperparathyroidism due to renal insufficiency ([place]) 03/18/2021   Chronic depression 03/18/2021   Overweight 03/18/2021   Poor appetite 02/13/2021   MDD (major depressive disorder), recurrent episode, mild ([place]) 02/13/2021   Benign hypertensive kidney disease with chronic kidney disease 10/14/2020   CKD (chronic kidney disease), stage IV ([place]) 05/31/2020   Compression fracture of L1 lumbar vertebra ([place]) 01/30/2019   Compression fracture of body of thoracic vertebra ([place]) 01/30/2019   Lumbar foraminal stenosis (RIGHT L1) 01/30/2019   Spinal stenosis of lumbar region with neurogenic claudication (L4-L5) 01/30/2019   Osteoporosis, post-menopausal 04/14/2018   Vitamin D deficiency 04/14/2018   Chronic deep vein thrombosis (DVT) ([place]) 02/09/2018   Post-menopausal 11/17/2017   Aortic atherosclerosis ([place]) 11/08/2017   Snoring 04/24/2015   Sleep paralysis, recurrent isolated 04/24/2015   Hypersomnia with sleep apnea 04/24/2015   Cataplexy 04/24/2015   Morbid obesity due to excess calories ([place]) 04/24/2015   COPD (chronic obstructive pulmonary disease) ([place]) 47/82/9562   Embolic stroke ([place]) 13/09/6576   Acute renal failure superimposed on stage 4 chronic kidney disease ([place]) 01/29/2015   Dizziness 07/05/2014   Essential hypertension 07/05/2014   Carbuncle and furuncle 07/05/2014   Pain of perianal area 07/05/2014   Guttate psoriasis 07/05/2014   H/O: osteoarthritis 06/05/2014   Anxiety, generalized 06/05/2014   Microalbuminuria 12/23/2013   Long term current use of insulin ([place]) 12/23/2013   Type  2 diabetes mellitus with other diabetic kidney complication ([place]) 19/14/7829   Disturbance of skin sensation 09/02/2006   Difficulty hearing 07/25/2006   Cephalalgia 05/27/2006   Hyperlipidemia associated with type 2 diabetes mellitus ([place]) 10/22/2005   Arthritis, degenerative 10/22/2005     Physical Exam  Triage Vital Signs: ED Triage Vitals   Enc Vitals Group     BP 02/26/22 1619 137/79     Pulse Rate 02/26/22 1619 74     Resp 02/26/22 1619 18     Temp 02/26/22 1619 97.7 F (36.5 C)     Temp Source 02/26/22 1619 Oral     SpO2 02/26/22 1619 93 %     Weight 02/26/22 1620 215 lb (97.5 kg)     Height --      Head Circumference --      Peak Flow --      Pain Score 02/26/22 1620 0     Pain Loc --      Pain Edu? --      Excl. in [place]? --     Most recent vital signs: Vitals:   02/26/22 1619  BP: 137/79  Pulse: 74  Resp: 18  Temp: 97.7 F (36.5 C)  SpO2: 93%     General: Awake, no distress.  CV:  Good peripheral perfusion.  Resp:  Normal effort.  Abd:  No distention.  Neuro:             Awake, Alert, Oriented x 3  Other:     ED Results / Procedures / Treatments  Labs (all labs ordered are listed, but only abnormal results are displayed) Labs Reviewed  BASIC METABOLIC PANEL - Abnormal; Notable for the following components:      Result Value   Glucose, Bld 299 (*)    BUN 52 (*)    Creatinine, Ser 3.27 (*)    GFR, Estimated 15 (*)    All other components within normal limits  CBC - Abnormal; Notable for the following components:   WBC 12.1 (*)    MCH 24.6 (*)    MCHC 29.1 (*)    RDW 21.2 (*)    Platelets 613 (*)    All other components within normal limits  URINALYSIS, ROUTINE W REFLEX MICROSCOPIC     EKG     RADIOLOGY    PROCEDURES:  Critical Care performed: No  Procedures   MEDICATIONS ORDERED IN ED: Medications - No data to display   IMPRESSION / MDM / [place] / ED COURSE  I reviewed the triage vital signs and the nursing notes.                              Patient's presentation is most consistent with acute, uncomplicated illness.  Differential diagnosis includes, but is not limited to, pseudohyperkalemia, AKI on CKD, medication side effect  The patient is a 67 year old female with CKD presents with concern for hyperkalemia on outpatient labs.  Per her son and  patient the labs were done routinely.  She was called send her potassium was 6.9.  I am not able to see these labs in the computer.  Patient is essentially asymptomatic.  Vitals are reassuring she looks well.  Potassium here is 5.  Suspect is 6.9 with pseudohyperkalemia secondary to hemolysis.  Patient has been hypokalemic in the past with her CKD.  We discussed maintaining low potassium diet and she was given instructions about renal diet.  Recommend she have this rechecked in about 1 week.  She is otherwise appropriate for discharge given she has no other complaints today.         FINAL CLINICAL IMPRESSION(S) / ED DIAGNOSES   Final diagnoses:  Chronic kidney disease, unspecified CKD stage     Rx / DC Orders   ED Discharge Orders     None        Note:  This document was prepared using Dragon voice recognition software and may include unintentional dictation errors.   Rada Hay, MD 02/26/22 [phone]

## 2022-02-26 NOTE — ED Triage Notes (Signed)
Pt sts that her labs were collected and ran by [facility]. Per pt PCP pt potassium came back at 6.9. Pt sts that she is a kidney pt with stage 4 kidney failure. Pt sees Dr. Merita Norton with nephrology.

## 2022-02-28 NOTE — Progress Notes (Deleted)
Patient ID: Jocelyn Wells, female    DOB: 26-Sep-1955, 67 y.o.   MRN: [id number]  HPI  Jocelyn Wells is a 67 y/o female with a history of DM, HTN, CKD, stroke, anemia, anxiety, COPD, depression, leukocytosis, tobacco use and chronic heart failure.   Echo 02/24/22 showed EF of 60-65%. Echo report from 09/29/21 reviewed and showed an EF of 45-50% along with moderate LVH & mild LAE.   RHC/LHC done 03/25/15 and showed: Severe left ventricular systolic dysfunction with EF 25-30% Elevated pulmonary Order wedge pressure and upper normal to mildly elevated LVEDP  Normal coronary arteries.  Severe systolic left ventricular dysfunction with EF 25-30%. Mildly elevated LVEDP and moderate pulmonary capillary wedge elevation.  Was in the ED 02/26/22 due to K+ 6.9. ED potassium was 5.0. Admitted 01/01/22 due to fatigue and dyspnea, also increasing bilateral lower extremity swelling. On presentation she was requiring 2 L of oxygen to maintain oxygen saturation.  Chest x-ray showed cardiomegaly, mild vascular congestion, bibasilar edema. Needed blood transfusion due to anemia. Initially given IV lasix. Transitioned to oral diuretics. Wound consult done. Discharged after 3 days. Was in the ED 12/07/21 due to abnormal labs from PCP office but LWBS.   She presents today for a follow-up visit with a chief complaint of   Supposed to be wearing oxygen around the clock but she currently only wears it at bedtime as she's waiting on a portable oxygen tank.   Past Medical History:  Diagnosis Date   Anemia    Anxiety    Cardiomyopathy [facility])    new to her Jan 2017   CHF (congestive heart failure) (HCC)    Chronic kidney disease    COPD (chronic obstructive pulmonary disease) (HCC)    Depression    Diabetes mellitus, type II ([place])    HTN (hypertension)    Leukocytosis    Obesity    Osteoporosis    PONV (postoperative nausea and vomiting)    Stroke [facility])    Jan 2017   Past Surgical History:  Procedure Laterality  Date   ABDOMINAL HYSTERECTOMY     ANKLE FRACTURE SURGERY Left 02/09/2000   BILATERAL SALPINGOOPHORECTOMY  02/08/1998   CARDIAC CATHETERIZATION N/A 03/25/2015   Procedure: Right/Left Heart Cath and Coronary Angiography;  Surgeon: Belva Crome, MD;  Location: [place] CV LAB;  Service: Cardiovascular;  Laterality: N/A;   CESAREAN SECTION  02/09/1976   COLONOSCOPY WITH PROPOFOL N/A 09/22/2016   Procedure: COLONOSCOPY WITH PROPOFOL;  Surgeon: Manya Silvas, MD;  Location: [facility] ENDOSCOPY;  Service: Endoscopy;  Laterality: N/A;   COLONOSCOPY WITH PROPOFOL N/A 02/15/2022   Procedure: COLONOSCOPY WITH PROPOFOL;  Surgeon: Lin Landsman, MD;  Location: [facility] ENDOSCOPY;  Service: Gastroenterology;  Laterality: N/A;   EP IMPLANTABLE DEVICE N/A 08/05/2015   Procedure: Loop Recorder Insertion;  Surgeon: Deboraha Sprang, MD;  Location: [place] [facility];  Service: Cardiovascular;  Laterality: N/A;   ESOPHAGOGASTRODUODENOSCOPY N/A 02/15/2022   Procedure: ESOPHAGOGASTRODUODENOSCOPY (EGD);  Surgeon: Lin Landsman, MD;  Location: [facility] ENDOSCOPY;  Service: Gastroenterology;  Laterality: N/A;   ESOPHAGOGASTRODUODENOSCOPY (EGD) WITH PROPOFOL N/A 09/22/2016   Procedure: ESOPHAGOGASTRODUODENOSCOPY (EGD) WITH PROPOFOL;  Surgeon: Manya Silvas, MD;  Location: [facility] ENDOSCOPY;  Service: Endoscopy;  Laterality: N/A;   ESOPHAGOGASTRODUODENOSCOPY (EGD) WITH PROPOFOL N/A 12/08/2016   Procedure: ESOPHAGOGASTRODUODENOSCOPY (EGD) WITH PROPOFOL;  Surgeon: Manya Silvas, MD;  Location: [facility] ENDOSCOPY;  Service: Endoscopy;  Laterality: N/A;   FRACTURE SURGERY     KNEE ARTHROSCOPY Left  02/09/2003   LOWER EXTREMITY ANGIOGRAPHY Left 08/12/2021   Procedure: Lower Extremity Angiography;  Surgeon: Katha Cabal, MD;  Location: [place] [facility];  Service: Cardiovascular;  Laterality: Left;   TEE WITHOUT CARDIOVERSION N/A 01/31/2015   Procedure: TRANSESOPHAGEAL ECHOCARDIOGRAM (TEE);  Surgeon: Lelon Perla, MD;  Location: [facility] ENDOSCOPY;  Service: Cardiovascular;  Laterality: N/A;   TEMPORARY DIALYSIS CATHETER N/A 08/14/2021   Procedure: TEMPORARY DIALYSIS CATHETER;  Surgeon: Katha Cabal, MD;  Location: [place] [facility];  Service: Cardiovascular;  Laterality: N/A;   TUBAL LIGATION  02/09/1976   ULNAR NERVE TRANSPOSITION  02/08/2006   Family History  Problem Relation Age of Onset   Heart disease Mother        died from CHF   Asthma Mother    Diabetes Mother    Heart disease Father    Aneurysm Father    COPD Brother    Diabetes Brother    Alcohol abuse Paternal Aunt    Cancer Maternal Grandmother        unknownorigin   Anemia Neg Hx    Arrhythmia Neg Hx    Clotting disorder Neg Hx    Fainting Neg Hx    Heart attack Neg Hx    Heart failure Neg Hx    Hyperlipidemia Neg Hx    Hypertension Neg Hx    Social History   Tobacco Use   Smoking status: Some Days    Packs/day: 0.50    Years: 47.00    Total pack years: 23.50    Types: Cigarettes    Start date: 02/08/1974   Smokeless tobacco: Never   Tobacco comments:    Quit 01/18/2022  Substance Use Topics   Alcohol use: No    Alcohol/week: 0.0 standard drinks of alcohol   Allergies  Allergen Reactions   Codeine Nausea And Vomiting   Ivp Dye [Iodinated Contrast Media]     Patients states the IV Dye shuts her kidneys down    Review of Systems  Constitutional:  Positive for fatigue (easily). Negative for appetite change.  HENT:  Negative for congestion, postnasal drip and sore throat.   Eyes:  Positive for visual disturbance (blurry vision).  Respiratory:  Positive for shortness of breath and wheezing (at times). Negative for cough and chest tightness.   Cardiovascular:  Positive for leg swelling. Negative for chest pain and palpitations.  Gastrointestinal:  Negative for abdominal distention and abdominal pain.  Endocrine: Negative.   Genitourinary: Negative.   Musculoskeletal:  Positive for arthralgias  (knees) and back pain.  Skin:  Positive for wound (right leg).  Allergic/Immunologic: Negative.   Neurological:  Positive for dizziness and light-headedness.  Hematological:  Negative for adenopathy. Does not bruise/bleed easily.  Psychiatric/Behavioral:  Positive for dysphoric mood. Negative for sleep disturbance (sleeping on 1 pillow with oxygen at 2L). The patient is not nervous/anxious.      Physical Exam Vitals and nursing note reviewed. Exam conducted with a chaperone present (son).  Constitutional:      Appearance: She is ill-appearing.  HENT:     Head: Normocephalic and atraumatic.  Eyes:     Comments: Eyelids are puffy  Cardiovascular:     Rate and Rhythm: Normal rate and regular rhythm.  Pulmonary:     Effort: Pulmonary effort is normal. No respiratory distress.     Breath sounds: No wheezing or rales.  Abdominal:     General: There is no distension.     Palpations: Abdomen is soft.  Tenderness: There is no abdominal tenderness.  Musculoskeletal:        General: No tenderness.     Cervical back: Normal range of motion and neck supple.     Right lower leg: No tenderness. Edema (wrapped in [place] boot) present.     Left lower leg: No tenderness. Edema (wrapped in [place] boot) present.  Skin:    General: Skin is warm and dry.  Neurological:     General: No focal deficit present.     Mental Status: She is alert and oriented to person, place, and time.  Psychiatric:        Mood and Affect: Mood normal.        Behavior: Behavior normal.   Assessment & Plan:  1: Chronic heart failure with now preserved ejection fraction- - NYHA class III - euvolemic today - weighing daily; reminded to call for an overnight weight gain of > 2 pounds or a weekly weight gain of > 5 pounds - weight 223 pounds from last visit here 1 month ago - not adding salt to her food - recent echo shows improvement in her ejection fraction - on GDMT of carvedilol & entresto - due to renal function,  not a candidate for spironolactone or SGLT2 - saw cardiology Tera Mater) 02/11/22 - saw [facility] provider (Bensimhon) 01/26/22  - currently has lower legs wrapped in [place] boots and being changed by home health nurse - BNP 02/11/22 was 735.3 - participating in [facility] health program - has received her flu vaccine for this season  2: HTN with CKD- - BP  - saw PCP Rollene Rotunda) 01/18/22 - BMP 02/26/22 reviewed and showed sodium 135, potassium 5.0, creatinine 3.27 & GFR 15 - saw nephrology Candiss Norse) 02/10/22  3: COPD- - saw pulmonology Patsey Berthold) 01/29/22 - wears oxygen at 2L at bedtime - waiting on portable tank so that she can wear oxygen 24/7  4: Anemia- - had video visit with hematology Janese Banks) 12/29/21 - hemoglobin 02/26/22 was 12.2 - has colonoscopy/ EGD scheduled for 03/09/22 - received iron infusion 01/15/22  5: Uncontrolled DM- - saw endocrinology (Solum) 12/24/21 - A1c 12/16/21 was 10.1%   Patient did not bring her medications nor a list. Each medication was verbally reviewed with the patient and she was encouraged to bring the bottles to every visit to confirm accuracy of list.

## 2022-03-01 ENCOUNTER — Inpatient Hospital Stay: Payer: Medicare Other

## 2022-03-01 ENCOUNTER — Encounter: Payer: Medicare Other | Admitting: Family

## 2022-03-01 ENCOUNTER — Other Ambulatory Visit: Payer: Self-pay | Admitting: Psychiatry

## 2022-03-01 ENCOUNTER — Telehealth: Payer: Self-pay | Admitting: Family

## 2022-03-01 VITALS — BP 140/72 | HR 64 | Temp 95.1°F | Resp 18

## 2022-03-01 DIAGNOSIS — D509 Iron deficiency anemia, unspecified: Secondary | ICD-10-CM | POA: Diagnosis not present

## 2022-03-01 DIAGNOSIS — F3341 Major depressive disorder, recurrent, in partial remission: Secondary | ICD-10-CM

## 2022-03-01 DIAGNOSIS — D508 Other iron deficiency anemias: Secondary | ICD-10-CM

## 2022-03-01 MED ORDER — SODIUM CHLORIDE 0.9 % IV SOLN
200.0000 mg | INTRAVENOUS | Status: DC
  Administered 2022-03-01: 200 mg via INTRAVENOUS
  Filled 2022-03-01: qty 200

## 2022-03-01 MED ORDER — SODIUM CHLORIDE 0.9 % IV SOLN
INTRAVENOUS | Status: DC
  Filled 2022-03-01: qty 250

## 2022-03-01 NOTE — Telephone Encounter (Signed)
Patient did not show for her [facility] Failure [facility] appointment on 03/01/22. Will attempt to reschedule.

## 2022-03-02 DIAGNOSIS — E1122 Type 2 diabetes mellitus with diabetic chronic kidney disease: Secondary | ICD-10-CM | POA: Diagnosis not present

## 2022-03-02 DIAGNOSIS — E1159 Type 2 diabetes mellitus with other circulatory complications: Secondary | ICD-10-CM | POA: Diagnosis not present

## 2022-03-02 DIAGNOSIS — D631 Anemia in chronic kidney disease: Secondary | ICD-10-CM | POA: Diagnosis not present

## 2022-03-02 DIAGNOSIS — N184 Chronic kidney disease, stage 4 (severe): Secondary | ICD-10-CM | POA: Diagnosis not present

## 2022-03-02 DIAGNOSIS — I5032 Chronic diastolic (congestive) heart failure: Secondary | ICD-10-CM | POA: Diagnosis not present

## 2022-03-02 DIAGNOSIS — I152 Hypertension secondary to endocrine disorders: Secondary | ICD-10-CM | POA: Diagnosis not present

## 2022-03-03 ENCOUNTER — Telehealth: Payer: Self-pay

## 2022-03-03 NOTE — Telephone Encounter (Signed)
        Patient  visited [facility] on 02/26/2022  for Chronic kidney disease, unspecified CKD stage.   Telephone encounter attempt :  1st  A HIPAA compliant voice message was left requesting a return call.  Instructed patient to call back at [phone].   [place] Resource Care Guide   [email or web address]  ?? [phone]   Website: [email or web address]  [email or web address]

## 2022-03-04 ENCOUNTER — Telehealth: Payer: Self-pay

## 2022-03-04 ENCOUNTER — Telehealth (INDEPENDENT_AMBULATORY_CARE_PROVIDER_SITE_OTHER): Payer: Medicare Other | Admitting: Licensed Clinical Social Worker

## 2022-03-04 DIAGNOSIS — F4310 Post-traumatic stress disorder, unspecified: Secondary | ICD-10-CM | POA: Diagnosis not present

## 2022-03-04 DIAGNOSIS — F3341 Major depressive disorder, recurrent, in partial remission: Secondary | ICD-10-CM | POA: Diagnosis not present

## 2022-03-04 NOTE — Progress Notes (Signed)
THERAPIST PROGRESS NOTE  Session Time: 1:06pm-1:27pm   Virtual Visit via Video Note  I connected with [name] on 03/04/22 at 1:06pm EST by a video enabled telemedicine application and verified that I am speaking with the correct person using two identifiers.  Location: Patient: home  Provider: [place] office    I discussed the limitations of evaluation and management by telemedicine and the availability of in person appointments. The patient expressed understanding and agreed to proceed.    I discussed the assessment and treatment plan with the patient. The patient was provided an opportunity to ask questions and all were answered. The patient agreed with the plan and demonstrated an understanding of the instructions.   The patient was advised to call back or seek an in-person evaluation if the symptoms worsen or if the condition fails to improve as anticipated.  I provided 21 minutes of non-face-to-face time during this encounter.   Lorenda Hatchet   Participation Level: Active  Behavioral Response: Well GroomedAlertEuthymic  Type of Therapy: Individual Therapy  Treatment Goals addressed: Anxiety: Reduce overall frequency, intensity and duration of anxiety so that daily functioning is not impaired per pt self report 3 out of 5 sessions.    Depression: Reduce overall frequency, intensity and duration of depression so that daily functioning is not impaired per pt self report 3 out of 5 sessions documented.     Intervention: Learn and Implement coping skills that result in the reduction of anxiety and worry and improve daily functioning per pt self report 3 out of 5 documented sessions.    Intervention: Will work with pt to learn and implement calming skills to reduce overall depression and manage depression symptoms. Some of the techniques that will be used during session will be deep breathing exercises, visual imagery, progressive muscle relaxation, postreduction relative  to the benefits of self-care and other breathing exercises.   ProgressTowards Goals: Progressing  Interventions: CBT and Supportive  Summary: Jocelyn Wells is a 67 y.o. female who presents with continuing symptoms of anxiety and depression. Pt stated that she does have feelings of anxiety because she is not able to do the things she once did. Pt reported that she has mobility issues and that she is unable to drive and that her husband has to take her places and that sometimes he does not understand why she is unable to do some things.   Pt was able to explore in session ways that she could cope with her anxiety and depression and stated that her grandchildren bring her joy. Pt stated that she is able to play games and that she enjoys watching television.   Allowed pt to explore thoughts and feelings associated with life situations and external stressors. Encouraged expression of feelings and used empathic listening. Pt appeared to be oriented to time, place and situation. LCSW validated the pts feelings and thoughts and showed unconditional positive regard.   LCSW provided mood monitoring and treatment progress review in the context of this episode of treatment. LCSW reviewed the pt's mood status since last session.    Pt reported that her mood has been "pretty good" since the last visit and stated that she enjoys being with her family. Pt was able to explore in session how deep breathing could be helpful and was able to explore how square breathing could help with alleviating symptoms of anxiety.   Discussed with the pt the transition of a new therapist and provided the pt with the choice  of continuing services with the new therapist and answered any questions that the pt had about the transition.  Pt agreed at the last session that she would like to continue therapy services at this time and that she was ok with the new therapist.   Pt denies SI/HI or A/V hallucinations. Pt was cooperative  during visit and was engaged throughout the visit. Pt does not report any other concerns at the time of visit.   Encouraged pt to take medications as prescribed by their psychiatrist.    Suicidal/Homicidal: Nowithout intent/plan  Therapist Response: Educated on mindful deep breathing with the pt. Explored how the technique could help alleviate anxiety. Engaged with the pt to inhale for four seconds, then hold their air in their lungs for four seconds and then slowly exhale for six seconds. Encouraged the pt to practice the mindful deep breathing at home or any place where they feel that they need to take a few minutes to focus on breathing and being in the moment. Discussed with the pt how the technique could be effective in helping with anxiety and is a discreet way to help them stay grounded.   Educated on the importance of healthy coping skills with the pt. Explored the benefits of healthy coping skills and engaged the pt to discuss ways that they are coping with anxiety and depression. Encouraged the pt to explore new ways to help alleviate anxiety and determined new ways to help with distraction and coping in session. Discussed the benefits of exercise as a way of coping with anxiety and stress. Explored different ways that the pt could cope to include listening to music, being in nature, trying a new hobby and other activities that the pt could try to help with coping.    Continued Recommendations as followed: Self-care behaviors, positive social engagements, focusing on positive physical and emotional wellness, and focusing on life/work balance.    Plan: Return again on 03/18/2022.   Diagnosis:  Encounter Diagnoses  Name Primary?   MDD (major depressive disorder), recurrent, in partial remission ([place]) Yes   PTSD (post-traumatic stress disorder)      Collaboration of Care: Chart review in Epic   Patient/Guardian was advised Release of Information must be obtained prior to any record  release in order to collaborate their care with an outside provider. Patient/Guardian was advised if they have not already done so to contact the registration department to sign all necessary forms in order for [place] to release information regarding their care.   Consent: Patient/Guardian gives verbal consent for treatment and assignment of benefits for services provided during this visit. Patient/Guardian expressed understanding and agreed to proceed.   Lorenda Hatchet 03/04/2022

## 2022-03-04 NOTE — Telephone Encounter (Signed)
        Patient  visited [facility] on 02/26/2022  for Chronic kidney disease, unspecified CKD stage.   Telephone encounter attempt :  2nd  A HIPAA compliant voice message was left requesting a return call.  Instructed patient to call back at [phone].   [place] Resource Care Guide   [email or web address]  ?? [phone]   Website: [email or web address]  [email or web address]

## 2022-03-05 DIAGNOSIS — N184 Chronic kidney disease, stage 4 (severe): Secondary | ICD-10-CM | POA: Diagnosis not present

## 2022-03-05 DIAGNOSIS — I152 Hypertension secondary to endocrine disorders: Secondary | ICD-10-CM | POA: Diagnosis not present

## 2022-03-05 DIAGNOSIS — E1122 Type 2 diabetes mellitus with diabetic chronic kidney disease: Secondary | ICD-10-CM | POA: Diagnosis not present

## 2022-03-05 DIAGNOSIS — E1159 Type 2 diabetes mellitus with other circulatory complications: Secondary | ICD-10-CM | POA: Diagnosis not present

## 2022-03-05 DIAGNOSIS — I5032 Chronic diastolic (congestive) heart failure: Secondary | ICD-10-CM | POA: Diagnosis not present

## 2022-03-05 DIAGNOSIS — D631 Anemia in chronic kidney disease: Secondary | ICD-10-CM | POA: Diagnosis not present

## 2022-03-08 ENCOUNTER — Telehealth: Payer: Self-pay

## 2022-03-08 ENCOUNTER — Inpatient Hospital Stay: Payer: Medicare Other

## 2022-03-08 VITALS — BP 152/82 | HR 71 | Temp 97.8°F | Resp 17

## 2022-03-08 DIAGNOSIS — D508 Other iron deficiency anemias: Secondary | ICD-10-CM

## 2022-03-08 DIAGNOSIS — D509 Iron deficiency anemia, unspecified: Secondary | ICD-10-CM | POA: Diagnosis not present

## 2022-03-08 MED ORDER — SODIUM CHLORIDE 0.9 % IV SOLN
200.0000 mg | INTRAVENOUS | Status: DC
  Administered 2022-03-08: 200 mg via INTRAVENOUS
  Filled 2022-03-08: qty 200

## 2022-03-08 NOTE — Patient Instructions (Signed)
Iron Sucrose Injection What is this medication? IRON SUCROSE (EYE ern SOO krose) treats low levels of iron (iron deficiency anemia) in people with kidney disease. Iron is a mineral that plays an important role in making red blood cells, which carry oxygen from your lungs to the rest of your body. This medicine may be used for other purposes; ask your health care provider or pharmacist if you have questions. COMMON BRAND NAME(S): Venofer What should I tell my care team before I take this medication? They need to know if you have any of these conditions: Anemia not caused by low iron levels Heart disease High levels of iron in the blood Kidney disease Liver disease An unusual or allergic reaction to iron, other medications, foods, dyes, or preservatives Pregnant or trying to get pregnant Breastfeeding How should I use this medication? This medication is for infusion into a vein. It is given in a hospital or clinic setting. Talk to your care team about the use of this medication in children. While this medication may be prescribed for children as young as 2 years for selected conditions, precautions do apply. Overdosage: If you think you have taken too much of this medicine contact a poison control center or emergency room at once. NOTE: This medicine is only for you. Do not share this medicine with others. What if I miss a dose? Keep appointments for follow-up doses. It is important not to miss your dose. Call your care team if you are unable to keep an appointment. What may interact with this medication? Do not take this medication with any of the following: Deferoxamine Dimercaprol Other iron products This medication may also interact with the following: Chloramphenicol Deferasirox This list may not describe all possible interactions. Give your health care provider a list of all the medicines, herbs, non-prescription drugs, or dietary supplements you use. Also tell them if you smoke,  drink alcohol, or use illegal drugs. Some items may interact with your medicine. What should I watch for while using this medication? Visit your care team regularly. Tell your care team if your symptoms do not start to get better or if they get worse. You may need blood work done while you are taking this medication. You may need to follow a special diet. Talk to your care team. Foods that contain iron include: whole grains/cereals, dried fruits, beans, or peas, leafy green vegetables, and organ meats (liver, kidney). What side effects may I notice from receiving this medication? Side effects that you should report to your care team as soon as possible: Allergic reactions--skin rash, itching, hives, swelling of the face, lips, tongue, or throat Low blood pressure--dizziness, feeling faint or lightheaded, blurry vision Shortness of breath Side effects that usually do not require medical attention (report to your care team if they continue or are bothersome): Flushing Headache Joint pain Muscle pain Nausea Pain, redness, or irritation at injection site This list may not describe all possible side effects. Call your doctor for medical advice about side effects. You may report side effects to FDA at 1-800-FDA-1088. Where should I keep my medication? This medication is given in a hospital or clinic and will not be stored at home. NOTE: This sheet is a summary. It may not cover all possible information. If you have questions about this medicine, talk to your doctor, pharmacist, or health care provider.  2023 Elsevier/Gold Standard (2020-05-08 00:00:00)

## 2022-03-08 NOTE — Telephone Encounter (Signed)
Patient left a voicemail stating she did not know what the capsule study was or the instructions. Returned patient call and explained to patient we talk on 03/22/22 and went over instructions. I also mailed them and sent to [facility]. She states she guessed she forgot about it. Reschedule to 04/06/2022 and mailed out new instructions

## 2022-03-08 NOTE — Telephone Encounter (Signed)
        Patient  visited [facility] on 02/26/2022  for Chronic kidney disease, unspecified CKD stage.   Telephone encounter attempt :  3rd  A HIPAA compliant voice message was left requesting a return call.  Instructed patient to call back at [phone].   [place] Resource Care Guide   [email or web address]  ?? [phone]   Website: [email or web address]  [email or web address]

## 2022-03-09 DIAGNOSIS — I152 Hypertension secondary to endocrine disorders: Secondary | ICD-10-CM | POA: Diagnosis not present

## 2022-03-09 DIAGNOSIS — E1159 Type 2 diabetes mellitus with other circulatory complications: Secondary | ICD-10-CM | POA: Diagnosis not present

## 2022-03-09 DIAGNOSIS — N184 Chronic kidney disease, stage 4 (severe): Secondary | ICD-10-CM | POA: Diagnosis not present

## 2022-03-09 DIAGNOSIS — I5032 Chronic diastolic (congestive) heart failure: Secondary | ICD-10-CM | POA: Diagnosis not present

## 2022-03-09 DIAGNOSIS — D631 Anemia in chronic kidney disease: Secondary | ICD-10-CM | POA: Diagnosis not present

## 2022-03-09 DIAGNOSIS — E1122 Type 2 diabetes mellitus with diabetic chronic kidney disease: Secondary | ICD-10-CM | POA: Diagnosis not present

## 2022-03-12 DIAGNOSIS — E1122 Type 2 diabetes mellitus with diabetic chronic kidney disease: Secondary | ICD-10-CM | POA: Diagnosis not present

## 2022-03-12 DIAGNOSIS — I152 Hypertension secondary to endocrine disorders: Secondary | ICD-10-CM | POA: Diagnosis not present

## 2022-03-12 DIAGNOSIS — E1159 Type 2 diabetes mellitus with other circulatory complications: Secondary | ICD-10-CM | POA: Diagnosis not present

## 2022-03-12 DIAGNOSIS — N184 Chronic kidney disease, stage 4 (severe): Secondary | ICD-10-CM | POA: Diagnosis not present

## 2022-03-12 DIAGNOSIS — D631 Anemia in chronic kidney disease: Secondary | ICD-10-CM | POA: Diagnosis not present

## 2022-03-12 DIAGNOSIS — I5032 Chronic diastolic (congestive) heart failure: Secondary | ICD-10-CM | POA: Diagnosis not present

## 2022-03-15 ENCOUNTER — Inpatient Hospital Stay: Payer: Medicare Other | Attending: Oncology

## 2022-03-15 VITALS — BP 155/70 | HR 73 | Resp 18

## 2022-03-15 DIAGNOSIS — D509 Iron deficiency anemia, unspecified: Secondary | ICD-10-CM | POA: Insufficient documentation

## 2022-03-15 DIAGNOSIS — D508 Other iron deficiency anemias: Secondary | ICD-10-CM

## 2022-03-15 MED ORDER — SODIUM CHLORIDE 0.9 % IV SOLN
200.0000 mg | INTRAVENOUS | Status: DC
  Administered 2022-03-15: 200 mg via INTRAVENOUS
  Filled 2022-03-15: qty 200

## 2022-03-15 MED ORDER — SODIUM CHLORIDE 0.9 % IV SOLN
INTRAVENOUS | Status: DC
  Filled 2022-03-15 (×2): qty 250

## 2022-03-15 NOTE — Patient Instructions (Signed)

## 2022-03-16 ENCOUNTER — Ambulatory Visit: Payer: Medicare Other

## 2022-03-16 ENCOUNTER — Telehealth: Payer: Self-pay

## 2022-03-16 ENCOUNTER — Telehealth (HOSPITAL_COMMUNITY): Payer: Self-pay

## 2022-03-16 DIAGNOSIS — I5032 Chronic diastolic (congestive) heart failure: Secondary | ICD-10-CM | POA: Diagnosis not present

## 2022-03-16 DIAGNOSIS — E1122 Type 2 diabetes mellitus with diabetic chronic kidney disease: Secondary | ICD-10-CM | POA: Diagnosis not present

## 2022-03-16 DIAGNOSIS — N184 Chronic kidney disease, stage 4 (severe): Secondary | ICD-10-CM | POA: Diagnosis not present

## 2022-03-16 DIAGNOSIS — E1159 Type 2 diabetes mellitus with other circulatory complications: Secondary | ICD-10-CM | POA: Diagnosis not present

## 2022-03-16 DIAGNOSIS — I152 Hypertension secondary to endocrine disorders: Secondary | ICD-10-CM | POA: Diagnosis not present

## 2022-03-16 DIAGNOSIS — I10 Essential (primary) hypertension: Secondary | ICD-10-CM

## 2022-03-16 DIAGNOSIS — I5022 Chronic systolic (congestive) heart failure: Secondary | ICD-10-CM

## 2022-03-16 DIAGNOSIS — D631 Anemia in chronic kidney disease: Secondary | ICD-10-CM | POA: Diagnosis not present

## 2022-03-16 NOTE — Progress Notes (Unsigned)
Care Management & Coordination Services Pharmacy Note  03/16/2022 Name:  Jocelyn Wells MRN:  [id number] DOB:  04/16/1955  Summary: Patient presents for follow-up consult.   -Patient was started on Entresto, but has been out for a week due to $156 copay. Blood pressure has been elevated without the medication.   Recommendations/Changes made from today's visit: -Will reach out to Cardiology to see if samples can be provided. Will also start PAP for Entresto.   Follow up plan: CPP follow-up 1 month  Subjective: Jocelyn Wells is an 67 y.o. year old female who is a primary patient of Gwyneth Sprout, FNP.  The care coordination team was consulted for assistance with disease management and care coordination needs.    Engaged with patient by telephone for follow up visit.  Recent office visits: 01/18/22: Patient presented to Tally Joe, Fnp for follow-up.  09/02/2021 Tally Joe FNP (PCP) No Medication Changes noted, Return in about 3 months   Recent consult visits: 03/04/22: Patient presented to Verl Blalock, LCSW (Behavioral health)  02/11/22: Patient presented to Gerrie Nordmann, NP (Cardiology)  02/10/22: Patient presented to Dr. Candiss Norse (Nephrology) for follow-up.  02/03/22: Patient presented to Darylene Price, [place] (Cardiology)  01/29/22: Patient presented to Dr. Patsey Berthold (Pulmonology)  01/28/22: Patient presented to Dr. Ronalee Belts (Vascular)  Hospital visits: 01/01/22: Patient hospitalized for anemia    Objective:  Lab Results  Component Value Date   CREATININE 3.27 (H) 02/26/2022   BUN 52 (H) 02/26/2022   EGFR 20 (L) 12/04/2021   GFRNONAA 15 (L) 02/26/2022   GFRAA 33 (L) 04/23/2019   NA 135 02/26/2022   K 5.0 02/26/2022   CALCIUM 9.0 02/26/2022   CO2 26 02/26/2022   GLUCOSE 299 (H) 02/26/2022    Lab Results  Component Value Date/Time   HGBA1C 10.2 (H) 11/23/2021 03:53 PM   HGBA1C 7.9 (A) 07/14/2021 03:44 PM   HGBA1C 10.3 09/02/2020 12:00 AM   HGBA1C 10.2 (H) 05/31/2020  05:32 AM   HGBA1C 11.4 03/28/2019 12:00 AM   MICROALBUR 34 05/19/2016 12:00 AM    Last diabetic Eye exam:  Lab Results  Component Value Date/Time   HMDIABEYEEXA Retinopathy (A) 07/15/2020 02:18 PM    Last diabetic Foot exam: No results found for: "HMDIABFOOTEX"   Lab Results  Component Value Date   CHOL 107 12/04/2021   HDL 36 (L) 12/04/2021   LDLCALC 50 12/04/2021   TRIG 112 12/04/2021   CHOLHDL 3.0 12/04/2021       Latest Ref Rng & Units 12/15/2021    3:28 PM 12/07/2021    5:44 PM 12/04/2021    3:42 PM  Hepatic Function  Total Protein 6.5 - 8.1 g/dL 6.4  6.8  6.0   Albumin 3.5 - 5.0 g/dL 3.1  3.2  3.6   AST 15 - 41 U/L '13  12  13   '$ ALT 0 - 44 U/L '10  10  11   '$ Alk Phosphatase 38 - 126 U/L 81  92  106   Total Bilirubin 0.3 - 1.2 mg/dL 0.4  0.5  <0.2     Lab Results  Component Value Date/Time   TSH 2.977 10/06/2021 12:38 PM       Latest Ref Rng & Units 02/26/2022    4:25 PM 02/24/2022   10:09 AM 01/26/2022   11:06 AM  CBC  WBC 4.0 - 10.5 K/uL 12.1  10.8  9.2   Hemoglobin 12.0 - 15.0 g/dL 12.2  11.9  9.2   Hematocrit 36.0 -  46.0 % 41.9  41.6  32.7   Platelets 150 - 400 K/uL 613  589  485     Lab Results  Component Value Date/Time   VITAMINB12 310 12/15/2021 03:28 PM    Clinical ASCVD: Yes  The ASCVD Risk score (Arnett DK, et al., 2019) failed to calculate for the following reasons:   The patient has a prior MI or stroke diagnosis       02/17/2022    2:53 PM 01/18/2022    3:11 PM 12/11/2021    5:19 PM  Depression screen PHQ 2/9  Decreased Interest 1 0 1  Down, Depressed, Hopeless 0 0 1  PHQ - 2 Score 1 0 2  Altered sleeping 1 3   Tired, decreased energy 1 3   Change in appetite 1 0   Feeling bad or failure about yourself  0 0   Trouble concentrating 0 0   Moving slowly or fidgety/restless 0 0   Suicidal thoughts 0 0   PHQ-9 Score 4 6   Difficult doing work/chores Somewhat difficult Not difficult at all      Social History   Tobacco Use   Smoking Status Some Days   Packs/day: 0.50   Years: 47.00   Total pack years: 23.50   Types: Cigarettes   Start date: 02/08/1974  Smokeless Tobacco Never  Tobacco Comments   Quit 01/18/2022   BP Readings from Last 3 Encounters:  03/15/22 (!) 155/70  03/08/22 (!) 152/82  03/01/22 (!) 140/72   Pulse Readings from Last 3 Encounters:  03/15/22 73  03/08/22 71  03/01/22 64   Wt Readings from Last 3 Encounters:  02/26/22 215 lb (97.5 kg)  02/15/22 218 lb (98.9 kg)  02/11/22 218 lb (98.9 kg)   BMI Readings from Last 3 Encounters:  02/26/22 34.70 kg/m  02/15/22 35.19 kg/m  02/11/22 35.19 kg/m    Allergies  Allergen Reactions   Codeine Nausea And Vomiting   Ivp Dye [Iodinated Contrast Media]     Patients states the IV Dye shuts her kidneys down    Medications Reviewed Today     Reviewed by Regan Rakers, RN (Registered Nurse) on 03/15/22 at 64  Med List Status: <None>   Medication Order Taking? Sig Documenting Provider Last Dose Status Informant  albuterol (VENTOLIN HFA) 108 (90 Base) MCG/ACT inhaler [id number] No TAKE 2 PUFFS BY MOUTH EVERY 6 HOURS AS NEEDED FOR WHEEZE OR SHORTNESS OF BREATH  Patient taking differently: Inhale 2 puffs into the lungs every 6 (six) hours as needed for wheezing or shortness of breath.   Gwyneth Sprout, FNP Taking Active Spouse/Significant Other  apixaban (ELIQUIS) 5 MG TABS tablet [id number] No Take 1 tablet (5 mg total) by mouth 2 (two) times daily. Gerrie Nordmann, NP 02/07/2022 Active   atorvastatin (LIPITOR) 40 MG tablet [id number] No Take 1 tablet (40 mg total) by mouth daily. Gwyneth Sprout, FNP 02/14/2022 Active Spouse/Significant Other  buPROPion (WELLBUTRIN SR) 150 MG 12 hr tablet [id number] No Take 1 tablet (150 mg total) by mouth 2 (two) times daily. Gwyneth Sprout, FNP 02/14/2022 Active   carvedilol (COREG) 25 MG tablet [id number] No Take 1 tablet (25 mg total) by mouth 2 (two) times daily with a meal. Shelly Coss, MD 02/14/2022  Active   clopidogrel (PLAVIX) 75 MG tablet [id number] No Take 1 tablet (75 mg total) by mouth daily. Gerrie Nordmann, NP 02/07/2022 Active Spouse/Significant Other  Dulaglutide 3 MG/0.5ML SOPN [id number] No Inject into the skin once  a week. [provider] 02/01/2022 Active Spouse/Significant Other           Med Note [facility], RAQUEL   Wed Dec 16, 2021  2:47 PM) On Mondays  fluticasone (FLONASE) 50 MCG/ACT nasal spray [id number] No Place 2 sprays into both nostrils daily. Gwyneth Sprout, FNP Taking Active Spouse/Significant Other  Fluticasone-Umeclidin-Vilant (TRELEGY ELLIPTA) 100-62.5-25 MCG/ACT AEPB [id number] No Inhale 1 puff into the lungs daily. Tyler Pita, MD Taking Active   Fluticasone-Umeclidin-Vilant [facility] ELLIPTA) 100-62.5-25 MCG/ACT AEPB [id number] No Inhale 1 puff into the lungs daily.  Patient not taking: Reported on 02/03/2022   Tyler Pita, MD Not Taking Active   gabapentin (NEURONTIN) 300 MG capsule [id number] No Take 300 mg by mouth 2 (two) times daily. [provider] 02/14/2022 Active Spouse/Significant Other  glucose blood test strip [id number] No OneTouch Verio strips [provider] Taking Active Spouse/Significant Other  hydrALAZINE (APRESOLINE) 25 MG tablet [id number]  Take 1 tablet (25 mg total) by mouth 3 (three) times daily. Darylene Price A, FNP  Active   insulin aspart (NOVOLOG) 100 UNIT/ML injection [id number] No Insulin pump [provider] Taking Active Spouse/Significant Other  Insulin Disposable Pump (OMNIPOD DASH PODS, GEN 4,) MISC [id number] No USE 1 POD EACH EVERY 72    HOURS [provider] 02/14/2022 Active Spouse/Significant Other  Insulin Human (INSULIN PUMP) SOLN [id number] No Per endocrinoloy Elgergawy, Silver Huguenin, MD Taking Active Spouse/Significant Other  loratadine (CLARITIN) 10 MG tablet [id number] No Take 1 tablet by mouth daily. [provider] 02/14/2022 Active Spouse/Significant Other            Med Note [facility], RACHEL A   Fri Mar 21, 2015 10:46 AM)    meclizine (ANTIVERT) 12.5 MG tablet [id number] No Take 1 tablet (12.5 mg total) by mouth 3 (three) times daily as needed for dizziness. Myles Gip, DO Taking Active Spouse/Significant Other  OXYGEN [id number] No Place 2 L into the nose continuous. [provider] Taking Active Self  pantoprazole (PROTONIX) 40 MG tablet [id number] No Take 1 tablet (40 mg total) by mouth 2 (two) times daily. Sharen Hones, MD 02/14/2022 Active Spouse/Significant Other  PARoxetine (PAXIL) 40 MG tablet [id number] No Take 1 tablet (40 mg total) by mouth daily. Norman Clay, MD 02/14/2022 Active Spouse/Significant Other  sacubitril-valsartan (ENTRESTO) 49-51 MG [id number] No Take 1 tablet by mouth 2 (two) times daily. Bensimhon, Shaune Pascal, MD 02/14/2022 Active   Spacer/Aero-Holding Chambers ([place]) MISC [id number] No Use with inhaler to ensure medication is delivered throughout lungs Gwyneth Sprout, FNP Taking Active Spouse/Significant Other  torsemide (DEMADEX) 20 MG tablet [id number]  Take 1 tablet (20 mg total) by mouth every Monday, Wednesday, and Friday. Gerrie Nordmann, NP  Active   Vitamin D, Ergocalciferol, (DRISDOL) 1.25 MG (50000 UNIT) CAPS capsule [id number] No Take 50,000 Units by mouth every 7 (seven) days. [provider] Taking Active   Med List Note Janett Billow, RN 11/03/21 1349): MR 03/03/22 UDS 11/03/21            SDOH:  (Social Determinants of Health) assessments and interventions performed: Yes SDOH Interventions    Flowsheet Row Counselor from 02/17/2022 in confidential department Office Visit from 01/18/2022 in [place] Telephone from 01/05/2022 in [place] Coordination Office Visit from 12/15/2021 in [place] [facility] [place] Management from 12/11/2021 in [place]  Management from 09/08/2021 in [facility]  Family Practice  SDOH Interventions        Food Insecurity Interventions -- -- Intervention Not Indicated -- Intervention Not Indicated --  Housing Interventions -- -- Intervention Not Indicated -- Intervention Not Indicated --  Transportation Interventions -- -- Intervention Not Indicated Intervention Not Indicated Intervention Not Indicated Intervention Not Indicated  Utilities Interventions -- -- -- -- Intervention Not Indicated --  Depression Interventions/Treatment  Counseling Medication -- -- PHQ2-9 Score <4 Follow-up Not Indicated --  Financial Strain Interventions -- -- -- -- Intervention Not Indicated Intervention Not Indicated  Physical Activity Interventions -- -- -- -- Other (Comments)  [Currently receiving in home therapy twice a week.] --  Stress Interventions -- -- -- -- Other (Comment)  [Reports currently managing well/Declined current need for counseing] --  Social Connections Interventions -- -- -- -- Intervention Not Indicated --       Medication Assistance: Application for Entresto  medication assistance program. in process.  Anticipated assistance start date TBD.  See plan of care for additional detail.  Medication Access: Within the past 30 days, how often has patient missed a dose of medication? None Is a pillbox or other method used to improve adherence? Yes  Factors that may affect medication adherence? no barriers identified Are meds synced by current pharmacy? No  Are meds delivered by current pharmacy? No  Does patient experience delays in picking up medications due to transportation concerns? No   [facility] Services Reviewed: Is patient disadvantaged to use [facility]?: Yes  Current Rx insurance plan: [place] Name and location of Current pharmacy[facility]  [id number] [place] [place] [address]Phone: [phone]: [phone] [facility] [address] [address]: [phone]: [phone] [facility] services reviewed with patient today?: No  Patient requests to transfer care to [facility]?: No  Reason patient declined to change pharmacies: Disadvantaged due to insurance/mail order  Compliance/Adherence/Medication fill history: Care Gaps: Tdap Opthalmology exam  Mammogram  Lung Cancer Screen DEXA   Star-Rating Drugs: Atorvastatin 40 mg last filled [facility]09[facility] Trulicity 3 mg [facility]18[facility]2021 [facility]  Assessment/Plan  Heart Failure (Goal: manage symptoms and prevent exacerbations) -Controlled -Last ejection fraction: 60-65%, normal LV function (Date: Jan 2024) -HF type: HF w/recovered EF  -NYHA Class: II (slight limitation of activity) -Current treatment: Carvedilol 25 mg twice daily  Hydralazine 25 mg three times daily  Torsemide 20 mg daily -Medications previously tried: HCTZ, Losartan (AKI), Metoprolol, Spironolactone  -Patient was started on Entresto, but has been out for a week due to $156 copay. Blood pressure has been elevated without the medication.  -Will reach out to Cardiology to see if samples can be provided. Will also start PAP for Entresto.  -Recommended to continue current medication  Hyperlipidemia: (LDL goal < 55) -Controlled -Stenting July 5th  -Current treatment: Atorvastatin 40 mg daily  -Current treatment: Clopidogrel 75 mg daily  -Medications previously tried: NA  -Recommended to continue current medication  Diabetes (A1c goal <8%) -Uncontrolled -Managed by Dr. SGabriel Carina -Current medications: Trulicity 3 mg weekly on Mondays  Novolog via insulin pump  -Medications previously tried: Ozempic (nausea), glyburide/metformin (CKD),   -Current home glucose readings (uses freestyle [place]   -Denies hypoglycemic/hyperglycemic symptoms -Recommended to continue current medication  COPD (Goal:  control symptoms and prevent exacerbations) -Controlled -Current treatment  Albuterol 2 puffs every 6 hours as needed  Flonase  Loratadine 10 mg daily  -Medications previously tried: NA  -Frequency of rescue inhaler use: <1x in past month.  -Recommended to continue current medication  Depression/Anxiety (Goal: Maintain symptom remission) -Controlled -Current treatment: Paroxetine 40 mg daily  -Medications previously tried/failed: Aripiprazole  -Recommended to continue current medication  Chronic DVTs (Goal: Prevent clots) -Controlled -Current treatment  Eliquis 5 mg twice daily  -Medications previously tried: NA  -Recommended to continue current medication  Chronic Kidney Disease Stage 4  -All medications assessed for renal dosing and appropriateness in chronic kidney disease. -told by Dr. Lanora Manis 4 glasses of water daily.  -Drinks unsweetened tea 4-5 glasses. 2-3 of water.   -Recommended to continue current medication  Junius Argyle, PharmD, [place], [place] Pharmacist Practitioner  [facility] [phone]

## 2022-03-16 NOTE — Telephone Encounter (Signed)
Her primary care office called to let us know that she cannot afford her Entresto. They asked if we had samples we could give her and that they will get her patient assistance set up. Can you help since at [place]?

## 2022-03-16 NOTE — Progress Notes (Signed)
Per Clinical pharmacist, please call her HF clinic at [place]. She has not been able to afford her Entresto. Can you see if they have any samples for her while we get her set up with patient assistance? I am sending you the task for Entesto PAP now as well.   03/16/2022: I reach out to [place], and left a message regarding clinical pharmacist request with my contact information.  03/17/2022: Per patient chart note on 03/17/2022 they do have samples for her,but she needs to return their call to plan a time to pick it up.  I received a task from Junius Argyle, CPP requesting that I start the application for patient assistance on the medication Entresto prescribe by Dr. Haroldine Laws Cardiologist.    Application will be mailed.Once she receives the application she will need to complete her part of the application and return it to her cardiology office to fax over to the [employer] for processing.Patient to include a copy of her proof of income AND a copy of her Explanation of Benefits (EOB) statement from her insurance. The application will need to be faxed to [phone] and the phone number that can be called to check the status of the application will be [phone].    Application emailed to Junius Argyle, CPP for review and to mail to patient's home.  03/18/2022: Per patient chart note on 03/17/2022, patient return cardiologist call and inform them she will pick up sample today.  [place] Pharmacist Assistant [phone]

## 2022-03-17 ENCOUNTER — Telehealth: Payer: Self-pay

## 2022-03-17 ENCOUNTER — Telehealth: Payer: Self-pay | Admitting: Family

## 2022-03-17 NOTE — Telephone Encounter (Signed)
Spoke to son about someone coming by to pick up entresto samples for her as patient states she is out of the medication. Son was also not sure if patient assistance has been done so I asked him to bring proof of income for her and to sign a [employer] application just in case.   Stellarose Cerny, NT

## 2022-03-17 NOTE — Telephone Encounter (Addendum)
Spoke with pt via phone regarding Entresto 49-'51mg'$ .  Pt aware, agreeable, and verbalized understanding of med importance. Pt will pick up Samples provided for her tomorrow morning to our [facility] HF clinic.  Medication Samples have been provided to the patient.  Drug name: Delene Loll       Strength: 49-51        Qty: 2  LOT: [id number]  Exp.Date: March 2025  Dosing instructions: one tablet by mouth two times daily.  The patient has been instructed regarding the correct time, dose, and frequency of taking this medication, including desired effects and most common side effects.   Julianne Handler 4:03 PM 03/17/2022

## 2022-03-17 NOTE — Telephone Encounter (Signed)
LVM with patient letting her know we have Entresto samples for patient and for her to call and plan a time to come by.   Cael Worth, NT

## 2022-03-18 ENCOUNTER — Encounter: Payer: Self-pay | Admitting: Oncology

## 2022-03-18 ENCOUNTER — Other Ambulatory Visit (HOSPITAL_COMMUNITY): Payer: Self-pay

## 2022-03-18 ENCOUNTER — Telehealth (INDEPENDENT_AMBULATORY_CARE_PROVIDER_SITE_OTHER): Payer: Medicare Other | Admitting: Licensed Clinical Social Worker

## 2022-03-18 DIAGNOSIS — F3341 Major depressive disorder, recurrent, in partial remission: Secondary | ICD-10-CM

## 2022-03-18 DIAGNOSIS — F4310 Post-traumatic stress disorder, unspecified: Secondary | ICD-10-CM | POA: Diagnosis not present

## 2022-03-18 NOTE — Progress Notes (Signed)
THERAPIST PROGRESS NOTE  Session Time: 1:06pm-1:32pm   Virtual Visit via Video Note  I connected with [name] on 03/18/22 at 1:06pm EST by a video enabled telemedicine application and verified that I am speaking with the correct person using two identifiers.  Location: Patient: home  Provider: [place] office    I discussed the limitations of evaluation and management by telemedicine and the availability of in person appointments. The patient expressed understanding and agreed to proceed.    I discussed the assessment and treatment plan with the patient. The patient was provided an opportunity to ask questions and all were answered. The patient agreed with the plan and demonstrated an understanding of the instructions.   The patient was advised to call back or seek an in-person evaluation if the symptoms worsen or if the condition fails to improve as anticipated.  I provided 26 minutes of non-face-to-face time during this encounter.   Lorenda Hatchet   Participation Level: Active  Behavioral Response: Well GroomedAlertEuthymic  Type of Therapy: Individual Therapy  Treatment Goals addressed: Anxiety: Reduce overall frequency, intensity and duration of anxiety so that daily functioning is not impaired per pt self report 3 out of 5 sessions.    Depression: Reduce overall frequency, intensity and duration of depression so that daily functioning is not impaired per pt self report 3 out of 5 sessions documented.     Intervention: Learn and Implement coping skills that result in the reduction of anxiety and worry and improve daily functioning per pt self report 3 out of 5 documented sessions.    Intervention: Will work with pt to learn and implement calming skills to reduce overall depression and manage depression symptoms. Some of the techniques that will be used during session will be deep breathing exercises, visual imagery, progressive muscle relaxation, postreduction relative  to the benefits of self-care and other breathing exercises.   ProgressTowards Goals: Progressing  Interventions: CBT and Supportive  Summary: JARELIS EHLERT is a 67 y.o. female who presents with continuing symptoms of anxiety and depression. Pt reported that she has mobility issues and that she has to use a walker to help her walk. Pt stated that she has been coping by watching television.   Pt was able to explore in session ways that she could cope with her anxiety and depression and stated that her grandchildren bring her joy. Pt stated that she is able to play games and that she enjoys watching television. Pt stated that her mood has been "good". Pt stated that she has not had any days where she is sad or anxious.   Allowed pt to explore thoughts and feelings associated with life situations and external stressors. Encouraged expression of feelings and used empathic listening. Pt appeared to be oriented to time, place and situation. LCSW validated the pts feelings and thoughts and showed unconditional positive regard.   LCSW provided mood monitoring and treatment progress review in the context of this episode of treatment. LCSW reviewed the pt's mood status since last session.     Pt was able to explore her social support and stated that her family is her social support and that she enjoys being around her son and her grandchildren. Pt stated that she has one close friend that is supportive. Pt stated that she will continue to utilize self-care and that she has been feeling "good".   Pt denies SI/HI or A/V hallucinations. Pt was cooperative during visit and was engaged throughout the visit. Pt  does not report any other concerns at the time of visit.   Encouraged pt to take medications as prescribed by their psychiatrist.    Suicidal/Homicidal: Nowithout intent/plan  Therapist Response: Explored with pt how one important way to regain emotional health is to develop and utilize healthy social  supports. Engaged the pt to explore who their support system is to include any family members, friends, other resources or social groups that they are part of feel and important to. Explored that the support must be someone or a group that they trust and affirm their individuality. Encouraged the pt to explore any barriers that stand in the way in developing their support system to include: difficult time reaching out to others, low self-esteem, hard time making and keeping friends and any other barriers that prevent them from having social supports. Explored what the pt feels that they need and want from their support system and empowered the pt to commit themselves to develop a support sytem and to not give up on developing and utilizing their healthy support system. Explored with the pt how it makes them feel to be supportive and looked at how they can look for new supports and resources to help them with providing positive support.    Encouraged the pt to participate in activities in which they will experience success and achievement. Explored with the pt distraction as a coping mechanism and explored cognitive distractions, behavioral distraction and physiological distractions. Provided pt with examples of each form of distractions and explored with pt new ways that they could implement the distractions into their daily routine when they face difficult emotions and stressors.  Continued Recommendations as followed: Self-care behaviors, positive social engagements, focusing on positive physical and emotional wellness, and focusing on life/work balance.    Plan: Discussed with the pt the transition of a new therapist and provided the pt with the choice of continuing services with the new therapist and answered any questions that the pt had about the transition.  Pt agreed to the transition to the new therapist Katie Bounds, LCSW.   Diagnosis:  Encounter Diagnoses  Name Primary?   PTSD (post-traumatic  stress disorder) Yes   MDD (major depressive disorder), recurrent, in partial remission ([place])      Collaboration of Care: Chart review in Epic   Patient/Guardian was advised Release of Information must be obtained prior to any record release in order to collaborate their care with an outside provider. Patient/Guardian was advised if they have not already done so to contact the registration department to sign all necessary forms in order for [place] to release information regarding their care.   Consent: Patient/Guardian gives verbal consent for treatment and assignment of benefits for services provided during this visit. Patient/Guardian expressed understanding and agreed to proceed.   Lorenda Hatchet 03/18/2022

## 2022-03-19 DIAGNOSIS — I152 Hypertension secondary to endocrine disorders: Secondary | ICD-10-CM | POA: Diagnosis not present

## 2022-03-19 DIAGNOSIS — D631 Anemia in chronic kidney disease: Secondary | ICD-10-CM | POA: Diagnosis not present

## 2022-03-19 DIAGNOSIS — I5032 Chronic diastolic (congestive) heart failure: Secondary | ICD-10-CM | POA: Diagnosis not present

## 2022-03-19 DIAGNOSIS — E1122 Type 2 diabetes mellitus with diabetic chronic kidney disease: Secondary | ICD-10-CM | POA: Diagnosis not present

## 2022-03-19 DIAGNOSIS — N184 Chronic kidney disease, stage 4 (severe): Secondary | ICD-10-CM | POA: Diagnosis not present

## 2022-03-19 DIAGNOSIS — E1159 Type 2 diabetes mellitus with other circulatory complications: Secondary | ICD-10-CM | POA: Diagnosis not present

## 2022-03-22 ENCOUNTER — Other Ambulatory Visit: Payer: Self-pay | Admitting: Family

## 2022-03-22 ENCOUNTER — Inpatient Hospital Stay: Payer: Medicare Other

## 2022-03-22 VITALS — BP 169/76 | HR 63 | Resp 18

## 2022-03-22 DIAGNOSIS — I70248 Atherosclerosis of native arteries of left leg with ulceration of other part of lower left leg: Secondary | ICD-10-CM | POA: Diagnosis not present

## 2022-03-22 DIAGNOSIS — D631 Anemia in chronic kidney disease: Secondary | ICD-10-CM | POA: Diagnosis not present

## 2022-03-22 DIAGNOSIS — F419 Anxiety disorder, unspecified: Secondary | ICD-10-CM | POA: Diagnosis not present

## 2022-03-22 DIAGNOSIS — E1140 Type 2 diabetes mellitus with diabetic neuropathy, unspecified: Secondary | ICD-10-CM | POA: Diagnosis not present

## 2022-03-22 DIAGNOSIS — M1711 Unilateral primary osteoarthritis, right knee: Secondary | ICD-10-CM | POA: Diagnosis not present

## 2022-03-22 DIAGNOSIS — E1151 Type 2 diabetes mellitus with diabetic peripheral angiopathy without gangrene: Secondary | ICD-10-CM | POA: Diagnosis not present

## 2022-03-22 DIAGNOSIS — E1122 Type 2 diabetes mellitus with diabetic chronic kidney disease: Secondary | ICD-10-CM | POA: Diagnosis not present

## 2022-03-22 DIAGNOSIS — I429 Cardiomyopathy, unspecified: Secondary | ICD-10-CM | POA: Diagnosis not present

## 2022-03-22 DIAGNOSIS — D509 Iron deficiency anemia, unspecified: Secondary | ICD-10-CM | POA: Diagnosis not present

## 2022-03-22 DIAGNOSIS — J449 Chronic obstructive pulmonary disease, unspecified: Secondary | ICD-10-CM | POA: Diagnosis not present

## 2022-03-22 DIAGNOSIS — D508 Other iron deficiency anemias: Secondary | ICD-10-CM

## 2022-03-22 DIAGNOSIS — M48061 Spinal stenosis, lumbar region without neurogenic claudication: Secondary | ICD-10-CM | POA: Diagnosis not present

## 2022-03-22 DIAGNOSIS — F330 Major depressive disorder, recurrent, mild: Secondary | ICD-10-CM | POA: Diagnosis not present

## 2022-03-22 DIAGNOSIS — J9621 Acute and chronic respiratory failure with hypoxia: Secondary | ICD-10-CM | POA: Diagnosis not present

## 2022-03-22 DIAGNOSIS — I70201 Unspecified atherosclerosis of native arteries of extremities, right leg: Secondary | ICD-10-CM | POA: Diagnosis not present

## 2022-03-22 DIAGNOSIS — I452 Bifascicular block: Secondary | ICD-10-CM | POA: Diagnosis not present

## 2022-03-22 DIAGNOSIS — L97828 Non-pressure chronic ulcer of other part of left lower leg with other specified severity: Secondary | ICD-10-CM | POA: Diagnosis not present

## 2022-03-22 DIAGNOSIS — M810 Age-related osteoporosis without current pathological fracture: Secondary | ICD-10-CM | POA: Diagnosis not present

## 2022-03-22 DIAGNOSIS — I5032 Chronic diastolic (congestive) heart failure: Secondary | ICD-10-CM | POA: Diagnosis not present

## 2022-03-22 DIAGNOSIS — D72829 Elevated white blood cell count, unspecified: Secondary | ICD-10-CM | POA: Diagnosis not present

## 2022-03-22 DIAGNOSIS — I152 Hypertension secondary to endocrine disorders: Secondary | ICD-10-CM | POA: Diagnosis not present

## 2022-03-22 DIAGNOSIS — E1165 Type 2 diabetes mellitus with hyperglycemia: Secondary | ICD-10-CM | POA: Diagnosis not present

## 2022-03-22 DIAGNOSIS — N184 Chronic kidney disease, stage 4 (severe): Secondary | ICD-10-CM | POA: Diagnosis not present

## 2022-03-22 DIAGNOSIS — E1159 Type 2 diabetes mellitus with other circulatory complications: Secondary | ICD-10-CM | POA: Diagnosis not present

## 2022-03-22 DIAGNOSIS — I480 Paroxysmal atrial fibrillation: Secondary | ICD-10-CM | POA: Diagnosis not present

## 2022-03-22 MED ORDER — SACUBITRIL-VALSARTAN 24-26 MG PO TABS: 1.0000 | ORAL_TABLET | Freq: Two times a day (BID) | ORAL | 3 refills | Status: DC

## 2022-03-22 MED ORDER — HYDRALAZINE HCL 25 MG PO TABS: 50.0000 mg | ORAL_TABLET | Freq: Three times a day (TID) | ORAL | 3 refills | Status: DC

## 2022-03-22 MED ORDER — SODIUM CHLORIDE 0.9 % IV SOLN
200.0000 mg | INTRAVENOUS | Status: DC
  Administered 2022-03-22: 200 mg via INTRAVENOUS
  Filled 2022-03-22: qty 200

## 2022-03-22 MED ORDER — SODIUM CHLORIDE 0.9 % IV SOLN
INTRAVENOUS | Status: DC
  Filled 2022-03-22: qty 250

## 2022-03-22 NOTE — Patient Instructions (Signed)

## 2022-03-22 NOTE — Progress Notes (Signed)
Entresto/ hydralazine doses updated per [place]

## 2022-03-23 DIAGNOSIS — E1159 Type 2 diabetes mellitus with other circulatory complications: Secondary | ICD-10-CM | POA: Diagnosis not present

## 2022-03-23 DIAGNOSIS — E1122 Type 2 diabetes mellitus with diabetic chronic kidney disease: Secondary | ICD-10-CM | POA: Diagnosis not present

## 2022-03-23 DIAGNOSIS — D631 Anemia in chronic kidney disease: Secondary | ICD-10-CM | POA: Diagnosis not present

## 2022-03-23 DIAGNOSIS — I152 Hypertension secondary to endocrine disorders: Secondary | ICD-10-CM | POA: Diagnosis not present

## 2022-03-23 DIAGNOSIS — I5032 Chronic diastolic (congestive) heart failure: Secondary | ICD-10-CM | POA: Diagnosis not present

## 2022-03-23 DIAGNOSIS — N184 Chronic kidney disease, stage 4 (severe): Secondary | ICD-10-CM | POA: Diagnosis not present

## 2022-03-24 ENCOUNTER — Other Ambulatory Visit (HOSPITAL_COMMUNITY): Payer: Self-pay

## 2022-03-25 DIAGNOSIS — E1122 Type 2 diabetes mellitus with diabetic chronic kidney disease: Secondary | ICD-10-CM | POA: Diagnosis not present

## 2022-03-25 DIAGNOSIS — I10 Essential (primary) hypertension: Secondary | ICD-10-CM | POA: Diagnosis not present

## 2022-03-25 DIAGNOSIS — R600 Localized edema: Secondary | ICD-10-CM | POA: Diagnosis not present

## 2022-03-25 DIAGNOSIS — N2581 Secondary hyperparathyroidism of renal origin: Secondary | ICD-10-CM | POA: Diagnosis not present

## 2022-03-25 DIAGNOSIS — N185 Chronic kidney disease, stage 5: Secondary | ICD-10-CM | POA: Diagnosis not present

## 2022-03-26 ENCOUNTER — Other Ambulatory Visit (INDEPENDENT_AMBULATORY_CARE_PROVIDER_SITE_OTHER): Payer: Self-pay | Admitting: Vascular Surgery

## 2022-03-26 DIAGNOSIS — E1159 Type 2 diabetes mellitus with other circulatory complications: Secondary | ICD-10-CM | POA: Diagnosis not present

## 2022-03-26 DIAGNOSIS — E1122 Type 2 diabetes mellitus with diabetic chronic kidney disease: Secondary | ICD-10-CM | POA: Diagnosis not present

## 2022-03-26 DIAGNOSIS — I5032 Chronic diastolic (congestive) heart failure: Secondary | ICD-10-CM | POA: Diagnosis not present

## 2022-03-26 DIAGNOSIS — D631 Anemia in chronic kidney disease: Secondary | ICD-10-CM | POA: Diagnosis not present

## 2022-03-26 DIAGNOSIS — N184 Chronic kidney disease, stage 4 (severe): Secondary | ICD-10-CM | POA: Diagnosis not present

## 2022-03-26 DIAGNOSIS — I7025 Atherosclerosis of native arteries of other extremities with ulceration: Secondary | ICD-10-CM

## 2022-03-26 DIAGNOSIS — I152 Hypertension secondary to endocrine disorders: Secondary | ICD-10-CM | POA: Diagnosis not present

## 2022-03-29 ENCOUNTER — Inpatient Hospital Stay: Payer: Medicare Other

## 2022-03-29 VITALS — BP 118/65 | HR 78 | Temp 97.0°F | Resp 17

## 2022-03-29 DIAGNOSIS — D508 Other iron deficiency anemias: Secondary | ICD-10-CM

## 2022-03-29 DIAGNOSIS — D509 Iron deficiency anemia, unspecified: Secondary | ICD-10-CM | POA: Diagnosis not present

## 2022-03-29 MED ORDER — SODIUM CHLORIDE 0.9 % IV SOLN
Freq: Once | INTRAVENOUS | Status: AC
  Filled 2022-03-29: qty 250

## 2022-03-29 MED ORDER — SODIUM CHLORIDE 0.9 % IV SOLN
200.0000 mg | INTRAVENOUS | Status: DC
  Administered 2022-03-29: 200 mg via INTRAVENOUS
  Filled 2022-03-29: qty 200

## 2022-03-29 MED ORDER — SODIUM CHLORIDE 0.9% FLUSH
10.0000 mL | Freq: Once | INTRAVENOUS | Status: AC | PRN
  Administered 2022-03-29: 10 mL
  Filled 2022-03-29: qty 10

## 2022-03-29 NOTE — Patient Instructions (Signed)

## 2022-03-29 NOTE — Progress Notes (Signed)
Patient tolerated Venofer infusion well, no questions/concerns voiced. Monitored 30 min post transfusion. Patient stable at discharge. VSS. AVS given.

## 2022-03-30 ENCOUNTER — Other Ambulatory Visit: Payer: Self-pay

## 2022-03-30 DIAGNOSIS — N184 Chronic kidney disease, stage 4 (severe): Secondary | ICD-10-CM

## 2022-03-30 DIAGNOSIS — E1122 Type 2 diabetes mellitus with diabetic chronic kidney disease: Secondary | ICD-10-CM | POA: Diagnosis not present

## 2022-03-30 DIAGNOSIS — I5032 Chronic diastolic (congestive) heart failure: Secondary | ICD-10-CM | POA: Diagnosis not present

## 2022-03-30 DIAGNOSIS — D631 Anemia in chronic kidney disease: Secondary | ICD-10-CM | POA: Diagnosis not present

## 2022-03-30 DIAGNOSIS — I152 Hypertension secondary to endocrine disorders: Secondary | ICD-10-CM | POA: Diagnosis not present

## 2022-03-30 DIAGNOSIS — E1159 Type 2 diabetes mellitus with other circulatory complications: Secondary | ICD-10-CM | POA: Diagnosis not present

## 2022-03-31 ENCOUNTER — Ambulatory Visit (INDEPENDENT_AMBULATORY_CARE_PROVIDER_SITE_OTHER): Payer: Medicare Other

## 2022-03-31 ENCOUNTER — Inpatient Hospital Stay (HOSPITAL_BASED_OUTPATIENT_CLINIC_OR_DEPARTMENT_OTHER): Payer: Medicare Other | Admitting: Medical Oncology

## 2022-03-31 ENCOUNTER — Encounter: Payer: Self-pay | Admitting: Medical Oncology

## 2022-03-31 ENCOUNTER — Inpatient Hospital Stay: Payer: Medicare Other

## 2022-03-31 ENCOUNTER — Ambulatory Visit (INDEPENDENT_AMBULATORY_CARE_PROVIDER_SITE_OTHER): Payer: Medicare Other | Admitting: Nurse Practitioner

## 2022-03-31 VITALS — BP 119/65 | HR 72 | Temp 97.7°F | Wt 227.0 lb

## 2022-03-31 DIAGNOSIS — D631 Anemia in chronic kidney disease: Secondary | ICD-10-CM | POA: Diagnosis not present

## 2022-03-31 DIAGNOSIS — D500 Iron deficiency anemia secondary to blood loss (chronic): Secondary | ICD-10-CM | POA: Diagnosis not present

## 2022-03-31 DIAGNOSIS — D75839 Thrombocytosis, unspecified: Secondary | ICD-10-CM | POA: Diagnosis not present

## 2022-03-31 DIAGNOSIS — D72829 Elevated white blood cell count, unspecified: Secondary | ICD-10-CM | POA: Diagnosis not present

## 2022-03-31 DIAGNOSIS — N184 Chronic kidney disease, stage 4 (severe): Secondary | ICD-10-CM

## 2022-03-31 DIAGNOSIS — D508 Other iron deficiency anemias: Secondary | ICD-10-CM

## 2022-03-31 DIAGNOSIS — D509 Iron deficiency anemia, unspecified: Secondary | ICD-10-CM | POA: Diagnosis not present

## 2022-03-31 LAB — IRON AND TIBC
Iron: 44 ug/dL (ref 28–170)
Saturation Ratios: 18 % (ref 10.4–31.8)
TIBC: 248 ug/dL — ABNORMAL LOW (ref 250–450)
UIBC: 204 ug/dL

## 2022-03-31 LAB — CBC WITH DIFFERENTIAL/PLATELET
Abs Immature Granulocytes: 0.03 10*3/uL (ref 0.00–0.07)
Basophils Absolute: 0.1 10*3/uL (ref 0.0–0.1)
Basophils Relative: 1 %
Eosinophils Absolute: 0.7 10*3/uL — ABNORMAL HIGH (ref 0.0–0.5)
Eosinophils Relative: 7 %
HCT: 35.7 % — ABNORMAL LOW (ref 36.0–46.0)
Hemoglobin: 10.3 g/dL — ABNORMAL LOW (ref 12.0–15.0)
Immature Granulocytes: 0 %
Lymphocytes Relative: 11 %
Lymphs Abs: 1.1 10*3/uL (ref 0.7–4.0)
MCH: 25.6 pg — ABNORMAL LOW (ref 26.0–34.0)
MCHC: 28.9 g/dL — ABNORMAL LOW (ref 30.0–36.0)
MCV: 88.6 fL (ref 80.0–100.0)
Monocytes Absolute: 0.9 10*3/uL (ref 0.1–1.0)
Monocytes Relative: 9 %
Neutro Abs: 7.4 10*3/uL (ref 1.7–7.7)
Neutrophils Relative %: 72 %
Platelets: 550 10*3/uL — ABNORMAL HIGH (ref 150–400)
RBC: 4.03 MIL/uL (ref 3.87–5.11)
RDW: 21.3 % — ABNORMAL HIGH (ref 11.5–15.5)
WBC: 10.3 10*3/uL (ref 4.0–10.5)
nRBC: 0 % (ref 0.0–0.2)

## 2022-03-31 LAB — FERRITIN: Ferritin: 142 ng/mL (ref 11–307)

## 2022-03-31 NOTE — Patient Instructions (Signed)
We will call within the next 1-2 days to discuss the rest of your labs that have not returned from today's visit

## 2022-03-31 NOTE — Progress Notes (Signed)
Hematology/Oncology Consult note [facility] [phone] [phone]  Patient Care Team: Gwyneth Sprout, FNP as PCP - General (Family Medicine) Gabriel Carina, Betsey Holiday, MD as Physician Assistant (Endocrinology) Deboraha Sprang, MD as Consulting Physician (Cardiology) Rainey Pines, MD as Referring Physician (Psychiatry) Ralene Bathe, MD as Consulting Physician (Dermatology) Dimmig, Marcello Moores, MD as Referring Physician (Orthopedic Surgery) Schnier, Dolores Lory, MD (Vascular Surgery) Lynnell Dike, [place] (Optometry) Germaine Pomfret, [facility] (Pharmacist) Tyler Pita, MD as Consulting Physician (Pulmonary Disease) Neldon Labella, RN as Case Manager   Name of the patient: Jocelyn Wells  [id number]  03/17/1955    Reason for original referral-leukocytosis  Currently being monitored for: Leukocytosis, thrombocytosis, IDA   Referring physician-Elise payne FNP  Date of visit: 03/31/22   History of presenting illness-patient is a 67 year old female with a past medical history significant for smoking, CKD, hypertension hyperlipidemia, COPD among other medical problems.   Although she was originally referred for leukocytosis, this was thought to be reactive and has actually improved/resolved. She was incidentally found to have fairly significant IDA. She has been treated with multiple rounds of IV Venofer which she reports she has tolerated well. She has had improvement in her Hgb as well as her thrombocytosis. Today she reports that overall she is doing well. Since her last visit she has been seen by GI where she had an endoscopy/colonoscopy revealing one benign polyp per pt. No sources of bleeding. She denies any bleeding/bruising episodes. She reports that there is no vaginal bleeding.  Denies any blood loss in her stool or urine.  Denies any dark melanotic stools.  Denies any consistent use of NSAIDs  Wt Readings from Last 3 Encounters:  03/31/22 227 lb  (103 kg)  02/26/22 215 lb (97.5 kg)  02/15/22 218 lb (98.9 kg)     ECOG PS- 2  Pain scale- 3   Review of systems- Review of Systems  Constitutional:  Positive for malaise/fatigue. Negative for chills, fever and weight loss.  HENT:  Negative for congestion, ear discharge and nosebleeds.   Eyes:  Negative for blurred vision.  Respiratory:  Negative for cough, hemoptysis, sputum production, shortness of breath and wheezing.   Cardiovascular:  Negative for chest pain, palpitations, orthopnea and claudication.  Gastrointestinal:  Negative for abdominal pain, blood in stool, constipation, diarrhea, heartburn, melena, nausea and vomiting.  Genitourinary:  Negative for dysuria, flank pain, frequency, hematuria and urgency.  Musculoskeletal:  Negative for back pain, joint pain and myalgias.  Skin:  Negative for rash.  Neurological:  Negative for dizziness, tingling, focal weakness, seizures, weakness and headaches.  Endo/Heme/Allergies:  Does not bruise/bleed easily.  Psychiatric/Behavioral:  Negative for depression and suicidal ideas. The patient does not have insomnia.     Allergies  Allergen Reactions   Codeine Nausea And Vomiting   Ivp Dye [Iodinated Contrast Media]     Patients states the IV Dye shuts her kidneys down    Patient Active Problem List   Diagnosis Date Noted   Polyp of ascending colon 02/15/2022   Symptomatic anemia 01/01/2022   Foot pain, right 12/22/2021   Pain of right lower extremity 12/22/2021   Iron deficiency anemia 12/17/2021   Personal history of nicotine dependence 12/04/2021   Leukocytosis 12/04/2021   Long toenail 12/04/2021   Screening for colon cancer 12/04/2021   Depression, recurrent ([place]) 12/04/2021   Hospital discharge follow-up 09/02/2021   Hyperkalemia 08/15/2021   Acute on chronic diastolic CHF (congestive heart failure) ([place]) 08/14/2021  Atherosclerosis of native arteries of the extremities with ulceration ([place]) 08/12/2021   Positive  depression screening 07/14/2021   DOE (dyspnea on exertion) 07/14/2021   Bilateral leg edema 07/14/2021   Frequent falls 04/13/2021   Anemia in chronic kidney disease 03/18/2021   Heart failure, unspecified ([place]) 03/18/2021   Hyperparathyroidism due to renal insufficiency ([place]) 03/18/2021   Chronic depression 03/18/2021   Overweight 03/18/2021   Poor appetite 02/13/2021   MDD (major depressive disorder), recurrent episode, mild ([place]) 02/13/2021   Benign hypertensive kidney disease with chronic kidney disease 10/14/2020   CKD (chronic kidney disease), stage IV ([place]) 05/31/2020   Compression fracture of L1 lumbar vertebra ([place]) 01/30/2019   Compression fracture of body of thoracic vertebra ([place]) 01/30/2019   Lumbar foraminal stenosis (RIGHT L1) 01/30/2019   Spinal stenosis of lumbar region with neurogenic claudication (L4-L5) 01/30/2019   Osteoporosis, post-menopausal 04/14/2018   Vitamin D deficiency 04/14/2018   Chronic deep vein thrombosis (DVT) ([place]) 02/09/2018   Post-menopausal 11/17/2017   Aortic atherosclerosis ([place]) 11/08/2017   Snoring 04/24/2015   Sleep paralysis, recurrent isolated 04/24/2015   Hypersomnia with sleep apnea 04/24/2015   Cataplexy 04/24/2015   Morbid obesity due to excess calories ([place]) 04/24/2015   COPD (chronic obstructive pulmonary disease) ([place]) XX123456   Embolic stroke ([place]) 99991111   Acute renal failure superimposed on stage 4 chronic kidney disease ([place]) 01/29/2015   Dizziness 07/05/2014   Essential hypertension 07/05/2014   Carbuncle and furuncle 07/05/2014   Pain of perianal area 07/05/2014   Guttate psoriasis 07/05/2014   H/O: osteoarthritis 06/05/2014   Anxiety, generalized 06/05/2014   Microalbuminuria 12/23/2013   Long term current use of insulin ([place]) 12/23/2013   Type 2 diabetes mellitus with other diabetic kidney complication ([place]) 123XX123   Disturbance of skin sensation 09/02/2006   Difficulty hearing 07/25/2006   Cephalalgia  05/27/2006   Hyperlipidemia associated with type 2 diabetes mellitus ([place]) 10/22/2005   Arthritis, degenerative 10/22/2005     Past Medical History:  Diagnosis Date   Anemia    Anxiety    Cardiomyopathy ([place])    new to her Jan 2017   CHF (congestive heart failure) ([place])    Chronic kidney disease    COPD (chronic obstructive pulmonary disease) ([place])    Depression    Diabetes mellitus, type II ([place])    HTN (hypertension)    Leukocytosis    Obesity    Osteoporosis    PONV (postoperative nausea and vomiting)    Stroke [facility])    Jan 2017     Past Surgical History:  Procedure Laterality Date   ABDOMINAL HYSTERECTOMY     ANKLE FRACTURE SURGERY Left 02/09/2000   BILATERAL SALPINGOOPHORECTOMY  02/08/1998   CARDIAC CATHETERIZATION N/A 03/25/2015   Procedure: Right/Left Heart Cath and Coronary Angiography;  Surgeon: Belva Crome, MD;  Location: [place] CV LAB;  Service: Cardiovascular;  Laterality: N/A;   CESAREAN SECTION  02/09/1976   COLONOSCOPY WITH PROPOFOL N/A 09/22/2016   Procedure: COLONOSCOPY WITH PROPOFOL;  Surgeon: Manya Silvas, MD;  Location: [facility] ENDOSCOPY;  Service: Endoscopy;  Laterality: N/A;   COLONOSCOPY WITH PROPOFOL N/A 02/15/2022   Procedure: COLONOSCOPY WITH PROPOFOL;  Surgeon: Lin Landsman, MD;  Location: [facility] ENDOSCOPY;  Service: Gastroenterology;  Laterality: N/A;   EP IMPLANTABLE DEVICE N/A 08/05/2015   Procedure: Loop Recorder Insertion;  Surgeon: Deboraha Sprang, MD;  Location: [place] [facility];  Service: Cardiovascular;  Laterality: N/A;   ESOPHAGOGASTRODUODENOSCOPY N/A 02/15/2022   Procedure:  ESOPHAGOGASTRODUODENOSCOPY (EGD);  Surgeon: Lin Landsman, MD;  Location: [facility] ENDOSCOPY;  Service: Gastroenterology;  Laterality: N/A;   ESOPHAGOGASTRODUODENOSCOPY (EGD) WITH PROPOFOL N/A 09/22/2016   Procedure: ESOPHAGOGASTRODUODENOSCOPY (EGD) WITH PROPOFOL;  Surgeon: Manya Silvas, MD;  Location: [facility] ENDOSCOPY;  Service: Endoscopy;   Laterality: N/A;   ESOPHAGOGASTRODUODENOSCOPY (EGD) WITH PROPOFOL N/A 12/08/2016   Procedure: ESOPHAGOGASTRODUODENOSCOPY (EGD) WITH PROPOFOL;  Surgeon: Manya Silvas, MD;  Location: [facility] ENDOSCOPY;  Service: Endoscopy;  Laterality: N/A;   FRACTURE SURGERY     KNEE ARTHROSCOPY Left 02/09/2003   LOWER EXTREMITY ANGIOGRAPHY Left 08/12/2021   Procedure: Lower Extremity Angiography;  Surgeon: Katha Cabal, MD;  Location: [place] [facility];  Service: Cardiovascular;  Laterality: Left;   TEE WITHOUT CARDIOVERSION N/A 01/31/2015   Procedure: TRANSESOPHAGEAL ECHOCARDIOGRAM (TEE);  Surgeon: Lelon Perla, MD;  Location: [facility] ENDOSCOPY;  Service: Cardiovascular;  Laterality: N/A;   TEMPORARY DIALYSIS CATHETER N/A 08/14/2021   Procedure: TEMPORARY DIALYSIS CATHETER;  Surgeon: Katha Cabal, MD;  Location: [place] [facility] LAB;  Service: Cardiovascular;  Laterality: N/A;   TUBAL LIGATION  02/09/1976   ULNAR NERVE TRANSPOSITION  02/08/2006    Social History   Socioeconomic History   Marital status: Married    Spouse name: Not on file   Number of children: 2   Years of education: Not on file   Highest education level: Bachelor's degree (e.g., BA, AB, BS)  Occupational History   Occupation: disabled    Comment: retired  Tobacco Use   Smoking status: Some Days    Packs/day: 0.50    Years: 47.00    Total pack years: 23.50    Types: Cigarettes    Start date: 02/08/1974   Smokeless tobacco: Never   Tobacco comments:    Quit 01/18/2022  Vaping Use   Vaping Use: Never used  Substance and Sexual Activity   Alcohol use: No    Alcohol/week: 0.0 standard drinks of alcohol   Drug use: No   Sexual activity: Yes    Birth control/protection: None  Other Topics Concern   Not on file  Social History Narrative   Lives at home with stepson and husband, dogs and cats   Caffeine  Drinks sweet tea.   Right handed.    Social Determinants of Health   Financial Resource Strain: Low Risk   (12/11/2021)   Overall Financial Resource Strain (CARDIA)    Difficulty of Paying Living Expenses: Not very hard  Food Insecurity: No Food Insecurity (01/05/2022)   Hunger Vital Sign    Worried About Running Out of Food in the Last Year: Never true    Ran Out of Food in the Last Year: Never true  Transportation Needs: No Transportation Needs (01/05/2022)   PRAPARE - Hydrologist (Medical): No    Lack of Transportation (Non-Medical): No  Physical Activity: Inactive (12/11/2021)   Exercise Vital Sign    Days of Exercise per Week: 0 days    Minutes of Exercise per Session: 0 min  Stress: Stress Concern Present (12/11/2021)   [place]    Feeling of Stress : To some extent  Social Connections: Moderately Isolated (12/11/2021)   Social Connection and Isolation Panel [NHANES]    Frequency of Communication with Friends and Family: More than three times a week    Frequency of Social Gatherings with Friends and Family: Once a week    Attends Religious Services: Never  Active Member of Clubs or Organizations: No    Attends Archivist Meetings: Never    Marital Status: Married  Human resources officer Violence: Not At Risk (01/02/2022)   Humiliation, Afraid, Rape, and Kick questionnaire    Fear of Current or Ex-Partner: No    Emotionally Abused: No    Physically Abused: No    Sexually Abused: No     Family History  Problem Relation Age of Onset   Heart disease Mother        died from CHF   Asthma Mother    Diabetes Mother    Heart disease Father    Aneurysm Father    COPD Brother    Diabetes Brother    Alcohol abuse Paternal Aunt    Cancer Maternal Grandmother        unknownorigin   Anemia Neg Hx    Arrhythmia Neg Hx    Clotting disorder Neg Hx    Fainting Neg Hx    Heart attack Neg Hx    Heart failure Neg Hx    Hyperlipidemia Neg Hx    Hypertension Neg Hx      Current  Outpatient Medications:    albuterol (VENTOLIN HFA) 108 (90 Base) MCG/ACT inhaler, TAKE 2 PUFFS BY MOUTH EVERY 6 HOURS AS NEEDED FOR WHEEZE OR SHORTNESS OF BREATH (Patient taking differently: Inhale 2 puffs into the lungs every 6 (six) hours as needed for wheezing or shortness of breath.), Disp: 8.5 each, Rfl: 2   apixaban (ELIQUIS) 5 MG TABS tablet, Take 1 tablet (5 mg total) by mouth 2 (two) times daily., Disp: 180 tablet, Rfl: 2   atorvastatin (LIPITOR) 40 MG tablet, Take 1 tablet (40 mg total) by mouth daily., Disp: 90 tablet, Rfl: 3   buPROPion (WELLBUTRIN SR) 150 MG 12 hr tablet, Take 1 tablet (150 mg total) by mouth 2 (two) times daily., Disp: 180 tablet, Rfl: 1   carvedilol (COREG) 25 MG tablet, Take 1 tablet (25 mg total) by mouth 2 (two) times daily with a meal., Disp: 60 tablet, Rfl: 0   clopidogrel (PLAVIX) 75 MG tablet, Take 1 tablet (75 mg total) by mouth daily., Disp: 90 tablet, Rfl: 3   Dulaglutide 3 MG/0.5ML SOPN, Inject into the skin once a week., Disp: , Rfl:    fluticasone (FLONASE) 50 MCG/ACT nasal spray, Place 2 sprays into both nostrils daily., Disp: 16 g, Rfl: 6   Fluticasone-Umeclidin-Vilant (TRELEGY ELLIPTA) 100-62.5-25 MCG/ACT AEPB, Inhale 1 puff into the lungs daily., Disp: 28 each, Rfl: 11   FUROSCIX 80 MG/10ML CTKT, Inject into the skin., Disp: , Rfl:    gabapentin (NEURONTIN) 300 MG capsule, Take 300 mg by mouth 2 (two) times daily., Disp: , Rfl:    glucose blood test strip, OneTouch Verio strips, Disp: , Rfl:    hydrALAZINE (APRESOLINE) 25 MG tablet, Take 2 tablets (50 mg total) by mouth 3 (three) times daily., Disp: 270 tablet, Rfl: 3   insulin aspart (NOVOLOG) 100 UNIT/ML injection, Insulin pump, Disp: , Rfl:    Insulin Disposable Pump (OMNIPOD DASH PODS, GEN 4,) MISC, USE 1 POD EACH EVERY 72    HOURS, Disp: , Rfl:    Insulin Human (INSULIN PUMP) SOLN, Per endocrinoloy, Disp: , Rfl:    loratadine (CLARITIN) 10 MG tablet, Take 1 tablet by mouth daily., Disp: , Rfl:     meclizine (ANTIVERT) 12.5 MG tablet, Take 1 tablet (12.5 mg total) by mouth 3 (three) times daily as needed for dizziness., Disp: 30 tablet,  Rfl: 0   OXYGEN, Place 2 L into the nose continuous., Disp: , Rfl:    pantoprazole (PROTONIX) 40 MG tablet, Take 1 tablet (40 mg total) by mouth 2 (two) times daily., Disp: 60 tablet, Rfl: 0   PARoxetine (PAXIL) 40 MG tablet, Take 1 tablet (40 mg total) by mouth daily., Disp: 90 tablet, Rfl: 1   sacubitril-valsartan (ENTRESTO) 24-26 MG, Take 1 tablet by mouth 2 (two) times daily., Disp: 60 tablet, Rfl: 3   Spacer/Aero-Holding Chambers [facility] DIAMOND) MISC, Use with inhaler to ensure medication is delivered throughout lungs, Disp: 1 each, Rfl: 0   torsemide (DEMADEX) 20 MG tablet, Take 1 tablet (20 mg total) by mouth every Monday, Wednesday, and Friday., Disp: 36 tablet, Rfl: 3   Vitamin D, Ergocalciferol, (DRISDOL) 1.25 MG (50000 UNIT) CAPS capsule, Take 50,000 Units by mouth every 7 (seven) days., Disp: , Rfl:    Fluticasone-Umeclidin-Vilant (TRELEGY ELLIPTA) 100-62.5-25 MCG/ACT AEPB, Inhale 1 puff into the lungs daily. (Patient not taking: Reported on 02/03/2022), Disp: 14 each, Rfl: 0   Physical exam:  Vitals:   03/31/22 1042  BP: 119/65  Pulse: 72  Temp: 97.7 F (36.5 C)  TempSrc: Tympanic  SpO2: 94%  Weight: 227 lb (103 kg)   Physical Exam Constitutional:      General: She is not in acute distress. Cardiovascular:     Rate and Rhythm: Normal rate and regular rhythm.     Heart sounds: Normal heart sounds.  Pulmonary:     Effort: Pulmonary effort is normal.     Breath sounds: Normal breath sounds.  Abdominal:     General: Bowel sounds are normal.     Palpations: Abdomen is soft.  Skin:    General: Skin is warm and dry.  Neurological:     Mental Status: She is alert and oriented to person, place, and time.  Psychiatric:     Comments: Flat affect          Latest Ref Rng & Units 02/26/2022    4:25 PM  CMP  Glucose 70 - 99  mg/dL 299   BUN 8 - 23 mg/dL 52   Creatinine 0.44 - 1.00 mg/dL 3.27   Sodium 135 - 145 mmol/L 135   Potassium 3.5 - 5.1 mmol/L 5.0   Chloride 98 - 111 mmol/L 102   CO2 22 - 32 mmol/L 26   Calcium 8.9 - 10.3 mg/dL 9.0       Latest Ref Rng & Units 03/31/2022   10:27 AM  CBC  WBC 4.0 - 10.5 K/uL 10.3   Hemoglobin 12.0 - 15.0 g/dL 10.3   Hematocrit 36.0 - 46.0 % 35.7   Platelets 150 - 400 K/uL 550     No images are attached to the encounter.  No results found.  Assessment and plan- Patient is a 67 y.o. female referred for leukocytosis Encounter Diagnoses  Name Primary?   Anemia in stage 4 chronic kidney disease (HCC) Yes   Iron deficiency anemia due to chronic blood loss    Leukocytosis, unspecified type    Thrombocytosis     Leukocytosis has resolved- again likely reactive. Will monitor.   Thrombocytosis: Chronic- likely secondary to IDA however has not fully normalized with improvement in recent iron saturation values. We will need to continue to monitor. If fails to improve further additional work up suggested.   Anemia: Chronic. Originally her Hgb was 7.2 with an MCV of 80.9. Following IV infusions her Hgb has trended up to 10.3  with an MCV of 88.6 though it should be mentioned that 1 month ago on labs her Hgb was 12.2. Had Endo/Colo on 02/15/2022 - one 5 mm sessile polyp on colonoscopy-  endoscopy revealed mild chronic inflammation of gastric mucous but negative for H. Pylori or significant histopathological changes. Still no known sources of bleeding. She is still on Eliquis. Today ferritin is 142 (up from 4) and iron saturation is 18%. She may soon benefit from EPO given her CKD. I would recommend that she follow up in 1 month with labs including labs assessing erythropoietin level.   Disposition: RTC ~ 1 month as planned (05/10/2022)   Visit Diagnosis 1. Anemia in stage 4 chronic kidney disease ([place])   2. Iron deficiency anemia due to chronic blood loss   3.  Leukocytosis, unspecified type   4. Thrombocytosis     Nelwyn Salisbury PA-C [place] [facility] 03/31/2022

## 2022-04-01 ENCOUNTER — Telehealth: Payer: Self-pay | Admitting: Gastroenterology

## 2022-04-01 NOTE — Telephone Encounter (Signed)
Patient wants to move 04/19/2022. Informed patient she does not need a driver for this procedure and patient states she does not drive. They can do any day but Thursdays

## 2022-04-01 NOTE — Telephone Encounter (Signed)
Patient called to reschedule upcoming procedure. States she does not have anyone to bring her to her appointment.

## 2022-04-02 ENCOUNTER — Encounter: Payer: Self-pay | Admitting: Oncology

## 2022-04-02 ENCOUNTER — Telehealth: Payer: Self-pay | Admitting: Licensed Clinical Social Worker

## 2022-04-02 DIAGNOSIS — E1159 Type 2 diabetes mellitus with other circulatory complications: Secondary | ICD-10-CM | POA: Diagnosis not present

## 2022-04-02 DIAGNOSIS — N184 Chronic kidney disease, stage 4 (severe): Secondary | ICD-10-CM | POA: Diagnosis not present

## 2022-04-02 DIAGNOSIS — I5032 Chronic diastolic (congestive) heart failure: Secondary | ICD-10-CM | POA: Diagnosis not present

## 2022-04-02 DIAGNOSIS — D631 Anemia in chronic kidney disease: Secondary | ICD-10-CM | POA: Diagnosis not present

## 2022-04-02 DIAGNOSIS — E1122 Type 2 diabetes mellitus with diabetic chronic kidney disease: Secondary | ICD-10-CM | POA: Diagnosis not present

## 2022-04-02 DIAGNOSIS — I152 Hypertension secondary to endocrine disorders: Secondary | ICD-10-CM | POA: Diagnosis not present

## 2022-04-02 NOTE — Telephone Encounter (Signed)
CSW received request from Matilde Bash, Geophysical data processor for [employer] to enroll patient into the [employer]. CSW will follow up. Raquel Sarna, [place], [place]

## 2022-04-02 NOTE — Progress Notes (Unsigned)
Virtual Visit via Telephone Note  I connected with Jocelyn Wells on 04/07/22 at  8:40 AM EST by telephone and verified that I am speaking with the correct person using two identifiers.  Location: Patient: home Provider: office Persons participated in the visit- patient, provider    I discussed the limitations, risks, security and privacy concerns of performing an evaluation and management service by telephone and the availability of in person appointments. I also discussed with the patient that there may be a patient responsible charge related to this service. The patient expressed understanding and agreed to proceed.    I discussed the assessment and treatment plan with the patient. The patient was provided an opportunity to ask questions and all were answered. The patient agreed with the plan and demonstrated an understanding of the instructions.   The patient was advised to call back or seek an in-person evaluation if the symptoms worsen or if the condition fails to improve as anticipated.  I provided 12 minutes of non-face-to-face time during this encounter.   Norman Clay, MD      [facility] MD/PA/NP OP Progress Note  04/07/2022 9:02 AM Jocelyn Wells  MRN:  [id number]  Chief Complaint:  Chief Complaint  Patient presents with   Follow-up   HPI:  This is a follow-up appointment for depression and PTSD.  She states that she has been doing good.  She is on a lot of medication.  She struggles with pain, and is now wearing oxygen at night and during the day at times.  She spends time watching TV, taking care of her 2 dogs.  She reports good support from her son, who helps her to feel the medication so that she can take as instructed.  Although she feels down at times, she is able to come out of it.  She feels anxious at times.  She denies SI.  She has occasional insomnia.  She feels comfortable to stay on the current medication.    Wt Readings from Last 3 Encounters:  03/31/22 227 lb  (103 kg)  02/26/22 215 lb (97.5 kg)  02/15/22 218 lb (98.9 kg)     Daily routine: she does not have any routine. 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 She lost job as she was there "so long." She got disability for depression, followed by stroke.  Support: best friend, husband Household: husband, two dogs Marital status: married for 24 years, married four times Number of children: 2 and 2 step children. She has 3 grandchildren (age 47, 71, 44) Her parents were separated, and she lived with her mother, brother. Both of her parents passed away  Visit Diagnosis:    ICD-10-CM   1. PTSD (post-traumatic stress disorder)  F43.10     2. MDD (major depressive disorder), recurrent, in partial remission (HCC)  F33.41 PARoxetine (PAXIL) 40 MG tablet      Past Psychiatric History: Please see initial evaluation for full details. I have reviewed the history. No updates at this time.     Past Medical History:  Past Medical History:  Diagnosis Date   Anemia    Anxiety    Cardiomyopathy ([place])    new to her Jan 2017   CHF (congestive heart failure) ([place])    Chronic kidney disease    COPD (chronic obstructive pulmonary disease) (HCC)    Depression    Diabetes mellitus, type II (HCC)    HTN (hypertension)  Leukocytosis    Obesity    Osteoporosis    PONV (postoperative nausea and vomiting)    Stroke [facility])    Jan 2017    Past Surgical History:  Procedure Laterality Date   ABDOMINAL HYSTERECTOMY     ANKLE FRACTURE SURGERY Left 02/09/2000   BILATERAL SALPINGOOPHORECTOMY  02/08/1998   CARDIAC CATHETERIZATION N/A 03/25/2015   Procedure: Right/Left Heart Cath and Coronary Angiography;  Surgeon: Belva Crome, MD;  Location: [place] CV LAB;  Service: Cardiovascular;  Laterality: N/A;   CESAREAN SECTION  02/09/1976   COLONOSCOPY WITH PROPOFOL N/A 09/22/2016   Procedure: COLONOSCOPY WITH PROPOFOL;  Surgeon:  Manya Silvas, MD;  Location: [facility] ENDOSCOPY;  Service: Endoscopy;  Laterality: N/A;   COLONOSCOPY WITH PROPOFOL N/A 02/15/2022   Procedure: COLONOSCOPY WITH PROPOFOL;  Surgeon: Lin Landsman, MD;  Location: [facility] ENDOSCOPY;  Service: Gastroenterology;  Laterality: N/A;   EP IMPLANTABLE DEVICE N/A 08/05/2015   Procedure: Loop Recorder Insertion;  Surgeon: Deboraha Sprang, MD;  Location: [place] CV LAB;  Service: Cardiovascular;  Laterality: N/A;   ESOPHAGOGASTRODUODENOSCOPY N/A 02/15/2022   Procedure: ESOPHAGOGASTRODUODENOSCOPY (EGD);  Surgeon: Lin Landsman, MD;  Location: [facility] ENDOSCOPY;  Service: Gastroenterology;  Laterality: N/A;   ESOPHAGOGASTRODUODENOSCOPY (EGD) WITH PROPOFOL N/A 09/22/2016   Procedure: ESOPHAGOGASTRODUODENOSCOPY (EGD) WITH PROPOFOL;  Surgeon: Manya Silvas, MD;  Location: [facility] ENDOSCOPY;  Service: Endoscopy;  Laterality: N/A;   ESOPHAGOGASTRODUODENOSCOPY (EGD) WITH PROPOFOL N/A 12/08/2016   Procedure: ESOPHAGOGASTRODUODENOSCOPY (EGD) WITH PROPOFOL;  Surgeon: Manya Silvas, MD;  Location: [facility] ENDOSCOPY;  Service: Endoscopy;  Laterality: N/A;   FRACTURE SURGERY     KNEE ARTHROSCOPY Left 02/09/2003   LOWER EXTREMITY ANGIOGRAPHY Left 08/12/2021   Procedure: Lower Extremity Angiography;  Surgeon: Katha Cabal, MD;  Location: [place] CV LAB;  Service: Cardiovascular;  Laterality: Left;   TEE WITHOUT CARDIOVERSION N/A 01/31/2015   Procedure: TRANSESOPHAGEAL ECHOCARDIOGRAM (TEE);  Surgeon: Lelon Perla, MD;  Location: [facility] ENDOSCOPY;  Service: Cardiovascular;  Laterality: N/A;   TEMPORARY DIALYSIS CATHETER N/A 08/14/2021   Procedure: TEMPORARY DIALYSIS CATHETER;  Surgeon: Katha Cabal, MD;  Location: [place] CV LAB;  Service: Cardiovascular;  Laterality: N/A;   TUBAL LIGATION  02/09/1976   ULNAR NERVE TRANSPOSITION  02/08/2006    Family Psychiatric History: Please see initial evaluation for full details. I have reviewed the  history. No updates at this time.     Family History:  Family History  Problem Relation Age of Onset   Heart disease Mother        died from CHF   Asthma Mother    Diabetes Mother    Heart disease Father    Aneurysm Father    COPD Brother    Diabetes Brother    Alcohol abuse Paternal Aunt    Cancer Maternal Grandmother        unknownorigin   Anemia Neg Hx    Arrhythmia Neg Hx    Clotting disorder Neg Hx    Fainting Neg Hx    Heart attack Neg Hx    Heart failure Neg Hx    Hyperlipidemia Neg Hx    Hypertension Neg Hx     Social History:  Social History   Socioeconomic History   Marital status: Married    Spouse name: Not on file   Number of children: 2   Years of education: Not on file   Highest education level: Bachelor's degree (e.g., BA, AB, BS)  Occupational History  Occupation: disabled    Comment: retired  Tobacco Use   Smoking status: Some Days    Packs/day: 0.50    Years: 47.00    Total pack years: 23.50    Types: Cigarettes    Start date: 02/08/1974   Smokeless tobacco: Never   Tobacco comments:    Quit 01/18/2022  Vaping Use   Vaping Use: Never used  Substance and Sexual Activity   Alcohol use: No    Alcohol/week: 0.0 standard drinks of alcohol   Drug use: No   Sexual activity: Yes    Birth control/protection: None  Other Topics Concern   Not on file  Social History Narrative   Lives at home with stepson and husband, dogs and cats   Caffeine  Drinks sweet tea.   Right handed.    Social Determinants of Health   Financial Resource Strain: Low Risk  (12/11/2021)   Overall Financial Resource Strain (CARDIA)    Difficulty of Paying Living Expenses: Not very hard  Food Insecurity: No Food Insecurity (01/05/2022)   Hunger Vital Sign    Worried About Running Out of Food in the Last Year: Never true    Ran Out of Food in the Last Year: Never true  Transportation Needs: No Transportation Needs (01/05/2022)   PRAPARE - Radiographer, therapeutic (Medical): No    Lack of Transportation (Non-Medical): No  Physical Activity: Inactive (12/11/2021)   Exercise Vital Sign    Days of Exercise per Week: 0 days    Minutes of Exercise per Session: 0 min  Stress: Stress Concern Present (12/11/2021)   [place]    Feeling of Stress : To some extent  Social Connections: Moderately Isolated (12/11/2021)   Social Connection and Isolation Panel [NHANES]    Frequency of Communication with Friends and Family: More than three times a week    Frequency of Social Gatherings with Friends and Family: Once a week    Attends Religious Services: Never    Marine scientist or Organizations: No    Attends Archivist Meetings: Never    Marital Status: Married    Allergies:  Allergies  Allergen Reactions   Codeine Nausea And Vomiting   Ivp Dye [Iodinated Contrast Media]     Patients states the IV Dye shuts her kidneys down    Metabolic Disorder Labs: Lab Results  Component Value Date   HGBA1C 10.2 (H) 11/23/2021   MPG 246 05/31/2020   MPG 303.44 10/06/2017   No results found for: "PROLACTIN" Lab Results  Component Value Date   CHOL 107 12/04/2021   TRIG 112 12/04/2021   HDL 36 (L) 12/04/2021   CHOLHDL 3.0 12/04/2021   VLDL 64 (H) 10/06/2017   LDLCALC 50 12/04/2021   LDLCALC 186 (H) 10/20/2020   Lab Results  Component Value Date   TSH 2.977 10/06/2021    Therapeutic Level Labs: No results found for: "LITHIUM" No results found for: "VALPROATE" No results found for: "CBMZ"  Current Medications: Current Outpatient Medications  Medication Sig Dispense Refill   albuterol (VENTOLIN HFA) 108 (90 Base) MCG/ACT inhaler TAKE 2 PUFFS BY MOUTH EVERY 6 HOURS AS NEEDED FOR WHEEZE OR SHORTNESS OF BREATH (Patient taking differently: Inhale 2 puffs into the lungs every 6 (six) hours as needed for wheezing or shortness of breath.) 8.5 each 2   apixaban  (ELIQUIS) 5 MG TABS tablet Take 1 tablet (5 mg total) by mouth  2 (two) times daily. 180 tablet 2   atorvastatin (LIPITOR) 40 MG tablet Take 1 tablet (40 mg total) by mouth daily. 90 tablet 3   buPROPion (WELLBUTRIN SR) 150 MG 12 hr tablet Take 1 tablet (150 mg total) by mouth 2 (two) times daily. 180 tablet 1   carvedilol (COREG) 25 MG tablet Take 1 tablet (25 mg total) by mouth 2 (two) times daily with a meal. 60 tablet 0   clopidogrel (PLAVIX) 75 MG tablet Take 1 tablet (75 mg total) by mouth daily. 90 tablet 3   Dulaglutide 3 MG/0.5ML SOPN Inject into the skin once a week.     fluticasone (FLONASE) 50 MCG/ACT nasal spray Place 2 sprays into both nostrils daily. 16 g 6   Fluticasone-Umeclidin-Vilant (TRELEGY ELLIPTA) 100-62.5-25 MCG/ACT AEPB Inhale 1 puff into the lungs daily. 28 each 11   Fluticasone-Umeclidin-Vilant (TRELEGY ELLIPTA) 100-62.5-25 MCG/ACT AEPB Inhale 1 puff into the lungs daily. (Patient not taking: Reported on 02/03/2022) 14 each 0   FUROSCIX 80 MG/10ML CTKT Inject into the skin.     gabapentin (NEURONTIN) 300 MG capsule Take 300 mg by mouth 2 (two) times daily.     glucose blood test strip OneTouch Verio strips     hydrALAZINE (APRESOLINE) 25 MG tablet Take 2 tablets (50 mg total) by mouth 3 (three) times daily. 270 tablet 3   insulin aspart (NOVOLOG) 100 UNIT/ML injection Insulin pump     Insulin Disposable Pump (OMNIPOD DASH PODS, GEN 4,) MISC USE 1 POD EACH EVERY 72    HOURS     Insulin Human (INSULIN PUMP) SOLN Per endocrinoloy     loratadine (CLARITIN) 10 MG tablet Take 1 tablet by mouth daily.     meclizine (ANTIVERT) 12.5 MG tablet Take 1 tablet (12.5 mg total) by mouth 3 (three) times daily as needed for dizziness. 30 tablet 0   OXYGEN Place 2 L into the nose continuous.     pantoprazole (PROTONIX) 40 MG tablet Take 1 tablet (40 mg total) by mouth 2 (two) times daily. 60 tablet 0   [START ON 04/10/2022] PARoxetine (PAXIL) 40 MG tablet Take 1 tablet (40 mg total) by  mouth daily. 90 tablet 1   sacubitril-valsartan (ENTRESTO) 24-26 MG Take 1 tablet by mouth 2 (two) times daily. 60 tablet 3   Spacer/Aero-Holding Chambers (OPTICHAMBER DIAMOND) MISC Use with inhaler to ensure medication is delivered throughout lungs 1 each 0   torsemide (DEMADEX) 20 MG tablet Take 1 tablet (20 mg total) by mouth every Monday, Wednesday, and Friday. 36 tablet 3   Vitamin D, Ergocalciferol, (DRISDOL) 1.25 MG (50000 UNIT) CAPS capsule Take 50,000 Units by mouth every 7 (seven) days.     No current facility-administered medications for this visit.     Musculoskeletal: Strength & Muscle Tone: within normal limits Gait & Station: normal Patient leans: N/A  Psychiatric Specialty Exam: Review of Systems  Psychiatric/Behavioral:  Positive for dysphoric mood. Negative for agitation, behavioral problems, confusion, decreased concentration, hallucinations, self-injury, sleep disturbance and suicidal ideas. The patient is nervous/anxious. The patient is not hyperactive.   All other systems reviewed and are negative.   There were no vitals taken for this visit.There is no height or weight on file to calculate BMI.  General Appearance: Fairly Groomed  Eye Contact:  Good  Speech:  Clear and Coherent  Volume:  Normal  Mood:   good  Affect:  NA  Thought Process:  Coherent  Orientation:  Full (Time, Place, and Person)  Thought  Content: Logical   Suicidal Thoughts:  No  Homicidal Thoughts:  No  Memory:  Immediate;   Good  Judgement:  Good  Insight:  Good  Psychomotor Activity:  Normal  Concentration:  Concentration: Good and Attention Span: Good  Recall:  Good  Fund of Knowledge: Good  Language: Good  Akathisia:  No  Handed:  Right  AIMS (if indicated): not done  Assets:  Communication Skills Desire for Improvement  ADL's:  Intact  Cognition: WNL  Sleep:  Good   Screenings: GAD-7    Health and safety inspector from 02/17/2022 in [place]  Total GAD-7 Score 6      PHQ2-9    Flowsheet Row Counselor from 02/17/2022 in [place] [facility] Visit from 01/18/2022 in [place] Management from 12/11/2021 in [place] Office Visit from 12/04/2021 in [place] Office Visit from 11/03/2021 in [place] [facility] Total Score 1 0 2 2 0  PHQ-9 Total Score 4 6 -- 14 --      Flowsheet Row Video Visit from 03/18/2022 in [place] Video Visit from 03/04/2022 in [place] [facility] from 02/26/2022 in [facility] [place] No Risk No Risk No Risk        Assessment and Plan:  Jocelyn Wells is a 67 y.o. year old female with a history of depression, type II DM, hypertension, hyperlipidemia, non ischemic cardiomyopathy,  CKD stage IV, cryptogenic stroke, COPD, sleep apnea, back pain with T12-L1 compression fracture, T7-8 disc herniation with some spinal cord compression with no clinical myelopathy, who presents for follow up appointment for below.   1. MDD (major depressive disorder), recurrent, in partial remission ([place]) 2. PTSD (post-traumatic stress disorder) Acute stressors include:  Other stressors include: health related concern, unemployment, chronic pain, stroke    History:  on Paxil 40 mg since seen by Dr. Toy Care Although she reports occasional down mood and anxiety, this has been manageable since the last visit.  Will continue current dose of Paxil to target depression and PTSD.  She will greatly benefit from CBT; referral was made previously.    # Insomnia Improving. She reports history of snoring, daytime fatigue and insomnia.  Although she was strongly recommended to do a sleep evaluation, she  declined this, referring to the evaluation she had few years ago.  Will continue to monitor.      Plan Continue Paxil 40 mg daily Referral to therapy Next appointment- 6/18 at 10 AM, IP   -on tramadol. Will monitor for serotonin syndrome   The patient demonstrates the following risk factors for suicide: Chronic risk factors for suicide include: psychiatric disorder of depression and chronic pain. Acute risk factors for suicide include: unemployment. Protective factors for this patient include: positive social support, coping skills, and hope for the future. Considering these factors, the overall suicide risk at this point appears to be low. Patient is appropriate for outpatient follow up.   Collaboration of Care: Collaboration of Care: Other reviewed notes in Epic  Patient/Guardian was advised Release of Information must be obtained prior to any record release in order to collaborate their care with an outside provider. Patient/Guardian was advised if they have not already done so to contact the registration department to sign all necessary forms in  order for [place] to release information regarding their care.   Consent: Patient/Guardian gives verbal consent for treatment and assignment of benefits for services provided during this visit. Patient/Guardian expressed understanding and agreed to proceed.    Norman Clay, MD 04/07/2022, 9:02 AM

## 2022-04-06 DIAGNOSIS — D631 Anemia in chronic kidney disease: Secondary | ICD-10-CM | POA: Diagnosis not present

## 2022-04-06 DIAGNOSIS — E1122 Type 2 diabetes mellitus with diabetic chronic kidney disease: Secondary | ICD-10-CM | POA: Diagnosis not present

## 2022-04-06 DIAGNOSIS — I152 Hypertension secondary to endocrine disorders: Secondary | ICD-10-CM | POA: Diagnosis not present

## 2022-04-06 DIAGNOSIS — I5032 Chronic diastolic (congestive) heart failure: Secondary | ICD-10-CM | POA: Diagnosis not present

## 2022-04-06 DIAGNOSIS — N184 Chronic kidney disease, stage 4 (severe): Secondary | ICD-10-CM | POA: Diagnosis not present

## 2022-04-06 DIAGNOSIS — E1159 Type 2 diabetes mellitus with other circulatory complications: Secondary | ICD-10-CM | POA: Diagnosis not present

## 2022-04-07 ENCOUNTER — Telehealth (INDEPENDENT_AMBULATORY_CARE_PROVIDER_SITE_OTHER): Payer: Medicare Other | Admitting: Psychiatry

## 2022-04-07 ENCOUNTER — Encounter: Payer: Self-pay | Admitting: Psychiatry

## 2022-04-07 DIAGNOSIS — F4310 Post-traumatic stress disorder, unspecified: Secondary | ICD-10-CM

## 2022-04-07 DIAGNOSIS — F3341 Major depressive disorder, recurrent, in partial remission: Secondary | ICD-10-CM | POA: Diagnosis not present

## 2022-04-07 MED ORDER — PAROXETINE HCL 40 MG PO TABS: 40.0000 mg | ORAL_TABLET | Freq: Every day | ORAL | 1 refills | Status: DC

## 2022-04-07 NOTE — Patient Instructions (Signed)
Continue Paxil 40 mg daily Next appointment- 6/18 at 10 AM

## 2022-04-09 ENCOUNTER — Encounter: Payer: Self-pay | Admitting: *Deleted

## 2022-04-09 DIAGNOSIS — D631 Anemia in chronic kidney disease: Secondary | ICD-10-CM | POA: Diagnosis not present

## 2022-04-09 DIAGNOSIS — E1159 Type 2 diabetes mellitus with other circulatory complications: Secondary | ICD-10-CM | POA: Diagnosis not present

## 2022-04-09 DIAGNOSIS — E1122 Type 2 diabetes mellitus with diabetic chronic kidney disease: Secondary | ICD-10-CM | POA: Diagnosis not present

## 2022-04-09 DIAGNOSIS — N184 Chronic kidney disease, stage 4 (severe): Secondary | ICD-10-CM | POA: Diagnosis not present

## 2022-04-09 DIAGNOSIS — I152 Hypertension secondary to endocrine disorders: Secondary | ICD-10-CM | POA: Diagnosis not present

## 2022-04-09 DIAGNOSIS — I5032 Chronic diastolic (congestive) heart failure: Secondary | ICD-10-CM | POA: Diagnosis not present

## 2022-04-09 NOTE — Telephone Encounter (Signed)
This encounter was created in error - please disregard.

## 2022-04-13 ENCOUNTER — Ambulatory Visit: Payer: Medicare Other

## 2022-04-13 DIAGNOSIS — E1122 Type 2 diabetes mellitus with diabetic chronic kidney disease: Secondary | ICD-10-CM | POA: Diagnosis not present

## 2022-04-13 DIAGNOSIS — E1129 Type 2 diabetes mellitus with other diabetic kidney complication: Secondary | ICD-10-CM

## 2022-04-13 DIAGNOSIS — I5032 Chronic diastolic (congestive) heart failure: Secondary | ICD-10-CM

## 2022-04-13 DIAGNOSIS — I152 Hypertension secondary to endocrine disorders: Secondary | ICD-10-CM | POA: Diagnosis not present

## 2022-04-13 DIAGNOSIS — D631 Anemia in chronic kidney disease: Secondary | ICD-10-CM | POA: Diagnosis not present

## 2022-04-13 DIAGNOSIS — N184 Chronic kidney disease, stage 4 (severe): Secondary | ICD-10-CM | POA: Diagnosis not present

## 2022-04-13 DIAGNOSIS — E1159 Type 2 diabetes mellitus with other circulatory complications: Secondary | ICD-10-CM | POA: Diagnosis not present

## 2022-04-13 NOTE — Progress Notes (Unsigned)
Care Management & Coordination Services Pharmacy Note  04/13/2022 Name:  Jocelyn Wells MRN:  [id number] DOB:  [birth year]  Summary: Patient presents for follow-up consult.   Patient was started on Jardiance 10 mg, although I am unable to see the notes of this visit in her chart. Given last eGFR <20 and addition of multiple other diuretic agents unclear on the overall benefit and safety of this medication at this time.   Recommendations/Changes made from today's visit:   Follow up plan: CPP follow-up 1 month  Subjective: Jocelyn Wells is an 67 y.o. year old female who is a primary patient of Gwyneth Sprout, FNP.  The care coordination team was consulted for assistance with disease management and care coordination needs.    Engaged with patient by telephone for follow up visit.  Recent office visits: 01/18/22: Patient presented to Tally Joe, Fnp for follow-up.  09/02/2021 Tally Joe FNP (PCP) No Medication Changes noted, Return in about 3 months   Recent consult visits: 03/25/22: Patient presented to Dr. Candiss Norse (Nephrology) for follow-up.  03/04/22: Patient presented to Verl Blalock, LCSW (Behavioral health)  02/11/22: Patient presented to Gerrie Nordmann, NP (Cardiology)  02/10/22: Patient presented to Dr. Candiss Norse (Nephrology) for follow-up.  02/03/22: Patient presented to Darylene Price, [place] (Cardiology)  01/29/22: Patient presented to Dr. Patsey Berthold (Pulmonology)  01/28/22: Patient presented to Dr. Ronalee Belts (Vascular)   Hospital visits: 01/01/22: Patient hospitalized for anemia    Objective:  Lab Results  Component Value Date   CREATININE 3.27 (H) 02/26/2022   BUN 52 (H) 02/26/2022   EGFR 20 (L) 12/04/2021   GFRNONAA 15 (L) 02/26/2022   GFRAA 33 (L) 04/23/2019   NA 135 02/26/2022   K 5.0 02/26/2022   CALCIUM 9.0 02/26/2022   CO2 26 02/26/2022   GLUCOSE 299 (H) 02/26/2022    Lab Results  Component Value Date/Time   HGBA1C 10.2 (H) 11/23/2021 03:53 PM   HGBA1C 7.9 (A)  07/14/2021 03:44 PM   HGBA1C 10.3 09/02/2020 12:00 AM   HGBA1C 10.2 (H) 05/31/2020 05:32 AM   HGBA1C 11.4 03/28/2019 12:00 AM   MICROALBUR 34 05/19/2016 12:00 AM    Last diabetic Eye exam:  Lab Results  Component Value Date/Time   HMDIABEYEEXA Retinopathy (A) 07/15/2020 02:18 PM    Last diabetic Foot exam: No results found for: "HMDIABFOOTEX"   Lab Results  Component Value Date   CHOL 107 12/04/2021   HDL 36 (L) 12/04/2021   LDLCALC 50 12/04/2021   TRIG 112 12/04/2021   CHOLHDL 3.0 12/04/2021       Latest Ref Rng & Units 12/15/2021    3:28 PM 12/07/2021    5:44 PM 12/04/2021    3:42 PM  Hepatic Function  Total Protein 6.5 - 8.1 g/dL 6.4  6.8  6.0   Albumin 3.5 - 5.0 g/dL 3.1  3.2  3.6   AST 15 - 41 U/L '13  12  13   '$ ALT 0 - 44 U/L '10  10  11   '$ Alk Phosphatase 38 - 126 U/L 81  92  106   Total Bilirubin 0.3 - 1.2 mg/dL 0.4  0.5  <0.2     Lab Results  Component Value Date/Time   TSH 2.977 10/06/2021 12:38 PM       Latest Ref Rng & Units 03/31/2022   10:27 AM 02/26/2022    4:25 PM 02/24/2022   10:09 AM  CBC  WBC 4.0 - 10.5 K/uL 10.3  12.1  10.8  Hemoglobin 12.0 - 15.0 g/dL 10.3  12.2  11.9   Hematocrit 36.0 - 46.0 % 35.7  41.9  41.6   Platelets 150 - 400 K/uL 550  613  589     Lab Results  Component Value Date/Time   VITAMINB12 310 12/15/2021 03:28 PM    Clinical ASCVD: Yes  The ASCVD Risk score (Arnett DK, et al., 2019) failed to calculate for the following reasons:   The patient has a prior MI or stroke diagnosis       02/17/2022    2:53 PM 01/18/2022    3:11 PM 12/11/2021    5:19 PM  Depression screen PHQ 2/9  Decreased Interest 1 0 1  Down, Depressed, Hopeless 0 0 1  PHQ - 2 Score 1 0 2  Altered sleeping 1 3   Tired, decreased energy 1 3   Change in appetite 1 0   Feeling bad or failure about yourself  0 0   Trouble concentrating 0 0   Moving slowly or fidgety/restless 0 0   Suicidal thoughts 0 0   PHQ-9 Score 4 6   Difficult doing work/chores  Somewhat difficult Not difficult at all      Social History   Tobacco Use  Smoking Status Some Days   Packs/day: 0.50   Years: 47.00   Total pack years: 23.50   Types: Cigarettes   Start date: 02/08/1974  Smokeless Tobacco Never  Tobacco Comments   Quit 01/18/2022   BP Readings from Last 3 Encounters:  03/31/22 119/65  03/29/22 118/65  03/22/22 (!) 169/76   Pulse Readings from Last 3 Encounters:  03/31/22 72  03/29/22 78  03/22/22 63   Wt Readings from Last 3 Encounters:  03/31/22 227 lb (103 kg)  02/26/22 215 lb (97.5 kg)  02/15/22 218 lb (98.9 kg)   BMI Readings from Last 3 Encounters:  03/31/22 36.64 kg/m  02/26/22 34.70 kg/m  02/15/22 35.19 kg/m    Allergies  Allergen Reactions   Codeine Nausea And Vomiting   Ivp Dye [Iodinated Contrast Media]     Patients states the IV Dye shuts her kidneys down    Medications Reviewed Today     Reviewed by Norman Clay, MD (Psychiatrist) on 04/07/22 at [place] List Status: <None>   Medication Order Taking? Sig Documenting Provider Last Dose Status Informant  albuterol (VENTOLIN HFA) 108 (90 Base) MCG/ACT inhaler [id number] No TAKE 2 PUFFS BY MOUTH EVERY 6 HOURS AS NEEDED FOR WHEEZE OR SHORTNESS OF BREATH  Patient taking differently: Inhale 2 puffs into the lungs every 6 (six) hours as needed for wheezing or shortness of breath.   Gwyneth Sprout, FNP Taking Active Spouse/Significant Other  apixaban (ELIQUIS) 5 MG TABS tablet [id number] No Take 1 tablet (5 mg total) by mouth 2 (two) times daily. Gerrie Nordmann, NP Taking Active   atorvastatin (LIPITOR) 40 MG tablet [id number] No Take 1 tablet (40 mg total) by mouth daily. Gwyneth Sprout, FNP Taking Active Spouse/Significant Other  buPROPion [facility] SR) 150 MG 12 hr tablet [id number] No Take 1 tablet (150 mg total) by mouth 2 (two) times daily. Gwyneth Sprout, FNP Taking Active   carvedilol (COREG) 25 MG tablet [id number] No Take 1 tablet (25 mg total) by mouth 2 (two)  times daily with a meal. Shelly Coss, MD Taking Active   clopidogrel (PLAVIX) 75 MG tablet [id number] No Take 1 tablet (75 mg total) by mouth daily. Gerrie Nordmann, NP Taking Active Spouse/Significant Other  Dulaglutide 3 MG/0.5ML SOPN [id number] No Inject into the skin once a week. [provider] Taking Active Spouse/Significant Other           Med Note Carrolyn Leigh, RAQUEL   Wed Dec 16, 2021  2:47 PM) On Mondays  fluticasone (FLONASE) 50 MCG/ACT nasal spray [id number] No Place 2 sprays into both nostrils daily. Gwyneth Sprout, FNP Taking Active Spouse/Significant Other  Fluticasone-Umeclidin-Vilant (TRELEGY ELLIPTA) 100-62.5-25 MCG/ACT AEPB [id number] No Inhale 1 puff into the lungs daily. Tyler Pita, MD Taking Active   Fluticasone-Umeclidin-Vilant [facility] ELLIPTA) 100-62.5-25 MCG/ACT AEPB [id number] No Inhale 1 puff into the lungs daily.  Patient not taking: Reported on 02/03/2022   Tyler Pita, MD Not Taking Active   FUROSCIX 80 MG/10ML CTKT [id number] No Inject into the skin. [provider] Taking Active   gabapentin (NEURONTIN) 300 MG capsule [id number] No Take 300 mg by mouth 2 (two) times daily. [provider] Taking Active Spouse/Significant Other  glucose blood test strip [id number] No OneTouch Verio strips [provider] Taking Active Spouse/Significant Other  hydrALAZINE (APRESOLINE) 25 MG tablet [id number] No Take 2 tablets (50 mg total) by mouth 3 (three) times daily. Alisa Graff, FNP Taking Active   insulin aspart (NOVOLOG) 100 UNIT/ML injection [id number] No Insulin pump [provider] Taking Active Spouse/Significant Other  Insulin Disposable Pump (OMNIPOD DASH PODS, GEN 4,) MISC [id number] No USE 1 POD EACH EVERY 72    HOURS [provider] Taking Active Spouse/Significant Other  Insulin Human (INSULIN PUMP) SOLN [id number] No Per endocrinoloy Elgergawy, Silver Huguenin, MD Taking Active Spouse/Significant Other   loratadine (CLARITIN) 10 MG tablet [id number] No Take 1 tablet by mouth daily. [provider] Taking Active Spouse/Significant Other           Med Note Inocente Salles, RACHEL A   Fri Mar 21, 2015 10:46 AM)    meclizine (ANTIVERT) 12.5 MG tablet [id number] No Take 1 tablet (12.5 mg total) by mouth 3 (three) times daily as needed for dizziness. Myles Gip, DO Taking Active Spouse/Significant Other  OXYGEN [id number] No Place 2 L into the nose continuous. [provider] Taking Active Self  pantoprazole (PROTONIX) 40 MG tablet [id number] No Take 1 tablet (40 mg total) by mouth 2 (two) times daily. Sharen Hones, MD Taking Active Spouse/Significant Other  PARoxetine (PAXIL) 40 MG tablet [id number]  Take 1 tablet (40 mg total) by mouth daily. Norman Clay, MD  Active   sacubitril-valsartan (ENTRESTO) 24-26 [place] [id number] No Take 1 tablet by mouth 2 (two) times daily. Alisa Graff, FNP Taking Active   Spacer/Aero-Holding Josiah Lobo ([place]) [place] [id number] No Use with inhaler to ensure medication is delivered throughout lungs Gwyneth Sprout, FNP Taking Active Spouse/Significant Other  torsemide (DEMADEX) 20 MG tablet [id number] No Take 1 tablet (20 mg total) by mouth every Monday, Wednesday, and Friday. Gerrie Nordmann, NP Taking Active   Vitamin D, Ergocalciferol, (DRISDOL) 1.25 MG (50000 UNIT) CAPS capsule [id number] No Take 50,000 Units by mouth every 7 (seven) days. [provider] Taking Active   Med List Note Janett Billow, RN 11/03/21 1349): MR 03/03/22 UDS 11/03/21            SDOH:  (Social Determinants of Health) assessments and interventions performed: Yes SDOH Interventions    Flowsheet Row Counselor from 02/17/2022 in confidential department Office Visit from 01/18/2022 in [place] Telephone from 01/05/2022 in [place] Coordination Office Visit from 12/15/2021 in [place]  [place] at  [place] Management from 12/11/2021 in [place] Management from 09/08/2021 in [facility]  SDOH Interventions        Food Insecurity Interventions -- -- Intervention Not Indicated -- Intervention Not Indicated --  Housing Interventions -- -- Intervention Not Indicated -- Intervention Not Indicated --  Transportation Interventions -- -- Intervention Not Indicated Intervention Not Indicated Intervention Not Indicated Intervention Not Indicated  Utilities Interventions -- -- -- -- Intervention Not Indicated --  Depression Interventions/Treatment  Counseling Medication -- -- PHQ2-9 Score <4 Follow-up Not Indicated --  Financial Strain Interventions -- -- -- -- Intervention Not Indicated Intervention Not Indicated  Physical Activity Interventions -- -- -- -- Other (Comments)  [Currently receiving in home therapy twice a week.] --  Stress Interventions -- -- -- -- Other (Comment)  [Reports currently managing well/Declined current need for counseing] --  Social Connections Interventions -- -- -- -- Intervention Not Indicated --       Medication Assistance: Application for Entresto  medication assistance program. in process.  Anticipated assistance start date TBD.  See plan of care for additional detail.  Medication Access: Within the past 30 days, how often has patient missed a dose of medication? None Is a pillbox or other method used to improve adherence? Yes  Factors that may affect medication adherence? no barriers identified Are meds synced by current pharmacy? No  Are meds delivered by current pharmacy? No  Does patient experience delays in picking up medications due to transportation concerns? No   [facility] Services Reviewed: Is patient disadvantaged to use [facility]?: Yes  Current Rx insurance plan: [place] Name and location of [facility]  [id number] [place] [place] [id number]: [phone]: [phone] [facility] [address] [place] [facility] [id number]: [phone]: [phone] [facility] services reviewed with patient today?: No  Patient requests to transfer care to [facility]?: No  Reason patient declined to change pharmacies: Disadvantaged due to insurance/mail order  Compliance/Adherence/Medication fill history: Care Gaps: Tdap Opthalmology exam  Mammogram  Lung Cancer Screen DEXA   Star-Rating Drugs: Atorvastatin 40 mg last [facility]09[facility]. Trulicity 3 mg last [facility]18[place]2021 [place] [facility] [place] [facility] [place]  Assessment/Plan  Heart Failure (Goal: manage symptoms and prevent exacerbations) -Controlled -Last ejection fraction: 60-65%, normal LV function (Date: Jan 2024) -HF type: HF w/recovered EF  -NYHA Class: II (slight limitation of activity) -Current treatment: Carvedilol 25 mg twice daily  Entresto 24-26 mg twice daily  Jardiance 10 mg daily  Hydralazine 25 mg three times daily  Torsemide 20 mg daily -Medications previously tried: HCTZ, Losartan (AKI), Metoprolol, Spironolactone  -Recommended to continue current medication  Hyperlipidemia: (LDL goal < 55) -Controlled -Stenting July 5th  -Current treatment: Atorvastatin 40 mg daily  -Current treatment: Clopidogrel 75 mg daily  -Medications previously tried: NA  -Recommended to continue current medication  Diabetes (A1c goal <8%) -Uncontrolled -Managed by Dr. SGabriel Carina -Current medications: Trulicity 3 mg weekly on Mondays  Novolog via insulin pump  -Medications previously tried: Ozempic (nausea), glyburide/metformin (CKD),   -Current home glucose readings (uses freestyle [place]  -Denies hypoglycemic/hyperglycemic symptoms -Recommended to  continue current medication  COPD (Goal: control symptoms and prevent exacerbations) -Controlled -  Current treatment  Albuterol 2 puffs every 6 hours as needed  Flonase  Loratadine 10 mg daily  -Medications previously tried: NA  -Frequency of rescue inhaler use: <1x in past month.  -Recommended to continue current medication  Depression/Anxiety (Goal: Maintain symptom remission) -Controlled -Current treatment: Paroxetine 40 mg daily  -Medications previously tried/failed: Aripiprazole  -Recommended to continue current medication  Chronic DVTs (Goal: Prevent clots) -Controlled -Current treatment  Eliquis 5 mg twice daily  -Medications previously tried: NA  -Recommended to continue current medication  Chronic Kidney Disease Stage 4  -All medications assessed for renal dosing and appropriateness in chronic kidney disease. -told by Dr. Lanora Manis 4 glasses of water daily.  -Drinks unsweetened tea 4-5 glasses. 2-3 of water.   -Recommended to continue current medication  Junius Argyle, PharmD, [place], [place] Pharmacist Practitioner  [facility] [phone]

## 2022-04-16 ENCOUNTER — Telehealth: Payer: Self-pay | Admitting: Family Medicine

## 2022-04-16 DIAGNOSIS — D631 Anemia in chronic kidney disease: Secondary | ICD-10-CM | POA: Diagnosis not present

## 2022-04-16 DIAGNOSIS — E1159 Type 2 diabetes mellitus with other circulatory complications: Secondary | ICD-10-CM | POA: Diagnosis not present

## 2022-04-16 DIAGNOSIS — E1122 Type 2 diabetes mellitus with diabetic chronic kidney disease: Secondary | ICD-10-CM | POA: Diagnosis not present

## 2022-04-16 DIAGNOSIS — I152 Hypertension secondary to endocrine disorders: Secondary | ICD-10-CM | POA: Diagnosis not present

## 2022-04-16 DIAGNOSIS — I5032 Chronic diastolic (congestive) heart failure: Secondary | ICD-10-CM | POA: Diagnosis not present

## 2022-04-16 DIAGNOSIS — N184 Chronic kidney disease, stage 4 (severe): Secondary | ICD-10-CM | POA: Diagnosis not present

## 2022-04-16 NOTE — Telephone Encounter (Signed)
Please advise 

## 2022-04-16 NOTE — Telephone Encounter (Signed)
LVM--regarding verbal order for [facility] frequency-2 W 4.

## 2022-04-16 NOTE — Telephone Encounter (Signed)
Malachy Mood calling from [place] is calling request orders for [facility] frequency- 2 W 4  CB- [phone] Verbal ok VM

## 2022-04-19 ENCOUNTER — Ambulatory Visit
Admission: RE | Admit: 2022-04-19 | Discharge: 2022-04-19 | Disposition: A | Discharge status: Home / Self Care | Payer: Medicare Other | Attending: Gastroenterology | Admitting: Gastroenterology

## 2022-04-19 ENCOUNTER — Encounter
Admission: RE | Disposition: A | Discharge status: Home / Self Care | Payer: Self-pay | Source: Home / Self Care | Attending: Gastroenterology

## 2022-04-19 DIAGNOSIS — D509 Iron deficiency anemia, unspecified: Secondary | ICD-10-CM | POA: Insufficient documentation

## 2022-04-19 HISTORY — PX: GIVENS CAPSULE STUDY: SHX5432

## 2022-04-19 SURGERY — IMAGING PROCEDURE, GI TRACT, INTRALUMINAL, VIA CAPSULE

## 2022-04-20 ENCOUNTER — Encounter: Payer: Self-pay | Admitting: Gastroenterology

## 2022-04-21 ENCOUNTER — Ambulatory Visit (INDEPENDENT_AMBULATORY_CARE_PROVIDER_SITE_OTHER): Payer: Medicare Other

## 2022-04-21 ENCOUNTER — Ambulatory Visit (INDEPENDENT_AMBULATORY_CARE_PROVIDER_SITE_OTHER): Payer: Medicare Other | Admitting: Nurse Practitioner

## 2022-04-21 ENCOUNTER — Encounter (INDEPENDENT_AMBULATORY_CARE_PROVIDER_SITE_OTHER): Payer: Self-pay | Admitting: Nurse Practitioner

## 2022-04-21 ENCOUNTER — Telehealth: Payer: Self-pay | Admitting: Family

## 2022-04-21 VITALS — BP 146/78 | HR 75 | Resp 18 | Ht 66.0 in | Wt 209.0 lb

## 2022-04-21 DIAGNOSIS — I152 Hypertension secondary to endocrine disorders: Secondary | ICD-10-CM | POA: Diagnosis not present

## 2022-04-21 DIAGNOSIS — F330 Major depressive disorder, recurrent, mild: Secondary | ICD-10-CM | POA: Diagnosis not present

## 2022-04-21 DIAGNOSIS — E1122 Type 2 diabetes mellitus with diabetic chronic kidney disease: Secondary | ICD-10-CM | POA: Diagnosis not present

## 2022-04-21 DIAGNOSIS — I739 Peripheral vascular disease, unspecified: Secondary | ICD-10-CM | POA: Diagnosis not present

## 2022-04-21 DIAGNOSIS — N184 Chronic kidney disease, stage 4 (severe): Secondary | ICD-10-CM | POA: Diagnosis not present

## 2022-04-21 DIAGNOSIS — E1159 Type 2 diabetes mellitus with other circulatory complications: Secondary | ICD-10-CM | POA: Diagnosis not present

## 2022-04-21 DIAGNOSIS — Z6835 Body mass index (BMI) 35.0-35.9, adult: Secondary | ICD-10-CM | POA: Diagnosis not present

## 2022-04-21 DIAGNOSIS — Z9889 Other specified postprocedural states: Secondary | ICD-10-CM | POA: Diagnosis not present

## 2022-04-21 DIAGNOSIS — S81801D Unspecified open wound, right lower leg, subsequent encounter: Secondary | ICD-10-CM | POA: Diagnosis not present

## 2022-04-21 DIAGNOSIS — I70201 Unspecified atherosclerosis of native arteries of extremities, right leg: Secondary | ICD-10-CM | POA: Diagnosis not present

## 2022-04-21 DIAGNOSIS — M810 Age-related osteoporosis without current pathological fracture: Secondary | ICD-10-CM | POA: Diagnosis not present

## 2022-04-21 DIAGNOSIS — J9621 Acute and chronic respiratory failure with hypoxia: Secondary | ICD-10-CM | POA: Diagnosis not present

## 2022-04-21 DIAGNOSIS — I480 Paroxysmal atrial fibrillation: Secondary | ICD-10-CM | POA: Diagnosis not present

## 2022-04-21 DIAGNOSIS — I5032 Chronic diastolic (congestive) heart failure: Secondary | ICD-10-CM | POA: Diagnosis not present

## 2022-04-21 DIAGNOSIS — I7025 Atherosclerosis of native arteries of other extremities with ulceration: Secondary | ICD-10-CM

## 2022-04-21 DIAGNOSIS — I429 Cardiomyopathy, unspecified: Secondary | ICD-10-CM | POA: Diagnosis not present

## 2022-04-21 DIAGNOSIS — M7989 Other specified soft tissue disorders: Secondary | ICD-10-CM

## 2022-04-21 DIAGNOSIS — D509 Iron deficiency anemia, unspecified: Secondary | ICD-10-CM | POA: Diagnosis not present

## 2022-04-21 DIAGNOSIS — E1165 Type 2 diabetes mellitus with hyperglycemia: Secondary | ICD-10-CM | POA: Diagnosis not present

## 2022-04-21 DIAGNOSIS — M1711 Unilateral primary osteoarthritis, right knee: Secondary | ICD-10-CM | POA: Diagnosis not present

## 2022-04-21 DIAGNOSIS — D631 Anemia in chronic kidney disease: Secondary | ICD-10-CM | POA: Diagnosis not present

## 2022-04-21 DIAGNOSIS — Z794 Long term (current) use of insulin: Secondary | ICD-10-CM | POA: Diagnosis not present

## 2022-04-21 DIAGNOSIS — M48061 Spinal stenosis, lumbar region without neurogenic claudication: Secondary | ICD-10-CM | POA: Diagnosis not present

## 2022-04-21 DIAGNOSIS — I10 Essential (primary) hypertension: Secondary | ICD-10-CM

## 2022-04-21 DIAGNOSIS — E1151 Type 2 diabetes mellitus with diabetic peripheral angiopathy without gangrene: Secondary | ICD-10-CM | POA: Diagnosis not present

## 2022-04-21 DIAGNOSIS — E1140 Type 2 diabetes mellitus with diabetic neuropathy, unspecified: Secondary | ICD-10-CM | POA: Diagnosis not present

## 2022-04-21 DIAGNOSIS — J449 Chronic obstructive pulmonary disease, unspecified: Secondary | ICD-10-CM | POA: Diagnosis not present

## 2022-04-21 DIAGNOSIS — F419 Anxiety disorder, unspecified: Secondary | ICD-10-CM | POA: Diagnosis not present

## 2022-04-21 DIAGNOSIS — I452 Bifascicular block: Secondary | ICD-10-CM | POA: Diagnosis not present

## 2022-04-21 NOTE — Telephone Encounter (Signed)
Jocelyn Wells with EMS called in to let us know that they will be contacting the nurse that has been working with patient around April 16th. States that the nurse has been coming 2 X week since discharge and will continue to do so for another 4-5 weeks. He states that her wound has healed on the left leg but wraps are still be placed on bilateral LE due to swelling.He states that due to the nurse coming in, he will not contact until April 16th due to the nurse attending to her in her home. Nothing further needed at this time.

## 2022-04-22 DIAGNOSIS — E1122 Type 2 diabetes mellitus with diabetic chronic kidney disease: Secondary | ICD-10-CM | POA: Diagnosis not present

## 2022-04-22 DIAGNOSIS — N184 Chronic kidney disease, stage 4 (severe): Secondary | ICD-10-CM | POA: Diagnosis not present

## 2022-04-22 DIAGNOSIS — N2581 Secondary hyperparathyroidism of renal origin: Secondary | ICD-10-CM | POA: Diagnosis not present

## 2022-04-22 DIAGNOSIS — R600 Localized edema: Secondary | ICD-10-CM | POA: Diagnosis not present

## 2022-04-22 DIAGNOSIS — I10 Essential (primary) hypertension: Secondary | ICD-10-CM | POA: Diagnosis not present

## 2022-04-22 LAB — PROTEIN / CREATININE RATIO, URINE
Albumin, U: 3.7
Creatinine, Urine: 3.7

## 2022-04-22 LAB — CMP 10231: EGFR (Non-African Amer.): 13

## 2022-04-22 LAB — HEMOGLOBIN A1C: Hemoglobin A1C: 7.8

## 2022-04-23 ENCOUNTER — Other Ambulatory Visit: Payer: Self-pay | Admitting: Family

## 2022-04-23 DIAGNOSIS — E1122 Type 2 diabetes mellitus with diabetic chronic kidney disease: Secondary | ICD-10-CM | POA: Diagnosis not present

## 2022-04-23 DIAGNOSIS — E1159 Type 2 diabetes mellitus with other circulatory complications: Secondary | ICD-10-CM | POA: Diagnosis not present

## 2022-04-23 DIAGNOSIS — H2513 Age-related nuclear cataract, bilateral: Secondary | ICD-10-CM | POA: Diagnosis not present

## 2022-04-23 DIAGNOSIS — H43811 Vitreous degeneration, right eye: Secondary | ICD-10-CM | POA: Diagnosis not present

## 2022-04-23 DIAGNOSIS — I152 Hypertension secondary to endocrine disorders: Secondary | ICD-10-CM | POA: Diagnosis not present

## 2022-04-23 DIAGNOSIS — H31013 Macula scars of posterior pole (postinflammatory) (post-traumatic), bilateral: Secondary | ICD-10-CM | POA: Diagnosis not present

## 2022-04-23 DIAGNOSIS — N184 Chronic kidney disease, stage 4 (severe): Secondary | ICD-10-CM | POA: Diagnosis not present

## 2022-04-23 DIAGNOSIS — I5032 Chronic diastolic (congestive) heart failure: Secondary | ICD-10-CM | POA: Diagnosis not present

## 2022-04-23 DIAGNOSIS — S81801D Unspecified open wound, right lower leg, subsequent encounter: Secondary | ICD-10-CM | POA: Diagnosis not present

## 2022-04-23 DIAGNOSIS — E113513 Type 2 diabetes mellitus with proliferative diabetic retinopathy with macular edema, bilateral: Secondary | ICD-10-CM | POA: Diagnosis not present

## 2022-04-23 MED ORDER — HYDRALAZINE HCL 25 MG PO TABS: 25.0000 mg | ORAL_TABLET | Freq: Two times a day (BID) | ORAL | 3 refills | Status: DC

## 2022-04-23 NOTE — Progress Notes (Signed)
Hydralazine decreased per [facility] MD Bonney Leitz)

## 2022-04-26 ENCOUNTER — Telehealth: Payer: Self-pay | Admitting: Licensed Clinical Social Worker

## 2022-04-26 ENCOUNTER — Other Ambulatory Visit: Payer: Self-pay | Admitting: Internal Medicine

## 2022-04-26 ENCOUNTER — Ambulatory Visit (INDEPENDENT_AMBULATORY_CARE_PROVIDER_SITE_OTHER): Payer: Self-pay | Admitting: Licensed Clinical Social Worker

## 2022-04-26 DIAGNOSIS — E559 Vitamin D deficiency, unspecified: Secondary | ICD-10-CM | POA: Diagnosis not present

## 2022-04-26 DIAGNOSIS — E1159 Type 2 diabetes mellitus with other circulatory complications: Secondary | ICD-10-CM | POA: Diagnosis not present

## 2022-04-26 DIAGNOSIS — E1122 Type 2 diabetes mellitus with diabetic chronic kidney disease: Secondary | ICD-10-CM | POA: Diagnosis not present

## 2022-04-26 DIAGNOSIS — E1129 Type 2 diabetes mellitus with other diabetic kidney complication: Secondary | ICD-10-CM | POA: Diagnosis not present

## 2022-04-26 DIAGNOSIS — I10 Essential (primary) hypertension: Secondary | ICD-10-CM | POA: Diagnosis not present

## 2022-04-26 DIAGNOSIS — E1165 Type 2 diabetes mellitus with hyperglycemia: Secondary | ICD-10-CM | POA: Diagnosis not present

## 2022-04-26 DIAGNOSIS — E113393 Type 2 diabetes mellitus with moderate nonproliferative diabetic retinopathy without macular edema, bilateral: Secondary | ICD-10-CM | POA: Diagnosis not present

## 2022-04-26 DIAGNOSIS — M810 Age-related osteoporosis without current pathological fracture: Secondary | ICD-10-CM | POA: Diagnosis not present

## 2022-04-26 DIAGNOSIS — D638 Anemia in other chronic diseases classified elsewhere: Secondary | ICD-10-CM | POA: Diagnosis not present

## 2022-04-26 DIAGNOSIS — E1121 Type 2 diabetes mellitus with diabetic nephropathy: Secondary | ICD-10-CM | POA: Diagnosis not present

## 2022-04-26 DIAGNOSIS — E1142 Type 2 diabetes mellitus with diabetic polyneuropathy: Secondary | ICD-10-CM | POA: Diagnosis not present

## 2022-04-26 DIAGNOSIS — Z9641 Presence of insulin pump (external) (internal): Secondary | ICD-10-CM | POA: Diagnosis not present

## 2022-04-26 DIAGNOSIS — Z91199 Patient's noncompliance with other medical treatment and regimen due to unspecified reason: Secondary | ICD-10-CM

## 2022-04-26 NOTE — Progress Notes (Signed)
3:04PM LCSW called pt to inquire about attendance at today's appt. Pt reported that she forgot appt and reported desire to reschedule for 3/20 at 2:00 for a telehealth appt. Pt has been rescheduled.

## 2022-04-26 NOTE — Telephone Encounter (Signed)
3:04PM LCSW called pt to inquire about attendance at today's appt. Pt reported that she forgot appt and reported desire to reschedule for 3/20 at 2:00 for a telehealth appt. Pt has been rescheduled.

## 2022-04-27 DIAGNOSIS — S81801D Unspecified open wound, right lower leg, subsequent encounter: Secondary | ICD-10-CM | POA: Diagnosis not present

## 2022-04-27 DIAGNOSIS — I152 Hypertension secondary to endocrine disorders: Secondary | ICD-10-CM | POA: Diagnosis not present

## 2022-04-27 DIAGNOSIS — I5032 Chronic diastolic (congestive) heart failure: Secondary | ICD-10-CM | POA: Diagnosis not present

## 2022-04-27 DIAGNOSIS — E1159 Type 2 diabetes mellitus with other circulatory complications: Secondary | ICD-10-CM | POA: Diagnosis not present

## 2022-04-27 DIAGNOSIS — E1122 Type 2 diabetes mellitus with diabetic chronic kidney disease: Secondary | ICD-10-CM | POA: Diagnosis not present

## 2022-04-27 DIAGNOSIS — N184 Chronic kidney disease, stage 4 (severe): Secondary | ICD-10-CM | POA: Diagnosis not present

## 2022-04-28 ENCOUNTER — Telehealth (INDEPENDENT_AMBULATORY_CARE_PROVIDER_SITE_OTHER): Payer: Medicare Other | Admitting: Licensed Clinical Social Worker

## 2022-04-28 DIAGNOSIS — F3341 Major depressive disorder, recurrent, in partial remission: Secondary | ICD-10-CM

## 2022-04-28 DIAGNOSIS — F4310 Post-traumatic stress disorder, unspecified: Secondary | ICD-10-CM

## 2022-04-30 ENCOUNTER — Telehealth: Payer: Self-pay | Admitting: Family Medicine

## 2022-04-30 DIAGNOSIS — I5032 Chronic diastolic (congestive) heart failure: Secondary | ICD-10-CM | POA: Diagnosis not present

## 2022-04-30 DIAGNOSIS — E1159 Type 2 diabetes mellitus with other circulatory complications: Secondary | ICD-10-CM | POA: Diagnosis not present

## 2022-04-30 DIAGNOSIS — S81801D Unspecified open wound, right lower leg, subsequent encounter: Secondary | ICD-10-CM | POA: Diagnosis not present

## 2022-04-30 DIAGNOSIS — I152 Hypertension secondary to endocrine disorders: Secondary | ICD-10-CM | POA: Diagnosis not present

## 2022-04-30 DIAGNOSIS — E1122 Type 2 diabetes mellitus with diabetic chronic kidney disease: Secondary | ICD-10-CM | POA: Diagnosis not present

## 2022-04-30 DIAGNOSIS — N184 Chronic kidney disease, stage 4 (severe): Secondary | ICD-10-CM | POA: Diagnosis not present

## 2022-04-30 NOTE — Telephone Encounter (Signed)
[name] with [facility] is calling regarding [place]. Jonelle Sidle is wanting confirmation from PCP to see if pt has a wound to the right leg.  Please call [name] back [phone] opt 2

## 2022-05-03 ENCOUNTER — Encounter (INDEPENDENT_AMBULATORY_CARE_PROVIDER_SITE_OTHER): Payer: Self-pay | Admitting: Vascular Surgery

## 2022-05-03 ENCOUNTER — Ambulatory Visit (INDEPENDENT_AMBULATORY_CARE_PROVIDER_SITE_OTHER): Payer: Medicare Other | Admitting: Vascular Surgery

## 2022-05-03 VITALS — BP 107/66 | HR 79 | Resp 18 | Ht 66.0 in | Wt 209.0 lb

## 2022-05-03 DIAGNOSIS — J449 Chronic obstructive pulmonary disease, unspecified: Secondary | ICD-10-CM | POA: Diagnosis not present

## 2022-05-03 DIAGNOSIS — I70213 Atherosclerosis of native arteries of extremities with intermittent claudication, bilateral legs: Secondary | ICD-10-CM | POA: Diagnosis not present

## 2022-05-03 DIAGNOSIS — E1129 Type 2 diabetes mellitus with other diabetic kidney complication: Secondary | ICD-10-CM

## 2022-05-03 DIAGNOSIS — N184 Chronic kidney disease, stage 4 (severe): Secondary | ICD-10-CM | POA: Diagnosis not present

## 2022-05-03 DIAGNOSIS — I10 Essential (primary) hypertension: Secondary | ICD-10-CM | POA: Diagnosis not present

## 2022-05-03 DIAGNOSIS — I70219 Atherosclerosis of native arteries of extremities with intermittent claudication, unspecified extremity: Secondary | ICD-10-CM | POA: Insufficient documentation

## 2022-05-03 LAB — VAS US ABI WITH/WO TBI
Left ABI: 1.19
Right ABI: 0.75

## 2022-05-03 NOTE — Telephone Encounter (Signed)
Advised 

## 2022-05-03 NOTE — Progress Notes (Signed)
MRN : [id number]  Jocelyn Wells is a 67 y.o. (08/07/1955) female who presents with chief complaint of check access.  History of Present Illness:   The patient is seen today for evaluation for dialysis access. The patient has chronic renal insufficiency stage IV secondary.  The patient's most recent creatinine clearance is 13 (obtained 04/22/2022). The patient volume status has not yet become an issue. Patient's blood pressures been relatively well controlled. There are minimal uremic symptoms which appear to be relatively well tolerated at this time.  In the past her post angio course was complicated by acute renal failure ans she did require dialysis.  She subsequently has returned to her baseline stage 4 renal dysfunction as noted by her renal function studies dated December 07, 2021.  The patient is also followed for ASO of the lower extremities.   There have been no interval changes in lower extremity symptoms. No interval shortening of the patient's claudication distance or development of rest pain symptoms. No new ulcers or wounds have occurred since the last visit.  Procedure:  Percutaneous transluminal angioplasty and stent placement left superficial       femoral artery and popliteal to 5 mm proximally and 4 mm distally. 2.    Percutaneous transluminal angioplasty of the left common iliac artery to 9 mm.   3.    Percutaneous transluminal angioplasty of the left peroneal artery to 3 mm.   4.    Mechanical thrombectomy left tibioperoneal trunk and peroneal.    The patient is also followed for DVT.  DVT was identified at [facility] by Duplex ultrasound in June 2020.  The initial symptoms were pain and swelling in the lower extremity.  She was initiated on anticoagulation at that time    No documented history of amaurosis fugax or recent TIA symptoms. There are no recent neurological changes noted. No documented history of DVT, PE or superficial  thrombophlebitis. The patient denies recent episodes of angina or shortness of breath.    ABI's today Rt=0.74  (monophasic) and Lt=1.11 (triphasic)  (previous ABI's Rt=0.79 and Lt=0.96)   Previous duplex US of the upper extremities for vein mapping shows an excellent cephalic vein at the antecubital fossa measuring 5 mm in diameter.   Current Meds  Medication Sig   albuterol (VENTOLIN HFA) 108 (90 Base) MCG/ACT inhaler TAKE 2 PUFFS BY MOUTH EVERY 6 HOURS AS NEEDED FOR WHEEZE OR SHORTNESS OF BREATH (Patient taking differently: Inhale 2 puffs into the lungs every 6 (six) hours as needed for wheezing or shortness of breath.)   apixaban (ELIQUIS) 5 MG TABS tablet Take 1 tablet (5 mg total) by mouth 2 (two) times daily.   atorvastatin (LIPITOR) 40 MG tablet Take 1 tablet (40 mg total) by mouth daily.   buPROPion (WELLBUTRIN SR) 150 MG 12 hr tablet Take 1 tablet (150 mg total) by mouth 2 (two) times daily.   calcitRIOL (ROCALTROL) 0.25 MCG capsule Take 0.25 mcg by mouth daily.   carvedilol (COREG) 25 MG tablet Take 1 tablet (25 mg total) by mouth 2 (two) times daily with a meal.   clopidogrel (PLAVIX) 75 MG tablet Take 1 tablet (75 mg total) by mouth daily.   Dulaglutide 3 MG/0.5ML SOPN Inject into the skin once a week.   fluticasone (FLONASE) 50 MCG/ACT nasal spray Place 2 sprays into both nostrils daily.  Fluticasone-Umeclidin-Vilant (TRELEGY ELLIPTA) 100-62.5-25 MCG/ACT AEPB Inhale 1 puff into the lungs daily.   Fluticasone-Umeclidin-Vilant (TRELEGY ELLIPTA) 100-62.5-25 MCG/ACT AEPB Inhale 1 puff into the lungs daily.   gabapentin (NEURONTIN) 300 MG capsule Take 300 mg by mouth 2 (two) times daily.   glucose blood test strip OneTouch Verio strips   hydrALAZINE (APRESOLINE) 25 MG tablet Take 1 tablet (25 mg total) by mouth 2 (two) times daily.   hydrochlorothiazide (HYDRODIURIL) 25 MG tablet Take 1 tablet by mouth daily.   insulin aspart (NOVOLOG) 100 UNIT/ML injection Insulin pump   Insulin  Disposable Pump (OMNIPOD DASH PODS, GEN 4,) MISC USE 1 POD EACH EVERY 72    HOURS   Insulin Human (INSULIN PUMP) SOLN Per endocrinoloy   JARDIANCE 10 MG TABS tablet Take 10 mg by mouth daily.   loratadine (CLARITIN) 10 MG tablet Take 1 tablet by mouth daily.   meclizine (ANTIVERT) 12.5 MG tablet Take 1 tablet (12.5 mg total) by mouth 3 (three) times daily as needed for dizziness.   OXYGEN Place 2 L into the nose continuous.   pantoprazole (PROTONIX) 40 MG tablet Take 1 tablet (40 mg total) by mouth 2 (two) times daily.   PARoxetine (PAXIL) 40 MG tablet Take 1 tablet (40 mg total) by mouth daily.   sacubitril-valsartan (ENTRESTO) 24-26 MG Take 1 tablet by mouth 2 (two) times daily.   Spacer/Aero-Holding Chambers [facility] DIAMOND) MISC Use with inhaler to ensure medication is delivered throughout lungs   torsemide (DEMADEX) 20 MG tablet TAKE 3 TABLETS BY MOUTH EVERY DAY   Vitamin D, Ergocalciferol, (DRISDOL) 1.25 MG (50000 UNIT) CAPS capsule Take 50,000 Units by mouth every 7 (seven) days.    Past Medical History:  Diagnosis Date   Anemia    Anxiety    Cardiomyopathy [facility])    new to her Jan 2017   CHF (congestive heart failure) (HCC)    Chronic kidney disease    COPD (chronic obstructive pulmonary disease) (HCC)    Depression    Diabetes mellitus, type II ([place])    HTN (hypertension)    Leukocytosis    Obesity    Osteoporosis    PONV (postoperative nausea and vomiting)    Stroke [facility])    Jan 2017    Past Surgical History:  Procedure Laterality Date   ABDOMINAL HYSTERECTOMY     ANKLE FRACTURE SURGERY Left 02/09/2000   BILATERAL SALPINGOOPHORECTOMY  02/08/1998   CARDIAC CATHETERIZATION N/A 03/25/2015   Procedure: Right/Left Heart Cath and Coronary Angiography;  Surgeon: Belva Crome, MD;  Location: [facility];  Service: Cardiovascular;  Laterality: N/A;   CESAREAN SECTION  02/09/1976   COLONOSCOPY WITH PROPOFOL N/A 09/22/2016   Procedure: COLONOSCOPY WITH PROPOFOL;   Surgeon: Manya Silvas, MD;  Location: [facility] ENDOSCOPY;  Service: Endoscopy;  Laterality: N/A;   COLONOSCOPY WITH PROPOFOL N/A 02/15/2022   Procedure: COLONOSCOPY WITH PROPOFOL;  Surgeon: Lin Landsman, MD;  Location: [facility] ENDOSCOPY;  Service: Gastroenterology;  Laterality: N/A;   EP IMPLANTABLE DEVICE N/A 08/05/2015   Procedure: Loop Recorder Insertion;  Surgeon: Deboraha Sprang, MD;  Location: [facility];  Service: Cardiovascular;  Laterality: N/A;   ESOPHAGOGASTRODUODENOSCOPY N/A 02/15/2022   Procedure: ESOPHAGOGASTRODUODENOSCOPY (EGD);  Surgeon: Lin Landsman, MD;  Location: [facility] ENDOSCOPY;  Service: Gastroenterology;  Laterality: N/A;   ESOPHAGOGASTRODUODENOSCOPY (EGD) WITH PROPOFOL N/A 09/22/2016   Procedure: ESOPHAGOGASTRODUODENOSCOPY (EGD) WITH PROPOFOL;  Surgeon: Manya Silvas, MD;  Location: [facility] ENDOSCOPY;  Service: Endoscopy;  Laterality: N/A;  ESOPHAGOGASTRODUODENOSCOPY (EGD) WITH PROPOFOL N/A 12/08/2016   Procedure: ESOPHAGOGASTRODUODENOSCOPY (EGD) WITH PROPOFOL;  Surgeon: Manya Silvas, MD;  Location: [facility] ENDOSCOPY;  Service: Endoscopy;  Laterality: N/A;   FRACTURE SURGERY     GIVENS CAPSULE STUDY N/A 04/19/2022   Procedure: GIVENS CAPSULE STUDY;  Surgeon: Lin Landsman, MD;  Location: [facility] ENDOSCOPY;  Service: Gastroenterology;  Laterality: N/A;   KNEE ARTHROSCOPY Left 02/09/2003   LOWER EXTREMITY ANGIOGRAPHY Left 08/12/2021   Procedure: Lower Extremity Angiography;  Surgeon: Katha Cabal, MD;  Location: [place] [facility];  Service: Cardiovascular;  Laterality: Left;   TEE WITHOUT CARDIOVERSION N/A 01/31/2015   Procedure: TRANSESOPHAGEAL ECHOCARDIOGRAM (TEE);  Surgeon: Lelon Perla, MD;  Location: [facility] ENDOSCOPY;  Service: Cardiovascular;  Laterality: N/A;   TEMPORARY DIALYSIS CATHETER N/A 08/14/2021   Procedure: TEMPORARY DIALYSIS CATHETER;  Surgeon: Katha Cabal, MD;  Location: [place] [facility];  Service: Cardiovascular;   Laterality: N/A;   TUBAL LIGATION  02/09/1976   ULNAR NERVE TRANSPOSITION  02/08/2006    Social History Social History   Tobacco Use   Smoking status: Some Days    Packs/day: 0.50    Years: 47.00    Additional pack years: 0.00    Total pack years: 23.50    Types: Cigarettes    Start date: 02/08/1974   Smokeless tobacco: Never   Tobacco comments:    Quit 01/18/2022  Vaping Use   Vaping Use: Never used  Substance Use Topics   Alcohol use: No    Alcohol/week: 0.0 standard drinks of alcohol   Drug use: No    Family History Family History  Problem Relation Age of Onset   Heart disease Mother        died from CHF   Asthma Mother    Diabetes Mother    Heart disease Father    Aneurysm Father    COPD Brother    Diabetes Brother    Alcohol abuse Paternal Aunt    Cancer Maternal Grandmother        unknownorigin   Anemia Neg Hx    Arrhythmia Neg Hx    Clotting disorder Neg Hx    Fainting Neg Hx    Heart attack Neg Hx    Heart failure Neg Hx    Hyperlipidemia Neg Hx    Hypertension Neg Hx     Allergies  Allergen Reactions   Codeine Nausea And Vomiting   Ivp Dye [Iodinated Contrast Media]     Patients states the IV Dye shuts her kidneys down     REVIEW OF SYSTEMS (Negative unless checked)  Constitutional: [] Weight loss  [] Fever  [] Chills Cardiac: [] Chest pain   [] Chest pressure   [] Palpitations   [] Shortness of breath when laying flat   [] Shortness of breath with exertion. Vascular:  [] Pain in legs with walking   [] Pain in legs at rest  [] History of DVT   [] Phlebitis   [] Swelling in legs   [] Varicose veins   [] Non-healing ulcers Pulmonary:   [] Uses home oxygen   [] Productive cough   [] Hemoptysis   [] Wheeze  [] COPD   [] Asthma Neurologic:  [] Dizziness   [] Seizures   [] History of stroke   [] History of TIA  [] Aphasia   [] Vissual changes   [] Weakness or numbness in arm   [] Weakness or numbness in leg Musculoskeletal:   [] Joint swelling   [] Joint pain   [] Low back  pain Hematologic:  [] Easy bruising  [] Easy bleeding   [] Hypercoagulable state   [] Anemic Gastrointestinal:  []   Diarrhea   [] Vomiting  [] Gastroesophageal reflux/heartburn   [] Difficulty swallowing. Genitourinary:  [x] Chronic kidney disease   [] Difficult urination  [] Frequent urination   [] Blood in urine Skin:  [] Rashes   [] Ulcers  Psychological:  [] History of anxiety   []  History of major depression.  Physical Examination  Vitals:   05/03/22 1335  BP: 107/66  Pulse: 79  Resp: 18  Weight: 209 lb (94.8 kg)  Height: 5\' 6"  (1.676 m)   Body mass index is 33.73 kg/m. Gen: WD/WN, NAD Head: [place]/AT, No temporalis wasting.  Ear/Nose/Throat: Hearing grossly intact, nares w/o erythema or drainage Eyes: PER, EOMI, sclera nonicteric.  Neck: Supple, no gross masses or lesions.  No JVD.  Pulmonary:  Good air movement, no audible wheezing, no use of accessory muscles.  Cardiac: RRR, precordium non-hyperdynamic. Vascular:    Vessel Right Left  Radial Palpable Palpable  Brachial Palpable Palpable  Gastrointestinal: soft, non-distended. No guarding/no peritoneal signs.  Musculoskeletal: M/S 5/5 throughout.  No deformity.  Neurologic: CN 2-12 intact. Pain and light touch intact in extremities.  Symmetrical.  Speech is fluent. Motor exam as listed above. Psychiatric: Judgment intact, Mood & affect appropriate for pt's clinical situation. Dermatologic: No rashes or ulcers noted.  No changes consistent with cellulitis.   CBC Lab Results  Component Value Date   WBC 10.3 03/31/2022   HGB 10.3 (L) 03/31/2022   HCT 35.7 (L) 03/31/2022   MCV 88.6 03/31/2022   PLT 550 (H) 03/31/2022    BMET    Component Value Date/Time   NA 135 02/26/2022 1625   NA 140 12/04/2021 1542   K 5.0 02/26/2022 1625   CL 102 02/26/2022 1625   CO2 26 02/26/2022 1625   GLUCOSE 299 (H) 02/26/2022 1625   BUN 52 (H) 02/26/2022 1625   BUN 38 (H) 12/04/2021 1542   CREATININE 3.27 (H) 02/26/2022 1625   CREATININE 1.37 (H)  03/21/2015 1451   CALCIUM 9.0 02/26/2022 1625   GFRNONAA 15 (L) 02/26/2022 1625   GFRAA 33 (L) 04/23/2019 1446   CrCl cannot be calculated (Patient's most recent lab result is older than the maximum 21 days allowed.).  COAG Lab Results  Component Value Date   INR 1.5 (H) 08/14/2021   INR 0.93 10/05/2017   INR 1.05 03/21/2015    Radiology No results found.   Assessment/Plan 1. CKD (chronic kidney disease), stage IV (HCC) Recommend:  At this time the patient does not have appropriate extremity access for dialysis  Patient should have a left brachial cephalic created.  The risks, benefits and alternative therapies were reviewed in detail with the patient.  All questions were answered.  The patient agrees to proceed with surgery.   The patient will follow up with me in the office after the surgery.  2. Atherosclerosis of native artery of both lower extremities with intermittent claudication (HCC)  Recommend:  The patient has evidence of atherosclerosis of the lower extremities with claudication.  The patient does not voice lifestyle limiting changes at this point in time.  Noninvasive studies do not suggest clinically significant change.  No invasive studies, angiography or surgery at this time The patient should continue walking and begin a more formal exercise program.  The patient should continue antiplatelet therapy and aggressive treatment of the lipid abnormalities  No changes in the patient's medications at this time  Continued surveillance is indicated as atherosclerosis is likely to progress with time.    The patient will continue follow up with noninvasive studies as ordered.  3. Essential hypertension Continue antihypertensive medications as already ordered, these medications have been reviewed and there are no changes at this time.  4. Chronic obstructive pulmonary disease, unspecified COPD type ([place]) Continue pulmonary medications and aerosols as already  ordered, these medications have been reviewed and there are no changes at this time.   5. Type 2 diabetes mellitus with other diabetic kidney complication (HCC) Continue hypoglycemic medications as already ordered, these medications have been reviewed and there are no changes at this time.  Hgb A1C to be monitored as already arranged by primary service    [name], MD  05/03/2022 2:19 PM

## 2022-05-04 ENCOUNTER — Encounter (INDEPENDENT_AMBULATORY_CARE_PROVIDER_SITE_OTHER): Payer: Self-pay | Admitting: Nurse Practitioner

## 2022-05-04 DIAGNOSIS — I152 Hypertension secondary to endocrine disorders: Secondary | ICD-10-CM | POA: Diagnosis not present

## 2022-05-04 DIAGNOSIS — E1122 Type 2 diabetes mellitus with diabetic chronic kidney disease: Secondary | ICD-10-CM | POA: Diagnosis not present

## 2022-05-04 DIAGNOSIS — N184 Chronic kidney disease, stage 4 (severe): Secondary | ICD-10-CM | POA: Diagnosis not present

## 2022-05-04 DIAGNOSIS — E1159 Type 2 diabetes mellitus with other circulatory complications: Secondary | ICD-10-CM | POA: Diagnosis not present

## 2022-05-04 DIAGNOSIS — S81801D Unspecified open wound, right lower leg, subsequent encounter: Secondary | ICD-10-CM | POA: Diagnosis not present

## 2022-05-04 DIAGNOSIS — I5032 Chronic diastolic (congestive) heart failure: Secondary | ICD-10-CM | POA: Diagnosis not present

## 2022-05-04 NOTE — Progress Notes (Signed)
Subjective:    Patient ID: Jocelyn Wells, female    DOB: [birth year], 67 y.o.   MRN: [id number] Chief Complaint  Patient presents with   Follow-up    f/u in 2 months with abi/lea    Jocelyn Wells is a 67 year old female that returns today for evaluation related to pain and a left shin wound.  She notes that the wound initially began as a blister.  The patient was apprehensive to undergo any repeated revascularization due to having acute kidney injury with the most recent episode revascularization.  She notes that the wound has actually healed without any significant issue.  She denies any further open wounds or ulcerations or development of rest pain.  Today the patient has a right ABI of 0.75 with a left of 1.19.  The patient also underwent a left lower extremity arterial duplex which shows primarily biphasic and triphasic waveforms with the exception of monophasic waveforms in the peroneal artery.    Review of Systems  Cardiovascular:  Positive for leg swelling.  Neurological:  Positive for weakness.  All other systems reviewed and are negative.      Objective:   Physical Exam Vitals reviewed.  HENT:     Head: Normocephalic.  Cardiovascular:     Rate and Rhythm: Normal rate.  Pulmonary:     Effort: Pulmonary effort is normal.  Musculoskeletal:     Right lower leg: Edema present.     Left lower leg: Edema present.  Skin:    General: Skin is warm and dry.  Neurological:     Mental Status: She is alert and oriented to person, place, and time.     Motor: Weakness present.  Psychiatric:        Mood and Affect: Mood normal.        Behavior: Behavior normal.        Thought Content: Thought content normal.        Judgment: Judgment normal.     BP (!) 146/78 (BP Location: Left Arm)   Pulse 75   Resp 18   Ht 5\' 6"  (1.676 m)   Wt 209 lb (94.8 kg)   BMI 33.73 kg/m   Past Medical History:  Diagnosis Date   Anemia    Anxiety    Cardiomyopathy ([place])    new to her Jan  2017   CHF (congestive heart failure) (HCC)    Chronic kidney disease    COPD (chronic obstructive pulmonary disease) ([place])    Depression    Diabetes mellitus, type II ([place])    HTN (hypertension)    Leukocytosis    Obesity    Osteoporosis    PONV (postoperative nausea and vomiting)    Stroke [facility])    Jan 2017    Social History   Socioeconomic History   Marital status: Married    Spouse name: Not on file   Number of children: 2   Years of education: Not on file   Highest education level: Bachelor's degree (e.g., BA, AB, BS)  Occupational History   Occupation: disabled    Comment: retired  Tobacco Use   Smoking status: Some Days    Packs/day: 0.50    Years: 47.00    Additional pack years: 0.00    Total pack years: 23.50    Types: Cigarettes    Start date: 02/08/1974   Smokeless tobacco: Never   Tobacco comments:    Quit 01/18/2022  Vaping Use   Vaping Use: Never used  Substance  and Sexual Activity   Alcohol use: No    Alcohol/week: 0.0 standard drinks of alcohol   Drug use: No   Sexual activity: Yes    Birth control/protection: None  Other Topics Concern   Not on file  Social History Narrative   Lives at home with stepson and husband, dogs and cats   Caffeine  Drinks sweet tea.   Right handed.    Social Determinants of Health   Financial Resource Strain: Low Risk  (12/11/2021)   Overall Financial Resource Strain (CARDIA)    Difficulty of Paying Living Expenses: Not very hard  Food Insecurity: No Food Insecurity (01/05/2022)   Hunger Vital Sign    Worried About Running Out of Food in the Last Year: Never true    Ran Out of Food in the Last Year: Never true  Transportation Needs: No Transportation Needs (01/05/2022)   PRAPARE - Hydrologist (Medical): No    Lack of Transportation (Non-Medical): No  Physical Activity: Inactive (12/11/2021)   Exercise Vital Sign    Days of Exercise per Week: 0 days    Minutes of Exercise per  Session: 0 min  Stress: Stress Concern Present (12/11/2021)   [place]    Feeling of Stress : To some extent  Social Connections: Moderately Isolated (12/11/2021)   Social Connection and Isolation Panel [NHANES]    Frequency of Communication with Friends and Family: More than three times a week    Frequency of Social Gatherings with Friends and Family: Once a week    Attends Religious Services: Never    Marine scientist or Organizations: No    Attends Archivist Meetings: Never    Marital Status: Married  Human resources officer Violence: Not At Risk (01/02/2022)   Humiliation, Afraid, Rape, and Kick questionnaire    Fear of Current or Ex-Partner: No    Emotionally Abused: No    Physically Abused: No    Sexually Abused: No    Past Surgical History:  Procedure Laterality Date   ABDOMINAL HYSTERECTOMY     ANKLE FRACTURE SURGERY Left 02/09/2000   BILATERAL SALPINGOOPHORECTOMY  02/08/1998   CARDIAC CATHETERIZATION N/A 03/25/2015   Procedure: Right/Left Heart Cath and Coronary Angiography;  Surgeon: Belva Crome, MD;  Location: [place] [facility];  Service: Cardiovascular;  Laterality: N/A;   CESAREAN SECTION  02/09/1976   COLONOSCOPY WITH PROPOFOL N/A 09/22/2016   Procedure: COLONOSCOPY WITH PROPOFOL;  Surgeon: Manya Silvas, MD;  Location[facility] ENDOSCOPY;  Service: Endoscopy;  Laterality: N/A;   COLONOSCOPY WITH PROPOFOL N/A 02/15/2022   Procedure: COLONOSCOPY WITH PROPOFOL;  Surgeon: Lin Landsman, MD;  Location: [facility] ENDOSCOPY;  Service: Gastroenterology;  Laterality: N/A;   EP IMPLANTABLE DEVICE N/A 08/05/2015   Procedure: Loop Recorder Insertion;  Surgeon: Deboraha Sprang, MD;  Location: [place] [facility];  Service: Cardiovascular;  Laterality: N/A;   ESOPHAGOGASTRODUODENOSCOPY N/A 02/15/2022   Procedure: ESOPHAGOGASTRODUODENOSCOPY (EGD);  Surgeon: Lin Landsman, MD;  Location: [facility]  ENDOSCOPY;  Service: Gastroenterology;  Laterality: N/A;   ESOPHAGOGASTRODUODENOSCOPY (EGD) WITH PROPOFOL N/A 09/22/2016   Procedure: ESOPHAGOGASTRODUODENOSCOPY (EGD) WITH PROPOFOL;  Surgeon: Manya Silvas, MD;  Location: [facility] ENDOSCOPY;  Service: Endoscopy;  Laterality: N/A;   ESOPHAGOGASTRODUODENOSCOPY (EGD) WITH PROPOFOL N/A 12/08/2016   Procedure: ESOPHAGOGASTRODUODENOSCOPY (EGD) WITH PROPOFOL;  Surgeon: Manya Silvas, MD;  Location: [facility] ENDOSCOPY;  Service: Endoscopy;  Laterality: N/A;   FRACTURE SURGERY  GIVENS CAPSULE STUDY N/A 04/19/2022   Procedure: GIVENS CAPSULE STUDY;  Surgeon: Lin Landsman, MD;  Location: [facility] ENDOSCOPY;  Service: Gastroenterology;  Laterality: N/A;   KNEE ARTHROSCOPY Left 02/09/2003   LOWER EXTREMITY ANGIOGRAPHY Left 08/12/2021   Procedure: Lower Extremity Angiography;  Surgeon: Katha Cabal, MD;  Location: [place] [facility];  Service: Cardiovascular;  Laterality: Left;   TEE WITHOUT CARDIOVERSION N/A 01/31/2015   Procedure: TRANSESOPHAGEAL ECHOCARDIOGRAM (TEE);  Surgeon: Lelon Perla, MD;  Location: [facility] ENDOSCOPY;  Service: Cardiovascular;  Laterality: N/A;   TEMPORARY DIALYSIS CATHETER N/A 08/14/2021   Procedure: TEMPORARY DIALYSIS CATHETER;  Surgeon: Katha Cabal, MD;  Location: [place] [facility];  Service: Cardiovascular;  Laterality: N/A;   TUBAL LIGATION  02/09/1976   ULNAR NERVE TRANSPOSITION  02/08/2006    Family History  Problem Relation Age of Onset   Heart disease Mother        died from CHF   Asthma Mother    Diabetes Mother    Heart disease Father    Aneurysm Father    COPD Brother    Diabetes Brother    Alcohol abuse Paternal Aunt    Cancer Maternal Grandmother        unknownorigin   Anemia Neg Hx    Arrhythmia Neg Hx    Clotting disorder Neg Hx    Fainting Neg Hx    Heart attack Neg Hx    Heart failure Neg Hx    Hyperlipidemia Neg Hx    Hypertension Neg Hx     Allergies  Allergen Reactions    Codeine Nausea And Vomiting   Ivp Dye [Iodinated Contrast Media]     Patients states the IV Dye shuts her kidneys down       Latest Ref Rng & Units 03/31/2022   10:27 AM 02/26/2022    4:25 PM 02/24/2022   10:09 AM  CBC  WBC 4.0 - 10.5 K/uL 10.3  12.1  10.8   Hemoglobin 12.0 - 15.0 g/dL 10.3  12.2  11.9   Hematocrit 36.0 - 46.0 % 35.7  41.9  41.6   Platelets 150 - 400 K/uL 550  613  589       CMP     Component Value Date/Time   NA 135 02/26/2022 1625   NA 140 12/04/2021 1542   K 5.0 02/26/2022 1625   CL 102 02/26/2022 1625   CO2 26 02/26/2022 1625   GLUCOSE 299 (H) 02/26/2022 1625   BUN 52 (H) 02/26/2022 1625   BUN 38 (H) 12/04/2021 1542   CREATININE 3.27 (H) 02/26/2022 1625   CREATININE 1.37 (H) 03/21/2015 1451   CALCIUM 9.0 02/26/2022 1625   PROT 6.4 (L) 12/15/2021 1528   PROT 6.0 12/04/2021 1542   ALBUMIN 3.1 (L) 12/15/2021 1528   ALBUMIN 3.6 (L) 12/04/2021 1542   AST 13 (L) 12/15/2021 1528   ALT 10 12/15/2021 1528   ALKPHOS 81 12/15/2021 1528   BILITOT 0.4 12/15/2021 1528   BILITOT <0.2 12/04/2021 1542   GFRNONAA 15 (L) 02/26/2022 1625   GFRAA 33 (L) 04/23/2019 1446     VAS [place] ABI WITH/WO TBI  Result Date: 05/03/2022  LOWER EXTREMITY DOPPLER STUDY Patient Name:  Jocelyn Wells  Date of Exam:   04/21/2022 Medical Rec #: [id number]       Accession #:    [id number] Date of Birth: 1955/03/28       Patient Gender: F Patient Age:   25 years Exam Location:  [facility] Procedure:      VAS [place] ABI WITH/WO TBI Referring Phys: [name] --------------------------------------------------------------------------------  Indications: Rest pain, and ulceration. High Risk Factors: Hypertension, hyperlipidemia.  Vascular Interventions: 08/14/2021: Insertion of Temporary dialysis catheter                         Right Femoral Approach. Comparison Study: 01/14/2022 Performing Technologist: Almira Coaster RVS  Examination Guidelines: A complete evaluation includes at  minimum, Doppler waveform signals and systolic blood pressure reading at the level of bilateral brachial, anterior tibial, and posterior tibial arteries, when vessel segments are accessible. Bilateral testing is considered an integral part of a complete examination. Photoelectric Plethysmograph (PPG) waveforms and toe systolic pressure readings are included as required and additional duplex testing as needed. Limited examinations for reoccurring indications may be performed as noted.  ABI Findings: +---------+------------------+-----+--------+--------+ Right    Rt Pressure (mmHg)IndexWaveformComment  +---------+------------------+-----+--------+--------+ Brachial 165                                     +---------+------------------+-----+--------+--------+ ATA      127               0.75 biphasic         +---------+------------------+-----+--------+--------+ PTA      125               0.74 biphasic         +---------+------------------+-----+--------+--------+ Great Toe105               0.62 Abnormal         +---------+------------------+-----+--------+--------+ +---------+------------------+-----+---------+-------+ Left     Lt Pressure (mmHg)IndexWaveform Comment +---------+------------------+-----+---------+-------+ Brachial 170                                     +---------+------------------+-----+---------+-------+ ATA      203               1.19 triphasic        +---------+------------------+-----+---------+-------+ PTA      182               1.07 biphasic         +---------+------------------+-----+---------+-------+ Great Toe172               1.01 Normal           +---------+------------------+-----+---------+-------+ +-------+-----------+-----------+------------+------------+ ABI/TBIToday's ABIToday's TBIPrevious ABIPrevious TBI +-------+-----------+-----------+------------+------------+ Right  .75        .62        .74         .39           +-------+-----------+-----------+------------+------------+ Left   1.19       1.01       1.11        1.06         +-------+-----------+-----------+------------+------------+  Bilateral ABIs appear essentially unchanged compared to prior study on 01/14/2022. Left TBIs appear essentially unchanged compared to prior study on 01/14/2022. Right TBIs appear to be increased compared to prior study on 01/14/2022.  Summary: Right: Resting right ankle-brachial index indicates moderate right lower extremity arterial disease. The right toe-brachial index is abnormal. Left: Resting left ankle-brachial index is within normal range. The left toe-brachial index is normal. *See table(s) above for measurements and observations.  Electronically signed by [name] MD  on 05/03/2022 at 4:16:48 PM.    Final        Assessment & Plan:   1. Peripheral arterial disease with history of revascularization ([place]) Noninvasive studies show that the patient should be able to have adequate ability to continue with wound healing.  Her previous posterior wound is essentially completely healed at this time just a small scabbing area.  Will plan on having the patient continue with noninvasive studies in approximately 3 months.  With ABIs.  2. Leg swelling The patient's wound was likely related to lower extremity edema.  Her swelling is well-controlled at this time and the wound is healed.  No plan on having her return in 8 weeks just to evaluate swelling patient has been no new or worsening blister or ulcer.  3. Essential hypertension Continue antihypertensive medications as already ordered, these medications have been reviewed and there are no changes at this time.   Current Outpatient Medications on File Prior to Visit  Medication Sig Dispense Refill   albuterol (VENTOLIN HFA) 108 (90 Base) MCG/ACT inhaler TAKE 2 PUFFS BY MOUTH EVERY 6 HOURS AS NEEDED FOR WHEEZE OR SHORTNESS OF BREATH (Patient taking differently: Inhale  2 puffs into the lungs every 6 (six) hours as needed for wheezing or shortness of breath.) 8.5 each 2   apixaban (ELIQUIS) 5 MG TABS tablet Take 1 tablet (5 mg total) by mouth 2 (two) times daily. 180 tablet 2   atorvastatin (LIPITOR) 40 MG tablet Take 1 tablet (40 mg total) by mouth daily. 90 tablet 3   buPROPion (WELLBUTRIN SR) 150 MG 12 hr tablet Take 1 tablet (150 mg total) by mouth 2 (two) times daily. 180 tablet 1   calcitRIOL (ROCALTROL) 0.25 MCG capsule Take 0.25 mcg by mouth daily.     carvedilol (COREG) 25 MG tablet Take 1 tablet (25 mg total) by mouth 2 (two) times daily with a meal. 60 tablet 0   clopidogrel (PLAVIX) 75 MG tablet Take 1 tablet (75 mg total) by mouth daily. 90 tablet 3   Dulaglutide 3 MG/0.5ML SOPN Inject into the skin once a week.     fluticasone (FLONASE) 50 MCG/ACT nasal spray Place 2 sprays into both nostrils daily. 16 g 6   Fluticasone-Umeclidin-Vilant (TRELEGY ELLIPTA) 100-62.5-25 MCG/ACT AEPB Inhale 1 puff into the lungs daily. 28 each 11   Fluticasone-Umeclidin-Vilant (TRELEGY ELLIPTA) 100-62.5-25 MCG/ACT AEPB Inhale 1 puff into the lungs daily. 14 each 0   gabapentin (NEURONTIN) 300 MG capsule Take 300 mg by mouth 2 (two) times daily.     glucose blood test strip OneTouch Verio strips     hydrochlorothiazide (HYDRODIURIL) 25 MG tablet Take 1 tablet by mouth daily.     insulin aspart (NOVOLOG) 100 UNIT/ML injection Insulin pump     Insulin Disposable Pump (OMNIPOD DASH PODS, GEN 4,) MISC USE 1 POD EACH EVERY 72    HOURS     Insulin Human (INSULIN PUMP) SOLN Per endocrinoloy     JARDIANCE 10 MG TABS tablet Take 10 mg by mouth daily.     loratadine (CLARITIN) 10 MG tablet Take 1 tablet by mouth daily.     meclizine (ANTIVERT) 12.5 MG tablet Take 1 tablet (12.5 mg total) by mouth 3 (three) times daily as needed for dizziness. 30 tablet 0   OXYGEN Place 2 L into the nose continuous.     pantoprazole (PROTONIX) 40 MG tablet Take 1 tablet (40 mg total) by mouth 2  (two) times daily. 60 tablet 0  PARoxetine (PAXIL) 40 MG tablet Take 1 tablet (40 mg total) by mouth daily. 90 tablet 1   sacubitril-valsartan (ENTRESTO) 24-26 MG Take 1 tablet by mouth 2 (two) times daily. 60 tablet 3   Spacer/Aero-Holding Chambers (OPTICHAMBER DIAMOND) MISC Use with inhaler to ensure medication is delivered throughout lungs 1 each 0   Vitamin D, Ergocalciferol, (DRISDOL) 1.25 MG (50000 UNIT) CAPS capsule Take 50,000 Units by mouth every 7 (seven) days.     No current facility-administered medications on file prior to visit.    There are no Patient Instructions on file for this visit. No follow-ups on file.   Kris Hartmann, NP

## 2022-05-05 ENCOUNTER — Ambulatory Visit: Payer: Medicare Other | Attending: Family | Admitting: Family

## 2022-05-05 ENCOUNTER — Encounter: Payer: Self-pay | Admitting: Family

## 2022-05-05 VITALS — BP 103/52 | HR 78 | Wt 211.6 lb

## 2022-05-05 DIAGNOSIS — I130 Hypertensive heart and chronic kidney disease with heart failure and stage 1 through stage 4 chronic kidney disease, or unspecified chronic kidney disease: Secondary | ICD-10-CM | POA: Insufficient documentation

## 2022-05-05 DIAGNOSIS — I739 Peripheral vascular disease, unspecified: Secondary | ICD-10-CM

## 2022-05-05 DIAGNOSIS — R0602 Shortness of breath: Secondary | ICD-10-CM | POA: Diagnosis not present

## 2022-05-05 DIAGNOSIS — I428 Other cardiomyopathies: Secondary | ICD-10-CM | POA: Diagnosis not present

## 2022-05-05 DIAGNOSIS — I5032 Chronic diastolic (congestive) heart failure: Secondary | ICD-10-CM

## 2022-05-05 DIAGNOSIS — R42 Dizziness and giddiness: Secondary | ICD-10-CM | POA: Insufficient documentation

## 2022-05-05 DIAGNOSIS — D631 Anemia in chronic kidney disease: Secondary | ICD-10-CM | POA: Diagnosis not present

## 2022-05-05 DIAGNOSIS — Z794 Long term (current) use of insulin: Secondary | ICD-10-CM | POA: Insufficient documentation

## 2022-05-05 DIAGNOSIS — I10 Essential (primary) hypertension: Secondary | ICD-10-CM

## 2022-05-05 DIAGNOSIS — Z7984 Long term (current) use of oral hypoglycemic drugs: Secondary | ICD-10-CM | POA: Diagnosis not present

## 2022-05-05 DIAGNOSIS — N189 Chronic kidney disease, unspecified: Secondary | ICD-10-CM | POA: Diagnosis not present

## 2022-05-05 DIAGNOSIS — G8929 Other chronic pain: Secondary | ICD-10-CM | POA: Insufficient documentation

## 2022-05-05 DIAGNOSIS — J449 Chronic obstructive pulmonary disease, unspecified: Secondary | ICD-10-CM | POA: Insufficient documentation

## 2022-05-05 DIAGNOSIS — E1129 Type 2 diabetes mellitus with other diabetic kidney complication: Secondary | ICD-10-CM | POA: Diagnosis not present

## 2022-05-05 DIAGNOSIS — N184 Chronic kidney disease, stage 4 (severe): Secondary | ICD-10-CM | POA: Diagnosis not present

## 2022-05-05 DIAGNOSIS — I509 Heart failure, unspecified: Secondary | ICD-10-CM | POA: Diagnosis not present

## 2022-05-05 DIAGNOSIS — F17210 Nicotine dependence, cigarettes, uncomplicated: Secondary | ICD-10-CM | POA: Insufficient documentation

## 2022-05-05 DIAGNOSIS — R5383 Other fatigue: Secondary | ICD-10-CM | POA: Insufficient documentation

## 2022-05-05 DIAGNOSIS — F32A Depression, unspecified: Secondary | ICD-10-CM | POA: Diagnosis not present

## 2022-05-05 DIAGNOSIS — E1122 Type 2 diabetes mellitus with diabetic chronic kidney disease: Secondary | ICD-10-CM | POA: Insufficient documentation

## 2022-05-05 DIAGNOSIS — Z8673 Personal history of transient ischemic attack (TIA), and cerebral infarction without residual deficits: Secondary | ICD-10-CM | POA: Diagnosis not present

## 2022-05-05 DIAGNOSIS — F419 Anxiety disorder, unspecified: Secondary | ICD-10-CM | POA: Insufficient documentation

## 2022-05-05 NOTE — Progress Notes (Signed)
Patient ID: Jocelyn Wells, female    DOB: 03-05-1955, 67 y.o.   MRN: [id number]  HPI  Jocelyn Wells is a 67 y/o female with a history of DM, HTN, CKD, stroke, anemia, anxiety, COPD, depression, leukocytosis, tobacco use and chronic heart failure.   Echo 02/24/22: EF 60-65%. Echo 09/29/21: EF of 45-50% along with moderate LVH & mild LAE.   RHC/LHC 03/25/15: Severe left ventricular systolic dysfunction with EF 25-30% Elevated pulmonary Order wedge pressure and upper normal to mildly elevated LVEDP  Normal coronary arteries.  Severe systolic left ventricular dysfunction with EF 25-30%. Mildly elevated LVEDP and moderate pulmonary capillary wedge elevation.  Was in the ED 02/26/22 due to abnormal labs. Admitted 01/01/22 due to fatigue and dyspnea, also increasing bilateral lower extremity swelling. On presentation Jocelyn Wells was requiring 2 L of oxygen to maintain oxygen saturation.  Chest x-ray showed cardiomegaly, mild vascular congestion, bibasilar edema. Needed blood transfusion due to anemia. Initially given IV lasix. Transitioned to oral diuretics. Wound consult done. Discharged after 3 days. Was in the ED 12/07/21 due to abnormal labs from PCP office but LWBS.   Jocelyn Wells presents today for a HF follow-up visit with a chief complaint of moderate fatigue with minimal exertion. Chronic in nature. Has associated visual difficulties, SOB, wheezing, light-headedness and chronic pain along with this. Denies difficulty sleeping, abdominal distention, palpitations, pedal edema, chest pain or weight gain.   Has bilateral lower legs wrapped in [place]NA wraps.   Supposed to be wearing oxygen around the clock but Jocelyn Wells currently only wears it at bedtime as Jocelyn Wells's waiting on a portable oxygen tank.  Says that Jocelyn Wells will be needing cataract surgery and needs clearance to hold plavix and eliquis for 1 week prior. Also says that Jocelyn Wells will be having a fistula placed in case Jocelyn Wells would need dialysis.    Past Medical History:   Diagnosis Date   Anemia    Anxiety    Cardiomyopathy [facility])    new to her Jan 2017   CHF (congestive heart failure) (HCC)    Chronic kidney disease    COPD (chronic obstructive pulmonary disease) (HCC)    Depression    Diabetes mellitus, type II ([place])    HTN (hypertension)    Leukocytosis    Obesity    Osteoporosis    PONV (postoperative nausea and vomiting)    Stroke [facility])    Jan 2017   Past Surgical History:  Procedure Laterality Date   ABDOMINAL HYSTERECTOMY     ANKLE FRACTURE SURGERY Left 02/09/2000   BILATERAL SALPINGOOPHORECTOMY  02/08/1998   CARDIAC CATHETERIZATION N/A 03/25/2015   Procedure: Right/Left Heart Cath and Coronary Angiography;  Surgeon: Belva Crome, MD;  Location: [place] [facility];  Service: Cardiovascular;  Laterality: N/A;   CESAREAN SECTION  02/09/1976   COLONOSCOPY WITH PROPOFOL N/A 09/22/2016   Procedure: COLONOSCOPY WITH PROPOFOL;  Surgeon: Manya Silvas, MD;  Location: [facility] ENDOSCOPY;  Service: Endoscopy;  Laterality: N/A;   COLONOSCOPY WITH PROPOFOL N/A 02/15/2022   Procedure: COLONOSCOPY WITH PROPOFOL;  Surgeon: Lin Landsman, MD;  Location: [facility] ENDOSCOPY;  Service: Gastroenterology;  Laterality: N/A;   EP IMPLANTABLE DEVICE N/A 08/05/2015   Procedure: Loop Recorder Insertion;  Surgeon: Deboraha Sprang, MD;  Location: [place] [facility];  Service: Cardiovascular;  Laterality: N/A;   ESOPHAGOGASTRODUODENOSCOPY N/A 02/15/2022   Procedure: ESOPHAGOGASTRODUODENOSCOPY (EGD);  Surgeon: Lin Landsman, MD;  Location: [facility] ENDOSCOPY;  Service: Gastroenterology;  Laterality: N/A;   ESOPHAGOGASTRODUODENOSCOPY (EGD) WITH  PROPOFOL N/A 09/22/2016   Procedure: ESOPHAGOGASTRODUODENOSCOPY (EGD) WITH PROPOFOL;  Surgeon: Manya Silvas, MD;  Location: [facility] ENDOSCOPY;  Service: Endoscopy;  Laterality: N/A;   ESOPHAGOGASTRODUODENOSCOPY (EGD) WITH PROPOFOL N/A 12/08/2016   Procedure: ESOPHAGOGASTRODUODENOSCOPY (EGD) WITH PROPOFOL;  Surgeon: Manya Silvas, MD;  Location: [facility] ENDOSCOPY;  Service: Endoscopy;  Laterality: N/A;   FRACTURE SURGERY     GIVENS CAPSULE STUDY N/A 04/19/2022   Procedure: GIVENS CAPSULE STUDY;  Surgeon: Lin Landsman, MD;  Location: [facility] ENDOSCOPY;  Service: Gastroenterology;  Laterality: N/A;   KNEE ARTHROSCOPY Left 02/09/2003   LOWER EXTREMITY ANGIOGRAPHY Left 08/12/2021   Procedure: Lower Extremity Angiography;  Surgeon: Katha Cabal, MD;  Location: [place] [facility];  Service: Cardiovascular;  Laterality: Left;   TEE WITHOUT CARDIOVERSION N/A 01/31/2015   Procedure: TRANSESOPHAGEAL ECHOCARDIOGRAM (TEE);  Surgeon: Lelon Perla, MD;  Location: [facility] ENDOSCOPY;  Service: Cardiovascular;  Laterality: N/A;   TEMPORARY DIALYSIS CATHETER N/A 08/14/2021   Procedure: TEMPORARY DIALYSIS CATHETER;  Surgeon: Katha Cabal, MD;  Location: [place] [facility];  Service: Cardiovascular;  Laterality: N/A;   TUBAL LIGATION  02/09/1976   ULNAR NERVE TRANSPOSITION  02/08/2006   Family History  Problem Relation Age of Onset   Heart disease Mother        died from CHF   Asthma Mother    Diabetes Mother    Heart disease Father    Aneurysm Father    COPD Brother    Diabetes Brother    Alcohol abuse Paternal Aunt    Cancer Maternal Grandmother        unknownorigin   Anemia Neg Hx    Arrhythmia Neg Hx    Clotting disorder Neg Hx    Fainting Neg Hx    Heart attack Neg Hx    Heart failure Neg Hx    Hyperlipidemia Neg Hx    Hypertension Neg Hx    Social History   Tobacco Use   Smoking status: Some Days    Packs/day: 0.50    Years: 47.00    Additional pack years: 0.00    Total pack years: 23.50    Types: Cigarettes    Start date: 02/08/1974   Smokeless tobacco: Never   Tobacco comments:    Quit 01/18/2022  Substance Use Topics   Alcohol use: No    Alcohol/week: 0.0 standard drinks of alcohol   Allergies  Allergen Reactions   Codeine Nausea And Vomiting   Ivp Dye [Iodinated Contrast  Media]     Patients states the IV Dye shuts her kidneys down   Prior to Admission medications   Medication Sig Start Date End Date Taking? Authorizing Provider  albuterol (VENTOLIN HFA) 108 (90 Base) MCG/ACT inhaler TAKE 2 PUFFS BY MOUTH EVERY 6 HOURS AS NEEDED FOR WHEEZE OR SHORTNESS OF BREATH Patient taking differently: Inhale 2 puffs into the lungs every 6 (six) hours as needed for wheezing or shortness of breath. 06/19/21  Yes Gwyneth Sprout, FNP  apixaban (ELIQUIS) 5 MG TABS tablet Take 1 tablet (5 mg total) by mouth 2 (two) times daily. 02/11/22  Yes Hammock, Barbera Setters, NP  atorvastatin (LIPITOR) 40 MG tablet Take 1 tablet (40 mg total) by mouth daily. 09/08/21  Yes Gwyneth Sprout, FNP  buPROPion (WELLBUTRIN SR) 150 MG 12 hr tablet Take 1 tablet (150 mg total) by mouth 2 (two) times daily. 01/18/22  Yes Tally Joe T, FNP  calcitRIOL (ROCALTROL) 0.25 MCG capsule Take 0.25 mcg by mouth  daily. 03/15/22  Yes [provider]  carvedilol (COREG) 25 MG tablet Take 1 tablet (25 mg total) by mouth 2 (two) times daily with a meal. 01/04/22  Yes Adhikari, Tamsen Meek, MD  clopidogrel (PLAVIX) 75 MG tablet Take 1 tablet (75 mg total) by mouth daily. 10/23/21  Yes Hammock, Barbera Setters, NP  Dulaglutide 3 MG/0.5ML SOPN Inject into the skin once a week. 04/17/21  Yes [provider]  fluticasone (FLONASE) 50 MCG/ACT nasal spray Place 2 sprays into both nostrils daily. 02/13/21  Yes Gwyneth Sprout, FNP  gabapentin (NEURONTIN) 300 MG capsule Take 300 mg by mouth 2 (two) times daily.   Yes [provider]  glucose blood test strip OneTouch Verio strips   Yes [provider]  hydrALAZINE (APRESOLINE) 25 MG tablet Take 1 tablet (25 mg total) by mouth 2 (two) times daily. 04/23/22  Yes Darylene Price A, FNP  insulin aspart (NOVOLOG) 100 UNIT/ML injection Insulin pump 05/26/20  Yes [provider]  Insulin Disposable Pump (OMNIPOD DASH PODS, GEN 4,) MISC USE 1 POD EACH EVERY 72    HOURS 10/29/20   Yes [provider]  Insulin Human (INSULIN PUMP) SOLN Per endocrinoloy 06/01/20  Yes Elgergawy, Silver Huguenin, MD  JARDIANCE 10 MG TABS tablet Take 10 mg by mouth daily. 04/05/22  Yes [provider]  loratadine (CLARITIN) 10 MG tablet Take 1 tablet by mouth daily.   Yes [provider]  meclizine (ANTIVERT) 12.5 MG tablet Take 1 tablet (12.5 mg total) by mouth 3 (three) times daily as needed for dizziness. 11/23/21  Yes Myles Gip, DO  OXYGEN Place 2 L into the nose continuous.   Yes [provider]  PARoxetine (PAXIL) 40 MG tablet Take 1 tablet (40 mg total) by mouth daily. 04/10/22 10/07/22 Yes Hisada, Elie Goody, MD  sacubitril-valsartan (ENTRESTO) 24-26 MG Take 1 tablet by mouth 2 (two) times daily. 03/22/22  Yes Alisa Graff, FNP  Spacer/Aero-Holding Chambers [facility] DIAMOND) MISC Use with inhaler to ensure medication is delivered throughout lungs 02/13/21  Yes Tally Joe T, FNP  torsemide (DEMADEX) 20 MG tablet TAKE 3 TABLETS BY MOUTH EVERY DAY 04/26/22  Yes Darylene Price A, FNP  Vitamin D, Ergocalciferol, (DRISDOL) 1.25 MG (50000 UNIT) CAPS capsule Take 50,000 Units by mouth every 7 (seven) days. 01/24/22  Yes [provider]  Fluticasone-Umeclidin-Vilant (TRELEGY ELLIPTA) 100-62.5-25 MCG/ACT AEPB Inhale 1 puff into the lungs daily. Patient not taking: Reported on 05/05/2022 01/29/22   Tyler Pita, MD  Fluticasone-Umeclidin-Vilant (TRELEGY ELLIPTA) 100-62.5-25 MCG/ACT AEPB Inhale 1 puff into the lungs daily. Patient not taking: Reported on 05/05/2022 01/29/22   Tyler Pita, MD  hydrochlorothiazide (HYDRODIURIL) 25 MG tablet Take 1 tablet by mouth daily. Patient not taking: Reported on 05/05/2022 03/02/22   [provider]  pantoprazole (PROTONIX) 40 MG tablet Take 1 tablet (40 mg total) by mouth 2 (two) times daily. Patient not taking: Reported on 05/05/2022 08/23/21   Sharen Hones, MD    Review of Systems  Constitutional:   Positive for fatigue (easily). Negative for appetite change.  HENT:  Negative for congestion, postnasal drip and sore throat.   Eyes:  Positive for visual disturbance (blurry vision).  Respiratory:  Positive for shortness of breath (at times) and wheezing (at times). Negative for cough and chest tightness.   Cardiovascular:  Negative for chest pain, palpitations and leg swelling.  Gastrointestinal:  Negative for abdominal distention and abdominal pain.  Endocrine: Negative.   Genitourinary: Negative.  Musculoskeletal:  Positive for arthralgias (knees) and back pain.  Skin:  Positive for wound (right leg).  Allergic/Immunologic: Negative.   Neurological:  Positive for light-headedness (off balance). Negative for dizziness.  Hematological:  Negative for adenopathy. Does not bruise/bleed easily.  Psychiatric/Behavioral:  Positive for dysphoric mood. Negative for sleep disturbance (sleeping on 1 pillow with oxygen at 2L). The patient is not nervous/anxious.    Vitals:   05/05/22 1409  BP: (!) 103/52  Pulse: 78  SpO2: 94%  Weight: 211 lb 9.6 oz (96 kg)   Wt Readings from Last 3 Encounters:  05/05/22 211 lb 9.6 oz (96 kg)  05/03/22 209 lb (94.8 kg)  04/21/22 209 lb (94.8 kg)   Lab Results  Component Value Date   CREATININE 3.27 (H) 02/26/2022   CREATININE 3.40 (H) 02/11/2022   CREATININE 3.41 (H) 02/03/2022   Physical Exam Vitals and nursing note reviewed. Exam conducted with a chaperone present (son).  Constitutional:      Appearance: Normal appearance.  HENT:     Head: Normocephalic and atraumatic.  Cardiovascular:     Rate and Rhythm: Normal rate and regular rhythm.  Pulmonary:     Effort: Pulmonary effort is normal. No respiratory distress.     Breath sounds: No wheezing or rales.  Abdominal:     General: There is no distension.     Palpations: Abdomen is soft.     Tenderness: There is no abdominal tenderness.  Musculoskeletal:        General: No tenderness.      Cervical back: Normal range of motion and neck supple.     Right lower leg: No tenderness. No edema (wrapped in [place] boot).     Left lower leg: No tenderness. No edema (wrapped in [place] boot).  Skin:    General: Skin is warm and dry.  Neurological:     General: No focal deficit present.     Mental Status: Jocelyn Wells is alert and oriented to person, place, and time.  Psychiatric:        Mood and Affect: Mood normal.        Behavior: Behavior normal.   Assessment & Plan:  1: NICM with preserved ejection fraction- - NYHA class III - euvolemic today - weighing daily; reminded to call for an overnight weight gain of > 2 pounds or a weekly weight gain of > 5 pounds - weight down 12 pounds from last visit here 3 months ago - echo 02/24/22: EF 60-65%. Echo 09/29/21: EF of 45-50% along with moderate LVH & mild LAE.  - RHC/LHC 03/25/15: Severe left ventricular systolic dysfunction with EF 25-30%. Elevated pulmonary Order wedge pressure and upper normal to mildly elevated LVEDP. Normal coronary arteries. Severe systolic left ventricular dysfunction with EF 25-30%. Mildly elevated LVEDP and moderate pulmonary capillary wedge elevation. - not adding salt to her food - continue carvedilol 25mg  BID - continue entresto 24/26mg  BID - continue jardiance 10mg  daily - torsemide 60mg  daliy - due to renal function, not a candidate for spironolactone  - saw cardiology Tera Mater) 02/11/22 - saw [facility] provider (Ridgely) 01/26/22  - currently has lower legs wrapped in [place] boots and being changed by home health nurse - BNP 01/26/22 was 2195.7 - participating in ventricle health program  2: HTN with CKD- - BP 103/52 - saw PCP Rollene Rotunda) 01/18/22 - BMP 04/22/22 reviewed and showed sodium 142, potassium 4.2, creatinine 3.7 & GFR 13 - saw nephrology Candiss Norse) 03/25/22  3: COPD- - saw pulmonology Patsey Berthold) 01/29/22 -  wears oxygen at 2L at bedtime - waiting on portable tank so that Jocelyn Wells can wear oxygen 24/7  4: Anemia- -  went to cancer center on 03/31/22 - hemoglobin 01/04/22 was 8.6 - has colonoscopy/ EGD scheduled for 02/2022 - received iron infusion 03/22/22  5: DM- - saw endocrinology (Solum) 04/26/22 - A1c 04/22/22 was 7.8; previously was 10.1% - home glucose was ~ 100 this morning  6: PAD- - saw vascular (Schnier) 05/03/22; will be placing fistula for possible dialysis - no cardiac concern regarding fistula placement; weight has declined, lungs CTA, no edema and EF has improved; patient is cleared for surgery   Advised patient that cataract clearance request would need to go to [facility] cardiology to make decision about whether her plavix and eliquis can be stopped for the week prior. Has been on these, Jocelyn Wells thinks, since her stroke.   Return in 3 months, sooner if needed.

## 2022-05-07 ENCOUNTER — Other Ambulatory Visit: Payer: Self-pay | Admitting: *Deleted

## 2022-05-07 DIAGNOSIS — I152 Hypertension secondary to endocrine disorders: Secondary | ICD-10-CM | POA: Diagnosis not present

## 2022-05-07 DIAGNOSIS — S81801D Unspecified open wound, right lower leg, subsequent encounter: Secondary | ICD-10-CM | POA: Diagnosis not present

## 2022-05-07 DIAGNOSIS — E1122 Type 2 diabetes mellitus with diabetic chronic kidney disease: Secondary | ICD-10-CM | POA: Diagnosis not present

## 2022-05-07 DIAGNOSIS — D500 Iron deficiency anemia secondary to blood loss (chronic): Secondary | ICD-10-CM

## 2022-05-07 DIAGNOSIS — E1159 Type 2 diabetes mellitus with other circulatory complications: Secondary | ICD-10-CM | POA: Diagnosis not present

## 2022-05-07 DIAGNOSIS — N184 Chronic kidney disease, stage 4 (severe): Secondary | ICD-10-CM | POA: Diagnosis not present

## 2022-05-07 DIAGNOSIS — I5032 Chronic diastolic (congestive) heart failure: Secondary | ICD-10-CM | POA: Diagnosis not present

## 2022-05-09 ENCOUNTER — Encounter: Payer: Self-pay | Admitting: Licensed Clinical Social Worker

## 2022-05-09 NOTE — Progress Notes (Addendum)
THERAPIST PROGRESS NOTE  Session Time: 2:00PM-2:47PM  Participation Level: Active  Behavioral Response: Casual and Well GroomedAlertDepressed  Type of Therapy: Individual Therapy  Treatment Goals addressed:  Reduce overall frequency, intensity and duration of anxiety so that daily functioning is not impaired per pt self report 3 out of 5 sessions.   Reduce frequency, intensity, and duration of depression symptoms so that daily functioning is improved.  Increase coping skills to manage depression and improve ability to perform daily activities.   ProgressTowards Goals: Progressing  Interventions: CBT, DBT, Solution Focused, Strength-based, and Other: ACT  Summary: Jocelyn Wells is a 67 y.o. female who presents with mixed sxs of anxiety and depression related to a hx of trauma and current stressors. Sxs endorsed including but not limited to lack of motivation, fatigue, depressed mood, flashbacks, worry, difficulty controlling worry, irritability, and isolation. Pt oriented to person, place, and time. Pt denies SI/HI or A/V hallucinations. Pt was cooperative during visit and was engaged throughout the visit. Pt does not report any other concerns at the time of visit.  Pt provided insight into current health concern of cataracts. Pt shared health improvements and reported having a good A1C. Pt shared insight into her relationship with herself and her relationship with her body inspired by her chronic illnesses and reported desire to improve relationship with self and body.   Pt reported that her mood has been up and down. Pt identified primary concern to be feeling isolated and wanting to get out more. Pt identified barriers to getting out more such as not being able to drive or walk well on her own. Pt identified goal of going to the grocery store. Pt identified mental barriers to be burdening her husband and feeling it is easier for him to go alone. Discussed thoughts behind what is easy  and what is healthy being different sometimes. Pt provided her thoughts on that concept and agreed that in this situation what is easy is not what is healthiest for her. Introduced pt to idea of rebuilding mental stamina. Set goal of riding with husband to store more times than not and at least once a month taking scooter and going into store with him. Pt identified strengths to help her achieve goal to be her and her husband being a strong team.   Pt reported enjoying being independent. Discussed grief attached to independence looking different. Provided reframe that different is not bad and also is different.   Pt identified motivators to be her grandkids.  Pt is continuing to apply interventions/techniques learned in session into daily life situations. Pt is currently on track to meet goals utilizing interventions that are discussed in session. Treatment to continue as indicated. Personal growth and progress toward goals noted above.  LCSW provided mood monitoring and treatment progress review in the context of this episode of treatment. LCSW reviewed the pt's mood status since last session.   Continued Recommendations as followed: Self-care behaviors, positive social engagements, focusing on positive physical and emotional wellness, and focusing on life/work balance.   Virtual Visit via Video Note  I connected with Jocelyn Wells on 04/28/2022 at  2:00 PM EDT by a video enabled telemedicine application and verified that I am speaking with the correct person using two identifiers.  Location: Patient: located in pt's home Provider: [facility]   I discussed the limitations of evaluation and management by telemedicine and the availability of in person appointments. The patient expressed understanding and agreed to proceed.   I  discussed the assessment and treatment plan with the patient. The patient was provided an opportunity to ask questions and all were answered. The patient agreed with the plan  and demonstrated an understanding of the instructions.   The patient was advised to call back or seek an in-person evaluation if the symptoms worsen or if the condition fails to improve as anticipated.  I provided 47 minutes of non-face-to-face time during this encounter.   Blair Dolphin, LCSW   Suicidal/Homicidal: Nowithout intent/plan  Therapist Response:  Provided pt education re: acceptance. Discussed how to make acceptance accessible at all parts of pt's healing journey.   Approached pt with strengths based perspective to assist pt in exploring strengths in moments of feeling low.   LCSW practiced active listening to validate pt participation, build rapport, and create safe space for pt to feel heard as they are disclosing their thoughts and feelings.   LCSW utilized therapeutic conversation skills informed by CBT, DBT, and ACT to expose pt to multiple ways of thinking about healing and to provide pt to access to multiple interventions.  LCSW introduced pt to action planning. Pt practiced naming long term goal and worked with LCSW to define measurable "mile marker" goals that feel achievable to herself as she is now to assist her in pursuing her long term goal.   Introduced pt to Dialectical Behavior Therapy and the importance of acceptance and change. Discussed concept of radical acceptance and also assisted pt in learning barriers to engaging in radical acceptance in journey towards change. Discussed importance of leaning into the dialectic and taught pt "and also" statements to assist in engaging with the bothness of change.    LCSW introduced pt to Acceptance and Commitment Therapy. Pt engaged in discussion on how to explore what they must accept in order to commit to what they have identified as important. Introduced pt to values directed goal exploration in order to identify goals of importance.    Introduced pt to "blue sky" qualities-things that are always true about self no  matter what clouds are in the sky of life. Discussed how people identify with their temporary clouds as opposed to their consistent blue sky, because the clouds block their view of their true self. Invited pt to explore their blue sky qualities to identify areas where pt has not lost self and areas where pt has grown.   Plan: Return again in 3 weeks.  Diagnosis: PTSD (post-traumatic stress disorder)  MDD (major depressive disorder), recurrent, in partial remission ([place])     02/17/2022    2:53 PM 01/18/2022    3:11 PM 12/11/2021    5:19 PM  Depression screen PHQ 2/9  Decreased Interest 1 0 1  Down, Depressed, Hopeless 0 0 1  PHQ - 2 Score 1 0 2  Altered sleeping 1 3   Tired, decreased energy 1 3   Change in appetite 1 0   Feeling bad or failure about yourself  0 0   Trouble concentrating 0 0   Moving slowly or fidgety/restless 0 0   Suicidal thoughts 0 0   PHQ-9 Score 4 6   Difficult doing work/chores Somewhat difficult Not difficult at all        02/17/2022    2:54 PM  GAD 7 : Generalized Anxiety Score  Nervous, Anxious, on Edge 1  Control/stop worrying 0  Worry too much - different things 1  Trouble relaxing 1  Restless 1  Easily annoyed or irritable 1  Afraid -  awful might happen 1  Total GAD 7 Score 6  Anxiety Difficulty Somewhat difficult      Collaboration of Care: Psychiatrist AEB Dr. Modesta Messing  Patient/Guardian was advised Release of Information must be obtained prior to any record release in order to collaborate their care with an outside provider. Patient/Guardian was advised if they have not already done so to contact the registration department to sign all necessary forms in order for [place] to release information regarding their care.   Consent: Patient/Guardian gives verbal consent for treatment and assignment of benefits for services provided during this visit. Patient/Guardian expressed understanding and agreed to proceed.   Blair Dolphin, LCSW 05/09/2022

## 2022-05-10 ENCOUNTER — Telehealth: Payer: Self-pay | Admitting: *Deleted

## 2022-05-10 ENCOUNTER — Inpatient Hospital Stay (HOSPITAL_BASED_OUTPATIENT_CLINIC_OR_DEPARTMENT_OTHER): Payer: Medicare Other | Admitting: Oncology

## 2022-05-10 ENCOUNTER — Inpatient Hospital Stay: Payer: Medicare Other | Attending: Oncology

## 2022-05-10 ENCOUNTER — Encounter: Payer: Self-pay | Admitting: Oncology

## 2022-05-10 VITALS — BP 144/62 | HR 68 | Temp 96.5°F | Resp 18 | Ht 66.0 in | Wt 210.0 lb

## 2022-05-10 DIAGNOSIS — D631 Anemia in chronic kidney disease: Secondary | ICD-10-CM | POA: Diagnosis not present

## 2022-05-10 DIAGNOSIS — D72829 Elevated white blood cell count, unspecified: Secondary | ICD-10-CM

## 2022-05-10 DIAGNOSIS — D75839 Thrombocytosis, unspecified: Secondary | ICD-10-CM | POA: Diagnosis not present

## 2022-05-10 DIAGNOSIS — D509 Iron deficiency anemia, unspecified: Secondary | ICD-10-CM | POA: Diagnosis not present

## 2022-05-10 DIAGNOSIS — N184 Chronic kidney disease, stage 4 (severe): Secondary | ICD-10-CM | POA: Diagnosis not present

## 2022-05-10 DIAGNOSIS — D500 Iron deficiency anemia secondary to blood loss (chronic): Secondary | ICD-10-CM

## 2022-05-10 DIAGNOSIS — Z79899 Other long term (current) drug therapy: Secondary | ICD-10-CM | POA: Diagnosis not present

## 2022-05-10 LAB — CBC WITH DIFFERENTIAL/PLATELET
Abs Immature Granulocytes: 0.05 10*3/uL (ref 0.00–0.07)
Basophils Absolute: 0.1 10*3/uL (ref 0.0–0.1)
Basophils Relative: 1 %
Eosinophils Absolute: 0.6 10*3/uL — ABNORMAL HIGH (ref 0.0–0.5)
Eosinophils Relative: 5 %
HCT: 36.1 % (ref 36.0–46.0)
Hemoglobin: 10.9 g/dL — ABNORMAL LOW (ref 12.0–15.0)
Immature Granulocytes: 0 %
Lymphocytes Relative: 11 %
Lymphs Abs: 1.2 10*3/uL (ref 0.7–4.0)
MCH: 27.2 pg (ref 26.0–34.0)
MCHC: 30.2 g/dL (ref 30.0–36.0)
MCV: 90 fL (ref 80.0–100.0)
Monocytes Absolute: 0.8 10*3/uL (ref 0.1–1.0)
Monocytes Relative: 7 %
Neutro Abs: 9 10*3/uL — ABNORMAL HIGH (ref 1.7–7.7)
Neutrophils Relative %: 76 %
Platelets: 625 10*3/uL — ABNORMAL HIGH (ref 150–400)
RBC: 4.01 MIL/uL (ref 3.87–5.11)
RDW: 18.7 % — ABNORMAL HIGH (ref 11.5–15.5)
WBC: 11.8 10*3/uL — ABNORMAL HIGH (ref 4.0–10.5)
nRBC: 0 % (ref 0.0–0.2)

## 2022-05-10 LAB — IRON AND TIBC
Iron: 53 ug/dL (ref 28–170)
Saturation Ratios: 18 % (ref 10.4–31.8)
TIBC: 301 ug/dL (ref 250–450)
UIBC: 248 ug/dL

## 2022-05-10 LAB — FERRITIN: Ferritin: 40 ng/mL (ref 11–307)

## 2022-05-10 NOTE — Progress Notes (Signed)
Patient wants to know how her iron is.

## 2022-05-10 NOTE — Telephone Encounter (Signed)
-----   Message from Sindy Guadeloupe, MD sent at 05/10/2022  1:10 PM EDT ----- Goal ferritin in her case will be close to 100 since she has CKD. We could do venofer X3 if she is agreeable --acr

## 2022-05-10 NOTE — Telephone Encounter (Signed)
Called the pt and let her know that because of the kidney disease that she wants the ferritin to be near 100 and hers his 40. Dr. Janese Banks wanted her to get 3 ore iron infusions.  Patient agreeable and can't do Thursdays at all. We will try to start next week and pt ok with this.  Sending a message to [facility] about scheduling for 3 doses of  venofer

## 2022-05-10 NOTE — Progress Notes (Signed)
Hematology/Oncology Consult note [facility]  [phone] [phone]  Patient Care Team: Gwyneth Sprout, FNP as PCP - General (Family Medicine) Gabriel Carina, Betsey Holiday, MD as Physician Assistant (Endocrinology) Deboraha Sprang, MD as Consulting Physician (Cardiology) Rainey Pines, MD as Referring Physician (Psychiatry) Ralene Bathe, MD as Consulting Physician (Dermatology) Dimmig, Marcello Moores, MD as Referring Physician (Orthopedic Surgery) Schnier, Dolores Lory, MD (Vascular Surgery) Lynnell Dike, [place] (Optometry) Germaine Pomfret, [facility] (Pharmacist) Tyler Pita, MD as Consulting Physician (Pulmonary Disease) Neldon Labella, RN as Case Manager   Name of the patient: Jocelyn Wells  [id number]  01/03/1956   Date of visit: 05/10/22  Diagnosis- 1.  Iron deficiency anemia of unclear etiology 2.  Thrombocytosis workup ongoing  Chief complaint/ Reason for visit-routine follow-up of iron deficiency anemia and thrombocytosis  Heme/Onc history: patient is a 67 year old female with a past medical history significant for smoking, CKD, hypertension hyperlipidemia, COPD among other medical problems.She has been referred for leukocytosis.  Most recent CBC from 12/07/2021 showed white cell count of 13.9, H&H of 7.2/24.6 with an MCV of 80.9 and a platelet count of 535.  Differential is mainly shown neutrophilia with occasional monocytosis her white cell count at baseline up until July 2023 was around 11 and it dropped down to 8.2 in August 2023.  She has had some borderline microcytosis over the years.  No recent iron studies have been checked.  She has not seen GI recently.  Is any vaginal bleeding.  Denies any blood loss in her stool or urine.  Denies any dark melanotic stools.  Denies any consistent use of NSAIDs    Patient had an EGD and colonoscopy in January 2024 by Dr. Marius Ditch we did not show any evidence of bleeding.  Endoscopy revealed mild chronic  gastritis that was negative for H. pylori.She also had a capsule study but I do not have those results available.   Interval history-she has baseline fatigue which remains unchanged.  Denies any blood loss in her stool or urine.  ECOG PS- 2 Pain scale- 3 Opioid associated constipation- no  Review of systems- Review of Systems  Constitutional:  Positive for malaise/fatigue. Negative for chills, fever and weight loss.  HENT:  Negative for congestion, ear discharge and nosebleeds.   Eyes:  Negative for blurred vision.  Respiratory:  Negative for cough, hemoptysis, sputum production, shortness of breath and wheezing.   Cardiovascular:  Negative for chest pain, palpitations, orthopnea and claudication.  Gastrointestinal:  Negative for abdominal pain, blood in stool, constipation, diarrhea, heartburn, melena, nausea and vomiting.  Genitourinary:  Negative for dysuria, flank pain, frequency, hematuria and urgency.  Musculoskeletal:  Negative for back pain, joint pain and myalgias.  Skin:  Negative for rash.  Neurological:  Negative for dizziness, tingling, focal weakness, seizures, weakness and headaches.  Endo/Heme/Allergies:  Does not bruise/bleed easily.  Psychiatric/Behavioral:  Negative for depression and suicidal ideas. The patient does not have insomnia.       Allergies  Allergen Reactions   Codeine Nausea And Vomiting   Ivp Dye [Iodinated Contrast Media]     Patients states the IV Dye shuts her kidneys down     Past Medical History:  Diagnosis Date   Anemia    Anxiety    Cardiomyopathy    new to her Jan 2017   CHF (congestive heart failure)    Chronic kidney disease    COPD (chronic obstructive pulmonary disease)    Depression  Diabetes mellitus, type II    HTN (hypertension)    Leukocytosis    Obesity    Osteoporosis    PONV (postoperative nausea and vomiting)    Stroke    Jan 2017     Past Surgical History:  Procedure Laterality Date   ABDOMINAL  HYSTERECTOMY     ANKLE FRACTURE SURGERY Left 02/09/2000   BILATERAL SALPINGOOPHORECTOMY  02/08/1998   CARDIAC CATHETERIZATION N/A 03/25/2015   Procedure: Right/Left Heart Cath and Coronary Angiography;  Surgeon: Belva Crome, MD;  Location: [place] [facility];  Service: Cardiovascular;  Laterality: N/A;   CESAREAN SECTION  02/09/1976   COLONOSCOPY WITH PROPOFOL N/A 09/22/2016   Procedure: COLONOSCOPY WITH PROPOFOL;  Surgeon: Manya Silvas, MD;  Location: [facility] ENDOSCOPY;  Service: Endoscopy;  Laterality: N/A;   COLONOSCOPY WITH PROPOFOL N/A 02/15/2022   Procedure: COLONOSCOPY WITH PROPOFOL;  Surgeon: Lin Landsman, MD;  Location: [facility] ENDOSCOPY;  Service: Gastroenterology;  Laterality: N/A;   EP IMPLANTABLE DEVICE N/A 08/05/2015   Procedure: Loop Recorder Insertion;  Surgeon: Deboraha Sprang, MD;  Location: [place] [facility];  Service: Cardiovascular;  Laterality: N/A;   ESOPHAGOGASTRODUODENOSCOPY N/A 02/15/2022   Procedure: ESOPHAGOGASTRODUODENOSCOPY (EGD);  Surgeon: Lin Landsman, MD;  Location: [facility] ENDOSCOPY;  Service: Gastroenterology;  Laterality: N/A;   ESOPHAGOGASTRODUODENOSCOPY (EGD) WITH PROPOFOL N/A 09/22/2016   Procedure: ESOPHAGOGASTRODUODENOSCOPY (EGD) WITH PROPOFOL;  Surgeon: Manya Silvas, MD;  Location: [facility] ENDOSCOPY;  Service: Endoscopy;  Laterality: N/A;   ESOPHAGOGASTRODUODENOSCOPY (EGD) WITH PROPOFOL N/A 12/08/2016   Procedure: ESOPHAGOGASTRODUODENOSCOPY (EGD) WITH PROPOFOL;  Surgeon: Manya Silvas, MD;  Location: [facility] ENDOSCOPY;  Service: Endoscopy;  Laterality: N/A;   FRACTURE SURGERY     GIVENS CAPSULE STUDY N/A 04/19/2022   Procedure: GIVENS CAPSULE STUDY;  Surgeon: Lin Landsman, MD;  Location: [facility] ENDOSCOPY;  Service: Gastroenterology;  Laterality: N/A;   KNEE ARTHROSCOPY Left 02/09/2003   LOWER EXTREMITY ANGIOGRAPHY Left 08/12/2021   Procedure: Lower Extremity Angiography;  Surgeon: Katha Cabal, MD;  Location: [place] [facility];  Service: Cardiovascular;  Laterality: Left;   TEE WITHOUT CARDIOVERSION N/A 01/31/2015   Procedure: TRANSESOPHAGEAL ECHOCARDIOGRAM (TEE);  Surgeon: Lelon Perla, MD;  Location: [facility] ENDOSCOPY;  Service: Cardiovascular;  Laterality: N/A;   TEMPORARY DIALYSIS CATHETER N/A 08/14/2021   Procedure: TEMPORARY DIALYSIS CATHETER;  Surgeon: Katha Cabal, MD;  Location: [place] [facility];  Service: Cardiovascular;  Laterality: N/A;   TUBAL LIGATION  02/09/1976   ULNAR NERVE TRANSPOSITION  02/08/2006    Social History   Socioeconomic History   Marital status: Married    Spouse name: Not on file   Number of children: 2   Years of education: Not on file   Highest education level: Bachelor's degree (e.g., BA, AB, BS)  Occupational History   Occupation: disabled    Comment: retired  Tobacco Use   Smoking status: Some Days    Packs/day: 0.50    Years: 47.00    Additional pack years: 0.00    Total pack years: 23.50    Types: Cigarettes    Start date: 02/08/1974   Smokeless tobacco: Never   Tobacco comments:    Quit 01/18/2022  Vaping Use   Vaping Use: Never used  Substance and Sexual Activity   Alcohol use: No    Alcohol/week: 0.0 standard drinks of alcohol   Drug use: No   Sexual activity: Yes    Birth control/protection: None  Other Topics Concern   Not on  file  Social History Narrative   Lives at home with stepson and husband, dogs and cats   Caffeine  Drinks sweet tea.   Right handed.    Social Determinants of Health   Financial Resource Strain: Low Risk  (12/11/2021)   Overall Financial Resource Strain (CARDIA)    Difficulty of Paying Living Expenses: Not very hard  Food Insecurity: No Food Insecurity (01/05/2022)   Hunger Vital Sign    Worried About Running Out of Food in the Last Year: Never true    Ran Out of Food in the Last Year: Never true  Transportation Needs: No Transportation Needs (01/05/2022)   PRAPARE - Hydrologist  (Medical): No    Lack of Transportation (Non-Medical): No  Physical Activity: Inactive (12/11/2021)   Exercise Vital Sign    Days of Exercise per Week: 0 days    Minutes of Exercise per Session: 0 min  Stress: Stress Concern Present (12/11/2021)   [place]    Feeling of Stress : To some extent  Social Connections: Moderately Isolated (12/11/2021)   Social Connection and Isolation Panel [NHANES]    Frequency of Communication with Friends and Family: More than three times a week    Frequency of Social Gatherings with Friends and Family: Once a week    Attends Religious Services: Never    Marine scientist or Organizations: No    Attends Archivist Meetings: Never    Marital Status: Married  Human resources officer Violence: Not At Risk (01/02/2022)   Humiliation, Afraid, Rape, and Kick questionnaire    Fear of Current or Ex-Partner: No    Emotionally Abused: No    Physically Abused: No    Sexually Abused: No    Family History  Problem Relation Age of Onset   Heart disease Mother        died from CHF   Asthma Mother    Diabetes Mother    Heart disease Father    Aneurysm Father    COPD Brother    Diabetes Brother    Alcohol abuse Paternal Aunt    Cancer Maternal Grandmother        unknownorigin   Anemia Neg Hx    Arrhythmia Neg Hx    Clotting disorder Neg Hx    Fainting Neg Hx    Heart attack Neg Hx    Heart failure Neg Hx    Hyperlipidemia Neg Hx    Hypertension Neg Hx      Current Outpatient Medications:    albuterol (VENTOLIN HFA) 108 (90 Base) MCG/ACT inhaler, TAKE 2 PUFFS BY MOUTH EVERY 6 HOURS AS NEEDED FOR WHEEZE OR SHORTNESS OF BREATH (Patient taking differently: Inhale 2 puffs into the lungs every 6 (six) hours as needed for wheezing or shortness of breath.), Disp: 8.5 each, Rfl: 2   apixaban (ELIQUIS) 5 MG TABS tablet, Take 1 tablet (5 mg total) by mouth 2 (two) times daily., Disp: 180  tablet, Rfl: 2   atorvastatin (LIPITOR) 40 MG tablet, Take 1 tablet (40 mg total) by mouth daily., Disp: 90 tablet, Rfl: 3   buPROPion (WELLBUTRIN SR) 150 MG 12 hr tablet, Take 1 tablet (150 mg total) by mouth 2 (two) times daily., Disp: 180 tablet, Rfl: 1   calcitRIOL (ROCALTROL) 0.25 MCG capsule, Take 0.25 mcg by mouth daily., Disp: , Rfl:    carvedilol (COREG) 25 MG tablet, Take 1 tablet (25 mg total) by  mouth 2 (two) times daily with a meal., Disp: 60 tablet, Rfl: 0   clopidogrel (PLAVIX) 75 MG tablet, Take 1 tablet (75 mg total) by mouth daily., Disp: 90 tablet, Rfl: 3   Dulaglutide 3 MG/0.5ML SOPN, Inject into the skin once a week., Disp: , Rfl:    fluticasone (FLONASE) 50 MCG/ACT nasal spray, Place 2 sprays into both nostrils daily., Disp: 16 g, Rfl: 6   gabapentin (NEURONTIN) 300 MG capsule, Take 300 mg by mouth 2 (two) times daily., Disp: , Rfl:    glucose blood test strip, OneTouch Verio strips, Disp: , Rfl:    hydrALAZINE (APRESOLINE) 25 MG tablet, Take 1 tablet (25 mg total) by mouth 2 (two) times daily., Disp: 270 tablet, Rfl: 3   insulin aspart (NOVOLOG) 100 UNIT/ML injection, Insulin pump, Disp: , Rfl:    Insulin Disposable Pump (OMNIPOD DASH PODS, GEN 4,) MISC, USE 1 POD EACH EVERY 72    HOURS, Disp: , Rfl:    Insulin Human (INSULIN PUMP) SOLN, Per endocrinoloy, Disp: , Rfl:    JARDIANCE 10 MG TABS tablet, Take 10 mg by mouth daily., Disp: , Rfl:    loratadine (CLARITIN) 10 MG tablet, Take 1 tablet by mouth daily., Disp: , Rfl:    meclizine (ANTIVERT) 12.5 MG tablet, Take 1 tablet (12.5 mg total) by mouth 3 (three) times daily as needed for dizziness., Disp: 30 tablet, Rfl: 0   OXYGEN, Place 2 L into the nose continuous., Disp: , Rfl:    PARoxetine (PAXIL) 40 MG tablet, Take 1 tablet (40 mg total) by mouth daily., Disp: 90 tablet, Rfl: 1   sacubitril-valsartan (ENTRESTO) 24-26 MG, Take 1 tablet by mouth 2 (two) times daily., Disp: 60 tablet, Rfl: 3   Spacer/Aero-Holding Chambers  (OPTICHAMBER DIAMOND) MISC, Use with inhaler to ensure medication is delivered throughout lungs, Disp: 1 each, Rfl: 0   torsemide (DEMADEX) 20 MG tablet, TAKE 3 TABLETS BY MOUTH EVERY DAY, Disp: 270 tablet, Rfl: 1   Vitamin D, Ergocalciferol, (DRISDOL) 1.25 MG (50000 UNIT) CAPS capsule, Take 50,000 Units by mouth every 7 (seven) days., Disp: , Rfl:    Fluticasone-Umeclidin-Vilant (TRELEGY ELLIPTA) 100-62.5-25 MCG/ACT AEPB, Inhale 1 puff into the lungs daily. (Patient not taking: Reported on 05/05/2022), Disp: 28 each, Rfl: 11   Fluticasone-Umeclidin-Vilant (TRELEGY ELLIPTA) 100-62.5-25 MCG/ACT AEPB, Inhale 1 puff into the lungs daily. (Patient not taking: Reported on 05/05/2022), Disp: 14 each, Rfl: 0   hydrochlorothiazide (HYDRODIURIL) 25 MG tablet, Take 1 tablet by mouth daily. (Patient not taking: Reported on 05/05/2022), Disp: , Rfl:    pantoprazole (PROTONIX) 40 MG tablet, Take 1 tablet (40 mg total) by mouth 2 (two) times daily. (Patient not taking: Reported on 05/05/2022), Disp: 60 tablet, Rfl: 0  Physical exam: There were no vitals filed for this visit. Physical Exam Cardiovascular:     Rate and Rhythm: Normal rate and regular rhythm.     Heart sounds: Normal heart sounds.  Pulmonary:     Effort: Pulmonary effort is normal.     Breath sounds: Normal breath sounds.  Abdominal:     General: Bowel sounds are normal.     Palpations: Abdomen is soft.  Skin:    General: Skin is warm and dry.  Neurological:     Mental Status: She is alert and oriented to person, place, and time.         Latest Ref Rng & Units 02/26/2022    4:25 PM  CMP  Glucose 70 - 99 mg/dL 299  BUN 8 - 23 mg/dL 52   Creatinine 0.44 - 1.00 mg/dL 3.27   Sodium 135 - 145 mmol/L 135   Potassium 3.5 - 5.1 mmol/L 5.0   Chloride 98 - 111 mmol/L 102   CO2 22 - 32 mmol/L 26   Calcium 8.9 - 10.3 mg/dL 9.0       Latest Ref Rng & Units 03/31/2022   10:27 AM  CBC  WBC 4.0 - 10.5 K/uL 10.3   Hemoglobin 12.0 - 15.0 g/dL  10.3   Hematocrit 36.0 - 46.0 % 35.7   Platelets 150 - 400 K/uL 550     No images are attached to the encounter.  VAS [place] ABI WITH/WO TBI  Result Date: 05/03/2022  LOWER EXTREMITY DOPPLER STUDY Patient Name:  GRAISON MACZKO  Date of Exam:   04/21/2022 Medical Rec #: [id number]       Accession #:    [id number] Date of Birth: 1955-06-29       Patient Gender: F Patient Age:   17 years Exam Location:  [facility] Procedure:      VAS [place] ABI WITH/WO TBI Referring Phys: [name] --------------------------------------------------------------------------------  Indications: Rest pain, and ulceration. High Risk Factors: Hypertension, hyperlipidemia.  Vascular Interventions: 08/14/2021: Insertion of Temporary dialysis catheter                         Right Femoral Approach. Comparison Study: 01/14/2022 Performing Technologist: Almira Coaster RVS  Examination Guidelines: A complete evaluation includes at minimum, Doppler waveform signals and systolic blood pressure reading at the level of bilateral brachial, anterior tibial, and posterior tibial arteries, when vessel segments are accessible. Bilateral testing is considered an integral part of a complete examination. Photoelectric Plethysmograph (PPG) waveforms and toe systolic pressure readings are included as required and additional duplex testing as needed. Limited examinations for reoccurring indications may be performed as noted.  ABI Findings: +---------+------------------+-----+--------+--------+ Right    Rt Pressure (mmHg)IndexWaveformComment  +---------+------------------+-----+--------+--------+ Brachial 165                                     +---------+------------------+-----+--------+--------+ ATA      127               0.75 biphasic         +---------+------------------+-----+--------+--------+ PTA      125               0.74 biphasic         +---------+------------------+-----+--------+--------+ Great Toe105                0.62 Abnormal         +---------+------------------+-----+--------+--------+ +---------+------------------+-----+---------+-------+ Left     Lt Pressure (mmHg)IndexWaveform Comment +---------+------------------+-----+---------+-------+ Brachial 170                                     +---------+------------------+-----+---------+-------+ ATA      203               1.19 triphasic        +---------+------------------+-----+---------+-------+ PTA      182               1.07 biphasic         +---------+------------------+-----+---------+-------+ Almyra Brace  1.01 Normal           +---------+------------------+-----+---------+-------+ +-------+-----------+-----------+------------+------------+ ABI/TBIToday's ABIToday's TBIPrevious ABIPrevious TBI +-------+-----------+-----------+------------+------------+ Right  .75        .62        .74         .39          +-------+-----------+-----------+------------+------------+ Left   1.19       1.01       1.11        1.06         +-------+-----------+-----------+------------+------------+  Bilateral ABIs appear essentially unchanged compared to prior study on 01/14/2022. Left TBIs appear essentially unchanged compared to prior study on 01/14/2022. Right TBIs appear to be increased compared to prior study on 01/14/2022.  Summary: Right: Resting right ankle-brachial index indicates moderate right lower extremity arterial disease. The right toe-brachial index is abnormal. Left: Resting left ankle-brachial index is within normal range. The left toe-brachial index is normal. *See table(s) above for measurements and observations.  Electronically signed by [name] MD on 05/03/2022 at 4:16:48 PM.    Final    VAS [place] LOWER EXTREMITY ARTERIAL DUPLEX  Result Date: 05/03/2022 LOWER EXTREMITY ARTERIAL DUPLEX STUDY Patient Name:  MAHLI COONROD Esker  Date of Exam:   04/21/2022 Medical Rec #: [id number]        Accession #:    [id number] Date of Birth: 20-Sep-1955       Patient Gender: F Patient Age:   87 years Exam Location:  [facility] Procedure:      VAS [place] LOWER EXTREMITY ARTERIAL DUPLEX Referring Phys: Belenda Cruise SCHNIER --------------------------------------------------------------------------------  Indications: Increased pain in left calf wound.  Vascular Interventions: 7/2023Left SFA Pop and TP trunk thrombectomy. Current ABI:            Rt .75, Lt 1.19 Comparison Study: 09/22/2021 Performing Technologist: Almira Coaster RVS  Examination Guidelines: A complete evaluation includes B-mode imaging, spectral Doppler, color Doppler, and power Doppler as needed of all accessible portions of each vessel. Bilateral testing is considered an integral part of a complete examination. Limited examinations for reoccurring indications may be performed as noted.   +-----------+--------+-----+--------+----------+--------+ LEFT       PSV cm/sRatioStenosisWaveform  Comments +-----------+--------+-----+--------+----------+--------+ CFA Distal 192                  triphasic          +-----------+--------+-----+--------+----------+--------+ DFA        57                   biphasic           +-----------+--------+-----+--------+----------+--------+ SFA Prox   132                  triphasic          +-----------+--------+-----+--------+----------+--------+ SFA Mid    117                  triphasic          +-----------+--------+-----+--------+----------+--------+ SFA Distal 133                  triphasic          +-----------+--------+-----+--------+----------+--------+ POP Distal 163                  triphasic          +-----------+--------+-----+--------+----------+--------+ ATA Distal 120  triphasic          +-----------+--------+-----+--------+----------+--------+ PTA Distal 33                   biphasic            +-----------+--------+-----+--------+----------+--------+ PERO Distal21                   monophasic         +-----------+--------+-----+--------+----------+--------+  Summary: Left: Imaging and Waveforms obtained throughout in the Left Lower Extremity. Triphasic Waveforms seen predominantly in the Left Lower Extremity.  See table(s) above for measurements and observations. Electronically signed by [name] MD on 05/03/2022 at 4:16:23 PM.    Final      Assessment and plan- Patient is a 67 y.o. female who is here for follow-up of following issues:  Anemia likely secondary to chronic kidney disease as well as iron deficiencyC: Patient's hemoglobin was around 11 last year and has slowly drifted down to the tens this year.  He had even gone down to 7.5 when there was evidence of iron deficiency and after receiving IV iron presently her hemoglobin is 10.9.  Iron studies from today are pending.  Given that her hemoglobin is between 10-11 I am not initiating any EPO at this time.  Discussed risks and benefits of EPO including all but not limited to possible risk of thromboembolic events but can be used for anemia of chronic kidney disease.  Thrombocytosis: Patient has had a platelet count fluctuating between 500s to 600s since December 2023.Prior to that mostly her platelets count were in the 400s.  I will proceed with thrombocytosis workup in 2 months including JAK2, CALR and MPL mutation testing.  I will see her back in 2 months with labs prior   Visit Diagnosis 1. Anemia in stage 4 chronic kidney disease   2. Leukocytosis, unspecified type   3. Thrombocytosis      Dr. Randa Evens, MD, MPH [facility] [id number] 05/10/2022 9:40 AM

## 2022-05-11 ENCOUNTER — Encounter: Payer: Self-pay | Admitting: Oncology

## 2022-05-11 ENCOUNTER — Encounter: Payer: Self-pay | Admitting: Student in an Organized Health Care Education/Training Program

## 2022-05-11 ENCOUNTER — Ambulatory Visit
Payer: Medicare Other | Attending: Student in an Organized Health Care Education/Training Program | Admitting: Student in an Organized Health Care Education/Training Program

## 2022-05-11 VITALS — BP 141/64 | HR 75 | Temp 97.1°F | Resp 18 | Ht 66.0 in | Wt 208.0 lb

## 2022-05-11 DIAGNOSIS — N184 Chronic kidney disease, stage 4 (severe): Secondary | ICD-10-CM | POA: Diagnosis not present

## 2022-05-11 DIAGNOSIS — S32010S Wedge compression fracture of first lumbar vertebra, sequela: Secondary | ICD-10-CM | POA: Diagnosis not present

## 2022-05-11 DIAGNOSIS — Z794 Long term (current) use of insulin: Secondary | ICD-10-CM | POA: Diagnosis not present

## 2022-05-11 DIAGNOSIS — S22000S Wedge compression fracture of unspecified thoracic vertebra, sequela: Secondary | ICD-10-CM

## 2022-05-11 DIAGNOSIS — M48062 Spinal stenosis, lumbar region with neurogenic claudication: Secondary | ICD-10-CM

## 2022-05-11 DIAGNOSIS — Z7985 Long-term (current) use of injectable non-insulin antidiabetic drugs: Secondary | ICD-10-CM

## 2022-05-11 DIAGNOSIS — I63033 Cerebral infarction due to thrombosis of bilateral carotid arteries: Secondary | ICD-10-CM | POA: Diagnosis not present

## 2022-05-11 DIAGNOSIS — M48061 Spinal stenosis, lumbar region without neurogenic claudication: Secondary | ICD-10-CM | POA: Diagnosis not present

## 2022-05-11 DIAGNOSIS — I5032 Chronic diastolic (congestive) heart failure: Secondary | ICD-10-CM | POA: Diagnosis not present

## 2022-05-11 DIAGNOSIS — G894 Chronic pain syndrome: Secondary | ICD-10-CM | POA: Diagnosis not present

## 2022-05-11 DIAGNOSIS — S22000A Wedge compression fracture of unspecified thoracic vertebra, initial encounter for closed fracture: Secondary | ICD-10-CM | POA: Insufficient documentation

## 2022-05-11 DIAGNOSIS — E1140 Type 2 diabetes mellitus with diabetic neuropathy, unspecified: Secondary | ICD-10-CM | POA: Diagnosis not present

## 2022-05-11 DIAGNOSIS — E1159 Type 2 diabetes mellitus with other circulatory complications: Secondary | ICD-10-CM | POA: Diagnosis not present

## 2022-05-11 DIAGNOSIS — S81801D Unspecified open wound, right lower leg, subsequent encounter: Secondary | ICD-10-CM | POA: Diagnosis not present

## 2022-05-11 DIAGNOSIS — E1122 Type 2 diabetes mellitus with diabetic chronic kidney disease: Secondary | ICD-10-CM | POA: Diagnosis not present

## 2022-05-11 DIAGNOSIS — M549 Dorsalgia, unspecified: Secondary | ICD-10-CM

## 2022-05-11 DIAGNOSIS — I152 Hypertension secondary to endocrine disorders: Secondary | ICD-10-CM | POA: Diagnosis not present

## 2022-05-11 LAB — ERYTHROPOIETIN: Erythropoietin: 4.2 m[IU]/mL (ref 2.6–18.5)

## 2022-05-11 MED ORDER — TRAMADOL HCL 50 MG PO TABS
50.0000 mg | ORAL_TABLET | Freq: Every day | ORAL | 2 refills | Status: DC | PRN
Start: 2022-05-11 — End: 2022-07-27

## 2022-05-11 NOTE — Progress Notes (Addendum)
PROVIDER NOTE: Information contained herein reflects review and annotations entered in association with encounter. Interpretation of such information and data should be left to medically-trained personnel. Information provided to patient can be located elsewhere in the medical record under "Patient Instructions". Document created using STT-dictation technology, any transcriptional errors that may result from process are unintentional.    Patient: Jocelyn Wells  Service Category: E/M  Provider: Gillis Santa, MD  DOB: 07-11-1955  DOS: 05/11/2022  Specialty: Interventional Pain Management  MRN: [id number]  Setting: Ambulatory outpatient  PCP: Gwyneth Sprout, FNP  Type: Established Patient    Referring Provider: Gwyneth Sprout, FNP  Location: Office  Delivery: Face-to-face     HPI  Jocelyn Wells, a 67 y.o. year old female, is here today because of her Chronic painful diabetic neuropathy [E11.40]. Jocelyn Wells primary complain today is Back Pain  Last encounter: My last encounter with her was on 11/03/21 Pertinent problems: Jocelyn Wells has H/O: osteoarthritis; Anxiety, generalized; Arthritis, degenerative; Morbid obesity due to excess calories; Compression fracture of L1 lumbar vertebra; Compression fracture of body of thoracic vertebra; Lumbar foraminal stenosis (RIGHT L1); and Spinal stenosis of lumbar region with neurogenic claudication (L4-L5) on their pertinent problem list. Pain Assessment: Severity of Chronic pain is reported as a 5 /10. Location: Back Right/to right knee. Onset: More than a month ago. Quality: Aching. Timing: Constant. Modifying factor(s): meds, sitting. Vitals:  height is 5\' 6"  (1.676 m) and weight is 208 lb (94.3 kg). Her temperature is 97.1 F (36.2 C) (abnormal). Her blood pressure is 141/64 (abnormal) and her pulse is 75. Her respiration is 18 and oxygen saturation is 94%.   Reason for encounter: medication management.    Wells presents today for medication management.   Unfortunately she sustained a fall last week.  She has a bandage/wrap on her right knee and right foot. History of falls and falls weekly she says. Discussed Qutenza for PDN and patient would like to trial  Pharmacotherapy Assessment  Analgesic: Tramadol 50 mg twice daily as needed, quantity 60/month    Monitoring:  PMP: PDMP reviewed during this encounter.       Pharmacotherapy: No side-effects or adverse reactions reported. Compliance: No problems identified. Effectiveness: Clinically acceptable.  UDS:  Summary  Date Value Ref Range Status  11/03/2021 Note  Final    Comment:    ==================================================================== [employer] Select 13 (MW) ==================================================================== Test                             Result       Flag       Units  Drug Present and Declared for Prescription Verification   Tramadol                       8985         EXPECTED   ng/mg creat   O-Desmethyltramadol            5164         EXPECTED   ng/mg creat   N-Desmethyltramadol            2718         EXPECTED   ng/mg creat    Source of tramadol is a prescription medication. O-desmethyltramadol    and N-desmethyltramadol are expected metabolites of tramadol.  ==================================================================== Test  Result    Flag   Units      Ref Range   Creatinine              33               mg/dL      >=20 ==================================================================== Declared Medications:  The flagging and interpretation on this report are based on the  following declared medications.  Unexpected results may arise from  inaccuracies in the declared medications.   **Note: The testing scope of this panel includes these medications:   Tramadol (Ultram)   **Note: The testing scope of this panel does not include the  following reported medications:   Albuterol (Ventolin HFA)  Apixaban  (Eliquis)  Atorvastatin (Lipitor)  Carvedilol (Coreg)  Clopidogrel (Plavix)  Dulaglutide  Fluticasone (Flonase)  Gabapentin (Neurontin)  Insulin (NovoLog)  Loratadine (Claritin)  Losartan (Cozaar)  Pantoprazole (Protonix)  Paroxetine (Paxil)  Sulfadiazine (Silvadene)  Torsemide (Demadex) ==================================================================== For clinical consultation, please call [phone]. ====================================================================       ROS  Constitutional: Denies any fever or chills Gastrointestinal: No reported hemesis, hematochezia, vomiting, or acute GI distress Musculoskeletal:  Mid back, low back pain, right knee and right leg pain Neurological: No reported episodes of acute onset apraxia, aphasia, dysarthria, agnosia, amnesia, paralysis, loss of coordination, or loss of consciousness  Medication Review  Dulaglutide, Omnipod DASH Pods (Gen 4), Oxygen-Helium, PARoxetine, Vitamin D (Ergocalciferol), albuterol, apixaban, atorvastatin, buPROPion, calcitRIOL, carvedilol, clopidogrel, empagliflozin, fluticasone, gabapentin, glucose blood, hydrALAZINE, insulin aspart, insulin pump, loratadine, meclizine, optichamber diamond, sacubitril-valsartan, torsemide, and traMADol  History Review  Allergy: Jocelyn Wells is allergic to codeine and ivp dye [iodinated contrast media]. Drug: Jocelyn Wells  reports no history of drug use. Alcohol:  reports no history of alcohol use. Tobacco:  reports that she has been smoking cigarettes. She started smoking about 48 years ago. She has a 23.50 pack-year smoking history. She has never used smokeless tobacco. Social: Jocelyn Wells  reports that she has been smoking cigarettes. She started smoking about 48 years ago. She has a 23.50 pack-year smoking history. She has never used smokeless tobacco. She reports that she does not drink alcohol and does not use drugs. Medical:  has a past medical history of Anemia,  Anxiety, Cardiomyopathy, CHF (congestive heart failure), Chronic kidney disease, COPD (chronic obstructive pulmonary disease), Depression, Diabetes mellitus, type II, HTN (hypertension), Leukocytosis, Obesity, Osteoporosis, PONV (postoperative nausea and vomiting), and Stroke. Surgical: Jocelyn Wells  has a past surgical history that includes Abdominal hysterectomy; Tubal ligation (02/09/1976); Cesarean section (02/09/1976); Knee arthroscopy (Left, 02/09/2003); Ulnar nerve transposition (02/08/2006); Bilateral salpingoophorectomy (02/08/1998); Ankle fracture surgery (Left, 02/09/2000); TEE without cardioversion (N/A, 01/31/2015); Cardiac catheterization (N/A, 03/25/2015); Cardiac catheterization (N/A, 08/05/2015); Esophagogastroduodenoscopy (egd) with propofol (N/A, 09/22/2016); Colonoscopy with propofol (N/A, 09/22/2016); Esophagogastroduodenoscopy (egd) with propofol (N/A, 12/08/2016); Lower Extremity Angiography (Left, 08/12/2021); TEMPORARY DIALYSIS CATHETER (N/A, 08/14/2021); Fracture surgery; Colonoscopy with propofol (N/A, 02/15/2022); Esophagogastroduodenoscopy (N/A, 02/15/2022); and Givens capsule study (N/A, 04/19/2022). Family: family history includes Alcohol abuse in her paternal aunt; Aneurysm in her father; Asthma in her mother; COPD in her brother; Cancer in her maternal grandmother; Diabetes in her brother and mother; Heart disease in her father and mother.  Laboratory Chemistry Profile   Renal Lab Results  Component Value Date   BUN 52 (H) 02/26/2022   CREATININE 3.27 (H) 02/26/2022   BCR 15 12/04/2021   GFRAA 33 (L) 04/23/2019   GFRNONAA 15 (L) 02/26/2022     Hepatic Lab Results  Component Value Date   AST 13 (L) 12/15/2021   ALT 10 12/15/2021   ALBUMIN 3.1 (L) 12/15/2021   ALKPHOS 81 12/15/2021     Electrolytes Lab Results  Component Value Date   NA 135 02/26/2022   K 5.0 02/26/2022   CL 102 02/26/2022   CALCIUM 9.0 02/26/2022   MG 2.0 01/04/2022   PHOS 3.8 08/20/2021      Bone No results found for: "VD25OH", "VD125OH2TOT", "[id number]", "[id number]", "25OHVITD1", "25OHVITD2", "25OHVITD3", "TESTOFREE", "TESTOSTERONE"   Inflammation (CRP: Acute Phase) (ESR: Chronic Phase) Lab Results  Component Value Date   ESRSEDRATE 23 10/20/2020   LATICACIDVEN 1.4 05/31/2020       Note: Above Lab results reviewed.  Recent Imaging Review  VAS [place] ABI WITH/WO TBI  LOWER EXTREMITY DOPPLER STUDY  Patient Name:  Jocelyn Wells  Date of Exam:   04/21/2022 Medical Rec #: [id number]       Accession #:    [id number] Date of Birth: 17-May-1955       Patient Gender: F Patient Age:   50 years Exam Location:  [facility] Procedure:      VAS [place] ABI WITH/WO TBI Referring Phys: [name]  --------------------------------------------------------------------------------   Indications: Rest pain, and ulceration.  High Risk Factors: Hypertension, hyperlipidemia.   Vascular Interventions: 08/14/2021: Insertion of Temporary dialysis catheter                         Right Femoral Approach.  Comparison Study: 01/14/2022  Performing Technologist: Almira Coaster RVS    Examination Guidelines: A complete evaluation includes at minimum, Doppler waveform signals and systolic blood pressure reading at the level of bilateral brachial, anterior tibial, and posterior tibial arteries, when vessel segments are accessible. Bilateral testing is considered an integral part of a complete examination. Photoelectric Plethysmograph (PPG) waveforms and toe systolic pressure readings are included as required and additional duplex testing as needed. Limited examinations for reoccurring indications may be performed as noted.    ABI Findings: +---------+------------------+-----+--------+--------+ Right    Rt Pressure (mmHg)IndexWaveformComment  +---------+------------------+-----+--------+--------+ Brachial 165                                      +---------+------------------+-----+--------+--------+ ATA      127               0.75 biphasic         +---------+------------------+-----+--------+--------+ PTA      125               0.74 biphasic         +---------+------------------+-----+--------+--------+ Great Toe105               0.62 Abnormal         +---------+------------------+-----+--------+--------+  +---------+------------------+-----+---------+-------+ Left     Lt Pressure (mmHg)IndexWaveform Comment +---------+------------------+-----+---------+-------+ Brachial 170                                     +---------+------------------+-----+---------+-------+ ATA      203               1.19 triphasic        +---------+------------------+-----+---------+-------+ PTA      182               1.07 biphasic         +---------+------------------+-----+---------+-------+  Great Toe172               1.01 Normal           +---------+------------------+-----+---------+-------+  +-------+-----------+-----------+------------+------------+ ABI/TBIToday's ABIToday's TBIPrevious ABIPrevious TBI +-------+-----------+-----------+------------+------------+ Right  .75        .62        .74         .39          +-------+-----------+-----------+------------+------------+ Left   1.19       1.01       1.11        1.06         +-------+-----------+-----------+------------+------------+     Bilateral ABIs appear essentially unchanged compared to prior study on 01/14/2022. Left TBIs appear essentially unchanged compared to prior study on 01/14/2022. Right TBIs appear to be increased compared to prior study on 01/14/2022.   Summary: Right: Resting right ankle-brachial index indicates moderate right lower extremity arterial disease. The right toe-brachial index is abnormal.  Left: Resting left ankle-brachial index is within normal range. The left toe-brachial index is  normal.  *See table(s) above for measurements and observations.    Electronically signed by [name] MD on 05/03/2022 at 4:16:48 PM.      Final   VAS [place] LOWER EXTREMITY ARTERIAL DUPLEX LOWER EXTREMITY ARTERIAL DUPLEX STUDY  Patient Name:  LINDORA MACDERMOTT Overbeck  Date of Exam:   04/21/2022 Medical Rec #: [id number]       Accession #:    [id number] Date of Birth: 04-13-1955       Patient Gender: F Patient Age:   48 years Exam Location:  [facility] Procedure:      VAS [place] LOWER EXTREMITY ARTERIAL DUPLEX Referring Phys: Belenda Cruise SCHNIER  --------------------------------------------------------------------------------   Indications: Increased pain in left calf wound.   Vascular Interventions: 7/2023Left SFA Pop and TP trunk thrombectomy. Current ABI:            Rt .75, Lt 1.19  Comparison Study: 09/22/2021  Performing Technologist: Almira Coaster RVS    Examination Guidelines: A complete evaluation includes B-mode imaging, spectral Doppler, color Doppler, and power Doppler as needed of all accessible portions of each vessel. Bilateral testing is considered an integral part of a complete examination. Limited examinations for reoccurring indications may be performed as noted.        +-----------+--------+-----+--------+----------+--------+ LEFT       PSV cm/sRatioStenosisWaveform  Comments +-----------+--------+-----+--------+----------+--------+ CFA Distal 192                  triphasic          +-----------+--------+-----+--------+----------+--------+ DFA        57                   biphasic           +-----------+--------+-----+--------+----------+--------+ SFA Prox   132                  triphasic          +-----------+--------+-----+--------+----------+--------+ SFA Mid    117                  triphasic          +-----------+--------+-----+--------+----------+--------+ SFA Distal 133                  triphasic           +-----------+--------+-----+--------+----------+--------+ POP Distal 163  triphasic          +-----------+--------+-----+--------+----------+--------+ ATA Distal 120                  triphasic          +-----------+--------+-----+--------+----------+--------+ PTA Distal 33                   biphasic           +-----------+--------+-----+--------+----------+--------+ PERO Distal21                   monophasic         +-----------+--------+-----+--------+----------+--------+     Summary: Left: Imaging and Waveforms obtained throughout in the Left Lower Extremity. Triphasic Waveforms seen predominantly in the Left Lower Extremity.    See table(s) above for measurements and observations.  Electronically signed by [name] MD on 05/03/2022 at 4:16:23 PM.       Final    Note: Reviewed        Physical Exam  General appearance: Well nourished, well developed, and well hydrated. In no apparent acute distress Mental status: Alert, oriented x 3 (person, place, & time)       Respiratory: No evidence of acute respiratory distress Eyes: PERLA Vitals: BP (!) 141/64   Pulse 75   Temp (!) 97.1 F (36.2 C)   Resp 18   Ht 5\' 6"  (1.676 m)   Wt 208 lb (94.3 kg)   SpO2 94%   BMI 33.57 kg/m  BMI: Estimated body mass index is 33.57 kg/m as calculated from the following:   Height as of this encounter: 5\' 6"  (1.676 m).   Weight as of this encounter: 208 lb (94.3 kg). Ideal: Ideal body weight: 59.3 kg (130 lb 11.7 oz) Adjusted ideal body weight: 73.3 kg (161 lb 10.2 oz)   Lumbar Spine Area Exam  Skin & Axial Inspection: No masses, redness, or swelling Alignment: Symmetrical Functional ROM: Pain restricted ROM affecting both sides Stability: No instability detected Muscle Tone/Strength: Functionally intact. No obvious neuro-muscular anomalies detected. Sensory (Neurological): Dermatomal pain pattern and neurogenic    Gait & Posture  Assessment  Ambulation: Patient ambulates using a cane Gait: Significantly limited. Dependent on assistive device to ambulate Posture: Difficulty standing up straight, due to pain  Lower Extremity Exam      Side: Right lower extremity   Side: Left lower extremity  Stability: No instability observed           Stability: No instability observed          Skin & Extremity Inspection: Skin color, temperature, and hair growth are WNL. No peripheral edema or cyanosis. No masses, redness, swelling, asymmetry, or associated skin lesions. No contractures.   Skin & Extremity Inspection: Bandage, wrap in place  Functional ROM: Pain restricted ROM for hip and knee joints           Functional ROM: Pain restricted ROM for hip and knee joints          Muscle Tone/Strength: Functionally intact. No obvious neuro-muscular anomalies detected.   Muscle Tone/Strength: Functionally intact. No obvious neuro-muscular anomalies detected.  Sensory (Neurological): Musculoskeletal pain pattern         Sensory (Neurological): Musculoskeletal pain pattern        DTR: Patellar: deferred today Achilles: deferred today Plantar: deferred today   DTR: Patellar: deferred today Achilles: deferred today Plantar: deferred today  Palpation: No palpable anomalies   Palpation: No palpable anomalies     Assessment  Status Diagnosis  Persistent Persistent Persistent 1. Chronic painful diabetic neuropathy   2. Lumbar foraminal stenosis (RIGHT L1)   3. Spinal stenosis of lumbar region with neurogenic claudication (L4-L5)   4. Compression fracture of body of thoracic vertebra   5. Compression fracture of L1 vertebra, sequela   6. Cerebrovascular accident (CVA) due to bilateral thrombosis of carotid arteries   7. Chronic pain syndrome          Plan of Care   Jocelyn Wells has a current medication list which includes the following long-term medication(s): albuterol, apixaban, atorvastatin, bupropion, carvedilol,  fluticasone, hydralazine, insulin pump, loratadine, paroxetine, optichamber diamond, and torsemide.   Pharmacotherapy (Medications Ordered): Meds ordered this encounter  Medications   traMADol (ULTRAM) 50 MG tablet    Sig: Take 1 tablet (50 mg total) by mouth daily as needed.    Dispense:  30 tablet    Refill:  2    Discussed Qutenza for PDN Orders Placed This Encounter  Procedures   NEUROLYSIS    Please order Qutenza patches from pharmacy    Standing Status:   Future    Standing Expiration Date:   08/10/2022    Order Specific Question:   Where will this procedure be performed?    Answer:   [facility]      Follow-up plan:   Return in about 3 months (around 08/10/2022) for Medication Management, in person.   Recent Visits No visits were found meeting these conditions. Showing recent visits within past 90 days and meeting all other requirements Today's Visits Date Type Provider Dept  05/11/22 Office Visit Gillis Santa, MD [facility]  Showing today's visits and meeting all other requirements Future Appointments No visits were found meeting these conditions. Showing future appointments within next 90 days and meeting all other requirements  I discussed the assessment and treatment plan with the patient. The patient was provided an opportunity to ask questions and all were answered. The patient agreed with the plan and demonstrated an understanding of the instructions.  Patient advised to call back or seek an in-person evaluation if the symptoms or condition worsens.  Duration of encounter: 58minutes.  Note by: Gillis Santa, MD Date: 05/11/2022; Time: 1:18 PM

## 2022-05-11 NOTE — Progress Notes (Signed)
Nursing Pain Medication Assessment:  Safety precautions to be maintained throughout the outpatient stay will include: orient to surroundings, keep bed in low position, maintain call bell within reach at all times, provide assistance with transfer out of bed and ambulation.  Medication Inspection Compliance: Pill count conducted under aseptic conditions, in front of the patient. Neither the pills nor the bottle was removed from the patient's sight at any time. Once count was completed pills were immediately returned to the patient in their original bottle.  Medication: Tramadol (Ultram) Pill/Patch Count:  1 of 60 pills remain Pill/Patch Appearance: Markings consistent with prescribed medication Bottle Appearance: Standard pharmacy container. Clearly labeled. Filled Date: 03 / 24 / 2024 Last Medication intake:  Yesterday

## 2022-05-14 ENCOUNTER — Other Ambulatory Visit: Payer: Self-pay | Admitting: Oncology

## 2022-05-14 DIAGNOSIS — N184 Chronic kidney disease, stage 4 (severe): Secondary | ICD-10-CM | POA: Diagnosis not present

## 2022-05-14 DIAGNOSIS — I152 Hypertension secondary to endocrine disorders: Secondary | ICD-10-CM | POA: Diagnosis not present

## 2022-05-14 DIAGNOSIS — E1122 Type 2 diabetes mellitus with diabetic chronic kidney disease: Secondary | ICD-10-CM | POA: Diagnosis not present

## 2022-05-14 DIAGNOSIS — S81801D Unspecified open wound, right lower leg, subsequent encounter: Secondary | ICD-10-CM | POA: Diagnosis not present

## 2022-05-14 DIAGNOSIS — I5032 Chronic diastolic (congestive) heart failure: Secondary | ICD-10-CM | POA: Diagnosis not present

## 2022-05-14 DIAGNOSIS — E1159 Type 2 diabetes mellitus with other circulatory complications: Secondary | ICD-10-CM | POA: Diagnosis not present

## 2022-05-17 ENCOUNTER — Inpatient Hospital Stay: Payer: Medicare Other

## 2022-05-17 VITALS — BP 150/73 | HR 81 | Temp 97.9°F | Resp 16

## 2022-05-17 DIAGNOSIS — D508 Other iron deficiency anemias: Secondary | ICD-10-CM

## 2022-05-17 DIAGNOSIS — D509 Iron deficiency anemia, unspecified: Secondary | ICD-10-CM | POA: Diagnosis not present

## 2022-05-17 DIAGNOSIS — N184 Chronic kidney disease, stage 4 (severe): Secondary | ICD-10-CM | POA: Diagnosis not present

## 2022-05-17 DIAGNOSIS — D631 Anemia in chronic kidney disease: Secondary | ICD-10-CM | POA: Diagnosis not present

## 2022-05-17 DIAGNOSIS — D75839 Thrombocytosis, unspecified: Secondary | ICD-10-CM | POA: Diagnosis not present

## 2022-05-17 DIAGNOSIS — D72829 Elevated white blood cell count, unspecified: Secondary | ICD-10-CM | POA: Diagnosis not present

## 2022-05-17 DIAGNOSIS — Z79899 Other long term (current) drug therapy: Secondary | ICD-10-CM | POA: Diagnosis not present

## 2022-05-17 MED ORDER — SODIUM CHLORIDE 0.9 % IV SOLN
200.0000 mg | INTRAVENOUS | Status: DC
  Administered 2022-05-17: 200 mg via INTRAVENOUS
  Filled 2022-05-17: qty 200

## 2022-05-17 MED ORDER — SODIUM CHLORIDE 0.9 % IV SOLN
Freq: Once | INTRAVENOUS | Status: AC
  Filled 2022-05-17: qty 250

## 2022-05-17 NOTE — Progress Notes (Signed)
Pt has been educated and understands. Pt declined to stay 30 mins after iron infusion. BP elevated and other VS stable.

## 2022-05-19 ENCOUNTER — Telehealth (INDEPENDENT_AMBULATORY_CARE_PROVIDER_SITE_OTHER): Payer: Self-pay

## 2022-05-19 ENCOUNTER — Inpatient Hospital Stay: Payer: Medicare Other

## 2022-05-19 ENCOUNTER — Telehealth (INDEPENDENT_AMBULATORY_CARE_PROVIDER_SITE_OTHER): Payer: Medicare Other | Admitting: Licensed Clinical Social Worker

## 2022-05-19 VITALS — BP 117/58 | HR 78 | Temp 96.0°F | Resp 22

## 2022-05-19 DIAGNOSIS — F3341 Major depressive disorder, recurrent, in partial remission: Secondary | ICD-10-CM

## 2022-05-19 DIAGNOSIS — D75839 Thrombocytosis, unspecified: Secondary | ICD-10-CM | POA: Diagnosis not present

## 2022-05-19 DIAGNOSIS — Z79899 Other long term (current) drug therapy: Secondary | ICD-10-CM | POA: Diagnosis not present

## 2022-05-19 DIAGNOSIS — F4310 Post-traumatic stress disorder, unspecified: Secondary | ICD-10-CM

## 2022-05-19 DIAGNOSIS — N184 Chronic kidney disease, stage 4 (severe): Secondary | ICD-10-CM | POA: Diagnosis not present

## 2022-05-19 DIAGNOSIS — D509 Iron deficiency anemia, unspecified: Secondary | ICD-10-CM | POA: Diagnosis not present

## 2022-05-19 DIAGNOSIS — D508 Other iron deficiency anemias: Secondary | ICD-10-CM

## 2022-05-19 DIAGNOSIS — D631 Anemia in chronic kidney disease: Secondary | ICD-10-CM | POA: Diagnosis not present

## 2022-05-19 DIAGNOSIS — D72829 Elevated white blood cell count, unspecified: Secondary | ICD-10-CM | POA: Diagnosis not present

## 2022-05-19 MED ORDER — SODIUM CHLORIDE 0.9 % IV SOLN
200.0000 mg | INTRAVENOUS | Status: DC
  Administered 2022-05-19: 200 mg via INTRAVENOUS
  Filled 2022-05-19: qty 10

## 2022-05-19 MED ORDER — SODIUM CHLORIDE 0.9 % IV SOLN
Freq: Once | INTRAVENOUS | Status: AC
  Filled 2022-05-19: qty 250

## 2022-05-19 NOTE — Telephone Encounter (Signed)
I spoke with the patient earlier to schedule her for a left arm brachial cephalic fistula with Dr. Gilda Crease. [facility]. Pre-op is on 06/07/22 at 9:00 am at the [facility]. Pre-surgical instructions will be sent to Mychart and mailed.

## 2022-05-19 NOTE — Progress Notes (Signed)
THERAPIST PROGRESS NOTE  Session Time: 1:07PM-1:25PM  Participation Level: Active  Behavioral Response: Casual and Well GroomedAlertDepressed  Type of Therapy: Individual Therapy  Treatment Goals addressed:  Reduce overall frequency, intensity and duration of depression so that daily functioning is not impaired per pt self report 3 out of 5 sessions documented.   Reduce overall frequency, intensity and duration of anxiety so that daily functioning is not impaired per pt self report 3 out of 5 sessions.   Increase coping skills to manage depression and improve ability to perform daily activities  ProgressTowards Goals: Progressing  Interventions: CBT, DBT, Solution Focused, Strength-based, and Other: ACT  Summary: Jocelyn Wells is a 67 y.o. female who presents with mixed sxs of anxiety and depression related to a hx of trauma and current stressors. Sxs endorsed including but not limited to lack of motivation, fatigue, depressed mood, flashbacks, worry, difficulty controlling worry, irritability, and isolation. Pt oriented to person, place, and time. Pt denies SI/HI or A/V hallucinations. Pt was cooperative during visit and was engaged throughout the visit. Pt does not report any other concerns at the time of visit. This information has been reviewed and remains consistent with pt experience.   Pt reported feeling overall fine over the past two weeks. Pt reported that she has been experiencing sxs of anxiety due to interpersonal stress with husband. Pt reported that her husband is home and pt does not feel comfortable discussing these stressors at this time.   Pt reported improvements in practicing DBT distress tolerance skill of opposite action when facing debilitating sxs. Pt identified how they discerned need to utilize opposite action and explored ways they can and have practiced opposite action to assist in combating depressive sxs. Encouraged pt to continue practicing opposite  action skills to combat anxious and depressive sxs.   Pt reported working to stay socially engaged even when desiring to isolate. Encouraged pt to continue to practice these behaviors.   LCSW provided mood monitoring and treatment progress review in the context of this episode of treatment. LCSW reviewed the pt's mood status since last session.   Pt is continuing to apply interventions/techniques learned in session into daily life situations. Pt is currently on track to meet goals utilizing interventions that are discussed in session. Treatment to continue as indicated. Personal growth and progress toward goals noted above.  Continued Recommendations as followed: Self-care behaviors, positive social engagements, focusing on positive physical and emotional wellness, and focusing on life/work balance.    Virtual Visit via Video Note  I connected with Jocelyn Wells on 05/19/2022 at  1:00 PM EDT by a video enabled telemedicine application and verified that I am speaking with the correct person using two identifiers.  Location: Patient: located in pt's home Provider: [facility]   I discussed the limitations of evaluation and management by telemedicine and the availability of in person appointments. The patient expressed understanding and agreed to proceed.   I discussed the assessment and treatment plan with the patient. The patient was provided an opportunity to ask questions and all were answered. The patient agreed with the plan and demonstrated an understanding of the instructions.   The patient was advised to call back or seek an in-person evaluation if the symptoms worsen or if the condition fails to improve as anticipated.  I provided 18 minutes of non-face-to-face time during this encounter.   Geoffry Paradise, LCSW   Suicidal/Homicidal: Nowithout intent/plan  Therapist Response:  Provided pt education re: acceptance. Discussed  how to make acceptance accessible at all parts of pt's  healing journey.   Approached pt with strengths based perspective to assist pt in exploring strengths in moments of feeling low.   LCSW practiced active listening to validate pt participation, build rapport, and create safe space for pt to feel heard as they are disclosing their thoughts and feelings.   LCSW utilized therapeutic conversation skills informed by CBT, DBT, and ACT to expose pt to multiple ways of thinking about healing and to provide pt to access to multiple interventions.  Introduced pt to Dialectical Behavior Therapy and the importance of acceptance and change. Discussed concept of radical acceptance and also assisted pt in learning barriers to engaging in radical acceptance in journey towards change. Discussed importance of leaning into the dialectic and taught pt "and also" statements to assist in engaging with the bothness of change.   Introduced pt to "blue sky" qualities-things that are always true about self no matter what clouds are in the sky of life. Discussed how people identify with their temporary clouds as opposed to their consistent blue sky, because the clouds block their view of their true self. Invited pt to explore their blue sky qualities to identify areas where pt has not lost self and areas where pt has grown.   Plan: Return again in 2 weeks.  Diagnosis: PTSD (post-traumatic stress disorder)  MDD (major depressive disorder), recurrent, in partial remission     05/19/2022    1:17 PM 02/17/2022    2:53 PM 01/18/2022    3:11 PM  Depression screen PHQ 2/9  Decreased Interest 0 1 0  Down, Depressed, Hopeless 1 0 0  PHQ - 2 Score 1 1 0  Altered sleeping  1 3  Tired, decreased energy  1 3  Change in appetite  1 0  Feeling bad or failure about yourself   0 0  Trouble concentrating  0 0  Moving slowly or fidgety/restless  0 0  Suicidal thoughts  0 0  PHQ-9 Score  4 6  Difficult doing work/chores  Somewhat difficult Not difficult at all       05/19/2022     1:18 PM 02/17/2022    2:54 PM  GAD 7 : Generalized Anxiety Score  Nervous, Anxious, on Edge 1 1  Control/stop worrying 1 0  Worry too much - different things 1 1  Trouble relaxing 1 1  Restless 0 1  Easily annoyed or irritable 1 1  Afraid - awful might happen 1 1  Total GAD 7 Score 6 6  Anxiety Difficulty  Somewhat difficult      Collaboration of Care: Psychiatrist AEB Dr. Vanetta Shawl  Patient/Guardian was advised Release of Information must be obtained prior to any record release in order to collaborate their care with an outside provider. Patient/Guardian was advised if they have not already done so to contact the registration department to sign all necessary forms in order for [place] to release information regarding their care.   Consent: Patient/Guardian gives verbal consent for treatment and assignment of benefits for services provided during this visit. Patient/Guardian expressed understanding and agreed to proceed.   Geoffry Paradise, LCSW 05/19/2022

## 2022-05-19 NOTE — Patient Instructions (Signed)

## 2022-05-19 NOTE — Telephone Encounter (Signed)
I attempted to contact the patient to schedule a left arm brachial cephalic fistula with Dr. Gilda Crease. A message was left for a return call.

## 2022-05-21 ENCOUNTER — Inpatient Hospital Stay: Payer: Medicare Other

## 2022-05-21 MED FILL — Iron Sucrose Inj 20 MG/ML (Fe Equiv): INTRAVENOUS | Qty: 10 | Status: AC

## 2022-05-24 ENCOUNTER — Inpatient Hospital Stay: Payer: Medicare Other

## 2022-05-24 VITALS — BP 111/50 | HR 67 | Temp 97.9°F | Resp 20

## 2022-05-24 DIAGNOSIS — N184 Chronic kidney disease, stage 4 (severe): Secondary | ICD-10-CM | POA: Diagnosis not present

## 2022-05-24 DIAGNOSIS — D75839 Thrombocytosis, unspecified: Secondary | ICD-10-CM | POA: Diagnosis not present

## 2022-05-24 DIAGNOSIS — D72829 Elevated white blood cell count, unspecified: Secondary | ICD-10-CM | POA: Diagnosis not present

## 2022-05-24 DIAGNOSIS — D631 Anemia in chronic kidney disease: Secondary | ICD-10-CM | POA: Diagnosis not present

## 2022-05-24 DIAGNOSIS — D509 Iron deficiency anemia, unspecified: Secondary | ICD-10-CM | POA: Diagnosis not present

## 2022-05-24 DIAGNOSIS — D508 Other iron deficiency anemias: Secondary | ICD-10-CM

## 2022-05-24 DIAGNOSIS — Z79899 Other long term (current) drug therapy: Secondary | ICD-10-CM | POA: Diagnosis not present

## 2022-05-24 MED ORDER — SODIUM CHLORIDE 0.9 % IV SOLN
Freq: Once | INTRAVENOUS | Status: AC
  Filled 2022-05-24: qty 250

## 2022-05-24 MED ORDER — SODIUM CHLORIDE 0.9 % IV SOLN
INTRAVENOUS | Status: DC
  Filled 2022-05-24 (×2): qty 250

## 2022-05-24 MED ORDER — SODIUM CHLORIDE 0.9 % IV SOLN
200.0000 mg | INTRAVENOUS | Status: DC
  Administered 2022-05-24: 200 mg via INTRAVENOUS
  Filled 2022-05-24: qty 200

## 2022-05-24 NOTE — Progress Notes (Signed)
Pt presented to clinic for iron infusion today.  Pt c/o dizziness x 1 day, hypotension, and a fall yesterday at home.  Pt found to hypotensive on arrival (see flowsheet).  Dr Smith Robert notified.  Pt received 1 additional liter IVF today.  BP 111/50 post IVF.  Pt instructed to check BP prior to taking antihypertensives at home, and advised to hold medication if BP remains low.  Instructed pt to contact provider if hypotension persists.  Pt also instructed to push fluids at home.  Pt verbalized understanding.

## 2022-05-24 NOTE — Patient Instructions (Signed)

## 2022-05-31 ENCOUNTER — Telehealth: Payer: Self-pay

## 2022-05-31 NOTE — Progress Notes (Unsigned)
I,J'ya E Naylani Bradner,acting as a scribe for Jacky Kindle, FNP.,have documented all relevant documentation on the behalf of Jacky Kindle, FNP,as directed by  Jacky Kindle, FNP while in the presence of Jacky Kindle, FNP.   Complete physical exam  Patient: Jocelyn Wells   DOB: November 14, 1955   67 y.o. Female  MRN: [id number] Visit Date: 06/01/2022  Today's healthcare provider: Jacky Kindle, FNP  Re Introduced to nurse practitioner role and practice setting.  All questions answered.  Discussed provider/patient relationship and expectations.  Chief Complaint  Patient presents with   Annual Wellness Visit   Subjective    Jocelyn Wells is a 67 y.o. female who presents today for a complete physical exam.  She reports consuming a general and low sodium diet. The patient does not participate in regular exercise at present. She generally feels fairly well. She reports sleeping fairly well. She does have additional problems to discuss today.   Presents with edema in right leg HPI   Past Medical History:  Diagnosis Date   Anemia    Anxiety    Cardiomyopathy    new to her Jan 2017   CHF (congestive heart failure)    Chronic kidney disease    COPD (chronic obstructive pulmonary disease)    Depression    Diabetes mellitus, type II    HTN (hypertension)    Leukocytosis    Obesity    Osteoporosis    PONV (postoperative nausea and vomiting)    Stroke    Jan 2017   Past Surgical History:  Procedure Laterality Date   ABDOMINAL HYSTERECTOMY     ANKLE FRACTURE SURGERY Left 02/09/2000   BILATERAL SALPINGOOPHORECTOMY  02/08/1998   CARDIAC CATHETERIZATION N/A 03/25/2015   Procedure: Right/Left Heart Cath and Coronary Angiography;  Surgeon: Lyn Records, MD;  Location: [facility] INVASIVE CV LAB;  Service: Cardiovascular;  Laterality: N/A;   CESAREAN SECTION  02/09/1976   COLONOSCOPY WITH PROPOFOL N/A 09/22/2016   Procedure: COLONOSCOPY WITH PROPOFOL;  Surgeon: Scot Jun, MD;  Location: [facility]  ENDOSCOPY;  Service: Endoscopy;  Laterality: N/A;   COLONOSCOPY WITH PROPOFOL N/A 02/15/2022   Procedure: COLONOSCOPY WITH PROPOFOL;  Surgeon: Toney Reil, MD;  Location: [facility] ENDOSCOPY;  Service: Gastroenterology;  Laterality: N/A;   EP IMPLANTABLE DEVICE N/A 08/05/2015   Procedure: Loop Recorder Insertion;  Surgeon: Duke Salvia, MD;  Location: [facility] INVASIVE CV LAB;  Service: Cardiovascular;  Laterality: N/A;   ESOPHAGOGASTRODUODENOSCOPY N/A 02/15/2022   Procedure: ESOPHAGOGASTRODUODENOSCOPY (EGD);  Surgeon: Toney Reil, MD;  Location: [facility] ENDOSCOPY;  Service: Gastroenterology;  Laterality: N/A;   ESOPHAGOGASTRODUODENOSCOPY (EGD) WITH PROPOFOL N/A 09/22/2016   Procedure: ESOPHAGOGASTRODUODENOSCOPY (EGD) WITH PROPOFOL;  Surgeon: Scot Jun, MD;  Location: [facility] ENDOSCOPY;  Service: Endoscopy;  Laterality: N/A;   ESOPHAGOGASTRODUODENOSCOPY (EGD) WITH PROPOFOL N/A 12/08/2016   Procedure: ESOPHAGOGASTRODUODENOSCOPY (EGD) WITH PROPOFOL;  Surgeon: Scot Jun, MD;  Location: [facility] ENDOSCOPY;  Service: Endoscopy;  Laterality: N/A;   FRACTURE SURGERY     GIVENS CAPSULE STUDY N/A 04/19/2022   Procedure: GIVENS CAPSULE STUDY;  Surgeon: Toney Reil, MD;  Location: [facility] ENDOSCOPY;  Service: Gastroenterology;  Laterality: N/A;   KNEE ARTHROSCOPY Left 02/09/2003   LOWER EXTREMITY ANGIOGRAPHY Left 08/12/2021   Procedure: Lower Extremity Angiography;  Surgeon: Renford Dills, MD;  Location: [facility] INVASIVE CV LAB;  Service: Cardiovascular;  Laterality: Left;   TEE WITHOUT CARDIOVERSION N/A 01/31/2015   Procedure: TRANSESOPHAGEAL ECHOCARDIOGRAM (TEE);  Surgeon: Madolyn Frieze  Jens Som, MD;  Location: [facility] ENDOSCOPY;  Service: Cardiovascular;  Laterality: N/A;   TEMPORARY DIALYSIS CATHETER N/A 08/14/2021   Procedure: TEMPORARY DIALYSIS CATHETER;  Surgeon: Renford Dills, MD;  Location: [facility] INVASIVE CV LAB;  Service: Cardiovascular;  Laterality: N/A;   TUBAL LIGATION  02/09/1976    ULNAR NERVE TRANSPOSITION  02/08/2006   Social History   Socioeconomic History   Marital status: Married    Spouse name: Not on file   Number of children: 2   Years of education: Not on file   Highest education level: Bachelor's degree (e.g., BA, AB, BS)  Occupational History   Occupation: disabled    Comment: retired  Tobacco Use   Smoking status: Former    Packs/day: 0.50    Years: 47.00    Additional pack years: 0.00    Total pack years: 23.50    Types: Cigarettes    Start date: 02/08/1974    Quit date: 05/31/2022   Smokeless tobacco: Never  Vaping Use   Vaping Use: Never used  Substance and Sexual Activity   Alcohol use: No    Alcohol/week: 0.0 standard drinks of alcohol   Drug use: No   Sexual activity: Yes    Birth control/protection: None  Other Topics Concern   Not on file  Social History Narrative   Lives at home with stepson and husband, dogs and cats   Caffeine  Drinks sweet tea.   Right handed.    Social Determinants of Health   Financial Resource Strain: Low Risk  (12/11/2021)   Overall Financial Resource Strain (CARDIA)    Difficulty of Paying Living Expenses: Not very hard  Food Insecurity: No Food Insecurity (01/05/2022)   Hunger Vital Sign    Worried About Running Out of Food in the Last Year: Never true    Ran Out of Food in the Last Year: Never true  Transportation Needs: No Transportation Needs (01/05/2022)   PRAPARE - Administrator, Civil Service (Medical): No    Lack of Transportation (Non-Medical): No  Physical Activity: Inactive (06/01/2022)   Exercise Vital Sign    Days of Exercise per Week: 0 days    Minutes of Exercise per Session: 0 min  Stress: No Stress Concern Present (06/01/2022)   [employer] of Occupational Health - Occupational Stress Questionnaire    Feeling of Stress : Only a little  Social Connections: Moderately Isolated (12/11/2021)   Social Connection and Isolation Panel [NHANES]    Frequency of  Communication with Friends and Family: More than three times a week    Frequency of Social Gatherings with Friends and Family: Once a week    Attends Religious Services: Never    Database administrator or Organizations: No    Attends Banker Meetings: Never    Marital Status: Married  Catering manager Violence: Not At Risk (01/02/2022)   Humiliation, Afraid, Rape, and Kick questionnaire    Fear of Current or Ex-Partner: No    Emotionally Abused: No    Physically Abused: No    Sexually Abused: No   Family Status  Relation Name Status   Mother  Deceased   Father  Deceased   Brother 1 Deceased   Brother 2 Deceased   Oceanographer  (Not Specified)   MGM  (Not Specified)   MGF  Deceased   Neg Hx  (Not Specified)   Family History  Problem Relation Age of Onset   Heart disease Mother  died from CHF   Asthma Mother    Diabetes Mother    Heart disease Father    Aneurysm Father    COPD Brother    Diabetes Brother    Alcohol abuse Paternal Aunt    Cancer Maternal Grandmother        unknownorigin   Anemia Neg Hx    Arrhythmia Neg Hx    Clotting disorder Neg Hx    Fainting Neg Hx    Heart attack Neg Hx    Heart failure Neg Hx    Hyperlipidemia Neg Hx    Hypertension Neg Hx    Allergies  Allergen Reactions   Codeine Nausea And Vomiting   Ivp Dye [Iodinated Contrast Media]     Patients states the IV Dye shuts her kidneys down    Patient Care Team: Jacky Kindle, FNP as PCP - General (Family Medicine) Tedd Sias, Marlana Salvage, MD as Physician Assistant (Endocrinology) Duke Salvia, MD as Consulting Physician (Cardiology) Brandy Hale, MD (Inactive) as Referring Physician (Psychiatry) Deirdre Evener, MD as Consulting Physician (Dermatology) Dimmig, Maisie Fus, MD as Referring Physician (Orthopedic Surgery) Schnier, Latina Craver, MD (Vascular Surgery) Albin Felling, OD (Optometry) Gaspar Cola, [facility] (Pharmacist) Salena Saner, MD as Consulting Physician  (Pulmonary Disease) Juanell Fairly, RN as Case Manager   Medications: Outpatient Medications Prior to Visit  Medication Sig   albuterol (VENTOLIN HFA) 108 (90 Base) MCG/ACT inhaler TAKE 2 PUFFS BY MOUTH EVERY 6 HOURS AS NEEDED FOR WHEEZE OR SHORTNESS OF BREATH (Patient taking differently: Inhale 2 puffs into the lungs every 6 (six) hours as needed for wheezing or shortness of breath.)   apixaban (ELIQUIS) 5 MG TABS tablet Take 1 tablet (5 mg total) by mouth 2 (two) times daily.   atorvastatin (LIPITOR) 40 MG tablet Take 1 tablet (40 mg total) by mouth daily.   buPROPion (WELLBUTRIN SR) 150 MG 12 hr tablet Take 1 tablet (150 mg total) by mouth 2 (two) times daily.   calcitRIOL (ROCALTROL) 0.25 MCG capsule Take 0.25 mcg by mouth daily.   carvedilol (COREG) 25 MG tablet Take 1 tablet (25 mg total) by mouth 2 (two) times daily with a meal.   clopidogrel (PLAVIX) 75 MG tablet Take 1 tablet (75 mg total) by mouth daily.   Dulaglutide 3 MG/0.5ML SOPN Inject into the skin once a week.   fluticasone (FLONASE) 50 MCG/ACT nasal spray Place 2 sprays into both nostrils daily.   gabapentin (NEURONTIN) 300 MG capsule Take 300 mg by mouth 2 (two) times daily.   glucose blood test strip OneTouch Verio strips   hydrALAZINE (APRESOLINE) 25 MG tablet Take 1 tablet (25 mg total) by mouth 2 (two) times daily.   insulin aspart (NOVOLOG) 100 UNIT/ML injection Insulin pump   Insulin Disposable Pump (OMNIPOD DASH PODS, GEN 4,) MISC USE 1 POD EACH EVERY 72    HOURS   Insulin Human (INSULIN PUMP) SOLN Per endocrinoloy   JARDIANCE 10 MG TABS tablet Take 10 mg by mouth daily.   loratadine (CLARITIN) 10 MG tablet Take 1 tablet by mouth daily.   meclizine (ANTIVERT) 12.5 MG tablet Take 1 tablet (12.5 mg total) by mouth 3 (three) times daily as needed for dizziness.   OXYGEN Place 2 L into the nose continuous.   PARoxetine (PAXIL) 40 MG tablet Take 1 tablet (40 mg total) by mouth daily.   sacubitril-valsartan (ENTRESTO)  24-26 MG Take 1 tablet by mouth 2 (two) times daily.   Spacer/Aero-Holding [facility]  DIAMOND) MISC Use with inhaler to ensure medication is delivered throughout lungs   torsemide (DEMADEX) 20 MG tablet TAKE 3 TABLETS BY MOUTH EVERY DAY   traMADol (ULTRAM) 50 MG tablet Take 1 tablet (50 mg total) by mouth daily as needed.   Vitamin D, Ergocalciferol, (DRISDOL) 1.25 MG (50000 UNIT) CAPS capsule Take 50,000 Units by mouth every 7 (seven) days.   No facility-administered medications prior to visit.    Review of Systems  Respiratory:  Positive for cough, shortness of breath and wheezing. Negative for apnea, choking and chest tightness.   Skin:  Positive for color change and wound.    Objective    BP (!) 103/55 (BP Location: Right Arm, Patient Position: Sitting, Cuff Size: Large)   Pulse 67   Temp 97.6 F (36.4 C) (Oral)   Resp 20   Wt 211 lb (95.7 kg)   SpO2 100%   BMI 34.06 kg/m    Physical Exam Vitals and nursing note reviewed.  Constitutional:      General: She is awake. She is not in acute distress.    Appearance: Normal appearance. She is well-developed and well-groomed. She is obese. She is not ill-appearing, toxic-appearing or diaphoretic.  HENT:     Head: Normocephalic and atraumatic.     Jaw: There is normal jaw occlusion. No trismus, tenderness, swelling or pain on movement.     Right Ear: Hearing, tympanic membrane, ear canal and external ear normal. There is no impacted cerumen.     Left Ear: Hearing, tympanic membrane, ear canal and external ear normal. There is no impacted cerumen.     Nose: Nose normal. No congestion or rhinorrhea.     Right Turbinates: Not enlarged, swollen or pale.     Left Turbinates: Not enlarged, swollen or pale.     Right Sinus: No maxillary sinus tenderness or frontal sinus tenderness.     Left Sinus: No maxillary sinus tenderness or frontal sinus tenderness.     Mouth/Throat:     Lips: Pink.     Mouth: Mucous membranes are  moist. No injury.     Tongue: No lesions.     Pharynx: Oropharynx is clear. Uvula midline. No pharyngeal swelling, oropharyngeal exudate, posterior oropharyngeal erythema or uvula swelling.     Tonsils: No tonsillar exudate or tonsillar abscesses.  Eyes:     General: Lids are normal. Lids are everted, no foreign bodies appreciated. Vision grossly intact. Gaze aligned appropriately. No allergic shiner or visual field deficit.       Right eye: No discharge.        Left eye: No discharge.     Extraocular Movements: Extraocular movements intact.     Conjunctiva/sclera: Conjunctivae normal.     Right eye: Right conjunctiva is not injected. No exudate.    Left eye: Left conjunctiva is not injected. No exudate.    Pupils: Pupils are equal, round, and reactive to light.  Neck:     Thyroid: No thyroid mass, thyromegaly or thyroid tenderness.     Vascular: No carotid bruit.     Trachea: Trachea normal.  Cardiovascular:     Rate and Rhythm: Normal rate and regular rhythm.     Pulses: Normal pulses.          Carotid pulses are 2+ on the right side and 2+ on the left side.      Radial pulses are 2+ on the right side and 2+ on the left side.  Dorsalis pedis pulses are 2+ on the right side and 2+ on the left side.       Posterior tibial pulses are 2+ on the right side and 2+ on the left side.     Heart sounds: S1 normal and S2 normal. Murmur heard.     No friction rub. No gallop.  Pulmonary:     Effort: Pulmonary effort is normal. No respiratory distress.     Breath sounds: Normal breath sounds and air entry. No stridor. No wheezing, rhonchi or rales.  Chest:     Chest wall: No tenderness.  Abdominal:     General: Abdomen is flat. Bowel sounds are normal. There is no distension.     Palpations: Abdomen is soft. There is no mass.     Tenderness: There is no abdominal tenderness. There is no right CVA tenderness, left CVA tenderness, guarding or rebound.     Hernia: No hernia is present.   Genitourinary:    Comments: Exam deferred; denies complaints Musculoskeletal:        General: No swelling, tenderness, deformity or signs of injury. Normal range of motion.     Cervical back: Full passive range of motion without pain, normal range of motion and neck supple. No edema, rigidity or tenderness. No muscular tenderness.     Right lower leg: Edema present.     Left lower leg: Edema present.  Lymphadenopathy:     Cervical: No cervical adenopathy.     Right cervical: No superficial, deep or posterior cervical adenopathy.    Left cervical: No superficial, deep or posterior cervical adenopathy.  Skin:    General: Skin is warm and dry.     Capillary Refill: Capillary refill takes less than 2 seconds.     Coloration: Skin is not jaundiced or pale.     Findings: Lesion present. No bruising, erythema or rash.     Comments: Healing wound to L shin; proximal/distal  Neurological:     General: No focal deficit present.     Mental Status: She is alert and oriented to person, place, and time. Mental status is at baseline.     GCS: GCS eye subscore is 4. GCS verbal subscore is 5. GCS motor subscore is 6.     Sensory: Sensation is intact. No sensory deficit.     Motor: Weakness present.     Coordination: Coordination is intact. Coordination normal.     Gait: Gait abnormal.     Comments: Use of walker at baseline  Psychiatric:        Attention and Perception: Attention and perception normal.        Mood and Affect: Mood and affect normal.        Speech: Speech normal.        Behavior: Behavior normal. Behavior is cooperative.        Thought Content: Thought content normal.        Cognition and Memory: Cognition and memory normal.        Judgment: Judgment normal.      Last depression screening scores    06/01/2022   10:48 AM 05/19/2022    1:17 PM 02/17/2022    2:53 PM  PHQ 2/9 Scores  PHQ - 2 Score 1    PHQ- 9 Score 7       Information is confidential and restricted. Go to  Review Flowsheets to unlock data.   Last fall risk screening    06/01/2022   10:47 AM  Fall  Risk   Falls in the past year? 1  Number falls in past yr: 1  Injury with Fall? 1  Risk for fall due to : Impaired balance/gait;Impaired mobility;Impaired vision   Last Audit-C alcohol use screening    05/30/2022    8:03 PM  Alcohol Use Disorder Test (AUDIT)  1. How often do you have a drink containing alcohol? 0  3. How often do you have six or more drinks on one occasion? 0   A score of 3 or more in women, and 4 or more in men indicates increased risk for alcohol abuse, EXCEPT if all of the points are from question 1   Results for orders placed or performed in visit on 06/01/22  Protein / creatinine ratio, urine  Result Value Ref Range   Albumin, U 3.7   Protein / creatinine ratio, urine  Result Value Ref Range   Creatinine, Urine 3.7   Results for orders placed or performed in visit on 06/01/22  Hemoglobin A1c  Result Value Ref Range   Hemoglobin A1C 7.8     Assessment & Plan    Routine Health Maintenance and Physical Exam  Exercise Activities and Dietary recommendations  Goals      DIET - EAT MORE FRUITS AND VEGETABLES     Goal: CCM (CHF) Expected Outcome: Monitor, Self-Manage and Reduce Symptoms of Congestive Heart Failure     Current Barriers:  Chronic Disease Management support and education needs related to CHF  Planned Interventions: Reviewed plan for CHF management. Reports compliance with medications. Completed follow up with the Heart Failure clinic earlier today. Medication changes include start of Entresto. Torsemide has been increased to . Reviewed weight parameters. Reports weighing daily. Reports receiving scale for virtual weight monitoring. Reports readings are being electronically submitted to [facility].  Reviewed s/sx of fluid overload and indications for notifying a provider. Denies abdominal or lower extremity edema. Continues to experience  shortness of breath with exertion. Currently requires supplemental oxygen. Using 2-3 L/min. Reports all oxygen saturations have ranged >90%. Pending evaluation for portable oxygen eligibility. Agency plans to complete this test in the home. Reviewed worsening s/sx related to CHF exacerbation and indications for seeking immediate medical attention.   Wt Readings from Last 3 Encounters:  02/03/22 223 lb (101.2 kg)  01/29/22 238 lb (108 kg)  01/28/22 236 lb 9.6 oz (107.3 kg)    Wt Readings from Last 3 Encounters:  12/16/21 248 lb 4 oz (112.6 kg)  12/15/21 245 lb 9.6 oz (111.4 kg)  12/09/21 243 lb 9.6 oz (110.5 kg)     Symptom Management: Take medications as prescribed   Attend all scheduled provider appointments Call pharmacy for medication refills 3-7 days in advance of running out of medications Call provider office for new concerns or questions  Call office for weight gain more than 2 pounds in one day or 5 pounds in one week Weigh myself daily Track weight in diary Watch for swelling in feet, ankles and legs every day Keep legs up while sitting Track symptoms  Seek immediate medical attention for worsening symptoms   Follow Up Plan:  Will follow up next month       Goal: CCM (Diabetes) Expected Outcome: Monitor, Self-Manage and Reduce Symptoms of Diabetes     Current Barriers:  Chronic Disease Management support and education needs related to Diabetes Management  Planned Interventions: Reviewed plan for diabetes management. Reports compliance with medications. Reports insulin is being administered via Omnipod. Reports using FreeStyle  Libre for continuous glucose monitoring. Diabetes is not currently controlled.  Reports recent fasting readings have been within range. Denies recent hypoglycemic or hyperglycemic episodes. Reports attempting to adhere to nutritional recommendations. Advised to increase intake of fruits, vegetables, fiber, and lean proteins. Advised to closely  monitor carbs and avoid foods with added sugars when possible. Reviewed preventive exams. Eye and foot exams are up to date. Denies changes or open areas to skin. Currently requiring bilateral Unna boot for edema. Reports current Home Health plan is to change wraps twice a week. Will continue follow up with the Vascular team. Will follow up with the Nephrology team on 02/10/22.   Lab Results  Component Value Date   HGBA1C 10.2 (H) 11/23/2021    Symptom Management: Take all medications as prescribed Attend all scheduled provider appointments Call pharmacy for medication refills 3-7 days in advance of running out of medications Call provider office for new concerns or questions  Keep appointment with eye doctor Continue monitoring CGM device  Keep appointments for biweekly Unna boot change Monitor carbohydrate intake and avoid foods with added sugars when possible  Follow Up Plan:  Will follow up next month       Goal: CCM (HTN) Expected Outcome: Monitor, Self-Manage and Reduce Symptoms of Hypertension     Current Barriers:  Chronic Disease Management support and education needs related to Hypertension  Planned Interventions: Reviewed plan for hypertension management. Remains compliant with medications and treatment plan. Reviewed blood pressure parameters. Reports currently having electronic monitoring device for BP and EKG. Reports readings are being monitored by [facility]. Ability to engage in activity remains limited d/t chronic joint pain. Reviewed importance of complying with recommended cardiac prudent diet. Encouraged to continue reading nutrition labels, monitor sodium intake and avoid highly processed foods when possible. Encouraged to keep the care management team updated of care management needs.  Reviewed pending appointments. Pending follow up with the Cardiology team in January. Reviewed s/sx of heart attack, stroke and worsening symptoms that require immediate  medical attention.  BP Readings from Last 3 Encounters:  02/03/22 (!) 127/56  01/29/22 138/78  01/28/22 130/67    Symptom Management: Take all medications as prescribed Attend all scheduled provider appointments Call pharmacy for medication refills 3-7 days in advance of running out of medications Call provider office for new concerns or questions  Check blood pressure daily Keep a blood pressure log Call doctor for signs and symptoms of high blood pressure Report new symptoms to your doctor  Follow Up Plan:   Will follow up next month.       Have 3 meals a day     Recommend eating 3 small meals a day with 2 healthy snack in between to aid in weight loss.      Increase water intake     Starting 03/11/16, I will increase my water intake to 3 glasses a day.     Quit Smoking     Recommend to continue efforts to reduce smoking habits until no longer smoking (Smoking Cessation literature attached to AVS).          Immunization History  Administered Date(s) Administered   Fluad Quad(high Dose 65+) 01/07/2021, 11/23/2021   Influenza Inj Mdck Quad Pf 12/20/2018   Influenza Split 01/05/2006, 10/31/2007, 03/03/2009, 12/16/2009, 12/23/2010, 12/24/2011, 12/19/2013   Influenza,inj,Quad PF,6+ Mos 11/23/2012, 11/07/2015, 11/15/2017   Influenza-Unspecified 11/07/2015, 11/03/2016   PFIZER(Purple Top)SARS-COV-2 Vaccination 03/01/2019, 03/22/2019   Pneumococcal Polysaccharide-23 12/23/2010   Zoster Recombinat (Shingrix) 06/03/2016, 12/11/2016  Health Maintenance  Topic Date Due   DTaP/Tdap/Td (1 - Tdap) Never done   MAMMOGRAM  Never done   Lung Cancer Screening  04/27/2018   DEXA SCAN  11/18/2019   OPHTHALMOLOGY EXAM  07/15/2021   COVID-19 Vaccine (3 - 2023-24 season) 10/09/2021   Pneumonia Vaccine 106+ Years old (2 of 2 - PCV) 12/05/2022 (Originally 12/23/2011)   INFLUENZA VACCINE  09/09/2022   HEMOGLOBIN A1C  10/23/2022   FOOT EXAM  01/20/2023   Medicare Annual Wellness  (AWV)  06/01/2023   COLONOSCOPY (Pts 45-22yrs Insurance coverage will need to be confirmed)  02/16/2027   Hepatitis C Screening  Completed   Zoster Vaccines- Shingrix  Completed   HPV VACCINES  Aged Out    Discussed health benefits of physical activity, and encouraged her to engage in regular exercise appropriate for her age and condition.  Problem List Items Addressed This Visit       Musculoskeletal and Integument   Osteoporosis, post-menopausal    Chronic, due for repeat DEXA Complicated by CKD and plan for upcoming iHD Remains on calcium supplementation Unable to exercise d/t weakness and chronic debility       Relevant Orders   DG Bone Density     Other   Encounter for screening mammogram for malignant neoplasm of breast    Due for screening for mammogram, denies breast concerns, provided with phone number to call and schedule appointment for mammogram. Encouraged to repeat breast cancer screening every 1-2 years.       Relevant Orders   MM 3D SCREENING MAMMOGRAM BILATERAL BREAST   Encounter for subsequent annual wellness visit (AWV) in Medicare patient - Primary    Patient followed by multiple specialists given complexity of health care Due for dental; UTD on vision Declines vaccinations Recommend DEXA and mammo Things to do to keep yourself healthy  - Exercise at least 30-45 minutes a day, 3-4 days a week.  - Eat a low-fat diet with lots of fruits and vegetables, up to 7-9 servings per day.  - Seatbelts can save your life. Wear them always.  - Smoke detectors on every level of your home, check batteries every year.  - Eye Doctor - have an eye exam every 1-2 years  - Safe sex - if you may be exposed to STDs, use a condom.  - Alcohol -  If you drink, do it moderately, less than 2 drinks per day.  - Health Care Power of Attorney. Choose someone to speak for you if you are not able.  - Depression is common in our stressful world.If you're feeling down or losing  interest in things you normally enjoy, please come in for a visit.  - Violence - If anyone is threatening or hurting you, please call immediately.       Personal history of nicotine dependence    Chronic, previous daily use; now has been on/off quit tobacco for past 10 minutes Due for tobacco screen- low dose CT scan Denies concerns for breathing/respiratory issues at current time      Relevant Orders   Ambulatory Referral Lung Cancer Screening [facility]   Return in about 6 months (around 12/01/2022) for chonic disease management.    Leilani Merl, FNP, have reviewed all documentation for this visit. The documentation on 06/01/22 for the exam, diagnosis, procedures, and orders are all accurate and complete.  Jacky Kindle, FNP  [facility] [phone] (phone) [phone] (fax)  [facility]

## 2022-05-31 NOTE — Telephone Encounter (Signed)
05/31/2022 02:17 PM EDT by Sue Lush, LPN  Jocelyn Wells, Jocelyn Wells (Self) [phone] (Home) Remove  Left Message - lft msg to rtn call to clinic for AWV  05/31/2022 02:37 PM EDT by Sue Lush, LPN  Outgoing Jocelyn Wells, Jocelyn Wells (Self) [phone] (Home) Remove  Left Message - rtn call to r/s

## 2022-06-01 ENCOUNTER — Encounter: Payer: Self-pay | Admitting: Family Medicine

## 2022-06-01 ENCOUNTER — Ambulatory Visit (INDEPENDENT_AMBULATORY_CARE_PROVIDER_SITE_OTHER): Payer: Medicare Other | Admitting: Family Medicine

## 2022-06-01 VITALS — BP 103/55 | HR 67 | Temp 97.6°F | Resp 20 | Wt 211.0 lb

## 2022-06-01 DIAGNOSIS — N2581 Secondary hyperparathyroidism of renal origin: Secondary | ICD-10-CM | POA: Diagnosis not present

## 2022-06-01 DIAGNOSIS — R809 Proteinuria, unspecified: Secondary | ICD-10-CM | POA: Diagnosis not present

## 2022-06-01 DIAGNOSIS — E663 Overweight: Secondary | ICD-10-CM | POA: Diagnosis not present

## 2022-06-01 DIAGNOSIS — N184 Chronic kidney disease, stage 4 (severe): Secondary | ICD-10-CM | POA: Diagnosis not present

## 2022-06-01 DIAGNOSIS — N185 Chronic kidney disease, stage 5: Secondary | ICD-10-CM | POA: Diagnosis not present

## 2022-06-01 DIAGNOSIS — Z0000 Encounter for general adult medical examination without abnormal findings: Secondary | ICD-10-CM | POA: Diagnosis not present

## 2022-06-01 DIAGNOSIS — M810 Age-related osteoporosis without current pathological fracture: Secondary | ICD-10-CM

## 2022-06-01 DIAGNOSIS — E875 Hyperkalemia: Secondary | ICD-10-CM | POA: Diagnosis not present

## 2022-06-01 DIAGNOSIS — F32A Depression, unspecified: Secondary | ICD-10-CM | POA: Diagnosis not present

## 2022-06-01 DIAGNOSIS — I10 Essential (primary) hypertension: Secondary | ICD-10-CM | POA: Diagnosis not present

## 2022-06-01 DIAGNOSIS — Z87891 Personal history of nicotine dependence: Secondary | ICD-10-CM

## 2022-06-01 DIAGNOSIS — R600 Localized edema: Secondary | ICD-10-CM | POA: Diagnosis not present

## 2022-06-01 DIAGNOSIS — I129 Hypertensive chronic kidney disease with stage 1 through stage 4 chronic kidney disease, or unspecified chronic kidney disease: Secondary | ICD-10-CM | POA: Diagnosis not present

## 2022-06-01 DIAGNOSIS — Z1231 Encounter for screening mammogram for malignant neoplasm of breast: Secondary | ICD-10-CM | POA: Insufficient documentation

## 2022-06-01 DIAGNOSIS — D631 Anemia in chronic kidney disease: Secondary | ICD-10-CM | POA: Diagnosis not present

## 2022-06-01 DIAGNOSIS — E1122 Type 2 diabetes mellitus with diabetic chronic kidney disease: Secondary | ICD-10-CM | POA: Diagnosis not present

## 2022-06-01 NOTE — Assessment & Plan Note (Signed)
Chronic, due for repeat DEXA Complicated by CKD and plan for upcoming iHD Remains on calcium supplementation Unable to exercise d/t weakness and chronic debility

## 2022-06-01 NOTE — Patient Instructions (Addendum)
Please call and schedule your mammogram and DEXA [bone density] [facility]  [address]  Main: [phone]

## 2022-06-01 NOTE — Assessment & Plan Note (Signed)
Due for screening for mammogram, denies breast concerns, provided with phone number to call and schedule appointment for mammogram. Encouraged to repeat breast cancer screening every 1-2 years.  

## 2022-06-01 NOTE — Assessment & Plan Note (Signed)
Chronic, previous daily use; now has been on/off quit tobacco for past 10 minutes Due for tobacco screen- low dose CT scan Denies concerns for breathing/respiratory issues at current time

## 2022-06-01 NOTE — Assessment & Plan Note (Signed)
Patient followed by multiple specialists given complexity of health care Due for dental; UTD on vision Declines vaccinations Recommend DEXA and mammo Things to do to keep yourself healthy  - Exercise at least 30-45 minutes a day, 3-4 days a week.  - Eat a low-fat diet with lots of fruits and vegetables, up to 7-9 servings per day.  - Seatbelts can save your life. Wear them always.  - Smoke detectors on every level of your home, check batteries every year.  - Eye Doctor - have an eye exam every 1-2 years  - Safe sex - if you may be exposed to STDs, use a condom.  - Alcohol -  If you drink, do it moderately, less than 2 drinks per day.  - Health Care Power of Attorney. Choose someone to speak for you if you are not able.  - Depression is common in our stressful world.If you're feeling down or losing interest in things you normally enjoy, please come in for a visit.  - Violence - If anyone is threatening or hurting you, please call immediately.

## 2022-06-02 ENCOUNTER — Telehealth: Payer: Self-pay | Admitting: Licensed Clinical Social Worker

## 2022-06-02 ENCOUNTER — Telehealth (INDEPENDENT_AMBULATORY_CARE_PROVIDER_SITE_OTHER): Payer: Self-pay | Admitting: Licensed Clinical Social Worker

## 2022-06-02 DIAGNOSIS — Z91199 Patient's noncompliance with other medical treatment and regimen due to unspecified reason: Secondary | ICD-10-CM

## 2022-06-02 NOTE — Progress Notes (Addendum)
LCSW at 1:05PM resent link to pt's virtual appt via text and email. LCSW attempted to call pt at 1:14PM to inquire about pt attendance at today's appt. The voice mailbox was full.  

## 2022-06-02 NOTE — Telephone Encounter (Signed)
LCSW at 1:05PM resent link to pt's virtual appt via text and email. LCSW attempted to call pt at 1:14PM to inquire about pt attendance at today's appt. The voice mailbox was full.

## 2022-06-03 ENCOUNTER — Telehealth: Payer: Self-pay

## 2022-06-03 NOTE — Telephone Encounter (Signed)
Jocelyn Wells with paramedicine program left message for our office that [name] refused the paramedicine program at this time.  He states the pt's family informed him they take care of her medications and the patient is part of [facility] and they do not need any other services right now.

## 2022-06-04 ENCOUNTER — Telehealth: Payer: Self-pay

## 2022-06-04 ENCOUNTER — Telehealth: Payer: Self-pay | Admitting: Cardiology

## 2022-06-04 NOTE — Telephone Encounter (Signed)
Faxed received from [facility] with medication changes:  Stop Entresto 24/26 mg BID due to worsening kidney function  Will route to Clarisa Kindred , FNP  for [employer].

## 2022-06-04 NOTE — Telephone Encounter (Signed)
   Pre-operative Risk Assessment    Patient Name: Jocelyn Wells  DOB: September 29, 1955 MRN: [id number]      Request for Surgical Clearance    Procedure:   retinal surgery in the left eye  Date of Surgery:  Clearance TBD                                 Surgeon:  Stephannie Li Surgeon's Group or Practice Name:  [facility] Phone number:  [phone] Fax number:  [phone]   Type of Clearance Requested:   - Pharmacy:  Hold Aspirin instructions or any blood thinners   Type of Anesthesia:  MAC   Additional requests/questions:    Courtney Heys   06/04/2022, 4:50 PM

## 2022-06-07 ENCOUNTER — Telehealth: Payer: Self-pay | Admitting: *Deleted

## 2022-06-07 ENCOUNTER — Encounter
Admission: RE | Admit: 2022-06-07 | Discharge: 2022-06-07 | Disposition: A | Discharge status: Home / Self Care | Payer: Medicare Other | Source: Ambulatory Visit | Attending: Vascular Surgery | Admitting: Vascular Surgery

## 2022-06-07 ENCOUNTER — Other Ambulatory Visit (INDEPENDENT_AMBULATORY_CARE_PROVIDER_SITE_OTHER): Payer: Self-pay | Admitting: Nurse Practitioner

## 2022-06-07 ENCOUNTER — Other Ambulatory Visit: Payer: Self-pay

## 2022-06-07 ENCOUNTER — Telehealth: Payer: Self-pay

## 2022-06-07 VITALS — BP 144/76 | HR 71 | Resp 20 | Ht 66.0 in | Wt 211.2 lb

## 2022-06-07 DIAGNOSIS — N184 Chronic kidney disease, stage 4 (severe): Secondary | ICD-10-CM | POA: Insufficient documentation

## 2022-06-07 DIAGNOSIS — I452 Bifascicular block: Secondary | ICD-10-CM | POA: Diagnosis not present

## 2022-06-07 DIAGNOSIS — I10 Essential (primary) hypertension: Secondary | ICD-10-CM | POA: Diagnosis not present

## 2022-06-07 DIAGNOSIS — Z01818 Encounter for other preprocedural examination: Secondary | ICD-10-CM | POA: Diagnosis not present

## 2022-06-07 DIAGNOSIS — E1122 Type 2 diabetes mellitus with diabetic chronic kidney disease: Secondary | ICD-10-CM | POA: Diagnosis not present

## 2022-06-07 DIAGNOSIS — N2581 Secondary hyperparathyroidism of renal origin: Secondary | ICD-10-CM | POA: Diagnosis not present

## 2022-06-07 DIAGNOSIS — I517 Cardiomegaly: Secondary | ICD-10-CM | POA: Diagnosis not present

## 2022-06-07 DIAGNOSIS — Z01812 Encounter for preprocedural laboratory examination: Secondary | ICD-10-CM

## 2022-06-07 DIAGNOSIS — N185 Chronic kidney disease, stage 5: Secondary | ICD-10-CM | POA: Diagnosis not present

## 2022-06-07 DIAGNOSIS — E1129 Type 2 diabetes mellitus with other diabetic kidney complication: Secondary | ICD-10-CM

## 2022-06-07 DIAGNOSIS — R600 Localized edema: Secondary | ICD-10-CM | POA: Diagnosis not present

## 2022-06-07 HISTORY — DX: Unspecified osteoarthritis, unspecified site: M19.90

## 2022-06-07 HISTORY — DX: Dyspnea, unspecified: R06.00

## 2022-06-07 HISTORY — DX: Headache, unspecified: R51.9

## 2022-06-07 HISTORY — DX: Peripheral vascular disease, unspecified: I73.9

## 2022-06-07 LAB — CBC WITH DIFFERENTIAL/PLATELET
Abs Immature Granulocytes: 0.05 10*3/uL (ref 0.00–0.07)
Basophils Absolute: 0.1 10*3/uL (ref 0.0–0.1)
Basophils Relative: 1 %
Eosinophils Absolute: 0.6 10*3/uL — ABNORMAL HIGH (ref 0.0–0.5)
Eosinophils Relative: 5 %
HCT: 33.6 % — ABNORMAL LOW (ref 36.0–46.0)
Hemoglobin: 10.1 g/dL — ABNORMAL LOW (ref 12.0–15.0)
Immature Granulocytes: 0 %
Lymphocytes Relative: 11 %
Lymphs Abs: 1.4 10*3/uL (ref 0.7–4.0)
MCH: 27.6 pg (ref 26.0–34.0)
MCHC: 30.1 g/dL (ref 30.0–36.0)
MCV: 91.8 fL (ref 80.0–100.0)
Monocytes Absolute: 0.8 10*3/uL (ref 0.1–1.0)
Monocytes Relative: 7 %
Neutro Abs: 9.2 10*3/uL — ABNORMAL HIGH (ref 1.7–7.7)
Neutrophils Relative %: 76 %
Platelets: 541 10*3/uL — ABNORMAL HIGH (ref 150–400)
RBC: 3.66 MIL/uL — ABNORMAL LOW (ref 3.87–5.11)
RDW: 18.1 % — ABNORMAL HIGH (ref 11.5–15.5)
WBC: 12.2 10*3/uL — ABNORMAL HIGH (ref 4.0–10.5)
nRBC: 0 % (ref 0.0–0.2)

## 2022-06-07 LAB — TYPE AND SCREEN
ABO/RH(D): A POS
Antibody Screen: NEGATIVE

## 2022-06-07 NOTE — Telephone Encounter (Signed)
-----   Message from Verlee Monte, NP sent at 06/05/2022  7:47 PM EDT ----- Regarding: Request for pre-operative cardiac clearance Request for pre-operative cardiac clearance:  1. What type of surgery is being performed?  ARTERIOVENOUS (AV) FISTULA CREATION (BRACHIALCEPHALIC)  2. When is this surgery scheduled?  06/18/2022  3. Type of clearance being requested (medical, pharmacy, both)? BOTH   4. Are there any medications that need to be held prior to surgery? CLOPIDOGREL + APIXABAN  5. Practice name and name of physician performing surgery?  Performing surgeon: Dr. Levora Dredge, MD Requesting clearance: Quentin Mulling, FNP-C    6. Anesthesia type (none, local, MAC, general)? GENERAL  7. What is the office phone and fax number?   Phone: [phone] Fax: [phone]  ATTENTION: Unable to create telephone message as per your standard workflow. Directed by [facility] providers to send requests for cardiac clearance to this pool for appropriate distribution to provider covering pre-operative clearances.   Quentin Mulling, MSN, APRN, FNP-C, CEN [facility]  Peri-operative Services Nurse Practitioner Phone: [phone] 06/05/22 7:47 PM

## 2022-06-07 NOTE — Telephone Encounter (Signed)
   Patient Name: Jocelyn Wells  DOB: Sep 07, 1955 MRN: [id number]  Primary Cardiologist: None  Chart reviewed as part of pre-operative protocol coverage.    From a cardiac standpoint, patient okay to hold Plavix for 5 days prior to AV fistula creation, however, patient takes Plavix also for history of PVD, recent peripheral vascular intervention, managed per vascular surgery. She is on Eliquis for history of DVT. Cardiology does not manage patient's Eliquis. Therefore, final recommendations for holding Plavix and prior to procedure should come from additional managing providers.  Napoleon Form, Leodis Rains, NP 06/07/2022, 8:50 AM

## 2022-06-07 NOTE — Telephone Encounter (Signed)
  Chief Complaint: Assessment Aproval Symptoms: NA Frequency: NA Pertinent Negatives: Patient denies Na Disposition: [] ED /[] Urgent Care (no appt availability in office) / [] Appointment(In office/virtual)/ []  [place] [facility]/ [] Home Care/ [] Refused Recommended Disposition /[] [facility]/ []  Follow-up with PCP Additional Notes: '[name]' RN with [facility] calling regarding pts wound on right leg "Trauma wound." States would like approval on nurses assessment and treatment. "Cleanse wound, apply calcium alginate, dry dressing." Please advise: [phone], option 2.

## 2022-06-07 NOTE — Telephone Encounter (Addendum)
   Patient Name: Jocelyn Wells  DOB: Jun 24, 1955 MRN: [id number]  Primary Cardiologist: None  Chart reviewed as part of pre-operative protocol coverage.  From a cardiac standpoint, patient okay to hold Plavix for 5 days prior to retina surgery in the left.  However, patient takes Plavix also for history of PVD, recent peripheral vascular intervention, managed per vascular surgery Dr. Gilda Crease.  She is on Eliquis for history of DVT.  Cardiology does not manage patient's Eliquis.  Therefore, final recommendations for holding Plavix and prior to procedure should come from additional managing providers.     Napoleon Form, Leodis Rains, NP 06/07/2022, 7:28 AM

## 2022-06-07 NOTE — Pre-Procedure Instructions (Addendum)
Patient has Omnipod insulin pump, followed by Dr. Tedd Sias, Edrick Oh Dm Coordinator notified, per [facility] patient is to contact Dr. Tedd Sias and get instructions for any changes needed during her surgery in regards to her Omnipod insulin pump. Patient and Dr. Tedd Sias nurse has been made aware by this writer.

## 2022-06-07 NOTE — Patient Instructions (Addendum)
Your procedure is scheduled on: 06/18/22 - Friday Report to the Registration Desk on the 1st floor of the [facility]. To find out your arrival time, please call [phone] between 1PM - 3PM on: 06/17/22 - Thursday If your arrival time is 6:00 am, do not arrive before that time as the Medical Mall entrance doors do not open until 6:00 am.  REMEMBER: Instructions that are not followed completely may result in serious medical risk, up to and including death; or upon the discretion of your surgeon and anesthesiologist your surgery may need to be rescheduled.  Do not eat food or drink any liquids after midnight the night before surgery.  No gum chewing or hard candies.   One week prior to surgery: Stop Anti-inflammatories (NSAIDS) such as Advil, Aleve, Ibuprofen, Motrin, Naproxen, Naprosyn and Aspirin based products such as Excedrin, Goody's Powder, BC Powder.  Stop ANY OVER THE COUNTER supplements until after surgery.  You may however, continue to take Tylenol if needed for pain up until the day of surgery.  Continue taking all prescribed medications with the exception of the following:  - Hold apixaban (ELIQUIS) beginning 06/15/22. - Hold Jardiance beginning 06/15/22  - Hold Dulaglutide 7 days prior to your surgery, Hold Monday's dose on 06/13/22.  Contact Dr. Tedd Sias and inform her that you are having surgery on 06/18/22. You have an Omnipod Insulin Pump, ask her if you need to make any changes during this process.  TAKE ONLY THESE MEDICATIONS THE MORNING OF SURGERY WITH A SIP OF WATER:  albuterol (VENTOLIN HFA) use the morning of surgery and bring to the hospital with you. atorvastatin (LIPITOR)  buPROPion (WELLBUTRIN SR)  carvedilol (COREG)  gabapentin (NEURONTIN)  PARoxetine (PAXIL)    No Alcohol for 24 hours before or after surgery.  No Smoking including e-cigarettes for 24 hours before surgery.  No chewable tobacco products for at least 6 hours before surgery.  No  nicotine patches on the day of surgery.  Do not use any "recreational" drugs for at least a week (preferably 2 weeks) before your surgery.  Please be advised that the combination of cocaine and anesthesia may have negative outcomes, up to and including death. If you test positive for cocaine, your surgery will be cancelled.  On the morning of surgery brush your teeth with toothpaste and water, you may rinse your mouth with mouthwash if you wish. Do not swallow any toothpaste or mouthwash.  Use CHG Soap or wipes as directed on instruction sheet.  Do not wear jewelry, make-up, hairpins, clips or nail polish.  Do not wear lotions, powders, or perfumes.   Do not shave body hair from the neck down 48 hours before surgery.  Contact lenses, hearing aids and dentures may not be worn into surgery.  Do not bring valuables to the hospital. [facility] is not responsible for any missing/lost belongings or valuables.   Notify your doctor if there is any change in your medical condition (cold, fever, infection).  Wear comfortable clothing (specific to your surgery type) to the hospital.  After surgery, you can help prevent lung complications by doing breathing exercises.  Take deep breaths and cough every 1-2 hours. Your doctor may order a device called an Incentive Spirometer to help you take deep breaths. When coughing or sneezing, hold a pillow firmly against your incision with both hands. This is called "splinting." Doing this helps protect your incision. It also decreases belly discomfort.  If you are being admitted to the hospital overnight,  leave your suitcase in the car. After surgery it may be brought to your room.  In case of increased patient census, it may be necessary for you, the patient, to continue your postoperative care in the Same Day Surgery department.  If you are being discharged the day of surgery, you will not be allowed to drive home. You will need a responsible individual  to drive you home and stay with you for 24 hours after surgery.   If you are taking public transportation, you will need to have a responsible individual with you.  Please call the Pre-admissions Testing Dept. at [phone] if you have any questions about these instructions.  Surgery Visitation Policy:  Patients having surgery or a procedure may have two visitors.  Children under the age of 45 must have an adult with them who is not the patient.  Inpatient Visitation:    Visiting hours are 7 a.m. to 8 p.m. Up to four visitors are allowed at one time in a patient room. The visitors may rotate out with other people during the day.  One visitor age 42 or older may stay with the patient overnight and must be in the room by 8 p.m.    Preparing for Surgery with CHLORHEXIDINE GLUCONATE (CHG) Soap  Chlorhexidine Gluconate (CHG) Soap  o An antiseptic cleaner that kills germs and bonds with the skin to continue killing germs even after washing  o Used for showering the night before surgery and morning of surgery  Before surgery, you can play an important role by reducing the number of germs on your skin.  CHG (Chlorhexidine gluconate) soap is an antiseptic cleanser which kills germs and bonds with the skin to continue killing germs even after washing.  Please do not use if you have an allergy to CHG or antibacterial soaps. If your skin becomes reddened/irritated stop using the CHG.  1. Shower the NIGHT BEFORE SURGERY and the MORNING OF SURGERY with CHG soap.  2. If you choose to wash your hair, wash your hair first as usual with your normal shampoo.  3. After shampooing, rinse your hair and body thoroughly to remove the shampoo.  4. Use CHG as you would any other liquid soap. You can apply CHG directly to the skin and wash gently with a scrungie or a clean washcloth.  5. Apply the CHG soap to your body only from the neck down. Do not use on open wounds or open sores. Avoid contact  with your eyes, ears, mouth, and genitals (private parts). Wash face and genitals (private parts) with your normal soap.  6. Wash thoroughly, paying special attention to the area where your surgery will be performed.  7. Thoroughly rinse your body with warm water.  8. Do not shower/wash with your normal soap after using and rinsing off the CHG soap.  9. Pat yourself dry with a clean towel.  10. Wear clean pajamas to bed the night before surgery.  12. Place clean sheets on your bed the night of your first shower and do not sleep with pets.  13. Shower again with the CHG soap on the day of surgery prior to arriving at the hospital.  14. Do not apply any deodorants/lotions/powders.  15. Please wear clean clothes to the hospital. Preparing the Skin Before Surgery     To help prevent the risk of infection at your surgical site, we are now providing you with rinse-free Sage 2% Chlorhexidine Gluconate (CHG) disposable wipes.  Chlorhexidine Gluconate (CHG)  Soap  o An antiseptic cleaner that kills germs and bonds with the skin to continue killing germs even after washing  o Used for showering the night before surgery and morning of surgery  The night before surgery: Shower or bathe with warm water. Do not apply perfume, lotions, powders. Wait one hour after shower. Skin should be dry and cool. Open Sage wipe package - use 6 disposable cloths. Wipe body using one cloth for the right arm, one cloth for the left arm, one cloth for the right leg, one cloth for the left leg, one cloth for the chest/abdomen area, and one cloth for the back. Do not use on open wounds or sores. Do not use on face or genitals (private parts). If you are breast feeding, do not use on breasts. 5. Do not rinse, allow to dry. 6. Skin may feel "tacky" for several minutes. 7. Dress in clean clothes. 8. Place clean sheets on your bed and do not sleep with pets.  REPEAT ABOVE ON THE MORNING OF SURGERY BEFORE  ARRIVING TO THE HOSPITAL.

## 2022-06-08 NOTE — Telephone Encounter (Signed)
Spoke with [name]. Relayed FNP approval of orders: "Cleanse wound, apply calcium alginate, dry dressing."

## 2022-06-09 ENCOUNTER — Ambulatory Visit
Payer: Medicare Other | Attending: Student in an Organized Health Care Education/Training Program | Admitting: Student in an Organized Health Care Education/Training Program

## 2022-06-09 ENCOUNTER — Encounter: Payer: Self-pay | Admitting: Student in an Organized Health Care Education/Training Program

## 2022-06-09 VITALS — BP 134/72 | HR 64 | Temp 97.2°F | Resp 16 | Ht 66.0 in | Wt 209.0 lb

## 2022-06-09 DIAGNOSIS — E1140 Type 2 diabetes mellitus with diabetic neuropathy, unspecified: Secondary | ICD-10-CM | POA: Diagnosis not present

## 2022-06-09 MED ORDER — CAPSAICIN-CLEANSING GEL 8 % EX KIT
4.0000 | PACK | Freq: Once | CUTANEOUS | Status: AC
  Administered 2022-06-09: 4 via TOPICAL
  Filled 2022-06-09: qty 4

## 2022-06-09 NOTE — Patient Instructions (Signed)

## 2022-06-09 NOTE — Progress Notes (Signed)
Safety precautions to be maintained throughout the outpatient stay will include: orient to surroundings, keep bed in low position, maintain call bell within reach at all times, provide assistance with transfer out of bed and ambulation.  

## 2022-06-09 NOTE — Progress Notes (Signed)
PROVIDER NOTE: Interpretation of information contained herein should be left to medically-trained personnel. Specific patient instructions are provided elsewhere under "Patient Instructions" section of medical record. This document was created in part using STT-dictation technology, any transcriptional errors that may result from this process are unintentional.  Patient: Jocelyn Wells Type: Established DOB: 1955-05-10 MRN: [id number] PCP: Jacky Kindle, FNP  Service: Procedure DOS: 06/09/2022 Setting: Ambulatory Location: Ambulatory outpatient facility Delivery: Face-to-face Provider: Edward Jolly, MD Specialty: Interventional Pain Management Specialty designation: 09 Location: Outpatient facility Ref. Prov.: Jacky Kindle, FNP       Interventional Therapy   Interventional Treatment:           Type: Qutenza Neurolysis #1  Laterality:  Bilateral Area treated: Feet Imaging Guidance: None Anesthesia/analgesia/anxiolysis/sedation: None required Medication (Right): Qutenza (capsaicin 8%) topical system Medication (Left): Qutenza (capsaicin 8%) topical system Date: 06/09/2022 Performed by: Edward Jolly, MD Rationale (medical necessity): procedure needed and proper for the treatment of [name]'s medical symptoms and needs. Indication: Painful diabetic peripheral neuralgia (DPN) (ICD-10-CM:E11.40) severe enough to impact quality of life or function. 1. Chronic painful diabetic neuropathy (HCC)    NAS-11 Pain score:   Pre-procedure: 5 /10   Post-procedure: 5 /10     Position / Prep / Materials:  Position: Supine  Materials: Qutenza Kit  Pre-op H&P Assessment:  Jocelyn Wells is a 67 y.o. (year old), female patient, seen today for interventional treatment. She  has a past surgical history that includes Abdominal hysterectomy; Tubal ligation (02/09/1976); Cesarean section (02/09/1976); Knee arthroscopy (Left, 02/09/2003); Ulnar nerve transposition (02/08/2006); Bilateral  salpingoophorectomy (02/08/1998); Ankle fracture surgery (Left, 02/09/2000); TEE without cardioversion (N/A, 01/31/2015); Cardiac catheterization (N/A, 03/25/2015); Cardiac catheterization (N/A, 08/05/2015); Esophagogastroduodenoscopy (egd) with propofol (N/A, 09/22/2016); Colonoscopy with propofol (N/A, 09/22/2016); Esophagogastroduodenoscopy (egd) with propofol (N/A, 12/08/2016); Lower Extremity Angiography (Left, 08/12/2021); TEMPORARY DIALYSIS CATHETER (N/A, 08/14/2021); Fracture surgery; Colonoscopy with propofol (N/A, 02/15/2022); Esophagogastroduodenoscopy (N/A, 02/15/2022); and Givens capsule study (N/A, 04/19/2022). [name] has a current medication list which includes the following prescription(s): albuterol, apixaban, atorvastatin, bupropion, calcitriol, carvedilol, clopidogrel, dulaglutide, fluticasone, gabapentin, glucose blood, insulin aspart, omnipod dash pods (gen 4), insulin pump, jardiance, loratadine, meclizine, oxygen-helium, paroxetine, optichamber diamond, torsemide, tramadol, and vitamin d (ergocalciferol). Her primarily concern today is the Foot Pain (bilateral)  Initial Vital Signs:  Pulse/HCG Rate: 64  Temp: (!) 97.2 F (36.2 C) Resp: 16 BP: 134/72 SpO2: 96 %  BMI: Estimated body mass index is 33.73 kg/m as calculated from the following:   Height as of this encounter: 5\' 6"  (1.676 m).   Weight as of this encounter: 209 lb (94.8 kg).  Risk Assessment: Allergies: Reviewed. She is allergic to codeine and ivp dye [iodinated contrast media].  Allergy Precautions: None required Coagulopathies: Reviewed. None identified.  Blood-thinner therapy: None at this time Active Infection(s): Reviewed. None identified. Jocelyn Wells is afebrile  Site Confirmation: Ms. Laine was asked to confirm the procedure and laterality before marking the site Procedure checklist: Completed Consent: Before the procedure and under the influence of no sedative(s), amnesic(s), or anxiolytics, the  patient was informed of the treatment options, risks and possible complications. To fulfill our ethical and legal obligations, as recommended by the American Medical Association's Code of Ethics, I have informed the patient of my clinical impression; the nature and purpose of the treatment or procedure; the risks, benefits, and possible complications of the intervention; the alternatives, including doing nothing; the risk(s) and benefit(s) of the alternative treatment(s) or procedure(s); and the risk(s) and  benefit(s) of doing nothing. The patient was provided information about the general risks and possible complications associated with the procedure. These may include, but are not limited to: failure to achieve desired goals, infection, bleeding, organ or nerve damage, allergic reactions, paralysis, and death. In addition, the patient was informed of those risks and complications associated to the procedure, such as failure to decrease pain; infection; bleeding; organ or nerve damage with subsequent damage to sensory, motor, and/or autonomic systems, resulting in permanent pain, numbness, and/or weakness of one or several areas of the body; allergic reactions; (i.e.: anaphylactic reaction); and/or death. Furthermore, the patient was informed of those risks and complications associated with the medications. These include, but are not limited to: allergic reactions (i.e.: anaphylactic or anaphylactoid reaction(s)); adrenal axis suppression; blood sugar elevation that in diabetics may result in ketoacidosis or comma; water retention that in patients with history of congestive heart failure may result in shortness of breath, pulmonary edema, and decompensation with resultant heart failure; weight gain; swelling or edema; medication-induced neural toxicity; particulate matter embolism and blood vessel occlusion with resultant organ, and/or nervous system infarction; and/or aseptic necrosis of one or more  joints. Finally, the patient was informed that Medicine is not an exact science; therefore, there is also the possibility of unforeseen or unpredictable risks and/or possible complications that may result in a catastrophic outcome. The patient indicated having understood very clearly. We have given the patient no guarantees and we have made no promises. Enough time was given to the patient to ask questions, all of which were answered to the patient's satisfaction. Ms. Deuser has indicated that she wanted to continue with the procedure. Attestation: I, the ordering provider, attest that I have discussed with the patient the benefits, risks, side-effects, alternatives, likelihood of achieving goals, and potential problems during recovery for the procedure that I have provided informed consent. Date  Time: 06/09/2022  1:07 PM  Pre-Procedure Preparation:  Monitoring: As per clinic protocol. Respiration, ETCO2, SpO2, BP, heart rate and rhythm monitor placed and checked for adequate function Safety Precautions: Patient was assessed for positional comfort and pressure points before starting the procedure. Time-out: I initiated and conducted the "Time-out" before starting the procedure, as per protocol. The patient was asked to participate by confirming the accuracy of the "Time Out" information. Verification of the correct person, site, and procedure were performed and confirmed by me, the nursing staff, and the patient. "Time-out" conducted as per Joint Commission's Universal Protocol (UP.01.01.01). Time: 1330 Start Time: 1330 hrs.  Description/Narrative of Procedure:          Region: Distal lower extremity Target Area: Sensory peripheral nerves affected by diabetic peripheral neuropathy Site: Feet Approach: Percutaneous  No./Series: Not applicable  Type: Percutaneous  Purpose: Therapeutic  Region: Distal lower extremities  Start Time: 1330 hrs.  Description of the Procedure: Protocol guidelines  were followed. The patient was assisted into a comfortable position.  Informed consent was obtained in the patient monitored in the usual manner.  All questions were answered prior to the procedure.  They Qutenza patches were applied to the affected area and then covered with the wrap.  The Patient was kept under observation until the treatment was completed.  The patches were removed and the treated area was inspected.  Vitals:   06/09/22 1313  BP: 134/72  Pulse: 64  Resp: 16  Temp: (!) 97.2 F (36.2 C)  SpO2: 96%  Weight: 209 lb (94.8 kg)  Height: 5\' 6"  (1.676 m)  End Time: 1415 hrs.  Imaging Guidance:          Type of Imaging Technique: None used Indication(s): N/A Exposure Time: No patient exposure Contrast: None used. Fluoroscopic Guidance: N/A Ultrasound Guidance: N/A Interpretation: N/A  Post-operative Assessment:  Post-procedure Vital Signs:  Pulse/HCG Rate: 64  Temp: (!) 97.2 F (36.2 C) Resp: 16 BP: 134/72 SpO2: 96 %  EBL: None  Complications: No immediate post-treatment complications observed by team, or reported by patient.  Note: The patient tolerated the entire procedure well. A repeat set of vitals were taken after the procedure and the patient was kept under observation following institutional policy, for this type of procedure. Post-procedural neurological assessment was performed, showing return to baseline, prior to discharge. The patient was provided with post-procedure discharge instructions, including a section on how to identify potential problems. Should any problems arise concerning this procedure, the patient was given instructions to immediately contact us, at any time, without hesitation. In any case, we plan to contact the patient by telephone for a follow-up status report regarding this interventional procedure.  Comments:  No additional relevant information.  Plan of Care (POC)  Orders:  No orders of the defined types were placed in this  encounter.  Chronic Opioid Analgesic:  Tramadol 50 mg twice daily as needed, quantity 60/month    Medications ordered for procedure: Meds ordered this encounter  Medications   capsaicin topical system 8 % patch 4 patch   Medications administered: We administered capsaicin topical system.  See the medical record for exact dosing, route, and time of administration.  Follow-up plan:   Return in about 6 weeks (around 07/21/2022) for Post Procedure Evaluation, in person.       Recent Visits Date Type Provider Dept  05/11/22 Office Visit Edward Jolly, MD [facility]  Showing recent visits within past 90 days and meeting all other requirements Today's Visits Date Type Provider Dept  06/09/22 Procedure visit Edward Jolly, MD [facility]  Showing today's visits and meeting all other requirements Future Appointments Date Type Provider Dept  07/21/22 Appointment Edward Jolly, MD [facility]  Showing future appointments within next 90 days and meeting all other requirements  Disposition: Discharge home  Discharge (Date  Time): 06/09/2022; 1426 hrs.   Primary Care Physician: Jacky Kindle, FNP Location: [facility] Note by: Edward Jolly, MD (TTS technology used. I apologize for any typographical errors that were not detected and corrected.) Date: 06/09/2022; Time: 3:02 PM  Disclaimer:  Medicine is not an Visual merchandiser. The only guarantee in medicine is that nothing is guaranteed. It is important to note that the decision to proceed with this intervention was based on the information collected from the patient. The Data and conclusions were drawn from the patient's questionnaire, the interview, and the physical examination. Because the information was provided in large part by the patient, it cannot be guaranteed that it has not been purposely or unconsciously manipulated. Every effort has been made to obtain as much relevant data  as possible for this evaluation. It is important to note that the conclusions that lead to this procedure are derived in large part from the available data. Always take into account that the treatment will also be dependent on availability of resources and existing treatment guidelines, considered by other Pain Management Practitioners as being common knowledge and practice, at the time of the intervention. For Medico-Legal purposes, it is also important to point out that variation in procedural  techniques and pharmacological choices are the acceptable norm. The indications, contraindications, technique, and results of the above procedure should only be interpreted and judged by a Board-Certified Interventional Pain Specialist with extensive familiarity and expertise in the same exact procedure and technique.

## 2022-06-10 ENCOUNTER — Telehealth: Payer: Self-pay

## 2022-06-10 NOTE — Telephone Encounter (Signed)
Post procedure follow up.  LM 

## 2022-06-13 NOTE — Progress Notes (Signed)
MRN : [id number]  Jocelyn Wells is a 67 y.o. (24-Nov-1955) female who presents with chief complaint of check circulation.  History of Present Illness:   The patient is seen today for evaluation for dialysis access. The patient has chronic renal insufficiency stage IV secondary.  The patient's most recent creatinine clearance is 13 (obtained 04/22/2022). The patient volume status has not yet become an issue. Patient's blood pressures been relatively well controlled. There are minimal uremic symptoms which appear to be relatively well tolerated at this time.   In the past her post angio course was complicated by acute renal failure ans she did require dialysis.  She subsequently has returned to her baseline stage 4 renal dysfunction as noted by her renal function studies dated December 07, 2021.   The patient is also followed for ASO of the lower extremities.    There have been no interval changes in lower extremity symptoms. No interval shortening of the patient's claudication distance or development of rest pain symptoms. No new ulcers or wounds have occurred since the last visit.   Procedure:  Percutaneous transluminal angioplasty and stent placement left superficial       femoral artery and popliteal to 5 mm proximally and 4 mm distally. 2.    Percutaneous transluminal angioplasty of the left common iliac artery to 9 mm.   3.    Percutaneous transluminal angioplasty of the left peroneal artery to 3 mm.   4.    Mechanical thrombectomy left tibioperoneal trunk and peroneal.    The patient is also followed for DVT.  DVT was identified at [facility] by Duplex ultrasound in June 2020.  The initial symptoms were pain and swelling in the lower extremity.  She was initiated on anticoagulation at that time    No documented history of amaurosis fugax or recent TIA symptoms. There are no recent neurological changes noted. No documented history of DVT, PE or superficial  thrombophlebitis. The patient denies recent episodes of angina or shortness of breath.    ABI's today Rt=0.74  (monophasic) and Lt=1.11 (triphasic)  (previous ABI's Rt=0.79 and Lt=0.96)   Previous duplex US of the upper extremities for vein mapping shows an excellent cephalic vein at the antecubital fossa measuring 5 mm in diameter.   No outpatient medications have been marked as taking for the 06/14/22 encounter (Appointment) with Gilda Crease, Latina Craver, MD.    Past Medical History:  Diagnosis Date   Anemia    Anxiety    Arthritis    Cardiomyopathy [facility])    new to her Jan 2017   CHF (congestive heart failure) (HCC)    Chronic kidney disease    COPD (chronic obstructive pulmonary disease) (HCC)    Depression    Diabetes mellitus, type II (HCC)    Dyspnea    Headache    HTN (hypertension)    Leukocytosis    Obesity    Osteoporosis    Peripheral vascular disease (HCC)    PONV (postoperative nausea and vomiting)    Stroke [facility])    Jan 2017    Past Surgical History:  Procedure Laterality Date   ABDOMINAL HYSTERECTOMY     ANKLE FRACTURE SURGERY Left 02/09/2000   BILATERAL SALPINGOOPHORECTOMY  02/08/1998   CARDIAC CATHETERIZATION N/A 03/25/2015   Procedure: Right/Left Heart Cath and Coronary Angiography;  Surgeon: Lyn Records, MD;  Location: [facility] INVASIVE CV LAB;  Service: Cardiovascular;  Laterality: N/A;   CESAREAN SECTION  02/09/1976   COLONOSCOPY WITH PROPOFOL N/A 09/22/2016   Procedure: COLONOSCOPY WITH PROPOFOL;  Surgeon: Scot Jun, MD;  Location: [facility] ENDOSCOPY;  Service: Endoscopy;  Laterality: N/A;   COLONOSCOPY WITH PROPOFOL N/A 02/15/2022   Procedure: COLONOSCOPY WITH PROPOFOL;  Surgeon: Toney Reil, MD;  Location: [facility] ENDOSCOPY;  Service: Gastroenterology;  Laterality: N/A;   EP IMPLANTABLE DEVICE N/A 08/05/2015   Procedure: Loop Recorder Insertion;  Surgeon: Duke Salvia, MD;  Location: [facility] INVASIVE CV LAB;  Service: Cardiovascular;  Laterality: N/A;    ESOPHAGOGASTRODUODENOSCOPY N/A 02/15/2022   Procedure: ESOPHAGOGASTRODUODENOSCOPY (EGD);  Surgeon: Toney Reil, MD;  Location: [facility] ENDOSCOPY;  Service: Gastroenterology;  Laterality: N/A;   ESOPHAGOGASTRODUODENOSCOPY (EGD) WITH PROPOFOL N/A 09/22/2016   Procedure: ESOPHAGOGASTRODUODENOSCOPY (EGD) WITH PROPOFOL;  Surgeon: Scot Jun, MD;  Location: [facility] ENDOSCOPY;  Service: Endoscopy;  Laterality: N/A;   ESOPHAGOGASTRODUODENOSCOPY (EGD) WITH PROPOFOL N/A 12/08/2016   Procedure: ESOPHAGOGASTRODUODENOSCOPY (EGD) WITH PROPOFOL;  Surgeon: Scot Jun, MD;  Location: [facility] ENDOSCOPY;  Service: Endoscopy;  Laterality: N/A;   FRACTURE SURGERY     GIVENS CAPSULE STUDY N/A 04/19/2022   Procedure: GIVENS CAPSULE STUDY;  Surgeon: Toney Reil, MD;  Location: [facility] ENDOSCOPY;  Service: Gastroenterology;  Laterality: N/A;   KNEE ARTHROSCOPY Left 02/09/2003   LOWER EXTREMITY ANGIOGRAPHY Left 08/12/2021   Procedure: Lower Extremity Angiography;  Surgeon: Renford Dills, MD;  Location: [facility] INVASIVE CV LAB;  Service: Cardiovascular;  Laterality: Left;   TEE WITHOUT CARDIOVERSION N/A 01/31/2015   Procedure: TRANSESOPHAGEAL ECHOCARDIOGRAM (TEE);  Surgeon: Lewayne Bunting, MD;  Location: [facility] ENDOSCOPY;  Service: Cardiovascular;  Laterality: N/A;   TEMPORARY DIALYSIS CATHETER N/A 08/14/2021   Procedure: TEMPORARY DIALYSIS CATHETER;  Surgeon: Renford Dills, MD;  Location: [facility] INVASIVE CV LAB;  Service: Cardiovascular;  Laterality: N/A;   TUBAL LIGATION  02/09/1976   ULNAR NERVE TRANSPOSITION  02/08/2006    Social History Social History   Tobacco Use   Smoking status: Former    Packs/day: 0.50    Years: 47.00    Additional pack years: 0.00    Total pack years: 23.50    Types: Cigarettes    Start date: 02/08/1974    Quit date: 05/31/2022    Years since quitting: 0.0   Smokeless tobacco: Never  Vaping Use   Vaping Use: Never used  Substance Use Topics   Alcohol use: No     Alcohol/week: 0.0 standard drinks of alcohol   Drug use: No    Family History Family History  Problem Relation Age of Onset   Heart disease Mother        died from CHF   Asthma Mother    Diabetes Mother    Heart disease Father    Aneurysm Father    COPD Brother    Diabetes Brother    Alcohol abuse Paternal Aunt    Cancer Maternal Grandmother        unknownorigin   Anemia Neg Hx    Arrhythmia Neg Hx    Clotting disorder Neg Hx    Fainting Neg Hx    Heart attack Neg Hx    Heart failure Neg Hx    Hyperlipidemia Neg Hx    Hypertension Neg Hx     Allergies  Allergen Reactions   Codeine Nausea And Vomiting   Ivp Dye [Iodinated Contrast Media]     Patients states the IV Dye shuts her kidneys down  REVIEW OF SYSTEMS (Negative unless checked)  Constitutional: [] Weight loss  [] Fever  [] Chills Cardiac: [] Chest pain   [] Chest pressure   [] Palpitations   [] Shortness of breath when laying flat   [] Shortness of breath with exertion. Vascular:  [x] Pain in legs with walking   [] Pain in legs at rest  [] History of DVT   [] Phlebitis   [] Swelling in legs   [] Varicose veins   [] Non-healing ulcers Pulmonary:   [] Uses home oxygen   [] Productive cough   [] Hemoptysis   [] Wheeze  [] COPD   [] Asthma Neurologic:  [] Dizziness   [] Seizures   [] History of stroke   [] History of TIA  [] Aphasia   [] Vissual changes   [] Weakness or numbness in arm   [] Weakness or numbness in leg Musculoskeletal:   [] Joint swelling   [] Joint pain   [] Low back pain Hematologic:  [] Easy bruising  [] Easy bleeding   [] Hypercoagulable state   [] Anemic Gastrointestinal:  [] Diarrhea   [] Vomiting  [] Gastroesophageal reflux/heartburn   [] Difficulty swallowing. Genitourinary:  [] Chronic kidney disease   [] Difficult urination  [] Frequent urination   [] Blood in urine Skin:  [] Rashes   [] Ulcers  Psychological:  [] History of anxiety   []  History of major depression.  Physical Examination  There were no vitals filed for this  visit. There is no height or weight on file to calculate BMI. Gen: WD/WN, NAD Head: [place]/AT, No temporalis wasting.  Ear/Nose/Throat: Hearing grossly intact, nares w/o erythema or drainage Eyes: PER, EOMI, sclera nonicteric.  Neck: Supple, no masses.  No bruit or JVD.  Pulmonary:  Good air movement, no audible wheezing, no use of accessory muscles.  Cardiac: RRR, normal S1, S2, no Murmurs. Vascular:   Vessel Right Left  Radial Palpable Palpable  PT Not Palpable Not Palpable  DP Not Palpable Not Palpable  Gastrointestinal: soft, non-distended. No guarding/no peritoneal signs.  Musculoskeletal: M/S 5/5 throughout.  No visible deformity.  Neurologic: CN 2-12 intact. Pain and light touch intact in extremities.  Symmetrical.  Speech is fluent. Motor exam as listed above. Psychiatric: Judgment intact, Mood & affect appropriate for pt's clinical situation. Dermatologic: No rashes or ulcers noted.  No changes consistent with cellulitis.   CBC Lab Results  Component Value Date   WBC 12.2 (H) 06/07/2022   HGB 10.1 (L) 06/07/2022   HCT 33.6 (L) 06/07/2022   MCV 91.8 06/07/2022   PLT 541 (H) 06/07/2022    BMET    Component Value Date/Time   NA 135 02/26/2022 1625   NA 140 12/04/2021 1542   K 5.0 02/26/2022 1625   CL 102 02/26/2022 1625   CO2 26 02/26/2022 1625   GLUCOSE 299 (H) 02/26/2022 1625   BUN 52 (H) 02/26/2022 1625   BUN 38 (H) 12/04/2021 1542   CREATININE 3.27 (H) 02/26/2022 1625   CREATININE 1.37 (H) 03/21/2015 1451   CALCIUM 9.0 02/26/2022 1625   GFRNONAA 13 04/22/2022 1233   GFRAA 33 (L) 04/23/2019 1446   CrCl cannot be calculated (Patient's most recent lab result is older than the maximum 21 days allowed.).  COAG Lab Results  Component Value Date   INR 1.5 (H) 08/14/2021   INR 0.93 10/05/2017   INR 1.05 03/21/2015    Radiology No results found.   Assessment/Plan 1. Atherosclerosis of native artery of both lower extremities with intermittent claudication  (HCC)  Recommend:  The patient has evidence of atherosclerosis of the lower extremities with claudication.  The patient does not voice lifestyle limiting changes at this point in time.  Noninvasive studies do not suggest clinically significant change.  No invasive studies, angiography or surgery at this time The patient should continue walking and begin a more formal exercise program.  The patient should continue antiplatelet therapy and aggressive treatment of the lipid abnormalities  No changes in the patient's medications at this time  Continued surveillance is indicated as atherosclerosis is likely to progress with time.    The patient will continue follow up with noninvasive studies as ordered.   2. CKD (chronic kidney disease), stage IV (HCC) Recommend:  At this time the patient does not have appropriate extremity access for dialysis  Patient should have a left brachial cephalic fistula created.  The risks, benefits and alternative therapies were reviewed in detail with the patient.  All questions were answered.  The patient agrees to proceed with surgery.   The patient will follow up with me in the office after the surgery.  3. Essential hypertension Continue antihypertensive medications as already ordered, these medications have been reviewed and there are no changes at this time.  4. Chronic obstructive pulmonary disease, unspecified COPD type (HCC) Continue pulmonary medications and aerosols as already ordered, these medications have been reviewed and there are no changes at this time.   5. Type 2 diabetes mellitus with other diabetic kidney complication (HCC) Continue hypoglycemic medications as already ordered, these medications have been reviewed and there are no changes at this time.  Hgb A1C to be monitored as already arranged by primary service  6. Hyperlipidemia associated with type 2 diabetes mellitus (HCC) Continue statin as ordered and reviewed, no changes  at this time   Levora Dredge, MD  06/13/2022 1:22 PM

## 2022-06-13 NOTE — H&P (View-Only) (Signed)
               MRN : [id number]  Jocelyn Wells is a 67 y.o. (08/22/1955) female who presents with chief complaint of check circulation.  History of Present Illness:   The patient is seen today for evaluation for dialysis access. The patient has chronic renal insufficiency stage IV secondary.  The patient's most recent creatinine clearance is 13 (obtained 04/22/2022). The patient volume status has not yet become an issue. Patient's blood pressures been relatively well controlled. There are minimal uremic symptoms which appear to be relatively well tolerated at this time.   In the past her post angio course was complicated by acute renal failure ans she did require dialysis.  She subsequently has returned to her baseline stage 4 renal dysfunction as noted by her renal function studies dated December 07, 2021.   The patient is also followed for ASO of the lower extremities.    There have been no interval changes in lower extremity symptoms. No interval shortening of the patient's claudication distance or development of rest pain symptoms. No new ulcers or wounds have occurred since the last visit.   Procedure:  Percutaneous transluminal angioplasty and stent placement left superficial       femoral artery and popliteal to 5 mm proximally and 4 mm distally. 2.    Percutaneous transluminal angioplasty of the left common iliac artery to 9 mm.   3.    Percutaneous transluminal angioplasty of the left peroneal artery to 3 mm.   4.    Mechanical thrombectomy left tibioperoneal trunk and peroneal.    The patient is also followed for DVT.  DVT was identified at [facility] by Duplex ultrasound in June 2020.  The initial symptoms were pain and swelling in the lower extremity.  She was initiated on anticoagulation at that time    No documented history of amaurosis fugax or recent TIA symptoms. There are no recent neurological changes noted. No documented history of DVT, PE or superficial  thrombophlebitis. The patient denies recent episodes of angina or shortness of breath.    ABI's today Rt=0.74  (monophasic) and Lt=1.11 (triphasic)  (previous ABI's Rt=0.79 and Lt=0.96)   Previous duplex US of the upper extremities for vein mapping shows an excellent cephalic vein at the antecubital fossa measuring 5 mm in diameter.   No outpatient medications have been marked as taking for the 06/14/22 encounter (Appointment) with Jayren Cease G, MD.    Past Medical History:  Diagnosis Date   Anemia    Anxiety    Arthritis    Cardiomyopathy (HCC)    new to her Jan 2017   CHF (congestive heart failure) (HCC)    Chronic kidney disease    COPD (chronic obstructive pulmonary disease) (HCC)    Depression    Diabetes mellitus, type II (HCC)    Dyspnea    Headache    HTN (hypertension)    Leukocytosis    Obesity    Osteoporosis    Peripheral vascular disease (HCC)    PONV (postoperative nausea and vomiting)    Stroke (HCC)    Jan 2017    Past Surgical History:  Procedure Laterality Date   ABDOMINAL HYSTERECTOMY     ANKLE FRACTURE SURGERY Left 02/09/2000   BILATERAL SALPINGOOPHORECTOMY  02/08/1998   CARDIAC CATHETERIZATION N/A 03/25/2015   Procedure: Right/Left Heart Cath and Coronary Angiography;  Surgeon: Henry W Smith, MD;  Location: [facility] INVASIVE CV LAB;  Service: Cardiovascular;    Laterality: N/A;   CESAREAN SECTION  02/09/1976   COLONOSCOPY WITH PROPOFOL N/A 09/22/2016   Procedure: COLONOSCOPY WITH PROPOFOL;  Surgeon: Elliott, Robert T, MD;  Location: [facility] ENDOSCOPY;  Service: Endoscopy;  Laterality: N/A;   COLONOSCOPY WITH PROPOFOL N/A 02/15/2022   Procedure: COLONOSCOPY WITH PROPOFOL;  Surgeon: Vanga, Rohini Reddy, MD;  Location: [facility] ENDOSCOPY;  Service: Gastroenterology;  Laterality: N/A;   EP IMPLANTABLE DEVICE N/A 08/05/2015   Procedure: Loop Recorder Insertion;  Surgeon: Steven C Klein, MD;  Location: [facility] INVASIVE CV LAB;  Service: Cardiovascular;  Laterality: N/A;    ESOPHAGOGASTRODUODENOSCOPY N/A 02/15/2022   Procedure: ESOPHAGOGASTRODUODENOSCOPY (EGD);  Surgeon: Vanga, Rohini Reddy, MD;  Location: [facility] ENDOSCOPY;  Service: Gastroenterology;  Laterality: N/A;   ESOPHAGOGASTRODUODENOSCOPY (EGD) WITH PROPOFOL N/A 09/22/2016   Procedure: ESOPHAGOGASTRODUODENOSCOPY (EGD) WITH PROPOFOL;  Surgeon: Elliott, Robert T, MD;  Location: [facility] ENDOSCOPY;  Service: Endoscopy;  Laterality: N/A;   ESOPHAGOGASTRODUODENOSCOPY (EGD) WITH PROPOFOL N/A 12/08/2016   Procedure: ESOPHAGOGASTRODUODENOSCOPY (EGD) WITH PROPOFOL;  Surgeon: Elliott, Robert T, MD;  Location: [facility] ENDOSCOPY;  Service: Endoscopy;  Laterality: N/A;   FRACTURE SURGERY     GIVENS CAPSULE STUDY N/A 04/19/2022   Procedure: GIVENS CAPSULE STUDY;  Surgeon: Vanga, Rohini Reddy, MD;  Location: [facility] ENDOSCOPY;  Service: Gastroenterology;  Laterality: N/A;   KNEE ARTHROSCOPY Left 02/09/2003   LOWER EXTREMITY ANGIOGRAPHY Left 08/12/2021   Procedure: Lower Extremity Angiography;  Surgeon: Hervey Wedig G, MD;  Location: [facility] INVASIVE CV LAB;  Service: Cardiovascular;  Laterality: Left;   TEE WITHOUT CARDIOVERSION N/A 01/31/2015   Procedure: TRANSESOPHAGEAL ECHOCARDIOGRAM (TEE);  Surgeon: Brian S Crenshaw, MD;  Location: [facility] ENDOSCOPY;  Service: Cardiovascular;  Laterality: N/A;   TEMPORARY DIALYSIS CATHETER N/A 08/14/2021   Procedure: TEMPORARY DIALYSIS CATHETER;  Surgeon: Doneshia Hill G, MD;  Location: [facility] INVASIVE CV LAB;  Service: Cardiovascular;  Laterality: N/A;   TUBAL LIGATION  02/09/1976   ULNAR NERVE TRANSPOSITION  02/08/2006    Social History Social History   Tobacco Use   Smoking status: Former    Packs/day: 0.50    Years: 47.00    Additional pack years: 0.00    Total pack years: 23.50    Types: Cigarettes    Start date: 02/08/1974    Quit date: 05/31/2022    Years since quitting: 0.0   Smokeless tobacco: Never  Vaping Use   Vaping Use: Never used  Substance Use Topics   Alcohol use: No     Alcohol/week: 0.0 standard drinks of alcohol   Drug use: No    Family History Family History  Problem Relation Age of Onset   Heart disease Mother        died from CHF   Asthma Mother    Diabetes Mother    Heart disease Father    Aneurysm Father    COPD Brother    Diabetes Brother    Alcohol abuse Paternal Aunt    Cancer Maternal Grandmother        unknownorigin   Anemia Neg Hx    Arrhythmia Neg Hx    Clotting disorder Neg Hx    Fainting Neg Hx    Heart attack Neg Hx    Heart failure Neg Hx    Hyperlipidemia Neg Hx    Hypertension Neg Hx     Allergies  Allergen Reactions   Codeine Nausea And Vomiting   Ivp Dye [Iodinated Contrast Media]     Patients states the IV Dye shuts her kidneys down       REVIEW OF SYSTEMS (Negative unless checked)  Constitutional: []Weight loss  []Fever  []Chills Cardiac: []Chest pain   []Chest pressure   []Palpitations   []Shortness of breath when laying flat   []Shortness of breath with exertion. Vascular:  [x]Pain in legs with walking   []Pain in legs at rest  []History of DVT   []Phlebitis   []Swelling in legs   []Varicose veins   []Non-healing ulcers Pulmonary:   []Uses home oxygen   []Productive cough   []Hemoptysis   []Wheeze  []COPD   []Asthma Neurologic:  []Dizziness   []Seizures   []History of stroke   []History of TIA  []Aphasia   []Vissual changes   []Weakness or numbness in arm   []Weakness or numbness in leg Musculoskeletal:   []Joint swelling   []Joint pain   []Low back pain Hematologic:  []Easy bruising  []Easy bleeding   []Hypercoagulable state   []Anemic Gastrointestinal:  []Diarrhea   []Vomiting  []Gastroesophageal reflux/heartburn   []Difficulty swallowing. Genitourinary:  []Chronic kidney disease   []Difficult urination  []Frequent urination   []Blood in urine Skin:  []Rashes   []Ulcers  Psychological:  []History of anxiety   [] History of major depression.  Physical Examination  There were no vitals filed for this  visit. There is no height or weight on file to calculate BMI. Gen: WD/WN, NAD Head: [place]/AT, No temporalis wasting.  Ear/Nose/Throat: Hearing grossly intact, nares w/o erythema or drainage Eyes: PER, EOMI, sclera nonicteric.  Neck: Supple, no masses.  No bruit or JVD.  Pulmonary:  Good air movement, no audible wheezing, no use of accessory muscles.  Cardiac: RRR, normal S1, S2, no Murmurs. Vascular:   Vessel Right Left  Radial Palpable Palpable  PT Not Palpable Not Palpable  DP Not Palpable Not Palpable  Gastrointestinal: soft, non-distended. No guarding/no peritoneal signs.  Musculoskeletal: M/S 5/5 throughout.  No visible deformity.  Neurologic: CN 2-12 intact. Pain and light touch intact in extremities.  Symmetrical.  Speech is fluent. Motor exam as listed above. Psychiatric: Judgment intact, Mood & affect appropriate for pt's clinical situation. Dermatologic: No rashes or ulcers noted.  No changes consistent with cellulitis.   CBC Lab Results  Component Value Date   WBC 12.2 (H) 06/07/2022   HGB 10.1 (L) 06/07/2022   HCT 33.6 (L) 06/07/2022   MCV 91.8 06/07/2022   PLT 541 (H) 06/07/2022    BMET    Component Value Date/Time   NA 135 02/26/2022 1625   NA 140 12/04/2021 1542   K 5.0 02/26/2022 1625   CL 102 02/26/2022 1625   CO2 26 02/26/2022 1625   GLUCOSE 299 (H) 02/26/2022 1625   BUN 52 (H) 02/26/2022 1625   BUN 38 (H) 12/04/2021 1542   CREATININE 3.27 (H) 02/26/2022 1625   CREATININE 1.37 (H) 03/21/2015 1451   CALCIUM 9.0 02/26/2022 1625   GFRNONAA 13 04/22/2022 1233   GFRAA 33 (L) 04/23/2019 1446   CrCl cannot be calculated (Patient's most recent lab result is older than the maximum 21 days allowed.).  COAG Lab Results  Component Value Date   INR 1.5 (H) 08/14/2021   INR 0.93 10/05/2017   INR 1.05 03/21/2015    Radiology No results found.   Assessment/Plan 1. Atherosclerosis of native artery of both lower extremities with intermittent claudication  (HCC)  Recommend:  The patient has evidence of atherosclerosis of the lower extremities with claudication.  The patient does not voice lifestyle limiting changes at this point in time.    Noninvasive studies do not suggest clinically significant change.  No invasive studies, angiography or surgery at this time The patient should continue walking and begin a more formal exercise program.  The patient should continue antiplatelet therapy and aggressive treatment of the lipid abnormalities  No changes in the patient's medications at this time  Continued surveillance is indicated as atherosclerosis is likely to progress with time.    The patient will continue follow up with noninvasive studies as ordered.   2. CKD (chronic kidney disease), stage IV (HCC) Recommend:  At this time the patient does not have appropriate extremity access for dialysis  Patient should have a left brachial cephalic fistula created.  The risks, benefits and alternative therapies were reviewed in detail with the patient.  All questions were answered.  The patient agrees to proceed with surgery.   The patient will follow up with me in the office after the surgery.  3. Essential hypertension Continue antihypertensive medications as already ordered, these medications have been reviewed and there are no changes at this time.  4. Chronic obstructive pulmonary disease, unspecified COPD type (HCC) Continue pulmonary medications and aerosols as already ordered, these medications have been reviewed and there are no changes at this time.   5. Type 2 diabetes mellitus with other diabetic kidney complication (HCC) Continue hypoglycemic medications as already ordered, these medications have been reviewed and there are no changes at this time.  Hgb A1C to be monitored as already arranged by primary service  6. Hyperlipidemia associated with type 2 diabetes mellitus (HCC) Continue statin as ordered and reviewed, no changes  at this time   Yao Hyppolite, MD  06/13/2022 1:22 PM   

## 2022-06-14 ENCOUNTER — Encounter (INDEPENDENT_AMBULATORY_CARE_PROVIDER_SITE_OTHER): Payer: Self-pay | Admitting: Vascular Surgery

## 2022-06-14 ENCOUNTER — Ambulatory Visit (INDEPENDENT_AMBULATORY_CARE_PROVIDER_SITE_OTHER): Payer: Medicare Other | Admitting: Vascular Surgery

## 2022-06-14 VITALS — BP 155/81 | HR 71 | Resp 18 | Ht 66.0 in | Wt 198.0 lb

## 2022-06-14 DIAGNOSIS — E1129 Type 2 diabetes mellitus with other diabetic kidney complication: Secondary | ICD-10-CM | POA: Diagnosis not present

## 2022-06-14 DIAGNOSIS — I70213 Atherosclerosis of native arteries of extremities with intermittent claudication, bilateral legs: Secondary | ICD-10-CM

## 2022-06-14 DIAGNOSIS — N184 Chronic kidney disease, stage 4 (severe): Secondary | ICD-10-CM | POA: Diagnosis not present

## 2022-06-14 DIAGNOSIS — E1169 Type 2 diabetes mellitus with other specified complication: Secondary | ICD-10-CM | POA: Diagnosis not present

## 2022-06-14 DIAGNOSIS — E1122 Type 2 diabetes mellitus with diabetic chronic kidney disease: Secondary | ICD-10-CM | POA: Diagnosis not present

## 2022-06-14 DIAGNOSIS — R809 Proteinuria, unspecified: Secondary | ICD-10-CM | POA: Diagnosis not present

## 2022-06-14 DIAGNOSIS — N185 Chronic kidney disease, stage 5: Secondary | ICD-10-CM | POA: Diagnosis not present

## 2022-06-14 DIAGNOSIS — M810 Age-related osteoporosis without current pathological fracture: Secondary | ICD-10-CM | POA: Diagnosis not present

## 2022-06-14 DIAGNOSIS — I10 Essential (primary) hypertension: Secondary | ICD-10-CM | POA: Diagnosis not present

## 2022-06-14 DIAGNOSIS — J449 Chronic obstructive pulmonary disease, unspecified: Secondary | ICD-10-CM | POA: Diagnosis not present

## 2022-06-14 DIAGNOSIS — E785 Hyperlipidemia, unspecified: Secondary | ICD-10-CM | POA: Diagnosis not present

## 2022-06-14 DIAGNOSIS — R600 Localized edema: Secondary | ICD-10-CM | POA: Diagnosis not present

## 2022-06-14 DIAGNOSIS — N2581 Secondary hyperparathyroidism of renal origin: Secondary | ICD-10-CM | POA: Diagnosis not present

## 2022-06-16 ENCOUNTER — Telehealth (INDEPENDENT_AMBULATORY_CARE_PROVIDER_SITE_OTHER): Payer: Medicare Other | Admitting: Licensed Clinical Social Worker

## 2022-06-16 ENCOUNTER — Encounter: Payer: Self-pay | Admitting: Vascular Surgery

## 2022-06-16 DIAGNOSIS — F3341 Major depressive disorder, recurrent, in partial remission: Secondary | ICD-10-CM

## 2022-06-16 DIAGNOSIS — F4310 Post-traumatic stress disorder, unspecified: Secondary | ICD-10-CM | POA: Diagnosis not present

## 2022-06-16 NOTE — Progress Notes (Signed)
Perioperative / Anesthesia Services  Pre-Admission Testing Clinical Review / Preoperative Anesthesia Consult  Date: 06/17/22  Patient Demographics:  Name: Jocelyn Wells:   September 16, 1955 MRN:   [id number]  Planned Surgical Procedure(s):    Case: [id number] Date/Time: 06/18/22 0715   Procedure: ARTERIOVENOUS (AV) FISTULA CREATION (BRACHIALCEPHALIC) (Left)   Anesthesia type: General   Pre-op diagnosis: ESRD   Location: [facility] OR ROOM 08 / [facility]   Surgeons: Renford Dills, MD     NOTE: Available PAT nursing documentation and vital signs have been reviewed. Clinical nursing staff has updated patient's PMH/PSHx, current medication list, and drug allergies/intolerances to ensure comprehensive history available to assist in medical decision making as it pertains to the aforementioned surgical procedure and anticipated anesthetic course. Extensive review of available clinical information personally performed. Jocelyn Wells PMH and PSHx updated with any diagnoses/procedures that  may have been inadvertently omitted during her intake with the pre-admission testing department's nursing staff.  Clinical Discussion:  Jocelyn Wells is a 67 y.o. female who is submitted for pre-surgical anesthesia review and clearance prior to her undergoing the above procedure. Patient is a Former Smoker (23.5 pack years; quit 05/2022). Pertinent PMH includes: NICM, HFrEF, ischemic LEFT MCA stroke, PVD, HTN, T2DM, ESRD, dyspnea, COPD (on supplemental oxygen), anemia of chronic renal disease, OA, anxiety, depression.  Patient is followed by cardiology Azucena Cecil, MD). She was last seen in the cardiology clinic on 02/11/2022; notes reviewed. At the time of her clinic visit, patient doing well overall from a cardiovascular perspective.  Patient complaining of intermittent episodes of dizziness and fatigue. Patient denied any chest pain, shortness of breath, PND, orthopnea, palpitations, significant  peripheral edema, weakness, or presyncope/syncope. Patient with a past medical history significant for cardiovascular diagnoses. Documented physical exam was grossly benign, providing no evidence of acute exacerbation and/or decompensation of the patient's known cardiovascular conditions.  Patient suffered an acute CVA on 01/29/2015.  MRI of the brain revealed acute infarction in the LEFT MCA territory involving the posterior LEFT frontal lobe and LEFT insular cortex.  Patient with a diagnosis of a NICM with resulting HFrEF.  Initial EF reduced at 30-35% on 01/30/2015.  Patient underwent diagnostic RIGHT/LEFT heart catheterization on 03/25/2015 revealing a severely reduced left ventricular systolic function with an EF of 25-30%.  Hemodynamics: Mean RA = 13 mmHg, mean PA = 30 mmHg, mean PCWP = 26 mmHg, LVEDP = 17 mmHg, CO = 3.66 L/min, and CI 1.8 L/min/m.  TTE performed on 09/29/2021 revealed a mildly reduced left ventricular systolic function with an EF of 45-50%.  There were no regional wall motion abnormalities.  Moderate LVH noted. Left ventricular diastolic Doppler parameters consistent with abnormal relaxation (G1DD).  There was no significant valvular regurgitation. All transvalvular gradients were noted to be normal providing no evidence suggestive of valvular stenosis.  Follow-up echocardiogram with bubble study performed on 02/24/2022 revealing improvement of EF to 60-65%.  Right ventricular size and function normal.  There was no evidence of significant valvular regurgitation or stenosis.  Agitated saline contrast bubble study was negative, suggesting no evidence of intra-atrial shunting.  Following CVA and in the setting of known PVD, patient remains on both daily oral anticoagulation (apixaban) and antithrombotic (clopidogrel) therapies.  Patient reported compliant with therapies with no evidence or reports of GI bleeding.  Blood pressure well-controlled at 96/61 mmHg on currently prescribed  beta-blocker (carvedilol) and diuretic (torsemide) therapies. Previously on ARB/ARNi Sherryll Burger), however this intervention was previously discontinued due to  associated hypotension. Patient is on atorvastatin for HLD diagnosis and further ASCVD prevention.  T2DM reasonably controlled on currently prescribed regimen; last HgbA1c was 7.8% when checked on 04/22/2022.  Patient does not have an OSAH diagnosis, however she does require the use of supplemental oxygen at night.  Of note, son accompanied patient to most recent cardiology visit.  He advised that patient needed to wear oxygen at all times, especially when out and about.  Functional capacity limited by patient's multiple medical comorbidities and overall respiratory status.  With that being said, patient still felt to be able to achieve at least 4 METS of physical activity without experiencing any significant degree of angina/anginal equivalent symptoms.  No changes were made to her medication regimen.  Patient to follow-up with outpatient cardiology in 4 to 5 months or sooner if needed.  Jocelyn Wells is scheduled for a ARTERIOVENOUS (AV) FISTULA CREATION (BRACHIALCEPHALIC) (Left) on 06/18/2022 with Dr. Levora Dredge, MD.  Given patient's past medical history significant for cardiovascular diagnoses, presurgical cardiac clearance was sought by the PAT team. Per cardiology, "based ACC/AHA guidelines, the patient's past medical history, and the amount of time since her last clinic visit, this patient would be at an overall ACCEPTABLE risk for the planned procedure without further cardiovascular testing or intervention at this time".   Again, this patient is on both daily anticoagulation and antithrombotic therapies.  She has been instructed on recommendations from cardiology and vascular surgery for holding her apixaban dose for 3 days prior to her procedure with plans to restart since postoperatively respectively minimized by her primary attending  surgeon.  The patient is aware that her last dose of apixaban should be on 06/14/2022.  Given her cardiovascular history, attending surgeon has advised that it would be acceptable for patient to continue her clopidogrel dose throughout her perioperative course.  Patient reports previous perioperative complications with anesthesia in the past. Patient has a PMH (+) for PONV. Symptoms and history of PONV will be discussed with patient by anesthesia team on the day of her procedure. Interventions will be ordered as deemed necessary based on patient's individual care needs as determined by anesthesiologist.  Additionally, patient reporting history of (+) awareness under general anesthesia. In review of the available records, it is noted that patient underwent a general anesthetic course here at [facility] ([facility]) in 02/2022 without documented complications.      06/14/2022    1:25 PM 06/09/2022    1:13 PM 06/07/2022    9:10 AM  Vitals with BMI  Height 5\' 6"  5\' 6"  5\' 6"   Weight 198 lbs 209 lbs 211 lbs 3 oz  BMI 31.97 33.75 34.1  Systolic 155 134 161  Diastolic 81 72 76  Pulse 71 64 71    Providers/Specialists:   NOTE: Primary physician provider listed below. Patient may have been seen by APP or partner within same practice.   PROVIDER ROLE / SPECIALTY LAST OV  Schnier, Latina Craver, MD Vascular Surgery (Surgeon) 06/14/2022  Jacky Kindle, FNP Primary Care Provider 06/01/2022  Debbe Odea, MD Cardiology 02/08/2022  Clarisa Kindred, FNP-C Advanced Heart Failure 05/05/2022  Owens Shark, MD Medical Oncology/Hematology 05/10/2022  Edgardo Roys, MD Pain Management 06/09/2022   Allergies:  Codeine and Ivp dye [iodinated contrast media]  Current Home Medications:   No current facility-administered medications for this encounter.    albuterol (VENTOLIN HFA) 108 (90 Base) MCG/ACT inhaler   apixaban (ELIQUIS) 5 MG TABS tablet  atorvastatin (LIPITOR) 40 MG  tablet   buPROPion (WELLBUTRIN SR) 150 MG 12 hr tablet   calcitRIOL (ROCALTROL) 0.25 MCG capsule   carvedilol (COREG) 25 MG tablet   clopidogrel (PLAVIX) 75 MG tablet   Dulaglutide 3 MG/0.5ML SOPN   fluticasone (FLONASE) 50 MCG/ACT nasal spray   gabapentin (NEURONTIN) 300 MG capsule   glucose blood test strip   insulin aspart (NOVOLOG) 100 UNIT/ML injection   Insulin Disposable Pump (OMNIPOD DASH PODS, GEN 4,) MISC   Insulin Human (INSULIN PUMP) SOLN   JARDIANCE 10 MG TABS tablet   loratadine (CLARITIN) 10 MG tablet   meclizine (ANTIVERT) 12.5 MG tablet   OXYGEN   PARoxetine (PAXIL) 40 MG tablet   Spacer/Aero-Holding Chambers (OPTICHAMBER DIAMOND) MISC   torsemide (DEMADEX) 20 MG tablet   traMADol (ULTRAM) 50 MG tablet   Vitamin D, Ergocalciferol, (DRISDOL) 1.25 MG (50000 UNIT) CAPS capsule   History:   Past Medical History:  Diagnosis Date   Acute ischemic left MCA stroke (HCC) 01/29/2015   a.) MRI brain 01/29/2015: acute left MCA territory infarcts involving the posterior left frontal lobe and left insular cortex   Anemia of chronic renal failure    Anxiety    Arthritis    Complication of anesthesia    a.) PONV; b.) awareness under anesthesia   COPD (chronic obstructive pulmonary disease) (HCC)    Depression    Dyspnea    ESRD (end stage renal disease) (HCC)    Headache    HFrEF (heart failure with reduced ejection fraction) (HCC)    a.) TTE 01/30/2015: EF 30-35%, diff HK, LAE, G2DD; b.) TTE 06/13/2015: EF 40-45%, diff HK, G1DD; c.) TTE 10/06/2017: EF 60-65% MAC, no IAS; d.) TTE 09/29/2021: EF 45-50%;, LVH, LAE, AoV sclerosis, G1DD; e.) TTE 02/24/2022: EF 60-65%, no IAS   HTN (hypertension)    Leukocytosis    Long term current use of anticoagulant    a.) apixaban   Long term current use of antithrombotics/antiplatelets    a.) clopidogrel   NICM (nonischemic cardiomyopathy) (HCC)    a.) TTE 01/30/2015: EF 30-35%; b.) R/LHC 03/25/2015: EF 25-30%, mRA 13, mPA 30, mPCWP  26, LVEDP 17, CO 3.66, CI 1.8; c.) TTE 06/13/2015: EF 40-45%; d.) TTE 10/06/2017: EF 60-65%; e.) TTE 09/29/2021: EF 45-50%; f.) TTE 02/24/2022: EF 60-65%   Obesity    On supplemental oxygen by nasal cannula    a.) 2L/[place] at night   Osteoporosis    Peripheral vascular disease (HCC)    Type 2 diabetes mellitus treated with insulin [facility])    Past Surgical History:  Procedure Laterality Date   ABDOMINAL HYSTERECTOMY     ANKLE FRACTURE SURGERY Left 02/09/2000   BILATERAL SALPINGOOPHORECTOMY  02/08/1998   CARDIAC CATHETERIZATION N/A 03/25/2015   Procedure: Right/Left Heart Cath and Coronary Angiography;  Surgeon: Lyn Records, MD;  Location: [facility] INVASIVE CV LAB;  Service: Cardiovascular;  Laterality: N/A;   CESAREAN SECTION  02/09/1976   COLONOSCOPY WITH PROPOFOL N/A 09/22/2016   Procedure: COLONOSCOPY WITH PROPOFOL;  Surgeon: Scot Jun, MD;  Location: [facility] ENDOSCOPY;  Service: Endoscopy;  Laterality: N/A;   COLONOSCOPY WITH PROPOFOL N/A 02/15/2022   Procedure: COLONOSCOPY WITH PROPOFOL;  Surgeon: Toney Reil, MD;  Location: [facility] ENDOSCOPY;  Service: Gastroenterology;  Laterality: N/A;   EP IMPLANTABLE DEVICE N/A 08/05/2015   Procedure: Loop Recorder Insertion;  Surgeon: Duke Salvia, MD;  Location: [facility] INVASIVE CV LAB;  Service: Cardiovascular;  Laterality: N/A;   ESOPHAGOGASTRODUODENOSCOPY N/A 02/15/2022  Procedure: ESOPHAGOGASTRODUODENOSCOPY (EGD);  Surgeon: Toney Reil, MD;  Location: [facility] ENDOSCOPY;  Service: Gastroenterology;  Laterality: N/A;   ESOPHAGOGASTRODUODENOSCOPY (EGD) WITH PROPOFOL N/A 09/22/2016   Procedure: ESOPHAGOGASTRODUODENOSCOPY (EGD) WITH PROPOFOL;  Surgeon: Scot Jun, MD;  Location: [facility] ENDOSCOPY;  Service: Endoscopy;  Laterality: N/A;   ESOPHAGOGASTRODUODENOSCOPY (EGD) WITH PROPOFOL N/A 12/08/2016   Procedure: ESOPHAGOGASTRODUODENOSCOPY (EGD) WITH PROPOFOL;  Surgeon: Scot Jun, MD;  Location: [facility] ENDOSCOPY;  Service: Endoscopy;   Laterality: N/A;   FRACTURE SURGERY     GIVENS CAPSULE STUDY N/A 04/19/2022   Procedure: GIVENS CAPSULE STUDY;  Surgeon: Toney Reil, MD;  Location: [facility] ENDOSCOPY;  Service: Gastroenterology;  Laterality: N/A;   KNEE ARTHROSCOPY Left 02/09/2003   LOWER EXTREMITY ANGIOGRAPHY Left 08/12/2021   Procedure: Lower Extremity Angiography;  Surgeon: Renford Dills, MD;  Location: [facility] INVASIVE CV LAB;  Service: Cardiovascular;  Laterality: Left;   TEE WITHOUT CARDIOVERSION N/A 01/31/2015   Procedure: TRANSESOPHAGEAL ECHOCARDIOGRAM (TEE);  Surgeon: Lewayne Bunting, MD;  Location: [facility] ENDOSCOPY;  Service: Cardiovascular;  Laterality: N/A;   TEMPORARY DIALYSIS CATHETER N/A 08/14/2021   Procedure: TEMPORARY DIALYSIS CATHETER;  Surgeon: Renford Dills, MD;  Location: [facility] INVASIVE CV LAB;  Service: Cardiovascular;  Laterality: N/A;   TUBAL LIGATION  02/09/1976   ULNAR NERVE TRANSPOSITION  02/08/2006   Family History  Problem Relation Age of Onset   Heart disease Mother        died from CHF   Asthma Mother    Diabetes Mother    Heart disease Father    Aneurysm Father    COPD Brother    Diabetes Brother    Alcohol abuse Paternal Aunt    Cancer Maternal Grandmother        unknownorigin   Anemia Neg Hx    Arrhythmia Neg Hx    Clotting disorder Neg Hx    Fainting Neg Hx    Heart attack Neg Hx    Heart failure Neg Hx    Hyperlipidemia Neg Hx    Hypertension Neg Hx    Social History   Tobacco Use   Smoking status: Former    Packs/day: 0.50    Years: 47.00    Additional pack years: 0.00    Total pack years: 23.50    Types: Cigarettes    Start date: 02/08/1974    Quit date: 05/31/2022    Years since quitting: 0.0   Smokeless tobacco: Never  Vaping Use   Vaping Use: Never used  Substance Use Topics   Alcohol use: No    Alcohol/week: 0.0 standard drinks of alcohol   Drug use: No    Pertinent Clinical Results:  LABS:   No visits with results within 3 Day(s) from this  visit.  Latest known visit with results is:  Hospital Outpatient Visit on 06/07/2022  Component Date Value Ref Range Status   ABO/RH(D) 06/07/2022 A POS   Final   Antibody Screen 06/07/2022 NEG   Final   Sample Expiration 06/07/2022 06/21/2022,2359   Final   Extend sample reason 06/07/2022    Final                   Value:NO TRANSFUSIONS OR PREGNANCY IN THE PAST 3 MONTHS Performed at [facility], [address] [place], [place] [address]    WBC 06/07/2022 12.2 (H)  4.0 - 10.5 K/uL Final   RBC 06/07/2022 3.66 (L)  3.87 - 5.11 MIL/uL Final   Hemoglobin 06/07/2022 10.1 (L)  12.0 - 15.0 g/dL Final   HCT 30/86/5784 33.6 (L)  36.0 - 46.0 % Final   MCV 06/07/2022 91.8  80.0 - 100.0 fL Final   MCH 06/07/2022 27.6  26.0 - 34.0 pg Final   MCHC 06/07/2022 30.1  30.0 - 36.0 g/dL Final   RDW 69/62/9528 18.1 (H)  11.5 - 15.5 % Final   Platelets 06/07/2022 541 (H)  150 - 400 K/uL Final   nRBC 06/07/2022 0.0  0.0 - 0.2 % Final   Neutrophils Relative % 06/07/2022 76  % Final   Neutro Abs 06/07/2022 9.2 (H)  1.7 - 7.7 K/uL Final   Lymphocytes Relative 06/07/2022 11  % Final   Lymphs Abs 06/07/2022 1.4  0.7 - 4.0 K/uL Final   Monocytes Relative 06/07/2022 7  % Final   Monocytes Absolute 06/07/2022 0.8  0.1 - 1.0 K/uL Final   Eosinophils Relative 06/07/2022 5  % Final   Eosinophils Absolute 06/07/2022 0.6 (H)  0.0 - 0.5 K/uL Final   Basophils Relative 06/07/2022 1  % Final   Basophils Absolute 06/07/2022 0.1  0.0 - 0.1 K/uL Final   Immature Granulocytes 06/07/2022 0  % Final   Abs Immature Granulocytes 06/07/2022 0.05  0.00 - 0.07 K/uL Final   Performed at [facility], [address] [place], [place] [address]    ECG: Date: 06/07/2022 Time ECG obtained: 1006 AM Rate: 66 bpm Rhythm: normal sinus Axis (leads I and aVF): Left axis deviation Intervals: PR 208 ms. QRS 156 ms. QTc 459 ms. ST segment and T wave changes: No evidence of acute ST segment elevation or  depression Comparison: Similar to previous tracing obtained on 02/10/2022   IMAGING / PROCEDURES: ECHOCARDIOGRAM LIMITED BUBBLE STUDY performed on 02/24/2022 Left ventricular ejection fraction, by estimation, is 60 to 65%. The left ventricle has normal function.  Right ventricular systolic function is normal. The right ventricular size is normal.  The mitral valve is normal in structure. No evidence of mitral valve regurgitation.  Agitated saline contrast bubble study was negative, with no evidence of any interatrial shunt.   PULMONARY FUNCTION TESTING performed on 01/26/2022    Latest Ref Rng & Units 01/26/2022    1:45 PM  PFT Results  FVC-Pre L 1.17   FVC-Predicted Pre % 35   FVC-Post L 1.38   FVC-Predicted Post % 41   Pre FEV1/FVC % % 81   Post FEV1/FCV % % 70   FEV1-Pre L 0.95   FEV1-Predicted Pre % 37   FEV1-Post L 0.97   DLCO uncorrected ml/min/mmHg 8.99   DLCO UNC% % 42   DLVA Predicted % 61   TLC L 3.75   TLC % Predicted % 70   RV % Predicted % 98    TRANSTHORACIC ECHOCARDIOGRAM performed on 09/29/2021 Left ventricular ejection fraction, by estimation, is 45 to 50%. The left ventricle has mildly decreased function. The left ventricle has no regional wall motion abnormalities. There is moderate left ventricular hypertrophy. Left ventricular diastolic parameters are consistent with Grade I diastolic dysfunction (impaired relaxation).  Right ventricular systolic function is normal. The right ventricular size is normal.  Left atrial size was mildly dilated.  The mitral valve is normal in structure. No evidence of mitral valve regurgitation. No evidence of mitral stenosis.  The aortic valve is normal in structure. Aortic valve regurgitation is not visualized. Aortic valve sclerosis/calcification is present, without any evidence of aortic stenosis.  The inferior vena cava is normal in size with greater than  50% respiratory variability, suggesting right atrial pressure of 3  mmHg.   Impression and Plan:  Jocelyn Wells has been referred for pre-anesthesia review and clearance prior to her undergoing the planned anesthetic and procedural courses. Available labs, pertinent testing, and imaging results were personally reviewed by me in preparation for upcoming operative/procedural course. [facility] medical record has been updated following extensive record review and patient interview with PAT staff.   This patient has been appropriately cleared by cardiology with an overall ACCEPTABLE risk of significant perioperative cardiovascular complications. Based on clinical review performed today (06/17/22), barring any significant acute changes in the patient's overall condition, it is anticipated that she will be able to proceed with the planned surgical intervention. Any acute changes in clinical condition may necessitate her procedure being postponed and/or cancelled. Patient will meet with anesthesia team (MD and/or CRNA) on the day of her procedure for preoperative evaluation/assessment. Questions regarding anesthetic course will be fielded at that time.   Pre-surgical instructions were reviewed with the patient during her PAT appointment, and questions were fielded to satisfaction by PAT clinical staff. She has been instructed on which medications that she will need to hold prior to surgery, as well as the ones that have been deemed safe/appropriate to take on the day of her procedure. As part of the general education provided by PAT, patient made aware both verbally and in writing, that she would need to abstain from the use of any illegal substances during her perioperative course.  She was advised that failure to follow the provided instructions could necessitate case cancellation or result in serious perioperative complications up to and including death. Patient encouraged to contact PAT and/or her surgeon's office to discuss any questions or concerns that may arise prior to  surgery; verbalized understanding.   Quentin Mulling, MSN, APRN, FNP-C, CEN [facility]  Peri-operative Services Nurse Practitioner Phone: [phone] Fax: [phone] 06/17/22 9:54 AM  NOTE: This note has been prepared using Dragon dictation software. Despite my best ability to proofread, there is always the potential that unintentional transcriptional errors may still occur from this process.

## 2022-06-17 ENCOUNTER — Encounter: Payer: Self-pay | Admitting: Vascular Surgery

## 2022-06-17 MED ORDER — CEFAZOLIN SODIUM-DEXTROSE 2-4 GM/100ML-% IV SOLN
2.0000 g | INTRAVENOUS | Status: AC
  Administered 2022-06-18: 2 g via INTRAVENOUS

## 2022-06-17 MED ORDER — SODIUM CHLORIDE 0.9 % IV SOLN: INTRAVENOUS | Status: DC

## 2022-06-17 MED ORDER — ORAL CARE MOUTH RINSE: 15.0000 mL | Freq: Once | OROMUCOSAL | Status: AC

## 2022-06-17 MED ORDER — CHLORHEXIDINE GLUCONATE 0.12 % MT SOLN
15.0000 mL | Freq: Once | OROMUCOSAL | Status: AC
  Administered 2022-06-18: 15 mL via OROMUCOSAL

## 2022-06-17 MED ORDER — FAMOTIDINE 20 MG PO TABS
20.0000 mg | ORAL_TABLET | Freq: Once | ORAL | Status: AC
  Administered 2022-06-18: 20 mg via ORAL

## 2022-06-17 MED ORDER — CHLORHEXIDINE GLUCONATE CLOTH 2 % EX PADS
6.0000 | MEDICATED_PAD | Freq: Once | CUTANEOUS | Status: AC
  Administered 2022-06-17: 6 via TOPICAL

## 2022-06-17 MED ORDER — CHLORHEXIDINE GLUCONATE CLOTH 2 % EX PADS: 6.0000 | MEDICATED_PAD | Freq: Once | CUTANEOUS | Status: DC

## 2022-06-18 ENCOUNTER — Ambulatory Visit: Payer: Medicare Other | Admitting: Urgent Care

## 2022-06-18 ENCOUNTER — Encounter
Admission: RE | Disposition: A | Discharge status: Home / Self Care | Payer: Self-pay | Source: Ambulatory Visit | Attending: Vascular Surgery

## 2022-06-18 ENCOUNTER — Encounter (INDEPENDENT_AMBULATORY_CARE_PROVIDER_SITE_OTHER): Payer: Self-pay | Admitting: Vascular Surgery

## 2022-06-18 ENCOUNTER — Other Ambulatory Visit: Payer: Self-pay

## 2022-06-18 ENCOUNTER — Encounter: Payer: Self-pay | Admitting: Vascular Surgery

## 2022-06-18 ENCOUNTER — Ambulatory Visit
Admission: RE | Admit: 2022-06-18 | Discharge: 2022-06-18 | Disposition: A | Discharge status: Home / Self Care | Payer: Medicare Other | Source: Ambulatory Visit | Attending: Vascular Surgery | Admitting: Vascular Surgery

## 2022-06-18 DIAGNOSIS — Z95820 Peripheral vascular angioplasty status with implants and grafts: Secondary | ICD-10-CM | POA: Diagnosis not present

## 2022-06-18 DIAGNOSIS — E1151 Type 2 diabetes mellitus with diabetic peripheral angiopathy without gangrene: Secondary | ICD-10-CM | POA: Insufficient documentation

## 2022-06-18 DIAGNOSIS — I871 Compression of vein: Secondary | ICD-10-CM

## 2022-06-18 DIAGNOSIS — I5022 Chronic systolic (congestive) heart failure: Secondary | ICD-10-CM | POA: Insufficient documentation

## 2022-06-18 DIAGNOSIS — Z01812 Encounter for preprocedural laboratory examination: Secondary | ICD-10-CM

## 2022-06-18 DIAGNOSIS — D759 Disease of blood and blood-forming organs, unspecified: Secondary | ICD-10-CM | POA: Diagnosis not present

## 2022-06-18 DIAGNOSIS — I132 Hypertensive heart and chronic kidney disease with heart failure and with stage 5 chronic kidney disease, or end stage renal disease: Secondary | ICD-10-CM | POA: Diagnosis not present

## 2022-06-18 DIAGNOSIS — N186 End stage renal disease: Secondary | ICD-10-CM

## 2022-06-18 DIAGNOSIS — E1122 Type 2 diabetes mellitus with diabetic chronic kidney disease: Secondary | ICD-10-CM | POA: Diagnosis not present

## 2022-06-18 DIAGNOSIS — Z87891 Personal history of nicotine dependence: Secondary | ICD-10-CM | POA: Insufficient documentation

## 2022-06-18 DIAGNOSIS — Z9981 Dependence on supplemental oxygen: Secondary | ICD-10-CM | POA: Insufficient documentation

## 2022-06-18 DIAGNOSIS — Z8673 Personal history of transient ischemic attack (TIA), and cerebral infarction without residual deficits: Secondary | ICD-10-CM | POA: Insufficient documentation

## 2022-06-18 DIAGNOSIS — D649 Anemia, unspecified: Secondary | ICD-10-CM | POA: Insufficient documentation

## 2022-06-18 DIAGNOSIS — D631 Anemia in chronic kidney disease: Secondary | ICD-10-CM | POA: Insufficient documentation

## 2022-06-18 DIAGNOSIS — E1129 Type 2 diabetes mellitus with other diabetic kidney complication: Secondary | ICD-10-CM

## 2022-06-18 DIAGNOSIS — Z7901 Long term (current) use of anticoagulants: Secondary | ICD-10-CM | POA: Diagnosis not present

## 2022-06-18 DIAGNOSIS — J449 Chronic obstructive pulmonary disease, unspecified: Secondary | ICD-10-CM | POA: Insufficient documentation

## 2022-06-18 DIAGNOSIS — I5033 Acute on chronic diastolic (congestive) heart failure: Secondary | ICD-10-CM | POA: Diagnosis not present

## 2022-06-18 HISTORY — DX: Other specified health status: Z78.9

## 2022-06-18 HISTORY — DX: Type 2 diabetes mellitus without complications: Z79.4

## 2022-06-18 HISTORY — DX: Unspecified systolic (congestive) heart failure: I50.20

## 2022-06-18 HISTORY — DX: Long term (current) use of antithrombotics/antiplatelets: Z79.02

## 2022-06-18 HISTORY — DX: Other cardiomyopathies: I42.8

## 2022-06-18 HISTORY — PX: AV FISTULA PLACEMENT: SHX1204

## 2022-06-18 HISTORY — DX: Other complications of anesthesia, initial encounter: T88.59XA

## 2022-06-18 HISTORY — DX: Dependence on supplemental oxygen: Z99.81

## 2022-06-18 HISTORY — DX: End stage renal disease: N18.6

## 2022-06-18 HISTORY — DX: Long term (current) use of insulin: E11.9

## 2022-06-18 HISTORY — DX: Long term (current) use of anticoagulants: Z79.01

## 2022-06-18 HISTORY — DX: Anemia in chronic kidney disease: D63.1

## 2022-06-18 HISTORY — DX: Chronic kidney disease, unspecified: N18.9

## 2022-06-18 HISTORY — PX: INSERTION HYBRID ANTERIOVENOUS GORTEX GRAFT: SHX5557

## 2022-06-18 LAB — POCT I-STAT, CHEM 8
BUN: 49 mg/dL — ABNORMAL HIGH (ref 8–23)
Calcium, Ion: 0.99 mmol/L — ABNORMAL LOW (ref 1.15–1.40)
Chloride: 106 mmol/L (ref 98–111)
Creatinine, Ser: 3.6 mg/dL — ABNORMAL HIGH (ref 0.44–1.00)
Glucose, Bld: 209 mg/dL — ABNORMAL HIGH (ref 70–99)
HCT: 31 % — ABNORMAL LOW (ref 36.0–46.0)
Hemoglobin: 10.5 g/dL — ABNORMAL LOW (ref 12.0–15.0)
Potassium: 4.2 mmol/L (ref 3.5–5.1)
Sodium: 140 mmol/L (ref 135–145)
TCO2: 22 mmol/L (ref 22–32)

## 2022-06-18 LAB — GLUCOSE, CAPILLARY
Glucose-Capillary: 257 mg/dL — ABNORMAL HIGH (ref 70–99)
Glucose-Capillary: 245 mg/dL — ABNORMAL HIGH (ref 70–99)

## 2022-06-18 SURGERY — ARTERIOVENOUS (AV) FISTULA CREATION
Anesthesia: General | Laterality: Left

## 2022-06-18 MED ORDER — BUPIVACAINE HCL (PF) 0.5 % IJ SOLN
INTRAMUSCULAR | Status: AC
  Filled 2022-06-18: qty 30

## 2022-06-18 MED ORDER — PAPAVERINE HCL 30 MG/ML IJ SOLN
INTRAMUSCULAR | Status: AC
  Filled 2022-06-18: qty 2

## 2022-06-18 MED ORDER — FENTANYL CITRATE (PF) 100 MCG/2ML IJ SOLN
INTRAMUSCULAR | Status: AC
  Filled 2022-06-18: qty 2

## 2022-06-18 MED ORDER — CHLORHEXIDINE GLUCONATE 0.12 % MT SOLN
OROMUCOSAL | Status: AC
  Filled 2022-06-18: qty 15

## 2022-06-18 MED ORDER — ONDANSETRON HCL 4 MG/2ML IJ SOLN
INTRAMUSCULAR | Status: DC | PRN
  Administered 2022-06-18: 4 mg via INTRAVENOUS

## 2022-06-18 MED ORDER — BUPIVACAINE LIPOSOME 1.3 % IJ SUSP
INTRAMUSCULAR | Status: DC | PRN
  Administered 2022-06-18: 40 mL via INTRAMUSCULAR

## 2022-06-18 MED ORDER — DEXAMETHASONE SODIUM PHOSPHATE 10 MG/ML IJ SOLN
INTRAMUSCULAR | Status: DC | PRN
  Administered 2022-06-18: 5 mg via INTRAVENOUS

## 2022-06-18 MED ORDER — ONDANSETRON HCL 4 MG/2ML IJ SOLN: 4.0000 mg | Freq: Once | INTRAMUSCULAR | Status: DC | PRN

## 2022-06-18 MED ORDER — CEFAZOLIN SODIUM-DEXTROSE 2-4 GM/100ML-% IV SOLN
INTRAVENOUS | Status: AC
  Filled 2022-06-18: qty 100

## 2022-06-18 MED ORDER — PHENYLEPHRINE HCL (PRESSORS) 10 MG/ML IV SOLN
INTRAVENOUS | Status: DC | PRN
  Administered 2022-06-18 (×4): 80 ug via INTRAVENOUS
  Administered 2022-06-18: 160 ug via INTRAVENOUS

## 2022-06-18 MED ORDER — HEPARIN SODIUM (PORCINE) 5000 UNIT/ML IJ SOLN
INTRAMUSCULAR | Status: AC
  Filled 2022-06-18: qty 1

## 2022-06-18 MED ORDER — MIDAZOLAM HCL 2 MG/2ML IJ SOLN
INTRAMUSCULAR | Status: DC | PRN
  Administered 2022-06-18 (×2): 1 mg via INTRAVENOUS

## 2022-06-18 MED ORDER — FAMOTIDINE 20 MG PO TABS
ORAL_TABLET | ORAL | Status: AC
  Filled 2022-06-18: qty 1

## 2022-06-18 MED ORDER — BUPIVACAINE LIPOSOME 1.3 % IJ SUSP
INTRAMUSCULAR | Status: AC
  Filled 2022-06-18: qty 20

## 2022-06-18 MED ORDER — PROPOFOL 1000 MG/100ML IV EMUL
INTRAVENOUS | Status: AC
  Filled 2022-06-18: qty 100

## 2022-06-18 MED ORDER — FENTANYL CITRATE (PF) 100 MCG/2ML IJ SOLN: 25.0000 ug | INTRAMUSCULAR | Status: DC | PRN

## 2022-06-18 MED ORDER — SODIUM CHLORIDE 0.9 % IR SOLN: Status: DC | PRN

## 2022-06-18 MED ORDER — LACTATED RINGERS IV SOLN: INTRAVENOUS | Status: DC

## 2022-06-18 MED ORDER — PROPOFOL 10 MG/ML IV BOLUS
INTRAVENOUS | Status: DC | PRN
  Administered 2022-06-18: 100 mg via INTRAVENOUS
  Administered 2022-06-18: 30 mg via INTRAVENOUS

## 2022-06-18 MED ORDER — INSULIN ASPART 100 UNIT/ML IJ SOLN
5.0000 [IU] | Freq: Once | INTRAMUSCULAR | Status: AC
  Administered 2022-06-18: 5 [IU] via INTRAVENOUS

## 2022-06-18 MED ORDER — PHENYLEPHRINE HCL-NACL 20-0.9 MG/250ML-% IV SOLN
INTRAVENOUS | Status: DC | PRN
  Administered 2022-06-18: 20 ug/min via INTRAVENOUS

## 2022-06-18 MED ORDER — MIDAZOLAM HCL 2 MG/2ML IJ SOLN
INTRAMUSCULAR | Status: AC
  Filled 2022-06-18: qty 2

## 2022-06-18 MED ORDER — OXYCODONE HCL 5 MG/5ML PO SOLN: 5.0000 mg | Freq: Once | ORAL | Status: DC | PRN

## 2022-06-18 MED ORDER — HEMOSTATIC AGENTS (NO CHARGE) OPTIME
TOPICAL | Status: DC | PRN
  Administered 2022-06-18: 1 via TOPICAL

## 2022-06-18 MED ORDER — MICROFIBRILLAR COLL HEMOSTAT EX PADS: MEDICATED_PAD | CUTANEOUS | Status: DC | PRN

## 2022-06-18 MED ORDER — FENTANYL CITRATE (PF) 100 MCG/2ML IJ SOLN
INTRAMUSCULAR | Status: DC | PRN
  Administered 2022-06-18 (×4): 25 ug via INTRAVENOUS

## 2022-06-18 MED ORDER — ACETAMINOPHEN 10 MG/ML IV SOLN: 1000.0000 mg | Freq: Once | INTRAVENOUS | Status: DC | PRN

## 2022-06-18 MED ORDER — EPHEDRINE SULFATE (PRESSORS) 50 MG/ML IJ SOLN
INTRAMUSCULAR | Status: DC | PRN
  Administered 2022-06-18: 5 mg via INTRAVENOUS
  Administered 2022-06-18: 10 mg via INTRAVENOUS
  Administered 2022-06-18: 5 mg via INTRAVENOUS

## 2022-06-18 MED ORDER — ACETAMINOPHEN 10 MG/ML IV SOLN
INTRAVENOUS | Status: DC | PRN
  Administered 2022-06-18: 1000 mg via INTRAVENOUS

## 2022-06-18 MED ORDER — INSULIN ASPART 100 UNIT/ML IJ SOLN
INTRAMUSCULAR | Status: AC
  Filled 2022-06-18: qty 1

## 2022-06-18 MED ORDER — OXYCODONE HCL 5 MG PO TABS: 5.0000 mg | ORAL_TABLET | Freq: Once | ORAL | Status: DC | PRN

## 2022-06-18 MED ORDER — OXYCODONE-ACETAMINOPHEN 5-325 MG PO TABS: 1.0000 | ORAL_TABLET | Freq: Four times a day (QID) | ORAL | 0 refills | Status: DC | PRN

## 2022-06-18 MED ORDER — LIDOCAINE HCL (CARDIAC) PF 100 MG/5ML IV SOSY
PREFILLED_SYRINGE | INTRAVENOUS | Status: DC | PRN
  Administered 2022-06-18: 100 mg via INTRAVENOUS

## 2022-06-18 SURGICAL SUPPLY — 66 items
ADH SKN CLS APL DERMABOND .7 (GAUZE/BANDAGES/DRESSINGS) ×4
AGENT HMST PWDR HDRLZ BVN CLGN (Miscellaneous) ×2 IMPLANT
APL PRP STRL LF DISP 70% ISPRP (MISCELLANEOUS) ×2
APPLIER CLIP 11 MED OPEN (CLIP)
APPLIER CLIP 9.375 SM OPEN (CLIP)
APR CLP MED 11 20 MLT OPN (CLIP)
APR CLP SM 9.3 20 MLT OPN (CLIP)
BAG DECANTER FOR FLEXI CONT (MISCELLANEOUS) ×2 IMPLANT
BLADE SURG SZ11 CARB STEEL (BLADE) ×2 IMPLANT
BOOT SUTURE VASCULAR YLW (MISCELLANEOUS) ×2
BRUSH SCRUB EZ  4% CHG (MISCELLANEOUS) ×2
BRUSH SCRUB EZ 4% CHG (MISCELLANEOUS) ×2 IMPLANT
CHLORAPREP W/TINT 26 (MISCELLANEOUS) ×2 IMPLANT
CLAMP SUTURE YELLOW 5 PAIRS (MISCELLANEOUS) ×2 IMPLANT
CLIP APPLIE 11 MED OPEN (CLIP) IMPLANT
CLIP APPLIE 9.375 SM OPEN (CLIP) IMPLANT
COLLAGEN CELLERATERX 1 GRAM (Miscellaneous) IMPLANT
DERMABOND ADVANCED .7 DNX12 (GAUZE/BANDAGES/DRESSINGS) ×2 IMPLANT
DRESSING SURGICEL FIBRLLR 1X2 (HEMOSTASIS) ×2 IMPLANT
DRSG SURGICEL FIBRILLAR 1X2 (HEMOSTASIS) ×2
DRSG TEGADERM 4X4.75 (GAUZE/BANDAGES/DRESSINGS) IMPLANT
ELECT CAUTERY BLADE 6.4 (BLADE) ×2 IMPLANT
ELECT REM PT RETURN 9FT ADLT (ELECTROSURGICAL) ×2
ELECTRODE REM PT RTRN 9FT ADLT (ELECTROSURGICAL) ×2 IMPLANT
GEL ULTRASOUND 20GR AQUASONIC (MISCELLANEOUS) IMPLANT
GLOVE BIO SURGEON STRL SZ7 (GLOVE) ×2 IMPLANT
GLOVE SURG SYN 8.0 (GLOVE) ×2 IMPLANT
GLOVE SURG SYN 8.0 PF PI (GLOVE) ×2 IMPLANT
GOWN STRL REUS W/ TWL LRG LVL3 (GOWN DISPOSABLE) ×4 IMPLANT
GOWN STRL REUS W/ TWL XL LVL3 (GOWN DISPOSABLE) ×2 IMPLANT
GOWN STRL REUS W/TWL LRG LVL3 (GOWN DISPOSABLE) ×4
GOWN STRL REUS W/TWL XL LVL3 (GOWN DISPOSABLE) ×2
GRAFT PROPATEN STD WALL 4 7X45 (Vascular Products) IMPLANT
IV NS 500ML (IV SOLUTION) ×2
IV NS 500ML BAXH (IV SOLUTION) ×2 IMPLANT
KIT TURNOVER KIT A (KITS) ×2 IMPLANT
LABEL OR SOLS (LABEL) ×2 IMPLANT
LOOP VESSEL MAXI  1X406 RED (MISCELLANEOUS) ×2
LOOP VESSEL MAXI 1X406 RED (MISCELLANEOUS) ×2 IMPLANT
LOOP VESSEL MINI 0.8X406 BLUE (MISCELLANEOUS) ×4 IMPLANT
MANIFOLD NEPTUNE II (INSTRUMENTS) ×2 IMPLANT
NDL FILTER BLUNT 18X1 1/2 (NEEDLE) ×2 IMPLANT
NDL HYPO 30X.5 LL (NEEDLE) IMPLANT
NEEDLE FILTER BLUNT 18X1 1/2 (NEEDLE) ×2 IMPLANT
NEEDLE HYPO 30X.5 LL (NEEDLE) IMPLANT
PACK EXTREMITY ARMC (MISCELLANEOUS) ×2 IMPLANT
PAD PREP OB/GYN DISP 24X41 (PERSONAL CARE ITEMS) ×2 IMPLANT
STOCKINETTE 48X4 2 PLY STRL (GAUZE/BANDAGES/DRESSINGS) ×2 IMPLANT
STOCKINETTE STRL 4IN 9604848 (GAUZE/BANDAGES/DRESSINGS) ×2 IMPLANT
SUT GORETEX CV-6TTC-13 36IN (SUTURE) IMPLANT
SUT MNCRL+ 5-0 UNDYED PC-3 (SUTURE) ×2 IMPLANT
SUT PROLENE 6 0 BV (SUTURE) ×8 IMPLANT
SUT SILK 2 0 (SUTURE) ×2
SUT SILK 2 0 SH (SUTURE) IMPLANT
SUT SILK 2-0 18XBRD TIE 12 (SUTURE) ×2 IMPLANT
SUT SILK 3 0 (SUTURE) ×2
SUT SILK 3-0 18XBRD TIE 12 (SUTURE) ×2 IMPLANT
SUT SILK 4 0 (SUTURE) ×2
SUT SILK 4-0 18XBRD TIE 12 (SUTURE) ×2 IMPLANT
SUT VIC AB 3-0 SH 27 (SUTURE) ×2
SUT VIC AB 3-0 SH 27X BRD (SUTURE) ×2 IMPLANT
SYR 20ML LL LF (SYRINGE) ×2 IMPLANT
SYR 3ML LL SCALE MARK (SYRINGE) ×2 IMPLANT
TAG SUTURE CLAMP YLW 5PR (MISCELLANEOUS) ×2
TRAP FLUID SMOKE EVACUATOR (MISCELLANEOUS) ×2 IMPLANT
WATER STERILE IRR 500ML POUR (IV SOLUTION) ×2 IMPLANT

## 2022-06-18 NOTE — Anesthesia Postprocedure Evaluation (Signed)
Anesthesia Post Note  Patient: Jocelyn Wells  Procedure(s) Performed: ARTERIOVENOUS (AV) FISTULA CREATION (BRACHIAL AXILLA) (Left) INSERTION HYBRID ANTERIOVENOUS GORTEX GRAFT  Patient location during evaluation: PACU Anesthesia Type: General Level of consciousness: awake and alert, oriented and patient cooperative Pain management: pain level controlled Vital Signs Assessment: post-procedure vital signs reviewed and stable Respiratory status: spontaneous breathing, nonlabored ventilation and respiratory function stable Cardiovascular status: blood pressure returned to baseline and stable Postop Assessment: adequate PO intake Anesthetic complications: no   No notable events documented.   Last Vitals:  Vitals:   06/18/22 1115 06/18/22 1125  BP: (!) 117/54 (!) 116/55  Pulse: 70 73  Resp: (!) 27 20  Temp: (!) 36.2 C (!) 36.1 C  SpO2: 92% 94%    Last Pain:  Vitals:   06/18/22 1125  TempSrc: Temporal  PainSc: 0-No pain                 Reed Breech

## 2022-06-18 NOTE — Anesthesia Preprocedure Evaluation (Addendum)
Anesthesia Evaluation  Patient identified by MRN, date of birth, ID band Patient awake    Reviewed: Allergy & Precautions, NPO status , Patient's Chart, lab work & pertinent test results  History of Anesthesia Complications (+) PONV and history of anesthetic complications  Airway Mallampati: II   Neck ROM: Full    Dental  (+) Edentulous Upper, Edentulous Lower   Pulmonary COPD (on 2L home O2 at night),  oxygen dependent, Current Smoker and Patient abstained from smoking., former smoker   Pulmonary exam normal breath sounds clear to auscultation       Cardiovascular hypertension, + Peripheral Vascular Disease (s/p LE stents on Eliquis and Plavix) and +CHF (NICM)  Normal cardiovascular exam Rhythm:Regular Rate:Normal  ECG 06/07/22: Normal sinus rhythm Left axis deviation Left ventricular hypertrophy with QRS widening ( R in aVL , Cornell product )  Echo 02/24/22:  1. Left ventricular ejection fraction, by estimation, is 60 to 65%. The left ventricle has normal function.  2. Right ventricular systolic function is normal. The right ventricular size is normal.  3. The mitral valve is normal in structure. No evidence of mitral valve regurgitation.  4. Agitated saline contrast bubble study was negative, with no evidence of any interatrial shunt.    Neuro/Psych  PSYCHIATRIC DISORDERS Anxiety Depression    CVA (2016; uses walker)    GI/Hepatic negative GI ROS,,,  Endo/Other  diabetes, Type 2    Renal/GU ESRFRenal disease     Musculoskeletal   Abdominal   Peds  Hematology  (+) Blood dyscrasia, anemia   Anesthesia Other Findings Reviewed and agree with Edd Fabian pre-anesthesia clinical review note.   Reproductive/Obstetrics                             Anesthesia Physical Anesthesia Plan  ASA: 3  Anesthesia Plan: General   Post-op Pain Management:    Induction: Intravenous  PONV Risk Score  and Plan: 3 and Ondansetron, Dexamethasone and Treatment may vary due to age or medical condition  Airway Management Planned: LMA  Additional Equipment:   Intra-op Plan:   Post-operative Plan: Extubation in OR  Informed Consent: I have reviewed the patients History and Physical, chart, labs and discussed the procedure including the risks, benefits and alternatives for the proposed anesthesia with the patient or authorized representative who has indicated his/her understanding and acceptance.     Dental advisory given  Plan Discussed with: CRNA  Anesthesia Plan Comments: (Patient consented for risks of anesthesia including but not limited to:  - adverse reactions to medications - damage to eyes, teeth, lips or other oral mucosa - nerve damage due to positioning  - sore throat or hoarseness - damage to heart, brain, nerves, lungs, other parts of body or loss of life  Informed patient about role of CRNA in peri- and intra-operative care.  Patient voiced understanding.)        Anesthesia Quick Evaluation

## 2022-06-18 NOTE — Transfer of Care (Signed)
Immediate Anesthesia Transfer of Care Note  Patient: Christol A Ramseyer  Procedure(s) Performed: ARTERIOVENOUS (AV) FISTULA CREATION (BRACHIAL AXILLA) (Left) INSERTION HYBRID ANTERIOVENOUS GORTEX GRAFT  Patient Location: PACU  Anesthesia Type:General  Level of Consciousness: awake and drowsy  Airway & Oxygen Therapy: Patient Spontanous Breathing and Patient connected to face mask oxygen  Post-op Assessment: Report given to RN and Post -op Vital signs reviewed and unstable, Anesthesiologist notified  Post vital signs: Reviewed and stable  Last Vitals:  Vitals Value Taken Time  BP 107/76 06/18/22 1015  Temp    Pulse 70 06/18/22 1020  Resp 17 06/18/22 1020  SpO2 97 % 06/18/22 1020  Vitals shown include unvalidated device data.  Last Pain:  Vitals:   06/18/22 0619  TempSrc: Temporal  PainSc: 0-No pain      Patients Stated Pain Goal: 0 (06/18/22 1610)  Complications: No notable events documented.

## 2022-06-18 NOTE — Interval H&P Note (Signed)
History and Physical Interval Note:  06/18/2022 7:26 AM  Jocelyn Wells  has presented today for surgery, with the diagnosis of ESRD.  The various methods of treatment have been discussed with the patient and family. After consideration of risks, benefits and other options for treatment, the patient has consented to  Procedure(s): ARTERIOVENOUS (AV) FISTULA CREATION (BRACHIALCEPHALIC) (Left) as a surgical intervention.  The patient's history has been reviewed, patient examined, no change in status, stable for surgery.  I have reviewed the patient's chart and labs.  Questions were answered to the patient's satisfaction.     Levora Dredge

## 2022-06-18 NOTE — Discharge Instructions (Signed)

## 2022-06-18 NOTE — Op Note (Signed)
OPERATIVE NOTE   PROCEDURE:  Left brachial axillary arteriovenous graft placement. 2.    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 female who presents with end stage renal disease.  The patient is scheduled for left brachial cephalic AV fistula placement.  The patient is aware the risks include but are not limited to: bleeding, infection, steal syndrome, nerve damage, ischemic monomelic neuropathy, failure to mature, and need for additional procedures.  The patient is aware of the risks of the procedure and elects to proceed forward.  DESCRIPTION: After full informed written consent was obtained from the patient, the patient was brought back to the operating room and placed supine upon the operating table.  Prior to induction, the patient received IV antibiotics.   After obtaining adequate anesthesia, the patient was then prepped and draped in the standard fashion for a left arm access procedure.    A curvilinear incision was then created across the antecubital crease and the brachial artery which was exposed as well as the cephalic vein. The brachial artery was then looped proximally and distally with Silastic Vesseloops. Side branches were controlled with 4-0 silk ties.  The cephalic vein was dissected more distally and then ligated and divided with a 2-0 silk tie.  Using [facility] coronary dilators a 2 mm dilator would not pass at a level just proximal to the incision.  Inspection of the vein demonstrated a chronic occlusion likely from the previous phlebitis this rendered the  vein 9 usable and it was ligated at the level of the biceps with a 2-0 silk tie.  I then elected to move forward with placement of a left arm brachial axillary AV graft.  Attention was then turned to the exposure of the axillary vein. Linear incision was then created medial to the proximal portion of the biceps at the level of the anterior axillary crease. The axillary vein was exposed and again looped proximally and distally with Silastic vessel loops. Associated tributaries were also controlled with Silastic Vesseloops.  The Gore tunneler was then delivered onto the field and a subcutaneous path was made from the arterial incision to the venous incision. A 4-7 tapered PTFE propatent graft by Emeline Darling was then pulled through the subcutaneous tunnel. The arterial 4 mm portion was then approximated to the brachial artery. Brachial artery was controlled proximally and distally with the Silastic Vesseloops. Arteriotomy was made with an 11 blade scalpel and extended with Potts scissors and a 6-0 Prolene stay suture was placed. End graft to side brachial artery anastomosis was then fashioned with running CV 6 suture. Flushing maneuvers were performed suture line was hemostatic and the graft was then assessed for proper position and ease of future cannulation. Heparinized saline was infused into the vein and the graft was clamped with a vascular clamp. With the graft pressurized it was approximated to the axillary vein in its native bed and then marked with a surgical marker. The vein was then delivered into the surgical field and controlled with the Silastic vessel loops. Venotomy was then made with an 11 blade scalpel and extended with Windle Guard  scissors and a 6-0 Prolene suture was used as stay suture. The the graft was then sewn to the vein in an end graft to side vein fashion using running CV 6 suture.  Flushing maneuvers were performed and the artery was allowed to forward and back bleed.  Flow was then established  through the AV graft  There was good  thrill in the venous outflow, and there was 1+ palpable radial pulse.  At this point, I irrigated out the surgical wounds.  There was no further active bleeding.  Cellerate was placed in multiple levels.  Marcaine with Exparel was then infiltrated in soft tissues.  The subcutaneous tissue was reapproximated with a running stitch of 3-0 Vicryl.  The skin was then reapproximated with a running subcuticular stitch of 4-0 Vicryl.  The skin was then cleaned, dried, and reinforced with Dermabond.    The patient tolerated this procedure well.   COMPLICATIONS: None  CONDITION: [name] [facility]  Office: [phone]   06/18/2022, 10:14 AM

## 2022-06-18 NOTE — Anesthesia Procedure Notes (Signed)
Procedure Name: LMA Insertion Date/Time: 06/18/2022 7:39 AM  Performed by: Cheral Bay, CRNAPre-anesthesia Checklist: Patient identified, Emergency Drugs available, Suction available and Patient being monitored Patient Re-evaluated:Patient Re-evaluated prior to induction Oxygen Delivery Method: Circle system utilized Preoxygenation: Pre-oxygenation with 100% oxygen Induction Type: IV induction Ventilation: Mask ventilation without difficulty LMA: LMA inserted LMA Size: 4.0 Tube type: Oral Number of attempts: 1 Placement Confirmation: positive ETCO2 and breath sounds checked- equal and bilateral Tube secured with: Tape Dental Injury: Teeth and Oropharynx as per pre-operative assessment

## 2022-06-21 ENCOUNTER — Encounter: Payer: Self-pay | Admitting: Vascular Surgery

## 2022-07-01 ENCOUNTER — Ambulatory Visit: Payer: Medicare Other | Admitting: Podiatry

## 2022-07-01 DIAGNOSIS — H2513 Age-related nuclear cataract, bilateral: Secondary | ICD-10-CM | POA: Diagnosis not present

## 2022-07-01 DIAGNOSIS — H2512 Age-related nuclear cataract, left eye: Secondary | ICD-10-CM | POA: Diagnosis not present

## 2022-07-01 DIAGNOSIS — E113513 Type 2 diabetes mellitus with proliferative diabetic retinopathy with macular edema, bilateral: Secondary | ICD-10-CM | POA: Diagnosis not present

## 2022-07-01 DIAGNOSIS — H18413 Arcus senilis, bilateral: Secondary | ICD-10-CM | POA: Diagnosis not present

## 2022-07-01 DIAGNOSIS — H25013 Cortical age-related cataract, bilateral: Secondary | ICD-10-CM | POA: Diagnosis not present

## 2022-07-01 DIAGNOSIS — H4313 Vitreous hemorrhage, bilateral: Secondary | ICD-10-CM | POA: Diagnosis not present

## 2022-07-08 ENCOUNTER — Ambulatory Visit: Payer: Medicare Other | Admitting: Cardiology

## 2022-07-09 ENCOUNTER — Encounter: Payer: Self-pay | Admitting: Cardiology

## 2022-07-09 ENCOUNTER — Ambulatory Visit: Payer: Medicare Other | Attending: Cardiology | Admitting: Cardiology

## 2022-07-09 ENCOUNTER — Other Ambulatory Visit: Payer: Self-pay

## 2022-07-09 VITALS — BP 98/56 | HR 75 | Ht 66.0 in | Wt 230.0 lb

## 2022-07-09 DIAGNOSIS — Z79899 Other long term (current) drug therapy: Secondary | ICD-10-CM | POA: Diagnosis not present

## 2022-07-09 DIAGNOSIS — I5032 Chronic diastolic (congestive) heart failure: Secondary | ICD-10-CM | POA: Diagnosis not present

## 2022-07-09 DIAGNOSIS — I428 Other cardiomyopathies: Secondary | ICD-10-CM | POA: Insufficient documentation

## 2022-07-09 DIAGNOSIS — I959 Hypotension, unspecified: Secondary | ICD-10-CM | POA: Insufficient documentation

## 2022-07-09 MED ORDER — TORSEMIDE 20 MG PO TABS: ORAL_TABLET | ORAL | 1 refills | Status: DC

## 2022-07-09 NOTE — Progress Notes (Signed)
Cardiology Office Note:    Date:  07/09/2022   ID:  Jocelyn Wells, DOB [birth year], MRN [id number]  PCP:  Jacky Kindle, FNP   [facility] Providers Cardiologist:  None     Referring MD: Jacky Kindle, FNP   Chief Complaint  Patient presents with   Follow-up    Patient is concerned of decreasing blood pressure this week, 98/56 in office today.  New nosebleeds stared a couple weeks ago.  With last nosebleed episode on 3 days ago that last 2-3 hours.      History of Present Illness:    Jocelyn Wells is a 67 y.o. female with a hx of anxiety, CHF (initial EF 40 to 45%-2017, normalized to 60 to 65%-2019) paroxysmal atrial fibrillation, hypertension, diabetes, former smoker x40+ years, COPD, CVA due to thrombosis of left middle cerebral artery s/p ILR 2017 , DVT on Eliquis, CKD, lumbar spinal stenosis causing back pain presenting for follow-up.  Previously seen due to cardiomyopathy and leg edema.  Has been having low blood pressures, Entresto was stopped.  Currently takes Coreg 12.5 mg twice daily.  Systolic blood pressures at home runs around 80s.  States feeling dizzy, occasional falls.  Takes Eliquis for DVT.  Also on Plavix, complains of easy bruisability.  Not sure why she takes Plavix.  Has an AV fistula placed in her left arm, dialysis is being considered in the near future.  Complains of leg edema, takes torsemide 2 times weekly.  Echocardiogram 02/2022 EF 55 to 60%  Echocardiogram 2019 EF 60 to 65% Echocardiogram 2017 EF 40 to 45% Left heart cath 2017 normal coronary arteries.  Past Medical History:  Diagnosis Date   Acute ischemic left MCA stroke (HCC) 01/29/2015   a.) MRI brain 01/29/2015: acute left MCA territory infarcts involving the posterior left frontal lobe and left insular cortex   Anemia of chronic renal failure    Anxiety    Arthritis    Complication of anesthesia    a.) PONV; b.) awareness under anesthesia   COPD (chronic obstructive pulmonary disease)  (HCC)    Depression    Dyspnea    ESRD (end stage renal disease) (HCC)    Headache    HFrEF (heart failure with reduced ejection fraction) (HCC)    a.) TTE 01/30/2015: EF 30-35%, diff HK, LAE, G2DD; b.) TTE 06/13/2015: EF 40-45%, diff HK, G1DD; c.) TTE 10/06/2017: EF 60-65% MAC, no IAS; d.) TTE 09/29/2021: EF 45-50%;, LVH, LAE, AoV sclerosis, G1DD; e.) TTE 02/24/2022: EF 60-65%, no IAS   HTN (hypertension)    Leukocytosis    Long term current use of anticoagulant    a.) apixaban   Long term current use of antithrombotics/antiplatelets    a.) clopidogrel   NICM (nonischemic cardiomyopathy) (HCC)    a.) TTE 01/30/2015: EF 30-35%; b.) R/LHC 03/25/2015: EF 25-30%, mRA 13, mPA 30, mPCWP 26, LVEDP 17, CO 3.66, CI 1.8; c.) TTE 06/13/2015: EF 40-45%; d.) TTE 10/06/2017: EF 60-65%; e.) TTE 09/29/2021: EF 45-50%; f.) TTE 02/24/2022: EF 60-65%   Obesity    On supplemental oxygen by nasal cannula    a.) 2L/[place] at night   Osteoporosis    Peripheral vascular disease (HCC)    Type 2 diabetes mellitus treated with insulin [facility])     Past Surgical History:  Procedure Laterality Date   ABDOMINAL HYSTERECTOMY     ANKLE FRACTURE SURGERY Left 02/09/2000   AV FISTULA PLACEMENT Left 06/18/2022   Procedure: ARTERIOVENOUS (AV) FISTULA CREATION (BRACHIAL  AXILLA);  Surgeon: Renford Dills, MD;  Location: [facility] ORS;  Service: Vascular;  Laterality: Left;   BILATERAL SALPINGOOPHORECTOMY  02/08/1998   CARDIAC CATHETERIZATION N/A 03/25/2015   Procedure: Right/Left Heart Cath and Coronary Angiography;  Surgeon: Lyn Records, MD;  Location: [facility] INVASIVE CV LAB;  Service: Cardiovascular;  Laterality: N/A;   CESAREAN SECTION  02/09/1976   COLONOSCOPY WITH PROPOFOL N/A 09/22/2016   Procedure: COLONOSCOPY WITH PROPOFOL;  Surgeon: Scot Jun, MD;  Location: [facility] ENDOSCOPY;  Service: Endoscopy;  Laterality: N/A;   COLONOSCOPY WITH PROPOFOL N/A 02/15/2022   Procedure: COLONOSCOPY WITH PROPOFOL;  Surgeon: Toney Reil, MD;  Location: [facility] ENDOSCOPY;  Service: Gastroenterology;  Laterality: N/A;   EP IMPLANTABLE DEVICE N/A 08/05/2015   Procedure: Loop Recorder Insertion;  Surgeon: Duke Salvia, MD;  Location: [facility] INVASIVE CV LAB;  Service: Cardiovascular;  Laterality: N/A;   ESOPHAGOGASTRODUODENOSCOPY N/A 02/15/2022   Procedure: ESOPHAGOGASTRODUODENOSCOPY (EGD);  Surgeon: Toney Reil, MD;  Location: [facility] ENDOSCOPY;  Service: Gastroenterology;  Laterality: N/A;   ESOPHAGOGASTRODUODENOSCOPY (EGD) WITH PROPOFOL N/A 09/22/2016   Procedure: ESOPHAGOGASTRODUODENOSCOPY (EGD) WITH PROPOFOL;  Surgeon: Scot Jun, MD;  Location: [facility] ENDOSCOPY;  Service: Endoscopy;  Laterality: N/A;   ESOPHAGOGASTRODUODENOSCOPY (EGD) WITH PROPOFOL N/A 12/08/2016   Procedure: ESOPHAGOGASTRODUODENOSCOPY (EGD) WITH PROPOFOL;  Surgeon: Scot Jun, MD;  Location: [facility] ENDOSCOPY;  Service: Endoscopy;  Laterality: N/A;   FRACTURE SURGERY     GIVENS CAPSULE STUDY N/A 04/19/2022   Procedure: GIVENS CAPSULE STUDY;  Surgeon: Toney Reil, MD;  Location: [facility] ENDOSCOPY;  Service: Gastroenterology;  Laterality: N/A;   INSERTION HYBRID ANTERIOVENOUS GORTEX GRAFT  06/18/2022   Procedure: INSERTION HYBRID ANTERIOVENOUS GORTEX GRAFT;  Surgeon: Renford Dills, MD;  Location: [facility] ORS;  Service: Vascular;;   KNEE ARTHROSCOPY Left 02/09/2003   LOWER EXTREMITY ANGIOGRAPHY Left 08/12/2021   Procedure: Lower Extremity Angiography;  Surgeon: Renford Dills, MD;  Location: [facility] INVASIVE CV LAB;  Service: Cardiovascular;  Laterality: Left;   TEE WITHOUT CARDIOVERSION N/A 01/31/2015   Procedure: TRANSESOPHAGEAL ECHOCARDIOGRAM (TEE);  Surgeon: Lewayne Bunting, MD;  Location: [facility] ENDOSCOPY;  Service: Cardiovascular;  Laterality: N/A;   TEMPORARY DIALYSIS CATHETER N/A 08/14/2021   Procedure: TEMPORARY DIALYSIS CATHETER;  Surgeon: Renford Dills, MD;  Location: [facility] INVASIVE CV LAB;  Service: Cardiovascular;   Laterality: N/A;   TUBAL LIGATION  02/09/1976   ULNAR NERVE TRANSPOSITION  02/08/2006    Current Medications: Current Meds  Medication Sig   albuterol (VENTOLIN HFA) 108 (90 Base) MCG/ACT inhaler TAKE 2 PUFFS BY MOUTH EVERY 6 HOURS AS NEEDED FOR WHEEZE OR SHORTNESS OF BREATH (Patient taking differently: Inhale 2 puffs into the lungs every 6 (six) hours as needed for wheezing or shortness of breath.)   apixaban (ELIQUIS) 5 MG TABS tablet Take 1 tablet (5 mg total) by mouth 2 (two) times daily.   atorvastatin (LIPITOR) 40 MG tablet Take 1 tablet (40 mg total) by mouth daily.   buPROPion (WELLBUTRIN SR) 150 MG 12 hr tablet Take 1 tablet (150 mg total) by mouth 2 (two) times daily.   calcitRIOL (ROCALTROL) 0.25 MCG capsule Take 0.25 mcg by mouth daily.   Dulaglutide 3 MG/0.5ML SOPN Inject into the skin once a week.   fluticasone (FLONASE) 50 MCG/ACT nasal spray Place 2 sprays into both nostrils daily. (Patient taking differently: Place 2 sprays into both nostrils as needed.)   gabapentin (NEURONTIN) 300 MG capsule Take 300 mg by mouth 2 (two) times daily.  glucose blood test strip OneTouch Verio strips   insulin aspart (NOVOLOG) 100 UNIT/ML injection Insulin pump   Insulin Disposable Pump (OMNIPOD DASH PODS, GEN 4,) MISC USE 1 POD EACH EVERY 72    HOURS   Insulin Human (INSULIN PUMP) SOLN Per endocrinoloy   loratadine (CLARITIN) 10 MG tablet Take 1 tablet by mouth daily.   meclizine (ANTIVERT) 12.5 MG tablet Take 1 tablet (12.5 mg total) by mouth 3 (three) times daily as needed for dizziness.   OXYGEN Place 2 L into the nose continuous.   PARoxetine (PAXIL) 40 MG tablet Take 1 tablet (40 mg total) by mouth daily.   Spacer/Aero-Holding [facility] DIAMOND) MISC Use with inhaler to ensure medication is delivered throughout lungs   traMADol (ULTRAM) 50 MG tablet Take 1 tablet (50 mg total) by mouth daily as needed.   Vitamin D, Ergocalciferol, (DRISDOL) 1.25 MG (50000 UNIT) CAPS capsule  Take 50,000 Units by mouth every 7 (seven) days.   [DISCONTINUED] carvedilol (COREG) 25 MG tablet Take 1 tablet (25 mg total) by mouth 2 (two) times daily with a meal.   [DISCONTINUED] clopidogrel (PLAVIX) 75 MG tablet Take 1 tablet (75 mg total) by mouth daily.   [DISCONTINUED] torsemide (DEMADEX) 20 MG tablet TAKE 3 TABLETS BY MOUTH EVERY DAY (Patient taking differently: Take 20 mg by mouth as directed. Mon, Fri)     Allergies:   Codeine and Ivp dye [iodinated contrast media]   Social History   Socioeconomic History   Marital status: Married    Spouse name: [name]   Number of children: 2   Years of education: Not on file   Highest education level: Bachelor's degree (e.g., BA, AB, BS)  Occupational History   Occupation: disabled    Comment: retired  Tobacco Use   Smoking status: Former    Packs/day: 0.50    Years: 47.00    Additional pack years: 0.00    Total pack years: 23.50    Types: Cigarettes    Start date: 02/08/1974    Quit date: 05/31/2022    Years since quitting: 0.1   Smokeless tobacco: Never  Vaping Use   Vaping Use: Never used  Substance and Sexual Activity   Alcohol use: No    Alcohol/week: 0.0 standard drinks of alcohol   Drug use: No   Sexual activity: Yes    Birth control/protection: None  Other Topics Concern   Not on file  Social History Narrative   Lives at home with stepson and husband, dogs and cats   Caffeine  Drinks sweet tea.   Right handed.    Social Determinants of Health   Financial Resource Strain: Low Risk  (12/11/2021)   Overall Financial Resource Strain (CARDIA)    Difficulty of Paying Living Expenses: Not very hard  Food Insecurity: No Food Insecurity (01/05/2022)   Hunger Vital Sign    Worried About Running Out of Food in the Last Year: Never true    Ran Out of Food in the Last Year: Never true  Transportation Needs: No Transportation Needs (01/05/2022)   PRAPARE - Administrator, Civil Service (Medical): No    Lack of  Transportation (Non-Medical): No  Physical Activity: Inactive (06/01/2022)   Exercise Vital Sign    Days of Exercise per Week: 0 days    Minutes of Exercise per Session: 0 min  Stress: No Stress Concern Present (06/01/2022)   [employer] of Occupational Health - Occupational Stress Questionnaire    Feeling of  Stress : Only a little  Social Connections: Moderately Isolated (12/11/2021)   Social Connection and Isolation Panel [NHANES]    Frequency of Communication with Friends and Family: More than three times a week    Frequency of Social Gatherings with Friends and Family: Once a week    Attends Religious Services: Never    Database administrator or Organizations: No    Attends Engineer, structural: Never    Marital Status: Married     Family History: The patient's family history includes Alcohol abuse in her paternal aunt; Aneurysm in her father; Asthma in her mother; COPD in her brother; Cancer in her maternal grandmother; Diabetes in her brother and mother; Heart disease in her father and mother. There is no history of Anemia, Arrhythmia, Clotting disorder, Fainting, Heart attack, Heart failure, Hyperlipidemia, or Hypertension.  ROS:   Please see the history of present illness.     All other systems reviewed and are negative.  EKGs/Labs/Other Studies Reviewed:    The following studies were reviewed today:   EKG:  EKG is  ordered today.  The ekg ordered today demonstrates sinus rhythm, right bundle branch block  Recent Labs: 10/06/2021: TSH 2.977 12/15/2021: ALT 10 01/04/2022: Magnesium 2.0 02/11/2022: B Natriuretic Peptide 735.3 06/07/2022: Platelets 541 06/18/2022: BUN 49; Creatinine, Ser 3.60; Hemoglobin 10.5; Potassium 4.2; Sodium 140  Recent Lipid Panel    Component Value Date/Time   CHOL 107 12/04/2021 1542   TRIG 112 12/04/2021 1542   HDL 36 (L) 12/04/2021 1542   CHOLHDL 3.0 12/04/2021 1542   CHOLHDL 5.0 10/06/2017 0330   VLDL 64 (H) 10/06/2017 0330    LDLCALC 50 12/04/2021 1542     Risk Assessment/Calculations:         Physical Exam:    VS:  BP (!) 98/56 (BP Location: Right Arm, Patient Position: Sitting, Cuff Size: Large)   Pulse 75   Ht 5\' 6"  (1.676 m)   Wt 230 lb (104.3 kg)   SpO2 92%   BMI 37.12 kg/m     Wt Readings from Last 3 Encounters:  07/09/22 230 lb (104.3 kg)  06/18/22 197 lb 15.6 oz (89.8 kg)  06/14/22 198 lb (89.8 kg)     GEN:  Well nourished, well developed in no acute distress HEENT: Normal NECK: No JVD; No carotid bruits CARDIAC: RRR, no murmurs, rubs, gallops RESPIRATORY: Diminished breath sounds, no wheezing ABDOMEN: Soft, non-tender, non-distended MUSCULOSKELETAL:  2+ edema; lower extremity wrapped in dressing SKIN: Warm and dry NEUROLOGIC:  Alert and oriented x 3 PSYCHIATRIC:  Normal affect   ASSESSMENT:    1. Chronic diastolic heart failure (HCC)   2. NICM (nonischemic cardiomyopathy) (HCC)   3. Hypotension, unspecified hypotension type   4. Medication management     PLAN:    In order of problems listed above:  HFpEF, leg edema, increase torsemide to 20 mg daily, check BMP in 10 days. Nonischemic cardiomyopathy, last EF 55 to 60%.  Repeat echo.  Stop Coreg due to low blood pressures.  Consider adding Toprol-XL if BP permits at follow-up visit.  Entresto previously stopped due to low blood pressures, worsening creatinine. BP running low, stop Coreg.  Entresto previously stopped.  Follow-up in 6 weeks.      Medication Adjustments/Labs and Tests Ordered: Current medicines are reviewed at length with the patient today.  Concerns regarding medicines are outlined above.  Orders Placed This Encounter  Procedures   Basic metabolic panel   ECHOCARDIOGRAM COMPLETE   Meds  ordered this encounter  Medications   torsemide (DEMADEX) 20 MG tablet    Sig: Take 1 tablet (20 mg) by mouth once daily    Dispense:  270 tablet    Refill:  1    Patient Instructions  Medication Instructions:  -  Your physician has recommended you make the following change in your medication:   1) STOP Coreg (Carvedilol)  2) START Plavix (Clopidogrel)  3) INCREASE Torsemide 20 mg: - take 1 tablet by mouth once daily  *If you need a refill on your cardiac medications before your next appointment, please call your pharmacy*   Lab Work: - Your physician recommends that you return for lab work in: 10 days (around 07/19/22)  BMP  Medical Mall Entrance at [facility] 1st desk on the right to check in (REGISTRATION)  Lab hours: Monday- Friday (7:30 am- 5:30 pm)   If you have labs (blood work) drawn today and your tests are completely normal, you will receive your results only by: MyChart Message (if you have MyChart) OR A paper copy in the mail If you have any lab test that is abnormal or we need to change your treatment, we will call you to review the results.   Testing/Procedures:  1) Echocardiogram: (ok to schedule after 6 week follow up if no availability) - Your physician has requested that you have an echocardiogram. Echocardiography is a painless test that uses sound waves to create images of your heart. It provides your doctor with information about the size and shape of your heart and how well your heart's chambers and valves are working. This procedure takes approximately one hour. There are no restrictions for this procedure. There is a possibility that an IV may need to be started during your test to inject an image enhancing agent. This is done to obtain more optimal pictures of your heart. Therefore we ask that you do at least drink some water prior to coming in to hydrate your veins.   Please do NOT wear cologne, perfume, aftershave, or lotions (deodorant is allowed). Please arrive 15 minutes prior to your appointment time.    Follow-Up: At [facility], you and your health needs are our priority.  As part of our continuing mission to provide you with exceptional heart care, we  have created designated Provider Care Teams.  These Care Teams include your primary Cardiologist (physician) and Advanced Practice Providers (APPs -  Physician Assistants and Nurse Practitioners) who all work together to provide you with the care you need, when you need it.  We recommend signing up for the patient portal called "MyChart".  Sign up information is provided on this After Visit Summary.  MyChart is used to connect with patients for Virtual Visits (Telemedicine).  Patients are able to view lab/test results, encounter notes, upcoming appointments, etc.  Non-urgent messages can be sent to your provider as well.   To learn more about what you can do with MyChart, go to [email or web address].    Your next appointment:   6 week(s)  Provider:   Debbe Odea, MD (ONLY)   Other Instructions  Echocardiogram An echocardiogram is a test that uses sound waves (ultrasound) to produce images of the heart. Images from an echocardiogram can provide important information about: Heart size and shape. The size and thickness and movement of your heart's walls. Heart muscle function and strength. Heart valve function or if you have stenosis. Stenosis is when the heart valves are too narrow. If blood is flowing backward  through the heart valves (regurgitation). A tumor or infectious growth around the heart valves. Areas of heart muscle that are not working well because of poor blood flow or injury from a heart attack. Aneurysm detection. An aneurysm is a weak or damaged part of an artery wall. The wall bulges out from the normal force of blood pumping through the body. Tell a health care provider about: Any allergies you have. All medicines you are taking, including vitamins, herbs, eye drops, creams, and over-the-counter medicines. Any blood disorders you have. Any surgeries you have had. Any medical conditions you have. Whether you are pregnant or may be pregnant. What are the  risks? Generally, this is a safe test. However, problems may occur, including an allergic reaction to dye (contrast) that may be used during the test. What happens before the test? No specific preparation is needed. You may eat and drink normally. What happens during the test?  You will take off your clothes from the waist up and put on a hospital gown. Electrodes or electrocardiogram (ECG)patches may be placed on your chest. The electrodes or patches are then connected to a device that monitors your heart rate and rhythm. You will lie down on a table for an ultrasound exam. A gel will be applied to your chest to help sound waves pass through your skin. A handheld device, called a transducer, will be pressed against your chest and moved over your heart. The transducer produces sound waves that travel to your heart and bounce back (or "echo" back) to the transducer. These sound waves will be captured in real-time and changed into images of your heart that can be viewed on a video monitor. The images will be recorded on a computer and reviewed by your health care provider. You may be asked to change positions or hold your breath for a short time. This makes it easier to get different views or better views of your heart. In some cases, you may receive contrast through an IV in one of your veins. This can improve the quality of the pictures from your heart. The procedure may vary among health care providers and hospitals. What can I expect after the test? You may return to your normal, everyday life, including diet, activities, and medicines, unless your health care provider tells you not to do that. Follow these instructions at home: It is up to you to get the results of your test. Ask your health care provider, or the department that is doing the test, when your results will be ready. Keep all follow-up visits. This is important. Summary An echocardiogram is a test that uses sound waves (ultrasound)  to produce images of the heart. Images from an echocardiogram can provide important information about the size and shape of your heart, heart muscle function, heart valve function, and other possible heart problems. You do not need to do anything to prepare before this test. You may eat and drink normally. After the echocardiogram is completed, you may return to your normal, everyday life, unless your health care provider tells you not to do that. This information is not intended to replace advice given to you by your health care provider. Make sure you discuss any questions you have with your health care provider. Document Revised: 10/08/2020 Document Reviewed: 09/18/2019 Elsevier Patient Education  2024 Elsevier Inc.     Signed, Debbe Odea, MD  07/09/2022 11:41 AM    [facility]

## 2022-07-09 NOTE — Patient Instructions (Signed)
Medication Instructions:  - Your physician has recommended you make the following change in your medication:   1) STOP Coreg (Carvedilol)  2) START Plavix (Clopidogrel)  3) INCREASE Torsemide 20 mg: - take 1 tablet by mouth once daily  *If you need a refill on your cardiac medications before your next appointment, please call your pharmacy*   Lab Work: - Your physician recommends that you return for lab work in: 10 days (around 07/19/22)  BMP  Medical Mall Entrance at [facility] 1st desk on the right to check in (REGISTRATION)  Lab hours: Monday- Friday (7:30 am- 5:30 pm)   If you have labs (blood work) drawn today and your tests are completely normal, you will receive your results only by: MyChart Message (if you have MyChart) OR A paper copy in the mail If you have any lab test that is abnormal or we need to change your treatment, we will call you to review the results.   Testing/Procedures:  1) Echocardiogram: (ok to schedule after 6 week follow up if no availability) - Your physician has requested that you have an echocardiogram. Echocardiography is a painless test that uses sound waves to create images of your heart. It provides your doctor with information about the size and shape of your heart and how well your heart's chambers and valves are working. This procedure takes approximately one hour. There are no restrictions for this procedure. There is a possibility that an IV may need to be started during your test to inject an image enhancing agent. This is done to obtain more optimal pictures of your heart. Therefore we ask that you do at least drink some water prior to coming in to hydrate your veins.   Please do NOT wear cologne, perfume, aftershave, or lotions (deodorant is allowed). Please arrive 15 minutes prior to your appointment time.    Follow-Up: At [facility], you and your health needs are our priority.  As part of our continuing mission to provide you  with exceptional heart care, we have created designated Provider Care Teams.  These Care Teams include your primary Cardiologist (physician) and Advanced Practice Providers (APPs -  Physician Assistants and Nurse Practitioners) who all work together to provide you with the care you need, when you need it.  We recommend signing up for the patient portal called "MyChart".  Sign up information is provided on this After Visit Summary.  MyChart is used to connect with patients for Virtual Visits (Telemedicine).  Patients are able to view lab/test results, encounter notes, upcoming appointments, etc.  Non-urgent messages can be sent to your provider as well.   To learn more about what you can do with MyChart, go to [email or web address].    Your next appointment:   6 week(s)  Provider:   Debbe Odea, MD (ONLY)   Other Instructions  Echocardiogram An echocardiogram is a test that uses sound waves (ultrasound) to produce images of the heart. Images from an echocardiogram can provide important information about: Heart size and shape. The size and thickness and movement of your heart's walls. Heart muscle function and strength. Heart valve function or if you have stenosis. Stenosis is when the heart valves are too narrow. If blood is flowing backward through the heart valves (regurgitation). A tumor or infectious growth around the heart valves. Areas of heart muscle that are not working well because of poor blood flow or injury from a heart attack. Aneurysm detection. An aneurysm is a weak or damaged part of  an artery wall. The wall bulges out from the normal force of blood pumping through the body. Tell a health care provider about: Any allergies you have. All medicines you are taking, including vitamins, herbs, eye drops, creams, and over-the-counter medicines. Any blood disorders you have. Any surgeries you have had. Any medical conditions you have. Whether you are pregnant or may be  pregnant. What are the risks? Generally, this is a safe test. However, problems may occur, including an allergic reaction to dye (contrast) that may be used during the test. What happens before the test? No specific preparation is needed. You may eat and drink normally. What happens during the test?  You will take off your clothes from the waist up and put on a hospital gown. Electrodes or electrocardiogram (ECG)patches may be placed on your chest. The electrodes or patches are then connected to a device that monitors your heart rate and rhythm. You will lie down on a table for an ultrasound exam. A gel will be applied to your chest to help sound waves pass through your skin. A handheld device, called a transducer, will be pressed against your chest and moved over your heart. The transducer produces sound waves that travel to your heart and bounce back (or "echo" back) to the transducer. These sound waves will be captured in real-time and changed into images of your heart that can be viewed on a video monitor. The images will be recorded on a computer and reviewed by your health care provider. You may be asked to change positions or hold your breath for a short time. This makes it easier to get different views or better views of your heart. In some cases, you may receive contrast through an IV in one of your veins. This can improve the quality of the pictures from your heart. The procedure may vary among health care providers and hospitals. What can I expect after the test? You may return to your normal, everyday life, including diet, activities, and medicines, unless your health care provider tells you not to do that. Follow these instructions at home: It is up to you to get the results of your test. Ask your health care provider, or the department that is doing the test, when your results will be ready. Keep all follow-up visits. This is important. Summary An echocardiogram is a test that uses  sound waves (ultrasound) to produce images of the heart. Images from an echocardiogram can provide important information about the size and shape of your heart, heart muscle function, heart valve function, and other possible heart problems. You do not need to do anything to prepare before this test. You may eat and drink normally. After the echocardiogram is completed, you may return to your normal, everyday life, unless your health care provider tells you not to do that. This information is not intended to replace advice given to you by your health care provider. Make sure you discuss any questions you have with your health care provider. Document Revised: 10/08/2020 Document Reviewed: 09/18/2019 Elsevier Patient Education  2024 [employer].

## 2022-07-12 ENCOUNTER — Inpatient Hospital Stay: Payer: Medicare Other | Attending: Oncology

## 2022-07-12 ENCOUNTER — Other Ambulatory Visit: Payer: Self-pay

## 2022-07-12 DIAGNOSIS — E1142 Type 2 diabetes mellitus with diabetic polyneuropathy: Secondary | ICD-10-CM | POA: Diagnosis not present

## 2022-07-12 DIAGNOSIS — I10 Essential (primary) hypertension: Secondary | ICD-10-CM | POA: Diagnosis not present

## 2022-07-12 DIAGNOSIS — Z91148 Patient's other noncompliance with medication regimen for other reason: Secondary | ICD-10-CM | POA: Diagnosis not present

## 2022-07-12 DIAGNOSIS — E1129 Type 2 diabetes mellitus with other diabetic kidney complication: Secondary | ICD-10-CM | POA: Diagnosis not present

## 2022-07-12 DIAGNOSIS — E1122 Type 2 diabetes mellitus with diabetic chronic kidney disease: Secondary | ICD-10-CM | POA: Diagnosis not present

## 2022-07-12 DIAGNOSIS — E1121 Type 2 diabetes mellitus with diabetic nephropathy: Secondary | ICD-10-CM | POA: Diagnosis not present

## 2022-07-12 DIAGNOSIS — E113393 Type 2 diabetes mellitus with moderate nonproliferative diabetic retinopathy without macular edema, bilateral: Secondary | ICD-10-CM | POA: Diagnosis not present

## 2022-07-12 DIAGNOSIS — E559 Vitamin D deficiency, unspecified: Secondary | ICD-10-CM | POA: Diagnosis not present

## 2022-07-12 DIAGNOSIS — E1165 Type 2 diabetes mellitus with hyperglycemia: Secondary | ICD-10-CM | POA: Diagnosis not present

## 2022-07-12 DIAGNOSIS — Z9641 Presence of insulin pump (external) (internal): Secondary | ICD-10-CM | POA: Diagnosis not present

## 2022-07-12 DIAGNOSIS — E1159 Type 2 diabetes mellitus with other circulatory complications: Secondary | ICD-10-CM | POA: Diagnosis not present

## 2022-07-12 MED ORDER — TRULICITY 4.5 MG/0.5ML ~~LOC~~ SOAJ
4.5000 mg | SUBCUTANEOUS | 3 refills | Status: DC
  Filled 2022-07-12: qty 2, 28d supply, fill #0

## 2022-07-13 ENCOUNTER — Other Ambulatory Visit (INDEPENDENT_AMBULATORY_CARE_PROVIDER_SITE_OTHER): Payer: Self-pay | Admitting: Vascular Surgery

## 2022-07-13 DIAGNOSIS — N186 End stage renal disease: Secondary | ICD-10-CM

## 2022-07-13 DIAGNOSIS — Z95828 Presence of other vascular implants and grafts: Secondary | ICD-10-CM

## 2022-07-14 ENCOUNTER — Ambulatory Visit (INDEPENDENT_AMBULATORY_CARE_PROVIDER_SITE_OTHER): Payer: Medicare Other | Admitting: Licensed Clinical Social Worker

## 2022-07-14 DIAGNOSIS — Z91199 Patient's noncompliance with other medical treatment and regimen due to unspecified reason: Secondary | ICD-10-CM

## 2022-07-15 ENCOUNTER — Ambulatory Visit (INDEPENDENT_AMBULATORY_CARE_PROVIDER_SITE_OTHER): Payer: Medicare Other | Admitting: Nurse Practitioner

## 2022-07-15 ENCOUNTER — Ambulatory Visit (INDEPENDENT_AMBULATORY_CARE_PROVIDER_SITE_OTHER): Payer: Medicare Other

## 2022-07-15 ENCOUNTER — Encounter (INDEPENDENT_AMBULATORY_CARE_PROVIDER_SITE_OTHER): Payer: Self-pay | Admitting: Nurse Practitioner

## 2022-07-15 VITALS — BP 112/61 | HR 82 | Resp 18

## 2022-07-15 DIAGNOSIS — Z95828 Presence of other vascular implants and grafts: Secondary | ICD-10-CM | POA: Diagnosis not present

## 2022-07-15 DIAGNOSIS — N186 End stage renal disease: Secondary | ICD-10-CM | POA: Diagnosis not present

## 2022-07-15 DIAGNOSIS — M7989 Other specified soft tissue disorders: Secondary | ICD-10-CM

## 2022-07-15 DIAGNOSIS — I10 Essential (primary) hypertension: Secondary | ICD-10-CM

## 2022-07-15 DIAGNOSIS — N184 Chronic kidney disease, stage 4 (severe): Secondary | ICD-10-CM

## 2022-07-15 MED ORDER — DOXYCYCLINE HYCLATE 100 MG PO CAPS: 100.0000 mg | ORAL_CAPSULE | Freq: Two times a day (BID) | ORAL | 0 refills | Status: DC

## 2022-07-15 NOTE — Progress Notes (Signed)
Subjective:    Patient ID: Jocelyn Wells, female    DOB: [birth year], 67 y.o.   MRN: [id number] Chief Complaint  Patient presents with   Follow-up    The patient returns today for follow-up evaluation of her left upper extremity brachial axillary AV graft.  Initially it was planned to be a brachiocephalic AV fistula but it was found that the cephalic was occluded during surgery.  She tolerated the surgery well however she notes that her legs have been swelling in the right is more notably swollen today.  However she was off of diuretics for short time and has recently restarted them.  There is a small fluid blister present on the right lower extremity.  Additionally, the patient has dehiscence of the wound in her antecubital fossa.  The wound her axillary area remains intact.  She denies any pain in her lower extremities.  She notes that she has been having swelling of both lower extremities.  Today she has a flow volume of 1508.  The graft appears patent however there is some elevated velocities at the arterial anastomosis.    Review of Systems  Cardiovascular:  Positive for leg swelling.  Skin:  Positive for wound.  All other systems reviewed and are negative.      Objective:   Physical Exam Vitals reviewed.  HENT:     Head: Normocephalic.  Cardiovascular:     Rate and Rhythm: Normal rate.     Pulses:          Radial pulses are 2+ on the left side.  Pulmonary:     Effort: Pulmonary effort is normal.  Musculoskeletal:     Right lower leg: 2+ Pitting Edema present.  Skin:    General: Skin is warm and dry.  Neurological:     Mental Status: She is alert and oriented to person, place, and time.  Psychiatric:        Mood and Affect: Mood normal.        Behavior: Behavior normal.        Thought Content: Thought content normal.        Judgment: Judgment normal.     BP 112/61 (BP Location: Right Arm)   Pulse 82   Resp 18   Past Medical History:  Diagnosis Date   Acute  ischemic left MCA stroke (HCC) 01/29/2015   a.) MRI brain 01/29/2015: acute left MCA territory infarcts involving the posterior left frontal lobe and left insular cortex   Anemia of chronic renal failure    Anxiety    Arthritis    Complication of anesthesia    a.) PONV; b.) awareness under anesthesia   COPD (chronic obstructive pulmonary disease) (HCC)    Depression    Dyspnea    ESRD (end stage renal disease) (HCC)    Headache    HFrEF (heart failure with reduced ejection fraction) (HCC)    a.) TTE 01/30/2015: EF 30-35%, diff HK, LAE, G2DD; b.) TTE 06/13/2015: EF 40-45%, diff HK, G1DD; c.) TTE 10/06/2017: EF 60-65% MAC, no IAS; d.) TTE 09/29/2021: EF 45-50%;, LVH, LAE, AoV sclerosis, G1DD; e.) TTE 02/24/2022: EF 60-65%, no IAS   HTN (hypertension)    Leukocytosis    Long term current use of anticoagulant    a.) apixaban   Long term current use of antithrombotics/antiplatelets    a.) clopidogrel   NICM (nonischemic cardiomyopathy) (HCC)    a.) TTE 01/30/2015: EF 30-35%; b.) R/LHC 03/25/2015: EF 25-30%, mRA 13, mPA 30, mPCWP  26, LVEDP 17, CO 3.66, CI 1.8; c.) TTE 06/13/2015: EF 40-45%; d.) TTE 10/06/2017: EF 60-65%; e.) TTE 09/29/2021: EF 45-50%; f.) TTE 02/24/2022: EF 60-65%   Obesity    On supplemental oxygen by nasal cannula    a.) 2L/[place] at night   Osteoporosis    Peripheral vascular disease (HCC)    Type 2 diabetes mellitus treated with insulin (HCC)     Social History   Socioeconomic History   Marital status: Married    Spouse name: [name]   Number of children: 2   Years of education: Not on file   Highest education level: Bachelor's degree (e.g., BA, AB, BS)  Occupational History   Occupation: disabled    Comment: retired  Tobacco Use   Smoking status: Former    Packs/day: 0.50    Years: 47.00    Additional pack years: 0.00    Total pack years: 23.50    Types: Cigarettes    Start date: 02/08/1974    Quit date: 05/31/2022    Years since quitting: 0.1   Smokeless  tobacco: Never  Vaping Use   Vaping Use: Never used  Substance and Sexual Activity   Alcohol use: No    Alcohol/week: 0.0 standard drinks of alcohol   Drug use: No   Sexual activity: Yes    Birth control/protection: None  Other Topics Concern   Not on file  Social History Narrative   Lives at home with stepson and husband, dogs and cats   Caffeine  Drinks sweet tea.   Right handed.    Social Determinants of Health   Financial Resource Strain: Low Risk  (12/11/2021)   Overall Financial Resource Strain (CARDIA)    Difficulty of Paying Living Expenses: Not very hard  Food Insecurity: No Food Insecurity (01/05/2022)   Hunger Vital Sign    Worried About Running Out of Food in the Last Year: Never true    Ran Out of Food in the Last Year: Never true  Transportation Needs: No Transportation Needs (01/05/2022)   PRAPARE - Administrator, Civil Service (Medical): No    Lack of Transportation (Non-Medical): No  Physical Activity: Inactive (06/01/2022)   Exercise Vital Sign    Days of Exercise per Week: 0 days    Minutes of Exercise per Session: 0 min  Stress: No Stress Concern Present (06/01/2022)   [employer] of Occupational Health - Occupational Stress Questionnaire    Feeling of Stress : Only a little  Social Connections: Moderately Isolated (12/11/2021)   Social Connection and Isolation Panel [NHANES]    Frequency of Communication with Friends and Family: More than three times a week    Frequency of Social Gatherings with Friends and Family: Once a week    Attends Religious Services: Never    Database administrator or Organizations: No    Attends Banker Meetings: Never    Marital Status: Married  Catering manager Violence: Not At Risk (01/02/2022)   Humiliation, Afraid, Rape, and Kick questionnaire    Fear of Current or Ex-Partner: No    Emotionally Abused: No    Physically Abused: No    Sexually Abused: No    Past Surgical History:   Procedure Laterality Date   ABDOMINAL HYSTERECTOMY     ANKLE FRACTURE SURGERY Left 02/09/2000   AV FISTULA PLACEMENT Left 06/18/2022   Procedure: ARTERIOVENOUS (AV) FISTULA CREATION (BRACHIAL AXILLA);  Surgeon: Renford Dills, MD;  Location: [facility] ORS;  Service: Vascular;  Laterality: Left;   BILATERAL SALPINGOOPHORECTOMY  02/08/1998   CARDIAC CATHETERIZATION N/A 03/25/2015   Procedure: Right/Left Heart Cath and Coronary Angiography;  Surgeon: Lyn Records, MD;  Location: [facility] INVASIVE CV LAB;  Service: Cardiovascular;  Laterality: N/A;   CESAREAN SECTION  02/09/1976   COLONOSCOPY WITH PROPOFOL N/A 09/22/2016   Procedure: COLONOSCOPY WITH PROPOFOL;  Surgeon: Scot Jun, MD;  Location: [facility] ENDOSCOPY;  Service: Endoscopy;  Laterality: N/A;   COLONOSCOPY WITH PROPOFOL N/A 02/15/2022   Procedure: COLONOSCOPY WITH PROPOFOL;  Surgeon: Toney Reil, MD;  Location: [facility] ENDOSCOPY;  Service: Gastroenterology;  Laterality: N/A;   EP IMPLANTABLE DEVICE N/A 08/05/2015   Procedure: Loop Recorder Insertion;  Surgeon: Duke Salvia, MD;  Location: [facility] INVASIVE CV LAB;  Service: Cardiovascular;  Laterality: N/A;   ESOPHAGOGASTRODUODENOSCOPY N/A 02/15/2022   Procedure: ESOPHAGOGASTRODUODENOSCOPY (EGD);  Surgeon: Toney Reil, MD;  Location: [facility] ENDOSCOPY;  Service: Gastroenterology;  Laterality: N/A;   ESOPHAGOGASTRODUODENOSCOPY (EGD) WITH PROPOFOL N/A 09/22/2016   Procedure: ESOPHAGOGASTRODUODENOSCOPY (EGD) WITH PROPOFOL;  Surgeon: Scot Jun, MD;  Location: [facility] ENDOSCOPY;  Service: Endoscopy;  Laterality: N/A;   ESOPHAGOGASTRODUODENOSCOPY (EGD) WITH PROPOFOL N/A 12/08/2016   Procedure: ESOPHAGOGASTRODUODENOSCOPY (EGD) WITH PROPOFOL;  Surgeon: Scot Jun, MD;  Location: [facility] ENDOSCOPY;  Service: Endoscopy;  Laterality: N/A;   FRACTURE SURGERY     GIVENS CAPSULE STUDY N/A 04/19/2022   Procedure: GIVENS CAPSULE STUDY;  Surgeon: Toney Reil, MD;  Location: [facility]  ENDOSCOPY;  Service: Gastroenterology;  Laterality: N/A;   INSERTION HYBRID ANTERIOVENOUS GORTEX GRAFT  06/18/2022   Procedure: INSERTION HYBRID ANTERIOVENOUS GORTEX GRAFT;  Surgeon: Renford Dills, MD;  Location: [facility] ORS;  Service: Vascular;;   KNEE ARTHROSCOPY Left 02/09/2003   LOWER EXTREMITY ANGIOGRAPHY Left 08/12/2021   Procedure: Lower Extremity Angiography;  Surgeon: Renford Dills, MD;  Location: [facility] INVASIVE CV LAB;  Service: Cardiovascular;  Laterality: Left;   TEE WITHOUT CARDIOVERSION N/A 01/31/2015   Procedure: TRANSESOPHAGEAL ECHOCARDIOGRAM (TEE);  Surgeon: Lewayne Bunting, MD;  Location: [facility] ENDOSCOPY;  Service: Cardiovascular;  Laterality: N/A;   TEMPORARY DIALYSIS CATHETER N/A 08/14/2021   Procedure: TEMPORARY DIALYSIS CATHETER;  Surgeon: Renford Dills, MD;  Location: [facility] INVASIVE CV LAB;  Service: Cardiovascular;  Laterality: N/A;   TUBAL LIGATION  02/09/1976   ULNAR NERVE TRANSPOSITION  02/08/2006    Family History  Problem Relation Age of Onset   Heart disease Mother        died from CHF   Asthma Mother    Diabetes Mother    Heart disease Father    Aneurysm Father    COPD Brother    Diabetes Brother    Alcohol abuse Paternal Aunt    Cancer Maternal Grandmother        unknownorigin   Anemia Neg Hx    Arrhythmia Neg Hx    Clotting disorder Neg Hx    Fainting Neg Hx    Heart attack Neg Hx    Heart failure Neg Hx    Hyperlipidemia Neg Hx    Hypertension Neg Hx     Allergies  Allergen Reactions   Codeine Nausea And Vomiting   Ivp Dye [Iodinated Contrast Media]     Patients states the IV Dye shuts her kidneys down       Latest Ref Rng & Units 06/18/2022    7:14 AM 06/07/2022    9:49 AM 05/10/2022    9:23 AM  CBC  WBC 4.0 - 10.5 K/uL  12.2  11.8   Hemoglobin 12.0 - 15.0 g/dL 16.1  09.6  04.5   Hematocrit 36.0 - 46.0 % 31.0  33.6  36.1   Platelets 150 - 400 K/uL  541  625       CMP     Component Value Date/Time   NA 140 06/18/2022  0714   NA 140 12/04/2021 1542   K 4.2 06/18/2022 0714   CL 106 06/18/2022 0714   CO2 26 02/26/2022 1625   GLUCOSE 209 (H) 06/18/2022 0714   BUN 49 (H) 06/18/2022 0714   BUN 38 (H) 12/04/2021 1542   CREATININE 3.60 (H) 06/18/2022 0714   CREATININE 1.37 (H) 03/21/2015 1451   CALCIUM 9.0 02/26/2022 1625   PROT 6.4 (L) 12/15/2021 1528   PROT 6.0 12/04/2021 1542   ALBUMIN 3.1 (L) 12/15/2021 1528   ALBUMIN 3.6 (L) 12/04/2021 1542   AST 13 (L) 12/15/2021 1528   ALT 10 12/15/2021 1528   ALKPHOS 81 12/15/2021 1528   BILITOT 0.4 12/15/2021 1528   BILITOT <0.2 12/04/2021 1542   EGFR 20 (L) 12/04/2021 1542   GFRNONAA 13 04/22/2022 1233     No results found.     Assessment & Plan:   1. CKD (chronic kidney disease), stage IV (HCC) The patient's recent graft placement has dehiscence at the antecubital fossa area.  It is also reddened concerning for some cellulitis of the area.  Will send in antibiotics for this.  Will also place Aquacel over the wound and have this changed and monitored by home health.  Will plan to have her back in about 2 to 3 weeks to reevaluate.   - Ambulatory referral to Home Health  2. Essential hypertension Continue antihypertensive medications as already ordered, these medications have been reviewed and there are no changes at this time.  3. Leg swelling The patient has some notable swelling of her right lower extremity.  It is noted that she has recently started on her diuretics and so this may be helpful but may be result as to why it still swollen.  Will place the patient in [place]na boots.  She is also having some swelling in her left as well as we will do both legs.  Will also get home health back out to try to assist with care of this as well.   Current Outpatient Medications on File Prior to Visit  Medication Sig Dispense Refill   albuterol (VENTOLIN HFA) 108 (90 Base) MCG/ACT inhaler TAKE 2 PUFFS BY MOUTH EVERY 6 HOURS AS NEEDED FOR WHEEZE OR SHORTNESS OF  BREATH (Patient taking differently: Inhale 2 puffs into the lungs every 6 (six) hours as needed for wheezing or shortness of breath.) 8.5 each 2   apixaban (ELIQUIS) 5 MG TABS tablet Take 1 tablet (5 mg total) by mouth 2 (two) times daily. 180 tablet 2   atorvastatin (LIPITOR) 40 MG tablet Take 1 tablet (40 mg total) by mouth daily. 90 tablet 3   buPROPion (WELLBUTRIN SR) 150 MG 12 hr tablet Take 1 tablet (150 mg total) by mouth 2 (two) times daily. 180 tablet 1   calcitRIOL (ROCALTROL) 0.25 MCG capsule Take 0.25 mcg by mouth daily.     Dulaglutide (TRULICITY) 4.5 MG/0.5ML SOPN Inject 4.5 mg into the skin every 7 (seven) days. 6 mL 3   fluticasone (FLONASE) 50 MCG/ACT nasal spray Place 2 sprays into both nostrils daily. (Patient taking differently: Place 2 sprays into both nostrils as needed.) 16 g 6   gabapentin (NEURONTIN) 300  MG capsule Take 300 mg by mouth 2 (two) times daily.     glucose blood test strip OneTouch Verio strips     insulin aspart (NOVOLOG) 100 UNIT/ML injection Insulin pump     Insulin Disposable Pump (OMNIPOD DASH PODS, GEN 4,) MISC USE 1 POD EACH EVERY 72    HOURS     Insulin Human (INSULIN PUMP) SOLN Per endocrinoloy     loratadine (CLARITIN) 10 MG tablet Take 1 tablet by mouth daily.     meclizine (ANTIVERT) 12.5 MG tablet Take 1 tablet (12.5 mg total) by mouth 3 (three) times daily as needed for dizziness. 30 tablet 0   OXYGEN Place 2 L into the nose continuous.     PARoxetine (PAXIL) 40 MG tablet Take 1 tablet (40 mg total) by mouth daily. 90 tablet 1   Spacer/Aero-Holding Chambers (OPTICHAMBER DIAMOND) MISC Use with inhaler to ensure medication is delivered throughout lungs 1 each 0   torsemide (DEMADEX) 20 MG tablet Take 1 tablet (20 mg) by mouth once daily 270 tablet 1   traMADol (ULTRAM) 50 MG tablet Take 1 tablet (50 mg total) by mouth daily as needed. 30 tablet 2   Vitamin D, Ergocalciferol, (DRISDOL) 1.25 MG (50000 UNIT) CAPS capsule Take 50,000 Units by mouth  every 7 (seven) days.     No current facility-administered medications on file prior to visit.    There are no Patient Instructions on file for this visit. No follow-ups on file.   Georgiana Spinner, NP

## 2022-07-20 ENCOUNTER — Telehealth: Payer: Self-pay | Admitting: Student in an Organized Health Care Education/Training Program

## 2022-07-20 NOTE — Progress Notes (Signed)
LCSW called pt and left vm at 1:10PM inquiring about pt attendance to appt. LCSW resent appt link via email and text to pt. Pt did not respond or attend appt.

## 2022-07-20 NOTE — Telephone Encounter (Signed)
Patient states she is having to wear Jocelyn Wells for severe swelling. Can she still have Qutenza done tomorrow ? Please advise patient

## 2022-07-21 ENCOUNTER — Ambulatory Visit: Payer: Medicare Other | Admitting: Student in an Organized Health Care Education/Training Program

## 2022-07-21 ENCOUNTER — Other Ambulatory Visit: Payer: Self-pay

## 2022-07-22 DIAGNOSIS — Z992 Dependence on renal dialysis: Secondary | ICD-10-CM | POA: Diagnosis not present

## 2022-07-22 DIAGNOSIS — Z8673 Personal history of transient ischemic attack (TIA), and cerebral infarction without residual deficits: Secondary | ICD-10-CM | POA: Diagnosis not present

## 2022-07-22 DIAGNOSIS — G43909 Migraine, unspecified, not intractable, without status migrainosus: Secondary | ICD-10-CM | POA: Diagnosis not present

## 2022-07-22 DIAGNOSIS — I428 Other cardiomyopathies: Secondary | ICD-10-CM | POA: Diagnosis not present

## 2022-07-22 DIAGNOSIS — M109 Gout, unspecified: Secondary | ICD-10-CM | POA: Diagnosis not present

## 2022-07-22 DIAGNOSIS — E1122 Type 2 diabetes mellitus with diabetic chronic kidney disease: Secondary | ICD-10-CM | POA: Diagnosis not present

## 2022-07-22 DIAGNOSIS — M1990 Unspecified osteoarthritis, unspecified site: Secondary | ICD-10-CM | POA: Diagnosis not present

## 2022-07-22 DIAGNOSIS — Z7901 Long term (current) use of anticoagulants: Secondary | ICD-10-CM | POA: Diagnosis not present

## 2022-07-22 DIAGNOSIS — N186 End stage renal disease: Secondary | ICD-10-CM | POA: Diagnosis not present

## 2022-07-22 DIAGNOSIS — E1151 Type 2 diabetes mellitus with diabetic peripheral angiopathy without gangrene: Secondary | ICD-10-CM | POA: Diagnosis not present

## 2022-07-22 DIAGNOSIS — Z6836 Body mass index (BMI) 36.0-36.9, adult: Secondary | ICD-10-CM | POA: Diagnosis not present

## 2022-07-22 DIAGNOSIS — Z556 Problems related to health literacy: Secondary | ICD-10-CM | POA: Diagnosis not present

## 2022-07-22 DIAGNOSIS — J449 Chronic obstructive pulmonary disease, unspecified: Secondary | ICD-10-CM | POA: Diagnosis not present

## 2022-07-22 DIAGNOSIS — F32A Depression, unspecified: Secondary | ICD-10-CM | POA: Diagnosis not present

## 2022-07-22 DIAGNOSIS — I5022 Chronic systolic (congestive) heart failure: Secondary | ICD-10-CM | POA: Diagnosis not present

## 2022-07-22 DIAGNOSIS — I132 Hypertensive heart and chronic kidney disease with heart failure and with stage 5 chronic kidney disease, or end stage renal disease: Secondary | ICD-10-CM | POA: Diagnosis not present

## 2022-07-22 DIAGNOSIS — D631 Anemia in chronic kidney disease: Secondary | ICD-10-CM | POA: Diagnosis not present

## 2022-07-22 DIAGNOSIS — Z86718 Personal history of other venous thrombosis and embolism: Secondary | ICD-10-CM | POA: Diagnosis not present

## 2022-07-22 DIAGNOSIS — E113313 Type 2 diabetes mellitus with moderate nonproliferative diabetic retinopathy with macular edema, bilateral: Secondary | ICD-10-CM | POA: Diagnosis not present

## 2022-07-22 DIAGNOSIS — Z87891 Personal history of nicotine dependence: Secondary | ICD-10-CM | POA: Diagnosis not present

## 2022-07-22 DIAGNOSIS — E1142 Type 2 diabetes mellitus with diabetic polyneuropathy: Secondary | ICD-10-CM | POA: Diagnosis not present

## 2022-07-22 DIAGNOSIS — M810 Age-related osteoporosis without current pathological fracture: Secondary | ICD-10-CM | POA: Diagnosis not present

## 2022-07-22 DIAGNOSIS — T8131XD Disruption of external operation (surgical) wound, not elsewhere classified, subsequent encounter: Secondary | ICD-10-CM | POA: Diagnosis not present

## 2022-07-22 DIAGNOSIS — F419 Anxiety disorder, unspecified: Secondary | ICD-10-CM | POA: Diagnosis not present

## 2022-07-22 NOTE — Progress Notes (Deleted)
BH MD/PA/NP OP Progress Note  07/22/2022 8:04 AM Jocelyn Wells  MRN:  [id number]  Chief Complaint: No chief complaint on file.  HPI:  According to the chart review, the following events have occurred since the last visit: - The patient was seen by cardiology. Torsemide was uptitrated to 20 mg daily - She underwent left brachial cephalic AV fistula placement.  - she was seen by oncology for IDA with CKD, and had thrombocytosis work up  Nose bleed, hypotension  Visit Diagnosis: No diagnosis found. Please see initial evaluation for full details. I have reviewed the history. No updates at this time.    Past Psychiatric History: Please see initial evaluation for full details. I have reviewed the history. No updates at this time.     Past Medical History:  Past Medical History:  Diagnosis Date   Acute ischemic left MCA stroke (HCC) 01/29/2015   a.) MRI brain 01/29/2015: acute left MCA territory infarcts involving the posterior left frontal lobe and left insular cortex   Anemia of chronic renal failure    Anxiety    Arthritis    Complication of anesthesia    a.) PONV; b.) awareness under anesthesia   COPD (chronic obstructive pulmonary disease) (HCC)    Depression    Dyspnea    ESRD (end stage renal disease) (HCC)    Headache    HFrEF (heart failure with reduced ejection fraction) (HCC)    a.) TTE 01/30/2015: EF 30-35%, diff HK, LAE, G2DD; b.) TTE 06/13/2015: EF 40-45%, diff HK, G1DD; c.) TTE 10/06/2017: EF 60-65% MAC, no IAS; d.) TTE 09/29/2021: EF 45-50%;, LVH, LAE, AoV sclerosis, G1DD; e.) TTE 02/24/2022: EF 60-65%, no IAS   HTN (hypertension)    Leukocytosis    Long term current use of anticoagulant    a.) apixaban   Long term current use of antithrombotics/antiplatelets    a.) clopidogrel   NICM (nonischemic cardiomyopathy) (HCC)    a.) TTE 01/30/2015: EF 30-35%; b.) R/LHC 03/25/2015: EF 25-30%, mRA 13, mPA 30, mPCWP 26, LVEDP 17, CO 3.66, CI 1.8; c.) TTE 06/13/2015: EF  40-45%; d.) TTE 10/06/2017: EF 60-65%; e.) TTE 09/29/2021: EF 45-50%; f.) TTE 02/24/2022: EF 60-65%   Obesity    On supplemental oxygen by nasal cannula    a.) 2L/[place] at night   Osteoporosis    Peripheral vascular disease (HCC)    Type 2 diabetes mellitus treated with insulin [name]     Past Surgical History:  Procedure Laterality Date   ABDOMINAL HYSTERECTOMY     ANKLE FRACTURE SURGERY Left 02/09/2000   AV FISTULA PLACEMENT Left 06/18/2022   Procedure: ARTERIOVENOUS (AV) FISTULA CREATION (BRACHIAL AXILLA);  Surgeon: Renford Dills, MD;  Location: [facility] ORS;  Service: Vascular;  Laterality: Left;   BILATERAL SALPINGOOPHORECTOMY  02/08/1998   CARDIAC CATHETERIZATION N/A 03/25/2015   Procedure: Right/Left Heart Cath and Coronary Angiography;  Surgeon: Lyn Records, MD;  Location: [facility] INVASIVE CV LAB;  Service: Cardiovascular;  Laterality: N/A;   CESAREAN SECTION  02/09/1976   COLONOSCOPY WITH PROPOFOL N/A 09/22/2016   Procedure: COLONOSCOPY WITH PROPOFOL;  Surgeon: Scot Jun, MD;  Location: [facility] ENDOSCOPY;  Service: Endoscopy;  Laterality: N/A;   COLONOSCOPY WITH PROPOFOL N/A 02/15/2022   Procedure: COLONOSCOPY WITH PROPOFOL;  Surgeon: Toney Reil, MD;  Location: [facility] ENDOSCOPY;  Service: Gastroenterology;  Laterality: N/A;   EP IMPLANTABLE DEVICE N/A 08/05/2015   Procedure: Loop Recorder Insertion;  Surgeon: Duke Salvia, MD;  Location: [facility] INVASIVE CV LAB;  Service: Cardiovascular;  Laterality: N/A;   ESOPHAGOGASTRODUODENOSCOPY N/A 02/15/2022   Procedure: ESOPHAGOGASTRODUODENOSCOPY (EGD);  Surgeon: Toney Reil, MD;  Location: [facility] ENDOSCOPY;  Service: Gastroenterology;  Laterality: N/A;   ESOPHAGOGASTRODUODENOSCOPY (EGD) WITH PROPOFOL N/A 09/22/2016   Procedure: ESOPHAGOGASTRODUODENOSCOPY (EGD) WITH PROPOFOL;  Surgeon: Scot Jun, MD;  Location: [facility] ENDOSCOPY;  Service: Endoscopy;  Laterality: N/A;   ESOPHAGOGASTRODUODENOSCOPY (EGD) WITH PROPOFOL N/A  12/08/2016   Procedure: ESOPHAGOGASTRODUODENOSCOPY (EGD) WITH PROPOFOL;  Surgeon: Scot Jun, MD;  Location: [facility] ENDOSCOPY;  Service: Endoscopy;  Laterality: N/A;   FRACTURE SURGERY     GIVENS CAPSULE STUDY N/A 04/19/2022   Procedure: GIVENS CAPSULE STUDY;  Surgeon: Toney Reil, MD;  Location: [facility] ENDOSCOPY;  Service: Gastroenterology;  Laterality: N/A;   INSERTION HYBRID ANTERIOVENOUS GORTEX GRAFT  06/18/2022   Procedure: INSERTION HYBRID ANTERIOVENOUS GORTEX GRAFT;  Surgeon: Renford Dills, MD;  Location: [facility] ORS;  Service: Vascular;;   KNEE ARTHROSCOPY Left 02/09/2003   LOWER EXTREMITY ANGIOGRAPHY Left 08/12/2021   Procedure: Lower Extremity Angiography;  Surgeon: Renford Dills, MD;  Location: [facility] INVASIVE CV LAB;  Service: Cardiovascular;  Laterality: Left;   TEE WITHOUT CARDIOVERSION N/A 01/31/2015   Procedure: TRANSESOPHAGEAL ECHOCARDIOGRAM (TEE);  Surgeon: Lewayne Bunting, MD;  Location[facility] ENDOSCOPY;  Service: Cardiovascular;  Laterality: N/A;   TEMPORARY DIALYSIS CATHETER N/A 08/14/2021   Procedure: TEMPORARY DIALYSIS CATHETER;  Surgeon: Renford Dills, MD;  Location: [facility] INVASIVE CV LAB;  Service: Cardiovascular;  Laterality: N/A;   TUBAL LIGATION  02/09/1976   ULNAR NERVE TRANSPOSITION  02/08/2006    Family Psychiatric History: Please see initial evaluation for full details. I have reviewed the history. No updates at this time.     Family History:  Family History  Problem Relation Age of Onset   Heart disease Mother        died from CHF   Asthma Mother    Diabetes Mother    Heart disease Father    Aneurysm Father    COPD Brother    Diabetes Brother    Alcohol abuse Paternal Aunt    Cancer Maternal Grandmother        unknownorigin   Anemia Neg Hx    Arrhythmia Neg Hx    Clotting disorder Neg Hx    Fainting Neg Hx    Heart attack Neg Hx    Heart failure Neg Hx    Hyperlipidemia Neg Hx    Hypertension Neg Hx     Social History:   Social History   Socioeconomic History   Marital status: Married    Spouse name: [name]   Number of children: 2   Years of education: Not on file   Highest education level: Bachelor's degree (e.g., BA, AB, BS)  Occupational History   Occupation: disabled    Comment: retired  Tobacco Use   Smoking status: Former    Packs/day: 0.50    Years: 47.00    Additional pack years: 0.00    Total pack years: 23.50    Types: Cigarettes    Start date: 02/08/1974    Quit date: 05/31/2022    Years since quitting: 0.1   Smokeless tobacco: Never  Vaping Use   Vaping Use: Never used  Substance and Sexual Activity   Alcohol use: No    Alcohol/week: 0.0 standard drinks of alcohol   Drug use: No   Sexual activity: Yes    Birth control/protection: None  Other Topics Concern   Not on file  Social History Narrative   Lives at home with stepson and husband, dogs and cats   Caffeine  Drinks sweet tea.   Right handed.    Social Determinants of Health   Financial Resource Strain: Low Risk  (12/11/2021)   Overall Financial Resource Strain (CARDIA)    Difficulty of Paying Living Expenses: Not very hard  Food Insecurity: No Food Insecurity (01/05/2022)   Hunger Vital Sign    Worried About Running Out of Food in the Last Year: Never true    Ran Out of Food in the Last Year: Never true  Transportation Needs: No Transportation Needs (01/05/2022)   PRAPARE - Administrator, Civil Service (Medical): No    Lack of Transportation (Non-Medical): No  Physical Activity: Inactive (06/01/2022)   Exercise Vital Sign    Days of Exercise per Week: 0 days    Minutes of Exercise per Session: 0 min  Stress: No Stress Concern Present (06/01/2022)   [employer] of Occupational Health - Occupational Stress Questionnaire    Feeling of Stress : Only a little  Social Connections: Moderately Isolated (12/11/2021)   Social Connection and Isolation Panel [NHANES]    Frequency of Communication with  Friends and Family: More than three times a week    Frequency of Social Gatherings with Friends and Family: Once a week    Attends Religious Services: Never    Database administrator or Organizations: No    Attends Banker Meetings: Never    Marital Status: Married    Allergies:  Allergies  Allergen Reactions   Codeine Nausea And Vomiting   Ivp Dye [Iodinated Contrast Media]     Patients states the IV Dye shuts her kidneys down    Metabolic Disorder Labs: Lab Results  Component Value Date   HGBA1C 7.8 04/22/2022   MPG 246 05/31/2020   MPG 303.44 10/06/2017   No results found for: "PROLACTIN" Lab Results  Component Value Date   CHOL 107 12/04/2021   TRIG 112 12/04/2021   HDL 36 (L) 12/04/2021   CHOLHDL 3.0 12/04/2021   VLDL 64 (H) 10/06/2017   LDLCALC 50 12/04/2021   LDLCALC 186 (H) 10/20/2020   Lab Results  Component Value Date   TSH 2.977 10/06/2021    Therapeutic Level Labs: No results found for: "LITHIUM" No results found for: "VALPROATE" No results found for: "CBMZ"  Current Medications: Current Outpatient Medications  Medication Sig Dispense Refill   albuterol (VENTOLIN HFA) 108 (90 Base) MCG/ACT inhaler TAKE 2 PUFFS BY MOUTH EVERY 6 HOURS AS NEEDED FOR WHEEZE OR SHORTNESS OF BREATH (Patient taking differently: Inhale 2 puffs into the lungs every 6 (six) hours as needed for wheezing or shortness of breath.) 8.5 each 2   apixaban (ELIQUIS) 5 MG TABS tablet Take 1 tablet (5 mg total) by mouth 2 (two) times daily. 180 tablet 2   atorvastatin (LIPITOR) 40 MG tablet Take 1 tablet (40 mg total) by mouth daily. 90 tablet 3   buPROPion (WELLBUTRIN SR) 150 MG 12 hr tablet Take 1 tablet (150 mg total) by mouth 2 (two) times daily. 180 tablet 1   calcitRIOL (ROCALTROL) 0.25 MCG capsule Take 0.25 mcg by mouth daily.     doxycycline (VIBRAMYCIN) 100 MG capsule Take 1 capsule (100 mg total) by mouth 2 (two) times daily. 20 capsule 0   Dulaglutide (TRULICITY)  4.5 MG/0.5ML SOPN Inject 4.5 mg into the skin every 7 (seven) days. 6 mL 3   fluticasone (FLONASE)  50 MCG/ACT nasal spray Place 2 sprays into both nostrils daily. (Patient taking differently: Place 2 sprays into both nostrils as needed.) 16 g 6   gabapentin (NEURONTIN) 300 MG capsule Take 300 mg by mouth 2 (two) times daily.     glucose blood test strip OneTouch Verio strips     insulin aspart (NOVOLOG) 100 UNIT/ML injection Insulin pump     Insulin Disposable Pump (OMNIPOD DASH PODS, GEN 4,) MISC USE 1 POD EACH EVERY 72    HOURS     Insulin Human (INSULIN PUMP) SOLN Per endocrinoloy     loratadine (CLARITIN) 10 MG tablet Take 1 tablet by mouth daily.     meclizine (ANTIVERT) 12.5 MG tablet Take 1 tablet (12.5 mg total) by mouth 3 (three) times daily as needed for dizziness. 30 tablet 0   OXYGEN Place 2 L into the nose continuous.     PARoxetine (PAXIL) 40 MG tablet Take 1 tablet (40 mg total) by mouth daily. 90 tablet 1   Spacer/Aero-Holding Chambers (OPTICHAMBER DIAMOND) MISC Use with inhaler to ensure medication is delivered throughout lungs 1 each 0   torsemide (DEMADEX) 20 MG tablet Take 1 tablet (20 mg) by mouth once daily 270 tablet 1   traMADol (ULTRAM) 50 MG tablet Take 1 tablet (50 mg total) by mouth daily as needed. 30 tablet 2   Vitamin D, Ergocalciferol, (DRISDOL) 1.25 MG (50000 UNIT) CAPS capsule Take 50,000 Units by mouth every 7 (seven) days.     No current facility-administered medications for this visit.     Musculoskeletal: Strength & Muscle Tone: within normal limits Gait & Station: normal Patient leans: N/A  Psychiatric Specialty Exam: Review of Systems  There were no vitals taken for this visit.There is no height or weight on file to calculate BMI.  General Appearance: {Appearance:22683}  Eye Contact:  {BHH EYE CONTACT:22684}  Speech:  Clear and Coherent  Volume:  Normal  Mood:  {BHH MOOD:22306}  Affect:  {Affect (PAA):22687}  Thought Process:  Coherent   Orientation:  Full (Time, Place, and Person)  Thought Content: Logical   Suicidal Thoughts:  {ST/HT (PAA):22692}  Homicidal Thoughts:  {ST/HT (PAA):22692}  Memory:  Immediate;   Good  Judgement:  {Judgement (PAA):22694}  Insight:  {Insight (PAA):22695}  Psychomotor Activity:  Normal  Concentration:  Concentration: Good and Attention Span: Good  Recall:  Good  Fund of Knowledge: Good  Language: Good  Akathisia:  No  Handed:  {Handed:22697}  AIMS (if indicated): not done  Assets:  Communication Skills Desire for Improvement  ADL's:  Intact  Cognition: WNL  Sleep:  {BHH GOOD/FAIR/POOR:22877}   Screenings: GAD-7    Flowsheet Row Video Visit from 06/16/2022 in [facility] Video Visit from 05/19/2022 in [facility] Counselor from 02/17/2022 in [facility]  Total GAD-7 Score 2 6 6       PHQ2-9    Flowsheet Row Video Visit from 06/16/2022 in [facility] Office Visit from 06/01/2022 in [facility] Video Visit from 05/19/2022 in [facility] Counselor from 02/17/2022 in [facility] Office Visit from 01/18/2022 in [facility]  PHQ-2 Total Score 1 1 1 1  0  PHQ-9 Total Score 4 7 -- 4 6      Flowsheet Row Admission (Discharged) from 06/18/2022 in [facility] Pre-Admission Testing 60 from 06/07/2022  in [facility] PRE ADMISSION TEST[facility] Video Visit from 05/19/2022 in [facility]  C-SSRS RISK CATEGORY No Risk No Risk No Risk        Assessment and Plan:  Jocelyn Wells is a 67 y.o. year old female with a history of depression, type II DM, hypertension, hyperlipidemia, non ischemic cardiomyopathy,  CKD stage  IV, cryptogenic stroke, COPD, sleep apnea, back pain with T12-L1 compression fracture, T7-8 disc herniation with some spinal cord compression with no clinical myelopathy, who presents for follow up appointment for below.    1. MDD (major depressive disorder), recurrent, in partial remission (HCC) 2. PTSD (post-traumatic stress disorder) Acute stressors include:  Other stressors include: health related concern, unemployment, chronic pain, stroke    History:  on Paxil 40 mg since seen by Dr. Evelene Croon Although she reports occasional down mood and anxiety, this has been manageable since the last visit.  Will continue current dose of Paxil to target depression and PTSD.  She will greatly benefit from CBT; referral was made previously.    # Insomnia Improving. She reports history of snoring, daytime fatigue and insomnia.  Although she was strongly recommended to do a sleep evaluation, she declined this, referring to the evaluation she had few years ago.  Will continue to monitor.      Plan Continue Paxil 40 mg daily Referral to therapy Next appointment- 6/18 at 10 AM, IP   -on tramadol. Will monitor for serotonin syndrome   The patient demonstrates the following risk factors for suicide: Chronic risk factors for suicide include: psychiatric disorder of depression and chronic pain. Acute risk factors for suicide include: unemployment. Protective factors for this patient include: positive social support, coping skills, and hope for the future. Considering these factors, the overall suicide risk at this point appears to be low. Patient is appropriate for outpatient follow up.   Collaboration of Care: Collaboration of Care: {BH OP Collaboration of Care:21014065}  Patient/Guardian was advised Release of Information must be obtained prior to any record release in order to collaborate their care with an outside provider. Patient/Guardian was advised if they have not already done so to contact the registration  department to sign all necessary forms in order for [place] to release information regarding their care.   Consent: Patient/Guardian gives verbal consent for treatment and assignment of benefits for services provided during this visit. Patient/Guardian expressed understanding and agreed to proceed.    Neysa Hotter, MD 07/22/2022, 8:04 AM

## 2022-07-23 ENCOUNTER — Ambulatory Visit: Payer: Medicare Other | Admitting: Oncology

## 2022-07-26 ENCOUNTER — Ambulatory Visit: Payer: Medicare Other | Admitting: Oncology

## 2022-07-26 DIAGNOSIS — J449 Chronic obstructive pulmonary disease, unspecified: Secondary | ICD-10-CM | POA: Diagnosis not present

## 2022-07-26 DIAGNOSIS — E1142 Type 2 diabetes mellitus with diabetic polyneuropathy: Secondary | ICD-10-CM | POA: Diagnosis not present

## 2022-07-26 DIAGNOSIS — E1151 Type 2 diabetes mellitus with diabetic peripheral angiopathy without gangrene: Secondary | ICD-10-CM | POA: Diagnosis not present

## 2022-07-26 DIAGNOSIS — I132 Hypertensive heart and chronic kidney disease with heart failure and with stage 5 chronic kidney disease, or end stage renal disease: Secondary | ICD-10-CM | POA: Diagnosis not present

## 2022-07-26 DIAGNOSIS — I5022 Chronic systolic (congestive) heart failure: Secondary | ICD-10-CM | POA: Diagnosis not present

## 2022-07-26 DIAGNOSIS — T8131XD Disruption of external operation (surgical) wound, not elsewhere classified, subsequent encounter: Secondary | ICD-10-CM | POA: Diagnosis not present

## 2022-07-27 ENCOUNTER — Ambulatory Visit: Payer: Medicare Other | Admitting: Psychiatry

## 2022-07-27 ENCOUNTER — Other Ambulatory Visit
Admission: RE | Admit: 2022-07-27 | Discharge: 2022-07-27 | Disposition: A | Discharge status: Home / Self Care | Payer: Medicare Other | Attending: Cardiology | Admitting: Cardiology

## 2022-07-27 ENCOUNTER — Encounter: Payer: Self-pay | Admitting: Student in an Organized Health Care Education/Training Program

## 2022-07-27 ENCOUNTER — Ambulatory Visit (HOSPITAL_BASED_OUTPATIENT_CLINIC_OR_DEPARTMENT_OTHER): Payer: Medicare Other | Admitting: Student in an Organized Health Care Education/Training Program

## 2022-07-27 VITALS — BP 123/54 | HR 86 | Temp 97.2°F | Resp 18 | Ht 66.0 in | Wt 220.0 lb

## 2022-07-27 DIAGNOSIS — I428 Other cardiomyopathies: Secondary | ICD-10-CM | POA: Diagnosis not present

## 2022-07-27 DIAGNOSIS — I5032 Chronic diastolic (congestive) heart failure: Secondary | ICD-10-CM | POA: Diagnosis not present

## 2022-07-27 DIAGNOSIS — S22000A Wedge compression fracture of unspecified thoracic vertebra, initial encounter for closed fracture: Secondary | ICD-10-CM

## 2022-07-27 DIAGNOSIS — I959 Hypotension, unspecified: Secondary | ICD-10-CM | POA: Insufficient documentation

## 2022-07-27 DIAGNOSIS — M48061 Spinal stenosis, lumbar region without neurogenic claudication: Secondary | ICD-10-CM

## 2022-07-27 DIAGNOSIS — G894 Chronic pain syndrome: Secondary | ICD-10-CM | POA: Diagnosis not present

## 2022-07-27 DIAGNOSIS — Z79899 Other long term (current) drug therapy: Secondary | ICD-10-CM | POA: Insufficient documentation

## 2022-07-27 DIAGNOSIS — M48062 Spinal stenosis, lumbar region with neurogenic claudication: Secondary | ICD-10-CM

## 2022-07-27 DIAGNOSIS — S32010S Wedge compression fracture of first lumbar vertebra, sequela: Secondary | ICD-10-CM

## 2022-07-27 DIAGNOSIS — E1140 Type 2 diabetes mellitus with diabetic neuropathy, unspecified: Secondary | ICD-10-CM

## 2022-07-27 LAB — BASIC METABOLIC PANEL
Anion gap: 10 (ref 5–15)
BUN: 47 mg/dL — ABNORMAL HIGH (ref 8–23)
CO2: 22 mmol/L (ref 22–32)
Calcium: 7.1 mg/dL — ABNORMAL LOW (ref 8.9–10.3)
Chloride: 108 mmol/L (ref 98–111)
Creatinine, Ser: 3.11 mg/dL — ABNORMAL HIGH (ref 0.44–1.00)
GFR, Estimated: 16 mL/min — ABNORMAL LOW (ref >60)
Glucose, Bld: 126 mg/dL — ABNORMAL HIGH (ref 70–99)
Potassium: 3.8 mmol/L (ref 3.5–5.1)
Sodium: 140 mmol/L (ref 135–145)

## 2022-07-27 MED ORDER — TRAMADOL HCL 50 MG PO TABS: 50.0000 mg | ORAL_TABLET | Freq: Two times a day (BID) | ORAL | 2 refills | Status: DC | PRN

## 2022-07-27 NOTE — Progress Notes (Signed)
PROVIDER NOTE: Information contained herein reflects review and annotations entered in association with encounter. Interpretation of such information and data should be left to medically-trained personnel. Information provided to patient can be located elsewhere in the medical record under "Patient Instructions". Document created using STT-dictation technology, any transcriptional errors that may result from process are unintentional.    Patient: Jocelyn Wells  Service Category: E/M  Provider: Edward Jolly, MD  DOB: 03-May-1955  DOS: 07/27/2022  Referring Provider: Jacky Kindle, FNP  MRN: [id number]  Specialty: Interventional Pain Management  PCP: Jacky Kindle, FNP  Type: Established Patient  Setting: Ambulatory outpatient    Location: Office  Delivery: Face-to-face     HPI  [name], a 67 y.o. year old female, is here today because of her Chronic painful diabetic neuropathy (HCC) [E11.40]. Ms. Ringstad primary complain today is Foot Pain (bilat), Leg Pain (bilat), and Back Pain (lower)  Pertinent problems: Ms. Devico has H/O: osteoarthritis; Anxiety, generalized; Arthritis, degenerative; Morbid obesity due to excess calories (HCC); Compression fracture of L1 lumbar vertebra (HCC); Compression fracture of body of thoracic vertebra (HCC); Lumbar foraminal stenosis (RIGHT L1); and Spinal stenosis of lumbar region with neurogenic claudication (L4-L5) on their pertinent problem list. Pain Assessment: Severity of Neuropathic pain is reported as a 0-No pain/10. Location: Foot Right, Left/ . Onset: More than a month ago. Quality:  . Timing: Rarely. Modifying factor(s): Qutenza, meds, sitting. Vitals:  height is 5\' 6"  (1.676 m) and weight is 220 lb (99.8 kg). Her temperature is 97.2 F (36.2 C) (abnormal). Her blood pressure is 123/54 (abnormal) and her pulse is 86. Her respiration is 18 and oxygen saturation is 96%.  BMI: Estimated body mass index is 35.51 kg/m as calculated from the  following:   Height as of this encounter: 5\' 6"  (1.676 m).   Weight as of this encounter: 220 lb (99.8 kg). Last encounter: 05/11/2022. Last procedure: 06/09/2022.  Reason for encounter: both, medication management and post-procedure evaluation and assessment.    Post-procedure evaluation   Type: Qutenza Neurolysis #1  Laterality:  Bilateral Area treated: Feet Imaging Guidance: None Anesthesia/analgesia/anxiolysis/sedation: None required Medication (Right): Qutenza (capsaicin 8%) topical system Medication (Left): Qutenza (capsaicin 8%) topical system Date: 06/09/2022 Performed by: Edward Jolly, MD Rationale (medical necessity): procedure needed and proper for the treatment of Ms. Mcneese's medical symptoms and needs. Indication: Painful diabetic peripheral neuralgia (DPN) (ICD-10-CM:E11.40) severe enough to impact quality of life or function. 1. Chronic painful diabetic neuropathy (HCC)    NAS-11 Pain score:   Pre-procedure: 5 /10   Post-procedure: 5 /10      Effectiveness:  Initial hour after procedure: 90 %  Subsequent 4-6 hours post-procedure: 90 %  Analgesia past initial 6 hours: 100 %  Ongoing improvement:  Analgesic:  100% Function: Ms. Mccommons reports improvement in function ROM: Ms. Haidar reports improvement in ROM   Pharmacotherapy Assessment  Analgesic: Tramadol 50 mg twice daily as needed, quantity 60/month    Monitoring: [place] PMP: PDMP reviewed during this encounter.       Pharmacotherapy: No side-effects or adverse reactions reported. Compliance: No problems identified. Effectiveness: Clinically acceptable.  Nonah Mattes, RN  07/27/2022  2:17 PM  Sign when Signing Visit Safety precautions to be maintained throughout the outpatient stay will include: orient to surroundings, keep bed in low position, maintain call bell within reach at all times, provide assistance with transfer out of bed and ambulation.     No results found for: "CBDTHCR" No results  found  for: "D8THCCBX" No results found for: "D9THCCBX"  UDS:  Summary  Date Value Ref Range Status  11/03/2021 Note  Final    Comment:    ==================================================================== [employer] Select 13 (MW) ==================================================================== Test                             Result       Flag       Units  Drug Present and Declared for Prescription Verification   Tramadol                       8985         EXPECTED   ng/mg creat   O-Desmethyltramadol            5164         EXPECTED   ng/mg creat   N-Desmethyltramadol            2718         EXPECTED   ng/mg creat    Source of tramadol is a prescription medication. O-desmethyltramadol    and N-desmethyltramadol are expected metabolites of tramadol.  ==================================================================== Test                      Result    Flag   Units      Ref Range   Creatinine              33               mg/dL      >=81 ==================================================================== Declared Medications:  The flagging and interpretation on this report are based on the  following declared medications.  Unexpected results may arise from  inaccuracies in the declared medications.   **Note: The testing scope of this panel includes these medications:   Tramadol (Ultram)   **Note: The testing scope of this panel does not include the  following reported medications:   Albuterol (Ventolin HFA)  Apixaban (Eliquis)  Atorvastatin (Lipitor)  Carvedilol (Coreg)  Clopidogrel (Plavix)  Dulaglutide  Fluticasone (Flonase)  Gabapentin (Neurontin)  Insulin (NovoLog)  Loratadine (Claritin)  Losartan (Cozaar)  Pantoprazole (Protonix)  Paroxetine (Paxil)  Sulfadiazine (Silvadene)  Torsemide (Demadex) ==================================================================== For clinical consultation, please call [phone]. ====================================================================       ROS  Constitutional: Denies any fever or chills Gastrointestinal: No reported hemesis, hematochezia, vomiting, or acute GI distress Musculoskeletal: Denies any acute onset joint swelling, redness, loss of ROM, or weakness Neurological:  Improvement in bilateral feet paresthesias  Medication Review  Dulaglutide, Omnipod DASH Pods (Gen 4), Oxygen-Helium, PARoxetine, Vitamin D (Ergocalciferol), albuterol, apixaban, atorvastatin, buPROPion, calcitRIOL, doxycycline, fluticasone, gabapentin, glucose blood, insulin aspart, insulin pump, loratadine, meclizine, optichamber diamond, torsemide, and traMADol  History Review  Allergy: Ms. Klamm is allergic to codeine and ivp dye [iodinated contrast media]. Drug: Ms. Baiz  reports no history of drug use. Alcohol:  reports no history of alcohol use. Tobacco:  reports that she quit smoking about 8 weeks ago. Her smoking use included cigarettes. She started smoking about 48 years ago. She has a 23.50 pack-year smoking history. She has never used smokeless tobacco. Social: Ms. Sieling  reports that she quit smoking about 8 weeks ago. Her smoking use included cigarettes. She started smoking about 48 years ago. She has a 23.50 pack-year smoking history. She has never used smokeless tobacco. She reports that she does not drink alcohol and  does not use drugs. Medical:  has a past medical history of Acute ischemic left MCA stroke (HCC) (01/29/2015), Anemia of chronic renal failure, Anxiety, Arthritis, Complication of anesthesia, COPD (chronic obstructive pulmonary disease) (HCC), Depression, Dyspnea, ESRD (end stage renal disease) (HCC), Headache, HFrEF (heart failure with reduced ejection fraction) (HCC), HTN (hypertension), Leukocytosis, Long term current use of anticoagulant, Long term current use of antithrombotics/antiplatelets, NICM (nonischemic cardiomyopathy) (HCC),  Obesity, On supplemental oxygen by nasal cannula, Osteoporosis, Peripheral vascular disease (HCC), and Type 2 diabetes mellitus treated with insulin (HCC). Surgical: Ms. Noll  has a past surgical history that includes Abdominal hysterectomy; Tubal ligation (02/09/1976); Cesarean section (02/09/1976); Knee arthroscopy (Left, 02/09/2003); Ulnar nerve transposition (02/08/2006); Bilateral salpingoophorectomy (02/08/1998); Ankle fracture surgery (Left, 02/09/2000); TEE without cardioversion (N/A, 01/31/2015); Cardiac catheterization (N/A, 03/25/2015); Cardiac catheterization (N/A, 08/05/2015); Esophagogastroduodenoscopy (egd) with propofol (N/A, 09/22/2016); Colonoscopy with propofol (N/A, 09/22/2016); Esophagogastroduodenoscopy (egd) with propofol (N/A, 12/08/2016); Lower Extremity Angiography (Left, 08/12/2021); TEMPORARY DIALYSIS CATHETER (N/A, 08/14/2021); Fracture surgery; Colonoscopy with propofol (N/A, 02/15/2022); Esophagogastroduodenoscopy (N/A, 02/15/2022); Givens capsule study (N/A, 04/19/2022); AV fistula placement (Left, 06/18/2022); and Insertion hybrid anteriovenous gortex graft (06/18/2022). Family: family history includes Alcohol abuse in her paternal aunt; Aneurysm in her father; Asthma in her mother; COPD in her brother; Cancer in her maternal grandmother; Diabetes in her brother and mother; Heart disease in her father and mother.  Laboratory Chemistry Profile   Renal Lab Results  Component Value Date   BUN 47 (H) 07/27/2022   CREATININE 3.11 (H) 07/27/2022   LABCREA 3.7 04/22/2022   BCR 15 12/04/2021   GFRAA 33 (L) 04/23/2019   GFRNONAA 16 (L) 07/27/2022    Hepatic Lab Results  Component Value Date   AST 13 (L) 12/15/2021   ALT 10 12/15/2021   ALBUMIN 3.1 (L) 12/15/2021   ALKPHOS 81 12/15/2021    Electrolytes Lab Results  Component Value Date   NA 140 07/27/2022   K 3.8 07/27/2022   CL 108 07/27/2022   CALCIUM 7.1 (L) 07/27/2022   MG 2.0 01/04/2022   PHOS 3.8 08/20/2021     Bone No results found for: "VD25OH", "VD125OH2TOT", "ZO1096EA5", "WU9811BJ4", "25OHVITD1", "25OHVITD2", "25OHVITD3", "TESTOFREE", "TESTOSTERONE"  Inflammation (CRP: Acute Phase) (ESR: Chronic Phase) Lab Results  Component Value Date   ESRSEDRATE 23 10/20/2020   LATICACIDVEN 1.4 05/31/2020         Note: Above Lab results reviewed.  Recent Imaging Review  VAS US DUPLEX DIALYSIS ACCESS (AVF, AVG) DIALYSIS ACCESS  Patient Name:  SHINEQUA BAYONA Poppen  Date of Exam:   07/15/2022 Medical Rec #: [id number]       Accession #:    [id number] Date of Birth: 1955-04-26       Patient Gender: F Patient Age:   25 years Exam Location:  [facility] Procedure:      VAS US DUPLEX DIALYSIS ACCESS (AVF, AVG) Referring Phys: Levora Dredge  --------------------------------------------------------------------------------   Reason for Exam: Routine follow up.  Access Site: Left Upper Extremity.  Access Type: Upper arm straight graft.  Performing Technologist: Hardie Lora RVT    Examination Guidelines: A complete evaluation includes B-mode imaging, spectral Doppler, color Doppler, and power Doppler as needed of all accessible portions of each vessel. Unilateral testing is considered an integral part of a complete examination. Limited examinations for reoccurring indications may be performed as noted.    Findings:      +--------------------+----------+-----------------+--------+ AVG  PSV (cm/s)Flow Vol (mL/min)Describe +--------------------+----------+-----------------+--------+ Native artery inflow   285          1508                +--------------------+----------+-----------------+--------+ Arterial anastomosis   750                              +--------------------+----------+-----------------+--------+ Prox graft             256                              +--------------------+----------+-----------------+--------+ Mid graft               195                              +--------------------+----------+-----------------+--------+ Distal graft           136                              +--------------------+----------+-----------------+--------+ Venous anastomosis     365                              +--------------------+----------+-----------------+--------+ Venous outflow         273                              +--------------------+----------+-----------------+--------+    Summary: Patent arteriovenous graft.  *See table(s) above for measurements and observations.   Diagnosing physician: Levora Dredge MD Electronically signed by Levora Dredge MD on 07/22/2022 at 9:01:14 AM.         --------------------------------------------------------------------------------    Final   Note: Reviewed        Physical Exam  General appearance: Well nourished, well developed, and well hydrated. In no apparent acute distress Mental status: Alert, oriented x 3 (person, place, & time)       Respiratory: No evidence of acute respiratory distress Eyes: PERLA Vitals: BP (!) 123/54   Pulse 86   Temp (!) 97.2 F (36.2 C)   Resp 18   Ht 5\' 6"  (1.676 m)   Wt 220 lb (99.8 kg)   SpO2 96%   BMI 35.51 kg/m  BMI: Estimated body mass index is 35.51 kg/m as calculated from the following:   Height as of this encounter: 5\' 6"  (1.676 m).   Weight as of this encounter: 220 lb (99.8 kg). Ideal: Ideal body weight: 59.3 kg (130 lb 11.7 oz) Adjusted ideal body weight: 75.5 kg (166 lb 7 oz)  Assessment   Diagnosis Status  1. Chronic painful diabetic neuropathy (HCC)   2. Lumbar foraminal stenosis (RIGHT L1)   3. Spinal stenosis of lumbar region with neurogenic claudication (L4-L5)   4. Compression fracture of body of thoracic vertebra (HCC)   5. Compression fracture of L1 vertebra, sequela   6. Chronic pain syndrome    Controlled Controlled Controlled   Plan of Care  Patient found Qutenza beneficial for  paresthesias of bilateral feet.  She would like to reduce her gabapentin to 300 mg nightly to see if she can tolerate a lower dose given that Qutenza was beneficial for her bilateral neuropathic pain in her feet.  She  states that she is having weakness in her legs and having issues with balance.  She describes a burning and tingling sensation down her right posterior lateral leg.  She does have a history of lumbar foraminal stenosis.  Recommend updating lumbar MRI and then discussing treatment plan after that.  Refill tramadol as below.  Okay to reduce gabapentin to 300 mg nightly.  Repeat Qutenza after August 1.  Ms. MADILEE GRONAU has a current medication list which includes the following long-term medication(s): albuterol, apixaban, atorvastatin, bupropion, fluticasone, insulin pump, loratadine, paroxetine, optichamber diamond, and torsemide.    Pharmacotherapy (Medications Ordered): Meds ordered this encounter  Medications   DISCONTD: traMADol (ULTRAM) 50 MG tablet    Sig: Take 1 tablet (50 mg total) by mouth every 12 (twelve) hours as needed for severe pain.    Dispense:  60 tablet    Refill:  2    Fill one day early if pharmacy is closed on scheduled refill date. Do not fill until:  To last until:   traMADol (ULTRAM) 50 MG tablet    Sig: Take 1 tablet (50 mg total) by mouth every 12 (twelve) hours as needed for severe pain.    Dispense:  60 tablet    Refill:  2    Fill one day early if pharmacy is closed on scheduled refill date.   Orders:  Orders Placed This Encounter  Procedures   NEUROLYSIS    Please order Qutenza patches from pharmacy    Standing Status:   Future    Standing Expiration Date:   10/27/2022    Order Specific Question:   Where will this procedure be performed?    Answer:   [facility]   MR LUMBAR SPINE WO CONTRAST    Patient presents with axial pain with possible radicular component. Please assist [place] in identifying specific level(s) and laterality of any  additional findings such as: 1. Facet (Zygapophyseal) joint DJD (Hypertrophy, space narrowing, subchondral sclerosis, and/or osteophyte formation) 2. DDD and/or IVDD (Loss of disc height, desiccation, gas patterns, osteophytes, endplate sclerosis, or "Black disc disease") 3. Pars defects 4. Spondylolisthesis, spondylosis, and/or spondyloarthropathies (include Degree/Grade of displacement in mm) (stability) 5. Vertebral body Fractures (acute/chronic) (state percentage of collapse) 6. Demineralization (osteopenia/osteoporotic) 7. Bone pathology 8. Foraminal narrowing  9. Surgical changes 10. Central, Lateral Recess, and/or Foraminal Stenosis (include AP diameter of stenosis in mm) 11. Surgical changes (hardware type, status, and presence of fibrosis) 12. Modic Type Changes (MRI only) 13. IVDD (Disc bulge, protrusion, herniation, extrusion) (Level, laterality, extent)    Standing Status:   Future    Standing Expiration Date:   08/26/2022    Scheduling Instructions:     Please make sure that the patient understands that this needs to be done as soon as possible. Never have the patient do the imaging "just before the next appointment". Inform patient that having the imaging done within the [facility] will expedite the availability of the results and will provide      imaging availability to the requesting physician. In addition inform the patient that the imaging order has an expiration date and will not be renewed if not done within the active period.    Order Specific Question:   What is the patient's sedation requirement?    Answer:   No Sedation    Order Specific Question:   Does the patient have a pacemaker or implanted devices?    Answer:   No    Order Specific  Question:   Preferred imaging location?    Answer:   [facility] (table limit-350lbs)    Order Specific Question:   Call Results- Best Contact Number?    Answer:   [phone] [facility])    Order Specific  Question:   Radiology Contrast Protocol - do NOT remove file path    Answer:   [email or web address]   Follow-up plan:   Return in about 3 months (around 10/27/2022) for Medication Management, in person.      Recent Visits Date Type Provider Dept  06/09/22 Procedure visit Edward Jolly, MD [facility]  05/11/22 Office Visit Edward Jolly, MD [facility]  Showing recent visits within past 90 days and meeting all other requirements Today's Visits Date Type Provider Dept  07/27/22 Office Visit Edward Jolly, MD [facility]  Showing today's visits and meeting all other requirements Future Appointments Date Type Provider Dept  09/13/22 Appointment Edward Jolly, MD [facility]  10/19/22 Appointment Edward Jolly, MD [facility]  Showing future appointments within next 90 days and meeting all other requirements  I discussed the assessment and treatment plan with the patient. The patient was provided an opportunity to ask questions and all were answered. The patient agreed with the plan and demonstrated an understanding of the instructions.  Patient advised to call back or seek an in-person evaluation if the symptoms or condition worsens.  Duration of encounter: 30 minutes.  Total time on encounter, as per AMA guidelines included both the face-to-face and non-face-to-face time personally spent by the physician and/or other qualified health care professional(s) on the day of the encounter (includes time in activities that require the physician or other qualified health care professional and does not include time in activities normally performed by clinical staff). Physician's time may include the following activities when performed: Preparing to see the patient (e.g., pre-charting review of records, searching for previously ordered imaging, lab work, and nerve conduction tests) Review of prior analgesic  pharmacotherapies. Reviewing PMP Interpreting ordered tests (e.g., lab work, imaging, nerve conduction tests) Performing post-procedure evaluations, including interpretation of diagnostic procedures Obtaining and/or reviewing separately obtained history Performing a medically appropriate examination and/or evaluation Counseling and educating the patient/family/caregiver Ordering medications, tests, or procedures Referring and communicating with other health care professionals (when not separately reported) Documenting clinical information in the electronic or other health record Independently interpreting results (not separately reported) and communicating results to the patient/ family/caregiver Care coordination (not separately reported)  Note by: Edward Jolly, MD Date: 07/27/2022; Time: 3:30 PM

## 2022-07-27 NOTE — Progress Notes (Signed)
Safety precautions to be maintained throughout the outpatient stay will include: orient to surroundings, keep bed in low position, maintain call bell within reach at all times, provide assistance with transfer out of bed and ambulation.  

## 2022-07-29 DIAGNOSIS — E1151 Type 2 diabetes mellitus with diabetic peripheral angiopathy without gangrene: Secondary | ICD-10-CM | POA: Diagnosis not present

## 2022-07-29 DIAGNOSIS — E1142 Type 2 diabetes mellitus with diabetic polyneuropathy: Secondary | ICD-10-CM | POA: Diagnosis not present

## 2022-07-29 DIAGNOSIS — T8131XD Disruption of external operation (surgical) wound, not elsewhere classified, subsequent encounter: Secondary | ICD-10-CM | POA: Diagnosis not present

## 2022-07-29 DIAGNOSIS — J449 Chronic obstructive pulmonary disease, unspecified: Secondary | ICD-10-CM | POA: Diagnosis not present

## 2022-07-29 DIAGNOSIS — I5022 Chronic systolic (congestive) heart failure: Secondary | ICD-10-CM | POA: Diagnosis not present

## 2022-07-29 DIAGNOSIS — I132 Hypertensive heart and chronic kidney disease with heart failure and with stage 5 chronic kidney disease, or end stage renal disease: Secondary | ICD-10-CM | POA: Diagnosis not present

## 2022-08-02 DIAGNOSIS — E1151 Type 2 diabetes mellitus with diabetic peripheral angiopathy without gangrene: Secondary | ICD-10-CM | POA: Diagnosis not present

## 2022-08-02 DIAGNOSIS — E1142 Type 2 diabetes mellitus with diabetic polyneuropathy: Secondary | ICD-10-CM | POA: Diagnosis not present

## 2022-08-02 DIAGNOSIS — J449 Chronic obstructive pulmonary disease, unspecified: Secondary | ICD-10-CM | POA: Diagnosis not present

## 2022-08-02 DIAGNOSIS — I132 Hypertensive heart and chronic kidney disease with heart failure and with stage 5 chronic kidney disease, or end stage renal disease: Secondary | ICD-10-CM | POA: Diagnosis not present

## 2022-08-02 DIAGNOSIS — I5022 Chronic systolic (congestive) heart failure: Secondary | ICD-10-CM | POA: Diagnosis not present

## 2022-08-02 DIAGNOSIS — T8131XD Disruption of external operation (surgical) wound, not elsewhere classified, subsequent encounter: Secondary | ICD-10-CM | POA: Diagnosis not present

## 2022-08-03 ENCOUNTER — Encounter (INDEPENDENT_AMBULATORY_CARE_PROVIDER_SITE_OTHER): Payer: Self-pay | Admitting: Nurse Practitioner

## 2022-08-03 ENCOUNTER — Ambulatory Visit (INDEPENDENT_AMBULATORY_CARE_PROVIDER_SITE_OTHER): Payer: Medicare Other | Admitting: Nurse Practitioner

## 2022-08-03 ENCOUNTER — Ambulatory Visit: Payer: Medicare Other | Attending: Family | Admitting: Family

## 2022-08-03 ENCOUNTER — Encounter: Payer: Self-pay | Admitting: Family

## 2022-08-03 VITALS — BP 145/69 | HR 78 | Resp 16

## 2022-08-03 VITALS — BP 127/55 | HR 77 | Resp 14 | Wt 221.0 lb

## 2022-08-03 DIAGNOSIS — I5033 Acute on chronic diastolic (congestive) heart failure: Secondary | ICD-10-CM

## 2022-08-03 DIAGNOSIS — Z794 Long term (current) use of insulin: Secondary | ICD-10-CM | POA: Insufficient documentation

## 2022-08-03 DIAGNOSIS — E1151 Type 2 diabetes mellitus with diabetic peripheral angiopathy without gangrene: Secondary | ICD-10-CM | POA: Insufficient documentation

## 2022-08-03 DIAGNOSIS — I428 Other cardiomyopathies: Secondary | ICD-10-CM | POA: Insufficient documentation

## 2022-08-03 DIAGNOSIS — I5032 Chronic diastolic (congestive) heart failure: Secondary | ICD-10-CM

## 2022-08-03 DIAGNOSIS — J449 Chronic obstructive pulmonary disease, unspecified: Secondary | ICD-10-CM | POA: Insufficient documentation

## 2022-08-03 DIAGNOSIS — F32A Depression, unspecified: Secondary | ICD-10-CM | POA: Insufficient documentation

## 2022-08-03 DIAGNOSIS — I10 Essential (primary) hypertension: Secondary | ICD-10-CM

## 2022-08-03 DIAGNOSIS — I132 Hypertensive heart and chronic kidney disease with heart failure and with stage 5 chronic kidney disease, or end stage renal disease: Secondary | ICD-10-CM | POA: Diagnosis not present

## 2022-08-03 DIAGNOSIS — Z7985 Long-term (current) use of injectable non-insulin antidiabetic drugs: Secondary | ICD-10-CM | POA: Diagnosis not present

## 2022-08-03 DIAGNOSIS — E1122 Type 2 diabetes mellitus with diabetic chronic kidney disease: Secondary | ICD-10-CM | POA: Diagnosis not present

## 2022-08-03 DIAGNOSIS — Z8673 Personal history of transient ischemic attack (TIA), and cerebral infarction without residual deficits: Secondary | ICD-10-CM | POA: Insufficient documentation

## 2022-08-03 DIAGNOSIS — N184 Chronic kidney disease, stage 4 (severe): Secondary | ICD-10-CM | POA: Diagnosis not present

## 2022-08-03 DIAGNOSIS — I5022 Chronic systolic (congestive) heart failure: Secondary | ICD-10-CM | POA: Diagnosis not present

## 2022-08-03 DIAGNOSIS — M7989 Other specified soft tissue disorders: Secondary | ICD-10-CM

## 2022-08-03 DIAGNOSIS — I739 Peripheral vascular disease, unspecified: Secondary | ICD-10-CM

## 2022-08-03 DIAGNOSIS — D631 Anemia in chronic kidney disease: Secondary | ICD-10-CM | POA: Insufficient documentation

## 2022-08-03 DIAGNOSIS — Z87891 Personal history of nicotine dependence: Secondary | ICD-10-CM | POA: Insufficient documentation

## 2022-08-03 DIAGNOSIS — Z9981 Dependence on supplemental oxygen: Secondary | ICD-10-CM | POA: Insufficient documentation

## 2022-08-03 DIAGNOSIS — N186 End stage renal disease: Secondary | ICD-10-CM | POA: Diagnosis not present

## 2022-08-03 DIAGNOSIS — F419 Anxiety disorder, unspecified: Secondary | ICD-10-CM | POA: Insufficient documentation

## 2022-08-03 DIAGNOSIS — E1129 Type 2 diabetes mellitus with other diabetic kidney complication: Secondary | ICD-10-CM | POA: Diagnosis not present

## 2022-08-03 DIAGNOSIS — R0602 Shortness of breath: Secondary | ICD-10-CM | POA: Diagnosis not present

## 2022-08-03 NOTE — Progress Notes (Signed)
[facility] - Pharmacist Note  Jocelyn Wells is a 66 y.o. female with HFimpEF (baseline EF <40% with a follow-up EF >40%) presenting to the Heart Failure Clinic for follow up. She is here today with family members that help manage her medications. They report no medication access issues. Patient weighs herself at home daily with [facility]. They report that she has not yet started HD, but she is getting set up for dialysis soon. GDMT is largely limited by renal function and hypotension. She reports some shortness of breath that she attributes to allergies or a cold. She also having some ongoing issues with leg swelling. She has been getting her legs wrapped for the past 3 weeks or so. She and family report no specific fluid restriction per nephrology at this point.   Recent ED Visit (past 6 months):  Date: 02/26/2022, CC: abnormal labs  Guideline-Directed Medical Therapy/Evidence Based Medicine ACE/ARB/ARNI: none Beta Blocker:  none Aldosterone Antagonist: none Diuretic: Torsemide 20 mg daily SGLT2i:  none  Adherence Assessment Do you ever forget to take your medication? [] Yes [x] No  Do you ever skip doses due to side effects? [] Yes [x] No  Do you have trouble affording your medicines? [] Yes [x] No  Are you ever unable to pick up your medication due to transportation difficulties? [] Yes [x] No  Do you ever stop taking your medications because you don't believe they are helping? [] Yes [x] No  Do you check your weight daily? [x] Yes [] No  Adherence strategy: pill box Barriers to obtaining medications: none reported  Diagnostics ECHO: Date 09/29/2021, EF 45-50%, no RWMA, moderate LVH, G1DD  Vitals    07/27/2022    2:18 PM 07/15/2022    3:03 PM 07/09/2022   10:18 AM  Vitals with BMI  Height 5\' 6"   5\' 6"   Weight 220 lbs  230 lbs  BMI 35.53  37.14  Systolic 123 112 98  Diastolic 54 61 56  Pulse 86 82 75     Recent Labs    Latest Ref Rng & Units 07/27/2022   10:55  AM 06/18/2022    7:14 AM 02/26/2022    4:25 PM  BMP  Glucose 70 - 99 mg/dL 956  213  086   BUN 8 - 23 mg/dL 47  49  52   Creatinine 0.44 - 1.00 mg/dL 5.78  4.69  6.29   Sodium 135 - 145 mmol/L 140  140  135   Potassium 3.5 - 5.1 mmol/L 3.8  4.2  5.0   Chloride 98 - 111 mmol/L 108  106  102   CO2 22 - 32 mmol/L 22   26   Calcium 8.9 - 10.3 mg/dL 7.1   9.0     Past Medical History Past Medical History:  Diagnosis Date   Acute ischemic left MCA stroke (HCC) 01/29/2015   a.) MRI brain 01/29/2015: acute left MCA territory infarcts involving the posterior left frontal lobe and left insular cortex   Anemia of chronic renal failure    Anxiety    Arthritis    Complication of anesthesia    a.) PONV; b.) awareness under anesthesia   COPD (chronic obstructive pulmonary disease) (HCC)    Depression    Dyspnea    ESRD (end stage renal disease) (HCC)    Headache    HFrEF (heart failure with reduced ejection fraction) (HCC)    a.) TTE 01/30/2015: EF 30-35%, diff HK, LAE, G2DD; b.) TTE 06/13/2015: EF 40-45%, diff HK, G1DD; c.) TTE 10/06/2017: EF  60-65% MAC, no IAS; d.) TTE 09/29/2021: EF 45-50%;, LVH, LAE, AoV sclerosis, G1DD; e.) TTE 02/24/2022: EF 60-65%, no IAS   HTN (hypertension)    Leukocytosis    Long term current use of anticoagulant    a.) apixaban   Long term current use of antithrombotics/antiplatelets    a.) clopidogrel   NICM (nonischemic cardiomyopathy) (HCC)    a.) TTE 01/30/2015: EF 30-35%; b.) R/LHC 03/25/2015: EF 25-30%, mRA 13, mPA 30, mPCWP 26, LVEDP 17, CO 3.66, CI 1.8; c.) TTE 06/13/2015: EF 40-45%; d.) TTE 10/06/2017: EF 60-65%; e.) TTE 09/29/2021: EF 45-50%; f.) TTE 02/24/2022: EF 60-65%   Obesity    On supplemental oxygen by nasal cannula    a.) 2L/[place] at night   Osteoporosis    Peripheral vascular disease (HCC)    Type 2 diabetes mellitus treated with insulin (HCC)     Plan Continue regimen as directed by NP ReDS today 40 Adjust diuretic regimen per NP Annual  echo due 09/2022  Time spent: 10 minutes  Celene Squibb, PharmD Clinical Pharmacist 08/03/2022 2:09 PM

## 2022-08-03 NOTE — Patient Instructions (Signed)
Take 2 torsemide tablets tomorrow (Wed) and then again on Thursday. Beginning on Friday, resume taking just 1 torsemide tablet daily  Measure your cup at home to see how many ounces it holds so that you can keep your daily fluid intake to close to 40 ounces. Remember than 1 cup of watermelon = 1/2 cup of liquid.

## 2022-08-03 NOTE — Progress Notes (Signed)
PCP: Merita Norton, FNP (last seen 12/23) Primary Cardiologist: Debbe Odea, MD (last seen 05/24)  HPI:  Ms Noack is a 67 y/o female with a history of DM, HTN, CKD, stroke, anemia, anxiety, COPD, depression, leukocytosis, tobacco use and chronic heart failure.   Echo 02/24/22: EF 60-65%. Echo 09/29/21: EF of 45-50% along with moderate LVH & mild LAE.   RHC/LHC 03/25/15: Severe left ventricular systolic dysfunction with EF 25-30% Elevated pulmonary Order wedge pressure and upper normal to mildly elevated LVEDP  Normal coronary arteries.  Severe systolic left ventricular dysfunction with EF 25-30%. Mildly elevated LVEDP and moderate pulmonary capillary wedge elevation.  Was in the ED 02/26/22 due to abnormal labs. Admitted 01/01/22 due to fatigue and dyspnea, also increasing bilateral lower extremity swelling. On presentation she was requiring 2 L of oxygen to maintain oxygen saturation.  Chest x-ray showed cardiomegaly, mild vascular congestion, bibasilar edema. Needed blood transfusion due to anemia. Initially given IV lasix. Transitioned to oral diuretics. Wound consult done. Discharged after 3 days. Was in the ED 12/07/21 due to abnormal labs from PCP office but LWBS.   She presents today for a HF follow-up visit with a chief complaint of moderate SOB with minimal exertion. Chronic in nature. Has associated cough, fatigue and pedal edema along with this. Denies chest pain, palpitations, dizziness or difficulty sleeping. Has bilateral lower legs wrapped in UNNA wraps & she says that they get changes twice weekly by home health nurse.  Voices frustration that her cataract surgery wasn't done due to hypotension. Son says that her BP was 90/60. Patient says that everything looks blurry and she has no depth perception. Is supposed to be using her walker all the time but doesn't, especially when she's inside the house. Admits to falling both inside and outside the home.    Supposed to be  wearing oxygen around the clock but she currently only wears it at bedtime as she's waiting on a portable oxygen tank.  Had had graft placed in her left arm 06/18/22 for upcoming dialysis although patient doesn't have good insight into this. She is hopeful that she doesn't have to do dialysis even though her sons have tried to prepare her for this outcome. She is unsure of her daily fluid intake.   ROS: All systems negative except as listed in HPI, PMH and Problem List.  SH:  Social History   Socioeconomic History   Marital status: Married    Spouse name: [name]   Number of children: 2   Years of education: Not on file   Highest education level: Bachelor's degree (e.g., BA, AB, BS)  Occupational History   Occupation: disabled    Comment: retired  Tobacco Use   Smoking status: Former    Packs/day: 0.50    Years: 47.00    Additional pack years: 0.00    Total pack years: 23.50    Types: Cigarettes    Start date: 02/08/1974    Quit date: 05/31/2022    Years since quitting: 0.1   Smokeless tobacco: Never  Vaping Use   Vaping Use: Never used  Substance and Sexual Activity   Alcohol use: No    Alcohol/week: 0.0 standard drinks of alcohol   Drug use: No   Sexual activity: Yes    Birth control/protection: None  Other Topics Concern   Not on file  Social History Narrative   Lives at home with stepson and husband, dogs and cats   Caffeine  Drinks sweet tea.   Right  handed.    Social Determinants of Health   Financial Resource Strain: Low Risk  (12/11/2021)   Overall Financial Resource Strain (CARDIA)    Difficulty of Paying Living Expenses: Not very hard  Food Insecurity: No Food Insecurity (01/05/2022)   Hunger Vital Sign    Worried About Running Out of Food in the Last Year: Never true    Ran Out of Food in the Last Year: Never true  Transportation Needs: No Transportation Needs (01/05/2022)   PRAPARE - Administrator, Civil Service (Medical): No    Lack of  Transportation (Non-Medical): No  Physical Activity: Inactive (06/01/2022)   Exercise Vital Sign    Days of Exercise per Week: 0 days    Minutes of Exercise per Session: 0 min  Stress: No Stress Concern Present (06/01/2022)   [employer] of Occupational Health - Occupational Stress Questionnaire    Feeling of Stress : Only a little  Social Connections: Moderately Isolated (12/11/2021)   Social Connection and Isolation Panel [NHANES]    Frequency of Communication with Friends and Family: More than three times a week    Frequency of Social Gatherings with Friends and Family: Once a week    Attends Religious Services: Never    Database administrator or Organizations: No    Attends Banker Meetings: Never    Marital Status: Married  Catering manager Violence: Not At Risk (01/02/2022)   Humiliation, Afraid, Rape, and Kick questionnaire    Fear of Current or Ex-Partner: No    Emotionally Abused: No    Physically Abused: No    Sexually Abused: No    FH:  Family History  Problem Relation Age of Onset   Heart disease Mother        died from CHF   Asthma Mother    Diabetes Mother    Heart disease Father    Aneurysm Father    COPD Brother    Diabetes Brother    Alcohol abuse Paternal Aunt    Cancer Maternal Grandmother        unknownorigin   Anemia Neg Hx    Arrhythmia Neg Hx    Clotting disorder Neg Hx    Fainting Neg Hx    Heart attack Neg Hx    Heart failure Neg Hx    Hyperlipidemia Neg Hx    Hypertension Neg Hx     Past Medical History:  Diagnosis Date   Acute ischemic left MCA stroke (HCC) 01/29/2015   a.) MRI brain 01/29/2015: acute left MCA territory infarcts involving the posterior left frontal lobe and left insular cortex   Anemia of chronic renal failure    Anxiety    Arthritis    Complication of anesthesia    a.) PONV; b.) awareness under anesthesia   COPD (chronic obstructive pulmonary disease) (HCC)    Depression    Dyspnea    ESRD (end  stage renal disease) (HCC)    Headache    HFrEF (heart failure with reduced ejection fraction) (HCC)    a.) TTE 01/30/2015: EF 30-35%, diff HK, LAE, G2DD; b.) TTE 06/13/2015: EF 40-45%, diff HK, G1DD; c.) TTE 10/06/2017: EF 60-65% MAC, no IAS; d.) TTE 09/29/2021: EF 45-50%;, LVH, LAE, AoV sclerosis, G1DD; e.) TTE 02/24/2022: EF 60-65%, no IAS   HTN (hypertension)    Leukocytosis    Long term current use of anticoagulant    a.) apixaban   Long term current use of antithrombotics/antiplatelets    a.)  clopidogrel   NICM (nonischemic cardiomyopathy) (HCC)    a.) TTE 01/30/2015: EF 30-35%; b.) R/LHC 03/25/2015: EF 25-30%, mRA 13, mPA 30, mPCWP 26, LVEDP 17, CO 3.66, CI 1.8; c.) TTE 06/13/2015: EF 40-45%; d.) TTE 10/06/2017: EF 60-65%; e.) TTE 09/29/2021: EF 45-50%; f.) TTE 02/24/2022: EF 60-65%   Obesity    On supplemental oxygen by nasal cannula    a.) 2L/[place] at night   Osteoporosis    Peripheral vascular disease (HCC)    Type 2 diabetes mellitus treated with insulin (HCC)     Current Outpatient Medications  Medication Sig Dispense Refill   albuterol (VENTOLIN HFA) 108 (90 Base) MCG/ACT inhaler TAKE 2 PUFFS BY MOUTH EVERY 6 HOURS AS NEEDED FOR WHEEZE OR SHORTNESS OF BREATH (Patient taking differently: Inhale 2 puffs into the lungs every 6 (six) hours as needed for wheezing or shortness of breath.) 8.5 each 2   apixaban (ELIQUIS) 5 MG TABS tablet Take 1 tablet (5 mg total) by mouth 2 (two) times daily. 180 tablet 2   atorvastatin (LIPITOR) 40 MG tablet Take 1 tablet (40 mg total) by mouth daily. 90 tablet 3   buPROPion (WELLBUTRIN SR) 150 MG 12 hr tablet Take 1 tablet (150 mg total) by mouth 2 (two) times daily. 180 tablet 1   calcitRIOL (ROCALTROL) 0.25 MCG capsule Take 0.25 mcg by mouth daily.     doxycycline (VIBRAMYCIN) 100 MG capsule Take 1 capsule (100 mg total) by mouth 2 (two) times daily. 20 capsule 0   Dulaglutide (TRULICITY) 4.5 MG/0.5ML SOPN Inject 4.5 mg into the skin every 7  (seven) days. 6 mL 3   fluticasone (FLONASE) 50 MCG/ACT nasal spray Place 2 sprays into both nostrils daily. (Patient taking differently: Place 2 sprays into both nostrils as needed.) 16 g 6   gabapentin (NEURONTIN) 300 MG capsule Take 300 mg by mouth 2 (two) times daily.     glucose blood test strip OneTouch Verio strips     insulin aspart (NOVOLOG) 100 UNIT/ML injection Insulin pump     Insulin Disposable Pump (OMNIPOD DASH PODS, GEN 4,) MISC USE 1 POD EACH EVERY 72    HOURS     Insulin Human (INSULIN PUMP) SOLN Per endocrinoloy     loratadine (CLARITIN) 10 MG tablet Take 1 tablet by mouth daily.     meclizine (ANTIVERT) 12.5 MG tablet Take 1 tablet (12.5 mg total) by mouth 3 (three) times daily as needed for dizziness. (Patient not taking: Reported on 07/27/2022) 30 tablet 0   OXYGEN Place 2 L into the nose continuous.     PARoxetine (PAXIL) 40 MG tablet Take 1 tablet (40 mg total) by mouth daily. 90 tablet 1   Spacer/Aero-Holding Chambers (OPTICHAMBER DIAMOND) MISC Use with inhaler to ensure medication is delivered throughout lungs 1 each 0   torsemide (DEMADEX) 20 MG tablet Take 1 tablet (20 mg) by mouth once daily 270 tablet 1   traMADol (ULTRAM) 50 MG tablet Take 1 tablet (50 mg total) by mouth every 12 (twelve) hours as needed for severe pain. 60 tablet 2   Vitamin D, Ergocalciferol, (DRISDOL) 1.25 MG (50000 UNIT) CAPS capsule Take 50,000 Units by mouth every 7 (seven) days.     No current facility-administered medications for this visit.   Vitals:   08/03/22 1413  BP: (!) 127/55  Pulse: 77  Resp: 14  SpO2: 92%  Weight: 221 lb (100.2 kg)   Wt Readings from Last 3 Encounters:  08/03/22 221 lb (100.2 kg)  07/27/22 220 lb (99.8 kg)  07/09/22 230 lb (104.3 kg)   Lab Results  Component Value Date   CREATININE 3.11 (H) 07/27/2022   CREATININE 3.60 (H) 06/18/2022   CREATININE 3.27 (H) 02/26/2022   PHYSICAL EXAM:  General:  Well appearing. No resp difficulty HEENT:  normal Neck: supple. JVP flat. No lymphadenopathy or thryomegaly appreciated. Cor: PMI normal. Regular rate & rhythm. No rubs, gallops or murmurs. Lungs: few rales in LUL Abdomen: soft, nontender, nondistended. No hepatosplenomegaly. No bruits or masses. Good bowel sounds. Extremities: no cyanosis, clubbing, rash, both lower legs wrapped in unna boots, left arm with bandage where graft for dialysis is at Neuro: alert & oriented x3, cranial nerves grossly intact. Moves all 4 extremities w/o difficulty. Affect pleasant.   ECG: 06/07/22 was NSR  ReDs: 40%   ASSESSMENT & PLAN:  1: NICM with preserved ejection fraction- - suspect due to CKD as cath in 2017 showed normal coronary arteries - NYHA class III - mildly fluid overloaded today with cough, weight gain and elevated ReDs reading - weighing daily; reminded to call for an overnight weight gain of > 2 pounds or a weekly weight gain of > 5 pounds - weight up 10 pounds from last visit here 3 months ago - ReDs 40% - instructed to take additional 20mg  torsemide (40mg  daily) for the next 2 days and then resume her normal dose of 20mg  daily - echo 02/24/22: EF 60-65%. Echo 09/29/21: EF of 45-50% along with moderate LVH & mild LAE.  - RHC/LHC 03/25/15: Severe left ventricular systolic dysfunction with EF 25-30%. Elevated pulmonary Order wedge pressure and upper normal to mildly elevated LVEDP. Normal coronary arteries. Severe systolic left ventricular dysfunction with EF 25-30%. Mildly elevated LVEDP and moderate pulmonary capillary wedge elevation. - not adding salt to her food - emphasized knowing how much fluids she is taking in; measure her cup so she knows how many ounces in the cup; keep daily fluid intake to 40 ounces unless told otherwise by nephrology - continue torsemide 20mg  daily after she finishes the 2 days of 40mg  torsemide - due to renal function, not a candidate for spironolactone  - consider resuming low dose beta-blocker if BP  remains good; son reports it was 90/60 so her cataract surgery was cancelled - emphasized using her walker whenever she is walking - saw cardiology (Agbor-Etang) 05/24 - saw [facility] provider (Bensimhon) 12/23 - has lower legs wrapped in UNNA boots and being changed by home health nurse - BNP 01/26/22 was 2195.7 - participating in [facility] - PharmD reconciled meds w/ patient  2: HTN with CKD- - BP 127/55 - saw PCP Suzie Portela) 01/18/22 - BMP 07/27/22 reviewed and showed sodium 140, potassium 3.8, creatinine 3.11 & GFR 16 - saw nephrology Thedore Mins) 05/24  3: COPD- - saw pulmonology Jayme Cloud) 12/23 - wears oxygen at 2L at bedtime - waiting on portable tank so that she can wear oxygen 24/7  4: Anemia- - went to cancer center on 03/31/22 - hemoglobin 06/18/22 was 10.5 - received iron infusion 03/22/22  5: DM- - saw endocrinology (Solum) 06/24 - A1c 04/22/22 was 7.8; previously was 10.1%  6: PAD- - saw vascular Manson Passey) 06/24; returns today - had graft in left arm placed 06/18/22  Return in 2 weeks, sooner if needed.

## 2022-08-03 NOTE — Progress Notes (Signed)
   08/03/22 1448  ReDS Vest / Clip  Station Marker D  Ruler Value 36  ReDS Value Range (!) > 40  ReDS Actual Value 40

## 2022-08-04 ENCOUNTER — Encounter (INDEPENDENT_AMBULATORY_CARE_PROVIDER_SITE_OTHER): Payer: Self-pay | Admitting: Nurse Practitioner

## 2022-08-04 DIAGNOSIS — H2513 Age-related nuclear cataract, bilateral: Secondary | ICD-10-CM | POA: Diagnosis not present

## 2022-08-04 DIAGNOSIS — H31013 Macula scars of posterior pole (postinflammatory) (post-traumatic), bilateral: Secondary | ICD-10-CM | POA: Diagnosis not present

## 2022-08-04 DIAGNOSIS — E113513 Type 2 diabetes mellitus with proliferative diabetic retinopathy with macular edema, bilateral: Secondary | ICD-10-CM | POA: Diagnosis not present

## 2022-08-04 DIAGNOSIS — H43811 Vitreous degeneration, right eye: Secondary | ICD-10-CM | POA: Diagnosis not present

## 2022-08-04 NOTE — Progress Notes (Signed)
Subjective:    Patient ID: Jocelyn Wells, female    DOB: 08-20-1955, 67 y.o.   MRN: [id number] Chief Complaint  Patient presents with   Follow-up    2-3 week follow up    The patient returns today for follow-up evaluation of her left upper extremity brachial axillary AV graft.  She previously had a small dehiscence of the incision area.  This is largely healed with just a small area of scabbing.  Additionally she has some issues with lower extremity edema and weeping.  Today the lower extremity edema is much improved.  She had been Unna boots for several weeks and tolerated this well.    Review of Systems  Cardiovascular:  Positive for leg swelling.  Skin:  Positive for wound.  All other systems reviewed and are negative.      Objective:   Physical Exam Vitals reviewed.  HENT:     Head: Normocephalic.  Cardiovascular:     Rate and Rhythm: Normal rate.     Pulses:          Radial pulses are 2+ on the left side.  Pulmonary:     Effort: Pulmonary effort is normal.  Musculoskeletal:     Right lower leg: 2+ Pitting Edema present.  Skin:    General: Skin is warm and dry.  Neurological:     Mental Status: She is alert and oriented to person, place, and time.  Psychiatric:        Mood and Affect: Mood normal.        Behavior: Behavior normal.        Thought Content: Thought content normal.        Judgment: Judgment normal.     BP (!) 145/69 (BP Location: Right Arm)   Pulse 78   Resp 16   Past Medical History:  Diagnosis Date   Acute ischemic left MCA stroke (HCC) 01/29/2015   a.) MRI brain 01/29/2015: acute left MCA territory infarcts involving the posterior left frontal lobe and left insular cortex   Anemia of chronic renal failure    Anxiety    Arthritis    Complication of anesthesia    a.) PONV; b.) awareness under anesthesia   COPD (chronic obstructive pulmonary disease) (HCC)    Depression    Dyspnea    ESRD (end stage renal disease) (HCC)    Headache     HFrEF (heart failure with reduced ejection fraction) (HCC)    a.) TTE 01/30/2015: EF 30-35%, diff HK, LAE, G2DD; b.) TTE 06/13/2015: EF 40-45%, diff HK, G1DD; c.) TTE 10/06/2017: EF 60-65% MAC, no IAS; d.) TTE 09/29/2021: EF 45-50%;, LVH, LAE, AoV sclerosis, G1DD; e.) TTE 02/24/2022: EF 60-65%, no IAS   HTN (hypertension)    Leukocytosis    Long term current use of anticoagulant    a.) apixaban   Long term current use of antithrombotics/antiplatelets    a.) clopidogrel   NICM (nonischemic cardiomyopathy) (HCC)    a.) TTE 01/30/2015: EF 30-35%; b.) R/LHC 03/25/2015: EF 25-30%, mRA 13, mPA 30, mPCWP 26, LVEDP 17, CO 3.66, CI 1.8; c.) TTE 06/13/2015: EF 40-45%; d.) TTE 10/06/2017: EF 60-65%; e.) TTE 09/29/2021: EF 45-50%; f.) TTE 02/24/2022: EF 60-65%   Obesity    On supplemental oxygen by nasal cannula    a.) 2L/[place] at night   Osteoporosis    Peripheral vascular disease (HCC)    Type 2 diabetes mellitus treated with insulin (HCC)     Social History  Socioeconomic History   Marital status: Married    Spouse name: [name]   Number of children: 2   Years of education: Not on file   Highest education level: Bachelor's degree (e.g., BA, AB, BS)  Occupational History   Occupation: disabled    Comment: retired  Tobacco Use   Smoking status: Former    Packs/day: 0.50    Years: 47.00    Additional pack years: 0.00    Total pack years: 23.50    Types: Cigarettes    Start date: 02/08/1974    Quit date: 05/31/2022    Years since quitting: 0.1   Smokeless tobacco: Never  Vaping Use   Vaping Use: Never used  Substance and Sexual Activity   Alcohol use: No    Alcohol/week: 0.0 standard drinks of alcohol   Drug use: No   Sexual activity: Yes    Birth control/protection: None  Other Topics Concern   Not on file  Social History Narrative   Lives at home with stepson and husband, dogs and cats   Caffeine  Drinks sweet tea.   Right handed.    Social Determinants of Health   Financial  Resource Strain: Low Risk  (12/11/2021)   Overall Financial Resource Strain (CARDIA)    Difficulty of Paying Living Expenses: Not very hard  Food Insecurity: No Food Insecurity (01/05/2022)   Hunger Vital Sign    Worried About Running Out of Food in the Last Year: Never true    Ran Out of Food in the Last Year: Never true  Transportation Needs: No Transportation Needs (01/05/2022)   PRAPARE - Administrator, Civil Service (Medical): No    Lack of Transportation (Non-Medical): No  Physical Activity: Inactive (06/01/2022)   Exercise Vital Sign    Days of Exercise per Week: 0 days    Minutes of Exercise per Session: 0 min  Stress: No Stress Concern Present (06/01/2022)   [employer] of Occupational Health - Occupational Stress Questionnaire    Feeling of Stress : Only a little  Social Connections: Moderately Isolated (12/11/2021)   Social Connection and Isolation Panel [NHANES]    Frequency of Communication with Friends and Family: More than three times a week    Frequency of Social Gatherings with Friends and Family: Once a week    Attends Religious Services: Never    Database administrator or Organizations: No    Attends Banker Meetings: Never    Marital Status: Married  Catering manager Violence: Not At Risk (01/02/2022)   Humiliation, Afraid, Rape, and Kick questionnaire    Fear of Current or Ex-Partner: No    Emotionally Abused: No    Physically Abused: No    Sexually Abused: No    Past Surgical History:  Procedure Laterality Date   ABDOMINAL HYSTERECTOMY     ANKLE FRACTURE SURGERY Left 02/09/2000   AV FISTULA PLACEMENT Left 06/18/2022   Procedure: ARTERIOVENOUS (AV) FISTULA CREATION (BRACHIAL AXILLA);  Surgeon: Renford Dills, MD;  Location: [facility] ORS;  Service: Vascular;  Laterality: Left;   BILATERAL SALPINGOOPHORECTOMY  02/08/1998   CARDIAC CATHETERIZATION N/A 03/25/2015   Procedure: Right/Left Heart Cath and Coronary Angiography;  Surgeon:  Lyn Records, MD;  Location: [facility] INVASIVE CV LAB;  Service: Cardiovascular;  Laterality: N/A;   CESAREAN SECTION  02/09/1976   COLONOSCOPY WITH PROPOFOL N/A 09/22/2016   Procedure: COLONOSCOPY WITH PROPOFOL;  Surgeon: Scot Jun, MD;  Location: [facility] ENDOSCOPY;  Service: Endoscopy;  Laterality:  N/A;   COLONOSCOPY WITH PROPOFOL N/A 02/15/2022   Procedure: COLONOSCOPY WITH PROPOFOL;  Surgeon: Toney Reil, MD;  Location: [facility] ENDOSCOPY;  Service: Gastroenterology;  Laterality: N/A;   EP IMPLANTABLE DEVICE N/A 08/05/2015   Procedure: Loop Recorder Insertion;  Surgeon: Duke Salvia, MD;  Location: [facility] INVASIVE CV LAB;  Service: Cardiovascular;  Laterality: N/A;   ESOPHAGOGASTRODUODENOSCOPY N/A 02/15/2022   Procedure: ESOPHAGOGASTRODUODENOSCOPY (EGD);  Surgeon: Toney Reil, MD;  Location: [facility] ENDOSCOPY;  Service: Gastroenterology;  Laterality: N/A;   ESOPHAGOGASTRODUODENOSCOPY (EGD) WITH PROPOFOL N/A 09/22/2016   Procedure: ESOPHAGOGASTRODUODENOSCOPY (EGD) WITH PROPOFOL;  Surgeon: Scot Jun, MD;  Location: [facility] ENDOSCOPY;  Service: Endoscopy;  Laterality: N/A;   ESOPHAGOGASTRODUODENOSCOPY (EGD) WITH PROPOFOL N/A 12/08/2016   Procedure: ESOPHAGOGASTRODUODENOSCOPY (EGD) WITH PROPOFOL;  Surgeon: Scot Jun, MD;  Location: [facility] ENDOSCOPY;  Service: Endoscopy;  Laterality: N/A;   FRACTURE SURGERY     GIVENS CAPSULE STUDY N/A 04/19/2022   Procedure: GIVENS CAPSULE STUDY;  Surgeon: Toney Reil, MD;  Location: [facility] ENDOSCOPY;  Service: Gastroenterology;  Laterality: N/A;   INSERTION HYBRID ANTERIOVENOUS GORTEX GRAFT  06/18/2022   Procedure: INSERTION HYBRID ANTERIOVENOUS GORTEX GRAFT;  Surgeon: Renford Dills, MD;  Location: [facility] ORS;  Service: Vascular;;   KNEE ARTHROSCOPY Left 02/09/2003   LOWER EXTREMITY ANGIOGRAPHY Left 08/12/2021   Procedure: Lower Extremity Angiography;  Surgeon: Renford Dills, MD;  Location: [facility] INVASIVE CV LAB;  Service:  Cardiovascular;  Laterality: Left;   TEE WITHOUT CARDIOVERSION N/A 01/31/2015   Procedure: TRANSESOPHAGEAL ECHOCARDIOGRAM (TEE);  Surgeon: Lewayne Bunting, MD;  Location: [facility] ENDOSCOPY;  Service: Cardiovascular;  Laterality: N/A;   TEMPORARY DIALYSIS CATHETER N/A 08/14/2021   Procedure: TEMPORARY DIALYSIS CATHETER;  Surgeon: Renford Dills, MD;  Location: [facility] INVASIVE CV LAB;  Service: Cardiovascular;  Laterality: N/A;   TUBAL LIGATION  02/09/1976   ULNAR NERVE TRANSPOSITION  02/08/2006    Family History  Problem Relation Age of Onset   Heart disease Mother        died from CHF   Asthma Mother    Diabetes Mother    Heart disease Father    Aneurysm Father    COPD Brother    Diabetes Brother    Alcohol abuse Paternal Aunt    Cancer Maternal Grandmother        unknownorigin   Anemia Neg Hx    Arrhythmia Neg Hx    Clotting disorder Neg Hx    Fainting Neg Hx    Heart attack Neg Hx    Heart failure Neg Hx    Hyperlipidemia Neg Hx    Hypertension Neg Hx     Allergies  Allergen Reactions   Codeine Nausea And Vomiting   Ivp Dye [Iodinated Contrast Media]     Patients states the IV Dye shuts her kidneys down       Latest Ref Rng & Units 06/18/2022    7:14 AM 06/07/2022    9:49 AM 05/10/2022    9:23 AM  CBC  WBC 4.0 - 10.5 K/uL  12.2  11.8   Hemoglobin 12.0 - 15.0 g/dL 01.0  27.2  53.6   Hematocrit 36.0 - 46.0 % 31.0  33.6  36.1   Platelets 150 - 400 K/uL  541  625       CMP     Component Value Date/Time   NA 140 07/27/2022 1055   NA 140 12/04/2021 1542   K 3.8 07/27/2022 1055   CL 108 07/27/2022 1055  CO2 22 07/27/2022 1055   GLUCOSE 126 (H) 07/27/2022 1055   BUN 47 (H) 07/27/2022 1055   BUN 38 (H) 12/04/2021 1542   CREATININE 3.11 (H) 07/27/2022 1055   CREATININE 1.37 (H) 03/21/2015 1451   CALCIUM 7.1 (L) 07/27/2022 1055   PROT 6.4 (L) 12/15/2021 1528   PROT 6.0 12/04/2021 1542   ALBUMIN 3.1 (L) 12/15/2021 1528   ALBUMIN 3.6 (L) 12/04/2021 1542   AST 13  (L) 12/15/2021 1528   ALT 10 12/15/2021 1528   ALKPHOS 81 12/15/2021 1528   BILITOT 0.4 12/15/2021 1528   BILITOT <0.2 12/04/2021 1542   EGFR 20 (L) 12/04/2021 1542   GFRNONAA 16 (L) 07/27/2022 1055   GFRNONAA 13 04/22/2022 1233     No results found.     Assessment & Plan:   1. CKD (chronic kidney disease), stage IV (HCC) The patient's recent graft placement has essentially healed.  She is currently not on dialysis however based on her noninvasive studies last week she can begin to utilize her access if necessary.  2. Essential hypertension Continue antihypertensive medications as already ordered, these medications have been reviewed and there are no changes at this time.  3. Leg swelling We discussed the importance of compression and controlling lower extremity edema.  The legs today are much improved since her last visit.  The patient will be try to obtain some compression socks.  She had Unna boots placed today however these can be removed by the patient when she has obtained compression socks.  She should also elevate her lower extremities when possible.  Will evaluate her swelling at her upcoming visit with her ABIs in 3 months  Current Outpatient Medications on File Prior to Visit  Medication Sig Dispense Refill   albuterol (VENTOLIN HFA) 108 (90 Base) MCG/ACT inhaler TAKE 2 PUFFS BY MOUTH EVERY 6 HOURS AS NEEDED FOR WHEEZE OR SHORTNESS OF BREATH (Patient taking differently: Inhale 2 puffs into the lungs every 6 (six) hours as needed for wheezing or shortness of breath.) 8.5 each 2   apixaban (ELIQUIS) 5 MG TABS tablet Take 1 tablet (5 mg total) by mouth 2 (two) times daily. 180 tablet 2   atorvastatin (LIPITOR) 40 MG tablet Take 1 tablet (40 mg total) by mouth daily. 90 tablet 3   buPROPion (WELLBUTRIN SR) 150 MG 12 hr tablet Take 1 tablet (150 mg total) by mouth 2 (two) times daily. 180 tablet 1   calcitRIOL (ROCALTROL) 0.25 MCG capsule Take 0.25 mcg by mouth daily.      Dulaglutide (TRULICITY) 4.5 MG/0.5ML SOPN Inject 4.5 mg into the skin every 7 (seven) days. (Patient taking differently: Inject 4.5 mg into the skin every 7 (seven) days. Mondays) 6 mL 3   fluticasone (FLONASE) 50 MCG/ACT nasal spray Place 2 sprays into both nostrils daily. (Patient taking differently: Place 2 sprays into both nostrils as needed.) 16 g 6   gabapentin (NEURONTIN) 300 MG capsule Take 300 mg by mouth daily.     glucose blood test strip OneTouch Verio strips     insulin aspart (NOVOLOG) 100 UNIT/ML injection Insulin pump     Insulin Disposable Pump (OMNIPOD DASH PODS, GEN 4,) MISC USE 1 POD EACH EVERY 72    HOURS     Insulin Human (INSULIN PUMP) SOLN Per endocrinoloy     loratadine (CLARITIN) 10 MG tablet Take 1 tablet by mouth daily.     meclizine (ANTIVERT) 12.5 MG tablet Take 1 tablet (12.5 mg total) by mouth 3 (  three) times daily as needed for dizziness. 30 tablet 0   OXYGEN Place 2 L into the nose continuous.     PARoxetine (PAXIL) 40 MG tablet Take 1 tablet (40 mg total) by mouth daily. 90 tablet 1   Spacer/Aero-Holding Chambers (OPTICHAMBER DIAMOND) MISC Use with inhaler to ensure medication is delivered throughout lungs 1 each 0   torsemide (DEMADEX) 20 MG tablet Take 1 tablet (20 mg) by mouth once daily 270 tablet 1   traMADol (ULTRAM) 50 MG tablet Take 1 tablet (50 mg total) by mouth every 12 (twelve) hours as needed for severe pain. 60 tablet 2   Vitamin D, Ergocalciferol, (DRISDOL) 1.25 MG (50000 UNIT) CAPS capsule Take 50,000 Units by mouth every 7 (seven) days. Sunday     No current facility-administered medications on file prior to visit.    There are no Patient Instructions on file for this visit. No follow-ups on file.   Georgiana Spinner, NP

## 2022-08-05 ENCOUNTER — Other Ambulatory Visit: Payer: Self-pay

## 2022-08-05 ENCOUNTER — Telehealth: Payer: Self-pay

## 2022-08-05 DIAGNOSIS — I5022 Chronic systolic (congestive) heart failure: Secondary | ICD-10-CM | POA: Diagnosis not present

## 2022-08-05 DIAGNOSIS — E1142 Type 2 diabetes mellitus with diabetic polyneuropathy: Secondary | ICD-10-CM | POA: Diagnosis not present

## 2022-08-05 DIAGNOSIS — I132 Hypertensive heart and chronic kidney disease with heart failure and with stage 5 chronic kidney disease, or end stage renal disease: Secondary | ICD-10-CM | POA: Diagnosis not present

## 2022-08-05 DIAGNOSIS — T8131XD Disruption of external operation (surgical) wound, not elsewhere classified, subsequent encounter: Secondary | ICD-10-CM | POA: Diagnosis not present

## 2022-08-05 DIAGNOSIS — E1151 Type 2 diabetes mellitus with diabetic peripheral angiopathy without gangrene: Secondary | ICD-10-CM | POA: Diagnosis not present

## 2022-08-05 DIAGNOSIS — J449 Chronic obstructive pulmonary disease, unspecified: Secondary | ICD-10-CM | POA: Diagnosis not present

## 2022-08-05 NOTE — Telephone Encounter (Signed)
Fax received from [facility] with medication updates as follows. Carvedilol 3.125mg  BID. Medication list updated to reflect changes.  FYI message sent to [facility]

## 2022-08-09 ENCOUNTER — Inpatient Hospital Stay: Payer: Medicare Other | Attending: Oncology

## 2022-08-09 ENCOUNTER — Other Ambulatory Visit: Payer: Self-pay | Admitting: *Deleted

## 2022-08-09 ENCOUNTER — Encounter: Payer: Self-pay | Admitting: Pulmonary Disease

## 2022-08-09 ENCOUNTER — Telehealth: Payer: Self-pay | Admitting: *Deleted

## 2022-08-09 ENCOUNTER — Telehealth: Payer: Self-pay | Admitting: Licensed Clinical Social Worker

## 2022-08-09 DIAGNOSIS — D631 Anemia in chronic kidney disease: Secondary | ICD-10-CM

## 2022-08-09 DIAGNOSIS — Z79899 Other long term (current) drug therapy: Secondary | ICD-10-CM | POA: Insufficient documentation

## 2022-08-09 DIAGNOSIS — E1140 Type 2 diabetes mellitus with diabetic neuropathy, unspecified: Secondary | ICD-10-CM | POA: Diagnosis not present

## 2022-08-09 DIAGNOSIS — Z794 Long term (current) use of insulin: Secondary | ICD-10-CM | POA: Diagnosis not present

## 2022-08-09 DIAGNOSIS — D509 Iron deficiency anemia, unspecified: Secondary | ICD-10-CM | POA: Insufficient documentation

## 2022-08-09 DIAGNOSIS — D72829 Elevated white blood cell count, unspecified: Secondary | ICD-10-CM | POA: Insufficient documentation

## 2022-08-09 DIAGNOSIS — B351 Tinea unguium: Secondary | ICD-10-CM | POA: Diagnosis not present

## 2022-08-09 DIAGNOSIS — D75839 Thrombocytosis, unspecified: Secondary | ICD-10-CM

## 2022-08-09 DIAGNOSIS — L600 Ingrowing nail: Secondary | ICD-10-CM | POA: Diagnosis not present

## 2022-08-09 LAB — CBC
HCT: 18.1 % — ABNORMAL LOW (ref 36.0–46.0)
Hemoglobin: 5.1 g/dL — CL (ref 12.0–15.0)
MCH: 23.7 pg — ABNORMAL LOW (ref 26.0–34.0)
MCHC: 28.2 g/dL — ABNORMAL LOW (ref 30.0–36.0)
MCV: 84.2 fL (ref 80.0–100.0)
Platelets: 497 10*3/uL — ABNORMAL HIGH (ref 150–400)
RBC: 2.15 MIL/uL — ABNORMAL LOW (ref 3.87–5.11)
RDW: 18.9 % — ABNORMAL HIGH (ref 11.5–15.5)
WBC: 9.7 10*3/uL (ref 4.0–10.5)
nRBC: 0 % (ref 0.0–0.2)

## 2022-08-09 LAB — IRON AND TIBC
Iron: 17 ug/dL — ABNORMAL LOW (ref 28–170)
Saturation Ratios: 5 % — ABNORMAL LOW (ref 10.4–31.8)
TIBC: 351 ug/dL (ref 250–450)
UIBC: 334 ug/dL

## 2022-08-09 LAB — FERRITIN: Ferritin: 11 ng/mL (ref 11–307)

## 2022-08-09 NOTE — Telephone Encounter (Signed)
I did not hear from pt . So I called again and I got her on the phone and she is agreeable for pt. To come 9:45 7/2 for type and screen and then  wed 7/3 9:45 for 2 units of blood. Pt ok with this.

## 2022-08-09 NOTE — Addendum Note (Signed)
Addended by: Allayne Butcher on: 08/09/2022 01:25 PM   Modules accepted: Level of Service

## 2022-08-09 NOTE — Telephone Encounter (Signed)
Patient was told she has 3 no show fees and the reasoning. Patient stated she will no longer see Dr. Silverio Lay because of no show fees.

## 2022-08-09 NOTE — Telephone Encounter (Signed)
Called pt and got her voicemail and told her hgb 5.1 and would like her to come back to lab today to get type and cross and then 7/2 to get 2 units  of blood. Left her my direct number to call me

## 2022-08-09 NOTE — Progress Notes (Signed)
lab

## 2022-08-10 ENCOUNTER — Other Ambulatory Visit: Payer: Self-pay | Admitting: Oncology

## 2022-08-10 ENCOUNTER — Inpatient Hospital Stay: Payer: Medicare Other

## 2022-08-10 ENCOUNTER — Other Ambulatory Visit: Payer: Self-pay | Admitting: *Deleted

## 2022-08-10 ENCOUNTER — Ambulatory Visit: Payer: Medicare Other | Admitting: Licensed Clinical Social Worker

## 2022-08-10 DIAGNOSIS — N184 Chronic kidney disease, stage 4 (severe): Secondary | ICD-10-CM

## 2022-08-10 DIAGNOSIS — D500 Iron deficiency anemia secondary to blood loss (chronic): Secondary | ICD-10-CM

## 2022-08-10 DIAGNOSIS — Z79899 Other long term (current) drug therapy: Secondary | ICD-10-CM | POA: Diagnosis not present

## 2022-08-10 DIAGNOSIS — D72829 Elevated white blood cell count, unspecified: Secondary | ICD-10-CM | POA: Diagnosis not present

## 2022-08-10 DIAGNOSIS — D509 Iron deficiency anemia, unspecified: Secondary | ICD-10-CM | POA: Diagnosis not present

## 2022-08-10 LAB — TYPE AND SCREEN

## 2022-08-10 LAB — PREPARE RBC (CROSSMATCH)

## 2022-08-10 LAB — BPAM RBC

## 2022-08-11 ENCOUNTER — Inpatient Hospital Stay: Payer: Medicare Other

## 2022-08-11 ENCOUNTER — Telehealth: Payer: Self-pay | Admitting: *Deleted

## 2022-08-11 DIAGNOSIS — Z79899 Other long term (current) drug therapy: Secondary | ICD-10-CM | POA: Diagnosis not present

## 2022-08-11 DIAGNOSIS — D72829 Elevated white blood cell count, unspecified: Secondary | ICD-10-CM | POA: Diagnosis not present

## 2022-08-11 DIAGNOSIS — D509 Iron deficiency anemia, unspecified: Secondary | ICD-10-CM | POA: Diagnosis not present

## 2022-08-11 DIAGNOSIS — D500 Iron deficiency anemia secondary to blood loss (chronic): Secondary | ICD-10-CM

## 2022-08-11 LAB — BPAM RBC: Blood Product Expiration Date: 202407282359

## 2022-08-11 LAB — TYPE AND SCREEN
Unit division: 0
Unit division: 0

## 2022-08-11 MED ORDER — SODIUM CHLORIDE 0.9% IV SOLUTION
250.0000 mL | Freq: Once | INTRAVENOUS | Status: AC
  Administered 2022-08-11: 250 mL via INTRAVENOUS
  Filled 2022-08-11: qty 250

## 2022-08-11 MED ORDER — ACETAMINOPHEN 325 MG PO TABS
650.0000 mg | ORAL_TABLET | Freq: Once | ORAL | Status: AC
  Administered 2022-08-11: 650 mg via ORAL
  Filled 2022-08-11: qty 2

## 2022-08-11 MED ORDER — DIPHENHYDRAMINE HCL 50 MG/ML IJ SOLN
25.0000 mg | Freq: Once | INTRAMUSCULAR | Status: AC
  Administered 2022-08-11: 25 mg via INTRAVENOUS
  Filled 2022-08-11: qty 1

## 2022-08-11 NOTE — Telephone Encounter (Signed)
Doni sent me a message to me that husband was concerned about when her next appt since she has such a drop with hgb, I called pt and got voicemail and left message that I will ask Smith Robert on 7/5 and she will give [place] a date to come back for lab check and assume it will be next week. I will let them know on 7/5 when to come back

## 2022-08-12 LAB — BPAM RBC
Blood Product Expiration Date: 202407252359
ISSUE DATE / TIME: 202407031040
ISSUE DATE / TIME: 202407031241
Unit Type and Rh: 6200
Unit Type and Rh: 6200

## 2022-08-12 LAB — TYPE AND SCREEN
ABO/RH(D): A POS
Antibody Screen: NEGATIVE

## 2022-08-13 ENCOUNTER — Other Ambulatory Visit: Payer: Self-pay | Admitting: *Deleted

## 2022-08-13 DIAGNOSIS — D508 Other iron deficiency anemias: Secondary | ICD-10-CM

## 2022-08-13 DIAGNOSIS — D509 Iron deficiency anemia, unspecified: Secondary | ICD-10-CM

## 2022-08-16 DIAGNOSIS — E1122 Type 2 diabetes mellitus with diabetic chronic kidney disease: Secondary | ICD-10-CM | POA: Diagnosis not present

## 2022-08-16 DIAGNOSIS — I10 Essential (primary) hypertension: Secondary | ICD-10-CM | POA: Diagnosis not present

## 2022-08-16 DIAGNOSIS — N184 Chronic kidney disease, stage 4 (severe): Secondary | ICD-10-CM | POA: Diagnosis not present

## 2022-08-16 DIAGNOSIS — R600 Localized edema: Secondary | ICD-10-CM | POA: Diagnosis not present

## 2022-08-16 DIAGNOSIS — N2581 Secondary hyperparathyroidism of renal origin: Secondary | ICD-10-CM | POA: Diagnosis not present

## 2022-08-16 LAB — LAB REPORT - SCANNED

## 2022-08-18 ENCOUNTER — Other Ambulatory Visit: Payer: Self-pay | Admitting: Oncology

## 2022-08-19 ENCOUNTER — Inpatient Hospital Stay (HOSPITAL_BASED_OUTPATIENT_CLINIC_OR_DEPARTMENT_OTHER): Payer: Medicare Other | Admitting: Oncology

## 2022-08-19 ENCOUNTER — Inpatient Hospital Stay: Payer: Medicare Other

## 2022-08-19 ENCOUNTER — Telehealth: Payer: Self-pay

## 2022-08-19 ENCOUNTER — Encounter: Payer: Self-pay | Admitting: Oncology

## 2022-08-19 VITALS — BP 146/60 | HR 80 | Temp 96.5°F | Resp 20

## 2022-08-19 VITALS — BP 139/63 | HR 78 | Temp 96.3°F | Resp 19 | Wt 219.2 lb

## 2022-08-19 DIAGNOSIS — I5032 Chronic diastolic (congestive) heart failure: Secondary | ICD-10-CM

## 2022-08-19 DIAGNOSIS — D509 Iron deficiency anemia, unspecified: Secondary | ICD-10-CM

## 2022-08-19 DIAGNOSIS — D72829 Elevated white blood cell count, unspecified: Secondary | ICD-10-CM | POA: Diagnosis not present

## 2022-08-19 DIAGNOSIS — D508 Other iron deficiency anemias: Secondary | ICD-10-CM

## 2022-08-19 DIAGNOSIS — D500 Iron deficiency anemia secondary to blood loss (chronic): Secondary | ICD-10-CM

## 2022-08-19 DIAGNOSIS — Z79899 Other long term (current) drug therapy: Secondary | ICD-10-CM | POA: Diagnosis not present

## 2022-08-19 LAB — JAK2 V617F RFX CALR/MPL/E12-15: JAK2 V617F %: 24.25 %

## 2022-08-19 LAB — CBC
HCT: 25 % — ABNORMAL LOW (ref 36.0–46.0)
Hemoglobin: 7.1 g/dL — ABNORMAL LOW (ref 12.0–15.0)
MCH: 24.1 pg — ABNORMAL LOW (ref 26.0–34.0)
MCHC: 28.4 g/dL — ABNORMAL LOW (ref 30.0–36.0)
MCV: 84.7 fL (ref 80.0–100.0)
Platelets: 374 10*3/uL (ref 150–400)
RBC: 2.95 MIL/uL — ABNORMAL LOW (ref 3.87–5.11)
RDW: 19.8 % — ABNORMAL HIGH (ref 11.5–15.5)
WBC: 9.7 10*3/uL (ref 4.0–10.5)
nRBC: 0 % (ref 0.0–0.2)

## 2022-08-19 MED ORDER — SODIUM CHLORIDE 0.9 % IV SOLN
Freq: Once | INTRAVENOUS | Status: AC
  Filled 2022-08-19: qty 250

## 2022-08-19 MED ORDER — CARVEDILOL 6.25 MG PO TABS
6.2500 mg | ORAL_TABLET | Freq: Two times a day (BID) | ORAL | 3 refills | Status: DC
Start: 2022-08-19 — End: 2022-10-01

## 2022-08-19 MED ORDER — SODIUM CHLORIDE 0.9 % IV SOLN
1000.0000 mg | Freq: Once | INTRAVENOUS | Status: AC
  Administered 2022-08-19: 1000 mg via INTRAVENOUS
  Filled 2022-08-19: qty 10

## 2022-08-19 NOTE — Telephone Encounter (Signed)
[facility] faxed document with an update to medication. Per [facility] increase Coreg to 6.25mg  BID. Will send to Clarisa Kindred, FNP as an [place]ain Childes. Medication list has been updated to reflect medication changes.

## 2022-08-19 NOTE — Progress Notes (Deleted)
PCP: Merita Norton, FNP (last seen 12/23) Primary Cardiologist: Debbe Odea, MD (last seen 05/24)  HPI:  Jocelyn Wells is a 67 y/o female with a history of DM, HTN, CKD, stroke, anemia, anxiety, COPD, depression, leukocytosis, tobacco use and chronic heart failure.   Was in the ED 02/26/22 due to abnormal labs. Admitted 01/01/22 due to fatigue and dyspnea, also increasing bilateral lower extremity swelling. On presentation she was requiring 2 L of oxygen to maintain oxygen saturation.  Chest x-ray showed cardiomegaly, mild vascular congestion, bibasilar edema. Needed blood transfusion due to anemia. Initially given IV lasix. Transitioned to oral diuretics. Wound consult done. Discharged after 3 days. Was in the ED 12/07/21 due to abnormal labs from PCP office but LWBS.   Echo 09/29/21: EF of 45-50% along with moderate LVH & mild LAE.  Echo 02/24/22: EF 60-65%.   RHC/LHC 03/25/15: Severe left ventricular systolic dysfunction with EF 25-30% Elevated pulmonary Order wedge pressure and upper normal to mildly elevated LVEDP  Normal coronary arteries.  Severe systolic left ventricular dysfunction with EF 25-30%. Mildly elevated LVEDP and moderate pulmonary capillary wedge elevation.  She presents today for a HF follow-up visit with a chief complaint of   Supposed to be wearing oxygen around the clock but she currently only wears it at bedtime as she's waiting on a portable oxygen tank.  Had had graft placed in her left arm 06/18/22 for upcoming dialysis although patient doesn't have good insight into this. She is hopeful that she doesn't have to do dialysis even though her sons have tried to prepare her for this outcome. She is unsure of her daily fluid intake.   Since last here she took 2 days of increased diuretic (40mg ) and is now back to taking 20mg  daily.   ROS: All systems negative except as listed in HPI, PMH and Problem List.  SH:  Social History   Socioeconomic History   Marital  status: Married    Spouse name: Jocelyn Wells   Number of children: 2   Years of education: Not on file   Highest education level: Bachelor's degree (e.g., BA, AB, BS)  Occupational History   Occupation: disabled    Comment: retired  Tobacco Use   Smoking status: Former    Current packs/day: 0.00    Average packs/day: 0.5 packs/day for 48.3 years (24.2 ttl pk-yrs)    Types: Cigarettes    Start date: 02/08/1974    Quit date: 05/31/2022    Years since quitting: 0.2   Smokeless tobacco: Never  Vaping Use   Vaping status: Never Used  Substance and Sexual Activity   Alcohol use: No    Alcohol/week: 0.0 standard drinks of alcohol   Drug use: No   Sexual activity: Yes    Birth control/protection: None  Other Topics Concern   Not on file  Social History Narrative   Lives at home with stepson and husband, dogs and cats   Caffeine  Drinks sweet tea.   Right handed.    Social Determinants of Health   Financial Resource Strain: Low Risk  (12/11/2021)   Overall Financial Resource Strain (CARDIA)    Difficulty of Paying Living Expenses: Not very hard  Food Insecurity: No Food Insecurity (01/05/2022)   Hunger Vital Sign    Worried About Running Out of Food in the Last Year: Never true    Ran Out of Food in the Last Year: Never true  Transportation Needs: No Transportation Needs (01/05/2022)   PRAPARE - Transportation  Lack of Transportation (Medical): No    Lack of Transportation (Non-Medical): No  Physical Activity: Inactive (06/01/2022)   Exercise Vital Sign    Days of Exercise per Week: 0 days    Minutes of Exercise per Session: 0 min  Stress: No Stress Concern Present (06/01/2022)   [employer] of Occupational Health - Occupational Stress Questionnaire    Feeling of Stress : Only a little  Social Connections: Moderately Isolated (12/11/2021)   Social Connection and Isolation Panel [NHANES]    Frequency of Communication with Friends and Family: More than three times a week     Frequency of Social Gatherings with Friends and Family: Once a week    Attends Religious Services: Never    Database administrator or Organizations: No    Attends Banker Meetings: Never    Marital Status: Married  Catering manager Violence: Not At Risk (01/02/2022)   Humiliation, Afraid, Rape, and Kick questionnaire    Fear of Current or Ex-Partner: No    Emotionally Abused: No    Physically Abused: No    Sexually Abused: No    FH:  Family History  Problem Relation Age of Onset   Heart disease Mother        died from CHF   Asthma Mother    Diabetes Mother    Heart disease Father    Aneurysm Father    COPD Brother    Diabetes Brother    Alcohol abuse Paternal Aunt    Cancer Maternal Grandmother        unknownorigin   Anemia Neg Hx    Arrhythmia Neg Hx    Clotting disorder Neg Hx    Fainting Neg Hx    Heart attack Neg Hx    Heart failure Neg Hx    Hyperlipidemia Neg Hx    Hypertension Neg Hx     Past Medical History:  Diagnosis Date   Acute ischemic left MCA stroke (HCC) 01/29/2015   a.) MRI brain 01/29/2015: acute left MCA territory infarcts involving the posterior left frontal lobe and left insular cortex   Anemia of chronic renal failure    Anxiety    Arthritis    Complication of anesthesia    a.) PONV; b.) awareness under anesthesia   COPD (chronic obstructive pulmonary disease) (HCC)    Depression    Dyspnea    ESRD (end stage renal disease) (HCC)    Headache    HFrEF (heart failure with reduced ejection fraction) (HCC)    a.) TTE 01/30/2015: EF 30-35%, diff HK, LAE, G2DD; b.) TTE 06/13/2015: EF 40-45%, diff HK, G1DD; c.) TTE 10/06/2017: EF 60-65% MAC, no IAS; d.) TTE 09/29/2021: EF 45-50%;, LVH, LAE, AoV sclerosis, G1DD; e.) TTE 02/24/2022: EF 60-65%, no IAS   HTN (hypertension)    Leukocytosis    Long term current use of anticoagulant    a.) apixaban   Long term current use of antithrombotics/antiplatelets    a.) clopidogrel   NICM  (nonischemic cardiomyopathy) (HCC)    a.) TTE 01/30/2015: EF 30-35%; b.) R/LHC 03/25/2015: EF 25-30%, mRA 13, mPA 30, mPCWP 26, LVEDP 17, CO 3.66, CI 1.8; c.) TTE 06/13/2015: EF 40-45%; d.) TTE 10/06/2017: EF 60-65%; e.) TTE 09/29/2021: EF 45-50%; f.) TTE 02/24/2022: EF 60-65%   Obesity    On supplemental oxygen by nasal cannula    a.) 2L/[place] at night   Osteoporosis    Peripheral vascular disease (HCC)    Type 2 diabetes mellitus treated with  insulin (HCC)     Current Outpatient Medications  Medication Sig Dispense Refill   albuterol (VENTOLIN HFA) 108 (90 Base) MCG/ACT inhaler TAKE 2 PUFFS BY MOUTH EVERY 6 HOURS AS NEEDED FOR WHEEZE OR SHORTNESS OF BREATH (Patient taking differently: Inhale 2 puffs into the lungs every 6 (six) hours as needed for wheezing or shortness of breath.) 8.5 each 2   apixaban (ELIQUIS) 5 MG TABS tablet Take 1 tablet (5 mg total) by mouth 2 (two) times daily. 180 tablet 2   atorvastatin (LIPITOR) 40 MG tablet Take 1 tablet (40 mg total) by mouth daily. 90 tablet 3   buPROPion (WELLBUTRIN SR) 150 MG 12 hr tablet Take 1 tablet (150 mg total) by mouth 2 (two) times daily. 180 tablet 1   calcitRIOL (ROCALTROL) 0.25 MCG capsule Take 0.25 mcg by mouth daily.     carvedilol (COREG) 6.25 MG tablet Take 1 tablet (6.25 mg total) by mouth 2 (two) times daily. 180 tablet 3   Dulaglutide (TRULICITY) 4.5 MG/0.5ML SOPN Inject 4.5 mg into the skin every 7 (seven) days. (Patient taking differently: Inject 4.5 mg into the skin every 7 (seven) days. Mondays) 6 mL 3   fluticasone (FLONASE) 50 MCG/ACT nasal spray Place 2 sprays into both nostrils daily. (Patient taking differently: Place 2 sprays into both nostrils as needed.) 16 g 6   gabapentin (NEURONTIN) 300 MG capsule Take 300 mg by mouth daily.     glucose blood test strip OneTouch Verio strips     insulin aspart (NOVOLOG) 100 UNIT/ML injection Insulin pump     Insulin Disposable Pump (OMNIPOD DASH PODS, GEN 4,) MISC USE 1 POD EACH  EVERY 72    HOURS     Insulin Human (INSULIN PUMP) SOLN Per endocrinoloy     loratadine (CLARITIN) 10 MG tablet Take 1 tablet by mouth daily.     meclizine (ANTIVERT) 12.5 MG tablet Take 1 tablet (12.5 mg total) by mouth 3 (three) times daily as needed for dizziness. 30 tablet 0   OXYGEN Place 2 L into the nose continuous.     PARoxetine (PAXIL) 40 MG tablet Take 1 tablet (40 mg total) by mouth daily. 90 tablet 1   Spacer/Aero-Holding Chambers (OPTICHAMBER DIAMOND) MISC Use with inhaler to ensure medication is delivered throughout lungs 1 each 0   torsemide (DEMADEX) 20 MG tablet Take 1 tablet (20 mg) by mouth once daily 270 tablet 1   traMADol (ULTRAM) 50 MG tablet Take 1 tablet (50 mg total) by mouth every 12 (twelve) hours as needed for severe pain. 60 tablet 2   Vitamin D, Ergocalciferol, (DRISDOL) 1.25 MG (50000 UNIT) CAPS capsule Take 50,000 Units by mouth every 7 (seven) days. Sunday     No current facility-administered medications for this visit.     PHYSICAL EXAM:  General:  Well appearing. No resp difficulty HEENT: normal Neck: supple. JVP flat. No lymphadenopathy or thryomegaly appreciated. Cor: PMI normal. Regular rate & rhythm. No rubs, gallops or murmurs. Lungs: few rales in LUL Abdomen: soft, nontender, nondistended. No hepatosplenomegaly. No bruits or masses. Good bowel sounds. Extremities: no cyanosis, clubbing, rash, both lower legs wrapped in unna boots, left arm with bandage where graft for dialysis is at Neuro: alert & oriented x3, cranial nerves grossly intact. Moves all 4 extremities w/o difficulty. Affect pleasant.   ECG: 06/07/22 was NSR  ReDs:    ASSESSMENT & PLAN:  1: NICM with preserved ejection fraction- - suspect due to CKD as cath in 2017 showed  normal coronary arteries - NYHA class III - mildly fluid overloaded today with cough, weight gain and elevated ReDs reading - weighing daily; reminded to call for an overnight weight gain of > 2 pounds or a  weekly weight gain of > 5 pounds - weight up 10 pounds from last visit here 3 months ago - ReDs               ; 2 weeks ago it was 40% - Echo 09/29/21: EF of 45-50% along with moderate LVH & mild LAE - echo 02/24/22: EF 60-65%. .  - RHC/LHC 03/25/15: Severe left ventricular systolic dysfunction with EF 25-30%. Elevated pulmonary Order wedge pressure and upper normal to mildly elevated LVEDP. Normal coronary arteries. Severe systolic left ventricular dysfunction with EF 25-30%. Mildly elevated LVEDP and moderate pulmonary capillary wedge elevation. - not adding salt to her food - emphasized knowing how much fluids she is taking in; measure her cup so she knows how many ounces in the cup; keep daily fluid intake to 40 ounces unless told otherwise by nephrology - continue torsemide 20mg  daily  - due to renal function, not a candidate for spironolactone  - consider resuming low dose beta-blocker if BP remains good - emphasized using her walker whenever she is walking - saw cardiology (Agbor-Etang) 05/24 - saw [facility] provider (Bensimhon) 12/23 - has lower legs wrapped in UNNA boots and being changed by home health nurse - BNP 01/26/22 was 2195.7 - participating in [facility]   2: HTN with CKD- - BP  - saw PCP Suzie Portela) 12/23 - BMP 07/27/22 reviewed and showed sodium 140, potassium 3.8, creatinine 3.11 & GFR 16 - saw nephrology Thedore Mins) 07/24  3: COPD- - saw pulmonology Jayme Cloud) 12/23 - wears oxygen at 2L at bedtime - waiting on portable tank so that she can wear oxygen 24/7  4: Anemia- - went to cancer center 08/19/22 - hemoglobin 08/19/22 was 7.1 - received iron infusion 08/19/22  5: DM- - saw endocrinology (Solum) 06/24 - A1c 04/22/22 was 7.8; previously was 10.1%  6: PAD- - saw vascular Manson Passey) 06/24 - had graft in left arm placed 06/18/22

## 2022-08-20 ENCOUNTER — Encounter: Payer: Medicare Other | Admitting: Family

## 2022-08-20 ENCOUNTER — Telehealth: Payer: Self-pay

## 2022-08-20 NOTE — Telephone Encounter (Signed)
Per Dr. Smith Robert no gi f/u scheduled yet. severe anemia. staff msg was sent. Per Dr. Myles Lipps this is a Dr. Allegra Lai patient would require follow-up.  Work in patient for 09/28/2022 2:00pm

## 2022-08-22 ENCOUNTER — Encounter: Payer: Self-pay | Admitting: Oncology

## 2022-08-22 NOTE — Progress Notes (Signed)
Hematology/Oncology Consult note [facility]  [phone] [phone]  Patient Care Team: Jacky Kindle, FNP as PCP - General (Family Medicine) Tedd Sias, Marlana Salvage, MD as Physician Assistant (Endocrinology) Duke Salvia, MD as Consulting Physician (Cardiology) Brandy Hale, MD (Inactive) as Referring Physician (Psychiatry) Deirdre Evener, MD as Consulting Physician (Dermatology) Dimmig, Maisie Fus, MD as Referring Physician (Orthopedic Surgery) Schnier, Latina Craver, MD (Vascular Surgery) Albin Felling, OD (Optometry) Gaspar Cola, [facility] (Inactive) (Pharmacist) Salena Saner, MD as Consulting Physician (Pulmonary Disease) Juanell Fairly, RN as Case Manager   Name of the patient: Jocelyn Wells  [id number]  1955-12-26   Date of visit: 08/22/22  Diagnosis- 1. Iron deficiency anemia 2. Thrombocytosis JAK2 +  Chief complaint/ Reason for visit- routine f/u of iron deficiency anemia  Heme/Onc history: patient is a 67 year old female with a past medical history significant for smoking, CKD, hypertension hyperlipidemia, COPD among other medical problems.She has been referred for leukocytosis.  Most recent CBC from 12/07/2021 showed white cell count of 13.9, H&H of 7.2/24.6 with an MCV of 80.9 and a platelet count of 535.  Differential is mainly shown neutrophilia with occasional monocytosis her white cell count at baseline up until July 2023 was around 11 and it dropped down to 8.2 in August 2023.  She has had some borderline microcytosis over the years.  No recent iron studies have been checked.  She has not seen GI recently.  Is any vaginal bleeding.  Denies any blood loss in her stool or urine.  Denies any dark melanotic stools.  Denies any consistent use of NSAIDs    Patient had an EGD and colonoscopy in January 2024 by Dr. Allegra Lai we did not show any evidence of bleeding.  Endoscopy revealed mild chronic gastritis that was negative for H.  pylori.She also had a capsule study which did not show any active bleeding  Interval history- she has baseline fatigue. Denies any black tarry stools or bright red blood per rectum  ECOG PS- 2 Pain scale- 0   Review of systems- Review of Systems  Constitutional:  Positive for malaise/fatigue. Negative for chills, fever and weight loss.  HENT:  Negative for congestion, ear discharge and nosebleeds.   Eyes:  Negative for blurred vision.  Respiratory:  Negative for cough, hemoptysis, sputum production, shortness of breath and wheezing.   Cardiovascular:  Negative for chest pain, palpitations, orthopnea and claudication.  Gastrointestinal:  Negative for abdominal pain, blood in stool, constipation, diarrhea, heartburn, melena, nausea and vomiting.  Genitourinary:  Negative for dysuria, flank pain, frequency, hematuria and urgency.  Musculoskeletal:  Negative for back pain, joint pain and myalgias.  Skin:  Negative for rash.  Neurological:  Negative for dizziness, tingling, focal weakness, seizures, weakness and headaches.  Endo/Heme/Allergies:  Does not bruise/bleed easily.  Psychiatric/Behavioral:  Negative for depression and suicidal ideas. The patient does not have insomnia.       Allergies  Allergen Reactions   Codeine Nausea And Vomiting   Ivp Dye [Iodinated Contrast Media]     Patients states the IV Dye shuts her kidneys down     Past Medical History:  Diagnosis Date   Acute ischemic left MCA stroke (HCC) 01/29/2015   a.) MRI brain 01/29/2015: acute left MCA territory infarcts involving the posterior left frontal lobe and left insular cortex   Anemia of chronic renal failure    Anxiety    Arthritis    Complication of anesthesia    a.)  PONV; b.) awareness under anesthesia   COPD (chronic obstructive pulmonary disease) (HCC)    Depression    Dyspnea    ESRD (end stage renal disease) (HCC)    Headache    HFrEF (heart failure with reduced ejection fraction) (HCC)    a.)  TTE 01/30/2015: EF 30-35%, diff HK, LAE, G2DD; b.) TTE 06/13/2015: EF 40-45%, diff HK, G1DD; c.) TTE 10/06/2017: EF 60-65% MAC, no IAS; d.) TTE 09/29/2021: EF 45-50%;, LVH, LAE, AoV sclerosis, G1DD; e.) TTE 02/24/2022: EF 60-65%, no IAS   HTN (hypertension)    Leukocytosis    Long term current use of anticoagulant    a.) apixaban   Long term current use of antithrombotics/antiplatelets    a.) clopidogrel   NICM (nonischemic cardiomyopathy) (HCC)    a.) TTE 01/30/2015: EF 30-35%; b.) R/LHC 03/25/2015: EF 25-30%, mRA 13, mPA 30, mPCWP 26, LVEDP 17, CO 3.66, CI 1.8; c.) TTE 06/13/2015: EF 40-45%; d.) TTE 10/06/2017: EF 60-65%; e.) TTE 09/29/2021: EF 45-50%; f.) TTE 02/24/2022: EF 60-65%   Obesity    On supplemental oxygen by nasal cannula    a.) 2L/[place] at night   Osteoporosis    Peripheral vascular disease (HCC)    Type 2 diabetes mellitus treated with insulin [facility])      Past Surgical History:  Procedure Laterality Date   ABDOMINAL HYSTERECTOMY     ANKLE FRACTURE SURGERY Left 02/09/2000   AV FISTULA PLACEMENT Left 06/18/2022   Procedure: ARTERIOVENOUS (AV) FISTULA CREATION (BRACHIAL AXILLA);  Surgeon: Renford Dills, MD;  Location: [facility] ORS;  Service: Vascular;  Laterality: Left;   BILATERAL SALPINGOOPHORECTOMY  02/08/1998   CARDIAC CATHETERIZATION N/A 03/25/2015   Procedure: Right/Left Heart Cath and Coronary Angiography;  Surgeon: Lyn Records, MD;  Location[facility] INVASIVE CV LAB;  Service: Cardiovascular;  Laterality: N/A;   CESAREAN SECTION  02/09/1976   COLONOSCOPY WITH PROPOFOL N/A 09/22/2016   Procedure: COLONOSCOPY WITH PROPOFOL;  Surgeon: Scot Jun, MD;  Location: [facility] ENDOSCOPY;  Service: Endoscopy;  Laterality: N/A;   COLONOSCOPY WITH PROPOFOL N/A 02/15/2022   Procedure: COLONOSCOPY WITH PROPOFOL;  Surgeon: Toney Reil, MD;  Location: [facility] ENDOSCOPY;  Service: Gastroenterology;  Laterality: N/A;   EP IMPLANTABLE DEVICE N/A 08/05/2015   Procedure: Loop Recorder  Insertion;  Surgeon: Duke Salvia, MD;  Location: [facility] INVASIVE CV LAB;  Service: Cardiovascular;  Laterality: N/A;   ESOPHAGOGASTRODUODENOSCOPY N/A 02/15/2022   Procedure: ESOPHAGOGASTRODUODENOSCOPY (EGD);  Surgeon: Toney Reil, MD;  Location: [facility] ENDOSCOPY;  Service: Gastroenterology;  Laterality: N/A;   ESOPHAGOGASTRODUODENOSCOPY (EGD) WITH PROPOFOL N/A 09/22/2016   Procedure: ESOPHAGOGASTRODUODENOSCOPY (EGD) WITH PROPOFOL;  Surgeon: Scot Jun, MD;  Location: [facility] ENDOSCOPY;  Service: Endoscopy;  Laterality: N/A;   ESOPHAGOGASTRODUODENOSCOPY (EGD) WITH PROPOFOL N/A 12/08/2016   Procedure: ESOPHAGOGASTRODUODENOSCOPY (EGD) WITH PROPOFOL;  Surgeon: Scot Jun, MD;  Location: [facility] ENDOSCOPY;  Service: Endoscopy;  Laterality: N/A;   FRACTURE SURGERY     GIVENS CAPSULE STUDY N/A 04/19/2022   Procedure: GIVENS CAPSULE STUDY;  Surgeon: Toney Reil, MD;  Location: [facility] ENDOSCOPY;  Service: Gastroenterology;  Laterality: N/A;   INSERTION HYBRID ANTERIOVENOUS GORTEX GRAFT  06/18/2022   Procedure: INSERTION HYBRID ANTERIOVENOUS GORTEX GRAFT;  Surgeon: Renford Dills, MD;  Location: [facility] ORS;  Service: Vascular;;   KNEE ARTHROSCOPY Left 02/09/2003   LOWER EXTREMITY ANGIOGRAPHY Left 08/12/2021   Procedure: Lower Extremity Angiography;  Surgeon: Renford Dills, MD;  Location: [facility] INVASIVE CV LAB;  Service: Cardiovascular;  Laterality: Left;  TEE WITHOUT CARDIOVERSION N/A 01/31/2015   Procedure: TRANSESOPHAGEAL ECHOCARDIOGRAM (TEE);  Surgeon: Lewayne Bunting, MD;  Location: [facility] ENDOSCOPY;  Service: Cardiovascular;  Laterality: N/A;   TEMPORARY DIALYSIS CATHETER N/A 08/14/2021   Procedure: TEMPORARY DIALYSIS CATHETER;  Surgeon: Renford Dills, MD;  Location: [facility] INVASIVE CV LAB;  Service: Cardiovascular;  Laterality: N/A;   TUBAL LIGATION  02/09/1976   ULNAR NERVE TRANSPOSITION  02/08/2006    Social History   Socioeconomic History   Marital status: Married     Spouse name: [name]   Number of children: 2   Years of education: Not on file   Highest education level: Bachelor's degree (e.g., BA, AB, BS)  Occupational History   Occupation: disabled    Comment: retired  Tobacco Use   Smoking status: Former    Current packs/day: 0.00    Average packs/day: 0.5 packs/day for 48.3 years (24.2 ttl pk-yrs)    Types: Cigarettes    Start date: 02/08/1974    Quit date: 05/31/2022    Years since quitting: 0.2   Smokeless tobacco: Never  Vaping Use   Vaping status: Never Used  Substance and Sexual Activity   Alcohol use: No    Alcohol/week: 0.0 standard drinks of alcohol   Drug use: No   Sexual activity: Yes    Birth control/protection: None  Other Topics Concern   Not on file  Social History Narrative   Lives at home with stepson and husband, dogs and cats   Caffeine  Drinks sweet tea.   Right handed.    Social Determinants of Health   Financial Resource Strain: Low Risk  (12/11/2021)   Overall Financial Resource Strain (CARDIA)    Difficulty of Paying Living Expenses: Not very hard  Food Insecurity: No Food Insecurity (01/05/2022)   Hunger Vital Sign    Worried About Running Out of Food in the Last Year: Never true    Ran Out of Food in the Last Year: Never true  Transportation Needs: No Transportation Needs (01/05/2022)   PRAPARE - Administrator, Civil Service (Medical): No    Lack of Transportation (Non-Medical): No  Physical Activity: Inactive (06/01/2022)   Exercise Vital Sign    Days of Exercise per Week: 0 days    Minutes of Exercise per Session: 0 min  Stress: No Stress Concern Present (06/01/2022)   [employer] of Occupational Health - Occupational Stress Questionnaire    Feeling of Stress : Only a little  Social Connections: Moderately Isolated (12/11/2021)   Social Connection and Isolation Panel [NHANES]    Frequency of Communication with Friends and Family: More than three times a week    Frequency of Social  Gatherings with Friends and Family: Once a week    Attends Religious Services: Never    Database administrator or Organizations: No    Attends Banker Meetings: Never    Marital Status: Married  Catering manager Violence: Not At Risk (01/02/2022)   Humiliation, Afraid, Rape, and Kick questionnaire    Fear of Current or Ex-Partner: No    Emotionally Abused: No    Physically Abused: No    Sexually Abused: No    Family History  Problem Relation Age of Onset   Heart disease Mother        died from CHF   Asthma Mother    Diabetes Mother    Heart disease Father    Aneurysm Father    COPD Brother    Diabetes  Brother    Alcohol abuse Paternal Aunt    Cancer Maternal Grandmother        unknownorigin   Anemia Neg Hx    Arrhythmia Neg Hx    Clotting disorder Neg Hx    Fainting Neg Hx    Heart attack Neg Hx    Heart failure Neg Hx    Hyperlipidemia Neg Hx    Hypertension Neg Hx      Current Outpatient Medications:    albuterol (VENTOLIN HFA) 108 (90 Base) MCG/ACT inhaler, TAKE 2 PUFFS BY MOUTH EVERY 6 HOURS AS NEEDED FOR WHEEZE OR SHORTNESS OF BREATH (Patient taking differently: Inhale 2 puffs into the lungs every 6 (six) hours as needed for wheezing or shortness of breath.), Disp: 8.5 each, Rfl: 2   apixaban (ELIQUIS) 5 MG TABS tablet, Take 1 tablet (5 mg total) by mouth 2 (two) times daily., Disp: 180 tablet, Rfl: 2   atorvastatin (LIPITOR) 40 MG tablet, Take 1 tablet (40 mg total) by mouth daily., Disp: 90 tablet, Rfl: 3   buPROPion (WELLBUTRIN SR) 150 MG 12 hr tablet, Take 1 tablet (150 mg total) by mouth 2 (two) times daily., Disp: 180 tablet, Rfl: 1   calcitRIOL (ROCALTROL) 0.25 MCG capsule, Take 0.25 mcg by mouth daily., Disp: , Rfl:    carvedilol (COREG) 6.25 MG tablet, Take 1 tablet (6.25 mg total) by mouth 2 (two) times daily., Disp: 180 tablet, Rfl: 3   Dulaglutide (TRULICITY) 4.5 MG/0.5ML SOPN, Inject 4.5 mg into the skin every 7 (seven) days. (Patient taking  differently: Inject 4.5 mg into the skin every 7 (seven) days. Mondays), Disp: 6 mL, Rfl: 3   fluticasone (FLONASE) 50 MCG/ACT nasal spray, Place 2 sprays into both nostrils daily. (Patient taking differently: Place 2 sprays into both nostrils as needed.), Disp: 16 g, Rfl: 6   gabapentin (NEURONTIN) 300 MG capsule, Take 300 mg by mouth daily., Disp: , Rfl:    glucose blood test strip, OneTouch Verio strips, Disp: , Rfl:    insulin aspart (NOVOLOG) 100 UNIT/ML injection, Insulin pump, Disp: , Rfl:    Insulin Disposable Pump (OMNIPOD DASH PODS, GEN 4,) MISC, USE 1 POD EACH EVERY 72    HOURS, Disp: , Rfl:    Insulin Human (INSULIN PUMP) SOLN, Per endocrinoloy, Disp: , Rfl:    loratadine (CLARITIN) 10 MG tablet, Take 1 tablet by mouth daily., Disp: , Rfl:    meclizine (ANTIVERT) 12.5 MG tablet, Take 1 tablet (12.5 mg total) by mouth 3 (three) times daily as needed for dizziness., Disp: 30 tablet, Rfl: 0   OXYGEN, Place 2 L into the nose continuous., Disp: , Rfl:    PARoxetine (PAXIL) 40 MG tablet, Take 1 tablet (40 mg total) by mouth daily., Disp: 90 tablet, Rfl: 1   Spacer/Aero-Holding Chambers (OPTICHAMBER DIAMOND) MISC, Use with inhaler to ensure medication is delivered throughout lungs, Disp: 1 each, Rfl: 0   torsemide (DEMADEX) 20 MG tablet, Take 1 tablet (20 mg) by mouth once daily, Disp: 270 tablet, Rfl: 1   traMADol (ULTRAM) 50 MG tablet, Take 1 tablet (50 mg total) by mouth every 12 (twelve) hours as needed for severe pain., Disp: 60 tablet, Rfl: 2   Vitamin D, Ergocalciferol, (DRISDOL) 1.25 MG (50000 UNIT) CAPS capsule, Take 50,000 Units by mouth every 7 (seven) days. Sunday, Disp: , Rfl:   Physical exam:  Vitals:   08/19/22 1423  BP: 139/63  Pulse: 78  Resp: 19  Temp: (!) 96.3 F (35.7 C)  SpO2: 91%  Weight: 219 lb 3.2 oz (99.4 kg)   Physical Exam Constitutional:      Comments: Appears frail. Sitting in a wheelchair. Appears in no acute distress         Latest Ref Rng & Units  07/27/2022   10:55 AM  CMP  Glucose 70 - 99 mg/dL 130   BUN 8 - 23 mg/dL 47   Creatinine 8.65 - 1.00 mg/dL 7.84   Sodium 696 - 295 mmol/L 140   Potassium 3.5 - 5.1 mmol/L 3.8   Chloride 98 - 111 mmol/L 108   CO2 22 - 32 mmol/L 22   Calcium 8.9 - 10.3 mg/dL 7.1       Latest Ref Rng & Units 08/19/2022    2:04 PM  CBC  WBC 4.0 - 10.5 K/uL 9.7   Hemoglobin 12.0 - 15.0 g/dL 7.1   Hematocrit 28.4 - 46.0 % 25.0   Platelets 150 - 400 K/uL 374     Assessment and plan- Patient is a 67 y.o. female who is here for following issues:  Iron deficiency anemia: Despite giving her frequent doses of IV iron her most recent CBC from 08/09/2022 showed hemoglobin of 5.1 and hematocrit of 18.1.  White count was normal and platelets mildly elevated at 497.  Ferritin levels were still low at 11 with an iron saturation of 5%.  There is therefore a continued component of iron deficiency.  We have given her 1 unit of PRBC transfusion and we are continuing with IV iron.  Have also reached out to [facility] to see if there is any other intervention needed from their side given her worsening iron deficiency anemia despite a negative GI workup.  She will be getting 1 dose of Monoferric today.  CBC in 2 weeks 4 weeks in 6 weeks with repeat iron studies in 6 weeks and see me thereafter  Interestingly patient's JAK2 mutation is positive which was ordered for her workup of thrombocytosis.  Ideally JAK2 positive thrombocytosis is in patients more than 68 years of age warrants the use of Hydrea to keep platelet counts less than 400 given the risk of cardiovascular events including heart attacks and strokes.  Today her platelet count is 374 and given her ongoing worsening anemia I am not starting her on Hydrea yet.   Visit Diagnosis 1. Iron deficiency anemia due to chronic blood loss      Dr. Owens Shark, MD, MPH [facility] [phone] 08/22/2022 11:50 AM

## 2022-08-23 ENCOUNTER — Ambulatory Visit: Payer: Medicare Other | Admitting: Cardiology

## 2022-08-23 ENCOUNTER — Telehealth: Payer: Self-pay

## 2022-08-23 NOTE — Telephone Encounter (Signed)
Patient left a voicemail and states that you left her a voicemail about a appointment. I did not see no documentation where you had called her. I called her on Friday and schedule her a appointment with dr. Allegra Lai on 09/22/2022 with dr. Allegra Lai

## 2022-08-24 ENCOUNTER — Ambulatory Visit: Payer: Medicare Other | Attending: Family | Admitting: Family

## 2022-08-24 ENCOUNTER — Encounter: Payer: Self-pay | Admitting: Family

## 2022-08-24 VITALS — BP 153/81 | HR 75 | Wt 213.6 lb

## 2022-08-24 DIAGNOSIS — I739 Peripheral vascular disease, unspecified: Secondary | ICD-10-CM | POA: Diagnosis not present

## 2022-08-24 DIAGNOSIS — I132 Hypertensive heart and chronic kidney disease with heart failure and with stage 5 chronic kidney disease, or end stage renal disease: Secondary | ICD-10-CM | POA: Insufficient documentation

## 2022-08-24 DIAGNOSIS — N186 End stage renal disease: Secondary | ICD-10-CM | POA: Diagnosis not present

## 2022-08-24 DIAGNOSIS — R42 Dizziness and giddiness: Secondary | ICD-10-CM | POA: Insufficient documentation

## 2022-08-24 DIAGNOSIS — I5032 Chronic diastolic (congestive) heart failure: Secondary | ICD-10-CM | POA: Diagnosis not present

## 2022-08-24 DIAGNOSIS — Z794 Long term (current) use of insulin: Secondary | ICD-10-CM | POA: Insufficient documentation

## 2022-08-24 DIAGNOSIS — E1122 Type 2 diabetes mellitus with diabetic chronic kidney disease: Secondary | ICD-10-CM | POA: Insufficient documentation

## 2022-08-24 DIAGNOSIS — I5022 Chronic systolic (congestive) heart failure: Secondary | ICD-10-CM | POA: Insufficient documentation

## 2022-08-24 DIAGNOSIS — N184 Chronic kidney disease, stage 4 (severe): Secondary | ICD-10-CM

## 2022-08-24 DIAGNOSIS — E1129 Type 2 diabetes mellitus with other diabetic kidney complication: Secondary | ICD-10-CM

## 2022-08-24 DIAGNOSIS — E1151 Type 2 diabetes mellitus with diabetic peripheral angiopathy without gangrene: Secondary | ICD-10-CM | POA: Diagnosis not present

## 2022-08-24 DIAGNOSIS — I10 Essential (primary) hypertension: Secondary | ICD-10-CM | POA: Diagnosis not present

## 2022-08-24 DIAGNOSIS — Z992 Dependence on renal dialysis: Secondary | ICD-10-CM | POA: Diagnosis not present

## 2022-08-24 DIAGNOSIS — D631 Anemia in chronic kidney disease: Secondary | ICD-10-CM | POA: Diagnosis not present

## 2022-08-24 DIAGNOSIS — R609 Edema, unspecified: Secondary | ICD-10-CM | POA: Diagnosis not present

## 2022-08-24 DIAGNOSIS — Z8673 Personal history of transient ischemic attack (TIA), and cerebral infarction without residual deficits: Secondary | ICD-10-CM | POA: Diagnosis not present

## 2022-08-24 DIAGNOSIS — Z79899 Other long term (current) drug therapy: Secondary | ICD-10-CM | POA: Diagnosis not present

## 2022-08-24 DIAGNOSIS — R058 Other specified cough: Secondary | ICD-10-CM | POA: Insufficient documentation

## 2022-08-24 DIAGNOSIS — R0602 Shortness of breath: Secondary | ICD-10-CM | POA: Insufficient documentation

## 2022-08-24 DIAGNOSIS — F32A Depression, unspecified: Secondary | ICD-10-CM | POA: Diagnosis not present

## 2022-08-24 DIAGNOSIS — I428 Other cardiomyopathies: Secondary | ICD-10-CM | POA: Diagnosis not present

## 2022-08-24 DIAGNOSIS — J449 Chronic obstructive pulmonary disease, unspecified: Secondary | ICD-10-CM | POA: Insufficient documentation

## 2022-08-24 DIAGNOSIS — F419 Anxiety disorder, unspecified: Secondary | ICD-10-CM | POA: Insufficient documentation

## 2022-08-24 DIAGNOSIS — Z7985 Long-term (current) use of injectable non-insulin antidiabetic drugs: Secondary | ICD-10-CM | POA: Diagnosis not present

## 2022-08-24 NOTE — Progress Notes (Signed)
PCP: Merita Norton, FNP (last seen 12/23) Primary Cardiologist: Debbe Odea, MD (last seen 05/24)  HPI:  Jocelyn Wells is a 67 y/o female with a history of DM, HTN, CKD, stroke, anemia, anxiety, COPD, depression, leukocytosis, tobacco use and chronic heart failure. Had had graft placed in her left arm 06/18/22 for upcoming dialysis  Was in the ED 02/26/22 due to abnormal labs. Admitted 01/01/22 due to fatigue and dyspnea, also increasing bilateral lower extremity swelling. On presentation she was requiring 2 L of oxygen to maintain oxygen saturation.  Chest x-ray showed cardiomegaly, mild vascular congestion, bibasilar edema. Needed blood transfusion due to anemia. Initially given IV lasix. Transitioned to oral diuretics. Wound consult done. Discharged after 3 days. Was in the ED 12/07/21 due to abnormal labs from PCP office but LWBS.   Echo 09/29/21: EF of 45-50% along with moderate LVH & mild LAE.  Echo 02/24/22: EF 60-65%.   RHC/LHC 03/25/15: Severe left ventricular systolic dysfunction with EF 25-30% Elevated pulmonary Order wedge pressure and upper normal to mildly elevated LVEDP  Normal coronary arteries.  Severe systolic left ventricular dysfunction with EF 25-30%. Mildly elevated LVEDP and moderate pulmonary capillary wedge elevation.  She presents today for a HF follow-up visit with a chief complaint of productive cough for the last 2 days. Moderate fatigue with little exertion. Has associated minimal SOB with exertion, constant dizziness and pedal edema along with this. Denies chest pain, fevers, palpitations, abdominal distention, weight gain or difficulty sleeping.    Starting dialysis this Thursday  Supposed to be wearing oxygen around the clock but she currently only wears it at bedtime as she's waiting on a portable oxygen tank.  Since last here she took 2 days of increased diuretic (40mg ) and is now back to taking 20mg  daily. Wearing compression socks daily.   ROS: All  systems negative except as listed in HPI, PMH and Problem List.  SH:  Social History   Socioeconomic History   Marital status: Married    Spouse name: [name]   Number of children: 2   Years of education: Not on file   Highest education level: Bachelor's degree (e.g., BA, AB, BS)  Occupational History   Occupation: disabled    Comment: retired  Tobacco Use   Smoking status: Former    Current packs/day: 0.00    Average packs/day: 0.5 packs/day for 48.3 years (24.2 ttl pk-yrs)    Types: Cigarettes    Start date: 02/08/1974    Quit date: 05/31/2022    Years since quitting: 0.2   Smokeless tobacco: Never  Vaping Use   Vaping status: Never Used  Substance and Sexual Activity   Alcohol use: No    Alcohol/week: 0.0 standard drinks of alcohol   Drug use: No   Sexual activity: Yes    Birth control/protection: None  Other Topics Concern   Not on file  Social History Narrative   Lives at home with stepson and husband, dogs and cats   Caffeine  Drinks sweet tea.   Right handed.    Social Determinants of Health   Financial Resource Strain: Low Risk  (12/11/2021)   Overall Financial Resource Strain (CARDIA)    Difficulty of Paying Living Expenses: Not very hard  Food Insecurity: No Food Insecurity (01/05/2022)   Hunger Vital Sign    Worried About Running Out of Food in the Last Year: Never true    Ran Out of Food in the Last Year: Never true  Transportation Needs: No Transportation Needs (01/05/2022)  PRAPARE - Administrator, Civil Service (Medical): No    Lack of Transportation (Non-Medical): No  Physical Activity: Inactive (06/01/2022)   Exercise Vital Sign    Days of Exercise per Week: 0 days    Minutes of Exercise per Session: 0 min  Stress: No Stress Concern Present (06/01/2022)   [employer] of Occupational Health - Occupational Stress Questionnaire    Feeling of Stress : Only a little  Social Connections: Moderately Isolated (12/11/2021)   Social  Connection and Isolation Panel [NHANES]    Frequency of Communication with Friends and Family: More than three times a week    Frequency of Social Gatherings with Friends and Family: Once a week    Attends Religious Services: Never    Database administrator or Organizations: No    Attends Banker Meetings: Never    Marital Status: Married  Catering manager Violence: Not At Risk (01/02/2022)   Humiliation, Afraid, Rape, and Kick questionnaire    Fear of Current or Ex-Partner: No    Emotionally Abused: No    Physically Abused: No    Sexually Abused: No    FH:  Family History  Problem Relation Age of Onset   Heart disease Mother        died from CHF   Asthma Mother    Diabetes Mother    Heart disease Father    Aneurysm Father    COPD Brother    Diabetes Brother    Alcohol abuse Paternal Aunt    Cancer Maternal Grandmother        unknownorigin   Anemia Neg Hx    Arrhythmia Neg Hx    Clotting disorder Neg Hx    Fainting Neg Hx    Heart attack Neg Hx    Heart failure Neg Hx    Hyperlipidemia Neg Hx    Hypertension Neg Hx     Past Medical History:  Diagnosis Date   Acute ischemic left MCA stroke (HCC) 01/29/2015   a.) MRI brain 01/29/2015: acute left MCA territory infarcts involving the posterior left frontal lobe and left insular cortex   Anemia of chronic renal failure    Anxiety    Arthritis    Complication of anesthesia    a.) PONV; b.) awareness under anesthesia   COPD (chronic obstructive pulmonary disease) (HCC)    Depression    Dyspnea    ESRD (end stage renal disease) (HCC)    Headache    HFrEF (heart failure with reduced ejection fraction) (HCC)    a.) TTE 01/30/2015: EF 30-35%, diff HK, LAE, G2DD; b.) TTE 06/13/2015: EF 40-45%, diff HK, G1DD; c.) TTE 10/06/2017: EF 60-65% MAC, no IAS; d.) TTE 09/29/2021: EF 45-50%;, LVH, LAE, AoV sclerosis, G1DD; e.) TTE 02/24/2022: EF 60-65%, no IAS   HTN (hypertension)    Leukocytosis    Long term current  use of anticoagulant    a.) apixaban   Long term current use of antithrombotics/antiplatelets    a.) clopidogrel   NICM (nonischemic cardiomyopathy) (HCC)    a.) TTE 01/30/2015: EF 30-35%; b.) R/LHC 03/25/2015: EF 25-30%, mRA 13, mPA 30, mPCWP 26, LVEDP 17, CO 3.66, CI 1.8; c.) TTE 06/13/2015: EF 40-45%; d.) TTE 10/06/2017: EF 60-65%; e.) TTE 09/29/2021: EF 45-50%; f.) TTE 02/24/2022: EF 60-65%   Obesity    On supplemental oxygen by nasal cannula    a.) 2L/[place] at night   Osteoporosis    Peripheral vascular disease (HCC)  Type 2 diabetes mellitus treated with insulin (HCC)     Current Outpatient Medications  Medication Sig Dispense Refill   albuterol (VENTOLIN HFA) 108 (90 Base) MCG/ACT inhaler TAKE 2 PUFFS BY MOUTH EVERY 6 HOURS AS NEEDED FOR WHEEZE OR SHORTNESS OF BREATH (Patient taking differently: Inhale 2 puffs into the lungs every 6 (six) hours as needed for wheezing or shortness of breath.) 8.5 each 2   apixaban (ELIQUIS) 5 MG TABS tablet Take 1 tablet (5 mg total) by mouth 2 (two) times daily. 180 tablet 2   atorvastatin (LIPITOR) 40 MG tablet Take 1 tablet (40 mg total) by mouth daily. 90 tablet 3   buPROPion (WELLBUTRIN SR) 150 MG 12 hr tablet Take 1 tablet (150 mg total) by mouth 2 (two) times daily. 180 tablet 1   calcitRIOL (ROCALTROL) 0.25 MCG capsule Take 0.25 mcg by mouth daily.     carvedilol (COREG) 6.25 MG tablet Take 1 tablet (6.25 mg total) by mouth 2 (two) times daily. 180 tablet 3   Dulaglutide (TRULICITY) 4.5 MG/0.5ML SOPN Inject 4.5 mg into the skin every 7 (seven) days. (Patient taking differently: Inject 4.5 mg into the skin every 7 (seven) days. Mondays) 6 mL 3   fluticasone (FLONASE) 50 MCG/ACT nasal spray Place 2 sprays into both nostrils daily. (Patient taking differently: Place 2 sprays into both nostrils as needed.) 16 g 6   gabapentin (NEURONTIN) 300 MG capsule Take 300 mg by mouth daily.     glucose blood test strip OneTouch Verio strips     insulin aspart  (NOVOLOG) 100 UNIT/ML injection Insulin pump     Insulin Disposable Pump (OMNIPOD DASH PODS, GEN 4,) MISC USE 1 POD EACH EVERY 72    HOURS     Insulin Human (INSULIN PUMP) SOLN Per endocrinoloy     loratadine (CLARITIN) 10 MG tablet Take 1 tablet by mouth daily.     meclizine (ANTIVERT) 12.5 MG tablet Take 1 tablet (12.5 mg total) by mouth 3 (three) times daily as needed for dizziness. 30 tablet 0   OXYGEN Place 2 L into the nose continuous.     PARoxetine (PAXIL) 40 MG tablet Take 1 tablet (40 mg total) by mouth daily. 90 tablet 1   Spacer/Aero-Holding Chambers (OPTICHAMBER DIAMOND) MISC Use with inhaler to ensure medication is delivered throughout lungs 1 each 0   torsemide (DEMADEX) 20 MG tablet Take 1 tablet (20 mg) by mouth once daily 270 tablet 1   traMADol (ULTRAM) 50 MG tablet Take 1 tablet (50 mg total) by mouth every 12 (twelve) hours as needed for severe pain. 60 tablet 2   Vitamin D, Ergocalciferol, (DRISDOL) 1.25 MG (50000 UNIT) CAPS capsule Take 50,000 Units by mouth every 7 (seven) days. Sunday     No current facility-administered medications for this visit.   Vitals:   08/24/22 1326  BP: (!) 153/81  Pulse: 75  SpO2: 90%  Weight: 213 lb 9.6 oz (96.9 kg)   Wt Readings from Last 3 Encounters:  08/24/22 213 lb 9.6 oz (96.9 kg)  08/19/22 219 lb 3.2 oz (99.4 kg)  08/03/22 221 lb (100.2 kg)   Lab Results  Component Value Date   CREATININE 3.11 (H) 07/27/2022   CREATININE 3.60 (H) 06/18/2022   CREATININE 3.27 (H) 02/26/2022   PHYSICAL EXAM:  General:  Well appearing. No resp difficulty HEENT: normal Neck: supple. JVP flat. No lymphadenopathy or thryomegaly appreciated. Cor: PMI normal. Regular rate & rhythm. No rubs, gallops or murmurs. Lungs: expiratory wheezing  in RLL Abdomen: soft, nontender, nondistended. No hepatosplenomegaly. No bruits or masses. Good bowel sounds. Extremities: no cyanosis, clubbing, rash, 1+ pitting bilateral lower legs Neuro: alert & oriented  x3, cranial nerves grossly intact. Moves all 4 extremities w/o difficulty. Affect pleasant.   ECG: 06/07/22 was NSR  ReDs: 39%   ASSESSMENT & PLAN:  1: NICM with preserved ejection fraction- - suspect due to CKD as cath in 2017 showed normal coronary arteries - NYHA class III - euvolemic - weighing daily; reminded to call for an overnight weight gain of > 2 pounds or a weekly weight gain of > 5 pounds - weight down 8 pounds from last visit here 3 weeks ago - ReDs 39% (getting ready to start dialysis); 3 weeks ago it was 40% - Echo 09/29/21: EF of 45-50% along with moderate LVH & mild LAE - echo 02/24/22: EF 60-65%. .  - RHC/LHC 03/25/15: Severe left ventricular systolic dysfunction with EF 25-30%. Elevated pulmonary Order wedge pressure and upper normal to mildly elevated LVEDP. Normal coronary arteries. Severe systolic left ventricular dysfunction with EF 25-30%. Mildly elevated LVEDP and moderate pulmonary capillary wedge elevation. - not adding salt to her food - emphasized knowing how much fluids she is taking in; measure her cup so she knows how many ounces in the cup; keep daily fluid intake to 40 ounces unless told otherwise by nephrology - continue torsemide 20mg  daily  - due to renal function, not a candidate for spironolactone  - consider resuming low dose beta-blocker if BP remains good but will hold off until she gets started with dialysis - emphasized using her walker whenever she is walking - saw cardiology (Agbor-Etang) 05/24 - saw [facility] provider (Bensimhon) 12/23 - has compression socks on both lower legs - BNP 01/26/22 was 2195.7 - participating in ventricle health program  2: HTN with CKD- - BP 153/81 - saw PCP Suzie Portela) 12/23 - BMP 08/16/22 reviewed and showed sodium 144, potassium 4.9, creatinine 3.13 & GFR 16 - saw nephrology Thedore Mins) 07/24  3: COPD- - saw pulmonology Jayme Cloud) 12/23 - wears oxygen at 2L at bedtime - waiting on portable tank so that she can wear  oxygen 24/7  4: Anemia- - went to cancer center 08/19/22 - hemoglobin 08/19/22 was 7.1 - received iron infusion 08/19/22 - says that there's been a discussion about doing a bone marrow biopsy  5: DM- - saw endocrinology (Solum) 06/24 - A1c 04/22/22 was 7.8; previously was 10.1%  6: PAD- - saw vascular Manson Passey) 06/24 - had graft in left arm placed 06/18/22 - dialysis will start in 2 days  Return in 3 months, sooner if needed

## 2022-08-24 NOTE — Telephone Encounter (Signed)
Called patient and left  a detail message explaining to patient that no one from our office called her yesterday but to call us if she had any questions

## 2022-08-26 DIAGNOSIS — N2581 Secondary hyperparathyroidism of renal origin: Secondary | ICD-10-CM | POA: Diagnosis not present

## 2022-08-26 DIAGNOSIS — D509 Iron deficiency anemia, unspecified: Secondary | ICD-10-CM | POA: Diagnosis not present

## 2022-08-26 DIAGNOSIS — N250 Renal osteodystrophy: Secondary | ICD-10-CM | POA: Diagnosis not present

## 2022-08-26 DIAGNOSIS — D631 Anemia in chronic kidney disease: Secondary | ICD-10-CM | POA: Diagnosis not present

## 2022-08-26 DIAGNOSIS — Z992 Dependence on renal dialysis: Secondary | ICD-10-CM | POA: Diagnosis not present

## 2022-08-26 DIAGNOSIS — N186 End stage renal disease: Secondary | ICD-10-CM | POA: Diagnosis not present

## 2022-08-27 DIAGNOSIS — E113513 Type 2 diabetes mellitus with proliferative diabetic retinopathy with macular edema, bilateral: Secondary | ICD-10-CM | POA: Diagnosis not present

## 2022-08-28 DIAGNOSIS — N250 Renal osteodystrophy: Secondary | ICD-10-CM | POA: Diagnosis not present

## 2022-08-28 DIAGNOSIS — N186 End stage renal disease: Secondary | ICD-10-CM | POA: Diagnosis not present

## 2022-08-28 DIAGNOSIS — D631 Anemia in chronic kidney disease: Secondary | ICD-10-CM | POA: Diagnosis not present

## 2022-08-28 DIAGNOSIS — Z992 Dependence on renal dialysis: Secondary | ICD-10-CM | POA: Diagnosis not present

## 2022-08-28 DIAGNOSIS — D509 Iron deficiency anemia, unspecified: Secondary | ICD-10-CM | POA: Diagnosis not present

## 2022-08-28 DIAGNOSIS — N2581 Secondary hyperparathyroidism of renal origin: Secondary | ICD-10-CM | POA: Diagnosis not present

## 2022-08-30 ENCOUNTER — Telehealth: Payer: Self-pay

## 2022-08-30 NOTE — Telephone Encounter (Signed)
-----   Message from [facility] sent at 08/28/2022  3:24 PM EDT ----- Regarding: RE: Lacie Scotts  Please schedule endoscopic placement of video capsule EGD with givens capsule study IDA  RV ----- Message ----- From: Creig Hines, MD Sent: 08/13/2022   3:04 PM EDT To: Toney Reil, MD; Wyline Mood, MD Subject: IDA                                            This one

## 2022-08-30 NOTE — Telephone Encounter (Signed)
Called and left a message for call back  

## 2022-08-31 ENCOUNTER — Other Ambulatory Visit: Payer: Self-pay

## 2022-08-31 DIAGNOSIS — N2581 Secondary hyperparathyroidism of renal origin: Secondary | ICD-10-CM | POA: Diagnosis not present

## 2022-08-31 DIAGNOSIS — N186 End stage renal disease: Secondary | ICD-10-CM | POA: Diagnosis not present

## 2022-08-31 DIAGNOSIS — D509 Iron deficiency anemia, unspecified: Secondary | ICD-10-CM | POA: Diagnosis not present

## 2022-08-31 DIAGNOSIS — N250 Renal osteodystrophy: Secondary | ICD-10-CM | POA: Diagnosis not present

## 2022-08-31 DIAGNOSIS — Z992 Dependence on renal dialysis: Secondary | ICD-10-CM | POA: Diagnosis not present

## 2022-08-31 DIAGNOSIS — D631 Anemia in chronic kidney disease: Secondary | ICD-10-CM | POA: Diagnosis not present

## 2022-08-31 NOTE — Telephone Encounter (Signed)
Patient want to schedule EGD Capsule on 10/12/2022.

## 2022-08-31 NOTE — Telephone Encounter (Signed)
Called and left a message for call back  

## 2022-09-01 ENCOUNTER — Encounter: Payer: Self-pay | Admitting: Gastroenterology

## 2022-09-01 ENCOUNTER — Telehealth: Payer: Self-pay

## 2022-09-01 ENCOUNTER — Encounter: Payer: Self-pay | Admitting: Oncology

## 2022-09-01 DIAGNOSIS — I10 Essential (primary) hypertension: Secondary | ICD-10-CM | POA: Diagnosis not present

## 2022-09-01 DIAGNOSIS — R809 Proteinuria, unspecified: Secondary | ICD-10-CM | POA: Diagnosis not present

## 2022-09-01 DIAGNOSIS — E113393 Type 2 diabetes mellitus with moderate nonproliferative diabetic retinopathy without macular edema, bilateral: Secondary | ICD-10-CM | POA: Diagnosis not present

## 2022-09-01 DIAGNOSIS — E1159 Type 2 diabetes mellitus with other circulatory complications: Secondary | ICD-10-CM | POA: Diagnosis not present

## 2022-09-01 DIAGNOSIS — E1142 Type 2 diabetes mellitus with diabetic polyneuropathy: Secondary | ICD-10-CM | POA: Diagnosis not present

## 2022-09-01 DIAGNOSIS — Z9641 Presence of insulin pump (external) (internal): Secondary | ICD-10-CM | POA: Diagnosis not present

## 2022-09-01 DIAGNOSIS — E1122 Type 2 diabetes mellitus with diabetic chronic kidney disease: Secondary | ICD-10-CM | POA: Diagnosis not present

## 2022-09-01 DIAGNOSIS — M810 Age-related osteoporosis without current pathological fracture: Secondary | ICD-10-CM | POA: Diagnosis not present

## 2022-09-01 DIAGNOSIS — N185 Chronic kidney disease, stage 5: Secondary | ICD-10-CM | POA: Diagnosis not present

## 2022-09-01 DIAGNOSIS — E1121 Type 2 diabetes mellitus with diabetic nephropathy: Secondary | ICD-10-CM | POA: Diagnosis not present

## 2022-09-01 DIAGNOSIS — Z794 Long term (current) use of insulin: Secondary | ICD-10-CM | POA: Diagnosis not present

## 2022-09-01 DIAGNOSIS — E1129 Type 2 diabetes mellitus with other diabetic kidney complication: Secondary | ICD-10-CM | POA: Diagnosis not present

## 2022-09-01 NOTE — Telephone Encounter (Signed)
Patient is on Plavix for non-cardiac indication. Request to hold Plavix should be addressed by prescribing provider.  Patient on Eliquis for hx of DVT as well as PAF. Routing to pharmacy for guidance.

## 2022-09-01 NOTE — Telephone Encounter (Signed)
Went over instructions, mailed them and sent to [facility], sent blood thinner request.

## 2022-09-01 NOTE — Telephone Encounter (Signed)
Procedure: EGD and capsule study Date of procedure: 10/12/22  CrCl 4mL/min, per last HF clinic note plan was to start dialysis last week  Platelet count 360K  I spent about 20 minutes looking into this and am still a bit confused. I can't find evidence that patient has afib. She had Linq monitor placed in 2017 for unprovoked DVT, Dr Odessa Fleming last note in 04/2019 stated: "The patient has had no interval atrial fibrillation. She should be on aspirin for her cryptogenic stroke; however, in the context of her history of an unprovoked DVT I am wondering whether the better long-term strategy is not anticoagulation. I will talk about this with some colleagues." She then did not see cardiology again until 07/2021 and that office note mentions afib at the top of the note, but it's not actually listed under her PMH and I don't see that Linq ever picked up any afib (reported through 2021). Seems like her onging indication is prevention of recurrent DVT, however that dose of Eliquis would be 2.5mg  BID and she's currently on 5mg  BID which would be the correct dose for afib indication. Hoping preop team can find something I'm missing? If she does have documented afib, we can clear her, but if she doesn't, then anticoag indication is DVT prevention, her Eliquis dose is incorrect, and would prefer PCP to provide clearance.

## 2022-09-01 NOTE — Telephone Encounter (Signed)
   [facility] Pre-operative Risk Assessment    Request for surgical clearance:  What type of surgery is being performed? EGD and capsule study   When is this surgery scheduled? 10/12/2022   Are there any medications that need to be held prior to surgery and how long?Eliquis   Practice name and name of physician performing surgery? [facility]    What is your office phone and fax number? [phone]   Anesthesia type (None, local, MAC, general) ? General    Jocelyn Wells 09/01/2022, 8:38 AM  _________________________________________________________________   (provider comments below)

## 2022-09-02 ENCOUNTER — Telehealth: Payer: Self-pay

## 2022-09-02 ENCOUNTER — Inpatient Hospital Stay: Payer: Medicare Other

## 2022-09-02 DIAGNOSIS — M8588 Other specified disorders of bone density and structure, other site: Secondary | ICD-10-CM | POA: Diagnosis not present

## 2022-09-02 DIAGNOSIS — N186 End stage renal disease: Secondary | ICD-10-CM | POA: Diagnosis not present

## 2022-09-02 DIAGNOSIS — N2581 Secondary hyperparathyroidism of renal origin: Secondary | ICD-10-CM | POA: Diagnosis not present

## 2022-09-02 DIAGNOSIS — D500 Iron deficiency anemia secondary to blood loss (chronic): Secondary | ICD-10-CM

## 2022-09-02 DIAGNOSIS — N250 Renal osteodystrophy: Secondary | ICD-10-CM | POA: Diagnosis not present

## 2022-09-02 DIAGNOSIS — Z79899 Other long term (current) drug therapy: Secondary | ICD-10-CM | POA: Diagnosis not present

## 2022-09-02 DIAGNOSIS — D631 Anemia in chronic kidney disease: Secondary | ICD-10-CM | POA: Diagnosis not present

## 2022-09-02 DIAGNOSIS — D72829 Elevated white blood cell count, unspecified: Secondary | ICD-10-CM | POA: Diagnosis not present

## 2022-09-02 DIAGNOSIS — D509 Iron deficiency anemia, unspecified: Secondary | ICD-10-CM | POA: Diagnosis not present

## 2022-09-02 DIAGNOSIS — Z992 Dependence on renal dialysis: Secondary | ICD-10-CM | POA: Diagnosis not present

## 2022-09-02 LAB — CBC WITH DIFFERENTIAL/PLATELET
Abs Immature Granulocytes: 0.06 10*3/uL (ref 0.00–0.07)
Basophils Absolute: 0.1 10*3/uL (ref 0.0–0.1)
Basophils Relative: 1 %
Eosinophils Absolute: 0.4 10*3/uL (ref 0.0–0.5)
Eosinophils Relative: 3 %
HCT: 34 % — ABNORMAL LOW (ref 36.0–46.0)
Hemoglobin: 9.7 g/dL — ABNORMAL LOW (ref 12.0–15.0)
Immature Granulocytes: 0 %
Lymphocytes Relative: 7 %
Lymphs Abs: 1 10*3/uL (ref 0.7–4.0)
MCH: 25.5 pg — ABNORMAL LOW (ref 26.0–34.0)
MCHC: 28.5 g/dL — ABNORMAL LOW (ref 30.0–36.0)
MCV: 89.2 fL (ref 80.0–100.0)
Monocytes Absolute: 1 10*3/uL (ref 0.1–1.0)
Monocytes Relative: 7 %
Neutro Abs: 11.8 10*3/uL — ABNORMAL HIGH (ref 1.7–7.7)
Neutrophils Relative %: 82 %
Platelets: 499 10*3/uL — ABNORMAL HIGH (ref 150–400)
RBC: 3.81 MIL/uL — ABNORMAL LOW (ref 3.87–5.11)
RDW: 23.3 % — ABNORMAL HIGH (ref 11.5–15.5)
WBC: 14.3 10*3/uL — ABNORMAL HIGH (ref 4.0–10.5)
nRBC: 0 % (ref 0.0–0.2)

## 2022-09-02 LAB — HM DEXA SCAN

## 2022-09-02 NOTE — Telephone Encounter (Signed)
  Patient Consent for Virtual Visit        Jocelyn Wells has provided verbal consent on 09/02/2022 for a virtual visit (video or telephone).   CONSENT FOR VIRTUAL VISIT FOR:  Jocelyn Wells  By participating in this virtual visit I agree to the following:  I hereby voluntarily request, consent and authorize [facility] and its employed or contracted physicians, physician assistants, nurse practitioners or other licensed health care professionals (the Practitioner), to provide me with telemedicine health care services (the "Services") as deemed necessary by the treating Practitioner. I acknowledge and consent to receive the Services by the Practitioner via telemedicine. I understand that the telemedicine visit will involve communicating with the Practitioner through live audiovisual communication technology and the disclosure of certain medical information by electronic transmission. I acknowledge that I have been given the opportunity to request an in-person assessment or other available alternative prior to the telemedicine visit and am voluntarily participating in the telemedicine visit.  I understand that I have the right to withhold or withdraw my consent to the use of telemedicine in the course of my care at any time, without affecting my right to future care or treatment, and that the Practitioner or I may terminate the telemedicine visit at any time. I understand that I have the right to inspect all information obtained and/or recorded in the course of the telemedicine visit and may receive copies of available information for a reasonable fee.  I understand that some of the potential risks of receiving the Services via telemedicine include:  Delay or interruption in medical evaluation due to technological equipment failure or disruption; Information transmitted may not be sufficient (e.g. poor resolution of images) to allow for appropriate medical decision making by the Practitioner;  and/or  In rare instances, security protocols could fail, causing a breach of personal health information.  Furthermore, I acknowledge that it is my responsibility to provide information about my medical history, conditions and care that is complete and accurate to the best of my ability. I acknowledge that Practitioner's advice, recommendations, and/or decision may be based on factors not within their control, such as incomplete or inaccurate data provided by me or distortions of diagnostic images or specimens that may result from electronic transmissions. I understand that the practice of medicine is not an exact science and that Practitioner makes no warranties or guarantees regarding treatment outcomes. I acknowledge that a copy of this consent can be made available to me via my patient portal [facility] MyChart), or I can request a printed copy by calling the office of  [facility].    I understand that my insurance will be billed for this visit.   I have read or had this consent read to me. I understand the contents of this consent, which adequately explains the benefits and risks of the Services being provided via telemedicine.  I have been provided ample opportunity to ask questions regarding this consent and the Services and have had my questions answered to my satisfaction. I give my informed consent for the services to be provided through the use of telemedicine in my medical care

## 2022-09-02 NOTE — Telephone Encounter (Signed)
Patient has now been scheduled for cardiac pre-op clearance on 09/20/22 @ 10:20 a.m. Med rec and consent provided.

## 2022-09-02 NOTE — Telephone Encounter (Signed)
Patient is on Eliquis due to history of DVT. This is managed by PCP. Pharmacy requests that this be addressed by PCP. It is also recommended that the dose of Eliquis be reduced to 2.5 mg BID.  Clarisa Kindred, FNP and Merita Norton FNP are listed as PCP.

## 2022-09-02 NOTE — Telephone Encounter (Signed)
   Name: Jocelyn Wells  DOB: 25-Jun-1955  MRN: [id number]  Primary Cardiologist: None   Preoperative team, please contact this patient and set up a phone call appointment for further preoperative risk assessment. Please obtain consent and complete medication review. Thank you for your help.  I confirm that guidance regarding antiplatelet and oral anticoagulation therapy has been completed and, if necessary, noted below.  Eliquis and Plavix are managed by PCP. Defer to them for recommendations on holding.    [name], NP 09/02/2022, 7:32 AM [facility]

## 2022-09-03 ENCOUNTER — Ambulatory Visit: Payer: Medicare Other

## 2022-09-03 DIAGNOSIS — I5032 Chronic diastolic (congestive) heart failure: Secondary | ICD-10-CM | POA: Diagnosis not present

## 2022-09-03 DIAGNOSIS — I428 Other cardiomyopathies: Secondary | ICD-10-CM | POA: Insufficient documentation

## 2022-09-04 ENCOUNTER — Other Ambulatory Visit: Payer: Self-pay | Admitting: Family Medicine

## 2022-09-04 DIAGNOSIS — N250 Renal osteodystrophy: Secondary | ICD-10-CM | POA: Diagnosis not present

## 2022-09-04 DIAGNOSIS — Z992 Dependence on renal dialysis: Secondary | ICD-10-CM | POA: Diagnosis not present

## 2022-09-04 DIAGNOSIS — N186 End stage renal disease: Secondary | ICD-10-CM | POA: Diagnosis not present

## 2022-09-04 DIAGNOSIS — N2581 Secondary hyperparathyroidism of renal origin: Secondary | ICD-10-CM | POA: Diagnosis not present

## 2022-09-04 DIAGNOSIS — F339 Major depressive disorder, recurrent, unspecified: Secondary | ICD-10-CM

## 2022-09-04 DIAGNOSIS — D631 Anemia in chronic kidney disease: Secondary | ICD-10-CM | POA: Diagnosis not present

## 2022-09-04 DIAGNOSIS — D509 Iron deficiency anemia, unspecified: Secondary | ICD-10-CM | POA: Diagnosis not present

## 2022-09-06 NOTE — Telephone Encounter (Signed)
Requested Prescriptions  Pending Prescriptions Disp Refills   buPROPion (WELLBUTRIN SR) 150 MG 12 hr tablet [Pharmacy Med Name: BUPROPION HCL SR 150 MG TABLET] 180 tablet 0    Sig: TAKE 1 TABLET BY MOUTH TWICE A DAY     Psychiatry: Antidepressants - bupropion Failed - 09/04/2022  9:28 AM      Failed - Cr in normal range and within 360 days    Creat  Date Value Ref Range Status  03/21/2015 1.37 (H) 0.50 - 0.99 mg/dL Final   Creatinine, Ser  Date Value Ref Range Status  07/27/2022 3.11 (H) 0.44 - 1.00 mg/dL Final   Creatinine, Urine  Date Value Ref Range Status  04/22/2022 3.7  Final         Failed - AST in normal range and within 360 days    AST  Date Value Ref Range Status  12/15/2021 13 (L) 15 - 41 U/L Final         Failed - Last BP in normal range    BP Readings from Last 1 Encounters:  08/24/22 (!) 153/81         Passed - ALT in normal range and within 360 days    ALT  Date Value Ref Range Status  12/15/2021 10 0 - 44 U/L Final         Passed - Completed PHQ-2 or PHQ-9 in the last 360 days      Passed - Valid encounter within last 6 months    Recent Outpatient Visits           3 months ago Encounter for subsequent annual wellness visit (AWV) in Medicare patient   [place] [facility] Jacky Kindle, FNP   7 months ago Depression, recurrent [facility])   [place] [facility] Merita Norton T, FNP   8 months ago Pain of right lower extremity   [facility] Caro Laroche, DO   9 months ago Screening for colon cancer   [facility] Jacky Kindle, FNP   9 months ago Foot pain, right   [facility] [place], Darl Householder, DO       Future Appointments             In 2 weeks Vanga, Loel Dubonnet, MD [facility] [place]   In 3 weeks Charlsie Quest, NP [facility]

## 2022-09-07 DIAGNOSIS — N186 End stage renal disease: Secondary | ICD-10-CM | POA: Diagnosis not present

## 2022-09-07 DIAGNOSIS — N2581 Secondary hyperparathyroidism of renal origin: Secondary | ICD-10-CM | POA: Diagnosis not present

## 2022-09-07 DIAGNOSIS — N250 Renal osteodystrophy: Secondary | ICD-10-CM | POA: Diagnosis not present

## 2022-09-07 DIAGNOSIS — D631 Anemia in chronic kidney disease: Secondary | ICD-10-CM | POA: Diagnosis not present

## 2022-09-07 DIAGNOSIS — Z992 Dependence on renal dialysis: Secondary | ICD-10-CM | POA: Diagnosis not present

## 2022-09-07 DIAGNOSIS — D509 Iron deficiency anemia, unspecified: Secondary | ICD-10-CM | POA: Diagnosis not present

## 2022-09-08 DIAGNOSIS — Z992 Dependence on renal dialysis: Secondary | ICD-10-CM | POA: Diagnosis not present

## 2022-09-08 DIAGNOSIS — N186 End stage renal disease: Secondary | ICD-10-CM | POA: Diagnosis not present

## 2022-09-09 ENCOUNTER — Telehealth: Payer: Self-pay

## 2022-09-09 ENCOUNTER — Ambulatory Visit: Payer: Medicare Other | Admitting: Cardiology

## 2022-09-09 DIAGNOSIS — Z992 Dependence on renal dialysis: Secondary | ICD-10-CM | POA: Diagnosis not present

## 2022-09-09 DIAGNOSIS — N250 Renal osteodystrophy: Secondary | ICD-10-CM | POA: Diagnosis not present

## 2022-09-09 DIAGNOSIS — N2581 Secondary hyperparathyroidism of renal origin: Secondary | ICD-10-CM | POA: Diagnosis not present

## 2022-09-09 DIAGNOSIS — D631 Anemia in chronic kidney disease: Secondary | ICD-10-CM | POA: Diagnosis not present

## 2022-09-09 DIAGNOSIS — D509 Iron deficiency anemia, unspecified: Secondary | ICD-10-CM | POA: Diagnosis not present

## 2022-09-09 DIAGNOSIS — N186 End stage renal disease: Secondary | ICD-10-CM | POA: Diagnosis not present

## 2022-09-09 NOTE — Telephone Encounter (Signed)
Let's see what the video capsule endoscopy shows and decide about clinic follow-up.  Cancel her appointment for now  RV

## 2022-09-09 NOTE — Telephone Encounter (Signed)
Patient verbalized understanding of blood thinner instructions. Patient has appointment on 09/22/2022 because Dr. Smith Robert wanted her seen by you so I schedule her for that day when you were on vacation before you said to schedule procedure for her. Does she need this appointment or does it need to be rescheduled to after her procedures

## 2022-09-09 NOTE — Telephone Encounter (Signed)
Per Dr. Gilda Crease Patient needs to stop the Eliquis 5mg  3 days before Egd and capsule study on 10/12/2022 and restart it 2 days after procedure

## 2022-09-09 NOTE — Telephone Encounter (Signed)
Called patient and canceled the appointment for 09/22/2022 with dr. Allegra Lai

## 2022-09-11 DIAGNOSIS — N186 End stage renal disease: Secondary | ICD-10-CM | POA: Diagnosis not present

## 2022-09-11 DIAGNOSIS — N250 Renal osteodystrophy: Secondary | ICD-10-CM | POA: Diagnosis not present

## 2022-09-11 DIAGNOSIS — N2581 Secondary hyperparathyroidism of renal origin: Secondary | ICD-10-CM | POA: Diagnosis not present

## 2022-09-11 DIAGNOSIS — D509 Iron deficiency anemia, unspecified: Secondary | ICD-10-CM | POA: Diagnosis not present

## 2022-09-11 DIAGNOSIS — D631 Anemia in chronic kidney disease: Secondary | ICD-10-CM | POA: Diagnosis not present

## 2022-09-11 DIAGNOSIS — Z992 Dependence on renal dialysis: Secondary | ICD-10-CM | POA: Diagnosis not present

## 2022-09-13 ENCOUNTER — Ambulatory Visit
Payer: Medicare Other | Attending: Student in an Organized Health Care Education/Training Program | Admitting: Student in an Organized Health Care Education/Training Program

## 2022-09-13 ENCOUNTER — Encounter: Payer: Self-pay | Admitting: Student in an Organized Health Care Education/Training Program

## 2022-09-13 VITALS — BP 112/74 | HR 72 | Temp 97.5°F | Resp 16 | Ht 66.0 in | Wt 192.0 lb

## 2022-09-13 DIAGNOSIS — E1140 Type 2 diabetes mellitus with diabetic neuropathy, unspecified: Secondary | ICD-10-CM | POA: Diagnosis not present

## 2022-09-13 MED ORDER — CAPSAICIN-CLEANSING GEL 8 % EX KIT
4.0000 | PACK | Freq: Once | CUTANEOUS | Status: AC
  Administered 2022-09-13: 4 via TOPICAL
  Filled 2022-09-13: qty 4

## 2022-09-13 NOTE — Progress Notes (Signed)
PROVIDER NOTE: Interpretation of information contained herein should be left to medically-trained personnel. Specific patient instructions are provided elsewhere under "Patient Instructions" section of medical record. This document was created in part using STT-dictation technology, any transcriptional errors that may result from this process are unintentional.  Patient: Jocelyn Wells Type: Established DOB: [birth year] MRN: [id number] PCP: Jacky Kindle, FNP  Service: Procedure DOS: 09/13/2022 Setting: Ambulatory Location: Ambulatory outpatient facility Delivery: Face-to-face Provider: Edward Jolly, MD Specialty: Interventional Pain Management Specialty designation: 09 Location: Outpatient facility Ref. Prov.: Jacky Kindle, FNP       Interventional Therapy   Interventional Treatment:           Type: Qutenza Neurolysis #2  Laterality:  Bilateral Area treated: Feet Imaging Guidance: None Anesthesia/analgesia/anxiolysis/sedation: None required Medication (Right): Qutenza (capsaicin 8%) topical system Medication (Left): Qutenza (capsaicin 8%) topical system Date: 09/13/2022 Performed by: Edward Jolly, MD Rationale (medical necessity): procedure needed and proper for the treatment of [name]'s medical symptoms and needs. Indication: Painful diabetic peripheral neuralgia (DPN) (ICD-10-CM:E11.40) severe enough to impact quality of life or function. 1. Chronic painful diabetic neuropathy (HCC)    NAS-11 Pain score:   Pre-procedure: 0-No pain/10   Post-procedure: 0-No pain/10     Position / Prep / Materials:  Position: Supine  Materials: Qutenza Kit  Pre-op H&P Assessment:  Jocelyn Wells is a 67 y.o. (year old), female patient, seen today for interventional treatment. She  has a past surgical history that includes Abdominal hysterectomy; Tubal ligation (02/09/1976); Cesarean section (02/09/1976); Knee arthroscopy (Left, 02/09/2003); Ulnar nerve transposition (02/08/2006); Bilateral  salpingoophorectomy (02/08/1998); Ankle fracture surgery (Left, 02/09/2000); TEE without cardioversion (N/A, 01/31/2015); Cardiac catheterization (N/A, 03/25/2015); Cardiac catheterization (N/A, 08/05/2015); Esophagogastroduodenoscopy (egd) with propofol (N/A, 09/22/2016); Colonoscopy with propofol (N/A, 09/22/2016); Esophagogastroduodenoscopy (egd) with propofol (N/A, 12/08/2016); Lower Extremity Angiography (Left, 08/12/2021); TEMPORARY DIALYSIS CATHETER (N/A, 08/14/2021); Fracture surgery; Colonoscopy with propofol (N/A, 02/15/2022); Esophagogastroduodenoscopy (N/A, 02/15/2022); Wells capsule study (N/A, 04/19/2022); AV fistula placement (Left, 06/18/2022); and Insertion hybrid anteriovenous gortex graft (06/18/2022). [name] has a current medication list which includes the following prescription(s): albuterol, apixaban, atorvastatin, bupropion, calcitriol, carvedilol, trulicity, fluticasone, gabapentin, glucose blood, insulin aspart, omnipod dash pods (gen 4), insulin pump, loratadine, meclizine, oxygen-helium, paroxetine, optichamber diamond, torsemide, tramadol, and vitamin d (ergocalciferol). Her primarily concern today is the Foot Pain (bilat), Knee Pain (bilat), and Back Pain  Initial Vital Signs:  Pulse/HCG Rate: 77  Temp: (!) 97.5 F (36.4 C) Resp: 16 BP: (!) 118/58 SpO2: 92 %  BMI: Estimated body mass index is 30.99 kg/m as calculated from the following:   Height as of this encounter: 5\' 6"  (1.676 m).   Weight as of this encounter: 192 lb (87.1 kg).  Risk Assessment: Allergies: Reviewed. She is allergic to codeine and ivp dye [iodinated contrast media].  Allergy Precautions: None required Coagulopathies: Reviewed. None identified.  Blood-thinner therapy: None at this time Active Infection(s): Reviewed. None identified. Jocelyn Wells is afebrile  Site Confirmation: Jocelyn Wells was asked to confirm the procedure and laterality before marking the site Procedure checklist:  Completed Consent: Before the procedure and under the influence of no sedative(s), amnesic(s), or anxiolytics, the patient was informed of the treatment options, risks and possible complications. To fulfill our ethical and legal obligations, as recommended by the American Medical Association's Code of Ethics, I have informed the patient of my clinical impression; the nature and purpose of the treatment or procedure; the risks, benefits, and possible complications of the intervention; the alternatives, including  doing nothing; the risk(s) and benefit(s) of the alternative treatment(s) or procedure(s); and the risk(s) and benefit(s) of doing nothing. The patient was provided information about the general risks and possible complications associated with the procedure. These may include, but are not limited to: failure to achieve desired goals, infection, bleeding, organ or nerve damage, allergic reactions, paralysis, and death. In addition, the patient was informed of those risks and complications associated to the procedure, such as failure to decrease pain; infection; bleeding; organ or nerve damage with subsequent damage to sensory, motor, and/or autonomic systems, resulting in permanent pain, numbness, and/or weakness of one or several areas of the body; allergic reactions; (i.e.: anaphylactic reaction); and/or death. Furthermore, the patient was informed of those risks and complications associated with the medications. These include, but are not limited to: allergic reactions (i.e.: anaphylactic or anaphylactoid reaction(s)); adrenal axis suppression; blood sugar elevation that in diabetics may result in ketoacidosis or comma; water retention that in patients with history of congestive heart failure may result in shortness of breath, pulmonary edema, and decompensation with resultant heart failure; weight gain; swelling or edema; medication-induced neural toxicity; particulate matter embolism and blood vessel  occlusion with resultant organ, and/or nervous system infarction; and/or aseptic necrosis of one or more joints. Finally, the patient was informed that Medicine is not an exact science; therefore, there is also the possibility of unforeseen or unpredictable risks and/or possible complications that may result in a catastrophic outcome. The patient indicated having understood very clearly. We have given the patient no guarantees and we have made no promises. Enough time was given to the patient to ask questions, all of which were answered to the patient's satisfaction. Ms. Imbrogno has indicated that she wanted to continue with the procedure. Attestation: I, the ordering provider, attest that I have discussed with the patient the benefits, risks, side-effects, alternatives, likelihood of achieving goals, and potential problems during recovery for the procedure that I have provided informed consent. Date  Time: 09/13/2022  1:02 PM  Pre-Procedure Preparation:  Monitoring: As per clinic protocol. Respiration, ETCO2, SpO2, BP, heart rate and rhythm monitor placed and checked for adequate function Safety Precautions: Patient was assessed for positional comfort and pressure points before starting the procedure. Time-out: I initiated and conducted the "Time-out" before starting the procedure, as per protocol. The patient was asked to participate by confirming the accuracy of the "Time Out" information. Verification of the correct person, site, and procedure were performed and confirmed by me, the nursing staff, and the patient. "Time-out" conducted as per Joint Commission's Universal Protocol (UP.01.01.01). Time: 1318 Start Time: 1318 hrs.  Description/Narrative of Procedure:          Region: Distal lower extremity Target Area: Sensory peripheral nerves affected by diabetic peripheral neuropathy Site: Feet Approach: Percutaneous  No./Series: Not applicable  Type: Percutaneous  Purpose: Therapeutic  Region:  Distal lower extremities  Start Time: 1318 hrs.  Description of the Procedure: Protocol guidelines were followed. The patient was assisted into a comfortable position.  Informed consent was obtained in the patient monitored in the usual manner.  All questions were answered prior to the procedure.  They Qutenza patches were applied to the affected area and then covered with the wrap.  The Patient was kept under observation until the treatment was completed.  The patches were removed and the treated area was inspected.  Vitals:   09/13/22 1307 09/13/22 1401  BP: (!) 118/58 112/74  Pulse: 77 72  Resp: 16 16  Temp: (!)  97.5 F (36.4 C) (!) 97.5 F (36.4 C)  SpO2: 92% 92%  Weight: 192 lb (87.1 kg)   Height: 5\' 6"  (1.676 m)      End Time: 1359 hrs.  Imaging Guidance:          Type of Imaging Technique: None used Indication(s): N/A Exposure Time: No patient exposure Contrast: None used. Fluoroscopic Guidance: N/A Ultrasound Guidance: N/A Interpretation: N/A  Post-operative Assessment:  Post-procedure Vital Signs:  Pulse/HCG Rate: 72  Temp: (!) 97.5 F (36.4 C) Resp: 16 BP: 112/74 SpO2: 92 %  EBL: None  Complications: No immediate post-treatment complications observed by team, or reported by patient.  Note: The patient tolerated the entire procedure well. A repeat set of vitals were taken after the procedure and the patient was kept under observation following institutional policy, for this type of procedure. Post-procedural neurological assessment was performed, showing return to baseline, prior to discharge. The patient was provided with post-procedure discharge instructions, including a section on how to identify potential problems. Should any problems arise concerning this procedure, the patient was given instructions to immediately contact us, at any time, without hesitation. In any case, we plan to contact the patient by telephone for a follow-up status report regarding this  interventional procedure.  Comments:  No additional relevant information.  Plan of Care (POC)  Orders:  No orders of the defined types were placed in this encounter.  Chronic Opioid Analgesic:  Tramadol 50 mg twice daily as needed, quantity 60/month    Medications ordered for procedure: Meds ordered this encounter  Medications   capsaicin topical system 8 % patch 4 patch   Medications administered: We administered capsaicin topical system.  See the medical record for exact dosing, route, and time of administration.  Follow-up plan:   Return for Keep sch. appt.       Recent Visits Date Type Provider Dept  07/27/22 Office Visit Edward Jolly, MD [facility]  Showing recent visits within past 90 days and meeting all other requirements Today's Visits Date Type Provider Dept  09/13/22 Procedure visit Edward Jolly, MD [facility]  Showing today's visits and meeting all other requirements Future Appointments Date Type Provider Dept  10/19/22 Appointment Edward Jolly, MD [facility]  Showing future appointments within next 90 days and meeting all other requirements  Disposition: Discharge home  Discharge (Date  Time): 09/13/2022; 1406 hrs.   Primary Care Physician: Jacky Kindle, FNP Location: [facility] Note by: Edward Jolly, MD (TTS technology used. I apologize for any typographical errors that were not detected and corrected.) Date: 09/13/2022; Time: 2:56 PM  Disclaimer:  Medicine is not an Visual merchandiser. The only guarantee in medicine is that nothing is guaranteed. It is important to note that the decision to proceed with this intervention was based on the information collected from the patient. The Data and conclusions were drawn from the patient's questionnaire, the interview, and the physical examination. Because the information was provided in large part by the patient, it cannot be guaranteed that it has not  been purposely or unconsciously manipulated. Every effort has been made to obtain as much relevant data as possible for this evaluation. It is important to note that the conclusions that lead to this procedure are derived in large part from the available data. Always take into account that the treatment will also be dependent on availability of resources and existing treatment guidelines, considered by other Pain Management Practitioners as being common  knowledge and practice, at the time of the intervention. For Medico-Legal purposes, it is also important to point out that variation in procedural techniques and pharmacological choices are the acceptable norm. The indications, contraindications, technique, and results of the above procedure should only be interpreted and judged by a Board-Certified Interventional Pain Specialist with extensive familiarity and expertise in the same exact procedure and technique.

## 2022-09-13 NOTE — Progress Notes (Signed)
Safety precautions to be maintained throughout the outpatient stay will include: orient to surroundings, keep bed in low position, maintain call bell within reach at all times, provide assistance with transfer out of bed and ambulation.  

## 2022-09-13 NOTE — Patient Instructions (Signed)
F/U at next med mgmnt appt.

## 2022-09-14 ENCOUNTER — Telehealth: Payer: Self-pay

## 2022-09-14 DIAGNOSIS — N186 End stage renal disease: Secondary | ICD-10-CM | POA: Diagnosis not present

## 2022-09-14 DIAGNOSIS — D509 Iron deficiency anemia, unspecified: Secondary | ICD-10-CM | POA: Diagnosis not present

## 2022-09-14 DIAGNOSIS — N250 Renal osteodystrophy: Secondary | ICD-10-CM | POA: Diagnosis not present

## 2022-09-14 DIAGNOSIS — D631 Anemia in chronic kidney disease: Secondary | ICD-10-CM | POA: Diagnosis not present

## 2022-09-14 DIAGNOSIS — N2581 Secondary hyperparathyroidism of renal origin: Secondary | ICD-10-CM | POA: Diagnosis not present

## 2022-09-14 DIAGNOSIS — Z992 Dependence on renal dialysis: Secondary | ICD-10-CM | POA: Diagnosis not present

## 2022-09-14 NOTE — Telephone Encounter (Signed)
Post procedure follow up.  Patinet states she is doing good.  

## 2022-09-16 ENCOUNTER — Inpatient Hospital Stay: Payer: Medicare Other | Attending: Oncology

## 2022-09-16 DIAGNOSIS — Z87891 Personal history of nicotine dependence: Secondary | ICD-10-CM | POA: Insufficient documentation

## 2022-09-16 DIAGNOSIS — N2581 Secondary hyperparathyroidism of renal origin: Secondary | ICD-10-CM | POA: Diagnosis not present

## 2022-09-16 DIAGNOSIS — D75839 Thrombocytosis, unspecified: Secondary | ICD-10-CM | POA: Diagnosis not present

## 2022-09-16 DIAGNOSIS — D473 Essential (hemorrhagic) thrombocythemia: Secondary | ICD-10-CM | POA: Insufficient documentation

## 2022-09-16 DIAGNOSIS — D72829 Elevated white blood cell count, unspecified: Secondary | ICD-10-CM | POA: Insufficient documentation

## 2022-09-16 DIAGNOSIS — N250 Renal osteodystrophy: Secondary | ICD-10-CM | POA: Diagnosis not present

## 2022-09-16 DIAGNOSIS — Z79899 Other long term (current) drug therapy: Secondary | ICD-10-CM | POA: Insufficient documentation

## 2022-09-16 DIAGNOSIS — N189 Chronic kidney disease, unspecified: Secondary | ICD-10-CM | POA: Insufficient documentation

## 2022-09-16 DIAGNOSIS — D631 Anemia in chronic kidney disease: Secondary | ICD-10-CM | POA: Diagnosis not present

## 2022-09-16 DIAGNOSIS — D500 Iron deficiency anemia secondary to blood loss (chronic): Secondary | ICD-10-CM

## 2022-09-16 DIAGNOSIS — Z992 Dependence on renal dialysis: Secondary | ICD-10-CM | POA: Diagnosis not present

## 2022-09-16 DIAGNOSIS — N186 End stage renal disease: Secondary | ICD-10-CM | POA: Diagnosis not present

## 2022-09-16 DIAGNOSIS — D509 Iron deficiency anemia, unspecified: Secondary | ICD-10-CM | POA: Insufficient documentation

## 2022-09-16 LAB — CBC WITH DIFFERENTIAL/PLATELET
Abs Immature Granulocytes: 0.04 10*3/uL (ref 0.00–0.07)
Basophils Absolute: 0.1 10*3/uL (ref 0.0–0.1)
Basophils Relative: 1 %
Eosinophils Absolute: 0.3 10*3/uL (ref 0.0–0.5)
Eosinophils Relative: 2 %
HCT: 40.1 % (ref 36.0–46.0)
Hemoglobin: 11.6 g/dL — ABNORMAL LOW (ref 12.0–15.0)
Immature Granulocytes: 0 %
Lymphocytes Relative: 10 %
Lymphs Abs: 1.2 10*3/uL (ref 0.7–4.0)
MCH: 25.1 pg — ABNORMAL LOW (ref 26.0–34.0)
MCHC: 28.9 g/dL — ABNORMAL LOW (ref 30.0–36.0)
MCV: 86.8 fL (ref 80.0–100.0)
Monocytes Absolute: 0.8 10*3/uL (ref 0.1–1.0)
Monocytes Relative: 7 %
Neutro Abs: 9.1 10*3/uL — ABNORMAL HIGH (ref 1.7–7.7)
Neutrophils Relative %: 80 %
Platelets: 529 10*3/uL — ABNORMAL HIGH (ref 150–400)
RBC: 4.62 MIL/uL (ref 3.87–5.11)
RDW: 21.3 % — ABNORMAL HIGH (ref 11.5–15.5)
WBC: 11.5 10*3/uL — ABNORMAL HIGH (ref 4.0–10.5)
nRBC: 0 % (ref 0.0–0.2)

## 2022-09-16 LAB — SAMPLE TO BLOOD BANK

## 2022-09-18 DIAGNOSIS — Z992 Dependence on renal dialysis: Secondary | ICD-10-CM | POA: Diagnosis not present

## 2022-09-18 DIAGNOSIS — N250 Renal osteodystrophy: Secondary | ICD-10-CM | POA: Diagnosis not present

## 2022-09-18 DIAGNOSIS — D631 Anemia in chronic kidney disease: Secondary | ICD-10-CM | POA: Diagnosis not present

## 2022-09-18 DIAGNOSIS — D509 Iron deficiency anemia, unspecified: Secondary | ICD-10-CM | POA: Diagnosis not present

## 2022-09-18 DIAGNOSIS — N186 End stage renal disease: Secondary | ICD-10-CM | POA: Diagnosis not present

## 2022-09-18 DIAGNOSIS — N2581 Secondary hyperparathyroidism of renal origin: Secondary | ICD-10-CM | POA: Diagnosis not present

## 2022-09-20 ENCOUNTER — Ambulatory Visit: Payer: Medicare Other | Attending: Cardiovascular Disease

## 2022-09-20 DIAGNOSIS — Z01810 Encounter for preprocedural cardiovascular examination: Secondary | ICD-10-CM | POA: Diagnosis not present

## 2022-09-20 NOTE — Progress Notes (Signed)
Virtual Visit via Telephone Note   Because of Jocelyn Wells's co-morbid illnesses, she is at least at moderate risk for complications without adequate follow up.  This format is felt to be most appropriate for this patient at this time.  The patient did not have access to video technology/had technical difficulties with video requiring transitioning to audio format only (telephone).  All issues noted in this document were discussed and addressed.  No physical exam could be performed with this format.  Please refer to the patient's chart for her consent to telehealth for [employer].  Evaluation Performed:  Preoperative cardiovascular risk assessment _____________   Date:  09/20/2022   Patient ID:  Jocelyn Wells, DOB [birth year], MRN [id number] Patient Location:  Home Provider location:   Office  Primary Care Provider:  Jacky Kindle, FNP Primary Cardiologist:  None  Chief Complaint / Patient Profile   67 y.o. y/o female with a h/o diabetes mellitus, HTN, CAD, stroke, anemia, anxiety, COPD, depression, leukocytosis, tobacco use and chronic heart failure, dialysis patient who is pending EGD and presents today for telephonic preoperative cardiovascular risk assessment.  History of Present Illness    Jocelyn Wells is a 67 y.o. female who presents via audio/video conferencing for a telehealth visit today.  Pt was last seen in cardiology clinic on 08/24/2022 by Clarisa Kindred, FNP.  At that time Jocelyn Wells was doing well other than a productive cough.  The patient is now pending procedure as outlined above. Since her last visit, she tells me she has not had any new cardiovascular symptoms.  No chest pain or shortness of breath.  She does have leg weakness which limits her from walking up a flight of stairs and walking 1-2 blocks.  This is nothing new.  She had an echocardiogram about a month ago which showed LVEF 50 to 5% which is improved.  She is seen by the heart failure  clinic.   Eliquis are managed by PCP. Defer to them for recommendations on holding.   Past Medical History    Past Medical History:  Diagnosis Date   Acute ischemic left MCA stroke (HCC) 01/29/2015   a.) MRI brain 01/29/2015: acute left MCA territory infarcts involving the posterior left frontal lobe and left insular cortex   Anemia of chronic renal failure    Anxiety    Arthritis    Complication of anesthesia    a.) PONV; b.) awareness under anesthesia   COPD (chronic obstructive pulmonary disease) (HCC)    Depression    Dyspnea    ESRD (end stage renal disease) (HCC)    Headache    HFrEF (heart failure with reduced ejection fraction) (HCC)    a.) TTE 01/30/2015: EF 30-35%, diff HK, LAE, G2DD; b.) TTE 06/13/2015: EF 40-45%, diff HK, G1DD; c.) TTE 10/06/2017: EF 60-65% MAC, no IAS; d.) TTE 09/29/2021: EF 45-50%;, LVH, LAE, AoV sclerosis, G1DD; e.) TTE 02/24/2022: EF 60-65%, no IAS   HTN (hypertension)    Leukocytosis    Long term current use of anticoagulant    a.) apixaban   Long term current use of antithrombotics/antiplatelets    a.) clopidogrel   NICM (nonischemic cardiomyopathy) (HCC)    a.) TTE 01/30/2015: EF 30-35%; b.) R/LHC 03/25/2015: EF 25-30%, mRA 13, mPA 30, mPCWP 26, LVEDP 17, CO 3.66, CI 1.8; c.) TTE 06/13/2015: EF 40-45%; d.) TTE 10/06/2017: EF 60-65%; e.) TTE 09/29/2021: EF 45-50%; f.) TTE 02/24/2022: EF 60-65%   Obesity  On supplemental oxygen by nasal cannula    a.) 2L/[place] at night   Osteoporosis    Peripheral vascular disease (HCC)    Type 2 diabetes mellitus treated with insulin [facility])    Past Surgical History:  Procedure Laterality Date   ABDOMINAL HYSTERECTOMY     ANKLE FRACTURE SURGERY Left 02/09/2000   AV FISTULA PLACEMENT Left 06/18/2022   Procedure: ARTERIOVENOUS (AV) FISTULA CREATION (BRACHIAL AXILLA);  Surgeon: Renford Dills, MD;  Location: [facility] ORS;  Service: Vascular;  Laterality: Left;   BILATERAL SALPINGOOPHORECTOMY  02/08/1998    CARDIAC CATHETERIZATION N/A 03/25/2015   Procedure: Right/Left Heart Cath and Coronary Angiography;  Surgeon: Lyn Records, MD;  Location: [facility] INVASIVE CV LAB;  Service: Cardiovascular;  Laterality: N/A;   CESAREAN SECTION  02/09/1976   COLONOSCOPY WITH PROPOFOL N/A 09/22/2016   Procedure: COLONOSCOPY WITH PROPOFOL;  Surgeon: Scot Jun, MD;  Location: [facility] ENDOSCOPY;  Service: Endoscopy;  Laterality: N/A;   COLONOSCOPY WITH PROPOFOL N/A 02/15/2022   Procedure: COLONOSCOPY WITH PROPOFOL;  Surgeon: Toney Reil, MD;  Location: [facility] ENDOSCOPY;  Service: Gastroenterology;  Laterality: N/A;   EP IMPLANTABLE DEVICE N/A 08/05/2015   Procedure: Loop Recorder Insertion;  Surgeon: Duke Salvia, MD;  Location: [facility] INVASIVE CV LAB;  Service: Cardiovascular;  Laterality: N/A;   ESOPHAGOGASTRODUODENOSCOPY N/A 02/15/2022   Procedure: ESOPHAGOGASTRODUODENOSCOPY (EGD);  Surgeon: Toney Reil, MD;  Location: [facility] ENDOSCOPY;  Service: Gastroenterology;  Laterality: N/A;   ESOPHAGOGASTRODUODENOSCOPY (EGD) WITH PROPOFOL N/A 09/22/2016   Procedure: ESOPHAGOGASTRODUODENOSCOPY (EGD) WITH PROPOFOL;  Surgeon: Scot Jun, MD;  Location: [facility] ENDOSCOPY;  Service: Endoscopy;  Laterality: N/A;   ESOPHAGOGASTRODUODENOSCOPY (EGD) WITH PROPOFOL N/A 12/08/2016   Procedure: ESOPHAGOGASTRODUODENOSCOPY (EGD) WITH PROPOFOL;  Surgeon: Scot Jun, MD;  Location: [facility] ENDOSCOPY;  Service: Endoscopy;  Laterality: N/A;   FRACTURE SURGERY     GIVENS CAPSULE STUDY N/A 04/19/2022   Procedure: GIVENS CAPSULE STUDY;  Surgeon: Toney Reil, MD;  Location: [facility] ENDOSCOPY;  Service: Gastroenterology;  Laterality: N/A;   INSERTION HYBRID ANTERIOVENOUS GORTEX GRAFT  06/18/2022   Procedure: INSERTION HYBRID ANTERIOVENOUS GORTEX GRAFT;  Surgeon: Renford Dills, MD;  Location: [facility] ORS;  Service: Vascular;;   KNEE ARTHROSCOPY Left 02/09/2003   LOWER EXTREMITY ANGIOGRAPHY Left 08/12/2021   Procedure: Lower  Extremity Angiography;  Surgeon: Renford Dills, MD;  Location: [facility] INVASIVE CV LAB;  Service: Cardiovascular;  Laterality: Left;   TEE WITHOUT CARDIOVERSION N/A 01/31/2015   Procedure: TRANSESOPHAGEAL ECHOCARDIOGRAM (TEE);  Surgeon: Lewayne Bunting, MD;  Location: [facility] ENDOSCOPY;  Service: Cardiovascular;  Laterality: N/A;   TEMPORARY DIALYSIS CATHETER N/A 08/14/2021   Procedure: TEMPORARY DIALYSIS CATHETER;  Surgeon: Renford Dills, MD;  Location: [facility] INVASIVE CV LAB;  Service: Cardiovascular;  Laterality: N/A;   TUBAL LIGATION  02/09/1976   ULNAR NERVE TRANSPOSITION  02/08/2006    Allergies  Allergies  Allergen Reactions   Codeine Nausea And Vomiting   Ivp Dye [Iodinated Contrast Media]     Patients states the IV Dye shuts her kidneys down    Home Medications    Prior to Admission medications   Medication Sig Start Date End Date Taking? Authorizing Provider  albuterol (VENTOLIN HFA) 108 (90 Base) MCG/ACT inhaler TAKE 2 PUFFS BY MOUTH EVERY 6 HOURS AS NEEDED FOR WHEEZE OR SHORTNESS OF BREATH Patient taking differently: Inhale 2 puffs into the lungs every 6 (six) hours as needed for wheezing or shortness of breath. 06/19/21   Jacky Kindle, FNP  apixaban (ELIQUIS) 5 MG TABS tablet Take 1 tablet (5 mg total) by mouth 2 (two) times daily. 02/11/22   Charlsie Quest, NP  atorvastatin (LIPITOR) 40 MG tablet Take 1 tablet (40 mg total) by mouth daily. 09/08/21   Jacky Kindle, FNP  buPROPion [facility] SR) 150 MG 12 hr tablet TAKE 1 TABLET BY MOUTH TWICE A DAY 09/06/22   Merita Norton T, FNP  calcitRIOL (ROCALTROL) 0.25 MCG capsule Take 0.25 mcg by mouth daily. 03/15/22   [provider]  carvedilol (COREG) 6.25 MG tablet Take 1 tablet (6.25 mg total) by mouth 2 (two) times daily. 08/19/22   Delma Freeze, FNP  Dulaglutide (TRULICITY) 4.5 MG/0.5ML SOPN Inject 4.5 mg into the skin every 7 (seven) days. Patient taking differently: Inject 4.5 mg into the skin every 7 (seven) days.  Mondays 07/12/22     fluticasone (FLONASE) 50 MCG/ACT nasal spray Place 2 sprays into both nostrils daily. Patient taking differently: Place 2 sprays into both nostrils as needed. 02/13/21   Jacky Kindle, FNP  gabapentin (NEURONTIN) 300 MG capsule Take 300 mg by mouth daily.    [provider]  glucose blood test strip     [provider]  insulin aspart (NOVOLOG) 100 UNIT/ML injection Insulin pump 05/26/20   [provider]  Insulin Disposable Pump (OMNIPOD DASH PODS, GEN 4,) MISC USE 1 POD EACH EVERY 72    HOURS 10/29/20   [provider]  Insulin Human (INSULIN PUMP) SOLN Per endocrinoloy 06/01/20   Elgergawy, Leana Roe, MD  loratadine (CLARITIN) 10 MG tablet Take 1 tablet by mouth daily.    [provider]  meclizine (ANTIVERT) 12.5 MG tablet Take 1 tablet (12.5 mg total) by mouth 3 (three) times daily as needed for dizziness. 11/23/21   Caro Laroche, DO  OXYGEN Place 2 L into the nose continuous.    [provider]  PARoxetine (PAXIL) 40 MG tablet Take 1 tablet (40 mg total) by mouth daily. 04/10/22 10/07/22  Neysa Hotter, MD  Spacer/Aero-Holding Chambers [facility] DIAMOND) MISC Use with inhaler to ensure medication is delivered throughout lungs 02/13/21   Merita Norton T, FNP  torsemide (DEMADEX) 20 MG tablet Take 1 tablet (20 mg) by mouth once daily 07/09/22   Debbe Odea, MD  traMADol (ULTRAM) 50 MG tablet Take 1 tablet (50 mg total) by mouth every 12 (twelve) hours as needed for severe pain. 07/28/22 10/26/22  Edward Jolly, MD  Vitamin D, Ergocalciferol, (DRISDOL) 1.25 MG (50000 UNIT) CAPS capsule Take 50,000 Units by mouth every 7 (seven) days. Sunday 01/24/22   [provider]    Physical Exam    Vital Signs:  Zyla A Lesure does not have vital signs available for review today.  Given telephonic nature of communication, physical exam is limited. AAOx3. NAD. Normal affect.  Speech and respirations are  unlabored.  Accessory Clinical Findings    None  Assessment & Plan    1.  Preoperative Cardiovascular Risk Assessment:  Ms. Callis perioperative risk of a major cardiac event is 11% according to the Revised Cardiac Risk Index (RCRI).  Therefore, she is at high risk for perioperative complications.   Her functional capacity is poor at 3.63 METs according to the [employer] Activity Status Index (DASI). Recommendations: The patient is at high risk for perioperative cardiac complications and is at a low functional capacity.  However, further testing will not change how her cardiac status is managed.  Proceed with surgery at high risk  if there are no other options for treatment. Antiplatelet and/or Anticoagulation Recommendations: Eliquis holding parameters will come from PCP.    The patient was advised that if she develops new symptoms prior to surgery to contact our office to arrange for a follow-up visit, and she verbalized understanding.  A copy of this note will be routed to requesting surgeon.  Time:   Today, I have spent 8 minutes with the patient with telehealth technology discussing medical history, symptoms, and management plan.     Sharlene Dory, PA-C  09/20/2022, 10:23 AM

## 2022-09-20 NOTE — Progress Notes (Signed)
THERAPIST PROGRESS NOTE  Session Time: 1:10PM-1:32PM  Participation Level: Active  Behavioral Response: Casual and Well GroomedAlertDepressed  Type of Therapy: Individual Therapy  Treatment Goals addressed:  Reduce overall frequency, intensity and duration of depression so that daily functioning is not impaired per pt self report 3 out of 5 sessions documented.   Reduce overall frequency, intensity and duration of anxiety so that daily functioning is not impaired per pt self report 3 out of 5 sessions.   Increase coping skills to manage depression and improve ability to perform daily activities  ProgressTowards Goals: Progressing  Interventions: CBT, DBT, Solution Focused, Strength-based, and Other: ACT  Summary: Jocelyn Wells is a 67 y.o. female who presents with mixed sxs of anxiety and depression related to a hx of trauma and current stressors. Sxs endorsed including but not limited to lack of motivation, fatigue, depressed mood, flashbacks, worry, difficulty controlling worry, irritability, and isolation. Pt oriented to person, place, and time. Pt denies SI/HI or A/V hallucinations. Pt was cooperative during visit and was engaged throughout the visit. Pt does not report any other concerns at the time of visit. This information has been reviewed and remains consistent with pt experience. This information has been reviewed and continues to be accurate to pt experience.   Patient reported that she is preparing to have surgery on Friday to get her fistula in her arm in case she needs dialysis in the future. Patient utilized therapeutic space to process emotions surrounding procedure. Patient identified support system who will be helping her going forth including her son.   Patient reported that she is working to get out of the house more and had fun over the weekend at a [employer]. Patient reported that this did improve her mood and reported desire to continue to get out of the  house more as she can. Patient reported that she feels she is overall progressing in the direction she wants to and would like to scale back therapy to once a month.   LCSW provided mood monitoring and treatment progress review in the context of this episode of treatment. LCSW reviewed the pt's mood status since last session.   Pt is continuing to apply interventions/techniques learned in session into daily life situations. Pt is currently on track to meet goals utilizing interventions that are discussed in session. Treatment to continue as indicated. Personal growth and progress toward goals noted above.  Continued Recommendations as followed: Self-care behaviors, positive social engagements, focusing on positive physical and emotional wellness, and focusing on life/work balance.    Virtual Visit via Video Note  I connected with Jocelyn Wells on 06/16/2022 at  1:00 PM EDT by a video enabled telemedicine application and verified that I am speaking with the correct person using two identifiers.  Location: Patient: located in pt's home Provider: [facility]   I discussed the limitations of evaluation and management by telemedicine and the availability of in person appointments. The patient expressed understanding and agreed to proceed.   I discussed the assessment and treatment plan with the patient. The patient was provided an opportunity to ask questions and all were answered. The patient agreed with the plan and demonstrated an understanding of the instructions.   The patient was advised to call back or seek an in-person evaluation if the symptoms worsen or if the condition fails to improve as anticipated.  I provided 22 minutes of non-face-to-face time during this encounter.   Memory Dance , LCSW   Suicidal/Homicidal: Nowithout  intent/plan  Therapist Response:  Provided pt education re: acceptance. Discussed how to make acceptance accessible at all parts of pt's healing journey.   LCSW  practiced active listening to validate pt participation, build rapport, and create safe space for pt to feel heard as they are disclosing their thoughts and feelings.   Approached pt with strengths based perspective to assist pt in exploring strengths in moments of feeling low.   LCSW utilized therapeutic conversation skills informed by CBT, DBT, and ACT to expose pt to multiple ways of thinking about healing and to provide pt to access to multiple interventions.  Plan: Return again in 4 weeks.  Diagnosis: PTSD (post-traumatic stress disorder)  MDD (major depressive disorder), recurrent, in partial remission (HCC)     06/16/2022    1:24 PM 06/01/2022   10:48 AM 05/19/2022    1:17 PM  Depression screen PHQ 2/9  Decreased Interest 0 0 0  Down, Depressed, Hopeless 1 1 1   PHQ - 2 Score 1 1 1   Altered sleeping 1 1   Tired, decreased energy 1 3   Feeling bad or failure about yourself  0 1   Trouble concentrating 1 0   Moving slowly or fidgety/restless 0 1   Suicidal thoughts 0 0   PHQ-9 Score 4 7   Difficult doing work/chores  Not difficult at all        06/16/2022    1:25 PM 05/19/2022    1:18 PM 02/17/2022    2:54 PM  GAD 7 : Generalized Anxiety Score  Nervous, Anxious, on Edge 1 1 1   Control/stop worrying 0 1 0  Worry too much - different things 0 1 1  Trouble relaxing 0 1 1  Restless 0 0 1  Easily annoyed or irritable 0 1 1  Afraid - awful might happen 1 1 1   Total GAD 7 Score 2 6 6   Anxiety Difficulty   Somewhat difficult      Collaboration of Care: Psychiatrist AEB Dr. Vanetta Shawl  Patient/Guardian was advised Release of Information must be obtained prior to any record release in order to collaborate their care with an outside provider. Patient/Guardian was advised if they have not already done so to contact the registration department to sign all necessary forms in order for [place] to release information regarding their care.   Consent: Patient/Guardian gives verbal consent for  treatment and assignment of benefits for services provided during this visit. Patient/Guardian expressed understanding and agreed to proceed.   Geoffry Paradise, LCSW 09/20/2022

## 2022-09-21 DIAGNOSIS — D509 Iron deficiency anemia, unspecified: Secondary | ICD-10-CM | POA: Diagnosis not present

## 2022-09-21 DIAGNOSIS — N2581 Secondary hyperparathyroidism of renal origin: Secondary | ICD-10-CM | POA: Diagnosis not present

## 2022-09-21 DIAGNOSIS — N250 Renal osteodystrophy: Secondary | ICD-10-CM | POA: Diagnosis not present

## 2022-09-21 DIAGNOSIS — Z992 Dependence on renal dialysis: Secondary | ICD-10-CM | POA: Diagnosis not present

## 2022-09-21 DIAGNOSIS — N186 End stage renal disease: Secondary | ICD-10-CM | POA: Diagnosis not present

## 2022-09-21 DIAGNOSIS — D631 Anemia in chronic kidney disease: Secondary | ICD-10-CM | POA: Diagnosis not present

## 2022-09-22 ENCOUNTER — Ambulatory Visit: Payer: Medicare Other | Admitting: Gastroenterology

## 2022-09-23 DIAGNOSIS — Z992 Dependence on renal dialysis: Secondary | ICD-10-CM | POA: Diagnosis not present

## 2022-09-23 DIAGNOSIS — N186 End stage renal disease: Secondary | ICD-10-CM | POA: Diagnosis not present

## 2022-09-23 DIAGNOSIS — D631 Anemia in chronic kidney disease: Secondary | ICD-10-CM | POA: Diagnosis not present

## 2022-09-23 DIAGNOSIS — N250 Renal osteodystrophy: Secondary | ICD-10-CM | POA: Diagnosis not present

## 2022-09-23 DIAGNOSIS — D509 Iron deficiency anemia, unspecified: Secondary | ICD-10-CM | POA: Diagnosis not present

## 2022-09-23 DIAGNOSIS — N2581 Secondary hyperparathyroidism of renal origin: Secondary | ICD-10-CM | POA: Diagnosis not present

## 2022-09-23 NOTE — Progress Notes (Unsigned)
BH MD/PA/NP OP Progress Note  09/23/2022 1:06 PM Jocelyn Wells  MRN:  [id number]  Chief Complaint: No chief complaint on file.  HPI: ***  She is not seen since Feb  Visit Diagnosis: No diagnosis found.  Past Psychiatric History: Please see initial evaluation for full details. I have reviewed the history. No updates at this time.     Past Medical History:  Past Medical History:  Diagnosis Date   Acute ischemic left MCA stroke (HCC) 01/29/2015   a.) MRI brain 01/29/2015: acute left MCA territory infarcts involving the posterior left frontal lobe and left insular cortex   Anemia of chronic renal failure    Anxiety    Arthritis    Complication of anesthesia    a.) PONV; b.) awareness under anesthesia   COPD (chronic obstructive pulmonary disease) (HCC)    Depression    Dyspnea    ESRD (end stage renal disease) (HCC)    Headache    HFrEF (heart failure with reduced ejection fraction) (HCC)    a.) TTE 01/30/2015: EF 30-35%, diff HK, LAE, G2DD; b.) TTE 06/13/2015: EF 40-45%, diff HK, G1DD; c.) TTE 10/06/2017: EF 60-65% MAC, no IAS; d.) TTE 09/29/2021: EF 45-50%;, LVH, LAE, AoV sclerosis, G1DD; e.) TTE 02/24/2022: EF 60-65%, no IAS   HTN (hypertension)    Leukocytosis    Long term current use of anticoagulant    a.) apixaban   Long term current use of antithrombotics/antiplatelets    a.) clopidogrel   NICM (nonischemic cardiomyopathy) (HCC)    a.) TTE 01/30/2015: EF 30-35%; b.) R/LHC 03/25/2015: EF 25-30%, mRA 13, mPA 30, mPCWP 26, LVEDP 17, CO 3.66, CI 1.8; c.) TTE 06/13/2015: EF 40-45%; d.) TTE 10/06/2017: EF 60-65%; e.) TTE 09/29/2021: EF 45-50%; f.) TTE 02/24/2022: EF 60-65%   Obesity    On supplemental oxygen by nasal cannula    a.) 2L/[place] at night   Osteoporosis    Peripheral vascular disease (HCC)    Type 2 diabetes mellitus treated with insulin [facility])     Past Surgical History:  Procedure Laterality Date   ABDOMINAL HYSTERECTOMY     ANKLE FRACTURE SURGERY Left  02/09/2000   AV FISTULA PLACEMENT Left 06/18/2022   Procedure: ARTERIOVENOUS (AV) FISTULA CREATION (BRACHIAL AXILLA);  Surgeon: Renford Dills, MD;  Location: [facility] ORS;  Service: Vascular;  Laterality: Left;   BILATERAL SALPINGOOPHORECTOMY  02/08/1998   CARDIAC CATHETERIZATION N/A 03/25/2015   Procedure: Right/Left Heart Cath and Coronary Angiography;  Surgeon: Lyn Records, MD;  Location[facility] INVASIVE CV LAB;  Service: Cardiovascular;  Laterality: N/A;   CESAREAN SECTION  02/09/1976   COLONOSCOPY WITH PROPOFOL N/A 09/22/2016   Procedure: COLONOSCOPY WITH PROPOFOL;  Surgeon: Scot Jun, MD;  Location: [facility] ENDOSCOPY;  Service: Endoscopy;  Laterality: N/A;   COLONOSCOPY WITH PROPOFOL N/A 02/15/2022   Procedure: COLONOSCOPY WITH PROPOFOL;  Surgeon: Toney Reil, MD;  Location: [facility] ENDOSCOPY;  Service: Gastroenterology;  Laterality: N/A;   EP IMPLANTABLE DEVICE N/A 08/05/2015   Procedure: Loop Recorder Insertion;  Surgeon: Duke Salvia, MD;  Location: [facility] INVASIVE CV LAB;  Service: Cardiovascular;  Laterality: N/A;   ESOPHAGOGASTRODUODENOSCOPY N/A 02/15/2022   Procedure: ESOPHAGOGASTRODUODENOSCOPY (EGD);  Surgeon: Toney Reil, MD;  Location: [facility] ENDOSCOPY;  Service: Gastroenterology;  Laterality: N/A;   ESOPHAGOGASTRODUODENOSCOPY (EGD) WITH PROPOFOL N/A 09/22/2016   Procedure: ESOPHAGOGASTRODUODENOSCOPY (EGD) WITH PROPOFOL;  Surgeon: Scot Jun, MD;  Location: [facility] ENDOSCOPY;  Service: Endoscopy;  Laterality: N/A;   ESOPHAGOGASTRODUODENOSCOPY (EGD) WITH PROPOFOL  N/A 12/08/2016   Procedure: ESOPHAGOGASTRODUODENOSCOPY (EGD) WITH PROPOFOL;  Surgeon: Scot Jun, MD;  Location: [facility] ENDOSCOPY;  Service: Endoscopy;  Laterality: N/A;   FRACTURE SURGERY     GIVENS CAPSULE STUDY N/A 04/19/2022   Procedure: GIVENS CAPSULE STUDY;  Surgeon: Toney Reil, MD;  Location: [facility] ENDOSCOPY;  Service: Gastroenterology;  Laterality: N/A;   INSERTION HYBRID ANTERIOVENOUS  GORTEX GRAFT  06/18/2022   Procedure: INSERTION HYBRID ANTERIOVENOUS GORTEX GRAFT;  Surgeon: Renford Dills, MD;  Location: [facility] ORS;  Service: Vascular;;   KNEE ARTHROSCOPY Left 02/09/2003   LOWER EXTREMITY ANGIOGRAPHY Left 08/12/2021   Procedure: Lower Extremity Angiography;  Surgeon: Renford Dills, MD;  Location: [facility] INVASIVE CV LAB;  Service: Cardiovascular;  Laterality: Left;   TEE WITHOUT CARDIOVERSION N/A 01/31/2015   Procedure: TRANSESOPHAGEAL ECHOCARDIOGRAM (TEE);  Surgeon: Lewayne Bunting, MD;  Location: [facility] ENDOSCOPY;  Service: Cardiovascular;  Laterality: N/A;   TEMPORARY DIALYSIS CATHETER N/A 08/14/2021   Procedure: TEMPORARY DIALYSIS CATHETER;  Surgeon: Renford Dills, MD;  Location: [facility] INVASIVE CV LAB;  Service: Cardiovascular;  Laterality: N/A;   TUBAL LIGATION  02/09/1976   ULNAR NERVE TRANSPOSITION  02/08/2006    Family Psychiatric History: Please see initial evaluation for full details. I have reviewed the history. No updates at this time.    Family History:  Family History  Problem Relation Age of Onset   Heart disease Mother        died from CHF   Asthma Mother    Diabetes Mother    Heart disease Father    Aneurysm Father    COPD Brother    Diabetes Brother    Alcohol abuse Paternal Aunt    Cancer Maternal Grandmother        unknownorigin   Anemia Neg Hx    Arrhythmia Neg Hx    Clotting disorder Neg Hx    Fainting Neg Hx    Heart attack Neg Hx    Heart failure Neg Hx    Hyperlipidemia Neg Hx    Hypertension Neg Hx     Social History:  Social History   Socioeconomic History   Marital status: Married    Spouse name: [name]   Number of children: 2   Years of education: Not on file   Highest education level: Bachelor's degree (e.g., BA, AB, BS)  Occupational History   Occupation: disabled    Comment: retired  Tobacco Use   Smoking status: Former    Current packs/day: 0.00    Average packs/day: 0.5 packs/day for 48.3 years (24.2  ttl pk-yrs)    Types: Cigarettes    Start date: 02/08/1974    Quit date: 05/31/2022    Years since quitting: 0.3   Smokeless tobacco: Never  Vaping Use   Vaping status: Never Used  Substance and Sexual Activity   Alcohol use: No    Alcohol/week: 0.0 standard drinks of alcohol   Drug use: No   Sexual activity: Yes    Birth control/protection: None  Other Topics Concern   Not on file  Social History Narrative   Lives at home with stepson and husband, dogs and cats   Caffeine  Drinks sweet tea.   Right handed.    Social Determinants of Health   Financial Resource Strain: Low Risk  (12/11/2021)   Overall Financial Resource Strain (CARDIA)    Difficulty of Paying Living Expenses: Not very hard  Food Insecurity: No Food Insecurity (01/05/2022)   Hunger Vital Sign  Worried About Programme researcher, broadcasting/film/video in the Last Year: Never true    Ran Out of Food in the Last Year: Never true  Transportation Needs: No Transportation Needs (01/05/2022)   PRAPARE - Administrator, Civil Service (Medical): No    Lack of Transportation (Non-Medical): No  Physical Activity: Inactive (06/01/2022)   Exercise Vital Sign    Days of Exercise per Week: 0 days    Minutes of Exercise per Session: 0 min  Stress: No Stress Concern Present (06/01/2022)   [employer] of Occupational Health - Occupational Stress Questionnaire    Feeling of Stress : Only a little  Social Connections: Moderately Isolated (12/11/2021)   Social Connection and Isolation Panel [NHANES]    Frequency of Communication with Friends and Family: More than three times a week    Frequency of Social Gatherings with Friends and Family: Once a week    Attends Religious Services: Never    Database administrator or Organizations: No    Attends Banker Meetings: Never    Marital Status: Married    Allergies:  Allergies  Allergen Reactions   Codeine Nausea And Vomiting   Ivp Dye [Iodinated Contrast Media]      Patients states the IV Dye shuts her kidneys down    Metabolic Disorder Labs: Lab Results  Component Value Date   HGBA1C 7.8 04/22/2022   MPG 246 05/31/2020   MPG 303.44 10/06/2017   No results found for: "PROLACTIN" Lab Results  Component Value Date   CHOL 107 12/04/2021   TRIG 112 12/04/2021   HDL 36 (L) 12/04/2021   CHOLHDL 3.0 12/04/2021   VLDL 64 (H) 10/06/2017   LDLCALC 50 12/04/2021   LDLCALC 186 (H) 10/20/2020   Lab Results  Component Value Date   TSH 2.977 10/06/2021    Therapeutic Level Labs: No results found for: "LITHIUM" No results found for: "VALPROATE" No results found for: "CBMZ"  Current Medications: Current Outpatient Medications  Medication Sig Dispense Refill   albuterol (VENTOLIN HFA) 108 (90 Base) MCG/ACT inhaler TAKE 2 PUFFS BY MOUTH EVERY 6 HOURS AS NEEDED FOR WHEEZE OR SHORTNESS OF BREATH (Patient taking differently: Inhale 2 puffs into the lungs every 6 (six) hours as needed for wheezing or shortness of breath.) 8.5 each 2   apixaban (ELIQUIS) 5 MG TABS tablet Take 1 tablet (5 mg total) by mouth 2 (two) times daily. 180 tablet 2   atorvastatin (LIPITOR) 40 MG tablet Take 1 tablet (40 mg total) by mouth daily. 90 tablet 3   buPROPion (WELLBUTRIN SR) 150 MG 12 hr tablet TAKE 1 TABLET BY MOUTH TWICE A DAY 180 tablet 0   calcitRIOL (ROCALTROL) 0.25 MCG capsule Take 0.25 mcg by mouth daily.     carvedilol (COREG) 6.25 MG tablet Take 1 tablet (6.25 mg total) by mouth 2 (two) times daily. 180 tablet 3   Dulaglutide (TRULICITY) 4.5 MG/0.5ML SOPN Inject 4.5 mg into the skin every 7 (seven) days. (Patient taking differently: Inject 4.5 mg into the skin every 7 (seven) days. Mondays) 6 mL 3   fluticasone (FLONASE) 50 MCG/ACT nasal spray Place 2 sprays into both nostrils daily. (Patient taking differently: Place 2 sprays into both nostrils as needed.) 16 g 6   gabapentin (NEURONTIN) 300 MG capsule Take 300 mg by mouth daily.     glucose blood test strip       insulin aspart (NOVOLOG) 100 UNIT/ML injection Insulin pump     Insulin Disposable  Pump (OMNIPOD DASH PODS, GEN 4,) MISC USE 1 POD EACH EVERY 72    HOURS     Insulin Human (INSULIN PUMP) SOLN Per endocrinoloy     loratadine (CLARITIN) 10 MG tablet Take 1 tablet by mouth daily.     meclizine (ANTIVERT) 12.5 MG tablet Take 1 tablet (12.5 mg total) by mouth 3 (three) times daily as needed for dizziness. 30 tablet 0   OXYGEN Place 2 L into the nose continuous.     PARoxetine (PAXIL) 40 MG tablet Take 1 tablet (40 mg total) by mouth daily. 90 tablet 1   Spacer/Aero-Holding Chambers (OPTICHAMBER DIAMOND) MISC Use with inhaler to ensure medication is delivered throughout lungs 1 each 0   torsemide (DEMADEX) 20 MG tablet Take 1 tablet (20 mg) by mouth once daily 270 tablet 1   traMADol (ULTRAM) 50 MG tablet Take 1 tablet (50 mg total) by mouth every 12 (twelve) hours as needed for severe pain. 60 tablet 2   Vitamin D, Ergocalciferol, (DRISDOL) 1.25 MG (50000 UNIT) CAPS capsule Take 50,000 Units by mouth every 7 (seven) days. Sunday     No current facility-administered medications for this visit.     Musculoskeletal: Strength & Muscle Tone: within normal limits Gait & Station: normal Patient leans: N/A  Psychiatric Specialty Exam: Review of Systems  There were no vitals taken for this visit.There is no height or weight on file to calculate BMI.  General Appearance: {Appearance:22683}  Eye Contact:  {BHH EYE CONTACT:22684}  Speech:  Clear and Coherent  Volume:  Normal  Mood:  {BHH MOOD:22306}  Affect:  {Affect (PAA):22687}  Thought Process:  Coherent  Orientation:  Full (Time, Place, and Person)  Thought Content: Logical   Suicidal Thoughts:  {ST/HT (PAA):22692}  Homicidal Thoughts:  {ST/HT (PAA):22692}  Memory:  Immediate;   Good  Judgement:  {Judgement (PAA):22694}  Insight:  {Insight (PAA):22695}  Psychomotor Activity:  Normal  Concentration:  Concentration: Good and Attention Span:  Good  Recall:  Good  Fund of Knowledge: Good  Language: Good  Akathisia:  No  Handed:  Right  AIMS (if indicated): not done  Assets:  Communication Skills Desire for Improvement  ADL's:  Intact  Cognition: WNL  Sleep:  {BHH GOOD/FAIR/POOR:22877}   Screenings: GAD-7    Flowsheet Row Video Visit from 06/16/2022 in [facility] Video Visit from 05/19/2022 in [facility] Counselor from 02/17/2022 in [facility]  Total GAD-7 Score 2 6 6       PHQ2-9    Flowsheet Row Video Visit from 06/16/2022 in [facility] Office Visit from 06/01/2022 in [facility] Video Visit from 05/19/2022 in [facility] Counselor from 02/17/2022 in [facility] Office Visit from 01/18/2022 in [facility]  PHQ-2 Total Score 1 1 1 1  0  PHQ-9 Total Score 4 7 -- 4 6      Flowsheet Row Admission (Discharged) from 06/18/2022 in [facility] Pre-Admission Testing 60 from 06/07/2022 in [facility] PRE ADMISSION TESTING Video Visit from 05/19/2022 in [facility]  C-SSRS RISK CATEGORY No Risk No Risk No Risk        Assessment and Plan:  WHITLEIGH FERREIRO is a 67 y.o. year old female with a history of depression, type II DM, hypertension, hyperlipidemia, non ischemic cardiomyopathy,  CKD stage IV, cryptogenic stroke, iron deficiency anemia, COPD, sleep apnea, back pain with T12-L1 compression fracture, T7-8 disc herniation with some spinal cord compression with no clinical myelopathy, who presents for follow up appointment for below.    1. MDD (major depressive disorder), recurrent, in partial remission (HCC) 2. PTSD (post-traumatic stress  disorder) Acute stressors include:  Other stressors include: health related concern, unemployment, chronic pain, stroke    History:  on Paxil 40 mg since seen by Dr. Evelene Croon Although she reports occasional down mood and anxiety, this has been manageable since the last visit.  Will continue current dose of Paxil to target depression and PTSD.  She will greatly benefit from CBT; referral was made previously.    # Insomnia Improving. She reports history of snoring, daytime fatigue and insomnia.  Although she was strongly recommended to do a sleep evaluation, she declined this, referring to the evaluation she had few years ago.  Will continue to monitor.      Plan Continue Paxil 40 mg daily Referral to therapy Next appointment- 6/18 at 10 AM, IP   -on tramadol. Will monitor for serotonin syndrome   The patient demonstrates the following risk factors for suicide: Chronic risk factors for suicide include: psychiatric disorder of depression and chronic pain. Acute risk factors for suicide include: unemployment. Protective factors for this patient include: positive social support, coping skills, and hope for the future. Considering these factors, the overall suicide risk at this point appears to be low. Patient is appropriate for outpatient follow up.   Collaboration of Care: Collaboration of Care: {BH OP Collaboration of Care:21014065}  Patient/Guardian was advised Release of Information must be obtained prior to any record release in order to collaborate their care with an outside provider. Patient/Guardian was advised if they have not already done so to contact the registration department to sign all necessary forms in order for [place] to release information regarding their care.   Consent: Patient/Guardian gives verbal consent for treatment and assignment of benefits for services provided during this visit. Patient/Guardian expressed understanding and agreed to proceed.    Neysa Hotter, MD 09/23/2022,  1:06 PM

## 2022-09-25 ENCOUNTER — Encounter: Payer: Self-pay | Admitting: Oncology

## 2022-09-25 DIAGNOSIS — N186 End stage renal disease: Secondary | ICD-10-CM | POA: Diagnosis not present

## 2022-09-25 DIAGNOSIS — D509 Iron deficiency anemia, unspecified: Secondary | ICD-10-CM | POA: Diagnosis not present

## 2022-09-25 DIAGNOSIS — N250 Renal osteodystrophy: Secondary | ICD-10-CM | POA: Diagnosis not present

## 2022-09-25 DIAGNOSIS — D631 Anemia in chronic kidney disease: Secondary | ICD-10-CM | POA: Diagnosis not present

## 2022-09-25 DIAGNOSIS — Z992 Dependence on renal dialysis: Secondary | ICD-10-CM | POA: Diagnosis not present

## 2022-09-25 DIAGNOSIS — N2581 Secondary hyperparathyroidism of renal origin: Secondary | ICD-10-CM | POA: Diagnosis not present

## 2022-09-27 ENCOUNTER — Ambulatory Visit (INDEPENDENT_AMBULATORY_CARE_PROVIDER_SITE_OTHER): Payer: Federal, State, Local not specified - PPO | Admitting: Psychiatry

## 2022-09-27 DIAGNOSIS — Z91199 Patient's noncompliance with other medical treatment and regimen due to unspecified reason: Secondary | ICD-10-CM

## 2022-09-28 DIAGNOSIS — N250 Renal osteodystrophy: Secondary | ICD-10-CM | POA: Diagnosis not present

## 2022-09-28 DIAGNOSIS — N2581 Secondary hyperparathyroidism of renal origin: Secondary | ICD-10-CM | POA: Diagnosis not present

## 2022-09-28 DIAGNOSIS — D631 Anemia in chronic kidney disease: Secondary | ICD-10-CM | POA: Diagnosis not present

## 2022-09-28 DIAGNOSIS — N186 End stage renal disease: Secondary | ICD-10-CM | POA: Diagnosis not present

## 2022-09-28 DIAGNOSIS — D509 Iron deficiency anemia, unspecified: Secondary | ICD-10-CM | POA: Diagnosis not present

## 2022-09-28 DIAGNOSIS — Z992 Dependence on renal dialysis: Secondary | ICD-10-CM | POA: Diagnosis not present

## 2022-09-30 ENCOUNTER — Inpatient Hospital Stay: Payer: Medicare Other

## 2022-09-30 DIAGNOSIS — D473 Essential (hemorrhagic) thrombocythemia: Secondary | ICD-10-CM | POA: Diagnosis not present

## 2022-09-30 DIAGNOSIS — D509 Iron deficiency anemia, unspecified: Secondary | ICD-10-CM | POA: Diagnosis not present

## 2022-09-30 DIAGNOSIS — N2581 Secondary hyperparathyroidism of renal origin: Secondary | ICD-10-CM | POA: Diagnosis not present

## 2022-09-30 DIAGNOSIS — D72829 Elevated white blood cell count, unspecified: Secondary | ICD-10-CM | POA: Diagnosis not present

## 2022-09-30 DIAGNOSIS — D500 Iron deficiency anemia secondary to blood loss (chronic): Secondary | ICD-10-CM

## 2022-09-30 DIAGNOSIS — N186 End stage renal disease: Secondary | ICD-10-CM | POA: Diagnosis not present

## 2022-09-30 DIAGNOSIS — N250 Renal osteodystrophy: Secondary | ICD-10-CM | POA: Diagnosis not present

## 2022-09-30 DIAGNOSIS — Z992 Dependence on renal dialysis: Secondary | ICD-10-CM | POA: Diagnosis not present

## 2022-09-30 DIAGNOSIS — D631 Anemia in chronic kidney disease: Secondary | ICD-10-CM | POA: Diagnosis not present

## 2022-09-30 DIAGNOSIS — N189 Chronic kidney disease, unspecified: Secondary | ICD-10-CM | POA: Diagnosis not present

## 2022-09-30 DIAGNOSIS — D75839 Thrombocytosis, unspecified: Secondary | ICD-10-CM | POA: Diagnosis not present

## 2022-09-30 LAB — CBC WITH DIFFERENTIAL/PLATELET
Abs Immature Granulocytes: 0.08 10*3/uL — ABNORMAL HIGH (ref 0.00–0.07)
Basophils Absolute: 0.1 10*3/uL (ref 0.0–0.1)
Basophils Relative: 1 %
Eosinophils Absolute: 0.3 10*3/uL (ref 0.0–0.5)
Eosinophils Relative: 2 %
HCT: 40.3 % (ref 36.0–46.0)
Hemoglobin: 11.8 g/dL — ABNORMAL LOW (ref 12.0–15.0)
Immature Granulocytes: 1 %
Lymphocytes Relative: 7 %
Lymphs Abs: 1 10*3/uL (ref 0.7–4.0)
MCH: 25.5 pg — ABNORMAL LOW (ref 26.0–34.0)
MCHC: 29.3 g/dL — ABNORMAL LOW (ref 30.0–36.0)
MCV: 87.2 fL (ref 80.0–100.0)
Monocytes Absolute: 0.8 10*3/uL (ref 0.1–1.0)
Monocytes Relative: 6 %
Neutro Abs: 11.6 10*3/uL — ABNORMAL HIGH (ref 1.7–7.7)
Neutrophils Relative %: 83 %
Platelets: 573 10*3/uL — ABNORMAL HIGH (ref 150–400)
RBC: 4.62 MIL/uL (ref 3.87–5.11)
RDW: 19.8 % — ABNORMAL HIGH (ref 11.5–15.5)
Smear Review: INCREASED
WBC: 13.9 10*3/uL — ABNORMAL HIGH (ref 4.0–10.5)
nRBC: 0 % (ref 0.0–0.2)

## 2022-09-30 LAB — FERRITIN: Ferritin: 51 ng/mL (ref 11–307)

## 2022-09-30 LAB — SAMPLE TO BLOOD BANK

## 2022-09-30 LAB — IRON AND TIBC
Iron: 47 ug/dL (ref 28–170)
Saturation Ratios: 15 % (ref 10.4–31.8)
TIBC: 321 ug/dL (ref 250–450)
UIBC: 274 ug/dL

## 2022-10-01 ENCOUNTER — Ambulatory Visit: Payer: Medicare Other | Attending: Cardiology | Admitting: Cardiology

## 2022-10-01 ENCOUNTER — Encounter: Payer: Self-pay | Admitting: Cardiology

## 2022-10-01 VITALS — BP 88/50 | HR 79 | Ht 66.0 in | Wt 195.8 lb

## 2022-10-01 DIAGNOSIS — D631 Anemia in chronic kidney disease: Secondary | ICD-10-CM | POA: Diagnosis not present

## 2022-10-01 DIAGNOSIS — I959 Hypotension, unspecified: Secondary | ICD-10-CM | POA: Diagnosis not present

## 2022-10-01 DIAGNOSIS — I5032 Chronic diastolic (congestive) heart failure: Secondary | ICD-10-CM | POA: Diagnosis not present

## 2022-10-01 DIAGNOSIS — Z794 Long term (current) use of insulin: Secondary | ICD-10-CM | POA: Diagnosis not present

## 2022-10-01 DIAGNOSIS — J449 Chronic obstructive pulmonary disease, unspecified: Secondary | ICD-10-CM | POA: Diagnosis not present

## 2022-10-01 DIAGNOSIS — I428 Other cardiomyopathies: Secondary | ICD-10-CM | POA: Diagnosis not present

## 2022-10-01 DIAGNOSIS — E785 Hyperlipidemia, unspecified: Secondary | ICD-10-CM | POA: Diagnosis not present

## 2022-10-01 DIAGNOSIS — I739 Peripheral vascular disease, unspecified: Secondary | ICD-10-CM | POA: Insufficient documentation

## 2022-10-01 DIAGNOSIS — E1169 Type 2 diabetes mellitus with other specified complication: Secondary | ICD-10-CM

## 2022-10-01 DIAGNOSIS — N184 Chronic kidney disease, stage 4 (severe): Secondary | ICD-10-CM

## 2022-10-01 NOTE — Patient Instructions (Signed)
Medication Instructions:  Stop taking Coreg *If you need a refill on your cardiac medications before your next appointment, please call your pharmacy*   Lab Work: None ordered If you have labs (blood work) drawn today and your tests are completely normal, you will receive your results only by: MyChart Message (if you have MyChart) OR A paper copy in the mail If you have any lab test that is abnormal or we need to change your treatment, we will call you to review the results.   Testing/Procedures: None ordered    Follow-Up: At [facility], you and your health needs are our priority.  As part of our continuing mission to provide you with exceptional heart care, we have created designated Provider Care Teams.  These Care Teams include your primary Cardiologist (physician) and Advanced Practice Providers (APPs -  Physician Assistants and Nurse Practitioners) who all work together to provide you with the care you need, when you need it.  We recommend signing up for the patient portal called "MyChart".  Sign up information is provided on this After Visit Summary.  MyChart is used to connect with patients for Virtual Visits (Telemedicine).  Patients are able to view lab/test results, encounter notes, upcoming appointments, etc.  Non-urgent messages can be sent to your provider as well.   To learn more about what you can do with MyChart, go to [email or web address].    Your next appointment:   1 month(s)  Provider:   You may see Dr. Azucena Cecil  or one of the following Advanced Practice Providers on your designated Care Team:   Nicolasa Ducking, NP Eula Listen, PA-C Cadence Fransico Michael, PA-C Charlsie Quest, NP

## 2022-10-01 NOTE — Progress Notes (Signed)
Cardiology Office Note:  .   Date:  10/01/2022  ID:  Jocelyn Wells, DOB 1956-01-15, MRN [id number] PCP: Jacky Kindle, FNP  [facility] Providers Cardiologist:  None    History of Present Illness: Marland Kitchen   Jocelyn Wells is a 67 y.o. female with past medical history of anxiety, HFimpEF, nonischemic cardiomyopathy, paroxysmal atrial fibrillation, hypertension, diabetes, former smoker, COPD, CVA, DVT, CKD, lumbar spinal stenosis, severe back pain, who is here today for follow-up.  Diagnosed with systolic congestive heart failure in 2017 with an EF of 45% that normalized to 60 to 65% in 2019.  CVA due to thrombosis of the left middle cerebral artery status post ILR placement in 2017.  Chronic anticoagulant due to DVT.  Echocardiogram completed 09/03/2022 revealed an LVEF of 50 to 55%, moderate left ventricular hypertrophy, G1 DD, and no valvular abnormalities noted.    08/2021 she underwent percutaneous transluminal angioplasty with stent placement to the left superficial femoral artery popliteal, percutaneous transluminal angioplasty of left common iliac artery, percutaneous transluminal angioplasty of the left peroneal artery and mechanical thrombectomy of the left tibioperoneal trunk.  Hospital course was complicated by acute renal failure requiring temporary dialysis.She subsequently returned to her baseline stage IV CKD.    She was admitted to [facility] 01/01/2022 with shortness of breath.  Hemoglobin was noted to be 6.7.  She was transfused and is also diuresed with Lasix.  She was followed by nephrology for AKI.  She was discharged 01/04/2022.  She was to have an outpatient colonoscopy and EGD in January 2024 and to be continued on iron infusions. She followed up with Dr. Gilda Crease from vascular for evaluation for left brachiocephalic fistula placement.  Patient had AV fistula placed on 06/18/2022.  Tolerated the procedure well without any immediate complications.  She was last seen in clinic 07/09/22  bu Dr Azucena Cecil, at that time she was concerned of these increasing blood pressure over the last several weeks and notedthat started a couple weeks prior.  Torsemide was increased to 20 mg daily with a repeat BMP today.  Repeat echocardiogram was ordered and her carvedilol was stopped due to low blood pressures.  She returns to clinic today accompanied by family member.  States her blood pressure has been running low and she has been feeling more tired since starting dialysis.  She undergoes dialysis on Tuesday, Thursday, and Saturday.  Previously with her visit with Dr.Agbor-Etang she was advised to stop taking her carvedilol.  Unfortunately today she returns that she is continued to take her carvedilol twice a day.  She denies any chest pain, palpitations, shortness of breath, or peripheral edema today.  Denies any syncope or near syncope but does note that over the last 2 weeks that she has had increased dizziness.  She denies any recent hospitalizations or visits to the emergency department.  ROS: 10 point review of systems has been reviewed and considered negative with exception of what is been listed in the HPI.  Studies Reviewed: Marland Kitchen       TTE 09/03/22 1. Left ventricular ejection fraction, by estimation, is 50 to 55%. The  left ventricle has low normal function. Unable to exclude regional wall  motion abnormailty in the setting of bundle branch block, possible mild  inferior wall hypokinesis (difficult  to visualize, definity not used). There is moderate left ventricular  hypertrophy. Left ventricular diastolic parameters are consistent with  Grade I diastolic dysfunction (impaired relaxation).   2. Right ventricular systolic function is normal.  The right ventricular  size is normal. Mildly increased right ventricular wall thickness. There  is mildly elevated pulmonary artery systolic pressure. The estimated right  ventricular systolic pressure is  38.2 mmHg.   3. Left atrial size was  moderately dilated.   4. The mitral valve is normal in structure. No evidence of mitral valve  regurgitation. No evidence of mitral stenosis.   5. The aortic valve has an indeterminant number of cusps. Aortic valve  regurgitation is not visualized. Aortic valve sclerosis is present, with  no evidence of aortic valve stenosis.   6. The inferior vena cava is normal in size with greater than 50%  respiratory variability, suggesting right atrial pressure of 3 mmHg.   Risk Assessment/Calculations:             Physical Exam:   VS:  BP (!) 88/50 (BP Location: Right Arm, Patient Position: Sitting, Cuff Size: Large)   Pulse 79   Ht 5\' 6"  (1.676 m)   Wt 195 lb 12.8 oz (88.8 kg)   SpO2 92%   BMI 31.60 kg/m    Wt Readings from Last 3 Encounters:  10/01/22 195 lb 12.8 oz (88.8 kg)  09/13/22 192 lb (87.1 kg)  08/24/22 213 lb 9.6 oz (96.9 kg)    GEN: Well nourished, well developed in no acute distress NECK: No JVD; No carotid bruits CARDIAC: RRR, no murmurs, rubs, gallops RESPIRATORY:  Clear with diminished bases to auscultation without rales, wheezing or rhonchi  ABDOMEN: Soft, non-tender, non-distended EXTREMITIES: 1+ edema; No deformity, left upper extremity AV fistula positive for bruit and thrill  ASSESSMENT AND PLAN: .   Nonischemic cardiomyopathy/HFimpEF last LVEF 5 echocardiogram 50 to 25%, no regional wall motion abnormality, and no valvular abnormalities noted.  Sherryll Burger previously had to be stopped due to CKD and hypertension.  Carvedilol has been stopped today due to hypotension.  She is continued on torsemide 20 mg once daily.  No changes made to her current medications today as we are limited on escalation to her end-stage renal disease now on hemodialysis.  Peripheral arterial disease where she underwent percutaneous stent placement on the left.  Hospital stay was complicated by AKI requiring dialysis during hospitalization.  She just recently had AV fistula placed by [facility] for  end-stage renal disease.  She is continued on apixaban and lieu of aspirin and statin therapy.  Hypotension without orthostasis with a blood pressure of 80/50 and a repeat of 88/50.  Carvedilol has been stopped today.  Patient is encouraged to continue to monitor pressures at home as well as with dialysis.  Mixed hyperlipidemia atorvastatin milligrams daily.  This continues to be followed by her PCP.  Chronic DVT of the femoral vein on the right lower extremity she has been maintained on chronic anticoagulation with apixaban 5 mg twice daily.  CKD stage IV with eGFR 16, followed by nephrology. Recently started on hemodialysis on Tuesday, Thursday, Saturday.  Anemia of chronic kidney disease with a hemoglobin of 5.1 08/09/2022 requiring multiple transfusions of blood and continued outpatient transfusions and iron transfusions.  Hemoglobin on 09/30/2022 11.8.  She continues to be followed by hematology.  COPD which is longstanding.  She continued on her regular pulmonary medications as ordered.  She continues to be followed by pulmonary with Dr. Jayme Cloud.    Dispo: Patient to return to clinic to see MD/APP in 4 weeks or sooner to reevaluate symptoms and blood pressure after medication changes made today.  DMV form for handicap placard was filled  out today per the patient's request.  Signed, Wava Kildow, NP

## 2022-10-02 DIAGNOSIS — N186 End stage renal disease: Secondary | ICD-10-CM | POA: Diagnosis not present

## 2022-10-02 DIAGNOSIS — D509 Iron deficiency anemia, unspecified: Secondary | ICD-10-CM | POA: Diagnosis not present

## 2022-10-02 DIAGNOSIS — D631 Anemia in chronic kidney disease: Secondary | ICD-10-CM | POA: Diagnosis not present

## 2022-10-02 DIAGNOSIS — Z992 Dependence on renal dialysis: Secondary | ICD-10-CM | POA: Diagnosis not present

## 2022-10-02 DIAGNOSIS — N250 Renal osteodystrophy: Secondary | ICD-10-CM | POA: Diagnosis not present

## 2022-10-02 DIAGNOSIS — N2581 Secondary hyperparathyroidism of renal origin: Secondary | ICD-10-CM | POA: Diagnosis not present

## 2022-10-05 ENCOUNTER — Ambulatory Visit: Payer: Medicare Other | Admitting: Oncology

## 2022-10-05 DIAGNOSIS — N186 End stage renal disease: Secondary | ICD-10-CM | POA: Diagnosis not present

## 2022-10-05 DIAGNOSIS — D509 Iron deficiency anemia, unspecified: Secondary | ICD-10-CM | POA: Diagnosis not present

## 2022-10-05 DIAGNOSIS — N250 Renal osteodystrophy: Secondary | ICD-10-CM | POA: Diagnosis not present

## 2022-10-05 DIAGNOSIS — N2581 Secondary hyperparathyroidism of renal origin: Secondary | ICD-10-CM | POA: Diagnosis not present

## 2022-10-05 DIAGNOSIS — Z992 Dependence on renal dialysis: Secondary | ICD-10-CM | POA: Diagnosis not present

## 2022-10-05 DIAGNOSIS — D631 Anemia in chronic kidney disease: Secondary | ICD-10-CM | POA: Diagnosis not present

## 2022-10-06 ENCOUNTER — Other Ambulatory Visit: Payer: Medicare Other

## 2022-10-06 ENCOUNTER — Inpatient Hospital Stay (HOSPITAL_BASED_OUTPATIENT_CLINIC_OR_DEPARTMENT_OTHER): Payer: Medicare Other | Admitting: Oncology

## 2022-10-06 ENCOUNTER — Ambulatory Visit: Payer: Medicare Other | Admitting: Oncology

## 2022-10-06 ENCOUNTER — Encounter: Payer: Self-pay | Admitting: Oncology

## 2022-10-06 VITALS — BP 126/50 | HR 74 | Temp 95.6°F | Resp 18 | Ht 66.0 in | Wt 197.7 lb

## 2022-10-06 DIAGNOSIS — Z992 Dependence on renal dialysis: Secondary | ICD-10-CM | POA: Diagnosis not present

## 2022-10-06 DIAGNOSIS — D473 Essential (hemorrhagic) thrombocythemia: Secondary | ICD-10-CM

## 2022-10-06 DIAGNOSIS — D500 Iron deficiency anemia secondary to blood loss (chronic): Secondary | ICD-10-CM | POA: Diagnosis not present

## 2022-10-06 DIAGNOSIS — D75839 Thrombocytosis, unspecified: Secondary | ICD-10-CM | POA: Diagnosis not present

## 2022-10-06 DIAGNOSIS — N189 Chronic kidney disease, unspecified: Secondary | ICD-10-CM | POA: Diagnosis not present

## 2022-10-06 DIAGNOSIS — Z1589 Genetic susceptibility to other disease: Secondary | ICD-10-CM | POA: Diagnosis not present

## 2022-10-06 DIAGNOSIS — D509 Iron deficiency anemia, unspecified: Secondary | ICD-10-CM | POA: Diagnosis not present

## 2022-10-06 DIAGNOSIS — D72829 Elevated white blood cell count, unspecified: Secondary | ICD-10-CM | POA: Diagnosis not present

## 2022-10-06 MED ORDER — HYDROXYUREA 500 MG PO CAPS: 500.0000 mg | ORAL_CAPSULE | ORAL | 3 refills | Status: DC

## 2022-10-06 NOTE — Progress Notes (Signed)
Hematology/Oncology Consult note [facility]  [phone] [phone]  Patient Care Team: Jacky Kindle, FNP as PCP - General (Family Medicine) Tedd Sias, Marlana Salvage, MD as Physician Assistant (Endocrinology) Duke Salvia, MD as Consulting Physician (Cardiology) Brandy Hale, MD (Inactive) as Referring Physician (Psychiatry) Deirdre Evener, MD as Consulting Physician (Dermatology) Dimmig, Maisie Fus, MD as Referring Physician (Orthopedic Surgery) Schnier, Latina Craver, MD (Vascular Surgery) Albin Felling, OD (Optometry) Gaspar Cola, [facility] (Inactive) (Pharmacist) Salena Saner, MD as Consulting Physician (Pulmonary Disease) Juanell Fairly, RN as Case Manager Creig Hines, MD as Consulting Physician (Oncology)   Name of the patient: Zakari Marotte  [id number]  1955-10-19   Date of visit: 10/06/22  Diagnosis- 1.  Iron deficiency anemia 2.  JAK2 positive thrombocytosis likely essential thrombocytosis  Chief complaint/ Reason for visit-discuss results of blood work  Heme/Onc history: patient is a 67 year old female with a past medical history significant for smoking, CKD, hypertension hyperlipidemia, COPD, history of stroke on Eliquis among other medical problems.She has been referred for leukocytosis.  Most recent CBC from 12/07/2021 showed white cell count of 13.9, H&H of 7.2/24.6 with an MCV of 80.9 and a platelet count of 535.  Differential is mainly shown neutrophilia with occasional monocytosis her white cell count at baseline up until July 2023 was around 11 and it dropped down to 8.2 in August 2023.  She has had some borderline microcytosis over the years.  No recent iron studies have been checked.  She has not seen GI recently.  Is any vaginal bleeding.  Denies any blood loss in her stool or urine.  Denies any dark melanotic stools.  Denies any consistent use of NSAIDs    Patient had an EGD and colonoscopy in January 2024 by Dr.  Allegra Lai we did not show any evidence of bleeding.  Endoscopy revealed mild chronic gastritis that was negative for H. pylori.She also had a capsule study which did not show any active bleeding  Interval history-patient has ongoing fatigue.  Denies any new complaints at this time.  She is currently undergoing dialysis 3 times a week.  ECOG PS- 2 Pain scale- 0   Review of systems- Review of Systems  Constitutional:  Negative for chills, fever, malaise/fatigue and weight loss.  HENT:  Negative for congestion, ear discharge and nosebleeds.   Eyes:  Negative for blurred vision.  Respiratory:  Negative for cough, hemoptysis, sputum production, shortness of breath and wheezing.   Cardiovascular:  Negative for chest pain, palpitations, orthopnea and claudication.  Gastrointestinal:  Negative for abdominal pain, blood in stool, constipation, diarrhea, heartburn, melena, nausea and vomiting.  Genitourinary:  Negative for dysuria, flank pain, frequency, hematuria and urgency.  Musculoskeletal:  Negative for back pain, joint pain and myalgias.  Skin:  Negative for rash.  Neurological:  Negative for dizziness, tingling, focal weakness, seizures, weakness and headaches.  Endo/Heme/Allergies:  Does not bruise/bleed easily.  Psychiatric/Behavioral:  Negative for depression and suicidal ideas. The patient does not have insomnia.       Allergies  Allergen Reactions   Codeine Nausea And Vomiting   Ivp Dye [Iodinated Contrast Media]     Patients states the IV Dye shuts her kidneys down     Past Medical History:  Diagnosis Date   Acute ischemic left MCA stroke (HCC) 01/29/2015   a.) MRI brain 01/29/2015: acute left MCA territory infarcts involving the posterior left frontal lobe and left insular cortex   Anemia of chronic  renal failure    Anxiety    Arthritis    Complication of anesthesia    a.) PONV; b.) awareness under anesthesia   COPD (chronic obstructive pulmonary disease) (HCC)    Depression     Dyspnea    ESRD (end stage renal disease) (HCC)    Headache    HFrEF (heart failure with reduced ejection fraction) (HCC)    a.) TTE 01/30/2015: EF 30-35%, diff HK, LAE, G2DD; b.) TTE 06/13/2015: EF 40-45%, diff HK, G1DD; c.) TTE 10/06/2017: EF 60-65% MAC, no IAS; d.) TTE 09/29/2021: EF 45-50%;, LVH, LAE, AoV sclerosis, G1DD; e.) TTE 02/24/2022: EF 60-65%, no IAS   HTN (hypertension)    Leukocytosis    Long term current use of anticoagulant    a.) apixaban   Long term current use of antithrombotics/antiplatelets    a.) clopidogrel   NICM (nonischemic cardiomyopathy) (HCC)    a.) TTE 01/30/2015: EF 30-35%; b.) R/LHC 03/25/2015: EF 25-30%, mRA 13, mPA 30, mPCWP 26, LVEDP 17, CO 3.66, CI 1.8; c.) TTE 06/13/2015: EF 40-45%; d.) TTE 10/06/2017: EF 60-65%; e.) TTE 09/29/2021: EF 45-50%; f.) TTE 02/24/2022: EF 60-65%   Obesity    On supplemental oxygen by nasal cannula    a.) 2L/[place] at night   Osteoporosis    Peripheral vascular disease (HCC)    Type 2 diabetes mellitus treated with insulin [facility])      Past Surgical History:  Procedure Laterality Date   ABDOMINAL HYSTERECTOMY     ANKLE FRACTURE SURGERY Left 02/09/2000   AV FISTULA PLACEMENT Left 06/18/2022   Procedure: ARTERIOVENOUS (AV) FISTULA CREATION (BRACHIAL AXILLA);  Surgeon: Renford Dills, MD;  Location: [facility] ORS;  Service: Vascular;  Laterality: Left;   BILATERAL SALPINGOOPHORECTOMY  02/08/1998   CARDIAC CATHETERIZATION N/A 03/25/2015   Procedure: Right/Left Heart Cath and Coronary Angiography;  Surgeon: Lyn Records, MD;  Location: [facility] INVASIVE CV LAB;  Service: Cardiovascular;  Laterality: N/A;   CESAREAN SECTION  02/09/1976   COLONOSCOPY WITH PROPOFOL N/A 09/22/2016   Procedure: COLONOSCOPY WITH PROPOFOL;  Surgeon: Scot Jun, MD;  Location: [facility] ENDOSCOPY;  Service: Endoscopy;  Laterality: N/A;   COLONOSCOPY WITH PROPOFOL N/A 02/15/2022   Procedure: COLONOSCOPY WITH PROPOFOL;  Surgeon: Toney Reil, MD;   Location: [facility] ENDOSCOPY;  Service: Gastroenterology;  Laterality: N/A;   EP IMPLANTABLE DEVICE N/A 08/05/2015   Procedure: Loop Recorder Insertion;  Surgeon: Duke Salvia, MD;  Location: [facility] INVASIVE CV LAB;  Service: Cardiovascular;  Laterality: N/A;   ESOPHAGOGASTRODUODENOSCOPY N/A 02/15/2022   Procedure: ESOPHAGOGASTRODUODENOSCOPY (EGD);  Surgeon: Toney Reil, MD;  Location: [facility] ENDOSCOPY;  Service: Gastroenterology;  Laterality: N/A;   ESOPHAGOGASTRODUODENOSCOPY (EGD) WITH PROPOFOL N/A 09/22/2016   Procedure: ESOPHAGOGASTRODUODENOSCOPY (EGD) WITH PROPOFOL;  Surgeon: Scot Jun, MD;  Location: [facility] ENDOSCOPY;  Service: Endoscopy;  Laterality: N/A;   ESOPHAGOGASTRODUODENOSCOPY (EGD) WITH PROPOFOL N/A 12/08/2016   Procedure: ESOPHAGOGASTRODUODENOSCOPY (EGD) WITH PROPOFOL;  Surgeon: Scot Jun, MD;  Location: [facility] ENDOSCOPY;  Service: Endoscopy;  Laterality: N/A;   FRACTURE SURGERY     GIVENS CAPSULE STUDY N/A 04/19/2022   Procedure: GIVENS CAPSULE STUDY;  Surgeon: Toney Reil, MD;  Location: [facility] ENDOSCOPY;  Service: Gastroenterology;  Laterality: N/A;   INSERTION HYBRID ANTERIOVENOUS GORTEX GRAFT  06/18/2022   Procedure: INSERTION HYBRID ANTERIOVENOUS GORTEX GRAFT;  Surgeon: Renford Dills, MD;  Location: [facility] ORS;  Service: Vascular;;   KNEE ARTHROSCOPY Left 02/09/2003   LOWER EXTREMITY ANGIOGRAPHY Left 08/12/2021   Procedure: Lower Extremity  Angiography;  Surgeon: Renford Dills, MD;  Location: [facility] INVASIVE CV LAB;  Service: Cardiovascular;  Laterality: Left;   TEE WITHOUT CARDIOVERSION N/A 01/31/2015   Procedure: TRANSESOPHAGEAL ECHOCARDIOGRAM (TEE);  Surgeon: Lewayne Bunting, MD;  Location: [facility] ENDOSCOPY;  Service: Cardiovascular;  Laterality: N/A;   TEMPORARY DIALYSIS CATHETER N/A 08/14/2021   Procedure: TEMPORARY DIALYSIS CATHETER;  Surgeon: Renford Dills, MD;  Location: [facility] INVASIVE CV LAB;  Service: Cardiovascular;  Laterality: N/A;   TUBAL  LIGATION  02/09/1976   ULNAR NERVE TRANSPOSITION  02/08/2006    Social History   Socioeconomic History   Marital status: Married    Spouse name: [name]   Number of children: 2   Years of education: Not on file   Highest education level: Bachelor's degree (e.g., BA, AB, BS)  Occupational History   Occupation: disabled    Comment: retired  Tobacco Use   Smoking status: Former    Current packs/day: 0.00    Average packs/day: 0.5 packs/day for 48.3 years (24.2 ttl pk-yrs)    Types: Cigarettes    Start date: 02/08/1974    Quit date: 05/31/2022    Years since quitting: 0.3   Smokeless tobacco: Never  Vaping Use   Vaping status: Never Used  Substance and Sexual Activity   Alcohol use: No    Alcohol/week: 0.0 standard drinks of alcohol   Drug use: No   Sexual activity: Yes    Birth control/protection: None  Other Topics Concern   Not on file  Social History Narrative   Lives at home with stepson and husband, dogs and cats   Caffeine  Drinks sweet tea.   Right handed.    Social Determinants of Health   Financial Resource Strain: Low Risk  (12/11/2021)   Overall Financial Resource Strain (CARDIA)    Difficulty of Paying Living Expenses: Not very hard  Food Insecurity: No Food Insecurity (01/05/2022)   Hunger Vital Sign    Worried About Running Out of Food in the Last Year: Never true    Ran Out of Food in the Last Year: Never true  Transportation Needs: No Transportation Needs (01/05/2022)   PRAPARE - Administrator, Civil Service (Medical): No    Lack of Transportation (Non-Medical): No  Physical Activity: Inactive (06/01/2022)   Exercise Vital Sign    Days of Exercise per Week: 0 days    Minutes of Exercise per Session: 0 min  Stress: No Stress Concern Present (06/01/2022)   [employer] of Occupational Health - Occupational Stress Questionnaire    Feeling of Stress : Only a little  Social Connections: Moderately Isolated (12/11/2021)   Social Connection  and Isolation Panel [NHANES]    Frequency of Communication with Friends and Family: More than three times a week    Frequency of Social Gatherings with Friends and Family: Once a week    Attends Religious Services: Never    Database administrator or Organizations: No    Attends Banker Meetings: Never    Marital Status: Married  Catering manager Violence: Not At Risk (01/02/2022)   Humiliation, Afraid, Rape, and Kick questionnaire    Fear of Current or Ex-Partner: No    Emotionally Abused: No    Physically Abused: No    Sexually Abused: No    Family History  Problem Relation Age of Onset   Heart disease Mother        died from CHF   Asthma Mother    Diabetes  Mother    Heart disease Father    Aneurysm Father    COPD Brother    Diabetes Brother    Alcohol abuse Paternal Aunt    Cancer Maternal Grandmother        unknownorigin   Anemia Neg Hx    Arrhythmia Neg Hx    Clotting disorder Neg Hx    Fainting Neg Hx    Heart attack Neg Hx    Heart failure Neg Hx    Hyperlipidemia Neg Hx    Hypertension Neg Hx      Current Outpatient Medications:    insulin aspart (NOVOLOG) 100 UNIT/ML injection, Insulin pump, Disp: , Rfl:    Insulin Disposable Pump (OMNIPOD DASH PODS, GEN 4,) MISC, USE 1 POD EACH EVERY 72    HOURS, Disp: , Rfl:    Insulin Human (INSULIN PUMP) SOLN, Per endocrinoloy, Disp: , Rfl:    loratadine (CLARITIN) 10 MG tablet, Take 1 tablet by mouth daily., Disp: , Rfl:    meclizine (ANTIVERT) 12.5 MG tablet, Take 1 tablet (12.5 mg total) by mouth 3 (three) times daily as needed for dizziness., Disp: 30 tablet, Rfl: 0   OXYGEN, Place 2 L into the nose continuous., Disp: , Rfl:    PARoxetine (PAXIL) 40 MG tablet, Take 1 tablet (40 mg total) by mouth daily., Disp: 90 tablet, Rfl: 1   torsemide (DEMADEX) 20 MG tablet, Take 1 tablet (20 mg) by mouth once daily, Disp: 270 tablet, Rfl: 1   traMADol (ULTRAM) 50 MG tablet, Take 1 tablet (50 mg total) by mouth every  12 (twelve) hours as needed for severe pain., Disp: 60 tablet, Rfl: 2   Vitamin D, Ergocalciferol, (DRISDOL) 1.25 MG (50000 UNIT) CAPS capsule, Take 50,000 Units by mouth every 7 (seven) days. Sunday, Disp: , Rfl:    albuterol (VENTOLIN HFA) 108 (90 Base) MCG/ACT inhaler, TAKE 2 PUFFS BY MOUTH EVERY 6 HOURS AS NEEDED FOR WHEEZE OR SHORTNESS OF BREATH (Patient taking differently: Inhale 2 puffs into the lungs every 6 (six) hours as needed for wheezing or shortness of breath.), Disp: 8.5 each, Rfl: 2   apixaban (ELIQUIS) 5 MG TABS tablet, Take 1 tablet (5 mg total) by mouth 2 (two) times daily., Disp: 180 tablet, Rfl: 2   atorvastatin (LIPITOR) 40 MG tablet, Take 1 tablet (40 mg total) by mouth daily., Disp: 90 tablet, Rfl: 3   buPROPion (WELLBUTRIN SR) 150 MG 12 hr tablet, TAKE 1 TABLET BY MOUTH TWICE A DAY, Disp: 180 tablet, Rfl: 0   calcitRIOL (ROCALTROL) 0.25 MCG capsule, Take 0.25 mcg by mouth daily., Disp: , Rfl:    Dulaglutide (TRULICITY) 4.5 MG/0.5ML SOPN, Inject 4.5 mg into the skin every 7 (seven) days. (Patient taking differently: Inject 4.5 mg into the skin every 7 (seven) days. Mondays), Disp: 6 mL, Rfl: 3   fluticasone (FLONASE) 50 MCG/ACT nasal spray, Place 2 sprays into both nostrils daily. (Patient taking differently: Place 2 sprays into both nostrils as needed.), Disp: 16 g, Rfl: 6   gabapentin (NEURONTIN) 300 MG capsule, Take 300 mg by mouth daily., Disp: , Rfl:    glucose blood test strip, , Disp: , Rfl:    Spacer/Aero-Holding Chambers (OPTICHAMBER DIAMOND) MISC, Use with inhaler to ensure medication is delivered throughout lungs (Patient not taking: Reported on 10/06/2022), Disp: 1 each, Rfl: 0  Physical exam:  Vitals:   10/06/22 1310  BP: (!) 126/50  Pulse: 74  Resp: 18  Temp: (!) 95.6 F (35.3 C)  TempSrc:  Tympanic  SpO2: 92%  Weight: 197 lb 11.2 oz (89.7 kg)  Height: 5\' 6"  (1.676 m)   Physical Exam Constitutional:      Comments: Sitting in a wheelchair.  Appears in no  acute distress  Cardiovascular:     Rate and Rhythm: Normal rate and regular rhythm.     Heart sounds: Normal heart sounds.  Pulmonary:     Effort: Pulmonary effort is normal.     Breath sounds: Normal breath sounds.  Abdominal:     General: Bowel sounds are normal.     Palpations: Abdomen is soft.  Skin:    General: Skin is warm and dry.  Neurological:     Mental Status: She is alert and oriented to person, place, and time.         Latest Ref Rng & Units 07/27/2022   10:55 AM  CMP  Glucose 70 - 99 mg/dL 119   BUN 8 - 23 mg/dL 47   Creatinine 1.47 - 1.00 mg/dL 8.29   Sodium 562 - 130 mmol/L 140   Potassium 3.5 - 5.1 mmol/L 3.8   Chloride 98 - 111 mmol/L 108   CO2 22 - 32 mmol/L 22   Calcium 8.9 - 10.3 mg/dL 7.1       Latest Ref Rng & Units 09/30/2022    3:28 PM  CBC  WBC 4.0 - 10.5 K/uL 13.9   Hemoglobin 12.0 - 15.0 g/dL 86.5   Hematocrit 78.4 - 46.0 % 40.3   Platelets 150 - 400 K/uL 573      Assessment and plan- Patient is a 67 y.o. female here for follow-up of following issues  Iron deficiency anemia: Hemoglobin is improved significantly after IV iron asPresently 11.8.  Iron studies are presently within normal limits and I am not offering her any further IV iron at this time.  Although given that she is on dialysis now and has chronic kidney disease 1 could consider keeping her ferritin levels closer to 100.  I will decide that based on her levels again in 3 months time  2.  Patient has evidence of leukocytosis and thrombocytosis.  She has a prior history of stroke and her JAK2 mutation testing was positive. Patient has evidence of leukocytosis and thrombocytosis.  She has a prior history of stroke and her JAK2 mutation testing was positive.  We are likely dealing with essential thrombocytosis and she falls in the high risk group and therefore would benefit from Hydrea.  Given that she has CKD I will start off at a lower dose of Hydrea either to 50 mg daily or 500 mg  every other day.  Discussed risks and benefits of Hydrea including all but not limited to nausea, dizziness, mouth sores and leg ulcers.  Treatment will begin with a palliative intent.  Patient understands and agrees to proceed as planned.  Since she is already on Eliquis I am not starting her on low-dose aspirin  Labs in 1 month and 2 months and I will see her back in 2 months   Visit Diagnosis 1. Iron deficiency anemia due to chronic blood loss   2. JAK2 gene mutation   3. Essential thrombocytosis (HCC)      Dr. Owens Shark, MD, MPH [facility] [phone] 10/06/2022 4:05 PM

## 2022-10-07 DIAGNOSIS — N186 End stage renal disease: Secondary | ICD-10-CM | POA: Diagnosis not present

## 2022-10-07 DIAGNOSIS — N2581 Secondary hyperparathyroidism of renal origin: Secondary | ICD-10-CM | POA: Diagnosis not present

## 2022-10-07 DIAGNOSIS — D631 Anemia in chronic kidney disease: Secondary | ICD-10-CM | POA: Diagnosis not present

## 2022-10-07 DIAGNOSIS — N250 Renal osteodystrophy: Secondary | ICD-10-CM | POA: Diagnosis not present

## 2022-10-07 DIAGNOSIS — Z992 Dependence on renal dialysis: Secondary | ICD-10-CM | POA: Diagnosis not present

## 2022-10-07 DIAGNOSIS — D509 Iron deficiency anemia, unspecified: Secondary | ICD-10-CM | POA: Diagnosis not present

## 2022-10-09 DIAGNOSIS — Z992 Dependence on renal dialysis: Secondary | ICD-10-CM | POA: Diagnosis not present

## 2022-10-09 DIAGNOSIS — N186 End stage renal disease: Secondary | ICD-10-CM | POA: Diagnosis not present

## 2022-10-11 DIAGNOSIS — N186 End stage renal disease: Secondary | ICD-10-CM | POA: Diagnosis not present

## 2022-10-11 DIAGNOSIS — D509 Iron deficiency anemia, unspecified: Secondary | ICD-10-CM | POA: Diagnosis not present

## 2022-10-11 DIAGNOSIS — D631 Anemia in chronic kidney disease: Secondary | ICD-10-CM | POA: Diagnosis not present

## 2022-10-11 DIAGNOSIS — Z992 Dependence on renal dialysis: Secondary | ICD-10-CM | POA: Diagnosis not present

## 2022-10-11 DIAGNOSIS — N250 Renal osteodystrophy: Secondary | ICD-10-CM | POA: Diagnosis not present

## 2022-10-11 DIAGNOSIS — N2581 Secondary hyperparathyroidism of renal origin: Secondary | ICD-10-CM | POA: Diagnosis not present

## 2022-10-12 ENCOUNTER — Ambulatory Visit: Payer: Medicare Other | Admitting: Anesthesiology

## 2022-10-12 ENCOUNTER — Ambulatory Visit
Admission: RE | Admit: 2022-10-12 | Discharge: 2022-10-12 | Disposition: A | Discharge status: Home / Self Care | Payer: Medicare Other | Source: Ambulatory Visit | Attending: Gastroenterology | Admitting: Gastroenterology

## 2022-10-12 ENCOUNTER — Encounter
Admission: RE | Disposition: A | Discharge status: Home / Self Care | Payer: Self-pay | Source: Ambulatory Visit | Attending: Gastroenterology

## 2022-10-12 DIAGNOSIS — D509 Iron deficiency anemia, unspecified: Secondary | ICD-10-CM | POA: Diagnosis not present

## 2022-10-12 DIAGNOSIS — Z8673 Personal history of transient ischemic attack (TIA), and cerebral infarction without residual deficits: Secondary | ICD-10-CM | POA: Diagnosis not present

## 2022-10-12 DIAGNOSIS — E1122 Type 2 diabetes mellitus with diabetic chronic kidney disease: Secondary | ICD-10-CM | POA: Insufficient documentation

## 2022-10-12 DIAGNOSIS — Z9981 Dependence on supplemental oxygen: Secondary | ICD-10-CM | POA: Insufficient documentation

## 2022-10-12 DIAGNOSIS — J449 Chronic obstructive pulmonary disease, unspecified: Secondary | ICD-10-CM | POA: Diagnosis not present

## 2022-10-12 DIAGNOSIS — M1990 Unspecified osteoarthritis, unspecified site: Secondary | ICD-10-CM | POA: Insufficient documentation

## 2022-10-12 DIAGNOSIS — K922 Gastrointestinal hemorrhage, unspecified: Secondary | ICD-10-CM | POA: Insufficient documentation

## 2022-10-12 DIAGNOSIS — I509 Heart failure, unspecified: Secondary | ICD-10-CM | POA: Diagnosis not present

## 2022-10-12 DIAGNOSIS — Z87891 Personal history of nicotine dependence: Secondary | ICD-10-CM | POA: Diagnosis not present

## 2022-10-12 DIAGNOSIS — I700 Atherosclerosis of aorta: Secondary | ICD-10-CM | POA: Diagnosis not present

## 2022-10-12 DIAGNOSIS — I5022 Chronic systolic (congestive) heart failure: Secondary | ICD-10-CM | POA: Insufficient documentation

## 2022-10-12 DIAGNOSIS — I132 Hypertensive heart and chronic kidney disease with heart failure and with stage 5 chronic kidney disease, or end stage renal disease: Secondary | ICD-10-CM | POA: Diagnosis not present

## 2022-10-12 DIAGNOSIS — Z992 Dependence on renal dialysis: Secondary | ICD-10-CM | POA: Diagnosis not present

## 2022-10-12 DIAGNOSIS — N186 End stage renal disease: Secondary | ICD-10-CM | POA: Insufficient documentation

## 2022-10-12 DIAGNOSIS — F418 Other specified anxiety disorders: Secondary | ICD-10-CM | POA: Insufficient documentation

## 2022-10-12 DIAGNOSIS — D631 Anemia in chronic kidney disease: Secondary | ICD-10-CM | POA: Diagnosis not present

## 2022-10-12 HISTORY — PX: GIVENS CAPSULE STUDY: SHX5432

## 2022-10-12 HISTORY — PX: ESOPHAGOGASTRODUODENOSCOPY (EGD) WITH PROPOFOL: SHX5813

## 2022-10-12 SURGERY — ESOPHAGOGASTRODUODENOSCOPY (EGD) WITH PROPOFOL
Anesthesia: General

## 2022-10-12 MED ORDER — SODIUM CHLORIDE 0.9 % IV SOLN: INTRAVENOUS | Status: DC

## 2022-10-12 MED ORDER — LIDOCAINE HCL (CARDIAC) PF 100 MG/5ML IV SOSY
PREFILLED_SYRINGE | INTRAVENOUS | Status: DC | PRN
  Administered 2022-10-12: 60 mg via INTRAVENOUS

## 2022-10-12 MED ORDER — PHENYLEPHRINE HCL (PRESSORS) 10 MG/ML IV SOLN
INTRAVENOUS | Status: DC | PRN
  Administered 2022-10-12: 160 ug via INTRAVENOUS

## 2022-10-12 MED ORDER — GLYCOPYRROLATE 0.2 MG/ML IJ SOLN
INTRAMUSCULAR | Status: DC | PRN
  Administered 2022-10-12: .2 mg via INTRAVENOUS

## 2022-10-12 MED ORDER — PROPOFOL 500 MG/50ML IV EMUL
INTRAVENOUS | Status: DC | PRN
  Administered 2022-10-12: 80 mg via INTRAVENOUS
  Administered 2022-10-12: 150 ug/kg/min via INTRAVENOUS

## 2022-10-12 MED ORDER — ONDANSETRON HCL 4 MG/2ML IJ SOLN
INTRAMUSCULAR | Status: DC | PRN
  Administered 2022-10-12: 4 mg via INTRAVENOUS

## 2022-10-12 MED ORDER — SODIUM CHLORIDE 0.9 % IV SOLN
INTRAVENOUS | Status: DC | PRN
Start: 2022-10-12 — End: 2022-10-12

## 2022-10-12 MED ORDER — EPHEDRINE SULFATE (PRESSORS) 50 MG/ML IJ SOLN
INTRAMUSCULAR | Status: DC | PRN
  Administered 2022-10-12 (×2): 5 mg via INTRAVENOUS

## 2022-10-12 NOTE — H&P (Signed)
Arlyss Repress, MD [address]  [address]  [place], [place] [address]  Main: [phone]  Fax: [phone] Pager: [phone]  Primary Care Physician:  Jacky Kindle, FNP Primary Gastroenterologist:  Dr. Arlyss Repress  Pre-Procedure History & Physical: HPI:  Jocelyn Wells is a 67 y.o. female is here for an endoscopy.   Past Medical History:  Diagnosis Date   Acute ischemic left MCA stroke (HCC) 01/29/2015   a.) MRI brain 01/29/2015: acute left MCA territory infarcts involving the posterior left frontal lobe and left insular cortex   Anemia of chronic renal failure    Anxiety    Arthritis    Complication of anesthesia    a.) PONV; b.) awareness under anesthesia   COPD (chronic obstructive pulmonary disease) (HCC)    Depression    Dyspnea    ESRD (end stage renal disease) (HCC)    Headache    HFrEF (heart failure with reduced ejection fraction) (HCC)    a.) TTE 01/30/2015: EF 30-35%, diff HK, LAE, G2DD; b.) TTE 06/13/2015: EF 40-45%, diff HK, G1DD; c.) TTE 10/06/2017: EF 60-65% MAC, no IAS; d.) TTE 09/29/2021: EF 45-50%;, LVH, LAE, AoV sclerosis, G1DD; e.) TTE 02/24/2022: EF 60-65%, no IAS   HTN (hypertension)    Leukocytosis    Long term current use of anticoagulant    a.) apixaban   Long term current use of antithrombotics/antiplatelets    a.) clopidogrel   NICM (nonischemic cardiomyopathy) (HCC)    a.) TTE 01/30/2015: EF 30-35%; b.) R/LHC 03/25/2015: EF 25-30%, mRA 13, mPA 30, mPCWP 26, LVEDP 17, CO 3.66, CI 1.8; c.) TTE 06/13/2015: EF 40-45%; d.) TTE 10/06/2017: EF 60-65%; e.) TTE 09/29/2021: EF 45-50%; f.) TTE 02/24/2022: EF 60-65%   Obesity    On supplemental oxygen by nasal cannula    a.) 2L/[place] at night   Osteoporosis    Peripheral vascular disease (HCC)    Type 2 diabetes mellitus treated with insulin [facility])     Past Surgical History:  Procedure Laterality Date   ABDOMINAL HYSTERECTOMY     ANKLE FRACTURE SURGERY Left 02/09/2000   AV FISTULA  PLACEMENT Left 06/18/2022   Procedure: ARTERIOVENOUS (AV) FISTULA CREATION (BRACHIAL AXILLA);  Surgeon: Renford Dills, MD;  Location: [facility] ORS;  Service: Vascular;  Laterality: Left;   BILATERAL SALPINGOOPHORECTOMY  02/08/1998   CARDIAC CATHETERIZATION N/A 03/25/2015   Procedure: Right/Left Heart Cath and Coronary Angiography;  Surgeon: Lyn Records, MD;  Location: [facility] INVASIVE CV LAB;  Service: Cardiovascular;  Laterality: N/A;   CESAREAN SECTION  02/09/1976   COLONOSCOPY WITH PROPOFOL N/A 09/22/2016   Procedure: COLONOSCOPY WITH PROPOFOL;  Surgeon: Scot Jun, MD;  Location: [facility] ENDOSCOPY;  Service: Endoscopy;  Laterality: N/A;   COLONOSCOPY WITH PROPOFOL N/A 02/15/2022   Procedure: COLONOSCOPY WITH PROPOFOL;  Surgeon: Toney Reil, MD;  Location: [facility] ENDOSCOPY;  Service: Gastroenterology;  Laterality: N/A;   EP IMPLANTABLE DEVICE N/A 08/05/2015   Procedure: Loop Recorder Insertion;  Surgeon: Duke Salvia, MD;  Location: [facility] INVASIVE CV LAB;  Service: Cardiovascular;  Laterality: N/A;   ESOPHAGOGASTRODUODENOSCOPY N/A 02/15/2022   Procedure: ESOPHAGOGASTRODUODENOSCOPY (EGD);  Surgeon: Toney Reil, MD;  Location: [facility] ENDOSCOPY;  Service: Gastroenterology;  Laterality: N/A;   ESOPHAGOGASTRODUODENOSCOPY (EGD) WITH PROPOFOL N/A 09/22/2016   Procedure: ESOPHAGOGASTRODUODENOSCOPY (EGD) WITH PROPOFOL;  Surgeon: Scot Jun, MD;  Location: [facility] ENDOSCOPY;  Service: Endoscopy;  Laterality: N/A;   ESOPHAGOGASTRODUODENOSCOPY (EGD) WITH PROPOFOL N/A 12/08/2016   Procedure: ESOPHAGOGASTRODUODENOSCOPY (EGD) WITH  PROPOFOL;  Surgeon: Scot Jun, MD;  Location: [facility] ENDOSCOPY;  Service: Endoscopy;  Laterality: N/A;   FRACTURE SURGERY     GIVENS CAPSULE STUDY N/A 04/19/2022   Procedure: GIVENS CAPSULE STUDY;  Surgeon: Toney Reil, MD;  Location: [facility] ENDOSCOPY;  Service: Gastroenterology;  Laterality: N/A;   INSERTION HYBRID ANTERIOVENOUS GORTEX GRAFT  06/18/2022    Procedure: INSERTION HYBRID ANTERIOVENOUS GORTEX GRAFT;  Surgeon: Renford Dills, MD;  Location: [facility] ORS;  Service: Vascular;;   KNEE ARTHROSCOPY Left 02/09/2003   LOWER EXTREMITY ANGIOGRAPHY Left 08/12/2021   Procedure: Lower Extremity Angiography;  Surgeon: Renford Dills, MD;  Location: [facility] INVASIVE CV LAB;  Service: Cardiovascular;  Laterality: Left;   TEE WITHOUT CARDIOVERSION N/A 01/31/2015   Procedure: TRANSESOPHAGEAL ECHOCARDIOGRAM (TEE);  Surgeon: Lewayne Bunting, MD;  Location: [facility] ENDOSCOPY;  Service: Cardiovascular;  Laterality: N/A;   TEMPORARY DIALYSIS CATHETER N/A 08/14/2021   Procedure: TEMPORARY DIALYSIS CATHETER;  Surgeon: Renford Dills, MD;  Location: [facility] INVASIVE CV LAB;  Service: Cardiovascular;  Laterality: N/A;   TUBAL LIGATION  02/09/1976   ULNAR NERVE TRANSPOSITION  02/08/2006    Prior to Admission medications   Medication Sig Start Date End Date Taking? Authorizing Provider  atorvastatin (LIPITOR) 40 MG tablet Take 1 tablet (40 mg total) by mouth daily. 09/08/21  Yes Merita Norton T, FNP  buPROPion [facility] SR) 150 MG 12 hr tablet TAKE 1 TABLET BY MOUTH TWICE A DAY 09/06/22  Yes Merita Norton T, FNP  calcitRIOL (ROCALTROL) 0.25 MCG capsule Take 0.25 mcg by mouth daily. 03/15/22  Yes [provider]  gabapentin (NEURONTIN) 300 MG capsule Take 300 mg by mouth daily.   Yes [provider]  hydroxyurea (HYDREA) 500 MG capsule Take 1 capsule (500 mg total) by mouth every other day. May take with food to minimize GI side effects. 10/06/22  Yes Creig Hines, MD  insulin aspart (NOVOLOG) 100 UNIT/ML injection Insulin pump 05/26/20  Yes [provider]  Insulin Disposable Pump (OMNIPOD DASH PODS, GEN 4,) MISC USE 1 POD EACH EVERY 72    HOURS 10/29/20  Yes [provider]  Insulin Human (INSULIN PUMP) SOLN Per endocrinoloy 06/01/20  Yes Elgergawy, Leana Roe, MD  loratadine (CLARITIN) 10 MG tablet Take 1 tablet by mouth daily.   Yes  [provider]  OXYGEN Place 2 L into the nose continuous.   Yes [provider]  Spacer/Aero-Holding [facility] DIAMOND) MISC Use with inhaler to ensure medication is delivered throughout lungs 02/13/21  Yes Merita Norton T, FNP  torsemide (DEMADEX) 20 MG tablet Take 1 tablet (20 mg) by mouth once daily 07/09/22  Yes Agbor-Etang, Arlys John, MD  traMADol (ULTRAM) 50 MG tablet Take 1 tablet (50 mg total) by mouth every 12 (twelve) hours as needed for severe pain. 07/28/22 10/26/22 Yes Edward Jolly, MD  Vitamin D, Ergocalciferol, (DRISDOL) 1.25 MG (50000 UNIT) CAPS capsule Take 50,000 Units by mouth every 7 (seven) days. Sunday 01/24/22  Yes [provider]  albuterol (VENTOLIN HFA) 108 (90 Base) MCG/ACT inhaler TAKE 2 PUFFS BY MOUTH EVERY 6 HOURS AS NEEDED FOR WHEEZE OR SHORTNESS OF BREATH Patient taking differently: Inhale 2 puffs into the lungs every 6 (six) hours as needed for wheezing or shortness of breath. 06/19/21   Jacky Kindle, FNP  apixaban (ELIQUIS) 5 MG TABS tablet Take 1 tablet (5 mg total) by mouth 2 (two) times daily. 02/11/22   Charlsie Quest, NP  Dulaglutide (TRULICITY) 4.5 MG/0.5ML SOPN  Inject 4.5 mg into the skin every 7 (seven) days. Patient taking differently: Inject 4.5 mg into the skin every 7 (seven) days. Mondays 07/12/22     fluticasone (FLONASE) 50 MCG/ACT nasal spray Place 2 sprays into both nostrils daily. Patient taking differently: Place 2 sprays into both nostrils as needed. 02/13/21   Jacky Kindle, FNP  glucose blood test strip     [provider]  meclizine (ANTIVERT) 12.5 MG tablet Take 1 tablet (12.5 mg total) by mouth 3 (three) times daily as needed for dizziness. 11/23/21   Caro Laroche, DO  PARoxetine (PAXIL) 40 MG tablet Take 1 tablet (40 mg total) by mouth daily. 04/10/22 10/07/22  Neysa Hotter, MD    Allergies as of 09/01/2022 - Review Complete 08/24/2022  Allergen Reaction Noted   Codeine Nausea And Vomiting  07/05/2014   Ivp dye [iodinated contrast media]  12/10/2021    Family History  Problem Relation Age of Onset   Heart disease Mother        died from CHF   Asthma Mother    Diabetes Mother    Heart disease Father    Aneurysm Father    COPD Brother    Diabetes Brother    Alcohol abuse Paternal Aunt    Cancer Maternal Grandmother        unknownorigin   Anemia Neg Hx    Arrhythmia Neg Hx    Clotting disorder Neg Hx    Fainting Neg Hx    Heart attack Neg Hx    Heart failure Neg Hx    Hyperlipidemia Neg Hx    Hypertension Neg Hx     Social History   Socioeconomic History   Marital status: Married    Spouse name: [name]   Number of children: 2   Years of education: Not on file   Highest education level: Bachelor's degree (e.g., BA, AB, BS)  Occupational History   Occupation: disabled    Comment: retired  Tobacco Use   Smoking status: Former    Current packs/day: 0.00    Average packs/day: 0.5 packs/day for 48.3 years (24.2 ttl pk-yrs)    Types: Cigarettes    Start date: 02/08/1974    Quit date: 05/31/2022    Years since quitting: 0.3   Smokeless tobacco: Never  Vaping Use   Vaping status: Never Used  Substance and Sexual Activity   Alcohol use: No    Alcohol/week: 0.0 standard drinks of alcohol   Drug use: No   Sexual activity: Yes    Birth control/protection: None  Other Topics Concern   Not on file  Social History Narrative   Lives at home with stepson and husband, dogs and cats   Caffeine  Drinks sweet tea.   Right handed.    Social Determinants of Health   Financial Resource Strain: Low Risk  (12/11/2021)   Overall Financial Resource Strain (CARDIA)    Difficulty of Paying Living Expenses: Not very hard  Food Insecurity: No Food Insecurity (01/05/2022)   Hunger Vital Sign    Worried About Running Out of Food in the Last Year: Never true    Ran Out of Food in the Last Year: Never true  Transportation Needs: No Transportation Needs (01/05/2022)    PRAPARE - Administrator, Civil Service (Medical): No    Lack of Transportation (Non-Medical): No  Physical Activity: Inactive (06/01/2022)   Exercise Vital Sign    Days of Exercise per Week: 0 days  Minutes of Exercise per Session: 0 min  Stress: No Stress Concern Present (06/01/2022)   [employer] of Occupational Health - Occupational Stress Questionnaire    Feeling of Stress : Only a little  Social Connections: Moderately Isolated (12/11/2021)   Social Connection and Isolation Panel [NHANES]    Frequency of Communication with Friends and Family: More than three times a week    Frequency of Social Gatherings with Friends and Family: Once a week    Attends Religious Services: Never    Database administrator or Organizations: No    Attends Banker Meetings: Never    Marital Status: Married  Catering manager Violence: Not At Risk (01/02/2022)   Humiliation, Afraid, Rape, and Kick questionnaire    Fear of Current or Ex-Partner: No    Emotionally Abused: No    Physically Abused: No    Sexually Abused: No    Review of Systems: See HPI, otherwise negative ROS  Physical Exam: BP (!) 152/64   Pulse 68   Temp (!) 94 F (34.4 C) (Temporal)   Resp 18   Ht 5\' 6"  (1.676 m)   Wt 90.1 kg   SpO2 94%   BMI 32.06 kg/m  General:   Alert,  pleasant and cooperative in NAD Head:  Normocephalic and atraumatic. Neck:  Supple; no masses or thyromegaly. Lungs:  Clear throughout to auscultation.    Heart:  Regular rate and rhythm. Abdomen:  Soft, nontender and nondistended. Normal bowel sounds, without guarding, and without rebound.   Neurologic:  Alert and  oriented x4;  grossly normal neurologically.  Impression/Plan: [name] is here for an endoscopy to be performed for endoscopic placement of video capsule, IDA  Risks, benefits, limitations, and alternatives regarding  EGD have been reviewed with the patient.  Questions have been answered.  All parties  agreeable.   Lannette Donath, MD  10/12/2022, 7:43 AM

## 2022-10-12 NOTE — Anesthesia Postprocedure Evaluation (Signed)
Anesthesia Post Note  Patient: Tonjua A Mckell  Procedure(s) Performed: ESOPHAGOGASTRODUODENOSCOPY (EGD) WITH PROPOFOL GIVENS CAPSULE STUDY  Patient location during evaluation: Endoscopy Anesthesia Type: General Level of consciousness: awake and alert Pain management: pain level controlled Vital Signs Assessment: post-procedure vital signs reviewed and stable Respiratory status: spontaneous breathing, nonlabored ventilation, respiratory function stable and patient connected to nasal cannula oxygen Cardiovascular status: blood pressure returned to baseline and stable Postop Assessment: no apparent nausea or vomiting Anesthetic complications: no   No notable events documented.   Last Vitals:  Vitals:   10/12/22 0832 10/12/22 0842  BP: (!) 122/59 (!) 139/55  Pulse:    Resp:    Temp:    SpO2:  100%    Last Pain:  Vitals:   10/12/22 0842  TempSrc:   PainSc: 0-No pain                 Louie Boston

## 2022-10-12 NOTE — Op Note (Signed)
[facility] Gastroenterology Patient Name: Jocelyn Wells Procedure Date: 10/12/2022 7:57 AM MRN: [id number] Account #: [id number] Date of Birth: 04-May-1955 Admit Type: Outpatient Age: 67 Room: [facility] ENDO ROOM 3 Gender: Female Note Status: Finalized Instrument Name: Patton Salles Endoscope [id number] Procedure:             Upper GI endoscopy Indications:           Unexplained iron deficiency anemia, Gastrointestinal                         bleeding source not found during previous colonoscopy Providers:             Toney Reil MD, MD Referring MD:          Daryl Eastern. Suzie Portela (Referring MD) Medicines:             General Anesthesia Complications:         No immediate complications. Estimated blood loss: None. Procedure:             Pre-Anesthesia Assessment:                        - Prior to the procedure, a History and Physical was                         performed, and patient medications and allergies were                         reviewed. The patient is competent. The risks and                         benefits of the procedure and the sedation options and                         risks were discussed with the patient. All questions                         were answered and informed consent was obtained.                         Patient identification and proposed procedure were                         verified by the physician, the nurse, the                         anesthesiologist, the anesthetist and the technician                         in the pre-procedure area in the procedure room in the                         endoscopy suite. Mental Status Examination: alert and                         oriented. Airway Examination: normal oropharyngeal                         airway and neck mobility. Respiratory Examination:  clear to auscultation. CV Examination: normal.                         Prophylactic Antibiotics: The patient does not require                          prophylactic antibiotics. Prior Anticoagulants: The                         patient has taken no anticoagulant or antiplatelet                         agents. ASA Grade Assessment: IV - A patient with                         severe systemic disease that is a constant threat to                         life. After reviewing the risks and benefits, the                         patient was deemed in satisfactory condition to                         undergo the procedure. The anesthesia plan was to use                         general anesthesia. Immediately prior to                         administration of medications, the patient was                         re-assessed for adequacy to receive sedatives. The                         heart rate, respiratory rate, oxygen saturations,                         blood pressure, adequacy of pulmonary ventilation, and                         response to care were monitored throughout the                         procedure. The physical status of the patient was                         re-assessed after the procedure.                        After obtaining informed consent, the endoscope was                         passed under direct vision. Throughout the procedure,                         the patient's blood pressure, pulse, and oxygen  saturations were monitored continuously. The Endoscope                         was introduced through the mouth, and advanced to the                         second part of duodenum. The upper GI endoscopy was                         accomplished without difficulty. The patient tolerated                         the procedure well. Findings:      The esophagus was normal.      The stomach was normal.      The examined duodenum was normal. Using the endoscope, the video capsule       enteroscope was advanced into the second portion of the duodenum. Impression:            - Normal  esophagus.                        - Normal stomach.                        - Normal examined duodenum.                        - Successful completion of the Video Capsule                         Enteroscope placement.                        - No specimens collected. Recommendation:        - Discharge patient to home (with escort).                        - Resume previous diet today.                        - Continue present medications. Procedure Code(s):     --- Professional ---                        [phone], Esophagogastroduodenoscopy, flexible,                         transoral; diagnostic, including collection of                         specimen(s) by brushing or washing, when performed                         (separate procedure) Diagnosis Code(s):     --- Professional ---                        D50.9, Iron deficiency anemia, unspecified                        K92.2, Gastrointestinal hemorrhage, unspecified CPT copyright 2022 American Medical Association. All rights reserved. The codes documented in  this report are preliminary and upon coder review may  be revised to meet current compliance requirements. Dr. Libby Maw Toney Reil MD, MD 10/12/2022 8:20:27 AM This report has been signed electronically. Number of Addenda: 0 Note Initiated On: 10/12/2022 7:57 AM Estimated Blood Loss:  Estimated blood loss: none.      [facility]

## 2022-10-12 NOTE — Transfer of Care (Signed)
Immediate Anesthesia Transfer of Care Note  Patient: Jocelyn Wells  Procedure(s) Performed: ESOPHAGOGASTRODUODENOSCOPY (EGD) WITH PROPOFOL GIVENS CAPSULE STUDY  Patient Location: PACU  Anesthesia Type:General  Level of Consciousness: drowsy  Airway & Oxygen Therapy: Patient Spontanous Breathing and Patient connected to face mask oxygen  Post-op Assessment: Report given to RN and Post -op Vital signs reviewed and stable  Post vital signs: Reviewed  Last Vitals:  Vitals Value Taken Time  BP 107/68 10/12/22 0829  Temp 35.9 C 10/12/22 0822  Pulse 87 10/12/22 0829  Resp 22 10/12/22 0829  SpO2 100 % 10/12/22 0829  Vitals shown include unfiled device data.  Last Pain:  Vitals:   10/12/22 0822  TempSrc: Temporal  PainSc:          Complications: No notable events documented.

## 2022-10-12 NOTE — Anesthesia Preprocedure Evaluation (Addendum)
Anesthesia Evaluation  Patient identified by MRN, date of birth, ID band Patient awake    Reviewed: Allergy & Precautions, NPO status , Patient's Chart, lab work & pertinent test results  History of Anesthesia Complications (+) PONV, AWARENESS UNDER ANESTHESIA and history of anesthetic complications  Airway Mallampati: III  TM Distance: >3 FB Neck ROM: full    Dental  (+) Edentulous Upper, Edentulous Lower   Pulmonary shortness of breath and with exertion, COPD (on 2L [place] at night),  COPD inhaler and oxygen dependent, former smoker   Pulmonary exam normal        Cardiovascular hypertension, On Medications + Peripheral Vascular Disease, +CHF and + DOE  Normal cardiovascular exam  ECHO 08/2022  IMPRESSIONS     1. Left ventricular ejection fraction, by estimation, is 50 to 55%. The  left ventricle has low normal function. Unable to exclude regional wall  motion abnormailty in the setting of bundle branch block, possible mild  inferior wall hypokinesis (difficult  to visualize, definity not used). There is moderate left ventricular  hypertrophy. Left ventricular diastolic parameters are consistent with  Grade I diastolic dysfunction (impaired relaxation).   2. Right ventricular systolic function is normal. The right ventricular  size is normal. Mildly increased right ventricular wall thickness. There  is mildly elevated pulmonary artery systolic pressure. The estimated right  ventricular systolic pressure is  38.2 mmHg.   3. Left atrial size was moderately dilated.   4. The mitral valve is normal in structure. No evidence of mitral valve  regurgitation. No evidence of mitral stenosis.   5. The aortic valve has an indeterminant number of cusps. Aortic valve  regurgitation is not visualized. Aortic valve sclerosis is present, with  no evidence of aortic valve stenosis.   6. The inferior vena cava is normal in size with greater than  50%  respiratory variability, suggesting right atrial pressure of 3 mmHg.     Neuro/Psych  Headaches PSYCHIATRIC DISORDERS Anxiety Depression    CVA    GI/Hepatic negative GI ROS, Neg liver ROS,,,  Endo/Other  diabetes    Renal/GU ESRF and DialysisRenal disease  negative genitourinary   Musculoskeletal  (+) Arthritis ,    Abdominal   Peds  Hematology  (+) Blood dyscrasia, anemia On Eliquis    Anesthesia Other Findings Past Medical History: 01/29/2015: Acute ischemic left MCA stroke (HCC)     Comment:  a.) MRI brain 01/29/2015: acute left MCA territory               infarcts involving the posterior left frontal lobe and               left insular cortex No date: Anemia of chronic renal failure No date: Anxiety No date: Arthritis No date: Complication of anesthesia     Comment:  a.) PONV; b.) awareness under anesthesia No date: COPD (chronic obstructive pulmonary disease) (HCC) No date: Depression No date: Dyspnea No date: ESRD (end stage renal disease) (HCC) No date: Headache No date: HFrEF (heart failure with reduced ejection fraction) (HCC)     Comment:  a.) TTE 01/30/2015: EF 30-35%, diff HK, LAE, G2DD; b.)               TTE 06/13/2015: EF 40-45%, diff HK, G1DD; c.) TTE               10/06/2017: EF 60-65% MAC, no IAS; d.) TTE 09/29/2021: EF  45-50%;, LVH, LAE, AoV sclerosis, G1DD; e.) TTE               02/24/2022: EF 60-65%, no IAS No date: HTN (hypertension) No date: Leukocytosis No date: Long term current use of anticoagulant     Comment:  a.) apixaban No date: Long term current use of antithrombotics/antiplatelets     Comment:  a.) clopidogrel No date: NICM (nonischemic cardiomyopathy) (HCC)     Comment:  a.) TTE 01/30/2015: EF 30-35%; b.) R/LHC 03/25/2015: EF               25-30%, mRA 13, mPA 30, mPCWP 26, LVEDP 17, CO 3.66, CI               1.8; c.) TTE 06/13/2015: EF 40-45%; d.) TTE 10/06/2017:               EF 60-65%; e.) TTE 09/29/2021: EF  45-50%; f.) TTE               02/24/2022: EF 60-65% No date: Obesity No date: On supplemental oxygen by nasal cannula     Comment:  a.) 2L/[place] at night No date: Osteoporosis No date: Peripheral vascular disease (HCC) No date: Type 2 diabetes mellitus treated with insulin [facility])  Past Surgical History: No date: ABDOMINAL HYSTERECTOMY 02/09/2000: ANKLE FRACTURE SURGERY; Left 06/18/2022: AV FISTULA PLACEMENT; Left     Comment:  Procedure: ARTERIOVENOUS (AV) FISTULA CREATION (BRACHIAL              AXILLA);  Surgeon: Renford Dills, MD;  Location:               [facility] ORS;  Service: Vascular;  Laterality: Left; 02/08/1998: BILATERAL SALPINGOOPHORECTOMY 03/25/2015: CARDIAC CATHETERIZATION; N/A     Comment:  Procedure: Right/Left Heart Cath and Coronary               Angiography;  Surgeon: Lyn Records, MD;  Location: [facility]               INVASIVE CV LAB;  Service: Cardiovascular;  Laterality:               N/A; 02/09/1976: CESAREAN SECTION 09/22/2016: COLONOSCOPY WITH PROPOFOL; N/A     Comment:  Procedure: COLONOSCOPY WITH PROPOFOL;  Surgeon: Scot Jun, MD;  Location: [facility] ENDOSCOPY;  Service:               Endoscopy;  Laterality: N/A; 02/15/2022: COLONOSCOPY WITH PROPOFOL; N/A     Comment:  Procedure: COLONOSCOPY WITH PROPOFOL;  Surgeon: Toney Reil, MD;  Location: [facility] ENDOSCOPY;  Service:               Gastroenterology;  Laterality: N/A; 08/05/2015: EP IMPLANTABLE DEVICE; N/A     Comment:  Procedure: Loop Recorder Insertion;  Surgeon: Duke Salvia, MD;  Location: [facility] INVASIVE CV LAB;  Service:               Cardiovascular;  Laterality: N/A; 02/15/2022: ESOPHAGOGASTRODUODENOSCOPY; N/A     Comment:  Procedure: ESOPHAGOGASTRODUODENOSCOPY (EGD);  Surgeon:               Toney Reil, MD;  Location: [facility] ENDOSCOPY;  Service: Gastroenterology;  Laterality: N/A; 09/22/2016: ESOPHAGOGASTRODUODENOSCOPY (EGD) WITH  PROPOFOL; N/A     Comment:  Procedure: ESOPHAGOGASTRODUODENOSCOPY (EGD) WITH               PROPOFOL;  Surgeon: Scot Jun, MD;  Location:               [facility] ENDOSCOPY;  Service: Endoscopy;  Laterality: N/A; 12/08/2016: ESOPHAGOGASTRODUODENOSCOPY (EGD) WITH PROPOFOL; N/A     Comment:  Procedure: ESOPHAGOGASTRODUODENOSCOPY (EGD) WITH               PROPOFOL;  Surgeon: Scot Jun, MD;  Location:               [facility] ENDOSCOPY;  Service: Endoscopy;  Laterality: N/A; No date: FRACTURE SURGERY 04/19/2022: GIVENS CAPSULE STUDY; N/A     Comment:  Procedure: GIVENS CAPSULE STUDY;  Surgeon: Toney Reil, MD;  Location: [facility] ENDOSCOPY;  Service:               Gastroenterology;  Laterality: N/A; 06/18/2022: INSERTION HYBRID ANTERIOVENOUS GORTEX GRAFT     Comment:  Procedure: INSERTION HYBRID ANTERIOVENOUS GORTEX GRAFT;               Surgeon: Renford Dills, MD;  Location: [facility] ORS;                Service: Vascular;; 02/09/2003: KNEE ARTHROSCOPY; Left 08/12/2021: LOWER EXTREMITY ANGIOGRAPHY; Left     Comment:  Procedure: Lower Extremity Angiography;  Surgeon:               Renford Dills, MD;  Location: [facility] INVASIVE CV LAB;               Service: Cardiovascular;  Laterality: Left; 01/31/2015: TEE WITHOUT CARDIOVERSION; N/A     Comment:  Procedure: TRANSESOPHAGEAL ECHOCARDIOGRAM (TEE);                Surgeon: Lewayne Bunting, MD;  Location: [facility] ENDOSCOPY;                Service: Cardiovascular;  Laterality: N/A; 08/14/2021: TEMPORARY DIALYSIS CATHETER; N/A     Comment:  Procedure: TEMPORARY DIALYSIS CATHETER;  Surgeon:               Renford Dills, MD;  Location: [facility] INVASIVE CV LAB;               Service: Cardiovascular;  Laterality: N/A; 02/09/1976: TUBAL LIGATION 02/08/2006: ULNAR NERVE TRANSPOSITION  BMI    Body Mass Index: 32.06 kg/m      Reproductive/Obstetrics negative OB ROS                             Anesthesia  Physical Anesthesia Plan  ASA: 4  Anesthesia Plan: General   Post-op Pain Management: Minimal or no pain anticipated   Induction: Intravenous  PONV Risk Score and Plan: 3 and Propofol infusion and TIVA  Airway Management Planned: Natural Airway and Nasal Cannula  Additional Equipment:   Intra-op Plan:   Post-operative Plan:   Informed Consent: I have reviewed the patients History and Physical, chart, labs and discussed the procedure including the risks, benefits and alternatives for the proposed anesthesia with the patient or authorized representative who has indicated his/her understanding and acceptance.     Dental Advisory Given  Plan Discussed with: Anesthesiologist, CRNA and Surgeon  Anesthesia Plan Comments: (Patient consented for risks of anesthesia including but not limited to:  - adverse reactions to medications - risk of airway placement if required - damage to eyes, teeth, lips or other oral mucosa - nerve damage due to positioning  - sore throat or hoarseness - Damage to heart, brain, nerves, lungs, other parts of body or loss of life  Patient voiced understanding.)        Anesthesia Quick Evaluation

## 2022-10-13 ENCOUNTER — Encounter: Payer: Self-pay | Admitting: Gastroenterology

## 2022-10-13 ENCOUNTER — Other Ambulatory Visit: Payer: Medicare Other

## 2022-10-13 ENCOUNTER — Ambulatory Visit: Payer: Medicare Other | Admitting: Oncology

## 2022-10-13 DIAGNOSIS — N2581 Secondary hyperparathyroidism of renal origin: Secondary | ICD-10-CM | POA: Diagnosis not present

## 2022-10-13 DIAGNOSIS — N250 Renal osteodystrophy: Secondary | ICD-10-CM | POA: Diagnosis not present

## 2022-10-13 DIAGNOSIS — Z992 Dependence on renal dialysis: Secondary | ICD-10-CM | POA: Diagnosis not present

## 2022-10-13 DIAGNOSIS — N186 End stage renal disease: Secondary | ICD-10-CM | POA: Diagnosis not present

## 2022-10-13 DIAGNOSIS — D631 Anemia in chronic kidney disease: Secondary | ICD-10-CM | POA: Diagnosis not present

## 2022-10-13 DIAGNOSIS — D509 Iron deficiency anemia, unspecified: Secondary | ICD-10-CM | POA: Diagnosis not present

## 2022-10-16 DIAGNOSIS — N186 End stage renal disease: Secondary | ICD-10-CM | POA: Diagnosis not present

## 2022-10-16 DIAGNOSIS — D509 Iron deficiency anemia, unspecified: Secondary | ICD-10-CM | POA: Diagnosis not present

## 2022-10-16 DIAGNOSIS — N250 Renal osteodystrophy: Secondary | ICD-10-CM | POA: Diagnosis not present

## 2022-10-16 DIAGNOSIS — N2581 Secondary hyperparathyroidism of renal origin: Secondary | ICD-10-CM | POA: Diagnosis not present

## 2022-10-16 DIAGNOSIS — Z992 Dependence on renal dialysis: Secondary | ICD-10-CM | POA: Diagnosis not present

## 2022-10-16 DIAGNOSIS — D631 Anemia in chronic kidney disease: Secondary | ICD-10-CM | POA: Diagnosis not present

## 2022-10-18 ENCOUNTER — Telehealth: Payer: Self-pay

## 2022-10-18 DIAGNOSIS — D509 Iron deficiency anemia, unspecified: Secondary | ICD-10-CM

## 2022-10-18 NOTE — Telephone Encounter (Signed)
-----   Message from [facility] sent at 10/18/2022 10:33 AM EDT ----- Regarding: VCE Capsule did not reach cecum, please order KUB Otherwise, normal capsule study  RV

## 2022-10-18 NOTE — Telephone Encounter (Signed)
Patient verbalized understanding of results and she states she will go get the X ray done as soon as she can. Gave her the hours of the Xray department and told her [facility] mall

## 2022-10-19 ENCOUNTER — Encounter: Payer: Medicare Other | Admitting: Student in an Organized Health Care Education/Training Program

## 2022-10-19 DIAGNOSIS — Z992 Dependence on renal dialysis: Secondary | ICD-10-CM | POA: Diagnosis not present

## 2022-10-19 DIAGNOSIS — D631 Anemia in chronic kidney disease: Secondary | ICD-10-CM | POA: Diagnosis not present

## 2022-10-19 DIAGNOSIS — N186 End stage renal disease: Secondary | ICD-10-CM | POA: Diagnosis not present

## 2022-10-19 DIAGNOSIS — D509 Iron deficiency anemia, unspecified: Secondary | ICD-10-CM | POA: Diagnosis not present

## 2022-10-19 DIAGNOSIS — N250 Renal osteodystrophy: Secondary | ICD-10-CM | POA: Diagnosis not present

## 2022-10-19 DIAGNOSIS — N2581 Secondary hyperparathyroidism of renal origin: Secondary | ICD-10-CM | POA: Diagnosis not present

## 2022-10-21 DIAGNOSIS — D631 Anemia in chronic kidney disease: Secondary | ICD-10-CM | POA: Diagnosis not present

## 2022-10-21 DIAGNOSIS — N186 End stage renal disease: Secondary | ICD-10-CM | POA: Diagnosis not present

## 2022-10-21 DIAGNOSIS — Z992 Dependence on renal dialysis: Secondary | ICD-10-CM | POA: Diagnosis not present

## 2022-10-21 DIAGNOSIS — N250 Renal osteodystrophy: Secondary | ICD-10-CM | POA: Diagnosis not present

## 2022-10-21 DIAGNOSIS — D509 Iron deficiency anemia, unspecified: Secondary | ICD-10-CM | POA: Diagnosis not present

## 2022-10-21 DIAGNOSIS — N2581 Secondary hyperparathyroidism of renal origin: Secondary | ICD-10-CM | POA: Diagnosis not present

## 2022-10-22 DIAGNOSIS — E113513 Type 2 diabetes mellitus with proliferative diabetic retinopathy with macular edema, bilateral: Secondary | ICD-10-CM | POA: Diagnosis not present

## 2022-10-22 DIAGNOSIS — H43811 Vitreous degeneration, right eye: Secondary | ICD-10-CM | POA: Diagnosis not present

## 2022-10-22 DIAGNOSIS — H31013 Macula scars of posterior pole (postinflammatory) (post-traumatic), bilateral: Secondary | ICD-10-CM | POA: Diagnosis not present

## 2022-10-22 DIAGNOSIS — H2513 Age-related nuclear cataract, bilateral: Secondary | ICD-10-CM | POA: Diagnosis not present

## 2022-10-23 DIAGNOSIS — Z992 Dependence on renal dialysis: Secondary | ICD-10-CM | POA: Diagnosis not present

## 2022-10-23 DIAGNOSIS — D631 Anemia in chronic kidney disease: Secondary | ICD-10-CM | POA: Diagnosis not present

## 2022-10-23 DIAGNOSIS — N2581 Secondary hyperparathyroidism of renal origin: Secondary | ICD-10-CM | POA: Diagnosis not present

## 2022-10-23 DIAGNOSIS — N250 Renal osteodystrophy: Secondary | ICD-10-CM | POA: Diagnosis not present

## 2022-10-23 DIAGNOSIS — D509 Iron deficiency anemia, unspecified: Secondary | ICD-10-CM | POA: Diagnosis not present

## 2022-10-23 DIAGNOSIS — N186 End stage renal disease: Secondary | ICD-10-CM | POA: Diagnosis not present

## 2022-10-25 ENCOUNTER — Encounter: Payer: Self-pay | Admitting: Student in an Organized Health Care Education/Training Program

## 2022-10-25 ENCOUNTER — Ambulatory Visit
Payer: Medicare Other | Attending: Student in an Organized Health Care Education/Training Program | Admitting: Student in an Organized Health Care Education/Training Program

## 2022-10-25 VITALS — BP 150/71 | HR 72 | Temp 97.5°F | Resp 16 | Ht 66.0 in | Wt 195.0 lb

## 2022-10-25 DIAGNOSIS — M48061 Spinal stenosis, lumbar region without neurogenic claudication: Secondary | ICD-10-CM

## 2022-10-25 DIAGNOSIS — G894 Chronic pain syndrome: Secondary | ICD-10-CM | POA: Diagnosis not present

## 2022-10-25 DIAGNOSIS — E1140 Type 2 diabetes mellitus with diabetic neuropathy, unspecified: Secondary | ICD-10-CM | POA: Diagnosis present

## 2022-10-25 DIAGNOSIS — S32010S Wedge compression fracture of first lumbar vertebra, sequela: Secondary | ICD-10-CM

## 2022-10-25 DIAGNOSIS — S22000A Wedge compression fracture of unspecified thoracic vertebra, initial encounter for closed fracture: Secondary | ICD-10-CM | POA: Diagnosis present

## 2022-10-25 DIAGNOSIS — Z794 Long term (current) use of insulin: Secondary | ICD-10-CM | POA: Diagnosis not present

## 2022-10-25 DIAGNOSIS — M48062 Spinal stenosis, lumbar region with neurogenic claudication: Secondary | ICD-10-CM | POA: Diagnosis present

## 2022-10-25 MED ORDER — TRAMADOL HCL 50 MG PO TABS: 50.0000 mg | ORAL_TABLET | Freq: Two times a day (BID) | ORAL | 2 refills | Status: DC | PRN

## 2022-10-25 NOTE — Progress Notes (Signed)
Nursing Pain Medication Assessment:  Safety precautions to be maintained throughout the outpatient stay will include: orient to surroundings, keep bed in low position, maintain call bell within reach at all times, provide assistance with transfer out of bed and ambulation.  Medication Inspection Compliance: Pill count conducted under aseptic conditions, in front of the patient. Neither the pills nor the bottle was removed from the patient's sight at any time. Once count was completed pills were immediately returned to the patient in their original bottle.  Medication: See above Pill/Patch Count:  6 of 14 pills remain Pill/Patch Appearance: Markings consistent with prescribed medication Bottle Appearance: Standard pharmacy container. Clearly labeled. Filled Date: 81 / 2 / 2024 Last Medication intake:  Today

## 2022-10-25 NOTE — Patient Instructions (Signed)
Pain Management Discharge Instructions  General Discharge Instructions :  If you need to reach your doctor call: Monday-Friday 8:00 am - 4:00 pm at [phone] or toll free [phone].  After clinic hours [phone] to have operator reach doctor.  Bring all of your medication bottles to all your appointments in the pain clinic.  To cancel or reschedule your appointment with Pain Management please remember to call 24 hours in advance to avoid a fee.  Refer to the educational materials which you have been given on: General Risks, I had my Procedure. Discharge Instructions, Post Sedation.  Post Procedure Instructions:  The drugs you were given will stay in your system until tomorrow, so for the next 24 hours you should not drive, make any legal decisions or drink any alcoholic beverages.  You may eat anything you prefer, but it is better to start with liquids then soups and crackers, and gradually work up to solid foods.  Please notify your doctor immediately if you have any unusual bleeding, trouble breathing or pain that is not related to your normal pain.  Depending on the type of procedure that was done, some parts of your body may feel week and/or numb.  This usually clears up by tonight or the next day.  Walk with the use of an assistive device or accompanied by an adult for the 24 hours.  You may use ice on the affected area for the first 24 hours.  Put ice in a Ziploc bag and cover with a towel and place against area 15 minutes on 15 minutes off.  You may switch to heat after 24 hours.

## 2022-10-25 NOTE — Progress Notes (Signed)
PROVIDER NOTE: Information contained herein reflects review and annotations entered in association with encounter. Interpretation of such information and data should be left to medically-trained personnel. Information provided to patient can be located elsewhere in the medical record under "Patient Instructions". Document created using STT-dictation technology, any transcriptional errors that may result from process are unintentional.    Patient: Jocelyn Wells  Service Category: E/M  Provider: Edward Jolly, MD  DOB: 06-04-1955  DOS: 10/25/2022  Referring Provider: Jacky Kindle, FNP  MRN: [id number]  Specialty: Interventional Pain Management  PCP: Jacky Kindle, FNP  Type: Established Patient  Setting: Ambulatory outpatient    Location: Office  Delivery: Face-to-face     HPI  [name], a 67 y.o. year old female, is here today because of her Chronic painful diabetic neuropathy (HCC) [E11.40]. Jocelyn Wells primary complain today is Back Pain (lower)  Pertinent problems: Ms. Korin has H/O: osteoarthritis; Anxiety, generalized; Arthritis, degenerative; Morbid obesity due to excess calories (HCC); Compression fracture of L1 lumbar vertebra (HCC); Compression fracture of body of thoracic vertebra (HCC); Lumbar foraminal stenosis (RIGHT L1); and Spinal stenosis of lumbar region with neurogenic claudication (L4-L5) on their pertinent problem list. Pain Assessment: Severity of Chronic pain is reported as a 4 /10. Location: Back Lower, Left, Right/hips bilateral down back of legs to calf. Onset: More than a month ago. Quality: Aching, Constant, Sore, Shooting, Sharp, Restless. Timing: Constant. Modifying factor(s): meds, rest, sitting. Vitals:  height is 5\' 6"  (1.676 m) and weight is 195 lb (88.5 kg). Her temperature is 97.5 F (36.4 C) (abnormal). Her blood pressure is 150/71 (abnormal) and her pulse is 72. Her respiration is 16 and oxygen saturation is 96%.  BMI: Estimated body mass index is  31.47 kg/m as calculated from the following:   Height as of this encounter: 5\' 6"  (1.676 m).   Weight as of this encounter: 195 lb (88.5 kg). Last encounter: 05/11/2022. Last procedure: 06/09/2022.  Reason for encounter: both, medication management and post-procedure evaluation and assessment.   Patient recently started dialysis for CKD 4 Endorses benefit from second Qutenza treatment as below Unfortunately pharmacy only filled 14 tablets of her tramadol at her last prescription pickup due to a change in insurance. She is due for a urine screen for annual medication compliance monitoring.   Post-procedure evaluation   Type: Qutenza Neurolysis #2  Laterality:  Bilateral Area treated: Feet Imaging Guidance: None Anesthesia/analgesia/anxiolysis/sedation: None required Medication (Right): Qutenza (capsaicin 8%) topical system Medication (Left): Qutenza (capsaicin 8%) topical system Date: 09/13/2022 Performed by: Edward Jolly, MD Rationale (medical necessity): procedure needed and proper for the treatment of Jocelyn Wells's medical symptoms and needs. Indication: Painful diabetic peripheral neuralgia (DPN) (ICD-10-CM:E11.40) severe enough to impact quality of life or function. 1. Chronic painful diabetic neuropathy (HCC)    NAS-11 Pain score:   Pre-procedure: 0-No pain/10   Post-procedure: 0-No pain/10      Effectiveness:  Initial hour after procedure: 0 %  Subsequent 4-6 hours post-procedure: 0 %  Analgesia past initial 6 hours: 80 % (current)  Ongoing improvement:  Analgesic:  80% Function: Jocelyn Wells reports improvement in function ROM: Jocelyn Wells reports improvement in ROM    Pharmacotherapy Assessment  Analgesic: Tramadol 50 mg twice daily as needed, quantity 60/month    Monitoring: [place] PMP: PDMP reviewed during this encounter.       Pharmacotherapy: No side-effects or adverse reactions reported. Compliance: No problems identified. Effectiveness: Clinically  acceptable.  Newman Pies, RN  Height:       66.0 in Accession #:    [id number]     Weight:       213.6 lb Date of Birth:  [birth year]      BSA:          2.057 m Patient Age:    67 years       BP:           130/74 mmHg Patient Gender: F              HR:           76 bpm. Exam Location:  [facility]  Procedure: 2D Echo, Cardiac Doppler and Color Doppler  Indications:    R06.02 SOB   History:        Patient has prior history of  Echocardiogram examinations, most                 recent 09/29/2021. CHF and Cardiomyopathy, Stroke, Pulmonary HTN                 and COPD, Signs/Symptoms:Shortness of Breath and Edema; Risk                 Factors:Hypertension, Diabetes, Dyslipidemia and Former Smoker.   Sonographer:    Quentin Ore RDMS, RVT, RDCS Referring Phys: [id number] BRIAN AGBOR-ETANG  IMPRESSIONS   1. Left ventricular ejection fraction, by estimation, is 50 to 55%. The left ventricle has low normal function. Unable to exclude regional wall motion abnormailty in the setting of bundle branch block, possible mild inferior wall hypokinesis (difficult  to visualize, definity not used). There is moderate left ventricular hypertrophy. Left ventricular diastolic parameters are consistent with Grade I diastolic dysfunction (impaired relaxation).  2. Right ventricular systolic function is normal. The right ventricular size is normal. Mildly increased right ventricular wall thickness. There is mildly elevated pulmonary artery systolic pressure. The estimated right ventricular systolic pressure is  38.2 mmHg.  3. Left atrial size was moderately dilated.  4. The mitral valve is normal in structure. No evidence of mitral valve regurgitation. No evidence of mitral stenosis.  5. The aortic valve has an indeterminant number of cusps. Aortic valve regurgitation is not visualized. Aortic valve sclerosis is present, with no evidence of aortic valve stenosis.  6. The inferior vena cava is normal in size with greater than 50% respiratory variability, suggesting right atrial pressure of 3 mmHg.  FINDINGS  Left Ventricle: Left ventricular ejection fraction, by estimation, is 50 to 55%. The left ventricle has low normal function. The left ventricle has no regional wall motion abnormalities. The left ventricular internal cavity size was normal in size.  There is moderate left ventricular hypertrophy. Left ventricular diastolic parameters are  consistent with Grade I diastolic dysfunction (impaired relaxation).  Right Ventricle: The right ventricular size is normal. Mildly increased right ventricular wall thickness. Right ventricular systolic function is normal. There is mildly elevated pulmonary artery systolic pressure. The tricuspid regurgitant velocity is  2.88 m/s, and with an assumed right atrial pressure of 5 mmHg, the estimated right ventricular systolic pressure is 38.2 mmHg.  Left Atrium: Left atrial size was moderately dilated.  Right Atrium: Right atrial size was normal in size.  Pericardium: There is no evidence of pericardial effusion.  Mitral Valve: The mitral valve is normal in structure. Mild mitral annular calcification. No evidence of mitral valve regurgitation. No evidence of mitral valve stenosis.  Tricuspid Valve: The tricuspid valve is normal in structure. Tricuspid valve regurgitation  PROVIDER NOTE: Information contained herein reflects review and annotations entered in association with encounter. Interpretation of such information and data should be left to medically-trained personnel. Information provided to patient can be located elsewhere in the medical record under "Patient Instructions". Document created using STT-dictation technology, any transcriptional errors that may result from process are unintentional.    Patient: Jocelyn Wells  Service Category: E/M  Provider: Edward Jolly, MD  DOB: 06-04-1955  DOS: 10/25/2022  Referring Provider: Jacky Kindle, FNP  MRN: [id number]  Specialty: Interventional Pain Management  PCP: Jacky Kindle, FNP  Type: Established Patient  Setting: Ambulatory outpatient    Location: Office  Delivery: Face-to-face     HPI  [name], a 67 y.o. year old female, is here today because of her Chronic painful diabetic neuropathy (HCC) [E11.40]. Jocelyn Wells primary complain today is Back Pain (lower)  Pertinent problems: Ms. Korin has H/O: osteoarthritis; Anxiety, generalized; Arthritis, degenerative; Morbid obesity due to excess calories (HCC); Compression fracture of L1 lumbar vertebra (HCC); Compression fracture of body of thoracic vertebra (HCC); Lumbar foraminal stenosis (RIGHT L1); and Spinal stenosis of lumbar region with neurogenic claudication (L4-L5) on their pertinent problem list. Pain Assessment: Severity of Chronic pain is reported as a 4 /10. Location: Back Lower, Left, Right/hips bilateral down back of legs to calf. Onset: More than a month ago. Quality: Aching, Constant, Sore, Shooting, Sharp, Restless. Timing: Constant. Modifying factor(s): meds, rest, sitting. Vitals:  height is 5\' 6"  (1.676 m) and weight is 195 lb (88.5 kg). Her temperature is 97.5 F (36.4 C) (abnormal). Her blood pressure is 150/71 (abnormal) and her pulse is 72. Her respiration is 16 and oxygen saturation is 96%.  BMI: Estimated body mass index is  31.47 kg/m as calculated from the following:   Height as of this encounter: 5\' 6"  (1.676 m).   Weight as of this encounter: 195 lb (88.5 kg). Last encounter: 05/11/2022. Last procedure: 06/09/2022.  Reason for encounter: both, medication management and post-procedure evaluation and assessment.   Patient recently started dialysis for CKD 4 Endorses benefit from second Qutenza treatment as below Unfortunately pharmacy only filled 14 tablets of her tramadol at her last prescription pickup due to a change in insurance. She is due for a urine screen for annual medication compliance monitoring.   Post-procedure evaluation   Type: Qutenza Neurolysis #2  Laterality:  Bilateral Area treated: Feet Imaging Guidance: None Anesthesia/analgesia/anxiolysis/sedation: None required Medication (Right): Qutenza (capsaicin 8%) topical system Medication (Left): Qutenza (capsaicin 8%) topical system Date: 09/13/2022 Performed by: Edward Jolly, MD Rationale (medical necessity): procedure needed and proper for the treatment of Jocelyn Wells's medical symptoms and needs. Indication: Painful diabetic peripheral neuralgia (DPN) (ICD-10-CM:E11.40) severe enough to impact quality of life or function. 1. Chronic painful diabetic neuropathy (HCC)    NAS-11 Pain score:   Pre-procedure: 0-No pain/10   Post-procedure: 0-No pain/10      Effectiveness:  Initial hour after procedure: 0 %  Subsequent 4-6 hours post-procedure: 0 %  Analgesia past initial 6 hours: 80 % (current)  Ongoing improvement:  Analgesic:  80% Function: Jocelyn Wells reports improvement in function ROM: Jocelyn Wells reports improvement in ROM    Pharmacotherapy Assessment  Analgesic: Tramadol 50 mg twice daily as needed, quantity 60/month    Monitoring: [place] PMP: PDMP reviewed during this encounter.       Pharmacotherapy: No side-effects or adverse reactions reported. Compliance: No problems identified. Effectiveness: Clinically  acceptable.  Newman Pies, RN  Height:       66.0 in Accession #:    [id number]     Weight:       213.6 lb Date of Birth:  [birth year]      BSA:          2.057 m Patient Age:    67 years       BP:           130/74 mmHg Patient Gender: F              HR:           76 bpm. Exam Location:  [facility]  Procedure: 2D Echo, Cardiac Doppler and Color Doppler  Indications:    R06.02 SOB   History:        Patient has prior history of  Echocardiogram examinations, most                 recent 09/29/2021. CHF and Cardiomyopathy, Stroke, Pulmonary HTN                 and COPD, Signs/Symptoms:Shortness of Breath and Edema; Risk                 Factors:Hypertension, Diabetes, Dyslipidemia and Former Smoker.   Sonographer:    Quentin Ore RDMS, RVT, RDCS Referring Phys: [id number] BRIAN AGBOR-ETANG  IMPRESSIONS   1. Left ventricular ejection fraction, by estimation, is 50 to 55%. The left ventricle has low normal function. Unable to exclude regional wall motion abnormailty in the setting of bundle branch block, possible mild inferior wall hypokinesis (difficult  to visualize, definity not used). There is moderate left ventricular hypertrophy. Left ventricular diastolic parameters are consistent with Grade I diastolic dysfunction (impaired relaxation).  2. Right ventricular systolic function is normal. The right ventricular size is normal. Mildly increased right ventricular wall thickness. There is mildly elevated pulmonary artery systolic pressure. The estimated right ventricular systolic pressure is  38.2 mmHg.  3. Left atrial size was moderately dilated.  4. The mitral valve is normal in structure. No evidence of mitral valve regurgitation. No evidence of mitral stenosis.  5. The aortic valve has an indeterminant number of cusps. Aortic valve regurgitation is not visualized. Aortic valve sclerosis is present, with no evidence of aortic valve stenosis.  6. The inferior vena cava is normal in size with greater than 50% respiratory variability, suggesting right atrial pressure of 3 mmHg.  FINDINGS  Left Ventricle: Left ventricular ejection fraction, by estimation, is 50 to 55%. The left ventricle has low normal function. The left ventricle has no regional wall motion abnormalities. The left ventricular internal cavity size was normal in size.  There is moderate left ventricular hypertrophy. Left ventricular diastolic parameters are  consistent with Grade I diastolic dysfunction (impaired relaxation).  Right Ventricle: The right ventricular size is normal. Mildly increased right ventricular wall thickness. Right ventricular systolic function is normal. There is mildly elevated pulmonary artery systolic pressure. The tricuspid regurgitant velocity is  2.88 m/s, and with an assumed right atrial pressure of 5 mmHg, the estimated right ventricular systolic pressure is 38.2 mmHg.  Left Atrium: Left atrial size was moderately dilated.  Right Atrium: Right atrial size was normal in size.  Pericardium: There is no evidence of pericardial effusion.  Mitral Valve: The mitral valve is normal in structure. Mild mitral annular calcification. No evidence of mitral valve regurgitation. No evidence of mitral valve stenosis.  Tricuspid Valve: The tricuspid valve is normal in structure. Tricuspid valve regurgitation  PROVIDER NOTE: Information contained herein reflects review and annotations entered in association with encounter. Interpretation of such information and data should be left to medically-trained personnel. Information provided to patient can be located elsewhere in the medical record under "Patient Instructions". Document created using STT-dictation technology, any transcriptional errors that may result from process are unintentional.    Patient: Jocelyn Wells  Service Category: E/M  Provider: Edward Jolly, MD  DOB: 06-04-1955  DOS: 10/25/2022  Referring Provider: Jacky Kindle, FNP  MRN: [id number]  Specialty: Interventional Pain Management  PCP: Jacky Kindle, FNP  Type: Established Patient  Setting: Ambulatory outpatient    Location: Office  Delivery: Face-to-face     HPI  [name], a 67 y.o. year old female, is here today because of her Chronic painful diabetic neuropathy (HCC) [E11.40]. Jocelyn Wells primary complain today is Back Pain (lower)  Pertinent problems: Ms. Korin has H/O: osteoarthritis; Anxiety, generalized; Arthritis, degenerative; Morbid obesity due to excess calories (HCC); Compression fracture of L1 lumbar vertebra (HCC); Compression fracture of body of thoracic vertebra (HCC); Lumbar foraminal stenosis (RIGHT L1); and Spinal stenosis of lumbar region with neurogenic claudication (L4-L5) on their pertinent problem list. Pain Assessment: Severity of Chronic pain is reported as a 4 /10. Location: Back Lower, Left, Right/hips bilateral down back of legs to calf. Onset: More than a month ago. Quality: Aching, Constant, Sore, Shooting, Sharp, Restless. Timing: Constant. Modifying factor(s): meds, rest, sitting. Vitals:  height is 5\' 6"  (1.676 m) and weight is 195 lb (88.5 kg). Her temperature is 97.5 F (36.4 C) (abnormal). Her blood pressure is 150/71 (abnormal) and her pulse is 72. Her respiration is 16 and oxygen saturation is 96%.  BMI: Estimated body mass index is  31.47 kg/m as calculated from the following:   Height as of this encounter: 5\' 6"  (1.676 m).   Weight as of this encounter: 195 lb (88.5 kg). Last encounter: 05/11/2022. Last procedure: 06/09/2022.  Reason for encounter: both, medication management and post-procedure evaluation and assessment.   Patient recently started dialysis for CKD 4 Endorses benefit from second Qutenza treatment as below Unfortunately pharmacy only filled 14 tablets of her tramadol at her last prescription pickup due to a change in insurance. She is due for a urine screen for annual medication compliance monitoring.   Post-procedure evaluation   Type: Qutenza Neurolysis #2  Laterality:  Bilateral Area treated: Feet Imaging Guidance: None Anesthesia/analgesia/anxiolysis/sedation: None required Medication (Right): Qutenza (capsaicin 8%) topical system Medication (Left): Qutenza (capsaicin 8%) topical system Date: 09/13/2022 Performed by: Edward Jolly, MD Rationale (medical necessity): procedure needed and proper for the treatment of Jocelyn Wells's medical symptoms and needs. Indication: Painful diabetic peripheral neuralgia (DPN) (ICD-10-CM:E11.40) severe enough to impact quality of life or function. 1. Chronic painful diabetic neuropathy (HCC)    NAS-11 Pain score:   Pre-procedure: 0-No pain/10   Post-procedure: 0-No pain/10      Effectiveness:  Initial hour after procedure: 0 %  Subsequent 4-6 hours post-procedure: 0 %  Analgesia past initial 6 hours: 80 % (current)  Ongoing improvement:  Analgesic:  80% Function: Jocelyn Wells reports improvement in function ROM: Jocelyn Wells reports improvement in ROM    Pharmacotherapy Assessment  Analgesic: Tramadol 50 mg twice daily as needed, quantity 60/month    Monitoring: [place] PMP: PDMP reviewed during this encounter.       Pharmacotherapy: No side-effects or adverse reactions reported. Compliance: No problems identified. Effectiveness: Clinically  acceptable.  Newman Pies, RN  Height:       66.0 in Accession #:    [id number]     Weight:       213.6 lb Date of Birth:  [birth year]      BSA:          2.057 m Patient Age:    67 years       BP:           130/74 mmHg Patient Gender: F              HR:           76 bpm. Exam Location:  [facility]  Procedure: 2D Echo, Cardiac Doppler and Color Doppler  Indications:    R06.02 SOB   History:        Patient has prior history of  Echocardiogram examinations, most                 recent 09/29/2021. CHF and Cardiomyopathy, Stroke, Pulmonary HTN                 and COPD, Signs/Symptoms:Shortness of Breath and Edema; Risk                 Factors:Hypertension, Diabetes, Dyslipidemia and Former Smoker.   Sonographer:    Quentin Ore RDMS, RVT, RDCS Referring Phys: [id number] BRIAN AGBOR-ETANG  IMPRESSIONS   1. Left ventricular ejection fraction, by estimation, is 50 to 55%. The left ventricle has low normal function. Unable to exclude regional wall motion abnormailty in the setting of bundle branch block, possible mild inferior wall hypokinesis (difficult  to visualize, definity not used). There is moderate left ventricular hypertrophy. Left ventricular diastolic parameters are consistent with Grade I diastolic dysfunction (impaired relaxation).  2. Right ventricular systolic function is normal. The right ventricular size is normal. Mildly increased right ventricular wall thickness. There is mildly elevated pulmonary artery systolic pressure. The estimated right ventricular systolic pressure is  38.2 mmHg.  3. Left atrial size was moderately dilated.  4. The mitral valve is normal in structure. No evidence of mitral valve regurgitation. No evidence of mitral stenosis.  5. The aortic valve has an indeterminant number of cusps. Aortic valve regurgitation is not visualized. Aortic valve sclerosis is present, with no evidence of aortic valve stenosis.  6. The inferior vena cava is normal in size with greater than 50% respiratory variability, suggesting right atrial pressure of 3 mmHg.  FINDINGS  Left Ventricle: Left ventricular ejection fraction, by estimation, is 50 to 55%. The left ventricle has low normal function. The left ventricle has no regional wall motion abnormalities. The left ventricular internal cavity size was normal in size.  There is moderate left ventricular hypertrophy. Left ventricular diastolic parameters are  consistent with Grade I diastolic dysfunction (impaired relaxation).  Right Ventricle: The right ventricular size is normal. Mildly increased right ventricular wall thickness. Right ventricular systolic function is normal. There is mildly elevated pulmonary artery systolic pressure. The tricuspid regurgitant velocity is  2.88 m/s, and with an assumed right atrial pressure of 5 mmHg, the estimated right ventricular systolic pressure is 38.2 mmHg.  Left Atrium: Left atrial size was moderately dilated.  Right Atrium: Right atrial size was normal in size.  Pericardium: There is no evidence of pericardial effusion.  Mitral Valve: The mitral valve is normal in structure. Mild mitral annular calcification. No evidence of mitral valve regurgitation. No evidence of mitral valve stenosis.  Tricuspid Valve: The tricuspid valve is normal in structure. Tricuspid valve regurgitation  PROVIDER NOTE: Information contained herein reflects review and annotations entered in association with encounter. Interpretation of such information and data should be left to medically-trained personnel. Information provided to patient can be located elsewhere in the medical record under "Patient Instructions". Document created using STT-dictation technology, any transcriptional errors that may result from process are unintentional.    Patient: Jocelyn Wells  Service Category: E/M  Provider: Edward Jolly, MD  DOB: 06-04-1955  DOS: 10/25/2022  Referring Provider: Jacky Kindle, FNP  MRN: [id number]  Specialty: Interventional Pain Management  PCP: Jacky Kindle, FNP  Type: Established Patient  Setting: Ambulatory outpatient    Location: Office  Delivery: Face-to-face     HPI  [name], a 67 y.o. year old female, is here today because of her Chronic painful diabetic neuropathy (HCC) [E11.40]. Jocelyn Wells primary complain today is Back Pain (lower)  Pertinent problems: Ms. Korin has H/O: osteoarthritis; Anxiety, generalized; Arthritis, degenerative; Morbid obesity due to excess calories (HCC); Compression fracture of L1 lumbar vertebra (HCC); Compression fracture of body of thoracic vertebra (HCC); Lumbar foraminal stenosis (RIGHT L1); and Spinal stenosis of lumbar region with neurogenic claudication (L4-L5) on their pertinent problem list. Pain Assessment: Severity of Chronic pain is reported as a 4 /10. Location: Back Lower, Left, Right/hips bilateral down back of legs to calf. Onset: More than a month ago. Quality: Aching, Constant, Sore, Shooting, Sharp, Restless. Timing: Constant. Modifying factor(s): meds, rest, sitting. Vitals:  height is 5\' 6"  (1.676 m) and weight is 195 lb (88.5 kg). Her temperature is 97.5 F (36.4 C) (abnormal). Her blood pressure is 150/71 (abnormal) and her pulse is 72. Her respiration is 16 and oxygen saturation is 96%.  BMI: Estimated body mass index is  31.47 kg/m as calculated from the following:   Height as of this encounter: 5\' 6"  (1.676 m).   Weight as of this encounter: 195 lb (88.5 kg). Last encounter: 05/11/2022. Last procedure: 06/09/2022.  Reason for encounter: both, medication management and post-procedure evaluation and assessment.   Patient recently started dialysis for CKD 4 Endorses benefit from second Qutenza treatment as below Unfortunately pharmacy only filled 14 tablets of her tramadol at her last prescription pickup due to a change in insurance. She is due for a urine screen for annual medication compliance monitoring.   Post-procedure evaluation   Type: Qutenza Neurolysis #2  Laterality:  Bilateral Area treated: Feet Imaging Guidance: None Anesthesia/analgesia/anxiolysis/sedation: None required Medication (Right): Qutenza (capsaicin 8%) topical system Medication (Left): Qutenza (capsaicin 8%) topical system Date: 09/13/2022 Performed by: Edward Jolly, MD Rationale (medical necessity): procedure needed and proper for the treatment of Jocelyn Wells's medical symptoms and needs. Indication: Painful diabetic peripheral neuralgia (DPN) (ICD-10-CM:E11.40) severe enough to impact quality of life or function. 1. Chronic painful diabetic neuropathy (HCC)    NAS-11 Pain score:   Pre-procedure: 0-No pain/10   Post-procedure: 0-No pain/10      Effectiveness:  Initial hour after procedure: 0 %  Subsequent 4-6 hours post-procedure: 0 %  Analgesia past initial 6 hours: 80 % (current)  Ongoing improvement:  Analgesic:  80% Function: Jocelyn Wells reports improvement in function ROM: Jocelyn Wells reports improvement in ROM    Pharmacotherapy Assessment  Analgesic: Tramadol 50 mg twice daily as needed, quantity 60/month    Monitoring: [place] PMP: PDMP reviewed during this encounter.       Pharmacotherapy: No side-effects or adverse reactions reported. Compliance: No problems identified. Effectiveness: Clinically  acceptable.  Newman Pies, RN

## 2022-10-26 DIAGNOSIS — Z992 Dependence on renal dialysis: Secondary | ICD-10-CM | POA: Diagnosis not present

## 2022-10-26 DIAGNOSIS — N186 End stage renal disease: Secondary | ICD-10-CM | POA: Diagnosis not present

## 2022-10-26 DIAGNOSIS — N250 Renal osteodystrophy: Secondary | ICD-10-CM | POA: Diagnosis not present

## 2022-10-26 DIAGNOSIS — N2581 Secondary hyperparathyroidism of renal origin: Secondary | ICD-10-CM | POA: Diagnosis not present

## 2022-10-26 DIAGNOSIS — D631 Anemia in chronic kidney disease: Secondary | ICD-10-CM | POA: Diagnosis not present

## 2022-10-26 DIAGNOSIS — D509 Iron deficiency anemia, unspecified: Secondary | ICD-10-CM | POA: Diagnosis not present

## 2022-10-28 DIAGNOSIS — N250 Renal osteodystrophy: Secondary | ICD-10-CM | POA: Diagnosis not present

## 2022-10-28 DIAGNOSIS — Z992 Dependence on renal dialysis: Secondary | ICD-10-CM | POA: Diagnosis not present

## 2022-10-28 DIAGNOSIS — D631 Anemia in chronic kidney disease: Secondary | ICD-10-CM | POA: Diagnosis not present

## 2022-10-28 DIAGNOSIS — N2581 Secondary hyperparathyroidism of renal origin: Secondary | ICD-10-CM | POA: Diagnosis not present

## 2022-10-28 DIAGNOSIS — D509 Iron deficiency anemia, unspecified: Secondary | ICD-10-CM | POA: Diagnosis not present

## 2022-10-28 DIAGNOSIS — N186 End stage renal disease: Secondary | ICD-10-CM | POA: Diagnosis not present

## 2022-10-29 LAB — TOXASSURE SELECT 13 (MW), URINE

## 2022-10-30 DIAGNOSIS — Z992 Dependence on renal dialysis: Secondary | ICD-10-CM | POA: Diagnosis not present

## 2022-10-30 DIAGNOSIS — N250 Renal osteodystrophy: Secondary | ICD-10-CM | POA: Diagnosis not present

## 2022-10-30 DIAGNOSIS — N186 End stage renal disease: Secondary | ICD-10-CM | POA: Diagnosis not present

## 2022-10-30 DIAGNOSIS — N2581 Secondary hyperparathyroidism of renal origin: Secondary | ICD-10-CM | POA: Diagnosis not present

## 2022-10-30 DIAGNOSIS — D631 Anemia in chronic kidney disease: Secondary | ICD-10-CM | POA: Diagnosis not present

## 2022-10-30 DIAGNOSIS — D509 Iron deficiency anemia, unspecified: Secondary | ICD-10-CM | POA: Diagnosis not present

## 2022-11-02 DIAGNOSIS — N186 End stage renal disease: Secondary | ICD-10-CM | POA: Diagnosis not present

## 2022-11-02 DIAGNOSIS — R0989 Other specified symptoms and signs involving the circulatory and respiratory systems: Secondary | ICD-10-CM | POA: Diagnosis not present

## 2022-11-02 DIAGNOSIS — R059 Cough, unspecified: Secondary | ICD-10-CM | POA: Diagnosis not present

## 2022-11-02 DIAGNOSIS — N250 Renal osteodystrophy: Secondary | ICD-10-CM | POA: Diagnosis not present

## 2022-11-02 DIAGNOSIS — Z992 Dependence on renal dialysis: Secondary | ICD-10-CM | POA: Diagnosis not present

## 2022-11-02 DIAGNOSIS — R5383 Other fatigue: Secondary | ICD-10-CM | POA: Diagnosis not present

## 2022-11-02 DIAGNOSIS — D509 Iron deficiency anemia, unspecified: Secondary | ICD-10-CM | POA: Diagnosis not present

## 2022-11-02 DIAGNOSIS — R0602 Shortness of breath: Secondary | ICD-10-CM | POA: Diagnosis not present

## 2022-11-02 DIAGNOSIS — N2581 Secondary hyperparathyroidism of renal origin: Secondary | ICD-10-CM | POA: Diagnosis not present

## 2022-11-02 DIAGNOSIS — D631 Anemia in chronic kidney disease: Secondary | ICD-10-CM | POA: Diagnosis not present

## 2022-11-03 ENCOUNTER — Other Ambulatory Visit: Payer: Self-pay | Admitting: Family

## 2022-11-03 ENCOUNTER — Other Ambulatory Visit (INDEPENDENT_AMBULATORY_CARE_PROVIDER_SITE_OTHER): Payer: Self-pay | Admitting: Nurse Practitioner

## 2022-11-03 ENCOUNTER — Inpatient Hospital Stay: Payer: Medicare Other | Attending: Oncology

## 2022-11-03 DIAGNOSIS — D75839 Thrombocytosis, unspecified: Secondary | ICD-10-CM | POA: Insufficient documentation

## 2022-11-03 DIAGNOSIS — D500 Iron deficiency anemia secondary to blood loss (chronic): Secondary | ICD-10-CM

## 2022-11-03 DIAGNOSIS — Z1589 Genetic susceptibility to other disease: Secondary | ICD-10-CM

## 2022-11-03 DIAGNOSIS — I739 Peripheral vascular disease, unspecified: Secondary | ICD-10-CM

## 2022-11-03 DIAGNOSIS — D509 Iron deficiency anemia, unspecified: Secondary | ICD-10-CM | POA: Insufficient documentation

## 2022-11-03 DIAGNOSIS — D72829 Elevated white blood cell count, unspecified: Secondary | ICD-10-CM | POA: Insufficient documentation

## 2022-11-03 LAB — CBC WITH DIFFERENTIAL/PLATELET
Abs Immature Granulocytes: 0.03 10*3/uL (ref 0.00–0.07)
Basophils Absolute: 0.1 10*3/uL (ref 0.0–0.1)
Basophils Relative: 1 %
Eosinophils Absolute: 0.2 10*3/uL (ref 0.0–0.5)
Eosinophils Relative: 3 %
HCT: 34.6 % — ABNORMAL LOW (ref 36.0–46.0)
Hemoglobin: 10.5 g/dL — ABNORMAL LOW (ref 12.0–15.0)
Immature Granulocytes: 0 %
Lymphocytes Relative: 11 %
Lymphs Abs: 0.8 10*3/uL (ref 0.7–4.0)
MCH: 25.6 pg — ABNORMAL LOW (ref 26.0–34.0)
MCHC: 30.3 g/dL (ref 30.0–36.0)
MCV: 84.4 fL (ref 80.0–100.0)
Monocytes Absolute: 0.5 10*3/uL (ref 0.1–1.0)
Monocytes Relative: 7 %
Neutro Abs: 5.8 10*3/uL (ref 1.7–7.7)
Neutrophils Relative %: 78 %
Platelets: 367 10*3/uL (ref 150–400)
RBC: 4.1 MIL/uL (ref 3.87–5.11)
RDW: 18.3 % — ABNORMAL HIGH (ref 11.5–15.5)
WBC: 7.4 10*3/uL (ref 4.0–10.5)
nRBC: 0 % (ref 0.0–0.2)

## 2022-11-03 LAB — SAMPLE TO BLOOD BANK

## 2022-11-03 LAB — IRON AND TIBC
Iron: 54 ug/dL (ref 28–170)
Saturation Ratios: 15 % (ref 10.4–31.8)
TIBC: 350 ug/dL (ref 250–450)
UIBC: 296 ug/dL

## 2022-11-03 LAB — FERRITIN: Ferritin: 56 ng/mL (ref 11–307)

## 2022-11-04 DIAGNOSIS — D631 Anemia in chronic kidney disease: Secondary | ICD-10-CM | POA: Diagnosis not present

## 2022-11-04 DIAGNOSIS — Z992 Dependence on renal dialysis: Secondary | ICD-10-CM | POA: Diagnosis not present

## 2022-11-04 DIAGNOSIS — N2581 Secondary hyperparathyroidism of renal origin: Secondary | ICD-10-CM | POA: Diagnosis not present

## 2022-11-04 DIAGNOSIS — N250 Renal osteodystrophy: Secondary | ICD-10-CM | POA: Diagnosis not present

## 2022-11-04 DIAGNOSIS — N186 End stage renal disease: Secondary | ICD-10-CM | POA: Diagnosis not present

## 2022-11-04 DIAGNOSIS — D509 Iron deficiency anemia, unspecified: Secondary | ICD-10-CM | POA: Diagnosis not present

## 2022-11-05 ENCOUNTER — Encounter (INDEPENDENT_AMBULATORY_CARE_PROVIDER_SITE_OTHER): Payer: Medicare Other

## 2022-11-05 ENCOUNTER — Ambulatory Visit (INDEPENDENT_AMBULATORY_CARE_PROVIDER_SITE_OTHER): Payer: Medicare Other | Admitting: Nurse Practitioner

## 2022-11-06 DIAGNOSIS — N186 End stage renal disease: Secondary | ICD-10-CM | POA: Diagnosis not present

## 2022-11-06 DIAGNOSIS — D631 Anemia in chronic kidney disease: Secondary | ICD-10-CM | POA: Diagnosis not present

## 2022-11-06 DIAGNOSIS — Z992 Dependence on renal dialysis: Secondary | ICD-10-CM | POA: Diagnosis not present

## 2022-11-06 DIAGNOSIS — N250 Renal osteodystrophy: Secondary | ICD-10-CM | POA: Diagnosis not present

## 2022-11-06 DIAGNOSIS — D509 Iron deficiency anemia, unspecified: Secondary | ICD-10-CM | POA: Diagnosis not present

## 2022-11-06 DIAGNOSIS — N2581 Secondary hyperparathyroidism of renal origin: Secondary | ICD-10-CM | POA: Diagnosis not present

## 2022-11-08 DIAGNOSIS — Z992 Dependence on renal dialysis: Secondary | ICD-10-CM | POA: Diagnosis not present

## 2022-11-08 DIAGNOSIS — N186 End stage renal disease: Secondary | ICD-10-CM | POA: Diagnosis not present

## 2022-11-09 DIAGNOSIS — Z992 Dependence on renal dialysis: Secondary | ICD-10-CM | POA: Diagnosis not present

## 2022-11-09 DIAGNOSIS — N2581 Secondary hyperparathyroidism of renal origin: Secondary | ICD-10-CM | POA: Diagnosis not present

## 2022-11-09 DIAGNOSIS — D509 Iron deficiency anemia, unspecified: Secondary | ICD-10-CM | POA: Diagnosis not present

## 2022-11-09 DIAGNOSIS — N186 End stage renal disease: Secondary | ICD-10-CM | POA: Diagnosis not present

## 2022-11-09 DIAGNOSIS — D631 Anemia in chronic kidney disease: Secondary | ICD-10-CM | POA: Diagnosis not present

## 2022-11-09 DIAGNOSIS — N250 Renal osteodystrophy: Secondary | ICD-10-CM | POA: Diagnosis not present

## 2022-11-09 DIAGNOSIS — Z23 Encounter for immunization: Secondary | ICD-10-CM | POA: Diagnosis not present

## 2022-11-10 ENCOUNTER — Telehealth: Payer: Self-pay | Admitting: Pulmonary Disease

## 2022-11-10 NOTE — Telephone Encounter (Signed)
Spoke to patient and scheduled appt for 11/29/2019 at 11;30 for oxygen re cert. Nothing further needed.

## 2022-11-10 NOTE — Telephone Encounter (Signed)
Patient is calling because [facility] needs a new prescription of her oxygen.

## 2022-11-10 NOTE — Telephone Encounter (Signed)
Order placed to [facility] 01/2022. Is this a re cert?

## 2022-11-10 NOTE — Telephone Encounter (Signed)
I would say yes because we originally ordered 02 12/2021. Need new 02 order I guess

## 2022-11-11 DIAGNOSIS — Z992 Dependence on renal dialysis: Secondary | ICD-10-CM | POA: Diagnosis not present

## 2022-11-11 DIAGNOSIS — N250 Renal osteodystrophy: Secondary | ICD-10-CM | POA: Diagnosis not present

## 2022-11-11 DIAGNOSIS — Z23 Encounter for immunization: Secondary | ICD-10-CM | POA: Diagnosis not present

## 2022-11-11 DIAGNOSIS — N186 End stage renal disease: Secondary | ICD-10-CM | POA: Diagnosis not present

## 2022-11-11 DIAGNOSIS — D631 Anemia in chronic kidney disease: Secondary | ICD-10-CM | POA: Diagnosis not present

## 2022-11-11 DIAGNOSIS — D509 Iron deficiency anemia, unspecified: Secondary | ICD-10-CM | POA: Diagnosis not present

## 2022-11-13 DIAGNOSIS — N186 End stage renal disease: Secondary | ICD-10-CM | POA: Diagnosis not present

## 2022-11-13 DIAGNOSIS — D631 Anemia in chronic kidney disease: Secondary | ICD-10-CM | POA: Diagnosis not present

## 2022-11-13 DIAGNOSIS — Z23 Encounter for immunization: Secondary | ICD-10-CM | POA: Diagnosis not present

## 2022-11-13 DIAGNOSIS — D509 Iron deficiency anemia, unspecified: Secondary | ICD-10-CM | POA: Diagnosis not present

## 2022-11-13 DIAGNOSIS — N250 Renal osteodystrophy: Secondary | ICD-10-CM | POA: Diagnosis not present

## 2022-11-13 DIAGNOSIS — Z992 Dependence on renal dialysis: Secondary | ICD-10-CM | POA: Diagnosis not present

## 2022-11-16 DIAGNOSIS — D509 Iron deficiency anemia, unspecified: Secondary | ICD-10-CM | POA: Diagnosis not present

## 2022-11-16 DIAGNOSIS — N186 End stage renal disease: Secondary | ICD-10-CM | POA: Diagnosis not present

## 2022-11-16 DIAGNOSIS — N250 Renal osteodystrophy: Secondary | ICD-10-CM | POA: Diagnosis not present

## 2022-11-16 DIAGNOSIS — Z23 Encounter for immunization: Secondary | ICD-10-CM | POA: Diagnosis not present

## 2022-11-16 DIAGNOSIS — Z992 Dependence on renal dialysis: Secondary | ICD-10-CM | POA: Diagnosis not present

## 2022-11-16 DIAGNOSIS — D631 Anemia in chronic kidney disease: Secondary | ICD-10-CM | POA: Diagnosis not present

## 2022-11-17 ENCOUNTER — Encounter: Payer: Self-pay | Admitting: Cardiology

## 2022-11-17 ENCOUNTER — Ambulatory Visit: Payer: Medicare Other | Attending: Cardiology | Admitting: Cardiology

## 2022-11-17 VITALS — BP 139/78 | HR 76 | Ht 66.0 in | Wt 202.0 lb

## 2022-11-17 DIAGNOSIS — I639 Cerebral infarction, unspecified: Secondary | ICD-10-CM | POA: Diagnosis not present

## 2022-11-17 DIAGNOSIS — J449 Chronic obstructive pulmonary disease, unspecified: Secondary | ICD-10-CM | POA: Diagnosis not present

## 2022-11-17 DIAGNOSIS — E785 Hyperlipidemia, unspecified: Secondary | ICD-10-CM | POA: Diagnosis not present

## 2022-11-17 DIAGNOSIS — I428 Other cardiomyopathies: Secondary | ICD-10-CM | POA: Insufficient documentation

## 2022-11-17 DIAGNOSIS — E1169 Type 2 diabetes mellitus with other specified complication: Secondary | ICD-10-CM | POA: Insufficient documentation

## 2022-11-17 DIAGNOSIS — N186 End stage renal disease: Secondary | ICD-10-CM | POA: Insufficient documentation

## 2022-11-17 DIAGNOSIS — I5032 Chronic diastolic (congestive) heart failure: Secondary | ICD-10-CM | POA: Diagnosis not present

## 2022-11-17 DIAGNOSIS — I739 Peripheral vascular disease, unspecified: Secondary | ICD-10-CM | POA: Insufficient documentation

## 2022-11-17 DIAGNOSIS — I82511 Chronic embolism and thrombosis of right femoral vein: Secondary | ICD-10-CM | POA: Insufficient documentation

## 2022-11-17 DIAGNOSIS — Z992 Dependence on renal dialysis: Secondary | ICD-10-CM | POA: Diagnosis not present

## 2022-11-17 DIAGNOSIS — D631 Anemia in chronic kidney disease: Secondary | ICD-10-CM | POA: Diagnosis not present

## 2022-11-17 NOTE — Patient Instructions (Signed)
Medication Instructions:  Your physician recommends that you continue on your current medications as directed. Please refer to the Current Medication list given to you today.   *If you need a refill on your cardiac medications before your next appointment, please call your pharmacy*   Lab Work: No labs ordered today    Testing/Procedures: No test ordered today    Follow-Up: At [facility], you and your health needs are our priority.  As part of our continuing mission to provide you with exceptional heart care, we have created designated Provider Care Teams.  These Care Teams include your primary Cardiologist (physician) and Advanced Practice Providers (APPs -  Physician Assistants and Nurse Practitioners) who all work together to provide you with the care you need, when you need it.  We recommend signing up for the patient portal called "MyChart".  Sign up information is provided on this After Visit Summary.  MyChart is used to connect with patients for Virtual Visits (Telemedicine).  Patients are able to view lab/test results, encounter notes, upcoming appointments, etc.  Non-urgent messages can be sent to your provider as well.   To learn more about what you can do with MyChart, go to [email or web address].    Your next appointment:   4 month(s)  Provider:   Charlsie Quest, NP

## 2022-11-17 NOTE — Progress Notes (Signed)
Cardiology Office Note:  .   Date:  11/17/2022  ID:  Jocelyn Wells, DOB 23-Nov-1955, MRN [id number] PCP: Jacky Kindle, FNP  [facility] Providers Cardiologist:  None    History of Present Illness: Marland Kitchen   Jocelyn Wells is a 67 y.o. female with past medical history of anxiety, HFpEF, nonischemic cardiomyopathy, peripheral arterial disease, hypertension, diabetes, former smoker, COPD, CVA, chronic DVT on OAC, CKD, lumbar spinal stenosis, severe back pain, who is here today for follow-up.  Diagnosis systolic congestive heart failure twice a day with an EF of 45% that normalized to 60-65% 2019.  CVA due to thrombosis of the left middle cerebral artery status post ILR placement in 2017.  Chronic anticoagulant due to DVT.  Echocardiogram completed 09/03/2022 revealed an LVEF of 50-55%, G1 DD, without valvular abnormalities noted.    08/2021 stent placement to the left superficial femoral artery, popliteal, percutaneous transluminal angioplasty of left common iliac artery, percutaneous angioplasty of the left peroneal artery and mechanical thrombectomy of the left tibioperoneal trunk.  Hospital course was complicated by acute renal failure requiring temporary dialysis.  She subsequently returned to her baseline stage IV CKD.  Was admitted to [facility] 01/01/2022 with shortness of breath.  Hemoglobin is noted to be 6.7.  She was transfused and diuresed with furosemide.  She did follow with nephrology for AKI.  She was able to be discharged on 01/04/2022.  Outpatient colonoscopy and EGD in January 2024 continue to be on iron infusions.  She follows with vascular for evaluation of left brachiocephalic fistula placement.  AV fistula was placed on 06/18/2022.  She tolerated procedure well without any immediate complications.  She was last seen in clinic 10/01/2022.  Blood pressure has been running slightly low and she been feeling more fatigue since starting dialysis.  Previously she had been advised to stop taking  her carvedilol unfortunately she continued to take it twice a day.  So there was some confusion on her medications.  She returns to clinic today stating that overall she has been doing fairly well.  She does have an increased amount of fatigue that she relates to her hemodialysis.  She has hemodialysis on Tuesday, Thursday, and Saturday.  At her last visit she was found to be hypotensive and her carvedilol had to be stopped.  Today her blood pressure is stable.  She states that she still continues to have several mechanical falls which is concerning because she is on anticoagulant for DVT.  She denies any bleeding with no blood noted in her urine or stool.  She does continue to follow with hematology for iron deficiency anemia.  She has recently had extensive GI workup.  She denies any chest pain, shortness of breath, palpitations, or lightheadedness or dizziness.  She continues to wear 2 L of oxygen via nasal cannula at night.  She also continues to follow with advanced heart failure, vascular, pulmonary, hematology, and GI.  ROS: 10 point review of systems has been reviewed and considered negative with exception was been listed in the HPI  Studies Reviewed: Marland Kitchen       TTE 09/03/22 1. Left ventricular ejection fraction, by estimation, is 50 to 55%. The  left ventricle has low normal function. Unable to exclude regional wall  motion abnormailty in the setting of bundle branch block, possible mild  inferior wall hypokinesis (difficult  to visualize, definity not used). There is moderate left ventricular  hypertrophy. Left ventricular diastolic parameters are consistent with  Grade I  diastolic dysfunction (impaired relaxation).   2. Right ventricular systolic function is normal. The right ventricular  size is normal. Mildly increased right ventricular wall thickness. There  is mildly elevated pulmonary artery systolic pressure. The estimated right  ventricular systolic pressure is  38.2 mmHg.   3.  Left atrial size was moderately dilated.   4. The mitral valve is normal in structure. No evidence of mitral valve  regurgitation. No evidence of mitral stenosis.   5. The aortic valve has an indeterminant number of cusps. Aortic valve  regurgitation is not visualized. Aortic valve sclerosis is present, with  no evidence of aortic valve stenosis.   6. The inferior vena cava is normal in size with greater than 50%  respiratory variability, suggesting right atrial pressure of 3 mmHg.  Risk Assessment/Calculations:                 Physical Exam:   VS:  BP 139/78 (BP Location: Right Arm, Patient Position: Sitting, Cuff Size: Normal)   Pulse 76   Ht 5\' 6"  (1.676 m)   Wt 202 lb (91.6 kg)   SpO2 91%   BMI 32.60 kg/m    Wt Readings from Last 3 Encounters:  11/17/22 202 lb (91.6 kg)  10/25/22 195 lb (88.5 kg)  10/12/22 198 lb 9.8 oz (90.1 kg)    GEN: Well nourished, well developed in no acute distress NECK: No JVD; No carotid bruits CARDIAC: RRR, no murmurs, rubs, gallops RESPIRATORY:  Clear to auscultation without rales, wheezing or rhonchi  ABDOMEN: Soft, non-tender, non-distended EXTREMITIES:  1+ edema; No deformity   ASSESSMENT AND PLAN: .   HFimpEF/nonischemic cardiomyopathy with last LVEF of 50-55% on echocardiogram completed 09/03/2022.  She is currently not on ACE/ARB/ARNI/MRA/SGLT2 inhibitor today end-stage renal disease.  She was previously on her carvedilol which had to be stopped due to extreme hypotension.  She is continued on torsemide 20 mg daily.  She has been encouraged to continue to follow-up with advanced heart failure.  She has also been advised to watch her fluid and sodium intake and continue to weigh herself daily.  Peripheral arterial disease where she underwent percutaneous stent placement of left SFA.  Her hospital stay was complicated by AKI requiring dialysis during hospitalization and she had recently had AV fistula placed by [facility] for end-stage renal disease  where she is now on hemodialysis.  She is continued on apixaban and lieu of aspirin and statin therapy.  Mixed hyperlipidemia associated with type 2 diabetes which she remains on atorvastatin 40 mg daily and insulin pump.  This continues to be followed by her PCP.  Chronic DVT of the femoral vein on the right lower extremity she has been maintained on chronic anticoagulation with apixaban 5 mg twice daily.  Reached out to pharmacy as there was a previous question of a decrease in dosing but with patient's chronic DVT she is being maintained on 5 mg twice daily versus reduced dosing to offer adequate protection.  Anemia of chronic disease with a hemoglobin of 10.5.  She continues to be followed by hematology with routine iron infusions.  End-stage renal disease now on hemodialysis on Tuesday, Thursday, Saturday.  Patient states that she has been tolerating dialysis well but she is upset that her time was changed as she was early morning it was done by lunch and she has gotten pushed to the afternoon.  This continues to be managed by nephrology as well as the majority of her fluid removal.  COPD which  is longstanding.  She is continued on her regular pulmonary medications as ordered as well as 2 L of O2 via nasal cannula at night.  This continues to be followed by pulmonary with Dr. Jayme Cloud.  History of cryptogenic stroke in the setting of Nonischemic cardiomyopathy.  She was followed by EP.  She previously had a loop recorder that showed no intercurrent atrial fibrillation or flutter.       Dispo: Patient to return to clinic to see MD/APP in 4 months or sooner if needed for reevaluation of symptoms  Signed, Natsuko Kelsay, NP

## 2022-11-18 ENCOUNTER — Encounter (INDEPENDENT_AMBULATORY_CARE_PROVIDER_SITE_OTHER): Payer: Medicare Other

## 2022-11-18 ENCOUNTER — Encounter: Payer: Self-pay | Admitting: Oncology

## 2022-11-18 DIAGNOSIS — D509 Iron deficiency anemia, unspecified: Secondary | ICD-10-CM | POA: Diagnosis not present

## 2022-11-18 DIAGNOSIS — D631 Anemia in chronic kidney disease: Secondary | ICD-10-CM | POA: Diagnosis not present

## 2022-11-18 DIAGNOSIS — N250 Renal osteodystrophy: Secondary | ICD-10-CM | POA: Diagnosis not present

## 2022-11-18 DIAGNOSIS — Z992 Dependence on renal dialysis: Secondary | ICD-10-CM | POA: Diagnosis not present

## 2022-11-18 DIAGNOSIS — Z23 Encounter for immunization: Secondary | ICD-10-CM | POA: Diagnosis not present

## 2022-11-18 DIAGNOSIS — N186 End stage renal disease: Secondary | ICD-10-CM | POA: Diagnosis not present

## 2022-11-19 ENCOUNTER — Encounter (INDEPENDENT_AMBULATORY_CARE_PROVIDER_SITE_OTHER): Payer: Medicare Other

## 2022-11-20 DIAGNOSIS — Z992 Dependence on renal dialysis: Secondary | ICD-10-CM | POA: Diagnosis not present

## 2022-11-20 DIAGNOSIS — D509 Iron deficiency anemia, unspecified: Secondary | ICD-10-CM | POA: Diagnosis not present

## 2022-11-20 DIAGNOSIS — N186 End stage renal disease: Secondary | ICD-10-CM | POA: Diagnosis not present

## 2022-11-20 DIAGNOSIS — D631 Anemia in chronic kidney disease: Secondary | ICD-10-CM | POA: Diagnosis not present

## 2022-11-20 DIAGNOSIS — N250 Renal osteodystrophy: Secondary | ICD-10-CM | POA: Diagnosis not present

## 2022-11-20 DIAGNOSIS — Z23 Encounter for immunization: Secondary | ICD-10-CM | POA: Diagnosis not present

## 2022-11-24 ENCOUNTER — Encounter: Payer: Medicare Other | Admitting: Family

## 2022-11-25 DIAGNOSIS — N250 Renal osteodystrophy: Secondary | ICD-10-CM | POA: Diagnosis not present

## 2022-11-25 DIAGNOSIS — D509 Iron deficiency anemia, unspecified: Secondary | ICD-10-CM | POA: Diagnosis not present

## 2022-11-25 DIAGNOSIS — D631 Anemia in chronic kidney disease: Secondary | ICD-10-CM | POA: Diagnosis not present

## 2022-11-25 DIAGNOSIS — Z992 Dependence on renal dialysis: Secondary | ICD-10-CM | POA: Diagnosis not present

## 2022-11-25 DIAGNOSIS — Z23 Encounter for immunization: Secondary | ICD-10-CM | POA: Diagnosis not present

## 2022-11-25 DIAGNOSIS — N186 End stage renal disease: Secondary | ICD-10-CM | POA: Diagnosis not present

## 2022-11-26 ENCOUNTER — Ambulatory Visit (INDEPENDENT_AMBULATORY_CARE_PROVIDER_SITE_OTHER): Payer: Medicare Other | Admitting: Nurse Practitioner

## 2022-11-27 DIAGNOSIS — N186 End stage renal disease: Secondary | ICD-10-CM | POA: Diagnosis not present

## 2022-11-27 DIAGNOSIS — Z992 Dependence on renal dialysis: Secondary | ICD-10-CM | POA: Diagnosis not present

## 2022-11-27 DIAGNOSIS — N250 Renal osteodystrophy: Secondary | ICD-10-CM | POA: Diagnosis not present

## 2022-11-27 DIAGNOSIS — D631 Anemia in chronic kidney disease: Secondary | ICD-10-CM | POA: Diagnosis not present

## 2022-11-27 DIAGNOSIS — Z23 Encounter for immunization: Secondary | ICD-10-CM | POA: Diagnosis not present

## 2022-11-27 DIAGNOSIS — D509 Iron deficiency anemia, unspecified: Secondary | ICD-10-CM | POA: Diagnosis not present

## 2022-11-29 ENCOUNTER — Encounter: Payer: Self-pay | Admitting: Pulmonary Disease

## 2022-11-29 ENCOUNTER — Telehealth: Payer: Self-pay | Admitting: Cardiology

## 2022-11-29 ENCOUNTER — Ambulatory Visit (INDEPENDENT_AMBULATORY_CARE_PROVIDER_SITE_OTHER): Payer: Medicare Other | Admitting: Pulmonary Disease

## 2022-11-29 ENCOUNTER — Ambulatory Visit: Payer: Medicare Other | Admitting: Pulmonary Disease

## 2022-11-29 VITALS — BP 120/76 | HR 73 | Temp 97.1°F | Ht 66.0 in | Wt 202.0 lb

## 2022-11-29 DIAGNOSIS — G4734 Idiopathic sleep related nonobstructive alveolar hypoventilation: Secondary | ICD-10-CM

## 2022-11-29 DIAGNOSIS — I5032 Chronic diastolic (congestive) heart failure: Secondary | ICD-10-CM | POA: Diagnosis not present

## 2022-11-29 DIAGNOSIS — N184 Chronic kidney disease, stage 4 (severe): Secondary | ICD-10-CM

## 2022-11-29 DIAGNOSIS — N186 End stage renal disease: Secondary | ICD-10-CM | POA: Diagnosis not present

## 2022-11-29 DIAGNOSIS — J449 Chronic obstructive pulmonary disease, unspecified: Secondary | ICD-10-CM | POA: Diagnosis not present

## 2022-11-29 DIAGNOSIS — D631 Anemia in chronic kidney disease: Secondary | ICD-10-CM | POA: Diagnosis not present

## 2022-11-29 MED ORDER — APIXABAN 5 MG PO TABS: 5.0000 mg | ORAL_TABLET | Freq: Two times a day (BID) | ORAL | 2 refills | Status: DC

## 2022-11-29 MED ORDER — STIOLTO RESPIMAT 2.5-2.5 MCG/ACT IN AERS: 2.0000 | INHALATION_SPRAY | Freq: Every day | RESPIRATORY_TRACT | 0 refills | Status: DC

## 2022-11-29 NOTE — Patient Instructions (Signed)
VISIT SUMMARY:  During your recent visit, we discussed your ongoing health conditions, including COPD, obesity hypoventilation syndrome, and chronic diastolic dysfunction. We also noted that your kidney disease is being managed with dialysis. Your primary concern was shortness of breath during physical activity. We have made some changes to your treatment plan to help manage your symptoms better.  YOUR PLAN:  -COPD: COPD, or Chronic Obstructive Pulmonary Disease, is a lung disease that makes it hard to breathe. We discussed the benefits of a daily inhaler to improve your lung function and reduce symptoms. We will start you on Stiolto Respimat, which you will take two puffs once daily. We will provide samples for you to try before sending the prescription to your pharmacy.  -OBESITY HYPOVENTILATION SYNDROME: Obesity Hypoventilation Syndrome is a breathing disorder that affects some people who have been diagnosed with obesity. We discussed the importance of your nighttime oxygen therapy, which helps your heart and prevents further complications. We will order a recertification for your nocturnal oxygen therapy and a nocturnal oximetry test to confirm the continued need for this therapy.  INSTRUCTIONS:  Please continue with your current treatment plan, including your dialysis schedule and nighttime oxygen therapy. Start using the Stiolto Respimat inhaler as directed. We will follow up in 3-4 months to assess your progress and make any necessary adjustments to your treatment plan.

## 2022-11-29 NOTE — Telephone Encounter (Signed)
Pt c/o medication issue:  1. Name of Medication:   apixaban (ELIQUIS) 5 MG TABS tablet    2. How are you currently taking this medication (dosage and times per day)? N/A  3. Are you having a reaction (difficulty breathing--STAT)? No   4. What is your medication issue? Patient states Sherri made changes to this medication at her last appt but the pharmacy has not received the new prescription for it.

## 2022-11-29 NOTE — Progress Notes (Signed)
Subjective:    Patient ID: Jocelyn Wells, female    DOB: [birth year], 67 y.o.   MRN: [id number]  Patient Care Team: Jacky Kindle, FNP as PCP - General (Family Medicine) Tedd Sias, Marlana Salvage, MD as Physician Assistant (Endocrinology) Duke Salvia, MD as Consulting Physician (Cardiology) Brandy Hale, MD (Inactive) as Referring Physician (Psychiatry) Deirdre Evener, MD as Consulting Physician (Dermatology) Dimmig, Maisie Fus, MD as Referring Physician (Orthopedic Surgery) Schnier, Latina Craver, MD (Vascular Surgery) Albin Felling, OD (Optometry) Gaspar Cola, [facility] (Inactive) (Pharmacist) Salena Saner, MD as Consulting Physician (Pulmonary Disease) Juanell Fairly, RN as Case Manager Creig Hines, MD as Consulting Physician (Oncology)  Chief Complaint  Patient presents with   Follow-up    DOE. Some wheezing and cough with yellow sputum.     BACKGROUND/INTERVAL: 67 year old former smoker with a history as noted below presents for follow-up on COPD, obesity with obesity hypoventilation and hypoxemia.  Last seen here on 29 January 2022.  Patient presents today for scheduled visit.  Needs reevaluation and recertification for nocturnal oxygen use.  Since her prior visit she is now on dialysis Tuesday, Thursdays and Saturdays having started this in May 2024.  No hospitalizations since last visit.  HPI Discussed the use of AI scribe software for clinical note transcription with the patient, who gave verbal consent to proceed.  History of Present Illness   The 67 year old patient with a history of COPD, obesity with obesity hyperventilation syndrome, and chronic diastolic dysfunction presents for a follow-up visit. The patient's primary concern is dyspnea on exertion. She was last seen in December of the previous year.  The patient's son manages her medications, and he believes her regimen is mostly settled. She has been undergoing dialysis, which has resulted in less swelling than  previously observed. The patient's heart and kidney issues are being managed, and she reports occasional wheezing and coughing, but not consistently. She has not had to use her emergency albuterol inhaler and is not on any daily maintenance inhaler.  The patient is up to date on her flu vaccine and undergoes dialysis on Tuesdays, Thursdays, and Saturdays. Despite her health issues, the patient's condition has improved significantly since her last visit. However, she still requires a walker for mobility.  The patient is also on nighttime oxygen therapy, which she confirms helps her. The oxygen supply company recently contacted her for a new prescription. She has quit smoking, which is a significant step towards better respiratory health.     Review of Systems A 10 point review of systems was performed and it is as noted above otherwise negative.   Patient Active Problem List   Diagnosis Date Noted   Chronic painful diabetic neuropathy (HCC) 10/25/2022   Encounter for screening mammogram for malignant neoplasm of breast 06/01/2022   Encounter for subsequent annual wellness visit (AWV) in Medicare patient 06/01/2022   Atherosclerosis of native arteries of extremity with intermittent claudication (HCC) 05/03/2022   Polyp of ascending colon 02/15/2022   Symptomatic anemia 01/01/2022   Foot pain, right 12/22/2021   Pain of right lower extremity 12/22/2021   Iron deficiency anemia 12/17/2021   Personal history of nicotine dependence 12/04/2021   Leukocytosis 12/04/2021   Long toenail 12/04/2021   Screening for colon cancer 12/04/2021   Depression, recurrent (HCC) 12/04/2021   Hospital discharge follow-up 09/02/2021   Hyperkalemia 08/15/2021   Acute on chronic diastolic CHF (congestive heart failure) (HCC) 08/14/2021   Atherosclerosis of native arteries of the extremities  with ulceration (HCC) 08/12/2021   Positive depression screening 07/14/2021   DOE (dyspnea on exertion) 07/14/2021    Bilateral leg edema 07/14/2021   Frequent falls 04/13/2021   Anemia in chronic kidney disease 03/18/2021   Heart failure, unspecified (HCC) 03/18/2021   Hyperparathyroidism due to renal insufficiency (HCC) 03/18/2021   Chronic depression 03/18/2021   Overweight 03/18/2021   Poor appetite 02/13/2021   MDD (major depressive disorder), recurrent episode, mild (HCC) 02/13/2021   Benign hypertensive kidney disease with chronic kidney disease 10/14/2020   CKD (chronic kidney disease), stage IV (HCC) 05/31/2020   Compression fracture of L1 lumbar vertebra (HCC) 01/30/2019   Compression fracture of body of thoracic vertebra (HCC) 01/30/2019   Lumbar foraminal stenosis (RIGHT L1) 01/30/2019   Spinal stenosis of lumbar region with neurogenic claudication (L4-L5) 01/30/2019   Osteoporosis, post-menopausal 04/14/2018   Vitamin D deficiency 04/14/2018   Chronic deep vein thrombosis (DVT) (HCC) 02/09/2018   Post-menopausal 11/17/2017   Aortic atherosclerosis (HCC) 11/08/2017   Snoring 04/24/2015   Sleep paralysis, recurrent isolated 04/24/2015   Hypersomnia with sleep apnea 04/24/2015   Cataplexy 04/24/2015   Morbid obesity due to excess calories (HCC) 04/24/2015   COPD (chronic obstructive pulmonary disease) (HCC) 04/24/2015   Embolic stroke (HCC) 04/23/2015   Acute renal failure superimposed on stage 4 chronic kidney disease (HCC) 01/29/2015   Dizziness 07/05/2014   Essential hypertension 07/05/2014   Carbuncle and furuncle 07/05/2014   Pain of perianal area 07/05/2014   Guttate psoriasis 07/05/2014   H/O: osteoarthritis 06/05/2014   Anxiety, generalized 06/05/2014   Microalbuminuria 12/23/2013   Long term current use of insulin (HCC) 12/23/2013   Type 2 diabetes mellitus with other diabetic kidney complication (HCC) 12/23/2013   Disturbance of skin sensation 09/02/2006   Difficulty hearing 07/25/2006   Cephalalgia 05/27/2006   Hyperlipidemia associated with type 2 diabetes mellitus  (HCC) 10/22/2005   Arthritis, degenerative 10/22/2005    Social History   Tobacco Use   Smoking status: Former    Current packs/day: 0.00    Average packs/day: 0.5 packs/day for 48.3 years (24.2 ttl pk-yrs)    Types: Cigarettes    Start date: 02/08/1974    Quit date: 05/31/2022    Years since quitting: 0.4   Smokeless tobacco: Never  Substance Use Topics   Alcohol use: No    Alcohol/week: 0.0 standard drinks of alcohol    Allergies  Allergen Reactions   Codeine Nausea And Vomiting   Ivp Dye [Iodinated Contrast Media]     Patients states the IV Dye shuts her kidneys down    Current Meds  Medication Sig   albuterol (VENTOLIN HFA) 108 (90 Base) MCG/ACT inhaler TAKE 2 PUFFS BY MOUTH EVERY 6 HOURS AS NEEDED FOR WHEEZE OR SHORTNESS OF BREATH (Patient taking differently: Inhale 2 puffs into the lungs every 6 (six) hours as needed for wheezing or shortness of breath.)   apixaban (ELIQUIS) 5 MG TABS tablet Take 1 tablet (5 mg total) by mouth 2 (two) times daily.   atorvastatin (LIPITOR) 40 MG tablet Take 1 tablet (40 mg total) by mouth daily.   buPROPion (WELLBUTRIN SR) 150 MG 12 hr tablet TAKE 1 TABLET BY MOUTH TWICE A DAY   calcitRIOL (ROCALTROL) 0.25 MCG capsule Take 0.25 mcg by mouth daily.   Dulaglutide (TRULICITY) 4.5 MG/0.5ML SOPN Inject 4.5 mg into the skin every 7 (seven) days. (Patient taking differently: Inject 4.5 mg into the skin every 7 (seven) days. Mondays)   fluticasone (FLONASE) 50 MCG/ACT  nasal spray Place 2 sprays into both nostrils daily. (Patient taking differently: Place 2 sprays into both nostrils as needed.)   gabapentin (NEURONTIN) 300 MG capsule Take 300 mg by mouth daily.   glucose blood test strip    hydroxyurea (HYDREA) 500 MG capsule Take 1 capsule (500 mg total) by mouth every other day. May take with food to minimize GI side effects.   insulin aspart (NOVOLOG) 100 UNIT/ML injection Insulin pump   Insulin Disposable Pump (OMNIPOD DASH PODS, GEN 4,) MISC USE  1 POD EACH EVERY 72    HOURS   Insulin Human (INSULIN PUMP) SOLN Per endocrinoloy   loratadine (CLARITIN) 10 MG tablet Take 1 tablet by mouth daily.   meclizine (ANTIVERT) 12.5 MG tablet Take 1 tablet (12.5 mg total) by mouth 3 (three) times daily as needed for dizziness.   OXYGEN Place 2 L into the nose continuous.   Spacer/Aero-Holding [facility] DIAMOND) MISC Use with inhaler to ensure medication is delivered throughout lungs   torsemide (DEMADEX) 20 MG tablet TAKE 3 TABLETS BY MOUTH EVERY DAY   traMADol (ULTRAM) 50 MG tablet Take 1 tablet (50 mg total) by mouth every 12 (twelve) hours as needed for severe pain.   Vitamin D, Ergocalciferol, (DRISDOL) 1.25 MG (50000 UNIT) CAPS capsule Take 50,000 Units by mouth every 7 (seven) days. Sunday    Immunization History  Administered Date(s) Administered   Fluad Quad(high Dose 65+) 01/07/2021, 11/23/2021   Fluad Trivalent(High Dose 65+) 11/09/2022   Influenza Inj Mdck Quad Pf 12/20/2018   Influenza Split 01/05/2006, 10/31/2007, 03/03/2009, 12/16/2009, 12/23/2010, 12/24/2011, 12/19/2013   Influenza,inj,Quad PF,6+ Mos 11/23/2012, 11/07/2015, 11/15/2017   Influenza-Unspecified 11/07/2015, 11/03/2016   PFIZER(Purple Top)SARS-COV-2 Vaccination 03/01/2019, 03/22/2019   Pneumococcal Polysaccharide-23 12/23/2010   Zoster Recombinant(Shingrix) 06/03/2016, 12/11/2016        Objective:     BP 120/76 (BP Location: Right Arm, Cuff Size: Normal)   Pulse 73   Temp (!) 97.1 F (36.2 C)   Ht 5\' 6"  (1.676 m)   Wt 202 lb (91.6 kg) Comment: per the patient. in a wheelchair today  SpO2 93%   BMI 32.60 kg/m   SpO2: 93 % O2 Device: None (Room air)  GENERAL: Obese woman, presents in transport chair.  No respiratory distress.  No conversational dyspnea. HEAD: Normocephalic, atraumatic.  EYES: Pupils equal, round, reactive to light.  No scleral icterus.  MOUTH: Edentulous, oral mucosa moist.  No thrush. NECK: Supple. No thyromegaly. Trachea  midline. No JVD.  No adenopathy. PULMONARY: Good air entry bilaterally.  No adventitious sounds. CARDIOVASCULAR: S1 and S2. Regular rate and rhythm.  AV fistula left arm, palpable thrill. ABDOMEN: Obese otherwise benign. MUSCULOSKELETAL: No joint deformity, no clubbing, this edema of the lower extremities.  NEUROLOGIC: Grossly nonfocal. SKIN: Intact,warm,dry.  Chronic stasis changes lower extremities no wounds. PSYCH: Flat affect though more interactive today.   Assessment & Plan:     ICD-10-CM   1. Stage 2 moderate COPD by GOLD classification (HCC)  J44.9 Overnight Pulse Oximetry Study   Trial of Stiolto 2 puffs daily Continue as needed albuterol    2. Nocturnal hypoxemia  G47.34    Reevaluate for nocturnal oxygen Patient compliant with therapy Patient notes benefit of therapy    3. Chronic diastolic heart failure (HCC)  Z61.09     4. Anemia of chronic kidney failure, stage 4 (severe) (HCC)  N18.4    D63.1     5. ESRD (end stage renal disease) (HCC)  N18.6  Dialysis Tuesday Thursday Saturday     Orders Placed This Encounter  Procedures   Overnight Pulse Oximetry Study    On room air DME: [employer]    Standing Status:   Future    Standing Expiration Date:   11/29/2023   Meds ordered this encounter  Medications   Tiotropium Bromide-Olodaterol (STIOLTO RESPIMAT) 2.5-2.5 MCG/ACT AERS    Sig: Inhale 2 puffs into the lungs daily.    Dispense:  8 g    Refill:  0    Order Specific Question:   Lot Number?    Answer:   [id number]    Order Specific Question:   Expiration Date?    Answer:   06/08/2024    Order Specific Question:   Quantity    Answer:   2   Assessment and Plan    COPD Infrequent use of Albuterol. No daily maintenance inhaler. Discussed the benefits of a daily maintenance inhaler to improve lung function and reduce symptoms. -Start Stiolto Respimat (two puffs once daily). -Provide samples for trial before sending prescription to pharmacy.  Obesity  Hypoventilation Syndrome Currently on nocturnal oxygen therapy. Discussed the importance of oxygen therapy in unloading the heart and preventing further complications. -Order recertification for nocturnal oxygen therapy. -Order nocturnal oximetry to confirm continued need for oxygen therapy.  Chronic Diastolic Dysfunction Managed by cardiology. No acute issues reported.  Chronic Kidney Disease Now on dialysis. No acute issues reported.  General Health Maintenance -Up to date on flu vaccine. -Follow-up in 3-4 months.      Gailen Shelter, MD Advanced Bronchoscopy PCCM [facility]    *This note was dictated using voice recognition software/Dragon.  Despite best efforts to proofread, errors can occur which can change the meaning. Any transcriptional errors that result from this process are unintentional and may not be fully corrected at the time of dictation.

## 2022-11-29 NOTE — Telephone Encounter (Signed)
Spoke w/ pt.  She states that it was discussed at last ov that her eliquis dosage would be reduced, but per her last ov on 11/17/22:     Chronic DVT of the femoral vein on the right lower extremity she has been maintained on chronic anticoagulation with apixaban 5 mg twice daily.  Reached out to pharmacy as there was a previous question of a decrease in dosing but with patient's chronic DVT she is being maintained on 5 mg twice daily versus reduced dosing to offer adequate protection.   Advise her to continue current dosage of eliquis 5 mg BID.  She requests a refill be sent in to mail order. She verbalizes understanding and is appreciative of the call.

## 2022-11-30 DIAGNOSIS — Z992 Dependence on renal dialysis: Secondary | ICD-10-CM | POA: Diagnosis not present

## 2022-11-30 DIAGNOSIS — N186 End stage renal disease: Secondary | ICD-10-CM | POA: Diagnosis not present

## 2022-11-30 DIAGNOSIS — N250 Renal osteodystrophy: Secondary | ICD-10-CM | POA: Diagnosis not present

## 2022-11-30 DIAGNOSIS — Z23 Encounter for immunization: Secondary | ICD-10-CM | POA: Diagnosis not present

## 2022-11-30 DIAGNOSIS — D631 Anemia in chronic kidney disease: Secondary | ICD-10-CM | POA: Diagnosis not present

## 2022-11-30 DIAGNOSIS — D509 Iron deficiency anemia, unspecified: Secondary | ICD-10-CM | POA: Diagnosis not present

## 2022-12-02 DIAGNOSIS — D509 Iron deficiency anemia, unspecified: Secondary | ICD-10-CM | POA: Diagnosis not present

## 2022-12-02 DIAGNOSIS — Z23 Encounter for immunization: Secondary | ICD-10-CM | POA: Diagnosis not present

## 2022-12-02 DIAGNOSIS — N250 Renal osteodystrophy: Secondary | ICD-10-CM | POA: Diagnosis not present

## 2022-12-02 DIAGNOSIS — Z992 Dependence on renal dialysis: Secondary | ICD-10-CM | POA: Diagnosis not present

## 2022-12-02 DIAGNOSIS — D631 Anemia in chronic kidney disease: Secondary | ICD-10-CM | POA: Diagnosis not present

## 2022-12-02 DIAGNOSIS — N186 End stage renal disease: Secondary | ICD-10-CM | POA: Diagnosis not present

## 2022-12-04 DIAGNOSIS — N250 Renal osteodystrophy: Secondary | ICD-10-CM | POA: Diagnosis not present

## 2022-12-04 DIAGNOSIS — D631 Anemia in chronic kidney disease: Secondary | ICD-10-CM | POA: Diagnosis not present

## 2022-12-04 DIAGNOSIS — Z23 Encounter for immunization: Secondary | ICD-10-CM | POA: Diagnosis not present

## 2022-12-04 DIAGNOSIS — D509 Iron deficiency anemia, unspecified: Secondary | ICD-10-CM | POA: Diagnosis not present

## 2022-12-04 DIAGNOSIS — N186 End stage renal disease: Secondary | ICD-10-CM | POA: Diagnosis not present

## 2022-12-04 DIAGNOSIS — Z992 Dependence on renal dialysis: Secondary | ICD-10-CM | POA: Diagnosis not present

## 2022-12-06 ENCOUNTER — Encounter: Payer: Self-pay | Admitting: Family

## 2022-12-06 ENCOUNTER — Ambulatory Visit: Payer: Medicare Other | Attending: Family | Admitting: Family

## 2022-12-06 VITALS — BP 140/69 | HR 70 | Wt 201.0 lb

## 2022-12-06 DIAGNOSIS — I428 Other cardiomyopathies: Secondary | ICD-10-CM | POA: Diagnosis not present

## 2022-12-06 DIAGNOSIS — E1165 Type 2 diabetes mellitus with hyperglycemia: Secondary | ICD-10-CM | POA: Insufficient documentation

## 2022-12-06 DIAGNOSIS — F32A Depression, unspecified: Secondary | ICD-10-CM | POA: Diagnosis not present

## 2022-12-06 DIAGNOSIS — D631 Anemia in chronic kidney disease: Secondary | ICD-10-CM | POA: Diagnosis not present

## 2022-12-06 DIAGNOSIS — E113393 Type 2 diabetes mellitus with moderate nonproliferative diabetic retinopathy without macular edema, bilateral: Secondary | ICD-10-CM | POA: Diagnosis not present

## 2022-12-06 DIAGNOSIS — I132 Hypertensive heart and chronic kidney disease with heart failure and with stage 5 chronic kidney disease, or end stage renal disease: Secondary | ICD-10-CM | POA: Diagnosis not present

## 2022-12-06 DIAGNOSIS — Z8673 Personal history of transient ischemic attack (TIA), and cerebral infarction without residual deficits: Secondary | ICD-10-CM | POA: Insufficient documentation

## 2022-12-06 DIAGNOSIS — Z794 Long term (current) use of insulin: Secondary | ICD-10-CM | POA: Insufficient documentation

## 2022-12-06 DIAGNOSIS — N186 End stage renal disease: Secondary | ICD-10-CM | POA: Diagnosis not present

## 2022-12-06 DIAGNOSIS — Z87891 Personal history of nicotine dependence: Secondary | ICD-10-CM | POA: Insufficient documentation

## 2022-12-06 DIAGNOSIS — E1122 Type 2 diabetes mellitus with diabetic chronic kidney disease: Secondary | ICD-10-CM | POA: Insufficient documentation

## 2022-12-06 DIAGNOSIS — Z992 Dependence on renal dialysis: Secondary | ICD-10-CM | POA: Insufficient documentation

## 2022-12-06 DIAGNOSIS — E1121 Type 2 diabetes mellitus with diabetic nephropathy: Secondary | ICD-10-CM | POA: Diagnosis not present

## 2022-12-06 DIAGNOSIS — E1142 Type 2 diabetes mellitus with diabetic polyneuropathy: Secondary | ICD-10-CM | POA: Diagnosis not present

## 2022-12-06 DIAGNOSIS — E1129 Type 2 diabetes mellitus with other diabetic kidney complication: Secondary | ICD-10-CM | POA: Diagnosis not present

## 2022-12-06 DIAGNOSIS — E1159 Type 2 diabetes mellitus with other circulatory complications: Secondary | ICD-10-CM | POA: Diagnosis not present

## 2022-12-06 DIAGNOSIS — Z79899 Other long term (current) drug therapy: Secondary | ICD-10-CM | POA: Insufficient documentation

## 2022-12-06 DIAGNOSIS — Z7985 Long-term (current) use of injectable non-insulin antidiabetic drugs: Secondary | ICD-10-CM | POA: Diagnosis not present

## 2022-12-06 DIAGNOSIS — Z7901 Long term (current) use of anticoagulants: Secondary | ICD-10-CM | POA: Insufficient documentation

## 2022-12-06 DIAGNOSIS — Z86718 Personal history of other venous thrombosis and embolism: Secondary | ICD-10-CM | POA: Insufficient documentation

## 2022-12-06 DIAGNOSIS — J449 Chronic obstructive pulmonary disease, unspecified: Secondary | ICD-10-CM | POA: Diagnosis not present

## 2022-12-06 DIAGNOSIS — I10 Essential (primary) hypertension: Secondary | ICD-10-CM

## 2022-12-06 DIAGNOSIS — F419 Anxiety disorder, unspecified: Secondary | ICD-10-CM | POA: Diagnosis not present

## 2022-12-06 DIAGNOSIS — I739 Peripheral vascular disease, unspecified: Secondary | ICD-10-CM

## 2022-12-06 DIAGNOSIS — Z9981 Dependence on supplemental oxygen: Secondary | ICD-10-CM | POA: Diagnosis not present

## 2022-12-06 DIAGNOSIS — E1151 Type 2 diabetes mellitus with diabetic peripheral angiopathy without gangrene: Secondary | ICD-10-CM | POA: Diagnosis not present

## 2022-12-06 DIAGNOSIS — I5022 Chronic systolic (congestive) heart failure: Secondary | ICD-10-CM | POA: Insufficient documentation

## 2022-12-06 DIAGNOSIS — Z9641 Presence of insulin pump (external) (internal): Secondary | ICD-10-CM | POA: Diagnosis not present

## 2022-12-06 DIAGNOSIS — M810 Age-related osteoporosis without current pathological fracture: Secondary | ICD-10-CM | POA: Diagnosis not present

## 2022-12-06 NOTE — Patient Instructions (Signed)
It was good to see you today!   Call us in the future if you need [place] for anything.

## 2022-12-06 NOTE — Progress Notes (Signed)
PCP: Merita Norton, FNP (last seen 12/23) Primary Cardiologist: Debbe Odea, MD (last seen 10/24)  HPI:  Ms Hlavaty is a 67 y/o female with a history of DM, HTN, CKD, stroke, anemia, anxiety, COPD, depression, ESRD (dialysis 05/24), PAD, DVT on OAC, lumbar spinal stenosis, leukocytosis, tobacco use and chronic heart failure. Had had graft placed in her left arm 06/18/22. CVA due to thrombosis of the left middle cerebral artery status post ILR placement in 2017. Chronic anticoagulant due to DVT.   Admitted 08/12/21 stent placement to the left superficial femoral artery, popliteal, percutaneous transluminal angioplasty of left common iliac artery, percutaneous angioplasty of the left peroneal artery and mechanical thrombectomy of the left tibioperoneal trunk. Hospital course was complicated by acute renal failure requiring temporary dialysis. She subsequently returned to her baseline stage IV CKD.   Admitted 01/01/22 due to fatigue and dyspnea, also increasing bilateral lower extremity swelling. On presentation she was requiring 2 L of oxygen to maintain oxygen saturation.  Chest x-ray showed cardiomegaly, mild vascular congestion, bibasilar edema. Needed blood transfusion due to anemia. Initially given IV lasix. Transitioned to oral diuretics. Wound consult done. Discharged after 3 days. Was in the ED 02/26/22 due to abnormal labs.    Echo 09/29/21: EF of 45-50% along with moderate LVH & mild LAE.  Echo 02/24/22: EF 60-65%.   RHC/LHC 03/25/15: Severe left ventricular systolic dysfunction with EF 25-30% Elevated pulmonary Order wedge pressure and upper normal to mildly elevated LVEDP  Normal coronary arteries.  Severe systolic left ventricular dysfunction with EF 25-30%. Mildly elevated LVEDP and moderate pulmonary capillary wedge elevation.  She presents today for a HF follow-up visit with a chief complaint of moderate shortness of breath with minimal exertion. Chronic in nature. Has associated  fatigue, head congestion, dry cough, intermittent dizziness (with hyper/ hypoglycemia) and chronic intermittent difficulty sleeping along with this. Denies chest pain, palpitations, abdominal distention, pedal edema or weight gain. Wearing compression socks daily. Has dialysis done T, TH, Sat and denies having any issues with it other than having to sit "for so long". Wearing oxygen at 2L at bedtime only.    She has been having issues with hyperglycemia in the 500's and is being followed closely by endocrinology.   Has received her flu vaccine for this season  ROS: All systems negative except as listed in HPI, PMH and Problem List.  SH:  Social History   Socioeconomic History   Marital status: Married    Spouse name: [name]   Number of children: 2   Years of education: Not on file   Highest education level: Bachelor's degree (e.g., BA, AB, BS)  Occupational History   Occupation: disabled    Comment: retired  Tobacco Use   Smoking status: Former    Current packs/day: 0.00    Average packs/day: 0.5 packs/day for 48.3 years (24.2 ttl pk-yrs)    Types: Cigarettes    Start date: 02/08/1974    Quit date: 05/31/2022    Years since quitting: 0.5   Smokeless tobacco: Never  Vaping Use   Vaping status: Never Used  Substance and Sexual Activity   Alcohol use: No    Alcohol/week: 0.0 standard drinks of alcohol   Drug use: No   Sexual activity: Yes    Birth control/protection: None  Other Topics Concern   Not on file  Social History Narrative   Lives at home with stepson and husband, dogs and cats   Caffeine  Drinks sweet tea.   Right handed.  Social Determinants of Health   Financial Resource Strain: Low Risk  (12/11/2021)   Overall Financial Resource Strain (CARDIA)    Difficulty of Paying Living Expenses: Not very hard  Food Insecurity: No Food Insecurity (01/05/2022)   Hunger Vital Sign    Worried About Running Out of Food in the Last Year: Never true    Ran Out of Food in  the Last Year: Never true  Transportation Needs: No Transportation Needs (01/05/2022)   PRAPARE - Administrator, Civil Service (Medical): No    Lack of Transportation (Non-Medical): No  Physical Activity: Inactive (06/01/2022)   Exercise Vital Sign    Days of Exercise per Week: 0 days    Minutes of Exercise per Session: 0 min  Stress: No Stress Concern Present (06/01/2022)   [employer] of Occupational Health - Occupational Stress Questionnaire    Feeling of Stress : Only a little  Social Connections: Moderately Isolated (12/11/2021)   Social Connection and Isolation Panel [NHANES]    Frequency of Communication with Friends and Family: More than three times a week    Frequency of Social Gatherings with Friends and Family: Once a week    Attends Religious Services: Never    Database administrator or Organizations: No    Attends Banker Meetings: Never    Marital Status: Married  Catering manager Violence: Not At Risk (01/02/2022)   Humiliation, Afraid, Rape, and Kick questionnaire    Fear of Current or Ex-Partner: No    Emotionally Abused: No    Physically Abused: No    Sexually Abused: No    FH:  Family History  Problem Relation Age of Onset   Heart disease Mother        died from CHF   Asthma Mother    Diabetes Mother    Heart disease Father    Aneurysm Father    COPD Brother    Diabetes Brother    Alcohol abuse Paternal Aunt    Cancer Maternal Grandmother        unknownorigin   Anemia Neg Hx    Arrhythmia Neg Hx    Clotting disorder Neg Hx    Fainting Neg Hx    Heart attack Neg Hx    Heart failure Neg Hx    Hyperlipidemia Neg Hx    Hypertension Neg Hx     Past Medical History:  Diagnosis Date   Acute ischemic left MCA stroke (HCC) 01/29/2015   a.) MRI brain 01/29/2015: acute left MCA territory infarcts involving the posterior left frontal lobe and left insular cortex   Anemia of chronic renal failure    Anxiety    Arthritis     Complication of anesthesia    a.) PONV; b.) awareness under anesthesia   COPD (chronic obstructive pulmonary disease) (HCC)    Depression    Dyspnea    ESRD (end stage renal disease) (HCC)    Headache    HFrEF (heart failure with reduced ejection fraction) (HCC)    a.) TTE 01/30/2015: EF 30-35%, diff HK, LAE, G2DD; b.) TTE 06/13/2015: EF 40-45%, diff HK, G1DD; c.) TTE 10/06/2017: EF 60-65% MAC, no IAS; d.) TTE 09/29/2021: EF 45-50%;, LVH, LAE, AoV sclerosis, G1DD; e.) TTE 02/24/2022: EF 60-65%, no IAS   HTN (hypertension)    Leukocytosis    Long term current use of anticoagulant    a.) apixaban   Long term current use of antithrombotics/antiplatelets    a.) clopidogrel   NICM (  nonischemic cardiomyopathy) (HCC)    a.) TTE 01/30/2015: EF 30-35%; b.) R/LHC 03/25/2015: EF 25-30%, mRA 13, mPA 30, mPCWP 26, LVEDP 17, CO 3.66, CI 1.8; c.) TTE 06/13/2015: EF 40-45%; d.) TTE 10/06/2017: EF 60-65%; e.) TTE 09/29/2021: EF 45-50%; f.) TTE 02/24/2022: EF 60-65%   Obesity    On supplemental oxygen by nasal cannula    a.) 2L/Hodgkins at night   Osteoporosis    Peripheral vascular disease (HCC)    Type 2 diabetes mellitus treated with insulin (HCC)     Current Outpatient Medications  Medication Sig Dispense Refill   albuterol (VENTOLIN HFA) 108 (90 Base) MCG/ACT inhaler TAKE 2 PUFFS BY MOUTH EVERY 6 HOURS AS NEEDED FOR WHEEZE OR SHORTNESS OF BREATH (Patient taking differently: Inhale 2 puffs into the lungs every 6 (six) hours as needed for wheezing or shortness of breath.) 8.5 each 2   apixaban (ELIQUIS) 5 MG TABS tablet Take 1 tablet (5 mg total) by mouth 2 (two) times daily. 180 tablet 2   atorvastatin (LIPITOR) 40 MG tablet Take 1 tablet (40 mg total) by mouth daily. 90 tablet 3   buPROPion (WELLBUTRIN SR) 150 MG 12 hr tablet TAKE 1 TABLET BY MOUTH TWICE A DAY 180 tablet 0   calcitRIOL (ROCALTROL) 0.25 MCG capsule Take 0.25 mcg by mouth daily.     Dulaglutide (TRULICITY) 4.5 MG/0.5ML SOPN Inject 4.5 mg  into the skin every 7 (seven) days. (Patient taking differently: Inject 4.5 mg into the skin every 7 (seven) days. Mondays) 6 mL 3   fluticasone (FLONASE) 50 MCG/ACT nasal spray Place 2 sprays into both nostrils daily. (Patient taking differently: Place 2 sprays into both nostrils as needed.) 16 g 6   gabapentin (NEURONTIN) 300 MG capsule Take 300 mg by mouth daily.     glucose blood test strip      hydroxyurea (HYDREA) 500 MG capsule Take 1 capsule (500 mg total) by mouth every other day. May take with food to minimize GI side effects. 15 capsule 3   insulin aspart (NOVOLOG) 100 UNIT/ML injection Insulin pump     Insulin Disposable Pump (OMNIPOD DASH PODS, GEN 4,) MISC USE 1 POD EACH EVERY 72    HOURS     Insulin Human (INSULIN PUMP) SOLN Per endocrinoloy     loratadine (CLARITIN) 10 MG tablet Take 1 tablet by mouth daily.     meclizine (ANTIVERT) 12.5 MG tablet Take 1 tablet (12.5 mg total) by mouth 3 (three) times daily as needed for dizziness. 30 tablet 0   OXYGEN Place 2 L into the nose continuous.     PARoxetine (PAXIL) 40 MG tablet Take 1 tablet (40 mg total) by mouth daily. 90 tablet 1   Spacer/Aero-Holding Chambers (OPTICHAMBER DIAMOND) MISC Use with inhaler to ensure medication is delivered throughout lungs 1 each 0   Tiotropium Bromide-Olodaterol (STIOLTO RESPIMAT) 2.5-2.5 MCG/ACT AERS Inhale 2 puffs into the lungs daily. 8 g 0   torsemide (DEMADEX) 20 MG tablet TAKE 3 TABLETS BY MOUTH EVERY DAY 270 tablet 1   traMADol (ULTRAM) 50 MG tablet Take 1 tablet (50 mg total) by mouth every 12 (twelve) hours as needed for severe pain. 60 tablet 2   Vitamin D, Ergocalciferol, (DRISDOL) 1.25 MG (50000 UNIT) CAPS capsule Take 50,000 Units by mouth every 7 (seven) days. Sunday     No current facility-administered medications for this visit.   Vitals:   12/06/22 1310  BP: (!) 140/69  Pulse: 70  SpO2: 92%  Weight: 201  lb (91.2 kg)   Wt Readings from Last 3 Encounters:  12/06/22 201 lb (91.2  kg)  11/29/22 202 lb (91.6 kg)  11/17/22 202 lb (91.6 kg)   Lab Results  Component Value Date   CREATININE 3.11 (H) 07/27/2022   CREATININE 3.60 (H) 06/18/2022   CREATININE 3.27 (H) 02/26/2022   PHYSICAL EXAM:  General:  Well appearing. No resp difficulty HEENT: normal Neck: supple. JVP flat. No lymphadenopathy or thryomegaly appreciated. Cor: PMI normal. Regular rate & rhythm. No rubs, gallops or murmurs. Lungs: expiratory wheezing in RLL Abdomen: soft, nontender, nondistended. No hepatosplenomegaly. No bruits or masses. Good bowel sounds. Extremities: no cyanosis, clubbing, rash, 1+ pitting bilateral lower legs Neuro: alert & oriented x3, cranial nerves grossly intact. Moves all 4 extremities w/o difficulty. Affect pleasant.   ECG: 06/07/22 was NSR   ASSESSMENT & PLAN:  1: NICM with preserved ejection fraction- - suspect due to CKD as cath in 2017 showed normal coronary arteries - NYHA class III - euvolemic - weighing daily; reminded to call for an overnight weight gain of > 2 pounds or a weekly weight gain of > 5 pounds - weight down 12 pounds from last visit here 3 months ago - Echo 09/29/21: EF of 45-50% along with moderate LVH & mild LAE - Echo 02/24/22: EF 60-65%. .  - RHC/LHC 03/25/15: Severe left ventricular systolic dysfunction with EF 25-30%. Elevated pulmonary Order wedge pressure and upper normal to mildly elevated LVEDP. Normal coronary arteries. Severe systolic left ventricular dysfunction with EF 25-30%. Mildly elevated LVEDP and moderate pulmonary capillary wedge elevation. - not adding salt to her food - continue torsemide 40mg  daily  - due to renal function, not a candidate for spironolactone  - emphasized using her walker whenever she is walking - saw cardiology Cathe Mons) 10/24 - saw [facility] provider (Bensimhon) 12/23 - has compression socks on both lower legs - BNP 02/11/22 was 735.3  2: HTN with CKD- - BP 140/69 - saw PCP Suzie Portela) 12/23 - BMP 08/16/22  reviewed and showed sodium 144, potassium 4.9, creatinine 3.13 & GFR 16 - saw nephrology Thedore Mins) 07/24 - now doing dialysis T, TH, Sat and says that lab work gets done frequently at dialysis center  3: Stage 2 COPD- - saw pulmonology Jayme Cloud) 10/24 - wears oxygen at 2L at bedtime  4: Anemia- - saw hematology Smith Robert) 08/24 - hemoglobin 11/03/22 was 10.5 - received iron infusion 08/19/22 - says that there's been a discussion about doing a bone marrow biopsy  5: DM- - saw endocrinology (Solum) 07/24 - A1c 04/22/22 was 7.8; previously was 10.1%  6: PAD- - saw vascular Manson Passey) 06/24 - had graft in left arm placed 06/18/22 - dialysis T, TH, Sat since 05/24   Due to HF stability and patient now doing dialysis, will have patient follow-up in the HF clinic on PRN basis. She is to follow closely with cardiology but call us back for any issues or questions. She was comfortable with this plan.

## 2022-12-07 ENCOUNTER — Other Ambulatory Visit: Payer: Self-pay | Admitting: *Deleted

## 2022-12-07 DIAGNOSIS — Z992 Dependence on renal dialysis: Secondary | ICD-10-CM | POA: Diagnosis not present

## 2022-12-07 DIAGNOSIS — Z23 Encounter for immunization: Secondary | ICD-10-CM | POA: Diagnosis not present

## 2022-12-07 DIAGNOSIS — D631 Anemia in chronic kidney disease: Secondary | ICD-10-CM | POA: Diagnosis not present

## 2022-12-07 DIAGNOSIS — D509 Iron deficiency anemia, unspecified: Secondary | ICD-10-CM | POA: Diagnosis not present

## 2022-12-07 DIAGNOSIS — N186 End stage renal disease: Secondary | ICD-10-CM | POA: Diagnosis not present

## 2022-12-07 DIAGNOSIS — N250 Renal osteodystrophy: Secondary | ICD-10-CM | POA: Diagnosis not present

## 2022-12-08 ENCOUNTER — Inpatient Hospital Stay (HOSPITAL_BASED_OUTPATIENT_CLINIC_OR_DEPARTMENT_OTHER): Payer: Medicare Other | Admitting: Oncology

## 2022-12-08 ENCOUNTER — Inpatient Hospital Stay: Payer: Medicare Other | Attending: Oncology

## 2022-12-08 ENCOUNTER — Encounter: Payer: Self-pay | Admitting: Oncology

## 2022-12-08 ENCOUNTER — Other Ambulatory Visit: Payer: Self-pay | Admitting: Family Medicine

## 2022-12-08 VITALS — BP 127/64 | HR 76 | Temp 97.7°F | Resp 17 | Ht 66.0 in | Wt 196.2 lb

## 2022-12-08 DIAGNOSIS — D500 Iron deficiency anemia secondary to blood loss (chronic): Secondary | ICD-10-CM | POA: Insufficient documentation

## 2022-12-08 DIAGNOSIS — N186 End stage renal disease: Secondary | ICD-10-CM | POA: Insufficient documentation

## 2022-12-08 DIAGNOSIS — D473 Essential (hemorrhagic) thrombocythemia: Secondary | ICD-10-CM

## 2022-12-08 DIAGNOSIS — Z79899 Other long term (current) drug therapy: Secondary | ICD-10-CM | POA: Diagnosis not present

## 2022-12-08 DIAGNOSIS — Z8673 Personal history of transient ischemic attack (TIA), and cerebral infarction without residual deficits: Secondary | ICD-10-CM | POA: Diagnosis not present

## 2022-12-08 DIAGNOSIS — F339 Major depressive disorder, recurrent, unspecified: Secondary | ICD-10-CM

## 2022-12-08 DIAGNOSIS — N184 Chronic kidney disease, stage 4 (severe): Secondary | ICD-10-CM | POA: Diagnosis not present

## 2022-12-08 DIAGNOSIS — D631 Anemia in chronic kidney disease: Secondary | ICD-10-CM | POA: Insufficient documentation

## 2022-12-08 DIAGNOSIS — Z7901 Long term (current) use of anticoagulants: Secondary | ICD-10-CM | POA: Insufficient documentation

## 2022-12-08 DIAGNOSIS — Z87891 Personal history of nicotine dependence: Secondary | ICD-10-CM | POA: Diagnosis not present

## 2022-12-08 DIAGNOSIS — Z992 Dependence on renal dialysis: Secondary | ICD-10-CM | POA: Insufficient documentation

## 2022-12-08 LAB — CBC WITH DIFFERENTIAL (CANCER CENTER ONLY)
Abs Immature Granulocytes: 0.1 10*3/uL — ABNORMAL HIGH (ref 0.00–0.07)
Basophils Absolute: 0.1 10*3/uL (ref 0.0–0.1)
Basophils Relative: 1 %
Eosinophils Absolute: 0.1 10*3/uL (ref 0.0–0.5)
Eosinophils Relative: 1 %
HCT: 37.3 % (ref 36.0–46.0)
Hemoglobin: 11.1 g/dL — ABNORMAL LOW (ref 12.0–15.0)
Immature Granulocytes: 1 %
Lymphocytes Relative: 6 %
Lymphs Abs: 0.8 10*3/uL (ref 0.7–4.0)
MCH: 26.7 pg (ref 26.0–34.0)
MCHC: 29.8 g/dL — ABNORMAL LOW (ref 30.0–36.0)
MCV: 89.9 fL (ref 80.0–100.0)
Monocytes Absolute: 0.9 10*3/uL (ref 0.1–1.0)
Monocytes Relative: 6 %
Neutro Abs: 12.8 10*3/uL — ABNORMAL HIGH (ref 1.7–7.7)
Neutrophils Relative %: 85 %
Platelet Count: 328 10*3/uL (ref 150–400)
RBC: 4.15 MIL/uL (ref 3.87–5.11)
RDW: 19 % — ABNORMAL HIGH (ref 11.5–15.5)
WBC Count: 14.8 10*3/uL — ABNORMAL HIGH (ref 4.0–10.5)
nRBC: 0 % (ref 0.0–0.2)

## 2022-12-08 LAB — IRON AND TIBC
Iron: 47 ug/dL (ref 28–170)
Saturation Ratios: 15 % (ref 10.4–31.8)
TIBC: 321 ug/dL (ref 250–450)
UIBC: 274 ug/dL

## 2022-12-08 LAB — FERRITIN: Ferritin: 80 ng/mL (ref 11–307)

## 2022-12-08 NOTE — Telephone Encounter (Signed)
Requested Prescriptions  Pending Prescriptions Disp Refills   buPROPion (WELLBUTRIN SR) 150 MG 12 hr tablet [Pharmacy Med Name: BUPROPION HCL SR 150 MG TABLET] 180 tablet 0    Sig: TAKE 1 TABLET BY MOUTH TWICE A DAY     Psychiatry: Antidepressants - bupropion Failed - 12/08/2022  2:26 AM      Failed - Cr in normal range and within 360 days    Creat  Date Value Ref Range Status  03/21/2015 1.37 (H) 0.50 - 0.99 mg/dL Final   Creatinine, Ser  Date Value Ref Range Status  07/27/2022 3.11 (H) 0.44 - 1.00 mg/dL Final   Creatinine, Urine  Date Value Ref Range Status  04/22/2022 3.7  Final         Failed - AST in normal range and within 360 days    AST  Date Value Ref Range Status  12/15/2021 13 (L) 15 - 41 U/L Final         Failed - Last BP in normal range    BP Readings from Last 1 Encounters:  12/08/22 127/64         Failed - Valid encounter within last 6 months    Recent Outpatient Visits           6 months ago Encounter for subsequent annual wellness visit (AWV) in Medicare patient   [facility] Jacky Kindle, FNP   10 months ago Depression, recurrent [facility])   [facility] Merita Norton T, FNP   11 months ago Pain of right lower extremity   [facility] Caro Laroche, DO   1 year ago Screening for colon cancer   [facility] Jacky Kindle, FNP   1 year ago Foot pain, right   [facility] [place], Darl Householder, DO       Future Appointments             In 3 months Hammock, Lavonna Rua, NP [facility] - ALT in normal range and within 360 days    ALT  Date Value Ref Range Status  12/15/2021 10 0 - 44 U/L Final         Passed - Completed PHQ-2 or PHQ-9 in the last 360 days

## 2022-12-09 DIAGNOSIS — D631 Anemia in chronic kidney disease: Secondary | ICD-10-CM | POA: Diagnosis not present

## 2022-12-09 DIAGNOSIS — D509 Iron deficiency anemia, unspecified: Secondary | ICD-10-CM | POA: Diagnosis not present

## 2022-12-09 DIAGNOSIS — Z992 Dependence on renal dialysis: Secondary | ICD-10-CM | POA: Diagnosis not present

## 2022-12-09 DIAGNOSIS — N250 Renal osteodystrophy: Secondary | ICD-10-CM | POA: Diagnosis not present

## 2022-12-09 DIAGNOSIS — N186 End stage renal disease: Secondary | ICD-10-CM | POA: Diagnosis not present

## 2022-12-09 DIAGNOSIS — Z23 Encounter for immunization: Secondary | ICD-10-CM | POA: Diagnosis not present

## 2022-12-10 ENCOUNTER — Encounter: Payer: Self-pay | Admitting: Oncology

## 2022-12-10 NOTE — Progress Notes (Signed)
Hematology/Oncology Consult note [facility]  [phone] [phone]  Patient Care Team: Jacky Kindle, FNP as PCP - General (Family Medicine) Tedd Sias, Marlana Salvage, MD as Physician Assistant (Endocrinology) Duke Salvia, MD as Consulting Physician (Cardiology) Brandy Hale, MD (Inactive) as Referring Physician (Psychiatry) Deirdre Evener, MD as Consulting Physician (Dermatology) Dimmig, Maisie Fus, MD as Referring Physician (Orthopedic Surgery) Schnier, Latina Craver, MD (Vascular Surgery) Albin Felling, OD (Optometry) Salena Saner, MD as Consulting Physician (Pulmonary Disease) Juanell Fairly, RN as Case Manager Creig Hines, MD as Consulting Physician (Oncology)   Name of the patient: Jocelyn Wells  [id number]  06-Feb-1956   Date of visit: 12/10/22  Diagnosis- 1.  Iron deficiency anemia 2.  JAK2 positive thrombocytosis likely essential thrombocytosis    Chief complaint/ Reason for visit-routine follow-up of essential thrombocytosis and iron deficiency anemia  Heme/Onc history: patient is a 67 year old female with a past medical history significant for smoking, CKD end-stage on dialysis, hypertension hyperlipidemia, COPD, history of stroke on Eliquis among other medical problems.She has been referred for leukocytosis.  Most recent CBC from 12/07/2021 showed white cell count of 13.9, H&H of 7.2/24.6 with an MCV of 80.9 and a platelet count of 535.  Differential is mainly shown neutrophilia with occasional monocytosis her white cell count at baseline up until July 2023 was around 11 and it dropped down to 8.2 in August 2023.  She has had some borderline microcytosis over the years.  No recent iron studies have been checked.  She has not seen GI recently.  Is any vaginal bleeding.  Denies any blood loss in her stool or urine.  Denies any dark melanotic stools.  Denies any consistent use of NSAIDs    Patient had an EGD and colonoscopy in  January 2024 by Dr. Allegra Lai we did not show any evidence of bleeding.  Endoscopy revealed mild chronic gastritis that was negative for H. pylori.She also had a capsule study which did not show any active bleeding  Interval history-patient is presently on dialysis.  She has chronic fatigue.  Denies any new complaints at this time  ECOG PS- 2 Pain scale- 3   Review of systems- Review of Systems  Constitutional:  Positive for malaise/fatigue. Negative for chills, fever and weight loss.  HENT:  Negative for congestion, ear discharge and nosebleeds.   Eyes:  Negative for blurred vision.  Respiratory:  Negative for cough, hemoptysis, sputum production, shortness of breath and wheezing.   Cardiovascular:  Negative for chest pain, palpitations, orthopnea and claudication.  Gastrointestinal:  Negative for abdominal pain, blood in stool, constipation, diarrhea, heartburn, melena, nausea and vomiting.  Genitourinary:  Negative for dysuria, flank pain, frequency, hematuria and urgency.  Musculoskeletal:  Negative for back pain, joint pain and myalgias.  Skin:  Negative for rash.  Neurological:  Negative for dizziness, tingling, focal weakness, seizures, weakness and headaches.  Endo/Heme/Allergies:  Does not bruise/bleed easily.  Psychiatric/Behavioral:  Negative for depression and suicidal ideas. The patient does not have insomnia.       Allergies  Allergen Reactions   Codeine Nausea And Vomiting   Ivp Dye [Iodinated Contrast Media]     Patients states the IV Dye shuts her kidneys down     Past Medical History:  Diagnosis Date   Acute ischemic left MCA stroke (HCC) 01/29/2015   a.) MRI brain 01/29/2015: acute left MCA territory infarcts involving the posterior left frontal lobe and left insular cortex   Anemia of  chronic renal failure    Anxiety    Arthritis    Complication of anesthesia    a.) PONV; b.) awareness under anesthesia   COPD (chronic obstructive pulmonary disease) (HCC)     Depression    Dyspnea    ESRD (end stage renal disease) (HCC)    Headache    HFrEF (heart failure with reduced ejection fraction) (HCC)    a.) TTE 01/30/2015: EF 30-35%, diff HK, LAE, G2DD; b.) TTE 06/13/2015: EF 40-45%, diff HK, G1DD; c.) TTE 10/06/2017: EF 60-65% MAC, no IAS; d.) TTE 09/29/2021: EF 45-50%;, LVH, LAE, AoV sclerosis, G1DD; e.) TTE 02/24/2022: EF 60-65%, no IAS   HTN (hypertension)    Leukocytosis    Long term current use of anticoagulant    a.) apixaban   Long term current use of antithrombotics/antiplatelets    a.) clopidogrel   NICM (nonischemic cardiomyopathy) (HCC)    a.) TTE 01/30/2015: EF 30-35%; b.) R/LHC 03/25/2015: EF 25-30%, mRA 13, mPA 30, mPCWP 26, LVEDP 17, CO 3.66, CI 1.8; c.) TTE 06/13/2015: EF 40-45%; d.) TTE 10/06/2017: EF 60-65%; e.) TTE 09/29/2021: EF 45-50%; f.) TTE 02/24/2022: EF 60-65%   Obesity    On supplemental oxygen by nasal cannula    a.) 2L/ at night   Osteoporosis    Peripheral vascular disease (HCC)    Type 2 diabetes mellitus treated with insulin [facility])      Past Surgical History:  Procedure Laterality Date   ABDOMINAL HYSTERECTOMY     ANKLE FRACTURE SURGERY Left 02/09/2000   AV FISTULA PLACEMENT Left 06/18/2022   Procedure: ARTERIOVENOUS (AV) FISTULA CREATION (BRACHIAL AXILLA);  Surgeon: Renford Dills, MD;  Location: [facility] ORS;  Service: Vascular;  Laterality: Left;   BILATERAL SALPINGOOPHORECTOMY  02/08/1998   CARDIAC CATHETERIZATION N/A 03/25/2015   Procedure: Right/Left Heart Cath and Coronary Angiography;  Surgeon: Lyn Records, MD;  Location: [facility] INVASIVE CV LAB;  Service: Cardiovascular;  Laterality: N/A;   CESAREAN SECTION  02/09/1976   COLONOSCOPY WITH PROPOFOL N/A 09/22/2016   Procedure: COLONOSCOPY WITH PROPOFOL;  Surgeon: Scot Jun, MD;  Location: [facility] ENDOSCOPY;  Service: Endoscopy;  Laterality: N/A;   COLONOSCOPY WITH PROPOFOL N/A 02/15/2022   Procedure: COLONOSCOPY WITH PROPOFOL;  Surgeon: Toney Reil, MD;  Location: [facility] ENDOSCOPY;  Service: Gastroenterology;  Laterality: N/A;   EP IMPLANTABLE DEVICE N/A 08/05/2015   Procedure: Loop Recorder Insertion;  Surgeon: Duke Salvia, MD;  Location: [facility] INVASIVE CV LAB;  Service: Cardiovascular;  Laterality: N/A;   ESOPHAGOGASTRODUODENOSCOPY N/A 02/15/2022   Procedure: ESOPHAGOGASTRODUODENOSCOPY (EGD);  Surgeon: Toney Reil, MD;  Location: [facility] ENDOSCOPY;  Service: Gastroenterology;  Laterality: N/A;   ESOPHAGOGASTRODUODENOSCOPY (EGD) WITH PROPOFOL N/A 09/22/2016   Procedure: ESOPHAGOGASTRODUODENOSCOPY (EGD) WITH PROPOFOL;  Surgeon: Scot Jun, MD;  Location: [facility] ENDOSCOPY;  Service: Endoscopy;  Laterality: N/A;   ESOPHAGOGASTRODUODENOSCOPY (EGD) WITH PROPOFOL N/A 12/08/2016   Procedure: ESOPHAGOGASTRODUODENOSCOPY (EGD) WITH PROPOFOL;  Surgeon: Scot Jun, MD;  Location: [facility] ENDOSCOPY;  Service: Endoscopy;  Laterality: N/A;   ESOPHAGOGASTRODUODENOSCOPY (EGD) WITH PROPOFOL N/A 10/12/2022   Procedure: ESOPHAGOGASTRODUODENOSCOPY (EGD) WITH PROPOFOL;  Surgeon: Toney Reil, MD;  Location: [facility] ENDOSCOPY;  Service: Gastroenterology;  Laterality: N/A;   FRACTURE SURGERY     GIVENS CAPSULE STUDY N/A 04/19/2022   Procedure: GIVENS CAPSULE STUDY;  Surgeon: Toney Reil, MD;  Location: [facility] ENDOSCOPY;  Service: Gastroenterology;  Laterality: N/A;   GIVENS CAPSULE STUDY N/A 10/12/2022   Procedure: GIVENS CAPSULE STUDY;  Surgeon: Allegra Lai,  Loel Dubonnet, MD;  Location: [facility] ENDOSCOPY;  Service: Gastroenterology;  Laterality: N/A;   INSERTION HYBRID ANTERIOVENOUS GORTEX GRAFT  06/18/2022   Procedure: INSERTION HYBRID ANTERIOVENOUS GORTEX GRAFT;  Surgeon: Renford Dills, MD;  Location: [facility] ORS;  Service: Vascular;;   KNEE ARTHROSCOPY Left 02/09/2003   LOWER EXTREMITY ANGIOGRAPHY Left 08/12/2021   Procedure: Lower Extremity Angiography;  Surgeon: Renford Dills, MD;  Location: [facility] INVASIVE CV LAB;  Service:  Cardiovascular;  Laterality: Left;   TEE WITHOUT CARDIOVERSION N/A 01/31/2015   Procedure: TRANSESOPHAGEAL ECHOCARDIOGRAM (TEE);  Surgeon: Lewayne Bunting, MD;  Location: [facility] ENDOSCOPY;  Service: Cardiovascular;  Laterality: N/A;   TEMPORARY DIALYSIS CATHETER N/A 08/14/2021   Procedure: TEMPORARY DIALYSIS CATHETER;  Surgeon: Renford Dills, MD;  Location: [facility] INVASIVE CV LAB;  Service: Cardiovascular;  Laterality: N/A;   TUBAL LIGATION  02/09/1976   ULNAR NERVE TRANSPOSITION  02/08/2006    Social History   Socioeconomic History   Marital status: Married    Spouse name: [name]   Number of children: 2   Years of education: Not on file   Highest education level: Bachelor's degree (e.g., BA, AB, BS)  Occupational History   Occupation: disabled    Comment: retired  Tobacco Use   Smoking status: Former    Current packs/day: 0.00    Average packs/day: 0.5 packs/day for 48.3 years (24.2 ttl pk-yrs)    Types: Cigarettes    Start date: 02/08/1974    Quit date: 05/31/2022    Years since quitting: 0.5   Smokeless tobacco: Never  Vaping Use   Vaping status: Never Used  Substance and Sexual Activity   Alcohol use: No    Alcohol/week: 0.0 standard drinks of alcohol   Drug use: No   Sexual activity: Yes    Birth control/protection: None  Other Topics Concern   Not on file  Social History Narrative   Lives at home with stepson and husband, dogs and cats   Caffeine  Drinks sweet tea.   Right handed.    Social Determinants of Health   Financial Resource Strain: Low Risk  (12/11/2021)   Overall Financial Resource Strain (CARDIA)    Difficulty of Paying Living Expenses: Not very hard  Food Insecurity: No Food Insecurity (01/05/2022)   Hunger Vital Sign    Worried About Running Out of Food in the Last Year: Never true    Ran Out of Food in the Last Year: Never true  Transportation Needs: No Transportation Needs (01/05/2022)   PRAPARE - Administrator, Civil Service  (Medical): No    Lack of Transportation (Non-Medical): No  Physical Activity: Inactive (06/01/2022)   Exercise Vital Sign    Days of Exercise per Week: 0 days    Minutes of Exercise per Session: 0 min  Stress: No Stress Concern Present (06/01/2022)   [employer] of Occupational Health - Occupational Stress Questionnaire    Feeling of Stress : Only a little  Social Connections: Moderately Isolated (12/11/2021)   Social Connection and Isolation Panel [NHANES]    Frequency of Communication with Friends and Family: More than three times a week    Frequency of Social Gatherings with Friends and Family: Once a week    Attends Religious Services: Never    Database administrator or Organizations: No    Attends Banker Meetings: Never    Marital Status: Married  Catering manager Violence: Not At Risk (01/02/2022)   Humiliation, Afraid, Rape, and Kick questionnaire  Fear of Current or Ex-Partner: No    Emotionally Abused: No    Physically Abused: No    Sexually Abused: No    Family History  Problem Relation Age of Onset   Heart disease Mother        died from CHF   Asthma Mother    Diabetes Mother    Heart disease Father    Aneurysm Father    COPD Brother    Diabetes Brother    Alcohol abuse Paternal Aunt    Cancer Maternal Grandmother        unknownorigin   Anemia Neg Hx    Arrhythmia Neg Hx    Clotting disorder Neg Hx    Fainting Neg Hx    Heart attack Neg Hx    Heart failure Neg Hx    Hyperlipidemia Neg Hx    Hypertension Neg Hx      Current Outpatient Medications:    albuterol (VENTOLIN HFA) 108 (90 Base) MCG/ACT inhaler, TAKE 2 PUFFS BY MOUTH EVERY 6 HOURS AS NEEDED FOR WHEEZE OR SHORTNESS OF BREATH (Patient taking differently: Inhale 2 puffs into the lungs every 6 (six) hours as needed for wheezing or shortness of breath.), Disp: 8.5 each, Rfl: 2   apixaban (ELIQUIS) 5 MG TABS tablet, Take 1 tablet (5 mg total) by mouth 2 (two) times daily., Disp: 180  tablet, Rfl: 2   atorvastatin (LIPITOR) 40 MG tablet, Take 1 tablet (40 mg total) by mouth daily., Disp: 90 tablet, Rfl: 3   buPROPion (WELLBUTRIN SR) 150 MG 12 hr tablet, TAKE 1 TABLET BY MOUTH TWICE A DAY, Disp: 180 tablet, Rfl: 0   calcitRIOL (ROCALTROL) 0.25 MCG capsule, Take 0.25 mcg by mouth daily., Disp: , Rfl:    Dulaglutide (TRULICITY) 4.5 MG/0.5ML SOPN, Inject 4.5 mg into the skin every 7 (seven) days. (Patient taking differently: Inject 4.5 mg into the skin every 7 (seven) days. Mondays), Disp: 6 mL, Rfl: 3   fluticasone (FLONASE) 50 MCG/ACT nasal spray, Place 2 sprays into both nostrils daily. (Patient taking differently: Place 2 sprays into both nostrils as needed.), Disp: 16 g, Rfl: 6   gabapentin (NEURONTIN) 300 MG capsule, Take 300 mg by mouth daily., Disp: , Rfl:    glucose blood test strip, , Disp: , Rfl:    hydroxyurea (HYDREA) 500 MG capsule, Take 1 capsule (500 mg total) by mouth every other day. May take with food to minimize GI side effects., Disp: 15 capsule, Rfl: 3   insulin aspart (NOVOLOG) 100 UNIT/ML injection, Insulin pump, Disp: , Rfl:    Insulin Disposable Pump (OMNIPOD DASH PODS, GEN 4,) MISC, USE 1 POD EACH EVERY 72    HOURS, Disp: , Rfl:    Insulin Human (INSULIN PUMP) SOLN, Per endocrinoloy, Disp: , Rfl:    loratadine (CLARITIN) 10 MG tablet, Take 1 tablet by mouth daily., Disp: , Rfl:    meclizine (ANTIVERT) 12.5 MG tablet, Take 1 tablet (12.5 mg total) by mouth 3 (three) times daily as needed for dizziness., Disp: 30 tablet, Rfl: 0   OXYGEN, Place 2 L into the nose continuous., Disp: , Rfl:    PARoxetine (PAXIL) 40 MG tablet, Take 1 tablet (40 mg total) by mouth daily., Disp: 90 tablet, Rfl: 1   Spacer/Aero-Holding Chambers (OPTICHAMBER DIAMOND) MISC, Use with inhaler to ensure medication is delivered throughout lungs, Disp: 1 each, Rfl: 0   Tiotropium Bromide-Olodaterol (STIOLTO RESPIMAT) 2.5-2.5 MCG/ACT AERS, Inhale 2 puffs into the lungs daily., Disp: 8 g,  Rfl: 0   torsemide (DEMADEX) 20 MG tablet, TAKE 3 TABLETS BY MOUTH EVERY DAY (Patient taking differently: TAKE 2 TABLETS BY MOUTH EVERY DAY), Disp: 270 tablet, Rfl: 1   traMADol (ULTRAM) 50 MG tablet, Take 1 tablet (50 mg total) by mouth every 12 (twelve) hours as needed for severe pain., Disp: 60 tablet, Rfl: 2   Vitamin D, Ergocalciferol, (DRISDOL) 1.25 MG (50000 UNIT) CAPS capsule, Take 50,000 Units by mouth every 7 (seven) days. Sunday, Disp: , Rfl:   Physical exam:  Vitals:   12/08/22 1256  BP: 127/64  Pulse: 76  Resp: 17  Temp: 97.7 F (36.5 C)  TempSrc: Tympanic  SpO2: 90%  Weight: 196 lb 3.2 oz (89 kg)  Height: 5\' 6"  (1.676 m)   Physical Exam Constitutional:      Comments: Sitting in a wheelchair.  Appears fatigued  Cardiovascular:     Rate and Rhythm: Normal rate and regular rhythm.     Heart sounds: Normal heart sounds.  Pulmonary:     Effort: Pulmonary effort is normal.     Breath sounds: Normal breath sounds.  Abdominal:     General: Bowel sounds are normal.     Palpations: Abdomen is soft.  Skin:    General: Skin is warm and dry.  Neurological:     Mental Status: She is alert and oriented to person, place, and time.         Latest Ref Rng & Units 07/27/2022   10:55 AM  CMP  Glucose 70 - 99 mg/dL 109   BUN 8 - 23 mg/dL 47   Creatinine 3.23 - 1.00 mg/dL 5.57   Sodium 322 - 025 mmol/L 140   Potassium 3.5 - 5.1 mmol/L 3.8   Chloride 98 - 111 mmol/L 108   CO2 22 - 32 mmol/L 22   Calcium 8.9 - 10.3 mg/dL 7.1       Latest Ref Rng & Units 12/08/2022   12:52 PM  CBC  WBC 4.0 - 10.5 K/uL 14.8   Hemoglobin 12.0 - 15.0 g/dL 42.7   Hematocrit 06.2 - 46.0 % 37.3   Platelets 150 - 400 K/uL 328      Assessment and plan- Patient is a 67 y.o. female who is here for follow-up of following issues:  Iron deficiency anemia: Patient is currently on dialysis and will be receiving IV iron through dialysis.  Iron studies are within normal limits and hemoglobin stable  around 11.1.  Continue to monitor  JAK2 positive thrombocytosis likely essential thrombocytosis: Patient is currently on 500 mg of Hydrea every other day which she is tolerating well.  Platelets are at goal less than 400.  She is also on Eliquis for her history of stroke and therefore she is not on low-dose aspirin.  CBC ferritin and iron studies and CMP in 3 and 6 months and I will see her back in 6 months   Visit Diagnosis 1. Anemia in stage 4 chronic kidney disease (HCC)   2. Iron deficiency anemia due to chronic blood loss   3. High risk medication use   4. Essential thrombocytosis (HCC)      Dr. Owens Shark, MD, MPH [facility] [phone] 12/10/2022 8:34 AM

## 2022-12-11 DIAGNOSIS — N186 End stage renal disease: Secondary | ICD-10-CM | POA: Diagnosis not present

## 2022-12-11 DIAGNOSIS — D631 Anemia in chronic kidney disease: Secondary | ICD-10-CM | POA: Diagnosis not present

## 2022-12-11 DIAGNOSIS — N250 Renal osteodystrophy: Secondary | ICD-10-CM | POA: Diagnosis not present

## 2022-12-11 DIAGNOSIS — Z992 Dependence on renal dialysis: Secondary | ICD-10-CM | POA: Diagnosis not present

## 2022-12-11 DIAGNOSIS — D509 Iron deficiency anemia, unspecified: Secondary | ICD-10-CM | POA: Diagnosis not present

## 2022-12-14 ENCOUNTER — Ambulatory Visit (INDEPENDENT_AMBULATORY_CARE_PROVIDER_SITE_OTHER): Payer: Medicare Other | Admitting: Nurse Practitioner

## 2022-12-14 ENCOUNTER — Other Ambulatory Visit: Payer: Self-pay

## 2022-12-14 ENCOUNTER — Other Ambulatory Visit: Payer: Medicare Other | Admitting: Pharmacist

## 2022-12-14 NOTE — Progress Notes (Signed)
12/14/2022 Name: Jocelyn Wells MRN: [id number] DOB: [birth year]  Chief Complaint  Patient presents with   Diabetes    Jocelyn Wells is a 67 y.o. year old female who presented for a telephone visit.   They were referred to the pharmacist by  [facility]  for assistance in managing diabetes.    Subjective:  Care Team: Primary Care Provider: Jacky Kindle, FNP ; Next Scheduled Visit: None- due in April 2025 Clinical Pharmacist: Marlowe Aschoff, PharmD  Medication Access/Adherence  [facility] Pharmacy[facility]  [facility] [address], [place] - [address] [place] [address] Phone: [phone] Fax: [phone]  [facility] [place], [place] - [address] [facility] AT Portal to [facility] [address] [place] [address] Phone: [phone] Fax: [phone]   Patient reports affordability concerns with their medications: No  Patient reports access/transportation concerns to their pharmacy: No  Patient reports adherence concerns with their medications:  No    No concerns expressed, however I do have concerns that Novolog has NOT been picked up since 05/09/22 for 90DS and Trulicity since 02/24/22 for 84DS   - Reports husband takes Trulicity as well and that he has been picking it up under his name and both using his supply- would NOT have a enough for 2 people, so this does NOT make sense   Diabetes:  Current medications: Novology with Omnipod Dash Medications tried in the past: Jardiance 25mg  daily (lack of benefit), Lantus (64 U + Novolog 20U with supper- switched for better efficacy after inpatient stay in 2022). Ozempic (nausea), Glyburide/metformin (low eGFR) *Limited options due to dialysis/ESRD  *Has a [employer] 2 (reader), along with OmniPod Dash Gen 3 (uses with Novolog) Gets dialysis on Tues, Thurs, Sat Did not share reader info today as she was feeling too sick- would be willing to provide in  2 weeks; skipping dialysis today as well   Patient denies hypoglycemic s/sx including dizziness, shakiness, sweating. Patient denies hyperglycemic symptoms including polyuria, polydipsia, polyphagia, nocturia, neuropathy, blurred vision.  Current medication access support: BCBS Medicare  **A1c was 11.9% on 12/06/22 at [facility] for Endo - Note reports non-adherence to use of Novolog- settings adjusted during visit  Objective:  Lab Results  Component Value Date   HGBA1C 7.8 04/22/2022    Lab Results  Component Value Date   CREATININE 3.11 (H) 07/27/2022   BUN 47 (H) 07/27/2022   NA 140 07/27/2022   K 3.8 07/27/2022   CL 108 07/27/2022   CO2 22 07/27/2022    Lab Results  Component Value Date   CHOL 107 12/04/2021   HDL 36 (L) 12/04/2021   LDLCALC 50 12/04/2021   TRIG 112 12/04/2021   CHOLHDL 3.0 12/04/2021    Medications Reviewed Today     Reviewed by Pollie Friar, RPH (Pharmacist) on 12/14/22 at 1034  Med List Status: <None>   Medication Order Taking? Sig Documenting Provider Last Dose Status Informant  albuterol (VENTOLIN HFA) 108 (90 Base) MCG/ACT inhaler [id number] Yes TAKE 2 PUFFS BY MOUTH EVERY 6 HOURS AS NEEDED FOR WHEEZE OR SHORTNESS OF BREATH  Patient taking differently: Inhale 2 puffs into the lungs every 6 (six) hours as needed for wheezing or shortness of breath.   Jacky Kindle, FNP Taking Active Spouse/Significant Other  apixaban (ELIQUIS) 5 MG TABS tablet [id number] Yes Take 1 tablet (5 mg total) by mouth 2 (two) times daily. Charlsie Quest, NP Taking Active  atorvastatin (LIPITOR) 40 MG tablet [id number] Yes Take 1 tablet (40 mg total) by mouth daily. Jacky Kindle, FNP Taking Active Spouse/Significant Other  buPROPion [facility] SR) 150 MG 12 hr tablet [id number] Yes TAKE 1 TABLET BY MOUTH TWICE A DAY Jacky Kindle, FNP Taking Active   calcitRIOL (ROCALTROL) 0.25 MCG capsule [id number] Yes Take 0.25 mcg by mouth daily. [provider]  Taking Active   Dulaglutide (TRULICITY) 4.5 MG/0.5ML SOPN [id number] Yes Inject 4.5 mg into the skin every 7 (seven) days.  Patient taking differently: Inject 4.5 mg into the skin every 7 (seven) days. Mondays    Taking Active   fluticasone (FLONASE) 50 MCG/ACT nasal spray [id number] Yes Place 2 sprays into both nostrils daily.  Patient taking differently: Place 2 sprays into both nostrils as needed.   Jacky Kindle, FNP Taking Active Spouse/Significant Other, Self  gabapentin (NEURONTIN) 300 MG capsule [id number] Yes Take 300 mg by mouth daily. [provider] Taking Active Spouse/Significant Other  glucose blood test strip [id number] Yes  [provider] Taking Active Spouse/Significant Other  hydroxyurea (HYDREA) 500 MG capsule [id number] Yes Take 1 capsule (500 mg total) by mouth every other day. May take with food to minimize GI side effects. Creig Hines, MD Taking Active   insulin aspart (NOVOLOG) 100 UNIT/ML injection [id number] Yes Insulin pump [provider] Taking Active Spouse/Significant Other  Insulin Disposable Pump (OMNIPOD DASH PODS, GEN 4,) MISC [id number] Yes USE 1 POD EACH EVERY 72    HOURS [provider] Taking Active Spouse/Significant Other  Insulin Human (INSULIN PUMP) SOLN [id number] Yes Per endocrinoloy Elgergawy, Leana Roe, MD Taking Active Spouse/Significant Other  loratadine (CLARITIN) 10 MG tablet [id number] Yes Take 1 tablet by mouth daily. [provider] Taking Active Spouse/Significant Other           Med Note Doreatha Martin, RACHEL A   Fri Mar 21, 2015 10:46 AM)    meclizine (ANTIVERT) 12.5 MG tablet [id number] Yes Take 1 tablet (12.5 mg total) by mouth 3 (three) times daily as needed for dizziness. Caro Laroche, DO Taking Active Spouse/Significant Other  OXYGEN [id number] Yes Place 2 L into the nose continuous. [provider] Taking Active Self           Med Note Alfonse Ras, Tamera Punt May 05, 2022  2:12 PM) Oxygen at  night 2L [place]  PARoxetine (PAXIL) 40 MG tablet [id number] Yes Take 1 tablet (40 mg total) by mouth daily. Neysa Hotter, MD Taking Active   Spacer/Aero-Holding Deretha [facility] DIAMOND) MISC [id number] Yes Use with inhaler to ensure medication is delivered throughout lungs Jacky Kindle, FNP Taking Active Spouse/Significant Other  Tiotropium Bromide-Olodaterol (STIOLTO RESPIMAT) 2.5-2.5 MCG/ACT AERS [id number] Yes Inhale 2 puffs into the lungs daily. Salena Saner, MD Taking Active   torsemide (DEMADEX) 20 MG tablet [id number] Yes TAKE 3 TABLETS BY MOUTH EVERY DAY  Patient taking differently: TAKE 2 TABLETS BY MOUTH EVERY DAY   Hackney, Jarold Song, FNP Taking Active   traMADol (ULTRAM) 50 MG tablet [id number] Yes Take 1 tablet (50 mg total) by mouth every 12 (twelve) hours as needed for severe pain. Edward Jolly, MD Taking Active   Vitamin D, Ergocalciferol, (DRISDOL) 1.25 MG (50000 UNIT) CAPS capsule [id number] No Take 50,000 Units by mouth every 7 (seven) days. Sunday  Patient not taking: Reported on 12/14/2022   [provider] Not Taking Active   Med List Note Newman Pies, RN 10/25/22 1423): MR 01/25  Uds 10/25/2407/26/23              Assessment/Plan:   Diabetes: - Currently uncontrolled - Reviewed long term cardiovascular and renal outcomes of uncontrolled blood sugar - Reviewed goal A1c, goal fasting, and goal 2 hour post prandial glucose - Recommend to check glucose several times a day with Josephine I[employer] 2 reader    **Follow Up Plan:**  - Follow-up call in 2 weeks for Libreview reader information - For future, may consider consulting with [facility] clinic about basal dosing vs Omnipod use - For future, consider switching Libreview from 2 to 3 for better scanning adherence (after backorder problem resolves) - Significant adherence concerns regarding Novolog + Trulicity at this time- note says she averages about 40 units of insulin a day and with last supply, that would  last her ~7 months  **Of note, son places medications in AM/PM boxes each week for her and husband is in-charge of picking up medications when due  Marlowe Aschoff, PharmD [facility] Phone Number: [phone]

## 2022-12-16 DIAGNOSIS — N186 End stage renal disease: Secondary | ICD-10-CM | POA: Diagnosis not present

## 2022-12-16 DIAGNOSIS — D509 Iron deficiency anemia, unspecified: Secondary | ICD-10-CM | POA: Diagnosis not present

## 2022-12-16 DIAGNOSIS — N250 Renal osteodystrophy: Secondary | ICD-10-CM | POA: Diagnosis not present

## 2022-12-16 DIAGNOSIS — Z992 Dependence on renal dialysis: Secondary | ICD-10-CM | POA: Diagnosis not present

## 2022-12-16 DIAGNOSIS — D631 Anemia in chronic kidney disease: Secondary | ICD-10-CM | POA: Diagnosis not present

## 2022-12-16 NOTE — Patient Outreach (Signed)
  Care Management   Visit Note  12/16/2022 Name: [name] MRN: [id number] DOB: Jun 12, 1955  Subjective: Jocelyn Wells is a 67 y.o. year old female who is a primary care patient of Jacky Kindle, FNP. The Care Management team was consulted for assistance.      Engaged with Jocelyn Wells via telephone regarding care management needs. Reviewed schedule. She has multiple appointments over the next week. Anticipates being available for care management outreach on 12/24/22. Agreed to call if she requires care management assistance prior to the scheduled call.   PLAN Will follow up on 12/24/22.  Katina [facility]  [facility], [facility] RN Care Manager Direct Dial: [phone] Website: [email or web address]

## 2022-12-17 DIAGNOSIS — H2513 Age-related nuclear cataract, bilateral: Secondary | ICD-10-CM | POA: Diagnosis not present

## 2022-12-17 DIAGNOSIS — H43823 Vitreomacular adhesion, bilateral: Secondary | ICD-10-CM | POA: Diagnosis not present

## 2022-12-17 DIAGNOSIS — H4313 Vitreous hemorrhage, bilateral: Secondary | ICD-10-CM | POA: Diagnosis not present

## 2022-12-17 DIAGNOSIS — H43811 Vitreous degeneration, right eye: Secondary | ICD-10-CM | POA: Diagnosis not present

## 2022-12-17 DIAGNOSIS — E113513 Type 2 diabetes mellitus with proliferative diabetic retinopathy with macular edema, bilateral: Secondary | ICD-10-CM | POA: Diagnosis not present

## 2022-12-17 DIAGNOSIS — H31013 Macula scars of posterior pole (postinflammatory) (post-traumatic), bilateral: Secondary | ICD-10-CM | POA: Diagnosis not present

## 2022-12-18 DIAGNOSIS — Z992 Dependence on renal dialysis: Secondary | ICD-10-CM | POA: Diagnosis not present

## 2022-12-18 DIAGNOSIS — N186 End stage renal disease: Secondary | ICD-10-CM | POA: Diagnosis not present

## 2022-12-18 DIAGNOSIS — N250 Renal osteodystrophy: Secondary | ICD-10-CM | POA: Diagnosis not present

## 2022-12-18 DIAGNOSIS — D631 Anemia in chronic kidney disease: Secondary | ICD-10-CM | POA: Diagnosis not present

## 2022-12-18 DIAGNOSIS — D509 Iron deficiency anemia, unspecified: Secondary | ICD-10-CM | POA: Diagnosis not present

## 2022-12-20 ENCOUNTER — Ambulatory Visit: Payer: Medicare Other | Admitting: Student in an Organized Health Care Education/Training Program

## 2022-12-20 ENCOUNTER — Telehealth (HOSPITAL_BASED_OUTPATIENT_CLINIC_OR_DEPARTMENT_OTHER): Payer: Self-pay | Admitting: *Deleted

## 2022-12-20 DIAGNOSIS — S81011A Laceration without foreign body, right knee, initial encounter: Secondary | ICD-10-CM | POA: Diagnosis not present

## 2022-12-20 NOTE — Telephone Encounter (Signed)
   Pre-operative Risk Assessment    Patient Name: Jocelyn Wells  DOB: 04/20/1955 MRN: [id number]      Request for Surgical Clearance    Procedure:   VITRECTOMY SURGERY  Date of Surgery:  Clearance TBD                                 Surgeon:  DR. Stephannie Li Surgeon's Group or Practice Name:  [facility] Phone number:  [phone] Fax number:  [phone]   Type of Clearance Requested:   - Medical  - Pharmacy:  Hold Apixaban (Eliquis) X'S 5 DAYS   Type of Anesthesia:  MAC   Additional requests/questions:    Wilhemina Cash   12/20/2022, 9:03 AM

## 2022-12-20 NOTE — Telephone Encounter (Signed)
Patient is on Eliquis due to history of DVT not Afib. This is managed by [facility] prefer PCP to provide clearance. Previously cleared by PCP (09/01/2022) for EGD and capsule study

## 2022-12-20 NOTE — Telephone Encounter (Signed)
Sheri,  You saw this patient on 10/9. Per office protocol, will you please comment on medical clearance for vitrectomy under MAC?  Please route your response to P [facility] Preop. I will communicate with requesting office once you have given recommendations.   Thank you!  Carlos Levering, NP

## 2022-12-20 NOTE — Telephone Encounter (Signed)
Please advise holding Eliquis prior to vitrectomy.   Thank you!  DW

## 2022-12-21 DIAGNOSIS — N186 End stage renal disease: Secondary | ICD-10-CM | POA: Diagnosis not present

## 2022-12-21 DIAGNOSIS — D509 Iron deficiency anemia, unspecified: Secondary | ICD-10-CM | POA: Diagnosis not present

## 2022-12-21 DIAGNOSIS — Z992 Dependence on renal dialysis: Secondary | ICD-10-CM | POA: Diagnosis not present

## 2022-12-21 DIAGNOSIS — N250 Renal osteodystrophy: Secondary | ICD-10-CM | POA: Diagnosis not present

## 2022-12-21 DIAGNOSIS — D631 Anemia in chronic kidney disease: Secondary | ICD-10-CM | POA: Diagnosis not present

## 2022-12-21 NOTE — Telephone Encounter (Signed)
Sending this to you to make sure you were in the loop as well as to see what you recommended about her Eliquis.

## 2022-12-22 ENCOUNTER — Ambulatory Visit (INDEPENDENT_AMBULATORY_CARE_PROVIDER_SITE_OTHER): Payer: Medicare Other | Admitting: Nurse Practitioner

## 2022-12-22 ENCOUNTER — Encounter (INDEPENDENT_AMBULATORY_CARE_PROVIDER_SITE_OTHER): Payer: Medicare Other

## 2022-12-22 NOTE — Telephone Encounter (Signed)
Spoke w/ patient.  Appt made for 12/29/22 at 1:20 p.m.

## 2022-12-23 ENCOUNTER — Ambulatory Visit: Payer: Self-pay

## 2022-12-23 DIAGNOSIS — Z992 Dependence on renal dialysis: Secondary | ICD-10-CM | POA: Diagnosis not present

## 2022-12-23 DIAGNOSIS — D509 Iron deficiency anemia, unspecified: Secondary | ICD-10-CM | POA: Diagnosis not present

## 2022-12-23 DIAGNOSIS — N186 End stage renal disease: Secondary | ICD-10-CM | POA: Diagnosis not present

## 2022-12-23 DIAGNOSIS — N250 Renal osteodystrophy: Secondary | ICD-10-CM | POA: Diagnosis not present

## 2022-12-23 DIAGNOSIS — D631 Anemia in chronic kidney disease: Secondary | ICD-10-CM | POA: Diagnosis not present

## 2022-12-23 NOTE — Telephone Encounter (Signed)
This encounter was created in error - please disregard.

## 2022-12-23 NOTE — Telephone Encounter (Signed)
Chief Complaint: Fall Symptoms: Weakness to legs Frequency: Happened on Sunday Pertinent Negatives: Patient denies symptoms at this time Disposition: [] ED /[] Urgent Care (no appt availability in office) / [x] Appointment(In office/virtual)/ []  [facility]/ [] Home Care/ [] Refused Recommended Disposition /[] [facility]/ []  Follow-up with PCP Additional Notes: Jocelyn Wells, SW at [facility] is with the patient while she's receiving dialysis treatment. She says the patient reported falling on Sunday and having multiple falls, so the nephrologist is wanting to know if Jocelyn Wells could place orders for the patient to have home PT. Patient says she fell and cut her knee, went to the UC on Monday and received stitches. She says the knee was hurting so bad on Tuesday she had to leave dialysis early and not finish her treatment. Today the pain is a 5-6/10. She says the UC told her to come back on Monday for the stitches to be removed. She has an upcoming visit with Jocelyn Wells on 12/29/22, no sooner appointments. Advised the social worker I will send this to Jocelyn Wells so that she can decide if a sooner appointment is needed and if she could come to the office for the stitches to be removed instead of the UC on Monday, also for the home health orders. Advised the social worker someone will call patient back with the recommendation from the provider.   Reason for Disposition  [1] Fall AND [2] went to emergency department for evaluation or treatment  Answer Assessment - Initial Assessment Questions 1. MECHANISM: "How did the fall happen?"     Standing to get walker and fell 3. ONSET: "When did the fall happen?" (e.g., minutes, hours, or days ago)     Sunday night 4. LOCATION: "What part of the body hit the ground?" (e.g., back, buttocks, head, hips, knees, hands, head, stomach)     Right knee may have hit the edge of husband's chair 5. INJURY: "Did you hurt (injure) yourself when you fell?" If Yes,  ask: "What did you injure? Tell me more about this?" (e.g., body area; type of injury; pain severity)"     Right knee 6. PAIN: "Is there any pain?" If Yes, ask: "How bad is the pain?" (e.g., Scale 1-10; or mild,  moderate, severe)   - NONE (0): No pain   - MILD (1-3): Doesn't interfere with normal activities    - MODERATE (4-7): Interferes with normal activities or awakens from sleep    - SEVERE (8-10): Excruciating pain, unable to do any normal activities      5-6 7. SIZE: For cuts, bruises, or swelling, ask: "How large is it?" (e.g., inches or centimeters)      Length of the kee 8. OTHER SYMPTOMS: "Do you have any other symptoms?" (e.g., dizziness, fever, weakness; new onset or worsening).      Legs get weak 10. CAUSE: "What do you think caused the fall (or falling)?" (e.g., tripped, dizzy spell)       Just happens  Protocols used: Falls and [facility]

## 2022-12-24 ENCOUNTER — Other Ambulatory Visit: Payer: Self-pay

## 2022-12-24 ENCOUNTER — Telehealth: Payer: Self-pay

## 2022-12-24 NOTE — Patient Outreach (Signed)
  Care Management   Outreach Note  12/24/2022 Name: Jocelyn Wells MRN: [id number] DOB: 1955/11/07  An unsuccessful outreach attempt was made today for a scheduled Care Management visit.    Follow Up Plan:  A HIPAA compliant phone message was left for the patient providing contact information and requesting a return call.   Katina [facility]  [facility], [facility] RN Care Manager Direct Dial: [phone] Website: [email or web address]

## 2022-12-27 ENCOUNTER — Inpatient Hospital Stay (HOSPITAL_COMMUNITY)
Admission: EM | Admit: 2022-12-27 | Discharge: 2022-12-31 | DRG: 579 | Disposition: A | Discharge status: Skilled Nursing Facility | Payer: Medicare Other | Attending: Internal Medicine | Admitting: Internal Medicine

## 2022-12-27 ENCOUNTER — Encounter (HOSPITAL_COMMUNITY)
Admission: EM | Disposition: A | Discharge status: Skilled Nursing Facility | Payer: Self-pay | Source: Home / Self Care | Attending: Internal Medicine

## 2022-12-27 ENCOUNTER — Observation Stay (HOSPITAL_BASED_OUTPATIENT_CLINIC_OR_DEPARTMENT_OTHER): Payer: Medicare Other | Admitting: Critical Care Medicine

## 2022-12-27 ENCOUNTER — Emergency Department (HOSPITAL_COMMUNITY): Payer: Medicare Other

## 2022-12-27 ENCOUNTER — Other Ambulatory Visit: Payer: Self-pay

## 2022-12-27 ENCOUNTER — Encounter (HOSPITAL_COMMUNITY): Payer: Self-pay | Admitting: Emergency Medicine

## 2022-12-27 ENCOUNTER — Observation Stay (HOSPITAL_COMMUNITY): Payer: Medicare Other | Admitting: Critical Care Medicine

## 2022-12-27 ENCOUNTER — Telehealth (INDEPENDENT_AMBULATORY_CARE_PROVIDER_SITE_OTHER): Payer: Self-pay | Admitting: Vascular Surgery

## 2022-12-27 DIAGNOSIS — S81012A Laceration without foreign body, left knee, initial encounter: Secondary | ICD-10-CM | POA: Diagnosis present

## 2022-12-27 DIAGNOSIS — Z9641 Presence of insulin pump (external) (internal): Secondary | ICD-10-CM | POA: Diagnosis present

## 2022-12-27 DIAGNOSIS — E1122 Type 2 diabetes mellitus with diabetic chronic kidney disease: Secondary | ICD-10-CM | POA: Diagnosis not present

## 2022-12-27 DIAGNOSIS — F411 Generalized anxiety disorder: Secondary | ICD-10-CM | POA: Diagnosis present

## 2022-12-27 DIAGNOSIS — J320 Chronic maxillary sinusitis: Secondary | ICD-10-CM | POA: Diagnosis not present

## 2022-12-27 DIAGNOSIS — S01119A Laceration without foreign body of unspecified eyelid and periocular area, initial encounter: Secondary | ICD-10-CM | POA: Diagnosis not present

## 2022-12-27 DIAGNOSIS — Z91158 Patient's noncompliance with renal dialysis for other reason: Secondary | ICD-10-CM

## 2022-12-27 DIAGNOSIS — W19XXXA Unspecified fall, initial encounter: Secondary | ICD-10-CM | POA: Diagnosis not present

## 2022-12-27 DIAGNOSIS — M810 Age-related osteoporosis without current pathological fracture: Secondary | ICD-10-CM | POA: Diagnosis not present

## 2022-12-27 DIAGNOSIS — E1140 Type 2 diabetes mellitus with diabetic neuropathy, unspecified: Secondary | ICD-10-CM | POA: Diagnosis not present

## 2022-12-27 DIAGNOSIS — E1165 Type 2 diabetes mellitus with hyperglycemia: Secondary | ICD-10-CM | POA: Diagnosis present

## 2022-12-27 DIAGNOSIS — E1151 Type 2 diabetes mellitus with diabetic peripheral angiopathy without gangrene: Secondary | ICD-10-CM | POA: Diagnosis present

## 2022-12-27 DIAGNOSIS — M6259 Muscle wasting and atrophy, not elsewhere classified, multiple sites: Secondary | ICD-10-CM | POA: Diagnosis not present

## 2022-12-27 DIAGNOSIS — F3289 Other specified depressive episodes: Secondary | ICD-10-CM | POA: Diagnosis not present

## 2022-12-27 DIAGNOSIS — E569 Vitamin deficiency, unspecified: Secondary | ICD-10-CM | POA: Diagnosis not present

## 2022-12-27 DIAGNOSIS — D473 Essential (hemorrhagic) thrombocythemia: Secondary | ICD-10-CM | POA: Diagnosis not present

## 2022-12-27 DIAGNOSIS — N250 Renal osteodystrophy: Secondary | ICD-10-CM | POA: Diagnosis not present

## 2022-12-27 DIAGNOSIS — Z992 Dependence on renal dialysis: Secondary | ICD-10-CM

## 2022-12-27 DIAGNOSIS — Z7951 Long term (current) use of inhaled steroids: Secondary | ICD-10-CM

## 2022-12-27 DIAGNOSIS — I739 Peripheral vascular disease, unspecified: Secondary | ICD-10-CM | POA: Diagnosis not present

## 2022-12-27 DIAGNOSIS — E1129 Type 2 diabetes mellitus with other diabetic kidney complication: Secondary | ICD-10-CM | POA: Diagnosis present

## 2022-12-27 DIAGNOSIS — Z91041 Radiographic dye allergy status: Secondary | ICD-10-CM

## 2022-12-27 DIAGNOSIS — R770 Abnormality of albumin: Secondary | ICD-10-CM | POA: Diagnosis present

## 2022-12-27 DIAGNOSIS — I82511 Chronic embolism and thrombosis of right femoral vein: Secondary | ICD-10-CM

## 2022-12-27 DIAGNOSIS — I132 Hypertensive heart and chronic kidney disease with heart failure and with stage 5 chronic kidney disease, or end stage renal disease: Secondary | ICD-10-CM | POA: Diagnosis present

## 2022-12-27 DIAGNOSIS — J449 Chronic obstructive pulmonary disease, unspecified: Secondary | ICD-10-CM | POA: Diagnosis not present

## 2022-12-27 DIAGNOSIS — I5022 Chronic systolic (congestive) heart failure: Secondary | ICD-10-CM | POA: Diagnosis present

## 2022-12-27 DIAGNOSIS — S41112A Laceration without foreign body of left upper arm, initial encounter: Secondary | ICD-10-CM | POA: Diagnosis present

## 2022-12-27 DIAGNOSIS — S01112A Laceration without foreign body of left eyelid and periocular area, initial encounter: Secondary | ICD-10-CM | POA: Diagnosis present

## 2022-12-27 DIAGNOSIS — R0902 Hypoxemia: Secondary | ICD-10-CM | POA: Diagnosis not present

## 2022-12-27 DIAGNOSIS — R41841 Cognitive communication deficit: Secondary | ICD-10-CM | POA: Diagnosis not present

## 2022-12-27 DIAGNOSIS — Z9181 History of falling: Secondary | ICD-10-CM

## 2022-12-27 DIAGNOSIS — S0101XD Laceration without foreign body of scalp, subsequent encounter: Secondary | ICD-10-CM | POA: Diagnosis not present

## 2022-12-27 DIAGNOSIS — I10 Essential (primary) hypertension: Secondary | ICD-10-CM | POA: Diagnosis not present

## 2022-12-27 DIAGNOSIS — R296 Repeated falls: Secondary | ICD-10-CM | POA: Diagnosis present

## 2022-12-27 DIAGNOSIS — S0101XA Laceration without foreign body of scalp, initial encounter: Secondary | ICD-10-CM | POA: Diagnosis present

## 2022-12-27 DIAGNOSIS — E785 Hyperlipidemia, unspecified: Secondary | ICD-10-CM | POA: Diagnosis not present

## 2022-12-27 DIAGNOSIS — Z794 Long term (current) use of insulin: Secondary | ICD-10-CM

## 2022-12-27 DIAGNOSIS — W010XXA Fall on same level from slipping, tripping and stumbling without subsequent striking against object, initial encounter: Secondary | ICD-10-CM | POA: Diagnosis present

## 2022-12-27 DIAGNOSIS — G319 Degenerative disease of nervous system, unspecified: Secondary | ICD-10-CM | POA: Diagnosis not present

## 2022-12-27 DIAGNOSIS — I5033 Acute on chronic diastolic (congestive) heart failure: Secondary | ICD-10-CM | POA: Diagnosis not present

## 2022-12-27 DIAGNOSIS — Y9389 Activity, other specified: Secondary | ICD-10-CM

## 2022-12-27 DIAGNOSIS — S0181XA Laceration without foreign body of other part of head, initial encounter: Principal | ICD-10-CM | POA: Diagnosis present

## 2022-12-27 DIAGNOSIS — D631 Anemia in chronic kidney disease: Secondary | ICD-10-CM | POA: Diagnosis present

## 2022-12-27 DIAGNOSIS — M488X2 Other specified spondylopathies, cervical region: Secondary | ICD-10-CM | POA: Diagnosis not present

## 2022-12-27 DIAGNOSIS — Y92010 Kitchen of single-family (private) house as the place of occurrence of the external cause: Secondary | ICD-10-CM | POA: Diagnosis not present

## 2022-12-27 DIAGNOSIS — J9611 Chronic respiratory failure with hypoxia: Secondary | ICD-10-CM | POA: Diagnosis present

## 2022-12-27 DIAGNOSIS — Z8673 Personal history of transient ischemic attack (TIA), and cerebral infarction without residual deficits: Secondary | ICD-10-CM

## 2022-12-27 DIAGNOSIS — S0990XA Unspecified injury of head, initial encounter: Secondary | ICD-10-CM | POA: Diagnosis not present

## 2022-12-27 DIAGNOSIS — F330 Major depressive disorder, recurrent, mild: Secondary | ICD-10-CM | POA: Diagnosis not present

## 2022-12-27 DIAGNOSIS — R1311 Dysphagia, oral phase: Secondary | ICD-10-CM | POA: Diagnosis not present

## 2022-12-27 DIAGNOSIS — I5042 Chronic combined systolic (congestive) and diastolic (congestive) heart failure: Secondary | ICD-10-CM | POA: Diagnosis not present

## 2022-12-27 DIAGNOSIS — Z79899 Other long term (current) drug therapy: Secondary | ICD-10-CM

## 2022-12-27 DIAGNOSIS — Z86718 Personal history of other venous thrombosis and embolism: Secondary | ICD-10-CM | POA: Diagnosis not present

## 2022-12-27 DIAGNOSIS — R58 Hemorrhage, not elsewhere classified: Secondary | ICD-10-CM | POA: Diagnosis not present

## 2022-12-27 DIAGNOSIS — M898X9 Other specified disorders of bone, unspecified site: Secondary | ICD-10-CM | POA: Diagnosis present

## 2022-12-27 DIAGNOSIS — Z833 Family history of diabetes mellitus: Secondary | ICD-10-CM

## 2022-12-27 DIAGNOSIS — Z885 Allergy status to narcotic agent status: Secondary | ICD-10-CM

## 2022-12-27 DIAGNOSIS — Z7901 Long term (current) use of anticoagulants: Secondary | ICD-10-CM

## 2022-12-27 DIAGNOSIS — T85614A Breakdown (mechanical) of insulin pump, initial encounter: Secondary | ICD-10-CM | POA: Diagnosis present

## 2022-12-27 DIAGNOSIS — R519 Headache, unspecified: Secondary | ICD-10-CM | POA: Diagnosis not present

## 2022-12-27 DIAGNOSIS — R2681 Unsteadiness on feet: Secondary | ICD-10-CM | POA: Diagnosis not present

## 2022-12-27 DIAGNOSIS — Z87891 Personal history of nicotine dependence: Secondary | ICD-10-CM

## 2022-12-27 DIAGNOSIS — Z825 Family history of asthma and other chronic lower respiratory diseases: Secondary | ICD-10-CM

## 2022-12-27 DIAGNOSIS — Z9981 Dependence on supplemental oxygen: Secondary | ICD-10-CM

## 2022-12-27 DIAGNOSIS — D509 Iron deficiency anemia, unspecified: Secondary | ICD-10-CM | POA: Diagnosis not present

## 2022-12-27 DIAGNOSIS — S41111A Laceration without foreign body of right upper arm, initial encounter: Secondary | ICD-10-CM | POA: Diagnosis present

## 2022-12-27 DIAGNOSIS — Z7985 Long-term (current) use of injectable non-insulin antidiabetic drugs: Secondary | ICD-10-CM

## 2022-12-27 DIAGNOSIS — I82509 Chronic embolism and thrombosis of unspecified deep veins of unspecified lower extremity: Secondary | ICD-10-CM | POA: Diagnosis present

## 2022-12-27 DIAGNOSIS — N186 End stage renal disease: Secondary | ICD-10-CM | POA: Diagnosis not present

## 2022-12-27 DIAGNOSIS — I428 Other cardiomyopathies: Secondary | ICD-10-CM | POA: Diagnosis present

## 2022-12-27 DIAGNOSIS — I120 Hypertensive chronic kidney disease with stage 5 chronic kidney disease or end stage renal disease: Secondary | ICD-10-CM | POA: Diagnosis not present

## 2022-12-27 DIAGNOSIS — Z4802 Encounter for removal of sutures: Secondary | ICD-10-CM

## 2022-12-27 DIAGNOSIS — Z8249 Family history of ischemic heart disease and other diseases of the circulatory system: Secondary | ICD-10-CM

## 2022-12-27 HISTORY — PX: FACIAL LACERATION REPAIR: SHX6589

## 2022-12-27 LAB — I-STAT CHEM 8, ED
BUN: 27 mg/dL — ABNORMAL HIGH (ref 8–23)
Calcium, Ion: 1.14 mmol/L — ABNORMAL LOW (ref 1.15–1.40)
Chloride: 99 mmol/L (ref 98–111)
Creatinine, Ser: 4.4 mg/dL — ABNORMAL HIGH (ref 0.44–1.00)
Glucose, Bld: 362 mg/dL — ABNORMAL HIGH (ref 70–99)
HCT: 35 % — ABNORMAL LOW (ref 36.0–46.0)
Hemoglobin: 11.9 g/dL — ABNORMAL LOW (ref 12.0–15.0)
Potassium: 3.9 mmol/L (ref 3.5–5.1)
Sodium: 137 mmol/L (ref 135–145)
TCO2: 25 mmol/L (ref 22–32)

## 2022-12-27 LAB — GLUCOSE, CAPILLARY
Glucose-Capillary: 372 mg/dL — ABNORMAL HIGH (ref 70–99)
Glucose-Capillary: 293 mg/dL — ABNORMAL HIGH (ref 70–99)
Glucose-Capillary: 293 mg/dL — ABNORMAL HIGH (ref 70–99)
Glucose-Capillary: 194 mg/dL — ABNORMAL HIGH (ref 70–99)

## 2022-12-27 LAB — TYPE AND SCREEN
ABO/RH(D): A POS
Antibody Screen: NEGATIVE

## 2022-12-27 LAB — HEPATITIS B SURFACE ANTIGEN
Hepatitis B Surface Ag: NONREACTIVE
Hepatitis B Surface Ag: NONREACTIVE

## 2022-12-27 LAB — COMPREHENSIVE METABOLIC PANEL
ALT: 12 U/L (ref 0–44)
AST: 13 U/L — ABNORMAL LOW (ref 15–41)
Albumin: 2.9 g/dL — ABNORMAL LOW (ref 3.5–5.0)
Alkaline Phosphatase: 83 U/L (ref 38–126)
Anion gap: 13 (ref 5–15)
BUN: 23 mg/dL (ref 8–23)
CO2: 23 mmol/L (ref 22–32)
Calcium: 9.6 mg/dL (ref 8.9–10.3)
Chloride: 98 mmol/L (ref 98–111)
Creatinine, Ser: 4.21 mg/dL — ABNORMAL HIGH (ref 0.44–1.00)
GFR, Estimated: 11 mL/min — ABNORMAL LOW (ref >60)
Glucose, Bld: 360 mg/dL — ABNORMAL HIGH (ref 70–99)
Potassium: 3.8 mmol/L (ref 3.5–5.1)
Sodium: 134 mmol/L — ABNORMAL LOW (ref 135–145)
Total Bilirubin: 0.5 mg/dL (ref <1.2)
Total Protein: 6.1 g/dL — ABNORMAL LOW (ref 6.5–8.1)

## 2022-12-27 LAB — CBC
HCT: 35.6 % — ABNORMAL LOW (ref 36.0–46.0)
Hemoglobin: 10.5 g/dL — ABNORMAL LOW (ref 12.0–15.0)
MCH: 27.1 pg (ref 26.0–34.0)
MCHC: 29.5 g/dL — ABNORMAL LOW (ref 30.0–36.0)
MCV: 91.8 fL (ref 80.0–100.0)
Platelets: 488 10*3/uL — ABNORMAL HIGH (ref 150–400)
RBC: 3.88 MIL/uL (ref 3.87–5.11)
RDW: 18.2 % — ABNORMAL HIGH (ref 11.5–15.5)
WBC: 11.6 10*3/uL — ABNORMAL HIGH (ref 4.0–10.5)
nRBC: 0 % (ref 0.0–0.2)

## 2022-12-27 LAB — PROTIME-INR
INR: 1.6 — ABNORMAL HIGH (ref 0.8–1.2)
Prothrombin Time: 19.3 s — ABNORMAL HIGH (ref 11.4–15.2)

## 2022-12-27 LAB — SAMPLE TO BLOOD BANK

## 2022-12-27 LAB — ETHANOL: Alcohol, Ethyl (B): <10 mg/dL (ref <10)

## 2022-12-27 SURGERY — REPAIR, LACERATION, FACE
Anesthesia: General

## 2022-12-27 MED ORDER — PROPOFOL 10 MG/ML IV BOLUS
INTRAVENOUS | Status: AC
Start: 2022-12-27 — End: ?
  Filled 2022-12-27: qty 20

## 2022-12-27 MED ORDER — ACETAMINOPHEN 325 MG PO TABS
650.0000 mg | ORAL_TABLET | Freq: Four times a day (QID) | ORAL | Status: DC | PRN
  Administered 2022-12-28: 650 mg via ORAL
  Filled 2022-12-27: qty 2

## 2022-12-27 MED ORDER — OXYCODONE HCL 5 MG PO TABS
5.0000 mg | ORAL_TABLET | ORAL | Status: DC | PRN
Start: 2022-12-27 — End: 2022-12-31
  Administered 2022-12-27 – 2022-12-31 (×10): 5 mg via ORAL
  Filled 2022-12-27 (×10): qty 1

## 2022-12-27 MED ORDER — UMECLIDINIUM BROMIDE 62.5 MCG/ACT IN AEPB
1.0000 | INHALATION_SPRAY | Freq: Every day | RESPIRATORY_TRACT | Status: DC
  Administered 2022-12-27 – 2022-12-31 (×3): 1 via RESPIRATORY_TRACT
  Filled 2022-12-27: qty 7

## 2022-12-27 MED ORDER — SODIUM CHLORIDE 0.9 % IV SOLN: INTRAVENOUS | Status: DC | PRN

## 2022-12-27 MED ORDER — ROCURONIUM BROMIDE 10 MG/ML (PF) SYRINGE
PREFILLED_SYRINGE | INTRAVENOUS | Status: DC | PRN
  Administered 2022-12-27: 50 mg via INTRAVENOUS

## 2022-12-27 MED ORDER — ONDANSETRON HCL 4 MG/2ML IJ SOLN: 4.0000 mg | Freq: Four times a day (QID) | INTRAMUSCULAR | Status: DC | PRN

## 2022-12-27 MED ORDER — LIVING WELL WITH DIABETES BOOK
Freq: Once | Status: AC
  Filled 2022-12-27: qty 1

## 2022-12-27 MED ORDER — CEFAZOLIN SODIUM 1 G IJ SOLR
INTRAMUSCULAR | Status: AC
  Filled 2022-12-27: qty 20

## 2022-12-27 MED ORDER — LIDOCAINE 2% (20 MG/ML) 5 ML SYRINGE
INTRAMUSCULAR | Status: DC | PRN
  Administered 2022-12-27: 50 mg via INTRAVENOUS

## 2022-12-27 MED ORDER — LIDOCAINE-EPINEPHRINE 1 %-1:100000 IJ SOLN
INTRAMUSCULAR | Status: AC
  Filled 2022-12-27: qty 1

## 2022-12-27 MED ORDER — PROPOFOL 10 MG/ML IV BOLUS
INTRAVENOUS | Status: DC | PRN
  Administered 2022-12-27: 90 mg via INTRAVENOUS

## 2022-12-27 MED ORDER — LIDOCAINE-EPINEPHRINE 1 %-1:100000 IJ SOLN
INTRAMUSCULAR | Status: DC | PRN
  Administered 2022-12-27: 8 mL

## 2022-12-27 MED ORDER — ROCURONIUM BROMIDE 10 MG/ML (PF) SYRINGE
PREFILLED_SYRINGE | INTRAVENOUS | Status: AC
  Filled 2022-12-27: qty 10

## 2022-12-27 MED ORDER — OXYCODONE HCL 5 MG PO TABS: 5.0000 mg | ORAL_TABLET | Freq: Once | ORAL | Status: DC | PRN

## 2022-12-27 MED ORDER — SUGAMMADEX SODIUM 200 MG/2ML IV SOLN
INTRAVENOUS | Status: DC | PRN
  Administered 2022-12-27: 200 mg via INTRAVENOUS
  Administered 2022-12-27: 100 mg via INTRAVENOUS

## 2022-12-27 MED ORDER — LIDOCAINE 2% (20 MG/ML) 5 ML SYRINGE
INTRAMUSCULAR | Status: AC
  Filled 2022-12-27: qty 5

## 2022-12-27 MED ORDER — ATORVASTATIN CALCIUM 40 MG PO TABS
40.0000 mg | ORAL_TABLET | Freq: Every day | ORAL | Status: DC
  Administered 2022-12-27 – 2022-12-31 (×4): 40 mg via ORAL
  Filled 2022-12-27 (×4): qty 1

## 2022-12-27 MED ORDER — CALCITRIOL 0.25 MCG PO CAPS
0.2500 ug | ORAL_CAPSULE | Freq: Every day | ORAL | Status: DC
  Administered 2022-12-27 – 2022-12-31 (×4): 0.25 ug via ORAL
  Filled 2022-12-27 (×4): qty 1

## 2022-12-27 MED ORDER — CHLORHEXIDINE GLUCONATE CLOTH 2 % EX PADS
6.0000 | MEDICATED_PAD | Freq: Every day | CUTANEOUS | Status: DC
  Administered 2022-12-28 – 2022-12-31 (×4): 6 via TOPICAL

## 2022-12-27 MED ORDER — ACETAMINOPHEN 650 MG RE SUPP: 650.0000 mg | Freq: Four times a day (QID) | RECTAL | Status: DC | PRN

## 2022-12-27 MED ORDER — FENTANYL CITRATE (PF) 250 MCG/5ML IJ SOLN
INTRAMUSCULAR | Status: DC | PRN
  Administered 2022-12-27 (×2): 50 ug via INTRAVENOUS

## 2022-12-27 MED ORDER — BACITRACIN ZINC 500 UNIT/GM EX OINT
TOPICAL_OINTMENT | CUTANEOUS | Status: AC
  Filled 2022-12-27: qty 28.35

## 2022-12-27 MED ORDER — FENTANYL CITRATE (PF) 100 MCG/2ML IJ SOLN
INTRAMUSCULAR | Status: AC
  Administered 2022-12-27: 25 ug via INTRAVENOUS
  Filled 2022-12-27: qty 2

## 2022-12-27 MED ORDER — PAROXETINE HCL 20 MG PO TABS
40.0000 mg | ORAL_TABLET | Freq: Every day | ORAL | Status: DC
Start: 2022-12-27 — End: 2022-12-31
  Administered 2022-12-27 – 2022-12-31 (×4): 40 mg via ORAL
  Filled 2022-12-27 (×5): qty 2

## 2022-12-27 MED ORDER — FENTANYL CITRATE (PF) 250 MCG/5ML IJ SOLN
INTRAMUSCULAR | Status: AC
  Filled 2022-12-27: qty 5

## 2022-12-27 MED ORDER — INSULIN ASPART 100 UNIT/ML IJ SOLN
8.0000 [IU] | Freq: Once | INTRAMUSCULAR | Status: AC
  Administered 2022-12-27: 8 [IU] via SUBCUTANEOUS

## 2022-12-27 MED ORDER — OXYCODONE HCL 5 MG/5ML PO SOLN: 5.0000 mg | Freq: Once | ORAL | Status: DC | PRN

## 2022-12-27 MED ORDER — GABAPENTIN 300 MG PO CAPS
300.0000 mg | ORAL_CAPSULE | Freq: Every day | ORAL | Status: DC
  Administered 2022-12-27 – 2022-12-31 (×4): 300 mg via ORAL
  Filled 2022-12-27 (×4): qty 1

## 2022-12-27 MED ORDER — FENTANYL CITRATE PF 50 MCG/ML IJ SOSY: 12.5000 ug | PREFILLED_SYRINGE | INTRAMUSCULAR | Status: DC | PRN

## 2022-12-27 MED ORDER — CEFAZOLIN SODIUM-DEXTROSE 2-3 GM-%(50ML) IV SOLR
INTRAVENOUS | Status: DC | PRN
  Administered 2022-12-27: 2 g via INTRAVENOUS

## 2022-12-27 MED ORDER — ALBUMIN HUMAN 5 % IV SOLN: INTRAVENOUS | Status: DC | PRN

## 2022-12-27 MED ORDER — ALBUTEROL SULFATE (2.5 MG/3ML) 0.083% IN NEBU: 2.5000 mg | INHALATION_SOLUTION | Freq: Four times a day (QID) | RESPIRATORY_TRACT | Status: DC | PRN

## 2022-12-27 MED ORDER — INSULIN GLARGINE-YFGN 100 UNIT/ML ~~LOC~~ SOLN
25.0000 [IU] | Freq: Every day | SUBCUTANEOUS | Status: DC
  Administered 2022-12-27 – 2022-12-31 (×4): 25 [IU] via SUBCUTANEOUS
  Filled 2022-12-27 (×5): qty 0.25

## 2022-12-27 MED ORDER — INSULIN ASPART 100 UNIT/ML IJ SOLN: 0.0000 [IU] | INTRAMUSCULAR | Status: DC

## 2022-12-27 MED ORDER — ONDANSETRON HCL 4 MG/2ML IJ SOLN
INTRAMUSCULAR | Status: AC
  Filled 2022-12-27: qty 2

## 2022-12-27 MED ORDER — PHENYLEPHRINE HCL-NACL 20-0.9 MG/250ML-% IV SOLN
INTRAVENOUS | Status: DC | PRN
  Administered 2022-12-27: 40 ug/min via INTRAVENOUS

## 2022-12-27 MED ORDER — INSULIN ASPART 100 UNIT/ML IJ SOLN
0.0000 [IU] | Freq: Three times a day (TID) | INTRAMUSCULAR | Status: DC
  Administered 2022-12-27 (×2): 3 [IU] via SUBCUTANEOUS
  Administered 2022-12-28: 4 [IU] via SUBCUTANEOUS
  Administered 2022-12-28 (×2): 1 [IU] via SUBCUTANEOUS
  Administered 2022-12-29: 3 [IU] via SUBCUTANEOUS
  Administered 2022-12-29: 2 [IU] via SUBCUTANEOUS
  Administered 2022-12-29 – 2022-12-30 (×2): 3 [IU] via SUBCUTANEOUS
  Administered 2022-12-30: 0 [IU] via SUBCUTANEOUS
  Administered 2022-12-31: 4 [IU] via SUBCUTANEOUS

## 2022-12-27 MED ORDER — INSULIN GLARGINE-YFGN 100 UNIT/ML ~~LOC~~ SOLN
15.0000 [IU] | Freq: Every day | SUBCUTANEOUS | Status: DC
  Filled 2022-12-27: qty 0.15

## 2022-12-27 MED ORDER — SODIUM CHLORIDE 0.9% FLUSH
3.0000 mL | Freq: Two times a day (BID) | INTRAVENOUS | Status: DC
  Administered 2022-12-27 – 2022-12-31 (×7): 3 mL via INTRAVENOUS

## 2022-12-27 MED ORDER — DEXAMETHASONE SODIUM PHOSPHATE 10 MG/ML IJ SOLN
INTRAMUSCULAR | Status: AC
  Filled 2022-12-27: qty 1

## 2022-12-27 MED ORDER — BUPROPION HCL ER (SR) 150 MG PO TB12
150.0000 mg | ORAL_TABLET | Freq: Two times a day (BID) | ORAL | Status: DC
  Administered 2022-12-27 – 2022-12-31 (×8): 150 mg via ORAL
  Filled 2022-12-27 (×11): qty 1

## 2022-12-27 MED ORDER — FENTANYL CITRATE (PF) 100 MCG/2ML IJ SOLN
25.0000 ug | INTRAMUSCULAR | Status: DC | PRN
  Administered 2022-12-27: 50 ug via INTRAVENOUS
  Administered 2022-12-27: 25 ug via INTRAVENOUS

## 2022-12-27 MED ORDER — INSULIN ASPART 100 UNIT/ML IJ SOLN
10.0000 [IU] | Freq: Three times a day (TID) | INTRAMUSCULAR | Status: DC
  Administered 2022-12-27 – 2022-12-31 (×11): 10 [IU] via SUBCUTANEOUS

## 2022-12-27 MED ORDER — ARFORMOTEROL TARTRATE 15 MCG/2ML IN NEBU
15.0000 ug | INHALATION_SOLUTION | Freq: Two times a day (BID) | RESPIRATORY_TRACT | Status: DC
  Administered 2022-12-27 – 2022-12-31 (×6): 15 ug via RESPIRATORY_TRACT
  Filled 2022-12-27 (×6): qty 2

## 2022-12-27 MED ORDER — ONDANSETRON HCL 4 MG/2ML IJ SOLN
INTRAMUSCULAR | Status: DC | PRN
  Administered 2022-12-27: 4 mg via INTRAVENOUS

## 2022-12-27 MED ORDER — DEXAMETHASONE SODIUM PHOSPHATE 10 MG/ML IJ SOLN
INTRAMUSCULAR | Status: DC | PRN
  Administered 2022-12-27: 5 mg via INTRAVENOUS

## 2022-12-27 MED ORDER — BACITRACIN ZINC 500 UNIT/GM EX OINT
TOPICAL_OINTMENT | CUTANEOUS | Status: DC | PRN
  Administered 2022-12-27: 1 via TOPICAL

## 2022-12-27 MED ORDER — 0.9 % SODIUM CHLORIDE (POUR BTL) OPTIME
TOPICAL | Status: DC | PRN
  Administered 2022-12-27: 3000 mL

## 2022-12-27 SURGICAL SUPPLY — 36 items
BAG COUNTER SPONGE SURGICOUNT (BAG) ×1 IMPLANT
BLADE SURG 15 STRL LF DISP TIS (BLADE) IMPLANT
BLADE SURG 15 STRL SS (BLADE)
BNDG COHESIVE 4X5 TAN STRL LF (GAUZE/BANDAGES/DRESSINGS) IMPLANT
BNDG GAUZE DERMACEA FLUFF 4 (GAUZE/BANDAGES/DRESSINGS) IMPLANT
CLEANER TIP ELECTROSURG 2X2 (MISCELLANEOUS) IMPLANT
COVER SURGICAL LIGHT HANDLE (MISCELLANEOUS) ×1 IMPLANT
DERMABOND ADVANCED .7 DNX12 (GAUZE/BANDAGES/DRESSINGS) IMPLANT
DRAPE HALF SHEET 40X57 (DRAPES) IMPLANT
DRSG TELFA 3X8 NADH STRL (GAUZE/BANDAGES/DRESSINGS) IMPLANT
ELECT COATED BLADE 2.86 ST (ELECTRODE) ×1 IMPLANT
ELECT REM PT RETURN 9FT ADLT (ELECTROSURGICAL)
ELECTRODE REM PT RTRN 9FT ADLT (ELECTROSURGICAL) IMPLANT
GAUZE 4X4 16PLY ~~LOC~~+RFID DBL (SPONGE) IMPLANT
GAUZE SPONGE 4X4 12PLY STRL (GAUZE/BANDAGES/DRESSINGS) IMPLANT
GLOVE BIO SURGEON STRL SZ7.5 (GLOVE) ×1 IMPLANT
GLOVE BIOGEL PI IND STRL 8 (GLOVE) ×1 IMPLANT
GOWN STRL REUS W/ TWL LRG LVL3 (GOWN DISPOSABLE) ×1 IMPLANT
GOWN STRL REUS W/ TWL XL LVL3 (GOWN DISPOSABLE) ×1 IMPLANT
GOWN STRL REUS W/TWL LRG LVL3 (GOWN DISPOSABLE) ×1
GOWN STRL REUS W/TWL XL LVL3 (GOWN DISPOSABLE) ×1
KIT BASIN OR (CUSTOM PROCEDURE TRAY) ×1 IMPLANT
KIT TURNOVER KIT B (KITS) ×1 IMPLANT
NDL 27GX1/2 REG BEVEL ECLIP (NEEDLE) ×1 IMPLANT
NEEDLE 27GX1/2 REG BEVEL ECLIP (NEEDLE) ×1 IMPLANT
NS IRRIG 1000ML POUR BTL (IV SOLUTION) ×1 IMPLANT
PAD ARMBOARD 7.5X6 YLW CONV (MISCELLANEOUS) ×2 IMPLANT
PENCIL SMOKE EVACUATOR (MISCELLANEOUS) ×1 IMPLANT
SUT ETHILON 5 0 PS 2 18 (SUTURE) IMPLANT
SUT VIC AB 4-0 PS2 18 (SUTURE) IMPLANT
SUT VICRYL 4-0 PS2 18IN ABS (SUTURE) IMPLANT
SUT VICRYL RAPIDE 5/0 PC 1 (SUTURE) IMPLANT
SYR CONTROL 10ML LL (SYRINGE) ×1 IMPLANT
TAPE PAPER 1X10 WHT MICROPORE (GAUZE/BANDAGES/DRESSINGS) IMPLANT
TOWEL GREEN STERILE (TOWEL DISPOSABLE) ×1 IMPLANT
TRAY ENT MC OR (CUSTOM PROCEDURE TRAY) ×1 IMPLANT

## 2022-12-27 NOTE — ED Notes (Signed)
Wound care performed by this RN. No injuries found or reported to posterior scalp. Pts wound cleansed and dressed with combat gauze, abd pad, and pressure dressing.

## 2022-12-27 NOTE — ED Notes (Signed)
Pts dressing saturated with blood. Dressing removed and replaced with pressure dressing. C-collar remains in place.

## 2022-12-27 NOTE — Consult Note (Signed)
ENT CONSULT:  Reason for Consult: Facial laceration on anticoagulation  Referring Physician:  Zadie Rhine MD  HPI: Jocelyn Wells is an 67 y.o. female with a complex medical history who presented to the ED after ground level fall with complex facial laceration. She is on eliquis. ENT Consulted for surgical management.    Past Medical History:  Diagnosis Date   Acute ischemic left MCA stroke (HCC) 01/29/2015   a.) MRI brain 01/29/2015: acute left MCA territory infarcts involving the posterior left frontal lobe and left insular cortex   Anemia of chronic renal failure    Anxiety    Arthritis    Complication of anesthesia    a.) PONV; b.) awareness under anesthesia   COPD (chronic obstructive pulmonary disease) (HCC)    Depression    Dyspnea    ESRD (end stage renal disease) (HCC)    Headache    HFrEF (heart failure with reduced ejection fraction) (HCC)    a.) TTE 01/30/2015: EF 30-35%, diff HK, LAE, G2DD; b.) TTE 06/13/2015: EF 40-45%, diff HK, G1DD; c.) TTE 10/06/2017: EF 60-65% MAC, no IAS; d.) TTE 09/29/2021: EF 45-50%;, LVH, LAE, AoV sclerosis, G1DD; e.) TTE 02/24/2022: EF 60-65%, no IAS   HTN (hypertension)    Leukocytosis    Long term current use of anticoagulant    a.) apixaban   Long term current use of antithrombotics/antiplatelets    a.) clopidogrel   NICM (nonischemic cardiomyopathy) (HCC)    a.) TTE 01/30/2015: EF 30-35%; b.) R/LHC 03/25/2015: EF 25-30%, mRA 13, mPA 30, mPCWP 26, LVEDP 17, CO 3.66, CI 1.8; c.) TTE 06/13/2015: EF 40-45%; d.) TTE 10/06/2017: EF 60-65%; e.) TTE 09/29/2021: EF 45-50%; f.) TTE 02/24/2022: EF 60-65%   Obesity    On supplemental oxygen by nasal cannula    a.) 2L/[place] at night   Osteoporosis    Peripheral vascular disease (HCC)    Type 2 diabetes mellitus treated with insulin [facility])     Past Surgical History:  Procedure Laterality Date   ABDOMINAL HYSTERECTOMY     ANKLE FRACTURE SURGERY Left 02/09/2000   AV FISTULA PLACEMENT Left  06/18/2022   Procedure: ARTERIOVENOUS (AV) FISTULA CREATION (BRACHIAL AXILLA);  Surgeon: Renford Dills, MD;  Location: [facility] ORS;  Service: Vascular;  Laterality: Left;   BILATERAL SALPINGOOPHORECTOMY  02/08/1998   CARDIAC CATHETERIZATION N/A 03/25/2015   Procedure: Right/Left Heart Cath and Coronary Angiography;  Surgeon: Lyn Records, MD;  Location: [facility] INVASIVE CV LAB;  Service: Cardiovascular;  Laterality: N/A;   CESAREAN SECTION  02/09/1976   COLONOSCOPY WITH PROPOFOL N/A 09/22/2016   Procedure: COLONOSCOPY WITH PROPOFOL;  Surgeon: Scot Jun, MD;  Location: [facility] ENDOSCOPY;  Service: Endoscopy;  Laterality: N/A;   COLONOSCOPY WITH PROPOFOL N/A 02/15/2022   Procedure: COLONOSCOPY WITH PROPOFOL;  Surgeon: Toney Reil, MD;  Location: [facility] ENDOSCOPY;  Service: Gastroenterology;  Laterality: N/A;   EP IMPLANTABLE DEVICE N/A 08/05/2015   Procedure: Loop Recorder Insertion;  Surgeon: Duke Salvia, MD;  Location: [facility] INVASIVE CV LAB;  Service: Cardiovascular;  Laterality: N/A;   ESOPHAGOGASTRODUODENOSCOPY N/A 02/15/2022   Procedure: ESOPHAGOGASTRODUODENOSCOPY (EGD);  Surgeon: Toney Reil, MD;  Location: [facility] ENDOSCOPY;  Service: Gastroenterology;  Laterality: N/A;   ESOPHAGOGASTRODUODENOSCOPY (EGD) WITH PROPOFOL N/A 09/22/2016   Procedure: ESOPHAGOGASTRODUODENOSCOPY (EGD) WITH PROPOFOL;  Surgeon: Scot Jun, MD;  Location: [facility] ENDOSCOPY;  Service: Endoscopy;  Laterality: N/A;   ESOPHAGOGASTRODUODENOSCOPY (EGD) WITH PROPOFOL N/A 12/08/2016   Procedure: ESOPHAGOGASTRODUODENOSCOPY (EGD) WITH PROPOFOL;  Surgeon: Mechele Collin,  Wilber Bihari, MD;  Location: [facility] ENDOSCOPY;  Service: Endoscopy;  Laterality: N/A;   ESOPHAGOGASTRODUODENOSCOPY (EGD) WITH PROPOFOL N/A 10/12/2022   Procedure: ESOPHAGOGASTRODUODENOSCOPY (EGD) WITH PROPOFOL;  Surgeon: Toney Reil, MD;  Location: [facility] ENDOSCOPY;  Service: Gastroenterology;  Laterality: N/A;   FRACTURE SURGERY     GIVENS CAPSULE STUDY  N/A 04/19/2022   Procedure: GIVENS CAPSULE STUDY;  Surgeon: Toney Reil, MD;  Location: [facility] ENDOSCOPY;  Service: Gastroenterology;  Laterality: N/A;   GIVENS CAPSULE STUDY N/A 10/12/2022   Procedure: GIVENS CAPSULE STUDY;  Surgeon: Toney Reil, MD;  Location: [facility] ENDOSCOPY;  Service: Gastroenterology;  Laterality: N/A;   INSERTION HYBRID ANTERIOVENOUS GORTEX GRAFT  06/18/2022   Procedure: INSERTION HYBRID ANTERIOVENOUS GORTEX GRAFT;  Surgeon: Renford Dills, MD;  Location: [facility] ORS;  Service: Vascular;;   KNEE ARTHROSCOPY Left 02/09/2003   LOWER EXTREMITY ANGIOGRAPHY Left 08/12/2021   Procedure: Lower Extremity Angiography;  Surgeon: Renford Dills, MD;  Location: [facility] INVASIVE CV LAB;  Service: Cardiovascular;  Laterality: Left;   TEE WITHOUT CARDIOVERSION N/A 01/31/2015   Procedure: TRANSESOPHAGEAL ECHOCARDIOGRAM (TEE);  Surgeon: Lewayne Bunting, MD;  Location: [facility] ENDOSCOPY;  Service: Cardiovascular;  Laterality: N/A;   TEMPORARY DIALYSIS CATHETER N/A 08/14/2021   Procedure: TEMPORARY DIALYSIS CATHETER;  Surgeon: Renford Dills, MD;  Location: [facility] INVASIVE CV LAB;  Service: Cardiovascular;  Laterality: N/A;   TUBAL LIGATION  02/09/1976   ULNAR NERVE TRANSPOSITION  02/08/2006    Family History  Problem Relation Age of Onset   Heart disease Mother        died from CHF   Asthma Mother    Diabetes Mother    Heart disease Father    Aneurysm Father    COPD Brother    Diabetes Brother    Alcohol abuse Paternal Aunt    Cancer Maternal Grandmother        unknownorigin   Anemia Neg Hx    Arrhythmia Neg Hx    Clotting disorder Neg Hx    Fainting Neg Hx    Heart attack Neg Hx    Heart failure Neg Hx    Hyperlipidemia Neg Hx    Hypertension Neg Hx     Social History:  reports that she quit smoking about 6 months ago. Her smoking use included cigarettes. She started smoking about 48 years ago. She has a 24.2 pack-year smoking history. She has never used  smokeless tobacco. She reports that she does not drink alcohol and does not use drugs.  Allergies:  Allergies  Allergen Reactions   Codeine Nausea And Vomiting   Ivp Dye [Iodinated Contrast Media]     Patients states the IV Dye shuts her kidneys down    Medications: I have reviewed the patient's current medications.  Results for orders placed or performed during the hospital encounter of 12/27/22 (from the past 48 hour(s))  Comprehensive metabolic panel     Status: Abnormal   Collection Time: 12/27/22  4:12 AM  Result Value Ref Range   Sodium 134 (L) 135 - 145 mmol/L   Potassium 3.8 3.5 - 5.1 mmol/L   Chloride 98 98 - 111 mmol/L   CO2 23 22 - 32 mmol/L   Glucose, Bld 360 (H) 70 - 99 mg/dL    Comment: Glucose reference range applies only to samples taken after fasting for at least 8 hours.   BUN 23 8 - 23 mg/dL   Creatinine, Ser 1.61 (H) 0.44 - 1.00 mg/dL   Calcium 9.6  8.9 - 10.3 mg/dL   Total Protein 6.1 (L) 6.5 - 8.1 g/dL   Albumin 2.9 (L) 3.5 - 5.0 g/dL   AST 13 (L) 15 - 41 U/L   ALT 12 0 - 44 U/L   Alkaline Phosphatase 83 38 - 126 U/L   Total Bilirubin 0.5 <1.2 mg/dL   GFR, Estimated 11 (L) >60 mL/min    Comment: (NOTE) Calculated using the CKD-EPI Creatinine Equation (2021)    Anion gap 13 5 - 15    Comment: Performed at [facility], [address], [place], [place] [address]  CBC     Status: Abnormal   Collection Time: 12/27/22  4:12 AM  Result Value Ref Range   WBC 11.6 (H) 4.0 - 10.5 K/uL   RBC 3.88 3.87 - 5.11 MIL/uL   Hemoglobin 10.5 (L) 12.0 - 15.0 g/dL   HCT 19.1 (L) 47.8 - 29.5 %   MCV 91.8 80.0 - 100.0 fL   MCH 27.1 26.0 - 34.0 pg   MCHC 29.5 (L) 30.0 - 36.0 g/dL   RDW 62.1 (H) 30.8 - 65.7 %   Platelets 488 (H) 150 - 400 K/uL    Comment: REPEATED TO VERIFY   nRBC 0.0 0.0 - 0.2 %    Comment: Performed at [facility] Lab, [address], [place], [place] [address]  Ethanol     Status: None   Collection Time: 12/27/22  4:12 AM  Result Value  Ref Range   Alcohol, Ethyl (B) <10 <10 mg/dL    Comment: (NOTE) Lowest detectable limit for serum alcohol is 10 mg/dL.  For medical purposes only. Performed at [facility], [address], [place], [place] [address]   Protime-INR     Status: Abnormal   Collection Time: 12/27/22  4:12 AM  Result Value Ref Range   Prothrombin Time 19.3 (H) 11.4 - 15.2 seconds   INR 1.6 (H) 0.8 - 1.2    Comment: (NOTE) INR goal varies based on device and disease states. Performed at [facility] Lab, [address], [place], [place] [address]   Sample to Blood Bank     Status: None   Collection Time: 12/27/22  4:12 AM  Result Value Ref Range   Blood Bank Specimen SAMPLE AVAILABLE FOR TESTING    Sample Expiration      12/30/2022,2359 Performed at [facility], [address], [place], [place] [address]   Type and screen [facility]     Status: None (Preliminary result)   Collection Time: 12/27/22  4:15 AM  Result Value Ref Range   ABO/RH(D) PENDING    Antibody Screen PENDING    Sample Expiration      12/30/2022,2359 Performed at [facility], [address] [place], [place] [address]   I-Stat Chem 8, ED     Status: Abnormal   Collection Time: 12/27/22  4:21 AM  Result Value Ref Range   Sodium 137 135 - 145 mmol/L   Potassium 3.9 3.5 - 5.1 mmol/L   Chloride 99 98 - 111 mmol/L   BUN 27 (H) 8 - 23 mg/dL   Creatinine, Ser 6.44 (H) 0.44 - 1.00 mg/dL   Glucose, Bld 034 (H) 70 - 99 mg/dL    Comment: Glucose reference range applies only to samples taken after fasting for at least 8 hours.   Calcium, Ion 1.14 (L) 1.15 - 1.40 mmol/L   TCO2 25 22 - 32 mmol/L  Hemoglobin 11.9 (L) 12.0 - 15.0 g/dL   HCT 54.0 (L) 98.1 - 19.1 %    CT HEAD WO CONTRAST  Result Date: 12/27/2022 CLINICAL DATA:  Fall when walking dog with forehead laceration. EXAM: CT HEAD WITHOUT CONTRAST CT MAXILLOFACIAL WITHOUT CONTRAST CT CERVICAL SPINE WITHOUT CONTRAST TECHNIQUE:  Multidetector CT imaging of the head, cervical spine, and maxillofacial structures were performed using the standard protocol without intravenous contrast. Multiplanar CT image reconstructions of the cervical spine and maxillofacial structures were also generated. RADIATION DOSE REDUCTION: This exam was performed according to the departmental dose-optimization program which includes automated exposure control, adjustment of the mA and/or kV according to patient size and/or use of iterative reconstruction technique. COMPARISON:  09/19/2017 head CT FINDINGS: CT HEAD FINDINGS Brain: No evidence of acute infarction, hemorrhage, hydrocephalus, extra-axial collection or mass lesion/mass effect. Brain atrophy most pronounced in the biparietal region. Vascular: No hyperdense vessel or unexpected calcification. Skull: Deep laceration to the left forehead with undermining and flap of scalp folded laterally. Gas nearly reaches the bone. No underlying fracture or opaque foreign body. CT MAXILLOFACIAL FINDINGS Osseous: No acute fracture or mandibular dislocation. Orbits: No evidence of injury to the postseptal structures Sinuses: Left maxillary sinusitis with debris like appearance and partial mineralization. There is associated sclerotic wall thickening and bulging of the medial wall. Soft tissues: Forehead injury as above. CT CERVICAL SPINE FINDINGS Alignment: Normal. Skull base and vertebrae: No acute fracture. No primary bone lesion or focal pathologic process. Soft tissues and spinal canal: No prevertebral fluid or swelling. No visible canal hematoma. Disc levels:  Cervical spine degeneration which is mild for age. Upper chest: No evidence of injury IMPRESSION: 1. Extensive left anterior scalp laceration without underlying fracture. 2. No evidence of intracranial or cervical spine injury. 3. Chronic left maxillary sinusitis. Electronically Signed   By: Tiburcio Pea M.D.   On: 12/27/2022 04:46   CT MAXILLOFACIAL WO  CONTRAST  Result Date: 12/27/2022 CLINICAL DATA:  Fall when walking dog with forehead laceration. EXAM: CT HEAD WITHOUT CONTRAST CT MAXILLOFACIAL WITHOUT CONTRAST CT CERVICAL SPINE WITHOUT CONTRAST TECHNIQUE: Multidetector CT imaging of the head, cervical spine, and maxillofacial structures were performed using the standard protocol without intravenous contrast. Multiplanar CT image reconstructions of the cervical spine and maxillofacial structures were also generated. RADIATION DOSE REDUCTION: This exam was performed according to the departmental dose-optimization program which includes automated exposure control, adjustment of the mA and/or kV according to patient size and/or use of iterative reconstruction technique. COMPARISON:  09/19/2017 head CT FINDINGS: CT HEAD FINDINGS Brain: No evidence of acute infarction, hemorrhage, hydrocephalus, extra-axial collection or mass lesion/mass effect. Brain atrophy most pronounced in the biparietal region. Vascular: No hyperdense vessel or unexpected calcification. Skull: Deep laceration to the left forehead with undermining and flap of scalp folded laterally. Gas nearly reaches the bone. No underlying fracture or opaque foreign body. CT MAXILLOFACIAL FINDINGS Osseous: No acute fracture or mandibular dislocation. Orbits: No evidence of injury to the postseptal structures Sinuses: Left maxillary sinusitis with debris like appearance and partial mineralization. There is associated sclerotic wall thickening and bulging of the medial wall. Soft tissues: Forehead injury as above. CT CERVICAL SPINE FINDINGS Alignment: Normal. Skull base and vertebrae: No acute fracture. No primary bone lesion or focal pathologic process. Soft tissues and spinal canal: No prevertebral fluid or swelling. No visible canal hematoma. Disc levels:  Cervical spine degeneration which is mild for age. Upper chest: No evidence of injury IMPRESSION: 1. Extensive left anterior scalp laceration  without  underlying fracture. 2. No evidence of intracranial or cervical spine injury. 3. Chronic left maxillary sinusitis. Electronically Signed   By: Tiburcio Pea M.D.   On: 12/27/2022 04:46   CT CERVICAL SPINE WO CONTRAST  Result Date: 12/27/2022 CLINICAL DATA:  Fall when walking dog with forehead laceration. EXAM: CT HEAD WITHOUT CONTRAST CT MAXILLOFACIAL WITHOUT CONTRAST CT CERVICAL SPINE WITHOUT CONTRAST TECHNIQUE: Multidetector CT imaging of the head, cervical spine, and maxillofacial structures were performed using the standard protocol without intravenous contrast. Multiplanar CT image reconstructions of the cervical spine and maxillofacial structures were also generated. RADIATION DOSE REDUCTION: This exam was performed according to the departmental dose-optimization program which includes automated exposure control, adjustment of the mA and/or kV according to patient size and/or use of iterative reconstruction technique. COMPARISON:  09/19/2017 head CT FINDINGS: CT HEAD FINDINGS Brain: No evidence of acute infarction, hemorrhage, hydrocephalus, extra-axial collection or mass lesion/mass effect. Brain atrophy most pronounced in the biparietal region. Vascular: No hyperdense vessel or unexpected calcification. Skull: Deep laceration to the left forehead with undermining and flap of scalp folded laterally. Gas nearly reaches the bone. No underlying fracture or opaque foreign body. CT MAXILLOFACIAL FINDINGS Osseous: No acute fracture or mandibular dislocation. Orbits: No evidence of injury to the postseptal structures Sinuses: Left maxillary sinusitis with debris like appearance and partial mineralization. There is associated sclerotic wall thickening and bulging of the medial wall. Soft tissues: Forehead injury as above. CT CERVICAL SPINE FINDINGS Alignment: Normal. Skull base and vertebrae: No acute fracture. No primary bone lesion or focal pathologic process. Soft tissues and spinal canal: No prevertebral  fluid or swelling. No visible canal hematoma. Disc levels:  Cervical spine degeneration which is mild for age. Upper chest: No evidence of injury IMPRESSION: 1. Extensive left anterior scalp laceration without underlying fracture. 2. No evidence of intracranial or cervical spine injury. 3. Chronic left maxillary sinusitis. Electronically Signed   By: Tiburcio Pea M.D.   On: 12/27/2022 04:46    KGM:[id number] other than stated per HPI  Blood pressure 125/62, pulse 84, temperature 97.6 F (36.4 C), temperature source Oral, resp. rate (!) 26, SpO2 91%.  PHYSICAL EXAM:  Complex forehead laceration (images reviewed in [facility]), bandages intact.   Studies Reviewed:CT maxillofacial 12/27/22  IMPRESSION: 1. Extensive left anterior scalp laceration without underlying fracture. 2. No evidence of intracranial or cervical spine injury. 3. Chronic left maxillary sinusitis.     Electronically Signed   By: Tiburcio Pea M.D.   On: 12/27/2022 04:46  Assessment/Plan: Complex facial laceration. OCTOR for urgent washout and repair.  Informed consent obtained. RBA discussed  Medicine admission for dialysis and fall workup.   Medical Decision Making: 3 - moderate  #/Compl Problems  3                         Data Rev  3                 Management 3             (1-Straightforward, 2-Low, 3- Moderate, 4-High)   Electronically signed by:  Scarlette Ar, MD  Staff Physician Facial Plastic & Reconstructive Surgery Otolaryngology - Head and Neck Surgery [facility]   12/27/2022, 6:30 AM

## 2022-12-27 NOTE — ED Notes (Signed)
Per MD Wickline c-collar may be removed at this time.

## 2022-12-27 NOTE — ED Provider Notes (Signed)
[place] [facility] DEPARTMENT AT [facility] Provider Note   CSN: [id number] Arrival date & time: 12/27/22  0401     History  Chief Complaint  Patient presents with   Jocelyn Wells is a 67 y.o. female.  The history is provided by the patient and the EMS personnel.   Patient with extensive history including previous CVA, ESRD, COPD, CHF presents for accidental fall.  Patient reports she was trying to clean up after her dog defecated and she fell hitting her head.  No LOC.  She is on Eliquis She has large laceration to her forehead.  No other acute traumatic injuries from this fall.  However she has had multiple falls recently including sustaining a laceration to her right knee that was sutured at an urgent care.  Patient reports recent episode of vomiting about 2 days ago and she missed dialysis Past Medical History:  Diagnosis Date   Acute ischemic left MCA stroke (HCC) 01/29/2015   a.) MRI brain 01/29/2015: acute left MCA territory infarcts involving the posterior left frontal lobe and left insular cortex   Anemia of chronic renal failure    Anxiety    Arthritis    Complication of anesthesia    a.) PONV; b.) awareness under anesthesia   COPD (chronic obstructive pulmonary disease) (HCC)    Depression    Dyspnea    ESRD (end stage renal disease) (HCC)    Headache    HFrEF (heart failure with reduced ejection fraction) (HCC)    a.) TTE 01/30/2015: EF 30-35%, diff HK, LAE, G2DD; b.) TTE 06/13/2015: EF 40-45%, diff HK, G1DD; c.) TTE 10/06/2017: EF 60-65% MAC, no IAS; d.) TTE 09/29/2021: EF 45-50%;, LVH, LAE, AoV sclerosis, G1DD; e.) TTE 02/24/2022: EF 60-65%, no IAS   HTN (hypertension)    Leukocytosis    Long term current use of anticoagulant    a.) apixaban   Long term current use of antithrombotics/antiplatelets    a.) clopidogrel   NICM (nonischemic cardiomyopathy) (HCC)    a.) TTE 01/30/2015: EF 30-35%; b.) R/LHC 03/25/2015: EF 25-30%, mRA 13, mPA  30, mPCWP 26, LVEDP 17, CO 3.66, CI 1.8; c.) TTE 06/13/2015: EF 40-45%; d.) TTE 10/06/2017: EF 60-65%; e.) TTE 09/29/2021: EF 45-50%; f.) TTE 02/24/2022: EF 60-65%   Obesity    On supplemental oxygen by nasal cannula    a.) 2L/[place] at night   Osteoporosis    Peripheral vascular disease (HCC)    Type 2 diabetes mellitus treated with insulin (HCC)     Home Medications Prior to Admission medications   Medication Sig Start Date End Date Taking? Authorizing Provider  albuterol (VENTOLIN HFA) 108 (90 Base) MCG/ACT inhaler TAKE 2 PUFFS BY MOUTH EVERY 6 HOURS AS NEEDED FOR WHEEZE OR SHORTNESS OF BREATH Patient taking differently: Inhale 2 puffs into the lungs every 6 (six) hours as needed for wheezing or shortness of breath. 06/19/21   Jacky Kindle, FNP  apixaban (ELIQUIS) 5 MG TABS tablet Take 1 tablet (5 mg total) by mouth 2 (two) times daily. 11/29/22   Charlsie Quest, NP  atorvastatin (LIPITOR) 40 MG tablet Take 1 tablet (40 mg total) by mouth daily. 09/08/21   Jacky Kindle, FNP  buPROPion [facility] SR) 150 MG 12 hr tablet TAKE 1 TABLET BY MOUTH TWICE A DAY 12/08/22   Merita Norton T, FNP  calcitRIOL (ROCALTROL) 0.25 MCG capsule Take 0.25 mcg by mouth daily. 03/15/22   [provider]  Dulaglutide (TRULICITY)  4.5 MG/0.5ML SOPN Inject 4.5 mg into the skin every 7 (seven) days. Patient taking differently: Inject 4.5 mg into the skin every 7 (seven) days. Mondays 07/12/22     fluticasone (FLONASE) 50 MCG/ACT nasal spray Place 2 sprays into both nostrils daily. Patient taking differently: Place 2 sprays into both nostrils as needed. 02/13/21   Jacky Kindle, FNP  gabapentin (NEURONTIN) 300 MG capsule Take 300 mg by mouth daily.    [provider]  glucose blood test strip     [provider]  hydroxyurea (HYDREA) 500 MG capsule Take 1 capsule (500 mg total) by mouth every other day. May take with food to minimize GI side effects. 10/06/22   Creig Hines, MD  insulin aspart  (NOVOLOG) 100 UNIT/ML injection Insulin pump 05/26/20   [provider]  Insulin Disposable Pump (OMNIPOD DASH PODS, GEN 4,) MISC USE 1 POD EACH EVERY 72    HOURS 10/29/20   [provider]  Insulin Human (INSULIN PUMP) SOLN Per endocrinoloy 06/01/20   Elgergawy, Leana Roe, MD  loratadine (CLARITIN) 10 MG tablet Take 1 tablet by mouth daily.    [provider]  meclizine (ANTIVERT) 12.5 MG tablet Take 1 tablet (12.5 mg total) by mouth 3 (three) times daily as needed for dizziness. 11/23/21   Caro Laroche, DO  OXYGEN Place 2 L into the nose continuous.    [provider]  PARoxetine (PAXIL) 40 MG tablet Take 1 tablet (40 mg total) by mouth daily. 04/10/22   Neysa Hotter, MD  Spacer/Aero-Holding Chambers [facility] DIAMOND) MISC Use with inhaler to ensure medication is delivered throughout lungs 02/13/21   Jacky Kindle, FNP  Tiotropium Bromide-Olodaterol (STIOLTO RESPIMAT) 2.5-2.5 MCG/ACT AERS Inhale 2 puffs into the lungs daily. 11/29/22   Salena Saner, MD  torsemide (DEMADEX) 20 MG tablet TAKE 3 TABLETS BY MOUTH EVERY DAY Patient taking differently: TAKE 2 TABLETS BY MOUTH EVERY DAY 11/04/22   Delma Freeze, FNP  traMADol (ULTRAM) 50 MG tablet Take 1 tablet (50 mg total) by mouth every 12 (twelve) hours as needed for severe pain. 11/24/22 02/22/23  Edward Jolly, MD  Vitamin D, Ergocalciferol, (DRISDOL) 1.25 MG (50000 UNIT) CAPS capsule Take 50,000 Units by mouth every 7 (seven) days. Sunday Patient not taking: Reported on 12/14/2022 01/24/22   [provider]      Allergies    Codeine and Ivp dye [iodinated contrast media]    Review of Systems   Review of Systems  Skin:  Positive for wound.  Neurological:  Positive for headaches.    Physical Exam Updated Vital Signs BP 125/62   Pulse 84   Temp 97.6 F (36.4 C) (Oral)   Resp (!) 26   SpO2 91%  Physical Exam CONSTITUTIONAL: Elderly, ill-appearing HEAD: Large laceration noted on  left forehead that extends from the eyelid up to the forehead EYES: EOMI/PERRL Bruising noted to the left eyelid, but no hyphema, no conjunctival injection, no proptosis ENMT: Mucous membranes moist SPINE/BACK:entire spine nontender No bruising/crepitance/stepoffs noted to spine Patient maintained in spinal precautions/logroll utilized CV: S1/S2 noted, no murmurs/rubs/gallops noted LUNGS: Lungs are clear to auscultation bilaterally, no apparent distress ABDOMEN: soft, nontender, no bruising NEURO: Pt is awake/alert/appropriate, moves all extremitiesx4.  No facial droop.   EXTREMITIES: pulses normal/equal, full ROM Healing laceration to the right knee (previous injury) Scattered abrasions to her extremities All other extremities/joints palpated/ranged and nontender SKIN: warm, color normal PSYCH: no abnormalities of mood noted, alert and  oriented to situation  ED Results / Procedures / Treatments   Labs (all labs ordered are listed, but only abnormal results are displayed) Labs Reviewed  COMPREHENSIVE METABOLIC PANEL - Abnormal; Notable for the following components:      Result Value   Sodium 134 (*)    Glucose, Bld 360 (*)    Creatinine, Ser 4.21 (*)    Total Protein 6.1 (*)    Albumin 2.9 (*)    AST 13 (*)    GFR, Estimated 11 (*)    All other components within normal limits  CBC - Abnormal; Notable for the following components:   WBC 11.6 (*)    Hemoglobin 10.5 (*)    HCT 35.6 (*)    MCHC 29.5 (*)    RDW 18.2 (*)    Platelets 488 (*)    All other components within normal limits  PROTIME-INR - Abnormal; Notable for the following components:   Prothrombin Time 19.3 (*)    INR 1.6 (*)    All other components within normal limits  I-STAT CHEM 8, ED - Abnormal; Notable for the following components:   BUN 27 (*)    Creatinine, Ser 4.40 (*)    Glucose, Bld 362 (*)    Calcium, Ion 1.14 (*)    Hemoglobin 11.9 (*)    HCT 35.0 (*)    All other components within normal limits   ETHANOL  SAMPLE TO BLOOD BANK    EKG EKG Interpretation Date/Time:  Monday December 27 2022 05:25:13 EST Ventricular Rate:  87 PR Interval:  189 QRS Duration:  153 QT Interval:  402 QTC Calculation: 484 R Axis:   -78  Text Interpretation: Sinus rhythm Right bundle branch block LVH with IVCD and secondary repol abnrm Confirmed by Zadie Rhine [phone]) on 12/27/2022 5:28:37 AM  Radiology CT HEAD WO CONTRAST  Result Date: 12/27/2022 CLINICAL DATA:  Fall when walking dog with forehead laceration. EXAM: CT HEAD WITHOUT CONTRAST CT MAXILLOFACIAL WITHOUT CONTRAST CT CERVICAL SPINE WITHOUT CONTRAST TECHNIQUE: Multidetector CT imaging of the head, cervical spine, and maxillofacial structures were performed using the standard protocol without intravenous contrast. Multiplanar CT image reconstructions of the cervical spine and maxillofacial structures were also generated. RADIATION DOSE REDUCTION: This exam was performed according to the departmental dose-optimization program which includes automated exposure control, adjustment of the mA and/or kV according to patient size and/or use of iterative reconstruction technique. COMPARISON:  09/19/2017 head CT FINDINGS: CT HEAD FINDINGS Brain: No evidence of acute infarction, hemorrhage, hydrocephalus, extra-axial collection or mass lesion/mass effect. Brain atrophy most pronounced in the biparietal region. Vascular: No hyperdense vessel or unexpected calcification. Skull: Deep laceration to the left forehead with undermining and flap of scalp folded laterally. Gas nearly reaches the bone. No underlying fracture or opaque foreign body. CT MAXILLOFACIAL FINDINGS Osseous: No acute fracture or mandibular dislocation. Orbits: No evidence of injury to the postseptal structures Sinuses: Left maxillary sinusitis with debris like appearance and partial mineralization. There is associated sclerotic wall thickening and bulging of the medial wall. Soft tissues:  Forehead injury as above. CT CERVICAL SPINE FINDINGS Alignment: Normal. Skull base and vertebrae: No acute fracture. No primary bone lesion or focal pathologic process. Soft tissues and spinal canal: No prevertebral fluid or swelling. No visible canal hematoma. Disc levels:  Cervical spine degeneration which is mild for age. Upper chest: No evidence of injury IMPRESSION: 1. Extensive left anterior scalp laceration without underlying fracture. 2. No evidence of intracranial or cervical spine injury. 3.  Chronic left maxillary sinusitis. Electronically Signed   By: Tiburcio Pea M.D.   On: 12/27/2022 04:46   CT MAXILLOFACIAL WO CONTRAST  Result Date: 12/27/2022 CLINICAL DATA:  Fall when walking dog with forehead laceration. EXAM: CT HEAD WITHOUT CONTRAST CT MAXILLOFACIAL WITHOUT CONTRAST CT CERVICAL SPINE WITHOUT CONTRAST TECHNIQUE: Multidetector CT imaging of the head, cervical spine, and maxillofacial structures were performed using the standard protocol without intravenous contrast. Multiplanar CT image reconstructions of the cervical spine and maxillofacial structures were also generated. RADIATION DOSE REDUCTION: This exam was performed according to the departmental dose-optimization program which includes automated exposure control, adjustment of the mA and/or kV according to patient size and/or use of iterative reconstruction technique. COMPARISON:  09/19/2017 head CT FINDINGS: CT HEAD FINDINGS Brain: No evidence of acute infarction, hemorrhage, hydrocephalus, extra-axial collection or mass lesion/mass effect. Brain atrophy most pronounced in the biparietal region. Vascular: No hyperdense vessel or unexpected calcification. Skull: Deep laceration to the left forehead with undermining and flap of scalp folded laterally. Gas nearly reaches the bone. No underlying fracture or opaque foreign body. CT MAXILLOFACIAL FINDINGS Osseous: No acute fracture or mandibular dislocation. Orbits: No evidence of injury to  the postseptal structures Sinuses: Left maxillary sinusitis with debris like appearance and partial mineralization. There is associated sclerotic wall thickening and bulging of the medial wall. Soft tissues: Forehead injury as above. CT CERVICAL SPINE FINDINGS Alignment: Normal. Skull base and vertebrae: No acute fracture. No primary bone lesion or focal pathologic process. Soft tissues and spinal canal: No prevertebral fluid or swelling. No visible canal hematoma. Disc levels:  Cervical spine degeneration which is mild for age. Upper chest: No evidence of injury IMPRESSION: 1. Extensive left anterior scalp laceration without underlying fracture. 2. No evidence of intracranial or cervical spine injury. 3. Chronic left maxillary sinusitis. Electronically Signed   By: Tiburcio Pea M.D.   On: 12/27/2022 04:46   CT CERVICAL SPINE WO CONTRAST  Result Date: 12/27/2022 CLINICAL DATA:  Fall when walking dog with forehead laceration. EXAM: CT HEAD WITHOUT CONTRAST CT MAXILLOFACIAL WITHOUT CONTRAST CT CERVICAL SPINE WITHOUT CONTRAST TECHNIQUE: Multidetector CT imaging of the head, cervical spine, and maxillofacial structures were performed using the standard protocol without intravenous contrast. Multiplanar CT image reconstructions of the cervical spine and maxillofacial structures were also generated. RADIATION DOSE REDUCTION: This exam was performed according to the departmental dose-optimization program which includes automated exposure control, adjustment of the mA and/or kV according to patient size and/or use of iterative reconstruction technique. COMPARISON:  09/19/2017 head CT FINDINGS: CT HEAD FINDINGS Brain: No evidence of acute infarction, hemorrhage, hydrocephalus, extra-axial collection or mass lesion/mass effect. Brain atrophy most pronounced in the biparietal region. Vascular: No hyperdense vessel or unexpected calcification. Skull: Deep laceration to the left forehead with undermining and flap of  scalp folded laterally. Gas nearly reaches the bone. No underlying fracture or opaque foreign body. CT MAXILLOFACIAL FINDINGS Osseous: No acute fracture or mandibular dislocation. Orbits: No evidence of injury to the postseptal structures Sinuses: Left maxillary sinusitis with debris like appearance and partial mineralization. There is associated sclerotic wall thickening and bulging of the medial wall. Soft tissues: Forehead injury as above. CT CERVICAL SPINE FINDINGS Alignment: Normal. Skull base and vertebrae: No acute fracture. No primary bone lesion or focal pathologic process. Soft tissues and spinal canal: No prevertebral fluid or swelling. No visible canal hematoma. Disc levels:  Cervical spine degeneration which is mild for age. Upper chest: No evidence of injury IMPRESSION: 1. Extensive left anterior  scalp laceration without underlying fracture. 2. No evidence of intracranial or cervical spine injury. 3. Chronic left maxillary sinusitis. Electronically Signed   By: Tiburcio Pea M.D.   On: 12/27/2022 04:46    Procedures .Critical Care  Performed by: Zadie Rhine, MD Authorized by: Zadie Rhine, MD   Critical care provider statement:    Critical care time (minutes):  50   Critical care start time:  12/27/2022 4:30 AM   Critical care end time:  12/27/2022 5:20 AM   Critical care time was exclusive of:  Separately billable procedures and treating other patients   Critical care was necessary to treat or prevent imminent or life-threatening deterioration of the following conditions:  Trauma, CNS failure or compromise and shock   Critical care was time spent personally by me on the following activities:  Examination of patient, discussions with consultants, re-evaluation of patient's condition, pulse oximetry, ordering and review of radiographic studies, ordering and review of laboratory studies, ordering and performing treatments and interventions, development of treatment plan with  patient or surrogate and obtaining history from patient or surrogate   I assumed direction of critical care for this patient from another provider in my specialty: no     Care discussed with: admitting provider       Medications Ordered in ED Medications  fentaNYL (SUBLIMAZE) injection 12.5-50 mcg (has no administration in time range)  ondansetron (ZOFRAN) injection 4 mg (has no administration in time range)    ED Course/ Medical Decision Making/ A&P Clinical Course as of 12/27/22 0543  Mon Dec 27, 2022  0424 Patient seen on arrival as a level 2 trauma.  Patient has deep large laceration of the left forehead in the setting of Eliquis use.  Patient with extensive medical history including end-stage renal disease and previous CVA.  She has had multiple falls recently.  She has no signs of any chest or abdominal trauma. CT head and C-spine to been ordered in addition to CT maxillofacial [DW]  0510 Known chronic renal failure, last dialysis was on November 14. Patient dialysis access left arm with thrill noted All CT imaging negative.  C-collar removed [DW]  [phone] Discussed the case with Dr. Gabriel Rainwater with ENT We have discussed her findings and imaging.  Patient with large bleeding wound to her forehead that is quite deep and extends into the eyelid. We both agree the patient would best be served having this manage in an operative setting Plan to be admitted to the hospitalist for medical management, likely needs dialysis and ENT can repair the wound [DW]  0543 D/w dr opyd for admission Will need dialysis after OR (no emergent indication for HD at this time) Aside from head trauma, no other signs of acute traumatic injury  [DW]    Clinical Course User Index [DW] Zadie Rhine, MD           Glasgow Coma Scale Score: 15      NEXUS Criteria Score: 1                Medical Decision Making Amount and/or Complexity of Data Reviewed Labs: ordered. Radiology: ordered. ECG/medicine tests:  ordered.  Risk Decision regarding hospitalization.   This patient presents to the ED for concern of head injury, this involves an extensive number of treatment options, and is a complaint that carries with it a high risk of complications and morbidity.  The differential diagnosis includes but is not limited to skull fracture, subdural hematoma, subarachnoid hemorrhage, concussion  Comorbidities  that complicate the patient evaluation: Patient's presentation is complicated by their history of ESRD, previous CVA.  She is on Eliquis  Social Determinants of Health: Patient's poor mobility     increases the complexity of managing their presentation  Additional history obtained: Additional history obtained from EMS  Records reviewed  outpatient records reviewed  Lab Tests: I Ordered, and personally interpreted labs.  The pertinent results include: Renal failure, hyperglycemia  Imaging Studies ordered: I ordered imaging studies including CT scan head, face, C-spine   I independently visualized and interpreted imaging which showed no acute traumatic injuries I agree with the radiologist interpretation   Critical Interventions:   admission for laceration repair and operative management  Consultations Obtained: I requested consultation with the consultant otolaryngology , and discussed  findings as well as pertinent plan - they recommend: Will take to the OR for repair  Reevaluation: After the interventions noted above, I reevaluated the patient and found that they have :stayed the same  Complexity of problems addressed: Patient's presentation is most consistent with  acute presentation with potential threat to life or bodily function  Disposition: After consideration of the diagnostic results and the patient's response to treatment,  I feel that the patent would benefit from admission   .           Final Clinical Impression(s) / ED Diagnoses Final diagnoses:  Fall, initial  encounter  ESRD (end stage renal disease) (HCC)  Laceration of scalp, initial encounter    Rx / DC Orders ED Discharge Orders     None         Zadie Rhine, MD 12/27/22 [phone]

## 2022-12-27 NOTE — Inpatient Diabetes Management (Addendum)
Inpatient Diabetes Program Recommendations  AACE/ADA: New Consensus Statement on Inpatient Glycemic Control (2015)  Target Ranges:  Prepandial:   less than 140 mg/dL      Peak postprandial:   less than 180 mg/dL (1-2 hours)      Critically ill patients:  140 - 180 mg/dL   Lab Results  Component Value Date   GLUCAP 293 (H) 12/27/2022   HGBA1C 7.8 04/22/2022    Review of Glycemic Control  Latest Reference Range & Units 12/27/22 08:04 12/27/22 10:25  Glucose-Capillary 70 - 99 mg/dL 144 (H) 315 (H)  (H): Data is abnormally high  Diabetes history: DM2  Outpatient Diabetes medications:  Omnipod Dash with Novolog and Freestyle Libre 2 CGM Basal-12a-1.4/hr, 3a-0.8/hr, 12p-1.4/hr (total 30) ICR-1 unit for every 5 carbs ISF-1 unit drops her 20 mg/dL Target glucose 400 mg/dL Trulicity 4.5 mg weekly  Current orders for Inpatient glycemic control:  Semglee 25 units every day  Novolog 0-6 units TID Novolog 10 units TID Decadron in OR  Met with patient and son Arlys John at bedside.  Reviewed patient's current A1c of 11.9%. Explained what a A1c is and what it measures. Also reviewed goal A1c with patient, importance of good glucose control @ home, and blood sugar goals. Ordered the Living Well with Diabetes   She wears an Omnipod insulin pump with the above settings.  Her Endocrinologist is Dr. Tedd Sias in [place].  She has been without her pump for a week because [facility] would not send her any due to needing a refill request/PA.  She states she spoke with [facility] earlier in the week and they are sending the pump supplies.    While she has been out of her pump she has only been giving herself Lantus 15-25 units daily.  Her son says her glucose has been very high.    Called [facility] and confirmed her Omnipods were shipped today 11/18.  She should have them in the next few days. She will need basal/bolus until her pump arrive.  Notified her son Arlys John.    Will continue to follow while  inpatient.  Thank you, Dulce Sellar, MSN, CDCES Diabetes Coordinator Inpatient Diabetes Program [phone] (team pager from 8a-5p)

## 2022-12-27 NOTE — Progress Notes (Signed)
   12/27/22 0427  Spiritual Encounters  Type of Visit Initial  Care provided to: Pt and family  Conversation partners present during encounter Nurse  Referral source Trauma page  Reason for visit Trauma  OnCall Visit Yes   Chaplain responding to Trauma 2 page, Fall on Thinners.  Arriving at room chaplain met Pt's husband and daughter. Husband reports that Pt "falls all the time" because "she refuses to use her walker at home."  Pt still has stitches in her knee from a fall on Friday. Chaplain then conducted family to bedside and no further chaplain services were desired at that time.  Chaplain services remain available by Spiritual Consult or for emergent cases, paging [phone]  Chaplain Raelene Bott, MDiv [email or web address] [email or web address] [phone]

## 2022-12-27 NOTE — TOC CM/SW Note (Signed)
Transition of Care [facility]) - Inpatient Brief Assessment   Patient Details  Name: Jocelyn Wells MRN: [id number] Date of Birth: 27-Dec-1955  [facility]) CM/SW Contact:    Tom-Johnson, Hershal Coria, RN Phone Number: 12/27/2022, 4:20 PM   Clinical Narrative:  Patient presented to ED  after a fall at home while walking her dog and sustained a Forehead Laceration and skin tears to her Arms. Pt has Hx of Falls, CVA, ESRD on TTS Outpatient HD. On Eliquis. ENT was consulted, underwent Facial Laceration repair today 12/27/22. Nephrology following for inpatient HD.   From home with husband, has four supportive children. Does not drive, husband drives to and from appointments. Has a walker, w/c, shower seat and grab bars at home.  PCP is Jacky Kindle, FNP and uses [facility] on [address].                Transition of Care Asessment: Insurance and Status: Insurance coverage has been reviewed Patient has primary care physician: Yes Home environment has been reviewed: Yes Prior level of function:: Modified Independent Prior/Current Home Services: No current home services Social Determinants of Health Reivew: SDOH reviewed no interventions necessary Readmission risk has been reviewed: Yes Transition of care needs: transition of care needs identified, TOC will continue to follow

## 2022-12-27 NOTE — Progress Notes (Signed)
No charge note  Patient seen and examined this morning, admitted overnight, H&P reviewed and agree with assessment and plan.  In addition, I also spoke with Dr. Antionette Char   67 year old female with IDDM, COPD with nocturnal oxygen, thrombocytosis, ESRD on HD who comes into the hospital after a fall at home and a scalp laceration.  She has recurrent falls, denies syncope but has been consistently not using her walker which is one of the reasons for why she falls.  Recently missed dialysis due to nausea and vomiting, and husband tells me that she sometimes gets weaker if she misses dialysis and that may have played a role.  She has had a large left anterior scalp laceration, ENT was consulted and she was taken to the OR for repair  Principal problem Scalp laceration-Dr. Merrilee Jansky with ENT consulted, status post lac repair in the OR 11/18  Active problems COPD, chronic hypoxia-no wheezing, continue home medications  Essential thrombocytosis-hold Hydrea  ESRD-nephrology consulted  History of DVT-hold Eliquis for now.  Will discuss with ENT about when can be resumed  Prior CVA-continue Lipitor, hold Eliquis  Anemia due to chronic kidney disease-monitor hemoglobin  IDDM -poorly controlled, with hyperglycemia, A1c 11.9.  She is not using insulin pump at home, continue glargine as well as sliding scale.  Increase mealtime, increase glargine  CBG (last 3)  Recent Labs    12/27/22 0804 12/27/22 1025  GLUCAP 372* 293*    Scheduled Meds:  arformoterol  15 mcg Nebulization BID   And   umeclidinium bromide  1 puff Inhalation Daily   atorvastatin  40 mg Oral Daily   buPROPion  150 mg Oral BID   calcitRIOL  0.25 mcg Oral Daily   gabapentin  300 mg Oral Daily   insulin aspart  0-6 Units Subcutaneous TID WC   insulin aspart  10 Units Subcutaneous TID WC   insulin glargine-yfgn  15 Units Subcutaneous Daily   living well with diabetes book   Does not apply Once   PARoxetine  40 mg Oral Daily    sodium chloride flush  3 mL Intravenous Q12H   Continuous Infusions: PRN Meds:.acetaminophen **OR** acetaminophen, albuterol, fentaNYL (SUBLIMAZE) injection, ondansetron (ZOFRAN) IV, oxyCODONE  Frona Yost M. Elvera Lennox, MD, PhD [facility]  Between 7 am - 7 pm you can contact me via Amion (for emergencies) or Securechat (non urgent matters).  I am not available 7 pm - 7 am, please contact night coverage MD/APP via Amion

## 2022-12-27 NOTE — ED Triage Notes (Signed)
Pt arrived via EMS from home. Pt was walking dog and bent over causing her to fall forward hitting her head on the table. Lac present to forehead with multiple skin tears to arms. Denies LOC. Takes eliquis. Pt reports 3 falls this week. History of stroke.

## 2022-12-27 NOTE — Op Note (Addendum)
FACIAL PLASTIC OPERATIVE NOTE  Jocelyn Wells Date/Time of Admission: 12/27/2022  4:01 AM  CSN: [id number];MRN:[id number] Attending Provider: Leatha Gilding, MD Room/Bed: MCPO/NONE DOB: 03/22/1955 Age: 67 y.o.   Pre-Op Diagnosis: Forehead laceration  Post-Op Diagnosis: Forehead laceration  Procedure: Complex facial laceration degloving repair 10cm  Repair eyelid laceration 3cm  Anesthesia: General  Surgeon(s): Mervin Kung, MD  Staff: Circulator: Towanda Octave, RN Relief Circulator: Ivin Booty, RN Relief Scrub: Francesco Sor, CST Scrub Person: Panchit, Faye Ramsay: Hardin Negus, RN  Implants: * No implants in log *  Specimens: * No specimens in log *  Complications: none  EBL: 30 ML  IVF: Per anesthesia ML  Condition: stable  Operative Findings:  Complex degloving forehead, brow, eyelid laceration total 13cm  Description of Operation: Patient was identified in the preoperative area and consent confirmed in the chart.  She was brought to the operating by the anesthetist and a preoperative huddle was performed confirming patient identity and procedure to be performed.  Once all were in agreement we proceeded with surgery.  General anesthesia was induced patient was intubated with oral endotracheal tube.  Tube was secured.  Bed was turned 90 degrees with anesthetist.  The patient's hair was copiously irrigated with shampoo and warm saline and cleansed with a brush.  Patient's forehead was then infiltrated with local anesthetic a total of 8 cc of 1% lidocaine 1 100,000 epinephrine.  Patient was then prepped and draped in standard fashion for procedure of this kind.  A final preoperative pause was performed we proceeded with surgery.  Next double-pronged skin hooks were used to raise the forehead, & brow laceration flap,approximately total of 10 cm.  The wound was full of blood clots and active bleeding.  Blood clots were  debrided and irrigated out and bleeding was controlled bipolar cautery.  There was multilayer violation of the frontalis muscle down to the periosteum with violation of the supraorbital and supratrochlear nerves.  The forehead, eyebrow and eyelid were reapproximated with buried interrupted 4-0 Vicryl sutures for the frontalis, corrugator and orbicularis oculi muscle layers.  Next the forehead was closed with interrupted 5-0 nylon sutures.  The brow was closed with interrupted 5-0 Vicryl rapide sutures.  Next I addressed the 3cm eyelid laceration, simple closure; The eyelid was closed with running 5-0 fast gut suture.  The wounds were cleansed, dressed with bacitracin, Telfa and were wrapped with a pressure dressing consisting of gauze fluffs, Kerlix and Coban.  The patient was then turned back to the anesthetist to extubate her and brought her to the recovery room in stable condition.  All counts were correct and final.  Plan: - Abx x 5 days (keflex) - Pressure dressing x 1 week - Admission per medicine for dialysis/medical issues  Mervin Kung, MD [facility]  12/27/2022

## 2022-12-27 NOTE — Care Management Obs Status (Signed)
MEDICARE OBSERVATION STATUS NOTIFICATION   Patient Details  Name: ROCKET STEFANEK MRN: [id number] Date of Birth: [birth year]   Medicare Observation Status Notification Given:  Yes    Tom-Johnson, Hershal Coria, RN 12/27/2022, 4:50 PM

## 2022-12-27 NOTE — ED Notes (Signed)
C-collar placed by this RN & jennifer RN

## 2022-12-27 NOTE — Telephone Encounter (Signed)
I was sent a request for cardiac/medical clearance by Dr. Stephannie Li on Ms. Wrenly and service.  My response after review of the chart was as follows her last peripheral intervention was August 12, 2021 and her follow-up with a recent ABI demonstrated things were stable.  Per vascular it would be okay to be off her anticoagulants.  However, I am not the prescribing physician for her Eliquis that appears to be cardiology and informed Dr. Allyne Gee that he must check with them regarding cessation of Eliquis therapy as they are the prescriber.

## 2022-12-27 NOTE — Anesthesia Postprocedure Evaluation (Signed)
Anesthesia Post Note  Patient: Jocelyn Wells  Procedure(s) Performed: FACIAL LACERATION REPAIR     Patient location during evaluation: PACU Anesthesia Type: General Level of consciousness: sedated and patient cooperative Pain management: pain level controlled Vital Signs Assessment: post-procedure vital signs reviewed and stable Respiratory status: spontaneous breathing Cardiovascular status: stable Anesthetic complications: no   No notable events documented.  Last Vitals:  Vitals:   12/27/22 0845 12/27/22 0936  BP: (!) 170/61 (!) 139/100  Pulse: 90 85  Resp: (!) 21 16  Temp: 37.3 C 36.4 C  SpO2: 95% 95%    Last Pain:  Vitals:   12/27/22 1519  TempSrc:   PainSc: 2                  Lewie Loron

## 2022-12-27 NOTE — ED Notes (Signed)
ED TO INPATIENT HANDOFF REPORT  ED Nurse Name and Phone #: Orpah Clinton Name/Age/Gender Jocelyn Wells 67 y.o. female Room/Bed: 028C/028C  Code Status   Code Status: Prior  Home/SNF/Other Home Patient oriented to: self, place, time, and situation Is this baseline? Yes   Triage Complete: Triage complete  Chief Complaint Scalp laceration, initial encounter [S01.01XA]  Triage Note Pt arrived via EMS from home. Pt was walking dog and bent over causing her to fall forward hitting her head on the table. Lac present to forehead with multiple skin tears to arms. Denies LOC. Takes eliquis. Pt reports 3 falls this week. History of stroke.    Allergies Allergies  Allergen Reactions   Codeine Nausea And Vomiting   Ivp Dye [Iodinated Contrast Media]     Patients states the IV Dye shuts her kidneys down    Level of Care/Admitting Diagnosis ED Disposition     ED Disposition  Admit   Condition  --   Comment  Hospital Area: [facility] [[id number]]  Level of Care: Telemetry Medical [104]  May place patient in observation at [facility] or [facility] if equivalent level of care is available:: No  Covid Evaluation: Asymptomatic - no recent exposure (last 10 days) testing not required  Diagnosis: Scalp laceration, initial encounter [161096]  Admitting Physician: Briscoe Deutscher [[id number]]  Attending Physician: Briscoe Deutscher [[id number]]          B Medical/Surgery History Past Medical History:  Diagnosis Date   Acute ischemic left MCA stroke (HCC) 01/29/2015   a.) MRI brain 01/29/2015: acute left MCA territory infarcts involving the posterior left frontal lobe and left insular cortex   Anemia of chronic renal failure    Anxiety    Arthritis    Complication of anesthesia    a.) PONV; b.) awareness under anesthesia   COPD (chronic obstructive pulmonary disease) (HCC)    Depression    Dyspnea    ESRD (end stage renal disease) (HCC)    Headache    HFrEF (heart  failure with reduced ejection fraction) (HCC)    a.) TTE 01/30/2015: EF 30-35%, diff HK, LAE, G2DD; b.) TTE 06/13/2015: EF 40-45%, diff HK, G1DD; c.) TTE 10/06/2017: EF 60-65% MAC, no IAS; d.) TTE 09/29/2021: EF 45-50%;, LVH, LAE, AoV sclerosis, G1DD; e.) TTE 02/24/2022: EF 60-65%, no IAS   HTN (hypertension)    Leukocytosis    Long term current use of anticoagulant    a.) apixaban   Long term current use of antithrombotics/antiplatelets    a.) clopidogrel   NICM (nonischemic cardiomyopathy) (HCC)    a.) TTE 01/30/2015: EF 30-35%; b.) R/LHC 03/25/2015: EF 25-30%, mRA 13, mPA 30, mPCWP 26, LVEDP 17, CO 3.66, CI 1.8; c.) TTE 06/13/2015: EF 40-45%; d.) TTE 10/06/2017: EF 60-65%; e.) TTE 09/29/2021: EF 45-50%; f.) TTE 02/24/2022: EF 60-65%   Obesity    On supplemental oxygen by nasal cannula    a.) 2L/[place] at night   Osteoporosis    Peripheral vascular disease (HCC)    Type 2 diabetes mellitus treated with insulin [facility])    Past Surgical History:  Procedure Laterality Date   ABDOMINAL HYSTERECTOMY     ANKLE FRACTURE SURGERY Left 02/09/2000   AV FISTULA PLACEMENT Left 06/18/2022   Procedure: ARTERIOVENOUS (AV) FISTULA CREATION (BRACHIAL AXILLA);  Surgeon: Renford Dills, MD;  Location: [facility] ORS;  Service: Vascular;  Laterality: Left;   BILATERAL SALPINGOOPHORECTOMY  02/08/1998   CARDIAC CATHETERIZATION N/A 03/25/2015  Procedure: Right/Left Heart Cath and Coronary Angiography;  Surgeon: Lyn Records, MD;  Location: [facility] INVASIVE CV LAB;  Service: Cardiovascular;  Laterality: N/A;   CESAREAN SECTION  02/09/1976   COLONOSCOPY WITH PROPOFOL N/A 09/22/2016   Procedure: COLONOSCOPY WITH PROPOFOL;  Surgeon: Scot Jun, MD;  Location: [facility] ENDOSCOPY;  Service: Endoscopy;  Laterality: N/A;   COLONOSCOPY WITH PROPOFOL N/A 02/15/2022   Procedure: COLONOSCOPY WITH PROPOFOL;  Surgeon: Toney Reil, MD;  Location: [facility] ENDOSCOPY;  Service: Gastroenterology;  Laterality: N/A;   EP IMPLANTABLE  DEVICE N/A 08/05/2015   Procedure: Loop Recorder Insertion;  Surgeon: Duke Salvia, MD;  Location: [facility] INVASIVE CV LAB;  Service: Cardiovascular;  Laterality: N/A;   ESOPHAGOGASTRODUODENOSCOPY N/A 02/15/2022   Procedure: ESOPHAGOGASTRODUODENOSCOPY (EGD);  Surgeon: Toney Reil, MD;  Location: [facility] ENDOSCOPY;  Service: Gastroenterology;  Laterality: N/A;   ESOPHAGOGASTRODUODENOSCOPY (EGD) WITH PROPOFOL N/A 09/22/2016   Procedure: ESOPHAGOGASTRODUODENOSCOPY (EGD) WITH PROPOFOL;  Surgeon: Scot Jun, MD;  Location: [facility] ENDOSCOPY;  Service: Endoscopy;  Laterality: N/A;   ESOPHAGOGASTRODUODENOSCOPY (EGD) WITH PROPOFOL N/A 12/08/2016   Procedure: ESOPHAGOGASTRODUODENOSCOPY (EGD) WITH PROPOFOL;  Surgeon: Scot Jun, MD;  Location: [facility] ENDOSCOPY;  Service: Endoscopy;  Laterality: N/A;   ESOPHAGOGASTRODUODENOSCOPY (EGD) WITH PROPOFOL N/A 10/12/2022   Procedure: ESOPHAGOGASTRODUODENOSCOPY (EGD) WITH PROPOFOL;  Surgeon: Toney Reil, MD;  Location: [facility] ENDOSCOPY;  Service: Gastroenterology;  Laterality: N/A;   FRACTURE SURGERY     GIVENS CAPSULE STUDY N/A 04/19/2022   Procedure: GIVENS CAPSULE STUDY;  Surgeon: Toney Reil, MD;  Location: [facility] ENDOSCOPY;  Service: Gastroenterology;  Laterality: N/A;   GIVENS CAPSULE STUDY N/A 10/12/2022   Procedure: GIVENS CAPSULE STUDY;  Surgeon: Toney Reil, MD;  Location: [facility] ENDOSCOPY;  Service: Gastroenterology;  Laterality: N/A;   INSERTION HYBRID ANTERIOVENOUS GORTEX GRAFT  06/18/2022   Procedure: INSERTION HYBRID ANTERIOVENOUS GORTEX GRAFT;  Surgeon: Renford Dills, MD;  Location: [facility] ORS;  Service: Vascular;;   KNEE ARTHROSCOPY Left 02/09/2003   LOWER EXTREMITY ANGIOGRAPHY Left 08/12/2021   Procedure: Lower Extremity Angiography;  Surgeon: Renford Dills, MD;  Location: [facility] INVASIVE CV LAB;  Service: Cardiovascular;  Laterality: Left;   TEE WITHOUT CARDIOVERSION N/A 01/31/2015   Procedure: TRANSESOPHAGEAL  ECHOCARDIOGRAM (TEE);  Surgeon: Lewayne Bunting, MD;  Location: [facility] ENDOSCOPY;  Service: Cardiovascular;  Laterality: N/A;   TEMPORARY DIALYSIS CATHETER N/A 08/14/2021   Procedure: TEMPORARY DIALYSIS CATHETER;  Surgeon: Renford Dills, MD;  Location: [facility] INVASIVE CV LAB;  Service: Cardiovascular;  Laterality: N/A;   TUBAL LIGATION  02/09/1976   ULNAR NERVE TRANSPOSITION  02/08/2006     A IV Location/Drains/Wounds Patient Lines/Drains/Airways Status     Active Line/Drains/Airways     Name Placement date Placement time Site Days   Peripheral IV 12/27/22 18 G Right Forearm 12/27/22  0414  Forearm  less than 1   Fistula / Graft Left Upper arm Arteriovenous vein graft 06/18/22  0915  Upper arm  192   Wound / Incision (Open or Dehisced) 01/02/22 Non-pressure wound Pretibial Right 01/02/22  0230  Pretibial  359   Wound / Incision (Open or Dehisced) 01/02/22 Non-pressure wound Pretibial Left;Proximal 01/02/22  0230  Pretibial  359            Intake/Output Last 24 hours No intake or output data in the 24 hours ending 12/27/22 0543  Labs/Imaging Results for orders placed or performed during the hospital encounter of 12/27/22 (from the past 48 hour(s))  Comprehensive metabolic panel  Status: Abnormal   Collection Time: 12/27/22  4:12 AM  Result Value Ref Range   Sodium 134 (L) 135 - 145 mmol/L   Potassium 3.8 3.5 - 5.1 mmol/L   Chloride 98 98 - 111 mmol/L   CO2 23 22 - 32 mmol/L   Glucose, Bld 360 (H) 70 - 99 mg/dL    Comment: Glucose reference range applies only to samples taken after fasting for at least 8 hours.   BUN 23 8 - 23 mg/dL   Creatinine, Ser 9.60 (H) 0.44 - 1.00 mg/dL   Calcium 9.6 8.9 - 45.4 mg/dL   Total Protein 6.1 (L) 6.5 - 8.1 g/dL   Albumin 2.9 (L) 3.5 - 5.0 g/dL   AST 13 (L) 15 - 41 U/L   ALT 12 0 - 44 U/L   Alkaline Phosphatase 83 38 - 126 U/L   Total Bilirubin 0.5 <1.2 mg/dL   GFR, Estimated 11 (L) >60 mL/min    Comment: (NOTE) Calculated using the  CKD-EPI Creatinine Equation (2021)    Anion gap 13 5 - 15    Comment: Performed at [facility], [address], [place], [place] [address]  CBC     Status: Abnormal   Collection Time: 12/27/22  4:12 AM  Result Value Ref Range   WBC 11.6 (H) 4.0 - 10.5 K/uL   RBC 3.88 3.87 - 5.11 MIL/uL   Hemoglobin 10.5 (L) 12.0 - 15.0 g/dL   HCT 91.4 (L) 78.2 - 95.6 %   MCV 91.8 80.0 - 100.0 fL   MCH 27.1 26.0 - 34.0 pg   MCHC 29.5 (L) 30.0 - 36.0 g/dL   RDW 21.3 (H) 08.6 - 57.8 %   Platelets 488 (H) 150 - 400 K/uL    Comment: REPEATED TO VERIFY   nRBC 0.0 0.0 - 0.2 %    Comment: Performed at [facility], [address], [place], [place] [address]  Ethanol     Status: None   Collection Time: 12/27/22  4:12 AM  Result Value Ref Range   Alcohol, Ethyl (B) <10 <10 mg/dL    Comment: (NOTE) Lowest detectable limit for serum alcohol is 10 mg/dL.  For medical purposes only. Performed at [facility], [address], [place], [place] [address]   Protime-INR     Status: Abnormal   Collection Time: 12/27/22  4:12 AM  Result Value Ref Range   Prothrombin Time 19.3 (H) 11.4 - 15.2 seconds   INR 1.6 (H) 0.8 - 1.2    Comment: (NOTE) INR goal varies based on device and disease states. Performed at [facility], [address] [place], [place] [address]   Sample to Blood Bank     Status: None   Collection Time: 12/27/22  4:12 AM  Result Value Ref Range   Blood Bank Specimen SAMPLE AVAILABLE FOR TESTING    Sample Expiration      12/30/2022,2359 Performed at [facility], [address] [place], [place] [address]   I-Stat Chem 8, ED     Status: Abnormal   Collection Time: 12/27/22  4:21 AM  Result Value Ref Range   Sodium 137 135 - 145 mmol/L   Potassium 3.9 3.5 - 5.1 mmol/L   Chloride 99 98 - 111 mmol/L   BUN 27 (H) 8 - 23 mg/dL   Creatinine, Ser 2.53 (H) 0.44 - 1.00 mg/dL   Glucose, Bld 664 (H) 70 - 99 mg/dL  Comment: Glucose reference range applies only  to samples taken after fasting for at least 8 hours.   Calcium, Ion 1.14 (L) 1.15 - 1.40 mmol/L   TCO2 25 22 - 32 mmol/L   Hemoglobin 11.9 (L) 12.0 - 15.0 g/dL   HCT 14.7 (L) 82.9 - 56.2 %   CT HEAD WO CONTRAST  Result Date: 12/27/2022 CLINICAL DATA:  Fall when walking dog with forehead laceration. EXAM: CT HEAD WITHOUT CONTRAST CT MAXILLOFACIAL WITHOUT CONTRAST CT CERVICAL SPINE WITHOUT CONTRAST TECHNIQUE: Multidetector CT imaging of the head, cervical spine, and maxillofacial structures were performed using the standard protocol without intravenous contrast. Multiplanar CT image reconstructions of the cervical spine and maxillofacial structures were also generated. RADIATION DOSE REDUCTION: This exam was performed according to the departmental dose-optimization program which includes automated exposure control, adjustment of the mA and/or kV according to patient size and/or use of iterative reconstruction technique. COMPARISON:  09/19/2017 head CT FINDINGS: CT HEAD FINDINGS Brain: No evidence of acute infarction, hemorrhage, hydrocephalus, extra-axial collection or mass lesion/mass effect. Brain atrophy most pronounced in the biparietal region. Vascular: No hyperdense vessel or unexpected calcification. Skull: Deep laceration to the left forehead with undermining and flap of scalp folded laterally. Gas nearly reaches the bone. No underlying fracture or opaque foreign body. CT MAXILLOFACIAL FINDINGS Osseous: No acute fracture or mandibular dislocation. Orbits: No evidence of injury to the postseptal structures Sinuses: Left maxillary sinusitis with debris like appearance and partial mineralization. There is associated sclerotic wall thickening and bulging of the medial wall. Soft tissues: Forehead injury as above. CT CERVICAL SPINE FINDINGS Alignment: Normal. Skull base and vertebrae: No acute fracture. No primary bone lesion or focal pathologic process. Soft tissues and spinal canal: No prevertebral fluid  or swelling. No visible canal hematoma. Disc levels:  Cervical spine degeneration which is mild for age. Upper chest: No evidence of injury IMPRESSION: 1. Extensive left anterior scalp laceration without underlying fracture. 2. No evidence of intracranial or cervical spine injury. 3. Chronic left maxillary sinusitis. Electronically Signed   By: Tiburcio Pea M.D.   On: 12/27/2022 04:46   CT MAXILLOFACIAL WO CONTRAST  Result Date: 12/27/2022 CLINICAL DATA:  Fall when walking dog with forehead laceration. EXAM: CT HEAD WITHOUT CONTRAST CT MAXILLOFACIAL WITHOUT CONTRAST CT CERVICAL SPINE WITHOUT CONTRAST TECHNIQUE: Multidetector CT imaging of the head, cervical spine, and maxillofacial structures were performed using the standard protocol without intravenous contrast. Multiplanar CT image reconstructions of the cervical spine and maxillofacial structures were also generated. RADIATION DOSE REDUCTION: This exam was performed according to the departmental dose-optimization program which includes automated exposure control, adjustment of the mA and/or kV according to patient size and/or use of iterative reconstruction technique. COMPARISON:  09/19/2017 head CT FINDINGS: CT HEAD FINDINGS Brain: No evidence of acute infarction, hemorrhage, hydrocephalus, extra-axial collection or mass lesion/mass effect. Brain atrophy most pronounced in the biparietal region. Vascular: No hyperdense vessel or unexpected calcification. Skull: Deep laceration to the left forehead with undermining and flap of scalp folded laterally. Gas nearly reaches the bone. No underlying fracture or opaque foreign body. CT MAXILLOFACIAL FINDINGS Osseous: No acute fracture or mandibular dislocation. Orbits: No evidence of injury to the postseptal structures Sinuses: Left maxillary sinusitis with debris like appearance and partial mineralization. There is associated sclerotic wall thickening and bulging of the medial wall. Soft tissues: Forehead  injury as above. CT CERVICAL SPINE FINDINGS Alignment: Normal. Skull base and vertebrae: No acute fracture. No primary bone lesion or focal pathologic process. Soft tissues  and spinal canal: No prevertebral fluid or swelling. No visible canal hematoma. Disc levels:  Cervical spine degeneration which is mild for age. Upper chest: No evidence of injury IMPRESSION: 1. Extensive left anterior scalp laceration without underlying fracture. 2. No evidence of intracranial or cervical spine injury. 3. Chronic left maxillary sinusitis. Electronically Signed   By: Tiburcio Pea M.D.   On: 12/27/2022 04:46   CT CERVICAL SPINE WO CONTRAST  Result Date: 12/27/2022 CLINICAL DATA:  Fall when walking dog with forehead laceration. EXAM: CT HEAD WITHOUT CONTRAST CT MAXILLOFACIAL WITHOUT CONTRAST CT CERVICAL SPINE WITHOUT CONTRAST TECHNIQUE: Multidetector CT imaging of the head, cervical spine, and maxillofacial structures were performed using the standard protocol without intravenous contrast. Multiplanar CT image reconstructions of the cervical spine and maxillofacial structures were also generated. RADIATION DOSE REDUCTION: This exam was performed according to the departmental dose-optimization program which includes automated exposure control, adjustment of the mA and/or kV according to patient size and/or use of iterative reconstruction technique. COMPARISON:  09/19/2017 head CT FINDINGS: CT HEAD FINDINGS Brain: No evidence of acute infarction, hemorrhage, hydrocephalus, extra-axial collection or mass lesion/mass effect. Brain atrophy most pronounced in the biparietal region. Vascular: No hyperdense vessel or unexpected calcification. Skull: Deep laceration to the left forehead with undermining and flap of scalp folded laterally. Gas nearly reaches the bone. No underlying fracture or opaque foreign body. CT MAXILLOFACIAL FINDINGS Osseous: No acute fracture or mandibular dislocation. Orbits: No evidence of injury to the  postseptal structures Sinuses: Left maxillary sinusitis with debris like appearance and partial mineralization. There is associated sclerotic wall thickening and bulging of the medial wall. Soft tissues: Forehead injury as above. CT CERVICAL SPINE FINDINGS Alignment: Normal. Skull base and vertebrae: No acute fracture. No primary bone lesion or focal pathologic process. Soft tissues and spinal canal: No prevertebral fluid or swelling. No visible canal hematoma. Disc levels:  Cervical spine degeneration which is mild for age. Upper chest: No evidence of injury IMPRESSION: 1. Extensive left anterior scalp laceration without underlying fracture. 2. No evidence of intracranial or cervical spine injury. 3. Chronic left maxillary sinusitis. Electronically Signed   By: Tiburcio Pea M.D.   On: 12/27/2022 04:46    Pending Labs Unresulted Labs (From admission, onward)    None       Vitals/Pain Today's Vitals   12/27/22 0415 12/27/22 0418 12/27/22 0435 12/27/22 0530  BP: (!) 170/70  (!) 164/70 125/62  Pulse:   85 84  Resp:   17 (!) 26  Temp:  97.6 F (36.4 C)    TempSrc:  Oral    SpO2:   98% 91%  PainSc:        Isolation Precautions No active isolations  Medications Medications  fentaNYL (SUBLIMAZE) injection 12.5-50 mcg (has no administration in time range)  ondansetron (ZOFRAN) injection 4 mg (has no administration in time range)    Mobility Walks with device     Focused Assessments     R Recommendations: See Admitting Provider Note  Report given to:   Additional Notes:  Going to OR at this time for laceration repair.

## 2022-12-27 NOTE — Progress Notes (Signed)
Orthopedic Tech Progress Note Patient Details:  Jocelyn Wells [birth year] [id number] Aspen collar was delivered to bedside in PACU.  Ortho Devices Type of Ortho Device: Aspen cervical collar Ortho Device/Splint Interventions: Ordered      [name] E Buckley Bradly 12/27/2022, 8:35 AM

## 2022-12-27 NOTE — Transfer of Care (Signed)
Immediate Anesthesia Transfer of Care Note  Patient: Jocelyn Wells  Procedure(s) Performed: FACIAL LACERATION REPAIR  Patient Location: PACU  Anesthesia Type:General  Level of Consciousness: awake, alert , patient cooperative, and responds to stimulation  Airway & Oxygen Therapy: Patient Spontanous Breathing and Patient connected to face mask oxygen  Post-op Assessment: Report given to RN, Post -op Vital signs reviewed and stable, and Patient moving all extremities X 4  Post vital signs: Reviewed and stable  Last Vitals:  Vitals Value Taken Time  BP 158/91 12/27/22 0803  Temp    Pulse 88 12/27/22 0810  Resp 28 12/27/22 0810  SpO2 98 % 12/27/22 0810  Vitals shown include unfiled device data.  Last Pain:  Vitals:   12/27/22 0633  TempSrc: Oral  PainSc:          Complications: No notable events documented.

## 2022-12-27 NOTE — Plan of Care (Signed)

## 2022-12-27 NOTE — ED Notes (Signed)
EMS dressing replaced with pressure dressing by [facility] Link EMT. Pt transported to CT at this time on monitoring by this RN.

## 2022-12-27 NOTE — Anesthesia Procedure Notes (Signed)
Procedure Name: Intubation Date/Time: 12/27/2022 6:45 AM  Performed by: Shary Decamp, CRNAPre-anesthesia Checklist: Patient identified, Patient being monitored, Timeout performed, Emergency Drugs available and Suction available Patient Re-evaluated:Patient Re-evaluated prior to induction Oxygen Delivery Method: Circle System Utilized Preoxygenation: Pre-oxygenation with 100% oxygen Induction Type: IV induction Ventilation: Mask ventilation without difficulty Laryngoscope Size: Miller and 2 Grade View: Grade I Tube type: Oral Tube size: 7.0 mm Number of attempts: 1 Airway Equipment and Method: Stylet Placement Confirmation: ETT inserted through vocal cords under direct vision, positive ETCO2 and breath sounds checked- equal and bilateral Secured at: 21 cm Tube secured with: Tape Dental Injury: Teeth and Oropharynx as per pre-operative assessment

## 2022-12-27 NOTE — ED Notes (Signed)
Pt transported to OR at this time. Pts family took pts shirt and shoes home.

## 2022-12-27 NOTE — Anesthesia Preprocedure Evaluation (Signed)
Anesthesia Evaluation  Patient identified by MRN, date of birth, ID band Patient awake    Reviewed: Allergy & Precautions, H&P , NPO status , Patient's Chart, lab work & pertinent test results  Airway Mallampati: II   Neck ROM: full    Dental   Pulmonary shortness of breath, COPD, former smoker   breath sounds clear to auscultation       Cardiovascular hypertension, + Peripheral Vascular Disease and +CHF   Rhythm:regular Rate:Normal     Neuro/Psych  Headaches PSYCHIATRIC DISORDERS Anxiety Depression    CVA    GI/Hepatic   Endo/Other  diabetes, Type 2    Renal/GU      Musculoskeletal  (+) Arthritis ,    Abdominal   Peds  Hematology   Anesthesia Other Findings   Reproductive/Obstetrics                             Anesthesia Physical Anesthesia Plan  ASA: 3  Anesthesia Plan: General   Post-op Pain Management:    Induction: Intravenous  PONV Risk Score and Plan: 3 and Ondansetron, Dexamethasone and Treatment may vary due to age or medical condition  Airway Management Planned: Oral ETT  Additional Equipment:   Intra-op Plan:   Post-operative Plan: Extubation in OR  Informed Consent: I have reviewed the patients History and Physical, chart, labs and discussed the procedure including the risks, benefits and alternatives for the proposed anesthesia with the patient or authorized representative who has indicated his/her understanding and acceptance.     Dental advisory given  Plan Discussed with: CRNA, Anesthesiologist and Surgeon  Anesthesia Plan Comments:        Anesthesia Quick Evaluation

## 2022-12-27 NOTE — Consult Note (Signed)
Renal Service Consult Note [facility]  Jocelyn Wells 12/27/2022 [name], MD Requesting Physician: Dr. Elvera Lennox  Reason for Consult: ESRD pt sp fall while walking dog HPI: The patient is a 67 y.o. year-old w/ PMH as below who presented to ED from home after a fall which happened while walking her dog. There was a forehead laceration and skin tears to the arms. Pt on eliquis. Hx CVA. Hx of freqent falls. Last HD was 11/14, missed her HD on 11/16 due to nausea. In ED BP was 160/70, HR 80s, RR 18, afeb.  2-4 L O2 by [place]. In ED she rec'd SQ insulin, fentanyl IV.  Pt to be admitted to hospitalist service. We are asked to see for dialysis.   Pt seen  in room, pt is groggy post -op, no  hx obtained. Out of town son is here , not that sure about her recent history.    ROS - n/a  Past Medical History  Past Medical History:  Diagnosis Date   Acute ischemic left MCA stroke (HCC) 01/29/2015   a.) MRI brain 01/29/2015: acute left MCA territory infarcts involving the posterior left frontal lobe and left insular cortex   Anemia of chronic renal failure    Anxiety    Arthritis    Complication of anesthesia    a.) PONV; b.) awareness under anesthesia   COPD (chronic obstructive pulmonary disease) (HCC)    Depression    Dyspnea    ESRD (end stage renal disease) (HCC)    Headache    HFrEF (heart failure with reduced ejection fraction) (HCC)    a.) TTE 01/30/2015: EF 30-35%, diff HK, LAE, G2DD; b.) TTE 06/13/2015: EF 40-45%, diff HK, G1DD; c.) TTE 10/06/2017: EF 60-65% MAC, no IAS; d.) TTE 09/29/2021: EF 45-50%;, LVH, LAE, AoV sclerosis, G1DD; e.) TTE 02/24/2022: EF 60-65%, no IAS   HTN (hypertension)    Leukocytosis    Long term current use of anticoagulant    a.) apixaban   Long term current use of antithrombotics/antiplatelets    a.) clopidogrel   NICM (nonischemic cardiomyopathy) (HCC)    a.) TTE 01/30/2015: EF 30-35%; b.) R/LHC 03/25/2015: EF 25-30%, mRA 13, mPA 30,  mPCWP 26, LVEDP 17, CO 3.66, CI 1.8; c.) TTE 06/13/2015: EF 40-45%; d.) TTE 10/06/2017: EF 60-65%; e.) TTE 09/29/2021: EF 45-50%; f.) TTE 02/24/2022: EF 60-65%   Obesity    On supplemental oxygen by nasal cannula    a.) 2L/[place] at night   Osteoporosis    Peripheral vascular disease (HCC)    Type 2 diabetes mellitus treated with insulin [facility])    Past Surgical History  Past Surgical History:  Procedure Laterality Date   ABDOMINAL HYSTERECTOMY     ANKLE FRACTURE SURGERY Left 02/09/2000   AV FISTULA PLACEMENT Left 06/18/2022   Procedure: ARTERIOVENOUS (AV) FISTULA CREATION (BRACHIAL AXILLA);  Surgeon: Renford Dills, MD;  Location: [facility] ORS;  Service: Vascular;  Laterality: Left;   BILATERAL SALPINGOOPHORECTOMY  02/08/1998   CARDIAC CATHETERIZATION N/A 03/25/2015   Procedure: Right/Left Heart Cath and Coronary Angiography;  Surgeon: Lyn Records, MD;  Location: [facility] INVASIVE CV LAB;  Service: Cardiovascular;  Laterality: N/A;   CESAREAN SECTION  02/09/1976   COLONOSCOPY WITH PROPOFOL N/A 09/22/2016   Procedure: COLONOSCOPY WITH PROPOFOL;  Surgeon: Scot Jun, MD;  Location: [facility] ENDOSCOPY;  Service: Endoscopy;  Laterality: N/A;   COLONOSCOPY WITH PROPOFOL N/A 02/15/2022   Procedure: COLONOSCOPY WITH PROPOFOL;  Surgeon: Toney Reil, MD;  Location: [facility] ENDOSCOPY;  Service: Gastroenterology;  Laterality: N/A;   EP IMPLANTABLE DEVICE N/A 08/05/2015   Procedure: Loop Recorder Insertion;  Surgeon: Duke Salvia, MD;  Location: [facility] INVASIVE CV LAB;  Service: Cardiovascular;  Laterality: N/A;   ESOPHAGOGASTRODUODENOSCOPY N/A 02/15/2022   Procedure: ESOPHAGOGASTRODUODENOSCOPY (EGD);  Surgeon: Toney Reil, MD;  Location: [facility] ENDOSCOPY;  Service: Gastroenterology;  Laterality: N/A;   ESOPHAGOGASTRODUODENOSCOPY (EGD) WITH PROPOFOL N/A 09/22/2016   Procedure: ESOPHAGOGASTRODUODENOSCOPY (EGD) WITH PROPOFOL;  Surgeon: Scot Jun, MD;  Location: [facility] ENDOSCOPY;  Service:  Endoscopy;  Laterality: N/A;   ESOPHAGOGASTRODUODENOSCOPY (EGD) WITH PROPOFOL N/A 12/08/2016   Procedure: ESOPHAGOGASTRODUODENOSCOPY (EGD) WITH PROPOFOL;  Surgeon: Scot Jun, MD;  Location: [facility] ENDOSCOPY;  Service: Endoscopy;  Laterality: N/A;   ESOPHAGOGASTRODUODENOSCOPY (EGD) WITH PROPOFOL N/A 10/12/2022   Procedure: ESOPHAGOGASTRODUODENOSCOPY (EGD) WITH PROPOFOL;  Surgeon: Toney Reil, MD;  Location: [facility] ENDOSCOPY;  Service: Gastroenterology;  Laterality: N/A;   FRACTURE SURGERY     GIVENS CAPSULE STUDY N/A 04/19/2022   Procedure: GIVENS CAPSULE STUDY;  Surgeon: Toney Reil, MD;  Location: [facility] ENDOSCOPY;  Service: Gastroenterology;  Laterality: N/A;   GIVENS CAPSULE STUDY N/A 10/12/2022   Procedure: GIVENS CAPSULE STUDY;  Surgeon: Toney Reil, MD;  Location: [facility] ENDOSCOPY;  Service: Gastroenterology;  Laterality: N/A;   INSERTION HYBRID ANTERIOVENOUS GORTEX GRAFT  06/18/2022   Procedure: INSERTION HYBRID ANTERIOVENOUS GORTEX GRAFT;  Surgeon: Renford Dills, MD;  Location: [facility] ORS;  Service: Vascular;;   KNEE ARTHROSCOPY Left 02/09/2003   LOWER EXTREMITY ANGIOGRAPHY Left 08/12/2021   Procedure: Lower Extremity Angiography;  Surgeon: Renford Dills, MD;  Location: [facility] INVASIVE CV LAB;  Service: Cardiovascular;  Laterality: Left;   TEE WITHOUT CARDIOVERSION N/A 01/31/2015   Procedure: TRANSESOPHAGEAL ECHOCARDIOGRAM (TEE);  Surgeon: Lewayne Bunting, MD;  Location: [facility] ENDOSCOPY;  Service: Cardiovascular;  Laterality: N/A;   TEMPORARY DIALYSIS CATHETER N/A 08/14/2021   Procedure: TEMPORARY DIALYSIS CATHETER;  Surgeon: Renford Dills, MD;  Location: [facility] INVASIVE CV LAB;  Service: Cardiovascular;  Laterality: N/A;   TUBAL LIGATION  02/09/1976   ULNAR NERVE TRANSPOSITION  02/08/2006   Family History  Family History  Problem Relation Age of Onset   Heart disease Mother        died from CHF   Asthma Mother    Diabetes Mother    Heart disease Father     Aneurysm Father    COPD Brother    Diabetes Brother    Alcohol abuse Paternal Aunt    Cancer Maternal Grandmother        unknownorigin   Anemia Neg Hx    Arrhythmia Neg Hx    Clotting disorder Neg Hx    Fainting Neg Hx    Heart attack Neg Hx    Heart failure Neg Hx    Hyperlipidemia Neg Hx    Hypertension Neg Hx    Social History  reports that she quit smoking about 6 months ago. Her smoking use included cigarettes. She started smoking about 48 years ago. She has a 24.2 pack-year smoking history. She has never used smokeless tobacco. She reports that she does not drink alcohol and does not use drugs. Allergies  Allergies  Allergen Reactions   Codeine Nausea And Vomiting   Ivp Dye [Iodinated Contrast Media]     Patients states the IV Dye shuts her kidneys down   Home medications Prior to Admission medications   Medication Sig Start Date End Date Taking? Authorizing Provider  albuterol (  VENTOLIN HFA) 108 (90 Base) MCG/ACT inhaler TAKE 2 PUFFS BY MOUTH EVERY 6 HOURS AS NEEDED FOR WHEEZE OR SHORTNESS OF BREATH Patient taking differently: Inhale 2 puffs into the lungs every 6 (six) hours as needed for wheezing or shortness of breath. 06/19/21   Jacky Kindle, FNP  apixaban (ELIQUIS) 5 MG TABS tablet Take 1 tablet (5 mg total) by mouth 2 (two) times daily. 11/29/22   Charlsie Quest, NP  atorvastatin (LIPITOR) 40 MG tablet Take 1 tablet (40 mg total) by mouth daily. 09/08/21   Jacky Kindle, FNP  buPROPion [facility] SR) 150 MG 12 hr tablet TAKE 1 TABLET BY MOUTH TWICE A DAY 12/08/22   Merita Norton T, FNP  calcitRIOL (ROCALTROL) 0.25 MCG capsule Take 0.25 mcg by mouth daily. 03/15/22   [provider]  Dulaglutide (TRULICITY) 4.5 MG/0.5ML SOPN Inject 4.5 mg into the skin every 7 (seven) days. Patient taking differently: Inject 4.5 mg into the skin every 7 (seven) days. Mondays 07/12/22     fluticasone (FLONASE) 50 MCG/ACT nasal spray Place 2 sprays into both nostrils  daily. Patient taking differently: Place 2 sprays into both nostrils as needed. 02/13/21   Jacky Kindle, FNP  gabapentin (NEURONTIN) 300 MG capsule Take 300 mg by mouth daily.    [provider]  glucose blood test strip     [provider]  hydroxyurea (HYDREA) 500 MG capsule Take 1 capsule (500 mg total) by mouth every other day. May take with food to minimize GI side effects. 10/06/22   Creig Hines, MD  insulin aspart (NOVOLOG) 100 UNIT/ML injection Insulin pump 05/26/20   [provider]  Insulin Disposable Pump (OMNIPOD DASH PODS, GEN 4,) MISC USE 1 POD EACH EVERY 72    HOURS 10/29/20   [provider]  Insulin Human (INSULIN PUMP) SOLN Per endocrinoloy 06/01/20   Elgergawy, Leana Roe, MD  loratadine (CLARITIN) 10 MG tablet Take 1 tablet by mouth daily.    [provider]  meclizine (ANTIVERT) 12.5 MG tablet Take 1 tablet (12.5 mg total) by mouth 3 (three) times daily as needed for dizziness. 11/23/21   Caro Laroche, DO  OXYGEN Place 2 L into the nose continuous.    [provider]  PARoxetine (PAXIL) 40 MG tablet Take 1 tablet (40 mg total) by mouth daily. 04/10/22   Neysa Hotter, MD  Spacer/Aero-Holding Chambers [facility] DIAMOND) MISC Use with inhaler to ensure medication is delivered throughout lungs 02/13/21   Jacky Kindle, FNP  Tiotropium Bromide-Olodaterol (STIOLTO RESPIMAT) 2.5-2.5 MCG/ACT AERS Inhale 2 puffs into the lungs daily. 11/29/22   Salena Saner, MD  torsemide (DEMADEX) 20 MG tablet TAKE 3 TABLETS BY MOUTH EVERY DAY Patient taking differently: TAKE 2 TABLETS BY MOUTH EVERY DAY 11/04/22   Delma Freeze, FNP  traMADol (ULTRAM) 50 MG tablet Take 1 tablet (50 mg total) by mouth every 12 (twelve) hours as needed for severe pain. 11/24/22 02/22/23  Edward Jolly, MD  Vitamin D, Ergocalciferol, (DRISDOL) 1.25 MG (50000 UNIT) CAPS capsule Take 50,000 Units by mouth every 7 (seven) days. Sunday Patient not taking:  Reported on 12/14/2022 01/24/22   [provider]     Vitals:   12/27/22 0805 12/27/22 0815 12/27/22 0820 12/27/22 0830  BP: (!) 158/91 (!) 148/63  (!) 149/59  Pulse: 90 88 88 89  Resp: 19 (!) 29 18 (!) 26  Temp:      TempSrc:      SpO2: 98%  91% 93% 93%  Weight:      Height:       Exam Gen groggy, somnolent, head is wrapped + C collar Sclera anicteric, throat not seen No jvd or bruits Chest clear bilat to bases, no rales/ wheezing RRR no RG Abd soft ntnd no mass or ascites +bs GU defer MS no joint effusions or deformity Ext 1+ LE edema, no wounds or ulcers Neuro is alert, Ox 3 , nf    LUA AVF+bruit       Renal-related home meds: - eliquis 5 bid - home O2 2 L continuous - demadex 60mg  every day - ultram prn  - rocaltrol 0.25 mcg every day - gabapentin 300 qd    OP HD:  [facility]  TTS  4h  88kg   LUA AVF   Heparin 1500 bolus x 1    Na 134  K 3.8  BUN 23   creat 4.21   Ca 9.6  alb 2.9   Hb 10.5  wbc 11K    Assessment/ Plan: SP fall - w/ traumatic laceration to forehead, skin tears on the arms. Went to OR today. Per surgeons.  ESKD - on HD TTS. Missed last HD on 11/16, last HD was 11/14. Labs and volume are okay. Plan HD tomorrow on schedule.  HTN - BP's stable here, not on BP lowering meds at home is PTA meds are accurate. Follow.  Volume - no gross volume overload. 1kg up by wts. No gross vol overload on exam. UF 2-2.5 L w/ HD tomorrow.  Anemia of eskd - Hb 10-12 here, follow.  MBD ckd - CCa in range. Add on phos. Follow.     Vinson Moselle  MD [facility] 12/27/2022, 8:49 AM  Recent Labs  Lab 12/27/22 0412 12/27/22 0421  HGB 10.5* 11.9*  ALBUMIN 2.9*  --   CALCIUM 9.6  --   CREATININE 4.21* 4.40*  K 3.8 3.9   Inpatient medications:  [MAR Hold] arformoterol  15 mcg Nebulization BID   And   [MAR Hold] umeclidinium bromide  1 puff Inhalation Daily   [MAR Hold] atorvastatin  40 mg Oral Daily   [MAR Hold] buPROPion  150 mg Oral BID    [MAR Hold] calcitRIOL  0.25 mcg Oral Daily   [MAR Hold] gabapentin  300 mg Oral Daily   [MAR Hold] insulin aspart  0-6 Units Subcutaneous Q4H   [MAR Hold] insulin glargine-yfgn  15 Units Subcutaneous Daily   [MAR Hold] PARoxetine  40 mg Oral Daily   [MAR Hold] sodium chloride flush  3 mL Intravenous Q12H    [MAR Hold] acetaminophen **OR** [MAR Hold] acetaminophen, [MAR Hold] albuterol, [MAR Hold] fentaNYL (SUBLIMAZE) injection, fentaNYL (SUBLIMAZE) injection, [MAR Hold] ondansetron (ZOFRAN) IV, ondansetron (ZOFRAN) IV, [MAR Hold] oxyCODONE, oxyCODONE **OR** oxyCODONE

## 2022-12-27 NOTE — H&P (Signed)
History and Physical    Jocelyn Wells ZOX:[id number] DOB: 03-20-1955 DOA: 12/27/2022  PCP: Jacky Kindle, FNP   Patient coming from: Home   Chief Complaint: Fall, head injury  HPI: Jocelyn Wells is a 67 y.o. female with medical history significant for depression, anxiety, hypertension, insulin-dependent diabetes mellitus, COPD, nocturnal supplemental oxygen requirement, essential thrombocytosis, and ESRD on hemodialysis who presents with anterior scalp laceration after a trip and fall at home.   Patient states that she was in her usual condition when she bent over to clean up after her dog and fell forward, striking her forehead on a table.  This resulted in a large anterior scalp laceration with significant bleeding.  EMS was called and she was brought into the ED.  She has a long history of frequent falls which tend to occur when she does not use her walker as directed.  She missed her dialysis appointment on 12/25/2022 due to nausea and vomiting which has since resolved.  She reports completing dialysis on 12/23/2022.  She denies shortness of breath, chest pain, fever, or chills.  ED Course: Upon arrival to the ED, patient is found to be afebrile and saturating well on room air with normal heart rate and stable blood pressure.  Labs are most notable for glucose 360, albumin 2.9, and INR 1.6.  CT is negative for acute intracranial or acute cervical spine abnormality but notable for extensive left anterior scalp laceration without underlying fracture.  ENT (Dr. Sharrie Rothman) was consulted with ED physician and hospitalists were asked to admit.  Review of Systems:  All other systems reviewed and apart from HPI, are negative.  Past Medical History:  Diagnosis Date   Acute ischemic left MCA stroke (HCC) 01/29/2015   a.) MRI brain 01/29/2015: acute left MCA territory infarcts involving the posterior left frontal lobe and left insular cortex   Anemia of chronic renal failure    Anxiety     Arthritis    Complication of anesthesia    a.) PONV; b.) awareness under anesthesia   COPD (chronic obstructive pulmonary disease) (HCC)    Depression    Dyspnea    ESRD (end stage renal disease) (HCC)    Headache    HFrEF (heart failure with reduced ejection fraction) (HCC)    a.) TTE 01/30/2015: EF 30-35%, diff HK, LAE, G2DD; b.) TTE 06/13/2015: EF 40-45%, diff HK, G1DD; c.) TTE 10/06/2017: EF 60-65% MAC, no IAS; d.) TTE 09/29/2021: EF 45-50%;, LVH, LAE, AoV sclerosis, G1DD; e.) TTE 02/24/2022: EF 60-65%, no IAS   HTN (hypertension)    Leukocytosis    Long term current use of anticoagulant    a.) apixaban   Long term current use of antithrombotics/antiplatelets    a.) clopidogrel   NICM (nonischemic cardiomyopathy) (HCC)    a.) TTE 01/30/2015: EF 30-35%; b.) R/LHC 03/25/2015: EF 25-30%, mRA 13, mPA 30, mPCWP 26, LVEDP 17, CO 3.66, CI 1.8; c.) TTE 06/13/2015: EF 40-45%; d.) TTE 10/06/2017: EF 60-65%; e.) TTE 09/29/2021: EF 45-50%; f.) TTE 02/24/2022: EF 60-65%   Obesity    On supplemental oxygen by nasal cannula    a.) 2L/[place] at night   Osteoporosis    Peripheral vascular disease (HCC)    Type 2 diabetes mellitus treated with insulin [facility])     Past Surgical History:  Procedure Laterality Date   ABDOMINAL HYSTERECTOMY     ANKLE FRACTURE SURGERY Left 02/09/2000   AV FISTULA PLACEMENT Left 06/18/2022   Procedure: ARTERIOVENOUS (AV) FISTULA CREATION (BRACHIAL  AXILLA);  Surgeon: Renford Dills, MD;  Location: [facility] ORS;  Service: Vascular;  Laterality: Left;   BILATERAL SALPINGOOPHORECTOMY  02/08/1998   CARDIAC CATHETERIZATION N/A 03/25/2015   Procedure: Right/Left Heart Cath and Coronary Angiography;  Surgeon: Lyn Records, MD;  Location: [facility] INVASIVE CV LAB;  Service: Cardiovascular;  Laterality: N/A;   CESAREAN SECTION  02/09/1976   COLONOSCOPY WITH PROPOFOL N/A 09/22/2016   Procedure: COLONOSCOPY WITH PROPOFOL;  Surgeon: Scot Jun, MD;  Location: [facility] ENDOSCOPY;   Service: Endoscopy;  Laterality: N/A;   COLONOSCOPY WITH PROPOFOL N/A 02/15/2022   Procedure: COLONOSCOPY WITH PROPOFOL;  Surgeon: Toney Reil, MD;  Location: [facility] ENDOSCOPY;  Service: Gastroenterology;  Laterality: N/A;   EP IMPLANTABLE DEVICE N/A 08/05/2015   Procedure: Loop Recorder Insertion;  Surgeon: Duke Salvia, MD;  Location: [facility] INVASIVE CV LAB;  Service: Cardiovascular;  Laterality: N/A;   ESOPHAGOGASTRODUODENOSCOPY N/A 02/15/2022   Procedure: ESOPHAGOGASTRODUODENOSCOPY (EGD);  Surgeon: Toney Reil, MD;  Location: [facility] ENDOSCOPY;  Service: Gastroenterology;  Laterality: N/A;   ESOPHAGOGASTRODUODENOSCOPY (EGD) WITH PROPOFOL N/A 09/22/2016   Procedure: ESOPHAGOGASTRODUODENOSCOPY (EGD) WITH PROPOFOL;  Surgeon: Scot Jun, MD;  Location: [facility] ENDOSCOPY;  Service: Endoscopy;  Laterality: N/A;   ESOPHAGOGASTRODUODENOSCOPY (EGD) WITH PROPOFOL N/A 12/08/2016   Procedure: ESOPHAGOGASTRODUODENOSCOPY (EGD) WITH PROPOFOL;  Surgeon: Scot Jun, MD;  Location: [facility] ENDOSCOPY;  Service: Endoscopy;  Laterality: N/A;   ESOPHAGOGASTRODUODENOSCOPY (EGD) WITH PROPOFOL N/A 10/12/2022   Procedure: ESOPHAGOGASTRODUODENOSCOPY (EGD) WITH PROPOFOL;  Surgeon: Toney Reil, MD;  Location: [facility] ENDOSCOPY;  Service: Gastroenterology;  Laterality: N/A;   FRACTURE SURGERY     GIVENS CAPSULE STUDY N/A 04/19/2022   Procedure: GIVENS CAPSULE STUDY;  Surgeon: Toney Reil, MD;  Location: [facility] ENDOSCOPY;  Service: Gastroenterology;  Laterality: N/A;   GIVENS CAPSULE STUDY N/A 10/12/2022   Procedure: GIVENS CAPSULE STUDY;  Surgeon: Toney Reil, MD;  Location: [facility] ENDOSCOPY;  Service: Gastroenterology;  Laterality: N/A;   INSERTION HYBRID ANTERIOVENOUS GORTEX GRAFT  06/18/2022   Procedure: INSERTION HYBRID ANTERIOVENOUS GORTEX GRAFT;  Surgeon: Renford Dills, MD;  Location: [facility] ORS;  Service: Vascular;;   KNEE ARTHROSCOPY Left 02/09/2003   LOWER EXTREMITY ANGIOGRAPHY Left  08/12/2021   Procedure: Lower Extremity Angiography;  Surgeon: Renford Dills, MD;  Location: [facility] INVASIVE CV LAB;  Service: Cardiovascular;  Laterality: Left;   TEE WITHOUT CARDIOVERSION N/A 01/31/2015   Procedure: TRANSESOPHAGEAL ECHOCARDIOGRAM (TEE);  Surgeon: Lewayne Bunting, MD;  Location: [facility] ENDOSCOPY;  Service: Cardiovascular;  Laterality: N/A;   TEMPORARY DIALYSIS CATHETER N/A 08/14/2021   Procedure: TEMPORARY DIALYSIS CATHETER;  Surgeon: Renford Dills, MD;  Location: [facility] INVASIVE CV LAB;  Service: Cardiovascular;  Laterality: N/A;   TUBAL LIGATION  02/09/1976   ULNAR NERVE TRANSPOSITION  02/08/2006    Social History:   reports that she quit smoking about 6 months ago. Her smoking use included cigarettes. She started smoking about 48 years ago. She has a 24.2 pack-year smoking history. She has never used smokeless tobacco. She reports that she does not drink alcohol and does not use drugs.  Allergies  Allergen Reactions   Codeine Nausea And Vomiting   Ivp Dye [Iodinated Contrast Media]     Patients states the IV Dye shuts her kidneys down    Family History  Problem Relation Age of Onset   Heart disease Mother        died from CHF   Asthma Mother    Diabetes Mother    Heart  disease Father    Aneurysm Father    COPD Brother    Diabetes Brother    Alcohol abuse Paternal Aunt    Cancer Maternal Grandmother        unknownorigin   Anemia Neg Hx    Arrhythmia Neg Hx    Clotting disorder Neg Hx    Fainting Neg Hx    Heart attack Neg Hx    Heart failure Neg Hx    Hyperlipidemia Neg Hx    Hypertension Neg Hx      Prior to Admission medications   Medication Sig Start Date End Date Taking? Authorizing Provider  albuterol (VENTOLIN HFA) 108 (90 Base) MCG/ACT inhaler TAKE 2 PUFFS BY MOUTH EVERY 6 HOURS AS NEEDED FOR WHEEZE OR SHORTNESS OF BREATH Patient taking differently: Inhale 2 puffs into the lungs every 6 (six) hours as needed for wheezing or shortness of  breath. 06/19/21   Jacky Kindle, FNP  apixaban (ELIQUIS) 5 MG TABS tablet Take 1 tablet (5 mg total) by mouth 2 (two) times daily. 11/29/22   Charlsie Quest, NP  atorvastatin (LIPITOR) 40 MG tablet Take 1 tablet (40 mg total) by mouth daily. 09/08/21   Jacky Kindle, FNP  buPROPion [facility] SR) 150 MG 12 hr tablet TAKE 1 TABLET BY MOUTH TWICE A DAY 12/08/22   Merita Norton T, FNP  calcitRIOL (ROCALTROL) 0.25 MCG capsule Take 0.25 mcg by mouth daily. 03/15/22   [provider]  Dulaglutide (TRULICITY) 4.5 MG/0.5ML SOPN Inject 4.5 mg into the skin every 7 (seven) days. Patient taking differently: Inject 4.5 mg into the skin every 7 (seven) days. Mondays 07/12/22     fluticasone (FLONASE) 50 MCG/ACT nasal spray Place 2 sprays into both nostrils daily. Patient taking differently: Place 2 sprays into both nostrils as needed. 02/13/21   Jacky Kindle, FNP  gabapentin (NEURONTIN) 300 MG capsule Take 300 mg by mouth daily.    [provider]  glucose blood test strip     [provider]  hydroxyurea (HYDREA) 500 MG capsule Take 1 capsule (500 mg total) by mouth every other day. May take with food to minimize GI side effects. 10/06/22   Creig Hines, MD  insulin aspart (NOVOLOG) 100 UNIT/ML injection Insulin pump 05/26/20   [provider]  Insulin Disposable Pump (OMNIPOD DASH PODS, GEN 4,) MISC USE 1 POD EACH EVERY 72    HOURS 10/29/20   [provider]  Insulin Human (INSULIN PUMP) SOLN Per endocrinoloy 06/01/20   Elgergawy, Leana Roe, MD  loratadine (CLARITIN) 10 MG tablet Take 1 tablet by mouth daily.    [provider]  meclizine (ANTIVERT) 12.5 MG tablet Take 1 tablet (12.5 mg total) by mouth 3 (three) times daily as needed for dizziness. 11/23/21   Caro Laroche, DO  OXYGEN Place 2 L into the nose continuous.    [provider]  PARoxetine (PAXIL) 40 MG tablet Take 1 tablet (40 mg total) by mouth daily. 04/10/22   Neysa Hotter, MD   Spacer/Aero-Holding Chambers [facility] DIAMOND) MISC Use with inhaler to ensure medication is delivered throughout lungs 02/13/21   Jacky Kindle, FNP  Tiotropium Bromide-Olodaterol (STIOLTO RESPIMAT) 2.5-2.5 MCG/ACT AERS Inhale 2 puffs into the lungs daily. 11/29/22   Salena Saner, MD  torsemide (DEMADEX) 20 MG tablet TAKE 3 TABLETS BY MOUTH EVERY DAY Patient taking differently: TAKE 2 TABLETS BY MOUTH EVERY DAY 11/04/22   Delma Freeze, FNP  traMADol (ULTRAM) 50 MG  tablet Take 1 tablet (50 mg total) by mouth every 12 (twelve) hours as needed for severe pain. 11/24/22 02/22/23  Edward Jolly, MD  Vitamin D, Ergocalciferol, (DRISDOL) 1.25 MG (50000 UNIT) CAPS capsule Take 50,000 Units by mouth every 7 (seven) days. Sunday Patient not taking: Reported on 12/14/2022 01/24/22   [provider]    Physical Exam: Vitals:   12/27/22 0415 12/27/22 0418 12/27/22 0435 12/27/22 0530  BP: (!) 170/70  (!) 164/70 125/62  Pulse:   85 84  Resp:   17 (!) 26  Temp:  97.6 F (36.4 C)    TempSrc:  Oral    SpO2:   98% 91%    Constitutional: NAD, pale   Eyes: PERTLA, lids and conjunctivae normal ENMT: Mucous membranes are moist. Posterior pharynx clear of any exudate or lesions.   Neck: supple, no masses  Respiratory: no wheezing, no crackles. No accessory muscle use.  Cardiovascular: S1 & S2 heard, regular rate and rhythm. Mild lower extremity edema.   Abdomen: No distension, no tenderness, soft. Bowel sounds active.  Musculoskeletal: no clubbing / cyanosis. No joint deformity upper and lower extremities.   Skin: no significant rashes, lesions, ulcers. Warm, dry, well-perfused. Neurologic: CN 2-12 grossly intact. Moving all extremities. Alert and oriented.  Psychiatric: Calm. Cooperative.    Labs and Imaging on Admission: I have personally reviewed following labs and imaging studies  CBC: Recent Labs  Lab 12/27/22 0412 12/27/22 0421  WBC 11.6*  --   HGB 10.5* 11.9*  HCT 35.6*  35.0*  MCV 91.8  --   PLT 488*  --    Basic Metabolic Panel: Recent Labs  Lab 12/27/22 0412 12/27/22 0421  NA 134* 137  K 3.8 3.9  CL 98 99  CO2 23  --   GLUCOSE 360* 362*  BUN 23 27*  CREATININE 4.21* 4.40*  CALCIUM 9.6  --    GFR: CrCl cannot be calculated (Unknown ideal weight.). Liver Function Tests: Recent Labs  Lab 12/27/22 0412  AST 13*  ALT 12  ALKPHOS 83  BILITOT 0.5  PROT 6.1*  ALBUMIN 2.9*   No results for input(s): "LIPASE", "AMYLASE" in the last 168 hours. No results for input(s): "AMMONIA" in the last 168 hours. Coagulation Profile: Recent Labs  Lab 12/27/22 0412  INR 1.6*   Cardiac Enzymes: No results for input(s): "CKTOTAL", "CKMB", "CKMBINDEX", "TROPONINI" in the last 168 hours. BNP (last 3 results) No results for input(s): "PROBNP" in the last 8760 hours. HbA1C: No results for input(s): "HGBA1C" in the last 72 hours. CBG: No results for input(s): "GLUCAP" in the last 168 hours. Lipid Profile: No results for input(s): "CHOL", "HDL", "LDLCALC", "TRIG", "CHOLHDL", "LDLDIRECT" in the last 72 hours. Thyroid Function Tests: No results for input(s): "TSH", "T4TOTAL", "FREET4", "T3FREE", "THYROIDAB" in the last 72 hours. Anemia Panel: No results for input(s): "VITAMINB12", "FOLATE", "FERRITIN", "TIBC", "IRON", "RETICCTPCT" in the last 72 hours. Urine analysis:    Component Value Date/Time   COLORURINE YELLOW (A) 01/01/2022 2105   APPEARANCEUR HAZY (A) 01/01/2022 2105   LABSPEC 1.017 01/01/2022 2105   PHURINE 5.0 01/01/2022 2105   GLUCOSEU 50 (A) 01/01/2022 2105   HGBUR SMALL (A) 01/01/2022 2105   BILIRUBINUR NEGATIVE 01/01/2022 2105   KETONESUR NEGATIVE 01/01/2022 2105   PROTEINUR >=300 (A) 01/01/2022 2105   NITRITE NEGATIVE 01/01/2022 2105   LEUKOCYTESUR NEGATIVE 01/01/2022 2105   Sepsis Labs: @LABRCNTIP (procalcitonin:4,lacticidven:4) )No results found for this or any previous visit (from the past 240 hour(s)).  Radiological Exams on  Admission: CT HEAD WO CONTRAST  Result Date: 12/27/2022 CLINICAL DATA:  Fall when walking dog with forehead laceration. EXAM: CT HEAD WITHOUT CONTRAST CT MAXILLOFACIAL WITHOUT CONTRAST CT CERVICAL SPINE WITHOUT CONTRAST TECHNIQUE: Multidetector CT imaging of the head, cervical spine, and maxillofacial structures were performed using the standard protocol without intravenous contrast. Multiplanar CT image reconstructions of the cervical spine and maxillofacial structures were also generated. RADIATION DOSE REDUCTION: This exam was performed according to the departmental dose-optimization program which includes automated exposure control, adjustment of the mA and/or kV according to patient size and/or use of iterative reconstruction technique. COMPARISON:  09/19/2017 head CT FINDINGS: CT HEAD FINDINGS Brain: No evidence of acute infarction, hemorrhage, hydrocephalus, extra-axial collection or mass lesion/mass effect. Brain atrophy most pronounced in the biparietal region. Vascular: No hyperdense vessel or unexpected calcification. Skull: Deep laceration to the left forehead with undermining and flap of scalp folded laterally. Gas nearly reaches the bone. No underlying fracture or opaque foreign body. CT MAXILLOFACIAL FINDINGS Osseous: No acute fracture or mandibular dislocation. Orbits: No evidence of injury to the postseptal structures Sinuses: Left maxillary sinusitis with debris like appearance and partial mineralization. There is associated sclerotic wall thickening and bulging of the medial wall. Soft tissues: Forehead injury as above. CT CERVICAL SPINE FINDINGS Alignment: Normal. Skull base and vertebrae: No acute fracture. No primary bone lesion or focal pathologic process. Soft tissues and spinal canal: No prevertebral fluid or swelling. No visible canal hematoma. Disc levels:  Cervical spine degeneration which is mild for age. Upper chest: No evidence of injury IMPRESSION: 1. Extensive left anterior scalp  laceration without underlying fracture. 2. No evidence of intracranial or cervical spine injury. 3. Chronic left maxillary sinusitis. Electronically Signed   By: Tiburcio Pea M.D.   On: 12/27/2022 04:46   CT MAXILLOFACIAL WO CONTRAST  Result Date: 12/27/2022 CLINICAL DATA:  Fall when walking dog with forehead laceration. EXAM: CT HEAD WITHOUT CONTRAST CT MAXILLOFACIAL WITHOUT CONTRAST CT CERVICAL SPINE WITHOUT CONTRAST TECHNIQUE: Multidetector CT imaging of the head, cervical spine, and maxillofacial structures were performed using the standard protocol without intravenous contrast. Multiplanar CT image reconstructions of the cervical spine and maxillofacial structures were also generated. RADIATION DOSE REDUCTION: This exam was performed according to the departmental dose-optimization program which includes automated exposure control, adjustment of the mA and/or kV according to patient size and/or use of iterative reconstruction technique. COMPARISON:  09/19/2017 head CT FINDINGS: CT HEAD FINDINGS Brain: No evidence of acute infarction, hemorrhage, hydrocephalus, extra-axial collection or mass lesion/mass effect. Brain atrophy most pronounced in the biparietal region. Vascular: No hyperdense vessel or unexpected calcification. Skull: Deep laceration to the left forehead with undermining and flap of scalp folded laterally. Gas nearly reaches the bone. No underlying fracture or opaque foreign body. CT MAXILLOFACIAL FINDINGS Osseous: No acute fracture or mandibular dislocation. Orbits: No evidence of injury to the postseptal structures Sinuses: Left maxillary sinusitis with debris like appearance and partial mineralization. There is associated sclerotic wall thickening and bulging of the medial wall. Soft tissues: Forehead injury as above. CT CERVICAL SPINE FINDINGS Alignment: Normal. Skull base and vertebrae: No acute fracture. No primary bone lesion or focal pathologic process. Soft tissues and spinal canal:  No prevertebral fluid or swelling. No visible canal hematoma. Disc levels:  Cervical spine degeneration which is mild for age. Upper chest: No evidence of injury IMPRESSION: 1. Extensive left anterior scalp laceration without underlying fracture. 2. No evidence of intracranial or cervical spine injury. 3. Chronic  left maxillary sinusitis. Electronically Signed   By: Tiburcio Pea M.D.   On: 12/27/2022 04:46   CT CERVICAL SPINE WO CONTRAST  Result Date: 12/27/2022 CLINICAL DATA:  Fall when walking dog with forehead laceration. EXAM: CT HEAD WITHOUT CONTRAST CT MAXILLOFACIAL WITHOUT CONTRAST CT CERVICAL SPINE WITHOUT CONTRAST TECHNIQUE: Multidetector CT imaging of the head, cervical spine, and maxillofacial structures were performed using the standard protocol without intravenous contrast. Multiplanar CT image reconstructions of the cervical spine and maxillofacial structures were also generated. RADIATION DOSE REDUCTION: This exam was performed according to the departmental dose-optimization program which includes automated exposure control, adjustment of the mA and/or kV according to patient size and/or use of iterative reconstruction technique. COMPARISON:  09/19/2017 head CT FINDINGS: CT HEAD FINDINGS Brain: No evidence of acute infarction, hemorrhage, hydrocephalus, extra-axial collection or mass lesion/mass effect. Brain atrophy most pronounced in the biparietal region. Vascular: No hyperdense vessel or unexpected calcification. Skull: Deep laceration to the left forehead with undermining and flap of scalp folded laterally. Gas nearly reaches the bone. No underlying fracture or opaque foreign body. CT MAXILLOFACIAL FINDINGS Osseous: No acute fracture or mandibular dislocation. Orbits: No evidence of injury to the postseptal structures Sinuses: Left maxillary sinusitis with debris like appearance and partial mineralization. There is associated sclerotic wall thickening and bulging of the medial wall. Soft  tissues: Forehead injury as above. CT CERVICAL SPINE FINDINGS Alignment: Normal. Skull base and vertebrae: No acute fracture. No primary bone lesion or focal pathologic process. Soft tissues and spinal canal: No prevertebral fluid or swelling. No visible canal hematoma. Disc levels:  Cervical spine degeneration which is mild for age. Upper chest: No evidence of injury IMPRESSION: 1. Extensive left anterior scalp laceration without underlying fracture. 2. No evidence of intracranial or cervical spine injury. 3. Chronic left maxillary sinusitis. Electronically Signed   By: Tiburcio Pea M.D.   On: 12/27/2022 04:46    EKG: Independently reviewed. Sinus rhythm, RBBB, LVH with IVCD and repolarization abnormality.   Assessment/Plan   1. Scalp laceration  - Appreciate Dr. Harvie Junior management    2. ESRD  - Last dialyzed 12/23/22  - No indication for urgent HD  - Restrict fluids, renally-dose medications, continue calcitriol, consult nephrology in am for maintenance dialysis while hospitalized    3. COPD; chronic hypoxic respiratory failure  - Not in exacerbation  - Continue LAMA-LABA, as-needed albuterol, and supplemental O2 while sleeping and as-needed   4. Depression, anxiety  - Continue Wellbutrin, Paxil    5. Insulin-dependent DM  - A1c was 11.9% in October 2024  - On insulin pump at home, using 42 units/day on avg  - Serum glucose 360 in ED  - She is currently NPO  - Start with 15 units long-acting and sliding-scale correction every 4 hrs, adjust as needed    6. Hx of DVT  - Has been taking Eliquis only once daily, last dose AM of 12/26/22  - Hold Eliquis    7. Essential thrombocytosis  - Check CBC, hold Hydrea for now and trend CBCs given the significant bleeding    8. Hx of CVA  - Continue Lipitor, hold Eliquis     DVT prophylaxis: SCDs  Code Status: Full  Level of Care: Level of care: Telemetry Medical Family Communication: Husband and daughter at bedside  Disposition  Plan:  Patient is from: home  Anticipated d/c is to: TBD Anticipated d/c date is: 11/19 or 12/29/22  Patient currently: Pending ENT consultation and laceration repair, stable H&H, may  need dialysis in hospital  Consults called: ENT  Admission status: Observation     Briscoe Deutscher, MD [facility]  12/27/2022, 5:56 AM

## 2022-12-28 ENCOUNTER — Encounter: Payer: Self-pay | Admitting: Oncology

## 2022-12-28 ENCOUNTER — Encounter (HOSPITAL_COMMUNITY): Payer: Self-pay | Admitting: Otolaryngology

## 2022-12-28 ENCOUNTER — Other Ambulatory Visit (HOSPITAL_COMMUNITY): Payer: Self-pay

## 2022-12-28 DIAGNOSIS — E569 Vitamin deficiency, unspecified: Secondary | ICD-10-CM | POA: Diagnosis not present

## 2022-12-28 DIAGNOSIS — F411 Generalized anxiety disorder: Secondary | ICD-10-CM | POA: Diagnosis present

## 2022-12-28 DIAGNOSIS — N186 End stage renal disease: Secondary | ICD-10-CM | POA: Diagnosis present

## 2022-12-28 DIAGNOSIS — D473 Essential (hemorrhagic) thrombocythemia: Secondary | ICD-10-CM | POA: Diagnosis present

## 2022-12-28 DIAGNOSIS — I10 Essential (primary) hypertension: Secondary | ICD-10-CM | POA: Diagnosis not present

## 2022-12-28 DIAGNOSIS — Y92010 Kitchen of single-family (private) house as the place of occurrence of the external cause: Secondary | ICD-10-CM | POA: Diagnosis not present

## 2022-12-28 DIAGNOSIS — S01112A Laceration without foreign body of left eyelid and periocular area, initial encounter: Secondary | ICD-10-CM | POA: Diagnosis present

## 2022-12-28 DIAGNOSIS — W19XXXA Unspecified fall, initial encounter: Secondary | ICD-10-CM | POA: Diagnosis not present

## 2022-12-28 DIAGNOSIS — S81012A Laceration without foreign body, left knee, initial encounter: Secondary | ICD-10-CM | POA: Diagnosis present

## 2022-12-28 DIAGNOSIS — F330 Major depressive disorder, recurrent, mild: Secondary | ICD-10-CM | POA: Diagnosis present

## 2022-12-28 DIAGNOSIS — S0101XD Laceration without foreign body of scalp, subsequent encounter: Secondary | ICD-10-CM | POA: Diagnosis not present

## 2022-12-28 DIAGNOSIS — R2681 Unsteadiness on feet: Secondary | ICD-10-CM | POA: Diagnosis not present

## 2022-12-28 DIAGNOSIS — F3289 Other specified depressive episodes: Secondary | ICD-10-CM | POA: Diagnosis not present

## 2022-12-28 DIAGNOSIS — D631 Anemia in chronic kidney disease: Secondary | ICD-10-CM | POA: Diagnosis present

## 2022-12-28 DIAGNOSIS — I132 Hypertensive heart and chronic kidney disease with heart failure and with stage 5 chronic kidney disease, or end stage renal disease: Secondary | ICD-10-CM | POA: Diagnosis present

## 2022-12-28 DIAGNOSIS — I428 Other cardiomyopathies: Secondary | ICD-10-CM | POA: Diagnosis present

## 2022-12-28 DIAGNOSIS — R1311 Dysphagia, oral phase: Secondary | ICD-10-CM | POA: Diagnosis not present

## 2022-12-28 DIAGNOSIS — Z7901 Long term (current) use of anticoagulants: Secondary | ICD-10-CM | POA: Diagnosis not present

## 2022-12-28 DIAGNOSIS — Y9389 Activity, other specified: Secondary | ICD-10-CM | POA: Diagnosis not present

## 2022-12-28 DIAGNOSIS — S0101XA Laceration without foreign body of scalp, initial encounter: Secondary | ICD-10-CM | POA: Diagnosis not present

## 2022-12-28 DIAGNOSIS — I5022 Chronic systolic (congestive) heart failure: Secondary | ICD-10-CM | POA: Diagnosis present

## 2022-12-28 DIAGNOSIS — T85614A Breakdown (mechanical) of insulin pump, initial encounter: Secondary | ICD-10-CM | POA: Diagnosis present

## 2022-12-28 DIAGNOSIS — S0181XA Laceration without foreign body of other part of head, initial encounter: Secondary | ICD-10-CM | POA: Diagnosis present

## 2022-12-28 DIAGNOSIS — N250 Renal osteodystrophy: Secondary | ICD-10-CM | POA: Diagnosis not present

## 2022-12-28 DIAGNOSIS — M6259 Muscle wasting and atrophy, not elsewhere classified, multiple sites: Secondary | ICD-10-CM | POA: Diagnosis not present

## 2022-12-28 DIAGNOSIS — Z9981 Dependence on supplemental oxygen: Secondary | ICD-10-CM | POA: Diagnosis not present

## 2022-12-28 DIAGNOSIS — J449 Chronic obstructive pulmonary disease, unspecified: Secondary | ICD-10-CM | POA: Diagnosis present

## 2022-12-28 DIAGNOSIS — E1151 Type 2 diabetes mellitus with diabetic peripheral angiopathy without gangrene: Secondary | ICD-10-CM | POA: Diagnosis present

## 2022-12-28 DIAGNOSIS — M810 Age-related osteoporosis without current pathological fracture: Secondary | ICD-10-CM | POA: Diagnosis not present

## 2022-12-28 DIAGNOSIS — R296 Repeated falls: Secondary | ICD-10-CM | POA: Diagnosis present

## 2022-12-28 DIAGNOSIS — E785 Hyperlipidemia, unspecified: Secondary | ICD-10-CM | POA: Diagnosis not present

## 2022-12-28 DIAGNOSIS — I5033 Acute on chronic diastolic (congestive) heart failure: Secondary | ICD-10-CM | POA: Diagnosis not present

## 2022-12-28 DIAGNOSIS — E1140 Type 2 diabetes mellitus with diabetic neuropathy, unspecified: Secondary | ICD-10-CM | POA: Diagnosis not present

## 2022-12-28 DIAGNOSIS — Z992 Dependence on renal dialysis: Secondary | ICD-10-CM | POA: Diagnosis not present

## 2022-12-28 DIAGNOSIS — Z8673 Personal history of transient ischemic attack (TIA), and cerebral infarction without residual deficits: Secondary | ICD-10-CM | POA: Diagnosis not present

## 2022-12-28 DIAGNOSIS — W010XXA Fall on same level from slipping, tripping and stumbling without subsequent striking against object, initial encounter: Secondary | ICD-10-CM | POA: Diagnosis present

## 2022-12-28 DIAGNOSIS — R41841 Cognitive communication deficit: Secondary | ICD-10-CM | POA: Diagnosis not present

## 2022-12-28 DIAGNOSIS — E1122 Type 2 diabetes mellitus with diabetic chronic kidney disease: Secondary | ICD-10-CM | POA: Diagnosis present

## 2022-12-28 DIAGNOSIS — D509 Iron deficiency anemia, unspecified: Secondary | ICD-10-CM | POA: Diagnosis not present

## 2022-12-28 DIAGNOSIS — I739 Peripheral vascular disease, unspecified: Secondary | ICD-10-CM | POA: Diagnosis not present

## 2022-12-28 DIAGNOSIS — Z794 Long term (current) use of insulin: Secondary | ICD-10-CM | POA: Diagnosis not present

## 2022-12-28 DIAGNOSIS — Z86718 Personal history of other venous thrombosis and embolism: Secondary | ICD-10-CM | POA: Diagnosis not present

## 2022-12-28 DIAGNOSIS — I120 Hypertensive chronic kidney disease with stage 5 chronic kidney disease or end stage renal disease: Secondary | ICD-10-CM | POA: Diagnosis not present

## 2022-12-28 DIAGNOSIS — Z9641 Presence of insulin pump (external) (internal): Secondary | ICD-10-CM | POA: Diagnosis present

## 2022-12-28 DIAGNOSIS — E1165 Type 2 diabetes mellitus with hyperglycemia: Secondary | ICD-10-CM | POA: Diagnosis present

## 2022-12-28 DIAGNOSIS — M488X2 Other specified spondylopathies, cervical region: Secondary | ICD-10-CM | POA: Diagnosis not present

## 2022-12-28 DIAGNOSIS — Z91158 Patient's noncompliance with renal dialysis for other reason: Secondary | ICD-10-CM | POA: Diagnosis not present

## 2022-12-28 DIAGNOSIS — J9611 Chronic respiratory failure with hypoxia: Secondary | ICD-10-CM | POA: Diagnosis present

## 2022-12-28 LAB — COMPREHENSIVE METABOLIC PANEL
ALT: 9 U/L (ref 0–44)
AST: 11 U/L — ABNORMAL LOW (ref 15–41)
Albumin: 2.7 g/dL — ABNORMAL LOW (ref 3.5–5.0)
Alkaline Phosphatase: 72 U/L (ref 38–126)
Anion gap: 8 (ref 5–15)
BUN: 27 mg/dL — ABNORMAL HIGH (ref 8–23)
CO2: 26 mmol/L (ref 22–32)
Calcium: 9.2 mg/dL (ref 8.9–10.3)
Chloride: 102 mmol/L (ref 98–111)
Creatinine, Ser: 4.8 mg/dL — ABNORMAL HIGH (ref 0.44–1.00)
GFR, Estimated: 9 mL/min — ABNORMAL LOW (ref >60)
Glucose, Bld: 284 mg/dL — ABNORMAL HIGH (ref 70–99)
Potassium: 4 mmol/L (ref 3.5–5.1)
Sodium: 136 mmol/L (ref 135–145)
Total Bilirubin: 0.2 mg/dL (ref <1.2)
Total Protein: 5.7 g/dL — ABNORMAL LOW (ref 6.5–8.1)

## 2022-12-28 LAB — CBC
HCT: 28.6 % — ABNORMAL LOW (ref 36.0–46.0)
Hemoglobin: 8.4 g/dL — ABNORMAL LOW (ref 12.0–15.0)
MCH: 26.8 pg (ref 26.0–34.0)
MCHC: 29.4 g/dL — ABNORMAL LOW (ref 30.0–36.0)
MCV: 91.4 fL (ref 80.0–100.0)
Platelets: 455 10*3/uL — ABNORMAL HIGH (ref 150–400)
RBC: 3.13 MIL/uL — ABNORMAL LOW (ref 3.87–5.11)
RDW: 18.7 % — ABNORMAL HIGH (ref 11.5–15.5)
WBC: 14.8 10*3/uL — ABNORMAL HIGH (ref 4.0–10.5)
nRBC: 0 % (ref 0.0–0.2)

## 2022-12-28 LAB — HEPATITIS B SURFACE ANTIBODY, QUANTITATIVE: Hep B S AB Quant (Post): 144 m[IU]/mL

## 2022-12-28 LAB — MAGNESIUM: Magnesium: 1.8 mg/dL (ref 1.7–2.4)

## 2022-12-28 LAB — GLUCOSE, CAPILLARY
Glucose-Capillary: 165 mg/dL — ABNORMAL HIGH (ref 70–99)
Glucose-Capillary: 173 mg/dL — ABNORMAL HIGH (ref 70–99)
Glucose-Capillary: 193 mg/dL — ABNORMAL HIGH (ref 70–99)
Glucose-Capillary: 204 mg/dL — ABNORMAL HIGH (ref 70–99)
Glucose-Capillary: 255 mg/dL — ABNORMAL HIGH (ref 70–99)
Glucose-Capillary: 301 mg/dL — ABNORMAL HIGH (ref 70–99)

## 2022-12-28 MED ORDER — RENA-VITE PO TABS
1.0000 | ORAL_TABLET | Freq: Every day | ORAL | Status: DC
  Administered 2022-12-28 – 2022-12-31 (×3): 1 via ORAL
  Filled 2022-12-28 (×4): qty 1

## 2022-12-28 MED ORDER — CEPHALEXIN 500 MG PO CAPS
500.0000 mg | ORAL_CAPSULE | Freq: Two times a day (BID) | ORAL | 0 refills | Status: AC
  Filled 2022-12-28: qty 10, 5d supply, fill #0

## 2022-12-28 MED ORDER — VITAMIN C 500 MG PO TABS
250.0000 mg | ORAL_TABLET | Freq: Every day | ORAL | Status: DC
  Administered 2022-12-29 – 2022-12-31 (×3): 250 mg via ORAL
  Filled 2022-12-28: qty 0.5
  Filled 2022-12-28 (×3): qty 1

## 2022-12-28 MED ORDER — CEPHALEXIN 500 MG PO CAPS
500.0000 mg | ORAL_CAPSULE | Freq: Two times a day (BID) | ORAL | Status: DC
  Administered 2022-12-28 – 2022-12-31 (×7): 500 mg via ORAL
  Filled 2022-12-28 (×8): qty 1

## 2022-12-28 MED ORDER — PROSOURCE PLUS PO LIQD
30.0000 mL | Freq: Two times a day (BID) | ORAL | Status: DC
  Administered 2022-12-28 – 2022-12-31 (×3): 30 mL via ORAL
  Filled 2022-12-28 (×5): qty 30

## 2022-12-28 MED ORDER — OXYCODONE HCL 5 MG PO TABS
5.0000 mg | ORAL_TABLET | Freq: Four times a day (QID) | ORAL | 0 refills | Status: DC | PRN
  Filled 2022-12-28: qty 20, 5d supply, fill #0

## 2022-12-28 NOTE — Progress Notes (Signed)
[facility] Progress Note   Subjective: Seen prior to HD. Initial O2 sats low -85%, started on O2 @ 3 L/M. VERY talkative. No pain meds given during night! Giving her pain meds despite being on HD. BP may fall but she is in pain. Use albumin if needed.   Objective Vitals:   12/28/22 0758 12/28/22 0920 12/28/22 0929 12/28/22 0932  BP: (!) 141/89  (!) 127/52 (!) 135/54  Pulse: 84  77 78  Resp: 19  (!) 24 (!) 24  Temp: 97.8 F (36.6 C)  98.8 F (37.1 C)   TempSrc:      SpO2: 91%  95% 95%  Weight:  (S) 89.8 kg    Height:       Physical Exam General: Chronically ill appearing patient in pain at present HEENT: ACE wrap around head. Bruising noted L eye.  Heart: SR on Monitor. Rate 70-80s. S1.S2 no M/R/G Lungs: CTAB RR 24 shallow.  Abdomen:NABS, NT Extremities:Trace LE edema Dialysis Access: L AVF+ T/B   Additional Objective Labs: Basic Metabolic Panel: Recent Labs  Lab 12/27/22 0412 12/27/22 0421 12/28/22 0516  NA 134* 137 136  K 3.8 3.9 4.0  CL 98 99 102  CO2 23  --  26  GLUCOSE 360* 362* 284*  BUN 23 27* 27*  CREATININE 4.21* 4.40* 4.80*  CALCIUM 9.6  --  9.2   Liver Function Tests: Recent Labs  Lab 12/27/22 0412 12/28/22 0516  AST 13* 11*  ALT 12 9  ALKPHOS 83 72  BILITOT 0.5 0.2  PROT 6.1* 5.7*  ALBUMIN 2.9* 2.7*   No results for input(s): "LIPASE", "AMYLASE" in the last 168 hours. CBC: Recent Labs  Lab 12/27/22 0412 12/27/22 0421 12/28/22 0516  WBC 11.6*  --  14.8*  HGB 10.5* 11.9* 8.4*  HCT 35.6* 35.0* 28.6*  MCV 91.8  --  91.4  PLT 488*  --  455*   Blood Culture No results found for: "SDES", "SPECREQUEST", "CULT", "REPTSTATUS"  Cardiac Enzymes: No results for input(s): "CKTOTAL", "CKMB", "CKMBINDEX", "TROPONINI" in the last 168 hours. CBG: Recent Labs  Lab 12/27/22 1643 12/27/22 2059 12/28/22 0014 12/28/22 0448 12/28/22 0754  GLUCAP 293* 194* 204* 255* 301*   Iron Studies: No results for input(s): "IRON", "TIBC",  "TRANSFERRIN", "FERRITIN" in the last 72 hours. @lablastinr3 @ Studies/Results: CT HEAD WO CONTRAST  Result Date: 12/27/2022 CLINICAL DATA:  Fall when walking dog with forehead laceration. EXAM: CT HEAD WITHOUT CONTRAST CT MAXILLOFACIAL WITHOUT CONTRAST CT CERVICAL SPINE WITHOUT CONTRAST TECHNIQUE: Multidetector CT imaging of the head, cervical spine, and maxillofacial structures were performed using the standard protocol without intravenous contrast. Multiplanar CT image reconstructions of the cervical spine and maxillofacial structures were also generated. RADIATION DOSE REDUCTION: This exam was performed according to the departmental dose-optimization program which includes automated exposure control, adjustment of the mA and/or kV according to patient size and/or use of iterative reconstruction technique. COMPARISON:  09/19/2017 head CT FINDINGS: CT HEAD FINDINGS Brain: No evidence of acute infarction, hemorrhage, hydrocephalus, extra-axial collection or mass lesion/mass effect. Brain atrophy most pronounced in the biparietal region. Vascular: No hyperdense vessel or unexpected calcification. Skull: Deep laceration to the left forehead with undermining and flap of scalp folded laterally. Gas nearly reaches the bone. No underlying fracture or opaque foreign body. CT MAXILLOFACIAL FINDINGS Osseous: No acute fracture or mandibular dislocation. Orbits: No evidence of injury to the postseptal structures Sinuses: Left maxillary sinusitis with debris like appearance and partial mineralization. There is associated sclerotic wall thickening  and bulging of the medial wall. Soft tissues: Forehead injury as above. CT CERVICAL SPINE FINDINGS Alignment: Normal. Skull base and vertebrae: No acute fracture. No primary bone lesion or focal pathologic process. Soft tissues and spinal canal: No prevertebral fluid or swelling. No visible canal hematoma. Disc levels:  Cervical spine degeneration which is mild for age. Upper  chest: No evidence of injury IMPRESSION: 1. Extensive left anterior scalp laceration without underlying fracture. 2. No evidence of intracranial or cervical spine injury. 3. Chronic left maxillary sinusitis. Electronically Signed   By: Tiburcio Pea M.D.   On: 12/27/2022 04:46   CT MAXILLOFACIAL WO CONTRAST  Result Date: 12/27/2022 CLINICAL DATA:  Fall when walking dog with forehead laceration. EXAM: CT HEAD WITHOUT CONTRAST CT MAXILLOFACIAL WITHOUT CONTRAST CT CERVICAL SPINE WITHOUT CONTRAST TECHNIQUE: Multidetector CT imaging of the head, cervical spine, and maxillofacial structures were performed using the standard protocol without intravenous contrast. Multiplanar CT image reconstructions of the cervical spine and maxillofacial structures were also generated. RADIATION DOSE REDUCTION: This exam was performed according to the departmental dose-optimization program which includes automated exposure control, adjustment of the mA and/or kV according to patient size and/or use of iterative reconstruction technique. COMPARISON:  09/19/2017 head CT FINDINGS: CT HEAD FINDINGS Brain: No evidence of acute infarction, hemorrhage, hydrocephalus, extra-axial collection or mass lesion/mass effect. Brain atrophy most pronounced in the biparietal region. Vascular: No hyperdense vessel or unexpected calcification. Skull: Deep laceration to the left forehead with undermining and flap of scalp folded laterally. Gas nearly reaches the bone. No underlying fracture or opaque foreign body. CT MAXILLOFACIAL FINDINGS Osseous: No acute fracture or mandibular dislocation. Orbits: No evidence of injury to the postseptal structures Sinuses: Left maxillary sinusitis with debris like appearance and partial mineralization. There is associated sclerotic wall thickening and bulging of the medial wall. Soft tissues: Forehead injury as above. CT CERVICAL SPINE FINDINGS Alignment: Normal. Skull base and vertebrae: No acute fracture. No  primary bone lesion or focal pathologic process. Soft tissues and spinal canal: No prevertebral fluid or swelling. No visible canal hematoma. Disc levels:  Cervical spine degeneration which is mild for age. Upper chest: No evidence of injury IMPRESSION: 1. Extensive left anterior scalp laceration without underlying fracture. 2. No evidence of intracranial or cervical spine injury. 3. Chronic left maxillary sinusitis. Electronically Signed   By: Tiburcio Pea M.D.   On: 12/27/2022 04:46   CT CERVICAL SPINE WO CONTRAST  Result Date: 12/27/2022 CLINICAL DATA:  Fall when walking dog with forehead laceration. EXAM: CT HEAD WITHOUT CONTRAST CT MAXILLOFACIAL WITHOUT CONTRAST CT CERVICAL SPINE WITHOUT CONTRAST TECHNIQUE: Multidetector CT imaging of the head, cervical spine, and maxillofacial structures were performed using the standard protocol without intravenous contrast. Multiplanar CT image reconstructions of the cervical spine and maxillofacial structures were also generated. RADIATION DOSE REDUCTION: This exam was performed according to the departmental dose-optimization program which includes automated exposure control, adjustment of the mA and/or kV according to patient size and/or use of iterative reconstruction technique. COMPARISON:  09/19/2017 head CT FINDINGS: CT HEAD FINDINGS Brain: No evidence of acute infarction, hemorrhage, hydrocephalus, extra-axial collection or mass lesion/mass effect. Brain atrophy most pronounced in the biparietal region. Vascular: No hyperdense vessel or unexpected calcification. Skull: Deep laceration to the left forehead with undermining and flap of scalp folded laterally. Gas nearly reaches the bone. No underlying fracture or opaque foreign body. CT MAXILLOFACIAL FINDINGS Osseous: No acute fracture or mandibular dislocation. Orbits: No evidence of injury to the postseptal structures Sinuses: Left  maxillary sinusitis with debris like appearance and partial mineralization.  There is associated sclerotic wall thickening and bulging of the medial wall. Soft tissues: Forehead injury as above. CT CERVICAL SPINE FINDINGS Alignment: Normal. Skull base and vertebrae: No acute fracture. No primary bone lesion or focal pathologic process. Soft tissues and spinal canal: No prevertebral fluid or swelling. No visible canal hematoma. Disc levels:  Cervical spine degeneration which is mild for age. Upper chest: No evidence of injury IMPRESSION: 1. Extensive left anterior scalp laceration without underlying fracture. 2. No evidence of intracranial or cervical spine injury. 3. Chronic left maxillary sinusitis. Electronically Signed   By: Tiburcio Pea M.D.   On: 12/27/2022 04:46   Medications:   arformoterol  15 mcg Nebulization BID   And   umeclidinium bromide  1 puff Inhalation Daily   atorvastatin  40 mg Oral Daily   buPROPion  150 mg Oral BID   calcitRIOL  0.25 mcg Oral Daily   Chlorhexidine Gluconate Cloth  6 each Topical Q0600   gabapentin  300 mg Oral Daily   insulin aspart  0-6 Units Subcutaneous TID WC   insulin aspart  10 Units Subcutaneous TID WC   insulin glargine-yfgn  25 Units Subcutaneous Daily   PARoxetine  40 mg Oral Daily   sodium chloride flush  3 mL Intravenous Q12H     OP HD:  [facility]  TTS  4h  88kg   LUA AVF   Heparin 1500 bolus x 1      Na 134  K 3.8  BUN 23   creat 4.21   Ca 9.6  alb 2.9   Hb 10.5  wbc 11K      Assessment/ Plan: SP fall - w/ traumatic laceration to forehead, skin tears on the arms. Went to OR 11/18 for complex facial laceration degloving repair per ENT.  ESKD - on HD TTS. Missed last HD on 11/16, last HD was 11/14. HD today. Next HD 12/30/2022 HTN - BP's stable here, not on BP lowering meds at home if PTA meds are accurate. BP appears volume controlled.  Volume - no gross volume overload. 1kg up by wts. No gross vol overload on exam. UF 2-2.5 L w/ HD today.  Anemia of eskd - Hb 10-12 here, follow.  MBD ckd -  CCa in range. Add on phos. Follow.  Nutrition- Low albumin. Renal diet with protein supplements. Renal vitamin. Vitamin C for wound healing.     Emunah Texidor H. Tewana Bohlen NP-C 12/28/2022, 9:48 AM  [employer] [employer] [phone]

## 2022-12-28 NOTE — Inpatient Diabetes Management (Signed)
Inpatient Diabetes Program Recommendations  AACE/ADA: New Consensus Statement on Inpatient Glycemic Control (2015)  Target Ranges:  Prepandial:   less than 140 mg/dL      Peak postprandial:   less than 180 mg/dL (1-2 hours)      Critically ill patients:  140 - 180 mg/dL   Lab Results  Component Value Date   GLUCAP 301 (H) 12/28/2022   HGBA1C 7.8 04/22/2022    Review of Glycemic Control  Latest Reference Range & Units 12/27/22 08:04 12/27/22 10:25 12/27/22 16:43 12/27/22 20:59 12/28/22 00:14 12/28/22 04:48 12/28/22 07:54  Glucose-Capillary 70 - 99 mg/dL 469 (H) 629 (H) 528 (H) 194 (H) 204 (H) 255 (H) 301 (H)  (H): Data is abnormally high  Outpatient Diabetes medications:  Omnipod Dash with Novolog and Freestyle Libre 2 CGM Basal-12a-1.4/hr, 3a-0.8/hr, 12p-1.4/hr (total 30) ICR-1 unit for every 5 carbs ISF-1 unit drops her 20 mg/dL Target glucose 413 mg/dL Trulicity 4.5 mg weekly   Current orders for Inpatient glycemic control:  Semglee 25 units every day  Novolog 0-6 units TID Novolog 10 units TID Decadron in OR   Inpatient Diabetes Program Recommendations:    Might consider:  Semglee 30 units every day (home dose)  Will continue to follow while inpatient.  Thank you, Dulce Sellar, MSN, CDCES Diabetes Coordinator Inpatient Diabetes Program [phone] (team pager from 8a-5p)

## 2022-12-28 NOTE — Evaluation (Addendum)
Occupational Therapy Evaluation Patient Details Name: Jocelyn Wells MRN: [id number] DOB: 1955/10/22 Today's Date: 12/28/2022   History of Present Illness Patient is 67 y.o. female presented to the hospital after a fall at home and scalp laceration. Pt reports recurrent falls as she has stopped using a RW. PMH significant for IDDM, COPD with nocturnal oxygen, thrombocytosis, ESRD on HD.   Clinical Impression   Pt c/o 5/10 pain to head, feeling good otherwise. Noticeable blue lips upon entry, O2 at 86 with good pleth, applied O2 at 2L [place] improved to 95 after 5 minutes, stable throughout session with consistent O2.  Pt son and husband present during session. PT join session ~20 mins into session. Pt lives at home with husband who works part time at night 5X/wk, daughter in law lives across the street and can assist occasionally. Pt PLOF uses RW around home, rollator in community, ind with ADLs, husband helps with cooking/cleaning and driving. Pt has history of frequent falls, doesn't use RW consistently. Pt states she is close to legally blind and hopeful to have cataract surgery.  Currently, Pt mod A for bed mobility, first transferred at mod A, later improved to min A/CGA for step pivot transfers. Pt set up/min A for ADLs while sitting. Due to inconsistent support at home, history of frequent falls, Pt would benefit from postacute rehab, <3hrs/day to improve to safe functional level prior to returning home, will continue to follow acutely.       If plan is discharge home, recommend the following: A little help with walking and/or transfers;A little help with bathing/dressing/bathroom;Assistance with cooking/housework;Assist for transportation;Help with stairs or ramp for entrance    Functional Status Assessment  Patient has had a recent decline in their functional status and demonstrates the ability to make significant improvements in function in a reasonable and predictable amount of time.   Equipment Recommendations  BSC/3in1    Recommendations for Other Services       Precautions / Restrictions Precautions Precautions: Fall Restrictions Weight Bearing Restrictions: No      Mobility Bed Mobility Overal bed mobility: Needs Assistance Bed Mobility: Supine to Sit     Supine to sit: Min assist     General bed mobility comments: min A for scooting to EOB    Transfers Overall transfer level: Needs assistance Equipment used: Rolling walker (2 wheels) Transfers: Sit to/from Stand, Bed to chair/wheelchair/BSC Sit to Stand: Min assist     Step pivot transfers: Mod assist     General transfer comment: Pt min-mod for STS and steps with transfer      Balance Overall balance assessment: Needs assistance Sitting-balance support: No upper extremity supported, Feet supported Sitting balance-Leahy Scale: Good Sitting balance - Comments: EOB ADLs   Standing balance support: Bilateral upper extremity supported, During functional activity, Reliant on assistive device for balance Standing balance-Leahy Scale: Poor Standing balance comment: reliant on RW or x2 HHA                           ADL either performed or assessed with clinical judgement   ADL Overall ADL's : Needs assistance/impaired Eating/Feeding: Set up;Sitting   Grooming: Set up;Sitting   Upper Body Bathing: Minimal assistance;Sitting   Lower Body Bathing: Minimal assistance;Sitting/lateral leans   Upper Body Dressing : Set up;Sitting   Lower Body Dressing: Minimal assistance;Sit to/from stand   Toilet Transfer: Minimal assistance;Stand-pivot;Rolling walker (2 wheels);BSC/3in1   Toileting- Clothing Manipulation and Hygiene: Minimal assistance;Sitting/lateral  lean         General ADL Comments: Decreased vision and balance, needs set up to min A for ADLs, min A for standing balance with RW     Vision Baseline Vision/History: 4 Cataracts;1 Wears glasses Ability to See in  Adequate Light: 3 Highly impaired Patient Visual Report: Other (comment) (L eye swelling)       Perception         Praxis         Pertinent Vitals/Pain Pain Assessment Pain Assessment: 0-10 Pain Score: 5  Pain Location: head Pain Descriptors / Indicators: Aching, Constant Pain Intervention(s): Monitored during session     Extremity/Trunk Assessment Upper Extremity Assessment Upper Extremity Assessment: LUE deficits/detail;RUE deficits/detail RUE Deficits / Details: decreased FM skills, overall good strength/ROM RUE Sensation: WNL RUE Coordination: decreased fine motor LUE Deficits / Details: L 3rd digit pain, minor swelling at PIP, full AROM, states it hurts since fall. Decreased FM skills LUE Sensation: WNL LUE Coordination: decreased fine motor           Communication Communication Communication: No apparent difficulties   Cognition Arousal: Alert Behavior During Therapy: WFL for tasks assessed/performed Overall Cognitive Status: Within Functional Limits for tasks assessed                                 General Comments: some decreased safety awareness, infrequent use of RW increasing falls at home, not aware of needs for safety/mobility     General Comments  noticable blue lips upon entry, O2 at 86 with good pleth at rest, improved to 95 with 2L O2 [place] throughout session, remained stable.    Exercises     Shoulder Instructions      Home Living Family/patient expects to be discharged to:: Private residence Living Arrangements: Spouse/significant other Available Help at Discharge: Family Type of Home: House Home Access: Stairs to enter Secretary/administrator of Steps: 4 Entrance Stairs-Rails: Right;Left;Can reach both Home Layout: One level     Bathroom Shower/Tub: Producer, television/film/video: Handicapped height     Home Equipment: Grab bars - tub/shower;Grab bars - toilet;Toilet riser;Rolling Walker (2 wheels);Rollator (4  wheels);Wheelchair - manual   Additional Comments: Pt lives with husband, works part time second shift, daughter lives across street and can help occasionally.      Prior Functioning/Environment Prior Level of Function : Needs assist;History of Falls (last six months)             Mobility Comments: inconsistent use of rollator, 20 falls in the last year, ADLs Comments: mod I for ADLs, husband helps with cooking and cleaning        OT Problem List: Decreased strength;Decreased activity tolerance;Impaired balance (sitting and/or standing);Impaired vision/perception;Decreased coordination;Decreased safety awareness;Decreased cognition;Decreased knowledge of use of DME or AE;Pain;Impaired UE functional use      OT Treatment/Interventions: Self-care/ADL training;Therapeutic exercise;Energy conservation;DME and/or AE instruction;Therapeutic activities;Patient/family education;Balance training    OT Goals(Current goals can be found in the care plan section) Acute Rehab OT Goals Patient Stated Goal: to return home OT Goal Formulation: With patient/family Time For Goal Achievement: 01/11/23 Potential to Achieve Goals: Good  OT Frequency: Min 1X/week    Co-evaluation PT/OT/SLP Co-Evaluation/Treatment: Yes Reason for Co-Treatment: For patient/therapist safety;To address functional/ADL transfers PT goals addressed during session: Balance;Mobility/safety with mobility;Proper use of DME OT goals addressed during session: ADL's and self-care;Proper use of Adaptive equipment and DME  AM-PAC OT "6 Clicks" Daily Activity     Outcome Measure Help from another person eating meals?: A Little Help from another person taking care of personal grooming?: A Little Help from another person toileting, which includes using toliet, bedpan, or urinal?: A Little Help from another person bathing (including washing, rinsing, drying)?: A Little Help from another person to put on and taking off regular  upper body clothing?: A Little Help from another person to put on and taking off regular lower body clothing?: A Little 6 Click Score: 18   End of Session Equipment Utilized During Treatment: Gait belt;Rolling walker (2 wheels) Nurse Communication: Mobility status  Activity Tolerance: Patient tolerated treatment well Patient left: in chair;with call bell/phone within reach  OT Visit Diagnosis: Unsteadiness on feet (R26.81);Other abnormalities of gait and mobility (R26.89);Repeated falls (R29.6);Muscle weakness (generalized) (M62.81);History of falling (Z91.81);Pain Pain - part of body:  (head)                Time: 1440-1550 OT Time Calculation (min): 70 min Charges:  OT General Charges $OT Visit: 1 Visit OT Evaluation $OT Eval Low Complexity: 1 Low OT Treatments $Self Care/Home Management : 8-22 mins $Therapeutic Activity: 8-22 mins  Irving Bloor, OTR/L   Alexis Goodell 12/28/2022, 4:06 PM

## 2022-12-28 NOTE — Evaluation (Signed)
Physical Therapy Evaluation Patient Details Name: Jocelyn Wells MRN: [id number] DOB: 09-09-1955 Today's Date: 12/28/2022  History of Present Illness  Patient is 67 y.o. female presented to the hospital after a fall at home and scalp laceration. Pt reports recurrent falls as she has stopped using a RW. PMH significant for IDDM, COPD with nocturnal oxygen, thrombocytosis, ESRD on HD.   Clinical Impression  Jocelyn Wells is 67 y.o. female admitted with above HPI and diagnosis. Patient is currently limited by functional impairments below (see PT problem list). Patient lives with spouse and is mod ind with RW/Rolaltor at baseline however has been falling more frequently with 3 falls in the last week. Patient currently requires min assist for bed mobility and Mod Assist +2 for transfers with RW. Pt required Mod assist for pericare after toileting and is greatly limited by her visual deficits as well. Patient will benefit from continued skilled PT interventions to address impairments and progress independence with mobility, recommending inpatient follow up therapy, <3 hours/day. Acute PT will follow and progress as able.         If plan is discharge home, recommend the following: A little help with walking and/or transfers;A little help with bathing/dressing/bathroom;Assistance with cooking/housework;Direct supervision/assist for medications management;Assist for transportation;Help with stairs or ramp for entrance   Can travel by private vehicle   No    Equipment Recommendations Rolling walker (2 wheels);BSC/3in1  Recommendations for Other Services       Functional Status Assessment Patient has had a recent decline in their functional status and demonstrates the ability to make significant improvements in function in a reasonable and predictable amount of time.     Precautions / Restrictions Precautions Precautions: Fall Restrictions Weight Bearing Restrictions: No      Mobility  Bed  Mobility Overal bed mobility: Needs Assistance Bed Mobility: Supine to Sit     Supine to sit: Min assist, HOB elevated, Used rails     General bed mobility comments: cues for sequencing direction of transfer off EOB and assist to fully raise trunk. Min-mod assist with use of bed pad to scoot to EOB.    Transfers Overall transfer level: Needs assistance Equipment used: Rolling walker (2 wheels) Transfers: Sit to/from Stand, Bed to chair/wheelchair/BSC Sit to Stand: Min assist   Step pivot transfers: Mod assist, +2 safety/equipment       General transfer comment: Min assist with cues for hand placement and assist to steady for sit<>stand from EOB and recliner. Heavy Mod +2 assist for pivot to guide RW for turn bed>chair. Pt completed recliner<>BSC with mod assist +2 for safety and improved abiltiy to turn walker.    Ambulation/Gait                  Stairs            Wheelchair Mobility     Tilt Bed    Modified Rankin (Stroke Patients Only)       Balance Overall balance assessment: Needs assistance Sitting-balance support: No upper extremity supported, Feet supported Sitting balance-Leahy Scale: Good Sitting balance - Comments: EOB ADLs   Standing balance support: Bilateral upper extremity supported, During functional activity, Reliant on assistive device for balance Standing balance-Leahy Scale: Poor Standing balance comment: reliant on RW or x2 HHA                             Pertinent Vitals/Pain Pain Assessment Pain Assessment: 0-10 Pain  Score: 5  Pain Location: head Pain Descriptors / Indicators: Aching, Constant Pain Intervention(s): Monitored during session    Home Living Family/patient expects to be discharged to:: Private residence Living Arrangements: Spouse/significant other Available Help at Discharge: Family Type of Home: House Home Access: Stairs to enter Entrance Stairs-Rails: Right;Left;Can reach both Entrance  Stairs-Number of Steps: 4   Home Layout: One level Home Equipment: Grab bars - tub/shower;Grab bars - toilet;Toilet riser;Rolling Walker (2 wheels);Rollator (4 wheels);Wheelchair - manual Additional Comments: Pt lives with husband who works part time second shift, daughter lives across street and can help occasionally but has a small child. pt has several dogs in home that have caused her to fall.    Prior Function Prior Level of Function : Needs assist;History of Falls (last six months)             Mobility Comments: pt reports not using her RW in home always and that is when she falls. pt had at least 3 falls in last week, and son says at least 9 in last month. 2 of her falls the week of admission resulted in stitches. ADLs Comments: pt reports mod I for ADLs, husband helps with cooking and cleaning     Extremity/Trunk Assessment   Upper Extremity Assessment Upper Extremity Assessment: Defer to OT evaluation RUE Deficits / Details: decreased FM skills, overall good strength/ROM RUE Sensation: WNL RUE Coordination: decreased fine motor LUE Deficits / Details: L 3rd digit pain, minor swelling at PIP, full AROM, states it hurts since fall. Decreased FM skills LUE Sensation: WNL LUE Coordination: decreased fine motor    Lower Extremity Assessment Lower Extremity Assessment: RLE deficits/detail;LLE deficits/detail;Generalized weakness RLE Deficits / Details: Rt knee with laceration and stitches intact; pt/family reports ~ 32 week old. genearlized weakness throughout grossly 3/5. LLE Deficits / Details: Rt knee with laceration and stitches intact; pt/family reports ~ 71 week old. genearlized weakness throughout grossly 3/5.    Cervical / Trunk Assessment Cervical / Trunk Assessment: Kyphotic;Other exceptions Cervical / Trunk Exceptions: habitus  Communication   Communication Communication: No apparent difficulties  Cognition Arousal: Alert, Lethargic (fatigued/lethargic at end of  session) Behavior During Therapy: WFL for tasks assessed/performed Overall Cognitive Status: Impaired/Different from baseline Area of Impairment: Safety/judgement, Problem solving                         Safety/Judgement: Decreased awareness of deficits, Decreased awareness of safety   Problem Solving: Requires verbal cues, Difficulty sequencing, Slow processing          General Comments General comments (skin integrity, edema, etc.): Per OT pt with noticable blue lips upon entry, [place] in place however pt on RA, SpO2 at 86% with good pleth, O2 place on 2L per order in chart; SpO2 improved to 95% and remained stable throughout session.    Exercises     Assessment/Plan    PT Assessment Patient needs continued PT services  PT Problem List Decreased strength;Decreased range of motion;Decreased activity tolerance;Decreased balance;Decreased mobility;Decreased knowledge of use of DME;Decreased safety awareness;Decreased knowledge of precautions;Cardiopulmonary status limiting activity;Obesity;Decreased skin integrity       PT Treatment Interventions DME instruction;Gait training;Stair training;Functional mobility training;Therapeutic activities;Therapeutic exercise;Balance training;Neuromuscular re-education;Cognitive remediation;Patient/family education;Wheelchair mobility training    PT Goals (Current goals can be found in the Care Plan section)  Acute Rehab PT Goals Patient Stated Goal: wants to be with family at holidays PT Goal Formulation: With patient/family Time For Goal Achievement: 01/11/23 Potential to  Achieve Goals: Good    Frequency Min 1X/week     Co-evaluation PT/OT/SLP Co-Evaluation/Treatment: Yes Reason for Co-Treatment: For patient/therapist safety;To address functional/ADL transfers PT goals addressed during session: Balance;Mobility/safety with mobility;Proper use of DME OT goals addressed during session: ADL's and self-care;Proper use of Adaptive  equipment and DME       AM-PAC PT "6 Clicks" Mobility  Outcome Measure Help needed turning from your back to your side while in a flat bed without using bedrails?: A Little Help needed moving from lying on your back to sitting on the side of a flat bed without using bedrails?: A Little Help needed moving to and from a bed to a chair (including a wheelchair)?: A Lot       6 Click Score: 8    End of Session Equipment Utilized During Treatment: Gait belt;Oxygen Activity Tolerance: Patient tolerated treatment well Patient left: in chair;with call bell/phone within reach;with chair alarm set;with family/visitor present Nurse Communication: Mobility status PT Visit Diagnosis: Other abnormalities of gait and mobility (R26.89);Muscle weakness (generalized) (M62.81);Difficulty in walking, not elsewhere classified (R26.2);Pain Pain - Right/Left:  (face) Pain - part of body:  (face)    Time: 4098-1191 PT Time Calculation (min) (ACUTE ONLY): 51 min   Charges:   PT Evaluation $PT Eval Moderate Complexity: 1 Mod PT Treatments $Therapeutic Activity: 8-22 mins PT General Charges $$ ACUTE PT VISIT: 1 Visit         Wynn Maudlin, DPT Acute Rehabilitation Services Office [phone]  12/28/22 4:25 PM

## 2022-12-28 NOTE — Progress Notes (Signed)
Pt receives out-pt HD at [facility] on [facility] on TTS 10:15 chair time. Will assist as needed.   Olivia Canter Renal Navigator [phone]

## 2022-12-28 NOTE — Progress Notes (Signed)
PROGRESS NOTE  Jocelyn Wells UJW:[id number] DOB: [birth year] DOA: 12/27/2022 PCP: Jacky Kindle, FNP   LOS: 0 days   Brief Narrative / Interim history: 67 year old female with IDDM, COPD with nocturnal oxygen, thrombocytosis, ESRD on HD who comes into the hospital after a fall at home and a scalp laceration.  She has recurrent falls, denies syncope but has been consistently not using her walker which is one of the reasons for why she falls.  Recently missed dialysis due to nausea and vomiting, and husband tells me that she sometimes gets weaker if she misses dialysis and that may have played a role.  She has had a large left anterior scalp laceration, ENT was consulted and she was taken to the OR for repair   Subjective / 24h Interval events: Doing well, pain is better. Waiting to go to HD and to work with PT.  Assesement and Plan: Principal Problem:   Scalp laceration, initial encounter Active Problems:   Type 2 diabetes mellitus with other diabetic kidney complication (HCC)   Anxiety, generalized   Essential hypertension   History of stroke   COPD (chronic obstructive pulmonary disease) (HCC)   Chronic deep vein thrombosis (DVT) (HCC)   MDD (major depressive disorder), recurrent episode, mild (HCC)   ESRD on dialysis )   Essential thrombocytosis (HCC)   Principal problem Scalp laceration-Dr. Merrilee Jansky with ENT consulted, patient was taken to the OR and she is status post laceration repair on 11/18.  ENT recommends antibiotics for 5 days and keep pressure dressing in place for a week up until she is seen as an outpatient next week.  Okay per ENT to resume Eliquis. She worked with PT this afternoon, and SNF was recommended. Patient is agreeable. TOC consulted.   Active problems COPD, chronic hypoxia-no wheezing, continue home medications  Essential thrombocytosis-continue home medications on discharge   ESRD-nephrology consulted, she was dialyzed while here  History of  DVT-okay to resume Eliquis per ENT, will start tomorrow.  Prior CVA-continue Lipitor, Eliquis  Anemia due to chronic kidney disease-monitor hemoglobin  IDDM -poorly controlled, with hyperglycemia, A1c 11.9.  She is not using insulin pump at home since it broke down and they will receive a pump in 1-2 days. For now keep on glargine + SSI  CBG (last 3)  Recent Labs    12/28/22 0448 12/28/22 0754 12/28/22 1403  GLUCAP 255* 301* 193*     Scheduled Meds:  (feeding supplement) PROSource Plus  30 mL Oral BID BM   arformoterol  15 mcg Nebulization BID   And   umeclidinium bromide  1 puff Inhalation Daily   ascorbic acid  250 mg Oral Daily   atorvastatin  40 mg Oral Daily   buPROPion  150 mg Oral BID   calcitRIOL  0.25 mcg Oral Daily   cephALEXin  500 mg Oral Q12H   Chlorhexidine Gluconate Cloth  6 each Topical Q0600   gabapentin  300 mg Oral Daily   insulin aspart  0-6 Units Subcutaneous TID WC   insulin aspart  10 Units Subcutaneous TID WC   insulin glargine-yfgn  25 Units Subcutaneous Daily   multivitamin  1 tablet Oral QHS   PARoxetine  40 mg Oral Daily   sodium chloride flush  3 mL Intravenous Q12H   Continuous Infusions: PRN Meds:.acetaminophen **OR** acetaminophen, albuterol, fentaNYL (SUBLIMAZE) injection, ondansetron (ZOFRAN) IV, oxyCODONE  Current Outpatient Medications  Medication Instructions   albuterol (VENTOLIN HFA) 108 (90 Base) MCG/ACT inhaler TAKE 2 PUFFS BY  MOUTH EVERY 6 HOURS AS NEEDED FOR WHEEZE OR SHORTNESS OF BREATH   apixaban (ELIQUIS) 5 mg, Oral, 2 times daily   atorvastatin (LIPITOR) 40 mg, Oral, Daily   buPROPion (WELLBUTRIN SR) 150 mg, Oral, 2 times daily   calcitRIOL (ROCALTROL) 0.25 mcg, Oral, Daily   cephALEXin (KEFLEX) 500 mg, Oral, Every 12 hours   fluticasone (FLONASE) 50 MCG/ACT nasal spray 2 sprays, Each Nare, Daily   gabapentin (NEURONTIN) 300 mg, Oral, Daily   glucose blood test strip    hydroxyurea (HYDREA) 500 mg, Oral, Every other  day, May take with food to minimize GI side effects.   insulin aspart (NOVOLOG) 100 UNIT/ML injection Insulin pump   Insulin Disposable Pump (OMNIPOD DASH PODS, GEN 4,) MISC USE 1 POD EACH EVERY 72    HOURS   Insulin Human (INSULIN PUMP) SOLN Per endocrinoloy   loratadine (CLARITIN) 10 MG tablet 1 tablet, Oral, Daily   meclizine (ANTIVERT) 12.5 mg, Oral, 3 times daily PRN   oxyCODONE (OXY IR/ROXICODONE) 5 mg, Oral, Every 6 hours PRN   OXYGEN 2 L, Nasal, Continuous   PARoxetine (PAXIL) 40 mg, Oral, Daily   Spacer/Aero-Holding Chambers (OPTICHAMBER DIAMOND) MISC Use with inhaler to ensure medication is delivered throughout lungs   Tiotropium Bromide-Olodaterol (STIOLTO RESPIMAT) 2.5-2.5 MCG/ACT AERS 2 puffs, Inhalation, Daily   torsemide (DEMADEX) 20 MG tablet TAKE 3 TABLETS BY MOUTH EVERY DAY   traMADol (ULTRAM) 50 mg, Oral, Every 12 hours PRN   Trulicity 4.5 mg, Subcutaneous, Every 7 days   Vitamin D (Ergocalciferol) (DRISDOL) 50,000 Units, Oral, Every 7 days, Sunday     Diet Orders (From admission, onward)     Start     Ordered   12/27/22 0955  Diet renal with fluid restriction Fluid restriction: 1200 mL Fluid; Room service appropriate? Yes; Fluid consistency: Thin  Diet effective now       Question Answer Comment  Fluid restriction: 1200 mL Fluid   Room service appropriate? Yes   Fluid consistency: Thin      11 /18/24 0954            DVT prophylaxis: SCDs Start: 12/27/22 0557   Lab Results  Component Value Date   PLT 455 (H) 12/28/2022      Code Status: Full Code  Family Communication: husband and son at bedside   Status is: Observation The patient will require care spanning > 2 midnights and should be moved to inpatient because: post op, needs SNF, balance issues   Level of care: Telemetry Medical  Consultants:  ENT  Objective: Vitals:   12/28/22 1304 12/28/22 1310 12/28/22 1319 12/28/22 1407  BP: (!) 147/60 133/63 133/63 129/62  Pulse: 75 77 78 81  Resp:  (!) 30 (!) 23 17   Temp: 98 F (36.7 C)  98 F (36.7 C) 98 F (36.7 C)  TempSrc: Oral   Oral  SpO2: 94% (!) 88% 98% 97%  Weight:      Height:        Intake/Output Summary (Last 24 hours) at 12/28/2022 1447 Last data filed at 12/28/2022 1319 Gross per 24 hour  Intake 120 ml  Output 2500 ml  Net -2380 ml   Wt Readings from Last 3 Encounters:  12/28/22 (S) 89.8 kg  12/08/22 89 kg  12/06/22 91.2 kg    Examination:  Constitutional: NAD Eyes: no scleral icterus ENMT: Mucous membranes are moist.  Neck: normal, supple Respiratory: clear to auscultation bilaterally, no wheezing, no crackles. Normal respiratory effort. No accessory  muscle use.  Cardiovascular: Regular rate and rhythm, no murmurs / rubs / gallops. No LE edema.  Abdomen: non distended, no tenderness. Bowel sounds positive.  Musculoskeletal: no clubbing / cyanosis.  Skin: no rashes   Data Reviewed: I have independently reviewed following labs and imaging studies   CBC Recent Labs  Lab 12/27/22 0412 12/27/22 0421 12/28/22 0516  WBC 11.6*  --  14.8*  HGB 10.5* 11.9* 8.4*  HCT 35.6* 35.0* 28.6*  PLT 488*  --  455*  MCV 91.8  --  91.4  MCH 27.1  --  26.8  MCHC 29.5*  --  29.4*  RDW 18.2*  --  18.7*    Recent Labs  Lab 12/27/22 0412 12/27/22 0421 12/28/22 0516  NA 134* 137 136  K 3.8 3.9 4.0  CL 98 99 102  CO2 23  --  26  GLUCOSE 360* 362* 284*  BUN 23 27* 27*  CREATININE 4.21* 4.40* 4.80*  CALCIUM 9.6  --  9.2  AST 13*  --  11*  ALT 12  --  9  ALKPHOS 83  --  72  BILITOT 0.5  --  0.2  ALBUMIN 2.9*  --  2.7*  MG  --   --  1.8  INR 1.6*  --   --     ------------------------------------------------------------------------------------------------------------------ No results for input(s): "CHOL", "HDL", "LDLCALC", "TRIG", "CHOLHDL", "LDLDIRECT" in the last 72 hours.  Lab Results  Component Value Date   HGBA1C 7.8 04/22/2022    ------------------------------------------------------------------------------------------------------------------ No results for input(s): "TSH", "T4TOTAL", "T3FREE", "THYROIDAB" in the last 72 hours.  Invalid input(s): "FREET3"  Cardiac Enzymes No results for input(s): "CKMB", "TROPONINI", "MYOGLOBIN" in the last 168 hours.  Invalid input(s): "CK" ------------------------------------------------------------------------------------------------------------------    Component Value Date/Time   BNP 735.3 (H) 02/11/2022 1137    CBG: Recent Labs  Lab 12/27/22 2059 12/28/22 0014 12/28/22 0448 12/28/22 0754 12/28/22 1403  GLUCAP 194* 204* 255* 301* 193*    No results found for this or any previous visit (from the past 240 hour(s)).   Radiology Studies: No results found.   Pamella Pert, MD, PhD [facility]  Between 7 am - 7 pm I am available, please contact me via Amion (for emergencies) or Securechat (non urgent messages)  Between 7 pm - 7 am I am not available, please contact night coverage MD/APP via Amion

## 2022-12-28 NOTE — Plan of Care (Signed)

## 2022-12-28 NOTE — Progress Notes (Signed)
OT Cancellation Note  Patient Details Name: Jocelyn Wells MRN: [id number] DOB: 10/23/1955   Cancelled Treatment:    Reason Eval/Treat Not Completed: (P) Patient at procedure or test/ unavailable, will follow up as able.  Alexis Goodell 12/28/2022, 1:24 PM

## 2022-12-28 NOTE — Progress Notes (Signed)
   12/28/22 1319  Vitals  Temp 98 F (36.7 C)  Pulse Rate 78  Resp 17  BP 133/63  SpO2 98 %  O2 Device Nasal Cannula  Oxygen Therapy  O2 Flow Rate (L/min) 2 L/min  Pulse Oximetry Type Continuous  Post Treatment  Dialyzer Clearance Clear  Hemodialysis Intake (mL) 0 mL  Liters Processed 73.9  Fluid Removed (mL) 2500 mL  Tolerated HD Treatment Yes  Post-Hemodialysis Comments Pt. tolerated HD procedure without difficulties. Admin medication per order. Applied oxygen N/C per NP Manson Passey , D/T patient oxygen level was decreasing. Report Call to 55M bedside RN Rochel Brome  AVG/AVF Arterial Site Held (minutes) 5 minutes  AVG/AVF Venous Site Held (minutes) 7 minutes   Received patient in bed to unit.  Alert and oriented.  Informed consent signed and in chart.   TX duration:3.5  Patient tolerated well.  Transported back to the room  Alert, without acute distress.  Hand-off given to patient's nurse.   Access used: Yes Access issues: No  Total UF removed: 2500 Medication(s) given: See MAR Post HD VS: See Above Grid Post HD weight: 87.2 kg   [facility]

## 2022-12-28 NOTE — Progress Notes (Signed)
PT Cancellation Note  Patient Details Name: Jocelyn Wells MRN: [id number] DOB: 1955/04/20   Cancelled Treatment:    Reason Eval/Treat Not Completed: Patient at procedure or test/unavailable  Currently on her way to HD;   Will make every effort to see her for PT eval after HD;   Van Clines, PT  Acute Rehabilitation Services Office [phone] Secure Chat welcomed    Jocelyn Wells 12/28/2022, 8:30 AM

## 2022-12-28 NOTE — Progress Notes (Signed)
Raphael Gibney DO [facility] inform cardiac monitoring has expired

## 2022-12-28 NOTE — Plan of Care (Signed)

## 2022-12-29 ENCOUNTER — Other Ambulatory Visit: Payer: Self-pay | Admitting: Pharmacist

## 2022-12-29 ENCOUNTER — Ambulatory Visit: Payer: Federal, State, Local not specified - PPO | Admitting: Family Medicine

## 2022-12-29 DIAGNOSIS — S0101XA Laceration without foreign body of scalp, initial encounter: Secondary | ICD-10-CM | POA: Diagnosis not present

## 2022-12-29 LAB — BASIC METABOLIC PANEL
Anion gap: 10 (ref 5–15)
BUN: 16 mg/dL (ref 8–23)
CO2: 27 mmol/L (ref 22–32)
Calcium: 9.1 mg/dL (ref 8.9–10.3)
Chloride: 97 mmol/L — ABNORMAL LOW (ref 98–111)
Creatinine, Ser: 3.79 mg/dL — ABNORMAL HIGH (ref 0.44–1.00)
GFR, Estimated: 12 mL/min — ABNORMAL LOW (ref >60)
Glucose, Bld: 275 mg/dL — ABNORMAL HIGH (ref 70–99)
Potassium: 4.1 mmol/L (ref 3.5–5.1)
Sodium: 134 mmol/L — ABNORMAL LOW (ref 135–145)

## 2022-12-29 LAB — GLUCOSE, CAPILLARY
Glucose-Capillary: 265 mg/dL — ABNORMAL HIGH (ref 70–99)
Glucose-Capillary: 212 mg/dL — ABNORMAL HIGH (ref 70–99)
Glucose-Capillary: 255 mg/dL — ABNORMAL HIGH (ref 70–99)
Glucose-Capillary: 255 mg/dL — ABNORMAL HIGH (ref 70–99)

## 2022-12-29 LAB — CBC
HCT: 29.2 % — ABNORMAL LOW (ref 36.0–46.0)
Hemoglobin: 8.4 g/dL — ABNORMAL LOW (ref 12.0–15.0)
MCH: 26.1 pg (ref 26.0–34.0)
MCHC: 28.8 g/dL — ABNORMAL LOW (ref 30.0–36.0)
MCV: 90.7 fL (ref 80.0–100.0)
Platelets: 406 10*3/uL — ABNORMAL HIGH (ref 150–400)
RBC: 3.22 MIL/uL — ABNORMAL LOW (ref 3.87–5.11)
RDW: 18.7 % — ABNORMAL HIGH (ref 11.5–15.5)
WBC: 11.6 10*3/uL — ABNORMAL HIGH (ref 4.0–10.5)
nRBC: 0 % (ref 0.0–0.2)

## 2022-12-29 LAB — RENAL FUNCTION PANEL
Albumin: 2.8 g/dL — ABNORMAL LOW (ref 3.5–5.0)
Anion gap: 11 (ref 5–15)
BUN: 18 mg/dL (ref 8–23)
CO2: 26 mmol/L (ref 22–32)
Calcium: 9.2 mg/dL (ref 8.9–10.3)
Chloride: 98 mmol/L (ref 98–111)
Creatinine, Ser: 3.96 mg/dL — ABNORMAL HIGH (ref 0.44–1.00)
GFR, Estimated: 12 mL/min — ABNORMAL LOW (ref >60)
Glucose, Bld: 235 mg/dL — ABNORMAL HIGH (ref 70–99)
Phosphorus: 4.6 mg/dL (ref 2.5–4.6)
Potassium: 3.9 mmol/L (ref 3.5–5.1)
Sodium: 135 mmol/L (ref 135–145)

## 2022-12-29 MED ORDER — NAPHAZOLINE-GLYCERIN 0.012-0.25 % OP SOLN
2.0000 [drp] | Freq: Four times a day (QID) | OPHTHALMIC | Status: DC | PRN
  Filled 2022-12-29: qty 15

## 2022-12-29 NOTE — Progress Notes (Signed)
[facility] Progress Note   Subjective: Feel better today. Patient takes care of deaf/blind animals. Commended for her service and sacrifice to help animals. Facial swelling appears to be improving. She denies SOB.     Objective Vitals:   12/28/22 2211 12/29/22 0126 12/29/22 0658 12/29/22 0930  BP: (!) 144/58  (!) 135/120 (!) 122/47  Pulse: 76  73 75  Resp: 18  18 18   Temp: 98.3 F (36.8 C)  98.4 F (36.9 C) 97.6 F (36.4 C)  TempSrc:    Oral  SpO2: 91%  96% 95%  Weight:  89.8 kg    Height:       Physical Exam General: Chronically ill appearing patient in pain at present HEENT: ACE wrap around head. Bruising noted L eye.  Heart: SR on Monitor. Rate 70-80s. S1.S2 no M/R/G Lungs: CTAB RR 24 shallow.  Abdomen:NABS, NT Extremities:Trace LE edema Dialysis Access: L AVF+ T/B  Additional Objective Labs: Basic Metabolic Panel: Recent Labs  Lab 12/27/22 0412 12/27/22 0421 12/28/22 0516 12/29/22 0544  NA 134* 137 136 134*  K 3.8 3.9 4.0 4.1  CL 98 99 102 97*  CO2 23  --  26 27  GLUCOSE 360* 362* 284* 275*  BUN 23 27* 27* 16  CREATININE 4.21* 4.40* 4.80* 3.79*  CALCIUM 9.6  --  9.2 9.1   Liver Function Tests: Recent Labs  Lab 12/27/22 0412 12/28/22 0516  AST 13* 11*  ALT 12 9  ALKPHOS 83 72  BILITOT 0.5 0.2  PROT 6.1* 5.7*  ALBUMIN 2.9* 2.7*   No results for input(s): "LIPASE", "AMYLASE" in the last 168 hours. CBC: Recent Labs  Lab 12/27/22 0412 12/27/22 0421 12/28/22 0516 12/29/22 0544  WBC 11.6*  --  14.8* 11.6*  HGB 10.5* 11.9* 8.4* 8.4*  HCT 35.6* 35.0* 28.6* 29.2*  MCV 91.8  --  91.4 90.7  PLT 488*  --  455* 406*   Blood Culture No results found for: "SDES", "SPECREQUEST", "CULT", "REPTSTATUS"  Cardiac Enzymes: No results for input(s): "CKTOTAL", "CKMB", "CKMBINDEX", "TROPONINI" in the last 168 hours. CBG: Recent Labs  Lab 12/28/22 0754 12/28/22 1403 12/28/22 1638 12/28/22 2212 12/29/22 0803  GLUCAP 301* 193* 173* 165*  265*   Iron Studies: No results for input(s): "IRON", "TIBC", "TRANSFERRIN", "FERRITIN" in the last 72 hours. @lablastinr3 @ Studies/Results: No results found. Medications:   (feeding supplement) PROSource Plus  30 mL Oral BID BM   arformoterol  15 mcg Nebulization BID   And   umeclidinium bromide  1 puff Inhalation Daily   ascorbic acid  250 mg Oral Daily   atorvastatin  40 mg Oral Daily   buPROPion  150 mg Oral BID   calcitRIOL  0.25 mcg Oral Daily   cephALEXin  500 mg Oral Q12H   Chlorhexidine Gluconate Cloth  6 each Topical Q0600   gabapentin  300 mg Oral Daily   insulin aspart  0-6 Units Subcutaneous TID WC   insulin aspart  10 Units Subcutaneous TID WC   insulin glargine-yfgn  25 Units Subcutaneous Daily   multivitamin  1 tablet Oral QHS   PARoxetine  40 mg Oral Daily   sodium chloride flush  3 mL Intravenous Q12H     OP HD:  [facility]  TTS  4h  88kg   LUA AVF   Heparin 1500 bolus x 1      Na 134  K 3.8  BUN 23   creat 4.21   Ca 9.6  alb 2.9   Hb 10.5  wbc 11K      Assessment/ Plan: SP fall - w/ traumatic laceration to forehead, skin tears on the arms. Went to OR 11/18 for complex facial laceration degloving repair per ENT.  ESKD - on HD TTS. Missed last HD on 11/16, last HD was 11/14. HD today. Next HD 12/30/2022 HTN - BP's stable here, not on BP lowering meds at home if PTA meds are accurate. BP appears volume controlled.  Volume - no gross volume overload. 1kg up by wts. No gross vol overload on exam. UF 2-2.5 L w/ HD today.  Anemia of eskd -HGB down to 8.4 today. Check iron panel with HD tomorrow.  MBD ckd - CCa in range. Add on phos. Follow.  Nutrition- Low albumin. Renal diet with protein supplements. Renal vitamin. Vitamin C for wound healing.     Panda Crossin H. Dionne Rossa NP-C 12/29/2022, 11:52 AM  [employer] [phone]

## 2022-12-29 NOTE — Progress Notes (Signed)
PROGRESS NOTE  Jocelyn Wells:[id number] DOB: 1955/03/18 DOA: 12/27/2022 PCP: Jacky Kindle, FNP   LOS: 1 day   Brief Narrative / Interim history: 67 year old female with IDDM, COPD with nocturnal oxygen, thrombocytosis, ESRD on HD who comes into the hospital after a fall at home and a scalp laceration.  She has recurrent falls, denies syncope but has been consistently not using her walker which is one of the reasons for why she falls.  Recently missed dialysis due to nausea and vomiting, and husband tells me that she sometimes gets weaker if she misses dialysis and that may have played a role.  She has had a large left anterior scalp laceration, ENT was consulted and she was taken to the OR for repair   Subjective / 24h Interval events: She is doing well this morning, complains of a headache  Assesement and Plan: Principal Problem:   Scalp laceration, initial encounter Active Problems:   Type 2 diabetes mellitus with other diabetic kidney complication (HCC)   Anxiety, generalized   Essential hypertension   History of stroke   COPD (chronic obstructive pulmonary disease) (HCC)   Chronic deep vein thrombosis (DVT) (HCC)   MDD (major depressive disorder), recurrent episode, mild (HCC)   ESRD on dialysis [facility])   Essential thrombocytosis (HCC)   Fall   Principal problem Scalp laceration-Dr. Merrilee Jansky with ENT consulted, patient was taken to the OR and she is status post laceration repair on 11/18.  ENT recommends antibiotics for 5 days and keep pressure dressing in place for a week up until she is seen as an outpatient next week.  Okay per ENT to resume Eliquis, will resume tomorrow. She worked with PT, and SNF was recommended. Patient is agreeable. TOC consulted.  Apparently she needs to be here for 3 midnights, likely to be discharged on Friday   Active problems COPD, chronic hypoxia-no wheezing, continue home medications  Essential thrombocytosis-continue home medications on  discharge   ESRD-nephrology consulted, she was dialyzed while here  History of DVT-okay to resume Eliquis per ENT, will start tomorrow.  Prior CVA-continue Lipitor, Eliquis  Anemia due to chronic kidney disease-monitor hemoglobin  IDDM -poorly controlled, with hyperglycemia, A1c 11.9.  She is not using insulin pump at home since it broke down and they will receive a pump in 1-2 days. For now keep on glargine + SSI  CBG (last 3)  Recent Labs    12/28/22 1638 12/28/22 2212 12/29/22 0803  GLUCAP 173* 165* 265*     Scheduled Meds:  (feeding supplement) PROSource Plus  30 mL Oral BID BM   arformoterol  15 mcg Nebulization BID   And   umeclidinium bromide  1 puff Inhalation Daily   ascorbic acid  250 mg Oral Daily   atorvastatin  40 mg Oral Daily   buPROPion  150 mg Oral BID   calcitRIOL  0.25 mcg Oral Daily   cephALEXin  500 mg Oral Q12H   Chlorhexidine Gluconate Cloth  6 each Topical Q0600   gabapentin  300 mg Oral Daily   insulin aspart  0-6 Units Subcutaneous TID WC   insulin aspart  10 Units Subcutaneous TID WC   insulin glargine-yfgn  25 Units Subcutaneous Daily   multivitamin  1 tablet Oral QHS   PARoxetine  40 mg Oral Daily   sodium chloride flush  3 mL Intravenous Q12H   Continuous Infusions: PRN Meds:.acetaminophen **OR** acetaminophen, albuterol, fentaNYL (SUBLIMAZE) injection, naphazoline-glycerin, ondansetron (ZOFRAN) IV, oxyCODONE  Current Outpatient Medications  Medication Instructions   albuterol (VENTOLIN HFA) 108 (90 Base) MCG/ACT inhaler TAKE 2 PUFFS BY MOUTH EVERY 6 HOURS AS NEEDED FOR WHEEZE OR SHORTNESS OF BREATH   apixaban (ELIQUIS) 5 mg, Oral, 2 times daily   atorvastatin (LIPITOR) 40 mg, Oral, Daily   buPROPion (WELLBUTRIN SR) 150 mg, Oral, 2 times daily   calcitRIOL (ROCALTROL) 0.25 mcg, Oral, Daily   cephALEXin (KEFLEX) 500 mg, Oral, Every 12 hours   fluticasone (FLONASE) 50 MCG/ACT nasal spray 2 sprays, Each Nare, Daily   gabapentin  (NEURONTIN) 300 mg, Oral, Daily   glucose blood test strip    hydroxyurea (HYDREA) 500 mg, Oral, Every other day, May take with food to minimize GI side effects.   insulin aspart (NOVOLOG) 100 UNIT/ML injection Insulin pump   Insulin Disposable Pump (OMNIPOD DASH PODS, GEN 4,) MISC USE 1 POD EACH EVERY 72    HOURS   Insulin Human (INSULIN PUMP) SOLN Per endocrinoloy   loratadine (CLARITIN) 10 MG tablet 1 tablet, Oral, Daily   meclizine (ANTIVERT) 12.5 mg, Oral, 3 times daily PRN   oxyCODONE (OXY IR/ROXICODONE) 5 mg, Oral, Every 6 hours PRN   OXYGEN 2 L, Nasal, Continuous   PARoxetine (PAXIL) 40 mg, Oral, Daily   Spacer/Aero-Holding Chambers (OPTICHAMBER DIAMOND) MISC Use with inhaler to ensure medication is delivered throughout lungs   Tiotropium Bromide-Olodaterol (STIOLTO RESPIMAT) 2.5-2.5 MCG/ACT AERS 2 puffs, Inhalation, Daily   torsemide (DEMADEX) 20 MG tablet TAKE 3 TABLETS BY MOUTH EVERY DAY   traMADol (ULTRAM) 50 mg, Oral, Every 12 hours PRN   Trulicity 4.5 mg, Subcutaneous, Every 7 days   Vitamin D (Ergocalciferol) (DRISDOL) 50,000 Units, Oral, Every 7 days, Sunday     Diet Orders (From admission, onward)     Start     Ordered   12/27/22 0955  Diet renal with fluid restriction Fluid restriction: 1200 mL Fluid; Room service appropriate? Yes; Fluid consistency: Thin  Diet effective now       Question Answer Comment  Fluid restriction: 1200 mL Fluid   Room service appropriate? Yes   Fluid consistency: Thin      11 /18/24 0954            DVT prophylaxis: SCDs Start: 12/27/22 0557   Lab Results  Component Value Date   PLT 406 (H) 12/29/2022      Code Status: Full Code  Family Communication: husband and son at bedside   Status is: Inpatient   Level of care: Telemetry Medical  Consultants:  ENT  Objective: Vitals:   12/28/22 2211 12/29/22 0126 12/29/22 0658 12/29/22 0930  BP: (!) 144/58  (!) 135/120 (!) 122/47  Pulse: 76  73 75  Resp: 18  18 18   Temp:  98.3 F (36.8 C)  98.4 F (36.9 C) 97.6 F (36.4 C)  TempSrc:    Oral  SpO2: 91%  96% 95%  Weight:  89.8 kg    Height:        Intake/Output Summary (Last 24 hours) at 12/29/2022 1032 Last data filed at 12/29/2022 0700 Gross per 24 hour  Intake 420 ml  Output 2500 ml  Net -2080 ml   Wt Readings from Last 3 Encounters:  12/29/22 89.8 kg  12/08/22 89 kg  12/06/22 91.2 kg    Examination:  Constitutional: NAD Eyes: lids and conjunctivae normal, no scleral icterus ENMT: mmm Neck: normal, supple Respiratory: clear to auscultation bilaterally, no wheezing, no crackles. Normal respiratory effort.  Cardiovascular: Regular rate and rhythm, no murmurs /  rubs / gallops. No LE edema. Abdomen: soft, no distention, no tenderness. Bowel sounds positive.    Data Reviewed: I have independently reviewed following labs and imaging studies   CBC Recent Labs  Lab 12/27/22 0412 12/27/22 0421 12/28/22 0516 12/29/22 0544  WBC 11.6*  --  14.8* 11.6*  HGB 10.5* 11.9* 8.4* 8.4*  HCT 35.6* 35.0* 28.6* 29.2*  PLT 488*  --  455* 406*  MCV 91.8  --  91.4 90.7  MCH 27.1  --  26.8 26.1  MCHC 29.5*  --  29.4* 28.8*  RDW 18.2*  --  18.7* 18.7*    Recent Labs  Lab 12/27/22 0412 12/27/22 0421 12/28/22 0516 12/29/22 0544  NA 134* 137 136 134*  K 3.8 3.9 4.0 4.1  CL 98 99 102 97*  CO2 23  --  26 27  GLUCOSE 360* 362* 284* 275*  BUN 23 27* 27* 16  CREATININE 4.21* 4.40* 4.80* 3.79*  CALCIUM 9.6  --  9.2 9.1  AST 13*  --  11*  --   ALT 12  --  9  --   ALKPHOS 83  --  72  --   BILITOT 0.5  --  0.2  --   ALBUMIN 2.9*  --  2.7*  --   MG  --   --  1.8  --   INR 1.6*  --   --   --     ------------------------------------------------------------------------------------------------------------------ No results for input(s): "CHOL", "HDL", "LDLCALC", "TRIG", "CHOLHDL", "LDLDIRECT" in the last 72 hours.  Lab Results  Component Value Date   HGBA1C 7.8 04/22/2022    ------------------------------------------------------------------------------------------------------------------ No results for input(s): "TSH", "T4TOTAL", "T3FREE", "THYROIDAB" in the last 72 hours.  Invalid input(s): "FREET3"  Cardiac Enzymes No results for input(s): "CKMB", "TROPONINI", "MYOGLOBIN" in the last 168 hours.  Invalid input(s): "CK" ------------------------------------------------------------------------------------------------------------------    Component Value Date/Time   BNP 735.3 (H) 02/11/2022 1137    CBG: Recent Labs  Lab 12/28/22 0754 12/28/22 1403 12/28/22 1638 12/28/22 2212 12/29/22 0803  GLUCAP 301* 193* 173* 165* 265*    No results found for this or any previous visit (from the past 240 hour(s)).   Radiology Studies: No results found.   Pamella Pert, MD, PhD [facility]  Between 7 am - 7 pm I am available, please contact me via Amion (for emergencies) or Securechat (non urgent messages)  Between 7 pm - 7 am I am not available, please contact night coverage MD/APP via Amion

## 2022-12-29 NOTE — Progress Notes (Signed)
Physical Therapy Treatment Patient Details Name: Jocelyn Wells MRN: [id number] DOB: May 03, 1955 Today's Date: 12/29/2022   History of Present Illness Patient is 67 y.o. female presented to the hospital after a fall at home and scalp laceration. Pt reports recurrent falls as she has stopped using a RW. PMH significant for IDDM, COPD with nocturnal oxygen, thrombocytosis, ESRD on HD.    PT Comments  Patient resting in bed and agreeable to mobilize with therapy. Min assist for supine>sit with pt reliant on bed features to raise trunk. Min assist for power up from EOB to RW, pt required cues for hand placement. Ambulation progressed today to ~100' with standing rest break x2 during gait, pt's knees flexing as she fatigued. EOS pt agreeable to remain OOB in recliner.  Will continue to progress pt as able during acute stay. Continue to recommend continued inpatient follow up therapy, <3 hours/day.   If plan is discharge home, recommend the following: A little help with walking and/or transfers;A little help with bathing/dressing/bathroom;Assistance with cooking/housework;Direct supervision/assist for medications management;Assist for transportation;Help with stairs or ramp for entrance   Can travel by private vehicle     No  Equipment Recommendations  Rolling walker (2 wheels);BSC/3in1    Recommendations for Other Services       Precautions / Restrictions Precautions Precautions: Fall Restrictions Weight Bearing Restrictions: No     Mobility  Bed Mobility Overal bed mobility: Needs Assistance Bed Mobility: Supine to Sit     Supine to sit: Min assist, HOB elevated, Used rails     General bed mobility comments: cues for sequencing direction of transfer off EOB and assist to fully raise trunk. Min-mod assist with use of bed pad to scoot to EOB.    Transfers Overall transfer level: Needs assistance Equipment used: Rolling walker (2 wheels) Transfers: Sit to/from Stand Sit to Stand:  Min assist           General transfer comment: cues for hand placement and min assist to power up and steady balance.    Ambulation/Gait Ambulation/Gait assistance: Min assist Gait Distance (Feet): 100 Feet Assistive device: Rolling walker (2 wheels) Gait Pattern/deviations: Step-through pattern, Decreased stride length, Shuffle, Trunk flexed, Wide base of support Gait velocity: decr     General Gait Details: cues to maintain safe proximity to RW and assist intermittently. pt with flexed trunk and cues for posture requried throughout. knees flexing as pt fatigues.   Stairs             Wheelchair Mobility     Tilt Bed    Modified Rankin (Stroke Patients Only)       Balance Overall balance assessment: Needs assistance Sitting-balance support: No upper extremity supported, Feet supported Sitting balance-Leahy Scale: Fair     Standing balance support: Bilateral upper extremity supported, During functional activity, Reliant on assistive device for balance Standing balance-Leahy Scale: Poor Standing balance comment: reliant on RW                            Cognition Arousal: Alert, Lethargic (fatigued/lethargic at end of session) Behavior During Therapy: WFL for tasks assessed/performed Overall Cognitive Status: Impaired/Different from baseline Area of Impairment: Safety/judgement, Problem solving                         Safety/Judgement: Decreased awareness of deficits, Decreased awareness of safety   Problem Solving: Requires verbal cues, Difficulty sequencing  Exercises      General Comments        Pertinent Vitals/Pain Pain Assessment Pain Assessment: Faces Faces Pain Scale: Hurts a little bit Pain Location: head Pain Descriptors / Indicators: Aching, Constant Pain Intervention(s): Limited activity within patient's tolerance, Monitored during session, Repositioned    Home Living                           Prior Function            PT Goals (current goals can now be found in the care plan section) Acute Rehab PT Goals Patient Stated Goal: wants to be with family at holidays PT Goal Formulation: With patient/family Time For Goal Achievement: 01/11/23 Potential to Achieve Goals: Good Progress towards PT goals: Progressing toward goals    Frequency    Min 1X/week      PT Plan      Co-evaluation              AM-PAC PT "6 Clicks" Mobility   Outcome Measure  Help needed turning from your back to your side while in a flat bed without using bedrails?: A Little Help needed moving from lying on your back to sitting on the side of a flat bed without using bedrails?: A Little Help needed moving to and from a bed to a chair (including a wheelchair)?: A Little Help needed standing up from a chair using your arms (e.g., wheelchair or bedside chair)?: A Little Help needed to walk in hospital room?: A Little Help needed climbing 3-5 steps with a railing? : Total 6 Click Score: 16    End of Session Equipment Utilized During Treatment: Gait belt;Oxygen Activity Tolerance: Patient tolerated treatment well Patient left: in chair;with call bell/phone within reach;with chair alarm set;with family/visitor present Nurse Communication: Mobility status PT Visit Diagnosis: Other abnormalities of gait and mobility (R26.89);Muscle weakness (generalized) (M62.81);Difficulty in walking, not elsewhere classified (R26.2);Pain Pain - Right/Left:  (face) Pain - part of body:  (face)     Time: 1610-9604 PT Time Calculation (min) (ACUTE ONLY): 24 min  Charges:    $Gait Training: 8-22 mins $Therapeutic Activity: 8-22 mins PT General Charges $$ ACUTE PT VISIT: 1 Visit                     Wynn Maudlin, DPT Acute Rehabilitation Services Office [phone]  12/29/22 4:20 PM

## 2022-12-29 NOTE — Inpatient Diabetes Management (Signed)
Inpatient Diabetes Program Recommendations  AACE/ADA: New Consensus Statement on Inpatient Glycemic Control (2015)  Target Ranges:  Prepandial:   less than 140 mg/dL      Peak postprandial:   less than 180 mg/dL (1-2 hours)      Critically ill patients:  140 - 180 mg/dL   Lab Results  Component Value Date   GLUCAP 212 (H) 12/29/2022   HGBA1C 7.8 04/22/2022    Review of Glycemic Control  Latest Reference Range & Units 12/29/22 08:03 12/29/22 12:00  Glucose-Capillary 70 - 99 mg/dL 981 (H) 191 (H)  (H): Data is abnormally high  Outpatient Diabetes medications:  Omnipod Dash with Novolog and Freestyle Libre 2 CGM Basal-12a-1.4/hr, 3a-0.8/hr, 12p-1.4/hr (total 30) ICR-1 unit for every 5 carbs ISF-1 unit drops her 20 mg/dL Target glucose 478 mg/dL Trulicity 4.5 mg weekly   Current orders for Inpatient glycemic control:  Semglee 25 units every day  Novolog 0-6 units TID Novolog 10 units TID  Inpatient Diabetes Program Recommendations:    Semglee 30 units every day Novolog 12 units TID with meals if she consumes at least 50%  Will continue to follow while inpatient.  Thank you, Dulce Sellar, MSN, CDCES Diabetes Coordinator Inpatient Diabetes Program [phone] (team pager from 8a-5p)

## 2022-12-29 NOTE — Plan of Care (Signed)

## 2022-12-29 NOTE — Plan of Care (Signed)
  Problem: Education: Goal: Ability to describe self-care measures that may prevent or decrease complications (Diabetes Survival Skills Education) will improve Outcome: Progressing   Problem: Coping: Goal: Ability to adjust to condition or change in health will improve 12/29/2022 0438 by Carylon Perches, LPN Outcome: Progressing 12/28/2022 2352 by Carylon Perches, LPN Outcome: Progressing   Problem: Fluid Volume: Goal: Ability to maintain a balanced intake and output will improve 12/29/2022 0438 by Carylon Perches, LPN Outcome: Progressing 12/28/2022 2352 by Carylon Perches, LPN Outcome: Progressing   Problem: Health Behavior/Discharge Planning: Goal: Ability to identify and utilize available resources and services will improve 12/29/2022 0438 by Carylon Perches, LPN Outcome: Progressing 12/28/2022 2352 by Carylon Perches, LPN Outcome: Progressing Goal: Ability to manage health-related needs will improve 12/29/2022 0438 by Carylon Perches, LPN Outcome: Progressing 12/28/2022 2352 by Carylon Perches, LPN Outcome: Progressing   Problem: Metabolic: Goal: Ability to maintain appropriate glucose levels will improve 12/29/2022 0438 by Carylon Perches, LPN Outcome: Progressing 12/28/2022 2352 by Carylon Perches, LPN Outcome: Progressing   Problem: Nutritional: Goal: Maintenance of adequate nutrition will improve 12/29/2022 0438 by Carylon Perches, LPN Outcome: Progressing 12/28/2022 2352 by Carylon Perches, LPN Outcome: Progressing Goal: Progress toward achieving an optimal weight will improve 12/29/2022 0438 by Carylon Perches, LPN Outcome: Progressing 12/28/2022 2352 by Carylon Perches, LPN Outcome: Progressing   Problem: Skin Integrity: Goal: Risk for impaired skin integrity will decrease 12/29/2022 0438 by Carylon Perches, LPN Outcome: Progressing 12/28/2022 2352 by Carylon Perches, LPN Outcome: Progressing   Problem: Tissue  Perfusion: Goal: Adequacy of tissue perfusion will improve 12/29/2022 0438 by Carylon Perches, LPN Outcome: Progressing 12/28/2022 2352 by Carylon Perches, LPN Outcome: Progressing

## 2022-12-29 NOTE — TOC CAGE-AID Note (Signed)
Transition of Care [facility]) - CAGE-AID Screening   Patient Details  Name: Jocelyn Wells MRN: [id number] Date of Birth: 05-10-1955  [facility]) CM/SW Contact:    Janora Norlander, RN Phone Number: [phone] 12/29/2022, 9:30 AM   Clinical Narrative: Pt in hospital after sustaining a significant lac to forehead after leaning over trying to pick up her dog's feces.  Pt has had several falls which tend to occur while not using her walker.  Pt reports she quit smoking approx 6 months ago and does not do drugs or drink alcohol.  Screening complete.    CAGE-AID Screening:    Have You Ever Felt You Ought to Cut Down on Your Drinking or Drug Use?: No Have People Annoyed You By Critizing Your Drinking Or Drug Use?: No Have You Felt Bad Or Guilty About Your Drinking Or Drug Use?: No Have You Ever Had a Drink or Used Drugs First Thing In The Morning to Steady Your Nerves or to Get Rid of a Hangover?: No CAGE-AID Score: 0  Substance Abuse Education Offered: No

## 2022-12-30 ENCOUNTER — Other Ambulatory Visit (HOSPITAL_COMMUNITY): Payer: Self-pay

## 2022-12-30 DIAGNOSIS — S0101XA Laceration without foreign body of scalp, initial encounter: Secondary | ICD-10-CM | POA: Diagnosis not present

## 2022-12-30 LAB — RENAL FUNCTION PANEL
Albumin: 2.7 g/dL — ABNORMAL LOW (ref 3.5–5.0)
Anion gap: 7 (ref 5–15)
BUN: 30 mg/dL — ABNORMAL HIGH (ref 8–23)
CO2: 26 mmol/L (ref 22–32)
Calcium: 9.3 mg/dL (ref 8.9–10.3)
Chloride: 100 mmol/L (ref 98–111)
Creatinine, Ser: 4.64 mg/dL — ABNORMAL HIGH (ref 0.44–1.00)
GFR, Estimated: 10 mL/min — ABNORMAL LOW (ref >60)
Glucose, Bld: 339 mg/dL — ABNORMAL HIGH (ref 70–99)
Phosphorus: 5.7 mg/dL — ABNORMAL HIGH (ref 2.5–4.6)
Potassium: 4.2 mmol/L (ref 3.5–5.1)
Sodium: 133 mmol/L — ABNORMAL LOW (ref 135–145)

## 2022-12-30 LAB — GLUCOSE, CAPILLARY
Glucose-Capillary: 303 mg/dL — ABNORMAL HIGH (ref 70–99)
Glucose-Capillary: 261 mg/dL — ABNORMAL HIGH (ref 70–99)
Glucose-Capillary: 119 mg/dL — ABNORMAL HIGH (ref 70–99)

## 2022-12-30 LAB — CBC
HCT: 28.2 % — ABNORMAL LOW (ref 36.0–46.0)
Hemoglobin: 8.5 g/dL — ABNORMAL LOW (ref 12.0–15.0)
MCH: 26.8 pg (ref 26.0–34.0)
MCHC: 30.1 g/dL (ref 30.0–36.0)
MCV: 89 fL (ref 80.0–100.0)
Platelets: 398 10*3/uL (ref 150–400)
RBC: 3.17 MIL/uL — ABNORMAL LOW (ref 3.87–5.11)
RDW: 18.7 % — ABNORMAL HIGH (ref 11.5–15.5)
WBC: 11.2 10*3/uL — ABNORMAL HIGH (ref 4.0–10.5)
nRBC: 0 % (ref 0.0–0.2)

## 2022-12-30 NOTE — [place] FL2 (Signed)
[place] MEDICAID FL2 LEVEL OF CARE FORM     IDENTIFICATION  Patient Name: Jocelyn Wells Birthdate: [birth year] Sex: female Admission Date (Current Location): 12/27/2022  [facility] and [place] Number:  Producer, television/film/video and Address:  [facility] [place], [address] [place], [place] [address]      Provider Number: [id number]  Attending Physician Name and Address:  Azucena Fallen, MD  Relative Name and Phone Number:       Current Level of Care: Hospital Recommended Level of Care: Skilled Nursing Facility Prior Approval Number:    Date Approved/Denied:   PASRR Number: [id number]  Discharge Plan: SNF    Current Diagnoses: Patient Active Problem List   Diagnosis Date Noted   Fall 12/28/2022   Scalp laceration, initial encounter 12/27/2022   ESRD on dialysis [facility]) 12/27/2022   Essential thrombocytosis (HCC) 12/27/2022   Chronic painful diabetic neuropathy (HCC) 10/25/2022   Encounter for screening mammogram for malignant neoplasm of breast 06/01/2022   Encounter for subsequent annual wellness visit (AWV) in Medicare patient 06/01/2022   Atherosclerosis of native arteries of extremity with intermittent claudication (HCC) 05/03/2022   Polyp of ascending colon 02/15/2022   Symptomatic anemia 01/01/2022   Foot pain, right 12/22/2021   Pain of right lower extremity 12/22/2021   Iron deficiency anemia 12/17/2021   Personal history of nicotine dependence 12/04/2021   Leukocytosis 12/04/2021   Long toenail 12/04/2021   Screening for colon cancer 12/04/2021   Depression, recurrent (HCC) 12/04/2021   Hospital discharge follow-up 09/02/2021   Hyperkalemia 08/15/2021   Acute on chronic diastolic CHF (congestive heart failure) (HCC) 08/14/2021   Atherosclerosis of native arteries of the extremities with ulceration (HCC) 08/12/2021   Positive depression screening 07/14/2021   DOE (dyspnea on exertion) 07/14/2021   Bilateral leg edema 07/14/2021    Frequent falls 04/13/2021   Anemia in chronic kidney disease 03/18/2021   Heart failure, unspecified (HCC) 03/18/2021   Hyperparathyroidism due to renal insufficiency (HCC) 03/18/2021   Chronic depression 03/18/2021   Overweight 03/18/2021   Poor appetite 02/13/2021   MDD (major depressive disorder), recurrent episode, mild (HCC) 02/13/2021   Benign hypertensive kidney disease with chronic kidney disease 10/14/2020   CKD (chronic kidney disease), stage IV (HCC) 05/31/2020   Compression fracture of L1 lumbar vertebra (HCC) 01/30/2019   Compression fracture of body of thoracic vertebra (HCC) 01/30/2019   Lumbar foraminal stenosis (RIGHT L1) 01/30/2019   Spinal stenosis of lumbar region with neurogenic claudication (L4-L5) 01/30/2019   Osteoporosis, post-menopausal 04/14/2018   Vitamin D deficiency 04/14/2018   Chronic deep vein thrombosis (DVT) (HCC) 02/09/2018   Post-menopausal 11/17/2017   Aortic atherosclerosis (HCC) 11/08/2017   Snoring 04/24/2015   Sleep paralysis, recurrent isolated 04/24/2015   Hypersomnia with sleep apnea 04/24/2015   Cataplexy 04/24/2015   Morbid obesity due to excess calories (HCC) 04/24/2015   COPD (chronic obstructive pulmonary disease) (HCC) 04/24/2015   History of stroke 04/23/2015   Acute renal failure superimposed on stage 4 chronic kidney disease (HCC) 01/29/2015   Dizziness 07/05/2014   Essential hypertension 07/05/2014   Carbuncle and furuncle 07/05/2014   Pain of perianal area 07/05/2014   Guttate psoriasis 07/05/2014   H/O: osteoarthritis 06/05/2014   Anxiety, generalized 06/05/2014   Microalbuminuria 12/23/2013   Long term current use of insulin (HCC) 12/23/2013   Type 2 diabetes mellitus with other diabetic kidney complication (HCC) 12/23/2013   Disturbance of skin sensation 09/02/2006   Difficulty hearing 07/25/2006  Cephalalgia 05/27/2006   Hyperlipidemia associated with type 2 diabetes mellitus (HCC) 10/22/2005   Arthritis,  degenerative 10/22/2005    Orientation RESPIRATION BLADDER Height & Weight     Self, Time, Situation, Place  O2 (3L [place]) Continent Weight: 192 lb 0.3 oz (87.1 kg) Height:  5\' 6"  (167.6 cm)  BEHAVIORAL SYMPTOMS/MOOD NEUROLOGICAL BOWEL NUTRITION STATUS      Continent    AMBULATORY STATUS COMMUNICATION OF NEEDS Skin   Limited Assist   Normal                       Personal Care Assistance Level of Assistance  Bathing, Feeding, Dressing Bathing Assistance: Limited assistance Feeding assistance: Limited assistance Dressing Assistance: Limited assistance     Functional Limitations Info  Sight, Hearing, Speech Sight Info: Adequate Hearing Info: Adequate Speech Info: Adequate    SPECIAL CARE FACTORS FREQUENCY  PT (By licensed PT), OT (By licensed OT)                    Contractures Contractures Info: Not present    Additional Factors Info  Code Status, Allergies Code Status Info: FULL CODE Allergies Info: Codeine: Nausea And Vomiting; Ivp Dye (iodinated Contrast Media): Not Specified Allergy  Patients states the IV Dye shuts her kidneys down           Current Medications (12/30/2022):  This is the current hospital active medication list Current Facility-Administered Medications  Medication Dose Route Frequency Provider Last Rate Last Admin   (feeding supplement) PROSource Plus liquid 30 mL  30 mL Oral BID BM Pola Corn, NP   30 mL at 12/29/22 0934   acetaminophen (TYLENOL) tablet 650 mg  650 mg Oral Q6H PRN Briscoe Deutscher, MD   650 mg at 12/28/22 1642   Or   acetaminophen (TYLENOL) suppository 650 mg  650 mg Rectal Q6H PRN Opyd, Lavone Neri, MD       albuterol (PROVENTIL) (2.5 MG/3ML) 0.083% nebulizer solution 2.5 mg  2.5 mg Inhalation Q6H PRN Opyd, Lavone Neri, MD       arformoterol (BROVANA) nebulizer solution 15 mcg  15 mcg Nebulization BID Opyd, Lavone Neri, MD   15 mcg at 12/29/22 2025   And   umeclidinium bromide (INCRUSE ELLIPTA) 62.5 MCG/ACT 1 puff   1 puff Inhalation Daily Opyd, Lavone Neri, MD   1 puff at 12/29/22 0845   ascorbic acid (VITAMIN C) tablet 250 mg  250 mg Oral Daily Pola Corn, NP   250 mg at 12/29/22 0933   atorvastatin (LIPITOR) tablet 40 mg  40 mg Oral Daily Opyd, Lavone Neri, MD   40 mg at 12/29/22 0933   buPROPion (WELLBUTRIN SR) 12 hr tablet 150 mg  150 mg Oral BID Briscoe Deutscher, MD   150 mg at 12/29/22 2105   calcitRIOL (ROCALTROL) capsule 0.25 mcg  0.25 mcg Oral Daily Opyd, Lavone Neri, MD   0.25 mcg at 12/29/22 0933   cephALEXin (KEFLEX) capsule 500 mg  500 mg Oral Q12H Leatha Gilding, MD   500 mg at 12/29/22 2101   Chlorhexidine Gluconate Cloth 2 % PADS 6 each  6 each Topical Q0600 Delano Metz, MD   6 each at 12/30/22 0615   fentaNYL (SUBLIMAZE) injection 12.5-50 mcg  12.5-50 mcg Intravenous Q2H PRN Opyd, Lavone Neri, MD       gabapentin (NEURONTIN) capsule 300 mg  300 mg Oral Daily Opyd, Lavone Neri, MD   300 mg  at 12/29/22 0935   insulin aspart (novoLOG) injection 0-6 Units  0-6 Units Subcutaneous TID WC Leatha Gilding, MD   3 Units at 12/29/22 1727   insulin aspart (novoLOG) injection 10 Units  10 Units Subcutaneous TID WC Leatha Gilding, MD   10 Units at 12/29/22 1727   insulin glargine-yfgn (SEMGLEE) injection 25 Units  25 Units Subcutaneous Daily Leatha Gilding, MD   25 Units at 12/29/22 0935   multivitamin (RENA-VIT) tablet 1 tablet  1 tablet Oral QHS Pola Corn, NP   1 tablet at 12/29/22 2101   naphazoline-glycerin (CLEAR EYES REDNESS) ophth solution 2 drop  2 drop Left Eye QID PRN Leatha Gilding, MD       ondansetron [facility]) injection 4 mg  4 mg Intravenous Q6H PRN Opyd, Lavone Neri, MD       oxyCODONE (Oxy IR/ROXICODONE) immediate release tablet 5 mg  5 mg Oral Q4H PRN Opyd, Lavone Neri, MD   5 mg at 12/29/22 1939   PARoxetine (PAXIL) tablet 40 mg  40 mg Oral Daily Opyd, Lavone Neri, MD   40 mg at 12/29/22 0935   sodium chloride flush (NS) 0.9 % injection 3 mL  3 mL Intravenous Q12H  Opyd, Lavone Neri, MD   3 mL at 12/29/22 2106     Discharge Medications: Please see discharge summary for a list of discharge medications.  Relevant Imaging Results:  Relevant Lab Results:   Additional Information SS# [id number]; HD at [facility] on [facility] on TTS 10:15  Dellie Burns [place], [place]

## 2022-12-30 NOTE — Progress Notes (Signed)
Mobility Specialist: Progress Note   12/30/22 1623  Mobility  Activity Ambulated with assistance in hallway  Level of Assistance Contact guard assist, steadying assist  Assistive Device Front wheel walker  Distance Ambulated (ft) 100 ft  Activity Response Tolerated well  Mobility Referral Yes  $Mobility charge 1 Mobility  Mobility Specialist Start Time (ACUTE ONLY) 1554  Mobility Specialist Stop Time (ACUTE ONLY) 1617  Mobility Specialist Time Calculation (min) (ACUTE ONLY) 23 min    Pre Mobility: SpO2 96% 1.5LO2 During Mobility: SpO2 91-92% 2LO2 Post Mobility: SpO2 93% 2LO2  Pt was agreeable to mobility session - received in bed. MinA for bed mobility to scoot to EOB, minA for STS with some unsteadiness upon standing, CG for ambulation once she catches her balance. No LOB throughout ambulation and no complaints. Returned to room without fault. Requested to use BSC; completed void and peri care independently in standing position. Left in bed with all needs met, call bell in reach.   Maurene Capes Mobility Specialist Please contact via SecureChat or Rehab office at [phone]

## 2022-12-30 NOTE — Progress Notes (Addendum)
Occupational Therapy Treatment Patient Details Name: Jocelyn Wells MRN: [id number] DOB: 1955/12/11 Today's Date: 12/30/2022   History of present illness Patient is 67 y.o. female presented to the hospital after a fall at home and scalp laceration. Pt reports recurrent falls as she has stopped using a RW. PMH significant for IDDM, COPD with nocturnal oxygen, thrombocytosis, ESRD on HD.   OT comments  Pt in good spirits, eager to participate. Pt stated she wishes to go home, but after session understands her deficits and agreeable to postacute rehab. Pt improving with overall strength/mobility.  [name] was stable for most of the session without supplemental O2, but stayed at 89 at end of session, 2L O2 supplementation quickly improved to 95.  Pt has overall decreased awareness of deficits due to new L eye vision loss from swelling, making it difficult to navigate with RW. Pt was unable to successfully navigate room due to poor vision without getting RW caught of table or carts. Pt had no LOB as she adjusted the RW to navigate. Pt at this time not safe for home setting unless 24/7 close supervision with ambulation, would benefit from postacute rehab <3hrs/day until improves back to baseline.      If plan is discharge home, recommend the following:  A little help with walking and/or transfers;A little help with bathing/dressing/bathroom;Assistance with cooking/housework;Assist for transportation;Help with stairs or ramp for entrance   Equipment Recommendations  BSC/3in1    Recommendations for Other Services      Precautions / Restrictions Precautions Precautions: Fall Restrictions Weight Bearing Restrictions: No       Mobility Bed Mobility Overal bed mobility: Needs Assistance Bed Mobility: Supine to Sit, Sit to Supine     Supine to sit: Min assist, HOB elevated, Used rails Sit to supine: Min assist   General bed mobility comments: cueing for positioning sitting back in bed,  overall good bed mobility with HOB elevated    Transfers Overall transfer level: Needs assistance Equipment used: Rolling walker (2 wheels) Transfers: Sit to/from Stand Sit to Stand: Min assist     Step pivot transfers: Min assist     General transfer comment: min A for transfers, mild occasional LOB when standing at times, once up no LOB.     Balance Overall balance assessment: Needs assistance Sitting-balance support: No upper extremity supported, Feet supported Sitting balance-Leahy Scale: Fair Sitting balance - Comments: EOB ADLs   Standing balance support: Bilateral upper extremity supported, During functional activity, Reliant on assistive device for balance Standing balance-Leahy Scale: Fair Standing balance comment: able to stand and open doors, and use other hand to move RW, stands unsupported at times                           ADL either performed or assessed with clinical judgement   ADL Overall ADL's : Needs assistance/impaired Eating/Feeding: Set up;Sitting               Upper Body Dressing : Minimal assistance;Sitting   Lower Body Dressing: Minimal assistance;Sit to/from stand   Toilet Transfer: Minimal assistance;Rolling walker (2 wheels);BSC/3in1;Ambulation   Toileting- Clothing Manipulation and Hygiene: Minimal assistance;Sitting/lateral lean       Functional mobility during ADLs: Contact guard assist;Rolling walker (2 wheels) General ADL Comments: Pt able to complete dressing min A at EOB, min A for perineal hygiene for balance while standing    Extremity/Trunk Assessment Upper Extremity Assessment RUE Deficits / Details: decreased FM skills,  overall good strength/ROM RUE Sensation: WNL RUE Coordination: decreased fine motor LUE Deficits / Details: L 3rd digit pain, minor swelling at PIP, full AROM, states it hurts since fall. Decreased FM skills LUE Sensation: WNL LUE Coordination: decreased fine motor   Lower Extremity  Assessment Lower Extremity Assessment: Defer to PT evaluation        Vision       Perception     Praxis      Cognition Arousal: Alert Behavior During Therapy: [facility] for tasks assessed/performed Overall Cognitive Status: Within Functional Limits for tasks assessed                           Safety/Judgement: Decreased awareness of safety     General Comments: Pt able to fully participate, Pt has difficulty understanding how bad her  vision is and how that affects safety with mobility.        Exercises      Shoulder Instructions       General Comments      Pertinent Vitals/ Pain       Pain Assessment Pain Assessment: 0-10 Pain Score: 3  Pain Location: head Pain Descriptors / Indicators: Aching, Constant Pain Intervention(s): Monitored during session  Home Living                                          Prior Functioning/Environment              Frequency  Min 1X/week        Progress Toward Goals  OT Goals(current goals can now be found in the care plan section)  Progress towards OT goals: Progressing toward goals  Acute Rehab OT Goals Patient Stated Goal: to return home OT Goal Formulation: With patient/family Time For Goal Achievement: 01/11/23 Potential to Achieve Goals: Good  Plan      Co-evaluation                 AM-PAC OT "6 Clicks" Daily Activity     Outcome Measure   Help from another person eating meals?: A Little Help from another person taking care of personal grooming?: A Little Help from another person toileting, which includes using toliet, bedpan, or urinal?: A Little Help from another person bathing (including washing, rinsing, drying)?: A Little Help from another person to put on and taking off regular upper body clothing?: A Little Help from another person to put on and taking off regular lower body clothing?: A Little 6 Click Score: 18    End of Session Equipment Utilized During  Treatment: Gait belt;Rolling walker (2 wheels)  OT Visit Diagnosis: Unsteadiness on feet (R26.81);Other abnormalities of gait and mobility (R26.89);Repeated falls (R29.6);Muscle weakness (generalized) (M62.81);History of falling (Z91.81);Pain   Activity Tolerance Patient tolerated treatment well   Patient Left in bed;with call bell/phone within reach;with nursing/sitter in room   Nurse Communication Mobility status        Time: 1120-1204 OT Time Calculation (min): 44 min  Charges: OT General Charges $OT Visit: 1 Visit OT Treatments $Self Care/Home Management : 23-37 mins $Therapeutic Activity: 8-22 mins  Aristide Waggle, OTR/L   Alexis Goodell 12/30/2022, 2:46 PM

## 2022-12-30 NOTE — Progress Notes (Signed)
Cannulated venous site x2.  1st site infiltrated.  When restuck above but unable to get good blood flow.  All needles pulled.  Patient c/o pain at site.  Ice applied to graft for 15 minutes. No bleeding from sites.  Patient resting quietly in bed.

## 2022-12-30 NOTE — Progress Notes (Signed)
PROGRESS NOTE  Jocelyn Wells MVH:[id number] DOB: September 26, 1955 DOA: 12/27/2022 PCP: Jacky Kindle, FNP   LOS: 2 days   Brief Narrative / Interim history: 67 year old female with IDDM, COPD with nocturnal oxygen, thrombocytosis, ESRD on HD who comes into the hospital after a fall at home and a scalp laceration.  She has recurrent falls, denies syncope but has been consistently not using her walker which is one of the reasons for why she falls.  Recently missed dialysis due to nausea and vomiting, and husband tells me that she sometimes gets weaker if she misses dialysis and that may have played a role.  She has had a large left anterior scalp laceration, ENT was consulted and she was taken to the OR for repair   Subjective / 24h Interval events: She is doing well this morning, complains of a headache  Assesement and Plan: Principal Problem:   Scalp laceration, initial encounter Active Problems:   Type 2 diabetes mellitus with other diabetic kidney complication (HCC)   Anxiety, generalized   Essential hypertension   History of stroke   COPD (chronic obstructive pulmonary disease) (HCC)   Chronic deep vein thrombosis (DVT) (HCC)   MDD (major depressive disorder), recurrent episode, mild (HCC)   ESRD on dialysis [facility])   Essential thrombocytosis (HCC)   Fall   Ambulatory dysfunction Mechanical fall Scalp laceration Dr. Merrilee Jansky with ENT consulted, patient was taken to the OR and she is status post laceration repair on 11/18.  ENT recommends antibiotics for 5 days and keep pressure dressing in place for a week up until she is seen as an outpatient next week.  Okay per ENT to resume Eliquis, will resume tomorrow. She worked with PT, and SNF was recommended. Patient is agreeable. TOC consulted.  Apparently she needs to be here for 3 midnights, likely to be discharged on Friday   COPD, chronic hypoxia-no wheezing, continue home medications  Essential thrombocytosis-continue home medications  on discharge   ESRD-nephrology consulted, she was dialyzed while here  History of DVT-okay to resume Eliquis per ENT, will start tomorrow.  Prior CVA-continue Lipitor, Eliquis  Anemia due to chronic kidney disease-monitor hemoglobin  IDDM -poorly controlled, with hyperglycemia, A1c 11.9.  She is not using insulin pump at home since it broke down and they will receive a pump in 1-2 days. For now keep on glargine + SSI  CBG (last 3)  Recent Labs    12/29/22 1200 12/29/22 1640 12/29/22 2110  GLUCAP 212* 255* 255*     Scheduled Meds:  (feeding supplement) PROSource Plus  30 mL Oral BID BM   arformoterol  15 mcg Nebulization BID   And   umeclidinium bromide  1 puff Inhalation Daily   ascorbic acid  250 mg Oral Daily   atorvastatin  40 mg Oral Daily   buPROPion  150 mg Oral BID   calcitRIOL  0.25 mcg Oral Daily   cephALEXin  500 mg Oral Q12H   Chlorhexidine Gluconate Cloth  6 each Topical Q0600   gabapentin  300 mg Oral Daily   insulin aspart  0-6 Units Subcutaneous TID WC   insulin aspart  10 Units Subcutaneous TID WC   insulin glargine-yfgn  25 Units Subcutaneous Daily   multivitamin  1 tablet Oral QHS   PARoxetine  40 mg Oral Daily   sodium chloride flush  3 mL Intravenous Q12H   Continuous Infusions: PRN Meds:.acetaminophen **OR** acetaminophen, albuterol, fentaNYL (SUBLIMAZE) injection, naphazoline-glycerin, ondansetron (ZOFRAN) IV, oxyCODONE  Current Outpatient Medications  Medication Instructions   albuterol (VENTOLIN HFA) 108 (90 Base) MCG/ACT inhaler TAKE 2 PUFFS BY MOUTH EVERY 6 HOURS AS NEEDED FOR WHEEZE OR SHORTNESS OF BREATH   apixaban (ELIQUIS) 5 mg, Oral, 2 times daily   atorvastatin (LIPITOR) 40 mg, Oral, Daily   buPROPion (WELLBUTRIN SR) 150 mg, Oral, 2 times daily   calcitRIOL (ROCALTROL) 0.25 mcg, Oral, Daily   cephALEXin (KEFLEX) 500 mg, Oral, Every 12 hours   fluticasone (FLONASE) 50 MCG/ACT nasal spray 2 sprays, Each Nare, Daily   gabapentin  (NEURONTIN) 300 mg, Oral, Daily   glucose blood test strip    hydroxyurea (HYDREA) 500 mg, Oral, Every other day, May take with food to minimize GI side effects.   insulin aspart (NOVOLOG) 100 UNIT/ML injection Insulin pump   Insulin Disposable Pump (OMNIPOD DASH PODS, GEN 4,) MISC USE 1 POD EACH EVERY 72    HOURS   Insulin Human (INSULIN PUMP) SOLN Per endocrinoloy   loratadine (CLARITIN) 10 MG tablet 1 tablet, Oral, Daily   meclizine (ANTIVERT) 12.5 mg, Oral, 3 times daily PRN   oxyCODONE (OXY IR/ROXICODONE) 5 mg, Oral, Every 6 hours PRN   OXYGEN 2 L, Nasal, Continuous   PARoxetine (PAXIL) 40 mg, Oral, Daily   Spacer/Aero-Holding Chambers (OPTICHAMBER DIAMOND) MISC Use with inhaler to ensure medication is delivered throughout lungs   Tiotropium Bromide-Olodaterol (STIOLTO RESPIMAT) 2.5-2.5 MCG/ACT AERS 2 puffs, Inhalation, Daily   torsemide (DEMADEX) 20 MG tablet TAKE 3 TABLETS BY MOUTH EVERY DAY   traMADol (ULTRAM) 50 mg, Oral, Every 12 hours PRN   Trulicity 4.5 mg, Subcutaneous, Every 7 days   Vitamin D (Ergocalciferol) (DRISDOL) 50,000 Units, Oral, Every 7 days, Sunday     Diet Orders (From admission, onward)     Start     Ordered   12/27/22 0955  Diet renal with fluid restriction Fluid restriction: 1200 mL Fluid; Room service appropriate? Yes; Fluid consistency: Thin  Diet effective now       Question Answer Comment  Fluid restriction: 1200 mL Fluid   Room service appropriate? Yes   Fluid consistency: Thin      11 /18/24 0954            DVT prophylaxis: SCDs Start: 12/27/22 0557   Lab Results  Component Value Date   PLT 398 12/30/2022      Code Status: Full Code  Family Communication: husband and son at bedside   Status is: Inpatient   Level of care: Telemetry Medical  Consultants:  ENT  Objective: Vitals:   12/30/22 0345 12/30/22 0529 12/30/22 0722 12/30/22 0745  BP:  (!) 93/55 (!) 131/59 (!) 118/47  Pulse:  78 75 79  Resp:  18 (!) 35 (!) 27  Temp:   98.6 F (37 C) 98 F (36.7 C)   TempSrc:      SpO2:  91%  (!) 89%  Weight: 89.8 kg  87.1 kg   Height:        Intake/Output Summary (Last 24 hours) at 12/30/2022 0830 Last data filed at 12/30/2022 1191 Gross per 24 hour  Intake 270 ml  Output 0 ml  Net 270 ml   Wt Readings from Last 3 Encounters:  12/30/22 87.1 kg  12/08/22 89 kg  12/06/22 91.2 kg    Examination:  Constitutional: NAD Eyes: lids and conjunctivae normal, no scleral icterus ENMT: mmm Neck: normal, supple Respiratory: clear to auscultation bilaterally, no wheezing, no crackles. Normal respiratory effort.  Cardiovascular: Regular rate and rhythm, no murmurs /  rubs / gallops. No LE edema. Abdomen: soft, no distention, no tenderness. Bowel sounds positive.    Data Reviewed: I have independently reviewed following labs and imaging studies   CBC Recent Labs  Lab 12/27/22 0412 12/27/22 0421 12/28/22 0516 12/29/22 0544 12/30/22 0538  WBC 11.6*  --  14.8* 11.6* 11.2*  HGB 10.5* 11.9* 8.4* 8.4* 8.5*  HCT 35.6* 35.0* 28.6* 29.2* 28.2*  PLT 488*  --  455* 406* 398  MCV 91.8  --  91.4 90.7 89.0  MCH 27.1  --  26.8 26.1 26.8  MCHC 29.5*  --  29.4* 28.8* 30.1  RDW 18.2*  --  18.7* 18.7* 18.7*    Recent Labs  Lab 12/27/22 0412 12/27/22 0421 12/28/22 0516 12/29/22 0544 12/29/22 1211 12/30/22 0538  NA 134* 137 136 134* 135 133*  K 3.8 3.9 4.0 4.1 3.9 4.2  CL 98 99 102 97* 98 100  CO2 23  --  26 27 26 26   GLUCOSE 360* 362* 284* 275* 235* 339*  BUN 23 27* 27* 16 18 30*  CREATININE 4.21* 4.40* 4.80* 3.79* 3.96* 4.64*  CALCIUM 9.6  --  9.2 9.1 9.2 9.3  AST 13*  --  11*  --   --   --   ALT 12  --  9  --   --   --   ALKPHOS 83  --  72  --   --   --   BILITOT 0.5  --  0.2  --   --   --   ALBUMIN 2.9*  --  2.7*  --  2.8* 2.7*  MG  --   --  1.8  --   --   --   INR 1.6*  --   --   --   --   --      ------------------------------------------------------------------------------------------------------------------ No results for input(s): "CHOL", "HDL", "LDLCALC", "TRIG", "CHOLHDL", "LDLDIRECT" in the last 72 hours.  Lab Results  Component Value Date   HGBA1C 7.8 04/22/2022   ------------------------------------------------------------------------------------------------------------------ No results for input(s): "TSH", "T4TOTAL", "T3FREE", "THYROIDAB" in the last 72 hours.  Invalid input(s): "FREET3"  Cardiac Enzymes No results for input(s): "CKMB", "TROPONINI", "MYOGLOBIN" in the last 168 hours.  Invalid input(s): "CK" ------------------------------------------------------------------------------------------------------------------    Component Value Date/Time   BNP 735.3 (H) 02/11/2022 1137    CBG: Recent Labs  Lab 12/28/22 2212 12/29/22 0803 12/29/22 1200 12/29/22 1640 12/29/22 2110  GLUCAP 165* 265* 212* 255* 255*    No results found for this or any previous visit (from the past 240 hour(s)).   Radiology Studies: No results found.   Carma Leaven DO [facility]  Between 7 pm - 7 am I am not available, please contact night coverage MD/APP via Amion

## 2022-12-30 NOTE — Inpatient Diabetes Management (Signed)
Inpatient Diabetes Program Recommendations  AACE/ADA: New Consensus Statement on Inpatient Glycemic Control (2015)  Target Ranges:  Prepandial:   less than 140 mg/dL      [facility] postprandial:   less than 180 mg/dL (1-2 hours)      Critically ill patients:  140 - 180 mg/dL   Lab Results  Component Value Date   GLUCAP 303 (H) 12/30/2022   HGBA1C 7.8 04/22/2022    Review of Glycemic Control  Latest Reference Range & Units 12/29/22 08:03 12/29/22 12:00 12/29/22 16:40 12/29/22 21:10 12/30/22 09:33  Glucose-Capillary 70 - 99 mg/dL 213 (H) 086 (H) 578 (H) 255 (H) 303 (H)    Outpatient Diabetes medications:  Omnipod Dash with Novolog and Freestyle Libre 2 CGM Basal-12a-1.4/hr, 3a-0.8/hr, 12p-1.4/hr (total 30) ICR-1 unit for every 5 carbs ISF-1 unit drops her 20 mg/dL Target glucose 469 mg/dL Trulicity 4.5 mg weekly   Current orders for Inpatient glycemic control:  Semglee 25 units every day  Novolog 0-6 units TID Novolog 10 units TID  Inpatient Diabetes Program Recommendations:    -   Semglee 35 units every day   Will continue to follow while inpatient.  Thanks,  Christena Deem RN, MSN, BC-ADM Inpatient Diabetes Coordinator Team Pager [phone] (8a-5p)

## 2022-12-30 NOTE — TOC Initial Note (Signed)
Transition of Care [facility]) - Initial/Assessment Note    Patient Details  Name: Jocelyn Wells MRN: [id number] Date of Birth: 1955/09/26  Transition of Care [facility]) CM/SW Contact:    Deatra Robinson, [place] Phone Number: 12/30/2022, 9:05 AM  Clinical Narrative:  spoke with pt and pt's souse re PT recommendation for SNF. Pt reports agreeable to considering SNF based on available bed offers. Pt's spouse requesting [facility]. Will f/u with offers as available.   Dellie Burns, MSW, LCSW [phone] (coverage)                   Expected Discharge Plan: Skilled Nursing Facility Barriers to Discharge: Continued Medical Work up, SNF Pending bed offer   Patient Goals and CMS Choice   CMS Medicare.gov Compare Post Acute Care list provided to:: Patient Choice offered to / list presented to : Patient [place] ownership interest in [facility].provided to:: Patient    Expected Discharge Plan and Services     Post Acute Care Choice: Skilled Nursing Facility Living arrangements for the past 2 months: Single Family Home                                      Prior Living Arrangements/Services Living arrangements for the past 2 months: Single Family Home Lives with:: Spouse Patient language and need for interpreter reviewed:: No        Need for Family Participation in Patient Care: Yes (Comment) Care giver support system in place?: Yes (comment)   Criminal Activity/Legal Involvement Pertinent to Current Situation/Hospitalization: No - Comment as needed  Activities of Daily Living   ADL Screening (condition at time of admission) Independently performs ADLs?: Yes (appropriate for developmental age) Is the patient deaf or have difficulty hearing?: No Does the patient have difficulty seeing, even when wearing glasses/contacts?: No Does the patient have difficulty concentrating, remembering, or making decisions?: No  Permission Sought/Granted Permission sought  to share information with : Facility Medical sales representative, Family Supports Permission granted to share information with : Yes, Designer, fashion/clothing              Emotional Assessment       Orientation: : Oriented to Self, Oriented to Place, Oriented to  Time, Oriented to Situation Alcohol / Substance Use: Not Applicable Psych Involvement: No (comment)  Admission diagnosis:  ESRD (end stage renal disease) (HCC) [N18.6] Fall, initial encounter [W19.XXXA] Laceration of scalp, initial encounter [S01.01XA] Scalp laceration, initial encounter [S01.01XA] Fall [W19.XXXA] Patient Active Problem List   Diagnosis Date Noted   Fall 12/28/2022   Scalp laceration, initial encounter 12/27/2022   ESRD on dialysis [facility]) 12/27/2022   Essential thrombocytosis (HCC) 12/27/2022   Chronic painful diabetic neuropathy (HCC) 10/25/2022   Encounter for screening mammogram for malignant neoplasm of breast 06/01/2022   Encounter for subsequent annual wellness visit (AWV) in Medicare patient 06/01/2022   Atherosclerosis of native arteries of extremity with intermittent claudication (HCC) 05/03/2022   Polyp of ascending colon 02/15/2022   Symptomatic anemia 01/01/2022   Foot pain, right 12/22/2021   Pain of right lower extremity 12/22/2021   Iron deficiency anemia 12/17/2021   Personal history of nicotine dependence 12/04/2021   Leukocytosis 12/04/2021   Long toenail 12/04/2021   Screening for colon cancer 12/04/2021   Depression, recurrent (HCC) 12/04/2021   Hospital discharge follow-up 09/02/2021   Hyperkalemia 08/15/2021   Acute on chronic diastolic CHF (  congestive heart failure) (HCC) 08/14/2021   Atherosclerosis of native arteries of the extremities with ulceration (HCC) 08/12/2021   Positive depression screening 07/14/2021   DOE (dyspnea on exertion) 07/14/2021   Bilateral leg edema 07/14/2021   Frequent falls 04/13/2021   Anemia in chronic kidney disease 03/18/2021   Heart failure,  unspecified (HCC) 03/18/2021   Hyperparathyroidism due to renal insufficiency (HCC) 03/18/2021   Chronic depression 03/18/2021   Overweight 03/18/2021   Poor appetite 02/13/2021   MDD (major depressive disorder), recurrent episode, mild (HCC) 02/13/2021   Benign hypertensive kidney disease with chronic kidney disease 10/14/2020   CKD (chronic kidney disease), stage IV (HCC) 05/31/2020   Compression fracture of L1 lumbar vertebra (HCC) 01/30/2019   Compression fracture of body of thoracic vertebra (HCC) 01/30/2019   Lumbar foraminal stenosis (RIGHT L1) 01/30/2019   Spinal stenosis of lumbar region with neurogenic claudication (L4-L5) 01/30/2019   Osteoporosis, post-menopausal 04/14/2018   Vitamin D deficiency 04/14/2018   Chronic deep vein thrombosis (DVT) (HCC) 02/09/2018   Post-menopausal 11/17/2017   Aortic atherosclerosis (HCC) 11/08/2017   Snoring 04/24/2015   Sleep paralysis, recurrent isolated 04/24/2015   Hypersomnia with sleep apnea 04/24/2015   Cataplexy 04/24/2015   Morbid obesity due to excess calories (HCC) 04/24/2015   COPD (chronic obstructive pulmonary disease) (HCC) 04/24/2015   History of stroke 04/23/2015   Acute renal failure superimposed on stage 4 chronic kidney disease (HCC) 01/29/2015   Dizziness 07/05/2014   Essential hypertension 07/05/2014   Carbuncle and furuncle 07/05/2014   Pain of perianal area 07/05/2014   Guttate psoriasis 07/05/2014   H/O: osteoarthritis 06/05/2014   Anxiety, generalized 06/05/2014   Microalbuminuria 12/23/2013   Long term current use of insulin (HCC) 12/23/2013   Type 2 diabetes mellitus with other diabetic kidney complication (HCC) 12/23/2013   Disturbance of skin sensation 09/02/2006   Difficulty hearing 07/25/2006   Cephalalgia 05/27/2006   Hyperlipidemia associated with type 2 diabetes mellitus (HCC) 10/22/2005   Arthritis, degenerative 10/22/2005   PCP:  Jacky Kindle, FNP Pharmacy:   [facility] [address]  [place] [facility] [address] [place] [address] Phone: [phone] Fax: [phone]  [facility] [place], [place] - [address] [facility] AT Portal to Registered [facility] [address] [place] [address] Phone: [phone] Fax: [phone]  [facility] [address] [place] [address] Phone: [phone] Fax: [phone]     Social Determinants of Health (SDOH) Social History: SDOH Screenings   Food Insecurity: No Food Insecurity (12/27/2022)  Housing: Low Risk  (12/27/2022)  Transportation Needs: No Transportation Needs (12/27/2022)  Utilities: Not At Risk (12/27/2022)  Alcohol Screen: Low Risk  (12/04/2021)  Depression (PHQ2-9): Low Risk  (06/16/2022)  Recent Concern: Depression (PHQ2-9) - Medium Risk (06/01/2022)  Financial Resource Strain: Low Risk  (12/11/2021)  Physical Activity: Inactive (06/01/2022)  Social Connections: Moderately Isolated (12/11/2021)  Stress: No Stress Concern Present (06/01/2022)  Tobacco Use: Medium Risk (12/27/2022)   SDOH Interventions: Transportation Interventions: Intervention Not Indicated, Inpatient TOC, Patient Resources (Friends/Family)   Readmission Risk Interventions     No data to display

## 2022-12-30 NOTE — TOC Progression Note (Signed)
Transition of Care [facility]) - Progression Note    Patient Details  Name: Jocelyn Wells MRN: [id number] Date of Birth: April 25, 1955  Transition of Care [facility]) CM/SW Contact  Dellie Burns [place], [place] Phone Number: 12/30/2022, 3:24 PM  Clinical Narrative: pt in HD at time of visit. Provided current SNF offers ([facility] and [facility]) to pt's husband who has accepted [facility]. Confirmed bed with [facility] at [facility]. Anticipate dc tomorrow.   Dellie Burns, MSW, LCSW [phone] (coverage)       Expected Discharge Plan: Skilled Nursing Facility Barriers to Discharge: Continued Medical Work up, SNF Pending bed offer  Expected Discharge Plan and Services     Post Acute Care Choice: Skilled Nursing Facility Living arrangements for the past 2 months: Single Family Home                                       Social Determinants of Health (SDOH) Interventions SDOH Screenings   Food Insecurity: No Food Insecurity (12/27/2022)  Housing: Low Risk  (12/27/2022)  Transportation Needs: No Transportation Needs (12/27/2022)  Utilities: Not At Risk (12/27/2022)  Alcohol Screen: Low Risk  (12/04/2021)  Depression (PHQ2-9): Low Risk  (06/16/2022)  Recent Concern: Depression (PHQ2-9) - Medium Risk (06/01/2022)  Financial Resource Strain: Low Risk  (12/11/2021)  Physical Activity: Inactive (06/01/2022)  Social Connections: Moderately Isolated (12/11/2021)  Stress: No Stress Concern Present (06/01/2022)  Tobacco Use: Medium Risk (12/27/2022)    Readmission Risk Interventions     No data to display

## 2022-12-30 NOTE — Progress Notes (Signed)
[facility] Progress Note   Subjective: Seen in HD. Unfortunately AVF infiltrated. Will return to room and attempt HD again this afternoon or possibly early AM. K+ 4.2  Objective Vitals:   12/30/22 0529 12/30/22 0722 12/30/22 0745 12/30/22 0900  BP: (!) 93/55 (!) 131/59 (!) 118/47   Pulse: 78 75 79   Resp: 18 (!) 35 (!) 27   Temp: 98.6 F (37 C) 98 F (36.7 C)    TempSrc:      SpO2: 91%  (!) 89%   Weight:  87.1 kg  87.1 kg  Height:       Physical Exam General: Chronically ill appearing patient in pain at present HEENT: ACE wrap around head. Bruising noted L eye.  Heart: SR on Monitor. Rate 70-80s. S1.S2 no M/R/G Lungs: CTAB RR 24 shallow.  Abdomen:NABS, NT Extremities:No LE edema Dialysis Access: L AVF+ T/B with infiltration of upper portion on AVF  Additional Objective Labs: Basic Metabolic Panel: Recent Labs  Lab 12/29/22 0544 12/29/22 1211 12/30/22 0538  NA 134* 135 133*  K 4.1 3.9 4.2  CL 97* 98 100  CO2 27 26 26   GLUCOSE 275* 235* 339*  BUN 16 18 30*  CREATININE 3.79* 3.96* 4.64*  CALCIUM 9.1 9.2 9.3  PHOS  --  4.6 5.7*   Liver Function Tests: Recent Labs  Lab 12/27/22 0412 12/28/22 0516 12/29/22 1211 12/30/22 0538  AST 13* 11*  --   --   ALT 12 9  --   --   ALKPHOS 83 72  --   --   BILITOT 0.5 0.2  --   --   PROT 6.1* 5.7*  --   --   ALBUMIN 2.9* 2.7* 2.8* 2.7*   No results for input(s): "LIPASE", "AMYLASE" in the last 168 hours. CBC: Recent Labs  Lab 12/27/22 0412 12/27/22 0421 12/28/22 0516 12/29/22 0544 12/30/22 0538  WBC 11.6*  --  14.8* 11.6* 11.2*  HGB 10.5*   < > 8.4* 8.4* 8.5*  HCT 35.6*   < > 28.6* 29.2* 28.2*  MCV 91.8  --  91.4 90.7 89.0  PLT 488*  --  455* 406* 398   < > = values in this interval not displayed.   Blood Culture No results found for: "SDES", "SPECREQUEST", "CULT", "REPTSTATUS"  Cardiac Enzymes: No results for input(s): "CKTOTAL", "CKMB", "CKMBINDEX", "TROPONINI" in the last 168  hours. CBG: Recent Labs  Lab 12/28/22 2212 12/29/22 0803 12/29/22 1200 12/29/22 1640 12/29/22 2110  GLUCAP 165* 265* 212* 255* 255*   Iron Studies: No results for input(s): "IRON", "TIBC", "TRANSFERRIN", "FERRITIN" in the last 72 hours. @lablastinr3 @ Studies/Results: No results found. Medications:   (feeding supplement) PROSource Plus  30 mL Oral BID BM   arformoterol  15 mcg Nebulization BID   And   umeclidinium bromide  1 puff Inhalation Daily   ascorbic acid  250 mg Oral Daily   atorvastatin  40 mg Oral Daily   buPROPion  150 mg Oral BID   calcitRIOL  0.25 mcg Oral Daily   cephALEXin  500 mg Oral Q12H   Chlorhexidine Gluconate Cloth  6 each Topical Q0600   gabapentin  300 mg Oral Daily   insulin aspart  0-6 Units Subcutaneous TID WC   insulin aspart  10 Units Subcutaneous TID WC   insulin glargine-yfgn  25 Units Subcutaneous Daily   multivitamin  1 tablet Oral QHS   PARoxetine  40 mg Oral Daily   sodium chloride flush  3  mL Intravenous Q12H     OP HD:  [facility]  TTS  4h  88kg   LUA AVF   Heparin 1500 bolus x 1      Na 134  K 3.8  BUN 23   creat 4.21   Ca 9.6  alb 2.9   Hb 10.5  wbc 11K      Assessment/ Plan: SP fall - w/ traumatic laceration to forehead, skin tears on the arms. Went to OR 11/18 for complex facial laceration degloving repair per ENT.  ESKD - on HD TTS. HD 12/30/2022 attempted however AVF infiltrated. AVF iced, will attempt HD later this afternoon or early 12/31/2022.  HTN - BP's stable here, not on BP lowering meds at home if PTA meds are accurate. BP appears volume controlled.  Volume - no gross volume overload. 1kg up by wts. No gross vol overload on exam. UF 2-2.5 L w/ HD today.  Anemia of eskd -HGB down to 8.5 today. Check iron panel with HD tomorrow.  MBD ckd - CCa in range. Add on phos. Follow.  Nutrition- Low albumin. Renal diet with protein supplements. Renal vitamin. Vitamin C for wound healing.   Aleysha Meckler H. Cherylynn Liszewski  NP-C 12/30/2022, 9:04 AM  [employer] [phone]

## 2022-12-31 DIAGNOSIS — Z8673 Personal history of transient ischemic attack (TIA), and cerebral infarction without residual deficits: Secondary | ICD-10-CM | POA: Diagnosis not present

## 2022-12-31 DIAGNOSIS — R41841 Cognitive communication deficit: Secondary | ICD-10-CM | POA: Diagnosis not present

## 2022-12-31 DIAGNOSIS — Z86718 Personal history of other venous thrombosis and embolism: Secondary | ICD-10-CM | POA: Diagnosis not present

## 2022-12-31 DIAGNOSIS — D631 Anemia in chronic kidney disease: Secondary | ICD-10-CM | POA: Diagnosis not present

## 2022-12-31 DIAGNOSIS — E785 Hyperlipidemia, unspecified: Secondary | ICD-10-CM | POA: Diagnosis not present

## 2022-12-31 DIAGNOSIS — R296 Repeated falls: Secondary | ICD-10-CM | POA: Diagnosis not present

## 2022-12-31 DIAGNOSIS — R1311 Dysphagia, oral phase: Secondary | ICD-10-CM | POA: Diagnosis not present

## 2022-12-31 DIAGNOSIS — S0101XD Laceration without foreign body of scalp, subsequent encounter: Secondary | ICD-10-CM | POA: Diagnosis not present

## 2022-12-31 DIAGNOSIS — J449 Chronic obstructive pulmonary disease, unspecified: Secondary | ICD-10-CM | POA: Diagnosis not present

## 2022-12-31 DIAGNOSIS — R2681 Unsteadiness on feet: Secondary | ICD-10-CM | POA: Diagnosis not present

## 2022-12-31 DIAGNOSIS — E569 Vitamin deficiency, unspecified: Secondary | ICD-10-CM | POA: Diagnosis not present

## 2022-12-31 DIAGNOSIS — M6259 Muscle wasting and atrophy, not elsewhere classified, multiple sites: Secondary | ICD-10-CM | POA: Diagnosis not present

## 2022-12-31 DIAGNOSIS — E1165 Type 2 diabetes mellitus with hyperglycemia: Secondary | ICD-10-CM | POA: Diagnosis not present

## 2022-12-31 DIAGNOSIS — I120 Hypertensive chronic kidney disease with stage 5 chronic kidney disease or end stage renal disease: Secondary | ICD-10-CM | POA: Diagnosis not present

## 2022-12-31 DIAGNOSIS — S0181XD Laceration without foreign body of other part of head, subsequent encounter: Secondary | ICD-10-CM | POA: Diagnosis not present

## 2022-12-31 DIAGNOSIS — F3289 Other specified depressive episodes: Secondary | ICD-10-CM | POA: Diagnosis not present

## 2022-12-31 DIAGNOSIS — M810 Age-related osteoporosis without current pathological fracture: Secondary | ICD-10-CM | POA: Diagnosis not present

## 2022-12-31 DIAGNOSIS — I5033 Acute on chronic diastolic (congestive) heart failure: Secondary | ICD-10-CM | POA: Diagnosis not present

## 2022-12-31 DIAGNOSIS — I10 Essential (primary) hypertension: Secondary | ICD-10-CM | POA: Diagnosis not present

## 2022-12-31 DIAGNOSIS — N186 End stage renal disease: Secondary | ICD-10-CM | POA: Diagnosis not present

## 2022-12-31 DIAGNOSIS — I739 Peripheral vascular disease, unspecified: Secondary | ICD-10-CM | POA: Diagnosis not present

## 2022-12-31 DIAGNOSIS — S0101XA Laceration without foreign body of scalp, initial encounter: Secondary | ICD-10-CM | POA: Diagnosis not present

## 2022-12-31 DIAGNOSIS — Z992 Dependence on renal dialysis: Secondary | ICD-10-CM | POA: Diagnosis not present

## 2022-12-31 DIAGNOSIS — S81011S Laceration without foreign body, right knee, sequela: Secondary | ICD-10-CM | POA: Diagnosis not present

## 2022-12-31 DIAGNOSIS — M488X2 Other specified spondylopathies, cervical region: Secondary | ICD-10-CM | POA: Diagnosis not present

## 2022-12-31 DIAGNOSIS — D509 Iron deficiency anemia, unspecified: Secondary | ICD-10-CM | POA: Diagnosis not present

## 2022-12-31 DIAGNOSIS — E1140 Type 2 diabetes mellitus with diabetic neuropathy, unspecified: Secondary | ICD-10-CM | POA: Diagnosis not present

## 2022-12-31 DIAGNOSIS — N250 Renal osteodystrophy: Secondary | ICD-10-CM | POA: Diagnosis not present

## 2022-12-31 DIAGNOSIS — W19XXXA Unspecified fall, initial encounter: Secondary | ICD-10-CM | POA: Diagnosis not present

## 2022-12-31 DIAGNOSIS — M952 Other acquired deformity of head: Secondary | ICD-10-CM | POA: Diagnosis not present

## 2022-12-31 LAB — GLUCOSE, CAPILLARY
Glucose-Capillary: 237 mg/dL — ABNORMAL HIGH (ref 70–99)
Glucose-Capillary: 302 mg/dL — ABNORMAL HIGH (ref 70–99)
Glucose-Capillary: 358 mg/dL — ABNORMAL HIGH (ref 70–99)

## 2022-12-31 LAB — RENAL FUNCTION PANEL
Albumin: 2.8 g/dL — ABNORMAL LOW (ref 3.5–5.0)
Anion gap: 8 (ref 5–15)
BUN: 19 mg/dL (ref 8–23)
CO2: 27 mmol/L (ref 22–32)
Calcium: 8.9 mg/dL (ref 8.9–10.3)
Chloride: 99 mmol/L (ref 98–111)
Creatinine, Ser: 2.87 mg/dL — ABNORMAL HIGH (ref 0.44–1.00)
GFR, Estimated: 17 mL/min — ABNORMAL LOW (ref >60)
Glucose, Bld: 315 mg/dL — ABNORMAL HIGH (ref 70–99)
Phosphorus: 3.2 mg/dL (ref 2.5–4.6)
Potassium: 3.5 mmol/L (ref 3.5–5.1)
Sodium: 134 mmol/L — ABNORMAL LOW (ref 135–145)

## 2022-12-31 MED ORDER — OXYCODONE HCL 5 MG PO TABS: 5.0000 mg | ORAL_TABLET | Freq: Four times a day (QID) | ORAL | 0 refills | Status: DC | PRN

## 2022-12-31 NOTE — Plan of Care (Signed)
Problem: Education: Goal: Ability to describe self-care measures that may prevent or decrease complications (Diabetes Survival Skills Education) will improve 12/31/2022 1036 by Osborn Coho, RN Outcome: Adequate for Discharge 12/31/2022 1036 by Osborn Coho, RN Outcome: Adequate for Discharge Goal: Individualized Educational Video(s) 12/31/2022 1036 by Osborn Coho, RN Outcome: Adequate for Discharge 12/31/2022 1036 by Osborn Coho, RN Outcome: Adequate for Discharge   Problem: Coping: Goal: Ability to adjust to condition or change in health will improve 12/31/2022 1036 by Osborn Coho, RN Outcome: Adequate for Discharge 12/31/2022 1036 by Osborn Coho, RN Outcome: Adequate for Discharge   Problem: Fluid Volume: Goal: Ability to maintain a balanced intake and output will improve 12/31/2022 1036 by Osborn Coho, RN Outcome: Adequate for Discharge 12/31/2022 1036 by Osborn Coho, RN Outcome: Adequate for Discharge   Problem: Health Behavior/Discharge Planning: Goal: Ability to identify and utilize available resources and services will improve 12/31/2022 1036 by Osborn Coho, RN Outcome: Adequate for Discharge 12/31/2022 1036 by Osborn Coho, RN Outcome: Adequate for Discharge Goal: Ability to manage health-related needs will improve 12/31/2022 1036 by Osborn Coho, RN Outcome: Adequate for Discharge 12/31/2022 1036 by Osborn Coho, RN Outcome: Adequate for Discharge   Problem: Metabolic: Goal: Ability to maintain appropriate glucose levels will improve 12/31/2022 1036 by Osborn Coho, RN Outcome: Adequate for Discharge 12/31/2022 1036 by Osborn Coho, RN Outcome: Adequate for Discharge   Problem: Nutritional: Goal: Maintenance of adequate nutrition will improve 12/31/2022 1036 by Osborn Coho, RN Outcome: Adequate for Discharge 12/31/2022 1036 by Osborn Coho, RN Outcome: Adequate for Discharge Goal: Progress toward achieving an optimal weight will improve 12/31/2022 1036 by Osborn Coho, RN Outcome: Adequate for Discharge 12/31/2022 1036 by Osborn Coho, RN Outcome: Adequate for Discharge   Problem: Skin Integrity: Goal: Risk for impaired skin integrity will decrease 12/31/2022 1036 by Osborn Coho, RN Outcome: Adequate for Discharge 12/31/2022 1036 by Osborn Coho, RN Outcome: Adequate for Discharge   Problem: Tissue Perfusion: Goal: Adequacy of tissue perfusion will improve 12/31/2022 1036 by Osborn Coho, RN Outcome: Adequate for Discharge 12/31/2022 1036 by Osborn Coho, RN Outcome: Adequate for Discharge   Problem: Education: Goal: Knowledge of General Education information will improve Description: Including pain rating scale, medication(s)/side effects and non-pharmacologic comfort measures 12/31/2022 1036 by Osborn Coho, RN Outcome: Adequate for Discharge 12/31/2022 1036 by Osborn Coho, RN Outcome: Adequate for Discharge   Problem: Health Behavior/Discharge Planning: Goal: Ability to manage health-related needs will improve 12/31/2022 1036 by Osborn Coho, RN Outcome: Adequate for Discharge 12/31/2022 1036 by Osborn Coho, RN Outcome: Adequate for Discharge   Problem: Clinical Measurements: Goal: Ability to maintain clinical measurements within normal limits will improve 12/31/2022 1036 by Osborn Coho, RN Outcome: Adequate for Discharge 12/31/2022 1036 by Osborn Coho, RN Outcome: Adequate for Discharge Goal: Will remain free from infection 12/31/2022 1036 by Osborn Coho, RN Outcome: Adequate for Discharge 12/31/2022 1036 by Osborn Coho, RN Outcome: Adequate for Discharge Goal: Diagnostic test results will improve 12/31/2022 1036 by Osborn Coho, RN Outcome: Adequate for Discharge 12/31/2022 1036 by  Osborn Coho, RN Outcome: Adequate for Discharge Goal: Respiratory complications will improve 12/31/2022 1036 by Osborn Coho, RN Outcome: Adequate for Discharge 12/31/2022 1036 by Osborn Coho, RN Outcome: Adequate for Discharge Goal: Cardiovascular complication will be avoided 12/31/2022 1036 by Osborn Coho, RN Outcome:  Adequate for Discharge 12/31/2022 1036 by Osborn Coho, RN Outcome: Adequate for Discharge   Problem: Activity: Goal: Risk for activity intolerance will decrease 12/31/2022 1036 by Osborn Coho, RN Outcome: Adequate for Discharge 12/31/2022 1036 by Osborn Coho, RN Outcome: Adequate for Discharge   Problem: Nutrition: Goal: Adequate nutrition will be maintained 12/31/2022 1036 by Osborn Coho, RN Outcome: Adequate for Discharge 12/31/2022 1036 by Osborn Coho, RN Outcome: Adequate for Discharge   Problem: Coping: Goal: Level of anxiety will decrease 12/31/2022 1036 by Osborn Coho, RN Outcome: Adequate for Discharge 12/31/2022 1036 by Osborn Coho, RN Outcome: Adequate for Discharge   Problem: Elimination: Goal: Will not experience complications related to bowel motility 12/31/2022 1036 by Osborn Coho, RN Outcome: Adequate for Discharge 12/31/2022 1036 by Osborn Coho, RN Outcome: Adequate for Discharge Goal: Will not experience complications related to urinary retention 12/31/2022 1036 by Osborn Coho, RN Outcome: Adequate for Discharge 12/31/2022 1036 by Osborn Coho, RN Outcome: Adequate for Discharge   Problem: Pain Management: Goal: General experience of comfort will improve 12/31/2022 1036 by Osborn Coho, RN Outcome: Adequate for Discharge 12/31/2022 1036 by Osborn Coho, RN Outcome: Adequate for Discharge   Problem: Safety: Goal: Ability to remain free from injury will improve 12/31/2022 1036 by Osborn Coho,  RN Outcome: Adequate for Discharge 12/31/2022 1036 by Osborn Coho, RN Outcome: Adequate for Discharge   Problem: Skin Integrity: Goal: Risk for impaired skin integrity will decrease 12/31/2022 1036 by Osborn Coho, RN Outcome: Adequate for Discharge 12/31/2022 1036 by Osborn Coho, RN Outcome: Adequate for Discharge

## 2022-12-31 NOTE — Plan of Care (Signed)

## 2022-12-31 NOTE — TOC Transition Note (Addendum)
Transition of Care [facility]) - CM/SW Discharge Note   Patient Details  Name: Jocelyn Wells MRN: [id number] Date of Birth: Nov 20, 1955  Transition of Care [facility]) CM/SW Contact:  Deatra Robinson, [place] Phone Number: 12/31/2022, 10:03 AM   Clinical Narrative: pt for dc to [facility] today. [facility] who confirmed they are prepared to admit pt to room 609. Tammy confirmed they are able to accommodate pt's HD clinic and schedule at [facility] on [facility] on TTS 10:15 chair time. Pt's spouse aware of dc and plans to transport pt by private vehicle. RN provided with number report. SW signing off at dc.   Dellie Burns, MSW, LCSW [phone] (coverage)         Final next level of care: Skilled Nursing Facility Barriers to Discharge: Barriers Resolved   Patient Goals and CMS Choice CMS Medicare.gov Compare Post Acute Care list provided to:: Patient Choice offered to / list presented to : Patient  Discharge Placement                Patient chooses bed at: [facility] [place] Patient to be transferred to facility by: Spouse to transport by private vehicle Name of family member notified: Steve/husband Patient and family notified of of transfer: 12/31/22  Discharge Plan and Services Additional resources added to the After Visit Summary for       Post Acute Care Choice: Skilled Nursing Facility                               Social Determinants of Health (SDOH) Interventions SDOH Screenings   Food Insecurity: No Food Insecurity (12/27/2022)  Housing: Low Risk  (12/27/2022)  Transportation Needs: No Transportation Needs (12/27/2022)  Utilities: Not At Risk (12/27/2022)  Alcohol Screen: Low Risk  (12/04/2021)  Depression (PHQ2-9): Low Risk  (06/16/2022)  Recent Concern: Depression (PHQ2-9) - Medium Risk (06/01/2022)  Financial Resource Strain: Low Risk  (12/11/2021)  Physical Activity: Inactive (06/01/2022)  Social Connections: Moderately  Isolated (12/11/2021)  Stress: No Stress Concern Present (06/01/2022)  Tobacco Use: Medium Risk (12/27/2022)     Readmission Risk Interventions     No data to display

## 2022-12-31 NOTE — Progress Notes (Signed)
Discharge orders received. Report given to [facility] at [facility]. Patient being transported by husband.

## 2022-12-31 NOTE — Discharge Summary (Addendum)
Physician Discharge Summary  Jocelyn Wells ZOX:[id number] DOB: [birth year] DOA: 12/27/2022  PCP: Jacky Kindle, FNP  Admit date: 12/27/2022 Discharge date: 12/31/2022  Admitted From: Home Disposition:  SNF  Recommendations for Outpatient Follow-up:  Follow up with PCP in 1-2 weeks Follow up with ENT for post-op care as scheduled: REMOVE L KNEE STITCHES 01/03/2023  Discharge Condition:Stable  CODE STATUS:Full  Diet recommendation:  As tolerated  Brief/Interim Summary: 67 year old female with IDDM, COPD with nocturnal oxygen, thrombocytosis, ESRD on HD who comes into the hospital after a fall at home and a scalp laceration.  She has recurrent falls, denies syncope but has been consistently not using her walker which is one of the reasons for why she falls.  Recently missed dialysis due to nausea and vomiting, and husband tells me that she sometimes gets weaker if she misses dialysis and that may have played a role.  She has had a large left anterior scalp laceration, ENT was consulted and she was taken to the OR for repair   Patient continues to advance with PT/OT but unable to return home safely at this time - plan to discharge to SNF for ongoing PT/OT and with hopeful disposition home thereafter. Follow up with ENT for post-op care as scheduled  Discharge Diagnoses:  Principal Problem:   Scalp laceration, initial encounter Active Problems:   Type 2 diabetes mellitus with other diabetic kidney complication (HCC)   Anxiety, generalized   Essential hypertension   History of stroke   COPD (chronic obstructive pulmonary disease) (HCC)   Chronic deep vein thrombosis (DVT) (HCC)   MDD (major depressive disorder), recurrent episode, mild (HCC)   ESRD on dialysis [facility])   Essential thrombocytosis (HCC)   Fall  Ambulatory dysfunction Mechanical fall Scalp laceration Dr. Merrilee Jansky with ENT consulted, patient was taken to the OR and she is status post laceration repair on 11/18.  ENT  recommends antibiotics for 5 days and keep pressure dressing in place for a week up until she is seen as an outpatient next week.  Okay per ENT to resume Eliquis PT recommending SNF - patient is agreeable. TOC consulted.     COPD, chronic hypoxia-no wheezing, continue home medications Essential thrombocytosis-continue home medications on discharge  ESRD-nephrology consulted, she was dialyzed while here History of DVT-okay to resume Eliquis per ENT, will start tomorrow. Prior CVA-continue Lipitor, Eliquis Anemia due to chronic kidney disease-monitor hemoglobin IDDM -un controlled, with hyperglycemia, A1c 11.9.  She is not using insulin pump at home since it broke down and they will receive a pump in 1-2 days. For now keep on glargine + SSI  Discharge Instructions   Allergies as of 12/31/2022       Reactions   Codeine Nausea And Vomiting   Ivp Dye [iodinated Contrast Media]    Patients states the IV Dye shuts her kidneys down        Medication List     STOP taking these medications    traMADol 50 MG tablet Commonly known as: ULTRAM       TAKE these medications    albuterol 108 (90 Base) MCG/ACT inhaler Commonly known as: VENTOLIN HFA TAKE 2 PUFFS BY MOUTH EVERY 6 HOURS AS NEEDED FOR WHEEZE OR SHORTNESS OF BREATH What changed: See the new instructions.   apixaban 5 MG Tabs tablet Commonly known as: Eliquis Take 1 tablet (5 mg total) by mouth 2 (two) times daily.   atorvastatin 40 MG tablet Commonly known as: LIPITOR Take 1 tablet (40  mg total) by mouth daily.   buPROPion 150 MG 12 hr tablet Commonly known as: WELLBUTRIN SR TAKE 1 TABLET BY MOUTH TWICE A DAY   calcitRIOL 0.25 MCG capsule Commonly known as: ROCALTROL Take 0.25 mcg by mouth daily.   cephALEXin 500 MG capsule Commonly known as: KEFLEX Take 1 capsule (500 mg total) by mouth every 12 (twelve) hours for 5 days.   fluticasone 50 MCG/ACT nasal spray Commonly known as: FLONASE Place 2 sprays into  both nostrils daily. What changed:  when to take this reasons to take this   gabapentin 300 MG capsule Commonly known as: NEURONTIN Take 300 mg by mouth daily.   glucose blood test strip   hydroxyurea 500 MG capsule Commonly known as: Hydrea Take 1 capsule (500 mg total) by mouth every other day. May take with food to minimize GI side effects.   insulin aspart 100 UNIT/ML injection Commonly known as: novoLOG Insulin pump   insulin pump Soln Per endocrinoloy   loratadine 10 MG tablet Commonly known as: CLARITIN Take 1 tablet by mouth daily.   meclizine 12.5 MG tablet Commonly known as: ANTIVERT Take 1 tablet (12.5 mg total) by mouth 3 (three) times daily as needed for dizziness.   Omnipod DASH Pods (Gen 4) Misc USE 1 POD EACH EVERY 72    HOURS   optichamber diamond Misc Use with inhaler to ensure medication is delivered throughout lungs   oxyCODONE 5 MG immediate release tablet Commonly known as: Oxy IR/ROXICODONE Take 1 tablet (5 mg total) by mouth every 6 (six) hours as needed for moderate pain (pain score 4-6).   OXYGEN Place 2 L into the nose continuous.   PARoxetine 40 MG tablet Commonly known as: PAXIL Take 1 tablet (40 mg total) by mouth daily.   Stiolto Respimat 2.5-2.5 MCG/ACT Aers Generic drug: Tiotropium Bromide-Olodaterol Inhale 2 puffs into the lungs daily.   torsemide 20 MG tablet Commonly known as: DEMADEX TAKE 3 TABLETS BY MOUTH EVERY DAY What changed:  how much to take how to take this when to take this additional instructions   Trulicity 4.5 MG/0.5ML Soaj Generic drug: Dulaglutide Inject 4.5 mg into the skin every 7 (seven) days. What changed: additional instructions   Vitamin D (Ergocalciferol) 1.25 MG (50000 UNIT) Caps capsule Commonly known as: DRISDOL Take 50,000 Units by mouth every 7 (seven) days. Sunday               Durable Medical Equipment  (From admission, onward)           Start     Ordered   12/28/22  1449  For home use only DME Walker  Once       Question:  Patient needs a walker to treat with the following condition  Answer:  Blind   12/28/22 1449            Contact information for after-discharge care     Destination     [facility] Preferred SNF .   Service: Skilled Nursing Contact information: [address] [place] [address] [phone]                    Allergies  Allergen Reactions   Codeine Nausea And Vomiting   Ivp Dye [Iodinated Contrast Media]     Patients states the IV Dye shuts her kidneys down    Consultations: ENT   Procedures/Studies: CT HEAD WO CONTRAST  Result Date: 12/27/2022 CLINICAL DATA:  Fall  when walking dog with forehead laceration. EXAM: CT HEAD WITHOUT CONTRAST CT MAXILLOFACIAL WITHOUT CONTRAST CT CERVICAL SPINE WITHOUT CONTRAST TECHNIQUE: Multidetector CT imaging of the head, cervical spine, and maxillofacial structures were performed using the standard protocol without intravenous contrast. Multiplanar CT image reconstructions of the cervical spine and maxillofacial structures were also generated. RADIATION DOSE REDUCTION: This exam was performed according to the departmental dose-optimization program which includes automated exposure control, adjustment of the mA and/or kV according to patient size and/or use of iterative reconstruction technique. COMPARISON:  09/19/2017 head CT FINDINGS: CT HEAD FINDINGS Brain: No evidence of acute infarction, hemorrhage, hydrocephalus, extra-axial collection or mass lesion/mass effect. Brain atrophy most pronounced in the biparietal region. Vascular: No hyperdense vessel or unexpected calcification. Skull: Deep laceration to the left forehead with undermining and flap of scalp folded laterally. Gas nearly reaches the bone. No underlying fracture or opaque foreign body. CT MAXILLOFACIAL FINDINGS Osseous: No acute fracture or mandibular dislocation. Orbits: No  evidence of injury to the postseptal structures Sinuses: Left maxillary sinusitis with debris like appearance and partial mineralization. There is associated sclerotic wall thickening and bulging of the medial wall. Soft tissues: Forehead injury as above. CT CERVICAL SPINE FINDINGS Alignment: Normal. Skull base and vertebrae: No acute fracture. No primary bone lesion or focal pathologic process. Soft tissues and spinal canal: No prevertebral fluid or swelling. No visible canal hematoma. Disc levels:  Cervical spine degeneration which is mild for age. Upper chest: No evidence of injury IMPRESSION: 1. Extensive left anterior scalp laceration without underlying fracture. 2. No evidence of intracranial or cervical spine injury. 3. Chronic left maxillary sinusitis. Electronically Signed   By: Tiburcio Pea M.D.   On: 12/27/2022 04:46   CT MAXILLOFACIAL WO CONTRAST  Result Date: 12/27/2022 CLINICAL DATA:  Fall when walking dog with forehead laceration. EXAM: CT HEAD WITHOUT CONTRAST CT MAXILLOFACIAL WITHOUT CONTRAST CT CERVICAL SPINE WITHOUT CONTRAST TECHNIQUE: Multidetector CT imaging of the head, cervical spine, and maxillofacial structures were performed using the standard protocol without intravenous contrast. Multiplanar CT image reconstructions of the cervical spine and maxillofacial structures were also generated. RADIATION DOSE REDUCTION: This exam was performed according to the departmental dose-optimization program which includes automated exposure control, adjustment of the mA and/or kV according to patient size and/or use of iterative reconstruction technique. COMPARISON:  09/19/2017 head CT FINDINGS: CT HEAD FINDINGS Brain: No evidence of acute infarction, hemorrhage, hydrocephalus, extra-axial collection or mass lesion/mass effect. Brain atrophy most pronounced in the biparietal region. Vascular: No hyperdense vessel or unexpected calcification. Skull: Deep laceration to the left forehead with  undermining and flap of scalp folded laterally. Gas nearly reaches the bone. No underlying fracture or opaque foreign body. CT MAXILLOFACIAL FINDINGS Osseous: No acute fracture or mandibular dislocation. Orbits: No evidence of injury to the postseptal structures Sinuses: Left maxillary sinusitis with debris like appearance and partial mineralization. There is associated sclerotic wall thickening and bulging of the medial wall. Soft tissues: Forehead injury as above. CT CERVICAL SPINE FINDINGS Alignment: Normal. Skull base and vertebrae: No acute fracture. No primary bone lesion or focal pathologic process. Soft tissues and spinal canal: No prevertebral fluid or swelling. No visible canal hematoma. Disc levels:  Cervical spine degeneration which is mild for age. Upper chest: No evidence of injury IMPRESSION: 1. Extensive left anterior scalp laceration without underlying fracture. 2. No evidence of intracranial or cervical spine injury. 3. Chronic left maxillary sinusitis. Electronically Signed   By: Tiburcio Pea M.D.   On: 12/27/2022 04:46  CT CERVICAL SPINE WO CONTRAST  Result Date: 12/27/2022 CLINICAL DATA:  Fall when walking dog with forehead laceration. EXAM: CT HEAD WITHOUT CONTRAST CT MAXILLOFACIAL WITHOUT CONTRAST CT CERVICAL SPINE WITHOUT CONTRAST TECHNIQUE: Multidetector CT imaging of the head, cervical spine, and maxillofacial structures were performed using the standard protocol without intravenous contrast. Multiplanar CT image reconstructions of the cervical spine and maxillofacial structures were also generated. RADIATION DOSE REDUCTION: This exam was performed according to the departmental dose-optimization program which includes automated exposure control, adjustment of the mA and/or kV according to patient size and/or use of iterative reconstruction technique. COMPARISON:  09/19/2017 head CT FINDINGS: CT HEAD FINDINGS Brain: No evidence of acute infarction, hemorrhage, hydrocephalus,  extra-axial collection or mass lesion/mass effect. Brain atrophy most pronounced in the biparietal region. Vascular: No hyperdense vessel or unexpected calcification. Skull: Deep laceration to the left forehead with undermining and flap of scalp folded laterally. Gas nearly reaches the bone. No underlying fracture or opaque foreign body. CT MAXILLOFACIAL FINDINGS Osseous: No acute fracture or mandibular dislocation. Orbits: No evidence of injury to the postseptal structures Sinuses: Left maxillary sinusitis with debris like appearance and partial mineralization. There is associated sclerotic wall thickening and bulging of the medial wall. Soft tissues: Forehead injury as above. CT CERVICAL SPINE FINDINGS Alignment: Normal. Skull base and vertebrae: No acute fracture. No primary bone lesion or focal pathologic process. Soft tissues and spinal canal: No prevertebral fluid or swelling. No visible canal hematoma. Disc levels:  Cervical spine degeneration which is mild for age. Upper chest: No evidence of injury IMPRESSION: 1. Extensive left anterior scalp laceration without underlying fracture. 2. No evidence of intracranial or cervical spine injury. 3. Chronic left maxillary sinusitis. Electronically Signed   By: Tiburcio Pea M.D.   On: 12/27/2022 04:46     Subjective: No acute issues/events overnight   Discharge Exam: Vitals:   12/31/22 0227 12/31/22 0549  BP: (!) 132/59 (!) 130/59  Pulse: 73 78  Resp: 18 18  Temp: 98.4 F (36.9 C) 98.3 F (36.8 C)  SpO2: 90% (!) 86%   Vitals:   12/31/22 0150 12/31/22 0227 12/31/22 0500 12/31/22 0549  BP:  (!) 132/59  (!) 130/59  Pulse: 73 73  78  Resp: (!) 29 18  18   Temp:  98.4 F (36.9 C)  98.3 F (36.8 C)  TempSrc:      SpO2: 98% 90%  (!) 86%  Weight:   87.1 kg   Height:       General: Pt is alert, awake, not in acute distress HEENT: Notable ecchymosis over left eye - postop bandages clead/dry/intact Cardiovascular: RRR, S1/S2 +, no rubs, no  gallops Respiratory: CTA bilaterally, no wheezing, no rhonchi Abdominal: Soft, NT, ND, bowel sounds + Extremities: no edema, no cyanosis   The results of significant diagnostics from this hospitalization (including imaging, microbiology, ancillary and laboratory) are listed below for reference.     Microbiology: No results found for this or any previous visit (from the past 240 hour(s)).   Labs: BNP (last 3 results) Recent Labs    01/01/22 1730 01/26/22 1106 02/11/22 1137  BNP 2,508.4* 2,195.7* 735.3*   Basic Metabolic Panel: Recent Labs  Lab 12/28/22 0516 12/29/22 0544 12/29/22 1211 12/30/22 0538 12/31/22 0505  NA 136 134* 135 133* 134*  K 4.0 4.1 3.9 4.2 3.5  CL 102 97* 98 100 99  CO2 26 27 26 26 27   GLUCOSE 284* 275* 235* 339* 315*  BUN 27* 16 18 30* 19  CREATININE 4.80* 3.79* 3.96* 4.64* 2.87*  CALCIUM 9.2 9.1 9.2 9.3 8.9  MG 1.8  --   --   --   --   PHOS  --   --  4.6 5.7* 3.2   Liver Function Tests: Recent Labs  Lab 12/27/22 0412 12/28/22 0516 12/29/22 1211 12/30/22 0538 12/31/22 0505  AST 13* 11*  --   --   --   ALT 12 9  --   --   --   ALKPHOS 83 72  --   --   --   BILITOT 0.5 0.2  --   --   --   PROT 6.1* 5.7*  --   --   --   ALBUMIN 2.9* 2.7* 2.8* 2.7* 2.8*   CBC: Recent Labs  Lab 12/27/22 0412 12/27/22 0421 12/28/22 0516 12/29/22 0544 12/30/22 0538  WBC 11.6*  --  14.8* 11.6* 11.2*  HGB 10.5* 11.9* 8.4* 8.4* 8.5*  HCT 35.6* 35.0* 28.6* 29.2* 28.2*  MCV 91.8  --  91.4 90.7 89.0  PLT 488*  --  455* 406* 398   CBG: Recent Labs  Lab 12/30/22 0933 12/30/22 1202 12/30/22 1533 12/31/22 0229 12/31/22 0726  GLUCAP 303* 261* 119* 237* 302*   Urinalysis    Component Value Date/Time   COLORURINE YELLOW (A) 01/01/2022 2105   APPEARANCEUR HAZY (A) 01/01/2022 2105   LABSPEC 1.017 01/01/2022 2105   PHURINE 5.0 01/01/2022 2105   GLUCOSEU 50 (A) 01/01/2022 2105   HGBUR SMALL (A) 01/01/2022 2105   BILIRUBINUR NEGATIVE 01/01/2022 2105    KETONESUR NEGATIVE 01/01/2022 2105   PROTEINUR >=300 (A) 01/01/2022 2105   NITRITE NEGATIVE 01/01/2022 2105   LEUKOCYTESUR NEGATIVE 01/01/2022 2105   Sepsis Labs Recent Labs  Lab 12/27/22 0412 12/28/22 0516 12/29/22 0544 12/30/22 0538  WBC 11.6* 14.8* 11.6* 11.2*   Time coordinating discharge: Over 30 minutes  SIGNED:  Azucena Fallen, DO [facility] 12/31/2022, 7:39 AM Pager   If 7PM-7AM, please contact night-coverage [email or web address]

## 2022-12-31 NOTE — Progress Notes (Signed)
[facility] Progress Note   Subjective:   Appears she is planned for discharge today. Reports her AVF is a little bit sore after infiltration yesterday. Denies SOB, CP, dizziness.   Objective Vitals:   12/31/22 0549 12/31/22 0835 12/31/22 0836 12/31/22 0912  BP: (!) 130/59   135/60  Pulse: 78   81  Resp: 18   18  Temp: 98.3 F (36.8 C)   98 F (36.7 C)  TempSrc:    Oral  SpO2: (!) 86% 92% 93% 97%  Weight:      Height:       Physical Exam General: Alert female, bandages on head, NAD Heart: RRR, no murmurs, rubs or gallops Lungs: CTA bilaterally, respirations unlabored Abdomen: Soft, non-distended, +BS Extremities: No edema b/l lower extremities Dialysis Access:  LUE AVF + t/b, mild edema upper portion  Additional Objective Labs: Basic Metabolic Panel: Recent Labs  Lab 12/29/22 1211 12/30/22 0538 12/31/22 0505  NA 135 133* 134*  K 3.9 4.2 3.5  CL 98 100 99  CO2 26 26 27   GLUCOSE 235* 339* 315*  BUN 18 30* 19  CREATININE 3.96* 4.64* 2.87*  CALCIUM 9.2 9.3 8.9  PHOS 4.6 5.7* 3.2   Liver Function Tests: Recent Labs  Lab 12/27/22 0412 12/28/22 0516 12/29/22 1211 12/30/22 0538 12/31/22 0505  AST 13* 11*  --   --   --   ALT 12 9  --   --   --   ALKPHOS 83 72  --   --   --   BILITOT 0.5 0.2  --   --   --   PROT 6.1* 5.7*  --   --   --   ALBUMIN 2.9* 2.7* 2.8* 2.7* 2.8*   No results for input(s): "LIPASE", "AMYLASE" in the last 168 hours. CBC: Recent Labs  Lab 12/27/22 0412 12/27/22 0421 12/28/22 0516 12/29/22 0544 12/30/22 0538  WBC 11.6*  --  14.8* 11.6* 11.2*  HGB 10.5*   < > 8.4* 8.4* 8.5*  HCT 35.6*   < > 28.6* 29.2* 28.2*  MCV 91.8  --  91.4 90.7 89.0  PLT 488*  --  455* 406* 398   < > = values in this interval not displayed.   Blood Culture No results found for: "SDES", "SPECREQUEST", "CULT", "REPTSTATUS"  Cardiac Enzymes: No results for input(s): "CKTOTAL", "CKMB", "CKMBINDEX", "TROPONINI" in the last 168  hours. CBG: Recent Labs  Lab 12/30/22 0933 12/30/22 1202 12/30/22 1533 12/31/22 0229 12/31/22 0726  GLUCAP 303* 261* 119* 237* 302*   Iron Studies: No results for input(s): "IRON", "TIBC", "TRANSFERRIN", "FERRITIN" in the last 72 hours. @lablastinr3 @ Studies/Results: No results found. Medications:   (feeding supplement) PROSource Plus  30 mL Oral BID BM   arformoterol  15 mcg Nebulization BID   And   umeclidinium bromide  1 puff Inhalation Daily   ascorbic acid  250 mg Oral Daily   atorvastatin  40 mg Oral Daily   buPROPion  150 mg Oral BID   calcitRIOL  0.25 mcg Oral Daily   cephALEXin  500 mg Oral Q12H   Chlorhexidine Gluconate Cloth  6 each Topical Q0600   gabapentin  300 mg Oral Daily   insulin aspart  0-6 Units Subcutaneous TID WC   insulin aspart  10 Units Subcutaneous TID WC   insulin glargine-yfgn  25 Units Subcutaneous Daily   multivitamin  1 tablet Oral QHS   PARoxetine  40 mg Oral Daily   sodium chloride flush  3 mL Intravenous Q12H    Dialysis Orders: [facility]  TTS  4h  88kg   LUA AVF   Heparin 1500 bolus x 1      Na 134  K 3.8  BUN 23   creat 4.21   Ca 9.6  alb 2.9   Hb 10.5  wbc 11K   Assessment/Plan: SP fall - w/ traumatic laceration to forehead, skin tears on the arms. Went to OR 11/18 for complex facial laceration degloving repair per ENT.  2. ESKD - on HD TTS. HD 12/30/2022 attempted however AVF infiltrated, were able to cannulate and run HD after resting AVF for a few hours. Next HD tomorrow. Staying at her same outpatient HD unit at discharge.  3. HTN - BP's stable here, no antihypertensives on med list.   4. Volume - no gross volume overload. Close to her outpatient EDW.  5. Anemia of eskd -HGB down to 8.5 today. Check iron panel with HD tomorrow.  6. MBD ckd - CCa in range. Phos close to goal. Follow.  7./ Nutrition- Low albumin. Renal diet with protein supplements. Renal vitamin.   Rogers Blocker, PA-C 12/31/2022, 10:13  AM  [facility] Pager: [phone]

## 2022-12-31 NOTE — Progress Notes (Signed)
Contacted [facility] to advise staff of pt's d/c to snf today and that pt should resume care tomorrow. D/C summary and today's renal note faxed to clinic for continuation of care. HD schedule placed on AVS as well.   Olivia Canter Renal Navigator [phone]

## 2022-12-31 NOTE — Care Management Important Message (Signed)
Important Message  Patient Details  Name: Jocelyn Wells MRN: [id number] Date of Birth: 1955/02/21   Important Message Given:  Yes - Medicare IM  Patient left prior to IM delivery will mail a copy to the patient home address.    [name] 12/31/2022, 3:07 PM

## 2023-01-01 DIAGNOSIS — D631 Anemia in chronic kidney disease: Secondary | ICD-10-CM | POA: Diagnosis not present

## 2023-01-01 DIAGNOSIS — N186 End stage renal disease: Secondary | ICD-10-CM | POA: Diagnosis not present

## 2023-01-01 DIAGNOSIS — Z992 Dependence on renal dialysis: Secondary | ICD-10-CM | POA: Diagnosis not present

## 2023-01-01 DIAGNOSIS — N250 Renal osteodystrophy: Secondary | ICD-10-CM | POA: Diagnosis not present

## 2023-01-01 DIAGNOSIS — D509 Iron deficiency anemia, unspecified: Secondary | ICD-10-CM | POA: Diagnosis not present

## 2023-01-02 ENCOUNTER — Other Ambulatory Visit: Payer: Self-pay | Admitting: Oncology

## 2023-01-03 ENCOUNTER — Encounter: Payer: Self-pay | Admitting: Oncology

## 2023-01-03 DIAGNOSIS — S81011S Laceration without foreign body, right knee, sequela: Secondary | ICD-10-CM | POA: Diagnosis not present

## 2023-01-03 DIAGNOSIS — M952 Other acquired deformity of head: Secondary | ICD-10-CM | POA: Diagnosis not present

## 2023-01-03 DIAGNOSIS — S0181XD Laceration without foreign body of other part of head, subsequent encounter: Secondary | ICD-10-CM | POA: Diagnosis not present

## 2023-01-03 NOTE — Telephone Encounter (Signed)
CBC Order: [id number] Status: Final result     Visible to patient: Yes (not seen)     Next appt: 01/04/2023 at 11:30 AM in No Specialty ([facility] Powers, [facility])   0 Result Notes          Component Ref Range & Units 4 d ago (12/30/22) 5 d ago (12/29/22) 6 d ago (12/28/22) 7 d ago (12/27/22) 7 d ago (12/27/22) 3 wk ago (12/08/22) 2 mo ago (11/03/22)  WBC 4.0 - 10.5 K/uL 11.2 High  11.6 High  14.8 High   11.6 High  14.8 High  7.4  RBC 3.87 - 5.11 MIL/uL 3.17 Low  3.22 Low  3.13 Low   3.88 4.15 4.10  Hemoglobin 12.0 - 15.0 g/dL 8.5 Low  8.4 Low  8.4 Low  11.9 Low  10.5 Low  11.1 Low  10.5 Low   HCT 36.0 - 46.0 % 28.2 Low  29.2 Low  28.6 Low  35.0 Low  35.6 Low  37.3 34.6 Low   MCV 80.0 - 100.0 fL 89.0 90.7 91.4  91.8 89.9 84.4  MCH 26.0 - 34.0 pg 26.8 26.1 26.8  27.1 26.7 25.6 Low   MCHC 30.0 - 36.0 g/dL 40.9 81.1 Low  91.4 Low   29.5 Low  29.8 Low  30.3  RDW 11.5 - 15.5 % 18.7 High  18.7 High  18.7 High   18.2 High  19.0 High  18.3 High   Platelets 150 - 400 K/uL 398 406 High  CM 455 High  CM  488 High  CM 328 367  Comment: REPEATED TO VERIFY  nRBC 0.0 - 0.2 % 0.0 0.0 CM 0.0 CM  0.0 CM 0.0 0.0  Comment: Performed at [facility], [address] [place], [place] [address]  Sodium    137 R     Neutro Abs      12.8 High  R 5.8 R  Lymphocytes Relative      6 R 11 R  Lymphs Abs      0.8 R 0.8 R  Monocytes Relative      6 R 7 R  Monocytes Absolute      0.9 R 0.5 R  Eosinophils Relative      1 R 3 R  Eosinophils Absolute      0.1 R 0.2 R  Basophils Relative      1 R 1 R  Basophils Absolute      0.1 R 0.1 R  Immature Granulocytes      1 R 0 R  Potassium    3.9 R     Abs Immature Granulocytes      0.10 High  R, CM 0.03 R, CM  Chloride    99 R     BUN    27 High  R     Creatinine, Ser    4.40 High  R     Glucose, Bld    362 High  R, CM     Calcium, Ion    1.14 Low  R     TCO2    25 R     Neutrophils Relative %      85 R 78 R  Resulting Agency [facility]  [facility]         Specimen Collected: 12/30/22 05:38 Last Resulted: 12/30/22 06:19    Renal function panel Order: [id number] Status:  Final result     Visible to patient: Yes (not seen)     Next appt: 01/04/2023 at 11:30 AM in No Specialty ([facility]aven M Powers, [facility])   0 Result Notes  important suggestion  Newer results are available. Click to view them now.            Component Ref Range & Units 5 d ago (12/29/22) 5 d ago (12/29/22) 6 d ago (12/28/22) 7 d ago (12/27/22) 7 d ago (12/27/22) 5 mo ago (07/27/22) 6 mo ago (06/18/22)  Sodium 135 - 145 mmol/L 135 134 Low  136 137 134 Low  140 140  Potassium 3.5 - 5.1 mmol/L 3.9 4.1 4.0 3.9 3.8 3.8 4.2  Chloride 98 - 111 mmol/L 98 97 Low  102 99 98 108 106  CO2 22 - 32 mmol/L 26 27 26  23 22    Glucose, Bld 70 - 99 mg/dL 657 High  846 High  CM 284 High  CM 362 High  CM 360 High  CM 126 High  CM 209 High  CM  Comment: Glucose reference range applies only to samples taken after fasting for at least 8 hours.  BUN 8 - 23 mg/dL 18 16 27  High  27 High  23 47 High  49 High   Creatinine, Ser 0.44 - 1.00 mg/dL 9.62 High  9.52 High  8.41 High  4.40 High  4.21 High  3.11 High  3.60 High   Calcium 8.9 - 10.3 mg/dL 9.2 9.1 9.2  9.6 7.1 Low    Phosphorus 2.5 - 4.6 mg/dL 4.6        Albumin 3.5 - 5.0 g/dL 2.8 Low   2.7 Low   2.9 Low     GFR, Estimated >60 mL/min 12 Low  12 Low  CM 9 Low  CM  11 Low  CM 16 Low  CM   Comment: (NOTE) Calculated using the CKD-EPI Creatinine Equation (2021)  Anion gap 5 - 15 11 10  CM 8 CM  13 CM 10 CM   Comment: Performed at [facility], [address] [place], [place] [address]  Resulting Agency [facility]         Specimen Collected: 12/29/22 12:11 Last Resulted: 12/29/22 13:08

## 2023-01-04 ENCOUNTER — Other Ambulatory Visit: Payer: Self-pay | Admitting: Pharmacist

## 2023-01-04 DIAGNOSIS — R296 Repeated falls: Secondary | ICD-10-CM | POA: Diagnosis not present

## 2023-01-04 DIAGNOSIS — I10 Essential (primary) hypertension: Secondary | ICD-10-CM | POA: Diagnosis not present

## 2023-01-04 DIAGNOSIS — I5033 Acute on chronic diastolic (congestive) heart failure: Secondary | ICD-10-CM | POA: Diagnosis not present

## 2023-01-04 DIAGNOSIS — N186 End stage renal disease: Secondary | ICD-10-CM | POA: Diagnosis not present

## 2023-01-04 DIAGNOSIS — D509 Iron deficiency anemia, unspecified: Secondary | ICD-10-CM | POA: Diagnosis not present

## 2023-01-04 DIAGNOSIS — Z992 Dependence on renal dialysis: Secondary | ICD-10-CM | POA: Diagnosis not present

## 2023-01-04 DIAGNOSIS — N250 Renal osteodystrophy: Secondary | ICD-10-CM | POA: Diagnosis not present

## 2023-01-04 DIAGNOSIS — D631 Anemia in chronic kidney disease: Secondary | ICD-10-CM | POA: Diagnosis not present

## 2023-01-04 NOTE — Progress Notes (Signed)
01/04/2023 Name: Jocelyn Wells MRN: [id number] DOB: 1955-08-31  Chief Complaint  Patient presents with   Diabetes    Jocelyn Wells is a 67 y.o. year old female who presented for a telephone visit.   They were referred to the pharmacist by  [facility] prior  for assistance in managing diabetes.   Recent fall on 11/18 leading to hospitalization causing stitches on both her face and knee- not using walker as advised   Subjective:  Care Team: Primary Care Provider: Jacky Kindle, FNP ; Next Scheduled Visit: None- due in April 2025 Clinical Pharmacist: Jocelyn Wells, PharmD  Medication Access/Adherence  [facility] [address] [place] - [address] [place] [address] Phone: [phone] Fax: [phone]  [facility] [place], [place] - [facility] AT Portal to Registered [address] [place] [address] Phone: [phone] Fax: [phone]  [facility] [address] [place] [address] Phone: [phone] Fax: [phone]   Patient reports affordability concerns with their medications: No  Patient reports access/transportation concerns to their pharmacy: No  Patient reports adherence concerns with their medications:  No    No concerns expressed, however I do have concerns that Novolog has NOT been picked up since 05/09/22 for 90DS and Trulicity since 02/24/22 for 84DS   - Reports husband takes Trulicity as well and that he has been picking it up under his name and both using his supply- would NOT have a enough for 2 people, so this does NOT make sense   Diabetes:  Current medications: Novolog with Omnipod Dash Medications tried in the past: Jardiance 25mg  daily (lack of benefit), Lantus (64 U + Novolog 20U with supper- switched for better efficacy after inpatient stay in 2022). Ozempic (nausea), Glyburide/metformin (low  eGFR) *Limited options due to dialysis/ESRD  *Has a [employer] 2 (reader), along with OmniPod Dash Gen 3 (uses with Novolog) Gets dialysis on Tues, Thurs, Sat   Patient denies hypoglycemic s/sx including dizziness, shakiness, sweating. Patient denies hyperglycemic symptoms including polyuria, polydipsia, polyphagia, nocturia, neuropathy, blurred vision.  Current medication access support: BCBS Medicare  **A1c was 11.9% on 12/06/22 at [facility] - Note reports non-adherence to use of Novolog- settings adjusted during visit  Objective:  Lab Results  Component Value Date   HGBA1C 7.8 04/22/2022    Lab Results  Component Value Date   CREATININE 2.87 (H) 12/31/2022   BUN 19 12/31/2022   NA 134 (L) 12/31/2022   K 3.5 12/31/2022   CL 99 12/31/2022   CO2 27 12/31/2022    Lab Results  Component Value Date   CHOL 107 12/04/2021   HDL 36 (L) 12/04/2021   LDLCALC 50 12/04/2021   TRIG 112 12/04/2021   CHOLHDL 3.0 12/04/2021    Medications Reviewed Today   Medications were not reviewed in this encounter       Assessment/Plan:   Diabetes: - Currently uncontrolled - Reviewed long term cardiovascular and renal outcomes of uncontrolled blood sugar - Reviewed goal A1c, goal fasting, and goal 2 hour post prandial glucose - Recommend to check glucose several times a day with Josephine Igo 2 reader  Has been at the [facility] since hospital discharge- reports she is leaving tomorrow as she does NOT like this facility and likely wants to be home for Thanksgiving  **Follow Up Plan:**  - Follow-up call on 01/31/23 for  Libreview reader information - Was at dialysis during call today so did not have reader information- from the D/C note it reports that her pump was not being used at home since it broke down and they were waiting on the replacement to arrive - For future, consider switching Libreview from 2 to 3 for better scanning adherence (after  backorder problem resolves) - Significant adherence concerns regarding Novolog + Trulicity at this time- note says she averages about 40 units of insulin a day and with last supply, that would last her ~7 months  **Of note, son places medications in AM/PM boxes each week for her and husband is in-charge of picking up medications when due  Jocelyn Wells, PharmD [facility] Phone Number: [phone]

## 2023-01-05 ENCOUNTER — Telehealth: Payer: Self-pay | Admitting: Family Medicine

## 2023-01-05 ENCOUNTER — Telehealth: Payer: Self-pay

## 2023-01-05 ENCOUNTER — Other Ambulatory Visit: Payer: Self-pay | Admitting: Family Medicine

## 2023-01-05 DIAGNOSIS — Z992 Dependence on renal dialysis: Secondary | ICD-10-CM | POA: Diagnosis not present

## 2023-01-05 DIAGNOSIS — R296 Repeated falls: Secondary | ICD-10-CM | POA: Diagnosis not present

## 2023-01-05 MED ORDER — OXYCODONE HCL 5 MG PO TABS: 5.0000 mg | ORAL_TABLET | Freq: Four times a day (QID) | ORAL | 0 refills | Status: DC | PRN

## 2023-01-05 NOTE — Telephone Encounter (Signed)
Please advise 

## 2023-01-05 NOTE — Telephone Encounter (Signed)
Office received fax from [employer] [facility] for a cardiac clearance for this patient.  Fax forwarded to [employer], per [employer], [place].

## 2023-01-05 NOTE — Telephone Encounter (Signed)
Notified pt medication sent to the pharmacy and scheduled appt.

## 2023-01-05 NOTE — Telephone Encounter (Signed)
Pt's husband is calling in because pt had a head injury and was given oxyCODONE (OXY IR/ROXICODONE) 5 MG immediate release tablet [[id number]] while in [facility], but won't give her any upon her exit. Viviann Spare wants to know if Robynn Pane could prescribe them for her because pt has pain due to her injury. Viviann Spare is requesting someone give him a call as soon as possible.

## 2023-01-06 DIAGNOSIS — N250 Renal osteodystrophy: Secondary | ICD-10-CM | POA: Diagnosis not present

## 2023-01-06 DIAGNOSIS — D631 Anemia in chronic kidney disease: Secondary | ICD-10-CM | POA: Diagnosis not present

## 2023-01-06 DIAGNOSIS — N186 End stage renal disease: Secondary | ICD-10-CM | POA: Diagnosis not present

## 2023-01-06 DIAGNOSIS — D509 Iron deficiency anemia, unspecified: Secondary | ICD-10-CM | POA: Diagnosis not present

## 2023-01-06 DIAGNOSIS — Z992 Dependence on renal dialysis: Secondary | ICD-10-CM | POA: Diagnosis not present

## 2023-01-07 DIAGNOSIS — E1165 Type 2 diabetes mellitus with hyperglycemia: Secondary | ICD-10-CM | POA: Diagnosis not present

## 2023-01-08 DIAGNOSIS — D509 Iron deficiency anemia, unspecified: Secondary | ICD-10-CM | POA: Diagnosis not present

## 2023-01-08 DIAGNOSIS — N186 End stage renal disease: Secondary | ICD-10-CM | POA: Diagnosis not present

## 2023-01-08 DIAGNOSIS — D631 Anemia in chronic kidney disease: Secondary | ICD-10-CM | POA: Diagnosis not present

## 2023-01-08 DIAGNOSIS — Z992 Dependence on renal dialysis: Secondary | ICD-10-CM | POA: Diagnosis not present

## 2023-01-08 DIAGNOSIS — N250 Renal osteodystrophy: Secondary | ICD-10-CM | POA: Diagnosis not present

## 2023-01-10 DIAGNOSIS — M952 Other acquired deformity of head: Secondary | ICD-10-CM | POA: Diagnosis not present

## 2023-01-10 DIAGNOSIS — S0181XD Laceration without foreign body of other part of head, subsequent encounter: Secondary | ICD-10-CM | POA: Diagnosis not present

## 2023-01-11 ENCOUNTER — Telehealth: Payer: Self-pay

## 2023-01-11 DIAGNOSIS — Z992 Dependence on renal dialysis: Secondary | ICD-10-CM | POA: Diagnosis not present

## 2023-01-11 DIAGNOSIS — D631 Anemia in chronic kidney disease: Secondary | ICD-10-CM | POA: Diagnosis not present

## 2023-01-11 DIAGNOSIS — N250 Renal osteodystrophy: Secondary | ICD-10-CM | POA: Diagnosis not present

## 2023-01-11 DIAGNOSIS — N186 End stage renal disease: Secondary | ICD-10-CM | POA: Diagnosis not present

## 2023-01-11 DIAGNOSIS — D509 Iron deficiency anemia, unspecified: Secondary | ICD-10-CM | POA: Diagnosis not present

## 2023-01-11 NOTE — Transitions of Care (Post Inpatient/ED Visit) (Signed)
01/11/2023  Name: Jocelyn Wells MRN: [id number] DOB: 20-Dec-1955  Today's TOC FU Call Status: Today's TOC FU Call Status:: Successful TOC FU Call Completed TOC FU Call Complete Date: 01/11/23 Patient's Name and Date of Birth confirmed.  Transition Care Management Follow-up Telephone Call Date of Discharge: 01/10/23 Discharge Facility: Other [facility]) Name of Other (Non[facility]) Discharge [facility] Type of Discharge: Inpatient Admission Primary Inpatient Discharge Diagnosis:: fall How have you been since you were released from the hospital?: Better Any questions or concerns?: No  Items Reviewed: Did you receive and understand the discharge instructions provided?: Yes Medications obtained,verified, and reconciled?: Yes (Medications Reviewed) Any new allergies since your discharge?: No Dietary orders reviewed?: Yes Do you have support at home?: Yes People in Home: child(ren), adult, spouse  Medications Reviewed Today: Medications Reviewed Today     Reviewed by Karena Addison, LPN (Licensed Practical Nurse) on 01/11/23 at 1120  Med List Status: <None>   Medication Order Taking? Sig Documenting Provider Last Dose Status Informant  albuterol (VENTOLIN HFA) 108 (90 Base) MCG/ACT inhaler [id number] No TAKE 2 PUFFS BY MOUTH EVERY 6 HOURS AS NEEDED FOR WHEEZE OR SHORTNESS OF BREATH  Patient taking differently: Inhale 2 puffs into the lungs every 6 (six) hours as needed for wheezing or shortness of breath.   Jacky Kindle, FNP unknown Active Self, Pharmacy Records  apixaban (ELIQUIS) 5 MG TABS tablet [id number] No Take 1 tablet (5 mg total) by mouth 2 (two) times daily. Charlsie Quest, NP 12/26/2022 19:00 Active Self, Pharmacy Records  atorvastatin (LIPITOR) 40 MG tablet [id number] No Take 1 tablet (40 mg total) by mouth daily. Jacky Kindle, FNP 12/26/2022 Active Self, Pharmacy Records  buPROPion [facility] SR) 150 MG 12 hr tablet [id number] No TAKE 1 TABLET BY  MOUTH TWICE A DAY Jacky Kindle, FNP 12/26/2022 Active Self, Pharmacy Records  calcitRIOL (ROCALTROL) 0.25 MCG capsule [id number] No Take 0.25 mcg by mouth daily. [provider] 12/26/2022 Active Self, Pharmacy Records  Dulaglutide (TRULICITY) 4.5 MG/0.5ML SOPN [id number] No Inject 4.5 mg into the skin every 7 (seven) days.  Patient taking differently: Inject 4.5 mg into the skin every 7 (seven) days. Mondays    Past Week Active Self, Pharmacy Records           Med Note [place], AMY L   Mon Dec 27, 2022 10:08 AM) Last injection was 12/21/2022 per Patient.  fluticasone (FLONASE) 50 MCG/ACT nasal spray [id number] No Place 2 sprays into both nostrils daily.  Patient taking differently: Place 2 sprays into both nostrils as needed.   Jacky Kindle, FNP unknown Active Self, Pharmacy Records  gabapentin (NEURONTIN) 300 MG capsule [id number] No Take 300 mg by mouth daily. [provider] 12/26/2022 Active Self, Pharmacy Records  glucose blood test strip [id number] No  [provider] Taking Active Self, Pharmacy Records  hydroxyurea (HYDREA) 500 MG capsule [id number]  TAKE 1 CAPSULE BY MOUTH EVERY OTHER DAY. MAY TAKE WITH FOOD TO MINIMIZE GI SIDE EFFECTS. Creig Hines, MD  Active   insulin aspart (NOVOLOG) 100 UNIT/ML injection [id number] No Insulin pump [provider] 12/26/2022 Active Self, Pharmacy Records  Insulin Disposable Pump (OMNIPOD DASH PODS, GEN 4,) MISC [id number] No USE 1 POD EACH EVERY 72    HOURS [provider] Taking Active Self, Pharmacy Records  Insulin Human (INSULIN PUMP) SOLN [id number] No Per endocrinoloy Elgergawy, Leana Roe, MD Taking Active Self, Pharmacy Records  loratadine (CLARITIN) 10 MG tablet [id number] No Take 1  tablet by mouth daily. [provider] 12/26/2022 Active Self, Pharmacy Records           Med Note [facility], RACHEL A   Fri Mar 21, 2015 10:46 AM)    meclizine (ANTIVERT) 12.5 MG tablet [id number] No Take 1 tablet (12.5 mg  total) by mouth 3 (three) times daily as needed for dizziness. Caro Laroche, DO unknown Active Self, Pharmacy Records  oxyCODONE (OXY IR/ROXICODONE) 5 MG immediate release tablet [id number]  Take 1 tablet (5 mg total) by mouth every 6 (six) hours as needed for moderate pain (pain score 4-6), severe pain (pain score 7-10) or breakthrough pain. Jacky Kindle, FNP  Active   OXYGEN [id number] No Place 2 L into the nose continuous. [provider] 12/26/2022 Active Self, Pharmacy Records           Med Note Alfonse Ras, Vertell Novak   Wed May 05, 2022  2:12 PM) Oxygen at night 2L [place]  PARoxetine (PAXIL) 40 MG tablet [id number] No Take 1 tablet (40 mg total) by mouth daily. Neysa Hotter, MD 12/26/2022 Active Self, Pharmacy Records  Spacer/Aero-Holding Deretha [facility] DIAMOND) MISC [id number] No Use with inhaler to ensure medication is delivered throughout lungs Jacky Kindle, FNP Taking Active Self, Pharmacy Records  Tiotropium Bromide-Olodaterol (STIOLTO RESPIMAT) 2.5-2.5 MCG/ACT AERS [id number] No Inhale 2 puffs into the lungs daily. Salena Saner, MD unknown Active Self, Pharmacy Records  torsemide [facility]) 20 MG tablet [id number] No TAKE 3 TABLETS BY MOUTH EVERY DAY  Patient taking differently: Take 40 mg by mouth daily. TAKE 2 TABLETS BY MOUTH EVERY DAY   Delma Freeze, [place] 12/26/2022 Active Self, Pharmacy Records  Vitamin D, Ergocalciferol, (DRISDOL) 1.25 MG (50000 UNIT) CAPS capsule [id number] No Take 50,000 Units by mouth every 7 (seven) days. Sunday [provider] Past Month Active Self, Pharmacy Records           Med Note [place], AMY L   Mon Dec 27, 2022 10:12 AM) Patient has been out medication for about a month or so.   Med List Note Newman Pies, RN 10/25/22 1423): MR 01/25 Uds 10/25/2407/26/23            Home Care and Equipment/Supplies: Were Home Health Services Ordered?: Yes Name of Home Health Agency:: [facility] Has Agency set up a time to come to your  home?: Yes First Home Health Visit Date: 01/12/23  Functional Questionnaire: Do you need assistance with bathing/showering or dressing?: No Do you need assistance with meal preparation?: Yes Do you need assistance with eating?: No Do you have difficulty maintaining continence: No Do you need assistance with getting out of bed/getting out of a chair/moving?: No Do you have difficulty managing or taking your medications?: Yes  Follow up appointments reviewed: PCP Follow-up appointment confirmed?: Yes Date of PCP follow-up appointment?: 01/17/23 Follow-up Provider: [facility] Follow-up appointment confirmed?: NA Do you need transportation to your follow-up appointment?: No Do you understand care options if your condition(s) worsen?: Yes-patient verbalized understanding    SIGNATURE Karena Addison, LPN [facility] Nurse Health Advisor Direct Dial [phone]

## 2023-01-13 DIAGNOSIS — F32A Depression, unspecified: Secondary | ICD-10-CM | POA: Diagnosis not present

## 2023-01-13 DIAGNOSIS — E1140 Type 2 diabetes mellitus with diabetic neuropathy, unspecified: Secondary | ICD-10-CM | POA: Diagnosis not present

## 2023-01-13 DIAGNOSIS — M8008XD Age-related osteoporosis with current pathological fracture, vertebra(e), subsequent encounter for fracture with routine healing: Secondary | ICD-10-CM | POA: Diagnosis not present

## 2023-01-13 DIAGNOSIS — E1151 Type 2 diabetes mellitus with diabetic peripheral angiopathy without gangrene: Secondary | ICD-10-CM | POA: Diagnosis not present

## 2023-01-13 DIAGNOSIS — N186 End stage renal disease: Secondary | ICD-10-CM | POA: Diagnosis not present

## 2023-01-13 DIAGNOSIS — I132 Hypertensive heart and chronic kidney disease with heart failure and with stage 5 chronic kidney disease, or end stage renal disease: Secondary | ICD-10-CM | POA: Diagnosis not present

## 2023-01-13 DIAGNOSIS — Z8673 Personal history of transient ischemic attack (TIA), and cerebral infarction without residual deficits: Secondary | ICD-10-CM | POA: Diagnosis not present

## 2023-01-13 DIAGNOSIS — D631 Anemia in chronic kidney disease: Secondary | ICD-10-CM | POA: Diagnosis not present

## 2023-01-13 DIAGNOSIS — M109 Gout, unspecified: Secondary | ICD-10-CM | POA: Diagnosis not present

## 2023-01-13 DIAGNOSIS — J449 Chronic obstructive pulmonary disease, unspecified: Secondary | ICD-10-CM | POA: Diagnosis not present

## 2023-01-13 DIAGNOSIS — S0101XD Laceration without foreign body of scalp, subsequent encounter: Secondary | ICD-10-CM | POA: Diagnosis not present

## 2023-01-13 DIAGNOSIS — E113319 Type 2 diabetes mellitus with moderate nonproliferative diabetic retinopathy with macular edema, unspecified eye: Secondary | ICD-10-CM | POA: Diagnosis not present

## 2023-01-13 DIAGNOSIS — F419 Anxiety disorder, unspecified: Secondary | ICD-10-CM | POA: Diagnosis not present

## 2023-01-13 DIAGNOSIS — Z992 Dependence on renal dialysis: Secondary | ICD-10-CM | POA: Diagnosis not present

## 2023-01-13 DIAGNOSIS — E1122 Type 2 diabetes mellitus with diabetic chronic kidney disease: Secondary | ICD-10-CM | POA: Diagnosis not present

## 2023-01-13 DIAGNOSIS — Z79899 Other long term (current) drug therapy: Secondary | ICD-10-CM | POA: Diagnosis not present

## 2023-01-13 DIAGNOSIS — Z6832 Body mass index (BMI) 32.0-32.9, adult: Secondary | ICD-10-CM | POA: Diagnosis not present

## 2023-01-13 DIAGNOSIS — I5022 Chronic systolic (congestive) heart failure: Secondary | ICD-10-CM | POA: Diagnosis not present

## 2023-01-13 DIAGNOSIS — Z794 Long term (current) use of insulin: Secondary | ICD-10-CM | POA: Diagnosis not present

## 2023-01-13 DIAGNOSIS — I700 Atherosclerosis of aorta: Secondary | ICD-10-CM | POA: Diagnosis not present

## 2023-01-13 DIAGNOSIS — E1165 Type 2 diabetes mellitus with hyperglycemia: Secondary | ICD-10-CM | POA: Diagnosis not present

## 2023-01-13 DIAGNOSIS — D75839 Thrombocytosis, unspecified: Secondary | ICD-10-CM | POA: Diagnosis not present

## 2023-01-13 DIAGNOSIS — I5033 Acute on chronic diastolic (congestive) heart failure: Secondary | ICD-10-CM | POA: Diagnosis not present

## 2023-01-13 DIAGNOSIS — Z7901 Long term (current) use of anticoagulants: Secondary | ICD-10-CM | POA: Diagnosis not present

## 2023-01-17 ENCOUNTER — Telehealth: Payer: Self-pay

## 2023-01-17 ENCOUNTER — Telehealth (INDEPENDENT_AMBULATORY_CARE_PROVIDER_SITE_OTHER): Payer: Self-pay

## 2023-01-17 ENCOUNTER — Other Ambulatory Visit: Payer: Self-pay

## 2023-01-17 ENCOUNTER — Emergency Department
Admission: EM | Admit: 2023-01-17 | Discharge: 2023-01-17 | Disposition: A | Discharge status: Home / Self Care | Payer: Medicare Other | Attending: Emergency Medicine | Admitting: Emergency Medicine

## 2023-01-17 ENCOUNTER — Emergency Department: Payer: Medicare Other

## 2023-01-17 ENCOUNTER — Ambulatory Visit: Payer: Federal, State, Local not specified - PPO | Admitting: Family Medicine

## 2023-01-17 DIAGNOSIS — E119 Type 2 diabetes mellitus without complications: Secondary | ICD-10-CM | POA: Diagnosis not present

## 2023-01-17 DIAGNOSIS — J449 Chronic obstructive pulmonary disease, unspecified: Secondary | ICD-10-CM | POA: Insufficient documentation

## 2023-01-17 DIAGNOSIS — Z992 Dependence on renal dialysis: Secondary | ICD-10-CM | POA: Diagnosis not present

## 2023-01-17 DIAGNOSIS — N186 End stage renal disease: Secondary | ICD-10-CM | POA: Diagnosis not present

## 2023-01-17 DIAGNOSIS — I509 Heart failure, unspecified: Secondary | ICD-10-CM | POA: Insufficient documentation

## 2023-01-17 DIAGNOSIS — R0602 Shortness of breath: Secondary | ICD-10-CM | POA: Diagnosis not present

## 2023-01-17 DIAGNOSIS — T82868A Thrombosis of vascular prosthetic devices, implants and grafts, initial encounter: Secondary | ICD-10-CM | POA: Diagnosis not present

## 2023-01-17 DIAGNOSIS — I10 Essential (primary) hypertension: Secondary | ICD-10-CM | POA: Diagnosis not present

## 2023-01-17 DIAGNOSIS — T82898A Other specified complication of vascular prosthetic devices, implants and grafts, initial encounter: Secondary | ICD-10-CM | POA: Diagnosis not present

## 2023-01-17 LAB — BASIC METABOLIC PANEL
Anion gap: 14 (ref 5–15)
BUN: 54 mg/dL — ABNORMAL HIGH (ref 8–23)
CO2: 21 mmol/L — ABNORMAL LOW (ref 22–32)
Calcium: 9.4 mg/dL (ref 8.9–10.3)
Chloride: 102 mmol/L (ref 98–111)
Creatinine, Ser: 4.39 mg/dL — ABNORMAL HIGH (ref 0.44–1.00)
GFR, Estimated: 10 mL/min — ABNORMAL LOW (ref >60)
Glucose, Bld: 164 mg/dL — ABNORMAL HIGH (ref 70–99)
Potassium: 4.3 mmol/L (ref 3.5–5.1)
Sodium: 137 mmol/L (ref 135–145)

## 2023-01-17 LAB — CBC WITH DIFFERENTIAL/PLATELET
Abs Immature Granulocytes: 0.03 10*3/uL (ref 0.00–0.07)
Basophils Absolute: 0.1 10*3/uL (ref 0.0–0.1)
Basophils Relative: 1 %
Eosinophils Absolute: 0.5 10*3/uL (ref 0.0–0.5)
Eosinophils Relative: 7 %
HCT: 32.4 % — ABNORMAL LOW (ref 36.0–46.0)
Hemoglobin: 9.8 g/dL — ABNORMAL LOW (ref 12.0–15.0)
Immature Granulocytes: 0 %
Lymphocytes Relative: 17 %
Lymphs Abs: 1.4 10*3/uL (ref 0.7–4.0)
MCH: 28.6 pg (ref 26.0–34.0)
MCHC: 30.2 g/dL (ref 30.0–36.0)
MCV: 94.5 fL (ref 80.0–100.0)
Monocytes Absolute: 0.5 10*3/uL (ref 0.1–1.0)
Monocytes Relative: 6 %
Neutro Abs: 5.7 10*3/uL (ref 1.7–7.7)
Neutrophils Relative %: 69 %
Platelets: 536 10*3/uL — ABNORMAL HIGH (ref 150–400)
RBC: 3.43 MIL/uL — ABNORMAL LOW (ref 3.87–5.11)
RDW: 19.6 % — ABNORMAL HIGH (ref 11.5–15.5)
WBC: 8.3 10*3/uL (ref 4.0–10.5)
nRBC: 0 % (ref 0.0–0.2)

## 2023-01-17 LAB — TROPONIN I (HIGH SENSITIVITY): Troponin I (High Sensitivity): 14 ng/L (ref <18)

## 2023-01-17 NOTE — ED Provider Notes (Signed)
[facility] Provider Note    Event Date/Time   First MD Initiated Contact with Patient 01/17/23 1304     (approximate)   History   Vascular Access Problem   HPI  Jocelyn Wells is a 67 y.o. female with a history of end-stage renal disease, CHF, COPD, diabetes who presents with AV fistula issue.  She reports that she has not been able to have dialysis for 1 week typically she is Tuesday Thursday Saturday because her dialysis access is possibly clotted.  She has no shortness of breath or other complaints     Physical Exam   Triage Vital Signs: ED Triage Vitals  Encounter Vitals Group     BP 01/17/23 1043 139/71     Systolic BP Percentile --      Diastolic BP Percentile --      Pulse Rate 01/17/23 1043 64     Resp 01/17/23 1043 19     Temp 01/17/23 1043 98 F (36.7 C)     Temp src --      SpO2 01/17/23 1043 95 %     Weight 01/17/23 1044 87.1 kg (192 lb 0.3 oz)     Height 01/17/23 1044 1.676 m (5\' 6" )     Head Circumference --      Peak Flow --      Pain Score 01/17/23 1043 3     Pain Loc --      Pain Education --      Exclude from Growth Chart --     Most recent vital signs: Vitals:   01/17/23 1043  BP: 139/71  Pulse: 64  Resp: 19  Temp: 98 F (36.7 C)  SpO2: 95%     General: Awake, no distress.  CV:  Good peripheral perfusion.  Resp:  Normal effort.  Abd:  No distention.  Other:  Left arm AV fistula, no thrill palpated   ED Results / Procedures / Treatments   Labs (all labs ordered are listed, but only abnormal results are displayed) Labs Reviewed  BASIC METABOLIC PANEL - Abnormal; Notable for the following components:      Result Value   CO2 21 (*)    Glucose, Bld 164 (*)    BUN 54 (*)    Creatinine, Ser 4.39 (*)    GFR, Estimated 10 (*)    All other components within normal limits  CBC WITH DIFFERENTIAL/PLATELET - Abnormal; Notable for the following components:   RBC 3.43 (*)    Hemoglobin 9.8 (*)    HCT 32.4 (*)     RDW 19.6 (*)    Platelets 536 (*)    All other components within normal limits  TROPONIN I (HIGH SENSITIVITY)     EKG  ED ECG REPORT I, Jene Every, the attending physician, personally viewed and interpreted this ECG.  Date: 01/17/2023  Rhythm: normal sinus rhythm QRS Axis: normal Intervals: Abnormal ST/T Wave abnormalities: Changes Narrative Interpretation: no evidence of acute ischemia    RADIOLOGY Chest x-ray viewed interpret by me, no acute abnormality    PROCEDURES:  Critical Care performed:   Procedures   MEDICATIONS ORDERED IN ED: Medications - No data to display   IMPRESSION / MDM / ASSESSMENT AND PLAN / ED COURSE  I reviewed the triage vital signs and the nursing notes. Patient's presentation is most consistent with exacerbation of chronic illness.  Patient presents with AV fistula malfunction, on exam it appears to be clotted, no thrill palpable.  Lab  work is reassuring.  Discussed with Dr. Wyn Quaker of vascular surgery, he will arrange for declotting procedure with his staff, no indication for admission at this time,, patient agrees with this plan        FINAL CLINICAL IMPRESSION(S) / ED DIAGNOSES   Final diagnoses:  AV fistula occlusion, initial encounter [facility]     Rx / DC Orders   ED Discharge Orders     None        Note:  This document was prepared using Dragon voice recognition software and may include unintentional dictation errors.   Jene Every, MD 01/17/23 [phone]

## 2023-01-17 NOTE — ED Notes (Signed)
Full rainbow sent to lab.  

## 2023-01-17 NOTE — ED Provider Triage Note (Signed)
Emergency Medicine Provider Triage Evaluation Note  Jocelyn Wells , a 67 y.o. female  was evaluated in triage.  Pt complains of unable to complete dialysis, center believes that there are clots in the fistula. Patient has not had dialysis in 6 days.   Review of Systems  Positive: SOB, mild chest pain Negative: Pain at fistula,   Physical Exam  There were no vitals taken for this visit. Gen:   Awake, no distress   Resp:  Normal effort  MSK:   Moves extremities without difficulty  Other:    Medical Decision Making  Medically screening exam initiated at 10:42 AM.  Appropriate orders placed.  Jocelyn Wells was informed that the remainder of the evaluation will be completed by another provider, this initial triage assessment does not replace that evaluation, and the importance of remaining in the ED until their evaluation is complete.     Cameron Ali, PA-C 01/17/23 1044

## 2023-01-17 NOTE — ED Triage Notes (Signed)
Pt comes with c/o dialysis port issue. Pt states tomorrow it will be week since she had treatment. Pt states her doc thinks she had clots in her left arm. Pt states no pain.   Pt states little cp and sob.

## 2023-01-17 NOTE — Telephone Encounter (Signed)
Copied from CRM [id number]. Topic: Appointments - Appointment Info/Confirmation >> Jan 17, 2023  9:56 AM Albin Felling L wrote: Reason for CRM: Pt cx appointment for today, pt sates she is being sent to hospital. There is an issue with her fistula and she has not had dialysis in a while.   Pt requested Robynn Pane be updated.

## 2023-01-17 NOTE — Telephone Encounter (Signed)
Spoke with the patient and she is scheduled with Dr. Wyn Quaker on 01/19/23 with a 6:45 am arrival time to the [facility], for a left arm declot and permcath insertion. Pre-procedure instructions were discussed and patient states she understood. Patient stated she does not use her Mychart so I am unable to send the instructions for her.

## 2023-01-17 NOTE — Discharge Instructions (Signed)
The vascular surgeon office will contact you to schedule the declotting procedure

## 2023-01-18 ENCOUNTER — Telehealth: Payer: Self-pay

## 2023-01-18 NOTE — Transitions of Care (Post Inpatient/ED Visit) (Signed)
   01/18/2023  Name: STEPHINIE JOLLIE MRN: [id number] DOB: May 22, 1955  Today's TOC FU Call Status: Today's TOC FU Call Status:: Unsuccessful Call (1st Attempt) Unsuccessful Call (1st Attempt) Date: 01/18/23  Attempted to reach the patient regarding the most recent Inpatient/ED visit.  Follow Up Plan: Additional outreach attempts will be made to reach the patient to complete the Transitions of Care (Post Inpatient/ED visit) call.    Ziyad Dyar, CMA  [facility] Team Direct Dial: [phone]

## 2023-01-19 ENCOUNTER — Encounter
Admission: RE | Disposition: A | Discharge status: Home / Self Care | Payer: Self-pay | Source: Ambulatory Visit | Attending: Vascular Surgery

## 2023-01-19 ENCOUNTER — Encounter: Payer: Self-pay | Admitting: Vascular Surgery

## 2023-01-19 ENCOUNTER — Ambulatory Visit
Admission: RE | Admit: 2023-01-19 | Discharge: 2023-01-19 | Disposition: A | Discharge status: Home / Self Care | Payer: Medicare Other | Source: Ambulatory Visit | Attending: Vascular Surgery | Admitting: Vascular Surgery

## 2023-01-19 ENCOUNTER — Other Ambulatory Visit: Payer: Self-pay

## 2023-01-19 DIAGNOSIS — Z8249 Family history of ischemic heart disease and other diseases of the circulatory system: Secondary | ICD-10-CM | POA: Insufficient documentation

## 2023-01-19 DIAGNOSIS — N186 End stage renal disease: Secondary | ICD-10-CM | POA: Diagnosis not present

## 2023-01-19 DIAGNOSIS — E1151 Type 2 diabetes mellitus with diabetic peripheral angiopathy without gangrene: Secondary | ICD-10-CM | POA: Insufficient documentation

## 2023-01-19 DIAGNOSIS — I2510 Atherosclerotic heart disease of native coronary artery without angina pectoris: Secondary | ICD-10-CM | POA: Insufficient documentation

## 2023-01-19 DIAGNOSIS — Z87891 Personal history of nicotine dependence: Secondary | ICD-10-CM | POA: Insufficient documentation

## 2023-01-19 DIAGNOSIS — T82868A Thrombosis of vascular prosthetic devices, implants and grafts, initial encounter: Secondary | ICD-10-CM | POA: Diagnosis not present

## 2023-01-19 DIAGNOSIS — T82858A Stenosis of vascular prosthetic devices, implants and grafts, initial encounter: Secondary | ICD-10-CM | POA: Diagnosis not present

## 2023-01-19 DIAGNOSIS — Y832 Surgical operation with anastomosis, bypass or graft as the cause of abnormal reaction of the patient, or of later complication, without mention of misadventure at the time of the procedure: Secondary | ICD-10-CM | POA: Insufficient documentation

## 2023-01-19 DIAGNOSIS — Z833 Family history of diabetes mellitus: Secondary | ICD-10-CM | POA: Diagnosis not present

## 2023-01-19 DIAGNOSIS — Z992 Dependence on renal dialysis: Secondary | ICD-10-CM

## 2023-01-19 DIAGNOSIS — I5022 Chronic systolic (congestive) heart failure: Secondary | ICD-10-CM | POA: Diagnosis not present

## 2023-01-19 DIAGNOSIS — I132 Hypertensive heart and chronic kidney disease with heart failure and with stage 5 chronic kidney disease, or end stage renal disease: Secondary | ICD-10-CM | POA: Insufficient documentation

## 2023-01-19 DIAGNOSIS — E1122 Type 2 diabetes mellitus with diabetic chronic kidney disease: Secondary | ICD-10-CM | POA: Diagnosis not present

## 2023-01-19 HISTORY — PX: DIALYSIS/PERMA CATHETER INSERTION: CATH118288

## 2023-01-19 HISTORY — PX: PERIPHERAL VASCULAR THROMBECTOMY: CATH118306

## 2023-01-19 LAB — POTASSIUM (ARMC VASCULAR LAB ONLY): Potassium (ARMC vascular lab): 4.7 mmol/L (ref 3.5–5.1)

## 2023-01-19 LAB — GLUCOSE, CAPILLARY
Glucose-Capillary: 132 mg/dL — ABNORMAL HIGH (ref 70–99)
Glucose-Capillary: 129 mg/dL — ABNORMAL HIGH (ref 70–99)

## 2023-01-19 SURGERY — PERIPHERAL VASCULAR THROMBECTOMY
Anesthesia: Moderate Sedation

## 2023-01-19 MED ORDER — HYDROMORPHONE HCL 1 MG/ML IJ SOLN: 1.0000 mg | Freq: Once | INTRAMUSCULAR | Status: DC | PRN

## 2023-01-19 MED ORDER — FAMOTIDINE 20 MG PO TABS: 40.0000 mg | ORAL_TABLET | Freq: Once | ORAL | Status: DC | PRN

## 2023-01-19 MED ORDER — MIDAZOLAM HCL 5 MG/5ML IJ SOLN
INTRAMUSCULAR | Status: AC
Start: 2023-01-19 — End: ?
  Filled 2023-01-19: qty 5

## 2023-01-19 MED ORDER — SODIUM CHLORIDE 0.9 % IV SOLN
INTRAVENOUS | Status: DC
Start: 2023-01-19 — End: 2023-01-19

## 2023-01-19 MED ORDER — FENTANYL CITRATE (PF) 100 MCG/2ML IJ SOLN
INTRAMUSCULAR | Status: DC | PRN
  Administered 2023-01-19: 50 ug via INTRAVENOUS
  Administered 2023-01-19: 25 ug via INTRAVENOUS

## 2023-01-19 MED ORDER — ONDANSETRON HCL 4 MG/2ML IJ SOLN: 4.0000 mg | Freq: Four times a day (QID) | INTRAMUSCULAR | Status: DC | PRN

## 2023-01-19 MED ORDER — METHYLPREDNISOLONE SODIUM SUCC 125 MG IJ SOLR: 125.0000 mg | Freq: Once | INTRAMUSCULAR | Status: DC | PRN

## 2023-01-19 MED ORDER — CEFAZOLIN SODIUM-DEXTROSE 1-4 GM/50ML-% IV SOLN
INTRAVENOUS | Status: AC
Start: 2023-01-19 — End: ?
  Filled 2023-01-19: qty 50

## 2023-01-19 MED ORDER — ALTEPLASE 1 MG/ML SYRINGE FOR VASCULAR PROCEDURE
INTRAMUSCULAR | Status: DC | PRN
  Administered 2023-01-19: 4 mg via INTRA_ARTERIAL

## 2023-01-19 MED ORDER — HEPARIN SODIUM (PORCINE) 1000 UNIT/ML IJ SOLN
INTRAMUSCULAR | Status: DC | PRN
  Administered 2023-01-19: 3000 [IU] via INTRAVENOUS

## 2023-01-19 MED ORDER — HEPARIN (PORCINE) IN NACL 1000-0.9 UT/500ML-% IV SOLN
INTRAVENOUS | Status: DC | PRN
  Administered 2023-01-19: 500 mL

## 2023-01-19 MED ORDER — CEFAZOLIN SODIUM-DEXTROSE 1-4 GM/50ML-% IV SOLN
1.0000 g | INTRAVENOUS | Status: AC
  Administered 2023-01-19: 1 g via INTRAVENOUS

## 2023-01-19 MED ORDER — MIDAZOLAM HCL 2 MG/ML PO SYRP: 8.0000 mg | ORAL_SOLUTION | Freq: Once | ORAL | Status: DC | PRN

## 2023-01-19 MED ORDER — DIPHENHYDRAMINE HCL 50 MG/ML IJ SOLN: 50.0000 mg | Freq: Once | INTRAMUSCULAR | Status: DC | PRN

## 2023-01-19 MED ORDER — LIDOCAINE-EPINEPHRINE (PF) 1 %-1:200000 IJ SOLN
INTRAMUSCULAR | Status: DC | PRN
  Administered 2023-01-19 (×2): 5 mL via INTRADERMAL

## 2023-01-19 MED ORDER — ALTEPLASE 2 MG IJ SOLR
INTRAMUSCULAR | Status: AC
  Filled 2023-01-19: qty 4

## 2023-01-19 MED ORDER — FENTANYL CITRATE (PF) 100 MCG/2ML IJ SOLN
INTRAMUSCULAR | Status: AC
  Filled 2023-01-19: qty 2

## 2023-01-19 MED ORDER — HEPARIN SODIUM (PORCINE) 1000 UNIT/ML IJ SOLN
INTRAMUSCULAR | Status: AC
  Filled 2023-01-19: qty 10

## 2023-01-19 MED ORDER — IODIXANOL 320 MG/ML IV SOLN
INTRAVENOUS | Status: DC | PRN
  Administered 2023-01-19: 35 mL via INTRAVENOUS

## 2023-01-19 MED ORDER — MIDAZOLAM HCL 2 MG/2ML IJ SOLN
INTRAMUSCULAR | Status: DC | PRN
  Administered 2023-01-19 (×3): 1 mg via INTRAVENOUS

## 2023-01-19 SURGICAL SUPPLY — 12 items
BALLN LUTONIX 7X80X130 (BALLOONS) ×2
BALLOON LUTONIX 7X80X130 (BALLOONS) IMPLANT
CANNULA 5F STIFF (CANNULA) IMPLANT
CATH THROMBEC 7F 65 CLEANER15 (CATHETERS) IMPLANT
COVER PROBE ULTRASOUND 5X96 (MISCELLANEOUS) IMPLANT
DEVICE PRESTO INFLATION (MISCELLANEOUS) IMPLANT
DRAPE BRACHIAL (DRAPES) IMPLANT
PACK ANGIOGRAPHY (CUSTOM PROCEDURE TRAY) ×2 IMPLANT
SHEATH BRITE TIP 7FRX5.5 (SHEATH) IMPLANT
STENT VIABAHN 8X7.5X120 (Permanent Stent) IMPLANT
SUT MNCRL AB 4-0 PS2 18 (SUTURE) IMPLANT
WIRE G 018X200 V18 (WIRE) IMPLANT

## 2023-01-19 NOTE — Telephone Encounter (Signed)
Second attempt was made requesting status of clearance.

## 2023-01-19 NOTE — H&P (Signed)
[facility] Admission History & Physical  MRN : [id number]  Jocelyn Wells is a 67 y.o. (1955-09-22) female who presents with chief complaint of No chief complaint on file. Marland Kitchen  History of Present Illness: I am asked to evaluate the patient by the dialysis center. The patient was sent here because they were unable to cannulate the access this week. Furthermore the Center states there is no thrill or bruit. The patient states this is the first dialysis run to be missed. This problem is acute in onset and has been present for approximately 2 days. The patient is unaware of any other change.   Patient denies pain or tenderness overlying the access.  There is no pain with dialysis.  The patient denies hand pain or finger pain consistent with steal syndrome.    There have been past interventions or declots of this access.  The patient is not chronically hypotensive on dialysis.  Current Facility-Administered Medications  Medication Dose Route Frequency Provider Last Rate Last Admin   0.9 %  sodium chloride infusion   Intravenous Continuous Georgiana Spinner, NP 10 mL/hr at 01/19/23 0750 New Bag at 01/19/23 0750   ceFAZolin (ANCEF) IVPB 1 g/50 mL premix  1 g Intravenous 30 min Pre-Op Georgiana Spinner, NP 100 mL/hr at 01/19/23 0751 1 g at 01/19/23 0751   diphenhydrAMINE (BENADRYL) injection 50 mg  50 mg Intravenous Once PRN Georgiana Spinner, NP       famotidine (PEPCID) tablet 40 mg  40 mg Oral Once PRN Georgiana Spinner, NP       HYDROmorphone (DILAUDID) injection 1 mg  1 mg Intravenous Once PRN Georgiana Spinner, NP       methylPREDNISolone sodium succinate (SOLU-MEDROL) 125 mg/2 mL injection 125 mg  125 mg Intravenous Once PRN Georgiana Spinner, NP       midazolam (VERSED) 2 MG/ML syrup 8 mg  8 mg Oral Once PRN Georgiana Spinner, NP       ondansetron [facility]) injection 4 mg  4 mg Intravenous Q6H PRN Georgiana Spinner, NP        Past Medical History:  Diagnosis Date   Acute ischemic  left MCA stroke (HCC) 01/29/2015   a.) MRI brain 01/29/2015: acute left MCA territory infarcts involving the posterior left frontal lobe and left insular cortex   Anemia of chronic renal failure    Anxiety    Arthritis    Complication of anesthesia    a.) PONV; b.) awareness under anesthesia   COPD (chronic obstructive pulmonary disease) (HCC)    Depression    Dyspnea    ESRD (end stage renal disease) (HCC)    Headache    HFrEF (heart failure with reduced ejection fraction) (HCC)    a.) TTE 01/30/2015: EF 30-35%, diff HK, LAE, G2DD; b.) TTE 06/13/2015: EF 40-45%, diff HK, G1DD; c.) TTE 10/06/2017: EF 60-65% MAC, no IAS; d.) TTE 09/29/2021: EF 45-50%;, LVH, LAE, AoV sclerosis, G1DD; e.) TTE 02/24/2022: EF 60-65%, no IAS   HTN (hypertension)    Leukocytosis    Long term current use of anticoagulant    a.) apixaban   Long term current use of antithrombotics/antiplatelets    a.) clopidogrel   NICM (nonischemic cardiomyopathy) (HCC)    a.) TTE 01/30/2015: EF 30-35%; b.) R/LHC 03/25/2015: EF 25-30%, mRA 13, mPA 30, mPCWP 26, LVEDP 17, CO 3.66, CI 1.8; c.) TTE 06/13/2015: EF 40-45%; d.) TTE 10/06/2017: EF 60-65%; e.) TTE 09/29/2021: EF  45-50%; f.) TTE 02/24/2022: EF 60-65%   Obesity    On supplemental oxygen by nasal cannula    a.) 2L/[place] at night   Osteoporosis    Peripheral vascular disease (HCC)    Type 2 diabetes mellitus treated with insulin [facility])     Past Surgical History:  Procedure Laterality Date   ABDOMINAL HYSTERECTOMY     ANKLE FRACTURE SURGERY Left 02/09/2000   AV FISTULA PLACEMENT Left 06/18/2022   Procedure: ARTERIOVENOUS (AV) FISTULA CREATION (BRACHIAL AXILLA);  Surgeon: Renford Dills, MD;  Location: [facility] ORS;  Service: Vascular;  Laterality: Left;   BILATERAL SALPINGOOPHORECTOMY  02/08/1998   CARDIAC CATHETERIZATION N/A 03/25/2015   Procedure: Right/Left Heart Cath and Coronary Angiography;  Surgeon: Lyn Records, MD;  Location: [facility] INVASIVE CV LAB;  Service:  Cardiovascular;  Laterality: N/A;   CESAREAN SECTION  02/09/1976   COLONOSCOPY WITH PROPOFOL N/A 09/22/2016   Procedure: COLONOSCOPY WITH PROPOFOL;  Surgeon: Scot Jun, MD;  Location: [facility] ENDOSCOPY;  Service: Endoscopy;  Laterality: N/A;   COLONOSCOPY WITH PROPOFOL N/A 02/15/2022   Procedure: COLONOSCOPY WITH PROPOFOL;  Surgeon: Toney Reil, MD;  Location: [facility] ENDOSCOPY;  Service: Gastroenterology;  Laterality: N/A;   EP IMPLANTABLE DEVICE N/A 08/05/2015   Procedure: Loop Recorder Insertion;  Surgeon: Duke Salvia, MD;  Location: [facility] INVASIVE CV LAB;  Service: Cardiovascular;  Laterality: N/A;   ESOPHAGOGASTRODUODENOSCOPY N/A 02/15/2022   Procedure: ESOPHAGOGASTRODUODENOSCOPY (EGD);  Surgeon: Toney Reil, MD;  Location: [facility] ENDOSCOPY;  Service: Gastroenterology;  Laterality: N/A;   ESOPHAGOGASTRODUODENOSCOPY (EGD) WITH PROPOFOL N/A 09/22/2016   Procedure: ESOPHAGOGASTRODUODENOSCOPY (EGD) WITH PROPOFOL;  Surgeon: Scot Jun, MD;  Location: [facility] ENDOSCOPY;  Service: Endoscopy;  Laterality: N/A;   ESOPHAGOGASTRODUODENOSCOPY (EGD) WITH PROPOFOL N/A 12/08/2016   Procedure: ESOPHAGOGASTRODUODENOSCOPY (EGD) WITH PROPOFOL;  Surgeon: Scot Jun, MD;  Location: [facility] ENDOSCOPY;  Service: Endoscopy;  Laterality: N/A;   ESOPHAGOGASTRODUODENOSCOPY (EGD) WITH PROPOFOL N/A 10/12/2022   Procedure: ESOPHAGOGASTRODUODENOSCOPY (EGD) WITH PROPOFOL;  Surgeon: Toney Reil, MD;  Location: [facility] ENDOSCOPY;  Service: Gastroenterology;  Laterality: N/A;   FACIAL LACERATION REPAIR N/A 12/27/2022   Procedure: FACIAL LACERATION REPAIR;  Surgeon: Scarlette Ar, MD;  Location: [facility] OR;  Service: ENT;  Laterality: N/A;   FRACTURE SURGERY     GIVENS CAPSULE STUDY N/A 04/19/2022   Procedure: GIVENS CAPSULE STUDY;  Surgeon: Toney Reil, MD;  Location: [facility] ENDOSCOPY;  Service: Gastroenterology;  Laterality: N/A;   GIVENS CAPSULE STUDY N/A 10/12/2022   Procedure: GIVENS CAPSULE  STUDY;  Surgeon: Toney Reil, MD;  Location: [facility] ENDOSCOPY;  Service: Gastroenterology;  Laterality: N/A;   INSERTION HYBRID ANTERIOVENOUS GORTEX GRAFT  06/18/2022   Procedure: INSERTION HYBRID ANTERIOVENOUS GORTEX GRAFT;  Surgeon: Renford Dills, MD;  Location: [facility] ORS;  Service: Vascular;;   KNEE ARTHROSCOPY Left 02/09/2003   LOWER EXTREMITY ANGIOGRAPHY Left 08/12/2021   Procedure: Lower Extremity Angiography;  Surgeon: Renford Dills, MD;  Location: [facility] INVASIVE CV LAB;  Service: Cardiovascular;  Laterality: Left;   TEE WITHOUT CARDIOVERSION N/A 01/31/2015   Procedure: TRANSESOPHAGEAL ECHOCARDIOGRAM (TEE);  Surgeon: Lewayne Bunting, MD;  Location: [facility] ENDOSCOPY;  Service: Cardiovascular;  Laterality: N/A;   TEMPORARY DIALYSIS CATHETER N/A 08/14/2021   Procedure: TEMPORARY DIALYSIS CATHETER;  Surgeon: Renford Dills, MD;  Location: [facility] INVASIVE CV LAB;  Service: Cardiovascular;  Laterality: N/A;   TUBAL LIGATION  02/09/1976   ULNAR NERVE TRANSPOSITION  02/08/2006     Social History   Tobacco Use  Smoking status: Former    Current packs/day: 0.00    Average packs/day: 0.5 packs/day for 48.3 years (24.2 ttl pk-yrs)    Types: Cigarettes    Start date: 02/08/1974    Quit date: 05/31/2022    Years since quitting: 0.6   Smokeless tobacco: Never  Vaping Use   Vaping status: Never Used  Substance Use Topics   Alcohol use: No    Alcohol/week: 0.0 standard drinks of alcohol   Drug use: No    Family History  Problem Relation Age of Onset   Heart disease Mother        died from CHF   Asthma Mother    Diabetes Mother    Heart disease Father    Aneurysm Father    COPD Brother    Diabetes Brother    Alcohol abuse Paternal Aunt    Cancer Maternal Grandmother        unknownorigin   Anemia Neg Hx    Arrhythmia Neg Hx    Clotting disorder Neg Hx    Fainting Neg Hx    Heart attack Neg Hx    Heart failure Neg Hx    Hyperlipidemia Neg Hx    Hypertension Neg Hx      No family history of bleeding or clotting disorders, autoimmune disease or porphyria  Allergies  Allergen Reactions   Codeine Nausea And Vomiting   Ivp Dye [Iodinated Contrast Media]     Patients states the IV Dye shuts her kidneys down     REVIEW OF SYSTEMS (Negative unless checked)  Constitutional: [] Weight loss  [] Fever  [] Chills Cardiac: [] Chest pain   [] Chest pressure   [] Palpitations   [] Shortness of breath when laying flat   [] Shortness of breath at rest   [x] Shortness of breath with exertion. Vascular:  [] Pain in legs with walking   [] Pain in legs at rest   [] Pain in legs when laying flat   [] Claudication   [] Pain in feet when walking  [] Pain in feet at rest  [] Pain in feet when laying flat   [] History of DVT   [] Phlebitis   [] Swelling in legs   [] Varicose veins   [] Non-healing ulcers Pulmonary:   [] Uses home oxygen   [] Productive cough   [] Hemoptysis   [] Wheeze  [x] COPD   [] Asthma Neurologic:  [] Dizziness  [] Blackouts   [] Seizures   [x] History of stroke   [] History of TIA  [] Aphasia   [] Temporary blindness   [] Dysphagia   [] Weakness or numbness in arms   [] Weakness or numbness in legs Musculoskeletal:  [x] Arthritis   [] Joint swelling   [] Joint pain   [] Low back pain Hematologic:  [] Easy bruising  [] Easy bleeding   [] Hypercoagulable state   [x] Anemic  [] Hepatitis Gastrointestinal:  [] Blood in stool   [] Vomiting blood  [] Gastroesophageal reflux/heartburn   [] Difficulty swallowing. Genitourinary:  [x] Chronic kidney disease   [] Difficult urination  [] Frequent urination  [] Burning with urination   [] Blood in urine Skin:  [] Rashes   [] Ulcers   [] Wounds Psychological:  [x] History of anxiety   []  History of major depression.  Physical Examination  Vitals:   01/19/23 0730  BP: (!) 155/77  Resp: 18  Temp: 97.7 F (36.5 C)  TempSrc: Oral  SpO2: 93%  Weight: 91.4 kg  Height: 5\' 6"  (1.676 m)   Body mass index is 32.52 kg/m. Gen: WD/WN, NAD Head: [place]/AT, No temporalis  wasting. Ear/Nose/Throat: Hearing grossly intact, nares w/o erythema or drainage, oropharynx w/o Erythema/Exudate,  Eyes: Conjunctiva clear, sclera non-icteric Neck:  Trachea midline.  No JVD.  Pulmonary:  Good air movement, respirations not labored, no use of accessory muscles.  Cardiac: RRR, normal S1, S2. Vascular: no thrill in access Vessel Right Left  Radial Palpable Palpable   Musculoskeletal: M/S 5/5 throughout.  Extremities without ischemic changes.  No deformity or atrophy.  Neurologic: Sensation grossly intact in extremities.  Symmetrical.  Speech is fluent. Motor exam as listed above. Psychiatric: Judgment intact, Mood & affect appropriate for pt's clinical situation. Dermatologic: No rashes or ulcers noted.  No cellulitis or open wounds.    CBC Lab Results  Component Value Date   WBC 8.3 01/17/2023   HGB 9.8 (L) 01/17/2023   HCT 32.4 (L) 01/17/2023   MCV 94.5 01/17/2023   PLT 536 (H) 01/17/2023    BMET    Component Value Date/Time   NA 137 01/17/2023 1044   NA 140 12/04/2021 1542   K 4.3 01/17/2023 1044   CL 102 01/17/2023 1044   CO2 21 (L) 01/17/2023 1044   GLUCOSE 164 (H) 01/17/2023 1044   BUN 54 (H) 01/17/2023 1044   BUN 38 (H) 12/04/2021 1542   CREATININE 4.39 (H) 01/17/2023 1044   CREATININE 1.37 (H) 03/21/2015 1451   CALCIUM 9.4 01/17/2023 1044   GFRNONAA 10 (L) 01/17/2023 1044   GFRNONAA 13 04/22/2022 1233   GFRAA 33 (L) 04/23/2019 1446   Estimated Creatinine Clearance: 14 mL/min (A) (by C-G formula based on SCr of 4.39 mg/dL (H)).  COAG Lab Results  Component Value Date   INR 1.6 (H) 12/27/2022   INR 1.5 (H) 08/14/2021   INR 0.93 10/05/2017    Radiology DG Chest 2 View  Result Date: 01/17/2023 CLINICAL DATA:  Shortness of breath. EXAM: CHEST - 2 VIEW COMPARISON:  01/01/2022. FINDINGS: There are new linear opacities overlying the left lower lung zone, favored to represent atelectasis and/or scarring. Bilateral lung fields are otherwise  clear. No acute consolidation or lung collapse. No pulmonary edema. Bilateral costophrenic angles are clear. Normal cardio-mediastinal silhouette. Presumed loop recorder device noted overlying the left lower anterior chest wall. No acute osseous abnormalities. The soft tissues are within normal limits. IMPRESSION: *No active cardiopulmonary disease. Electronically Signed   By: Jules Schick M.D.   On: 01/17/2023 11:35   CT HEAD WO CONTRAST  Result Date: 12/27/2022 CLINICAL DATA:  Fall when walking dog with forehead laceration. EXAM: CT HEAD WITHOUT CONTRAST CT MAXILLOFACIAL WITHOUT CONTRAST CT CERVICAL SPINE WITHOUT CONTRAST TECHNIQUE: Multidetector CT imaging of the head, cervical spine, and maxillofacial structures were performed using the standard protocol without intravenous contrast. Multiplanar CT image reconstructions of the cervical spine and maxillofacial structures were also generated. RADIATION DOSE REDUCTION: This exam was performed according to the departmental dose-optimization program which includes automated exposure control, adjustment of the mA and/or kV according to patient size and/or use of iterative reconstruction technique. COMPARISON:  09/19/2017 head CT FINDINGS: CT HEAD FINDINGS Brain: No evidence of acute infarction, hemorrhage, hydrocephalus, extra-axial collection or mass lesion/mass effect. Brain atrophy most pronounced in the biparietal region. Vascular: No hyperdense vessel or unexpected calcification. Skull: Deep laceration to the left forehead with undermining and flap of scalp folded laterally. Gas nearly reaches the bone. No underlying fracture or opaque foreign body. CT MAXILLOFACIAL FINDINGS Osseous: No acute fracture or mandibular dislocation. Orbits: No evidence of injury to the postseptal structures Sinuses: Left maxillary sinusitis with debris like appearance and partial mineralization. There is associated sclerotic wall thickening and bulging of the medial wall. Soft  tissues:  Forehead injury as above. CT CERVICAL SPINE FINDINGS Alignment: Normal. Skull base and vertebrae: No acute fracture. No primary bone lesion or focal pathologic process. Soft tissues and spinal canal: No prevertebral fluid or swelling. No visible canal hematoma. Disc levels:  Cervical spine degeneration which is mild for age. Upper chest: No evidence of injury IMPRESSION: 1. Extensive left anterior scalp laceration without underlying fracture. 2. No evidence of intracranial or cervical spine injury. 3. Chronic left maxillary sinusitis. Electronically Signed   By: Tiburcio Pea M.D.   On: 12/27/2022 04:46   CT MAXILLOFACIAL WO CONTRAST  Result Date: 12/27/2022 CLINICAL DATA:  Fall when walking dog with forehead laceration. EXAM: CT HEAD WITHOUT CONTRAST CT MAXILLOFACIAL WITHOUT CONTRAST CT CERVICAL SPINE WITHOUT CONTRAST TECHNIQUE: Multidetector CT imaging of the head, cervical spine, and maxillofacial structures were performed using the standard protocol without intravenous contrast. Multiplanar CT image reconstructions of the cervical spine and maxillofacial structures were also generated. RADIATION DOSE REDUCTION: This exam was performed according to the departmental dose-optimization program which includes automated exposure control, adjustment of the mA and/or kV according to patient size and/or use of iterative reconstruction technique. COMPARISON:  09/19/2017 head CT FINDINGS: CT HEAD FINDINGS Brain: No evidence of acute infarction, hemorrhage, hydrocephalus, extra-axial collection or mass lesion/mass effect. Brain atrophy most pronounced in the biparietal region. Vascular: No hyperdense vessel or unexpected calcification. Skull: Deep laceration to the left forehead with undermining and flap of scalp folded laterally. Gas nearly reaches the bone. No underlying fracture or opaque foreign body. CT MAXILLOFACIAL FINDINGS Osseous: No acute fracture or mandibular dislocation. Orbits: No evidence of  injury to the postseptal structures Sinuses: Left maxillary sinusitis with debris like appearance and partial mineralization. There is associated sclerotic wall thickening and bulging of the medial wall. Soft tissues: Forehead injury as above. CT CERVICAL SPINE FINDINGS Alignment: Normal. Skull base and vertebrae: No acute fracture. No primary bone lesion or focal pathologic process. Soft tissues and spinal canal: No prevertebral fluid or swelling. No visible canal hematoma. Disc levels:  Cervical spine degeneration which is mild for age. Upper chest: No evidence of injury IMPRESSION: 1. Extensive left anterior scalp laceration without underlying fracture. 2. No evidence of intracranial or cervical spine injury. 3. Chronic left maxillary sinusitis. Electronically Signed   By: Tiburcio Pea M.D.   On: 12/27/2022 04:46   CT CERVICAL SPINE WO CONTRAST  Result Date: 12/27/2022 CLINICAL DATA:  Fall when walking dog with forehead laceration. EXAM: CT HEAD WITHOUT CONTRAST CT MAXILLOFACIAL WITHOUT CONTRAST CT CERVICAL SPINE WITHOUT CONTRAST TECHNIQUE: Multidetector CT imaging of the head, cervical spine, and maxillofacial structures were performed using the standard protocol without intravenous contrast. Multiplanar CT image reconstructions of the cervical spine and maxillofacial structures were also generated. RADIATION DOSE REDUCTION: This exam was performed according to the departmental dose-optimization program which includes automated exposure control, adjustment of the mA and/or kV according to patient size and/or use of iterative reconstruction technique. COMPARISON:  09/19/2017 head CT FINDINGS: CT HEAD FINDINGS Brain: No evidence of acute infarction, hemorrhage, hydrocephalus, extra-axial collection or mass lesion/mass effect. Brain atrophy most pronounced in the biparietal region. Vascular: No hyperdense vessel or unexpected calcification. Skull: Deep laceration to the left forehead with undermining and  flap of scalp folded laterally. Gas nearly reaches the bone. No underlying fracture or opaque foreign body. CT MAXILLOFACIAL FINDINGS Osseous: No acute fracture or mandibular dislocation. Orbits: No evidence of injury to the postseptal structures Sinuses: Left maxillary sinusitis with debris like appearance and partial  mineralization. There is associated sclerotic wall thickening and bulging of the medial wall. Soft tissues: Forehead injury as above. CT CERVICAL SPINE FINDINGS Alignment: Normal. Skull base and vertebrae: No acute fracture. No primary bone lesion or focal pathologic process. Soft tissues and spinal canal: No prevertebral fluid or swelling. No visible canal hematoma. Disc levels:  Cervical spine degeneration which is mild for age. Upper chest: No evidence of injury IMPRESSION: 1. Extensive left anterior scalp laceration without underlying fracture. 2. No evidence of intracranial or cervical spine injury. 3. Chronic left maxillary sinusitis. Electronically Signed   By: Tiburcio Pea M.D.   On: 12/27/2022 04:46    Assessment/Plan 1.  Complication dialysis device with thrombosis AV access:  Patient's dialysis access is thrombosed. The patient will undergo thrombectomy using interventional techniques.  The risks and benefits were described to the patient.  All questions were answered.  The patient agrees to proceed with angiography and intervention. Potassium will be drawn to ensure that it is an appropriate level prior to performing thrombectomy. 2.  End-stage renal disease requiring hemodialysis:  Patient will continue dialysis therapy without further interruption if a successful thrombectomy is not achieved then catheter will be placed. Dialysis has already been arranged since the patient missed their previous session 3.  Hypertension:  Patient will continue medical management; nephrology is following no changes in oral medications. 4. Diabetes mellitus:  Glucose will be monitored and oral  medications been held this morning once the patient has undergone the patient's procedure po intake will be reinitiated and again Accu-Cheks will be used to assess the blood glucose level and treat as needed. The patient will be restarted on the patient's usual hypoglycemic regime 5.  Coronary artery disease:  EKG will be monitored. Nitrates will be used if needed. The patient's oral cardiac medications will be continued.    Festus Barren, MD  01/19/2023 8:06 AM

## 2023-01-19 NOTE — Transitions of Care (Post Inpatient/ED Visit) (Signed)
   01/19/2023  Name: Jocelyn Wells MRN: [id number] DOB: October 11, 1955  Today's TOC FU Call Status: Today's TOC FU Call Status:: Unsuccessful Call (2nd Attempt) Unsuccessful Call (1st Attempt) Date: 01/18/23 Unsuccessful Call (2nd Attempt) Date: 01/19/23  Attempted to reach the patient regarding the most recent Inpatient/ED visit.  Follow Up Plan: Additional outreach attempts will be made to reach the patient to complete the Transitions of Care (Post Inpatient/ED visit) call.    Kenner Lewan, CMA  [facility] Team Direct Dial: [phone]

## 2023-01-19 NOTE — Op Note (Signed)
[facility]    OPERATIVE NOTE   PROCEDURE: 1.  Left brachial artery to axillary vein arteriovenous graft cannulation under ultrasound guidance in both a retrograde and then antegrade fashion crossing 2.  Left arm shuntogram and central venogram 3.  Catheter directed thrombolysis with 4 mg of TPA  4.  Mechanical thrombectomy to the left brachial artery to axillary vein AV graft with the 7 [place] cleaner device 5.  Stent placement to the left brachial artery to axillary vein AV graft for residual stenosis and thrombus after thrombectomy with 8 mm diameter by 7.5 cm length Viabahn stent  PRE-OPERATIVE DIAGNOSIS: 1. ESRD 2.  Thrombosed left brachial artery to axillary vein arteriovenous graft  POST-OPERATIVE DIAGNOSIS: same as above   SURGEON: Festus Barren, MD  ANESTHESIA: local with Moderate Conscious Sedation for approximately 33 minutes using 3 mg of Versed and 75 mcg of Fentanyl  ESTIMATED BLOOD LOSS: 10 cc  FINDING(S): Thrombosed graft with stenosis in the mid to distal portion of the graft  SPECIMEN(S):  None  CONTRAST: 35 cc  FLUORO TIME: 4.1 minutes  INDICATIONS: Patient is a 67 y.o.female who presents with a thrombosed left brachial artery to axillary vein arteriovenous graft.  The patient is scheduled for an attempted declot and shuntogram.  The patient is aware the risks include but are not limited to: bleeding, infection, thrombosis of the cannulated access, and possible anaphylactic reaction to the contrast.  The patient is aware of the risks of the procedure and elects to proceed forward.  DESCRIPTION: After full informed written consent was obtained, the patient was brought back to the angiography suite and placed supine upon the angiography table.  The patient was connected to monitoring equipment. Moderate conscious sedation was administered with a face to face encounter with the patient throughout the procedure with my supervision of the RN  administering medicines and monitoring the patient's vital signs, pulse oximetry, telemetry and mental status throughout from the start of the procedure until the patient was taken to the recovery room. The left arm was prepped and draped in the standard fashion for a percutaneous access intervention.  Under ultrasound guidance, the left brachial artery to axillary vein arteriovenous graft was cannulated with a micropuncture needle under direct ultrasound guidance due to the pulseless nature of the graft in both an antegrade and a retrograde fashion crossing, and permanent images were performed.  The microwire was advanced and the needle was exchanged for the a microsheath.  I then upsized to a 7 Fr Sheath and imaging was performed.  Hand injections were completed to image the access including the central venous system. This demonstrated no flow within the AV graft.  Based on the images, this patient will need extensive treatment to salvage the graft. I then gave the patient 3000 units of intravenous heparin. 4 mg of TPA were deployed. This was allowed to dwell. Mechanical thrombectomy using the cleaner device was then performed throughout the graft and into the axillary vein. This uncovered a stenosis of about 60% in the mid to distal graft.  The thrombectomy resulted in resolution of the arterial plug, and clearance of the arterial side of the graft. The arterial outflow was seen to be intact distally. The retrograde sheath was removed. I then turned my attention to the thrombus in the distal graft and the axillary vein. Mechanical thrombectomy was performed. This resulted in near resolution of the thrombus but there remained the stenosis in the mid to distal portion of the  graft.  I then elected to treat this with a Viabahn stent.  An 8 mm diameter by 7.5 cm length Viabahn stent was then deployed and postdilated with a 7 mm balloon with excellent angiographic completion result and less than 10% residual stenosis  and brisk flow through the graft.    Based on the completion imaging, no further intervention is necessary.  The wire and balloon were removed from the sheath.  A 4-0 Monocryl purse-string suture was sewn around the sheath.  The sheath was removed while tying down the suture.  A sterile bandage was applied to the puncture site.  COMPLICATIONS: None  CONDITION: Stable   Festus Barren 01/19/2023 9:11 AM   This note was created with Dragon Medical transcription system. Any errors in dictation are purely unintentional.

## 2023-01-20 ENCOUNTER — Encounter: Payer: Self-pay | Admitting: Vascular Surgery

## 2023-01-20 DIAGNOSIS — D631 Anemia in chronic kidney disease: Secondary | ICD-10-CM | POA: Diagnosis not present

## 2023-01-20 DIAGNOSIS — Z992 Dependence on renal dialysis: Secondary | ICD-10-CM | POA: Diagnosis not present

## 2023-01-20 DIAGNOSIS — N250 Renal osteodystrophy: Secondary | ICD-10-CM | POA: Diagnosis not present

## 2023-01-20 DIAGNOSIS — N186 End stage renal disease: Secondary | ICD-10-CM | POA: Diagnosis not present

## 2023-01-20 DIAGNOSIS — D509 Iron deficiency anemia, unspecified: Secondary | ICD-10-CM | POA: Diagnosis not present

## 2023-01-21 ENCOUNTER — Telehealth: Payer: Self-pay | Admitting: Family Medicine

## 2023-01-21 DIAGNOSIS — E1122 Type 2 diabetes mellitus with diabetic chronic kidney disease: Secondary | ICD-10-CM | POA: Diagnosis not present

## 2023-01-21 DIAGNOSIS — N186 End stage renal disease: Secondary | ICD-10-CM | POA: Diagnosis not present

## 2023-01-21 DIAGNOSIS — I5022 Chronic systolic (congestive) heart failure: Secondary | ICD-10-CM | POA: Diagnosis not present

## 2023-01-21 DIAGNOSIS — I132 Hypertensive heart and chronic kidney disease with heart failure and with stage 5 chronic kidney disease, or end stage renal disease: Secondary | ICD-10-CM | POA: Diagnosis not present

## 2023-01-21 DIAGNOSIS — S0101XD Laceration without foreign body of scalp, subsequent encounter: Secondary | ICD-10-CM | POA: Diagnosis not present

## 2023-01-21 DIAGNOSIS — I5033 Acute on chronic diastolic (congestive) heart failure: Secondary | ICD-10-CM | POA: Diagnosis not present

## 2023-01-21 NOTE — Telephone Encounter (Signed)
Jerrye Beavers, PT is with [facility] is calling to report that the patient fell Wednesday with no injuries. CB- [phone]

## 2023-01-22 DIAGNOSIS — D631 Anemia in chronic kidney disease: Secondary | ICD-10-CM | POA: Diagnosis not present

## 2023-01-22 DIAGNOSIS — D509 Iron deficiency anemia, unspecified: Secondary | ICD-10-CM | POA: Diagnosis not present

## 2023-01-22 DIAGNOSIS — N186 End stage renal disease: Secondary | ICD-10-CM | POA: Diagnosis not present

## 2023-01-22 DIAGNOSIS — Z992 Dependence on renal dialysis: Secondary | ICD-10-CM | POA: Diagnosis not present

## 2023-01-22 DIAGNOSIS — N250 Renal osteodystrophy: Secondary | ICD-10-CM | POA: Diagnosis not present

## 2023-01-25 ENCOUNTER — Telehealth: Payer: Self-pay | Admitting: Family Medicine

## 2023-01-25 DIAGNOSIS — N186 End stage renal disease: Secondary | ICD-10-CM | POA: Diagnosis not present

## 2023-01-25 DIAGNOSIS — Z992 Dependence on renal dialysis: Secondary | ICD-10-CM | POA: Diagnosis not present

## 2023-01-25 DIAGNOSIS — N250 Renal osteodystrophy: Secondary | ICD-10-CM | POA: Diagnosis not present

## 2023-01-25 DIAGNOSIS — D509 Iron deficiency anemia, unspecified: Secondary | ICD-10-CM | POA: Diagnosis not present

## 2023-01-25 DIAGNOSIS — D631 Anemia in chronic kidney disease: Secondary | ICD-10-CM | POA: Diagnosis not present

## 2023-01-25 NOTE — Telephone Encounter (Unsigned)
Copied from CRM [id number]. Topic: Quick Communication - Home Health Verbal Orders >> Jan 25, 2023  1:54 PM Ja-Kwan M wrote: Caller/Agency: Verdene Lennert with [facility]  Callback Number: [phone] Service Requested: Physical Therapy Frequency: 1 x 9  Any new concerns about the patient? No

## 2023-01-26 DIAGNOSIS — N186 End stage renal disease: Secondary | ICD-10-CM | POA: Diagnosis not present

## 2023-01-26 DIAGNOSIS — I132 Hypertensive heart and chronic kidney disease with heart failure and with stage 5 chronic kidney disease, or end stage renal disease: Secondary | ICD-10-CM | POA: Diagnosis not present

## 2023-01-26 DIAGNOSIS — E1122 Type 2 diabetes mellitus with diabetic chronic kidney disease: Secondary | ICD-10-CM | POA: Diagnosis not present

## 2023-01-26 DIAGNOSIS — I5033 Acute on chronic diastolic (congestive) heart failure: Secondary | ICD-10-CM | POA: Diagnosis not present

## 2023-01-26 DIAGNOSIS — I5022 Chronic systolic (congestive) heart failure: Secondary | ICD-10-CM | POA: Diagnosis not present

## 2023-01-26 DIAGNOSIS — S0101XD Laceration without foreign body of scalp, subsequent encounter: Secondary | ICD-10-CM | POA: Diagnosis not present

## 2023-01-26 NOTE — Telephone Encounter (Signed)
LMTCB-Ok for [facility] Nurse to give providers ok for orders to [employer] if they call back.

## 2023-01-27 DIAGNOSIS — D509 Iron deficiency anemia, unspecified: Secondary | ICD-10-CM | POA: Diagnosis not present

## 2023-01-27 DIAGNOSIS — Z992 Dependence on renal dialysis: Secondary | ICD-10-CM | POA: Diagnosis not present

## 2023-01-27 DIAGNOSIS — D631 Anemia in chronic kidney disease: Secondary | ICD-10-CM | POA: Diagnosis not present

## 2023-01-27 DIAGNOSIS — N186 End stage renal disease: Secondary | ICD-10-CM | POA: Diagnosis not present

## 2023-01-27 DIAGNOSIS — N250 Renal osteodystrophy: Secondary | ICD-10-CM | POA: Diagnosis not present

## 2023-01-29 DIAGNOSIS — N186 End stage renal disease: Secondary | ICD-10-CM | POA: Diagnosis not present

## 2023-01-29 DIAGNOSIS — D631 Anemia in chronic kidney disease: Secondary | ICD-10-CM | POA: Diagnosis not present

## 2023-01-29 DIAGNOSIS — Z992 Dependence on renal dialysis: Secondary | ICD-10-CM | POA: Diagnosis not present

## 2023-01-29 DIAGNOSIS — N250 Renal osteodystrophy: Secondary | ICD-10-CM | POA: Diagnosis not present

## 2023-01-29 DIAGNOSIS — D509 Iron deficiency anemia, unspecified: Secondary | ICD-10-CM | POA: Diagnosis not present

## 2023-01-31 ENCOUNTER — Other Ambulatory Visit: Payer: Self-pay | Admitting: Pharmacist

## 2023-01-31 ENCOUNTER — Telehealth: Payer: Self-pay | Admitting: Pharmacist

## 2023-01-31 DIAGNOSIS — E1142 Type 2 diabetes mellitus with diabetic polyneuropathy: Secondary | ICD-10-CM | POA: Diagnosis not present

## 2023-01-31 DIAGNOSIS — E113393 Type 2 diabetes mellitus with moderate nonproliferative diabetic retinopathy without macular edema, bilateral: Secondary | ICD-10-CM | POA: Diagnosis not present

## 2023-01-31 DIAGNOSIS — D509 Iron deficiency anemia, unspecified: Secondary | ICD-10-CM | POA: Diagnosis not present

## 2023-01-31 DIAGNOSIS — Z9641 Presence of insulin pump (external) (internal): Secondary | ICD-10-CM | POA: Diagnosis not present

## 2023-01-31 DIAGNOSIS — E1159 Type 2 diabetes mellitus with other circulatory complications: Secondary | ICD-10-CM | POA: Diagnosis not present

## 2023-01-31 DIAGNOSIS — Z794 Long term (current) use of insulin: Secondary | ICD-10-CM | POA: Diagnosis not present

## 2023-01-31 DIAGNOSIS — E1121 Type 2 diabetes mellitus with diabetic nephropathy: Secondary | ICD-10-CM | POA: Diagnosis not present

## 2023-01-31 DIAGNOSIS — N250 Renal osteodystrophy: Secondary | ICD-10-CM | POA: Diagnosis not present

## 2023-01-31 DIAGNOSIS — E1129 Type 2 diabetes mellitus with other diabetic kidney complication: Secondary | ICD-10-CM | POA: Diagnosis not present

## 2023-01-31 DIAGNOSIS — D631 Anemia in chronic kidney disease: Secondary | ICD-10-CM | POA: Diagnosis not present

## 2023-01-31 DIAGNOSIS — E1122 Type 2 diabetes mellitus with diabetic chronic kidney disease: Secondary | ICD-10-CM | POA: Diagnosis not present

## 2023-01-31 DIAGNOSIS — N186 End stage renal disease: Secondary | ICD-10-CM | POA: Diagnosis not present

## 2023-01-31 DIAGNOSIS — I10 Essential (primary) hypertension: Secondary | ICD-10-CM | POA: Diagnosis not present

## 2023-01-31 DIAGNOSIS — M810 Age-related osteoporosis without current pathological fracture: Secondary | ICD-10-CM | POA: Diagnosis not present

## 2023-01-31 DIAGNOSIS — Z992 Dependence on renal dialysis: Secondary | ICD-10-CM | POA: Diagnosis not present

## 2023-01-31 DIAGNOSIS — E1165 Type 2 diabetes mellitus with hyperglycemia: Secondary | ICD-10-CM | POA: Diagnosis not present

## 2023-01-31 LAB — HEMOGLOBIN A1C: Hemoglobin A1C: 809

## 2023-01-31 NOTE — Progress Notes (Signed)
   01/31/2023  Patient ID: Jocelyn Wells, female   DOB: 09/12/1955, 67 y.o.   MRN: [id number]  Tried calling the patient for our follow-up call today. Unable to reach and left a HIPAA compliant voicemail requesting call back at earliest convenience. Will try again in 1 week.    For questions: Patient has NOT picked up Oxycodone still  Missing fills for: Calcitriol, Gabapentin, Trulicity, and Stiolto   Marlowe Aschoff, PharmD [facility] Phone Number: [phone]

## 2023-02-01 DIAGNOSIS — I132 Hypertensive heart and chronic kidney disease with heart failure and with stage 5 chronic kidney disease, or end stage renal disease: Secondary | ICD-10-CM | POA: Diagnosis not present

## 2023-02-01 DIAGNOSIS — S0101XD Laceration without foreign body of scalp, subsequent encounter: Secondary | ICD-10-CM | POA: Diagnosis not present

## 2023-02-01 DIAGNOSIS — I5033 Acute on chronic diastolic (congestive) heart failure: Secondary | ICD-10-CM | POA: Diagnosis not present

## 2023-02-01 DIAGNOSIS — I5022 Chronic systolic (congestive) heart failure: Secondary | ICD-10-CM | POA: Diagnosis not present

## 2023-02-01 DIAGNOSIS — E1122 Type 2 diabetes mellitus with diabetic chronic kidney disease: Secondary | ICD-10-CM | POA: Diagnosis not present

## 2023-02-01 DIAGNOSIS — N186 End stage renal disease: Secondary | ICD-10-CM | POA: Diagnosis not present

## 2023-02-03 DIAGNOSIS — D509 Iron deficiency anemia, unspecified: Secondary | ICD-10-CM | POA: Diagnosis not present

## 2023-02-03 DIAGNOSIS — Z992 Dependence on renal dialysis: Secondary | ICD-10-CM | POA: Diagnosis not present

## 2023-02-03 DIAGNOSIS — N250 Renal osteodystrophy: Secondary | ICD-10-CM | POA: Diagnosis not present

## 2023-02-03 DIAGNOSIS — D631 Anemia in chronic kidney disease: Secondary | ICD-10-CM | POA: Diagnosis not present

## 2023-02-03 DIAGNOSIS — N186 End stage renal disease: Secondary | ICD-10-CM | POA: Diagnosis not present

## 2023-02-04 ENCOUNTER — Other Ambulatory Visit (INDEPENDENT_AMBULATORY_CARE_PROVIDER_SITE_OTHER): Payer: Self-pay | Admitting: Nurse Practitioner

## 2023-02-04 DIAGNOSIS — N186 End stage renal disease: Secondary | ICD-10-CM

## 2023-02-05 DIAGNOSIS — Z992 Dependence on renal dialysis: Secondary | ICD-10-CM | POA: Diagnosis not present

## 2023-02-05 DIAGNOSIS — D509 Iron deficiency anemia, unspecified: Secondary | ICD-10-CM | POA: Diagnosis not present

## 2023-02-05 DIAGNOSIS — N186 End stage renal disease: Secondary | ICD-10-CM | POA: Diagnosis not present

## 2023-02-05 DIAGNOSIS — N250 Renal osteodystrophy: Secondary | ICD-10-CM | POA: Diagnosis not present

## 2023-02-05 DIAGNOSIS — D631 Anemia in chronic kidney disease: Secondary | ICD-10-CM | POA: Diagnosis not present

## 2023-02-08 ENCOUNTER — Ambulatory Visit (INDEPENDENT_AMBULATORY_CARE_PROVIDER_SITE_OTHER): Payer: Medicare Other | Admitting: Nurse Practitioner

## 2023-02-08 ENCOUNTER — Encounter (INDEPENDENT_AMBULATORY_CARE_PROVIDER_SITE_OTHER): Payer: Medicare Other

## 2023-02-08 ENCOUNTER — Encounter (INDEPENDENT_AMBULATORY_CARE_PROVIDER_SITE_OTHER): Payer: Self-pay

## 2023-02-08 DIAGNOSIS — N250 Renal osteodystrophy: Secondary | ICD-10-CM | POA: Diagnosis not present

## 2023-02-08 DIAGNOSIS — Z992 Dependence on renal dialysis: Secondary | ICD-10-CM | POA: Diagnosis not present

## 2023-02-08 DIAGNOSIS — D509 Iron deficiency anemia, unspecified: Secondary | ICD-10-CM | POA: Diagnosis not present

## 2023-02-08 DIAGNOSIS — N186 End stage renal disease: Secondary | ICD-10-CM | POA: Diagnosis not present

## 2023-02-08 DIAGNOSIS — D631 Anemia in chronic kidney disease: Secondary | ICD-10-CM | POA: Diagnosis not present

## 2023-02-10 ENCOUNTER — Encounter: Payer: Self-pay | Admitting: Oncology

## 2023-02-10 DIAGNOSIS — Z992 Dependence on renal dialysis: Secondary | ICD-10-CM | POA: Diagnosis not present

## 2023-02-10 DIAGNOSIS — N250 Renal osteodystrophy: Secondary | ICD-10-CM | POA: Diagnosis not present

## 2023-02-10 DIAGNOSIS — D509 Iron deficiency anemia, unspecified: Secondary | ICD-10-CM | POA: Diagnosis not present

## 2023-02-10 DIAGNOSIS — D631 Anemia in chronic kidney disease: Secondary | ICD-10-CM | POA: Diagnosis not present

## 2023-02-10 DIAGNOSIS — N186 End stage renal disease: Secondary | ICD-10-CM | POA: Diagnosis not present

## 2023-02-11 ENCOUNTER — Telehealth: Payer: Self-pay | Admitting: Internal Medicine

## 2023-02-11 ENCOUNTER — Telehealth: Payer: Self-pay | Admitting: *Deleted

## 2023-02-11 DIAGNOSIS — H43823 Vitreomacular adhesion, bilateral: Secondary | ICD-10-CM | POA: Diagnosis not present

## 2023-02-11 DIAGNOSIS — H31013 Macula scars of posterior pole (postinflammatory) (post-traumatic), bilateral: Secondary | ICD-10-CM | POA: Diagnosis not present

## 2023-02-11 DIAGNOSIS — M810 Age-related osteoporosis without current pathological fracture: Secondary | ICD-10-CM | POA: Diagnosis not present

## 2023-02-11 DIAGNOSIS — E113513 Type 2 diabetes mellitus with proliferative diabetic retinopathy with macular edema, bilateral: Secondary | ICD-10-CM | POA: Diagnosis not present

## 2023-02-11 DIAGNOSIS — H43811 Vitreous degeneration, right eye: Secondary | ICD-10-CM | POA: Diagnosis not present

## 2023-02-11 DIAGNOSIS — H4313 Vitreous hemorrhage, bilateral: Secondary | ICD-10-CM | POA: Diagnosis not present

## 2023-02-11 DIAGNOSIS — H2513 Age-related nuclear cataract, bilateral: Secondary | ICD-10-CM | POA: Diagnosis not present

## 2023-02-11 NOTE — Telephone Encounter (Signed)
 Requesting office called to see if the pt has been cleared. I reviewed the chart but do not see that the pt has been cleared. I am going to forward this to the preop APP to review.

## 2023-02-11 NOTE — Telephone Encounter (Signed)
 Jocelyn Wells is requesting a callback regarding the clearance request that was faxed on 12/31/2022 and 01/19/2023. She stated pt is coming in their office today to get an surgical date but they hadn't heard anything back. Please advise

## 2023-02-11 NOTE — Telephone Encounter (Signed)
  Virtual visit please   [name], NP13 minutes ago (1:57 PM)    Is this regarding the vitrectomy clearance that was received 12/20/22? The NP that had most recently seen her routed the message to her PCP who was going to see her in the office given that they manage her Eliquis . For medical clearance, we need to schedule a virtual visit.   Jocelyn EMERSON Percy, NP-C  02/11/2023, 1:56 PM [address], [address] Office [phone] Fax [phone]        Note   You routed conversation to [facility] Preop33 minutes ago (1:37 PM)   You34 minutes ago (1:36 PM)    Requesting office called to see if the pt has been cleared. I reviewed the chart but do not see that the pt has been cleared. I am going to forward this to the preop APP to review.       Note   Jocelyn Wells routed conversation to [employer] Preop Callback49 minutes ago (1:22 PM)   Jocelyn, [name] minutes ago (1:22 PM)   [name] is requesting a callback regarding the clearance request that was faxed on 12/31/2022 and 01/19/2023. She stated pt is coming in their office today to get an surgical date but they hadn't heard anything back. Please advise       Note   [facility] [phone]  Jocelyn Wells minutes ago (1:19 PM)   option 0 fax [phone] and [phone]  Incoming call

## 2023-02-11 NOTE — Telephone Encounter (Signed)
 Is this regarding the vitrectomy clearance that was received 12/20/22? The NP that had most recently seen her routed the message to her PCP who was going to see her in the office given that they manage her Eliquis . For medical clearance, we need to schedule a virtual visit.  Jocelyn EMERSON Bane, NP-C  02/11/2023, 1:56 PM [address] Office [phone] Fax [phone]

## 2023-02-11 NOTE — Telephone Encounter (Signed)
 Left message to call back to schedule tele pre op appt.

## 2023-02-11 NOTE — Telephone Encounter (Signed)
 Called patient no answer and left a vm to call back to sche a tele visit for preop

## 2023-02-12 DIAGNOSIS — I5022 Chronic systolic (congestive) heart failure: Secondary | ICD-10-CM | POA: Diagnosis not present

## 2023-02-12 DIAGNOSIS — E1122 Type 2 diabetes mellitus with diabetic chronic kidney disease: Secondary | ICD-10-CM | POA: Diagnosis not present

## 2023-02-12 DIAGNOSIS — N250 Renal osteodystrophy: Secondary | ICD-10-CM | POA: Diagnosis not present

## 2023-02-12 DIAGNOSIS — D75839 Thrombocytosis, unspecified: Secondary | ICD-10-CM | POA: Diagnosis not present

## 2023-02-12 DIAGNOSIS — F419 Anxiety disorder, unspecified: Secondary | ICD-10-CM | POA: Diagnosis not present

## 2023-02-12 DIAGNOSIS — Z7901 Long term (current) use of anticoagulants: Secondary | ICD-10-CM | POA: Diagnosis not present

## 2023-02-12 DIAGNOSIS — Z79899 Other long term (current) drug therapy: Secondary | ICD-10-CM | POA: Diagnosis not present

## 2023-02-12 DIAGNOSIS — I5033 Acute on chronic diastolic (congestive) heart failure: Secondary | ICD-10-CM | POA: Diagnosis not present

## 2023-02-12 DIAGNOSIS — D509 Iron deficiency anemia, unspecified: Secondary | ICD-10-CM | POA: Diagnosis not present

## 2023-02-12 DIAGNOSIS — F32A Depression, unspecified: Secondary | ICD-10-CM | POA: Diagnosis not present

## 2023-02-12 DIAGNOSIS — E1151 Type 2 diabetes mellitus with diabetic peripheral angiopathy without gangrene: Secondary | ICD-10-CM | POA: Diagnosis not present

## 2023-02-12 DIAGNOSIS — Z794 Long term (current) use of insulin: Secondary | ICD-10-CM | POA: Diagnosis not present

## 2023-02-12 DIAGNOSIS — D631 Anemia in chronic kidney disease: Secondary | ICD-10-CM | POA: Diagnosis not present

## 2023-02-12 DIAGNOSIS — I700 Atherosclerosis of aorta: Secondary | ICD-10-CM | POA: Diagnosis not present

## 2023-02-12 DIAGNOSIS — E1165 Type 2 diabetes mellitus with hyperglycemia: Secondary | ICD-10-CM | POA: Diagnosis not present

## 2023-02-12 DIAGNOSIS — Z992 Dependence on renal dialysis: Secondary | ICD-10-CM | POA: Diagnosis not present

## 2023-02-12 DIAGNOSIS — M8008XD Age-related osteoporosis with current pathological fracture, vertebra(e), subsequent encounter for fracture with routine healing: Secondary | ICD-10-CM | POA: Diagnosis not present

## 2023-02-12 DIAGNOSIS — N186 End stage renal disease: Secondary | ICD-10-CM | POA: Diagnosis not present

## 2023-02-12 DIAGNOSIS — I132 Hypertensive heart and chronic kidney disease with heart failure and with stage 5 chronic kidney disease, or end stage renal disease: Secondary | ICD-10-CM | POA: Diagnosis not present

## 2023-02-12 DIAGNOSIS — Z8673 Personal history of transient ischemic attack (TIA), and cerebral infarction without residual deficits: Secondary | ICD-10-CM | POA: Diagnosis not present

## 2023-02-12 DIAGNOSIS — E113319 Type 2 diabetes mellitus with moderate nonproliferative diabetic retinopathy with macular edema, unspecified eye: Secondary | ICD-10-CM | POA: Diagnosis not present

## 2023-02-12 DIAGNOSIS — E1140 Type 2 diabetes mellitus with diabetic neuropathy, unspecified: Secondary | ICD-10-CM | POA: Diagnosis not present

## 2023-02-12 DIAGNOSIS — J449 Chronic obstructive pulmonary disease, unspecified: Secondary | ICD-10-CM | POA: Diagnosis not present

## 2023-02-12 DIAGNOSIS — Z6832 Body mass index (BMI) 32.0-32.9, adult: Secondary | ICD-10-CM | POA: Diagnosis not present

## 2023-02-12 DIAGNOSIS — M109 Gout, unspecified: Secondary | ICD-10-CM | POA: Diagnosis not present

## 2023-02-12 DIAGNOSIS — S0101XD Laceration without foreign body of scalp, subsequent encounter: Secondary | ICD-10-CM | POA: Diagnosis not present

## 2023-02-14 ENCOUNTER — Telehealth: Payer: Self-pay

## 2023-02-14 DIAGNOSIS — E1140 Type 2 diabetes mellitus with diabetic neuropathy, unspecified: Secondary | ICD-10-CM | POA: Diagnosis not present

## 2023-02-14 DIAGNOSIS — B351 Tinea unguium: Secondary | ICD-10-CM | POA: Diagnosis not present

## 2023-02-14 DIAGNOSIS — Z794 Long term (current) use of insulin: Secondary | ICD-10-CM | POA: Diagnosis not present

## 2023-02-14 NOTE — Telephone Encounter (Signed)
  Patient Consent for Virtual Visit         Jocelyn Wells has provided verbal consent on 02/14/2023 for a virtual visit (video or telephone).   CONSENT FOR VIRTUAL VISIT FOR:  Jocelyn Wells  By participating in this virtual visit I agree to the following:  I hereby voluntarily request, consent and authorize [facility] and its employed or contracted physicians, physician assistants, nurse practitioners or other licensed health care professionals (the Practitioner), to provide me with telemedicine health care services (the "Services) as deemed necessary by the treating Practitioner. I acknowledge and consent to receive the Services by the Practitioner via telemedicine. I understand that the telemedicine visit will involve communicating with the Practitioner through live audiovisual communication technology and the disclosure of certain medical information by electronic transmission. I acknowledge that I have been given the opportunity to request an in-person assessment or other available alternative prior to the telemedicine visit and am voluntarily participating in the telemedicine visit.  I understand that I have the right to withhold or withdraw my consent to the use of telemedicine in the course of my care at any time, without affecting my right to future care or treatment, and that the Practitioner or I may terminate the telemedicine visit at any time. I understand that I have the right to inspect all information obtained and/or recorded in the course of the telemedicine visit and may receive copies of available information for a reasonable fee.  I understand that some of the potential risks of receiving the Services via telemedicine include:  Delay or interruption in medical evaluation due to technological equipment failure or disruption; Information transmitted may not be sufficient (e.g. poor resolution of images) to allow for appropriate medical decision making by the Practitioner;  and/or  In rare instances, security protocols could fail, causing a breach of personal health information.  Furthermore, I acknowledge that it is my responsibility to provide information about my medical history, conditions and care that is complete and accurate to the best of my ability. I acknowledge that Practitioner's advice, recommendations, and/or decision may be based on factors not within their control, such as incomplete or inaccurate data provided by me or distortions of diagnostic images or specimens that may result from electronic transmissions. I understand that the practice of medicine is not an exact science and that Practitioner makes no warranties or guarantees regarding treatment outcomes. I acknowledge that a copy of this consent can be made available to me via my patient portal [facility] MyChart), or I can request a printed copy by calling the office of  [facility].    I understand that my insurance will be billed for this visit.   I have read or had this consent read to me. I understand the contents of this consent, which adequately explains the benefits and risks of the Services being provided via telemedicine.  I have been provided ample opportunity to ask questions regarding this consent and the Services and have had my questions answered to my satisfaction. I give my informed consent for the services to be provided through the use of telemedicine in my medical care

## 2023-02-14 NOTE — Telephone Encounter (Signed)
2nd attempt to reach pt to schedule tele visit. No answer. Lvm

## 2023-02-14 NOTE — Telephone Encounter (Signed)
 Patient scheduled for tele visit on 02/25/23. Med rec and consent done

## 2023-02-14 NOTE — Telephone Encounter (Signed)
 Patient is returning call. Please advise?

## 2023-02-15 ENCOUNTER — Telehealth: Payer: Self-pay

## 2023-02-15 DIAGNOSIS — Z992 Dependence on renal dialysis: Secondary | ICD-10-CM | POA: Diagnosis not present

## 2023-02-15 DIAGNOSIS — E119 Type 2 diabetes mellitus without complications: Secondary | ICD-10-CM | POA: Diagnosis not present

## 2023-02-15 DIAGNOSIS — N186 End stage renal disease: Secondary | ICD-10-CM | POA: Diagnosis not present

## 2023-02-15 DIAGNOSIS — D509 Iron deficiency anemia, unspecified: Secondary | ICD-10-CM | POA: Diagnosis not present

## 2023-02-15 DIAGNOSIS — D631 Anemia in chronic kidney disease: Secondary | ICD-10-CM | POA: Diagnosis not present

## 2023-02-15 DIAGNOSIS — N250 Renal osteodystrophy: Secondary | ICD-10-CM | POA: Diagnosis not present

## 2023-02-15 DIAGNOSIS — M1712 Unilateral primary osteoarthritis, left knee: Secondary | ICD-10-CM | POA: Diagnosis not present

## 2023-02-15 NOTE — Telephone Encounter (Signed)
 Copied from CRM [id number]. Topic: General - Other >> Feb 15, 2023  3:14 PM Ja-Kwan M wrote: Reason for CRM: Pt stated that she received a message that she needs to schedule an appt with the office before having a colonoscopy. Pt stated that she is not scheduled for a colonoscopy and wanted to make the office aware so that the correct pt could be contacted.

## 2023-02-15 NOTE — Telephone Encounter (Signed)
 Patient is insistent she is not having any type of surgery or a colonoscopy

## 2023-02-17 DIAGNOSIS — D509 Iron deficiency anemia, unspecified: Secondary | ICD-10-CM | POA: Diagnosis not present

## 2023-02-17 DIAGNOSIS — E1122 Type 2 diabetes mellitus with diabetic chronic kidney disease: Secondary | ICD-10-CM | POA: Diagnosis not present

## 2023-02-17 DIAGNOSIS — N186 End stage renal disease: Secondary | ICD-10-CM | POA: Diagnosis not present

## 2023-02-17 DIAGNOSIS — I5022 Chronic systolic (congestive) heart failure: Secondary | ICD-10-CM | POA: Diagnosis not present

## 2023-02-17 DIAGNOSIS — N250 Renal osteodystrophy: Secondary | ICD-10-CM | POA: Diagnosis not present

## 2023-02-17 DIAGNOSIS — D631 Anemia in chronic kidney disease: Secondary | ICD-10-CM | POA: Diagnosis not present

## 2023-02-17 DIAGNOSIS — I5033 Acute on chronic diastolic (congestive) heart failure: Secondary | ICD-10-CM | POA: Diagnosis not present

## 2023-02-17 DIAGNOSIS — I132 Hypertensive heart and chronic kidney disease with heart failure and with stage 5 chronic kidney disease, or end stage renal disease: Secondary | ICD-10-CM | POA: Diagnosis not present

## 2023-02-17 DIAGNOSIS — Z992 Dependence on renal dialysis: Secondary | ICD-10-CM | POA: Diagnosis not present

## 2023-02-17 DIAGNOSIS — S0101XD Laceration without foreign body of scalp, subsequent encounter: Secondary | ICD-10-CM | POA: Diagnosis not present

## 2023-02-21 ENCOUNTER — Encounter: Payer: Self-pay | Admitting: Student in an Organized Health Care Education/Training Program

## 2023-02-21 ENCOUNTER — Ambulatory Visit
Payer: Medicare Other | Attending: Student in an Organized Health Care Education/Training Program | Admitting: Student in an Organized Health Care Education/Training Program

## 2023-02-21 VITALS — BP 139/53 | HR 75 | Temp 97.1°F | Resp 22 | Ht 66.0 in | Wt 198.0 lb

## 2023-02-21 DIAGNOSIS — M25562 Pain in left knee: Secondary | ICD-10-CM | POA: Diagnosis not present

## 2023-02-21 DIAGNOSIS — Z794 Long term (current) use of insulin: Secondary | ICD-10-CM | POA: Diagnosis not present

## 2023-02-21 DIAGNOSIS — E1140 Type 2 diabetes mellitus with diabetic neuropathy, unspecified: Secondary | ICD-10-CM | POA: Diagnosis not present

## 2023-02-21 DIAGNOSIS — G8929 Other chronic pain: Secondary | ICD-10-CM | POA: Diagnosis not present

## 2023-02-21 DIAGNOSIS — M1712 Unilateral primary osteoarthritis, left knee: Secondary | ICD-10-CM | POA: Diagnosis not present

## 2023-02-21 DIAGNOSIS — G894 Chronic pain syndrome: Secondary | ICD-10-CM | POA: Insufficient documentation

## 2023-02-21 NOTE — Progress Notes (Signed)
 PROVIDER NOTE: Information contained herein reflects review and annotations entered in association with encounter. Interpretation of such information and data should be left to medically-trained personnel. Information provided to patient can be located elsewhere in the medical record under Patient Instructions. Document created using STT-dictation technology, any transcriptional errors that may result from process are unintentional.    Patient: Jocelyn Wells  Service Category: E/M  Provider: Wallie Sherry, MD  DOB: [birth year]  DOS: 02/21/2023  Referring Provider: Emilio Kelly DASEN, FNP  MRN: [id number]  Specialty: Interventional Pain Management  PCP: Donzella Lauraine SAILOR, DO  Type: Established Patient  Setting: Ambulatory outpatient    Location: Office  Delivery: Face-to-face     HPI  [name], a 68 y.o. year old female, is here today because of her Primary osteoarthritis of left knee [M17.12]. [name] primary complain today is Knee Pain (left)  Pertinent problems: [name] has H/O: osteoarthritis; Anxiety, generalized; Arthritis, degenerative; Morbid obesity due to excess calories (HCC); Compression fracture of L1 lumbar vertebra (HCC); Compression fracture of body of thoracic vertebra (HCC); Lumbar foraminal stenosis (RIGHT L1); and Spinal stenosis of lumbar region with neurogenic claudication (L4-L5) on their pertinent problem list. Pain Assessment: Severity of Chronic pain is reported as a 10-Worst pain ever/10. Location: Knee Left/left lower leg. Onset: More than a month ago. Quality: Other (Comment) (excruciating). Timing: Constant. Modifying factor(s): rest, Tramadol . Vitals:  height is 5' 6 (1.676 m) and weight is 198 lb (89.8 kg). Her temporal temperature is 97.1 F (36.2 C) (abnormal). Her blood pressure is 139/53 (abnormal) and her pulse is 75. Her respiration is 22 (abnormal) and oxygen  saturation is 95%.  BMI: Estimated body mass index is 31.96 kg/m as calculated from the  following:   Height as of this encounter: 5' 6 (1.676 m).   Weight as of this encounter: 198 lb (89.8 kg). Last encounter: 10/25/2022. Last procedure: 09/13/2022.  Reason for encounter: patient-requested evaluation.  Increased left knee pain related to left knee osteoarthritis.  Patient went to [facility] last week for a left knee steroid injection which was not helpful.  She is here to discuss other options.   Pharmacotherapy Assessment  Analgesic: Tramadol  50 mg twice daily as needed, quantity 60/month    Monitoring: [place] PMP: PDMP not reviewed this encounter.       Pharmacotherapy: No side-effects or adverse reactions reported. Compliance: No problems identified. Effectiveness: Clinically acceptable.  [name], RN  02/21/2023  2:03 PM  Sign when Signing Visit Nursing Pain Medication Assessment:  Safety precautions to be maintained throughout the outpatient stay will include: orient to surroundings, keep bed in low position, maintain call bell within reach at all times, provide assistance with transfer out of bed and ambulation.  Medication Inspection Compliance: Pill count conducted under aseptic conditions, in front of the patient. Neither the pills nor the bottle was removed from the patient's sight at any time. Once count was completed pills were immediately returned to the patient in their original bottle.  Medication: Tramadol  (Ultram ) Pill/Patch Count:  6 of 60 pills remain Pill/Patch Appearance: Markings consistent with prescribed medication Bottle Appearance: Standard pharmacy container. Clearly labeled. Filled Date: 68 / 13 / 2024 Last Medication intake:  TodaySafety precautions to be maintained throughout the outpatient stay will include: orient to surroundings, keep bed in low position, maintain call bell within reach at all times, provide assistance with transfer out of bed and ambulation.   No results found for: CBDTHCR No results found for:  D8THCCBX No results  found for: D9THCCBX  UDS:  Summary  Date Value Ref Range Status  10/25/2022 Note  Final    Comment:    ==================================================================== [employer] Select 13 (MW) ==================================================================== Test                             Result       Flag       Units  Drug Present and Declared for Prescription Verification   Tramadol                        >13889       EXPECTED   ng/mg creat   O-Desmethyltramadol            3197         EXPECTED   ng/mg creat   N-Desmethyltramadol            7950         EXPECTED   ng/mg creat    Source of tramadol  is a prescription medication. O-desmethyltramadol    and N-desmethyltramadol are expected metabolites of tramadol .  Drug Present not Declared for Prescription Verification   Alcohol, Ethyl                 0.041        UNEXPECTED g/dL    Sources of ethyl alcohol include alcoholic beverages or as a    fermentation product of glucose; glucose is present in this specimen.    Interpret result with caution, as the presence of ethyl alcohol is    likely due, at least in part, to fermentation of glucose.  ==================================================================== Test                      Result    Flag   Units      Ref Range   Creatinine              36               mg/dL      >=79 ==================================================================== Declared Medications:  The flagging and interpretation on this report are based on the  following declared medications.  Unexpected results may arise from  inaccuracies in the declared medications.   **Note: The testing scope of this panel includes these medications:   Tramadol  (Ultram )   **Note: The testing scope of this panel does not include the  following reported medications:   Albuterol   Apixaban  (Eliquis )  Atorvastatin  (Lipitor)  Bupropion  (Wellbutrin  SR)  Calcitriol   Dulaglutide  (Trulicity )  Fluticasone   (Flonase )  Gabapentin  (Neurontin )  Helium  Hydroxyurea  (Hydrea )  Insulin  (NovoLog )  Loratadine  (Claritin )  Meclizine  (Antivert )  Oxygen   Paroxetine  (Paxil )  Torsemide  (Demadex )  Vitamin D2 (Drisdol ) ==================================================================== For clinical consultation, please call [phone]. ====================================================================       ROS  Constitutional: Denies any fever or chills Gastrointestinal: No reported hemesis, hematochezia, vomiting, or acute GI distress Musculoskeletal:  Left knee pain Neurological: No reported episodes of acute onset apraxia, aphasia, dysarthria, agnosia, amnesia, paralysis, loss of coordination, or loss of consciousness  Medication Review  Dulaglutide , Omnipod DASH Pods (Gen 4), Oxygen -Helium, PARoxetine , Tiotropium Bromide-Olodaterol, Vitamin D  (Ergocalciferol ), albuterol , apixaban , atorvastatin , buPROPion , calcitRIOL , fluticasone , gabapentin , glucose blood, hydroxyurea , insulin  aspart, insulin  pump, loratadine , meclizine , optichamber diamond , oxyCODONE , and torsemide   History Review  Allergy: Ms. Morocco is allergic to codeine and ivp dye [iodinated contrast media]. Drug: Ms. Coger  reports no history of drug use. Alcohol:  reports no history of alcohol use. Tobacco:  reports that she has been smoking cigarettes. She started smoking about 49 years ago. She has a 24.2 pack-year smoking history. She has never used smokeless tobacco. Social: Ms. Renninger  reports that she has been smoking cigarettes. She started smoking about 49 years ago. She has a 24.2 pack-year smoking history. She has never used smokeless tobacco. She reports that she does not drink alcohol and does not use drugs. Medical:  has a past medical history of Acute ischemic left MCA stroke (HCC) (01/29/2015), Anemia of chronic renal failure, Anxiety, Arthritis, Complication of anesthesia, COPD (chronic obstructive pulmonary  disease) (HCC), Depression, Dyspnea, ESRD (end stage renal disease) (HCC), Headache, HFrEF (heart failure with reduced ejection fraction) (HCC), HTN (hypertension), Leukocytosis, Long term current use of anticoagulant, Long term current use of antithrombotics/antiplatelets, NICM (nonischemic cardiomyopathy) (HCC), Obesity, On supplemental oxygen  by nasal cannula, Osteoporosis, Peripheral vascular disease (HCC), and Type 2 diabetes mellitus treated with insulin  (HCC). Surgical: Ms. Boulay  has a past surgical history that includes Abdominal hysterectomy; Tubal ligation (02/09/1976); Cesarean section (02/09/1976); Knee arthroscopy (Left, 02/09/2003); Ulnar nerve transposition (02/08/2006); Bilateral salpingoophorectomy (02/08/1998); Ankle fracture surgery (Left, 02/09/2000); TEE without cardioversion (N/A, 01/31/2015); Cardiac catheterization (N/A, 03/25/2015); Cardiac catheterization (N/A, 08/05/2015); Esophagogastroduodenoscopy (egd) with propofol  (N/A, 09/22/2016); Colonoscopy with propofol  (N/A, 09/22/2016); Esophagogastroduodenoscopy (egd) with propofol  (N/A, 12/08/2016); Lower Extremity Angiography (Left, 08/12/2021); TEMPORARY DIALYSIS CATHETER (N/A, 08/14/2021); Fracture surgery; Colonoscopy with propofol  (N/A, 02/15/2022); Esophagogastroduodenoscopy (N/A, 02/15/2022); Givens capsule study (N/A, 04/19/2022); AV fistula placement (Left, 06/18/2022); Insertion hybrid anteriovenous gortex graft (06/18/2022); Esophagogastroduodenoscopy (egd) with propofol  (N/A, 10/12/2022); Givens capsule study (N/A, 10/12/2022); Facial laceration repair (N/A, 12/27/2022); PERIPHERAL VASCULAR THROMBECTOMY (Left, 01/19/2023); and DIALYSIS/PERMA CATHETER INSERTION (N/A, 01/19/2023). Family: family history includes Alcohol abuse in her paternal aunt; Aneurysm in her father; Asthma in her mother; COPD in her brother; Cancer in her maternal grandmother; Diabetes in her brother and mother; Heart disease in her father and mother.  Laboratory  Chemistry Profile   Renal Lab Results  Component Value Date   BUN 54 (H) 01/17/2023   CREATININE 4.39 (H) 01/17/2023   LABCREA 3.7 04/22/2022   BCR 15 12/04/2021   GFRAA 33 (L) 04/23/2019   GFRNONAA 10 (L) 01/17/2023    Hepatic Lab Results  Component Value Date   AST 11 (L) 12/28/2022   ALT 9 12/28/2022   ALBUMIN  2.8 (L) 12/31/2022   ALKPHOS 72 12/28/2022    Electrolytes Lab Results  Component Value Date   NA 137 01/17/2023   K 4.3 01/17/2023   CL 102 01/17/2023   CALCIUM  9.4 01/17/2023   MG 1.8 12/28/2022   PHOS 3.2 12/31/2022    Bone No results found for: VD25OH, VD125OH2TOT, CI6874NY7, CI7874NY7, 25OHVITD1, 25OHVITD2, 25OHVITD3, TESTOFREE, TESTOSTERONE  Inflammation (CRP: Acute Phase) (ESR: Chronic Phase) Lab Results  Component Value Date   ESRSEDRATE 23 10/20/2020   LATICACIDVEN 1.4 05/31/2020         Note: Above Lab results reviewed.  Recent Imaging Review  PERIPHERAL VASCULAR CATHETERIZATION See surgical note for result. Note: Reviewed        Physical Exam  General appearance: Well nourished, well developed, and well hydrated. In no apparent acute distress Mental status: Alert, oriented x 3 (person, place, & time)       Respiratory: No evidence of acute respiratory distress Eyes: PERLA Vitals: BP (!) 139/53   Pulse 75   Temp (!) 97.1 F (36.2 C) (Temporal)  Resp (!) 22   Ht 5' 6 (1.676 m)   Wt 198 lb (89.8 kg)   SpO2 95%   BMI 31.96 kg/m  BMI: Estimated body mass index is 31.96 kg/m as calculated from the following:   Height as of this encounter: 5' 6 (1.676 m).   Weight as of this encounter: 198 lb (89.8 kg). Ideal: Ideal body weight: 59.3 kg (130 lb 11.7 oz) Adjusted ideal body weight: 71.5 kg (157 lb 10.2 oz)  Patient presents today in wheelchair Tenderness to palpation over left knee worse in the medial compartment  Assessment   Diagnosis Status  1. Primary osteoarthritis of left knee   2. Chronic pain of left  knee   3. Chronic painful diabetic neuropathy (HCC)   4. Chronic pain syndrome    Having a Flare-up Having a Flare-up Controlled   Updated Problems: No problems updated.  Plan of Care  Discussed left knee genicular nerve block for knee pain related to knee osteoarthritis in the context of having failed physical therapy, home exercise, NSAIDs, left knee intra-articular steroid injection Orders:  Orders Placed This Encounter  Procedures   GENICULAR NERVE BLOCK    Indication(s):  Sub-acute knee pain    Standing Status:   Future    Expected Date:   03/07/2023    Expiration Date:   05/22/2023    Scheduling Instructions:     Side: LEFT     Sedation: PO Valium      Timeframe: As soon as schedule allows    Where will this procedure be performed?:   [facility]   Follow-up plan:   Return for Left GNB , in clinic (PO Valium  5mg ).      Recent Visits No visits were found meeting these conditions. Showing recent visits within past 90 days and meeting all other requirements Today's Visits Date Type Provider Dept  02/21/23 Office Visit Marcelino Nurse, MD [facility]  Showing today's visits and meeting all other requirements Future Appointments No visits were found meeting these conditions. Showing future appointments within next 90 days and meeting all other requirements  I discussed the assessment and treatment plan with the patient. The patient was provided an opportunity to ask questions and all were answered. The patient agreed with the plan and demonstrated an understanding of the instructions.  Patient advised to call back or seek an in-person evaluation if the symptoms or condition worsens.  Duration of encounter: .  Total time on encounter, as per AMA guidelines included both the face-to-face and non-face-to-face time personally spent by the physician and/or other qualified health care professional(s) on the day of the encounter (includes time in  activities that require the physician or other qualified health care professional and does not include time in activities normally performed by clinical staff). Physician's time may include the following activities when performed: Preparing to see the patient (e.g., pre-charting review of records, searching for previously ordered imaging, lab work, and nerve conduction tests) Review of prior analgesic pharmacotherapies. Reviewing PMP Interpreting ordered tests (e.g., lab work, imaging, nerve conduction tests) Performing post-procedure evaluations, including interpretation of diagnostic procedures Obtaining and/or reviewing separately obtained history Performing a medically appropriate examination and/or evaluation Counseling and educating the patient/family/caregiver Ordering medications, tests, or procedures Referring and communicating with other health care professionals (when not separately reported) Documenting clinical information in the electronic or other health record Independently interpreting results (not separately reported) and communicating results to the patient/ family/caregiver Care coordination (not separately reported)  Note  by: Wallie Sherry, MD Date: 02/21/2023; Time: 2:55 PM

## 2023-02-21 NOTE — Patient Instructions (Addendum)
 Tylenol  500 mg every 6-8 hours  ______________________________________________________________________    Preparing for your procedure  Appointments: If you think you may not be able to keep your appointment, call 24-48 hours in advance to cancel. We need time to make it available to others.  Procedure visits are for procedures only. During your procedure appointment there will be: NO Prescription Refills*. NO medication changes or discussions*. NO discussion of disability issues*. NO unrelated pain problem evaluations*. NO evaluations to order other pain procedures*. *These will be addressed at a separate and distinct evaluation encounter on the provider's evaluation schedule and not during procedure days.  Instructions: Food intake: Avoid eating anything solid for at least 8 hours prior to your procedure. Clear liquid intake: You may take clear liquids such as water up to 2 hours prior to your procedure. (No carbonated drinks. No soda.) Transportation: Unless otherwise stated by your physician, bring a driver. (Driver cannot be a Market Researcher, Pharmacist, Community, or any other form of public transportation.) Morning Medicines: Except for blood thinners, take all of your other morning medications with a sip of water. Make sure to take your heart and blood pressure medicines. If your blood pressure's lower number is above 100, the case will be rescheduled. Blood thinners: Make sure to stop your blood thinners as instructed.  If you take a blood thinner, but were not instructed to stop it, call our office [phone] and ask to talk to a nurse. Not stopping a blood thinner prior to certain procedures could lead to serious complications. Diabetics on insulin : Notify the staff so that you can be scheduled 1st case in the morning. If your diabetes requires high dose insulin , take only  of your normal insulin  dose the morning of the procedure and notify the staff that you have done so. Preventing infections: Shower  with an antibacterial soap the morning of your procedure.  Build-up your immune system: Take 1000 mg of Vitamin C  with every meal (3 times a day) the day prior to your procedure. Antibiotics: Inform the nursing staff if you are taking any antibiotics or if you have any conditions that may require antibiotics prior to procedures. (Example: recent joint implants)   Pregnancy: If you are pregnant make sure to notify the nursing staff. Not doing so may result in injury to the fetus, including death.  Sickness: If you have a cold, fever, or any active infections, call and cancel or reschedule your procedure. Receiving steroids while having an infection may result in complications. Arrival: You must be in the facility at least 30 minutes prior to your scheduled procedure. Tardiness: Your scheduled time is also the cutoff time. If you do not arrive at least 15 minutes prior to your procedure, you will be rescheduled.  Children: Do not bring any children with you. Make arrangements to keep them home. Dress appropriately: There is always a possibility that your clothing may get soiled. Avoid long dresses. Valuables: Do not bring any jewelry or valuables.  Reasons to call and reschedule or cancel your procedure: (Following these recommendations will minimize the risk of a serious complication.) Surgeries: Avoid having procedures within 2 weeks of any surgery. (Avoid for 2 weeks before or after any surgery). Flu Shots: Avoid having procedures within 2 weeks of a flu shots or . (Avoid for 2 weeks before or after immunizations). Barium: Avoid having a procedure within 7-10 days after having had a radiological study involving the use of radiological contrast. (Myelograms, Barium swallow or enema study). Heart attacks: Avoid  any elective procedures or surgeries for the initial 6 months after a Myocardial Infarction (Heart Attack). Blood thinners: It is imperative that you stop these medications before procedures.  Let us  know if you if you take any blood thinner.  Infection: Avoid procedures during or within two weeks of an infection (including chest colds or gastrointestinal problems). Symptoms associated with infections include: Localized redness, fever, chills, night sweats or profuse sweating, burning sensation when voiding, cough, congestion, stuffiness, runny nose, sore throat, diarrhea, nausea, vomiting, cold or Flu symptoms, recent or current infections. It is specially important if the infection is over the area that we intend to treat. Heart and lung problems: Symptoms that may suggest an active cardiopulmonary problem include: cough, chest pain, breathing difficulties or shortness of breath, dizziness, ankle swelling, uncontrolled high or unusually low blood pressure, and/or palpitations. If you are experiencing any of these symptoms, cancel your procedure and contact your primary care physician for an evaluation.  Remember:  Regular Business hours are:  Monday to Thursday 8:00 AM to 4:00 PM  Provider's Schedule: Eric Como, MD:  Procedure days: Tuesday and Thursday 7:30 AM to 4:00 PM  Wallie Sherry, MD:  Procedure days: Monday and Wednesday 7:30 AM to 4:00 PM Last  Updated: 01/18/2023 ______________________________________________________________________

## 2023-02-21 NOTE — Progress Notes (Signed)
 Nursing Pain Medication Assessment:  Safety precautions to be maintained throughout the outpatient stay will include: orient to surroundings, keep bed in low position, maintain call bell within reach at all times, provide assistance with transfer out of bed and ambulation.  Medication Inspection Compliance: Pill count conducted under aseptic conditions, in front of the patient. Neither the pills nor the bottle was removed from the patient's sight at any time. Once count was completed pills were immediately returned to the patient in their original bottle.  Medication: Tramadol  (Ultram ) Pill/Patch Count:  6 of 60 pills remain Pill/Patch Appearance: Markings consistent with prescribed medication Bottle Appearance: Standard pharmacy container. Clearly labeled. Filled Date: 47 / 13 / 2024 Last Medication intake:  TodaySafety precautions to be maintained throughout the outpatient stay will include: orient to surroundings, keep bed in low position, maintain call bell within reach at all times, provide assistance with transfer out of bed and ambulation.

## 2023-02-22 DIAGNOSIS — N186 End stage renal disease: Secondary | ICD-10-CM | POA: Diagnosis not present

## 2023-02-22 DIAGNOSIS — D631 Anemia in chronic kidney disease: Secondary | ICD-10-CM | POA: Diagnosis not present

## 2023-02-22 DIAGNOSIS — D509 Iron deficiency anemia, unspecified: Secondary | ICD-10-CM | POA: Diagnosis not present

## 2023-02-22 DIAGNOSIS — Z992 Dependence on renal dialysis: Secondary | ICD-10-CM | POA: Diagnosis not present

## 2023-02-22 DIAGNOSIS — N250 Renal osteodystrophy: Secondary | ICD-10-CM | POA: Diagnosis not present

## 2023-02-23 ENCOUNTER — Telehealth: Payer: Self-pay | Admitting: Pharmacist

## 2023-02-23 NOTE — Progress Notes (Signed)
   02/23/2023  Patient ID: Jocelyn Wells, female   DOB: Jul 06, 1955, 68 y.o.   MRN: [id number]  Tried calling the patient for a follow-up call today. Unable to reach and left a HIPAA compliant voicemail requesting call back at earliest convenience. Will try again in 2 weeks for final attempt.   For questions: Patient has NOT picked up Oxycodone  still  Missing fills for: Calcitriol , Gabapentin , Trulicity , and Stiolto   Kenneth Peace, PharmD [facility] Phone Number: [phone]

## 2023-02-25 ENCOUNTER — Ambulatory Visit: Payer: Medicare Other | Attending: Cardiovascular Disease | Admitting: Nurse Practitioner

## 2023-02-25 ENCOUNTER — Telehealth: Payer: Self-pay | Admitting: Family Medicine

## 2023-02-25 ENCOUNTER — Ambulatory Visit: Payer: Self-pay | Admitting: *Deleted

## 2023-02-25 DIAGNOSIS — I5033 Acute on chronic diastolic (congestive) heart failure: Secondary | ICD-10-CM | POA: Diagnosis not present

## 2023-02-25 DIAGNOSIS — E1151 Type 2 diabetes mellitus with diabetic peripheral angiopathy without gangrene: Secondary | ICD-10-CM | POA: Diagnosis not present

## 2023-02-25 DIAGNOSIS — I5022 Chronic systolic (congestive) heart failure: Secondary | ICD-10-CM | POA: Diagnosis not present

## 2023-02-25 DIAGNOSIS — E113319 Type 2 diabetes mellitus with moderate nonproliferative diabetic retinopathy with macular edema, unspecified eye: Secondary | ICD-10-CM | POA: Diagnosis not present

## 2023-02-25 DIAGNOSIS — Z01810 Encounter for preprocedural cardiovascular examination: Secondary | ICD-10-CM | POA: Diagnosis not present

## 2023-02-25 DIAGNOSIS — N186 End stage renal disease: Secondary | ICD-10-CM | POA: Diagnosis not present

## 2023-02-25 DIAGNOSIS — D631 Anemia in chronic kidney disease: Secondary | ICD-10-CM | POA: Diagnosis not present

## 2023-02-25 DIAGNOSIS — E1122 Type 2 diabetes mellitus with diabetic chronic kidney disease: Secondary | ICD-10-CM | POA: Diagnosis not present

## 2023-02-25 DIAGNOSIS — I132 Hypertensive heart and chronic kidney disease with heart failure and with stage 5 chronic kidney disease, or end stage renal disease: Secondary | ICD-10-CM | POA: Diagnosis not present

## 2023-02-25 DIAGNOSIS — S0101XD Laceration without foreign body of scalp, subsequent encounter: Secondary | ICD-10-CM | POA: Diagnosis not present

## 2023-02-25 DIAGNOSIS — J449 Chronic obstructive pulmonary disease, unspecified: Secondary | ICD-10-CM | POA: Diagnosis not present

## 2023-02-25 DIAGNOSIS — E1140 Type 2 diabetes mellitus with diabetic neuropathy, unspecified: Secondary | ICD-10-CM | POA: Diagnosis not present

## 2023-02-25 NOTE — Progress Notes (Signed)
Virtual Visit via Telephone Note   Because of Jocelyn Wells's co-morbid illnesses, she is at least at moderate risk for complications without adequate follow up.  This format is felt to be most appropriate for this patient at this time.  The patient did not have access to video technology/had technical difficulties with video requiring transitioning to audio format only (telephone).  All issues noted in this document were discussed and addressed.  No physical exam could be performed with this format.  Please refer to the patient's chart for her consent to telehealth for [facility].  Evaluation Performed:  Preoperative cardiovascular risk assessment _____________   Date:  02/25/2023   Patient ID:  Jocelyn Wells, DOB 09-06-1955, MRN [id number] Patient Location:  Home Provider location:   Office  Primary Care Provider:  Sherlyn Hay, DO Primary Cardiologist:  None  Chief Complaint / Patient Profile   68 y.o. y/o female with a h/o nonischemic cardiomyopathy with normalization of EF, PAD, CVA, chronic DVT, type 2 diabetes, ESRD on HD, anemia, COPD with former tobacco use, and lumbar spinal stenosis with chronic severe back pain who is pending vitrectomy with Dr. Stephannie Li of [facility] and presents today for telephonic preoperative cardiovascular risk assessment.  History of Present Illness    Jocelyn Wells is a 68 y.o. female who presents via audio/video conferencing for a telehealth visit today.  Pt was last seen in cardiology clinic on 11/17/2022 by Jocelyn Quest, NP.  At that time Jocelyn Wells was doing well. The patient is now pending procedure as outlined above. Since her last visit, she has done well from a cardiac standpoint.  She denies chest pain, palpitations, dyspnea, pnd, orthopnea, n, v, dizziness, syncope, edema, weight gain, or early satiety. All other systems reviewed and are otherwise negative except as noted above.   Past Medical History     Past Medical History:  Diagnosis Date   Acute ischemic left MCA stroke (HCC) 01/29/2015   a.) MRI brain 01/29/2015: acute left MCA territory infarcts involving the posterior left frontal lobe and left insular cortex   Anemia of chronic renal failure    Anxiety    Arthritis    Complication of anesthesia    a.) PONV; b.) awareness under anesthesia   COPD (chronic obstructive pulmonary disease) (HCC)    Depression    Dyspnea    ESRD (end stage renal disease) (HCC)    Headache    HFrEF (heart failure with reduced ejection fraction) (HCC)    a.) TTE 01/30/2015: EF 30-35%, diff HK, LAE, G2DD; b.) TTE 06/13/2015: EF 40-45%, diff HK, G1DD; c.) TTE 10/06/2017: EF 60-65% MAC, no IAS; d.) TTE 09/29/2021: EF 45-50%;, LVH, LAE, AoV sclerosis, G1DD; e.) TTE 02/24/2022: EF 60-65%, no IAS   HTN (hypertension)    Leukocytosis    Long term current use of anticoagulant    a.) apixaban   Long term current use of antithrombotics/antiplatelets    a.) clopidogrel   NICM (nonischemic cardiomyopathy) (HCC)    a.) TTE 01/30/2015: EF 30-35%; b.) R/LHC 03/25/2015: EF 25-30%, mRA 13, mPA 30, mPCWP 26, LVEDP 17, CO 3.66, CI 1.8; c.) TTE 06/13/2015: EF 40-45%; d.) TTE 10/06/2017: EF 60-65%; e.) TTE 09/29/2021: EF 45-50%; f.) TTE 02/24/2022: EF 60-65%   Obesity    On supplemental oxygen by nasal cannula    a.) 2L/[place] at night   Osteoporosis    Peripheral vascular disease (HCC)    Type 2 diabetes mellitus treated  with insulin [facility])    Past Surgical History:  Procedure Laterality Date   ABDOMINAL HYSTERECTOMY     ANKLE FRACTURE SURGERY Left 02/09/2000   AV FISTULA PLACEMENT Left 06/18/2022   Procedure: ARTERIOVENOUS (AV) FISTULA CREATION (BRACHIAL AXILLA);  Surgeon: Renford Dills, MD;  Location: [facility] ORS;  Service: Vascular;  Laterality: Left;   BILATERAL SALPINGOOPHORECTOMY  02/08/1998   CARDIAC CATHETERIZATION N/A 03/25/2015   Procedure: Right/Left Heart Cath and Coronary Angiography;  Surgeon: Lyn Records, MD;  Location: [facility] INVASIVE CV LAB;  Service: Cardiovascular;  Laterality: N/A;   CESAREAN SECTION  02/09/1976   COLONOSCOPY WITH PROPOFOL N/A 09/22/2016   Procedure: COLONOSCOPY WITH PROPOFOL;  Surgeon: Scot Jun, MD;  Location: [facility] ENDOSCOPY;  Service: Endoscopy;  Laterality: N/A;   COLONOSCOPY WITH PROPOFOL N/A 02/15/2022   Procedure: COLONOSCOPY WITH PROPOFOL;  Surgeon: Toney Reil, MD;  Location: [facility] ENDOSCOPY;  Service: Gastroenterology;  Laterality: N/A;   DIALYSIS/PERMA CATHETER INSERTION N/A 01/19/2023   Procedure: DIALYSIS/PERMA CATHETER INSERTION;  Surgeon: Annice Needy, MD;  Location: [facility] INVASIVE CV LAB;  Service: Cardiovascular;  Laterality: N/A;   EP IMPLANTABLE DEVICE N/A 08/05/2015   Procedure: Loop Recorder Insertion;  Surgeon: Duke Salvia, MD;  Location: [facility] INVASIVE CV LAB;  Service: Cardiovascular;  Laterality: N/A;   ESOPHAGOGASTRODUODENOSCOPY N/A 02/15/2022   Procedure: ESOPHAGOGASTRODUODENOSCOPY (EGD);  Surgeon: Toney Reil, MD;  Location: [facility] ENDOSCOPY;  Service: Gastroenterology;  Laterality: N/A;   ESOPHAGOGASTRODUODENOSCOPY (EGD) WITH PROPOFOL N/A 09/22/2016   Procedure: ESOPHAGOGASTRODUODENOSCOPY (EGD) WITH PROPOFOL;  Surgeon: Scot Jun, MD;  Location: [facility] ENDOSCOPY;  Service: Endoscopy;  Laterality: N/A;   ESOPHAGOGASTRODUODENOSCOPY (EGD) WITH PROPOFOL N/A 12/08/2016   Procedure: ESOPHAGOGASTRODUODENOSCOPY (EGD) WITH PROPOFOL;  Surgeon: Scot Jun, MD;  Location: [facility] ENDOSCOPY;  Service: Endoscopy;  Laterality: N/A;   ESOPHAGOGASTRODUODENOSCOPY (EGD) WITH PROPOFOL N/A 10/12/2022   Procedure: ESOPHAGOGASTRODUODENOSCOPY (EGD) WITH PROPOFOL;  Surgeon: Toney Reil, MD;  Location: [facility] ENDOSCOPY;  Service: Gastroenterology;  Laterality: N/A;   FACIAL LACERATION REPAIR N/A 12/27/2022   Procedure: FACIAL LACERATION REPAIR;  Surgeon: Scarlette Ar, MD;  Location: [facility] OR;  Service: ENT;  Laterality: N/A;   FRACTURE  SURGERY     GIVENS CAPSULE STUDY N/A 04/19/2022   Procedure: GIVENS CAPSULE STUDY;  Surgeon: Toney Reil, MD;  Location: [facility] ENDOSCOPY;  Service: Gastroenterology;  Laterality: N/A;   GIVENS CAPSULE STUDY N/A 10/12/2022   Procedure: GIVENS CAPSULE STUDY;  Surgeon: Toney Reil, MD;  Location: [facility] ENDOSCOPY;  Service: Gastroenterology;  Laterality: N/A;   INSERTION HYBRID ANTERIOVENOUS GORTEX GRAFT  06/18/2022   Procedure: INSERTION HYBRID ANTERIOVENOUS GORTEX GRAFT;  Surgeon: Renford Dills, MD;  Location: [facility] ORS;  Service: Vascular;;   KNEE ARTHROSCOPY Left 02/09/2003   LOWER EXTREMITY ANGIOGRAPHY Left 08/12/2021   Procedure: Lower Extremity Angiography;  Surgeon: Renford Dills, MD;  Location: [facility] INVASIVE CV LAB;  Service: Cardiovascular;  Laterality: Left;   PERIPHERAL VASCULAR THROMBECTOMY Left 01/19/2023   Procedure: PERIPHERAL VASCULAR THROMBECTOMY;  Surgeon: Annice Needy, MD;  Location: [facility] INVASIVE CV LAB;  Service: Cardiovascular;  Laterality: Left;   TEE WITHOUT CARDIOVERSION N/A 01/31/2015   Procedure: TRANSESOPHAGEAL ECHOCARDIOGRAM (TEE);  Surgeon: Lewayne Bunting, MD;  Location: [facility] ENDOSCOPY;  Service: Cardiovascular;  Laterality: N/A;   TEMPORARY DIALYSIS CATHETER N/A 08/14/2021   Procedure: TEMPORARY DIALYSIS CATHETER;  Surgeon: Renford Dills, MD;  Location: [facility] INVASIVE CV LAB;  Service: Cardiovascular;  Laterality: N/A;   TUBAL LIGATION  02/09/1976  ULNAR NERVE TRANSPOSITION  02/08/2006    Allergies  Allergies  Allergen Reactions   Codeine Nausea And Vomiting   Ivp Dye [Iodinated Contrast Media]     Patients states the IV Dye shuts her kidneys down    Home Medications    Prior to Admission medications   Medication Sig Start Date End Date Taking? Authorizing Provider  albuterol (VENTOLIN HFA) 108 (90 Base) MCG/ACT inhaler TAKE 2 PUFFS BY MOUTH EVERY 6 HOURS AS NEEDED FOR WHEEZE OR SHORTNESS OF BREATH Patient taking differently:  Inhale 2 puffs into the lungs every 6 (six) hours as needed for wheezing or shortness of breath. 06/19/21   Jacky Kindle, FNP  apixaban (ELIQUIS) 5 MG TABS tablet Take 1 tablet (5 mg total) by mouth 2 (two) times daily. 11/29/22   Jocelyn Quest, NP  atorvastatin (LIPITOR) 40 MG tablet Take 1 tablet (40 mg total) by mouth daily. 09/08/21   Jacky Kindle, FNP  buPROPion [facility] SR) 150 MG 12 hr tablet TAKE 1 TABLET BY MOUTH TWICE A DAY 12/08/22   Merita Norton T, FNP  calcitRIOL (ROCALTROL) 0.25 MCG capsule Take 0.25 mcg by mouth daily. 03/15/22   [provider]  Dulaglutide (TRULICITY) 4.5 MG/0.5ML SOPN Inject 4.5 mg into the skin every 7 (seven) days. Patient taking differently: Inject 4.5 mg into the skin every 7 (seven) days. Mondays 07/12/22     fluticasone (FLONASE) 50 MCG/ACT nasal spray Place 2 sprays into both nostrils daily. Patient taking differently: Place 2 sprays into both nostrils as needed. 02/13/21   Jacky Kindle, FNP  gabapentin (NEURONTIN) 300 MG capsule Take 300 mg by mouth daily.    [provider]  glucose blood test strip     [provider]  hydroxyurea (HYDREA) 500 MG capsule TAKE 1 CAPSULE BY MOUTH EVERY OTHER DAY. MAY TAKE WITH FOOD TO MINIMIZE GI SIDE EFFECTS. 01/03/23   Creig Hines, MD  insulin aspart (NOVOLOG) 100 UNIT/ML injection Insulin pump 05/26/20   [provider]  Insulin Disposable Pump (OMNIPOD DASH PODS, GEN 4,) MISC USE 1 POD EACH EVERY 72    HOURS 10/29/20   [provider]  Insulin Human (INSULIN PUMP) SOLN Per endocrinoloy Patient not taking: Reported on 02/23/2023 06/01/20   Elgergawy, Leana Roe, MD  loratadine (CLARITIN) 10 MG tablet Take 1 tablet by mouth daily.    [provider]  meclizine (ANTIVERT) 12.5 MG tablet Take 1 tablet (12.5 mg total) by mouth 3 (three) times daily as needed for dizziness. 11/23/21   Caro Laroche, DO  oxyCODONE (OXY IR/ROXICODONE) 5 MG immediate release tablet Take 1  tablet (5 mg total) by mouth every 6 (six) hours as needed for moderate pain (pain score 4-6), severe pain (pain score 7-10) or breakthrough pain. Patient not taking: Reported on 02/23/2023 01/05/23   Jacky Kindle, FNP  OXYGEN Place 2 L into the nose continuous.    [provider]  PARoxetine (PAXIL) 40 MG tablet Take 1 tablet (40 mg total) by mouth daily. 04/10/22   Neysa Hotter, MD  Spacer/Aero-Holding Chambers [facility] DI[facility]OND) MISC Use with inhaler to ensure medication is delivered throughout lungs 02/13/21   Jacky Kindle, FNP  Tiotropium Bromide-Olodaterol (STIOLTO RESPIMAT) 2.5-2.5 MCG/ACT AERS Inhale 2 puffs into the lungs daily. 11/29/22   Salena Saner, MD  torsemide (DEMADEX) 20 MG tablet TAKE 3 TABLETS BY MOUTH EVERY DAY Patient taking differently: Take 40 mg by mouth daily. TAKE 2 TABLETS BY MOUTH  EVERY DAY 11/04/22   Delma Freeze, FNP  Vitamin D, Ergocalciferol, (DRISDOL) 1.25 MG (50000 UNIT) CAPS capsule Take 50,000 Units by mouth every 7 (seven) days. Sunday 01/24/22   [provider]    Physical Exam    Vital Signs:  Amunique A Saputo does not have vital signs available for review today.  Given telephonic nature of communication, physical exam is limited. AAOx3. NAD. Normal affect.  Speech and respirations are unlabored.  Accessory Clinical Findings    None  Assessment & Plan    1.  Preoperative Cardiovascular Risk Assessment:  According to the Revised Cardiac Risk Index (RCRI), her Perioperative Risk of Major Cardiac Event is (%): 11. Her Functional Capacity in METs is: 4.27 according to the [facility] Activity Status Index (DASI). Patient is high risk for surgery based on multiple comorbidities, however, based on ACC/AHA guidelines, patient would be at acceptable risk for the planned procedure without further cardiovascular testing.   The patient was advised that if she develops new symptoms prior to surgery to contact our office to arrange for a  follow-up visit, and she verbalized understanding.  Cardiology does not manage patient's Eliquis.  Recommendations for holding Eliquis prior to procedure should come from managing provider (PCP).  A copy of this note will be routed to requesting surgeon.  Time:   Today, I have spent 5 minutes with the patient with telehealth technology discussing medical history, symptoms, and management plan.     Joylene Grapes, NP  02/25/2023, 2:40 PM

## 2023-02-25 NOTE — Telephone Encounter (Signed)
Home health certification paperwork come in via fax today and front office portion completed placed in provider mailbox VM

## 2023-02-25 NOTE — Telephone Encounter (Signed)
Reason for Disposition  [1] Follow-up call to recent contact AND [2] information only call, no triage required  Answer Assessment - Initial Assessment Questions 1. REASON FOR CALL or QUESTION: "What is your reason for calling today?" or "How can I best help you?" or "What question do you have that I can help answer?"     [name] with [facility] calling in for the status of verbal orders requested back around Dec. 16, 2024.   "I never heard anything back".  Merita Norton, FNP gave ok for verbal orders on 01/25/2023 at 3:17 PM.    I let [name] know this.   I also let [name] know pt will be seeing Dr. Payton Mccallum now that Merita Norton, FNP is no longer with the practice.  Protocols used: Information Only Call - No Triage-A-AH

## 2023-02-25 NOTE — Telephone Encounter (Signed)
  Chief Complaint: [name] with [facility] called in for verbal order status.   Given message from Merita Norton, FNP   See notes. Symptoms: N/A Frequency: N/A Pertinent Negatives: Patient denies N/A Disposition: [] ED /[] Urgent Care (no appt availability in office) / [] Appointment(In office/virtual)/ []  [facility]/ [] Home Care/ [] Refused Recommended Disposition /[] [facility]/ []  Follow-up with PCP Additional Notes:

## 2023-02-26 DIAGNOSIS — N186 End stage renal disease: Secondary | ICD-10-CM | POA: Diagnosis not present

## 2023-02-26 DIAGNOSIS — D509 Iron deficiency anemia, unspecified: Secondary | ICD-10-CM | POA: Diagnosis not present

## 2023-02-26 DIAGNOSIS — Z992 Dependence on renal dialysis: Secondary | ICD-10-CM | POA: Diagnosis not present

## 2023-02-26 DIAGNOSIS — N250 Renal osteodystrophy: Secondary | ICD-10-CM | POA: Diagnosis not present

## 2023-02-26 DIAGNOSIS — D631 Anemia in chronic kidney disease: Secondary | ICD-10-CM | POA: Diagnosis not present

## 2023-03-01 DIAGNOSIS — D631 Anemia in chronic kidney disease: Secondary | ICD-10-CM | POA: Diagnosis not present

## 2023-03-01 DIAGNOSIS — N186 End stage renal disease: Secondary | ICD-10-CM | POA: Diagnosis not present

## 2023-03-01 DIAGNOSIS — N250 Renal osteodystrophy: Secondary | ICD-10-CM | POA: Diagnosis not present

## 2023-03-01 DIAGNOSIS — Z992 Dependence on renal dialysis: Secondary | ICD-10-CM | POA: Diagnosis not present

## 2023-03-01 DIAGNOSIS — D509 Iron deficiency anemia, unspecified: Secondary | ICD-10-CM | POA: Diagnosis not present

## 2023-03-03 DIAGNOSIS — D509 Iron deficiency anemia, unspecified: Secondary | ICD-10-CM | POA: Diagnosis not present

## 2023-03-03 DIAGNOSIS — N186 End stage renal disease: Secondary | ICD-10-CM | POA: Diagnosis not present

## 2023-03-03 DIAGNOSIS — Z992 Dependence on renal dialysis: Secondary | ICD-10-CM | POA: Diagnosis not present

## 2023-03-03 DIAGNOSIS — N250 Renal osteodystrophy: Secondary | ICD-10-CM | POA: Diagnosis not present

## 2023-03-03 DIAGNOSIS — D631 Anemia in chronic kidney disease: Secondary | ICD-10-CM | POA: Diagnosis not present

## 2023-03-05 DIAGNOSIS — N186 End stage renal disease: Secondary | ICD-10-CM | POA: Diagnosis not present

## 2023-03-05 DIAGNOSIS — D631 Anemia in chronic kidney disease: Secondary | ICD-10-CM | POA: Diagnosis not present

## 2023-03-05 DIAGNOSIS — Z992 Dependence on renal dialysis: Secondary | ICD-10-CM | POA: Diagnosis not present

## 2023-03-05 DIAGNOSIS — N250 Renal osteodystrophy: Secondary | ICD-10-CM | POA: Diagnosis not present

## 2023-03-05 DIAGNOSIS — D509 Iron deficiency anemia, unspecified: Secondary | ICD-10-CM | POA: Diagnosis not present

## 2023-03-07 ENCOUNTER — Inpatient Hospital Stay: Payer: Medicare Other

## 2023-03-07 ENCOUNTER — Inpatient Hospital Stay: Payer: Medicare Other | Attending: Oncology

## 2023-03-07 ENCOUNTER — Telehealth: Payer: Self-pay

## 2023-03-07 ENCOUNTER — Other Ambulatory Visit: Payer: Self-pay

## 2023-03-07 DIAGNOSIS — D500 Iron deficiency anemia secondary to blood loss (chronic): Secondary | ICD-10-CM

## 2023-03-07 DIAGNOSIS — D509 Iron deficiency anemia, unspecified: Secondary | ICD-10-CM | POA: Insufficient documentation

## 2023-03-07 DIAGNOSIS — D631 Anemia in chronic kidney disease: Secondary | ICD-10-CM

## 2023-03-07 LAB — CMP (CANCER CENTER ONLY)
ALT: 25 U/L (ref 0–44)
AST: 19 U/L (ref 15–41)
Albumin: 3.8 g/dL (ref 3.5–5.0)
Alkaline Phosphatase: 116 U/L (ref 38–126)
Anion gap: 12 (ref 5–15)
BUN: 46 mg/dL — ABNORMAL HIGH (ref 8–23)
CO2: 24 mmol/L (ref 22–32)
Calcium: 6.1 mg/dL — CL (ref 8.9–10.3)
Chloride: 101 mmol/L (ref 98–111)
Creatinine: 4.33 mg/dL — ABNORMAL HIGH (ref 0.44–1.00)
GFR, Estimated: 11 mL/min — ABNORMAL LOW (ref >60)
Glucose, Bld: 96 mg/dL (ref 70–99)
Potassium: 4.8 mmol/L (ref 3.5–5.1)
Sodium: 137 mmol/L (ref 135–145)
Total Bilirubin: 0.5 mg/dL (ref 0.0–1.2)
Total Protein: 6.9 g/dL (ref 6.5–8.1)

## 2023-03-07 LAB — COMPREHENSIVE METABOLIC PANEL
ALT: 24 U/L (ref 0–44)
AST: 20 U/L (ref 15–41)
Albumin: 3.5 g/dL (ref 3.5–5.0)
Alkaline Phosphatase: 107 U/L (ref 38–126)
Anion gap: 12 (ref 5–15)
BUN: 45 mg/dL — ABNORMAL HIGH (ref 8–23)
CO2: 22 mmol/L (ref 22–32)
Calcium: 5.9 mg/dL — CL (ref 8.9–10.3)
Chloride: 100 mmol/L (ref 98–111)
Creatinine, Ser: 4.25 mg/dL — ABNORMAL HIGH (ref 0.44–1.00)
GFR, Estimated: 11 mL/min — ABNORMAL LOW (ref >60)
Glucose, Bld: 122 mg/dL — ABNORMAL HIGH (ref 70–99)
Potassium: 4.2 mmol/L (ref 3.5–5.1)
Sodium: 134 mmol/L — ABNORMAL LOW (ref 135–145)
Total Bilirubin: 0.6 mg/dL (ref 0.0–1.2)
Total Protein: 6.5 g/dL (ref 6.5–8.1)

## 2023-03-07 LAB — CBC
HCT: 35.2 % — ABNORMAL LOW (ref 36.0–46.0)
Hemoglobin: 10.6 g/dL — ABNORMAL LOW (ref 12.0–15.0)
MCH: 29.5 pg (ref 26.0–34.0)
MCHC: 30.1 g/dL (ref 30.0–36.0)
MCV: 98.1 fL (ref 80.0–100.0)
Platelets: 426 10*3/uL — ABNORMAL HIGH (ref 150–400)
RBC: 3.59 MIL/uL — ABNORMAL LOW (ref 3.87–5.11)
RDW: 18.6 % — ABNORMAL HIGH (ref 11.5–15.5)
WBC: 7.6 10*3/uL (ref 4.0–10.5)
nRBC: 0 % (ref 0.0–0.2)

## 2023-03-07 LAB — IRON AND TIBC
Iron: 54 ug/dL (ref 28–170)
Saturation Ratios: 19 % (ref 10.4–31.8)
TIBC: 279 ug/dL (ref 250–450)
UIBC: 225 ug/dL

## 2023-03-07 LAB — FERRITIN: Ferritin: 171 ng/mL (ref 11–307)

## 2023-03-07 NOTE — Telephone Encounter (Signed)
Called patient inform her of elevated Ca DR Smith Robert requesting re-draw, denies any symptoms like perioral numbness, cramps etc. Patient on the way back to re-draw

## 2023-03-07 NOTE — Telephone Encounter (Signed)
Left message to hold off on the calcitRIOL per Dr Smith Robert and reach out to nephrology regarding elevated calcium

## 2023-03-08 ENCOUNTER — Encounter: Payer: Self-pay | Admitting: Family Medicine

## 2023-03-08 ENCOUNTER — Ambulatory Visit: Payer: Medicare Other | Admitting: Family Medicine

## 2023-03-08 VITALS — BP 142/60 | Ht 66.0 in | Wt 205.0 lb

## 2023-03-08 DIAGNOSIS — E1169 Type 2 diabetes mellitus with other specified complication: Secondary | ICD-10-CM

## 2023-03-08 DIAGNOSIS — I5030 Unspecified diastolic (congestive) heart failure: Secondary | ICD-10-CM

## 2023-03-08 DIAGNOSIS — Z01818 Encounter for other preprocedural examination: Secondary | ICD-10-CM | POA: Diagnosis not present

## 2023-03-08 DIAGNOSIS — J449 Chronic obstructive pulmonary disease, unspecified: Secondary | ICD-10-CM

## 2023-03-08 DIAGNOSIS — F411 Generalized anxiety disorder: Secondary | ICD-10-CM

## 2023-03-08 DIAGNOSIS — E1143 Type 2 diabetes mellitus with diabetic autonomic (poly)neuropathy: Secondary | ICD-10-CM | POA: Diagnosis not present

## 2023-03-08 DIAGNOSIS — I10 Essential (primary) hypertension: Secondary | ICD-10-CM | POA: Diagnosis not present

## 2023-03-08 DIAGNOSIS — N250 Renal osteodystrophy: Secondary | ICD-10-CM | POA: Diagnosis not present

## 2023-03-08 DIAGNOSIS — D473 Essential (hemorrhagic) thrombocythemia: Secondary | ICD-10-CM | POA: Diagnosis not present

## 2023-03-08 DIAGNOSIS — Z794 Long term (current) use of insulin: Secondary | ICD-10-CM

## 2023-03-08 DIAGNOSIS — F3341 Major depressive disorder, recurrent, in partial remission: Secondary | ICD-10-CM | POA: Diagnosis not present

## 2023-03-08 DIAGNOSIS — N189 Chronic kidney disease, unspecified: Secondary | ICD-10-CM | POA: Insufficient documentation

## 2023-03-08 DIAGNOSIS — N186 End stage renal disease: Secondary | ICD-10-CM | POA: Diagnosis not present

## 2023-03-08 DIAGNOSIS — I7025 Atherosclerosis of native arteries of other extremities with ulceration: Secondary | ICD-10-CM | POA: Diagnosis not present

## 2023-03-08 DIAGNOSIS — E785 Hyperlipidemia, unspecified: Secondary | ICD-10-CM | POA: Diagnosis not present

## 2023-03-08 DIAGNOSIS — D509 Iron deficiency anemia, unspecified: Secondary | ICD-10-CM | POA: Diagnosis not present

## 2023-03-08 DIAGNOSIS — D631 Anemia in chronic kidney disease: Secondary | ICD-10-CM | POA: Diagnosis not present

## 2023-03-08 DIAGNOSIS — Z8673 Personal history of transient ischemic attack (TIA), and cerebral infarction without residual deficits: Secondary | ICD-10-CM

## 2023-03-08 DIAGNOSIS — Z992 Dependence on renal dialysis: Secondary | ICD-10-CM

## 2023-03-08 MED ORDER — STIOLTO RESPIMAT 2.5-2.5 MCG/ACT IN AERS: 2.0000 | INHALATION_SPRAY | Freq: Every day | RESPIRATORY_TRACT | 5 refills | Status: DC

## 2023-03-08 MED ORDER — PAROXETINE HCL 40 MG PO TABS: 40.0000 mg | ORAL_TABLET | Freq: Every day | ORAL | 1 refills | Status: DC

## 2023-03-08 MED ORDER — ATORVASTATIN CALCIUM 40 MG PO TABS: 40.0000 mg | ORAL_TABLET | Freq: Every day | ORAL | 3 refills | Status: DC

## 2023-03-08 NOTE — Assessment & Plan Note (Signed)
On paroxetine for depression. Issues relating to (re/)scheduling, missed appointments and related charges with previous psychiatrist. Needs a new prescription for paroxetine. - Send prescription for paroxetine

## 2023-03-08 NOTE — Assessment & Plan Note (Addendum)
Requires medical clearance for an upcoming vitrectomy. Currently on Eliquis for atrial fibrillation. Advised to stop Eliquis five days before surgery, per [facility] recommendations. Has heart failure. Upcoming cardiology appointment on February 10th for further evaluation.  - Coordinate with cardiology for preoperative clearance - Advise to stop Eliquis five days before surgery  Revised cardiac risk index:  RCRI score 3 -> 15% Risk of major cardiac event - Ensure clearance from cardiology before surgery.

## 2023-03-08 NOTE — Assessment & Plan Note (Signed)
On dialysis three times a week (Tuesday, Thursday, Saturday). Elevated creatinine levels. Advised to take Tums with calcium due to low calcium levels. Started taking Tums at dialysis two weeks ago and will start taking two Tums at home daily. - Continue dialysis as scheduled - Take two Tums with calcium daily at home - Monitor calcium levels regularly

## 2023-03-08 NOTE — Assessment & Plan Note (Signed)
No acute concerns today. Defer to cardiology management.

## 2023-03-08 NOTE — Assessment & Plan Note (Signed)
Diabetes managed by endocrinology; defer to specialist management. - Continue dulaglutide and novolog.

## 2023-03-08 NOTE — Assessment & Plan Note (Addendum)
On atorvastatin for hyperlipidemia. It has been a while since the last cholesterol panel. - Order cholesterol panel - Send prescription for atorvastatin to [facility] on [address]

## 2023-03-08 NOTE — Assessment & Plan Note (Signed)
Monitoring at oncology clinic; level checked yesterday remains mildly elevated. Defer to specialist management.

## 2023-03-08 NOTE — Assessment & Plan Note (Signed)
Stable. Refill paroxetine. - Consider referral to alternative counselor if symptoms worsen.

## 2023-03-08 NOTE — Assessment & Plan Note (Signed)
No acute concerns. Continue atorvastatin. Defer to cardiology management.

## 2023-03-08 NOTE — Assessment & Plan Note (Signed)
Has COPD and uses albuterol as needed. On continuous oxygen therapy. Prescribed Stiolto but does not recall receiving it. - Send prescription for St[facility] on [address] - Encourage smoking cessation  - Monitor respiratory status

## 2023-03-08 NOTE — Assessment & Plan Note (Signed)
Chronic, borderline elevated. However, complains of intermittent dizziness and concern for falls Goal <130/<80 On torsemide 40 mg daily

## 2023-03-08 NOTE — Progress Notes (Signed)
Established patient visit   Patient: Jocelyn Wells   DOB: 15-Apr-1955   68 y.o. Female  MRN: [id number] Visit Date: 03/08/2023  Today's healthcare provider: Sherlyn Hay, DO   Chief Complaint  Patient presents with   Pre-op Exam    Vitrectomy, no date scheduled at this time   Subjective    HPI Medical clearance for vitrectomy under MAC anesthesia [facility] - Dr. Allyne Gee   The patient, with a history of blood clots and heart failure, presents for medical clearance for eye surgery.  The patient is seeking medical clearance for an upcoming vitrectomy, which was initially scheduled last summer but postponed due to the need for medical clearance from her primary care and heart doctors. She has an appointment with her cardiologist on March 21, 2023, to discuss clearance.  She has a history of blood clots in her leg and has been on Eliquis for several years for both the blood clot and a prior stroke. She has previously stopped her blood thinner for procedures like endoscopy and colonoscopy. She mentioned a past incident of a head injury during which she was not on her blood thinner.  She experiences blurry vision even with glasses but has not had episodes of vision going black.  She has a history of heart failure and experiences shortness of breath when walking short distances. She uses a walker due to frequent falls and has COPD, requiring oxygen therapy, which she feels has reduced her need for an albuterol inhaler.  She is on dialysis three times a week and has been taking Tums with calcium due to low calcium levels noted at dialysis. She will be starting taking Tums at home tomorrow after two weeks of supplementation during dialysis sessions.  She has been diagnosed with a JAK5 mutation, high platelet counts and low hemoglobin levels. She sees an oncologist, Dr. Smith Robert. She receives regular blood work at the cancer center. - Positive for JAK-5 [id number] mutation  08/09/2022  Her current medications include paroxetine, atorvastatin, and gabapentin. She uses meclizine rarely and has not been using the Stiolto inhaler, as she only received a sample previously. She occasionally smokes but reports she has reduced her consumption significantly.     Medications: Outpatient Medications Prior to Visit  Medication Sig   albuterol (VENTOLIN HFA) 108 (90 Base) MCG/ACT inhaler TAKE 2 PUFFS BY MOUTH EVERY 6 HOURS AS NEEDED FOR WHEEZE OR SHORTNESS OF BREATH (Patient taking differently: Inhale 2 puffs into the lungs every 6 (six) hours as needed for wheezing or shortness of breath.)   apixaban (ELIQUIS) 5 MG TABS tablet Take 1 tablet (5 mg total) by mouth 2 (two) times daily.   buPROPion (WELLBUTRIN SR) 150 MG 12 hr tablet TAKE 1 TABLET BY MOUTH TWICE A DAY   Dulaglutide (TRULICITY) 4.5 MG/0.5ML SOPN Inject 4.5 mg into the skin every 7 (seven) days. (Patient taking differently: Inject 4.5 mg into the skin every 7 (seven) days. Mondays)   fluticasone (FLONASE) 50 MCG/ACT nasal spray Place 2 sprays into both nostrils daily. (Patient taking differently: Place 2 sprays into both nostrils as needed.)   gabapentin (NEURONTIN) 300 MG capsule Take 300 mg by mouth daily.   hydroxyurea (HYDREA) 500 MG capsule TAKE 1 CAPSULE BY MOUTH EVERY OTHER DAY. MAY TAKE WITH FOOD TO MINIMIZE GI SIDE EFFECTS.   insulin aspart (NOVOLOG) 100 UNIT/ML injection Insulin pump   Insulin Disposable Pump (OMNIPOD DASH PODS, GEN 4,) MISC USE 1 POD EACH EVERY 72  HOURS   Insulin Human (INSULIN PUMP) SOLN Per endocrinoloy   loratadine (CLARITIN) 10 MG tablet Take 1 tablet by mouth daily.   meclizine (ANTIVERT) 12.5 MG tablet Take 1 tablet (12.5 mg total) by mouth 3 (three) times daily as needed for dizziness.   OXYGEN Place 2 L into the nose continuous.   Spacer/Aero-Holding Chambers [facility] DIAMOND) MISC Use with inhaler to ensure medication is delivered throughout lungs   torsemide (DEMADEX)  20 MG tablet TAKE 3 TABLETS BY MOUTH EVERY DAY (Patient taking differently: Take 40 mg by mouth daily. TAKE 2 TABLETS BY MOUTH EVERY DAY)   Vitamin D, Ergocalciferol, (DRISDOL) 1.25 MG (50000 UNIT) CAPS capsule Take 50,000 Units by mouth every 7 (seven) days. Sunday   [DISCONTINUED] atorvastatin (LIPITOR) 40 MG tablet Take 1 tablet (40 mg total) by mouth daily.   [DISCONTINUED] calcitRIOL (ROCALTROL) 0.25 MCG capsule Take 0.25 mcg by mouth daily.   [DISCONTINUED] glucose blood test strip    [DISCONTINUED] PARoxetine (PAXIL) 40 MG tablet Take 1 tablet (40 mg total) by mouth daily.   [DISCONTINUED] Tiotropium Bromide-Olodaterol (STIOLTO RESPIMAT) 2.5-2.5 MCG/ACT AERS Inhale 2 puffs into the lungs daily.   oxyCODONE (OXY IR/ROXICODONE) 5 MG immediate release tablet Take 1 tablet (5 mg total) by mouth every 6 (six) hours as needed for moderate pain (pain score 4-6), severe pain (pain score 7-10) or breakthrough pain. (Patient not taking: Reported on 03/08/2023)   No facility-administered medications prior to visit.    Review of Systems  Constitutional:  Negative for chills and fever.  Eyes:  Positive for visual disturbance.  Respiratory:  Positive for cough (at baseline) and shortness of breath (at baseline).   Cardiovascular:  Negative for chest pain.  Gastrointestinal:  Negative for nausea and vomiting.  Hematological:  Bruises/bleeds easily.        Objective    BP (!) 142/60   Ht 5\' 6"  (1.676 m)   Wt 205 lb (93 kg)   BMI 33.09 kg/m     Physical Exam Constitutional:      Appearance: Normal appearance.  HENT:     Head: Normocephalic and atraumatic.  Eyes:     General: No scleral icterus.    Extraocular Movements: Extraocular movements intact.     Conjunctiva/sclera: Conjunctivae normal.  Cardiovascular:     Rate and Rhythm: Normal rate and regular rhythm.     Pulses: Normal pulses.     Heart sounds: Normal heart sounds.  Pulmonary:     Effort: Pulmonary effort is normal. No  respiratory distress.     Breath sounds: Normal breath sounds. Decreased air movement present.  Musculoskeletal:     Right lower leg: No edema.     Left lower leg: No edema.  Skin:    General: Skin is warm and dry.     Findings: Bruising present.     Comments: Healing horizontal lesion on forehead, s/t recent fall.  Neurological:     Mental Status: She is alert and oriented to person, place, and time. Mental status is at baseline.  Psychiatric:        Mood and Affect: Mood normal.        Behavior: Behavior normal.      No results found for any visits on 03/08/23.  Assessment & Plan    Pre-op evaluation Assessment & Plan: Requires medical clearance for an upcoming vitrectomy. Currently on Eliquis for atrial fibrillation. Advised to stop Eliquis five days before surgery, per [facility] recommendations. Has heart failure.  Upcoming cardiology appointment on February 10th for further evaluation.  - Coordinate with cardiology for preoperative clearance - Advise to stop Eliquis five days before surgery  Revised cardiac risk index:  RCRI score 3 -> 15% Risk of major cardiac event - Ensure clearance from cardiology before surgery.   NYHA class 3 heart failure with preserved ejection fraction, with improvement of ejection fraction from prior measurement [facility]) Assessment & Plan: No acute concerns today. Defer to cardiology management.   Type 2 diabetes mellitus with diabetic autonomic neuropathy, with long-term current use of insulin (HCC) Assessment & Plan: Diabetes managed by endocrinology; defer to specialist management. - Continue dulaglutide and novolog.  Orders: -     Atorvastatin Calcium; Take 1 tablet (40 mg total) by mouth daily.  Dispense: 90 tablet; Refill: 3  ESRD on dialysis [facility]) Assessment & Plan: On dialysis three times a week (Tuesday, Thursday, Saturday). Elevated creatinine levels. Advised to take Tums with calcium due to low calcium levels.  Started taking Tums at dialysis two weeks ago and will start taking two Tums at home daily. - Continue dialysis as scheduled - Take two Tums with calcium daily at home - Monitor calcium levels regularly   Chronic obstructive pulmonary disease, unspecified COPD type (HCC) Assessment & Plan: Has COPD and uses albuterol as needed. On continuous oxygen therapy. Prescribed Stiolto but does not recall receiving it. - Send prescription for Stiolto inhaler to [facility] on [address] - Encourage smoking cessation  - Monitor respiratory status  Orders: -     Stiolto Respimat; Inhale 2 puffs into the lungs daily.  Dispense: 1 each; Refill: 5  Essential hypertension Assessment & Plan: Chronic, borderline elevated. However, complains of intermittent dizziness and concern for falls Goal <130/<80 On torsemide 40 mg daily   Essential thrombocytosis (HCC) Assessment & Plan: Monitoring at oncology clinic; level checked yesterday remains mildly elevated. Defer to specialist management.   Atherosclerosis of native arteries of the extremities with ulceration (HCC) Assessment & Plan: No acute concerns. Continue atorvastatin. Defer to cardiology management.   MDD (major depressive disorder), recurrent, in partial remission (HCC) Assessment & Plan: Stable. Refill paroxetine. - Consider referral to alternative counselor if symptoms worsen.  Orders: -     PARoxetine HCl; Take 1 tablet (40 mg total) by mouth daily.  Dispense: 90 tablet; Refill: 1  Hyperlipidemia associated with type 2 diabetes mellitus (HCC) Assessment & Plan: On atorvastatin for hyperlipidemia. It has been a while since the last cholesterol panel. - Order cholesterol panel - Send prescription for [facility] on [address]  Orders: -     Atorvastatin Calcium; Take 1 tablet (40 mg total) by mouth daily.  Dispense: 90 tablet; Refill: 3 -     Lipid panel  Anxiety, generalized Assessment & Plan: On paroxetine for depression.  Issues relating to (re/)scheduling, missed appointments and related charges with previous psychiatrist. Needs a new prescription for paroxetine. - Send prescription for paroxetine   Hypocalcemia due to chronic kidney disease Assessment & Plan: Monitored regularly at dialysis. Patient to start daily Tums tomorrow. Defer to specialist management.   History of stroke Assessment & Plan: On Eliquis for prior stroke and DVT. Has heart failure. Upcoming cardiology appointment. - Continue Eliquis until five days before surgery (particularly given concomitant ESRD). - Follow up with cardiology on February 10th   General Health Maintenance Received a flu vaccine recently. History of smoking but has reduced smoking significantly. - Encourage complete smoking cessation - Follow up in one month  for further evaluation    Return in about 4 weeks (around 04/05/2023) for Chronic f/u.      I discussed the assessment and treatment plan with the patient  The patient was provided an opportunity to ask questions and all were answered. The patient agreed with the plan and demonstrated an understanding of the instructions.   The patient was advised to call back or seek an in-person evaluation if the symptoms worsen or if the condition fails to improve as anticipated.  Total time was 40 minutes. That includes chart review before the visit, the actual patient visit, and time spent on documentation after the visit.    Sherlyn Hay, DO  [facility] [phone] (phone) [phone] (fax)  [facility]

## 2023-03-08 NOTE — Assessment & Plan Note (Signed)
Monitored regularly at dialysis. Patient to start daily Tums tomorrow. Defer to specialist management.

## 2023-03-08 NOTE — Assessment & Plan Note (Signed)
On Eliquis for prior stroke and DVT. Has heart failure. Upcoming cardiology appointment. - Continue Eliquis until five days before surgery (particularly given concomitant ESRD). - Follow up with cardiology on February 10th

## 2023-03-09 ENCOUNTER — Ambulatory Visit
Admission: RE | Admit: 2023-03-09 | Discharge: 2023-03-09 | Disposition: A | Discharge status: Home / Self Care | Payer: Medicare Other | Source: Ambulatory Visit | Attending: Student in an Organized Health Care Education/Training Program | Admitting: Student in an Organized Health Care Education/Training Program

## 2023-03-09 ENCOUNTER — Ambulatory Visit
Payer: Medicare Other | Attending: Student in an Organized Health Care Education/Training Program | Admitting: Student in an Organized Health Care Education/Training Program

## 2023-03-09 ENCOUNTER — Encounter: Payer: Self-pay | Admitting: Student in an Organized Health Care Education/Training Program

## 2023-03-09 ENCOUNTER — Encounter: Payer: Self-pay | Admitting: Oncology

## 2023-03-09 VITALS — BP 163/95 | HR 78 | Temp 97.5°F | Resp 18 | Ht 66.0 in | Wt 198.0 lb

## 2023-03-09 DIAGNOSIS — G8929 Other chronic pain: Secondary | ICD-10-CM | POA: Diagnosis not present

## 2023-03-09 DIAGNOSIS — M1712 Unilateral primary osteoarthritis, left knee: Secondary | ICD-10-CM | POA: Insufficient documentation

## 2023-03-09 DIAGNOSIS — M25562 Pain in left knee: Secondary | ICD-10-CM | POA: Diagnosis not present

## 2023-03-09 LAB — LIPID PANEL
Chol/HDL Ratio: 2.2 {ratio} (ref 0.0–4.4)
Cholesterol, Total: 141 mg/dL (ref 100–199)
HDL: 65 mg/dL (ref >39)
LDL Chol Calc (NIH): 61 mg/dL (ref 0–99)
Triglycerides: 78 mg/dL (ref 0–149)
VLDL Cholesterol Cal: 15 mg/dL (ref 5–40)

## 2023-03-09 MED ORDER — DIAZEPAM 5 MG PO TABS
5.0000 mg | ORAL_TABLET | ORAL | Status: AC
  Administered 2023-03-09: 5 mg via ORAL

## 2023-03-09 MED ORDER — LIDOCAINE HCL (PF) 2 % IJ SOLN
INTRAMUSCULAR | Status: AC
  Filled 2023-03-09: qty 10

## 2023-03-09 MED ORDER — ROPIVACAINE HCL 2 MG/ML IJ SOLN
9.0000 mL | Freq: Once | INTRAMUSCULAR | Status: AC
  Administered 2023-03-09: 9 mL via PERINEURAL

## 2023-03-09 MED ORDER — DIAZEPAM 5 MG PO TABS
ORAL_TABLET | ORAL | Status: AC
Start: 2023-03-09 — End: ?
  Filled 2023-03-09: qty 1

## 2023-03-09 MED ORDER — LIDOCAINE HCL 2 % IJ SOLN
20.0000 mL | Freq: Once | INTRAMUSCULAR | Status: AC
  Administered 2023-03-09: 100 mg

## 2023-03-09 MED ORDER — DEXAMETHASONE SODIUM PHOSPHATE 10 MG/ML IJ SOLN
INTRAMUSCULAR | Status: AC
  Filled 2023-03-09: qty 1

## 2023-03-09 MED ORDER — ROPIVACAINE HCL 2 MG/ML IJ SOLN
INTRAMUSCULAR | Status: AC
  Filled 2023-03-09: qty 20

## 2023-03-09 MED ORDER — DEXAMETHASONE SODIUM PHOSPHATE 10 MG/ML IJ SOLN
10.0000 mg | Freq: Once | INTRAMUSCULAR | Status: AC
  Administered 2023-03-09: 10 mg

## 2023-03-09 NOTE — Progress Notes (Signed)
PROVIDER NOTE: Interpretation of information contained herein should be left to medically-trained personnel. Specific patient instructions are provided elsewhere under "Patient Instructions" section of medical record. This document was created in part using STT-dictation technology, any transcriptional errors that may result from this process are unintentional.  Patient: Jocelyn Wells Type: Established DOB: July 17, 1955 MRN: [id number] PCP: Sherlyn Hay, DO  Service: Procedure DOS: 03/09/2023 Setting: Ambulatory Location: Ambulatory outpatient facility Delivery: Face-to-face Provider: Edward Jolly, MD Specialty: Interventional Pain Management Specialty designation: 09 Location: Outpatient facility Ref. Prov.: Pardue, Monico Blitz, DO       Interventional Therapy   Procedure:               Type: Genicular Nerves Block (Superolateral, Superomedial, and Inferomedial Genicular Nerves)  #1  Laterality: Left (-LT)  Level: Superior and inferior to the knee joint.  Imaging: Fluoroscopic guidance Anesthesia: Local anesthesia (1-2% Lidocaine) Sedation: Minimal Sedation                       DOS: 03/09/2023  Performed by: Edward Jolly, MD  Purpose: Diagnostic/Therapeutic Indications: Chronic knee pain severe enough to impact quality of life or function. Rationale (medical necessity): procedure needed and proper for the diagnosis and/or treatment of Jocelyn Wells's medical symptoms and needs. 1. Primary osteoarthritis of left knee   2. Chronic pain of left knee    NAS-11 Pain score:   Pre-procedure: 10-Worst pain ever/10   Post-procedure: 3 /10     Target: For Genicular Nerve block(s), the targets are: the superolateral genicular nerve, located in the lateral distal portion of the femoral shaft as it curves to form the lateral epicondyle, in the region of the distal femoral metaphysis; the superomedial genicular nerve, located in the medial distal portion of the femoral shaft as it curves to form  the medial epicondyle; and the inferomedial genicular nerve, located in the medial, proximal portion of the tibial shaft, as it curves to form the medial epicondyle, in the region of the proximal tibial metaphysis.  Location: Superolateral, Superomedial, and Inferomedial aspects of knee joint.  Region: Lateral, Anterior, and Medial aspects of the knee joint, above and below the knee joint proper. Approach: Percutaneous  Type of procedure: Percutaneous perineural nerve block. The genicular nerve block is a motor-sparing technique that anesthetizes the sensory terminal branches innervating the knee joint, resulting in anesthesia of the anterior compartment of the knee. The distribution of anesthesia of each nerve is mostly in the corresponding quadrant.  Neuroanatomy: The superolateral genicular nerve (SLGN) courses around the femur shaft to pass between the vastus lateralis and the lateral epicondyle. It accompanies the superior lateral genicular artery. The superomedial genicular nerve (SMGN) courses around the femur shaft, following the superior medial genicular artery, to pass between the adductor magnus tendon and the medial epicondyle below the vastus medialis. The inferolateral genicular nerve (ILGN) courses around the tibial lateral epicondyle deep to the lateral collateral ligament, following the inferior lateral genicular artery, superior of the fibula head. The inferomedial genicular nerve (IMGN) courses horizontally below the medial collateral ligament between the tibial medial epicondyle and the insertion of the collateral ligament. It accompanies the inferior medial genicular artery. The recurrent peroneal nerve originates in the inferior popliteal region from the common peroneal nerve and courses horizontally around the fibula to pass just inferior of the fibula head and travel superior to the anterolateral tibial epicondyle. It accompanies the recurrent tibial artery.  Position / Prep /  Materials:  Position: Supine,  Modified Fowler's position with pillows under the targeted knee(s). The patient is placed in a supine position with the knee slightly flexed by placing a pillow in the popliteal fossa. Prep solution: ChloraPrep (2% chlorhexidine gluconate and 70% isopropyl alcohol) Prep Area: Entire knee area, from mid-thigh to mid-shin, lateral, anterior, and medial aspects. Materials:  Tray: Block Needle(s):  Type: Spinal  Gauge (G): 22  Length: 3.5-in  Qty: 3  H&P (Pre-op Assessment):  Jocelyn Wells is a 68 y.o. (year old), female patient, seen today for interventional treatment. She  has a past surgical history that includes Abdominal hysterectomy; Tubal ligation (02/09/1976); Cesarean section (02/09/1976); Knee arthroscopy (Left, 02/09/2003); Ulnar nerve transposition (02/08/2006); Bilateral salpingoophorectomy (02/08/1998); Ankle fracture surgery (Left, 02/09/2000); TEE without cardioversion (N/A, 01/31/2015); Cardiac catheterization (N/A, 03/25/2015); Cardiac catheterization (N/A, 08/05/2015); Esophagogastroduodenoscopy (egd) with propofol (N/A, 09/22/2016); Colonoscopy with propofol (N/A, 09/22/2016); Esophagogastroduodenoscopy (egd) with propofol (N/A, 12/08/2016); Lower Extremity Angiography (Left, 08/12/2021); TEMPORARY DIALYSIS CATHETER (N/A, 08/14/2021); Fracture surgery; Colonoscopy with propofol (N/A, 02/15/2022); Esophagogastroduodenoscopy (N/A, 02/15/2022); Givens capsule study (N/A, 04/19/2022); AV fistula placement (Left, 06/18/2022); Insertion hybrid anteriovenous gortex graft (06/18/2022); Esophagogastroduodenoscopy (egd) with propofol (N/A, 10/12/2022); Givens capsule study (N/A, 10/12/2022); Facial laceration repair (N/A, 12/27/2022); PERIPHERAL VASCULAR THROMBECTOMY (Left, 01/19/2023); and DIALYSIS/PERMA CATHETER INSERTION (N/A, 01/19/2023). Jocelyn Wells has a current medication list which includes the following prescription(s): albuterol, apixaban, atorvastatin, bupropion,  trulicity, fluticasone, gabapentin, hydroxyurea, insulin aspart, omnipod dash pods (gen 4), insulin pump, loratadine, meclizine, oxycodone, oxygen-helium, paroxetine, optichamber diamond, stiolto respimat, torsemide, and vitamin d (ergocalciferol). Her primarily concern today is the Knee Pain (left)  Initial Vital Signs:  Pulse/HCG Rate: 78ECG Heart Rate: 80 Temp: (!) 97.5 F (36.4 C) Resp: 18 BP: 124/71 SpO2: 92 %  BMI: Estimated body mass index is 31.96 kg/m as calculated from the following:   Height as of this encounter: 5\' 6"  (1.676 m).   Weight as of this encounter: 198 lb (89.8 kg).  Risk Assessment: Allergies: Reviewed. She is allergic to codeine and ivp dye [iodinated contrast media].  Allergy Precautions: None required Coagulopathies: Reviewed. None identified.  Blood-thinner therapy: None at this time Active Infection(s): Reviewed. None identified. Ms. Depasquale is afebrile  Site Confirmation: Ms. Ellenberger was asked to confirm the procedure and laterality before marking the site Procedure checklist: Completed Consent: Before the procedure and under the influence of no sedative(s), amnesic(s), or anxiolytics, the patient was informed of the treatment options, risks and possible complications. To fulfill our ethical and legal obligations, as recommended by the American Medical Association's Code of Ethics, I have informed the patient of my clinical impression; the nature and purpose of the treatment or procedure; the risks, benefits, and possible complications of the intervention; the alternatives, including doing nothing; the risk(s) and benefit(s) of the alternative treatment(s) or procedure(s); and the risk(s) and benefit(s) of doing nothing. The patient was provided information about the general risks and possible complications associated with the procedure. These may include, but are not limited to: failure to achieve desired goals, infection, bleeding, organ or nerve damage,  allergic reactions, paralysis, and death. In addition, the patient was informed of those risks and complications associated to the procedure, such as failure to decrease pain; infection; bleeding; organ or nerve damage with subsequent damage to sensory, motor, and/or autonomic systems, resulting in permanent pain, numbness, and/or weakness of one or several areas of the body; allergic reactions; (i.e.: anaphylactic reaction); and/or death. Furthermore, the patient was informed of those risks and complications associated with the medications. These include,  but are not limited to: allergic reactions (i.e.: anaphylactic or anaphylactoid reaction(s)); adrenal axis suppression; blood sugar elevation that in diabetics may result in ketoacidosis or comma; water retention that in patients with history of congestive heart failure may result in shortness of breath, pulmonary edema, and decompensation with resultant heart failure; weight gain; swelling or edema; medication-induced neural toxicity; particulate matter embolism and blood vessel occlusion with resultant organ, and/or nervous system infarction; and/or aseptic necrosis of one or more joints. Finally, the patient was informed that Medicine is not an exact science; therefore, there is also the possibility of unforeseen or unpredictable risks and/or possible complications that may result in a catastrophic outcome. The patient indicated having understood very clearly. We have given the patient no guarantees and we have made no promises. Enough time was given to the patient to ask questions, all of which were answered to the patient's satisfaction. Ms. Lauderbaugh has indicated that she wanted to continue with the procedure. Attestation: I, the ordering provider, attest that I have discussed with the patient the benefits, risks, side-effects, alternatives, likelihood of achieving goals, and potential problems during recovery for the procedure that I have provided  informed consent. Date  Time: 03/09/2023 11:38 AM  Pre-Procedure Preparation:  Monitoring: As per clinic protocol. Respiration, ETCO2, SpO2, BP, heart rate and rhythm monitor placed and checked for adequate function Safety Precautions: Patient was assessed for positional comfort and pressure points before starting the procedure. Time-out: I initiated and conducted the "Time-out" before starting the procedure, as per protocol. The patient was asked to participate by confirming the accuracy of the "Time Out" information. Verification of the correct person, site, and procedure were performed and confirmed by me, the nursing staff, and the patient. "Time-out" conducted as per Joint Commission's Universal Protocol (UP.01.01.01). Time: 1156 Start Time: 1156 hrs.  Description/Narrative of Procedure:          Rationale (medical necessity): procedure needed and proper for the diagnosis and/or treatment of the patient's medical symptoms and needs. Procedural Technique Safety Precautions: Aspiration looking for blood return was conducted prior to all injections. At no point did we inject any substances, as a needle was being advanced. No attempts were made at seeking any paresthesias. Safe injection practices and needle disposal techniques used. Medications properly checked for expiration dates. SDV (single dose vial) medications used. Description of the Procedure: Protocol guidelines were followed. The patient was assisted into a comfortable position. The target area was identified and the area prepped in the usual manner. Skin & deeper tissues infiltrated with local anesthetic. Appropriate amount of time allowed to pass for local anesthetics to take effect. The procedure needles were then advanced to the target area. Proper needle placement secured. Negative aspiration confirmed. Solution injected in intermittent fashion, asking for systemic symptoms every 0.5cc of injectate. The needles were then removed and  the area cleansed, making sure to leave some of the prepping solution back to take advantage of its long term bactericidal properties.  3.3 cc of nerve block solution injected for each of the genicular nerves.             Vitals:   03/09/23 1140 03/09/23 1154 03/09/23 1200  BP: 124/71 (!) 146/108 (!) 163/95  Pulse: 78    Resp: 18 18 18   Temp: (!) 97.5 F (36.4 C)    TempSrc: Temporal    SpO2: 92% 96% 95%  Weight: 198 lb (89.8 kg)    Height: 5\' 6"  (1.676 m)  Start Time: 1156 hrs. End Time: 1200 hrs.  Imaging Guidance (Non-Spinal):          Type of Imaging Technique: Fluoroscopy Guidance (Non-Spinal) Indication(s): Fluoroscopy guidance for needle placement to enhance accuracy in procedures requiring precise needle localization for targeted delivery of medication in or near specific anatomical locations not easily accessible without such real-time imaging assistance. Exposure Time: Please see nurses notes. Contrast: None used. Fluoroscopic Guidance: I was personally present during the use of fluoroscopy. "Tunnel Vision Technique" used to obtain the best possible view of the target area. Parallax error corrected before commencing the procedure. "Direction-depth-direction" technique used to introduce the needle under continuous pulsed fluoroscopy. Once target was reached, antero-posterior, oblique, and lateral fluoroscopic projection used confirm needle placement in all planes. Images permanently stored in EMR. Interpretation: No contrast injected. I personally interpreted the imaging intraoperatively. Adequate needle placement confirmed in multiple planes. Permanent images saved into the patient's record.  Post-operative Assessment:  Post-procedure Vital Signs:  Pulse/HCG Rate: 7878 Temp: (!) 97.5 F (36.4 C) Resp: 18 BP: (!) 163/95 SpO2: 95 %  EBL: None  Complications: No immediate post-treatment complications observed by team, or reported by patient.  Note: The  patient tolerated the entire procedure well. A repeat set of vitals were taken after the procedure and the patient was kept under observation following institutional policy, for this type of procedure. Post-procedural neurological assessment was performed, showing return to baseline, prior to discharge. The patient was provided with post-procedure discharge instructions, including a section on how to identify potential problems. Should any problems arise concerning this procedure, the patient was given instructions to immediately contact us, at any time, without hesitation. In any case, we plan to contact the patient by telephone for a follow-up status report regarding this interventional procedure.  Comments:  No additional relevant information.  Plan of Care (POC)  Orders:  Orders Placed This Encounter  Procedures   DG PAIN CL[facility] C-ARM 1-60 MIN NO REPORT    Intraoperative interpretation by procedural physician at [facility].    Standing Status:   Standing    Number of Occurrences:   1    Reason for exam::   Assistance in needle guidance and placement for procedures requiring needle placement in or near specific anatomical locations not easily accessible without such assistance.   Chronic Opioid Analgesic:  Tramadol 50 mg twice daily as needed, quantity 60/month    Medications ordered for procedure: Meds ordered this encounter  Medications   lidocaine (XYLOCAINE) 2 % (with pres) injection 400 mg   diazepam (VALIUM) tablet 5 mg    Make sure Flumazenil is available in the pyxis when using this medication. If oversedation occurs, administer 0.2 mg IV over 15 sec. If after 45 sec no response, administer 0.2 mg again over 1 min; may repeat at 1 min intervals; not to exceed 4 doses (1 mg)   ropivacaine (PF) 2 mg/mL (0.2%) (NAROPIN) injection 9 mL   dexamethasone (DECADRON) injection 10 mg   Medications administered: We administered lidocaine, diazepam, ropivacaine (PF) 2 mg/mL  (0.2%), and dexamethasone.  See the medical record for exact dosing, route, and time of administration.  Follow-up plan:   Return in about 4 weeks (around 04/06/2023) for PPE, VV.       Left genicular nerve block 03/09/23    Recent Visits Date Type Provider Dept  02/21/23 Office Visit Edward Jolly, MD [facility]  Showing recent visits within past 90 days and meeting all other requirements Today's  Visits Date Type Provider Dept  03/09/23 Procedure visit Edward Jolly, MD [facility]  Showing today's visits and meeting all other requirements Future Appointments Date Type Provider Dept  04/13/23 Appointment Edward Jolly, MD [facility]  Showing future appointments within next 90 days and meeting all other requirements  Disposition: Discharge home  Discharge (Date  Time): 03/09/2023; 1210 hrs.   Primary Care Physician: Sherlyn Hay, DO Location: [facility] Note by: Edward Jolly, MD (TTS technology used. I apologize for any typographical errors that were not detected and corrected.) Date: 03/09/2023; Time: 12:52 PM  Disclaimer:  Medicine is not an Visual merchandiser. The only guarantee in medicine is that nothing is guaranteed. It is important to note that the decision to proceed with this intervention was based on the information collected from the patient. The Data and conclusions were drawn from the patient's questionnaire, the interview, and the physical examination. Because the information was provided in large part by the patient, it cannot be guaranteed that it has not been purposely or unconsciously manipulated. Every effort has been made to obtain as much relevant data as possible for this evaluation. It is important to note that the conclusions that lead to this procedure are derived in large part from the available data. Always take into account that the treatment will also be dependent on availability of resources and  existing treatment guidelines, considered by other Pain Management Practitioners as being common knowledge and practice, at the time of the intervention. For Medico-Legal purposes, it is also important to point out that variation in procedural techniques and pharmacological choices are the acceptable norm. The indications, contraindications, technique, and results of the above procedure should only be interpreted and judged by a Board-Certified Interventional Pain Specialist with extensive familiarity and expertise in the same exact procedure and technique.

## 2023-03-09 NOTE — Patient Instructions (Signed)
____________________________________________________________________________________________  Genicular Nerve Block  What is a genicular nerve block? A genicular nerve block is the injection of a local anesthetic to block the nerves that transmits pain from the knee.  What is the purpose of a facet nerve block? A genicular nerve block is a diagnostic procedure to determine if the pathologic changes (i.e. arthritis, meniscal tears, etc) and inflammation within the knee joint is the source of your knee pain. It also confirms that the knee pain will respond well to the actual treatment procedure. If a genicular nerve block works, it will give you relief for several hours. After that, the pain is expected to return to normal. This test is always performed twice (usually a week or two apart) because two successful tests are required to move onto treatment. If both diagnostic tests are positive, then we schedule a treatment called radiofrequency (RF) ablation. In this procedure, the same nerves are cauterized, which typically leads to pain relief for 4 -18 months. If this process works well for one knee, it can be performed on the other knee if needed.  How is the procedure performed? You will be placed on the procedure table. The injection site is sterilized with either iodine or chlorhexadine. The site to be injected is numbed with a local anesthetic, and a needle is directed to the target area. X-ray guidance is used to ensure proper placement and positioning of the needle. When the needle is properly positioned near the genicular nerve, local anesthetic is injected to numb that nerve. This will be repeated at multiple sites around the knee to block all genicular nerves.  Will the procedure be painful? The injection can be painful and we therefore provide the option of receiving IV sedation. IV sedation, combined with local anesthetic, can make the injection nearly pain free. It allows you to remain very  still during the procedure, which can also make the injection easier, faster, and more successful. If you decide to have IV sedation, you must have a driver to get you home safely afterwards. In addition, you cannot have anything to eat or drink within 8 hours of your appointment (clear liquids are allowed until 3 hours before the procedure). If you take medications for diabetes, these medications may need to be adjusted the morning of the procedure. Your primary care physician can help you with this adjustment.  What are the discharge instructions? If you received IV sedation do not drive or operate machinery for at least 24 hours after the procedure. You may return to work the next day following your procedure. You may resume your normal diet immediately. Do not engage in any strenuous activity for 24 hours. You should, however, engage in moderate activity that typically causes your ususal pain. If the block works, those activities should not be painful for several hours after the injection. Do not take a bath, swim, or use a hot tub for 24 hours (you may take a shower). Call the office if you have any of the following: severe pain afterwards (different than your usual symptoms), redness/swelling/discharge at the injection site(s), fevers/chills, difficulty with bowel or bladder functions.  What are the risks and side effects? The complication rate for this procedure is very low. Whenever a needle enters the skin, bleeding or infection can occur. Some other serious but extremely rare risks include paralysis and death. You may have an allergic reaction to any of the medications used. If you have a known allergy to any medications, especially local anesthetics, notify  our staff before the procedure takes place. You may experience any of the following side effects up to 4 - 6 hours after the procedure: . Leg muscle weakness or numbness may occur due to the local anesthetic affecting the nerves that control  your legs (this is a temporary affect and it is not paralysis). If you have any leg weakness or numbness, walk only with assistance in order to prevent falls and injury. Your leg strength will return slowly and completely. . Dizziness may occur due to a decrease in your blood pressure. If this occurs, remain in a seated or lying position. Gradually sit up, and then stand after at least 10 minutes of sitting. . Mild headaches may occur. Drink fluids and take pain medications if needed. If the headaches persist or become severe, call the office. . Mild discomfort at the injection site can occur. This typically lasts for a few hours but can persist for a couple days. If this occurs, take anti-inflammatories or pain medications, apply ice to the area the day of the procedure. If it persists, apply moist heat in the day(s) following.  The side effects listed above can be normal. They are not dangerous and will resolve on their own. If, however, you experience any of the following, a complication may have occurred and you should either contact your doctor. If he is not readily available, then you should proceed to the closest urgent care center for evaluation: . Severe or progressive pain at the injection site(s) . Arm or leg weakness that progressively worsens or persists for longer than 8 hours . Severe or progressive redness, swelling, or discharge from the injections site(s) . Fevers, chills, nausea, or vomiting . Bowel or bladder dysfunction (i.e. inability to urinate or pass stool or difficulty controlling either)  How long does it take for the procedure to work? You should feel relief from your usual pain within the first hour. Again, this is only expected to last for several hours, at the most. Remember, you may be sore in the middle part of your back from the needles, and you must distinguish this from your usual  pain. ____________________________________________________________________________________________   Pain Management Discharge Instructions  General Discharge Instructions :  If you need to reach your doctor call: Monday-Friday 8:00 am - 4:00 pm at [phone] or toll free [phone].  After clinic hours [phone] to have operator reach doctor.  Bring all of your medication bottles to all your appointments in the pain clinic.  To cancel or reschedule your appointment with Pain Management please remember to call 24 hours in advance to avoid a fee.  Refer to the educational materials which you have been given on: General Risks, I had my Procedure. Discharge Instructions, Post Sedation.  Post Procedure Instructions:  The drugs you were given will stay in your system until tomorrow, so for the next 24 hours you should not drive, make any legal decisions or drink any alcoholic beverages.  You may eat anything you prefer, but it is better to start with liquids then soups and crackers, and gradually work up to solid foods.  Please notify your doctor immediately if you have any unusual bleeding, trouble breathing or pain that is not related to your normal pain.  Depending on the type of procedure that was done, some parts of your body may feel week and/or numb.  This usually clears up by tonight or the next day.  Walk with the use of an assistive device or accompanied by an  adult for the 24 hours.  You may use ice on the affected area for the first 24 hours.  Put ice in a Ziploc bag and cover with a towel and place against area 15 minutes on 15 minutes off.  You may switch to heat after 24 hours.

## 2023-03-10 ENCOUNTER — Telehealth: Payer: Self-pay | Admitting: Family Medicine

## 2023-03-10 ENCOUNTER — Other Ambulatory Visit: Payer: Medicare Other

## 2023-03-10 ENCOUNTER — Telehealth: Payer: Self-pay

## 2023-03-10 DIAGNOSIS — F339 Major depressive disorder, recurrent, unspecified: Secondary | ICD-10-CM

## 2023-03-10 DIAGNOSIS — D509 Iron deficiency anemia, unspecified: Secondary | ICD-10-CM | POA: Diagnosis not present

## 2023-03-10 DIAGNOSIS — D631 Anemia in chronic kidney disease: Secondary | ICD-10-CM | POA: Diagnosis not present

## 2023-03-10 DIAGNOSIS — N186 End stage renal disease: Secondary | ICD-10-CM | POA: Diagnosis not present

## 2023-03-10 DIAGNOSIS — N250 Renal osteodystrophy: Secondary | ICD-10-CM | POA: Diagnosis not present

## 2023-03-10 DIAGNOSIS — Z992 Dependence on renal dialysis: Secondary | ICD-10-CM | POA: Diagnosis not present

## 2023-03-10 MED ORDER — BUPROPION HCL ER (SR) 150 MG PO TB12: 150.0000 mg | ORAL_TABLET | Freq: Two times a day (BID) | ORAL | 0 refills | Status: DC

## 2023-03-10 NOTE — Telephone Encounter (Signed)
[facility] is requesting refill buPROPion (WELLBUTRIN SR) 150 MG 12 hr tablet   Please advise

## 2023-03-10 NOTE — Telephone Encounter (Signed)
Last refilled was under Jocelyn Wells. Pharmacy needed an updated one with current PCP

## 2023-03-10 NOTE — Telephone Encounter (Signed)
Post procedure follow up.  LM

## 2023-03-11 ENCOUNTER — Other Ambulatory Visit: Payer: Self-pay | Admitting: Pharmacist

## 2023-03-11 ENCOUNTER — Other Ambulatory Visit: Payer: Self-pay | Admitting: Family Medicine

## 2023-03-11 DIAGNOSIS — N186 End stage renal disease: Secondary | ICD-10-CM | POA: Diagnosis not present

## 2023-03-11 DIAGNOSIS — Z992 Dependence on renal dialysis: Secondary | ICD-10-CM | POA: Diagnosis not present

## 2023-03-11 DIAGNOSIS — J449 Chronic obstructive pulmonary disease, unspecified: Secondary | ICD-10-CM

## 2023-03-11 NOTE — Progress Notes (Signed)
   03/11/2023  Patient ID: [name], female   DOB: 06-30-1955, 68 y.o.   MRN: [id number]  Called and spoke with the patient on the phone regarding medications.She reports stopping the calcitriol as instructed and taking Tums daily instead. Reports getting Stiolto inhaler. For Trulicity, she reports having issues affording it at the pharmacy, so she hasn't received it in a while- her husband was supposed to manage this.    Tried to call [employer] regarding cost, but gave up after 30 minutes on-hold. Will try again next week.  Reports her BG was 160 prior to eating earlier today. Confirmed taking Novolog as prescribed. Also reports no longer using CGM as dialysis apparently told her she was not allowed to use them anymore. Has been getting test strips in the mail to use with her meter now.  Update:  - Called [facility] about Trulicity, but requires a PA - Submitted PA online and says approved - Will call pharmacy later to re-process claim    Marlowe Aschoff, PharmD [facility] Phone Number: [phone]

## 2023-03-12 DIAGNOSIS — Z992 Dependence on renal dialysis: Secondary | ICD-10-CM | POA: Diagnosis not present

## 2023-03-12 DIAGNOSIS — D631 Anemia in chronic kidney disease: Secondary | ICD-10-CM | POA: Diagnosis not present

## 2023-03-12 DIAGNOSIS — N186 End stage renal disease: Secondary | ICD-10-CM | POA: Diagnosis not present

## 2023-03-12 DIAGNOSIS — N2581 Secondary hyperparathyroidism of renal origin: Secondary | ICD-10-CM | POA: Diagnosis not present

## 2023-03-12 DIAGNOSIS — D509 Iron deficiency anemia, unspecified: Secondary | ICD-10-CM | POA: Diagnosis not present

## 2023-03-12 DIAGNOSIS — N250 Renal osteodystrophy: Secondary | ICD-10-CM | POA: Diagnosis not present

## 2023-03-14 NOTE — Telephone Encounter (Signed)
Requested medications are due for refill today.  no  Requested medications are on the active medications list.  yes  Last refill. See note  Future visit scheduled.   yes  Notes to clinic.  Pharmacy comment: Alternative Requested:PATIENT REQUESTS ALTERNATIVE. THIS ONE IS $35.    Requested Prescriptions  Pending Prescriptions Disp Refills   STIOLTO RESPIMAT 2.5-2.5 MCG/ACT AERS [Pharmacy Med Name: STIOLTO RESPIMAT INHALER (60)]  5    Sig: INHALE 2 PUFFS BY MOUTH INTO THE LUNGS DAILY     Off-Protocol Failed - 03/14/2023  8:37 AM      Failed - Medication not assigned to a protocol, review manually.      Passed - Valid encounter within last 12 months    Recent Outpatient Visits           6 days ago Pre-op evaluation   [facility] [place], Monico Blitz, DO   9 months ago Encounter for subsequent annual wellness visit (AWV) in Medicare patient   [facility] Merita Norton T, FNP   1 year ago Depression, recurrent [facility])   [facility] Jacky Kindle, FNP   1 year ago Pain of right lower extremity   [facility] Caro Laroche, DO   1 year ago Screening for colon cancer   [facility] Jacky Kindle, FNP       Future Appointments             In 1 week Charlsie Quest, NP [facility] [place]   In 3 weeks [facility], Monico Blitz, DO [facility]

## 2023-03-15 DIAGNOSIS — D631 Anemia in chronic kidney disease: Secondary | ICD-10-CM | POA: Diagnosis not present

## 2023-03-15 DIAGNOSIS — N250 Renal osteodystrophy: Secondary | ICD-10-CM | POA: Diagnosis not present

## 2023-03-15 DIAGNOSIS — N2581 Secondary hyperparathyroidism of renal origin: Secondary | ICD-10-CM | POA: Diagnosis not present

## 2023-03-15 DIAGNOSIS — D509 Iron deficiency anemia, unspecified: Secondary | ICD-10-CM | POA: Diagnosis not present

## 2023-03-15 DIAGNOSIS — N186 End stage renal disease: Secondary | ICD-10-CM | POA: Diagnosis not present

## 2023-03-15 DIAGNOSIS — Z992 Dependence on renal dialysis: Secondary | ICD-10-CM | POA: Diagnosis not present

## 2023-03-17 DIAGNOSIS — N2581 Secondary hyperparathyroidism of renal origin: Secondary | ICD-10-CM | POA: Diagnosis not present

## 2023-03-17 DIAGNOSIS — Z992 Dependence on renal dialysis: Secondary | ICD-10-CM | POA: Diagnosis not present

## 2023-03-17 DIAGNOSIS — N186 End stage renal disease: Secondary | ICD-10-CM | POA: Diagnosis not present

## 2023-03-17 DIAGNOSIS — D631 Anemia in chronic kidney disease: Secondary | ICD-10-CM | POA: Diagnosis not present

## 2023-03-17 DIAGNOSIS — N250 Renal osteodystrophy: Secondary | ICD-10-CM | POA: Diagnosis not present

## 2023-03-17 DIAGNOSIS — D509 Iron deficiency anemia, unspecified: Secondary | ICD-10-CM | POA: Diagnosis not present

## 2023-03-19 DIAGNOSIS — Z992 Dependence on renal dialysis: Secondary | ICD-10-CM | POA: Diagnosis not present

## 2023-03-19 DIAGNOSIS — N250 Renal osteodystrophy: Secondary | ICD-10-CM | POA: Diagnosis not present

## 2023-03-19 DIAGNOSIS — N2581 Secondary hyperparathyroidism of renal origin: Secondary | ICD-10-CM | POA: Diagnosis not present

## 2023-03-19 DIAGNOSIS — D631 Anemia in chronic kidney disease: Secondary | ICD-10-CM | POA: Diagnosis not present

## 2023-03-19 DIAGNOSIS — D509 Iron deficiency anemia, unspecified: Secondary | ICD-10-CM | POA: Diagnosis not present

## 2023-03-19 DIAGNOSIS — N186 End stage renal disease: Secondary | ICD-10-CM | POA: Diagnosis not present

## 2023-03-21 ENCOUNTER — Ambulatory Visit: Payer: Medicare Other | Attending: Cardiology | Admitting: Cardiology

## 2023-03-21 ENCOUNTER — Encounter: Payer: Self-pay | Admitting: Cardiology

## 2023-03-21 ENCOUNTER — Encounter: Payer: Self-pay | Admitting: Family Medicine

## 2023-03-21 VITALS — BP 140/70 | HR 77 | Ht 66.0 in | Wt 206.0 lb

## 2023-03-21 DIAGNOSIS — E1169 Type 2 diabetes mellitus with other specified complication: Secondary | ICD-10-CM | POA: Diagnosis not present

## 2023-03-21 DIAGNOSIS — N186 End stage renal disease: Secondary | ICD-10-CM | POA: Diagnosis not present

## 2023-03-21 DIAGNOSIS — I739 Peripheral vascular disease, unspecified: Secondary | ICD-10-CM | POA: Diagnosis not present

## 2023-03-21 DIAGNOSIS — E785 Hyperlipidemia, unspecified: Secondary | ICD-10-CM | POA: Insufficient documentation

## 2023-03-21 DIAGNOSIS — Z992 Dependence on renal dialysis: Secondary | ICD-10-CM | POA: Insufficient documentation

## 2023-03-21 DIAGNOSIS — I10 Essential (primary) hypertension: Secondary | ICD-10-CM | POA: Diagnosis not present

## 2023-03-21 DIAGNOSIS — Z01810 Encounter for preprocedural cardiovascular examination: Secondary | ICD-10-CM | POA: Diagnosis not present

## 2023-03-21 DIAGNOSIS — J449 Chronic obstructive pulmonary disease, unspecified: Secondary | ICD-10-CM | POA: Insufficient documentation

## 2023-03-21 DIAGNOSIS — I428 Other cardiomyopathies: Secondary | ICD-10-CM | POA: Diagnosis not present

## 2023-03-21 DIAGNOSIS — D631 Anemia in chronic kidney disease: Secondary | ICD-10-CM | POA: Diagnosis not present

## 2023-03-21 DIAGNOSIS — I82511 Chronic embolism and thrombosis of right femoral vein: Secondary | ICD-10-CM | POA: Insufficient documentation

## 2023-03-21 DIAGNOSIS — I5032 Chronic diastolic (congestive) heart failure: Secondary | ICD-10-CM | POA: Diagnosis not present

## 2023-03-21 NOTE — Progress Notes (Signed)
 Cardiology Office Note:  .   Date:  03/21/2023  ID:  Jocelyn Wells, DOB 14-Jul-1955, MRN [id number] PCP: Carlean Charter, DO  [facility] Providers Cardiologist:  Constancia Delton, MD    History of Present Illness: Jocelyn Wells   Jocelyn Wells is a 68 y.o. female with past medical history of anxiety, HFpEF, nonischemic cardiomyopathy, peripheral arterial disease, hypertension, diabetes, former smoker, end-stage renal disease on hemodialysis, COPD, CVA, chronic DVT on OAC, CKD, lumbar spinal stenosis, severe back pain, who is here today for follow-up for HFpEF, nonischemic cardiomyopathy.   Diagnosed with systolic congestive heart failure twice a day with an EF of 45% that normalized to 60-65% 2019.  CVA due to thrombosis of the left middle cerebral artery status post ILR placement in 2017.  Chronic anticoagulant due to DVT.  Echocardiogram completed 09/03/2022 revealed an LVEF of 50-55%, G1 DD, without valvular abnormalities noted.     In 08/2021 stent placement to the left superficial femoral artery, popliteal, percutaneous transluminal angioplasty of left common iliac artery, percutaneous angioplasty of the left peroneal artery and mechanical thrombectomy of the left tibioperoneal trunk.  Hospital course was complicated by acute renal failure requiring temporary dialysis.  She subsequently returned to her baseline stage IV CKD.  Was admitted to [facility] 01/01/2022 with shortness of breath.  Hemoglobin is noted to be 6.7.  She was transfused and diuresed with furosemide .  She did follow with nephrology for AKI.  She was able to be discharged on 01/04/2022.  Outpatient colonoscopy and EGD in January 2024 continue to be on iron  infusions.  She follows with vascular for evaluation of left brachiocephalic fistula placement.  AV fistula was placed on 06/18/2022.  She tolerated procedure well without any immediate complications.   She was last seen in clinic 11/17/2022.  At that time she is doing fairly well.  She  was having increased amount of fatigue she related to her hemodialysis.  Hemodialysis was on Tuesday, Thursday, Saturday.  She had recently had extensive GI workup plan continue to follow hematology for her iron  deficiency anemia.  There were no medication changes that were made and no further testing that was ordered at that time.  She returns to clinic today accompanied by family member.  She states that she has been doing well.  Denies any chest pain, shortness of breath, peripheral edema.  States that her fluid has been maintained by dialysis.  She states that she did miss 1 dialysis run earlier last week because she felt as though she had the flu and had been sick.  She states that she has not missed any of her medication primarily her apixaban .  She denies any bleeding with no noted bloody urine noted in her stool.  Previously she had had issues with her AV fistula and had to have some revision work completed she states that now it is working appropriately.  She continues to sleep with O2 at 2 L at night but states there has been no other medication changes and she has had no other hospitalizations.  ROS: 10 point review of systems has been reviewed and considered negative with exception was been listed in the HPI  Studies Reviewed: Jocelyn Wells   EKG Interpretation Date/Time:  Monday March 21 2023 11:44:31 EST Ventricular Rate:  77 PR Interval:  190 QRS Duration:  154 QT Interval:  446 QTC Calculation: 504 R Axis:   -74  Text Interpretation: Normal sinus rhythm Right bundle branch block Left anterior fascicular block Bifascicular  block Minimal voltage criteria for LVH, may be normal variant ( R in aVL ) When compared with ECG of 17-Jan-2023 10:51, Sinus rhythm has replaced Wide QRS rhythm Confirmed by Ronald Cockayne ([id number]) on 03/21/2023 11:48:50 AM    TTE 09/03/22 1. Left ventricular ejection fraction, by estimation, is 50 to 55%. The  left ventricle has low normal function. Unable to exclude  regional wall  motion abnormailty in the setting of bundle branch block, possible mild  inferior wall hypokinesis (difficult  to visualize, definity not used). There is moderate left ventricular  hypertrophy. Left ventricular diastolic parameters are consistent with  Grade I diastolic dysfunction (impaired relaxation).   2. Right ventricular systolic function is normal. The right ventricular  size is normal. Mildly increased right ventricular wall thickness. There  is mildly elevated pulmonary artery systolic pressure. The estimated right  ventricular systolic pressure is  38.2 mmHg.   3. Left atrial size was moderately dilated.   4. The mitral valve is normal in structure. No evidence of mitral valve  regurgitation. No evidence of mitral stenosis.   5. The aortic valve has an indeterminant number of cusps. Aortic valve  regurgitation is not visualized. Aortic valve sclerosis is present, with  no evidence of aortic valve stenosis.   6. The inferior vena cava is normal in size with greater than 50%  respiratory variability, suggesting right atrial pressure of 3 mmHg.  Risk Assessment/Calculations:          Physical Exam:   VS:  BP (!) 140/70 (BP Location: Left Arm, Patient Position: Sitting, Cuff Size: Normal)   Pulse 77   Ht 5\' 6"  (1.676 m)   Wt 206 lb (93.4 kg)   SpO2 96%   BMI 33.25 kg/m    Wt Readings from Last 3 Encounters:  03/21/23 206 lb (93.4 kg)  03/09/23 198 lb (89.8 kg)  03/08/23 205 lb (93 kg)    GEN: Well nourished, well developed in no acute distress NECK: No JVD; No carotid bruits CARDIAC: RRR, no murmurs, rubs, gallops RESPIRATORY:  Clear to auscultation without rales, wheezing or rhonchi  ABDOMEN: Soft, non-tender, non-distended EXTREMITIES: Trace pretibial edema; No deformity   ASSESSMENT AND PLAN: .   HFimpEF/nonischemic cardiomyopathy with last LVEF 50-55% on echocardiogram completed 09/03/2022.  She is currently not on ACE/ARB/Arni/MRA/SGLT2 inhibitor  due to end-stage renal disease.  Did not tolerate beta-blocker therapy due to hypotension.  She is continued on torsemide  60 mg daily.  No changes were made to her current medications today as we are limited on escalation of GDMT due to prior hypotension along with end-stage renal disease.  EKG today reveals sinus rhythm with a rate of 77 with bifascicular block, right bundle branch block left anterior fascicular block and LVH.  Peripheral artery disease where she underwent stent placement to the left.  Hospital stay was complicated by AKI requiring dialysis during hospitalization.  AV fistula was recently placed by [facility] for end-stage renal disease with revision.  She is continued on apixaban  and lieu of aspirin  and atorvastatin  40 mg daily.  Hypertension with hypotension on occasion without orthostasis the blood pressure today 140/70.  She is continued on torsemide  60 mg daily.  She is encouraged to continue to monitor his blood pressures at home 1 to 2 hours postmedication.  Mixed hyperlipidemia with associated type 2 diabetes with a LDL 61 on atorvastatin  40 mg daily and ongoing management of her diabetes that continues to be followed by her PCP.  Chronic DVT to  the right femoral vein in the right lower extremity that has been maintained on chronic anticoagulation of apixaban  5 mg twice daily.  End-stage renal disease with hemodialysis on Tuesday Thursday Saturday.  This continues to be managed by nephrology.  Fluid removal is managed with dialysis.  Anemia of chronic kidney disease.  She has received multiple blood transfusions and continued with outpatient transfusions and iron  infusions.  She continues to be followed by hematology.  COPD which is longstanding she continues for 2 L of O2 at night.  She continues to be followed by pulmonary.  Bifascicular block with right bundle branch block and left anterior fascicular block noted on EKG today.  Will continue to follow with surveillance  studies.  Preoperative cardiovascular examination patient had previously been cleared for her surgery.  She has also been advised by her PCP to hold her apixaban  for 5 days but she had follow-up with cardiology that was forthcoming.  She has been advised to hold her apixaban  prior to her procedure as instructed by her PCP and restarted as soon as considered stable after her procedure has been completed.  There was no further testing that she needs prior to her procedure.  She can proceed with acceptable risk.       Dispo: Patient to return to clinic to see MD/APP in 6 months or sooner if needed  Signed, Wilma Michaelson, NP

## 2023-03-21 NOTE — Patient Instructions (Addendum)
 Medication Instructions:  No changes *If you need a refill on your cardiac medications before your next appointment, please call your pharmacy*   Lab Work: None ordered If you have labs (blood work) drawn today and your tests are completely normal, you will receive your results only by: MyChart Message (if you have MyChart) OR A paper copy in the mail If you have any lab test that is abnormal or we need to change your treatment, we will call you to review the results.   Testing/Procedures: None ordered   Follow-Up: At [facility], you and your health needs are our priority.  As part of our continuing mission to provide you with exceptional heart care, we have created designated Provider Care Teams.  These Care Teams include your primary Cardiologist (physician) and Advanced Practice Providers (APPs -  Physician Assistants and Nurse Practitioners) who all work together to provide you with the care you need, when you need it.  We recommend signing up for the patient portal called "MyChart".  Sign up information is provided on this After Visit Summary.  MyChart is used to connect with patients for Virtual Visits (Telemedicine).  Patients are able to view lab/test results, encounter notes, upcoming appointments, etc.  Non-urgent messages can be sent to your provider as well.   To learn more about what you can do with MyChart, go to [email or web address].    Your next appointment:   6 month(s)  Provider:   You may see Dr. Junnie Olives or one of the following Advanced Practice Providers on your designated Care Team:   Ronald Cockayne, NP

## 2023-03-22 DIAGNOSIS — N186 End stage renal disease: Secondary | ICD-10-CM | POA: Diagnosis not present

## 2023-03-22 DIAGNOSIS — N2581 Secondary hyperparathyroidism of renal origin: Secondary | ICD-10-CM | POA: Diagnosis not present

## 2023-03-22 DIAGNOSIS — Z992 Dependence on renal dialysis: Secondary | ICD-10-CM | POA: Diagnosis not present

## 2023-03-22 DIAGNOSIS — D509 Iron deficiency anemia, unspecified: Secondary | ICD-10-CM | POA: Diagnosis not present

## 2023-03-22 DIAGNOSIS — N250 Renal osteodystrophy: Secondary | ICD-10-CM | POA: Diagnosis not present

## 2023-03-22 DIAGNOSIS — D631 Anemia in chronic kidney disease: Secondary | ICD-10-CM | POA: Diagnosis not present

## 2023-03-24 DIAGNOSIS — N186 End stage renal disease: Secondary | ICD-10-CM | POA: Diagnosis not present

## 2023-03-24 DIAGNOSIS — N250 Renal osteodystrophy: Secondary | ICD-10-CM | POA: Diagnosis not present

## 2023-03-24 DIAGNOSIS — D631 Anemia in chronic kidney disease: Secondary | ICD-10-CM | POA: Diagnosis not present

## 2023-03-24 DIAGNOSIS — N2581 Secondary hyperparathyroidism of renal origin: Secondary | ICD-10-CM | POA: Diagnosis not present

## 2023-03-24 DIAGNOSIS — D509 Iron deficiency anemia, unspecified: Secondary | ICD-10-CM | POA: Diagnosis not present

## 2023-03-24 DIAGNOSIS — Z992 Dependence on renal dialysis: Secondary | ICD-10-CM | POA: Diagnosis not present

## 2023-03-26 DIAGNOSIS — N250 Renal osteodystrophy: Secondary | ICD-10-CM | POA: Diagnosis not present

## 2023-03-26 DIAGNOSIS — N186 End stage renal disease: Secondary | ICD-10-CM | POA: Diagnosis not present

## 2023-03-26 DIAGNOSIS — Z992 Dependence on renal dialysis: Secondary | ICD-10-CM | POA: Diagnosis not present

## 2023-03-26 DIAGNOSIS — D631 Anemia in chronic kidney disease: Secondary | ICD-10-CM | POA: Diagnosis not present

## 2023-03-26 DIAGNOSIS — N2581 Secondary hyperparathyroidism of renal origin: Secondary | ICD-10-CM | POA: Diagnosis not present

## 2023-03-26 DIAGNOSIS — D509 Iron deficiency anemia, unspecified: Secondary | ICD-10-CM | POA: Diagnosis not present

## 2023-03-28 ENCOUNTER — Other Ambulatory Visit: Payer: Self-pay | Admitting: Oncology

## 2023-03-29 DIAGNOSIS — N186 End stage renal disease: Secondary | ICD-10-CM | POA: Diagnosis not present

## 2023-03-29 DIAGNOSIS — D631 Anemia in chronic kidney disease: Secondary | ICD-10-CM | POA: Diagnosis not present

## 2023-03-29 DIAGNOSIS — N2581 Secondary hyperparathyroidism of renal origin: Secondary | ICD-10-CM | POA: Diagnosis not present

## 2023-03-29 DIAGNOSIS — Z992 Dependence on renal dialysis: Secondary | ICD-10-CM | POA: Diagnosis not present

## 2023-03-29 DIAGNOSIS — D509 Iron deficiency anemia, unspecified: Secondary | ICD-10-CM | POA: Diagnosis not present

## 2023-03-29 DIAGNOSIS — N250 Renal osteodystrophy: Secondary | ICD-10-CM | POA: Diagnosis not present

## 2023-04-02 DIAGNOSIS — Z992 Dependence on renal dialysis: Secondary | ICD-10-CM | POA: Diagnosis not present

## 2023-04-02 DIAGNOSIS — N2581 Secondary hyperparathyroidism of renal origin: Secondary | ICD-10-CM | POA: Diagnosis not present

## 2023-04-02 DIAGNOSIS — N250 Renal osteodystrophy: Secondary | ICD-10-CM | POA: Diagnosis not present

## 2023-04-02 DIAGNOSIS — N186 End stage renal disease: Secondary | ICD-10-CM | POA: Diagnosis not present

## 2023-04-02 DIAGNOSIS — D631 Anemia in chronic kidney disease: Secondary | ICD-10-CM | POA: Diagnosis not present

## 2023-04-02 DIAGNOSIS — D509 Iron deficiency anemia, unspecified: Secondary | ICD-10-CM | POA: Diagnosis not present

## 2023-04-05 DIAGNOSIS — Z992 Dependence on renal dialysis: Secondary | ICD-10-CM | POA: Diagnosis not present

## 2023-04-05 DIAGNOSIS — N250 Renal osteodystrophy: Secondary | ICD-10-CM | POA: Diagnosis not present

## 2023-04-05 DIAGNOSIS — N186 End stage renal disease: Secondary | ICD-10-CM | POA: Diagnosis not present

## 2023-04-05 DIAGNOSIS — D509 Iron deficiency anemia, unspecified: Secondary | ICD-10-CM | POA: Diagnosis not present

## 2023-04-05 DIAGNOSIS — N2581 Secondary hyperparathyroidism of renal origin: Secondary | ICD-10-CM | POA: Diagnosis not present

## 2023-04-05 DIAGNOSIS — D631 Anemia in chronic kidney disease: Secondary | ICD-10-CM | POA: Diagnosis not present

## 2023-04-06 ENCOUNTER — Ambulatory Visit: Payer: Self-pay | Admitting: Family Medicine

## 2023-04-08 DIAGNOSIS — N186 End stage renal disease: Secondary | ICD-10-CM | POA: Diagnosis not present

## 2023-04-08 DIAGNOSIS — Z992 Dependence on renal dialysis: Secondary | ICD-10-CM | POA: Diagnosis not present

## 2023-04-09 DIAGNOSIS — D631 Anemia in chronic kidney disease: Secondary | ICD-10-CM | POA: Diagnosis not present

## 2023-04-09 DIAGNOSIS — N186 End stage renal disease: Secondary | ICD-10-CM | POA: Diagnosis not present

## 2023-04-09 DIAGNOSIS — N2581 Secondary hyperparathyroidism of renal origin: Secondary | ICD-10-CM | POA: Diagnosis not present

## 2023-04-09 DIAGNOSIS — N250 Renal osteodystrophy: Secondary | ICD-10-CM | POA: Diagnosis not present

## 2023-04-09 DIAGNOSIS — Z992 Dependence on renal dialysis: Secondary | ICD-10-CM | POA: Diagnosis not present

## 2023-04-09 DIAGNOSIS — D509 Iron deficiency anemia, unspecified: Secondary | ICD-10-CM | POA: Diagnosis not present

## 2023-04-12 ENCOUNTER — Encounter: Payer: Self-pay | Admitting: Student in an Organized Health Care Education/Training Program

## 2023-04-12 ENCOUNTER — Telehealth: Payer: Self-pay | Admitting: *Deleted

## 2023-04-12 DIAGNOSIS — D631 Anemia in chronic kidney disease: Secondary | ICD-10-CM | POA: Diagnosis not present

## 2023-04-12 DIAGNOSIS — N186 End stage renal disease: Secondary | ICD-10-CM | POA: Diagnosis not present

## 2023-04-12 DIAGNOSIS — D509 Iron deficiency anemia, unspecified: Secondary | ICD-10-CM | POA: Diagnosis not present

## 2023-04-12 DIAGNOSIS — Z992 Dependence on renal dialysis: Secondary | ICD-10-CM | POA: Diagnosis not present

## 2023-04-12 DIAGNOSIS — N2581 Secondary hyperparathyroidism of renal origin: Secondary | ICD-10-CM | POA: Diagnosis not present

## 2023-04-12 DIAGNOSIS — N250 Renal osteodystrophy: Secondary | ICD-10-CM | POA: Diagnosis not present

## 2023-04-13 ENCOUNTER — Ambulatory Visit
Payer: Medicare Other | Attending: Student in an Organized Health Care Education/Training Program | Admitting: Student in an Organized Health Care Education/Training Program

## 2023-04-13 DIAGNOSIS — M1712 Unilateral primary osteoarthritis, left knee: Secondary | ICD-10-CM

## 2023-04-13 NOTE — Progress Notes (Signed)
.  I attempted to call the patient however no response. Voicemail left instructing patient to call front desk office at [phone] to reschedule appointment. -Dr Cherylann Ratel    HPI  Today, she is being contacted for a post-procedure assessment.   Post-procedure evaluation    Type: Genicular Nerves Block (Superolateral, Superomedial, and Inferomedial Genicular Nerves)  #1  Laterality: Left (-LT)  Level: Superior and inferior to the knee joint.  Imaging: Fluoroscopic guidance Anesthesia: Local anesthesia (1-2% Lidocaine) Sedation: Minimal Sedation                       DOS: 03/09/2023  Performed by: Edward Jolly, MD  Purpose: Diagnostic/Therapeutic Indications: Chronic knee pain severe enough to impact quality of life or function. Rationale (medical necessity): procedure needed and proper for the diagnosis and/or treatment of Ms. Frye's medical symptoms and needs. 1. Primary osteoarthritis of left knee   2. Chronic pain of left knee    NAS-11 Pain score:   Pre-procedure: 10-Worst pain ever/10   Post-procedure: 3 /10     Effectiveness:  Initial hour after procedure: 70 %  Subsequent 4-6 hours post-procedure: 80 %  Analgesia past initial 6 hours: 100 %  Ongoing improvement:  Analgesic:

## 2023-04-14 DIAGNOSIS — N2581 Secondary hyperparathyroidism of renal origin: Secondary | ICD-10-CM | POA: Diagnosis not present

## 2023-04-14 DIAGNOSIS — N186 End stage renal disease: Secondary | ICD-10-CM | POA: Diagnosis not present

## 2023-04-14 DIAGNOSIS — Z992 Dependence on renal dialysis: Secondary | ICD-10-CM | POA: Diagnosis not present

## 2023-04-14 DIAGNOSIS — N250 Renal osteodystrophy: Secondary | ICD-10-CM | POA: Diagnosis not present

## 2023-04-14 DIAGNOSIS — D509 Iron deficiency anemia, unspecified: Secondary | ICD-10-CM | POA: Diagnosis not present

## 2023-04-14 DIAGNOSIS — D631 Anemia in chronic kidney disease: Secondary | ICD-10-CM | POA: Diagnosis not present

## 2023-04-16 DIAGNOSIS — D509 Iron deficiency anemia, unspecified: Secondary | ICD-10-CM | POA: Diagnosis not present

## 2023-04-16 DIAGNOSIS — Z992 Dependence on renal dialysis: Secondary | ICD-10-CM | POA: Diagnosis not present

## 2023-04-16 DIAGNOSIS — D631 Anemia in chronic kidney disease: Secondary | ICD-10-CM | POA: Diagnosis not present

## 2023-04-16 DIAGNOSIS — N250 Renal osteodystrophy: Secondary | ICD-10-CM | POA: Diagnosis not present

## 2023-04-16 DIAGNOSIS — N186 End stage renal disease: Secondary | ICD-10-CM | POA: Diagnosis not present

## 2023-04-16 DIAGNOSIS — N2581 Secondary hyperparathyroidism of renal origin: Secondary | ICD-10-CM | POA: Diagnosis not present

## 2023-04-19 DIAGNOSIS — M952 Other acquired deformity of head: Secondary | ICD-10-CM | POA: Diagnosis not present

## 2023-04-19 DIAGNOSIS — D509 Iron deficiency anemia, unspecified: Secondary | ICD-10-CM | POA: Diagnosis not present

## 2023-04-19 DIAGNOSIS — S0181XD Laceration without foreign body of other part of head, subsequent encounter: Secondary | ICD-10-CM | POA: Diagnosis not present

## 2023-04-19 DIAGNOSIS — N250 Renal osteodystrophy: Secondary | ICD-10-CM | POA: Diagnosis not present

## 2023-04-19 DIAGNOSIS — D631 Anemia in chronic kidney disease: Secondary | ICD-10-CM | POA: Diagnosis not present

## 2023-04-19 DIAGNOSIS — N2581 Secondary hyperparathyroidism of renal origin: Secondary | ICD-10-CM | POA: Diagnosis not present

## 2023-04-19 DIAGNOSIS — Z992 Dependence on renal dialysis: Secondary | ICD-10-CM | POA: Diagnosis not present

## 2023-04-19 DIAGNOSIS — N186 End stage renal disease: Secondary | ICD-10-CM | POA: Diagnosis not present

## 2023-04-21 DIAGNOSIS — D631 Anemia in chronic kidney disease: Secondary | ICD-10-CM | POA: Diagnosis not present

## 2023-04-21 DIAGNOSIS — N2581 Secondary hyperparathyroidism of renal origin: Secondary | ICD-10-CM | POA: Diagnosis not present

## 2023-04-21 DIAGNOSIS — N250 Renal osteodystrophy: Secondary | ICD-10-CM | POA: Diagnosis not present

## 2023-04-21 DIAGNOSIS — N186 End stage renal disease: Secondary | ICD-10-CM | POA: Diagnosis not present

## 2023-04-21 DIAGNOSIS — D509 Iron deficiency anemia, unspecified: Secondary | ICD-10-CM | POA: Diagnosis not present

## 2023-04-21 DIAGNOSIS — Z992 Dependence on renal dialysis: Secondary | ICD-10-CM | POA: Diagnosis not present

## 2023-04-23 DIAGNOSIS — Z992 Dependence on renal dialysis: Secondary | ICD-10-CM | POA: Diagnosis not present

## 2023-04-23 DIAGNOSIS — D509 Iron deficiency anemia, unspecified: Secondary | ICD-10-CM | POA: Diagnosis not present

## 2023-04-23 DIAGNOSIS — N186 End stage renal disease: Secondary | ICD-10-CM | POA: Diagnosis not present

## 2023-04-23 DIAGNOSIS — N250 Renal osteodystrophy: Secondary | ICD-10-CM | POA: Diagnosis not present

## 2023-04-23 DIAGNOSIS — N2581 Secondary hyperparathyroidism of renal origin: Secondary | ICD-10-CM | POA: Diagnosis not present

## 2023-04-23 DIAGNOSIS — D631 Anemia in chronic kidney disease: Secondary | ICD-10-CM | POA: Diagnosis not present

## 2023-04-26 DIAGNOSIS — N250 Renal osteodystrophy: Secondary | ICD-10-CM | POA: Diagnosis not present

## 2023-04-26 DIAGNOSIS — Z992 Dependence on renal dialysis: Secondary | ICD-10-CM | POA: Diagnosis not present

## 2023-04-26 DIAGNOSIS — N2581 Secondary hyperparathyroidism of renal origin: Secondary | ICD-10-CM | POA: Diagnosis not present

## 2023-04-26 DIAGNOSIS — D631 Anemia in chronic kidney disease: Secondary | ICD-10-CM | POA: Diagnosis not present

## 2023-04-26 DIAGNOSIS — D509 Iron deficiency anemia, unspecified: Secondary | ICD-10-CM | POA: Diagnosis not present

## 2023-04-26 DIAGNOSIS — N186 End stage renal disease: Secondary | ICD-10-CM | POA: Diagnosis not present

## 2023-04-29 DIAGNOSIS — H2513 Age-related nuclear cataract, bilateral: Secondary | ICD-10-CM | POA: Diagnosis not present

## 2023-04-29 DIAGNOSIS — H43811 Vitreous degeneration, right eye: Secondary | ICD-10-CM | POA: Diagnosis not present

## 2023-04-29 DIAGNOSIS — H31013 Macula scars of posterior pole (postinflammatory) (post-traumatic), bilateral: Secondary | ICD-10-CM | POA: Diagnosis not present

## 2023-04-29 DIAGNOSIS — E113513 Type 2 diabetes mellitus with proliferative diabetic retinopathy with macular edema, bilateral: Secondary | ICD-10-CM | POA: Diagnosis not present

## 2023-04-29 DIAGNOSIS — H43823 Vitreomacular adhesion, bilateral: Secondary | ICD-10-CM | POA: Diagnosis not present

## 2023-04-29 DIAGNOSIS — H4313 Vitreous hemorrhage, bilateral: Secondary | ICD-10-CM | POA: Diagnosis not present

## 2023-04-30 DIAGNOSIS — N250 Renal osteodystrophy: Secondary | ICD-10-CM | POA: Diagnosis not present

## 2023-04-30 DIAGNOSIS — N186 End stage renal disease: Secondary | ICD-10-CM | POA: Diagnosis not present

## 2023-04-30 DIAGNOSIS — Z992 Dependence on renal dialysis: Secondary | ICD-10-CM | POA: Diagnosis not present

## 2023-04-30 DIAGNOSIS — D631 Anemia in chronic kidney disease: Secondary | ICD-10-CM | POA: Diagnosis not present

## 2023-04-30 DIAGNOSIS — N2581 Secondary hyperparathyroidism of renal origin: Secondary | ICD-10-CM | POA: Diagnosis not present

## 2023-04-30 DIAGNOSIS — D509 Iron deficiency anemia, unspecified: Secondary | ICD-10-CM | POA: Diagnosis not present

## 2023-05-03 ENCOUNTER — Telehealth: Payer: Self-pay

## 2023-05-03 DIAGNOSIS — D509 Iron deficiency anemia, unspecified: Secondary | ICD-10-CM | POA: Diagnosis not present

## 2023-05-03 DIAGNOSIS — Z992 Dependence on renal dialysis: Secondary | ICD-10-CM | POA: Diagnosis not present

## 2023-05-03 DIAGNOSIS — N250 Renal osteodystrophy: Secondary | ICD-10-CM | POA: Diagnosis not present

## 2023-05-03 DIAGNOSIS — Z122 Encounter for screening for malignant neoplasm of respiratory organs: Secondary | ICD-10-CM

## 2023-05-03 DIAGNOSIS — F17210 Nicotine dependence, cigarettes, uncomplicated: Secondary | ICD-10-CM

## 2023-05-03 DIAGNOSIS — D631 Anemia in chronic kidney disease: Secondary | ICD-10-CM | POA: Diagnosis not present

## 2023-05-03 DIAGNOSIS — N2581 Secondary hyperparathyroidism of renal origin: Secondary | ICD-10-CM | POA: Diagnosis not present

## 2023-05-03 DIAGNOSIS — N186 End stage renal disease: Secondary | ICD-10-CM | POA: Diagnosis not present

## 2023-05-03 DIAGNOSIS — Z87891 Personal history of nicotine dependence: Secondary | ICD-10-CM

## 2023-05-03 NOTE — Telephone Encounter (Signed)
 Lung Cancer Screening Narrative/Criteria Questionnaire (Cigarette Smokers Only- No Cigars/Pipes/vapes)   Jocelyn Wells   SDMV:06/01/2023 at 10:15 am with Orpha Bur          1955-10-12   LDCT: 06/03/2023 at 11:30 am at [facility]    67 y.o.   Phone: [phone]  Lung Screening Narrative (confirm age 64-77 yrs Medicare / 50-80 yrs Private pay insurance)   Insurance information: Medicare/BCBS   Referring Provider: Pardue   This screening involves an initial phone call with a team member from our program. It is called a shared decision making visit. The initial meeting is required by  insurance and Medicare to make sure you understand the program. This appointment takes about 15-20 minutes to complete. You will complete the screening scan at your scheduled date/time.  This scan takes about 5-10 minutes to complete. You can eat and drink normally before and after the scan.  Criteria questions for Lung Cancer Screening:   Are you a current or former smoker? Current Age began smoking: 19   If you are a former smoker, what year did you quit smoking?(within 15 yrs)   To calculate your smoking history, I need an accurate estimate of how many packs of cigarettes you smoked per day and for how many years. (Not just the number of PPD you are now smoking)   Years smoking 49 x Packs per day 3 = Pack years 147   (at least 20 pack yrs)   (Make sure they understand that we need to know how much they have smoked in the past, not just the number of PPD they are smoking now)  Do you have a personal history of cancer?  No    Do you have a family history of cancer? Yes  (cancer type and and relative) Grandmother unsure what kind  Are you coughing up blood?  No  Have you had unexplained weight loss of 15 lbs or more in the last 6 months? No  It looks like you meet all criteria.  When would be a good time for [place] to schedule you for this screening?   Additional information: N/A

## 2023-05-05 DIAGNOSIS — D631 Anemia in chronic kidney disease: Secondary | ICD-10-CM | POA: Diagnosis not present

## 2023-05-05 DIAGNOSIS — N250 Renal osteodystrophy: Secondary | ICD-10-CM | POA: Diagnosis not present

## 2023-05-05 DIAGNOSIS — N186 End stage renal disease: Secondary | ICD-10-CM | POA: Diagnosis not present

## 2023-05-05 DIAGNOSIS — N2581 Secondary hyperparathyroidism of renal origin: Secondary | ICD-10-CM | POA: Diagnosis not present

## 2023-05-05 DIAGNOSIS — Z992 Dependence on renal dialysis: Secondary | ICD-10-CM | POA: Diagnosis not present

## 2023-05-05 DIAGNOSIS — D509 Iron deficiency anemia, unspecified: Secondary | ICD-10-CM | POA: Diagnosis not present

## 2023-05-07 DIAGNOSIS — N2581 Secondary hyperparathyroidism of renal origin: Secondary | ICD-10-CM | POA: Diagnosis not present

## 2023-05-07 DIAGNOSIS — D631 Anemia in chronic kidney disease: Secondary | ICD-10-CM | POA: Diagnosis not present

## 2023-05-07 DIAGNOSIS — D509 Iron deficiency anemia, unspecified: Secondary | ICD-10-CM | POA: Diagnosis not present

## 2023-05-07 DIAGNOSIS — Z992 Dependence on renal dialysis: Secondary | ICD-10-CM | POA: Diagnosis not present

## 2023-05-07 DIAGNOSIS — N186 End stage renal disease: Secondary | ICD-10-CM | POA: Diagnosis not present

## 2023-05-07 DIAGNOSIS — N250 Renal osteodystrophy: Secondary | ICD-10-CM | POA: Diagnosis not present

## 2023-05-09 DIAGNOSIS — N186 End stage renal disease: Secondary | ICD-10-CM | POA: Diagnosis not present

## 2023-05-09 DIAGNOSIS — Z992 Dependence on renal dialysis: Secondary | ICD-10-CM | POA: Diagnosis not present

## 2023-05-10 DIAGNOSIS — N2581 Secondary hyperparathyroidism of renal origin: Secondary | ICD-10-CM | POA: Diagnosis not present

## 2023-05-10 DIAGNOSIS — N250 Renal osteodystrophy: Secondary | ICD-10-CM | POA: Diagnosis not present

## 2023-05-10 DIAGNOSIS — N186 End stage renal disease: Secondary | ICD-10-CM | POA: Diagnosis not present

## 2023-05-10 DIAGNOSIS — D509 Iron deficiency anemia, unspecified: Secondary | ICD-10-CM | POA: Diagnosis not present

## 2023-05-10 DIAGNOSIS — D631 Anemia in chronic kidney disease: Secondary | ICD-10-CM | POA: Diagnosis not present

## 2023-05-10 DIAGNOSIS — Z992 Dependence on renal dialysis: Secondary | ICD-10-CM | POA: Diagnosis not present

## 2023-05-11 ENCOUNTER — Ambulatory Visit: Payer: Medicare Other | Admitting: Family Medicine

## 2023-05-11 ENCOUNTER — Encounter: Payer: Self-pay | Admitting: Family Medicine

## 2023-05-11 ENCOUNTER — Ambulatory Visit
Admission: RE | Admit: 2023-05-11 | Discharge: 2023-05-11 | Disposition: A | Discharge status: Home / Self Care | Source: Ambulatory Visit | Attending: Family Medicine | Admitting: Family Medicine

## 2023-05-11 ENCOUNTER — Telehealth: Payer: Self-pay | Admitting: Student in an Organized Health Care Education/Training Program

## 2023-05-11 VITALS — BP 127/64 | HR 76 | Ht 66.0 in | Wt 205.1 lb

## 2023-05-11 DIAGNOSIS — F339 Major depressive disorder, recurrent, unspecified: Secondary | ICD-10-CM | POA: Diagnosis not present

## 2023-05-11 DIAGNOSIS — Z992 Dependence on renal dialysis: Secondary | ICD-10-CM

## 2023-05-11 DIAGNOSIS — N189 Chronic kidney disease, unspecified: Secondary | ICD-10-CM | POA: Diagnosis not present

## 2023-05-11 DIAGNOSIS — M79604 Pain in right leg: Secondary | ICD-10-CM | POA: Insufficient documentation

## 2023-05-11 DIAGNOSIS — M810 Age-related osteoporosis without current pathological fracture: Secondary | ICD-10-CM

## 2023-05-11 DIAGNOSIS — E113319 Type 2 diabetes mellitus with moderate nonproliferative diabetic retinopathy with macular edema, unspecified eye: Secondary | ICD-10-CM | POA: Diagnosis not present

## 2023-05-11 DIAGNOSIS — E559 Vitamin D deficiency, unspecified: Secondary | ICD-10-CM

## 2023-05-11 DIAGNOSIS — N186 End stage renal disease: Secondary | ICD-10-CM

## 2023-05-11 DIAGNOSIS — M7989 Other specified soft tissue disorders: Secondary | ICD-10-CM | POA: Diagnosis not present

## 2023-05-11 DIAGNOSIS — Z794 Long term (current) use of insulin: Secondary | ICD-10-CM

## 2023-05-11 DIAGNOSIS — Z09 Encounter for follow-up examination after completed treatment for conditions other than malignant neoplasm: Secondary | ICD-10-CM

## 2023-05-11 DIAGNOSIS — J9611 Chronic respiratory failure with hypoxia: Secondary | ICD-10-CM | POA: Diagnosis not present

## 2023-05-11 DIAGNOSIS — Z1231 Encounter for screening mammogram for malignant neoplasm of breast: Secondary | ICD-10-CM

## 2023-05-11 MED ORDER — BUPROPION HCL ER (SR) 150 MG PO TB12: 150.0000 mg | ORAL_TABLET | Freq: Two times a day (BID) | ORAL | 1 refills | Status: DC

## 2023-05-11 NOTE — Patient Instructions (Addendum)
 Clarify torsemide dose - is it 60 mg or 80 mg (most cardiology note says 60 mg).  Please call the [facility] [phone]) to schedule a routine screening mammogram.   Request records from eye doctor and podiatry.  Check with pharmacy that you received Prevnar-20 vaccine (may alternatively get Prevnar-21 if not already received)  Check when last Tdap (tetanus) vaccine received - recommend getting Tdap vaccine in the last ten years.

## 2023-05-11 NOTE — Progress Notes (Signed)
 Established patient visit   Patient: [name]   DOB: July 17, 1955   68 y.o. Female  MRN: [id number] Visit Date: 05/11/2023  Today's healthcare provider: Sherlyn Hay, DO   Chief Complaint  Patient presents with   Medical Management of Chronic Issues   Hyperlipidemia   Anxiety   Depression   Subjective    Hyperlipidemia Associated symptoms include shortness of breath (chronic, at baseline). Pertinent negatives include no chest pain.  Anxiety Symptoms include shortness of breath (chronic, at baseline). Patient reports no chest pain, dizziness, nausea or palpitations.    Depression        Associated symptoms include no appetite change.  Past medical history includes anxiety.     BENNETT VANSCYOC is a 68 year old female with end stage renal disease and diabetes who presents for a follow-up visit.  She has end stage renal disease and is on dialysis. Her calcium levels are monitored weekly at dialysis, and she takes Tums to manage low calcium levels. Her calcium was very low recently. She experiences anemia related to her kidney disease.  She is on an insulin pump for diabetes management, but her blood sugars have been running high, with some readings as high as 600 mg/dL. She is unsure if her phone can connect to her Omnipod pump and currently uses a regular meter for blood sugar monitoring as her insurance does not cover the [employer] due to her dialysis status.  She is awaiting cataract surgery and has significant vision issues, including blurry vision and difficulty recognizing faces unless close up. She receives injections from her retina doctor, which help temporarily but wear off.  She experiences gastrointestinal issues, including diarrhea, which sometimes causes her to miss dialysis sessions. She has not been using meclizine for dizziness recently but keeps it available.  She has a loop recorder implanted in her heart and has had issues with stents in her  legs, experiencing more problems since their placement. She reports a new knot on her leg that is painful to touch. She has a history of blood clots, is currently on Eliquis, and wears compression socks, which have helped reduce leg swelling.  She takes atorvastatin, Paxil, and torsemide, with her son managing her medications. She uses oxygen at home but had a recent issue with her equipment being damaged by puppies.  She no longer has portable oxygen due to having to return the equipment will pay for it herself, which she is unable to do.  She has a history of falls and uses a walker.      Medications: Outpatient Medications Prior to Visit  Medication Sig   albuterol (VENTOLIN HFA) 108 (90 Base) MCG/ACT inhaler TAKE 2 PUFFS BY MOUTH EVERY 6 HOURS AS NEEDED FOR WHEEZE OR SHORTNESS OF BREATH (Patient taking differently: Inhale 2 puffs into the lungs every 6 (six) hours as needed for wheezing or shortness of breath.)   apixaban (ELIQUIS) 5 MG TABS tablet Take 1 tablet (5 mg total) by mouth 2 (two) times daily.   atorvastatin (LIPITOR) 40 MG tablet Take 1 tablet (40 mg total) by mouth daily.   calcium carbonate (TUMS EX) 750 MG chewable tablet Chew 1 tablet by mouth daily.   Dulaglutide (TRULICITY) 4.5 MG/0.5ML SOPN Inject 4.5 mg into the skin every 7 (seven) days. (Patient taking differently: Inject 4.5 mg into the skin every 7 (seven) days. Mondays)   fluticasone (FLONASE) 50 MCG/ACT nasal spray Place 2 sprays into both nostrils  daily. (Patient taking differently: Place 2 sprays into both nostrils as needed.)   gabapentin (NEURONTIN) 300 MG capsule Take 300 mg by mouth daily.   hydroxyurea (HYDREA) 500 MG capsule TAKE 1 CAPSULE BY MOUTH EVERY OTHER DAY. MAY TAKE WITH FOOD TO MINIMIZE GI SIDE EFFECTS.   insulin aspart (NOVOLOG) 100 UNIT/ML injection Insulin pump   Insulin Disposable Pump (OMNIPOD DASH PODS, GEN 4,) MISC USE 1 POD EACH EVERY 72    HOURS   Insulin Human (INSULIN PUMP) SOLN Per  endocrinoloy   loratadine (CLARITIN) 10 MG tablet Take 1 tablet by mouth daily.   meclizine (ANTIVERT) 12.5 MG tablet Take 1 tablet (12.5 mg total) by mouth 3 (three) times daily as needed for dizziness.   OXYGEN Place 2 L into the nose continuous.   PARoxetine (PAXIL) 40 MG tablet Take 1 tablet (40 mg total) by mouth daily.   Spacer/Aero-Holding Chambers [facility] DIAMOND) MISC Use with inhaler to ensure medication is delivered throughout lungs   torsemide (DEMADEX) 20 MG tablet TAKE 3 TABLETS BY MOUTH EVERY DAY (Patient taking differently: Take 40 mg by mouth daily. TAKE 2 TABLETS BY MOUTH EVERY DAY)   traMADol (ULTRAM) 50 MG tablet Take 50 mg by mouth 2 (two) times daily as needed.   [DISCONTINUED] buPROPion (WELLBUTRIN SR) 150 MG 12 hr tablet Take 1 tablet (150 mg total) by mouth 2 (two) times daily.   losartan (COZAAR) 50 MG tablet Take 50 mg by mouth daily.   Vitamin D, Ergocalciferol, (DRISDOL) 1.25 MG (50000 UNIT) CAPS capsule Take 50,000 Units by mouth every 7 (seven) days. Sunday (Patient not taking: Reported on 05/11/2023)   No facility-administered medications prior to visit.    Review of Systems  Constitutional:  Negative for appetite change, chills and fever.  Respiratory:  Positive for shortness of breath (chronic, at baseline). Negative for chest tightness.   Cardiovascular:  Negative for chest pain and palpitations.  Gastrointestinal:  Negative for abdominal pain, nausea and vomiting.  Musculoskeletal:  Positive for gait problem (requires walker at baseline; frequent falls).  Neurological:  Negative for dizziness and weakness.  Psychiatric/Behavioral:  Positive for depression.         Objective    BP 127/64 (BP Location: Right Arm, Patient Position: Sitting, Cuff Size: Large)   Pulse 76   Ht 5\' 6"  (1.676 m)   Wt 205 lb 1.6 oz (93 kg)   SpO2 96%   BMI 33.10 kg/m     Physical Exam Vitals and nursing note reviewed.  Constitutional:      General: She is not in  acute distress.    Appearance: Normal appearance.  HENT:     Head: Normocephalic and atraumatic.  Eyes:     General: No scleral icterus.    Conjunctiva/sclera: Conjunctivae normal.  Cardiovascular:     Rate and Rhythm: Normal rate.  Pulmonary:     Effort: Pulmonary effort is normal.  Musculoskeletal:     Comments: Small swollen area, tender to touch, on lateral right leg.  Neurological:     Mental Status: She is alert and oriented to person, place, and time. Mental status is at baseline.  Psychiatric:        Mood and Affect: Mood normal.        Behavior: Behavior normal.      No results found for any visits on 05/11/23.  Assessment & Plan    Type 2 diabetes mellitus with moderate nonproliferative retinopathy and macular edema, with long-term current use of  insulin, unspecified laterality (HCC)  Follow-up exam, less than 3 months since previous exam  ESRD on dialysis (HCC) -     VITAMIN D 25 Hydroxy (Vit-D Deficiency, Fractures)  Hypocalcemia due to chronic kidney disease Assessment & Plan: Monitored regularly (weekly) at dialysis. Patient to continue daily Tums. Defer to specialist management.    Pain and swelling of right lower extremity -     US Venous Img Lower Unilateral Right (DVT)  Depression, recurrent (HCC) -     buPROPion HCl ER (SR); Take 1 tablet (150 mg total) by mouth 2 (two) times daily.  Dispense: 180 tablet; Refill: 1  Chronic hypoxic respiratory failure, on home oxygen therapy (HCC)  Vitamin D deficiency -     VITAMIN D 25 Hydroxy (Vit-D Deficiency, Fractures)  Encounter for screening mammogram for breast cancer -     3D Screening Mammogram, Left and Right; Future  Osteoporosis, post-menopausal   Diabetes Mellitus with moderate nonproliferative retinopathy and macular edema, with long-term current use of insulin Hyperglycemia persists with levels up to 600 mg/dL despite insulin pump. Omnipod app may enhance management.  Patient reports she is no  longer eligible to receive a continuous glucose monitor paid for by Medicare due to requiring dialysis, based upon what she was told. - Educated on Omnipod app for insulin pump control via phone. - Encouraged consistent glucose monitoring. - Follow up with endocrinology for diabetes management.  Diabetic Retinopathy Managed by retina specialist with injections. Vision remains blurry. Awaiting cataract surgery. - Continue follow-up with retina specialist. - Request records from retina specialist. - Follow up on scheduling cataract surgery.  End-Stage Renal Disease (ESRD) On dialysis with anemia. Misses sessions due to GI issues. Hard stick for blood draws. Vitamin D monitoring needed. - Encourage adherence to dialysis schedule. - Order vitamin D level and coordinate with dialysis center for blood draw. - Monitor and manage anemia.  Hypocalcemia Monitored at dialysis. Prescribed Tums. Regular intake crucial. - Continue Tums as prescribed. - Ensure regular monitoring of calcium levels at dialysis.  Peripheral Edema with Possible Hematoma Leg hematoma likely due to Eliquis. History of blood clots. Compression socks reduce swelling. - Order stat ultrasound to rule out blood clot, given recent occurrence and larger diameter of right leg relative to left leg. - Continue wearing compression socks. - Monitor for changes in leg swelling.  Chronic Pain Management Chronic pain with limited tramadol. Pain management contract restricts options. - Advise contacting pain management for earlier appointment or alternative options.  Major depressive disorder Patient states she is doing well at this time.  Refilled patient's bupropion today.  Chronic hypoxic respiratory failure, on home oxygen Has not had her oxygen available for the last couple of days due to her bodies damaging her equipment.  She reports her husband has purchased new tubing for her oxygen and she will be able to start it again  soon.  She no longer has portable oxygen and reports dyspnea on exertion but did saturate well in the office today.  General Health Maintenance Due for screenings and vaccinations. Declined COVID booster. Uncertain about pneumonia vaccination. Bone density scan ordered. - Order mammogram and instruct scheduling at [facility]. - Verify pneumonia vaccination status and administer Prevnar 20 if needed. - Advise tetanus vaccine at pharmacy if not received in the last 10 years.    Follow-up Requires follow-up for health issues and screenings. Coordination with specialists needed. - Schedule follow-up appointment in 3 months. - Ensure records from retina specialist and  podiatry are sent to primary care. - Coordinate with dialysis center for vitamin D level draw.   Return in about 3 months (around 08/10/2023) for Chronic f/u.      I discussed the assessment and treatment plan with the patient  The patient was provided an opportunity to ask questions and all were answered. The patient agreed with the plan and demonstrated an understanding of the instructions.   The patient was advised to call back or seek an in-person evaluation if the symptoms worsen or if the condition fails to improve as anticipated.  Total time was 50 minutes. That includes chart review before the visit, the actual patient visit, and time spent on documentation after the visit.    Sherlyn Hay, DO  [facility] [phone] (phone) [phone] (fax)  [facility]

## 2023-05-11 NOTE — Telephone Encounter (Signed)
 Are you OK to do a VV for this patient? If so, please send to the front office staff so they can make her an appointment. Thanks

## 2023-05-11 NOTE — Assessment & Plan Note (Signed)
 Monitored regularly (weekly) at dialysis. Patient to continue daily Tums. Defer to specialist management.

## 2023-05-11 NOTE — Telephone Encounter (Signed)
 PT called stated that she need a refill on Tramadol. I was going to schedule patient to come in on 05-26-23. However patient stated that she has dialysis on Tues and Thurs. Wants to know if she can have a vv ,appt.

## 2023-05-12 DIAGNOSIS — N186 End stage renal disease: Secondary | ICD-10-CM | POA: Diagnosis not present

## 2023-05-12 DIAGNOSIS — D509 Iron deficiency anemia, unspecified: Secondary | ICD-10-CM | POA: Diagnosis not present

## 2023-05-12 DIAGNOSIS — N2581 Secondary hyperparathyroidism of renal origin: Secondary | ICD-10-CM | POA: Diagnosis not present

## 2023-05-12 DIAGNOSIS — D631 Anemia in chronic kidney disease: Secondary | ICD-10-CM | POA: Diagnosis not present

## 2023-05-12 DIAGNOSIS — N250 Renal osteodystrophy: Secondary | ICD-10-CM | POA: Diagnosis not present

## 2023-05-12 DIAGNOSIS — Z992 Dependence on renal dialysis: Secondary | ICD-10-CM | POA: Diagnosis not present

## 2023-05-14 DIAGNOSIS — N2581 Secondary hyperparathyroidism of renal origin: Secondary | ICD-10-CM | POA: Diagnosis not present

## 2023-05-14 DIAGNOSIS — D509 Iron deficiency anemia, unspecified: Secondary | ICD-10-CM | POA: Diagnosis not present

## 2023-05-14 DIAGNOSIS — D631 Anemia in chronic kidney disease: Secondary | ICD-10-CM | POA: Diagnosis not present

## 2023-05-14 DIAGNOSIS — Z992 Dependence on renal dialysis: Secondary | ICD-10-CM | POA: Diagnosis not present

## 2023-05-14 DIAGNOSIS — N250 Renal osteodystrophy: Secondary | ICD-10-CM | POA: Diagnosis not present

## 2023-05-14 DIAGNOSIS — N186 End stage renal disease: Secondary | ICD-10-CM | POA: Diagnosis not present

## 2023-05-15 NOTE — Progress Notes (Signed)
 MRN : [id number]  Jocelyn Wells is a 68 y.o. (December 08, 1955) female who presents with chief complaint of check access.  History of Present Illness:   The patient returns to the office for followup regarding atherosclerotic changes of the lower extremities and review of the noninvasive studies as well as evaluation of her dialysis access.   There have been no interval changes in lower extremity symptoms. No interval shortening of the patient's claudication distance or development of rest pain symptoms. No new ulcers or wounds have occurred since the last visit.  She reports dialysis has been going well.  She denies prolonged bleeding or difficulty with cannulation.  She denies hand pain.  She denies dialysis stating they are having any difficulties.  There have been no significant changes to the patient's overall health care.  The patient denies amaurosis fugax or recent TIA symptoms. There are no documented recent neurological changes noted. There is no history of DVT, PE or superficial thrombophlebitis. The patient denies recent episodes of angina or shortness of breath.   ABI Rt=1.08 and Lt=1.24  (previous ABI's Rt=0.75 and Lt=1.19).  Duplex ultrasound of the left arm brachial axillary AV graft demonstrates a flow volume of 2475 cc/min.  In the proximal graft there is a moderate change in diameter but there is not a true pulling of the velocity identified consistent with a greater than 70% stenosis.   No outpatient medications have been marked as taking for the 05/16/23 encounter (Appointment) with Prescilla Brod, Ninette Basque, MD.    Past Medical History:  Diagnosis Date   Acute ischemic left MCA stroke (HCC) 01/29/2015   a.) MRI brain 01/29/2015: acute left MCA territory infarcts involving the posterior left frontal lobe and left insular cortex   Acute renal failure superimposed on stage 4 chronic kidney disease (HCC) 01/29/2015   Anemia of chronic renal failure     Anxiety    Arthritis    Complication of anesthesia    a.) PONV; b.) awareness under anesthesia   COPD (chronic obstructive pulmonary disease) (HCC)    Depression    Dyspnea    ESRD (end stage renal disease) (HCC)    Headache    HFrEF (heart failure with reduced ejection fraction) (HCC)    a.) TTE 01/30/2015: EF 30-35%, diff HK, LAE, G2DD; b.) TTE 06/13/2015: EF 40-45%, diff HK, G1DD; c.) TTE 10/06/2017: EF 60-65% MAC, no IAS; d.) TTE 09/29/2021: EF 45-50%;, LVH, LAE, AoV sclerosis, G1DD; e.) TTE 02/24/2022: EF 60-65%, no IAS   HTN (hypertension)    Leukocytosis    Long term current use of anticoagulant    a.) apixaban   Long term current use of antithrombotics/antiplatelets    a.) clopidogrel   NICM (nonischemic cardiomyopathy) (HCC)    a.) TTE 01/30/2015: EF 30-35%; b.) R/LHC 03/25/2015: EF 25-30%, mRA 13, mPA 30, mPCWP 26, LVEDP 17, CO 3.66, CI 1.8; c.) TTE 06/13/2015: EF 40-45%; d.) TTE 10/06/2017: EF 60-65%; e.) TTE 09/29/2021: EF 45-50%; f.) TTE 02/24/2022: EF 60-65%   Obesity    On supplemental oxygen by nasal cannula    a.) 2L/[place] at night   Osteoporosis    Peripheral vascular disease (HCC)    Type 2 diabetes mellitus treated with insulin [facility])     Past Surgical History:  Procedure Laterality Date   ABDOMINAL HYSTERECTOMY     ANKLE FRACTURE SURGERY Left 02/09/2000   AV  FISTULA PLACEMENT Left 06/18/2022   Procedure: ARTERIOVENOUS (AV) FISTULA CREATION (BRACHIAL AXILLA);  Surgeon: Jackquelyn Mass, MD;  Location: [facility] ORS;  Service: Vascular;  Laterality: Left;   BILATERAL SALPINGOOPHORECTOMY  02/08/1998   CARDIAC CATHETERIZATION N/A 03/25/2015   Procedure: Right/Left Heart Cath and Coronary Angiography;  Surgeon: Arty Binning, MD;  Location: [facility] INVASIVE CV LAB;  Service: Cardiovascular;  Laterality: N/A;   CESAREAN SECTION  02/09/1976   COLONOSCOPY WITH PROPOFOL N/A 09/22/2016   Procedure: COLONOSCOPY WITH PROPOFOL;  Surgeon: Cassie Click, MD;  Location: [facility]  ENDOSCOPY;  Service: Endoscopy;  Laterality: N/A;   COLONOSCOPY WITH PROPOFOL N/A 02/15/2022   Procedure: COLONOSCOPY WITH PROPOFOL;  Surgeon: Selena Daily, MD;  Location: [facility] ENDOSCOPY;  Service: Gastroenterology;  Laterality: N/A;   DIALYSIS/PERMA CATHETER INSERTION N/A 01/19/2023   Procedure: DIALYSIS/PERMA CATHETER INSERTION;  Surgeon: Celso College, MD;  Location: [facility] INVASIVE CV LAB;  Service: Cardiovascular;  Laterality: N/A;   EP IMPLANTABLE DEVICE N/A 08/05/2015   Procedure: Loop Recorder Insertion;  Surgeon: Verona Goodwill, MD;  Location: [facility] INVASIVE CV LAB;  Service: Cardiovascular;  Laterality: N/A;   ESOPHAGOGASTRODUODENOSCOPY N/A 02/15/2022   Procedure: ESOPHAGOGASTRODUODENOSCOPY (EGD);  Surgeon: Selena Daily, MD;  Location: [facility] ENDOSCOPY;  Service: Gastroenterology;  Laterality: N/A;   ESOPHAGOGASTRODUODENOSCOPY (EGD) WITH PROPOFOL N/A 09/22/2016   Procedure: ESOPHAGOGASTRODUODENOSCOPY (EGD) WITH PROPOFOL;  Surgeon: Cassie Click, MD;  Location: [facility] ENDOSCOPY;  Service: Endoscopy;  Laterality: N/A;   ESOPHAGOGASTRODUODENOSCOPY (EGD) WITH PROPOFOL N/A 12/08/2016   Procedure: ESOPHAGOGASTRODUODENOSCOPY (EGD) WITH PROPOFOL;  Surgeon: Cassie Click, MD;  Location: [facility] ENDOSCOPY;  Service: Endoscopy;  Laterality: N/A;   ESOPHAGOGASTRODUODENOSCOPY (EGD) WITH PROPOFOL N/A 10/12/2022   Procedure: ESOPHAGOGASTRODUODENOSCOPY (EGD) WITH PROPOFOL;  Surgeon: Selena Daily, MD;  Location: [facility] ENDOSCOPY;  Service: Gastroenterology;  Laterality: N/A;   FACIAL LACERATION REPAIR N/A 12/27/2022   Procedure: FACIAL LACERATION REPAIR;  Surgeon: Rush Coupe, MD;  [facility] OR;  Service: ENT;  Laterality: N/A;   FRACTURE SURGERY     GIVENS CAPSULE STUDY N/A 04/19/2022   Procedure: GIVENS CAPSULE STUDY;  Surgeon: Selena Daily, MD;  Location: [facility] ENDOSCOPY;  Service: Gastroenterology;  Laterality: N/A;   GIVENS CAPSULE STUDY N/A 10/12/2022   Procedure: GIVENS  CAPSULE STUDY;  Surgeon: Selena Daily, MD;  Location: [facility] ENDOSCOPY;  Service: Gastroenterology;  Laterality: N/A;   INSERTION HYBRID ANTERIOVENOUS GORTEX GRAFT  06/18/2022   Procedure: INSERTION HYBRID ANTERIOVENOUS GORTEX GRAFT;  Surgeon: Jackquelyn Mass, MD;  Location: [facility] ORS;  Service: Vascular;;   KNEE ARTHROSCOPY Left 02/09/2003   LOWER EXTREMITY ANGIOGRAPHY Left 08/12/2021   Procedure: Lower Extremity Angiography;  Surgeon: Jackquelyn Mass, MD;  Location: [facility] INVASIVE CV LAB;  Service: Cardiovascular;  Laterality: Left;   PERIPHERAL VASCULAR THROMBECTOMY Left 01/19/2023   Procedure: PERIPHERAL VASCULAR THROMBECTOMY;  Surgeon: Celso College, MD;  Location: [facility] INVASIVE CV LAB;  Service: Cardiovascular;  Laterality: Left;   TEE WITHOUT CARDIOVERSION N/A 01/31/2015   Procedure: TRANSESOPHAGEAL ECHOCARDIOGRAM (TEE);  Surgeon: Lenise Quince, MD;  Location: [facility] ENDOSCOPY;  Service: Cardiovascular;  Laterality: N/A;   TEMPORARY DIALYSIS CATHETER N/A 08/14/2021   Procedure: TEMPORARY DIALYSIS CATHETER;  Surgeon: Jackquelyn Mass, MD;  Location: [facility] INVASIVE CV LAB;  Service: Cardiovascular;  Laterality: N/A;   TUBAL LIGATION  02/09/1976   ULNAR NERVE TRANSPOSITION  02/08/2006    Social History Social History   Tobacco Use   Smoking status: Every Day    Current packs/day:  0.00    Average packs/day: 0.5 packs/day for 48.3 years (24.2 ttl pk-yrs)    Types: Cigarettes    Start date: 02/08/1974    Last attempt to quit: 05/31/2022    Years since quitting: 0.9   Smokeless tobacco: Never  Vaping Use   Vaping status: Never Used  Substance Use Topics   Alcohol use: No    Alcohol/week: 0.0 standard drinks of alcohol   Drug use: No    Family History Family History  Problem Relation Age of Onset   Heart disease Mother        died from CHF   Asthma Mother    Diabetes Mother    Heart disease Father    Aneurysm Father    COPD Brother    Diabetes Brother    Alcohol  abuse Paternal Aunt    Cancer Maternal Grandmother        unknownorigin   Anemia Neg Hx    Arrhythmia Neg Hx    Clotting disorder Neg Hx    Fainting Neg Hx    Heart attack Neg Hx    Heart failure Neg Hx    Hyperlipidemia Neg Hx    Hypertension Neg Hx     Allergies  Allergen Reactions   Codeine Nausea And Vomiting   Ivp Dye [Iodinated Contrast Media]     Patients states the IV Dye shuts her kidneys down     REVIEW OF SYSTEMS (Negative unless checked)  Constitutional: [] Weight loss  [] Fever  [] Chills Cardiac: [] Chest pain   [] Chest pressure   [] Palpitations   [] Shortness of breath when laying flat   [] Shortness of breath with exertion. Vascular:  [] Pain in legs with walking   [] Pain in legs at rest  [] History of DVT   [] Phlebitis   [] Swelling in legs   [] Varicose veins   [] Non-healing ulcers Pulmonary:   [] Uses home oxygen   [] Productive cough   [] Hemoptysis   [] Wheeze  [] COPD   [] Asthma Neurologic:  [] Dizziness   [] Seizures   [] History of stroke   [] History of TIA  [] Aphasia   [] Vissual changes   [] Weakness or numbness in arm   [] Weakness or numbness in leg Musculoskeletal:   [] Joint swelling   [] Joint pain   [] Low back pain Hematologic:  [] Easy bruising  [] Easy bleeding   [] Hypercoagulable state   [] Anemic Gastrointestinal:  [] Diarrhea   [] Vomiting  [] Gastroesophageal reflux/heartburn   [] Difficulty swallowing. Genitourinary:  [x] Chronic kidney disease   [] Difficult urination  [] Frequent urination   [] Blood in urine Skin:  [] Rashes   [] Ulcers  Psychological:  [] History of anxiety   []  History of major depression.  Physical Examination  There were no vitals filed for this visit. There is no height or weight on file to calculate BMI. Gen: WD/WN, NAD Head: [place]/AT, No temporalis wasting.  Ear/Nose/Throat: Hearing grossly intact, nares w/o erythema or drainage Eyes: PER, EOMI, sclera nonicteric.  Neck: Supple, no gross masses or lesions.  No JVD.  Pulmonary:  Good air movement,  no audible wheezing, no use of accessory muscles.  Cardiac: RRR, precordium non-hyperdynamic. Vascular:   Left arm brachial axillary AV graft has a good thrill good bruit there is minimal pulsatility noted Vessel Right Left  Radial Palpable Palpable  Brachial Palpable Palpable  Gastrointestinal: soft, non-distended. No guarding/no peritoneal signs.  Musculoskeletal: M/S 5/5 throughout.  No deformity.  Neurologic: CN 2-12 intact. Pain and light touch intact in extremities.  Symmetrical.  Speech is fluent. Motor exam as listed above. Psychiatric:  Judgment intact, Mood & affect appropriate for pt's clinical situation. Dermatologic: No rashes or ulcers noted.  No changes consistent with cellulitis.   CBC Lab Results  Component Value Date   WBC 7.6 03/07/2023   HGB 10.6 (L) 03/07/2023   HCT 35.2 (L) 03/07/2023   MCV 98.1 03/07/2023   PLT 426 (H) 03/07/2023    BMET    Component Value Date/Time   NA 137 03/07/2023 1548   NA 140 12/04/2021 1542   K 4.8 03/07/2023 1548   CL 101 03/07/2023 1548   CO2 24 03/07/2023 1548   GLUCOSE 96 03/07/2023 1548   BUN 46 (H) 03/07/2023 1548   BUN 38 (H) 12/04/2021 1542   CREATININE 4.33 (H) 03/07/2023 1548   CREATININE 4.25 (H) 03/07/2023 1401   CREATININE 1.37 (H) 03/21/2015 1451   CALCIUM 6.1 (LL) 03/07/2023 1548   GFRNONAA 11 (L) 03/07/2023 1548   GFRNONAA 11 (L) 03/07/2023 1401   GFRNONAA 13 04/22/2022 1233   GFRAA 33 (L) 04/23/2019 1446   CrCl cannot be calculated (Patient's most recent lab result is older than the maximum 21 days allowed.).  COAG Lab Results  Component Value Date   INR 1.6 (H) 12/27/2022   INR 1.5 (H) 08/14/2021   INR 0.93 10/05/2017    Radiology US Venous Img Lower Unilateral Right (DVT) Result Date: 05/11/2023 CLINICAL DATA:  Right greater than left lower extremity swelling with tenderness. EXAM: RIGHT LOWER EXTREMITY VENOUS DOPPLER ULTRASOUND TECHNIQUE: Gray-scale sonography with compression, as well as color  and duplex ultrasound, were performed to evaluate the deep venous system(s) from the level of the common femoral vein through the popliteal and proximal calf veins. COMPARISON:  06/02/2021 FINDINGS: VENOUS Normal compressibility of the common femoral, superficial femoral, and popliteal veins, as well as the visualized calf veins. Visualized portions of profunda femoral vein and great saphenous vein unremarkable. No filling defects to suggest DVT on grayscale or color Doppler imaging. Doppler waveforms show normal direction of venous flow, normal respiratory plasticity and response to augmentation. Limited views of the contralateral common femoral vein are unremarkable. OTHER None. Limitations: none IMPRESSION: No right lower extremity DVT. Electronically Signed   By: Acquanetta Belling M.D.   On: 05/11/2023 13:55     Assessment/Plan 1. ESRD on dialysis [facility]) (Primary) Recommend:  The patient is doing well and currently has adequate dialysis access. The patient's dialysis center is not reporting any access issues. Flow pattern is stable when compared to the prior ultrasound.  The patient should have a duplex ultrasound of the dialysis access in 6 months. The patient will follow-up with me in the office after each ultrasound   - VAS US DUPLEX DIALYSIS ACCESS (AVF, AVG); Future  2. Atherosclerosis of native artery of both lower extremities with intermittent claudication (HCC)  Recommend:  The patient has evidence of atherosclerosis of the lower extremities with claudication.  The patient does not voice lifestyle limiting changes at this point in time.  Noninvasive studies do not suggest clinically significant change.  No invasive studies, angiography or surgery at this time The patient should continue walking and begin a more formal exercise program.  The patient should continue antiplatelet therapy and aggressive treatment of the lipid abnormalities  No changes in the patient's medications at  this time  Continued surveillance is indicated as atherosclerosis is likely to progress with time.    The patient will continue follow up with noninvasive studies as ordered.   3. Chronic obstructive pulmonary disease, unspecified COPD type (  HCC) Continue pulmonary medications and aerosols as already ordered, these medications have been reviewed and there are no changes at this time.   4. Essential hypertension Continue antihypertensive medications as already ordered, these medications have been reviewed and there are no changes at this time.  5. Hyperlipidemia associated with type 2 diabetes mellitus (HCC) Continue statin as ordered and reviewed, no changes at this time  6. Type 2 diabetes mellitus with other diabetic kidney complication (HCC) Continue hypoglycemic medications as already ordered, these medications have been reviewed and there are no changes at this time.  Hgb A1C to be monitored as already arranged by primary service    Devon Fogo, MD  05/15/2023 4:24 PM

## 2023-05-16 ENCOUNTER — Ambulatory Visit (INDEPENDENT_AMBULATORY_CARE_PROVIDER_SITE_OTHER): Admitting: Vascular Surgery

## 2023-05-16 ENCOUNTER — Ambulatory Visit (INDEPENDENT_AMBULATORY_CARE_PROVIDER_SITE_OTHER)

## 2023-05-16 ENCOUNTER — Encounter (INDEPENDENT_AMBULATORY_CARE_PROVIDER_SITE_OTHER): Payer: Self-pay | Admitting: Vascular Surgery

## 2023-05-16 VITALS — BP 115/74 | HR 74 | Resp 18 | Ht 66.0 in | Wt 205.0 lb

## 2023-05-16 DIAGNOSIS — J449 Chronic obstructive pulmonary disease, unspecified: Secondary | ICD-10-CM | POA: Diagnosis not present

## 2023-05-16 DIAGNOSIS — E785 Hyperlipidemia, unspecified: Secondary | ICD-10-CM | POA: Diagnosis not present

## 2023-05-16 DIAGNOSIS — E1169 Type 2 diabetes mellitus with other specified complication: Secondary | ICD-10-CM

## 2023-05-16 DIAGNOSIS — I739 Peripheral vascular disease, unspecified: Secondary | ICD-10-CM | POA: Diagnosis not present

## 2023-05-16 DIAGNOSIS — I10 Essential (primary) hypertension: Secondary | ICD-10-CM | POA: Diagnosis not present

## 2023-05-16 DIAGNOSIS — Z992 Dependence on renal dialysis: Secondary | ICD-10-CM | POA: Diagnosis not present

## 2023-05-16 DIAGNOSIS — N186 End stage renal disease: Secondary | ICD-10-CM

## 2023-05-16 DIAGNOSIS — I70213 Atherosclerosis of native arteries of extremities with intermittent claudication, bilateral legs: Secondary | ICD-10-CM | POA: Diagnosis not present

## 2023-05-16 DIAGNOSIS — Z9889 Other specified postprocedural states: Secondary | ICD-10-CM | POA: Diagnosis not present

## 2023-05-16 DIAGNOSIS — E1129 Type 2 diabetes mellitus with other diabetic kidney complication: Secondary | ICD-10-CM

## 2023-05-17 DIAGNOSIS — Z992 Dependence on renal dialysis: Secondary | ICD-10-CM | POA: Diagnosis not present

## 2023-05-17 DIAGNOSIS — D509 Iron deficiency anemia, unspecified: Secondary | ICD-10-CM | POA: Diagnosis not present

## 2023-05-17 DIAGNOSIS — N2581 Secondary hyperparathyroidism of renal origin: Secondary | ICD-10-CM | POA: Diagnosis not present

## 2023-05-17 DIAGNOSIS — N250 Renal osteodystrophy: Secondary | ICD-10-CM | POA: Diagnosis not present

## 2023-05-17 DIAGNOSIS — N186 End stage renal disease: Secondary | ICD-10-CM | POA: Diagnosis not present

## 2023-05-17 DIAGNOSIS — D631 Anemia in chronic kidney disease: Secondary | ICD-10-CM | POA: Diagnosis not present

## 2023-05-18 ENCOUNTER — Ambulatory Visit
Attending: Student in an Organized Health Care Education/Training Program | Admitting: Student in an Organized Health Care Education/Training Program

## 2023-05-18 ENCOUNTER — Encounter: Payer: Self-pay | Admitting: Student in an Organized Health Care Education/Training Program

## 2023-05-18 VITALS — BP 116/60 | HR 75 | Temp 97.3°F | Resp 16 | Ht 64.0 in | Wt 205.0 lb

## 2023-05-18 DIAGNOSIS — M1712 Unilateral primary osteoarthritis, left knee: Secondary | ICD-10-CM | POA: Diagnosis not present

## 2023-05-18 DIAGNOSIS — G8929 Other chronic pain: Secondary | ICD-10-CM | POA: Diagnosis not present

## 2023-05-18 DIAGNOSIS — E1140 Type 2 diabetes mellitus with diabetic neuropathy, unspecified: Secondary | ICD-10-CM | POA: Diagnosis not present

## 2023-05-18 DIAGNOSIS — S32010S Wedge compression fracture of first lumbar vertebra, sequela: Secondary | ICD-10-CM | POA: Diagnosis not present

## 2023-05-18 DIAGNOSIS — M25562 Pain in left knee: Secondary | ICD-10-CM | POA: Insufficient documentation

## 2023-05-18 DIAGNOSIS — S22000S Wedge compression fracture of unspecified thoracic vertebra, sequela: Secondary | ICD-10-CM | POA: Diagnosis not present

## 2023-05-18 DIAGNOSIS — G894 Chronic pain syndrome: Secondary | ICD-10-CM | POA: Insufficient documentation

## 2023-05-18 DIAGNOSIS — M48061 Spinal stenosis, lumbar region without neurogenic claudication: Secondary | ICD-10-CM | POA: Insufficient documentation

## 2023-05-18 DIAGNOSIS — S22000A Wedge compression fracture of unspecified thoracic vertebra, initial encounter for closed fracture: Secondary | ICD-10-CM | POA: Diagnosis not present

## 2023-05-18 MED ORDER — TRAMADOL HCL 50 MG PO TABS: 50.0000 mg | ORAL_TABLET | Freq: Two times a day (BID) | ORAL | 2 refills | Status: DC | PRN

## 2023-05-18 NOTE — Progress Notes (Signed)
 PROVIDER NOTE: Interpretation of information contained herein should be left to medically-trained personnel. Specific patient instructions are provided elsewhere under "Patient Instructions" section of medical record. This document was created in part using AI and STT-dictation technology, any transcriptional errors that may result from this process are unintentional.  Patient: Jocelyn Wells  Service: E/M   PCP: Sherlyn Hay, DO  DOB: 01-05-1956  DOS: 05/18/2023  Provider: Edward Jolly, MD  MRN: [id number]  Delivery: Face-to-face  Specialty: Interventional Pain Management  Type: Established Patient  Setting: Ambulatory outpatient facility  Specialty designation: 09  Referring Prov.: Sherlyn Hay, DO  Location: Outpatient office facility       HPI  Jocelyn Wells, a 68 y.o. year old female, is here today because of her Chronic pain of left knee [M25.562, G89.29]. Jocelyn Wells primary complain today is Back Pain (lower)  Pain Assessment: Severity of Chronic pain is reported as a 5 /10. Location: Back Lower, Right, Left/down back of legs bilateral to feet. Onset: More than a month ago. Quality: Aching, Constant, Discomfort, Shooting, Sore, Numbness. Timing: Constant. Modifying factor(s): Qutenza, rest. Vitals:  height is 5\' 4"  (1.626 m) and weight is 205 lb (93 kg). Her temperature is 97.3 F (36.3 C) (abnormal). Her blood pressure is 116/60 and her pulse is 75. Her respiration is 16 and oxygen saturation is 97%.  BMI: Estimated body mass index is 35.19 kg/m as calculated from the following:   Height as of this encounter: 5\' 4"  (1.626 m).   Weight as of this encounter: 205 lb (93 kg). Last encounter: 04/13/2023. Last procedure: 03/09/2023.  Reason for encounter: medication management. The patient indicates doing well with the current medication regimen. No adverse reaction or side effects reported to the medication. Patient expressed interest in repeating Qutenza treatment as it has been 6  months since the last treatment on 10/25/2022. She also had a left genicular nerve block on 03/09/2023 and is still endorsing 100% pain relief in her left knee pain.  Continue to monitor, repeat as needed and or consider left genicular nerve RFA. Pharmacotherapy Assessment  Analgesic: Tramadol (Ultram) 50 mg tab 2 times per day as needed for her pain  Monitoring: [place] PMP: PDMP reviewed during this encounter.       Pharmacotherapy: No side-effects or adverse reactions reported. Compliance: No problems identified. Effectiveness: Clinically acceptable.  Jocelyn Pies, RN  05/18/2023  3:42 PM  Sign when Signing Visit Nursing Pain Medication Assessment:  Safety precautions to be maintained throughout the outpatient stay will include: orient to surroundings, keep bed in low position, maintain call bell within reach at all times, provide assistance with transfer out of bed and ambulation.  Medication Inspection Compliance: Pill count conducted under aseptic conditions, in front of the patient. Neither the pills nor the bottle was removed from the patient's sight at any time. Once count was completed pills were immediately returned to the patient in their original bottle.  Medication: Tramadol (Ultram) Pill/Patch Count:  0 of 60 pills remain Pill/Patch Appearance: Markings consistent with prescribed medication Bottle Appearance: Standard pharmacy container. Clearly labeled. Filled Date: 3 / 69 / 2025 Last Medication intake:  Ran out of medicine more than 48 hours ago    No results found for: "CBDTHCR" No results found for: "D8THCCBX" No results found for: "D9THCCBX"  UDS:  Summary  Date Value Ref Range Status  10/25/2022 Note  Final    Comment:    ==================================================================== [employer] Select 13 (MW) ==================================================================== Test  Result       Flag       Units  Drug Present and  Declared for Prescription Verification   Tramadol                       [phone]       EXPECTED   ng/mg creat   O-Desmethyltramadol            3197         EXPECTED   ng/mg creat   N-Desmethyltramadol            7950         EXPECTED   ng/mg creat    Source of tramadol is a prescription medication. O-desmethyltramadol    and N-desmethyltramadol are expected metabolites of tramadol.  Drug Present not Declared for Prescription Verification   Alcohol, Ethyl                 0.041        UNEXPECTED g/dL    Sources of ethyl alcohol include alcoholic beverages or as a    fermentation product of glucose; glucose is present in this specimen.    Interpret result with caution, as the presence of ethyl alcohol is    likely due, at least in part, to fermentation of glucose.  ==================================================================== Test                      Result    Flag   Units      Ref Range   Creatinine              36               mg/dL      >=47 ==================================================================== Declared Medications:  The flagging and interpretation on this report are based on the  following declared medications.  Unexpected results may arise from  inaccuracies in the declared medications.   **Note: The testing scope of this panel includes these medications:   Tramadol (Ultram)   **Note: The testing scope of this panel does not include the  following reported medications:   Albuterol  Apixaban (Eliquis)  Atorvastatin (Lipitor)  Bupropion (Wellbutrin SR)  Calcitriol  Dulaglutide (Trulicity)  Fluticasone (Flonase)  Gabapentin (Neurontin)  Helium  Hydroxyurea (Hydrea)  Insulin (NovoLog)  Loratadine (Claritin)  Meclizine (Antivert)  Oxygen  Paroxetine (Paxil)  Torsemide (Demadex)  Vitamin D2 (Drisdol) ==================================================================== For clinical consultation, please call [phone]. ====================================================================       ROS  Constitutional: Denies any fever or chills Gastrointestinal: No reported hemesis, hematochezia, vomiting, or acute GI distress Musculoskeletal: Left knee pain, decrease ROM  Neurological:  Paresthesias bilateral feet  Medication Review  Dulaglutide, Omnipod DASH Pods (Gen 4), Oxygen-Helium, PARoxetine, Vitamin D (Ergocalciferol), albuterol, apixaban, atorvastatin, buPROPion, calcium carbonate, fluticasone, gabapentin, hydroxyurea, insulin aspart, insulin pump, loratadine, losartan, meclizine, optichamber diamond, torsemide, and traMADol  History Review  Allergy: Ms. Dejarnett is allergic to codeine and ivp dye [iodinated contrast media]. Drug: Ms. Losh  reports no history of drug use. Alcohol:  reports no history of alcohol use. Tobacco:  reports that she has been smoking cigarettes. She started smoking about 49 years ago. She has a 24.2 pack-year smoking history. She has never used smokeless tobacco. Social: Ms. Platas  reports that she has been smoking cigarettes. She started smoking about 49 years ago. She has a 24.2 pack-year smoking history. She has never used smokeless tobacco. She reports that  she does not drink alcohol and does not use drugs. Medical:  has a past medical history of Acute ischemic left MCA stroke (HCC) (01/29/2015), Acute renal failure superimposed on stage 4 chronic kidney disease (HCC) (01/29/2015), Anemia of chronic renal failure, Anxiety, Arthritis, Complication of anesthesia, COPD (chronic obstructive pulmonary disease) (HCC), Depression, Dyspnea, ESRD (end stage renal disease) (HCC), Headache, HFrEF (heart failure with reduced ejection fraction) (HCC), HTN (hypertension), Leukocytosis, Long term current use of anticoagulant, Long term current use of antithrombotics/antiplatelets, NICM (nonischemic cardiomyopathy) (HCC), Obesity, On supplemental oxygen by nasal cannula,  Osteoporosis, Peripheral vascular disease (HCC), and Type 2 diabetes mellitus treated with insulin (HCC). Surgical: Ms. Bradstreet  has a past surgical history that includes Abdominal hysterectomy; Tubal ligation (02/09/1976); Cesarean section (02/09/1976); Knee arthroscopy (Left, 02/09/2003); Ulnar nerve transposition (02/08/2006); Bilateral salpingoophorectomy (02/08/1998); Ankle fracture surgery (Left, 02/09/2000); TEE without cardioversion (N/A, 01/31/2015); Cardiac catheterization (N/A, 03/25/2015); Cardiac catheterization (N/A, 08/05/2015); Esophagogastroduodenoscopy (egd) with propofol (N/A, 09/22/2016); Colonoscopy with propofol (N/A, 09/22/2016); Esophagogastroduodenoscopy (egd) with propofol (N/A, 12/08/2016); Lower Extremity Angiography (Left, 08/12/2021); TEMPORARY DIALYSIS CATHETER (N/A, 08/14/2021); Fracture surgery; Colonoscopy with propofol (N/A, 02/15/2022); Esophagogastroduodenoscopy (N/A, 02/15/2022); Givens capsule study (N/A, 04/19/2022); AV fistula placement (Left, 06/18/2022); Insertion hybrid anteriovenous gortex graft (06/18/2022); Esophagogastroduodenoscopy (egd) with propofol (N/A, 10/12/2022); Givens capsule study (N/A, 10/12/2022); Facial laceration repair (N/A, 12/27/2022); PERIPHERAL VASCULAR THROMBECTOMY (Left, 01/19/2023); and DIALYSIS/PERMA CATHETER INSERTION (N/A, 01/19/2023). Family: family history includes Alcohol abuse in her paternal aunt; Aneurysm in her father; Asthma in her mother; COPD in her brother; Cancer in her maternal grandmother; Diabetes in her brother and mother; Heart disease in her father and mother.  Laboratory Chemistry Profile   Renal Lab Results  Component Value Date   BUN 46 (H) 03/07/2023   CREATININE 4.33 (H) 03/07/2023   LABCREA 3.7 04/22/2022   BCR 15 12/04/2021   GFRAA 33 (L) 04/23/2019   GFRNONAA 11 (L) 03/07/2023    Hepatic Lab Results  Component Value Date   AST 19 03/07/2023   ALT 25 03/07/2023   ALBUMIN 3.8 03/07/2023   ALKPHOS 116  03/07/2023    Electrolytes Lab Results  Component Value Date   NA 137 03/07/2023   K 4.8 03/07/2023   CL 101 03/07/2023   CALCIUM 6.1 (LL) 03/07/2023   MG 1.8 12/28/2022   PHOS 3.2 12/31/2022    Bone No results found for: "VD25OH", "VD125OH2TOT", "WU9811BJ4", "NW2956OZ3", "25OHVITD1", "25OHVITD2", "25OHVITD3", "TESTOFREE", "TESTOSTERONE"  Inflammation (CRP: Acute Phase) (ESR: Chronic Phase) Lab Results  Component Value Date   ESRSEDRATE 23 10/20/2020   LATICACIDVEN 1.4 05/31/2020         Note: Above Lab results reviewed.  Recent Imaging Review  US Venous Img Lower Unilateral Right (DVT) CLINICAL DATA:  Right greater than left lower extremity swelling with tenderness.  EXAM: RIGHT LOWER EXTREMITY VENOUS DOPPLER ULTRASOUND  TECHNIQUE: Gray-scale sonography with compression, as well as color and duplex ultrasound, were performed to evaluate the deep venous system(s) from the level of the common femoral vein through the popliteal and proximal calf veins.  COMPARISON:  06/02/2021  FINDINGS: VENOUS  Normal compressibility of the common femoral, superficial femoral, and popliteal veins, as well as the visualized calf veins. Visualized portions of profunda femoral vein and great saphenous vein unremarkable. No filling defects to suggest DVT on grayscale or color Doppler imaging. Doppler waveforms show normal direction of venous flow, normal respiratory plasticity and response to augmentation.  Limited views of the contralateral common femoral vein are unremarkable.  OTHER  None.  Limitations: none  IMPRESSION: No right lower extremity DVT.  Electronically Signed   By: Acquanetta Belling M.D.   On: 05/11/2023 13:55 Note: Reviewed        Physical Exam  General appearance: alert, cooperative, oriented, and Well nourished, well developed, and well hydrated. In no apparent acute distress Mental status: Alert, oriented x 3 (person, place, & time)       Respiratory:  No evidence of acute respiratory distress Eyes: PERLA Vitals: BP 116/60   Pulse 75   Temp (!) 97.3 F (36.3 C)   Resp 16   Ht 5\' 4"  (1.626 m)   Wt 205 lb (93 kg)   SpO2 97%   BMI 35.19 kg/m  BMI: Estimated body mass index is 35.19 kg/m as calculated from the following:   Height as of this encounter: 5\' 4"  (1.626 m).   Weight as of this encounter: 205 lb (93 kg). Ideal: Ideal body weight: 54.7 kg (120 lb 9.5 oz) Adjusted ideal body weight: 70 kg (154 lb 5.7 oz)  Assessment   Diagnosis Status  1. Chronic pain of left knee   2. Primary osteoarthritis of left knee   3. Chronic painful diabetic neuropathy (HCC)   4. Chronic pain syndrome   5. Lumbar foraminal stenosis (RIGHT L1)   6. Compression fracture of body of thoracic vertebra (HCC)   7. Compression fracture of L1 vertebra, sequela    Stable Stable Having a Flare-up   Plan of Care  Problem-specific:  Assessment and Plan The patient indicates doing well with the current medication regimen. The patient is interested in receiving Qutenza again for chronic painful diabetic neuropathy of lower extremity which has previously helped improve her functional activity. She states that her left knee is also doing well after left genicular nerve nerve block on 03/09/2023.  Continue to monitor repeat as needed, consider genicular nerve RFA.  Ms. CARLOS QUACKENBUSH has a current medication list which includes the following long-term medication(s): albuterol, apixaban, atorvastatin, bupropion, fluticasone, insulin pump, loratadine, paroxetine, optichamber diamond, and torsemide.  Pharmacotherapy (Medications Ordered): Meds ordered this encounter  Medications   traMADol (ULTRAM) 50 MG tablet    Sig: Take 1 tablet (50 mg total) by mouth 2 (two) times daily as needed.    Dispense:  30 tablet    Refill:  2   Orders:  Orders Placed This Encounter  Procedures   NEUROLYSIS    Please order Qutenza patches    Standing Status:   Future     Expiration Date:   08/17/2023    Where will this procedure be performed?:   [facility]   Follow-up plan:   Return in about 4 weeks (around 06/15/2023) for Qutenza, Dr. Cherylann Ratel .    Recent Visits Date Type Provider Dept  03/09/23 Procedure visit Edward Jolly, MD [facility]  02/21/23 Office Visit Edward Jolly, MD [facility]  Showing recent visits within past 90 days and meeting all other requirements Today's Visits Date Type Provider Dept  05/18/23 Office Visit Edward Jolly, MD [facility]  Showing today's visits and meeting all other requirements Future Appointments Date Type Provider Dept  06/15/23 Appointment Edward Jolly, MD [facility]  08/10/23 Appointment Bettey Costa, NP [facility]  Showing future appointments within next 90 days and meeting all other requirements  I discussed the assessment and treatment plan with the patient. The patient was provided an opportunity to ask questions and all were answered. The patient  agreed with the plan and demonstrated an understanding of the instructions.  Patient advised to call back or seek an in-person evaluation if the symptoms or condition worsens.  Duration of encounter: 30 minutes.  Total time on encounter, as per AMA guidelines included both the face-to-face and non-face-to-face time personally spent by the physician and/or other qualified health care professional(s) on the day of the encounter (includes time in activities that require the physician or other qualified health care professional and does not include time in activities normally performed by clinical staff). Physician's time may include the following activities when performed: Preparing to see the patient (e.g., pre-charting review of records, searching for previously ordered imaging, lab work, and nerve conduction tests) Review of prior analgesic pharmacotherapies. Reviewing PMP Interpreting ordered tests  (e.g., lab work, imaging, nerve conduction tests) Performing post-procedure evaluations, including interpretation of diagnostic procedures Obtaining and/or reviewing separately obtained history Performing a medically appropriate examination and/or evaluation Counseling and educating the patient/family/caregiver Ordering medications, tests, or procedures Referring and communicating with other health care professionals (when not separately reported) Documenting clinical information in the electronic or other health record Independently interpreting results (not separately reported) and communicating results to the patient/ family/caregiver Care coordination (not separately reported)  Note by: Edward Jolly, MD (TTS and AI technology used. I apologize for any typographical errors that were not detected and corrected.) Date: 05/18/2023; Time: 4:16 PM

## 2023-05-18 NOTE — Patient Instructions (Signed)

## 2023-05-18 NOTE — Progress Notes (Signed)
 Nursing Pain Medication Assessment:  Safety precautions to be maintained throughout the outpatient stay will include: orient to surroundings, keep bed in low position, maintain call bell within reach at all times, provide assistance with transfer out of bed and ambulation.  Medication Inspection Compliance: Pill count conducted under aseptic conditions, in front of the patient. Neither the pills nor the bottle was removed from the patient's sight at any time. Once count was completed pills were immediately returned to the patient in their original bottle.  Medication: Tramadol (Ultram) Pill/Patch Count:  0 of 60 pills remain Pill/Patch Appearance: Markings consistent with prescribed medication Bottle Appearance: Standard pharmacy container. Clearly labeled. Filled Date: 3 / 67 / 2025 Last Medication intake:  Ran out of medicine more than 48 hours ago

## 2023-05-19 ENCOUNTER — Other Ambulatory Visit: Payer: Self-pay | Admitting: Family Medicine

## 2023-05-19 DIAGNOSIS — F3341 Major depressive disorder, recurrent, in partial remission: Secondary | ICD-10-CM

## 2023-05-19 NOTE — Telephone Encounter (Unsigned)
 Copied from CRM [id number]. Topic: Clinical - Medication Refill >> May 19, 2023 10:40 AM Jocelyn Wells wrote: Most Recent Primary Care Visit:  Provider: Sherlyn Hay  Department: [facility]  Visit Type: OFFICE VISIT  Date: 05/11/2023  Medication: PARoxetine (PAXIL) 40 MG tablet  Has the patient contacted their pharmacy? No (Agent: If no, request that the patient contact the pharmacy for the refill. If patient does not wish to contact the pharmacy document the reason why and proceed with request.) (Agent: If yes, when and what did the pharmacy advise?)  Is this the correct pharmacy for this prescription? Yes If no, delete pharmacy and type the correct one.  This is the patient's preferred pharmacy:  [facility] [address], [place] - [address] [place] [place] [address] Phone: [phone] Fax: [phone]   Has the prescription been filled recently? Yes  Is the patient out of the medication? No  Has the patient been seen for an appointment in the last year OR does the patient have an upcoming appointment? Yes  Can we respond through MyChart? No  Agent: Please be advised that Rx refills may take up to 3 business days. We ask that you follow-up with your pharmacy.

## 2023-05-20 DIAGNOSIS — N2581 Secondary hyperparathyroidism of renal origin: Secondary | ICD-10-CM | POA: Diagnosis not present

## 2023-05-20 DIAGNOSIS — D509 Iron deficiency anemia, unspecified: Secondary | ICD-10-CM | POA: Diagnosis not present

## 2023-05-20 DIAGNOSIS — N250 Renal osteodystrophy: Secondary | ICD-10-CM | POA: Diagnosis not present

## 2023-05-20 DIAGNOSIS — Z992 Dependence on renal dialysis: Secondary | ICD-10-CM | POA: Diagnosis not present

## 2023-05-20 DIAGNOSIS — N186 End stage renal disease: Secondary | ICD-10-CM | POA: Diagnosis not present

## 2023-05-20 DIAGNOSIS — D631 Anemia in chronic kidney disease: Secondary | ICD-10-CM | POA: Diagnosis not present

## 2023-05-20 NOTE — Telephone Encounter (Signed)
 Too soon for refill, last refill 03/08/23 for 90 and 1 refill.  Requested Prescriptions  Pending Prescriptions Disp Refills   PARoxetine (PAXIL) 40 MG tablet 90 tablet 1    Sig: Take 1 tablet (40 mg total) by mouth daily.     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

## 2023-05-21 ENCOUNTER — Encounter (INDEPENDENT_AMBULATORY_CARE_PROVIDER_SITE_OTHER): Payer: Self-pay | Admitting: Vascular Surgery

## 2023-05-24 DIAGNOSIS — Z992 Dependence on renal dialysis: Secondary | ICD-10-CM | POA: Diagnosis not present

## 2023-05-24 DIAGNOSIS — D509 Iron deficiency anemia, unspecified: Secondary | ICD-10-CM | POA: Diagnosis not present

## 2023-05-24 DIAGNOSIS — N2581 Secondary hyperparathyroidism of renal origin: Secondary | ICD-10-CM | POA: Diagnosis not present

## 2023-05-24 DIAGNOSIS — N250 Renal osteodystrophy: Secondary | ICD-10-CM | POA: Diagnosis not present

## 2023-05-24 DIAGNOSIS — D631 Anemia in chronic kidney disease: Secondary | ICD-10-CM | POA: Diagnosis not present

## 2023-05-24 DIAGNOSIS — N186 End stage renal disease: Secondary | ICD-10-CM | POA: Diagnosis not present

## 2023-05-25 LAB — VAS US ABI WITH/WO TBI
Left ABI: 1.24
Right ABI: 1.08

## 2023-05-26 DIAGNOSIS — D631 Anemia in chronic kidney disease: Secondary | ICD-10-CM | POA: Diagnosis not present

## 2023-05-26 DIAGNOSIS — N250 Renal osteodystrophy: Secondary | ICD-10-CM | POA: Diagnosis not present

## 2023-05-26 DIAGNOSIS — Z992 Dependence on renal dialysis: Secondary | ICD-10-CM | POA: Diagnosis not present

## 2023-05-26 DIAGNOSIS — N2581 Secondary hyperparathyroidism of renal origin: Secondary | ICD-10-CM | POA: Diagnosis not present

## 2023-05-26 DIAGNOSIS — N186 End stage renal disease: Secondary | ICD-10-CM | POA: Diagnosis not present

## 2023-05-26 DIAGNOSIS — D509 Iron deficiency anemia, unspecified: Secondary | ICD-10-CM | POA: Diagnosis not present

## 2023-05-28 DIAGNOSIS — D509 Iron deficiency anemia, unspecified: Secondary | ICD-10-CM | POA: Diagnosis not present

## 2023-05-28 DIAGNOSIS — N250 Renal osteodystrophy: Secondary | ICD-10-CM | POA: Diagnosis not present

## 2023-05-28 DIAGNOSIS — D631 Anemia in chronic kidney disease: Secondary | ICD-10-CM | POA: Diagnosis not present

## 2023-05-28 DIAGNOSIS — N2581 Secondary hyperparathyroidism of renal origin: Secondary | ICD-10-CM | POA: Diagnosis not present

## 2023-05-28 DIAGNOSIS — Z992 Dependence on renal dialysis: Secondary | ICD-10-CM | POA: Diagnosis not present

## 2023-05-28 DIAGNOSIS — N186 End stage renal disease: Secondary | ICD-10-CM | POA: Diagnosis not present

## 2023-05-31 DIAGNOSIS — D509 Iron deficiency anemia, unspecified: Secondary | ICD-10-CM | POA: Diagnosis not present

## 2023-05-31 DIAGNOSIS — N250 Renal osteodystrophy: Secondary | ICD-10-CM | POA: Diagnosis not present

## 2023-05-31 DIAGNOSIS — N2581 Secondary hyperparathyroidism of renal origin: Secondary | ICD-10-CM | POA: Diagnosis not present

## 2023-05-31 DIAGNOSIS — D631 Anemia in chronic kidney disease: Secondary | ICD-10-CM | POA: Diagnosis not present

## 2023-05-31 DIAGNOSIS — Z992 Dependence on renal dialysis: Secondary | ICD-10-CM | POA: Diagnosis not present

## 2023-05-31 DIAGNOSIS — N186 End stage renal disease: Secondary | ICD-10-CM | POA: Diagnosis not present

## 2023-06-01 ENCOUNTER — Ambulatory Visit: Admitting: Adult Health

## 2023-06-01 ENCOUNTER — Encounter: Payer: Self-pay | Admitting: Adult Health

## 2023-06-01 DIAGNOSIS — F17210 Nicotine dependence, cigarettes, uncomplicated: Secondary | ICD-10-CM | POA: Diagnosis not present

## 2023-06-01 NOTE — Progress Notes (Signed)
  Virtual Visit via Telephone Note  I connected with Jocelyn Wells , 06/01/23 10:29 AM by a telemedicine application and verified that I am speaking with the correct person using two identifiers.  Location: Patient: home Provider: home   I discussed the limitations of evaluation and management by telemedicine and the availability of in person appointments. The patient expressed understanding and agreed to proceed.   Shared Decision Making Visit Lung Cancer Screening Program [id number])   Eligibility: 68 y.o. Pack Years Smoking History Calculation = 147 pack years  (# packs/per year x # years smoked) Recent History of coughing up blood  no Unexplained weight loss? no ( >Than 15 pounds within the last 6 months ) Prior History Lung / other cancer no (Diagnosis within the last 5 years already requiring surveillance chest CT Scans). Smoking Status Current Smoker  Visit Components: Discussion included one or more decision making aids. YES Discussion included risk/benefits of screening. YES Discussion included potential follow up diagnostic testing for abnormal scans. YES Discussion included meaning and risk of over diagnosis. YES Discussion included meaning and risk of False Positives. YES Discussion included meaning of total radiation exposure. YES  Counseling Included: Importance of adherence to annual lung cancer LDCT screening. YES Impact of comorbidities on ability to participate in the program. YES Ability and willingness to under diagnostic treatment. YES  Smoking Cessation Counseling: Current Smokers:  Discussed importance of smoking cessation. yes Information about tobacco cessation classes and interventions provided to patient. yes Patient provided with "ticket" for LDCT Scan. yes Symptomatic Patient. NO Diagnosis Code: Tobacco Use Z72.0 Asymptomatic Patient yes  Counseling (Intermediate counseling: > three minutes counseling) N5621 (CT Chest Lung Cancer Screening Low  Dose W/O CM) [id number]  Z12.2-Screening of respiratory organs Z87.891-Personal history of nicotine dependence   [place] Dose 06/01/23

## 2023-06-01 NOTE — Patient Instructions (Signed)

## 2023-06-03 ENCOUNTER — Ambulatory Visit: Admission: RE | Admit: 2023-06-03 | Source: Ambulatory Visit

## 2023-06-04 DIAGNOSIS — N186 End stage renal disease: Secondary | ICD-10-CM | POA: Diagnosis not present

## 2023-06-04 DIAGNOSIS — Z992 Dependence on renal dialysis: Secondary | ICD-10-CM | POA: Diagnosis not present

## 2023-06-04 DIAGNOSIS — D509 Iron deficiency anemia, unspecified: Secondary | ICD-10-CM | POA: Diagnosis not present

## 2023-06-04 DIAGNOSIS — N2581 Secondary hyperparathyroidism of renal origin: Secondary | ICD-10-CM | POA: Diagnosis not present

## 2023-06-04 DIAGNOSIS — N250 Renal osteodystrophy: Secondary | ICD-10-CM | POA: Diagnosis not present

## 2023-06-04 DIAGNOSIS — D631 Anemia in chronic kidney disease: Secondary | ICD-10-CM | POA: Diagnosis not present

## 2023-06-06 ENCOUNTER — Other Ambulatory Visit: Payer: Self-pay | Admitting: Family

## 2023-06-07 ENCOUNTER — Telehealth (INDEPENDENT_AMBULATORY_CARE_PROVIDER_SITE_OTHER): Payer: Self-pay

## 2023-06-07 NOTE — Telephone Encounter (Signed)
 A fax was received from [place] at [facility] that the patient needed a declot. I reached out to the patient and a message was left. I then called Lavonia Powers at [facility] and per him the patient will be sent to another place for a declot as I don't have any place to put the patient until Thursday.

## 2023-06-08 ENCOUNTER — Inpatient Hospital Stay: Payer: Medicare Other

## 2023-06-08 ENCOUNTER — Ambulatory Visit: Admission: RE | Admit: 2023-06-08 | Source: Ambulatory Visit

## 2023-06-08 ENCOUNTER — Inpatient Hospital Stay: Payer: Medicare Other | Admitting: Oncology

## 2023-06-08 DIAGNOSIS — N186 End stage renal disease: Secondary | ICD-10-CM | POA: Diagnosis not present

## 2023-06-08 DIAGNOSIS — I871 Compression of vein: Secondary | ICD-10-CM | POA: Diagnosis not present

## 2023-06-08 DIAGNOSIS — Z992 Dependence on renal dialysis: Secondary | ICD-10-CM | POA: Diagnosis not present

## 2023-06-08 DIAGNOSIS — I509 Heart failure, unspecified: Secondary | ICD-10-CM | POA: Diagnosis not present

## 2023-06-08 DIAGNOSIS — M1030 Gout due to renal impairment, unspecified site: Secondary | ICD-10-CM | POA: Diagnosis not present

## 2023-06-08 DIAGNOSIS — T82858A Stenosis of vascular prosthetic devices, implants and grafts, initial encounter: Secondary | ICD-10-CM | POA: Diagnosis not present

## 2023-06-08 DIAGNOSIS — E1122 Type 2 diabetes mellitus with diabetic chronic kidney disease: Secondary | ICD-10-CM | POA: Diagnosis not present

## 2023-06-08 DIAGNOSIS — T82898A Other specified complication of vascular prosthetic devices, implants and grafts, initial encounter: Secondary | ICD-10-CM | POA: Diagnosis not present

## 2023-06-08 DIAGNOSIS — J449 Chronic obstructive pulmonary disease, unspecified: Secondary | ICD-10-CM | POA: Diagnosis not present

## 2023-06-08 DIAGNOSIS — D631 Anemia in chronic kidney disease: Secondary | ICD-10-CM | POA: Diagnosis not present

## 2023-06-08 DIAGNOSIS — I132 Hypertensive heart and chronic kidney disease with heart failure and with stage 5 chronic kidney disease, or end stage renal disease: Secondary | ICD-10-CM | POA: Diagnosis not present

## 2023-06-08 DIAGNOSIS — I771 Stricture of artery: Secondary | ICD-10-CM | POA: Diagnosis not present

## 2023-06-09 DIAGNOSIS — Z992 Dependence on renal dialysis: Secondary | ICD-10-CM | POA: Diagnosis not present

## 2023-06-09 DIAGNOSIS — N2581 Secondary hyperparathyroidism of renal origin: Secondary | ICD-10-CM | POA: Diagnosis not present

## 2023-06-09 DIAGNOSIS — D631 Anemia in chronic kidney disease: Secondary | ICD-10-CM | POA: Diagnosis not present

## 2023-06-09 DIAGNOSIS — N186 End stage renal disease: Secondary | ICD-10-CM | POA: Diagnosis not present

## 2023-06-09 DIAGNOSIS — D509 Iron deficiency anemia, unspecified: Secondary | ICD-10-CM | POA: Diagnosis not present

## 2023-06-09 DIAGNOSIS — N250 Renal osteodystrophy: Secondary | ICD-10-CM | POA: Diagnosis not present

## 2023-06-10 DIAGNOSIS — E559 Vitamin D deficiency, unspecified: Secondary | ICD-10-CM | POA: Diagnosis not present

## 2023-06-10 DIAGNOSIS — M810 Age-related osteoporosis without current pathological fracture: Secondary | ICD-10-CM | POA: Diagnosis not present

## 2023-06-11 DIAGNOSIS — D631 Anemia in chronic kidney disease: Secondary | ICD-10-CM | POA: Diagnosis not present

## 2023-06-11 DIAGNOSIS — N2581 Secondary hyperparathyroidism of renal origin: Secondary | ICD-10-CM | POA: Diagnosis not present

## 2023-06-11 DIAGNOSIS — D509 Iron deficiency anemia, unspecified: Secondary | ICD-10-CM | POA: Diagnosis not present

## 2023-06-11 DIAGNOSIS — N186 End stage renal disease: Secondary | ICD-10-CM | POA: Diagnosis not present

## 2023-06-11 DIAGNOSIS — Z992 Dependence on renal dialysis: Secondary | ICD-10-CM | POA: Diagnosis not present

## 2023-06-11 DIAGNOSIS — N250 Renal osteodystrophy: Secondary | ICD-10-CM | POA: Diagnosis not present

## 2023-06-13 ENCOUNTER — Ambulatory Visit
Admission: RE | Admit: 2023-06-13 | Discharge: 2023-06-13 | Disposition: A | Discharge status: Home / Self Care | Source: Ambulatory Visit | Attending: Acute Care | Admitting: Acute Care

## 2023-06-13 DIAGNOSIS — Z9641 Presence of insulin pump (external) (internal): Secondary | ICD-10-CM | POA: Diagnosis not present

## 2023-06-13 DIAGNOSIS — F17210 Nicotine dependence, cigarettes, uncomplicated: Secondary | ICD-10-CM | POA: Insufficient documentation

## 2023-06-13 DIAGNOSIS — Z122 Encounter for screening for malignant neoplasm of respiratory organs: Secondary | ICD-10-CM | POA: Diagnosis not present

## 2023-06-13 DIAGNOSIS — E1165 Type 2 diabetes mellitus with hyperglycemia: Secondary | ICD-10-CM | POA: Diagnosis not present

## 2023-06-13 DIAGNOSIS — Z87891 Personal history of nicotine dependence: Secondary | ICD-10-CM | POA: Insufficient documentation

## 2023-06-13 DIAGNOSIS — E1159 Type 2 diabetes mellitus with other circulatory complications: Secondary | ICD-10-CM | POA: Diagnosis not present

## 2023-06-13 DIAGNOSIS — E1142 Type 2 diabetes mellitus with diabetic polyneuropathy: Secondary | ICD-10-CM | POA: Diagnosis not present

## 2023-06-13 DIAGNOSIS — N186 End stage renal disease: Secondary | ICD-10-CM | POA: Diagnosis not present

## 2023-06-13 DIAGNOSIS — E1121 Type 2 diabetes mellitus with diabetic nephropathy: Secondary | ICD-10-CM | POA: Diagnosis not present

## 2023-06-13 DIAGNOSIS — E113393 Type 2 diabetes mellitus with moderate nonproliferative diabetic retinopathy without macular edema, bilateral: Secondary | ICD-10-CM | POA: Diagnosis not present

## 2023-06-13 DIAGNOSIS — R809 Proteinuria, unspecified: Secondary | ICD-10-CM | POA: Diagnosis not present

## 2023-06-13 DIAGNOSIS — E1122 Type 2 diabetes mellitus with diabetic chronic kidney disease: Secondary | ICD-10-CM | POA: Diagnosis not present

## 2023-06-13 DIAGNOSIS — E1129 Type 2 diabetes mellitus with other diabetic kidney complication: Secondary | ICD-10-CM | POA: Diagnosis not present

## 2023-06-13 DIAGNOSIS — I10 Essential (primary) hypertension: Secondary | ICD-10-CM | POA: Diagnosis not present

## 2023-06-13 DIAGNOSIS — Z794 Long term (current) use of insulin: Secondary | ICD-10-CM | POA: Diagnosis not present

## 2023-06-14 DIAGNOSIS — D631 Anemia in chronic kidney disease: Secondary | ICD-10-CM | POA: Diagnosis not present

## 2023-06-14 DIAGNOSIS — N186 End stage renal disease: Secondary | ICD-10-CM | POA: Diagnosis not present

## 2023-06-14 DIAGNOSIS — D509 Iron deficiency anemia, unspecified: Secondary | ICD-10-CM | POA: Diagnosis not present

## 2023-06-14 DIAGNOSIS — Z992 Dependence on renal dialysis: Secondary | ICD-10-CM | POA: Diagnosis not present

## 2023-06-14 DIAGNOSIS — N2581 Secondary hyperparathyroidism of renal origin: Secondary | ICD-10-CM | POA: Diagnosis not present

## 2023-06-14 DIAGNOSIS — N250 Renal osteodystrophy: Secondary | ICD-10-CM | POA: Diagnosis not present

## 2023-06-15 ENCOUNTER — Ambulatory Visit
Attending: Student in an Organized Health Care Education/Training Program | Admitting: Student in an Organized Health Care Education/Training Program

## 2023-06-15 ENCOUNTER — Encounter: Payer: Self-pay | Admitting: Student in an Organized Health Care Education/Training Program

## 2023-06-15 VITALS — BP 129/63 | HR 73 | Temp 97.1°F | Resp 18 | Ht 64.0 in | Wt 200.0 lb

## 2023-06-15 DIAGNOSIS — M1712 Unilateral primary osteoarthritis, left knee: Secondary | ICD-10-CM | POA: Insufficient documentation

## 2023-06-15 DIAGNOSIS — M25562 Pain in left knee: Secondary | ICD-10-CM | POA: Diagnosis not present

## 2023-06-15 DIAGNOSIS — G8929 Other chronic pain: Secondary | ICD-10-CM | POA: Diagnosis not present

## 2023-06-15 DIAGNOSIS — E1140 Type 2 diabetes mellitus with diabetic neuropathy, unspecified: Secondary | ICD-10-CM

## 2023-06-15 MED ORDER — CAPSAICIN-CLEANSING GEL 8 % EX KIT
4.0000 | PACK | Freq: Once | CUTANEOUS | Status: AC
Start: 2023-06-15 — End: 2023-06-15
  Administered 2023-06-15: 4 via TOPICAL
  Filled 2023-06-15: qty 4

## 2023-06-15 NOTE — Progress Notes (Signed)
 Safety precautions to be maintained throughout the outpatient stay will include: orient to surroundings, keep bed in low position, maintain call bell within reach at all times, provide assistance with transfer out of bed and ambulation.

## 2023-06-15 NOTE — Progress Notes (Signed)
 PROVIDER NOTE: Interpretation of information contained herein should be left to medically-trained personnel. Specific patient instructions are provided elsewhere under "Patient Instructions" section of medical record. This document was created in part using STT-dictation technology, any transcriptional errors that may result from this process are unintentional.  Patient: Jocelyn Wells Type: Established DOB: 04/20/1955 MRN: [id number] PCP: Carlean Charter, DO  Service: Procedure DOS: 06/15/2023 Setting: Ambulatory Location: Ambulatory outpatient facility Delivery: Face-to-face Provider: Cephus Collin, MD Specialty: Interventional Pain Management Specialty designation: 09 Location: Outpatient facility Ref. Prov.: Pardue, Asencion Blacksmith, DO       Interventional Therapy   Interventional Treatment:           Type: Qutenza  Neurolysis #3  Laterality:  Bilateral Area treated: Feet Imaging Guidance: None Anesthesia/analgesia/anxiolysis/sedation: None required Medication (Right): Qutenza  (capsaicin  8%) topical system Medication (Left): Qutenza  (capsaicin  8%) topical system Date: 06/15/2023 Performed by: Cephus Collin, MD Rationale (medical necessity): procedure needed and proper for the treatment of Jocelyn Wells's medical symptoms and needs. Indication: Painful diabetic peripheral neuralgia (DPN) (ICD-10-CM:E11.40) severe enough to impact quality of life or function. 1. Chronic painful diabetic neuropathy (HCC)    NAS-11 Pain score:   Pre-procedure: 0-No pain/10   Post-procedure: 0-No pain/10     Position / Prep / Materials:  Position: Supine  Materials: Qutenza  Kit  Pre-op  H&P Assessment:  Jocelyn Wells is a 68 y.o. (year old), female patient, seen today for interventional treatment. She  has a past surgical history that includes Abdominal hysterectomy; Tubal ligation (02/09/1976); Cesarean section (02/09/1976); Knee arthroscopy (Left, 02/09/2003); Ulnar nerve transposition (02/08/2006); Bilateral  salpingoophorectomy (02/08/1998); Ankle fracture surgery (Left, 02/09/2000); TEE without cardioversion (N/A, 01/31/2015); Cardiac catheterization (N/A, 03/25/2015); Cardiac catheterization (N/A, 08/05/2015); Esophagogastroduodenoscopy (egd) with propofol  (N/A, 09/22/2016); Colonoscopy with propofol  (N/A, 09/22/2016); Esophagogastroduodenoscopy (egd) with propofol  (N/A, 12/08/2016); Lower Extremity Angiography (Left, 08/12/2021); TEMPORARY DIALYSIS CATHETER (N/A, 08/14/2021); Fracture surgery; Colonoscopy with propofol  (N/A, 02/15/2022); Esophagogastroduodenoscopy (N/A, 02/15/2022); Givens capsule study (N/A, 04/19/2022); AV fistula placement (Left, 06/18/2022); Insertion hybrid anteriovenous gortex graft (06/18/2022); Esophagogastroduodenoscopy (egd) with propofol  (N/A, 10/12/2022); Givens capsule study (N/A, 10/12/2022); Facial laceration repair (N/A, 12/27/2022); PERIPHERAL VASCULAR THROMBECTOMY (Left, 01/19/2023); and DIALYSIS/PERMA CATHETER INSERTION (N/A, 01/19/2023). Jocelyn Wells has a current medication list which includes the following prescription(s): albuterol , apixaban , atorvastatin , bupropion , calcium  carbonate, trulicity , fluticasone , gabapentin , hydroxyurea , insulin  aspart, omnipod dash pods (gen 4), insulin  pump, loratadine , losartan , meclizine , oxygen -helium, paroxetine , optichamber diamond , torsemide , tramadol , and vitamin d  (ergocalciferol ), and the following Facility-Administered Medications: capsaicin  topical system. Her primarily concern today is the feet pain (Bilateral )  Initial Vital Signs:  Pulse/HCG Rate:    Temp:   Resp:   BP:   SpO2:    BMI: Estimated body mass index is 34.33 kg/m as calculated from the following:   Height as of this encounter: 5\' 4"  (1.626 m).   Weight as of this encounter: 200 lb (90.7 kg).  Risk Assessment: Allergies: Reviewed. She is allergic to codeine and ivp dye [iodinated contrast media].  Allergy Precautions: None required Coagulopathies: Reviewed. None  identified.  Blood-thinner therapy: None at this time Active Infection(s): Reviewed. None identified. Jocelyn Wells is afebrile  Site Confirmation: Jocelyn Wells was asked to confirm the procedure and laterality before marking the site Procedure checklist: Completed Consent: Before the procedure and under the influence of no sedative(s), amnesic(s), or anxiolytics, the patient was informed of the treatment options, risks and possible complications. To fulfill our ethical and legal obligations, as recommended by the American Medical Association's Code of Ethics, I have  informed the patient of my clinical impression; the nature and purpose of the treatment or procedure; the risks, benefits, and possible complications of the intervention; the alternatives, including doing nothing; the risk(s) and benefit(s) of the alternative treatment(s) or procedure(s); and the risk(s) and benefit(s) of doing nothing. The patient was provided information about the general risks and possible complications associated with the procedure. These may include, but are not limited to: failure to achieve desired goals, infection, bleeding, organ or nerve damage, allergic reactions, paralysis, and death. In addition, the patient was informed of those risks and complications associated to the procedure, such as failure to decrease pain; infection; bleeding; organ or nerve damage with subsequent damage to sensory, motor, and/or autonomic systems, resulting in permanent pain, numbness, and/or weakness of one or several areas of the body; allergic reactions; (i.e.: anaphylactic reaction); and/or death. Furthermore, the patient was informed of those risks and complications associated with the medications. These include, but are not limited to: allergic reactions (i.e.: anaphylactic or anaphylactoid reaction(s)); adrenal axis suppression; blood sugar elevation that in diabetics may result in ketoacidosis or comma; water retention that in  patients with history of congestive heart failure may result in shortness of breath, pulmonary edema, and decompensation with resultant heart failure; weight gain; swelling or edema; medication-induced neural toxicity; particulate matter embolism and blood vessel occlusion with resultant organ, and/or nervous system infarction; and/or aseptic necrosis of one or more joints. Finally, the patient was informed that Medicine is not an exact science; therefore, there is also the possibility of unforeseen or unpredictable risks and/or possible complications that may result in a catastrophic outcome. The patient indicated having understood very clearly. We have given the patient no guarantees and we have made no promises. Enough time was given to the patient to ask questions, all of which were answered to the patient's satisfaction. Ms. Horger has indicated that she wanted to continue with the procedure. Attestation: I, the ordering provider, attest that I have discussed with the patient the benefits, risks, side-effects, alternatives, likelihood of achieving goals, and potential problems during recovery for the procedure that I have provided informed consent. Date  Time: 06/15/2023 10:41 AM  Pre-Procedure Preparation:  Monitoring: As per clinic protocol. Respiration, ETCO2, SpO2, BP, heart rate and rhythm monitor placed and checked for adequate function Safety Precautions: Patient was assessed for positional comfort and pressure points before starting the procedure. Time-out: I initiated and conducted the "Time-out" before starting the procedure, as per protocol. The patient was asked to participate by confirming the accuracy of the "Time Out" information. Verification of the correct person, site, and procedure were performed and confirmed by me, the nursing staff, and the patient. "Time-out" conducted as per Joint Commission's Universal Protocol (UP.01.01.01). Time:   Start Time:   hrs.  Description/Narrative  of Procedure:          Region: Distal lower extremity Target Area: Sensory peripheral nerves affected by diabetic peripheral neuropathy Site: Feet Approach: Percutaneous  No./Series: Not applicable  Type: Percutaneous  Purpose: Therapeutic  Region: Distal lower extremities  Start Time:   hrs.  Description of the Procedure: Protocol guidelines were followed. The patient was assisted into a comfortable position.  Informed consent was obtained in the patient monitored in the usual manner.  All questions were answered prior to the procedure.  They Qutenza  patches were applied to the affected area and then covered with the wrap.  The Patient was kept under observation until the treatment was completed.  The patches were  removed and the treated area was inspected.  Vitals:   06/15/23 1046  Weight: 200 lb (90.7 kg)  Height: 5\' 4"  (1.626 m)     End Time:   hrs.  Imaging Guidance:          Type of Imaging Technique: None used Indication(s): N/A Exposure Time: No patient exposure Contrast: None used. Fluoroscopic Guidance: N/A Ultrasound Guidance: N/A Interpretation: N/A  Post-operative Assessment:  Post-procedure Vital Signs:  Pulse/HCG Rate:    Temp:   Resp:   BP:   SpO2:    EBL: None  Complications: No immediate post-treatment complications observed by team, or reported by patient.  Note: The patient tolerated the entire procedure well. A repeat set of vitals were taken after the procedure and the patient was kept under observation following institutional policy, for this type of procedure. Post-procedural neurological assessment was performed, showing return to baseline, prior to discharge. The patient was provided with post-procedure discharge instructions, including a section on how to identify potential problems. Should any problems arise concerning this procedure, the patient was given instructions to immediately contact us , at any time, without hesitation. In any case, we  plan to contact the patient by telephone for a follow-up status report regarding this interventional procedure.  Comments:  No additional relevant information.  Plan of Care (POC)  Orders:  No orders of the defined types were placed in this encounter.  Chronic Opioid Analgesic:  Tramadol  50 mg twice daily as needed, quantity 60/month    Medications ordered for procedure: Meds ordered this encounter  Medications   capsaicin  topical system 8 % patch 4 patch   Medications administered: Justeen A. Shadix had no medications administered during this visit.  See the medical record for exact dosing, route, and time of administration.  Follow-up plan:   No follow-ups on file.       Recent Visits Date Type Provider Dept  05/18/23 Office Visit Cephus Collin, MD [facility]  Showing recent visits within past 90 days and meeting all other requirements Today's Visits Date Type Provider Dept  06/15/23 Procedure visit Cephus Collin, MD [facility]  Showing today's visits and meeting all other requirements Future Appointments Date Type Provider Dept  08/10/23 Appointment Patel, Seema K, NP [facility]  Showing future appointments within next 90 days and meeting all other requirements  Disposition: Discharge home  Discharge (Date  Time): 06/15/2023;   hrs.   Primary Care Physician: Carlean Charter, DO Location: [facility] Note by: Cephus Collin, MD (TTS technology used. I apologize for any typographical errors that were not detected and corrected.) Date: 06/15/2023; Time: 10:48 AM  Disclaimer:  Medicine is not an Visual merchandiser. The only guarantee in medicine is that nothing is guaranteed. It is important to note that the decision to proceed with this intervention was based on the information collected from the patient. The Data and conclusions were drawn from the patient's questionnaire, the interview, and the physical examination.  Because the information was provided in large part by the patient, it cannot be guaranteed that it has not been purposely or unconsciously manipulated. Every effort has been made to obtain as much relevant data as possible for this evaluation. It is important to note that the conclusions that lead to this procedure are derived in large part from the available data. Always take into account that the treatment will also be dependent on availability of resources and existing treatment guidelines, considered by other Pain  Management Practitioners as being common knowledge and practice, at the time of the intervention. For Medico-Legal purposes, it is also important to point out that variation in procedural techniques and pharmacological choices are the acceptable norm. The indications, contraindications, technique, and results of the above procedure should only be interpreted and judged by a Board-Certified Interventional Pain Specialist with extensive familiarity and expertise in the same exact procedure and technique.

## 2023-06-16 ENCOUNTER — Telehealth: Payer: Self-pay

## 2023-06-16 NOTE — Telephone Encounter (Signed)
 No issues post-qutenza  treatment.

## 2023-06-20 ENCOUNTER — Telehealth (INDEPENDENT_AMBULATORY_CARE_PROVIDER_SITE_OTHER): Payer: Self-pay

## 2023-06-20 DIAGNOSIS — D631 Anemia in chronic kidney disease: Secondary | ICD-10-CM | POA: Diagnosis not present

## 2023-06-20 DIAGNOSIS — M1030 Gout due to renal impairment, unspecified site: Secondary | ICD-10-CM | POA: Diagnosis not present

## 2023-06-20 DIAGNOSIS — Z720 Tobacco use: Secondary | ICD-10-CM | POA: Diagnosis not present

## 2023-06-20 DIAGNOSIS — J449 Chronic obstructive pulmonary disease, unspecified: Secondary | ICD-10-CM | POA: Diagnosis not present

## 2023-06-20 DIAGNOSIS — E1122 Type 2 diabetes mellitus with diabetic chronic kidney disease: Secondary | ICD-10-CM | POA: Diagnosis not present

## 2023-06-20 DIAGNOSIS — Z794 Long term (current) use of insulin: Secondary | ICD-10-CM | POA: Diagnosis not present

## 2023-06-20 DIAGNOSIS — N186 End stage renal disease: Secondary | ICD-10-CM | POA: Diagnosis not present

## 2023-06-20 DIAGNOSIS — Z4901 Encounter for fitting and adjustment of extracorporeal dialysis catheter: Secondary | ICD-10-CM | POA: Diagnosis not present

## 2023-06-20 DIAGNOSIS — I509 Heart failure, unspecified: Secondary | ICD-10-CM | POA: Diagnosis not present

## 2023-06-20 DIAGNOSIS — I132 Hypertensive heart and chronic kidney disease with heart failure and with stage 5 chronic kidney disease, or end stage renal disease: Secondary | ICD-10-CM | POA: Diagnosis not present

## 2023-06-20 DIAGNOSIS — T82868A Thrombosis of vascular prosthetic devices, implants and grafts, initial encounter: Secondary | ICD-10-CM | POA: Diagnosis not present

## 2023-06-20 NOTE — Telephone Encounter (Signed)
 A fax was received from [facility] [address] regarding the patient was clotted and needed to be seen. I contacted Jocelyn Wells to let him know that at this time my earliest appt would be on Wednesday. Jocelyn Wells stated the patient hasn't had dialysis since Thursday so they will try one other place and may have to send the patient to the ED.

## 2023-06-21 DIAGNOSIS — D509 Iron deficiency anemia, unspecified: Secondary | ICD-10-CM | POA: Diagnosis not present

## 2023-06-21 DIAGNOSIS — D631 Anemia in chronic kidney disease: Secondary | ICD-10-CM | POA: Diagnosis not present

## 2023-06-21 DIAGNOSIS — N2581 Secondary hyperparathyroidism of renal origin: Secondary | ICD-10-CM | POA: Diagnosis not present

## 2023-06-21 DIAGNOSIS — N186 End stage renal disease: Secondary | ICD-10-CM | POA: Diagnosis not present

## 2023-06-21 DIAGNOSIS — Z992 Dependence on renal dialysis: Secondary | ICD-10-CM | POA: Diagnosis not present

## 2023-06-21 DIAGNOSIS — N250 Renal osteodystrophy: Secondary | ICD-10-CM | POA: Diagnosis not present

## 2023-06-22 ENCOUNTER — Encounter: Payer: Self-pay | Admitting: Oncology

## 2023-06-22 ENCOUNTER — Inpatient Hospital Stay (HOSPITAL_BASED_OUTPATIENT_CLINIC_OR_DEPARTMENT_OTHER): Admitting: Oncology

## 2023-06-22 ENCOUNTER — Inpatient Hospital Stay: Attending: Oncology

## 2023-06-22 VITALS — BP 128/58 | HR 72 | Temp 97.6°F | Resp 16 | Wt 204.0 lb

## 2023-06-22 DIAGNOSIS — D500 Iron deficiency anemia secondary to blood loss (chronic): Secondary | ICD-10-CM | POA: Diagnosis not present

## 2023-06-22 DIAGNOSIS — N186 End stage renal disease: Secondary | ICD-10-CM | POA: Diagnosis not present

## 2023-06-22 DIAGNOSIS — Z992 Dependence on renal dialysis: Secondary | ICD-10-CM | POA: Diagnosis not present

## 2023-06-22 DIAGNOSIS — Z1589 Genetic susceptibility to other disease: Secondary | ICD-10-CM

## 2023-06-22 DIAGNOSIS — Z86718 Personal history of other venous thrombosis and embolism: Secondary | ICD-10-CM | POA: Insufficient documentation

## 2023-06-22 DIAGNOSIS — Z7901 Long term (current) use of anticoagulants: Secondary | ICD-10-CM | POA: Diagnosis not present

## 2023-06-22 DIAGNOSIS — Z87891 Personal history of nicotine dependence: Secondary | ICD-10-CM | POA: Diagnosis not present

## 2023-06-22 DIAGNOSIS — Z79899 Other long term (current) drug therapy: Secondary | ICD-10-CM | POA: Insufficient documentation

## 2023-06-22 DIAGNOSIS — D473 Essential (hemorrhagic) thrombocythemia: Secondary | ICD-10-CM

## 2023-06-22 DIAGNOSIS — D631 Anemia in chronic kidney disease: Secondary | ICD-10-CM | POA: Insufficient documentation

## 2023-06-22 LAB — COMPREHENSIVE METABOLIC PANEL WITH GFR
ALT: 24 U/L (ref 0–44)
AST: 21 U/L (ref 15–41)
Albumin: 3.5 g/dL (ref 3.5–5.0)
Alkaline Phosphatase: 63 U/L (ref 38–126)
Anion gap: 9 (ref 5–15)
BUN: 31 mg/dL — ABNORMAL HIGH (ref 8–23)
CO2: 25 mmol/L (ref 22–32)
Calcium: 8.6 mg/dL — ABNORMAL LOW (ref 8.9–10.3)
Chloride: 98 mmol/L (ref 98–111)
Creatinine, Ser: 3 mg/dL — ABNORMAL HIGH (ref 0.44–1.00)
GFR, Estimated: 16 mL/min — ABNORMAL LOW (ref >60)
Glucose, Bld: 201 mg/dL — ABNORMAL HIGH (ref 70–99)
Potassium: 4 mmol/L (ref 3.5–5.1)
Sodium: 132 mmol/L — ABNORMAL LOW (ref 135–145)
Total Bilirubin: 0.5 mg/dL (ref 0.0–1.2)
Total Protein: 6.6 g/dL (ref 6.5–8.1)

## 2023-06-22 LAB — CBC WITH DIFFERENTIAL/PLATELET
Abs Immature Granulocytes: 0.03 10*3/uL (ref 0.00–0.07)
Basophils Absolute: 0.1 10*3/uL (ref 0.0–0.1)
Basophils Relative: 1 %
Eosinophils Absolute: 0.2 10*3/uL (ref 0.0–0.5)
Eosinophils Relative: 2 %
HCT: 41.5 % (ref 36.0–46.0)
Hemoglobin: 12.9 g/dL (ref 12.0–15.0)
Immature Granulocytes: 0 %
Lymphocytes Relative: 13 %
Lymphs Abs: 1.1 10*3/uL (ref 0.7–4.0)
MCH: 29.8 pg (ref 26.0–34.0)
MCHC: 31.1 g/dL (ref 30.0–36.0)
MCV: 95.8 fL (ref 80.0–100.0)
Monocytes Absolute: 0.5 10*3/uL (ref 0.1–1.0)
Monocytes Relative: 7 %
Neutro Abs: 6.2 10*3/uL (ref 1.7–7.7)
Neutrophils Relative %: 77 %
Platelets: 346 10*3/uL (ref 150–400)
RBC: 4.33 MIL/uL (ref 3.87–5.11)
RDW: 16.1 % — ABNORMAL HIGH (ref 11.5–15.5)
WBC: 8.1 10*3/uL (ref 4.0–10.5)
nRBC: 0 % (ref 0.0–0.2)

## 2023-06-22 LAB — IRON AND TIBC
Iron: 104 ug/dL (ref 28–170)
Saturation Ratios: 40 % — ABNORMAL HIGH (ref 10.4–31.8)
TIBC: 260 ug/dL (ref 250–450)
UIBC: 156 ug/dL

## 2023-06-22 LAB — FERRITIN: Ferritin: 303 ng/mL (ref 11–307)

## 2023-06-22 NOTE — Progress Notes (Unsigned)
 Patient has had another catheter temporarily placed in her chest due to blockages in her arm. She is feeling very weak and lightheaded.

## 2023-06-23 ENCOUNTER — Encounter: Payer: Self-pay | Admitting: Oncology

## 2023-06-23 DIAGNOSIS — D509 Iron deficiency anemia, unspecified: Secondary | ICD-10-CM | POA: Diagnosis not present

## 2023-06-23 DIAGNOSIS — N186 End stage renal disease: Secondary | ICD-10-CM | POA: Diagnosis not present

## 2023-06-23 DIAGNOSIS — Z992 Dependence on renal dialysis: Secondary | ICD-10-CM | POA: Diagnosis not present

## 2023-06-23 DIAGNOSIS — N2581 Secondary hyperparathyroidism of renal origin: Secondary | ICD-10-CM | POA: Diagnosis not present

## 2023-06-23 DIAGNOSIS — D631 Anemia in chronic kidney disease: Secondary | ICD-10-CM | POA: Diagnosis not present

## 2023-06-23 DIAGNOSIS — N250 Renal osteodystrophy: Secondary | ICD-10-CM | POA: Diagnosis not present

## 2023-06-23 NOTE — Progress Notes (Signed)
 Hematology/Oncology Consult note [facility]  [phone] [phone]  Patient Care Team: Carlean Charter, DO as PCP - General (Family Medicine) Constancia Delton, MD as PCP - Cardiology (Cardiology) Lorelei Rogers Nicolas Barren, MD as Physician Assistant (Endocrinology) Verona Goodwill, MD as Consulting Physician (Cardiology) Hamp Levine, MD (Inactive) as Referring Physician (Psychiatry) Elta Halter, MD as Consulting Physician (Dermatology) Dimmig, Andy Bannister, MD as Referring Physician (Orthopedic Surgery) Schnier, Ninette Basque, MD (Vascular Surgery) Murtis Arthur, OD (Optometry) Marc Senior, MD as Consulting Physician (Pulmonary Disease) Avonne Boettcher, MD as Consulting Physician (Oncology)   Name of the patient: Jocelyn Wells  [id number]  08-Jan-1956   Date of visit: 06/23/23  Diagnosis- 1.  History of iron  deficiency anemia and anemia of chronic kidney disease 2.  JAK2 positive thrombocytosis likely essential thrombocytosis  Chief complaint/ Reason for visit-low up of iron  deficiency anemia and thrombocytosis  Heme/Onc history: patient is a 68 year old female with a past medical history significant for smoking, CKD end-stage on dialysis, hypertension hyperlipidemia, COPD, history of stroke on Eliquis  among other medical problems.She has been referred for leukocytosis.  Most recent CBC from 12/07/2021 showed white cell count of 13.9, H&H of 7.2/24.6 with an MCV of 80.9 and a platelet count of 535.  Differential is mainly shown neutrophilia with occasional monocytosis her white cell count at baseline up until July 2023 was around 11 and it dropped down to 8.2 in August 2023.  She has had some borderline microcytosis over the years.  No recent iron  studies have been checked.  She has not seen GI recently.  Is any vaginal bleeding.  Denies any blood loss in her stool or urine.  Denies any dark melanotic stools.  Denies any consistent use of NSAIDs     Patient had an EGD and colonoscopy in January 2024 by Dr. Baldomero Bone we did not show any evidence of bleeding.  Endoscopy revealed mild chronic gastritis that was negative for H. pylori.She also had a capsule study which did not show any active bleeding    Interval history-patient is currently on Hydrea  500 mg every other day and tolerating it well.  She is on Eliquis  for history of DVT as well.  No recurrent episodes of thrombosis.  She is currently on hemodialysis and has had issues with vascular access.  She has a chest wall dialysis catheter in place presently  ECOG PS- 3 Pain scale- 0 Opioid associated constipation- no  Review of systems- Review of Systems  Constitutional:  Positive for malaise/fatigue. Negative for chills, fever and weight loss.  HENT:  Negative for congestion, ear discharge and nosebleeds.   Eyes:  Negative for blurred vision.  Respiratory:  Negative for cough, hemoptysis, sputum production, shortness of breath and wheezing.   Cardiovascular:  Negative for chest pain, palpitations, orthopnea and claudication.  Gastrointestinal:  Negative for abdominal pain, blood in stool, constipation, diarrhea, heartburn, melena, nausea and vomiting.  Genitourinary:  Negative for dysuria, flank pain, frequency, hematuria and urgency.  Musculoskeletal:  Negative for back pain, joint pain and myalgias.  Skin:  Negative for rash.  Neurological:  Negative for dizziness, tingling, focal weakness, seizures, weakness and headaches.  Endo/Heme/Allergies:  Does not bruise/bleed easily.  Psychiatric/Behavioral:  Negative for depression and suicidal ideas. The patient does not have insomnia.       Allergies  Allergen Reactions   Codeine Nausea And Vomiting   Ivp Dye [Iodinated Contrast Media]     Patients states the IV  Dye shuts her kidneys down     Past Medical History:  Diagnosis Date   Acute ischemic left MCA stroke (HCC) 01/29/2015   a.) MRI brain 01/29/2015: acute left MCA  territory infarcts involving the posterior left frontal lobe and left insular cortex   Acute renal failure superimposed on stage 4 chronic kidney disease (HCC) 01/29/2015   Anemia of chronic renal failure    Anxiety    Arthritis    Complication of anesthesia    a.) PONV; b.) awareness under anesthesia   COPD (chronic obstructive pulmonary disease) (HCC)    Depression    Dyspnea    ESRD (end stage renal disease) (HCC)    Headache    HFrEF (heart failure with reduced ejection fraction) (HCC)    a.) TTE 01/30/2015: EF 30-35%, diff HK, LAE, G2DD; b.) TTE 06/13/2015: EF 40-45%, diff HK, G1DD; c.) TTE 10/06/2017: EF 60-65% MAC, no IAS; d.) TTE 09/29/2021: EF 45-50%;, LVH, LAE, AoV sclerosis, G1DD; e.) TTE 02/24/2022: EF 60-65%, no IAS   HTN (hypertension)    Leukocytosis    Long term current use of anticoagulant    a.) apixaban    Long term current use of antithrombotics/antiplatelets    a.) clopidogrel    NICM (nonischemic cardiomyopathy) (HCC)    a.) TTE 01/30/2015: EF 30-35%; b.) R/LHC 03/25/2015: EF 25-30%, mRA 13, mPA 30, mPCWP 26, LVEDP 17, CO 3.66, CI 1.8; c.) TTE 06/13/2015: EF 40-45%; d.) TTE 10/06/2017: EF 60-65%; e.) TTE 09/29/2021: EF 45-50%; f.) TTE 02/24/2022: EF 60-65%   Obesity    On supplemental oxygen  by nasal cannula    a.) 2L/[place] at night   Osteoporosis    Peripheral vascular disease (HCC)    Type 2 diabetes mellitus treated with insulin  [facility])      Past Surgical History:  Procedure Laterality Date   ABDOMINAL HYSTERECTOMY     ANKLE FRACTURE SURGERY Left 02/09/2000   AV FISTULA PLACEMENT Left 06/18/2022   Procedure: ARTERIOVENOUS (AV) FISTULA CREATION (BRACHIAL AXILLA);  Surgeon: Jackquelyn Mass, MD;  Location: [facility] ORS;  Service: Vascular;  Laterality: Left;   BILATERAL SALPINGOOPHORECTOMY  02/08/1998   CARDIAC CATHETERIZATION N/A 03/25/2015   Procedure: Right/Left Heart Cath and Coronary Angiography;  Surgeon: Arty Binning, MD;  Location: [facility] INVASIVE CV LAB;   Service: Cardiovascular;  Laterality: N/A;   CESAREAN SECTION  02/09/1976   COLONOSCOPY WITH PROPOFOL  N/A 09/22/2016   Procedure: COLONOSCOPY WITH PROPOFOL ;  Surgeon: Cassie Click, MD;  Location: [facility] ENDOSCOPY;  Service: Endoscopy;  Laterality: N/A;   COLONOSCOPY WITH PROPOFOL  N/A 02/15/2022   Procedure: COLONOSCOPY WITH PROPOFOL ;  Surgeon: Selena Daily, MD;  Location: [facility] ENDOSCOPY;  Service: Gastroenterology;  Laterality: N/A;   DIALYSIS/PERMA CATHETER INSERTION N/A 01/19/2023   Procedure: DIALYSIS/PERMA CATHETER INSERTION;  Surgeon: Celso College, MD;  Location: [facility] INVASIVE CV LAB;  Service: Cardiovascular;  Laterality: N/A;   EP IMPLANTABLE DEVICE N/A 08/05/2015   Procedure: Loop Recorder Insertion;  Surgeon: Verona Goodwill, MD;  Location: [facility] INVASIVE CV LAB;  Service: Cardiovascular;  Laterality: N/A;   ESOPHAGOGASTRODUODENOSCOPY N/A 02/15/2022   Procedure: ESOPHAGOGASTRODUODENOSCOPY (EGD);  Surgeon: Selena Daily, MD;  Location: [facility] ENDOSCOPY;  Service: Gastroenterology;  Laterality: N/A;   ESOPHAGOGASTRODUODENOSCOPY (EGD) WITH PROPOFOL  N/A 09/22/2016   Procedure: ESOPHAGOGASTRODUODENOSCOPY (EGD) WITH PROPOFOL ;  Surgeon: Cassie Click, MD;  Location: [facility] ENDOSCOPY;  Service: Endoscopy;  Laterality: N/A;   ESOPHAGOGASTRODUODENOSCOPY (EGD) WITH PROPOFOL  N/A 12/08/2016   Procedure: ESOPHAGOGASTRODUODENOSCOPY (EGD) WITH PROPOFOL ;  Surgeon: Jace Martinet  T, MD;  Location: [facility] ENDOSCOPY;  Service: Endoscopy;  Laterality: N/A;   ESOPHAGOGASTRODUODENOSCOPY (EGD) WITH PROPOFOL  N/A 10/12/2022   Procedure: ESOPHAGOGASTRODUODENOSCOPY (EGD) WITH PROPOFOL ;  Surgeon: Selena Daily, MD;  Location: [facility] ENDOSCOPY;  Service: Gastroenterology;  Laterality: N/A;   FACIAL LACERATION REPAIR N/A 12/27/2022   Procedure: FACIAL LACERATION REPAIR;  Surgeon: Rush Coupe, MD;  Location: [facility] OR;  Service: ENT;  Laterality: N/A;   FRACTURE SURGERY     GIVENS CAPSULE STUDY N/A 04/19/2022    Procedure: GIVENS CAPSULE STUDY;  Surgeon: Selena Daily, MD;  Location: [facility] ENDOSCOPY;  Service: Gastroenterology;  Laterality: N/A;   GIVENS CAPSULE STUDY N/A 10/12/2022   Procedure: GIVENS CAPSULE STUDY;  Surgeon: Selena Daily, MD;  Location: [facility] ENDOSCOPY;  Service: Gastroenterology;  Laterality: N/A;   INSERTION HYBRID ANTERIOVENOUS GORTEX GRAFT  06/18/2022   Procedure: INSERTION HYBRID ANTERIOVENOUS GORTEX GRAFT;  Surgeon: Jackquelyn Mass, MD;  Location: [facility] ORS;  Service: Vascular;;   KNEE ARTHROSCOPY Left 02/09/2003   LOWER EXTREMITY ANGIOGRAPHY Left 08/12/2021   Procedure: Lower Extremity Angiography;  Surgeon: Jackquelyn Mass, MD;  Location: [facility] INVASIVE CV LAB;  Service: Cardiovascular;  Laterality: Left;   PERIPHERAL VASCULAR THROMBECTOMY Left 01/19/2023   Procedure: PERIPHERAL VASCULAR THROMBECTOMY;  Surgeon: Celso College, MD;  Location: [facility] INVASIVE CV LAB;  Service: Cardiovascular;  Laterality: Left;   TEE WITHOUT CARDIOVERSION N/A 01/31/2015   Procedure: TRANSESOPHAGEAL ECHOCARDIOGRAM (TEE);  Surgeon: Lenise Quince, MD;  Location: [facility] ENDOSCOPY;  Service: Cardiovascular;  Laterality: N/A;   TEMPORARY DIALYSIS CATHETER N/A 08/14/2021   Procedure: TEMPORARY DIALYSIS CATHETER;  Surgeon: Jackquelyn Mass, MD;  Location: [facility] INVASIVE CV LAB;  Service: Cardiovascular;  Laterality: N/A;   TUBAL LIGATION  02/09/1976   ULNAR NERVE TRANSPOSITION  02/08/2006    Social History   Socioeconomic History   Marital status: Married    Spouse name: Mara Seminole   Number of children: 2   Years of education: Not on file   Highest education level: Bachelor's degree (e.g., BA, AB, BS)  Occupational History   Occupation: disabled    Comment: retired  Tobacco Use   Smoking status: Former    Current packs/day: 0.00    Average packs/day: 0.5 packs/day for 48.3 years (24.2 ttl pk-yrs)    Types: Cigarettes    Start date: 02/08/1974    Quit date: 05/31/2022    Years since  quitting: 1.0   Smokeless tobacco: Never  Vaping Use   Vaping status: Never Used  Substance and Sexual Activity   Alcohol use: No    Alcohol/week: 0.0 standard drinks of alcohol   Drug use: No   Sexual activity: Yes    Birth control/protection: None  Other Topics Concern   Not on file  Social History Narrative   Lives at home with stepson and husband, dogs and cats   Caffeine  Drinks sweet tea.   Right handed.    Social Drivers of Corporate investment banker Strain: Low Risk  (12/11/2021)   Overall Financial Resource Strain (CARDIA)    Difficulty of Paying Living Expenses: Not very hard  Food Insecurity: No Food Insecurity (12/27/2022)   Hunger Vital Sign    Worried About Running Out of Food in the Last Year: Never true    Ran Out of Food in the Last Year: Never true  Transportation Needs: No Transportation Needs (12/27/2022)   PRAPARE - Transportation    Lack of Transportation (Medical): No    Lack of  Transportation (Non-Medical): No  Physical Activity: Inactive (06/01/2022)   Exercise Vital Sign    Days of Exercise per Week: 0 days    Minutes of Exercise per Session: 0 min  Stress: No Stress Concern Present (06/01/2022)   [employer] of Occupational Health - Occupational Stress Questionnaire    Feeling of Stress : Only a little  Social Connections: Moderately Isolated (12/11/2021)   Social Connection and Isolation Panel [NHANES]    Frequency of Communication with Friends and Family: More than three times a week    Frequency of Social Gatherings with Friends and Family: Once a week    Attends Religious Services: Never    Database administrator or Organizations: No    Attends Banker Meetings: Never    Marital Status: Married  Catering manager Violence: Not At Risk (12/27/2022)   Humiliation, Afraid, Rape, and Kick questionnaire    Fear of Current or Ex-Partner: No    Emotionally Abused: No    Physically Abused: No    Sexually Abused: No    Family  History  Problem Relation Age of Onset   Heart disease Mother        died from CHF   Asthma Mother    Diabetes Mother    Heart disease Father    Aneurysm Father    COPD Brother    Diabetes Brother    Alcohol abuse Paternal Aunt    Cancer Maternal Grandmother        unknownorigin   Anemia Neg Hx    Arrhythmia Neg Hx    Clotting disorder Neg Hx    Fainting Neg Hx    Heart attack Neg Hx    Heart failure Neg Hx    Hyperlipidemia Neg Hx    Hypertension Neg Hx      Current Outpatient Medications:    albuterol  (VENTOLIN  HFA) 108 (90 Base) MCG/ACT inhaler, TAKE 2 PUFFS BY MOUTH EVERY 6 HOURS AS NEEDED FOR WHEEZE OR SHORTNESS OF BREATH (Patient taking differently: Inhale 2 puffs into the lungs every 6 (six) hours as needed for wheezing or shortness of breath.), Disp: 8.5 each, Rfl: 2   apixaban  (ELIQUIS ) 5 MG TABS tablet, Take 1 tablet (5 mg total) by mouth 2 (two) times daily., Disp: 180 tablet, Rfl: 2   atorvastatin  (LIPITOR) 40 MG tablet, Take 1 tablet (40 mg total) by mouth daily., Disp: 90 tablet, Rfl: 3   buPROPion  (WELLBUTRIN  SR) 150 MG 12 hr tablet, Take 1 tablet (150 mg total) by mouth 2 (two) times daily., Disp: 180 tablet, Rfl: 1   calcium  carbonate (TUMS EX) 750 MG chewable tablet, Chew 1 tablet by mouth daily., Disp: , Rfl:    Dulaglutide  (TRULICITY ) 4.5 MG/0.5ML SOPN, Inject 4.5 mg into the skin every 7 (seven) days. (Patient taking differently: Inject 4.5 mg into the skin every 7 (seven) days. Mondays), Disp: 6 mL, Rfl: 3   fluticasone  (FLONASE ) 50 MCG/ACT nasal spray, Place 2 sprays into both nostrils daily. (Patient taking differently: Place 2 sprays into both nostrils as needed.), Disp: 16 g, Rfl: 6   gabapentin  (NEURONTIN ) 300 MG capsule, Take 300 mg by mouth daily., Disp: , Rfl:    hydroxyurea  (HYDREA ) 500 MG capsule, TAKE 1 CAPSULE BY MOUTH EVERY OTHER DAY. MAY TAKE WITH FOOD TO MINIMIZE GI SIDE EFFECTS., Disp: 45 capsule, Rfl: 1   insulin  aspart (NOVOLOG ) 100 UNIT/ML  injection, Insulin  pump, Disp: , Rfl:    Insulin  Disposable Pump (OMNIPOD DASH PODS, GEN 4,)  MISC, USE 1 POD EACH EVERY 72    HOURS, Disp: , Rfl:    Insulin  Human (INSULIN  PUMP) SOLN, Per endocrinoloy, Disp: , Rfl:    loratadine  (CLARITIN ) 10 MG tablet, Take 1 tablet by mouth daily., Disp: , Rfl:    losartan  (COZAAR ) 50 MG tablet, Take 50 mg by mouth daily., Disp: , Rfl:    meclizine  (ANTIVERT ) 12.5 MG tablet, Take 1 tablet (12.5 mg total) by mouth 3 (three) times daily as needed for dizziness., Disp: 30 tablet, Rfl: 0   OXYGEN , Place 2 L into the nose continuous., Disp: , Rfl:    PARoxetine  (PAXIL ) 40 MG tablet, Take 1 tablet (40 mg total) by mouth daily., Disp: 90 tablet, Rfl: 1   Spacer/Aero-Holding Chambers (OPTICHAMBER DIAMOND ) MISC, Use with inhaler to ensure medication is delivered throughout lungs, Disp: 1 each, Rfl: 0   torsemide  (DEMADEX ) 20 MG tablet, TAKE 3 TABLETS BY MOUTH EVERY DAY (Patient taking differently: Take 40 mg by mouth daily. TAKE 2 TABLETS BY MOUTH EVERY DAY), Disp: 270 tablet, Rfl: 1   traMADol  (ULTRAM ) 50 MG tablet, Take 1 tablet (50 mg total) by mouth 2 (two) times daily as needed., Disp: 30 tablet, Rfl: 2   Vitamin D , Ergocalciferol , (DRISDOL ) 1.25 MG (50000 UNIT) CAPS capsule, Take 50,000 Units by mouth every 7 (seven) days. Sunday, Disp: , Rfl:   Physical exam:  Vitals:   06/22/23 0939  BP: (!) 128/58  Pulse: 72  Resp: 16  Temp: 97.6 F (36.4 C)  TempSrc: Tympanic  SpO2: 95%  Weight: 204 lb (92.5 kg)   Physical Exam Constitutional:      Comments: Sitting in a wheelchair.  Appears in no acute distress  Cardiovascular:     Rate and Rhythm: Normal rate and regular rhythm.     Heart sounds: Normal heart sounds.  Pulmonary:     Effort: Pulmonary effort is normal.     Breath sounds: Normal breath sounds.  Abdominal:     General: Bowel sounds are normal.     Palpations: Abdomen is soft.  Skin:    General: Skin is warm and dry.  Neurological:     Mental  Status: She is alert and oriented to person, place, and time.      I have personally reviewed labs listed below:    Latest Ref Rng & Units 06/22/2023    9:18 AM  CMP  Glucose 70 - 99 mg/dL 161   BUN 8 - 23 mg/dL 31   Creatinine 0.96 - 1.00 mg/dL 0.45   Sodium 409 - 811 mmol/L 132   Potassium 3.5 - 5.1 mmol/L 4.0   Chloride 98 - 111 mmol/L 98   CO2 22 - 32 mmol/L 25   Calcium  8.9 - 10.3 mg/dL 8.6   Total Protein 6.5 - 8.1 g/dL 6.6   Total Bilirubin 0.0 - 1.2 mg/dL 0.5   Alkaline Phos 38 - 126 U/L 63   AST 15 - 41 U/L 21   ALT 0 - 44 U/L 24       Latest Ref Rng & Units 06/22/2023    9:18 AM  CBC  WBC 4.0 - 10.5 K/uL 8.1   Hemoglobin 12.0 - 15.0 g/dL 91.4   Hematocrit 78.2 - 46.0 % 41.5   Platelets 150 - 400 K/uL 346     Assessment and plan- Patient is a 68 y.o. female who is here for follow-up of following issues  JAK2 positive essential thrombocytosis: Platelets are presently at goal less  than 400.  She will continue with Hydrea  500 mg every other day.  She is also on Eliquis  for her history of DVT.  Anemia of chronic kidney disease: Hemoglobin currently doing well at 12.9.  Iron  studies are within normal limits.  She gets IV iron  through dialysis.  CBC ferritin and iron  studies in 3 and 6 months and I will see her back in 6 months   Visit Diagnosis 1. High risk medication use   2. Anemia in stage 4 chronic kidney disease (HCC)   3. Iron  deficiency anemia due to chronic blood loss   4. Essential thrombocytosis (HCC)      Dr. Seretha Dance, MD, MPH [facility] [phone] 06/23/2023 9:11 AM

## 2023-06-24 DIAGNOSIS — D631 Anemia in chronic kidney disease: Secondary | ICD-10-CM | POA: Diagnosis not present

## 2023-06-24 DIAGNOSIS — N186 End stage renal disease: Secondary | ICD-10-CM | POA: Diagnosis not present

## 2023-06-24 DIAGNOSIS — N2581 Secondary hyperparathyroidism of renal origin: Secondary | ICD-10-CM | POA: Diagnosis not present

## 2023-06-24 DIAGNOSIS — Z992 Dependence on renal dialysis: Secondary | ICD-10-CM | POA: Diagnosis not present

## 2023-06-24 DIAGNOSIS — D509 Iron deficiency anemia, unspecified: Secondary | ICD-10-CM | POA: Diagnosis not present

## 2023-06-24 DIAGNOSIS — N250 Renal osteodystrophy: Secondary | ICD-10-CM | POA: Diagnosis not present

## 2023-06-25 DIAGNOSIS — D509 Iron deficiency anemia, unspecified: Secondary | ICD-10-CM | POA: Diagnosis not present

## 2023-06-25 DIAGNOSIS — N2581 Secondary hyperparathyroidism of renal origin: Secondary | ICD-10-CM | POA: Diagnosis not present

## 2023-06-25 DIAGNOSIS — N186 End stage renal disease: Secondary | ICD-10-CM | POA: Diagnosis not present

## 2023-06-25 DIAGNOSIS — Z992 Dependence on renal dialysis: Secondary | ICD-10-CM | POA: Diagnosis not present

## 2023-06-25 DIAGNOSIS — D631 Anemia in chronic kidney disease: Secondary | ICD-10-CM | POA: Diagnosis not present

## 2023-06-25 DIAGNOSIS — N250 Renal osteodystrophy: Secondary | ICD-10-CM | POA: Diagnosis not present

## 2023-06-27 DIAGNOSIS — M1030 Gout due to renal impairment, unspecified site: Secondary | ICD-10-CM | POA: Diagnosis not present

## 2023-06-27 DIAGNOSIS — Z992 Dependence on renal dialysis: Secondary | ICD-10-CM | POA: Diagnosis not present

## 2023-06-27 DIAGNOSIS — D631 Anemia in chronic kidney disease: Secondary | ICD-10-CM | POA: Diagnosis not present

## 2023-06-27 DIAGNOSIS — T82868A Thrombosis of vascular prosthetic devices, implants and grafts, initial encounter: Secondary | ICD-10-CM | POA: Diagnosis not present

## 2023-06-27 DIAGNOSIS — I132 Hypertensive heart and chronic kidney disease with heart failure and with stage 5 chronic kidney disease, or end stage renal disease: Secondary | ICD-10-CM | POA: Diagnosis not present

## 2023-06-27 DIAGNOSIS — Z720 Tobacco use: Secondary | ICD-10-CM | POA: Diagnosis not present

## 2023-06-27 DIAGNOSIS — T82858A Stenosis of vascular prosthetic devices, implants and grafts, initial encounter: Secondary | ICD-10-CM | POA: Diagnosis not present

## 2023-06-27 DIAGNOSIS — N186 End stage renal disease: Secondary | ICD-10-CM | POA: Diagnosis not present

## 2023-06-27 DIAGNOSIS — J449 Chronic obstructive pulmonary disease, unspecified: Secondary | ICD-10-CM | POA: Diagnosis not present

## 2023-06-27 DIAGNOSIS — E1122 Type 2 diabetes mellitus with diabetic chronic kidney disease: Secondary | ICD-10-CM | POA: Diagnosis not present

## 2023-06-27 DIAGNOSIS — I771 Stricture of artery: Secondary | ICD-10-CM | POA: Diagnosis not present

## 2023-06-27 DIAGNOSIS — I509 Heart failure, unspecified: Secondary | ICD-10-CM | POA: Diagnosis not present

## 2023-06-28 ENCOUNTER — Encounter (INDEPENDENT_AMBULATORY_CARE_PROVIDER_SITE_OTHER): Payer: Self-pay

## 2023-06-29 ENCOUNTER — Other Ambulatory Visit: Payer: Self-pay

## 2023-06-29 ENCOUNTER — Emergency Department

## 2023-06-29 ENCOUNTER — Emergency Department
Admission: EM | Admit: 2023-06-29 | Discharge: 2023-06-29 | Disposition: A | Discharge status: Home / Self Care | Attending: Emergency Medicine | Admitting: Emergency Medicine

## 2023-06-29 DIAGNOSIS — J449 Chronic obstructive pulmonary disease, unspecified: Secondary | ICD-10-CM | POA: Diagnosis not present

## 2023-06-29 DIAGNOSIS — I517 Cardiomegaly: Secondary | ICD-10-CM | POA: Diagnosis not present

## 2023-06-29 DIAGNOSIS — W19XXXA Unspecified fall, initial encounter: Secondary | ICD-10-CM | POA: Insufficient documentation

## 2023-06-29 DIAGNOSIS — D631 Anemia in chronic kidney disease: Secondary | ICD-10-CM | POA: Diagnosis not present

## 2023-06-29 DIAGNOSIS — S0990XA Unspecified injury of head, initial encounter: Secondary | ICD-10-CM | POA: Insufficient documentation

## 2023-06-29 DIAGNOSIS — R296 Repeated falls: Secondary | ICD-10-CM | POA: Insufficient documentation

## 2023-06-29 DIAGNOSIS — N186 End stage renal disease: Secondary | ICD-10-CM | POA: Diagnosis not present

## 2023-06-29 DIAGNOSIS — D509 Iron deficiency anemia, unspecified: Secondary | ICD-10-CM | POA: Diagnosis not present

## 2023-06-29 DIAGNOSIS — Z992 Dependence on renal dialysis: Secondary | ICD-10-CM | POA: Insufficient documentation

## 2023-06-29 DIAGNOSIS — I6529 Occlusion and stenosis of unspecified carotid artery: Secondary | ICD-10-CM | POA: Diagnosis not present

## 2023-06-29 DIAGNOSIS — E1165 Type 2 diabetes mellitus with hyperglycemia: Secondary | ICD-10-CM | POA: Diagnosis not present

## 2023-06-29 DIAGNOSIS — R739 Hyperglycemia, unspecified: Secondary | ICD-10-CM | POA: Diagnosis not present

## 2023-06-29 DIAGNOSIS — S199XXA Unspecified injury of neck, initial encounter: Secondary | ICD-10-CM | POA: Diagnosis not present

## 2023-06-29 DIAGNOSIS — N250 Renal osteodystrophy: Secondary | ICD-10-CM | POA: Diagnosis not present

## 2023-06-29 DIAGNOSIS — R0602 Shortness of breath: Secondary | ICD-10-CM | POA: Diagnosis not present

## 2023-06-29 DIAGNOSIS — N2581 Secondary hyperparathyroidism of renal origin: Secondary | ICD-10-CM | POA: Diagnosis not present

## 2023-06-29 LAB — RESP PANEL BY RT-PCR (RSV, FLU A&B, COVID)  RVPGX2
Influenza A by PCR: NEGATIVE
Influenza B by PCR: NEGATIVE
Resp Syncytial Virus by PCR: NEGATIVE
SARS Coronavirus 2 by RT PCR: NEGATIVE

## 2023-06-29 LAB — URINALYSIS, ROUTINE W REFLEX MICROSCOPIC
Bilirubin Urine: NEGATIVE
Glucose, UA: >=500 mg/dL — AB
Ketones, ur: NEGATIVE mg/dL
Leukocytes,Ua: NEGATIVE
Nitrite: NEGATIVE
Protein, ur: 100 mg/dL — AB
Specific Gravity, Urine: 1.016 (ref 1.005–1.030)
pH: 5 (ref 5.0–8.0)

## 2023-06-29 LAB — BLOOD GAS, VENOUS
Acid-Base Excess: 6.9 mmol/L — ABNORMAL HIGH (ref 0.0–2.0)
Bicarbonate: 33 mmol/L — ABNORMAL HIGH (ref 20.0–28.0)
O2 Saturation: 64.8 %
Patient temperature: 37
pCO2, Ven: 52 mmHg (ref 44–60)
pH, Ven: 7.41 (ref 7.25–7.43)
pO2, Ven: 38 mmHg (ref 32–45)

## 2023-06-29 LAB — CBC
HCT: 37.8 % (ref 36.0–46.0)
Hemoglobin: 12.7 g/dL (ref 12.0–15.0)
MCH: 30.9 pg (ref 26.0–34.0)
MCHC: 33.6 g/dL (ref 30.0–36.0)
MCV: 92 fL (ref 80.0–100.0)
Platelets: 286 10*3/uL (ref 150–400)
RBC: 4.11 MIL/uL (ref 3.87–5.11)
RDW: 15.9 % — ABNORMAL HIGH (ref 11.5–15.5)
WBC: 11.3 10*3/uL — ABNORMAL HIGH (ref 4.0–10.5)
nRBC: 0 % (ref 0.0–0.2)

## 2023-06-29 LAB — COMPREHENSIVE METABOLIC PANEL WITH GFR
ALT: 22 U/L (ref 0–44)
AST: 29 U/L (ref 15–41)
Albumin: 3.5 g/dL (ref 3.5–5.0)
Alkaline Phosphatase: 75 U/L (ref 38–126)
Anion gap: 14 (ref 5–15)
BUN: 20 mg/dL (ref 8–23)
CO2: 25 mmol/L (ref 22–32)
Calcium: 8.5 mg/dL — ABNORMAL LOW (ref 8.9–10.3)
Chloride: 93 mmol/L — ABNORMAL LOW (ref 98–111)
Creatinine, Ser: 2.26 mg/dL — ABNORMAL HIGH (ref 0.44–1.00)
GFR, Estimated: 23 mL/min — ABNORMAL LOW (ref >60)
Glucose, Bld: 490 mg/dL — ABNORMAL HIGH (ref 70–99)
Potassium: 3.8 mmol/L (ref 3.5–5.1)
Sodium: 132 mmol/L — ABNORMAL LOW (ref 135–145)
Total Bilirubin: 0.9 mg/dL (ref 0.0–1.2)
Total Protein: 6.4 g/dL — ABNORMAL LOW (ref 6.5–8.1)

## 2023-06-29 LAB — CBG MONITORING, ED
Glucose-Capillary: 469 mg/dL — ABNORMAL HIGH (ref 70–99)
Glucose-Capillary: 464 mg/dL — ABNORMAL HIGH (ref 70–99)

## 2023-06-29 LAB — BETA-HYDROXYBUTYRIC ACID: Beta-Hydroxybutyric Acid: 1.67 mmol/L — ABNORMAL HIGH (ref 0.05–0.27)

## 2023-06-29 NOTE — ED Provider Notes (Addendum)
 [facility] Provider Note    Event Date/Time   First MD Initiated Contact with Patient 06/29/23 1743     (approximate)   History   Hyperglycemia   HPI  Jocelyn Wells is a 68 y.o. female who comes in with concerns for hypoglycemia.  According to family her sugars over 600.  Patient had recent procedure on her left arm to clean out a fistula.  I reviewed the note from endocrinology on 06/13/2023.  Patient is on dialysis.  Her sugars they are ranged between 90 and 486 but are consistently high.  She is getting insulin  pump. Pump has delivered on avg 57 units of insulin  daily, 47% (27 units) as basal and 53% as bolus. She enters on avg 63 g carbs daily into the pump and boluses on avg 1.8 time daily. Continues to struggle with remembering to check sugars and to bolus insulin .    Family report that she has fallen 3-4 times yesterday.  She is not sure exactly why she fell she denies blacking out.  Patient reports that she has frequent history of falling and that this is pretty typical for her.  Denies dizziness, cp or sob. Just her legs get off balance.  She does report hitting her head.  She denies any other injuries.  She does report using oxygen  at nighttime but no new shortness of breath that she is obviously aware of.  She reports concern that her insulin  pump is beeping and she tried to replace it but still making noises and unclear exactly why.  She denies any chest pain, abdominal pain.  She did get dialysis today she is got a left chest wall catheter.  Physical Exam   Triage Vital Signs: ED Triage Vitals  Encounter Vitals Group     BP 06/29/23 1738 132/73     Systolic BP Percentile --      Diastolic BP Percentile --      Pulse Rate 06/29/23 1738 88     Resp 06/29/23 1738 18     Temp 06/29/23 1738 97.8 F (36.6 C)     Temp Source 06/29/23 1738 Oral     SpO2 06/29/23 1738 93 %     Weight 06/29/23 1744 200 lb (90.7 kg)     Height 06/29/23 1744 5\' 4"   (1.626 m)     Head Circumference --      Peak Flow --      Pain Score 06/29/23 1743 0     Pain Loc --      Pain Education --      Exclude from Growth Chart --     Most recent vital signs: Vitals:   06/29/23 1738  BP: 132/73  Pulse: 88  Resp: 18  Temp: 97.8 F (36.6 C)  SpO2: 93%     General: Awake, no distress.  CV:  Good peripheral perfusion.  Resp:  Normal effort.  Abd:  No distention.  Other:  Cranial nerves are intact equal strength in arms and legs.  Abdomen soft and nontender.   ED Results / Procedures / Treatments   Labs (all labs ordered are listed, but only abnormal results are displayed) Labs Reviewed  CBG MONITORING, ED - Abnormal; Notable for the following components:      Result Value   Glucose-Capillary 469 (*)    All other components within normal limits  BASIC METABOLIC PANEL WITH GFR  CBC  URINALYSIS, ROUTINE W REFLEX MICROSCOPIC  CBG MONITORING, ED  CBG  MONITORING, ED     EKG  My interpretation of EKG:  Declined   RADIOLOGY I have reviewed the xray personally and interpreted no edema    PROCEDURES:  Critical Care performed: No  Procedures   MEDICATIONS ORDERED IN ED: Medications - No data to display   IMPRESSION / MDM / ASSESSMENT AND PLAN / ED COURSE  I reviewed the triage vital signs and the nursing notes.   Patient's presentation is most consistent with acute presentation with potential threat to life or bodily function.   Patient comes in with concerns for elevated sugar.  She is concerned that her insulin  pump was not working as well as it is occasionally beeping.  However she did just replace it and she did not bring the monitor part to see what it shows on their.  They were concerned with sugars over 600 at home.  She does have some falls but she states that this is common for her as she frequently loses footing.  O2 levels are slightly low but patient reports COPD using oxygen  at baseline this is around her baseline.   Denies any new shortness of breath  Glucose was elevated at 460 which is similar to her baseline according to the notes from endocrine that she symptoms runs at this time normally.  Her urine is without evidence of UTI given significant squamous cells COVID, flu are negative.  VBG without any evidence of acidosis CBC reassuring CMP shows creatinine that is downtrending but patient is on dialysis no need for emergent dialysis.  Beta hydroxybutyrate is elevated but we are holding off on fluids given patient is a dialysis patient we discussed p.o. hydration.  X-ray without evidence of any pneumonia does have cardiomegaly.  CT head and neck were negative  I recommended admission to the patient given the concerns for hyperglycemia and her pump not working.  We discussed this and I had tried calling the diabetes coordinator, pharmacy to try troubleshooting it but neither of them were able to help tonight.  Diabetes coordinator was not available since it was after 5 and the pharmacist were not as familiar with the pumps.  Patient also did not bring the other part of the pump that helps with the controls.  I recommended that she be admitted to talk to the diabetes coordinator tomorrow to help facilitate getting the sugars better controlled.  Patient however declined.  She states that she is going to try to put a new pump on tonight and if is not working she is got some insulin  that she is taken previously that she will try to use.  We discussed the dangers of too much insulin  in the setting of renal disease and she is aware of this she has got family members who can stay with her she is at dialysis tomorrow morning and does not want to miss it.  Discussed with patient that we could do dialysis here in the hospital if necessary but patient again declines.  She does not want to stay in the hospital overnight and will follow-up with her endocrinologist outpatient.  The son  is at bedside and witnesses this  conversation  Discussed with patient that EKG, troponins were not done evaluate for any ACS.  She denies any chest pain shortness of breath dizziness.  Patient does not want to stay any longer and declines having this done.  Her family is at bedside and shared this conversation that I have not fully ruled out her heart but she  reports that she has had similar episodes with these falls previously and she does not feel like it is related to her heart.  The patient is on the cardiac monitor to evaluate for evidence of arrhythmia and/or significant heart rate changes.      FINAL CLINICAL IMPRESSION(S) / ED DIAGNOSES   Final diagnoses:  Hyperglycemia     Rx / DC Orders   ED Discharge Orders     None        Note:  This document was prepared using Dragon voice recognition software and may include unintentional dictation errors.   Lubertha Rush, MD 06/29/23 2152    Lubertha Rush, MD 06/29/23 2153

## 2023-06-29 NOTE — ED Notes (Signed)
 ED Provider at bedside.

## 2023-06-29 NOTE — ED Notes (Signed)
 Patient transported to CT

## 2023-06-29 NOTE — Discharge Instructions (Signed)
 We discussed admission to the hospital to monitor your sugars with the insulin  pump being turned off and try to have someone try to troubleshoot it tomorrow but you have declined return to the ER if you develop worsening symptoms or any other concerns

## 2023-06-29 NOTE — ED Triage Notes (Signed)
 Pt arrives via POV from home with c/o hyperglycemia (over 600) for 3 days. Per family, sugar has been over 600 but would come down into the 300's but would go back up. Per family pt had a procedure on Monday to clean out the fistula that is on their left arm.

## 2023-06-29 NOTE — ED Notes (Signed)
 Pt assisted to commode in room w/ stand-by assist, urine sample collected, and pt assisted back to bed. Pt denies needs at this time. Family at bedside.

## 2023-06-30 DIAGNOSIS — D631 Anemia in chronic kidney disease: Secondary | ICD-10-CM | POA: Diagnosis not present

## 2023-06-30 DIAGNOSIS — N250 Renal osteodystrophy: Secondary | ICD-10-CM | POA: Diagnosis not present

## 2023-06-30 DIAGNOSIS — N186 End stage renal disease: Secondary | ICD-10-CM | POA: Diagnosis not present

## 2023-06-30 DIAGNOSIS — N2581 Secondary hyperparathyroidism of renal origin: Secondary | ICD-10-CM | POA: Diagnosis not present

## 2023-06-30 DIAGNOSIS — Z992 Dependence on renal dialysis: Secondary | ICD-10-CM | POA: Diagnosis not present

## 2023-06-30 DIAGNOSIS — D509 Iron deficiency anemia, unspecified: Secondary | ICD-10-CM | POA: Diagnosis not present

## 2023-07-02 DIAGNOSIS — N186 End stage renal disease: Secondary | ICD-10-CM | POA: Diagnosis not present

## 2023-07-02 DIAGNOSIS — D509 Iron deficiency anemia, unspecified: Secondary | ICD-10-CM | POA: Diagnosis not present

## 2023-07-02 DIAGNOSIS — Z992 Dependence on renal dialysis: Secondary | ICD-10-CM | POA: Diagnosis not present

## 2023-07-02 DIAGNOSIS — N2581 Secondary hyperparathyroidism of renal origin: Secondary | ICD-10-CM | POA: Diagnosis not present

## 2023-07-02 DIAGNOSIS — D631 Anemia in chronic kidney disease: Secondary | ICD-10-CM | POA: Diagnosis not present

## 2023-07-02 DIAGNOSIS — N250 Renal osteodystrophy: Secondary | ICD-10-CM | POA: Diagnosis not present

## 2023-07-05 DIAGNOSIS — N250 Renal osteodystrophy: Secondary | ICD-10-CM | POA: Diagnosis not present

## 2023-07-05 DIAGNOSIS — N186 End stage renal disease: Secondary | ICD-10-CM | POA: Diagnosis not present

## 2023-07-05 DIAGNOSIS — D631 Anemia in chronic kidney disease: Secondary | ICD-10-CM | POA: Diagnosis not present

## 2023-07-05 DIAGNOSIS — D509 Iron deficiency anemia, unspecified: Secondary | ICD-10-CM | POA: Diagnosis not present

## 2023-07-05 DIAGNOSIS — Z992 Dependence on renal dialysis: Secondary | ICD-10-CM | POA: Diagnosis not present

## 2023-07-05 DIAGNOSIS — N2581 Secondary hyperparathyroidism of renal origin: Secondary | ICD-10-CM | POA: Diagnosis not present

## 2023-07-07 DIAGNOSIS — N250 Renal osteodystrophy: Secondary | ICD-10-CM | POA: Diagnosis not present

## 2023-07-07 DIAGNOSIS — N2581 Secondary hyperparathyroidism of renal origin: Secondary | ICD-10-CM | POA: Diagnosis not present

## 2023-07-07 DIAGNOSIS — D631 Anemia in chronic kidney disease: Secondary | ICD-10-CM | POA: Diagnosis not present

## 2023-07-07 DIAGNOSIS — D509 Iron deficiency anemia, unspecified: Secondary | ICD-10-CM | POA: Diagnosis not present

## 2023-07-07 DIAGNOSIS — Z992 Dependence on renal dialysis: Secondary | ICD-10-CM | POA: Diagnosis not present

## 2023-07-07 DIAGNOSIS — N186 End stage renal disease: Secondary | ICD-10-CM | POA: Diagnosis not present

## 2023-07-08 ENCOUNTER — Other Ambulatory Visit: Payer: Self-pay

## 2023-07-08 DIAGNOSIS — Z122 Encounter for screening for malignant neoplasm of respiratory organs: Secondary | ICD-10-CM

## 2023-07-08 DIAGNOSIS — F17210 Nicotine dependence, cigarettes, uncomplicated: Secondary | ICD-10-CM

## 2023-07-08 DIAGNOSIS — E1140 Type 2 diabetes mellitus with diabetic neuropathy, unspecified: Secondary | ICD-10-CM | POA: Diagnosis not present

## 2023-07-08 DIAGNOSIS — B351 Tinea unguium: Secondary | ICD-10-CM | POA: Diagnosis not present

## 2023-07-08 DIAGNOSIS — Z794 Long term (current) use of insulin: Secondary | ICD-10-CM | POA: Diagnosis not present

## 2023-07-08 DIAGNOSIS — Z87891 Personal history of nicotine dependence: Secondary | ICD-10-CM

## 2023-07-09 DIAGNOSIS — N250 Renal osteodystrophy: Secondary | ICD-10-CM | POA: Diagnosis not present

## 2023-07-09 DIAGNOSIS — Z992 Dependence on renal dialysis: Secondary | ICD-10-CM | POA: Diagnosis not present

## 2023-07-09 DIAGNOSIS — D631 Anemia in chronic kidney disease: Secondary | ICD-10-CM | POA: Diagnosis not present

## 2023-07-09 DIAGNOSIS — N2581 Secondary hyperparathyroidism of renal origin: Secondary | ICD-10-CM | POA: Diagnosis not present

## 2023-07-09 DIAGNOSIS — N186 End stage renal disease: Secondary | ICD-10-CM | POA: Diagnosis not present

## 2023-07-09 DIAGNOSIS — D509 Iron deficiency anemia, unspecified: Secondary | ICD-10-CM | POA: Diagnosis not present

## 2023-07-10 DIAGNOSIS — N2581 Secondary hyperparathyroidism of renal origin: Secondary | ICD-10-CM | POA: Diagnosis not present

## 2023-07-10 DIAGNOSIS — N186 End stage renal disease: Secondary | ICD-10-CM | POA: Diagnosis not present

## 2023-07-10 DIAGNOSIS — Z992 Dependence on renal dialysis: Secondary | ICD-10-CM | POA: Diagnosis not present

## 2023-07-11 DIAGNOSIS — I132 Hypertensive heart and chronic kidney disease with heart failure and with stage 5 chronic kidney disease, or end stage renal disease: Secondary | ICD-10-CM | POA: Diagnosis not present

## 2023-07-11 DIAGNOSIS — E1129 Type 2 diabetes mellitus with other diabetic kidney complication: Secondary | ICD-10-CM | POA: Diagnosis not present

## 2023-07-11 DIAGNOSIS — N186 End stage renal disease: Secondary | ICD-10-CM | POA: Diagnosis not present

## 2023-07-11 DIAGNOSIS — E1165 Type 2 diabetes mellitus with hyperglycemia: Secondary | ICD-10-CM | POA: Diagnosis not present

## 2023-07-11 DIAGNOSIS — Z9641 Presence of insulin pump (external) (internal): Secondary | ICD-10-CM | POA: Diagnosis not present

## 2023-07-11 DIAGNOSIS — E113393 Type 2 diabetes mellitus with moderate nonproliferative diabetic retinopathy without macular edema, bilateral: Secondary | ICD-10-CM | POA: Diagnosis not present

## 2023-07-11 DIAGNOSIS — E1142 Type 2 diabetes mellitus with diabetic polyneuropathy: Secondary | ICD-10-CM | POA: Diagnosis not present

## 2023-07-11 DIAGNOSIS — Z720 Tobacco use: Secondary | ICD-10-CM | POA: Diagnosis not present

## 2023-07-11 DIAGNOSIS — E1159 Type 2 diabetes mellitus with other circulatory complications: Secondary | ICD-10-CM | POA: Diagnosis not present

## 2023-07-11 DIAGNOSIS — M810 Age-related osteoporosis without current pathological fracture: Secondary | ICD-10-CM | POA: Diagnosis not present

## 2023-07-11 DIAGNOSIS — E1121 Type 2 diabetes mellitus with diabetic nephropathy: Secondary | ICD-10-CM | POA: Diagnosis not present

## 2023-07-11 DIAGNOSIS — I10 Essential (primary) hypertension: Secondary | ICD-10-CM | POA: Diagnosis not present

## 2023-07-11 DIAGNOSIS — Z4901 Encounter for fitting and adjustment of extracorporeal dialysis catheter: Secondary | ICD-10-CM | POA: Diagnosis not present

## 2023-07-11 DIAGNOSIS — E1122 Type 2 diabetes mellitus with diabetic chronic kidney disease: Secondary | ICD-10-CM | POA: Diagnosis not present

## 2023-07-11 DIAGNOSIS — M1030 Gout due to renal impairment, unspecified site: Secondary | ICD-10-CM | POA: Diagnosis not present

## 2023-07-11 DIAGNOSIS — I509 Heart failure, unspecified: Secondary | ICD-10-CM | POA: Diagnosis not present

## 2023-07-11 DIAGNOSIS — D631 Anemia in chronic kidney disease: Secondary | ICD-10-CM | POA: Diagnosis not present

## 2023-07-11 DIAGNOSIS — Z794 Long term (current) use of insulin: Secondary | ICD-10-CM | POA: Diagnosis not present

## 2023-07-11 LAB — HEMOGLOBIN A1C: Hemoglobin A1C: 15.2

## 2023-07-12 DIAGNOSIS — Z992 Dependence on renal dialysis: Secondary | ICD-10-CM | POA: Diagnosis not present

## 2023-07-12 DIAGNOSIS — N2581 Secondary hyperparathyroidism of renal origin: Secondary | ICD-10-CM | POA: Diagnosis not present

## 2023-07-12 DIAGNOSIS — N186 End stage renal disease: Secondary | ICD-10-CM | POA: Diagnosis not present

## 2023-07-14 DIAGNOSIS — Z992 Dependence on renal dialysis: Secondary | ICD-10-CM | POA: Diagnosis not present

## 2023-07-14 DIAGNOSIS — N2581 Secondary hyperparathyroidism of renal origin: Secondary | ICD-10-CM | POA: Diagnosis not present

## 2023-07-14 DIAGNOSIS — N250 Renal osteodystrophy: Secondary | ICD-10-CM | POA: Diagnosis not present

## 2023-07-14 DIAGNOSIS — D509 Iron deficiency anemia, unspecified: Secondary | ICD-10-CM | POA: Diagnosis not present

## 2023-07-14 DIAGNOSIS — N186 End stage renal disease: Secondary | ICD-10-CM | POA: Diagnosis not present

## 2023-07-15 DIAGNOSIS — H4313 Vitreous hemorrhage, bilateral: Secondary | ICD-10-CM | POA: Diagnosis not present

## 2023-07-15 DIAGNOSIS — H31013 Macula scars of posterior pole (postinflammatory) (post-traumatic), bilateral: Secondary | ICD-10-CM | POA: Diagnosis not present

## 2023-07-15 DIAGNOSIS — H43823 Vitreomacular adhesion, bilateral: Secondary | ICD-10-CM | POA: Diagnosis not present

## 2023-07-15 DIAGNOSIS — E113513 Type 2 diabetes mellitus with proliferative diabetic retinopathy with macular edema, bilateral: Secondary | ICD-10-CM | POA: Diagnosis not present

## 2023-07-15 DIAGNOSIS — H43811 Vitreous degeneration, right eye: Secondary | ICD-10-CM | POA: Diagnosis not present

## 2023-07-15 DIAGNOSIS — H2513 Age-related nuclear cataract, bilateral: Secondary | ICD-10-CM | POA: Diagnosis not present

## 2023-07-16 DIAGNOSIS — D509 Iron deficiency anemia, unspecified: Secondary | ICD-10-CM | POA: Diagnosis not present

## 2023-07-16 DIAGNOSIS — N250 Renal osteodystrophy: Secondary | ICD-10-CM | POA: Diagnosis not present

## 2023-07-16 DIAGNOSIS — N186 End stage renal disease: Secondary | ICD-10-CM | POA: Diagnosis not present

## 2023-07-16 DIAGNOSIS — Z992 Dependence on renal dialysis: Secondary | ICD-10-CM | POA: Diagnosis not present

## 2023-07-16 DIAGNOSIS — N2581 Secondary hyperparathyroidism of renal origin: Secondary | ICD-10-CM | POA: Diagnosis not present

## 2023-07-19 DIAGNOSIS — Z992 Dependence on renal dialysis: Secondary | ICD-10-CM | POA: Diagnosis not present

## 2023-07-19 DIAGNOSIS — D509 Iron deficiency anemia, unspecified: Secondary | ICD-10-CM | POA: Diagnosis not present

## 2023-07-19 DIAGNOSIS — N2581 Secondary hyperparathyroidism of renal origin: Secondary | ICD-10-CM | POA: Diagnosis not present

## 2023-07-19 DIAGNOSIS — N250 Renal osteodystrophy: Secondary | ICD-10-CM | POA: Diagnosis not present

## 2023-07-19 DIAGNOSIS — N186 End stage renal disease: Secondary | ICD-10-CM | POA: Diagnosis not present

## 2023-07-21 DIAGNOSIS — N250 Renal osteodystrophy: Secondary | ICD-10-CM | POA: Diagnosis not present

## 2023-07-21 DIAGNOSIS — Z992 Dependence on renal dialysis: Secondary | ICD-10-CM | POA: Diagnosis not present

## 2023-07-21 DIAGNOSIS — N186 End stage renal disease: Secondary | ICD-10-CM | POA: Diagnosis not present

## 2023-07-21 DIAGNOSIS — D509 Iron deficiency anemia, unspecified: Secondary | ICD-10-CM | POA: Diagnosis not present

## 2023-07-21 DIAGNOSIS — N2581 Secondary hyperparathyroidism of renal origin: Secondary | ICD-10-CM | POA: Diagnosis not present

## 2023-07-23 DIAGNOSIS — N250 Renal osteodystrophy: Secondary | ICD-10-CM | POA: Diagnosis not present

## 2023-07-23 DIAGNOSIS — D509 Iron deficiency anemia, unspecified: Secondary | ICD-10-CM | POA: Diagnosis not present

## 2023-07-23 DIAGNOSIS — N186 End stage renal disease: Secondary | ICD-10-CM | POA: Diagnosis not present

## 2023-07-23 DIAGNOSIS — N2581 Secondary hyperparathyroidism of renal origin: Secondary | ICD-10-CM | POA: Diagnosis not present

## 2023-07-23 DIAGNOSIS — Z992 Dependence on renal dialysis: Secondary | ICD-10-CM | POA: Diagnosis not present

## 2023-07-26 DIAGNOSIS — Z992 Dependence on renal dialysis: Secondary | ICD-10-CM | POA: Diagnosis not present

## 2023-07-26 DIAGNOSIS — N2581 Secondary hyperparathyroidism of renal origin: Secondary | ICD-10-CM | POA: Diagnosis not present

## 2023-07-26 DIAGNOSIS — D509 Iron deficiency anemia, unspecified: Secondary | ICD-10-CM | POA: Diagnosis not present

## 2023-07-26 DIAGNOSIS — N250 Renal osteodystrophy: Secondary | ICD-10-CM | POA: Diagnosis not present

## 2023-07-26 DIAGNOSIS — N186 End stage renal disease: Secondary | ICD-10-CM | POA: Diagnosis not present

## 2023-07-27 NOTE — Progress Notes (Signed)
 Given elevated sugar- infectious causes considered and respiratory panel ordered

## 2023-07-28 DIAGNOSIS — D509 Iron deficiency anemia, unspecified: Secondary | ICD-10-CM | POA: Diagnosis not present

## 2023-07-28 DIAGNOSIS — N2581 Secondary hyperparathyroidism of renal origin: Secondary | ICD-10-CM | POA: Diagnosis not present

## 2023-07-28 DIAGNOSIS — N250 Renal osteodystrophy: Secondary | ICD-10-CM | POA: Diagnosis not present

## 2023-07-28 DIAGNOSIS — N186 End stage renal disease: Secondary | ICD-10-CM | POA: Diagnosis not present

## 2023-07-28 DIAGNOSIS — Z992 Dependence on renal dialysis: Secondary | ICD-10-CM | POA: Diagnosis not present

## 2023-07-30 DIAGNOSIS — N186 End stage renal disease: Secondary | ICD-10-CM | POA: Diagnosis not present

## 2023-07-30 DIAGNOSIS — D509 Iron deficiency anemia, unspecified: Secondary | ICD-10-CM | POA: Diagnosis not present

## 2023-07-30 DIAGNOSIS — N2581 Secondary hyperparathyroidism of renal origin: Secondary | ICD-10-CM | POA: Diagnosis not present

## 2023-07-30 DIAGNOSIS — N250 Renal osteodystrophy: Secondary | ICD-10-CM | POA: Diagnosis not present

## 2023-07-30 DIAGNOSIS — Z992 Dependence on renal dialysis: Secondary | ICD-10-CM | POA: Diagnosis not present

## 2023-08-02 DIAGNOSIS — N186 End stage renal disease: Secondary | ICD-10-CM | POA: Diagnosis not present

## 2023-08-02 DIAGNOSIS — N250 Renal osteodystrophy: Secondary | ICD-10-CM | POA: Diagnosis not present

## 2023-08-02 DIAGNOSIS — Z992 Dependence on renal dialysis: Secondary | ICD-10-CM | POA: Diagnosis not present

## 2023-08-02 DIAGNOSIS — D509 Iron deficiency anemia, unspecified: Secondary | ICD-10-CM | POA: Diagnosis not present

## 2023-08-02 DIAGNOSIS — N2581 Secondary hyperparathyroidism of renal origin: Secondary | ICD-10-CM | POA: Diagnosis not present

## 2023-08-04 DIAGNOSIS — N2581 Secondary hyperparathyroidism of renal origin: Secondary | ICD-10-CM | POA: Diagnosis not present

## 2023-08-04 DIAGNOSIS — D509 Iron deficiency anemia, unspecified: Secondary | ICD-10-CM | POA: Diagnosis not present

## 2023-08-04 DIAGNOSIS — N250 Renal osteodystrophy: Secondary | ICD-10-CM | POA: Diagnosis not present

## 2023-08-04 DIAGNOSIS — Z992 Dependence on renal dialysis: Secondary | ICD-10-CM | POA: Diagnosis not present

## 2023-08-04 DIAGNOSIS — N186 End stage renal disease: Secondary | ICD-10-CM | POA: Diagnosis not present

## 2023-08-06 DIAGNOSIS — N250 Renal osteodystrophy: Secondary | ICD-10-CM | POA: Diagnosis not present

## 2023-08-06 DIAGNOSIS — N2581 Secondary hyperparathyroidism of renal origin: Secondary | ICD-10-CM | POA: Diagnosis not present

## 2023-08-06 DIAGNOSIS — N186 End stage renal disease: Secondary | ICD-10-CM | POA: Diagnosis not present

## 2023-08-06 DIAGNOSIS — Z992 Dependence on renal dialysis: Secondary | ICD-10-CM | POA: Diagnosis not present

## 2023-08-06 DIAGNOSIS — D509 Iron deficiency anemia, unspecified: Secondary | ICD-10-CM | POA: Diagnosis not present

## 2023-08-09 ENCOUNTER — Telehealth: Admitting: Student in an Organized Health Care Education/Training Program

## 2023-08-09 DIAGNOSIS — N186 End stage renal disease: Secondary | ICD-10-CM | POA: Diagnosis not present

## 2023-08-09 DIAGNOSIS — D509 Iron deficiency anemia, unspecified: Secondary | ICD-10-CM | POA: Diagnosis not present

## 2023-08-09 DIAGNOSIS — Z992 Dependence on renal dialysis: Secondary | ICD-10-CM | POA: Diagnosis not present

## 2023-08-10 ENCOUNTER — Ambulatory Visit: Admitting: Family Medicine

## 2023-08-10 ENCOUNTER — Encounter: Admitting: Nurse Practitioner

## 2023-08-11 DIAGNOSIS — D509 Iron deficiency anemia, unspecified: Secondary | ICD-10-CM | POA: Diagnosis not present

## 2023-08-11 DIAGNOSIS — Z992 Dependence on renal dialysis: Secondary | ICD-10-CM | POA: Diagnosis not present

## 2023-08-11 DIAGNOSIS — N186 End stage renal disease: Secondary | ICD-10-CM | POA: Diagnosis not present

## 2023-08-13 DIAGNOSIS — N186 End stage renal disease: Secondary | ICD-10-CM | POA: Diagnosis not present

## 2023-08-13 DIAGNOSIS — D509 Iron deficiency anemia, unspecified: Secondary | ICD-10-CM | POA: Diagnosis not present

## 2023-08-13 DIAGNOSIS — Z992 Dependence on renal dialysis: Secondary | ICD-10-CM | POA: Diagnosis not present

## 2023-08-15 ENCOUNTER — Ambulatory Visit: Attending: Nurse Practitioner | Admitting: Nurse Practitioner

## 2023-08-15 ENCOUNTER — Encounter: Payer: Self-pay | Admitting: Nurse Practitioner

## 2023-08-15 VITALS — BP 131/54 | HR 81 | Temp 98.2°F | Resp 18 | Ht 65.0 in | Wt 200.0 lb

## 2023-08-15 DIAGNOSIS — G8929 Other chronic pain: Secondary | ICD-10-CM | POA: Diagnosis not present

## 2023-08-15 DIAGNOSIS — E1140 Type 2 diabetes mellitus with diabetic neuropathy, unspecified: Secondary | ICD-10-CM | POA: Diagnosis not present

## 2023-08-15 DIAGNOSIS — S22000A Wedge compression fracture of unspecified thoracic vertebra, initial encounter for closed fracture: Secondary | ICD-10-CM | POA: Insufficient documentation

## 2023-08-15 DIAGNOSIS — M25562 Pain in left knee: Secondary | ICD-10-CM | POA: Diagnosis not present

## 2023-08-15 DIAGNOSIS — M1712 Unilateral primary osteoarthritis, left knee: Secondary | ICD-10-CM | POA: Insufficient documentation

## 2023-08-15 DIAGNOSIS — S32010S Wedge compression fracture of first lumbar vertebra, sequela: Secondary | ICD-10-CM | POA: Diagnosis not present

## 2023-08-15 DIAGNOSIS — G894 Chronic pain syndrome: Secondary | ICD-10-CM | POA: Insufficient documentation

## 2023-08-15 DIAGNOSIS — E113319 Type 2 diabetes mellitus with moderate nonproliferative diabetic retinopathy with macular edema, unspecified eye: Secondary | ICD-10-CM | POA: Diagnosis not present

## 2023-08-15 DIAGNOSIS — Z794 Long term (current) use of insulin: Secondary | ICD-10-CM | POA: Diagnosis not present

## 2023-08-15 MED ORDER — TRAMADOL HCL 50 MG PO TABS: 50.0000 mg | ORAL_TABLET | Freq: Two times a day (BID) | ORAL | 2 refills | Status: DC | PRN

## 2023-08-15 NOTE — Progress Notes (Signed)
 PROVIDER NOTE: Interpretation of information contained herein should be left to medically-trained personnel. Specific patient instructions are provided elsewhere under Patient Instructions section of medical record. This document was created in part using AI and STT-dictation technology, any transcriptional errors that may result from this process are unintentional.  Patient: Jocelyn Wells  Service: E/M   PCP: Donzella Lauraine SAILOR, DO  DOB: [birth year]  DOS: 08/15/2023  Provider: Emmy MARLA Blanch, NP  MRN: [id number]  Delivery: Face-to-face  Specialty: Interventional Pain Management  Type: Established Patient  Setting: Ambulatory outpatient facility  Specialty designation: 09  Referring Prov.: Donzella Lauraine SAILOR, DO  Location: Outpatient office facility       History of present illness (HPI) [name], a 68 y.o. year old female, is here today because of her Primary osteoarthritis of left knee [M17.12]. [name] primary complain today is Leg Pain  Pertinent problems: [name] does not have any pertinent problems on file.  Pain Assessment: Severity of Chronic pain is reported as a 10-Worst pain ever/10. Location: Knee Right, Left/Radiates into bilateral legs. Onset: More than a month ago. Quality: Sharp. Timing: Intermittent. Modifying factor(s): Resting, Medication. Vitals:  height is 5' 5 (1.651 m) and weight is 200 lb (90.7 kg). Her temperature is 98.2 F (36.8 C). Her blood pressure is 131/54 (abnormal) and her pulse is 81. Her respiration is 18 and oxygen  saturation is 99%.  BMI: Estimated body mass index is 33.28 kg/m as calculated from the following:   Height as of this encounter: 5' 5 (1.651 m).   Weight as of this encounter: 200 lb (90.7 kg).  Last encounter: 05/18/2023 Last procedure: 06/15/2023  Reason for encounter: both, medication management and post-procedure evaluation and assessment. No change in medical history since last visit.  Patient's pain is at baseline.  Patient  continues multimodal pain regimen as prescribed.  States that it provides pain relief and improvement in functional status. She also had a left genicular nerve block on 03/09/2023 and is still endorsing 100% pain relief in her left knee pain.  The patient is experiencing a flareup of pain in the left knee and expressed interest in repeating the genicular nerve block.  [name] received Qutenza  treatment and reports significant pain relief following the procedure.  However, she continues to experience burning sensation and pain in her bilateral feet and expressed interest in repeating the Qutenza  treatment.  Procedure Type: Qutenza  Neurolysis #3  Laterality:  Bilateral Area treated: Feet Imaging Guidance: None Anesthesia/analgesia/anxiolysis/sedation: None required Medication (Right): Qutenza  (capsaicin  8%) topical system Medication (Left): Qutenza  (capsaicin  8%) topical system Date: 06/15/2023 Performed by: Wallie Sherry, MD Rationale (medical necessity): procedure needed and proper for the treatment of [name]'s medical symptoms and needs. Indication: Painful diabetic peripheral neuralgia (DPN) (ICD-10-CM:E11.40) severe enough to impact quality of life or function. 1. Chronic painful diabetic neuropathy (HCC)     NAS-11 Pain score:        Pre-procedure: 0-No pain/10        Post-procedure: 0-No pain/10  Post-Procedure Evaluation   Effectiveness:  Initial hour after procedure: 100 % . Subsequent 4-6 hours post-procedure: 100 % . Analgesia past initial 6 hours: 100 % . Ongoing improvement:  Analgesic:  [name] received Qutenza  treatment and reports significant pain relief following the procedure.  However, she continues to experience burning sensation and pain in her bilateral feet and expressed interest in repeating the Qutenza  treatment.  Function: [name] reports improvement in function ROM: [name] reports improvement  in ROM   Pharmacotherapy Assessment     Tramadol  (Ultram ) 50 mg tab 2 times per day as needed for her pain. MME=20 Monitoring: [place] PMP: PDMP reviewed during this encounter.       Pharmacotherapy: No side-effects or adverse reactions reported. Compliance: No problems identified. Effectiveness: Clinically acceptable.  Erlene Doyal SAUNDERS, [place]  08/15/2023 10:38 AM  Sign when Signing Visit Nursing Pain Medication Assessment:  Safety precautions to be maintained throughout the outpatient stay will include: orient to surroundings, keep bed in low position, maintain call bell within reach at all times, provide assistance with transfer out of bed and ambulation.  Medication Inspection Compliance: Pill count conducted under aseptic conditions, in front of the patient. Neither the pills nor the bottle was removed from the patient's sight at any time. Once count was completed pills were immediately returned to the patient in their original bottle.  Medication: Tramadol  (Ultram ) Pill/Patch Count: 7 of 30 pills/patches remain Pill/Patch Appearance: Markings consistent with prescribed medication Bottle Appearance: Standard pharmacy container. Clearly labeled. Filled Date: 06 / 06 / 2025 Last Medication intake:  Today    UDS:  Summary  Date Value Ref Range Status  10/25/2022 Note  Final    Comment:    ==================================================================== [employer] [facility] 13 (MW) ==================================================================== Test                             Result       Flag       Units  Drug Present and Declared for Prescription Verification   Tramadol                        >13889       EXPECTED   ng/mg creat   O-Desmethyltramadol            3197         EXPECTED   ng/mg creat   N-Desmethyltramadol            7950         EXPECTED   ng/mg creat    Source of tramadol  is a prescription medication. O-desmethyltramadol    and N-desmethyltramadol are expected metabolites of tramadol .  Drug Present not  Declared for Prescription Verification   Alcohol, Ethyl                 0.041        UNEXPECTED g/dL    Sources of ethyl alcohol include alcoholic beverages or as a    fermentation product of glucose; glucose is present in this specimen.    Interpret result with caution, as the presence of ethyl alcohol is    likely due, at least in part, to fermentation of glucose.  ==================================================================== Test                      Result    Flag   Units      Ref Range   Creatinine              36               mg/dL      >=79 ==================================================================== Declared Medications:  The flagging and interpretation on this report are based on the  following declared medications.  Unexpected results may arise from  inaccuracies in the declared medications.   **Note: The testing scope of this panel includes these medications:  Tramadol  (Ultram )   **Note: The testing scope of this panel does not include the  following reported medications:   Albuterol   Apixaban  (Eliquis )  Atorvastatin  (Lipitor)  Bupropion  (Wellbutrin  SR)  Calcitriol   Dulaglutide  (Trulicity )  Fluticasone  (Flonase )  Gabapentin  (Neurontin )  Helium  Hydroxyurea  (Hydrea )  Insulin  (NovoLog )  Loratadine  (Claritin )  Meclizine  (Antivert )  Oxygen   Paroxetine  (Paxil )  Torsemide  (Demadex )  Vitamin D2 (Drisdol ) ==================================================================== For clinical consultation, please call [phone]. ====================================================================     No results found for: CBDTHCR No results found for: D8THCCBX No results found for: D9THCCBX  ROS  Constitutional: Denies any fever or chills Gastrointestinal: No reported hemesis, hematochezia, vomiting, or acute GI distress Musculoskeletal: Bilateral leg pain, foot pain, left knee pain  Neurological: No reported episodes of acute onset  apraxia, aphasia, dysarthria, agnosia, amnesia, paralysis, loss of coordination, or loss of consciousness  Medication Review  Dulaglutide , Omnipod DASH Pods (Gen 4), Oxygen -Helium, PARoxetine , Vitamin D  (Ergocalciferol ), albuterol , apixaban , atorvastatin , buPROPion , calcium  carbonate, fluticasone , gabapentin , hydroxyurea , insulin  aspart, insulin  pump, loratadine , losartan , meclizine , optichamber diamond , torsemide , and traMADol   History Review  Allergy: Ms. Pinkstaff is allergic to codeine and ivp dye [iodinated contrast media]. Drug: Ms. Baines  reports no history of drug use. Alcohol:  reports no history of alcohol use. Tobacco:  reports that she quit smoking about 14 months ago. Her smoking use included cigarettes. She started smoking about 49 years ago. She has a 24.2 pack-year smoking history. She has never used smokeless tobacco. Social: Ms. Orebaugh  reports that she quit smoking about 14 months ago. Her smoking use included cigarettes. She started smoking about 49 years ago. She has a 24.2 pack-year smoking history. She has never used smokeless tobacco. She reports that she does not drink alcohol and does not use drugs. Medical:  has a past medical history of Acute ischemic left MCA stroke (HCC) (01/29/2015), Acute renal failure superimposed on stage 4 chronic kidney disease (HCC) (01/29/2015), Anemia of chronic renal failure, Anxiety, Arthritis, Complication of anesthesia, COPD (chronic obstructive pulmonary disease) (HCC), Depression, Dyspnea, ESRD (end stage renal disease) (HCC), Headache, HFrEF (heart failure with reduced ejection fraction) (HCC), HTN (hypertension), Leukocytosis, Long term current use of anticoagulant, Long term current use of antithrombotics/antiplatelets, NICM (nonischemic cardiomyopathy) (HCC), Obesity, On supplemental oxygen  by nasal cannula, Osteoporosis, Peripheral vascular disease (HCC), and Type 2 diabetes mellitus treated with insulin  (HCC). Surgical: Ms. Roppolo  has  a past surgical history that includes Abdominal hysterectomy; Tubal ligation (02/09/1976); Cesarean section (02/09/1976); Knee arthroscopy (Left, 02/09/2003); Ulnar nerve transposition (02/08/2006); Bilateral salpingoophorectomy (02/08/1998); Ankle fracture surgery (Left, 02/09/2000); TEE without cardioversion (N/A, 01/31/2015); Cardiac catheterization (N/A, 03/25/2015); Cardiac catheterization (N/A, 08/05/2015); Esophagogastroduodenoscopy (egd) with propofol  (N/A, 09/22/2016); Colonoscopy with propofol  (N/A, 09/22/2016); Esophagogastroduodenoscopy (egd) with propofol  (N/A, 12/08/2016); Lower Extremity Angiography (Left, 08/12/2021); TEMPORARY DIALYSIS CATHETER (N/A, 08/14/2021); Fracture surgery; Colonoscopy with propofol  (N/A, 02/15/2022); Esophagogastroduodenoscopy (N/A, 02/15/2022); Givens capsule study (N/A, 04/19/2022); AV fistula placement (Left, 06/18/2022); Insertion hybrid anteriovenous gortex graft (06/18/2022); Esophagogastroduodenoscopy (egd) with propofol  (N/A, 10/12/2022); Givens capsule study (N/A, 10/12/2022); Facial laceration repair (N/A, 12/27/2022); PERIPHERAL VASCULAR THROMBECTOMY (Left, 01/19/2023); and DIALYSIS/PERMA CATHETER INSERTION (N/A, 01/19/2023). Family: family history includes Alcohol abuse in her paternal aunt; Aneurysm in her father; Asthma in her mother; COPD in her brother; Cancer in her maternal grandmother; Diabetes in her brother and mother; Heart disease in her father and mother.  Laboratory Chemistry Profile   Renal Lab Results  Component Value Date   BUN 20 06/29/2023   CREATININE 2.26 (H) 06/29/2023  LABCREA 3.7 04/22/2022   BCR 15 12/04/2021   GFRAA 33 (L) 04/23/2019   GFRNONAA 23 (L) 06/29/2023    Hepatic Lab Results  Component Value Date   AST 29 06/29/2023   ALT 22 06/29/2023   ALBUMIN  3.5 06/29/2023   ALKPHOS 75 06/29/2023    Electrolytes Lab Results  Component Value Date   NA 132 (L) 06/29/2023   K 3.8 06/29/2023   CL 93 (L) 06/29/2023   CALCIUM  8.5  (L) 06/29/2023   MG 1.8 12/28/2022   PHOS 3.2 12/31/2022    Bone No results found for: VD25OH, [id number], CI6874NY7, CI7874NY7, 25OHVITD1, 25OHVITD2, 25OHVITD3, TESTOFREE, TESTOSTERONE  Inflammation (CRP: Acute Phase) (ESR: Chronic Phase) Lab Results  Component Value Date   ESRSEDRATE 23 10/20/2020   LATICACIDVEN 1.4 05/31/2020         Note: Above Lab results reviewed.  Recent Imaging Review  CT CHEST LUNG CA SCREEN LOW DOSE W/O CM CLINICAL DATA:  147 pack-year smoking history/current smoker  EXAM: CT CHEST WITHOUT CONTRAST LOW-DOSE FOR LUNG CANCER SCREENING  TECHNIQUE: Multidetector CT imaging of the chest was performed following the standard protocol without IV contrast.  RADIATION DOSE REDUCTION: This exam was performed according to the departmental dose-optimization program which includes automated exposure control, adjustment of the mA and/or kV according to patient size and/or use of iterative reconstruction technique.  COMPARISON:  04/26/2017  FINDINGS: Cardiovascular: Bovine arch. Aortic and branch vessel atherosclerosis. Mild cardiomegaly. Lad and right coronary artery calcification. Tortuous thoracic aorta. Left hemidiaphragm elevation.  Mediastinum/Nodes: No mediastinal or hilar adenopathy, given limitations of unenhanced CT.  Lungs/Pleura: No pleural fluid. Mild centrilobular emphysema. Volume loss in the left lower lobe. No change in pulmonary nodules of maximally 4.0 mm.  Upper Abdomen: Nonspecific caudate lobe enlargement. Normal imaged portions of the spleen, stomach, pancreas, gallbladder, adrenal glands, left kidney.  Musculoskeletal: Loop recorder in the anterior chest wall. Moderate T12 compression deformity is new since the prior but present on the MRI of 01/10/2020.  IMPRESSION: Lung-RADS 2, benign appearance or behavior. Continue annual screening with low-dose chest CT without contrast in 12 months.  Aortic  Atherosclerosis (ICD10-I70.0) and Emphysema (ICD10-J43.9). Coronary artery atherosclerosis.  Electronically Signed   By: Rockey Kilts M.D.   On: 07/08/2023 09:41 Note: Reviewed        Physical Exam  Vitals: BP (!) 131/54   Pulse 81   Temp 98.2 F (36.8 C)   Resp 18   Ht 5' 5 (1.651 m)   Wt 200 lb (90.7 kg)   SpO2 99%   BMI 33.28 kg/m  BMI: Estimated body mass index is 33.28 kg/m as calculated from the following:   Height as of this encounter: 5' 5 (1.651 m).   Weight as of this encounter: 200 lb (90.7 kg). Ideal: Ideal body weight: 57 kg (125 lb 10.6 oz) Adjusted ideal body weight: 70.5 kg (155 lb 6.4 oz) General appearance: Well nourished, well developed, and well hydrated. In no apparent acute distress Mental status: Alert, oriented x 3 (person, place, & time)       Respiratory: No evidence of acute respiratory distress Eyes: PERLA  Assessment   Diagnosis Status  1. Primary osteoarthritis of left knee   2. Chronic pain of left knee   3. Chronic painful diabetic neuropathy (HCC)   4. Chronic pain syndrome   5. Compression fracture of body of thoracic vertebra (HCC)   6. Compression fracture of L1 vertebra, sequela   7. Type 2 diabetes mellitus with  moderate nonproliferative retinopathy and macular edema, with long-term current use of insulin , unspecified laterality (HCC)    Having a Flare-up Having a Flare-up Controlled   Updated Problems: No problems updated.  Plan of Care  Problem-specific:  Assessment and Plan  We will continue on current medication regimen.  Prescribing drug monitoring (PDMP) reviewed; findings consistent with the use of prescribed medication and no evidence of narcotic misuse or abuse.  Schedule follow-up in 90 days for medication management.   Ms. DANNE SCARDINA has a current medication list which includes the following long-term medication(s): albuterol , apixaban , atorvastatin , bupropion , fluticasone , insulin  pump, loratadine ,  paroxetine , optichamber diamond , and torsemide .  Pharmacotherapy (Medications Ordered): Meds ordered this encounter  Medications   traMADol  (ULTRAM ) 50 MG tablet    Sig: Take 1 tablet (50 mg total) by mouth 2 (two) times daily as needed.    Dispense:  60 tablet    Refill:  2   Orders:  No orders of the defined types were placed in this encounter.       Return in about 3 months (around 11/15/2023) for (F2F), (MM), Emmy Blanch NP.    Recent Visits Date Type Provider Dept  06/15/23 Procedure visit Marcelino Nurse, MD [facility]  05/18/23 Office Visit Marcelino Nurse, MD [facility]  Showing recent visits within past 90 days and meeting all other requirements Today's Visits Date Type Provider Dept  08/15/23 Office Visit Frederick Marro K, NP [facility]  Showing today's visits and meeting all other requirements Future Appointments Date Type Provider Dept  08/31/23 Appointment Marcelino Nurse, MD [facility]  Showing future appointments within next 90 days and meeting all other requirements  I discussed the assessment and treatment plan with the patient. The patient was provided an opportunity to ask questions and all were answered. The patient agreed with the plan and demonstrated an understanding of the instructions.  Patient advised to call back or seek an in-person evaluation if the symptoms or condition worsens.  Duration of encounter: 30 minutes.  Total time on encounter, as per AMA guidelines included both the face-to-face and non-face-to-face time personally spent by the physician and/or other qualified health care professional(s) on the day of the encounter (includes time in activities that require the physician or other qualified health care professional and does not include time in activities normally performed by clinical staff). Physician's time may include the following activities when performed: Preparing to see the patient (e.g.,  pre-charting review of records, searching for previously ordered imaging, lab work, and nerve conduction tests) Review of prior analgesic pharmacotherapies. Reviewing PMP Interpreting ordered tests (e.g., lab work, imaging, nerve conduction tests) Performing post-procedure evaluations, including interpretation of diagnostic procedures Obtaining and/or reviewing separately obtained history Performing a medically appropriate examination and/or evaluation Counseling and educating the patient/family/caregiver Ordering medications, tests, or procedures Referring and communicating with other health care professionals (when not separately reported) Documenting clinical information in the electronic or other health record Independently interpreting results (not separately reported) and communicating results to the patient/ family/caregiver Care coordination (not separately reported)  Note by: Chibuike Fleek K Chardae Mulkern, NP (TTS and AI technology used. I apologize for any typographical errors that were not detected and corrected.) Date: 08/15/2023; Time: 11:20 AM

## 2023-08-15 NOTE — Progress Notes (Signed)
 Nursing Pain Medication Assessment:  Safety precautions to be maintained throughout the outpatient stay will include: orient to surroundings, keep bed in low position, maintain call bell within reach at all times, provide assistance with transfer out of bed and ambulation.  Medication Inspection Compliance: Pill count conducted under aseptic conditions, in front of the patient. Neither the pills nor the bottle was removed from the patient's sight at any time. Once count was completed pills were immediately returned to the patient in their original bottle.  Medication: Tramadol  (Ultram ) Pill/Patch Count: 7 of 30 pills/patches remain Pill/Patch Appearance: Markings consistent with prescribed medication Bottle Appearance: Standard pharmacy container. Clearly labeled. Filled Date: 06 / 06 / 2025 Last Medication intake:  Today

## 2023-08-16 DIAGNOSIS — Z992 Dependence on renal dialysis: Secondary | ICD-10-CM | POA: Diagnosis not present

## 2023-08-16 DIAGNOSIS — N186 End stage renal disease: Secondary | ICD-10-CM | POA: Diagnosis not present

## 2023-08-16 DIAGNOSIS — D509 Iron deficiency anemia, unspecified: Secondary | ICD-10-CM | POA: Diagnosis not present

## 2023-08-16 DIAGNOSIS — N2581 Secondary hyperparathyroidism of renal origin: Secondary | ICD-10-CM | POA: Diagnosis not present

## 2023-08-16 DIAGNOSIS — D631 Anemia in chronic kidney disease: Secondary | ICD-10-CM | POA: Diagnosis not present

## 2023-08-18 DIAGNOSIS — D631 Anemia in chronic kidney disease: Secondary | ICD-10-CM | POA: Diagnosis not present

## 2023-08-18 DIAGNOSIS — D509 Iron deficiency anemia, unspecified: Secondary | ICD-10-CM | POA: Diagnosis not present

## 2023-08-18 DIAGNOSIS — Z992 Dependence on renal dialysis: Secondary | ICD-10-CM | POA: Diagnosis not present

## 2023-08-18 DIAGNOSIS — N2581 Secondary hyperparathyroidism of renal origin: Secondary | ICD-10-CM | POA: Diagnosis not present

## 2023-08-18 DIAGNOSIS — N186 End stage renal disease: Secondary | ICD-10-CM | POA: Diagnosis not present

## 2023-08-19 DIAGNOSIS — E1129 Type 2 diabetes mellitus with other diabetic kidney complication: Secondary | ICD-10-CM | POA: Diagnosis not present

## 2023-08-19 DIAGNOSIS — E1142 Type 2 diabetes mellitus with diabetic polyneuropathy: Secondary | ICD-10-CM | POA: Diagnosis not present

## 2023-08-19 DIAGNOSIS — E1121 Type 2 diabetes mellitus with diabetic nephropathy: Secondary | ICD-10-CM | POA: Diagnosis not present

## 2023-08-19 DIAGNOSIS — R809 Proteinuria, unspecified: Secondary | ICD-10-CM | POA: Diagnosis not present

## 2023-08-19 DIAGNOSIS — M810 Age-related osteoporosis without current pathological fracture: Secondary | ICD-10-CM | POA: Diagnosis not present

## 2023-08-19 DIAGNOSIS — Z794 Long term (current) use of insulin: Secondary | ICD-10-CM | POA: Diagnosis not present

## 2023-08-19 DIAGNOSIS — E1165 Type 2 diabetes mellitus with hyperglycemia: Secondary | ICD-10-CM | POA: Diagnosis not present

## 2023-08-19 DIAGNOSIS — Z9641 Presence of insulin pump (external) (internal): Secondary | ICD-10-CM | POA: Diagnosis not present

## 2023-08-19 DIAGNOSIS — E113393 Type 2 diabetes mellitus with moderate nonproliferative diabetic retinopathy without macular edema, bilateral: Secondary | ICD-10-CM | POA: Diagnosis not present

## 2023-08-19 DIAGNOSIS — E1159 Type 2 diabetes mellitus with other circulatory complications: Secondary | ICD-10-CM | POA: Diagnosis not present

## 2023-08-19 DIAGNOSIS — N186 End stage renal disease: Secondary | ICD-10-CM | POA: Diagnosis not present

## 2023-08-19 DIAGNOSIS — E1122 Type 2 diabetes mellitus with diabetic chronic kidney disease: Secondary | ICD-10-CM | POA: Diagnosis not present

## 2023-08-20 DIAGNOSIS — D631 Anemia in chronic kidney disease: Secondary | ICD-10-CM | POA: Diagnosis not present

## 2023-08-20 DIAGNOSIS — Z992 Dependence on renal dialysis: Secondary | ICD-10-CM | POA: Diagnosis not present

## 2023-08-20 DIAGNOSIS — D509 Iron deficiency anemia, unspecified: Secondary | ICD-10-CM | POA: Diagnosis not present

## 2023-08-20 DIAGNOSIS — N186 End stage renal disease: Secondary | ICD-10-CM | POA: Diagnosis not present

## 2023-08-20 DIAGNOSIS — N2581 Secondary hyperparathyroidism of renal origin: Secondary | ICD-10-CM | POA: Diagnosis not present

## 2023-08-23 DIAGNOSIS — Z992 Dependence on renal dialysis: Secondary | ICD-10-CM | POA: Diagnosis not present

## 2023-08-23 DIAGNOSIS — D631 Anemia in chronic kidney disease: Secondary | ICD-10-CM | POA: Diagnosis not present

## 2023-08-23 DIAGNOSIS — N2581 Secondary hyperparathyroidism of renal origin: Secondary | ICD-10-CM | POA: Diagnosis not present

## 2023-08-23 DIAGNOSIS — N186 End stage renal disease: Secondary | ICD-10-CM | POA: Diagnosis not present

## 2023-08-23 DIAGNOSIS — D509 Iron deficiency anemia, unspecified: Secondary | ICD-10-CM | POA: Diagnosis not present

## 2023-08-25 DIAGNOSIS — Z992 Dependence on renal dialysis: Secondary | ICD-10-CM | POA: Diagnosis not present

## 2023-08-25 DIAGNOSIS — D631 Anemia in chronic kidney disease: Secondary | ICD-10-CM | POA: Diagnosis not present

## 2023-08-25 DIAGNOSIS — N2581 Secondary hyperparathyroidism of renal origin: Secondary | ICD-10-CM | POA: Diagnosis not present

## 2023-08-25 DIAGNOSIS — N186 End stage renal disease: Secondary | ICD-10-CM | POA: Diagnosis not present

## 2023-08-25 DIAGNOSIS — D509 Iron deficiency anemia, unspecified: Secondary | ICD-10-CM | POA: Diagnosis not present

## 2023-08-30 DIAGNOSIS — D631 Anemia in chronic kidney disease: Secondary | ICD-10-CM | POA: Diagnosis not present

## 2023-08-30 DIAGNOSIS — N186 End stage renal disease: Secondary | ICD-10-CM | POA: Diagnosis not present

## 2023-08-30 DIAGNOSIS — N2581 Secondary hyperparathyroidism of renal origin: Secondary | ICD-10-CM | POA: Diagnosis not present

## 2023-08-30 DIAGNOSIS — Z992 Dependence on renal dialysis: Secondary | ICD-10-CM | POA: Diagnosis not present

## 2023-08-30 DIAGNOSIS — D509 Iron deficiency anemia, unspecified: Secondary | ICD-10-CM | POA: Diagnosis not present

## 2023-08-31 ENCOUNTER — Ambulatory Visit
Attending: Student in an Organized Health Care Education/Training Program | Admitting: Student in an Organized Health Care Education/Training Program

## 2023-08-31 ENCOUNTER — Ambulatory Visit: Payer: Self-pay

## 2023-08-31 DIAGNOSIS — W540XXA Bitten by dog, initial encounter: Secondary | ICD-10-CM | POA: Diagnosis not present

## 2023-08-31 DIAGNOSIS — M1712 Unilateral primary osteoarthritis, left knee: Secondary | ICD-10-CM

## 2023-08-31 DIAGNOSIS — S61251A Open bite of left index finger without damage to nail, initial encounter: Secondary | ICD-10-CM | POA: Diagnosis not present

## 2023-08-31 MED ORDER — ROPIVACAINE HCL 2 MG/ML IJ SOLN
INTRAMUSCULAR | Status: AC
  Filled 2023-08-31: qty 20

## 2023-08-31 MED ORDER — LIDOCAINE HCL 2 % IJ SOLN
INTRAMUSCULAR | Status: AC
  Filled 2023-08-31: qty 20

## 2023-08-31 MED ORDER — DEXAMETHASONE SODIUM PHOSPHATE 10 MG/ML IJ SOLN
INTRAMUSCULAR | Status: AC
  Filled 2023-08-31: qty 2

## 2023-08-31 NOTE — Progress Notes (Signed)
 Patient sustained a dog bite and there is concern of soft tissue infection.  We will reschedule procedure until after she has been evaluated for her dog bite.

## 2023-08-31 NOTE — Telephone Encounter (Signed)
 FYI Only or Action Required?: FYI only for provider.  Patient was last seen in primary care on 05/11/2023 by Donzella Lauraine SAILOR, DO.  Called Nurse Triage reporting Animal Bite.  Symptoms began yesterday.  Interventions attempted: Ice/heat application.  Symptoms are: gradually worsening.  Triage Disposition: See HCP Within 4 Hours (Or PCP Triage)  Patient/caregiver understands and will follow disposition?: Yes  Reason for Disposition  [1] Puncture wound or small cut AND [2] weak immune system (e.g., HIV positive, cancer chemo, splenectomy, organ transplant, chronic steroids)  Answer Assessment - Initial Assessment Questions 2 days ago bitten by dog on left hand/knuckle/finger. Bitten by patient's dog, UTD on rabies. Now reporting severe throbbing, red and edema. Denies discharge. Patient was not seen by a provider after being bitten. No in office availability, patient states she will go to UC. This RN advised patient of importance of being seen today d/t possible infection as well as her being diabetic and patient verbalized understanding.  1. APPEARANCE What does it look like?  (e.g., abrasion, bruise, puncture)      Red, edematous  2. SIZE: How big is the bite? (e.g., inches, cm; or compare to size of coin, pea, grape, ping pong ball)      From knuckle to finger - dog is small  3. LOCATION: Where is the bite located?      Left hand/knuckle/finger  4. ONSET: When did the bite happen? (e.g., minutes, hours ago)      08/30/23  5. ANIMAL: What type of animal caused the bite? Is the injury from a bite or a claw? If the animal is a dog or a cat, ask: Was it a pet or a stray? Was it acting ill or behaving strangely?     Small dog, states he was angry  6. RABIES VACCINE: For dog or cat bites, ask: Do you know if the pet is vaccinated against rabies?  (e.g., yes, no, overdue for rabies shot, unknown)     Yes, up to date per patient (owner)  Protocols used: Animal  Bite-A-AH Copied from CRM [id number]. Topic: Clinical - Red Word Triage >> Aug 31, 2023 11:43 AM Precious C wrote: [facility] that prompted transfer to Nurse Triage: ANIMAL BITES  Patient called in stating she had a dog bite on Monday. She reports the pain is 10 /10 and describes it as a throbbing pain. Patient also mentioned she believes the bite is infected.

## 2023-09-01 DIAGNOSIS — Z992 Dependence on renal dialysis: Secondary | ICD-10-CM | POA: Diagnosis not present

## 2023-09-01 DIAGNOSIS — D509 Iron deficiency anemia, unspecified: Secondary | ICD-10-CM | POA: Diagnosis not present

## 2023-09-01 DIAGNOSIS — N186 End stage renal disease: Secondary | ICD-10-CM | POA: Diagnosis not present

## 2023-09-01 DIAGNOSIS — D631 Anemia in chronic kidney disease: Secondary | ICD-10-CM | POA: Diagnosis not present

## 2023-09-01 DIAGNOSIS — N2581 Secondary hyperparathyroidism of renal origin: Secondary | ICD-10-CM | POA: Diagnosis not present

## 2023-09-02 ENCOUNTER — Other Ambulatory Visit: Payer: Self-pay

## 2023-09-02 ENCOUNTER — Encounter: Payer: Self-pay | Admitting: Emergency Medicine

## 2023-09-02 ENCOUNTER — Emergency Department
Admission: EM | Admit: 2023-09-02 | Discharge: 2023-09-02 | Disposition: A | Discharge status: Home / Self Care | Attending: Emergency Medicine | Admitting: Emergency Medicine

## 2023-09-02 ENCOUNTER — Other Ambulatory Visit: Payer: Self-pay | Admitting: Cardiology

## 2023-09-02 DIAGNOSIS — Z992 Dependence on renal dialysis: Secondary | ICD-10-CM | POA: Diagnosis not present

## 2023-09-02 DIAGNOSIS — N186 End stage renal disease: Secondary | ICD-10-CM | POA: Insufficient documentation

## 2023-09-02 DIAGNOSIS — J449 Chronic obstructive pulmonary disease, unspecified: Secondary | ICD-10-CM | POA: Insufficient documentation

## 2023-09-02 DIAGNOSIS — I120 Hypertensive chronic kidney disease with stage 5 chronic kidney disease or end stage renal disease: Secondary | ICD-10-CM | POA: Insufficient documentation

## 2023-09-02 DIAGNOSIS — I10 Essential (primary) hypertension: Secondary | ICD-10-CM | POA: Diagnosis not present

## 2023-09-02 DIAGNOSIS — R7989 Other specified abnormal findings of blood chemistry: Secondary | ICD-10-CM | POA: Diagnosis present

## 2023-09-02 DIAGNOSIS — E1122 Type 2 diabetes mellitus with diabetic chronic kidney disease: Secondary | ICD-10-CM | POA: Insufficient documentation

## 2023-09-02 LAB — BASIC METABOLIC PANEL WITH GFR
Anion gap: 14 (ref 5–15)
BUN: 48 mg/dL — ABNORMAL HIGH (ref 8–23)
CO2: 24 mmol/L (ref 22–32)
Calcium: 6.9 mg/dL — ABNORMAL LOW (ref 8.9–10.3)
Chloride: 104 mmol/L (ref 98–111)
Creatinine, Ser: 4.32 mg/dL — ABNORMAL HIGH (ref 0.44–1.00)
GFR, Estimated: 11 mL/min — ABNORMAL LOW (ref >60)
Glucose, Bld: 302 mg/dL — ABNORMAL HIGH (ref 70–99)
Potassium: 3.6 mmol/L (ref 3.5–5.1)
Sodium: 142 mmol/L (ref 135–145)

## 2023-09-02 LAB — CBC
HCT: 33.6 % — ABNORMAL LOW (ref 36.0–46.0)
Hemoglobin: 10.3 g/dL — ABNORMAL LOW (ref 12.0–15.0)
MCH: 31.5 pg (ref 26.0–34.0)
MCHC: 30.7 g/dL (ref 30.0–36.0)
MCV: 102.8 fL — ABNORMAL HIGH (ref 80.0–100.0)
Platelets: 576 K/uL — ABNORMAL HIGH (ref 150–400)
RBC: 3.27 MIL/uL — ABNORMAL LOW (ref 3.87–5.11)
RDW: 17.3 % — ABNORMAL HIGH (ref 11.5–15.5)
WBC: 8.5 K/uL (ref 4.0–10.5)
nRBC: 0 % (ref 0.0–0.2)

## 2023-09-02 LAB — MAGNESIUM: Magnesium: 2.3 mg/dL (ref 1.7–2.4)

## 2023-09-02 MED ORDER — CALCIUM GLUCONATE-NACL 1-0.675 GM/50ML-% IV SOLN
1.0000 g | Freq: Once | INTRAVENOUS | Status: AC
  Administered 2023-09-02: 1000 mg via INTRAVENOUS
  Filled 2023-09-02: qty 50

## 2023-09-02 NOTE — ED Triage Notes (Signed)
 Pt in via POV, reports having dialysis yesterday, receiving call today about Calcium  level being 5.5.  Advised to be seen for redraw.  Denies any new complaints.  Vitals WDL.

## 2023-09-02 NOTE — ED Notes (Signed)
 Will discharge when pt medication finishes

## 2023-09-02 NOTE — ED Provider Notes (Signed)
 [facility] Provider Note    Event Date/Time   First MD Initiated Contact with Patient 09/02/23 1929     (approximate)   History   Chief Complaint Abnormal Lab   HPI  Jocelyn Wells is a 68 y.o. female with past medical history of hypertension, diabetes, DVT, COPD, and ESRD on HD (TTS) who presents to the ED complaining of abnormal labs.  Patient reports that she had routine dialysis yesterday, was subsequently told that her calcium  level was very low.  She states that she has been feeling fine and denies any paresthesias, cramps, nausea, or vomiting.     Physical Exam   Triage Vital Signs: ED Triage Vitals  Encounter Vitals Group     BP 09/02/23 1721 (!) 122/53     Girls Systolic BP Percentile --      Girls Diastolic BP Percentile --      Boys Systolic BP Percentile --      Boys Diastolic BP Percentile --      Pulse Rate 09/02/23 1721 86     Resp 09/02/23 1721 18     Temp 09/02/23 1721 98.7 F (37.1 C)     Temp Source 09/02/23 1721 Oral     SpO2 09/02/23 1721 92 %     Weight 09/02/23 1722 200 lb (90.7 kg)     Height 09/02/23 1722 5' 5 (1.651 m)     Head Circumference --      Peak Flow --      Pain Score 09/02/23 1722 5     Pain Loc --      Pain Education --      Exclude from Growth Chart --     Most recent vital signs: Vitals:   09/02/23 1721 09/02/23 2100  BP: (!) 122/53 125/63  Pulse: 86 100  Resp: 18 20  Temp: 98.7 F (37.1 C) 98.6 F (37 C)  SpO2: 92% 100%    Constitutional: Alert and oriented. Eyes: Conjunctivae are normal. Head: Atraumatic. Nose: No congestion/rhinnorhea. Mouth/Throat: Mucous membranes are moist.  Cardiovascular: Normal rate, regular rhythm. Grossly normal heart sounds.  2+ radial pulses bilaterally.  Left upper extremity AV fistula with palpable thrill. Respiratory: Normal respiratory effort.  No retractions. Lungs CTAB. Gastrointestinal: Soft and nontender. No distention. Musculoskeletal: No lower  extremity tenderness nor edema.  Neurologic:  Normal speech and language. No gross focal neurologic deficits are appreciated.    ED Results / Procedures / Treatments   Labs (all labs ordered are listed, but only abnormal results are displayed) Labs Reviewed  BASIC METABOLIC PANEL WITH GFR - Abnormal; Notable for the following components:      Result Value   Glucose, Bld 302 (*)    BUN 48 (*)    Creatinine, Ser 4.32 (*)    Calcium  6.9 (*)    GFR, Estimated 11 (*)    All other components within normal limits  CBC - Abnormal; Notable for the following components:   RBC 3.27 (*)    Hemoglobin 10.3 (*)    HCT 33.6 (*)    MCV 102.8 (*)    RDW 17.3 (*)    Platelets 576 (*)    All other components within normal limits  MAGNESIUM      EKG  ED ECG REPORT I, Carlin Palin, the attending physician, personally viewed and interpreted this ECG.   Date: 09/02/2023  EKG Time: 17:42  Rate: 86  Rhythm: normal sinus rhythm  Axis: LAD  Intervals:right bundle  branch block and left anterior fascicular block  ST&T Change: None  PROCEDURES:  Critical Care performed: No  Procedures   MEDICATIONS ORDERED IN ED: Medications  calcium  gluconate 1 g/ 50 mL sodium chloride  IVPB (1,000 mg Intravenous New Bag/Given 09/02/23 2055)     IMPRESSION / MDM / ASSESSMENT AND PLAN / ED COURSE  I reviewed the triage vital signs and the nursing notes.                              68 y.o. female with past medical history of hypertension, diabetes, DVT, COPD, and ESRD on HD who presents to the ED with abnormal labs and hypocalcemia seen yesterday at dialysis.  Patient's presentation is most consistent with acute presentation with potential threat to life or bodily function.  Differential diagnosis includes, but is not limited to, hypocalcemia, hypokalemia, hypomagnesemia.  Patient nontoxic-appearing and in no acute distress, vital signs are unremarkable.  She states that she feels well and denies  any complaints, does not seem to have any symptoms of hypocalcemia.  Labs show a calcium  level of 6.9 with some hyperglycemia, no other electrolyte abnormality noted and magnesium  level within normal limits.  No significant anemia or leukocytosis noted, EKG shows bifascicular block which is chronic along with some chronic prolonged QT.  We will give IV calcium , after which patient would be appropriate for discharge home with outpatient follow-up for recheck of her calcium  levels.  Magnesium  level within normal limits, patient appropriate for discharge home now that she has received supplemental calcium .  She was counseled to have this rechecked either with her PCP or with dialysis.  She was counseled to return to the ED for any new or worsening symptoms, patient agrees with plan.      FINAL CLINICAL IMPRESSION(S) / ED DIAGNOSES   Final diagnoses:  Hypocalcemia     Rx / DC Orders   ED Discharge Orders     None        Note:  This document was prepared using Dragon voice recognition software and may include unintentional dictation errors.   Willo Dunnings, MD 09/02/23 2117

## 2023-09-03 DIAGNOSIS — N186 End stage renal disease: Secondary | ICD-10-CM | POA: Diagnosis not present

## 2023-09-03 DIAGNOSIS — D509 Iron deficiency anemia, unspecified: Secondary | ICD-10-CM | POA: Diagnosis not present

## 2023-09-03 DIAGNOSIS — D631 Anemia in chronic kidney disease: Secondary | ICD-10-CM | POA: Diagnosis not present

## 2023-09-03 DIAGNOSIS — Z992 Dependence on renal dialysis: Secondary | ICD-10-CM | POA: Diagnosis not present

## 2023-09-03 DIAGNOSIS — N2581 Secondary hyperparathyroidism of renal origin: Secondary | ICD-10-CM | POA: Diagnosis not present

## 2023-09-06 DIAGNOSIS — N186 End stage renal disease: Secondary | ICD-10-CM | POA: Diagnosis not present

## 2023-09-06 DIAGNOSIS — Z992 Dependence on renal dialysis: Secondary | ICD-10-CM | POA: Diagnosis not present

## 2023-09-06 DIAGNOSIS — D631 Anemia in chronic kidney disease: Secondary | ICD-10-CM | POA: Diagnosis not present

## 2023-09-06 DIAGNOSIS — N2581 Secondary hyperparathyroidism of renal origin: Secondary | ICD-10-CM | POA: Diagnosis not present

## 2023-09-06 DIAGNOSIS — D509 Iron deficiency anemia, unspecified: Secondary | ICD-10-CM | POA: Diagnosis not present

## 2023-09-08 DIAGNOSIS — D509 Iron deficiency anemia, unspecified: Secondary | ICD-10-CM | POA: Diagnosis not present

## 2023-09-08 DIAGNOSIS — N2581 Secondary hyperparathyroidism of renal origin: Secondary | ICD-10-CM | POA: Diagnosis not present

## 2023-09-08 DIAGNOSIS — Z992 Dependence on renal dialysis: Secondary | ICD-10-CM | POA: Diagnosis not present

## 2023-09-08 DIAGNOSIS — D631 Anemia in chronic kidney disease: Secondary | ICD-10-CM | POA: Diagnosis not present

## 2023-09-08 DIAGNOSIS — N186 End stage renal disease: Secondary | ICD-10-CM | POA: Diagnosis not present

## 2023-09-10 DIAGNOSIS — Z992 Dependence on renal dialysis: Secondary | ICD-10-CM | POA: Diagnosis not present

## 2023-09-10 DIAGNOSIS — D509 Iron deficiency anemia, unspecified: Secondary | ICD-10-CM | POA: Diagnosis not present

## 2023-09-10 DIAGNOSIS — N250 Renal osteodystrophy: Secondary | ICD-10-CM | POA: Diagnosis not present

## 2023-09-10 DIAGNOSIS — D631 Anemia in chronic kidney disease: Secondary | ICD-10-CM | POA: Diagnosis not present

## 2023-09-10 DIAGNOSIS — N186 End stage renal disease: Secondary | ICD-10-CM | POA: Diagnosis not present

## 2023-09-10 DIAGNOSIS — N2581 Secondary hyperparathyroidism of renal origin: Secondary | ICD-10-CM | POA: Diagnosis not present

## 2023-09-12 ENCOUNTER — Ambulatory Visit: Admitting: Family Medicine

## 2023-09-12 ENCOUNTER — Encounter: Payer: Self-pay | Admitting: Family Medicine

## 2023-09-12 VITALS — BP 127/57 | HR 78 | Ht 65.0 in | Wt 215.4 lb

## 2023-09-12 DIAGNOSIS — N189 Chronic kidney disease, unspecified: Secondary | ICD-10-CM

## 2023-09-12 DIAGNOSIS — E1143 Type 2 diabetes mellitus with diabetic autonomic (poly)neuropathy: Secondary | ICD-10-CM | POA: Diagnosis not present

## 2023-09-12 DIAGNOSIS — E1169 Type 2 diabetes mellitus with other specified complication: Secondary | ICD-10-CM | POA: Diagnosis not present

## 2023-09-12 DIAGNOSIS — F3341 Major depressive disorder, recurrent, in partial remission: Secondary | ICD-10-CM | POA: Diagnosis not present

## 2023-09-12 DIAGNOSIS — H269 Unspecified cataract: Secondary | ICD-10-CM | POA: Diagnosis not present

## 2023-09-12 DIAGNOSIS — G894 Chronic pain syndrome: Secondary | ICD-10-CM | POA: Diagnosis not present

## 2023-09-12 DIAGNOSIS — Z794 Long term (current) use of insulin: Secondary | ICD-10-CM

## 2023-09-12 DIAGNOSIS — E785 Hyperlipidemia, unspecified: Secondary | ICD-10-CM

## 2023-09-12 DIAGNOSIS — R252 Cramp and spasm: Secondary | ICD-10-CM

## 2023-09-12 DIAGNOSIS — F339 Major depressive disorder, recurrent, unspecified: Secondary | ICD-10-CM

## 2023-09-12 MED ORDER — BUPROPION HCL ER (SR) 150 MG PO TB12: 150.0000 mg | ORAL_TABLET | Freq: Two times a day (BID) | ORAL | 3 refills | Status: AC

## 2023-09-12 MED ORDER — ATORVASTATIN CALCIUM 40 MG PO TABS: 40.0000 mg | ORAL_TABLET | Freq: Every day | ORAL | 3 refills | Status: DC

## 2023-09-12 MED ORDER — PAROXETINE HCL 40 MG PO TABS
40.0000 mg | ORAL_TABLET | Freq: Every day | ORAL | 3 refills | Status: AC
Start: 2023-09-12 — End: ?

## 2023-09-12 NOTE — Progress Notes (Signed)
 3     Established patient visit   Patient: Ronal DELENA Harvest   DOB: [birth year]   68 y.o. Female  MRN: [id number] Visit Date: 09/12/2023  Today's healthcare provider: LAURAINE LOISE BUOY, DO   Chief Complaint  Patient presents with   Medical Management of Chronic Issues    Pt reports taking her medications as prescribed. No symptoms or concerns to report   Subjective    HPI TEKEYAH SANTIAGO is a 68 year old female with history of hypertension, diabetes, DVT, COPD, and ESRD on HD (TTS) who presents for follow-up after an ER visit for hypocalcemia.  She was recently seen in the emergency room due to low calcium  levels identified by her dialysis nurse and was treated with an infusion. Since then, she has undergone dialysis four times without significant symptoms, although she experiences cramping in her left hand, particularly during dialysis sessions. The cramping is described as feeling 'funny' and is accompanied by coldness in the hand. No recheck of calcium  levels was done post-infusion at the ER.  She uses oxygen  at night, set at two liters, and has not experienced recent breathing difficulties or chest pain. However, she frequently experiences dizziness and lightheadedness, for which she occasionally takes meclizine .  She has diabetes and is under endocrinology care, using a Libre device for glucose monitoring and an insulin  pump. She reports hand tremors but has adapted to them. She is scheduled to see the cataract doctor tomorrow and thinks the procedure may be next Monday due to significant blurriness affecting her vision.  Her medication regimen includes torsemide , over-the-counter Claritin , tramadol , and paroxetine , which she feels is effective. She has not taken atorvastatin  recently due to a prescription refill issue. She also uses Qutenza  wraps for neuropathy, which she finds effective.  She reports leg pain and weakness, affecting her mobility and necessitating the use of a walker. She  drinks only water and sees a podiatrist for foot care. No issues with bruising or bleeding are reported.   She does have an appointment scheduled at [facility] on Wednesday.     Medications: Outpatient Medications Prior to Visit  Medication Sig   albuterol  (VENTOLIN  HFA) 108 (90 Base) MCG/ACT inhaler TAKE 2 PUFFS BY MOUTH EVERY 6 HOURS AS NEEDED FOR WHEEZE OR SHORTNESS OF BREATH (Patient taking differently: Inhale 2 puffs into the lungs every 6 (six) hours as needed for wheezing or shortness of breath.)   calcium  carbonate (TUMS EX) 750 MG chewable tablet Chew 1 tablet by mouth daily.   ELIQUIS  5 MG TABS tablet TAKE 1 TABLET TWICE A DAY   fluticasone  (FLONASE ) 50 MCG/ACT nasal spray Place 2 sprays into both nostrils daily. (Patient taking differently: Place 2 sprays into both nostrils as needed.)   gabapentin  (NEURONTIN ) 300 MG capsule Take 300 mg by mouth daily.   hydroxyurea  (HYDREA ) 500 MG capsule TAKE 1 CAPSULE BY MOUTH EVERY OTHER DAY. MAY TAKE WITH FOOD TO MINIMIZE GI SIDE EFFECTS.   insulin  aspart (NOVOLOG ) 100 UNIT/ML injection Insulin  pump   Insulin  Disposable Pump (OMNIPOD DASH PODS, GEN 4,) MISC USE 1 POD EACH EVERY 72    HOURS   Insulin  Human (INSULIN  PUMP) SOLN Per endocrinoloy   loratadine  (CLARITIN ) 10 MG tablet Take 1 tablet by mouth daily.   losartan  (COZAAR ) 50 MG tablet Take 50 mg by mouth daily.   meclizine  (ANTIVERT ) 12.5 MG tablet Take 1 tablet (12.5 mg total) by mouth 3 (three) times daily as needed for dizziness.  OXYGEN  Place 2 L into the nose continuous.   Spacer/Aero-Holding Chambers (OPTICHAMBER DIAMOND ) MISC Use with inhaler to ensure medication is delivered throughout lungs   torsemide  (DEMADEX ) 20 MG tablet TAKE 3 TABLETS BY MOUTH EVERY DAY (Patient taking differently: Take 40 mg by mouth daily. TAKE 2 TABLETS BY MOUTH EVERY DAY)   traMADol  (ULTRAM ) 50 MG tablet Take 1 tablet (50 mg total) by mouth 2 (two) times daily as needed.   Vitamin D ,  Ergocalciferol , (DRISDOL ) 1.25 MG (50000 UNIT) CAPS capsule Take 50,000 Units by mouth every 7 (seven) days. Sunday   [DISCONTINUED] atorvastatin  (LIPITOR) 40 MG tablet Take 1 tablet (40 mg total) by mouth daily.   [DISCONTINUED] buPROPion  (WELLBUTRIN  SR) 150 MG 12 hr tablet Take 1 tablet (150 mg total) by mouth 2 (two) times daily.   [DISCONTINUED] Dulaglutide  (TRULICITY ) 4.5 MG/0.5ML SOPN Inject 4.5 mg into the skin every 7 (seven) days. (Patient taking differently: Inject 4.5 mg into the skin every 7 (seven) days. Mondays)   [DISCONTINUED] PARoxetine  (PAXIL ) 40 MG tablet Take 1 tablet (40 mg total) by mouth daily.   No facility-administered medications prior to visit.        Objective    BP (!) 127/57 (BP Location: Right Arm, Patient Position: Sitting, Cuff Size: Normal)   Pulse 78   Ht 5' 5 (1.651 m)   Wt 215 lb 6.4 oz (97.7 kg)   SpO2 98%   BMI 35.84 kg/m     Physical Exam Constitutional:      Appearance: Normal appearance.  HENT:     Head: Normocephalic and atraumatic.  Eyes:     General: No scleral icterus.    Extraocular Movements: Extraocular movements intact.     Conjunctiva/sclera: Conjunctivae normal.  Cardiovascular:     Rate and Rhythm: Normal rate and regular rhythm.     Pulses: Normal pulses.     Heart sounds: Normal heart sounds.  Pulmonary:     Effort: Pulmonary effort is normal. No respiratory distress.     Breath sounds: Normal breath sounds.  Musculoskeletal:     Right lower leg: No edema.     Left lower leg: No edema.  Skin:    General: Skin is warm and dry.     Comments: Ruddy appearance of skin from ankles distally; significant varicosities noted to feet bilaterally.  Neurological:     Mental Status: She is alert and oriented to person, place, and time. Mental status is at baseline.  Psychiatric:        Mood and Affect: Mood normal.        Behavior: Behavior normal.      Results for orders placed or performed in visit on 09/12/23   Hemoglobin A1c  Result Value Ref Range   Hemoglobin A1C 15.2   Calcium , ionized  Result Value Ref Range   Calcium , Ion 3.4 (L) 4.5 - 5.6 mg/dL    Assessment & Plan    Hypocalcemia due to chronic kidney disease -     Calcium , ionized -     buPROPion  HCl ER (SR); Take 1 tablet (150 mg total) by mouth 2 (two) times daily.  Dispense: 180 tablet; Refill: 3  Cramping of hands -     Calcium , ionized  MDD (major depressive disorder), recurrent, in partial remission (HCC) -     PARoxetine  HCl; Take 1 tablet (40 mg total) by mouth daily.  Dispense: 90 tablet; Refill: 3  Type 2 diabetes mellitus with diabetic autonomic neuropathy, with long-term current  use of insulin  (HCC) -     Atorvastatin  Calcium ; Take 1 tablet (40 mg total) by mouth daily.  Dispense: 90 tablet; Refill: 3  Hyperlipidemia associated with type 2 diabetes mellitus (HCC) -     Atorvastatin  Calcium ; Take 1 tablet (40 mg total) by mouth daily.  Dispense: 90 tablet; Refill: 3  Cataract of both eyes, unspecified cataract type  Chronic pain syndrome     Hypocalcemia; cramping of hands Recent hypocalcemia episode managed with IV calcium . No symptoms reported. Left hand cramping possibly related to low calcium  or fistula. - Recheck calcium  levels.  Depression Chronic, stable.  Refilled paroxetine  today.  Diabetes Mellitus with diabetic autonomic neuropathy and long-term current use of insulin  Under endocrinology care. Using [place] system and insulin  pump. Hand tremors noted.  Neuropathy managed with Qutenza  patches.  Cataracts Significant visual impairment. Scheduled for specialist consultation and procedure. - Follow up with cataract specialist tomorrow.  Chronic Pain Chronic leg pain affecting mobility. Uses walker. Scheduled for injection with Dr. Marcelino. - Schedule follow-up for injection with Dr. Marcelino.  Hyperlipidemia Not taking atorvastatin  due to refill issue. Acknowledges need for medication. - Resend  atorvastatin  prescription.  General Health Maintenance On nocturnal oxygen  therapy. Blood pressure well-managed.    Follow-up - Discuss hand cramping with dialysis team as well.  Return in about 4 months (around 01/12/2024) for Chronic f/u, and for mAWV with AWV nurse.      I discussed the assessment and treatment plan with the patient  The patient was provided an opportunity to ask questions and all were answered. The patient agreed with the plan and demonstrated an understanding of the instructions.   The patient was advised to call back or seek an in-person evaluation if the symptoms worsen or if the condition fails to improve as anticipated.    LAURAINE LOISE BUOY, DO  [facility] [phone] (phone) [phone] (fax)  [facility]

## 2023-09-13 DIAGNOSIS — H2511 Age-related nuclear cataract, right eye: Secondary | ICD-10-CM | POA: Diagnosis not present

## 2023-09-13 DIAGNOSIS — H25013 Cortical age-related cataract, bilateral: Secondary | ICD-10-CM | POA: Diagnosis not present

## 2023-09-13 DIAGNOSIS — H2513 Age-related nuclear cataract, bilateral: Secondary | ICD-10-CM | POA: Diagnosis not present

## 2023-09-13 DIAGNOSIS — H25043 Posterior subcapsular polar age-related cataract, bilateral: Secondary | ICD-10-CM | POA: Diagnosis not present

## 2023-09-13 DIAGNOSIS — E113513 Type 2 diabetes mellitus with proliferative diabetic retinopathy with macular edema, bilateral: Secondary | ICD-10-CM | POA: Diagnosis not present

## 2023-09-13 DIAGNOSIS — H4313 Vitreous hemorrhage, bilateral: Secondary | ICD-10-CM | POA: Diagnosis not present

## 2023-09-13 LAB — CALCIUM, IONIZED: Calcium, Ion: 3.4 mg/dL — ABNORMAL LOW (ref 4.5–5.6)

## 2023-09-13 LAB — HM DIABETES EYE EXAM

## 2023-09-15 ENCOUNTER — Ambulatory Visit: Payer: Self-pay | Admitting: Family Medicine

## 2023-09-15 DIAGNOSIS — N250 Renal osteodystrophy: Secondary | ICD-10-CM | POA: Diagnosis not present

## 2023-09-15 DIAGNOSIS — N2581 Secondary hyperparathyroidism of renal origin: Secondary | ICD-10-CM | POA: Diagnosis not present

## 2023-09-15 DIAGNOSIS — Z992 Dependence on renal dialysis: Secondary | ICD-10-CM | POA: Diagnosis not present

## 2023-09-15 DIAGNOSIS — N186 End stage renal disease: Secondary | ICD-10-CM | POA: Diagnosis not present

## 2023-09-15 DIAGNOSIS — D509 Iron deficiency anemia, unspecified: Secondary | ICD-10-CM | POA: Diagnosis not present

## 2023-09-15 DIAGNOSIS — D631 Anemia in chronic kidney disease: Secondary | ICD-10-CM | POA: Diagnosis not present

## 2023-09-17 DIAGNOSIS — D509 Iron deficiency anemia, unspecified: Secondary | ICD-10-CM | POA: Diagnosis not present

## 2023-09-17 DIAGNOSIS — N186 End stage renal disease: Secondary | ICD-10-CM | POA: Diagnosis not present

## 2023-09-17 DIAGNOSIS — N250 Renal osteodystrophy: Secondary | ICD-10-CM | POA: Diagnosis not present

## 2023-09-17 DIAGNOSIS — Z992 Dependence on renal dialysis: Secondary | ICD-10-CM | POA: Diagnosis not present

## 2023-09-17 DIAGNOSIS — D631 Anemia in chronic kidney disease: Secondary | ICD-10-CM | POA: Diagnosis not present

## 2023-09-17 DIAGNOSIS — N2581 Secondary hyperparathyroidism of renal origin: Secondary | ICD-10-CM | POA: Diagnosis not present

## 2023-09-19 ENCOUNTER — Ambulatory Visit: Admitting: Student in an Organized Health Care Education/Training Program

## 2023-09-19 DIAGNOSIS — E113511 Type 2 diabetes mellitus with proliferative diabetic retinopathy with macular edema, right eye: Secondary | ICD-10-CM | POA: Diagnosis not present

## 2023-09-19 DIAGNOSIS — H2511 Age-related nuclear cataract, right eye: Secondary | ICD-10-CM | POA: Diagnosis not present

## 2023-09-19 DIAGNOSIS — H4311 Vitreous hemorrhage, right eye: Secondary | ICD-10-CM | POA: Diagnosis not present

## 2023-09-20 DIAGNOSIS — N250 Renal osteodystrophy: Secondary | ICD-10-CM | POA: Diagnosis not present

## 2023-09-20 DIAGNOSIS — Z992 Dependence on renal dialysis: Secondary | ICD-10-CM | POA: Diagnosis not present

## 2023-09-20 DIAGNOSIS — D509 Iron deficiency anemia, unspecified: Secondary | ICD-10-CM | POA: Diagnosis not present

## 2023-09-20 DIAGNOSIS — D631 Anemia in chronic kidney disease: Secondary | ICD-10-CM | POA: Diagnosis not present

## 2023-09-20 DIAGNOSIS — N186 End stage renal disease: Secondary | ICD-10-CM | POA: Diagnosis not present

## 2023-09-20 DIAGNOSIS — N2581 Secondary hyperparathyroidism of renal origin: Secondary | ICD-10-CM | POA: Diagnosis not present

## 2023-09-21 ENCOUNTER — Inpatient Hospital Stay: Attending: Oncology

## 2023-09-21 DIAGNOSIS — D631 Anemia in chronic kidney disease: Secondary | ICD-10-CM

## 2023-09-21 DIAGNOSIS — D500 Iron deficiency anemia secondary to blood loss (chronic): Secondary | ICD-10-CM

## 2023-09-21 DIAGNOSIS — D509 Iron deficiency anemia, unspecified: Secondary | ICD-10-CM | POA: Insufficient documentation

## 2023-09-21 LAB — CBC WITH DIFFERENTIAL (CANCER CENTER ONLY)
Abs Immature Granulocytes: 0.02 K/uL (ref 0.00–0.07)
Basophils Absolute: 0.1 K/uL (ref 0.0–0.1)
Basophils Relative: 1 %
Eosinophils Absolute: 0.3 K/uL (ref 0.0–0.5)
Eosinophils Relative: 4 %
HCT: 31.9 % — ABNORMAL LOW (ref 36.0–46.0)
Hemoglobin: 9.8 g/dL — ABNORMAL LOW (ref 12.0–15.0)
Immature Granulocytes: 0 %
Lymphocytes Relative: 11 %
Lymphs Abs: 0.9 K/uL (ref 0.7–4.0)
MCH: 32.3 pg (ref 26.0–34.0)
MCHC: 30.7 g/dL (ref 30.0–36.0)
MCV: 105.3 fL — ABNORMAL HIGH (ref 80.0–100.0)
Monocytes Absolute: 0.5 K/uL (ref 0.1–1.0)
Monocytes Relative: 6 %
Neutro Abs: 5.9 K/uL (ref 1.7–7.7)
Neutrophils Relative %: 78 %
Platelet Count: 436 K/uL — ABNORMAL HIGH (ref 150–400)
RBC: 3.03 MIL/uL — ABNORMAL LOW (ref 3.87–5.11)
RDW: 16 % — ABNORMAL HIGH (ref 11.5–15.5)
WBC Count: 7.7 K/uL (ref 4.0–10.5)
nRBC: 0 % (ref 0.0–0.2)

## 2023-09-21 LAB — IRON AND TIBC
Iron: 78 ug/dL (ref 28–170)
Saturation Ratios: 27 % (ref 10.4–31.8)
TIBC: 286 ug/dL (ref 250–450)
UIBC: 208 ug/dL

## 2023-09-21 LAB — FERRITIN: Ferritin: 199 ng/mL (ref 11–307)

## 2023-09-22 ENCOUNTER — Other Ambulatory Visit

## 2023-09-22 DIAGNOSIS — N2581 Secondary hyperparathyroidism of renal origin: Secondary | ICD-10-CM | POA: Diagnosis not present

## 2023-09-22 DIAGNOSIS — N186 End stage renal disease: Secondary | ICD-10-CM | POA: Diagnosis not present

## 2023-09-22 DIAGNOSIS — N250 Renal osteodystrophy: Secondary | ICD-10-CM | POA: Diagnosis not present

## 2023-09-22 DIAGNOSIS — D631 Anemia in chronic kidney disease: Secondary | ICD-10-CM | POA: Diagnosis not present

## 2023-09-22 DIAGNOSIS — Z992 Dependence on renal dialysis: Secondary | ICD-10-CM | POA: Diagnosis not present

## 2023-09-22 DIAGNOSIS — D509 Iron deficiency anemia, unspecified: Secondary | ICD-10-CM | POA: Diagnosis not present

## 2023-09-23 DIAGNOSIS — I509 Heart failure, unspecified: Secondary | ICD-10-CM | POA: Diagnosis not present

## 2023-09-23 DIAGNOSIS — D631 Anemia in chronic kidney disease: Secondary | ICD-10-CM | POA: Diagnosis not present

## 2023-09-23 DIAGNOSIS — M1030 Gout due to renal impairment, unspecified site: Secondary | ICD-10-CM | POA: Diagnosis not present

## 2023-09-23 DIAGNOSIS — T82898A Other specified complication of vascular prosthetic devices, implants and grafts, initial encounter: Secondary | ICD-10-CM | POA: Diagnosis not present

## 2023-09-23 DIAGNOSIS — Z794 Long term (current) use of insulin: Secondary | ICD-10-CM | POA: Diagnosis not present

## 2023-09-23 DIAGNOSIS — T82838A Hemorrhage of vascular prosthetic devices, implants and grafts, initial encounter: Secondary | ICD-10-CM | POA: Diagnosis not present

## 2023-09-23 DIAGNOSIS — I132 Hypertensive heart and chronic kidney disease with heart failure and with stage 5 chronic kidney disease, or end stage renal disease: Secondary | ICD-10-CM | POA: Diagnosis not present

## 2023-09-23 DIAGNOSIS — E1122 Type 2 diabetes mellitus with diabetic chronic kidney disease: Secondary | ICD-10-CM | POA: Diagnosis not present

## 2023-09-23 DIAGNOSIS — Z992 Dependence on renal dialysis: Secondary | ICD-10-CM | POA: Diagnosis not present

## 2023-09-23 DIAGNOSIS — N186 End stage renal disease: Secondary | ICD-10-CM | POA: Diagnosis not present

## 2023-09-23 DIAGNOSIS — J449 Chronic obstructive pulmonary disease, unspecified: Secondary | ICD-10-CM | POA: Diagnosis not present

## 2023-09-27 DIAGNOSIS — N2581 Secondary hyperparathyroidism of renal origin: Secondary | ICD-10-CM | POA: Diagnosis not present

## 2023-09-27 DIAGNOSIS — D631 Anemia in chronic kidney disease: Secondary | ICD-10-CM | POA: Diagnosis not present

## 2023-09-27 DIAGNOSIS — Z992 Dependence on renal dialysis: Secondary | ICD-10-CM | POA: Diagnosis not present

## 2023-09-27 DIAGNOSIS — N186 End stage renal disease: Secondary | ICD-10-CM | POA: Diagnosis not present

## 2023-09-27 DIAGNOSIS — E113513 Type 2 diabetes mellitus with proliferative diabetic retinopathy with macular edema, bilateral: Secondary | ICD-10-CM | POA: Diagnosis not present

## 2023-09-27 DIAGNOSIS — D509 Iron deficiency anemia, unspecified: Secondary | ICD-10-CM | POA: Diagnosis not present

## 2023-09-27 DIAGNOSIS — H31013 Macula scars of posterior pole (postinflammatory) (post-traumatic), bilateral: Secondary | ICD-10-CM | POA: Diagnosis not present

## 2023-09-27 DIAGNOSIS — N250 Renal osteodystrophy: Secondary | ICD-10-CM | POA: Diagnosis not present

## 2023-09-27 DIAGNOSIS — Z9889 Other specified postprocedural states: Secondary | ICD-10-CM | POA: Diagnosis not present

## 2023-09-28 ENCOUNTER — Encounter: Payer: Self-pay | Admitting: Student in an Organized Health Care Education/Training Program

## 2023-09-28 ENCOUNTER — Ambulatory Visit: Admitting: Student in an Organized Health Care Education/Training Program

## 2023-09-28 ENCOUNTER — Ambulatory Visit
Admission: RE | Admit: 2023-09-28 | Discharge: 2023-09-28 | Disposition: A | Discharge status: Home / Self Care | Source: Ambulatory Visit | Attending: Student in an Organized Health Care Education/Training Program | Admitting: Student in an Organized Health Care Education/Training Program

## 2023-09-28 VITALS — BP 120/62 | HR 77 | Temp 97.5°F | Resp 16 | Ht 65.0 in | Wt 200.0 lb

## 2023-09-28 DIAGNOSIS — M1711 Unilateral primary osteoarthritis, right knee: Secondary | ICD-10-CM | POA: Diagnosis not present

## 2023-09-28 DIAGNOSIS — G894 Chronic pain syndrome: Secondary | ICD-10-CM | POA: Diagnosis not present

## 2023-09-28 DIAGNOSIS — M170 Bilateral primary osteoarthritis of knee: Secondary | ICD-10-CM | POA: Diagnosis not present

## 2023-09-28 DIAGNOSIS — G8929 Other chronic pain: Secondary | ICD-10-CM

## 2023-09-28 DIAGNOSIS — M1712 Unilateral primary osteoarthritis, left knee: Secondary | ICD-10-CM | POA: Insufficient documentation

## 2023-09-28 DIAGNOSIS — M25562 Pain in left knee: Secondary | ICD-10-CM | POA: Diagnosis not present

## 2023-09-28 MED ORDER — ROPIVACAINE HCL 2 MG/ML IJ SOLN
9.0000 mL | Freq: Once | INTRAMUSCULAR | Status: AC
  Administered 2023-09-28: 9 mL via PERINEURAL

## 2023-09-28 MED ORDER — LIDOCAINE HCL 2 % IJ SOLN
20.0000 mL | Freq: Once | INTRAMUSCULAR | Status: AC
  Administered 2023-09-28: 400 mg

## 2023-09-28 MED ORDER — DEXAMETHASONE SODIUM PHOSPHATE 10 MG/ML IJ SOLN: 10.0000 mg | Freq: Once | INTRAMUSCULAR | Status: DC

## 2023-09-28 MED ORDER — ROPIVACAINE HCL 2 MG/ML IJ SOLN
INTRAMUSCULAR | Status: AC
  Filled 2023-09-28: qty 20

## 2023-09-28 MED ORDER — DIAZEPAM 5 MG PO TABS
ORAL_TABLET | ORAL | Status: AC
  Filled 2023-09-28: qty 1

## 2023-09-28 MED ORDER — DIAZEPAM 5 MG PO TABS
5.0000 mg | ORAL_TABLET | ORAL | Status: AC
  Administered 2023-09-28: 5 mg via ORAL

## 2023-09-28 MED ORDER — DEXAMETHASONE SODIUM PHOSPHATE 10 MG/ML IJ SOLN
INTRAMUSCULAR | Status: AC
  Filled 2023-09-28: qty 2

## 2023-09-28 MED ORDER — DEXAMETHASONE SODIUM PHOSPHATE 10 MG/ML IJ SOLN
10.0000 mg | Freq: Once | INTRAMUSCULAR | Status: AC
  Administered 2023-09-28: 10 mg

## 2023-09-28 MED ORDER — LIDOCAINE HCL 2 % IJ SOLN
INTRAMUSCULAR | Status: AC
  Filled 2023-09-28: qty 20

## 2023-09-28 NOTE — Patient Instructions (Signed)

## 2023-09-28 NOTE — Progress Notes (Signed)
 PROVIDER NOTE: Interpretation of information contained herein should be left to medically-trained personnel. Specific patient instructions are provided elsewhere under Patient Instructions section of medical record. This document was created in part using STT-dictation technology, any transcriptional errors that may result from this process are unintentional.  Patient: Ronal DELENA Harvest Type: Established DOB: December 08, 1955 MRN: [id number] PCP: Donzella Lauraine SAILOR, DO  Service: Procedure DOS: 09/28/2023 Setting: Ambulatory Location: Ambulatory outpatient facility Delivery: Face-to-face Provider: Wallie Sherry, MD Specialty: Interventional Pain Management Specialty designation: 09 Location: Outpatient facility Ref. Prov.: Pardue, Lauraine SAILOR, DO       Interventional Therapy   Procedure:               Type: Genicular Nerves Block (Superolateral, Superomedial, and Inferomedial Genicular Nerves)   Laterality: Bilateral (-50)  Level: Superior and inferior to the knee joint.  Imaging: Fluoroscopic guidance Anesthesia: Local anesthesia (1-2% Lidocaine ) Sedation: PO Valium  DOS: 09/28/2023  Performed by: Wallie Sherry, MD  Purpose: Diagnostic/Therapeutic Indications: Chronic knee pain severe enough to impact quality of life or function. Rationale (medical necessity): procedure needed and proper for the diagnosis and/or treatment of Ms. Kaufmann's medical symptoms and needs. 1. Primary osteoarthritis of left knee   2. Chronic pain of left knee   3. Primary osteoarthritis of right knee   4. Chronic pain syndrome    NAS-11 Pain score:   Pre-procedure: 7  (left,  3 right)/10   Post-procedure: 7  (left,  3 right)/10     Target: For Genicular Nerve block(s), the targets are: the superolateral genicular nerve, located in the lateral distal portion of the femoral shaft as it curves to form the lateral epicondyle, in the region of the distal femoral metaphysis; the superomedial genicular nerve, located in the  medial distal portion of the femoral shaft as it curves to form the medial epicondyle; and the inferomedial genicular nerve, located in the medial, proximal portion of the tibial shaft, as it curves to form the medial epicondyle, in the region of the proximal tibial metaphysis.  Location: Superolateral, Superomedial, and Inferomedial aspects of knee joint.  Region: Lateral, Anterior, and Medial aspects of the knee joint, above and below the knee joint proper. Approach: Percutaneous  Type of procedure: Percutaneous perineural nerve block. The genicular nerve block is a motor-sparing technique that anesthetizes the sensory terminal branches innervating the knee joint, resulting in anesthesia of the anterior compartment of the knee. The distribution of anesthesia of each nerve is mostly in the corresponding quadrant.  Neuroanatomy: The superolateral genicular nerve (SLGN) courses around the femur shaft to pass between the vastus lateralis and the lateral epicondyle. It accompanies the superior lateral genicular artery. The superomedial genicular nerve (SMGN) courses around the femur shaft, following the superior medial genicular artery, to pass between the adductor magnus tendon and the medial epicondyle below the vastus medialis. The inferolateral genicular nerve (ILGN) courses around the tibial lateral epicondyle deep to the lateral collateral ligament, following the inferior lateral genicular artery, superior of the fibula head. The inferomedial genicular nerve (IMGN) courses horizontally below the medial collateral ligament between the tibial medial epicondyle and the insertion of the collateral ligament. It accompanies the inferior medial genicular artery. The recurrent peroneal nerve originates in the inferior popliteal region from the common peroneal nerve and courses horizontally around the fibula to pass just inferior of the fibula head and travel superior to the anterolateral tibial epicondyle. It  accompanies the recurrent tibial artery.  Position / Prep / Materials:  Position: Supine, Modified Fowler's  position with pillows under the targeted knee(s). The patient is placed in a supine position with the knee slightly flexed by placing a pillow in the popliteal fossa. Prep solution: ChloraPrep (2% chlorhexidine  gluconate and 70% isopropyl alcohol) Prep Area: Entire knee area, from mid-thigh to mid-shin, lateral, anterior, and medial aspects. Materials:  Tray: Block Needle(s):  Type: Spinal  Gauge (G): 22  Length: 3.5-in  Qty: 3  H&P (Pre-op  Assessment):  Ms. Gieger is a 68 y.o. (year old), female patient, seen today for interventional treatment. She  has a past surgical history that includes Abdominal hysterectomy; Tubal ligation (02/09/1976); Cesarean section (02/09/1976); Knee arthroscopy (Left, 02/09/2003); Ulnar nerve transposition (02/08/2006); Bilateral salpingoophorectomy (02/08/1998); Ankle fracture surgery (Left, 02/09/2000); TEE without cardioversion (N/A, 01/31/2015); Cardiac catheterization (N/A, 03/25/2015); Cardiac catheterization (N/A, 08/05/2015); Esophagogastroduodenoscopy (egd) with propofol  (N/A, 09/22/2016); Colonoscopy with propofol  (N/A, 09/22/2016); Esophagogastroduodenoscopy (egd) with propofol  (N/A, 12/08/2016); Lower Extremity Angiography (Left, 08/12/2021); TEMPORARY DIALYSIS CATHETER (N/A, 08/14/2021); Fracture surgery; Colonoscopy with propofol  (N/A, 02/15/2022); Esophagogastroduodenoscopy (N/A, 02/15/2022); Givens capsule study (N/A, 04/19/2022); AV fistula placement (Left, 06/18/2022); Insertion hybrid anteriovenous gortex graft (06/18/2022); Esophagogastroduodenoscopy (egd) with propofol  (N/A, 10/12/2022); Givens capsule study (N/A, 10/12/2022); Facial laceration repair (N/A, 12/27/2022); PERIPHERAL VASCULAR THROMBECTOMY (Left, 01/19/2023); and DIALYSIS/PERMA CATHETER INSERTION (N/A, 01/19/2023). Ms. Tavis has a current medication list which includes the following  prescription(s): albuterol , atorvastatin , bupropion , calcium  carbonate, eliquis , fluticasone , gabapentin , hydroxyurea , insulin  aspart, omnipod dash pods (gen 4), insulin  pump, loratadine , losartan , meclizine , oxygen -helium, paroxetine , optichamber diamond , torsemide , tramadol , and vitamin d  (ergocalciferol ), and the following Facility-Administered Medications: dexamethasone . Her primarily concern today is the Knee Pain (bilateral)  Initial Vital Signs:  Pulse/HCG Rate: 77ECG Heart Rate: 74 Temp: (!) 97.5 F (36.4 C) Resp: 18 BP: 117/69 SpO2: 98 %  BMI: Estimated body mass index is 33.28 kg/m as calculated from the following:   Height as of this encounter: 5' 5 (1.651 m).   Weight as of this encounter: 200 lb (90.7 kg).  Risk Assessment: Allergies: Reviewed. She is allergic to ivp dye [iodinated contrast media] and codeine.  Allergy Precautions: None required Coagulopathies: Reviewed. None identified.  Blood-thinner therapy: None at this time Active Infection(s): Reviewed. None identified. Ms. Lucchetti is afebrile  Site Confirmation: Ms. Pearlman was asked to confirm the procedure and laterality before marking the site Procedure checklist: Completed Consent: Before the procedure and under the influence of no sedative(s), amnesic(s), or anxiolytics, the patient was informed of the treatment options, risks and possible complications. To fulfill our ethical and legal obligations, as recommended by the American Medical Association's Code of Ethics, I have informed the patient of my clinical impression; the nature and purpose of the treatment or procedure; the risks, benefits, and possible complications of the intervention; the alternatives, including doing nothing; the risk(s) and benefit(s) of the alternative treatment(s) or procedure(s); and the risk(s) and benefit(s) of doing nothing. The patient was provided information about the general risks and possible complications associated with the  procedure. These may include, but are not limited to: failure to achieve desired goals, infection, bleeding, organ or nerve damage, allergic reactions, paralysis, and death. In addition, the patient was informed of those risks and complications associated to the procedure, such as failure to decrease pain; infection; bleeding; organ or nerve damage with subsequent damage to sensory, motor, and/or autonomic systems, resulting in permanent pain, numbness, and/or weakness of one or several areas of the body; allergic reactions; (i.e.: anaphylactic reaction); and/or death. Furthermore, the patient was informed of those risks and complications associated with  the medications. These include, but are not limited to: allergic reactions (i.e.: anaphylactic or anaphylactoid reaction(s)); adrenal axis suppression; blood sugar elevation that in diabetics may result in ketoacidosis or comma; water retention that in patients with history of congestive heart failure may result in shortness of breath, pulmonary edema, and decompensation with resultant heart failure; weight gain; swelling or edema; medication-induced neural toxicity; particulate matter embolism and blood vessel occlusion with resultant organ, and/or nervous system infarction; and/or aseptic necrosis of one or more joints. Finally, the patient was informed that Medicine is not an exact science; therefore, there is also the possibility of unforeseen or unpredictable risks and/or possible complications that may result in a catastrophic outcome. The patient indicated having understood very clearly. We have given the patient no guarantees and we have made no promises. Enough time was given to the patient to ask questions, all of which were answered to the patient's satisfaction. Ms. Duck has indicated that she wanted to continue with the procedure. Attestation: I, the ordering provider, attest that I have discussed with the patient the benefits, risks,  side-effects, alternatives, likelihood of achieving goals, and potential problems during recovery for the procedure that I have provided informed consent. Date  Time: 09/28/2023 10:50 AM  Pre-Procedure Preparation:  Monitoring: As per clinic protocol. Respiration, ETCO2, SpO2, BP, heart rate and rhythm monitor placed and checked for adequate function Safety Precautions: Patient was assessed for positional comfort and pressure points before starting the procedure. Time-out: I initiated and conducted the Time-out before starting the procedure, as per protocol. The patient was asked to participate by confirming the accuracy of the Time Out information. Verification of the correct person, site, and procedure were performed and confirmed by me, the nursing staff, and the patient. Time-out conducted as per Joint Commission's Universal Protocol (UP.01.01.01). Time: 1124 Start Time: 1124 hrs.  Description/Narrative of Procedure:          Rationale (medical necessity): procedure needed and proper for the diagnosis and/or treatment of the patient's medical symptoms and needs. Procedural Technique Safety Precautions: Aspiration looking for blood return was conducted prior to all injections. At no point did we inject any substances, as a needle was being advanced. No attempts were made at seeking any paresthesias. Safe injection practices and needle disposal techniques used. Medications properly checked for expiration dates. SDV (single dose vial) medications used. Description of the Procedure: Protocol guidelines were followed. The patient was assisted into a comfortable position. The target area was identified and the area prepped in the usual manner. Skin & deeper tissues infiltrated with local anesthetic. Appropriate amount of time allowed to pass for local anesthetics to take effect. The procedure needles were then advanced to the target area. Proper needle placement secured. Negative aspiration  confirmed. Solution injected in intermittent fashion, asking for systemic symptoms every 0.5cc of injectate. The needles were then removed and the area cleansed, making sure to leave some of the prepping solution back to take advantage of its long term bactericidal properties.  3.3 cc of nerve block solution injected for each of the genicular nerves. (10 cc solution made of 9 cc of 0.2% ropivacaine , 1 cc of Decadron  10 mg/cc) 3.3 cc of nerve block solution injected for each of the genicular nerves. (10 cc solution made of 9 cc of 0.2% ropivacaine , 1 cc of Decadron  10 mg/cc)             Vitals:   09/28/23 1054 09/28/23 1114 09/28/23 1124 09/28/23 1130  BP: 117/69 138/67  118/62 120/62  Pulse: 77     Resp: 18 16 15 16   Temp: (!) 97.5 F (36.4 C)     TempSrc: Temporal     SpO2: 98% 99% 99% 100%  Weight: 200 lb (90.7 kg)     Height: 5' 5 (1.651 m)        Start Time: 1124 hrs. End Time: 1129 hrs.  Imaging Guidance (Non-Spinal):          Type of Imaging Technique: Fluoroscopy Guidance (Non-Spinal) Indication(s): Fluoroscopy guidance for needle placement to enhance accuracy in procedures requiring precise needle localization for targeted delivery of medication in or near specific anatomical locations not easily accessible without such real-time imaging assistance. Exposure Time: Please see nurses notes. Contrast: None used. Fluoroscopic Guidance: I was personally present during the use of fluoroscopy. Tunnel Vision Technique used to obtain the best possible view of the target area. Parallax error corrected before commencing the procedure. Direction-depth-direction technique used to introduce the needle under continuous pulsed fluoroscopy. Once target was reached, antero-posterior, oblique, and lateral fluoroscopic projection used confirm needle placement in all planes. Images permanently stored in EMR. Interpretation: No contrast injected. I personally interpreted the imaging  intraoperatively. Adequate needle placement confirmed in multiple planes. Permanent images saved into the patient's record.  Post-operative Assessment:  Post-procedure Vital Signs:  Pulse/HCG Rate: 7776 Temp: (!) 97.5 F (36.4 C) Resp: 16 BP: 120/62 SpO2: 100 %  EBL: None  Complications: No immediate post-treatment complications observed by team, or reported by patient.  Note: The patient tolerated the entire procedure well. A repeat set of vitals were taken after the procedure and the patient was kept under observation following institutional policy, for this type of procedure. Post-procedural neurological assessment was performed, showing return to baseline, prior to discharge. The patient was provided with post-procedure discharge instructions, including a section on how to identify potential problems. Should any problems arise concerning this procedure, the patient was given instructions to immediately contact us , at any time, without hesitation. In any case, we plan to contact the patient by telephone for a follow-up status report regarding this interventional procedure.  Comments:  No additional relevant information.  Plan of Care (POC)  Orders:  Orders Placed This Encounter  Procedures   DG PAIN [facility] C-ARM 1-60 MIN NO REPORT    Intraoperative interpretation by procedural physician at [facility].    Standing Status:   Standing    Number of Occurrences:   1    Reason for exam::   Assistance in needle guidance and placement for procedures requiring needle placement in or near specific anatomical locations not easily accessible without such assistance.     Medications ordered for procedure: Meds ordered this encounter  Medications   lidocaine  (XYLOCAINE ) 2 % (with pres) injection 400 mg   diazepam  (VALIUM ) tablet 5 mg    Make sure Flumazenil is available in the pyxis when using this medication. If oversedation occurs, administer 0.2 mg IV over 15 sec. If after 45  sec no response, administer 0.2 mg again over 1 min; may repeat at 1 min intervals; not to exceed 4 doses (1 mg)   ropivacaine  (PF) 2 mg/mL (0.2%) (NAROPIN ) injection 9 mL   dexamethasone  (DECADRON ) injection 10 mg   dexamethasone  (DECADRON ) injection 10 mg   Medications administered: We administered lidocaine , diazepam , ropivacaine  (PF) 2 mg/mL (0.2%), and dexamethasone .  See the medical record for exact dosing, route, and time of administration.  Follow-up plan:   Return for Keep sch.  appt.       Left genicular nerve block 03/09/23, Bilateral GNB 09/28/2023    Recent Visits Date Type Provider Dept  08/15/23 Office Visit Patel, Seema K, NP [facility]  Showing recent visits within past 90 days and meeting all other requirements Today's Visits Date Type Provider Dept  09/28/23 Procedure visit Marcelino Nurse, MD [facility]  Showing today's visits and meeting all other requirements Future Appointments Date Type Provider Dept  11/14/23 Appointment Patel, Seema K, NP [facility]  Showing future appointments within next 90 days and meeting all other requirements  Disposition: Discharge home  Discharge (Date  Time): 09/28/2023;   hrs.   Primary Care Physician: Donzella Lauraine SAILOR, DO Location: [facility] Note by: Nurse Marcelino, MD (TTS technology used. I apologize for any typographical errors that were not detected and corrected.) Date: 09/28/2023; Time: 11:35 AM  Disclaimer:  Medicine is not an Visual merchandiser. The only guarantee in medicine is that nothing is guaranteed. It is important to note that the decision to proceed with this intervention was based on the information collected from the patient. The Data and conclusions were drawn from the patient's questionnaire, the interview, and the physical examination. Because the information was provided in large part by the patient, it cannot be guaranteed that it has not been purposely  or unconsciously manipulated. Every effort has been made to obtain as much relevant data as possible for this evaluation. It is important to note that the conclusions that lead to this procedure are derived in large part from the available data. Always take into account that the treatment will also be dependent on availability of resources and existing treatment guidelines, considered by other Pain Management Practitioners as being common knowledge and practice, at the time of the intervention. For Medico-Legal purposes, it is also important to point out that variation in procedural techniques and pharmacological choices are the acceptable norm. The indications, contraindications, technique, and results of the above procedure should only be interpreted and judged by a Board-Certified Interventional Pain Specialist with extensive familiarity and expertise in the same exact procedure and technique.

## 2023-09-29 ENCOUNTER — Telehealth: Payer: Self-pay

## 2023-09-29 DIAGNOSIS — T82868A Thrombosis of vascular prosthetic devices, implants and grafts, initial encounter: Secondary | ICD-10-CM | POA: Diagnosis not present

## 2023-09-29 DIAGNOSIS — I132 Hypertensive heart and chronic kidney disease with heart failure and with stage 5 chronic kidney disease, or end stage renal disease: Secondary | ICD-10-CM | POA: Diagnosis not present

## 2023-09-29 DIAGNOSIS — Z720 Tobacco use: Secondary | ICD-10-CM | POA: Diagnosis not present

## 2023-09-29 DIAGNOSIS — D631 Anemia in chronic kidney disease: Secondary | ICD-10-CM | POA: Diagnosis not present

## 2023-09-29 DIAGNOSIS — T82858A Stenosis of vascular prosthetic devices, implants and grafts, initial encounter: Secondary | ICD-10-CM | POA: Diagnosis not present

## 2023-09-29 DIAGNOSIS — E1122 Type 2 diabetes mellitus with diabetic chronic kidney disease: Secondary | ICD-10-CM | POA: Diagnosis not present

## 2023-09-29 DIAGNOSIS — I509 Heart failure, unspecified: Secondary | ICD-10-CM | POA: Diagnosis not present

## 2023-09-29 DIAGNOSIS — I871 Compression of vein: Secondary | ICD-10-CM | POA: Diagnosis not present

## 2023-09-29 DIAGNOSIS — N186 End stage renal disease: Secondary | ICD-10-CM | POA: Diagnosis not present

## 2023-09-29 DIAGNOSIS — Z992 Dependence on renal dialysis: Secondary | ICD-10-CM | POA: Diagnosis not present

## 2023-09-29 DIAGNOSIS — M1030 Gout due to renal impairment, unspecified site: Secondary | ICD-10-CM | POA: Diagnosis not present

## 2023-09-29 DIAGNOSIS — J449 Chronic obstructive pulmonary disease, unspecified: Secondary | ICD-10-CM | POA: Diagnosis not present

## 2023-09-29 NOTE — Telephone Encounter (Signed)
 Post procedure follow up.  LM

## 2023-10-01 DIAGNOSIS — Z992 Dependence on renal dialysis: Secondary | ICD-10-CM | POA: Diagnosis not present

## 2023-10-01 DIAGNOSIS — D631 Anemia in chronic kidney disease: Secondary | ICD-10-CM | POA: Diagnosis not present

## 2023-10-01 DIAGNOSIS — N186 End stage renal disease: Secondary | ICD-10-CM | POA: Diagnosis not present

## 2023-10-01 DIAGNOSIS — N2581 Secondary hyperparathyroidism of renal origin: Secondary | ICD-10-CM | POA: Diagnosis not present

## 2023-10-01 DIAGNOSIS — D509 Iron deficiency anemia, unspecified: Secondary | ICD-10-CM | POA: Diagnosis not present

## 2023-10-01 DIAGNOSIS — N250 Renal osteodystrophy: Secondary | ICD-10-CM | POA: Diagnosis not present

## 2023-10-04 DIAGNOSIS — N250 Renal osteodystrophy: Secondary | ICD-10-CM | POA: Diagnosis not present

## 2023-10-04 DIAGNOSIS — N2581 Secondary hyperparathyroidism of renal origin: Secondary | ICD-10-CM | POA: Diagnosis not present

## 2023-10-04 DIAGNOSIS — N186 End stage renal disease: Secondary | ICD-10-CM | POA: Diagnosis not present

## 2023-10-04 DIAGNOSIS — D509 Iron deficiency anemia, unspecified: Secondary | ICD-10-CM | POA: Diagnosis not present

## 2023-10-04 DIAGNOSIS — D631 Anemia in chronic kidney disease: Secondary | ICD-10-CM | POA: Diagnosis not present

## 2023-10-04 DIAGNOSIS — Z992 Dependence on renal dialysis: Secondary | ICD-10-CM | POA: Diagnosis not present

## 2023-10-06 DIAGNOSIS — Z992 Dependence on renal dialysis: Secondary | ICD-10-CM | POA: Diagnosis not present

## 2023-10-06 DIAGNOSIS — N250 Renal osteodystrophy: Secondary | ICD-10-CM | POA: Diagnosis not present

## 2023-10-06 DIAGNOSIS — N186 End stage renal disease: Secondary | ICD-10-CM | POA: Diagnosis not present

## 2023-10-06 DIAGNOSIS — D509 Iron deficiency anemia, unspecified: Secondary | ICD-10-CM | POA: Diagnosis not present

## 2023-10-06 DIAGNOSIS — D631 Anemia in chronic kidney disease: Secondary | ICD-10-CM | POA: Diagnosis not present

## 2023-10-06 DIAGNOSIS — N2581 Secondary hyperparathyroidism of renal origin: Secondary | ICD-10-CM | POA: Diagnosis not present

## 2023-10-08 DIAGNOSIS — Z992 Dependence on renal dialysis: Secondary | ICD-10-CM | POA: Diagnosis not present

## 2023-10-08 DIAGNOSIS — N250 Renal osteodystrophy: Secondary | ICD-10-CM | POA: Diagnosis not present

## 2023-10-08 DIAGNOSIS — D509 Iron deficiency anemia, unspecified: Secondary | ICD-10-CM | POA: Diagnosis not present

## 2023-10-08 DIAGNOSIS — N2581 Secondary hyperparathyroidism of renal origin: Secondary | ICD-10-CM | POA: Diagnosis not present

## 2023-10-08 DIAGNOSIS — N186 End stage renal disease: Secondary | ICD-10-CM | POA: Diagnosis not present

## 2023-10-08 DIAGNOSIS — D631 Anemia in chronic kidney disease: Secondary | ICD-10-CM | POA: Diagnosis not present

## 2023-10-09 DIAGNOSIS — N186 End stage renal disease: Secondary | ICD-10-CM | POA: Diagnosis not present

## 2023-10-09 DIAGNOSIS — Z992 Dependence on renal dialysis: Secondary | ICD-10-CM | POA: Diagnosis not present

## 2023-10-10 ENCOUNTER — Other Ambulatory Visit: Payer: Self-pay | Admitting: Oncology

## 2023-10-11 ENCOUNTER — Encounter: Payer: Self-pay | Admitting: Oncology

## 2023-10-11 ENCOUNTER — Telehealth: Payer: Self-pay

## 2023-10-11 DIAGNOSIS — D631 Anemia in chronic kidney disease: Secondary | ICD-10-CM | POA: Diagnosis not present

## 2023-10-11 DIAGNOSIS — Z992 Dependence on renal dialysis: Secondary | ICD-10-CM | POA: Diagnosis not present

## 2023-10-11 DIAGNOSIS — E559 Vitamin D deficiency, unspecified: Secondary | ICD-10-CM | POA: Diagnosis not present

## 2023-10-11 DIAGNOSIS — N2581 Secondary hyperparathyroidism of renal origin: Secondary | ICD-10-CM | POA: Diagnosis not present

## 2023-10-11 DIAGNOSIS — N186 End stage renal disease: Secondary | ICD-10-CM | POA: Diagnosis not present

## 2023-10-11 DIAGNOSIS — D509 Iron deficiency anemia, unspecified: Secondary | ICD-10-CM | POA: Diagnosis not present

## 2023-10-11 NOTE — Telephone Encounter (Unsigned)
 Copied from CRM [id number]. Topic: Clinical - Order For Equipment >> Oct 11, 2023  2:55 PM Wess S wrote: Reason for CRM: Patient states her walker is broken. Her insurance stated she will need a prescription written for a new walker for long term use. She would like a call when the prescription is ready for pickup  Callback #: [phone] or [phone]

## 2023-10-12 ENCOUNTER — Telehealth: Payer: Self-pay | Admitting: Family Medicine

## 2023-10-12 NOTE — Telephone Encounter (Unsigned)
 Copied from CRM [id number]. Topic: Clinical - Order For Equipment >> Oct 12, 2023  1:25 PM Tiffini S wrote: Reason for CRM: Patient called to request a walker/ please call the patient at [phone] for an update. Patient is asking for equipment to be mailed to her address.

## 2023-10-13 DIAGNOSIS — D631 Anemia in chronic kidney disease: Secondary | ICD-10-CM | POA: Diagnosis not present

## 2023-10-13 DIAGNOSIS — N186 End stage renal disease: Secondary | ICD-10-CM | POA: Diagnosis not present

## 2023-10-13 DIAGNOSIS — E559 Vitamin D deficiency, unspecified: Secondary | ICD-10-CM | POA: Diagnosis not present

## 2023-10-13 DIAGNOSIS — N2581 Secondary hyperparathyroidism of renal origin: Secondary | ICD-10-CM | POA: Diagnosis not present

## 2023-10-13 DIAGNOSIS — Z992 Dependence on renal dialysis: Secondary | ICD-10-CM | POA: Diagnosis not present

## 2023-10-13 DIAGNOSIS — D509 Iron deficiency anemia, unspecified: Secondary | ICD-10-CM | POA: Diagnosis not present

## 2023-10-13 NOTE — Telephone Encounter (Signed)
 Per Dr. Donzella rx written for rollator, called patient to advise that rx was left up front for her to pick up.

## 2023-10-14 DIAGNOSIS — E1165 Type 2 diabetes mellitus with hyperglycemia: Secondary | ICD-10-CM | POA: Diagnosis not present

## 2023-10-14 DIAGNOSIS — E1122 Type 2 diabetes mellitus with diabetic chronic kidney disease: Secondary | ICD-10-CM | POA: Diagnosis not present

## 2023-10-14 DIAGNOSIS — R809 Proteinuria, unspecified: Secondary | ICD-10-CM | POA: Diagnosis not present

## 2023-10-14 DIAGNOSIS — Z794 Long term (current) use of insulin: Secondary | ICD-10-CM | POA: Diagnosis not present

## 2023-10-14 DIAGNOSIS — E1159 Type 2 diabetes mellitus with other circulatory complications: Secondary | ICD-10-CM | POA: Diagnosis not present

## 2023-10-14 DIAGNOSIS — N186 End stage renal disease: Secondary | ICD-10-CM | POA: Diagnosis not present

## 2023-10-14 DIAGNOSIS — Z9641 Presence of insulin pump (external) (internal): Secondary | ICD-10-CM | POA: Diagnosis not present

## 2023-10-14 DIAGNOSIS — I10 Essential (primary) hypertension: Secondary | ICD-10-CM | POA: Diagnosis not present

## 2023-10-14 DIAGNOSIS — E1142 Type 2 diabetes mellitus with diabetic polyneuropathy: Secondary | ICD-10-CM | POA: Diagnosis not present

## 2023-10-14 DIAGNOSIS — E1129 Type 2 diabetes mellitus with other diabetic kidney complication: Secondary | ICD-10-CM | POA: Diagnosis not present

## 2023-10-14 DIAGNOSIS — E1121 Type 2 diabetes mellitus with diabetic nephropathy: Secondary | ICD-10-CM | POA: Diagnosis not present

## 2023-10-14 DIAGNOSIS — E113393 Type 2 diabetes mellitus with moderate nonproliferative diabetic retinopathy without macular edema, bilateral: Secondary | ICD-10-CM | POA: Diagnosis not present

## 2023-10-15 ENCOUNTER — Other Ambulatory Visit: Payer: Self-pay | Admitting: Family

## 2023-10-15 DIAGNOSIS — N186 End stage renal disease: Secondary | ICD-10-CM | POA: Diagnosis not present

## 2023-10-15 DIAGNOSIS — D631 Anemia in chronic kidney disease: Secondary | ICD-10-CM | POA: Diagnosis not present

## 2023-10-15 DIAGNOSIS — N2581 Secondary hyperparathyroidism of renal origin: Secondary | ICD-10-CM | POA: Diagnosis not present

## 2023-10-15 DIAGNOSIS — D509 Iron deficiency anemia, unspecified: Secondary | ICD-10-CM | POA: Diagnosis not present

## 2023-10-15 DIAGNOSIS — E559 Vitamin D deficiency, unspecified: Secondary | ICD-10-CM | POA: Diagnosis not present

## 2023-10-15 DIAGNOSIS — Z992 Dependence on renal dialysis: Secondary | ICD-10-CM | POA: Diagnosis not present

## 2023-10-18 NOTE — Telephone Encounter (Signed)
 Patient no longer following up with [facility].

## 2023-10-20 DIAGNOSIS — E559 Vitamin D deficiency, unspecified: Secondary | ICD-10-CM | POA: Diagnosis not present

## 2023-10-20 DIAGNOSIS — D509 Iron deficiency anemia, unspecified: Secondary | ICD-10-CM | POA: Diagnosis not present

## 2023-10-20 DIAGNOSIS — N2581 Secondary hyperparathyroidism of renal origin: Secondary | ICD-10-CM | POA: Diagnosis not present

## 2023-10-20 DIAGNOSIS — D631 Anemia in chronic kidney disease: Secondary | ICD-10-CM | POA: Diagnosis not present

## 2023-10-20 DIAGNOSIS — H2512 Age-related nuclear cataract, left eye: Secondary | ICD-10-CM | POA: Diagnosis not present

## 2023-10-20 DIAGNOSIS — N186 End stage renal disease: Secondary | ICD-10-CM | POA: Diagnosis not present

## 2023-10-20 DIAGNOSIS — Z992 Dependence on renal dialysis: Secondary | ICD-10-CM | POA: Diagnosis not present

## 2023-10-22 DIAGNOSIS — D631 Anemia in chronic kidney disease: Secondary | ICD-10-CM | POA: Diagnosis not present

## 2023-10-22 DIAGNOSIS — N2581 Secondary hyperparathyroidism of renal origin: Secondary | ICD-10-CM | POA: Diagnosis not present

## 2023-10-22 DIAGNOSIS — Z992 Dependence on renal dialysis: Secondary | ICD-10-CM | POA: Diagnosis not present

## 2023-10-22 DIAGNOSIS — D509 Iron deficiency anemia, unspecified: Secondary | ICD-10-CM | POA: Diagnosis not present

## 2023-10-22 DIAGNOSIS — E559 Vitamin D deficiency, unspecified: Secondary | ICD-10-CM | POA: Diagnosis not present

## 2023-10-22 DIAGNOSIS — N186 End stage renal disease: Secondary | ICD-10-CM | POA: Diagnosis not present

## 2023-10-25 DIAGNOSIS — E559 Vitamin D deficiency, unspecified: Secondary | ICD-10-CM | POA: Diagnosis not present

## 2023-10-25 DIAGNOSIS — N186 End stage renal disease: Secondary | ICD-10-CM | POA: Diagnosis not present

## 2023-10-25 DIAGNOSIS — D631 Anemia in chronic kidney disease: Secondary | ICD-10-CM | POA: Diagnosis not present

## 2023-10-25 DIAGNOSIS — D509 Iron deficiency anemia, unspecified: Secondary | ICD-10-CM | POA: Diagnosis not present

## 2023-10-25 DIAGNOSIS — Z992 Dependence on renal dialysis: Secondary | ICD-10-CM | POA: Diagnosis not present

## 2023-10-25 DIAGNOSIS — N2581 Secondary hyperparathyroidism of renal origin: Secondary | ICD-10-CM | POA: Diagnosis not present

## 2023-10-27 DIAGNOSIS — E559 Vitamin D deficiency, unspecified: Secondary | ICD-10-CM | POA: Diagnosis not present

## 2023-10-27 DIAGNOSIS — D509 Iron deficiency anemia, unspecified: Secondary | ICD-10-CM | POA: Diagnosis not present

## 2023-10-27 DIAGNOSIS — N2581 Secondary hyperparathyroidism of renal origin: Secondary | ICD-10-CM | POA: Diagnosis not present

## 2023-10-27 DIAGNOSIS — D631 Anemia in chronic kidney disease: Secondary | ICD-10-CM | POA: Diagnosis not present

## 2023-10-27 DIAGNOSIS — Z992 Dependence on renal dialysis: Secondary | ICD-10-CM | POA: Diagnosis not present

## 2023-10-27 DIAGNOSIS — N186 End stage renal disease: Secondary | ICD-10-CM | POA: Diagnosis not present

## 2023-11-01 ENCOUNTER — Telehealth: Payer: Self-pay

## 2023-11-01 DIAGNOSIS — N2581 Secondary hyperparathyroidism of renal origin: Secondary | ICD-10-CM | POA: Diagnosis not present

## 2023-11-01 DIAGNOSIS — Z992 Dependence on renal dialysis: Secondary | ICD-10-CM | POA: Diagnosis not present

## 2023-11-01 DIAGNOSIS — D509 Iron deficiency anemia, unspecified: Secondary | ICD-10-CM | POA: Diagnosis not present

## 2023-11-01 DIAGNOSIS — D631 Anemia in chronic kidney disease: Secondary | ICD-10-CM | POA: Diagnosis not present

## 2023-11-01 DIAGNOSIS — N186 End stage renal disease: Secondary | ICD-10-CM | POA: Diagnosis not present

## 2023-11-01 DIAGNOSIS — E559 Vitamin D deficiency, unspecified: Secondary | ICD-10-CM | POA: Diagnosis not present

## 2023-11-01 NOTE — Telephone Encounter (Signed)
 Copied from CRM [id number]. Topic: Clinical - Medical Advice >> Oct 31, 2023 11:30 AM Thersia BROCKS wrote: Reason for CRM: Patient stated she needs a diagnostic mammogram with a ultrasound  Stated they know where to send it to

## 2023-11-02 ENCOUNTER — Telehealth: Payer: Self-pay

## 2023-11-02 DIAGNOSIS — N6320 Unspecified lump in the left breast, unspecified quadrant: Secondary | ICD-10-CM

## 2023-11-02 DIAGNOSIS — N63 Unspecified lump in unspecified breast: Secondary | ICD-10-CM

## 2023-11-02 DIAGNOSIS — N644 Mastodynia: Secondary | ICD-10-CM

## 2023-11-02 NOTE — Telephone Encounter (Signed)
 New message was started by [facility] when patient called back.  That message has been forwarded to PCP

## 2023-11-02 NOTE — Telephone Encounter (Signed)
 This will need to be ordered as diagnostic.  Do you need to see patient and do breast exam first?     Copied from CRM #[id number]. Topic: General - Other >> Nov 02, 2023 11:00 AM Lauren C wrote: Reason for CRM: Pt returning call from Kathi Buel BIRCH, CMA. To answer her questions, patient was last seen at the [facility], but its been years. She cannot provide a good estimate of how many years. She said she called them to schedule an appointment for her mammogram and told them she had lumps in her breasts. Because of this, the scheduler said she needed to have a diagnostic mmg order. They will not do standard mmg if lumps are present. Please order diagnostic, or if appt is needed to assess the lumps, give her a call to schedule with provider.

## 2023-11-03 DIAGNOSIS — Z992 Dependence on renal dialysis: Secondary | ICD-10-CM | POA: Diagnosis not present

## 2023-11-03 DIAGNOSIS — E559 Vitamin D deficiency, unspecified: Secondary | ICD-10-CM | POA: Diagnosis not present

## 2023-11-03 DIAGNOSIS — D509 Iron deficiency anemia, unspecified: Secondary | ICD-10-CM | POA: Diagnosis not present

## 2023-11-03 DIAGNOSIS — D631 Anemia in chronic kidney disease: Secondary | ICD-10-CM | POA: Diagnosis not present

## 2023-11-03 DIAGNOSIS — N186 End stage renal disease: Secondary | ICD-10-CM | POA: Diagnosis not present

## 2023-11-03 DIAGNOSIS — N2581 Secondary hyperparathyroidism of renal origin: Secondary | ICD-10-CM | POA: Diagnosis not present

## 2023-11-04 DIAGNOSIS — H903 Sensorineural hearing loss, bilateral: Secondary | ICD-10-CM | POA: Diagnosis not present

## 2023-11-05 DIAGNOSIS — Z992 Dependence on renal dialysis: Secondary | ICD-10-CM | POA: Diagnosis not present

## 2023-11-05 DIAGNOSIS — D631 Anemia in chronic kidney disease: Secondary | ICD-10-CM | POA: Diagnosis not present

## 2023-11-05 DIAGNOSIS — N2581 Secondary hyperparathyroidism of renal origin: Secondary | ICD-10-CM | POA: Diagnosis not present

## 2023-11-05 DIAGNOSIS — N186 End stage renal disease: Secondary | ICD-10-CM | POA: Diagnosis not present

## 2023-11-05 DIAGNOSIS — E559 Vitamin D deficiency, unspecified: Secondary | ICD-10-CM | POA: Diagnosis not present

## 2023-11-05 DIAGNOSIS — D509 Iron deficiency anemia, unspecified: Secondary | ICD-10-CM | POA: Diagnosis not present

## 2023-11-07 DIAGNOSIS — E113512 Type 2 diabetes mellitus with proliferative diabetic retinopathy with macular edema, left eye: Secondary | ICD-10-CM | POA: Diagnosis not present

## 2023-11-07 DIAGNOSIS — H25012 Cortical age-related cataract, left eye: Secondary | ICD-10-CM | POA: Diagnosis not present

## 2023-11-07 DIAGNOSIS — H3582 Retinal ischemia: Secondary | ICD-10-CM | POA: Diagnosis not present

## 2023-11-07 DIAGNOSIS — H25042 Posterior subcapsular polar age-related cataract, left eye: Secondary | ICD-10-CM | POA: Diagnosis not present

## 2023-11-07 DIAGNOSIS — H2512 Age-related nuclear cataract, left eye: Secondary | ICD-10-CM | POA: Diagnosis not present

## 2023-11-07 DIAGNOSIS — H4312 Vitreous hemorrhage, left eye: Secondary | ICD-10-CM | POA: Diagnosis not present

## 2023-11-08 DIAGNOSIS — Z992 Dependence on renal dialysis: Secondary | ICD-10-CM | POA: Diagnosis not present

## 2023-11-08 DIAGNOSIS — E559 Vitamin D deficiency, unspecified: Secondary | ICD-10-CM | POA: Diagnosis not present

## 2023-11-08 DIAGNOSIS — N186 End stage renal disease: Secondary | ICD-10-CM | POA: Diagnosis not present

## 2023-11-08 DIAGNOSIS — N2581 Secondary hyperparathyroidism of renal origin: Secondary | ICD-10-CM | POA: Diagnosis not present

## 2023-11-08 DIAGNOSIS — D509 Iron deficiency anemia, unspecified: Secondary | ICD-10-CM | POA: Diagnosis not present

## 2023-11-08 DIAGNOSIS — D631 Anemia in chronic kidney disease: Secondary | ICD-10-CM | POA: Diagnosis not present

## 2023-11-10 DIAGNOSIS — N2581 Secondary hyperparathyroidism of renal origin: Secondary | ICD-10-CM | POA: Diagnosis not present

## 2023-11-10 DIAGNOSIS — Z23 Encounter for immunization: Secondary | ICD-10-CM | POA: Diagnosis not present

## 2023-11-10 DIAGNOSIS — N250 Renal osteodystrophy: Secondary | ICD-10-CM | POA: Diagnosis not present

## 2023-11-10 DIAGNOSIS — D631 Anemia in chronic kidney disease: Secondary | ICD-10-CM | POA: Diagnosis not present

## 2023-11-10 DIAGNOSIS — D509 Iron deficiency anemia, unspecified: Secondary | ICD-10-CM | POA: Diagnosis not present

## 2023-11-10 DIAGNOSIS — N186 End stage renal disease: Secondary | ICD-10-CM | POA: Diagnosis not present

## 2023-11-10 DIAGNOSIS — Z992 Dependence on renal dialysis: Secondary | ICD-10-CM | POA: Diagnosis not present

## 2023-11-11 ENCOUNTER — Ambulatory Visit: Attending: Cardiology | Admitting: Cardiology

## 2023-11-11 ENCOUNTER — Encounter: Payer: Self-pay | Admitting: Cardiology

## 2023-11-11 VITALS — BP 138/64 | HR 69 | Ht 66.0 in | Wt 205.0 lb

## 2023-11-11 DIAGNOSIS — I10 Essential (primary) hypertension: Secondary | ICD-10-CM | POA: Insufficient documentation

## 2023-11-11 DIAGNOSIS — Z794 Long term (current) use of insulin: Secondary | ICD-10-CM | POA: Insufficient documentation

## 2023-11-11 DIAGNOSIS — E785 Hyperlipidemia, unspecified: Secondary | ICD-10-CM | POA: Insufficient documentation

## 2023-11-11 DIAGNOSIS — J449 Chronic obstructive pulmonary disease, unspecified: Secondary | ICD-10-CM | POA: Diagnosis not present

## 2023-11-11 DIAGNOSIS — I5020 Unspecified systolic (congestive) heart failure: Secondary | ICD-10-CM | POA: Insufficient documentation

## 2023-11-11 DIAGNOSIS — I428 Other cardiomyopathies: Secondary | ICD-10-CM | POA: Insufficient documentation

## 2023-11-11 DIAGNOSIS — I739 Peripheral vascular disease, unspecified: Secondary | ICD-10-CM | POA: Diagnosis not present

## 2023-11-11 DIAGNOSIS — E1143 Type 2 diabetes mellitus with diabetic autonomic (poly)neuropathy: Secondary | ICD-10-CM | POA: Insufficient documentation

## 2023-11-11 DIAGNOSIS — Z992 Dependence on renal dialysis: Secondary | ICD-10-CM | POA: Insufficient documentation

## 2023-11-11 DIAGNOSIS — N186 End stage renal disease: Secondary | ICD-10-CM | POA: Diagnosis not present

## 2023-11-11 DIAGNOSIS — E1165 Type 2 diabetes mellitus with hyperglycemia: Secondary | ICD-10-CM | POA: Diagnosis not present

## 2023-11-11 DIAGNOSIS — I82511 Chronic embolism and thrombosis of right femoral vein: Secondary | ICD-10-CM | POA: Diagnosis not present

## 2023-11-11 DIAGNOSIS — E1142 Type 2 diabetes mellitus with diabetic polyneuropathy: Secondary | ICD-10-CM | POA: Diagnosis not present

## 2023-11-11 DIAGNOSIS — E1169 Type 2 diabetes mellitus with other specified complication: Secondary | ICD-10-CM | POA: Diagnosis not present

## 2023-11-11 DIAGNOSIS — D631 Anemia in chronic kidney disease: Secondary | ICD-10-CM | POA: Insufficient documentation

## 2023-11-11 DIAGNOSIS — E1129 Type 2 diabetes mellitus with other diabetic kidney complication: Secondary | ICD-10-CM | POA: Diagnosis not present

## 2023-11-11 DIAGNOSIS — E1159 Type 2 diabetes mellitus with other circulatory complications: Secondary | ICD-10-CM | POA: Diagnosis not present

## 2023-11-11 DIAGNOSIS — Z9641 Presence of insulin pump (external) (internal): Secondary | ICD-10-CM | POA: Diagnosis not present

## 2023-11-11 DIAGNOSIS — E1121 Type 2 diabetes mellitus with diabetic nephropathy: Secondary | ICD-10-CM | POA: Diagnosis not present

## 2023-11-11 DIAGNOSIS — E113393 Type 2 diabetes mellitus with moderate nonproliferative diabetic retinopathy without macular edema, bilateral: Secondary | ICD-10-CM | POA: Diagnosis not present

## 2023-11-11 DIAGNOSIS — E1122 Type 2 diabetes mellitus with diabetic chronic kidney disease: Secondary | ICD-10-CM | POA: Diagnosis not present

## 2023-11-11 DIAGNOSIS — M810 Age-related osteoporosis without current pathological fracture: Secondary | ICD-10-CM | POA: Diagnosis not present

## 2023-11-11 MED ORDER — ATORVASTATIN CALCIUM 40 MG PO TABS: 40.0000 mg | ORAL_TABLET | Freq: Every day | ORAL | 3 refills | Status: AC

## 2023-11-11 NOTE — Patient Instructions (Signed)
 Medication Instructions:  Your physician recommends that you continue on your current medications as directed. Please refer to the Current Medication list given to you today.   *If you need a refill on your cardiac medications before your next appointment, please call your pharmacy*  Lab Work: No labs ordered today  If you have labs (blood work) drawn today and your tests are completely normal, you will receive your results only by: MyChart Message (if you have MyChart) OR A paper copy in the mail If you have any lab test that is abnormal or we need to change your treatment, we will call you to review the results.  Testing/Procedures: No test ordered today   Follow-Up: At [facility], you and your health needs are our priority.  As part of our continuing mission to provide you with exceptional heart care, our providers are all part of one team.  This team includes your primary Cardiologist (physician) and Advanced Practice Providers or APPs (Physician Assistants and Nurse Practitioners) who all work together to provide you with the care you need, when you need it.  Your next appointment:   6 month(s)  Provider:   You may see Redell Cave, MD or one of the following Advanced Practice Providers on your designated Care Team:   Lonni Meager, NP Lesley Maffucci, PA-C Bernardino Bring, PA-C Cadence [place], PA-C Tylene Lunch, NP Barnie Hila, NP    We recommend signing up for the patient portal called MyChart.  Sign up information is provided on this After Visit Summary.  MyChart is used to connect with patients for Virtual Visits (Telemedicine).  Patients are able to view lab/test results, encounter notes, upcoming appointments, etc.  Non-urgent messages can be sent to your provider as well.   To learn more about what you can do with MyChart, go to [email or web address].

## 2023-11-11 NOTE — Progress Notes (Signed)
 Cardiology Office Note   Date:  11/11/2023  ID:  Jocelyn Wells, DOB 03-12-1955, MRN [id number] PCP: Donzella Lauraine SAILOR, DO  [facility] Providers Cardiologist:  Redell Cave, MD     History of Present Illness Jocelyn Wells is a 68 y.o. female with a past medical history of anxiety, chronic HFpEF, nonischemic cardiomyopathy, peripheral arterial disease, hypertension, diabetes, former smoker, end-stage renal disease on hemodialysis, COPD, CVA, chronic DVT on OAC, lumbar spinal stenosis, with severe back pain, who is here today for follow-up.   Diagnosed with systolic congestive heart failure twice a day with an EF of 45% that normalized to 60-65% 2019.  CVA due to thrombosis of the left middle cerebral artery status post ILR placement in 2017.  Chronic anticoagulant due to DVT.  Echocardiogram completed 09/03/2022 revealed an LVEF of 50-55%, G1 DD, without valvular abnormalities noted.     In 08/2021 stent placement to the left superficial femoral artery, popliteal, percutaneous transluminal angioplasty of left common iliac artery, percutaneous angioplasty of the left peroneal artery and mechanical thrombectomy of the left tibioperoneal trunk.  Hospital course was complicated by acute renal failure requiring temporary dialysis.  She subsequently returned to her baseline stage IV CKD.  Was admitted to [facility] 01/01/2022 with shortness of breath.  Hemoglobin is noted to be 6.7.  She was transfused and diuresed with furosemide .  She did follow with nephrology for AKI.  She was able to be discharged on 01/04/2022.  Outpatient colonoscopy and EGD in January 2024 continue to be on iron  infusions.  She follows with vascular for evaluation of left brachiocephalic fistula placement.  AV fistula was placed on 06/18/2022.  She tolerated procedure well without any immediate complications.  She was seen in clinic 11/20/2022 with an appointment fairly well.  She recently increased mental fatigue related to  hemodialysis.  She also recently had extensive GI workup and the plan was to continue with hematology for iron  deficiency anemia.  There were no medication changes that were made or further testing that was ordered.   She was last seen in clinic 03/21/2023 accompanied by family member.  She states she been doing well from a cardiac perspective.  States that her fluid had been maintained by dialysis.  She states she did miss 1 dialysis run earlier in the week because she felt sick she had the flu and had been sick.  She also had issues with her AV fistula and had some revision work completed by vascular.  There were no medication changes ever made and further testing that was ordered at that time.  She returns clinic today accompanied by her husband.  She states that overall she has been doing fairly well.  She recently just had her second cataract surgery completed.  She states that she continues to have drops in her blood pressure on dialysis.  She is no longer being followed by vein and vascular local she is being followed by [facility] vein and vascular in [place] due to transportation and availability issues as she has clotting issues with her fistula.  She has been compliant with hemodialysis.  Has not noted any bleeding with blood noted in her urine or stool.  She did have to hold her apixaban  for her cataract surgery.  Denies any recent hospitalizations or visits to the emergency department  ROS: 10 point review of systems was reviewed and considered negative except ones were listed in the HPI  Studies Reviewed EKG Interpretation Date/Time:  Friday November 11 2023  15:07:00 EDT Ventricular Rate:  69 PR Interval:  194 QRS Duration:  156 QT Interval:  450 QTC Calculation: 482 R Axis:   -79  Text Interpretation: Normal sinus rhythm Right bundle branch block Left anterior fascicular block Bifascicular block When compared with ECG of 02-Sep-2023 17:42, No significant change since last tracing Confirmed  by Gerard Frederick ([id number]) on 11/11/2023 3:15:37 PM    TTE 09/03/22 1. Left ventricular ejection fraction, by estimation, is 50 to 55%. The  left ventricle has low normal function. Unable to exclude regional wall  motion abnormailty in the setting of bundle branch block, possible mild  inferior wall hypokinesis (difficult  to visualize, definity not used). There is moderate left ventricular  hypertrophy. Left ventricular diastolic parameters are consistent with  Grade I diastolic dysfunction (impaired relaxation).   2. Right ventricular systolic function is normal. The right ventricular  size is normal. Mildly increased right ventricular wall thickness. There  is mildly elevated pulmonary artery systolic pressure. The estimated right  ventricular systolic pressure is  38.2 mmHg.   3. Left atrial size was moderately dilated.   4. The mitral valve is normal in structure. No evidence of mitral valve  regurgitation. No evidence of mitral stenosis.   5. The aortic valve has an indeterminant number of cusps. Aortic valve  regurgitation is not visualized. Aortic valve sclerosis is present, with  no evidence of aortic valve stenosis.   6. The inferior vena cava is normal in size with greater than 50%  respiratory variability, suggesting right atrial pressure of 3 mmHg.  Risk Assessment/Calculations           Physical Exam VS:  BP 138/64   Pulse 69   Ht 5' 6 (1.676 m)   Wt 205 lb (93 kg)   SpO2 93%   BMI 33.09 kg/m        Wt Readings from Last 3 Encounters:  11/11/23 205 lb (93 kg)  09/28/23 200 lb (90.7 kg)  09/12/23 215 lb 6.4 oz (97.7 kg)    GEN: Well nourished, well developed in no acute distress NECK: No JVD; No carotid bruits CARDIAC: RRR, no murmurs, rubs, gallops RESPIRATORY:  Clear to auscultation without rales, wheezing or rhonchi  ABDOMEN: Soft, non-tender, non-distended EXTREMITIES: Trace pretibial edema; No deformity; AV fistula to the left brachial  ASSESSMENT AND  PLAN Chronic HFimpEF with a last LVEF of 50-55% on echocardiogram completed on 09/03/2022.  She is not on ACE/ARB/ARNI/MRA/SGLT2 inhibitor due to end-stage renal disease.  She did not tolerate beta-blocker therapy due to hypotension.  Previously did not tolerate beta-blocker therapy due to hypotension.  She is continued on torsemide  60 mg daily.  No changes were made to her current medications management Limited only ability for GDMT with hypotension and end-stage renal disease.  EKG today reveals sinus rhythm with a rate of 69 with right bundle branch block left intrafascicular block with no acute ischemic changes noted from prior studies.  She appears to be euvolemic on exam today continues to suffer from NYHA class II symptoms.  Peripheral arterial disease where she underwent stent placement to the left AV fistula.  Hospital stay was complicated by AKI requiring dialysis or hospitalization.  AV fistula was recently placed by [facility] with revision.  She is continued on apixaban  and lieu of aspirin  and atorvastatin  40 mg daily.  She continues to be followed by [facility] vein [facility] vascular.  Hypertension with episodes of hypotension during hemodialysis.  Blood pressure today is 138/64.  She  is continued on diuretic therapy only.  She has been encouraged to monitor pressure 1 to 2 hours postmedication administration as well.  Mixed hyperlipidemia with associated type 2 diabetes with an LDL 61.  She has been continued on atorvastatin  40 mg daily with prescription sent into the pharmacy of choice.  Ongoing management of her diabetes continues to be followed by her PCP.  Chronic DVT to the right femoral vein in the right lower extremity that has been maintained on chronic anticoagulation with apixaban  5 mg twice daily.  She does not meet reduced dosing criteria at this point.  End-stage renal disease on hemodialysis on Tuesday, Thursday, and Saturday.  Ongoing management per nephrology and fluid management is done  through dialysis.  Anemia of chronic kidney disease she has required several blood transfusions and iron  transfusions on an outpatient basis.  Ongoing management by hematology.  COPD which is longstanding she continues to use 2 L of O2 via nasal cannula at night.  Ongoing management per pulmonary.  Bifascicular block noted on EKG with right bundle branch block left intrafascicular block.  Will continue to monitor with surveillance studies.  She continues to remain off of AV nodal blocking agents.       Dispo: Patient to return to clinic to see MD/APP in 6 months or sooner if needed for further evaluation  Signed, Justo Hengel, NP

## 2023-11-12 DIAGNOSIS — Z23 Encounter for immunization: Secondary | ICD-10-CM | POA: Diagnosis not present

## 2023-11-12 DIAGNOSIS — D509 Iron deficiency anemia, unspecified: Secondary | ICD-10-CM | POA: Diagnosis not present

## 2023-11-12 DIAGNOSIS — Z992 Dependence on renal dialysis: Secondary | ICD-10-CM | POA: Diagnosis not present

## 2023-11-12 DIAGNOSIS — D631 Anemia in chronic kidney disease: Secondary | ICD-10-CM | POA: Diagnosis not present

## 2023-11-12 DIAGNOSIS — N2581 Secondary hyperparathyroidism of renal origin: Secondary | ICD-10-CM | POA: Diagnosis not present

## 2023-11-12 DIAGNOSIS — N186 End stage renal disease: Secondary | ICD-10-CM | POA: Diagnosis not present

## 2023-11-12 NOTE — Progress Notes (Deleted)
 MRN : [id number]  Jocelyn Wells is a 68 y.o. (12-09-1955) female who presents with chief complaint of check circulation.  History of Present Illness:   The patient returns to the office for followup regarding atherosclerotic changes of the lower extremities and review of the noninvasive studies as well as evaluation of her dialysis access.    There have been no interval changes in lower extremity symptoms. No interval shortening of the patient's claudication distance or development of rest pain symptoms. No new ulcers or wounds have occurred since the last visit.   She reports dialysis has been going well.  She denies prolonged bleeding or difficulty with cannulation.  She denies hand pain.  She denies dialysis stating they are having any difficulties.   There have been no significant changes to the patient's overall health care.   The patient denies amaurosis fugax or recent TIA symptoms. There are no documented recent neurological changes noted. There is no history of DVT, PE or superficial thrombophlebitis. The patient denies recent episodes of angina or shortness of breath.    ABI Rt=1.08 and Lt=1.24  (previous ABI's Rt=0.75 and Lt=1.19).   Duplex ultrasound of the left arm brachial axillary AV graft demonstrates a flow volume of 2475 cc/min.  In the proximal graft there is a moderate change in diameter but there is not a true pulling of the velocity identified consistent with a greater than 70% stenosis.    No outpatient medications have been marked as taking for the 11/14/23 encounter (Appointment) with Jama, Cordella MATSU, MD.    Past Medical History:  Diagnosis Date   Acute ischemic left MCA stroke (HCC) 01/29/2015   a.) MRI brain 01/29/2015: acute left MCA territory infarcts involving the posterior left frontal lobe and left insular cortex   Acute renal failure superimposed on stage 4 chronic kidney  disease (HCC) 01/29/2015   Allergy    Anemia of chronic renal failure    Anxiety    Arthritis    CHF (congestive heart failure) (HCC)    Complication of anesthesia    a.) PONV; b.) awareness under anesthesia   COPD (chronic obstructive pulmonary disease) (HCC)    Depression    Dyspnea    ESRD (end stage renal disease) (HCC)    Headache    HFrEF (heart failure with reduced ejection fraction) (HCC)    a.) TTE 01/30/2015: EF 30-35%, diff HK, LAE, G2DD; b.) TTE 06/13/2015: EF 40-45%, diff HK, G1DD; c.) TTE 10/06/2017: EF 60-65% MAC, no IAS; d.) TTE 09/29/2021: EF 45-50%;, LVH, LAE, AoV sclerosis, G1DD; e.) TTE 02/24/2022: EF 60-65%, no IAS   HTN (hypertension)    Leukocytosis    Long term current use of anticoagulant    a.) apixaban    Long term current use of antithrombotics/antiplatelets    a.) clopidogrel    NICM (nonischemic cardiomyopathy) (HCC)    a.) TTE 01/30/2015: EF 30-35%; b.) R/LHC 03/25/2015: EF 25-30%, mRA 13, mPA 30, mPCWP 26, LVEDP 17, CO 3.66, CI 1.8; c.) TTE 06/13/2015: EF 40-45%; d.) TTE 10/06/2017: EF 60-65%; e.) TTE 09/29/2021: EF 45-50%; f.) TTE 02/24/2022: EF  60-65%   Obesity    On supplemental oxygen  by nasal cannula    a.) 2L/[place] at night   Osteoporosis    Peripheral vascular disease    Type 2 diabetes mellitus treated with insulin  [facility])     Past Surgical History:  Procedure Laterality Date   ABDOMINAL HYSTERECTOMY     ANKLE FRACTURE SURGERY Left 02/09/2000   AV FISTULA PLACEMENT Left 06/18/2022   Procedure: ARTERIOVENOUS (AV) FISTULA CREATION (BRACHIAL AXILLA);  Surgeon: Jama Cordella MATSU, MD;  Location: [facility] ORS;  Service: Vascular;  Laterality: Left;   BILATERAL SALPINGOOPHORECTOMY  02/08/1998   CARDIAC CATHETERIZATION N/A 03/25/2015   Procedure: Right/Left Heart Cath and Coronary Angiography;  Surgeon: Victory LELON Sharps, MD;  Location: [facility] INVASIVE CV LAB;  Service: Cardiovascular;  Laterality: N/A;   CESAREAN SECTION  02/09/1976   COLONOSCOPY WITH PROPOFOL   N/A 09/22/2016   Procedure: COLONOSCOPY WITH PROPOFOL ;  Surgeon: Viktoria Lamar DASEN, MD;  Location: [facility] ENDOSCOPY;  Service: Endoscopy;  Laterality: N/A;   COLONOSCOPY WITH PROPOFOL  N/A 02/15/2022   Procedure: COLONOSCOPY WITH PROPOFOL ;  Surgeon: Unk Corinn Skiff, MD;  Location: [facility] ENDOSCOPY;  Service: Gastroenterology;  Laterality: N/A;   DIALYSIS/PERMA CATHETER INSERTION N/A 01/19/2023   Procedure: DIALYSIS/PERMA CATHETER INSERTION;  Surgeon: Marea Selinda RAMAN, MD;  Location: [facility] INVASIVE CV LAB;  Service: Cardiovascular;  Laterality: N/A;   EP IMPLANTABLE DEVICE N/A 08/05/2015   Procedure: Loop Recorder Insertion;  Surgeon: Elspeth JAYSON Sage, MD;  Location: [facility] INVASIVE CV LAB;  Service: Cardiovascular;  Laterality: N/A;   ESOPHAGOGASTRODUODENOSCOPY N/A 02/15/2022   Procedure: ESOPHAGOGASTRODUODENOSCOPY (EGD);  Surgeon: Unk Corinn Skiff, MD;  Location: [facility] ENDOSCOPY;  Service: Gastroenterology;  Laterality: N/A;   ESOPHAGOGASTRODUODENOSCOPY (EGD) WITH PROPOFOL  N/A 09/22/2016   Procedure: ESOPHAGOGASTRODUODENOSCOPY (EGD) WITH PROPOFOL ;  Surgeon: Viktoria Lamar DASEN, MD;  Location: [facility] ENDOSCOPY;  Service: Endoscopy;  Laterality: N/A;   ESOPHAGOGASTRODUODENOSCOPY (EGD) WITH PROPOFOL  N/A 12/08/2016   Procedure: ESOPHAGOGASTRODUODENOSCOPY (EGD) WITH PROPOFOL ;  Surgeon: Viktoria Lamar DASEN, MD;  Location: [facility] ENDOSCOPY;  Service: Endoscopy;  Laterality: N/A;   ESOPHAGOGASTRODUODENOSCOPY (EGD) WITH PROPOFOL  N/A 10/12/2022   Procedure: ESOPHAGOGASTRODUODENOSCOPY (EGD) WITH PROPOFOL ;  Surgeon: Unk Corinn Skiff, MD;  Location: [facility] ENDOSCOPY;  Service: Gastroenterology;  Laterality: N/A;   FACIAL LACERATION REPAIR N/A 12/27/2022   Procedure: FACIAL LACERATION REPAIR;  Surgeon: Luciano Elspeth, MD;  Location: [facility] OR;  Service: ENT;  Laterality: N/A;   FRACTURE SURGERY     GIVENS CAPSULE STUDY N/A 04/19/2022   Procedure: GIVENS CAPSULE STUDY;  Surgeon: Unk Corinn Skiff, MD;  Location: [facility] ENDOSCOPY;   Service: Gastroenterology;  Laterality: N/A;   GIVENS CAPSULE STUDY N/A 10/12/2022   Procedure: GIVENS CAPSULE STUDY;  Surgeon: Unk Corinn Skiff, MD;  Location: [facility] ENDOSCOPY;  Service: Gastroenterology;  Laterality: N/A;   INSERTION HYBRID ANTERIOVENOUS GORTEX GRAFT  06/18/2022   Procedure: INSERTION HYBRID ANTERIOVENOUS GORTEX GRAFT;  Surgeon: Jama Cordella MATSU, MD;  Location: [facility] ORS;  Service: Vascular;;   KNEE ARTHROSCOPY Left 02/09/2003   LOWER EXTREMITY ANGIOGRAPHY Left 08/12/2021   Procedure: Lower Extremity Angiography;  Surgeon: Jama Cordella MATSU, MD;  Location: [facility] INVASIVE CV LAB;  Service: Cardiovascular;  Laterality: Left;   PERIPHERAL VASCULAR THROMBECTOMY Left 01/19/2023   Procedure: PERIPHERAL VASCULAR THROMBECTOMY;  Surgeon: Marea Selinda RAMAN, MD;  Location: [facility] INVASIVE CV LAB;  Service: Cardiovascular;  Laterality: Left;   TEE WITHOUT CARDIOVERSION N/A 01/31/2015   Procedure: TRANSESOPHAGEAL ECHOCARDIOGRAM (TEE);  Surgeon: Redell RAMAN Shallow, MD;  Location: [facility] ENDOSCOPY;  Service: Cardiovascular;  Laterality:  N/A;   TEMPORARY DIALYSIS CATHETER N/A 08/14/2021   Procedure: TEMPORARY DIALYSIS CATHETER;  Surgeon: Jama Cordella MATSU, MD;  Location: [facility] INVASIVE CV LAB;  Service: Cardiovascular;  Laterality: N/A;   TUBAL LIGATION  02/09/1976   ULNAR NERVE TRANSPOSITION  02/08/2006    Social History Social History   Tobacco Use   Smoking status: Some Days    Current packs/day: 0.00    Average packs/day: 0.5 packs/day for 48.3 years (24.2 ttl pk-yrs)    Types: Cigarettes    Start date: 02/08/1974    Last attempt to quit: 05/31/2022    Years since quitting: 1.4   Smokeless tobacco: Never  Vaping Use   Vaping status: Never Used  Substance Use Topics   Alcohol use: No    Alcohol/week: 0.0 standard drinks of alcohol   Drug use: No    Family History Family History  Problem Relation Age of Onset   Heart disease Mother        died from CHF   Asthma Mother    Diabetes  Mother    Heart disease Father    Aneurysm Father    COPD Brother    Diabetes Brother    Alcohol abuse Paternal Aunt    Cancer Maternal Grandmother        unknownorigin   Anemia Neg Hx    Arrhythmia Neg Hx    Clotting disorder Neg Hx    Fainting Neg Hx    Heart attack Neg Hx    Heart failure Neg Hx    Hyperlipidemia Neg Hx    Hypertension Neg Hx     Allergies  Allergen Reactions   Ivp Dye [Iodinated Contrast Media] Anaphylaxis    Kidney Failure   Codeine Nausea And Vomiting     REVIEW OF SYSTEMS (Negative unless checked)  Constitutional: [] Weight loss  [] Fever  [] Chills Cardiac: [] Chest pain   [] Chest pressure   [] Palpitations   [] Shortness of breath when laying flat   [] Shortness of breath with exertion. Vascular:  [x] Pain in legs with walking   [] Pain in legs at rest  [x] History of DVT   [] Phlebitis   [] Swelling in legs   [] Varicose veins   [] Non-healing ulcers Pulmonary:   [] Uses home oxygen    [] Productive cough   [] Hemoptysis   [] Wheeze  [x] COPD   [] Asthma Neurologic:  [] Dizziness   [] Seizures   [] History of stroke   [] History of TIA  [] Aphasia   [] Vissual changes   [] Weakness or numbness in arm   [] Weakness or numbness in leg Musculoskeletal:   [] Joint swelling   [x] Joint pain   [] Low back pain Hematologic:  [] Easy bruising  [] Easy bleeding   [] Hypercoagulable state   [] Anemic Gastrointestinal:  [] Diarrhea   [] Vomiting  [] Gastroesophageal reflux/heartburn   [] Difficulty swallowing. Genitourinary:  [x] Chronic kidney disease   [] Difficult urination  [] Frequent urination   [] Blood in urine Skin:  [] Rashes   [] Ulcers  Psychological:  [] History of anxiety   []  History of major depression.  Physical Examination  There were no vitals filed for this visit. There is no height or weight on file to calculate BMI. Gen: WD/WN, NAD Head: [place]/AT, No temporalis wasting.  Ear/Nose/Throat: Hearing grossly intact, nares w/o erythema or drainage Eyes: PER, EOMI, sclera nonicteric.   Neck: Supple, no masses.  No bruit or JVD.  Pulmonary:  Good air movement, no audible wheezing, no use of accessory muscles.  Cardiac: RRR, normal S1, S2, no Murmurs. Vascular:  mild trophic changes, no open wounds Vessel  Right Left  Radial Palpable Palpable  PT Not Palpable Not Palpable  DP Not Palpable Not Palpable  Gastrointestinal: soft, non-distended. No guarding/no peritoneal signs.  Musculoskeletal: M/S 5/5 throughout.  No visible deformity.  Neurologic: CN 2-12 intact. Pain and light touch intact in extremities.  Symmetrical.  Speech is fluent. Motor exam as listed above. Psychiatric: Judgment intact, Mood & affect appropriate for pt's clinical situation. Dermatologic: No rashes or ulcers noted.  No changes consistent with cellulitis.   CBC Lab Results  Component Value Date   WBC 7.7 09/21/2023   HGB 9.8 (L) 09/21/2023   HCT 31.9 (L) 09/21/2023   MCV 105.3 (H) 09/21/2023   PLT 436 (H) 09/21/2023    BMET    Component Value Date/Time   NA 142 09/02/2023 1732   NA 140 12/04/2021 1542   K 3.6 09/02/2023 1732   CL 104 09/02/2023 1732   CO2 24 09/02/2023 1732   GLUCOSE 302 (H) 09/02/2023 1732   BUN 48 (H) 09/02/2023 1732   BUN 38 (H) 12/04/2021 1542   CREATININE 4.32 (H) 09/02/2023 1732   CREATININE 4.33 (H) 03/07/2023 1548   CREATININE 1.37 (H) 03/21/2015 1451   CALCIUM  6.9 (L) 09/02/2023 1732   GFRNONAA 11 (L) 09/02/2023 1732   GFRNONAA 11 (L) 03/07/2023 1548   GFRNONAA 13 04/22/2022 1233   GFRAA 33 (L) 04/23/2019 1446   CrCl cannot be calculated (Patient's most recent lab result is older than the maximum 21 days allowed.).  COAG Lab Results  Component Value Date   INR 1.6 (H) 12/27/2022   INR 1.5 (H) 08/14/2021   INR 0.93 10/05/2017    Radiology No results found.   Assessment/Plan There are no diagnoses linked to this encounter.   Cordella Shawl, MD  11/12/2023 12:17 PM

## 2023-11-13 ENCOUNTER — Telehealth: Payer: Self-pay

## 2023-11-13 NOTE — Telephone Encounter (Unsigned)
 Copied from CRM [id number]. Topic: Referral - Question >> Nov 11, 2023  4:16 PM Delon DASEN wrote: Reason for CRM: supposed to have a referral for mammogram- need follow up- please call [phone]

## 2023-11-14 ENCOUNTER — Ambulatory Visit (HOSPITAL_BASED_OUTPATIENT_CLINIC_OR_DEPARTMENT_OTHER): Admitting: Nurse Practitioner

## 2023-11-14 ENCOUNTER — Other Ambulatory Visit: Payer: Self-pay

## 2023-11-14 ENCOUNTER — Encounter (INDEPENDENT_AMBULATORY_CARE_PROVIDER_SITE_OTHER)

## 2023-11-14 ENCOUNTER — Ambulatory Visit (INDEPENDENT_AMBULATORY_CARE_PROVIDER_SITE_OTHER): Admitting: Vascular Surgery

## 2023-11-14 DIAGNOSIS — I82511 Chronic embolism and thrombosis of right femoral vein: Secondary | ICD-10-CM

## 2023-11-14 DIAGNOSIS — I10 Essential (primary) hypertension: Secondary | ICD-10-CM

## 2023-11-14 DIAGNOSIS — J449 Chronic obstructive pulmonary disease, unspecified: Secondary | ICD-10-CM

## 2023-11-14 DIAGNOSIS — Z1231 Encounter for screening mammogram for malignant neoplasm of breast: Secondary | ICD-10-CM

## 2023-11-14 DIAGNOSIS — I70213 Atherosclerosis of native arteries of extremities with intermittent claudication, bilateral legs: Secondary | ICD-10-CM

## 2023-11-14 DIAGNOSIS — Z91199 Patient's noncompliance with other medical treatment and regimen due to unspecified reason: Secondary | ICD-10-CM

## 2023-11-14 DIAGNOSIS — N186 End stage renal disease: Secondary | ICD-10-CM

## 2023-11-14 DIAGNOSIS — G894 Chronic pain syndrome: Secondary | ICD-10-CM

## 2023-11-14 NOTE — Progress Notes (Signed)
 11/14/2023-No show (Patient is sick)

## 2023-11-15 DIAGNOSIS — D509 Iron deficiency anemia, unspecified: Secondary | ICD-10-CM | POA: Diagnosis not present

## 2023-11-15 DIAGNOSIS — Z992 Dependence on renal dialysis: Secondary | ICD-10-CM | POA: Diagnosis not present

## 2023-11-15 DIAGNOSIS — D631 Anemia in chronic kidney disease: Secondary | ICD-10-CM | POA: Diagnosis not present

## 2023-11-15 DIAGNOSIS — N2581 Secondary hyperparathyroidism of renal origin: Secondary | ICD-10-CM | POA: Diagnosis not present

## 2023-11-15 DIAGNOSIS — N186 End stage renal disease: Secondary | ICD-10-CM | POA: Diagnosis not present

## 2023-11-15 DIAGNOSIS — Z23 Encounter for immunization: Secondary | ICD-10-CM | POA: Diagnosis not present

## 2023-11-17 DIAGNOSIS — N186 End stage renal disease: Secondary | ICD-10-CM | POA: Diagnosis not present

## 2023-11-17 DIAGNOSIS — Z23 Encounter for immunization: Secondary | ICD-10-CM | POA: Diagnosis not present

## 2023-11-17 DIAGNOSIS — Z794 Long term (current) use of insulin: Secondary | ICD-10-CM | POA: Diagnosis not present

## 2023-11-17 DIAGNOSIS — E1140 Type 2 diabetes mellitus with diabetic neuropathy, unspecified: Secondary | ICD-10-CM | POA: Diagnosis not present

## 2023-11-17 DIAGNOSIS — D631 Anemia in chronic kidney disease: Secondary | ICD-10-CM | POA: Diagnosis not present

## 2023-11-17 DIAGNOSIS — Z992 Dependence on renal dialysis: Secondary | ICD-10-CM | POA: Diagnosis not present

## 2023-11-17 DIAGNOSIS — B351 Tinea unguium: Secondary | ICD-10-CM | POA: Diagnosis not present

## 2023-11-17 DIAGNOSIS — D509 Iron deficiency anemia, unspecified: Secondary | ICD-10-CM | POA: Diagnosis not present

## 2023-11-17 DIAGNOSIS — N2581 Secondary hyperparathyroidism of renal origin: Secondary | ICD-10-CM | POA: Diagnosis not present

## 2023-11-18 DIAGNOSIS — D631 Anemia in chronic kidney disease: Secondary | ICD-10-CM | POA: Diagnosis not present

## 2023-11-18 DIAGNOSIS — N186 End stage renal disease: Secondary | ICD-10-CM | POA: Diagnosis not present

## 2023-11-18 DIAGNOSIS — N2581 Secondary hyperparathyroidism of renal origin: Secondary | ICD-10-CM | POA: Diagnosis not present

## 2023-11-18 DIAGNOSIS — Z992 Dependence on renal dialysis: Secondary | ICD-10-CM | POA: Diagnosis not present

## 2023-11-18 DIAGNOSIS — Z23 Encounter for immunization: Secondary | ICD-10-CM | POA: Diagnosis not present

## 2023-11-18 DIAGNOSIS — D509 Iron deficiency anemia, unspecified: Secondary | ICD-10-CM | POA: Diagnosis not present

## 2023-11-19 DIAGNOSIS — Z992 Dependence on renal dialysis: Secondary | ICD-10-CM | POA: Diagnosis not present

## 2023-11-19 DIAGNOSIS — D631 Anemia in chronic kidney disease: Secondary | ICD-10-CM | POA: Diagnosis not present

## 2023-11-19 DIAGNOSIS — D509 Iron deficiency anemia, unspecified: Secondary | ICD-10-CM | POA: Diagnosis not present

## 2023-11-19 DIAGNOSIS — Z23 Encounter for immunization: Secondary | ICD-10-CM | POA: Diagnosis not present

## 2023-11-19 DIAGNOSIS — N2581 Secondary hyperparathyroidism of renal origin: Secondary | ICD-10-CM | POA: Diagnosis not present

## 2023-11-19 DIAGNOSIS — N186 End stage renal disease: Secondary | ICD-10-CM | POA: Diagnosis not present

## 2023-11-21 ENCOUNTER — Ambulatory Visit: Attending: Nurse Practitioner | Admitting: Nurse Practitioner

## 2023-11-21 ENCOUNTER — Encounter: Payer: Self-pay | Admitting: Nurse Practitioner

## 2023-11-21 VITALS — BP 126/52 | HR 65 | Temp 97.1°F | Resp 16 | Ht 66.0 in | Wt 200.0 lb

## 2023-11-21 DIAGNOSIS — E1140 Type 2 diabetes mellitus with diabetic neuropathy, unspecified: Secondary | ICD-10-CM | POA: Diagnosis not present

## 2023-11-21 DIAGNOSIS — E1159 Type 2 diabetes mellitus with other circulatory complications: Secondary | ICD-10-CM | POA: Diagnosis not present

## 2023-11-21 DIAGNOSIS — I10 Essential (primary) hypertension: Secondary | ICD-10-CM | POA: Diagnosis not present

## 2023-11-21 DIAGNOSIS — Z1331 Encounter for screening for depression: Secondary | ICD-10-CM | POA: Diagnosis not present

## 2023-11-21 DIAGNOSIS — M810 Age-related osteoporosis without current pathological fracture: Secondary | ICD-10-CM | POA: Diagnosis not present

## 2023-11-21 DIAGNOSIS — G8929 Other chronic pain: Secondary | ICD-10-CM | POA: Diagnosis not present

## 2023-11-21 DIAGNOSIS — E1121 Type 2 diabetes mellitus with diabetic nephropathy: Secondary | ICD-10-CM | POA: Diagnosis not present

## 2023-11-21 DIAGNOSIS — M1712 Unilateral primary osteoarthritis, left knee: Secondary | ICD-10-CM | POA: Diagnosis not present

## 2023-11-21 DIAGNOSIS — M170 Bilateral primary osteoarthritis of knee: Secondary | ICD-10-CM

## 2023-11-21 DIAGNOSIS — M25562 Pain in left knee: Secondary | ICD-10-CM | POA: Diagnosis not present

## 2023-11-21 DIAGNOSIS — N186 End stage renal disease: Secondary | ICD-10-CM | POA: Diagnosis not present

## 2023-11-21 DIAGNOSIS — E113319 Type 2 diabetes mellitus with moderate nonproliferative diabetic retinopathy with macular edema, unspecified eye: Secondary | ICD-10-CM | POA: Insufficient documentation

## 2023-11-21 DIAGNOSIS — E1142 Type 2 diabetes mellitus with diabetic polyneuropathy: Secondary | ICD-10-CM | POA: Diagnosis not present

## 2023-11-21 DIAGNOSIS — Z9641 Presence of insulin pump (external) (internal): Secondary | ICD-10-CM | POA: Diagnosis not present

## 2023-11-21 DIAGNOSIS — E1165 Type 2 diabetes mellitus with hyperglycemia: Secondary | ICD-10-CM | POA: Diagnosis not present

## 2023-11-21 DIAGNOSIS — E1122 Type 2 diabetes mellitus with diabetic chronic kidney disease: Secondary | ICD-10-CM | POA: Diagnosis not present

## 2023-11-21 DIAGNOSIS — M1711 Unilateral primary osteoarthritis, right knee: Secondary | ICD-10-CM | POA: Diagnosis not present

## 2023-11-21 DIAGNOSIS — G894 Chronic pain syndrome: Secondary | ICD-10-CM | POA: Diagnosis not present

## 2023-11-21 DIAGNOSIS — E1129 Type 2 diabetes mellitus with other diabetic kidney complication: Secondary | ICD-10-CM | POA: Diagnosis not present

## 2023-11-21 DIAGNOSIS — Z794 Long term (current) use of insulin: Secondary | ICD-10-CM | POA: Diagnosis not present

## 2023-11-21 DIAGNOSIS — E113393 Type 2 diabetes mellitus with moderate nonproliferative diabetic retinopathy without macular edema, bilateral: Secondary | ICD-10-CM | POA: Diagnosis not present

## 2023-11-21 MED ORDER — TRAMADOL HCL 50 MG PO TABS: 50.0000 mg | ORAL_TABLET | Freq: Two times a day (BID) | ORAL | 2 refills | Status: AC | PRN

## 2023-11-21 NOTE — Progress Notes (Signed)
 Nursing Pain Medication Assessment:  Safety precautions to be maintained throughout the outpatient stay will include: orient to surroundings, keep bed in low position, maintain call bell within reach at all times, provide assistance with transfer out of bed and ambulation.  Medication Inspection Compliance: Pill count conducted under aseptic conditions, in front of the patient. Neither the pills nor the bottle was removed from the patient's sight at any time. Once count was completed pills were immediately returned to the patient in their original bottle.  Medication: Tramadol  (Ultram ) Pill/Patch Count: 24 of 60 pills/patches remain Pill/Patch Appearance: Markings consistent with prescribed medication Bottle Appearance: Standard pharmacy container. Clearly labeled. Filled Date: 09 / 06 / 2025 Last Medication intake:  YesterdaySafety precautions to be maintained throughout the outpatient stay will include: orient to surroundings, keep bed in low position, maintain call bell within reach at all times, provide assistance with transfer out of bed and ambulation.

## 2023-11-21 NOTE — Progress Notes (Signed)
 PROVIDER NOTE: Interpretation of information contained herein should be left to medically-trained personnel. Specific patient instructions are provided elsewhere under Patient Instructions section of medical record. This document was created in part using AI and STT-dictation technology, any transcriptional errors that may result from this process are unintentional.  Patient: Jocelyn Wells  Service: E/M   PCP: Donzella Lauraine SAILOR, DO  DOB: 1955-08-31  DOS: 11/21/2023  Provider: Emmy MARLA Blanch, NP  MRN: [id number]  Delivery: Face-to-face  Specialty: Interventional Pain Management  Type: Established Patient  Setting: Ambulatory outpatient facility  Specialty designation: 09  Referring Prov.: Donzella Lauraine SAILOR, DO  Location: Outpatient office facility       History of present illness (HPI) [name], a 68 y.o. year old female, is here today because of her Chronic pain syndrome [G89.4]. [name] primary complain today is Knee Pain (bilateral)  Pertinent problems: [name]  has H/O: osteoarthritis; Anxiety, generalized; Arthritis, degenerative; Morbid obesity due to excess calories (HCC); Compression fracture of L1 lumbar vertebra (HCC); Compression fracture of body of thoracic vertebra (HCC); Lumbar foraminal stenosis (RIGHT L1); and Spinal stenosis of lumbar region with neurogenic claudication (L4-L5) on their pertinent problem list.  Pain Assessment: Severity of Chronic pain is reported as a 5 /10. Location: Knee Right, Left/left lower let. Onset:  . Quality: Sharp. Timing: Intermittent. Modifying factor(s): injections, tramadol . Vitals:  height is 5' 6 (1.676 m) and weight is 200 lb (90.7 kg). Her temporal temperature is 97.1 F (36.2 C) (abnormal). Her blood pressure is 126/52 (abnormal) and her pulse is 65. Her respiration is 16 and oxygen  saturation is 96%.  BMI: Estimated body mass index is 32.28 kg/m as calculated from the following:   Height as of this encounter: 5' 6 (1.676 m).    Weight as of this encounter: 200 lb (90.7 kg).  Last encounter: 11/14/2023. Last procedure: Visit date not found.  Reason for encounter: medication management.  No change in medical history since last visit.  Patient's pain is at baseline.  Patient continues multimodal pain regimen as prescribed.  States that it provides pain relief and improvement in functional status.  The patient continues to experience bilateral knee pain; however, see reports significant pain relief and improved functional mobility following the knee injection.  The patient continues to experience bilateral foot pain and burning sensation.  She previously received Qutenza  treatment, which provided significant pain relief for approximately 5 months.  Due to recent flareups in both feet, she is requesting to repeat the Qutenza  treatment.   Discussed the use of AI scribe software for clinical note transcription with the patient, who gave verbal consent to proceed.  History of Present Illness   Jocelyn Wells is a 68 year old female who presents with left knee pain. Her husband is present and waiting outside. She has received knee injections from Dr. Marcelino, which have provided some relief.  She experiences intermittent pain in her left knee, primarily when standing up, such as when getting out of bed. The pain is less severe when sitting down. Previously, the pain level was at a 10 before receiving knee injections, but it has decreased to a 5 after the injections. She holds onto her dresser for support when standing to alleviate the pain.  She last received her Qutenza  treatment five months ago and would like to have it reordered.  She is currently taking tramadol  50 mg, which she tries to take once daily, although the prescription is for twice daily  use. No side effects from the tramadol , such as constipation, have been reported.  She has had cataract surgery, which has affected her ability to see close up, necessitating the  use of reading glasses. She is in the process of finding a pair that works for her.     Pharmacotherapy Assessment   Tramadol  (Ultram ) 50 mg tab 2 times per day as needed for her pain. MME=20 Monitoring: [place] PMP: PDMP reviewed during this encounter.       Pharmacotherapy: No side-effects or adverse reactions reported. Compliance: No problems identified. Effectiveness: Clinically acceptable.  [name], RN  11/21/2023 11:44 AM  Sign when Signing Visit Nursing Pain Medication Assessment:  Safety precautions to be maintained throughout the outpatient stay will include: orient to surroundings, keep bed in low position, maintain call bell within reach at all times, provide assistance with transfer out of bed and ambulation.  Medication Inspection Compliance: Pill count conducted under aseptic conditions, in front of the patient. Neither the pills nor the bottle was removed from the patient's sight at any time. Once count was completed pills were immediately returned to the patient in their original bottle.  Medication: Tramadol  (Ultram ) Pill/Patch Count: 24 of 60 pills/patches remain Pill/Patch Appearance: Markings consistent with prescribed medication Bottle Appearance: Standard pharmacy container. Clearly labeled. Filled Date: 09 / 06 / 2025 Last Medication intake:  YesterdaySafety precautions to be maintained throughout the outpatient stay will include: orient to surroundings, keep bed in low position, maintain call bell within reach at all times, provide assistance with transfer out of bed and ambulation.     UDS:  Summary  Date Value Ref Range Status  10/25/2022 Note  Final    Comment:    ==================================================================== [employer] 13 (MW) ==================================================================== Test                             Result       Flag       Units  Drug Present and Declared for Prescription Verification   Tramadol                         >13889       EXPECTED   ng/mg creat   O-Desmethyltramadol            3197         EXPECTED   ng/mg creat   N-Desmethyltramadol            7950         EXPECTED   ng/mg creat    Source of tramadol  is a prescription medication. O-desmethyltramadol    and N-desmethyltramadol are expected metabolites of tramadol .  Drug Present not Declared for Prescription Verification   Alcohol, Ethyl                 0.041        UNEXPECTED g/dL    Sources of ethyl alcohol include alcoholic beverages or as a    fermentation product of glucose; glucose is present in this specimen.    Interpret result with caution, as the presence of ethyl alcohol is    likely due, at least in part, to fermentation of glucose.  ==================================================================== Test                      Result    Flag   Units      Ref Range  Creatinine              36               mg/dL      >=79 ==================================================================== Declared Medications:  The flagging and interpretation on this report are based on the  following declared medications.  Unexpected results may arise from  inaccuracies in the declared medications.   **Note: The testing scope of this panel includes these medications:   Tramadol  (Ultram )   **Note: The testing scope of this panel does not include the  following reported medications:   Albuterol   Apixaban  (Eliquis )  Atorvastatin  (Lipitor)  Bupropion  (Wellbutrin  SR)  Calcitriol   Dulaglutide  (Trulicity )  Fluticasone  (Flonase )  Gabapentin  (Neurontin )  Helium  Hydroxyurea  (Hydrea )  Insulin  (NovoLog )  Loratadine  (Claritin )  Meclizine  (Antivert )  Oxygen   Paroxetine  (Paxil )  Torsemide  (Demadex )  Vitamin D2 (Drisdol ) ==================================================================== For clinical consultation, please call [phone]. ====================================================================     No  results found for: CBDTHCR No results found for: D8THCCBX No results found for: D9THCCBX  ROS  Constitutional: Denies any fever or chills Gastrointestinal: No reported hemesis, hematochezia, vomiting, or acute GI distress Musculoskeletal: Bilateral knee pain, bilateral foot pain Neurological: No reported episodes of acute onset apraxia, aphasia, dysarthria, agnosia, amnesia, paralysis, loss of coordination, or loss of consciousness  Medication Review  Omnipod DASH Pods (Gen 4), Oxygen -Helium, PARoxetine , albuterol , apixaban , atorvastatin , buPROPion , calcium  carbonate, fluticasone , gabapentin , hydroxyurea , insulin  aspart, insulin  pump, loratadine , meclizine , optichamber diamond , torsemide , and traMADol   History Review  Allergy: Ms. Harr is allergic to ivp dye [iodinated contrast media] and codeine. Drug: Ms. Sandefer  reports no history of drug use. Alcohol:  reports no history of alcohol use. Tobacco:  reports that she has been smoking cigarettes. She started smoking about 49 years ago. She has a 24.2 pack-year smoking history. She has never used smokeless tobacco. Social: Ms. Lorenzo  reports that she has been smoking cigarettes. She started smoking about 49 years ago. She has a 24.2 pack-year smoking history. She has never used smokeless tobacco. She reports that she does not drink alcohol and does not use drugs. Medical:  has a past medical history of Acute ischemic left MCA stroke (HCC) (01/29/2015), Acute renal failure superimposed on stage 4 chronic kidney disease (HCC) (01/29/2015), Allergy, Anemia of chronic renal failure, Anxiety, Arthritis, CHF (congestive heart failure) (HCC), Complication of anesthesia, COPD (chronic obstructive pulmonary disease) (HCC), Depression, Dyspnea, ESRD (end stage renal disease) (HCC), Headache, HFrEF (heart failure with reduced ejection fraction) (HCC), HTN (hypertension), Leukocytosis, Long term current use of anticoagulant, Long term current use of  antithrombotics/antiplatelets, NICM (nonischemic cardiomyopathy) (HCC), Obesity, On supplemental oxygen  by nasal cannula, Osteoporosis, Peripheral vascular disease, and Type 2 diabetes mellitus treated with insulin  (HCC). Surgical: Ms. Saephan  has a past surgical history that includes Abdominal hysterectomy; Tubal ligation (02/09/1976); Cesarean section (02/09/1976); Knee arthroscopy (Left, 02/09/2003); Ulnar nerve transposition (02/08/2006); Bilateral salpingoophorectomy (02/08/1998); Ankle fracture surgery (Left, 02/09/2000); TEE without cardioversion (N/A, 01/31/2015); Cardiac catheterization (N/A, 03/25/2015); Cardiac catheterization (N/A, 08/05/2015); Esophagogastroduodenoscopy (egd) with propofol  (N/A, 09/22/2016); Colonoscopy with propofol  (N/A, 09/22/2016); Esophagogastroduodenoscopy (egd) with propofol  (N/A, 12/08/2016); Lower Extremity Angiography (Left, 08/12/2021); TEMPORARY DIALYSIS CATHETER (N/A, 08/14/2021); Fracture surgery; Colonoscopy with propofol  (N/A, 02/15/2022); Esophagogastroduodenoscopy (N/A, 02/15/2022); Givens capsule study (N/A, 04/19/2022); AV fistula placement (Left, 06/18/2022); Insertion hybrid anteriovenous gortex graft (06/18/2022); Esophagogastroduodenoscopy (egd) with propofol  (N/A, 10/12/2022); Givens capsule study (N/A, 10/12/2022); Facial laceration repair (N/A, 12/27/2022); PERIPHERAL VASCULAR THROMBECTOMY (Left, 01/19/2023); and DIALYSIS/PERMA CATHETER INSERTION (N/A, 01/19/2023). Family: family  history includes Alcohol abuse in her paternal aunt; Aneurysm in her father; Asthma in her mother; COPD in her brother; Cancer in her maternal grandmother; Diabetes in her brother and mother; Heart disease in her father and mother.  Laboratory Chemistry Profile   Renal Lab Results  Component Value Date   BUN 48 (H) 09/02/2023   CREATININE 4.32 (H) 09/02/2023   LABCREA 3.7 04/22/2022   BCR 15 12/04/2021   GFRAA 33 (L) 04/23/2019   GFRNONAA 11 (L) 09/02/2023    Hepatic Lab Results   Component Value Date   AST 29 06/29/2023   ALT 22 06/29/2023   ALBUMIN  3.5 06/29/2023   ALKPHOS 75 06/29/2023    Electrolytes Lab Results  Component Value Date   NA 142 09/02/2023   K 3.6 09/02/2023   CL 104 09/02/2023   CALCIUM  6.9 (L) 09/02/2023   MG 2.3 09/02/2023   PHOS 3.2 12/31/2022    Bone No results found for: VD25OH, VD125OH2TOT, CI6874NY7, CI7874NY7, 25OHVITD1, 25OHVITD2, 25OHVITD3, TESTOFREE, TESTOSTERONE  Inflammation (CRP: Acute Phase) (ESR: Chronic Phase) Lab Results  Component Value Date   ESRSEDRATE 23 10/20/2020   LATICACIDVEN 1.4 05/31/2020         Note: Above Lab results reviewed.  Recent Imaging Review  DG PAIN CLINIC C-ARM 1-60 MIN NO REPORT Fluoro was used, but no Radiologist interpretation will be provided.  Please refer to NOTES tab for provider progress note. Note: Reviewed        Physical Exam  Vitals: BP (!) 126/52 (Cuff Size: Normal)   Pulse 65   Temp (!) 97.1 F (36.2 C) (Temporal)   Resp 16   Ht 5' 6 (1.676 m)   Wt 200 lb (90.7 kg)   SpO2 96%   BMI 32.28 kg/m  BMI: Estimated body mass index is 32.28 kg/m as calculated from the following:   Height as of this encounter: 5' 6 (1.676 m).   Weight as of this encounter: 200 lb (90.7 kg). Ideal: Ideal body weight: 59.3 kg (130 lb 11.7 oz) Adjusted ideal body weight: 71.9 kg (158 lb 7 oz) General appearance: Well nourished, well developed, and well hydrated. In no apparent acute distress Mental status: Alert, oriented x 3 (person, place, & time)       Respiratory: No evidence of acute respiratory distress Eyes: PERLA  Musculoskeletal: Bilateral knee pain, bilateral foot pain Assessment   Diagnosis Status  1. Chronic pain syndrome   2. Chronic painful diabetic neuropathy (HCC)   3. Primary osteoarthritis of right knee   4. Primary osteoarthritis of left knee   5. Chronic pain of left knee   6. Type 2 diabetes mellitus with moderate nonproliferative  retinopathy and macular edema, with long-term current use of insulin , unspecified laterality (HCC)    Controlled Controlled Controlled   Updated Problems: No problems updated.  Plan of Care  Problem-specific:  Assessment and Plan  Chronic left knee pain Chronic left knee pain rated 5/10, worsened by activity, relieved by rest. - Continue tramadol  50 mg, one tablet by mouth twice daily.  Chronic pain syndrome Chronic pain syndrome managed with tramadol . No side effects or adverse reaction reported to medication.  Prescribing drug monitoring (PDMP) reviewed, findings consistent with the use of prescribed medication and no evidence of narcotic misuse or abuse.  Urine drug screening (UDS) up to date and consistent with the use of prescribed medication.  - Send tramadol  prescription to [facility] for three-month supply. - Reinforced tramadol  dosing: 50 mg twice daily.  Schedule (F2F) (MM) in 3 months with Emmy Blanch NP  Chronic painful diabetic neuropathy: She previously received Qutenza  treatment, which provided significant pain relief for approximately 5 months.  Due to recent flareups in both feet, she is requesting to repeat the Qutenza  treatment.  - Qutenza  overdue. - Schedule Qutenza  treatment with Dr. Marcelino for next week.  Plan: (Clinic): (B) Quentza Treatment # 4 with Dr. Marcelino       Ms. Taisley A Dacruz has a current medication list which includes the following long-term medication(s): albuterol , atorvastatin , bupropion , eliquis , fluticasone , insulin  pump, loratadine , paroxetine , optichamber diamond , and torsemide .  Pharmacotherapy (Medications Ordered): Meds ordered this encounter  Medications   traMADol  (ULTRAM ) 50 MG tablet    Sig: Take 1 tablet (50 mg total) by mouth 2 (two) times daily as needed.    Dispense:  60 tablet    Refill:  2   Orders:  Orders Placed This Encounter  Procedures   NEUROLYSIS    Please order Qutenza  patches    Standing  Status:   Future    Expiration Date:   02/21/2024    Where will this procedure be performed?:   [facility]        Return in about 1 week (around 11/28/2023) for (Clinic): (B) Quentza Treatment # 5 with Dr. Marcelino.    Recent Visits Date Type Provider Dept  09/28/23 Procedure visit Marcelino Nurse, MD [facility]  Showing recent visits within past 90 days and meeting all other requirements Today's Visits Date Type Provider Dept  11/21/23 Office Visit Zhoey Blackstock K, NP [facility]  Showing today's visits and meeting all other requirements Future Appointments Date Type Provider Dept  12/12/23 Appointment Marcelino Nurse, MD [facility]  02/15/24 Appointment Shere Eisenhart K, NP [facility]  Showing future appointments within next 90 days and meeting all other requirements  I discussed the assessment and treatment plan with the patient. The patient was provided an opportunity to ask questions and all were answered. The patient agreed with the plan and demonstrated an understanding of the instructions.  Patient advised to call back or seek an in-person evaluation if the symptoms or condition worsens.  I personally spent a total of 30 minutes in the care of the patient today including preparing to see the patient, getting/reviewing separately obtained history, performing a medically appropriate exam/evaluation, counseling and educating, placing orders, referring and communicating with other health care professionals, documenting clinical information in the EHR, independently interpreting results, communicating results, and coordinating care.    Note by: Glenroy Crossen K Britnee Mcdevitt, NP (TTS and AI technology used. I apologize for any typographical errors that were not detected and corrected.) Date: 11/21/2023; Time: 1:15 PM

## 2023-11-22 DIAGNOSIS — D509 Iron deficiency anemia, unspecified: Secondary | ICD-10-CM | POA: Diagnosis not present

## 2023-11-22 DIAGNOSIS — N2581 Secondary hyperparathyroidism of renal origin: Secondary | ICD-10-CM | POA: Diagnosis not present

## 2023-11-22 DIAGNOSIS — Z992 Dependence on renal dialysis: Secondary | ICD-10-CM | POA: Diagnosis not present

## 2023-11-22 DIAGNOSIS — N186 End stage renal disease: Secondary | ICD-10-CM | POA: Diagnosis not present

## 2023-11-22 DIAGNOSIS — D631 Anemia in chronic kidney disease: Secondary | ICD-10-CM | POA: Diagnosis not present

## 2023-11-22 DIAGNOSIS — Z23 Encounter for immunization: Secondary | ICD-10-CM | POA: Diagnosis not present

## 2023-11-24 ENCOUNTER — Encounter: Payer: Self-pay | Admitting: Family Medicine

## 2023-11-24 NOTE — Telephone Encounter (Signed)
 Called patient to let her know that the order has been placed for diagnostic.  Patient verbalized understanding.

## 2023-11-24 NOTE — Addendum Note (Signed)
 Addended by: TERREL POWELL CROME on: 11/24/2023 04:14 PM   Modules accepted: Orders

## 2023-11-24 NOTE — Telephone Encounter (Signed)
 LVM for patient and relayed message per provider, will attempt to call again later.

## 2023-11-25 ENCOUNTER — Telehealth: Payer: Self-pay

## 2023-11-25 NOTE — Telephone Encounter (Signed)
 Copied from CRM #[id number]. Topic: General - Other >> Nov 25, 2023 11:46 AM Charlet HERO wrote: Reason for CRM: patient is calling to spreak with Powell she is stating that she spoke with her on yesterday and she has another question to ask her. Informed the patient will get call back by the close of business today

## 2023-11-25 NOTE — Telephone Encounter (Signed)
 Called patient back and all she needed was to make sure her appointment for 11/28/2023 @ 1:00 pm had been cancelled.

## 2023-11-26 DIAGNOSIS — N186 End stage renal disease: Secondary | ICD-10-CM | POA: Diagnosis not present

## 2023-11-26 DIAGNOSIS — D631 Anemia in chronic kidney disease: Secondary | ICD-10-CM | POA: Diagnosis not present

## 2023-11-26 DIAGNOSIS — N2581 Secondary hyperparathyroidism of renal origin: Secondary | ICD-10-CM | POA: Diagnosis not present

## 2023-11-26 DIAGNOSIS — D509 Iron deficiency anemia, unspecified: Secondary | ICD-10-CM | POA: Diagnosis not present

## 2023-11-26 DIAGNOSIS — Z23 Encounter for immunization: Secondary | ICD-10-CM | POA: Diagnosis not present

## 2023-11-26 DIAGNOSIS — Z992 Dependence on renal dialysis: Secondary | ICD-10-CM | POA: Diagnosis not present

## 2023-11-28 ENCOUNTER — Ambulatory Visit: Admitting: Family Medicine

## 2023-11-29 DIAGNOSIS — Z992 Dependence on renal dialysis: Secondary | ICD-10-CM | POA: Diagnosis not present

## 2023-11-29 DIAGNOSIS — D631 Anemia in chronic kidney disease: Secondary | ICD-10-CM | POA: Diagnosis not present

## 2023-11-29 DIAGNOSIS — Z23 Encounter for immunization: Secondary | ICD-10-CM | POA: Diagnosis not present

## 2023-11-29 DIAGNOSIS — N2581 Secondary hyperparathyroidism of renal origin: Secondary | ICD-10-CM | POA: Diagnosis not present

## 2023-11-29 DIAGNOSIS — D509 Iron deficiency anemia, unspecified: Secondary | ICD-10-CM | POA: Diagnosis not present

## 2023-11-29 DIAGNOSIS — N186 End stage renal disease: Secondary | ICD-10-CM | POA: Diagnosis not present

## 2023-11-30 ENCOUNTER — Other Ambulatory Visit: Payer: Self-pay | Admitting: Family Medicine

## 2023-11-30 DIAGNOSIS — N6320 Unspecified lump in the left breast, unspecified quadrant: Secondary | ICD-10-CM

## 2023-11-30 DIAGNOSIS — N644 Mastodynia: Secondary | ICD-10-CM

## 2023-12-01 DIAGNOSIS — Z23 Encounter for immunization: Secondary | ICD-10-CM | POA: Diagnosis not present

## 2023-12-01 DIAGNOSIS — H903 Sensorineural hearing loss, bilateral: Secondary | ICD-10-CM | POA: Diagnosis not present

## 2023-12-01 DIAGNOSIS — N2581 Secondary hyperparathyroidism of renal origin: Secondary | ICD-10-CM | POA: Diagnosis not present

## 2023-12-01 DIAGNOSIS — Z992 Dependence on renal dialysis: Secondary | ICD-10-CM | POA: Diagnosis not present

## 2023-12-01 DIAGNOSIS — D631 Anemia in chronic kidney disease: Secondary | ICD-10-CM | POA: Diagnosis not present

## 2023-12-01 DIAGNOSIS — D509 Iron deficiency anemia, unspecified: Secondary | ICD-10-CM | POA: Diagnosis not present

## 2023-12-01 DIAGNOSIS — N186 End stage renal disease: Secondary | ICD-10-CM | POA: Diagnosis not present

## 2023-12-02 DIAGNOSIS — Z992 Dependence on renal dialysis: Secondary | ICD-10-CM | POA: Diagnosis not present

## 2023-12-02 DIAGNOSIS — N186 End stage renal disease: Secondary | ICD-10-CM | POA: Diagnosis not present

## 2023-12-02 DIAGNOSIS — I871 Compression of vein: Secondary | ICD-10-CM | POA: Diagnosis not present

## 2023-12-02 DIAGNOSIS — T82838A Hemorrhage of vascular prosthetic devices, implants and grafts, initial encounter: Secondary | ICD-10-CM | POA: Diagnosis not present

## 2023-12-02 DIAGNOSIS — I132 Hypertensive heart and chronic kidney disease with heart failure and with stage 5 chronic kidney disease, or end stage renal disease: Secondary | ICD-10-CM | POA: Diagnosis not present

## 2023-12-02 DIAGNOSIS — M1030 Gout due to renal impairment, unspecified site: Secondary | ICD-10-CM | POA: Diagnosis not present

## 2023-12-02 DIAGNOSIS — J449 Chronic obstructive pulmonary disease, unspecified: Secondary | ICD-10-CM | POA: Diagnosis not present

## 2023-12-02 DIAGNOSIS — I509 Heart failure, unspecified: Secondary | ICD-10-CM | POA: Diagnosis not present

## 2023-12-02 DIAGNOSIS — E1122 Type 2 diabetes mellitus with diabetic chronic kidney disease: Secondary | ICD-10-CM | POA: Diagnosis not present

## 2023-12-02 DIAGNOSIS — T82898A Other specified complication of vascular prosthetic devices, implants and grafts, initial encounter: Secondary | ICD-10-CM | POA: Diagnosis not present

## 2023-12-02 DIAGNOSIS — D631 Anemia in chronic kidney disease: Secondary | ICD-10-CM | POA: Diagnosis not present

## 2023-12-02 DIAGNOSIS — T82856A Stenosis of peripheral vascular stent, initial encounter: Secondary | ICD-10-CM | POA: Diagnosis not present

## 2023-12-03 DIAGNOSIS — Z23 Encounter for immunization: Secondary | ICD-10-CM | POA: Diagnosis not present

## 2023-12-03 DIAGNOSIS — N186 End stage renal disease: Secondary | ICD-10-CM | POA: Diagnosis not present

## 2023-12-03 DIAGNOSIS — Z992 Dependence on renal dialysis: Secondary | ICD-10-CM | POA: Diagnosis not present

## 2023-12-03 DIAGNOSIS — D631 Anemia in chronic kidney disease: Secondary | ICD-10-CM | POA: Diagnosis not present

## 2023-12-03 DIAGNOSIS — N2581 Secondary hyperparathyroidism of renal origin: Secondary | ICD-10-CM | POA: Diagnosis not present

## 2023-12-03 DIAGNOSIS — D509 Iron deficiency anemia, unspecified: Secondary | ICD-10-CM | POA: Diagnosis not present

## 2023-12-08 DIAGNOSIS — Z992 Dependence on renal dialysis: Secondary | ICD-10-CM | POA: Diagnosis not present

## 2023-12-08 DIAGNOSIS — D631 Anemia in chronic kidney disease: Secondary | ICD-10-CM | POA: Diagnosis not present

## 2023-12-08 DIAGNOSIS — D509 Iron deficiency anemia, unspecified: Secondary | ICD-10-CM | POA: Diagnosis not present

## 2023-12-08 DIAGNOSIS — N186 End stage renal disease: Secondary | ICD-10-CM | POA: Diagnosis not present

## 2023-12-08 DIAGNOSIS — Z23 Encounter for immunization: Secondary | ICD-10-CM | POA: Diagnosis not present

## 2023-12-08 DIAGNOSIS — N2581 Secondary hyperparathyroidism of renal origin: Secondary | ICD-10-CM | POA: Diagnosis not present

## 2023-12-09 DIAGNOSIS — N186 End stage renal disease: Secondary | ICD-10-CM | POA: Diagnosis not present

## 2023-12-09 DIAGNOSIS — Z992 Dependence on renal dialysis: Secondary | ICD-10-CM | POA: Diagnosis not present

## 2023-12-10 DIAGNOSIS — Z992 Dependence on renal dialysis: Secondary | ICD-10-CM | POA: Diagnosis not present

## 2023-12-10 DIAGNOSIS — N250 Renal osteodystrophy: Secondary | ICD-10-CM | POA: Diagnosis not present

## 2023-12-10 DIAGNOSIS — N186 End stage renal disease: Secondary | ICD-10-CM | POA: Diagnosis not present

## 2023-12-10 DIAGNOSIS — N2581 Secondary hyperparathyroidism of renal origin: Secondary | ICD-10-CM | POA: Diagnosis not present

## 2023-12-10 DIAGNOSIS — D509 Iron deficiency anemia, unspecified: Secondary | ICD-10-CM | POA: Diagnosis not present

## 2023-12-12 ENCOUNTER — Ambulatory Visit: Admitting: Student in an Organized Health Care Education/Training Program

## 2023-12-12 ENCOUNTER — Ambulatory Visit
Admission: RE | Admit: 2023-12-12 | Discharge: 2023-12-12 | Disposition: A | Discharge status: Home / Self Care | Source: Ambulatory Visit | Attending: Student in an Organized Health Care Education/Training Program | Admitting: Student in an Organized Health Care Education/Training Program

## 2023-12-12 VITALS — BP 151/67 | HR 72 | Temp 97.3°F | Resp 16 | Ht 66.0 in | Wt 200.0 lb

## 2023-12-12 DIAGNOSIS — M1712 Unilateral primary osteoarthritis, left knee: Secondary | ICD-10-CM | POA: Diagnosis present

## 2023-12-12 DIAGNOSIS — M170 Bilateral primary osteoarthritis of knee: Secondary | ICD-10-CM

## 2023-12-12 DIAGNOSIS — M1711 Unilateral primary osteoarthritis, right knee: Secondary | ICD-10-CM | POA: Diagnosis present

## 2023-12-12 DIAGNOSIS — E1140 Type 2 diabetes mellitus with diabetic neuropathy, unspecified: Secondary | ICD-10-CM | POA: Diagnosis not present

## 2023-12-12 MED ORDER — ROPIVACAINE HCL 2 MG/ML IJ SOLN
18.0000 mL | Freq: Once | INTRAMUSCULAR | Status: AC
  Administered 2023-12-12: 18 mL via PERINEURAL
  Filled 2023-12-12: qty 20

## 2023-12-12 MED ORDER — DIAZEPAM 5 MG PO TABS
ORAL_TABLET | ORAL | Status: AC
  Filled 2023-12-12: qty 1

## 2023-12-12 MED ORDER — DEXAMETHASONE SOD PHOSPHATE PF 10 MG/ML IJ SOLN
20.0000 mg | Freq: Once | INTRAMUSCULAR | Status: AC
  Administered 2023-12-12: 20 mg

## 2023-12-12 MED ORDER — CAPSAICIN-CLEANSING GEL 8 % EX KIT
4.0000 | PACK | Freq: Once | CUTANEOUS | Status: AC
  Administered 2023-12-12: 4 via TOPICAL

## 2023-12-12 MED ORDER — DIAZEPAM 5 MG PO TABS
5.0000 mg | ORAL_TABLET | ORAL | Status: AC
  Administered 2023-12-12: 5 mg via ORAL

## 2023-12-12 MED ORDER — LIDOCAINE HCL 2 % IJ SOLN
20.0000 mL | Freq: Once | INTRAMUSCULAR | Status: AC
  Administered 2023-12-12: 400 mg
  Filled 2023-12-12: qty 20

## 2023-12-12 NOTE — Progress Notes (Signed)
 PROVIDER NOTE: Interpretation of information contained herein should be left to medically-trained personnel. Specific patient instructions are provided elsewhere under Patient Instructions section of medical record. This document was created in part using STT-dictation technology, any transcriptional errors that may result from this process are unintentional.  Patient: Jocelyn Wells Harvest Type: Established DOB: 07-03-1955 MRN: [id number] PCP: Donzella Lauraine SAILOR, DO  Service: Procedure DOS: 12/12/2023 Setting: Ambulatory Location: Ambulatory outpatient facility Delivery: Face-to-face Provider: Wallie Sherry, MD Specialty: Interventional Pain Management Specialty designation: 09 Location: Outpatient facility Ref. Prov.: Pardue, Lauraine SAILOR, DO       Interventional Therapy   Interventional Treatment:           Type: Qutenza  Neurolysis #4  Laterality:  Bilateral Area treated: Feet Imaging Guidance: None Anesthesia/analgesia/anxiolysis/sedation: None required Medication (Right): Qutenza  (capsaicin  8%) topical system Medication (Left): Qutenza  (capsaicin  8%) topical system Date: 12/12/2023 Performed by: Wallie Sherry, MD Rationale (medical necessity): procedure needed and proper for the treatment of Ms. Cardell's medical symptoms and needs. Indication: Painful diabetic peripheral neuralgia (DPN) (ICD-10-CM:E11.40) severe enough to impact quality of life or function. 1. Chronic painful diabetic neuropathy (HCC)   2. Primary osteoarthritis of right knee   3. Primary osteoarthritis of left knee     NAS-11 Pain score:   Pre-procedure: 1 /10   Post-procedure: 0-No pain/10     Position / Prep / Materials:  Position: Supine  Materials: Qutenza  Kit  Pre-op  H&P Assessment:  [name] is a 68 y.o. (year old), female patient, seen today for interventional treatment. She  has a past surgical history that includes Abdominal hysterectomy; Tubal ligation (02/09/1976); Cesarean section (02/09/1976); Knee  arthroscopy (Left, 02/09/2003); Ulnar nerve transposition (02/08/2006); Bilateral salpingoophorectomy (02/08/1998); Ankle fracture surgery (Left, 02/09/2000); TEE without cardioversion (N/A, 01/31/2015); Cardiac catheterization (N/A, 03/25/2015); Cardiac catheterization (N/A, 08/05/2015); Esophagogastroduodenoscopy (egd) with propofol  (N/A, 09/22/2016); Colonoscopy with propofol  (N/A, 09/22/2016); Esophagogastroduodenoscopy (egd) with propofol  (N/A, 12/08/2016); Lower Extremity Angiography (Left, 08/12/2021); TEMPORARY DIALYSIS CATHETER (N/A, 08/14/2021); Fracture surgery; Colonoscopy with propofol  (N/A, 02/15/2022); Esophagogastroduodenoscopy (N/A, 02/15/2022); Givens capsule study (N/A, 04/19/2022); AV fistula placement (Left, 06/18/2022); Insertion hybrid anteriovenous gortex graft (06/18/2022); Esophagogastroduodenoscopy (egd) with propofol  (N/A, 10/12/2022); Givens capsule study (N/A, 10/12/2022); Facial laceration repair (N/A, 12/27/2022); PERIPHERAL VASCULAR THROMBECTOMY (Left, 01/19/2023); and DIALYSIS/PERMA CATHETER INSERTION (N/A, 01/19/2023). Ms. Hutzler has a current medication list which includes the following prescription(s): albuterol , atorvastatin , bupropion , calcium  carbonate, eliquis , fluticasone , gabapentin , hydroxyurea , insulin  aspart, omnipod dash pods (gen 4), insulin  pump, loratadine , meclizine , oxygen -helium, paroxetine , optichamber diamond , torsemide , and tramadol . Her primarily concern today is the Foot Pain (bilateral)  Initial Vital Signs:  Pulse/HCG Rate: 72ECG Heart Rate: 70 Temp: (!) 97.3 F (36.3 C) Resp: 18 BP: (!) 153/63 SpO2: 96 %  BMI: Estimated body mass index is 32.28 kg/m as calculated from the following:   Height as of this encounter: 5' 6 (1.676 m).   Weight as of this encounter: 200 lb (90.7 kg).  Risk Assessment: Allergies: Reviewed. She is allergic to ivp dye [iodinated contrast media] and codeine.  Allergy Precautions: None required Coagulopathies: Reviewed. None  identified.  Blood-thinner therapy: None at this time Active Infection(s): Reviewed. None identified. Ms. Javid is afebrile  Site Confirmation: Ms. Adan was asked to confirm the procedure and laterality before marking the site Procedure checklist: Completed Consent: Before the procedure and under the influence of no sedative(s), amnesic(s), or anxiolytics, the patient was informed of the treatment options, risks and possible complications. To fulfill our ethical and legal obligations, as recommended by the American Medical  Association's Code of Ethics, I have informed the patient of my clinical impression; the nature and purpose of the treatment or procedure; the risks, benefits, and possible complications of the intervention; the alternatives, including doing nothing; the risk(s) and benefit(s) of the alternative treatment(s) or procedure(s); and the risk(s) and benefit(s) of doing nothing. The patient was provided information about the general risks and possible complications associated with the procedure. These may include, but are not limited to: failure to achieve desired goals, infection, bleeding, organ or nerve damage, allergic reactions, paralysis, and death. In addition, the patient was informed of those risks and complications associated to the procedure, such as failure to decrease pain; infection; bleeding; organ or nerve damage with subsequent damage to sensory, motor, and/or autonomic systems, resulting in permanent pain, numbness, and/or weakness of one or several areas of the body; allergic reactions; (i.e.: anaphylactic reaction); and/or death. Furthermore, the patient was informed of those risks and complications associated with the medications. These include, but are not limited to: allergic reactions (i.e.: anaphylactic or anaphylactoid reaction(s)); adrenal axis suppression; blood sugar elevation that in diabetics may result in ketoacidosis or comma; water retention that in  patients with history of congestive heart failure may result in shortness of breath, pulmonary edema, and decompensation with resultant heart failure; weight gain; swelling or edema; medication-induced neural toxicity; particulate matter embolism and blood vessel occlusion with resultant organ, and/or nervous system infarction; and/or aseptic necrosis of one or more joints. Finally, the patient was informed that Medicine is not an exact science; therefore, there is also the possibility of unforeseen or unpredictable risks and/or possible complications that may result in a catastrophic outcome. The patient indicated having understood very clearly. We have given the patient no guarantees and we have made no promises. Enough time was given to the patient to ask questions, all of which were answered to the patient's satisfaction. Ms. Alberts has indicated that she wanted to continue with the procedure. Attestation: I, the ordering provider, attest that I have discussed with the patient the benefits, risks, side-effects, alternatives, likelihood of achieving goals, and potential problems during recovery for the procedure that I have provided informed consent. Date  Time: 12/12/2023  9:54 AM  Pre-Procedure Preparation:  Monitoring: As per clinic protocol. Respiration, ETCO2, SpO2, BP, heart rate and rhythm monitor placed and checked for adequate function Safety Precautions: Patient was assessed for positional comfort and pressure points before starting the procedure. Time-out: I initiated and conducted the Time-out before starting the procedure, as per protocol. The patient was asked to participate by confirming the accuracy of the Time Out information. Verification of the correct person, site, and procedure were performed and confirmed by me, the nursing staff, and the patient. Time-out conducted as per Joint Commission's Universal Protocol ([email or web address].01.01.01). Time: 1140 Start Time: 1140  hrs.  Description/Narrative of Procedure:          Region: Distal lower extremity Target Area: Sensory peripheral nerves affected by diabetic peripheral neuropathy Site: Feet Approach: Percutaneous  No./Series: Not applicable  Type: Percutaneous  Purpose: Therapeutic  Region: Distal lower extremities  Start Time: 1140 hrs.  Description of the Procedure: Protocol guidelines were followed. The patient was assisted into a comfortable position.  Informed consent was obtained in the patient monitored in the usual manner.  All questions were answered prior to the procedure.  They Qutenza  patches were applied to the affected area and then covered with the wrap.  The Patient was kept under observation until the treatment was completed.  The patches were removed and the treated area was inspected.  Vitals:   12/12/23 1142 12/12/23 1147 12/12/23 1152 12/12/23 1156  BP: (!) 157/74 (!) 157/74 (!) 158/58 (!) 151/67  Pulse:      Resp: (!) 24 19 16 16   Temp:      SpO2: 98% 99% 98% 96%  Weight:      Height:          End Time: 1153 hrs.  Imaging Guidance:          Type of Imaging Technique: None used Indication(s): N/A Exposure Time: No patient exposure Contrast: None used. Fluoroscopic Guidance: N/A Ultrasound Guidance: N/A Interpretation: N/A  Post-operative Assessment:  Post-procedure Vital Signs:  Pulse/HCG Rate: 7269 Temp: (!) 97.3 F (36.3 C) Resp: 16 BP: (!) 151/67 SpO2: 96 %  EBL: None  Complications: No immediate post-treatment complications observed by team, or reported by patient.  Note: The patient tolerated the entire procedure well. A repeat set of vitals were taken after the procedure and the patient was kept under observation following institutional policy, for this type of procedure. Post-procedural neurological assessment was performed, showing return to baseline, prior to discharge. The patient was provided with post-procedure discharge instructions, including a  section on how to identify potential problems. Should any problems arise concerning this procedure, the patient was given instructions to immediately contact us , at any time, without hesitation. In any case, we plan to contact the patient by telephone for a follow-up status report regarding this interventional procedure.  Comments:  No additional relevant information.  Plan of Care (POC)  Orders:  Orders Placed This Encounter  Procedures   NEUROLYSIS    Please order Qutenza  patches    Standing Status:   Future    Expected Date:   03/13/2024    Expiration Date:   12/11/2024    Where will this procedure be performed?:   [facility]   GENICULAR NERVE BLOCK    Indication(s):  Sub-acute knee pain    Standing Status:   Future    Expected Date:   12/12/2023    Expiration Date:   12/11/2024    Scheduling Instructions:     Side: Bilateral     Sedation: Patient's choice.     Timeframe: As soon as schedule allows    Where will this procedure be performed?:   [facility]   DG PAIN [facility] C-ARM 1-60 MIN NO REPORT    Intraoperative interpretation by procedural physician at [facility].    Standing Status:   Standing    Number of Occurrences:   1    Reason for exam::   Assistance in needle guidance and placement for procedures requiring needle placement in or near specific anatomical locations not easily accessible without such assistance.   Chronic Opioid Analgesic:  Tramadol  50 mg twice daily as needed, quantity 60/month    Medications ordered for procedure: Meds ordered this encounter  Medications   capsaicin  topical system 8 % patch 4 patch   lidocaine  (XYLOCAINE ) 2 % (with pres) injection 400 mg   ropivacaine  (PF) 2 mg/mL (0.2%) (NAROPIN ) injection 18 mL   dexamethasone  (DECADRON ) injection 20 mg   diazepam  (VALIUM ) tablet 5 mg    Make sure Flumazenil is available in the pyxis when using this medication. If oversedation occurs, administer 0.2 mg IV over 15 sec.  If after 45 sec no response, administer 0.2 mg again over 1 min; may repeat at 1 min intervals; not to exceed  4 doses (1 mg)   Medications administered: We administered capsaicin  topical system, lidocaine , ropivacaine  (PF) 2 mg/mL (0.2%), dexamethasone , and diazepam .  See the medical record for exact dosing, route, and time of administration.  Follow-up plan:   Return in about 3 months (around 03/13/2024) for Qutenza .       Recent Visits Date Type Provider Dept  11/21/23 Office Visit Patel, Seema K, NP [facility]  09/28/23 Procedure visit Marcelino Nurse, MD [facility]  Showing recent visits within past 90 days and meeting all other requirements Today's Visits Date Type Provider Dept  12/12/23 Procedure visit Marcelino Nurse, MD [facility]  Showing today's visits and meeting all other requirements Future Appointments Date Type Provider Dept  02/15/24 Appointment Patel, Seema K, NP [facility]  Showing future appointments within next 90 days and meeting all other requirements  Disposition: Discharge home  Discharge (Date  Time): 12/12/2023;   hrs.   Primary Care Physician: Donzella Lauraine SAILOR, DO Location: [facility] Note by: Nurse Marcelino, MD (TTS technology used. I apologize for any typographical errors that were not detected and corrected.) Date: 12/12/2023; Time: 12:00 PM  Disclaimer:  Medicine is not an visual merchandiser. The only guarantee in medicine is that nothing is guaranteed. It is important to note that the decision to proceed with this intervention was based on the information collected from the patient. The Data and conclusions were drawn from the patient's questionnaire, the interview, and the physical examination. Because the information was provided in large part by the patient, it cannot be guaranteed that it has not been purposely or unconsciously manipulated. Every effort has been made to obtain as much  relevant data as possible for this evaluation. It is important to note that the conclusions that lead to this procedure are derived in large part from the available data. Always take into account that the treatment will also be dependent on availability of resources and existing treatment guidelines, considered by other Pain Management Practitioners as being common knowledge and practice, at the time of the intervention. For Medico-Legal purposes, it is also important to point out that variation in procedural techniques and pharmacological choices are the acceptable norm. The indications, contraindications, technique, and results of the above procedure should only be interpreted and judged by a Board-Certified Interventional Pain Specialist with extensive familiarity and expertise in the same exact procedure and technique.

## 2023-12-12 NOTE — Progress Notes (Signed)
 PROVIDER NOTE: Interpretation of information contained herein should be left to medically-trained personnel. Specific patient instructions are provided elsewhere under Patient Instructions section of medical record. This document was created in part using STT-dictation technology, any transcriptional errors that may result from this process are unintentional.  Patient: Jocelyn Wells Type: Established DOB: 19-Jul-1955 MRN: [id number] PCP: Donzella Jocelyn Wells  Service: Procedure DOS: 12/12/2023 Setting: Ambulatory Location: Ambulatory outpatient facility Delivery: Face-to-face Provider: Wallie Sherry, Wells Specialty: Interventional Pain Management Specialty designation: 09 Location: Outpatient facility Ref. Prov.: Jocelyn Wells       Interventional Therapy   Procedure:               Type: Genicular Nerves Block (Superolateral, Superomedial, and Inferomedial Genicular Nerves)   Laterality: Bilateral (-50)  Level: Superior and inferior to the knee joint.  Imaging: Fluoroscopic guidance Anesthesia: Local anesthesia (1-2% Lidocaine ) Sedation: PO Valium  DOS: 12/12/2023  Performed by: Jocelyn Wells  Purpose: Diagnostic/Therapeutic Indications: Chronic knee pain severe enough to impact quality of life or function. Rationale (medical necessity): procedure needed and proper for the diagnosis and/or treatment of Jocelyn Wells's medical symptoms and needs. Knee OA  NAS-11 Pain score:   Pre-procedure: 1 /10   Post-procedure: 0-No pain/10     Target: For Genicular Nerve block(s), the targets are: the superolateral genicular nerve, located in the lateral distal portion of the femoral shaft as it curves to form the lateral epicondyle, in the region of the distal femoral metaphysis; the superomedial genicular nerve, located in the medial distal portion of the femoral shaft as it curves to form the medial epicondyle; and the inferomedial genicular nerve, located in the medial, proximal portion  of the tibial shaft, as it curves to form the medial epicondyle, in the region of the proximal tibial metaphysis.  Location: Superolateral, Superomedial, and Inferomedial aspects of knee joint.  Region: Lateral, Anterior, and Medial aspects of the knee joint, above and below the knee joint proper. Approach: Percutaneous  Type of procedure: Percutaneous perineural nerve block. The genicular nerve block is a motor-sparing technique that anesthetizes the sensory terminal branches innervating the knee joint, resulting in anesthesia of the anterior compartment of the knee. The distribution of anesthesia of each nerve is mostly in the corresponding quadrant.  Neuroanatomy: The superolateral genicular nerve (SLGN) courses around the femur shaft to pass between the vastus lateralis and the lateral epicondyle. It accompanies the superior lateral genicular artery. The superomedial genicular nerve (SMGN) courses around the femur shaft, following the superior medial genicular artery, to pass between the adductor magnus tendon and the medial epicondyle below the vastus medialis. The inferolateral genicular nerve (ILGN) courses around the tibial lateral epicondyle deep to the lateral collateral ligament, following the inferior lateral genicular artery, superior of the fibula head. The inferomedial genicular nerve (IMGN) courses horizontally below the medial collateral ligament between the tibial medial epicondyle and the insertion of the collateral ligament. It accompanies the inferior medial genicular artery. The recurrent peroneal nerve originates in the inferior popliteal region from the common peroneal nerve and courses horizontally around the fibula to pass just inferior of the fibula head and travel superior to the anterolateral tibial epicondyle. It accompanies the recurrent tibial artery.  Position / Prep / Materials:  Position: Supine, Modified Fowler's position with pillows under the targeted knee(s). The  patient is placed in a supine position with the knee slightly flexed by placing a pillow in the popliteal fossa. Prep solution: ChloraPrep (2% chlorhexidine  gluconate and 70%  isopropyl alcohol) Prep Area: Entire knee area, from mid-thigh to mid-shin, lateral, anterior, and medial aspects. Materials:  Tray: Block Needle(s):  Type: Spinal  Gauge (G): 22  Length: 3.5-in  Qty: 3  H&P (Pre-op  Assessment):  Jocelyn Wells is a 68 y.o. (year old), female patient, seen today for interventional treatment. She  has a past surgical history that includes Abdominal hysterectomy; Tubal ligation (02/09/1976); Cesarean section (02/09/1976); Knee arthroscopy (Left, 02/09/2003); Ulnar nerve transposition (02/08/2006); Bilateral salpingoophorectomy (02/08/1998); Ankle fracture surgery (Left, 02/09/2000); TEE without cardioversion (N/A, 01/31/2015); Cardiac catheterization (N/A, 03/25/2015); Cardiac catheterization (N/A, 08/05/2015); Esophagogastroduodenoscopy (egd) with propofol  (N/A, 09/22/2016); Colonoscopy with propofol  (N/A, 09/22/2016); Esophagogastroduodenoscopy (egd) with propofol  (N/A, 12/08/2016); Lower Extremity Angiography (Left, 08/12/2021); TEMPORARY DIALYSIS CATHETER (N/A, 08/14/2021); Fracture surgery; Colonoscopy with propofol  (N/A, 02/15/2022); Esophagogastroduodenoscopy (N/A, 02/15/2022); Givens capsule study (N/A, 04/19/2022); AV fistula placement (Left, 06/18/2022); Insertion hybrid anteriovenous gortex graft (06/18/2022); Esophagogastroduodenoscopy (egd) with propofol  (N/A, 10/12/2022); Givens capsule study (N/A, 10/12/2022); Facial laceration repair (N/A, 12/27/2022); PERIPHERAL VASCULAR THROMBECTOMY (Left, 01/19/2023); and DIALYSIS/PERMA CATHETER INSERTION (N/A, 01/19/2023). Jocelyn Wells has a current medication list which includes the following prescription(s): albuterol , atorvastatin , bupropion , calcium  carbonate, eliquis , fluticasone , gabapentin , hydroxyurea , insulin  aspart, omnipod dash pods (gen 4), insulin   pump, loratadine , meclizine , oxygen -helium, paroxetine , optichamber diamond , torsemide , and tramadol . Her primarily concern today is the Foot Pain (bilateral)  Initial Vital Signs:  Pulse/HCG Rate: 72ECG Heart Rate: 70 Temp: (!) 97.3 F (36.3 C) Resp: 18 BP: (!) 153/63 SpO2: 96 %  BMI: Estimated body mass index is 32.28 kg/m as calculated from the following:   Height as of this encounter: 5' 6 (1.676 m).   Weight as of this encounter: 200 lb (90.7 kg).  Risk Assessment: Allergies: Reviewed. She is allergic to ivp dye [iodinated contrast media] and codeine.  Allergy Precautions: None required Coagulopathies: Reviewed. None identified.  Blood-thinner therapy: None at this time Active Infection(s): Reviewed. None identified. Ms. Vogl is afebrile  Site Confirmation: Ms. Meske was asked to confirm the procedure and laterality before marking the site Procedure checklist: Completed Consent: Before the procedure and under the influence of no sedative(s), amnesic(s), or anxiolytics, the patient was informed of the treatment options, risks and possible complications. To fulfill our ethical and legal obligations, as recommended by the American Medical Association's Code of Ethics, I have informed the patient of my clinical impression; the nature and purpose of the treatment or procedure; the risks, benefits, and possible complications of the intervention; the alternatives, including doing nothing; the risk(s) and benefit(s) of the alternative treatment(s) or procedure(s); and the risk(s) and benefit(s) of doing nothing. The patient was provided information about the general risks and possible complications associated with the procedure. These may include, but are not limited to: failure to achieve desired goals, infection, bleeding, organ or nerve damage, allergic reactions, paralysis, and death. In addition, the patient was informed of those risks and complications associated to the procedure,  such as failure to decrease pain; infection; bleeding; organ or nerve damage with subsequent damage to sensory, motor, and/or autonomic systems, resulting in permanent pain, numbness, and/or weakness of one or several areas of the body; allergic reactions; (i.e.: anaphylactic reaction); and/or death. Furthermore, the patient was informed of those risks and complications associated with the medications. These include, but are not limited to: allergic reactions (i.e.: anaphylactic or anaphylactoid reaction(s)); adrenal axis suppression; blood sugar elevation that in diabetics may result in ketoacidosis or comma; water retention that in patients with history of congestive heart failure may result in  shortness of breath, pulmonary edema, and decompensation with resultant heart failure; weight gain; swelling or edema; medication-induced neural toxicity; particulate matter embolism and blood vessel occlusion with resultant organ, and/or nervous system infarction; and/or aseptic necrosis of one or more joints. Finally, the patient was informed that Medicine is not an exact science; therefore, there is also the possibility of unforeseen or unpredictable risks and/or possible complications that may result in a catastrophic outcome. The patient indicated having understood very clearly. We have given the patient no guarantees and we have made no promises. Enough time was given to the patient to ask questions, all of which were answered to the patient's satisfaction. Ms. Gregson has indicated that she wanted to continue with the procedure. Attestation: I, the ordering provider, attest that I have discussed with the patient the benefits, risks, side-effects, alternatives, likelihood of achieving goals, and potential problems during recovery for the procedure that I have provided informed consent. Date  Time: 12/12/2023  9:54 AM  Pre-Procedure Preparation:  Monitoring: As per clinic protocol. Respiration, ETCO2, SpO2, BP,  heart rate and rhythm monitor placed and checked for adequate function Safety Precautions: Patient was assessed for positional comfort and pressure points before starting the procedure. Time-out: I initiated and conducted the Time-out before starting the procedure, as per protocol. The patient was asked to participate by confirming the accuracy of the Time Out information. Verification of the correct person, site, and procedure were performed and confirmed by me, the nursing staff, and the patient. Time-out conducted as per Joint Commission's Universal Protocol (UP.01.01.01). Time: 1140 Start Time: 1140 hrs.  Description/Narrative of Procedure:          Rationale (medical necessity): procedure needed and proper for the diagnosis and/or treatment of the patient's medical symptoms and needs. Procedural Technique Safety Precautions: Aspiration looking for blood return was conducted prior to all injections. At no point did we inject any substances, as a needle was being advanced. No attempts were made at seeking any paresthesias. Safe injection practices and needle disposal techniques used. Medications properly checked for expiration dates. SDV (single dose vial) medications used. Description of the Procedure: Protocol guidelines were followed. The patient was assisted into a comfortable position. The target area was identified and the area prepped in the usual manner. Skin & deeper tissues infiltrated with local anesthetic. Appropriate amount of time allowed to pass for local anesthetics to take effect. The procedure needles were then advanced to the target area. Proper needle placement secured. Negative aspiration confirmed. Solution injected in intermittent fashion, asking for systemic symptoms every 0.5cc of injectate. The needles were then removed and the area cleansed, making sure to leave some of the prepping solution back to take advantage of its long term bactericidal properties.  3.3 cc of  nerve block solution injected for each of the genicular nerves. (10 cc solution made of 9 cc of 0.2% ropivacaine , 1 cc of Decadron  10 mg/cc) 3.3 cc of nerve block solution injected for each of the genicular nerves. (10 cc solution made of 9 cc of 0.2% ropivacaine , 1 cc of Decadron  10 mg/cc)             Vitals:   12/12/23 1142 12/12/23 1147 12/12/23 1152 12/12/23 1156  BP: (!) 157/74 (!) 157/74 (!) 158/58 (!) 151/67  Pulse:      Resp: (!) 24 19 16 16   Temp:      SpO2: 98% 99% 98% 96%  Weight:      Height:  Start Time: 1140 hrs. End Time: 1153 hrs.  Imaging Guidance (Non-Spinal):          Type of Imaging Technique: Fluoroscopy Guidance (Non-Spinal) Indication(s): Fluoroscopy guidance for needle placement to enhance accuracy in procedures requiring precise needle localization for targeted delivery of medication in or near specific anatomical locations not easily accessible without such real-time imaging assistance. Exposure Time: Please see nurses notes. Contrast: None used. Fluoroscopic Guidance: I was personally present during the use of fluoroscopy. Tunnel Vision Technique used to obtain the best possible view of the target area. Parallax error corrected before commencing the procedure. Direction-depth-direction technique used to introduce the needle under continuous pulsed fluoroscopy. Once target was reached, antero-posterior, oblique, and lateral fluoroscopic projection used confirm needle placement in all planes. Images permanently stored in EMR. Interpretation: No contrast injected. I personally interpreted the imaging intraoperatively. Adequate needle placement confirmed in multiple planes. Permanent images saved into the patient's record.  Post-operative Assessment:  Post-procedure Vital Signs:  Pulse/HCG Rate: 7269 Temp: (!) 97.3 F (36.3 C) Resp: 16 BP: (!) 151/67 SpO2: 96 %  EBL: None  Complications: No immediate post-treatment complications observed by  team, or reported by patient.  Note: The patient tolerated the entire procedure well. A repeat set of vitals were taken after the procedure and the patient was kept under observation following institutional policy, for this type of procedure. Post-procedural neurological assessment was performed, showing return to baseline, prior to discharge. The patient was provided with post-procedure discharge instructions, including a section on how to identify potential problems. Should any problems arise concerning this procedure, the patient was given instructions to immediately contact us , at any time, without hesitation. In any case, we plan to contact the patient by telephone for a follow-up status report regarding this interventional procedure.  Comments:  No additional relevant information.  Plan of Care (POC)  Orders:  Orders Placed This Encounter  Procedures   NEUROLYSIS    Please order Qutenza  patches    Standing Status:   Future    Expected Date:   03/13/2024    Expiration Date:   12/11/2024    Where will this procedure be performed?:   [facility]   GENICULAR NERVE BLOCK    Indication(s):  Sub-acute knee pain    Standing Status:   Future    Expected Date:   12/12/2023    Expiration Date:   12/11/2024    Scheduling Instructions:     Side: Bilateral     Sedation: Patient's choice.     Timeframe: As soon as schedule allows    Where will this procedure be performed?:   [facility]   DG PAIN [facility] C-ARM 1-60 MIN NO REPORT    Intraoperative interpretation by procedural physician at [facility].    Standing Status:   Standing    Number of Occurrences:   1    Reason for exam::   Assistance in needle guidance and placement for procedures requiring needle placement in or near specific anatomical locations not easily accessible without such assistance.     Medications ordered for procedure: Meds ordered this encounter  Medications   capsaicin  topical system 8 %  patch 4 patch   lidocaine  (XYLOCAINE ) 2 % (with pres) injection 400 mg   ropivacaine  (PF) 2 mg/mL (0.2%) (NAROPIN ) injection 18 mL   dexamethasone  (DECADRON ) injection 20 mg   diazepam  (VALIUM ) tablet 5 mg    Make sure Flumazenil is available in the pyxis when using this medication.  If oversedation occurs, administer 0.2 mg IV over 15 sec. If after 45 sec no response, administer 0.2 mg again over 1 min; may repeat at 1 min intervals; not to exceed 4 doses (1 mg)   Medications administered: We administered capsaicin  topical system, lidocaine , ropivacaine  (PF) 2 mg/mL (0.2%), dexamethasone , and diazepam .  See the medical record for exact dosing, route, and time of administration.  Follow-up plan:   Return in about 3 months (around 03/13/2024) for Qutenza .       Recent Visits Date Type Provider Dept  11/21/23 Office Visit Patel, Seema K, NP [facility]  09/28/23 Procedure visit Marcelino Nurse, Wells [facility]  Showing recent visits within past 90 days and meeting all other requirements Today's Visits Date Type Provider Dept  12/12/23 Procedure visit Marcelino Nurse, Wells [facility]  Showing today's visits and meeting all other requirements Future Appointments Date Type Provider Dept  02/15/24 Appointment Patel, Seema K, NP [facility]  Showing future appointments within next 90 days and meeting all other requirements  Disposition: Discharge home  Discharge (Date  Time): 12/12/2023;   hrs.   Primary Care Physician: Donzella Jocelyn Wells Location: [facility] Note by: Nurse Marcelino, Wells (TTS technology used. I apologize for any typographical errors that were not detected and corrected.) Date: 12/12/2023; Time: 12:01 PM  Disclaimer:  Medicine is not an visual merchandiser. The only guarantee in medicine is that nothing is guaranteed. It is important to note that the decision to proceed with this intervention was based on the  information collected from the patient. The Data and conclusions were drawn from the patient's questionnaire, the interview, and the physical examination. Because the information was provided in large part by the patient, it cannot be guaranteed that it has not been purposely or unconsciously manipulated. Every effort has been made to obtain as much relevant data as possible for this evaluation. It is important to note that the conclusions that lead to this procedure are derived in large part from the available data. Always take into account that the treatment will also be dependent on availability of resources and existing treatment guidelines, considered by other Pain Management Practitioners as being common knowledge and practice, at the time of the intervention. For Medico-Legal purposes, it is also important to point out that variation in procedural techniques and pharmacological choices are the acceptable norm. The indications, contraindications, technique, and results of the above procedure should only be interpreted and judged by a Board-Certified Interventional Pain Specialist with extensive familiarity and expertise in the same exact procedure and technique.

## 2023-12-12 NOTE — Patient Instructions (Signed)

## 2023-12-12 NOTE — Progress Notes (Signed)
 Safety precautions to be maintained throughout the outpatient stay will include: orient to surroundings, keep bed in low position, maintain call bell within reach at all times, provide assistance with transfer out of bed and ambulation.

## 2023-12-13 ENCOUNTER — Telehealth: Payer: Self-pay

## 2023-12-13 DIAGNOSIS — N250 Renal osteodystrophy: Secondary | ICD-10-CM | POA: Diagnosis not present

## 2023-12-13 DIAGNOSIS — D509 Iron deficiency anemia, unspecified: Secondary | ICD-10-CM | POA: Diagnosis not present

## 2023-12-13 DIAGNOSIS — Z992 Dependence on renal dialysis: Secondary | ICD-10-CM | POA: Diagnosis not present

## 2023-12-13 DIAGNOSIS — N2581 Secondary hyperparathyroidism of renal origin: Secondary | ICD-10-CM | POA: Diagnosis not present

## 2023-12-13 DIAGNOSIS — N186 End stage renal disease: Secondary | ICD-10-CM | POA: Diagnosis not present

## 2023-12-13 MED FILL — Capsaicin Patch 8% & Cleansing Gel Kit: CUTANEOUS | Qty: 4 | Status: AC

## 2023-12-13 NOTE — Telephone Encounter (Signed)
 Post procedure follow up.  Patient states she is doing ok and is at dialysis right now.

## 2023-12-14 ENCOUNTER — Ambulatory Visit
Admission: RE | Admit: 2023-12-14 | Discharge: 2023-12-14 | Disposition: A | Discharge status: Home / Self Care | Source: Ambulatory Visit | Attending: Family Medicine | Admitting: Family Medicine

## 2023-12-14 ENCOUNTER — Other Ambulatory Visit: Payer: Self-pay | Admitting: Family Medicine

## 2023-12-14 DIAGNOSIS — N6320 Unspecified lump in the left breast, unspecified quadrant: Secondary | ICD-10-CM | POA: Insufficient documentation

## 2023-12-14 DIAGNOSIS — N644 Mastodynia: Secondary | ICD-10-CM

## 2023-12-14 DIAGNOSIS — N6323 Unspecified lump in the left breast, lower outer quadrant: Secondary | ICD-10-CM | POA: Diagnosis not present

## 2023-12-14 DIAGNOSIS — R928 Other abnormal and inconclusive findings on diagnostic imaging of breast: Secondary | ICD-10-CM

## 2023-12-14 DIAGNOSIS — R92323 Mammographic fibroglandular density, bilateral breasts: Secondary | ICD-10-CM | POA: Diagnosis not present

## 2023-12-16 DIAGNOSIS — Z9889 Other specified postprocedural states: Secondary | ICD-10-CM | POA: Diagnosis not present

## 2023-12-16 DIAGNOSIS — E113513 Type 2 diabetes mellitus with proliferative diabetic retinopathy with macular edema, bilateral: Secondary | ICD-10-CM | POA: Diagnosis not present

## 2023-12-17 DIAGNOSIS — D509 Iron deficiency anemia, unspecified: Secondary | ICD-10-CM | POA: Diagnosis not present

## 2023-12-17 DIAGNOSIS — N186 End stage renal disease: Secondary | ICD-10-CM | POA: Diagnosis not present

## 2023-12-17 DIAGNOSIS — N2581 Secondary hyperparathyroidism of renal origin: Secondary | ICD-10-CM | POA: Diagnosis not present

## 2023-12-17 DIAGNOSIS — Z992 Dependence on renal dialysis: Secondary | ICD-10-CM | POA: Diagnosis not present

## 2023-12-17 DIAGNOSIS — N250 Renal osteodystrophy: Secondary | ICD-10-CM | POA: Diagnosis not present

## 2023-12-19 DIAGNOSIS — Z992 Dependence on renal dialysis: Secondary | ICD-10-CM | POA: Diagnosis not present

## 2023-12-19 DIAGNOSIS — D509 Iron deficiency anemia, unspecified: Secondary | ICD-10-CM | POA: Diagnosis not present

## 2023-12-19 DIAGNOSIS — N250 Renal osteodystrophy: Secondary | ICD-10-CM | POA: Diagnosis not present

## 2023-12-19 DIAGNOSIS — N186 End stage renal disease: Secondary | ICD-10-CM | POA: Diagnosis not present

## 2023-12-19 DIAGNOSIS — N2581 Secondary hyperparathyroidism of renal origin: Secondary | ICD-10-CM | POA: Diagnosis not present

## 2023-12-20 ENCOUNTER — Ambulatory Visit
Admission: RE | Admit: 2023-12-20 | Discharge: 2023-12-20 | Disposition: A | Discharge status: Home / Self Care | Source: Ambulatory Visit | Attending: Family Medicine | Admitting: Family Medicine

## 2023-12-20 ENCOUNTER — Ambulatory Visit
Admission: RE | Admit: 2023-12-20 | Discharge: 2023-12-20 | Disposition: A | Source: Ambulatory Visit | Attending: Family Medicine | Admitting: Family Medicine

## 2023-12-20 DIAGNOSIS — R928 Other abnormal and inconclusive findings on diagnostic imaging of breast: Secondary | ICD-10-CM

## 2023-12-20 DIAGNOSIS — C50312 Malignant neoplasm of lower-inner quadrant of left female breast: Secondary | ICD-10-CM | POA: Diagnosis not present

## 2023-12-20 DIAGNOSIS — C50512 Malignant neoplasm of lower-outer quadrant of left female breast: Secondary | ICD-10-CM | POA: Insufficient documentation

## 2023-12-20 HISTORY — PX: BREAST BIOPSY: SHX20

## 2023-12-20 MED ORDER — LIDOCAINE-EPINEPHRINE 1 %-1:100000 IJ SOLN
5.0000 mL | Freq: Once | INTRAMUSCULAR | Status: AC
  Administered 2023-12-20: 5 mL

## 2023-12-20 MED ORDER — LIDOCAINE 1 % OPTIME INJ - NO CHARGE
1.0000 mL | Freq: Once | INTRAMUSCULAR | Status: AC
  Administered 2023-12-20: 1 mL
  Filled 2023-12-20: qty 2

## 2023-12-21 ENCOUNTER — Encounter: Payer: Self-pay | Admitting: *Deleted

## 2023-12-21 ENCOUNTER — Telehealth: Payer: Self-pay

## 2023-12-21 DIAGNOSIS — N2581 Secondary hyperparathyroidism of renal origin: Secondary | ICD-10-CM | POA: Diagnosis not present

## 2023-12-21 DIAGNOSIS — N250 Renal osteodystrophy: Secondary | ICD-10-CM | POA: Diagnosis not present

## 2023-12-21 DIAGNOSIS — N186 End stage renal disease: Secondary | ICD-10-CM | POA: Diagnosis not present

## 2023-12-21 DIAGNOSIS — D509 Iron deficiency anemia, unspecified: Secondary | ICD-10-CM | POA: Diagnosis not present

## 2023-12-21 DIAGNOSIS — Z992 Dependence on renal dialysis: Secondary | ICD-10-CM | POA: Diagnosis not present

## 2023-12-21 LAB — SURGICAL PATHOLOGY

## 2023-12-21 NOTE — Progress Notes (Signed)
 Received referral for newly diagnosed breast cancer from [facility].  Navigation initiated.  Jocelyn Wells is already an established patient of Dr. Darold and has an appt. For Friday 11/14.   Her and her husband will discuss surgeons and decide who they would like to see.  They would also like to discuss with Dr. Melanee on Friday.

## 2023-12-21 NOTE — Telephone Encounter (Signed)
 Copied from CRM [id number]. Topic: Clinical - Medical Advice >> Dec 21, 2023 11:27 AM Zy'onna H wrote: Reason for CRM: Patient calling to inform her care team of a new diagnosis.  The patient is informing her PCP/PCP Team that she has Breast Cancer.   Please Reach out to the patient if there are anymore questions or concerns regarding this new health information.   Callback Number: #[phone]

## 2023-12-23 ENCOUNTER — Inpatient Hospital Stay: Admitting: Oncology

## 2023-12-23 ENCOUNTER — Inpatient Hospital Stay: Attending: Oncology

## 2023-12-23 ENCOUNTER — Encounter: Payer: Self-pay | Admitting: Oncology

## 2023-12-23 VITALS — BP 129/63 | HR 73 | Temp 97.9°F | Resp 21 | Ht 66.0 in | Wt 202.4 lb

## 2023-12-23 DIAGNOSIS — N250 Renal osteodystrophy: Secondary | ICD-10-CM | POA: Diagnosis not present

## 2023-12-23 DIAGNOSIS — F17210 Nicotine dependence, cigarettes, uncomplicated: Secondary | ICD-10-CM | POA: Diagnosis not present

## 2023-12-23 DIAGNOSIS — D631 Anemia in chronic kidney disease: Secondary | ICD-10-CM | POA: Insufficient documentation

## 2023-12-23 DIAGNOSIS — N186 End stage renal disease: Secondary | ICD-10-CM | POA: Diagnosis not present

## 2023-12-23 DIAGNOSIS — D473 Essential (hemorrhagic) thrombocythemia: Secondary | ICD-10-CM

## 2023-12-23 DIAGNOSIS — Z1721 Progesterone receptor positive status: Secondary | ICD-10-CM | POA: Diagnosis not present

## 2023-12-23 DIAGNOSIS — C50919 Malignant neoplasm of unspecified site of unspecified female breast: Secondary | ICD-10-CM

## 2023-12-23 DIAGNOSIS — J449 Chronic obstructive pulmonary disease, unspecified: Secondary | ICD-10-CM | POA: Diagnosis not present

## 2023-12-23 DIAGNOSIS — D509 Iron deficiency anemia, unspecified: Secondary | ICD-10-CM | POA: Insufficient documentation

## 2023-12-23 DIAGNOSIS — I120 Hypertensive chronic kidney disease with stage 5 chronic kidney disease or end stage renal disease: Secondary | ICD-10-CM | POA: Diagnosis not present

## 2023-12-23 DIAGNOSIS — C50812 Malignant neoplasm of overlapping sites of left female breast: Secondary | ICD-10-CM | POA: Diagnosis not present

## 2023-12-23 DIAGNOSIS — N184 Chronic kidney disease, stage 4 (severe): Secondary | ICD-10-CM | POA: Diagnosis not present

## 2023-12-23 DIAGNOSIS — Z9012 Acquired absence of left breast and nipple: Secondary | ICD-10-CM | POA: Diagnosis not present

## 2023-12-23 DIAGNOSIS — Z992 Dependence on renal dialysis: Secondary | ICD-10-CM | POA: Diagnosis not present

## 2023-12-23 DIAGNOSIS — D500 Iron deficiency anemia secondary to blood loss (chronic): Secondary | ICD-10-CM

## 2023-12-23 DIAGNOSIS — Z8673 Personal history of transient ischemic attack (TIA), and cerebral infarction without residual deficits: Secondary | ICD-10-CM | POA: Diagnosis not present

## 2023-12-23 DIAGNOSIS — Z170 Estrogen receptor positive status [ER+]: Secondary | ICD-10-CM | POA: Insufficient documentation

## 2023-12-23 DIAGNOSIS — N2581 Secondary hyperparathyroidism of renal origin: Secondary | ICD-10-CM | POA: Diagnosis not present

## 2023-12-23 DIAGNOSIS — Z79899 Other long term (current) drug therapy: Secondary | ICD-10-CM

## 2023-12-23 DIAGNOSIS — Z1732 Human epidermal growth factor receptor 2 negative status: Secondary | ICD-10-CM | POA: Diagnosis not present

## 2023-12-23 DIAGNOSIS — Z809 Family history of malignant neoplasm, unspecified: Secondary | ICD-10-CM | POA: Insufficient documentation

## 2023-12-23 DIAGNOSIS — Z7901 Long term (current) use of anticoagulants: Secondary | ICD-10-CM | POA: Diagnosis not present

## 2023-12-23 LAB — IRON AND TIBC
Iron: 63 ug/dL (ref 28–170)
Saturation Ratios: 22 % (ref 10.4–31.8)
TIBC: 291 ug/dL (ref 250–450)
UIBC: 228 ug/dL

## 2023-12-23 LAB — CBC WITH DIFFERENTIAL (CANCER CENTER ONLY)
Abs Immature Granulocytes: 0.09 K/uL — ABNORMAL HIGH (ref 0.00–0.07)
Basophils Absolute: 0.1 K/uL (ref 0.0–0.1)
Basophils Relative: 1 %
Eosinophils Absolute: 0.1 K/uL (ref 0.0–0.5)
Eosinophils Relative: 0 %
HCT: 39.2 % (ref 36.0–46.0)
Hemoglobin: 12.2 g/dL (ref 12.0–15.0)
Immature Granulocytes: 1 %
Lymphocytes Relative: 9 %
Lymphs Abs: 1.3 K/uL (ref 0.7–4.0)
MCH: 30.2 pg (ref 26.0–34.0)
MCHC: 31.1 g/dL (ref 30.0–36.0)
MCV: 97 fL (ref 80.0–100.0)
Monocytes Absolute: 0.7 K/uL (ref 0.1–1.0)
Monocytes Relative: 5 %
Neutro Abs: 11.6 K/uL — ABNORMAL HIGH (ref 1.7–7.7)
Neutrophils Relative %: 84 %
Platelet Count: 554 K/uL — ABNORMAL HIGH (ref 150–400)
RBC: 4.04 MIL/uL (ref 3.87–5.11)
RDW: 14.3 % (ref 11.5–15.5)
WBC Count: 13.8 K/uL — ABNORMAL HIGH (ref 4.0–10.5)
nRBC: 0 % (ref 0.0–0.2)

## 2023-12-23 LAB — FERRITIN: Ferritin: 170 ng/mL (ref 11–307)

## 2023-12-23 NOTE — Progress Notes (Signed)
 Patient is worried about findings from [place].

## 2023-12-24 NOTE — Progress Notes (Unsigned)
 Hematology/Oncology Consult note [facility]  [phone] [phone]  Patient Care Team: Donzella Lauraine SAILOR, DO as PCP - General (Family Medicine) Darliss Rogue, MD as PCP - Cardiology (Cardiology) Damian Therisa HERO, MD as Physician Assistant (Endocrinology) Fernande Elspeth BROCKS, MD (Inactive) as Consulting Physician (Cardiology) Mohammed Goldstein, MD (Inactive) as Referring Physician (Psychiatry) Hester Alm BROCKS, MD as Consulting Physician (Dermatology) Dimmig, Debby, MD as Referring Physician (Orthopedic Surgery) Schnier, Cordella MATSU, MD (Vascular Surgery) Erasmo Bernardino BRAVO, OD (Optometry) Tamea Dedra CROME, MD as Consulting Physician (Pulmonary Disease) Melanee Annah BROCKS, MD as Consulting Physician (Oncology)   Name of the patient: Jocelyn Wells  [id number]  02-08-1956   Date of visit: 12/24/23  Diagnosis- 1. H/o iron  deficiency anemia 2. JAK2 positive thrombocytosis likely ET 3. New diagnosis of multifocal left breast cancer  Chief complaint/ Reason for visit- discuss breast biopsy results and further management  Heme/Onc history: Patient is a 68 year old female with a past medical history significant for ESRD on dialysis, hypertension hyperlipidemia COPD, history of stroke currently on Eliquis .  She has seen me in the past for iron  deficiency anemia and has required IV iron .Patient had an EGD and colonoscopy in January 2024 by Dr. Unk we did not show any evidence of bleeding.  Endoscopy revealed mild chronic gastritis that was negative for H. pylori.She also had a capsule study which did not show any active bleeding   Patient was also diagnosed with JAK2 positive thrombocytosis likely essential thrombocytosis for which she is on Hydrea   Patient self palpated a left breast mass which led to bilateral diagnostic mammogram On 12/14/2023.  This did not show any evidence of malignancy in the right breast but she was found to have multifocal left breast  malignancy.  17 x 19 x 18 mm mass at the 4 o'clock position 3 cm from the nipple.  11 x 8 x 7 mm mass at the 8 o'clock position 3 cm from the nipple.  8 mm mass 4 cm from the nipple at the 9:30 position.  10 mm irregular hypoechoic mass at 9 o'clock position 7 cm from the nipple.  Targeted ultrasound of the left axilla did not show any evidence of abnormal lymph nodes.  Patient had a core biopsy of 2 of the left breast masses at the 4:00 and 8 o'clock position and both of them were invasive mammary carcinoma with mucinous and micropapillary features overall grade 2.  Tumor was strongly ER/PR +100% and HER2 +1 negative.  Interval history-patient is tolerating dialysis well.  She is presently on Hydrea  500 mg every other day for her essential thrombocytosis and she is compliant with that dose.  ECOG PS- 2 Pain scale- 0   Review of systems- Review of Systems  Constitutional:  Positive for malaise/fatigue. Negative for chills, fever and weight loss.  HENT:  Negative for congestion, ear discharge and nosebleeds.   Eyes:  Negative for blurred vision.  Respiratory:  Positive for shortness of breath. Negative for cough, hemoptysis, sputum production and wheezing.   Cardiovascular:  Negative for chest pain, palpitations, orthopnea and claudication.  Gastrointestinal:  Negative for abdominal pain, blood in stool, constipation, diarrhea, heartburn, melena, nausea and vomiting.  Genitourinary:  Negative for dysuria, flank pain, frequency, hematuria and urgency.  Musculoskeletal:  Negative for back pain, joint pain and myalgias.  Skin:  Negative for rash.  Neurological:  Negative for dizziness, tingling, focal weakness, seizures, weakness and headaches.  Endo/Heme/Allergies:  Does not bruise/bleed easily.  Psychiatric/Behavioral:  Negative for depression and suicidal ideas. The patient does not have insomnia.       Allergies  Allergen Reactions   Ivp Dye [Iodinated Contrast Media] Anaphylaxis    Kidney  Failure   Codeine Nausea And Vomiting     Past Medical History:  Diagnosis Date   Acute ischemic left MCA stroke (HCC) 01/29/2015   a.) MRI brain 01/29/2015: acute left MCA territory infarcts involving the posterior left frontal lobe and left insular cortex   Acute renal failure superimposed on stage 4 chronic kidney disease (HCC) 01/29/2015   Allergy    Anemia of chronic renal failure    Anxiety    Arthritis    CHF (congestive heart failure) (HCC)    Complication of anesthesia    a.) PONV; b.) awareness under anesthesia   COPD (chronic obstructive pulmonary disease) (HCC)    Depression    Dyspnea    ESRD (end stage renal disease) (HCC)    Headache    HFrEF (heart failure with reduced ejection fraction) (HCC)    a.) TTE 01/30/2015: EF 30-35%, diff HK, LAE, G2DD; b.) TTE 06/13/2015: EF 40-45%, diff HK, G1DD; c.) TTE 10/06/2017: EF 60-65% MAC, no IAS; d.) TTE 09/29/2021: EF 45-50%;, LVH, LAE, AoV sclerosis, G1DD; e.) TTE 02/24/2022: EF 60-65%, no IAS   HTN (hypertension)    Leukocytosis    Long term current use of anticoagulant    a.) apixaban    Long term current use of antithrombotics/antiplatelets    a.) clopidogrel    NICM (nonischemic cardiomyopathy) (HCC)    a.) TTE 01/30/2015: EF 30-35%; b.) R/LHC 03/25/2015: EF 25-30%, mRA 13, mPA 30, mPCWP 26, LVEDP 17, CO 3.66, CI 1.8; c.) TTE 06/13/2015: EF 40-45%; d.) TTE 10/06/2017: EF 60-65%; e.) TTE 09/29/2021: EF 45-50%; f.) TTE 02/24/2022: EF 60-65%   Obesity    On supplemental oxygen  by nasal cannula    a.) 2L/[place] at night   Osteoporosis    Peripheral vascular disease    Type 2 diabetes mellitus treated with insulin  [facility])      Past Surgical History:  Procedure Laterality Date   ABDOMINAL HYSTERECTOMY     ANKLE FRACTURE SURGERY Left 02/09/2000   AV FISTULA PLACEMENT Left 06/18/2022   Procedure: ARTERIOVENOUS (AV) FISTULA CREATION (BRACHIAL AXILLA);  Surgeon: Jama Cordella MATSU, MD;  Location: [facility] ORS;  Service: Vascular;   Laterality: Left;   BILATERAL SALPINGOOPHORECTOMY  02/08/1998   BREAST BIOPSY Left    US  BX LEFT BREAST COIL CLIP-PATH PENDING   BREAST BIOPSY Left 12/20/2023   US  LT BREAST BX W LOC DEV 1ST LESION IMG BX SPEC US  GUIDE 12/20/2023 [facility]   BREAST BIOPSY Left 12/20/2023   US  LT BREAST BX W LOC DEV EA ADD LESION IMG BX SPEC US  GUIDE 12/20/2023 [facility]   CARDIAC CATHETERIZATION N/A 03/25/2015   Procedure: Right/Left Heart Cath and Coronary Angiography;  Surgeon: Victory LELON Sharps, MD;  Location: [facility] INVASIVE CV LAB;  Service: Cardiovascular;  Laterality: N/A;   CESAREAN SECTION  02/09/1976   COLONOSCOPY WITH PROPOFOL  N/A 09/22/2016   Procedure: COLONOSCOPY WITH PROPOFOL ;  Surgeon: Viktoria Lamar DASEN, MD;  Location: [facility] ENDOSCOPY;  Service: Endoscopy;  Laterality: N/A;   COLONOSCOPY WITH PROPOFOL  N/A 02/15/2022   Procedure: COLONOSCOPY WITH PROPOFOL ;  Surgeon: Unk Corinn Skiff, MD;  Location: [facility] ENDOSCOPY;  Service: Gastroenterology;  Laterality: N/A;   DIALYSIS/PERMA CATHETER INSERTION N/A 01/19/2023   Procedure: DIALYSIS/PERMA CATHETER INSERTION;  Surgeon: Marea Selinda RAMAN, MD;  Location: [facility] INVASIVE CV LAB;  Service: Cardiovascular;  Laterality: N/A;   EP IMPLANTABLE DEVICE N/A 08/05/2015   Procedure: Loop Recorder Insertion;  Surgeon: Elspeth JAYSON Sage, MD;  Location: [facility] INVASIVE CV LAB;  Service: Cardiovascular;  Laterality: N/A;   ESOPHAGOGASTRODUODENOSCOPY N/A 02/15/2022   Procedure: ESOPHAGOGASTRODUODENOSCOPY (EGD);  Surgeon: Unk Corinn Skiff, MD;  Location: [facility] ENDOSCOPY;  Service: Gastroenterology;  Laterality: N/A;   ESOPHAGOGASTRODUODENOSCOPY (EGD) WITH PROPOFOL  N/A 09/22/2016   Procedure: ESOPHAGOGASTRODUODENOSCOPY (EGD) WITH PROPOFOL ;  Surgeon: Viktoria Lamar DASEN, MD;  Location: [facility] ENDOSCOPY;  Service: Endoscopy;  Laterality: N/A;   ESOPHAGOGASTRODUODENOSCOPY (EGD) WITH PROPOFOL  N/A 12/08/2016   Procedure: ESOPHAGOGASTRODUODENOSCOPY (EGD) WITH PROPOFOL ;  Surgeon:  Viktoria Lamar DASEN, MD;  Location: [facility] ENDOSCOPY;  Service: Endoscopy;  Laterality: N/A;   ESOPHAGOGASTRODUODENOSCOPY (EGD) WITH PROPOFOL  N/A 10/12/2022   Procedure: ESOPHAGOGASTRODUODENOSCOPY (EGD) WITH PROPOFOL ;  Surgeon: Unk Corinn Skiff, MD;  Location: [facility] ENDOSCOPY;  Service: Gastroenterology;  Laterality: N/A;   FACIAL LACERATION REPAIR N/A 12/27/2022   Procedure: FACIAL LACERATION REPAIR;  Surgeon: Luciano Elspeth, MD;  Location: [facility] OR;  Service: ENT;  Laterality: N/A;   FRACTURE SURGERY     GIVENS CAPSULE STUDY N/A 04/19/2022   Procedure: GIVENS CAPSULE STUDY;  Surgeon: Unk Corinn Skiff, MD;  Location: [facility] ENDOSCOPY;  Service: Gastroenterology;  Laterality: N/A;   GIVENS CAPSULE STUDY N/A 10/12/2022   Procedure: GIVENS CAPSULE STUDY;  Surgeon: Unk Corinn Skiff, MD;  Location: [facility] ENDOSCOPY;  Service: Gastroenterology;  Laterality: N/A;   INSERTION HYBRID ANTERIOVENOUS GORTEX GRAFT  06/18/2022   Procedure: INSERTION HYBRID ANTERIOVENOUS GORTEX GRAFT;  Surgeon: Jama Cordella MATSU, MD;  Location: [facility] ORS;  Service: Vascular;;   KNEE ARTHROSCOPY Left 02/09/2003   LOWER EXTREMITY ANGIOGRAPHY Left 08/12/2021   Procedure: Lower Extremity Angiography;  Surgeon: Jama Cordella MATSU, MD;  Location: [facility] INVASIVE CV LAB;  Service: Cardiovascular;  Laterality: Left;   PERIPHERAL VASCULAR THROMBECTOMY Left 01/19/2023   Procedure: PERIPHERAL VASCULAR THROMBECTOMY;  Surgeon: Marea Selinda RAMAN, MD;  Location: [facility] INVASIVE CV LAB;  Service: Cardiovascular;  Laterality: Left;   TEE WITHOUT CARDIOVERSION N/A 01/31/2015   Procedure: TRANSESOPHAGEAL ECHOCARDIOGRAM (TEE);  Surgeon: Redell RAMAN Shallow, MD;  Location: [facility] ENDOSCOPY;  Service: Cardiovascular;  Laterality: N/A;   TEMPORARY DIALYSIS CATHETER N/A 08/14/2021   Procedure: TEMPORARY DIALYSIS CATHETER;  Surgeon: Jama Cordella MATSU, MD;  Location: [facility] INVASIVE CV LAB;  Service: Cardiovascular;  Laterality: N/A;   TUBAL LIGATION  02/09/1976   ULNAR  NERVE TRANSPOSITION  02/08/2006   US  LT BREAST BX W LOC DEV 1ST LESION IMG BX SPEC US  GUIDE Left    US  BX LEFT BREAST RIBBON CLIP-PATH PENDING    Social History   Socioeconomic History   Marital status: Married    Spouse name: Garnette   Number of children: 2   Years of education: Not on file   Highest education level: Bachelor's degree (e.g., BA, AB, BS)  Occupational History   Occupation: disabled    Comment: retired  Tobacco Use   Smoking status: Some Days    Current packs/day: 0.00    Average packs/day: 0.5 packs/day for 48.3 years (24.2 ttl pk-yrs)    Types: Cigarettes    Start date: 02/08/1974    Last attempt to quit: 05/31/2022    Years since quitting: 1.5   Smokeless tobacco: Never  Vaping Use   Vaping status: Never Used  Substance and Sexual Activity   Alcohol use: No    Alcohol/week: 0.0 standard drinks of alcohol   Drug use: No   Sexual activity:  Yes    Birth control/protection: None  Other Topics Concern   Not on file  Social History Narrative   Lives at home with stepson and husband, dogs and cats   Caffeine  Drinks sweet tea.   Right handed.    Social Drivers of Corporate Investment Banker Strain: Low Risk  (11/17/2023)   Received from [facility]   Overall Financial Resource Strain (CARDIA)    Difficulty of Paying Living Expenses: Not hard at all  Food Insecurity: No Food Insecurity (11/17/2023)   Received from [facility]   Hunger Vital Sign    Within the past 12 months, you worried that your food would run out before you got the money to buy more.: Never true    Within the past 12 months, the food you bought just didn't last and you didn't have money to get more.: Never true  Transportation Needs: No Transportation Needs (11/17/2023)   Received from [facility] - Transportation    In the past 12 months, has lack of transportation kept you from medical appointments or from getting  medications?: No    Lack of Transportation (Non-Medical): No  Recent Concern: Transportation Needs - Unmet Transportation Needs (09/08/2023)   PRAPARE - Transportation    Lack of Transportation (Medical): Yes    Lack of Transportation (Non-Medical): Yes  Physical Activity: Inactive (09/08/2023)   Exercise Vital Sign    Days of Exercise per Week: 0 days    Minutes of Exercise per Session: Not on file  Stress: No Stress Concern Present (09/08/2023)   [employer] of Occupational Health - Occupational Stress Questionnaire    Feeling of Stress: Only a little  Social Connections: Moderately Isolated (09/08/2023)   Social Connection and Isolation Panel    Frequency of Communication with Friends and Family: More than three times a week    Frequency of Social Gatherings with Friends and Family: Twice a week    Attends Religious Services: Never    Database Administrator or Organizations: No    Attends Engineer, Structural: Not on file    Marital Status: Married  Catering Manager Violence: Not At Risk (12/27/2022)   Humiliation, Afraid, Rape, and Kick questionnaire    Fear of Current or Ex-Partner: No    Emotionally Abused: No    Physically Abused: No    Sexually Abused: No    Family History  Problem Relation Age of Onset   Heart disease Mother        died from CHF   Asthma Mother    Diabetes Mother    Heart disease Father    Aneurysm Father    COPD Brother    Diabetes Brother    Alcohol abuse Paternal Aunt    Cancer Maternal Grandmother        unknownorigin   Anemia Neg Hx    Arrhythmia Neg Hx    Clotting disorder Neg Hx    Fainting Neg Hx    Heart attack Neg Hx    Heart failure Neg Hx    Hyperlipidemia Neg Hx    Hypertension Neg Hx      Current Outpatient Medications:    albuterol  (VENTOLIN  HFA) 108 (90 Base) MCG/ACT inhaler, TAKE 2 PUFFS BY MOUTH EVERY 6 HOURS AS NEEDED FOR WHEEZE OR SHORTNESS OF BREATH, Disp: 8.5 each, Rfl: 2   atorvastatin  (LIPITOR) 40 MG  tablet, Take 1 tablet (40 mg total) by mouth  daily., Disp: 90 tablet, Rfl: 3   buPROPion  (WELLBUTRIN  SR) 150 MG 12 hr tablet, Take 1 tablet (150 mg total) by mouth 2 (two) times daily., Disp: 180 tablet, Rfl: 3   calcium  carbonate (TUMS EX) 750 MG chewable tablet, Chew 1 tablet by mouth daily., Disp: , Rfl:    ELIQUIS  5 MG TABS tablet, TAKE 1 TABLET TWICE A DAY, Disp: 180 tablet, Rfl: 2   fluticasone  (FLONASE ) 50 MCG/ACT nasal spray, Place 2 sprays into both nostrils daily. (Patient taking differently: Place 2 sprays into both nostrils as needed.), Disp: 16 g, Rfl: 6   gabapentin  (NEURONTIN ) 300 MG capsule, Take 300 mg by mouth daily., Disp: , Rfl:    hydroxyurea  (HYDREA ) 500 MG capsule, TAKE 1 CAPSULE BY MOUTH EVERY OTHER DAY. MAY TAKE WITH FOOD TO MINIMIZE GI SIDE EFFECTS. (Patient taking differently: Take 500 mg by mouth every other day.), Disp: 45 capsule, Rfl: 1   insulin  aspart (NOVOLOG ) 100 UNIT/ML injection, Insulin  pump, Disp: , Rfl:    Insulin  Disposable Pump (OMNIPOD DASH PODS, GEN 4,) MISC, USE 1 POD EACH EVERY 72    HOURS, Disp: , Rfl:    loratadine  (CLARITIN ) 10 MG tablet, Take 1 tablet by mouth daily., Disp: , Rfl:    meclizine  (ANTIVERT ) 12.5 MG tablet, Take 1 tablet (12.5 mg total) by mouth 3 (three) times daily as needed for dizziness., Disp: 30 tablet, Rfl: 0   OXYGEN , Place 2 L into the nose continuous., Disp: , Rfl:    PARoxetine  (PAXIL ) 40 MG tablet, Take 1 tablet (40 mg total) by mouth daily., Disp: 90 tablet, Rfl: 3   torsemide  (DEMADEX ) 20 MG tablet, TAKE 3 TABLETS BY MOUTH EVERY DAY, Disp: 270 tablet, Rfl: 1   traMADol  (ULTRAM ) 50 MG tablet, Take 1 tablet (50 mg total) by mouth 2 (two) times daily as needed., Disp: 60 tablet, Rfl: 2   Vitamin D , Ergocalciferol , (DRISDOL ) 1.25 MG (50000 UNIT) CAPS capsule, Take 50,000 Units by mouth once a week., Disp: , Rfl:    Insulin  Human (INSULIN  PUMP) SOLN, Per endocrinoloy, Disp: , Rfl:    Spacer/Aero-Holding Chambers (OPTICHAMBER  DIAMOND ) MISC, Use with inhaler to ensure medication is delivered throughout lungs (Patient not taking: Reported on 12/23/2023), Disp: 1 each, Rfl: 0  Physical exam:  Vitals:   12/23/23 1404  BP: 129/63  Pulse: 73  Resp: (!) 21  Temp: 97.9 F (36.6 C)  TempSrc: Tympanic  SpO2: 95%  Weight: 202 lb 6.4 oz (91.8 kg)  Height: 5' 6 (1.676 m)   Physical Exam Cardiovascular:     Rate and Rhythm: Normal rate and regular rhythm.     Heart sounds: Normal heart sounds.  Pulmonary:     Effort: Pulmonary effort is normal.     Breath sounds: Normal breath sounds.  Skin:    General: Skin is warm and dry.  Neurological:     Mental Status: She is alert and oriented to person, place, and time.      I have personally reviewed labs listed below:    Latest Ref Rng & Units 09/02/2023    5:32 PM  CMP  Glucose 70 - 99 mg/dL 697   BUN 8 - 23 mg/dL 48   Creatinine 9.55 - 1.00 mg/dL 5.67   Sodium 864 - 854 mmol/L 142   Potassium 3.5 - 5.1 mmol/L 3.6   Chloride 98 - 111 mmol/L 104   CO2 22 - 32 mmol/L 24   Calcium  8.9 - 10.3 mg/dL 6.9  Latest Ref Rng & Units 12/23/2023    1:45 PM  CBC  WBC 4.0 - 10.5 K/uL 13.8   Hemoglobin 12.0 - 15.0 g/dL 87.7   Hematocrit 63.9 - 46.0 % 39.2   Platelets 150 - 400 K/uL 554    I have personally reviewed Radiology images listed below: No images are attached to the encounter.  US  LT BREAST BX W LOC DEV 1ST LESION IMG BX SPEC US  GUIDE Addendum Date: 12/21/2023 ADDENDUM REPORT: 12/21/2023 11:39 ADDENDUM: PATHOLOGY revealed: Site 1. Breast, left, needle core biopsy, 4 o'clock, 3 cmfn, 19 mm, ribbon clip- INVASIVE MAMMARY CARCINOMA WITH MUCINOUS AND MICROPAPILLARY FEATURES- OVERALL GRADE-2 - LYMPHOVASCULAR INVASION: NOT IDENTIFIED- CANCER LENGTH: 10 MM- CALCIFICATIONS: NOT IDENTIFIED- DUCTAL CARCINOMA IN SITU: NOT IDENTIFIED Pathology results are CONCORDANT with imaging findings, per Dr. Norleen Croak. PATHOLOGY revealed: Site 2. Breast, left, needle core  biopsy, 8 o'clock, 3 cmfn, 11 mm, coil clip- INVASIVE MAMMARY CARCINOMA WITH MICROPAPILLARY FEATURES- OVERALL GRADE: 2- LYMPHOVASCULAR INVASION: AREAS SUSPICIOUS FOR - CANCER LENGTH: 10 MM- CALCIFICATIONS: NOT IDENTIFIED- DUCTAL CARCINOMA IN SITU: NOT IDENTIFIED Pathology results are CONCORDANT with imaging findings, per Dr. Norleen Croak. Pathology results and recommendations below were discussed with patient's husband by telephone on 12/21/2023 by Mliss Molt RN. Patient's husband reported biopsy site within normal limits with slight tenderness at the site. Post biopsy care instructions were reviewed, questions were answered and my direct phone number was provided to. Patient's husband was instructed to call [facility] if any concerns or questions arise related to the biopsy. RECOMMENDATIONS: 1. Surgical and oncological consultation. Request for surgical and oncological consultation relayed to Shasta Ada RN at [facility] by Mliss Molt RN via Epic In [place] on 12/21/2023. Pathology results reported by Mliss Molt RN 12/21/2023. Electronically Signed   By: Norleen Croak M.D.   On: 12/21/2023 11:39   Result Date: 12/21/2023 CLINICAL DATA:  Multicentric BI-RADS 5 masses EXAM: ULTRASOUND GUIDED LEFT BREAST CORE NEEDLE BIOPSY x2 COMPARISON:  Previous exam(s). PROCEDURE: I met with the patient and we discussed the procedure of ultrasound-guided biopsy, including benefits and alternatives. We discussed the high likelihood of a successful procedure. We discussed the risks of the procedure, including infection, bleeding, tissue injury, clip migration, and inadequate sampling. Informed written consent was given. The usual time-out protocol was performed immediately prior to the procedure. Site 1: LEFT breast mass, 4 o'clock 3 cm from the nipple Lesion quadrant: Lower outer quadrant Using sterile technique and 1% lidocaine  and 1% lidocaine  with epinephrine  as local anesthetic, under direct  ultrasound visualization, a 14 gauge spring-loaded device was used to perform biopsy of the LEFT breast mass at 4 o'clock 3 cm from the nipple using a transverse approach. At the conclusion of the procedure ribbon-shaped tissue marker clip was deployed into the biopsy cavity. Site 2: LEFT breast mass, 8 o'clock 3 cm from the nipple Lesion quadrant: Lower inner Using sterile technique and 1% lidocaine  and 1% lidocaine  with epinephrine  as local anesthetic, under direct ultrasound visualization, a 14 gauge spring-loaded device was used to perform biopsy of the LEFT breast mass at 8 o'clock 3 cm from the nipple using a radial approach. At the conclusion of the procedure coil-shaped tissue marker clip was deployed into the biopsy cavity. Follow up 2 view mammogram was performed and dictated separately. IMPRESSION: Ultrasound guided biopsy of LEFT breast masses at 4 o'clock and 8 o'clock. No apparent complications. Electronically Signed: By: Norleen Croak M.D. On: 12/20/2023 09:46  US  LT BREAST BX W LOC DEV EA ADD LESION IMG BX SPEC US  GUIDE Addendum Date: 12/21/2023 ADDENDUM REPORT: 12/21/2023 11:39 ADDENDUM: PATHOLOGY revealed: Site 1. Breast, left, needle core biopsy, 4 o'clock, 3 cmfn, 19 mm, ribbon clip- INVASIVE MAMMARY CARCINOMA WITH MUCINOUS AND MICROPAPILLARY FEATURES- OVERALL GRADE-2 - LYMPHOVASCULAR INVASION: NOT IDENTIFIED- CANCER LENGTH: 10 MM- CALCIFICATIONS: NOT IDENTIFIED- DUCTAL CARCINOMA IN SITU: NOT IDENTIFIED Pathology results are CONCORDANT with imaging findings, per Dr. Norleen Croak. PATHOLOGY revealed: Site 2. Breast, left, needle core biopsy, 8 o'clock, 3 cmfn, 11 mm, coil clip- INVASIVE MAMMARY CARCINOMA WITH MICROPAPILLARY FEATURES- OVERALL GRADE: 2- LYMPHOVASCULAR INVASION: AREAS SUSPICIOUS FOR - CANCER LENGTH: 10 MM- CALCIFICATIONS: NOT IDENTIFIED- DUCTAL CARCINOMA IN SITU: NOT IDENTIFIED Pathology results are CONCORDANT with imaging findings, per Dr. Norleen Croak. Pathology results and  recommendations below were discussed with patient's husband by telephone on 12/21/2023 by Mliss Molt RN. Patient's husband reported biopsy site within normal limits with slight tenderness at the site. Post biopsy care instructions were reviewed, questions were answered and my direct phone number was provided to. Patient's husband was instructed to call [facility] if any concerns or questions arise related to the biopsy. RECOMMENDATIONS: 1. Surgical and oncological consultation. Request for surgical and oncological consultation relayed to Shasta Ada RN at [facility] by Mliss Molt RN via Epic In [place] on 12/21/2023. Pathology results reported by Mliss Molt RN 12/21/2023. Electronically Signed   By: Norleen Croak M.D.   On: 12/21/2023 11:39   Result Date: 12/21/2023 CLINICAL DATA:  Multicentric BI-RADS 5 masses EXAM: ULTRASOUND GUIDED LEFT BREAST CORE NEEDLE BIOPSY x2 COMPARISON:  Previous exam(s). PROCEDURE: I met with the patient and we discussed the procedure of ultrasound-guided biopsy, including benefits and alternatives. We discussed the high likelihood of a successful procedure. We discussed the risks of the procedure, including infection, bleeding, tissue injury, clip migration, and inadequate sampling. Informed written consent was given. The usual time-out protocol was performed immediately prior to the procedure. Site 1: LEFT breast mass, 4 o'clock 3 cm from the nipple Lesion quadrant: Lower outer quadrant Using sterile technique and 1% lidocaine  and 1% lidocaine  with epinephrine  as local anesthetic, under direct ultrasound visualization, a 14 gauge spring-loaded device was used to perform biopsy of the LEFT breast mass at 4 o'clock 3 cm from the nipple using a transverse approach. At the conclusion of the procedure ribbon-shaped tissue marker clip was deployed into the biopsy cavity. Site 2: LEFT breast mass, 8 o'clock 3 cm from the nipple Lesion quadrant: Lower inner  Using sterile technique and 1% lidocaine  and 1% lidocaine  with epinephrine  as local anesthetic, under direct ultrasound visualization, a 14 gauge spring-loaded device was used to perform biopsy of the LEFT breast mass at 8 o'clock 3 cm from the nipple using a radial approach. At the conclusion of the procedure coil-shaped tissue marker clip was deployed into the biopsy cavity. Follow up 2 view mammogram was performed and dictated separately. IMPRESSION: Ultrasound guided biopsy of LEFT breast masses at 4 o'clock and 8 o'clock. No apparent complications. Electronically Signed: By: Norleen Croak M.D. On: 12/20/2023 09:46   MM CLIP PLACEMENT LEFT Result Date: 12/20/2023 CLINICAL DATA:  Status post LEFT breast ultrasound biopsy x2 EXAM: 3D DIAGNOSTIC LEFT MAMMOGRAM POST ULTRASOUND BIOPSY COMPARISON:  Previous exam(s). ACR Breast Density Category b: There are scattered areas of fibroglandular density. FINDINGS: 3D Mammographic images were obtained following ultrasound guided biopsy of LEFT breast masses at 4 o'clock and 8 o'clock. Appropriate  positioning of the ribbon-shaped biopsy marking clip at the site of biopsy in the LEFT breast at 4 o'clock. Appropriate positioning of the coil-shaped biopsy marking clip at the site of biopsy in the LEFT breast at 8 o'clock. IMPRESSION: Appropriate positioning of the biopsy marking clips at the sites of biopsy in the LEFT breast. Final Assessment: Post Procedure Mammograms for Marker Placement Electronically Signed   By: Norleen Croak M.D.   On: 12/20/2023 09:53   MM 3D DIAGNOSTIC MAMMOGRAM BILATERAL BREAST Result Date: 12/14/2023 CLINICAL DATA:  LEFT breast palpable EXAM: DIGITAL DIAGNOSTIC BILATERAL MAMMOGRAM WITH TOMOSYNTHESIS AND CAD; ULTRASOUND LEFT BREAST LIMITED TECHNIQUE: Bilateral digital diagnostic mammography and breast tomosynthesis was performed. The images were evaluated with computer-aided detection. ; Targeted ultrasound examination of the left breast was  performed. COMPARISON:  Remote prior mammogram from March 2013 ACR Breast Density Category b: There are scattered areas of fibroglandular density. FINDINGS: Spot compression tomosynthesis views were obtained of the site of palpable concern in the LEFT breast. There is a new focal asymmetry with overlying skin thickening noted at the site of palpable concern in the LEFT inner breast. Exact extent of this asymmetry is challenging to ascertain but is estimated to span approximately 7 cm. In addition, there is an irregular spiculated mass with associated architectural distortion noted in the LEFT lower outer breast at anterior depth. There is possible nipple retraction noted. No suspicious mass, distortion, or microcalcifications are identified to suggest presence of malignancy in the RIGHT breast. On physical exam, there is a hard mass appreciated in the outer LEFT breast and inner breast. The skin is hyperpigmented. There is a pearlescent nodule appreciated in the LEFT inner breast. Targeted ultrasound was performed of the LEFT breast. At 4 o'clock 3 cm from nipple, there is an irregular hypoechoic mass with irregular margins. It measures 17 x 19 x 18 mm. This corresponds to the irregular mass noted mammographically. Targeted ultrasound was performed of the LEFT inner breast. Multiple irregular hypoechoic masses noted throughout the LEFT inner breast. Representative irregular mass is documented at 8 o'clock 3 cm from the nipple. This measures 11 x 8 x 7 mm. At 9:30 4 cm from nipple an additional representative 8 mm irregular mass is documented. These likely reflect additional sites of disease. There is overlying skin thickening. At 9 o'clock 7 cm from the nipple, there is an irregular hypoechoic subtle mass located within the dermis with subdermal extension. This measures approximately 10 mm. This corresponds to the pearlescent nodule noted on physical exam. Targeted ultrasound was performed of the LEFT axilla. No  suspicious axillary lymph nodes are visualized. IMPRESSION: 1. There are multiple suspicious masses noted throughout the LEFT breast with dominant 19 mm mass documented at 4 o'clock and an additional dominant 11 mm mass documented at 8 o'clock. These are highly concerning for multicentric malignancy. Recommend 2 site ultrasound-guided biopsy with preferential sampling of a mass in the outer breast (4 o'clock) and an additional mass in the inner breast as per radiologist preference. With malignant results, breast MRI with and without contrast would be recommended to establish extent of disease. 2. There is overlying skin thickening noted as well as a pearlescent nodule noted within the LEFT inner breast which may reflect a dermal metastasis versus inflammatory breast cancer. Skin punch biopsy could be considered by treatment team if it would alter clinical management. 3. No suspicious LEFT axillary adenopathy. 4. No mammographic evidence of malignancy in the RIGHT breast. RECOMMENDATION: LEFT breast ultrasound-guided biopsy X2  With malignant results, breast MRI with and without contrast would be recommended given overlying skin thickening and potential for inflammatory breast cancer. Skin punch biopsy could also be considered as per treatment team. I have discussed the findings and recommendations with the patient and patient's husband. The biopsy procedure was discussed with the patient and questions were answered. Patient expressed their understanding of the biopsy recommendation. Patient will be scheduled for biopsy at her earliest convenience by the schedulers. Ordering provider will be notified. If applicable, a reminder letter will be sent to the patient regarding the next appointment. BI-RADS CATEGORY  5: Highly suggestive of malignancy. Electronically Signed   By: Corean Salter M.D.   On: 12/14/2023 12:36   US  LIMITED ULTRASOUND INCLUDING AXILLA LEFT BREAST  Result Date: 12/14/2023 CLINICAL DATA:   LEFT breast palpable EXAM: DIGITAL DIAGNOSTIC BILATERAL MAMMOGRAM WITH TOMOSYNTHESIS AND CAD; ULTRASOUND LEFT BREAST LIMITED TECHNIQUE: Bilateral digital diagnostic mammography and breast tomosynthesis was performed. The images were evaluated with computer-aided detection. ; Targeted ultrasound examination of the left breast was performed. COMPARISON:  Remote prior mammogram from March 2013 ACR Breast Density Category b: There are scattered areas of fibroglandular density. FINDINGS: Spot compression tomosynthesis views were obtained of the site of palpable concern in the LEFT breast. There is a new focal asymmetry with overlying skin thickening noted at the site of palpable concern in the LEFT inner breast. Exact extent of this asymmetry is challenging to ascertain but is estimated to span approximately 7 cm. In addition, there is an irregular spiculated mass with associated architectural distortion noted in the LEFT lower outer breast at anterior depth. There is possible nipple retraction noted. No suspicious mass, distortion, or microcalcifications are identified to suggest presence of malignancy in the RIGHT breast. On physical exam, there is a hard mass appreciated in the outer LEFT breast and inner breast. The skin is hyperpigmented. There is a pearlescent nodule appreciated in the LEFT inner breast. Targeted ultrasound was performed of the LEFT breast. At 4 o'clock 3 cm from nipple, there is an irregular hypoechoic mass with irregular margins. It measures 17 x 19 x 18 mm. This corresponds to the irregular mass noted mammographically. Targeted ultrasound was performed of the LEFT inner breast. Multiple irregular hypoechoic masses noted throughout the LEFT inner breast. Representative irregular mass is documented at 8 o'clock 3 cm from the nipple. This measures 11 x 8 x 7 mm. At 9:30 4 cm from nipple an additional representative 8 mm irregular mass is documented. These likely reflect additional sites of disease.  There is overlying skin thickening. At 9 o'clock 7 cm from the nipple, there is an irregular hypoechoic subtle mass located within the dermis with subdermal extension. This measures approximately 10 mm. This corresponds to the pearlescent nodule noted on physical exam. Targeted ultrasound was performed of the LEFT axilla. No suspicious axillary lymph nodes are visualized. IMPRESSION: 1. There are multiple suspicious masses noted throughout the LEFT breast with dominant 19 mm mass documented at 4 o'clock and an additional dominant 11 mm mass documented at 8 o'clock. These are highly concerning for multicentric malignancy. Recommend 2 site ultrasound-guided biopsy with preferential sampling of a mass in the outer breast (4 o'clock) and an additional mass in the inner breast as per radiologist preference. With malignant results, breast MRI with and without contrast would be recommended to establish extent of disease. 2. There is overlying skin thickening noted as well as a pearlescent nodule noted within the LEFT inner breast which may  reflect a dermal metastasis versus inflammatory breast cancer. Skin punch biopsy could be considered by treatment team if it would alter clinical management. 3. No suspicious LEFT axillary adenopathy. 4. No mammographic evidence of malignancy in the RIGHT breast. RECOMMENDATION: LEFT breast ultrasound-guided biopsy X2 With malignant results, breast MRI with and without contrast would be recommended given overlying skin thickening and potential for inflammatory breast cancer. Skin punch biopsy could also be considered as per treatment team. I have discussed the findings and recommendations with the patient and patient's husband. The biopsy procedure was discussed with the patient and questions were answered. Patient expressed their understanding of the biopsy recommendation. Patient will be scheduled for biopsy at her earliest convenience by the schedulers. Ordering provider will be  notified. If applicable, a reminder letter will be sent to the patient regarding the next appointment. BI-RADS CATEGORY  5: Highly suggestive of malignancy. Electronically Signed   By: Corean Salter M.D.   On: 12/14/2023 12:36   DG PAIN CLINIC C-ARM 1-60 MIN NO REPORT Result Date: 12/12/2023 Fluoro was used, but no Radiologist interpretation will be provided. Please refer to NOTES tab for provider progress note.    Assessment and plan- Patient is a 68 y.o. female who is here for follow-up of following issues:  Assessment and Plan    Multifocal left breast cancer, ER positive, PR positive, HER2 negative Multifocal left breast cancer with no lymph node involvement.  Tumor is strongly ER/PR positive and HER2 negative therefore slow-growing, less likely chemo-responsive.  She is not going to benefit from neoadjuvant chemotherapy.  Surgery likely requires mastectomy.  I am obtaining baseline bilateral breast MRI and have personally reached out to Dr. Cesar who will see her discuss surgical management.    Radiation post-mastectomy depends on tumor size and lymphedema risk.  - Chemotherapy consideration post-surgery based on Oncotype DX testing. Hormone therapy post-treatment to reduce recurrence risk. - Ordered MRI of the breast to assess for additional masses and evaluate the right breast. - Referred to Dr. Cesar for surgical consultation for left mastectomy. - Will plan for Oncotype DX testing post-surgery to determine chemotherapy benefit.  Patient would like that information with deciding if she wants to pursue adjuvant chemotherapy or not - Will discuss endocrine therapy in greater detail down the line  End-stage renal disease on dialysis End-stage renal disease managed with dialysis. Dialysis catheter in left arm complicates potential radiation therapy post-mastectomy due to lymphedema risk. - Will coordinate with nephrology regarding potential radiation therapy and its impact on  dialysis.  Secondary thrombocytosis Platelet count elevated at 554,000 despite Hydrea  500 mg every other day. Goal is to reduce platelet count to less than 400,000. - Increased Hydrea  to 500 mg six days a week with one day off. - Will repeat blood work in one month to assess platelet count.  Iron  deficiency anemia Managed with dialysis, which includes iron  supplementation. Current hemoglobin level is 12.2, indicating adequate management. - Continue current dialysis regimen with iron  supplementation.  If EPO is needed she will get back with dialysis as well.     I will tentatively see her back in 4 weeks with CBC with differential and CMP   Visit Diagnosis 1. Anemia in stage 4 chronic kidney disease (HCC)   2. Iron  deficiency anemia due to chronic blood loss   3. Malignant neoplasm of female breast, unspecified estrogen receptor status, unspecified laterality, unspecified site of breast (HCC)   4. Essential thrombocytosis (HCC)   5. High risk medication use  Dr. Annah Skene, MD, MPH [facility] at [facility] [phone] 12/24/2023 5:05 PM

## 2023-12-26 ENCOUNTER — Encounter: Payer: Self-pay | Admitting: Oncology

## 2023-12-26 ENCOUNTER — Other Ambulatory Visit: Payer: Self-pay

## 2023-12-26 DIAGNOSIS — N186 End stage renal disease: Secondary | ICD-10-CM | POA: Diagnosis not present

## 2023-12-26 DIAGNOSIS — N2581 Secondary hyperparathyroidism of renal origin: Secondary | ICD-10-CM | POA: Diagnosis not present

## 2023-12-26 DIAGNOSIS — D509 Iron deficiency anemia, unspecified: Secondary | ICD-10-CM | POA: Diagnosis not present

## 2023-12-26 DIAGNOSIS — N250 Renal osteodystrophy: Secondary | ICD-10-CM | POA: Diagnosis not present

## 2023-12-26 DIAGNOSIS — Z992 Dependence on renal dialysis: Secondary | ICD-10-CM | POA: Diagnosis not present

## 2023-12-27 ENCOUNTER — Ambulatory Visit
Admission: RE | Admit: 2023-12-27 | Discharge: 2023-12-27 | Disposition: A | Discharge status: Home / Self Care | Source: Ambulatory Visit | Attending: Oncology | Admitting: Oncology

## 2023-12-27 DIAGNOSIS — D631 Anemia in chronic kidney disease: Secondary | ICD-10-CM | POA: Diagnosis not present

## 2023-12-27 DIAGNOSIS — R928 Other abnormal and inconclusive findings on diagnostic imaging of breast: Secondary | ICD-10-CM | POA: Diagnosis not present

## 2023-12-27 DIAGNOSIS — C50412 Malignant neoplasm of upper-outer quadrant of left female breast: Secondary | ICD-10-CM | POA: Diagnosis not present

## 2023-12-27 DIAGNOSIS — N184 Chronic kidney disease, stage 4 (severe): Secondary | ICD-10-CM | POA: Insufficient documentation

## 2023-12-27 MED ORDER — GADOBUTROL 1 MMOL/ML IV SOLN
9.0000 mL | Freq: Once | INTRAVENOUS | Status: AC | PRN
  Administered 2023-12-27: 9 mL via INTRAVENOUS

## 2023-12-30 ENCOUNTER — Ambulatory Visit: Payer: Self-pay | Admitting: Oncology

## 2023-12-30 DIAGNOSIS — N2581 Secondary hyperparathyroidism of renal origin: Secondary | ICD-10-CM | POA: Diagnosis not present

## 2023-12-30 DIAGNOSIS — Z992 Dependence on renal dialysis: Secondary | ICD-10-CM | POA: Diagnosis not present

## 2023-12-30 DIAGNOSIS — D509 Iron deficiency anemia, unspecified: Secondary | ICD-10-CM | POA: Diagnosis not present

## 2023-12-30 DIAGNOSIS — N186 End stage renal disease: Secondary | ICD-10-CM | POA: Diagnosis not present

## 2023-12-30 DIAGNOSIS — N250 Renal osteodystrophy: Secondary | ICD-10-CM | POA: Diagnosis not present

## 2024-01-02 DIAGNOSIS — Z992 Dependence on renal dialysis: Secondary | ICD-10-CM | POA: Diagnosis not present

## 2024-01-02 DIAGNOSIS — N2581 Secondary hyperparathyroidism of renal origin: Secondary | ICD-10-CM | POA: Diagnosis not present

## 2024-01-02 DIAGNOSIS — N250 Renal osteodystrophy: Secondary | ICD-10-CM | POA: Diagnosis not present

## 2024-01-02 DIAGNOSIS — D509 Iron deficiency anemia, unspecified: Secondary | ICD-10-CM | POA: Diagnosis not present

## 2024-01-02 DIAGNOSIS — N186 End stage renal disease: Secondary | ICD-10-CM | POA: Diagnosis not present

## 2024-01-03 ENCOUNTER — Other Ambulatory Visit: Payer: Self-pay | Admitting: General Surgery

## 2024-01-03 DIAGNOSIS — E1129 Type 2 diabetes mellitus with other diabetic kidney complication: Secondary | ICD-10-CM | POA: Diagnosis not present

## 2024-01-03 DIAGNOSIS — C50812 Malignant neoplasm of overlapping sites of left female breast: Secondary | ICD-10-CM

## 2024-01-03 DIAGNOSIS — E1121 Type 2 diabetes mellitus with diabetic nephropathy: Secondary | ICD-10-CM | POA: Diagnosis not present

## 2024-01-03 DIAGNOSIS — E1142 Type 2 diabetes mellitus with diabetic polyneuropathy: Secondary | ICD-10-CM | POA: Diagnosis not present

## 2024-01-03 DIAGNOSIS — M810 Age-related osteoporosis without current pathological fracture: Secondary | ICD-10-CM | POA: Diagnosis not present

## 2024-01-03 DIAGNOSIS — E1159 Type 2 diabetes mellitus with other circulatory complications: Secondary | ICD-10-CM | POA: Diagnosis not present

## 2024-01-03 DIAGNOSIS — E1122 Type 2 diabetes mellitus with diabetic chronic kidney disease: Secondary | ICD-10-CM | POA: Diagnosis not present

## 2024-01-03 DIAGNOSIS — E113393 Type 2 diabetes mellitus with moderate nonproliferative diabetic retinopathy without macular edema, bilateral: Secondary | ICD-10-CM | POA: Diagnosis not present

## 2024-01-03 DIAGNOSIS — E1165 Type 2 diabetes mellitus with hyperglycemia: Secondary | ICD-10-CM | POA: Diagnosis not present

## 2024-01-03 DIAGNOSIS — Z170 Estrogen receptor positive status [ER+]: Secondary | ICD-10-CM | POA: Diagnosis not present

## 2024-01-03 DIAGNOSIS — C50919 Malignant neoplasm of unspecified site of unspecified female breast: Secondary | ICD-10-CM | POA: Diagnosis not present

## 2024-01-03 DIAGNOSIS — I10 Essential (primary) hypertension: Secondary | ICD-10-CM | POA: Diagnosis not present

## 2024-01-03 DIAGNOSIS — Z9641 Presence of insulin pump (external) (internal): Secondary | ICD-10-CM | POA: Diagnosis not present

## 2024-01-03 DIAGNOSIS — N186 End stage renal disease: Secondary | ICD-10-CM | POA: Diagnosis not present

## 2024-01-03 NOTE — H&P (View-Only) (Signed)
 History of Present Illness Jocelyn Wells is a 68 year old female with breast cancer who presents for evaluation of left breast cancer. She is accompanied by her husband, Elspeth.  She has been experiencing lumps in her left breast for over a month. A diagnostic mammogram on December 12, 2023, revealed multiple suspicious masses throughout the left breast, including a dominant 19 mm mass at four o'clock and an additional 11 mm mass at eight o'clock. Subsequent core biopsies confirmed invasive mammary carcinoma with micropapillary features, both ER positive and PR positive.  An MRI confirmed the presence of extensive multiple masses in the left breast extending to the skin on the medial side of the breast. No lumps or abnormalities are present in the right breast, and there are no skin changes, redness, or nipple discharge.  I personally evaluated the images of the mammogram, ultrasound and bilateral breast MRI.  Patient was evaluated by medical oncology.  They do not recommend neoadjuvant chemotherapy.  They refer patient for further evaluation for surgical management of multicentric breast cancer.  She is currently undergoing dialysis three times a week.      PAST MEDICAL HISTORY:  Past Medical History:  Diagnosis Date   Allergic state    Aortic atherosclerosis 11/2017   see on spine Xray   Arthritis    Carotid arterial disease    US  carotid with <50% stenosis bilat (09/2017)   Compression fracture of body of thoracic vertebra (CMS/HHS-HCC) 11/2017   mild compression fracture T12    COPD (chronic obstructive pulmonary disease) (CMS/HHS-HCC)    Depression    Diabetic retinopathy (CMS/HHS-HCC)    DVT (deep venous thrombosis) (CMS/HHS-HCC) 01/27/2018   Rt leg   Gout    Hemorrhoids    Hyperlipemia    Microalbuminuria    Migraines    Osteoporosis, post-menopausal    Psoriasis    Stroke (cerebrum) (CMS/HHS-HCC)    Tobacco dependence    Type 2 diabetes mellitus  (CMS/HHS-HCC)    Vitamin D  deficiency         PAST SURGICAL HISTORY:   Past Surgical History:  Procedure Laterality Date   CESAREAN SECTION  1978   HYSTERECTOMY VAGINAL  2000   ARTHROSCOPY KNEE  2005   ulnar nerve transplant  2008   COLONOSCOPY  09/22/2016   PH Colon Polyps in past: [facility] 09/2021   EGD  09/22/2016   Gastric Ulcer: [facility] 12/2016   EGD  12/08/2016   No repeat per RTE   PHOTOREFRACTIVE KERATOTOMY/LASIK Bilateral 12/09/2020   OTHER SURGERY Left 07/10/2022   Fistula L Arm   broken ankle  2002&2003   COLONOSCOPY     [facility] in [place]   COLONOSCOPY     Repeat Colon 2 yrs later in [place]   HYSTERECTOMY           MEDICATIONS:  Outpatient Encounter Medications as of 01/03/2024  Medication Sig Dispense Refill   albuterol  90 mcg/actuation inhaler      apixaban  (ELIQUIS ) 5 mg tablet Take 5 mg by mouth every 12 (twelve) hours     buPROPion  (WELLBUTRIN  SR) 150 MG SR tablet Take 150 mg by mouth 2 (two) times daily     calcium  carbonate 300 mg (750 mg) chewable tablet Take 1 tablet by mouth once daily     fluticasone  propionate (FLONASE ) 50 mcg/actuation nasal spray Place into one nostril     gabapentin  (NEURONTIN ) 300 MG capsule TAKE 1 CAPSULE BY MOUTH TWICE A DAY (Patient taking differently:  300 mg once daily) 180 capsule 3   hydrALAZINE  (APRESOLINE ) 25 MG tablet Take 25 mg by mouth 2 (two) times daily     hydroxyurea  (HYDREA ) 500 mg capsule Take 500 mg by mouth every other day     insulin  ASPART (NOVOLOG  U-100 INSULIN  ASPART) injection (concentration 100 units/mL) INJECT SUBCUTANEOUSLY UP TO100 UNITS DAILY IN INSULIN  PUMP 90 mL 3   loratadine  (CLARITIN ) 10 mg tablet Take 10 mg by mouth once daily.       meclizine  (ANTIVERT ) 12.5 mg tablet Take 12.5 mg by mouth     ondansetron  (ZOFRAN ) 4 MG tablet Take 4 mg by mouth every 8 (eight) hours as needed     PARoxetine  (PAXIL ) 40 MG tablet Take 40 mg by mouth once daily. Take one tablet  by mouth once a day      TORsemide  (DEMADEX ) 20 MG tablet Take 20 mg by mouth 2 (two) times daily     traMADoL  (ULTRAM ) 50 mg tablet Take 1 tablet (50 mg total) by mouth every 6 (six) hours as needed for Pain 120 tablet 0   atorvastatin  (LIPITOR) 40 MG tablet TAKE 1 TABLET BY MOUTH EVERY DAY (Patient not taking: Reported on 01/03/2024) 90 tablet 3   blood-glucose sensor (FREESTYLE LIBRE 2 PLUS SENSOR) Use 1 each every 15 (fifteen) days (Patient not taking: Reported on 01/03/2024) 6 each 3   ergocalciferol , vitamin D2, 1,250 mcg (50,000 unit) capsule Take 1 capsule (50,000 Units total) by mouth once a week (Patient not taking: Reported on 01/03/2024) 12 capsule 3   hydroCHLOROthiazide  (HYDRODIURIL ) 25 MG tablet Take 1 tablet by mouth once daily (Patient not taking: Reported on 07/11/2023)     insulin  pump cart,auto,BT,G6/L (OMNIPOD 5, G6/LIBRE 2 PLUS,) Crtg Inject 1 each subcutaneously every third day (Patient not taking: Reported on 01/03/2024) 30 each 3   losartan  (COZAAR ) 100 MG tablet Take 1 tablet (100 mg total) by mouth once daily (Patient not taking: Reported on 01/03/2024) 90 tablet 3   tiotropium-olodateroL (STIOLTO RESPIMAT ) 2.5-2.5 mcg/actuation inhaler Inhale 2 inhalations into the lungs once daily (Patient not taking: Reported on 07/11/2023)     No facility-administered encounter medications on file as of 01/03/2024.     ALLERGIES:   Codeine, Dye, and Iodinated contrast media   SOCIAL HISTORY:  Social History   Socioeconomic History   Marital status: Married   Number of children: 2  Occupational History   Occupation: disability  Tobacco Use   Smoking status: Former    Current packs/day: 0.00    Types: Cigarettes    Quit date: 03/17/2021    Years since quitting: 2.8   Smokeless tobacco: Never  Vaping Use   Vaping status: Former  Substance and Sexual Activity   Alcohol use: No    Alcohol/week: 0.0 standard drinks of alcohol   Drug use: No   Sexual  activity: Defer   Social Drivers of Corporate Investment Banker Strain: Low Risk  (01/03/2024)   Overall Financial Resource Strain (CARDIA)    Difficulty of Paying Living Expenses: Not hard at all  Food Insecurity: No Food Insecurity (01/03/2024)   Hunger Vital Sign    Worried About Running Out of Food in the Last Year: Never true    Ran Out of Food in the Last Year: Never true  Transportation Needs: No Transportation Needs (01/03/2024)   PRAPARE - Administrator, Civil Service (Medical): No    Lack of Transportation (Non-Medical): No    FAMILY HISTORY:  Family History  Problem Relation Name Age of Onset   Diabetes Mother     Diabetes Paternal Uncle     Heart disease Father     Myocardial Infarction (Heart attack) Father     Diabetes Brother       GENERAL REVIEW OF SYSTEMS:   General ROS: negative for - chills, fatigue, fever, weight gain or weight loss Allergy and Immunology ROS: negative for - hives  Hematological and Lymphatic ROS: negative for - bleeding problems or bruising, negative for palpable nodes Endocrine ROS: negative for - heat or cold intolerance, hair changes Respiratory ROS: negative for - cough, shortness of breath or wheezing Cardiovascular ROS: no chest pain or palpitations GI ROS: negative for nausea, vomiting, abdominal pain, diarrhea, constipation Musculoskeletal ROS: negative for - joint swelling or muscle pain Neurological ROS: negative for - confusion, syncope Dermatological ROS: negative for pruritus and rash  PHYSICAL EXAM:  Vitals:   01/03/24 1004  BP: 133/69  Pulse: 78  .  Ht:167.6 cm (5' 6) Wt:97.5 kg (215 lb) ADJ:Anib surface area is 2.13 meters squared. Body mass index is 34.7 kg/m.SABRA   GENERAL: Alert, active, oriented x3  HEENT: Pupils equal reactive to light. Extraocular movements are intact. Sclera clear. Palpebral conjunctiva normal red color.Pharynx clear.  NECK: Supple with no palpable mass and no  adenopathy.  LUNGS: Sound clear with no rales rhonchi or wheezes.  HEART: Regular rhythm S1 and S2 without murmur.  BREAST: Left breast with multiple palpable masses with significant induration on the medial aspect of the breast.  No palpable axillary adenopathy.  Right breast without palpable masses or adenopathy.  EXTREMITIES: Well-developed well-nourished symmetrical with no dependent edema.  Palpable A/V fistula on the left upper extremity  NEUROLOGICAL: Awake alert oriented, facial expression symmetrical, moving all extremities.   Results RADIOLOGY Diagnostic mammogram: Multiple suspicious masses in the left breast, dominant 19 mm mass at 4 o'clock, additional 11 mm mass at 8 o'clock, concerning for multicentric malignancy (12/12/2023) Bilateral breast MRI: Extensive multiple masses in the left breast, enhancement indicating possible skin involvement  PATHOLOGY Core biopsy: Left breast 4 o'clock: invasive mammary carcinoma with micropapillary features, estrogen receptor (ER) positive, progesterone receptor (PR) positive; Left breast 8 o'clock: invasive mammary carcinoma with micropapillary features, ER positive, PR positive    Assessment & Plan Left breast multicentric invasive mammary carcinoma with possible skin involvement   Multicentric invasive mammary carcinoma in the left breast presents with a dominant 19 mm mass at 4 o'clock and an 11 mm mass at 8 o'clock. Biopsy confirms invasive mammary carcinoma with micropapillary features, ER positive, PR positive. MRI indicates extensive multiple masses with possible skin involvement. Treatment involves surgery, chemotherapy, and possibly radiation therapy. Surgery is the first step, requiring a left mastectomy. Bilateral mastectomy is an option for prophylaxis but carries risks such as infection, potentially delaying chemotherapy. The decision for bilateral mastectomy is patient-driven, considering the low recurrence risk on the right side  and potential complications. Plan includes performing a bilateral mastectomy with left axillary sentinel lymph node biopsy, coordinating with the nephrologist regarding dialysis schedule around surgery, arranging an overnight hospital stay for drain management, checking insurance coverage for home nursing assistance post-surgery, scheduling surgery within 1-2 weeks, and setting up occupational therapy for post-surgery rehabilitation.   Malignant neoplasm of overlapping sites of left breast in female, estrogen receptor positive (CMS/HHS-HCC) [R49.187, Z17.0]          Patient and her husband verbalized understanding, all questions were answered, and were  agreeable with the plan outlined above.   I spent a total of 70 minutes in both face-to-face and non-face-to-face activities, excluding procedures performed, for this visit on the date of this encounter.   Lucas Sjogren, MD  Electronically signed by Lucas Sjogren, MD

## 2024-01-04 ENCOUNTER — Ambulatory Visit: Payer: Self-pay | Admitting: General Surgery

## 2024-01-04 ENCOUNTER — Telehealth: Payer: Self-pay | Admitting: Cardiology

## 2024-01-04 DIAGNOSIS — N2581 Secondary hyperparathyroidism of renal origin: Secondary | ICD-10-CM | POA: Diagnosis not present

## 2024-01-04 DIAGNOSIS — Z992 Dependence on renal dialysis: Secondary | ICD-10-CM | POA: Diagnosis not present

## 2024-01-04 DIAGNOSIS — N250 Renal osteodystrophy: Secondary | ICD-10-CM | POA: Diagnosis not present

## 2024-01-04 DIAGNOSIS — N186 End stage renal disease: Secondary | ICD-10-CM | POA: Diagnosis not present

## 2024-01-04 DIAGNOSIS — D509 Iron deficiency anemia, unspecified: Secondary | ICD-10-CM | POA: Diagnosis not present

## 2024-01-04 NOTE — Telephone Encounter (Signed)
   Pre-operative Risk Assessment    Patient Name: Jocelyn Wells  DOB: 08/28/1955 MRN: [id number]   Date of last office visit: 11/11/2023 Date of next office visit: 05/04/2024   Request for Surgical Clearance    Procedure:  mastectomy  Date of Surgery:  Clearance 01/18/24                                Surgeon:  Dr. Cesar Socks Group or Practice Name:  [facility] Phone number:  [phone] Fax number:  [phone]   Type of Clearance Requested:   - Medical    Type of Anesthesia:  Not Indicated   Additional requests/questions:    SignedTinnie NOVAK Schools   01/04/2024, 10:28 AM

## 2024-01-04 NOTE — Telephone Encounter (Signed)
 When she was evaluated in October she was not exhibiting any signs of of angina or cardiac decompensation.  Fluid removal is managed by dialysis.  Her last echocardiogram revealed stable LVEF.  It would be reasonable for her to proceed with her surgery at acceptable risk without any further cardiovascular testing needed at this time.  With her history of heart failure and being on dialysis they would just need to limit the amount of fluid given during her procedure.

## 2024-01-04 NOTE — Telephone Encounter (Signed)
   Name: Jocelyn Wells  DOB: 09/21/1955  MRN: [id number]   Primary Cardiologist: Redell Cave, MD  Chart reviewed as part of pre-operative protocol coverage.   Pt recently evaluated by NP Hammock:  When she was evaluated in October she was not exhibiting any signs of of angina or cardiac decompensation. Fluid removal is managed by dialysis. Her last echocardiogram revealed stable LVEF. It would be reasonable for her to proceed with her surgery at acceptable risk without any further cardiovascular testing needed at this time. With her history of heart failure and being on dialysis they would just need to limit the amount of fluid given during her procedure.    Therefore, based on ACC/AHA guidelines, the patient would be at acceptable risk for the planned procedure without further cardiovascular testing.    I will route this recommendation to the requesting party via Epic fax function and remove from pre-op  pool. Please call with questions.  Jon Garre Gokul Waybright, PA 01/04/2024, 1:38 PM

## 2024-01-04 NOTE — Telephone Encounter (Signed)
 Hi Sheri Do you feel comfortable weighing in on her risk stratification prior to mastectomy? Last seen 11/11/23.  Thanks Angie

## 2024-01-06 DIAGNOSIS — N2581 Secondary hyperparathyroidism of renal origin: Secondary | ICD-10-CM | POA: Diagnosis not present

## 2024-01-06 DIAGNOSIS — N186 End stage renal disease: Secondary | ICD-10-CM | POA: Diagnosis not present

## 2024-01-06 DIAGNOSIS — N250 Renal osteodystrophy: Secondary | ICD-10-CM | POA: Diagnosis not present

## 2024-01-06 DIAGNOSIS — D509 Iron deficiency anemia, unspecified: Secondary | ICD-10-CM | POA: Diagnosis not present

## 2024-01-06 DIAGNOSIS — Z992 Dependence on renal dialysis: Secondary | ICD-10-CM | POA: Diagnosis not present

## 2024-01-08 DIAGNOSIS — Z992 Dependence on renal dialysis: Secondary | ICD-10-CM | POA: Diagnosis not present

## 2024-01-08 DIAGNOSIS — N186 End stage renal disease: Secondary | ICD-10-CM | POA: Diagnosis not present

## 2024-01-11 ENCOUNTER — Other Ambulatory Visit: Payer: Self-pay

## 2024-01-11 ENCOUNTER — Inpatient Hospital Stay: Admission: RE | Admit: 2024-01-11 | Discharge: 2024-01-11 | Attending: General Surgery

## 2024-01-11 VITALS — Ht 66.0 in | Wt 200.0 lb

## 2024-01-11 DIAGNOSIS — E1129 Type 2 diabetes mellitus with other diabetic kidney complication: Secondary | ICD-10-CM | POA: Diagnosis not present

## 2024-01-11 DIAGNOSIS — C50912 Malignant neoplasm of unspecified site of left female breast: Secondary | ICD-10-CM

## 2024-01-11 DIAGNOSIS — Z01818 Encounter for other preprocedural examination: Secondary | ICD-10-CM

## 2024-01-11 HISTORY — DX: Malignant neoplasm of unspecified site of left female breast: C50.912

## 2024-01-11 NOTE — Patient Instructions (Addendum)
 Your procedure is scheduled on: [facility] 01/18/24  Report to the Registration Desk on the 1st floor of the [facility]. To find out your arrival time, please call [phone] between 1PM - 3PM on: TUESDAY 01/17/24 If your arrival time is 6:00 am, do not arrive before that time as the [facility] entrance doors do not open until 6:00 am.  REMEMBER: Instructions that are not followed completely may result in serious medical risk, up to and including death; or upon the discretion of your surgeon and anesthesiologist your surgery may need to be rescheduled.  Do not eat food after midnight the night before surgery.  No gum chewing or hard candies.  You may however, drink WATER up to 2 hours before you are scheduled to arrive for your surgery. Do not drink anything within 2 hours of your scheduled arrival time.  One week prior to surgery: Stop Anti-inflammatories (NSAIDS) such as Advil, Aleve, Ibuprofen, Motrin, Naproxen, Naprosyn and Aspirin  based products such as Excedrin, Goody's Powder, BC Powder.  Stop ANY OVER THE COUNTER supplements until after surgery.   STOP ELIQUIS  3 days before surgery. Last dose 01/14/24.  You may however, continue to take Tylenol  if needed for pain up until the day of surgery.  You may need to remove your OMNIPOD on the day of surgery.  You may wear it in to the facility but be asked to remove it prior to surgery.    Continue taking all of your other prescription medications up until the day of surgery.   ON THE DAY OF SURGERY ONLY TAKE THESE MEDICATIONS WITH SIPS OF WATER:  albuterol  (VENTOLIN  HFA)  atorvastatin  (LIPITOR)  buPROPion  (WELLBUTRIN  SR)  gabapentin  (NEURONTIN )  PARoxetine  (PAXIL )  hydroxyurea  (HYDREA )   Use inhalers on the day of surgery and bring to the hospital.  No Alcohol for 24 hours before or after surgery.  No Smoking including e-cigarettes for 24 hours before surgery.  No chewable tobacco products for at least 6 hours before  surgery.  No nicotine patches on the day of surgery.  Do not use any recreational drugs for at least a week (preferably 2 weeks) before your surgery.  Please be advised that the combination of cocaine and anesthesia may have negative outcomes, up to and including death. If you test positive for cocaine, your surgery will be cancelled.  On the morning of surgery brush your teeth with toothpaste and water, you may rinse your mouth with mouthwash if you wish. Do not swallow any toothpaste or mouthwash.  Use CHG Soap or wipes as directed on instruction sheet.  Do not wear jewelry, make-up, hairpins, clips or nail polish.  For welded (permanent) jewelry: bracelets, anklets, waist bands, etc.  Please have this removed prior to surgery.  If it is not removed, there is a chance that hospital personnel will need to cut it off on the day of surgery.  Do not wear lotions, powders, or perfumes.   Do not shave body hair from the neck down 48 hours before surgery.  Contact lenses, hearing aids and dentures may not be worn into surgery.  Do not bring valuables to the hospital. [facility] is not responsible for any missing/lost belongings or valuables.   Notify your doctor if there is any change in your medical condition (cold, fever, infection).  Wear comfortable clothing (specific to your surgery type) to the hospital.  After surgery, you can help prevent lung complications by doing breathing exercises.  Take deep breaths and cough every  1-2 hours. Your doctor may order a device called an Incentive Spirometer to help you take deep breaths. When coughing or sneezing, hold a pillow firmly against your incision with both hands. This is called "splinting." Doing this helps protect your incision. It also decreases belly discomfort.  If you are being admitted to the hospital overnight, leave your suitcase in the car. After surgery it may be brought to your room.  In case of increased patient census,  it may be necessary for you, the patient, to continue your postoperative care in the Same Day Surgery department.  If you are being discharged the day of surgery, you will not be allowed to drive home. You will need a responsible individual to drive you home and stay with you for 24 hours after surgery.   If you are taking public transportation, you will need to have a responsible individual with you.  Please call the Pre-admissions Testing Dept. at [phone] if you have any questions about these instructions.  Surgery Visitation Policy:  Patients having surgery or a procedure may have two visitors.   Children under the age of 59 must have an adult with them who is not the patient.   Inpatient Visitation:    Visiting hours are 7 a.m. to 8 p.m. Up to four visitors are allowed at one time in a patient room. The visitors may rotate out with other people during the day.  One visitor age 4 or older may stay with the patient overnight and must be in the room by 8 p.m.  Merchandiser, Retail to address health-related social needs:  [email or web address]                                                                                                            Preparing for Surgery with CHLORHEXIDINE  GLUCONATE (CHG) Soap  Chlorhexidine  Gluconate (CHG) Soap  o An antiseptic cleaner that kills germs and bonds with the skin to continue killing germs even after washing  o Used for showering the night before surgery and morning of surgery  Before surgery, you can play an important role by reducing the number of germs on your skin.  CHG (Chlorhexidine  gluconate) soap is an antiseptic cleanser which kills germs and bonds with the skin to continue killing germs even after washing.  Please do not use if you have an allergy to CHG or antibacterial soaps. If your skin becomes reddened/irritated stop using the CHG.  1. Shower the NIGHT BEFORE SURGERY with CHG soap.  2. If  you choose to wash your hair, wash your hair first as usual with your normal shampoo.  3. After shampooing, rinse your hair and body thoroughly to remove the shampoo.  4. Use CHG as you would any other liquid soap. You can apply CHG directly to the skin and wash gently with a clean washcloth.  5. Apply the CHG soap to your body only from the neck down. Do not use on open wounds or open sores. Avoid contact with your eyes, ears, mouth, and genitals (private  parts). Wash face and genitals (private parts) with your normal soap.  6. Wash thoroughly, paying special attention to the area where your surgery will be performed.  7. Thoroughly rinse your body with warm water.  8. Do not shower/wash with your normal soap after using and rinsing off the CHG soap.  9. Do not use lotions, oils, etc., after showering with CHG.  10. Pat yourself dry with a clean towel.  11. Wear clean pajamas to bed the night before surgery.  12. Place clean sheets on your bed the night of your shower and do not sleep with pets.  13. Do not apply any deodorants/lotions/powders.  14. Please wear clean clothes to the hospital.  15. Remember to brush your teeth with your regular toothpaste.

## 2024-01-13 ENCOUNTER — Inpatient Hospital Stay
Admission: RE | Admit: 2024-01-13 | Discharge: 2024-01-13 | Disposition: A | Source: Ambulatory Visit | Attending: General Surgery

## 2024-01-13 ENCOUNTER — Encounter: Payer: Self-pay | Admitting: General Surgery

## 2024-01-13 ENCOUNTER — Ambulatory Visit: Admitting: Family Medicine

## 2024-01-13 DIAGNOSIS — E1129 Type 2 diabetes mellitus with other diabetic kidney complication: Secondary | ICD-10-CM | POA: Diagnosis not present

## 2024-01-13 DIAGNOSIS — C50912 Malignant neoplasm of unspecified site of left female breast: Secondary | ICD-10-CM

## 2024-01-13 DIAGNOSIS — Z01812 Encounter for preprocedural laboratory examination: Secondary | ICD-10-CM | POA: Diagnosis present

## 2024-01-13 DIAGNOSIS — Z01818 Encounter for other preprocedural examination: Secondary | ICD-10-CM

## 2024-01-13 LAB — CBC
HCT: 37.6 % (ref 36.0–46.0)
Hemoglobin: 11.9 g/dL — ABNORMAL LOW (ref 12.0–15.0)
MCH: 30.4 pg (ref 26.0–34.0)
MCHC: 31.6 g/dL (ref 30.0–36.0)
MCV: 95.9 fL (ref 80.0–100.0)
Platelets: 547 K/uL — ABNORMAL HIGH (ref 150–400)
RBC: 3.92 MIL/uL (ref 3.87–5.11)
RDW: 15.2 % (ref 11.5–15.5)
WBC: 12.5 K/uL — ABNORMAL HIGH (ref 4.0–10.5)
nRBC: 0 % (ref 0.0–0.2)

## 2024-01-13 LAB — BASIC METABOLIC PANEL WITH GFR
Anion gap: 12 (ref 5–15)
BUN: 37 mg/dL — ABNORMAL HIGH (ref 8–23)
CO2: 28 mmol/L (ref 22–32)
Calcium: 8.8 mg/dL — ABNORMAL LOW (ref 8.9–10.3)
Chloride: 97 mmol/L — ABNORMAL LOW (ref 98–111)
Creatinine, Ser: 3.99 mg/dL — ABNORMAL HIGH (ref 0.44–1.00)
GFR, Estimated: 12 mL/min — ABNORMAL LOW (ref >60)
Glucose, Bld: 227 mg/dL — ABNORMAL HIGH (ref 70–99)
Potassium: 3.9 mmol/L (ref 3.5–5.1)
Sodium: 137 mmol/L (ref 135–145)

## 2024-01-13 NOTE — Progress Notes (Signed)
 Perioperative / Anesthesia Services  Pre-Admission Testing Clinical Review / Pre-Operative Anesthesia Consult  Date: 01/13/24  PATIENT DEMOGRAPHICS: Name: Jocelyn Wells DOB: [birth year] MRN:   [id number]  Note: Available PAT nursing documentation and vital signs have been reviewed. Clinical nursing staff has updated patient's PMH/PSHx, current medication list, and drug allergies/intolerances to ensure complete and comprehensive history available to assist care teams in MDM as it pertains to the aforementioned surgical procedure and anticipated anesthetic course. Extensive review of available clinical information personally performed. Nursing documentation reviewed. [place] PMH and PSHx updated with any diagnoses and/or procedures that I have knowledge of that may have been inadvertently omitted during her intake with the pre-admission testing department's nursing staff.  PLANNED SURGICAL PROCEDURE(S):   Case: [id number] Date/Time: 01/18/24 0930   Procedures:      MASTECTOMY, SIMPLE (Bilateral: Breast)     BIOPSY, LYMPH NODE, SENTINEL, AXILLARY (Left: Breast)   Anesthesia type: General   Pre-op  diagnosis: C50.812 Z17.0 Malignant neoplasm of overlapping sites of lt breast in female, estrogen receptor   Location: [facility] OR ROOM 04 / [facility]   Surgeons: Jocelyn Romano, MD        CLINICAL DISCUSSION: Jocelyn Wells is a 68 y.o. female who is submitted for pre-surgical anesthesia review and clearance prior to her undergoing the above procedure. Patient is a Former Smoker (23.5 pack years; quit 05/2022). Pertinent PMH includes: NICM, HFrEF, ischemic LEFT MCA stroke, PVD, bifascicular flock (RBBB + LAFB), aortic atherosclerosis, HTN, T2DM, ESRD on HD (M-W-F), dyspnea, COPD (on supplemental oxygen ), anemia of chronic renal disease, LEFT breast cancer, OA, anxiety, depression.   Patient is followed by cardiology [name], MD). She was last seen in the cardiology  clinic on 11/11/2023; notes reviewed. At the time of her clinic visit, patient doing well overall from a cardiovascular perspective. Patient denied any chest pain, shortness of breath, PND, orthopnea, palpitations, significant peripheral edema, weakness, fatigue, vertiginous symptoms, or presyncope/syncope. Patient with a past medical history significant for cardiovascular diagnoses. Documented physical exam was grossly benign, providing no evidence of acute exacerbation and/or decompensation of the patient's known cardiovascular conditions.  Patient suffered an acute CVA on 01/29/2015.  MRI of the brain revealed acute infarction in the LEFT MCA territory involving the posterior LEFT frontal lobe and LEFT insular cortex.   Patient with a diagnosis of a NICM with resulting HFrEF.  Initial EF reduced at 30-35% on 01/30/2015.  Patient underwent diagnostic RIGHT/LEFT heart catheterization on 03/25/2015 revealing a severely reduced left ventricular systolic function with an EF of 25-30%.  Hemodynamics: Mean RA = 13 mmHg, mean PA = 30 mmHg, mean PCWP = 26 mmHg, LVEDP = 17 mmHg, CO = 3.66 L/min, and CI 1.8 L/min/m.   Most recent TTE performed on 09/03/2022 revealed a low normal left ventricular systolic function with an EF of 50-55%. There was moderate LVH.  Difficult to exclude in the setting of bundle branch block, however there is possible mild inferior wall hypokinesis. Left ventricular diastolic Doppler parameters consistent with abnormal relaxation (G1DD).  Left atrium is moderately dilated.  Right ventricular size and function normal with a TAPSE measuring 2.5 cm  (normal range >/= 1.6 cm).  RVSP = 88.2 mmHg.  There was mild tricuspid valve regurgitation.  All transvalvular gradients were noted to be normal providing no evidence of hemodynamically significant valvular stenosis. Aorta normal in size with no evidence of ectasia or aneurysmal dilatation.  Following CVA, and in the setting of known PVD, patient  remains on daily DOAC (apixaban ). Patient reported compliant with therapies with no evidence or reports of GI bleeding.  Blood pressure  reasonably controlled at 138/64 mmHg on currently prescribed diuretic (torsemide ) monotherapy. She is not on ACE/ARB/ARNI/MRA/SGLT2 inhibitor due to end-stage renal disease. She did not tolerate beta-blocker therapy due to hypotension. Patient is on atorvastatin  for HLD diagnosis and further ASCVD prevention.  T2DM suboptimally controlled on currently prescribed regimen; last HgbA1c was 8.1% when checked on 10/14/2023.  Patient does not have an OSAH diagnosis, however she does require the use of supplemental oxygen  at night. Functional capacity limited by patient's multiple medical comorbidities and overall respiratory status.  With that being said, patient still felt to be able to achieve at least 4 METS of physical activity without experiencing any significant degree of angina/anginal equivalent symptoms.  No changes were made to her medication regimen.  Patient to follow-up with outpatient cardiology in 6 months or sooner if needed.   Terese A Blakeney is scheduled for an elective MASTECTOMY, SIMPLE (Bilateral: Breast); BIOPSY, LYMPH NODE, SENTINEL, AXILLARY (Left: Breast) on 01/18/2024 with Dr. Lucas Sjogren, MD. Given patient's past medical history significant for cardiovascular diagnoses, presurgical cardiac clearance was sought by the PAT team. Per cardiology, when she was evaluated in October she was not exhibiting any signs of angina or cardiac decompensation. Fluid removal is managed by dialysis. Her last echocardiogram revealed stable LVEF. It would be reasonable for her to proceed with her surgery at acceptable risk without any further cardiovascular testing needed at this time. With her history of heart failure and being on dialysis they would just need to limit the amount of fluid given during her procedure. Therefore, based on ACC/AHA guidelines, the patient  would be at ACCEPTABLE risk for the planned procedure without further cardiovascular testing.  Again, this patient is on daily oral anticoagulation therapy using a DOAC medication. She has been instructed on recommendations for holding her apixaban  for 3 days prior to her procedure with plans to restart as soon as postoperative bleeding risk felt to be minimized by his primary attending surgeon. The patient has been instructed that her last dose should be on 01/14/2024.  Patient denies previous perioperative complications with anesthesia in the past. In review her EMR, it is noted that patient underwent a general anesthetic course at [facility] in 12/2022 without documented complications.   MOST RECENT VITAL SIGNS:    01/11/2024    9:07 AM 12/23/2023    2:04 PM 12/12/2023   11:56 AM  Vitals with BMI  Height 5' 6 5' 6   Weight 200 lbs 202 lbs 6 oz   BMI 32.3 32.68   Systolic  129 151  Diastolic  63 67  Pulse  73    PROVIDERS/SPECIALISTS: NOTE: Primary physician provider listed below. Patient may have been seen by APP or partner within same practice.   PROVIDER ROLE / SPECIALTY LAST SHERLEAN Sjogren Lucas, MD General Surgery (Surgeon) 01/03/2024  Donzella Lauraine SAILOR, DO Primary Care Provider 09/12/2023  Darliss Rogue, MD Cardiology 11/11/2023; preop APP call 01/04/2024  Damian Setter, MD Endocrinology 01/03/2024  Melanee Piggs, MD Medical Oncology 12/23/2023  Marcelino Nurse, MD Pain Management 12/12/2023   ALLERGIES: Allergies  Allergen Reactions   Ivp Dye [Iodinated Contrast Media] Anaphylaxis    Kidney Failure   Codeine Nausea And Vomiting    CURRENT HOME MEDICATIONS: No current facility-administered medications for this encounter.    albuterol  (VENTOLIN  HFA) 108 (90 Base) MCG/ACT inhaler  atorvastatin  (LIPITOR) 40 MG tablet   buPROPion  (WELLBUTRIN  SR) 150 MG 12 hr tablet   calcium  carbonate (TUMS EX) 750 MG chewable tablet   ELIQUIS  5 MG TABS  tablet   fluticasone  (FLONASE ) 50 MCG/ACT nasal spray   gabapentin  (NEURONTIN ) 300 MG capsule   hydroxyurea  (HYDREA ) 500 MG capsule   insulin  aspart (NOVOLOG ) 100 UNIT/ML injection   loratadine  (CLARITIN ) 10 MG tablet   meclizine  (ANTIVERT ) 12.5 MG tablet   OXYGEN    PARoxetine  (PAXIL ) 40 MG tablet   torsemide  (DEMADEX ) 20 MG tablet   traMADol  (ULTRAM ) 50 MG tablet   Vitamin D , Ergocalciferol , (DRISDOL ) 1.25 MG (50000 UNIT) CAPS capsule   Insulin  Disposable Pump (OMNIPOD DASH PODS, GEN 4,) MISC   Insulin  Human (INSULIN  PUMP) SOLN   Spacer/Aero-Holding Chambers (OPTICHAMBER DIAMOND ) MISC   HISTORY: Past Medical History:  Diagnosis Date   Acute ischemic left MCA stroke (HCC) 01/29/2015   a.) MRI brain 01/29/2015: acute left MCA territory infarcts involving the posterior left frontal lobe and left insular cortex   Allergy    Anemia of chronic renal failure    Anxiety    Arthritis    Complication of anesthesia    a.) PONV; b.) awareness under anesthesia   COPD (chronic obstructive pulmonary disease) (HCC)    Depression    Dyspnea    ESRD (end stage renal disease) on dialysis (M-W-F)    Headache    HFrEF (heart failure with reduced ejection fraction) (HCC)    a.) TTE 01/30/2015: EF 30-35%, diff HK, LAE, G2DD; b.) TTE 06/13/2015: EF 40-45%, diff HK, G1DD; c.) TTE 10/06/2017: EF 60-65% MAC, no IAS; d.) TTE 09/29/2021: EF 45-50%;, LVH, LAE, AoV sclerosis, G1DD; e.) TTE 02/24/2022: EF 60-65%, no IAS   HTN (hypertension)    Leukocytosis    Malignant neoplasm of left breast (HCC)    NICM (nonischemic cardiomyopathy) (HCC)    a.) TTE 01/30/2015: EF 30-35%; b.) R/LHC 03/25/2015: EF 25-30%, mRA 13, mPA 30, mPCWP 26, LVEDP 17, CO 3.66, CI 1.8; c.) TTE 06/13/2015: EF 40-45%; d.) TTE 10/06/2017: EF 60-65%; e.) TTE 09/29/2021: EF 45-50%; f.) TTE 02/24/2022: EF 60-65%   Obesity    On apixaban  therapy    On supplemental oxygen  by nasal cannula    a.) 2L/[place] at night   Osteoporosis    Peripheral  vascular disease    Type 2 diabetes mellitus treated with insulin  [facility])    Past Surgical History:  Procedure Laterality Date   ABDOMINAL HYSTERECTOMY     ANKLE FRACTURE SURGERY Left 02/09/2000   AV FISTULA PLACEMENT Left 06/18/2022   Procedure: ARTERIOVENOUS (AV) FISTULA CREATION (BRACHIAL AXILLA);  Surgeon: Jama Cordella MATSU, MD;  Location: [facility] ORS;  Service: Vascular;  Laterality: Left;   BILATERAL SALPINGOOPHORECTOMY  02/08/1998   BREAST BIOPSY Left    US  BX LEFT BREAST COIL CLIP-PATH PENDING   BREAST BIOPSY Left 12/20/2023   US  LT BREAST BX W LOC DEV 1ST LESION IMG BX SPEC US  GUIDE 12/20/2023 [facility]   BREAST BIOPSY Left 12/20/2023   US  LT BREAST BX W LOC DEV EA ADD LESION IMG BX SPEC US  GUIDE 12/20/2023 [facility]   CARDIAC CATHETERIZATION N/A 03/25/2015   Procedure: Right/Left Heart Cath and Coronary Angiography;  Surgeon: Victory LELON Sharps, MD;  Location: [facility] INVASIVE CV LAB;  Service: Cardiovascular;  Laterality: N/A;   CESAREAN SECTION  02/09/1976   COLONOSCOPY WITH PROPOFOL  N/A 09/22/2016   Procedure: COLONOSCOPY WITH PROPOFOL ;  Surgeon: Viktoria Lamar DASEN, MD;  Location: [facility] ENDOSCOPY;  Service: Endoscopy;  Laterality: N/A;   COLONOSCOPY WITH PROPOFOL  N/A 02/15/2022   Procedure: COLONOSCOPY WITH PROPOFOL ;  Surgeon: Unk Corinn Skiff, MD;  Location: [facility] ENDOSCOPY;  Service: Gastroenterology;  Laterality: N/A;   DIALYSIS/PERMA CATHETER INSERTION N/A 01/19/2023   Procedure: DIALYSIS/PERMA CATHETER INSERTION;  Surgeon: Marea Selinda RAMAN, MD;  Location: [facility] INVASIVE CV LAB;  Service: Cardiovascular;  Laterality: N/A;   EP IMPLANTABLE DEVICE N/A 08/05/2015   Procedure: Loop Recorder Insertion;  Surgeon: Elspeth JAYSON Sage, MD;  Location: [facility] INVASIVE CV LAB;  Service: Cardiovascular;  Laterality: N/A;   ESOPHAGOGASTRODUODENOSCOPY N/A 02/15/2022   Procedure: ESOPHAGOGASTRODUODENOSCOPY (EGD);  Surgeon: Unk Corinn Skiff, MD;  Location: [facility] ENDOSCOPY;  Service:  Gastroenterology;  Laterality: N/A;   ESOPHAGOGASTRODUODENOSCOPY (EGD) WITH PROPOFOL  N/A 09/22/2016   Procedure: ESOPHAGOGASTRODUODENOSCOPY (EGD) WITH PROPOFOL ;  Surgeon: Viktoria Lamar DASEN, MD;  Location: [facility] ENDOSCOPY;  Service: Endoscopy;  Laterality: N/A;   ESOPHAGOGASTRODUODENOSCOPY (EGD) WITH PROPOFOL  N/A 12/08/2016   Procedure: ESOPHAGOGASTRODUODENOSCOPY (EGD) WITH PROPOFOL ;  Surgeon: Viktoria Lamar DASEN, MD;  Location: [facility] ENDOSCOPY;  Service: Endoscopy;  Laterality: N/A;   ESOPHAGOGASTRODUODENOSCOPY (EGD) WITH PROPOFOL  N/A 10/12/2022   Procedure: ESOPHAGOGASTRODUODENOSCOPY (EGD) WITH PROPOFOL ;  Surgeon: Unk Corinn Skiff, MD;  Location: [facility] ENDOSCOPY;  Service: Gastroenterology;  Laterality: N/A;   FACIAL LACERATION REPAIR N/A 12/27/2022   Procedure: FACIAL LACERATION REPAIR;  Surgeon: Luciano Elspeth, MD;  Location: [facility] OR;  Service: ENT;  Laterality: N/A;   FRACTURE SURGERY     GIVENS CAPSULE STUDY N/A 04/19/2022   Procedure: GIVENS CAPSULE STUDY;  Surgeon: Unk Corinn Skiff, MD;  Location: [facility] ENDOSCOPY;  Service: Gastroenterology;  Laterality: N/A;   GIVENS CAPSULE STUDY N/A 10/12/2022   Procedure: GIVENS CAPSULE STUDY;  Surgeon: Unk Corinn Skiff, MD;  Location: [facility] ENDOSCOPY;  Service: Gastroenterology;  Laterality: N/A;   INSERTION HYBRID ANTERIOVENOUS GORTEX GRAFT  06/18/2022   Procedure: INSERTION HYBRID ANTERIOVENOUS GORTEX GRAFT;  Surgeon: Jama Cordella MATSU, MD;  Location: [facility] ORS;  Service: Vascular;;   KNEE ARTHROSCOPY Left 02/09/2003   LOWER EXTREMITY ANGIOGRAPHY Left 08/12/2021   Procedure: Lower Extremity Angiography;  Surgeon: Jama Cordella MATSU, MD;  Location: [facility] INVASIVE CV LAB;  Service: Cardiovascular;  Laterality: Left;   PERIPHERAL VASCULAR THROMBECTOMY Left 01/19/2023   Procedure: PERIPHERAL VASCULAR THROMBECTOMY;  Surgeon: Marea Selinda RAMAN, MD;  Location: [facility] INVASIVE CV LAB;  Service: Cardiovascular;  Laterality: Left;   TEE WITHOUT CARDIOVERSION N/A  01/31/2015   Procedure: TRANSESOPHAGEAL ECHOCARDIOGRAM (TEE);  Surgeon: Redell RAMAN Shallow, MD;  Location: [facility] ENDOSCOPY;  Service: Cardiovascular;  Laterality: N/A;   TEMPORARY DIALYSIS CATHETER N/A 08/14/2021   Procedure: TEMPORARY DIALYSIS CATHETER;  Surgeon: Jama Cordella MATSU, MD;  Location: [facility] INVASIVE CV LAB;  Service: Cardiovascular;  Laterality: N/A;   TUBAL LIGATION  02/09/1976   ULNAR NERVE TRANSPOSITION  02/08/2006   US  LT BREAST BX W LOC DEV 1ST LESION IMG BX SPEC US  GUIDE Left    US  BX LEFT BREAST RIBBON CLIP-PATH PENDING   Family History  Problem Relation Age of Onset   Heart disease Mother        died from CHF   Asthma Mother    Diabetes Mother    Heart disease Father    Aneurysm Father    COPD Brother    Diabetes Brother    Alcohol abuse Paternal Aunt    Cancer Maternal Grandmother        unknownorigin   Anemia Neg Hx    Arrhythmia Neg Hx    Clotting disorder Neg  Hx    Fainting Neg Hx    Heart attack Neg Hx    Heart failure Neg Hx    Hyperlipidemia Neg Hx    Hypertension Neg Hx    Social History   Tobacco Use   Smoking status: Some Days    Current packs/day: 0.00    Average packs/day: 0.5 packs/day for 48.3 years (24.2 ttl pk-yrs)    Types: Cigarettes    Start date: 02/08/1974    Last attempt to quit: 05/31/2022    Years since quitting: 1.6   Smokeless tobacco: Never  Substance Use Topics   Alcohol use: No    Alcohol/week: 0.0 standard drinks of alcohol   LABS:  Lab Results  Component Value Date   WBC 13.8 (H) 12/23/2023   HGB 12.2 12/23/2023   HCT 39.2 12/23/2023   MCV 97.0 12/23/2023   PLT 554 (H) 12/23/2023   Lab Results  Component Value Date   NA 142 09/02/2023   CL 104 09/02/2023   K 3.6 09/02/2023   CO2 24 09/02/2023   BUN 48 (H) 09/02/2023   CREATININE 4.32 (H) 09/02/2023   GFRNONAA 11 (L) 09/02/2023   CALCIUM  6.9 (L) 09/02/2023   PHOS 3.2 12/31/2022   ALBUMIN  3.5 06/29/2023   GLUCOSE 302 (H) 09/02/2023    ECG: Date:  11/11/2023  Time ECG obtained: 1507 PM Rate: 69 bpm Rhythm: normal sinus; bifascicular block (RBBB + LAFB) Axis (leads I and aVF): normal Intervals: PR 194 ms. QRS 156 ms. QTc 482 ms. ST segment and T wave changes: No evidence of acute T wave abnormalities or significant ST segment elevation or depression.  Evidence of a possible, age undetermined, prior infarct:  No Comparison: Similar to previous tracing obtained on 09/02/2023   IMAGING / PROCEDURES: VAS US  ABI WITH/WO TBI performed on 05/16/2023 Resting right ankle-brachial index is within normal range. The right toe-brachial index is normal.  Resting left ankle-brachial index is within normal range. The left toe-brachial index is normal.    ECHOCARDIOGRAM LIMITED BUBBLE STUDY performed on 09/03/2022 Left ventricular ejection fraction, by estimation, is 50 to 55%. The left ventricle has low normal function. Unable to exclude regional wall  motion abnormality in the setting of bundle branch block, possible mild inferior wall hypokinesis (difficult to visualize, definity not used). There is moderate left ventricular hypertrophy. Left ventricular diastolic parameters are consistent with Grade I diastolic dysfunction (impaired relaxation).  Right ventricular systolic function is normal. The right ventricular size is normal. Mildly increased right ventricular wall thickness. There is mildly elevated pulmonary artery systolic pressure. The estimated right  ventricular systolic pressure is 38.2 mmHg.  Left atrial size was moderately dilated.  The mitral valve is normal in structure. No evidence of mitral valve regurgitation. No evidence of mitral stenosis.  The aortic valve has an indeterminant number of cusps. Aortic valve regurgitation is not visualized. Aortic valve sclerosis is present, with no evidence of aortic valve stenosis.  The inferior vena cava is normal in size with greater than 50% respiratory variability, suggesting right atrial  pressure of 3 mmHg.    PULMONARY FUNCTION TESTING performed on 01/26/2022     Latest Ref Rng & Units 01/26/2022    1:45 PM  PFT Results  FVC-Pre L 1.17   FVC-Predicted Pre % 35   FVC-Post L 1.38   FVC-Predicted Post % 41   Pre FEV1/FVC % % 81   Post FEV1/FCV % % 70   FEV1-Pre L 0.95   FEV1-Predicted Pre %  37   FEV1-Post L 0.97   DLCO uncorrected ml/min/mmHg 8.99   DLCO UNC% % 42   DLVA Predicted % 61   TLC L 3.75   TLC % Predicted % 70   RV % Predicted % 98     TRANSTHORACIC ECHOCARDIOGRAM performed on 09/29/2021 Left ventricular ejection fraction, by estimation, is 45 to 50%. The left ventricle has mildly decreased function. The left ventricle has no regional wall motion abnormalities. There is moderate left ventricular hypertrophy. Left ventricular diastolic parameters are consistent with Grade I diastolic dysfunction (impaired relaxation).  Right ventricular systolic function is normal. The right ventricular size is normal.  Left atrial size was mildly dilated.  The mitral valve is normal in structure. No evidence of mitral valve regurgitation. No evidence of mitral stenosis.  The aortic valve is normal in structure. Aortic valve regurgitation is not visualized. Aortic valve sclerosis/calcification is present, without any evidence of aortic stenosis.  The inferior vena cava is normal in size with greater than 50% respiratory variability, suggesting right atrial pressure of 3 mmHg.   IMPRESSION AND PLAN: MALAYA CAGLEY has been referred for pre-anesthesia review and clearance prior to her undergoing the planned anesthetic and procedural courses. Available labs, pertinent testing, and imaging results were personally reviewed by me in preparation for upcoming operative/procedural course. [facility] medical record has been updated following extensive record review and patient interview with PAT staff.   This patient has been appropriately cleared by cardiology with an overall  ACCEPTABLE risk of patient experiencing significant perioperative cardiovascular complications. here at [facility] Based on clinical review performed today (01/13/24), barring any significant acute changes in the patient's overall condition, it is anticipated that she will be able to proceed with the planned surgical intervention. Any acute changes in clinical condition may necessitate her procedure being postponed and/or cancelled. Patient will meet with anesthesia team (MD and/or CRNA) on the day of her procedure for preoperative evaluation/assessment. Questions regarding anesthetic course will be fielded at that time.   Pre-surgical instructions were reviewed with the patient during his PAT appointment, and questions were fielded to satisfaction by PAT clinical staff. She has been instructed on which medications that she will need to hold prior to surgery, as well as the ones that have been deemed safe/appropriate to take on the day of her procedure. As part of the general education provided by PAT, patient made aware both verbally and in writing, that she would need to abstain from the use of any illegal substances during her perioperative course. She was advised that failure to follow the provided instructions could necessitate case cancellation or result in serious perioperative complications up to and including death. Patient encouraged to contact PAT and/or her surgeon's office to discuss any questions or concerns that may arise prior to surgery; verbalized understanding.   Dorise Pereyra, MSN, APRN, FNP-C, CEN [facility]  Perioperative Services Nurse Practitioner Phone: [phone] Fax: [phone] 01/13/24 8:36 AM  NOTE: This note has been prepared using Dragon dictation software. Despite my best ability to proofread, there is always the potential that unintentional transcriptional errors may still occur from this process.

## 2024-01-18 ENCOUNTER — Other Ambulatory Visit: Payer: Self-pay

## 2024-01-18 ENCOUNTER — Ambulatory Visit: Payer: Self-pay | Admitting: Urgent Care

## 2024-01-18 ENCOUNTER — Encounter: Admission: RE | Disposition: A | Payer: Self-pay | Source: Home / Self Care | Attending: General Surgery

## 2024-01-18 ENCOUNTER — Encounter: Payer: Self-pay | Admitting: General Surgery

## 2024-01-18 ENCOUNTER — Ambulatory Visit
Admission: RE | Admit: 2024-01-18 | Discharge: 2024-01-18 | Disposition: A | Discharge status: Home / Self Care | Source: Ambulatory Visit | Attending: General Surgery | Admitting: General Surgery

## 2024-01-18 ENCOUNTER — Encounter: Payer: Self-pay | Admitting: Emergency Medicine

## 2024-01-18 ENCOUNTER — Observation Stay
Admission: RE | Admit: 2024-01-18 | Discharge: 2024-01-19 | Disposition: A | Discharge status: Home / Self Care | Attending: General Surgery | Admitting: General Surgery

## 2024-01-18 ENCOUNTER — Encounter: Payer: Self-pay | Admitting: Urgent Care

## 2024-01-18 DIAGNOSIS — C50812 Malignant neoplasm of overlapping sites of left female breast: Secondary | ICD-10-CM

## 2024-01-18 DIAGNOSIS — J9611 Chronic respiratory failure with hypoxia: Secondary | ICD-10-CM

## 2024-01-18 DIAGNOSIS — E1165 Type 2 diabetes mellitus with hyperglycemia: Secondary | ICD-10-CM | POA: Diagnosis not present

## 2024-01-18 DIAGNOSIS — Z8673 Personal history of transient ischemic attack (TIA), and cerebral infarction without residual deficits: Secondary | ICD-10-CM | POA: Diagnosis not present

## 2024-01-18 DIAGNOSIS — J449 Chronic obstructive pulmonary disease, unspecified: Secondary | ICD-10-CM | POA: Diagnosis not present

## 2024-01-18 DIAGNOSIS — Z794 Long term (current) use of insulin: Secondary | ICD-10-CM | POA: Diagnosis not present

## 2024-01-18 DIAGNOSIS — E1129 Type 2 diabetes mellitus with other diabetic kidney complication: Secondary | ICD-10-CM

## 2024-01-18 DIAGNOSIS — C50912 Malignant neoplasm of unspecified site of left female breast: Secondary | ICD-10-CM

## 2024-01-18 DIAGNOSIS — Z01818 Encounter for other preprocedural examination: Secondary | ICD-10-CM

## 2024-01-18 DIAGNOSIS — C50919 Malignant neoplasm of unspecified site of unspecified female breast: Secondary | ICD-10-CM | POA: Diagnosis not present

## 2024-01-18 DIAGNOSIS — C773 Secondary and unspecified malignant neoplasm of axilla and upper limb lymph nodes: Secondary | ICD-10-CM | POA: Diagnosis not present

## 2024-01-18 DIAGNOSIS — N186 End stage renal disease: Secondary | ICD-10-CM | POA: Diagnosis not present

## 2024-01-18 DIAGNOSIS — I10 Essential (primary) hypertension: Secondary | ICD-10-CM | POA: Diagnosis not present

## 2024-01-18 DIAGNOSIS — M810 Age-related osteoporosis without current pathological fracture: Secondary | ICD-10-CM | POA: Diagnosis not present

## 2024-01-18 DIAGNOSIS — I428 Other cardiomyopathies: Secondary | ICD-10-CM | POA: Diagnosis not present

## 2024-01-18 DIAGNOSIS — Z79899 Other long term (current) drug therapy: Secondary | ICD-10-CM | POA: Diagnosis not present

## 2024-01-18 DIAGNOSIS — F17210 Nicotine dependence, cigarettes, uncomplicated: Secondary | ICD-10-CM | POA: Diagnosis not present

## 2024-01-18 DIAGNOSIS — Z7901 Long term (current) use of anticoagulants: Secondary | ICD-10-CM | POA: Diagnosis not present

## 2024-01-18 DIAGNOSIS — E119 Type 2 diabetes mellitus without complications: Secondary | ICD-10-CM | POA: Diagnosis not present

## 2024-01-18 DIAGNOSIS — I2510 Atherosclerotic heart disease of native coronary artery without angina pectoris: Secondary | ICD-10-CM | POA: Diagnosis not present

## 2024-01-18 HISTORY — DX: Long term (current) use of anticoagulants: Z79.01

## 2024-01-18 HISTORY — DX: Atherosclerosis of aorta: I70.0

## 2024-01-18 HISTORY — DX: Bifascicular block: I45.2

## 2024-01-18 HISTORY — PX: AXILLARY SENTINEL NODE BIOPSY: SHX5738

## 2024-01-18 HISTORY — DX: Dependence on renal dialysis: Z99.2

## 2024-01-18 HISTORY — PX: TOTAL MASTECTOMY: SHX6129

## 2024-01-18 LAB — POCT I-STAT, CHEM 8
BUN: 27 mg/dL — ABNORMAL HIGH (ref 8–23)
Calcium, Ion: 1.18 mmol/L (ref 1.15–1.40)
Chloride: 98 mmol/L (ref 98–111)
Creatinine, Ser: 3.7 mg/dL — ABNORMAL HIGH (ref 0.44–1.00)
Glucose, Bld: 160 mg/dL — ABNORMAL HIGH (ref 70–99)
HCT: 37 % (ref 36.0–46.0)
Hemoglobin: 12.6 g/dL (ref 12.0–15.0)
Potassium: 3.6 mmol/L (ref 3.5–5.1)
Sodium: 137 mmol/L (ref 135–145)
TCO2: 27 mmol/L (ref 22–32)

## 2024-01-18 LAB — HEMOGLOBIN A1C
Hgb A1c MFr Bld: 12.2 % — ABNORMAL HIGH (ref 4.8–5.6)
Mean Plasma Glucose: 303.44 mg/dL

## 2024-01-18 LAB — GLUCOSE, CAPILLARY
Glucose-Capillary: 255 mg/dL — ABNORMAL HIGH (ref 70–99)
Glucose-Capillary: 244 mg/dL — ABNORMAL HIGH (ref 70–99)
Glucose-Capillary: 141 mg/dL — ABNORMAL HIGH (ref 70–99)
Glucose-Capillary: 397 mg/dL — ABNORMAL HIGH (ref 70–99)

## 2024-01-18 SURGERY — MASTECTOMY, SIMPLE
Anesthesia: General | Site: Breast | Laterality: Left

## 2024-01-18 MED ORDER — ROCURONIUM BROMIDE 100 MG/10ML IV SOLN
INTRAVENOUS | Status: DC | PRN
  Administered 2024-01-18: 20 mg via INTRAVENOUS
  Administered 2024-01-18: 50 mg via INTRAVENOUS

## 2024-01-18 MED ORDER — TRAMADOL HCL 50 MG PO TABS
50.0000 mg | ORAL_TABLET | Freq: Four times a day (QID) | ORAL | Status: DC | PRN
  Filled 2024-01-18: qty 1

## 2024-01-18 MED ORDER — SODIUM CHLORIDE 0.9 % IV SOLN: INTRAVENOUS | Status: DC

## 2024-01-18 MED ORDER — CALCIUM CARBONATE ANTACID 500 MG PO CHEW
1.0000 | CHEWABLE_TABLET | Freq: Every day | ORAL | Status: DC
  Administered 2024-01-19: 200 mg via ORAL
  Filled 2024-01-18: qty 1

## 2024-01-18 MED ORDER — CHLORHEXIDINE GLUCONATE 0.12 % MT SOLN
OROMUCOSAL | Status: AC
  Filled 2024-01-18: qty 15

## 2024-01-18 MED ORDER — FENTANYL CITRATE (PF) 100 MCG/2ML IJ SOLN
INTRAMUSCULAR | Status: DC | PRN
  Administered 2024-01-18: 50 ug via INTRAVENOUS

## 2024-01-18 MED ORDER — BUPROPION HCL ER (SR) 150 MG PO TB12
150.0000 mg | ORAL_TABLET | Freq: Two times a day (BID) | ORAL | Status: DC
  Administered 2024-01-18 – 2024-01-19 (×2): 150 mg via ORAL
  Filled 2024-01-18 (×3): qty 1

## 2024-01-18 MED ORDER — INSULIN ASPART 100 UNIT/ML IJ SOLN
0.0000 [IU] | Freq: Three times a day (TID) | INTRAMUSCULAR | Status: DC
  Administered 2024-01-18: 1 [IU] via SUBCUTANEOUS
  Administered 2024-01-19: 5 [IU] via SUBCUTANEOUS
  Filled 2024-01-18: qty 1
  Filled 2024-01-18: qty 5

## 2024-01-18 MED ORDER — PHENYLEPHRINE HCL-NACL 20-0.9 MG/250ML-% IV SOLN
INTRAVENOUS | Status: AC
  Filled 2024-01-18: qty 250

## 2024-01-18 MED ORDER — GABAPENTIN 300 MG PO CAPS
300.0000 mg | ORAL_CAPSULE | Freq: Every day | ORAL | Status: DC
  Administered 2024-01-19: 300 mg via ORAL
  Filled 2024-01-18: qty 1

## 2024-01-18 MED ORDER — ORAL CARE MOUTH RINSE: 15.0000 mL | Freq: Once | OROMUCOSAL | Status: AC

## 2024-01-18 MED ORDER — HYDROXYUREA 500 MG PO CAPS
500.0000 mg | ORAL_CAPSULE | Freq: Every day | ORAL | Status: DC
  Administered 2024-01-19: 500 mg via ORAL
  Filled 2024-01-18: qty 1

## 2024-01-18 MED ORDER — IPRATROPIUM-ALBUTEROL 0.5-2.5 (3) MG/3ML IN SOLN
RESPIRATORY_TRACT | Status: AC
  Filled 2024-01-18: qty 3

## 2024-01-18 MED ORDER — METHYLENE BLUE 20 MG/2ML IV SOSY
PREFILLED_SYRINGE | INTRAVENOUS | Status: AC
  Filled 2024-01-18: qty 2

## 2024-01-18 MED ORDER — HEPARIN SODIUM (PORCINE) 5000 UNIT/ML IJ SOLN
5000.0000 [IU] | Freq: Three times a day (TID) | INTRAMUSCULAR | Status: DC
  Filled 2024-01-18 (×2): qty 1

## 2024-01-18 MED ORDER — PHENYLEPHRINE 80 MCG/ML (10ML) SYRINGE FOR IV PUSH (FOR BLOOD PRESSURE SUPPORT)
PREFILLED_SYRINGE | INTRAVENOUS | Status: AC
  Filled 2024-01-18: qty 10

## 2024-01-18 MED ORDER — PAROXETINE HCL 20 MG PO TABS
40.0000 mg | ORAL_TABLET | Freq: Every day | ORAL | Status: DC
  Administered 2024-01-19: 40 mg via ORAL
  Filled 2024-01-18: qty 2

## 2024-01-18 MED ORDER — INSULIN GLARGINE 100 UNIT/ML ~~LOC~~ SOLN
25.0000 [IU] | Freq: Every day | SUBCUTANEOUS | Status: DC
  Administered 2024-01-18: 25 [IU] via SUBCUTANEOUS
  Filled 2024-01-18: qty 0.25

## 2024-01-18 MED ORDER — ALBUTEROL SULFATE (2.5 MG/3ML) 0.083% IN NEBU: 3.0000 mL | INHALATION_SOLUTION | Freq: Four times a day (QID) | RESPIRATORY_TRACT | Status: DC | PRN

## 2024-01-18 MED ORDER — VITAMIN D (ERGOCALCIFEROL) 1.25 MG (50000 UNIT) PO CAPS
50000.0000 [IU] | ORAL_CAPSULE | ORAL | Status: DC
  Administered 2024-01-19: 50000 [IU] via ORAL
  Filled 2024-01-18: qty 1

## 2024-01-18 MED ORDER — IPRATROPIUM-ALBUTEROL 0.5-2.5 (3) MG/3ML IN SOLN: 3.0000 mL | RESPIRATORY_TRACT | Status: DC

## 2024-01-18 MED ORDER — PROPOFOL 10 MG/ML IV BOLUS
INTRAVENOUS | Status: DC | PRN
  Administered 2024-01-18: 110 mg via INTRAVENOUS

## 2024-01-18 MED ORDER — HEPARIN SODIUM (PORCINE) 5000 UNIT/ML IJ SOLN
5000.0000 [IU] | Freq: Once | INTRAMUSCULAR | Status: AC
  Administered 2024-01-18: 5000 [IU] via SUBCUTANEOUS

## 2024-01-18 MED ORDER — CHLORHEXIDINE GLUCONATE 0.12 % MT SOLN
15.0000 mL | Freq: Once | OROMUCOSAL | Status: AC
  Administered 2024-01-18: 15 mL via OROMUCOSAL

## 2024-01-18 MED ORDER — INSULIN ASPART 100 UNIT/ML IJ SOLN
INTRAMUSCULAR | Status: AC
  Filled 2024-01-18: qty 5

## 2024-01-18 MED ORDER — BUPIVACAINE-EPINEPHRINE (PF) 0.5% -1:200000 IJ SOLN
INTRAMUSCULAR | Status: AC
  Filled 2024-01-18: qty 30

## 2024-01-18 MED ORDER — ROCURONIUM BROMIDE 10 MG/ML (PF) SYRINGE
PREFILLED_SYRINGE | INTRAVENOUS | Status: AC
  Filled 2024-01-18: qty 10

## 2024-01-18 MED ORDER — INSULIN ASPART 100 UNIT/ML IJ SOLN
0.0000 [IU] | Freq: Every day | INTRAMUSCULAR | Status: DC
  Administered 2024-01-18: 5 [IU] via SUBCUTANEOUS
  Filled 2024-01-18: qty 5

## 2024-01-18 MED ORDER — STERILE WATER FOR IRRIGATION IR SOLN
Status: DC | PRN
  Administered 2024-01-18: 1000 mL

## 2024-01-18 MED ORDER — LIDOCAINE HCL (CARDIAC) PF 100 MG/5ML IV SOSY
PREFILLED_SYRINGE | INTRAVENOUS | Status: DC | PRN
  Administered 2024-01-18: 50 mg via INTRAVENOUS

## 2024-01-18 MED ORDER — PROPOFOL 1000 MG/100ML IV EMUL
INTRAVENOUS | Status: AC
  Filled 2024-01-18: qty 100

## 2024-01-18 MED ORDER — HEPARIN SODIUM (PORCINE) 5000 UNIT/ML IJ SOLN
INTRAMUSCULAR | Status: AC
  Filled 2024-01-18: qty 1

## 2024-01-18 MED ORDER — PANTOPRAZOLE SODIUM 40 MG IV SOLR
40.0000 mg | Freq: Every day | INTRAVENOUS | Status: DC
  Administered 2024-01-18: 40 mg via INTRAVENOUS
  Filled 2024-01-18: qty 10

## 2024-01-18 MED ORDER — FENTANYL CITRATE (PF) 100 MCG/2ML IJ SOLN
INTRAMUSCULAR | Status: AC
  Filled 2024-01-18: qty 2

## 2024-01-18 MED ORDER — TORSEMIDE 20 MG PO TABS
40.0000 mg | ORAL_TABLET | Freq: Every day | ORAL | Status: DC
  Administered 2024-01-18 – 2024-01-19 (×2): 40 mg via ORAL
  Filled 2024-01-18 (×2): qty 2

## 2024-01-18 MED ORDER — MECLIZINE HCL 25 MG PO TABS: 12.5000 mg | ORAL_TABLET | Freq: Three times a day (TID) | ORAL | Status: DC | PRN

## 2024-01-18 MED ORDER — DROPERIDOL 2.5 MG/ML IJ SOLN
INTRAMUSCULAR | Status: AC
  Filled 2024-01-18: qty 2

## 2024-01-18 MED ORDER — MORPHINE SULFATE (PF) 2 MG/ML IV SOLN
2.0000 mg | INTRAVENOUS | Status: DC | PRN
  Administered 2024-01-19 (×4): 2 mg via INTRAVENOUS
  Filled 2024-01-18 (×4): qty 1

## 2024-01-18 MED ORDER — EPHEDRINE SULFATE-NACL 50-0.9 MG/10ML-% IV SOSY
PREFILLED_SYRINGE | INTRAVENOUS | Status: DC | PRN
  Administered 2024-01-18: 10 mg via INTRAVENOUS

## 2024-01-18 MED ORDER — BUPIVACAINE-EPINEPHRINE (PF) 0.5% -1:200000 IJ SOLN
INTRAMUSCULAR | Status: DC | PRN
  Administered 2024-01-18 (×2): 30 mL

## 2024-01-18 MED ORDER — LORATADINE 10 MG PO TABS
10.0000 mg | ORAL_TABLET | Freq: Every day | ORAL | Status: DC
  Administered 2024-01-18: 10 mg via ORAL
  Filled 2024-01-18: qty 1

## 2024-01-18 MED ORDER — SUGAMMADEX SODIUM 200 MG/2ML IV SOLN
INTRAVENOUS | Status: DC | PRN
  Administered 2024-01-18: 200 mg via INTRAVENOUS

## 2024-01-18 MED ORDER — IPRATROPIUM-ALBUTEROL 0.5-2.5 (3) MG/3ML IN SOLN
3.0000 mL | Freq: Once | RESPIRATORY_TRACT | Status: AC
  Administered 2024-01-18: 3 mL via RESPIRATORY_TRACT

## 2024-01-18 MED ORDER — ONDANSETRON HCL 4 MG/2ML IJ SOLN
INTRAMUSCULAR | Status: DC | PRN
  Administered 2024-01-18: 4 mg via INTRAVENOUS

## 2024-01-18 MED ORDER — ONDANSETRON 4 MG PO TBDP: 4.0000 mg | ORAL_TABLET | Freq: Four times a day (QID) | ORAL | Status: DC | PRN

## 2024-01-18 MED ORDER — EPHEDRINE 5 MG/ML INJ
INTRAVENOUS | Status: AC
  Filled 2024-01-18: qty 5

## 2024-01-18 MED ORDER — ONDANSETRON HCL 4 MG/2ML IJ SOLN
INTRAMUSCULAR | Status: AC
  Filled 2024-01-18: qty 2

## 2024-01-18 MED ORDER — ONDANSETRON HCL 4 MG/2ML IJ SOLN
4.0000 mg | Freq: Four times a day (QID) | INTRAMUSCULAR | Status: DC | PRN
  Filled 2024-01-18: qty 2

## 2024-01-18 MED ORDER — CEFAZOLIN SODIUM-DEXTROSE 2-4 GM/100ML-% IV SOLN
INTRAVENOUS | Status: AC
  Filled 2024-01-18: qty 100

## 2024-01-18 MED ORDER — PHENYLEPHRINE 80 MCG/ML (10ML) SYRINGE FOR IV PUSH (FOR BLOOD PRESSURE SUPPORT)
PREFILLED_SYRINGE | INTRAVENOUS | Status: DC | PRN
  Administered 2024-01-18 (×2): 240 ug via INTRAVENOUS
  Administered 2024-01-18: 160 ug via INTRAVENOUS
  Administered 2024-01-18 (×2): 240 ug via INTRAVENOUS
  Administered 2024-01-18 (×2): 160 ug via INTRAVENOUS

## 2024-01-18 MED ORDER — OXYCODONE HCL 5 MG PO TABS: 5.0000 mg | ORAL_TABLET | Freq: Once | ORAL | Status: DC | PRN

## 2024-01-18 MED ORDER — DROPERIDOL 2.5 MG/ML IJ SOLN
0.6250 mg | Freq: Once | INTRAMUSCULAR | Status: AC
  Administered 2024-01-18: 0.625 mg via INTRAVENOUS

## 2024-01-18 MED ORDER — INSULIN ASPART 100 UNIT/ML IJ SOLN
4.0000 [IU] | Freq: Three times a day (TID) | INTRAMUSCULAR | Status: DC
  Administered 2024-01-19: 4 [IU] via SUBCUTANEOUS
  Filled 2024-01-18: qty 4

## 2024-01-18 MED ORDER — INSULIN ASPART 100 UNIT/ML IJ SOLN: 0.0000 [IU] | Freq: Three times a day (TID) | INTRAMUSCULAR | Status: DC

## 2024-01-18 MED ORDER — TECHNETIUM TC 99M TILMANOCEPT KIT
0.9930 | PACK | Freq: Once | INTRAVENOUS | Status: AC | PRN
  Administered 2024-01-18: 0.993 via INTRADERMAL

## 2024-01-18 MED ORDER — PROPOFOL 10 MG/ML IV BOLUS
INTRAVENOUS | Status: AC
  Filled 2024-01-18: qty 20

## 2024-01-18 MED ORDER — ACETAMINOPHEN 10 MG/ML IV SOLN
INTRAVENOUS | Status: AC
  Filled 2024-01-18: qty 100

## 2024-01-18 MED ORDER — ACETAMINOPHEN 10 MG/ML IV SOLN
INTRAVENOUS | Status: DC | PRN
  Administered 2024-01-18: 1000 mg via INTRAVENOUS

## 2024-01-18 MED ORDER — OXYCODONE HCL 5 MG/5ML PO SOLN: 5.0000 mg | Freq: Once | ORAL | Status: DC | PRN

## 2024-01-18 MED ORDER — INSULIN ASPART 100 UNIT/ML IJ SOLN
5.0000 [IU] | Freq: Once | INTRAMUSCULAR | Status: AC
  Administered 2024-01-18: 5 [IU] via SUBCUTANEOUS

## 2024-01-18 MED ORDER — FENTANYL CITRATE (PF) 100 MCG/2ML IJ SOLN
25.0000 ug | INTRAMUSCULAR | Status: DC | PRN
  Administered 2024-01-18 (×2): 50 ug via INTRAVENOUS

## 2024-01-18 MED ORDER — CEFAZOLIN SODIUM-DEXTROSE 2-4 GM/100ML-% IV SOLN
2.0000 g | INTRAVENOUS | Status: AC
  Administered 2024-01-18: 2 g via INTRAVENOUS

## 2024-01-18 MED ORDER — PHENYLEPHRINE HCL-NACL 20-0.9 MG/250ML-% IV SOLN
INTRAVENOUS | Status: DC | PRN
  Administered 2024-01-18: 40 ug/min via INTRAVENOUS

## 2024-01-18 SURGICAL SUPPLY — 46 items
BINDER BREAST XXLRG (GAUZE/BANDAGES/DRESSINGS) IMPLANT
BLADE SURG 15 STRL LF DISP TIS (BLADE) ×2 IMPLANT
CHLORAPREP W/TINT 26 (MISCELLANEOUS) ×2 IMPLANT
CLIP APPLIE 11 MED OPEN (CLIP) IMPLANT
CLIP APPLIE 9.375 SM OPEN (CLIP) IMPLANT
CNTNR URN SCR LID CUP LEK RST (MISCELLANEOUS) ×2 IMPLANT
COVER PROBE GAMMA FINDER SLV (MISCELLANEOUS) ×2 IMPLANT
DERMABOND ADVANCED .7 DNX12 (GAUZE/BANDAGES/DRESSINGS) ×4 IMPLANT
DEVICE DUBIN SPECIMEN MAMMOGRA (MISCELLANEOUS) ×2 IMPLANT
DRAIN CHANNEL JP 15F RND 3/16 (MISCELLANEOUS) IMPLANT
DRAIN CHANNEL JP 19F RND 3/16 (MISCELLANEOUS) IMPLANT
DRAPE LAPAROTOMY TRNSV 106X77 (MISCELLANEOUS) ×2 IMPLANT
DRSG GAUZE FLUFF 36X18 (GAUZE/BANDAGES/DRESSINGS) ×2 IMPLANT
ELECTRODE BLDE 4.0 EZ CLN MEGD (MISCELLANEOUS) IMPLANT
ELECTRODE REM PT RTRN 9FT ADLT (ELECTROSURGICAL) ×2 IMPLANT
EVACUATOR DRAINAGE 10X20 100CC (DRAIN) ×2 IMPLANT
EVACUATOR SILICONE 100CC (DRAIN) IMPLANT
GAUZE 4X4 16PLY ~~LOC~~+RFID DBL (SPONGE) ×2 IMPLANT
GAUZE SPONGE 4X4 12PLY STRL (GAUZE/BANDAGES/DRESSINGS) ×2 IMPLANT
GLOVE BIO SURGEON STRL SZ 6.5 (GLOVE) ×4 IMPLANT
GLOVE BIOGEL PI IND STRL 6.5 (GLOVE) ×2 IMPLANT
GLOVE SURG SYN 6.5 PF PI (GLOVE) ×4 IMPLANT
GOWN STRL REUS W/ TWL LRG LVL3 (GOWN DISPOSABLE) ×6 IMPLANT
KIT TURNOVER KIT A (KITS) ×2 IMPLANT
LABEL OR SOLS (LABEL) ×2 IMPLANT
MANIFOLD NEPTUNE II (INSTRUMENTS) ×2 IMPLANT
NDL HYPO 22X1.5 SAFETY MO (MISCELLANEOUS) ×2 IMPLANT
NDL SAFETY ECLIP 18X1.5 (MISCELLANEOUS) IMPLANT
PACK BASIN MAJOR ARMC (MISCELLANEOUS) ×2 IMPLANT
PACK UNIVERSAL (MISCELLANEOUS) IMPLANT
SOLN STERILE WATER BTL 1000 ML (IV SOLUTION) ×2 IMPLANT
SPONGE DRAIN TRACH 4X4 STRL 2S (GAUZE/BANDAGES/DRESSINGS) IMPLANT
SPONGE T-LAP 18X18 ~~LOC~~+RFID (SPONGE) ×4 IMPLANT
SUT SILK 2 0 SH (SUTURE) ×2 IMPLANT
SUT SILK 3 0 SH CR/8 (SUTURE) ×2 IMPLANT
SUT VIC AB 3-0 54X BRD REEL (SUTURE) ×2 IMPLANT
SUT VIC AB 3-0 SH 27X BRD (SUTURE) IMPLANT
SUT VIC AB 3-0 SH 8-18 (SUTURE) ×2 IMPLANT
SUT VICRYL 3-0 CR8 SH (SUTURE) IMPLANT
SUTURE EHLN 3-0 FS-10 30 BLK (SUTURE) ×2 IMPLANT
SUTURE MNCRL 4-0 27XMF (SUTURE) ×4 IMPLANT
SYR 10ML LL (SYRINGE) IMPLANT
SYR BULB IRRIG 60ML STRL (SYRINGE) ×2 IMPLANT
TAPE CLOTH SURG 4X10 WHT LF (GAUZE/BANDAGES/DRESSINGS) IMPLANT
TRAP FLUID SMOKE EVACUATOR (MISCELLANEOUS) ×2 IMPLANT
ULTRASOUND BK UROLOGY PROCEDUR (MISCELLANEOUS) IMPLANT

## 2024-01-18 NOTE — Anesthesia Postprocedure Evaluation (Signed)
 Anesthesia Post Note  Patient: Nashae A Harp  Procedure(s) Performed: MASTECTOMY, SIMPLE (Bilateral: Breast) BIOPSY, LYMPH NODE, SENTINEL, AXILLARY (Left: Breast)  Patient location during evaluation: PACU Anesthesia Type: General Level of consciousness: awake and alert Pain management: pain level controlled Vital Signs Assessment: post-procedure vital signs reviewed and stable Respiratory status: spontaneous breathing, nonlabored ventilation and respiratory function stable Cardiovascular status: blood pressure returned to baseline and stable Postop Assessment: no apparent nausea or vomiting Anesthetic complications: no   No notable events documented.   Last Vitals:  Vitals:   01/18/24 1240 01/18/24 1245  BP: (!) 140/46   Pulse:    Resp:    Temp:    SpO2:  92%    Last Pain:  Vitals:   01/18/24 1311  TempSrc:   PainSc: 8                  Fairy POUR Jan Walters

## 2024-01-18 NOTE — Anesthesia Preprocedure Evaluation (Addendum)
 Anesthesia Evaluation  Patient identified by MRN, date of birth, ID band Patient awake    Reviewed: Allergy & Precautions, NPO status , Patient's Chart, lab work & pertinent test results  History of Anesthesia Complications (+) history of anesthetic complications  Airway Mallampati: III  TM Distance: <3 FB Neck ROM: full    Dental  (+) Missing   Pulmonary shortness of breath and with exertion, sleep apnea , COPD,  COPD inhaler and oxygen  dependent, Current Smoker and Patient abstained from smoking.   + rhonchi  + decreased breath sounds      Cardiovascular hypertension, + Peripheral Vascular Disease and + DOE  Normal cardiovascular exam+ dysrhythmias      Neuro/Psych  Headaches PSYCHIATRIC DISORDERS      CVA, Residual Symptoms    GI/Hepatic negative GI ROS, Neg liver ROS,neg GERD  ,,  Endo/Other  diabetes, Type 2, Insulin  Dependent    Renal/GU DialysisRenal disease     Musculoskeletal   Abdominal   Peds  Hematology negative hematology ROS (+)   Anesthesia Other Findings Past Medical History: 01/29/2015: Acute ischemic left MCA stroke (HCC)     Comment:  a.) MRI brain 01/29/2015: acute left MCA territory               infarcts involving the posterior left frontal lobe and               left insular cortex No date: Allergy No date: Anemia of chronic renal failure No date: Anxiety No date: Aortic atherosclerosis No date: Arthritis No date: Complication of anesthesia     Comment:  a.) PONV; b.) awareness under anesthesia No date: COPD (chronic obstructive pulmonary disease) (HCC) No date: Depression No date: Dyspnea No date: ESRD (end stage renal disease) on dialysis (M-W-F) No date: Headache No date: HFrEF (heart failure with reduced ejection fraction) (HCC)     Comment:  a.) TTE 01/30/2015: EF 30-35%, diff HK, LAE, G2DD; b.)               TTE 06/13/2015: EF 40-45%, diff HK, G1DD; c.) TTE                10/06/2017: EF 60-65% MAC, no IAS; d.) TTE 09/29/2021: EF              45-50%;, LVH, LAE, AoV sclerosis, G1DD; e.) TTE               02/24/2022: EF 60-65%, no IAS No date: HTN (hypertension) No date: Leukocytosis No date: Malignant neoplasm of left breast (HCC) No date: NICM (nonischemic cardiomyopathy) (HCC)     Comment:  a.) TTE 01/30/2015: EF 30-35%; b.) R/LHC 03/25/2015: EF               25-30%, mRA 13, mPA 30, mPCWP 26, LVEDP 17, CO 3.66, CI               1.8; c.) TTE 06/13/2015: EF 40-45%; d.) TTE 10/06/2017:               EF 60-65%; e.) TTE 09/29/2021: EF 45-50%; f.) TTE               02/24/2022: EF 60-65% No date: Obesity No date: On apixaban  therapy No date: On supplemental oxygen  by nasal cannula     Comment:  a.) 2L/[place] at night No date: Osteoporosis No date: Peripheral vascular disease No date: 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 No date: Type 2 diabetes  mellitus treated with insulin  [facility])  Past Surgical History: No date: ABDOMINAL HYSTERECTOMY 02/09/2000: ANKLE FRACTURE SURGERY; Left 06/18/2022: AV FISTULA PLACEMENT; Left     Comment:  Procedure: ARTERIOVENOUS (AV) FISTULA CREATION (BRACHIAL              AXILLA);  Surgeon: Jama Cordella MATSU, MD;  Location:               [facility] ORS;  Service: Vascular;  Laterality: Left; 02/08/1998: BILATERAL SALPINGOOPHORECTOMY No date: BREAST BIOPSY; Left     Comment:  US  BX LEFT BREAST COIL CLIP-PATH PENDING 12/20/2023: BREAST BIOPSY; Left     Comment:  US  LT BREAST BX W LOC DEV 1ST LESION IMG BX SPEC US                GUIDE 12/20/2023 [facility] 12/20/2023: BREAST BIOPSY; Left     Comment:  US  LT BREAST BX W LOC DEV EA ADD LESION IMG BX SPEC US                GUIDE 12/20/2023 [facility] 03/25/2015: CARDIAC CATHETERIZATION; N/A     Comment:  Procedure: Right/Left Heart Cath and Coronary               Angiography;  Surgeon: Victory LELON Sharps, MD;  Location: [facility]               INVASIVE CV LAB;   Service: Cardiovascular;  Laterality:               N/A; 02/09/1976: CESAREAN SECTION 09/22/2016: COLONOSCOPY WITH PROPOFOL ; N/A     Comment:  Procedure: COLONOSCOPY WITH PROPOFOL ;  Surgeon: Viktoria Lamar DASEN, MD;  Location: [facility] ENDOSCOPY;  Service:               Endoscopy;  Laterality: N/A; 02/15/2022: COLONOSCOPY WITH PROPOFOL ; N/A     Comment:  Procedure: COLONOSCOPY WITH PROPOFOL ;  Surgeon: Unk Corinn Skiff, MD;  Location: [facility] ENDOSCOPY;  Service:               Gastroenterology;  Laterality: N/A; 01/19/2023: DIALYSIS/PERMA CATHETER INSERTION; N/A     Comment:  Procedure: DIALYSIS/PERMA CATHETER INSERTION;  Surgeon:               Marea Selinda RAMAN, MD;  Location: [facility] INVASIVE CV LAB;                Service: Cardiovascular;  Laterality: N/A; 08/05/2015: EP IMPLANTABLE DEVICE; N/A     Comment:  Procedure: Loop Recorder Insertion;  Surgeon: Elspeth JAYSON Sage, MD;  Location: [facility] INVASIVE CV LAB;  Service:               Cardiovascular;  Laterality: N/A; 02/15/2022: ESOPHAGOGASTRODUODENOSCOPY; N/A     Comment:  Procedure: ESOPHAGOGASTRODUODENOSCOPY (EGD);  Surgeon:               Unk Corinn Skiff, MD;  Location: [facility] ENDOSCOPY;                Service: Gastroenterology;  Laterality: N/A; 09/22/2016: ESOPHAGOGASTRODUODENOSCOPY (EGD) WITH PROPOFOL ; N/A     Comment:  Procedure: ESOPHAGOGASTRODUODENOSCOPY (EGD) WITH               PROPOFOL ;  Surgeon: Viktoria Lamar DASEN, MD;  Location:  [facility] ENDOSCOPY;  Service: Endoscopy;  Laterality: N/A; 12/08/2016: ESOPHAGOGASTRODUODENOSCOPY (EGD) WITH PROPOFOL ; N/A     Comment:  Procedure: ESOPHAGOGASTRODUODENOSCOPY (EGD) WITH               PROPOFOL ;  Surgeon: Viktoria Lamar DASEN, MD;  Location:               [facility] ENDOSCOPY;  Service: Endoscopy;  Laterality: N/A; 10/12/2022: ESOPHAGOGASTRODUODENOSCOPY (EGD) WITH PROPOFOL ; N/A     Comment:  Procedure: ESOPHAGOGASTRODUODENOSCOPY (EGD) WITH                PROPOFOL ;  Surgeon: Unk Corinn Skiff, MD;  Location:               [facility] ENDOSCOPY;  Service: Gastroenterology;  Laterality:               N/A; 12/27/2022: FACIAL LACERATION REPAIR; N/A     Comment:  Procedure: FACIAL LACERATION REPAIR;  Surgeon: Luciano Standing, MD;  Location: [facility] OR;  Service: ENT;  Laterality:              N/A; No date: FRACTURE SURGERY 04/19/2022: GIVENS CAPSULE STUDY; N/A     Comment:  Procedure: GIVENS CAPSULE STUDY;  Surgeon: Unk Corinn Skiff, MD;  Location: [facility] ENDOSCOPY;  Service:               Gastroenterology;  Laterality: N/A; 10/12/2022: GIVENS CAPSULE STUDY; N/A     Comment:  Procedure: GIVENS CAPSULE STUDY;  Surgeon: Unk Corinn Skiff, MD;  Location: [facility] ENDOSCOPY;  Service:               Gastroenterology;  Laterality: N/A; 06/18/2022: INSERTION HYBRID ANTERIOVENOUS GORTEX GRAFT     Comment:  Procedure: INSERTION HYBRID ANTERIOVENOUS GORTEX GRAFT;               Surgeon: Jama Cordella MATSU, MD;  Location: [facility] ORS;                Service: Vascular;; 02/09/2003: KNEE ARTHROSCOPY; Left 08/12/2021: LOWER EXTREMITY ANGIOGRAPHY; Left     Comment:  Procedure: Lower Extremity Angiography;  Surgeon:               Jama Cordella MATSU, MD;  Location: [facility] INVASIVE CV LAB;               Service: Cardiovascular;  Laterality: Left; 01/19/2023: PERIPHERAL VASCULAR THROMBECTOMY; Left     Comment:  Procedure: PERIPHERAL VASCULAR THROMBECTOMY;  Surgeon:               Marea Selinda RAMAN, MD;  Location: [facility] INVASIVE CV LAB;                Service: Cardiovascular;  Laterality: Left; 01/31/2015: TEE WITHOUT CARDIOVERSION; N/A     Comment:  Procedure: TRANSESOPHAGEAL ECHOCARDIOGRAM (TEE);                Surgeon: Redell RAMAN Shallow, MD;  Location: [facility] ENDOSCOPY;                Service: Cardiovascular;  Laterality: N/A; 08/14/2021: TEMPORARY DIALYSIS CATHETER; N/A     Comment:  Procedure: TEMPORARY DIALYSIS CATHETER;  Surgeon:                Jama Cordella MATSU, MD;  Location: [facility] INVASIVE CV LAB;               Service: Cardiovascular;  Laterality: N/A; 02/09/1976: TUBAL LIGATION 02/08/2006: ULNAR NERVE TRANSPOSITION No date: US  LT BREAST BX W LOC DEV 1ST LESION IMG BX SPEC US  GUIDE;  Left     Comment:  US  BX LEFT BREAST RIBBON CLIP-PATH PENDING     Reproductive/Obstetrics negative OB ROS                              Anesthesia Physical Anesthesia Plan  ASA: 4  Anesthesia Plan: General ETT   Post-op Pain Management:    Induction: Intravenous  PONV Risk Score and Plan: Ondansetron , Dexamethasone , Midazolam  and Treatment may vary due to age or medical condition  Airway Management Planned: Oral ETT  Additional Equipment:   Intra-op Plan:   Post-operative Plan: Extubation in OR  Informed Consent: I have reviewed the patients History and Physical, chart, labs and discussed the procedure including the risks, benefits and alternatives for the proposed anesthesia with the patient or authorized representative who has indicated his/her understanding and acceptance.     Dental Advisory Given  Plan Discussed with: Anesthesiologist, CRNA and Surgeon  Anesthesia Plan Comments: (Patient consented for risks of anesthesia including but not limited to:  - adverse reactions to medications - damage to eyes, teeth, lips or other oral mucosa - nerve damage due to positioning  - sore throat or hoarseness - Damage to heart, brain, nerves, lungs, other parts of body or loss of life  Patient voiced understanding and assent.)         Anesthesia Quick Evaluation

## 2024-01-18 NOTE — Plan of Care (Signed)
  Problem: Education: Goal: Knowledge of General Education information will improve Description: Including pain rating scale, medication(s)/side effects and non-pharmacologic comfort measures Outcome: Progressing   Problem: Clinical Measurements: Goal: Ability to maintain clinical measurements within normal limits will improve Outcome: Progressing Goal: Will remain free from infection Outcome: Progressing Goal: Diagnostic test results will improve Outcome: Progressing Goal: Respiratory complications will improve Outcome: Progressing Goal: Cardiovascular complication will be avoided Outcome: Progressing   Problem: Activity: Goal: Risk for activity intolerance will decrease Outcome: Progressing   Problem: Pain Managment: Goal: General experience of comfort will improve and/or be controlled Outcome: Progressing   Problem: Safety: Goal: Ability to remain free from injury will improve Outcome: Progressing   Problem: Skin Integrity: Goal: Risk for impaired skin integrity will decrease Outcome: Progressing

## 2024-01-18 NOTE — Transfer of Care (Signed)
 Immediate Anesthesia Transfer of Care Note  Patient: Jocelyn Wells  Procedure(s) Performed: MASTECTOMY, SIMPLE (Bilateral: Breast) BIOPSY, LYMPH NODE, SENTINEL, AXILLARY (Left: Breast)  Patient Location: PACU  Anesthesia Type:General  Level of Consciousness: drowsy and patient cooperative  Airway & Oxygen  Therapy: Patient Spontanous Breathing and Patient connected to nasal cannula oxygen   Post-op Assessment: Report given to RN and Post -op Vital signs reviewed and stable  Post vital signs: Reviewed and stable  Last Vitals:  Vitals Value Taken Time  BP 140/46 01/18/24 12:40  Temp 36.3 C 01/18/24 12:36  Pulse 89 01/18/24 12:41  Resp 20 01/18/24 12:41  SpO2 90 % 01/18/24 12:41  Vitals shown include unfiled device data.  Last Pain:  Vitals:   01/18/24 1236  TempSrc: Tympanic  PainSc:          Complications: No notable events documented.

## 2024-01-18 NOTE — Anesthesia Procedure Notes (Signed)
 Procedure Name: Intubation Date/Time: 01/18/2024 9:58 AM  Performed by: Lorrene Camelia LABOR, CRNAPre-anesthesia Checklist: Patient identified, Patient being monitored, Timeout performed, Emergency Drugs available and Suction available Patient Re-evaluated:Patient Re-evaluated prior to induction Oxygen  Delivery Method: Circle system utilized Preoxygenation: Pre-oxygenation with 100% oxygen  Induction Type: IV induction Ventilation: Mask ventilation without difficulty Laryngoscope Size: 3 and McGrath Grade View: Grade I Tube type: Oral Tube size: 7.0 mm Number of attempts: 1 Airway Equipment and Method: Stylet Placement Confirmation: ETT inserted through vocal cords under direct vision, positive ETCO2 and breath sounds checked- equal and bilateral Secured at: 21 cm Tube secured with: Tape Dental Injury: Teeth and Oropharynx as per pre-operative assessment

## 2024-01-18 NOTE — Inpatient Diabetes Management (Addendum)
 Inpatient Diabetes Program Recommendations  AACE/ADA: New Consensus Statement on Inpatient Glycemic Control   Target Ranges:  Prepandial:   less than 140 mg/dL      Peak postprandial:   less than 180 mg/dL (1-2 hours)      Critically ill patients:  140 - 180 mg/dL    Latest Reference Range & Units 01/18/24 12:37  Glucose-Capillary 70 - 99 mg/dL 744 (H)    Latest Reference Range & Units 01/18/24 09:22  Glucose 70 - 99 mg/dL 839 (H)   Review of Glycemic Control  Diabetes history: DM2 Outpatient Diabetes medications: OmniPod insulin  pump and FreeStyle Libre 2 CGM Current orders for Inpatient glycemic control: None  Inpatient Diabetes Program Recommendations:    Insulin : Patient is too drowsy to operate insulin  pump and family agreeable that best plan would be to remove the pump and have patient receive SQ insulin  while inpatient. Recommend for patient to remove the insulin  pump and order insulin  glargine 20 units Q24H, CBGs Q4H, and Novolog  0-15 units Q4H.    HbgA1C: A1C 8.1% on 10/14/23 in Care Everywhere.  NOTE: Patient is currently in PACU following surgery today. Per chart review, patient has DM2, uses an OmniPod insulin  pump and FreeStyle Libre 2 CGM sensor, and sees Dr. Damian (Endocrinologist) for DM control. Patient last seen Dr. Damian on 01/03/24 and per office note pump settings should be:  Basal rate 12 AM: 1.2 units/hr  12 PM: 1.55 units/hr 24hr basal = 33 units  Bolus settings Sensitivity 20 I:C ratio 7.5 Target blood glucose 120   Sent chat message to Barnie, RN in PACU to inquire about patient using her insulin  pump. RN reports that patient is confused and is unsure about information regarding insulin  pump. Will plan to see patient to see if patient is appropriate to use the insulin  pump while inpatient or if it needs to be removed and SQ insulin  regimen used.  Addendum 01/18/24@15 :04-Spoke with patient and family at bedside in room 209. Patient very drowsy and opened  eyes and answered yes to a couple questions then drifted back to sleep with loud snoring. Patient has OmniPod insulin  pump on lower abdomen and also has a [employer] 2 CGM sensor on. Family reports that patient's insulin  pump is currently in auto mode and current glucose is 210 mg/dl per CGM sensor. Patient's family states that since patient started using the insulin  pump several months ago, her glucose has been better. They state that patient still frequently forgets to bolus for food. Switched to manual mode to be able to review basal insulin  settings in PDM device at bedside which are exactly as noted above. Discussed with patient and family that it may be best to remove the insulin  pump and use SQ insulin  regimen while inpatient and family is agreeable to that plan. Discussed that it would be recommended to remove the insulin  pump and have basal insulin  ordered along with Novolog  correction Q4H. Informed patient that I would send recommendations to the provider and if they are agreeable, orders will be placed and the nurse can update them as to whether to remove the insulin  pump. Sent chat message to DR. Rodolph armin Lloyd, RN regarding recommendations to remove the insulin  pump and order SQ insulin  regimen.  Addendum 01/18/24@15 :30-Went back by room to ask family to go ahead and put pump back in auto mode to help keep glucose better controlled while she still has the insulin  pump on. Family switched back to auto mode. Informed patient and family  that a message with recommendations was sent to attending so up to provider as to whether the pump will be removed or not. Current CGM glucose was 176 mg/dl at time of return visit.  Thanks, Earnie Gainer, RN, MSN, CDCES Diabetes Coordinator Inpatient Diabetes Program [phone] (Team Pager from 8am to 5pm)

## 2024-01-18 NOTE — Progress Notes (Signed)
 Patient awake/alert, per family patient sleeps a lot all day Patient is easily arouses and answers questions. Diabetes Coordinator to see patient regarding insulin  coverage. Patient with OmniPod insuln pump and FreeStyle Libre Questions concerning functioning and coverage.  Surgery site with fluff gauze, surgery bra and bilateral JP drains in place. No s/s hematoma noted, Jp's patent , pulses bilaterally intact. CMS+ bilateral upper ext.   '

## 2024-01-18 NOTE — Consult Note (Signed)
 Initial Consultation Note   Patient: Jocelyn Wells DOB: 1955/02/13 PCP: Donzella Lauraine SAILOR, DO DOA: 01/18/2024 DOS: the patient was seen and examined on 01/18/2024 Primary service: Rodolph Romano, MD  Referring physician: Dr. Cesar Reason for consult: Diabetic management.   Assessment and Plan: Uncontrolled type 2 diabetes with hyperglycemia. Per patient's son, patient was using insulin  pump, with a basal rate of 4 units/h.  Glucose running high now, but patient just had surgery, the appetite may be poor.  I need to reduce the dose of insulin  to insulin  glargine 25 units every evening, 4 units of NovoLog  3 times a day and sliding scale insulin . Per patient's son, patient memory has been deteriorating since had a stroke, no longer appropriate for using insulin  pump by her.  Probably will be transition to subcu insulin  at time of discharge. Check A1c, follow glucose, adjust dose daily.  End-stage renal disease on dialysis. Will consult nephrology for dialysis.  Patient normally dialyzed Monday Wednesday and Friday, had a dialysis session yesterday in preparation for surgery.  Will need another session on Friday.  Currently, patient does not have volume overload.  History of stroke. Patient has some memory issues since had a stroke.  Could be developing vascular dementia.  Follow-up with PCP as outpatient.  COPD. Chronic hypoxemic respiratory failure. Condition stable, no bronchospasm.  Chronic diastolic congestive heart failure. No exacerbation. Recent echocardiogram performed 08/2022 showed ejection fraction 50 to 55% with grade 1 diastolic dysfunction.  Mild elevated pulmonary blood pressure.   TRH will continue to follow the patient.  HPI: Jocelyn Wells is a 68 y.o. female with past medical history of type 2 diabetes, history of stroke, end-stage renal disease on dialysis, chronic diastolic congestive heart failure, COPD and chronic respiratory failure with  hypoxemia present to the hospital for elective bilateral mastectomy with a new diagnosis of breast cancer. Postoperatively, patient doing well, hospitalist consulted for management diabetes. Currently, patient does not have any complaints other than postop pain  Review of Systems: As mentioned in the history of present illness. All other systems reviewed and are negative. Past Medical History:  Diagnosis Date   Acute ischemic left MCA stroke (HCC) 01/29/2015   a.) MRI brain 01/29/2015: acute left MCA territory infarcts involving the posterior left frontal lobe and left insular cortex   Allergy    Anemia of chronic renal failure    Anxiety    Aortic atherosclerosis    Arthritis    Complication of anesthesia    a.) PONV; b.) awareness under anesthesia   COPD (chronic obstructive pulmonary disease) (HCC)    Depression    Dyspnea    ESRD (end stage renal disease) on dialysis (M-W-F)    Headache    HFrEF (heart failure with reduced ejection fraction) (HCC)    a.) TTE 01/30/2015: EF 30-35%, diff HK, LAE, G2DD; b.) TTE 06/13/2015: EF 40-45%, diff HK, G1DD; c.) TTE 10/06/2017: EF 60-65% MAC, no IAS; d.) TTE 09/29/2021: EF 45-50%;, LVH, LAE, AoV sclerosis, G1DD; e.) TTE 02/24/2022: EF 60-65%, no IAS   HTN (hypertension)    Leukocytosis    Malignant neoplasm of left breast (HCC)    NICM (nonischemic cardiomyopathy) (HCC)    a.) TTE 01/30/2015: EF 30-35%; b.) R/LHC 03/25/2015: EF 25-30%, mRA 13, mPA 30, mPCWP 26, LVEDP 17, CO 3.66, CI 1.8; c.) TTE 06/13/2015: EF 40-45%; d.) TTE 10/06/2017: EF 60-65%; e.) TTE 09/29/2021: EF 45-50%; f.) TTE 02/24/2022: EF 60-65%   Obesity    On apixaban   therapy    On supplemental oxygen  by nasal cannula    a.) 2L/[place] at night   Osteoporosis    Peripheral vascular disease    Right bundle branch block (RBBB) with left anterior fascicular block    Type 2 diabetes mellitus treated with insulin  [facility])    Past Surgical History:  Procedure Laterality Date    ABDOMINAL HYSTERECTOMY     ANKLE FRACTURE SURGERY Left 02/09/2000   AV FISTULA PLACEMENT Left 06/18/2022   Procedure: ARTERIOVENOUS (AV) FISTULA CREATION (BRACHIAL AXILLA);  Surgeon: Jama Cordella MATSU, MD;  Location: [facility] ORS;  Service: Vascular;  Laterality: Left;   BILATERAL SALPINGOOPHORECTOMY  02/08/1998   BREAST BIOPSY Left    US  BX LEFT BREAST COIL CLIP-PATH PENDING   BREAST BIOPSY Left 12/20/2023   US  LT BREAST BX W LOC DEV 1ST LESION IMG BX SPEC US  GUIDE 12/20/2023 [facility]   BREAST BIOPSY Left 12/20/2023   US  LT BREAST BX W LOC DEV EA ADD LESION IMG BX SPEC US  GUIDE 12/20/2023 [facility]   CARDIAC CATHETERIZATION N/A 03/25/2015   Procedure: Right/Left Heart Cath and Coronary Angiography;  Surgeon: Victory LELON Sharps, MD;  Location: [facility] INVASIVE CV LAB;  Service: Cardiovascular;  Laterality: N/A;   CESAREAN SECTION  02/09/1976   COLONOSCOPY WITH PROPOFOL  N/A 09/22/2016   Procedure: COLONOSCOPY WITH PROPOFOL ;  Surgeon: Viktoria Lamar DASEN, MD;  Location: [facility] ENDOSCOPY;  Service: Endoscopy;  Laterality: N/A;   COLONOSCOPY WITH PROPOFOL  N/A 02/15/2022   Procedure: COLONOSCOPY WITH PROPOFOL ;  Surgeon: Unk Corinn Skiff, MD;  Location: [facility] ENDOSCOPY;  Service: Gastroenterology;  Laterality: N/A;   DIALYSIS/PERMA CATHETER INSERTION N/A 01/19/2023   Procedure: DIALYSIS/PERMA CATHETER INSERTION;  Surgeon: Marea Selinda RAMAN, MD;  Location: [facility] INVASIVE CV LAB;  Service: Cardiovascular;  Laterality: N/A;   EP IMPLANTABLE DEVICE N/A 08/05/2015   Procedure: Loop Recorder Insertion;  Surgeon: Elspeth JAYSON Sage, MD;  Location: [facility] INVASIVE CV LAB;  Service: Cardiovascular;  Laterality: N/A;   ESOPHAGOGASTRODUODENOSCOPY N/A 02/15/2022   Procedure: ESOPHAGOGASTRODUODENOSCOPY (EGD);  Surgeon: Unk Corinn Skiff, MD;  Location: [facility] ENDOSCOPY;  Service: Gastroenterology;  Laterality: N/A;   ESOPHAGOGASTRODUODENOSCOPY (EGD) WITH PROPOFOL  N/A 09/22/2016   Procedure: ESOPHAGOGASTRODUODENOSCOPY  (EGD) WITH PROPOFOL ;  Surgeon: Viktoria Lamar DASEN, MD;  Location: [facility] ENDOSCOPY;  Service: Endoscopy;  Laterality: N/A;   ESOPHAGOGASTRODUODENOSCOPY (EGD) WITH PROPOFOL  N/A 12/08/2016   Procedure: ESOPHAGOGASTRODUODENOSCOPY (EGD) WITH PROPOFOL ;  Surgeon: Viktoria Lamar DASEN, MD;  Location: [facility] ENDOSCOPY;  Service: Endoscopy;  Laterality: N/A;   ESOPHAGOGASTRODUODENOSCOPY (EGD) WITH PROPOFOL  N/A 10/12/2022   Procedure: ESOPHAGOGASTRODUODENOSCOPY (EGD) WITH PROPOFOL ;  Surgeon: Unk Corinn Skiff, MD;  Location: [facility] ENDOSCOPY;  Service: Gastroenterology;  Laterality: N/A;   FACIAL LACERATION REPAIR N/A 12/27/2022   Procedure: FACIAL LACERATION REPAIR;  Surgeon: Luciano Elspeth, MD;  Location: [facility] OR;  Service: ENT;  Laterality: N/A;   FRACTURE SURGERY     GIVENS CAPSULE STUDY N/A 04/19/2022   Procedure: GIVENS CAPSULE STUDY;  Surgeon: Unk Corinn Skiff, MD;  Location: [facility] ENDOSCOPY;  Service: Gastroenterology;  Laterality: N/A;   GIVENS CAPSULE STUDY N/A 10/12/2022   Procedure: GIVENS CAPSULE STUDY;  Surgeon: Unk Corinn Skiff, MD;  Location: [facility] ENDOSCOPY;  Service: Gastroenterology;  Laterality: N/A;   INSERTION HYBRID ANTERIOVENOUS GORTEX GRAFT  06/18/2022   Procedure: INSERTION HYBRID ANTERIOVENOUS GORTEX GRAFT;  Surgeon: Jama Cordella MATSU, MD;  Location: [facility] ORS;  Service: Vascular;;   KNEE ARTHROSCOPY Left 02/09/2003   LOWER EXTREMITY ANGIOGRAPHY Left 08/12/2021   Procedure: Lower Extremity Angiography;  Surgeon: Jama Cordella  G, MD;  Location: [facility] INVASIVE CV LAB;  Service: Cardiovascular;  Laterality: Left;   PERIPHERAL VASCULAR THROMBECTOMY Left 01/19/2023   Procedure: PERIPHERAL VASCULAR THROMBECTOMY;  Surgeon: Marea Selinda RAMAN, MD;  Location: [facility] INVASIVE CV LAB;  Service: Cardiovascular;  Laterality: Left;   TEE WITHOUT CARDIOVERSION N/A 01/31/2015   Procedure: TRANSESOPHAGEAL ECHOCARDIOGRAM (TEE);  Surgeon: Redell RAMAN Shallow, MD;  Location: [facility] ENDOSCOPY;  Service: Cardiovascular;   Laterality: N/A;   TEMPORARY DIALYSIS CATHETER N/A 08/14/2021   Procedure: TEMPORARY DIALYSIS CATHETER;  Surgeon: Jama Cordella MATSU, MD;  Location: [facility] INVASIVE CV LAB;  Service: Cardiovascular;  Laterality: N/A;   TUBAL LIGATION  02/09/1976   ULNAR NERVE TRANSPOSITION  02/08/2006   US  LT BREAST BX W LOC DEV 1ST LESION IMG BX SPEC US  GUIDE Left    US  BX LEFT BREAST RIBBON CLIP-PATH PENDING   Social History:  reports that she has been smoking cigarettes. She started smoking about 49 years ago. She has a 24.2 pack-year smoking history. She has never used smokeless tobacco. She reports that she does not drink alcohol and does not use drugs.  Allergies  Allergen Reactions   Ivp Dye [Iodinated Contrast Media] Anaphylaxis    Kidney Failure   Codeine Nausea And Vomiting    Family History  Problem Relation Age of Onset   Heart disease Mother        died from CHF   Asthma Mother    Diabetes Mother    Heart disease Father    Aneurysm Father    COPD Brother    Diabetes Brother    Alcohol abuse Paternal Aunt    Cancer Maternal Grandmother        unknownorigin   Anemia Neg Hx    Arrhythmia Neg Hx    Clotting disorder Neg Hx    Fainting Neg Hx    Heart attack Neg Hx    Heart failure Neg Hx    Hyperlipidemia Neg Hx    Hypertension Neg Hx     Prior to Admission medications   Medication Sig Start Date End Date Taking? Authorizing Provider  albuterol  (VENTOLIN  HFA) 108 (90 Base) MCG/ACT inhaler TAKE 2 PUFFS BY MOUTH EVERY 6 HOURS AS NEEDED FOR WHEEZE OR SHORTNESS OF BREATH 06/19/21  Yes Emilio Kelly DASEN, FNP  atorvastatin  (LIPITOR) 40 MG tablet Take 1 tablet (40 mg total) by mouth daily. 11/11/23 11/10/24 Yes Hammock, Sheri, NP  buPROPion  (WELLBUTRIN  SR) 150 MG 12 hr tablet Take 1 tablet (150 mg total) by mouth 2 (two) times daily. 09/12/23  Yes Pardue, Lauraine SAILOR, DO  calcium  carbonate (TUMS EX) 750 MG chewable tablet Chew 1 tablet by mouth daily.   Yes [provider]  ELIQUIS  5 MG  TABS tablet TAKE 1 TABLET TWICE A DAY 09/02/23  Yes Hammock, Sheri, NP  fluticasone  (FLONASE ) 50 MCG/ACT nasal spray Place 2 sprays into both nostrils daily. Patient taking differently: Place 2 sprays into both nostrils daily as needed for allergies. 02/13/21  Yes Emilio Kelly DASEN, FNP  gabapentin  (NEURONTIN ) 300 MG capsule Take 300 mg by mouth daily.   Yes [provider]  hydroxyurea  (HYDREA ) 500 MG capsule TAKE 1 CAPSULE BY MOUTH EVERY OTHER DAY. MAY TAKE WITH FOOD TO MINIMIZE GI SIDE EFFECTS. Patient taking differently: Take 500 mg by mouth See admin instructions. Take 500 mg by mouth 6 days a week 10/11/23  Yes Melanee Annah BROCKS, MD  insulin  aspart (NOVOLOG ) 100 UNIT/ML injection Insulin  pump 05/26/20  Yes [provider]  loratadine  (CLARITIN ) 10 MG tablet Take 10 mg by mouth daily.   Yes [provider]  meclizine  (ANTIVERT ) 12.5 MG tablet Take 1 tablet (12.5 mg total) by mouth 3 (three) times daily as needed for dizziness. 11/23/21  Yes Rumball, Alison M, DO  OXYGEN  Inhale 2 L into the lungs at bedtime.   Yes [provider]  PARoxetine  (PAXIL ) 40 MG tablet Take 1 tablet (40 mg total) by mouth daily. 09/12/23  Yes Pardue, Lauraine SAILOR, DO  torsemide  (DEMADEX ) 20 MG tablet TAKE 3 TABLETS BY MOUTH EVERY DAY Patient taking differently: Take 40 mg by mouth daily. 10/18/23  Yes Darliss Rogue, MD  traMADol  (ULTRAM ) 50 MG tablet Take 1 tablet (50 mg total) by mouth 2 (two) times daily as needed. 11/21/23  Yes Patel, Seema K, NP  Vitamin D , Ergocalciferol , (DRISDOL ) 1.25 MG (50000 UNIT) CAPS capsule Take 50,000 Units by mouth once a week. 11/26/23  Yes [provider]  Insulin  Disposable Pump (OMNIPOD DASH PODS, GEN 4,) MISC USE 1 POD EACH EVERY 72    HOURS 10/29/20   [provider]  Insulin  Human (INSULIN  PUMP) SOLN Per endocrinoloy 06/01/20   Elgergawy, Brayton RAMAN, MD  Spacer/Aero-Holding Chambers (OPTICHAMBER DIAMOND ) MISC Use with inhaler to ensure medication  is delivered throughout lungs Patient not taking: No sig reported 02/13/21   Emilio Kelly DASEN, FNP    Physical Exam: Vitals:   01/18/24 1245 01/18/24 1345 01/18/24 1400 01/18/24 1446  BP:   (!) 125/52 (!) 117/55  Pulse:   81 76  Resp:   (!) 21 (!) 21  Temp:  98.1 F (36.7 C) 97.9 F (36.6 C) 98.4 F (36.9 C)  TempSrc:    Oral  SpO2: 92%  94% 93%   Physical Exam Constitutional:      General: She is not in acute distress.    Appearance: She is not ill-appearing or toxic-appearing.  HENT:     Head: Normocephalic and atraumatic.     Nose: Nose normal. No congestion.     Mouth/Throat:     Mouth: Mucous membranes are moist.     Pharynx: No oropharyngeal exudate.  Eyes:     Extraocular Movements: Extraocular movements intact.     Conjunctiva/sclera: Conjunctivae normal.     Pupils: Pupils are equal, round, and reactive to light.  Neck:     Vascular: No carotid bruit.  Cardiovascular:     Rate and Rhythm: Normal rate and regular rhythm.     Heart sounds: No murmur heard.    No gallop.  Pulmonary:     Effort: Pulmonary effort is normal.     Comments: Decreased breath sounds without wheezes crackles Abdominal:     General: Abdomen is flat. Bowel sounds are normal. There is no distension.     Tenderness: There is no abdominal tenderness.  Musculoskeletal:        General: Normal range of motion.     Cervical back: Normal range of motion and neck supple. No rigidity.     Right lower leg: No edema.     Left lower leg: No edema.  Skin:    General: Skin is warm and dry.     Coloration: Skin is not jaundiced.  Neurological:     General: No focal deficit present.     Mental Status: She is alert and oriented to person, place, and time.     Cranial Nerves: No cranial nerve deficit.  Psychiatric:        Mood  and Affect: Mood normal.        Behavior: Behavior normal.     Data Reviewed:   Lab results reviewed.   Family Communication: Son updated at bedside. Primary team  communication:  Thank you very much for involving us  in the care of your patient.  Author: Murvin Mana, MD 01/18/2024 4:30 PM  For on call review [email or web address].

## 2024-01-18 NOTE — Interval H&P Note (Signed)
 History and Physical Interval Note:  01/18/2024 9:19 AM  Jocelyn Wells  has presented today for surgery, with the diagnosis of C50.812 Z17.0 Malignant neoplasm of overlapping sites of lt breast in female, estrogen receptor.  The various methods of treatment have been discussed with the patient and family. After consideration of risks, benefits and other options for treatment, the patient has consented to  Procedure(s): MASTECTOMY, SIMPLE (Bilateral) BIOPSY, LYMPH NODE, SENTINEL, AXILLARY (Left) as a surgical intervention.  The patient's history has been reviewed, patient examined, no change in status, stable for surgery.  I have reviewed the patient's chart and labs.  Questions were answered to the patient's satisfaction.     Lucas Sjogren

## 2024-01-18 NOTE — Op Note (Signed)
 Preoperative diagnosis: Invasive Mammary Carcinoma of the left breast.  Postoperative diagnosis: Same.   Procedure: Bilateral total mastectomy.                     Left axillary Sentinel Lymph node biopsy  Anesthesia: GETA  Surgeon: Dr. Cesar Coe  Assistant: Gilmer Cea Solis  Wound Classification: Clean  Indications: Patient is a 68 y.o. female who had an abnormal mammogram that on workup with core needle biopsy was found to be multicentric invasive mammary carcinoma. After discussion of alternatives, the patient elected total (simple) mastectomy.  Findings: No palpable masses of the left breast No obvious invasion into the thoracic cavity  Description of procedure: The patient was brought to the operating room and general anesthesia was induced. A time-out was completed verifying correct patient, procedure, site, positioning, and implant(s) and/or special equipment prior to beginning this procedure.  Bilateral breasts, chest wall, axilla, and upper arms and neck were prepped and draped in the usual sterile fashion.  Starting on the nonmalignant breast, A skin incision was made that encompassed the nipple-areola complex. Flaps were raised in the avascular plane between subcutaneous tissue and breast tissue from the clavicle superiorly, the sternum medially, the anterior rectus sheath inferiorly, and past the lateral border of the pectoralis major muscle laterally. Hemostasis was achieved in the flaps. Next, the breast tissue and underlying pectoralis fascia were excised from the pectoralis major muscle, progressing from medially to laterally. At the lateral border of the pectoralis major muscle, the breast tissue was swung laterally and a lateral pedicle identified where breast tissue gave way to fat of axilla. The lateral pedicle was incised and the specimen removed.   I covered the right chest wall with moist lap pad.  To proceed to perform the left total mastectomy with  sentinel node biopsy.  Using the same type of incision going wider due to suspected invasion of the mass to the skin.  Flaps were also raised in the avascular plane between subcutaneous tissue and breast tissue from the clavicle superiorly the sternum medially, the anterior rectus sheath inferiorly, and past the lateral border of the pectoralis major muscle laterally.  Hemostasis was achieved with cautery.  Next, the breast tissue and underlying pectoralis fascia was excised from the pectoralis major muscle, progressing from medially to lateral.  At the lateral border of the pectoralis major muscle the breast tissue was swung laterally and lateral pedicle identified where breast tissue gave way to the fat of the axilla.  The subdermal fascia of the axilla was advanced.  The probe was placed and found the point of maximal count.  Dissection continued until the node was identified. The probe was placed in contact with the node. The node was excised in its entirety. No additional hot spots were identified. No clinically abnormal nodes were palpated.  Then I proceeded to close both wounds.  The wounds was irrigated and hemostasis was achieved. Closed suction drains were brought into the operative field through a separate stab incision and sutured to the skin with a 3-0 nylon suture. The wound of both side of the chest were closed with interrupted 3-0 Vicryl to the subcutaneous layer, followed by a subcuticular layer of Monocryl 4-0. The wound was dressed.  The patient tolerated the procedure well and was taken to the postanesthesia care unit in stable condition.   The procedure was terminated. Hemostasis was achieved and the wound closed in layers with deep interrupted 3-0 Vicryl and skin was closed  with subcuticular suture of Monocryl 3-0.   Sentinel Node Biopsy Synoptic Operative Report  Operation performed with curative intent:Yes  Tracer(s) used to identify sentinel nodes in the upfront surgery  (non-neoadjuvant) setting (select all that apply):Radioactive Tracer  Tracer(s) used to identify sentinel nodes in the neoadjuvant setting (select all that apply):N/A  All nodes (colored or non-colored) present at the end of a dye-filled lymphatic channel were removed:N/A  All significantly radioactive nodes were removed:Yes  All palpable suspicious nodes were removed:N/A  Biopsy-proven positive nodes marked with clips prior to chemotherapy were identified and removed:N/A  Specimen: Right breast                     Left Breast                     Left axillary Sentinel lymph node.   Complications: None  Estimated Blood Loss: 100 mL

## 2024-01-19 ENCOUNTER — Other Ambulatory Visit: Payer: Self-pay

## 2024-01-19 ENCOUNTER — Encounter: Payer: Self-pay | Admitting: General Surgery

## 2024-01-19 ENCOUNTER — Encounter: Payer: Self-pay | Admitting: Oncology

## 2024-01-19 ENCOUNTER — Observation Stay

## 2024-01-19 DIAGNOSIS — C50919 Malignant neoplasm of unspecified site of unspecified female breast: Secondary | ICD-10-CM | POA: Diagnosis not present

## 2024-01-19 DIAGNOSIS — E1122 Type 2 diabetes mellitus with diabetic chronic kidney disease: Secondary | ICD-10-CM | POA: Diagnosis not present

## 2024-01-19 DIAGNOSIS — N186 End stage renal disease: Secondary | ICD-10-CM | POA: Diagnosis not present

## 2024-01-19 DIAGNOSIS — E1165 Type 2 diabetes mellitus with hyperglycemia: Secondary | ICD-10-CM | POA: Diagnosis not present

## 2024-01-19 DIAGNOSIS — Z794 Long term (current) use of insulin: Secondary | ICD-10-CM | POA: Diagnosis not present

## 2024-01-19 DIAGNOSIS — C50912 Malignant neoplasm of unspecified site of left female breast: Secondary | ICD-10-CM | POA: Diagnosis not present

## 2024-01-19 DIAGNOSIS — J449 Chronic obstructive pulmonary disease, unspecified: Secondary | ICD-10-CM | POA: Diagnosis not present

## 2024-01-19 DIAGNOSIS — D631 Anemia in chronic kidney disease: Secondary | ICD-10-CM | POA: Diagnosis not present

## 2024-01-19 LAB — CBC
HCT: 31.6 % — ABNORMAL LOW (ref 36.0–46.0)
Hemoglobin: 9.6 g/dL — ABNORMAL LOW (ref 12.0–15.0)
MCH: 30.5 pg (ref 26.0–34.0)
MCHC: 30.4 g/dL (ref 30.0–36.0)
MCV: 100.3 fL — ABNORMAL HIGH (ref 80.0–100.0)
Platelets: 451 K/uL — ABNORMAL HIGH (ref 150–400)
RBC: 3.15 MIL/uL — ABNORMAL LOW (ref 3.87–5.11)
RDW: 15.2 % (ref 11.5–15.5)
WBC: 12.1 K/uL — ABNORMAL HIGH (ref 4.0–10.5)
nRBC: 0 % (ref 0.0–0.2)

## 2024-01-19 LAB — RENAL FUNCTION PANEL
Albumin: 3.5 g/dL (ref 3.5–5.0)
Anion gap: 13 (ref 5–15)
BUN: 37 mg/dL — ABNORMAL HIGH (ref 8–23)
CO2: 24 mmol/L (ref 22–32)
Calcium: 8.5 mg/dL — ABNORMAL LOW (ref 8.9–10.3)
Chloride: 95 mmol/L — ABNORMAL LOW (ref 98–111)
Creatinine, Ser: 3.98 mg/dL — ABNORMAL HIGH (ref 0.44–1.00)
GFR, Estimated: 12 mL/min — ABNORMAL LOW (ref >60)
Glucose, Bld: 321 mg/dL — ABNORMAL HIGH (ref 70–99)
Phosphorus: 4.6 mg/dL (ref 2.5–4.6)
Potassium: 4.4 mmol/L (ref 3.5–5.1)
Sodium: 133 mmol/L — ABNORMAL LOW (ref 135–145)

## 2024-01-19 LAB — HEPATITIS B SURFACE ANTIGEN: Hepatitis B Surface Ag: NONREACTIVE

## 2024-01-19 LAB — GLUCOSE, CAPILLARY: Glucose-Capillary: 296 mg/dL — ABNORMAL HIGH (ref 70–99)

## 2024-01-19 MED ORDER — INSULIN GLARGINE 100 UNIT/ML ~~LOC~~ SOLN
35.0000 [IU] | Freq: Every day | SUBCUTANEOUS | Status: DC
  Filled 2024-01-19: qty 0.35

## 2024-01-19 MED ORDER — CHLORHEXIDINE GLUCONATE CLOTH 2 % EX PADS: 6.0000 | MEDICATED_PAD | Freq: Every day | CUTANEOUS | Status: DC

## 2024-01-19 MED ORDER — TRAMADOL HCL 50 MG PO TABS
50.0000 mg | ORAL_TABLET | Freq: Four times a day (QID) | ORAL | 0 refills | Status: DC | PRN
  Filled 2024-01-19: qty 20, 5d supply, fill #0

## 2024-01-19 MED ORDER — PENTAFLUOROPROP-TETRAFLUOROETH EX AERO
INHALATION_SPRAY | CUTANEOUS | Status: AC
  Filled 2024-01-19: qty 30

## 2024-01-19 NOTE — Plan of Care (Signed)
 Problem: Education: Goal: Knowledge of General Education information will improve Description: Including pain rating scale, medication(s)/side effects and non-pharmacologic comfort measures 01/19/2024 1537 by Collins Julietta GAILS, RN Outcome: Adequate for Discharge 01/19/2024 1537 by Collins Julietta GAILS, RN Outcome: Progressing   Problem: Health Behavior/Discharge Planning: Goal: Ability to manage health-related needs will improve 01/19/2024 1537 by Collins Julietta GAILS, RN Outcome: Adequate for Discharge 01/19/2024 1537 by Collins Julietta GAILS, RN Outcome: Progressing   Problem: Clinical Measurements: Goal: Ability to maintain clinical measurements within normal limits will improve 01/19/2024 1537 by Collins Julietta GAILS, RN Outcome: Adequate for Discharge 01/19/2024 1537 by Collins Julietta GAILS, RN Outcome: Progressing Goal: Will remain free from infection 01/19/2024 1537 by Collins Julietta GAILS, RN Outcome: Adequate for Discharge 01/19/2024 1537 by Collins Julietta GAILS, RN Outcome: Progressing Goal: Diagnostic test results will improve 01/19/2024 1537 by Collins Julietta GAILS, RN Outcome: Adequate for Discharge 01/19/2024 1537 by Collins Julietta GAILS, RN Outcome: Progressing Goal: Respiratory complications will improve 01/19/2024 1537 by Collins Julietta GAILS, RN Outcome: Adequate for Discharge 01/19/2024 1537 by Collins Julietta GAILS, RN Outcome: Progressing Goal: Cardiovascular complication will be avoided 01/19/2024 1537 by Collins Julietta GAILS, RN Outcome: Adequate for Discharge 01/19/2024 1537 by Collins Julietta GAILS, RN Outcome: Progressing   Problem: Activity: Goal: Risk for activity intolerance will decrease 01/19/2024 1537 by Collins Julietta GAILS, RN Outcome: Adequate for Discharge 01/19/2024 1537 by Collins Julietta GAILS, RN Outcome: Progressing   Problem: Nutrition: Goal: Adequate nutrition will be maintained 01/19/2024 1537 by Collins Julietta GAILS, RN Outcome:  Adequate for Discharge 01/19/2024 1537 by Collins Julietta GAILS, RN Outcome: Progressing   Problem: Coping: Goal: Level of anxiety will decrease 01/19/2024 1537 by Collins Julietta GAILS, RN Outcome: Adequate for Discharge 01/19/2024 1537 by Collins Julietta GAILS, RN Outcome: Progressing   Problem: Elimination: Goal: Will not experience complications related to bowel motility 01/19/2024 1537 by Collins Julietta GAILS, RN Outcome: Adequate for Discharge 01/19/2024 1537 by Collins Julietta GAILS, RN Outcome: Progressing Goal: Will not experience complications related to urinary retention 01/19/2024 1537 by Collins Julietta GAILS, RN Outcome: Adequate for Discharge 01/19/2024 1537 by Collins Julietta GAILS, RN Outcome: Progressing   Problem: Pain Managment: Goal: General experience of comfort will improve and/or be controlled 01/19/2024 1537 by Collins Julietta GAILS, RN Outcome: Adequate for Discharge 01/19/2024 1537 by Collins Julietta GAILS, RN Outcome: Progressing   Problem: Safety: Goal: Ability to remain free from injury will improve 01/19/2024 1537 by Collins Julietta GAILS, RN Outcome: Adequate for Discharge 01/19/2024 1537 by Collins Julietta GAILS, RN Outcome: Progressing   Problem: Skin Integrity: Goal: Risk for impaired skin integrity will decrease 01/19/2024 1537 by Collins Julietta GAILS, RN Outcome: Adequate for Discharge 01/19/2024 1537 by Collins Julietta GAILS, RN Outcome: Progressing   Problem: Education: Goal: Ability to describe self-care measures that may prevent or decrease complications (Diabetes Survival Skills Education) will improve 01/19/2024 1537 by Collins Julietta GAILS, RN Outcome: Adequate for Discharge 01/19/2024 1537 by Collins Julietta GAILS, RN Outcome: Progressing Goal: Individualized Educational Video(s) 01/19/2024 1537 by Collins Julietta GAILS, RN Outcome: Adequate for Discharge 01/19/2024 1537 by Collins Julietta GAILS, RN Outcome: Progressing   Problem: Coping: Goal:  Ability to adjust to condition or change in health will improve 01/19/2024 1537 by Collins Julietta GAILS, RN Outcome: Adequate for Discharge 01/19/2024 1537 by Collins Julietta GAILS, RN Outcome: Progressing   Problem: Fluid Volume: Goal: Ability to maintain a balanced intake and output will improve 01/19/2024 1537 by Collins Julietta GAILS, RN Outcome: Adequate for Discharge 01/19/2024  1537 by Collins Julietta GAILS, RN Outcome: Progressing   Problem: Health Behavior/Discharge Planning: Goal: Ability to identify and utilize available resources and services will improve 01/19/2024 1537 by Collins Julietta GAILS, RN Outcome: Adequate for Discharge 01/19/2024 1537 by Collins Julietta GAILS, RN Outcome: Progressing Goal: Ability to manage health-related needs will improve 01/19/2024 1537 by Collins Julietta GAILS, RN Outcome: Adequate for Discharge 01/19/2024 1537 by Collins Julietta GAILS, RN Outcome: Progressing   Problem: Metabolic: Goal: Ability to maintain appropriate glucose levels will improve 01/19/2024 1537 by Collins Julietta GAILS, RN Outcome: Adequate for Discharge 01/19/2024 1537 by Collins Julietta GAILS, RN Outcome: Progressing   Problem: Nutritional: Goal: Maintenance of adequate nutrition will improve 01/19/2024 1537 by Collins Julietta GAILS, RN Outcome: Adequate for Discharge 01/19/2024 1537 by Collins Julietta GAILS, RN Outcome: Progressing Goal: Progress toward achieving an optimal weight will improve 01/19/2024 1537 by Collins Julietta GAILS, RN Outcome: Adequate for Discharge 01/19/2024 1537 by Collins Julietta GAILS, RN Outcome: Progressing   Problem: Skin Integrity: Goal: Risk for impaired skin integrity will decrease 01/19/2024 1537 by Collins Julietta GAILS, RN Outcome: Adequate for Discharge 01/19/2024 1537 by Collins Julietta GAILS, RN Outcome: Progressing   Problem: Tissue Perfusion: Goal: Adequacy of tissue perfusion will improve 01/19/2024 1537 by Collins Julietta GAILS, RN Outcome:  Adequate for Discharge 01/19/2024 1537 by Collins Julietta GAILS, RN Outcome: Progressing

## 2024-01-19 NOTE — Discharge Instructions (Signed)
°  Diet: Resume home heart healthy renal diet.   Activity: No heavy lifting >20 pounds (children, pets, laundry, garbage) or strenuous activity until follow-up, but light activity and walking are encouraged. Do not drive or drink alcohol if taking narcotic pain medications.  Wound care: clean wounds with wet cloth with little bit of soap. Then keep it dry and apply binder.   Call office [phone]) at any time if any questions, worsening pain, fevers/chills, bleeding, drainage from incision site, or other concerns.

## 2024-01-19 NOTE — Plan of Care (Signed)

## 2024-01-19 NOTE — Discharge Summary (Signed)
 Patient ID: Jocelyn Wells MRN: [id number] DOB/AGE: [birth year] 69 y.o.  Admit date: 01/18/2024 Discharge date: 01/19/2024   Discharge Diagnoses:  Principal Problem:   Breast cancer (HCC) Active Problems:   COPD (chronic obstructive pulmonary disease) (HCC)   Uncontrolled type 2 diabetes mellitus with hyperglycemia, with long-term current use of insulin  (HCC)   Chronic hypoxic respiratory failure, on home oxygen  therapy [facility])   Procedures: Bilateral mastectomy with left axillary sentinel node biopsy  Hospital Course: Patient with multicentric left breast cancer.  She underwent bilateral mastectomy with left axillary sentinel node biopsy.  Patient with history of end-stage renal disease on hemodialysis.  She received dialysis Monday and Tuesday and had the surgery on Wednesday.  She is receiving dialysis today and discharged today after dialysis.  The drains with adequate output.  Physical exam without any sign of hematoma or seroma.  No sign of infection.  Physical Exam Vitals reviewed.  HENT:     Head: Normocephalic.  Cardiovascular:     Rate and Rhythm: Normal rate and regular rhythm.  Pulmonary:     Effort: Pulmonary effort is normal.  Chest:  Breasts:    Right: Absent.     Left: Absent.     Comments: No sign of hematoma or seroma Abdominal:     General: Abdomen is flat. Bowel sounds are normal.  Musculoskeletal:        General: Normal range of motion.     Cervical back: Normal range of motion.  Skin:    General: Skin is warm.     Capillary Refill: Capillary refill takes less than 2 seconds.  Neurological:     Mental Status: She is alert and oriented to person, place, and time.      Consults: Nephrology                   Internal medicine  Disposition: Discharge disposition: 01-Home or Self Care        Allergies as of 01/19/2024       Reactions   Ivp Dye [iodinated Contrast Media] Anaphylaxis   Kidney Failure   Codeine Nausea And Vomiting         Medication List     TAKE these medications    albuterol  108 (90 Base) MCG/ACT inhaler Commonly known as: VENTOLIN  HFA TAKE 2 PUFFS BY MOUTH EVERY 6 HOURS AS NEEDED FOR WHEEZE OR SHORTNESS OF BREATH   atorvastatin  40 MG tablet Commonly known as: LIPITOR Take 1 tablet (40 mg total) by mouth daily.   buPROPion  150 MG 12 hr tablet Commonly known as: WELLBUTRIN  SR Take 1 tablet (150 mg total) by mouth 2 (two) times daily.   calcium  carbonate 750 MG chewable tablet Commonly known as: TUMS EX Chew 1 tablet by mouth daily.   Eliquis  5 MG Tabs tablet Generic drug: apixaban  TAKE 1 TABLET TWICE A DAY   fluticasone  50 MCG/ACT nasal spray Commonly known as: FLONASE  Place 2 sprays into both nostrils daily. What changed:  when to take this reasons to take this   gabapentin  300 MG capsule Commonly known as: NEURONTIN  Take 300 mg by mouth daily.   hydroxyurea  500 MG capsule Commonly known as: HYDREA  TAKE 1 CAPSULE BY MOUTH EVERY OTHER DAY. MAY TAKE WITH FOOD TO MINIMIZE GI SIDE EFFECTS. What changed: See the new instructions.   insulin  aspart 100 UNIT/ML injection Commonly known as: novoLOG  Insulin  pump   insulin  pump Soln Per endocrinoloy   loratadine  10 MG tablet Commonly known as: CLARITIN  Take  10 mg by mouth daily.   meclizine  12.5 MG tablet Commonly known as: ANTIVERT  Take 1 tablet (12.5 mg total) by mouth 3 (three) times daily as needed for dizziness.   Omnipod DASH Pods (Gen 4) Misc USE 1 POD EACH EVERY 72    HOURS   optichamber diamond  Misc Use with inhaler to ensure medication is delivered throughout lungs   OXYGEN  Inhale 2 L into the lungs at bedtime.   PARoxetine  40 MG tablet Commonly known as: PAXIL  Take 1 tablet (40 mg total) by mouth daily.   torsemide  20 MG tablet Commonly known as: DEMADEX  TAKE 3 TABLETS BY MOUTH EVERY DAY What changed: how much to take   traMADol  50 MG tablet Commonly known as: ULTRAM  Take 1 tablet (50 mg total) by mouth 2  (two) times daily as needed. What changed: Another medication with the same name was added. Make sure you understand how and when to take each.   traMADol  50 MG tablet Commonly known as: Ultram  Take 1 tablet (50 mg total) by mouth every 6 (six) hours as needed. What changed: You were already taking a medication with the same name, and this prescription was added. Make sure you understand how and when to take each.   Vitamin D  (Ergocalciferol ) 1.25 MG (50000 UNIT) Caps capsule Commonly known as: DRISDOL  Take 50,000 Units by mouth once a week.        Follow-up Information     Rodolph Romano, MD Follow up in 1 week(s).   Specialty: General Surgery Contact information: [address] [place] [address] [phone]                 This discharge encounter was for 35-minute most of the time counseling the patient and coordinating plan of care.

## 2024-01-19 NOTE — Progress Notes (Signed)
 Hemodialysis Note:  Received patient in bed to unit. Alert and oriented. Informed consent singed and in chart.  Treatment initiated: 1035 Treatment completed: 1438  Access used: Left AVF Access issues: Cartridge changed due to system clot, unable to return blood. HD NP made aware  Patient tolerated well. Transported back to room, alert without acute distress. Report given to patient's RN.  Total UF removed: 1.5 Liters Medications given: None  Post HD weight: 89.2 Kg  [facility]

## 2024-01-19 NOTE — Progress Notes (Signed)
°  Progress Note   Patient: Jocelyn Wells FMW:[id number] DOB: 26-Apr-1955 DOA: 01/18/2024     0 DOS: the patient was seen and examined on 01/19/2024   Brief hospital course: Jocelyn Wells is a 68 y.o. female with past medical history of type 2 diabetes, history of stroke, end-stage renal disease on dialysis, chronic diastolic congestive heart failure, COPD and chronic respiratory failure with hypoxemia present to the hospital for elective bilateral mastectomy with a new diagnosis of breast cancer. Postoperatively, patient doing well, hospitalist consulted for management diabetes. Currently, pa   Principal Problem:   Breast cancer (HCC) Active Problems:   COPD (chronic obstructive pulmonary disease) (HCC)   Uncontrolled type 2 diabetes mellitus with hyperglycemia, with long-term current use of insulin  (HCC)   Chronic hypoxic respiratory failure, on home oxygen  therapy (HCC)   Assessment and Plan: Uncontrolled type 2 diabetes with hyperglycemia. Patient was placed on long-acting as well as scheduled and as needed short acting insulin . Patient is going home today, discussed with patient son, he can help manage the insulin  pump.  He will resume insulin  pump after arriving home.   End-stage renal disease on dialysis. Patient is dialyzed again today.  Follow-up with nephrology for additional dialysis.   History of stroke. Patient has some memory issues since had a stroke.  Could be developing vascular dementia.  Follow-up with PCP as outpatient.   COPD. Chronic hypoxemic respiratory failure. Condition stable, no bronchospasm.   Chronic diastolic congestive heart failure. No exacerbation. Recent echocardiogram performed 08/2022 showed ejection fraction 50 to 55% with grade 1 diastolic dysfunction.  Mild elevated pulmonary blood pressure.       Subjective:  Patient doing well today.  Pain controlled  Physical Exam: Vitals:   01/19/24 1035 01/19/24 1100 01/19/24 1130 01/19/24  1200  BP: (!) 121/55 113/69 (!) 127/49 (!) 116/54  Pulse: 66 79 76 75  Resp: 19 (!) 21 (!) 22 (!) 21  Temp:      TempSrc:      SpO2: 97% 97% 96% 95%  Weight:      Height:       General exam: Appears calm and comfortable  Respiratory system: Clear to auscultation. Respiratory effort normal. Cardiovascular system: S1 & S2 heard, RRR. No JVD, murmurs, rubs, gallops or clicks. No pedal edema. Gastrointestinal system: Abdomen is nondistended, soft and nontender. No organomegaly or masses felt. Normal bowel sounds heard. Central nervous system: Alert and oriented. No focal neurological deficits. Extremities: Symmetric 5 x 5 power. Skin: No rashes, lesions or ulcers Psychiatry: Judgement and insight appear normal. Mood & affect appropriate.    Data Reviewed:  Lab results reviewed.  Family Communication: Son updated  Disposition: Status is: Observation      Time spent: 35 minutes  Author: Murvin Mana, MD 01/19/2024 12:11 PM  For on call review [email or web address].

## 2024-01-19 NOTE — Progress Notes (Signed)
 [facility]  ROUNDING NOTE   Subjective:   Jocelyn Wells is a 68 y.o. female with past medical history of type 2 diabetes, CVA, diastolic heart failure, COPD, and end stage renal disease on hemodialysis. Patient presents for scheduled total bilateral mastectomy.   Patient is known to our clinic and receives outpatient dialysis treatments at [facility] on a MWF schedule. In preparation for this procedure, patient received dialysis on Monday and Tuesday. Her procedure when well on 12/10. She complains of pain today. Denies shortness of breath, no signs of respiratory distress.   Labs appear stable for renal patient.   We have been consulted to manage dialysis needs.    Objective:  Vital signs in last 24 hours:  Temp:  [97.8 F (36.6 C)-98.4 F (36.9 C)] 97.9 F (36.6 C) (12/11 1017) Pulse Rate:  [66-85] 74 (12/11 1300) Resp:  [16-28] 26 (12/11 1300) BP: (107-134)/(48-114) 107/54 (12/11 1300) SpO2:  [90 %-97 %] 92 % (12/11 1300) Weight:  [90.7 kg-91.2 kg] 90.7 kg (12/11 1017)  Weight change:  Filed Weights   01/18/24 1742 01/19/24 1017  Weight: 91.2 kg 90.7 kg    Intake/Output: I/O last 3 completed shifts: In: 1112.8 [P.O.:540; I.V.:372.8; IV Piggyback:200] Out: 155 [Drains:130; Blood:25]   Intake/Output this shift:  No intake/output data recorded.  Physical Exam: General: NAD  Head: Normocephalic, atraumatic. Moist oral mucosal membranes  Eyes: Anicteric  Lungs:  Clear to auscultation, normal effort  Heart: Regular rate and rhythm  Abdomen:  Soft, nontender  Extremities:  No peripheral edema.  Neurologic: Awake, alert, conversant  Skin: Warm,dry, no rash, Chest, surgical dressing with JP drains  Access: Lt upper AVF    Basic Metabolic Panel: Recent Labs  Lab 01/13/24 1309 01/18/24 0922 01/19/24 0447  NA 137 137 133*  K 3.9 3.6 4.4  CL 97* 98 95*  CO2 28  --  24  GLUCOSE 227* 160* 321*  BUN 37* 27* 37*  CREATININE 3.99* 3.70* 3.98*   CALCIUM  8.8*  --  8.5*  PHOS  --   --  4.6    Liver Function Tests: Recent Labs  Lab 01/19/24 0447  ALBUMIN  3.5   No results for input(s): LIPASE, AMYLASE in the last 168 hours. No results for input(s): AMMONIA in the last 168 hours.  CBC: Recent Labs  Lab 01/13/24 1309 01/18/24 0922 01/19/24 0447  WBC 12.5*  --  12.1*  HGB 11.9* 12.6 9.6*  HCT 37.6 37.0 31.6*  MCV 95.9  --  100.3*  PLT 547*  --  451*    Cardiac Enzymes: No results for input(s): CKTOTAL, CKMB, CKMBINDEX, TROPONINI in the last 168 hours.  BNP: Invalid input(s): POCBNP  CBG: Recent Labs  Lab 01/18/24 1237 01/18/24 1411 01/18/24 1651 01/18/24 2145 01/19/24 0735  GLUCAP 255* 244* 141* 397* 296*    Microbiology: Results for orders placed or performed during the hospital encounter of 06/29/23  Resp panel by RT-PCR (RSV, Flu A&B, Covid) Anterior Nasal Swab     Status: None   Collection Time: 06/29/23  7:07 PM   Specimen: Anterior Nasal Swab  Result Value Ref Range Status   SARS Coronavirus 2 by RT PCR NEGATIVE NEGATIVE Final    Comment: (NOTE) SARS-CoV-2 target nucleic acids are NOT DETECTED.  The SARS-CoV-2 RNA is generally detectable in upper respiratory specimens during the acute phase of infection. The lowest concentration of SARS-CoV-2 viral copies this assay can detect is 138 copies/mL. A negative result does not preclude  SARS-Cov-2 infection and should not be used as the sole basis for treatment or other patient management decisions. A negative result may occur with  improper specimen collection/handling, submission of specimen other than nasopharyngeal swab, presence of viral mutation(s) within the areas targeted by this assay, and inadequate number of viral copies(<138 copies/mL). A negative result must be combined with clinical observations, patient history, and epidemiological information. The expected result is Negative.  Fact Sheet for Patients:   bloggercourse.com  Fact Sheet for Healthcare Providers:  seriousbroker.it  This test is no t yet approved or cleared by the [place]  FDA and  has been authorized for detection and/or diagnosis of SARS-CoV-2 by FDA under an Emergency Use Authorization (EUA). This EUA will remain  in effect (meaning this test can be used) for the duration of the COVID-19 declaration under Section 564(b)(1) of the Act, 21 U.S.C.section 360bbb-3(b)(1), unless the authorization is terminated  or revoked sooner.       Influenza A by PCR NEGATIVE NEGATIVE Final   Influenza B by PCR NEGATIVE NEGATIVE Final    Comment: (NOTE) The Xpert Xpress SARS-CoV-2/FLU/RSV plus assay is intended as an aid in the diagnosis of influenza from Nasopharyngeal swab specimens and should not be used as a sole basis for treatment. Nasal washings and aspirates are unacceptable for Xpert Xpress SARS-CoV-2/FLU/RSV testing.  Fact Sheet for Patients: bloggercourse.com  Fact Sheet for Healthcare Providers: seriousbroker.it  This test is not yet approved or cleared by the [place]  FDA and has been authorized for detection and/or diagnosis of SARS-CoV-2 by FDA under an Emergency Use Authorization (EUA). This EUA will remain in effect (meaning this test can be used) for the duration of the COVID-19 declaration under Section 564(b)(1) of the Act, 21 U.S.C. section 360bbb-3(b)(1), unless the authorization is terminated or revoked.     Resp Syncytial Virus by PCR NEGATIVE NEGATIVE Final    Comment: (NOTE) Fact Sheet for Patients: bloggercourse.com  Fact Sheet for Healthcare Providers: seriousbroker.it  This test is not yet approved or cleared by the [place]  FDA and has been authorized for detection and/or diagnosis of SARS-CoV-2 by FDA under an Emergency Use  Authorization (EUA). This EUA will remain in effect (meaning this test can be used) for the duration of the COVID-19 declaration under Section 564(b)(1) of the Act, 21 U.S.C. section 360bbb-3(b)(1), unless the authorization is terminated or revoked.  Performed at [facility], [address] [place], [place] [address]    *Note: Due to a large number of results and/or encounters for the requested time period, some results have not been displayed. A complete set of results can be found in Results Review.    Coagulation Studies: No results for input(s): LABPROT, INR in the last 72 hours.  Urinalysis: No results for input(s): COLORURINE, LABSPEC, PHURINE, GLUCOSEU, HGBUR, BILIRUBINUR, KETONESUR, PROTEINUR, UROBILINOGEN, NITRITE, LEUKOCYTESUR in the last 72 hours.  Invalid input(s): APPERANCEUR    Imaging: DG BREAST SURGICAL SPECIMEN NO CHARGE Result Date: 01/19/2024 This procedure is a no report and no charge.  It will auto finalize.  NM Sentinel Node Inj-No Rpt (Breast) Result Date: 01/18/2024 Lymphoseek was injected by the Nuclear Medicine Technologist for sentinel lymph node localization.     Medications:     buPROPion   150 mg Oral BID   calcium  carbonate  1 tablet Oral Daily   Chlorhexidine  Gluconate Cloth  6 each Topical Q0600   gabapentin   300 mg Oral Daily   heparin   5,000 Units Subcutaneous Q8H  hydroxyurea   500 mg Oral Daily   insulin  aspart  0-5 Units Subcutaneous QHS   insulin  aspart  0-9 Units Subcutaneous TID WC   insulin  aspart  4 Units Subcutaneous TID WC   insulin  glargine  35 Units Subcutaneous QHS   loratadine   10 mg Oral Daily   pantoprazole  (PROTONIX ) IV  40 mg Intravenous QHS   PARoxetine   40 mg Oral Daily   torsemide   40 mg Oral Daily   Vitamin D  (Ergocalciferol )  50,000 Units Oral Weekly   albuterol , meclizine , morphine  injection, ondansetron  **OR** ondansetron  (ZOFRAN ) IV, traMADol   Assessment/ Plan:   Jocelyn Wells is a 68 y.o.  female with past medical history of type 2 diabetes, CVA, diastolic heart failure, COPD, and end stage renal disease on hemodialysis. Patient presents for scheduled total bilateral mastectomy.   [facility]/MWF/Lt upper AVF  Anemia of chronic kidney disease Lab Results  Component Value Date   HGB 9.6 (L) 01/19/2024   Hgb stable on admission. Decreased to 9.6 after surgical procedure. Will hold ESA due to recent breast cancer.   2. End stage renal disease on hemodialysis, last treatment completed on Tuesday. Patient unsure she will get to dialysis tomorrow due to discomfort. Agreeable to treatment today. Patient encouraged to maintain outpatient dialysis scheduled for Friday.   3. Mastectomy, total bilateral. Completed on 12/10. General surgery to followup outpatient.   4. Diabetes mellitus type II with chronic kidney disease/renal manifestations: insulin  dependent. Home regimen includes Novolog . Most recent hemoglobin A1c is 12.2 on 01/18/24.       LOS: 0 Lluvia Gwynne 12/11/20251:23 PM

## 2024-01-19 NOTE — Inpatient Diabetes Management (Signed)
 Inpatient Diabetes Program Recommendations  AACE/ADA: New Consensus Statement on Inpatient Glycemic Control   Target Ranges:  Prepandial:   less than 140 mg/dL      Peak postprandial:   less than 180 mg/dL (1-2 hours)      Critically ill patients:  140 - 180 mg/dL    Latest Reference Range & Units 01/18/24 12:37 01/18/24 14:11 01/18/24 16:51 01/18/24 21:45 01/19/24 07:35  Glucose-Capillary 70 - 99 mg/dL 744 (H) 755 (H) 858 (H) 397 (H) 296 (H)   Review of Glycemic Control  Diabetes history: DM2 Outpatient Diabetes medications: OmniPod insulin  pump and FreeStyle Libre 2 CGM Current orders for Inpatient glycemic control: Lantus  35 units at bedtime, Novolog  4 units TID with meals, Novolog  0-9 units TID with meals, Novolog  0-5 units QHS   Inpatient Diabetes Program Recommendations:     Insulin : Patient was given Lantus  25 units last night and CBG 296 mg/dl this morning. Noted Lantus  increased to 35 units at bedtime today and Novolog  meal coverage added on 01/18/24 to start this morning.  HbgA1C: A1C 8.1% on 10/14/23 in Care Everywhere.  NOTE: Patient had surgery on 01/18/24.   Per chart review, patient has DM2, uses an OmniPod insulin  pump and FreeStyle Libre 2 CGM sensor, and sees Dr. Damian (Endocrinologist) for DM control. Patient last seen Dr. Damian on 01/03/24 and per office note pump settings should be:  Basal rate 12 AM: 1.2 units/hr  12 PM: 1.55 units/hr 24hr basal = 33 units  Bolus settings Sensitivity 20 I:C ratio 7.5 Target blood glucose 120  Inpatient diabetes coordinator spoke with patient and family at bedside on 01/18/24. Patient was very drowsy and not appropriate to use her insulin  pump at that time so it was recommended that insulin  pump be removed and patient be ordered basal insulin  and Novolog  correction Q4H.  Patient was ordered Novolog  correction scale at 16:43 on 01/18/24. Unable to tell when insulin  pump was stopped as there is no documentation to reflect. CBG up to  397 mg/dl at 78:54 and patient did not receive Semglee  until 21:53 on 01/18/24. Question if insulin  pump was removed when Novolog  correction scale ordered and patient was not given basal insulin  at that time so CBGs increased significantly.  Will follow along and see how glucose trends with current insulin  orders.  Thanks, Earnie Gainer, RN, MSN, CDCES Diabetes Coordinator Inpatient Diabetes Program [phone] (Team Pager from 8am to 5pm)

## 2024-01-19 NOTE — Progress Notes (Signed)
 Pt receives outpt HD at [facility] TTS 10:15 chair time. Navigator following to assist with any HD needs.  Suzen Satchel Dialysis Navigator [phone]

## 2024-01-19 NOTE — TOC CM/SW Note (Signed)
°  Transition of Care (TOC) Screening Note   Patient Details  Name: Jocelyn Wells Date of Birth: May 24, 1955   Transition of Care [facility]) CM/SW Contact:    Corean ONEIDA Haddock, RN Phone Number: 01/19/2024, 3:45 PM    Transition of Care Department [facility]) has reviewed patient and no TOC needs have been identified at this time. . If new patient transition needs arise, please place a TOC consult.    Patient has been weaned to 2L which is her baseline per nursing

## 2024-01-20 LAB — SURGICAL PATHOLOGY

## 2024-01-20 LAB — HEPATITIS B SURFACE ANTIBODY, QUANTITATIVE: Hep B S AB Quant (Post): 29.1 m[IU]/mL

## 2024-01-21 ENCOUNTER — Encounter: Payer: Self-pay | Admitting: Emergency Medicine

## 2024-01-21 ENCOUNTER — Other Ambulatory Visit: Payer: Self-pay

## 2024-01-21 ENCOUNTER — Emergency Department
Admission: EM | Admit: 2024-01-21 | Discharge: 2024-01-21 | Disposition: A | Discharge status: Home / Self Care | Attending: Emergency Medicine | Admitting: Emergency Medicine

## 2024-01-21 DIAGNOSIS — N186 End stage renal disease: Secondary | ICD-10-CM | POA: Insufficient documentation

## 2024-01-21 DIAGNOSIS — L7682 Other postprocedural complications of skin and subcutaneous tissue: Secondary | ICD-10-CM | POA: Insufficient documentation

## 2024-01-21 DIAGNOSIS — Z8673 Personal history of transient ischemic attack (TIA), and cerebral infarction without residual deficits: Secondary | ICD-10-CM | POA: Insufficient documentation

## 2024-01-21 DIAGNOSIS — Z853 Personal history of malignant neoplasm of breast: Secondary | ICD-10-CM | POA: Diagnosis not present

## 2024-01-21 DIAGNOSIS — Z992 Dependence on renal dialysis: Secondary | ICD-10-CM | POA: Insufficient documentation

## 2024-01-21 DIAGNOSIS — I120 Hypertensive chronic kidney disease with stage 5 chronic kidney disease or end stage renal disease: Secondary | ICD-10-CM | POA: Diagnosis not present

## 2024-01-21 LAB — COMPREHENSIVE METABOLIC PANEL WITH GFR
ALT: <5 U/L (ref 0–44)
AST: 19 U/L (ref 15–41)
Albumin: 3.9 g/dL (ref 3.5–5.0)
Alkaline Phosphatase: 62 U/L (ref 38–126)
Anion gap: 12 (ref 5–15)
BUN: 42 mg/dL — ABNORMAL HIGH (ref 8–23)
CO2: 27 mmol/L (ref 22–32)
Calcium: 8.7 mg/dL — ABNORMAL LOW (ref 8.9–10.3)
Chloride: 94 mmol/L — ABNORMAL LOW (ref 98–111)
Creatinine, Ser: 4.89 mg/dL — ABNORMAL HIGH (ref 0.44–1.00)
GFR, Estimated: 9 mL/min — ABNORMAL LOW (ref >60)
Glucose, Bld: 199 mg/dL — ABNORMAL HIGH (ref 70–99)
Potassium: 3.9 mmol/L (ref 3.5–5.1)
Sodium: 133 mmol/L — ABNORMAL LOW (ref 135–145)
Total Bilirubin: 0.3 mg/dL (ref 0.0–1.2)
Total Protein: 6.6 g/dL (ref 6.5–8.1)

## 2024-01-21 LAB — CBC
HCT: 31.6 % — ABNORMAL LOW (ref 36.0–46.0)
Hemoglobin: 9.6 g/dL — ABNORMAL LOW (ref 12.0–15.0)
MCH: 30.2 pg (ref 26.0–34.0)
MCHC: 30.4 g/dL (ref 30.0–36.0)
MCV: 99.4 fL (ref 80.0–100.0)
Platelets: 415 K/uL — ABNORMAL HIGH (ref 150–400)
RBC: 3.18 MIL/uL — ABNORMAL LOW (ref 3.87–5.11)
RDW: 15.7 % — ABNORMAL HIGH (ref 11.5–15.5)
WBC: 13.8 K/uL — ABNORMAL HIGH (ref 4.0–10.5)
nRBC: 0 % (ref 0.0–0.2)

## 2024-01-21 NOTE — ED Provider Notes (Signed)
 [facility] Provider Note    Event Date/Time   First MD Initiated Contact with Patient 01/21/24 0202     (approximate)  History   Chief Complaint: Post-op Problem  HPI  Jocelyn Wells is a 68 y.o. female with a past medical history of prior CVA, anxiety, end-stage renal disease hemodialysis Monday/Wednesday/Friday, hypertension, breast cancer, right mastectomy performed 2 days ago presents to the emergency department for dislodged JP drain.  According to the patient and record review patient was discharged yesterday after a surgery on Wednesday for a right mastectomy.  Patient underwent dialysis on Thursday but normally she is Monday/Wednesday/Friday.  Patient denies any shortness of breath.  She is prescribed 2 L of oxygen  to be used as needed and at night.  Was noted to be mildly hypoxic on room air but satting in the upper 90s 98% on 2 L.  Patient denies any shortness of breath.  Physical Exam   Triage Vital Signs: ED Triage Vitals  Encounter Vitals Group     BP 01/21/24 0149 132/69     Girls Systolic BP Percentile --      Girls Diastolic BP Percentile --      Boys Systolic BP Percentile --      Boys Diastolic BP Percentile --      Pulse Rate 01/21/24 0149 82     Resp 01/21/24 0149 (!) 21     Temp 01/21/24 0149 98.4 F (36.9 C)     Temp Source 01/21/24 0149 Oral     SpO2 01/21/24 0149 (!) 85 %     Weight 01/21/24 0150 196 lb 10.4 oz (89.2 kg)     Height 01/21/24 0150 5' 6 (1.676 m)     Head Circumference --      Peak Flow --      Pain Score 01/21/24 0149 5     Pain Loc --      Pain Education --      Exclude from Growth Chart --     Most recent vital signs: Vitals:   01/21/24 0149 01/21/24 0154  BP: 132/69   Pulse: 82   Resp: (!) 21   Temp: 98.4 F (36.9 C)   SpO2: (!) 85% 98%    General: Awake, no distress.  CV:  Good peripheral perfusion.  Regular rate and rhythm  Resp:  Normal effort.  Equal breath sounds bilaterally.  Abd:  No  distention.  Soft, nontender.  No rebound or guarding.  ED Results / Procedures / Treatments   MEDICATIONS ORDERED IN ED: Medications - No data to display   IMPRESSION / MDM / ASSESSMENT AND PLAN / ED COURSE  I reviewed the triage vital signs and the nursing notes.  Patient's presentation is most consistent with acute presentation with potential threat to life or bodily function.  Patient presents emergency department for dislodged right JP drain.  Patient had a mastectomy performed on the right side 2 days ago.  Had dialysis performed yesterday.  Was noted to be mildly hypoxic however satting in the upper 90s on 2 L patient is prescribed 2 L as needed at home.  No shortness of breath.  Given her dialysis status we will check basic labs as a precaution.  Patient insertion site of the JP drain is well-appearing there is a mild amount of serosanguineous drainage from the insertion site.  The JP drain is completely gone on the right side.  The left side drain appears to be functioning properly.  We will discuss with general surgery for recommendations.  Lab work is reassuring.  Reassuring CBC, chemistry shows a normal potassium.  I spoke to Dr. Cesar, he recommends bulky dressings and changing them as needed.  He will follow-up in the office with the patient.  Patient is agreeable to this plan.  FINAL CLINICAL IMPRESSION(S) / ED DIAGNOSES   Postoperative complication    Note:  This document was prepared using Dragon voice recognition software and may include unintentional dictation errors.   Dorothyann Drivers, MD 01/21/24 [phone]

## 2024-01-21 NOTE — ED Triage Notes (Signed)
 Pt also states is a dialysis patient, last had dialysis on Thursday. Of note, pt's RA sats 85%, states is only supposed to wear O2 at night, pt placed on 2L, sats up to 98%

## 2024-01-21 NOTE — Discharge Instructions (Signed)
 Please change the dressing as needed.  Please call Dr. Boone office to arrange a follow-up appointment this week.  Return to the emergency department for any symptom concerning to yourself.

## 2024-01-21 NOTE — ED Triage Notes (Signed)
 Pt to ED via POV with c/o pulling out her JP drain. Pt states had a double mastectomy on Wednesday and had bilateral drains placed, the JP drain on the right side was attached to her pants and when she went to go to the bathroom she pulled it out. Pt's shirt noted to have blood/bodily fluids from R surgical site. Also of note, L side JP drain does not appear to be charged.

## 2024-01-23 ENCOUNTER — Ambulatory Visit: Admitting: Family Medicine

## 2024-01-24 ENCOUNTER — Inpatient Hospital Stay: Attending: Oncology

## 2024-01-24 ENCOUNTER — Inpatient Hospital Stay: Admitting: Oncology

## 2024-01-24 DIAGNOSIS — Z993 Dependence on wheelchair: Secondary | ICD-10-CM | POA: Insufficient documentation

## 2024-01-24 DIAGNOSIS — Z79899 Other long term (current) drug therapy: Secondary | ICD-10-CM | POA: Insufficient documentation

## 2024-01-24 DIAGNOSIS — Z170 Estrogen receptor positive status [ER+]: Secondary | ICD-10-CM | POA: Insufficient documentation

## 2024-01-24 DIAGNOSIS — I132 Hypertensive heart and chronic kidney disease with heart failure and with stage 5 chronic kidney disease, or end stage renal disease: Secondary | ICD-10-CM | POA: Insufficient documentation

## 2024-01-24 DIAGNOSIS — N186 End stage renal disease: Secondary | ICD-10-CM | POA: Insufficient documentation

## 2024-01-24 DIAGNOSIS — Z9013 Acquired absence of bilateral breasts and nipples: Secondary | ICD-10-CM | POA: Insufficient documentation

## 2024-01-24 DIAGNOSIS — C50812 Malignant neoplasm of overlapping sites of left female breast: Secondary | ICD-10-CM | POA: Insufficient documentation

## 2024-01-24 DIAGNOSIS — Z8673 Personal history of transient ischemic attack (TIA), and cerebral infarction without residual deficits: Secondary | ICD-10-CM | POA: Insufficient documentation

## 2024-01-24 DIAGNOSIS — F17210 Nicotine dependence, cigarettes, uncomplicated: Secondary | ICD-10-CM | POA: Insufficient documentation

## 2024-01-24 DIAGNOSIS — Z809 Family history of malignant neoplasm, unspecified: Secondary | ICD-10-CM | POA: Insufficient documentation

## 2024-01-24 DIAGNOSIS — Z1721 Progesterone receptor positive status: Secondary | ICD-10-CM | POA: Insufficient documentation

## 2024-01-24 DIAGNOSIS — Z1732 Human epidermal growth factor receptor 2 negative status: Secondary | ICD-10-CM | POA: Insufficient documentation

## 2024-01-24 DIAGNOSIS — Z992 Dependence on renal dialysis: Secondary | ICD-10-CM | POA: Insufficient documentation

## 2024-01-24 DIAGNOSIS — D75839 Thrombocytosis, unspecified: Secondary | ICD-10-CM | POA: Insufficient documentation

## 2024-01-25 ENCOUNTER — Ambulatory Visit

## 2024-01-31 ENCOUNTER — Encounter: Payer: Self-pay | Admitting: *Deleted

## 2024-02-01 ENCOUNTER — Inpatient Hospital Stay: Admitting: Oncology

## 2024-02-01 ENCOUNTER — Encounter: Payer: Self-pay | Admitting: Oncology

## 2024-02-01 VITALS — BP 129/63 | HR 70 | Temp 97.1°F | Resp 20 | Ht 66.0 in | Wt 203.6 lb

## 2024-02-01 DIAGNOSIS — Z9013 Acquired absence of bilateral breasts and nipples: Secondary | ICD-10-CM | POA: Diagnosis not present

## 2024-02-01 DIAGNOSIS — D75839 Thrombocytosis, unspecified: Secondary | ICD-10-CM | POA: Diagnosis not present

## 2024-02-01 DIAGNOSIS — Z993 Dependence on wheelchair: Secondary | ICD-10-CM | POA: Diagnosis not present

## 2024-02-01 DIAGNOSIS — Z170 Estrogen receptor positive status [ER+]: Secondary | ICD-10-CM

## 2024-02-01 DIAGNOSIS — Z7189 Other specified counseling: Secondary | ICD-10-CM

## 2024-02-01 DIAGNOSIS — C50812 Malignant neoplasm of overlapping sites of left female breast: Secondary | ICD-10-CM | POA: Diagnosis not present

## 2024-02-01 DIAGNOSIS — N186 End stage renal disease: Secondary | ICD-10-CM | POA: Diagnosis not present

## 2024-02-01 DIAGNOSIS — F17210 Nicotine dependence, cigarettes, uncomplicated: Secondary | ICD-10-CM | POA: Diagnosis not present

## 2024-02-01 DIAGNOSIS — Z992 Dependence on renal dialysis: Secondary | ICD-10-CM | POA: Diagnosis not present

## 2024-02-01 DIAGNOSIS — Z1721 Progesterone receptor positive status: Secondary | ICD-10-CM | POA: Diagnosis not present

## 2024-02-01 DIAGNOSIS — C50919 Malignant neoplasm of unspecified site of unspecified female breast: Secondary | ICD-10-CM

## 2024-02-01 DIAGNOSIS — Z79899 Other long term (current) drug therapy: Secondary | ICD-10-CM | POA: Diagnosis not present

## 2024-02-01 DIAGNOSIS — Z1732 Human epidermal growth factor receptor 2 negative status: Secondary | ICD-10-CM | POA: Diagnosis not present

## 2024-02-01 DIAGNOSIS — Z8673 Personal history of transient ischemic attack (TIA), and cerebral infarction without residual deficits: Secondary | ICD-10-CM | POA: Diagnosis not present

## 2024-02-01 DIAGNOSIS — C50011 Malignant neoplasm of nipple and areola, right female breast: Secondary | ICD-10-CM | POA: Diagnosis not present

## 2024-02-01 DIAGNOSIS — Z809 Family history of malignant neoplasm, unspecified: Secondary | ICD-10-CM | POA: Diagnosis not present

## 2024-02-01 DIAGNOSIS — I132 Hypertensive heart and chronic kidney disease with heart failure and with stage 5 chronic kidney disease, or end stage renal disease: Secondary | ICD-10-CM | POA: Diagnosis not present

## 2024-02-01 NOTE — Progress Notes (Signed)
 Patient has some concerns to address today.

## 2024-02-04 ENCOUNTER — Encounter: Payer: Self-pay | Admitting: Oncology

## 2024-02-04 MED ORDER — ONDANSETRON HCL 8 MG PO TABS: 8.0000 mg | ORAL_TABLET | Freq: Three times a day (TID) | ORAL | 1 refills | Status: AC | PRN

## 2024-02-04 MED ORDER — DEXAMETHASONE 4 MG PO TABS: ORAL_TABLET | ORAL | 1 refills | Status: AC

## 2024-02-04 MED ORDER — LIDOCAINE-PRILOCAINE 2.5-2.5 % EX CREA: TOPICAL_CREAM | CUTANEOUS | 3 refills | Status: AC

## 2024-02-04 MED ORDER — PROCHLORPERAZINE MALEATE 10 MG PO TABS: 10.0000 mg | ORAL_TABLET | Freq: Four times a day (QID) | ORAL | 1 refills | Status: AC | PRN

## 2024-02-04 NOTE — Progress Notes (Signed)
 "    Hematology/Oncology Consult note [facility]  [phone] [phone]  Patient Care Team: Donzella Lauraine SAILOR, DO as PCP - General (Family Medicine) Darliss Rogue, MD as PCP - Cardiology (Cardiology) Solum, Therisa HERO, MD as Physician Assistant (Endocrinology) Mohammed Goldstein, MD (Inactive) as Referring Physician (Psychiatry) Hester Alm BROCKS, MD as Consulting Physician (Dermatology) Dimmig, Debby, MD as Referring Physician (Orthopedic Surgery) Schnier, Cordella MATSU, MD (Vascular Surgery) Erasmo Bernardino BRAVO, OD (Optometry) Tamea Dedra CROME, MD as Consulting Physician (Pulmonary Disease) Melanee Annah BROCKS, MD as Consulting Physician (Oncology) Georgina Shasta POUR, RN as Oncology Nurse Navigator Rodolph Romano, MD as Consulting Physician (General Surgery) Douglas Rule, MD as Consulting Physician (Nephrology) Neill Boas, DPM (Podiatry) Patel, Seema K, NP as Nurse Practitioner (Pain Medicine) Gerard Frederick, NP as Nurse Practitioner (Cardiology)   Name of the patient: Jocelyn Wells  [id number]  January 17, 1956   Date of visit: 02/04/2024  Diagnosis-  Cancer Staging  Breast cancer [facility]) Staging form: Breast, AJCC 8th Edition - Pathologic stage from 02/04/2024: Stage IB (pT3, pN1a, cM0, G2, ER+, PR+, HER2-) - Signed by Melanee Annah BROCKS, MD on 02/04/2024 Stage prefix: Initial diagnosis Multigene prognostic tests performed: None Histologic grading system: 3 grade system    Chief complaint/ Reason for visit- discuss pathology results and further management  Heme/Onc history: Patient is a 68 year old female with a past medical history significant for ESRD on dialysis, hypertension hyperlipidemia COPD, history of stroke currently on Eliquis .  She has seen me in the past for iron  deficiency anemia and has required IV iron .Patient had an EGD and colonoscopy in January 2024 by Dr. Unk we did not show any evidence of bleeding.  Endoscopy revealed mild chronic  gastritis that was negative for H. pylori.She also had a capsule study which did not show any active bleeding   Patient was also diagnosed with JAK2 positive thrombocytosis likely essential thrombocytosis for which she is on Hydrea    Patient self palpated a left breast mass which led to bilateral diagnostic mammogram On 12/14/2023.  This did not show any evidence of malignancy in the right breast but she was found to have multifocal left breast malignancy.  17 x 19 x 18 mm mass at the 4 o'clock position 3 cm from the nipple.  11 x 8 x 7 mm mass at the 8 o'clock position 3 cm from the nipple.  8 mm mass 4 cm from the nipple at the 9:30 position.  10 mm irregular hypoechoic mass at 9 o'clock position 7 cm from the nipple.  Targeted ultrasound of the left axilla did not show any evidence of abnormal lymph nodes.  Patient had a core biopsy of 2 of the left breast masses at the 4:00 and 8 o'clock position and both of them were invasive mammary carcinoma with mucinous and micropapillary features overall grade 2.  Tumor was strongly ER/PR +100% and HER2 +1 negative.  Patient underwent b/ mastectomy without reconstruction on 01/18/24. Right breast pathology showed no malignancy. Left mastectomy pathology showed 7.2 cm grade 2 IDC with negative margins. 1/ 1 SLN positive for malignancy with ENE  Interval history- Discussed the use of AI scribe software for clinical note transcription with the patient, who gave verbal consent to proceed.  History of Present Illness   Jocelyn Wells is a 68 year old female with ER/PR+, HER2- left breast cancer status post bilateral mastectomy who presents for review of surgical pathology and discussion of adjuvant therapy.   She is currently recovering from  surgery with surgical drains in place, which continue to drain fluid without obstruction. The mastectomy site is healing well, and she has not experienced postoperative complications. She is awaiting complete wound healing and  drain removal prior to initiation of further therapy.  She has mild peripheral neuropathy in her hands and feet, managed by her pain medicine physician, which is not currently worsening. She is currently taking hydroxyurea  as part of her ongoing medical regimen.  She has heart failure with preserved ejection fraction and peripheral arterial disease, both managed by her cardiologist. These comorbidities were considered in the selection of her chemotherapy regimen to minimize cardiac risk.      ECOG PS- 2 Pain scale- 0   Review of systems- Review of Systems  Constitutional:  Positive for malaise/fatigue. Negative for chills, fever and weight loss.  HENT:  Negative for congestion, ear discharge and nosebleeds.   Eyes:  Negative for blurred vision.  Respiratory:  Negative for cough, hemoptysis, sputum production, shortness of breath and wheezing.   Cardiovascular:  Negative for chest pain, palpitations, orthopnea and claudication.  Gastrointestinal:  Negative for abdominal pain, blood in stool, constipation, diarrhea, heartburn, melena, nausea and vomiting.  Genitourinary:  Negative for dysuria, flank pain, frequency, hematuria and urgency.  Musculoskeletal:  Negative for back pain, joint pain and myalgias.  Skin:  Negative for rash.  Neurological:  Negative for dizziness, tingling, focal weakness, seizures, weakness and headaches.  Endo/Heme/Allergies:  Does not bruise/bleed easily.  Psychiatric/Behavioral:  Negative for depression and suicidal ideas. The patient does not have insomnia.       Allergies[1]   Past Medical History:  Diagnosis Date   Acute ischemic left MCA stroke (HCC) 01/29/2015   a.) MRI brain 01/29/2015: acute left MCA territory infarcts involving the posterior left frontal lobe and left insular cortex   Allergy    Anemia of chronic renal failure    Anxiety    Aortic atherosclerosis    Arthritis    Complication of anesthesia    a.) PONV; b.) awareness under  anesthesia   COPD (chronic obstructive pulmonary disease) (HCC)    Depression    Dyspnea    ESRD (end stage renal disease) on dialysis (M-W-F)    Headache    HFrEF (heart failure with reduced ejection fraction) (HCC)    a.) TTE 01/30/2015: EF 30-35%, diff HK, LAE, G2DD; b.) TTE 06/13/2015: EF 40-45%, diff HK, G1DD; c.) TTE 10/06/2017: EF 60-65% MAC, no IAS; d.) TTE 09/29/2021: EF 45-50%;, LVH, LAE, AoV sclerosis, G1DD; e.) TTE 02/24/2022: EF 60-65%, no IAS   HTN (hypertension)    Leukocytosis    Malignant neoplasm of left breast (HCC)    NICM (nonischemic cardiomyopathy) (HCC)    a.) TTE 01/30/2015: EF 30-35%; b.) R/LHC 03/25/2015: EF 25-30%, mRA 13, mPA 30, mPCWP 26, LVEDP 17, CO 3.66, CI 1.8; c.) TTE 06/13/2015: EF 40-45%; d.) TTE 10/06/2017: EF 60-65%; e.) TTE 09/29/2021: EF 45-50%; f.) TTE 02/24/2022: EF 60-65%   Obesity    On apixaban  therapy    On supplemental oxygen  by nasal cannula    a.) 2L/[place] at night   Osteoporosis    Peripheral vascular disease    Right bundle branch block (RBBB) with left anterior fascicular block    Type 2 diabetes mellitus treated with insulin  [facility])      Past Surgical History:  Procedure Laterality Date   ABDOMINAL HYSTERECTOMY     ANKLE FRACTURE SURGERY Left 02/09/2000   AV FISTULA PLACEMENT Left 06/18/2022   Procedure:  ARTERIOVENOUS (AV) FISTULA CREATION (BRACHIAL AXILLA);  Surgeon: Jama Cordella MATSU, MD;  Location: [facility] ORS;  Service: Vascular;  Laterality: Left;   AXILLARY SENTINEL NODE BIOPSY Left 01/18/2024   Procedure: BIOPSY, LYMPH NODE, SENTINEL, AXILLARY;  Surgeon: Rodolph Romano, MD;  Location: [facility] ORS;  Service: General;  Laterality: Left;   BILATERAL SALPINGOOPHORECTOMY  02/08/1998   BREAST BIOPSY Left    US  BX LEFT BREAST COIL CLIP-PATH PENDING   BREAST BIOPSY Left 12/20/2023   US  LT BREAST BX W LOC DEV 1ST LESION IMG BX SPEC US  GUIDE 12/20/2023 [facility]   BREAST BIOPSY Left 12/20/2023   US  LT BREAST BX W LOC DEV EA  ADD LESION IMG BX SPEC US  GUIDE 12/20/2023 [facility]   CARDIAC CATHETERIZATION N/A 03/25/2015   Procedure: Right/Left Heart Cath and Coronary Angiography;  Surgeon: Victory LELON Sharps, MD;  Location: [facility] INVASIVE CV LAB;  Service: Cardiovascular;  Laterality: N/A;   CESAREAN SECTION  02/09/1976   COLONOSCOPY WITH PROPOFOL  N/A 09/22/2016   Procedure: COLONOSCOPY WITH PROPOFOL ;  Surgeon: Viktoria Lamar DASEN, MD;  Location: [facility] ENDOSCOPY;  Service: Endoscopy;  Laterality: N/A;   COLONOSCOPY WITH PROPOFOL  N/A 02/15/2022   Procedure: COLONOSCOPY WITH PROPOFOL ;  Surgeon: Unk Corinn Skiff, MD;  Location: [facility] ENDOSCOPY;  Service: Gastroenterology;  Laterality: N/A;   DIALYSIS/PERMA CATHETER INSERTION N/A 01/19/2023   Procedure: DIALYSIS/PERMA CATHETER INSERTION;  Surgeon: Marea Selinda RAMAN, MD;  Location: [facility] INVASIVE CV LAB;  Service: Cardiovascular;  Laterality: N/A;   EP IMPLANTABLE DEVICE N/A 08/05/2015   Procedure: Loop Recorder Insertion;  Surgeon: Elspeth JAYSON Sage, MD;  Location: [facility] INVASIVE CV LAB;  Service: Cardiovascular;  Laterality: N/A;   ESOPHAGOGASTRODUODENOSCOPY N/A 02/15/2022   Procedure: ESOPHAGOGASTRODUODENOSCOPY (EGD);  Surgeon: Unk Corinn Skiff, MD;  Location: [facility] ENDOSCOPY;  Service: Gastroenterology;  Laterality: N/A;   ESOPHAGOGASTRODUODENOSCOPY (EGD) WITH PROPOFOL  N/A 09/22/2016   Procedure: ESOPHAGOGASTRODUODENOSCOPY (EGD) WITH PROPOFOL ;  Surgeon: Viktoria Lamar DASEN, MD;  Location: [facility] ENDOSCOPY;  Service: Endoscopy;  Laterality: N/A;   ESOPHAGOGASTRODUODENOSCOPY (EGD) WITH PROPOFOL  N/A 12/08/2016   Procedure: ESOPHAGOGASTRODUODENOSCOPY (EGD) WITH PROPOFOL ;  Surgeon: Viktoria Lamar DASEN, MD;  Location: [facility] ENDOSCOPY;  Service: Endoscopy;  Laterality: N/A;   ESOPHAGOGASTRODUODENOSCOPY (EGD) WITH PROPOFOL  N/A 10/12/2022   Procedure: ESOPHAGOGASTRODUODENOSCOPY (EGD) WITH PROPOFOL ;  Surgeon: Unk Corinn Skiff, MD;  Location: [facility] ENDOSCOPY;  Service: Gastroenterology;   Laterality: N/A;   FACIAL LACERATION REPAIR N/A 12/27/2022   Procedure: FACIAL LACERATION REPAIR;  Surgeon: Luciano Elspeth, MD;  Location: [facility] OR;  Service: ENT;  Laterality: N/A;   FRACTURE SURGERY     GIVENS CAPSULE STUDY N/A 04/19/2022   Procedure: GIVENS CAPSULE STUDY;  Surgeon: Unk Corinn Skiff, MD;  Location: [facility] ENDOSCOPY;  Service: Gastroenterology;  Laterality: N/A;   GIVENS CAPSULE STUDY N/A 10/12/2022   Procedure: GIVENS CAPSULE STUDY;  Surgeon: Unk Corinn Skiff, MD;  Location: [facility] ENDOSCOPY;  Service: Gastroenterology;  Laterality: N/A;   INSERTION HYBRID ANTERIOVENOUS GORTEX GRAFT  06/18/2022   Procedure: INSERTION HYBRID ANTERIOVENOUS GORTEX GRAFT;  Surgeon: Jama Cordella MATSU, MD;  Location: [facility] ORS;  Service: Vascular;;   KNEE ARTHROSCOPY Left 02/09/2003   LOWER EXTREMITY ANGIOGRAPHY Left 08/12/2021   Procedure: Lower Extremity Angiography;  Surgeon: Jama Cordella MATSU, MD;  Location: [facility] INVASIVE CV LAB;  Service: Cardiovascular;  Laterality: Left;   PERIPHERAL VASCULAR THROMBECTOMY Left 01/19/2023   Procedure: PERIPHERAL VASCULAR THROMBECTOMY;  Surgeon: Marea Selinda RAMAN, MD;  Location: [facility] INVASIVE CV LAB;  Service: Cardiovascular;  Laterality: Left;   TEE WITHOUT CARDIOVERSION N/A 01/31/2015  Procedure: TRANSESOPHAGEAL ECHOCARDIOGRAM (TEE);  Surgeon: Redell GORMAN Shallow, MD;  Location: [facility] ENDOSCOPY;  Service: Cardiovascular;  Laterality: N/A;   TEMPORARY DIALYSIS CATHETER N/A 08/14/2021   Procedure: TEMPORARY DIALYSIS CATHETER;  Surgeon: Jama Cordella MATSU, MD;  Location: [facility] INVASIVE CV LAB;  Service: Cardiovascular;  Laterality: N/A;   TOTAL MASTECTOMY Bilateral 01/18/2024   Procedure: MASTECTOMY, SIMPLE;  Surgeon: Rodolph Romano, MD;  Location: [facility] ORS;  Service: General;  Laterality: Bilateral;   TUBAL LIGATION  02/09/1976   ULNAR NERVE TRANSPOSITION  02/08/2006   US  LT BREAST BX W LOC DEV 1ST LESION IMG BX SPEC US  GUIDE Left    US  BX LEFT BREAST RIBBON  CLIP-PATH PENDING    Social History   Socioeconomic History   Marital status: Married    Spouse name: Garnette   Number of children: 2   Years of education: Not on file   Highest education level: Bachelor's degree (e.g., BA, AB, BS)  Occupational History   Occupation: disabled    Comment: retired  Tobacco Use   Smoking status: Some Days    Current packs/day: 0.00    Average packs/day: 0.5 packs/day for 48.3 years (24.2 ttl pk-yrs)    Types: Cigarettes    Start date: 02/08/1974    Last attempt to quit: 05/31/2022    Years since quitting: 1.6   Smokeless tobacco: Never  Vaping Use   Vaping status: Never Used  Substance and Sexual Activity   Alcohol use: No    Alcohol/week: 0.0 standard drinks of alcohol   Drug use: No   Sexual activity: Yes    Birth control/protection: None  Other Topics Concern   Not on file  Social History Narrative   Lives at home with stepson and husband, dogs and cats   Caffeine  Drinks sweet tea.   Right handed.    Social Drivers of Health   Tobacco Use: High Risk (02/01/2024)   Patient History    Smoking Tobacco Use: Some Days    Smokeless Tobacco Use: Never    Passive Exposure: Not on file  Financial Resource Strain: Low Risk  (01/03/2024)   Received from [facility]   Overall Financial Resource Strain (CARDIA)    Difficulty of Paying Living Expenses: Not hard at all  Food Insecurity: No Food Insecurity (01/18/2024)   Epic    Worried About Running Out of Food in the Last Year: Never true    Ran Out of Food in the Last Year: Never true  Transportation Needs: No Transportation Needs (01/18/2024)   Epic    Lack of Transportation (Medical): No    Lack of Transportation (Non-Medical): No  Physical Activity: Inactive (09/08/2023)   Exercise Vital Sign    Days of Exercise per Week: 0 days    Minutes of Exercise per Session: Not on file  Stress: No Stress Concern Present (09/08/2023)   [employer] of Occupational Health -  Occupational Stress Questionnaire    Feeling of Stress: Only a little  Social Connections: Moderately Isolated (01/18/2024)   Social Connection and Isolation Panel    Frequency of Communication with Friends and Family: More than three times a week    Frequency of Social Gatherings with Friends and Family: Twice a week    Attends Religious Services: Never    Database Administrator or Organizations: No    Attends Banker Meetings: Never    Marital Status: Married  Catering Manager Violence: Not At Risk (01/18/2024)   Epic  Fear of Current or Ex-Partner: No    Emotionally Abused: No    Physically Abused: No    Sexually Abused: No  Depression (PHQ2-9): Low Risk (02/01/2024)   Depression (PHQ2-9)    PHQ-2 Score: 0  Alcohol Screen: Low Risk (12/04/2021)   Alcohol Screen    Last Alcohol Screening Score (AUDIT): 0  Housing: Low Risk (01/18/2024)   Epic    Unable to Pay for Housing in the Last Year: No    Number of Times Moved in the Last Year: 0    Homeless in the Last Year: No  Utilities: Not At Risk (01/18/2024)   Epic    Threatened with loss of utilities: No  Health Literacy: Not on file    Family History  Problem Relation Age of Onset   Heart disease Mother        died from CHF   Asthma Mother    Diabetes Mother    Heart disease Father    Aneurysm Father    COPD Brother    Diabetes Brother    Alcohol abuse Paternal Aunt    Cancer Maternal Grandmother        unknownorigin   Anemia Neg Hx    Arrhythmia Neg Hx    Clotting disorder Neg Hx    Fainting Neg Hx    Heart attack Neg Hx    Heart failure Neg Hx    Hyperlipidemia Neg Hx    Hypertension Neg Hx     Current Medications[2]  Physical exam:  Vitals:   02/01/24 0959  BP: 129/63  Pulse: 70  Resp: 20  Temp: (!) 97.1 F (36.2 C)  TempSrc: Tympanic  SpO2: 96%  Weight: 203 lb 9.6 oz (92.4 kg)  Height: 5' 6 (1.676 m)   Physical Exam Constitutional:      Comments: Sitting in a  wheelchair.  Appears in no acute distress  Cardiovascular:     Rate and Rhythm: Normal rate and regular rhythm.     Heart sounds: Normal heart sounds.  Pulmonary:     Effort: Pulmonary effort is normal.     Breath sounds: Normal breath sounds.  Abdominal:     General: Bowel sounds are normal.     Palpations: Abdomen is soft.  Skin:    General: Skin is warm and dry.  Neurological:     Mental Status: She is alert and oriented to person, place, and time.   Chest wall exam: patient is s/p b/l mastectomy without reconstruction. Wound is healing well. B/l chest wall drains in place   I have personally reviewed labs listed below:    Latest Ref Rng & Units 01/21/2024    2:22 AM  CMP  Glucose 70 - 99 mg/dL 800   BUN 8 - 23 mg/dL 42   Creatinine 9.55 - 1.00 mg/dL 5.10   Sodium 864 - 854 mmol/L 133   Potassium 3.5 - 5.1 mmol/L 3.9   Chloride 98 - 111 mmol/L 94   CO2 22 - 32 mmol/L 27   Calcium  8.9 - 10.3 mg/dL 8.7   Total Protein 6.5 - 8.1 g/dL 6.6   Total Bilirubin 0.0 - 1.2 mg/dL 0.3   Alkaline Phos 38 - 126 U/L 62   AST 15 - 41 U/L 19   ALT 0 - 44 U/L <5       Latest Ref Rng & Units 01/21/2024    2:22 AM  CBC  WBC 4.0 - 10.5 K/uL 13.8   Hemoglobin 12.0 -  15.0 g/dL 9.6   Hematocrit 63.9 - 46.0 % 31.6   Platelets 150 - 400 K/uL 415    I have personally reviewed Radiology images listed below: No images are attached to the encounter.  DG BREAST SURGICAL SPECIMEN NO CHARGE Result Date: 01/19/2024 This procedure is a no report and no charge.  It will auto finalize.  NM Sentinel Node Inj-No Rpt (Breast) Result Date: 01/18/2024 Lymphoseek was injected by the Nuclear Medicine Technologist for sentinel lymph node localization.     Assessment and plan- Patient is a 68 y.o. female with history of pathological prognostic stage Ib invasive mammary carcinoma of the left breast pT3 N1a M0 ER/PR positive HER2 negative s/p bilateral mastectomy here to discuss further management  Assessment and  Plan    Stage I left breast cancer, ER/PR positive, HER2 negative, with nodal involvement post-bilateral mastectomy ER/PR positive, HER2 negative left breast cancer with a 7.2 cm primary tumor and one positive lymph node, post-bilateral mastectomy. Chemotherapy indicated due to tumor size and nodal involvement. Anthracyclines avoided due to cardiotoxicity risk. TC regimen preferred with dose reduction. Post-chemotherapy radiation therapy recommended. Anti-estrogen therapy considered post-radiation. Treatment sequencing depends on wound healing and drain removal. - Ordered PET scan to assess for metastatic disease prior to chemotherapy initiation. - Coordinated with surgeon for port placement for chemotherapy after surgical clearance and drain removal. - Planned initiation of reduced dose TC regimen every three weeks for four cycles, pending PET scan and surgical healing.  I am planning to give her reduced dose of docetaxel at 60 mg/m due to her existing neuropathy although dose reduction is not required for dialysis specifically.  Cytoxan will be given at half the dose as per dialysis requirements. - Scheduled chemotherapy on non-dialysis days (Tuesdays). - Administered granulocyte colony-stimulating factor two days after each chemotherapy session. - Monitored blood counts during chemotherapy; transfuse as needed. - Referred to radiation oncology for post-mastectomy radiation therapy after chemotherapy. - Planned initiation of anti-estrogen therapy after completion of radiation therapy. - Enrolled in chemotherapy education class. - Monitored surgical wound healing and ensured drain removal prior to chemotherapy.  End-stage renal disease on hemodialysis End-stage renal disease on hemodialysis via AV fistula. Chemotherapy regimen and scheduling tailored to accommodate dialysis and minimize renal risk. Increased infection risk due to immunosuppression and renal disease. - Scheduled chemotherapy on  non-dialysis days (Tuesdays). - Monitored for infection and complications during chemotherapy.  Heart failure with preserved ejection fraction Heart failure with preserved ejection fraction necessitates avoidance of anthracycline-based chemotherapy due to cardiotoxicity risk. Reduced dose TC regimen selected to minimize cardiac risk. Ongoing monitoring for heart failure exacerbation required during treatment. - Avoided anthracycline-based chemotherapy regimens. - Used reduced dose TC regimen. - Monitored for heart failure exacerbation during treatment.  Peripheral neuropathy Pre-existing mild peripheral neuropathy managed by pain medicine physician. Docetaxel may worsen neuropathy; dose reduction planned. Ongoing monitoring for neuropathy symptoms necessary during chemotherapy. - Used reduced dose of docetaxel to minimize risk of worsening neuropathy. - Monitored for new or worsening neuropathy symptoms during chemotherapy.         Cancer Staging  Breast cancer [facility]) Staging form: Breast, AJCC 8th Edition - Pathologic stage from 02/04/2024: Stage IB (pT3, pN1a, cM0, G2, ER+, PR+, HER2-) - Signed by Melanee Annah BROCKS, MD on 02/04/2024 Stage prefix: Initial diagnosis Multigene prognostic tests performed: None Histologic grading system: 3 grade system     Visit Diagnosis 1. Malignant neoplasm of overlapping sites of left breast in female, estrogen receptor positive (HCC)  2. Goals of care, counseling/discussion      Dr. Annah Skene, MD, MPH [facility] [phone] 02/04/2024 2:03 PM                   [1]  Allergies Allergen Reactions   Ivp Dye [Iodinated Contrast Media] Anaphylaxis    Kidney Failure   Codeine Nausea And Vomiting  [2]  Current Outpatient Medications:    albuterol  (VENTOLIN  HFA) 108 (90 Base) MCG/ACT inhaler, TAKE 2 PUFFS BY MOUTH EVERY 6 HOURS AS NEEDED FOR WHEEZE OR SHORTNESS OF BREATH, Disp: 8.5 each, Rfl: 2    atorvastatin  (LIPITOR) 40 MG tablet, Take 1 tablet (40 mg total) by mouth daily., Disp: 90 tablet, Rfl: 3   buPROPion  (WELLBUTRIN  SR) 150 MG 12 hr tablet, Take 1 tablet (150 mg total) by mouth 2 (two) times daily., Disp: 180 tablet, Rfl: 3   calcium  carbonate (TUMS EX) 750 MG chewable tablet, Chew 1 tablet by mouth daily., Disp: , Rfl:    ELIQUIS  5 MG TABS tablet, TAKE 1 TABLET TWICE A DAY, Disp: 180 tablet, Rfl: 2   fluticasone  (FLONASE ) 50 MCG/ACT nasal spray, Place 2 sprays into both nostrils daily. (Patient taking differently: Place 2 sprays into both nostrils daily as needed for allergies.), Disp: 16 g, Rfl: 6   gabapentin  (NEURONTIN ) 300 MG capsule, Take 300 mg by mouth daily., Disp: , Rfl:    hydroxyurea  (HYDREA ) 500 MG capsule, TAKE 1 CAPSULE BY MOUTH EVERY OTHER DAY. MAY TAKE WITH FOOD TO MINIMIZE GI SIDE EFFECTS. (Patient taking differently: Take 500 mg by mouth See admin instructions. Take 500 mg by mouth 6 days a week), Disp: 45 capsule, Rfl: 1   insulin  aspart (NOVOLOG ) 100 UNIT/ML injection, Insulin  pump, Disp: , Rfl:    Insulin  Disposable Pump (OMNIPOD DASH PODS, GEN 4,) MISC, USE 1 POD EACH EVERY 72    HOURS, Disp: , Rfl:    Insulin  Human (INSULIN  PUMP) SOLN, Per endocrinoloy, Disp: , Rfl:    loratadine  (CLARITIN ) 10 MG tablet, Take 10 mg by mouth daily., Disp: , Rfl:    meclizine  (ANTIVERT ) 12.5 MG tablet, Take 1 tablet (12.5 mg total) by mouth 3 (three) times daily as needed for dizziness., Disp: 30 tablet, Rfl: 0   OXYGEN , Inhale 2 L into the lungs at bedtime., Disp: , Rfl:    PARoxetine  (PAXIL ) 40 MG tablet, Take 1 tablet (40 mg total) by mouth daily., Disp: 90 tablet, Rfl: 3   torsemide  (DEMADEX ) 20 MG tablet, TAKE 3 TABLETS BY MOUTH EVERY DAY (Patient taking differently: Take 40 mg by mouth daily.), Disp: 270 tablet, Rfl: 1   traMADol  (ULTRAM ) 50 MG tablet, Take 1 tablet (50 mg total) by mouth every 6 (six) hours as needed., Disp: 20 tablet, Rfl: 0   Vitamin D , Ergocalciferol ,  (DRISDOL ) 1.25 MG (50000 UNIT) CAPS capsule, Take 50,000 Units by mouth once a week., Disp: , Rfl:    Spacer/Aero-Holding Chambers (OPTICHAMBER DIAMOND ) MISC, Use with inhaler to ensure medication is delivered throughout lungs (Patient not taking: No sig reported), Disp: 1 each, Rfl: 0   traMADol  (ULTRAM ) 50 MG tablet, Take 1 tablet (50 mg total) by mouth 2 (two) times daily as needed., Disp: 60 tablet, Rfl: 2  "

## 2024-02-05 ENCOUNTER — Other Ambulatory Visit: Payer: Self-pay

## 2024-02-06 ENCOUNTER — Other Ambulatory Visit: Payer: Self-pay

## 2024-02-06 DIAGNOSIS — C50812 Malignant neoplasm of overlapping sites of left female breast: Secondary | ICD-10-CM

## 2024-02-07 ENCOUNTER — Ambulatory Visit: Payer: Self-pay | Admitting: General Surgery

## 2024-02-07 ENCOUNTER — Inpatient Hospital Stay

## 2024-02-07 DIAGNOSIS — Z170 Estrogen receptor positive status [ER+]: Secondary | ICD-10-CM

## 2024-02-07 NOTE — Progress Notes (Signed)
 [facility] CSW Progress Note  Clinical Social Work introduced self to patient during Patient Education with Raoul Moats, [facility].  Rosaline Ide, CSW, was also present.  Provided information regarding CSW role, including counseling, advanced care planning and support group.  Answered questions as needed.   Follow Up Plan:  CSW will follow-up with patient by phone     Macario CHRISTELLA Au, LCSW Clinical Social Worker [facility]

## 2024-02-08 ENCOUNTER — Inpatient Hospital Stay: Admitting: Licensed Clinical Social Worker

## 2024-02-08 ENCOUNTER — Encounter: Admission: RE | Admit: 2024-02-08 | Source: Ambulatory Visit

## 2024-02-08 DIAGNOSIS — Z170 Estrogen receptor positive status [ER+]: Secondary | ICD-10-CM | POA: Insufficient documentation

## 2024-02-08 DIAGNOSIS — C50812 Malignant neoplasm of overlapping sites of left female breast: Secondary | ICD-10-CM | POA: Insufficient documentation

## 2024-02-08 NOTE — Progress Notes (Signed)
 [facility] Psychosocial Distress Screening Clinical Social Work  Jocelyn Wells is a 68 y.o. year old female. Clinical Social Work was referred by nurse navigator for positive distress screening. The patient scored a 10 on the Psychosocial Distress Thermometer which indicates severe distress. Clinical Social Worker contacted patient by phone to assess for distress and other psychosocial needs.     Distress Screen:    02/07/2024    2:29 PM  ONCBCN DISTRESS SCREENING  Screening Type Initial Screening  How much distress have you been experiencing in the past week? (0-10) 10  Practical concerns type Taking care of myself;Treatment decisions  Social concerns type Relationship with spouse or partner;Relationship with children  Emotional concerns type Worry or anxiety;Sadness or depression;Loss of interest or enjoyment;Grief or loss;Fear;Feelings of worthlessness or being a burden  Physical Concerns Type  Pain;Sleep;Fatigue;Tobacco use;Memory or concentration;Changes in eating;Loss or change of physical abilities     Interventions: Provided brief mental health counseling with regard to adjusting to her illness.  Patient is married with one son who lives near [place], [place].  Patient currently receives dialysis three times a week.  She is prescribed Paxil  by her PCP and sees a child psychotherapist at the dialysis clinic.  She has all of her needs met, but is worried about the future.   CSW and patient discussed common feeling and emotions when being diagnosed with cancer, and the importance of support during treatment.  CSW informed patient of the support team and support services at [facility].  CSW provided contact information and encouraged patient to call with any questions or concerns.   Follow Up Plan: Patient will contact CSW with any support or resource needs Patient verbalizes understanding of plan: Yes    [name], LCSW

## 2024-02-10 ENCOUNTER — Encounter
Admission: RE | Admit: 2024-02-10 | Discharge: 2024-02-10 | Disposition: A | Discharge status: Home / Self Care | Source: Ambulatory Visit | Attending: Oncology | Admitting: Oncology

## 2024-02-10 DIAGNOSIS — C50912 Malignant neoplasm of unspecified site of left female breast: Secondary | ICD-10-CM | POA: Insufficient documentation

## 2024-02-10 DIAGNOSIS — Z170 Estrogen receptor positive status [ER+]: Secondary | ICD-10-CM | POA: Diagnosis present

## 2024-02-10 DIAGNOSIS — C50812 Malignant neoplasm of overlapping sites of left female breast: Secondary | ICD-10-CM | POA: Insufficient documentation

## 2024-02-10 LAB — GLUCOSE, CAPILLARY: Glucose-Capillary: 178 mg/dL — ABNORMAL HIGH (ref 70–99)

## 2024-02-10 MED ORDER — FLUDEOXYGLUCOSE F - 18 (FDG) INJECTION
11.4900 | Freq: Once | INTRAVENOUS | Status: AC | PRN
  Administered 2024-02-10: 11.49 via INTRAVENOUS

## 2024-02-13 NOTE — Progress Notes (Deleted)
 PROVIDER NOTE: Interpretation of information contained herein should be left to medically-trained personnel. Specific patient instructions are provided elsewhere under Patient Instructions section of medical record. This document was created in part using AI and STT-dictation technology, any transcriptional errors that may result from this process are unintentional.  Patient: Jocelyn Wells  Service: E/M   PCP: Donzella Lauraine SAILOR, DO  DOB: 15-Sep-1955  DOS: 02/15/2024  Provider: Emmy MARLA Blanch, NP  MRN: [id number]  Delivery: Face-to-face  Specialty: Interventional Pain Management  Type: Established Patient  Setting: Ambulatory outpatient facility  Specialty designation: 09  Referring Prov.: Donzella Lauraine SAILOR, DO  Location: Outpatient office facility       History of present illness (HPI) Jocelyn Wells, a 69 y.o. year old female, is here today because of her No primary diagnosis found.. Jocelyn Wells primary complain today is No chief complaint on file.  Pertinent problems: Jocelyn Wells has H/O: osteoarthritis; Anxiety, generalized; Disturbance of skin sensation; Essential hypertension; Arthritis, degenerative; Pain of perianal area; Long term current use of insulin  (HCC); History of stroke; Morbid obesity due to excess calories (HCC); COPD (chronic obstructive pulmonary disease) (HCC); Chronic deep vein thrombosis (DVT) (HCC); Compression fracture of L1 lumbar vertebra (HCC); Compression fracture of body of thoracic vertebra (HCC); Lumbar foraminal stenosis (RIGHT L1); Spinal stenosis of lumbar region with neurogenic claudication (L4-L5); Uncontrolled type 2 diabetes mellitus with hyperglycemia, with long-term current use of insulin  (HCC); Osteoporosis, post-menopausal; NYHA class 3 heart failure with preserved ejection fraction, with improvement of ejection fraction from prior measurement [facility]); Chronic painful diabetic neuropathy (HCC); and Chronic pain syndrome on their pertinent problem list.  Pain  Assessment: Severity of   is reported as a  /10. Location:    / . Onset:  . Quality:  . Timing:  . Modifying factor(s):  SABRA Vitals:  vitals were not taken for this visit.  BMI: Estimated body mass index is 32.86 kg/m as calculated from the following:   Height as of 02/01/24: 5' 6 (1.676 m).   Weight as of 02/01/24: 203 lb 9.6 oz (92.4 kg).  Last encounter: 11/21/2023. Last procedure: Visit date not found.  Reason for encounter: {Blank single:19197::medication management,post-procedure evaluation and assessment,both, medication management and post-procedure evaluation and assessment,evaluation of worsening, or previously known (established) problem,patient-requested evaluation,follow-up evaluation,evaluation for possible interventional PM therapy/treatment,evaluation of new problem, *** }.   Discussed the use of AI scribe software for clinical note transcription with the patient, who gave verbal consent to proceed.  History of Present Illness           Pharmacotherapy Assessment   Tramadol  (Ultram ) 50 mg tab 2 times per day as needed for her pain. MME=20 Monitoring: [place] PMP: PDMP reviewed during this encounter.       Pharmacotherapy: No side-effects or adverse reactions reported. Compliance: No problems identified. Effectiveness: Clinically acceptable.  [name], RN  11/21/2023 11:44 AM  Sign when Signing Visit Nursing Pain Medication Assessment:  Safety precautions to be maintained throughout the outpatient stay will include: orient to surroundings, keep bed in low position, maintain call bell within reach at all times, provide assistance with transfer out of bed and ambulation.  Medication Inspection Compliance: Pill count conducted under aseptic conditions, in front of the patient. Neither the pills nor the bottle was removed from the patient's sight at any time. Once count was completed pills were immediately returned to the patient in their original  bottle.  Medication: Tramadol  (Ultram ) Pill/Patch Count: 24 of 60 pills/patches  remain Pill/Patch Appearance: Markings consistent with prescribed medication Bottle Appearance: Standard pharmacy container. Clearly labeled. Filled Date: 09 / 06 / 2025 Last Medication intake:  YesterdaySafety precautions to be maintained throughout the outpatient stay will include: orient to surroundings, keep bed in low position, maintain call bell within reach at all times, provide assistance with transfer out of bed and ambulation.     UDS:  Summary  Date Value Ref Range Status  10/25/2022 Note  Final    Comment:    ==================================================================== [employer] Select 13 (MW) ==================================================================== Test                             Result       Flag       Units  Drug Present and Declared for Prescription Verification   Tramadol                        >13889       EXPECTED   ng/mg creat   O-Desmethyltramadol            3197         EXPECTED   ng/mg creat   N-Desmethyltramadol            7950         EXPECTED   ng/mg creat    Source of tramadol  is a prescription medication. O-desmethyltramadol    and N-desmethyltramadol are expected metabolites of tramadol .  Drug Present not Declared for Prescription Verification   Alcohol, Ethyl                 0.041        UNEXPECTED g/dL    Sources of ethyl alcohol include alcoholic beverages or as a    fermentation product of glucose; glucose is present in this specimen.    Interpret result with caution, as the presence of ethyl alcohol is    likely due, at least in part, to fermentation of glucose.  ==================================================================== Test                      Result    Flag   Units      Ref Range   Creatinine              36               mg/dL      >=79 ==================================================================== Declared Medications:  The  flagging and interpretation on this report are based on the  following declared medications.  Unexpected results may arise from  inaccuracies in the declared medications.   **Note: The testing scope of this panel includes these medications:   Tramadol  (Ultram )   **Note: The testing scope of this panel does not include the  following reported medications:   Albuterol   Apixaban  (Eliquis )  Atorvastatin  (Lipitor)  Bupropion  (Wellbutrin  SR)  Calcitriol   Dulaglutide  (Trulicity )  Fluticasone  (Flonase )  Gabapentin  (Neurontin )  Helium  Hydroxyurea  (Hydrea )  Insulin  (NovoLog )  Loratadine  (Claritin )  Meclizine  (Antivert )  Oxygen   Paroxetine  (Paxil )  Torsemide  (Demadex )  Vitamin D2 (Drisdol ) ==================================================================== For clinical consultation, please call [phone]. ====================================================================     No results found for: CBDTHCR No results found for: D8THCCBX No results found for: D9THCCBX  ROS  Constitutional: Denies any fever or chills Gastrointestinal: No reported hemesis, hematochezia, vomiting, or acute GI distress Musculoskeletal: Bilateral knee pain, bilateral foot pain Neurological: No reported episodes  of acute onset apraxia, aphasia, dysarthria, agnosia, amnesia, paralysis, loss of coordination, or loss of consciousness  Medication Review  Omnipod DASH Pods (Gen 4), Oxygen -Helium, PARoxetine , albuterol , apixaban , atorvastatin , buPROPion , calcium  carbonate, fluticasone , gabapentin , hydroxyurea , insulin  aspart, insulin  pump, loratadine , meclizine , optichamber diamond , torsemide , and traMADol   History Review  Allergy: Jocelyn Wells is allergic to ivp dye [iodinated contrast media] and codeine. Drug: Jocelyn Wells  reports no history of drug use. Alcohol:  reports no history of alcohol use. Tobacco:  reports that she has been smoking cigarettes. She started smoking about 49 years ago.  She has a 24.2 pack-year smoking history. She has never used smokeless tobacco. Social: Ms. Faeth  reports that she has been smoking cigarettes. She started smoking about 49 years ago. She has a 24.2 pack-year smoking history. She has never used smokeless tobacco. She reports that she does not drink alcohol and does not use drugs. Medical:  has a past medical history of Acute ischemic left MCA stroke (HCC) (01/29/2015), Acute renal failure superimposed on stage 4 chronic kidney disease (HCC) (01/29/2015), Allergy, Anemia of chronic renal failure, Anxiety, Arthritis, CHF (congestive heart failure) (HCC), Complication of anesthesia, COPD (chronic obstructive pulmonary disease) (HCC), Depression, Dyspnea, ESRD (end stage renal disease) (HCC), Headache, HFrEF (heart failure with reduced ejection fraction) (HCC), HTN (hypertension), Leukocytosis, Long term current use of anticoagulant, Long term current use of antithrombotics/antiplatelets, NICM (nonischemic cardiomyopathy) (HCC), Obesity, On supplemental oxygen  by nasal cannula, Osteoporosis, Peripheral vascular disease, and Type 2 diabetes mellitus treated with insulin  (HCC). Surgical: Ms. Bale  has a past surgical history that includes Abdominal hysterectomy; Tubal ligation (02/09/1976); Cesarean section (02/09/1976); Knee arthroscopy (Left, 02/09/2003); Ulnar nerve transposition (02/08/2006); Bilateral salpingoophorectomy (02/08/1998); Ankle fracture surgery (Left, 02/09/2000); TEE without cardioversion (N/A, 01/31/2015); Cardiac catheterization (N/A, 03/25/2015); Cardiac catheterization (N/A, 08/05/2015); Esophagogastroduodenoscopy (egd) with propofol  (N/A, 09/22/2016); Colonoscopy with propofol  (N/A, 09/22/2016); Esophagogastroduodenoscopy (egd) with propofol  (N/A, 12/08/2016); Lower Extremity Angiography (Left, 08/12/2021); TEMPORARY DIALYSIS CATHETER (N/A, 08/14/2021); Fracture surgery; Colonoscopy with propofol  (N/A, 02/15/2022); Esophagogastroduodenoscopy  (N/A, 02/15/2022); Givens capsule study (N/A, 04/19/2022); AV fistula placement (Left, 06/18/2022); Insertion hybrid anteriovenous gortex graft (06/18/2022); Esophagogastroduodenoscopy (egd) with propofol  (N/A, 10/12/2022); Givens capsule study (N/A, 10/12/2022); Facial laceration repair (N/A, 12/27/2022); PERIPHERAL VASCULAR THROMBECTOMY (Left, 01/19/2023); and DIALYSIS/PERMA CATHETER INSERTION (N/A, 01/19/2023). Family: family history includes Alcohol abuse in her paternal aunt; Aneurysm in her father; Asthma in her mother; COPD in her brother; Cancer in her maternal grandmother; Diabetes in her brother and mother; Heart disease in her father and mother.  Laboratory Chemistry Profile   Renal Lab Results  Component Value Date   BUN 48 (H) 09/02/2023   CREATININE 4.32 (H) 09/02/2023   LABCREA 3.7 04/22/2022   BCR 15 12/04/2021   GFRAA 33 (L) 04/23/2019   GFRNONAA 11 (L) 09/02/2023    Hepatic Lab Results  Component Value Date   AST 29 06/29/2023   ALT 22 06/29/2023   ALBUMIN  3.5 06/29/2023   ALKPHOS 75 06/29/2023    Electrolytes Lab Results  Component Value Date   NA 142 09/02/2023   K 3.6 09/02/2023   CL 104 09/02/2023   CALCIUM  6.9 (L) 09/02/2023   MG 2.3 09/02/2023   PHOS 3.2 12/31/2022    Bone No results found for: VD25OH, VD125OH2TOT, CI6874NY7, CI7874NY7, 25OHVITD1, 25OHVITD2, 25OHVITD3, TESTOFREE, TESTOSTERONE  Inflammation (CRP: Acute Phase) (ESR: Chronic Phase) Lab Results  Component Value Date   ESRSEDRATE 23 10/20/2020   LATICACIDVEN 1.4 05/31/2020         Note: Above Lab  results reviewed.  Recent Imaging Review  DG PAIN CLINIC C-ARM 1-60 MIN NO REPORT Fluoro was used, but no Radiologist interpretation will be provided.  Please refer to NOTES tab for provider progress note. Note: Reviewed        Physical Exam  Vitals: BP (!) 126/52 (Cuff Size: Normal)   Pulse 65   Temp (!) 97.1 F (36.2 C) (Temporal)   Resp 16   Ht 5' 6 (1.676 m)   Wt 200  lb (90.7 kg)   SpO2 96%   BMI 32.28 kg/m  BMI: Estimated body mass index is 32.28 kg/m as calculated from the following:   Height as of this encounter: 5' 6 (1.676 m).   Weight as of this encounter: 200 lb (90.7 kg). Ideal: Ideal body weight: 59.3 kg (130 lb 11.7 oz) Adjusted ideal body weight: 71.9 kg (158 lb 7 oz) General appearance: Well nourished, well developed, and well hydrated. In no apparent acute distress Mental status: Alert, oriented x 3 (person, place, & time)       Respiratory: No evidence of acute respiratory distress Eyes: PERLA  Musculoskeletal: Bilateral knee pain, bilateral foot pain Assessment   Diagnosis Status  1. Chronic pain syndrome   2. Chronic painful diabetic neuropathy (HCC)   3. Primary osteoarthritis of right knee   4. Primary osteoarthritis of left knee   5. Chronic pain of left knee   6. Type 2 diabetes mellitus with moderate nonproliferative retinopathy and macular edema, with long-term current use of insulin , unspecified laterality (HCC)    Controlled Controlled Controlled   Updated Problems: No problems updated.  Plan of Care  Problem-specific:  Assessment and Plan  Chronic left knee pain Chronic left knee pain rated 5/10, worsened by activity, relieved by rest. - Continue tramadol  50 mg, one tablet by mouth twice daily.  Chronic pain syndrome Chronic pain syndrome managed with tramadol . No side effects or adverse reaction reported to medication.  Prescribing drug monitoring (PDMP) reviewed, findings consistent with the use of prescribed medication and no evidence of narcotic misuse or abuse.  Urine drug screening (UDS) up to date and consistent with the use of prescribed medication.  - Send tramadol  prescription to [facility] for three-month supply. - Reinforced tramadol  dosing: 50 mg twice daily.   Schedule (F2F) (MM) in 3 months with Emmy Blanch NP  Chronic painful diabetic neuropathy: She previously received Qutenza   treatment, which provided significant pain relief for approximately 5 months.  Due to recent flareups in both feet, she is requesting to repeat the Qutenza  treatment.  - Qutenza  overdue. - Schedule Qutenza  treatment with Dr. Marcelino for next week.  Plan: (Clinic): (B) Quentza Treatment # 4 with Dr. Marcelino    Monitoring: [place] PMP: PDMP not reviewed this encounter. {Blank single:19197::Unable to conduct review of the controlled substance reporting system due to technological failure.,     } Pharmacotherapy: {Blank single:19197::Opioid-induced constipation (OIC)(K59.03, T40.2X5A),No side-effects or adverse reactions reported.} Compliance: {Blank single:19197::Medication agreement violation - unsanctioned acquisition/use of additional opioid-containing medication,No problems identified.} Effectiveness: {Blank single:19197::Clinically acceptable.}  No notes on file  UDS:  Summary  Date Value Ref Range Status  10/25/2022 Note  Final    Comment:    ==================================================================== [employer] Select 13 (MW) ==================================================================== Test                             Result       Flag  Units  Drug Present and Declared for Prescription Verification   Tramadol                        >86110       EXPECTED   ng/mg creat   O-Desmethyltramadol            3197         EXPECTED   ng/mg creat   N-Desmethyltramadol            7950         EXPECTED   ng/mg creat    Source of tramadol  is a prescription medication. O-desmethyltramadol    and N-desmethyltramadol are expected metabolites of tramadol .  Drug Present not Declared for Prescription Verification   Alcohol, Ethyl                 0.041        UNEXPECTED g/dL    Sources of ethyl alcohol include alcoholic beverages or as a    fermentation product of glucose; glucose is present in this specimen.    Interpret result with caution, as the presence of ethyl  alcohol is    likely due, at least in part, to fermentation of glucose.  ==================================================================== Test                      Result    Flag   Units      Ref Range   Creatinine              36               mg/dL      >=79 ==================================================================== Declared Medications:  The flagging and interpretation on this report are based on the  following declared medications.  Unexpected results may arise from  inaccuracies in the declared medications.   **Note: The testing scope of this panel includes these medications:   Tramadol  (Ultram )   **Note: The testing scope of this panel does not include the  following reported medications:   Albuterol   Apixaban  (Eliquis )  Atorvastatin  (Lipitor)  Bupropion  (Wellbutrin  SR)  Calcitriol   Dulaglutide  (Trulicity )  Fluticasone  (Flonase )  Gabapentin  (Neurontin )  Helium  Hydroxyurea  (Hydrea )  Insulin  (NovoLog )  Loratadine  (Claritin )  Meclizine  (Antivert )  Oxygen   Paroxetine  (Paxil )  Torsemide  (Demadex )  Vitamin D2 (Drisdol ) ==================================================================== For clinical consultation, please call [phone]. ====================================================================     No results found for: CBDTHCR No results found for: D8THCCBX No results found for: D9THCCBX  ROS  Constitutional: {Blank single:19197::Denies any fever or chills} Gastrointestinal: {Blank single:19197::No reported hemesis, hematochezia, vomiting, or acute GI distress} Musculoskeletal: {Blank single:19197::Denies any acute onset joint swelling, redness, loss of ROM, or weakness} Neurological: {Blank single:19197::No reported episodes of acute onset apraxia, aphasia, dysarthria, agnosia, amnesia, paralysis, loss of coordination, or loss of consciousness}  Medication Review  Omnipod DASH Pods (Gen 4), Oxygen -Helium, PARoxetine ,  Vitamin D  (Ergocalciferol ), albuterol , apixaban , atorvastatin , buPROPion , calcium  carbonate, dexamethasone , fluticasone , gabapentin , hydroxyurea , insulin  aspart, insulin  pump, lidocaine -prilocaine , loratadine , meclizine , ondansetron , optichamber diamond , prochlorperazine , torsemide , and traMADol   History Review  Allergy: Jocelyn Wells is allergic to ivp dye [iodinated contrast media] and codeine. Drug: Jocelyn Wells  reports no history of drug use. Alcohol:  reports no history of alcohol use. Tobacco:  reports that she has been smoking cigarettes. She started smoking about 50 years ago. She has a 24.2 pack-year smoking history. She has never used smokeless tobacco. Social: Ms. Mcclenahan  reports that she  has been smoking cigarettes. She started smoking about 50 years ago. She has a 24.2 pack-year smoking history. She has never used smokeless tobacco. She reports that she does not drink alcohol and does not use drugs. Medical:  has a past medical history of Acute ischemic left MCA stroke (HCC) (01/29/2015), Allergy, Anemia of chronic renal failure, Anxiety, Aortic atherosclerosis, Arthritis, Complication of anesthesia, COPD (chronic obstructive pulmonary disease) (HCC), Depression, Dyspnea, ESRD (end stage renal disease) on dialysis (M-W-F), Headache, HFrEF (heart failure with reduced ejection fraction) (HCC), HTN (hypertension), Leukocytosis, Malignant neoplasm of left breast (HCC), NICM (nonischemic cardiomyopathy) (HCC), Obesity, On apixaban  therapy, On supplemental oxygen  by nasal cannula, Osteoporosis, Peripheral vascular disease, 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 Surgical: Jocelyn Wells  has a past surgical history that includes Abdominal hysterectomy; Tubal ligation (02/09/1976); Cesarean section (02/09/1976); Knee arthroscopy (Left, 02/09/2003); Ulnar nerve transposition (02/08/2006); Bilateral salpingoophorectomy  (02/08/1998); Ankle fracture surgery (Left, 02/09/2000); TEE without cardioversion (N/A, 01/31/2015); Cardiac catheterization (N/A, 03/25/2015); Cardiac catheterization (N/A, 08/05/2015); Esophagogastroduodenoscopy (egd) with propofol  (N/A, 09/22/2016); Colonoscopy with propofol  (N/A, 09/22/2016); Esophagogastroduodenoscopy (egd) with propofol  (N/A, 12/08/2016); Lower Extremity Angiography (Left, 08/12/2021); TEMPORARY DIALYSIS CATHETER (N/A, 08/14/2021); Fracture surgery; Colonoscopy with propofol  (N/A, 02/15/2022); Esophagogastroduodenoscopy (N/A, 02/15/2022); Givens capsule study (N/A, 04/19/2022); AV fistula placement (Left, 06/18/2022); Insertion hybrid anteriovenous gortex graft (06/18/2022); Esophagogastroduodenoscopy (egd) with propofol  (N/A, 10/12/2022); Givens capsule study (N/A, 10/12/2022); Facial laceration repair (N/A, 12/27/2022); PERIPHERAL VASCULAR THROMBECTOMY (Left, 01/19/2023); DIALYSIS/PERMA CATHETER INSERTION (N/A, 01/19/2023); Breast biopsy (Left); US  LT BREAST BX W LOC DEV 1ST LESION IMG BX SPEC US  GUIDE (Left); Breast biopsy (Left, 12/20/2023); Breast biopsy (Left, 12/20/2023); Total mastectomy (Bilateral, 01/18/2024); and Axillary sentinel node biopsy (Left, 01/18/2024). Family: family history includes Alcohol abuse in her paternal aunt; Aneurysm in her father; Asthma in her mother; COPD in her brother; Cancer in her maternal grandmother; Diabetes in her brother and mother; Heart disease in her father and mother.  Laboratory Chemistry Profile   Renal Lab Results  Component Value Date   BUN 42 (H) 01/21/2024   CREATININE 4.89 (H) 01/21/2024   LABCREA 3.7 04/22/2022   BCR 15 12/04/2021   GFRAA 33 (L) 04/23/2019   GFRNONAA 9 (L) 01/21/2024    Hepatic Lab Results  Component Value Date   AST 19 01/21/2024   ALT <5 01/21/2024   ALBUMIN  3.9 01/21/2024   ALKPHOS 62 01/21/2024    Electrolytes Lab Results  Component Value Date   NA 133 (L) 01/21/2024   K 3.9 01/21/2024   CL  94 (L) 01/21/2024   CALCIUM  8.7 (L) 01/21/2024   MG 2.3 09/02/2023   PHOS 4.6 01/19/2024    Bone No results found for: VD25OH, VD125OH2TOT, CI6874NY7, CI7874NY7, 25OHVITD1, 25OHVITD2, 25OHVITD3, TESTOFREE, TESTOSTERONE  Inflammation (CRP: Acute Phase) (ESR: Chronic Phase) Lab Results  Component Value Date   ESRSEDRATE 23 10/20/2020   LATICACIDVEN 1.4 05/31/2020         Note: {Blank single:19197::Ms. Bors indicates labs done and monitored by primary care practitioner using a non-[facility] EMR system,No results found under the [facility] electronic medical record,Patient noncompliant with Lab Work orders.,Results made available to patient.,Lab results reviewed and made available to patient.,Lab results reviewed and explained to patient in Layman's terms.,Above Lab results reviewed.}  Recent Imaging Review  NM PET Image Initial (PI) Skull Base To Thigh EXAM: PET AND CT SKULL BASE TO MID THIGH 02/10/2024 02:16:17 PM  TECHNIQUE:  RADIOPHARMACEUTICAL: 11.49 mCi F-18 FDG  Uptake time 60 minutes. Glucose level 178 mg/dl.  PET imaging was acquired from the base of the skull to the mid thighs. Non-contrast enhanced computed tomography was obtained for attenuation correction and anatomic localization.  COMPARISON: Lung cancer screening CT from 05/25/20xx.  CLINICAL HISTORY: Cancer.  FINDINGS:  HEAD AND NECK: Asymmetric opacification of the left maxillary sinus with chronic mucoperiosteal thickening. No metabolically active cervical lymphadenopathy.  CHEST: Cardiac enlargement. Aortic atherosclerosis. Coronary artery calcifications. Postoperative changes from recent bilateral total mastectomy. There is diffuse increased tracer uptake within the superficial soft tissues of the bilateral ventral chest wall consistent with postoperative change. Bilateral chest wall fluid collections compatible with postoperative seromas. There are still a few small  foci of gas identified within the operative site. No tracer avid chest wall mass identified. No hypermetabolic lung nodules. No metabolically active lymphadenopathy (mediastinal, hilar, or axillary).  ABDOMEN AND PELVIS: No tracer avid lesions identified within the liver, pancreas, spleen, or adrenal glands. No tracer avid abdominal or pelvic lymph nodes. Atherosclerotic calcification involving the abdominal aorta. Gallstones. Fat-containing umbilical hernia, small. No ascites. No metabolically active intraperitoneal mass. Physiologic activity within the gastrointestinal and genitourinary systems.  BONES AND SOFT TISSUE: Compression fracture is identified at T12 without increased uptake, likely chronic. Appears similar when compared with lung cancer screening CT from 07/03/2023. Vascular stent noted in the left upper extremity. No abnormal FDG activity localizes to the bones. No metabolically active aggressive osseous lesion.  Other findings include cardiac enlargement, aortic atherosclerosis, coronary artery calcifications, a vascular stent in the left upper extremity, asymmetric opacification of the left maxillary sinus with chronic mucoperiosteal thickening, gallstones, a small fat-containing umbilical hernia, and a chronic compression fracture at T12 without increased uptake.  IMPRESSION: 1. No evidence of FDG-avid malignancy. 2. Postoperative changes from bilateral mastectomy with bilateral chest wall seromas and expected postoperative FDG uptake; superimposed infection is not excluded, and targeted ultrasound or contrast-enhanced CT is suggested if there is clinical concern.  Electronically signed by: Waddell Calk MD 02/10/2024 03:05 PM EST RP Workstation: [id number] Note: {Blank single:19197::No new results found.,No results found under the [facility] electronic medical record.,Imaging results reviewed and explained to patient in Layman's terms.,Results of  ordered imaging test(s) reviewed and explained to patient in Layman's terms.,Imaging results reviewed.,Reviewed} {Blank single:19197::Results visible under [facility].,Results visible under [facility].,Results visible under [facility].,Results visible under [facility].,Results visible under Care Everywhere.,Results made available to patient.,Copy of results provided to patient.,Patient noncompliant with diagnostic imaging orders.,     }  Physical Exam  Vitals: There were no vitals taken for this visit. BMI: Estimated body mass index is 32.86 kg/m as calculated from the following:   Height as of 02/01/24: 5' 6 (1.676 m).   Weight as of 02/01/24: 203 lb 9.6 oz (92.4 kg). Ideal: Ideal body weight: 59.3 kg (130 lb 11.7 oz) Adjusted ideal body weight: 72.5 kg (159 lb 14.1 oz) General appearance: {general exam:210120802::Well nourished, well developed, and well hydrated. In no apparent acute distress} Mental status: {Blank single:19197::Alert and oriented x 3. Exaggerated physical and/or psychosocial pain behavior perceived.,Alert, oriented x 3 (person, place, & time)} {Blank single:19197::Ms. Lowman's speech pattern and demeanor seems to suggest oversedation,     } Respiratory: {Blank single:19197::Oxygen -dependent COPD,No evidence of acute respiratory distress} Eyes: {Blank single:19197::Miotic (pupilary constriction) due to opiate use,Midriatic,Anisocoric,Evidence of ptosis,Pin-point pupils,PERLA}   Assessment   Diagnosis Status  No diagnosis found. {Blank single:19197::Absent,Deteriorating,Having a Flare-up,Improved,Improving,Not improving,Not responding,Persistent,Present,Recurring,Reoccurring,Resolved,Responding,Stable,Unchanged,improved,Worsened,Worsening,Controlled} {Blank single:19197::Absent,Deteriorating,Having a Flare-up,Improved,Improving,Not improving,Not  responding,Persistent,Present,Recurring,Reoccurring,Resolved,Responding,Stable,Unchanged,improved,Worsened,Worsening,Controlled} {Blank single:19197::Absent,Deteriorating,Having a  Flare-up,Improved,Improving,Not improving,Not responding,Persistent,Present,Recurring,Reoccurring,Resolved,Responding,Stable,Unchanged,improved,Worsened,Worsening,Controlled}   Updated Problems: No problems updated.  Plan of Care  Problem-specific:  Assessment and Plan            Ms. Tannia A Dales has a current medication list which includes the following long-term medication(s): albuterol , atorvastatin , bupropion , eliquis , fluticasone , insulin  pump, loratadine , paroxetine , prochlorperazine , optichamber diamond , and torsemide .  Pharmacotherapy (Medications Ordered): No orders of the defined types were placed in this encounter.  Orders:  No orders of the defined types were placed in this encounter.    {There is no content from the last Plan section.}   No follow-ups on file.    Recent Visits Date Type Provider Dept  12/12/23 Procedure visit Marcelino Nurse, MD [facility]  11/21/23 Office Visit Kiley Torrence K, NP [facility]  Showing recent visits within past 90 days and meeting all other requirements Future Appointments Date Type Provider Dept  02/15/24 Appointment Sukari Grist K, NP [facility]  03/12/24 Appointment Marcelino Nurse, MD [facility]  Showing future appointments within next 90 days and meeting all other requirements  I discussed the assessment and treatment plan with the patient. The patient was provided an opportunity to ask questions and all were answered. The patient agreed with the plan and demonstrated an understanding of the instructions.  Patient advised to call back or seek an in-person evaluation if the symptoms or condition worsens.  Duration of encounter: *** minutes.   Total time on encounter, as per AMA guidelines included both the face-to-face and non-face-to-face time personally spent by the physician and/or other qualified health care professional(s) on the day of the encounter (includes time in activities that require the physician or other qualified health care professional and does not include time in activities normally performed by clinical staff). Physician's time may include the following activities when performed: Preparing to see the patient (e.g., pre-charting review of records, searching for previously ordered imaging, lab work, and nerve conduction tests) Review of prior analgesic pharmacotherapies. Reviewing PMP Interpreting ordered tests (e.g., lab work, imaging, nerve conduction tests) Performing post-procedure evaluations, including interpretation of diagnostic procedures Obtaining and/or reviewing separately obtained history Performing a medically appropriate examination and/or evaluation Counseling and educating the patient/family/caregiver Ordering medications, tests, or procedures Referring and communicating with other health care professionals (when not separately reported) Documenting clinical information in the electronic or other health record Independently interpreting results (not separately reported) and communicating results to the patient/ family/caregiver Care coordination (not separately reported)  Note by: Rodnisha Blomgren K Chene Kasinger, NP (TTS and AI technology used. I apologize for any typographical errors that were not detected and corrected.) Date: 02/15/2024; Time: 8:01 AM

## 2024-02-14 ENCOUNTER — Encounter
Admission: RE | Admit: 2024-02-14 | Discharge: 2024-02-14 | Disposition: A | Discharge status: Home / Self Care | Source: Ambulatory Visit | Attending: General Surgery | Admitting: General Surgery

## 2024-02-14 ENCOUNTER — Ambulatory Visit: Payer: Self-pay | Admitting: General Surgery

## 2024-02-14 ENCOUNTER — Other Ambulatory Visit: Payer: Self-pay

## 2024-02-14 DIAGNOSIS — I10 Essential (primary) hypertension: Secondary | ICD-10-CM

## 2024-02-14 DIAGNOSIS — D631 Anemia in chronic kidney disease: Secondary | ICD-10-CM

## 2024-02-14 DIAGNOSIS — Z794 Long term (current) use of insulin: Secondary | ICD-10-CM

## 2024-02-14 DIAGNOSIS — C50011 Malignant neoplasm of nipple and areola, right female breast: Secondary | ICD-10-CM

## 2024-02-14 DIAGNOSIS — N6489 Other specified disorders of breast: Secondary | ICD-10-CM

## 2024-02-14 HISTORY — DX: Pneumonia, unspecified organism: J18.9

## 2024-02-14 NOTE — Progress Notes (Signed)
"  ° °  History of Present Illness Jocelyn Wells is a 69 year old female with estrogen receptor positive left breast cancer, status post bilateral mastectomies, who presents for evaluation of a right chest post-mastectomy wound complication.  She underwent bilateral mastectomies in December 2025. Three days postoperatively, the right-sided drain was inadvertently removed, after which she developed significant seroma formation in the middle portion of the right chest wound, described as 'full of liquid.'  She sustained a fall at home, landing sideways and becoming entangled in a baker's rack. She is uncertain if there was direct trauma to the chest, but does not recall a specific impact. She notes increased wound fullness following the fall.  Home nursing visits have been ongoing, with a new nurse visiting yesterday after a one-week interval. She generally feels unwell since her recent surgery.   PAST SURGICAL HISTORY:   Past Surgical History:  Procedure Laterality Date   CESAREAN SECTION  1978   HYSTERECTOMY VAGINAL  2000   ARTHROSCOPY KNEE  2005   ulnar nerve transplant  2008   COLONOSCOPY  09/22/2016   PH Colon Polyps in past: CBF 09/2021   EGD  09/22/2016   Gastric Ulcer: CBF 12/2016   EGD  12/08/2016   No repeat per RTE   PHOTOREFRACTIVE KERATOTOMY/LASIK Bilateral 12/09/2020   OTHER SURGERY Left 07/10/2022   Fistula L Arm   MASTECTOMY BILATERAL SIMPLE Bilateral 01/18/2024   Dr Lucas Cintron [facility]   broken ankle  2002&2003   COLONOSCOPY     [facility] in [place] MD   COLONOSCOPY     Repeat Colon 2 yrs later in [place]   HYSTERECTOMY           PHYSICAL EXAM:  Vitals:   02/14/24 0911  BP: (!) 143/80  Pulse: 77  .  Ht:167.6 cm (5' 6) Wt:93.9 kg (207 lb) ADJ:Anib surface area is 2.09 meters squared. Body mass index is 33.41 kg/m.SABRA   GENERAL: Alert, active, oriented x3  HEART: regular rate  LUNGS: clear to auscultation  CHEST: Right chest  necrotic patch at the medial portion with significant increase in seroma size.  No overlying cellulitis.   Assessment & Plan Post-mastectomy wound complication of the right chest She developed a significant right chest wound complication with fluid accumulation after early accidental drain removal following a recent mastectomy. The absence of a drain and persistent fluid collection increase the risk of infection and impaired wound healing. Chemotherapy port placement and initiation are deferred due to elevated infection risk until adequate wound management is achieved. Scheduled operative wound cleaning and drainage of deep fluid collection through mastotomy under sedation for tomorrow aims to evacuate fluid, reduce infection risk, and promote healing. The skin will be approximated, leaving a portion open for continued drainage. Chemotherapy will commence once the wound is stable and infection risk is reduced.  Seroma of breast [N64.89]         Patient and her husband verbalized understanding, all questions were answered, and were agreeable with the plan outlined above.   Lucas Sjogren, MD "

## 2024-02-14 NOTE — Patient Instructions (Addendum)
 Your procedure is scheduled on: 02/15/24 - Wednesday Report to the Registration Desk on the 1st floor of the [facility]. To find out your arrival time, please call [phone] between 1PM - 3PM on: 02/14/24 - Tuesday If your arrival time is 6:00 am, do not arrive before that time as the [facility] entrance doors do not open until 6:00 am.  REMEMBER: Instructions that are not followed completely may result in serious medical risk, up to and including death; or upon the discretion of your surgeon and anesthesiologist your surgery may need to be rescheduled.  Do not eat food or drink any liquids after midnight the night before surgery.  No gum chewing or hard candies.  One week prior to surgery: Stop Anti-inflammatories (NSAIDS) such as Advil, Aleve, Ibuprofen, Motrin, Naproxen, Naprosyn and Aspirin  based products such as Excedrin, Goody's Powder, BC Powder.  Stop ANY OVER THE COUNTER supplements until after surgery.  You may take Tylenol  if needed for pain up until the day of surgery.  HOLD ELIQUIS  dose this evening on 01/06, and your morning dose on 01/07.  ON THE DAY OF SURGERY ONLY TAKE THESE MEDICATIONS WITH SIPS OF WATER :  buPROPion  (WELLBUTRIN  ) gabapentin  (NEURONTIN ) PARoxetine  (PAXIL )  traMADol  (ULTRAM ) if needed hydroxyurea  (HYDREA )    No Alcohol for 24 hours before or after surgery.  No Smoking including e-cigarettes for 24 hours before surgery.  No chewable tobacco products for at least 6 hours before surgery.  No nicotine patches on the day of surgery.  Do not use any recreational drugs for at least a week (preferably 2 weeks) before your surgery.  Please be advised that the combination of cocaine and anesthesia may have negative outcomes, up to and including death. If you test positive for cocaine, your surgery will be cancelled.  On the morning of surgery brush your teeth with toothpaste and water , you may rinse your mouth with mouthwash if you wish. Do  not swallow any toothpaste or mouthwash.  Use CHG Soap or wipes as directed on instruction sheet.  Do not wear jewelry, make-up, hairpins, clips or nail polish.  For welded (permanent) jewelry: bracelets, anklets, waist bands, etc.  Please have this removed prior to surgery.  If it is not removed, there is a chance that hospital personnel will need to cut it off on the day of surgery.  Do not wear lotions, powders, or perfumes.   Do not shave body hair from the neck down 48 hours before surgery.  Contact lenses, hearing aids and dentures may not be worn into surgery.  Do not bring valuables to the hospital. [facility] is not responsible for any missing/lost belongings or valuables.   Notify your doctor if there is any change in your medical condition (cold, fever, infection).  Wear comfortable clothing (specific to your surgery type) to the hospital.  After surgery, you can help prevent lung complications by doing breathing exercises.  Take deep breaths and cough every 1-2 hours. Your doctor may order a device called an Incentive Spirometer to help you take deep breaths. When coughing or sneezing, hold a pillow firmly against your incision with both hands. This is called splinting. Doing this helps protect your incision. It also decreases belly discomfort.  If you are being admitted to the hospital overnight, leave your suitcase in the car. After surgery it may be brought to your room.  In case of increased patient census, it may be necessary for you, the patient, to continue your postoperative care  in the Same Day Surgery department.  If you are being discharged the day of surgery, you will not be allowed to drive home. You will need a responsible individual to drive you home and stay with you for 24 hours after surgery.   If you are taking public transportation, you will need to have a responsible individual with you.  Please call the Pre-admissions Testing Dept. at [phone] if you have any questions about these instructions.  Surgery Visitation Policy:  Patients having surgery or a procedure may have two visitors.  Children under the age of 70 must have an adult with them who is not the patient.  Inpatient Visitation:    Visiting hours are 7 a.m. to 8 p.m. Up to four visitors are allowed at one time in a patient room. The visitors may rotate out with other people during the day.  One visitor age 70 or older may stay stay with the patient overnight and must be in the room by 8 p.m.   Merchandiser, Retail to address health-related social needs:  [email or web address]

## 2024-02-14 NOTE — H&P (View-Only) (Signed)
"  ° °  History of Present Illness Jocelyn Wells is a 69 year old female with estrogen receptor positive left breast cancer, status post bilateral mastectomies, who presents for evaluation of a right chest post-mastectomy wound complication.  She underwent bilateral mastectomies in December 2025. Three days postoperatively, the right-sided drain was inadvertently removed, after which she developed significant seroma formation in the middle portion of the right chest wound, described as 'full of liquid.'  She sustained a fall at home, landing sideways and becoming entangled in a baker's rack. She is uncertain if there was direct trauma to the chest, but does not recall a specific impact. She notes increased wound fullness following the fall.  Home nursing visits have been ongoing, with a new nurse visiting yesterday after a one-week interval. She generally feels unwell since her recent surgery.   PAST SURGICAL HISTORY:   Past Surgical History:  Procedure Laterality Date   CESAREAN SECTION  1978   HYSTERECTOMY VAGINAL  2000   ARTHROSCOPY KNEE  2005   ulnar nerve transplant  2008   COLONOSCOPY  09/22/2016   PH Colon Polyps in past: CBF 09/2021   EGD  09/22/2016   Gastric Ulcer: CBF 12/2016   EGD  12/08/2016   No repeat per RTE   PHOTOREFRACTIVE KERATOTOMY/LASIK Bilateral 12/09/2020   OTHER SURGERY Left 07/10/2022   Fistula L Arm   MASTECTOMY BILATERAL SIMPLE Bilateral 01/18/2024   Dr Lucas Cintron [facility]   broken ankle  2002&2003   COLONOSCOPY     [facility] in [place] MD   COLONOSCOPY     Repeat Colon 2 yrs later in [place]   HYSTERECTOMY           PHYSICAL EXAM:  Vitals:   02/14/24 0911  BP: (!) 143/80  Pulse: 77  .  Ht:167.6 cm (5' 6) Wt:93.9 kg (207 lb) ADJ:Anib surface area is 2.09 meters squared. Body mass index is 33.41 kg/m.SABRA   GENERAL: Alert, active, oriented x3  HEART: regular rate  LUNGS: clear to auscultation  CHEST: Right chest  necrotic patch at the medial portion with significant increase in seroma size.  No overlying cellulitis.   Assessment & Plan Post-mastectomy wound complication of the right chest She developed a significant right chest wound complication with fluid accumulation after early accidental drain removal following a recent mastectomy. The absence of a drain and persistent fluid collection increase the risk of infection and impaired wound healing. Chemotherapy port placement and initiation are deferred due to elevated infection risk until adequate wound management is achieved. Scheduled operative wound cleaning and drainage of deep fluid collection through mastotomy under sedation for tomorrow aims to evacuate fluid, reduce infection risk, and promote healing. The skin will be approximated, leaving a portion open for continued drainage. Chemotherapy will commence once the wound is stable and infection risk is reduced.  Seroma of breast [N64.89]         Patient and her husband verbalized understanding, all questions were answered, and were agreeable with the plan outlined above.   Lucas Sjogren, MD "

## 2024-02-15 ENCOUNTER — Encounter: Admitting: Nurse Practitioner

## 2024-02-15 ENCOUNTER — Ambulatory Visit
Admission: RE | Admit: 2024-02-15 | Discharge: 2024-02-15 | Disposition: A | Discharge status: Home / Self Care | Attending: General Surgery | Admitting: General Surgery

## 2024-02-15 ENCOUNTER — Ambulatory Visit: Payer: Self-pay | Admitting: Anesthesiology

## 2024-02-15 ENCOUNTER — Encounter: Payer: Self-pay | Admitting: Oncology

## 2024-02-15 ENCOUNTER — Other Ambulatory Visit: Payer: Self-pay

## 2024-02-15 ENCOUNTER — Encounter: Payer: Self-pay | Admitting: General Surgery

## 2024-02-15 ENCOUNTER — Inpatient Hospital Stay: Admitting: Oncology

## 2024-02-15 DIAGNOSIS — F17200 Nicotine dependence, unspecified, uncomplicated: Secondary | ICD-10-CM | POA: Insufficient documentation

## 2024-02-15 DIAGNOSIS — F32A Depression, unspecified: Secondary | ICD-10-CM | POA: Diagnosis not present

## 2024-02-15 DIAGNOSIS — F419 Anxiety disorder, unspecified: Secondary | ICD-10-CM | POA: Insufficient documentation

## 2024-02-15 DIAGNOSIS — Z8673 Personal history of transient ischemic attack (TIA), and cerebral infarction without residual deficits: Secondary | ICD-10-CM | POA: Insufficient documentation

## 2024-02-15 DIAGNOSIS — N189 Chronic kidney disease, unspecified: Secondary | ICD-10-CM | POA: Insufficient documentation

## 2024-02-15 DIAGNOSIS — I739 Peripheral vascular disease, unspecified: Secondary | ICD-10-CM | POA: Insufficient documentation

## 2024-02-15 DIAGNOSIS — Z9013 Acquired absence of bilateral breasts and nipples: Secondary | ICD-10-CM | POA: Diagnosis not present

## 2024-02-15 DIAGNOSIS — M96843 Postprocedural seroma of a musculoskeletal structure following other procedure: Secondary | ICD-10-CM | POA: Insufficient documentation

## 2024-02-15 DIAGNOSIS — Z794 Long term (current) use of insulin: Secondary | ICD-10-CM

## 2024-02-15 DIAGNOSIS — I129 Hypertensive chronic kidney disease with stage 1 through stage 4 chronic kidney disease, or unspecified chronic kidney disease: Secondary | ICD-10-CM | POA: Diagnosis not present

## 2024-02-15 DIAGNOSIS — R0602 Shortness of breath: Secondary | ICD-10-CM | POA: Diagnosis not present

## 2024-02-15 DIAGNOSIS — E1122 Type 2 diabetes mellitus with diabetic chronic kidney disease: Secondary | ICD-10-CM | POA: Insufficient documentation

## 2024-02-15 DIAGNOSIS — T8189XA Other complications of procedures, not elsewhere classified, initial encounter: Secondary | ICD-10-CM | POA: Insufficient documentation

## 2024-02-15 DIAGNOSIS — J449 Chronic obstructive pulmonary disease, unspecified: Secondary | ICD-10-CM | POA: Diagnosis not present

## 2024-02-15 DIAGNOSIS — R519 Headache, unspecified: Secondary | ICD-10-CM | POA: Insufficient documentation

## 2024-02-15 DIAGNOSIS — Z170 Estrogen receptor positive status [ER+]: Secondary | ICD-10-CM | POA: Insufficient documentation

## 2024-02-15 DIAGNOSIS — W19XXXA Unspecified fall, initial encounter: Secondary | ICD-10-CM | POA: Diagnosis not present

## 2024-02-15 DIAGNOSIS — E1151 Type 2 diabetes mellitus with diabetic peripheral angiopathy without gangrene: Secondary | ICD-10-CM | POA: Diagnosis not present

## 2024-02-15 DIAGNOSIS — I10 Essential (primary) hypertension: Secondary | ICD-10-CM

## 2024-02-15 DIAGNOSIS — C50812 Malignant neoplasm of overlapping sites of left female breast: Secondary | ICD-10-CM | POA: Insufficient documentation

## 2024-02-15 DIAGNOSIS — G4730 Sleep apnea, unspecified: Secondary | ICD-10-CM | POA: Insufficient documentation

## 2024-02-15 DIAGNOSIS — D631 Anemia in chronic kidney disease: Secondary | ICD-10-CM | POA: Diagnosis not present

## 2024-02-15 HISTORY — PX: INCISION AND DRAINAGE, ABSCESS, BREAST: SHX7594

## 2024-02-15 LAB — BASIC METABOLIC PANEL WITH GFR
Anion gap: 12 (ref 5–15)
BUN: 29 mg/dL — ABNORMAL HIGH (ref 8–23)
CO2: 27 mmol/L (ref 22–32)
Calcium: 9.6 mg/dL (ref 8.9–10.3)
Chloride: 101 mmol/L (ref 98–111)
Creatinine, Ser: 3.35 mg/dL — ABNORMAL HIGH (ref 0.44–1.00)
GFR, Estimated: 14 mL/min — ABNORMAL LOW
Glucose, Bld: 202 mg/dL — ABNORMAL HIGH (ref 70–99)
Potassium: 3.6 mmol/L (ref 3.5–5.1)
Sodium: 139 mmol/L (ref 135–145)

## 2024-02-15 LAB — GLUCOSE, CAPILLARY
Glucose-Capillary: 168 mg/dL — ABNORMAL HIGH (ref 70–99)
Glucose-Capillary: 175 mg/dL — ABNORMAL HIGH (ref 70–99)

## 2024-02-15 LAB — CBC
HCT: 33.7 % — ABNORMAL LOW (ref 36.0–46.0)
Hemoglobin: 10.4 g/dL — ABNORMAL LOW (ref 12.0–15.0)
MCH: 30.1 pg (ref 26.0–34.0)
MCHC: 30.9 g/dL (ref 30.0–36.0)
MCV: 97.4 fL (ref 80.0–100.0)
Platelets: 601 K/uL — ABNORMAL HIGH (ref 150–400)
RBC: 3.46 MIL/uL — ABNORMAL LOW (ref 3.87–5.11)
RDW: 15.7 % — ABNORMAL HIGH (ref 11.5–15.5)
WBC: 14.4 K/uL — ABNORMAL HIGH (ref 4.0–10.5)
nRBC: 0 % (ref 0.0–0.2)

## 2024-02-15 SURGERY — INCISION AND DRAINAGE, ABSCESS, BREAST
Anesthesia: Monitor Anesthesia Care | Site: Breast | Laterality: Right

## 2024-02-15 MED ORDER — OXYCODONE HCL 5 MG PO TABS
ORAL_TABLET | ORAL | Status: AC
  Filled 2024-02-15: qty 1

## 2024-02-15 MED ORDER — ACETAMINOPHEN 10 MG/ML IV SOLN
INTRAVENOUS | Status: AC
  Filled 2024-02-15: qty 100

## 2024-02-15 MED ORDER — OXYCODONE HCL 5 MG PO TABS
5.0000 mg | ORAL_TABLET | Freq: Once | ORAL | Status: AC | PRN
  Administered 2024-02-15: 5 mg via ORAL

## 2024-02-15 MED ORDER — IRRISEPT - 450ML BOTTLE WITH 0.05% CHG IN STERILE WATER, USP 99.95% OPTIME
TOPICAL | Status: DC | PRN
  Administered 2024-02-15: 450 mL

## 2024-02-15 MED ORDER — DROPERIDOL 2.5 MG/ML IJ SOLN: 0.6250 mg | Freq: Once | INTRAMUSCULAR | Status: DC | PRN

## 2024-02-15 MED ORDER — TRAMADOL HCL 50 MG PO TABS
50.0000 mg | ORAL_TABLET | Freq: Four times a day (QID) | ORAL | 0 refills | Status: AC | PRN
  Filled 2024-02-15: qty 10, 3d supply, fill #0

## 2024-02-15 MED ORDER — FENTANYL CITRATE (PF) 100 MCG/2ML IJ SOLN
INTRAMUSCULAR | Status: DC | PRN
  Administered 2024-02-15: 25 ug via INTRAVENOUS

## 2024-02-15 MED ORDER — ACETAMINOPHEN 10 MG/ML IV SOLN
INTRAVENOUS | Status: DC | PRN
  Administered 2024-02-15: 1000 mg via INTRAVENOUS

## 2024-02-15 MED ORDER — MIDAZOLAM HCL (PF) 2 MG/2ML IJ SOLN
INTRAMUSCULAR | Status: DC | PRN
  Administered 2024-02-15: 2 mg via INTRAVENOUS

## 2024-02-15 MED ORDER — CHLORHEXIDINE GLUCONATE 0.12 % MT SOLN
15.0000 mL | Freq: Once | OROMUCOSAL | Status: AC
  Administered 2024-02-15: 15 mL via OROMUCOSAL

## 2024-02-15 MED ORDER — CHLORHEXIDINE GLUCONATE 0.12 % MT SOLN
OROMUCOSAL | Status: AC
  Filled 2024-02-15: qty 15

## 2024-02-15 MED ORDER — CEFAZOLIN SODIUM-DEXTROSE 2-4 GM/100ML-% IV SOLN
INTRAVENOUS | Status: AC
  Filled 2024-02-15: qty 100

## 2024-02-15 MED ORDER — PROPOFOL 10 MG/ML IV BOLUS
INTRAVENOUS | Status: AC
  Filled 2024-02-15: qty 20

## 2024-02-15 MED ORDER — LIDOCAINE HCL (PF) 2 % IJ SOLN
INTRAMUSCULAR | Status: AC
  Filled 2024-02-15: qty 5

## 2024-02-15 MED ORDER — DEXMEDETOMIDINE HCL IN NACL 80 MCG/20ML IV SOLN
INTRAVENOUS | Status: AC
  Filled 2024-02-15: qty 20

## 2024-02-15 MED ORDER — CEFAZOLIN SODIUM-DEXTROSE 2-4 GM/100ML-% IV SOLN: 2.0000 g | INTRAVENOUS | Status: DC

## 2024-02-15 MED ORDER — ORAL CARE MOUTH RINSE: 15.0000 mL | Freq: Once | OROMUCOSAL | Status: AC

## 2024-02-15 MED ORDER — ONDANSETRON HCL 4 MG/2ML IJ SOLN
INTRAMUSCULAR | Status: AC
  Filled 2024-02-15: qty 2

## 2024-02-15 MED ORDER — SODIUM CHLORIDE 0.9 % IV SOLN: INTRAVENOUS | Status: DC

## 2024-02-15 MED ORDER — PHENYLEPHRINE 80 MCG/ML (10ML) SYRINGE FOR IV PUSH (FOR BLOOD PRESSURE SUPPORT)
PREFILLED_SYRINGE | INTRAVENOUS | Status: DC | PRN
  Administered 2024-02-15: 80 ug via INTRAVENOUS

## 2024-02-15 MED ORDER — MIDAZOLAM HCL 2 MG/2ML IJ SOLN
INTRAMUSCULAR | Status: AC
  Filled 2024-02-15: qty 2

## 2024-02-15 MED ORDER — FENTANYL CITRATE (PF) 100 MCG/2ML IJ SOLN
INTRAMUSCULAR | Status: AC
  Filled 2024-02-15: qty 2

## 2024-02-15 MED ORDER — PROPOFOL 10 MG/ML IV BOLUS
INTRAVENOUS | Status: DC | PRN
  Administered 2024-02-15: 20 mg via INTRAVENOUS

## 2024-02-15 MED ORDER — CEFAZOLIN SODIUM-DEXTROSE 2-4 GM/100ML-% IV SOLN
2.0000 g | INTRAVENOUS | Status: AC
  Administered 2024-02-15: 2 g via INTRAVENOUS

## 2024-02-15 MED ORDER — DEXAMETHASONE SOD PHOSPHATE PF 10 MG/ML IJ SOLN
INTRAMUSCULAR | Status: AC
  Filled 2024-02-15: qty 1

## 2024-02-15 MED ORDER — OXYCODONE HCL 5 MG/5ML PO SOLN: 5.0000 mg | Freq: Once | ORAL | Status: AC | PRN

## 2024-02-15 MED ORDER — FENTANYL CITRATE (PF) 100 MCG/2ML IJ SOLN: 25.0000 ug | INTRAMUSCULAR | Status: DC | PRN

## 2024-02-15 MED ORDER — ACETAMINOPHEN 10 MG/ML IV SOLN: 1000.0000 mg | Freq: Once | INTRAVENOUS | Status: DC | PRN

## 2024-02-15 SURGICAL SUPPLY — 25 items
BLADE CLIPPER SURG (BLADE) ×1 IMPLANT
BLADE SURG SZ11 CARB STEEL (BLADE) ×1 IMPLANT
BNDG GAUZE DERMACEA FLUFF 4 (GAUZE/BANDAGES/DRESSINGS) IMPLANT
CHLORAPREP W/TINT 26 (MISCELLANEOUS) IMPLANT
DRAIN PENROSE 0.5X18 (DRAIN) IMPLANT
DRAPE LAPAROTOMY 77X122 PED (DRAPES) ×1 IMPLANT
ELECTRODE REM PT RTRN 9FT ADLT (ELECTROSURGICAL) ×1 IMPLANT
GAUZE PACKING IODOFORM 1/2INX (GAUZE/BANDAGES/DRESSINGS) IMPLANT
GAUZE SPONGE 4X4 12PLY STRL (GAUZE/BANDAGES/DRESSINGS) IMPLANT
GLOVE BIO SURGEON STRL SZ 6.5 (GLOVE) ×1 IMPLANT
GLOVE BIOGEL PI IND STRL 6.5 (GLOVE) ×1 IMPLANT
GLOVE SURG SYN 6.5 PF PI (GLOVE) ×2 IMPLANT
GOWN STRL REUS W/ TWL LRG LVL3 (GOWN DISPOSABLE) ×3 IMPLANT
KIT TURNOVER KIT A (KITS) ×1 IMPLANT
MANIFOLD NEPTUNE II (INSTRUMENTS) ×1 IMPLANT
NEEDLE HYPO 22X1.5 SAFETY MO (MISCELLANEOUS) ×1 IMPLANT
PACK BASIN MINOR ARMC (MISCELLANEOUS) ×1 IMPLANT
PAD ABD DERMACEA PRESS 5X9 (GAUZE/BANDAGES/DRESSINGS) IMPLANT
SOLN 0.9% NACL POUR BTL 1000ML (IV SOLUTION) ×1 IMPLANT
SOLN STERILE WATER 500 ML (IV SOLUTION) ×1 IMPLANT
SOLUTION PREP PVP 2OZ (MISCELLANEOUS) IMPLANT
SPONGE T-LAP 18X18 ~~LOC~~+RFID (SPONGE) ×1 IMPLANT
SUT ETHILON 2 0 FS 18 (SUTURE) IMPLANT
SUT MON AB 3-0 SH27 (SUTURE) IMPLANT
TRAP FLUID SMOKE EVACUATOR (MISCELLANEOUS) ×1 IMPLANT

## 2024-02-15 NOTE — Anesthesia Procedure Notes (Signed)
 Procedure Name: MAC Date/Time: 02/15/2024 8:43 AM  Performed by: Dominica Krabbe, CRNAPre-anesthesia Checklist: Patient identified, Emergency Drugs available, Suction available, Patient being monitored and Timeout performed Patient Re-evaluated:Patient Re-evaluated prior to induction Oxygen  Delivery Method: Simple face mask Preoxygenation: Pre-oxygenation with 100% oxygen  Induction Type: IV induction

## 2024-02-15 NOTE — Op Note (Addendum)
 Preoperative diagnosis: Seroma of right breast   Postoperative diagnosis: Same  Procedure: Mastotomy with drainage of large right breast fluid collection  Anesthesia: MAC  Surgeon: Dr. Rodolph, MD  Wound Classification: Contaminated  Indications: Patient is a 69 y.o. female  presented with a large seroma of the right chest after accidentally removing the drain after mastectomy.    Findings:  1.  Large fluid collection of the right chest without sign of purulence or cellulitis   Description of procedure: The patient was placed in the supine position and MAC was induced. The right chest was prepped and draped in the usual sterile fashion. A timeout was completed verifying correct patient, procedure, site, positioning, and implant(s) and/or special equipment prior to beginning this procedure. A small necrotic patch on the medial aspect of the wound was incised and a large mastotomy was performed.  The right breast was entered and a deep large fluid collection was drained.  No purulence or infected tissue was identified within the right breast.  The right breast cavity was suction with Yankauer.  Then the whole cavity was irrigated with Irrisept.  The mastectomy wound was partially closed with intermittent mattress suture.  Penrose was left in place for continuous drainage and avoiding recurrence of seroma or infection. The patient tolerated the procedure well and was taken to the postanesthesia care unit in satisfactory condition.   Specimen: None  Complications: None  Estimated Blood Loss: Minimal

## 2024-02-15 NOTE — Transfer of Care (Signed)
 Immediate Anesthesia Transfer of Care Note  Patient: Jocelyn Wells  Procedure(s) Performed: INCISION AND DRAINAGE, ABSCESS, BREAST (Right: Breast)  Patient Location: PACU  Anesthesia Type:MAC  Level of Consciousness: awake, alert , and oriented  Airway & Oxygen  Therapy: Patient Spontanous Breathing and Patient connected to face mask oxygen   Post-op Assessment: Report given to RN and Post -op Vital signs reviewed and stable  Post vital signs: Reviewed and stable  Last Vitals:  Vitals Value Taken Time  BP 110/68 02/15/24 09:27  Temp    Pulse 78 02/15/24 09:27  Resp 30 02/15/24 09:27  SpO2 100 % 02/15/24 09:27  Vitals shown include unfiled device data.  Last Pain:  Vitals:   02/15/24 0733  TempSrc: Temporal  PainSc: 5          Complications: No notable events documented.

## 2024-02-15 NOTE — Anesthesia Preprocedure Evaluation (Signed)
 "                                  Anesthesia Evaluation  Patient identified by MRN, date of birth, ID band Patient awake    Reviewed: Allergy & Precautions, H&P , NPO status , Patient's Chart, lab work & pertinent test results, reviewed documented beta blocker date and time   History of Anesthesia Complications (+) history of anesthetic complications  Airway Mallampati: II   Neck ROM: full    Dental  (+) Poor Dentition, Edentulous Upper, Edentulous Lower   Pulmonary shortness of breath and with exertion, sleep apnea and Continuous Positive Airway Pressure Ventilation , pneumonia, resolved, COPD,  COPD inhaler, Current Smoker   Pulmonary exam normal        Cardiovascular Exercise Tolerance: Poor hypertension, On Medications + Peripheral Vascular Disease and + DOE  Normal cardiovascular exam+ dysrhythmias  Rhythm:regular Rate:Normal     Neuro/Psych  Headaches PSYCHIATRIC DISORDERS Anxiety Depression    CVA    GI/Hepatic negative GI ROS, Neg liver ROS,,,  Endo/Other  negative endocrine ROSdiabetes    Renal/GU Renal disease  negative genitourinary   Musculoskeletal   Abdominal   Peds  Hematology  (+) Blood dyscrasia, anemia   Anesthesia Other Findings Past Medical History: 01/29/2015: Acute ischemic left MCA stroke (HCC)     Comment:  a.) MRI brain 01/29/2015: acute left MCA territory               infarcts involving the posterior left frontal lobe and               left insular cortex No date: Allergy No date: Anemia of chronic renal failure No date: Anxiety No date: Aortic atherosclerosis No date: Arthritis No date: Complication of anesthesia     Comment:  a.) PONV; b.) awareness under anesthesia No date: COPD (chronic obstructive pulmonary disease) (HCC) No date: Depression No date: Dyspnea No date: ESRD (end stage renal disease) on dialysis (M-W-F) No date: Headache No date: HFrEF (heart failure with reduced ejection fraction) (HCC)      Comment:  a.) TTE 01/30/2015: EF 30-35%, diff HK, LAE, G2DD; b.)               TTE 06/13/2015: EF 40-45%, diff HK, G1DD; c.) TTE               10/06/2017: EF 60-65% MAC, no IAS; d.) TTE 09/29/2021: EF              45-50%;, LVH, LAE, AoV sclerosis, G1DD; e.) TTE               02/24/2022: EF 60-65%, no IAS No date: HTN (hypertension) No date: Leukocytosis No date: Malignant neoplasm of left breast (HCC) No date: NICM (nonischemic cardiomyopathy) (HCC)     Comment:  a.) TTE 01/30/2015: EF 30-35%; b.) R/LHC 03/25/2015: EF               25-30%, mRA 13, mPA 30, mPCWP 26, LVEDP 17, CO 3.66, CI               1.8; c.) TTE 06/13/2015: EF 40-45%; d.) TTE 10/06/2017:               EF 60-65%; e.) TTE 09/29/2021: EF 45-50%; f.) TTE               02/24/2022: EF 60-65% No date: Obesity No date: On  apixaban  therapy No date: On supplemental oxygen  by nasal cannula     Comment:  a.) 2L/[place] at night No date: Osteoporosis No date: Peripheral vascular disease No date: Pneumonia No date: 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 No date: Type 2 diabetes mellitus treated with insulin  [facility]) Past Surgical History: No date: ABDOMINAL HYSTERECTOMY 02/09/2000: ANKLE FRACTURE SURGERY; Left 06/18/2022: AV FISTULA PLACEMENT; Left     Comment:  Procedure: ARTERIOVENOUS (AV) FISTULA CREATION (BRACHIAL              AXILLA);  Surgeon: Jama Cordella MATSU, MD;  Location:               [facility] ORS;  Service: Vascular;  Laterality: Left; 01/18/2024: AXILLARY SENTINEL NODE BIOPSY; Left     Comment:  Procedure: BIOPSY, LYMPH NODE, SENTINEL, AXILLARY;                Surgeon: Rodolph Romano, MD;  Location: [facility];               Service: General;  Laterality: Left; 02/08/1998: BILATERAL SALPINGOOPHORECTOMY No date: BREAST BIOPSY; Left     Comment:  US  BX LEFT BREAST COIL CLIP-PATH PENDING 12/20/2023: BREAST BIOPSY; Left     Comment:  US  LT BREAST BX W LOC DEV 1ST LESION IMG BX SPEC US                 GUIDE 12/20/2023 [facility] 12/20/2023: BREAST BIOPSY; Left     Comment:  US  LT BREAST BX W LOC DEV EA ADD LESION IMG BX SPEC US                GUIDE 12/20/2023 [facility] 03/25/2015: CARDIAC CATHETERIZATION; N/A     Comment:  Procedure: Right/Left Heart Cath and Coronary               Angiography;  Surgeon: Victory LELON Sharps, MD;  Location: [facility]               INVASIVE CV [facility];  Service: Cardiovascular;  Laterality:               N/A; 02/09/1976: CESAREAN SECTION 09/22/2016: COLONOSCOPY WITH PROPOFOL ; N/A     Comment:  Procedure: COLONOSCOPY WITH PROPOFOL ;  Surgeon: Viktoria Lamar DASEN, MD;  Location: [facility] ENDOSCOPY;  Service:               Endoscopy;  Laterality: N/A; 02/15/2022: COLONOSCOPY WITH PROPOFOL ; N/A     Comment:  Procedure: COLONOSCOPY WITH PROPOFOL ;  Surgeon: Unk Corinn Skiff, MD;  Location: [facility] ENDOSCOPY;  Service:               Gastroenterology;  Laterality: N/A; 01/19/2023: DIALYSIS/PERMA CATHETER INSERTION; N/A     Comment:  Procedure: DIALYSIS/PERMA CATHETER INSERTION;  Surgeon:               Marea Selinda RAMAN, MD;  Location: [facility] INVASIVE CV [facility];                Service: Cardiovascular;  Laterality: N/A; 08/05/2015: EP IMPLANTABLE DEVICE; N/A     Comment:  Procedure: Loop Recorder Insertion;  Surgeon: Elspeth JAYSON Sage, MD;  Location: [facility] IN[facility] CV [facility];  Service:  Cardiovascular;  Laterality: N/A; 02/15/2022: ESOPHAGOGASTRODUODENOSCOPY; N/A     Comment:  Procedure: ESOPHAGOGASTRODUODENOSCOPY (EGD);  Surgeon:               Unk Corinn Skiff, MD;  Location: [facility] ENDOSCOPY;                Service: Gastroenterology;  Laterality: N/A; 09/22/2016: ESOPHAGOGASTRODUODENOSCOPY (EGD) WITH PROPOFOL ; N/A     Comment:  Procedure: ESOPHAGOGASTRODUODENOSCOPY (EGD) WITH               PROPOFOL ;  Surgeon: Viktoria Lamar DASEN, MD;  Location:               [facility] ENDOSCOPY;  Service: Endoscopy;  Laterality:  N/A; 12/08/2016: ESOPHAGOGASTRODUODENOSCOPY (EGD) WITH PROPOFOL ; N/A     Comment:  Procedure: ESOPHAGOGASTRODUODENOSCOPY (EGD) WITH               PROPOFOL ;  Surgeon: Viktoria Lamar DASEN, MD;  Location:               [facility] ENDOSCOPY;  Service: Endoscopy;  Laterality: N/A; 10/12/2022: ESOPHAGOGASTRODUODENOSCOPY (EGD) WITH PROPOFOL ; N/A     Comment:  Procedure: ESOPHAGOGASTRODUODENOSCOPY (EGD) WITH               PROPOFOL ;  Surgeon: Unk Corinn Skiff, MD;  Location:               [facility] ENDOSCOPY;  Service: Gastroenterology;  Laterality:               N/A; 12/27/2022: FACIAL LACERATION REPAIR; N/A     Comment:  Procedure: FACIAL LACERATION REPAIR;  Surgeon: Luciano Standing, MD;  Location: [facility] OR;  Service: ENT;  Laterality:              N/A; No date: FRACTURE SURGERY 04/19/2022: GIVENS CAPSULE STUDY; N/A     Comment:  Procedure: GIVENS CAPSULE STUDY;  Surgeon: Unk Corinn Skiff, MD;  Location: [facility] ENDOSCOPY;  Service:               Gastroenterology;  Laterality: N/A; 10/12/2022: GIVENS CAPSULE STUDY; N/A     Comment:  Procedure: GIVENS CAPSULE STUDY;  Surgeon: Unk Corinn Skiff, MD;  Location: [facility] ENDOSCOPY;  Service:               Gastroenterology;  Laterality: N/A; 06/18/2022: INSERTION HYBRID ANTERIOVENOUS GORTEX GRAFT     Comment:  Procedure: INSERTION HYBRID ANTERIOVENOUS GORTEX GRAFT;               Surgeon: Jama Cordella MATSU, MD;  Location: [facility];                Service: Vascular;; 02/09/2003: KNEE ARTHROSCOPY; Left 08/12/2021: LOWER EXTREMITY ANGIOGRAPHY; Left     Comment:  Procedure: Lower Extremity Angiography;  Surgeon:               Jama Cordella MATSU, MD;  Location: [facility] INVAS[facility] CV [facility];               Service: Cardiovascular;  Laterality: Left; 01/19/2023: PERIPHERAL VASCULAR THROMBECTOMY; Left     Comment:  Procedure: PERIPHERAL VASCULAR THROMBECTOMY;  Surgeon:               Marea Selinda RAMAN, MD;  Location: [facility] INVASIVE CV LAB;  Service: Cardiovascular;  Laterality: Left; 01/31/2015: TEE WITHOUT CARDIOVERSION; N/A     Comment:  Procedure: TRANSESOPHAGEAL ECHOCARDIOGRAM (TEE);                Surgeon: Redell GORMAN Shallow, MD;  Location: [facility] ENDOSCOPY;                Service: Cardiovascular;  Laterality: N/A; 08/14/2021: TEMPORARY DIALYSIS CATHETER; N/A     Comment:  Procedure: TEMPORARY DIALYSIS CATHETER;  Surgeon:               Jama Cordella MATSU, MD;  Location: [facility] INVASIVE CV LAB;               Service: Cardiovascular;  Laterality: N/A; 01/18/2024: TOTAL MASTECTOMY; Bilateral     Comment:  Procedure: MASTECTOMY, SIMPLE;  Surgeon: Rodolph Romano, MD;  Location: [facility];  Service: General;                Laterality: Bilateral; 02/09/1976: TUBAL LIGATION 02/08/2006: ULNAR NERVE TRANSPOSITION No date: US  LT BREAST BX W LOC DEV 1ST LESION IMG BX SPEC US  GUIDE;  Left     Comment:  US  BX LEFT BREAST RIBBON CLIP-PATH PENDING BMI    Body Mass Index: 32.88 kg/m     Reproductive/Obstetrics negative OB ROS                              Anesthesia Physical Anesthesia Plan  ASA: 3  Anesthesia Plan: General and MAC   Post-op Pain Management:    Induction:   PONV Risk Score and Plan: 4 or greater  Airway Management Planned:   Additional Equipment:   Intra-op Plan:   Post-operative Plan:   Informed Consent: I have reviewed the patients History and Physical, chart, labs and discussed the procedure including the risks, benefits and alternatives for the proposed anesthesia with the patient or authorized representative who has indicated his/her understanding and acceptance.     Dental Advisory Given  Plan Discussed with: CRNA  Anesthesia Plan Comments: (Based on D/w surgeon, he feels MAC will be appropriate for above and pt accepts this.  She is concerned about being adequately comfortable for the procedure. Based on risk profile, we will proceed with  mac and transiiton to lma if necessary. ja)        Anesthesia Quick Evaluation  "

## 2024-02-15 NOTE — Discharge Instructions (Addendum)
" °  Diet: Resume home heart healthy regular diet.   Activity: No heavy lifting >20 pounds (children, pets, laundry, garbage) or strenuous activity until follow-up, but light activity and walking are encouraged. Do not drive or drink alcohol if taking narcotic pain medications.  Wound care: May shower with soapy water  and pat dry (do not rub incisions), but no baths or submerging incision underwater until follow-up. (no swimming)   Medications: Resume all home medications. For mild to moderate pain: acetaminophen  (Tylenol ) or ibuprofen (if no kidney disease). Combining Tylenol  with alcohol can substantially increase your risk of causing liver disease. Narcotic pain medications, if prescribed, can be used for severe pain, though may cause nausea, constipation, and drowsiness. If you do not need the narcotic pain medication, you do not need to fill the prescription.  May restart Eliquis  tomorrow.   Call office [phone]) at any time if any questions, worsening pain, fevers/chills, bleeding, drainage from incision site, or other concerns.  "

## 2024-02-15 NOTE — Interval H&P Note (Signed)
 History and Physical Interval Note:  02/15/2024 7:37 AM  Jocelyn Wells  has presented today for surgery, with the diagnosis of C50.812, Z17.0 Mallignant neoplasm of overlapping sites of lt breast in female, estrogen receptor positive N64.89 Seroma of breast.  The various methods of treatment have been discussed with the patient and family. After consideration of risks, benefits and other options for treatment, the patient has consented to  Procedures: INCISION AND DRAINAGE, ABSCESS, BREAST (Right) as a surgical intervention.  The patient's history has been reviewed, patient examined, no change in status, stable for surgery.  I have reviewed the patient's chart and labs.  Questions were answered to the patient's satisfaction.     Lucas Sjogren

## 2024-02-16 ENCOUNTER — Encounter: Payer: Self-pay | Admitting: General Surgery

## 2024-02-16 ENCOUNTER — Inpatient Hospital Stay: Admitting: Oncology

## 2024-02-16 ENCOUNTER — Other Ambulatory Visit: Payer: Self-pay

## 2024-02-16 ENCOUNTER — Encounter: Admission: RE | Disposition: A | Payer: Self-pay | Attending: General Surgery

## 2024-02-16 ENCOUNTER — Inpatient Hospital Stay: Attending: Oncology | Admitting: Oncology

## 2024-02-16 DIAGNOSIS — Z9013 Acquired absence of bilateral breasts and nipples: Secondary | ICD-10-CM | POA: Diagnosis not present

## 2024-02-16 DIAGNOSIS — C50012 Malignant neoplasm of nipple and areola, left female breast: Secondary | ICD-10-CM | POA: Insufficient documentation

## 2024-02-16 DIAGNOSIS — Z1721 Progesterone receptor positive status: Secondary | ICD-10-CM | POA: Insufficient documentation

## 2024-02-16 DIAGNOSIS — Z170 Estrogen receptor positive status [ER+]: Secondary | ICD-10-CM | POA: Diagnosis not present

## 2024-02-16 DIAGNOSIS — M810 Age-related osteoporosis without current pathological fracture: Secondary | ICD-10-CM | POA: Insufficient documentation

## 2024-02-16 DIAGNOSIS — N186 End stage renal disease: Secondary | ICD-10-CM | POA: Insufficient documentation

## 2024-02-16 DIAGNOSIS — I120 Hypertensive chronic kidney disease with stage 5 chronic kidney disease or end stage renal disease: Secondary | ICD-10-CM | POA: Diagnosis not present

## 2024-02-16 DIAGNOSIS — Z992 Dependence on renal dialysis: Secondary | ICD-10-CM | POA: Diagnosis not present

## 2024-02-16 DIAGNOSIS — Z79899 Other long term (current) drug therapy: Secondary | ICD-10-CM | POA: Insufficient documentation

## 2024-02-16 DIAGNOSIS — D473 Essential (hemorrhagic) thrombocythemia: Secondary | ICD-10-CM | POA: Insufficient documentation

## 2024-02-16 DIAGNOSIS — F17210 Nicotine dependence, cigarettes, uncomplicated: Secondary | ICD-10-CM | POA: Diagnosis not present

## 2024-02-16 DIAGNOSIS — Z809 Family history of malignant neoplasm, unspecified: Secondary | ICD-10-CM | POA: Insufficient documentation

## 2024-02-16 DIAGNOSIS — Z1732 Human epidermal growth factor receptor 2 negative status: Secondary | ICD-10-CM | POA: Diagnosis not present

## 2024-02-16 DIAGNOSIS — D631 Anemia in chronic kidney disease: Secondary | ICD-10-CM | POA: Insufficient documentation

## 2024-02-16 NOTE — Anesthesia Postprocedure Evaluation (Signed)
"   Anesthesia Post Note  Patient: Jocelyn Wells  Procedure(s) Performed: INCISION AND DRAINAGE, ABSCESS, BREAST (Right: Breast)  Patient location during evaluation: PACU Anesthesia Type: MAC Level of consciousness: awake and alert Pain management: pain level controlled Vital Signs Assessment: post-procedure vital signs reviewed and stable Respiratory status: spontaneous breathing, nonlabored ventilation, respiratory function stable and patient connected to nasal cannula oxygen  Cardiovascular status: blood pressure returned to baseline and stable Postop Assessment: no apparent nausea or vomiting Anesthetic complications: no   No notable events documented.   Last Vitals:  Vitals:   02/15/24 1027 02/15/24 1035  BP:  (!) 97/57  Pulse: 76   Resp: 18   Temp: (!) 36.3 C   SpO2:  93%    Last Pain:  Vitals:   02/15/24 1027  TempSrc: Temporal  PainSc: 0-No pain                 Lynwood KANDICE Clause      "

## 2024-02-16 NOTE — Progress Notes (Unsigned)
 Multiple falls, 2-3 days ago.  Hit her head a little over a week ago, no LOC.  Pain 9/10 nightly pain chest where they did surgery. Pain 8/10 currently. Double masectomy 01/18/24. INCISION AND DRAINAGE, ABSCESS, BREAST done yesterday 02/15/24. Nausea maybe.  Numbness and tingling to hands

## 2024-02-18 ENCOUNTER — Encounter: Payer: Self-pay | Admitting: Oncology

## 2024-02-18 NOTE — Progress Notes (Signed)
 I connected with Jocelyn Wells on 02/18/2024 at 11:30 AM EST by video enabled telemedicine visit and verified that I am speaking with the correct person using two identifiers.   I discussed the limitations, risks, security and privacy concerns of performing an evaluation and management service by telemedicine and the availability of in-person appointments. I also discussed with the patient that there may be a patient responsible charge related to this service. The patient expressed understanding and agreed to proceed.  Other persons participating in the visit and their role in the encounter:  patients husband  Patient's location:  home Provider's location:  home  Chief Complaint:  routine f/u visit for breast cancer  History of present illness: Patient is a 69 year old female with a past medical history significant for ESRD on dialysis, hypertension hyperlipidemia COPD, history of stroke currently on Eliquis .  She has seen me in the past for iron  deficiency anemia and has required IV iron .Patient had an EGD and colonoscopy in January 2024 by Dr. Unk we did not show any evidence of bleeding.  Endoscopy revealed mild chronic gastritis that was negative for H. pylori.She also had a capsule study which did not show any active bleeding   Patient was also diagnosed with JAK2 positive thrombocytosis likely essential thrombocytosis for which she is on Hydrea    Patient self palpated a left breast mass which led to bilateral diagnostic mammogram On 12/14/2023.  This did not show any evidence of malignancy in the right breast but she was found to have multifocal left breast malignancy.  17 x 19 x 18 mm mass at the 4 o'clock position 3 cm from the nipple.  11 x 8 x 7 mm mass at the 8 o'clock position 3 cm from the nipple.  8 mm mass 4 cm from the nipple at the 9:30 position.  10 mm irregular hypoechoic mass at 9 o'clock position 7 cm from the nipple.  Targeted ultrasound of the left axilla did not show any  evidence of abnormal lymph nodes.  Patient had a core biopsy of 2 of the left breast masses at the 4:00 and 8 o'clock position and both of them were invasive mammary carcinoma with mucinous and micropapillary features overall grade 2.  Tumor was strongly ER/PR +100% and HER2 +1 negative.   Patient underwent b/ mastectomy without reconstruction on 01/18/24. Right breast pathology showed no malignancy. Left mastectomy pathology showed 7.2 cm grade 2 IDC with negative margins. 1/ 1 SLN positive for malignancy with ENE    Interval history patient has been recovering slowly from her mastectomy which was complicated by wound infection  and patient was taken to the OR for right chest wall abscess drainage on 02/15/2024.  Patient's husband tells me that she has had a few falls since then at home and she is quite frail and just about making it for her dialysis at this time   Review of Systems  Constitutional:  Positive for malaise/fatigue. Negative for chills, fever and weight loss.  HENT:  Negative for congestion, ear discharge and nosebleeds.   Eyes:  Negative for blurred vision.  Respiratory:  Negative for cough, hemoptysis, sputum production, shortness of breath and wheezing.   Cardiovascular:  Negative for chest pain, palpitations, orthopnea and claudication.  Gastrointestinal:  Negative for abdominal pain, blood in stool, constipation, diarrhea, heartburn, melena, nausea and vomiting.  Genitourinary:  Negative for dysuria, flank pain, frequency, hematuria and urgency.  Musculoskeletal:  Positive for falls. Negative for back pain, joint pain and myalgias.  Skin:  Negative for rash.  Neurological:  Negative for dizziness, tingling, focal weakness, seizures, weakness and headaches.  Endo/Heme/Allergies:  Does not bruise/bleed easily.  Psychiatric/Behavioral:  Negative for depression and suicidal ideas. The patient does not have insomnia.     Allergies[1]  Past Medical History:  Diagnosis Date    Acute ischemic left MCA stroke (HCC) 01/29/2015   a.) MRI brain 01/29/2015: acute left MCA territory infarcts involving the posterior left frontal lobe and left insular cortex   Allergy    Anemia of chronic renal failure    Anxiety    Aortic atherosclerosis    Arthritis    Complication of anesthesia    a.) PONV; b.) awareness under anesthesia   COPD (chronic obstructive pulmonary disease) (HCC)    Depression    Dyspnea    ESRD (end stage renal disease) on dialysis (M-W-F)    Headache    HFrEF (heart failure with reduced ejection fraction) (HCC)    a.) TTE 01/30/2015: EF 30-35%, diff HK, LAE, G2DD; b.) TTE 06/13/2015: EF 40-45%, diff HK, G1DD; c.) TTE 10/06/2017: EF 60-65% MAC, no IAS; d.) TTE 09/29/2021: EF 45-50%;, LVH, LAE, AoV sclerosis, G1DD; e.) TTE 02/24/2022: EF 60-65%, no IAS   HTN (hypertension)    Leukocytosis    Malignant neoplasm of left breast (HCC)    NICM (nonischemic cardiomyopathy) (HCC)    a.) TTE 01/30/2015: EF 30-35%; b.) R/LHC 03/25/2015: EF 25-30%, mRA 13, mPA 30, mPCWP 26, LVEDP 17, CO 3.66, CI 1.8; c.) TTE 06/13/2015: EF 40-45%; d.) TTE 10/06/2017: EF 60-65%; e.) TTE 09/29/2021: EF 45-50%; f.) TTE 02/24/2022: EF 60-65%   Obesity    On apixaban  therapy    On supplemental oxygen  by nasal cannula    a.) 2L/[place] at night   Osteoporosis    Peripheral vascular disease    Pneumonia    Right bundle branch block (RBBB) with left anterior fascicular block    Type 2 diabetes mellitus treated with insulin  [facility])     Past Surgical History:  Procedure Laterality Date   ABDOMINAL HYSTERECTOMY     ANKLE FRACTURE SURGERY Left 02/09/2000   AV FISTULA PLACEMENT Left 06/18/2022   Procedure: ARTERIOVENOUS (AV) FISTULA CREATION (BRACHIAL AXILLA);  Surgeon: Jama Cordella MATSU, MD;  Location: [facility] ORS;  Service: Vascular;  Laterality: Left;   AXILLARY SENTINEL NODE BIOPSY Left 01/18/2024   Procedure: BIOPSY, LYMPH NODE, SENTINEL, AXILLARY;  Surgeon: Rodolph Romano, MD;   Location: [facility] OR[facility];  Service: General;  Laterality: Left;   BILATERAL SALPINGOOPHORECTOMY  02/08/1998   BREAST BIOPSY Left    US  BX LEFT BREAST COIL CLIP-PATH PENDING   BREAST BIOPSY Left 12/20/2023   US  LT BREAST BX W LOC DEV 1ST LESION IMG BX SPEC US  GUIDE 12/20/2023 [facility]   BREAST BIOPSY Left 12/20/2023   US  LT BREAST BX W LOC DEV EA ADD LESION IMG BX SPEC US  GUIDE 12/20/2023 [facility]   CARDIAC CATHETERIZATION N/A 03/25/2015   Procedure: Right/Left Heart Cath and Coronary Angiography;  Surgeon: Victory LELON Sharps, MD;  Location: [facility] INVASIVE CV LAB;  Service: Cardiovascular;  Laterality: N/A;   CESAREAN SECTION  02/09/1976   COLONOSCOPY WITH PROPOFOL  N/A 09/22/2016   Procedure: COLONOSCOPY WITH PROPOFOL ;  Surgeon: Viktoria Lamar DASEN, MD;  Location: [facility] ENDOSCOPY;  Service: Endoscopy;  Laterality: N/A;   COLONOSCOPY WITH PROPOFOL  N/A 02/15/2022   Procedure: COLONOSCOPY WITH PROPOFOL ;  Surgeon: Unk Corinn Skiff, MD;  Location: [facility] ENDOSCOPY;  Service: Gastroenterology;  Laterality: N/A;   DIALYSIS/PERMA CATHETER INSERTION  N/A 01/19/2023   Procedure: DIALYSIS/PERMA CATHETER INSERTION;  Surgeon: Marea Selinda RAMAN, MD;  Location: [facility] INVASIVE CV LAB;  Service: Cardiovascular;  Laterality: N/A;   EP IMPLANTABLE DEVICE N/A 08/05/2015   Procedure: Loop Recorder Insertion;  Surgeon: Elspeth JAYSON Sage, MD;  Location: [facility] INVASIVE CV LAB;  Service: Cardiovascular;  Laterality: N/A;   ESOPHAGOGASTRODUODENOSCOPY N/A 02/15/2022   Procedure: ESOPHAGOGASTRODUODENOSCOPY (EGD);  Surgeon: Unk Corinn Skiff, MD;  Location: [facility] ENDOSCOPY;  Service: Gastroenterology;  Laterality: N/A;   ESOPHAGOGASTRODUODENOSCOPY (EGD) WITH PROPOFOL  N/A 09/22/2016   Procedure: ESOPHAGOGASTRODUODENOSCOPY (EGD) WITH PROPOFOL ;  Surgeon: Viktoria Lamar DASEN, MD;  Location: [facility] ENDOSCOPY;  Service: Endoscopy;  Laterality: N/A;   ESOPHAGOGASTRODUODENOSCOPY (EGD) WITH PROPOFOL  N/A 12/08/2016   Procedure:  ESOPHAGOGASTRODUODENOSCOPY (EGD) WITH PROPOFOL ;  Surgeon: Viktoria Lamar DASEN, MD;  Location: [facility] ENDOSCOPY;  Service: Endoscopy;  Laterality: N/A;   ESOPHAGOGASTRODUODENOSCOPY (EGD) WITH PROPOFOL  N/A 10/12/2022   Procedure: ESOPHAGOGASTRODUODENOSCOPY (EGD) WITH PROPOFOL ;  Surgeon: Unk Corinn Skiff, MD;  Location: [facility] ENDOSCOPY;  Service: Gastroenterology;  Laterality: N/A;   FACIAL LACERATION REPAIR N/A 12/27/2022   Procedure: FACIAL LACERATION REPAIR;  Surgeon: Luciano Elspeth, MD;  Location: [facility] OR;  Service: ENT;  Laterality: N/A;   FRACTURE SURGERY     GIVENS CAPSULE STUDY N/A 04/19/2022   Procedure: GIVENS CAPSULE STUDY;  Surgeon: Unk Corinn Skiff, MD;  Location: [facility] ENDOSCOPY;  Service: Gastroenterology;  Laterality: N/A;   GIVENS CAPSULE STUDY N/A 10/12/2022   Procedure: GIVENS CAPSULE STUDY;  Surgeon: Unk Corinn Skiff, MD;  Location: [facility] ENDOSCOPY;  Service: Gastroenterology;  Laterality: N/A;   INCISION AND DRAINAGE, ABSCESS, BREAST Right 02/15/2024   Procedure: INCISION AND DRAINAGE, ABSCESS, BREAST;  Surgeon: Rodolph Romano, MD;  Location: [facility] ORS;  Service: General;  Laterality: Right;   INSERTION HYBRID ANTERIOVENOUS GORTEX GRAFT  06/18/2022   Procedure: INSERTION HYBRID ANTERIOVENOUS GORTEX GRAFT;  Surgeon: Jama Cordella MATSU, MD;  Location: [facility] ORS;  Service: Vascular;;   KNEE ARTHROSCOPY Left 02/09/2003   LOWER EXTREMITY ANGIOGRAPHY Left 08/12/2021   Procedure: Lower Extremity Angiography;  Surgeon: Jama Cordella MATSU, MD;  Location: [facility] INVASIVE CV LAB;  Service: Cardiovascular;  Laterality: Left;   PERIPHERAL VASCULAR THROMBECTOMY Left 01/19/2023   Procedure: PERIPHERAL VASCULAR THROMBECTOMY;  Surgeon: Marea Selinda RAMAN, MD;  Location: [facility] INVASIVE CV LAB;  Service: Cardiovascular;  Laterality: Left;   TEE WITHOUT CARDIOVERSION N/A 01/31/2015   Procedure: TRANSESOPHAGEAL ECHOCARDIOGRAM (TEE);  Surgeon: Redell RAMAN Shallow, MD;  Location: [facility] ENDOSCOPY;  Service:  Cardiovascular;  Laterality: N/A;   TEMPORARY DIALYSIS CATHETER N/A 08/14/2021   Procedure: TEMPORARY DIALYSIS CATHETER;  Surgeon: Jama Cordella MATSU, MD;  Location: [facility] INVASIVE CV LAB;  Service: Cardiovascular;  Laterality: N/A;   TOTAL MASTECTOMY Bilateral 01/18/2024   Procedure: MASTECTOMY, SIMPLE;  Surgeon: Rodolph Romano, MD;  Location: [facility] ORS;  Service: General;  Laterality: Bilateral;   TUBAL LIGATION  02/09/1976   ULNAR NERVE TRANSPOSITION  02/08/2006   US  LT BREAST BX W LOC DEV 1ST LESION IMG BX SPEC US  GUIDE Left    US  BX LEFT BREAST RIBBON CLIP-PATH PENDING    Social History   Socioeconomic History   Marital status: Married    Spouse name: Garnette   Number of children: 2   Years of education: Not on file   Highest education level: Bachelor's degree (e.g., BA, AB, BS)  Occupational History   Occupation: disabled    Comment: retired  Tobacco Use   Smoking status: Some Days    Current packs/day: 0.00  Average packs/day: 0.5 packs/day for 48.3 years (24.2 ttl pk-yrs)    Types: Cigarettes    Start date: 02/08/1974    Last attempt to quit: 05/31/2022    Years since quitting: 1.7   Smokeless tobacco: Never  Vaping Use   Vaping status: Never Used  Substance and Sexual Activity   Alcohol use: No    Alcohol/week: 0.0 standard drinks of alcohol   Drug use: No   Sexual activity: Yes    Birth control/protection: None  Other Topics Concern   Not on file  Social History Narrative   Lives at home with stepson and husband, dogs and cats   Caffeine  Drinks sweet tea.   Right handed.    Social Drivers of Health   Tobacco Use: High Risk (02/16/2024)   Patient History    Smoking Tobacco Use: Some Days    Smokeless Tobacco Use: Never    Passive Exposure: Not on file  Financial Resource Strain: Low Risk  (01/03/2024)   Received from [facility]   Overall Financial Resource Strain (CARDIA)    Difficulty of Paying Living Expenses: Not hard at all   Food Insecurity: No Food Insecurity (01/18/2024)   Epic    Worried About Running Out of Food in the Last Year: Never true    Ran Out of Food in the Last Year: Never true  Transportation Needs: No Transportation Needs (01/18/2024)   Epic    Lack of Transportation (Medical): No    Lack of Transportation (Non-Medical): No  Physical Activity: Inactive (09/08/2023)   Exercise Vital Sign    Days of Exercise per Week: 0 days    Minutes of Exercise per Session: Not on file  Stress: No Stress Concern Present (09/08/2023)   [employer] of Occupational Health - Occupational Stress Questionnaire    Feeling of Stress: Only a little  Social Connections: Moderately Isolated (01/18/2024)   Social Connection and Isolation Panel    Frequency of Communication with Friends and Family: More than three times a week    Frequency of Social Gatherings with Friends and Family: Twice a week    Attends Religious Services: Never    Database Administrator or Organizations: No    Attends Banker Meetings: Never    Marital Status: Married  Catering Manager Violence: Not At Risk (01/18/2024)   Epic    Fear of Current or Ex-Partner: No    Emotionally Abused: No    Physically Abused: No    Sexually Abused: No  Depression (PHQ2-9): Low Risk (02/16/2024)   Depression (PHQ2-9)    PHQ-2 Score: 0  Alcohol Screen: Low Risk (12/04/2021)   Alcohol Screen    Last Alcohol Screening Score (AUDIT): 0  Housing: Low Risk  (02/13/2024)   Received from [facility]   Epic    In the last 12 months, was there a time when you were not able to pay the mortgage or rent on time?: No    In the past 12 months, how many times have you moved where you were living?: 0    At any time in the past 12 months, were you homeless or living in a shelter (including now)?: No  Utilities: Not At Risk (01/18/2024)   Epic    Threatened with loss of utilities: No  Health Literacy: Not on file    Family History   Problem Relation Age of Onset   Heart disease Mother  died from CHF   Asthma Mother    Diabetes Mother    Heart disease Father    Aneurysm Father    COPD Brother    Diabetes Brother    Alcohol abuse Paternal Aunt    Cancer Maternal Grandmother        unknownorigin   Anemia Neg Hx    Arrhythmia Neg Hx    Clotting disorder Neg Hx    Fainting Neg Hx    Heart attack Neg Hx    Heart failure Neg Hx    Hyperlipidemia Neg Hx    Hypertension Neg Hx     Current Medications[2]  NM PET Image Initial (PI) Skull Base To Thigh Result Date: 02/10/2024 EXAM: PET AND CT SKULL BASE TO MID THIGH 02/10/2024 02:16:17 PM TECHNIQUE: RADIOPHARMACEUTICAL: 11.49 mCi F-18 FDG Uptake time 60 minutes. Glucose level 178 mg/dl. PET imaging was acquired from the base of the skull to the mid thighs. Non-contrast enhanced computed tomography was obtained for attenuation correction and anatomic localization. COMPARISON: Lung cancer screening CT from 05/25/20xx. CLINICAL HISTORY: Cancer. FINDINGS: HEAD AND NECK: Asymmetric opacification of the left maxillary sinus with chronic mucoperiosteal thickening. No metabolically active cervical lymphadenopathy. CHEST: Cardiac enlargement. Aortic atherosclerosis. Coronary artery calcifications. Postoperative changes from recent bilateral total mastectomy. There is diffuse increased tracer uptake within the superficial soft tissues of the bilateral ventral chest wall consistent with postoperative change. Bilateral chest wall fluid collections compatible with postoperative seromas. There are still a few small foci of gas identified within the operative site. No tracer avid chest wall mass identified. No hypermetabolic lung nodules. No metabolically active lymphadenopathy (mediastinal, hilar, or axillary). ABDOMEN AND PELVIS: No tracer avid lesions identified within the liver, pancreas, spleen, or adrenal glands. No tracer avid abdominal or pelvic lymph nodes. Atherosclerotic  calcification involving the abdominal aorta. Gallstones. Fat-containing umbilical hernia, small. No ascites. No metabolically active intraperitoneal mass. Physiologic activity within the gastrointestinal and genitourinary systems. BONES AND SOFT TISSUE: Compression fracture is identified at T12 without increased uptake, likely chronic. Appears similar when compared with lung cancer screening CT from 07/03/2023. Vascular stent noted in the left upper extremity. No abnormal FDG activity localizes to the bones. No metabolically active aggressive osseous lesion. Other findings include cardiac enlargement, aortic atherosclerosis, coronary artery calcifications, a vascular stent in the left upper extremity, asymmetric opacification of the left maxillary sinus with chronic mucoperiosteal thickening, gallstones, a small fat-containing umbilical hernia, and a chronic compression fracture at T12 without increased uptake. IMPRESSION: 1. No evidence of FDG-avid malignancy. 2. Postoperative changes from bilateral mastectomy with bilateral chest wall seromas and expected postoperative FDG uptake; superimposed infection is not excluded, and targeted ultrasound or contrast-enhanced CT is suggested if there is clinical concern. Electronically signed by: Waddell Calk MD 02/10/2024 03:05 PM EST RP Workstation: [id number]    No images are attached to the encounter.      Latest Ref Rng & Units 02/15/2024    7:57 AM  CMP  Glucose 70 - 99 mg/dL 797   BUN 8 - 23 mg/dL 29   Creatinine 9.55 - 1.00 mg/dL 6.64   Sodium 864 - 854 mmol/L 139   Potassium 3.5 - 5.1 mmol/L 3.6   Chloride 98 - 111 mmol/L 101   CO2 22 - 32 mmol/L 27   Calcium  8.9 - 10.3 mg/dL 9.6       Latest Ref Rng & Units 02/15/2024    7:57 AM  CBC  WBC 4.0 - 10.5 K/uL 14.4  Hemoglobin 12.0 - 15.0 g/dL 89.5   Hematocrit 63.9 - 46.0 % 33.7   Platelets 150 - 400 K/uL 601      Observation/objective: Frail-appearing.  She is in no acute  distress  Assessment and plan: Patient is a 70 year old female with history of JAK2 positive ET on Hydrea , newly diagnosed stage I left breast cancer ER/PR positive HER2 negative status post bilateral mastectomy.  This is discussion for further management of her breast cancer  During my last visit on 02/04/2024 I had discussed that patient had a fairly large left breast cancer 7.2 cm with 1 sentinel lymph node involvement.  We had discussed adjuvant TC chemotherapy and that was the plan.  However patient had right chest wall abscess following mastectomy and overall is much weaker since then.  She has had a few falls at home.  Moreover she has end-stage renal disease and is on dialysis.  I am therefore planning to hold off on giving her any chemotherapy at this time and I will plan to see her in 2 weeks in person before deciding if she would be a candidate for chemotherapy.  Have also reached out to Dr. Cesar to hold off on port placement.  I may ultimately decide to start her on endocrine therapy alone without chemotherapy  Follow-up instructions: See me in clinic in 2 weeks time  I discussed the assessment and treatment plan with the patient. The patient was provided an opportunity to ask questions and all were answered. The patient agreed with the plan and demonstrated an understanding of the instructions.   The patient was advised to call back or seek an in-person evaluation if the symptoms worsen or if the condition fails to improve as anticipated.  I provided 16 minutes of face-to-face video visit time during this encounter, and > 50% was spent counseling as documented under my assessment & plan.  Visit Diagnosis: 1. Malignant neoplasm involving both nipple and areola of left breast in female, estrogen receptor positive (HCC)     Dr. Annah Skene, MD, MPH [facility] at [facility] Tel- [phone] 02/18/2024 5:55 PM     [1]  Allergies Allergen Reactions   Ivp Dye  [Iodinated Contrast Media] Anaphylaxis    Kidney Failure   Codeine Nausea And Vomiting  [2]  Current Outpatient Medications:    albuterol  (VENTOLIN  HFA) 108 (90 Base) MCG/ACT inhaler, TAKE 2 PUFFS BY MOUTH EVERY 6 HOURS AS NEEDED FOR WHEEZE OR SHORTNESS OF BREATH, Disp: 8.5 each, Rfl: 2   atorvastatin  (LIPITOR) 40 MG tablet, Take 1 tablet (40 mg total) by mouth daily., Disp: 90 tablet, Rfl: 3   buPROPion  (WELLBUTRIN  SR) 150 MG 12 hr tablet, Take 1 tablet (150 mg total) by mouth 2 (two) times daily., Disp: 180 tablet, Rfl: 3   calcium  carbonate (TUMS EX) 750 MG chewable tablet, Chew 1 tablet by mouth daily., Disp: , Rfl:    dexamethasone  (DECADRON ) 4 MG tablet, Take 2 tabs by mouth 2 times daily starting day before chemo. Then take 2 tabs daily for 2 days starting day after chemo. Take with food., Disp: 30 tablet, Rfl: 1   ELIQUIS  5 MG TABS tablet, TAKE 1 TABLET TWICE A DAY, Disp: 180 tablet, Rfl: 2   fluticasone  (FLONASE ) 50 MCG/ACT nasal spray, Place 2 sprays into both nostrils daily. (Patient taking differently: Place 2 sprays into both nostrils daily as needed for allergies.), Disp: 16 g, Rfl: 6   gabapentin  (NEURONTIN ) 300 MG capsule, Take 300 mg by  mouth daily., Disp: , Rfl:    hydroxyurea  (HYDREA ) 500 MG capsule, TAKE 1 CAPSULE BY MOUTH EVERY OTHER DAY. MAY TAKE WITH FOOD TO MINIMIZE GI SIDE EFFECTS. (Patient taking differently: Take 500 mg by mouth See admin instructions. Take 500 mg by mouth 6 days a week), Disp: 45 capsule, Rfl: 1   insulin  aspart (NOVOLOG ) 100 UNIT/ML injection, Insulin  pump, Disp: , Rfl:    Insulin  Disposable Pump (OMNIPOD DASH PODS, GEN 4,) MISC, USE 1 POD EACH EVERY 72    HOURS, Disp: , Rfl:    Insulin  Human (INSULIN  PUMP) SOLN, Per endocrinoloy, Disp: , Rfl:    loratadine  (CLARITIN ) 10 MG tablet, Take 10 mg by mouth daily., Disp: , Rfl:    meclizine  (ANTIVERT ) 12.5 MG tablet, Take 1 tablet (12.5 mg total) by mouth 3 (three) times daily as needed for dizziness., Disp:  30 tablet, Rfl: 0   ondansetron  (ZOFRAN ) 8 MG tablet, Take 1 tablet (8 mg total) by mouth every 8 (eight) hours as needed for nausea or vomiting. Start on the third day after chemotherapy., Disp: 30 tablet, Rfl: 1   OXYGEN , Inhale 2 L into the lungs at bedtime., Disp: , Rfl:    PARoxetine  (PAXIL ) 40 MG tablet, Take 1 tablet (40 mg total) by mouth daily., Disp: 90 tablet, Rfl: 3   prochlorperazine  (COMPAZINE ) 10 MG tablet, Take 1 tablet (10 mg total) by mouth every 6 (six) hours as needed for nausea or vomiting., Disp: 30 tablet, Rfl: 1   torsemide  (DEMADEX ) 20 MG tablet, TAKE 3 TABLETS BY MOUTH EVERY DAY (Patient taking differently: Take 40 mg by mouth daily.), Disp: 270 tablet, Rfl: 1   traMADol  (ULTRAM ) 50 MG tablet, Take 1 tablet (50 mg total) by mouth 2 (two) times daily as needed., Disp: 60 tablet, Rfl: 2   traMADol  (ULTRAM ) 50 MG tablet, Take 1 tablet (50 mg total) by mouth every 6 (six) hours as needed., Disp: 10 tablet, Rfl: 0   Vitamin D , Ergocalciferol , (DRISDOL ) 1.25 MG (50000 UNIT) CAPS capsule, Take 50,000 Units by mouth once a week., Disp: , Rfl:    lidocaine -prilocaine  (EMLA ) cream, Apply to affected area once (Patient not taking: Reported on 02/16/2024), Disp: 30 g, Rfl: 3   Spacer/Aero-Holding Chambers (OPTICHAMBER DIAMOND ) MISC, Use with inhaler to ensure medication is delivered throughout lungs (Patient not taking: Reported on 02/16/2024), Disp: 1 each, Rfl: 0

## 2024-02-21 ENCOUNTER — Inpatient Hospital Stay

## 2024-02-21 ENCOUNTER — Inpatient Hospital Stay: Admitting: Oncology

## 2024-02-23 ENCOUNTER — Inpatient Hospital Stay

## 2024-03-06 ENCOUNTER — Inpatient Hospital Stay: Admitting: Oncology

## 2024-03-06 ENCOUNTER — Inpatient Hospital Stay

## 2024-03-06 ENCOUNTER — Encounter: Payer: Self-pay | Admitting: Oncology

## 2024-03-06 VITALS — BP 135/59 | HR 75 | Temp 97.6°F | Resp 19 | Wt 215.6 lb

## 2024-03-06 DIAGNOSIS — Z1732 Human epidermal growth factor receptor 2 negative status: Secondary | ICD-10-CM | POA: Diagnosis not present

## 2024-03-06 DIAGNOSIS — C50012 Malignant neoplasm of nipple and areola, left female breast: Secondary | ICD-10-CM | POA: Diagnosis not present

## 2024-03-06 DIAGNOSIS — Z79899 Other long term (current) drug therapy: Secondary | ICD-10-CM | POA: Diagnosis not present

## 2024-03-06 DIAGNOSIS — Z992 Dependence on renal dialysis: Secondary | ICD-10-CM | POA: Diagnosis not present

## 2024-03-06 DIAGNOSIS — M810 Age-related osteoporosis without current pathological fracture: Secondary | ICD-10-CM | POA: Diagnosis not present

## 2024-03-06 DIAGNOSIS — N186 End stage renal disease: Secondary | ICD-10-CM | POA: Diagnosis not present

## 2024-03-06 DIAGNOSIS — Z1589 Genetic susceptibility to other disease: Secondary | ICD-10-CM | POA: Diagnosis not present

## 2024-03-06 DIAGNOSIS — Z170 Estrogen receptor positive status [ER+]: Secondary | ICD-10-CM | POA: Diagnosis not present

## 2024-03-06 DIAGNOSIS — D473 Essential (hemorrhagic) thrombocythemia: Secondary | ICD-10-CM

## 2024-03-06 DIAGNOSIS — F17210 Nicotine dependence, cigarettes, uncomplicated: Secondary | ICD-10-CM | POA: Diagnosis not present

## 2024-03-06 DIAGNOSIS — Z809 Family history of malignant neoplasm, unspecified: Secondary | ICD-10-CM | POA: Diagnosis not present

## 2024-03-06 DIAGNOSIS — Z1721 Progesterone receptor positive status: Secondary | ICD-10-CM | POA: Diagnosis not present

## 2024-03-06 DIAGNOSIS — D631 Anemia in chronic kidney disease: Secondary | ICD-10-CM | POA: Diagnosis not present

## 2024-03-06 DIAGNOSIS — C50011 Malignant neoplasm of nipple and areola, right female breast: Secondary | ICD-10-CM

## 2024-03-06 LAB — CBC WITH DIFFERENTIAL (CANCER CENTER ONLY)
Abs Immature Granulocytes: 0.04 10*3/uL (ref 0.00–0.07)
Basophils Absolute: 0.1 10*3/uL (ref 0.0–0.1)
Basophils Relative: 1 %
Eosinophils Absolute: 0.3 10*3/uL (ref 0.0–0.5)
Eosinophils Relative: 3 %
HCT: 33.3 % — ABNORMAL LOW (ref 36.0–46.0)
Hemoglobin: 9.8 g/dL — ABNORMAL LOW (ref 12.0–15.0)
Immature Granulocytes: 0 %
Lymphocytes Relative: 8 %
Lymphs Abs: 0.7 10*3/uL (ref 0.7–4.0)
MCH: 29.5 pg (ref 26.0–34.0)
MCHC: 29.4 g/dL — ABNORMAL LOW (ref 30.0–36.0)
MCV: 100.3 fL — ABNORMAL HIGH (ref 80.0–100.0)
Monocytes Absolute: 0.5 10*3/uL (ref 0.1–1.0)
Monocytes Relative: 6 %
Neutro Abs: 8 10*3/uL — ABNORMAL HIGH (ref 1.7–7.7)
Neutrophils Relative %: 82 %
Platelet Count: 539 10*3/uL — ABNORMAL HIGH (ref 150–400)
RBC: 3.32 MIL/uL — ABNORMAL LOW (ref 3.87–5.11)
RDW: 16.4 % — ABNORMAL HIGH (ref 11.5–15.5)
WBC Count: 9.6 10*3/uL (ref 4.0–10.5)
nRBC: 0 % (ref 0.0–0.2)

## 2024-03-06 LAB — CMP (CANCER CENTER ONLY)
ALT: 9 U/L (ref 0–44)
AST: 18 U/L (ref 15–41)
Albumin: 3.7 g/dL (ref 3.5–5.0)
Alkaline Phosphatase: 82 U/L (ref 38–126)
Anion gap: 13 (ref 5–15)
BUN: 48 mg/dL — ABNORMAL HIGH (ref 8–23)
CO2: 25 mmol/L (ref 22–32)
Calcium: 6.7 mg/dL — ABNORMAL LOW (ref 8.9–10.3)
Chloride: 103 mmol/L (ref 98–111)
Creatinine: 3.95 mg/dL — ABNORMAL HIGH (ref 0.44–1.00)
GFR, Estimated: 12 mL/min — ABNORMAL LOW
Glucose, Bld: 329 mg/dL — ABNORMAL HIGH (ref 70–99)
Potassium: 3.9 mmol/L (ref 3.5–5.1)
Sodium: 141 mmol/L (ref 135–145)
Total Bilirubin: <0.2 mg/dL (ref 0.0–1.2)
Total Protein: 6.3 g/dL — ABNORMAL LOW (ref 6.5–8.1)

## 2024-03-06 MED ORDER — ANASTROZOLE 1 MG PO TABS: 1.0000 mg | ORAL_TABLET | Freq: Every day | ORAL | 2 refills | Status: AC

## 2024-03-06 NOTE — Progress Notes (Signed)
 "    Hematology/Oncology Consult note [facility]  [phone] [phone]  Patient Care Team: Donzella Lauraine SAILOR, DO as PCP - General (Family Medicine) Darliss Rogue, MD as PCP - Cardiology (Cardiology) Solum, Therisa HERO, MD as Physician Assistant (Endocrinology) Mohammed Goldstein, MD (Inactive) as Referring Physician (Psychiatry) Hester Alm BROCKS, MD as Consulting Physician (Dermatology) Dimmig, Debby, MD as Referring Physician (Orthopedic Surgery) Schnier, Cordella MATSU, MD (Vascular Surgery) Erasmo Bernardino BRAVO, OD (Optometry) Tamea Dedra CROME, MD as Consulting Physician (Pulmonary Disease) Melanee Annah BROCKS, MD as Consulting Physician (Oncology) Georgina Shasta POUR, RN as Oncology Nurse Navigator Rodolph Romano, MD as Consulting Physician (General Surgery) Douglas Rule, MD as Consulting Physician (Nephrology) Neill Boas, DPM (Podiatry) Patel, Seema K, NP as Nurse Practitioner (Pain Medicine) Gerard Frederick, NP as Nurse Practitioner (Cardiology)   Name of the patient: Jocelyn Wells  [id number]  01-29-1956   Date of visit: 03/06/24  Diagnosis- 1.  Pathological prognostic stage Ib invasive mammary carcinoma of the left breast pT3 N1a M0 ER/PR +100% HER2 negative 2.  JAK2 positive essential thrombocytosis 3.  History of iron  deficiency anemia and anemia of chronic kidney disease  Chief complaint/ Reason for visit-routine follow-up of above issues  Heme/Onc history: Patient is a 69 year old female with a past medical history significant for ESRD on dialysis, hypertension hyperlipidemia COPD, history of stroke currently on Eliquis .  She has seen me in the past for iron  deficiency anemia and has required IV iron .Patient had an EGD and colonoscopy in January 2024 by Dr. Unk we did not show any evidence of bleeding.  Endoscopy revealed mild chronic gastritis that was negative for H. pylori.She also had a capsule study which did not show any active  bleeding   Patient was also diagnosed with JAK2 positive thrombocytosis likely essential thrombocytosis for which she is on Hydrea    Patient self palpated a left breast mass which led to bilateral diagnostic mammogram On 12/14/2023.  This did not show any evidence of malignancy in the right breast but she was found to have multifocal left breast malignancy.  17 x 19 x 18 mm mass at the 4 o'clock position 3 cm from the nipple.  11 x 8 x 7 mm mass at the 8 o'clock position 3 cm from the nipple.  8 mm mass 4 cm from the nipple at the 9:30 position.  10 mm irregular hypoechoic mass at 9 o'clock position 7 cm from the nipple.  Targeted ultrasound of the left axilla did not show any evidence of abnormal lymph nodes.  Patient had a core biopsy of 2 of the left breast masses at the 4:00 and 8 o'clock position and both of them were invasive mammary carcinoma with mucinous and micropapillary features overall grade 2.  Tumor was strongly ER/PR +100% and HER2 +1 negative.   Patient underwent b/ mastectomy without reconstruction on 01/18/24. Right breast pathology showed no malignancy. Left mastectomy pathology showed 7.2 cm grade 2 IDC with negative margins. 1/ 1 SLN positive for malignancy with ENE  Interval history- Jocelyn Wells is a 69 year old female with hormone receptor positive, left-sided breast cancer, end-stage renal disease on dialysis, and essential thrombocythemia who presents for oncology follow-up regarding a non-healing post-mastectomy wound.  She is in the post-operative period following left mastectomy for a 7 cm, hormone receptor positive breast cancer with nodal involvement. The surgical wound remains incompletely healed, with persistent open areas bilaterally and ongoing use of a wound vacuum device. Healing has been slow. She  has not received chemotherapy since surgery in early December.  Final pathology demonstrated a 7 cm tumor with one lymph node positive for malignancy. Her malignancy is  hormone dependent.  She continues regular dialysis for end-stage renal disease, with one missed session due to inclement weather. Hemoglobin remains stable between 9.6-10.4 g/dL, currently 9.8 g/dL, consistent with chronic anemia of renal disease and ongoing wound recovery.  She is maintained on Hydrea  500 mg six days per week for essential thrombocythemia, with persistently elevated platelet counts (most recently 539,000/L). She reports improved stability and fewer falls over the past one to two weeks.      ECOG PS- 3 Pain scale- 0   Review of systems- Review of Systems  Constitutional:  Positive for malaise/fatigue. Negative for chills, fever and weight loss.  HENT:  Negative for congestion, ear discharge and nosebleeds.   Eyes:  Negative for blurred vision.  Respiratory:  Negative for cough, hemoptysis, sputum production, shortness of breath and wheezing.   Cardiovascular:  Negative for chest pain, palpitations, orthopnea and claudication.  Gastrointestinal:  Negative for abdominal pain, blood in stool, constipation, diarrhea, heartburn, melena, nausea and vomiting.  Genitourinary:  Negative for dysuria, flank pain, frequency, hematuria and urgency.  Musculoskeletal:  Negative for back pain, joint pain and myalgias.  Skin:  Negative for rash.  Neurological:  Negative for dizziness, tingling, focal weakness, seizures, weakness and headaches.  Endo/Heme/Allergies:  Does not bruise/bleed easily.  Psychiatric/Behavioral:  Negative for depression and suicidal ideas. The patient does not have insomnia.       Allergies[1]   Past Medical History:  Diagnosis Date   Acute ischemic left MCA stroke (HCC) 01/29/2015   a.) MRI brain 01/29/2015: acute left MCA territory infarcts involving the posterior left frontal lobe and left insular cortex   Allergy    Anemia of chronic renal failure    Anxiety    Aortic atherosclerosis    Arthritis    Complication of anesthesia    a.) PONV; b.)  awareness under anesthesia   COPD (chronic obstructive pulmonary disease) (HCC)    Depression    Dyspnea    ESRD (end stage renal disease) on dialysis (M-W-F)    Headache    HFrEF (heart failure with reduced ejection fraction) (HCC)    a.) TTE 01/30/2015: EF 30-35%, diff HK, LAE, G2DD; b.) TTE 06/13/2015: EF 40-45%, diff HK, G1DD; c.) TTE 10/06/2017: EF 60-65% MAC, no IAS; d.) TTE 09/29/2021: EF 45-50%;, LVH, LAE, AoV sclerosis, G1DD; e.) TTE 02/24/2022: EF 60-65%, no IAS   HTN (hypertension)    Leukocytosis    Malignant neoplasm of left breast (HCC)    NICM (nonischemic cardiomyopathy) (HCC)    a.) TTE 01/30/2015: EF 30-35%; b.) R/LHC 03/25/2015: EF 25-30%, mRA 13, mPA 30, mPCWP 26, LVEDP 17, CO 3.66, CI 1.8; c.) TTE 06/13/2015: EF 40-45%; d.) TTE 10/06/2017: EF 60-65%; e.) TTE 09/29/2021: EF 45-50%; f.) TTE 02/24/2022: EF 60-65%   Obesity    On apixaban  therapy    On supplemental oxygen  by nasal cannula    a.) 2L/[place] at night   Osteoporosis    Peripheral vascular disease    Pneumonia    Right bundle branch block (RBBB) with left anterior fascicular block    Type 2 diabetes mellitus treated with insulin  [facility])      Past Surgical History:  Procedure Laterality Date   ABDOMINAL HYSTERECTOMY     ANKLE FRACTURE SURGERY Left 02/09/2000   AV FISTULA PLACEMENT Left 06/18/2022   Procedure: ARTERIOVENOUS (  AV) FISTULA CREATION (BRACHIAL AXILLA);  Surgeon: Jama Cordella MATSU, MD;  Location: [facility] ORS;  Service: Vascular;  Laterality: Left;   AXILLARY SENTINEL NODE BIOPSY Left 01/18/2024   Procedure: BIOPSY, LYMPH NODE, SENTINEL, AXILLARY;  Surgeon: Rodolph Romano, MD;  Location: [facility];  Service: General;  Laterality: Left;   BILATERAL SALPINGOOPHORECTOMY  02/08/1998   BREAST BIOPSY Left    US  BX LEFT BREAST COIL CLIP-PATH PENDING   BREAST BIOPSY Left 12/20/2023   US  LT BREAST BX W LOC DEV 1ST LESION IMG BX SPEC US  GUIDE 12/20/2023 [facility]   BREAST BIOPSY Left 12/20/2023    US  LT BREAST BX W LOC DEV EA ADD LESION IMG BX SPEC US  GUIDE 12/20/2023 [facility]   CARDIAC CATHETERIZATION N/A 03/25/2015   Procedure: Right/Left Heart Cath and Coronary Angiography;  Surgeon: Victory LELON Sharps, MD;  Location: [facility] INVASIVE CV LAB;  Service: Cardiovascular;  Laterality: N/A;   CESAREAN SECTION  02/09/1976   COLONOSCOPY WITH PROPOFOL  N/A 09/22/2016   Procedure: COLONOSCOPY WITH PROPOFOL ;  Surgeon: Viktoria Lamar DASEN, MD;  Location: [facility] ENDOSCOPY;  Service: Endoscopy;  Laterality: N/A;   COLONOSCOPY WITH PROPOFOL  N/A 02/15/2022   Procedure: COLONOSCOPY WITH PROPOFOL ;  Surgeon: Unk Corinn Skiff, MD;  Location: [facility] ENDOSCOPY;  Service: Gastroenterology;  Laterality: N/A;   DIALYSIS/PERMA CATHETER INSERTION N/A 01/19/2023   Procedure: DIALYSIS/PERMA CATHETER INSERTION;  Surgeon: Marea Selinda RAMAN, MD;  Location: [facility] INVASIVE CV LAB;  Service: Cardiovascular;  Laterality: N/A;   EP IMPLANTABLE DEVICE N/A 08/05/2015   Procedure: Loop Recorder Insertion;  Surgeon: Elspeth JAYSON Sage, MD;  Location: [facility] INVASIVE CV LAB;  Service: Cardiovascular;  Laterality: N/A;   ESOPHAGOGASTRODUODENOSCOPY N/A 02/15/2022   Procedure: ESOPHAGOGASTRODUODENOSCOPY (EGD);  Surgeon: Unk Corinn Skiff, MD;  Location: [facility] ENDOSCOPY;  Service: Gastroenterology;  Laterality: N/A;   ESOPHAGOGASTRODUODENOSCOPY (EGD) WITH PROPOFOL  N/A 09/22/2016   Procedure: ESOPHAGOGASTRODUODENOSCOPY (EGD) WITH PROPOFOL ;  Surgeon: Viktoria Lamar DASEN, MD;  Location: [facility] ENDOSCOPY;  Service: Endoscopy;  Laterality: N/A;   ESOPHAGOGASTRODUODENOSCOPY (EGD) WITH PROPOFOL  N/A 12/08/2016   Procedure: ESOPHAGOGASTRODUODENOSCOPY (EGD) WITH PROPOFOL ;  Surgeon: Viktoria Lamar DASEN, MD;  Location: [facility] ENDOSCOPY;  Service: Endoscopy;  Laterality: N/A;   ESOPHAGOGASTRODUODENOSCOPY (EGD) WITH PROPOFOL  N/A 10/12/2022   Procedure: ESOPHAGOGASTRODUODENOSCOPY (EGD) WITH PROPOFOL ;  Surgeon: Unk Corinn Skiff, MD;  Location: [facility] ENDOSCOPY;  Service:  Gastroenterology;  Laterality: N/A;   FACIAL LACERATION REPAIR N/A 12/27/2022   Procedure: FACIAL LACERATION REPAIR;  Surgeon: Luciano Elspeth, MD;  Location: [facility] OR;  Service: ENT;  Laterality: N/A;   FRACTURE SURGERY     GIVENS CAPSULE STUDY N/A 04/19/2022   Procedure: GIVENS CAPSULE STUDY;  Surgeon: Unk Corinn Skiff, MD;  Location: [facility] ENDOSCOPY;  Service: Gastroenterology;  Laterality: N/A;   GIVENS CAPSULE STUDY N/A 10/12/2022   Procedure: GIVENS CAPSULE STUDY;  Surgeon: Unk Corinn Skiff, MD;  Location: [facility] ENDOSCOPY;  Service: Gastroenterology;  Laterality: N/A;   INCISION AND DRAINAGE, ABSCESS, BREAST Right 02/15/2024   Procedure: INCISION AND DRAINAGE, ABSCESS, BREAST;  Surgeon: Rodolph Romano, MD;  Location: [facility]S;  Service: General;  Laterality: Right;   INSERTION HYBRID ANTERIOVENOUS GORTEX GRAFT  06/18/2022   Procedure: INSERTION HYBRID ANTERIOVENOUS GORTEX GRAFT;  Surgeon: Jama Cordella MATSU, MD;  Location: [facility]S;  Service: Vascular;;   KNEE ARTHROSCOPY Left 02/09/2003   LOWER EXTREMITY ANGIOGRAPHY Left 08/12/2021   Procedure: Lower Extremity Angiography;  Surgeon: Jama Cordella MATSU, MD;  Location: [facility] INVASIVE CV LAB;  Service: Cardiovascular;  Laterality: Left;   PERIPHERAL VASCULAR THROMBECTOMY Left 01/19/2023  Procedure: PERIPHERAL VASCULAR THROMBECTOMY;  Surgeon: Marea Selinda RAMAN, MD;  Location: [facility] INVASIVE CV LAB;  Service: Cardiovascular;  Laterality: Left;   TEE WITHOUT CARDIOVERSION N/A 01/31/2015   Procedure: TRANSESOPHAGEAL ECHOCARDIOGRAM (TEE);  Surgeon: Redell RAMAN Shallow, MD;  Location: [facility] ENDOSCOPY;  Service: Cardiovascular;  Laterality: N/A;   TEMPORARY DIALYSIS CATHETER N/A 08/14/2021   Procedure: TEMPORARY DIALYSIS CATHETER;  Surgeon: Jama Cordella MATSU, MD;  Location: [facility] INVASIVE CV LAB;  Service: Cardiovascular;  Laterality: N/A;   TOTAL MASTECTOMY Bilateral 01/18/2024   Procedure: MASTECTOMY, SIMPLE;  Surgeon: Rodolph Romano, MD;   Location: [facility] ORS;  Service: General;  Laterality: Bilateral;   TUBAL LIGATION  02/09/1976   ULNAR NERVE TRANSPOSITION  02/08/2006   US  LT BREAST BX W LOC DEV 1ST LESION IMG BX SPEC US  GUIDE Left    US  BX LEFT BREAST RIBBON CLIP-PATH PENDING    Social History   Socioeconomic History   Marital status: Married    Spouse name: Garnette   Number of children: 2   Years of education: Not on file   Highest education level: Bachelor's degree (e.g., BA, AB, BS)  Occupational History   Occupation: disabled    Comment: retired  Tobacco Use   Smoking status: Some Days    Current packs/day: 0.00    Average packs/day: 0.5 packs/day for 48.3 years (24.2 ttl pk-yrs)    Types: Cigarettes    Start date: 02/08/1974    Last attempt to quit: 05/31/2022    Years since quitting: 1.7   Smokeless tobacco: Never  Vaping Use   Vaping status: Never Used  Substance and Sexual Activity   Alcohol use: No    Alcohol/week: 0.0 standard drinks of alcohol   Drug use: No   Sexual activity: Yes    Birth control/protection: None  Other Topics Concern   Not on file  Social History Narrative   Lives at home with stepson and husband, dogs and cats   Caffeine  Drinks sweet tea.   Right handed.    Social Drivers of Health   Tobacco Use: High Risk (03/06/2024)   Patient History    Smoking Tobacco Use: Some Days    Smokeless Tobacco Use: Never    Passive Exposure: Not on file  Financial Resource Strain: Low Risk  (01/03/2024)   Received from [facility]   Overall Financial Resource Strain (CARDIA)    Difficulty of Paying Living Expenses: Not hard at all  Food Insecurity: No Food Insecurity (01/18/2024)   Epic    Worried About Running Out of Food in the Last Year: Never true    Ran Out of Food in the Last Year: Never true  Transportation Needs: No Transportation Needs (01/18/2024)   Epic    Lack of Transportation (Medical): No    Lack of Transportation (Non-Medical): No  Physical  Activity: Inactive (09/08/2023)   Exercise Vital Sign    Days of Exercise per Week: 0 days    Minutes of Exercise per Session: Not on file  Stress: No Stress Concern Present (09/08/2023)   [employer] of Occupational Health - Occupational Stress Questionnaire    Feeling of Stress: Only a little  Social Connections: Moderately Isolated (01/18/2024)   Social Connection and Isolation Panel    Frequency of Communication with Friends and Family: More than three times a week    Frequency of Social Gatherings with Friends and Family: Twice a week    Attends Religious Services: Never    Active Member of  Clubs or Organizations: No    Attends Banker Meetings: Never    Marital Status: Married  Catering Manager Violence: Not At Risk (01/18/2024)   Epic    Fear of Current or Ex-Partner: No    Emotionally Abused: No    Physically Abused: No    Sexually Abused: No  Depression (PHQ2-9): Low Risk (02/16/2024)   Depression (PHQ2-9)    PHQ-2 Score: 0  Alcohol Screen: Low Risk (12/04/2021)   Alcohol Screen    Last Alcohol Screening Score (AUDIT): 0  Housing: Low Risk  (02/13/2024)   Received from [facility]   Epic    In the last 12 months, was there a time when you were not able to pay the mortgage or rent on time?: No    In the past 12 months, how many times have you moved where you were living?: 0    At any time in the past 12 months, were you homeless or living in a shelter (including now)?: No  Utilities: Not At Risk (01/18/2024)   Epic    Threatened with loss of utilities: No  Health Literacy: Not on file    Family History  Problem Relation Age of Onset   Heart disease Mother        died from CHF   Asthma Mother    Diabetes Mother    Heart disease Father    Aneurysm Father    COPD Brother    Diabetes Brother    Alcohol abuse Paternal Aunt    Cancer Maternal Grandmother        unknownorigin   Anemia Neg Hx    Arrhythmia Neg Hx    Clotting  disorder Neg Hx    Fainting Neg Hx    Heart attack Neg Hx    Heart failure Neg Hx    Hyperlipidemia Neg Hx    Hypertension Neg Hx     Current Medications[2]  Physical exam:  Vitals:   03/06/24 1502 03/06/24 1518  BP: (!) 143/109 (!) 135/59  Pulse: 75   Resp: 19   Temp: 97.6 F (36.4 C)   SpO2: 96%   Weight: 215 lb 9.6 oz (97.8 kg)    Physical Exam Cardiovascular:     Rate and Rhythm: Normal rate and regular rhythm.     Heart sounds: Normal heart sounds.  Pulmonary:     Effort: Pulmonary effort is normal.     Breath sounds: Normal breath sounds.  Skin:    General: Skin is warm and dry.  Neurological:     Mental Status: She is alert and oriented to person, place, and time.   Chest wall exam: Patient is status post bilateral mastectomy without reconstruction.  Wound VAC in place in the center  I have personally reviewed labs listed below:    Latest Ref Rng & Units 03/06/2024    2:40 PM  CMP  Glucose 70 - 99 mg/dL 670   BUN 8 - 23 mg/dL 48   Creatinine 9.55 - 1.00 mg/dL 6.04   Sodium 864 - 854 mmol/L 141   Potassium 3.5 - 5.1 mmol/L 3.9   Chloride 98 - 111 mmol/L 103   CO2 22 - 32 mmol/L 25   Calcium  8.9 - 10.3 mg/dL 6.7   Total Protein 6.5 - 8.1 g/dL 6.3   Total Bilirubin 0.0 - 1.2 mg/dL <9.7   Alkaline Phos 38 - 126 U/L 82   AST 15 - 41 U/L 18   ALT  0 - 44 U/L 9       Latest Ref Rng & Units 03/06/2024    2:40 PM  CBC  WBC 4.0 - 10.5 K/uL 9.6   Hemoglobin 12.0 - 15.0 g/dL 9.8   Hematocrit 63.9 - 46.0 % 33.3   Platelets 150 - 400 K/uL 539    I have personally reviewed Radiology images listed below: No images are attached to the encounter.  NM PET Image Initial (PI) Skull Base To Thigh Result Date: 02/10/2024 EXAM: PET AND CT SKULL BASE TO MID THIGH 02/10/2024 02:16:17 PM TECHNIQUE: RADIOPHARMACEUTICAL: 11.49 mCi F-18 FDG Uptake time 60 minutes. Glucose level 178 mg/dl. PET imaging was acquired from the base of the skull to the mid thighs. Non-contrast  enhanced computed tomography was obtained for attenuation correction and anatomic localization. COMPARISON: Lung cancer screening CT from 05/25/20xx. CLINICAL HISTORY: Cancer. FINDINGS: HEAD AND NECK: Asymmetric opacification of the left maxillary sinus with chronic mucoperiosteal thickening. No metabolically active cervical lymphadenopathy. CHEST: Cardiac enlargement. Aortic atherosclerosis. Coronary artery calcifications. Postoperative changes from recent bilateral total mastectomy. There is diffuse increased tracer uptake within the superficial soft tissues of the bilateral ventral chest wall consistent with postoperative change. Bilateral chest wall fluid collections compatible with postoperative seromas. There are still a few small foci of gas identified within the operative site. No tracer avid chest wall mass identified. No hypermetabolic lung nodules. No metabolically active lymphadenopathy (mediastinal, hilar, or axillary). ABDOMEN AND PELVIS: No tracer avid lesions identified within the liver, pancreas, spleen, or adrenal glands. No tracer avid abdominal or pelvic lymph nodes. Atherosclerotic calcification involving the abdominal aorta. Gallstones. Fat-containing umbilical hernia, small. No ascites. No metabolically active intraperitoneal mass. Physiologic activity within the gastrointestinal and genitourinary systems. BONES AND SOFT TISSUE: Compression fracture is identified at T12 without increased uptake, likely chronic. Appears similar when compared with lung cancer screening CT from 07/03/2023. Vascular stent noted in the left upper extremity. No abnormal FDG activity localizes to the bones. No metabolically active aggressive osseous lesion. Other findings include cardiac enlargement, aortic atherosclerosis, coronary artery calcifications, a vascular stent in the left upper extremity, asymmetric opacification of the left maxillary sinus with chronic mucoperiosteal thickening, gallstones, a small  fat-containing umbilical hernia, and a chronic compression fracture at T12 without increased uptake. IMPRESSION: 1. No evidence of FDG-avid malignancy. 2. Postoperative changes from bilateral mastectomy with bilateral chest wall seromas and expected postoperative FDG uptake; superimposed infection is not excluded, and targeted ultrasound or contrast-enhanced CT is suggested if there is clinical concern. Electronically signed by: Waddell Calk MD 02/10/2024 03:05 PM EST RP Workstation: [id number]     Assessment and plan- Patient is a 69 y.o. female with history of pathological prognostic stage Ib invasive mammary carcinoma of the left breast grade 2 pT3 N1a M0 ER/PR positive HER2 negative here to discuss further management  Assessment and Plan    Hormone receptor positive breast cancer, post-mastectomy, left Post-mastectomy with increased recurrence risk due to tumor size and lymph node involvement. Chemotherapy contraindicated due to comorbidities. Hormonal therapy preferred and agreed upon. - Initiated anastrozole  1 mg daily, prescription sent to pharmacy.  Discussed risks and benefits of Arimidex  including all but not limited to fatigue, hot flashes, mood swings, arthralgias and worsening bone health.  Treatment will be given with a curative intent.  Patient understands and agrees to proceed as planned - Deferred chemotherapy and port placement due to high risk of complications and poor wound healing. - Planned reassessment of tolerance to hormonal therapy at  next visit. - Planned reassessment for radiation therapy in one month. - Will discuss getting bone density scan at my next visit  Non-healing post-mastectomy surgical wound Non-healing wound managed with wound vacuum, delaying chemotherapy and radiation. - Continued wound vacuum therapy. - Deferred chemotherapy and radiation until wound healing improves. - Planned reassessment of wound healing in one month.  End-stage renal disease on  dialysis Renal dysfunction complicates chemotherapy administration, increasing risk of adverse events.  Essential thrombocytosis on hydroxyurea  Chronic thrombocytosis with elevated platelet count, likely due to underlying condition or inflammation. - Continued hydroxyurea  500 mg six days per week.  Ideally goal is to keep her platelets less than 400.  Platelets are still in the 500s but it is unclear if it is due to essential thrombocytosis versus acute phase reaction in the setting of wound healing issues. - Planned reassessment of platelet count in one month. - Deferred dose increase until wound healing and inflammation improve. - She is on Eliquis   Anemia secondary to chronic kidney disease Chronic anemia related to end-stage renal disease, with hemoglobin in expected range. - Planned repeat iron  studies in one month. - Planned reassessment of hemoglobin and anemia management at next visit.         Visit Diagnosis 1. Malignant neoplasm involving both nipple and areola of left breast in female, estrogen receptor positive (HCC)   2. High risk medication use   3. JAK2 gene mutation   4. Essential thrombocytosis (HCC)      Dr. Annah Skene, MD, MPH [facility] [phone] 03/06/2024 3:30 PM                   [1]  Allergies Allergen Reactions   Ivp Dye [Iodinated Contrast Media] Anaphylaxis    Kidney Failure   Codeine Nausea And Vomiting  [2]  Current Outpatient Medications:    albuterol  (VENTOLIN  HFA) 108 (90 Base) MCG/ACT inhaler, TAKE 2 PUFFS BY MOUTH EVERY 6 HOURS AS NEEDED FOR WHEEZE OR SHORTNESS OF BREATH, Disp: 8.5 each, Rfl: 2   atorvastatin  (LIPITOR) 40 MG tablet, Take 1 tablet (40 mg total) by mouth daily., Disp: 90 tablet, Rfl: 3   buPROPion  (WELLBUTRIN  SR) 150 MG 12 hr tablet, Take 1 tablet (150 mg total) by mouth 2 (two) times daily., Disp: 180 tablet, Rfl: 3   calcium  carbonate (TUMS EX) 750 MG chewable tablet, Chew 1  tablet by mouth daily., Disp: , Rfl:    dexamethasone  (DECADRON ) 4 MG tablet, Take 2 tabs by mouth 2 times daily starting day before chemo. Then take 2 tabs daily for 2 days starting day after chemo. Take with food., Disp: 30 tablet, Rfl: 1   ELIQUIS  5 MG TABS tablet, TAKE 1 TABLET TWICE A DAY, Disp: 180 tablet, Rfl: 2   fluticasone  (FLONASE ) 50 MCG/ACT nasal spray, Place 2 sprays into both nostrils daily. (Patient taking differently: Place 2 sprays into both nostrils daily as needed for allergies.), Disp: 16 g, Rfl: 6   gabapentin  (NEURONTIN ) 300 MG capsule, Take 300 mg by mouth daily., Disp: , Rfl:    hydroxyurea  (HYDREA ) 500 MG capsule, TAKE 1 CAPSULE BY MOUTH EVERY OTHER DAY. MAY TAKE WITH FOOD TO MINIMIZE GI SIDE EFFECTS. (Patient taking differently: Take 500 mg by mouth See admin instructions. Take 500 mg by mouth 6 days a week), Disp: 45 capsule, Rfl: 1   insulin  aspart (NOVOLOG ) 100 UNIT/ML injection, Insulin  pump, Disp: , Rfl:    Insulin  Disposable Pump (OMNIPOD DASH PODS,  GEN 4,) MISC, USE 1 POD EACH EVERY 72    HOURS, Disp: , Rfl:    Insulin  Human (INSULIN  PUMP) SOLN, Per endocrinoloy, Disp: , Rfl:    loratadine  (CLARITIN ) 10 MG tablet, Take 10 mg by mouth daily., Disp: , Rfl:    meclizine  (ANTIVERT ) 12.5 MG tablet, Take 1 tablet (12.5 mg total) by mouth 3 (three) times daily as needed for dizziness., Disp: 30 tablet, Rfl: 0   ondansetron  (ZOFRAN ) 8 MG tablet, Take 1 tablet (8 mg total) by mouth every 8 (eight) hours as needed for nausea or vomiting. Start on the third day after chemotherapy., Disp: 30 tablet, Rfl: 1   OXYGEN , Inhale 2 L into the lungs at bedtime., Disp: , Rfl:    PARoxetine  (PAXIL ) 40 MG tablet, Take 1 tablet (40 mg total) by mouth daily., Disp: 90 tablet, Rfl: 3   prochlorperazine  (COMPAZINE ) 10 MG tablet, Take 1 tablet (10 mg total) by mouth every 6 (six) hours as needed for nausea or vomiting., Disp: 30 tablet, Rfl: 1   torsemide  (DEMADEX ) 20 MG tablet, TAKE 3 TABLETS  BY MOUTH EVERY DAY (Patient taking differently: Take 40 mg by mouth daily.), Disp: 270 tablet, Rfl: 1   traMADol  (ULTRAM ) 50 MG tablet, Take 1 tablet (50 mg total) by mouth 2 (two) times daily as needed., Disp: 60 tablet, Rfl: 2   traMADol  (ULTRAM ) 50 MG tablet, Take 1 tablet (50 mg total) by mouth every 6 (six) hours as needed., Disp: 10 tablet, Rfl: 0   Vitamin D , Ergocalciferol , (DRISDOL ) 1.25 MG (50000 UNIT) CAPS capsule, Take 50,000 Units by mouth once a week., Disp: , Rfl:    lidocaine -prilocaine  (EMLA ) cream, Apply to affected area once (Patient not taking: Reported on 03/06/2024), Disp: 30 g, Rfl: 3   Spacer/Aero-Holding Chambers (OPTICHAMBER DIAMOND ) MISC, Use with inhaler to ensure medication is delivered throughout lungs (Patient not taking: Reported on 03/06/2024), Disp: 1 each, Rfl: 0  "

## 2024-03-07 ENCOUNTER — Encounter: Payer: Self-pay | Admitting: *Deleted

## 2024-03-08 ENCOUNTER — Inpatient Hospital Stay

## 2024-03-12 ENCOUNTER — Ambulatory Visit: Admitting: Student in an Organized Health Care Education/Training Program

## 2024-03-19 ENCOUNTER — Encounter: Admitting: Nurse Practitioner

## 2024-03-21 ENCOUNTER — Ambulatory Visit

## 2024-04-03 ENCOUNTER — Inpatient Hospital Stay

## 2024-04-03 ENCOUNTER — Inpatient Hospital Stay: Admitting: Oncology

## 2024-05-04 ENCOUNTER — Ambulatory Visit: Admitting: Cardiology
